# Patient Record
Sex: Female | Born: 1935
Health system: Southern US, Community
[De-identification: ages and names within clinical notes are randomized; demographics above are authoritative.]

## PROBLEM LIST (undated history)

## (undated) DIAGNOSIS — S22000A Wedge compression fracture of unspecified thoracic vertebra, initial encounter for closed fracture: Secondary | ICD-10-CM

## (undated) DIAGNOSIS — Z86010 Personal history of colonic polyps: Secondary | ICD-10-CM

## (undated) DIAGNOSIS — S2231XA Fracture of one rib, right side, initial encounter for closed fracture: Secondary | ICD-10-CM

## (undated) DIAGNOSIS — H34232 Retinal artery branch occlusion, left eye: Secondary | ICD-10-CM

## (undated) DIAGNOSIS — I5042 Chronic combined systolic (congestive) and diastolic (congestive) heart failure: Secondary | ICD-10-CM

## (undated) DIAGNOSIS — Z950 Presence of cardiac pacemaker: Secondary | ICD-10-CM

## (undated) DIAGNOSIS — S72001A Fracture of unspecified part of neck of right femur, initial encounter for closed fracture: Secondary | ICD-10-CM

## (undated) DIAGNOSIS — S92309A Fracture of unspecified metatarsal bone(s), unspecified foot, initial encounter for closed fracture: Secondary | ICD-10-CM

## (undated) DIAGNOSIS — I4891 Unspecified atrial fibrillation: Secondary | ICD-10-CM

## (undated) DIAGNOSIS — E46 Unspecified protein-calorie malnutrition: Secondary | ICD-10-CM

## (undated) DIAGNOSIS — E785 Hyperlipidemia, unspecified: Secondary | ICD-10-CM

## (undated) DIAGNOSIS — I639 Cerebral infarction, unspecified: Secondary | ICD-10-CM

## (undated) DIAGNOSIS — I495 Sick sinus syndrome: Secondary | ICD-10-CM

## (undated) DIAGNOSIS — D638 Anemia in other chronic diseases classified elsewhere: Secondary | ICD-10-CM

## (undated) DIAGNOSIS — Z8679 Personal history of other diseases of the circulatory system: Secondary | ICD-10-CM

## (undated) DIAGNOSIS — H9190 Unspecified hearing loss, unspecified ear: Secondary | ICD-10-CM

## (undated) DIAGNOSIS — I480 Paroxysmal atrial fibrillation: Secondary | ICD-10-CM

## (undated) DIAGNOSIS — M5416 Radiculopathy, lumbar region: Secondary | ICD-10-CM

## (undated) DIAGNOSIS — S32511A Fracture of superior rim of right pubis, initial encounter for closed fracture: Secondary | ICD-10-CM

## (undated) DIAGNOSIS — Z860101 Personal history of adenomatous and serrated colon polyps: Secondary | ICD-10-CM

## (undated) DIAGNOSIS — N261 Atrophy of kidney (terminal): Secondary | ICD-10-CM

## (undated) DIAGNOSIS — M8580 Other specified disorders of bone density and structure, unspecified site: Secondary | ICD-10-CM

## (undated) DIAGNOSIS — M48061 Spinal stenosis, lumbar region without neurogenic claudication: Secondary | ICD-10-CM

## (undated) DIAGNOSIS — N186 End stage renal disease: Secondary | ICD-10-CM

## (undated) DIAGNOSIS — I10 Essential (primary) hypertension: Secondary | ICD-10-CM

## (undated) DIAGNOSIS — M546 Pain in thoracic spine: Secondary | ICD-10-CM

## (undated) DIAGNOSIS — Z87442 Personal history of urinary calculi: Secondary | ICD-10-CM

## (undated) DIAGNOSIS — J9611 Chronic respiratory failure with hypoxia: Secondary | ICD-10-CM

## (undated) DIAGNOSIS — R5381 Other malaise: Secondary | ICD-10-CM

## (undated) DIAGNOSIS — I428 Other cardiomyopathies: Secondary | ICD-10-CM

## (undated) DIAGNOSIS — Z992 Dependence on renal dialysis: Secondary | ICD-10-CM

## (undated) DIAGNOSIS — M47816 Spondylosis without myelopathy or radiculopathy, lumbar region: Secondary | ICD-10-CM

## (undated) HISTORY — DX: Other specified disorders of bone density and structure, unspecified site: M85.80

## (undated) HISTORY — DX: Spinal stenosis, lumbar region without neurogenic claudication: M48.061

## (undated) HISTORY — DX: Other cardiomyopathies: I42.8

## (undated) HISTORY — DX: Chronic combined systolic (congestive) and diastolic (congestive) heart failure: I50.42

## (undated) HISTORY — DX: Paroxysmal atrial fibrillation: I48.0

## (undated) HISTORY — DX: Wedge compression fracture of unspecified thoracic vertebra, initial encounter for closed fracture: S22.000A

## (undated) HISTORY — PX: TRANSTHORACIC ECHOCARDIOGRAM: SHX275

## (undated) HISTORY — DX: End stage renal disease: N18.6

## (undated) HISTORY — PX: APPENDECTOMY: SHX54

## (undated) HISTORY — PX: EYE SURGERY: SHX253

## (undated) HISTORY — DX: Radiculopathy, lumbar region: M54.16

## (undated) HISTORY — DX: Fracture of one rib, right side, initial encounter for closed fracture: S22.31XA

## (undated) HISTORY — DX: Retinal artery branch occlusion, left eye: H34.232

## (undated) HISTORY — DX: Anemia in other chronic diseases classified elsewhere: D63.8

## (undated) HISTORY — DX: Fracture of unspecified metatarsal bone(s), unspecified foot, initial encounter for closed fracture: S92.309A

## (undated) HISTORY — DX: Pain in thoracic spine: M54.6

## (undated) HISTORY — DX: Dependence on renal dialysis: Z99.2

## (undated) HISTORY — DX: Chronic respiratory failure with hypoxia: J96.11

## (undated) HISTORY — PX: TONSILLECTOMY: SUR1361

## (undated) HISTORY — DX: Personal history of colonic polyps: Z86.010

## (undated) HISTORY — DX: Unspecified protein-calorie malnutrition: E46

## (undated) HISTORY — DX: Personal history of adenomatous and serrated colon polyps: Z86.0101

## (undated) HISTORY — DX: Other malaise: R53.81

## (undated) HISTORY — DX: Atrophy of kidney (terminal): N26.1

## (undated) HISTORY — DX: Sick sinus syndrome: I49.5

## (undated) HISTORY — DX: Personal history of other diseases of the circulatory system: Z86.79

---
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
## 2000-03-02 ENCOUNTER — Encounter: Payer: Self-pay | Admitting: *Deleted

## 2000-03-02 ENCOUNTER — Encounter: Admission: RE | Admit: 2000-03-02 | Discharge: 2000-03-02 | Payer: Self-pay | Admitting: *Deleted

## 2001-02-14 ENCOUNTER — Encounter: Payer: Self-pay | Admitting: *Deleted

## 2001-02-14 ENCOUNTER — Encounter: Admission: RE | Admit: 2001-02-14 | Discharge: 2001-02-14 | Payer: Self-pay | Admitting: *Deleted

## 2003-01-12 ENCOUNTER — Encounter: Payer: Self-pay | Admitting: *Deleted

## 2003-01-12 ENCOUNTER — Encounter: Admission: RE | Admit: 2003-01-12 | Discharge: 2003-01-12 | Payer: Self-pay | Admitting: *Deleted

## 2003-11-26 ENCOUNTER — Ambulatory Visit (HOSPITAL_COMMUNITY): Admission: RE | Admit: 2003-11-26 | Discharge: 2003-11-26 | Payer: Self-pay | Admitting: Specialist

## 2004-04-09 ENCOUNTER — Encounter: Admission: RE | Admit: 2004-04-09 | Discharge: 2004-04-09 | Payer: Self-pay | Admitting: *Deleted

## 2004-04-16 ENCOUNTER — Other Ambulatory Visit: Admission: RE | Admit: 2004-04-16 | Discharge: 2004-04-16 | Payer: Self-pay | Admitting: *Deleted

## 2004-04-25 ENCOUNTER — Encounter: Admission: RE | Admit: 2004-04-25 | Discharge: 2004-04-25 | Payer: Self-pay | Admitting: *Deleted

## 2005-01-26 ENCOUNTER — Ambulatory Visit (HOSPITAL_COMMUNITY): Admission: RE | Admit: 2005-01-26 | Discharge: 2005-01-26 | Payer: Self-pay | Admitting: *Deleted

## 2005-01-26 ENCOUNTER — Encounter (INDEPENDENT_AMBULATORY_CARE_PROVIDER_SITE_OTHER): Payer: Self-pay | Admitting: Specialist

## 2005-06-19 ENCOUNTER — Encounter: Admission: RE | Admit: 2005-06-19 | Discharge: 2005-06-19 | Payer: Self-pay | Admitting: *Deleted

## 2006-07-27 ENCOUNTER — Encounter: Admission: RE | Admit: 2006-07-27 | Discharge: 2006-07-27 | Payer: Self-pay | Admitting: *Deleted

## 2007-08-05 ENCOUNTER — Encounter: Admission: RE | Admit: 2007-08-05 | Discharge: 2007-08-05 | Payer: Self-pay | Admitting: Family Medicine

## 2007-08-12 ENCOUNTER — Other Ambulatory Visit: Admission: RE | Admit: 2007-08-12 | Discharge: 2007-08-12 | Payer: Self-pay | Admitting: Family Medicine

## 2007-08-24 ENCOUNTER — Encounter: Admission: RE | Admit: 2007-08-24 | Discharge: 2007-08-24 | Payer: Self-pay | Admitting: Family Medicine

## 2008-09-14 ENCOUNTER — Encounter: Admission: RE | Admit: 2008-09-14 | Discharge: 2008-09-14 | Payer: Self-pay | Admitting: Family Medicine

## 2009-10-11 ENCOUNTER — Encounter: Admission: RE | Admit: 2009-10-11 | Discharge: 2009-10-11 | Payer: Self-pay | Admitting: Family Medicine

## 2009-11-22 ENCOUNTER — Encounter: Admission: RE | Admit: 2009-11-22 | Discharge: 2009-11-22 | Payer: Self-pay | Admitting: Family Medicine

## 2010-05-18 DIAGNOSIS — M5416 Radiculopathy, lumbar region: Secondary | ICD-10-CM

## 2010-05-18 HISTORY — DX: Radiculopathy, lumbar region: M54.16

## 2010-06-07 ENCOUNTER — Encounter: Payer: Self-pay | Admitting: *Deleted

## 2010-09-01 ENCOUNTER — Other Ambulatory Visit: Payer: Self-pay | Admitting: Family Medicine

## 2010-09-01 DIAGNOSIS — Z1231 Encounter for screening mammogram for malignant neoplasm of breast: Secondary | ICD-10-CM

## 2010-10-14 ENCOUNTER — Ambulatory Visit
Admission: RE | Admit: 2010-10-14 | Discharge: 2010-10-14 | Disposition: A | Discharge status: Home / Self Care | Payer: BC Managed Care – PPO | Source: Ambulatory Visit | Attending: Family Medicine | Admitting: Family Medicine

## 2010-10-14 DIAGNOSIS — Z1231 Encounter for screening mammogram for malignant neoplasm of breast: Secondary | ICD-10-CM

## 2011-02-04 DIAGNOSIS — M5416 Radiculopathy, lumbar region: Secondary | ICD-10-CM | POA: Insufficient documentation

## 2011-11-17 ENCOUNTER — Other Ambulatory Visit: Payer: Self-pay | Admitting: Internal Medicine

## 2011-11-17 DIAGNOSIS — Z1231 Encounter for screening mammogram for malignant neoplasm of breast: Secondary | ICD-10-CM

## 2011-12-02 ENCOUNTER — Ambulatory Visit
Admission: RE | Admit: 2011-12-02 | Discharge: 2011-12-02 | Disposition: A | Discharge status: Home / Self Care | Payer: BC Managed Care – PPO | Source: Ambulatory Visit | Attending: Internal Medicine | Admitting: Internal Medicine

## 2011-12-02 DIAGNOSIS — Z1231 Encounter for screening mammogram for malignant neoplasm of breast: Secondary | ICD-10-CM

## 2012-05-18 DIAGNOSIS — M8580 Other specified disorders of bone density and structure, unspecified site: Secondary | ICD-10-CM

## 2012-05-18 HISTORY — DX: Other specified disorders of bone density and structure, unspecified site: M85.80

## 2012-11-21 ENCOUNTER — Other Ambulatory Visit: Payer: Self-pay

## 2012-11-21 DIAGNOSIS — Z1231 Encounter for screening mammogram for malignant neoplasm of breast: Secondary | ICD-10-CM

## 2012-12-19 ENCOUNTER — Ambulatory Visit
Admission: RE | Admit: 2012-12-19 | Discharge: 2012-12-19 | Disposition: A | Discharge status: Home / Self Care | Payer: BC Managed Care – PPO | Source: Ambulatory Visit

## 2012-12-19 DIAGNOSIS — Z1231 Encounter for screening mammogram for malignant neoplasm of breast: Secondary | ICD-10-CM

## 2013-05-18 HISTORY — PX: COLONOSCOPY: SHX174

## 2013-07-10 DIAGNOSIS — K635 Polyp of colon: Secondary | ICD-10-CM | POA: Insufficient documentation

## 2013-12-25 ENCOUNTER — Other Ambulatory Visit: Payer: Self-pay

## 2013-12-25 DIAGNOSIS — Z1231 Encounter for screening mammogram for malignant neoplasm of breast: Secondary | ICD-10-CM

## 2013-12-27 ENCOUNTER — Ambulatory Visit
Admission: RE | Admit: 2013-12-27 | Discharge: 2013-12-27 | Disposition: A | Discharge status: Home / Self Care | Payer: No Typology Code available for payment source | Source: Ambulatory Visit

## 2013-12-27 ENCOUNTER — Encounter (INDEPENDENT_AMBULATORY_CARE_PROVIDER_SITE_OTHER): Payer: Self-pay

## 2013-12-27 DIAGNOSIS — Z1231 Encounter for screening mammogram for malignant neoplasm of breast: Secondary | ICD-10-CM

## 2014-01-08 DIAGNOSIS — I059 Rheumatic mitral valve disease, unspecified: Secondary | ICD-10-CM | POA: Insufficient documentation

## 2014-10-17 DIAGNOSIS — Z8679 Personal history of other diseases of the circulatory system: Secondary | ICD-10-CM

## 2014-10-17 HISTORY — DX: Personal history of other diseases of the circulatory system: Z86.79

## 2014-12-05 ENCOUNTER — Other Ambulatory Visit: Payer: Self-pay

## 2014-12-05 DIAGNOSIS — Z1231 Encounter for screening mammogram for malignant neoplasm of breast: Secondary | ICD-10-CM

## 2015-01-02 ENCOUNTER — Inpatient Hospital Stay: Admission: RE | Admit: 2015-01-02 | Payer: No Typology Code available for payment source | Source: Ambulatory Visit

## 2015-01-02 ENCOUNTER — Ambulatory Visit
Admission: RE | Admit: 2015-01-02 | Discharge: 2015-01-02 | Disposition: A | Discharge status: Home / Self Care | Payer: No Typology Code available for payment source | Source: Ambulatory Visit

## 2015-01-02 DIAGNOSIS — Z1231 Encounter for screening mammogram for malignant neoplasm of breast: Secondary | ICD-10-CM

## 2015-05-19 DIAGNOSIS — H34232 Retinal artery branch occlusion, left eye: Secondary | ICD-10-CM

## 2015-05-19 HISTORY — DX: Retinal artery branch occlusion, left eye: H34.232

## 2016-01-13 ENCOUNTER — Other Ambulatory Visit: Payer: Self-pay | Admitting: Family Medicine

## 2016-01-13 DIAGNOSIS — Z1231 Encounter for screening mammogram for malignant neoplasm of breast: Secondary | ICD-10-CM

## 2016-01-17 DIAGNOSIS — I639 Cerebral infarction, unspecified: Secondary | ICD-10-CM

## 2016-01-17 HISTORY — DX: Cerebral infarction, unspecified: I63.9

## 2016-01-24 ENCOUNTER — Ambulatory Visit
Admission: RE | Admit: 2016-01-24 | Discharge: 2016-01-24 | Disposition: A | Discharge status: Home / Self Care | Payer: Medicare Other | Source: Ambulatory Visit | Attending: Family Medicine | Admitting: Family Medicine

## 2016-01-24 DIAGNOSIS — Z1231 Encounter for screening mammogram for malignant neoplasm of breast: Secondary | ICD-10-CM

## 2016-01-27 DIAGNOSIS — R269 Unspecified abnormalities of gait and mobility: Secondary | ICD-10-CM | POA: Insufficient documentation

## 2016-01-28 ENCOUNTER — Emergency Department (HOSPITAL_COMMUNITY): Payer: Medicare Other

## 2016-01-28 ENCOUNTER — Encounter (HOSPITAL_COMMUNITY): Payer: Self-pay | Admitting: Emergency Medicine

## 2016-01-28 ENCOUNTER — Inpatient Hospital Stay (HOSPITAL_COMMUNITY)
Admission: EM | Admit: 2016-01-28 | Discharge: 2016-02-01 | DRG: 065 | Disposition: A | Discharge status: Home / Self Care | Payer: Medicare Other | Attending: Internal Medicine | Admitting: Internal Medicine

## 2016-01-28 DIAGNOSIS — R079 Chest pain, unspecified: Secondary | ICD-10-CM | POA: Diagnosis not present

## 2016-01-28 DIAGNOSIS — R0789 Other chest pain: Secondary | ICD-10-CM | POA: Diagnosis not present

## 2016-01-28 DIAGNOSIS — R001 Bradycardia, unspecified: Secondary | ICD-10-CM | POA: Diagnosis present

## 2016-01-28 DIAGNOSIS — I639 Cerebral infarction, unspecified: Secondary | ICD-10-CM | POA: Diagnosis not present

## 2016-01-28 DIAGNOSIS — I700 Atherosclerosis of aorta: Secondary | ICD-10-CM | POA: Diagnosis present

## 2016-01-28 DIAGNOSIS — H34232 Retinal artery branch occlusion, left eye: Secondary | ICD-10-CM | POA: Diagnosis not present

## 2016-01-28 DIAGNOSIS — Z7982 Long term (current) use of aspirin: Secondary | ICD-10-CM

## 2016-01-28 DIAGNOSIS — E669 Obesity, unspecified: Secondary | ICD-10-CM | POA: Diagnosis present

## 2016-01-28 DIAGNOSIS — R0602 Shortness of breath: Secondary | ICD-10-CM

## 2016-01-28 DIAGNOSIS — I480 Paroxysmal atrial fibrillation: Secondary | ICD-10-CM | POA: Diagnosis present

## 2016-01-28 DIAGNOSIS — Z8679 Personal history of other diseases of the circulatory system: Secondary | ICD-10-CM

## 2016-01-28 DIAGNOSIS — N184 Chronic kidney disease, stage 4 (severe): Secondary | ICD-10-CM | POA: Diagnosis present

## 2016-01-28 DIAGNOSIS — E785 Hyperlipidemia, unspecified: Secondary | ICD-10-CM | POA: Diagnosis present

## 2016-01-28 DIAGNOSIS — R0600 Dyspnea, unspecified: Secondary | ICD-10-CM

## 2016-01-28 DIAGNOSIS — N182 Chronic kidney disease, stage 2 (mild): Secondary | ICD-10-CM | POA: Diagnosis not present

## 2016-01-28 DIAGNOSIS — Z8673 Personal history of transient ischemic attack (TIA), and cerebral infarction without residual deficits: Secondary | ICD-10-CM | POA: Diagnosis present

## 2016-01-28 DIAGNOSIS — H34239 Retinal artery branch occlusion, unspecified eye: Secondary | ICD-10-CM | POA: Diagnosis present

## 2016-01-28 DIAGNOSIS — R519 Headache, unspecified: Secondary | ICD-10-CM | POA: Diagnosis present

## 2016-01-28 DIAGNOSIS — I10 Essential (primary) hypertension: Secondary | ICD-10-CM | POA: Diagnosis present

## 2016-01-28 DIAGNOSIS — N179 Acute kidney failure, unspecified: Secondary | ICD-10-CM | POA: Diagnosis present

## 2016-01-28 DIAGNOSIS — Z881 Allergy status to other antibiotic agents status: Secondary | ICD-10-CM

## 2016-01-28 DIAGNOSIS — Z79899 Other long term (current) drug therapy: Secondary | ICD-10-CM

## 2016-01-28 DIAGNOSIS — Z8249 Family history of ischemic heart disease and other diseases of the circulatory system: Secondary | ICD-10-CM

## 2016-01-28 DIAGNOSIS — Z91048 Other nonmedicinal substance allergy status: Secondary | ICD-10-CM

## 2016-01-28 DIAGNOSIS — I129 Hypertensive chronic kidney disease with stage 1 through stage 4 chronic kidney disease, or unspecified chronic kidney disease: Secondary | ICD-10-CM | POA: Diagnosis present

## 2016-01-28 DIAGNOSIS — N270 Small kidney, unilateral: Secondary | ICD-10-CM | POA: Diagnosis present

## 2016-01-28 DIAGNOSIS — T790XXA Air embolism (traumatic), initial encounter: Secondary | ICD-10-CM

## 2016-01-28 DIAGNOSIS — R51 Headache: Secondary | ICD-10-CM

## 2016-01-28 DIAGNOSIS — Z6830 Body mass index (BMI) 30.0-30.9, adult: Secondary | ICD-10-CM

## 2016-01-28 HISTORY — DX: Cerebral infarction, unspecified: I63.9

## 2016-01-28 HISTORY — DX: Essential (primary) hypertension: I10

## 2016-01-28 HISTORY — DX: Unspecified atrial fibrillation: I48.91

## 2016-01-28 HISTORY — DX: Hyperlipidemia, unspecified: E78.5

## 2016-01-28 LAB — PROTIME-INR
INR: 0.91
PROTHROMBIN TIME: 12.2 s (ref 11.4–15.2)

## 2016-01-28 LAB — COMPREHENSIVE METABOLIC PANEL
ALBUMIN: 4.1 g/dL (ref 3.5–5.0)
ALK PHOS: 43 U/L (ref 38–126)
ALT: 15 U/L (ref 14–54)
ANION GAP: 10 (ref 5–15)
AST: 23 U/L (ref 15–41)
BUN: 33 mg/dL — ABNORMAL HIGH (ref 6–20)
CHLORIDE: 103 mmol/L (ref 101–111)
CO2: 29 mmol/L (ref 22–32)
Calcium: 10.1 mg/dL (ref 8.9–10.3)
Creatinine, Ser: 1.32 mg/dL — ABNORMAL HIGH (ref 0.44–1.00)
GFR calc non Af Amer: 37 mL/min — ABNORMAL LOW (ref >60)
GFR, EST AFRICAN AMERICAN: 43 mL/min — AB (ref >60)
GLUCOSE: 99 mg/dL (ref 65–99)
POTASSIUM: 3.7 mmol/L (ref 3.5–5.1)
SODIUM: 142 mmol/L (ref 135–145)
Total Bilirubin: 0.7 mg/dL (ref 0.3–1.2)
Total Protein: 7.3 g/dL (ref 6.5–8.1)

## 2016-01-28 LAB — I-STAT CHEM 8, ED
BUN: 35 mg/dL — AB (ref 6–20)
CHLORIDE: 102 mmol/L (ref 101–111)
Calcium, Ion: 1.2 mmol/L (ref 1.15–1.40)
Creatinine, Ser: 1.4 mg/dL — ABNORMAL HIGH (ref 0.44–1.00)
Glucose, Bld: 98 mg/dL (ref 65–99)
HEMATOCRIT: 42 % (ref 36.0–46.0)
Hemoglobin: 14.3 g/dL (ref 12.0–15.0)
POTASSIUM: 3.7 mmol/L (ref 3.5–5.1)
SODIUM: 141 mmol/L (ref 135–145)
TCO2: 29 mmol/L (ref 0–100)

## 2016-01-28 LAB — CBC
HCT: 43 % (ref 36.0–46.0)
Hemoglobin: 14 g/dL (ref 12.0–15.0)
MCH: 30.8 pg (ref 26.0–34.0)
MCHC: 32.6 g/dL (ref 30.0–36.0)
MCV: 94.7 fL (ref 78.0–100.0)
PLATELETS: 189 10*3/uL (ref 150–400)
RBC: 4.54 MIL/uL (ref 3.87–5.11)
RDW: 13.4 % (ref 11.5–15.5)
WBC: 7.4 10*3/uL (ref 4.0–10.5)

## 2016-01-28 LAB — DIFFERENTIAL
BASOS PCT: 0 %
Basophils Absolute: 0 10*3/uL (ref 0.0–0.1)
EOS ABS: 0.2 10*3/uL (ref 0.0–0.7)
EOS PCT: 3 %
LYMPHS PCT: 24 %
Lymphs Abs: 1.7 10*3/uL (ref 0.7–4.0)
MONO ABS: 0.9 10*3/uL (ref 0.1–1.0)
Monocytes Relative: 13 %
NEUTROS PCT: 60 %
Neutro Abs: 4.5 10*3/uL (ref 1.7–7.7)

## 2016-01-28 LAB — APTT: aPTT: 36 seconds (ref 24–36)

## 2016-01-28 LAB — I-STAT TROPONIN, ED: Troponin i, poc: 0.03 ng/mL (ref 0.00–0.08)

## 2016-01-28 LAB — SEDIMENTATION RATE: Sed Rate: 22 mm/hr (ref 0–22)

## 2016-01-28 MED ORDER — HEPARIN SODIUM (PORCINE) 5000 UNIT/ML IJ SOLN
5000.0000 [IU] | Freq: Three times a day (TID) | INTRAMUSCULAR | Status: DC
  Administered 2016-01-28 – 2016-01-30 (×5): 5000 [IU] via SUBCUTANEOUS
  Filled 2016-01-28 (×5): qty 1

## 2016-01-28 MED ORDER — HYDRALAZINE HCL 20 MG/ML IJ SOLN: 5.0000 mg | INTRAMUSCULAR | Status: DC | PRN

## 2016-01-28 MED ORDER — DICYCLOMINE HCL 10 MG PO CAPS
10.0000 mg | ORAL_CAPSULE | Freq: Four times a day (QID) | ORAL | Status: DC | PRN
  Filled 2016-01-28: qty 1

## 2016-01-28 MED ORDER — ONDANSETRON HCL 4 MG/2ML IJ SOLN
4.0000 mg | Freq: Once | INTRAMUSCULAR | Status: AC
  Administered 2016-01-28: 4 mg via INTRAVENOUS
  Filled 2016-01-28: qty 2

## 2016-01-28 MED ORDER — STROKE: EARLY STAGES OF RECOVERY BOOK
Freq: Once | Status: AC
  Administered 2016-01-29: 12:00:00
  Filled 2016-01-28 (×2): qty 1

## 2016-01-28 MED ORDER — MORPHINE SULFATE (PF) 2 MG/ML IV SOLN
2.0000 mg | Freq: Once | INTRAVENOUS | Status: AC
  Administered 2016-01-28: 1 mg via INTRAVENOUS
  Filled 2016-01-28: qty 1

## 2016-01-28 MED ORDER — MAGNESIUM GLUCONATE 500 MG PO TABS
500.0000 mg | ORAL_TABLET | Freq: Every day | ORAL | Status: DC
  Administered 2016-01-29 – 2016-02-01 (×4): 500 mg via ORAL
  Filled 2016-01-28 (×4): qty 1

## 2016-01-28 MED ORDER — HYDRALAZINE HCL 20 MG/ML IJ SOLN
10.0000 mg | Freq: Once | INTRAMUSCULAR | Status: AC
  Administered 2016-01-28: 10 mg via INTRAVENOUS
  Filled 2016-01-28: qty 1

## 2016-01-28 MED ORDER — SENNOSIDES-DOCUSATE SODIUM 8.6-50 MG PO TABS: 1.0000 | ORAL_TABLET | Freq: Every evening | ORAL | Status: DC | PRN

## 2016-01-28 MED ORDER — SODIUM CHLORIDE 0.9 % IV SOLN
INTRAVENOUS | Status: AC
  Administered 2016-01-28: 20:00:00 via INTRAVENOUS

## 2016-01-28 MED ORDER — FLUTICASONE PROPIONATE 50 MCG/ACT NA SUSP
2.0000 | Freq: Every day | NASAL | Status: DC
  Filled 2016-01-28 (×3): qty 16

## 2016-01-28 MED ORDER — HYDROMORPHONE HCL 1 MG/ML IJ SOLN
0.5000 mg | INTRAMUSCULAR | Status: DC | PRN
  Administered 2016-01-28 – 2016-01-31 (×7): 0.5 mg via INTRAVENOUS
  Filled 2016-01-28 (×7): qty 1

## 2016-01-28 MED ORDER — ASPIRIN 300 MG RE SUPP: 300.0000 mg | Freq: Every day | RECTAL | Status: DC

## 2016-01-28 MED ORDER — CALCIUM CARBONATE 600 MG PO TABS: 600.0000 mg | ORAL_TABLET | Freq: Two times a day (BID) | ORAL | Status: DC

## 2016-01-28 MED ORDER — ASPIRIN 325 MG PO TABS
325.0000 mg | ORAL_TABLET | Freq: Every day | ORAL | Status: DC
  Administered 2016-01-28 – 2016-01-30 (×3): 325 mg via ORAL
  Filled 2016-01-28 (×3): qty 1

## 2016-01-28 MED ORDER — CALCIUM CARBONATE 1250 (500 CA) MG PO TABS
1.0000 | ORAL_TABLET | Freq: Two times a day (BID) | ORAL | Status: DC
  Administered 2016-01-29 – 2016-02-01 (×6): 500 mg via ORAL
  Filled 2016-01-28 (×6): qty 1

## 2016-01-28 MED ORDER — VITAMIN C 500 MG PO TABS
500.0000 mg | ORAL_TABLET | Freq: Every day | ORAL | Status: DC
  Administered 2016-01-29 – 2016-02-01 (×3): 500 mg via ORAL
  Filled 2016-01-28 (×4): qty 1

## 2016-01-28 MED ORDER — PRAVASTATIN SODIUM 40 MG PO TABS
40.0000 mg | ORAL_TABLET | Freq: Every evening | ORAL | Status: DC
  Administered 2016-01-28 – 2016-01-31 (×4): 40 mg via ORAL
  Filled 2016-01-28 (×4): qty 1

## 2016-01-28 MED ORDER — ADULT MULTIVITAMIN W/MINERALS CH
1.0000 | ORAL_TABLET | Freq: Every day | ORAL | Status: DC
  Administered 2016-01-29 – 2016-02-01 (×4): 1 via ORAL
  Filled 2016-01-28 (×4): qty 1

## 2016-01-28 NOTE — ED Notes (Signed)
Dr. James at bedside  

## 2016-01-28 NOTE — ED Notes (Signed)
Pt to CT

## 2016-01-28 NOTE — ED Notes (Signed)
Pt stable for transport to inpatient unit. Pt still reporting HA. A&OX4. Pt passed swallow screen. Last neuro check was performed by this RN at [facility]. Last NIH was "1" due to "right side feeling more sharp" to right arm and leg. Last BP was 172/62 at 1915, HR-55.

## 2016-01-28 NOTE — ED Notes (Addendum)
This RN called xray to see when portable xray could be performed and was told tech is on the way now to do it.

## 2016-01-28 NOTE — ED Provider Notes (Signed)
Since seen and evaluated. Discussed with Dr.Mikell.  Patient presents hypertensive. Recent diagnosis of branch retinal artery occlusion per ophthalmology today. Has had headache primarily left-sided since 1 week ago. Had normal CT and normal MRI last week. Normal CT tonight. Pressures have been normal at home in the 130s. However presents markedly hypertensive 847 systolic today. Has continued left visual field deficits through left eye. Reports normal vision to right eye. Patient has a history of paroxysmal A. fib. Is in current sinus rhythm.  No peripheral neurological deficits. No pronator drift. Normal mental status. Conversant. No aphasia.  Discussion: Branch retinal artery likely embolic phenomenon. Given her hydralazine for blood pressure control here. Will need further evaluation and investigation for possible source for emboli. Patient is supposed to anticoagulation currently.  She was given Eloquis after bout of A. fib several years ago. Had syncope that night it hit her head had small hemorrhagic contusion and is not been on any coagulation since. Agree with hydralazine for blood pressure control. Agree with admission for further evaluation.       Tanna Furry, MD 01/28/16 [phone]

## 2016-01-28 NOTE — ED Notes (Signed)
Hold on visual acuity test at this time as patient has both eyes dilated at her optometrist's office just prior to arrival. Pupils still dilated at this time.

## 2016-01-28 NOTE — ED Notes (Signed)
This RN informed Dr. Canary Brim of pt's BP and symptoms. No code stroke called at this time. Pt to be monitored closely in waiting room. No further orders at this time

## 2016-01-28 NOTE — ED Provider Notes (Signed)
[facility] DEPT Provider Note   CSN: [id number] Arrival date & time: 01/28/16  1545     History   Chief Complaint Chief Complaint  Patient presents with  . Hypertension    HPI Valerie Terrell is a 82 y.o. female with pmhx significant PA and HTN presenting for headache, nausea and dizziness. Patient states that last Tuesday she had a headache and vision changes. The next morning she was evaluated by her optometrist in the clinic. She then saw them again on Friday where they did an MRI which was negative for any significant abnormalities. Patient continued to have headaches, nausea, and dizziness. His headaches were migratory and intermittent. Patient used Tylenol which seemed to slightly improve headaches. Currently with a left-sided frontal headache. No history of headaches. Indicates that the headache she had on Tuesday would be considered significantly more severe than the current headache that she has. Also indicates that she has had left-sided peripheral vision changes, with diminished vision. Vision changes started last  Wednesday per patient. Today she was seen in the optometrist office and they diagnosed her with a retinal artery branch occlusion of the left eye. She was also noted to have a blood pressure of 210/100 in the optometrist office. She states that she did take all her blood pressure medications this morning (meds norvasc, hctz-atenlol, losartan), and is well controlled.Patient does have a history of paroxsymal atrial fibrillation however is not anticoagulated. This is due to the fact that when she was placed on Xarelto, she developed a small subarachnoid hemorrhage following a fall.   HPI  Past Medical History:  Diagnosis Date  . Atrial fibrillation ([place])   . Hyperlipidemia   . Hypertension   . Small kidney    left    Patient Active Problem List   Diagnosis Date Noted  . AKI (acute kidney injury) ([place]) 01/28/2016  . Atrial fibrillation ([place]) 01/28/2016  .  Hypertension 01/28/2016  . Retinal artery branch occlusion 01/28/2016  . Headache 01/28/2016  . Stroke ([place]) 01/28/2016  . 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 Personal history of subarachnoid hemorrhage 01/28/2016  . CKD (chronic kidney disease), stage II 01/28/2016    Past Surgical History:  Procedure Laterality Date  . CESAREAN SECTION    . TONSILLECTOMY      OB History    No data available       Home Medications    Prior to Admission medications   Medication Sig Start Date End Date Taking? Authorizing Provider  amLODipine (NORVASC) 5 MG tablet Take 7.5 mg by mouth every morning.  01/27/16  Yes Historical Provider, MD  aspirin 81 MG chewable tablet Chew 81 mg by mouth daily.   Yes Historical Provider, MD  atenolol-chlorthalidone (TENORETIC) 100-25 MG tablet Take 1 tablet by mouth every morning. 01/27/16  Yes Historical Provider, MD  calcium carbonate (OS-CAL) 600 MG TABS tablet Take 600 mg by mouth 2 (two) times daily.   Yes Historical Provider, MD  dicyclomine (BENTYL) 10 MG capsule Take 10 mg by mouth 4 (four) times daily as needed. 01/27/16 02/10/16 Yes Historical Provider, MD  doxycycline (VIBRAMYCIN) 100 MG capsule Take 100 mg by mouth 2 (two) times daily. 01/22/16  Yes Historical Provider, MD  losartan (COZAAR) 100 MG tablet Take 100 mg by mouth daily. 01/27/16  Yes Historical Provider, MD  Magnesium Gluconate (MAGNESIUM 27) 500 (27 Mg) MG TABS Take 500 mg by mouth daily.   Yes Historical Provider, MD  Multiple Vitamin (MULTIVITAMIN) tablet Take  1 tablet by mouth daily.   Yes Historical Provider, MD  pravastatin (PRAVACHOL) 40 MG tablet Take 40 mg by mouth every evening. 07/16/15  Yes Historical Provider, MD  vitamin C (ASCORBIC ACID) 500 MG tablet Take 500 mg by mouth daily.   Yes Historical Provider, MD    Family History Family History  Problem Relation Age of Onset  . Sudden Cardiac Death Neg Hx     Social History Social History  Substance Use  Topics  . Smoking status: Not on file  . Smokeless tobacco: Never Used  . Alcohol use Yes     Comment: wine     Allergies   Sulfa antibiotics and Nickel   Review of Systems Review of Systems  Constitutional: Negative for fever.  HENT: Negative for congestion.   Eyes: Positive for visual disturbance. Negative for pain and redness.  Respiratory: Negative for cough and shortness of breath.   Cardiovascular: Negative for chest pain.  Gastrointestinal: Negative for abdominal pain, nausea and vomiting.  Genitourinary: Negative for dysuria and urgency.  Musculoskeletal: Negative for neck pain and neck stiffness.  Neurological: Positive for dizziness, speech difficulty and headaches. Negative for weakness and numbness.     Physical Exam Updated Vital Signs BP 178/68   Pulse (!) 57   Temp 98.1 F (36.7 C)   Resp 20   Wt 68 kg   SpO2 98%   Physical Exam  Constitutional: She is oriented to person, place, and time. She appears well-developed and well-nourished.  HENT:  Head: Normocephalic and atraumatic.  Right Ear: External ear normal.  Left Ear: External ear normal.  Nose: Nose normal.  Mouth/Throat: Oropharynx is clear and moist.  Eyes: Conjunctivae and EOM are normal. Pupils are equal, round, and reactive to light.  Neck: Normal range of motion. Neck supple.  Cardiovascular: Normal rate, regular rhythm, normal heart sounds and intact distal pulses.   Pulmonary/Chest: Effort normal and breath sounds normal.  Abdominal: Soft. Bowel sounds are normal.  Musculoskeletal: Normal range of motion.  Neurological: She is alert and oriented to person, place, and time. She has normal reflexes.  Skin: Skin is warm.     ED Treatments / Results  Labs (all labs ordered are listed, but only abnormal results are displayed) Labs Reviewed  COMPREHENSIVE METABOLIC PANEL - Abnormal; Notable for the following:       Result Value   BUN 33 (*)    Creatinine, Ser 1.32 (*)    GFR calc non  Af Amer 37 (*)    GFR calc Af Amer 43 (*)    All other components within normal limits  I-STAT CHEM 8, ED - Abnormal; Notable for the following:    BUN 35 (*)    Creatinine, Ser 1.40 (*)    All other components within normal limits  PROTIME-INR  APTT  CBC  DIFFERENTIAL  HEMOGLOBIN A1C  LIPID PANEL  SEDIMENTATION RATE  I-STAT TROPOININ, ED  CBG MONITORING, ED    EKG  EKG Interpretation None       Radiology Ct Head Wo Contrast  Result Date: 01/28/2016 CLINICAL DATA:  Hypertension and ocular occlusion. Headache and dizziness and nausea. EXAM: CT HEAD WITHOUT CONTRAST TECHNIQUE: Contiguous axial images were obtained from the base of the skull through the vertex without intravenous contrast. COMPARISON:  None. FINDINGS: Brain: No evidence for acute brain infarct, intracranial hemorrhage or mass. Prominence of the sulci and ventricles are identified compatible with brain atrophy. Patchy areas of low attenuation throughout the subcortical  and periventricular white matter are identified compatible with chronic microvascular disease. Vascular: No hyperdense vessel or unexpected calcification. Skull: The osseous skull is intact. Sinuses/Orbits: There is mild mucosal thickening involving the left maxillary sinus. Other: None. IMPRESSION: 1. No acute intracranial abnormalities. 2. Chronic microvascular disease and brain atrophy. Electronically Signed   By: Kerby Moors M.D.   On: 01/28/2016 16:54   Dg Chest Port 1 View  Result Date: 01/28/2016 CLINICAL DATA:  Shortness of breath today. EXAM: PORTABLE CHEST 1 VIEW COMPARISON:  None. FINDINGS: Heart at the upper limits normal in size. There is atherosclerosis of the thoracic aorta. Mild interstitial prominence with a chronic appearance, however no prior exams for comparison. No pulmonary edema. Mild bibasilar atelectasis, right greater than left. No evidence pleural effusion. No pneumothorax. No osseous abnormality is seen. IMPRESSION: Borderline  cardiomegaly with atherosclerosis of the thoracic aorta. Mild bibasilar atelectasis. Electronically Signed   By: Jeb Levering M.D.   On: 01/28/2016 19:16    Procedures Procedures (including critical care time)  Medications Ordered in ED Medications  fluticasone (FLONASE) 50 MCG/ACT nasal spray 2 spray (not administered)  calcium carbonate (OS-CAL) tablet 600 mg (not administered)  dicyclomine (BENTYL) capsule 10 mg (not administered)  Magnesium 27 TABS 500 mg (not administered)  multivitamin tablet 1 tablet (not administered)  pravastatin (PRAVACHOL) tablet 40 mg (not administered)  vitamin C (ASCORBIC ACID) tablet 500 mg (not administered)   stroke: mapping our early stages of recovery book (not administered)  0.9 %  sodium chloride infusion (not administered)  senna-docusate (Senokot-S) tablet 1 tablet (not administered)  heparin injection 5,000 Units (not administered)  aspirin suppository 300 mg (not administered)    Or  aspirin tablet 325 mg (not administered)  HYDROmorphone (DILAUDID) injection 0.5 mg (0.5 mg Intravenous Given 01/28/16 1936)  hydrALAZINE (APRESOLINE) injection 5 mg (not administered)  hydrALAZINE (APRESOLINE) injection 10 mg (10 mg Intravenous Given 01/28/16 1743)  morphine 2 MG/ML injection 2 mg (1 mg Intravenous Given 01/28/16 1817)  ondansetron (ZOFRAN) injection 4 mg (4 mg Intravenous Given 01/28/16 1817)     Initial Impression / Assessment and Plan / ED Course  I have reviewed the triage vital signs and the nursing notes.  Pertinent labs & imaging results that were available during my care of the patient were reviewed by me and considered in my medical decision making (see chart for details).  Clinical Course   Patient presenting from optometry with a left retinal artery occulusion. CT in the ED was negative for any acute changes. Elevated blood pressures requiring hydralazine 10 mg, however patient to have permissive hypertension for the next 24-48  hours. Admit to hospitalist service. Discussed with neurology which recommends a CT angiogram and ECHO, however not a stroke. No need for neurology follow up.      Final Clinical Impressions(s) / ED Diagnoses   Final diagnoses:  Stroke (cerebrum) [facility])    New Prescriptions New Prescriptions   No medications on file     Ottie Neglia Cletis Media, MD 01/28/16 2021    Tanna Furry, MD 02/02/16 2359

## 2016-01-28 NOTE — ED Notes (Signed)
Pt requesting the rest of her morphine, 1mg . 1mg  of morphine given to patient IV at this time.

## 2016-01-28 NOTE — H&P (Signed)
History and Physical    Valerie Terrell FKC:[id number] DOB: July 24, 1932 DOA: 01/28/2016  PCP: No primary care provider on file.   Patient coming from: Home, by way of ophthalmologist's office   Chief Complaint: Headache, left eye retinal artery branch occlusion   HPI: Valerie Terrell is a 82 y.o. female with medical history significant for paroxysmal atrial fibrillation briefly on Xarelto prior to subarachnoid hemorrhage in 2016, hypertension, and hyperlipidemia who presents to the emergency department at the direction of her ophthalmologist for further evaluation and management of retinal artery branch occlusion involving the left eye. Patient reports that she had been in her usual state of health until the night of 01/21/2016 when she developed a severe left-sided retro-orbital headache. She took Tylenol with no appreciable relief, then took 2 Vicodin she had left over from a remote surgery and was able to get to sleep. The following morning, the headache had returned and she was evaluated by her PCP. She began to develop blurred vision in the left eye and a MRI brain was performed on 01/24/2016 with no acute findings. With continued worsening in her symptoms, she was reevaluated by ophthalmology today, diagnosed with left sided retinal artery branch occlusion, and treated in the clinic with laser therapy per the patient's report. She was then directed to the emergency department for further evaluation and management of this. Her blood pressure has been markedly elevated, into the low 200s over 100s, with her PCP and again at the ophthalmology clinic today.  ED Course: Upon arrival to the ED, patient is found to be afebrile, saturating well on room air, bradycardic in the mid 50s, and hypertensive to 230/90. Noncontrast head CT was negative for acute intracranial abnormality. Chemistry panel is notable for serum creatinine 1.32, up from an apparent baseline of roughly 1. CBC is unremarkable, INR is within  the normal limits, and troponin is also WNL. EKG features a junctional rhythm with T-wave flattening across diffusely. Patient was treated with 10 mg of IV hydralazine in the emergency department and symptomatic care was provided with morphine and Zofran. Neurology was consulted by the ED physician. Heart rate persists in the mid to high 50s and blood pressure has come down to the upper end of the normal range following the hydralazine and IVP. Patient will be admitted to the telemetry unit for ongoing evaluation and management of left sided retinal artery branch occlusion.  Review of Systems:  All other systems reviewed and apart from HPI, are negative.  Past Medical History:  Diagnosis Date  . Atrial fibrillation ([place])   . Hyperlipidemia   . Hypertension   . Small kidney    left    Past Surgical History:  Procedure Laterality Date  . CESAREAN SECTION    . TONSILLECTOMY       does not have a smoking history on file. She has never used smokeless tobacco. She reports that she drinks alcohol. She reports that she does not use drugs.  Allergies  Allergen Reactions  . Sulfa Antibiotics Other (See Comments)    Reaction unknown  . Nickel Rash    History reviewed. No pertinent family history.   Prior to Admission medications   Medication Sig Start Date End Date Taking? Authorizing Provider  amLODipine (NORVASC) 5 MG tablet Take 7.5 mg by mouth every morning.  01/27/16  Yes Historical Provider, MD  aspirin 81 MG chewable tablet Chew 81 mg by mouth daily.   Yes Historical Provider, MD  atenolol-chlorthalidone (TENORETIC) 100-25 MG tablet Take  1 tablet by mouth every morning. 01/27/16  Yes Historical Provider, MD  calcium carbonate (OS-CAL) 600 MG TABS tablet Take 600 mg by mouth 2 (two) times daily.   Yes Historical Provider, MD  dicyclomine (BENTYL) 10 MG capsule Take 10 mg by mouth 4 (four) times daily as needed. 01/27/16 02/10/16 Yes Historical Provider, MD  doxycycline (VIBRAMYCIN) 100  MG capsule Take 100 mg by mouth 2 (two) times daily. 01/22/16  Yes Historical Provider, MD  losartan (COZAAR) 100 MG tablet Take 100 mg by mouth daily. 01/27/16  Yes Historical Provider, MD  Magnesium Gluconate (MAGNESIUM 27) 500 (27 Mg) MG TABS Take 500 mg by mouth daily.   Yes Historical Provider, MD  Multiple Vitamin (MULTIVITAMIN) tablet Take 1 tablet by mouth daily.   Yes Historical Provider, MD  pravastatin (PRAVACHOL) 40 MG tablet Take 40 mg by mouth every evening. 07/16/15  Yes Historical Provider, MD  vitamin C (ASCORBIC ACID) 500 MG tablet Take 500 mg by mouth daily.   Yes Historical Provider, MD    Physical Exam: Vitals:   01/28/16 1800 01/28/16 1811 01/28/16 1815 01/28/16 1830  BP: 192/64  (!) 201/64 181/67  Pulse: (!) 57  61 (!) 59  Resp: 23  19 16   Temp:  98.1 F (36.7 C)    TempSrc:      SpO2: 97%  96% 99%  Weight:          Constitutional: NAD, calm, in apparent discomfort  Eyes: PERTLA, lids and conjunctivae normal ENMT: Mucous membranes are moist. Posterior pharynx clear of any exudate or lesions.   Neck: normal, supple, no masses, no thyromegaly Respiratory: clear to auscultation bilaterally, no wheezing, no crackles. Normal respiratory effort.   Cardiovascular: S1 & S2 heard, regular rate and rhythm, no significant murmur. 2+ pedal pulses. No carotid bruits. No significant JVD. Abdomen: No distension, no tenderness, no masses palpated. Bowel sounds normal.  Musculoskeletal: no clubbing / cyanosis. No joint deformity upper and lower extremities. Normal muscle tone.  Skin: no significant rashes, lesions, ulcers. Warm, dry, well-perfused. Neurologic: CN 2-12 grossly intact. Sensation intact, DTR normal. Strength 5/5 in all 4 limbs. Pupils widely dilated bilaterally s/p treatment with ophthalmology Psychiatric: Normal judgment and insight. Alert and oriented x 3. Normal mood and affect.     Labs on Admission: I have personally reviewed following labs and imaging  studies  CBC:  Recent Labs Lab 01/28/16 1629 01/28/16 1653  WBC 7.4  --   NEUTROABS 4.5  --   HGB 14.0 14.3  HCT 43.0 42.0  MCV 94.7  --   PLT 189  --    Basic Metabolic Panel:  Recent Labs Lab 01/28/16 1629 01/28/16 1653  NA 142 141  K 3.7 3.7  CL 103 102  CO2 29  --   GLUCOSE 99 98  BUN 33* 35*  CREATININE 1.32* 1.40*  CALCIUM 10.1  --    GFR: CrCl cannot be calculated (Unknown ideal weight.). Liver Function Tests:  Recent Labs Lab 01/28/16 1629  AST 23  ALT 15  ALKPHOS 43  BILITOT 0.7  PROT 7.3  ALBUMIN 4.1   No results for input(s): LIPASE, AMYLASE in the last 168 hours. No results for input(s): AMMONIA in the last 168 hours. Coagulation Profile:  Recent Labs Lab 01/28/16 1629  INR 0.91   Cardiac Enzymes: No results for input(s): CKTOTAL, CKMB, CKMBINDEX, TROPONINI in the last 168 hours. BNP (last 3 results) No results for input(s): PROBNP in the last 8760 hours. HbA1C:  No results for input(s): HGBA1C in the last 72 hours. CBG: No results for input(s): GLUCAP in the last 168 hours. Lipid Profile: No results for input(s): CHOL, HDL, LDLCALC, TRIG, CHOLHDL, LDLDIRECT in the last 72 hours. Thyroid Function Tests: No results for input(s): TSH, T4TOTAL, FREET4, T3FREE, THYROIDAB in the last 72 hours. Anemia Panel: No results for input(s): VITAMINB12, FOLATE, FERRITIN, TIBC, IRON, RETICCTPCT in the last 72 hours. Urine analysis: No results found for: COLORURINE, APPEARANCEUR, LABSPEC, PHURINE, GLUCOSEU, HGBUR, BILIRUBINUR, KETONESUR, PROTEINUR, UROBILINOGEN, NITRITE, LEUKOCYTESUR Sepsis Labs: @LABRCNTIP (procalcitonin:4,lacticidven:4) )No results found for this or any previous visit (from the past 240 hour(s)).   Radiological Exams on Admission: Ct Head Wo Contrast  Result Date: 01/28/2016 CLINICAL DATA:  Hypertension and ocular occlusion. Headache and dizziness and nausea. EXAM: CT HEAD WITHOUT CONTRAST TECHNIQUE: Contiguous axial images were  obtained from the base of the skull through the vertex without intravenous contrast. COMPARISON:  None. FINDINGS: Brain: No evidence for acute brain infarct, intracranial hemorrhage or mass. Prominence of the sulci and ventricles are identified compatible with brain atrophy. Patchy areas of low attenuation throughout the subcortical and periventricular white matter are identified compatible with chronic microvascular disease. Vascular: No hyperdense vessel or unexpected calcification. Skull: The osseous skull is intact. Sinuses/Orbits: There is mild mucosal thickening involving the left maxillary sinus. Other: None. IMPRESSION: 1. No acute intracranial abnormalities. 2. Chronic microvascular disease and brain atrophy. Electronically Signed   By: Kerby Moors M.D.   On: 01/28/2016 16:54    EKG: Independently reviewed. Junctional rhythm with diffuse T-wave flattening  Assessment/Plan  1. Left branch retinal artery occlusion  - Diagnosed just PTA and per pt report, laser treatment was performed in the ophthalmology clinic just prior to arrival here  - Secondary to distal emboli (pt has PAF), local atheroma, small vessel disease; GCA less likely, will check sed rate - Head CT negative in ED; aside from vision, no focal neurologic deficits identified  - Neurology is consulting and much appreciated, will follow-up recs  - tPA not given d/t hx of SAH, unclear time since onset but likely days  - Monitor on telemetry, obtain MRI/MRA, carotid dopplers, TTE  - Keep NPO until bedside swallow eval passed  - Frequent neuro checks  - Prophylactic ASA, continue statin  - Check fasting lipid panel and A1c  - PT, OT, SLP evals requested   2. Hypertension  - Markedly elevated on admission, came down to normal range after a 10 mg IVP hydralazine in ED  - Managed with Norvasc, atenolol, and chlorthalidone at home - Will follow-up with neuro regarding BP recs in this setting; plan to hold the home agents for now  and treat with prn agents for pressures >200/100   3. Paroxysmal atrial fibrillation  - In a junctional rhythm at time of admission  - CHADS-VASc at least 5 (age x2, gender, HTN, aortic plaque), 7 if current BRAO considered as CVA    - Followed by [facility] cardiology - Was on Xarelto very briefly prior to suffering a SAH in June 2016  - Currently managed with ASA 81 and atenolol; ASA increased at time of admission for CVA ppx    4. Headache  - Likely secondary to HTN  - With the BRAO, giant cell arteritis is considered and sed rate pending    5. AKI superimposed on CKD stage II  - SCr 1.32 on admission, up from apparent baseline of ~1  - Likely a prerenal azotemia in setting of recent poor  oral intake  - Anticipate resolution with a gentle IVF hydration - Repeat chem panel in am   6. Hx of SAH on Xarelto  - Occurred in June of 2016 - Followed by [facility] cardiology for PAF and kept on ASA 81 only for CVA ppx     DVT prophylaxis: sq heparin Code Status: Full  Family Communication: Daughter updated at bedside Disposition Plan: Observe on telemetry Consults called: Neurology  Admission status: Observation     Vianne Bulls, MD [facility] Pager [phone]  If 7PM-7AM, please contact night-coverage [email or web address] Password [facility]  01/28/2016, 7:03 PM

## 2016-01-28 NOTE — ED Triage Notes (Signed)
Pt states she has had HA, nausea, dizziness for the past week. Pt states she has had blurred vision in left eye for the past week. Pt went to eye MD today diagnosed with occlusion and sent to ED today for further evaluation. BP also 419 systolic at MD office.

## 2016-01-28 NOTE — ED Notes (Signed)
Pt reports 'I cant breathe through my nose, my left nostril feels closed." RR unlabored.O2 sats 98%. Pt requested oxygen. 2L Bro[place]ax placed for comfort. Dr. Emmaline Life informed and at bedside.

## 2016-01-28 NOTE — ED Notes (Signed)
Pt wheeled back to room by this RN via wheelchair accompanied by pts daughter. Pt connected to 5-lead cardiac monitor, pulse ox and BP cuff. Pt given a warm blanket.

## 2016-01-28 NOTE — Consult Note (Signed)
Admission H&P    Chief Complaint: Decreased peripheral vision of left eye and headache.  HPI: Valerie Terrell is an 82 y.o. female with a history of hypertension and hyperlipidemia as well as a history of paroxysmal atrial fibrillation presenting with headache and blurred vision involving left eye for one week. She was evaluated at [facility] last week and had an MRI of her brain with and without contrast which showed no acute abnormality. She was also seen by optometrist who referred her to the ED at [facility] for further evaluation of headache and visual changes. It was noted on examination and she had a retinal artery branch occlusion. CT scan in the ED here was unremarkable. Sedimentation rate was 22. She's been taking aspirin daily. She was given a trial of Xaralto at one point and Jennye Moccasin was discontinued following a closed head injury with subarachnoid hemorrhage.  LSN: 01/21/2016 tPA Given: No: Beyond time window for treatment consideration mRankin:  Past Medical History:  Diagnosis Date  . Atrial fibrillation ([place])   . Hyperlipidemia   . Hypertension   . Small kidney    left    Past Surgical History:  Procedure Laterality Date  . CESAREAN SECTION    . TONSILLECTOMY      Family History  Problem Relation Age of Onset  . Sudden Cardiac Death Neg Hx    Social History:  does not have a smoking history on file. She has never used smokeless tobacco. She reports that she drinks alcohol. She reports that she does not use drugs.  Allergies:  Allergies  Allergen Reactions  . Sulfa Antibiotics Other (See Comments)    Reaction unknown  . Nickel Rash    Medications Prior to Admission  Medication Sig Dispense Refill  . amLODipine (NORVASC) 5 MG tablet Take 7.5 mg by mouth every morning.     [name] aspirin 81 MG chewable tablet Chew 81 mg by mouth daily.    [name] atenolol-chlorthalidone (TENORETIC) 100-25 MG tablet Take 1 tablet by mouth every morning.    . calcium carbonate (OS-CAL)  600 MG TABS tablet Take 600 mg by mouth 2 (two) times daily.    [name] dicyclomine (BENTYL) 10 MG capsule Take 10 mg by mouth 4 (four) times daily as needed.    . doxycycline (VIBRAMYCIN) 100 MG capsule Take 100 mg by mouth 2 (two) times daily.    [name] losartan (COZAAR) 100 MG tablet Take 100 mg by mouth daily.    . Magnesium Gluconate (MAGNESIUM 27) 500 (27 Mg) MG TABS Take 500 mg by mouth daily.    . Multiple Vitamin (MULTIVITAMIN) tablet Take 1 tablet by mouth daily.    . pravastatin (PRAVACHOL) 40 MG tablet Take 40 mg by mouth every evening.    . vitamin C (ASCORBIC ACID) 500 MG tablet Take 500 mg by mouth daily.      ROS: History obtained from the patient  General ROS: negative for - chills, fatigue, fever, night sweats, weight gain or weight loss Psychological ROS: negative for - behavioral disorder, hallucinations, memory difficulties, mood swings or suicidal ideation Ophthalmic ROS: As noted in present illness ENT ROS: negative for - epistaxis, nasal discharge, oral lesions, sore throat, tinnitus or vertigo Allergy and Immunology ROS: negative for - hives or itchy/watery eyes Hematological and Lymphatic ROS: negative for - bleeding problems, bruising or swollen lymph nodes Endocrine ROS: negative for - galactorrhea, hair pattern changes, polydipsia/polyuria or temperature intolerance Respiratory ROS: negative for - cough, hemoptysis, shortness of breath or wheezing  Cardiovascular ROS: negative for - chest pain, dyspnea on exertion, edema or irregular heartbeat Gastrointestinal ROS: negative for - abdominal pain, diarrhea, hematemesis, nausea/vomiting or stool incontinence Genito-Urinary ROS: negative for - dysuria, hematuria, incontinence or urinary frequency/urgency Musculoskeletal ROS: negative for - joint swelling or muscular weakness Neurological ROS: as noted in HPI Dermatological ROS: negative for rash and skin lesion changes  Physical Examination: Blood pressure (!) 173/57, pulse  (!) 57, temperature 98.4 F (36.9 C), temperature source Oral, resp. rate 18, height 5' (1.524 m), weight 71.1 kg (156 lb 11.2 oz), SpO2 96 %.  HEENT-  Normocephalic, no lesions, without obvious abnormality.  Normal external eye and conjunctiva.  Normal TM's bilaterally.  Normal auditory canals and external ears. Normal external nose, mucus membranes and septum.  Normal pharynx. Neck supple with no masses, nodes, nodules or enlargement. Cardiovascular - regular rate and rhythm, S1, S2 normal, no murmur, click, rub or gallop Lungs - chest clear, no wheezing, rales, normal symmetric air entry Abdomen - soft, non-tender; bowel sounds normal; no masses,  no organomegaly Extremities - no joint deformities, effusion, or inflammation and no edema  Neurologic Examination: Mental Status: Alert, oriented, no acute distress.  Speech fluent without evidence of aphasia. Able to follow commands without difficulty. Cranial Nerves: II-Visual fields were normal. III/IV/VI-Pupils were equal and reacted normally to light. Extraocular movements were full and conjugate.    V/VII-no facial numbness and no facial weakness. VIII-normal. X-normal speech. XI: trapezius strength/neck flexion strength normal bilaterally XII-midline tongue extension with normal strength. Motor: 5/5 bilaterally with normal tone and bulk Sensory: Normal throughout. Deep Tendon Reflexes: 1+ and symmetric. Plantars: Mute bilaterally Cerebellar: Normal finger-to-nose testing. Carotid auscultation: Normal  Results for orders placed or performed during the hospital encounter of 01/28/16 (from the past 48 hour(s))  Protime-INR     Status: None   Collection Time: 01/28/16  4:29 PM  Result Value Ref Range   Prothrombin Time 12.2 11.4 - 15.2 seconds   INR 0.91   APTT     Status: None   Collection Time: 01/28/16  4:29 PM  Result Value Ref Range   aPTT 36 24 - 36 seconds  CBC     Status: None   Collection Time: 01/28/16  4:29 PM   Result Value Ref Range   WBC 7.4 4.0 - 10.5 K/uL   RBC 4.54 3.87 - 5.11 MIL/uL   Hemoglobin 14.0 12.0 - 15.0 g/dL   HCT 43.0 36.0 - 46.0 %   MCV 94.7 78.0 - 100.0 fL   MCH 30.8 26.0 - 34.0 pg   MCHC 32.6 30.0 - 36.0 g/dL   RDW 13.4 11.5 - 15.5 %   Platelets 189 150 - 400 K/uL  Differential     Status: None   Collection Time: 01/28/16  4:29 PM  Result Value Ref Range   Neutrophils Relative % 60 %   Neutro Abs 4.5 1.7 - 7.7 K/uL   Lymphocytes Relative 24 %   Lymphs Abs 1.7 0.7 - 4.0 K/uL   Monocytes Relative 13 %   Monocytes Absolute 0.9 0.1 - 1.0 K/uL   Eosinophils Relative 3 %   Eosinophils Absolute 0.2 0.0 - 0.7 K/uL   Basophils Relative 0 %   Basophils Absolute 0.0 0.0 - 0.1 K/uL  Comprehensive metabolic panel     Status: Abnormal   Collection Time: 01/28/16  4:29 PM  Result Value Ref Range   Sodium 142 135 - 145 mmol/L   Potassium 3.7 3.5 - 5.1  mmol/L   Chloride 103 101 - 111 mmol/L   CO2 29 22 - 32 mmol/L   Glucose, Bld 99 65 - 99 mg/dL   BUN 33 (H) 6 - 20 mg/dL   Creatinine, Ser 1.32 (H) 0.44 - 1.00 mg/dL   Calcium 10.1 8.9 - 10.3 mg/dL   Total Protein 7.3 6.5 - 8.1 g/dL   Albumin 4.1 3.5 - 5.0 g/dL   AST 23 15 - 41 U/L   ALT 15 14 - 54 U/L   Alkaline Phosphatase 43 38 - 126 U/L   Total Bilirubin 0.7 0.3 - 1.2 mg/dL   GFR calc non Af Amer 37 (L) >60 mL/min   GFR calc Af Amer 43 (L) >60 mL/min    Comment: (NOTE) The eGFR has been calculated using the CKD EPI equation. This calculation has not been validated in all clinical situations. eGFR's persistently <60 mL/min signify possible Chronic Kidney Disease.    Anion gap 10 5 - 15  I-stat troponin, ED     Status: None   Collection Time: 01/28/16  4:51 PM  Result Value Ref Range   Troponin i, poc 0.03 0.00 - 0.08 ng/mL   Comment 3            Comment: Due to the release kinetics of cTnI, a negative result within the first hours of the onset of symptoms does not rule out myocardial infarction with  certainty. If myocardial infarction is still suspected, repeat the test at appropriate intervals.   I-Stat Chem 8, ED     Status: Abnormal   Collection Time: 01/28/16  4:53 PM  Result Value Ref Range   Sodium 141 135 - 145 mmol/L   Potassium 3.7 3.5 - 5.1 mmol/L   Chloride 102 101 - 111 mmol/L   BUN 35 (H) 6 - 20 mg/dL   Creatinine, Ser 1.40 (H) 0.44 - 1.00 mg/dL   Glucose, Bld 98 65 - 99 mg/dL   Calcium, Ion 1.20 1.15 - 1.40 mmol/L   TCO2 29 0 - 100 mmol/L   Hemoglobin 14.3 12.0 - 15.0 g/dL   HCT 42.0 36.0 - 46.0 %  Sedimentation rate     Status: None   Collection Time: 01/28/16  7:59 PM  Result Value Ref Range   Sed Rate 22 0 - 22 mm/hr   Ct Head Wo Contrast  Result Date: 01/28/2016 CLINICAL DATA:  Hypertension and ocular occlusion. Headache and dizziness and nausea. EXAM: CT HEAD WITHOUT CONTRAST TECHNIQUE: Contiguous axial images were obtained from the base of the skull through the vertex without intravenous contrast. COMPARISON:  None. FINDINGS: Brain: No evidence for acute brain infarct, intracranial hemorrhage or mass. Prominence of the sulci and ventricles are identified compatible with brain atrophy. Patchy areas of low attenuation throughout the subcortical and periventricular white matter are identified compatible with chronic microvascular disease. Vascular: No hyperdense vessel or unexpected calcification. Skull: The osseous skull is intact. Sinuses/Orbits: There is mild mucosal thickening involving the left maxillary sinus. Other: None. IMPRESSION: 1. No acute intracranial abnormalities. 2. Chronic microvascular disease and brain atrophy. Electronically Signed   By: Kerby Moors M.D.   On: 01/28/2016 16:54   Dg Chest Port 1 View  Result Date: 01/28/2016 CLINICAL DATA:  Shortness of breath today. EXAM: PORTABLE CHEST 1 VIEW COMPARISON:  None. FINDINGS: Heart at the upper limits normal in size. There is atherosclerosis of the thoracic aorta. Mild interstitial prominence with  a chronic appearance, however no prior exams for comparison. No pulmonary  edema. Mild bibasilar atelectasis, right greater than left. No evidence pleural effusion. No pneumothorax. No osseous abnormality is seen. IMPRESSION: Borderline cardiomegaly with atherosclerosis of the thoracic aorta. Mild bibasilar atelectasis. Electronically Signed   By: Jeb Levering M.D.   On: 01/28/2016 19:16    Assessment: 82 y.o. female history of hypertension and hyperlipidemia as well as atrial fibrillation, presenting with left retinal artery branch occlusion with blurred vision peripherally. Etiology is unclear. Embolus is suspected, possibly carotid or cardiac origin. Temporal arteritis is unlikely with sedimentation rate of 22.  Stroke Risk Factors - atrial fibrillation, hyperlipidemia and hypertension  Plan: 1. HgbA1c, fasting lipid panel 2. MRI of the brain performed on 01/24/2016 with and without contrast at [facility] 3. PT consult, OT consult, Speech consult 4. Echocardiogram 5. CT angiogram of head and neck with contrast 6. Prophylactic therapy-Antiplatelet med: Aspirin  7. Risk factor modification 8. Telemetry monitoring  C.R. Nicole Kindred, MD [facility] [phone]  01/28/2016, 9:57 PM

## 2016-01-29 ENCOUNTER — Inpatient Hospital Stay (HOSPITAL_COMMUNITY): Payer: Medicare Other

## 2016-01-29 ENCOUNTER — Observation Stay (HOSPITAL_COMMUNITY): Payer: Medicare Other

## 2016-01-29 ENCOUNTER — Encounter (HOSPITAL_COMMUNITY): Payer: Self-pay | Admitting: Radiology

## 2016-01-29 ENCOUNTER — Ambulatory Visit (HOSPITAL_BASED_OUTPATIENT_CLINIC_OR_DEPARTMENT_OTHER): Payer: Medicare Other

## 2016-01-29 ENCOUNTER — Encounter (HOSPITAL_COMMUNITY): Payer: Medicare Other

## 2016-01-29 DIAGNOSIS — Z7982 Long term (current) use of aspirin: Secondary | ICD-10-CM | POA: Diagnosis not present

## 2016-01-29 DIAGNOSIS — I129 Hypertensive chronic kidney disease with stage 1 through stage 4 chronic kidney disease, or unspecified chronic kidney disease: Secondary | ICD-10-CM | POA: Diagnosis present

## 2016-01-29 DIAGNOSIS — I10 Essential (primary) hypertension: Secondary | ICD-10-CM | POA: Diagnosis not present

## 2016-01-29 DIAGNOSIS — I6789 Other cerebrovascular disease: Secondary | ICD-10-CM | POA: Diagnosis not present

## 2016-01-29 DIAGNOSIS — Z91048 Other nonmedicinal substance allergy status: Secondary | ICD-10-CM | POA: Diagnosis not present

## 2016-01-29 DIAGNOSIS — R079 Chest pain, unspecified: Secondary | ICD-10-CM | POA: Diagnosis not present

## 2016-01-29 DIAGNOSIS — I700 Atherosclerosis of aorta: Secondary | ICD-10-CM | POA: Diagnosis present

## 2016-01-29 DIAGNOSIS — N270 Small kidney, unilateral: Secondary | ICD-10-CM | POA: Diagnosis present

## 2016-01-29 DIAGNOSIS — E669 Obesity, unspecified: Secondary | ICD-10-CM | POA: Diagnosis present

## 2016-01-29 DIAGNOSIS — N182 Chronic kidney disease, stage 2 (mild): Secondary | ICD-10-CM | POA: Diagnosis present

## 2016-01-29 DIAGNOSIS — N179 Acute kidney failure, unspecified: Secondary | ICD-10-CM | POA: Diagnosis present

## 2016-01-29 DIAGNOSIS — I480 Paroxysmal atrial fibrillation: Secondary | ICD-10-CM | POA: Diagnosis present

## 2016-01-29 DIAGNOSIS — Z881 Allergy status to other antibiotic agents status: Secondary | ICD-10-CM | POA: Diagnosis not present

## 2016-01-29 DIAGNOSIS — R0789 Other chest pain: Secondary | ICD-10-CM | POA: Diagnosis not present

## 2016-01-29 DIAGNOSIS — Z8249 Family history of ischemic heart disease and other diseases of the circulatory system: Secondary | ICD-10-CM | POA: Diagnosis not present

## 2016-01-29 DIAGNOSIS — E785 Hyperlipidemia, unspecified: Secondary | ICD-10-CM | POA: Diagnosis present

## 2016-01-29 DIAGNOSIS — Z6830 Body mass index (BMI) 30.0-30.9, adult: Secondary | ICD-10-CM | POA: Diagnosis not present

## 2016-01-29 DIAGNOSIS — Z79899 Other long term (current) drug therapy: Secondary | ICD-10-CM | POA: Diagnosis not present

## 2016-01-29 DIAGNOSIS — H34232 Retinal artery branch occlusion, left eye: Secondary | ICD-10-CM | POA: Diagnosis present

## 2016-01-29 DIAGNOSIS — R001 Bradycardia, unspecified: Secondary | ICD-10-CM | POA: Diagnosis present

## 2016-01-29 DIAGNOSIS — I639 Cerebral infarction, unspecified: Secondary | ICD-10-CM | POA: Diagnosis present

## 2016-01-29 LAB — ECHOCARDIOGRAM COMPLETE
E decel time: 173 msec
EERAT: 13.22
FS: 30 % (ref 28–44)
HEIGHTINCHES: 60 in
IVS/LV PW RATIO, ED: 0.84
LA ID, A-P, ES: 45 mm
LA diam index: 2.68 cm/m2
LA vol A4C: 61.1 ml
LA vol index: 37.9 mL/m2
LA vol: 63.6 mL
LDCA: 2.54 cm2
LEFT ATRIUM END SYS DIAM: 45 mm
LV E/e' medial: 13.22
LV E/e'average: 13.22
LVELAT: 6.64 cm/s
LVOT diameter: 18 mm
MV Dec: 173
MV pk A vel: 91.7 m/s
MVPG: 3 mmHg
MVPKEVEL: 87.8 m/s
PW: 11.7 mm — AB (ref 0.6–1.1)
RV LATERAL S' VELOCITY: 15.2 cm/s
RV TAPSE: 25.6 mm
TDI e' lateral: 6.64
TDI e' medial: 6.85
WEIGHTICAEL: 2507.2 [oz_av]

## 2016-01-29 LAB — LIPID PANEL
CHOL/HDL RATIO: 4.2 ratio
Cholesterol: 174 mg/dL (ref 0–200)
HDL: 41 mg/dL (ref >40)
LDL CALC: 97 mg/dL (ref 0–99)
Triglycerides: 180 mg/dL — ABNORMAL HIGH (ref <150)
VLDL: 36 mg/dL (ref 0–40)

## 2016-01-29 LAB — MRSA PCR SCREENING: MRSA by PCR: NEGATIVE

## 2016-01-29 MED ORDER — SODIUM CHLORIDE 0.9 % IV SOLN: INTRAVENOUS | Status: DC

## 2016-01-29 MED ORDER — IOPAMIDOL (ISOVUE-370) INJECTION 76%
INTRAVENOUS | Status: AC
  Filled 2016-01-29: qty 50

## 2016-01-29 MED ORDER — ATENOLOL 100 MG PO TABS
100.0000 mg | ORAL_TABLET | Freq: Every day | ORAL | Status: DC
  Administered 2016-01-30 – 2016-02-01 (×3): 100 mg via ORAL
  Filled 2016-01-29 (×3): qty 1

## 2016-01-29 MED ORDER — CHLORTHALIDONE 25 MG PO TABS
25.0000 mg | ORAL_TABLET | Freq: Every day | ORAL | Status: DC
  Administered 2016-01-30 – 2016-02-01 (×3): 25 mg via ORAL
  Filled 2016-01-29 (×3): qty 1

## 2016-01-29 MED ORDER — AMLODIPINE BESYLATE 5 MG PO TABS
7.5000 mg | ORAL_TABLET | Freq: Every morning | ORAL | Status: DC
  Administered 2016-01-30 – 2016-01-31 (×2): 7.5 mg via ORAL
  Filled 2016-01-29 (×2): qty 1

## 2016-01-29 MED ORDER — ONDANSETRON HCL 4 MG/2ML IJ SOLN
4.0000 mg | Freq: Four times a day (QID) | INTRAMUSCULAR | Status: DC | PRN
  Administered 2016-01-29 (×3): 4 mg via INTRAVENOUS
  Filled 2016-01-29 (×4): qty 2

## 2016-01-29 MED ORDER — NITROGLYCERIN 0.4 MG SL SUBL
SUBLINGUAL_TABLET | SUBLINGUAL | Status: AC
  Administered 2016-01-29: 0.4 mg
  Filled 2016-01-29: qty 1

## 2016-01-29 MED ORDER — ORAL CARE MOUTH RINSE
15.0000 mL | Freq: Two times a day (BID) | OROMUCOSAL | Status: DC
  Administered 2016-01-30 – 2016-01-31 (×3): 15 mL via OROMUCOSAL

## 2016-01-29 MED ORDER — LOSARTAN POTASSIUM 50 MG PO TABS
100.0000 mg | ORAL_TABLET | Freq: Every day | ORAL | Status: DC
  Administered 2016-01-29 – 2016-02-01 (×4): 100 mg via ORAL
  Filled 2016-01-29 (×4): qty 2

## 2016-01-29 MED ORDER — TECHNETIUM TC 99M DIETHYLENETRIAME-PENTAACETIC ACID: 32.4000 | Freq: Once | INTRAVENOUS | Status: DC | PRN

## 2016-01-29 MED ORDER — MORPHINE SULFATE (PF) 2 MG/ML IV SOLN
INTRAVENOUS | Status: AC
  Filled 2016-01-29: qty 1

## 2016-01-29 MED ORDER — PROMETHAZINE HCL 25 MG/ML IJ SOLN
6.2500 mg | Freq: Four times a day (QID) | INTRAMUSCULAR | Status: DC | PRN
  Administered 2016-01-29: 12.5 mg via INTRAVENOUS
  Filled 2016-01-29 (×2): qty 1

## 2016-01-29 MED ORDER — IOPAMIDOL (ISOVUE-370) INJECTION 76%
INTRAVENOUS | Status: AC
  Administered 2016-01-29: 50 mL
  Filled 2016-01-29: qty 50

## 2016-01-29 MED ORDER — NITROGLYCERIN 0.4 MG SL SUBL
0.4000 mg | SUBLINGUAL_TABLET | SUBLINGUAL | Status: DC | PRN
  Administered 2016-01-29: 0.4 mg via SUBLINGUAL

## 2016-01-29 MED ORDER — TECHNETIUM TO 99M ALBUMIN AGGREGATED
4.4000 | Freq: Once | INTRAVENOUS | Status: AC | PRN
  Administered 2016-01-29: 4 via INTRAVENOUS

## 2016-01-29 MED ORDER — HYDRALAZINE HCL 20 MG/ML IJ SOLN
5.0000 mg | INTRAMUSCULAR | Status: DC | PRN
  Administered 2016-01-29 – 2016-02-01 (×4): 5 mg via INTRAVENOUS
  Filled 2016-01-29 (×4): qty 1

## 2016-01-29 MED ORDER — MORPHINE SULFATE (PF) 2 MG/ML IV SOLN
2.0000 mg | Freq: Once | INTRAVENOUS | Status: AC
  Administered 2016-01-29: 2 mg via INTRAVENOUS

## 2016-01-29 MED ORDER — ACETAMINOPHEN 325 MG PO TABS
650.0000 mg | ORAL_TABLET | Freq: Four times a day (QID) | ORAL | Status: DC | PRN
  Administered 2016-01-29: 650 mg via ORAL
  Filled 2016-01-29 (×2): qty 2

## 2016-01-29 MED ORDER — ATENOLOL-CHLORTHALIDONE 100-25 MG PO TABS: 1.0000 | ORAL_TABLET | Freq: Every morning | ORAL | Status: DC

## 2016-01-29 NOTE — Progress Notes (Signed)
PROGRESS NOTE    Valerie Terrell  KNL:[id number] DOB: [birth year] DOA: 01/28/2016 PCP: Amador Cunas, FNP      Brief Narrative:  Valerie Terrell is a 82 y.o. female with medical history significant for paroxysmal atrial fibrillation briefly on Xarelto prior to subarachnoid hemorrhage in 2016, hypertension, and hyperlipidemia who presents to the emergency department at the direction of her ophthalmologist for further evaluation and management of retinal artery branch occlusion involving the left eye. Patient reports that she had been in her usual state of health until the night of 01/21/2016 when she developed a severe left-sided retro-orbital headache. She began to develop blurred vision in the left eye and a MRI brain was performed on 01/24/2016 with no acute findings. With continued worsening in her symptoms, she was reevaluated by ophthalmology 9/12, diagnosed with left sided retinal artery branch occlusion, and treated in the clinic with laser therapy per the patient's report. She was then directed to the emergency department for further evaluation and management. Neurology was consulted.    Assessment & Plan:   Principal Problem:   Retinal artery branch occlusion Active Problems:   AKI (acute kidney injury) ([place])   Atrial fibrillation ([facility])   Hypertension   Headache   Stroke [facility])   Retinal artery branch occlusion of left eye   Personal history of subarachnoid hemorrhage   CKD (chronic kidney disease), stage II  Left branch retinal artery occlusion  - Diagnosed just PTA and per pt report, laser treatment was performed in the ophthalmology clinic just prior to arrival here  - Secondary to distal emboli (pt has PAF), local atheroma, small vessel disease; GCA less likely (sed rate 22) - Head CT: negative - MRI of the brain performed on 01/24/2016 with and without contrast at [facility] - Neurology following, appreciate recommendations   - Neuro checks  - Asa daily  - Continue  statin  - Check fasting lipid panel and A1c  - PT, OT, SLP evals  - Echo: no cardiac source of emboli  - CTA head and neck with contrast pending  - MR brain pending   Hypertension  - Managed with Norvasc, atenolol, cozaar and chlorthalidone at home  Paroxysmal atrial fibrillation  - In a junctional rhythm at time of admission  - CHADS-VASc at least 5 (age x2, gender, HTN, aortic plaque), 7 if current BRAO considered as CVA    - Followed by [facility] cardiology - Was on Xarelto very briefly prior to suffering a SAH in June 2016  - Continue Asa   Headache  - Likely secondary to HTN  - Less likely giant cell arteritis with sed rate 22   AKI superimposed on CKD stage II  - Baseline Cr of ~1  - Likely a prerenal azotemia in setting of recent poor oral intake  - IVF  - Check Ua, renal US   Hx of SAH on Xarelto  - Occurred in June of 2016  - Followed by [facility] cardiology for PAF and kept on ASA 81 only for CVA ppx     DVT prophylaxis: sq heparin Code Status: Full  Family Communication: spoke with daughter in room this morning Disposition Plan: Further workup to follow. Hopeful discharge 9/14    Consultants:  Neurology   Procedures:   Echo Study Conclusions - Left ventricle: The cavity size was normal. Wall thickness was   normal. Systolic function was normal. The estimated ejection   fraction was in the range of 55% to 60%. Wall motion was normal;  there were no regional wall motion abnormalities. Doppler   parameters are consistent with abnormal left ventricular   relaxation (grade 1 diastolic dysfunction). - Mitral valve: Calcified annulus.  Impressions: - No cardiac source of emboli was indentified.  Antimicrobials:   None     Subjective: Patient complains of "darkness" of her left visual field consistent with her previous diagnosis of left retinal artery branch occlusion. She does not have any new neurological symptoms today to report. She denies any  worsening headache, chest pain, shortness of breath, vomiting, diarrhea, abdominal pain, dysuria. She did have significant nausea this morning, which was improved with Phenergan. She complains of back pain due to laying in bed all day.  Objective: Vitals:   01/29/16 0600 01/29/16 0929 01/29/16 1330 01/29/16 1545  BP: (!) 173/63 (!) 167/63 (!) 181/62   Pulse: 70 64 70   Resp: 18 18 18    Temp: 98.2 F (36.8 C) 98.4 F (36.9 C) 99.1 F (37.3 C)   TempSrc: Oral Oral Oral   SpO2: 96% 96% 98% 96%  Weight:      Height:        Intake/Output Summary (Last 24 hours) at 01/29/16 1715 Last data filed at 01/29/16 1300  Gross per 24 hour  Intake              385 ml  Output                0 ml  Net              385 ml   Filed Weights   01/28/16 1549 01/28/16 2028  Weight: 68 kg (150 lb) 71.1 kg (156 lb 11.2 oz)    Examination:  General exam: Appears calm and comfortable  Respiratory system: Clear to auscultation. Respiratory effort normal. Cardiovascular system: S1 & S2 heard, RRR. No JVD, murmurs, rubs, gallops or clicks. No pedal edema. Gastrointestinal system: Abdomen is nondistended, soft and nontender. No organomegaly or masses felt. Normal bowel sounds heard. Central nervous system: Alert and oriented. Left visual field deficit. No other gross CN deficit. Left facial droop at rest, but facial movement is symmetric bilaterally  Extremities: Symmetric 5 x 5 power. Skin: No rashes, lesions or ulcers Psychiatry: Judgement and insight appear normal. Mood & affect appropriate.   Data Reviewed: I have personally reviewed following labs and imaging studies  CBC:  Recent Labs Lab 01/28/16 1629 01/28/16 1653  WBC 7.4  --   NEUTROABS 4.5  --   HGB 14.0 14.3  HCT 43.0 42.0  MCV 94.7  --   PLT 189  --    Basic Metabolic Panel:  Recent Labs Lab 01/28/16 1629 01/28/16 1653  NA 142 141  K 3.7 3.7  CL 103 102  CO2 29  --   GLUCOSE 99 98  BUN 33* 35*  CREATININE 1.32* 1.40*    CALCIUM 10.1  --    GFR: Estimated Creatinine Clearance: 28.2 mL/min (by C-G formula based on SCr of 1.4 mg/dL (H)). Liver Function Tests:  Recent Labs Lab 01/28/16 1629  AST 23  ALT 15  ALKPHOS 43  BILITOT 0.7  PROT 7.3  ALBUMIN 4.1   No results for input(s): LIPASE, AMYLASE in the last 168 hours. No results for input(s): AMMONIA in the last 168 hours. Coagulation Profile:  Recent Labs Lab 01/28/16 1629  INR 0.91   Cardiac Enzymes: No results for input(s): CKTOTAL, CKMB, CKMBINDEX, TROPONINI in the last 168 hours. BNP (last 3 results) No  results for input(s): PROBNP in the last 8760 hours. HbA1C: No results for input(s): HGBA1C in the last 72 hours. CBG: No results for input(s): GLUCAP in the last 168 hours. Lipid Profile:  Recent Labs  01/29/16 0443  CHOL 174  HDL 41  LDLCALC 97  TRIG 180*  CHOLHDL 4.2   Thyroid Function Tests: No results for input(s): TSH, T4TOTAL, FREET4, T3FREE, THYROIDAB in the last 72 hours. Anemia Panel: No results for input(s): VITAMINB12, FOLATE, FERRITIN, TIBC, IRON, RETICCTPCT in the last 72 hours. Sepsis Labs: No results for input(s): PROCALCITON, LATICACIDVEN in the last 168 hours.  No results found for this or any previous visit (from the past 240 hour(s)).       Radiology Studies: Ct Head Wo Contrast  Result Date: 01/28/2016 CLINICAL DATA:  Hypertension and ocular occlusion. Headache and dizziness and nausea. EXAM: CT HEAD WITHOUT CONTRAST TECHNIQUE: Contiguous axial images were obtained from the base of the skull through the vertex without intravenous contrast. COMPARISON:  None. FINDINGS: Brain: No evidence for acute brain infarct, intracranial hemorrhage or mass. Prominence of the sulci and ventricles are identified compatible with brain atrophy. Patchy areas of low attenuation throughout the subcortical and periventricular white matter are identified compatible with chronic microvascular disease. Vascular: No  hyperdense vessel or unexpected calcification. Skull: The osseous skull is intact. Sinuses/Orbits: There is mild mucosal thickening involving the left maxillary sinus. Other: None. IMPRESSION: 1. No acute intracranial abnormalities. 2. Chronic microvascular disease and brain atrophy. Electronically Signed   By: Kerby Moors M.D.   On: 01/28/2016 16:54   Dg Chest Port 1 View  Result Date: 01/28/2016 CLINICAL DATA:  Shortness of breath today. EXAM: PORTABLE CHEST 1 VIEW COMPARISON:  None. FINDINGS: Heart at the upper limits normal in size. There is atherosclerosis of the thoracic aorta. Mild interstitial prominence with a chronic appearance, however no prior exams for comparison. No pulmonary edema. Mild bibasilar atelectasis, right greater than left. No evidence pleural effusion. No pneumothorax. No osseous abnormality is seen. IMPRESSION: Borderline cardiomegaly with atherosclerosis of the thoracic aorta. Mild bibasilar atelectasis. Electronically Signed   By: Jeb Levering M.D.   On: 01/28/2016 19:16        Scheduled Meds: . [START ON 01/30/2016] amLODipine  7.5 mg Oral q morning - 10a  . aspirin  300 mg Rectal Daily   Or  . aspirin  325 mg Oral Daily  . [START ON 01/30/2016] atenolol  100 mg Oral Daily  . calcium carbonate  1 tablet Oral BID WC  . [START ON 01/30/2016] chlorthalidone  25 mg Oral Daily  . fluticasone  2 spray Each Nare Daily  . heparin  5,000 Units Subcutaneous Q8H  . iopamidol      . iopamidol      . losartan  100 mg Oral Daily  . Magnesium Gluconate  500 mg Oral Daily  . multivitamin with minerals  1 tablet Oral Daily  . pravastatin  40 mg Oral QPM  . vitamin C  500 mg Oral Daily   Continuous Infusions: . sodium chloride 100 mL/hr at 01/29/16 0945     LOS: 1 day    Time spent: 40 minutes     Dessa Phi, DO [facility] Pager [phone]  If 7PM-7AM, please contact night-coverage [email or web address] Password [email or web address] 01/29/2016, 5:15 PM

## 2016-01-29 NOTE — Progress Notes (Signed)
STROKE TEAM PROGRESS NOTE   SUBJECTIVE (INTERVAL HISTORY) Her daughter is at the bedside.  Overall she feels her condition is stable. She was referred her ophthalmologist for workup of left eye branch retinal artery occlusion. However, patient had difficulty describing her visual deficit. She was restless as she was complaining of back itching. MRI and CTA head and neck pending.   OBJECTIVE Temp:  [97.7 F (36.5 C)-99.1 F (37.3 C)] 99.1 F (37.3 C) (09/13 1330) Pulse Rate:  [51-70] 70 (09/13 1330) Cardiac Rhythm: Normal sinus rhythm (09/13 0830) Resp:  [15-24] 18 (09/13 1330) BP: (147-241)/(57-97) 181/62 (09/13 1330) SpO2:  [94 %-100 %] 96 % (09/13 1545) Weight:  [71.1 kg (156 lb 11.2 oz)] 71.1 kg (156 lb 11.2 oz) (09/12 2028)  No results for input(s): GLUCAP in the last 168 hours.  Recent Labs Lab 01/28/16 1629 01/28/16 1653  NA 142 141  K 3.7 3.7  CL 103 102  CO2 29  --   GLUCOSE 99 98  BUN 33* 35*  CREATININE 1.32* 1.40*  CALCIUM 10.1  --     Recent Labs Lab 01/28/16 1629  AST 23  ALT 15  ALKPHOS 43  BILITOT 0.7  PROT 7.3  ALBUMIN 4.1    Recent Labs Lab 01/28/16 1629 01/28/16 1653  WBC 7.4  --   NEUTROABS 4.5  --   HGB 14.0 14.3  HCT 43.0 42.0  MCV 94.7  --   PLT 189  --    No results for input(s): CKTOTAL, CKMB, CKMBINDEX, TROPONINI in the last 168 hours.  Recent Labs  01/28/16 1629  LABPROT 12.2  INR 0.91   No results for input(s): COLORURINE, LABSPEC, PHURINE, GLUCOSEU, HGBUR, BILIRUBINUR, KETONESUR, PROTEINUR, UROBILINOGEN, NITRITE, LEUKOCYTESUR in the last 72 hours.  Invalid input(s): APPERANCEUR     Component Value Date/Time   CHOL 174 01/29/2016 0443   TRIG 180 (H) 01/29/2016 0443   HDL 41 01/29/2016 0443   CHOLHDL 4.2 01/29/2016 0443   VLDL 36 01/29/2016 0443   LDLCALC 97 01/29/2016 0443   No results found for: HGBA1C No results found for: LABOPIA, COCAINSCRNUR, LABBENZ, AMPHETMU, THCU, LABBARB  No results for input(s): ETH in  the last 168 hours.  I have personally reviewed the radiological images below and agree with the radiology interpretations.  Ct Head Wo Contrast 01/28/2016 IMPRESSION: 1. No acute intracranial abnormalities. 2. Chronic microvascular disease and brain atrophy.   Dg Chest Port 1 View 01/28/2016 IMPRESSION: Borderline cardiomegaly with atherosclerosis of the thoracic aorta. Mild bibasilar atelectasis.  CTA head and neck pending  MRI brain pending  2D Echocardiogram   - Left ventricle: The cavity size was normal. Wall thickness was   normal. Systolic function was normal. The estimated ejection   fraction was in the range of 55% to 60%. Wall motion was normal;   there were no regional wall motion abnormalities. Doppler   parameters are consistent with abnormal left ventricular   relaxation (grade 1 diastolic dysfunction). - Mitral valve: Calcified annulus. Impressions: - No cardiac source of emboli was indentified.  EKG  junctional rhythm   PHYSICAL EXAM  Temp:  [97.7 F (36.5 C)-99.1 F (37.3 C)] 99.1 F (37.3 C) (09/13 1330) Pulse Rate:  [51-70] 70 (09/13 1330) Resp:  [15-24] 18 (09/13 1330) BP: (147-241)/(57-97) 181/62 (09/13 1330) SpO2:  [94 %-100 %] 96 % (09/13 1545) Weight:  [71.1 kg (156 lb 11.2 oz)] 71.1 kg (156 lb 11.2 oz) (09/12 2028)  General - Well nourished, well developed, in  mild distress due to back itching.  Ophthalmologic - fundi not visualized due to mild distress.  Cardiovascular - Regular rate and rhythm with no murmur, not in A. fib.  Mental Status -  Level of arousal and orientation to place, and person were intact, but not orientated to time. Language including expression, naming, repetition, comprehension was assessed and found intact. Fund of Knowledge was assessed and was impaired  Cranial Nerves II - XII - II - Visual field intact OD, upper half visual field deficit on the left. III, IV, VI - Extraocular movements intact, PERRL. V - Facial  sensation intact bilaterally. VII - Facial movement intact bilaterally, however, left fissural palpebrae bigger than the right, but equal strength on eye closure bilaterally. VIII - Hearing & vestibular intact bilaterally. X - Palate elevates symmetrically. XI - Chin turning & shoulder shrug intact bilaterally. XII - Tongue protrusion intact.  Motor Strength - The patient's strength was normal in all extremities and pronator drift was absent.  Bulk was normal and fasciculations were absent.   Motor Tone - Muscle tone was assessed at the neck and appendages and was normal.  Reflexes - The patient's reflexes were symmetrical in all extremities and she had no pathological reflexes.  Sensory - Light touch, temperature/pinprick were assessed and were symmetrical.    Coordination - The patient had normal movements in the hands with no ataxia or dysmetria.  Tremor was absent.  Gait and Station - not tested due to safety concerns.   ASSESSMENT/PLAN Ms. Clorine Swing is a 82 y.o. female with history of A. fib not on anticoagulation, HTN, HLD admitted for left visual deficit diagnosed with branch retinal artery occlusion. Symptoms unchanged.    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 fib not on anticoagulation  MRI  pending  CTA head and neck pending  2D Echo  EF 55-60%  LDL 97  HgbA1c pending  Heparin subcutaneous for VTE prophylaxis  Diet Heart Room service appropriate? Yes; Fluid consistency: Thin   aspirin 81 mg daily prior to admission, now on aspirin 325 mg daily. Would consider Eliquis 5mg  bid for anticoagulation if no large stroke on MRI.  Patient counseled to be compliant with her antithrombotic medications  Ongoing aggressive stroke risk factor management  Therapy recommendations:  Pending  Disposition:  Pending  Paroxysmal A. 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   Other Active Problems  Elevated creatinine  Other Pertinent History    Hospital day # 1   Rosalin Hawking, MD PhD Stroke Neurology 01/29/2016 4:32 PM    To contact Stroke Continuity provider, please refer to [email or web address] After hours, contact General Neurology

## 2016-01-29 NOTE — Progress Notes (Signed)
Patient called RN to room, stated she had chest pain. Patient rates chest pain a "4/10" in middle of chest, states it feels like pressure, increases in pain when patient breathes, she states she felt short of breath. Vitals as charted. Patient's spO2 desat to 78% on room air. Nasal cannula placed on 2L, increased to 5L, patient saturation at 96% SaO2. MD Maylene Roes was notified. EKG order placed, nurse tech is at bedside performing ECG. MD to come to bedside. Patient states her chest pain is remaining the same.

## 2016-01-29 NOTE — Progress Notes (Signed)
  Echocardiogram 2D Echocardiogram has been performed.  Valerie Terrell 01/29/2016, 12:10 PM

## 2016-01-29 NOTE — Progress Notes (Addendum)
PT Cancellation Note  Patient Details Name: Valerie Terrell MRN: [id number] DOB: August 13, 1932   Cancelled Treatment:    Reason Eval/Treat Not Completed: Patient declined, no reason specified Pt asleep snoring upon PT arrival. Daughter declined having PT wake up patient to perform PT evaluation as she just got comfortable. Will follow up as time allows.  Attempted in PM x2, however pt declined reporting she was tired. Will follow up tomorrow as time allows.   Marguarite Arbour A [employer] 01/29/2016, 10:54 AM  Wray Kearns, PT, DPT [phone]

## 2016-01-29 NOTE — Progress Notes (Addendum)
Addendum  Called by RN due to patient complaining of chest pain. EKG was ordered and I evaluated patient. She states that she started having "tightness" central chest pain 1-2 out of 10 (told RN 4). It has improved slightly prior to my arrival. Patient's pulse ox also fell down to 78% on room air and she was placed on [place] O2 at 5L for improvement. On my exam, she is slightly diaphoretic. Chest tightness is not reproducible to palpation. EKG showed new lateral lead depression that was not present on admission, no ST elevations. Troponin was ordered stat x 3 and CTA chest to r/o PE. Patient was given SL nitro x 1, morphine with improvement in chest pain to 1-2 out of 10. She was given another SL nitro. She also had significant nausea and was given zofran, then phenergan.   Will sign out to night team for close follow up and monitoring.   Dessa Phi, DO [facility] Pager [phone]  If 7PM-7AM, please contact night-coverage [email or web address] Password [facility] 01/29/2016, 7:06 PM     CTA unable to complete due to patient having CTA studies this afternoon, poor renal function and risk of contrast exposure. VQ ordered.   Patient's BP dropped into the 70s-80s after nitro. Improved to 99/34 then 121/43. Will transfer to stepdown for close monitoring. Daughter at bedside updated.   Dessa Phi, DO 01/29/2016, 7:28 PM

## 2016-01-29 NOTE — Evaluation (Signed)
SLP Cancellation Note  Patient Details Name: Valerie Terrell MRN: [id number] DOB: [birth year]   Cancelled treatment:       Reason Eval/Treat Not Completed: Other (comment) (pt reports nausea/headache, will reattempt at later time)   Luanna Salk, [facility] SLP [phone]

## 2016-01-29 NOTE — Progress Notes (Signed)
OT Cancellation Note  Patient Details Name: Valerie Terrell MRN: [id number] DOB: 04/19/1933   Cancelled Treatment:    Reason Eval/Treat Not Completed: Other (comment) (family request therapist not awake patient at this time) Ot to check back as time allows and most appropriate time for patient.   Vonita Moss   OTR/L Pager: [phone] Office: [phone] .  01/29/2016, 11:04 AM

## 2016-01-30 ENCOUNTER — Encounter (HOSPITAL_COMMUNITY): Payer: Self-pay | Admitting: *Deleted

## 2016-01-30 ENCOUNTER — Inpatient Hospital Stay (HOSPITAL_COMMUNITY): Payer: Medicare Other

## 2016-01-30 DIAGNOSIS — H34232 Retinal artery branch occlusion, left eye: Secondary | ICD-10-CM

## 2016-01-30 DIAGNOSIS — R079 Chest pain, unspecified: Secondary | ICD-10-CM | POA: Diagnosis not present

## 2016-01-30 DIAGNOSIS — I209 Angina pectoris, unspecified: Secondary | ICD-10-CM

## 2016-01-30 DIAGNOSIS — I480 Paroxysmal atrial fibrillation: Secondary | ICD-10-CM

## 2016-01-30 DIAGNOSIS — I639 Cerebral infarction, unspecified: Principal | ICD-10-CM

## 2016-01-30 DIAGNOSIS — R072 Precordial pain: Secondary | ICD-10-CM

## 2016-01-30 DIAGNOSIS — E785 Hyperlipidemia, unspecified: Secondary | ICD-10-CM | POA: Diagnosis present

## 2016-01-30 DIAGNOSIS — I10 Essential (primary) hypertension: Secondary | ICD-10-CM

## 2016-01-30 LAB — RAPID URINE DRUG SCREEN, HOSP PERFORMED
Amphetamines: NOT DETECTED
BENZODIAZEPINES: NOT DETECTED
Barbiturates: NOT DETECTED
COCAINE: NOT DETECTED
OPIATES: POSITIVE — AB
Tetrahydrocannabinol: NOT DETECTED

## 2016-01-30 LAB — CBC WITH DIFFERENTIAL/PLATELET
BASOS ABS: 0 10*3/uL (ref 0.0–0.1)
BASOS PCT: 0 %
Eosinophils Absolute: 0 10*3/uL (ref 0.0–0.7)
Eosinophils Relative: 0 %
HEMATOCRIT: 38.5 % (ref 36.0–46.0)
HEMOGLOBIN: 12.4 g/dL (ref 12.0–15.0)
LYMPHS PCT: 14 %
Lymphs Abs: 1.3 10*3/uL (ref 0.7–4.0)
MCH: 30.4 pg (ref 26.0–34.0)
MCHC: 32.2 g/dL (ref 30.0–36.0)
MCV: 94.4 fL (ref 78.0–100.0)
Monocytes Absolute: 0.8 10*3/uL (ref 0.1–1.0)
Monocytes Relative: 8 %
NEUTROS ABS: 7.2 10*3/uL (ref 1.7–7.7)
NEUTROS PCT: 78 %
Platelets: 147 10*3/uL — ABNORMAL LOW (ref 150–400)
RBC: 4.08 MIL/uL (ref 3.87–5.11)
RDW: 13.7 % (ref 11.5–15.5)
WBC: 9.2 10*3/uL (ref 4.0–10.5)

## 2016-01-30 LAB — URINALYSIS, ROUTINE W REFLEX MICROSCOPIC
Bilirubin Urine: NEGATIVE
GLUCOSE, UA: NEGATIVE mg/dL
HGB URINE DIPSTICK: NEGATIVE
Ketones, ur: NEGATIVE mg/dL
Nitrite: NEGATIVE
PH: 6 (ref 5.0–8.0)
Protein, ur: NEGATIVE mg/dL

## 2016-01-30 LAB — BASIC METABOLIC PANEL
ANION GAP: 8 (ref 5–15)
BUN: 29 mg/dL — ABNORMAL HIGH (ref 6–20)
CALCIUM: 9.3 mg/dL (ref 8.9–10.3)
CHLORIDE: 103 mmol/L (ref 101–111)
CO2: 25 mmol/L (ref 22–32)
Creatinine, Ser: 1.23 mg/dL — ABNORMAL HIGH (ref 0.44–1.00)
GFR calc non Af Amer: 40 mL/min — ABNORMAL LOW (ref >60)
GFR, EST AFRICAN AMERICAN: 47 mL/min — AB (ref >60)
GLUCOSE: 102 mg/dL — AB (ref 65–99)
POTASSIUM: 3.9 mmol/L (ref 3.5–5.1)
Sodium: 136 mmol/L (ref 135–145)

## 2016-01-30 LAB — TROPONIN I
TROPONIN I: 0.07 ng/mL — AB (ref <0.03)
TROPONIN I: 0.07 ng/mL — AB (ref <0.03)
Troponin I: 0.08 ng/mL (ref <0.03)

## 2016-01-30 LAB — URINE MICROSCOPIC-ADD ON

## 2016-01-30 LAB — HEMOGLOBIN A1C
HEMOGLOBIN A1C: 5.6 % (ref 4.8–5.6)
MEAN PLASMA GLUCOSE: 114 mg/dL

## 2016-01-30 MED ORDER — APIXABAN 5 MG PO TABS: 5.0000 mg | ORAL_TABLET | Freq: Two times a day (BID) | ORAL | Status: DC

## 2016-01-30 MED ORDER — APIXABAN 5 MG PO TABS
5.0000 mg | ORAL_TABLET | Freq: Two times a day (BID) | ORAL | Status: DC
  Administered 2016-01-30 – 2016-02-01 (×5): 5 mg via ORAL
  Filled 2016-01-30 (×5): qty 1

## 2016-01-30 NOTE — Progress Notes (Signed)
PROGRESS NOTE    Valerie Terrell  URK:[id number] DOB: [birth year] DOA: 01/28/2016 PCP: Amador Cunas, FNP      Brief Narrative:  Valerie Terrell is a 82 y.o. female with medical history significant for paroxysmal atrial fibrillation briefly on Xarelto prior to subarachnoid hemorrhage in 2016, hypertension, and hyperlipidemia who presents to the emergency department at the direction of her ophthalmologist for further evaluation and management of retinal artery branch occlusion involving the left eye. Patient reports that she had been in her usual state of health until the night of 01/21/2016 when she developed a severe left-sided retro-orbital headache. She began to develop blurred vision in the left eye and a MRI brain was performed on 01/24/2016 with no acute findings. With continued worsening in her symptoms, she was reevaluated by ophthalmology 9/12, diagnosed with left sided retinal artery branch occlusion, and treated in the clinic with laser therapy per the patient's report. She was then directed to the emergency department for further evaluation and management. Neurology was consulted.    Assessment & Plan:   Principal Problem:   Retinal artery branch occlusion of left eye Active Problems:   AKI (acute kidney injury) ([place])   Atrial fibrillation (HCC)   Hypertension   Headache   Stroke [facility])   Personal history of subarachnoid hemorrhage   CKD (chronic kidney disease), stage II   Acute CVA (cerebrovascular accident) ([place])  Left branch retinal artery occlusion and micro embolic infarctions in right occipital lobe and left parietal vertex  - Diagnosed just PTA and per pt report, laser treatment was performed in the ophthalmology clinic just prior to arrival here  - Secondary to distal emboli (pt has PAF), local atheroma, small vessel disease; GCA less likely (sed rate 22) - Head CT: negative - MRI of the brain performed on 01/24/2016 with and without contrast at [facility] -  Neurology following, appreciate recommendations   - Neuro checks  - Asa daily - reduce to 81mg  daily  - Continue statin  - PT, OT, SLP evals  - Echo: no cardiac source of emboli  - CTA head and neck with contrast: atherosclerosis of aorta  - MR brain: Scattered punctate acute infarctions consistent with micro embolic infarctions from the heart or ascending aorta. These are evident in the right occipital lobe an the left parietal vertex  - Spoke with neurology; will start Eliquis today as patient's symptoms are likely cardioembolic source. In the past, patient had taken just one tablet of Xarelto. She had a syncopal episode, hit her head, and suffered a subarachnoid hemorrhage. Patient, neurology, and I discussed with her the risks and benefits of starting on another anticoagulant to prevent further embolic events versus risk of bleeding. Patient has elected to start anticoagulation at this time.  Chest pain  - VQ negative for PE  - Cardiology consulted; will undergo lexiscan stress test tomorrow   Hypertension  - Managed with Norvasc, atenolol, cozaar and chlorthalidone at home  Paroxysmal atrial fibrillation  - In a junctional rhythm at time of admission  - CHADS-VASc at least 5 (age x2, gender, HTN, aortic plaque), 7 if current BRAO considered as CVA    - Followed by [facility] cardiology - Was on Xarelto (took 1 tablet) prior to suffering a traumatic SAH in June 2016  - Start Eliquis   Headache  - Likely secondary to HTN  - Less likely giant cell arteritis with sed rate 22   AKI superimposed on CKD stage Ii, improving  - Baseline Cr of ~1  -  Likely a prerenal azotemia in setting of recent poor oral intake  - Renal US negative - UA negative   Hx of SAH on Xarelto  - Occurred in June of 2016, after a fall, took 1 tablet of Xarelto only    DVT prophylaxis: eliquis  Code Status: Full  Family Communication: Family not present during exam. Disposition Plan: Patient to  undergo cardiac stress testing in the morning.   Consultants:  Neurology  Cardiology   Procedures:   Echo Study Conclusions - Left ventricle: The cavity size was normal. Wall thickness was   normal. Systolic function was normal. The estimated ejection   fraction was in the range of 55% to 60%. Wall motion was normal;   there were no regional wall motion abnormalities. Doppler   parameters are consistent with abnormal left ventricular   relaxation (grade 1 diastolic dysfunction). - Mitral valve: Calcified annulus.  Impressions: - No cardiac source of emboli was indentified.  Antimicrobials:   None     Subjective: Patient doing much better this morning. She denies any further chest pain since last night. She also denies any shortness of breath today. She also states that her vision is back to normal and denies any of the dark vision that she experienced previously. She denies any nausea, vomiting, abdominal pain today.  Objective: Vitals:   01/30/16 1000 01/30/16 1145 01/30/16 1156 01/30/16 1200  BP: (!) 151/59  (!) 137/56 (!) 142/56  Pulse: 71 75 68 69  Resp: 16 20 19 16   Temp:   99 F (37.2 C)   TempSrc:   Oral   SpO2: 100% 100% 100% 100%  Weight:      Height:        Intake/Output Summary (Last 24 hours) at 01/30/16 1404 Last data filed at 01/30/16 1145  Gross per 24 hour  Intake             1780 ml  Output              850 ml  Net              930 ml   Filed Weights   01/28/16 1549 01/28/16 2028 01/29/16 2009  Weight: 68 kg (150 lb) 71.1 kg (156 lb 11.2 oz) 70.6 kg (155 lb 10.3 oz)    Examination:  General exam: Appears calm and comfortable  Respiratory system: Clear to auscultation. Respiratory effort normal. Cardiovascular system: S1 & S2 heard, RRR. No JVD, murmurs, rubs, gallops or clicks. No pedal edema. Gastrointestinal system: Abdomen is nondistended, soft and nontender. No organomegaly or masses felt. Normal bowel sounds heard. Central nervous  system: Alert and oriented. No gross CN deficit. Extremities: Symmetric 5 x 5 power. Skin: No rashes, lesions or ulcers Psychiatry: Judgement and insight appear normal. Mood & affect appropriate.   Data Reviewed: I have personally reviewed following labs and imaging studies  CBC:  Recent Labs Lab 01/28/16 1629 01/28/16 1653 01/30/16 0356  WBC 7.4  --  9.2  NEUTROABS 4.5  --  7.2  HGB 14.0 14.3 12.4  HCT 43.0 42.0 38.5  MCV 94.7  --  94.4  PLT 189  --  875*   Basic Metabolic Panel:  Recent Labs Lab 01/28/16 1629 01/28/16 1653 01/30/16 0356  NA 142 141 136  K 3.7 3.7 3.9  CL 103 102 103  CO2 29  --  25  GLUCOSE 99 98 102*  BUN 33* 35* 29*  CREATININE 1.32* 1.40* 1.23*  CALCIUM 10.1  --  9.3   GFR: Estimated Creatinine Clearance: 32 mL/min (by C-G formula based on SCr of 1.23 mg/dL (H)). Liver Function Tests:  Recent Labs Lab 01/28/16 1629  AST 23  ALT 15  ALKPHOS 43  BILITOT 0.7  PROT 7.3  ALBUMIN 4.1   No results for input(s): LIPASE, AMYLASE in the last 168 hours. No results for input(s): AMMONIA in the last 168 hours. Coagulation Profile:  Recent Labs Lab 01/28/16 1629  INR 0.91   Cardiac Enzymes:  Recent Labs Lab 01/30/16 0027 01/30/16 0356 01/30/16 0711  TROPONINI 0.08* 0.07* 0.07*   BNP (last 3 results) No results for input(s): PROBNP in the last 8760 hours. HbA1C:  Recent Labs  01/29/16 0443  HGBA1C 5.6   CBG: No results for input(s): GLUCAP in the last 168 hours. Lipid Profile:  Recent Labs  01/29/16 0443  CHOL 174  HDL 41  LDLCALC 97  TRIG 180*  CHOLHDL 4.2   Thyroid Function Tests: No results for input(s): TSH, T4TOTAL, FREET4, T3FREE, THYROIDAB in the last 72 hours. Anemia Panel: No results for input(s): VITAMINB12, FOLATE, FERRITIN, TIBC, IRON, RETICCTPCT in the last 72 hours. Sepsis Labs: No results for input(s): PROCALCITON, LATICACIDVEN in the last 168 hours.  Recent Results (from the past 240 hour(s))    MRSA PCR Screening     Status: None   Collection Time: 01/29/16  8:13 PM  Result Value Ref Range Status   MRSA by PCR NEGATIVE NEGATIVE Final    Comment:        The GeneXpert MRSA Assay (FDA approved for NASAL specimens only), is one component of a comprehensive MRSA colonization surveillance program. It is not intended to diagnose MRSA infection nor to guide or monitor treatment for MRSA infections.          Radiology Studies: Ct Angio Head W Or Wo Contrast  Result Date: 01/29/2016 CLINICAL DATA:  Retinal branch artery occlusion on the left. Micro embolic infarctions on MRI. EXAM: CT ANGIOGRAPHY HEAD AND NECK TECHNIQUE: Multidetector CT imaging of the head and neck was performed using the standard protocol during bolus administration of intravenous contrast. Multiplanar CT image reconstructions and MIPs were obtained to evaluate the vascular anatomy. Carotid stenosis measurements (when applicable) are obtained utilizing NASCET criteria, using the distal internal carotid diameter as the denominator. CONTRAST:  50 cc Isovue 370 COMPARISON:  MRI same day FINDINGS: CT HEAD Generalized atrophy. Ordinary small vessel change of the cerebral hemispheric white matter. No sign of acute infarction, mass lesion, hemorrhage, hydrocephalus or extra-axial collection by CT. There is atherosclerotic calcification of the major vessels at the base of the brain. CTA NECK Aortic arch: Aortic atherosclerosis with extensive irregular plaque. Branching pattern of the brachiocephalic vessels from the arch is normal without flow limiting stenosis. Right carotid system: Common carotid artery widely patent to the bifurcation. Atherosclerotic disease of the carotid bifurcation with minimal diameter at the distal ICA bulb measuring 4 mm. Compared to a more distal cervical ICA diameter of 5 mm, this indicates a 20% stenosis. Left carotid system: Common carotid artery patent to the bifurcation. Atherosclerotic disease at  the carotid bifurcation with minimal diameter of the proximal ICA of 5 mm. Compared to a more distal cervical ICA diameter of 5 mm, there is no stenosis. Vertebral arteries:Both vertebral artery origins are widely patent. The vertebral arteries are approximately equal in size an widely patent through the neck. Skeleton: Ordinary spondylosis. Other neck: No mass or lymphadenopathy. Upper chest: Lung apices are clear. CTA HEAD  Anterior circulation: Both internal carotid arteries are patent through the skullbase. There is atherosclerotic calcification in the carotid siphon regions but no stenosis greater than 30%. Both ophthalmic arteries show flow. The anterior and middle cerebral vessels are patent without proximal stenosis, aneurysm or vascular malformation. Posterior circulation: Both vertebral arteries are patent through the foramen magnum. There is some atherosclerotic calcification but no stenosis. Both vertebral arteries are patent to the basilar. No basilar stenosis. Posterior circulation branch vessels appear normal. Venous sinuses: Patent and normal. Anatomic variants: None significant Delayed phase: No abnormal enhancement IMPRESSION: Given the micro embolic infarctions in both cerebral hemispheres, the most significant finding in this case is that of atherosclerosis of the aorta. There is irregular plaque, particularly notable in the descending aorta. The MRI pattern suggest micro embolic disease from the heart or ascending aorta. Atherosclerotic disease at both carotid bifurcations. 20% narrowing on the right and no narrowing on the left. Atherosclerotic disease in both carotid siphon regions but without stenosis greater than 30%. Electronically Signed   By: Nelson Chimes M.D.   On: 01/29/2016 18:07   Dg Chest 2 View  Result Date: 01/30/2016 CLINICAL DATA:  Pulmonary air embolism. EXAM: CHEST  2 VIEW COMPARISON:  Chest radiographs yesterday. FINDINGS: Lower lung volumes from prior exam accentuating the  cardiac silhouette. There is also crowding of bronchovascular markings. Bronchial thickening is likely accentuated by lower lung volumes. Development of bibasilar opacities from prior. No pleural fluid. No pneumothorax. IMPRESSION: Lower lung volumes from prior exam accentuating the cardiac size fat and bronchovascular markings. Bibasilar opacities, likely atelectasis, however aspiration or developing pneumonia could have a similar radiographic appearance. Electronically Signed   By: Jeb Levering M.D.   On: 01/30/2016 02:30   Ct Head Wo Contrast  Result Date: 01/28/2016 CLINICAL DATA:  Hypertension and ocular occlusion. Headache and dizziness and nausea. EXAM: CT HEAD WITHOUT CONTRAST TECHNIQUE: Contiguous axial images were obtained from the base of the skull through the vertex without intravenous contrast. COMPARISON:  None. FINDINGS: Brain: No evidence for acute brain infarct, intracranial hemorrhage or mass. Prominence of the sulci and ventricles are identified compatible with brain atrophy. Patchy areas of low attenuation throughout the subcortical and periventricular white matter are identified compatible with chronic microvascular disease. Vascular: No hyperdense vessel or unexpected calcification. Skull: The osseous skull is intact. Sinuses/Orbits: There is mild mucosal thickening involving the left maxillary sinus. Other: None. IMPRESSION: 1. No acute intracranial abnormalities. 2. Chronic microvascular disease and brain atrophy. Electronically Signed   By: Kerby Moors M.D.   On: 01/28/2016 16:54   Ct Angio Neck W Or Wo Contrast  Result Date: 01/29/2016 CLINICAL DATA:  Retinal branch artery occlusion on the left. Micro embolic infarctions on MRI. EXAM: CT ANGIOGRAPHY HEAD AND NECK TECHNIQUE: Multidetector CT imaging of the head and neck was performed using the standard protocol during bolus administration of intravenous contrast. Multiplanar CT image reconstructions and MIPs were obtained to  evaluate the vascular anatomy. Carotid stenosis measurements (when applicable) are obtained utilizing NASCET criteria, using the distal internal carotid diameter as the denominator. CONTRAST:  50 cc Isovue 370 COMPARISON:  MRI same day FINDINGS: CT HEAD Generalized atrophy. Ordinary small vessel change of the cerebral hemispheric white matter. No sign of acute infarction, mass lesion, hemorrhage, hydrocephalus or extra-axial collection by CT. There is atherosclerotic calcification of the major vessels at the base of the brain. CTA NECK Aortic arch: Aortic atherosclerosis with extensive irregular plaque. Branching pattern of the brachiocephalic vessels from the  arch is normal without flow limiting stenosis. Right carotid system: Common carotid artery widely patent to the bifurcation. Atherosclerotic disease of the carotid bifurcation with minimal diameter at the distal ICA bulb measuring 4 mm. Compared to a more distal cervical ICA diameter of 5 mm, this indicates a 20% stenosis. Left carotid system: Common carotid artery patent to the bifurcation. Atherosclerotic disease at the carotid bifurcation with minimal diameter of the proximal ICA of 5 mm. Compared to a more distal cervical ICA diameter of 5 mm, there is no stenosis. Vertebral arteries:Both vertebral artery origins are widely patent. The vertebral arteries are approximately equal in size an widely patent through the neck. Skeleton: Ordinary spondylosis. Other neck: No mass or lymphadenopathy. Upper chest: Lung apices are clear. CTA HEAD Anterior circulation: Both internal carotid arteries are patent through the skullbase. There is atherosclerotic calcification in the carotid siphon regions but no stenosis greater than 30%. Both ophthalmic arteries show flow. The anterior and middle cerebral vessels are patent without proximal stenosis, aneurysm or vascular malformation. Posterior circulation: Both vertebral arteries are patent through the foramen magnum.  There is some atherosclerotic calcification but no stenosis. Both vertebral arteries are patent to the basilar. No basilar stenosis. Posterior circulation branch vessels appear normal. Venous sinuses: Patent and normal. Anatomic variants: None significant Delayed phase: No abnormal enhancement IMPRESSION: Given the micro embolic infarctions in both cerebral hemispheres, the most significant finding in this case is that of atherosclerosis of the aorta. There is irregular plaque, particularly notable in the descending aorta. The MRI pattern suggest micro embolic disease from the heart or ascending aorta. Atherosclerotic disease at both carotid bifurcations. 20% narrowing on the right and no narrowing on the left. Atherosclerotic disease in both carotid siphon regions but without stenosis greater than 30%. Electronically Signed   By: Nelson Chimes M.D.   On: 01/29/2016 18:07   Mr Brain Wo Contrast  Result Date: 01/29/2016 CLINICAL DATA:  Headache. Left retinal artery branch occlusion. Blurred vision on the left. EXAM: MRI HEAD WITHOUT CONTRAST TECHNIQUE: Multiplanar, multiecho pulse sequences of the brain and surrounding structures were obtained without intravenous contrast. COMPARISON:  Head CT yesterday. FINDINGS: Brain: Diffusion imaging shows 2 punctate foci of acute infarction in the right occipital cortex an a punctate focus at the left parietal vertex, consistent with micro embolic infarctions from the heart or ascending aorta. No large vessel territory infarction. The brainstem and cerebellum are normal. Cerebral hemispheres otherwise show old small vessel infarctions affecting the deep and subcortical white matter in the right basal ganglia. No mass lesion, hemorrhage, hydrocephalus or extra-axial collection. Vascular: Major vessels at the base of the brain show flow. Skull and upper cervical spine: Negative Sinuses/Orbits: Sinuses clear.  No orbital abnormality seen by MR. Other: None IMPRESSION: Scattered  punctate acute infarctions consistent with micro embolic infarctions from the heart or ascending aorta. These are evident in the right occipital lobe an the left parietal vertex. Electronically Signed   By: Nelson Chimes M.D.   On: 01/29/2016 17:13   US Renal  Result Date: 01/29/2016 CLINICAL DATA:  Acute kidney injury. EXAM: RENAL / URINARY TRACT ULTRASOUND COMPLETE COMPARISON:  None. FINDINGS: Right Kidney: Length: 11.7 cm. Echogenicity within normal limits. 1.3 cm interpolar cyst. No solid mass or hydronephrosis visualized. Left Kidney: Length: 8.3 cm. Small and suboptimally visualized. No gross mass or hydronephrosis. Bladder: Appears normal for degree of bladder distention. IMPRESSION: 1. Atrophic/hypoplastic left kidney. 2. Small right renal cyst. 3. No hydronephrosis. Electronically Signed  By: Logan Bores M.D.   On: 01/29/2016 23:26   Nm Pulmonary Perf And Vent  Result Date: 01/30/2016 CLINICAL DATA:  Dyspnea, chest pain, onset tonight. EXAM: NUCLEAR MEDICINE VENTILATION - PERFUSION LUNG SCAN TECHNIQUE: Ventilation images were obtained in multiple projections using inhaled aerosol Tc-86m DTPA. Perfusion images were obtained in multiple projections after intravenous injection of Tc-46m MAA. RADIOPHARMACEUTICALS:  32.4 mCi Technetium-23m DTPA aerosol inhalation and 4.4 mCi Technetium-80m MAA IV COMPARISON:  Radiographs 01/29/2016 FINDINGS: Ventilation: No focal ventilation defect. Perfusion: No wedge shaped peripheral perfusion defects to suggest acute pulmonary embolism. No mismatched perfusion defects. IMPRESSION: No evidence of pulmonary embolism. Electronically Signed   By: Andreas Newport M.D.   On: 01/30/2016 00:38   Dg Chest Port 1 View  Result Date: 01/28/2016 CLINICAL DATA:  Shortness of breath today. EXAM: PORTABLE CHEST 1 VIEW COMPARISON:  None. FINDINGS: Heart at the upper limits normal in size. There is atherosclerosis of the thoracic aorta. Mild interstitial prominence with a  chronic appearance, however no prior exams for comparison. No pulmonary edema. Mild bibasilar atelectasis, right greater than left. No evidence pleural effusion. No pneumothorax. No osseous abnormality is seen. IMPRESSION: Borderline cardiomegaly with atherosclerosis of the thoracic aorta. Mild bibasilar atelectasis. Electronically Signed   By: Jeb Levering M.D.   On: 01/28/2016 19:16        Scheduled Meds: . amLODipine  7.5 mg Oral q morning - 10a  . apixaban  5 mg Oral BID  . atenolol  100 mg Oral Daily  . calcium carbonate  1 tablet Oral BID WC  . chlorthalidone  25 mg Oral Daily  . fluticasone  2 spray Each Nare Daily  . losartan  100 mg Oral Daily  . magnesium gluconate  500 mg Oral Daily  . mouth rinse  15 mL Mouth Rinse BID  . multivitamin with minerals  1 tablet Oral Daily  . pravastatin  40 mg Oral QPM  . vitamin C  500 mg Oral Daily   Continuous Infusions:     LOS: 2 days    Time spent: 30 minutes     Dessa Phi, DO [facility] Pager [phone]  If 7PM-7AM, please contact night-coverage [email or web address] Password [email or web address] 01/30/2016, 2:04 PM

## 2016-01-30 NOTE — Progress Notes (Signed)
CRITICAL VALUE ALERT  Critical value received:  Troponin 0.08  Date of notification:  01/30/2016  Time of notification:  7023  Critical value read back:Yes.    Nurse who received alert:  Epimenio Sarin, RN  MD notified (1st page):  Raliegh Ip. Schorr  Time of first page:  [phone]

## 2016-01-30 NOTE — Evaluation (Signed)
Physical Therapy Evaluation Patient Details Name: Valerie Terrell MRN: [id number] DOB: 1933/04/21 Today's Date: 01/30/2016   History of Present Illness  82 y.o. female with history of A. fib not on anticoagulation, HTN, HLD admitted for left visual deficit diagnosed with branch retinal artery occlusion. MRI revealed two punctate foci of acute infarction in the right occipital cortex and left parietal vertex, c/w micro emboli infarctions from heart or ascending aorta. Tranferred to stepdown 9/13 due to chest pain and VS changes.  Clinical Impression  Patient presents with decreased independence with mobility due to deficits listed in PT problem list.  She will benefit from skilled PT in the acute setting to allow return home with daughter assist and initially follow up HHPT.  Feel she will be able to transition to outpatient PT once tolerating increased activity.  Desaturated with ambulation on RA to 85%, able to recover with standing rest and pursed lip breathing to 90%, but once back in room at 87% needed to reapply O2 to improve >90%.  RN aware.    Follow Up Recommendations Supervision/Assistance - 24 hour;Home health PT (then progress to outpatient PT)    Equipment Recommendations   (youth walker)    Recommendations for Other Services       Precautions / Restrictions Precautions Precautions: Fall      Mobility  Bed Mobility Overal bed mobility: Needs Assistance Bed Mobility: Supine to Sit     Supine to sit: Supervision     General bed mobility comments: assist for lines  Transfers Overall transfer level: Needs assistance Equipment used: Rolling walker (2 wheeled);None Transfers: Sit to/from Stand Sit to Stand: Min guard;Supervision         General transfer comment: initially standing without device noted to be leaning posterior and bracing backs of legs against bedrail, with walker able to stand with UE support and leaning more  anterior  Ambulation/Gait Ambulation/Gait assistance: Min guard;Supervision Ambulation Distance (Feet): 220 Feet Assistive device: Rolling walker (2 wheeled) Gait Pattern/deviations: Step-through pattern;Decreased stride length     General Gait Details: cues for manuevering walker around obstacles; assist for balance at times  Stairs            Wheelchair Mobility    Modified Rankin (Stroke Patients Only) Modified Rankin (Stroke Patients Only) Pre-Morbid Rankin Score: No symptoms Modified Rankin: Moderately severe disability     Balance Overall balance assessment: Needs assistance   Sitting balance-Leahy Scale: Fair     Standing balance support: Bilateral upper extremity supported Standing balance-Leahy Scale: Poor Standing balance comment: UE support needed for balance                             Pertinent Vitals/Pain Pain Assessment: No/denies pain    Home Living Family/patient expects to be discharged to:: Private residence Living Arrangements: Children Available Help at Discharge: Family;Available 24 hours/day Type of Home: House Home Access: Stairs to enter Entrance Stairs-Rails: None Entrance Stairs-Number of Steps: 3 Home Layout: One level Home Equipment: None      Prior Function Level of Independence: Independent         Comments: 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)     Hand Dominance   Dominant Hand: Right    Extremity/Trunk Assessment   Upper Extremity Assessment: Overall WFL for tasks assessed           Lower Extremity Assessment: Overall WFL for tasks assessed  Communication   Communication: No difficulties  Cognition Arousal/Alertness: Awake/alert Behavior During Therapy: WFL for tasks assessed/performed Overall Cognitive Status: Within Functional Limits for tasks assessed                      General Comments General comments (skin integrity, edema, etc.): Noted pt able to correctly say  how many fingers with visual field testing, seems to be compensating; Ambulated on RA with SpO2 downt to 85%, back to 90% standing rest and PLB in hallway, then down to 87% in room with O2 reapplied at 2LPM    Exercises        Assessment/Plan    PT Assessment Patient needs continued PT services  PT Diagnosis Abnormality of gait   PT Problem List Decreased strength;Decreased activity tolerance;Decreased balance;Decreased mobility;Decreased safety awareness;Decreased knowledge of use of DME  PT Treatment Interventions DME instruction;Gait training;Stair training;Balance training;Functional mobility training;Neuromuscular re-education;Therapeutic exercise;Patient/family education;Therapeutic activities   PT Goals (Current goals can be found in the Care Plan section) Acute Rehab PT Goals Patient Stated Goal: To return to independent PT Goal Formulation: With patient Time For Goal Achievement: 02/06/16 Potential to Achieve Goals: Good    Frequency Min 4X/week   Barriers to discharge        Co-evaluation               End of Session Equipment Utilized During Treatment: Gait belt Activity Tolerance: Patient tolerated treatment well Patient left: in chair;with call bell/phone within reach           Time: 1439-1505 PT Time Calculation (min) (ACUTE ONLY): 26 min   Charges:   PT Evaluation $PT Eval Moderate Complexity: 1 Procedure PT Treatments $Gait Training: 8-22 mins   PT G CodesReginia Naas 02/07/2016, 4:06 PM  Magda Kiel, [place] 02-07-16

## 2016-01-30 NOTE — Progress Notes (Signed)
PT Cancellation Note  Patient Details Name: Parul Porcelli MRN: [id number] DOB: 1933/04/18   Cancelled Treatment:    Reason Eval/Treat Not Completed: Fatigue/lethargy limiting ability to participate; Patient just back to bed and requesting to rest.  Will attempt later this afternoon.    Reginia Naas 01/30/2016, 12:44 PM  Magda Kiel, [place] 01/30/2016

## 2016-01-30 NOTE — Evaluation (Signed)
  Speech Language Pathology Evaluation Patient Details Name: Valerie Terrell MRN: [id number] DOB: 1932/11/12 Today's Date: 01/30/2016 Time: 9935-7017 SLP Time Calculation (min) (ACUTE ONLY): 23 min  Problem List:  Patient Active Problem List   Diagnosis Date Noted  . Acute CVA (cerebrovascular accident) ([place]) 01/29/2016  . AKI (acute kidney injury) ([place]) 01/28/2016  . Atrial fibrillation ([place]) 01/28/2016  . Hypertension 01/28/2016  . Headache 01/28/2016  . Stroke ([place]) 01/28/2016  . 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 Personal history of subarachnoid hemorrhage 01/28/2016  . CKD (chronic kidney disease), stage II 01/28/2016   Past Medical History:  Past Medical History:  Diagnosis Date  . Atrial fibrillation ([place])   . Hyperlipidemia   . Hypertension   . Small kidney    left   Past Surgical History:  Past Surgical History:  Procedure Laterality Date  . CESAREAN SECTION    . TONSILLECTOMY     HPI:  82 y.o.femalewith history of A. fib not on anticoagulation, HTN, HLDadmitted for left visual deficit diagnosed with branch retinal artery occlusion. MRI revealed two punctate foci of acute infarction in the right occipital cortex and left parietal vertex, c/w micro emboli infarctions from heart or ascending aorta. Tranferred to stepdown 9/13 due to chest pain and VS changes.     Assessment / Plan / Recommendation Clinical Impression  Pt presents with higher-level deficits in visuospatial construction, selective attention, and storage/retrieval of new information s/p CVA.  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 OP may be best venue for f/u - will await OT and PT recommendations.  Discussed results/recs with pt and dtr, who verbalize agreement.     SLP Assessment  All further Speech  [place] Pathology  needs can be addressed in the next venue of care    Follow Up Recommendations  Outpatient SLP    Frequency and Duration           SLP Evaluation Prior Functioning  Cognitive/Linguistic Baseline: Within functional limits Type of Home: House  Lives With: Daughter Vocation: Part time employment   Cognition  Overall Cognitive Status: Impaired/Different from baseline Arousal/Alertness: Awake/alert Orientation Level: Oriented X4 Memory: Impaired Memory Impairment: Storage deficit;Retrieval deficit Awareness: Impaired Awareness Impairment: Emergent impairment Problem Solving: Impaired Problem Solving Impairment: Verbal complex    Comprehension  Auditory Comprehension Overall Auditory Comprehension: Appears within functional limits for tasks assessed Visual Recognition/Discrimination Discrimination: Within Function Limits Reading Comprehension Reading Status: Not tested    Expression Expression Primary Mode of Expression: Verbal Verbal Expression Overall Verbal Expression: Appears within functional limits for tasks assessed Written Expression Dominant Hand: Right Written Expression: Within Functional Limits   Oral / Motor  Oral Motor/Sensory Function Overall Oral Motor/Sensory Function: Mild impairment (mild left lower VII asymmetry) Motor Speech Overall Motor Speech: Appears within functional limits for tasks assessed   GO                    Valerie Terrell 01/30/2016, 11:08 AM

## 2016-01-30 NOTE — Consult Note (Signed)
Cardiology Consult    [name] MRN: [id number], DOB/AGE: [birth year]   Admit date: 01/28/2016 Date of Consult: 01/30/2016  Primary Physician: Amador Cunas, [place] Primary Cardiologist: New Requesting Provider: Dr. Maylene Roes Reason for Consultation: Chest pain  Patient Profile    82 yo female with PMH of PAF s/p SAH after fall on Xarelto, Hypertension, hyperlipidemia who presented to the [facility] emergency department with severe headache, and blurred vision in her left eye.  Past Medical History   Past Medical History:  Diagnosis Date  . Atrial fibrillation ([place])   . Hyperlipidemia   . Hypertension   . Small kidney    left    Past Surgical History:  Procedure Laterality Date  . CESAREAN SECTION    . TONSILLECTOMY       Allergies  Allergies  Allergen Reactions  . Sulfa Antibiotics Other (See Comments)    Reaction unknown  . Nickel Rash    History of Present Illness    Valerie Terrell is an 82 year old female with past medical history of paroxysmal atrial fibrillation who was initially diagnosed in June 2016 and placed on Xarelto Reports shortly after beginning this medication she had a fall and developed a subarachnoid hemorrhage, and decision was made to discontinue anticoagulation. States that she was under the care of cardiology during this event at [facility]. At that time she underwent a 2-D echo showing a normal EF of 60-65%, with mild AS and a moderately dilated left atrium along with grade 1 diastolic dysfunction. Only cardiac family history she reports is her father passing from an MI at age 54. She also reports that she was a smoker but quit over 40 years ago.  She reports being in her usual state of health which includes living independently with her daughter's assistance. States she is very active and works around her home and in her yard on a regular basis. She does not normally experience any anginal symptoms, or dyspnea on exertion.   She  reports being in her usual state of health up until 9/5 when she developed a severe left-sided retro-orbital headache, and subsequently developed blurred vision in her left eye. She underwent an MRI of her brain on 9/8 which showed no acute finding. Her symptoms did continue to worsen, and she was reevaluated by her ophthalmologist on 9/12 and diagnosed with a left-sided retinal artery branch occlusion that was treated with laser therapy per her report, but then she was then directed to the ED for further evaluation. Neurology is currently following her this admission.   She reports returning from multiple test yesterday evening, and developed a sudden onset of substernal chest pressure that she rated a 4 out of 10. She was given to sublingual nitroglycerin, and morphine with improvement of her pain. During this event her oxygen saturation level fell to 78% on room air and was placed on nasal cannula at 5 L with improvement. She underwent a VQ scan which was negative for PE. Her troponins were cycled and mildly elevated. Of note her blood pressure has been variable this admission with systolic blood pressure up to 188. After receiving sublingual nitroglycerin and morphine the patient's blood pressure dropped into the mid 70s, but then improved back into the 120s. Decision was made to transfer her to stepdown for close monitoring. She has not had any further episodes of chest pain. EKG showed normal sinus rhythm with slight change of minor ST depression in lateral leads.  Inpatient Medications    .  amLODipine  7.5 mg Oral q morning - 10a  . aspirin  300 mg Rectal Daily   Or  . aspirin  325 mg Oral Daily  . atenolol  100 mg Oral Daily  . calcium carbonate  1 tablet Oral BID WC  . chlorthalidone  25 mg Oral Daily  . fluticasone  2 spray Each Nare Daily  . heparin  5,000 Units Subcutaneous Q8H  . losartan  100 mg Oral Daily  . magnesium gluconate  500 mg Oral Daily  . mouth rinse  15 mL Mouth Rinse BID    . multivitamin with minerals  1 tablet Oral Daily  . pravastatin  40 mg Oral QPM  . vitamin C  500 mg Oral Daily    Family History    Family History  Problem Relation Age of Onset  . Sudden Cardiac Death Neg Hx     Social History    Social History   Social History  . Marital status: Widowed    Spouse name: N/A  . Number of children: N/A  . Years of education: N/A   Occupational History  . Not on file.   Social History Main Topics  . Smoking status: Not on file  . Smokeless tobacco: Never Used  . Alcohol use Yes     Comment: wine  . Drug use: No  . Sexual activity: Not on file   Other Topics Concern  . Not on file   Social History Narrative  . No narrative on file     Review of Systems    General:  No chills, fever, night sweats or weight changes.  Cardiovascular:  See HPI Dermatological: No rash, lesions/masses Respiratory: No cough, dyspnea Urologic: No hematuria, dysuria Abdominal:   No nausea, vomiting, diarrhea, bright red blood per rectum, melena, or hematemesis Neurologic: See HPI All other systems reviewed and are otherwise negative except as noted above.  Physical Exam    Blood pressure (!) 151/59, pulse 71, temperature 97.5 F (36.4 C), temperature source Oral, resp. rate 16, height 5' (1.524 m), weight 155 lb 10.3 oz (70.6 kg), SpO2 100 %.  General: Pleasant Older Caucasian female, NAD Psych: Normal affect. Neuro: Alert and oriented X 3. Moves all extremities spontaneously. HEENT: Normal  Neck: Supple without bruits or JVD. Lungs:  Resp regular and unlabored, CTA. Heart: RRR no s3, s4, or murmurs. Abdomen: Soft, non-tender, non-distended, BS + x 4.  Extremities: No clubbing, cyanosis, 1+ bilateral lower extremity edema. DP/PT/Radials 2+ and equal bilaterally.  Labs    Troponin [facility] Test)  Recent Labs  01/28/16 1651  TROPIPOC 0.03    Recent Labs  01/30/16 0027 01/30/16 0356 01/30/16 0711  TROPONINI 0.08* 0.07* 0.07*    Lab Results  Component Value Date   WBC 9.2 01/30/2016   HGB 12.4 01/30/2016   HCT 38.5 01/30/2016   MCV 94.4 01/30/2016   PLT 147 (L) 01/30/2016    Recent Labs Lab 01/28/16 1629  01/30/16 0356  NA 142  < > 136  K 3.7  < > 3.9  CL 103  < > 103  CO2 29  --  25  BUN 33*  < > 29*  CREATININE 1.32*  < > 1.23*  CALCIUM 10.1  --  9.3  PROT 7.3  --   --   BILITOT 0.7  --   --   ALKPHOS 43  --   --   ALT 15  --   --   AST 23  --   --  GLUCOSE 99  < > 102*  < > = values in this interval not displayed. Lab Results  Component Value Date   CHOL 174 01/29/2016   HDL 41 01/29/2016   LDLCALC 97 01/29/2016   TRIG 180 (H) 01/29/2016   No results found for: [facility]   Radiology Studies    Ct Angio Head W Or Wo Contrast  Result Date: 01/29/2016 CLINICAL DATA:  Retinal branch artery occlusion on the left. Micro embolic infarctions on MRI. EXAM: CT ANGIOGRAPHY HEAD AND NECK TECHNIQUE: Multidetector CT imaging of the head and neck was performed using the standard protocol during bolus administration of intravenous contrast. Multiplanar CT image reconstructions and MIPs were obtained to evaluate the vascular anatomy. Carotid stenosis measurements (when applicable) are obtained utilizing NASCET criteria, using the distal internal carotid diameter as the denominator. CONTRAST:  50 cc Isovue 370 COMPARISON:  MRI same day FINDINGS: CT HEAD Generalized atrophy. Ordinary small vessel change of the cerebral hemispheric white matter. No sign of acute infarction, mass lesion, hemorrhage, hydrocephalus or extra-axial collection by CT. There is atherosclerotic calcification of the major vessels at the base of the brain. CTA NECK Aortic arch: Aortic atherosclerosis with extensive irregular plaque. Branching pattern of the brachiocephalic vessels from the arch is normal without flow limiting stenosis. Right carotid system: Common carotid artery widely patent to the bifurcation. Atherosclerotic disease of the  carotid bifurcation with minimal diameter at the distal ICA bulb measuring 4 mm. Compared to a more distal cervical ICA diameter of 5 mm, this indicates a 20% stenosis. Left carotid system: Common carotid artery patent to the bifurcation. Atherosclerotic disease at the carotid bifurcation with minimal diameter of the proximal ICA of 5 mm. Compared to a more distal cervical ICA diameter of 5 mm, there is no stenosis. Vertebral arteries:Both vertebral artery origins are widely patent. The vertebral arteries are approximately equal in size an widely patent through the neck. Skeleton: Ordinary spondylosis. Other neck: No mass or lymphadenopathy. Upper chest: Lung apices are clear. CTA HEAD Anterior circulation: Both internal carotid arteries are patent through the skullbase. There is atherosclerotic calcification in the carotid siphon regions but no stenosis greater than 30%. Both ophthalmic arteries show flow. The anterior and middle cerebral vessels are patent without proximal stenosis, aneurysm or vascular malformation. Posterior circulation: Both vertebral arteries are patent through the foramen magnum. There is some atherosclerotic calcification but no stenosis. Both vertebral arteries are patent to the basilar. No basilar stenosis. Posterior circulation branch vessels appear normal. Venous sinuses: Patent and normal. Anatomic variants: None significant Delayed phase: No abnormal enhancement IMPRESSION: Given the micro embolic infarctions in both cerebral hemispheres, the most significant finding in this case is that of atherosclerosis of the aorta. There is irregular plaque, particularly notable in the descending aorta. The MRI pattern suggest micro embolic disease from the heart or ascending aorta. Atherosclerotic disease at both carotid bifurcations. 20% narrowing on the right and no narrowing on the left. Atherosclerotic disease in both carotid siphon regions but without stenosis greater than 30%.  Electronically Signed   By: Nelson Chimes M.D.   On: 01/29/2016 18:07   Dg Chest 2 View  Result Date: 01/30/2016 CLINICAL DATA:  Pulmonary air embolism. EXAM: CHEST  2 VIEW COMPARISON:  Chest radiographs yesterday. FINDINGS: Lower lung volumes from prior exam accentuating the cardiac silhouette. There is also crowding of bronchovascular markings. Bronchial thickening is likely accentuated by lower lung volumes. Development of bibasilar opacities from prior. No pleural fluid. No pneumothorax. IMPRESSION: Lower  lung volumes from prior exam accentuating the cardiac size fat and bronchovascular markings. Bibasilar opacities, likely atelectasis, however aspiration or developing pneumonia could have a similar radiographic appearance. Electronically Signed   By: Jeb Levering M.D.   On: 01/30/2016 02:30   Ct Head Wo Contrast  Result Date: 01/28/2016 CLINICAL DATA:  Hypertension and ocular occlusion. Headache and dizziness and nausea. EXAM: CT HEAD WITHOUT CONTRAST TECHNIQUE: Contiguous axial images were obtained from the base of the skull through the vertex without intravenous contrast. COMPARISON:  None. FINDINGS: Brain: No evidence for acute brain infarct, intracranial hemorrhage or mass. Prominence of the sulci and ventricles are identified compatible with brain atrophy. Patchy areas of low attenuation throughout the subcortical and periventricular white matter are identified compatible with chronic microvascular disease. Vascular: No hyperdense vessel or unexpected calcification. Skull: The osseous skull is intact. Sinuses/Orbits: There is mild mucosal thickening involving the left maxillary sinus. Other: None. IMPRESSION: 1. No acute intracranial abnormalities. 2. Chronic microvascular disease and brain atrophy. Electronically Signed   By: Kerby Moors M.D.   On: 01/28/2016 16:54   Ct Angio Neck W Or Wo Contrast  Result Date: 01/29/2016 CLINICAL DATA:  Retinal branch artery occlusion on the left. Micro  embolic infarctions on MRI. EXAM: CT ANGIOGRAPHY HEAD AND NECK TECHNIQUE: Multidetector CT imaging of the head and neck was performed using the standard protocol during bolus administration of intravenous contrast. Multiplanar CT image reconstructions and MIPs were obtained to evaluate the vascular anatomy. Carotid stenosis measurements (when applicable) are obtained utilizing NASCET criteria, using the distal internal carotid diameter as the denominator. CONTRAST:  50 cc Isovue 370 COMPARISON:  MRI same day FINDINGS: CT HEAD Generalized atrophy. Ordinary small vessel change of the cerebral hemispheric white matter. No sign of acute infarction, mass lesion, hemorrhage, hydrocephalus or extra-axial collection by CT. There is atherosclerotic calcification of the major vessels at the base of the brain. CTA NECK Aortic arch: Aortic atherosclerosis with extensive irregular plaque. Branching pattern of the brachiocephalic vessels from the arch is normal without flow limiting stenosis. Right carotid system: Common carotid artery widely patent to the bifurcation. Atherosclerotic disease of the carotid bifurcation with minimal diameter at the distal ICA bulb measuring 4 mm. Compared to a more distal cervical ICA diameter of 5 mm, this indicates a 20% stenosis. Left carotid system: Common carotid artery patent to the bifurcation. Atherosclerotic disease at the carotid bifurcation with minimal diameter of the proximal ICA of 5 mm. Compared to a more distal cervical ICA diameter of 5 mm, there is no stenosis. Vertebral arteries:Both vertebral artery origins are widely patent. The vertebral arteries are approximately equal in size an widely patent through the neck. Skeleton: Ordinary spondylosis. Other neck: No mass or lymphadenopathy. Upper chest: Lung apices are clear. CTA HEAD Anterior circulation: Both internal carotid arteries are patent through the skullbase. There is atherosclerotic calcification in the carotid siphon  regions but no stenosis greater than 30%. Both ophthalmic arteries show flow. The anterior and middle cerebral vessels are patent without proximal stenosis, aneurysm or vascular malformation. Posterior circulation: Both vertebral arteries are patent through the foramen magnum. There is some atherosclerotic calcification but no stenosis. Both vertebral arteries are patent to the basilar. No basilar stenosis. Posterior circulation branch vessels appear normal. Venous sinuses: Patent and normal. Anatomic variants: None significant Delayed phase: No abnormal enhancement IMPRESSION: Given the micro embolic infarctions in both cerebral hemispheres, the most significant finding in this case is that of atherosclerosis of the aorta. There is  irregular plaque, particularly notable in the descending aorta. The MRI pattern suggest micro embolic disease from the heart or ascending aorta. Atherosclerotic disease at both carotid bifurcations. 20% narrowing on the right and no narrowing on the left. Atherosclerotic disease in both carotid siphon regions but without stenosis greater than 30%. Electronically Signed   By: Nelson Chimes M.D.   On: 01/29/2016 18:07   Mr Brain Wo Contrast  Result Date: 01/29/2016 CLINICAL DATA:  Headache. Left retinal artery branch occlusion. Blurred vision on the left. EXAM: MRI HEAD WITHOUT CONTRAST TECHNIQUE: Multiplanar, multiecho pulse sequences of the brain and surrounding structures were obtained without intravenous contrast. COMPARISON:  Head CT yesterday. FINDINGS: Brain: Diffusion imaging shows 2 punctate foci of acute infarction in the right occipital cortex an a punctate focus at the left parietal vertex, consistent with micro embolic infarctions from the heart or ascending aorta. No large vessel territory infarction. The brainstem and cerebellum are normal. Cerebral hemispheres otherwise show old small vessel infarctions affecting the deep and subcortical white matter in the right basal  ganglia. No mass lesion, hemorrhage, hydrocephalus or extra-axial collection. Vascular: Major vessels at the base of the brain show flow. Skull and upper cervical spine: Negative Sinuses/Orbits: Sinuses clear.  No orbital abnormality seen by MR. Other: None IMPRESSION: Scattered punctate acute infarctions consistent with micro embolic infarctions from the heart or ascending aorta. These are evident in the right occipital lobe an the left parietal vertex. Electronically Signed   By: Nelson Chimes M.D.   On: 01/29/2016 17:13   US Renal  Result Date: 01/29/2016 CLINICAL DATA:  Acute kidney injury. EXAM: RENAL / URINARY TRACT ULTRASOUND COMPLETE COMPARISON:  None. FINDINGS: Right Kidney: Length: 11.7 cm. Echogenicity within normal limits. 1.3 cm interpolar cyst. No solid mass or hydronephrosis visualized. Left Kidney: Length: 8.3 cm. Small and suboptimally visualized. No gross mass or hydronephrosis. Bladder: Appears normal for degree of bladder distention. IMPRESSION: 1. Atrophic/hypoplastic left kidney. 2. Small right renal cyst. 3. No hydronephrosis. Electronically Signed   By: Logan Bores M.D.   On: 01/29/2016 23:26   Nm Pulmonary Perf And Vent  Result Date: 01/30/2016 CLINICAL DATA:  Dyspnea, chest pain, onset tonight. EXAM: NUCLEAR MEDICINE VENTILATION - PERFUSION LUNG SCAN TECHNIQUE: Ventilation images were obtained in multiple projections using inhaled aerosol Tc-55m DTPA. Perfusion images were obtained in multiple projections after intravenous injection of Tc-70m MAA. RADIOPHARMACEUTICALS:  32.4 mCi Technetium-43m DTPA aerosol inhalation and 4.4 mCi Technetium-7m MAA IV COMPARISON:  Radiographs 01/29/2016 FINDINGS: Ventilation: No focal ventilation defect. Perfusion: No wedge shaped peripheral perfusion defects to suggest acute pulmonary embolism. No mismatched perfusion defects. IMPRESSION: No evidence of pulmonary embolism. Electronically Signed   By: Andreas Newport M.D.   On: 01/30/2016 00:38    Dg Chest Port 1 View  Result Date: 01/28/2016 CLINICAL DATA:  Shortness of breath today. EXAM: PORTABLE CHEST 1 VIEW COMPARISON:  None. FINDINGS: Heart at the upper limits normal in size. There is atherosclerosis of the thoracic aorta. Mild interstitial prominence with a chronic appearance, however no prior exams for comparison. No pulmonary edema. Mild bibasilar atelectasis, right greater than left. No evidence pleural effusion. No pneumothorax. No osseous abnormality is seen. IMPRESSION: Borderline cardiomegaly with atherosclerosis of the thoracic aorta. Mild bibasilar atelectasis. Electronically Signed   By: Jeb Levering M.D.   On: 01/28/2016 19:16    ECG & Cardiac Imaging    EKG: normal sinus rhythm with change of minor ST depression in lateral leads.  Echo: 01/29/16  Study  Conclusions  - Left ventricle: The cavity size was normal. Wall thickness was   normal. Systolic function was normal. The estimated ejection   fraction was in the range of 55% to 60%. Wall motion was normal;   there were no regional wall motion abnormalities. Doppler   parameters are consistent with abnormal left ventricular   relaxation (grade 1 diastolic dysfunction). - Mitral valve: Calcified annulus.  Impressions:  - No cardiac source of emboli was indentified.   Assessment & Plan    82 yo female with PMH of PAF s/p SAH after fall on Xarelto, Hypertension, hyperlipidemia who presented to the [facility] emergency department with severe headache, and blurred vision in her left eye.  1. Chest pain with moderate risk for cardiac etiology: Reports being normally very physically active at home without any anginal symptoms, or dyspnea on exertion. Did have one episode of substernal chest pressure yesterday afternoon after returning from multiple scans, that she would rated a 4/10. This was relieved with 2 sublingual nitroglycerin and morphine, but blood pressure subsequently dropped into the 70s after  receiving these medications, but then improved. Denies having any further episodes of chest pain since. Troponins were cycled and noted at 0.08, EKG with change of minor ST depression in lateral leads.  -- 2-D echo this admission shows normal EF, with no wall motion abnormality and grade 1 diastolic dysfunction. Given her reported symptoms, mild troponin bump and nonspecific EKG changes she would benefit from further cardiac testing. We'll discuss with M.D. regarding further testing but likely proceed with Lexiscan Myoview to further evaluate for ischemia.  2. PAF: Currently in sinus rhythm. Was briefly placed on Xarelto last year when first diagnosis of PAF, but experienced a fall ( patient questions adverse reaction ) and subsequent subarachnoid hemorrhage, and decision was made to forego further anticoagulation. -- This patients CHA2DS2-VASc Score and unadjusted Ischemic Stroke Rate (% per year) is equal to 7.2 % stroke rate/year from a score of 5 Above score calculated as 1 point each if present [CHF, HTN, DM, Vascular=MI/PAD/Aortic Plaque, Age if 65-74, or Female] Above score calculated as 2 points each if present [Age > 75, or Stroke/TIA/TE] -- Currently on 325 aspirin daily, neurology following and recommend considering Eliquis 5 mg twice a day for further anticoagulation. If decision made to resume Eliquis would decrease aspirin to 81 mg.   3. Left branch retinal artery occlusion: Thought to be secondary to emboli from PAF. Underwent MRI yesterday showing scattered punctate acute infarctions consistent with micro embolic infarctions from the heart or ascending aorta. These are evident in the right occipital lobe an the left parietal vertex. -- Neurology following and recommend Eliquis 5mg  twice a day  4. HLD: Continue statin  5. HTN: BP has been variable this admission, would continue with home medications, and monitor  Signed, Reino Bellis, NP-C Pager [phone] 01/30/2016, 10:28 AM    Agree with note by Reino Bellis NP-C  Pt admitted for retinal artery occlusion thought to be embolic. H/O PAF in past on NOAC briefly with mechanical fall and subsequent SAH. Pt has + CRF for HTN , HLD and Fx. She had an episode of anginal CP which subsided with SL NTG ---> hypotension. Trop mildly elevated and flat. 2D nl. EKG w/o acute changes. Exam benign. Will get Lexiscan tomorrow to eval for CAD. I'm hesitant to put on a NOAC despite H/O PAF and recent embolic phenomenon given prior SAH.   Lorretta Harp, M.D., [place], [facility], [place], [place]  [facility] [address] [place], [place]  [id number]  [phone] 01/30/2016 1:43 PM

## 2016-01-30 NOTE — Evaluation (Signed)
Occupational Therapy Evaluation Patient Details Name: Valerie Terrell MRN: [id number] DOB: Jan 17, 1933 Today's Date: 01/30/2016    History of Present Illness 82 y.o. female with history of A. fib not on anticoagulation, HTN, HLD admitted for left visual deficit diagnosed with branch retinal artery occlusion. MRI revealed two punctate foci of acute infarction in the right occipital cortex and left parietal vertex, c/w micro emboli infarctions from heart or ascending aorta. Tranferred to stepdown 9/13 due to chest pain and VS changes.   Clinical Impression   Pt admitted with above. She demonstrates the below listed deficits and will benefit from continued OT to maximize safety and independence with BADLs.  Pt presents to OT with generalized weakness, cognitive impairment, mild balance impairment, and visual deficits.  She is able to perform ADLs at min guard assist level.  Recommend OPOT at discharge and follow up with ophthalmology to have visual fields formally re-assessed.       Follow Up Recommendations  Outpatient OT;Supervision/Assistance - 24 hour    Equipment Recommendations  Tub/shower seat    Recommendations for Other Services       Precautions / Restrictions Precautions Precautions: Fall      Mobility Bed Mobility Overal bed mobility: Needs Assistance Bed Mobility: Supine to Sit     Supine to sit: Supervision     General bed mobility comments: assist for lines  Transfers Overall transfer level: Needs assistance Equipment used: Rolling walker (2 wheeled);None Transfers: Sit to/from [facility] Sit to Stand: Min guard;Supervision         General transfer comment: Pt requires occasional guarding for balance     Balance Overall balance assessment: Needs assistance Sitting-balance support: Feet supported Sitting balance-Leahy Scale: Good     Standing balance support: During functional activity Standing balance-Leahy Scale: Fair Standing  balance comment: UE support needed for balance                            ADL Overall ADL's : Needs assistance/impaired Eating/Feeding: Independent   Grooming: Wash/dry hands;Wash/dry face;Oral care;Brushing hair;Min guard;Standing   Upper Body Bathing: Set up;Sitting   Lower Body Bathing: Min guard;Sit to/from stand   Upper Body Dressing : Set up;Sitting   Lower Body Dressing: Min guard;Sit to/from stand   Toilet Transfer: Min guard;Ambulation;Comfort height toilet;Grab bars;RW   Toileting- Water quality scientist and Hygiene: Min guard;Sit to/from stand       Functional mobility during ADLs: Min guard;Rolling walker General ADL Comments: requires cues for walker safety      Vision Vision Assessment?: Yes Eye Alignment: Within Functional Limits Alignment/Gaze Preference: Within Defined Limits Tracking/Visual Pursuits: Other (comment) Visual Fields: No apparent deficits Additional Comments: Pt frequently loses object in Lt superior quadrant, however, confrontation testing performed several times with no appreciable field deficit    Perception Perception Perception Tested?: Yes   Praxis Praxis Praxis tested?: Within functional limits    Pertinent Vitals/Pain Pain Assessment: No/denies pain     Hand Dominance Right   Extremity/Trunk Assessment Upper Extremity Assessment Upper Extremity Assessment: Overall WFL for tasks assessed   Lower Extremity Assessment Lower Extremity Assessment: Defer to PT evaluation       Communication Communication Communication: No difficulties   Cognition Arousal/Alertness: Awake/alert Behavior During Therapy: WFL for tasks assessed/performed Overall Cognitive Status: Impaired/Different from baseline Area of Impairment: Attention;Problem solving   Current Attention Level: Selective         Problem Solving: Slow processing;Difficulty sequencing;Requires verbal cues General  Comments: Pt requires cues for alternating  and divided attention.  She requires cues for safety awareness.  She made multiple errors when attempting serial subtraction by 2's, and unable to recognize errors    General Comments       Exercises       Shoulder Instructions      Home Living Family/patient expects to be discharged to:: Private residence Living Arrangements: Children Available Help at Discharge: Family;Available 24 hours/day Type of Home: House Home Access: Stairs to enter [employer] of Steps: 3 Entrance Stairs-Rails: None Home Layout: One level     Bathroom Shower/Tub: Teacher, early years/pre: Standard     Home Equipment: None      Lives With: Daughter    Prior Functioning/Environment Level of Independence: Independent        Comments: 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)    OT Diagnosis: Generalized weakness;Cognitive deficits;Disturbance of vision   OT Problem List: Decreased strength;Decreased activity tolerance;Impaired balance (sitting and/or standing);Decreased cognition;Decreased safety awareness;Decreased knowledge of use of DME or AE;Cardiopulmonary status limiting activity;Impaired vision/perception   OT Treatment/Interventions: Self-care/ADL training;DME and/or AE instruction;Therapeutic activities;Cognitive remediation/compensation;Visual/perceptual remediation/compensation;Patient/family education;Balance training    OT Goals(Current goals can be found in the care plan section) Acute Rehab OT Goals Patient Stated Goal: to return to normal  OT Goal Formulation: With patient Time For Goal Achievement: 02/13/16 Potential to Achieve Goals: Good ADL Goals Pt Will Perform Grooming: with modified independence;standing Pt Will Perform Upper Body Bathing: with modified independence;sitting Pt Will Perform Lower Body Bathing: with modified independence;sit to/from stand Pt Will Perform Upper Body Dressing: with modified independence;sitting Pt Will Perform Lower Body  Dressing: with modified independence;sit to/from stand Pt Will Transfer to Toilet: with modified independence;ambulating;regular height toilet;bedside commode;grab bars Pt Will Perform Toileting - Clothing Manipulation and hygiene: with modified independence;sit to/from stand Pt Will Perform Tub/Shower Transfer: Tub transfer;with supervision;ambulating;shower seat;rolling walker Additional ADL Goal #1: Pt will be able to alternate and divide attention between familiar tasks with min cues  Additional ADL Goal #2: Pt will perform money management with min cues   OT Frequency: Min 2X/week   Barriers to D/C:            Co-evaluation              End of Session Equipment Utilized During Treatment: Rolling walker;Oxygen Nurse Communication: Mobility status  Activity Tolerance: Patient tolerated treatment well Patient left: in chair;with call bell/phone within reach;with chair alarm set;with family/visitor present   Time: 9518-8416 OT Time Calculation (min): 26 min Charges:  OT General Charges $OT Visit: 1 Procedure OT Evaluation $OT Eval Moderate Complexity: 1 Procedure OT Treatments $Self Care/Home Management : 8-22 mins G-Codes:    Phillips Goulette M February 01, 2016, 5:49 PM

## 2016-01-30 NOTE — Assessment & Plan Note (Addendum)
After 1 tablet of xarelto per patient, syncopal episode with hitting her head

## 2016-01-30 NOTE — Progress Notes (Signed)
STROKE TEAM PROGRESS NOTE   SUBJECTIVE (INTERVAL HISTORY) Her daughter is at the bedside.  Patient was transferred to cardiac unit due to complaint of chest pain last night. Cardiac enzymes so far mild elevation, without trending up. EKG did not show ST elevation. MRI showed subacute bilateral punctate high convexity infarcts, likely the same embolic event with her branch retinal artery occlusion. Will recommend Eliquis for stroke prevention due to paroxysmal A. fib.   OBJECTIVE Temp:  [97.5 F (36.4 C)-99.1 F (37.3 C)] 97.5 F (36.4 C) (09/14 0806) Pulse Rate:  [60-95] 74 (09/14 0806) Cardiac Rhythm: Normal sinus rhythm (09/14 0700) Resp:  [14-26] 22 (09/14 0806) BP: (76-191)/(34-76) 152/56 (09/14 0806) SpO2:  [78 %-100 %] 100 % (09/14 0806) Weight:  [70.6 kg (155 lb 10.3 oz)] 70.6 kg (155 lb 10.3 oz) (09/13 2009)  No results for input(s): GLUCAP in the last 168 hours.  Recent Labs Lab 01/28/16 1629 01/28/16 1653 01/30/16 0356  NA 142 141 136  K 3.7 3.7 3.9  CL 103 102 103  CO2 29  --  25  GLUCOSE 99 98 102*  BUN 33* 35* 29*  CREATININE 1.32* 1.40* 1.23*  CALCIUM 10.1  --  9.3    Recent Labs Lab 01/28/16 1629  AST 23  ALT 15  ALKPHOS 43  BILITOT 0.7  PROT 7.3  ALBUMIN 4.1    Recent Labs Lab 01/28/16 1629 01/28/16 1653 01/30/16 0356  WBC 7.4  --  9.2  NEUTROABS 4.5  --  7.2  HGB 14.0 14.3 12.4  HCT 43.0 42.0 38.5  MCV 94.7  --  94.4  PLT 189  --  147*    Recent Labs Lab 01/30/16 0027 01/30/16 0356  TROPONINI 0.08* 0.07*    Recent Labs  01/28/16 1629  LABPROT 12.2  INR 0.91    Recent Labs  01/29/16 0123  COLORURINE YELLOW  LABSPEC >1.046*  PHURINE 6.0  GLUCOSEU NEGATIVE  HGBUR NEGATIVE  BILIRUBINUR NEGATIVE  KETONESUR NEGATIVE  PROTEINUR NEGATIVE  NITRITE NEGATIVE  LEUKOCYTESUR TRACE*       Component Value Date/Time   CHOL 174 01/29/2016 0443   TRIG 180 (H) 01/29/2016 0443   HDL 41 01/29/2016 0443   CHOLHDL 4.2 01/29/2016  0443   VLDL 36 01/29/2016 0443   LDLCALC 97 01/29/2016 0443   Lab Results  Component Value Date   HGBA1C 5.6 01/29/2016      Component Value Date/Time   LABOPIA POSITIVE (A) 01/30/2016 0123   COCAINSCRNUR NONE DETECTED 01/30/2016 0123   LABBENZ NONE DETECTED 01/30/2016 0123   AMPHETMU NONE DETECTED 01/30/2016 0123   THCU NONE DETECTED 01/30/2016 0123   LABBARB NONE DETECTED 01/30/2016 0123    No results for input(s): ETH in the last 168 hours.  I have personally reviewed the radiological images below and agree with the radiology interpretations.  Ct Head Wo Contrast 01/28/2016 IMPRESSION: 1. No acute intracranial abnormalities. 2. Chronic microvascular disease and brain atrophy.   Dg Chest Port 1 View 01/28/2016 IMPRESSION: Borderline cardiomegaly with atherosclerosis of the thoracic aorta. Mild bibasilar atelectasis.  CTA Head and Neck  01/29/2016 Given the micro embolic infarctions in both cerebral hemispheres, the most significant finding in this case is that of atherosclerosis of the aorta. There is irregular plaque, particularly notable in the descending aorta. The MRI pattern suggest micro embolic disease from the heart or ascending aorta. Atherosclerotic disease at both carotid bifurcations. 20% narrowing on the right and no narrowing on the left. Atherosclerotic disease in  both carotid siphon regions but without stenosis greater than 30%.  MRI brain 01/29/2016 Scattered punctate acute infarctions consistent with micro embolic infarctions from the heart or ascending aorta. These are evident in the right occipital lobe an the left parietal vertex. Likely subacute events.  2D Echocardiogram   - Left ventricle: The cavity size was normal. Wall thickness was   normal. Systolic function was normal. The estimated ejection   fraction was in the range of 55% to 60%. Wall motion was normal;   there were no regional wall motion abnormalities. Doppler   parameters are consistent  with abnormal left ventricular   relaxation (grade 1 diastolic dysfunction). - Mitral valve: Calcified annulus. Impressions: - No cardiac source of emboli was indentified.  EKG  junctional rhythm   PHYSICAL EXAM  Temp:  [97.5 F (36.4 C)-99.1 F (37.3 C)] 97.5 F (36.4 C) (09/14 0806) Pulse Rate:  [60-95] 74 (09/14 0806) Resp:  [14-26] 22 (09/14 0806) BP: (76-191)/(34-76) 152/56 (09/14 0806) SpO2:  [78 %-100 %] 100 % (09/14 0806) Weight:  [70.6 kg (155 lb 10.3 oz)] 70.6 kg (155 lb 10.3 oz) (09/13 2009)  General - Well nourished, well developed, not in acute distress.  Ophthalmologic - fundi not visualized due to mild distress.  Cardiovascular - Regular rate and rhythm with no murmur, not in A. fib.  Mental Status -  Level of arousal and orientation to place, time and person were intact. Language including expression, naming, repetition, comprehension was assessed and found intact. Fund of Knowledge was assessed and was impaired  Cranial Nerves II - XII - II - Visual field intact OD, upper half visual field deficit on the left. III, IV, VI - Extraocular movements intact, PERRL. V - Facial sensation intact bilaterally. VII - Facial movement intact bilaterally, however, left fissural palpebrae slightly bigger than the right, improved from yesterday, equal strength on eye closure bilaterally.  VIII - Hearing & vestibular intact bilaterally. X - Palate elevates symmetrically. XI - Chin turning & shoulder shrug intact bilaterally. XII - Tongue protrusion intact.  Motor Strength - The patient's strength was normal in all extremities and pronator drift was absent.  Bulk was normal and fasciculations were absent.   Motor Tone - Muscle tone was assessed at the neck and appendages and was normal.  Reflexes - The patient's reflexes were symmetrical in all extremities and she had no pathological reflexes.  Sensory - Light touch, temperature/pinprick were assessed and were  symmetrical.    Coordination - The patient had normal movements in the hands with no ataxia or dysmetria.  Tremor was absent.  Gait and Station - not tested due to safety concerns.   ASSESSMENT/PLAN Ms. Abigayle Wilinski is a 82 y.o. female with history of A. fib not on anticoagulation, HTN, HLD admitted for left visual deficit diagnosed with branch retinal artery occlusion. Symptoms unchanged.    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 fib not on anticoagulation  MRI - Scattered subacute punctate infarctions consistent with micro embolic infarctions  CTA Head and Neck - unremarkable except atherosclerosis of the aorta.   2D Echo  EF 55-60%  LDL 97  HgbA1c - 5.6  Heparin subcutaneous for VTE prophylaxis  Diet Heart Room service appropriate? Yes; Fluid consistency: Thin   aspirin 81 mg daily prior to admission, now on aspirin 325 mg daily. Recommend Eliquis 5mg  bid for anticoagulation for stroke prevention.   Patient counseled to be compliant with her antithrombotic medications  Ongoing  aggressive stroke risk factor management  Therapy recommendations:  Pending  Disposition:  Pending  Paroxysmal A. Fib  Once on Xarelto  Had a syncope event after initiation of Xarelto, suffered from traumatic SAH, Xarelto discontinued without resumption  Not sure if patient had adverse reaction with Xarelto  Recommend Eliquis 5mg  bid as traumatic brain hemorrhage is not a contraindication for anticoagulation  Chest pain  Cardiology on board, plan to do stress test  Mildly elevated troponin, stable trending  EKG no ST elevation  Agree with cardiology for aspirin 81 on top of Eliquis if indicated  Hypertension  Home meds:   Amlodipine, Tenoretic, losartan BP normotensive Currently on home medication  stable  Patient counseled to be compliant with her blood pressure medications  Hyperlipidemia  Home meds:  Pravastatin 40   Currently  on pravastatin 40  LDL 97, goal < 70  Continue statin at discharge  Other Stroke Risk Factors  Advanced age  Overweight, Body mass index is 30.4 kg/m.   Other Active Problems  Elevated creatinine -improved   Other Pertinent History    Hospital day # 2  Neurology will sign off. Please call with questions. Pt will follow up with Dr. Erlinda Hong at [facility] in about 6 weeks. Thanks for the consult.  Rosalin Hawking, MD PhD Stroke Neurology 01/30/2016 2:08 PM     To contact Stroke Continuity provider, please refer to [email or web address] After hours, contact General Neurology

## 2016-01-31 ENCOUNTER — Inpatient Hospital Stay (HOSPITAL_COMMUNITY): Payer: Medicare Other

## 2016-01-31 ENCOUNTER — Encounter (HOSPITAL_COMMUNITY): Payer: Self-pay | Admitting: *Deleted

## 2016-01-31 DIAGNOSIS — R079 Chest pain, unspecified: Secondary | ICD-10-CM

## 2016-01-31 DIAGNOSIS — E785 Hyperlipidemia, unspecified: Secondary | ICD-10-CM

## 2016-01-31 DIAGNOSIS — R0789 Other chest pain: Secondary | ICD-10-CM

## 2016-01-31 HISTORY — PX: CARDIOVASCULAR STRESS TEST: SHX262

## 2016-01-31 LAB — BASIC METABOLIC PANEL
Anion gap: 6 (ref 5–15)
BUN: 26 mg/dL — AB (ref 6–20)
CHLORIDE: 105 mmol/L (ref 101–111)
CO2: 26 mmol/L (ref 22–32)
CREATININE: 1.09 mg/dL — AB (ref 0.44–1.00)
Calcium: 9.3 mg/dL (ref 8.9–10.3)
GFR calc Af Amer: 54 mL/min — ABNORMAL LOW (ref >60)
GFR calc non Af Amer: 47 mL/min — ABNORMAL LOW (ref >60)
GLUCOSE: 99 mg/dL (ref 65–99)
Potassium: 3.6 mmol/L (ref 3.5–5.1)
Sodium: 137 mmol/L (ref 135–145)

## 2016-01-31 LAB — CBC WITH DIFFERENTIAL/PLATELET
Basophils Absolute: 0 10*3/uL (ref 0.0–0.1)
Basophils Relative: 0 %
EOS ABS: 0.1 10*3/uL (ref 0.0–0.7)
Eosinophils Relative: 1 %
HEMATOCRIT: 35.9 % — AB (ref 36.0–46.0)
HEMOGLOBIN: 11.2 g/dL — AB (ref 12.0–15.0)
LYMPHS ABS: 1.2 10*3/uL (ref 0.7–4.0)
Lymphocytes Relative: 14 %
MCH: 29.9 pg (ref 26.0–34.0)
MCHC: 31.2 g/dL (ref 30.0–36.0)
MCV: 95.7 fL (ref 78.0–100.0)
MONOS PCT: 13 %
Monocytes Absolute: 1.1 10*3/uL — ABNORMAL HIGH (ref 0.1–1.0)
NEUTROS ABS: 6.3 10*3/uL (ref 1.7–7.7)
NEUTROS PCT: 72 %
Platelets: 129 10*3/uL — ABNORMAL LOW (ref 150–400)
RBC: 3.75 MIL/uL — ABNORMAL LOW (ref 3.87–5.11)
RDW: 13.6 % (ref 11.5–15.5)
WBC: 8.6 10*3/uL (ref 4.0–10.5)

## 2016-01-31 LAB — NM MYOCAR MULTI W/SPECT W/WALL MOTION / EF
CHL CUP RESTING HR STRESS: 76 {beats}/min
CHL RATE OF PERCEIVED EXERTION: 0
CSEPEDS: 0 s
CSEPEW: 1 METS
Exercise duration (min): 0 min
MPHR: 140 {beats}/min
Peak HR: 103 {beats}/min
Percent HR: 73 %

## 2016-01-31 MED ORDER — TRAMADOL HCL 50 MG PO TABS
50.0000 mg | ORAL_TABLET | Freq: Four times a day (QID) | ORAL | Status: DC | PRN
  Administered 2016-01-31 (×2): 50 mg via ORAL
  Filled 2016-01-31 (×2): qty 1

## 2016-01-31 MED ORDER — REGADENOSON 0.4 MG/5ML IV SOLN
INTRAVENOUS | Status: AC
  Filled 2016-01-31: qty 5

## 2016-01-31 MED ORDER — MORPHINE SULFATE (PF) 2 MG/ML IV SOLN: 0.5000 mg | INTRAVENOUS | Status: DC | PRN

## 2016-01-31 MED ORDER — TECHNETIUM TC 99M TETROFOSMIN IV KIT
30.0000 | PACK | Freq: Once | INTRAVENOUS | Status: AC | PRN
  Administered 2016-01-31: 30 via INTRAVENOUS

## 2016-01-31 MED ORDER — SODIUM CHLORIDE 0.9 % IJ SOLN
80.0000 mg | INTRAVENOUS | Status: AC
  Administered 2016-01-31: 80 mg via INTRAVENOUS

## 2016-01-31 MED ORDER — TECHNETIUM TC 99M TETROFOSMIN IV KIT
10.0000 | PACK | Freq: Once | INTRAVENOUS | Status: AC | PRN
  Administered 2016-01-31: 10 via INTRAVENOUS

## 2016-01-31 MED ORDER — REGADENOSON 0.4 MG/5ML IV SOLN
0.4000 mg | Freq: Once | INTRAVENOUS | Status: AC
  Administered 2016-01-31: 0.4 mg via INTRAVENOUS
  Filled 2016-01-31: qty 5

## 2016-01-31 NOTE — Progress Notes (Signed)
Physical Therapy Treatment Patient Details Name: Valerie Terrell MRN: [id number] DOB: 02-21-1933 Today's Date: 01/31/2016    History of Present Illness 82 y.o. female with history of A. fib not on anticoagulation, HTN, HLD admitted for left visual deficit diagnosed with branch retinal artery occlusion. MRI revealed two punctate foci of acute infarction in the right occipital cortex and left parietal vertex, c/w micro emboli infarctions from heart or ascending aorta. Tranferred to stepdown 9/13 due to chest pain and VS changes.    PT Comments    Patient's balance and cognition improving. Continues to require 2L oxygen with activity (see separate O2 qualification note).   Follow Up Recommendations  Home health PT;Supervision/Assistance - 24 hour     Equipment Recommendations  None recommended by PT    Recommendations for Other Services       Precautions / Restrictions Precautions Precautions: Fall    Mobility  Bed Mobility Overal bed mobility: Modified Independent Bed Mobility: Supine to Sit;Sit to Supine     Supine to sit: Modified independent (Device/Increase time) Sit to supine: Modified independent (Device/Increase time)      Transfers Overall transfer level: Needs assistance Equipment used: None Transfers: Sit to/from Stand Sit to Stand: Supervision         General transfer comment: no imbalance or dizziness  Ambulation/Gait Ambulation/Gait assistance: Min guard Ambulation Distance (Feet): 375 Feet Assistive device: None Gait Pattern/deviations: Step-through pattern;Decreased stride length;Drifts right/left   Gait velocity interpretation: Below normal speed for age/gender General Gait Details: standing rest breaks x 3; no overt loss of balance; distracted by monitoring lines and partially drifts when trying to look at them or rearrange them   Stairs            Wheelchair Mobility    Modified Rankin (Stroke Patients Only)       Balance      Sitting balance-Leahy Scale: Good     Standing balance support: No upper extremity supported Standing balance-Leahy Scale: Good Standing balance comment: reaching outside her BOS                     Cognition Arousal/Alertness: Awake/alert Behavior During Therapy: WFL for tasks assessed/performed Overall Cognitive Status: Impaired/Different from baseline Area of Impairment: Problem solving             Problem Solving: Slow processing General Comments: Slow processing and seeming confusion re: multiple lines (perhaps just frustration)    Exercises      General Comments General comments (skin integrity, edema, etc.): Patient initially refusing due to fatigue and "hard day due to stress test." [name], RN was able to persuade patient to participate.      Pertinent Vitals/Pain Pain Assessment: No/denies pain    Home Living                      Prior Function            PT Goals (current goals can now be found in the care plan section) Acute Rehab PT Goals Patient Stated Goal: to return to normal  Time For Goal Achievement: 02/06/16 Progress towards PT goals: Progressing toward goals    Frequency    Min 4X/week      PT Plan Current plan remains appropriate    Co-evaluation             End of Session Equipment Utilized During Treatment: Gait belt Activity Tolerance: Treatment limited secondary to medical complications (Comment) (desaturation) Patient left: in  bed;with call bell/phone within reach;with family/visitor present     Time: 7253-6644 PT Time Calculation (min) (ACUTE ONLY): 29 min  Charges:  $Gait Training: 23-37 mins                    G Codes:      Valerie Terrell 2016/02/24, 2:44 PM Pager [phone]

## 2016-01-31 NOTE — Progress Notes (Signed)
OT Cancellation Note  Patient Details Name: Valerie Terrell MRN: [id number] DOB: 05-01-1933   Cancelled Treatment:    Reason Eval/Treat Not Completed: Patient at procedure or test/ unavailable  Vonita Moss   OTR/L Pager: [phone] Office: [phone] .  01/31/2016, 11:18 AM

## 2016-01-31 NOTE — Progress Notes (Signed)
lexiscan myoview completed with chest pain, rec'd aminophylline- stress portion only.

## 2016-01-31 NOTE — Care Management Note (Signed)
Case Management Note  Patient Details  Name: Valerie Terrell MRN: [id number] Date of Birth: 10/08/1932  Subjective/Objective:       Adm w retinal occlusion left eye             Action/Plan:lives w fam, gave pt 30day free eliquis card.   Expected Discharge Date:                  Expected Discharge Plan:  Home/Self Care  In-House Referral:     Discharge planning Services  CM Consult, Medication Assistance  Post Acute Care Choice:    Choice offered to:     DME Arranged:    DME Agency:     HH Arranged:    HH Agency:     Status of Service:  In process, will continue to follow  If discussed at Long Length of Stay Meetings, dates discussed:    Additional Comments:/W BRITTANY @ [employer] # [phone]   ELIQUIS 5 MG BID ( 30 ) 60 TAB   COVER- YES  CO-PAY- $45.00  TIER- 3 DRUG  PRIOR APPROVAL - YES # [phone]  PHARMACY : [facility], Franchot Gallo, RN 01/31/2016, 2:26 PM

## 2016-01-31 NOTE — Progress Notes (Signed)
82 yo female with PMH of PAF s/p SAH after fall on Xarelto, Hypertension, hyperlipidemia who presented to the [facility] emergency department with severe headache, and blurred vision in her left eye.  Subjective: Some SOB last PM  Objective: Vital signs in last 24 hours: Temp:  [97.8 F (36.6 C)-99 F (37.2 C)] 98.2 F (36.8 C) (09/15 0340) Pulse Rate:  [66-80] 73 (09/15 0908) Resp:  [15-24] 19 (09/15 0815) BP: (137-191)/(56-80) 182/76 (09/15 0908) SpO2:  [94 %-100 %] 100 % (09/15 0815) Weight change:  Last BM Date: 01/28/16 Intake/Output from previous day: 09/14 0701 - 09/15 0700 In: 480 [P.O.:480] Out: 600 [Urine:600] Intake/Output this shift: No intake/output data recorded.  PE: General:Pleasant affect, NAD Skin:Warm and dry, brisk capillary refill HEENT:normocephalic, sclera clear, mucus membranes moist Neck:supple, no JVD  Heart:S1S2 RRR without murmur, gallup, rub or click Lungs:diminished without rales, rhonchi, occ wheezes ZJQ:BHAL, non tender, + BS, do not palpate liver spleen or masses Ext:no lower ext edema to trace, 2+ pedal pulses, 2+ radial pulses Neuro:alert and oriented X 3, MAE, follows commands, + facial symmetry   Lab Results:  Recent Labs  01/30/16 0356 01/31/16 0333  WBC 9.2 8.6  HGB 12.4 11.2*  HCT 38.5 35.9*  PLT 147* 129*   BMET  Recent Labs  01/30/16 0356 01/31/16 0333  NA 136 137  K 3.9 3.6  CL 103 105  CO2 25 26  GLUCOSE 102* 99  BUN 29* 26*  CREATININE 1.23* 1.09*  CALCIUM 9.3 9.3    Recent Labs  01/30/16 0356 01/30/16 0711  TROPONINI 0.07* 0.07*    Lab Results  Component Value Date   CHOL 174 01/29/2016   HDL 41 01/29/2016   LDLCALC 97 01/29/2016   TRIG 180 (H) 01/29/2016   CHOLHDL 4.2 01/29/2016   Lab Results  Component Value Date   HGBA1C 5.6 01/29/2016     No results found for: TSH  Hepatic Function Panel  Recent Labs  01/28/16 1629  PROT 7.3  ALBUMIN 4.1  AST 23  ALT 15  ALKPHOS 43   BILITOT 0.7    Recent Labs  01/29/16 0443  CHOL 174   No results for input(s): PROTIME in the last 72 hours.     Studies/Results: Ct Angio Head W Or Wo Contrast  Result Date: 01/29/2016 CLINICAL DATA:  Retinal branch artery occlusion on the left. Micro embolic infarctions on MRI. EXAM: CT ANGIOGRAPHY HEAD AND NECK TECHNIQUE: Multidetector CT imaging of the head and neck was performed using the standard protocol during bolus administration of intravenous contrast. Multiplanar CT image reconstructions and MIPs were obtained to evaluate the vascular anatomy. Carotid stenosis measurements (when applicable) are obtained utilizing NASCET criteria, using the distal internal carotid diameter as the denominator. CONTRAST:  50 cc Isovue 370 COMPARISON:  MRI same day FINDINGS: CT HEAD Generalized atrophy. Ordinary small vessel change of the cerebral hemispheric white matter. No sign of acute infarction, mass lesion, hemorrhage, hydrocephalus or extra-axial collection by CT. There is atherosclerotic calcification of the major vessels at the base of the brain. CTA NECK Aortic arch: Aortic atherosclerosis with extensive irregular plaque. Branching pattern of the brachiocephalic vessels from the arch is normal without flow limiting stenosis. Right carotid system: Common carotid artery widely patent to the bifurcation. Atherosclerotic disease of the carotid bifurcation with minimal diameter at the distal ICA bulb measuring 4 mm. Compared to a more distal cervical ICA diameter of 5 mm, this indicates a 20%  stenosis. Left carotid system: Common carotid artery patent to the bifurcation. Atherosclerotic disease at the carotid bifurcation with minimal diameter of the proximal ICA of 5 mm. Compared to a more distal cervical ICA diameter of 5 mm, there is no stenosis. Vertebral arteries:Both vertebral artery origins are widely patent. The vertebral arteries are approximately equal in size an widely patent through the  neck. Skeleton: Ordinary spondylosis. Other neck: No mass or lymphadenopathy. Upper chest: Lung apices are clear. CTA HEAD Anterior circulation: Both internal carotid arteries are patent through the skullbase. There is atherosclerotic calcification in the carotid siphon regions but no stenosis greater than 30%. Both ophthalmic arteries show flow. The anterior and middle cerebral vessels are patent without proximal stenosis, aneurysm or vascular malformation. Posterior circulation: Both vertebral arteries are patent through the foramen magnum. There is some atherosclerotic calcification but no stenosis. Both vertebral arteries are patent to the basilar. No basilar stenosis. Posterior circulation branch vessels appear normal. Venous sinuses: Patent and normal. Anatomic variants: None significant Delayed phase: No abnormal enhancement IMPRESSION: Given the micro embolic infarctions in both cerebral hemispheres, the most significant finding in this case is that of atherosclerosis of the aorta. There is irregular plaque, particularly notable in the descending aorta. The MRI pattern suggest micro embolic disease from the heart or ascending aorta. Atherosclerotic disease at both carotid bifurcations. 20% narrowing on the right and no narrowing on the left. Atherosclerotic disease in both carotid siphon regions but without stenosis greater than 30%. Electronically Signed   By: Nelson Chimes M.D.   On: 01/29/2016 18:07   Dg Chest 2 View  Result Date: 01/30/2016 CLINICAL DATA:  Pulmonary air embolism. EXAM: CHEST  2 VIEW COMPARISON:  Chest radiographs yesterday. FINDINGS: Lower lung volumes from prior exam accentuating the cardiac silhouette. There is also crowding of bronchovascular markings. Bronchial thickening is likely accentuated by lower lung volumes. Development of bibasilar opacities from prior. No pleural fluid. No pneumothorax. IMPRESSION: Lower lung volumes from prior exam accentuating the cardiac size fat and  bronchovascular markings. Bibasilar opacities, likely atelectasis, however aspiration or developing pneumonia could have a similar radiographic appearance. Electronically Signed   By: Jeb Levering M.D.   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 Micro embolic infarctions on MRI. EXAM: CT ANGIOGRAPHY HEAD AND NECK TECHNIQUE: Multidetector CT imaging of the head and neck was performed using the standard protocol during bolus administration of intravenous contrast. Multiplanar CT image reconstructions and MIPs were obtained to evaluate the vascular anatomy. Carotid stenosis measurements (when applicable) are obtained utilizing NASCET criteria, using the distal internal carotid diameter as the denominator. CONTRAST:  50 cc Isovue 370 COMPARISON:  MRI same day FINDINGS: CT HEAD Generalized atrophy. Ordinary small vessel change of the cerebral hemispheric white matter. No sign of acute infarction, mass lesion, hemorrhage, hydrocephalus or extra-axial collection by CT. There is atherosclerotic calcification of the major vessels at the base of the brain. CTA NECK Aortic arch: Aortic atherosclerosis with extensive irregular plaque. Branching pattern of the brachiocephalic vessels from the arch is normal without flow limiting stenosis. Right carotid system: Common carotid artery widely patent to the bifurcation. Atherosclerotic disease of the carotid bifurcation with minimal diameter at the distal ICA bulb measuring 4 mm. Compared to a more distal cervical ICA diameter of 5 mm, this indicates a 20% stenosis. Left carotid system: Common carotid artery patent to the bifurcation. Atherosclerotic disease  at the carotid bifurcation with minimal diameter of the proximal ICA of 5 mm. Compared to a more distal cervical ICA diameter of 5 mm, there is no stenosis. Vertebral arteries:Both vertebral artery origins are widely patent.  The vertebral arteries are approximately equal in size an widely patent through the neck. Skeleton: Ordinary spondylosis. Other neck: No mass or lymphadenopathy. Upper chest: Lung apices are clear. CTA HEAD Anterior circulation: Both internal carotid arteries are patent through the skullbase. There is atherosclerotic calcification in the carotid siphon regions but no stenosis greater than 30%. Both ophthalmic arteries show flow. The anterior and middle cerebral vessels are patent without proximal stenosis, aneurysm or vascular malformation. Posterior circulation: Both vertebral arteries are patent through the foramen magnum. There is some atherosclerotic calcification but no stenosis. Both vertebral arteries are patent to the basilar. No basilar stenosis. Posterior circulation branch vessels appear normal. Venous sinuses: Patent and normal. Anatomic variants: None significant Delayed phase: No abnormal enhancement IMPRESSION: Given the micro embolic infarctions in both cerebral hemispheres, the most significant finding in this case is that of atherosclerosis of the aorta. There is irregular plaque, particularly notable in the descending aorta. The MRI pattern suggest micro embolic disease from the heart or ascending aorta. Atherosclerotic disease at both carotid bifurcations. 20% narrowing on the right and no narrowing on the left. Atherosclerotic disease in both carotid siphon regions but without stenosis greater than 30%. Electronically Signed   By: Nelson Chimes M.D.   On: 01/29/2016 18:07   Mr Brain Wo Contrast  Result Date: 01/29/2016 CLINICAL DATA:  Headache. Left retinal artery branch occlusion. Blurred vision on the left. EXAM: MRI HEAD WITHOUT CONTRAST TECHNIQUE: Multiplanar, multiecho pulse sequences of the brain and surrounding structures were obtained without intravenous contrast. COMPARISON:  Head CT yesterday. FINDINGS: Brain: Diffusion imaging shows 2 punctate foci of acute infarction in the right  occipital cortex an a punctate focus at the left parietal vertex, consistent with micro embolic infarctions from the heart or ascending aorta. No large vessel territory infarction. The brainstem and cerebellum are normal. Cerebral hemispheres otherwise show old small vessel infarctions affecting the deep and subcortical white matter in the right basal ganglia. No mass lesion, hemorrhage, hydrocephalus or extra-axial collection. Vascular: Major vessels at the base of the brain show flow. Skull and upper cervical spine: Negative Sinuses/Orbits: Sinuses clear.  No orbital abnormality seen by MR. Other: None IMPRESSION: Scattered punctate acute infarctions consistent with micro embolic infarctions from the heart or ascending aorta. These are evident in the right occipital lobe an the left parietal vertex. Electronically Signed   By: Nelson Chimes M.D.   On: 01/29/2016 17:13   US Renal  Result Date: 01/29/2016 CLINICAL DATA:  Acute kidney injury. EXAM: RENAL / URINARY TRACT ULTRASOUND COMPLETE COMPARISON:  None. FINDINGS: Right Kidney: Length: 11.7 cm. Echogenicity within normal limits. 1.3 cm interpolar cyst. No solid mass or hydronephrosis visualized. Left Kidney: Length: 8.3 cm. Small and suboptimally visualized. No gross mass or hydronephrosis. Bladder: Appears normal for degree of bladder distention. IMPRESSION: 1. Atrophic/hypoplastic left kidney. 2. Small right renal cyst. 3. No hydronephrosis. Electronically Signed   By: Logan Bores M.D.   On: 01/29/2016 23:26   Nm Pulmonary Perf And Vent  Result Date: 01/30/2016 CLINICAL DATA:  Dyspnea, chest pain, onset tonight. EXAM: NUCLEAR MEDICINE VENTILATION - PERFUSION LUNG SCAN TECHNIQUE: Ventilation images were obtained in multiple projections using inhaled aerosol Tc-11m DTPA. Perfusion images were obtained in multiple projections after intravenous injection of Tc-65m MAA.  RADIOPHARMACEUTICALS:  32.4 mCi Technetium-52m DTPA aerosol inhalation and 4.4 mCi  Technetium-44m MAA IV COMPARISON:  Radiographs 01/29/2016 FINDINGS: Ventilation: No focal ventilation defect. Perfusion: No wedge shaped peripheral perfusion defects to suggest acute pulmonary embolism. No mismatched perfusion defects. IMPRESSION: No evidence of pulmonary embolism. Electronically Signed   By: Andreas Newport M.D.   On: 01/30/2016 00:38    Medications: I have reviewed the patient's current medications. Scheduled Meds: . amLODipine  7.5 mg Oral q morning - 10a  . apixaban  5 mg Oral BID  . atenolol  100 mg Oral Daily  . calcium carbonate  1 tablet Oral BID WC  . chlorthalidone  25 mg Oral Daily  . fluticasone  2 spray Each Nare Daily  . losartan  100 mg Oral Daily  . magnesium gluconate  500 mg Oral Daily  . mouth rinse  15 mL Mouth Rinse BID  . multivitamin with minerals  1 tablet Oral Daily  . pravastatin  40 mg Oral QPM  . regadenoson      . regadenoson  0.4 mg Intravenous Once  . vitamin C  500 mg Oral Daily   Continuous Infusions:  PRN Meds:.acetaminophen, dicyclomine, hydrALAZINE, HYDROmorphone (DILAUDID) injection, nitroGLYCERIN, ondansetron (ZOFRAN) IV, promethazine, senna-docusate, technetium TC 48M diethylenetriame-pentaacetic acid  Assessment/Plan: Principal Problem:   Retinal artery branch occlusion of left eye Active Problems:   AKI (acute kidney injury) ([place])   Atrial fibrillation ([place])   Hypertension   Headache   Stroke [facility])   Personal history of subarachnoid hemorrhage   CKD (chronic kidney disease), stage II   Acute CVA (cerebrovascular accident) ([place])   Chest pain   HLD (hyperlipidemia)  1. Chest pain with moderate risk for cardiac etiology: Reports being normally very physically active at home without any anginal symptoms, or dyspnea on exertion. Did have one episode of substernal chest pressure yesterday afternoon after returning from multiple scans, that she would rated a 4/10. This was relieved with 2 sublingual nitroglycerin and morphine,  but blood pressure subsequently dropped into the 70s after receiving these medications, but then improved. Denies having any further episodes of chest pain since. Troponins were cycled and noted at 0.08, EKG with change of minor ST depression in lateral leads.  -- 2-D echo this admission shows normal EF, with no wall motion abnormality and grade 1 diastolic dysfunction. Given her reported symptoms, mild troponin bump and nonspecific EKG changes she would benefit from further cardiac testing. We'll discuss with M.D. regarding further testing will proceed with Lexiscan Myoview to further evaluate for ischemia.  2. PAF: Currently in sinus rhythm. Was briefly placed on Xarelto last year when first diagnosis of PAF, but experienced a fall ( patient questions adverse reaction ) and subsequent subarachnoid hemorrhage, and decision was made to forego further anticoagulation. -- This patients CHA2DS2-VASc Score and unadjusted Ischemic Stroke Rate (% per year) is equal to 7.2 % stroke rate/year from a score of 5 Above score calculated as 1 point each if present [CHF, HTN, DM, Vascular=MI/PAD/Aortic Plaque, Age if 65-74, or Female] Above score calculated as 2 points each if present [Age > 75, or Stroke/TIA/TE] -- Currently on 325 aspirin daily, neurology following and recommend considering Eliquis 5 mg twice a day for further anticoagulation. If decision made to resume Eliquis would decrease aspirin to 81 mg.   3. Left branch retinal artery occlusion: Thought to be secondary to emboli from PAF. Underwent MRI yesterday showing scattered punctate acute infarctions consistent with micro embolic infarctions from the  heart or ascending aorta. These are evident in the right occipital lobe an the left parietal vertex. -- Neurology following and recommend Eliquis 5mg  twice a day  4. HLD: Continue statin  5. HTN: BP has been variable this admission, would continue with home medications, and monitor    LOS: 3 days    Time spent with pt. :15 minutes. Cecilie Kicks  Nurse Practitioner Certified Pager [phone] or after 5pm and on weekends call [phone] 01/31/2016, 9:22 AM   Agree with note written by Cecilie Kicks RNP  Lexiscan nl. 2D with nl LV fxn. No AFIB on tele. Exam benign. No recurrent CP. OK to Tx to tele. Start Eliquis. I can see in F/U as OP.  Quay Burow 01/31/2016 12:22 PM

## 2016-01-31 NOTE — Progress Notes (Signed)
PROGRESS NOTE    Valerie Terrell  FBP:[id number] DOB: Dec 01, 1932 DOA: 01/28/2016 PCP: Amador Cunas, FNP      Brief Narrative:  Valerie Terrell is a 82 y.o. female with medical history significant for paroxysmal atrial fibrillation briefly on Xarelto prior to subarachnoid hemorrhage in 2016, hypertension, and hyperlipidemia who presents to the emergency department at the direction of her ophthalmologist for further evaluation and management of retinal artery branch occlusion involving the left eye. Patient reports that she had been in her usual state of health until the night of 01/21/2016 when she developed a severe left-sided retro-orbital headache. She began to develop blurred vision in the left eye and a MRI brain was performed on 01/24/2016 with no acute findings. With continued worsening in her symptoms, she was reevaluated by ophthalmology 9/12, diagnosed with left sided retinal artery branch occlusion, and treated in the clinic with laser therapy per the patient's report. She was then directed to the emergency department for further evaluation and management. Neurology was consulted.    Assessment & Plan:   Principal Problem:   Retinal artery branch occlusion of left eye Active Problems:   AKI (acute kidney injury) ([place])   Atrial fibrillation (HCC)   Hypertension   Headache   Stroke [facility])   Personal history of subarachnoid hemorrhage   CKD (chronic kidney disease), stage II   Acute CVA (cerebrovascular accident) ([place])   Chest pain   HLD (hyperlipidemia)  Left branch retinal artery occlusion and micro embolic infarctions in right occipital lobe and left parietal vertex  - Diagnosed just PTA and per pt report, laser treatment was performed in the ophthalmology clinic just prior to arrival here  - Secondary to distal emboli (pt has PAF), local atheroma, small vessel disease; GCA less likely (sed rate 22) - Head CT: negative - MRI of the brain performed on 01/24/2016 with and without  contrast at [facility] - Neurology following, appreciate recommendations   - Neuro checks  - Asa daily - reduce to 81mg  daily  - Continue statin  - PT, OT, SLP evals  - Echo: no cardiac source of emboli  - CTA head and neck with contrast: atherosclerosis of aorta  - MR brain: Scattered punctate acute infarctions consistent with micro embolic infarctions from the heart or ascending aorta. These are evident in the right occipital lobe an the left parietal vertex  - Spoke with neurology; will start Eliquis today as patient's symptoms are likely cardioembolic source. In the past, patient had taken just one tablet of Xarelto. She had a syncopal episode, hit her head, and suffered a subarachnoid hemorrhage. Patient, neurology, and I discussed with her the risks and benefits of starting on another anticoagulant to prevent further embolic events versus risk of bleeding. Patient has elected to start anticoagulation at this time.  Chest pain, now resolved  - VQ negative for PE  - Cardiology consulted - Lexiscan today; negative   Hypertension  - Managed with Norvasc, atenolol, cozaar and chlorthalidone at home  Paroxysmal atrial fibrillation  - In a junctional rhythm at time of admission  - CHADS-VASc at least 5 (age x2, gender, HTN, aortic plaque), 7 if current BRAO considered as CVA    - Followed by [facility] cardiology - Was on Xarelto (took 1 tablet) prior to suffering a traumatic SAH in June 2016  - Eliquis started   Headache  - Likely secondary to HTN  - Less likely giant cell arteritis with sed rate 22   AKI superimposed on CKD stage  Ii, improving  - Baseline Cr of ~1  - Likely a prerenal azotemia in setting of recent poor oral intake  - Renal US negative - UA negative   Hx of SAH on Xarelto  - Occurred in June of 2016, after a fall, took 1 tablet of Xarelto only    DVT prophylaxis: eliquis  Code Status: Full  Family Communication: Family not present during  exam. Disposition Plan: will monitor overnight. Discharge tomorrow.    Consultants:  Neurology  Cardiology   Procedures:   Echo Study Conclusions - Left ventricle: The cavity size was normal. Wall thickness was   normal. Systolic function was normal. The estimated ejection   fraction was in the range of 55% to 60%. Wall motion was normal;   there were no regional wall motion abnormalities. Doppler   parameters are consistent with abnormal left ventricular   relaxation (grade 1 diastolic dysfunction). - Mitral valve: Calcified annulus.  Impressions: - No cardiac source of emboli was indentified.  Antimicrobials:   None     Subjective: Patient doing well after stress test. She denies any further chest pain and breathing is much better. She also states that her vision is back to normal and denies any of the dark vision that she experienced previously. She denies any nausea, vomiting, abdominal pain today.  Objective: Vitals:   01/31/16 1147 01/31/16 1200 01/31/16 1312 01/31/16 1604  BP: (!) 185/84  (!) 151/58 (!) 159/64  Pulse: 70 69 67 65  Resp: 19  20 17   Temp: 98.3 F (36.8 C)   98.3 F (36.8 C)  TempSrc: Oral   Oral  SpO2: 100%  100% 97%  Weight:      Height:        Intake/Output Summary (Last 24 hours) at 01/31/16 1654 Last data filed at 01/31/16 1259  Gross per 24 hour  Intake              240 ml  Output              500 ml  Net             -260 ml   Filed Weights   01/28/16 1549 01/28/16 2028 01/29/16 2009  Weight: 68 kg (150 lb) 71.1 kg (156 lb 11.2 oz) 70.6 kg (155 lb 10.3 oz)    Examination:  General exam: Appears calm and comfortable  Respiratory system: Clear to auscultation. Respiratory effort normal. Cardiovascular system: S1 & S2 heard, RRR. No JVD, murmurs, rubs, gallops or clicks. No pedal edema. Gastrointestinal system: Abdomen is nondistended, soft and nontender. No organomegaly or masses felt. Normal bowel sounds heard. Central nervous  system: Alert and oriented. No gross CN deficit. Extremities: Symmetric 5 x 5 power. Skin: No rashes, lesions or ulcers Psychiatry: Judgement and insight appear normal. Mood & affect appropriate.   Data Reviewed: I have personally reviewed following labs and imaging studies  CBC:  Recent Labs Lab 01/28/16 1629 01/28/16 1653 01/30/16 0356 01/31/16 0333  WBC 7.4  --  9.2 8.6  NEUTROABS 4.5  --  7.2 6.3  HGB 14.0 14.3 12.4 11.2*  HCT 43.0 42.0 38.5 35.9*  MCV 94.7  --  94.4 95.7  PLT 189  --  147* 762*   Basic Metabolic Panel:  Recent Labs Lab 01/28/16 1629 01/28/16 1653 01/30/16 0356 01/31/16 0333  NA 142 141 136 137  K 3.7 3.7 3.9 3.6  CL 103 102 103 105  CO2 29  --  25 26  GLUCOSE 99 98 102* 99  BUN 33* 35* 29* 26*  CREATININE 1.32* 1.40* 1.23* 1.09*  CALCIUM 10.1  --  9.3 9.3   GFR: Estimated Creatinine Clearance: 36.1 mL/min (by C-G formula based on SCr of 1.09 mg/dL (H)). Liver Function Tests:  Recent Labs Lab 01/28/16 1629  AST 23  ALT 15  ALKPHOS 43  BILITOT 0.7  PROT 7.3  ALBUMIN 4.1   No results for input(s): LIPASE, AMYLASE in the last 168 hours. No results for input(s): AMMONIA in the last 168 hours. Coagulation Profile:  Recent Labs Lab 01/28/16 1629  INR 0.91   Cardiac Enzymes:  Recent Labs Lab 01/30/16 0027 01/30/16 0356 01/30/16 0711  TROPONINI 0.08* 0.07* 0.07*   BNP (last 3 results) No results for input(s): PROBNP in the last 8760 hours. HbA1C:  Recent Labs  01/29/16 0443  HGBA1C 5.6   CBG: No results for input(s): GLUCAP in the last 168 hours. Lipid Profile:  Recent Labs  01/29/16 0443  CHOL 174  HDL 41  LDLCALC 97  TRIG 180*  CHOLHDL 4.2   Thyroid Function Tests: No results for input(s): TSH, T4TOTAL, FREET4, T3FREE, THYROIDAB in the last 72 hours. Anemia Panel: No results for input(s): VITAMINB12, FOLATE, FERRITIN, TIBC, IRON, RETICCTPCT in the last 72 hours. Sepsis Labs: No results for input(s):  PROCALCITON, LATICACIDVEN in the last 168 hours.  Recent Results (from the past 240 hour(s))  MRSA PCR Screening     Status: None   Collection Time: 01/29/16  8:13 PM  Result Value Ref Range Status   MRSA by PCR NEGATIVE NEGATIVE Final    Comment:        The GeneXpert MRSA Assay (FDA approved for NASAL specimens only), is one component of a comprehensive MRSA colonization surveillance program. It is not intended to diagnose MRSA infection nor to guide or monitor treatment for MRSA infections.          Radiology Studies: Ct Angio Head W Or Wo Contrast  Result Date: 01/29/2016 CLINICAL DATA:  Retinal branch artery occlusion on the left. Micro embolic infarctions on MRI. EXAM: CT ANGIOGRAPHY HEAD AND NECK TECHNIQUE: Multidetector CT imaging of the head and neck was performed using the standard protocol during bolus administration of intravenous contrast. Multiplanar CT image reconstructions and MIPs were obtained to evaluate the vascular anatomy. Carotid stenosis measurements (when applicable) are obtained utilizing NASCET criteria, using the distal internal carotid diameter as the denominator. CONTRAST:  50 cc Isovue 370 COMPARISON:  MRI same day FINDINGS: CT HEAD Generalized atrophy. Ordinary small vessel change of the cerebral hemispheric white matter. No sign of acute infarction, mass lesion, hemorrhage, hydrocephalus or extra-axial collection by CT. There is atherosclerotic calcification of the major vessels at the base of the brain. CTA NECK Aortic arch: Aortic atherosclerosis with extensive irregular plaque. Branching pattern of the brachiocephalic vessels from the arch is normal without flow limiting stenosis. Right carotid system: Common carotid artery widely patent to the bifurcation. Atherosclerotic disease of the carotid bifurcation with minimal diameter at the distal ICA bulb measuring 4 mm. Compared to a more distal cervical ICA diameter of 5 mm, this indicates a 20% stenosis.  Left carotid system: Common carotid artery patent to the bifurcation. Atherosclerotic disease at the carotid bifurcation with minimal diameter of the proximal ICA of 5 mm. Compared to a more distal cervical ICA diameter of 5 mm, there is no stenosis. Vertebral arteries:Both vertebral artery origins are widely patent. The vertebral arteries are approximately equal in size  an widely patent through the neck. Skeleton: Ordinary spondylosis. Other neck: No mass or lymphadenopathy. Upper chest: Lung apices are clear. CTA HEAD Anterior circulation: Both internal carotid arteries are patent through the skullbase. There is atherosclerotic calcification in the carotid siphon regions but no stenosis greater than 30%. Both ophthalmic arteries show flow. The anterior and middle cerebral vessels are patent without proximal stenosis, aneurysm or vascular malformation. Posterior circulation: Both vertebral arteries are patent through the foramen magnum. There is some atherosclerotic calcification but no stenosis. Both vertebral arteries are patent to the basilar. No basilar stenosis. Posterior circulation branch vessels appear normal. Venous sinuses: Patent and normal. Anatomic variants: None significant Delayed phase: No abnormal enhancement IMPRESSION: Given the micro embolic infarctions in both cerebral hemispheres, the most significant finding in this case is that of atherosclerosis of the aorta. There is irregular plaque, particularly notable in the descending aorta. The MRI pattern suggest micro embolic disease from the heart or ascending aorta. Atherosclerotic disease at both carotid bifurcations. 20% narrowing on the right and no narrowing on the left. Atherosclerotic disease in both carotid siphon regions but without stenosis greater than 30%. Electronically Signed   By: Nelson Chimes M.D.   On: 01/29/2016 18:07   Dg Chest 2 View  Result Date: 01/30/2016 CLINICAL DATA:  Pulmonary air embolism. EXAM: CHEST  2 VIEW  COMPARISON:  Chest radiographs yesterday. FINDINGS: Lower lung volumes from prior exam accentuating the cardiac silhouette. There is also crowding of bronchovascular markings. Bronchial thickening is likely accentuated by lower lung volumes. Development of bibasilar opacities from prior. No pleural fluid. No pneumothorax. IMPRESSION: Lower lung volumes from prior exam accentuating the cardiac size fat and bronchovascular markings. Bibasilar opacities, likely atelectasis, however aspiration or developing pneumonia could have a similar radiographic appearance. Electronically Signed   By: Jeb Levering M.D.   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 Micro embolic infarctions on MRI. EXAM: CT ANGIOGRAPHY HEAD AND NECK TECHNIQUE: Multidetector CT imaging of the head and neck was performed using the standard protocol during bolus administration of intravenous contrast. Multiplanar CT image reconstructions and MIPs were obtained to evaluate the vascular anatomy. Carotid stenosis measurements (when applicable) are obtained utilizing NASCET criteria, using the distal internal carotid diameter as the denominator. CONTRAST:  50 cc Isovue 370 COMPARISON:  MRI same day FINDINGS: CT HEAD Generalized atrophy. Ordinary small vessel change of the cerebral hemispheric white matter. No sign of acute infarction, mass lesion, hemorrhage, hydrocephalus or extra-axial collection by CT. There is atherosclerotic calcification of the major vessels at the base of the brain. CTA NECK Aortic arch: Aortic atherosclerosis with extensive irregular plaque. Branching pattern of the brachiocephalic vessels from the arch is normal without flow limiting stenosis. Right carotid system: Common carotid artery widely patent to the bifurcation. Atherosclerotic disease of the carotid bifurcation with minimal diameter at the distal ICA bulb measuring 4  mm. Compared to a more distal cervical ICA diameter of 5 mm, this indicates a 20% stenosis. Left carotid system: Common carotid artery patent to the bifurcation. Atherosclerotic disease at the carotid bifurcation with minimal diameter of the proximal ICA of 5 mm. Compared to a more distal cervical ICA diameter of 5 mm, there is no stenosis. Vertebral arteries:Both vertebral artery origins are widely patent. The vertebral arteries are approximately equal in size an widely patent through the neck. Skeleton: Ordinary spondylosis. Other neck: No mass  or lymphadenopathy. Upper chest: Lung apices are clear. CTA HEAD Anterior circulation: Both internal carotid arteries are patent through the skullbase. There is atherosclerotic calcification in the carotid siphon regions but no stenosis greater than 30%. Both ophthalmic arteries show flow. The anterior and middle cerebral vessels are patent without proximal stenosis, aneurysm or vascular malformation. Posterior circulation: Both vertebral arteries are patent through the foramen magnum. There is some atherosclerotic calcification but no stenosis. Both vertebral arteries are patent to the basilar. No basilar stenosis. Posterior circulation branch vessels appear normal. Venous sinuses: Patent and normal. Anatomic variants: None significant Delayed phase: No abnormal enhancement IMPRESSION: Given the micro embolic infarctions in both cerebral hemispheres, the most significant finding in this case is that of atherosclerosis of the aorta. There is irregular plaque, particularly notable in the descending aorta. The MRI pattern suggest micro embolic disease from the heart or ascending aorta. Atherosclerotic disease at both carotid bifurcations. 20% narrowing on the right and no narrowing on the left. Atherosclerotic disease in both carotid siphon regions but without stenosis greater than 30%. Electronically Signed   By: Nelson Chimes M.D.   On: 01/29/2016 18:07   Mr Brain Wo  Contrast  Result Date: 01/29/2016 CLINICAL DATA:  Headache. Left retinal artery branch occlusion. Blurred vision on the left. EXAM: MRI HEAD WITHOUT CONTRAST TECHNIQUE: Multiplanar, multiecho pulse sequences of the brain and surrounding structures were obtained without intravenous contrast. COMPARISON:  Head CT yesterday. FINDINGS: Brain: Diffusion imaging shows 2 punctate foci of acute infarction in the right occipital cortex an a punctate focus at the left parietal vertex, consistent with micro embolic infarctions from the heart or ascending aorta. No large vessel territory infarction. The brainstem and cerebellum are normal. Cerebral hemispheres otherwise show old small vessel infarctions affecting the deep and subcortical white matter in the right basal ganglia. No mass lesion, hemorrhage, hydrocephalus or extra-axial collection. Vascular: Major vessels at the base of the brain show flow. Skull and upper cervical spine: Negative Sinuses/Orbits: Sinuses clear.  No orbital abnormality seen by MR. Other: None IMPRESSION: Scattered punctate acute infarctions consistent with micro embolic infarctions from the heart or ascending aorta. These are evident in the right occipital lobe an the left parietal vertex. Electronically Signed   By: Nelson Chimes M.D.   On: 01/29/2016 17:13   US Renal  Result Date: 01/29/2016 CLINICAL DATA:  Acute kidney injury. EXAM: RENAL / URINARY TRACT ULTRASOUND COMPLETE COMPARISON:  None. FINDINGS: Right Kidney: Length: 11.7 cm. Echogenicity within normal limits. 1.3 cm interpolar cyst. No solid mass or hydronephrosis visualized. Left Kidney: Length: 8.3 cm. Small and suboptimally visualized. No gross mass or hydronephrosis. Bladder: Appears normal for degree of bladder distention. IMPRESSION: 1. Atrophic/hypoplastic left kidney. 2. Small right renal cyst. 3. No hydronephrosis. Electronically Signed   By: Logan Bores M.D.   On: 01/29/2016 23:26   Nm Myocar Multi W/spect W/wall Motion  / Ef  Result Date: 01/31/2016 CLINICAL DATA:  Chest pain, atrial fibrillation, hypertension and elevated troponin. EXAM: MYOCARDIAL IMAGING WITH SPECT (REST AND PHARMACOLOGIC-STRESS) GATED LEFT VENTRICULAR WALL MOTION STUDY LEFT VENTRICULAR EJECTION FRACTION TECHNIQUE: Standard myocardial SPECT imaging was performed after resting intravenous injection of 10 mCi Tc-63m tetrofosmin. Subsequently, intravenous infusion of Lexiscan was performed under the supervision of the Cardiology staff. At peak effect of the drug, 30 mCi Tc-57m tetrofosmin was injected intravenously and standard myocardial SPECT imaging was performed. Quantitative gated imaging was also performed to evaluate left ventricular wall motion, and estimate left ventricular ejection fraction.  COMPARISON:  None. FINDINGS: Perfusion: No decreased activity in the left ventricle on stress imaging to suggest reversible ischemia or infarction. Wall Motion: Mild septal hypokinesis. No other wall motion abnormalities. Left Ventricular Ejection Fraction: 56 % End diastolic volume 72 ml End systolic volume 31 ml IMPRESSION: 1. No reversible ischemia or infarction. 2. Normal left ventricular wall motion. 3. Left ventricular ejection fraction 56% 4. Non invasive risk stratification*: Low *2012 Appropriate Use Criteria for Coronary Revascularization Focused Update: J Am Coll Cardiol. 5003;70(4):888-916. http://content.[email or web address].aspx?[email or web address] Electronically Signed   By: Aletta Edouard M.D.   On: 01/31/2016 15:39   Nm Pulmonary Perf And Vent  Result Date: 01/30/2016 CLINICAL DATA:  Dyspnea, chest pain, onset tonight. EXAM: NUCLEAR MEDICINE VENTILATION - PERFUSION LUNG SCAN TECHNIQUE: Ventilation images were obtained in multiple projections using inhaled aerosol Tc-4m DTPA. Perfusion images were obtained in multiple projections after intravenous injection of Tc-19m MAA. RADIOPHARMACEUTICALS:  32.4 mCi Technetium-21m DTPA aerosol inhalation  and 4.4 mCi Technetium-6m MAA IV COMPARISON:  Radiographs 01/29/2016 FINDINGS: Ventilation: No focal ventilation defect. Perfusion: No wedge shaped peripheral perfusion defects to suggest acute pulmonary embolism. No mismatched perfusion defects. IMPRESSION: No evidence of pulmonary embolism. Electronically Signed   By: Andreas Newport M.D.   On: 01/30/2016 00:38        Scheduled Meds: . amLODipine  7.5 mg Oral q morning - 10a  . apixaban  5 mg Oral BID  . atenolol  100 mg Oral Daily  . calcium carbonate  1 tablet Oral BID WC  . chlorthalidone  25 mg Oral Daily  . fluticasone  2 spray Each Nare Daily  . losartan  100 mg Oral Daily  . magnesium gluconate  500 mg Oral Daily  . mouth rinse  15 mL Mouth Rinse BID  . multivitamin with minerals  1 tablet Oral Daily  . pravastatin  40 mg Oral QPM  . regadenoson      . vitamin C  500 mg Oral Daily   Continuous Infusions:     LOS: 3 days    Time spent: 30 minutes     Dessa Phi, DO [facility] Pager [phone]  If 7PM-7AM, please contact night-coverage [email or web address] Password [facility] 01/31/2016, 4:54 PM

## 2016-01-31 NOTE — Discharge Instructions (Signed)

## 2016-01-31 NOTE — Progress Notes (Signed)
SATURATION QUALIFICATIONS: (This note is used to comply with regulatory documentation for home oxygen)  Patient Saturations on Room Air at Rest = 92%  Patient Saturations on Room Air while Ambulating = 80%  Patient Saturations on 1 Liters of oxygen while Ambulating = 89%  Patient Saturations on 2 Liters of oxygen while Ambulating = 91%  Please briefly explain why patient needs home oxygen: to maintain oxygen level >87%   01/31/2016 Barry Brunner, PT Pager: [phone]

## 2016-02-01 LAB — CBC WITH DIFFERENTIAL/PLATELET
BASOS ABS: 0 10*3/uL (ref 0.0–0.1)
Basophils Relative: 0 %
EOS PCT: 1 %
Eosinophils Absolute: 0.1 10*3/uL (ref 0.0–0.7)
HEMATOCRIT: 36.6 % (ref 36.0–46.0)
Hemoglobin: 11.5 g/dL — ABNORMAL LOW (ref 12.0–15.0)
LYMPHS ABS: 1.1 10*3/uL (ref 0.7–4.0)
LYMPHS PCT: 12 %
MCH: 29.6 pg (ref 26.0–34.0)
MCHC: 31.4 g/dL (ref 30.0–36.0)
MCV: 94.3 fL (ref 78.0–100.0)
MONO ABS: 1.2 10*3/uL — AB (ref 0.1–1.0)
Monocytes Relative: 14 %
NEUTROS ABS: 6.7 10*3/uL (ref 1.7–7.7)
Neutrophils Relative %: 73 %
PLATELETS: 144 10*3/uL — AB (ref 150–400)
RBC: 3.88 MIL/uL (ref 3.87–5.11)
RDW: 13.5 % (ref 11.5–15.5)
WBC: 9.1 10*3/uL (ref 4.0–10.5)

## 2016-02-01 LAB — BASIC METABOLIC PANEL
ANION GAP: 8 (ref 5–15)
BUN: 21 mg/dL — AB (ref 6–20)
CO2: 27 mmol/L (ref 22–32)
Calcium: 9.6 mg/dL (ref 8.9–10.3)
Chloride: 100 mmol/L — ABNORMAL LOW (ref 101–111)
Creatinine, Ser: 1.01 mg/dL — ABNORMAL HIGH (ref 0.44–1.00)
GFR calc Af Amer: 59 mL/min — ABNORMAL LOW (ref >60)
GFR calc non Af Amer: 51 mL/min — ABNORMAL LOW (ref >60)
GLUCOSE: 122 mg/dL — AB (ref 65–99)
POTASSIUM: 3.2 mmol/L — AB (ref 3.5–5.1)
Sodium: 135 mmol/L (ref 135–145)

## 2016-02-01 MED ORDER — APIXABAN 5 MG PO TABS
5.0000 mg | ORAL_TABLET | Freq: Two times a day (BID) | ORAL | 0 refills | Status: DC
Start: 2016-02-01 — End: 2016-02-12

## 2016-02-01 MED ORDER — AMLODIPINE BESYLATE 5 MG PO TABS: 10.0000 mg | ORAL_TABLET | Freq: Every morning | ORAL | 0 refills | Status: DC

## 2016-02-01 MED ORDER — AMLODIPINE BESYLATE 10 MG PO TABS
10.0000 mg | ORAL_TABLET | Freq: Every morning | ORAL | Status: DC
  Administered 2016-02-01: 10 mg via ORAL
  Filled 2016-02-01: qty 1

## 2016-02-01 NOTE — Discharge Summary (Signed)
Physician Discharge Summary  Valerie Terrell ZOX:[id number] DOB: 11-24-1932 DOA: 01/28/2016  PCP: Amador Cunas, FNP  Admit date: 01/28/2016 Discharge date: 02/01/2016  Admitted From: Home Disposition: Home  Recommendations for Outpatient Follow-up:  1. Follow up with PCP in 1-2 weeks 2. Follow up with Neurology in 6 weeks 3. Follow up with Cardiology  4. Follow up with ophthalmologist   Home Health: outpatient PT, outpatient OT  Equipment/Devices: No   Discharge Condition: Stable CODE STATUS: Full  Diet recommendation: Heart Healthy   Brief/Interim Summary: Valerie Terrell a 82 y.o.femalewith medical history significant forparoxysmal atrial fibrillation briefly on Xarelto (per patient, she took only 1 tablet) prior to traumatic subarachnoid hemorrhage in 2016, hypertension, and hyperlipidemia who presents to the emergency department at the direction of her ophthalmologist for further evaluation and management of retinal artery branch occlusion involving the left eye. Patient reports that she had been in her usual state of health until the night of 09/05/2017when she developed a severe left-sided retro-orbital headache. She began to develop blurred vision in the left eye and a MRI brain was performed on 01/24/2016 with no acute findings. With continued worsening in her symptoms, she was reevaluated by ophthalmology 9/12, diagnosed with left sided retinal artery branch occlusion, and treated in the clinic with laser therapy per the patient's report. She was then directed to the emergency department for further evaluation and management. Neurology was consulted. MRI revealed microembolic infarctions in right occipital lobe and left parietal vertex. Neurology discussed with her the benefit vs risk of re-starting anticoagulation. It is felt that a traumatic SAH is not a contraindication to anticoagulation; also she had only taken 1 tablet of Xarelto before she hit her head leaving to [facility]. Benefit of  stroke prevention vs risk of bleed was extensively discussed with patient multiple times and she elected to start Eliquis at discharge. During hospitalization, she also had significant chest pressure as well as shortness of breath. VQ scan was negative for PE. Cardiology was consulted and patient was taken to [place] stress test which was normal. Chest pain has not recurred. She will follow-up with cardiology outpatient. She is also to follow-up with neurology as outpatient. PT/OT evaluated patient and recommended home health PT and outpatient OT. She will be discharged with baby aspirin, Eliquis twice daily, Pravachol. Due to continued elevated BP, norvasc was increased to 10mg  daily.   Discharge Diagnoses:  Principal Problem:   Retinal artery branch occlusion of left eye Active Problems:   Atrial fibrillation (HCC)   Hypertension   Stroke [facility])   Personal history of subarachnoid hemorrhage   CKD (chronic kidney disease), stage II   Acute CVA (cerebrovascular accident) ([place])   Chest pain   HLD (hyperlipidemia)  Left branch retinal artery occlusion and micro embolic infarctions in right occipital lobe and left parietal vertex  - Diagnosed just PTA and per pt report, laser treatment was performed in the ophthalmology clinic just prior to arrival here  - Secondary to distal emboli (pt has PAF), local atheroma, small vessel disease; GCA less likely (sed rate 22) - Head CT: negative - MRI of the brain performed on 01/24/2016 with and without contrast at [facility] - Neurology following, appreciate recommendations   - Neuro checks  - Asa daily - reduce to 81mg  daily  - Continue statin  - PT, OT, SLP evals  - Echo: no cardiac source of emboli  - CTA head and neck with contrast: atherosclerosis of aorta  - MR brain: Scattered punctate acute infarctions consistent  with micro embolic infarctions from the heart or ascending aorta. These are evident in the right occipital lobe an the  left parietal vertex  - Spoke with neurology; will start Eliquis today as patient's symptoms are likely cardioembolic source. In the past, patient had taken just one tablet of Xarelto. She had a syncopal episode, hit her head, and suffered a subarachnoid hemorrhage. Patient, neurology, and I discussed with her the risks and benefits of starting on another anticoagulant to prevent further embolic events versus risk of bleeding. Patient has elected to start anticoagulation at this time.  Chest pain, now resolved  - VQ negative for PE  - Cardiology consulted - Lexiscan negative   Hypertension  - Managed with Norvasc, atenolol, cozaar and chlorthalidone at home - Initially managed to allow permissive hypertension - Will increase Norvasc to 10mg  at time of discharge; I did discuss with the patient importance of blood pressure control. She must follow-up with her primary care physician in the next week for recheck of her blood pressure and possibly change in antihypertensive regimen.  Paroxysmal atrial fibrillation  - In a junctional rhythm at time of admission  - CHADS-VASc at least 5 (age x2, gender, HTN, aortic plaque), 7 if current BRAO considered as CVA  - Followed by [facility] cardiology - Was on Xarelto (took 1 tablet) prior to suffering a traumatic SAH in June 2016  - Eliquis started   Headache  - Likely secondary to HTN  - Less likely giant cell arteritis with sed rate 22   AKI superimposed on CKD stage Ii, improving  - Baseline Cr of ~1  - Likely a prerenal azotemia in setting of recent poor oral intake  - Renal US negative - UA negative   Hx of SAH on Xarelto  - Occurred in June of 2016, after a fall, took 1 tablet of Xarelto only    Discharge Instructions  Discharge Instructions    Ambulatory referral to Neurology    Complete by:  As directed    Pt will follow up with Dr. Erlinda Hong at [facility] in about 6 weeks. Thanks.   Diet - low sodium heart healthy    Complete by:  As  directed    Increase activity slowly    Complete by:  As directed        Medication List    STOP taking these medications   doxycycline 100 MG capsule Commonly known as:  VIBRAMYCIN     TAKE these medications   amLODipine 5 MG tablet Commonly known as:  NORVASC Take 2 tablets (10 mg total) by mouth every morning. What changed:  how much to take   apixaban 5 MG Tabs tablet Commonly known as:  ELIQUIS Take 1 tablet (5 mg total) by mouth 2 (two) times daily.   aspirin 81 MG chewable tablet Chew 81 mg by mouth daily.   atenolol-chlorthalidone 100-25 MG tablet Commonly known as:  TENORETIC Take 1 tablet by mouth every morning.   calcium carbonate 600 MG Tabs tablet Commonly known as:  OS-CAL Take 600 mg by mouth 2 (two) times daily.   dicyclomine 10 MG capsule Commonly known as:  BENTYL Take 10 mg by mouth 4 (four) times daily as needed.   losartan 100 MG tablet Commonly known as:  COZAAR Take 100 mg by mouth daily.   Magnesium 27 500 (27 Mg) MG Tabs Take 500 mg by mouth daily.   multivitamin tablet Take 1 tablet by mouth daily.   pravastatin 40 MG tablet  Commonly known as:  PRAVACHOL Take 40 mg by mouth every evening.   vitamin C 500 MG tablet Commonly known as:  ASCORBIC ACID Take 500 mg by mouth daily.      Follow-up Information    Xu,Jindong, MD. Schedule an appointment as soon as possible for a visit in 6 week(s).   Specialty:  Neurology Contact information: [address] [place] [address] [phone]        Quay Burow, MD .   Specialties:  Cardiology, Radiology Why:  the office will call with date and time Contact information: [address] [place] [address] [phone]        Amador Cunas, [place]. Schedule an appointment as soon as possible for a visit in 1 week(s).   Specialty:  Family Medicine Contact information: [address] [place] [address] [phone]           Allergies  Allergen Reactions  . Sulfa Antibiotics Other (See Comments)    Reaction unknown  . Nickel Rash    Consultations:  Neurology, Dr. Nicole Kindred  Cardiology, Dr. Gwenlyn Found    Procedures/Studies: Ct Angio Head W Or Wo Contrast  Result Date: 01/29/2016 CLINICAL DATA:  Retinal branch artery occlusion on the left. Micro embolic infarctions on MRI. EXAM: CT ANGIOGRAPHY HEAD AND NECK TECHNIQUE: Multidetector CT imaging of the head and neck was performed using the standard protocol during bolus administration of intravenous contrast. Multiplanar CT image reconstructions and MIPs were obtained to evaluate the vascular anatomy. Carotid stenosis measurements (when applicable) are obtained utilizing NASCET criteria, using the distal internal carotid diameter as the denominator. CONTRAST:  50 cc Isovue 370 COMPARISON:  MRI same day FINDINGS: CT HEAD Generalized atrophy. Ordinary small vessel change of the cerebral hemispheric white matter. No sign of acute infarction, mass lesion, hemorrhage, hydrocephalus or extra-axial collection by CT. There is atherosclerotic calcification of the major vessels at the base of the brain. CTA NECK Aortic arch: Aortic atherosclerosis with extensive irregular plaque. Branching pattern of the brachiocephalic vessels from the arch is normal without flow limiting stenosis. Right carotid system: Common carotid artery widely patent to the bifurcation. Atherosclerotic disease of the carotid bifurcation with minimal diameter at the distal ICA bulb measuring 4 mm. Compared to a more distal cervical ICA diameter of 5 mm, this indicates a 20% stenosis. Left carotid system: Common carotid artery patent to the bifurcation. Atherosclerotic disease at the carotid bifurcation with minimal diameter of the proximal ICA of 5 mm. Compared to a more distal cervical ICA diameter of 5 mm, there is no stenosis. Vertebral arteries:Both vertebral artery origins are widely patent. The vertebral  arteries are approximately equal in size an widely patent through the neck. Skeleton: Ordinary spondylosis. Other neck: No mass or lymphadenopathy. Upper chest: Lung apices are clear. CTA HEAD Anterior circulation: Both internal carotid arteries are patent through the skullbase. There is atherosclerotic calcification in the carotid siphon regions but no stenosis greater than 30%. Both ophthalmic arteries show flow. The anterior and middle cerebral vessels are patent without proximal stenosis, aneurysm or vascular malformation. Posterior circulation: Both vertebral arteries are patent through the foramen magnum. There is some atherosclerotic calcification but no stenosis. Both vertebral arteries are patent to the basilar. No basilar stenosis. Posterior circulation branch vessels appear normal. Venous sinuses: Patent and normal. Anatomic variants: None significant Delayed phase: No abnormal enhancement IMPRESSION: Given the micro embolic infarctions in both cerebral hemispheres, the most significant finding in this case is that  of atherosclerosis of the aorta. There is irregular plaque, particularly notable in the descending aorta. The MRI pattern suggest micro embolic disease from the heart or ascending aorta. Atherosclerotic disease at both carotid bifurcations. 20% narrowing on the right and no narrowing on the left. Atherosclerotic disease in both carotid siphon regions but without stenosis greater than 30%. Electronically Signed   By: Nelson Chimes M.D.   On: 01/29/2016 18:07   Dg Chest 2 View  Result Date: 01/30/2016 CLINICAL DATA:  Pulmonary air embolism. EXAM: CHEST  2 VIEW COMPARISON:  Chest radiographs yesterday. FINDINGS: Lower lung volumes from prior exam accentuating the cardiac silhouette. There is also crowding of bronchovascular markings. Bronchial thickening is likely accentuated by lower lung volumes. Development of bibasilar opacities from prior. No pleural fluid. No pneumothorax. IMPRESSION:  Lower lung volumes from prior exam accentuating the cardiac size fat and bronchovascular markings. Bibasilar opacities, likely atelectasis, however aspiration or developing pneumonia could have a similar radiographic appearance. Electronically Signed   By: Jeb Levering M.D.   On: 01/30/2016 02:30   Ct Head Wo Contrast  Result Date: 01/28/2016 CLINICAL DATA:  Hypertension and ocular occlusion. Headache and dizziness and nausea. EXAM: CT HEAD WITHOUT CONTRAST TECHNIQUE: Contiguous axial images were obtained from the base of the skull through the vertex without intravenous contrast. COMPARISON:  None. FINDINGS: Brain: No evidence for acute brain infarct, intracranial hemorrhage or mass. Prominence of the sulci and ventricles are identified compatible with brain atrophy. Patchy areas of low attenuation throughout the subcortical and periventricular white matter are identified compatible with chronic microvascular disease. Vascular: No hyperdense vessel or unexpected calcification. Skull: The osseous skull is intact. Sinuses/Orbits: There is mild mucosal thickening involving the left maxillary sinus. Other: None. IMPRESSION: 1. No acute intracranial abnormalities. 2. Chronic microvascular disease and brain atrophy. Electronically Signed   By: Kerby Moors M.D.   On: 01/28/2016 16:54   Ct Angio Neck W Or Wo Contrast  Result Date: 01/29/2016 CLINICAL DATA:  Retinal branch artery occlusion on the left. Micro embolic infarctions on MRI. EXAM: CT ANGIOGRAPHY HEAD AND NECK TECHNIQUE: Multidetector CT imaging of the head and neck was performed using the standard protocol during bolus administration of intravenous contrast. Multiplanar CT image reconstructions and MIPs were obtained to evaluate the vascular anatomy. Carotid stenosis measurements (when applicable) are obtained utilizing NASCET criteria, using the distal internal carotid diameter as the denominator. CONTRAST:  50 cc Isovue 370 COMPARISON:  MRI same  day FINDINGS: CT HEAD Generalized atrophy. Ordinary small vessel change of the cerebral hemispheric white matter. No sign of acute infarction, mass lesion, hemorrhage, hydrocephalus or extra-axial collection by CT. There is atherosclerotic calcification of the major vessels at the base of the brain. CTA NECK Aortic arch: Aortic atherosclerosis with extensive irregular plaque. Branching pattern of the brachiocephalic vessels from the arch is normal without flow limiting stenosis. Right carotid system: Common carotid artery widely patent to the bifurcation. Atherosclerotic disease of the carotid bifurcation with minimal diameter at the distal ICA bulb measuring 4 mm. Compared to a more distal cervical ICA diameter of 5 mm, this indicates a 20% stenosis. Left carotid system: Common carotid artery patent to the bifurcation. Atherosclerotic disease at the carotid bifurcation with minimal diameter of the proximal ICA of 5 mm. Compared to a more distal cervical ICA diameter of 5 mm, there is no stenosis. Vertebral arteries:Both vertebral artery origins are widely patent. The vertebral arteries are approximately equal in size an widely patent through the neck. Skeleton:  Ordinary spondylosis. Other neck: No mass or lymphadenopathy. Upper chest: Lung apices are clear. CTA HEAD Anterior circulation: Both internal carotid arteries are patent through the skullbase. There is atherosclerotic calcification in the carotid siphon regions but no stenosis greater than 30%. Both ophthalmic arteries show flow. The anterior and middle cerebral vessels are patent without proximal stenosis, aneurysm or vascular malformation. Posterior circulation: Both vertebral arteries are patent through the foramen magnum. There is some atherosclerotic calcification but no stenosis. Both vertebral arteries are patent to the basilar. No basilar stenosis. Posterior circulation branch vessels appear normal. Venous sinuses: Patent and normal. Anatomic  variants: None significant Delayed phase: No abnormal enhancement IMPRESSION: Given the micro embolic infarctions in both cerebral hemispheres, the most significant finding in this case is that of atherosclerosis of the aorta. There is irregular plaque, particularly notable in the descending aorta. The MRI pattern suggest micro embolic disease from the heart or ascending aorta. Atherosclerotic disease at both carotid bifurcations. 20% narrowing on the right and no narrowing on the left. Atherosclerotic disease in both carotid siphon regions but without stenosis greater than 30%. Electronically Signed   By: Nelson Chimes M.D.   On: 01/29/2016 18:07   Mr Brain Wo Contrast  Result Date: 01/29/2016 CLINICAL DATA:  Headache. Left retinal artery branch occlusion. Blurred vision on the left. EXAM: MRI HEAD WITHOUT CONTRAST TECHNIQUE: Multiplanar, multiecho pulse sequences of the brain and surrounding structures were obtained without intravenous contrast. COMPARISON:  Head CT yesterday. FINDINGS: Brain: Diffusion imaging shows 2 punctate foci of acute infarction in the right occipital cortex an a punctate focus at the left parietal vertex, consistent with micro embolic infarctions from the heart or ascending aorta. No large vessel territory infarction. The brainstem and cerebellum are normal. Cerebral hemispheres otherwise show old small vessel infarctions affecting the deep and subcortical white matter in the right basal ganglia. No mass lesion, hemorrhage, hydrocephalus or extra-axial collection. Vascular: Major vessels at the base of the brain show flow. Skull and upper cervical spine: Negative Sinuses/Orbits: Sinuses clear.  No orbital abnormality seen by MR. Other: None IMPRESSION: Scattered punctate acute infarctions consistent with micro embolic infarctions from the heart or ascending aorta. These are evident in the right occipital lobe an the left parietal vertex. Electronically Signed   By: Nelson Chimes M.D.    On: 01/29/2016 17:13   US Renal  Result Date: 01/29/2016 CLINICAL DATA:  Acute kidney injury. EXAM: RENAL / URINARY TRACT ULTRASOUND COMPLETE COMPARISON:  None. FINDINGS: Right Kidney: Length: 11.7 cm. Echogenicity within normal limits. 1.3 cm interpolar cyst. No solid mass or hydronephrosis visualized. Left Kidney: Length: 8.3 cm. Small and suboptimally visualized. No gross mass or hydronephrosis. Bladder: Appears normal for degree of bladder distention. IMPRESSION: 1. Atrophic/hypoplastic left kidney. 2. Small right renal cyst. 3. No hydronephrosis. Electronically Signed   By: Logan Bores M.D.   On: 01/29/2016 23:26   Nm Myocar Multi W/spect W/wall Motion / Ef  Result Date: 01/31/2016 CLINICAL DATA:  Chest pain, atrial fibrillation, hypertension and elevated troponin. EXAM: MYOCARDIAL IMAGING WITH SPECT (REST AND PHARMACOLOGIC-STRESS) GATED LEFT VENTRICULAR WALL MOTION STUDY LEFT VENTRICULAR EJECTION FRACTION TECHNIQUE: Standard myocardial SPECT imaging was performed after resting intravenous injection of 10 mCi Tc-40m tetrofosmin. Subsequently, intravenous infusion of Lexiscan was performed under the supervision of the Cardiology staff. At peak effect of the drug, 30 mCi Tc-88m tetrofosmin was injected intravenously and standard myocardial SPECT imaging was performed. Quantitative gated imaging was also performed to evaluate left ventricular wall motion,  and estimate left ventricular ejection fraction. COMPARISON:  None. FINDINGS: Perfusion: No decreased activity in the left ventricle on stress imaging to suggest reversible ischemia or infarction. Wall Motion: Mild septal hypokinesis. No other wall motion abnormalities. Left Ventricular Ejection Fraction: 56 % End diastolic volume 72 ml End systolic volume 31 ml IMPRESSION: 1. No reversible ischemia or infarction. 2. Normal left ventricular wall motion. 3. Left ventricular ejection fraction 56% 4. Non invasive risk stratification*: Low *2012 Appropriate  Use Criteria for Coronary Revascularization Focused Update: J Am Coll Cardiol. 1448;18(5):631-497. http://content.airportbarriers.com.aspx?articleid=[email or web address] Electronically Signed   By: Aletta Edouard M.D.   On: 01/31/2016 15:39   Nm Pulmonary Perf And Vent  Result Date: 01/30/2016 CLINICAL DATA:  Dyspnea, chest pain, onset tonight. EXAM: NUCLEAR MEDICINE VENTILATION - PERFUSION LUNG SCAN TECHNIQUE: Ventilation images were obtained in multiple projections using inhaled aerosol Tc-35m DTPA. Perfusion images were obtained in multiple projections after intravenous injection of Tc-5m MAA. RADIOPHARMACEUTICALS:  32.4 mCi Technetium-20m DTPA aerosol inhalation and 4.4 mCi Technetium-53m MAA IV COMPARISON:  Radiographs 01/29/2016 FINDINGS: Ventilation: No focal ventilation defect. Perfusion: No wedge shaped peripheral perfusion defects to suggest acute pulmonary embolism. No mismatched perfusion defects. IMPRESSION: No evidence of pulmonary embolism. Electronically Signed   By: Andreas Newport M.D.   On: 01/30/2016 00:38   Dg Chest Port 1 View  Result Date: 01/28/2016 CLINICAL DATA:  Shortness of breath today. EXAM: PORTABLE CHEST 1 VIEW COMPARISON:  None. FINDINGS: Heart at the upper limits normal in size. There is atherosclerosis of the thoracic aorta. Mild interstitial prominence with a chronic appearance, however no prior exams for comparison. No pulmonary edema. Mild bibasilar atelectasis, right greater than left. No evidence pleural effusion. No pneumothorax. No osseous abnormality is seen. IMPRESSION: Borderline cardiomegaly with atherosclerosis of the thoracic aorta. Mild bibasilar atelectasis. Electronically Signed   By: Jeb Levering M.D.   On: 01/28/2016 19:16  Echo Study Conclusions  - Left ventricle: The cavity size was normal. Wall thickness was   normal. Systolic function was normal. The estimated ejection   fraction was in the range of 55% to 60%. Wall motion was normal;   there  were no regional wall motion abnormalities. Doppler   parameters are consistent with abnormal left ventricular   relaxation (grade 1 diastolic dysfunction). - Mitral valve: Calcified annulus.  Impressions: - No cardiac source of emboli was indentified.  Subjective: Patient doing well this morning. She has no recurrence of vision changes or chest pain.  Discharge Exam: Vitals:   02/01/16 0307 02/01/16 0521  BP: (!) 185/66 (!) 177/70  Pulse:  73  Resp:  20  Temp:  98.8 F (37.1 C)   General exam: Appears calm and comfortable  Respiratory system: Clear to auscultation. Respiratory effort normal. Cardiovascular system: S1 & S2 heard, RRR. No JVD, murmurs, rubs, gallops or clicks. No pedal edema. Gastrointestinal system: Abdomen is nondistended, soft and nontender. No organomegaly or masses felt. Normal bowel sounds heard. Central nervous system: Alert and oriented. No gross CN deficit. Extremities: Symmetric 5 x 5 power. Skin: No rashes, lesions or ulcers Psychiatry: Judgement and insight appear normal. Mood & affect appropriate.    The results of significant diagnostics from this hospitalization (including imaging, microbiology, ancillary and laboratory) are listed below for reference.     Microbiology: Recent Results (from the past 240 hour(s))  MRSA PCR Screening     Status: None   Collection Time: 01/29/16  8:13 PM  Result Value Ref Range Status   MRSA by PCR  NEGATIVE NEGATIVE Final    Comment:        The GeneXpert MRSA Assay (FDA approved for NASAL specimens only), is one component of a comprehensive MRSA colonization surveillance program. It is not intended to diagnose MRSA infection nor to guide or monitor treatment for MRSA infections.      Labs: BNP (last 3 results) No results for input(s): BNP in the last 8760 hours. Basic Metabolic Panel:  Recent Labs Lab 01/28/16 1629 01/28/16 1653 01/30/16 0356 01/31/16 0333 02/01/16 0219  NA 142 141 136 137  135  K 3.7 3.7 3.9 3.6 3.2*  CL 103 102 103 105 100*  CO2 29  --  25 26 27   GLUCOSE 99 98 102* 99 122*  BUN 33* 35* 29* 26* 21*  CREATININE 1.32* 1.40* 1.23* 1.09* 1.01*  CALCIUM 10.1  --  9.3 9.3 9.6   Liver Function Tests:  Recent Labs Lab 01/28/16 1629  AST 23  ALT 15  ALKPHOS 43  BILITOT 0.7  PROT 7.3  ALBUMIN 4.1   No results for input(s): LIPASE, AMYLASE in the last 168 hours. No results for input(s): AMMONIA in the last 168 hours. CBC:  Recent Labs Lab 01/28/16 1629 01/28/16 1653 01/30/16 0356 01/31/16 0333 02/01/16 0219  WBC 7.4  --  9.2 8.6 9.1  NEUTROABS 4.5  --  7.2 6.3 6.7  HGB 14.0 14.3 12.4 11.2* 11.5*  HCT 43.0 42.0 38.5 35.9* 36.6  MCV 94.7  --  94.4 95.7 94.3  PLT 189  --  147* 129* 144*   Cardiac Enzymes:  Recent Labs Lab 01/30/16 0027 01/30/16 0356 01/30/16 0711  TROPONINI 0.08* 0.07* 0.07*   BNP: Invalid input(s): POCBNP CBG: No results for input(s): GLUCAP in the last 168 hours. D-Dimer No results for input(s): DDIMER in the last 72 hours. Hgb A1c No results for input(s): HGBA1C in the last 72 hours. Lipid Profile No results for input(s): CHOL, HDL, LDLCALC, TRIG, CHOLHDL, LDLDIRECT in the last 72 hours. Thyroid function studies No results for input(s): TSH, T4TOTAL, T3FREE, THYROIDAB in the last 72 hours.  Invalid input(s): FREET3 Anemia work up No results for input(s): VITAMINB12, FOLATE, FERRITIN, TIBC, IRON, RETICCTPCT in the last 72 hours. Urinalysis    Component Value Date/Time   COLORURINE YELLOW 01/29/2016 0123   APPEARANCEUR CLEAR 01/29/2016 0123   LABSPEC >1.046 (H) 01/29/2016 0123   PHURINE 6.0 01/29/2016 0123   GLUCOSEU NEGATIVE 01/29/2016 0123   HGBUR NEGATIVE 01/29/2016 0123   BILIRUBINUR NEGATIVE 01/29/2016 0123   KETONESUR NEGATIVE 01/29/2016 0123   PROTEINUR NEGATIVE 01/29/2016 0123   NITRITE NEGATIVE 01/29/2016 0123   LEUKOCYTESUR TRACE (A) 01/29/2016 0123   Sepsis Labs Invalid input(s):  PROCALCITONIN,  WBC,  LACTICIDVEN Microbiology Recent Results (from the past 240 hour(s))  MRSA PCR Screening     Status: None   Collection Time: 01/29/16  8:13 PM  Result Value Ref Range Status   MRSA by PCR NEGATIVE NEGATIVE Final    Comment:        The GeneXpert MRSA Assay (FDA approved for NASAL specimens only), is one component of a comprehensive MRSA colonization surveillance program. It is not intended to diagnose MRSA infection nor to guide or monitor treatment for MRSA infections.      Time coordinating discharge: Over 30 minutes  SIGNED:  Dessa Phi, DO [facility] Pager [phone]  If 7PM-7AM, please contact night-coverage [email or web address] Password [email or web address] 02/01/2016, 9:09 AM

## 2016-02-02 ENCOUNTER — Emergency Department (HOSPITAL_COMMUNITY): Payer: Medicare Other

## 2016-02-02 ENCOUNTER — Encounter (HOSPITAL_COMMUNITY): Payer: Self-pay

## 2016-02-02 ENCOUNTER — Emergency Department (HOSPITAL_COMMUNITY)
Admission: EM | Admit: 2016-02-02 | Discharge: 2016-02-02 | Disposition: A | Discharge status: Home / Self Care | Payer: Medicare Other | Attending: Emergency Medicine | Admitting: Emergency Medicine

## 2016-02-02 DIAGNOSIS — Z8673 Personal history of transient ischemic attack (TIA), and cerebral infarction without residual deficits: Secondary | ICD-10-CM | POA: Insufficient documentation

## 2016-02-02 DIAGNOSIS — Z7982 Long term (current) use of aspirin: Secondary | ICD-10-CM | POA: Insufficient documentation

## 2016-02-02 DIAGNOSIS — I129 Hypertensive chronic kidney disease with stage 1 through stage 4 chronic kidney disease, or unspecified chronic kidney disease: Secondary | ICD-10-CM | POA: Diagnosis not present

## 2016-02-02 DIAGNOSIS — I6310 Cerebral infarction due to embolism of unspecified precerebral artery: Secondary | ICD-10-CM

## 2016-02-02 DIAGNOSIS — I6340 Cerebral infarction due to embolism of unspecified cerebral artery: Secondary | ICD-10-CM | POA: Diagnosis not present

## 2016-02-02 DIAGNOSIS — H578 Other specified disorders of eye and adnexa: Secondary | ICD-10-CM | POA: Diagnosis present

## 2016-02-02 DIAGNOSIS — N182 Chronic kidney disease, stage 2 (mild): Secondary | ICD-10-CM | POA: Diagnosis not present

## 2016-02-02 DIAGNOSIS — Z7901 Long term (current) use of anticoagulants: Secondary | ICD-10-CM | POA: Insufficient documentation

## 2016-02-02 LAB — I-STAT TROPONIN, ED: Troponin i, poc: 0.06 ng/mL (ref 0.00–0.08)

## 2016-02-02 LAB — I-STAT CHEM 8, ED
BUN: 23 mg/dL — AB (ref 6–20)
CALCIUM ION: 1.23 mmol/L (ref 1.15–1.40)
CHLORIDE: 94 mmol/L — AB (ref 101–111)
Creatinine, Ser: 1.1 mg/dL — ABNORMAL HIGH (ref 0.44–1.00)
GLUCOSE: 113 mg/dL — AB (ref 65–99)
HCT: 38 % (ref 36.0–46.0)
Hemoglobin: 12.9 g/dL (ref 12.0–15.0)
POTASSIUM: 3.3 mmol/L — AB (ref 3.5–5.1)
Sodium: 135 mmol/L (ref 135–145)
TCO2: 29 mmol/L (ref 0–100)

## 2016-02-02 LAB — COMPREHENSIVE METABOLIC PANEL
ALK PHOS: 39 U/L (ref 38–126)
ALT: 28 U/L (ref 14–54)
AST: 42 U/L — ABNORMAL HIGH (ref 15–41)
Albumin: 3.6 g/dL (ref 3.5–5.0)
Anion gap: 10 (ref 5–15)
BILIRUBIN TOTAL: 0.8 mg/dL (ref 0.3–1.2)
BUN: 21 mg/dL — ABNORMAL HIGH (ref 6–20)
CALCIUM: 9.6 mg/dL (ref 8.9–10.3)
CHLORIDE: 95 mmol/L — AB (ref 101–111)
CO2: 29 mmol/L (ref 22–32)
CREATININE: 1.15 mg/dL — AB (ref 0.44–1.00)
GFR, EST AFRICAN AMERICAN: 51 mL/min — AB (ref >60)
GFR, EST NON AFRICAN AMERICAN: 44 mL/min — AB (ref >60)
Glucose, Bld: 118 mg/dL — ABNORMAL HIGH (ref 65–99)
Potassium: 3.3 mmol/L — ABNORMAL LOW (ref 3.5–5.1)
Sodium: 134 mmol/L — ABNORMAL LOW (ref 135–145)
Total Protein: 6.8 g/dL (ref 6.5–8.1)

## 2016-02-02 LAB — PROTIME-INR
INR: 1.27
Prothrombin Time: 15.9 seconds — ABNORMAL HIGH (ref 11.4–15.2)

## 2016-02-02 LAB — DIFFERENTIAL
BASOS PCT: 0 %
Basophils Absolute: 0 10*3/uL (ref 0.0–0.1)
Eosinophils Absolute: 0.1 10*3/uL (ref 0.0–0.7)
Eosinophils Relative: 2 %
LYMPHS ABS: 1 10*3/uL (ref 0.7–4.0)
Lymphocytes Relative: 18 %
MONO ABS: 0.9 10*3/uL (ref 0.1–1.0)
MONOS PCT: 17 %
NEUTROS ABS: 3.4 10*3/uL (ref 1.7–7.7)
Neutrophils Relative %: 63 %

## 2016-02-02 LAB — CBC
HEMATOCRIT: 37 % (ref 36.0–46.0)
Hemoglobin: 12 g/dL (ref 12.0–15.0)
MCH: 30.2 pg (ref 26.0–34.0)
MCHC: 32.4 g/dL (ref 30.0–36.0)
MCV: 93.2 fL (ref 78.0–100.0)
Platelets: 173 10*3/uL (ref 150–400)
RBC: 3.97 MIL/uL (ref 3.87–5.11)
RDW: 13.1 % (ref 11.5–15.5)
WBC: 5.4 10*3/uL (ref 4.0–10.5)

## 2016-02-02 LAB — APTT: aPTT: 45 seconds — ABNORMAL HIGH (ref 24–36)

## 2016-02-02 MED ORDER — PROPARACAINE HCL 0.5 % OP SOLN
1.0000 [drp] | Freq: Once | OPHTHALMIC | Status: AC
  Administered 2016-02-02: 1 [drp] via OPHTHALMIC
  Filled 2016-02-02: qty 15

## 2016-02-02 NOTE — ED Notes (Signed)
Pt now reporting left-sided CP while resting in reclined position in stretcher. Will notify EDP. Pt remains on cardiac monitor, BP cuff, and pulse ox at this time.

## 2016-02-02 NOTE — ED Provider Notes (Signed)
[facility] DEPT Provider Note   CSN: [id number] Arrival date & time: 02/02/16  3220     History   Chief Complaint Chief Complaint  Patient presents with  . Blurred Vision    HPI Valerie Terrell is a 82 y.o. female.  HPI 82 year old female with past medical history of hypertension, hyperlipidemia, atrial fibrillation, and recent multiple embolic CVAs who presents with left visual field defect. The patient states that she noticed blurriness and the top visual field of her left eye. She thinks that it may have been there yesterday, but she noticed it worse when she woke up this morning. This persisted since then. Otherwise, she denies any associated nausea or vomiting. No recent new numbness or weakness. Of note, the patient was recently hospitalized for multiple embolic strokes. She was started on anticoagulation at this time and underwent extensive medical workup including negative stress test. Of note, while sitting in the hospital bed prior to my assessment, the patient did report transient chest pain that was positional and resolved when she sat up. No shortness of breath. She did have a negative stress test during her recent admission.  Past Medical History:  Diagnosis Date  . Atrial fibrillation ([place])   . Hyperlipidemia   . Hypertension   . Small kidney    left  . Stroke [facility])     Patient Active Problem List   Diagnosis Date Noted  . Chest pain   . HLD (hyperlipidemia)   . Acute CVA (cerebrovascular accident) ([place]) 01/29/2016  . Atrial fibrillation ([place]) 01/28/2016  . Hypertension 01/28/2016  . Stroke ([place]) 01/28/2016  . 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 Personal history of subarachnoid hemorrhage 01/28/2016  . CKD (chronic kidney disease), stage II 01/28/2016    Past Surgical History:  Procedure Laterality Date  . CESAREAN SECTION    . TONSILLECTOMY      OB History    No data available       Home Medications    Prior to Admission  medications   Medication Sig Start Date End Date Taking? Authorizing Provider  amLODipine (NORVASC) 5 MG tablet Take 2 tablets (10 mg total) by mouth every morning. 02/01/16  Yes Jennifer Chahn-Yang Choi, DO  apixaban (ELIQUIS) 5 MG TABS tablet Take 1 tablet (5 mg total) by mouth 2 (two) times daily. 02/01/16  Yes Jennifer Chahn-Yang Choi, DO  aspirin 81 MG chewable tablet Chew 81 mg by mouth daily.   Yes Historical Provider, MD  atenolol-chlorthalidone (TENORETIC) 100-25 MG tablet Take 1 tablet by mouth every morning. 01/27/16  Yes Historical Provider, MD  calcium carbonate (OS-CAL) 600 MG TABS tablet Take 600 mg by mouth 2 (two) times daily.   Yes Historical Provider, MD  dicyclomine (BENTYL) 10 MG capsule Take 10 mg by mouth 4 (four) times daily as needed. 01/27/16 02/10/16 Yes Historical Provider, MD  losartan (COZAAR) 100 MG tablet Take 100 mg by mouth daily. 01/27/16  Yes Historical Provider, MD  Magnesium Gluconate (MAGNESIUM 27) 500 (27 Mg) MG TABS Take 500 mg by mouth daily.   Yes Historical Provider, MD  Multiple Vitamin (MULTIVITAMIN) tablet Take 1 tablet by mouth daily.   Yes Historical Provider, MD  pravastatin (PRAVACHOL) 40 MG tablet Take 40 mg by mouth every evening. 07/16/15  Yes Historical Provider, MD  vitamin C (ASCORBIC ACID) 500 MG tablet Take 500 mg by mouth daily.   Yes Historical Provider, MD    Family History Family History  Problem Relation Age of  Onset  . Sudden Cardiac Death Neg Hx     Social History Social History  Substance Use Topics  . Smoking status: Never Smoker  . Smokeless tobacco: Never Used  . Alcohol use Yes     Comment: wine     Allergies   Sulfa antibiotics and Nickel   Review of Systems Review of Systems  Constitutional: Positive for fatigue. Negative for chills and fever.  HENT: Negative for congestion, rhinorrhea and sore throat.   Eyes: Positive for visual disturbance. Negative for pain and redness.  Respiratory: Negative for cough,  shortness of breath and wheezing.   Cardiovascular: Negative for chest pain and leg swelling.  Gastrointestinal: Negative for abdominal pain, diarrhea, nausea and vomiting.  Genitourinary: Negative for dysuria, flank pain, vaginal bleeding and vaginal discharge.  Musculoskeletal: Negative for neck pain.  Skin: Negative for rash.  Allergic/Immunologic: Negative for immunocompromised state.  Neurological: Positive for headaches. Negative for syncope.  Hematological: Does not bruise/bleed easily.  All other systems reviewed and are negative.    Physical Exam Updated Vital Signs BP 137/66   Pulse 63   Temp 98.5 F (36.9 C)   Resp 16   Ht 5' (1.524 m)   Wt 155 lb (70.3 kg)   SpO2 91%   BMI 30.27 kg/m   Physical Exam  Constitutional: She is oriented to person, place, and time. She appears well-developed and well-nourished. No distress.  HENT:  Head: Normocephalic and atraumatic.  Eyes: Conjunctivae are normal. Pupils are equal, round, and reactive to light.  IOP 17 OD, 16 OS. PERRL. No conjunctival injection or chemosis.  Neck: Neck supple.  Cardiovascular: Normal rate, regular rhythm and normal heart sounds.  Exam reveals no friction rub.   No murmur heard. Pulmonary/Chest: Effort normal and breath sounds normal. No respiratory distress. She has no wheezes. She has no rales.  Abdominal: She exhibits no distension.  Musculoskeletal: She exhibits no edema.  Neurological: She is alert and oriented to person, place, and time. She exhibits normal muscle tone.  Skin: Skin is warm. Capillary refill takes less than 2 seconds. No rash noted.  Psychiatric: She has a normal mood and affect.  Nursing note and vitals reviewed.   Neurological Exam:  Mental Status: Alert and oriented to person, place, and time. Attention and concentration normal. Speech clear. Recent memory is intact. Cranial Nerves: Visual fields intact but subjectively "blurry" in left upper and outer quadrants.[name] EOMI and  PERRLA. No nystagmus noted. Facial sensation intact at forehead, maxillary cheek, and chin/mandible bilaterally. No weakness of masticatory muscles. No facial asymmetry or weakness. Hearing grossly normal to finer rub. Uvula is midline, and palate elevates symmetrically. Normal SCM and trapezius strength. Tongue midline without fasciculations Motor: Muscle strength 5/5 in proximal and distal UE and LE bilaterally. No pronator drift. Muscle tone normal. Reflexes: 2+ and symmetrical in all four extremities.  Sensation: Intact to light touch in upper and lower extremities distally bilaterally.  Gait: Normal without ataxia. Coordination: Normal FTN bilaterally.     ED Treatments / Results  Labs (all labs ordered are listed, but only abnormal results are displayed) Labs Reviewed  PROTIME-INR - Abnormal; Notable for the following:       Result Value   Prothrombin Time 15.9 (*)    All other components within normal limits  APTT - Abnormal; Notable for the following:    aPTT 45 (*)    All other components within normal limits  COMPREHENSIVE METABOLIC PANEL - Abnormal; Notable for the following:  Sodium 134 (*)    Potassium 3.3 (*)    Chloride 95 (*)    Glucose, Bld 118 (*)    BUN 21 (*)    Creatinine, Ser 1.15 (*)    AST 42 (*)    GFR calc non Af Amer 44 (*)    GFR calc Af Amer 51 (*)    All other components within normal limits  I-STAT CHEM 8, ED - Abnormal; Notable for the following:    Potassium 3.3 (*)    Chloride 94 (*)    BUN 23 (*)    Creatinine, Ser 1.10 (*)    Glucose, Bld 113 (*)    All other components within normal limits  CBC  DIFFERENTIAL  I-STAT TROPOININ, ED  CBG MONITORING, ED    EKG  EKG Interpretation  Date/Time:  Sunday February 02 2016 09:29:09 EDT Ventricular Rate:  65 PR Interval:  164 QRS Duration: 84 QT Interval:  396 QTC Calculation: 411 R Axis:   58 Text Interpretation:  Normal sinus rhythm Normal ECG No acute changes No significant change  since last tracing Confirmed by Izel Hochberg MD, Lysbeth Galas [phone]) on 02/02/2016 11:32:29 AM       Radiology Mr Brain Wo Contrast  Result Date: 02/02/2016 CLINICAL DATA:  New visual changes. Recent embolic infarcts. Recent discharge from hospital. Dizziness. Initial encounter. EXAM: MRI HEAD WITHOUT CONTRAST TECHNIQUE: Multiplanar, multiecho pulse sequences of the brain and surrounding structures were obtained without intravenous contrast. COMPARISON:  MRI brain 01/29/2016. FINDINGS: Brain: Normalization or near normalization of previous areas of restricted diffusion noted on the scan from 09/13. A suspected new area of 2 mm restricted diffusion, RIGHT parietal parasagittal cortex, image 42 series 3 is noted. An area of RIGHT parietal subcortical white matter restricted diffusion is possible, but seen only on axial image 32. No hemorrhage, mass lesion, hydrocephalus, or extra-axial fluid. Advanced atrophy. Chronic microvascular ischemic change. Vascular: Normal flow voids. Skull and upper cervical spine: Normal marrow signal. Sinuses/Orbits: Negative.  BILATERAL cataract extraction. Other: None. IMPRESSION: Normalization or near normalization of previous punctate areas of acute infarction, shown on MR from 01/29/2016, with suspected new areas of RIGHT parietal cortical infarction, and RIGHT parietal subcortical white matter infarction, suggesting continued shower of emboli. See comments above. No hemorrhagic transformation, or other interval change. Stable atrophy and small vessel disease. Electronically Signed   By: Staci Righter M.D.   On: 02/02/2016 12:21    Procedures Procedures (including critical care time)  Medications Ordered in ED Medications  proparacaine (ALCAINE) 0.5 % ophthalmic solution 1 drop (1 drop Both Eyes Given 02/02/16 1420)     Initial Impression / Assessment and Plan / ED Course  I have reviewed the triage vital signs and the nursing notes.  Pertinent labs & imaging results that  were available during my care of the patient were reviewed by me and considered in my medical decision making (see chart for details).  Clinical Course   82 year old female with past medical history of recent multiple embolic stroke secondary to A. fib with clot burden who presents with left visual field deficit. See history of present illness above. On arrival, vital signs are otherwise stable and within normal limits. On my examination, intraocular pressures are normal and visual acuity is at baseline, although she does have left upper visual field deficits. Primary concern is recurrent embolic CVA. I discussed the case with neurologist, Dr. Tobias Alexander . He recommends MRI. However, likely no intervention, as patient is oriented medically optimized and  given absence of other visual or neurological deficits, TPA is not indicated, as well as no known last normal. Otherwise, I also discussed my concerns with ophthalmologist on call. Per his report, there is no intervention that is emergent for possible ocular stroke and patient's follow-up with Dr. Zigmund Daniel in 2 weeks is reasonable.  Screening lab work is unremarkable. Regarding her chest pain, her EKG is negative and troponin is negative. She had completely normal stress test just 2 days ago and I do not suspect ACS. Her story was highly consistent with positional chest pain while she was sitting on the stretcher. Otherwise, MRI does show multiple embolic strokes. Neurology will see in the emergency department.  Neurology has evaluated in the emergency department. At this time, given that these are tiny, likely minor embolic strokes and given just recent initiation of blood thinners, they do not feel this represented a failure of her Eliquis. They recommend discharge home. Given patient's otherwise stable vitals, reassuring lab work, and already medical optimization, believe this is reasonable. I discussed strict return precautions with the family at bedside  and will discharge him  Final Clinical Impressions(s) / ED Diagnoses   Final diagnoses:  Cerebrovascular accident (CVA) due to embolism of precerebral artery [facility])    New Prescriptions Discharge Medication List as of 02/02/2016  3:05 PM       Duffy Bruce, MD 02/02/16 2105

## 2016-02-02 NOTE — ED Triage Notes (Signed)
Per Pt, Pt is coming from home with complaints of vision changes to the left eye that was noticed upon waking. Pt was recently discharged after TIAs, and was told to return if symptoms returned. Pt denies numbness or tingling, HA, or confusion. Reports one episode of dizziness that subsided.

## 2016-02-02 NOTE — Consult Note (Signed)
Neurology Consultation Reason for Consult: Strokes Referring Physician: Ellender Hose, C  CC: Vision change  History is obtained from: Patient  HPI: Valerie Terrell is a 82 y.o. female who was recently admitted with a retinal branch artery occlusion and multifocal microemboli on MRI. She was started on Eliquis 2 days ago and went home yesterday. At home, she noticed that her vision seemed slightly blurrier than it had. She came in today for this concern. In the emergency department a repeat MRI was obtained which shows two new tiny infarcts.   LKW: Unclear tpa given?: no, anticoagulation    ROS: A 14 point ROS was performed and is negative except as noted in the HPI.  Past Medical History:  Diagnosis Date  . Atrial fibrillation ([place])   . Hyperlipidemia   . Hypertension   . Small kidney    left  . Stroke [facility])      Family History  Problem Relation Age of Onset  . Sudden Cardiac Death Neg Hx      Social History:  reports that she has never smoked. She has never used smokeless tobacco. She reports that she drinks alcohol. She reports that she does not use drugs.   Exam: Current vital signs: BP 173/71   Pulse 63   Temp 98.5 F (36.9 C)   Resp 16   Ht 5' (1.524 m)   Wt 70.3 kg (155 lb)   SpO2 91%   BMI 30.27 kg/m  Vital signs in last 24 hours: Temp:  [98.5 F (36.9 C)-98.6 F (37 C)] 98.5 F (36.9 C) (09/17 1231) Pulse Rate:  [61-65] 63 (09/17 1245) Resp:  [14-18] 16 (09/17 1245) BP: (162-187)/(62-71) 173/71 (09/17 1245) SpO2:  [91 %-100 %] 91 % (09/17 1245) Weight:  [70.3 kg (155 lb)] 70.3 kg (155 lb) (09/17 0933)   Physical Exam  Constitutional: Appears well-developed and well-nourished.  Psych: Affect appropriate to situation Eyes: No scleral injection HENT: No OP obstrucion Head: Normocephalic.  Cardiovascular: Normal rate and regular rhythm.  Respiratory: Effort normal and breath sounds normal to anterior ascultation GI: Soft.  No distension. There is no  tenderness.  Skin: WDI  Fundus exam is limited, but I don't see clear hemorrhage in the left eye, but as I said this is limited.  Neuro: Mental Status: Patient is awake, alert, oriented to person, place, month, year, and situation. Patient is able to give a clear and coherent history. No signs of aphasia or neglect Cranial Nerves: II: Visual Fields are full. Pupils are equal, round, and reactive to light.   III,IV, VI: EOMI without ptosis or diploplia.  V: Facial sensation is symmetric to temperature VII: Facial movement is symmetric.  VIII: hearing is intact to voice X: Uvula elevates symmetrically XI: Shoulder shrug is symmetric. XII: tongue is midline without atrophy or fasciculations.  Motor: Tone is normal. Bulk is normal. 5/5 strength was present in all four extremities.  Sensory: Sensation is symmetric to light touch and temperature in the arms and legs. Cerebellar: FNF intact bilaterally     I have reviewed labs in epic and the results pertinent to this consultation are: Borderline sodium, potassium, glucose, creatinine, none of which are markedly abnormal  I have reviewed the images obtained: MRI brain-2 small punctate infarcts  Impression: 82 year old female with increased blurriness in her left eye. Possibilities include waxing and waning of underlying symptoms, new emboli,  Intraocular hemorrhage. I do think that ophthalmology examination would be useful, but don't think that this necessarily needs to  be emergent. If she were to develop further worsening of her vision or eye pain then this may need to be further evaluated more urgently.  Though there is some small risk of hemorrhagic conversion with recent infarcts, both these are so small that I think the risk of continued emboli is higher than the risk of hemorrhagic conversion and therefore would continue Eliquis.  Recommendations: 1) continue Eliquis 2) agree with outpatient ophthalmology examination 3) no  further recommendations at this time, please call with further questions or concerns.   Roland Rack, MD [facility] [phone]  If 7pm- 7am, please page neurology on call as listed in [place].

## 2016-02-11 ENCOUNTER — Encounter (INDEPENDENT_AMBULATORY_CARE_PROVIDER_SITE_OTHER): Payer: Medicare Other | Admitting: Ophthalmology

## 2016-02-11 DIAGNOSIS — I10 Essential (primary) hypertension: Secondary | ICD-10-CM

## 2016-02-11 DIAGNOSIS — H43813 Vitreous degeneration, bilateral: Secondary | ICD-10-CM | POA: Diagnosis not present

## 2016-02-11 DIAGNOSIS — H35033 Hypertensive retinopathy, bilateral: Secondary | ICD-10-CM

## 2016-02-11 DIAGNOSIS — H34232 Retinal artery branch occlusion, left eye: Secondary | ICD-10-CM

## 2016-02-12 ENCOUNTER — Encounter: Payer: Self-pay | Admitting: Cardiology

## 2016-02-12 ENCOUNTER — Telehealth: Payer: Self-pay | Admitting: Cardiovascular Disease

## 2016-02-12 ENCOUNTER — Ambulatory Visit (INDEPENDENT_AMBULATORY_CARE_PROVIDER_SITE_OTHER): Payer: Medicare Other | Admitting: Cardiology

## 2016-02-12 DIAGNOSIS — I639 Cerebral infarction, unspecified: Secondary | ICD-10-CM | POA: Diagnosis not present

## 2016-02-12 DIAGNOSIS — E785 Hyperlipidemia, unspecified: Secondary | ICD-10-CM

## 2016-02-12 DIAGNOSIS — I10 Essential (primary) hypertension: Secondary | ICD-10-CM

## 2016-02-12 DIAGNOSIS — I480 Paroxysmal atrial fibrillation: Secondary | ICD-10-CM | POA: Diagnosis not present

## 2016-02-12 MED ORDER — HYDRALAZINE HCL 25 MG PO TABS: ORAL_TABLET | ORAL | 6 refills | Status: DC

## 2016-02-12 MED ORDER — APIXABAN 5 MG PO TABS: 5.0000 mg | ORAL_TABLET | Freq: Two times a day (BID) | ORAL | 3 refills | Status: DC

## 2016-02-12 NOTE — Assessment & Plan Note (Signed)
CHADs VASc=5 

## 2016-02-12 NOTE — Patient Instructions (Addendum)
Start Hydralazine 25 mg twice a day   Stop Baby Aspirin   Start Eliquis 5 mg twice a day   Check blood pressure daily at different times.Keep diary of readings bring readings to appointment    Schedule appointment with Hypertension Clinic in 2 week    Schedule renal artery ultrasound    Your physician recommends that you schedule a follow-up appointment in: 3 months with Dr.Berry

## 2016-02-12 NOTE — Assessment & Plan Note (Signed)
On statin Rx 

## 2016-02-12 NOTE — Telephone Encounter (Signed)
Called and scheduled renal doppler for the patient.  She had mentioned she had forgotten to let Lurena Joiner know that she was born with a congenital smaller kidney.  I mailed a calendar with her doppler appt in October and Dr. Gwenlyn Found appointment in December along with instructions for the test and the explanation of billing of her test.

## 2016-02-12 NOTE — Progress Notes (Signed)
02/12/2016 Valerie Terrell   12/16/1932  [id number]  Primary Physician Amador Cunas, [place] Primary Cardiologist: Dr Gwenlyn Found  HPI:  Pleasant 82 y/o female with a history of PAF. She had been placed on Xarelto in the past but apparently had what sounds like orthostatic syncope and suffered a "tiny" SAH and Xarelto was discontinued and she as placed on ASA.[name] She recently presented 01/30/16 with a head ache and blurred vision in her OS. She had a retinal artery occlusion and evidence of bilateral micro emboli on MRI c/w embolic stroke. After consultation with the Neurologist she was placed on Eliquis 5 mg BID. During this hospitalization she had also complained of chest pain and had a slightly elevated Troponin. Echo and Myoview were essentially normal.  She is in the office today for follow up, he daughter accompanied her. She has been doing well, tolerating Eliquis, no falls or syncope. She was in NSR in the hospital and is in NSR today on exam.    Current Outpatient Prescriptions  Medication Sig Dispense Refill  . amLODipine (NORVASC) 5 MG tablet Take 2 tablets (10 mg total) by mouth every morning. 30 tablet 0  . apixaban (ELIQUIS) 5 MG TABS tablet Take 1 tablet (5 mg total) by mouth 2 (two) times daily. 60 tablet 0  . aspirin 81 MG chewable tablet Chew 81 mg by mouth daily.    [name] atenolol-chlorthalidone (TENORETIC) 100-25 MG tablet Take 1 tablet by mouth every morning.    . calcium carbonate (OS-CAL) 600 MG TABS tablet Take 600 mg by mouth 2 (two) times daily.    [name] losartan (COZAAR) 100 MG tablet Take 100 mg by mouth daily.    . Magnesium Gluconate (MAGNESIUM 27) 500 (27 Mg) MG TABS Take 500 mg by mouth daily.    . Multiple Vitamin (MULTIVITAMIN) tablet Take 1 tablet by mouth daily.    . pravastatin (PRAVACHOL) 40 MG tablet Take 40 mg by mouth every evening.    . vitamin C (ASCORBIC ACID) 500 MG tablet Take 500 mg by mouth daily.    [name] dicyclomine (BENTYL) 10 MG capsule Take 10 mg by mouth 4 (four)  times daily as needed.     No current facility-administered medications for this visit.     Allergies  Allergen Reactions  . Sulfa Antibiotics Other (See Comments)    Reaction unknown  . Nickel Rash    Social History   Social History  . Marital status: Widowed    Spouse name: N/A  . Number of children: N/A  . Years of education: N/A   Occupational History  . Not on file.   Social History Main Topics  . Smoking status: Never Smoker  . Smokeless tobacco: Never Used  . Alcohol use Yes     Comment: wine  . Drug use: No  . Sexual activity: No   Other Topics Concern  . Not on file   Social History Narrative  . No narrative on file     Review of Systems: General: negative for chills, fever, night sweats or weight changes.  Cardiovascular: negative for chest pain, dyspnea on exertion, edema, orthopnea, palpitations, paroxysmal nocturnal dyspnea or shortness of breath Dermatological: negative for rash Respiratory: negative for cough or wheezing Urologic: negative for hematuria Abdominal: negative for nausea, vomiting, diarrhea, bright red blood per rectum, melena, or hematemesis Neurologic: negative for visual changes, syncope, or dizziness All other systems reviewed and are otherwise negative except as noted above.    Blood pressure (!) 195/78,  pulse 60, height 5' 0.5" (1.537 m), weight 148 lb 12.8 oz (67.5 kg), SpO2 95 %.  General appearance: alert, cooperative and no distress Neck: no carotid bruit and no JVD Lungs: clear to auscultation bilaterally Heart: regular rate and rhythm Extremities: extremities normal, atraumatic, no cyanosis or edema Skin: Skin color, texture, turgor normal. No rashes or lesions Neurologic: Grossly normal   ASSESSMENT AND PLAN:   Cardioembolic stroke (HCC) Embolic stroke (retinal artery occlusion and embolic bilateral micro emboli on MRI) 01/30/16  PAF (paroxysmal atrial fibrillation) (HCC) CHADs VASc= 5  Essential  hypertension Uncontrolled  HLD (hyperlipidemia) On statin Rx   PLAN  I discussed her case with Dr Gwenlyn Found. We have recommended stopping her ASA. Her repeat B/P by me was 172/80. She says her B/P at home runs "140's". Hydralazine 25 mg BID was added.  Dr Gwenlyn Found would like her to monitor her B/P daily and f/u with our pharmacist. 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 The pt will be contacted to have renal artery dopplers as well to r/o renovascular hypertension.   Kerin Ransom PA-C 02/12/2016 2:19 PM

## 2016-02-12 NOTE — Assessment & Plan Note (Signed)
Embolic stroke (retinal artery occlusion and embolic bilateral micro emboli on MRI) 01/30/16

## 2016-02-12 NOTE — Assessment & Plan Note (Signed)
Uncontrolled 

## 2016-02-13 ENCOUNTER — Telehealth: Payer: Self-pay | Admitting: Cardiovascular Disease

## 2016-02-13 NOTE — Telephone Encounter (Signed)
New message    Pt states she came to see [facility] on yesterday and was given a new med and she wants to confirm.     Pt c/o medication issue:  1. Name of Medication: Hydralazine 25 mg  2. How are you currently taking this medication (dosage and times per day)?  2 each day  3. Are you having a reaction (difficulty breathing--STAT)? No   4. What is your medication issue?  Needs to know if the dosage is to strong. She is apprehensive about taking the meds.

## 2016-02-13 NOTE — Telephone Encounter (Signed)
Spoke with pt, questions regarding hydralazine answered. Pt will track her bp and call with her concerns.

## 2016-02-21 ENCOUNTER — Other Ambulatory Visit: Payer: Self-pay | Admitting: Cardiology

## 2016-02-21 DIAGNOSIS — I480 Paroxysmal atrial fibrillation: Secondary | ICD-10-CM

## 2016-02-21 DIAGNOSIS — I10 Essential (primary) hypertension: Secondary | ICD-10-CM

## 2016-02-21 DIAGNOSIS — I639 Cerebral infarction, unspecified: Secondary | ICD-10-CM

## 2016-02-26 ENCOUNTER — Ambulatory Visit (HOSPITAL_COMMUNITY)
Admission: RE | Admit: 2016-02-26 | Discharge: 2016-02-26 | Disposition: A | Discharge status: Home / Self Care | Payer: Medicare Other | Source: Ambulatory Visit | Attending: Cardiovascular Disease | Admitting: Cardiovascular Disease

## 2016-02-26 DIAGNOSIS — N261 Atrophy of kidney (terminal): Secondary | ICD-10-CM | POA: Insufficient documentation

## 2016-02-26 DIAGNOSIS — I700 Atherosclerosis of aorta: Secondary | ICD-10-CM | POA: Diagnosis not present

## 2016-02-26 DIAGNOSIS — I639 Cerebral infarction, unspecified: Secondary | ICD-10-CM

## 2016-02-26 DIAGNOSIS — I480 Paroxysmal atrial fibrillation: Secondary | ICD-10-CM

## 2016-02-26 DIAGNOSIS — I708 Atherosclerosis of other arteries: Secondary | ICD-10-CM | POA: Insufficient documentation

## 2016-02-26 DIAGNOSIS — I701 Atherosclerosis of renal artery: Secondary | ICD-10-CM | POA: Insufficient documentation

## 2016-02-26 DIAGNOSIS — I10 Essential (primary) hypertension: Secondary | ICD-10-CM

## 2016-02-26 HISTORY — PX: OTHER SURGICAL HISTORY: SHX169

## 2016-02-27 ENCOUNTER — Encounter (HOSPITAL_COMMUNITY): Payer: Self-pay | Admitting: Emergency Medicine

## 2016-02-27 ENCOUNTER — Observation Stay (HOSPITAL_COMMUNITY)
Admission: EM | Admit: 2016-02-27 | Discharge: 2016-02-28 | Disposition: A | Discharge status: Home / Self Care | Payer: Medicare Other | Attending: Internal Medicine | Admitting: Internal Medicine

## 2016-02-27 ENCOUNTER — Telehealth: Payer: Self-pay | Admitting: Cardiovascular Disease

## 2016-02-27 ENCOUNTER — Emergency Department (HOSPITAL_COMMUNITY): Payer: Medicare Other

## 2016-02-27 DIAGNOSIS — N270 Small kidney, unilateral: Secondary | ICD-10-CM | POA: Diagnosis not present

## 2016-02-27 DIAGNOSIS — E785 Hyperlipidemia, unspecified: Secondary | ICD-10-CM | POA: Diagnosis not present

## 2016-02-27 DIAGNOSIS — R079 Chest pain, unspecified: Secondary | ICD-10-CM | POA: Diagnosis present

## 2016-02-27 DIAGNOSIS — Z8679 Personal history of other diseases of the circulatory system: Secondary | ICD-10-CM | POA: Diagnosis not present

## 2016-02-27 DIAGNOSIS — N182 Chronic kidney disease, stage 2 (mild): Secondary | ICD-10-CM | POA: Diagnosis not present

## 2016-02-27 DIAGNOSIS — I10 Essential (primary) hypertension: Secondary | ICD-10-CM | POA: Diagnosis present

## 2016-02-27 DIAGNOSIS — R072 Precordial pain: Secondary | ICD-10-CM | POA: Diagnosis not present

## 2016-02-27 DIAGNOSIS — Z8673 Personal history of transient ischemic attack (TIA), and cerebral infarction without residual deficits: Secondary | ICD-10-CM | POA: Insufficient documentation

## 2016-02-27 DIAGNOSIS — Z79899 Other long term (current) drug therapy: Secondary | ICD-10-CM | POA: Diagnosis not present

## 2016-02-27 DIAGNOSIS — I129 Hypertensive chronic kidney disease with stage 1 through stage 4 chronic kidney disease, or unspecified chronic kidney disease: Secondary | ICD-10-CM | POA: Insufficient documentation

## 2016-02-27 DIAGNOSIS — I480 Paroxysmal atrial fibrillation: Secondary | ICD-10-CM | POA: Insufficient documentation

## 2016-02-27 DIAGNOSIS — Z7901 Long term (current) use of anticoagulants: Secondary | ICD-10-CM | POA: Insufficient documentation

## 2016-02-27 DIAGNOSIS — N179 Acute kidney failure, unspecified: Secondary | ICD-10-CM | POA: Diagnosis not present

## 2016-02-27 LAB — BASIC METABOLIC PANEL
Anion gap: 8 (ref 5–15)
Anion gap: 7 (ref 5–15)
BUN: 34 mg/dL — AB (ref 6–20)
BUN: 30 mg/dL — ABNORMAL HIGH (ref 6–20)
CALCIUM: 9.1 mg/dL (ref 8.9–10.3)
CHLORIDE: 103 mmol/L (ref 101–111)
CO2: 26 mmol/L (ref 22–32)
CO2: 25 mmol/L (ref 22–32)
CREATININE: 1.63 mg/dL — AB (ref 0.44–1.00)
CREATININE: 1.51 mg/dL — AB (ref 0.44–1.00)
Calcium: 9.5 mg/dL (ref 8.9–10.3)
Chloride: 105 mmol/L (ref 101–111)
GFR calc Af Amer: 33 mL/min — ABNORMAL LOW (ref >60)
GFR, EST AFRICAN AMERICAN: 36 mL/min — AB (ref >60)
GFR, EST NON AFRICAN AMERICAN: 29 mL/min — AB (ref >60)
GFR, EST NON AFRICAN AMERICAN: 31 mL/min — AB (ref >60)
GLUCOSE: 104 mg/dL — AB (ref 65–99)
Glucose, Bld: 132 mg/dL — ABNORMAL HIGH (ref 65–99)
POTASSIUM: 4.5 mmol/L (ref 3.5–5.1)
Potassium: 3.6 mmol/L (ref 3.5–5.1)
SODIUM: 137 mmol/L (ref 135–145)
Sodium: 137 mmol/L (ref 135–145)

## 2016-02-27 LAB — URINALYSIS, ROUTINE W REFLEX MICROSCOPIC
Bilirubin Urine: NEGATIVE
GLUCOSE, UA: NEGATIVE mg/dL
HGB URINE DIPSTICK: NEGATIVE
Ketones, ur: NEGATIVE mg/dL
Nitrite: NEGATIVE
Protein, ur: NEGATIVE mg/dL
SPECIFIC GRAVITY, URINE: 1.012 (ref 1.005–1.030)
pH: 7.5 (ref 5.0–8.0)

## 2016-02-27 LAB — URINE MICROSCOPIC-ADD ON: RBC / HPF: NONE SEEN RBC/hpf (ref 0–5)

## 2016-02-27 LAB — I-STAT TROPONIN, ED
TROPONIN I, POC: 0.02 ng/mL (ref 0.00–0.08)
Troponin i, poc: 0.02 ng/mL (ref 0.00–0.08)

## 2016-02-27 LAB — CBC
HEMATOCRIT: 39 % (ref 36.0–46.0)
Hemoglobin: 12.6 g/dL (ref 12.0–15.0)
MCH: 30 pg (ref 26.0–34.0)
MCHC: 32.3 g/dL (ref 30.0–36.0)
MCV: 92.9 fL (ref 78.0–100.0)
PLATELETS: 184 10*3/uL (ref 150–400)
RBC: 4.2 MIL/uL (ref 3.87–5.11)
RDW: 13.6 % (ref 11.5–15.5)
WBC: 5.5 10*3/uL (ref 4.0–10.5)

## 2016-02-27 MED ORDER — SODIUM CHLORIDE 0.9 % IV BOLUS (SEPSIS)
500.0000 mL | Freq: Once | INTRAVENOUS | Status: AC
  Administered 2016-02-27: 500 mL via INTRAVENOUS

## 2016-02-27 MED ORDER — NITROGLYCERIN 0.4 MG SL SUBL
0.4000 mg | SUBLINGUAL_TABLET | SUBLINGUAL | Status: DC | PRN
  Filled 2016-02-27: qty 1

## 2016-02-27 NOTE — ED Provider Notes (Signed)
[facility] DEPT Provider Note   CSN: [id number] Arrival date & time: 02/27/16  1428     History   Chief Complaint Chief Complaint  Patient presents with  . Chest Pain    HPI Valerie Terrell is a 82 y.o. female.   Chest Pain   This is a new problem. The current episode started 12 to 24 hours ago. The problem has not changed since onset.The pain is associated with movement. The pain is present in the substernal region. The pain is moderate. The quality of the pain is described as sharp and brief. The pain radiates to the upper back. The symptoms are aggravated by certain positions. Pertinent negatives include no abdominal pain, no back pain, no claudication, no cough, no diaphoresis, no fever, no nausea, no palpitations, no shortness of breath and no vomiting. She has tried nothing for the symptoms.  Her past medical history is significant for arrhythmia and strokes.  Pertinent negatives for past medical history include no seizures.  Her family medical history is significant for sudden death.    Past Medical History:  Diagnosis Date  . Atrial fibrillation ([place])   . Hyperlipidemia   . Hypertension   . Small kidney    left  . Stroke [facility])     Patient Active Problem List   Diagnosis Date Noted  . Chest pain   . HLD (hyperlipidemia)   . Acute CVA (cerebrovascular accident) ([place]) 01/29/2016  . PAF (paroxysmal atrial fibrillation) ([place]) 01/28/2016  . Essential hypertension 01/28/2016  . Cardioembolic stroke ([place]) 99/83/3825  . 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 Personal history of subarachnoid hemorrhage 01/28/2016  . CKD (chronic kidney disease), stage II 01/28/2016    Past Surgical History:  Procedure Laterality Date  . CESAREAN SECTION    . TONSILLECTOMY      OB History    No data available       Home Medications    Prior to Admission medications   Medication Sig Start Date End Date Taking? Authorizing Provider  amLODipine  (NORVASC) 5 MG tablet Take 2 tablets (10 mg total) by mouth every morning. 02/01/16   Jennifer Chahn-Yang Choi, DO  apixaban (ELIQUIS) 5 MG TABS tablet Take 1 tablet (5 mg total) by mouth 2 (two) times daily. 02/12/16   Erlene Quan, PA-C  atenolol-chlorthalidone (TENORETIC) 100-25 MG tablet Take 1 tablet by mouth every morning. 01/27/16   Historical Provider, MD  calcium carbonate (OS-CAL) 600 MG TABS tablet Take 600 mg by mouth 2 (two) times daily.    Historical Provider, MD  dicyclomine (BENTYL) 10 MG capsule Take 10 mg by mouth 4 (four) times daily as needed. 01/27/16 02/10/16  Historical Provider, MD  hydrALAZINE (APRESOLINE) 25 MG tablet Take 25 mg twice a day 02/12/16   Erlene Quan, PA-C  losartan (COZAAR) 100 MG tablet Take 100 mg by mouth daily. 01/27/16   Historical Provider, MD  Magnesium Gluconate (MAGNESIUM 27) 500 (27 Mg) MG TABS Take 500 mg by mouth daily.    Historical Provider, MD  Multiple Vitamin (MULTIVITAMIN) tablet Take 1 tablet by mouth daily.    Historical Provider, MD  pravastatin (PRAVACHOL) 40 MG tablet Take 40 mg by mouth every evening. 07/16/15   Historical Provider, MD  vitamin C (ASCORBIC ACID) 500 MG tablet Take 500 mg by mouth daily.    Historical Provider, MD    Family History Family History  Problem Relation Age of Onset  . Sudden Cardiac Death Neg Hx  Social History Social History  Substance Use Topics  . Smoking status: Never Smoker  . Smokeless tobacco: Never Used  . Alcohol use Yes     Comment: wine     Allergies   Sulfa antibiotics and Nickel   Review of Systems Review of Systems  Constitutional: Negative for chills, diaphoresis and fever.  HENT: Negative for ear pain and sore throat.   Eyes: Negative for pain and visual disturbance.  Respiratory: Negative for cough and shortness of breath.   Cardiovascular: Positive for chest pain. Negative for palpitations and claudication.  Gastrointestinal: Negative for abdominal pain, nausea and  vomiting.  Genitourinary: Negative for dysuria and hematuria.  Musculoskeletal: Negative for arthralgias and back pain.  Skin: Negative for color change and rash.  Neurological: Negative for seizures and syncope.  All other systems reviewed and are negative.    Physical Exam Updated Vital Signs BP 183/63 (BP Location: Right Arm)   Pulse 60   Temp 98.5 F (36.9 C) (Oral)   Resp 17   Ht 5' (1.524 m)   Wt 67.1 kg   SpO2 96%   BMI 28.90 kg/m   Physical Exam  Constitutional: She is oriented to person, place, and time. She appears well-developed and well-nourished.  HENT:  Head: Normocephalic and atraumatic.  Eyes: Conjunctivae are normal. Pupils are equal, round, and reactive to light.  Neck: Normal range of motion. Neck supple.  Cardiovascular: Normal rate and regular rhythm.   Pulmonary/Chest: Effort normal and breath sounds normal.  Abdominal: Soft. There is no tenderness.  Musculoskeletal: She exhibits tenderness (peristernal, inferior portion, left.). She exhibits no edema.  Neurological: She is alert and oriented to person, place, and time.  Skin: Skin is warm and dry.  Psychiatric: She has a normal mood and affect.  Nursing note and vitals reviewed.    ED Treatments / Results  Labs (all labs ordered are listed, but only abnormal results are displayed) Labs Reviewed  BASIC METABOLIC PANEL - Abnormal; Notable for the following:       Result Value   Glucose, Bld 104 (*)    BUN 34 (*)    Creatinine, Ser 1.63 (*)    GFR calc non Af Amer 29 (*)    GFR calc Af Amer 33 (*)    All other components within normal limits  URINALYSIS, ROUTINE W REFLEX MICROSCOPIC (NOT AT [facility]) - Abnormal; Notable for the following:    APPearance CLOUDY (*)    Leukocytes, UA LARGE (*)    All other components within normal limits  URINE MICROSCOPIC-ADD ON - Abnormal; Notable for the following:    Squamous Epithelial / LPF 0-5 (*)    Bacteria, UA MANY (*)    All other components within  normal limits  BASIC METABOLIC PANEL - Abnormal; Notable for the following:    Glucose, Bld 132 (*)    BUN 30 (*)    Creatinine, Ser 1.51 (*)    GFR calc non Af Amer 31 (*)    GFR calc Af Amer 36 (*)    All other components within normal limits  CBC  I-STAT TROPOININ, ED  I-STAT TROPOININ, ED    EKG  EKG Interpretation  Date/Time:  Thursday February 27 2016 14:32:54 EDT Ventricular Rate:  57 PR Interval:  188 QRS Duration: 82 QT Interval:  400 QTC Calculation: 389 R Axis:   71 Text Interpretation:  Sinus bradycardia Artifact Abnormal ekg Confirmed by Carmin Muskrat  MD ([id number]) on 02/27/2016 3:49:17 PM  Radiology Dg Chest 2 View  Result Date: 02/27/2016 CLINICAL DATA:  Patient with left chest pain radiating into the left shoulder. Shortness of breath. EXAM: CHEST  2 VIEW COMPARISON:  Chest radiograph 01/29/2016. FINDINGS: Stable enlarged cardiac and mediastinal contours. Aortic vascular calcifications. No consolidative pulmonary opacities. No pleural effusion or pneumothorax. Mid thoracic spine degenerative changes. Old right lateral rib fractures. IMPRESSION: Cardiomegaly.  No acute cardiopulmonary process. Electronically Signed   By: Lovey Newcomer M.D.   On: 02/27/2016 15:30    Procedures Procedures (including critical care time)  Medications Ordered in ED Medications - No data to display   Initial Impression / Assessment and Plan / ED Course  I have reviewed the triage vital signs and the nursing notes.  Pertinent labs & imaging results that were available during my care of the patient were reviewed by me and considered in my medical decision making (see chart for details).  Clinical Course   Ms. Riel is an 82 year old female with PMH significant for a-fib, HTN, HLD, CVA, and left kidney atrophy who presents for chest pain.  The pain is reproducible on palpation.  She has recently had a cardiac work up and stress test.  EKG obtained demonstrates sinus  bradycardia with artifact.  CXR obtained, personally reviewed by me, demonstrates cariomegaly without acute cardiac or pulmonary processes.  Labs obtained including CBC, BMP, troponin and urine studies. Significant for elevated creatinine consistent with AKI.  Negative delta troponins.  Doubt ACS.  Given fluid bolus for AKI.  Repeat BMP shows improvement to Cr, but still AKI range.  Given solitary kidney and AKI, patient admitted.  Final Clinical Impressions(s) / ED Diagnoses   Final diagnoses:  Precordial pain  Acute kidney injury [facility])    New Prescriptions New Prescriptions   No medications on file     Elveria Rising, MD 02/28/16 0031    Carmin Muskrat, MD 03/04/16 2204

## 2016-02-27 NOTE — Telephone Encounter (Signed)
New message    Pt c/o of Chest Pain: STAT if CP now or developed within 24 hours  1. Are you having CP right now? Yes  Pain level : 2 -3   2. Are you experiencing any other symptoms (ex. SOB, nausea, vomiting, sweating)? No   3. How long have you been experiencing CP? Last night   4. Is your CP continuous or coming and going? Coming  /going   5. Have you taken Nitroglycerin? Patient states does not have medication.  ?

## 2016-02-27 NOTE — Telephone Encounter (Signed)
Received incoming call from patient. She is experiencing pain in the left sternum of chest sometimes towards the left clavicle area. It started last evening, she slept well but it has been ongoing through today up until now. It last 2-3 seconds but occurs every 2-3 minutes. She has been resting all morning with no relief. She denies dizziness, nausea, vomiting or palpitations. BP at 0930  148/63 HR 49   And BP at 10:05am 121/60 HR 100.  Taken to Dr Oval Linsey, DOD, for advice. Dr Oval Linsey advised for patient to go to the ED for evaluation.  Patient notified and patient verbalized understanding to go to the ED, she will have someone drive her to [place] notified of patient coming.

## 2016-02-27 NOTE — ED Triage Notes (Signed)
Pt states since last night she has been having intermittent left sided chest pain while just sitting during no exertion. Pt is warm and dry.

## 2016-02-27 NOTE — ED Notes (Signed)
Dr. Marlou Sa MD at bedside.

## 2016-02-28 ENCOUNTER — Encounter (HOSPITAL_COMMUNITY): Payer: Self-pay

## 2016-02-28 DIAGNOSIS — R079 Chest pain, unspecified: Secondary | ICD-10-CM | POA: Diagnosis not present

## 2016-02-28 DIAGNOSIS — N179 Acute kidney failure, unspecified: Secondary | ICD-10-CM | POA: Diagnosis not present

## 2016-02-28 DIAGNOSIS — R0789 Other chest pain: Secondary | ICD-10-CM

## 2016-02-28 DIAGNOSIS — I10 Essential (primary) hypertension: Secondary | ICD-10-CM

## 2016-02-28 LAB — TROPONIN I
Troponin I: <0.03 ng/mL (ref <0.03)
Troponin I: <0.03 ng/mL (ref <0.03)

## 2016-02-28 MED ORDER — AMLODIPINE BESYLATE 10 MG PO TABS
10.0000 mg | ORAL_TABLET | Freq: Every morning | ORAL | Status: DC
  Administered 2016-02-28: 10 mg via ORAL
  Filled 2016-02-28: qty 1

## 2016-02-28 MED ORDER — PRAVASTATIN SODIUM 40 MG PO TABS: 40.0000 mg | ORAL_TABLET | Freq: Every evening | ORAL | Status: DC

## 2016-02-28 MED ORDER — MAGNESIUM OXIDE 400 (241.3 MG) MG PO TABS
400.0000 mg | ORAL_TABLET | Freq: Every day | ORAL | Status: DC
  Filled 2016-02-28: qty 1

## 2016-02-28 MED ORDER — ATENOLOL 100 MG PO TABS
100.0000 mg | ORAL_TABLET | Freq: Every day | ORAL | Status: DC
  Administered 2016-02-28: 100 mg via ORAL
  Filled 2016-02-28: qty 1

## 2016-02-28 MED ORDER — HYDRALAZINE HCL 20 MG/ML IJ SOLN: 10.0000 mg | INTRAMUSCULAR | Status: DC | PRN

## 2016-02-28 MED ORDER — HYDRALAZINE HCL 25 MG PO TABS
25.0000 mg | ORAL_TABLET | Freq: Two times a day (BID) | ORAL | Status: DC
  Administered 2016-02-28: 25 mg via ORAL
  Filled 2016-02-28 (×2): qty 1

## 2016-02-28 MED ORDER — SODIUM CHLORIDE 0.9 % IV SOLN
INTRAVENOUS | Status: DC
  Administered 2016-02-28 (×2): via INTRAVENOUS

## 2016-02-28 MED ORDER — APIXABAN 5 MG PO TABS
5.0000 mg | ORAL_TABLET | Freq: Two times a day (BID) | ORAL | Status: DC
  Administered 2016-02-28: 5 mg via ORAL
  Filled 2016-02-28 (×2): qty 1

## 2016-02-28 MED ORDER — ONDANSETRON HCL 4 MG/2ML IJ SOLN: 4.0000 mg | Freq: Four times a day (QID) | INTRAMUSCULAR | Status: DC | PRN

## 2016-02-28 MED ORDER — CALCIUM CARBONATE 1250 (500 CA) MG PO TABS
1250.0000 mg | ORAL_TABLET | Freq: Two times a day (BID) | ORAL | Status: DC
  Administered 2016-02-28: 1250 mg via ORAL
  Filled 2016-02-28 (×2): qty 1

## 2016-02-28 MED ORDER — ACETAMINOPHEN 325 MG PO TABS: 650.0000 mg | ORAL_TABLET | ORAL | Status: DC | PRN

## 2016-02-28 NOTE — H&P (Signed)
History and Physical    Valerie Terrell HGD:[id number] DOB: [birth year] DOA: 02/27/2016  PCP: Amador Cunas, FNP  Patient coming from: Home   Chief Complaint: Chest pain   HPI: Valerie Terrell is a 82 y.o. female with a history of atrial fibrillation, recent admission for a stroke last month presents with chest pain the night before this which lasted for a few seconds. Left anterior chest wall did not recur. Since the patient was concerned she came to ER. In the ED, her chest xray was unremarkable amd troponins and EKG were negative. Creatinine was elevated. One exam, patients chest pain is reproducible on deep palpation. She denies any shortness of breath. Patient is admitted for observation of chest pain and acute renal failure.   ED Course: Patient was given 1L of fluid bolus.   Review of Systems: As per HPI, rest all negative.   Past Medical History:  Diagnosis Date  . Atrial fibrillation ([place])   . Hyperlipidemia   . Hypertension   . Small kidney    left  . Stroke [facility])     Past Surgical History:  Procedure Laterality Date  . CESAREAN SECTION    . TONSILLECTOMY       reports that she has never smoked. She has never used smokeless tobacco. She reports that she drinks alcohol. She reports that she does not use drugs.  Allergies  Allergen Reactions  . Nickel Rash  . Sulfa Antibiotics Other (See Comments)    Reaction unknown    Family History  Problem Relation Age of Onset  . Sudden Cardiac Death Neg Hx     Prior to Admission medications   Medication Sig Start Date End Date Taking? Authorizing Provider  amLODipine (NORVASC) 5 MG tablet Take 2 tablets (10 mg total) by mouth every morning. 02/01/16  Yes Jennifer Chahn-Yang Choi, DO  apixaban (ELIQUIS) 5 MG TABS tablet Take 1 tablet (5 mg total) by mouth 2 (two) times daily. 02/12/16  Yes Luke K Kilroy, PA-C  atenolol-chlorthalidone (TENORETIC) 100-25 MG tablet Take 1 tablet by mouth every morning. 01/27/16  Yes Historical  Provider, MD  calcium carbonate (OS-CAL) 600 MG TABS tablet Take 600 mg by mouth 2 (two) times daily.   Yes Historical Provider, MD  dicyclomine (BENTYL) 10 MG capsule Take 10 mg by mouth 4 (four) times daily as needed for spasms.  01/27/16 02/27/16 Yes Historical Provider, MD  hydrALAZINE (APRESOLINE) 25 MG tablet Take 25 mg twice a day Patient taking differently: Take 25 mg by mouth 2 (two) times daily. Take 25 mg twice a day 02/12/16  Yes Luke K Kilroy, PA-C  losartan (COZAAR) 100 MG tablet Take 100 mg by mouth daily. 01/27/16  Yes Historical Provider, MD  Magnesium Gluconate (MAGNESIUM 27) 500 (27 Mg) MG TABS Take 500 mg by mouth daily.   Yes Historical Provider, MD  Multiple Vitamin (MULTIVITAMIN) tablet Take 1 tablet by mouth daily.   Yes Historical Provider, MD  pravastatin (PRAVACHOL) 40 MG tablet Take 40 mg by mouth every evening. 07/16/15  Yes Historical Provider, MD  vitamin C (ASCORBIC ACID) 500 MG tablet Take 500 mg by mouth daily.   Yes Historical Provider, MD    Physical Exam: Vitals:   02/27/16 2000 02/27/16 2225 02/27/16 2230 02/28/16 0003  BP: 158/82 166/57 149/61 (!) 186/93  Pulse: (!) 54  62 63  Resp: 23 20 18 18   Temp:    97.7 F (36.5 C)  TempSrc:    Oral  SpO2: 95%  96% 95%  Weight:    68.5 kg (151 lb)  Height:    5' (1.524 m)     Vitals:   02/27/16 2000 02/27/16 2225 02/27/16 2230 02/28/16 0003  BP: 158/82 166/57 149/61 (!) 186/93  Pulse: (!) 54  62 63  Resp: 23 20 18 18   Temp:    97.7 F (36.5 C)  TempSrc:    Oral  SpO2: 95%  96% 95%  Weight:    68.5 kg (151 lb)  Height:    5' (1.524 m)   Constitutional: Appears calm and comfortable, well built  Eyes: Anicteric, no pallor  ENMT: No discharge from the ears, eyes, nose, and mouth  Neck: No JVD, no neck rigidity Respiratory: CTA, no wheezes or rhonchi Cardiovascular: No murmurs, rubs, or gallops  Abdomen: Soft, non tender, and non distended  Musculoskeletal: Chest pain is reproducable on paption  Skin:  No rashes, lesions, or ulcers  Neurological: CN 2-12 grossly intact Psychiatric: Judgement and insight intact, oriented x3.    Labs on Admission: I have personally reviewed following labs and imaging studies  CBC:  Recent Labs Lab 02/27/16 1436  WBC 5.5  HGB 12.6  HCT 39.0  MCV 92.9  PLT 440   Basic Metabolic Panel:  Recent Labs Lab 02/27/16 1436 02/27/16 2150  NA 137 137  K 4.5 3.6  CL 103 105  CO2 26 25  GLUCOSE 104* 132*  BUN 34* 30*  CREATININE 1.63* 1.51*  CALCIUM 9.5 9.1   GFR: Estimated Creatinine Clearance: 25.7 mL/min (by C-G formula based on SCr of 1.51 mg/dL (H)). Liver Function Tests: No results for input(s): AST, ALT, ALKPHOS, BILITOT, PROT, ALBUMIN in the last 168 hours. No results for input(s): LIPASE, AMYLASE in the last 168 hours. No results for input(s): AMMONIA in the last 168 hours. Coagulation Profile: No results for input(s): INR, PROTIME in the last 168 hours. Cardiac Enzymes: No results for input(s): CKTOTAL, CKMB, CKMBINDEX, TROPONINI in the last 168 hours. BNP (last 3 results) No results for input(s): PROBNP in the last 8760 hours. HbA1C: No results for input(s): HGBA1C in the last 72 hours. CBG: No results for input(s): GLUCAP in the last 168 hours. Lipid Profile: No results for input(s): CHOL, HDL, LDLCALC, TRIG, CHOLHDL, LDLDIRECT in the last 72 hours. Thyroid Function Tests: No results for input(s): TSH, T4TOTAL, FREET4, T3FREE, THYROIDAB in the last 72 hours. Anemia Panel: No results for input(s): VITAMINB12, FOLATE, FERRITIN, TIBC, IRON, RETICCTPCT in the last 72 hours. Urine analysis:    Component Value Date/Time   COLORURINE YELLOW 02/27/2016 1727   APPEARANCEUR CLOUDY (A) 02/27/2016 1727   LABSPEC 1.012 02/27/2016 1727   PHURINE 7.5 02/27/2016 1727   GLUCOSEU NEGATIVE 02/27/2016 1727   HGBUR NEGATIVE 02/27/2016 1727   BILIRUBINUR NEGATIVE 02/27/2016 1727   KETONESUR NEGATIVE 02/27/2016 1727   PROTEINUR NEGATIVE  02/27/2016 1727   NITRITE NEGATIVE 02/27/2016 1727   LEUKOCYTESUR LARGE (A) 02/27/2016 1727   Sepsis Labs: @LABRCNTIP (procalcitonin:4,lacticidven:4) )No results found for this or any previous visit (from the past 240 hour(s)).   Radiological Exams on Admission: Dg Chest 2 View  Result Date: 02/27/2016 CLINICAL DATA:  Patient with left chest pain radiating into the left shoulder. Shortness of breath. EXAM: CHEST  2 VIEW COMPARISON:  Chest radiograph 01/29/2016. FINDINGS: Stable enlarged cardiac and mediastinal contours. Aortic vascular calcifications. No consolidative pulmonary opacities. No pleural effusion or pneumothorax. Mid thoracic spine degenerative changes. Old right lateral rib fractures. IMPRESSION: Cardiomegaly.  No acute cardiopulmonary process. Electronically Signed  By: Lovey Newcomer M.D.   On: 02/27/2016 15:30    EKG: Independently reviewed. EKG shows sinus bradycardia with no SV changes   Assessment/Plan Active Problems:   PAF (paroxysmal atrial fibrillation) (HCC)   Essential hypertension   Cardioembolic stroke [facility])   Personal history of subarachnoid hemorrhage   Chest pain   Acute kidney injury ([place])    1. Chest pain. Appears atypical. Patient had a negative stress last month. At this time we will cycle cardiac markers. If negative there no further workup.   2. Acute renal failure- Will hold cozaar and  Hydrochlorothiazide. Continue with hydration and follow metabolic panel. Patient has a single function of kidney.  3. HTN - Holding off on cozaar and Hydrochlorothiazide due to acute renal failure. Continue atenolol, Norvasc, and hydralazine. Continue  PRN hydralazine if pressure is over 449 systolic.  4. Paroxysmal Afib- Chad2vasc score is 5. She was recently started on elquis. Rate controlled  5. Recent stroke. On eliquis   Patients UA is concerning for UTI but since she has no other symptoms I will not treat.     DVT prophylaxis: Eliquis  Code Status: Full    Family Communication: Discussed with patient  Disposition Plan: Home  Consults called: None  Admission status: Observation    Kenyon Ana MD [facility] Pager [phone].  If 7PM-7AM, please contact night-coverage [email or web address] Password [email or web address]  02/28/2016, 3:52 AM

## 2016-02-28 NOTE — Discharge Summary (Signed)
Physician Discharge Summary  Valerie Terrell W[facility]:[id number] DOB: 1933-04-20 DOA: 02/27/2016  PCP: Amador Cunas, FNP  Admit date: 02/27/2016 Discharge date: 02/28/2016  Admitted From: Home  Disposition:  Home   Recommendations for Outpatient Follow-up:  1. Follow up with PCP in 1- weeks  Home Health: No  Equipment/Devices: No   Discharge Condition: Stable  CODE STATUS: Full  Diet recommendation: Heart Healthy   Brief/Interim Summary: This is a 82 year old female who presents to the hospital with acute chest pain. Her pain was precordial, intermittent, not exertional related, for a few seconds in duration. Initial physical examination blood pressure was 158/82, heart rate 54, respiratory 23, oxygen saturation 95%. Her pain was reproducible, chest wall palpation. Sodium was 137, potassium 3.6 her creatinine 1.51, BUN 30, cardiac enzymes negative. EKG was negative for ischemic changes. Her chest film was negative for infiltrates, increased lung markings noted, positive old right lateral rib fractures.   Patient was admitted to hospital with the working diagnosis of atypical chest pain rule out acute coronary syndrome.  1. Chest pain. She had serial cardiac enzymes  all rule out for acute coronary syndrome. Patient will continue  statin therapy with pravastatin.   2. Paroxysmal atrial fibrillation, patient remained on sinus rhythm, continue atenolol and apixaban.  3. Hypertension. Continue pressure regimen with amlodipine, atenolol, chlorthalidone, hydralazine, losartan.  4. Dyslipidemia continue pravastatin   Discharge Diagnoses:  Active Problems:   PAF (paroxysmal atrial fibrillation) (HCC)   Essential hypertension   Cardioembolic stroke [facility])   Personal history of subarachnoid hemorrhage   Chest pain   Acute kidney injury [facility])    Discharge Instructions  Discharge Instructions    Diet - low sodium heart healthy    Complete by:  As directed    Discharge instructions     Complete by:  As directed    Please follow up with primary care in 7 days.   Increase activity slowly    Complete by:  As directed        Medication List    STOP taking these medications   dicyclomine 10 MG capsule Commonly known as:  BENTYL     TAKE these medications   amLODipine 5 MG tablet Commonly known as:  NORVASC Take 2 tablets (10 mg total) by mouth every morning.   apixaban 5 MG Tabs tablet Commonly known as:  ELIQUIS Take 1 tablet (5 mg total) by mouth 2 (two) times daily.   atenolol-chlorthalidone 100-25 MG tablet Commonly known as:  TENORETIC Take 1 tablet by mouth every morning.   calcium carbonate 600 MG Tabs tablet Commonly known as:  OS-CAL Take 600 mg by mouth 2 (two) times daily.   hydrALAZINE 25 MG tablet Commonly known as:  APRESOLINE Take 25 mg twice a day What changed:  how much to take  how to take this  when to take this  additional instructions   losartan 100 MG tablet Commonly known as:  COZAAR Take 100 mg by mouth daily.   Magnesium 27 500 (27 Mg) MG Tabs Take 500 mg by mouth daily.   multivitamin tablet Take 1 tablet by mouth daily.   pravastatin 40 MG tablet Commonly known as:  PRAVACHOL Take 40 mg by mouth every evening.   vitamin C 500 MG tablet Commonly known as:  ASCORBIC ACID Take 500 mg by mouth daily.      Follow-up Information    JUDGE,ERIN, FNP Follow up in 1 week(s).   Specialty:  Family Medicine Contact information: [address] [place] [address] [phone]          Allergies  Allergen Reactions  . Nickel Rash  . 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 Micro embolic infarctions on MRI. EXAM: CT ANGIOGRAPHY HEAD AND NECK TECHNIQUE: Multidetector CT imaging of the head and neck was performed using  the standard protocol during bolus administration of intravenous contrast. Multiplanar CT image reconstructions and MIPs were obtained to evaluate the vascular anatomy. Carotid stenosis measurements (when applicable) are obtained utilizing NASCET criteria, using the distal internal carotid diameter as the denominator. CONTRAST:  50 cc Isovue 370 COMPARISON:  MRI same day FINDINGS: CT HEAD Generalized atrophy. Ordinary small vessel change of the cerebral hemispheric white matter. No sign of acute infarction, mass lesion, hemorrhage, hydrocephalus or extra-axial collection by CT. There is atherosclerotic calcification of the major vessels at the base of the brain. CTA NECK Aortic arch: Aortic atherosclerosis with extensive irregular plaque. Branching pattern of the brachiocephalic vessels from the arch is normal without flow limiting stenosis. Right carotid system: Common carotid artery widely patent to the bifurcation. Atherosclerotic disease of the carotid bifurcation with minimal diameter at the distal ICA bulb measuring 4 mm. Compared to a more distal cervical ICA diameter of 5 mm, this indicates a 20% stenosis. Left carotid system: Common carotid artery patent to the bifurcation. Atherosclerotic disease at the carotid bifurcation with minimal diameter of the proximal ICA of 5 mm. Compared to a more distal cervical ICA diameter of 5 mm, there is no stenosis. Vertebral arteries:Both vertebral artery origins are widely patent. The vertebral arteries are approximately equal in size an widely patent through the neck. Skeleton: Ordinary spondylosis. Other neck: No mass or lymphadenopathy. Upper chest: Lung apices are clear. CTA HEAD Anterior circulation: Both internal carotid arteries are patent through the skullbase. There is atherosclerotic calcification in the carotid siphon regions but no stenosis greater than 30%. Both ophthalmic arteries show flow. The anterior and middle cerebral vessels are patent without  proximal stenosis, aneurysm or vascular malformation. Posterior circulation: Both vertebral arteries are patent through the foramen magnum. There is some atherosclerotic calcification but no stenosis. Both vertebral arteries are patent to the basilar. No basilar stenosis. Posterior circulation branch vessels appear normal. Venous sinuses: Patent and normal. Anatomic variants: None significant Delayed phase: No abnormal enhancement IMPRESSION: Given the micro embolic infarctions in both cerebral hemispheres, the most significant finding in this case is that of atherosclerosis of the aorta. There is irregular plaque, particularly notable in the descending aorta. The MRI pattern suggest micro embolic disease from the heart or ascending aorta. Atherosclerotic disease at both carotid bifurcations. 20% narrowing on the right and no narrowing on the left. Atherosclerotic disease in both carotid siphon regions but without stenosis greater than 30%. Electronically Signed   By: Nelson Chimes M.D.   On: 01/29/2016 18:07   Dg Chest 2 View  Result Date: 02/27/2016 CLINICAL DATA:  Patient with left chest pain radiating into the left shoulder. Shortness of breath. EXAM: CHEST  2 VIEW COMPARISON:  Chest radiograph 01/29/2016. FINDINGS: Stable enlarged cardiac and mediastinal contours. Aortic vascular calcifications. No consolidative pulmonary opacities. No pleural effusion or pneumothorax. Mid thoracic spine degenerative changes. Old right lateral rib fractures. IMPRESSION: Cardiomegaly.  No acute cardiopulmonary process. Electronically Signed   By: Dian Situ  Rosana Hoes M.D.   On: 02/27/2016 15:30   Dg Chest 2 View  Result Date: 01/30/2016 CLINICAL DATA:  Pulmonary air embolism. EXAM: CHEST  2 VIEW COMPARISON:  Chest radiographs yesterday. FINDINGS: Lower lung volumes from prior exam accentuating the cardiac silhouette. There is also crowding of bronchovascular markings. Bronchial thickening is likely accentuated by lower lung  volumes. Development of bibasilar opacities from prior. No pleural fluid. No pneumothorax. IMPRESSION: Lower lung volumes from prior exam accentuating the cardiac size fat and bronchovascular markings. Bibasilar opacities, likely atelectasis, however aspiration or developing pneumonia could have a similar radiographic appearance. Electronically Signed   By: Jeb Levering M.D.   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 Micro embolic infarctions on MRI. EXAM: CT ANGIOGRAPHY HEAD AND NECK TECHNIQUE: Multidetector CT imaging of the head and neck was performed using the standard protocol during bolus administration of intravenous contrast. Multiplanar CT image reconstructions and MIPs were obtained to evaluate the vascular anatomy. Carotid stenosis measurements (when applicable) are obtained utilizing NASCET criteria, using the distal internal carotid diameter as the denominator. CONTRAST:  50 cc Isovue 370 COMPARISON:  MRI same day FINDINGS: CT HEAD Generalized atrophy. Ordinary small vessel change of the cerebral hemispheric white matter. No sign of acute infarction, mass lesion, hemorrhage, hydrocephalus or extra-axial collection by CT. There is atherosclerotic calcification of the major vessels at the base of the brain. CTA NECK Aortic arch: Aortic atherosclerosis with extensive irregular plaque. Branching pattern of the brachiocephalic vessels from the arch is normal without flow limiting stenosis. Right carotid system: Common carotid artery widely patent to the bifurcation. Atherosclerotic disease of the carotid bifurcation with minimal diameter at the distal ICA bulb measuring 4 mm. Compared to a more distal cervical ICA diameter of 5 mm, this indicates a 20% stenosis. Left carotid system: Common carotid artery patent to the bifurcation. Atherosclerotic disease at the carotid bifurcation with minimal diameter  of the proximal ICA of 5 mm. Compared to a more distal cervical ICA diameter of 5 mm, there is no stenosis. Vertebral arteries:Both vertebral artery origins are widely patent. The vertebral arteries are approximately equal in size an widely patent through the neck. Skeleton: Ordinary spondylosis. Other neck: No mass or lymphadenopathy. Upper chest: Lung apices are clear. CTA HEAD Anterior circulation: Both internal carotid arteries are patent through the skullbase. There is atherosclerotic calcification in the carotid siphon regions but no stenosis greater than 30%. Both ophthalmic arteries show flow. The anterior and middle cerebral vessels are patent without proximal stenosis, aneurysm or vascular malformation. Posterior circulation: Both vertebral arteries are patent through the foramen magnum. There is some atherosclerotic calcification but no stenosis. Both vertebral arteries are patent to the basilar. No basilar stenosis. Posterior circulation branch vessels appear normal. Venous sinuses: Patent and normal. Anatomic variants: None significant Delayed phase: No abnormal enhancement IMPRESSION: Given the micro embolic infarctions in both cerebral hemispheres, the most significant finding in this case is that of atherosclerosis of the aorta. There is irregular plaque, particularly notable in the descending aorta. The MRI pattern suggest micro embolic disease from the heart or ascending aorta. Atherosclerotic disease at both carotid bifurcations. 20% narrowing on the right and no narrowing on the left. Atherosclerotic disease in both carotid siphon regions but without stenosis greater than 30%. Electronically Signed   By: Nelson Chimes M.D.   On: 01/29/2016 18:07   Mr Brain Wo Contrast  Result Date: 02/02/2016 CLINICAL DATA:  New visual changes. Recent embolic infarcts. Recent discharge from hospital. Dizziness. Initial encounter. EXAM: MRI HEAD WITHOUT CONTRAST TECHNIQUE: Multiplanar, multiecho pulse sequences  of the brain and surrounding structures were obtained without intravenous contrast. COMPARISON:  MRI brain 01/29/2016. FINDINGS: Brain: Normalization or near normalization of previous areas of restricted diffusion noted on the scan from 09/13. A suspected new area of 2 mm restricted diffusion, RIGHT parietal parasagittal cortex, image 42 series 3 is noted. An area of RIGHT parietal subcortical white matter restricted diffusion is possible, but seen only on axial image 32. No hemorrhage, mass lesion, hydrocephalus, or extra-axial fluid. Advanced atrophy. Chronic microvascular ischemic change. Vascular: Normal flow voids. Skull and upper cervical spine: Normal marrow signal. Sinuses/Orbits: Negative.  BILATERAL cataract extraction. Other: None. IMPRESSION: Normalization or near normalization of previous punctate areas of acute infarction, shown on MR from 01/29/2016, with suspected new areas of RIGHT parietal cortical infarction, and RIGHT parietal subcortical white matter infarction, suggesting continued shower of emboli. See comments above. No hemorrhagic transformation, or other interval change. Stable atrophy and small vessel disease. Electronically Signed   By: Staci Righter M.D.   On: 02/02/2016 12:21   Mr Brain Wo Contrast  Result Date: 01/29/2016 CLINICAL DATA:  Headache. Left retinal artery branch occlusion. Blurred vision on the left. EXAM: MRI HEAD WITHOUT CONTRAST TECHNIQUE: Multiplanar, multiecho pulse sequences of the brain and surrounding structures were obtained without intravenous contrast. COMPARISON:  Head CT yesterday. FINDINGS: Brain: Diffusion imaging shows 2 punctate foci of acute infarction in the right occipital cortex an a punctate focus at the left parietal vertex, consistent with micro embolic infarctions from the heart or ascending aorta. No large vessel territory infarction. The brainstem and cerebellum are normal. Cerebral hemispheres otherwise show old small vessel infarctions  affecting the deep and subcortical white matter in the right basal ganglia. No mass lesion, hemorrhage, hydrocephalus or extra-axial collection. Vascular: Major vessels at the base of the brain show flow. Skull and upper cervical spine: Negative Sinuses/Orbits: Sinuses clear.  No orbital abnormality seen by MR. Other: None IMPRESSION: Scattered punctate acute infarctions consistent with micro embolic infarctions from the heart or ascending aorta. These are evident in the right occipital lobe an the left parietal vertex. Electronically Signed   By: Nelson Chimes M.D.   On: 01/29/2016 17:13   US Renal  Result Date: 01/29/2016 CLINICAL DATA:  Acute kidney injury. EXAM: RENAL / URINARY TRACT ULTRASOUND COMPLETE COMPARISON:  None. FINDINGS: Right Kidney: Length: 11.7 cm. Echogenicity within normal limits. 1.3 cm interpolar cyst. No solid mass or hydronephrosis visualized. Left Kidney: Length: 8.3 cm. Small and suboptimally visualized. No gross mass or hydronephrosis. Bladder: Appears normal for degree of bladder distention. IMPRESSION: 1. Atrophic/hypoplastic left kidney. 2. Small right renal cyst. 3. No hydronephrosis. Electronically Signed   By: Logan Bores M.D.   On: 01/29/2016 23:26   Nm Myocar Multi W/spect W/wall Motion / Ef  Result Date: 01/31/2016 CLINICAL DATA:  Chest pain, atrial fibrillation, hypertension and elevated troponin. EXAM: MYOCARDIAL IMAGING WITH SPECT (REST AND PHARMACOLOGIC-STRESS) GATED LEFT VENTRICULAR WALL MOTION STUDY LEFT VENTRICULAR EJECTION FRACTION TECHNIQUE: Standard myocardial SPECT imaging was performed after resting intravenous injection of 10 mCi Tc-16m tetrofosmin. Subsequently, intravenous infusion of Lexiscan was performed under the supervision of the Cardiology staff. At peak effect of the drug, 30 mCi Tc-110m tetrofosmin was injected intravenously and standard myocardial SPECT imaging was performed. Quantitative gated imaging was also performed to evaluate left ventricular  wall  motion, and estimate left ventricular ejection fraction. COMPARISON:  None. FINDINGS: Perfusion: No decreased activity in the left ventricle on stress imaging to suggest reversible ischemia or infarction. Wall Motion: Mild septal hypokinesis. No other wall motion abnormalities. Left Ventricular Ejection Fraction: 56 % End diastolic volume 72 ml End systolic volume 31 ml IMPRESSION: 1. No reversible ischemia or infarction. 2. Normal left ventricular wall motion. 3. Left ventricular ejection fraction 56% 4. Non invasive risk stratification*: Low *2012 Appropriate Use Criteria for Coronary Revascularization Focused Update: J Am Coll Cardiol. 5188;41(6):606-301. http://content.airportbarriers.com.aspx?articleid=[email or web address] Electronically Signed   By: Aletta Edouard M.D.   On: 01/31/2016 15:39   Nm Pulmonary Perf And Vent  Result Date: 01/30/2016 CLINICAL DATA:  Dyspnea, chest pain, onset tonight. EXAM: NUCLEAR MEDICINE VENTILATION - PERFUSION LUNG SCAN TECHNIQUE: Ventilation images were obtained in multiple projections using inhaled aerosol Tc-21m DTPA. Perfusion images were obtained in multiple projections after intravenous injection of Tc-76m MAA. RADIOPHARMACEUTICALS:  32.4 mCi Technetium-54m DTPA aerosol inhalation and 4.4 mCi Technetium-30m MAA IV COMPARISON:  Radiographs 01/29/2016 FINDINGS: Ventilation: No focal ventilation defect. Perfusion: No wedge shaped peripheral perfusion defects to suggest acute pulmonary embolism. No mismatched perfusion defects. IMPRESSION: No evidence of pulmonary embolism. Electronically Signed   By: Andreas Newport M.D.   On: 01/30/2016 00:38       Subjective: Patient chest pain free, no nausea or vomiting, no dyspnea.   Discharge Exam: Vitals:   02/28/16 0720 02/28/16 1158  BP: (!) 159/53 (!) 173/59  Pulse: 62 (!) 58  Resp: 18 16  Temp: 98.2 F (36.8 C) 98.5 F (36.9 C)   Vitals:   02/28/16 0003 02/28/16 0413 02/28/16 0720 02/28/16 1158  BP: (!)  186/93 (!) 189/69 (!) 159/53 (!) 173/59  Pulse: 63 70 62 (!) 58  Resp: 18 18 18 16   Temp: 97.7 F (36.5 C) 97.9 F (36.6 C) 98.2 F (36.8 C) 98.5 F (36.9 C)  TempSrc: Oral Oral Oral Oral  SpO2: 95% 96% 98% 97%  Weight: 68.5 kg (151 lb)     Height: 5' (1.524 m)       General: Pt is alert, awake, not in acute distress Cardiovascular: RRR, S1/S2 +, no rubs, no gallops Respiratory: CTA bilaterally, no wheezing, no rhonchi Abdominal: Soft, NT, ND, bowel sounds + Extremities: no edema, no cyanosis    The results of significant diagnostics from this hospitalization (including imaging, microbiology, ancillary and laboratory) are listed below for reference.     Microbiology: No results found for this or any previous visit (from the past 240 hour(s)).   Labs: BNP (last 3 results) No results for input(s): BNP in the last 8760 hours. Basic Metabolic Panel:  Recent Labs Lab 02/27/16 1436 02/27/16 2150  NA 137 137  K 4.5 3.6  CL 103 105  CO2 26 25  GLUCOSE 104* 132*  BUN 34* 30*  CREATININE 1.63* 1.51*  CALCIUM 9.5 9.1   Liver Function Tests: No results for input(s): AST, ALT, ALKPHOS, BILITOT, PROT, ALBUMIN in the last 168 hours. No results for input(s): LIPASE, AMYLASE in the last 168 hours. No results for input(s): AMMONIA in the last 168 hours. CBC:  Recent Labs Lab 02/27/16 1436  WBC 5.5  HGB 12.6  HCT 39.0  MCV 92.9  PLT 184   Cardiac Enzymes:  Recent Labs Lab 02/28/16 0416 02/28/16 1054  TROPONINI <0.03 <0.03   BNP: Invalid input(s): POCBNP CBG: No results for input(s): GLUCAP in the last 168 hours. D-Dimer No results for input(s): DDIMER  in the last 72 hours. Hgb A1c No results for input(s): HGBA1C in the last 72 hours. Lipid Profile No results for input(s): CHOL, HDL, LDLCALC, TRIG, CHOLHDL, LDLDIRECT in the last 72 hours. Thyroid function studies No results for input(s): TSH, T4TOTAL, T3FREE, THYROIDAB in the last 72 hours.  Invalid  input(s): FREET3 Anemia work up No results for input(s): VITAMINB12, FOLATE, FERRITIN, TIBC, IRON, RETICCTPCT in the last 72 hours. Urinalysis    Component Value Date/Time   COLORURINE YELLOW 02/27/2016 1727   APPEARANCEUR CLOUDY (A) 02/27/2016 1727   LABSPEC 1.012 02/27/2016 1727   PHURINE 7.5 02/27/2016 1727   GLUCOSEU NEGATIVE 02/27/2016 1727   HGBUR NEGATIVE 02/27/2016 1727   BILIRUBINUR NEGATIVE 02/27/2016 1727   KETONESUR NEGATIVE 02/27/2016 1727   PROTEINUR NEGATIVE 02/27/2016 1727   NITRITE NEGATIVE 02/27/2016 1727   LEUKOCYTESUR LARGE (A) 02/27/2016 1727   Sepsis Labs Invalid input(s): PROCALCITONIN,  WBC,  LACTICIDVEN Microbiology No results found for this or any previous visit (from the past 240 hour(s)).   Time coordinating discharge: 45 minutes  SIGNED:   Tawni Millers, MD  [facility] 02/28/2016, 2:55 PM Pager   If 7PM-7AM, please contact night-coverage [email or web address] Password [email or web address]

## 2016-02-28 NOTE — Discharge Instructions (Signed)

## 2016-02-28 NOTE — Progress Notes (Signed)
Orders received for pt discharge.  Discharge summary printed and reviewed with pt.  Explained medication regimen, and pt had no further questions at this time.  IV removed and site remains clean, dry, intact.  Telemetry removed.  Pt in stable condition and awaiting transport. 

## 2016-02-28 NOTE — Progress Notes (Signed)
Possibly contributing to difficult to control HTN. The left kidney is small and non functioning but still could be renin producing ([facility]blatt [facility]). Right doesn't suggest signif RAS. Would just Rx medically.  JJB

## 2016-03-04 ENCOUNTER — Ambulatory Visit (INDEPENDENT_AMBULATORY_CARE_PROVIDER_SITE_OTHER): Payer: Medicare Other | Admitting: Neurology

## 2016-03-04 ENCOUNTER — Encounter: Payer: Self-pay | Admitting: Neurology

## 2016-03-04 VITALS — Ht 60.5 in | Wt 150.6 lb

## 2016-03-04 DIAGNOSIS — I480 Paroxysmal atrial fibrillation: Secondary | ICD-10-CM | POA: Diagnosis not present

## 2016-03-04 DIAGNOSIS — H34232 Retinal artery branch occlusion, left eye: Secondary | ICD-10-CM

## 2016-03-04 DIAGNOSIS — I10 Essential (primary) hypertension: Secondary | ICD-10-CM

## 2016-03-04 DIAGNOSIS — I639 Cerebral infarction, unspecified: Secondary | ICD-10-CM

## 2016-03-04 NOTE — Progress Notes (Signed)
STROKE NEUROLOGY FOLLOW UP NOTE  NAME: Raja Caputi DOB: July 07, 1932  REASON FOR VISIT: stroke follow up HISTORY FROM: pt and chart  Today we had the pleasure of seeing Enyah Moman in follow-up at our Neurology Clinic. Pt was accompanied by no one.   History Summary       Ms. Nyanna Heideman is a 82 y.o. female with history of A. fib not on anticoagulation, HTN, HLD admitted on 01/28/16 for left visual deficit diagnosed with left branch retinal artery occlusion with left eye upper half visual field deficit. MRI showed scattered subacute punctate infarcts. CTA head and neck unremarkable except aorta athero. Renal US showed left kidney atrophy which was known in the past. EF 55-60%. LDL 97 and A1C 5.6. She had a syncope event after initiation of Xarelto, suffered traumatic SAH, so Xarelto discontinued. She was started on eliquis this time, and continued on pravastatin. She also has chest pain followed by cardiology, so ASA 81mg  was also continued.  She was discharged with ASA, eliquis and pravastatin.   Interval History During the interval time, the patient has been doing well.  She presented to ER on 02/02/16 for worsening left eye visual field deficit. However, ER exam did not show significant change from recent discharge. Had MRI again still showing scattered punctate subacute infarcts. Discharged from ER without treatment change. Followed with cardiology on 02/12/16 and did not feel ASA necessary. So ASA was discontinued. She was admitted on 02/27/16 for chest pain and ruled out for ACS and continued on eliquis and pravastatin on discharge. Had renal artery US showed right RA 1-59% stenosis and left RA absent signal which is consistent with previous findings. Her BP still fluctuate and currently on 5 BP meds. She has appointment with Dr. Gwenlyn Found next Friday. She continues to have left eye upper visual field deficit and she did have visual hallucinations with seeing cats/dogs running on the floor but  she knew that was not real. She has appointment with Dr. Rodena Piety next Wednesday. BP today on clinic 183/71 but at home 130-160.   REVIEW OF SYSTEMS: Full 14 system review of systems performed and notable only for those listed below and in HPI above, all others are negative:  Constitutional:  Activity change Cardiovascular:  Ear/Nose/Throat:   Skin:  Eyes:   Respiratory:   Gastroitestinal:   Genitourinary:  Hematology/Lymphatic:   Endocrine:  Musculoskeletal:   Allergy/Immunology:   Neurological:  HA Psychiatric:  Sleep:   The following represents the patient's updated allergies and side effects list: Allergies  Allergen Reactions  . Nickel Rash  . Sulfa Antibiotics Other (See Comments)    Reaction unknown    The neurologically relevant items on the patient's problem list were reviewed on today's visit.  Neurologic Examination  A problem focused neurological exam (12 or more points of the single system neurologic examination, vital signs counts as 1 point, cranial nerves count for 8 points) was performed.  Height 5' 0.5" (1.537 m), weight 150 lb 9.6 oz (68.3 kg).  General - Well nourished, well developed, in no apparent distress.  Ophthalmologic - Fundi not visualized due to small pupils.  Cardiovascular - Regular rate and rhythm with no murmur.  Mental Status -  Level of arousal and orientation to time, place, and person were intact. Language including expression, naming, repetition, comprehension was assessed and found intact. Fund of Knowledge was assessed and was intact.  Cranial Nerves II - XII - II - Visual field intact OD, left eye upper  field visual deficit, not able FC but able to tell HW. III, IV, VI - Extraocular movements intact. V - Facial sensation intact bilaterally. VII - Facial movement intact bilaterally. VIII - Hearing & vestibular intact bilaterally. X - Palate elevates symmetrically. XI - Chin turning & shoulder shrug intact bilaterally. XII -  Tongue protrusion intact.  Motor Strength - The patient's strength was normal in all extremities and pronator drift was absent.  Bulk was normal and fasciculations were absent.   Motor Tone - Muscle tone was assessed at the neck and appendages and was normal.  Reflexes - The patient's reflexes were 1+ in all extremities and she had no pathological reflexes.  Sensory - Light touch, temperature/pinprick were assessed and were normal.    Coordination - The patient had normal movements in the hands and feet with no ataxia or dysmetria.  Tremor was absent.  Gait and Station - The patient's transfers, posture, gait, station, and turns were observed as normal.   Functional score  mRS = 2   0 - No symptoms.   1 - No significant disability. Able to carry out all usual activities, despite some symptoms.   2 - Slight disability. Able to look after own affairs without assistance, but unable to carry out all previous activities.   3 - Moderate disability. Requires some help, but able to walk unassisted.   4 - Moderately severe disability. Unable to attend to own bodily needs without assistance, and unable to walk unassisted.   5 - Severe disability. Requires constant nursing care and attention, bedridden, incontinent.   6 - Dead.   NIH Stroke Scale   Level Of Consciousness 0=Alert; keenly responsive 1=Not alert, but arousable by minor stimulation 2=Not alert, requires repeated stimulation 3=Responds only with reflex movements 0  LOC Questions to Month and Age 73=Answers both questions correctly 1=Answers one question correctly 2=Answers neither question correctly 0  LOC Commands      -Open/Close eyes     -Open/close grip 0=Performs both tasks correctly 1=Performs one task correctly 2=Performs neighter task correctly 0  Best Gaze 0=Normal 1=Partial gaze palsy 2=Forced deviation, or total gaze paresis 0  Visual 0=No visual loss 1=Partial hemianopia 2=Complete  hemianopia 3=Bilateral hemianopia (blind including cortical blindness) 1  Facial Palsy 0=Normal symmetrical movement 1=Minor paralysis (asymmetry) 2=Partial paralysis (lower face) 3=Complete paralysis (upper and lower face) 0  Motor  0=No drift, limb holds posture for full 10 seconds 1=Drift, limb holds posture, no drift to bed 2=Some antigravity effort, cannot maintain posture, drifts to bed 3=No effort against gravity, limb falls 4=No movement Right Arm 0     Leg 0    Left Arm 0     Leg 0  Limb Ataxia 0=Absent 1=Present in one limb 2=Present in two limbs 0  Sensory 0=Normal 1=Mild to moderate sensory loss 2=Severe to total sensory loss 0  Best Language 0=No aphasia, normal 1=Mild to moderate aphasia 2=Mute, global aphasia 3=Mute, global aphasia 0  Dysarthria 0=Normal 1=Mild to moderate 2=Severe, unintelligible or mute/anarthric 0  Extinction/Neglect 0=No abnormality 1=Extinction to bilateral simultaneous stimulation 2=Profound neglect 0  Total   1     Data reviewed: I personally reviewed the images and agree with the radiology interpretations.  Ct Head Wo Contrast 01/28/2016 IMPRESSION: 1. No acute intracranial abnormalities. 2. Chronic microvascular disease and brain atrophy.   Dg Chest Port 1 View 01/28/2016 IMPRESSION: Borderline cardiomegaly with atherosclerosis of the thoracic aorta. Mild bibasilar atelectasis.  CTA Head and Neck  01/29/2016 Given  the micro embolic infarctions in both cerebral hemispheres, the most significant finding in this case is that of atherosclerosis of the aorta. There is irregular plaque, particularly notable in the descending aorta. The MRI pattern suggest micro embolic disease from the heart or ascending aorta. Atherosclerotic disease at both carotid bifurcations. 20% narrowing on the right and no narrowing on the left. Atherosclerotic disease in both carotid siphon regions but without stenosis greater than 30%.  MRI  brain 01/29/2016 Scattered punctate acute infarctions consistent with micro embolic infarctions from the heart or ascending aorta. These are evident in the right occipital lobe an the left parietal vertex. Likely subacute events.  Renal US 01/29/16 1. Atrophic/hypoplastic left kidney. 2. Small right renal cyst. 3. No hydronephrosis.  2D Echocardiogram   - Left ventricle: The cavity size was normal. Wall thickness was normal. Systolic function was normal. The estimated ejection fraction was in the range of 55% to 60%. Wall motion was normal; there were no regional wall motion abnormalities. Doppler parameters are consistent with abnormal left ventricular relaxation (grade 1 diastolic dysfunction). - Mitral valve: Calcified annulus. Impressions: - No cardiac source of emboli was indentified.  EKG  junctional rhythm  Renal artery [place] 02/26/16 - right RA 1-59% stenosis - left RA absence of signal.    Component     Latest Ref Rng & Units 01/29/2016  Cholesterol     0 - 200 mg/dL 174  Triglycerides     <150 mg/dL 180 (H)  HDL Cholesterol     >40 mg/dL 41  Total CHOL/HDL Ratio     RATIO 4.2  VLDL     0 - 40 mg/dL 36  LDL (calc)     0 - 99 mg/dL 97  Hemoglobin A1C     4.8 - 5.6 % 5.6  Mean Plasma Glucose     mg/dL 114    Assessment: As you may recall, she is a 82 y.o. Caucasian female with PMH of A. fib not on anticoagulation, HTN, HLD admitted on 01/28/16 for left branch retinal artery occlusion with left eye upper half visual field deficit. MRI showed scattered subacute punctate infarcts. CTA head and neck unremarkable except aorta athero. Renal US showed left kidney atrophy which was known in the past. EF 55-60%. LDL 97 and A1C 5.6. She had a syncope event after initiation of Xarelto, suffered traumatic SAH, so Xarelto discontinued. She also has chest pain followed by cardiology, so ASA 81mg  was continued.  She was discharged with ASA, eliquis and pravastatin. During  the interval time, she followed with cardiology on 02/12/16 and did not feel ASA necessary. So ASA was discontinued. She was admitted on 02/27/16 for chest pain and ruled out for ACS and continued on eliquis and pravastatin on discharge. Had renal artery US showed right RA 1-59% stenosis and left RA absent signal which is consistent with previous findings. She continues to have left eye upper visual field deficit and she did have visual hallucinations with seeing cats/dogs running on the floor but she knew that was not real. 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 She has appointment with Dr. Rodena Piety next Wednesday.    Plan:  - continue eliquis and pravastatin for stroke prevention. - check BP at home and record - follow up with cardiology Dr. Gwenlyn Found for BP and afib - follow up with eye doctor Dr. Zigmund Daniel for vision deficit and visual hallucinations.  - Follow up with your primary care physician for stroke risk factor modification. Recommend maintain blood pressure goal <140/80,  diabetes with hemoglobin A1c goal below 7.0% and lipids with LDL cholesterol goal below 70 mg/dL.  - relaxation, healthy diet and regular exercise.  - follow up in 3 months.   I spent more than 25 minutes of face to face time with the patient. Greater than 50% of time was spent in counseling and coordination of care. We discussed visual hallucinations, BP control and reviewed renal artery ultrasound results.    No orders of the defined types were placed in this encounter.   Meds ordered this encounter  Medications  . DISCONTD: Multiple Vitamin (MULTIVITAMIN) tablet    Sig: Take by mouth.    Patient Instructions  - continue eliquis and pravastatin for stroke prevention - check BP at home and record - follow up with cardiology Dr. Gwenlyn Found for BP and afib - follow up with eye doctor Dr. Zigmund Daniel for vision deficit and visual hallucinations.  - Follow up with your primary care physician for stroke risk factor modification.  Recommend maintain blood pressure goal <140/80, diabetes with hemoglobin A1c goal below 7.0% and lipids with LDL cholesterol goal below 70 mg/dL.  - relaxation and continue doing things you are interested.  - follow up in 3 months.      Rosalin Hawking, MD PhD [facility] [address] [place], [place] [address] [phone]

## 2016-03-04 NOTE — Patient Instructions (Signed)
-   continue eliquis and pravastatin for stroke prevention - check BP at home and record - follow up with cardiology Dr. Gwenlyn Found for BP and afib - follow up with eye doctor Dr. Zigmund Daniel for vision deficit and visual hallucinations.  - Follow up with your primary care physician for stroke risk factor modification. Recommend maintain blood pressure goal <140/80, diabetes with hemoglobin A1c goal below 7.0% and lipids with LDL cholesterol goal below 70 mg/dL.  - relaxation and continue doing things you are interested.  - follow up in 3 months.

## 2016-03-09 ENCOUNTER — Ambulatory Visit (INDEPENDENT_AMBULATORY_CARE_PROVIDER_SITE_OTHER): Payer: Medicare Other | Admitting: Pharmacist

## 2016-03-09 VITALS — BP 172/86 | HR 61

## 2016-03-09 DIAGNOSIS — I10 Essential (primary) hypertension: Secondary | ICD-10-CM

## 2016-03-09 MED ORDER — HYDRALAZINE HCL 25 MG PO TABS: ORAL_TABLET | ORAL | 6 refills | Status: DC

## 2016-03-09 NOTE — Progress Notes (Signed)
[name]                 DOB: 05-21-1932                      MRN: [id number]     HPI: Raiyah Speakman is a 82 y.o. female patient of Dr. Gwenlyn Found with [facility] who presents today for hypertension evaluation. She was recently started on hydralazine 25mg  BID.   She reports that taking amlodipine BID has helped to control her pressures throughout the day.   She reports no dizziness or new symptoms associated with recent medication changes.   She is scheduled to see Kerin Ransom, PA on Friday.   Cardiac Hx: PAF, cardioembolic stroke, HTN, HLD, CKD  Current HTN meds:  Amlodipine 5mg  BID Atenolol-chlorthalidone 100-25mg  QAM Hydralazine 25mg  BID Losartan 100mg  daily  BP goal: <150/90  Social History: She quit smoking 45 years ago. She denies smokeless tobacco. She endorses 1/2 glass of wine daily.   Diet: She eats about 1/2 her meals out. She prepares her food when eating from home. She does not add salt to foods. She endorses 1-2 cups of coffee per day and no other caffeinated beverages.   Exercise: She walks 5-10 minutes about 1 time per week. She is trying to build on this.   Home BP readings:  She brings a log with her today which show 2 measurements >390 systolic (300 and 923). Otherwise all of her measurements are <140. Diastolic avg 30-07M.   Wt Readings from Last 3 Encounters:  03/04/16 150 lb 9.6 oz (68.3 kg)  02/28/16 151 lb (68.5 kg)  02/12/16 148 lb 12.8 oz (67.5 kg)   BP Readings from Last 3 Encounters:  03/09/16 (!) 172/86  02/28/16 (!) 173/59  02/12/16 (!) 195/78   Pulse Readings from Last 3 Encounters:  03/09/16 61  02/28/16 (!) 58  02/12/16 60    Renal function: Estimated Creatinine Clearance: 25.9 mL/min (by C-G formula based on SCr of 1.51 mg/dL (H)).  Past Medical History:  Diagnosis Date  . Atrial fibrillation ([place])   . Hyperlipidemia   . Hypertension   . Small kidney    left  . Stroke [facility])     Current Outpatient Prescriptions  on File Prior to Visit  Medication Sig Dispense Refill  . amLODipine (NORVASC) 5 MG tablet Take 2 tablets (10 mg total) by mouth every morning. (Patient taking differently: Take 5 mg by mouth 2 (two) times daily. ) 30 tablet 0  . apixaban (ELIQUIS) 5 MG TABS tablet Take 1 tablet (5 mg total) by mouth 2 (two) times daily. 180 tablet 3  . atenolol-chlorthalidone (TENORETIC) 100-25 MG tablet Take 1 tablet by mouth every morning.    . calcium carbonate (OS-CAL) 600 MG TABS tablet Take 600 mg by mouth 2 (two) times daily.    [name] losartan (COZAAR) 100 MG tablet Take 100 mg by mouth daily.    . Magnesium Gluconate (MAGNESIUM 27) 500 (27 Mg) MG TABS Take 500 mg by mouth daily.    . Multiple Vitamin (MULTIVITAMIN) tablet Take 1 tablet by mouth daily.    . pravastatin (PRAVACHOL) 40 MG tablet Take 40 mg by mouth every evening.    . vitamin C (ASCORBIC ACID) 500 MG tablet Take 500 mg by mouth daily.     No current facility-administered medications on file prior to visit.     Allergies  Allergen Reactions  . Nickel Rash  . Sulfa Antibiotics  Other (See Comments)    Reaction unknown    Blood pressure (!) 172/86, pulse 61, SpO2 96 %.   Assessment/Plan: Hypertension: BP is not at goal. I question the accuracy of her home cuff as her pressures have been consistently higher in office/hospital and she reports the cuff being older. Have asked her to bring cuff to be verified at next pharmacy visit. She states she will have her local pharmacist verify the cuff. If her cuff measures appropriately she may have white coat hypertension. Have increased her hydralazine to 25mg  TID. She will follow up with Kerin Ransom, [place] on Friday as scheduled and discuss renal dopplers as previously noted. Follow up in hypertension clinic after that visit if further titration of medications is needed.    Thank you, Lelan Pons. [place], [place]

## 2016-03-09 NOTE — Patient Instructions (Addendum)
Return for a follow up appointment as scheduled with Kerin Ransom, PA.   Check your blood pressure at home daily (if able) and keep record of the readings.  Take your BP meds as follows: INCREASE your hydralazine to 25mg  three times a day   PLEASE bring YOUR CUFF to be evaluated.   Bring all of your meds, your BP cuff and your record of home blood pressures to your next appointment.  Exercise as you're able, try to walk approximately 30 minutes per day.  Keep salt intake to a minimum, especially watch canned and prepared boxed foods.  Eat more fresh fruits and vegetables and fewer canned items.  Avoid eating in fast food restaurants.    HOW TO TAKE YOUR BLOOD PRESSURE: . Rest 5 minutes before taking your blood pressure. .  Don't smoke or drink caffeinated beverages for at least 30 minutes before. . Take your blood pressure before (not after) you eat. . Sit comfortably with your back supported and both feet on the floor (don't cross your legs). . Elevate your arm to heart level on a table or a desk. . Use the proper sized cuff. It should fit smoothly and snugly around your bare upper arm. There should be enough room to slip a fingertip under the cuff. The bottom edge of the cuff should be 1 inch above the crease of the elbow. . Ideally, take 3 measurements at one sitting and record the average.

## 2016-03-10 ENCOUNTER — Encounter: Payer: Self-pay | Admitting: Pharmacist

## 2016-03-11 ENCOUNTER — Encounter (INDEPENDENT_AMBULATORY_CARE_PROVIDER_SITE_OTHER): Payer: Medicare Other | Admitting: Ophthalmology

## 2016-03-11 DIAGNOSIS — H35033 Hypertensive retinopathy, bilateral: Secondary | ICD-10-CM

## 2016-03-11 DIAGNOSIS — I10 Essential (primary) hypertension: Secondary | ICD-10-CM | POA: Diagnosis not present

## 2016-03-11 DIAGNOSIS — H34232 Retinal artery branch occlusion, left eye: Secondary | ICD-10-CM

## 2016-03-11 DIAGNOSIS — H43813 Vitreous degeneration, bilateral: Secondary | ICD-10-CM | POA: Diagnosis not present

## 2016-03-13 ENCOUNTER — Ambulatory Visit (INDEPENDENT_AMBULATORY_CARE_PROVIDER_SITE_OTHER): Payer: Medicare Other | Admitting: Cardiology

## 2016-03-13 ENCOUNTER — Encounter: Payer: Self-pay | Admitting: Cardiology

## 2016-03-13 DIAGNOSIS — I10 Essential (primary) hypertension: Secondary | ICD-10-CM | POA: Diagnosis not present

## 2016-03-13 DIAGNOSIS — N182 Chronic kidney disease, stage 2 (mild): Secondary | ICD-10-CM

## 2016-03-13 DIAGNOSIS — Z8679 Personal history of other diseases of the circulatory system: Secondary | ICD-10-CM

## 2016-03-13 DIAGNOSIS — I480 Paroxysmal atrial fibrillation: Secondary | ICD-10-CM | POA: Diagnosis not present

## 2016-03-13 DIAGNOSIS — I639 Cerebral infarction, unspecified: Secondary | ICD-10-CM

## 2016-03-13 DIAGNOSIS — Z905 Acquired absence of kidney: Secondary | ICD-10-CM | POA: Insufficient documentation

## 2016-03-13 DIAGNOSIS — N289 Disorder of kidney and ureter, unspecified: Secondary | ICD-10-CM

## 2016-03-13 DIAGNOSIS — R072 Precordial pain: Secondary | ICD-10-CM

## 2016-03-13 DIAGNOSIS — N189 Chronic kidney disease, unspecified: Secondary | ICD-10-CM

## 2016-03-13 MED ORDER — HYDRALAZINE HCL 25 MG PO TABS: 25.0000 mg | ORAL_TABLET | Freq: Three times a day (TID) | ORAL | 2 refills | Status: DC

## 2016-03-13 MED ORDER — HYDRALAZINE HCL 50 MG PO TABS: 50.0000 mg | ORAL_TABLET | Freq: Three times a day (TID) | ORAL | 2 refills | Status: DC

## 2016-03-13 NOTE — Assessment & Plan Note (Signed)
On statin Rx 

## 2016-03-13 NOTE — Assessment & Plan Note (Signed)
Bump in SCr to 1.6 in Oct 2017

## 2016-03-13 NOTE — Progress Notes (Signed)
03/13/2016 Valerie Terrell   Mar 26, 1933  [id number]  Primary Physician Amador Cunas, [place] Primary Cardiologist: Dr Gwenlyn Found  HPI:  82 y/o female with a history of PAF. She had been placed on Xarelto in the past but apparently had what sounds like orthostatic syncope and suffered a "tiny" SAH and Xarelto was discontinued and she as placed on ASA.. She presented 01/30/16 with a head ache and blurred vision in her OS. She had a retinal artery occlusion and evidence of bilateral micro emboli on MRI c/w embolic stroke. After consultation with the Neurologist she was placed on Eliquis 5 mg BID and later Dr Gwenlyn Found stopped her ASA. During that hospitalization she had also complained of chest pain and had a slightly elevated Troponin. Echo and Myoview were normal. When she was seen in follow up 02/12/16 she was noted to have an elevated B/P. Hydralazine was added. Hospital records from Sept showed a "hypoplastic Lt kidney" and we had her get renal artery dopplers as an OP. This revealed 1-59% Rt RAS and no flow in the Lt renal artery. This was reviewed by Dr Gwenlyn Found who felt that the Rt RA was not significantly stenosed and that the Lt renal artery was small and and non functioning but still could be rennin producing ([place] kidney).   The pt then presented to [facility] ED 02/28/16 with localized chest pain. Her B/P was noted to be elelvated and her SCr was 1.6. Review of her renal function show a SCr of 1.0-1.4 in Sept 2017. Her chest pain was felt to be atypical for angina. She is in the office today for follow up. Her B/P at home is pretty well controlled by her readings but it was 170/ 72 here in the office (re checked by me).    Current Outpatient Prescriptions  Medication Sig Dispense Refill  . amLODipine (NORVASC) 5 MG tablet Take 1 tablet by mouth 2 (two) times daily with breakfast and lunch.    [name] apixaban (ELIQUIS) 5 MG TABS tablet Take 1 tablet (5 mg total) by mouth 2 (two) times daily. 180 tablet 3  .  atenolol-chlorthalidone (TENORETIC) 100-25 MG tablet Take 1 tablet by mouth every morning.    . calcium carbonate (OS-CAL) 600 MG TABS tablet Take 600 mg by mouth 2 (two) times daily.    . hydrALAZINE (APRESOLINE) 25 MG tablet Take 25 mg three times daily 90 tablet 6  . losartan (COZAAR) 100 MG tablet Take 100 mg by mouth daily.    . Magnesium Gluconate (MAGNESIUM 27) 500 (27 Mg) MG TABS Take 500 mg by mouth daily.    . Multiple Vitamin (MULTIVITAMIN) tablet Take 1 tablet by mouth daily.    . pravastatin (PRAVACHOL) 40 MG tablet Take 40 mg by mouth every evening.    . vitamin C (ASCORBIC ACID) 500 MG tablet Take 500 mg by mouth daily.     No current facility-administered medications for this visit.     Allergies  Allergen Reactions  . Nickel Rash  . Sulfa Antibiotics Other (See Comments)    Reaction unknown    Social History   Social History  . Marital status: Widowed    Spouse name: N/A  . Number of children: N/A  . Years of education: N/A   Occupational History  . Not on file.   Social History Main Topics  . Smoking status: Never Smoker  . Smokeless tobacco: Never Used  . Alcohol use 0.6 oz/week    1 Glasses of wine per week  Comment: wine  . Drug use: No  . Sexual activity: No   Other Topics Concern  . Not on file   Social History Narrative  . No narrative on file     Review of Systems: General: negative for chills, fever, night sweats or weight changes.  Cardiovascular: negative for chest pain, dyspnea on exertion, edema, orthopnea, palpitations, paroxysmal nocturnal dyspnea or shortness of breath Dermatological: negative for rash Respiratory: negative for cough or wheezing Urologic: negative for hematuria Abdominal: negative for nausea, vomiting, diarrhea, bright red blood per rectum, melena, or hematemesis Neurologic: negative for visual changes, syncope, or dizziness All other systems reviewed and are otherwise negative except as noted  above.    Blood pressure (!) 170/72, pulse (!) 59, height 5' 0.5" (1.537 m), weight 149 lb 3.2 oz (67.7 kg).  General appearance: alert, cooperative and no distress Neck: no JVD Lungs: clear to auscultation bilaterally Heart: regular rate and rhythm Extremities: extremities normal, atraumatic, no cyanosis or edema Skin: Skin color, texture, turgor normal. No rashes or lesions Neurologic: Grossly normal   ASSESSMENT AND PLAN:   Essential hypertension Poor control  Cardioembolic stroke (HCC) Embolic stroke (retinal artery occlusion and embolic bilateral micro emboli on MRI) 01/30/16  PAF (paroxysmal atrial fibrillation) (HCC) CHADs VASc= 5. Holding NSR  Personal history of subarachnoid hemorrhage Xarelto stopped in the past but Eliquis started after retinal artery emboli Sep 2017  Chest pain Atypical for angina. Low risk Myoview Sept 2017, ED visit Oct 2017-MI r/o  Acute on chronic renal insufficiency Bump in SCr to 1.6 in Oct 2017  Dyslipidemia On statin Rx  CKD (chronic kidney disease), stage II .  Single kidney Non functioning Lt kidney, no RAS on the Rt by doppler   PLAN  I reviewed her case with Dr Gwenlyn Found in the office. For now will increase her Hydralazine to 75 mg TID (she is currently on 50 mg TID). I'll see her back in two weeks for B/P check and repeat SCr. If her SCr continues to rise or her B/P is uncontrolled will discuss possible renal artery angiogram with Dr Gwenlyn Found.  Kerin Ransom PA-C 03/13/2016 11:33 AM

## 2016-03-13 NOTE — Assessment & Plan Note (Signed)
Atypical for angina. Low risk Myoview Sept 2017, ED visit Oct 2017-MI r/o

## 2016-03-13 NOTE — Assessment & Plan Note (Signed)
Embolic stroke (retinal artery occlusion and embolic bilateral micro emboli on MRI) 01/30/16

## 2016-03-13 NOTE — Assessment & Plan Note (Signed)
Xarelto stopped in the past but Eliquis started after retinal artery emboli Sep 2017

## 2016-03-13 NOTE — Assessment & Plan Note (Signed)
CHADs VASc= 5. Holding NSR

## 2016-03-13 NOTE — Assessment & Plan Note (Signed)
Non functioning Lt kidney, no RAS on the Rt by doppler

## 2016-03-13 NOTE — Assessment & Plan Note (Signed)
Poor control

## 2016-03-13 NOTE — Patient Instructions (Addendum)
Medication Instructions:  INCREASE Hydralazine to 75mg  Take 1 (50mg  tablet along AND 1(one) 25mg  tablet TOGETHER)  three times a day  Labwork: Your physician recommends that you return for lab work in: 3 days before appt with [place]  Testing/Procedures: None   Follow-Up: Your physician recommends that you schedule a follow-up appointment in: 2 WEEKS with Kerin Ransom, PA  Any Other Special Instructions Will Be Listed Below (If Applicable).     If you need a refill on your cardiac medications before your next appointment, please call your pharmacy.

## 2016-03-23 ENCOUNTER — Encounter: Payer: Self-pay | Admitting: Family Medicine

## 2016-03-23 ENCOUNTER — Ambulatory Visit (INDEPENDENT_AMBULATORY_CARE_PROVIDER_SITE_OTHER): Payer: Medicare Other | Admitting: Family Medicine

## 2016-03-23 VITALS — BP 186/69 | HR 68 | Temp 99.4°F | Resp 18 | Ht 59.5 in | Wt 149.1 lb

## 2016-03-23 DIAGNOSIS — H34232 Retinal artery branch occlusion, left eye: Secondary | ICD-10-CM

## 2016-03-23 DIAGNOSIS — Z8673 Personal history of transient ischemic attack (TIA), and cerebral infarction without residual deficits: Secondary | ICD-10-CM | POA: Diagnosis not present

## 2016-03-23 DIAGNOSIS — I10 Essential (primary) hypertension: Secondary | ICD-10-CM

## 2016-03-23 DIAGNOSIS — E782 Mixed hyperlipidemia: Secondary | ICD-10-CM

## 2016-03-23 DIAGNOSIS — I480 Paroxysmal atrial fibrillation: Secondary | ICD-10-CM

## 2016-03-23 LAB — TSH: TSH: 2.57 u[IU]/mL (ref 0.35–4.50)

## 2016-03-23 NOTE — Progress Notes (Signed)
Office Note 03/23/2016  CC:  Chief Complaint  Patient presents with  . Establish Care   HPI:  Valerie Terrell is a 82 y.o. female who is here to establish care. Patient's most recent primary MD: none Old records in [facility] EMR were reviewed prior to or during today's visit.  She is not fasting today. She has been getting bp meds titrated, hydralazine is latest and as she has gone up on this she has felt more lethargy/amotivational.  She says "I'm fighting through it, though".  Home bp monitoring avg 130s/60s.  HR 60s. Cardiology has close f/u with her and has ordered a BMP through [facility] that we'll draw here today.  Reviewed recent neuro and cardiology f/u notes in EMR.   Past Medical History:  Diagnosis Date  . Atrophy of left kidney   . 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 Hyperlipidemia   . Hypertension   . PAF (paroxysmal atrial fibrillation) ([place])   . Stroke [facility])    cardioembolic (had CVA while on no anticoag)--"scattered subacute punctate infarcts: 1 in R parietal lobe and 2 in occipital cortex" on MRI br.  CT angio head/neck: aortic arch athero    Past Surgical History:  Procedure Laterality Date  . APPENDECTOMY  child  . CARDIOVASCULAR STRESS TEST  01/31/2016   Stress myoview: NORMAL/Low risk  . CESAREAN SECTION    . [place]  . COLONOSCOPY  2015  . TONSILLECTOMY    . TRANSTHORACIC ECHOCARDIOGRAM  01/29/2016   EF 55-60%, normal LV wall motion, grade I DD.  No cardiac source of emboli was seen.    Family History  Problem Relation Age of Onset  . Heart disease Father   . Early death Father   . Sudden Cardiac Death Neg Hx     Social History   Social History  . Marital status: Widowed    Spouse name: N/A  . Number of children: N/A  . Years of education: N/A   Occupational History  . Not on file.   Social History Main Topics  . Smoking status: Never Smoker  . Smokeless tobacco: Never Used  . Alcohol use 0.6 oz/week   1 Glasses of wine per week     Comment: wine  . Drug use: No  . Sexual activity: No   Other Topics Concern  . Not on file   Social History Narrative   Widow.  One daughter, lives with her.   Educ: college   Occup: retired Marine scientist.   No T/A/Ds.   She is almost a vegetarian.    Outpatient Encounter Prescriptions as of 03/23/2016  Medication Sig  . amLODipine (NORVASC) 5 MG tablet Take 1 tablet by mouth 2 (two) times daily with breakfast and lunch.  [name] apixaban (ELIQUIS) 5 MG TABS tablet Take 1 tablet (5 mg total) by mouth 2 (two) times daily.  [name] atenolol-chlorthalidone (TENORETIC) 100-25 MG tablet Take 1 tablet by mouth every morning.  . calcium carbonate (OS-CAL) 600 MG TABS tablet Take 600 mg by mouth 2 (two) times daily.  . hydrALAZINE (APRESOLINE) 25 MG tablet Take 1 tablet (25 mg total) by mouth 3 (three) times daily. Take together with 50 mg tab  . hydrALAZINE (APRESOLINE) 50 MG tablet Take 1 tablet (50 mg total) by mouth 3 (three) times daily. Take together with 25mg  tab  . losartan (COZAAR) 100 MG tablet Take 100 mg by mouth daily.  . Magnesium Gluconate (MAGNESIUM 27) 500 (27 Mg) MG TABS  Take 500 mg by mouth daily.  . Multiple Vitamin (MULTIVITAMIN) tablet Take 1 tablet by mouth daily.  . pravastatin (PRAVACHOL) 40 MG tablet Take 40 mg by mouth every evening.  . vitamin C (ASCORBIC ACID) 500 MG tablet Take 500 mg by mouth daily.   No facility-administered encounter medications on file as of 03/23/2016.     Allergies  Allergen Reactions  . Nickel Rash  . Sulfa Antibiotics Other (See Comments)    Reaction unknown    ROS Review of Systems  Constitutional: Positive for fatigue. Negative for fever.  HENT: Negative for congestion and sore throat.   Respiratory: Negative for cough.   Cardiovascular: Negative for chest pain.  Gastrointestinal: Negative for abdominal pain and nausea.  Genitourinary: Negative for dysuria.  Musculoskeletal: Negative for back pain and joint  swelling.  Skin: Negative for rash.  Neurological: Negative for weakness and headaches.  Hematological: Negative for adenopathy.    PE; Blood pressure (!) 186/69, pulse 68, temperature 99.4 F (37.4 C), temperature source Temporal, resp. rate 18, height 4' 11.5" (1.511 m), weight 149 lb 1.9 oz (67.6 kg), SpO2 95 %. Gen: Alert, well appearing.  Patient is oriented to person, place, time, and situation. AFFECT: pleasant, lucid thought and speech. CV: RRR, no m/r/g.   LUNGS: CTA bilat, nonlabored resps, good aeration in all lung fields. EXT: no clubbing or cyanosis.  1+ pitting edema bilat LLs. Pertinent labs:   Lab Results  Component Value Date   WBC 5.5 02/27/2016   HGB 12.6 02/27/2016   HCT 39.0 02/27/2016   MCV 92.9 02/27/2016   PLT 184 02/27/2016   Lab Results  Component Value Date   CREATININE 1.51 (H) 02/27/2016   BUN 30 (H) 02/27/2016   NA 137 02/27/2016   K 3.6 02/27/2016   CL 105 02/27/2016   CO2 25 02/27/2016   Lab Results  Component Value Date   ALT 28 02/02/2016   AST 42 (H) 02/02/2016   ALKPHOS 39 02/02/2016   BILITOT 0.8 02/02/2016   Lab Results  Component Value Date   CHOL 174 01/29/2016   Lab Results  Component Value Date   HDL 41 01/29/2016   Lab Results  Component Value Date   LDLCALC 97 01/29/2016   Lab Results  Component Value Date   TRIG 180 (H) 01/29/2016   Lab Results  Component Value Date   CHOLHDL 4.2 01/29/2016   Lab Results  Component Value Date   HGBA1C 5.6 01/29/2016    ASSESSMENT AND PLAN:   New pt; obtain PCP and GI MD records.  1) HTN: The current medical regimen is effective;  continue present plan and medications. BMET drawn as per pt's cardiologist's orders today. If pt's Cr continues to creep up they have plan to get renal artery angiogram.  2) PAF: regular rhythm on exam today. Continue eliquis. Check TSH.  3) Hx of CVA: punctate on MRI.  W/u revealed aortic atherosclerosis as most likely source of  microemboli. Risk factor mod: LDL goal <70, bp goal <130/80. Cont eliquis.  Keep neuro f/u.  4) Branch retinal artery occlusion, left eye: cont f/u with Dr. Zigmund Daniel.  Cont eliquis.  5) Hyperlipidemia: tolerating statin.  At next f/u in 3 mo will recheck FLP. Most recent LDL was 97 two months ago, goal <70.  6) Hx of adenomatous colon polyps: need to get GI records to see when next colonoscopy is due.  An After Visit Summary was printed and given to the patient.  Return  in about 3 months (around 06/23/2016) for annual CPE (fasting).  Signed:  Crissie Sickles, MD           03/23/2016

## 2016-03-24 ENCOUNTER — Encounter: Payer: Self-pay | Admitting: Cardiology

## 2016-03-24 LAB — BASIC METABOLIC PANEL
BUN: 32 mg/dL — ABNORMAL HIGH (ref 7–25)
CO2: 26 mmol/L (ref 20–31)
Calcium: 9.7 mg/dL (ref 8.6–10.4)
Chloride: 102 mmol/L (ref 98–110)
Creat: 1.63 mg/dL — ABNORMAL HIGH (ref 0.60–0.88)
Glucose, Bld: 99 mg/dL (ref 65–99)
Potassium: 4.5 mmol/L (ref 3.5–5.3)
Sodium: 138 mmol/L (ref 135–146)

## 2016-03-31 ENCOUNTER — Ambulatory Visit (INDEPENDENT_AMBULATORY_CARE_PROVIDER_SITE_OTHER): Payer: Medicare Other | Admitting: Cardiology

## 2016-03-31 ENCOUNTER — Encounter: Payer: Self-pay | Admitting: Cardiology

## 2016-03-31 VITALS — BP 146/82 | HR 64 | Ht 60.0 in | Wt 150.2 lb

## 2016-03-31 DIAGNOSIS — I10 Essential (primary) hypertension: Secondary | ICD-10-CM

## 2016-03-31 DIAGNOSIS — H34232 Retinal artery branch occlusion, left eye: Secondary | ICD-10-CM

## 2016-03-31 DIAGNOSIS — Z905 Acquired absence of kidney: Secondary | ICD-10-CM

## 2016-03-31 DIAGNOSIS — I639 Cerebral infarction, unspecified: Secondary | ICD-10-CM

## 2016-03-31 DIAGNOSIS — I480 Paroxysmal atrial fibrillation: Secondary | ICD-10-CM | POA: Diagnosis not present

## 2016-03-31 DIAGNOSIS — N182 Chronic kidney disease, stage 2 (mild): Secondary | ICD-10-CM | POA: Diagnosis not present

## 2016-03-31 DIAGNOSIS — N289 Disorder of kidney and ureter, unspecified: Secondary | ICD-10-CM

## 2016-03-31 DIAGNOSIS — N189 Chronic kidney disease, unspecified: Secondary | ICD-10-CM

## 2016-03-31 NOTE — Patient Instructions (Signed)
Medication Instructions:  Your physician recommends that you continue on your current medications as directed. Please refer to the Current Medication list given to you today.  Labwork: NONE   Testing/Procedures: NONE  Follow-Up: Your physician recommends that you schedule a follow-up appointment in: [place].  Any Other Special Instructions Will Be Listed Below (If Applicable).     If you need a refill on your cardiac medications before your next appointment, please call your pharmacy.

## 2016-03-31 NOTE — Progress Notes (Signed)
03/31/2016 Valerie Terrell   [birth year]  [id number]  Primary Physician Tammi Sou, MD Primary Cardiologist: Dr Gwenlyn Found  HPI:  82 y/o female with a history of PAF. She had been placed on Xarelto in the past but apparently had what sounds like orthostatic syncope and suffered a "tiny" SAH and Xarelto was discontinued and she as placed on ASA.. She presented 01/30/16 with a head ache and blurred vision in her OS. She had a retinal artery occlusion and evidence of bilateral micro emboli on MRI c/w embolic stroke. After consultation with the Neurologist she was placed on Eliquis 5 mg BID and later Dr Gwenlyn Found stopped her ASA. During that hospitalization she had also complained of chest pain and had a slightly elevated Troponin. Echo and Myoview were normal. When she was seen in follow up 02/12/16 she was noted to have an elevated B/P. Hydralazine was added. Hospital records from Sept showed a "hypoplastic Lt kidney" and we had her get renal artery dopplers as an OP. This revealed 1-59% Rt RAS and no flow in the Lt renal artery. This was reviewed by Dr Gwenlyn Found who felt that the Rt RA was not significantly stenosed and that the Lt renal artery was small and and non functioning but still could be rennin producing (Goldblatt kidney). Her renal to aorta ratio was 3.22 with 3.5 being the cutoff for renal artery stenosis.   The pt then presented to [facility] ED 02/28/16 with localized chest pain. Her B/P was noted to be elelvated and her SCr was 1.6. Review of her renal function show a SCr of 1.0-1.4 in Sept 2017. Her chest pain was felt to be atypical for angina. She was seen in the office 03/13/16 for follow up. Her b/p was noted to be 833 systolic. Her renal artery dopplers were again reviewed with Dr Gwenlyn Found and he does not feel she has significant RAS.   Her Hydralazine was adjusted and she is here now with her daughter for follow up. Since we adjusted her Hydralazine her B/P has come under better control with  readings at home running [id number] systolic. A BMP done at Dr Delanna Ahmadi office 11/7 showed a SCr of 1.63. The pt has multiple complaints she attributes to the Hydralazine including fatigue which is the most bothersome. She also says she has developed chills and is cold all the time.    Current Outpatient Prescriptions  Medication Sig Dispense Refill  . amLODipine (NORVASC) 5 MG tablet Take 1 tablet by mouth 2 (two) times daily with breakfast and lunch.    [name] apixaban (ELIQUIS) 5 MG TABS tablet Take 1 tablet (5 mg total) by mouth 2 (two) times daily. 180 tablet 3  . atenolol-chlorthalidone (TENORETIC) 100-25 MG tablet Take 1 tablet by mouth every morning.    . calcium carbonate (OS-CAL) 600 MG TABS tablet Take 600 mg by mouth 2 (two) times daily.    . hydrALAZINE (APRESOLINE) 25 MG tablet Take 1 tablet (25 mg total) by mouth 3 (three) times daily. Take together with 50 mg tab 90 tablet 2  . hydrALAZINE (APRESOLINE) 50 MG tablet Take 1 tablet (50 mg total) by mouth 3 (three) times daily. Take together with 25mg  tab 90 tablet 2  . losartan (COZAAR) 100 MG tablet Take 100 mg by mouth daily.    . Magnesium Gluconate (MAGNESIUM 27) 500 (27 Mg) MG TABS Take 500 mg by mouth daily.    . Multiple Vitamin (MULTIVITAMIN) tablet Take 1 tablet by mouth daily.    [name]  pravastatin (PRAVACHOL) 40 MG tablet Take 40 mg by mouth every evening.    . vitamin C (ASCORBIC ACID) 500 MG tablet Take 500 mg by mouth daily.     No current facility-administered medications for this visit.     Allergies  Allergen Reactions  . Nickel Rash  . Sulfa Antibiotics Other (See Comments)    Reaction unknown    Social History   Social History  . Marital status: Widowed    Spouse name: N/A  . Number of children: N/A  . Years of education: N/A   Occupational History  . Not on file.   Social History Main Topics  . Smoking status: Never Smoker  . Smokeless tobacco: Never Used  . Alcohol use 0.6 oz/week    1 Glasses of wine  per week     Comment: wine  . Drug use: No  . Sexual activity: No   Other Topics Concern  . Not on file   Social History Narrative   Widow.  One daughter, lives with her.   Educ: college   Occup: retired Marine scientist.   No T/A/Ds.   She is almost a vegetarian.     Review of Systems: General: negative for chills, fever, night sweats or weight changes.  Cardiovascular: negative for chest pain, dyspnea on exertion, edema, orthopnea, palpitations, paroxysmal nocturnal dyspnea or shortness of breath Dermatological: negative for rash Respiratory: negative for cough or wheezing Urologic: negative for hematuria Abdominal: negative for nausea, vomiting, diarrhea, bright red blood per rectum, melena, or hematemesis Neurologic: negative for visual changes, syncope, or dizziness All other systems reviewed and are otherwise negative except as noted above.    Blood pressure (!) 146/82, pulse 64, height 5' (1.524 m), weight 150 lb 3.2 oz (68.1 kg).  General appearance: alert, cooperative and no distress Neck: no JVD Lungs: clear to auscultation bilaterally Heart: regular rate and rhythm Extremities: no edema Skin: Skin color, texture, turgor normal. No rashes or lesions Neurologic: Grossly normal   ASSESSMENT AND PLAN:   Essential hypertension Better control on increased Hydralazine but increased side effects  Cardioembolic stroke (HCC) Embolic stroke (retinal artery occlusion and embolic bilateral micro emboli on MRI) 01/30/16  PAF (paroxysmal atrial fibrillation) (HCC) CHADs VASc= 5. Holding NSR  Personal history of subarachnoid hemorrhage Xarelto stopped in the past but Eliquis started after retinal artery emboli Sep 2017  Chest pain Atypical for angina. Low risk Myoview Sept 2017, ED visit Oct 2017-MI r/o  Acute on chronic renal insufficiency Bump in SCr to 1.6 in the last 6 weeks  Dyslipidemia On statin Rx  CKD (chronic kidney disease), stage II Now appears to be  stage 3  Single kidney Non functioning Lt kidney, no significant RAS on the Rt by doppler   PLAN  I offered to adjust her medications secondary to side effects. My choice would be to stop her Tenoretic and put her on Coreg and HCTZ or chlorthalidone, and possible try a clonidine patch. She tells me she needs a tooth pulled and wants to wait till after that and decide then if her side effects warrant changing her medications. She'll follow up with Dr Gwenlyn Found in Dec. I suggested she not stop the Eliquis for her tooth extraction.   Kerin Ransom PA-C 03/31/2016 4:05 PM

## 2016-04-02 ENCOUNTER — Other Ambulatory Visit: Payer: Self-pay

## 2016-04-02 MED ORDER — HYDRALAZINE HCL 50 MG PO TABS: 50.0000 mg | ORAL_TABLET | Freq: Three times a day (TID) | ORAL | 1 refills | Status: DC

## 2016-04-28 ENCOUNTER — Other Ambulatory Visit: Payer: Self-pay | Admitting: *Deleted

## 2016-04-28 MED ORDER — HYDRALAZINE HCL 50 MG PO TABS: 50.0000 mg | ORAL_TABLET | Freq: Three times a day (TID) | ORAL | 2 refills | Status: DC

## 2016-04-28 MED ORDER — HYDRALAZINE HCL 25 MG PO TABS: 25.0000 mg | ORAL_TABLET | Freq: Three times a day (TID) | ORAL | 2 refills | Status: DC

## 2016-05-01 ENCOUNTER — Encounter: Payer: Self-pay | Admitting: Cardiovascular Disease

## 2016-05-01 ENCOUNTER — Ambulatory Visit (INDEPENDENT_AMBULATORY_CARE_PROVIDER_SITE_OTHER): Payer: Medicare Other | Admitting: Cardiovascular Disease

## 2016-05-01 VITALS — BP 146/78 | HR 68 | Ht 60.0 in | Wt 145.0 lb

## 2016-05-01 DIAGNOSIS — I480 Paroxysmal atrial fibrillation: Secondary | ICD-10-CM | POA: Diagnosis not present

## 2016-05-01 DIAGNOSIS — I10 Essential (primary) hypertension: Secondary | ICD-10-CM | POA: Diagnosis not present

## 2016-05-01 MED ORDER — HYDRALAZINE HCL 50 MG PO TABS: 50.0000 mg | ORAL_TABLET | Freq: Three times a day (TID) | ORAL | 2 refills | Status: DC

## 2016-05-01 NOTE — Assessment & Plan Note (Signed)
History of paroxysmal atrial fibrillation currently not on oral anticoagulation as the prior subarachnoid hemorrhage despite a CHA2DSVASc2 score of 5.

## 2016-05-01 NOTE — Assessment & Plan Note (Signed)
History of hypertension on multiple antihypertensive medications including amlodipine, atenolol, chlorthalidone, hydralazine and losartan. Her serum creatinine has increased over the last 3 months from 1.15 up to 1.63. She does have a solitary kidney on renal Doppler study performed 02/26/16 with a left renal dimension of 7 cm and absent flow in the right 11 cm with moderate right renal artery stenosis. I'm referring her to a nephrologist to sort out the best Antihypertensive medications. An ARB may not be the best medication given her solitary kidney.

## 2016-05-01 NOTE — Patient Instructions (Addendum)
Medication Instructions: Decrease Hydralazine to 50 mg three times daily.   Follow-Up: Your physician recommends that you schedule a follow-up appointment in: 3 months with Dr. Gwenlyn Found.  You have been referred to [facility]--Dr. Justin Mend or Dr. Florene Glen for hypertension and solitary kidney.  If you need a refill on your cardiac medications before your next appointment, please call your pharmacy.

## 2016-05-01 NOTE — Progress Notes (Signed)
05/01/2016 Valerie Terrell   10-Feb-1933  [id number]  Primary Physician Tammi Sou, MD Primary Cardiologist: Lorretta Harp MD Renae Gloss  HPI:  Ms. Cimini is a pleasant 82 year old widowed Caucasian female mother of one daughter Valerie Terrell with grandchildren who I'm seeing for the first time in the office. 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 She smoked remotely and drinks socially. She does have a history of difficult to control hypertension. Hyperlipidemia. She had a 2-D echo which was normal, a Myoview stress test which was normal and a CT angiogram performed 01/29/16 that showed no evidence of osseous stenosis. She does have difficult to control hypertension with a recent renal Doppler study performed 02/26/16 that showed an atrophic nonfunctioning left kidney and a right kidney of measuring 11 cm with at least a moderate right renal artery stenosis. Her serum creatinine in September was 1.15 and currently isn't elicited is in the 1.6 range potentially related to hay her ARB.   Current Outpatient Prescriptions  Medication Sig Dispense Refill  . amLODipine (NORVASC) 5 MG tablet Take 1 tablet by mouth 2 (two) times daily with breakfast and lunch.    [name] apixaban (ELIQUIS) 5 MG TABS tablet Take 1 tablet (5 mg total) by mouth 2 (two) times daily. 180 tablet 3  . atenolol-chlorthalidone (TENORETIC) 100-25 MG tablet Take 1 tablet by mouth every morning.    . calcium carbonate (OS-CAL) 600 MG TABS tablet Take 600 mg by mouth 2 (two) times daily.    . hydrALAZINE (APRESOLINE) 50 MG tablet Take 1 tablet (50 mg total) by mouth 3 (three) times daily. 270 tablet 2  . losartan (COZAAR) 100 MG tablet Take 100 mg by mouth daily.    . Magnesium Gluconate (MAGNESIUM 27) 500 (27 Mg) MG TABS Take 500 mg by mouth daily.    . Multiple Vitamin  (MULTIVITAMIN) tablet Take 1 tablet by mouth daily.    . pravastatin (PRAVACHOL) 40 MG tablet Take 40 mg by mouth every evening.    . vitamin C (ASCORBIC ACID) 500 MG tablet Take 500 mg by mouth daily.     No current facility-administered medications for this visit.     Allergies  Allergen Reactions  . Nickel Rash  . Sulfa Antibiotics Other (See Comments)    Reaction unknown    Social History   Social History  . Marital status: Widowed    Spouse name: N/A  . Number of children: N/A  . Years of education: N/A   Occupational History  . Not on file.   Social History Main Topics  . Smoking status: Never Smoker  . Smokeless tobacco: Never Used  . Alcohol use 0.6 oz/week    1 Glasses of wine per week     Comment: wine  . Drug use: No  . Sexual activity: No   Other Topics Concern  . Not on file   Social History Narrative   Widow.  One daughter, lives with her.   Educ: college   Occup: retired Marine scientist.   No T/A/Ds.   She is almost a vegetarian.     Review of Systems: General: negative for chills, fever, night sweats or weight changes.  Cardiovascular: negative for chest pain, dyspnea on exertion, edema, orthopnea, palpitations, paroxysmal nocturnal dyspnea or shortness of breath Dermatological: negative for rash Respiratory: negative for  cough or wheezing Urologic: negative for hematuria Abdominal: negative for nausea, vomiting, diarrhea, bright red blood per rectum, melena, or hematemesis Neurologic: negative for visual changes, syncope, or dizziness All other systems reviewed and are otherwise negative except as noted above.    Blood pressure (!) 146/78, pulse 68, height 5' (1.524 m), weight 145 lb (65.8 kg).  General appearance: alert and no distress Neck: no adenopathy, no JVD, supple, symmetrical, trachea midline, thyroid not enlarged, symmetric, no tenderness/mass/nodules and Soft bilateral carotid bruits Lungs: clear to auscultation bilaterally Heart: regular  rate and rhythm, S1, S2 normal, no murmur, click, rub or gallop Extremities: extremities normal, atraumatic, no cyanosis or edema  EKG sinus rhythm at 68 with septal Q waves. I personally reviewed his EKG  ASSESSMENT AND PLAN:   PAF (paroxysmal atrial fibrillation) (HCC) History of paroxysmal atrial fibrillation currently not on oral anticoagulation as the prior subarachnoid hemorrhage despite a CHA2DSVASc2 score of 5.  Essential hypertension History of hypertension on multiple antihypertensive medications including amlodipine, atenolol, chlorthalidone, hydralazine and losartan. Her serum creatinine has increased over the last 3 months from 1.15 up to 1.63. She does have a solitary kidney on renal Doppler study performed 02/26/16 with a left renal dimension of 7 cm and absent flow in the right 11 cm with moderate right renal artery stenosis. I'm referring her to a nephrologist to sort out the best Antihypertensive medications. An ARB may not be the best medication given her solitary kidney.  Dyslipidemia History of hyperlipidemia on pravastatin with recent lipid profile performed 01/29/16 revealed a total cholesterol 174, LDL 97 and HDL of 41.      Lorretta Harp MD FACP,FACC,FAHA, [facility] 05/01/2016 2:46 PM

## 2016-05-01 NOTE — Assessment & Plan Note (Signed)
History of hyperlipidemia on pravastatin with recent lipid profile performed 01/29/16 revealed a total cholesterol 174, LDL 97 and HDL of 41.

## 2016-05-03 ENCOUNTER — Encounter: Payer: Self-pay | Admitting: Family Medicine

## 2016-05-04 ENCOUNTER — Encounter: Payer: Self-pay | Admitting: Family Medicine

## 2016-05-18 DIAGNOSIS — D638 Anemia in other chronic diseases classified elsewhere: Secondary | ICD-10-CM

## 2016-05-18 HISTORY — DX: Anemia in other chronic diseases classified elsewhere: D63.8

## 2016-05-25 ENCOUNTER — Other Ambulatory Visit: Payer: Self-pay | Admitting: *Deleted

## 2016-05-25 MED ORDER — AMLODIPINE BESYLATE 5 MG PO TABS: 5.0000 mg | ORAL_TABLET | Freq: Two times a day (BID) | ORAL | 3 refills | Status: DC

## 2016-06-02 DIAGNOSIS — I609 Nontraumatic subarachnoid hemorrhage, unspecified: Secondary | ICD-10-CM | POA: Diagnosis not present

## 2016-06-02 DIAGNOSIS — N279 Small kidney, unspecified: Secondary | ICD-10-CM | POA: Diagnosis not present

## 2016-06-02 DIAGNOSIS — N390 Urinary tract infection, site not specified: Secondary | ICD-10-CM | POA: Diagnosis not present

## 2016-06-02 DIAGNOSIS — I4891 Unspecified atrial fibrillation: Secondary | ICD-10-CM | POA: Diagnosis not present

## 2016-06-02 DIAGNOSIS — N182 Chronic kidney disease, stage 2 (mild): Secondary | ICD-10-CM | POA: Diagnosis not present

## 2016-06-02 DIAGNOSIS — I10 Essential (primary) hypertension: Secondary | ICD-10-CM | POA: Diagnosis not present

## 2016-06-02 DIAGNOSIS — N179 Acute kidney failure, unspecified: Secondary | ICD-10-CM | POA: Diagnosis not present

## 2016-06-04 ENCOUNTER — Encounter: Payer: Medicare Other | Admitting: Family Medicine

## 2016-06-10 ENCOUNTER — Ambulatory Visit: Payer: Medicare Other | Admitting: Neurology

## 2016-06-10 ENCOUNTER — Ambulatory Visit (INDEPENDENT_AMBULATORY_CARE_PROVIDER_SITE_OTHER): Payer: Medicare Other

## 2016-06-10 ENCOUNTER — Ambulatory Visit (INDEPENDENT_AMBULATORY_CARE_PROVIDER_SITE_OTHER): Payer: Medicare Other | Admitting: Emergency Medicine

## 2016-06-10 VITALS — BP 176/74 | HR 64 | Temp 98.0°F | Resp 16 | Ht 60.0 in | Wt 141.0 lb

## 2016-06-10 DIAGNOSIS — S92352A Displaced fracture of fifth metatarsal bone, left foot, initial encounter for closed fracture: Secondary | ICD-10-CM | POA: Diagnosis not present

## 2016-06-10 DIAGNOSIS — M546 Pain in thoracic spine: Secondary | ICD-10-CM

## 2016-06-10 DIAGNOSIS — S92355A Nondisplaced fracture of fifth metatarsal bone, left foot, initial encounter for closed fracture: Secondary | ICD-10-CM

## 2016-06-10 DIAGNOSIS — S20212A Contusion of left front wall of thorax, initial encounter: Secondary | ICD-10-CM | POA: Insufficient documentation

## 2016-06-10 DIAGNOSIS — S92309A Fracture of unspecified metatarsal bone(s), unspecified foot, initial encounter for closed fracture: Secondary | ICD-10-CM

## 2016-06-10 DIAGNOSIS — M79672 Pain in left foot: Secondary | ICD-10-CM

## 2016-06-10 DIAGNOSIS — M549 Dorsalgia, unspecified: Secondary | ICD-10-CM | POA: Diagnosis not present

## 2016-06-10 HISTORY — DX: Fracture of unspecified metatarsal bone(s), unspecified foot, initial encounter for closed fracture: S92.309A

## 2016-06-10 MED ORDER — HYDROCODONE-ACETAMINOPHEN 5-325 MG PO TABS: 1.0000 | ORAL_TABLET | Freq: Four times a day (QID) | ORAL | 0 refills | Status: DC | PRN

## 2016-06-10 NOTE — Patient Instructions (Addendum)
IF you received an x-ray today, you will receive an invoice from [facility]. Please contact [facility] at [phone] with questions or concerns regarding your invoice.   IF you received labwork today, you will receive an invoice from [facility]. Please contact [facility] at [phone] with questions or concerns regarding your invoice.   Our billing staff will not be able to assist you with questions regarding bills from these companies.  You will be contacted with the lab results as soon as they are available. The fastest way to get your results is to activate your My Chart account. Instructions are located on the last page of this paperwork. If you have not heard from [place] regarding the results in 2 weeks, please contact this office.      Metatarsal Fracture A metatarsal fracture is a broken bone in one of the five bones that connect your toes to the rest of your foot (forefoot fracture). Metatarsals are long bones that can be stressed or cracked easily. A metatarsal fracture can be:  A stress fracture. Stress fractures are cracks in the surface of the metatarsal bone. Athletes often get stress fractures.  A complete fracture. A complete fracture goes all the way through the bone. The bone that connects to the pinky toe (fifth metatarsal) is the most commonly fractured metatarsal. Ballet dancers often fracture this bone. What are the causes? This type of fracture may be caused by:  A sudden twisting of your foot.  A fall onto your foot.  Overuse or repetitive exercise. What increases the risk? This condition is more likely to develop in people who:  Play contact sports.  Have a bone disease.  Have a low calcium level. What are the signs or symptoms? Symptoms of this condition include:  Pain that is worse when walking or standing.  Pain when pressing on the foot or moving the toes.  Swelling.  Bruising on the top or bottom of the foot.  A foot  that appears shorter than the other one. How is this diagnosed? This condition is diagnosed with a physical exam. You may also have imaging tests, such as:  X-rays.  A CT scan.  MRI. How is this treated? Treatment for this condition depends on its severity and whether a bone has moved out of place. Treatment may involve:  Rest.  Wearing foot support such as a cast, splint, or boot for several weeks.  Using crutches.  Surgery to move bones back into the right position. Surgery is usually needed if there are many pieces of broken bone or bones that are very out of place (displaced fracture).  Physical therapy. This may be needed to help you regain full movement and strength in your foot. You will need to return to your health care provider to have X-rays taken until your bones heal. Your health care provider will look at the X-rays to make sure that your foot is healing well. Follow these instructions at home: If you have a cast:  Do not stick anything inside the cast to scratch your skin. Doing that increases your risk of infection.  Check the skin around the cast every day. Report any concerns to your health care provider. You may put lotion on dry skin around the edges of the cast. Do not apply lotion to the skin underneath the cast.  Keep the cast clean and dry. If you have a splint or a supportive boot:  Wear it as directed by your health care provider.  Remove it only as directed by your health care provider.  Loosen it if your toes become numb and tingle, or if they turn cold and blue.  Keep it clean and dry. Bathing  Do not take baths, swim, or use a hot tub until your health care provider approves. Ask your health care provider if you can take showers. You may only be allowed to take sponge baths for bathing.  If your health care provider approves bathing and showering, cover the cast or splint with a watertight plastic bag to protect it from water. Do not let the cast  or splint get wet. Managing pain, stiffness, and swelling  If directed, apply ice to the injured area (if you have a splint, not a cast).  Put ice in a plastic bag.  Place a towel between your skin and the bag.  Leave the ice on for 20 minutes, 2-3 times per day.  Move your toes often to avoid stiffness and to lessen swelling.  Raise (elevate) the injured area above the level of your heart while you are sitting or lying down. Driving  Do not drive or operate heavy machinery while taking pain medicine.  Do not drive while wearing foot support on a foot that you use for driving. Activity  Return to your normal activities as directed by your health care provider. Ask your health care provider what activities are safe for you.  Perform exercises as directed by your health care provider or physical therapist. Safety  Do not use the injured foot to support your body weight until your health care provider says that you can. Use crutches as directed by your health care provider. General instructions  Do not put pressure on any part of the cast or splint until it is fully hardened. This may take several hours.  Do not use any tobacco products, including cigarettes, chewing tobacco, or e-cigarettes. Tobacco can delay bone healing. If you need help quitting, ask your health care provider.  Take medicines only as directed by your health care provider.  Keep all follow-up visits as directed by your health care provider. This is important. Contact a health care provider if:  You have a fever.  Your cast, splint, or boot is too loose or too tight.  Your cast, splint, or boot is damaged.  Your pain medicine is not helping.  You have pain, tingling, or numbness in your foot that is not going away. Get help right away if:  You have severe pain.  You have tingling or numbness in your foot that is getting worse.  Your foot feels cold or becomes numb.  Your foot changes color. This  information is not intended to replace advice given to you by your health care provider. Make sure you discuss any questions you have with your health care provider. Document Released: 01/24/2002 Document Revised: 01/07/2016 Document Reviewed: 02/28/2014 Elsevier Interactive Patient Education  2017 [place].  Rib Contusion Introduction A rib contusion is a deep bruise on your rib area. Contusions are the result of a blunt trauma that causes bleeding and injury to the tissues under the skin. A rib contusion may involve bruising of the ribs and of the skin and muscles in the area. The skin overlying the contusion may turn blue, purple, or yellow. Minor injuries will give you a painless contusion, but more severe contusions may stay painful and swollen for a few weeks. What are the causes? A contusion is usually caused by a blow, trauma, or  direct force to an area of the body. This often occurs while playing contact sports. What are the signs or symptoms?  Swelling and redness of the injured area.  Discoloration of the injured area.  Tenderness and soreness of the injured area.  Pain with or without movement. How is this diagnosed? The diagnosis can be made by taking a medical history and performing a physical exam. An X-ray, CT scan, or MRI may be needed to determine if there were any associated injuries, such as broken bones (fractures) or internal injuries. How is this treated? Often, the best treatment for a rib contusion is rest. Icing or applying cold compresses to the injured area may help reduce swelling and inflammation. Deep breathing exercises may be recommended to reduce the risk of partial lung collapse and pneumonia. Over-the-counter or prescription medicines may also be recommended for pain control. Follow these instructions at home:  Apply ice to the injured area:  Put ice in a plastic bag.  Place a towel between your skin and the bag.  Leave the ice on for 20 minutes,  2-3 times per day.  Take medicines only as directed by your health care provider.  Rest the injured area. Avoid strenuous activity and any activities or movements that cause pain. Be careful during activities and avoid bumping the injured area.  Perform deep-breathing exercises as directed by your health care provider.  Do not lift anything that is heavier than 5 lb (2.3 kg) until your health care provider approves.  Do not use any tobacco products, including cigarettes, chewing tobacco, or electronic cigarettes. If you need help quitting, ask your health care provider. Contact a health care provider if:  You have increased bruising or swelling.  You have pain that is not controlled with treatment.  You have a fever. Get help right away if:  You have difficulty breathing or shortness of breath.  You develop a continual cough, or you cough up thick or bloody sputum.  You feel sick to your stomach (nauseous), you throw up (vomit), or you have abdominal pain. This information is not intended to replace advice given to you by your health care provider. Make sure you discuss any questions you have with your health care provider. Document Released: 01/27/2001 Document Revised: 10/10/2015 Document Reviewed: 02/13/2014  2017 [employer]

## 2016-06-10 NOTE — Progress Notes (Signed)
Valerie Terrell 83 y.o.   Chief Complaint  Patient presents with  . Fall    x 2 wks  . Back Pain    lower back/ pain is about a 10  . Foot Pain    left    HISTORY OF PRESENT ILLNESS: This is a 83 y.o. female fell backwards after slipping 2 weeks ago and injured left midback region and also twisted left foot; ambulatory without problem but still has pain to both areas; took residual Hydrocodone today with relief so at present time pain is minimal. No other injuries or significant symptoms.  HPI   Prior to Admission medications   Medication Sig Start Date End Date Taking? Authorizing Provider  amLODipine (NORVASC) 5 MG tablet Take 1 tablet (5 mg total) by mouth 2 (two) times daily with breakfast and lunch. 05/25/16  Yes Lorretta Harp, MD  apixaban (ELIQUIS) 5 MG TABS tablet Take 1 tablet (5 mg total) by mouth 2 (two) times daily. 02/12/16  Yes Luke K Kilroy, PA-C  atenolol-chlorthalidone (TENORETIC) 100-25 MG tablet Take 1 tablet by mouth every morning. 01/27/16  Yes Historical Provider, MD  calcium carbonate (OS-CAL) 600 MG TABS tablet Take 600 mg by mouth 2 (two) times daily.   Yes Historical Provider, MD  hydrALAZINE (APRESOLINE) 50 MG tablet Take 1 tablet (50 mg total) by mouth 3 (three) times daily. 05/01/16 07/30/16 Yes Lorretta Harp, MD  losartan (COZAAR) 100 MG tablet Take 100 mg by mouth daily. 01/27/16  Yes Historical Provider, MD  Magnesium Gluconate (MAGNESIUM 27) 500 (27 Mg) MG TABS Take 500 mg by mouth daily.   Yes Historical Provider, MD  Multiple Vitamin (MULTIVITAMIN) tablet Take 1 tablet by mouth daily.   Yes Historical Provider, MD  pravastatin (PRAVACHOL) 40 MG tablet Take 40 mg by mouth every evening. 07/16/15  Yes Historical Provider, MD  vitamin C (ASCORBIC ACID) 500 MG tablet Take 500 mg by mouth daily.   Yes Historical Provider, MD  HYDROcodone-acetaminophen (NORCO) 5-325 MG tablet Take 1 tablet by mouth every 6 (six) hours as needed for moderate pain. 06/10/16    Horald Pollen, MD    Allergies  Allergen Reactions  . Codeine Nausea Only  . Nickel Rash  . Sulfa Antibiotics Other (See Comments)    Reaction unknown    Patient Active Problem List   Diagnosis Date Noted  . Acute on chronic renal insufficiency 03/13/2016  . Single kidney 03/13/2016  . Chest pain   . Dyslipidemia   . PAF (paroxysmal atrial fibrillation) ([place]) 01/28/2016  . Essential hypertension 01/28/2016  . Cardioembolic stroke ([place]) 16/02/9603  . 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 Personal history of subarachnoid hemorrhage 01/28/2016  . CKD (chronic kidney disease), stage II 01/28/2016    Past Medical History:  Diagnosis Date  . Atrophy of left kidney   . 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 Chronic renal insufficiency, stage 2 (mild)    GFR @ 60.  01/2016 renal u/s showed atrophic/hypoplastic left kidney.  No hydronephrosis.  Small right renal cyst.  . History of adenomatous polyp of colon   . History of subarachnoid hemorrhage 10/2014   after syncope and while on xarelto  . Hyperlipidemia   . Hypertension    Difficult to control, in the setting of one functioning kidney: pt was referred to nephrology by Dr. Gwenlyn Found 06/2015.  . Lumbar radiculopathy 2012  . Osteopenia 2014   T-score -2.1  . PAF (  paroxysmal atrial fibrillation) (HCC)    Eliquis started after BRAO and CVA  . Stroke [facility])    cardioembolic (had CVA while on no anticoag)--"scattered subacute punctate infarcts: 1 in R parietal lobe and 2 in occipital cortex" on MRI br.  CT angio head/neck: aortic arch athero    Past Surgical History:  Procedure Laterality Date  . APPENDECTOMY  child  . CARDIOVASCULAR STRESS TEST  01/31/2016   Stress myoview: NORMAL/Low risk.  EF 56%.  . [place]  . COLONOSCOPY  2015   + hx of adenomatous polyps.  Need digest health spec in [place] records to see when pt due for next colonoscopy  . TONSILLECTOMY    .  TRANSTHORACIC ECHOCARDIOGRAM  01/29/2016   EF 55-60%, normal LV wall motion, grade I DD.  No cardiac source of emboli was seen.    Social History   Social History  . Marital status: Widowed    Spouse name: N/A  . Number of children: N/A  . Years of education: N/A   Occupational History  . Not on file.   Social History Main Topics  . Smoking status: Never Smoker  . Smokeless tobacco: Never Used  . Alcohol use 0.6 oz/week    1 Glasses of wine per week     Comment: wine  . Drug use: No  . Sexual activity: No   Other Topics Concern  . Not on file   Social History Narrative   Widow.  One daughter, lives with her.   Educ: college   Occup: retired Marine scientist.   No T/A/Ds.   She is almost a vegetarian.    Family History  Problem Relation Age of Onset  . Heart disease Father   . Early death Father   . Sudden Cardiac Death Neg Hx      Review of Systems  Constitutional: Negative.  Negative for chills and fever.  HENT: Negative.  Negative for nosebleeds.   Eyes: Negative.  Negative for blurred vision, discharge and redness.  Respiratory: Negative.  Negative for cough, hemoptysis and shortness of breath.   Cardiovascular: Negative.  Negative for chest pain, palpitations and leg swelling.  Gastrointestinal: Negative.  Negative for abdominal pain, diarrhea, nausea and vomiting.  Genitourinary: Negative.  Negative for dysuria and hematuria.  Musculoskeletal: Positive for back pain. Negative for neck pain.       Pain to left foot and left posterior lower rib cage  Skin: Negative.  Negative for rash.  Neurological: Negative for dizziness, tingling, sensory change, speech change, focal weakness, weakness and headaches.  Endo/Heme/Allergies: Negative.   Psychiatric/Behavioral: Negative.   All other systems reviewed and are negative.  Vitals:   06/10/16 0943  BP: (!) 176/74  Pulse: 64  Resp: 16  Temp: 98 F (36.7 C)     Physical Exam  Constitutional: She is oriented to  person, place, and time. She appears well-developed and well-nourished.  HENT:  Head: Normocephalic and atraumatic.  Nose: Nose normal.  Mouth/Throat: Oropharynx is clear and moist.  Eyes: Conjunctivae and EOM are normal. Pupils are equal, round, and reactive to light.  Neck: Normal range of motion. Neck supple.  Cardiovascular: Normal rate, regular rhythm and normal heart sounds.   Pulmonary/Chest: Effort normal and breath sounds normal.  Abdominal: Soft. She exhibits no distension. There is no tenderness.  Musculoskeletal:  +tenderness to left lower posterior rib cage Left Foot: +bruising visible to proximal 4th and 5th toes with minimal swelling; NVI with FROM, + tenderness to  deep palpation distal 5th metatarsal  Neurological: She is alert and oriented to person, place, and time. No sensory deficit. She exhibits normal muscle tone.  Skin: Skin is warm and dry. Capillary refill takes less than 2 seconds.  Psychiatric: She has a normal mood and affect. Her behavior is normal.  Vitals reviewed.  xrays reviewed. +5th left distal metatarsal non-displaced  ASSESSMENT & PLAN: Pain management as described. Pt ambulatory without difficulty. Injuries 37 weeks old. Splint probably not helpful at this time. Will refer to Ortho for follow up. Syona was seen today for fall, back pain and foot pain.  Diagnoses and all orders for this visit:  Rib contusion, left, initial encounter  Acute left-sided thoracic back pain -     DG Ribs Unilateral W/Chest Left; Future  Left foot pain -     DG Foot Complete Left; Future  Nondisplaced fracture of fifth metatarsal bone, left foot, initial encounter for closed fracture -     Ambulatory referral to Orthopedic Surgery  Other orders -     HYDROcodone-acetaminophen (NORCO) 5-325 MG tablet; Take 1 tablet by mouth every 6 (six) hours as needed for moderate pain.     Patient Instructions       IF you received an x-ray today, you will receive an  invoice from [facility]. Please contact [facility] at [phone] with questions or concerns regarding your invoice.   IF you received labwork today, you will receive an invoice from [facility]. Please contact [facility] at [phone] with questions or concerns regarding your invoice.   Our billing staff will not be able to assist you with questions regarding bills from these companies.  You will be contacted with the lab results as soon as they are available. The fastest way to get your results is to activate your My Chart account. Instructions are located on the last page of this paperwork. If you have not heard from [place] regarding the results in 2 weeks, please contact this office.      Metatarsal Fracture A metatarsal fracture is a broken bone in one of the five bones that connect your toes to the rest of your foot (forefoot fracture). Metatarsals are long bones that can be stressed or cracked easily. A metatarsal fracture can be:  A stress fracture. Stress fractures are cracks in the surface of the metatarsal bone. Athletes often get stress fractures.  A complete fracture. A complete fracture goes all the way through the bone. The bone that connects to the pinky toe (fifth metatarsal) is the most commonly fractured metatarsal. Ballet dancers often fracture this bone. What are the causes? This type of fracture may be caused by:  A sudden twisting of your foot.  A fall onto your foot.  Overuse or repetitive exercise. What increases the risk? This condition is more likely to develop in people who:  Play contact sports.  Have a bone disease.  Have a low calcium level. What are the signs or symptoms? Symptoms of this condition include:  Pain that is worse when walking or standing.  Pain when pressing on the foot or moving the toes.  Swelling.  Bruising on the top or bottom of the foot.  A foot that appears shorter than the other one. How is this  diagnosed? This condition is diagnosed with a physical exam. You may also have imaging tests, such as:  X-rays.  A CT scan.  MRI. How is this treated? Treatment for this condition depends on its severity and whether  a bone has moved out of place. Treatment may involve:  Rest.  Wearing foot support such as a cast, splint, or boot for several weeks.  Using crutches.  Surgery to move bones back into the right position. Surgery is usually needed if there are many pieces of broken bone or bones that are very out of place (displaced fracture).  Physical therapy. This may be needed to help you regain full movement and strength in your foot. You will need to return to your health care provider to have X-rays taken until your bones heal. Your health care provider will look at the X-rays to make sure that your foot is healing well. Follow these instructions at home: If you have a cast:  Do not stick anything inside the cast to scratch your skin. Doing that increases your risk of infection.  Check the skin around the cast every day. Report any concerns to your health care provider. You may put lotion on dry skin around the edges of the cast. Do not apply lotion to the skin underneath the cast.  Keep the cast clean and dry. If you have a splint or a supportive boot:  Wear it as directed by your health care provider. Remove it only as directed by your health care provider.  Loosen it if your toes become numb and tingle, or if they turn cold and blue.  Keep it clean and dry. Bathing  Do not take baths, swim, or use a hot tub until your health care provider approves. Ask your health care provider if you can take showers. You may only be allowed to take sponge baths for bathing.  If your health care provider approves bathing and showering, cover the cast or splint with a watertight plastic bag to protect it from water. Do not let the cast or splint get wet. Managing pain, stiffness, and  swelling  If directed, apply ice to the injured area (if you have a splint, not a cast).  Put ice in a plastic bag.  Place a towel between your skin and the bag.  Leave the ice on for 20 minutes, 2-3 times per day.  Move your toes often to avoid stiffness and to lessen swelling.  Raise (elevate) the injured area above the level of your heart while you are sitting or lying down. Driving  Do not drive or operate heavy machinery while taking pain medicine.  Do not drive while wearing foot support on a foot that you use for driving. Activity  Return to your normal activities as directed by your health care provider. Ask your health care provider what activities are safe for you.  Perform exercises as directed by your health care provider or physical therapist. Safety  Do not use the injured foot to support your body weight until your health care provider says that you can. Use crutches as directed by your health care provider. General instructions  Do not put pressure on any part of the cast or splint until it is fully hardened. This may take several hours.  Do not use any tobacco products, including cigarettes, chewing tobacco, or e-cigarettes. Tobacco can delay bone healing. If you need help quitting, ask your health care provider.  Take medicines only as directed by your health care provider.  Keep all follow-up visits as directed by your health care provider. This is important. Contact a health care provider if:  You have a fever.  Your cast, splint, or boot is too loose or too tight.  Your cast, splint, or boot is damaged.  Your pain medicine is not helping.  You have pain, tingling, or numbness in your foot that is not going away. Get help right away if:  You have severe pain.  You have tingling or numbness in your foot that is getting worse.  Your foot feels cold or becomes numb.  Your foot changes color. This information is not intended to replace advice  given to you by your health care provider. Make sure you discuss any questions you have with your health care provider. Document Released: 01/24/2002 Document Revised: 01/07/2016 Document Reviewed: 02/28/2014 Elsevier Interactive Patient Education  2017 [place].  Rib Contusion Introduction A rib contusion is a deep bruise on your rib area. Contusions are the result of a blunt trauma that causes bleeding and injury to the tissues under the skin. A rib contusion may involve bruising of the ribs and of the skin and muscles in the area. The skin overlying the contusion may turn blue, purple, or yellow. Minor injuries will give you a painless contusion, but more severe contusions may stay painful and swollen for a few weeks. What are the causes? A contusion is usually caused by a blow, trauma, or direct force to an area of the body. This often occurs while playing contact sports. What are the signs or symptoms?  Swelling and redness of the injured area.  Discoloration of the injured area.  Tenderness and soreness of the injured area.  Pain with or without movement. How is this diagnosed? The diagnosis can be made by taking a medical history and performing a physical exam. An X-ray, CT scan, or MRI may be needed to determine if there were any associated injuries, such as broken bones (fractures) or internal injuries. How is this treated? Often, the best treatment for a rib contusion is rest. Icing or applying cold compresses to the injured area may help reduce swelling and inflammation. Deep breathing exercises may be recommended to reduce the risk of partial lung collapse and pneumonia. Over-the-counter or prescription medicines may also be recommended for pain control. Follow these instructions at home:  Apply ice to the injured area:  Put ice in a plastic bag.  Place a towel between your skin and the bag.  Leave the ice on for 20 minutes, 2-3 times per day.  Take medicines only as  directed by your health care provider.  Rest the injured area. Avoid strenuous activity and any activities or movements that cause pain. Be careful during activities and avoid bumping the injured area.  Perform deep-breathing exercises as directed by your health care provider.  Do not lift anything that is heavier than 5 lb (2.3 kg) until your health care provider approves.  Do not use any tobacco products, including cigarettes, chewing tobacco, or electronic cigarettes. If you need help quitting, ask your health care provider. Contact a health care provider if:  You have increased bruising or swelling.  You have pain that is not controlled with treatment.  You have a fever. Get help right away if:  You have difficulty breathing or shortness of breath.  You develop a continual cough, or you cough up thick or bloody sputum.  You feel sick to your stomach (nauseous), you throw up (vomit), or you have abdominal pain. This information is not intended to replace advice given to you by your health care provider. Make sure you discuss any questions you have with your health care provider. Document Released: 01/27/2001 Document Revised: 10/10/2015  Document Reviewed: 02/13/2014  2017 Elsevier     Agustina Caroli, MD [facility]

## 2016-06-15 ENCOUNTER — Encounter: Payer: Self-pay | Admitting: Family Medicine

## 2016-06-16 ENCOUNTER — Encounter: Payer: Self-pay | Admitting: Family Medicine

## 2016-06-16 ENCOUNTER — Other Ambulatory Visit: Payer: Self-pay | Admitting: Family Medicine

## 2016-06-16 ENCOUNTER — Ambulatory Visit (INDEPENDENT_AMBULATORY_CARE_PROVIDER_SITE_OTHER): Payer: Medicare Other | Admitting: Family Medicine

## 2016-06-16 VITALS — BP 153/67 | HR 58 | Temp 97.9°F | Resp 16 | Ht 58.75 in | Wt 139.5 lb

## 2016-06-16 DIAGNOSIS — N182 Chronic kidney disease, stage 2 (mild): Secondary | ICD-10-CM | POA: Diagnosis not present

## 2016-06-16 DIAGNOSIS — I480 Paroxysmal atrial fibrillation: Secondary | ICD-10-CM

## 2016-06-16 DIAGNOSIS — Z0000 Encounter for general adult medical examination without abnormal findings: Secondary | ICD-10-CM | POA: Diagnosis not present

## 2016-06-16 DIAGNOSIS — Z8673 Personal history of transient ischemic attack (TIA), and cerebral infarction without residual deficits: Secondary | ICD-10-CM | POA: Diagnosis not present

## 2016-06-16 DIAGNOSIS — Z1231 Encounter for screening mammogram for malignant neoplasm of breast: Secondary | ICD-10-CM

## 2016-06-16 NOTE — Progress Notes (Signed)
Office Note 06/16/2016  CC:  Chief Complaint  Patient presents with  . Annual Exam    Pt is fasting    HPI:  Valerie Terrell is a 83 y.o. female who is here for annual health maintenance exam. BPs have still been erratic, hydralazine has been titrated up to 75mg  tid.  Clonidine recently started. Cardiology and nephrology still following closely.  Most recent eye exam 03/2016.  Has f/u set for 08/2016 with Dr. Zigmund Daniel.   Past Medical History:  Diagnosis Date  . Atrophy of left kidney   . 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 Chronic renal insufficiency, stage 2 (mild)    GFR @ 60.  01/2016 renal u/s showed atrophic/hypoplastic left kidney.  No hydronephrosis.  Small right renal cyst.  . History of adenomatous polyp of colon   . History of subarachnoid hemorrhage 10/2014   after syncope and while on xarelto  . Hyperlipidemia   . Hypertension    Difficult to control, in the setting of one functioning kidney: pt was referred to nephrology by Dr. Gwenlyn Found 06/2015.  . Lumbar radiculopathy 2012  . Metatarsal fracture 06/10/2016   Nondisplaced, left 5th metatarsal--pt was referred to ortho  . Osteopenia 2014   T-score -2.1  . PAF (paroxysmal atrial fibrillation) (HCC)    Eliquis started after BRAO and CVA  . Stroke [facility])    cardioembolic (had CVA while on no anticoag)--"scattered subacute punctate infarcts: 1 in R parietal lobe and 2 in occipital cortex" on MRI br.  CT angio head/neck: aortic arch athero    Past Surgical History:  Procedure Laterality Date  . APPENDECTOMY  child  . CARDIOVASCULAR STRESS TEST  01/31/2016   Stress myoview: NORMAL/Low risk.  EF 56%.  . [place]  . COLONOSCOPY  2015   + hx of adenomatous polyps.  Need digest health spec in [place] records to see when pt due for next colonoscopy  . TONSILLECTOMY    . TRANSTHORACIC ECHOCARDIOGRAM  01/29/2016   EF 55-60%, normal LV wall motion, grade I DD.  No cardiac source of emboli was  seen.    Family History  Problem Relation Age of Onset  . Heart disease Father   . Early death Father   . Sudden Cardiac Death Neg Hx     Social History   Social History  . Marital status: Widowed    Spouse name: N/A  . Number of children: N/A  . Years of education: N/A   Occupational History  . Not on file.   Social History Main Topics  . Smoking status: Never Smoker  . Smokeless tobacco: Never Used  . Alcohol use 0.6 oz/week    1 Glasses of wine per week     Comment: wine  . Drug use: No  . Sexual activity: No   Other Topics Concern  . Not on file   Social History Narrative   Widow.  One daughter, lives with her.   Educ: college   Occup: retired Marine scientist.   No T/A/Ds.   She is almost a vegetarian.    Outpatient Medications Prior to Visit  Medication Sig Dispense Refill  . amLODipine (NORVASC) 5 MG tablet Take 1 tablet (5 mg total) by mouth 2 (two) times daily with breakfast and lunch. 180 tablet 3  . apixaban (ELIQUIS) 5 MG TABS tablet Take 1 tablet (5 mg total) by mouth 2 (two) times daily. 180 tablet 3  . atenolol-chlorthalidone (TENORETIC) 100-25 MG tablet Take  1 tablet by mouth every morning.    . calcium carbonate (OS-CAL) 600 MG TABS tablet Take 600 mg by mouth 2 (two) times daily.    . hydrALAZINE (APRESOLINE) 50 MG tablet Take 1 tablet (50 mg total) by mouth 3 (three) times daily. (Patient taking differently: Take 75 mg by mouth 3 (three) times daily. ) 270 tablet 2  . HYDROcodone-acetaminophen (NORCO) 5-325 MG tablet Take 1 tablet by mouth every 6 (six) hours as needed for moderate pain. 20 tablet 0  . Magnesium Gluconate (MAGNESIUM 27) 500 (27 Mg) MG TABS Take 500 mg by mouth daily.    . Multiple Vitamin (MULTIVITAMIN) tablet Take 1 tablet by mouth daily.    . pravastatin (PRAVACHOL) 40 MG tablet Take 40 mg by mouth every evening.    . vitamin C (ASCORBIC ACID) 500 MG tablet Take 500 mg by mouth daily.    [name] losartan (COZAAR) 100 MG tablet Take 100 mg by  mouth daily.     No facility-administered medications prior to visit.     Allergies  Allergen Reactions  . Codeine Nausea Only  . Nickel Rash  . Sulfa Antibiotics Other (See Comments)    Reaction unknown    ROS Review of Systems  Constitutional: Negative for appetite change, chills, fatigue and fever.  HENT: Negative for congestion, dental problem, ear pain and sore throat.   Eyes: Negative for discharge, redness and visual disturbance.  Respiratory: Negative for cough, chest tightness, shortness of breath and wheezing.   Cardiovascular: Negative for chest pain, palpitations and leg swelling.  Gastrointestinal: Negative for abdominal pain, blood in stool, diarrhea, nausea and vomiting.  Genitourinary: Negative for difficulty urinating, dysuria, flank pain, frequency, hematuria and urgency.  Musculoskeletal: Negative for arthralgias, back pain, joint swelling, myalgias and neck stiffness.       Mild L 5th toe pain from recent nondisplaced fracture--improving  Skin: Negative for pallor and rash.  Neurological: Negative for dizziness, speech difficulty, weakness and headaches.  Hematological: Negative for adenopathy. Does not bruise/bleed easily.  Psychiatric/Behavioral: Negative for confusion and sleep disturbance. The patient is not nervous/anxious.     PE; Blood pressure (!) 153/67, pulse (!) 58, temperature 97.9 F (36.6 C), temperature source Oral, resp. rate 16, height 4' 10.75" (1.492 m), weight 139 lb 8 oz (63.3 kg), SpO2 96 %.  Pt examined with Starla Link, CMA, as chaperone. Gen: Alert, well appearing.  Patient is oriented to person, place, time, and situation. AFFECT: pleasant, lucid thought and speech. ENT: Ears: EACs clear, normal epithelium.  TMs with good light reflex and landmarks bilaterally.  Eyes: no injection, icteris, swelling, or exudate.  EOMI, PERRLA. Nose: no drainage or turbinate edema/swelling.  No injection or focal lesion.  Mouth: lips without  lesion/swelling.  Oral mucosa pink and moist.  Dentition intact and without obvious caries or gingival swelling.  Oropharynx without erythema, exudate, or swelling.  Neck: supple/nontender.  No LAD, mass, or TM.  Carotid pulses 2+ bilaterally, without bruits. CV: RRR, no m/r/g.   LUNGS: CTA bilat, nonlabored resps, good aeration in all lung fields. ABD: soft, NT, ND, BS normal.  No hepatospenomegaly or mass.  No bruits. EXT: no clubbing, cyanosis, or edema.  Musculoskeletal: no joint swelling, erythema, warmth, or tenderness.  ROM of all joints intact. Skin - no sores or suspicious lesions or rashes or color changes   Pertinent labs:  Lab Results  Component Value Date   TSH 2.57 03/23/2016   Lab Results  Component Value Date  WBC 5.5 02/27/2016   HGB 12.6 02/27/2016   HCT 39.0 02/27/2016   MCV 92.9 02/27/2016   PLT 184 02/27/2016   Lab Results  Component Value Date   CREATININE 1.63 (H) 03/24/2016   BUN 32 (H) 03/24/2016   NA 138 03/24/2016   K 4.5 03/24/2016   CL 102 03/24/2016   CO2 26 03/24/2016   Lab Results  Component Value Date   ALT 28 02/02/2016   AST 42 (H) 02/02/2016   ALKPHOS 39 02/02/2016   BILITOT 0.8 02/02/2016   Lab Results  Component Value Date   CHOL 174 01/29/2016   Lab Results  Component Value Date   HDL 41 01/29/2016   Lab Results  Component Value Date   LDLCALC 97 01/29/2016   Lab Results  Component Value Date   TRIG 180 (H) 01/29/2016   Lab Results  Component Value Date   CHOLHDL 4.2 01/29/2016   Lab Results  Component Value Date   HGBA1C 5.6 01/29/2016    ASSESSMENT AND PLAN:   Health maintenance exam: Reviewed age and gender appropriate health maintenance issues (prudent diet, regular exercise, health risks of tobacco and excessive alcohol, use of seatbelts, fire alarms in home, use of sunscreen).  Also reviewed age and gender appropriate health screening as well as vaccine recommendations. Cerv ca screening: she won't  pursue any further screening for this.  She declines DEXA screening today. Breast ca screening: she will be setting up mammogram ASAP. Colon ca screening: hx of adenomatous polyps; pt states she was told to return for repeat TCS 2018 or 2019.   Labs today: CBC, CMET, FLP. Vaccines UTD.  An After Visit Summary was printed and given to the patient.  FOLLOW UP:  Return in about 6 months (around 12/14/2016) for routine chronic illness f/u.  Signed:  Crissie Sickles, MD           06/16/2016

## 2016-06-16 NOTE — Addendum Note (Signed)
Addended by: Ralph Dowdy on: 06/16/2016 09:51 AM   Modules accepted: Orders

## 2016-06-16 NOTE — Progress Notes (Signed)
Pre visit review using our clinic review tool, if applicable. No additional management support is needed unless otherwise documented below in the visit note. 

## 2016-06-17 DIAGNOSIS — Z0000 Encounter for general adult medical examination without abnormal findings: Secondary | ICD-10-CM | POA: Diagnosis not present

## 2016-06-17 DIAGNOSIS — N182 Chronic kidney disease, stage 2 (mild): Secondary | ICD-10-CM | POA: Diagnosis not present

## 2016-06-17 DIAGNOSIS — Z8673 Personal history of transient ischemic attack (TIA), and cerebral infarction without residual deficits: Secondary | ICD-10-CM | POA: Diagnosis not present

## 2016-06-17 DIAGNOSIS — I480 Paroxysmal atrial fibrillation: Secondary | ICD-10-CM | POA: Diagnosis not present

## 2016-06-18 LAB — CBC/DIFF AMBIGUOUS DEFAULT
BASOS: 0 %
Basophils Absolute: 0 10*3/uL (ref 0.0–0.2)
EOS (ABSOLUTE): 0.3 10*3/uL (ref 0.0–0.4)
Eos: 4 %
Hematocrit: 38.4 % (ref 34.0–46.6)
Hemoglobin: 12.2 g/dL (ref 11.1–15.9)
IMMATURE GRANS (ABS): 0 10*3/uL (ref 0.0–0.1)
Immature Granulocytes: 0 %
Lymphocytes Absolute: 1.2 10*3/uL (ref 0.7–3.1)
Lymphs: 15 %
MCH: 29.9 pg (ref 26.6–33.0)
MCHC: 31.8 g/dL (ref 31.5–35.7)
MCV: 94 fL (ref 79–97)
MONOS ABS: 1.1 10*3/uL — AB (ref 0.1–0.9)
Monocytes: 13 %
NEUTROS ABS: 5.5 10*3/uL (ref 1.4–7.0)
Neutrophils: 68 %
Platelets: 266 10*3/uL (ref 150–379)
RBC: 4.08 x10E6/uL (ref 3.77–5.28)
RDW: 14 % (ref 12.3–15.4)
WBC: 8.1 10*3/uL (ref 3.4–10.8)

## 2016-06-18 LAB — COMPREHENSIVE METABOLIC PANEL
ALK PHOS: 61 IU/L (ref 39–117)
ALT: 17 IU/L (ref 0–32)
AST: 25 IU/L (ref 0–40)
Albumin/Globulin Ratio: 1.5 (ref 1.2–2.2)
Albumin: 3.7 g/dL (ref 3.5–4.7)
BUN/Creatinine Ratio: 16 (ref 12–28)
BUN: 22 mg/dL (ref 8–27)
Bilirubin Total: 0.2 mg/dL (ref 0.0–1.2)
CO2: 26 mmol/L (ref 18–29)
CREATININE: 1.41 mg/dL — AB (ref 0.57–1.00)
Calcium: 8.7 mg/dL (ref 8.7–10.3)
Chloride: 103 mmol/L (ref 96–106)
GFR calc Af Amer: 40 mL/min/{1.73_m2} — ABNORMAL LOW (ref >59)
GFR calc non Af Amer: 35 mL/min/{1.73_m2} — ABNORMAL LOW (ref >59)
GLUCOSE: 99 mg/dL (ref 65–99)
Globulin, Total: 2.4 g/dL (ref 1.5–4.5)
Potassium: 3.8 mmol/L (ref 3.5–5.2)
Sodium: 141 mmol/L (ref 134–144)
Total Protein: 6.1 g/dL (ref 6.0–8.5)

## 2016-06-18 LAB — LIPID PANEL
Chol/HDL Ratio: 3.6 ratio units (ref 0.0–4.4)
Cholesterol, Total: 140 mg/dL (ref 100–199)
HDL: 39 mg/dL — ABNORMAL LOW (ref >39)
LDL CALC: 72 mg/dL (ref 0–99)
TRIGLYCERIDES: 146 mg/dL (ref 0–149)
VLDL CHOLESTEROL CAL: 29 mg/dL (ref 5–40)

## 2016-06-22 ENCOUNTER — Ambulatory Visit: Payer: Medicare Other

## 2016-06-23 DIAGNOSIS — I10 Essential (primary) hypertension: Secondary | ICD-10-CM | POA: Diagnosis not present

## 2016-06-24 ENCOUNTER — Encounter: Payer: Self-pay | Admitting: Cardiovascular Disease

## 2016-06-24 ENCOUNTER — Ambulatory Visit: Payer: Medicare Other | Admitting: Cardiology

## 2016-06-24 ENCOUNTER — Ambulatory Visit (INDEPENDENT_AMBULATORY_CARE_PROVIDER_SITE_OTHER): Payer: Medicare Other | Admitting: Cardiovascular Disease

## 2016-06-24 DIAGNOSIS — I701 Atherosclerosis of renal artery: Secondary | ICD-10-CM

## 2016-06-24 DIAGNOSIS — I480 Paroxysmal atrial fibrillation: Secondary | ICD-10-CM

## 2016-06-24 DIAGNOSIS — I10 Essential (primary) hypertension: Secondary | ICD-10-CM | POA: Diagnosis not present

## 2016-06-24 DIAGNOSIS — E785 Hyperlipidemia, unspecified: Secondary | ICD-10-CM | POA: Diagnosis not present

## 2016-06-24 DIAGNOSIS — I209 Angina pectoris, unspecified: Secondary | ICD-10-CM

## 2016-06-24 DIAGNOSIS — I259 Chronic ischemic heart disease, unspecified: Secondary | ICD-10-CM

## 2016-06-24 MED ORDER — LOSARTAN POTASSIUM 50 MG PO TABS: 50.0000 mg | ORAL_TABLET | Freq: Every day | ORAL | 3 refills | Status: DC

## 2016-06-24 NOTE — Assessment & Plan Note (Signed)
History of PAF on Elliquis  oral anticoagulation.

## 2016-06-24 NOTE — Patient Instructions (Signed)
Medication Instructions: Taper off Clonidine--Take .1 mg every other day for one week. Then, take .1 mg every three days for one week. Then stop.  When completely off Clonidine--start Losartan 50 mg daily.    Follow-Up: Your physician recommends that you schedule a follow-up appointment in: 3 months with Dr. Gwenlyn Found.  If you need a refill on your cardiac medications before your next appointment, please call your pharmacy.

## 2016-06-24 NOTE — Assessment & Plan Note (Signed)
History of difficult to control hypertension on multiple antihypertensive medications including a calcium channel blocker (amlodipine 10 mg), high-dose beta blocker, diuretic, high-dose hydralazine and clonidine. Her blood pressure today is 183/68. One measured home is in the 160-170 range. She was symptomatic on higher dose hydralazine and his also symptomatic on clonidine. We have stopped her losartan because of moderate renal deficiency with a creatinine of 1.63 measured 03/24/16 falling to 1.41 off the ARB. I'm going to taper her off the clonidine because of side effects. Going to put her on a lower dose ARB (losartan 50 mg a day) and have her follow-up with Dr. Florene Glen.

## 2016-06-24 NOTE — Assessment & Plan Note (Signed)
History of this limited lipidemia on statin therapy with lipid profile performed 06/17/16 revealing LDL 72 and HDL 39.

## 2016-06-24 NOTE — Progress Notes (Signed)
06/24/2016 Valerie Terrell   May 19, 1932  [id number]  Primary Physician Tammi Sou, MD Primary Cardiologist: Lorretta Harp MD Renae Gloss  HPI:  Ms. Doffing is a pleasant 83 year old widowed Caucasian female mother of one daughter Valerie Terrell  who I last saw in the office 05/01/16.  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 She smoked remotely and drinks socially. She does have a history of difficult to control hypertension. Hyperlipidemia. She had a 2-D echo which was normal, a Myoview stress test which was normal and a CT angiogram performed 01/29/16 that showed no evidence of osseous stenosis. She does have difficult to control hypertension with a recent renal Doppler study performed 02/26/16 that showed an atrophic nonfunctioning left kidney and a right kidney of measuring 11 cm with at least a moderate right renal artery stenosis. Her serum creatinine in September was 1.15 and currently isn't elicited is in the 1.6 range potentially related to hay her ARB. Her ARB was stopped her serum creatinine fell to 1.4. She saw Dr. Erling Cruz, nephrology, who increased her hydralazine and clonidine. She was intolerant to her higher hydralazine dose and seems to be intolerant to the clonidine as well. She has moderate bilateral lower extremity edema possibly related to her amlodipine.   Current Outpatient Prescriptions  Medication Sig Dispense Refill  . amLODipine (NORVASC) 5 MG tablet Take 1 tablet (5 mg total) by mouth 2 (two) times daily with breakfast and lunch. 180 tablet 3  . apixaban (ELIQUIS) 5 MG TABS tablet Take 1 tablet (5 mg total) by mouth 2 (two) times daily. 180 tablet 3  . atenolol-chlorthalidone (TENORETIC) 100-25 MG tablet Take 1 tablet by mouth every morning.    . calcium carbonate (OS-CAL) 600 MG TABS tablet Take 600 mg by  mouth 2 (two) times daily.    . cloNIDine (CATAPRES) 0.1 MG tablet Take 1 tablet by mouth 3 (three) times daily.    . hydrALAZINE (APRESOLINE) 50 MG tablet Take 1 tablet (50 mg total) by mouth 3 (three) times daily. (Patient taking differently: Take 75 mg by mouth 3 (three) times daily. ) 270 tablet 2  . HYDROcodone-acetaminophen (NORCO) 5-325 MG tablet Take 1 tablet by mouth every 6 (six) hours as needed for moderate pain. 20 tablet 0  . Magnesium Gluconate (MAGNESIUM 27) 500 (27 Mg) MG TABS Take 500 mg by mouth daily.    . Multiple Vitamin (MULTIVITAMIN) tablet Take 1 tablet by mouth daily.    . pravastatin (PRAVACHOL) 40 MG tablet Take 40 mg by mouth every evening.    . vitamin C (ASCORBIC ACID) 500 MG tablet Take 500 mg by mouth daily.    [name] losartan (COZAAR) 50 MG tablet Take 1 tablet (50 mg total) by mouth daily. 90 tablet 3   No current facility-administered medications for this visit.     Allergies  Allergen Reactions  . Codeine Nausea Only  . Nickel Rash  . Sulfa Antibiotics Other (See Comments)    Reaction unknown    Social History   Social History  . Marital status: Widowed    Spouse name: N/A  . Number of children: N/A  . Years of education: N/A   Occupational History  . Not on file.   Social History Main Topics  . Smoking status: Never Smoker  . Smokeless tobacco: Never Used  .  Alcohol use 0.6 oz/week    1 Glasses of wine per week     Comment: wine  . Drug use: No  . Sexual activity: No   Other Topics Concern  . Not on file   Social History Narrative   Widow.  One daughter, lives with her.   Educ: college   Occup: retired Marine scientist.   No T/A/Ds.   She is almost a vegetarian.     Review of Systems: General: negative for chills, fever, night sweats or weight changes.  Cardiovascular: negative for chest pain, dyspnea on exertion, edema, orthopnea, palpitations, paroxysmal nocturnal dyspnea or shortness of breath Dermatological: negative for  rash Respiratory: negative for cough or wheezing Urologic: negative for hematuria Abdominal: negative for nausea, vomiting, diarrhea, bright red blood per rectum, melena, or hematemesis Neurologic: negative for visual changes, syncope, or dizziness All other systems reviewed and are otherwise negative except as noted above.    Blood pressure (!) 183/68, pulse 66, height 5' (1.524 m), weight 140 lb 6.4 oz (63.7 kg), SpO2 94 %.  General appearance: alert and no distress Neck: no adenopathy, no carotid bruit, no JVD, supple, symmetrical, trachea midline and thyroid not enlarged, symmetric, no tenderness/mass/nodules Lungs: clear to auscultation bilaterally Heart: regular rate and rhythm, S1, S2 normal, no murmur, click, rub or gallop Extremities: 1-2+ pitting edema bilaterally  EKG not performed today  ASSESSMENT AND PLAN:   PAF (paroxysmal atrial fibrillation) (HCC) History of PAF on Elliquis  oral anticoagulation.  Essential hypertension History of difficult to control hypertension on multiple antihypertensive medications including a calcium channel blocker (amlodipine 10 mg), high-dose beta blocker, diuretic, high-dose hydralazine and clonidine. Her blood pressure today is 183/68. One measured home is in the 160-170 range. She was symptomatic on higher dose hydralazine and his also symptomatic on clonidine. We have stopped her losartan because of moderate renal deficiency with a creatinine of 1.63 measured 03/24/16 falling to 1.41 off the ARB. I'm going to taper her off the clonidine because of side effects. Going to put her on a lower dose ARB (losartan 50 mg a day) and have her follow-up with Dr. Florene Glen.  Chest pain History of chest pain in the past with Myoview stress test performed 01/31/16 which was low risk.  Dyslipidemia History of this limited lipidemia on statin therapy with lipid profile performed 06/17/16 revealing LDL 72 and HDL 39.  Renal artery stenosis (HCC) History of a  small atrophic left kidney with absent left renal blood flow by duplex ultrasound performed in our office 02/26/16. Her right renal dimension was 11 cm pole to pole with mild to moderate right renal artery stenosis. A right renal aortic ratio was 3.22 suggesting less than a 50% stenosis.      Lorretta Harp MD FACP,FACC,FAHA, [facility] 06/24/2016 12:09 PM

## 2016-06-24 NOTE — Assessment & Plan Note (Signed)
History of a small atrophic left kidney with absent left renal blood flow by duplex ultrasound performed in our office 02/26/16. Her right renal dimension was 11 cm pole to pole with mild to moderate right renal artery stenosis. A right renal aortic ratio was 3.22 suggesting less than a 50% stenosis.

## 2016-06-24 NOTE — Assessment & Plan Note (Signed)
History of chest pain in the past with Myoview stress test performed 01/31/16 which was low risk.

## 2016-06-26 ENCOUNTER — Ambulatory Visit: Payer: Medicare Other | Admitting: Cardiovascular Disease

## 2016-06-26 ENCOUNTER — Ambulatory Visit
Admission: RE | Admit: 2016-06-26 | Discharge: 2016-06-26 | Disposition: A | Discharge status: Home / Self Care | Payer: Medicare Other | Source: Ambulatory Visit | Attending: Family Medicine | Admitting: Family Medicine

## 2016-06-26 DIAGNOSIS — Z1231 Encounter for screening mammogram for malignant neoplasm of breast: Secondary | ICD-10-CM

## 2016-06-29 ENCOUNTER — Other Ambulatory Visit: Payer: Self-pay | Admitting: Family Medicine

## 2016-06-29 DIAGNOSIS — N63 Unspecified lump in unspecified breast: Secondary | ICD-10-CM

## 2016-07-08 ENCOUNTER — Other Ambulatory Visit: Payer: Medicare Other

## 2016-07-08 ENCOUNTER — Telehealth: Payer: Self-pay | Admitting: Family Medicine

## 2016-07-08 ENCOUNTER — Telehealth: Payer: Self-pay | Admitting: *Deleted

## 2016-07-08 MED ORDER — ONDANSETRON HCL 4 MG PO TABS: 4.0000 mg | ORAL_TABLET | Freq: Three times a day (TID) | ORAL | 0 refills | Status: DC | PRN

## 2016-07-08 NOTE — Telephone Encounter (Signed)
zofran prescribed. Noted.

## 2016-07-08 NOTE — Telephone Encounter (Signed)
Please call pt: - we received notification of patient calling in secondary to sever diarrhea. She declined to go to ED or be seen in the office today and is asking for a medication.   - I do have great concern given her report of 10-15 stools a day and her chronic conditions. Dehydration would be a major concern in her with her kidney condition and she is on BP medications that can potentate acute kidney injury (arb and diuretic) with dehydration.   - By the phone call in to triage,  She has signs  of severe dehydration already and she is putting herself at risk and her remaining functioning  kidney by not going to the ED. I willing to call in zofran for nausea, to allow her to drink more water but I strongly urge her to go to ED for IVF and labs to monitor her kidney.   - Imodium is ok to use for severe diarrhea, but can prolong the course if this viral. Would not prescribe anything different for the diarrhea at this time.

## 2016-07-08 NOTE — Telephone Encounter (Signed)
Patient calling and asking to speak to Dr. Idelle Leech CMA - patient states that she has been having diarrhea for the last two days.  Says she can't eat or drink anything without it going straight through her.  She is wanting to know if there is anything he can call in for her or what she needs to do.   Patient aware that I am sending a message over and she will receive a call back.

## 2016-07-08 NOTE — Telephone Encounter (Signed)
SW pt and advised her that Dr. Anitra Lauth is out of the office today on vacation and will not be back til Friday (07/10/16). I advised her that she could come in to see our other provider Dr.Kuneff, pt stated that she couldn't leave her house because she has to go to the bathroom so often. I advised pt that I could transfer her call to our triage center where she can speak to a RN, pt agreed and call transferred.

## 2016-07-08 NOTE — Telephone Encounter (Signed)
Patient Name: Valerie Terrell  DOB: March 06, 1933    Initial Comment Caller states she has had severe diarrhea for 2 days.   Nurse Assessment  Nurse: Raphael Gibney, RN, Vanita Ingles Date/Time (Eastern Time): 07/08/2016 10:45:24 AM  Confirm and document reason for call. If symptomatic, describe symptoms. ---Caller states she has had severe diarrhea for 2 days. Has had 15-20 diarrhea stools. Had dry heaves this am but no vomiting. no fever Has used immodium which is not helping. She was awake most of the night due to diarrhea. Had cramps yesterday. She is urinating. Not drinking very much.  Does the patient have any new or worsening symptoms? ---Yes  Will a triage be completed? ---Yes  Related visit to physician within the last 2 weeks? ---No  Does the PT have any chronic conditions? (i.e. diabetes, asthma, etc.) ---Yes  List chronic conditions. ---atrial fib  Is this a behavioral health or substance abuse call? ---No     Guidelines    Guideline Title Affirmed Question Affirmed Notes  Diarrhea [1] SEVERE diarrhea (e.g., 7 or more times / day more than normal) AND [2] age > 49 years    Final Disposition User   See Physician within 4 Hours (or PCP triage) Raphael Gibney, RN, Vanita Ingles    Comments  Pt does not want to make appt today as she says the diarrhea is so severe she does not want to leave. she is wanting to know if she can get a prescription for the diarrhea. Please call pt back regarding prescription.   Referrals  GO TO FACILITY REFUSED   Disagree/Comply: Disagree  Disagree/Comply Reason: Disagree with instructions

## 2016-07-08 NOTE — Telephone Encounter (Signed)
Patient advised of information.  Strongly encouraged ED but patient continued to refuse.  She states she will just continue to try to drink more fluids.

## 2016-07-11 ENCOUNTER — Emergency Department (HOSPITAL_COMMUNITY): Payer: Medicare Other

## 2016-07-11 ENCOUNTER — Encounter (HOSPITAL_COMMUNITY): Payer: Self-pay | Admitting: Emergency Medicine

## 2016-07-11 ENCOUNTER — Inpatient Hospital Stay (HOSPITAL_COMMUNITY): Payer: Medicare Other

## 2016-07-11 ENCOUNTER — Inpatient Hospital Stay (HOSPITAL_COMMUNITY)
Admission: EM | Admit: 2016-07-11 | Discharge: 2016-07-20 | DRG: 683 | Disposition: A | Discharge status: Home / Self Care | Payer: Medicare Other | Attending: Internal Medicine | Admitting: Internal Medicine

## 2016-07-11 DIAGNOSIS — Z7901 Long term (current) use of anticoagulants: Secondary | ICD-10-CM

## 2016-07-11 DIAGNOSIS — Z8673 Personal history of transient ischemic attack (TIA), and cerebral infarction without residual deficits: Secondary | ICD-10-CM | POA: Diagnosis not present

## 2016-07-11 DIAGNOSIS — E8809 Other disorders of plasma-protein metabolism, not elsewhere classified: Secondary | ICD-10-CM | POA: Diagnosis not present

## 2016-07-11 DIAGNOSIS — Z885 Allergy status to narcotic agent status: Secondary | ICD-10-CM

## 2016-07-11 DIAGNOSIS — E785 Hyperlipidemia, unspecified: Secondary | ICD-10-CM | POA: Diagnosis present

## 2016-07-11 DIAGNOSIS — Z905 Acquired absence of kidney: Secondary | ICD-10-CM | POA: Diagnosis not present

## 2016-07-11 DIAGNOSIS — N183 Chronic kidney disease, stage 3 (moderate): Secondary | ICD-10-CM | POA: Diagnosis not present

## 2016-07-11 DIAGNOSIS — N170 Acute kidney failure with tubular necrosis: Secondary | ICD-10-CM | POA: Diagnosis not present

## 2016-07-11 DIAGNOSIS — J90 Pleural effusion, not elsewhere classified: Secondary | ICD-10-CM | POA: Diagnosis present

## 2016-07-11 DIAGNOSIS — E860 Dehydration: Secondary | ICD-10-CM | POA: Diagnosis not present

## 2016-07-11 DIAGNOSIS — Z86010 Personal history of colonic polyps: Secondary | ICD-10-CM

## 2016-07-11 DIAGNOSIS — Z8249 Family history of ischemic heart disease and other diseases of the circulatory system: Secondary | ICD-10-CM

## 2016-07-11 DIAGNOSIS — I480 Paroxysmal atrial fibrillation: Secondary | ICD-10-CM | POA: Diagnosis present

## 2016-07-11 DIAGNOSIS — R109 Unspecified abdominal pain: Secondary | ICD-10-CM | POA: Diagnosis not present

## 2016-07-11 DIAGNOSIS — N281 Cyst of kidney, acquired: Secondary | ICD-10-CM | POA: Diagnosis not present

## 2016-07-11 DIAGNOSIS — R600 Localized edema: Secondary | ICD-10-CM

## 2016-07-11 DIAGNOSIS — M5136 Other intervertebral disc degeneration, lumbar region: Secondary | ICD-10-CM | POA: Diagnosis present

## 2016-07-11 DIAGNOSIS — D631 Anemia in chronic kidney disease: Secondary | ICD-10-CM | POA: Diagnosis not present

## 2016-07-11 DIAGNOSIS — N179 Acute kidney failure, unspecified: Secondary | ICD-10-CM

## 2016-07-11 DIAGNOSIS — M5416 Radiculopathy, lumbar region: Secondary | ICD-10-CM | POA: Diagnosis present

## 2016-07-11 DIAGNOSIS — Z882 Allergy status to sulfonamides status: Secondary | ICD-10-CM

## 2016-07-11 DIAGNOSIS — I129 Hypertensive chronic kidney disease with stage 1 through stage 4 chronic kidney disease, or unspecified chronic kidney disease: Secondary | ICD-10-CM | POA: Diagnosis present

## 2016-07-11 DIAGNOSIS — E875 Hyperkalemia: Secondary | ICD-10-CM | POA: Diagnosis not present

## 2016-07-11 DIAGNOSIS — M8580 Other specified disorders of bone density and structure, unspecified site: Secondary | ICD-10-CM | POA: Diagnosis present

## 2016-07-11 DIAGNOSIS — M549 Dorsalgia, unspecified: Secondary | ICD-10-CM

## 2016-07-11 DIAGNOSIS — E8770 Fluid overload, unspecified: Secondary | ICD-10-CM | POA: Diagnosis present

## 2016-07-11 DIAGNOSIS — I10 Essential (primary) hypertension: Secondary | ICD-10-CM | POA: Diagnosis present

## 2016-07-11 DIAGNOSIS — Z91048 Other nonmedicinal substance allergy status: Secondary | ICD-10-CM

## 2016-07-11 DIAGNOSIS — R0602 Shortness of breath: Secondary | ICD-10-CM | POA: Diagnosis not present

## 2016-07-11 DIAGNOSIS — I517 Cardiomegaly: Secondary | ICD-10-CM | POA: Diagnosis not present

## 2016-07-11 DIAGNOSIS — M545 Low back pain: Secondary | ICD-10-CM | POA: Diagnosis not present

## 2016-07-11 LAB — BASIC METABOLIC PANEL
ANION GAP: 10 (ref 5–15)
BUN: 71 mg/dL — ABNORMAL HIGH (ref 6–20)
CHLORIDE: 104 mmol/L (ref 101–111)
CO2: 21 mmol/L — AB (ref 22–32)
Calcium: 7.9 mg/dL — ABNORMAL LOW (ref 8.9–10.3)
Creatinine, Ser: 4.24 mg/dL — ABNORMAL HIGH (ref 0.44–1.00)
GFR calc Af Amer: 10 mL/min — ABNORMAL LOW (ref >60)
GFR calc non Af Amer: 9 mL/min — ABNORMAL LOW (ref >60)
GLUCOSE: 114 mg/dL — AB (ref 65–99)
POTASSIUM: 4.3 mmol/L (ref 3.5–5.1)
Sodium: 135 mmol/L (ref 135–145)

## 2016-07-11 LAB — CBC WITH DIFFERENTIAL/PLATELET
BASOS ABS: 0 10*3/uL (ref 0.0–0.1)
Basophils Relative: 0 %
EOS PCT: 1 %
Eosinophils Absolute: 0.1 10*3/uL (ref 0.0–0.7)
HEMATOCRIT: 38.5 % (ref 36.0–46.0)
HEMOGLOBIN: 12.6 g/dL (ref 12.0–15.0)
LYMPHS ABS: 0.6 10*3/uL — AB (ref 0.7–4.0)
LYMPHS PCT: 7 %
MCH: 30.2 pg (ref 26.0–34.0)
MCHC: 32.7 g/dL (ref 30.0–36.0)
MCV: 92.3 fL (ref 78.0–100.0)
Monocytes Absolute: 1.3 10*3/uL — ABNORMAL HIGH (ref 0.1–1.0)
Monocytes Relative: 15 %
NEUTROS ABS: 6.6 10*3/uL (ref 1.7–7.7)
NEUTROS PCT: 77 %
PLATELETS: 279 10*3/uL (ref 150–400)
RBC: 4.17 MIL/uL (ref 3.87–5.11)
RDW: 14.8 % (ref 11.5–15.5)
WBC: 8.7 10*3/uL (ref 4.0–10.5)

## 2016-07-11 LAB — URINALYSIS, ROUTINE W REFLEX MICROSCOPIC
Bilirubin Urine: NEGATIVE
GLUCOSE, UA: NEGATIVE mg/dL
HGB URINE DIPSTICK: NEGATIVE
KETONES UR: NEGATIVE mg/dL
NITRITE: NEGATIVE
PROTEIN: NEGATIVE mg/dL
Specific Gravity, Urine: 1.015 (ref 1.005–1.030)
pH: 5 (ref 5.0–8.0)

## 2016-07-11 LAB — COMPREHENSIVE METABOLIC PANEL
ALK PHOS: 57 U/L (ref 38–126)
ALT: 17 U/L (ref 14–54)
AST: 25 U/L (ref 15–41)
Albumin: 2.7 g/dL — ABNORMAL LOW (ref 3.5–5.0)
Anion gap: 13 (ref 5–15)
BUN: 74 mg/dL — AB (ref 6–20)
CALCIUM: 8.2 mg/dL — AB (ref 8.9–10.3)
CHLORIDE: 100 mmol/L — AB (ref 101–111)
CO2: 22 mmol/L (ref 22–32)
CREATININE: 4.48 mg/dL — AB (ref 0.44–1.00)
GFR calc Af Amer: 10 mL/min — ABNORMAL LOW (ref >60)
GFR calc non Af Amer: 8 mL/min — ABNORMAL LOW (ref >60)
Glucose, Bld: 106 mg/dL — ABNORMAL HIGH (ref 65–99)
Potassium: 4.4 mmol/L (ref 3.5–5.1)
Sodium: 135 mmol/L (ref 135–145)
Total Bilirubin: 0.5 mg/dL (ref 0.3–1.2)
Total Protein: 5.9 g/dL — ABNORMAL LOW (ref 6.5–8.1)

## 2016-07-11 LAB — CREATININE, URINE, RANDOM: Creatinine, Urine: 118.41 mg/dL

## 2016-07-11 LAB — I-STAT CG4 LACTIC ACID, ED: LACTIC ACID, VENOUS: 0.67 mmol/L (ref 0.5–1.9)

## 2016-07-11 LAB — PROTIME-INR
INR: 1.35
Prothrombin Time: 16.8 seconds — ABNORMAL HIGH (ref 11.4–15.2)

## 2016-07-11 LAB — LIPASE, BLOOD: Lipase: 39 U/L (ref 11–51)

## 2016-07-11 MED ORDER — ONDANSETRON HCL 4 MG PO TABS
4.0000 mg | ORAL_TABLET | Freq: Four times a day (QID) | ORAL | Status: DC | PRN
  Administered 2016-07-11 – 2016-07-20 (×4): 4 mg via ORAL
  Filled 2016-07-11 (×4): qty 1

## 2016-07-11 MED ORDER — ONDANSETRON HCL 4 MG/2ML IJ SOLN
4.0000 mg | Freq: Once | INTRAMUSCULAR | Status: AC
  Administered 2016-07-11: 4 mg via INTRAVENOUS
  Filled 2016-07-11: qty 2

## 2016-07-11 MED ORDER — FENTANYL CITRATE (PF) 100 MCG/2ML IJ SOLN
50.0000 ug | Freq: Once | INTRAMUSCULAR | Status: AC
  Administered 2016-07-11: 50 ug via INTRAVENOUS
  Filled 2016-07-11: qty 2

## 2016-07-11 MED ORDER — APIXABAN 5 MG PO TABS: 5.0000 mg | ORAL_TABLET | Freq: Two times a day (BID) | ORAL | Status: DC

## 2016-07-11 MED ORDER — APIXABAN 2.5 MG PO TABS: 2.5000 mg | ORAL_TABLET | Freq: Two times a day (BID) | ORAL | Status: DC

## 2016-07-11 MED ORDER — PRAVASTATIN SODIUM 40 MG PO TABS
40.0000 mg | ORAL_TABLET | Freq: Every evening | ORAL | Status: DC
  Administered 2016-07-11 – 2016-07-19 (×9): 40 mg via ORAL
  Filled 2016-07-11 (×9): qty 1

## 2016-07-11 MED ORDER — ENSURE ENLIVE PO LIQD: 237.0000 mL | Freq: Two times a day (BID) | ORAL | Status: DC

## 2016-07-11 MED ORDER — APIXABAN 2.5 MG PO TABS
2.5000 mg | ORAL_TABLET | Freq: Two times a day (BID) | ORAL | Status: DC
  Administered 2016-07-11 – 2016-07-12 (×2): 2.5 mg via ORAL
  Filled 2016-07-11 (×2): qty 1

## 2016-07-11 MED ORDER — MAGNESIUM GLUCONATE 500 MG PO TABS
500.0000 mg | ORAL_TABLET | ORAL | Status: DC
  Administered 2016-07-12 – 2016-07-19 (×5): 500 mg via ORAL
  Filled 2016-07-11 (×5): qty 1

## 2016-07-11 MED ORDER — ONDANSETRON HCL 4 MG/2ML IJ SOLN
4.0000 mg | Freq: Four times a day (QID) | INTRAMUSCULAR | Status: DC | PRN
  Administered 2016-07-12 – 2016-07-19 (×5): 4 mg via INTRAVENOUS
  Filled 2016-07-11 (×5): qty 2

## 2016-07-11 MED ORDER — HYDROCODONE-ACETAMINOPHEN 5-325 MG PO TABS
1.0000 | ORAL_TABLET | Freq: Four times a day (QID) | ORAL | Status: DC | PRN
  Administered 2016-07-11 – 2016-07-15 (×9): 1 via ORAL
  Filled 2016-07-11 (×9): qty 1

## 2016-07-11 MED ORDER — AMLODIPINE BESYLATE 5 MG PO TABS
5.0000 mg | ORAL_TABLET | Freq: Two times a day (BID) | ORAL | Status: DC
  Administered 2016-07-12 – 2016-07-20 (×18): 5 mg via ORAL
  Filled 2016-07-11 (×18): qty 1

## 2016-07-11 MED ORDER — ONE-DAILY MULTI VITAMINS PO TABS: 1.0000 | ORAL_TABLET | ORAL | Status: DC

## 2016-07-11 MED ORDER — LOSARTAN POTASSIUM 50 MG PO TABS
50.0000 mg | ORAL_TABLET | Freq: Every day | ORAL | Status: DC
  Administered 2016-07-12: 50 mg via ORAL
  Filled 2016-07-11: qty 1

## 2016-07-11 MED ORDER — FENTANYL CITRATE (PF) 100 MCG/2ML IJ SOLN
25.0000 ug | INTRAMUSCULAR | Status: DC | PRN
  Administered 2016-07-12: 25 ug via INTRAVENOUS
  Administered 2016-07-12 (×2): 50 ug via INTRAVENOUS
  Administered 2016-07-12: 25 ug via INTRAVENOUS
  Administered 2016-07-13 – 2016-07-15 (×5): 50 ug via INTRAVENOUS
  Filled 2016-07-11 (×9): qty 2

## 2016-07-11 MED ORDER — HYDRALAZINE HCL 25 MG PO TABS
75.0000 mg | ORAL_TABLET | Freq: Three times a day (TID) | ORAL | Status: DC
  Administered 2016-07-11 – 2016-07-20 (×27): 75 mg via ORAL
  Filled 2016-07-11 (×27): qty 3

## 2016-07-11 MED ORDER — SODIUM CHLORIDE 0.9 % IV BOLUS (SEPSIS)
1000.0000 mL | Freq: Once | INTRAVENOUS | Status: AC
  Administered 2016-07-11: 1000 mL via INTRAVENOUS

## 2016-07-11 MED ORDER — SODIUM CHLORIDE 0.9 % IV SOLN
INTRAVENOUS | Status: DC
  Administered 2016-07-11 – 2016-07-12 (×2): via INTRAVENOUS
  Administered 2016-07-12: 75 mL/h via INTRAVENOUS

## 2016-07-11 MED ORDER — ALBUTEROL SULFATE (2.5 MG/3ML) 0.083% IN NEBU: 2.5000 mg | INHALATION_SOLUTION | RESPIRATORY_TRACT | Status: DC | PRN

## 2016-07-11 NOTE — ED Notes (Signed)
Pt returned from CT, requesting pain medicine.

## 2016-07-11 NOTE — H&P (Addendum)
History and Physical    Valerie Terrell KPT:[id number] DOB: 01/23/1933 DOA: 07/11/2016  Referring MD/NP/PA: Dr. Sherry Ruffing PCP: Tammi Sou, MD  Patient coming from:  Home  Chief Complaint:  Back pain  HPI: Valerie Terrell is a 83 y.o. female with medical history significant of PAF on Eliquis, solitary kidney, HTN, HLD, CVA; who presents with back pain. Symptoms started acutely 2 days ago. Patient reports pain is sharp and stabbing in nature worsened with any movement. Patient tried taking home hydrocodone which she has for a recently fractured foot without relief of symptoms.  Earlier in the week patient reports having severe diarrhea with anywhere from 5-10 bowel movements per day which resolved approximately 4 days ago. Associated symptoms include generalized fatigue, generalized weakness, decreased appetite, nausea, dry heaves, and worsening lower extremity swelling  . Otherwise about 1 month ago Dr. Florene Glen patient's nephrologist had reportedly placed on clonidine but the patient did not tolerate this well and losartan 50 mg daily was added back on. At home patient reports systolic blood pressures have been ranging from 160s to 180s generally. She denies any chest pain, shortness of breath,  loss of consciousness, abdominal pain, or focal weakness.  ED Course: Upon admission into the emergency department patient was seen have full signs relatively within normal limits. Lab work revealed BUN 74 and creatinine 4.48(baseline  Cr 1.01-1.52). CT scan of the abdomen showed no signs of obstruction or signs or infection. Patient was given 50 mg of fentanyl and 1 L of NS IVF in the ED.   Review of Systems: As per HPI otherwise 10 point review of systems negative.   Past Medical History:  Diagnosis Date  . Atrophy of left kidney    with absent blood flow by renal artery dopplers (Dr. Gwenlyn Found)  . 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 Chronic renal insufficiency, stage 2 (mild)    GFR @ 60.   01/2016 renal u/s showed atrophic/hypoplastic left kidney.  No hydronephrosis.  Small right renal cyst.  . History of adenomatous polyp of colon   . History of subarachnoid hemorrhage 10/2014   after syncope and while on xarelto  . Hyperlipidemia   . Hypertension    Difficult to control, in the setting of one functioning kidney: pt was referred to nephrology by Dr. Gwenlyn Found 06/2015.  . Lumbar radiculopathy 2012  . Metatarsal fracture 06/10/2016   Nondisplaced, left 5th metatarsal--pt was referred to ortho  . Osteopenia 2014   T-score -2.1  . PAF (paroxysmal atrial fibrillation) (HCC)    Eliquis started after BRAO and CVA  . Stroke [facility])    cardioembolic (had CVA while on no anticoag)--"scattered subacute punctate infarcts: 1 in R parietal lobe and 2 in occipital cortex" on MRI br.  CT angio head/neck: aortic arch athero    Past Surgical History:  Procedure Laterality Date  . APPENDECTOMY  child  . CARDIOVASCULAR STRESS TEST  01/31/2016   Stress myoview: NORMAL/Low risk.  EF 56%.  . [place]  . COLONOSCOPY  2015   + hx of adenomatous polyps.  Need digest health spec in [facility] records to see when pt due for next colonoscopy  . Renal artery dopplers  02/26/2016   Her right renal dimension was 11 cm pole to pole with mild to moderate right renal artery stenosis. A right renal aortic ratio was 3.22 suggesting less than a 50% stenosis.  . TONSILLECTOMY    . TRANSTHORACIC ECHOCARDIOGRAM  01/29/2016   EF 55-60%,  normal LV wall motion, grade I DD.  No cardiac source of emboli was seen.     reports that she has never smoked. She has never used smokeless tobacco. She reports that she drinks about 0.6 oz of alcohol per week . She reports that she does not use drugs.  Allergies  Allergen Reactions  . Clonidine Derivatives Palpitations and Other (See Comments)    Very sedated  . Codeine Nausea Only  . Nickel Rash  . Sulfa Antibiotics Other (See Comments)    Reaction unknown     Family History  Problem Relation Age of Onset  . Heart disease Father   . Early death Father   . Sudden Cardiac Death Neg Hx     Prior to Admission medications   Medication Sig Start Date End Date Taking? Authorizing Provider  acetaminophen (TYLENOL) 500 MG tablet Take 500 mg by mouth every 6 (six) hours as needed for moderate pain.   Yes Historical Provider, MD  amLODipine (NORVASC) 5 MG tablet Take 1 tablet (5 mg total) by mouth 2 (two) times daily with breakfast and lunch. 05/25/16  Yes Lorretta Harp, MD  apixaban (ELIQUIS) 5 MG TABS tablet Take 1 tablet (5 mg total) by mouth 2 (two) times daily. 02/12/16  Yes Luke K Kilroy, PA-C  atenolol-chlorthalidone (TENORETIC) 100-25 MG tablet Take 1 tablet by mouth every morning. 01/27/16  Yes Historical Provider, MD  Calcium Carb-Cholecalciferol (CALCIUM/VITAMIN D PO) Take 1 tablet by mouth 4 (four) times a week.   Yes Historical Provider, MD  hydrALAZINE (APRESOLINE) 50 MG tablet Take 1 tablet (50 mg total) by mouth 3 (three) times daily. Patient taking differently: Take 75 mg by mouth 3 (three) times daily.  05/01/16 07/30/16 Yes Lorretta Harp, MD  HYDROcodone-acetaminophen (NORCO) 5-325 MG tablet Take 1 tablet by mouth every 6 (six) hours as needed for moderate pain. 06/10/16  Yes Miguel Joellen Jersey, MD  losartan (COZAAR) 50 MG tablet Take 1 tablet (50 mg total) by mouth daily. 06/24/16 09/22/16 Yes Lorretta Harp, MD  Magnesium Gluconate (MAGNESIUM 27) 500 (27 Mg) MG TABS Take 500 mg by mouth 4 (four) times a week.    Yes Historical Provider, MD  Multiple Vitamin (MULTIVITAMIN) tablet Take 1 tablet by mouth 3 (three) times a week.    Yes Historical Provider, MD  ondansetron (ZOFRAN) 4 MG tablet Take 1 tablet (4 mg total) by mouth every 8 (eight) hours as needed for nausea or vomiting. Patient taking differently: Take 2-4 mg by mouth every 8 (eight) hours as needed for nausea or vomiting.  07/08/16  Yes Renee A Kuneff, DO  pravastatin  (PRAVACHOL) 40 MG tablet Take 40 mg by mouth every evening. 07/16/15  Yes Historical Provider, MD  vitamin C (ASCORBIC ACID) 500 MG tablet Take 500 mg by mouth daily.   Yes Historical Provider, MD    Physical Exam:  Constitutional: Elderly female who appears to be in moderate discomfort with movement. Vitals:   07/11/16 1045 07/11/16 1115 07/11/16 1147 07/11/16 1245  BP: (!) 133/53 140/60 144/81 (!) 125/53  Pulse: 64 62 65 62  Resp: 19 18 16 14   Temp:      TempSrc:      SpO2: 92% 96% 95% 96%   Eyes: PERRL, lids and conjunctivae normal ENMT: Mucous membranes are dry. Posterior pharynx clear of any exudate or lesions. Neck: normal, supple, no masses, no thyromegaly Respiratory: clear to auscultation bilaterally, no wheezing, no crackles. Normal respiratory effort. No accessory muscle  use.  Cardiovascular: Regular rate and rhythm, no murmurs / rubs / gallops. +1-2 pitting extremity edema. 2+ pedal pulses. No carotid bruits.  Abdomen: no tenderness, no masses palpated. No hepatosplenomegaly. Bowel sounds positive.  Musculoskeletal: no clubbing / cyanosis. No joint deformity upper and lower extremities. Good ROM, no contractures. Normal muscle tone.  Skin: no rashes, lesions, ulcers. No induration Neurologic: CN 2-12 grossly intact. Sensation intact, DTR normal. Strength 5/5 in all 4.  Psychiatric: Normal judgment and insight. Alert and oriented x 3. Normal mood.     Labs on Admission: I have personally reviewed following labs and imaging studies  CBC:  Recent Labs Lab 07/11/16 0853  WBC 8.7  NEUTROABS 6.6  HGB 12.6  HCT 38.5  MCV 92.3  PLT 884   Basic Metabolic Panel:  Recent Labs Lab 07/11/16 0853  NA 135  K 4.4  CL 100*  CO2 22  GLUCOSE 106*  BUN 74*  CREATININE 4.48*  CALCIUM 8.2*   GFR: CrCl cannot be calculated (Unknown ideal weight.). Liver Function Tests:  Recent Labs Lab 07/11/16 0853  AST 25  ALT 17  ALKPHOS 57  BILITOT 0.5  PROT 5.9*  ALBUMIN  2.7*    Recent Labs Lab 07/11/16 0853  LIPASE 39   No results for input(s): AMMONIA in the last 168 hours. Coagulation Profile:  Recent Labs Lab 07/11/16 0853  INR 1.35   Cardiac Enzymes: No results for input(s): CKTOTAL, CKMB, CKMBINDEX, TROPONINI in the last 168 hours. BNP (last 3 results) No results for input(s): PROBNP in the last 8760 hours. HbA1C: No results for input(s): HGBA1C in the last 72 hours. CBG: No results for input(s): GLUCAP in the last 168 hours. Lipid Profile: No results for input(s): CHOL, HDL, LDLCALC, TRIG, CHOLHDL, LDLDIRECT in the last 72 hours. Thyroid Function Tests: No results for input(s): TSH, T4TOTAL, FREET4, T3FREE, THYROIDAB in the last 72 hours. Anemia Panel: No results for input(s): VITAMINB12, FOLATE, FERRITIN, TIBC, IRON, RETICCTPCT in the last 72 hours. Urine analysis:    Component Value Date/Time   COLORURINE YELLOW 07/11/2016 [place] 07/11/2016 1105   LABSPEC 1.015 07/11/2016 1105   PHURINE 5.0 07/11/2016 1105   GLUCOSEU NEGATIVE 07/11/2016 1105   HGBUR NEGATIVE 07/11/2016 1105   [place] 07/11/2016 1105   [place] 07/11/2016 1105   PROTEINUR NEGATIVE 07/11/2016 1105   NITRITE NEGATIVE 07/11/2016 1105   LEUKOCYTESUR SMALL (A) 07/11/2016 1105   Sepsis Labs: No results found for this or any previous visit (from the past 240 hour(s)).   Radiological Exams on Admission: Ct Abdomen Pelvis Wo Contrast  Result Date: 07/11/2016 CLINICAL DATA:  Back pain, right CVA pain, history of stones. EXAM: CT ABDOMEN AND PELVIS WITHOUT CONTRAST TECHNIQUE: Multidetector CT imaging of the abdomen and pelvis was performed following the standard protocol without IV contrast. COMPARISON:  None. FINDINGS: Lower chest: Small left pleural effusion and compressive atelectasis in the left lower lobe. Heart is enlarged. Coronary artery calcification. Small amount of pericardial fluid may be physiologic. Distal  esophagus is grossly unremarkable. Hepatobiliary: Faint 11 mm low-attenuation lesion in the dome of the liver is too small to characterize. Liver is otherwise unremarkable. There may be sludge in the gallbladder. No biliary ductal dilatation. Pancreas: Negative. Spleen: Negative. Adrenals/Urinary Tract: Adrenal glands are unremarkable. 1.5 cm low-attenuation lesion in the right kidney is difficult to further characterize without IV contrast. There may be a punctate stone in a markedly atrophic left kidney. Ureters are decompressed. Bladder is  low in volume. Stomach/Bowel: Stomach, small bowel and colon are unremarkable. Appendix is not readily visualized. Vascular/Lymphatic: Atherosclerotic calcification of the arterial vasculature without abdominal aortic aneurysm. Porta hepatis, retroperitoneal and mesenteric lymph nodes measure up to 10 mm. Reproductive: No adnexal mass. Other: Small periumbilical and bilateral inguinal hernias contain fat. Musculoskeletal: No worrisome lytic or sclerotic lesions. IMPRESSION: 1. No findings to explain the patient's symptoms. 2. Tiny left pleural effusion with compressive atelectasis left lower lobe. 3. Borderline porta hepatis, retroperitoneal and mesenteric lymph nodes, nonspecific. 4.  Aortic atherosclerosis (ICD10-170.0). 5. Possible gallbladder sludge. 6. Possible punctate stone in an atrophic left kidney. Electronically Signed   By: Lorin Picket M.D.   On: 07/11/2016 12:02   Dg Chest 2 View  Result Date: 07/11/2016 CLINICAL DATA:  83 year old female with a 2 day history of pleuritic back pain, weakness and shortness of breath EXAM: CHEST  2 VIEW COMPARISON:  Prior chest x-ray 02/27/2016 FINDINGS: Stable cardiomegaly. Atherosclerotic calcifications are present in the transverse aorta. Mild pulmonary vascular congestion without overt edema. Interval development of a small layering left pleural effusion and associated left basilar opacity which is favored to reflect  atelectasis. Atherosclerotic calcifications are present in the transverse aorta. No acute osseous abnormality. IMPRESSION: 1. New development of a small left layering pleural effusion with associated left basilar opacity likely reflecting atelectasis. 2. Stable cardiomegaly. 3. Mild vascular congestion without overt edema. 4.  Aortic Atherosclerosis (ICD10-170.0). Electronically Signed   By: Jacqulynn Cadet M.D.   On: 07/11/2016 09:33      Assessment/Plan Acute renal failure on chronic kidney disease stage III/ solitary kidney:Patient reports recent diarrhea with continued use of diuretics for blood pressure. Suspect symptoms likely prerenal in nature. Presents with creatinine 4.48 and BUN 74. Patient baseline creatinine previously thought to be 1.01-1.23. Urinalysis appears to likely be contaminated. CT scan of the abdomen shows possibility of a 1.5 poorly attenuated lesion in the right kidney, but no acute obstruction. Patient given 1 liter of NS IVF in the ED. patient sees Dr. Florene Glen of nephrology. - Admit to a MedSurg bed - Strict ins and outs  - check FeUr - Follow-up urine culture - IVF NS @ 75 ml/hr - Check renal ultrasound - Hold nephrotoxic agents - Consider need of consultation to Dr. Florene Glen nephrology  Flank pain: Acute. Question cause at this time unsure if related to acute renal failure or kidney injury. Patient given fentanyl in the ED - Fentanyl prn pain  Paroxysmal atrial fibrillation:  chadsvasc score = 5 currently rate controlled. - Continue Eliquis adjusted dose secondary to kidney function - Will need to adjust glucose dosage if kidney function improves  Essential hypertension - Continue amlodipine, losartan, and hydralazine - Held atenolol-chlorthalidone 2/2 acute renal injury  History of CVA -  continue to monitor  Hypoalbuminemia: Patient albumin 2.7 on admission. Suspect that this could be contributing to patient's edema symptoms. - Check prealbumin in  a.m.  DVT prophylaxis: Continue Eliquis Code Status: Full Family Communication:  Discussed plan of care with the patient and family present at bedside  Disposition Plan:  Possible discharge home once medically stable and kidney function improving. Consults called: none Admission status: Inpatient   Norval Morton MD [facility] Pager [phone]  If 7PM-7AM, please contact night-coverage [email or web address] Password [email or web address]  07/11/2016, 1:27 PM

## 2016-07-11 NOTE — ED Provider Notes (Signed)
[facility] DEPT Provider Note   CSN: [id number] Arrival date & time: 07/11/16  0805    History   Chief Complaint Chief Complaint  Patient presents with  . Back Pain    HPI Valerie Terrell is a 83 y.o. female     The history is provided by the patient and a relative.  Back Pain   This is a new problem. The current episode started yesterday. The problem occurs constantly. The problem has been gradually worsening. The pain is associated with no known injury. The pain is present in the lumbar spine. The quality of the pain is described as aching. The pain does not radiate. The pain is at a severity of 10/10. The pain is severe. The symptoms are aggravated by certain positions and bending. Pertinent negatives include no chest pain, no fever, no headaches, no abdominal pain, no dysuria, no pelvic pain, no leg pain and no weakness. She has tried nothing for the symptoms.    Past Medical History:  Diagnosis Date  . Atrophy of left kidney    with absent blood flow by renal artery dopplers (Dr. Gwenlyn Found)  . 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 Chronic renal insufficiency, stage 2 (mild)    GFR @ 60.  01/2016 renal u/s showed atrophic/hypoplastic left kidney.  No hydronephrosis.  Small right renal cyst.  . History of adenomatous polyp of colon   . History of subarachnoid hemorrhage 10/2014   after syncope and while on xarelto  . Hyperlipidemia   . Hypertension    Difficult to control, in the setting of one functioning kidney: pt was referred to nephrology by Dr. Gwenlyn Found 06/2015.  . Lumbar radiculopathy 2012  . Metatarsal fracture 06/10/2016   Nondisplaced, left 5th metatarsal--pt was referred to ortho  . Osteopenia 2014   T-score -2.1  . PAF (paroxysmal atrial fibrillation) (HCC)    Eliquis started after BRAO and CVA  . Stroke [facility])    cardioembolic (had CVA while on no anticoag)--"scattered subacute punctate infarcts: 1 in R parietal lobe and 2 in occipital cortex" on MRI  br.  CT angio head/neck: aortic arch athero    Patient Active Problem List   Diagnosis Date Noted  . Renal artery stenosis ([place]) 06/24/2016  . Nondisplaced fracture of fifth metatarsal bone, left foot, initial encounter for closed fracture 06/10/2016  . Rib contusion, left, initial encounter 06/10/2016  . Acute on chronic renal insufficiency 03/13/2016  . Single kidney 03/13/2016  . Chest pain   . Dyslipidemia   . PAF (paroxysmal atrial fibrillation) ([place]) 01/28/2016  . Essential hypertension 01/28/2016  . Cardioembolic stroke ([place]) 50/01/3817  . 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 Personal history of subarachnoid hemorrhage 01/28/2016  . CKD (chronic kidney disease), stage II 01/28/2016    Past Surgical History:  Procedure Laterality Date  . APPENDECTOMY  child  . CARDIOVASCULAR STRESS TEST  01/31/2016   Stress myoview: NORMAL/Low risk.  EF 56%.  . [place]  . COLONOSCOPY  2015   + hx of adenomatous polyps.  Need digest health spec in [facility] records to see when pt due for next colonoscopy  . Renal artery dopplers  02/26/2016   Her right renal dimension was 11 cm pole to pole with mild to moderate right renal artery stenosis. A right renal aortic ratio was 3.22 suggesting less than a 50% stenosis.  . TONSILLECTOMY    . TRANSTHORACIC ECHOCARDIOGRAM  01/29/2016   EF 55-60%, normal  LV wall motion, grade I DD.  No cardiac source of emboli was seen.    OB History    No data available       Home Medications    Prior to Admission medications   Medication Sig Start Date End Date Taking? Authorizing Provider  amLODipine (NORVASC) 5 MG tablet Take 1 tablet (5 mg total) by mouth 2 (two) times daily with breakfast and lunch. 05/25/16   Lorretta Harp, MD  apixaban (ELIQUIS) 5 MG TABS tablet Take 1 tablet (5 mg total) by mouth 2 (two) times daily. 02/12/16   Erlene Quan, PA-C  atenolol-chlorthalidone (TENORETIC) 100-25 MG tablet Take 1 tablet by  mouth every morning. 01/27/16   Historical Provider, MD  calcium carbonate (OS-CAL) 600 MG TABS tablet Take 600 mg by mouth 2 (two) times daily.    Historical Provider, MD  hydrALAZINE (APRESOLINE) 50 MG tablet Take 1 tablet (50 mg total) by mouth 3 (three) times daily. Patient taking differently: Take 75 mg by mouth 3 (three) times daily.  05/01/16 07/30/16  Lorretta Harp, MD  HYDROcodone-acetaminophen (NORCO) 5-325 MG tablet Take 1 tablet by mouth every 6 (six) hours as needed for moderate pain. 06/10/16   Horald Pollen, MD  losartan (COZAAR) 50 MG tablet Take 1 tablet (50 mg total) by mouth daily. 06/24/16 09/22/16  Lorretta Harp, MD  Magnesium Gluconate (MAGNESIUM 27) 500 (27 Mg) MG TABS Take 500 mg by mouth daily.    Historical Provider, MD  Multiple Vitamin (MULTIVITAMIN) tablet Take 1 tablet by mouth daily.    Historical Provider, MD  ondansetron (ZOFRAN) 4 MG tablet Take 1 tablet (4 mg total) by mouth every 8 (eight) hours as needed for nausea or vomiting. 07/08/16   Renee A Kuneff, DO  pravastatin (PRAVACHOL) 40 MG tablet Take 40 mg by mouth every evening. 07/16/15   Historical Provider, MD  vitamin C (ASCORBIC ACID) 500 MG tablet Take 500 mg by mouth daily.    Historical Provider, MD    Family History Family History  Problem Relation Age of Onset  . Heart disease Father   . Early death Father   . Sudden Cardiac Death Neg Hx     Social History Social History  Substance Use Topics  . Smoking status: Never Smoker  . Smokeless tobacco: Never Used  . Alcohol use 0.6 oz/week    1 Glasses of wine per week     Comment: wine     Allergies   Codeine; Nickel; and Sulfa antibiotics   Review of Systems Review of Systems  Constitutional: Positive for fatigue. Negative for appetite change, chills, diaphoresis and fever.  HENT: Negative for congestion.   Eyes: Negative for visual disturbance.  Respiratory: Negative for cough, chest tightness, shortness of breath, wheezing and  stridor.   Cardiovascular: Negative for chest pain.  Gastrointestinal: Positive for diarrhea (last week), nausea and vomiting. Negative for abdominal distention, abdominal pain and constipation.  Genitourinary: Positive for decreased urine volume and flank pain. Negative for dysuria and pelvic pain.  Musculoskeletal: Positive for back pain. Negative for neck pain and neck stiffness.  Skin: Negative for rash and wound.  Neurological: Negative for weakness, light-headedness and headaches.  Psychiatric/Behavioral: Negative for agitation.  All other systems reviewed and are negative.    Physical Exam Updated Vital Signs BP 165/64   Pulse 69   Temp 97.3 F (36.3 C) (Oral)   Resp 16   SpO2 96%   Physical Exam  Constitutional: She  is oriented to person, place, and time. She appears well-developed and well-nourished. No distress.  HENT:  Head: Normocephalic and atraumatic.  Right Ear: External ear normal.  Left Ear: External ear normal.  Nose: Nose normal.  Mouth/Throat: Oropharynx is clear and moist. No oropharyngeal exudate.  Eyes: Conjunctivae and EOM are normal. Pupils are equal, round, and reactive to light.  Neck: Normal range of motion. Neck supple.  Cardiovascular: Normal rate, normal heart sounds and intact distal pulses.   No murmur heard. Pulmonary/Chest: Effort normal. No stridor. No respiratory distress. She has rales (mild and low).  Abdominal: She exhibits no distension. There is no tenderness. There is no rebound.  Musculoskeletal: She exhibits edema. She exhibits no tenderness.  Neurological: She is alert and oriented to person, place, and time. She has normal reflexes. No sensory deficit. She exhibits normal muscle tone.  Skin: Skin is warm. No rash noted. She is not diaphoretic. No erythema.  Psychiatric: She has a normal mood and affect.  Nursing note and vitals reviewed.    ED Treatments / Results  Labs (all labs ordered are listed, but only abnormal results  are displayed) Labs Reviewed  CBC WITH DIFFERENTIAL/PLATELET - Abnormal; Notable for the following:       Result Value   Lymphs Abs 0.6 (*)    Monocytes Absolute 1.3 (*)    All other components within normal limits  COMPREHENSIVE METABOLIC PANEL - Abnormal; Notable for the following:    Chloride 100 (*)    Glucose, Bld 106 (*)    BUN 74 (*)    Creatinine, Ser 4.48 (*)    Calcium 8.2 (*)    Total Protein 5.9 (*)    Albumin 2.7 (*)    GFR calc non Af Amer 8 (*)    GFR calc Af Amer 10 (*)    All other components within normal limits  PROTIME-INR - Abnormal; Notable for the following:    Prothrombin Time 16.8 (*)    All other components within normal limits  URINALYSIS, ROUTINE W REFLEX MICROSCOPIC - Abnormal; Notable for the following:    Leukocytes, UA SMALL (*)    Bacteria, UA RARE (*)    Squamous Epithelial / LPF 6-30 (*)    All other components within normal limits  BASIC METABOLIC PANEL - Abnormal; Notable for the following:    CO2 21 (*)    Glucose, Bld 114 (*)    BUN 71 (*)    Creatinine, Ser 4.24 (*)    Calcium 7.9 (*)    GFR calc non Af Amer 9 (*)    GFR calc Af Amer 10 (*)    All other components within normal limits  URINE CULTURE  LIPASE, BLOOD  CREATININE, URINE, RANDOM  UREA NITROGEN, URINE  CBC  BASIC METABOLIC PANEL  PREALBUMIN  I-STAT CG4 LACTIC ACID, ED  I-STAT CG4 LACTIC ACID, ED    EKG  EKG Interpretation None       Radiology Ct Abdomen Pelvis Wo Contrast  Result Date: 07/11/2016 CLINICAL DATA:  Back pain, right CVA pain, history of stones. EXAM: CT ABDOMEN AND PELVIS WITHOUT CONTRAST TECHNIQUE: Multidetector CT imaging of the abdomen and pelvis was performed following the standard protocol without IV contrast. COMPARISON:  None. FINDINGS: Lower chest: Small left pleural effusion and compressive atelectasis in the left lower lobe. Heart is enlarged. Coronary artery calcification. Small amount of pericardial fluid may be physiologic. Distal  esophagus is grossly unremarkable. Hepatobiliary: Faint 11 mm low-attenuation lesion in the  dome of the liver is too small to characterize. Liver is otherwise unremarkable. There may be sludge in the gallbladder. No biliary ductal dilatation. Pancreas: Negative. Spleen: Negative. Adrenals/Urinary Tract: Adrenal glands are unremarkable. 1.5 cm low-attenuation lesion in the right kidney is difficult to further characterize without IV contrast. There may be a punctate stone in a markedly atrophic left kidney. Ureters are decompressed. Bladder is low in volume. Stomach/Bowel: Stomach, small bowel and colon are unremarkable. Appendix is not readily visualized. Vascular/Lymphatic: Atherosclerotic calcification of the arterial vasculature without abdominal aortic aneurysm. Porta hepatis, retroperitoneal and mesenteric lymph nodes measure up to 10 mm. Reproductive: No adnexal mass. Other: Small periumbilical and bilateral inguinal hernias contain fat. Musculoskeletal: No worrisome lytic or sclerotic lesions. IMPRESSION: 1. No findings to explain the patient's symptoms. 2. Tiny left pleural effusion with compressive atelectasis left lower lobe. 3. Borderline porta hepatis, retroperitoneal and mesenteric lymph nodes, nonspecific. 4.  Aortic atherosclerosis (ICD10-170.0). 5. Possible gallbladder sludge. 6. Possible punctate stone in an atrophic left kidney. Electronically Signed   By: Lorin Picket M.D.   On: 07/11/2016 12:02   Dg Chest 2 View  Result Date: 07/11/2016 CLINICAL DATA:  83 year old female with a 2 day history of pleuritic back pain, weakness and shortness of breath EXAM: CHEST  2 VIEW COMPARISON:  Prior chest x-ray 02/27/2016 FINDINGS: Stable cardiomegaly. Atherosclerotic calcifications are present in the transverse aorta. Mild pulmonary vascular congestion without overt edema. Interval development of a small layering left pleural effusion and associated left basilar opacity which is favored to reflect  atelectasis. Atherosclerotic calcifications are present in the transverse aorta. No acute osseous abnormality. IMPRESSION: 1. New development of a small left layering pleural effusion with associated left basilar opacity likely reflecting atelectasis. 2. Stable cardiomegaly. 3. Mild vascular congestion without overt edema. 4.  Aortic Atherosclerosis (ICD10-170.0). Electronically Signed   By: Jacqulynn Cadet M.D.   On: 07/11/2016 09:33   US Renal  Result Date: 07/11/2016 CLINICAL DATA:  Right flank pain history of chronic renal insufficiency EXAM: RENAL / URINARY TRACT ULTRASOUND COMPLETE COMPARISON:  CT 07/11/2016, renal ultrasound 01/29/2016 FINDINGS: Right Kidney: Length: 11.2 cm. Echogenicity within normal limits. No hydronephrosis. 1.9 x 1.5 x 1.7 cm cyst mid right kidney. Left Kidney: Length: Not clearly visualized. Bladder: Poorly visualized, and is likely empty. Incidental note made of fatty liver. IMPRESSION: 1. 1.9 cm cyst in the right kidney.  No right hydronephrosis 2. Left kidney is not clearly visualized, this may be secondary to marked atrophy. 3. Bladder not well visualized and is likely empty. Electronically Signed   By: Donavan Foil M.D.   On: 07/11/2016 18:21    Procedures Procedures (including critical care time)  Medications Ordered in ED Medications  HYDROcodone-acetaminophen (NORCO/VICODIN) 5-325 MG per tablet 1 tablet (1 tablet Oral Given 07/11/16 1456)  amLODipine (NORVASC) tablet 5 mg (not administered)  hydrALAZINE (APRESOLINE) tablet 75 mg (75 mg Oral Given 07/11/16 1726)  Magnesium Gluconate TABS 500 mg (not administered)  pravastatin (PRAVACHOL) tablet 40 mg (40 mg Oral Given 07/11/16 1727)  ondansetron (ZOFRAN) tablet 4 mg (not administered)    Or  ondansetron (ZOFRAN) injection 4 mg (not administered)  albuterol (PROVENTIL) (2.5 MG/3ML) 0.083% nebulizer solution 2.5 mg (not administered)  losartan (COZAAR) tablet 50 mg (not administered)  0.9 %  sodium chloride  infusion ( Intravenous New Bag/Given 07/11/16 1653)  fentaNYL (SUBLIMAZE) injection 25-50 mcg (not administered)  feeding supplement (ENSURE ENLIVE) (ENSURE ENLIVE) liquid 237 mL (237 mLs Oral Not Given 07/11/16 1630)  apixaban (ELIQUIS) tablet 2.5 mg (not administered)  fentaNYL (SUBLIMAZE) injection 50 mcg (50 mcg Intravenous Given 07/11/16 0857)  ondansetron (ZOFRAN) injection 4 mg (4 mg Intravenous Given 07/11/16 0857)  sodium chloride 0.9 % bolus 1,000 mL (0 mLs Intravenous Stopped 07/11/16 1049)  ondansetron (ZOFRAN) injection 4 mg (4 mg Intravenous Given 07/11/16 1004)  fentaNYL (SUBLIMAZE) injection 50 mcg (50 mcg Intravenous Given 07/11/16 1147)     Initial Impression / Assessment and Plan / ED Course  I have reviewed the triage vital signs and the nursing notes.  Pertinent labs & imaging results that were available during my care of the patient were reviewed by me and considered in my medical decision making (see chart for details).     Valerie Terrell is a 83 y.o. female With a past medical history significant for hypertension, hyperlipidemia, Kidney stones, CVA, atrial fibrillation on Eliquis, and chronic kidney disease with one atrophic kidney who presents with right sided low back pain, Decreased urination, nausea, vomiting, lower extremity edema, and fatigue. Patient is accompanied by family who report that patient had gradual onset of severe back pain beginning yesterday with no traumatic injuries. Patient reports urine is decreased. Patient denies dysuria, hematuria, Constipation, or abdominal pain. Patient does report diarrhea last week that is resolved. Patient reports generalized fatigue, and nausea with vomiting.  History and exam are seen above. Patient had mild crackles in lung bases. Bilateral lower extremity edema. Surprisingly, no back tenderness on exam. Exam otherwise unremarkable.  Patient found to have no evidence of right-sided nephrolithiasis to cause the pain however,  she was found to have acute kidney injury with a jump in creatinine to 4.48. Patient also found to have Plural effusions.  Patient given fluids, pain medicine, and nausea medicine.  Patient admitted for further management of acute kidney injury and severe pain. Patient admitted in stable condition.    Final Clinical Impressions(s) / ED Diagnoses   Final diagnoses:  Right flank pain  AKI (acute kidney injury) ([place])     Clinical Impression: 1. AKI (acute kidney injury) ([place])   2. Right flank pain     Disposition: Admit to [facility], MD 07/11/16 2000

## 2016-07-11 NOTE — ED Notes (Signed)
Pt to CT

## 2016-07-11 NOTE — ED Triage Notes (Signed)
Pt states c/o severe back pain since yesterday, pt c/o midline thoracic pain. Denies falls or injury to back. Denies issues with urination.

## 2016-07-11 NOTE — ED Notes (Signed)
Pt taken to restroom via wheelchair.

## 2016-07-11 NOTE — ED Notes (Signed)
Pt aware that urine sample is needed, however pt is unable to provide one at this time

## 2016-07-11 NOTE — Discharge Instructions (Signed)

## 2016-07-11 NOTE — Progress Notes (Signed)
Patient arrived on unit via stretcher from ED.  Daughter at bedside.

## 2016-07-12 ENCOUNTER — Inpatient Hospital Stay (HOSPITAL_COMMUNITY): Payer: Medicare Other

## 2016-07-12 LAB — URINE CULTURE: CULTURE: NO GROWTH

## 2016-07-12 LAB — BASIC METABOLIC PANEL
ANION GAP: 10 (ref 5–15)
BUN: 69 mg/dL — ABNORMAL HIGH (ref 6–20)
CALCIUM: 7.8 mg/dL — AB (ref 8.9–10.3)
CO2: 19 mmol/L — AB (ref 22–32)
Chloride: 106 mmol/L (ref 101–111)
Creatinine, Ser: 4.08 mg/dL — ABNORMAL HIGH (ref 0.44–1.00)
GFR, EST AFRICAN AMERICAN: 11 mL/min — AB (ref >60)
GFR, EST NON AFRICAN AMERICAN: 9 mL/min — AB (ref >60)
GLUCOSE: 96 mg/dL (ref 65–99)
POTASSIUM: 4.5 mmol/L (ref 3.5–5.1)
Sodium: 135 mmol/L (ref 135–145)

## 2016-07-12 LAB — CBC
HEMATOCRIT: 36.3 % (ref 36.0–46.0)
Hemoglobin: 11.6 g/dL — ABNORMAL LOW (ref 12.0–15.0)
MCH: 29.8 pg (ref 26.0–34.0)
MCHC: 32 g/dL (ref 30.0–36.0)
MCV: 93.3 fL (ref 78.0–100.0)
Platelets: 272 10*3/uL (ref 150–400)
RBC: 3.89 MIL/uL (ref 3.87–5.11)
RDW: 14.8 % (ref 11.5–15.5)
WBC: 8 10*3/uL (ref 4.0–10.5)

## 2016-07-12 LAB — PREALBUMIN: Prealbumin: 15.7 mg/dL — ABNORMAL LOW (ref 18–38)

## 2016-07-12 LAB — UREA NITROGEN, URINE: UREA NITROGEN UR: 485 mg/dL

## 2016-07-12 LAB — BRAIN NATRIURETIC PEPTIDE: B NATRIURETIC PEPTIDE 5: 857.9 pg/mL — AB (ref 0.0–100.0)

## 2016-07-12 LAB — TROPONIN I: Troponin I: <0.03 ng/mL (ref <0.03)

## 2016-07-12 MED ORDER — FUROSEMIDE 10 MG/ML IJ SOLN
40.0000 mg | Freq: Every day | INTRAMUSCULAR | Status: DC
  Administered 2016-07-13: 40 mg via INTRAVENOUS
  Filled 2016-07-12: qty 4

## 2016-07-12 MED ORDER — FUROSEMIDE 10 MG/ML IJ SOLN: 20.0000 mg | Freq: Every day | INTRAMUSCULAR | Status: DC

## 2016-07-12 MED ORDER — POLYETHYLENE GLYCOL 3350 17 G PO PACK
17.0000 g | PACK | Freq: Every day | ORAL | Status: DC | PRN
  Administered 2016-07-12: 17 g via ORAL
  Filled 2016-07-12: qty 1

## 2016-07-12 MED ORDER — SENNOSIDES-DOCUSATE SODIUM 8.6-50 MG PO TABS
1.0000 | ORAL_TABLET | Freq: Every evening | ORAL | Status: DC | PRN
  Administered 2016-07-12 – 2016-07-19 (×4): 1 via ORAL
  Filled 2016-07-12 (×5): qty 1

## 2016-07-12 MED ORDER — HEPARIN (PORCINE) IN NACL 100-0.45 UNIT/ML-% IJ SOLN
800.0000 [IU]/h | INTRAMUSCULAR | Status: DC
  Administered 2016-07-12: 800 [IU]/h via INTRAVENOUS
  Filled 2016-07-12: qty 250

## 2016-07-12 MED ORDER — FUROSEMIDE 10 MG/ML IJ SOLN
40.0000 mg | Freq: Once | INTRAMUSCULAR | Status: AC
  Administered 2016-07-12: 40 mg via INTRAVENOUS
  Filled 2016-07-12: qty 4

## 2016-07-12 NOTE — Progress Notes (Signed)
PROGRESS NOTE  Valerie Terrell  QMV:[id number] DOB: Jul 01, 1932  DOA: 07/11/2016 PCP: Tammi Sou, MD   Brief Narrative:  Valerie Terrell is a 83 y.o. female with medical history significant of PAF on Eliquis, solitary kidney, HTN, HLD, CVA; who presented with 2 day history of back pain. She has had episodes of diarrhea and weakness with generalized weakness, nausea and decreased appetite. ED evaluation revealed elevated BUN and creatinine with negative CT findings. Submitted for further management of acute on chronic renal failure Assessment & Plan:   Principal Problem:   Acute renal failure superimposed on chronic kidney disease ([place]) Active Problems:   PAF (paroxysmal atrial fibrillation) (HCC)   Essential hypertension   Single kidney   Flank pain   Hypoalbuminemia  #1 Acute on CKD3: Due to dehydration with continued ACE inhibitor intake IV hydration Hold nephrotoxins Renal ultrasound  #2 Dehydration: IV hydration Follow renal function with fluid balance  #3 PAF: chadsvasc score 5 Rate controlled Continue Eliquis  #4 Back pain/Flank Pain: Musculoskeletal Pain control      DVT prophylaxis: Eliquis Code Status: Full code Family Communication: Patient and daughter at bedside Disposition Plan:    Consultants:   Procedures:    Antimicrobials:   Subjective: Patient was minerals. Review medication reviewed. Discussed with patient and daughter as well as RN at bedside regarding pain management. No fever or chills, no vomiting  Objective:  Vitals:   07/11/16 1839 07/11/16 2127 07/12/16 0553 07/12/16 0922  BP:  (!) 152/55 (!) 148/80 (!) 157/51  Pulse:  65 66 66  Resp:  18 18 18   Temp:   98.7 F (37.1 C) 97.6 F (36.4 C)  TempSrc:  Oral Oral Oral  SpO2:  93% 94% 92%  Weight: 67.3 kg (148 lb 6.4 oz) 67.3 kg (148 lb 5.9 oz)    Height:        Intake/Output Summary (Last 24 hours) at 07/12/16 1442 Last data filed at 07/12/16 1000  Gross per 24 hour    Intake          1733.75 ml  Output              600 ml  Net          1133.75 ml   Filed Weights   07/11/16 1839 07/11/16 2127  Weight: 67.3 kg (148 lb 6.4 oz) 67.3 kg (148 lb 5.9 oz)    Examination:  General exam: No acute distress HEENT: Dry mucous membranes Respiratory system: Clear to auscultation. Respiratory effort normal. Cardiovascular system: S1 & S2 heard, RRR. No JVD, murmurs, rubs, gallops or clicks. No pedal edema. Gastrointestinal system: Abdomen is nondistended, soft and nontender. No organomegaly or masses felt. Normal bowel sounds heard. Central nervous system: Alert and oriented. No focal neurological deficits. Extremities: Symmetric 5 x 5 power. Skin: No rashes, lesions or ulcers Psychiatry: Judgement and insight appear normal. Mood & affect appropriate.     Data Reviewed: I have personally reviewed following labs and imaging studies  CBC:  Recent Labs Lab 07/11/16 0853 07/12/16 0552  WBC 8.7 8.0  NEUTROABS 6.6  --   HGB 12.6 11.6*  HCT 38.5 36.3  MCV 92.3 93.3  PLT 279 284   Basic Metabolic Panel:  Recent Labs Lab 07/11/16 0853 07/11/16 1640 07/12/16 0552  NA 135 135 135  K 4.4 4.3 4.5  CL 100* 104 106  CO2 22 21* 19*  GLUCOSE 106* 114* 96  BUN 74* 71* 69*  CREATININE 4.48* 4.24* 4.08*  CALCIUM  8.2* 7.9* 7.8*   GFR: Estimated Creatinine Clearance: 9.3 mL/min (by C-G formula based on SCr of 4.08 mg/dL (H)). Liver Function Tests:  Recent Labs Lab 07/11/16 0853  AST 25  ALT 17  ALKPHOS 57  BILITOT 0.5  PROT 5.9*  ALBUMIN 2.7*    Recent Labs Lab 07/11/16 0853  LIPASE 39   No results for input(s): AMMONIA in the last 168 hours. Coagulation Profile:  Recent Labs Lab 07/11/16 0853  INR 1.35   Cardiac Enzymes: No results for input(s): CKTOTAL, CKMB, CKMBINDEX, TROPONINI in the last 168 hours. BNP (last 3 results) No results for input(s): PROBNP in the last 8760 hours. HbA1C: No results for input(s): HGBA1C in the last  72 hours. CBG: No results for input(s): GLUCAP in the last 168 hours. Lipid Profile: No results for input(s): CHOL, HDL, LDLCALC, TRIG, CHOLHDL, LDLDIRECT in the last 72 hours. Thyroid Function Tests: No results for input(s): TSH, T4TOTAL, FREET4, T3FREE, THYROIDAB in the last 72 hours. Anemia Panel: No results for input(s): VITAMINB12, FOLATE, FERRITIN, TIBC, IRON, RETICCTPCT in the last 72 hours.  Sepsis Labs:  Recent Labs Lab 07/11/16 0853 07/11/16 0905 07/12/16 0552  WBC 8.7  --  8.0  LATICACIDVEN  --  0.67  --     Recent Results (from the past 240 hour(s))  Urine culture     Status: None   Collection Time: 07/11/16 11:05 AM  Result Value Ref Range Status   Specimen Description URINE, RANDOM  Final   Special Requests NONE  Final   Culture NO GROWTH  Final   Report Status 07/12/2016 FINAL  Final         Radiology Studies: Ct Abdomen Pelvis Wo Contrast  Result Date: 07/11/2016 CLINICAL DATA:  Back pain, right CVA pain, history of stones. EXAM: CT ABDOMEN AND PELVIS WITHOUT CONTRAST TECHNIQUE: Multidetector CT imaging of the abdomen and pelvis was performed following the standard protocol without IV contrast. COMPARISON:  None. FINDINGS: Lower chest: Small left pleural effusion and compressive atelectasis in the left lower lobe. Heart is enlarged. Coronary artery calcification. Small amount of pericardial fluid may be physiologic. Distal esophagus is grossly unremarkable. Hepatobiliary: Faint 11 mm low-attenuation lesion in the dome of the liver is too small to characterize. Liver is otherwise unremarkable. There may be sludge in the gallbladder. No biliary ductal dilatation. Pancreas: Negative. Spleen: Negative. Adrenals/Urinary Tract: Adrenal glands are unremarkable. 1.5 cm low-attenuation lesion in the right kidney is difficult to further characterize without IV contrast. There may be a punctate stone in a markedly atrophic left kidney. Ureters are decompressed. Bladder is  low in volume. Stomach/Bowel: Stomach, small bowel and colon are unremarkable. Appendix is not readily visualized. Vascular/Lymphatic: Atherosclerotic calcification of the arterial vasculature without abdominal aortic aneurysm. Porta hepatis, retroperitoneal and mesenteric lymph nodes measure up to 10 mm. Reproductive: No adnexal mass. Other: Small periumbilical and bilateral inguinal hernias contain fat. Musculoskeletal: No worrisome lytic or sclerotic lesions. IMPRESSION: 1. No findings to explain the patient's symptoms. 2. Tiny left pleural effusion with compressive atelectasis left lower lobe. 3. Borderline porta hepatis, retroperitoneal and mesenteric lymph nodes, nonspecific. 4.  Aortic atherosclerosis (ICD10-170.0). 5. Possible gallbladder sludge. 6. Possible punctate stone in an atrophic left kidney. Electronically Signed   By: Lorin Picket M.D.   On: 07/11/2016 12:02   Dg Chest 2 View  Result Date: 07/11/2016 CLINICAL DATA:  83 year old female with a 2 day history of pleuritic back pain, weakness and shortness of breath EXAM: CHEST  2  VIEW COMPARISON:  Prior chest x-ray 02/27/2016 FINDINGS: Stable cardiomegaly. Atherosclerotic calcifications are present in the transverse aorta. Mild pulmonary vascular congestion without overt edema. Interval development of a small layering left pleural effusion and associated left basilar opacity which is favored to reflect atelectasis. Atherosclerotic calcifications are present in the transverse aorta. No acute osseous abnormality. IMPRESSION: 1. New development of a small left layering pleural effusion with associated left basilar opacity likely reflecting atelectasis. 2. Stable cardiomegaly. 3. Mild vascular congestion without overt edema. 4.  Aortic Atherosclerosis (ICD10-170.0). Electronically Signed   By: Jacqulynn Cadet M.D.   On: 07/11/2016 09:33   US Renal  Result Date: 07/11/2016 CLINICAL DATA:  Right flank pain history of chronic renal insufficiency  EXAM: RENAL / URINARY TRACT ULTRASOUND COMPLETE COMPARISON:  CT 07/11/2016, renal ultrasound 01/29/2016 FINDINGS: Right Kidney: Length: 11.2 cm. Echogenicity within normal limits. No hydronephrosis. 1.9 x 1.5 x 1.7 cm cyst mid right kidney. Left Kidney: Length: Not clearly visualized. Bladder: Poorly visualized, and is likely empty. Incidental note made of fatty liver. IMPRESSION: 1. 1.9 cm cyst in the right kidney.  No right hydronephrosis 2. Left kidney is not clearly visualized, this may be secondary to marked atrophy. 3. Bladder not well visualized and is likely empty. Electronically Signed   By: Donavan Foil M.D.   On: 07/11/2016 18:21        Scheduled Meds: . amLODipine  5 mg Oral BID WC  . apixaban  2.5 mg Oral BID  . feeding supplement (ENSURE ENLIVE)  237 mL Oral BID BM  . hydrALAZINE  75 mg Oral TID  . losartan  50 mg Oral Daily  . magnesium gluconate  500 mg Oral Once per day on Sun Tue Thu Sat  . pravastatin  40 mg Oral QPM   Continuous Infusions: . sodium chloride 75 mL/hr (07/12/16 0610)     LOS: 1 day    Time spent: 30 min    OSEI-BONSU,Tashiya Souders, MD [facility] Pager [phone]  If 7PM-7AM, please contact night-coverage [email or web address] Password [email or web address] 07/12/2016, 2:42 PM

## 2016-07-12 NOTE — Progress Notes (Signed)
Pt was seen again on rounds this evening. She is on 75cc/hr IVF and has had some peripheral edema with dimished BS at bases, no acute distress. No c/o Chest pains or sob but wants som,ething for constipation.  Imp is early fluid overload - cut back on IVF to 50 cc/h; mayhave to stop. F/U cxr and BNP.Check Troponin and consider 2D echo as neededbased on results

## 2016-07-12 NOTE — Progress Notes (Signed)
ANTICOAGULATION CONSULT NOTE - Initial Consult  Pharmacy Consult for heparin Indication: atrial fibrillation  Patient Measurements: Height: 5' (152.4 cm) Weight: 148 lb 5.9 oz (67.3 kg) IBW/kg (Calculated) : 45.5 Heparin Dosing Weight: 60kg  Labs:  Recent Labs  07/11/16 0853 07/11/16 1640 07/12/16 0552  HGB 12.6  --  11.6*  HCT 38.5  --  36.3  PLT 279  --  272  LABPROT 16.8*  --   --   INR 1.35  --   --   CREATININE 4.48* 4.24* 4.08*    Estimated Creatinine Clearance: 9.3 mL/min (by C-G formula based on SCr of 4.08 mg/dL (H)).  Assessment: 83yo F on PTA Eliquis for afib admitted with AKI. Pharmacy recommended to dose heparin while patient has renal failure (Eliquis for afib not studied in [facility] < 25 ml/min). Last dose of Eliquis today at 0900. Will correlate aPTT and heparin levels. Hgb down 11.6, Plt wnl, no bleeding documented.  Goal of Therapy:  aPTT 66-102s Heparin level 0.3-0.7 units/ml Monitor platelets by anticoagulation protocol: Yes   Plan:  No bolus Start heparin 800 units/hr at 2100 8 hour aPTT Daily heparin level, aPTT, CBC Monitor s/sx of bleeding   Gwenlyn Perking, PharmD PGY1 Pharmacy Resident Pager: [phone] 07/12/2016 3:41 PM

## 2016-07-13 LAB — TROPONIN I
Troponin I: <0.03 ng/mL (ref <0.03)
Troponin I: <0.03 ng/mL (ref <0.03)

## 2016-07-13 LAB — BASIC METABOLIC PANEL
ANION GAP: 10 (ref 5–15)
BUN: 65 mg/dL — ABNORMAL HIGH (ref 6–20)
CO2: 19 mmol/L — ABNORMAL LOW (ref 22–32)
Calcium: 7.9 mg/dL — ABNORMAL LOW (ref 8.9–10.3)
Chloride: 104 mmol/L (ref 101–111)
Creatinine, Ser: 3.98 mg/dL — ABNORMAL HIGH (ref 0.44–1.00)
GFR calc Af Amer: 11 mL/min — ABNORMAL LOW (ref >60)
GFR, EST NON AFRICAN AMERICAN: 10 mL/min — AB (ref >60)
GLUCOSE: 88 mg/dL (ref 65–99)
POTASSIUM: 4.2 mmol/L (ref 3.5–5.1)
Sodium: 133 mmol/L — ABNORMAL LOW (ref 135–145)

## 2016-07-13 LAB — MAGNESIUM: MAGNESIUM: 1.7 mg/dL (ref 1.7–2.4)

## 2016-07-13 LAB — CBC WITH DIFFERENTIAL/PLATELET
BASOS ABS: 0 10*3/uL (ref 0.0–0.1)
Basophils Relative: 0 %
Eosinophils Absolute: 0.1 10*3/uL (ref 0.0–0.7)
Eosinophils Relative: 1 %
HEMATOCRIT: 36.9 % (ref 36.0–46.0)
Hemoglobin: 11.8 g/dL — ABNORMAL LOW (ref 12.0–15.0)
LYMPHS ABS: 0.9 10*3/uL (ref 0.7–4.0)
LYMPHS PCT: 12 %
MCH: 29.7 pg (ref 26.0–34.0)
MCHC: 32 g/dL (ref 30.0–36.0)
MCV: 92.9 fL (ref 78.0–100.0)
MONO ABS: 0.9 10*3/uL (ref 0.1–1.0)
MONOS PCT: 13 %
NEUTROS ABS: 5.4 10*3/uL (ref 1.7–7.7)
Neutrophils Relative %: 74 %
Platelets: 290 10*3/uL (ref 150–400)
RBC: 3.97 MIL/uL (ref 3.87–5.11)
RDW: 15 % (ref 11.5–15.5)
WBC: 7.3 10*3/uL (ref 4.0–10.5)

## 2016-07-13 LAB — PREALBUMIN: Prealbumin: 15.6 mg/dL — ABNORMAL LOW (ref 18–38)

## 2016-07-13 LAB — APTT
APTT: 120 s — AB (ref 24–36)
aPTT: 84 seconds — ABNORMAL HIGH (ref 24–36)
aPTT: 70 seconds — ABNORMAL HIGH (ref 24–36)

## 2016-07-13 LAB — HEPARIN LEVEL (UNFRACTIONATED): Heparin Unfractionated: >2.2 IU/mL — ABNORMAL HIGH (ref 0.30–0.70)

## 2016-07-13 LAB — BRAIN NATRIURETIC PEPTIDE: B Natriuretic Peptide: 926.6 pg/mL — ABNORMAL HIGH (ref 0.0–100.0)

## 2016-07-13 MED ORDER — HEPARIN (PORCINE) IN NACL 100-0.45 UNIT/ML-% IJ SOLN
800.0000 [IU]/h | INTRAMUSCULAR | Status: DC
  Administered 2016-07-14: 700 [IU]/h via INTRAVENOUS
  Administered 2016-07-15: 850 [IU]/h via INTRAVENOUS
  Administered 2016-07-17: 800 [IU]/h via INTRAVENOUS
  Filled 2016-07-13 (×4): qty 250

## 2016-07-13 MED ORDER — FUROSEMIDE 10 MG/ML IJ SOLN
80.0000 mg | Freq: Two times a day (BID) | INTRAMUSCULAR | Status: DC
  Administered 2016-07-13 – 2016-07-14 (×2): 80 mg via INTRAVENOUS
  Filled 2016-07-13 (×2): qty 8

## 2016-07-13 MED ORDER — METHOCARBAMOL 750 MG PO TABS
750.0000 mg | ORAL_TABLET | Freq: Three times a day (TID) | ORAL | Status: AC
  Administered 2016-07-13 – 2016-07-14 (×3): 750 mg via ORAL
  Filled 2016-07-13 (×2): qty 1
  Filled 2016-07-13: qty 1.5
  Filled 2016-07-13: qty 1
  Filled 2016-07-13: qty 2

## 2016-07-13 MED ORDER — NEPRO/CARBSTEADY PO LIQD
237.0000 mL | Freq: Two times a day (BID) | ORAL | Status: DC
  Administered 2016-07-13 – 2016-07-18 (×5): 237 mL via ORAL

## 2016-07-13 NOTE — Progress Notes (Addendum)
PROGRESS NOTE [facility]   Valerie Terrell   LKG:[id number] DOB: 10-12-1932  DOA: 07/11/2016 PCP: Tammi Sou, MD   Brief Narrative:  83-year-old female with medical history significant of PAF on pelvic wheeze, solitary kidney, hypertension, hyperlipidemia, CVA presented back pain that started today prior to admission. She also report that had some episodes of diarrhea and generalized weakness a week prior to admission. In ED evaluation revealed elevated BUN and creatinine, she is admitted for acute on chronic renal failure. 6 x-ray show pleural effusions with increasing vascular markings and cardiomegaly.  Subjective: Patient seen and examined this Am, report minor improvement after lasix, pain is less but still present. C/w SOB and leg swelling. Denies chest pain or palpitations   Assessment & Plan: Acute on CKD3 - Baseline Cr 1.4 - on chart review patient seem to be fluid overload rather than dehydrate on initial CXR she had new pleural effusion with increase in vascular markings and cardiomegaly. Patient with 2+ LE pitting edema. ? Cardiorenal syndrome  Will d/c IVF  Patient was started on lasix on 2/25 and Cr continues to improve will increase lasix to 80mg  BID  Avoid nephrotoxins Renal US no hydronephrosis  Will get 2D ECHO  Nephrology consulted   PAF: chadsvascscore 5 Rate controlled Continue Eliquis  Back pain - likely MSK although improving since diuretics, patient have a left pleural effusion where the pain is located Add Robaxin  Pain control PRN  HTN BP stable  Holding losartan and chlorthalidone  Continue Norvasc, hydralazine, patient also on Lasix  Monitor BP closely   DVT prophylaxis: Eliquis  Code Status: FULL  Family Communication: None at bedside  Disposition Plan: Anticipate discharge back to home when Cr improve.    Consultants:   Nephrology - Dr. Justin Mend   Procedures:   Renal US   Antimicrobials:  None     Objective: Vitals:   07/13/16 0905 07/13/16 1018 07/13/16 1227 07/13/16 1604  BP: (!) 133/59 (!) 156/57 (!) 142/43 (!) 157/65  Pulse: 68 66  69  Resp: 18     Temp: 98 F (36.7 C)     TempSrc: Oral     SpO2: 98%     Weight:      Height:        Intake/Output Summary (Last 24 hours) at 07/13/16 1831 Last data filed at 07/13/16 1611  Gross per 24 hour  Intake             1220 ml  Output             1025 ml  Net              195 ml   Filed Weights   07/11/16 1839 07/11/16 2127 07/12/16 2148  Weight: 67.3 kg (148 lb 6.4 oz) 67.3 kg (148 lb 5.9 oz) 65.2 kg (143 lb 11.8 oz)    Examination:  General exam: NAD on O2 [place]  HEENT: AC/AT, PERRLA, OP moist and clear Respiratory system: Decrease breath sounds, b/l crackles at the bases mor prominent in the left side Cardiovascular system: S1S2 RRR +SM  Gastrointestinal system: Abdomen is nondistended, soft and nontender.  Central nervous system: Alert and oriented. Extremities: 2+ pitting pedal edema.  Skin: No rashes Psychiatry: Judgement and insight appear normal. Mood & affect appropriate.    Data Reviewed: I have personally reviewed following labs and imaging studies  CBC:  Recent Labs Lab 07/11/16 0853 07/12/16 0552 07/13/16 0747  WBC 8.7 8.0 7.3  NEUTROABS 6.6  --  5.4  HGB 12.6 11.6* 11.8*  HCT 38.5 36.3 36.9  MCV 92.3 93.3 92.9  PLT 279 272 595   Basic Metabolic Panel:  Recent Labs Lab 07/11/16 0853 07/11/16 1640 07/12/16 0552 07/13/16 0747 07/13/16 1334  NA 135 135 135 133*  --   K 4.4 4.3 4.5 4.2  --   CL 100* 104 106 104  --   CO2 22 21* 19* 19*  --   GLUCOSE 106* 114* 96 88  --   BUN 74* 71* 69* 65*  --   CREATININE 4.48* 4.24* 4.08* 3.98*  --   CALCIUM 8.2* 7.9* 7.8* 7.9*  --   MG  --   --   --   --  1.7   GFR: Estimated Creatinine Clearance: 9.3 mL/min (by C-G formula based on SCr of 3.98 mg/dL (H)). Liver Function Tests:  Recent Labs Lab 07/11/16 0853  AST 25  ALT 17  ALKPHOS 57  BILITOT 0.5  PROT 5.9*    ALBUMIN 2.7*    Recent Labs Lab 07/11/16 0853  LIPASE 39   No results for input(s): AMMONIA in the last 168 hours. Coagulation Profile:  Recent Labs Lab 07/11/16 0853  INR 1.35   Cardiac Enzymes:  Recent Labs Lab 07/12/16 1900 07/13/16 0144 07/13/16 0747  TROPONINI <0.03 <0.03 <0.03   BNP (last 3 results) No results for input(s): PROBNP in the last 8760 hours. HbA1C: No results for input(s): HGBA1C in the last 72 hours. CBG: No results for input(s): GLUCAP in the last 168 hours. Lipid Profile: No results for input(s): CHOL, HDL, LDLCALC, TRIG, CHOLHDL, LDLDIRECT in the last 72 hours. Thyroid Function Tests: No results for input(s): TSH, T4TOTAL, FREET4, T3FREE, THYROIDAB in the last 72 hours. Anemia Panel: No results for input(s): VITAMINB12, FOLATE, FERRITIN, TIBC, IRON, RETICCTPCT in the last 72 hours. Sepsis Labs:  Recent Labs Lab 07/11/16 6387  LATICACIDVEN 0.67    Recent Results (from the past 240 hour(s))  Urine culture     Status: None   Collection Time: 07/11/16 11:05 AM  Result Value Ref Range Status   Specimen Description URINE, RANDOM  Final   Special Requests NONE  Final   Culture NO GROWTH  Final   Report Status 07/12/2016 FINAL  Final      Radiology Studies: Dg Chest Port 1 View  Result Date: 07/12/2016 CLINICAL DATA:  Fluid retention and legs. EXAM: PORTABLE CHEST 1 VIEW COMPARISON:  07/11/2016 FINDINGS: There is moderate cardiac enlargement. Aortic atherosclerosis noted. Small to moderate left pleural effusion is again identified. No airspace opacities. IMPRESSION: Similar appearance of cardiac enlargement, aortic atherosclerosis and left pleural effusion. Electronically Signed   By: Kerby Moors M.D.   On: 07/12/2016 18:43    Scheduled Meds: . amLODipine  5 mg Oral BID WC  . feeding supplement (NEPRO CARB STEADY)  237 mL Oral BID BM  . furosemide  80 mg Intravenous BID  . hydrALAZINE  75 mg Oral TID  . magnesium gluconate  500 mg  Oral Once per day on Sun Tue Thu Sat  . pravastatin  40 mg Oral QPM   Continuous Infusions: . heparin 700 Units/hr (07/13/16 1748)     LOS: 2 days    Chipper Oman, MD Pager: Text Page via [email or web address]  [phone]  If 7PM-7AM, please contact night-coverage [email or web address] Password [facility] 07/13/2016, 6:31 PM

## 2016-07-13 NOTE — Progress Notes (Signed)
ANTICOAGULATION CONSULT NOTE - Follow Up Consult  Pharmacy Consult for heparin Indication: atrial fibrillation  Allergies  Allergen Reactions  . Clonidine Derivatives Palpitations and Other (See Comments)    Very sedated  . Codeine Nausea Only  . Nickel Rash  . Sulfa Antibiotics Other (See Comments)    Reaction unknown    Patient Measurements: Height: 5' (152.4 cm) Weight: 139 lb 5.3 oz (63.2 kg) IBW/kg (Calculated) : 45.5  Vital Signs: Temp: 97.7 F (36.5 C) (02/26 2100) Temp Source: Oral (02/26 2100) BP: 141/56 (02/26 2100) Pulse Rate: 71 (02/26 2100)  Labs:  Recent Labs  07/11/16 0853 07/11/16 1640 07/12/16 0552 07/12/16 1900 07/13/16 0144 07/13/16 0747 07/13/16 1334 07/13/16 2252  HGB 12.6  --  11.6*  --   --  11.8*  --   --   HCT 38.5  --  36.3  --   --  36.9  --   --   PLT 279  --  272  --   --  290  --   --   APTT  --   --   --   --   --  84* 120* 70*  LABPROT 16.8*  --   --   --   --   --   --   --   INR 1.35  --   --   --   --   --   --   --   HEPARINUNFRC  --   --   --   --  >2.20*  --   --   --   CREATININE 4.48* 4.24* 4.08*  --   --  3.98*  --   --   TROPONINI  --   --   --  <0.03 <0.03 <0.03  --   --     Estimated Creatinine Clearance: 9.2 mL/min (by C-G formula based on SCr of 3.98 mg/dL (H)).  Assessment: 83yo female with AFib and on apixaban PTA, admitted with AKI.  Currently on heparin bridge while holding Apixaban for renal function.  aPTT within goal range.  No bleeding noted.  Goal of Therapy:  aPTT 66-102 seconds Monitor platelets by anticoagulation protocol: Yes   Plan:  Continue heparin 700 units/hr Daily heparn level, aPTT, CBC  Sherlon Handing, PharmD, BCPS Clinical pharmacist, pager [phone] 07/13/2016 11:43 PM

## 2016-07-13 NOTE — Progress Notes (Signed)
Initial Nutrition Assessment  DOCUMENTATION CODES:   Not applicable  INTERVENTION:  Provide Nepro Shake po BID, each supplement provides 425 kcal and 19 grams protein.  Provide nourishment snacks (ordered)  Encourage adequate PO intake.   NUTRITION DIAGNOSIS:   Increased nutrient needs related to acute illness as evidenced by estimated needs.  GOAL:   Patient will meet greater than or equal to 90% of their needs  MONITOR:   PO intake, Supplement acceptance, Labs, Weight trends, Skin, I & O's  REASON FOR ASSESSMENT:   Malnutrition Screening Tool    ASSESSMENT:   83 y.o. female with medical history significant of PAF on Eliquis, solitary kidney, HTN, HLD, CVA; who presented with 2 day history of back pain. Submitted for further management of acute on chronic renal failure  Pt reports nausea has been on and off. Meal completion has been 25%. Pt reports consuming small frequent meals throughout the day PTA. Pt with no significant weight loss. Pt currently has Ensure ordered and has been refusing them. Family at bedside worried about the protein supplements exacerbating pt's creatinine level. Pt agreeable to try Nepro shake instead which is more renal friendly. Pt additionally request nourishment snacks. RD to order. Pt encouraged to eat her foods at meals.   Nutrition-Focused physical exam completed. Findings are no fat depletion, moderate muscle depletion, and moderate edema.   Labs and medications reviewed.   Diet Order:  Diet Heart Room service appropriate? Yes; Fluid consistency: Thin  Skin:  Reviewed, no issues  Last BM:  2/22  Height:   Ht Readings from Last 1 Encounters:  07/11/16 5' (1.524 m)    Weight:   Wt Readings from Last 1 Encounters:  07/12/16 143 lb 11.8 oz (65.2 kg)    Ideal Body Weight:  45.45 kg  BMI:  Body mass index is 28.07 kg/m.  Estimated Nutritional Needs:   Kcal:  1650-1850  Protein:  75-85 grams  Fluid:  1.6 - 1.8  L/day  EDUCATION NEEDS:   No education needs identified at this time  Corrin Parker, MS, RD, LDN Pager # [phone] After hours/ weekend pager # [phone]

## 2016-07-13 NOTE — Progress Notes (Addendum)
ANTICOAGULATION CONSULT NOTE - Follow Up Consult  Pharmacy Consult for heparin Indication: atrial fibrillation  Allergies  Allergen Reactions  . Clonidine Derivatives Palpitations and Other (See Comments)    Very sedated  . Codeine Nausea Only  . Nickel Rash  . Sulfa Antibiotics Other (See Comments)    Reaction unknown    Patient Measurements: Height: 5' (152.4 cm) Weight: 143 lb 11.8 oz (65.2 kg) IBW/kg (Calculated) : 45.5  Vital Signs: Temp: 98.3 F (36.8 C) (02/26 0828) Temp Source: Oral (02/26 0828) BP: 140/49 (02/26 0828) Pulse Rate: 63 (02/26 0828)  Labs:  Recent Labs  07/11/16 0853 07/11/16 1640 07/12/16 0552 07/12/16 1900 07/13/16 0144 07/13/16 0747  HGB 12.6  --  11.6*  --   --  11.8*  HCT 38.5  --  36.3  --   --  36.9  PLT 279  --  272  --   --  290  APTT  --   --   --   --   --  84*  LABPROT 16.8*  --   --   --   --   --   INR 1.35  --   --   --   --   --   HEPARINUNFRC  --   --   --   --  >2.20*  --   CREATININE 4.48* 4.24* 4.08*  --   --  3.98*  TROPONINI  --   --   --  <0.03 <0.03 <0.03    Estimated Creatinine Clearance: 9.3 mL/min (by C-G formula based on SCr of 3.98 mg/dL (H)).   Medications:  Scheduled:  . amLODipine  5 mg Oral BID WC  . feeding supplement (ENSURE ENLIVE)  237 mL Oral BID BM  . furosemide  40 mg Intravenous Daily  . hydrALAZINE  75 mg Oral TID  . magnesium gluconate  500 mg Oral Once per day on Sun Tue Thu Sat  . pravastatin  40 mg Oral QPM    Assessment: 83yo female with AFib and on Apixaban, admitted with AKI.  Currently on heparin bridge while holding Apixaban for renal function.  Hg and pltc are stable, aPTT within goal range.  No bleeding noted.  Goal of Therapy:  aPTT 66-102 seconds Monitor platelets by anticoagulation protocol: Yes   Plan:  Continue heparin 800 units/hr Repeat aPTT in 6hr Daily heparn level, aPTT, CBC Watch for s/s of bleeding   Gracy Bruins, PharmD [facility]  07/13/16  Pharmacy- heparin 1545  APTT 120 sec  A/P:  APTT with large jump on current rate of heparin 800 units/hr.  Per d/w RN, there has been no bleeding and pump is running at correct rate.  1-  Hold heparin x 1hr, then resume at 700 units/hr 2-  Repeat aPTT 6hr after resumed  Gracy Bruins, PharmD Clinical Pharmacist [facility]

## 2016-07-14 ENCOUNTER — Inpatient Hospital Stay (HOSPITAL_COMMUNITY): Payer: Medicare Other

## 2016-07-14 DIAGNOSIS — M549 Dorsalgia, unspecified: Secondary | ICD-10-CM

## 2016-07-14 DIAGNOSIS — I517 Cardiomegaly: Secondary | ICD-10-CM

## 2016-07-14 LAB — BASIC METABOLIC PANEL
ANION GAP: 11 (ref 5–15)
BUN: 65 mg/dL — ABNORMAL HIGH (ref 6–20)
CALCIUM: 8.3 mg/dL — AB (ref 8.9–10.3)
CHLORIDE: 103 mmol/L (ref 101–111)
CO2: 20 mmol/L — AB (ref 22–32)
Creatinine, Ser: 3.81 mg/dL — ABNORMAL HIGH (ref 0.44–1.00)
GFR calc non Af Amer: 10 mL/min — ABNORMAL LOW (ref >60)
GFR, EST AFRICAN AMERICAN: 12 mL/min — AB (ref >60)
Glucose, Bld: 93 mg/dL (ref 65–99)
Potassium: 3.9 mmol/L (ref 3.5–5.1)
SODIUM: 134 mmol/L — AB (ref 135–145)

## 2016-07-14 LAB — ECHOCARDIOGRAM COMPLETE
HEIGHTINCHES: 60 in
Weight: 2229.29 oz

## 2016-07-14 LAB — APTT
APTT: 48 s — AB (ref 24–36)
APTT: 56 s — AB (ref 24–36)
APTT: 66 s — AB (ref 24–36)

## 2016-07-14 LAB — HEPARIN LEVEL (UNFRACTIONATED): Heparin Unfractionated: >2.2 IU/mL — ABNORMAL HIGH (ref 0.30–0.70)

## 2016-07-14 MED ORDER — FUROSEMIDE 10 MG/ML IJ SOLN
80.0000 mg | Freq: Every day | INTRAMUSCULAR | Status: DC
  Administered 2016-07-15: 80 mg via INTRAVENOUS
  Filled 2016-07-14 (×2): qty 8

## 2016-07-14 NOTE — Consult Note (Signed)
Referring Provider: No ref. provider found Primary Care Physician:  Tammi Sou, MD Primary Nephrologist:      Reason for Consultation:   HTN , chronic renal disease stage 3  Acute renal failure  HPI: 83 year old  Female with a history of PAF and a  solitary kidney, hypertension, hyperlipidemia, CVA presented back pain that started today prior to admission.episodes of diarrhea and generalized weakness a week prior to admission. In ED evaluation revealed elevated BUN and creatinine. Baseline 1.4 . Has bilateral lower extremity swelling.  Creatinine was increased on admission. Received IV fluids 5 L and has good urine output although swelling is worse   Patient taking ARB -- Losartan   No NSAIDS  Chronic use of vicodin  Renal ultrasound showed atrophic left kidney and 11 cm right kidney  Bland urine sediment  Some wbc  But no rbc and no proteinuria   No hemodynamic instability       Past Medical History:  Diagnosis Date  . Atrophy of left kidney    with absent blood flow by renal artery dopplers (Dr. Gwenlyn Found)  . 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 Chronic renal insufficiency, stage 2 (mild)    GFR @ 60.  01/2016 renal u/s showed atrophic/hypoplastic left kidney.  No hydronephrosis.  Small right renal cyst.  . History of adenomatous polyp of colon   . History of subarachnoid hemorrhage 10/2014   after syncope and while on xarelto  . Hyperlipidemia   . Hypertension    Difficult to control, in the setting of one functioning kidney: pt was referred to nephrology by Dr. Gwenlyn Found 06/2015.  . Lumbar radiculopathy 2012  . Metatarsal fracture 06/10/2016   Nondisplaced, left 5th metatarsal--pt was referred to ortho  . Osteopenia 2014   T-score -2.1  . PAF (paroxysmal atrial fibrillation) (HCC)    Eliquis started after BRAO and CVA  . Stroke [facility])    cardioembolic (had CVA while on no anticoag)--"scattered subacute punctate infarcts: 1 in R parietal lobe and 2 in  occipital cortex" on MRI br.  CT angio head/neck: aortic arch athero    Past Surgical History:  Procedure Laterality Date  . APPENDECTOMY  child  . CARDIOVASCULAR STRESS TEST  01/31/2016   Stress myoview: NORMAL/Low risk.  EF 56%.  . [place]  . COLONOSCOPY  2015   + hx of adenomatous polyps.  Need digest health spec in [place] records to see when pt due for next colonoscopy  . Renal artery dopplers  02/26/2016   Her right renal dimension was 11 cm pole to pole with mild to moderate right renal artery stenosis. A right renal aortic ratio was 3.22 suggesting less than a 50% stenosis.  . TONSILLECTOMY    . TRANSTHORACIC ECHOCARDIOGRAM  01/29/2016   EF 55-60%, normal LV wall motion, grade I DD.  No cardiac source of emboli was seen.    Prior to Admission medications   Medication Sig Start Date End Date Taking? Authorizing Provider  acetaminophen (TYLENOL) 500 MG tablet Take 500 mg by mouth every 6 (six) hours as needed for moderate pain.   Yes Historical Provider, MD  amLODipine (NORVASC) 5 MG tablet Take 1 tablet (5 mg total) by mouth 2 (two) times daily with breakfast and lunch. 05/25/16  Yes Lorretta Harp, MD  apixaban (ELIQUIS) 5 MG TABS tablet Take 1 tablet (5 mg total) by mouth 2 (two) times daily. 02/12/16  Yes Erlene Quan, PA-C  atenolol-chlorthalidone (  TENORETIC) 100-25 MG tablet Take 1 tablet by mouth every morning. 01/27/16  Yes Historical Provider, MD  Calcium Carb-Cholecalciferol (CALCIUM/VITAMIN D PO) Take 1 tablet by mouth 4 (four) times a week.   Yes Historical Provider, MD  hydrALAZINE (APRESOLINE) 50 MG tablet Take 1 tablet (50 mg total) by mouth 3 (three) times daily. Patient taking differently: Take 75 mg by mouth 3 (three) times daily.  05/01/16 07/30/16 Yes Lorretta Harp, MD  HYDROcodone-acetaminophen (NORCO) 5-325 MG tablet Take 1 tablet by mouth every 6 (six) hours as needed for moderate pain. 06/10/16  Yes Miguel Joellen Jersey, MD  losartan (COZAAR)  50 MG tablet Take 1 tablet (50 mg total) by mouth daily. 06/24/16 09/22/16 Yes Lorretta Harp, MD  Magnesium Gluconate (MAGNESIUM 27) 500 (27 Mg) MG TABS Take 500 mg by mouth 4 (four) times a week.    Yes Historical Provider, MD  Multiple Vitamin (MULTIVITAMIN) tablet Take 1 tablet by mouth 3 (three) times a week.    Yes Historical Provider, MD  ondansetron (ZOFRAN) 4 MG tablet Take 1 tablet (4 mg total) by mouth every 8 (eight) hours as needed for nausea or vomiting. Patient taking differently: Take 2-4 mg by mouth every 8 (eight) hours as needed for nausea or vomiting.  07/08/16  Yes Renee A Kuneff, DO  pravastatin (PRAVACHOL) 40 MG tablet Take 40 mg by mouth every evening. 07/16/15  Yes Historical Provider, MD  vitamin C (ASCORBIC ACID) 500 MG tablet Take 500 mg by mouth daily.   Yes Historical Provider, MD    Current Facility-Administered Medications  Medication Dose Route Frequency Provider Last Rate Last Dose  . albuterol (PROVENTIL) (2.5 MG/3ML) 0.083% nebulizer solution 2.5 mg  2.5 mg Nebulization Q2H PRN Rondell A Tamala Julian, MD      . amLODipine (NORVASC) tablet 5 mg  5 mg Oral BID WC Rondell Charmayne Sheer, MD   5 mg at 07/13/16 1228  . feeding supplement (NEPRO CARB STEADY) liquid 237 mL  237 mL Oral BID BM Doreatha Lew, MD   237 mL at 07/13/16 1829  . fentaNYL (SUBLIMAZE) injection 25-50 mcg  25-50 mcg Intravenous Q2H PRN Norval Morton, MD   50 mcg at 07/13/16 1744  . furosemide (LASIX) injection 80 mg  80 mg Intravenous BID Doreatha Lew, MD   80 mg at 07/13/16 1750  . heparin ADULT infusion 100 units/mL (25000 units/254mL sodium chloride 0.45%)  700 Units/hr Intravenous Continuous Jaquita Folds, RPH 7 mL/hr at 07/14/16 0630 700 Units/hr at 07/14/16 0630  . hydrALAZINE (APRESOLINE) tablet 75 mg  75 mg Oral TID Norval Morton, MD   75 mg at 07/13/16 2300  . HYDROcodone-acetaminophen (NORCO/VICODIN) 5-325 MG per tablet 1 tablet  1 tablet Oral Q6H PRN Norval Morton, MD   1 tablet at  07/14/16 [phone]  . magnesium gluconate (MAGONATE) tablet 500 mg  500 mg Oral Once per day on Sun Tue Thu Sat Norval Morton, MD   500 mg at 07/12/16 0174  . methocarbamol (ROBAXIN) tablet 750 mg  750 mg Oral TID Doreatha Lew, MD   750 mg at 07/13/16 2300  . ondansetron (ZOFRAN) tablet 4 mg  4 mg Oral Q6H PRN Norval Morton, MD   4 mg at 07/12/16 2134   Or  . ondansetron (ZOFRAN) injection 4 mg  4 mg Intravenous Q6H PRN Norval Morton, MD   4 mg at 07/13/16 1228  . polyethylene glycol (MIRALAX / GLYCOLAX) packet 17  g  17 g Oral Daily PRN Benito Mccreedy, MD   17 g at 07/12/16 2021  . pravastatin (PRAVACHOL) tablet 40 mg  40 mg Oral QPM Rondell Charmayne Sheer, MD   40 mg at 07/13/16 1750  . senna-docusate (Senokot-S) tablet 1 tablet  1 tablet Oral QHS PRN Benito Mccreedy, MD   1 tablet at 07/12/16 2134    Allergies as of 07/11/2016 - Review Complete 07/11/2016  Allergen Reaction Noted  . Clonidine derivatives Palpitations and Other (See Comments) 07/11/2016  . Codeine Nausea Only 06/10/2016  . Nickel Rash 02/03/2011  . Sulfa antibiotics Other (See Comments) 01/28/2016    Family History  Problem Relation Age of Onset  . Heart disease Father   . Early death Father   . Sudden Cardiac Death Neg Hx     Social History   Social History  . Marital status: Widowed    Spouse name: N/A  . Number of children: N/A  . Years of education: N/A   Occupational History  . Not on file.   Social History Main Topics  . Smoking status: Never Smoker  . Smokeless tobacco: Never Used  . Alcohol use 0.6 oz/week    1 Glasses of wine per week     Comment: wine  . Drug use: No  . Sexual activity: No   Other Topics Concern  . Not on file   Social History Narrative   Widow.  One daughter, lives with her.   Educ: college   Occup: retired Marine scientist.   No T/A/Ds.   She is almost a vegetarian.    Review of Systems: Gen: Denies any fever, chills, sweats, anorexia, fatigue, weakness, malaise,  weight loss, and sleep disorder HEENT: No visual complaints, No history of Retinopathy. Normal external appearance No Epistaxis or Sore throat. No sinusitis.   CV: Denies chest pain, angina, palpitations, syncope, orthopnea, PND, peripheral edema, and claudication. Resp: Denies dyspnea at rest, dyspnea with exercise, cough, sputum, wheezing, coughing up blood, and pleurisy. GI: Denies vomiting blood, jaundice, and fecal incontinence.   Denies dysphagia or odynophagia. GU : Denies urinary burning, blood in urine, urinary frequency, urinary hesitancy, nocturnal urination, and urinary incontinence.  No renal calculi. MS: Denies joint pain, limitation of movement, and swelling, stiffness, low back pain, extremity pain. Denies muscle weakness, cramps, atrophy.  No use of non steroidal antiinflammatory drugs. Derm: Denies rash, itching, dry skin, hives, moles, warts, or unhealing ulcers.  Psych: Denies depression, anxiety, memory loss, suicidal ideation, hallucinations, paranoia, and confusion. Heme: Denies bruising, bleeding, and enlarged lymph nodes. Neuro: No headache.  No diplopia. No dysarthria.  No dysphasia.  No history of CVA.  No Seizures. No paresthesias.  No weakness. Endocrine No DM.  No Thyroid disease.  No Adrenal disease.  Physical Exam: Vital signs in last 24 hours: Temp:  [97.7 F (36.5 C)-98.3 F (36.8 C)] 98.3 F (36.8 C) (02/27 0415) Pulse Rate:  [63-71] 68 (02/27 0415) Resp:  [18] 18 (02/27 0415) BP: (133-157)/(43-65) 148/52 (02/27 0415) SpO2:  [94 %-98 %] 94 % (02/27 0415) Weight:  [63.2 kg (139 lb 5.3 oz)] 63.2 kg (139 lb 5.3 oz) (02/26 2100) Last BM Date: 07/13/16 General:   Alert,  Well-developed, well-nourished, pleasant and cooperative in NAD Head:  Normocephalic and atraumatic. Eyes:  Sclera clear, no icterus.   Conjunctiva pink. Ears:  Normal auditory acuity. Nose:  No deformity, discharge,  or lesions. Mouth:  No deformity or lesions, dentition normal. Neck:   Supple; no masses or  thyromegaly. JVP not elevated Lungs:  Clear throughout to auscultation.   No wheezes, crackles, or rhonchi. No acute distress. Heart:  Regular rate and rhythm; no murmurs, clicks, rubs,  or gallops. Abdomen:  Soft, nontender and nondistended. No masses, hepatosplenomegaly or hernias noted. Normal bowel sounds, without guarding, and without rebound.   Msk:  Symmetrical without gross deformities. Normal posture. Pulses:  No carotid, renal, femoral bruits. DP and PT symmetrical and equal Extremities:  Without clubbing or edema. Neurologic:  Alert and  oriented x4;  grossly normal neurologically. Skin:  Intact without significant lesions or rashes. Cervical Nodes:  No significant cervical adenopathy. Psych:  Alert and cooperative. Normal mood and affect.  Intake/Output from previous day: 02/26 0701 - 02/27 0700 In: 674 [P.O.:610; I.V.:64] Out: 1025 [Urine:1025] Intake/Output this shift: No intake/output data recorded.  Lab Results:  Recent Labs  07/11/16 0853 07/12/16 0552 07/13/16 0747  WBC 8.7 8.0 7.3  HGB 12.6 11.6* 11.8*  HCT 38.5 36.3 36.9  PLT 279 272 290   BMET  Recent Labs  07/11/16 1640 07/12/16 0552 07/13/16 0747  NA 135 135 133*  K 4.3 4.5 4.2  CL 104 106 104  CO2 21* 19* 19*  GLUCOSE 114* 96 88  BUN 71* 69* 65*  CREATININE 4.24* 4.08* 3.98*  CALCIUM 7.9* 7.8* 7.9*   LFT  Recent Labs  07/11/16 0853  PROT 5.9*  ALBUMIN 2.7*  AST 25  ALT 17  ALKPHOS 57  BILITOT 0.5   PT/INR  Recent Labs  07/11/16 0853  LABPROT 16.8*  INR 1.35   Hepatitis Panel No results for input(s): HEPBSAG, HCVAB, HEPAIGM, HEPBIGM in the last 72 hours.  Studies/Results: Dg Chest Port 1 View  Result Date: 07/12/2016 CLINICAL DATA:  Fluid retention and legs. EXAM: PORTABLE CHEST 1 VIEW COMPARISON:  07/11/2016 FINDINGS: There is moderate cardiac enlargement. Aortic atherosclerosis noted. Small to moderate left pleural effusion is again identified. No  airspace opacities. IMPRESSION: Similar appearance of cardiac enlargement, aortic atherosclerosis and left pleural effusion. Electronically Signed   By: Kerby Moors M.D.   On: 07/12/2016 18:43    Assessment/Plan:  Acute on chronic renal failure with a baseline creatinine of 1.4  Solitary kidney with right atrophy . I suspect ATN in setting of dehydration andARB  - losartan . Non oliguric but concerned about increase in edema with IV fluids  HTN  Some relatively high BP agree with holding ARB  Anemia stable    LOS: 3 Elzia Hott W @TODAY @7 :59 AM

## 2016-07-14 NOTE — Progress Notes (Addendum)
PROGRESS NOTE [facility]   Elsey Holts   TKW:[id number] DOB: Apr 17, 1933  DOA: 07/11/2016 PCP: Tammi Sou, MD   Brief Narrative:  83-year-old female with medical history significant of PAF on pelvic wheeze, solitary kidney, hypertension, hyperlipidemia, CVA presented back pain that started today prior to admission. She also report that had some episodes of diarrhea and generalized weakness a week prior to admission. In ED evaluation revealed elevated BUN and creatinine, she is admitted for acute on chronic renal failure. 6 x-ray show pleural effusions with increasing vascular markings and cardiomegaly.  Subjective: Patient seen and examined this Am, report minor improvement after lasix, pain is less but still present. C/w SOB and leg swelling. Denies chest pain or palpitations   Assessment & Plan: Acute on CKD3 - Baseline Cr 1.4 - on chart review patient seem to be fluid overload rather than dehydration on initial CXR she had new pleural effusion with increase in vascular markings and cardiomegaly. Patient with 2+ LE pitting edema. ? Cardiorenal syndrome - Cr continues to improve with diuresis  Nephrology recommendations appreciated  Patient was started on lasix on 2/25 Cr continues to improve decrease lasix to 80mg  once a day  Avoid nephrotoxins Renal US no hydronephrosis  ECHO done show diastolic dysfunction and small pericardial effusion without tamponade  PAF: chadsvascscore 5 Rate controlled Continue Eliquis  Back pain - likely MSK although improving since diuretics, patient have a left pleural effusion where the pain is located. Better with robaxin  Continue Robaxin  Pain control PRN  HTN BP stable  Holding losartan and chlorthalidone  Continue Norvasc, hydralazine, patient also on Lasix  Monitor BP closely   DVT prophylaxis: Eliquis  Code Status: FULL  Family Communication: None at bedside  Disposition Plan: Anticipate discharge back to home when Cr  improve.    Consultants:   Nephrology - Dr. Justin Mend   Procedures:   Renal US   ECHO 2/27 Impressions:  - Compared to the prior study in 2017, the LVEF is higher at   60-65%. There is moderate LVH. The left atrium is severely   dilated. A small posterior pericardial effusion is noted without   tamponade features. There is diastolic dysfunction with elevated   left and right heart pressures.  Antimicrobials:  None     Objective: Vitals:   07/13/16 1604 07/13/16 2100 07/14/16 0415 07/14/16 1000  BP: (!) 157/65 (!) 141/56 (!) 148/52 (!) 142/61  Pulse: 69 71 68 70  Resp:  18 18 18   Temp:  97.7 F (36.5 C) 98.3 F (36.8 C) 98.3 F (36.8 C)  TempSrc:  Oral Oral Oral  SpO2:  94% 94% 96%  Weight:  63.2 kg (139 lb 5.3 oz)    Height:        Intake/Output Summary (Last 24 hours) at 07/14/16 1425 Last data filed at 07/14/16 0900  Gross per 24 hour  Intake              674 ml  Output              925 ml  Net             -251 ml   Filed Weights   07/11/16 2127 07/12/16 2148 07/13/16 2100  Weight: 67.3 kg (148 lb 5.9 oz) 65.2 kg (143 lb 11.8 oz) 63.2 kg (139 lb 5.3 oz)    Examination:  General exam: NAD on O2 [place]  Respiratory system: Breath sound diminished mild crackles at the left base - improved  Cardiovascular system: S1S2 RRR +SM  Gastrointestinal system: Abdomen soft NDNT Central nervous system: Alert and oriented. Extremities: 1+ pitting pedal edema.  Skin: No rashes Psychiatry: Judgement and insight appear normal. Mood & affect appropriate.    Data Reviewed: I have personally reviewed following labs and imaging studies  CBC:  Recent Labs Lab 07/11/16 0853 07/12/16 0552 07/13/16 0747  WBC 8.7 8.0 7.3  NEUTROABS 6.6  --  5.4  HGB 12.6 11.6* 11.8*  HCT 38.5 36.3 36.9  MCV 92.3 93.3 92.9  PLT 279 272 409   Basic Metabolic Panel:  Recent Labs Lab 07/11/16 0853 07/11/16 1640 07/12/16 0552 07/13/16 0747 07/13/16 1334 07/14/16 0724  NA 135 135  135 133*  --  134*  K 4.4 4.3 4.5 4.2  --  3.9  CL 100* 104 106 104  --  103  CO2 22 21* 19* 19*  --  20*  GLUCOSE 106* 114* 96 88  --  93  BUN 74* 71* 69* 65*  --  65*  CREATININE 4.48* 4.24* 4.08* 3.98*  --  3.81*  CALCIUM 8.2* 7.9* 7.8* 7.9*  --  8.3*  MG  --   --   --   --  1.7  --    GFR: Estimated Creatinine Clearance: 9.6 mL/min (by C-G formula based on SCr of 3.81 mg/dL (H)). Liver Function Tests:  Recent Labs Lab 07/11/16 0853  AST 25  ALT 17  ALKPHOS 57  BILITOT 0.5  PROT 5.9*  ALBUMIN 2.7*    Recent Labs Lab 07/11/16 0853  LIPASE 39   No results for input(s): AMMONIA in the last 168 hours. Coagulation Profile:  Recent Labs Lab 07/11/16 0853  INR 1.35   Cardiac Enzymes:  Recent Labs Lab 07/12/16 1900 07/13/16 0144 07/13/16 0747  TROPONINI <0.03 <0.03 <0.03   BNP (last 3 results) No results for input(s): PROBNP in the last 8760 hours. HbA1C: No results for input(s): HGBA1C in the last 72 hours. CBG: No results for input(s): GLUCAP in the last 168 hours. Lipid Profile: No results for input(s): CHOL, HDL, LDLCALC, TRIG, CHOLHDL, LDLDIRECT in the last 72 hours. Thyroid Function Tests: No results for input(s): TSH, T4TOTAL, FREET4, T3FREE, THYROIDAB in the last 72 hours. Anemia Panel: No results for input(s): VITAMINB12, FOLATE, FERRITIN, TIBC, IRON, RETICCTPCT in the last 72 hours. Sepsis Labs:  Recent Labs Lab 07/11/16 8119  LATICACIDVEN 0.67    Recent Results (from the past 240 hour(s))  Urine culture     Status: None   Collection Time: 07/11/16 11:05 AM  Result Value Ref Range Status   Specimen Description URINE, RANDOM  Final   Special Requests NONE  Final   Culture NO GROWTH  Final   Report Status 07/12/2016 FINAL  Final      Radiology Studies: Dg Chest Port 1 View  Result Date: 07/12/2016 CLINICAL DATA:  Fluid retention and legs. EXAM: PORTABLE CHEST 1 VIEW COMPARISON:  07/11/2016 FINDINGS: There is moderate cardiac  enlargement. Aortic atherosclerosis noted. Small to moderate left pleural effusion is again identified. No airspace opacities. IMPRESSION: Similar appearance of cardiac enlargement, aortic atherosclerosis and left pleural effusion. Electronically Signed   By: Kerby Moors M.D.   On: 07/12/2016 18:43    Scheduled Meds: . amLODipine  5 mg Oral BID WC  . feeding supplement (NEPRO CARB STEADY)  237 mL Oral BID BM  . furosemide  80 mg Intravenous BID  . hydrALAZINE  75 mg Oral TID  . magnesium gluconate  500 mg Oral Once per day on Sun Tue Thu Sat  . methocarbamol  750 mg Oral TID  . pravastatin  40 mg Oral QPM   Continuous Infusions: . heparin 750 Units/hr (07/14/16 1153)     LOS: 3 days    Chipper Oman, MD Pager: Text Page via [email or web address]  [phone]  If 7PM-7AM, please contact night-coverage [email or web address] Password [email or web address] 07/14/2016, 2:25 PM

## 2016-07-14 NOTE — Progress Notes (Signed)
  Echocardiogram 2D Echocardiogram has been performed.  Jennette Dubin 07/14/2016, 10:41 AM

## 2016-07-14 NOTE — Progress Notes (Signed)
ANTICOAGULATION CONSULT NOTE - Follow Up Consult  Pharmacy Consult for heparin Indication: atrial fibrillation  Allergies  Allergen Reactions  . Clonidine Derivatives Palpitations and Other (See Comments)    Very sedated  . Codeine Nausea Only  . Nickel Rash  . Sulfa Antibiotics Other (See Comments)    Reaction unknown    Patient Measurements: Height: 5' (152.4 cm) Weight: 139 lb 5.3 oz (63.2 kg) IBW/kg (Calculated) : 45.5  Vital Signs: Temp: 98.2 F (36.8 C) (02/27 1834) Temp Source: Oral (02/27 1834) BP: 138/55 (02/27 1834) Pulse Rate: 68 (02/27 1834)  Labs:  Recent Labs  07/12/16 0552 07/12/16 1900 07/13/16 0144 07/13/16 0747  07/14/16 0724 07/14/16 0728 07/14/16 1230 07/14/16 2023  HGB 11.6*  --   --  11.8*  --   --   --   --   --   HCT 36.3  --   --  36.9  --   --   --   --   --   PLT 272  --   --  290  --   --   --   --   --   APTT  --   --   --  84*  < >  --  48* 56* 66*  HEPARINUNFRC  --   --  >2.20*  --   --  >2.20*  --   --   --   CREATININE 4.08*  --   --  3.98*  --  3.81*  --   --   --   TROPONINI  --  <0.03 <0.03 <0.03  --   --   --   --   --   < > = values in this interval not displayed.  Estimated Creatinine Clearance: 9.6 mL/min (by C-G formula based on SCr of 3.81 mg/dL (H)).   Medications:  Scheduled:  . amLODipine  5 mg Oral BID WC  . feeding supplement (NEPRO CARB STEADY)  237 mL Oral BID BM  . [START ON 07/15/2016] furosemide  80 mg Intravenous Daily  . hydrALAZINE  75 mg Oral TID  . magnesium gluconate  500 mg Oral Once per day on Sun Tue Thu Sat  . pravastatin  40 mg Oral QPM    Assessment: 83yo female with AFib and on Apixaban, admitted with AKI.  Currently on heparin bridge while holding Apixaban for renal function.  HL reflecting continued effect of Apixaban, slow to clear with AKI;  aPTT this evening resulted as therapeutic (aPTT 66, goal of 66-102). No bleeding noted - will continue at the current rate and f/u AM aPTT.    Goal of Therapy:  Heparin level 0.3-0.7 units/ml aPTT 66-102 seconds Monitor platelets by anticoagulation protocol: Yes   Plan:  1. Continue Heparin at 750 units/hr (7.5 ml/hr) 2. Will continue to monitor for any signs/symptoms of bleeding and will follow up with heparin level in the a.m.   Thank you for allowing pharmacy to be a part of this patient's care.  Alycia Rossetti, PharmD, BCPS Clinical Pharmacist Pager: [phone] 07/14/2016 9:47 PM

## 2016-07-14 NOTE — Progress Notes (Signed)
ANTICOAGULATION CONSULT NOTE - Follow Up Consult  Pharmacy Consult for heparin Indication: atrial fibrillation  Allergies  Allergen Reactions  . Clonidine Derivatives Palpitations and Other (See Comments)    Very sedated  . Codeine Nausea Only  . Nickel Rash  . Sulfa Antibiotics Other (See Comments)    Reaction unknown    Patient Measurements: Height: 5' (152.4 cm) Weight: 139 lb 5.3 oz (63.2 kg) IBW/kg (Calculated) : 45.5  Vital Signs: Temp: 98.3 F (36.8 C) (02/27 1000) Temp Source: Oral (02/27 1000) BP: 142/61 (02/27 1000) Pulse Rate: 70 (02/27 1000)  Labs:  Recent Labs  07/12/16 0552 07/12/16 1900 07/13/16 0144  07/13/16 0747 07/13/16 1334 07/13/16 2252 07/14/16 0724 07/14/16 0728  HGB 11.6*  --   --   --  11.8*  --   --   --   --   HCT 36.3  --   --   --  36.9  --   --   --   --   PLT 272  --   --   --  290  --   --   --   --   APTT  --   --   --   < > 84* 120* 70*  --  48*  HEPARINUNFRC  --   --  >2.20*  --   --   --   --  >2.20*  --   CREATININE 4.08*  --   --   --  3.98*  --   --  3.81*  --   TROPONINI  --  <0.03 <0.03  --  <0.03  --   --   --   --   < > = values in this interval not displayed.  Estimated Creatinine Clearance: 9.6 mL/min (by C-G formula based on SCr of 3.81 mg/dL (H)).   Medications:  Scheduled:  . amLODipine  5 mg Oral BID WC  . feeding supplement (NEPRO CARB STEADY)  237 mL Oral BID BM  . furosemide  80 mg Intravenous BID  . hydrALAZINE  75 mg Oral TID  . magnesium gluconate  500 mg Oral Once per day on Sun Tue Thu Sat  . methocarbamol  750 mg Oral TID  . pravastatin  40 mg Oral QPM    Assessment: 83yo female with AFib and on Apixaban, admitted with AKI.  Currently on heparin bridge while holding Apixaban for renal function.  HL reflecting continued effect of Apixaban, slow to clear with AKI; aPTT dropped to 48.  No CBC done.  Heparin was not off during ECHO.  PTT was high on 800 units/hr, and is low on current rate of 700  units/hr  Goal of Therapy:  Heparin level 0.3-0.7 units/ml aPTT 66-102 seconds Monitor platelets by anticoagulation protocol: Yes   Plan:  Increase heparin to 750 units/hr Repeat aPTT 6hr Daily Heparin level, CBC, aPTT Watch for s/s of bleeding   Gracy Bruins, PharmD [facility]

## 2016-07-15 ENCOUNTER — Inpatient Hospital Stay (HOSPITAL_COMMUNITY): Payer: Medicare Other

## 2016-07-15 DIAGNOSIS — N183 Chronic kidney disease, stage 3 (moderate): Secondary | ICD-10-CM

## 2016-07-15 DIAGNOSIS — R109 Unspecified abdominal pain: Secondary | ICD-10-CM

## 2016-07-15 DIAGNOSIS — N179 Acute kidney failure, unspecified: Secondary | ICD-10-CM

## 2016-07-15 DIAGNOSIS — I480 Paroxysmal atrial fibrillation: Secondary | ICD-10-CM

## 2016-07-15 DIAGNOSIS — I10 Essential (primary) hypertension: Secondary | ICD-10-CM

## 2016-07-15 LAB — BASIC METABOLIC PANEL
Anion gap: 11 (ref 5–15)
BUN: 63 mg/dL — ABNORMAL HIGH (ref 6–20)
CO2: 21 mmol/L — AB (ref 22–32)
Calcium: 8.6 mg/dL — ABNORMAL LOW (ref 8.9–10.3)
Chloride: 102 mmol/L (ref 101–111)
Creatinine, Ser: 3.26 mg/dL — ABNORMAL HIGH (ref 0.44–1.00)
GFR calc Af Amer: 14 mL/min — ABNORMAL LOW (ref >60)
GFR calc non Af Amer: 12 mL/min — ABNORMAL LOW (ref >60)
Glucose, Bld: 104 mg/dL — ABNORMAL HIGH (ref 65–99)
POTASSIUM: 3.7 mmol/L (ref 3.5–5.1)
Sodium: 134 mmol/L — ABNORMAL LOW (ref 135–145)

## 2016-07-15 LAB — CBC
HEMATOCRIT: 36.7 % (ref 36.0–46.0)
HEMOGLOBIN: 11.9 g/dL — AB (ref 12.0–15.0)
MCH: 29.7 pg (ref 26.0–34.0)
MCHC: 32.4 g/dL (ref 30.0–36.0)
MCV: 91.5 fL (ref 78.0–100.0)
Platelets: 298 10*3/uL (ref 150–400)
RBC: 4.01 MIL/uL (ref 3.87–5.11)
RDW: 14.8 % (ref 11.5–15.5)
WBC: 7.4 10*3/uL (ref 4.0–10.5)

## 2016-07-15 LAB — PHOSPHORUS: Phosphorus: 5 mg/dL — ABNORMAL HIGH (ref 2.5–4.6)

## 2016-07-15 LAB — APTT
APTT: 59 s — AB (ref 24–36)
APTT: 80 s — AB (ref 24–36)
APTT: 77 s — AB (ref 24–36)

## 2016-07-15 LAB — MAGNESIUM: Magnesium: 1.5 mg/dL — ABNORMAL LOW (ref 1.7–2.4)

## 2016-07-15 LAB — HEPARIN LEVEL (UNFRACTIONATED): Heparin Unfractionated: 1.8 IU/mL — ABNORMAL HIGH (ref 0.30–0.70)

## 2016-07-15 MED ORDER — METHOCARBAMOL 1000 MG/10ML IJ SOLN
500.0000 mg | Freq: Three times a day (TID) | INTRAVENOUS | Status: DC | PRN
  Filled 2016-07-15: qty 5

## 2016-07-15 MED ORDER — HYDROCODONE-ACETAMINOPHEN 5-325 MG PO TABS
1.0000 | ORAL_TABLET | ORAL | Status: DC | PRN
  Administered 2016-07-15 (×2): 2 via ORAL
  Administered 2016-07-15: 1 via ORAL
  Administered 2016-07-16: 2 via ORAL
  Administered 2016-07-16: 1 via ORAL
  Administered 2016-07-17 – 2016-07-18 (×3): 2 via ORAL
  Administered 2016-07-18: 1 via ORAL
  Administered 2016-07-18 (×2): 2 via ORAL
  Administered 2016-07-19: 1 via ORAL
  Administered 2016-07-19 – 2016-07-20 (×3): 2 via ORAL
  Administered 2016-07-20: 1 via ORAL
  Filled 2016-07-15 (×9): qty 2
  Filled 2016-07-15 (×2): qty 1
  Filled 2016-07-15: qty 2
  Filled 2016-07-15: qty 1
  Filled 2016-07-15 (×4): qty 2
  Filled 2016-07-15: qty 1
  Filled 2016-07-15: qty 2

## 2016-07-15 MED ORDER — METHOCARBAMOL 500 MG PO TABS
750.0000 mg | ORAL_TABLET | Freq: Three times a day (TID) | ORAL | Status: DC | PRN
  Administered 2016-07-15 – 2016-07-20 (×8): 750 mg via ORAL
  Filled 2016-07-15 (×8): qty 2

## 2016-07-15 NOTE — Progress Notes (Signed)
[facility] ROUNDING NOTE   Subjective:   Interval History: 83 year old  Female with a history of PAF and a  solitary kidney, hypertension, hyperlipidemia, CVA presented back pain that started today prior to admission.episodes of diarrhea and generalized weakness a week prior to admission. In ED evaluation revealed elevated BUN and creatinine. Baseline 1.4 . Has bilateral lower extremity swelling.  Creatinine was increased on admission. Received IV fluids 5 L and has good urine output although swelling is worse - I suspect ATN  Insetting of dehydration and ARB    Objective:  Vital signs in last 24 hours:  Temp:  [98 F (36.7 C)-98.6 F (37 C)] 98.6 F (37 C) (02/28 0907) Pulse Rate:  [67-75] 75 (02/28 0907) Resp:  [18] 18 (02/28 0907) BP: (138-167)/(50-68) 167/68 (02/28 0907) SpO2:  [91 %-96 %] 96 % (02/28 0907) Weight:  [68.8 kg (151 lb 11.2 oz)] 68.8 kg (151 lb 11.2 oz) (02/27 2209)  Weight change: 5.611 kg (12 lb 5.9 oz) Filed Weights   07/12/16 2148 07/13/16 2100 07/14/16 2209  Weight: 65.2 kg (143 lb 11.8 oz) 63.2 kg (139 lb 5.3 oz) 68.8 kg (151 lb 11.2 oz)    Intake/Output: I/O last 3 completed shifts: In: 1352.6 [P.O.:1090; I.V.:262.6] Out: 1250 [Urine:1250]   Intake/Output this shift:  Total I/O In: 240 [P.O.:240] Out: 125 [Urine:125]  CVS- RRR RS- CTA ABD- BS present soft non-distended EXT- no edema   Basic Metabolic Panel:  Recent Labs Lab 07/11/16 1640 07/12/16 0552 07/13/16 0747 07/13/16 1334 07/14/16 0724 07/15/16 0453  NA 135 135 133*  --  134* 134*  K 4.3 4.5 4.2  --  3.9 3.7  CL 104 106 104  --  103 102  CO2 21* 19* 19*  --  20* 21*  GLUCOSE 114* 96 88  --  93 104*  BUN 71* 69* 65*  --  65* 63*  CREATININE 4.24* 4.08* 3.98*  --  3.81* 3.26*  CALCIUM 7.9* 7.8* 7.9*  --  8.3* 8.6*  MG  --   --   --  1.7  --  1.5*  PHOS  --   --   --   --   --  5.0*    Liver Function Tests:  Recent Labs Lab 07/11/16 0853  AST 25  ALT 17   ALKPHOS 57  BILITOT 0.5  PROT 5.9*  ALBUMIN 2.7*    Recent Labs Lab 07/11/16 0853  LIPASE 39   No results for input(s): AMMONIA in the last 168 hours.  CBC:  Recent Labs Lab 07/11/16 0853 07/12/16 0552 07/13/16 0747 07/15/16 0453  WBC 8.7 8.0 7.3 7.4  NEUTROABS 6.6  --  5.4  --   HGB 12.6 11.6* 11.8* 11.9*  HCT 38.5 36.3 36.9 36.7  MCV 92.3 93.3 92.9 91.5  PLT 279 272 290 298    Cardiac Enzymes:  Recent Labs Lab 07/12/16 1900 07/13/16 0144 07/13/16 0747  TROPONINI <0.03 <0.03 <0.03    BNP: Invalid input(s): POCBNP  CBG: No results for input(s): GLUCAP in the last 168 hours.  Microbiology: Results for orders placed or performed during the hospital encounter of 07/11/16  Urine culture     Status: None   Collection Time: 07/11/16 11:05 AM  Result Value Ref Range Status   Specimen Description URINE, RANDOM  Final   Special Requests NONE  Final   Culture NO GROWTH  Final   Report Status 07/12/2016 FINAL  Final    Coagulation Studies:  No results for input(s): LABPROT, INR in the last 72 hours.  Urinalysis: No results for input(s): COLORURINE, LABSPEC, PHURINE, GLUCOSEU, HGBUR, BILIRUBINUR, KETONESUR, PROTEINUR, UROBILINOGEN, NITRITE, LEUKOCYTESUR in the last 72 hours.  Invalid input(s): APPERANCEUR    Imaging: No results found.   Medications:   . heparin 850 Units/hr (07/15/16 0640)   . amLODipine  5 mg Oral BID WC  . feeding supplement (NEPRO CARB STEADY)  237 mL Oral BID BM  . furosemide  80 mg Intravenous Daily  . hydrALAZINE  75 mg Oral TID  . magnesium gluconate  500 mg Oral Once per day on Sun Tue Thu Sat  . pravastatin  40 mg Oral QPM   albuterol, HYDROcodone-acetaminophen, methocarbamol (ROBAXIN)  IV, ondansetron **OR** ondansetron (ZOFRAN) IV, polyethylene glycol, senna-docusate  Assessment/ Plan:   Acute renal failure in setting of dehydration and ARB  -- This appears to be due to ATN.  Her back pain continues to be a  significant problem. It may be worth additional imaging. SPEP / UPEP could also be considered  Will hold diuretics for now  - she has good urine output and not really appearing volume overloaded     LOS: 4 Chantel Teti W @TODAY @11 :03 AM

## 2016-07-15 NOTE — Progress Notes (Signed)
ANTICOAGULATION CONSULT NOTE - Follow Up Consult  Pharmacy Consult for heparin Indication: atrial fibrillation  Labs:  Recent Labs  07/12/16 1900 07/13/16 0144 07/13/16 0747  07/14/16 0724  07/14/16 1230 07/14/16 2023 07/15/16 0453  HGB  --   --  11.8*  --   --   --   --   --  11.9*  HCT  --   --  36.9  --   --   --   --   --  36.7  PLT  --   --  290  --   --   --   --   --  298  APTT  --   --  84*  < >  --   < > 56* 66* 59*  HEPARINUNFRC  --  >2.20*  --   --  >2.20*  --   --   --  1.80*  CREATININE  --   --  3.98*  --  3.81*  --   --   --  3.26*  TROPONINI <0.03 <0.03 <0.03  --   --   --   --   --   --   < > = values in this interval not displayed.   Assessment: 83yo female now below goal on heparin (using PTT while Eliquis clears) after one PTT at goal; no gtt issues per RN.  Goal of Therapy:  aPTT 66-102 seconds   Plan:  Will increase heparin gtt by 1-2 units/kg/hr to 850 units/hr and check PTT in 8hr.  Wynona Neat, PharmD, BCPS  07/15/2016,6:40 AM

## 2016-07-15 NOTE — Progress Notes (Signed)
PROGRESS NOTE [facility]   Tenee Wish   QQI:[id number] DOB: 07/24/1932  DOA: 07/11/2016 PCP: Tammi Sou, MD   Brief Narrative:  83-year-old female with medical history significant of PAF, solitary kidney, hypertension, hyperlipidemia, CVA presented back pain that started today prior to admission. She also report that had some episodes of diarrhea and generalized weakness a week prior to admission. In ED evaluation revealed elevated BUN and creatinine, she is admitted for acute on chronic renal failure. cxr-ray show pleural effusions with increasing vascular markings and cardiomegaly.  Subjective: Pt reports back pain and some discomfort on changing position. No chest pain or sob.   Assessment & Plan: Acute on CKD3 - Baseline Cr 1.4 - on chart review patient seem to be fluid overload rather than dehydration on initial CXR she had new pleural effusion with increase in vascular markings and cardiomegaly. Patient with 2+ LE pitting edema. ? Cardiorenal syndrome - Cr continues to improve with diuresis . Creatinine is 3.26 today.  Nephrology recommendations appreciated. She was started on IV lasix, stopped by nephrology.  Avoid nephrotoxins Renal US no hydronephrosis  ECHO done show diastolic dysfunction and small pericardial effusion without tamponade  PAF: chadsvascscore 5 Rate controlled On IV heparin while in the hospital.   Back pain - likely musculoskeletal. Improving with robaxin. LS x ray show Lumbar disc degeneration greatest at L5-S1. No evidence acute osseous abnormality. Increase pain meds.    HTN BP stable.    DVT prophylaxis: heparin.   Code Status: FULL  Family Communication: None at bedside  Disposition Plan: Anticipate discharge back to home when Cr improve.    Consultants:   Nephrology - Dr. Justin Mend   Procedures:   Renal US   ECHO 2/27 Impressions:  - Compared to the prior study in 2017, the LVEF is higher at   60-65%. There is moderate  LVH. The left atrium is severely   dilated. A small posterior pericardial effusion is noted without   tamponade features. There is diastolic dysfunction with elevated   left and right heart pressures.  Antimicrobials:  None     Objective: Vitals:   07/14/16 2209 07/15/16 0524 07/15/16 0907 07/15/16 1724  BP: (!) 158/50 (!) 163/63 (!) 167/68 (!) 156/63  Pulse: 67 70 75 78  Resp: 18 18 18 18   Temp: 98.4 F (36.9 C) 98 F (36.7 C) 98.6 F (37 C) 98 F (36.7 C)  TempSrc: Oral Oral Oral Oral  SpO2: 94% 91% 96% 98%  Weight: 68.8 kg (151 lb 11.2 oz)     Height:        Intake/Output Summary (Last 24 hours) at 07/15/16 1745 Last data filed at 07/15/16 1700  Gross per 24 hour  Intake           862.58 ml  Output             1285 ml  Net          -422.42 ml   Filed Weights   07/12/16 2148 07/13/16 2100 07/14/16 2209  Weight: 65.2 kg (143 lb 11.8 oz) 63.2 kg (139 lb 5.3 oz) 68.8 kg (151 lb 11.2 oz)    Examination:  General exam: NAD on O2 [place]  Respiratory system: Breath sound diminished mild crackles at the left base - improved  Cardiovascular system: S1S2 RRR +SM  Gastrointestinal system: Abdomen soft NDNT Central nervous system: Alert and oriented. Extremities: 1+ pitting pedal edema.  Skin: No rashes    Data Reviewed: I have personally reviewed  following labs and imaging studies  CBC:  Recent Labs Lab 07/11/16 0853 07/12/16 0552 07/13/16 0747 07/15/16 0453  WBC 8.7 8.0 7.3 7.4  NEUTROABS 6.6  --  5.4  --   HGB 12.6 11.6* 11.8* 11.9*  HCT 38.5 36.3 36.9 36.7  MCV 92.3 93.3 92.9 91.5  PLT 279 272 290 329   Basic Metabolic Panel:  Recent Labs Lab 07/11/16 1640 07/12/16 0552 07/13/16 0747 07/13/16 1334 07/14/16 0724 07/15/16 0453  NA 135 135 133*  --  134* 134*  K 4.3 4.5 4.2  --  3.9 3.7  CL 104 106 104  --  103 102  CO2 21* 19* 19*  --  20* 21*  GLUCOSE 114* 96 88  --  93 104*  BUN 71* 69* 65*  --  65* 63*  CREATININE 4.24* 4.08* 3.98*  --  3.81*  3.26*  CALCIUM 7.9* 7.8* 7.9*  --  8.3* 8.6*  MG  --   --   --  1.7  --  1.5*  PHOS  --   --   --   --   --  5.0*   GFR: Estimated Creatinine Clearance: 11.7 mL/min (by C-G formula based on SCr of 3.26 mg/dL (H)). Liver Function Tests:  Recent Labs Lab 07/11/16 0853  AST 25  ALT 17  ALKPHOS 57  BILITOT 0.5  PROT 5.9*  ALBUMIN 2.7*    Recent Labs Lab 07/11/16 0853  LIPASE 39   No results for input(s): AMMONIA in the last 168 hours. Coagulation Profile:  Recent Labs Lab 07/11/16 0853  INR 1.35   Cardiac Enzymes:  Recent Labs Lab 07/12/16 1900 07/13/16 0144 07/13/16 0747  TROPONINI <0.03 <0.03 <0.03   BNP (last 3 results) No results for input(s): PROBNP in the last 8760 hours. HbA1C: No results for input(s): HGBA1C in the last 72 hours. CBG: No results for input(s): GLUCAP in the last 168 hours. Lipid Profile: No results for input(s): CHOL, HDL, LDLCALC, TRIG, CHOLHDL, LDLDIRECT in the last 72 hours. Thyroid Function Tests: No results for input(s): TSH, T4TOTAL, FREET4, T3FREE, THYROIDAB in the last 72 hours. Anemia Panel: No results for input(s): VITAMINB12, FOLATE, FERRITIN, TIBC, IRON, RETICCTPCT in the last 72 hours. Sepsis Labs:  Recent Labs Lab 07/11/16 9242  LATICACIDVEN 0.67    Recent Results (from the past 240 hour(s))  Urine culture     Status: None   Collection Time: 07/11/16 11:05 AM  Result Value Ref Range Status   Specimen Description URINE, RANDOM  Final   Special Requests NONE  Final   Culture NO GROWTH  Final   Report Status 07/12/2016 FINAL  Final      Radiology Studies: Dg Lumbar Spine Complete  Result Date: 07/15/2016 CLINICAL DATA:  Right lower back pain for 6 days. EXAM: LUMBAR SPINE - COMPLETE 4+ VIEW COMPARISON:  CT abdomen and pelvis 07/11/2016 FINDINGS: There are 5 non rib bearing lumbar type vertebrae. A rudimentary disc is present at S1-2. Vertebral alignment is normal. Vertebral body heights are preserved without  evidence of fracture. Moderate disc space narrowing is present at L5-S1 with degenerative endplate spurring and evidence of vacuum disc phenomenon. Mild endplate spurring is present more proximally in the lumbar spine without significant disc space height loss. No destructive osseous lesion is identified. Extensive abdominal aortic calcified atherosclerosis is noted. IMPRESSION: 1. Lumbar disc degeneration greatest at L5-S1. No evidence acute osseous abnormality. 2. Aortic atherosclerosis. Electronically Signed   By: Seymour Bars.D.  On: 07/15/2016 16:00    Scheduled Meds: . amLODipine  5 mg Oral BID WC  . feeding supplement (NEPRO CARB STEADY)  237 mL Oral BID BM  . hydrALAZINE  75 mg Oral TID  . magnesium gluconate  500 mg Oral Once per day on Sun Tue Thu Sat  . pravastatin  40 mg Oral QPM   Continuous Infusions: . heparin 850 Units/hr (07/15/16 0640)     LOS: 4 days    Shaylinn Hladik, MD Pager: Text Page via [email or web address]  [phone]  If 7PM-7AM, please contact night-coverage [email or web address] Password [email or web address] 07/15/2016, 5:45 PM

## 2016-07-15 NOTE — Progress Notes (Signed)
ANTICOAGULATION CONSULT NOTE - Follow Up Consult  Pharmacy Consult for heparin Indication: atrial fibrillation  Allergies  Allergen Reactions  . Clonidine Derivatives Palpitations and Other (See Comments)    Very sedated  . Codeine Nausea Only  . Nickel Rash  . Sulfa Antibiotics Other (See Comments)    Reaction unknown    Patient Measurements: Height: 5' (152.4 cm) Weight: 151 lb 11.2 oz (68.8 kg) IBW/kg (Calculated) : 45.5  Vital Signs: Temp: 98 F (36.7 C) (02/28 1724) Temp Source: Oral (02/28 1724) BP: 156/63 (02/28 1724) Pulse Rate: 78 (02/28 1724)  Labs:  Recent Labs  07/13/16 0144 07/13/16 0747  07/14/16 0724  07/15/16 0453 07/15/16 1434 07/15/16 1930  HGB  --  11.8*  --   --   --  11.9*  --   --   HCT  --  36.9  --   --   --  36.7  --   --   PLT  --  290  --   --   --  298  --   --   APTT  --  84*  < >  --   < > 59* 80* 77*  HEPARINUNFRC >2.20*  --   --  >2.20*  --  1.80*  --   --   CREATININE  --  3.98*  --  3.81*  --  3.26*  --   --   TROPONINI <0.03 <0.03  --   --   --   --   --   --   < > = values in this interval not displayed.  Estimated Creatinine Clearance: 11.7 mL/min (by C-G formula based on SCr of 3.26 mg/dL (H)).   Medications:  Scheduled:  . amLODipine  5 mg Oral BID WC  . feeding supplement (NEPRO CARB STEADY)  237 mL Oral BID BM  . hydrALAZINE  75 mg Oral TID  . magnesium gluconate  500 mg Oral Once per day on Sun Tue Thu Sat  . pravastatin  40 mg Oral QPM    Assessment: 83yo female with AFib and on Apixaban, admitted with AKI. Currently on heparin bridge while holding Apixaban for renal function.  HL reflecting continued effect of Apixaban though lessening, slow to clear with AKI.  Repeat PTT following adjustment this AM is therapeutic.  Hg stable and pltc wnl.  No bleeding noted.  Recheck aPTT = 77, therapeutic on 850 units/hr  Goal of Therapy:  aPTT 66-102 seconds Monitor platelets by anticoagulation protocol: Yes   Plan:   Continue heparin 850 units/hr F/u AM labs   Maryanna Shape, PharmD, BCPS  Clinical Pharmacist  Pager: [phone]

## 2016-07-15 NOTE — Care Management Important Message (Signed)
Important Message  Patient Details  Name: Valerie Terrell MRN: [id number] Date of Birth: Jan 15, 1933   Medicare Important Message Given:  Yes    Valerie Terrell Valerie Terrell 07/15/2016, 10:39 AM

## 2016-07-15 NOTE — Progress Notes (Signed)
ANTICOAGULATION CONSULT NOTE - Follow Up Consult  Pharmacy Consult for heparin Indication: atrial fibrillation  Allergies  Allergen Reactions  . Clonidine Derivatives Palpitations and Other (See Comments)    Very sedated  . Codeine Nausea Only  . Nickel Rash  . Sulfa Antibiotics Other (See Comments)    Reaction unknown    Patient Measurements: Height: 5' (152.4 cm) Weight: 151 lb 11.2 oz (68.8 kg) IBW/kg (Calculated) : 45.5  Vital Signs: Temp: 98.6 F (37 C) (02/28 0907) Temp Source: Oral (02/28 0907) BP: 167/68 (02/28 0907) Pulse Rate: 75 (02/28 0907)  Labs:  Recent Labs  07/12/16 1900 07/13/16 0144 07/13/16 0747  07/14/16 0724  07/14/16 2023 07/15/16 0453 07/15/16 1434  HGB  --   --  11.8*  --   --   --   --  11.9*  --   HCT  --   --  36.9  --   --   --   --  36.7  --   PLT  --   --  290  --   --   --   --  298  --   APTT  --   --  84*  < >  --   < > 66* 59* 80*  HEPARINUNFRC  --  >2.20*  --   --  >2.20*  --   --  1.80*  --   CREATININE  --   --  3.98*  --  3.81*  --   --  3.26*  --   TROPONINI <0.03 <0.03 <0.03  --   --   --   --   --   --   < > = values in this interval not displayed.  Estimated Creatinine Clearance: 11.7 mL/min (by C-G formula based on SCr of 3.26 mg/dL (H)).   Medications:  Scheduled:  . amLODipine  5 mg Oral BID WC  . feeding supplement (NEPRO CARB STEADY)  237 mL Oral BID BM  . hydrALAZINE  75 mg Oral TID  . magnesium gluconate  500 mg Oral Once per day on Sun Tue Thu Sat  . pravastatin  40 mg Oral QPM    Assessment: 83yo female with AFib and on Apixaban, admitted with AKI. Currently on heparin bridge while holding Apixaban for renal function.  HL reflecting continued effect of Apixaban though lessening, slow to clear with AKI.  Repeat PTT following adjustment this AM is therapeutic.  Hg stable and pltc wnl.  No bleeding noted.  Goal of Therapy:  aPTT 66-102 seconds Monitor platelets by anticoagulation protocol: Yes   Plan:   Continue heparin 850 units/hr Repeat PTT in 6hr Change to daily monitoring once PTT therapeutic x 2   Gracy Bruins, [facility]

## 2016-07-16 ENCOUNTER — Inpatient Hospital Stay (HOSPITAL_COMMUNITY): Payer: Medicare Other

## 2016-07-16 DIAGNOSIS — S22000A Wedge compression fracture of unspecified thoracic vertebra, initial encounter for closed fracture: Secondary | ICD-10-CM

## 2016-07-16 HISTORY — DX: Wedge compression fracture of unspecified thoracic vertebra, initial encounter for closed fracture: S22.000A

## 2016-07-16 LAB — BASIC METABOLIC PANEL
Anion gap: 12 (ref 5–15)
BUN: 58 mg/dL — ABNORMAL HIGH (ref 6–20)
CHLORIDE: 101 mmol/L (ref 101–111)
CO2: 21 mmol/L — ABNORMAL LOW (ref 22–32)
Calcium: 8.7 mg/dL — ABNORMAL LOW (ref 8.9–10.3)
Creatinine, Ser: 2.84 mg/dL — ABNORMAL HIGH (ref 0.44–1.00)
GFR calc non Af Amer: 15 mL/min — ABNORMAL LOW (ref >60)
GFR, EST AFRICAN AMERICAN: 17 mL/min — AB (ref >60)
Glucose, Bld: 103 mg/dL — ABNORMAL HIGH (ref 65–99)
POTASSIUM: 3.9 mmol/L (ref 3.5–5.1)
SODIUM: 134 mmol/L — AB (ref 135–145)

## 2016-07-16 LAB — CBC
HCT: 37.8 % (ref 36.0–46.0)
HEMOGLOBIN: 12.3 g/dL (ref 12.0–15.0)
MCH: 29.7 pg (ref 26.0–34.0)
MCHC: 32.5 g/dL (ref 30.0–36.0)
MCV: 91.3 fL (ref 78.0–100.0)
Platelets: 310 10*3/uL (ref 150–400)
RBC: 4.14 MIL/uL (ref 3.87–5.11)
RDW: 14.8 % (ref 11.5–15.5)
WBC: 9.4 10*3/uL (ref 4.0–10.5)

## 2016-07-16 LAB — HEPARIN LEVEL (UNFRACTIONATED): HEPARIN UNFRACTIONATED: 1.76 [IU]/mL — AB (ref 0.30–0.70)

## 2016-07-16 LAB — APTT: APTT: 103 s — AB (ref 24–36)

## 2016-07-16 MED ORDER — MORPHINE SULFATE (PF) 2 MG/ML IV SOLN
1.0000 mg | INTRAVENOUS | Status: DC | PRN
  Administered 2016-07-16 – 2016-07-19 (×5): 2 mg via INTRAVENOUS
  Filled 2016-07-16 (×6): qty 1

## 2016-07-16 NOTE — Progress Notes (Signed)
ANTICOAGULATION CONSULT NOTE - Follow Up Consult  Pharmacy Consult for heparin Indication: atrial fibrillation  Allergies  Allergen Reactions  . Clonidine Derivatives Palpitations and Other (See Comments)    Very sedated  . Codeine Nausea Only  . Nickel Rash  . Sulfa Antibiotics Other (See Comments)    Reaction unknown    Patient Measurements: Height: 5' (152.4 cm) Weight: 151 lb 0.2 oz (68.5 kg) IBW/kg (Calculated) : 45.5  Vital Signs: Temp: 98.2 F (36.8 C) (03/01 0455) Temp Source: Oral (03/01 0455) BP: 158/97 (03/01 0455) Pulse Rate: 80 (03/01 0455)  Labs:  Recent Labs  07/14/16 0724  07/15/16 0453 07/15/16 1434 07/15/16 1930 07/16/16 0454  HGB  --   --  11.9*  --   --  12.3  HCT  --   --  36.7  --   --  37.8  PLT  --   --  298  --   --  310  APTT  --   < > 59* 80* 77* 103*  HEPARINUNFRC >2.20*  --  1.80*  --   --  1.76*  CREATININE 3.81*  --  3.26*  --   --  2.84*  < > = values in this interval not displayed.  Estimated Creatinine Clearance: 13.4 mL/min (by C-G formula based on SCr of 2.84 mg/dL (H)).   Medications:  Scheduled:  . amLODipine  5 mg Oral BID WC  . feeding supplement (NEPRO CARB STEADY)  237 mL Oral BID BM  . hydrALAZINE  75 mg Oral TID  . magnesium gluconate  500 mg Oral Once per day on Sun Tue Thu Sat  . pravastatin  40 mg Oral QPM    Assessment: 83yo female with AFib and on Apixaban, admitted with AKI. Currently on heparin bridge while holding Apixaban for renal function.  HL reflecting continued effect of Apixaban though lessening, slow to clear with AKI.   aPTT this morning is slightly elevated (aPTT 103 << 77, goal of 66-102 seconds). CBC stable - no bleeding noted. Confirmed labs drawn form opposite arm that heparin is running in.    Goal of Therapy:  aPTT 66-102 seconds Monitor platelets by anticoagulation protocol: Yes   Plan:  1. Reduce Heparin to 800 units/hr (8 ml/hr) 2. Will continue to monitor for any signs/symptoms  of bleeding and will follow up with aPTT level in the a.m.   Thank you for allowing pharmacy to be a part of this patient's care.  Alycia Rossetti, PharmD, BCPS Clinical Pharmacist Pager: [phone] Clinical phone for 07/16/2016 from 7a-3:30p: [phone] If after 3:30p, please call main pharmacy at: [phone] 07/16/2016 8:43 AM

## 2016-07-16 NOTE — Progress Notes (Signed)
PROGRESS NOTE [facility]   Valerie Terrell   VOZ:[id number] DOB: 12-20-1932  DOA: 07/11/2016 PCP: Tammi Sou, MD   Brief Narrative:  83-year-old female with medical history significant of PAF, solitary kidney, hypertension, hyperlipidemia, CVA presented back pain that started today prior to admission. She also report that had some episodes of diarrhea and generalized weakness a week prior to admission. In ED evaluation revealed elevated BUN and creatinine, she is admitted for acute on chronic renal failure. cxr-ray show pleural effusions with increasing vascular markings and cardiomegaly.  Subjective: Pt reportsno relief in back pain and some discomfort on changing position, x ray of the LS spine shows some disc degenerative disease. No chest pain or sob.   Assessment & Plan: Acute on CKD3 - Baseline Cr 1.4 - on chart review patient seem to be fluid overload rather than dehydration on initial CXR she had new pleural effusion with increase in vascular markings and cardiomegaly. Patient with 2+ LE pitting edema. ? Cardiorenal syndrome - Cr continues to improve with diuresis . Creatinine is 2.84 today, creatinine continues to improve but her leg edema is not improving  Much. Will probably need low dose of lasix to improve diuresis.  Nephrology recommendations appreciated.  Avoid nephrotoxins Renal US no hydronephrosis  ECHO done show diastolic dysfunction and small pericardial effusion without tamponade.  PAF: chadsvascscore 5 Rate controlled On IV heparin while in the hospital.   Back pain - likely musculoskeletal. Improving with robaxin. LS x ray show Lumbar disc degeneration greatest at L5-S1. No evidence acute osseous abnormality. Increase pain meds.  MRI spine without contrast shows L3-4: There is some ligamentum flavum thickening and mild facet degenerative change. Very small left foraminal protrusion is seen.The central canal and foramina are open. L4-5: Mild facet  degenerative change and ligamentum flavum thickening. The central canal and foramina are open.  L5-S1: Shallow central protrusion without central canal or foraminal Stenosis.  Added IV morphine, in addition to robaxin and vicodin .  Discussed the results with the patient, . Will request orthopedics to see if she needs steroid injection.    HTN BP stable.    DVT prophylaxis: heparin.   Code Status: FULL  Family Communication: None at bedside  Disposition Plan: Anticipate discharge back to home when Cr improve.    Consultants:   Nephrology - Dr. Justin Mend   Procedures:   Renal US   ECHO 2/27 Impressions:  - Compared to the prior study in 2017, the LVEF is higher at   60-65%. There is moderate LVH. The left atrium is severely   dilated. A small posterior pericardial effusion is noted without   tamponade features. There is diastolic dysfunction with elevated   left and right heart pressures.  Antimicrobials:  None     Objective: Vitals:   07/15/16 1724 07/15/16 2136 07/16/16 0455 07/16/16 1000  BP: (!) 156/63 (!) 149/77 (!) 158/97 (!) 148/88  Pulse: 78 76 80 84  Resp: 18 19 20 18   Temp: 98 F (36.7 C) 98.7 F (37.1 C) 98.2 F (36.8 C) 98 F (36.7 C)  TempSrc: Oral Oral Oral Oral  SpO2: 98% 90% 91% 98%  Weight:  68.5 kg (151 lb 0.2 oz)    Height:        Intake/Output Summary (Last 24 hours) at 07/16/16 1249 Last data filed at 07/16/16 0900  Gross per 24 hour  Intake           803.35 ml  Output  1135 ml  Net          -331.65 ml   Filed Weights   07/13/16 2100 07/14/16 2209 07/15/16 2136  Weight: 63.2 kg (139 lb 5.3 oz) 68.8 kg (151 lb 11.2 oz) 68.5 kg (151 lb 0.2 oz)    Examination:  General exam: NAD on O2 [place]  Respiratory system: Breath sound diminished mild crackles at the left base - improved  Cardiovascular system: S1S2 RRR +SM  Gastrointestinal system: Abdomen soft NDNT Central nervous system: Alert and oriented. Extremities: 1+  pitting pedal edema.  Skin: No rashes    Data Reviewed: I have personally reviewed following labs and imaging studies  CBC:  Recent Labs Lab 07/11/16 0853 07/12/16 0552 07/13/16 0747 07/15/16 0453 07/16/16 0454  WBC 8.7 8.0 7.3 7.4 9.4  NEUTROABS 6.6  --  5.4  --   --   HGB 12.6 11.6* 11.8* 11.9* 12.3  HCT 38.5 36.3 36.9 36.7 37.8  MCV 92.3 93.3 92.9 91.5 91.3  PLT 279 272 290 298 086   Basic Metabolic Panel:  Recent Labs Lab 07/12/16 0552 07/13/16 0747 07/13/16 1334 07/14/16 0724 07/15/16 0453 07/16/16 0454  NA 135 133*  --  134* 134* 134*  K 4.5 4.2  --  3.9 3.7 3.9  CL 106 104  --  103 102 101  CO2 19* 19*  --  20* 21* 21*  GLUCOSE 96 88  --  93 104* 103*  BUN 69* 65*  --  65* 63* 58*  CREATININE 4.08* 3.98*  --  3.81* 3.26* 2.84*  CALCIUM 7.8* 7.9*  --  8.3* 8.6* 8.7*  MG  --   --  1.7  --  1.5*  --   PHOS  --   --   --   --  5.0*  --    GFR: Estimated Creatinine Clearance: 13.4 mL/min (by C-G formula based on SCr of 2.84 mg/dL (H)). Liver Function Tests:  Recent Labs Lab 07/11/16 0853  AST 25  ALT 17  ALKPHOS 57  BILITOT 0.5  PROT 5.9*  ALBUMIN 2.7*    Recent Labs Lab 07/11/16 0853  LIPASE 39   No results for input(s): AMMONIA in the last 168 hours. Coagulation Profile:  Recent Labs Lab 07/11/16 0853  INR 1.35   Cardiac Enzymes:  Recent Labs Lab 07/12/16 1900 07/13/16 0144 07/13/16 0747  TROPONINI <0.03 <0.03 <0.03   BNP (last 3 results) No results for input(s): PROBNP in the last 8760 hours. HbA1C: No results for input(s): HGBA1C in the last 72 hours. CBG: No results for input(s): GLUCAP in the last 168 hours. Lipid Profile: No results for input(s): CHOL, HDL, LDLCALC, TRIG, CHOLHDL, LDLDIRECT in the last 72 hours. Thyroid Function Tests: No results for input(s): TSH, T4TOTAL, FREET4, T3FREE, THYROIDAB in the last 72 hours. Anemia Panel: No results for input(s): VITAMINB12, FOLATE, FERRITIN, TIBC, IRON, RETICCTPCT in  the last 72 hours. Sepsis Labs:  Recent Labs Lab 07/11/16 5784  LATICACIDVEN 0.67    Recent Results (from the past 240 hour(s))  Urine culture     Status: None   Collection Time: 07/11/16 11:05 AM  Result Value Ref Range Status   Specimen Description URINE, RANDOM  Final   Special Requests NONE  Final   Culture NO GROWTH  Final   Report Status 07/12/2016 FINAL  Final      Radiology Studies: Dg Lumbar Spine Complete  Result Date: 07/15/2016 CLINICAL DATA:  Right lower back pain for 6 days. EXAM:  LUMBAR SPINE - COMPLETE 4+ VIEW COMPARISON:  CT abdomen and pelvis 07/11/2016 FINDINGS: There are 5 non rib bearing lumbar type vertebrae. A rudimentary disc is present at S1-2. Vertebral alignment is normal. Vertebral body heights are preserved without evidence of fracture. Moderate disc space narrowing is present at L5-S1 with degenerative endplate spurring and evidence of vacuum disc phenomenon. Mild endplate spurring is present more proximally in the lumbar spine without significant disc space height loss. No destructive osseous lesion is identified. Extensive abdominal aortic calcified atherosclerosis is noted. IMPRESSION: 1. Lumbar disc degeneration greatest at L5-S1. No evidence acute osseous abnormality. 2. Aortic atherosclerosis. Electronically Signed   By: Logan Bores M.D.   On: 07/15/2016 16:00   Mr Lumbar Spine Wo Contrast  Result Date: 07/16/2016 CLINICAL DATA:  Low back pain over the past couple of weeks which has worsened in the past 2 days. No known injury. EXAM: MRI LUMBAR SPINE WITHOUT CONTRAST TECHNIQUE: Multiplanar, multisequence MR imaging of the lumbar spine was performed. No intravenous contrast was administered. COMPARISON:  Plain films lumbar spine 07/15/2016. CT abdomen and pelvis 07/11/2016. FINDINGS: Segmentation: Standard. Rudimentary disc material at S1-2 incidentally noted. Alignment:  Maintained. Vertebrae:  No fracture.  Marrow signal is somewhat heterogeneous. Conus  medullaris: Extends to the L3-4 Level and appears normal. Paraspinal and other soft tissues: T2 hyperintense lesion right kidney noted. The left kidney is markedly atrophic. Disc levels: T11-12 and T12-L1 are imaged in the sagittal plane only and negative. L1-2:  Negative. L2-3:  Negative. L3-4: There is some ligamentum flavum thickening and mild facet degenerative change. Very small left foraminal protrusion is seen. The central canal and foramina are open. L4-5: Mild facet degenerative change and ligamentum flavum thickening. The central canal and foramina are open. L5-S1: Shallow central protrusion without central canal or foraminal stenosis. IMPRESSION: Negative for fracture or other acute abnormality. Mild degenerative disease as described above. Electronically Signed   By: Inge Rise M.D.   On: 07/16/2016 12:44    Scheduled Meds: . amLODipine  5 mg Oral BID WC  . feeding supplement (NEPRO CARB STEADY)  237 mL Oral BID BM  . hydrALAZINE  75 mg Oral TID  . magnesium gluconate  500 mg Oral Once per day on Sun Tue Thu Sat  . pravastatin  40 mg Oral QPM   Continuous Infusions: . heparin 800 Units/hr (07/16/16 0849)     LOS: 5 days    Jocilynn Grade, MD Pager: Text Page via [email or web address]  [phone]  If 7PM-7AM, please contact night-coverage [email or web address] Password [facility] 07/16/2016, 12:49 PM

## 2016-07-17 LAB — CBC
HEMATOCRIT: 31.8 % — AB (ref 36.0–46.0)
HEMOGLOBIN: 10.4 g/dL — AB (ref 12.0–15.0)
MCH: 30 pg (ref 26.0–34.0)
MCHC: 32.7 g/dL (ref 30.0–36.0)
MCV: 91.6 fL (ref 78.0–100.0)
Platelets: 234 10*3/uL (ref 150–400)
RBC: 3.47 MIL/uL — ABNORMAL LOW (ref 3.87–5.11)
RDW: 15.2 % (ref 11.5–15.5)
WBC: 5.9 10*3/uL (ref 4.0–10.5)

## 2016-07-17 LAB — BASIC METABOLIC PANEL
ANION GAP: 12 (ref 5–15)
BUN: 53 mg/dL — AB (ref 6–20)
CHLORIDE: 101 mmol/L (ref 101–111)
CO2: 22 mmol/L (ref 22–32)
Calcium: 8.5 mg/dL — ABNORMAL LOW (ref 8.9–10.3)
Creatinine, Ser: 2.47 mg/dL — ABNORMAL HIGH (ref 0.44–1.00)
GFR calc Af Amer: 20 mL/min — ABNORMAL LOW (ref >60)
GFR, EST NON AFRICAN AMERICAN: 17 mL/min — AB (ref >60)
GLUCOSE: 85 mg/dL (ref 65–99)
POTASSIUM: 3.7 mmol/L (ref 3.5–5.1)
Sodium: 135 mmol/L (ref 135–145)

## 2016-07-17 LAB — APTT: aPTT: 66 seconds — ABNORMAL HIGH (ref 24–36)

## 2016-07-17 LAB — HEPARIN LEVEL (UNFRACTIONATED): HEPARIN UNFRACTIONATED: 1.24 [IU]/mL — AB (ref 0.30–0.70)

## 2016-07-17 MED ORDER — METHYLPREDNISOLONE 4 MG PO TABS
8.0000 mg | ORAL_TABLET | Freq: Once | ORAL | Status: AC
  Administered 2016-07-17: 8 mg via ORAL
  Filled 2016-07-17 (×2): qty 2

## 2016-07-17 MED ORDER — METHYLPREDNISOLONE 4 MG PO TBPK
8.0000 mg | ORAL_TABLET | Freq: Every evening | ORAL | Status: AC
  Administered 2016-07-18: 8 mg via ORAL

## 2016-07-17 MED ORDER — METHYLPREDNISOLONE 4 MG PO TBPK
4.0000 mg | ORAL_TABLET | ORAL | Status: AC
  Administered 2016-07-17: 4 mg via ORAL

## 2016-07-17 MED ORDER — METHYLPREDNISOLONE 4 MG PO TBPK: 8.0000 mg | ORAL_TABLET | Freq: Every morning | ORAL | Status: DC

## 2016-07-17 MED ORDER — METHYLPREDNISOLONE 4 MG PO TBPK
4.0000 mg | ORAL_TABLET | Freq: Four times a day (QID) | ORAL | Status: DC
  Administered 2016-07-19 – 2016-07-20 (×6): 4 mg via ORAL
  Filled 2016-07-17: qty 21

## 2016-07-17 MED ORDER — METHYLPREDNISOLONE 4 MG PO TBPK
4.0000 mg | ORAL_TABLET | Freq: Three times a day (TID) | ORAL | Status: AC
  Administered 2016-07-18 (×3): 4 mg via ORAL

## 2016-07-17 MED ORDER — METHYLPREDNISOLONE 4 MG PO TBPK
8.0000 mg | ORAL_TABLET | Freq: Every evening | ORAL | Status: AC
  Administered 2016-07-17: 8 mg via ORAL

## 2016-07-17 MED ORDER — METHYLPREDNISOLONE 4 MG PO TBPK
4.0000 mg | ORAL_TABLET | ORAL | Status: AC
  Administered 2016-07-17: 4 mg via ORAL
  Filled 2016-07-17: qty 21

## 2016-07-17 MED ORDER — ZOLPIDEM TARTRATE 5 MG PO TABS
2.5000 mg | ORAL_TABLET | Freq: Every evening | ORAL | Status: DC | PRN
  Administered 2016-07-19: 2.5 mg via ORAL
  Filled 2016-07-17: qty 1

## 2016-07-17 NOTE — Progress Notes (Signed)
PROGRESS NOTE [facility]   Valerie Terrell   MPN:[id number] DOB: 07-31-1932  DOA: 07/11/2016 PCP: Tammi Sou, MD   Brief Narrative:  83-year-old female with medical history significant of PAF, solitary kidney, hypertension, hyperlipidemia, CVA presented back pain that started today prior to admission. She also report that had some episodes of diarrhea and generalized weakness a week prior to admission. In ED evaluation revealed elevated BUN and creatinine, she is admitted for acute on chronic renal failure. cxr-ray show pleural effusions with increasing vascular markings and cardiomegaly.  Subjective: Mild relief of back pain, MRI shows degenrative disease, discussed the results with the patient and daughter.  No chest pain or sob.   Assessment & Plan: Acute on CKD3 - Baseline Cr 1.4 - on chart review patient seem to be fluid overload rather than dehydration on initial CXR she had new pleural effusion with increase in vascular markings and cardiomegaly. Patient with 2+ LE pitting edema. Improving.creatinine also continues to improve with diuresis . Creatinine is 2.4 today,.   Nephrology recommendations appreciated.  Avoid nephrotoxins Renal US no hydronephrosis  ECHO done show diastolic dysfunction and small pericardial effusion without tamponade.  PAF: chadsvascscore 5 Rate controlled On IV heparin while in the hospital. Can switch her back to eliquis on discharge.   Back pain - likely musculoskeletal. Improving with robaxin. LS x ray show Lumbar disc degeneration greatest at L5-S1. No evidence acute osseous abnormality. Increase pain meds.  MRI spine without contrast shows L3-4: There is some ligamentum flavum thickening and mild facet degenerative change. Very small left foraminal protrusion is seen.The central canal and foramina are open. L4-5: Mild facet degenerative change and ligamentum flavum thickening. The central canal and foramina are open.  L5-S1: Shallow  central protrusion without central canal or foraminal Stenosis.  Added IV morphine, in addition to robaxin and vicodin .  Discussed the results with the patient, .  Orthopedics consulted and medrol dose pack ordered.    HTN BP stable.    DVT prophylaxis: heparin.   Code Status: FULL  Family Communication: daughter at bedside.  Disposition Plan: Anticipate discharge back to home when Cr improve.  And back pain improves.   Consultants:   Nephrology - Dr. Justin Mend   Procedures:   Renal US   ECHO 2/27 Impressions:  - Compared to the prior study in 2017, the LVEF is higher at   60-65%. There is moderate LVH. The left atrium is severely   dilated. A small posterior pericardial effusion is noted without   tamponade features. There is diastolic dysfunction with elevated   left and right heart pressures.  Antimicrobials:  None     Objective: Vitals:   07/16/16 2031 07/16/16 2147 07/17/16 0300 07/17/16 0841  BP: (!) 144/52  (!) 169/60 (!) 162/58  Pulse: 70  83 74  Resp: 16  16 14   Temp: 98.9 F (37.2 C)  98.3 F (36.8 C) 98.4 F (36.9 C)  TempSrc:    Oral  SpO2: 93%  93% 93%  Weight:  68 kg (150 lb)    Height:        Intake/Output Summary (Last 24 hours) at 07/17/16 1534 Last data filed at 07/17/16 1408  Gross per 24 hour  Intake           857.27 ml  Output              375 ml  Net           482.27 ml   Filed  Weights   07/14/16 2209 07/15/16 2136 07/16/16 2147  Weight: 68.8 kg (151 lb 11.2 oz) 68.5 kg (151 lb 0.2 oz) 68 kg (150 lb)    Examination:  General exam: NAD on O2 [place]  Respiratory system: good air entry . No wheezing heard.   Cardiovascular system: S1S2 RRR +SM  Gastrointestinal system: Abdomen soft NDNT Central nervous system: Alert and oriented. Extremities: 1+ pitting pedal edema.  Skin: No rashes    Data Reviewed: I have personally reviewed following labs and imaging studies  CBC:  Recent Labs Lab 07/11/16 0853 07/12/16 0552  07/13/16 0747 07/15/16 0453 07/16/16 0454 07/17/16 0513  WBC 8.7 8.0 7.3 7.4 9.4 5.9  NEUTROABS 6.6  --  5.4  --   --   --   HGB 12.6 11.6* 11.8* 11.9* 12.3 10.4*  HCT 38.5 36.3 36.9 36.7 37.8 31.8*  MCV 92.3 93.3 92.9 91.5 91.3 91.6  PLT 279 272 290 298 310 539   Basic Metabolic Panel:  Recent Labs Lab 07/13/16 0747 07/13/16 1334 07/14/16 0724 07/15/16 0453 07/16/16 0454 07/17/16 0513  NA 133*  --  134* 134* 134* 135  K 4.2  --  3.9 3.7 3.9 3.7  CL 104  --  103 102 101 101  CO2 19*  --  20* 21* 21* 22  GLUCOSE 88  --  93 104* 103* 85  BUN 65*  --  65* 63* 58* 53*  CREATININE 3.98*  --  3.81* 3.26* 2.84* 2.47*  CALCIUM 7.9*  --  8.3* 8.6* 8.7* 8.5*  MG  --  1.7  --  1.5*  --   --   PHOS  --   --   --  5.0*  --   --    GFR: Estimated Creatinine Clearance: 15.4 mL/min (by C-G formula based on SCr of 2.47 mg/dL (H)). Liver Function Tests:  Recent Labs Lab 07/11/16 0853  AST 25  ALT 17  ALKPHOS 57  BILITOT 0.5  PROT 5.9*  ALBUMIN 2.7*    Recent Labs Lab 07/11/16 0853  LIPASE 39   No results for input(s): AMMONIA in the last 168 hours. Coagulation Profile:  Recent Labs Lab 07/11/16 0853  INR 1.35   Cardiac Enzymes:  Recent Labs Lab 07/12/16 1900 07/13/16 0144 07/13/16 0747  TROPONINI <0.03 <0.03 <0.03   BNP (last 3 results) No results for input(s): PROBNP in the last 8760 hours. HbA1C: No results for input(s): HGBA1C in the last 72 hours. CBG: No results for input(s): GLUCAP in the last 168 hours. Lipid Profile: No results for input(s): CHOL, HDL, LDLCALC, TRIG, CHOLHDL, LDLDIRECT in the last 72 hours. Thyroid Function Tests: No results for input(s): TSH, T4TOTAL, FREET4, T3FREE, THYROIDAB in the last 72 hours. Anemia Panel: No results for input(s): VITAMINB12, FOLATE, FERRITIN, TIBC, IRON, RETICCTPCT in the last 72 hours. Sepsis Labs:  Recent Labs Lab 07/11/16 7673  LATICACIDVEN 0.67    Recent Results (from the past 240 hour(s))   Urine culture     Status: None   Collection Time: 07/11/16 11:05 AM  Result Value Ref Range Status   Specimen Description URINE, RANDOM  Final   Special Requests NONE  Final   Culture NO GROWTH  Final   Report Status 07/12/2016 FINAL  Final      Radiology Studies: Mr Lumbar Spine Wo Contrast  Result Date: 07/16/2016 CLINICAL DATA:  Low back pain over the past couple of weeks which has worsened in the past 2 days. No known injury.  EXAM: MRI LUMBAR SPINE WITHOUT CONTRAST TECHNIQUE: Multiplanar, multisequence MR imaging of the lumbar spine was performed. No intravenous contrast was administered. COMPARISON:  Plain films lumbar spine 07/15/2016. CT abdomen and pelvis 07/11/2016. FINDINGS: Segmentation: Standard. Rudimentary disc material at S1-2 incidentally noted. Alignment:  Maintained. Vertebrae:  No fracture.  Marrow signal is somewhat heterogeneous. Conus medullaris: Extends to the L3-4 Level and appears normal. Paraspinal and other soft tissues: T2 hyperintense lesion right kidney noted. The left kidney is markedly atrophic. Disc levels: T11-12 and T12-L1 are imaged in the sagittal plane only and negative. L1-2:  Negative. L2-3:  Negative. L3-4: There is some ligamentum flavum thickening and mild facet degenerative change. Very small left foraminal protrusion is seen. The central canal and foramina are open. L4-5: Mild facet degenerative change and ligamentum flavum thickening. The central canal and foramina are open. L5-S1: Shallow central protrusion without central canal or foraminal stenosis. IMPRESSION: Negative for fracture or other acute abnormality. Mild degenerative disease as described above. Electronically Signed   By: Inge Rise M.D.   On: 07/16/2016 12:44    Scheduled Meds: . amLODipine  5 mg Oral BID WC  . feeding supplement (NEPRO CARB STEADY)  237 mL Oral BID BM  . hydrALAZINE  75 mg Oral TID  . magnesium gluconate  500 mg Oral Once per day on Sun Tue Thu Sat  .  methylPREDNISolone  4 mg Oral PC supper  . [START ON 07/18/2016] methylPREDNISolone  4 mg Oral 3 x daily with food  . [START ON 07/19/2016] methylPREDNISolone  4 mg Oral 4X daily taper  . methylPREDNISolone  8 mg Oral Nightly  . [START ON 07/18/2016] methylPREDNISolone  8 mg Oral Nightly  . pravastatin  40 mg Oral QPM   Continuous Infusions: . heparin 800 Units/hr (07/17/16 0212)     LOS: 6 days    Carlina Derks, MD Pager: Text Page via [email or web address]  [phone]  If 7PM-7AM, please contact night-coverage [email or web address] Password [facility] 07/17/2016, 3:34 PM

## 2016-07-17 NOTE — Progress Notes (Signed)
Pt on Methyprednisolone taper packet. Pt had one pill today and was supposed to have a total of 6. This nurse called pharmacy and spoke with Aniceto Boss and was told to give pt all 6 pills. Pt did not want to do this. This nurse called and spoke with on call MD Olevia Bowens and he said to give pt 2 if she will take them. This nurse will call pharmacy to inform of changing dose pack.   Eleanora Neighbor, RN

## 2016-07-17 NOTE — Care Management Important Message (Signed)
Important Message  Patient Details  Name: Valerie Terrell MRN: [id number] Date of Birth: [birth year]   Medicare Important Message Given:  Yes    Lauris Keepers [address] 07/17/2016, 3:54 PM

## 2016-07-17 NOTE — Progress Notes (Signed)
Nutrition Follow-up  DOCUMENTATION CODES:   Not applicable  INTERVENTION:  Continue Nepro Shake po BID, each supplement provides 425 kcal and 19 grams protein  Provide nourishment snacks (ordered)  Encourage adequate PO intake.   NUTRITION DIAGNOSIS:   Increased nutrient needs related to acute illness as evidenced by estimated needs; ongoing  GOAL:   Patient will meet greater than or equal to 90% of their needs; progressing  MONITOR:   PO intake, Supplement acceptance, Labs, Weight trends, Skin, I & O's  REASON FOR ASSESSMENT:   Malnutrition Screening Tool    ASSESSMENT:   83 y.o. female with medical history significant of PAF on Eliquis, solitary kidney, HTN, HLD, CVA; who presented with 2 day history of back pain. Submitted for further management of acute on chronic renal failure  Meal completion has been 30-100% with 50% at lunch today. Pt reports appetite has improved. Noted pt with multiple snacks at bedside. Pt currently has Nepro shake ordered with varied consumption. RD to continue with current orders.   Labs and medications reviewed.   Diet Order:  Diet Heart Room service appropriate? Yes; Fluid consistency: Thin  Skin:  Reviewed, no issues  Last BM:  2/27  Height:   Ht Readings from Last 1 Encounters:  07/11/16 5' (1.524 m)    Weight:   Wt Readings from Last 1 Encounters:  07/16/16 150 lb (68 kg)    Ideal Body Weight:  45.45 kg  BMI:  Body mass index is 29.29 kg/m.  Estimated Nutritional Needs:   Kcal:  1650-1850  Protein:  75-85 grams  Fluid:  1.6 - 1.8 L/day  EDUCATION NEEDS:   No education needs identified at this time  Corrin Parker, MS, RD, LDN Pager # [phone] After hours/ weekend pager # [phone]

## 2016-07-17 NOTE — Progress Notes (Signed)
ANTICOAGULATION CONSULT NOTE - Follow Up Consult  Pharmacy Consult for heparin Indication: atrial fibrillation  Allergies  Allergen Reactions  . Clonidine Derivatives Palpitations and Other (See Comments)    Very sedated  . Codeine Nausea Only  . Nickel Rash  . Sulfa Antibiotics Other (See Comments)    Reaction unknown    Patient Measurements: Height: 5' (152.4 cm) Weight: 150 lb (68 kg) IBW/kg (Calculated) : 45.5  Vital Signs: Temp: 98.3 F (36.8 C) (03/02 0300) BP: 169/60 (03/02 0300) Pulse Rate: 83 (03/02 0300)  Labs:  Recent Labs  07/15/16 0453  07/15/16 1930 07/16/16 0454 07/17/16 0513  HGB 11.9*  --   --  12.3 10.4*  HCT 36.7  --   --  37.8 31.8*  PLT 298  --   --  310 234  APTT 59*  < > 77* 103* 66*  HEPARINUNFRC 1.80*  --   --  1.76* 1.24*  CREATININE 3.26*  --   --  2.84* 2.47*  < > = values in this interval not displayed.  Estimated Creatinine Clearance: 15.4 mL/min (by C-G formula based on SCr of 2.47 mg/dL (H)).   Medications:  Scheduled:  . amLODipine  5 mg Oral BID WC  . feeding supplement (NEPRO CARB STEADY)  237 mL Oral BID BM  . hydrALAZINE  75 mg Oral TID  . magnesium gluconate  500 mg Oral Once per day on Sun Tue Thu Sat  . pravastatin  40 mg Oral QPM    Assessment: 83yo female with AFib and on Apixaban, admitted with AKI. Currently on heparin bridge while holding Apixaban for renal function.  HL reflecting continued effect of Apixaban though lessening, slow to clear with AKI.   aPTT this morning is therapeutic (aPTT 66 << 103, goal of 66-102 seconds). Hgb/Hct slight drop, 10.4 << 12.3, plts wnl.  Per RN report - no bleeding issues at this time. Will monitor.  Goal of Therapy:  aPTT 66-102 seconds Monitor platelets by anticoagulation protocol: Yes   Plan:  1. Continue Heparin at 800 units/hr (8 ml/hr) 2. Will continue to monitor for any signs/symptoms of bleeding and will follow up with aPTT level in the a.m.   Thank you for  allowing pharmacy to be a part of this patient's care.  Alycia Rossetti, PharmD, BCPS Clinical Pharmacist Pager: [phone] Clinical phone for 07/17/2016 from 7a-3:30p: [phone] If after 3:30p, please call main pharmacy at: [phone] 07/17/2016 8:40 AM

## 2016-07-18 LAB — BASIC METABOLIC PANEL
Anion gap: 11 (ref 5–15)
BUN: 50 mg/dL — AB (ref 6–20)
CO2: 22 mmol/L (ref 22–32)
Calcium: 9 mg/dL (ref 8.9–10.3)
Chloride: 102 mmol/L (ref 101–111)
Creatinine, Ser: 2.11 mg/dL — ABNORMAL HIGH (ref 0.44–1.00)
GFR, EST AFRICAN AMERICAN: 24 mL/min — AB (ref >60)
GFR, EST NON AFRICAN AMERICAN: 21 mL/min — AB (ref >60)
Glucose, Bld: 125 mg/dL — ABNORMAL HIGH (ref 65–99)
POTASSIUM: 4.4 mmol/L (ref 3.5–5.1)
SODIUM: 135 mmol/L (ref 135–145)

## 2016-07-18 LAB — CBC
HCT: 33.9 % — ABNORMAL LOW (ref 36.0–46.0)
Hemoglobin: 10.8 g/dL — ABNORMAL LOW (ref 12.0–15.0)
MCH: 29.3 pg (ref 26.0–34.0)
MCHC: 31.9 g/dL (ref 30.0–36.0)
MCV: 91.9 fL (ref 78.0–100.0)
PLATELETS: 252 10*3/uL (ref 150–400)
RBC: 3.69 MIL/uL — AB (ref 3.87–5.11)
RDW: 14.9 % (ref 11.5–15.5)
WBC: 3.6 10*3/uL — AB (ref 4.0–10.5)

## 2016-07-18 LAB — HEPARIN LEVEL (UNFRACTIONATED): HEPARIN UNFRACTIONATED: 1.14 [IU]/mL — AB (ref 0.30–0.70)

## 2016-07-18 LAB — APTT: APTT: 73 s — AB (ref 24–36)

## 2016-07-18 MED ORDER — APIXABAN 2.5 MG PO TABS
2.5000 mg | ORAL_TABLET | Freq: Two times a day (BID) | ORAL | Status: DC
  Administered 2016-07-18 – 2016-07-20 (×5): 2.5 mg via ORAL
  Filled 2016-07-18 (×5): qty 1

## 2016-07-18 MED ORDER — PANTOPRAZOLE SODIUM 40 MG PO TBEC
40.0000 mg | DELAYED_RELEASE_TABLET | Freq: Every day | ORAL | Status: DC
  Administered 2016-07-18 – 2016-07-20 (×3): 40 mg via ORAL
  Filled 2016-07-18 (×3): qty 1

## 2016-07-18 NOTE — Progress Notes (Addendum)
ANTICOAGULATION CONSULT NOTE - Follow Up Consult  Pharmacy Consult for heparin Indication: atrial fibrillation  Allergies  Allergen Reactions  . Clonidine Derivatives Palpitations and Other (See Comments)    Very sedated  . Codeine Nausea Only  . Nickel Rash  . Sulfa Antibiotics Other (See Comments)    Reaction unknown    Patient Measurements: Height: 5' (152.4 cm) Weight: 151 lb (68.5 kg) IBW/kg (Calculated) : 45.5  Vital Signs: Temp: 98.5 F (36.9 C) (03/03 0436) BP: 160/64 (03/03 0436) Pulse Rate: 81 (03/03 0436)  Labs:  Recent Labs  07/16/16 0454 07/17/16 0513 07/18/16 0549  HGB 12.3 10.4* 10.8*  HCT 37.8 31.8* 33.9*  PLT 310 234 252  APTT 103* 66* 73*  HEPARINUNFRC 1.76* 1.24* 1.14*  CREATININE 2.84* 2.47* 2.11*    Estimated Creatinine Clearance: 18.1 mL/min (by C-G formula based on SCr of 2.11 mg/dL (H)).   Medications:  Scheduled:  . amLODipine  5 mg Oral BID WC  . feeding supplement (NEPRO CARB STEADY)  237 mL Oral BID BM  . hydrALAZINE  75 mg Oral TID  . magnesium gluconate  500 mg Oral Once per day on Sun Tue Thu Sat  . methylPREDNISolone  4 mg Oral 3 x daily with food  . [START ON 07/19/2016] methylPREDNISolone  4 mg Oral 4X daily taper  . methylPREDNISolone  8 mg Oral Nightly  . pravastatin  40 mg Oral QPM    Assessment: 83yo female with AFib and on Apixaban, admitted with AKI. Currently on heparin bridge while holding Apixaban for renal function.  HL reflecting continued effect of Apixaban though lessening, slow to clear with AKI.    aPTT this morning is therapeutic (up slightly). Hgb stable from yesterday, plts wnl. No bleeding noted.   Goal of Therapy:  aPTT 66-102 seconds Monitor platelets by anticoagulation protocol: Yes   Plan:  1. Continue Heparin at 800 units/hr  2. Daily aPTT/HL 3. Monitor for bleeding  Thank you for allowing pharmacy to be a part of this patient's care.  Dierdre Harness, Cain Sieve, PharmD Clinical Pharmacy  Resident [phone] (Pager) 07/18/2016 8:43 AM  Addendum Pharmacy consulted to restart PTA eliquis. Due to poor, but improving renal function, will restart at reduced dose eliquis. No bleeding noted, CBC as above.   Plan: Stop Heparin Start Eliquis 2.5mg  by mouth twice daily immediately after heparin is stopped Monitor CBC q72h, renal function, and s/sx of bleeding  Dierdre Harness, BS, PharmD Clinical Pharmacy Resident [phone] (Pager) 07/18/2016 12:26 PM

## 2016-07-18 NOTE — Progress Notes (Signed)
Physical Therapy Evaluation Patient Details Name: Valerie Terrell MRN: [id number] DOB: 19-Apr-1933 Today's Date: 07/18/2016   History of Present Illness  83 y.o. female with medical history significant of PAF on Eliquis, solitary kidney, HTN, HLD, CVA; who presents with back pain.  Clinical Impression  Patient reports that her pain and leg swelling is improving significantly since predinsone started recently, feels she is doing much better, pain decreasing, still weak with low energy.  Therapy assessment with MIN Guard to Supervision overall room level mobility with deficits noted for activity tolerance (50 feet to fatigue), and decreased balance (mild).  Patient recommended for temporary use of assistive device, cane or walker initially to help with energy and balance, and for possibility of Home Health therapy, but patient states she thinks she will be able to recover with normal activity and time when she gets home.  Patient is appropriate to continue with skilled PT services acutely, and will remain on PT roster to address stairs, energy and balance, but is ok to transition when medically stable.     Follow Up Recommendations Supervision - Intermittent;No PT follow up (per patient request)    Equipment Recommendations  Cane (for temporary PRN use.)    Recommendations for Other Services       Precautions / Restrictions Precautions Precautions: Fall Restrictions Weight Bearing Restrictions: No      Mobility  Bed Mobility Overal bed mobility: Modified Independent             General bed mobility comments: Patient reports 'was not able to do that yesterday'  Transfers Overall transfer level: Needs assistance Equipment used: Rolling walker (2 wheeled);None Transfers: Sit to/from Stand Sit to Stand: Supervision         General transfer comment: near loss of balance with turning, posterior direction  Ambulation/Gait Ambulation/Gait assistance: Supervision;Min  guard Ambulation Distance (Feet): 50 Feet (2x ) Assistive device: Rolling walker (2 wheeled);None (Once with and without device)   Gait velocity: slow   General Gait Details: Patient reports balance is 'shaky' for past year or so.  Stairs            Wheelchair Mobility    Modified Rankin (Stroke Patients Only)       Balance Overall balance assessment: Needs assistance Sitting-balance support: No upper extremity supported Sitting balance-Leahy Scale: Good       Standing balance-Leahy Scale: Fair       Tandem Stance - Right Leg: 15 (seconds (semi-tandem)) Tandem Stance - Left Leg: 7 (seconds (semi-tandem))   Rhomberg - Eyes Closed: 15 (steps with one near loss of balance.)   High Level Balance Comments: Balance worse with narrow base of support or dynamic motion with head movements.  Patient reports she can't lie down fully in dentist chair (at end of session).             Pertinent Vitals/Pain Pain Assessment: 0-10 Pain Score: 3  Pain Location: Back Pain Descriptors / Indicators: Sore Pain Intervention(s): Monitored during session (Patient reports pain better since prednisone yesterday.)    Home Living Family/patient expects to be discharged to:: Private residence Living Arrangements: Children Available Help at Discharge: Family;Available 24 hours/day Type of Home: House Home Access: Stairs to enter Entrance Stairs-Rails: None Entrance Stairs-Number of Steps: 3-4 Home Layout: One level Home Equipment: None      Prior Function Level of Independence: Independent         Comments: 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, newspaper  Dominant Hand: Right    Extremity/Trunk Assessment   Upper Extremity Assessment Upper Extremity Assessment: Overall WFL for tasks assessed    Lower Extremity Assessment Lower Extremity Assessment: Generalized weakness;Overall [facility] for tasks assessed    Cervical / Trunk Assessment Cervical / Trunk  Assessment: Normal  Communication   Communication: No difficulties  Cognition Arousal/Alertness: Awake/alert Behavior During Therapy: WFL for tasks assessed/performed Overall Cognitive Status: Within Functional Limits for tasks assessed                      General Comments      Exercises     Assessment/Plan    PT Assessment All further PT needs can be met in the next venue of care (Patient declined therapy once home (for now).)  PT Problem List Decreased strength;Decreased activity tolerance;Decreased balance;Decreased mobility;Decreased knowledge of use of DME;Cardiopulmonary status limiting activity;Pain       PT Treatment Interventions      PT Goals (Current goals can be found in the Care Plan section)  Acute Rehab PT Goals Patient Stated Goal: Get better again PT Goal Formulation: With patient Time For Goal Achievement: 08/01/16 Potential to Achieve Goals: Good    Frequency     Barriers to discharge        Co-evaluation               End of Session Equipment Utilized During Treatment: Gait belt Activity Tolerance: Patient tolerated treatment well (Back pain started increasing at end of session.) Patient left: in bed;with call bell/phone within reach Nurse Communication: Mobility status PT Visit Diagnosis: Muscle weakness (generalized) (M62.81);Other abnormalities of gait and mobility (R26.89)         Time: 1425-1505 PT Time Calculation (min) (ACUTE ONLY): 40 min   Charges:   PT Evaluation $PT Eval Moderate Complexity: 1 Procedure PT Treatments $Neuromuscular Re-education: 8-22 mins   PT G Codes:         Leonarda Leis L 08-15-16, 3:22 PM

## 2016-07-18 NOTE — Progress Notes (Signed)
PROGRESS NOTE [facility]   Valerie Terrell   KCL:[id number] DOB: July 24, 1932  DOA: 07/11/2016 PCP: Tammi Sou, MD   Brief Narrative:  83-year-old female with medical history significant of PAF, solitary kidney, hypertension, hyperlipidemia, CVA presented back pain that started today prior to admission. She also report that had some episodes of diarrhea and generalized weakness a week prior to admission. In ED evaluation revealed elevated BUN and creatinine, she is admitted for acute on chronic renal failure. cxr-ray show pleural effusions with increasing vascular markings and cardiomegaly.  Subjective: Mild relief of back pain, with medrol dose pack.  No chest pain or sob.   Assessment & Plan: Acute on CKD3 - Baseline Cr 1.4 - on chart review patient seem to be fluid overload rather than dehydration on initial CXR she had new pleural effusion with increase in vascular markings and cardiomegaly. Patient with 2+ LE pitting edema. Improving.creatinine also continues to improve with diuresis . Creatinine is 2.11 today,.   Nephrology recommendations appreciated.  Avoid nephrotoxins Renal US no hydronephrosis  ECHO done show diastolic dysfunction and small pericardial effusion without tamponade.  PAF: chadsvascscore 5 Rate controlled On IV heparin while in the hospital. Can switch her back to eliquis.  Back pain - likely musculoskeletal. Improving with robaxin. LS x ray show Lumbar disc degeneration greatest at L5-S1. No evidence acute osseous abnormality. Increase pain meds.  MRI spine without contrast shows L3-4: There is some ligamentum flavum thickening and mild facet degenerative change. Very small left foraminal protrusion is seen.The central canal and foramina are open. L4-5: Mild facet degenerative change and ligamentum flavum thickening. The central canal and foramina are open.  L5-S1: Shallow central protrusion without central canal or foraminal Stenosis.  Added  IV morphine, in addition to robaxin and vicodin .  Discussed the results with the patient, .  Orthopedics consulted and medrol dose pack ordered. Outpatient follow up with Dr Percell Miller as recommended.    HTN BP stable.    DVT prophylaxis: heparin.   Code Status: FULL  Family Communication: daughter at bedside.  Disposition Plan: Anticipate discharge back to home when Cr improve.  And back pain improves.   Consultants:   Nephrology - Dr. Justin Mend   Procedures:   Renal US   ECHO 2/27 Impressions:  - Compared to the prior study in 2017, the LVEF is higher at   60-65%. There is moderate LVH. The left atrium is severely   dilated. A small posterior pericardial effusion is noted without   tamponade features. There is diastolic dysfunction with elevated   left and right heart pressures.  Antimicrobials:  None     Objective: Vitals:   07/17/16 2043 07/18/16 0436 07/18/16 0900 07/18/16 1707  BP: (!) 171/55 (!) 160/64 (!) 162/60 (!) 149/53  Pulse: 76 81 79 74  Resp: (!) 21 17 18 18   Temp: 98 F (36.7 C) 98.5 F (36.9 C) 98.1 F (36.7 C) 98 F (36.7 C)  TempSrc:   Oral Oral  SpO2: 95% 95% 98% 95%  Weight: 68.5 kg (151 lb)     Height:        Intake/Output Summary (Last 24 hours) at 07/18/16 1854 Last data filed at 07/18/16 1300  Gross per 24 hour  Intake              792 ml  Output              450 ml  Net  342 ml   Filed Weights   07/15/16 2136 07/16/16 2147 07/17/16 2043  Weight: 68.5 kg (151 lb 0.2 oz) 68 kg (150 lb) 68.5 kg (151 lb)    Examination:  General exam: NAD on O2 [place]  Respiratory system: good air entry . No wheezing heard.   Cardiovascular system: S1S2 RRR +SM  Gastrointestinal system: Abdomen soft NDNT Central nervous system: Alert and oriented. Extremities: 1+ pitting pedal edema.  Skin: No rashes    Data Reviewed: I have personally reviewed following labs and imaging studies  CBC:  Recent Labs Lab 07/13/16 0747  07/15/16 0453 07/16/16 0454 07/17/16 0513 07/18/16 0549  WBC 7.3 7.4 9.4 5.9 3.6*  NEUTROABS 5.4  --   --   --   --   HGB 11.8* 11.9* 12.3 10.4* 10.8*  HCT 36.9 36.7 37.8 31.8* 33.9*  MCV 92.9 91.5 91.3 91.6 91.9  PLT 290 298 310 234 619   Basic Metabolic Panel:  Recent Labs Lab 07/13/16 1334 07/14/16 0724 07/15/16 0453 07/16/16 0454 07/17/16 0513 07/18/16 0549  NA  --  134* 134* 134* 135 135  K  --  3.9 3.7 3.9 3.7 4.4  CL  --  103 102 101 101 102  CO2  --  20* 21* 21* 22 22  GLUCOSE  --  93 104* 103* 85 125*  BUN  --  65* 63* 58* 53* 50*  CREATININE  --  3.81* 3.26* 2.84* 2.47* 2.11*  CALCIUM  --  8.3* 8.6* 8.7* 8.5* 9.0  MG 1.7  --  1.5*  --   --   --   PHOS  --   --  5.0*  --   --   --    GFR: Estimated Creatinine Clearance: 18.1 mL/min (by C-G formula based on SCr of 2.11 mg/dL (H)). Liver Function Tests: No results for input(s): AST, ALT, ALKPHOS, BILITOT, PROT, ALBUMIN in the last 168 hours. No results for input(s): LIPASE, AMYLASE in the last 168 hours. No results for input(s): AMMONIA in the last 168 hours. Coagulation Profile: No results for input(s): INR, PROTIME in the last 168 hours. Cardiac Enzymes:  Recent Labs Lab 07/12/16 1900 07/13/16 0144 07/13/16 0747  TROPONINI <0.03 <0.03 <0.03   BNP (last 3 results) No results for input(s): PROBNP in the last 8760 hours. HbA1C: No results for input(s): HGBA1C in the last 72 hours. CBG: No results for input(s): GLUCAP in the last 168 hours. Lipid Profile: No results for input(s): CHOL, HDL, LDLCALC, TRIG, CHOLHDL, LDLDIRECT in the last 72 hours. Thyroid Function Tests: No results for input(s): TSH, T4TOTAL, FREET4, T3FREE, THYROIDAB in the last 72 hours. Anemia Panel: No results for input(s): VITAMINB12, FOLATE, FERRITIN, TIBC, IRON, RETICCTPCT in the last 72 hours. Sepsis Labs: No results for input(s): PROCALCITON, LATICACIDVEN in the last 168 hours.  Recent Results (from the past 240 hour(s))   Urine culture     Status: None   Collection Time: 07/11/16 11:05 AM  Result Value Ref Range Status   Specimen Description URINE, RANDOM  Final   Special Requests NONE  Final   Culture NO GROWTH  Final   Report Status 07/12/2016 FINAL  Final      Radiology Studies: No results found.  Scheduled Meds: . amLODipine  5 mg Oral BID WC  . apixaban  2.5 mg Oral BID  . feeding supplement (NEPRO CARB STEADY)  237 mL Oral BID BM  . hydrALAZINE  75 mg Oral TID  . magnesium gluconate  500 mg Oral Once per day on Sun Tue Thu Sat  . [START ON 07/19/2016] methylPREDNISolone  4 mg Oral 4X daily taper  . methylPREDNISolone  8 mg Oral Nightly  . pantoprazole  40 mg Oral Q0600  . pravastatin  40 mg Oral QPM   Continuous Infusions:    LOS: 7 days    Dagmawi Venable, MD Pager: Text Page via [email or web address]  [phone]  If 7PM-7AM, please contact night-coverage [email or web address] Password [facility] 07/18/2016, 6:54 PM

## 2016-07-18 NOTE — Consult Note (Signed)
ORTHOPAEDIC CONSULTATION  REQUESTING PHYSICIAN: Hosie Poisson, MD  Chief Complaint: Back pain  Assessment: Principal Problem:   Acute renal failure superimposed on chronic kidney disease ([place]) Active Problems:   PAF (paroxysmal atrial fibrillation) (HCC)   Essential hypertension   Single kidney   Flank pain   Hypoalbuminemia  Acute on chronic thoracic back pain.  It appears that she has had some acute paraspinal spasm / pain.  She does have some chronic osteoarthritic changes, but no acute osseous abnormality. This will likely continue to resolve with conservative measures. She has had some improvement so far although mobilization has been difficult for her.  No clinical or radiographic evidence to suggest emergent/operative orthopedic problem.  Plan: Continue Robaxin and Medrol taper. Mobilize  Continue medical management  Follow up with Dr. Alain Marion as needed on the office.  We discussed the possibility of continued therapy outpatient, additional images, and/or consultation with a spine specialist should her problem persist or worsen. Please call with questions.   HPI: Valerie Terrell is a 83 y.o. female who complains of primarily thoracic paraspinal back pain with spasm in the setting of acute on chronic kidney disease.  Back pain started a few days ago, has been sharp, and not relieved by hydrocodone.  Much worse with movement.  X-rays did show some chronic changes, but did not show acute osseous abnormality.  MRI showed L3-4: some ligamentum flavum thickening and mild facet degenerative change. Very small left foraminal protrusion is seen.  The central canal and foramina are open. L4-5: Mild facet degenerative change and ligamentum flavumthickening. The central canal and foramina are open. L5-S1: Shallow central protrusion without central canal or foraminal stenosis.   Since admission this is improved somewhat with Robaxin and Medrol taper. She denies disruption of  bowel or bladder function or sagittal anesthesia. No lower extremity weakness or numbness.  She reports having some back injections for sciatica previously which helped.  Past Medical History:  Diagnosis Date  . Atrophy of left kidney    with absent blood flow by renal artery dopplers (Dr. Gwenlyn Found)  . 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 Chronic renal insufficiency, stage 2 (mild)    GFR @ 60.  01/2016 renal u/s showed atrophic/hypoplastic left kidney.  No hydronephrosis.  Small right renal cyst.  . History of adenomatous polyp of colon   . History of subarachnoid hemorrhage 10/2014   after syncope and while on xarelto  . Hyperlipidemia   . Hypertension    Difficult to control, in the setting of one functioning kidney: pt was referred to nephrology by Dr. Gwenlyn Found 06/2015.  . Lumbar radiculopathy 2012  . Metatarsal fracture 06/10/2016   Nondisplaced, left 5th metatarsal--pt was referred to ortho  . Osteopenia 2014   T-score -2.1  . PAF (paroxysmal atrial fibrillation) (HCC)    Eliquis started after BRAO and CVA  . Stroke [facility])    cardioembolic (had CVA while on no anticoag)--"scattered subacute punctate infarcts: 1 in R parietal lobe and 2 in occipital cortex" on MRI br.  CT angio head/neck: aortic arch athero   Past Surgical History:  Procedure Laterality Date  . APPENDECTOMY  child  . CARDIOVASCULAR STRESS TEST  01/31/2016   Stress myoview: NORMAL/Low risk.  EF 56%.  . Zeb  . COLONOSCOPY  2015   + hx of adenomatous polyps.  Need digest health spec in [place] records to see when pt due for next colonoscopy  . Renal  artery dopplers  02/26/2016   Her right renal dimension was 11 cm pole to pole with mild to moderate right renal artery stenosis. A right renal aortic ratio was 3.22 suggesting less than a 50% stenosis.  . TONSILLECTOMY    . TRANSTHORACIC ECHOCARDIOGRAM  01/29/2016   EF 55-60%, normal LV wall motion, grade I DD.  No cardiac source of emboli  was seen.   Social History   Social History  . Marital status: Widowed    Spouse name: N/A  . Number of children: N/A  . Years of education: N/A   Social History Main Topics  . Smoking status: Never Smoker  . Smokeless tobacco: Never Used  . Alcohol use 0.6 oz/week    1 Glasses of wine per week     Comment: wine  . Drug use: No  . Sexual activity: No   Other Topics Concern  . None   Social History Narrative   Widow.  One daughter, lives with her.   Educ: college   Occup: retired Marine scientist.   No T/A/Ds.   She is almost a vegetarian.   Family History  Problem Relation Age of Onset  . Heart disease Father   . Early death Father   . Sudden Cardiac Death Neg Hx    Allergies  Allergen Reactions  . Clonidine Derivatives Palpitations and Other (See Comments)    Very sedated  . Codeine Nausea Only  . Nickel Rash  . Sulfa Antibiotics Other (See Comments)    Reaction unknown   Prior to Admission medications   Medication Sig Start Date End Date Taking? Authorizing Provider  acetaminophen (TYLENOL) 500 MG tablet Take 500 mg by mouth every 6 (six) hours as needed for moderate pain.   Yes Historical Provider, MD  amLODipine (NORVASC) 5 MG tablet Take 1 tablet (5 mg total) by mouth 2 (two) times daily with breakfast and lunch. 05/25/16  Yes Lorretta Harp, MD  apixaban (ELIQUIS) 5 MG TABS tablet Take 1 tablet (5 mg total) by mouth 2 (two) times daily. 02/12/16  Yes Luke K Kilroy, PA-C  atenolol-chlorthalidone (TENORETIC) 100-25 MG tablet Take 1 tablet by mouth every morning. 01/27/16  Yes Historical Provider, MD  Calcium Carb-Cholecalciferol (CALCIUM/VITAMIN D PO) Take 1 tablet by mouth 4 (four) times a week.   Yes Historical Provider, MD  hydrALAZINE (APRESOLINE) 50 MG tablet Take 1 tablet (50 mg total) by mouth 3 (three) times daily. Patient taking differently: Take 75 mg by mouth 3 (three) times daily.  05/01/16 07/30/16 Yes Lorretta Harp, MD  HYDROcodone-acetaminophen (NORCO)  5-325 MG tablet Take 1 tablet by mouth every 6 (six) hours as needed for moderate pain. 06/10/16  Yes Miguel Joellen Jersey, MD  losartan (COZAAR) 50 MG tablet Take 1 tablet (50 mg total) by mouth daily. 06/24/16 09/22/16 Yes Lorretta Harp, MD  Magnesium Gluconate (MAGNESIUM 27) 500 (27 Mg) MG TABS Take 500 mg by mouth 4 (four) times a week.    Yes Historical Provider, MD  Multiple Vitamin (MULTIVITAMIN) tablet Take 1 tablet by mouth 3 (three) times a week.    Yes Historical Provider, MD  ondansetron (ZOFRAN) 4 MG tablet Take 1 tablet (4 mg total) by mouth every 8 (eight) hours as needed for nausea or vomiting. Patient taking differently: Take 2-4 mg by mouth every 8 (eight) hours as needed for nausea or vomiting.  07/08/16  Yes Renee A Kuneff, DO  pravastatin (PRAVACHOL) 40 MG tablet Take 40 mg by mouth every evening.  07/16/15  Yes Historical Provider, MD  vitamin C (ASCORBIC ACID) 500 MG tablet Take 500 mg by mouth daily.   Yes Historical Provider, MD   No results found.  Positive ROS: All other systems have been reviewed and were otherwise negative with the exception of those mentioned in the HPI and as above.  Objective: Labs cbc  Recent Labs  07/17/16 0513 07/18/16 0549  WBC 5.9 3.6*  HGB 10.4* 10.8*  HCT 31.8* 33.9*  PLT 234 252     Recent Labs  07/17/16 0513 07/18/16 0549  NA 135 135  K 3.7 4.4  CL 101 102  CO2 22 22  GLUCOSE 85 125*  BUN 53* 50*  CREATININE 2.47* 2.11*  CALCIUM 8.5* 9.0    Physical Exam: Vitals:   07/18/16 0436 07/18/16 0900  BP: (!) 160/64 (!) 162/60  Pulse: 81 79  Resp: 17 18  Temp: 98.5 F (36.9 C) 98.1 F (36.7 C)   General: Alert, no acute distress. Daughter at bedside. Mental status: Alert and Oriented x3 Neurologic: Speech Clear and organized, no gross focal findings or movement disorder appreciated. Respiratory: No cyanosis, no use of accessory musculature Cardiovascular: Pedal edema symmetric  GI: Abdomen is soft and non-tender,  non-distended. Skin: Warm and dry.  No lesions in the area of chief complaint  Extremities: Warm and dry Psychiatric: Patient is competent for consent with normal mood and affect  MUSCULOSKELETAL:  Sits up from supine to sitting upright at edge of bed without assistance-slowly, but without apparent significant discomfort. No lesions on her back. No ecchymosis. Nontender in areas of either paraspinal musculature or spinous processes, lumbar or thoracic. No palpable spasm. Negative straight leg raise bilaterally. Other extremities are atraumatic with painless ROM and NVI.   Prudencio Burly III PA-C 07/18/2016 12:11 PM

## 2016-07-19 LAB — BASIC METABOLIC PANEL
ANION GAP: 7 (ref 5–15)
BUN: 50 mg/dL — ABNORMAL HIGH (ref 6–20)
CALCIUM: 9.3 mg/dL (ref 8.9–10.3)
CO2: 24 mmol/L (ref 22–32)
Chloride: 103 mmol/L (ref 101–111)
Creatinine, Ser: 2 mg/dL — ABNORMAL HIGH (ref 0.44–1.00)
GFR, EST AFRICAN AMERICAN: 26 mL/min — AB (ref >60)
GFR, EST NON AFRICAN AMERICAN: 22 mL/min — AB (ref >60)
Glucose, Bld: 117 mg/dL — ABNORMAL HIGH (ref 65–99)
Potassium: 5.3 mmol/L — ABNORMAL HIGH (ref 3.5–5.1)
SODIUM: 134 mmol/L — AB (ref 135–145)

## 2016-07-19 MED ORDER — FUROSEMIDE 20 MG PO TABS
20.0000 mg | ORAL_TABLET | Freq: Every day | ORAL | Status: DC
  Administered 2016-07-19 – 2016-07-20 (×2): 20 mg via ORAL
  Filled 2016-07-19 (×2): qty 1

## 2016-07-19 MED ORDER — SIMETHICONE 40 MG/0.6ML PO SUSP
80.0000 mg | Freq: Four times a day (QID) | ORAL | Status: DC | PRN
  Administered 2016-07-19 – 2016-07-20 (×2): 80 mg via ORAL
  Filled 2016-07-19 (×5): qty 1.2

## 2016-07-19 MED ORDER — TRAZODONE HCL 50 MG PO TABS
50.0000 mg | ORAL_TABLET | Freq: Every day | ORAL | Status: DC
  Administered 2016-07-19: 50 mg via ORAL
  Filled 2016-07-19: qty 1

## 2016-07-19 MED ORDER — SODIUM POLYSTYRENE SULFONATE 15 GM/60ML PO SUSP
30.0000 g | Freq: Once | ORAL | Status: AC
  Administered 2016-07-19: 30 g via ORAL
  Filled 2016-07-19: qty 120

## 2016-07-19 MED ORDER — BISACODYL 10 MG RE SUPP
10.0000 mg | Freq: Every day | RECTAL | Status: DC | PRN
  Administered 2016-07-19: 10 mg via RECTAL
  Filled 2016-07-19: qty 1

## 2016-07-19 NOTE — Progress Notes (Signed)
PROGRESS NOTE [facility]   Valerie Terrell   RSW:[id number] DOB: [birth year]  DOA: 07/11/2016 PCP: Valerie Sou, MD   Brief Narrative:  83-year-old female with medical history significant of PAF, solitary kidney, hypertension, hyperlipidemia, CVA presented back pain that started today prior to admission. She also report that had some episodes of diarrhea and generalized weakness a week prior to admission. In ED evaluation revealed elevated BUN and creatinine, she is admitted for acute on chronic renal failure. cxr-ray show pleural effusions with increasing vascular markings and cardiomegaly.  Subjective: Slightly worsening of back pain, and requesting med to sleep.   Assessment & Plan: Acute on CKD3 - Baseline Cr 1.4 - on chart review patient seem to be fluid overload rather than dehydration on initial CXR she had new pleural effusion with increase in vascular markings and cardiomegaly. Patient with 2+ LE pitting edema. Improving.creatinine also continues to improve with diuresis . Creatinine is 2.0 today,.   Nephrology recommendations appreciated.  Avoid nephrotoxins Renal US no hydronephrosis  ECHO done show diastolic dysfunction and small pericardial effusion without tamponade.  PAF: chadsvascscore 5 Rate controlled Back on eliquis.   Back pain - likely musculoskeletal. Improving with robaxin. LS x ray show Lumbar disc degeneration greatest at L5-S1. No evidence acute osseous abnormality. Increase pain meds.  MRI spine without contrast shows L3-4: There is some ligamentum flavum thickening and mild facet degenerative change. Very small left foraminal protrusion is seen.The central canal and foramina are open. L4-5: Mild facet degenerative change and ligamentum flavum thickening. The central canal and foramina are open.  L5-S1: Shallow central protrusion without central canal or foraminal Stenosis.  Added IV morphine, in addition to robaxin and vicodin .  Discussed  the results with the patient, .  Orthopedics consulted and medrol dose pack ordered. Outpatient follow up with Dr Percell Miller as recommended.  Pt reports afte PT yesterday, her back pain is slightly worse today.  Requesting some thing for sleep.    HTN BP stable.   Hyperkalemia: po lasix 20 mg ordered as she reports 2+ edemaof the legs and kayexalate.  Repeat K in am.       DVT prophylaxis: heparin.   Code Status: FULL  Family Communication: daughter at bedside.  Disposition Plan: Anticipate discharge back to home when Cr improve.  And back pain improves.   Consultants:   Nephrology - Dr. Justin Mend   Procedures:   Renal US   ECHO 2/27 Impressions:  - Compared to the prior study in 2017, the LVEF is higher at   60-65%. There is moderate LVH. The left atrium is severely   dilated. A small posterior pericardial effusion is noted without   tamponade features. There is diastolic dysfunction with elevated   left and right heart pressures.  Antimicrobials:  None     Objective: Vitals:   07/18/16 0900 07/18/16 1707 07/19/16 0507 07/19/16 0909  BP: (!) 162/60 (!) 149/53 (!) 171/68 (!) 159/61  Pulse: 79 74 84 87  Resp: 18 18 19 20   Temp: 98.1 F (36.7 C) 98 F (36.7 C) 98.6 F (37 C) 98.8 F (37.1 C)  TempSrc: Oral Oral Oral Oral  SpO2: 98% 95% 94% 95%  Weight:      Height:        Intake/Output Summary (Last 24 hours) at 07/19/16 1555 Last data filed at 07/19/16 1548  Gross per 24 hour  Intake              960 ml  Output  900 ml  Net               60 ml   Filed Weights   07/15/16 2136 07/16/16 2147 07/17/16 2043  Weight: 68.5 kg (151 lb 0.2 oz) 68 kg (150 lb) 68.5 kg (151 lb)    Examination:  General exam: NAD on O2 [place]  Respiratory system: good air entry . No wheezing heard.   Cardiovascular system: S1S2 RRR +SM  Gastrointestinal system: Abdomen soft NDNT Central nervous system: Alert and oriented. Extremities: 1+ pitting pedal edema.  Skin: No  rashes    Data Reviewed: I have personally reviewed following labs and imaging studies  CBC:  Recent Labs Lab 07/13/16 0747 07/15/16 0453 07/16/16 0454 07/17/16 0513 07/18/16 0549  WBC 7.3 7.4 9.4 5.9 3.6*  NEUTROABS 5.4  --   --   --   --   HGB 11.8* 11.9* 12.3 10.4* 10.8*  HCT 36.9 36.7 37.8 31.8* 33.9*  MCV 92.9 91.5 91.3 91.6 91.9  PLT 290 298 310 234 671   Basic Metabolic Panel:  Recent Labs Lab 07/13/16 1334  07/15/16 0453 07/16/16 0454 07/17/16 0513 07/18/16 0549 07/19/16 0510  NA  --   < > 134* 134* 135 135 134*  K  --   < > 3.7 3.9 3.7 4.4 5.3*  CL  --   < > 102 101 101 102 103  CO2  --   < > 21* 21* 22 22 24   GLUCOSE  --   < > 104* 103* 85 125* 117*  BUN  --   < > 63* 58* 53* 50* 50*  CREATININE  --   < > 3.26* 2.84* 2.47* 2.11* 2.00*  CALCIUM  --   < > 8.6* 8.7* 8.5* 9.0 9.3  MG 1.7  --  1.5*  --   --   --   --   PHOS  --   --  5.0*  --   --   --   --   < > = values in this interval not displayed. GFR: Estimated Creatinine Clearance: 19.1 mL/min (by C-G formula based on SCr of 2 mg/dL (H)). Liver Function Tests: No results for input(s): AST, ALT, ALKPHOS, BILITOT, PROT, ALBUMIN in the last 168 hours. No results for input(s): LIPASE, AMYLASE in the last 168 hours. No results for input(s): AMMONIA in the last 168 hours. Coagulation Profile: No results for input(s): INR, PROTIME in the last 168 hours. Cardiac Enzymes:  Recent Labs Lab 07/12/16 1900 07/13/16 0144 07/13/16 0747  TROPONINI <0.03 <0.03 <0.03   BNP (last 3 results) No results for input(s): PROBNP in the last 8760 hours. HbA1C: No results for input(s): HGBA1C in the last 72 hours. CBG: No results for input(s): GLUCAP in the last 168 hours. Lipid Profile: No results for input(s): CHOL, HDL, LDLCALC, TRIG, CHOLHDL, LDLDIRECT in the last 72 hours. Thyroid Function Tests: No results for input(s): TSH, T4TOTAL, FREET4, T3FREE, THYROIDAB in the last 72 hours. Anemia Panel: No results  for input(s): VITAMINB12, FOLATE, FERRITIN, TIBC, IRON, RETICCTPCT in the last 72 hours. Sepsis Labs: No results for input(s): PROCALCITON, LATICACIDVEN in the last 168 hours.  Recent Results (from the past 240 hour(s))  Urine culture     Status: None   Collection Time: 07/11/16 11:05 AM  Result Value Ref Range Status   Specimen Description URINE, RANDOM  Final   Special Requests NONE  Final   Culture NO GROWTH  Final   Report Status 07/12/2016 FINAL  Final      Radiology Studies: No results found.  Scheduled Meds: . amLODipine  5 mg Oral BID WC  . apixaban  2.5 mg Oral BID  . feeding supplement (NEPRO CARB STEADY)  237 mL Oral BID BM  . furosemide  20 mg Oral Daily  . hydrALAZINE  75 mg Oral TID  . magnesium gluconate  500 mg Oral Once per day on Sun Tue Thu Sat  . methylPREDNISolone  4 mg Oral 4X daily taper  . pantoprazole  40 mg Oral Q0600  . pravastatin  40 mg Oral QPM  . traZODone  50 mg Oral QHS   Continuous Infusions:    LOS: 8 days    Khai Torbert, MD Pager: Text Page via [email or web address]  [phone]  If 7PM-7AM, please contact night-coverage [email or web address] Password [email or web address] 07/19/2016, 3:55 PM

## 2016-07-20 LAB — BASIC METABOLIC PANEL
ANION GAP: 6 (ref 5–15)
BUN: 50 mg/dL — ABNORMAL HIGH (ref 6–20)
CHLORIDE: 105 mmol/L (ref 101–111)
CO2: 23 mmol/L (ref 22–32)
CREATININE: 1.92 mg/dL — AB (ref 0.44–1.00)
Calcium: 8.7 mg/dL — ABNORMAL LOW (ref 8.9–10.3)
GFR calc non Af Amer: 23 mL/min — ABNORMAL LOW (ref >60)
GFR, EST AFRICAN AMERICAN: 27 mL/min — AB (ref >60)
Glucose, Bld: 98 mg/dL (ref 65–99)
Potassium: 4.5 mmol/L (ref 3.5–5.1)
SODIUM: 134 mmol/L — AB (ref 135–145)

## 2016-07-20 MED ORDER — CHLORTHALIDONE 25 MG PO TABS
25.0000 mg | ORAL_TABLET | Freq: Every day | ORAL | Status: DC
  Administered 2016-07-20: 25 mg via ORAL
  Filled 2016-07-20: qty 1

## 2016-07-20 MED ORDER — PANTOPRAZOLE SODIUM 40 MG PO TBEC: 40.0000 mg | DELAYED_RELEASE_TABLET | Freq: Every day | ORAL | 0 refills | Status: DC

## 2016-07-20 MED ORDER — HYDRALAZINE HCL 25 MG PO TABS: 75.0000 mg | ORAL_TABLET | Freq: Three times a day (TID) | ORAL | Status: DC

## 2016-07-20 MED ORDER — NEPRO/CARBSTEADY PO LIQD: 237.0000 mL | Freq: Two times a day (BID) | ORAL | 0 refills | Status: DC

## 2016-07-20 MED ORDER — ATENOLOL-CHLORTHALIDONE 100-25 MG PO TABS: 1.0000 | ORAL_TABLET | Freq: Every morning | ORAL | Status: DC

## 2016-07-20 MED ORDER — ATENOLOL 50 MG PO TABS
100.0000 mg | ORAL_TABLET | Freq: Every day | ORAL | Status: DC
  Administered 2016-07-20: 100 mg via ORAL
  Filled 2016-07-20: qty 2

## 2016-07-20 MED ORDER — HYDROCODONE-ACETAMINOPHEN 5-325 MG PO TABS: 1.0000 | ORAL_TABLET | Freq: Three times a day (TID) | ORAL | 0 refills | Status: DC | PRN

## 2016-07-20 MED ORDER — METHOCARBAMOL 750 MG PO TABS: 750.0000 mg | ORAL_TABLET | Freq: Three times a day (TID) | ORAL | 0 refills | Status: DC | PRN

## 2016-07-20 MED ORDER — TRAZODONE HCL 50 MG PO TABS: 50.0000 mg | ORAL_TABLET | Freq: Every day | ORAL | 0 refills | Status: DC

## 2016-07-20 MED ORDER — APIXABAN 2.5 MG PO TABS: 2.5000 mg | ORAL_TABLET | Freq: Two times a day (BID) | ORAL | 0 refills | Status: DC

## 2016-07-20 MED ORDER — METHYLPREDNISOLONE 4 MG PO TBPK: ORAL_TABLET | ORAL | 0 refills | Status: DC

## 2016-07-21 ENCOUNTER — Other Ambulatory Visit: Payer: Self-pay | Admitting: Family Medicine

## 2016-07-21 ENCOUNTER — Telehealth: Payer: Self-pay

## 2016-07-21 NOTE — Telephone Encounter (Signed)
Noted  

## 2016-07-21 NOTE — Discharge Summary (Signed)
Physician Discharge Summary  Valerie Terrell BDZ:[id number] DOB: 06/18/1932 DOA: 07/11/2016  PCP: Tammi Sou, MD  Admit date: 07/11/2016 Discharge date: 07/20/2016  Admitted From:Home.  Disposition:  Home.   Recommendations for Outpatient Follow-up:  1. Follow up with PCP in 1-2 weeks 2. Please obtain BMP/CBC in one week 3. Please follow up with orthopedics for your back pain.  4. Please follow up with nephrology as recommended.     Discharge Condition:stable.  CODE STATUS:full code.  Diet recommendation: Heart Healthy   Brief/Interim Summary: 83 year-old female with medical history significant of PAF, solitary kidney, hypertension, hyperlipidemia, CVA presented back pain that started today prior to admission. She also report that had some episodes of diarrhea and generalized weakness a week prior to admission. In ED evaluation revealed elevated BUN and creatinine, she is admitted for acute on chronic renal failure. cxr-ray show pleural effusions with increasing vascular markings and cardiomegaly.  Discharge Diagnoses:  Principal Problem:   Acute renal failure superimposed on chronic kidney disease ([place]) Active Problems:   PAF (paroxysmal atrial fibrillation) (H[facility])   Essential hypertension   Single kidney   Flank pain   Hypoalbuminemia  Acute on CKD3 - Baseline Cr 1.4 - on chart review patient seem to be fluid overload rather than dehydration on initial CXR she had new pleural effusion with increase in vascular markings and cardiomegaly. Patient with 2+ LE pitting edema. Improving.creatinine also continues to improve with diuresis . Creatinine is 1.92  today,.   Nephrology recommendations appreciated.  Avoid nephrotoxins Renal US no hydronephrosis  ECHO done show diastolic dysfunction and small pericardial effusion without tamponade.  PAF: chadsvascscore 5 Rate controlled Back on eliquis.   Back pain - likely musculoskeletal. Improving with robaxin. LS x ray show  Lumbar disc degeneration greatest at L5-S1. No evidence acute osseous abnormality. Increase pain meds.  MRI spine without contrast shows L3-4: There is some ligamentum flavum thickening and mild facet degenerative change. Very small left foraminal protrusion is seen.The central canal and foramina are open. L4-5: Mild facet degenerative change and ligamentum flavum thickening. The central canal and foramina are open.  L5-S1: Shallow central protrusion without central canal or foraminal Stenosis.  Added IV morphine, in addition to robaxin and vicodin .  Discussed the results with the patient, .  Orthopedics consulted and medrol dose pack ordered. Outpatient follow up with Dr Percell Miller as recommended.     HTN BP stable.   Hyperkalemia: po lasix 20 mg ordered as she reports 2+ edemaof the legs and kayexalate.  Repeat K in am shows normal.    Discharge Instructions  Discharge Instructions    Diet - low sodium heart healthy    Complete by:  As directed    Discharge instructions    Complete by:  As directed    Please follow up with PCP in one week.     Allergies as of 07/20/2016      Reactions   Clonidine Derivatives Palpitations, Other (See Comments)   Very sedated   Codeine Nausea Only   Nickel Rash   Sulfa Antibiotics Other (See Comments)   Reaction unknown      Medication List    STOP taking these medications   losartan 50 MG tablet Commonly known as:  COZAAR     TAKE these medications   acetaminophen 500 MG tablet Commonly known as:  TYLENOL Take 500 mg by mouth every 6 (six) hours as needed for moderate pain.   amLODipine 5 MG tablet Commonly known as:  NORVASC Take 1 tablet (5 mg total) by mouth 2 (two) times daily with breakfast and lunch.   apixaban 2.5 MG Tabs tablet Commonly known as:  ELIQUIS Take 1 tablet (2.5 mg total) by mouth 2 (two) times daily. What changed:  medication strength  how much to take   atenolol-chlorthalidone 100-25 MG  tablet Commonly known as:  TENORETIC Take 1 tablet by mouth every morning.   CALCIUM/VITAMIN D PO Take 1 tablet by mouth 4 (four) times a week.   feeding supplement (NEPRO CARB STEADY) Liqd Take 237 mLs by mouth 2 (two) times daily between meals.   hydrALAZINE 25 MG tablet Commonly known as:  APRESOLINE Take 3 tablets (75 mg total) by mouth 3 (three) times daily. What changed:  medication strength  how much to take   HYDROcodone-acetaminophen 5-325 MG tablet Commonly known as:  NORCO Take 1 tablet by mouth every 8 (eight) hours as needed for moderate pain. What changed:  when to take this   Magnesium 27 500 (27 Mg) MG Tabs Take 500 mg by mouth 4 (four) times a week.   methocarbamol 750 MG tablet Commonly known as:  ROBAXIN Take 1 tablet (750 mg total) by mouth every 8 (eight) hours as needed for muscle spasms.   methylPREDNISolone 4 MG Tbpk tablet Commonly known as:  MEDROL DOSEPAK Methylprednisolone 4 mg 1 tab tonight, followed by  Methylprednisolone 4 mg  2 tab tomorrow followed by Methylprednisolone 4 mg 1 TAB on Wednesday and stop.   multivitamin tablet Take 1 tablet by mouth 3 (three) times a week.   ondansetron 4 MG tablet Commonly known as:  ZOFRAN Take 1 tablet (4 mg total) by mouth every 8 (eight) hours as needed for nausea or vomiting. What changed:  how much to take   pantoprazole 40 MG tablet Commonly known as:  PROTONIX Take 1 tablet (40 mg total) by mouth daily at 6 (six) AM.   pravastatin 40 MG tablet Commonly known as:  PRAVACHOL Take 40 mg by mouth every evening.   traZODone 50 MG tablet Commonly known as:  DESYREL Take 1 tablet (50 mg total) by mouth at bedtime.   vitamin C 500 MG tablet Commonly known as:  ASCORBIC ACID Take 500 mg by mouth daily.      Follow-up Information    MURPHY, TIMOTHY D, MD Follow up.   Specialty:  Orthopedic Surgery Contact information: [address] [place] [address] [phone]         Tammi Sou, MD. Schedule an appointment as soon as possible for a visit in 1 week.   Specialty:  Family Medicine Why:  APPOINTMENT: Monday, 08-03-16 at 8:15am, this was the next available appointment Contact information: [address] [place] [address] [phone]          Allergies  Allergen Reactions  . Clonidine Derivatives Palpitations and Other (See Comments)    Very sedated  . Codeine Nausea Only  . Nickel Rash  . Sulfa Antibiotics Other (See Comments)    Reaction unknown    Consultations:  Nephrology  Orthopedics.    Procedures/Studies: Ct Abdomen Pelvis Wo Contrast  Result Date: 07/11/2016 CLINICAL DATA:  Back pain, right CVA pain, history of stones. EXAM: CT ABDOMEN AND PELVIS WITHOUT CONTRAST TECHNIQUE: Multidetector CT imaging of the abdomen and pelvis was performed following the standard protocol without IV contrast. COMPARISON:  None. FINDINGS: Lower chest: Small left pleural effusion and compressive atelectasis in the left lower lobe.  Heart is enlarged. Coronary artery calcification. Small amount of pericardial fluid may be physiologic. Distal esophagus is grossly unremarkable. Hepatobiliary: Faint 11 mm low-attenuation lesion in the dome of the liver is too small to characterize. Liver is otherwise unremarkable. There may be sludge in the gallbladder. No biliary ductal dilatation. Pancreas: Negative. Spleen: Negative. Adrenals/Urinary Tract: Adrenal glands are unremarkable. 1.5 cm low-attenuation lesion in the right kidney is difficult to further characterize without IV contrast. There may be a punctate stone in a markedly atrophic left kidney. Ureters are decompressed. Bladder is low in volume. Stomach/Bowel: Stomach, small bowel and colon are unremarkable. Appendix is not readily visualized. Vascular/Lymphatic: Atherosclerotic calcification of the arterial vasculature without abdominal aortic aneurysm. Porta hepatis, retroperitoneal and  mesenteric lymph nodes measure up to 10 mm. Reproductive: No adnexal mass. Other: Small periumbilical and bilateral inguinal hernias contain fat. Musculoskeletal: No worrisome lytic or sclerotic lesions. IMPRESSION: 1. No findings to explain the patient's symptoms. 2. Tiny left pleural effusion with compressive atelectasis left lower lobe. 3. Borderline porta hepatis, retroperitoneal and mesenteric lymph nodes, nonspecific. 4.  Aortic atherosclerosis (ICD10-170.0). 5. Possible gallbladder sludge. 6. Possible punctate stone in an atrophic left kidney. Electronically Signed   By: Lorin Picket M.D.   On: 07/11/2016 12:02   Dg Chest 2 View  Result Date: 07/11/2016 CLINICAL DATA:  83 year old female with a 2 day history of pleuritic back pain, weakness and shortness of breath EXAM: CHEST  2 VIEW COMPARISON:  Prior chest x-ray 02/27/2016 FINDINGS: Stable cardiomegaly. Atherosclerotic calcifications are present in the transverse aorta. Mild pulmonary vascular congestion without overt edema. Interval development of a small layering left pleural effusion and associated left basilar opacity which is favored to reflect atelectasis. Atherosclerotic calcifications are present in the transverse aorta. No acute osseous abnormality. IMPRESSION: 1. New development of a small left layering pleural effusion with associated left basilar opacity likely reflecting atelectasis. 2. Stable cardiomegaly. 3. Mild vascular congestion without overt edema. 4.  Aortic Atherosclerosis (ICD10-170.0). Electronically Signed   By: Jacqulynn Cadet M.D.   On: 07/11/2016 09:33   Dg Lumbar Spine Complete  Result Date: 07/15/2016 CLINICAL DATA:  Right lower back pain for 6 days. EXAM: LUMBAR SPINE - COMPLETE 4+ VIEW COMPARISON:  CT abdomen and pelvis 07/11/2016 FINDINGS: There are 5 non rib bearing lumbar type vertebrae. A rudimentary disc is present at S1-2. Vertebral alignment is normal. Vertebral body heights are preserved without evidence  of fracture. Moderate disc space narrowing is present at L5-S1 with degenerative endplate spurring and evidence of vacuum disc phenomenon. Mild endplate spurring is present more proximally in the lumbar spine without significant disc space height loss. No destructive osseous lesion is identified. Extensive abdominal aortic calcified atherosclerosis is noted. IMPRESSION: 1. Lumbar disc degeneration greatest at L5-S1. No evidence acute osseous abnormality. 2. Aortic atherosclerosis. Electronically Signed   By: Logan Bores M.D.   On: 07/15/2016 16:00   Mr Lumbar Spine Wo Contrast  Result Date: 07/16/2016 CLINICAL DATA:  Low back pain over the past couple of weeks which has worsened in the past 2 days. No known injury. EXAM: MRI LUMBAR SPINE WITHOUT CONTRAST TECHNIQUE: Multiplanar, multisequence MR imaging of the lumbar spine was performed. No intravenous contrast was administered. COMPARISON:  Plain films lumbar spine 07/15/2016. CT abdomen and pelvis 07/11/2016. FINDINGS: Segmentation: Standard. Rudimentary disc material at S1-2 incidentally noted. Alignment:  Maintained. Vertebrae:  No fracture.  Marrow signal is somewhat heterogeneous. Conus medullaris: Extends to the L3-4 Level and appears normal. Paraspinal and  other soft tissues: T2 hyperintense lesion right kidney noted. The left kidney is markedly atrophic. Disc levels: T11-12 and T12-L1 are imaged in the sagittal plane only and negative. L1-2:  Negative. L2-3:  Negative. L3-4: There is some ligamentum flavum thickening and mild facet degenerative change. Very small left foraminal protrusion is seen. The central canal and foramina are open. L4-5: Mild facet degenerative change and ligamentum flavum thickening. The central canal and foramina are open. L5-S1: Shallow central protrusion without central canal or foraminal stenosis. IMPRESSION: Negative for fracture or other acute abnormality. Mild degenerative disease as described above. Electronically Signed    By: Inge Rise M.D.   On: 07/16/2016 12:44   US Renal  Result Date: 07/11/2016 CLINICAL DATA:  Right flank pain history of chronic renal insufficiency EXAM: RENAL / URINARY TRACT ULTRASOUND COMPLETE COMPARISON:  CT 07/11/2016, renal ultrasound 01/29/2016 FINDINGS: Right Kidney: Length: 11.2 cm. Echogenicity within normal limits. No hydronephrosis. 1.9 x 1.5 x 1.7 cm cyst mid right kidney. Left Kidney: Length: Not clearly visualized. Bladder: Poorly visualized, and is likely empty. Incidental note made of fatty liver. IMPRESSION: 1. 1.9 cm cyst in the right kidney.  No right hydronephrosis 2. Left kidney is not clearly visualized, this may be secondary to marked atrophy. 3. Bladder not well visualized and is likely empty. Electronically Signed   By: Donavan Foil M.D.   On: 07/11/2016 18:21   Dg Chest Port 1 View  Result Date: 07/12/2016 CLINICAL DATA:  Fluid retention and legs. EXAM: PORTABLE CHEST 1 VIEW COMPARISON:  07/11/2016 FINDINGS: There is moderate cardiac enlargement. Aortic atherosclerosis noted. Small to moderate left pleural effusion is again identified. No airspace opacities. IMPRESSION: Similar appearance of cardiac enlargement, aortic atherosclerosis and left pleural effusion. Electronically Signed   By: Kerby Moors M.D.   On: 07/12/2016 18:43       Subjective: No new complaints, back pain improved.   Discharge Exam: Vitals:   07/20/16 0354 07/20/16 1252  BP: (!) 188/75 (!) 162/62  Pulse: 95   Resp: 20   Temp: 98.1 F (36.7 C) 98.6 F (37 C)   Vitals:   07/19/16 1700 07/19/16 2232 07/20/16 0354 07/20/16 1252  BP: (!) 161/66 (!) 154/63 (!) 188/75 (!) 162/62  Pulse: 88 74 95   Resp: 18 19 20    Temp: 98.4 F (36.9 C) 98.6 F (37 C) 98.1 F (36.7 C) 98.6 F (37 C)  TempSrc: Oral Oral Oral Oral  SpO2: 96% 98% 97%   Weight:  68.2 kg (150 lb 5.7 oz)    Height:        General: Pt is alert, awake, not in acute distress Cardiovascular: RRR, S1/S2 +, no rubs,  no gallops Respiratory: CTA bilaterally, no wheezing, no rhonchi Abdominal: Soft, NT, ND, bowel sounds + Extremities:2 + edema.     The results of significant diagnostics from this hospitalization (including imaging, microbiology, ancillary and laboratory) are listed below for reference.     Microbiology: Recent Results (from the past 240 hour(s))  Urine culture     Status: None   Collection Time: 07/11/16 11:05 AM  Result Value Ref Range Status   Specimen Description URINE, RANDOM  Final   Special Requests NONE  Final   Culture NO GROWTH  Final   Report Status 07/12/2016 FINAL  Final     Labs: BNP (last 3 results)  Recent Labs  07/12/16 1707 07/13/16 0144  BNP 857.9* 951.8*   Basic Metabolic Panel:  Recent Labs Lab 07/15/16 0453  07/16/16 0454 07/17/16 0513 07/18/16 0549 07/19/16 0510 07/20/16 0530  NA 134* 134* 135 135 134* 134*  K 3.7 3.9 3.7 4.4 5.3* 4.5  CL 102 101 101 102 103 105  CO2 21* 21* 22 22 24 23   GLUCOSE 104* 103* 85 125* 117* 98  BUN 63* 58* 53* 50* 50* 50*  CREATININE 3.26* 2.84* 2.47* 2.11* 2.00* 1.92*  CALCIUM 8.6* 8.7* 8.5* 9.0 9.3 8.7*  MG 1.5*  --   --   --   --   --   PHOS 5.0*  --   --   --   --   --    Liver Function Tests: No results for input(s): AST, ALT, ALKPHOS, BILITOT, PROT, ALBUMIN in the last 168 hours. No results for input(s): LIPASE, AMYLASE in the last 168 hours. No results for input(s): AMMONIA in the last 168 hours. CBC:  Recent Labs Lab 07/15/16 0453 07/16/16 0454 07/17/16 0513 07/18/16 0549  WBC 7.4 9.4 5.9 3.6*  HGB 11.9* 12.3 10.4* 10.8*  HCT 36.7 37.8 31.8* 33.9*  MCV 91.5 91.3 91.6 91.9  PLT 298 310 234 252   Cardiac Enzymes: No results for input(s): CKTOTAL, CKMB, CKMBINDEX, TROPONINI in the last 168 hours. BNP: Invalid input(s): POCBNP CBG: No results for input(s): GLUCAP in the last 168 hours. D-Dimer No results for input(s): DDIMER in the last 72 hours. Hgb A1c No results for input(s): HGBA1C  in the last 72 hours. Lipid Profile No results for input(s): CHOL, HDL, LDLCALC, TRIG, CHOLHDL, LDLDIRECT in the last 72 hours. Thyroid function studies No results for input(s): TSH, T4TOTAL, T3FREE, THYROIDAB in the last 72 hours.  Invalid input(s): FREET3 Anemia work up No results for input(s): VITAMINB12, FOLATE, FERRITIN, TIBC, IRON, RETICCTPCT in the last 72 hours. Urinalysis    Component Value Date/Time   COLORURINE YELLOW 07/11/2016 1105   APPEARANCEUR CLEAR 07/11/2016 1105   LABSPEC 1.015 07/11/2016 1105   PHURINE 5.0 07/11/2016 1105   GLUCOSEU NEGATIVE 07/11/2016 1105   HGBUR NEGATIVE 07/11/2016 1105   [place] 07/11/2016 1105   KETONESUR NEGATIVE 07/11/2016 1105   PROTEINUR NEGATIVE 07/11/2016 1105   NITRITE NEGATIVE 07/11/2016 1105   LEUKOCYTESUR SMALL (A) 07/11/2016 1105   Sepsis Labs Invalid input(s): PROCALCITONIN,  WBC,  LACTICIDVEN Microbiology Recent Results (from the past 240 hour(s))  Urine culture     Status: None   Collection Time: 07/11/16 11:05 AM  Result Value Ref Range Status   Specimen Description URINE, RANDOM  Final   Special Requests NONE  Final   Culture NO GROWTH  Final   Report Status 07/12/2016 FINAL  Final     Time coordinating discharge: Over 30 minutes  SIGNED:   Hosie Poisson, MD  [facility] 07/21/2016, 8:40 AM Pager   If 7PM-7AM, please contact night-coverage [email or web address] Password [email or web address]

## 2016-07-21 NOTE — Telephone Encounter (Signed)
Transition Care Management Follow-up Telephone Call   Date discharged? 07/20/2016   How have you been since you were released from the hospital? "better I think, but still in pain and my legs are still swollen"   Do you understand why you were in the hospital? yes, "back pain, high potassium"   Do you understand the discharge instructions? yes   Where were you discharged to? Home. Daughter lives with patient.    Items Reviewed:  Medications reviewed: no. Patient did not want to review at time of call.   Allergies reviewed: yes  Dietary changes reviewed: yes, low sodium diet discussed.   Referrals reviewed: no   Functional Questionnaire:   Activities of Daily Living (ADLs):   She states they are independent in the following: ambulation, bathing and hygiene, feeding, continence, grooming, toileting and dressing States they require assistance with the following: Patient states daughter is a big help, but able to perform ADLs.    Any transportation issues/concerns?: no   Any patient concerns? No.    Confirmed importance and date/time of follow-up visits scheduled yes  Provider Appointment booked with PCP on Monday, 07/27/2016 @ 3:30.   Confirmed with patient if condition begins to worsen call PCP or go to the ER.  Patient was given the office number and encouraged to call back with question or concerns.  : yes

## 2016-07-22 ENCOUNTER — Encounter (HOSPITAL_COMMUNITY): Payer: Self-pay | Admitting: *Deleted

## 2016-07-22 ENCOUNTER — Telehealth: Payer: Self-pay | Admitting: Family Medicine

## 2016-07-22 ENCOUNTER — Emergency Department (HOSPITAL_COMMUNITY): Payer: Medicare Other

## 2016-07-22 ENCOUNTER — Encounter: Payer: Self-pay | Admitting: Physician Assistant

## 2016-07-22 ENCOUNTER — Inpatient Hospital Stay (HOSPITAL_COMMUNITY)
Admission: EM | Admit: 2016-07-22 | Discharge: 2016-07-25 | DRG: 516 | Disposition: A | Discharge status: Home Health | Payer: Medicare Other | Attending: Family Medicine | Admitting: Family Medicine

## 2016-07-22 ENCOUNTER — Ambulatory Visit (INDEPENDENT_AMBULATORY_CARE_PROVIDER_SITE_OTHER): Payer: Medicare Other | Admitting: Physician Assistant

## 2016-07-22 VITALS — BP 148/72 | HR 64 | Temp 97.7°F | Resp 16 | Ht 60.0 in

## 2016-07-22 DIAGNOSIS — R079 Chest pain, unspecified: Secondary | ICD-10-CM | POA: Diagnosis not present

## 2016-07-22 DIAGNOSIS — Z8673 Personal history of transient ischemic attack (TIA), and cerebral infarction without residual deficits: Secondary | ICD-10-CM | POA: Diagnosis not present

## 2016-07-22 DIAGNOSIS — S22000A Wedge compression fracture of unspecified thoracic vertebra, initial encounter for closed fracture: Secondary | ICD-10-CM | POA: Diagnosis not present

## 2016-07-22 DIAGNOSIS — Z86010 Personal history of colonic polyps: Secondary | ICD-10-CM | POA: Diagnosis not present

## 2016-07-22 DIAGNOSIS — Z882 Allergy status to sulfonamides status: Secondary | ICD-10-CM

## 2016-07-22 DIAGNOSIS — M546 Pain in thoracic spine: Secondary | ICD-10-CM

## 2016-07-22 DIAGNOSIS — Z888 Allergy status to other drugs, medicaments and biological substances status: Secondary | ICD-10-CM

## 2016-07-22 DIAGNOSIS — Z7901 Long term (current) use of anticoagulants: Secondary | ICD-10-CM

## 2016-07-22 DIAGNOSIS — Z885 Allergy status to narcotic agent status: Secondary | ICD-10-CM

## 2016-07-22 DIAGNOSIS — S22000B Wedge compression fracture of unspecified thoracic vertebra, initial encounter for open fracture: Secondary | ICD-10-CM

## 2016-07-22 DIAGNOSIS — N182 Chronic kidney disease, stage 2 (mild): Secondary | ICD-10-CM

## 2016-07-22 DIAGNOSIS — I129 Hypertensive chronic kidney disease with stage 1 through stage 4 chronic kidney disease, or unspecified chronic kidney disease: Secondary | ICD-10-CM | POA: Diagnosis not present

## 2016-07-22 DIAGNOSIS — Z79899 Other long term (current) drug therapy: Secondary | ICD-10-CM | POA: Diagnosis not present

## 2016-07-22 DIAGNOSIS — I10 Essential (primary) hypertension: Secondary | ICD-10-CM

## 2016-07-22 DIAGNOSIS — R600 Localized edema: Secondary | ICD-10-CM | POA: Diagnosis not present

## 2016-07-22 DIAGNOSIS — M549 Dorsalgia, unspecified: Secondary | ICD-10-CM | POA: Diagnosis not present

## 2016-07-22 DIAGNOSIS — M810 Age-related osteoporosis without current pathological fracture: Secondary | ICD-10-CM | POA: Diagnosis not present

## 2016-07-22 DIAGNOSIS — N184 Chronic kidney disease, stage 4 (severe): Secondary | ICD-10-CM | POA: Diagnosis present

## 2016-07-22 DIAGNOSIS — N179 Acute kidney failure, unspecified: Secondary | ICD-10-CM | POA: Diagnosis not present

## 2016-07-22 DIAGNOSIS — M4854XA Collapsed vertebra, not elsewhere classified, thoracic region, initial encounter for fracture: Secondary | ICD-10-CM | POA: Diagnosis not present

## 2016-07-22 DIAGNOSIS — Q600 Renal agenesis, unilateral: Secondary | ICD-10-CM

## 2016-07-22 DIAGNOSIS — R609 Edema, unspecified: Secondary | ICD-10-CM

## 2016-07-22 DIAGNOSIS — N183 Chronic kidney disease, stage 3 (moderate): Secondary | ICD-10-CM | POA: Diagnosis not present

## 2016-07-22 DIAGNOSIS — E785 Hyperlipidemia, unspecified: Secondary | ICD-10-CM | POA: Diagnosis not present

## 2016-07-22 DIAGNOSIS — Z8249 Family history of ischemic heart disease and other diseases of the circulatory system: Secondary | ICD-10-CM | POA: Diagnosis not present

## 2016-07-22 DIAGNOSIS — I517 Cardiomegaly: Secondary | ICD-10-CM | POA: Diagnosis not present

## 2016-07-22 DIAGNOSIS — I480 Paroxysmal atrial fibrillation: Secondary | ICD-10-CM | POA: Diagnosis not present

## 2016-07-22 DIAGNOSIS — Z91048 Other nonmedicinal substance allergy status: Secondary | ICD-10-CM

## 2016-07-22 DIAGNOSIS — S22060A Wedge compression fracture of T7-T8 vertebra, initial encounter for closed fracture: Secondary | ICD-10-CM | POA: Diagnosis not present

## 2016-07-22 HISTORY — DX: Spondylosis without myelopathy or radiculopathy, lumbar region: M47.816

## 2016-07-22 LAB — CBC WITH DIFFERENTIAL/PLATELET
Basophils Absolute: 0 10*3/uL (ref 0.0–0.1)
Basophils Relative: 0 %
EOS ABS: 0 10*3/uL (ref 0.0–0.7)
Eosinophils Relative: 0 %
HEMATOCRIT: 35.4 % — AB (ref 36.0–46.0)
HEMOGLOBIN: 11.4 g/dL — AB (ref 12.0–15.0)
LYMPHS ABS: 0.7 10*3/uL (ref 0.7–4.0)
Lymphocytes Relative: 7 %
MCH: 29.8 pg (ref 26.0–34.0)
MCHC: 32.2 g/dL (ref 30.0–36.0)
MCV: 92.7 fL (ref 78.0–100.0)
Monocytes Absolute: 1.1 10*3/uL — ABNORMAL HIGH (ref 0.1–1.0)
Monocytes Relative: 12 %
NEUTROS ABS: 7.8 10*3/uL — AB (ref 1.7–7.7)
NEUTROS PCT: 81 %
Platelets: 299 10*3/uL (ref 150–400)
RBC: 3.82 MIL/uL — AB (ref 3.87–5.11)
RDW: 14.8 % (ref 11.5–15.5)
WBC: 9.6 10*3/uL (ref 4.0–10.5)

## 2016-07-22 LAB — BRAIN NATRIURETIC PEPTIDE: B NATRIURETIC PEPTIDE 5: 887.7 pg/mL — AB (ref 0.0–100.0)

## 2016-07-22 LAB — COMPREHENSIVE METABOLIC PANEL
ALT: 27 U/L (ref 14–54)
ANION GAP: 9 (ref 5–15)
AST: 35 U/L (ref 15–41)
Albumin: 2.8 g/dL — ABNORMAL LOW (ref 3.5–5.0)
Alkaline Phosphatase: 48 U/L (ref 38–126)
BUN: 43 mg/dL — ABNORMAL HIGH (ref 6–20)
CHLORIDE: 99 mmol/L — AB (ref 101–111)
CO2: 26 mmol/L (ref 22–32)
CREATININE: 1.59 mg/dL — AB (ref 0.44–1.00)
Calcium: 8.7 mg/dL — ABNORMAL LOW (ref 8.9–10.3)
GFR calc Af Amer: 34 mL/min — ABNORMAL LOW (ref >60)
GFR, EST NON AFRICAN AMERICAN: 29 mL/min — AB (ref >60)
Glucose, Bld: 117 mg/dL — ABNORMAL HIGH (ref 65–99)
POTASSIUM: 4.1 mmol/L (ref 3.5–5.1)
SODIUM: 134 mmol/L — AB (ref 135–145)
Total Bilirubin: 0.8 mg/dL (ref 0.3–1.2)
Total Protein: 5.3 g/dL — ABNORMAL LOW (ref 6.5–8.1)

## 2016-07-22 LAB — URINALYSIS, ROUTINE W REFLEX MICROSCOPIC
BILIRUBIN URINE: NEGATIVE
Bacteria, UA: NONE SEEN
Glucose, UA: NEGATIVE mg/dL
Hgb urine dipstick: NEGATIVE
KETONES UR: NEGATIVE mg/dL
Nitrite: NEGATIVE
PH: 5 (ref 5.0–8.0)
PROTEIN: NEGATIVE mg/dL
Specific Gravity, Urine: 1.016 (ref 1.005–1.030)

## 2016-07-22 LAB — TROPONIN I

## 2016-07-22 MED ORDER — AMLODIPINE BESYLATE 5 MG PO TABS
5.0000 mg | ORAL_TABLET | Freq: Two times a day (BID) | ORAL | Status: DC
  Administered 2016-07-23 – 2016-07-25 (×5): 5 mg via ORAL
  Filled 2016-07-22 (×6): qty 1

## 2016-07-22 MED ORDER — ACETAMINOPHEN 500 MG PO TABS
500.0000 mg | ORAL_TABLET | Freq: Four times a day (QID) | ORAL | Status: DC | PRN
  Administered 2016-07-23 – 2016-07-25 (×5): 500 mg via ORAL
  Filled 2016-07-22 (×5): qty 1

## 2016-07-22 MED ORDER — ONDANSETRON HCL 4 MG/2ML IJ SOLN
4.0000 mg | Freq: Once | INTRAMUSCULAR | Status: AC
  Administered 2016-07-22: 4 mg via INTRAVENOUS
  Filled 2016-07-22: qty 2

## 2016-07-22 MED ORDER — MAGNESIUM OXIDE 400 (241.3 MG) MG PO TABS
400.0000 mg | ORAL_TABLET | ORAL | Status: DC
  Administered 2016-07-23: 400 mg via ORAL
  Filled 2016-07-22 (×2): qty 1

## 2016-07-22 MED ORDER — ADULT MULTIVITAMIN W/MINERALS CH: 1.0000 | ORAL_TABLET | ORAL | Status: DC

## 2016-07-22 MED ORDER — MORPHINE SULFATE (PF) 4 MG/ML IV SOLN
4.0000 mg | Freq: Once | INTRAVENOUS | Status: AC
  Administered 2016-07-22: 4 mg via INTRAVENOUS
  Filled 2016-07-22: qty 1

## 2016-07-22 MED ORDER — METHOCARBAMOL 750 MG PO TABS
750.0000 mg | ORAL_TABLET | Freq: Three times a day (TID) | ORAL | Status: DC | PRN
  Administered 2016-07-23 – 2016-07-24 (×4): 750 mg via ORAL
  Filled 2016-07-22 (×5): qty 1

## 2016-07-22 MED ORDER — PRAVASTATIN SODIUM 40 MG PO TABS
40.0000 mg | ORAL_TABLET | Freq: Every evening | ORAL | Status: DC
  Administered 2016-07-23 – 2016-07-25 (×4): 40 mg via ORAL
  Filled 2016-07-22 (×4): qty 1

## 2016-07-22 MED ORDER — HYDRALAZINE HCL 50 MG PO TABS
75.0000 mg | ORAL_TABLET | Freq: Three times a day (TID) | ORAL | Status: DC
  Administered 2016-07-23 – 2016-07-25 (×9): 75 mg via ORAL
  Filled 2016-07-22 (×9): qty 1

## 2016-07-22 MED ORDER — HYDROMORPHONE HCL 2 MG/ML IJ SOLN
1.0000 mg | Freq: Once | INTRAMUSCULAR | Status: AC
  Administered 2016-07-22: 1 mg via INTRAVENOUS
  Filled 2016-07-22: qty 1

## 2016-07-22 MED ORDER — MORPHINE SULFATE (PF) 4 MG/ML IV SOLN
2.0000 mg | INTRAVENOUS | Status: DC | PRN
  Administered 2016-07-23 – 2016-07-24 (×6): 4 mg via INTRAVENOUS
  Administered 2016-07-24: 2 mg via INTRAVENOUS
  Administered 2016-07-24: 4 mg via INTRAVENOUS
  Filled 2016-07-22 (×8): qty 1

## 2016-07-22 MED ORDER — ATENOLOL-CHLORTHALIDONE 100-25 MG PO TABS: 1.0000 | ORAL_TABLET | Freq: Every morning | ORAL | Status: DC

## 2016-07-22 MED ORDER — ONDANSETRON HCL 4 MG PO TABS
2.0000 mg | ORAL_TABLET | Freq: Three times a day (TID) | ORAL | Status: DC | PRN
  Administered 2016-07-24: 4 mg via ORAL
  Filled 2016-07-22: qty 1

## 2016-07-22 NOTE — ED Triage Notes (Signed)
Pt reports mid back pain, started on right side and now on left side. Recent admission for same pain and abnormal renal functions. Reports no relief with vicodin that she was dc with.

## 2016-07-22 NOTE — Telephone Encounter (Signed)
Noted  

## 2016-07-22 NOTE — ED Notes (Signed)
Pt given [place] sandwich bag, water, graham crackers, and peanut butter with provider permission.

## 2016-07-22 NOTE — ED Notes (Addendum)
This RN attempted to get Iv access and was unsuccessful. Iv team consulted

## 2016-07-22 NOTE — Patient Instructions (Signed)
     IF you received an x-ray today, you will receive an invoice from [facility]. Please contact [facility] at [phone] with questions or concerns regarding your invoice.   IF you received labwork today, you will receive an invoice from [facility]. Please contact [facility] at [phone] with questions or concerns regarding your invoice.   Our billing staff will not be able to assist you with questions regarding bills from these companies.  You will be contacted with the lab results as soon as they are available. The fastest way to get your results is to activate your My Chart account. Instructions are located on the last page of this paperwork. If you have not heard from us regarding the results in 2 weeks, please contact this office.     

## 2016-07-22 NOTE — H&P (Addendum)
History and Physical    Valerie Terrell EGB:[id number] DOB: May 29, 1932 DOA: 07/22/2016   PCP: Tammi Sou, MD Chief Complaint:  Chief Complaint  Patient presents with  . Back Pain    HPI: Valerie Terrell is a 83 y.o. female with medical history significant of solitary kidney, CKD.  Patient was just discharged from our service 2 days ago for AKI on CKD and severe back pain.  AKI has resolved and creatinine continues to improve even today (now down to 1.5 compared to 1.9 on discharge and 4.x on admission).  Her severe back pain however has been uncontrollable at home even with robaxin and Vicodin.  Last admit work up for severe back pain included MRI L spine which wasn't really impressive.  On discharge they thought that pain was likely musculoskeletal.  ED Course: Today work up includes plain film X ray of T spine, which reveals T7 and T8 progressive compression fractures that appear new since CXR done in October!  Review of Systems: As per HPI otherwise 10 point review of systems negative.    Past Medical History:  Diagnosis Date  . Atrophy of left kidney    with absent blood flow by renal artery dopplers (Dr. Gwenlyn Terrell)  . 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 Chronic renal insufficiency, stage 2 (mild)    GFR @ 60.  01/2016 renal u/s showed atrophic/hypoplastic left kidney.  No hydronephrosis.  Small right renal cyst.  . History of adenomatous polyp of colon   . History of subarachnoid hemorrhage 10/2014   after syncope and while on xarelto  . Hyperlipidemia   . Hypertension    Difficult to control, in the setting of one functioning kidney: pt was referred to nephrology by Dr. Gwenlyn Terrell 06/2015.  . Lumbar radiculopathy 2012  . Lumbar spondylosis    MR 07/2016---no sign of spinal nerve compression or cord compression.  Pt set up with outpt ortho while admitted to hosp 07/2016.  . Metatarsal fracture 06/10/2016   Nondisplaced, left 5th metatarsal--pt was referred to ortho  .  Osteopenia 2014   T-score -2.1  . PAF (paroxysmal atrial fibrillation) (HCC)    Eliquis started after BRAO and CVA  . Stroke [facility])    cardioembolic (had CVA while on no anticoag)--"scattered subacute punctate infarcts: 1 in R parietal lobe and 2 in occipital cortex" on MRI br.  CT angio head/neck: aortic arch athero    Past Surgical History:  Procedure Laterality Date  . APPENDECTOMY  child  . CARDIOVASCULAR STRESS TEST  01/31/2016   Stress myoview: NORMAL/Low risk.  EF 56%.  . [place]  . COLONOSCOPY  2015   + hx of adenomatous polyps.  Need digest health spec in [place] records to see when pt due for next colonoscopy  . Renal artery dopplers  02/26/2016   Her right renal dimension was 11 cm pole to pole with mild to moderate right renal artery stenosis. A right renal aortic ratio was 3.22 suggesting less than a 50% stenosis.  . TONSILLECTOMY    . TRANSTHORACIC ECHOCARDIOGRAM  01/29/2016   EF 55-60%, normal LV wall motion, grade I DD.  No cardiac source of emboli was seen.     reports that she has never smoked. She has never used smokeless tobacco. She reports that she drinks about 0.6 oz of alcohol per week . She reports that she does not use drugs.  Allergies  Allergen Reactions  . Clonidine Derivatives Palpitations and Other (See Comments)  Very sedated  . Codeine Nausea Only  . Nickel Rash  . Sulfa Antibiotics Other (See Comments)    Reaction unknown    Family History  Problem Relation Age of Onset  . Heart disease Father   . Early death Father   . Sudden Cardiac Death Neg Hx       Prior to Admission medications   Medication Sig Start Date End Date Taking? Authorizing Provider  acetaminophen (TYLENOL) 500 MG tablet Take 500 mg by mouth every 6 (six) hours as needed for moderate pain.   Yes Historical Provider, MD  amLODipine (NORVASC) 5 MG tablet Take 1 tablet (5 mg total) by mouth 2 (two) times daily with breakfast and lunch. 05/25/16  Yes Lorretta Harp, MD  apixaban (ELIQUIS) 2.5 MG TABS tablet Take 1 tablet (2.5 mg total) by mouth 2 (two) times daily. 07/20/16  Yes Hosie Poisson, MD  atenolol-chlorthalidone (TENORETIC) 100-25 MG tablet Take 1 tablet by mouth every morning. 01/27/16  Yes Historical Provider, MD  Calcium Carb-Cholecalciferol (CALCIUM/VITAMIN D PO) Take 1 tablet by mouth 4 (four) times a week.   Yes Historical Provider, MD  hydrALAZINE (APRESOLINE) 25 MG tablet Take 3 tablets (75 mg total) by mouth 3 (three) times daily. 07/20/16  Yes Hosie Poisson, MD  HYDROcodone-acetaminophen (NORCO) 5-325 MG tablet Take 1 tablet by mouth every 8 (eight) hours as needed for moderate pain. 07/20/16  Yes Hosie Poisson, MD  Magnesium Gluconate (MAGNESIUM 27) 500 (27 Mg) MG TABS Take 500 mg by mouth 4 (four) times a week.    Yes Historical Provider, MD  methocarbamol (ROBAXIN) 750 MG tablet Take 1 tablet (750 mg total) by mouth every 8 (eight) hours as needed for muscle spasms. 07/20/16  Yes Hosie Poisson, MD  Multiple Vitamin (MULTIVITAMIN) tablet Take 1 tablet by mouth 3 (three) times a week.    Yes Historical Provider, MD  ondansetron (ZOFRAN) 4 MG tablet Take 1 tablet (4 mg total) by mouth every 8 (eight) hours as needed for nausea or vomiting. Patient taking differently: Take 2-4 mg by mouth every 8 (eight) hours as needed for nausea or vomiting.  07/08/16  Yes Renee A Kuneff, DO  pravastatin (PRAVACHOL) 40 MG tablet Take 40 mg by mouth every evening. 07/16/15  Yes Historical Provider, MD  vitamin C (ASCORBIC ACID) 500 MG tablet Take 500 mg by mouth daily.   Yes Historical Provider, MD    Physical Exam: Vitals:   07/22/16 1700 07/22/16 1730 07/22/16 1800 07/22/16 1900  BP: 167/67 164/65 153/65 163/58  Pulse: 71 67 65 66  Resp: 17 (!) 29 22 16   Temp:      TempSrc:      SpO2: 95% 94% 90% 92%      Constitutional: NAD, calm, comfortable Eyes: PERRL, lids and conjunctivae normal ENMT: Mucous membranes are moist. Posterior pharynx clear of any  exudate or lesions.Normal dentition.  Neck: normal, supple, no masses, no thyromegaly Respiratory: clear to auscultation bilaterally, no wheezing, no crackles. Normal respiratory effort. No accessory muscle use.  Cardiovascular: Regular rate and rhythm, no murmurs / rubs / gallops. No extremity edema. 2+ pedal pulses. No carotid bruits.  Abdomen: no tenderness, no masses palpated. No hepatosplenomegaly. Bowel sounds positive.  Musculoskeletal: Midline lower back tenderness Skin: no rashes, lesions, ulcers. No induration Neurologic: CN 2-12 grossly intact. Sensation intact, DTR normal. Strength 5/5 in all 4.  Psychiatric: Normal judgment and insight. Alert and oriented x 3. Normal mood.    Labs on Admission: I  have personally reviewed following labs and imaging studies  CBC:  Recent Labs Lab 07/16/16 0454 07/17/16 0513 07/18/16 0549 07/22/16 1749  WBC 9.4 5.9 3.6* 9.6  NEUTROABS  --   --   --  7.8*  HGB 12.3 10.4* 10.8* 11.4*  HCT 37.8 31.8* 33.9* 35.4*  MCV 91.3 91.6 91.9 92.7  PLT 310 234 252 335   Basic Metabolic Panel:  Recent Labs Lab 07/17/16 0513 07/18/16 0549 07/19/16 0510 07/20/16 0530 07/22/16 1749  NA 135 135 134* 134* 134*  K 3.7 4.4 5.3* 4.5 4.1  CL 101 102 103 105 99*  CO2 22 22 24 23 26   GLUCOSE 85 125* 117* 98 117*  BUN 53* 50* 50* 50* 43*  CREATININE 2.47* 2.11* 2.00* 1.92* 1.59*  CALCIUM 8.5* 9.0 9.3 8.7* 8.7*   GFR: Estimated Creatinine Clearance: 23.9 mL/min (by C-G formula based on SCr of 1.59 mg/dL (H)). Liver Function Tests:  Recent Labs Lab 07/22/16 1749  AST 35  ALT 27  ALKPHOS 48  BILITOT 0.8  PROT 5.3*  ALBUMIN 2.8*   No results for input(s): LIPASE, AMYLASE in the last 168 hours. No results for input(s): AMMONIA in the last 168 hours. Coagulation Profile: No results for input(s): INR, PROTIME in the last 168 hours. Cardiac Enzymes:  Recent Labs Lab 07/22/16 1749  TROPONINI <0.03   BNP (last 3 results) No results for  input(s): PROBNP in the last 8760 hours. HbA1C: No results for input(s): HGBA1C in the last 72 hours. CBG: No results for input(s): GLUCAP in the last 168 hours. Lipid Profile: No results for input(s): CHOL, HDL, LDLCALC, TRIG, CHOLHDL, LDLDIRECT in the last 72 hours. Thyroid Function Tests: No results for input(s): TSH, T4TOTAL, FREET4, T3FREE, THYROIDAB in the last 72 hours. Anemia Panel: No results for input(s): VITAMINB12, FOLATE, FERRITIN, TIBC, IRON, RETICCTPCT in the last 72 hours. Urine analysis:    Component Value Date/Time   COLORURINE YELLOW 07/22/2016 1500   APPEARANCEUR CLEAR 07/22/2016 1500   LABSPEC 1.016 07/22/2016 1500   PHURINE 5.0 07/22/2016 1500   GLUCOSEU NEGATIVE 07/22/2016 1500   HGBUR NEGATIVE 07/22/2016 1500   BILIRUBINUR NEGATIVE 07/22/2016 1500   KETONESUR NEGATIVE 07/22/2016 1500   PROTEINUR NEGATIVE 07/22/2016 1500   NITRITE NEGATIVE 07/22/2016 1500   LEUKOCYTESUR SMALL (A) 07/22/2016 1500   Sepsis Labs: @LABRCNTIP (procalcitonin:4,lacticidven:4) )No results Terrell for this or any previous visit (from the past 240 hour(s)).   Radiological Exams on Admission: Dg Thoracic Spine W/swimmers  Result Date: 07/22/2016 CLINICAL DATA:  Mid thoracic back pain for to 3 weeks. Two-view chest x-ray 07/11/2016. EXAM: THORACIC SPINE - 3 VIEWS COMPARISON:  Two-view chest x-ray 07/11/2016. Two-view chest x-ray a 02/27/2016 FINDINGS: The compression fracture at T8 is new since October and appears to have progressed. The T7 compression fracture is suspected as well. Heart is enlarged. A left pleural effusion is again seen. IMPRESSION: 1. Progressive T7 and T8 compression fractures. 2. Cardiomegaly and left pleural effusion. Electronically Signed   By: San Morelle M.D.   On: 07/22/2016 21:02   Dg Abd Acute W/chest  Result Date: 07/22/2016 CLINICAL DATA:  Severe back pain, swelling of the legs EXAM: DG ABDOMEN ACUTE W/ 1V CHEST COMPARISON:  Chest x-ray of 07/12/2016  and CT abdomen pelvis of 07/11/2016 FINDINGS: Moderate cardiomegaly is stable. No active infiltrate or effusion is seen. Mild fluid overload cannot be excluded. Supine and erect views the abdomen show a moderate amount of feces throughout the colon. No bowel obstruction is seen.  No free air is noted. The bones are osteopenic. IMPRESSION: 1. Cardiomegaly. No active lung disease. Question mild fluid overload. 2. Moderate amount of feces in the colon. No bowel obstruction or free air. Electronically Signed   By: Ivar Drape M.D.   On: 07/22/2016 16:32    EKG: Independently reviewed.  Assessment/Plan Principal Problem:   Thoracic compression fracture (HCC) Active Problems:   PAF (paroxysmal atrial fibrillation) (HCC)   Essential hypertension   CKD (chronic kidney disease), stage II    1. T7 and T8 compression fractures - 1. No mention on plain film of "pathologic fracture" features 1. Per radiologist I spoke to, plain film is usually "pretty good" for finding "usual spinal mets", however can miss some diseases like multiple myeloma. 2. However he notes that Multiple Myeloma should have shown up and been called on the L spine MRI done 6 days ago. 2. Progressive worsening and failed PO pain med therapy 3. Will therefore get IR evaluation and vertebroplasty / kyphoplasty if appropriate 1. NPO after midnight 2. Holding eliquis 4. Morphine for pain control for tonight. 2. HTN - continue home meds 3. PAF - hold eliquis 4. CKD - creat appears to be continuing to improve since discharge, now down to 1.5 (1.9 x2 days ago on discharge and 4.x on admission last time).   DVT prophylaxis: SCDs Code Status: Full Family Communication: No family in room Consults called: IR consult placed in computer system Admission status: Admit to obs   Etta Quill DO [facility] Pager [phone] from 7PM-7AM  If 7AM-7PM, please contact the day physician for the patient [email or web address] Password  [email or web address]  07/22/2016, 10:00 PM
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
## 2016-07-22 NOTE — ED Provider Notes (Signed)
[facility] DEPT Provider Note   CSN: [id number] Arrival date & time: 07/22/16  1315     History   Chief Complaint Chief Complaint  Patient presents with  . Back Pain    HPI Brynnleigh Mcelwee is a 83 y.o. female who was just discharged from the hospitalist service 2 days ago for acute on chronic renal failure, also evaluated during this admission for severe flank and back pain presenting with severe persistent pain in the back along with worsening bilateral leg edema.  She has been unable to ambulate since arriving home secondary to pain.  Daughter at the bedside endorses the slid out of bed last night in attempt to go to the bathroom, landed on the footstool next to her bed and was unable to get up. Pt states pain in her back but also the swelling and heaviness in her legs is preventing her from walking.  Daughter had to call ems to get her back into bed. She is taking vicodin prescribed when discharged 2 days ago which is not helping her pain.  Review of the chart indicates an MRI of her lumbar spine completed during her admission with some degenerative changes but no findings to suggest the source of degree of pain.  The history is provided by the patient and a relative.    Past Medical History:  Diagnosis Date  . Atrophy of left kidney    with absent blood flow by renal artery dopplers (Dr. Gwenlyn Found)  . 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 Chronic renal insufficiency, stage 2 (mild)    GFR @ 60.  01/2016 renal u/s showed atrophic/hypoplastic left kidney.  No hydronephrosis.  Small right renal cyst.  . History of adenomatous polyp of colon   . History of subarachnoid hemorrhage 10/2014   after syncope and while on xarelto  . Hyperlipidemia   . Hypertension    Difficult to control, in the setting of one functioning kidney: pt was referred to nephrology by Dr. Gwenlyn Found 06/2015.  . Lumbar radiculopathy 2012  . Lumbar spondylosis    MR 07/2016---no sign of spinal nerve  compression or cord compression.  Pt set up with outpt ortho while admitted to hosp 07/2016.  . Metatarsal fracture 06/10/2016   Nondisplaced, left 5th metatarsal--pt was referred to ortho  . Osteopenia 2014   T-score -2.1  . PAF (paroxysmal atrial fibrillation) (HCC)    Eliquis started after BRAO and CVA  . Stroke [facility])    cardioembolic (had CVA while on no anticoag)--"scattered subacute punctate infarcts: 1 in R parietal lobe and 2 in occipital cortex" on MRI br.  CT angio head/neck: aortic arch athero    Patient Active Problem List   Diagnosis Date Noted  . Acute renal failure superimposed on chronic kidney disease ([place]) 07/11/2016  . Flank pain 07/11/2016  . Hypoalbuminemia 07/11/2016  . Renal artery stenosis ([place]) 06/24/2016  . Nondisplaced fracture of fifth metatarsal bone, left foot, initial encounter for closed fracture 06/10/2016  . Rib contusion, left, initial encounter 06/10/2016  . Acute on chronic renal insufficiency 03/13/2016  . Single kidney 03/13/2016  . Chest pain   . Dyslipidemia   . PAF (paroxysmal atrial fibrillation) ([place]) 01/28/2016  . Essential hypertension 01/28/2016  . Cardioembolic stroke ([place]) 42/59/5638  . 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 Personal history of subarachnoid hemorrhage 01/28/2016  . CKD (chronic kidney disease), stage II 01/28/2016    Past Surgical History:  Procedure Laterality Date  .  APPENDECTOMY  child  . CARDIOVASCULAR STRESS TEST  01/31/2016   Stress myoview: NORMAL/Low risk.  EF 56%.  . [place]  . COLONOSCOPY  2015   + hx of adenomatous polyps.  Need digest health spec in [place] records to see when pt due for next colonoscopy  . Renal artery dopplers  02/26/2016   Her right renal dimension was 11 cm pole to pole with mild to moderate right renal artery stenosis. A right renal aortic ratio was 3.22 suggesting less than a 50% stenosis.  . TONSILLECTOMY    . TRANSTHORACIC ECHOCARDIOGRAM   01/29/2016   EF 55-60%, normal LV wall motion, grade I DD.  No cardiac source of emboli was seen.    OB History    No data available       Home Medications    Prior to Admission medications   Medication Sig Start Date End Date Taking? Authorizing Provider  acetaminophen (TYLENOL) 500 MG tablet Take 500 mg by mouth every 6 (six) hours as needed for moderate pain.   Yes Historical Provider, MD  amLODipine (NORVASC) 5 MG tablet Take 1 tablet (5 mg total) by mouth 2 (two) times daily with breakfast and lunch. 05/25/16  Yes Lorretta Harp, MD  apixaban (ELIQUIS) 2.5 MG TABS tablet Take 1 tablet (2.5 mg total) by mouth 2 (two) times daily. 07/20/16  Yes Hosie Poisson, MD  atenolol-chlorthalidone (TENORETIC) 100-25 MG tablet Take 1 tablet by mouth every morning. 01/27/16  Yes Historical Provider, MD  Calcium Carb-Cholecalciferol (CALCIUM/VITAMIN D PO) Take 1 tablet by mouth 4 (four) times a week.   Yes Historical Provider, MD  hydrALAZINE (APRESOLINE) 25 MG tablet Take 3 tablets (75 mg total) by mouth 3 (three) times daily. 07/20/16  Yes Hosie Poisson, MD  HYDROcodone-acetaminophen (NORCO) 5-325 MG tablet Take 1 tablet by mouth every 8 (eight) hours as needed for moderate pain. 07/20/16  Yes Hosie Poisson, MD  Magnesium Gluconate (MAGNESIUM 27) 500 (27 Mg) MG TABS Take 500 mg by mouth 4 (four) times a week.    Yes Historical Provider, MD  methocarbamol (ROBAXIN) 750 MG tablet Take 1 tablet (750 mg total) by mouth every 8 (eight) hours as needed for muscle spasms. 07/20/16  Yes Hosie Poisson, MD  Multiple Vitamin (MULTIVITAMIN) tablet Take 1 tablet by mouth 3 (three) times a week.    Yes Historical Provider, MD  ondansetron (ZOFRAN) 4 MG tablet Take 1 tablet (4 mg total) by mouth every 8 (eight) hours as needed for nausea or vomiting. Patient taking differently: Take 2-4 mg by mouth every 8 (eight) hours as needed for nausea or vomiting.  07/08/16  Yes Renee A Kuneff, DO  pravastatin (PRAVACHOL) 40 MG tablet  Take 40 mg by mouth every evening. 07/16/15  Yes Historical Provider, MD  traZODone (DESYREL) 50 MG tablet Take 1 tablet (50 mg total) by mouth at bedtime. 07/20/16  Yes Hosie Poisson, MD  vitamin C (ASCORBIC ACID) 500 MG tablet Take 500 mg by mouth daily.   Yes Historical Provider, MD  methylPREDNISolone (MEDROL DOSEPAK) 4 MG TBPK tablet Methylprednisolone 4 mg 1 tab tonight, followed by  Methylprednisolone 4 mg  2 tab tomorrow followed by Methylprednisolone 4 mg 1 TAB on Wednesday and stop. Patient not taking: Reported on 07/22/2016 07/20/16   Hosie Poisson, MD  Nutritional Supplements (FEEDING SUPPLEMENT, NEPRO CARB STEADY,) LIQD Take 237 mLs by mouth 2 (two) times daily between meals. Patient not taking: Reported on 07/22/2016 07/20/16   Hosie Poisson,  MD  pantoprazole (PROTONIX) 40 MG tablet Take 1 tablet (40 mg total) by mouth daily at 6 (six) AM. Patient not taking: Reported on 07/22/2016 07/21/16   Hosie Poisson, MD    Family History Family History  Problem Relation Age of Onset  . Heart disease Father   . Early death Father   . Sudden Cardiac Death Neg Hx     Social History Social History  Substance Use Topics  . Smoking status: Never Smoker  . Smokeless tobacco: Never Used  . Alcohol use 0.6 oz/week    1 Glasses of wine per week     Comment: wine     Allergies   Clonidine derivatives; Codeine; Nickel; and Sulfa antibiotics   Review of Systems Review of Systems  Constitutional: Negative for fever.  HENT: Negative for congestion and sore throat.   Eyes: Negative.   Respiratory: Negative for chest tightness and shortness of breath.   Cardiovascular: Positive for leg swelling. Negative for chest pain and palpitations.  Gastrointestinal: Negative for abdominal pain and nausea.  Genitourinary: Negative.   Musculoskeletal: Positive for back pain. Negative for arthralgias, joint swelling and neck pain.  Skin: Negative.  Negative for rash and wound.  Neurological: Positive for weakness.  Negative for dizziness, light-headedness, numbness and headaches.  Psychiatric/Behavioral: Negative.      Physical Exam Updated Vital Signs BP 163/58   Pulse 66   Temp 98.1 F (36.7 C) (Oral)   Resp 16   SpO2 92%   Physical Exam  Constitutional: She appears well-developed and well-nourished.  HENT:  Head: Normocephalic and atraumatic.  Eyes: Conjunctivae are normal.  Neck: Normal range of motion.  Cardiovascular: Normal rate, regular rhythm, normal heart sounds and intact distal pulses.   Pitting edema to upper thighs bilaterally, right greater than left.  Pulmonary/Chest: Effort normal and breath sounds normal. No respiratory distress. She has no wheezes.  Abdominal: Soft. Bowel sounds are normal. There is no tenderness.  Musculoskeletal: Normal range of motion. She exhibits tenderness.       Thoracic back: She exhibits bony tenderness.       Back:  Neurological: She is alert.  Skin: Skin is warm and dry.  Psychiatric: She has a normal mood and affect.  Nursing note and vitals reviewed.    ED Treatments / Results  Labs (all labs ordered are listed, but only abnormal results are displayed) Labs Reviewed  URINALYSIS, ROUTINE W REFLEX MICROSCOPIC - Abnormal; Notable for the following:       Result Value   Leukocytes, UA SMALL (*)    Squamous Epithelial / LPF 0-5 (*)    All other components within normal limits  COMPREHENSIVE METABOLIC PANEL - Abnormal; Notable for the following:    Sodium 134 (*)    Chloride 99 (*)    Glucose, Bld 117 (*)    BUN 43 (*)    Creatinine, Ser 1.59 (*)    Calcium 8.7 (*)    Total Protein 5.3 (*)    Albumin 2.8 (*)    GFR calc non Af Amer 29 (*)    GFR calc Af Amer 34 (*)    All other components within normal limits  CBC WITH DIFFERENTIAL/PLATELET - Abnormal; Notable for the following:    RBC 3.82 (*)    Hemoglobin 11.4 (*)    HCT 35.4 (*)    Neutro Abs 7.8 (*)    Monocytes Absolute 1.1 (*)    All other components within normal  limits  BRAIN NATRIURETIC  PEPTIDE - Abnormal; Notable for the following:    B Natriuretic Peptide 887.7 (*)    All other components within normal limits  TROPONIN I    EKG  EKG Interpretation  Date/Time:  Wednesday July 22 2016 18:19:54 EST Ventricular Rate:  68 PR Interval:    QRS Duration: 100 QT Interval:  403 QTC Calculation: 429 R Axis:   90 Text Interpretation:  Sinus rhythm Borderline right axis deviation Borderline T abnormalities, anterior leads No significant change since last tracing Confirmed by ISAACS MD, CAMERON [phone]) on 07/22/2016 8:36:38 PM       Radiology Dg Abd Acute W/chest  Result Date: 07/22/2016 CLINICAL DATA:  Severe back pain, swelling of the legs EXAM: DG ABDOMEN ACUTE W/ 1V CHEST COMPARISON:  Chest x-ray of 07/12/2016 and CT abdomen pelvis of 07/11/2016 FINDINGS: Moderate cardiomegaly is stable. No active infiltrate or effusion is seen. Mild fluid overload cannot be excluded. Supine and erect views the abdomen show a moderate amount of feces throughout the colon. No bowel obstruction is seen. No free air is noted. The bones are osteopenic. IMPRESSION: 1. Cardiomegaly. No active lung disease. Question mild fluid overload. 2. Moderate amount of feces in the colon. No bowel obstruction or free air. Electronically Signed   By: Ivar Drape M.D.   On: 07/22/2016 16:32    Procedures Procedures (including critical care time)  Medications Ordered in ED Medications  morphine 4 MG/ML injection 4 mg (4 mg Intravenous Given 07/22/16 1726)  ondansetron (ZOFRAN) injection 4 mg (4 mg Intravenous Given 07/22/16 1723)  HYDROmorphone (DILAUDID) injection 1 mg (1 mg Intravenous Given 07/22/16 2002)     Initial Impression / Assessment and Plan / ED Course  I have reviewed the triage vital signs and the nursing notes.  Pertinent labs & imaging results that were available during my care of the patient were reviewed by me and considered in my medical decision making (see chart  for details).     Pt with severe mid to lower thoracic pain and worsening peripheral edema.  She has been unable to ambulate since d/c to home 2 days ago.  Will discussed with case management about possibility of snf placement since she is unable to ambulate or care for self at home currently.    9:02 PM Attempt to contact case management - service not available this evening.  Will contact hospitalists for overnight obs and consideration of PT eval and/or snf placement if unable to control pain and resume ambulation.   Discussed with Dr. Ellender Hose who also saw patient.  Final Clinical Impressions(s) / ED Diagnoses   Final diagnoses:  Peripheral edema  Acute bilateral thoracic back pain    New Prescriptions New Prescriptions   No medications on file     Evalee Jefferson, Hershal Coria 07/22/16 2102    Duffy Bruce, MD 07/23/16 1540

## 2016-07-22 NOTE — Telephone Encounter (Signed)
Spoke with patient regarding back pain, reports pain of 8 or 9 out of 10 on pain scale. Patient states she has been taking Vicodin and Robaxin and using a heating pad with no relief. Patient advised to go to Emergency Department or Urgent Care for assessment and pain relief. Patient has appointment with PCP on 07/27/2016 for f/u from recent hospitalization. Patient plans to go to [facility] ED today.

## 2016-07-22 NOTE — ED Notes (Signed)
Patient transported to X-ray 

## 2016-07-22 NOTE — ED Notes (Signed)
Attempted to call report.  No answer on unit. 

## 2016-07-22 NOTE — Telephone Encounter (Signed)
SW pts daughter and she decided to give the call to pt. SW pt and she stated that her biggest problem is her back pain. She stated that the hydrocodone/apap she is taking is not strong enough for her pain. She stated that she has an apt with ortho but thinks its a few weeks away. She stated that the swelling comes and goes and due to the fact that she only has one kidney she can't take anything for her swelling and its not that big of a problem as her back pain. I offered to schedule pt with Dr. Anitra Lauth next week since he is out of the office this week. She stated that her pain is too severe and she can not wait til next week. I recommended that pt go to ER if her back pain is that severe and she stated that she just got out of the ER and they did not help her. I spoke with Roderic Ovens, RN and she is going to call pt to triage.

## 2016-07-22 NOTE — ED Notes (Addendum)
Attempted to call report.  No answer on unit. 

## 2016-07-22 NOTE — Progress Notes (Signed)
Valerie Terrell  MRN: [id number] DOB: 1932/07/06  Subjective:  Valerie Terrell is a 83 y.o. female with the primary complaints of severe intractable back pain and worsening bilateral leg edema. She was just discharged from the hospital 2 days ago for acute on chronic renal failure. Notes the back pain was present during her hospital stay but after multiple imaging studies without a definitive source for her pain. Since she was discharged she has been unable to ambulate due to the pain. Her daughter states they had to contact the fire department last night in order to move her from the wheelchair to the bed. When the patient woke up this morning she was in so much pain that the daughter did not know what to do. The patient has tried vicodin and robaxin without any relief of pain. States the only thing that seemed to touch it was morphine in the hospital.  Denies acute injury or change in pain, radiculopathy, saddle anesthesia, and bladder/bowel incontinence. Pt's daughter states she called her pt's PCP and he told her to take the patient immediately to our clinic or the ED. The daughter chose here thinking it would be a shorter wait.   Review of Systems  Patient Active Problem List   Diagnosis Date Noted  . Acute renal failure superimposed on chronic kidney disease ([place]) 07/11/2016  . Flank pain 07/11/2016  . Hypoalbuminemia 07/11/2016  . Renal artery stenosis ([place]) 06/24/2016  . Nondisplaced fracture of fifth metatarsal bone, left foot, initial encounter for closed fracture 06/10/2016  . Rib contusion, left, initial encounter 06/10/2016  . Acute on chronic renal insufficiency 03/13/2016  . Single kidney 03/13/2016  . Chest pain   . Dyslipidemia   . PAF (paroxysmal atrial fibrillation) ([place]) 01/28/2016  . Essential hypertension 01/28/2016  . Cardioembolic stroke ([place]) 32/20/2542  . 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 Personal history of subarachnoid hemorrhage 01/28/2016    . CKD (chronic kidney disease), stage II 01/28/2016    Current Outpatient Prescriptions on File Prior to Visit  Medication Sig Dispense Refill  . acetaminophen (TYLENOL) 500 MG tablet Take 500 mg by mouth every 6 (six) hours as needed for moderate pain.    [name] amLODipine (NORVASC) 5 MG tablet Take 1 tablet (5 mg total) by mouth 2 (two) times daily with breakfast and lunch. 180 tablet 3  . apixaban (ELIQUIS) 2.5 MG TABS tablet Take 1 tablet (2.5 mg total) by mouth 2 (two) times daily. 60 tablet 0  . atenolol-chlorthalidone (TENORETIC) 100-25 MG tablet Take 1 tablet by mouth every morning.    . Calcium Carb-Cholecalciferol (CALCIUM/VITAMIN D PO) Take 1 tablet by mouth 4 (four) times a week.    . hydrALAZINE (APRESOLINE) 25 MG tablet Take 3 tablets (75 mg total) by mouth 3 (three) times daily.    [name] HYDROcodone-acetaminophen (NORCO) 5-325 MG tablet Take 1 tablet by mouth every 8 (eight) hours as needed for moderate pain. 10 tablet 0  . Magnesium Gluconate (MAGNESIUM 27) 500 (27 Mg) MG TABS Take 500 mg by mouth 4 (four) times a week.     . methocarbamol (ROBAXIN) 750 MG tablet Take 1 tablet (750 mg total) by mouth every 8 (eight) hours as needed for muscle spasms. 15 tablet 0  . Multiple Vitamin (MULTIVITAMIN) tablet Take 1 tablet by mouth 3 (three) times a week.     . pravastatin (PRAVACHOL) 40 MG tablet Take 40 mg by mouth every evening.    . vitamin C (ASCORBIC ACID)  500 MG tablet Take 500 mg by mouth daily.    . methylPREDNISolone (MEDROL DOSEPAK) 4 MG TBPK tablet Methylprednisolone 4 mg 1 tab tonight, followed by  Methylprednisolone 4 mg  2 tab tomorrow followed by Methylprednisolone 4 mg 1 TAB on Wednesday and stop. 4 tablet 0  . Nutritional Supplements (FEEDING SUPPLEMENT, NEPRO CARB STEADY,) LIQD Take 237 mLs by mouth 2 (two) times daily between meals.  0  . ondansetron (ZOFRAN) 4 MG tablet Take 1 tablet (4 mg total) by mouth every 8 (eight) hours as needed for nausea or vomiting. (Patient  taking differently: Take 2-4 mg by mouth every 8 (eight) hours as needed for nausea or vomiting. ) 20 tablet 0  . pantoprazole (PROTONIX) 40 MG tablet Take 1 tablet (40 mg total) by mouth daily at 6 (six) AM. (Patient not taking: Reported on 07/22/2016) 5 tablet 0  . traZODone (DESYREL) 50 MG tablet Take 1 tablet (50 mg total) by mouth at bedtime. (Patient not taking: Reported on 07/22/2016) 10 tablet 0   No current facility-administered medications on file prior to visit.     Allergies  Allergen Reactions  . Clonidine Derivatives Palpitations and Other (See Comments)    Very sedated  . Codeine Nausea Only  . Nickel Rash  . Sulfa Antibiotics Other (See Comments)    Reaction unknown     Objective:  BP (!) 148/72   Pulse 64   Temp 97.7 F (36.5 C) (Oral)   Resp 16   Ht 5' (1.524 m)   SpO2 94%   Physical Exam  Constitutional: She is oriented to person, place, and time. She appears distressed (leaning forward in wheelchair holding right side back ).  HENT:  Head: Normocephalic and atraumatic.  Neck: Normal range of motion.  Pulmonary/Chest: Effort normal.  Musculoskeletal:       Thoracic back: She exhibits tenderness (exquisitley tender with palpation of right sided musculature).  Neurological: She is alert and oriented to person, place, and time.  Skin: Skin is warm and dry.  Psychiatric: Affect normal.  Vitals reviewed.  Assessment and Plan :  1. Right-sided thoracic back pain, unspecified chronicity Due to the acuity of her case and complexity of her disease state, informed the patient and her daughter that she will need an emergent work up and therefore should go immediately to the ED. Both the patient and the daughter agree. I recommended pt be transported by ambulance but daughter insisted they would go by self transport.    Tenna Delaine PA-C  [facility] 07/22/2016 12:11 PM

## 2016-07-23 ENCOUNTER — Observation Stay (HOSPITAL_COMMUNITY): Payer: Medicare Other

## 2016-07-23 DIAGNOSIS — N179 Acute kidney failure, unspecified: Secondary | ICD-10-CM | POA: Diagnosis not present

## 2016-07-23 DIAGNOSIS — M5124 Other intervertebral disc displacement, thoracic region: Secondary | ICD-10-CM | POA: Diagnosis not present

## 2016-07-23 DIAGNOSIS — M4854XA Collapsed vertebra, not elsewhere classified, thoracic region, initial encounter for fracture: Secondary | ICD-10-CM | POA: Diagnosis not present

## 2016-07-23 DIAGNOSIS — Z8249 Family history of ischemic heart disease and other diseases of the circulatory system: Secondary | ICD-10-CM | POA: Diagnosis not present

## 2016-07-23 DIAGNOSIS — S22000A Wedge compression fracture of unspecified thoracic vertebra, initial encounter for closed fracture: Secondary | ICD-10-CM | POA: Diagnosis present

## 2016-07-23 DIAGNOSIS — N183 Chronic kidney disease, stage 3 (moderate): Secondary | ICD-10-CM | POA: Diagnosis not present

## 2016-07-23 DIAGNOSIS — Q600 Renal agenesis, unilateral: Secondary | ICD-10-CM | POA: Diagnosis not present

## 2016-07-23 DIAGNOSIS — M546 Pain in thoracic spine: Secondary | ICD-10-CM | POA: Diagnosis not present

## 2016-07-23 DIAGNOSIS — I10 Essential (primary) hypertension: Secondary | ICD-10-CM | POA: Diagnosis not present

## 2016-07-23 DIAGNOSIS — E785 Hyperlipidemia, unspecified: Secondary | ICD-10-CM | POA: Diagnosis present

## 2016-07-23 DIAGNOSIS — Z79899 Other long term (current) drug therapy: Secondary | ICD-10-CM | POA: Diagnosis not present

## 2016-07-23 DIAGNOSIS — M810 Age-related osteoporosis without current pathological fracture: Secondary | ICD-10-CM | POA: Diagnosis present

## 2016-07-23 DIAGNOSIS — Z91048 Other nonmedicinal substance allergy status: Secondary | ICD-10-CM | POA: Diagnosis not present

## 2016-07-23 DIAGNOSIS — Z885 Allergy status to narcotic agent status: Secondary | ICD-10-CM | POA: Diagnosis not present

## 2016-07-23 DIAGNOSIS — N182 Chronic kidney disease, stage 2 (mild): Secondary | ICD-10-CM | POA: Diagnosis not present

## 2016-07-23 DIAGNOSIS — Z888 Allergy status to other drugs, medicaments and biological substances status: Secondary | ICD-10-CM | POA: Diagnosis not present

## 2016-07-23 DIAGNOSIS — I480 Paroxysmal atrial fibrillation: Secondary | ICD-10-CM | POA: Diagnosis not present

## 2016-07-23 DIAGNOSIS — Z882 Allergy status to sulfonamides status: Secondary | ICD-10-CM | POA: Diagnosis not present

## 2016-07-23 DIAGNOSIS — I129 Hypertensive chronic kidney disease with stage 1 through stage 4 chronic kidney disease, or unspecified chronic kidney disease: Secondary | ICD-10-CM | POA: Diagnosis present

## 2016-07-23 DIAGNOSIS — R079 Chest pain, unspecified: Secondary | ICD-10-CM | POA: Diagnosis not present

## 2016-07-23 DIAGNOSIS — M549 Dorsalgia, unspecified: Secondary | ICD-10-CM | POA: Diagnosis present

## 2016-07-23 DIAGNOSIS — Z8673 Personal history of transient ischemic attack (TIA), and cerebral infarction without residual deficits: Secondary | ICD-10-CM | POA: Diagnosis not present

## 2016-07-23 DIAGNOSIS — Z86010 Personal history of colonic polyps: Secondary | ICD-10-CM | POA: Diagnosis not present

## 2016-07-23 DIAGNOSIS — Z7901 Long term (current) use of anticoagulants: Secondary | ICD-10-CM | POA: Diagnosis not present

## 2016-07-23 LAB — HEPARIN LEVEL (UNFRACTIONATED): HEPARIN UNFRACTIONATED: 0.62 [IU]/mL (ref 0.30–0.70)

## 2016-07-23 LAB — APTT: aPTT: 34 seconds (ref 24–36)

## 2016-07-23 LAB — PROTIME-INR
INR: 0.87
PROTHROMBIN TIME: 11.8 s (ref 11.4–15.2)

## 2016-07-23 MED ORDER — ATENOLOL 100 MG PO TABS
100.0000 mg | ORAL_TABLET | Freq: Every day | ORAL | Status: DC
  Administered 2016-07-23 – 2016-07-25 (×3): 100 mg via ORAL
  Filled 2016-07-23 (×3): qty 1

## 2016-07-23 MED ORDER — CEFAZOLIN IN D5W 1 GM/50ML IV SOLN
1.0000 g | INTRAVENOUS | Status: AC
  Filled 2016-07-23: qty 50

## 2016-07-23 MED ORDER — HEPARIN (PORCINE) IN NACL 100-0.45 UNIT/ML-% IJ SOLN
900.0000 [IU]/h | INTRAMUSCULAR | Status: DC
  Administered 2016-07-23: 1000 [IU]/h via INTRAVENOUS
  Filled 2016-07-23: qty 250

## 2016-07-23 MED ORDER — CHLORTHALIDONE 25 MG PO TABS
25.0000 mg | ORAL_TABLET | Freq: Every day | ORAL | Status: DC
  Administered 2016-07-23 – 2016-07-25 (×3): 25 mg via ORAL
  Filled 2016-07-23 (×3): qty 1

## 2016-07-23 NOTE — Progress Notes (Signed)
ANTICOAGULATION CONSULT NOTE - Follow Up Consult  Pharmacy Consult for Heparin Indication: atrial fibrillation and stroke  Patient Measurements: Height: 5\' 5"  (165.1 cm) Weight: 153 lb 9.6 oz (69.7 kg) IBW/kg (Calculated) : 57 Heparin Dosing Weight: 69.7 kg  Labs:  Recent Labs  07/22/16 1749 07/23/16 0800 07/23/16 1842  HGB 11.4*  --   --   HCT 35.4*  --   --   PLT 299  --   --   APTT  --   --  34  LABPROT  --  11.8  --   INR  --  0.87  --   HEPARINUNFRC  --   --  0.62  CREATININE 1.59*  --   --   TROPONINI <0.03  --   --     Estimated Creatinine Clearance: 27.2 mL/min (by C-G formula based on SCr of 1.59 mg/dL (H)).  Assessment:    Apixaban on hold (last dose 3/7 at 8am) and IV heparin begun ~12n today at 1000 units/hr.    APTT tonight is 34 seconds, at baseline. Heparin level tonight is 0.62, likely falsely elevated due to recent Apixaban doses.       RN reports that heparin drip was held for MRI, which was completed ~6:30pm.  Labs were drawn upon return to the floor, but before heparin drip had been resumed, so will use as baseline labs.    Goal of Therapy:  Heparin level 0.3-0.7 units/ml aPTT 66-102 seconds  Monitor platelets by anticoagulation protocol: Yes   Plan:    Continue heparin drip at 1000 units/hr.   Heparin level, aPTT and CBC in ~6-8 hours and daily.  Arty Baumgartner, [place] Pager: [phone] 07/23/2016,8:33 PM

## 2016-07-23 NOTE — Progress Notes (Addendum)
PROGRESS NOTE    Valerie Terrell  YPP:[id number] DOB: 01-11-1933 DOA: 07/22/2016 PCP: Tammi Sou, MD   Brief Narrative:  Valerie Terrell is a 83 y.o. female with a history of taking kidney, CKD. Patient developed back pain found to have T7 and T8 compression fractures.   Assessment & Plan:   Principal Problem:   Thoracic compression fracture (HCC) Active Problems:   PAF (paroxysmal atrial fibrillation) (HCC)   Essential hypertension   CKD (chronic kidney disease), stage II   Thoracic compression fracture T7 and T8 compression fractures. Interventional radiology consulted and will consider kyphoplasty pending MRI results. -Interventional radiology recommendations  Paroxysmal atrial fibrillation -hold Eliquis secondary to procedure -heparin  Essential hypertension -continue atenolol/chlorthalidone -continue amlodipine  CKD stage II Solitary kidney Stable   DVT prophylaxis: Heparin drip  Code Status: Full code Family Communication: None at bedside Disposition Plan: Discharge pending back procedure   Consultants:   Interventional radiology  Procedures:   None  Antimicrobials:  None    Subjective: Patient reports some back pain that is intermittent. Controlled with analgesics  Objective: Vitals:   07/23/16 0003 07/23/16 0100 07/23/16 0106 07/23/16 0615  BP: 143/66 (!) 181/80 (!) 180/81 (!) 162/65  Pulse: 69 76  77  Resp: 24 20 20 20   Temp:  98.1 F (36.7 C) 98.1 F (36.7 C) 98 F (36.7 C)  TempSrc:  Oral Oral Oral  SpO2: 95% 95% 95% 98%  Weight:  69.7 kg (153 lb 9.6 oz)    Height:  5\' 5"  (1.651 m)     No intake or output data in the 24 hours ending 07/23/16 0930 Filed Weights   07/23/16 0100  Weight: 69.7 kg (153 lb 9.6 oz)    Examination:  General exam: Appears calm and comfortable Respiratory system: Clear to auscultation. Respiratory effort normal. Cardiovascular system: S1 & S2 heard, RRR. No murmurs Gastrointestinal system:  Abdomen is nondistended, soft and nontender. Normal bowel sounds heard. Central nervous system: Alert and oriented. No focal neurological deficits. Musculoskeletal: 2+ pitting edema. No calf tenderness. Mild thoracic spine tenderness Skin: No cyanosis. No rashes Psychiatry: Judgement and insight appear normal. Mood & affect appropriate.     Data Reviewed: I have personally reviewed following labs and imaging studies  CBC:  Recent Labs Lab 07/17/16 0513 07/18/16 0549 07/22/16 1749  WBC 5.9 3.6* 9.6  NEUTROABS  --   --  7.8*  HGB 10.4* 10.8* 11.4*  HCT 31.8* 33.9* 35.4*  MCV 91.6 91.9 92.7  PLT 234 252 458   Basic Metabolic Panel:  Recent Labs Lab 07/17/16 0513 07/18/16 0549 07/19/16 0510 07/20/16 0530 07/22/16 1749  NA 135 135 134* 134* 134*  K 3.7 4.4 5.3* 4.5 4.1  CL 101 102 103 105 99*  CO2 22 22 24 23 26   GLUCOSE 85 125* 117* 98 117*  BUN 53* 50* 50* 50* 43*  CREATININE 2.47* 2.11* 2.00* 1.92* 1.59*  CALCIUM 8.5* 9.0 9.3 8.7* 8.7*   GFR: Estimated Creatinine Clearance: 27.2 mL/min (by C-G formula based on SCr of 1.59 mg/dL (H)). Liver Function Tests:  Recent Labs Lab 07/22/16 1749  AST 35  ALT 27  ALKPHOS 48  BILITOT 0.8  PROT 5.3*  ALBUMIN 2.8*   No results for input(s): LIPASE, AMYLASE in the last 168 hours. No results for input(s): AMMONIA in the last 168 hours. Coagulation Profile:  Recent Labs Lab 07/23/16 0800  INR 0.87   Cardiac Enzymes:  Recent Labs Lab 07/22/16 1749  TROPONINI <0.03  BNP (last 3 results) No results for input(s): PROBNP in the last 8760 hours. HbA1C: No results for input(s): HGBA1C in the last 72 hours. CBG: No results for input(s): GLUCAP in the last 168 hours. Lipid Profile: No results for input(s): CHOL, HDL, LDLCALC, TRIG, CHOLHDL, LDLDIRECT in the last 72 hours. Thyroid Function Tests: No results for input(s): TSH, T4TOTAL, FREET4, T3FREE, THYROIDAB in the last 72 hours. Anemia Panel: No results for  input(s): VITAMINB12, FOLATE, FERRITIN, TIBC, IRON, RETICCTPCT in the last 72 hours. Sepsis Labs: No results for input(s): PROCALCITON, LATICACIDVEN in the last 168 hours.  No results found for this or any previous visit (from the past 240 hour(s)).       Radiology Studies: Dg Thoracic Spine W/swimmers  Result Date: 07/22/2016 CLINICAL DATA:  Mid thoracic back pain for to 3 weeks. Two-view chest x-ray 07/11/2016. EXAM: THORACIC SPINE - 3 VIEWS COMPARISON:  Two-view chest x-ray 07/11/2016. Two-view chest x-ray a 02/27/2016 FINDINGS: The compression fracture at T8 is new since October and appears to have progressed. The T7 compression fracture is suspected as well. Heart is enlarged. A left pleural effusion is again seen. IMPRESSION: 1. Progressive T7 and T8 compression fractures. 2. Cardiomegaly and left pleural effusion. Electronically Signed   By: San Morelle M.D.   On: 07/22/2016 21:02   Dg Abd Acute W/chest  Result Date: 07/22/2016 CLINICAL DATA:  Severe back pain, swelling of the legs EXAM: DG ABDOMEN ACUTE W/ 1V CHEST COMPARISON:  Chest x-ray of 07/12/2016 and CT abdomen pelvis of 07/11/2016 FINDINGS: Moderate cardiomegaly is stable. No active infiltrate or effusion is seen. Mild fluid overload cannot be excluded. Supine and erect views the abdomen show a moderate amount of feces throughout the colon. No bowel obstruction is seen. No free air is noted. The bones are osteopenic. IMPRESSION: 1. Cardiomegaly. No active lung disease. Question mild fluid overload. 2. Moderate amount of feces in the colon. No bowel obstruction or free air. Electronically Signed   By: Ivar Drape M.D.   On: 07/22/2016 16:32        Scheduled Meds: . amLODipine  5 mg Oral BID WC  . atenolol  100 mg Oral Daily   And  . chlorthalidone  25 mg Oral Daily  . hydrALAZINE  75 mg Oral TID  . magnesium oxide  400 mg Oral Once per day on Sun Tue Thu Sat  . [START ON 07/24/2016] multivitamin with minerals  1  tablet Oral Once per day on Mon Wed Fri  . pravastatin  40 mg Oral QPM   Continuous Infusions:   LOS: 0 days     Cordelia Poche [facility] 07/23/2016, 9:30 AM Pager: [phone]  If 7PM-7AM, please contact night-coverage [email or web address] Password [email or web address] 07/23/2016, 9:30 AM

## 2016-07-23 NOTE — Progress Notes (Signed)
[name], female   DOB: 1932/09/30, 83 y.o.   MRN: [id number] Aware of request for KP/VP of compression fractures on T7,8.  We will need to obtain and MRI of her thoracic spine prior to being able to determine if she is a candidate for this procedure.  This has been ordered.  She may eat today.  NPO p MN so if insurance authorization is approved, her MRI is sufficient to proceed, then we can move forward tomorrow.  She last took her eliquis on 3/7, so we can't proceed until 3/9.  We will await MRI results and then discuss further with the patient.  Selwyn Reason E 9:03 AM 07/23/2016

## 2016-07-23 NOTE — Progress Notes (Signed)
Per IR and pt, she can resume her diet until further evaluation. Diet ordered.   Ave Filter, RN

## 2016-07-23 NOTE — Progress Notes (Signed)
SCds unailbale at this time. Will continue to reorder item.   Ave Filter, RN

## 2016-07-23 NOTE — Care Management Note (Signed)
Case Management Note  Patient Details  Name: Valerie Terrell MRN: [id number] Date of Birth: 03/19/1933  Subjective/Objective:     CM following for progression and d/c planning.                Action/Plan: 07/23/2016 Noted CM consult and will follow for d/c needs as identified. Pt familiar to this CM from recent visit when pt declined HHPT services. Await recommendations.   Expected Discharge Date:                  Expected Discharge Plan:  [name]  In-House Referral:  Clinical Social Work  Discharge planning Services  CM Consult  Post Acute Care Choice:  Durable Medical Equipment, Home Health Choice offered to:     DME Arranged:    DME Agency:     HH Arranged:    [place] Agency:     Status of Service:  In process, will continue to follow  If discussed at Long Length of Stay Meetings, dates discussed:    Additional Comments:  Adron Bene, RN 07/23/2016, 1:36 PM

## 2016-07-23 NOTE — Progress Notes (Signed)
Patient arrived to unit via ED staff with belongings at bedside. Vitals stable. Admission completed. Continue to monitor.

## 2016-07-23 NOTE — Progress Notes (Signed)
ANTICOAGULATION CONSULT NOTE - Initial Consult  Pharmacy Consult for heparin Indication: atrial fibrillation and stroke  Allergies  Allergen Reactions  . Clonidine Derivatives Palpitations and Other (See Comments)    Very sedated  . Codeine Nausea Only  . Nickel Rash  . Sulfa Antibiotics Other (See Comments)    Reaction unknown    Patient Measurements: Height: 5\' 5"  (165.1 cm) Weight: 153 lb 9.6 oz (69.7 kg) IBW/kg (Calculated) : 57 Heparin Dosing Weight: 69.7 kg  Vital Signs: Temp: 97.8 F (36.6 C) (03/08 0945) Temp Source: Oral (03/08 0945) BP: 175/60 (03/08 0945) Pulse Rate: 66 (03/08 0945)  Labs:  Recent Labs  07/22/16 1749 07/23/16 0800  HGB 11.4*  --   HCT 35.4*  --   PLT 299  --   LABPROT  --  11.8  INR  --  0.87  CREATININE 1.59*  --   TROPONINI <0.03  --     Estimated Creatinine Clearance: 27.2 mL/min (by C-G formula based on SCr of 1.59 mg/dL (H)).  Assessment: CC/HPI: back pain - pt with T7/8 fx possible myeloma  PMH: CKD stage 2 with solitary kidney, HTN, HLD, hx of stroke, AF on warfarin  Anticoag: apixaban pta for AFib (last dose 3/7 0800) - holding in setting of planned IR procedure 3/9   Renal: SCr 1.59   Heme/Onc: H&H 11.4/35.4, Plt 299  Goal of Therapy:  Heparin level 0.3-0.7 units/ml Monitor platelets by anticoagulation protocol: Yes   Plan:  Heparin gtt with no bolus d/t apixaban pta Heparin gtt 1000 units/hr Initial lvl 1800 (HL and aptt) Daily HL, CBC, aptt until apixaban affects are gone F/U IR to determine when to hold prior to procedure  Levester Fresh, PharmD, BCPS, BCCCP Clinical Pharmacist Clinical phone for 07/23/2016 from 7a-3:30p: [phone] If after 3:30p, please call main pharmacy at: [phone] 07/23/2016 10:50 AM

## 2016-07-24 ENCOUNTER — Encounter (HOSPITAL_COMMUNITY): Payer: Self-pay | Admitting: General Surgery

## 2016-07-24 ENCOUNTER — Inpatient Hospital Stay (HOSPITAL_COMMUNITY): Payer: Medicare Other

## 2016-07-24 DIAGNOSIS — M4854XA Collapsed vertebra, not elsewhere classified, thoracic region, initial encounter for fracture: Principal | ICD-10-CM

## 2016-07-24 HISTORY — PX: IR GENERIC HISTORICAL: IMG1180011

## 2016-07-24 LAB — HEPARIN LEVEL (UNFRACTIONATED): Heparin Unfractionated: 1.07 IU/mL — ABNORMAL HIGH (ref 0.30–0.70)

## 2016-07-24 LAB — TROPONIN I: Troponin I: 0.03 ng/mL (ref <0.03)

## 2016-07-24 LAB — APTT: APTT: 111 s — AB (ref 24–36)

## 2016-07-24 MED ORDER — TOBRAMYCIN SULFATE 1.2 G IJ SOLR
INTRAMUSCULAR | Status: DC | PRN
  Administered 2016-07-24: 1.2 g via TOPICAL

## 2016-07-24 MED ORDER — TOBRAMYCIN SULFATE 1.2 G IJ SOLR
INTRAMUSCULAR | Status: AC
  Filled 2016-07-24: qty 1.2

## 2016-07-24 MED ORDER — MIDAZOLAM HCL 2 MG/2ML IJ SOLN
INTRAMUSCULAR | Status: DC | PRN
  Administered 2016-07-24 (×2): 1 mg via INTRAVENOUS

## 2016-07-24 MED ORDER — BUPIVACAINE HCL (PF) 0.25 % IJ SOLN
INTRAMUSCULAR | Status: DC | PRN
  Administered 2016-07-24: 20 mL

## 2016-07-24 MED ORDER — MIDAZOLAM HCL 2 MG/2ML IJ SOLN
INTRAMUSCULAR | Status: AC
  Filled 2016-07-24: qty 2

## 2016-07-24 MED ORDER — HYDRALAZINE HCL 20 MG/ML IJ SOLN
5.0000 mg | Freq: Once | INTRAMUSCULAR | Status: AC
  Administered 2016-07-24: 5 mg via INTRAVENOUS
  Filled 2016-07-24: qty 1

## 2016-07-24 MED ORDER — FENTANYL CITRATE (PF) 100 MCG/2ML IJ SOLN
INTRAMUSCULAR | Status: AC
  Filled 2016-07-24: qty 2

## 2016-07-24 MED ORDER — ENSURE ENLIVE PO LIQD
237.0000 mL | ORAL | Status: DC
  Administered 2016-07-24: 237 mL via ORAL

## 2016-07-24 MED ORDER — IOPAMIDOL (ISOVUE-300) INJECTION 61%
INTRAVENOUS | Status: AC
  Administered 2016-07-24: 10 mL
  Filled 2016-07-24: qty 50

## 2016-07-24 MED ORDER — BOOST / RESOURCE BREEZE PO LIQD
1.0000 | ORAL | Status: DC
  Administered 2016-07-25: 1 via ORAL
  Filled 2016-07-24 (×2): qty 1

## 2016-07-24 MED ORDER — FENTANYL CITRATE (PF) 100 MCG/2ML IJ SOLN
25.0000 ug | Freq: Once | INTRAMUSCULAR | Status: AC
  Administered 2016-07-24: 25 ug via INTRAVENOUS
  Filled 2016-07-24: qty 2

## 2016-07-24 MED ORDER — CEFAZOLIN SODIUM-DEXTROSE 2-4 GM/100ML-% IV SOLN
INTRAVENOUS | Status: AC
  Administered 2016-07-24: 1 g
  Filled 2016-07-24: qty 100

## 2016-07-24 MED ORDER — FENTANYL CITRATE (PF) 100 MCG/2ML IJ SOLN
INTRAMUSCULAR | Status: DC | PRN
  Administered 2016-07-24 (×4): 25 ug via INTRAVENOUS

## 2016-07-24 MED ORDER — BUPIVACAINE HCL (PF) 0.25 % IJ SOLN
INTRAMUSCULAR | Status: AC
  Filled 2016-07-24: qty 30

## 2016-07-24 NOTE — Sedation Documentation (Signed)
Patient is resting comfortably. 

## 2016-07-24 NOTE — Procedures (Signed)
S/P T 8 balloon KP

## 2016-07-24 NOTE — Sedation Documentation (Signed)
Grimacing, medicating

## 2016-07-24 NOTE — Discharge Instructions (Addendum)
Valerie Terrell  You were admitted for your back pain and you underwent a Balloon Kyphoplasty with good results. Please follow-up with your primary care physician as an outpatient. You will go home with home physical therapy.   Balloon Kyphoplasty, Care After Refer to this sheet in the next few weeks. These instructions provide you with information about caring for yourself after your procedure. Your health care provider may also give you more specific instructions. Your treatment has been planned according to current medical practices, but problems sometimes occur. Call your health care provider if you have any problems or questions after your procedure. What can I expect after the procedure? After your procedure, it is common to have back pain. Follow these instructions at home: Incision care   Follow instructions from your health care provider about how to take care of your incisions. Make sure you:  Wash your hands with soap and water before you change your bandage (dressing). If soap and water are not available, use hand sanitizer.  Change your dressing as told by your health care provider.  Leave stitches (sutures), skin glue, or adhesive strips in place. These skin closures may need to be in place for 2 weeks or longer. If adhesive strip edges start to loosen and curl up, you may trim the loose edges. Do not remove adhesive strips completely unless your health care provider tells you to do that.  Check your incision area every day for signs of infection. Watch for:  Redness, swelling, or pain.  Fluid, blood, or pus.  Keep your dressing dry until your health care provider says that it can be removed. Activity    Rest your back and avoid intense physical activity for as long as told by your health care provider.  Return to your normal activities as told by your health care provider. Ask your health care provider what activities are safe for you.  Do not lift anything that is  heavier than 10 lb (4.5 kg). This is about the weight of a gallon of milk.You may need to avoid heavy lifting for several weeks. General instructions   Take over-the-counter and prescription medicines only as told by your health care provider.  If directed, apply ice to the painful area:  Put ice in a plastic bag.  Place a towel between your skin and the bag.  Leave the ice on for 20 minutes, 2-3 times per day.  Do not use tobacco products, including cigarettes, chewing tobacco, or e-cigarettes. If you need help quitting, ask your health care provider.  Keep all follow-up visits as told by your health care provider. This is important. Contact a health care provider if:  You have a fever.  You have redness, swelling, or pain at the site of your incisions.  You have fluid, blood, or pus coming from your incisions.  You have pain that gets worse or does not get better with medicine.  You develop numbness or weakness in any part of your body. Get help right away if:  You have chest pain.  You have difficulty breathing.  You cannot move your legs.  You cannot control your bladder or bowel movements.  You suddenly become weak or numb on one side of your body.  You become very confused.  You have trouble speaking or understanding, or both. This information is not intended to replace advice given to you by your health care provider. Make sure you discuss any questions you have with your health care provider. Document Released:  01/23/2015 Document Revised: 10/10/2015 Document Reviewed: 08/27/2014 Elsevier Interactive Patient Education  2017 [employer].

## 2016-07-24 NOTE — Progress Notes (Addendum)
ANTICOAGULATION CONSULT NOTE - Follow Up Consult  Pharmacy Consult for heparin Indication: atrial fibrillation  Labs:  Recent Labs  07/22/16 1749 07/23/16 0800 07/23/16 1842 07/24/16 0258  HGB 11.4*  --   --   --   HCT 35.4*  --   --   --   PLT 299  --   --   --   APTT  --   --  34 111*  LABPROT  --  11.8  --   --   INR  --  0.87  --   --   HEPARINUNFRC  --   --  0.62 1.07*  CREATININE 1.59*  --   --   --   TROPONINI <0.03  --   --   --     Assessment: 83yo female above goal on heparin with initial dosing while Eliquis on hold.  Goal of Therapy:  aPTT 66-102 seconds   Plan:  Will decrease heparin gtt by 1-2 units/kg/hr to 900 units/hr and check aPTT in 8hr.  Wynona Neat, PharmD, BCPS  07/24/2016,3:56 AM

## 2016-07-24 NOTE — Consult Note (Signed)
   [facility] CM Inpatient Consult   07/24/2016  Jordanne Elsbury [birth year] [id number]   Patient on registry for 4 admissions in 6 months. Patient in procedures today. Will follow as appropriate for assessment for community care management needs. For questions,  Natividad Brood, RN BSN [facility] Liaison  [phone] business mobile phone Toll free office [phone]

## 2016-07-24 NOTE — Progress Notes (Signed)
Initial Nutrition Assessment   INTERVENTION:  Provide Ensure Enlive po once daily, each supplement provides 350 kcal and 20 grams of protein  Provide Boost Breeze po once daily, each supplement provides 250 kcal and 9 grams of protein   NUTRITION DIAGNOSIS:   Inadequate protein intake related to poor appetite (vegetarian diet) as evidenced by per patient/family report.   GOAL:   Patient will meet greater than or equal to 90% of their needs   MONITOR:   PO intake, Supplement acceptance, Skin, Weight trends, Labs, I & O's  REASON FOR ASSESSMENT:   Consult Assessment of nutrition requirement/status  ASSESSMENT:   83 y.o. female with medical history significant of solitary kidney, CKD.  Patient was just discharged from our service 2 days ago for AKI on CKD and severe back pain.  AKI has resolved and creatinine continues to improve even today (now down to 1.5 compared to 1.9 on discharge and 4.x on admission).  Her severe back pain however has been uncontrollable at home even with robaxin and Vicodin. work up includes plain film X ray of T spine, which reveals T7 and T8 progressive compression fractures   Pt states that her labs showed low protein. She reports following a vegetarian diet. RD assessed pt's food sources of protein which is limited to mostly yogurt; pt states that she does not eats beans, nuts, tofu, or drink milk. Pt shows no signs of muscle or fat wasting per nutrition-focused physical exam, but pt's muscles tone noted to be very low. Edema noted in legs and arms. Pt denies any weight loss. Pt states that she would like to receive a protein shake and was agreeable to Ensure Enlive.  Pt is NPO for surgery today. Per nursing notes, pt has been eating 50 to 90% of meals.   Labs: low hemoglobin, low sodium, low chloride, elevated BUN/Creatinine, low calcium  Diet Order:  Diet NPO time specified Except for: Sips with Meds  Skin:  Reviewed, no issues  Last BM:   3/8  Height:   Ht Readings from Last 1 Encounters:  07/23/16 5\' 5"  (1.651 m)    Weight:   Wt Readings from Last 1 Encounters:  07/23/16 153 lb 9.6 oz (69.7 kg)    Ideal Body Weight:  56.8 kg  BMI:  Body mass index is 25.56 kg/m.  Estimated Nutritional Needs:   Kcal:  1550-1750  Protein:  70-85 grams  Fluid:  1.6-1.8 L/day  EDUCATION NEEDS:   No education needs identified at this time  Scarlette Ar RD, LDN, CSP Inpatient Clinical Dietitian Pager: [phone] After Hours Pager: [phone]

## 2016-07-24 NOTE — Progress Notes (Signed)
PROGRESS NOTE    Valerie Terrell  SEG:[id number] DOB: 18-Jan-1933 DOA: 07/22/2016 PCP: Tammi Sou, MD   Brief Narrative:  Alexia Dinger is a 83 y.o. female with a history of taking kidney, CKD. Patient developed back pain found to have T7 and T8 compression fractures.   Assessment & Plan:   Principal Problem:   Thoracic compression fracture (HCC) Active Problems:   PAF (paroxysmal atrial fibrillation) (HCC)   Essential hypertension   CKD (chronic kidney disease), stage II   Compression fracture of body of thoracic vertebra (HCC)   Thoracic compression fracture T7 and T8 compression fractures. Interventional radiology consulted and will consider kyphoplasty pending MRI results. -Interventional radiology recommendations: for procedure today  Paroxysmal atrial fibrillation -hold Eliquis secondary to procedure -heparin  Essential hypertension Uncontrolled likely secondary to pain -continue atenolol/chlorthalidone -continue amlodipine  CKD stage III Solitary kidney Stable  Chest pain Atypical sharp pain. Seems this was difficult for patient to quantify and that she is "in pain everywhere." No chest pain currently. EKG unremarkable for significant change from baseline. Troponin 0.03 in setting of acute kidney injury in setting of chronic kidney disease. Patient very anxious secondary to back pain.   DVT prophylaxis: Heparin drip  Code Status: Full code Family Communication: None at bedside Disposition Plan: Discharge pending back procedure   Consultants:   Interventional radiology  Procedures:   None  Antimicrobials:  None    Subjective: Back pain is significant today. She reports having chest pain earlier today that resolved spontaneously. She describes it as sharp but that she had pain everywhere as well. No dyspnea, palpitations, nausea or vomiting.  Objective: Vitals:   07/23/16 1732 07/23/16 2148 07/24/16 0406 07/24/16 0637  BP: (!) 172/67 (!)  183/92 (!) 191/63 (!) 177/56  Pulse: 61 68 68 63  Resp: 20 20  16   Temp: 98.2 F (36.8 C) 98 F (36.7 C)  98.4 F (36.9 C)  TempSrc: Oral Oral  Oral  SpO2: 92% 94%  98%  Weight:      Height:        Intake/Output Summary (Last 24 hours) at 07/24/16 0912 Last data filed at 07/23/16 1900  Gross per 24 hour  Intake           665.33 ml  Output                0 ml  Net           665.33 ml   Filed Weights   07/23/16 0100  Weight: 69.7 kg (153 lb 9.6 oz)    Examination:  General exam: Appears calm and comfortable Respiratory system: Clear to auscultation. Respiratory effort normal. Cardiovascular system: S1 & S2 heard, RRR. No murmurs Gastrointestinal system: Abdomen is nondistended, soft and nontender. Normal bowel sounds heard. Central nervous system: Alert and oriented. No focal neurological deficits. Musculoskeletal: 2+ pitting edema. No calf tenderness. Mild thoracic spine tenderness Skin: No cyanosis. No rashes Psychiatry: Judgement and insight appear normal. Mood & affect appropriate.     Data Reviewed: I have personally reviewed following labs and imaging studies  CBC:  Recent Labs Lab 07/18/16 0549 07/22/16 1749  WBC 3.6* 9.6  NEUTROABS  --  7.8*  HGB 10.8* 11.4*  HCT 33.9* 35.4*  MCV 91.9 92.7  PLT 252 737   Basic Metabolic Panel:  Recent Labs Lab 07/18/16 0549 07/19/16 0510 07/20/16 0530 07/22/16 1749  NA 135 134* 134* 134*  K 4.4 5.3* 4.5 4.1  CL 102 103 105 99*  CO2 22 24 23 26   GLUCOSE 125* 117* 98 117*  BUN 50* 50* 50* 43*  CREATININE 2.11* 2.00* 1.92* 1.59*  CALCIUM 9.0 9.3 8.7* 8.7*   GFR: Estimated Creatinine Clearance: 27.2 mL/min (by C-G formula based on SCr of 1.59 mg/dL (H)). Liver Function Tests:  Recent Labs Lab 07/22/16 1749  AST 35  ALT 27  ALKPHOS 48  BILITOT 0.8  PROT 5.3*  ALBUMIN 2.8*   No results for input(s): LIPASE, AMYLASE in the last 168 hours. No results for input(s): AMMONIA in the last 168  hours. Coagulation Profile:  Recent Labs Lab 07/23/16 0800  INR 0.87   Cardiac Enzymes:  Recent Labs Lab 07/22/16 1749 07/24/16 0649  TROPONINI <0.03 0.03*   BNP (last 3 results) No results for input(s): PROBNP in the last 8760 hours. HbA1C: No results for input(s): HGBA1C in the last 72 hours. CBG: No results for input(s): GLUCAP in the last 168 hours. Lipid Profile: No results for input(s): CHOL, HDL, LDLCALC, TRIG, CHOLHDL, LDLDIRECT in the last 72 hours. Thyroid Function Tests: No results for input(s): TSH, T4TOTAL, FREET4, T3FREE, THYROIDAB in the last 72 hours. Anemia Panel: No results for input(s): VITAMINB12, FOLATE, FERRITIN, TIBC, IRON, RETICCTPCT in the last 72 hours. Sepsis Labs: No results for input(s): PROCALCITON, LATICACIDVEN in the last 168 hours.  No results found for this or any previous visit (from the past 240 hour(s)).       Radiology Studies: Dg Thoracic Spine W/swimmers  Result Date: 07/22/2016 CLINICAL DATA:  Mid thoracic back pain for to 3 weeks. Two-view chest x-ray 07/11/2016. EXAM: THORACIC SPINE - 3 VIEWS COMPARISON:  Two-view chest x-ray 07/11/2016. Two-view chest x-ray a 02/27/2016 FINDINGS: The compression fracture at T8 is new since October and appears to have progressed. The T7 compression fracture is suspected as well. Heart is enlarged. A left pleural effusion is again seen. IMPRESSION: 1. Progressive T7 and T8 compression fractures. 2. Cardiomegaly and left pleural effusion. Electronically Signed   By: San Morelle M.D.   On: 07/22/2016 21:02   Mr Thoracic Spine Wo Contrast  Result Date: 07/23/2016 CLINICAL DATA:  83 y/o  F; several weeks of back pain. EXAM: MRI THORACIC SPINE WITHOUT CONTRAST TECHNIQUE: Multiplanar, multisequence MR imaging of the thoracic spine was performed. No intravenous contrast was administered. COMPARISON:  07/22/2016 thoracic spine radiograph FINDINGS: Alignment:  Physiologic. Vertebrae: 50% moderate  compression deformity of the T8 vertebral body with edema indicating recent injury. No significant retropulsion. No evidence of discitis, additional acute fracture, or suspicious osseous lesion. Cord:  No abnormal cord signal. Paraspinal and other soft tissues: Left kidney atrophy. Disc levels: Central protrusion at the T7-8 level with anterior cord impingement and mild flattening. No significant canal stenosis. At the T7-8 9 level on the right there is a 7 mm T2 hyperintense structure which is centered just anterior to the right-sided T8-9 facet in the extradural space, probably a synovial cyst. The cyst likely impinges on the exiting T8 nerve root. Mild bilateral degenerative facet edema at T8-9 and on the right at T6-7. IMPRESSION: 1. 50% moderate compression deformity of the T8 vertebral body with edema indicating recent injury. No significant retropulsion. 2. No evidence of discitis, additional acute fracture, or suspicious osseous lesion. 3. Central protrusion at the T7-8 level with anterior cord impingement and mild flattening. 4. At the T7-8 9 level on the right there is a 7 mm T2 hyperintense structure which is centered just anterior to the right-sided T8-9 facet in the  extradural space, probably a synovial cyst. The cyst likely impinges on the exiting T8 nerve root. 5. Mild bilateral degenerative facet edema at T8-9 and on the right at T6-7. Electronically Signed   By: Kristine Garbe M.D.   On: 07/23/2016 18:51   Dg Abd Acute W/chest  Result Date: 07/22/2016 CLINICAL DATA:  Severe back pain, swelling of the legs EXAM: DG ABDOMEN ACUTE W/ 1V CHEST COMPARISON:  Chest x-ray of 07/12/2016 and CT abdomen pelvis of 07/11/2016 FINDINGS: Moderate cardiomegaly is stable. No active infiltrate or effusion is seen. Mild fluid overload cannot be excluded. Supine and erect views the abdomen show a moderate amount of feces throughout the colon. No bowel obstruction is seen. No free air is noted. The bones  are osteopenic. IMPRESSION: 1. Cardiomegaly. No active lung disease. Question mild fluid overload. 2. Moderate amount of feces in the colon. No bowel obstruction or free air. Electronically Signed   By: Ivar Drape M.D.   On: 07/22/2016 16:32        Scheduled Meds: . amLODipine  5 mg Oral BID WC  . atenolol  100 mg Oral Daily   And  . chlorthalidone  25 mg Oral Daily  .  ceFAZolin (ANCEF) IV  1 g Intravenous to XRAY  . fentaNYL (SUBLIMAZE) injection  25 mcg Intravenous Once  . hydrALAZINE  75 mg Oral TID  . magnesium oxide  400 mg Oral Once per day on Sun Tue Thu Sat  . multivitamin with minerals  1 tablet Oral Once per day on Mon Wed Fri  . pravastatin  40 mg Oral QPM   Continuous Infusions: . heparin 900 Units/hr (07/24/16 0405)     LOS: 1 day     Cordelia Poche [facility] 07/24/2016, 9:12 AM Pager: [phone]  If 7PM-7AM, please contact night-coverage [email or web address] Password [email or web address] 07/24/2016, 9:12 AM

## 2016-07-24 NOTE — Progress Notes (Signed)
Pt is non compliant with bed rest lying supine for 3 hours, Nurse provided education, Pt insist on sitting on bed side.

## 2016-07-24 NOTE — Sedation Documentation (Signed)
Grimacing medicatated

## 2016-07-24 NOTE — Consult Note (Signed)
Chief Complaint: T7,T8 compression fractures  Referring Physician:Dr. Cordelia Poche  Supervising Physician: Luanne Bras  Patient Status: [facility] - In-pt  HPI: Valerie Terrell is an 83 y.o. female who has been readmitted to the hospital just 2 days after her last discharge secondary to uncontrolled back pain.  She has had this pain for the last 2-3 weeks.  She complained apparently her last visit of low back pain and an MRI lumbar spine was completed which did not show cause.  She was placed on a steroid dose pack and informed to follow up with ortho.  However, her vicodin was not controlling her pain at home and she presented back to the [facility].  She had a plain thoracic film which revealed T7,8 compression fractures.  She also has CHF with a BNP of 887 as well.  She underwent an MRI of the thoracic spine which has been reviewed with Dr. Estanislado Pandy to consideration of KP/VP.  Past Medical History:  Past Medical History:  Diagnosis Date  . Atrophy of left kidney    with absent blood flow by renal artery dopplers (Dr. Gwenlyn Found)  . 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 Chronic renal insufficiency, stage 2 (mild)    GFR @ 60.  01/2016 renal u/s showed atrophic/hypoplastic left kidney.  No hydronephrosis.  Small right renal cyst.  . History of adenomatous polyp of colon   . History of subarachnoid hemorrhage 10/2014   after syncope and while on xarelto  . Hyperlipidemia   . Hypertension    Difficult to control, in the setting of one functioning kidney: pt was referred to nephrology by Dr. Gwenlyn Found 06/2015.  . Lumbar radiculopathy 2012  . Lumbar spondylosis    MR 07/2016---no sign of spinal nerve compression or cord compression.  Pt set up with outpt ortho while admitted to hosp 07/2016.  . Metatarsal fracture 06/10/2016   Nondisplaced, left 5th metatarsal--pt was referred to ortho  . Osteopenia 2014   T-score -2.1  . PAF (paroxysmal atrial fibrillation) (HCC)    Eliquis started  after BRAO and CVA  . Stroke [facility])    cardioembolic (had CVA while on no anticoag)--"scattered subacute punctate infarcts: 1 in R parietal lobe and 2 in occipital cortex" on MRI br.  CT angio head/neck: aortic arch athero    Past Surgical History:  Past Surgical History:  Procedure Laterality Date  . APPENDECTOMY  child  . CARDIOVASCULAR STRESS TEST  01/31/2016   Stress myoview: NORMAL/Low risk.  EF 56%.  . [place]  . COLONOSCOPY  2015   + hx of adenomatous polyps.  Need digest health spec in [place] records to see when pt due for next colonoscopy  . Renal artery dopplers  02/26/2016   Her right renal dimension was 11 cm pole to pole with mild to moderate right renal artery stenosis. A right renal aortic ratio was 3.22 suggesting less than a 50% stenosis.  . TONSILLECTOMY    . TRANSTHORACIC ECHOCARDIOGRAM  01/29/2016   EF 55-60%, normal LV wall motion, grade I DD.  No cardiac source of emboli was seen.    Family History:  Family History  Problem Relation Age of Onset  . Heart disease Father   . Early death Father   . Sudden Cardiac Death Neg Hx     Social History:  reports that she has never smoked. She has never used smokeless tobacco. She reports that she drinks about 0.6 oz of alcohol per week .  She reports that she does not use drugs.  Allergies:  Allergies  Allergen Reactions  . Clonidine Derivatives Palpitations and Other (See Comments)    Very sedated  . Codeine Nausea Only  . Nickel Rash  . Sulfa Antibiotics Other (See Comments)    Reaction unknown    Medications: Medications reviewed in epic.  Heparin dripped stopped at 0800am this morning.  Please HPI for pertinent positives, otherwise complete 10 system ROS negative.  Mallampati Score: MD Evaluation Airway: WNL Heart: WNL Abdomen: WNL Chest/ Lungs: WNL ASA  Classification: 3 Mallampati/Airway Score: One  Physical Exam: BP (!) 177/58 (BP Location: Right Arm)   Pulse 64   Temp 98.6 F  (37 C) (Oral)   Resp 18   Ht _0  (1.651 m)   Wt 153 lb 9.6 oz (69.7 kg)   SpO2 94%   BMI 25.56 kg/m  Body mass index is 25.56 kg/m. General: pleasant, obese white female who is laying in bed in NAD HEENT: head is normocephalic, atraumatic.  Sclera are noninjected.  PERRL.  Ears and nose without any masses or lesions.  Mouth is pink and moist Heart: regular, rate, and rhythm.  Normal s1,s2. No obvious murmurs, gallops, or rubs noted.  Palpable radial and pedal pulses bilaterally Lungs: CTAB, no wheezes, rhonchi, or rales noted.  Respiratory effort nonlabored Abd: soft, NT, ND/obese, +BS, no masses, hernias, or organomegaly Psych: A&Ox3 with an appropriate affect.   Labs: Results for orders placed or performed during the hospital encounter of 07/22/16 (from the past 48 hour(s))  Urinalysis, Routine w reflex microscopic- may I&O cath if menses     Status: Abnormal   Collection Time: 07/22/16  3:00 PM  Result Value Ref Range   Color, Urine YELLOW YELLOW   APPearance CLEAR CLEAR   Specific Gravity, Urine 1.016 1.005 - 1.030   pH 5.0 5.0 - 8.0   Glucose, UA NEGATIVE NEGATIVE mg/dL   Hgb urine dipstick NEGATIVE NEGATIVE   Bilirubin Urine NEGATIVE NEGATIVE   Ketones, ur NEGATIVE NEGATIVE mg/dL   Protein, ur NEGATIVE NEGATIVE mg/dL   Nitrite NEGATIVE NEGATIVE   Leukocytes, UA SMALL (A) NEGATIVE   RBC / HPF 0-5 0 - 5 RBC/hpf   WBC, UA 0-5 0 - 5 WBC/hpf   Bacteria, UA NONE SEEN NONE SEEN   Squamous Epithelial / LPF 0-5 (A) NONE SEEN   Hyaline Casts, UA PRESENT   Comprehensive metabolic panel     Status: Abnormal   Collection Time: 07/22/16  5:49 PM  Result Value Ref Range   Sodium 134 (L) 135 - 145 mmol/L   Potassium 4.1 3.5 - 5.1 mmol/L   Chloride 99 (L) 101 - 111 mmol/L   CO2 26 22 - 32 mmol/L   Glucose, Bld 117 (H) 65 - 99 mg/dL   BUN 43 (H) 6 - 20 mg/dL   Creatinine, Ser 1.59 (H) 0.44 - 1.00 mg/dL   Calcium 8.7 (L) 8.9 - 10.3 mg/dL   Total Protein 5.3 (L) 6.5 - 8.1 g/dL    Albumin 2.8 (L) 3.5 - 5.0 g/dL   AST 35 15 - 41 U/L   ALT 27 14 - 54 U/L   Alkaline Phosphatase 48 38 - 126 U/L   Total Bilirubin 0.8 0.3 - 1.2 mg/dL   GFR calc non Af Amer 29 (L) >60 mL/min   GFR calc Af Amer 34 (L) >60 mL/min    Comment: (NOTE) The eGFR has been calculated using the CKD EPI equation. This calculation  has not been validated in all clinical situations. eGFR's persistently <60 mL/min signify possible Chronic Kidney Disease.    Anion gap 9 5 - 15  CBC with Differential     Status: Abnormal   Collection Time: 07/22/16  5:49 PM  Result Value Ref Range   WBC 9.6 4.0 - 10.5 K/uL   RBC 3.82 (L) 3.87 - 5.11 MIL/uL   Hemoglobin 11.4 (L) 12.0 - 15.0 g/dL   HCT 35.4 (L) 36.0 - 46.0 %   MCV 92.7 78.0 - 100.0 fL   MCH 29.8 26.0 - 34.0 pg   MCHC 32.2 30.0 - 36.0 g/dL   RDW 14.8 11.5 - 15.5 %   Platelets 299 150 - 400 K/uL   Neutrophils Relative % 81 %   Neutro Abs 7.8 (H) 1.7 - 7.7 K/uL   Lymphocytes Relative 7 %   Lymphs Abs 0.7 0.7 - 4.0 K/uL   Monocytes Relative 12 %   Monocytes Absolute 1.1 (H) 0.1 - 1.0 K/uL   Eosinophils Relative 0 %   Eosinophils Absolute 0.0 0.0 - 0.7 K/uL   Basophils Relative 0 %   Basophils Absolute 0.0 0.0 - 0.1 K/uL  Brain natriuretic peptide     Status: Abnormal   Collection Time: 07/22/16  5:49 PM  Result Value Ref Range   B Natriuretic Peptide 887.7 (H) 0.0 - 100.0 pg/mL  Troponin I     Status: None   Collection Time: 07/22/16  5:49 PM  Result Value Ref Range   Troponin I <0.03 <0.03 ng/mL  Protime-INR     Status: None   Collection Time: 07/23/16  8:00 AM  Result Value Ref Range   Prothrombin Time 11.8 11.4 - 15.2 seconds   INR 0.87   Heparin level (unfractionated)     Status: None   Collection Time: 07/23/16  6:42 PM  Result Value Ref Range   Heparin Unfractionated 0.62 0.30 - 0.70 IU/mL    Comment:        IF HEPARIN RESULTS ARE BELOW EXPECTED VALUES, AND PATIENT DOSAGE HAS BEEN CONFIRMED, SUGGEST FOLLOW UP TESTING OF  ANTITHROMBIN III LEVELS.   APTT     Status: None   Collection Time: 07/23/16  6:42 PM  Result Value Ref Range   aPTT 34 24 - 36 seconds  APTT     Status: Abnormal   Collection Time: 07/24/16  2:58 AM  Result Value Ref Range   aPTT 111 (H) 24 - 36 seconds    Comment:        IF BASELINE aPTT IS ELEVATED, SUGGEST PATIENT RISK ASSESSMENT BE USED TO DETERMINE APPROPRIATE ANTICOAGULANT THERAPY.   Heparin level (unfractionated)     Status: Abnormal   Collection Time: 07/24/16  2:58 AM  Result Value Ref Range   Heparin Unfractionated 1.07 (H) 0.30 - 0.70 IU/mL    Comment:        IF HEPARIN RESULTS ARE BELOW EXPECTED VALUES, AND PATIENT DOSAGE HAS BEEN CONFIRMED, SUGGEST FOLLOW UP TESTING OF ANTITHROMBIN III LEVELS.   Troponin I     Status: Abnormal   Collection Time: 07/24/16  6:49 AM  Result Value Ref Range   Troponin I 0.03 (HH) <0.03 ng/mL    Comment: CRITICAL RESULT CALLED TO, READ BACK BY AND VERIFIED WITH: Filbert Schilder [id number] 07/24/16 CLARK,S     Imaging: Dg Thoracic Spine W/swimmers  Result Date: 07/22/2016 CLINICAL DATA:  Mid thoracic back pain for to 3 weeks. Two-view chest x-ray 07/11/2016. EXAM: THORACIC SPINE - 3  VIEWS COMPARISON:  Two-view chest x-ray 07/11/2016. Two-view chest x-ray a 02/27/2016 FINDINGS: The compression fracture at T8 is new since October and appears to have progressed. The T7 compression fracture is suspected as well. Heart is enlarged. A left pleural effusion is again seen. IMPRESSION: 1. Progressive T7 and T8 compression fractures. 2. Cardiomegaly and left pleural effusion. Electronically Signed   By: San Morelle M.D.   On: 07/22/2016 21:02   Mr Thoracic Spine Wo Contrast  Result Date: 07/23/2016 CLINICAL DATA:  83 y/o  F; several weeks of back pain. EXAM: MRI THORACIC SPINE WITHOUT CONTRAST TECHNIQUE: Multiplanar, multisequence MR imaging of the thoracic spine was performed. No intravenous contrast was administered. COMPARISON:  07/22/2016  thoracic spine radiograph FINDINGS: Alignment:  Physiologic. Vertebrae: 50% moderate compression deformity of the T8 vertebral body with edema indicating recent injury. No significant retropulsion. No evidence of discitis, additional acute fracture, or suspicious osseous lesion. Cord:  No abnormal cord signal. Paraspinal and other soft tissues: Left kidney atrophy. Disc levels: Central protrusion at the T7-8 level with anterior cord impingement and mild flattening. No significant canal stenosis. At the T7-8 9 level on the right there is a 7 mm T2 hyperintense structure which is centered just anterior to the right-sided T8-9 facet in the extradural space, probably a synovial cyst. The cyst likely impinges on the exiting T8 nerve root. Mild bilateral degenerative facet edema at T8-9 and on the right at T6-7. IMPRESSION: 1. 50% moderate compression deformity of the T8 vertebral body with edema indicating recent injury. No significant retropulsion. 2. No evidence of discitis, additional acute fracture, or suspicious osseous lesion. 3. Central protrusion at the T7-8 level with anterior cord impingement and mild flattening. 4. At the T7-8 9 level on the right there is a 7 mm T2 hyperintense structure which is centered just anterior to the right-sided T8-9 facet in the extradural space, probably a synovial cyst. The cyst likely impinges on the exiting T8 nerve root. 5. Mild bilateral degenerative facet edema at T8-9 and on the right at T6-7. Electronically Signed   By: Kristine Garbe M.D.   On: 07/23/2016 18:51   Dg Chest Port 1 View  Result Date: 07/24/2016 CLINICAL DATA:  Left-sided chest pain. EXAM: PORTABLE CHEST 1 VIEW COMPARISON:  07/12/2016 FINDINGS: Stable cardiac enlargement and mild chronic lung disease. Stable chronic scarring/atelectasis at the left lung base. There is no evidence of pulmonary edema, consolidation, pneumothorax, nodule or pleural fluid. IMPRESSION: Stable cardiac enlargement and  chronic lung disease. No active disease. Electronically Signed   By: Aletta Edouard M.D.   On: 07/24/2016 09:26   Dg Abd Acute W/chest  Result Date: 07/22/2016 CLINICAL DATA:  Severe back pain, swelling of the legs EXAM: DG ABDOMEN ACUTE W/ 1V CHEST COMPARISON:  Chest x-ray of 07/12/2016 and CT abdomen pelvis of 07/11/2016 FINDINGS: Moderate cardiomegaly is stable. No active infiltrate or effusion is seen. Mild fluid overload cannot be excluded. Supine and erect views the abdomen show a moderate amount of feces throughout the colon. No bowel obstruction is seen. No free air is noted. The bones are osteopenic. IMPRESSION: 1. Cardiomegaly. No active lung disease. Question mild fluid overload. 2. Moderate amount of feces in the colon. No bowel obstruction or free air. Electronically Signed   By: Ivar Drape M.D.   On: 07/22/2016 16:32    Assessment/Plan 1. T7,8 compression fracture Dr. Estanislado Pandy has reviewed the MRI and feels that a T8 KP/VP would be best in this patient's situation.  The patient is NPO.  Her heparin has been stopped at 0800am this morning and her eliquis has been held now for 48hrs.  We will plan to proceed with her procedure today.   Risks and Benefits discussed with the patient including, but not limited to education regarding the natural healing process of compression fractures without intervention, bleeding, infection, cement migration which may cause spinal cord damage, paralysis, pulmonary embolism or even death. All of the patient's questions were answered, patient is agreeable to proceed. Consent signed and in chart.   Thank you for this interesting consult.  I greatly enjoyed meeting Valerie Terrell and look forward to participating in their care.  A copy of this report was sent to the requesting provider on this date.  Electronically Signed: Henreitta Cea 07/24/2016, 10:39 AM   I spent a total of 40 Minutes    in face to face in clinical consultation, greater than 50% of  which was counseling/coordinating care for T8 compression fracture

## 2016-07-24 NOTE — Progress Notes (Signed)
Patient asked for pain medication, on entrance to room pt states it is more chest pain than back pain. Pt states left sided sharp chest pain 7/10. 02 [place] 2L placed, vitals taken: BP 191/63, HR 65, O2 99%, resp 16. On Call paged. EKG obtained. 2mg morphine given. Order for hydralazine x1 if parameters met. Pain decreased, pt resting. Continue to monitor.  

## 2016-07-25 ENCOUNTER — Encounter: Payer: Self-pay | Admitting: Family Medicine

## 2016-07-25 LAB — CBC
HCT: 34.1 % — ABNORMAL LOW (ref 36.0–46.0)
Hemoglobin: 10.7 g/dL — ABNORMAL LOW (ref 12.0–15.0)
MCH: 30 pg (ref 26.0–34.0)
MCHC: 31.4 g/dL (ref 30.0–36.0)
MCV: 95.5 fL (ref 78.0–100.0)
PLATELETS: 224 10*3/uL (ref 150–400)
RBC: 3.57 MIL/uL — ABNORMAL LOW (ref 3.87–5.11)
RDW: 15.2 % (ref 11.5–15.5)
WBC: 11 10*3/uL — ABNORMAL HIGH (ref 4.0–10.5)

## 2016-07-25 LAB — APTT: aPTT: 32 seconds (ref 24–36)

## 2016-07-25 LAB — HEPARIN LEVEL (UNFRACTIONATED): Heparin Unfractionated: 0.38 IU/mL (ref 0.30–0.70)

## 2016-07-25 MED ORDER — LISINOPRIL 10 MG PO TABS
10.0000 mg | ORAL_TABLET | Freq: Every day | ORAL | Status: DC
  Administered 2016-07-25: 10 mg via ORAL
  Filled 2016-07-25: qty 1

## 2016-07-25 MED ORDER — HYDROCODONE-ACETAMINOPHEN 5-325 MG PO TABS
1.0000 | ORAL_TABLET | Freq: Three times a day (TID) | ORAL | Status: DC | PRN
  Administered 2016-07-25: 1 via ORAL
  Filled 2016-07-25: qty 1

## 2016-07-25 MED ORDER — APIXABAN 2.5 MG PO TABS
2.5000 mg | ORAL_TABLET | Freq: Two times a day (BID) | ORAL | Status: DC
  Administered 2016-07-25: 2.5 mg via ORAL
  Filled 2016-07-25: qty 1

## 2016-07-25 MED ORDER — LISINOPRIL 10 MG PO TABS: 10.0000 mg | ORAL_TABLET | Freq: Every day | ORAL | 0 refills | Status: DC

## 2016-07-25 MED ORDER — HYDROCODONE-ACETAMINOPHEN 5-325 MG PO TABS: 1.0000 | ORAL_TABLET | Freq: Three times a day (TID) | ORAL | 0 refills | Status: DC | PRN

## 2016-07-25 NOTE — Care Management Note (Signed)
Case Management Note  Patient Details  Name: Valerie Terrell MRN: [id number] Date of Birth: July 08, 1932  Subjective/Objective:                 Spoke with patent she would like to use [facility] for [facility], referral made to [place], [facility] rep. 3in1 to be delivered to room by [facility] prior to discharge   Action/Plan:  DC to home per MD order with HH/ DME Expected Discharge Date:                  Expected Discharge Plan:  [place]  In-House Referral:  Clinical Social Work  Discharge planning Services  CM Consult  Post Acute Care Choice:  Durable Medical Equipment, Home Health Choice offered to:  Patient  DME Arranged:  3-N-1 DME Agency:  [place]:  PT, OT [facility] Agency:  [place]  Status of Service:  Completed, signed off  If discussed at [place] of Stay Meetings, dates discussed:    Additional Comments:  Carles Collet, RN 07/25/2016, 4:41 PM

## 2016-07-25 NOTE — Progress Notes (Signed)
Referring Physician(s): Nettey,R  Supervising Physician: Luanne Bras  Patient Status:  [facility] - In-pt  Chief Complaint:  Back pain,T8 fracture  Subjective:  Pt feeling better since kyphoplasty yesterday; cont to have some mid to lower back discomfort though but not as severe; no new neuro changes  Allergies: Clonidine derivatives; Codeine; Nickel; and Sulfa antibiotics  Medications: Prior to Admission medications   Medication Sig Start Date End Date Taking? Authorizing Provider  acetaminophen (TYLENOL) 500 MG tablet Take 500 mg by mouth every 6 (six) hours as needed for moderate pain.   Yes Historical Provider, MD  amLODipine (NORVASC) 5 MG tablet Take 1 tablet (5 mg total) by mouth 2 (two) times daily with breakfast and lunch. 05/25/16  Yes Lorretta Harp, MD  apixaban (ELIQUIS) 2.5 MG TABS tablet Take 1 tablet (2.5 mg total) by mouth 2 (two) times daily. 07/20/16  Yes Hosie Poisson, MD  atenolol-chlorthalidone (TENORETIC) 100-25 MG tablet Take 1 tablet by mouth every morning. 01/27/16  Yes Historical Provider, MD  Calcium Carb-Cholecalciferol (CALCIUM/VITAMIN D PO) Take 1 tablet by mouth 4 (four) times a week.   Yes Historical Provider, MD  hydrALAZINE (APRESOLINE) 25 MG tablet Take 3 tablets (75 mg total) by mouth 3 (three) times daily. 07/20/16  Yes Hosie Poisson, MD  HYDROcodone-acetaminophen (NORCO) 5-325 MG tablet Take 1 tablet by mouth every 8 (eight) hours as needed for moderate pain. 07/20/16  Yes Hosie Poisson, MD  Magnesium Gluconate (MAGNESIUM 27) 500 (27 Mg) MG TABS Take 500 mg by mouth 4 (four) times a week.    Yes Historical Provider, MD  methocarbamol (ROBAXIN) 750 MG tablet Take 1 tablet (750 mg total) by mouth every 8 (eight) hours as needed for muscle spasms. 07/20/16  Yes Hosie Poisson, MD  Multiple Vitamin (MULTIVITAMIN) tablet Take 1 tablet by mouth 3 (three) times a week.    Yes Historical Provider, MD  ondansetron (ZOFRAN) 4 MG tablet Take 1 tablet (4 mg total) by  mouth every 8 (eight) hours as needed for nausea or vomiting. Patient taking differently: Take 2-4 mg by mouth every 8 (eight) hours as needed for nausea or vomiting.  07/08/16  Yes Renee A Kuneff, DO  pravastatin (PRAVACHOL) 40 MG tablet Take 40 mg by mouth every evening. 07/16/15  Yes Historical Provider, MD  vitamin C (ASCORBIC ACID) 500 MG tablet Take 500 mg by mouth daily.   Yes Historical Provider, MD     Vital Signs: BP (!) 170/55 (BP Location: Right Arm)   Pulse 73   Temp 98.2 F (36.8 C) (Oral)   Resp 18   Ht 5\' 5"  (1.651 m)   Wt 153 lb 9.6 oz (69.7 kg)   SpO2 90%   BMI 25.56 kg/m   Physical Exam puncture site rt upper back with old blood on gauze, site mildly tender; does have some mid back paravertebral tenderness; moving all fours ok  Imaging: Dg Thoracic Spine W/swimmers  Result Date: 07/22/2016 CLINICAL DATA:  Mid thoracic back pain for to 3 weeks. Two-view chest x-ray 07/11/2016. EXAM: THORACIC SPINE - 3 VIEWS COMPARISON:  Two-view chest x-ray 07/11/2016. Two-view chest x-ray a 02/27/2016 FINDINGS: The compression fracture at T8 is new since October and appears to have progressed. The T7 compression fracture is suspected as well. Heart is enlarged. A left pleural effusion is again seen. IMPRESSION: 1. Progressive T7 and T8 compression fractures. 2. Cardiomegaly and left pleural effusion. Electronically Signed   By: San Morelle M.D.   On: 07/22/2016  21:02   Mr Thoracic Spine Wo Contrast  Result Date: 07/23/2016 CLINICAL DATA:  83 y/o  F; several weeks of back pain. EXAM: MRI THORACIC SPINE WITHOUT CONTRAST TECHNIQUE: Multiplanar, multisequence MR imaging of the thoracic spine was performed. No intravenous contrast was administered. COMPARISON:  07/22/2016 thoracic spine radiograph FINDINGS: Alignment:  Physiologic. Vertebrae: 50% moderate compression deformity of the T8 vertebral body with edema indicating recent injury. No significant retropulsion. No evidence of  discitis, additional acute fracture, or suspicious osseous lesion. Cord:  No abnormal cord signal. Paraspinal and other soft tissues: Left kidney atrophy. Disc levels: Central protrusion at the T7-8 level with anterior cord impingement and mild flattening. No significant canal stenosis. At the T7-8 9 level on the right there is a 7 mm T2 hyperintense structure which is centered just anterior to the right-sided T8-9 facet in the extradural space, probably a synovial cyst. The cyst likely impinges on the exiting T8 nerve root. Mild bilateral degenerative facet edema at T8-9 and on the right at T6-7. IMPRESSION: 1. 50% moderate compression deformity of the T8 vertebral body with edema indicating recent injury. No significant retropulsion. 2. No evidence of discitis, additional acute fracture, or suspicious osseous lesion. 3. Central protrusion at the T7-8 level with anterior cord impingement and mild flattening. 4. At the T7-8 9 level on the right there is a 7 mm T2 hyperintense structure which is centered just anterior to the right-sided T8-9 facet in the extradural space, probably a synovial cyst. The cyst likely impinges on the exiting T8 nerve root. 5. Mild bilateral degenerative facet edema at T8-9 and on the right at T6-7. Electronically Signed   By: Kristine Garbe M.D.   On: 07/23/2016 18:51   Dg Chest Port 1 View  Result Date: 07/24/2016 CLINICAL DATA:  Left-sided chest pain. EXAM: PORTABLE CHEST 1 VIEW COMPARISON:  07/12/2016 FINDINGS: Stable cardiac enlargement and mild chronic lung disease. Stable chronic scarring/atelectasis at the left lung base. There is no evidence of pulmonary edema, consolidation, pneumothorax, nodule or pleural fluid. IMPRESSION: Stable cardiac enlargement and chronic lung disease. No active disease. Electronically Signed   By: Aletta Edouard M.D.   On: 07/24/2016 09:26   Dg Abd Acute W/chest  Result Date: 07/22/2016 CLINICAL DATA:  Severe back pain, swelling of the  legs EXAM: DG ABDOMEN ACUTE W/ 1V CHEST COMPARISON:  Chest x-ray of 07/12/2016 and CT abdomen pelvis of 07/11/2016 FINDINGS: Moderate cardiomegaly is stable. No active infiltrate or effusion is seen. Mild fluid overload cannot be excluded. Supine and erect views the abdomen show a moderate amount of feces throughout the colon. No bowel obstruction is seen. No free air is noted. The bones are osteopenic. IMPRESSION: 1. Cardiomegaly. No active lung disease. Question mild fluid overload. 2. Moderate amount of feces in the colon. No bowel obstruction or free air. Electronically Signed   By: Ivar Drape M.D.   On: 07/22/2016 16:32    Labs:  CBC:  Recent Labs  07/17/16 0513 07/18/16 0549 07/22/16 1749 07/25/16 0443  WBC 5.9 3.6* 9.6 11.0*  HGB 10.4* 10.8* 11.4* 10.7*  HCT 31.8* 33.9* 35.4* 34.1*  PLT 234 252 299 224    COAGS:  Recent Labs  01/28/16 1629 02/02/16 0939 07/11/16 0853  07/18/16 0549 07/23/16 0800 07/23/16 1842 07/24/16 0258 07/25/16 0443  INR 0.91 1.27 1.35  --   --  0.87  --   --   --   APTT 36 45*  --   < > 73*  --  34 111* 32  < > = values in this interval not displayed.  BMP:  Recent Labs  07/18/16 0549 07/19/16 0510 07/20/16 0530 07/22/16 1749  NA 135 134* 134* 134*  K 4.4 5.3* 4.5 4.1  CL 102 103 105 99*  CO2 22 24 23 26   GLUCOSE 125* 117* 98 117*  BUN 50* 50* 50* 43*  CALCIUM 9.0 9.3 8.7* 8.7*  CREATININE 2.11* 2.00* 1.92* 1.59*  GFRNONAA 21* 22* 23* 29*  GFRAA 24* 26* 27* 34*    LIVER FUNCTION TESTS:  Recent Labs  02/02/16 0939 06/17/16 0819 07/11/16 0853 07/22/16 1749  BILITOT 0.8 0.2 0.5 0.8  AST 42* 25 25 35  ALT 28 17 17 27   ALKPHOS 39 61 57 48  PROT 6.8 6.1 5.9* 5.3*  ALBUMIN 3.6 3.7 2.7* 2.8*    Assessment and Plan: Back pain,T8 fracture; s/p T8 kyphoplasty 3/9; currently stable;  AF; WBC 11.0, hgb 10.7; consider physical therapy; monitor for worsening pain- if persistent consider f/u imaging ,preferably MRI as pt is at risk  for future new fractures   Electronically Signed: D. Rowe Robert 07/25/2016, 12:47 PM   I spent a total of 15 minutes at the the patient's bedside AND on the patient's hospital floor or unit, greater than 50% of which was counseling/coordinating care for T8 kyphoplasty    [name], female   DOB: [birth year], 83 y.o.   MRN: [id number]

## 2016-07-25 NOTE — Evaluation (Signed)
Physical Therapy Evaluation Patient Details Name: Valerie Terrell MRN: [id number] DOB: 1932/06/25 Today's Date: 07/25/2016   History of Present Illness  83 y.o. female admitted with Back pain,T8 fracture; s/p T8 kyphoplasty 3/9.  history significant of solitary kidney, CKD, and CVA.  Clinical Impression  Pt admitted with above complications and is seen following her T8 kyphoplasty. Pt currently with functional limitations due to the deficits listed below (see PT Problem List). Demonstrates safe ability to walk with a rolling walker. Min assist for bed mobility and stair navigation. She has a daughter that will apparently stay with her 24/7 and can assist pt with ADLs. May benefit from short term HH initially. Pt will benefit from skilled PT to increase their independence and safety with mobility to allow discharge to the venue listed below.       Follow Up Recommendations Home health PT;Supervision for mobility/OOB    Equipment Recommendations  3in1 (PT)    Recommendations for Other Services OT consult     Precautions / Restrictions Precautions Precautions: Back;Fall Precaution Booklet Issued: Yes (comment) Precaution Comments: Educated on precautions and safety with mobility Restrictions Weight Bearing Restrictions: No      Mobility  Bed Mobility Overal bed mobility: Needs Assistance Bed Mobility: Rolling;Sidelying to Sit Rolling: Min assist Sidelying to sit: Min assist       General bed mobility comments: Min assist with cues for log roll and for pt to pull through PTs hand to roll onto Lt side. Min assist for trunk support to rise from bed. Cues for back position to avoid flexion.  Transfers Overall transfer level: Needs assistance Equipment used: Rolling walker (2 wheeled) Transfers: Sit to/from Stand Sit to Stand: Min guard         General transfer comment: Min guard for safety. VC for technique and to maintain neutral spine  posture.  Ambulation/Gait Ambulation/Gait assistance: Min guard Ambulation Distance (Feet): 75 Feet Assistive device: Rolling walker (2 wheeled) Gait Pattern/deviations: Step-through pattern;Decreased stride length;Shuffle Gait velocity: slow Gait velocity interpretation: Below normal speed for age/gender General Gait Details: Educated on safe DME use with a walker. VC for upright posture and larger step with foot clearance. No buckling or overt loss of balance noted with walker for support.  Stairs Stairs: Yes Stairs assistance: Min assist Stair Management: No rails;Backwards;Forwards;With walker Number of Stairs: 4 General stair comments: Min assist with hand held support to navigate steps forward, demonstrating decreased LE strength for clearance at times. With posterior approach using RW pt performed much better. Min assist to stabilize RW only. Cues for sequencing and handout provided for technique.  Wheelchair Mobility    Modified Rankin (Stroke Patients Only)       Balance Overall balance assessment: Needs assistance Sitting-balance support: No upper extremity supported;Feet supported Sitting balance-Leahy Scale: Good     Standing balance support: No upper extremity supported Standing balance-Leahy Scale: Fair                               Pertinent Vitals/Pain Pain Assessment: Faces Faces Pain Scale: Hurts little more Pain Location: Back Pain Descriptors / Indicators: Sore Pain Intervention(s): Monitored during session;Repositioned    Home Living Family/patient expects to be discharged to:: Private residence Living Arrangements: Children Available Help at Discharge: Family;Available 24 hours/day Type of Home: House Home Access: Stairs to enter Entrance Stairs-Rails: None Entrance Stairs-Number of Steps: 3-4 Home Layout: One level Home Equipment: Walker - 2 wheels;Cane - single  point      Prior Function Level of Independence: Independent                Hand Dominance   Dominant Hand: Right    Extremity/Trunk Assessment   Upper Extremity Assessment Upper Extremity Assessment: Defer to OT evaluation    Lower Extremity Assessment Lower Extremity Assessment: Generalized weakness    Cervical / Trunk Assessment Cervical / Trunk Assessment: Normal  Communication   Communication: No difficulties  Cognition Arousal/Alertness: Awake/alert Behavior During Therapy: WFL for tasks assessed/performed Overall Cognitive Status: Within Functional Limits for tasks assessed                      General Comments General comments (skin integrity, edema, etc.): Education for posture, safety with mobility at homd.    Exercises     Assessment/Plan    PT Assessment Patient needs continued PT services  PT Problem List Decreased strength;Decreased range of motion;Decreased activity tolerance;Decreased balance;Decreased mobility;Decreased knowledge of use of DME;Decreased knowledge of precautions;Pain       PT Treatment Interventions DME instruction;Gait training;Stair training;Functional mobility training;Therapeutic activities;Therapeutic exercise;Neuromuscular re-education;Balance training;Modalities;Patient/family education    PT Goals (Current goals can be found in the Care Plan section)  Acute Rehab PT Goals Patient Stated Goal: Feel better PT Goal Formulation: With patient Time For Goal Achievement: 08/08/16 Potential to Achieve Goals: Good    Frequency Min 5X/week   Barriers to discharge Inaccessible home environment steps without rail    Co-evaluation               End of Session Equipment Utilized During Treatment: Gait belt Activity Tolerance: Patient tolerated treatment well Patient left: in chair;with call bell/phone within reach;with chair alarm set;with nursing/sitter in room Nurse Communication: Mobility status PT Visit Diagnosis: Muscle weakness (generalized) (M62.81);Other abnormalities  of gait and mobility (R26.89);Pain Pain - part of body:  (Back)         Time: 8250-5397 PT Time Calculation (min) (ACUTE ONLY): 21 min   Charges:   PT Evaluation $PT Eval Moderate Complexity: 1 Procedure     PT G CodesEllouise Newer 07/25/2016, 2:14 PM Camille Bal [place], [place]

## 2016-07-25 NOTE — Progress Notes (Signed)
Patient states her pain is well controlled, states her pain is "3-4 out of 10" states this is manageable for her. Case management notified of hh orders and hh equipment orders.

## 2016-07-25 NOTE — Discharge Summary (Addendum)
Physician Discharge Summary  Valerie Terrell MVH:[id number] DOB: 1932/08/05 DOA: 07/22/2016  PCP: Valerie Sou, MD  Admit date: 07/22/2016 Discharge date: 07/25/2016  Admitted From: Home Disposition: Home  Recommendations for Outpatient Follow-up:  1. Follow up with PCP in 1 week 2. Follow up with interventional radiology  Home Health: Physical therapy Equipment/Devices: 3-in-1  Discharge Condition: Stable CODE STATUS: Full code    Brief/Interim Summary:  Admission HPI written by Valerie Kettle, DO   HPI: Valerie Terrell is a 83 y.o. female with medical history significant of solitary kidney, CKD.  Patient was just discharged from our service 2 days ago for AKI on CKD and severe back pain.  AKI has resolved and creatinine continues to improve even today (now down to 1.5 compared to 1.9 on discharge and 4.x on admission).  Her severe back pain however has been uncontrollable at home even with robaxin and Vicodin.  Last admit work up for severe back pain included MRI L spine which wasn't really impressive.  On discharge they thought that pain was likely musculoskeletal.  ED Course: Today work up includes plain film X ray of T spine, which reveals T7 and T8 progressive compression fractures that appear new since CXR done in October!    Hospital course:  Thoracic compression fracture T7 and T8 compression fractures. Interventional radiology consulted and performed kyphoplasty on 07/25/2016.  Patient achieved good pain control after the procedure.   Paroxysmal atrial fibrillation Asymptomatic. On Eliquis which was held for the procedure above. Restarted at discharge.  Essential hypertension Uncontrolled likely secondary to pain. Continued atenolol, chlorthalidone and amlodipine  CKD stage III Solitary kidney Stable. Outpatient follow-up  Osteoporosis History of osteopenia now with a compression fracture. Consider bisphosphonate therapy as an outpatient. Continue vitamin  D and calcium. Could consider calcitonin if she still has bone pain rather than using narcotics.   Discharge Diagnoses:  Principal Problem:   Thoracic compression fracture (HCC) Active Problems:   PAF (paroxysmal atrial fibrillation) (HCC)   Essential hypertension   CKD (chronic kidney disease), stage II   Compression fracture of body of thoracic vertebra [facility])    Discharge Instructions  Discharge Instructions    Call MD for:  difficulty breathing, headache or visual disturbances    Complete by:  As directed    Call MD for:  persistant nausea and vomiting    Complete by:  As directed    Call MD for:  redness, tenderness, or signs of infection (pain, swelling, redness, odor or green/yellow discharge around incision site)    Complete by:  As directed    Call MD for:  severe uncontrolled pain    Complete by:  As directed    Call MD for:  temperature >100.4    Complete by:  As directed      Allergies as of 07/25/2016      Reactions   Clonidine Derivatives Palpitations, Other (See Comments)   Very sedated   Codeine Nausea Only   Nickel Rash   Sulfa Antibiotics Other (See Comments)   Reaction unknown      Medication List    TAKE these medications   acetaminophen 500 MG tablet Commonly known as:  TYLENOL Take 500 mg by mouth every 6 (six) hours as needed for moderate pain.   amLODipine 5 MG tablet Commonly known as:  NORVASC Take 1 tablet (5 mg total) by mouth 2 (two) times daily with breakfast and lunch.   apixaban 2.5 MG Tabs tablet Commonly known as:  ELIQUIS  Take 1 tablet (2.5 mg total) by mouth 2 (two) times daily.   atenolol-chlorthalidone 100-25 MG tablet Commonly known as:  TENORETIC Take 1 tablet by mouth every morning.   CALCIUM/VITAMIN D PO Take 1 tablet by mouth 4 (four) times a week.   hydrALAZINE 25 MG tablet Commonly known as:  APRESOLINE Take 3 tablets (75 mg total) by mouth 3 (three) times daily.   HYDROcodone-acetaminophen 5-325 MG  tablet Commonly known as:  NORCO Take 1 tablet by mouth every 8 (eight) hours as needed for moderate pain.   lisinopril 10 MG tablet Commonly known as:  PRINIVIL,ZESTRIL Take 1 tablet (10 mg total) by mouth daily. Start taking on:  07/26/2016   Magnesium 27 500 (27 Mg) MG Tabs Take 500 mg by mouth 4 (four) times a week.   methocarbamol 750 MG tablet Commonly known as:  ROBAXIN Take 1 tablet (750 mg total) by mouth every 8 (eight) hours as needed for muscle spasms.   multivitamin tablet Take 1 tablet by mouth 3 (three) times a week.   ondansetron 4 MG tablet Commonly known as:  ZOFRAN Take 1 tablet (4 mg total) by mouth every 8 (eight) hours as needed for nausea or vomiting. What changed:  how much to take   pravastatin 40 MG tablet Commonly known as:  PRAVACHOL Take 40 mg by mouth every evening.   vitamin C 500 MG tablet Commonly known as:  ASCORBIC ACID Take 500 mg by mouth daily.            Durable Medical Equipment        Start     Ordered   07/25/16 1640  For home use only DME 3 n 1  Once     07/25/16 1639   07/25/16 1616  For home use only DME 3 n 1  Once     07/25/16 1615     Follow-up Information    Valerie Terrell Follow up.   Why:  home health agency and provider of [facility]  Contact information: [address] [place] [address] [phone]        Valerie Sou, MD. Schedule an appointment as soon as possible for a visit in 1 week(s).   Specialty:  Family Medicine Contact information: [address] [place] [address] [phone]          Allergies  Allergen Reactions  . Clonidine Derivatives Palpitations and Other (See Comments)    Very sedated  . Codeine Nausea Only  . Nickel Rash  . Sulfa Antibiotics Other (See Comments)    Reaction unknown    Consultations:  Interventional radiology   Procedures/Studies: Ct Abdomen Pelvis Wo Contrast  Result Date: 07/11/2016 CLINICAL DATA:  Back pain,  right CVA pain, history of stones. EXAM: CT ABDOMEN AND PELVIS WITHOUT CONTRAST TECHNIQUE: Multidetector CT imaging of the abdomen and pelvis was performed following the standard protocol without IV contrast. COMPARISON:  None. FINDINGS: Lower chest: Small left pleural effusion and compressive atelectasis in the left lower lobe. Heart is enlarged. Coronary artery calcification. Small amount of pericardial fluid may be physiologic. Distal esophagus is grossly unremarkable. Hepatobiliary: Faint 11 mm low-attenuation lesion in the dome of the liver is too small to characterize. Liver is otherwise unremarkable. There may be sludge in the gallbladder. No biliary ductal dilatation. Pancreas: Negative. Spleen: Negative. Adrenals/Urinary Tract: Adrenal glands are unremarkable. 1.5 cm low-attenuation lesion in the right kidney is difficult to further characterize without IV contrast. There  may be a punctate stone in a markedly atrophic left kidney. Ureters are decompressed. Bladder is low in volume. Stomach/Bowel: Stomach, small bowel and colon are unremarkable. Appendix is not readily visualized. Vascular/Lymphatic: Atherosclerotic calcification of the arterial vasculature without abdominal aortic aneurysm. Porta hepatis, retroperitoneal and mesenteric lymph nodes measure up to 10 mm. Reproductive: No adnexal mass. Other: Small periumbilical and bilateral inguinal hernias contain fat. Musculoskeletal: No worrisome lytic or sclerotic lesions. IMPRESSION: 1. No findings to explain the patient's symptoms. 2. Tiny left pleural effusion with compressive atelectasis left lower lobe. 3. Borderline porta hepatis, retroperitoneal and mesenteric lymph nodes, nonspecific. 4.  Aortic atherosclerosis (ICD10-170.0). 5. Possible gallbladder sludge. 6. Possible punctate stone in an atrophic left kidney. Electronically Signed   By: Lorin Picket M.D.   On: 07/11/2016 12:02   Dg Chest 2 View  Result Date: 07/11/2016 CLINICAL DATA:   83 year old female with a 2 day history of pleuritic back pain, weakness and shortness of breath EXAM: CHEST  2 VIEW COMPARISON:  Prior chest x-ray 02/27/2016 FINDINGS: Stable cardiomegaly. Atherosclerotic calcifications are present in the transverse aorta. Mild pulmonary vascular congestion without overt edema. Interval development of a small layering left pleural effusion and associated left basilar opacity which is favored to reflect atelectasis. Atherosclerotic calcifications are present in the transverse aorta. No acute osseous abnormality. IMPRESSION: 1. New development of a small left layering pleural effusion with associated left basilar opacity likely reflecting atelectasis. 2. Stable cardiomegaly. 3. Mild vascular congestion without overt edema. 4.  Aortic Atherosclerosis (ICD10-170.0). Electronically Signed   By: Jacqulynn Cadet M.D.   On: 07/11/2016 09:33   Dg Thoracic Spine W/swimmers  Result Date: 07/22/2016 CLINICAL DATA:  Mid thoracic back pain for to 3 weeks. Two-view chest x-ray 07/11/2016. EXAM: THORACIC SPINE - 3 VIEWS COMPARISON:  Two-view chest x-ray 07/11/2016. Two-view chest x-ray a 02/27/2016 FINDINGS: The compression fracture at T8 is new since October and appears to have progressed. The T7 compression fracture is suspected as well. Heart is enlarged. A left pleural effusion is again seen. IMPRESSION: 1. Progressive T7 and T8 compression fractures. 2. Cardiomegaly and left pleural effusion. Electronically Signed   By: San Morelle M.D.   On: 07/22/2016 21:02   Dg Lumbar Spine Complete  Result Date: 07/15/2016 CLINICAL DATA:  Right lower back pain for 6 days. EXAM: LUMBAR SPINE - COMPLETE 4+ VIEW COMPARISON:  CT abdomen and pelvis 07/11/2016 FINDINGS: There are 5 non rib bearing lumbar type vertebrae. A rudimentary disc is present at S1-2. Vertebral alignment is normal. Vertebral body heights are preserved without evidence of fracture. Moderate disc space narrowing is  present at L5-S1 with degenerative endplate spurring and evidence of vacuum disc phenomenon. Mild endplate spurring is present more proximally in the lumbar spine without significant disc space height loss. No destructive osseous lesion is identified. Extensive abdominal aortic calcified atherosclerosis is noted. IMPRESSION: 1. Lumbar disc degeneration greatest at L5-S1. No evidence acute osseous abnormality. 2. Aortic atherosclerosis. Electronically Signed   By: Logan Bores M.D.   On: 07/15/2016 16:00   Mr Thoracic Spine Wo Contrast  Result Date: 07/23/2016 CLINICAL DATA:  83 y/o  F; several weeks of back pain. EXAM: MRI THORACIC SPINE WITHOUT CONTRAST TECHNIQUE: Multiplanar, multisequence MR imaging of the thoracic spine was performed. No intravenous contrast was administered. COMPARISON:  07/22/2016 thoracic spine radiograph FINDINGS: Alignment:  Physiologic. Vertebrae: 50% moderate compression deformity of the T8 vertebral body with edema indicating recent injury. No significant retropulsion. No evidence of discitis, additional  acute fracture, or suspicious osseous lesion. Cord:  No abnormal cord signal. Paraspinal and other soft tissues: Left kidney atrophy. Disc levels: Central protrusion at the T7-8 level with anterior cord impingement and mild flattening. No significant canal stenosis. At the T7-8 9 level on the right there is a 7 mm T2 hyperintense structure which is centered just anterior to the right-sided T8-9 facet in the extradural space, probably a synovial cyst. The cyst likely impinges on the exiting T8 nerve root. Mild bilateral degenerative facet edema at T8-9 and on the right at T6-7. IMPRESSION: 1. 50% moderate compression deformity of the T8 vertebral body with edema indicating recent injury. No significant retropulsion. 2. No evidence of discitis, additional acute fracture, or suspicious osseous lesion. 3. Central protrusion at the T7-8 level with anterior cord impingement and mild  flattening. 4. At the T7-8 9 level on the right there is a 7 mm T2 hyperintense structure which is centered just anterior to the right-sided T8-9 facet in the extradural space, probably a synovial cyst. The cyst likely impinges on the exiting T8 nerve root. 5. Mild bilateral degenerative facet edema at T8-9 and on the right at T6-7. Electronically Signed   By: Kristine Garbe M.D.   On: 07/23/2016 18:51   Mr Lumbar Spine Wo Contrast  Result Date: 07/16/2016 CLINICAL DATA:  Low back pain over the past couple of weeks which has worsened in the past 2 days. No known injury. EXAM: MRI LUMBAR SPINE WITHOUT CONTRAST TECHNIQUE: Multiplanar, multisequence MR imaging of the lumbar spine was performed. No intravenous contrast was administered. COMPARISON:  Plain films lumbar spine 07/15/2016. CT abdomen and pelvis 07/11/2016. FINDINGS: Segmentation: Standard. Rudimentary disc material at S1-2 incidentally noted. Alignment:  Maintained. Vertebrae:  No fracture.  Marrow signal is somewhat heterogeneous. Conus medullaris: Extends to the L3-4 Level and appears normal. Paraspinal and other soft tissues: T2 hyperintense lesion right kidney noted. The left kidney is markedly atrophic. Disc levels: T11-12 and T12-L1 are imaged in the sagittal plane only and negative. L1-2:  Negative. L2-3:  Negative. L3-4: There is some ligamentum flavum thickening and mild facet degenerative change. Very small left foraminal protrusion is seen. The central canal and foramina are open. L4-5: Mild facet degenerative change and ligamentum flavum thickening. The central canal and foramina are open. L5-S1: Shallow central protrusion without central canal or foraminal stenosis. IMPRESSION: Negative for fracture or other acute abnormality. Mild degenerative disease as described above. Electronically Signed   By: Inge Rise M.D.   On: 07/16/2016 12:44   US Renal  Result Date: 07/11/2016 CLINICAL DATA:  Right flank pain history of  chronic renal insufficiency EXAM: RENAL / URINARY TRACT ULTRASOUND COMPLETE COMPARISON:  CT 07/11/2016, renal ultrasound 01/29/2016 FINDINGS: Right Kidney: Length: 11.2 cm. Echogenicity within normal limits. No hydronephrosis. 1.9 x 1.5 x 1.7 cm cyst mid right kidney. Left Kidney: Length: Not clearly visualized. Bladder: Poorly visualized, and is likely empty. Incidental note made of fatty liver. IMPRESSION: 1. 1.9 cm cyst in the right kidney.  No right hydronephrosis 2. Left kidney is not clearly visualized, this may be secondary to marked atrophy. 3. Bladder not well visualized and is likely empty. Electronically Signed   By: Donavan Foil M.D.   On: 07/11/2016 18:21   Dg Chest Port 1 View  Result Date: 07/24/2016 CLINICAL DATA:  Left-sided chest pain. EXAM: PORTABLE CHEST 1 VIEW COMPARISON:  07/12/2016 FINDINGS: Stable cardiac enlargement and mild chronic lung disease. Stable chronic scarring/atelectasis at the left lung base.  There is no evidence of pulmonary edema, consolidation, pneumothorax, nodule or pleural fluid. IMPRESSION: Stable cardiac enlargement and chronic lung disease. No active disease. Electronically Signed   By: Aletta Edouard M.D.   On: 07/24/2016 09:26   Dg Chest Port 1 View  Result Date: 07/12/2016 CLINICAL DATA:  Fluid retention and legs. EXAM: PORTABLE CHEST 1 VIEW COMPARISON:  07/11/2016 FINDINGS: There is moderate cardiac enlargement. Aortic atherosclerosis noted. Small to moderate left pleural effusion is again identified. No airspace opacities. IMPRESSION: Similar appearance of cardiac enlargement, aortic atherosclerosis and left pleural effusion. Electronically Signed   By: Kerby Moors M.D.   On: 07/12/2016 18:43   Dg Abd Acute W/chest  Result Date: 07/22/2016 CLINICAL DATA:  Severe back pain, swelling of the legs EXAM: DG ABDOMEN ACUTE W/ 1V CHEST COMPARISON:  Chest x-ray of 07/12/2016 and CT abdomen pelvis of 07/11/2016 FINDINGS: Moderate cardiomegaly is stable. No  active infiltrate or effusion is seen. Mild fluid overload cannot be excluded. Supine and erect views the abdomen show a moderate amount of feces throughout the colon. No bowel obstruction is seen. No free air is noted. The bones are osteopenic. IMPRESSION: 1. Cardiomegaly. No active lung disease. Question mild fluid overload. 2. Moderate amount of feces in the colon. No bowel obstruction or free air. Electronically Signed   By: Ivar Drape M.D.   On: 07/22/2016 16:32       Subjective: Patient reports mild pain today. No nausea or vomiting.  Discharge Exam: Vitals:   07/25/16 1357 07/25/16 1554  BP: (!) 171/65 (!) 152/56  Pulse:    Resp: 16   Temp: 98.3 F (36.8 C)    Vitals:   07/25/16 0910 07/25/16 1018 07/25/16 1357 07/25/16 1554  BP:  (!) 170/55 (!) 171/65 (!) 152/56  Pulse:  73    Resp:  18 16   Temp:  98.2 F (36.8 C) 98.3 F (36.8 C)   TempSrc:  Oral Oral   SpO2: 97% 90% 93%   Weight:      Height:        General exam: Appears calm and comfortable Respiratory system: Clear to auscultation. Respiratory effort normal. Cardiovascular system: S1 & S2 heard, RRR. No murmurs Gastrointestinal system: Abdomen is nondistended, soft and nontender. Normal bowel sounds heard. Central nervous system: Alert and oriented. No focal neurological deficits. Musculoskeletal: 2+ pitting edema. No calf tenderness. Mild thoracic spine tenderness Skin: No cyanosis. No rashes Psychiatry: Judgement and insight appear normal. Mood & affect appropriate.   The results of significant diagnostics from this hospitalization (including imaging, microbiology, ancillary and laboratory) are listed below for reference.     Microbiology: No results found for this or any previous visit (from the past 240 hour(s)).   Labs: BNP (last 3 results)  Recent Labs  07/12/16 1707 07/13/16 0144 07/22/16 1749  BNP 857.9* 926.6* 063.0*   Basic Metabolic Panel:  Recent Labs Lab 07/19/16 0510  07/20/16 0530 07/22/16 1749  NA 134* 134* 134*  K 5.3* 4.5 4.1  CL 103 105 99*  CO2 24 23 26   GLUCOSE 117* 98 117*  BUN 50* 50* 43*  CREATININE 2.00* 1.92* 1.59*  CALCIUM 9.3 8.7* 8.7*   Liver Function Tests:  Recent Labs Lab 07/22/16 1749  AST 35  ALT 27  ALKPHOS 48  BILITOT 0.8  PROT 5.3*  ALBUMIN 2.8*   No results for input(s): LIPASE, AMYLASE in the last 168 hours. No results for input(s): AMMONIA in the last 168 hours. CBC:  Recent Labs Lab 07/22/16 1749 07/25/16 0443  WBC 9.6 11.0*  NEUTROABS 7.8*  --   HGB 11.4* 10.7*  HCT 35.4* 34.1*  MCV 92.7 95.5  PLT 299 224   Cardiac Enzymes:  Recent Labs Lab 07/22/16 1749 07/24/16 0649  TROPONINI <0.03 0.03*   BNP: Invalid input(s): POCBNP CBG: No results for input(s): GLUCAP in the last 168 hours. D-Dimer No results for input(s): DDIMER in the last 72 hours. Hgb A1c No results for input(s): HGBA1C in the last 72 hours. Lipid Profile No results for input(s): CHOL, HDL, LDLCALC, TRIG, CHOLHDL, LDLDIRECT in the last 72 hours. Thyroid function studies No results for input(s): TSH, T4TOTAL, T3FREE, THYROIDAB in the last 72 hours.  Invalid input(s): FREET3 Anemia work up No results for input(s): VITAMINB12, FOLATE, FERRITIN, TIBC, IRON, RETICCTPCT in the last 72 hours. Urinalysis    Component Value Date/Time   COLORURINE YELLOW 07/22/2016 1500   APPEARANCEUR CLEAR 07/22/2016 1500   LABSPEC 1.016 07/22/2016 1500   PHURINE 5.0 07/22/2016 1500   GLUCOSEU NEGATIVE 07/22/2016 1500   HGBUR NEGATIVE 07/22/2016 1500   BILIRUBINUR NEGATIVE 07/22/2016 1500   KETONESUR NEGATIVE 07/22/2016 1500   PROTEINUR NEGATIVE 07/22/2016 1500   NITRITE NEGATIVE 07/22/2016 1500   LEUKOCYTESUR SMALL (A) 07/22/2016 1500   Sepsis Labs Invalid input(s): PROCALCITONIN,  WBC,  LACTICIDVEN Microbiology No results found for this or any previous visit (from the past 240 hour(s)).   Time coordinating discharge: Over 30  minutes  SIGNED:   Cordelia Poche, MD [facility] 07/25/2016, 5:51 PM Pager [phone]  If 7PM-7AM, please contact night-coverage [email or web address] Password [email or web address]

## 2016-07-25 NOTE — Progress Notes (Signed)
RN discussed discharge instructions with patient and her daughter. They vocalized understanding of discharge instructions including vicodin rx, wound care, back precautions. Understands to follow up with dr. Kathee Delton, states they have his number and were told to follow up in 2 weeks, will f/u with pcp in 1 week. Prescriptions and instructions given to patient. Patient taken to car by wheelchair, bsc put in car. Neuro assessment unchanged.

## 2016-07-27 ENCOUNTER — Telehealth: Payer: Self-pay | Admitting: Family Medicine

## 2016-07-27 ENCOUNTER — Ambulatory Visit: Payer: Medicare Other | Admitting: Family Medicine

## 2016-07-27 ENCOUNTER — Encounter (HOSPITAL_COMMUNITY): Payer: Self-pay | Admitting: Interventional Radiology

## 2016-07-27 HISTORY — PX: KYPHOPLASTY: SHX5884

## 2016-07-27 NOTE — Telephone Encounter (Signed)
Please call patient's daughter at [phone] to reschedule mother's hospital f/u (cancelled today due to weather). Patient's daughter [name] is requesting mother be seen as soon as possible (requres 30 min office visit).

## 2016-07-27 NOTE — Telephone Encounter (Signed)
Please call pt to reschedule. Thanks.  

## 2016-07-28 ENCOUNTER — Other Ambulatory Visit (HOSPITAL_COMMUNITY): Payer: Self-pay | Admitting: Interventional Radiology

## 2016-07-28 DIAGNOSIS — E785 Hyperlipidemia, unspecified: Secondary | ICD-10-CM | POA: Diagnosis not present

## 2016-07-28 DIAGNOSIS — M4850XA Collapsed vertebra, not elsewhere classified, site unspecified, initial encounter for fracture: Principal | ICD-10-CM

## 2016-07-28 DIAGNOSIS — N182 Chronic kidney disease, stage 2 (mild): Secondary | ICD-10-CM | POA: Diagnosis not present

## 2016-07-28 DIAGNOSIS — S22060D Wedge compression fracture of T7-T8 vertebra, subsequent encounter for fracture with routine healing: Secondary | ICD-10-CM | POA: Diagnosis not present

## 2016-07-28 DIAGNOSIS — I480 Paroxysmal atrial fibrillation: Secondary | ICD-10-CM | POA: Diagnosis not present

## 2016-07-28 DIAGNOSIS — I129 Hypertensive chronic kidney disease with stage 1 through stage 4 chronic kidney disease, or unspecified chronic kidney disease: Secondary | ICD-10-CM | POA: Diagnosis not present

## 2016-07-28 DIAGNOSIS — M810 Age-related osteoporosis without current pathological fracture: Secondary | ICD-10-CM

## 2016-07-28 DIAGNOSIS — IMO0001 Reserved for inherently not codable concepts without codable children: Secondary | ICD-10-CM

## 2016-07-28 DIAGNOSIS — Z7901 Long term (current) use of anticoagulants: Secondary | ICD-10-CM | POA: Diagnosis not present

## 2016-07-28 NOTE — Telephone Encounter (Signed)
First available 30 minute appointment is March 28th. Please advise.

## 2016-07-28 NOTE — Telephone Encounter (Signed)
SW pts daughter, apt made for 07/30/16 at 3:30pm.

## 2016-07-29 DIAGNOSIS — I10 Essential (primary) hypertension: Secondary | ICD-10-CM | POA: Diagnosis not present

## 2016-07-29 DIAGNOSIS — E8770 Fluid overload, unspecified: Secondary | ICD-10-CM | POA: Diagnosis not present

## 2016-07-29 DIAGNOSIS — I609 Nontraumatic subarachnoid hemorrhage, unspecified: Secondary | ICD-10-CM | POA: Diagnosis not present

## 2016-07-29 DIAGNOSIS — N182 Chronic kidney disease, stage 2 (mild): Secondary | ICD-10-CM | POA: Diagnosis not present

## 2016-07-29 DIAGNOSIS — N179 Acute kidney failure, unspecified: Secondary | ICD-10-CM | POA: Diagnosis not present

## 2016-07-29 DIAGNOSIS — M810 Age-related osteoporosis without current pathological fracture: Secondary | ICD-10-CM | POA: Diagnosis not present

## 2016-07-29 LAB — VITAMIN D 25 HYDROXY (VIT D DEFICIENCY, FRACTURES): Vit D, 25-Hydroxy: 26.6

## 2016-07-29 NOTE — Progress Notes (Signed)
07/30/2016  CC:  Chief Complaint  Patient presents with  . Hospitalization Follow-up    TCM    Patient is a 83 y.o.  female who presents for  hospital follow up, specifically [name] face-to-face visit. Dates hospitalized: 3/7-3/10, 2018. Days since d/c from hospital: 4 Patient was discharged from hospital to home. Reason for admission to hospital: back pain, found to have new compression fractures at T7 and T8, got kyphoplasty done on T8 by IR. She has osteoporosis, and now with her compression fractures it is recommended she start bisphosphonate, possibly calcitonin if bone pain ongoing.  She saw Dr. Florene Glen, nephrologist, yesterday and he stopped her and added 80mg  lasix bid for her extreme LE edema. He did labs and they showed Cr 3.17 (GFR 13 ml/min).  Na 128, pot 4.2, calcium 8.6, slightly low Tprotein and albumin. Hepatic testing all normal.  CBC normal except Hb 11.4.  MCV 89.    I have reviewed patient's discharge summary plus pertinent specific notes, labs, and imaging from the hospitalization.    Pt still in severe pain in mid-to-lower back centrally, occ with extension into R LB region. Hurts at rest, made much worse by taking deep breath and moving. Takes 2 vicodin and it helps some, but she has constipation from this med that she is combating with daily miralax and senakot-S.    Medication reconciliation was done today and patient is taking meds as recommended by discharging hospitalist/specialist.    PMH:  Past Medical History:  Diagnosis Date  . Atrophy of left kidney    with absent blood flow by renal artery dopplers (Dr. Gwenlyn Found)  . 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 Chronic renal insufficiency, stage 2 (mild)    GFR @ 60.  01/2016 renal u/s showed atrophic/hypoplastic left kidney.  No hydronephrosis.  Small right renal cyst.  . History of adenomatous polyp of colon   . History of subarachnoid hemorrhage 10/2014   after syncope  and while on xarelto  . Hyperlipidemia   . Hypertension    Difficult to control, in the setting of one functioning kidney: pt was referred to nephrology by Dr. Gwenlyn Found 06/2015.  . Lumbar radiculopathy 2012  . Lumbar spondylosis    MR 07/2016---no sign of spinal nerve compression or cord compression.  Pt set up with outpt ortho while admitted to hosp 07/2016.  . Metatarsal fracture 06/10/2016   Nondisplaced, left 5th metatarsal--pt was referred to ortho  . Osteopenia 2014   T-score -2.1  . PAF (paroxysmal atrial fibrillation) (HCC)    Eliquis started after BRAO and CVA  . Stroke [facility])    cardioembolic (had CVA while on no anticoag)--"scattered subacute punctate infarcts: 1 in R parietal lobe and 2 in occipital cortex" on MRI br.  CT angio head/neck: aortic arch athero  . Thoracic compression fracture ([place]) 07/2016   T7 and T8-- T8 kyphoplasty during hosp admission 07/2016.    PSH:  Past Surgical History:  Procedure Laterality Date  . APPENDECTOMY  child  . CARDIOVASCULAR STRESS TEST  01/31/2016   Stress myoview: NORMAL/Low risk.  EF 56%.  . [place]  . COLONOSCOPY  2015   + hx of adenomatous polyps.  Need digest health spec in [place] records to see when pt due for next colonoscopy  . IR GENERIC HISTORICAL  07/24/2016   IR KYPHO THORACIC WITH BONE BIOPSY 07/24/2016 Luanne Bras, MD [facility]  . KYPHOPLASTY  07/27/2016   T8  .  Renal artery dopplers  02/26/2016   Her right renal dimension was 11 cm pole to pole with mild to moderate right renal artery stenosis. A right renal aortic ratio was 3.22 suggesting less than a 50% stenosis.  . TONSILLECTOMY    . TRANSTHORACIC ECHOCARDIOGRAM  01/29/2016   EF 55-60%, normal LV wall motion, grade I DD.  No cardiac source of emboli was seen.    MEDS:  Outpatient Medications Prior to Visit  Medication Sig Dispense Refill  . acetaminophen (TYLENOL) 500 MG tablet Take 500 mg by mouth every 6 (six) hours as needed for moderate  pain.    [name] amLODipine (NORVASC) 5 MG tablet Take 1 tablet (5 mg total) by mouth 2 (two) times daily with breakfast and lunch. 180 tablet 3  . apixaban (ELIQUIS) 2.5 MG TABS tablet Take 1 tablet (2.5 mg total) by mouth 2 (two) times daily. 60 tablet 0  . atenolol-chlorthalidone (TENORETIC) 100-25 MG tablet Take 1 tablet by mouth every morning.    . Calcium Carb-Cholecalciferol (CALCIUM/VITAMIN D PO) Take 1 tablet by mouth 4 (four) times a week.    . hydrALAZINE (APRESOLINE) 25 MG tablet Take 3 tablets (75 mg total) by mouth 3 (three) times daily.    [name] HYDROcodone-acetaminophen (NORCO) 5-325 MG tablet Take 1 tablet by mouth every 8 (eight) hours as needed for moderate pain. 10 tablet 0  . Magnesium Gluconate (MAGNESIUM 27) 500 (27 Mg) MG TABS Take 500 mg by mouth 4 (four) times a week.     . methocarbamol (ROBAXIN) 750 MG tablet Take 1 tablet (750 mg total) by mouth every 8 (eight) hours as needed for muscle spasms. 15 tablet 0  . Multiple Vitamin (MULTIVITAMIN) tablet Take 1 tablet by mouth 3 (three) times a week.     . ondansetron (ZOFRAN) 4 MG tablet Take 1 tablet (4 mg total) by mouth every 8 (eight) hours as needed for nausea or vomiting. (Patient taking differently: Take 2-4 mg by mouth every 8 (eight) hours as needed for nausea or vomiting. ) 20 tablet 0  . pravastatin (PRAVACHOL) 40 MG tablet Take 40 mg by mouth every evening.    . vitamin C (ASCORBIC ACID) 500 MG tablet Take 500 mg by mouth daily.    [name] lisinopril (PRINIVIL,ZESTRIL) 10 MG tablet Take 1 tablet (10 mg total) by mouth daily. (Patient not taking: Reported on 07/30/2016) 30 tablet 0   No facility-administered medications prior to visit.     Pertinent labs/imaging Lab Results  Component Value Date   TSH 2.57 03/23/2016   Lab Results  Component Value Date   WBC 11.0 (H) 07/25/2016   HGB 10.7 (L) 07/25/2016   HCT 34.1 (L) 07/25/2016   MCV 95.5 07/25/2016   PLT 224 07/25/2016   Lab Results  Component Value Date    CREATININE 1.59 (H) 07/22/2016   BUN 43 (H) 07/22/2016   NA 134 (L) 07/22/2016   K 4.1 07/22/2016   CL 99 (L) 07/22/2016   CO2 26 07/22/2016  Calcium 8.7 on 07/22/16  Lab Results  Component Value Date   ALT 27 07/22/2016   AST 35 07/22/2016   ALKPHOS 48 07/22/2016   BILITOT 0.8 07/22/2016   Lab Results  Component Value Date   CHOL 140 06/17/2016   Lab Results  Component Value Date   HDL 39 (L) 06/17/2016   Lab Results  Component Value Date   LDLCALC 72 06/17/2016   Lab Results  Component Value Date   TRIG 146 06/17/2016  Lab Results  Component Value Date   CHOLHDL 3.6 06/17/2016   Lab Results  Component Value Date   HGBA1C 5.6 01/29/2016   EXAM: BP 134/68 (BP Location: Right Arm, Patient Position: Sitting, Cuff Size: Normal)   Pulse 63   Temp 97.7 F (36.5 C) (Oral)   Resp 16   Ht 5' (1.524 m)   Wt 152 lb 12 oz (69.3 kg)   SpO2 93%   BMI 29.83 kg/m  Gen: Alert, well appearing.  Patient is oriented to person, place, time, and situation. JYN:WGNF: no injection, icteris, swelling, or exudate.  EOMI, PERRLA. Mouth: lips without lesion/swelling.  Oral mucosa pink and moist. Oropharynx without erythema, exudate, or swelling.  CV: RRR, no m/r/g.   LUNGS: CTA bilat, nonlabored resps, good aeration in all lung fields. EXT: no clubbing or cyanosis.  She has 3-4+ pitting edema from knees down to feet bilat.  ASSESSMENT/PLAN:  1) Osteoporotic vertebral compression fractures--T7 and T8.  She is s/p kyphoplasty stabilization of T8 by IR in hosp. Pain is still severe and unrelenting, only mild/mod relief from vicodin 5/325. Plan is to change pain med to percocet 5/325, 1-2 q6h prn, #60.   She does have [place] PT set up but is pretty much unable to participate due to her LE edema. If pain not well controlled with percocet, will need to decide whether to try morphine or continue milder narcotic and add calcitonin for 4-6 weeks.  2) CRI: has only 1 functioning kidney  (right).  Acute renal injury lately could be from uncontrolled HTN or some hypotensive episodes assoc with dehydration vs contrast used for her CT angio head and neck 01/29/16. Dr. Florene Glen following, recently stopped her lisinopril and added lasix 80mg  bid.  He has plans to recheck her labs Tuesday of next week(5 days).  BP control not ideal per pt home monitoring but she is in a lot of pain so I won't add any additional bp med at this time.  3) LE edema: likely secondary to CRI and mild low albumin.  She is following low Na diet. She'll try to elevate legs if back pain allows.  Dr. Florene Glen started lasix 80mg  bid yesterday--no change in edema as of yet.  Medical decision making of high complexity was utilized today.  An After Visit Summary was printed and given to the patient.  FOLLOW UP:  7-8d--f/u pain.  Signed:  Crissie Sickles, MD           07/30/2016

## 2016-07-30 ENCOUNTER — Encounter: Payer: Self-pay | Admitting: Family Medicine

## 2016-07-30 ENCOUNTER — Ambulatory Visit (INDEPENDENT_AMBULATORY_CARE_PROVIDER_SITE_OTHER): Payer: Medicare Other | Admitting: Family Medicine

## 2016-07-30 VITALS — BP 134/68 | HR 63 | Temp 97.7°F | Resp 16 | Ht 60.0 in | Wt 152.8 lb

## 2016-07-30 DIAGNOSIS — R609 Edema, unspecified: Secondary | ICD-10-CM

## 2016-07-30 DIAGNOSIS — N182 Chronic kidney disease, stage 2 (mild): Secondary | ICD-10-CM

## 2016-07-30 DIAGNOSIS — M4850XS Collapsed vertebra, not elsewhere classified, site unspecified, sequela of fracture: Secondary | ICD-10-CM | POA: Diagnosis not present

## 2016-07-30 DIAGNOSIS — IMO0001 Reserved for inherently not codable concepts without codable children: Secondary | ICD-10-CM

## 2016-07-30 DIAGNOSIS — Z905 Acquired absence of kidney: Secondary | ICD-10-CM

## 2016-07-30 DIAGNOSIS — N184 Chronic kidney disease, stage 4 (severe): Secondary | ICD-10-CM

## 2016-07-30 MED ORDER — OXYCODONE-ACETAMINOPHEN 5-325 MG PO TABS: 1.0000 | ORAL_TABLET | Freq: Four times a day (QID) | ORAL | 0 refills | Status: DC | PRN

## 2016-07-30 MED ORDER — ONDANSETRON HCL 4 MG PO TABS: 2.0000 mg | ORAL_TABLET | Freq: Three times a day (TID) | ORAL | 3 refills | Status: DC | PRN

## 2016-07-30 NOTE — Progress Notes (Signed)
Pre visit review using our clinic review tool, if applicable. No additional management support is needed unless otherwise documented below in the visit note. 

## 2016-07-31 ENCOUNTER — Telehealth: Payer: Self-pay | Admitting: Family Medicine

## 2016-07-31 NOTE — Telephone Encounter (Signed)
Crystal called back and stated that she will do an add on the the labs dawn on 07/29/16. This is okay per Dr. Anitra Lauth.

## 2016-07-31 NOTE — Telephone Encounter (Signed)
Left message for lab tech Crystal to call back.

## 2016-07-31 NOTE — Telephone Encounter (Signed)
Please call [facility], Dr. Abel Presto nurse.  Mrs.  Terrell is getting some labs ordered by Dr. Florene Glen on Tuesday of next week. To save her an extra blood draw, could you see if they would add some labs to his order for me? It would be a 25 hydroxy vitamin D level, dx is osteoporosis.--thx

## 2016-08-03 ENCOUNTER — Ambulatory Visit: Payer: Medicare Other | Admitting: Family Medicine

## 2016-08-03 ENCOUNTER — Telehealth: Payer: Self-pay | Admitting: Cardiovascular Disease

## 2016-08-03 ENCOUNTER — Telehealth: Payer: Self-pay | Admitting: Family Medicine

## 2016-08-03 NOTE — Telephone Encounter (Signed)
Rcvd records from [facility] for ov with Dr. Gwenlyn Found. Gave to [place]. [place]

## 2016-08-03 NOTE — Telephone Encounter (Signed)
Barnabas Lister, physical therapy with [facility] is calling to report that the patient was seen only on 03/13 but did not want to see home health after that.  Thank you,  -LL

## 2016-08-03 NOTE — Telephone Encounter (Signed)
Noted  

## 2016-08-03 NOTE — Telephone Encounter (Signed)
FYI

## 2016-08-04 DIAGNOSIS — N182 Chronic kidney disease, stage 2 (mild): Secondary | ICD-10-CM | POA: Diagnosis not present

## 2016-08-05 ENCOUNTER — Inpatient Hospital Stay (HOSPITAL_COMMUNITY): Payer: Medicare Other

## 2016-08-05 ENCOUNTER — Inpatient Hospital Stay (HOSPITAL_COMMUNITY)
Admission: EM | Admit: 2016-08-05 | Discharge: 2016-08-08 | DRG: 683 | Disposition: A | Discharge status: Home / Self Care | Payer: Medicare Other | Attending: Internal Medicine | Admitting: Internal Medicine

## 2016-08-05 ENCOUNTER — Emergency Department (HOSPITAL_COMMUNITY): Payer: Medicare Other

## 2016-08-05 ENCOUNTER — Encounter (HOSPITAL_COMMUNITY): Payer: Self-pay | Admitting: Emergency Medicine

## 2016-08-05 ENCOUNTER — Ambulatory Visit: Payer: Medicare Other | Admitting: Family Medicine

## 2016-08-05 ENCOUNTER — Telehealth: Payer: Self-pay | Admitting: Family Medicine

## 2016-08-05 ENCOUNTER — Ambulatory Visit: Payer: Medicare Other | Admitting: Cardiovascular Disease

## 2016-08-05 DIAGNOSIS — I480 Paroxysmal atrial fibrillation: Secondary | ICD-10-CM | POA: Diagnosis present

## 2016-08-05 DIAGNOSIS — Z882 Allergy status to sulfonamides status: Secondary | ICD-10-CM

## 2016-08-05 DIAGNOSIS — M546 Pain in thoracic spine: Secondary | ICD-10-CM | POA: Diagnosis not present

## 2016-08-05 DIAGNOSIS — N184 Chronic kidney disease, stage 4 (severe): Secondary | ICD-10-CM | POA: Diagnosis not present

## 2016-08-05 DIAGNOSIS — E876 Hypokalemia: Secondary | ICD-10-CM | POA: Diagnosis not present

## 2016-08-05 DIAGNOSIS — Z7901 Long term (current) use of anticoagulants: Secondary | ICD-10-CM

## 2016-08-05 DIAGNOSIS — Z8673 Personal history of transient ischemic attack (TIA), and cerebral infarction without residual deficits: Secondary | ICD-10-CM

## 2016-08-05 DIAGNOSIS — Z8249 Family history of ischemic heart disease and other diseases of the circulatory system: Secondary | ICD-10-CM | POA: Diagnosis not present

## 2016-08-05 DIAGNOSIS — D631 Anemia in chronic kidney disease: Secondary | ICD-10-CM | POA: Diagnosis not present

## 2016-08-05 DIAGNOSIS — N182 Chronic kidney disease, stage 2 (mild): Secondary | ICD-10-CM | POA: Diagnosis not present

## 2016-08-05 DIAGNOSIS — N183 Chronic kidney disease, stage 3 (moderate): Secondary | ICD-10-CM | POA: Diagnosis not present

## 2016-08-05 DIAGNOSIS — R112 Nausea with vomiting, unspecified: Secondary | ICD-10-CM | POA: Diagnosis not present

## 2016-08-05 DIAGNOSIS — N179 Acute kidney failure, unspecified: Principal | ICD-10-CM | POA: Diagnosis present

## 2016-08-05 DIAGNOSIS — Z91048 Other nonmedicinal substance allergy status: Secondary | ICD-10-CM | POA: Diagnosis not present

## 2016-08-05 DIAGNOSIS — E785 Hyperlipidemia, unspecified: Secondary | ICD-10-CM | POA: Diagnosis not present

## 2016-08-05 DIAGNOSIS — I10 Essential (primary) hypertension: Secondary | ICD-10-CM | POA: Diagnosis present

## 2016-08-05 DIAGNOSIS — Q603 Renal hypoplasia, unilateral: Secondary | ICD-10-CM | POA: Diagnosis not present

## 2016-08-05 DIAGNOSIS — Z79899 Other long term (current) drug therapy: Secondary | ICD-10-CM

## 2016-08-05 DIAGNOSIS — R609 Edema, unspecified: Secondary | ICD-10-CM

## 2016-08-05 DIAGNOSIS — R42 Dizziness and giddiness: Secondary | ICD-10-CM

## 2016-08-05 DIAGNOSIS — I701 Atherosclerosis of renal artery: Secondary | ICD-10-CM | POA: Diagnosis not present

## 2016-08-05 DIAGNOSIS — S22000A Wedge compression fracture of unspecified thoracic vertebra, initial encounter for closed fracture: Secondary | ICD-10-CM | POA: Diagnosis not present

## 2016-08-05 DIAGNOSIS — R110 Nausea: Secondary | ICD-10-CM | POA: Diagnosis not present

## 2016-08-05 DIAGNOSIS — N170 Acute kidney failure with tubular necrosis: Secondary | ICD-10-CM | POA: Diagnosis not present

## 2016-08-05 DIAGNOSIS — E860 Dehydration: Secondary | ICD-10-CM | POA: Diagnosis not present

## 2016-08-05 DIAGNOSIS — Z96698 Presence of other orthopedic joint implants: Secondary | ICD-10-CM | POA: Diagnosis not present

## 2016-08-05 DIAGNOSIS — N189 Chronic kidney disease, unspecified: Secondary | ICD-10-CM | POA: Diagnosis not present

## 2016-08-05 DIAGNOSIS — I129 Hypertensive chronic kidney disease with stage 1 through stage 4 chronic kidney disease, or unspecified chronic kidney disease: Secondary | ICD-10-CM | POA: Diagnosis not present

## 2016-08-05 DIAGNOSIS — M7989 Other specified soft tissue disorders: Secondary | ICD-10-CM | POA: Diagnosis not present

## 2016-08-05 DIAGNOSIS — M79609 Pain in unspecified limb: Secondary | ICD-10-CM | POA: Diagnosis not present

## 2016-08-05 DIAGNOSIS — N289 Disorder of kidney and ureter, unspecified: Secondary | ICD-10-CM

## 2016-08-05 DIAGNOSIS — S22000G Wedge compression fracture of unspecified thoracic vertebra, subsequent encounter for fracture with delayed healing: Secondary | ICD-10-CM | POA: Diagnosis not present

## 2016-08-05 DIAGNOSIS — D638 Anemia in other chronic diseases classified elsewhere: Secondary | ICD-10-CM | POA: Diagnosis not present

## 2016-08-05 DIAGNOSIS — E8770 Fluid overload, unspecified: Secondary | ICD-10-CM | POA: Diagnosis not present

## 2016-08-05 DIAGNOSIS — M4726 Other spondylosis with radiculopathy, lumbar region: Secondary | ICD-10-CM | POA: Diagnosis not present

## 2016-08-05 DIAGNOSIS — Z888 Allergy status to other drugs, medicaments and biological substances status: Secondary | ICD-10-CM | POA: Diagnosis not present

## 2016-08-05 DIAGNOSIS — I700 Atherosclerosis of aorta: Secondary | ICD-10-CM | POA: Diagnosis not present

## 2016-08-05 DIAGNOSIS — M810 Age-related osteoporosis without current pathological fracture: Secondary | ICD-10-CM | POA: Diagnosis present

## 2016-08-05 LAB — CBC WITH DIFFERENTIAL/PLATELET
Basophils Absolute: 0 10*3/uL (ref 0.0–0.1)
Basophils Relative: 0 %
EOS ABS: 0.1 10*3/uL (ref 0.0–0.7)
EOS PCT: 2 %
HCT: 35.5 % — ABNORMAL LOW (ref 36.0–46.0)
HEMOGLOBIN: 11.1 g/dL — AB (ref 12.0–15.0)
LYMPHS PCT: 12 %
Lymphs Abs: 0.8 10*3/uL (ref 0.7–4.0)
MCH: 29.6 pg (ref 26.0–34.0)
MCHC: 31.3 g/dL (ref 30.0–36.0)
MCV: 94.7 fL (ref 78.0–100.0)
MONOS PCT: 9 %
Monocytes Absolute: 0.6 10*3/uL (ref 0.1–1.0)
NEUTROS PCT: 77 %
Neutro Abs: 5.4 10*3/uL (ref 1.7–7.7)
Platelets: 354 10*3/uL (ref 150–400)
RBC: 3.75 MIL/uL — ABNORMAL LOW (ref 3.87–5.11)
RDW: 15.3 % (ref 11.5–15.5)
WBC: 6.9 10*3/uL (ref 4.0–10.5)

## 2016-08-05 LAB — COMPREHENSIVE METABOLIC PANEL
ALT: 14 U/L (ref 14–54)
AST: 28 U/L (ref 15–41)
Albumin: 3.1 g/dL — ABNORMAL LOW (ref 3.5–5.0)
Alkaline Phosphatase: 64 U/L (ref 38–126)
Anion gap: 12 (ref 5–15)
BUN: 70 mg/dL — AB (ref 6–20)
CHLORIDE: 96 mmol/L — AB (ref 101–111)
CO2: 27 mmol/L (ref 22–32)
CREATININE: 2.98 mg/dL — AB (ref 0.44–1.00)
Calcium: 8.8 mg/dL — ABNORMAL LOW (ref 8.9–10.3)
GFR calc Af Amer: 16 mL/min — ABNORMAL LOW (ref >60)
GFR calc non Af Amer: 14 mL/min — ABNORMAL LOW (ref >60)
Glucose, Bld: 115 mg/dL — ABNORMAL HIGH (ref 65–99)
POTASSIUM: 4.3 mmol/L (ref 3.5–5.1)
SODIUM: 135 mmol/L (ref 135–145)
Total Bilirubin: 0.3 mg/dL (ref 0.3–1.2)
Total Protein: 6 g/dL — ABNORMAL LOW (ref 6.5–8.1)

## 2016-08-05 LAB — LIPASE, BLOOD: LIPASE: 60 U/L — AB (ref 11–51)

## 2016-08-05 LAB — TROPONIN I

## 2016-08-05 MED ORDER — HYDRALAZINE HCL 50 MG PO TABS
75.0000 mg | ORAL_TABLET | Freq: Three times a day (TID) | ORAL | Status: DC
  Administered 2016-08-06: 75 mg via ORAL
  Filled 2016-08-05: qty 1

## 2016-08-05 MED ORDER — SODIUM CHLORIDE 0.9% FLUSH: 3.0000 mL | Freq: Two times a day (BID) | INTRAVENOUS | Status: DC

## 2016-08-05 MED ORDER — METHOCARBAMOL 500 MG PO TABS
750.0000 mg | ORAL_TABLET | Freq: Three times a day (TID) | ORAL | Status: DC | PRN
  Filled 2016-08-05: qty 2

## 2016-08-05 MED ORDER — PRAVASTATIN SODIUM 40 MG PO TABS
40.0000 mg | ORAL_TABLET | Freq: Every evening | ORAL | Status: DC
  Administered 2016-08-06 – 2016-08-07 (×3): 40 mg via ORAL
  Filled 2016-08-05 (×3): qty 1

## 2016-08-05 MED ORDER — SODIUM CHLORIDE 0.9 % IV BOLUS (SEPSIS)
500.0000 mL | Freq: Once | INTRAVENOUS | Status: AC
  Administered 2016-08-05: 500 mL via INTRAVENOUS

## 2016-08-05 MED ORDER — CHLORTHALIDONE 25 MG PO TABS
25.0000 mg | ORAL_TABLET | Freq: Every day | ORAL | Status: DC
  Administered 2016-08-06: 25 mg via ORAL
  Filled 2016-08-05: qty 1

## 2016-08-05 MED ORDER — SODIUM CHLORIDE 0.9 % IV SOLN
INTRAVENOUS | Status: DC
  Administered 2016-08-05: 19:00:00 via INTRAVENOUS

## 2016-08-05 MED ORDER — SODIUM CHLORIDE 0.9% FLUSH: 3.0000 mL | INTRAVENOUS | Status: DC | PRN

## 2016-08-05 MED ORDER — ACETAMINOPHEN 650 MG RE SUPP: 650.0000 mg | Freq: Four times a day (QID) | RECTAL | Status: DC | PRN

## 2016-08-05 MED ORDER — ATENOLOL 50 MG PO TABS
100.0000 mg | ORAL_TABLET | Freq: Every day | ORAL | Status: DC
  Administered 2016-08-06 – 2016-08-08 (×3): 100 mg via ORAL
  Filled 2016-08-05 (×3): qty 2

## 2016-08-05 MED ORDER — SODIUM CHLORIDE 0.9% FLUSH
3.0000 mL | Freq: Two times a day (BID) | INTRAVENOUS | Status: DC
  Administered 2016-08-06 – 2016-08-08 (×5): 3 mL via INTRAVENOUS

## 2016-08-05 MED ORDER — APIXABAN 2.5 MG PO TABS
2.5000 mg | ORAL_TABLET | Freq: Two times a day (BID) | ORAL | Status: DC
  Administered 2016-08-06 – 2016-08-08 (×6): 2.5 mg via ORAL
  Filled 2016-08-05 (×6): qty 1

## 2016-08-05 MED ORDER — PROCHLORPERAZINE EDISYLATE 5 MG/ML IJ SOLN
5.0000 mg | Freq: Once | INTRAMUSCULAR | Status: AC
  Administered 2016-08-05: 5 mg via INTRAVENOUS
  Filled 2016-08-05: qty 2

## 2016-08-05 MED ORDER — ATENOLOL-CHLORTHALIDONE 100-25 MG PO TABS: 1.0000 | ORAL_TABLET | Freq: Every morning | ORAL | Status: DC

## 2016-08-05 MED ORDER — SODIUM CHLORIDE 0.9 % IV SOLN: 250.0000 mL | INTRAVENOUS | Status: DC | PRN

## 2016-08-05 MED ORDER — ACETAMINOPHEN 325 MG PO TABS: 650.0000 mg | ORAL_TABLET | Freq: Four times a day (QID) | ORAL | Status: DC | PRN

## 2016-08-05 MED ORDER — ONDANSETRON HCL 4 MG PO TABS
2.0000 mg | ORAL_TABLET | Freq: Three times a day (TID) | ORAL | Status: DC | PRN
  Administered 2016-08-06 – 2016-08-08 (×3): 4 mg via ORAL
  Filled 2016-08-05 (×3): qty 1

## 2016-08-05 MED ORDER — OXYCODONE-ACETAMINOPHEN 5-325 MG PO TABS
1.0000 | ORAL_TABLET | Freq: Four times a day (QID) | ORAL | Status: DC | PRN
  Administered 2016-08-06 – 2016-08-08 (×8): 1 via ORAL
  Filled 2016-08-05 (×7): qty 1
  Filled 2016-08-05: qty 2

## 2016-08-05 MED ORDER — ONDANSETRON HCL 4 MG/2ML IJ SOLN
4.0000 mg | Freq: Once | INTRAMUSCULAR | Status: AC
  Administered 2016-08-05: 4 mg via INTRAVENOUS
  Filled 2016-08-05: qty 2

## 2016-08-05 MED ORDER — ONDANSETRON 4 MG PO TBDP
4.0000 mg | ORAL_TABLET | Freq: Once | ORAL | Status: AC
  Administered 2016-08-05: 4 mg via ORAL

## 2016-08-05 MED ORDER — HYDROMORPHONE HCL 1 MG/ML IJ SOLN
0.5000 mg | INTRAMUSCULAR | Status: DC | PRN
  Administered 2016-08-05: 0.5 mg via INTRAVENOUS
  Filled 2016-08-05: qty 1

## 2016-08-05 MED ORDER — ALBUTEROL SULFATE (2.5 MG/3ML) 0.083% IN NEBU: 2.5000 mg | INHALATION_SOLUTION | RESPIRATORY_TRACT | Status: DC | PRN

## 2016-08-05 MED ORDER — ONDANSETRON 4 MG PO TBDP
ORAL_TABLET | ORAL | Status: AC
  Filled 2016-08-05: qty 1

## 2016-08-05 NOTE — ED Triage Notes (Signed)
Pt reports recent hospital stays, one for kidney failure and one for back surgery.Pt reports continued back pain after surgery. Pt here for new onset vomiting with occasional abdominal cramping. Pt also has chills.

## 2016-08-05 NOTE — ED Notes (Signed)
Pts daughter concerned pt seems to be gasping for breath in sleep, this RN reviewed the pts vitals, there are intermittent episodes of O2 sats at 88%, pt placed on 2 L [place], while pt is alert pts Ow sats 93-94%, pts daughter updated on plan of care

## 2016-08-05 NOTE — ED Notes (Signed)
Pt requesting more nausea medication. Dr. Tomi Bamberger informed.

## 2016-08-05 NOTE — ED Provider Notes (Signed)
[facility] DEPT Provider Note   CSN: [id number] Arrival date & time: 08/05/16  1528     History   Chief Complaint Chief Complaint  Patient presents with  . Emesis  . Constipation    HPI Valerie Terrell is a 83 y.o. female.  HPI Pt has been having trouble with persistent back pain associated with compression fractures.  She has been having chronic leg swelling associated as well and has seen her nephrologist but it has not gotten any better.  Her symptoms have been getting progressively worse.  She is not feeling any better.  She is not eating or drinking well and her appetite is poor.  She started with nausea and vomiting and weakness today.   She vomited several times today.   She denies any fever.   She denies any diarrhea.  She has some constipation but that has not been new and ongoing with her pain meds.  She has seen many specialists including Dr Estanislado Pandy.  She had kyphoplasty but has not felt any better since the procedure two weeks ago.  She is due to see him on Friday.  She has had persistent leg swelling.  She saw her nephrologist and was started on additional lasix but that has not helped.  She is very frustrated that she is not any better and feels miserable. Past Medical History:  Diagnosis Date  . Atrophy of left kidney    with absent blood flow by renal artery dopplers (Dr. Gwenlyn Found)  . 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 Chronic renal insufficiency, stage 2 (mild)    GFR @ 60.  01/2016 renal u/s showed atrophic/hypoplastic left kidney.  No hydronephrosis.  Small right renal cyst.  . History of adenomatous polyp of colon   . History of subarachnoid hemorrhage 10/2014   after syncope and while on xarelto  . Hyperlipidemia   . Hypertension    Difficult to control, in the setting of one functioning kidney: pt was referred to nephrology by Dr. Gwenlyn Found 06/2015.  . Lumbar radiculopathy 2012  . Lumbar spondylosis    MR 07/2016---no sign of spinal nerve  compression or cord compression.  Pt set up with outpt ortho while admitted to hosp 07/2016.  . Metatarsal fracture 06/10/2016   Nondisplaced, left 5th metatarsal--pt was referred to ortho  . Osteopenia 2014   T-score -2.1  . PAF (paroxysmal atrial fibrillation) (HCC)    Eliquis started after BRAO and CVA  . Stroke [facility])    cardioembolic (had CVA while on no anticoag)--"scattered subacute punctate infarcts: 1 in R parietal lobe and 2 in occipital cortex" on MRI br.  CT angio head/neck: aortic arch athero  . Thoracic compression fracture ([place]) 07/2016   T7 and T8-- T8 kyphoplasty during hosp admission 07/2016.    Patient Active Problem List   Diagnosis Date Noted  . Osteoporosis 07/28/2016  . Compression fracture of body of thoracic vertebra ([place]) 07/23/2016  . Thoracic compression fracture ([place]) 07/22/2016  . Acute renal failure superimposed on chronic kidney disease ([name]) 07/11/2016  . Flank pain 07/11/2016  . Hypoalbuminemia 07/11/2016  . Renal artery stenosis ([place]) 06/24/2016  . Nondisplaced fracture of fifth metatarsal bone, left foot, initial encounter for closed fracture 06/10/2016  . Rib contusion, left, initial encounter 06/10/2016  . Acute on chronic renal insufficiency 03/13/2016  . Single kidney 03/13/2016  . Chest pain   . Dyslipidemia   . PAF (paroxysmal atrial fibrillation) ([place]) 01/28/2016  . Essential hypertension 01/28/2016  .  Cardioembolic stroke ([place]) 70/35/0093  . 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 Personal history of subarachnoid hemorrhage 01/28/2016  . CKD (chronic kidney disease), stage II 01/28/2016    Past Surgical History:  Procedure Laterality Date  . APPENDECTOMY  child  . CARDIOVASCULAR STRESS TEST  01/31/2016   Stress myoview: NORMAL/Low risk.  EF 56%.  . [place]  . COLONOSCOPY  2015   + hx of adenomatous polyps.  Need digest health spec in [place] records to see when pt due for next colonoscopy  . IR  GENERIC HISTORICAL  07/24/2016   IR KYPHO THORACIC WITH BONE BIOPSY 07/24/2016 Luanne Bras, MD [facility]  . KYPHOPLASTY  07/27/2016   T8  . Renal artery dopplers  02/26/2016   Her right renal dimension was 11 cm pole to pole with mild to moderate right renal artery stenosis. A right renal aortic ratio was 3.22 suggesting less than a 50% stenosis.  . TONSILLECTOMY    . TRANSTHORACIC ECHOCARDIOGRAM  01/29/2016   EF 55-60%, normal LV wall motion, grade I DD.  No cardiac source of emboli was seen.    OB History    No data available       Home Medications    Prior to Admission medications   Medication Sig Start Date End Date Taking? Authorizing Provider  acetaminophen (TYLENOL) 500 MG tablet Take 500 mg by mouth every 6 (six) hours as needed for moderate pain.   Yes Historical Provider, MD  amLODipine (NORVASC) 5 MG tablet Take 1 tablet (5 mg total) by mouth 2 (two) times daily with breakfast and lunch. Patient taking differently: Take 5 mg by mouth 2 (two) times daily.  05/25/16  Yes Lorretta Harp, MD  apixaban (ELIQUIS) 2.5 MG TABS tablet Take 1 tablet (2.5 mg total) by mouth 2 (two) times daily. 07/20/16  Yes Hosie Poisson, MD  atenolol-chlorthalidone (TENORETIC) 100-25 MG tablet Take 1 tablet by mouth every morning. 01/27/16  Yes Historical Provider, MD  Calcium Carb-Cholecalciferol (CALCIUM/VITAMIN D PO) Take 1 tablet by mouth 2 (two) times daily.   Yes Historical Provider, MD  furosemide (LASIX) 80 MG tablet Take 80 mg by mouth 2 (two) times daily.   Yes Historical Provider, MD  hydrALAZINE (APRESOLINE) 25 MG tablet Take 3 tablets (75 mg total) by mouth 3 (three) times daily. 07/20/16  Yes Hosie Poisson, MD  Magnesium Gluconate (MAGNESIUM 27) 500 (27 Mg) MG TABS Take 500 mg by mouth 4 (four) times a week.    Yes Historical Provider, MD  Multiple Vitamin (MULTIVITAMIN) tablet Take 1 tablet by mouth 3 (three) times a week.    Yes Historical Provider, MD  ondansetron (ZOFRAN) 4 MG tablet  Take 0.5-1 tablets (2-4 mg total) by mouth every 8 (eight) hours as needed for nausea or vomiting. 07/30/16  Yes Tammi Sou, MD  oxyCODONE-acetaminophen (PERCOCET/ROXICET) 5-325 MG tablet Take 1-2 tablets by mouth every 6 (six) hours as needed for severe pain. 07/30/16  Yes Tammi Sou, MD  pravastatin (PRAVACHOL) 40 MG tablet Take 40 mg by mouth every evening. 07/16/15  Yes Historical Provider, MD  vitamin C (ASCORBIC ACID) 500 MG tablet Take 500 mg by mouth daily.   Yes Historical Provider, MD  methocarbamol (ROBAXIN) 750 MG tablet Take 1 tablet (750 mg total) by mouth every 8 (eight) hours as needed for muscle spasms. Patient not taking: Reported on 08/05/2016 07/20/16   Hosie Poisson, MD    Family History Family History  Problem Relation Age of Onset  . Heart disease Father   . Early death Father   . Sudden Cardiac Death Neg Hx     Social History Social History  Substance Use Topics  . Smoking status: Never Smoker  . Smokeless tobacco: Never Used  . Alcohol use 0.6 oz/week    1 Glasses of wine per week     Comment: wine     Allergies   Clonidine derivatives; Codeine; Nickel; and Sulfa antibiotics   Review of Systems Review of Systems  All other systems reviewed and are negative.    Physical Exam Updated Vital Signs BP (!) 155/64   Pulse 67   Temp 97.6 F (36.4 C) (Oral)   Resp 18   SpO2 97%   Physical Exam  Constitutional: She appears distressed.  Sitting up in the bed, appears to be in pain   HENT:  Head: Normocephalic and atraumatic.  Right Ear: External ear normal.  Left Ear: External ear normal.  Eyes: Conjunctivae are normal. Right eye exhibits no discharge. Left eye exhibits no discharge. No scleral icterus.  Neck: Neck supple. No tracheal deviation present.  Cardiovascular: Normal rate, regular rhythm and intact distal pulses.   Pulmonary/Chest: Effort normal and breath sounds normal. No stridor. No respiratory distress. She has no wheezes. She  has no rales.  Abdominal: Soft. Bowel sounds are normal. She exhibits no distension and no mass. There is no tenderness. There is no rebound and no guarding.  Musculoskeletal: She exhibits no edema or tenderness.  TTP mid thoracic spine, no edema, kyphosis of the spine  Neurological: She is alert. She has normal strength. No cranial nerve deficit (no facial droop, extraocular movements intact, no slurred speech) or sensory deficit. She exhibits normal muscle tone. She displays no seizure activity. Coordination normal.  Skin: Skin is warm and dry. No rash noted. She is not diaphoretic.  Psychiatric: She exhibits a depressed mood.  Nursing note and vitals reviewed.    ED Treatments / Results  Labs (all labs ordered are listed, but only abnormal results are displayed) Labs Reviewed  LIPASE, BLOOD - Abnormal; Notable for the following:       Result Value   Lipase 60 (*)    All other components within normal limits  COMPREHENSIVE METABOLIC PANEL - Abnormal; Notable for the following:    Chloride 96 (*)    Glucose, Bld 115 (*)    BUN 70 (*)    Creatinine, Ser 2.98 (*)    Calcium 8.8 (*)    Total Protein 6.0 (*)    Albumin 3.1 (*)    GFR calc non Af Amer 14 (*)    GFR calc Af Amer 16 (*)    All other components within normal limits  CBC WITH DIFFERENTIAL/PLATELET - Abnormal; Notable for the following:    RBC 3.75 (*)    Hemoglobin 11.1 (*)    HCT 35.5 (*)    All other components within normal limits  URINALYSIS, ROUTINE W REFLEX MICROSCOPIC    EKG  EKG Interpretation None       Radiology Dg Thoracic Spine 2 View  Result Date: 08/05/2016 CLINICAL DATA:  Mid thoracic pain for 2 weeks, status post kyphoplasty EXAM: THORACIC SPINE 2 VIEWS COMPARISON:  07/22/2016 and 07/24/2016 FINDINGS: Three views of thoracic spine submitted. No acute fracture or subluxation. Stable mild compression deformity T7 vertebral body. Again noted prior vertebroplasty and compression deformity T8  vertebral body. IMPRESSION: No acute fracture or subluxation. Stable mild  compression deformity T7 vertebral body. Again noted prior vertebroplasty and compression deformity T8 vertebral body. Electronically Signed   By: Lahoma Crocker M.D.   On: 08/05/2016 17:48   Dg Abdomen Acute W/chest  Result Date: 08/05/2016 CLINICAL DATA:  Mid thoracic pain following T8 kyphoplasty 2 weeks ago. Nausea and vomiting for 1 day. EXAM: DG ABDOMEN ACUTE W/ 1V CHEST COMPARISON:  One-view chest x-ray 07/24/2016 FINDINGS: The heart is enlarged. Aortic atherosclerosis is again noted. A left pleural effusion is present. Retrocardiac basilar airspace opacification is noted as well. Mild pulmonary vascular congestion is present without frank edema. The right lung is otherwise clear. Remote right-sided rib fractures are again seen. T8 vertebral augmentation is noted. Bowel gas pattern is unremarkable. There is no obstruction or free air. Gas and stool are present throughout a normal-sized colon. The stents atherosclerotic calcifications are present at distal aorta and branch vessels. The axial skeleton is unremarkable. IMPRESSION: 1. Cardiomegaly and moderate pulmonary vascular congestion without frank edema. 2. New left pleural effusion and associated airspace disease. While this likely reflects atelectasis, infection is not excluded. 3. Aortic atherosclerosis.  Branch vessel disease is noted as well. 4. Normal bowel gas pattern without evidence for obstruction or free air. Electronically Signed   By: San Morelle M.D.   On: 08/05/2016 17:49    Procedures Procedures (including critical care time)  Medications Ordered in ED Medications  0.9 %  sodium chloride infusion (not administered)  HYDROmorphone (DILAUDID) injection 0.5 mg (0.5 mg Intravenous Given 08/05/16 1642)  prochlorperazine (COMPAZINE) injection 5 mg (not administered)  ondansetron (ZOFRAN-ODT) disintegrating tablet 4 mg (4 mg Oral Given 08/05/16 1552)    ondansetron (ZOFRAN) injection 4 mg (4 mg Intravenous Given 08/05/16 1641)  sodium chloride 0.9 % bolus 500 mL (0 mLs Intravenous Stopped 08/05/16 1802)  sodium chloride 0.9 % bolus 500 mL (500 mLs Intravenous New Bag/Given 08/05/16 1737)     Initial Impression / Assessment and Plan / ED Course  I have reviewed the triage vital signs and the nursing notes.  Pertinent labs & imaging results that were available during my care of the patient were reviewed by me and considered in my medical decision making (see chart for details).  Clinical Course as of Aug 05 1808  Wed Aug 05, 2016  1749 Pt is more comfortable after pain meds  [JK]  1750 Discussed findings with patient and family regarding increasing renal function.  [JK]    Clinical Course User Index [JK] Dorie Rank, MD    Patient presents to emergency room with complaints of persistent back pain associated with her recent thoracic compression fracture and kyphoplasty. Patient also has difficulties with increasing peripheral edema. She's had severe nausea that has not improved with 2 doses of Zofran. I will try a low dose of Compazine.  Patient was given IV fluids for her renal insufficiency which may be related to her increasing Lasix dosing however she still has evidence of significant volume overload and peripheral edema. I will consult the medical service for admission and further treatment. Final Clinical Impressions(s) / ED Diagnoses   Final diagnoses:  Acute renal failure superimposed on chronic kidney disease, unspecified CKD stage, unspecified acute renal failure type ([place])  Peripheral edema  Nausea      Dorie Rank, MD 08/05/16 1810

## 2016-08-05 NOTE — H&P (Signed)
Valerie Terrell JHE:[id number] DOB: 11/26/1932 DOA: 08/05/2016     PCP: Tammi Sou, MD   Outpatient Specialists: cardiology Gwenlyn Found, Neurology Xu, Dr. Florene Glen with nephrology Patient coming from:  With family daughter    Chief Complaint: back pain nausea and vomiting  HPI: Valerie Terrell is a 83 y.o. female with medical history significant of solitary kidney, CKD thoracic compression fracture, PAF hypertension hyperlipidemia, CVA osteoporosis    Presented with nausea vomiting since today abdominal cramping some occasional chills. She had an episode of vertigo lasting for 5 min. Denies confusion no localized weakness, She have had episodes of lightheadedness worse with standing up. NO syncope. Some dyspnea on exertion Decreased appetite no fever no diarrhea.  Reports mild chest pain lasting just a few minutes.  Patient have had recent recurrent admissions. In February 24 patient was admitted for acute on chronic renal failure at her baseline creatinine is 1.4 Patient clinically at that time appeared to have evidence of fluid overload she was diuresed and her creatinine seemed to be improving nephrology have seen her in consult echogram at that time showed diastolic dysfunction small pericardial effusion without tamponade. She was complaining of back pain at the time MRI of the spine showed some ligamentum flavum thickening and mild facet degenerative change. He was given Medrol dosepak as per recommendation of orthopedics and her pain was treated with IV morphine and Robaxin She went home but came back on March 7 with continued back pain Orene Desanctis family at that time showed T7 and T8 progression of compression fracture she was admitted again IR has been consulted and performed kyphoplasty on March 10 with good pain control.   at that time her creatinine was down to 1.5.  Since her discharge she has seen nephrology who have aided 80 mg of Lasix twice a day secondary to persistent lower extremity  edema. March 15 her creatinine show to be 3.17 sodium 128 was noted to have low albumin at that time      IN ER:  Temp (24hrs), Avg:97.6 F (36.4 C), Min:97.6 F (36.4 C), Max:97.6 F (36.4 C)    RR 14 95% HR 69 BP 153/60 Lipase 60  Na 135 Cr 2.98 Alb 3.1  WBC 6.9 HG 11.1  Plan imaging stable Chest x-ray showing cardiomegaly no frank edema new left pleural effusion no evidence of obstruction or free air Following Medications were ordered in ER: Medications  0.9 %  sodium chloride infusion ( Intravenous New Bag/Given 08/05/16 1830)  HYDROmorphone (DILAUDID) injection 0.5 mg (0.5 mg Intravenous Given 08/05/16 1642)  ondansetron (ZOFRAN-ODT) disintegrating tablet 4 mg (4 mg Oral Given 08/05/16 1552)  ondansetron (ZOFRAN) injection 4 mg (4 mg Intravenous Given 08/05/16 1641)  sodium chloride 0.9 % bolus 500 mL (0 mLs Intravenous Stopped 08/05/16 1802)  sodium chloride 0.9 % bolus 500 mL (0 mLs Intravenous Stopped 08/05/16 1827)  prochlorperazine (COMPAZINE) injection 5 mg (5 mg Intravenous Given 08/05/16 1825)      Hospitalist was called for admission for Persistent back pain and AKI  Review of Systems:    Pertinent positives include:  chills,  fatigue,nausea, vomiting,  Constitutional:  No weight loss, night sweats, Fevers weight loss  HEENT:  No headaches, Difficulty swallowing,Tooth/dental problems,Sore throat,  No sneezing, itching, ear ache, nasal congestion, post nasal drip,  Cardio-vascular:  No chest pain, Orthopnea, PND, anasarca, dizziness, palpitations.no Bilateral lower extremity swelling  GI:  No heartburn, indigestion, abdominal pain,  diarrhea, change in bowel habits, loss of appetite, melena, blood  in stool, hematemesis Resp:  no shortness of breath at rest. No dyspnea on exertion, No excess mucus, no productive cough, No non-productive cough, No coughing up of blood.No change in color of mucus.No wheezing. Skin:  no rash or lesions. No jaundice GU:  no  dysuria, change in color of urine, no urgency or frequency. No straining to urinate.  No flank pain.  Musculoskeletal:  No joint pain or no joint swelling. No decreased range of motion. No back pain.  Psych:  No change in mood or affect. No depression or anxiety. No memory loss.  Neuro: no localizing neurological complaints, no tingling, no weakness, no double vision, no gait abnormality, no slurred speech, no confusion  As per HPI otherwise 10 point review of systems negative.   Past Medical History: Past Medical History:  Diagnosis Date  . Atrophy of left kidney    with absent blood flow by renal artery dopplers (Dr. Gwenlyn Found)  . 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 Chronic renal insufficiency, stage 2 (mild)    GFR @ 60.  01/2016 renal u/s showed atrophic/hypoplastic left kidney.  No hydronephrosis.  Small right renal cyst.  . History of adenomatous polyp of colon   . History of subarachnoid hemorrhage 10/2014   after syncope and while on xarelto  . Hyperlipidemia   . Hypertension    Difficult to control, in the setting of one functioning kidney: pt was referred to nephrology by Dr. Gwenlyn Found 06/2015.  . Lumbar radiculopathy 2012  . Lumbar spondylosis    MR 07/2016---no sign of spinal nerve compression or cord compression.  Pt set up with outpt ortho while admitted to hosp 07/2016.  . Metatarsal fracture 06/10/2016   Nondisplaced, left 5th metatarsal--pt was referred to ortho  . Osteopenia 2014   T-score -2.1  . PAF (paroxysmal atrial fibrillation) (HCC)    Eliquis started after BRAO and CVA  . Stroke [facility])    cardioembolic (had CVA while on no anticoag)--"scattered subacute punctate infarcts: 1 in R parietal lobe and 2 in occipital cortex" on MRI br.  CT angio head/neck: aortic arch athero  . Thoracic compression fracture ([place]) 07/2016   T7 and T8-- T8 kyphoplasty during hosp admission 07/2016.   Past Surgical History:  Procedure Laterality Date  . APPENDECTOMY  child    . CARDIOVASCULAR STRESS TEST  01/31/2016   Stress myoview: NORMAL/Low risk.  EF 56%.  . [place]  . COLONOSCOPY  2015   + hx of adenomatous polyps.  Need digest health spec in [place] records to see when pt due for next colonoscopy  . IR GENERIC HISTORICAL  07/24/2016   IR KYPHO THORACIC WITH BONE BIOPSY 07/24/2016 Luanne Bras, MD [facility]  . KYPHOPLASTY  07/27/2016   T8  . Renal artery dopplers  02/26/2016   Her right renal dimension was 11 cm pole to pole with mild to moderate right renal artery stenosis. A right renal aortic ratio was 3.22 suggesting less than a 50% stenosis.  . TONSILLECTOMY    . TRANSTHORACIC ECHOCARDIOGRAM  01/29/2016   EF 55-60%, normal LV wall motion, grade I DD.  No cardiac source of emboli was seen.     Social History:  Ambulatory   Some times able to walk but for long distance only  Wheelchair     reports that she has never smoked. She has never used smokeless tobacco. She reports that she drinks about 0.6 oz of alcohol per week . She reports  that she does not use drugs.  Allergies:   Allergies  Allergen Reactions  . Clonidine Derivatives Palpitations and Other (See Comments)    Very sedated  . Codeine Nausea Only  . Nickel Rash  . Sulfa Antibiotics Other (See Comments)    Reaction unknown       Family History:   Family History  Problem Relation Age of Onset  . Heart disease Father   . Early death Father   . Sudden Cardiac Death Neg Hx     Medications: Prior to Admission medications   Medication Sig Start Date End Date Taking? Authorizing Provider  acetaminophen (TYLENOL) 500 MG tablet Take 500 mg by mouth every 6 (six) hours as needed for moderate pain.   Yes Historical Provider, MD  amLODipine (NORVASC) 5 MG tablet Take 1 tablet (5 mg total) by mouth 2 (two) times daily with breakfast and lunch. Patient taking differently: Take 5 mg by mouth 2 (two) times daily.  05/25/16  Yes Lorretta Harp, MD  apixaban  (ELIQUIS) 2.5 MG TABS tablet Take 1 tablet (2.5 mg total) by mouth 2 (two) times daily. 07/20/16  Yes Hosie Poisson, MD  atenolol-chlorthalidone (TENORETIC) 100-25 MG tablet Take 1 tablet by mouth every morning. 01/27/16  Yes Historical Provider, MD  Calcium Carb-Cholecalciferol (CALCIUM/VITAMIN D PO) Take 1 tablet by mouth 2 (two) times daily.   Yes Historical Provider, MD  furosemide (LASIX) 80 MG tablet Take 80 mg by mouth 2 (two) times daily.   Yes Historical Provider, MD  hydrALAZINE (APRESOLINE) 25 MG tablet Take 3 tablets (75 mg total) by mouth 3 (three) times daily. 07/20/16  Yes Hosie Poisson, MD  Magnesium Gluconate (MAGNESIUM 27) 500 (27 Mg) MG TABS Take 500 mg by mouth 4 (four) times a week.    Yes Historical Provider, MD  Multiple Vitamin (MULTIVITAMIN) tablet Take 1 tablet by mouth 3 (three) times a week.    Yes Historical Provider, MD  ondansetron (ZOFRAN) 4 MG tablet Take 0.5-1 tablets (2-4 mg total) by mouth every 8 (eight) hours as needed for nausea or vomiting. 07/30/16  Yes Tammi Sou, MD  oxyCODONE-acetaminophen (PERCOCET/ROXICET) 5-325 MG tablet Take 1-2 tablets by mouth every 6 (six) hours as needed for severe pain. 07/30/16  Yes Tammi Sou, MD  pravastatin (PRAVACHOL) 40 MG tablet Take 40 mg by mouth every evening. 07/16/15  Yes Historical Provider, MD  vitamin C (ASCORBIC ACID) 500 MG tablet Take 500 mg by mouth daily.   Yes Historical Provider, MD  methocarbamol (ROBAXIN) 750 MG tablet Take 1 tablet (750 mg total) by mouth every 8 (eight) hours as needed for muscle spasms. Patient not taking: Reported on 08/05/2016 07/20/16   Hosie Poisson, MD    Physical Exam: Patient Vitals for the past 24 hrs:  BP Temp Temp src Pulse Resp SpO2  08/05/16 1915 (!) 153/60 - - 69 - 95 %  08/05/16 1900 (!) 142/62 - - 71 - (!) 88 %  08/05/16 1846 (!) 161/62 - - 73 14 91 %  08/05/16 1845 (!) 161/62 - - 73 - (!) 89 %  08/05/16 1830 (!) 149/60 - - 73 - 90 %  08/05/16 1815 (!) 154/63 - - 70 -  90 %  08/05/16 1800 (!) 157/65 - - 70 - 92 %  08/05/16 1745 (!) 152/62 - - 70 - 93 %  08/05/16 1546 (!) 155/64 97.6 F (36.4 C) Oral 67 18 97 %    1. General:  in No Acute  distress 2. Psychological: Alert and   Oriented 3. Head/ENT:    Dry Mucous Membranes                          Head Non traumatic, neck supple                           Poor Dentition 4. SKIN:   decreased Skin turgor,  Skin clean Dry and intact no rash 5. Heart: Regular rate and rhythm no Murmur, Rub or gallop 6. Lungs:   no wheezes or crackles  some 7. Abdomen: Soft,  non-tender, Non distended 8. Lower extremities: no clubbing, cyanosis, 3+ edema 9. Neurologically   strength 5 out of 5 in all 4 extremities cranial nerves II through XII intact 10. MSK: Normal range of motion   body mass index is unknown because there is no height or weight on file.  Labs on Admission:   Labs on Admission: I have personally reviewed following labs and imaging studies  CBC:  Recent Labs Lab 08/05/16 1600  WBC 6.9  NEUTROABS 5.4  HGB 11.1*  HCT 35.5*  MCV 94.7  PLT 326   Basic Metabolic Panel:  Recent Labs Lab 08/05/16 1600  NA 135  K 4.3  CL 96*  CO2 27  GLUCOSE 115*  BUN 70*  CREATININE 2.98*  CALCIUM 8.8*   GFR: Estimated Creatinine Clearance: 12.9 mL/min (A) (by C-G formula based on SCr of 2.98 mg/dL (H)). Liver Function Tests:  Recent Labs Lab 08/05/16 1600  AST 28  ALT 14  ALKPHOS 64  BILITOT 0.3  PROT 6.0*  ALBUMIN 3.1*    Recent Labs Lab 08/05/16 1600  LIPASE 60*   No results for input(s): AMMONIA in the last 168 hours. Coagulation Profile: No results for input(s): INR, PROTIME in the last 168 hours. Cardiac Enzymes: No results for input(s): CKTOTAL, CKMB, CKMBINDEX, TROPONINI in the last 168 hours. BNP (last 3 results) No results for input(s): PROBNP in the last 8760 hours. HbA1C: No results for input(s): HGBA1C in the last 72 hours. CBG: No results for input(s): GLUCAP in the  last 168 hours. Lipid Profile: No results for input(s): CHOL, HDL, LDLCALC, TRIG, CHOLHDL, LDLDIRECT in the last 72 hours. Thyroid Function Tests: No results for input(s): TSH, T4TOTAL, FREET4, T3FREE, THYROIDAB in the last 72 hours. Anemia Panel: No results for input(s): VITAMINB12, FOLATE, FERRITIN, TIBC, IRON, RETICCTPCT in the last 72 hours.  Sepsis Labs: @LABRCNTIP (procalcitonin:4,lacticidven:4) )No results found for this or any previous visit (from the past 240 hour(s)).     UA ordered  Lab Results  Component Value Date   HGBA1C 5.6 01/29/2016    Estimated Creatinine Clearance: 12.9 mL/min (A) (by C-G formula based on SCr of 2.98 mg/dL (H)).  BNP (last 3 results) No results for input(s): PROBNP in the last 8760 hours.   ECG REPORT Not obtained  There were no vitals filed for this visit.   Cultures:    Component Value Date/Time   SDES URINE, RANDOM 07/11/2016 1105   SPECREQUEST NONE 07/11/2016 1105   CULT NO GROWTH 07/11/2016 1105   REPTSTATUS 07/12/2016 FINAL 07/11/2016 1105     Radiological Exams on Admission: Dg Thoracic Spine 2 View  Result Date: 08/05/2016 CLINICAL DATA:  Mid thoracic pain for 2 weeks, status post kyphoplasty EXAM: THORACIC SPINE 2 VIEWS COMPARISON:  07/22/2016 and 07/24/2016 FINDINGS: Three views of thoracic spine submitted. No acute fracture  or subluxation. Stable mild compression deformity T7 vertebral body. Again noted prior vertebroplasty and compression deformity T8 vertebral body. IMPRESSION: No acute fracture or subluxation. Stable mild compression deformity T7 vertebral body. Again noted prior vertebroplasty and compression deformity T8 vertebral body. Electronically Signed   By: Lahoma Crocker M.D.   On: 08/05/2016 17:48   Dg Abdomen Acute W/chest  Result Date: 08/05/2016 CLINICAL DATA:  Mid thoracic pain following T8 kyphoplasty 2 weeks ago. Nausea and vomiting for 1 day. EXAM: DG ABDOMEN ACUTE W/ 1V CHEST COMPARISON:  One-view chest  x-ray 07/24/2016 FINDINGS: The heart is enlarged. Aortic atherosclerosis is again noted. A left pleural effusion is present. Retrocardiac basilar airspace opacification is noted as well. Mild pulmonary vascular congestion is present without frank edema. The right lung is otherwise clear. Remote right-sided rib fractures are again seen. T8 vertebral augmentation is noted. Bowel gas pattern is unremarkable. There is no obstruction or free air. Gas and stool are present throughout a normal-sized colon. The stents atherosclerotic calcifications are present at distal aorta and branch vessels. The axial skeleton is unremarkable. IMPRESSION: 1. Cardiomegaly and moderate pulmonary vascular congestion without frank edema. 2. New left pleural effusion and associated airspace disease. While this likely reflects atelectasis, infection is not excluded. 3. Aortic atherosclerosis.  Branch vessel disease is noted as well. 4. Normal bowel gas pattern without evidence for obstruction or free air. Electronically Signed   By: San Morelle M.D.   On: 08/05/2016 17:49    Chart has been reviewed    Assessment/Plan  83 y.o. female with medical history significant of solitary kidney, CKD thoracic compression fracture, PAF hypertension hyperlipidemia, CVA osteoporosis admitted for a AKI dehydration vertigo chest pain, persistent back pain  Present on Admission: . CKD (chronic kidney disease), stage II with AKI discussed with nephrology was seen in consult in a.m. recommend this point observation hold off on aggressive fluid resuscitation . Essential hypertension . PAF (paroxysmal atrial fibrillation) (HCC) -             - CHA2DS2 vas score 5 : continue current anticoagulation with   Eliquis           -  Rate control:  Currently controlled with  atenolol   will continue        - Rhythm control: none   . Thoracic compression fracture (HCC) pain management PT OT . Nausea & vomiting vertigo - obtain CT head,  neurologically intact if persists Consider further imaging.  . Dehydration and decreased by mouth intake will have nutritional consult check a albumin hold Lasix but I will avoid aggressive fluid resuscitation Chest pain fairly atypical but given risk factors will cycle cardiac enzymes Her system leg edema would attempt to use TED hose, check Dopplers Other plan as per orders.  DVT prophylaxis:  Eliquis  Code Status:  FULL CODE  as per patient   Family Communication:   Family  at  Bedside  plan of care was discussed with  Daughter,  Disposition Plan:   likely will need placement for rehabilitation                                         Would benefit from PT/OT eval prior to DC   ordered                         Nutrition  consulted                          Consults called: Nephrology  Admission status:   inpatient   Level of care     tele           I have spent a total of 66 min on this admission   extra time was spent to discuss case with nephrology Dr. Tora Duck 08/05/2016, 9:07 PM    [facility]  Pager [phone]   after 2 AM please page floor coverage PA If 7AM-7PM, please contact the day team taking care of the patient  [email or web address]  Password [email or web address]

## 2016-08-05 NOTE — Telephone Encounter (Signed)
I was given info about this situation and I suggested that pt be taken to ED for evaluation of acute vomiting illness with severe generalized weakness.

## 2016-08-05 NOTE — ED Notes (Signed)
Pt brought back from xray and connected to continuous pulse ox and BP cuff.

## 2016-08-05 NOTE — Progress Notes (Deleted)
OFFICE VISIT  08/05/2016   CC: No chief complaint on file.    HPI:    Patient is a 83 y.o. Caucasian female who presents for 1 week f/u pain due to osteoporotic compression fractures at T7 and T8. Last visit we changed her from vicodin to percocet 5/325.  Past Medical History:  Diagnosis Date  . Atrophy of left kidney    with absent blood flow by renal artery dopplers (Dr. Gwenlyn Found)  . 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 Chronic renal insufficiency, stage 2 (mild)    GFR @ 60.  01/2016 renal u/s showed atrophic/hypoplastic left kidney.  No hydronephrosis.  Small right renal cyst.  . History of adenomatous polyp of colon   . History of subarachnoid hemorrhage 10/2014   after syncope and while on xarelto  . Hyperlipidemia   . Hypertension    Difficult to control, in the setting of one functioning kidney: pt was referred to nephrology by Dr. Gwenlyn Found 06/2015.  . Lumbar radiculopathy 2012  . Lumbar spondylosis    MR 07/2016---no sign of spinal nerve compression or cord compression.  Pt set up with outpt ortho while admitted to hosp 07/2016.  . Metatarsal fracture 06/10/2016   Nondisplaced, left 5th metatarsal--pt was referred to ortho  . Osteopenia 2014   T-score -2.1  . PAF (paroxysmal atrial fibrillation) (HCC)    Eliquis started after BRAO and CVA  . Stroke [facility])    cardioembolic (had CVA while on no anticoag)--"scattered subacute punctate infarcts: 1 in R parietal lobe and 2 in occipital cortex" on MRI br.  CT angio head/neck: aortic arch athero  . Thoracic compression fracture ([place]) 07/2016   T7 and T8-- T8 kyphoplasty during hosp admission 07/2016.    Past Surgical History:  Procedure Laterality Date  . APPENDECTOMY  child  . CARDIOVASCULAR STRESS TEST  01/31/2016   Stress myoview: NORMAL/Low risk.  EF 56%.  . [place]  . COLONOSCOPY  2015   + hx of adenomatous polyps.  Need digest health spec in [place] records to see when pt due for next  colonoscopy  . IR GENERIC HISTORICAL  07/24/2016   IR KYPHO THORACIC WITH BONE BIOPSY 07/24/2016 Luanne Bras, MD [facility]  . KYPHOPLASTY  07/27/2016   T8  . Renal artery dopplers  02/26/2016   Her right renal dimension was 11 cm pole to pole with mild to moderate right renal artery stenosis. A right renal aortic ratio was 3.22 suggesting less than a 50% stenosis.  . TONSILLECTOMY    . TRANSTHORACIC ECHOCARDIOGRAM  01/29/2016   EF 55-60%, normal LV wall motion, grade I DD.  No cardiac source of emboli was seen.    Outpatient Medications Prior to Visit  Medication Sig Dispense Refill  . acetaminophen (TYLENOL) 500 MG tablet Take 500 mg by mouth every 6 (six) hours as needed for moderate pain.    [name] amLODipine (NORVASC) 5 MG tablet Take 1 tablet (5 mg total) by mouth 2 (two) times daily with breakfast and lunch. 180 tablet 3  . apixaban (ELIQUIS) 2.5 MG TABS tablet Take 1 tablet (2.5 mg total) by mouth 2 (two) times daily. 60 tablet 0  . atenolol-chlorthalidone (TENORETIC) 100-25 MG tablet Take 1 tablet by mouth every morning.    . Calcium Carb-Cholecalciferol (CALCIUM/VITAMIN D PO) Take 1 tablet by mouth 2 (two) times daily.    . furosemide (LASIX) 80 MG tablet Take 80 mg by mouth 2 (two) times daily.    [name]  hydrALAZINE (APRESOLINE) 25 MG tablet Take 3 tablets (75 mg total) by mouth 3 (three) times daily.    . Magnesium Gluconate (MAGNESIUM 27) 500 (27 Mg) MG TABS Take 500 mg by mouth 4 (four) times a week.     . methocarbamol (ROBAXIN) 750 MG tablet Take 1 tablet (750 mg total) by mouth every 8 (eight) hours as needed for muscle spasms. 15 tablet 0  . Multiple Vitamin (MULTIVITAMIN) tablet Take 1 tablet by mouth 3 (three) times a week.     . ondansetron (ZOFRAN) 4 MG tablet Take 0.5-1 tablets (2-4 mg total) by mouth every 8 (eight) hours as needed for nausea or vomiting. 30 tablet 3  . oxyCODONE-acetaminophen (PERCOCET/ROXICET) 5-325 MG tablet Take 1-2 tablets by mouth every 6 (six) hours  as needed for severe pain. 60 tablet 0  . pravastatin (PRAVACHOL) 40 MG tablet Take 40 mg by mouth every evening.    . vitamin C (ASCORBIC ACID) 500 MG tablet Take 500 mg by mouth daily.     No facility-administered medications prior to visit.     Allergies  Allergen Reactions  . Clonidine Derivatives Palpitations and Other (See Comments)    Very sedated  . Codeine Nausea Only  . Nickel Rash  . Sulfa Antibiotics Other (See Comments)    Reaction unknown    ROS As per HPI  PE: There were no vitals taken for this visit. ***  LABS:  ***  IMPRESSION AND PLAN:  No problem-specific Assessment & Plan notes found for this encounter.   FOLLOW UP: No Follow-up on file.

## 2016-08-05 NOTE — Telephone Encounter (Signed)
Patient came in for OV however was so weak from vomiting could not assist patient's daughter to get in wheelchair from car. Patient's daughter was advised to take patient to ER to receive IV fluids.

## 2016-08-06 ENCOUNTER — Inpatient Hospital Stay (HOSPITAL_COMMUNITY): Payer: Medicare Other

## 2016-08-06 ENCOUNTER — Other Ambulatory Visit: Payer: Self-pay

## 2016-08-06 DIAGNOSIS — N179 Acute kidney failure, unspecified: Principal | ICD-10-CM

## 2016-08-06 DIAGNOSIS — M7989 Other specified soft tissue disorders: Secondary | ICD-10-CM

## 2016-08-06 DIAGNOSIS — M79609 Pain in unspecified limb: Secondary | ICD-10-CM

## 2016-08-06 LAB — CBC
HCT: 31.3 % — ABNORMAL LOW (ref 36.0–46.0)
HEMOGLOBIN: 10 g/dL — AB (ref 12.0–15.0)
MCH: 30.4 pg (ref 26.0–34.0)
MCHC: 31.9 g/dL (ref 30.0–36.0)
MCV: 95.1 fL (ref 78.0–100.0)
Platelets: 309 10*3/uL (ref 150–400)
RBC: 3.29 MIL/uL — AB (ref 3.87–5.11)
RDW: 15.7 % — ABNORMAL HIGH (ref 11.5–15.5)
WBC: 4.9 10*3/uL (ref 4.0–10.5)

## 2016-08-06 LAB — COMPREHENSIVE METABOLIC PANEL
ALT: 14 U/L (ref 14–54)
ANION GAP: 10 (ref 5–15)
AST: 24 U/L (ref 15–41)
Albumin: 2.5 g/dL — ABNORMAL LOW (ref 3.5–5.0)
Alkaline Phosphatase: 60 U/L (ref 38–126)
BUN: 63 mg/dL — ABNORMAL HIGH (ref 6–20)
CHLORIDE: 98 mmol/L — AB (ref 101–111)
CO2: 29 mmol/L (ref 22–32)
Calcium: 8.4 mg/dL — ABNORMAL LOW (ref 8.9–10.3)
Creatinine, Ser: 2.58 mg/dL — ABNORMAL HIGH (ref 0.44–1.00)
GFR, EST AFRICAN AMERICAN: 19 mL/min — AB (ref >60)
GFR, EST NON AFRICAN AMERICAN: 16 mL/min — AB (ref >60)
Glucose, Bld: 99 mg/dL (ref 65–99)
POTASSIUM: 3.8 mmol/L (ref 3.5–5.1)
SODIUM: 137 mmol/L (ref 135–145)
Total Bilirubin: 0.6 mg/dL (ref 0.3–1.2)
Total Protein: 5.1 g/dL — ABNORMAL LOW (ref 6.5–8.1)

## 2016-08-06 LAB — PREALBUMIN: Prealbumin: 22.7 mg/dL (ref 18–38)

## 2016-08-06 LAB — IRON AND TIBC
IRON: 47 ug/dL (ref 28–170)
Saturation Ratios: 21 % (ref 10.4–31.8)
TIBC: 228 ug/dL — ABNORMAL LOW (ref 250–450)
UIBC: 181 ug/dL

## 2016-08-06 LAB — LIPASE, BLOOD: Lipase: 42 U/L (ref 11–51)

## 2016-08-06 LAB — TSH: TSH: 2.078 u[IU]/mL (ref 0.350–4.500)

## 2016-08-06 LAB — PHOSPHORUS: PHOSPHORUS: 4.7 mg/dL — AB (ref 2.5–4.6)

## 2016-08-06 LAB — URINALYSIS, ROUTINE W REFLEX MICROSCOPIC
BACTERIA UA: NONE SEEN
Bilirubin Urine: NEGATIVE
GLUCOSE, UA: NEGATIVE mg/dL
HGB URINE DIPSTICK: NEGATIVE
KETONES UR: NEGATIVE mg/dL
Nitrite: NEGATIVE
PH: 6 (ref 5.0–8.0)
PROTEIN: NEGATIVE mg/dL
Specific Gravity, Urine: 1.01 (ref 1.005–1.030)
Squamous Epithelial / LPF: NONE SEEN

## 2016-08-06 LAB — FERRITIN: Ferritin: 78 ng/mL (ref 11–307)

## 2016-08-06 LAB — MAGNESIUM: MAGNESIUM: 1.7 mg/dL (ref 1.7–2.4)

## 2016-08-06 LAB — TROPONIN I

## 2016-08-06 LAB — SODIUM, URINE, RANDOM: Sodium, Ur: 86 mmol/L

## 2016-08-06 LAB — CREATININE, URINE, RANDOM: Creatinine, Urine: 36.57 mg/dL

## 2016-08-06 MED ORDER — SENNOSIDES-DOCUSATE SODIUM 8.6-50 MG PO TABS
1.0000 | ORAL_TABLET | Freq: Two times a day (BID) | ORAL | Status: DC
  Administered 2016-08-06 – 2016-08-08 (×4): 1 via ORAL
  Filled 2016-08-06 (×4): qty 1

## 2016-08-06 MED ORDER — POLYETHYLENE GLYCOL 3350 17 G PO PACK
17.0000 g | PACK | Freq: Every day | ORAL | Status: DC
  Administered 2016-08-06 – 2016-08-08 (×3): 17 g via ORAL
  Filled 2016-08-06 (×3): qty 1

## 2016-08-06 MED ORDER — HYDRALAZINE HCL 50 MG PO TABS
75.0000 mg | ORAL_TABLET | Freq: Three times a day (TID) | ORAL | Status: DC
  Administered 2016-08-06 – 2016-08-08 (×7): 75 mg via ORAL
  Filled 2016-08-06 (×7): qty 1

## 2016-08-06 MED ORDER — NEPRO/CARBSTEADY PO LIQD
237.0000 mL | Freq: Two times a day (BID) | ORAL | Status: DC
  Administered 2016-08-07 (×2): 237 mL via ORAL

## 2016-08-06 MED ORDER — FUROSEMIDE 80 MG PO TABS
80.0000 mg | ORAL_TABLET | Freq: Every day | ORAL | Status: DC
  Administered 2016-08-06 – 2016-08-08 (×3): 80 mg via ORAL
  Filled 2016-08-06 (×3): qty 1

## 2016-08-06 NOTE — Evaluation (Signed)
Physical Therapy Evaluation Patient Details Name: Valerie Terrell MRN: [id number] DOB: 09-19-1932 Today's Date: 08/06/2016   History of Present Illness  83 y.o. female with medical history significant of solitary kidney, CKD, thoracic compression fracture (T8 kyphoplasty on 3/9), PAF, hypertension, hyperlipidemia, CVA, osteoporosis. Admitted to ED for nausea and vomiting per recommendation of pt's MD.   Clinical Impression  Pt admitted with/for n/v and with fluid overload.  Pt generally at a min guard level of functioning for basic mobility and gait.  Pt currently limited functionally due to the problems listed below.  (see problems list.)  Pt will benefit from PT to maximize function and safety to be able to get home safely with available assist.     Follow Up Recommendations Home health PT;Supervision for mobility/OOB    Equipment Recommendations       Recommendations for Other Services       Precautions / Restrictions Precautions Precautions: Fall Restrictions Weight Bearing Restrictions: No      Mobility  Bed Mobility Overal bed mobility: Needs Assistance Bed Mobility: Supine to Sit     Supine to sit: Min guard;HOB elevated        Transfers Overall transfer level: Needs assistance Equipment used: None Transfers: Sit to/from Stand Sit to Stand: Min guard            Ambulation/Gait Ambulation/Gait assistance: Min guard Ambulation Distance (Feet): 90 Feet Assistive device: None Gait Pattern/deviations: Step-through pattern Gait velocity: slower Gait velocity interpretation: Below normal speed for age/gender General Gait Details: generally steady with no AD.  slower and slightly wider BOS due to edematous legs.  Stairs            Wheelchair Mobility    Modified Rankin (Stroke Patients Only)       Balance Overall balance assessment: Needs assistance Sitting-balance support: Feet supported;No upper extremity supported Sitting balance-Leahy  Scale: Good     Standing balance support: No upper extremity supported;During functional activity Standing balance-Leahy Scale: Fair                               Pertinent Vitals/Pain Pain Assessment: Faces Faces Pain Scale: Hurts a little bit Pain Location: Back Pain Descriptors / Indicators: Sore    Home Living Family/patient expects to be discharged to:: Private residence Living Arrangements: Children Available Help at Discharge: Family;Available 24 hours/day Type of Home: House Home Access: Stairs to enter Entrance Stairs-Rails: None Entrance Stairs-Number of Steps: 3-4 Home Layout: One level Home Equipment: Walker - 2 wheels;Cane - quad;Bedside commode Additional Comments: Pt sponges baths with BSC at sink    Prior Function Level of Independence: Needs assistance   Gait / Transfers Assistance Needed: Sometimes uses cane or walker when feeling unsteady  ADL's / Homemaking Assistance Needed: Daughter helps with socks and IADLs  Comments: Pt reports that she grocery shops, drives, and mows lawn     Hand Dominance   Dominant Hand: Right    Extremity/Trunk Assessment        Lower Extremity Assessment Lower Extremity Assessment: Overall WFL for tasks assessed (proximal weakness) RLE Deficits / Details: Edema, tenderness, and generalized weakness RLE Sensation:  (intact) LLE Deficits / Details: Edema, tenderness, and generalized weakness LLE Sensation:  (intact)    Cervical / Trunk Assessment Cervical / Trunk Assessment: Normal  Communication   Communication: No difficulties  Cognition Arousal/Alertness: Awake/alert Behavior During Therapy: WFL for tasks assessed/performed Overall Cognitive Status: Within Functional Limits for  tasks assessed                      General Comments      Exercises     Assessment/Plan    PT Assessment Patient needs continued PT services  PT Problem List Decreased strength;Decreased activity  tolerance;Decreased balance;Decreased mobility;Pain       PT Treatment Interventions Gait training;DME instruction;Stair training;Functional mobility training;Therapeutic activities;Patient/family education    PT Goals (Current goals can be found in the Care Plan section)  Acute Rehab PT Goals Patient Stated Goal: Feel better PT Goal Formulation: With patient Time For Goal Achievement: 08/20/16 Potential to Achieve Goals: Good    Frequency Min 3X/week   Barriers to discharge        Co-evaluation               End of Session   Activity Tolerance: Patient tolerated treatment well Patient left: in bed;with call bell/phone within reach Nurse Communication: Mobility status PT Visit Diagnosis: Unsteadiness on feet (R26.81)         Time: 8916-9450 PT Time Calculation (min) (ACUTE ONLY): 22 min   Charges:   PT Evaluation $PT Eval Moderate Complexity: 1 Procedure     PT G CodesTessie Fass Gizel Riedlinger 08/06/2016, 5:01 PM 08/06/2016  Donnella Sham, PT [phone]  (pager)

## 2016-08-06 NOTE — Discharge Instructions (Addendum)
Acute Kidney Injury, Adult Acute kidney injury is a sudden worsening of kidney function. The kidneys are organs that have several jobs. They filter the blood to remove waste products and extra fluid. They also maintain a healthy balance of minerals and hormones in the body, which helps control blood pressure and keep bones strong. With this condition, your kidneys do not do their jobs as well as they should. This condition ranges from mild to severe. Over time it may develop into long-lasting (chronic) kidney disease. Early detection and treatment may prevent acute kidney injury from developing into a chronic condition. What are the causes? Common causes of this condition include:  A problem with blood flow to the kidneys. This may be caused by:  Low blood pressure (hypotension) or shock.  Blood loss.  Heart and blood vessel (cardiovascular) disease.  Severe burns.  Liver disease.  Direct damage to the kidneys. This may be caused by:  Certain medicines.  A kidney infection.  Poisoning.  Being around or in contact with toxic substances.  A surgical wound.  A hard, direct hit to the kidney area.  A sudden blockage of urine flow. This may be caused by:  Cancer.  Kidney stones.  An enlarged prostate in males. What are the signs or symptoms? Symptoms of this condition may not be obvious until the condition becomes severe. Symptoms of this condition can include:  Tiredness (lethargy), or difficulty staying awake.  Nausea or vomiting.  Swelling (edema) of the face, legs, ankles, or feet.  Problems with urination, such as:  Abdominal pain, or pain along the side of your stomach (flank).  Decreased urine production.  Decrease in the force of urine flow.  Muscle twitches and cramps, especially in the legs.  Confusion or trouble concentrating.  Loss of appetite.  Fever. How is this diagnosed? This condition may be diagnosed with tests, including:  Blood  tests.  Urine tests.  Imaging tests.  A test in which a sample of tissue is removed from the kidneys to be examined under a microscope (kidney biopsy). How is this treated? Treatment for this condition depends on the cause and how severe the condition is. In mild cases, treatment may not be needed. The kidneys may heal on their own. In more severe cases, treatment will involve:  Treating the cause of the kidney injury. This may involve changing any medicines you are taking or adjusting your dosage.  Fluids. You may need specialized IV fluids to balance your body's needs.  Having a catheter placed to drain urine and prevent blockages.  Preventing problems from occurring. This may mean avoiding certain medicines or procedures that can cause further injury to the kidneys. In some cases treatment may also require:  A procedure to remove toxic wastes from the body (dialysis or continuous renal replacement therapy - CRRT).  Surgery. This may be done to repair a torn kidney, or to remove the blockage from the urinary system. Follow these instructions at home: Medicines   Take over-the-counter and prescription medicines only as told by your health care provider.  Do not take any new medicines without your health care provider's approval. Many medicines can worsen your kidney damage.  Do not take any vitamin and mineral supplements without your health care provider's approval. Many nutritional supplements can worsen your kidney damage. Lifestyle   If your health care provider prescribed changes to your diet, follow them. You may need to decrease the amount of protein you eat.  Achieve and maintain a  healthy weight. If you need help with this, ask your health care provider.  Start or continue an exercise plan. Try to exercise at least 30 minutes a day, 5 days a week.  Do not use any tobacco products, such as cigarettes, chewing tobacco, and e-cigarettes. If you need help quitting, ask  your health care provider. General instructions   Keep track of your blood pressure. Report changes in your blood pressure as told by your health care provider.  Stay up to date with immunizations. Ask your health care provider which immunizations you need.  Keep all follow-up visits as told by your health care provider. This is important. Where to find more information:  American Association of Kidney Patients: BombTimer.gl  National Kidney Foundation: www.kidney.[place]: https://mathis.com/  [facility]:  www.lifeoptions.org  www.kidneyschool.org Contact a health care provider if:  Your symptoms get worse.  You develop new symptoms. Get help right away if:  You develop symptoms of worsening kidney disease, which include:  Headaches.  Abnormally dark or light skin.  Easy bruising.  Frequent hiccups.  Chest pain.  Shortness of breath.  End of menstruation in women.  Seizures.  Confusion or altered mental status.  Abdominal or back pain.  Itchiness.  You have a fever.  Your body is producing less urine.  You have pain or bleeding when you urinate. Summary  Acute kidney injury is a sudden worsening of kidney function.  Acute kidney injury can be caused by problems with blood flow to the kidneys, direct damage to the kidneys, and sudden blockage of urine flow.  Symptoms of this condition may not be obvious until it becomes severe. Symptoms may include edema, lethargy, confusion, nausea or vomiting, and problems passing urine.  This condition can usually be diagnosed with blood tests, urine tests, and imaging tests. Sometimes a kidney biopsy is done to diagnose this condition.  Treatment for this condition often involves treating the underlying cause. It is treated with fluids, medicines, dialysis, diet changes, or surgery. This information is not intended to replace advice given to you by your health care provider.  Make sure you discuss any questions you have with your health care provider. Document Released: 11/17/2010 Document Revised: 04/24/2016 Document Reviewed: 04/24/2016 Elsevier Interactive Patient Education  2017 [place] on my medicine - ELIQUIS (apixaban)  This medication education was reviewed with me or my healthcare representative as part of my discharge preparation.    Why was Eliquis prescribed for you? Eliquis was prescribed for you to reduce the risk of a blood clot forming that can cause a stroke if you have a medical condition called atrial fibrillation (a type of irregular heartbeat).  What do You need to know about Eliquis ? Take your Eliquis TWICE DAILY - one tablet in the morning and one tablet in the evening with or without food. If you have difficulty swallowing the tablet whole please discuss with your pharmacist how to take the medication safely.  Take Eliquis exactly as prescribed by your doctor and DO NOT stop taking Eliquis without talking to the doctor who prescribed the medication.  Stopping may increase your risk of developing a stroke.  Refill your prescription before you run out.  After discharge, you should have regular check-up appointments with your healthcare provider that is prescribing your Eliquis.  In the future your dose may need to be changed if your kidney function or weight changes by a significant amount or as you get older.  What do you do if you miss a dose? If you miss a dose, take it as soon as you remember on the same day and resume taking twice daily.  Do not take more than one dose of ELIQUIS at the same time to make up a missed dose.  Important Safety Information A possible side effect of Eliquis is bleeding. You should call your healthcare provider right away if you experience any of the following: ? Bleeding from an injury or your nose that does not stop. ? Unusual colored urine (red or dark brown) or unusual colored  stools (red or black). ? Unusual bruising for unknown reasons. ? A serious fall or if you hit your head (even if there is no bleeding).  Some medicines may interact with Eliquis and might increase your risk of bleeding or clotting while on Eliquis. To help avoid this, consult your healthcare provider or pharmacist prior to using any new prescription or non-prescription medications, including herbals, vitamins, non-steroidal anti-inflammatory drugs (NSAIDs) and supplements.  This website has more information on Eliquis (apixaban): http://www.eliquis.com/eliquis/home

## 2016-08-06 NOTE — Progress Notes (Signed)
*  Preliminary Results* Bilateral lower extremity venous duplex completed. There is no obvious evidence of deep vein thrombosis involving visualized bilateral lower extremity veins.  08/06/2016 3:03 PM Maudry Mayhew, BS, RVT, RDCS, RDMS

## 2016-08-06 NOTE — Consult Note (Addendum)
Valerie Terrell is an 83 y.o. female referred by Dr Doyle Askew   Chief Complaint: Acute on CKD 3 HPI: 83yo WF with CKD 3 seconary to HTN and solitary fx Rt kidney (Lt atrophic) admitted yest for back pain, N/V. Saw Dr Florene Glen 3/14 and had labs done that showed Scr 3.1.  Yest Scr was 2.98 and today 2.58.  Was on lisinopril but this was stopped last week when Scr increased to 3.1.  She has developed significant edema over the last 2 months, ? If related to IV fluids from several hospitalizations.  She is on amlodipine but tells me she has been at the current dose for a couple of years.  Her hydralazine dose was increased last fall and the only diuretic she was on was chlorthalidone in the tenoretic.  Denies any NSAID use.  She has not been on a fluid restricted diet at home.  Past Medical History:  Diagnosis Date  . Atrophy of left kidney    with absent blood flow by renal artery dopplers (Dr. Gwenlyn Found)  . 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 Chronic renal insufficiency, stage 2 (mild)    GFR @ 60.  01/2016 renal u/s showed atrophic/hypoplastic left kidney.  No hydronephrosis.  Small right renal cyst.  . History of adenomatous polyp of colon   . History of subarachnoid hemorrhage 10/2014   after syncope and while on xarelto  . Hyperlipidemia   . Hypertension    Difficult to control, in the setting of one functioning kidney: pt was referred to nephrology by Dr. Gwenlyn Found 06/2015.  . Lumbar radiculopathy 2012  . Lumbar spondylosis    MR 07/2016---no sign of spinal nerve compression or cord compression.  Pt set up with outpt ortho while admitted to hosp 07/2016.  . Metatarsal fracture 06/10/2016   Nondisplaced, left 5th metatarsal--pt was referred to ortho  . Osteopenia 2014   T-score -2.1  . PAF (paroxysmal atrial fibrillation) (HCC)    Eliquis started after BRAO and CVA  . Stroke [facility])    cardioembolic (had CVA while on no anticoag)--"scattered subacute punctate infarcts: 1 in R parietal lobe and  2 in occipital cortex" on MRI br.  CT angio head/neck: aortic arch athero  . Thoracic compression fracture ([place]) 07/2016   T7 and T8-- T8 kyphoplasty during hosp admission 07/2016.    Past Surgical History:  Procedure Laterality Date  . APPENDECTOMY  child  . CARDIOVASCULAR STRESS TEST  01/31/2016   Stress myoview: NORMAL/Low risk.  EF 56%.  . [place]  . COLONOSCOPY  2015   + hx of adenomatous polyps.  Need digest health spec in [facility] records to see when pt due for next colonoscopy  . IR GENERIC HISTORICAL  07/24/2016   IR KYPHO THORACIC WITH BONE BIOPSY 07/24/2016 Luanne Bras, MD [facility]  . KYPHOPLASTY  07/27/2016   T8  . Renal artery dopplers  02/26/2016   Her right renal dimension was 11 cm pole to pole with mild to moderate right renal artery stenosis. A right renal aortic ratio was 3.22 suggesting less than a 50% stenosis.  . TONSILLECTOMY    . TRANSTHORACIC ECHOCARDIOGRAM  01/29/2016   EF 55-60%, normal LV wall motion, grade I DD.  No cardiac source of emboli was seen.    Family History  Problem Relation Age of Onset  . Heart disease Father   . Early death Father   . Sudden Cardiac Death Neg Hx   Daughter with atrophic  kidney  Social History:  reports that she has never smoked. She has never used smokeless tobacco. She reports that she drinks about 0.6 oz of alcohol per week . She reports that she does not use drugs. Daughter lives with her  Allergies:  Allergies  Allergen Reactions  . Clonidine Derivatives Palpitations and Other (See Comments)    Very sedated  . Codeine Nausea Only  . Nickel Rash  . Sulfa Antibiotics Other (See Comments)    Reaction unknown    Medications Prior to Admission  Medication Sig Dispense Refill  . acetaminophen (TYLENOL) 500 MG tablet Take 500 mg by mouth every 6 (six) hours as needed for moderate pain.    [name] amLODipine (NORVASC) 5 MG tablet Take 1 tablet (5 mg total) by mouth 2 (two) times daily with breakfast  and lunch. (Patient taking differently: Take 5 mg by mouth 2 (two) times daily. ) 180 tablet 3  . apixaban (ELIQUIS) 2.5 MG TABS tablet Take 1 tablet (2.5 mg total) by mouth 2 (two) times daily. 60 tablet 0  . atenolol-chlorthalidone (TENORETIC) 100-25 MG tablet Take 1 tablet by mouth every morning.    . Calcium Carb-Cholecalciferol (CALCIUM/VITAMIN D PO) Take 1 tablet by mouth 2 (two) times daily.    . furosemide (LASIX) 80 MG tablet Take 80 mg by mouth 2 (two) times daily.    . hydrALAZINE (APRESOLINE) 25 MG tablet Take 3 tablets (75 mg total) by mouth 3 (three) times daily.    . Magnesium Gluconate (MAGNESIUM 27) 500 (27 Mg) MG TABS Take 500 mg by mouth 4 (four) times a week.     . Multiple Vitamin (MULTIVITAMIN) tablet Take 1 tablet by mouth 3 (three) times a week.     . ondansetron (ZOFRAN) 4 MG tablet Take 0.5-1 tablets (2-4 mg total) by mouth every 8 (eight) hours as needed for nausea or vomiting. 30 tablet 3  . oxyCODONE-acetaminophen (PERCOCET/ROXICET) 5-325 MG tablet Take 1-2 tablets by mouth every 6 (six) hours as needed for severe pain. 60 tablet 0  . pravastatin (PRAVACHOL) 40 MG tablet Take 40 mg by mouth every evening.    . vitamin C (ASCORBIC ACID) 500 MG tablet Take 500 mg by mouth daily.    . methocarbamol (ROBAXIN) 750 MG tablet Take 1 tablet (750 mg total) by mouth every 8 (eight) hours as needed for muscle spasms. (Patient not taking: Reported on 08/05/2016) 15 tablet 0     Lab Results: UA: No protein, 0-5 rbc and wbc  Recent Labs  08/05/16 1600 08/06/16 0813  WBC 6.9 4.9  HGB 11.1* 10.0*  HCT 35.5* 31.3*  PLT 354 309   BMET  Recent Labs  08/05/16 1600 08/06/16 0813  NA 135 137  K 4.3 3.8  CL 96* 98*  CO2 27 29  GLUCOSE 115* 99  BUN 70* 63*  CREATININE 2.98* 2.58*  CALCIUM 8.8* 8.4*  PHOS  --  4.7*   LFT  Recent Labs  08/06/16 0813  PROT 5.1*  ALBUMIN 2.5*  AST 24  ALT 14  ALKPHOS 60  BILITOT 0.6   Dg Thoracic Spine 2 View  Result Date:  08/05/2016 CLINICAL DATA:  Mid thoracic pain for 2 weeks, status post kyphoplasty EXAM: THORACIC SPINE 2 VIEWS COMPARISON:  07/22/2016 and 07/24/2016 FINDINGS: Three views of thoracic spine submitted. No acute fracture or subluxation. Stable mild compression deformity T7 vertebral body. Again noted prior vertebroplasty and compression deformity T8 vertebral body. IMPRESSION: No acute fracture or subluxation. Stable mild  compression deformity T7 vertebral body. Again noted prior vertebroplasty and compression deformity T8 vertebral body. Electronically Signed   By: Lahoma Crocker M.D.   On: 08/05/2016 17:48   Ct Head Wo Contrast  Result Date: 08/05/2016 CLINICAL DATA:  Vertigo EXAM: CT HEAD WITHOUT CONTRAST TECHNIQUE: Contiguous axial images were obtained from the base of the skull through the vertex without intravenous contrast. COMPARISON:  MRI 02/02/2016, CT brain 01/29/2016 FINDINGS: Brain: No acute territorial infarction, intracranial hemorrhage or focal mass lesion is visualized. There is mild to moderate atrophy. Mild periventricular, subcortical and deep white matter small vessel ischemic changes. Stable ventricle size. Vascular: No hyperdense vessels. Carotid artery calcifications. Vertebral artery calcifications. Skull: No suspicious bone lesion.  No fracture. Sinuses/Orbits: Minimal mucosal thickening in the ethmoid sinuses. No acute orbital abnormality. Other: None IMPRESSION: No definite CT evidence for acute intracranial abnormality. Small vessel ischemic changes of the white matter and atrophy. Electronically Signed   By: Donavan Foil M.D.   On: 08/05/2016 22:12   Dg Abdomen Acute W/chest  Result Date: 08/05/2016 CLINICAL DATA:  Mid thoracic pain following T8 kyphoplasty 2 weeks ago. Nausea and vomiting for 1 day. EXAM: DG ABDOMEN ACUTE W/ 1V CHEST COMPARISON:  One-view chest x-ray 07/24/2016 FINDINGS: The heart is enlarged. Aortic atherosclerosis is again noted. A left pleural effusion is  present. Retrocardiac basilar airspace opacification is noted as well. Mild pulmonary vascular congestion is present without frank edema. The right lung is otherwise clear. Remote right-sided rib fractures are again seen. T8 vertebral augmentation is noted. Bowel gas pattern is unremarkable. There is no obstruction or free air. Gas and stool are present throughout a normal-sized colon. The stents atherosclerotic calcifications are present at distal aorta and branch vessels. The axial skeleton is unremarkable. IMPRESSION: 1. Cardiomegaly and moderate pulmonary vascular congestion without frank edema. 2. New left pleural effusion and associated airspace disease. While this likely reflects atelectasis, infection is not excluded. 3. Aortic atherosclerosis.  Branch vessel disease is noted as well. 4. Normal bowel gas pattern without evidence for obstruction or free air. Electronically Signed   By: San Morelle M.D.   On: 08/05/2016 17:49    ROS: No change in vision Some DOE No CP No abd pain + constipation + edema No new arthritic CO No new neuropathic sx + back pain No dysuria  PHYSICAL EXAM: Blood pressure (!) 153/50, pulse 74, temperature 98.3 F (36.8 C), temperature source Oral, resp. rate 18, SpO2 92 %. HEENT: PERRLA EOMI NECK:Mild JVD LUNGS:Sl decreased BS bases L>R, few basilar crackles CARDIAC:RRR wo MRG ABD:+ BS NTND No HSM  Bladder not palpable EXT:3+ edema up to thighs NEURO:CNI Ox3 no asterixis  Assessment: 1. Acute on CKD 3 2. Recent thoracic comp fx 3. HTN, see how responds to volume control 4. Hx A fib, now NSR 5. anemia PLAN: 1. Resume lasix 80mg  BID and adjust from there based on response 2. Stay off ACE/ARB  3. Renal diet 4. DC chlorthalidone 5. Daily labs 6. I/O's 7. Check iron studies  Aicha Clingenpeel T 08/06/2016, 1:17 PM

## 2016-08-06 NOTE — Progress Notes (Signed)
Initial Nutrition Assessment  DOCUMENTATION CODES:   Not applicable  INTERVENTION:  - Nepro shake po BID, each supplement provides 425 calories and 19 grams of protein - RD to monitor and follow-up and assess for further nutritional interventions  NUTRITION DIAGNOSIS:   Increased nutrient needs related to acute illness as evidenced by estimated needs.  GOAL:   Patient will meet greater than or equal to 90% of their needs  MONITOR:   PO intake, Supplement acceptance, Labs, Weight trends, I & O's  REASON FOR ASSESSMENT:   Consult Assessment of nutrition requirement/status  ASSESSMENT:   83 y.o. female with PMH of solitary kidney, CKD thoracic compression fracture, PAF hypertension hyperlipidemia, CVA osteoporosis presented with N/V, abdominal cramping, and occasional chills. Patient was admitted 07/11/16 for acute on chronic renal failure and was readmitted 07/22/16 for continued back pain   Pt was being taken to vascular laboratory at attempted time of visit. Spoke with RN, pt has only been consuming a few bites of food throughout the day. RN unaware of how pt was eating PTA.   Per chart review pt has had recent upward trend of weight gain   Wt Readings from Last 10 Encounters:  07/30/16 152 lb 12 oz (69.3 kg)  07/23/16 153 lb 9.6 oz (69.7 kg)  07/19/16 150 lb 5.7 oz (68.2 kg)  06/24/16 140 lb 6.4 oz (63.7 kg)  06/16/16 139 lb 8 oz (63.3 kg)  06/10/16 141 lb (64 kg)  05/01/16 145 lb (65.8 kg)  03/31/16 150 lb 3.2 oz (68.1 kg)  03/23/16 149 lb 1.9 oz (67.6 kg)  03/13/16 149 lb 3.2 oz (67.7 kg)   No meal completion percentages available for this admission thus far. Per recent prior admission pt was consuming a fair percentage of meals.  Reviewed RD notes from prior admissions pt received nutritional supplements. Will continue appropriate supplementation.    Labs reviewed; Phosphorus (4.7) Medications reviewed; Lasix  Nutrition-focused physical exam could not be  completed at this time. Will complete at follow-up.  Diet Order:  Diet renal with fluid restriction Fluid restriction: 1200 mL Fluid; Room service appropriate? Yes; Fluid consistency: Thin  Skin:  Reviewed, no issues  Last BM:  3/21  Height:   Ht Readings from Last 1 Encounters:  07/30/16 5' (1.524 m)    Weight:   Wt Readings from Last 1 Encounters:  07/30/16 152 lb 12 oz (69.3 kg)    Ideal Body Weight:  45.4 kg  BMI:  There is no height or weight on file to calculate BMI.  Estimated Nutritional Needs:   Kcal:  1650-1850  Protein:  75-85 grams  Fluid:  < 1.2 L/d  EDUCATION NEEDS:   No education needs identified at this time  [employer] Dietetic Intern

## 2016-08-06 NOTE — Progress Notes (Signed)
Patient states that she is having very little bowel movements in the past few days. Patient is requesting miralax and senokot. Doyle Askew, MD notified. Orders placed and followed. Will continue to monitor.

## 2016-08-06 NOTE — Consult Note (Signed)
   [facility] Inpatient Consult   08/06/2016  Valerie Terrell 1933/04/14 [id number]  Patient was assess for re-admission of the [facility]. Chart review reveals from H&P that the patient Valerie Terrell is a 83 y.o. female with medical history significant of solitary kidney, CKD thoracic compression fracture, PAF hypertension hyperlipidemia, CVA, osteoporosis.  Patient presented with nausea and vomiting with abdominla cramping with chills with an episode of vertigo. Patient was discussed with inpatient Malachy Mood that she is eligible for [facility] Care Management services.  Will follow for disposition and community needs for care management.  For questions, please contact:  Natividad Brood, RN BSN [facility] Liaison  [phone] business mobile phone Toll free office [phone]

## 2016-08-06 NOTE — Evaluation (Signed)
Occupational Therapy Evaluation Patient Details Name: Valerie Terrell MRN: [id number] DOB: [birth year] Today's Date: 08/06/2016    History of Present Illness 83 y.o. female with medical history significant of solitary kidney, CKD, thoracic compression fracture (T8 kyphoplasty on 3/9), PAF, hypertension, hyperlipidemia, CVA, osteoporosis. Admitted to ED for nausea and vomiting per recommendation of pt's MD.      Clinical Impression   PTA, pt lived with her daughter and was independent in 60 of ADLs with A for socks and IADLs. Currently, Pt performs ADLs and functional mobility with Min guard and Mod A for LB ADLs due to soreness, limited ROM, and edema in BLE. Provided pt with fall prevention handout and education; pt verbalized understanding. Pt would benefit from acute OT to increase independence and safety in ADLs and functional mobility. Recommend dc home once medically stable per physician.     Follow Up Recommendations  No OT follow up;Supervision/Assistance - 24 hour    Equipment Recommendations  None recommended by OT    Recommendations for Other Services       Precautions / Restrictions Precautions Precautions: Fall Restrictions Weight Bearing Restrictions: No      Mobility Bed Mobility Overal bed mobility: Needs Assistance Bed Mobility: Supine to Sit     Supine to sit: Min guard;HOB elevated        Transfers Overall transfer level: Needs assistance Equipment used: None Transfers: Sit to/from Stand Sit to Stand: Min guard              Balance Overall balance assessment: Needs assistance Sitting-balance support: Feet supported;No upper extremity supported Sitting balance-Leahy Scale: Good     Standing balance support: No upper extremity supported;During functional activity Standing balance-Leahy Scale: Fair                              ADL Overall ADL's : Needs assistance/impaired Eating/Feeding: Set up;Sitting   Grooming:  Wash/dry hands;Min guard;Standing   Upper Body Bathing: Set up;Sitting   Lower Body Bathing: Minimal assistance;Sit to/from stand   Upper Body Dressing : Set up;Sitting   Lower Body Dressing: Sit to/from stand;Moderate assistance Lower Body Dressing Details (indicate cue type and reason): Pt requires A for donning socks. pt reports she is able to don pants Toilet Transfer: Min guard;Ambulation Toilet Transfer Details (indicate cue type and reason): Simulated to recliner         Functional mobility during ADLs: Min guard       Vision Baseline Vision/History: Wears glasses Wears Glasses: At all times       Perception     Praxis      Pertinent Vitals/Pain Pain Assessment: 0-10 Pain Score: 6  Pain Location: Back Pain Descriptors / Indicators: Sore Pain Intervention(s): Monitored during session     Hand Dominance Right   Extremity/Trunk Assessment Upper Extremity Assessment Upper Extremity Assessment: Overall WFL for tasks assessed   Lower Extremity Assessment Lower Extremity Assessment: RLE deficits/detail;LLE deficits/detail RLE Deficits / Details: Edema, tenderness, and generalized weakness RLE Sensation:  (intact) LLE Deficits / Details: Edema, tenderness, and generalized weakness LLE Sensation:  (intact)   Cervical / Trunk Assessment Cervical / Trunk Assessment: Normal   Communication Communication Communication: No difficulties   Cognition Arousal/Alertness: Awake/alert Behavior During Therapy: WFL for tasks assessed/performed Overall Cognitive Status: Within Functional Limits for tasks assessed                     General Comments  SpO2 stayed ~92 on room air throughout session.     Exercises       Shoulder Instructions      Home Living Family/patient expects to be discharged to:: Private residence Living Arrangements: Children Available Help at Discharge: Family;Available 24 hours/day Type of Home: House Home Access: Stairs to  enter [employer] of Steps: 3-4 Entrance Stairs-Rails: None Home Layout: One level     Bathroom Shower/Tub: Teacher, early years/pre: Handicapped height     Home Equipment: Environmental consultant - 2 wheels;Cane - quad;Bedside commode   Additional Comments: Pt sponges baths with BSC at sink at home. Pt states "I don't want to tackle the tub right now."      Prior Functioning/Environment Level of Independence: Needs assistance  Gait / Transfers Assistance Needed: Sometimes uses cane or walker when feeling unsteady ADL's / Homemaking Assistance Needed: Daughter helps with socks and IADLs   Comments: Pt reports that she grocery shops, drives, and mows lawn        OT Problem List: Decreased activity tolerance;Impaired balance (sitting and/or standing);Decreased safety awareness;Decreased knowledge of use of DME or AE;Pain      OT Treatment/Interventions: Self-care/ADL training;Energy conservation;Therapeutic activities;Patient/family education;DME and/or AE instruction;Therapeutic exercise    OT Goals(Current goals can be found in the care plan section) Acute Rehab OT Goals Patient Stated Goal: Feel better OT Goal Formulation: With patient Time For Goal Achievement: 08/20/16 Potential to Achieve Goals: Good ADL Goals Pt Will Perform Lower Body Bathing: with set-up;with supervision;sit to/from stand;with adaptive equipment Pt Will Perform Lower Body Dressing: with set-up;with supervision;with adaptive equipment;sit to/from stand Pt Will Transfer to Toilet: with supervision;bedside commode;ambulating Additional ADL Goal #1: Pt will independently verbalize three fall prevention strategies  OT Frequency: Min 2X/week   Barriers to D/C:            Co-evaluation              End of Session Equipment Utilized During Treatment: Gait belt Nurse Communication: Mobility status  Activity Tolerance: Patient tolerated treatment well Patient left: in chair;with call  bell/phone within reach  OT Visit Diagnosis: Unsteadiness on feet (R26.81);History of falling (Z91.81);Pain Pain - Right/Left:  (Lower back) Pain - part of body:  (Back)                ADL either performed or assessed with clinical judgement  Time: 0820-0849 OT Time Calculation (min): 29 min Charges:  OT General Charges $OT Visit: 1 Procedure OT Evaluation $OT Eval Low Complexity: 1 Procedure OT Treatments $Self Care/Home Management : 8-22 mins G-Codes:     [employer], OTR/L [place] 08/06/2016, 9:28 AM

## 2016-08-06 NOTE — Progress Notes (Addendum)
[name], female   DOB: 01-13-1933, 83 y.o.   MRN: [id number]    PROGRESS NOTE   Valerie Terrell  WCH:[id number] DOB: [birth year] DOA: 08/05/2016  PCP: Tammi Sou, MD   Brief Narrative:  83 y.o. female with CKD 3 seconary to HTN and solitary fx Rt kidney (Lt atrophic), prior thoracic fractures requiring kyphoplasty, admitted for back pain, N/V, one month of progressively worsening edema over the last 2 months.  Assessment & Plan:  Acute on CKD 3 - management per nephrology team - resumed lasix 80 mg QD and monitor response  - stopped Chlorthalidone  - daily I/O - BMP in AM  Recent thoracic comp fx - will ask IR to review images to see if amenable to vertebroplasty - PT saw pt and recommended HH PT  Hx A fib, now NSR -  continue Apixaban - rate controlled on Atenolol  HTN, Essential - continue lasix, hydralazine, atenolol   Anemia of chronic disease - no signs of active bleeding  DVT prophylaxis: Apixaban Code Status: Full Family Communication: Patient at bedside  Disposition Plan: home on 1-2 days   Consultants:   Nephrology  Procedures:   None  Antimicrobials:   None   Subjective: No events overnight   Objective: Vitals:   08/05/16 2200 08/06/16 0837 08/06/16 0935 08/06/16 1422  BP: (!) 159/62 (!) 157/45 (!) 153/50 (!) 155/57  Pulse: 73 67 74   Resp: 18  18   Temp: 97.5 F (36.4 C)  98.3 F (36.8 C)   TempSrc: Oral  Oral   SpO2: 92%  92%     Intake/Output Summary (Last 24 hours) at 08/06/16 1615 Last data filed at 08/06/16 1223  Gross per 24 hour  Intake             1246 ml  Output              750 ml  Net              496 ml   There were no vitals filed for this visit.  Examination:  General exam: Appears calm and comfortable  Respiratory system: Clear to auscultation. Respiratory effort normal. Cardiovascular system: S1 & S2 heard, RRR. No JVD, rubs, gallops or clicks. Gastrointestinal system: Abdomen is  nondistended, soft and nontender. No organomegaly or masses felt.   Data Reviewed: I have personally reviewed following labs and imaging studies  CBC:  Recent Labs Lab 08/05/16 1600 08/06/16 0813  WBC 6.9 4.9  NEUTROABS 5.4  --   HGB 11.1* 10.0*  HCT 35.5* 31.3*  MCV 94.7 95.1  PLT 354 353   Basic Metabolic Panel:  Recent Labs Lab 08/05/16 1600 08/06/16 0813  NA 135 137  K 4.3 3.8  CL 96* 98*  CO2 27 29  GLUCOSE 115* 99  BUN 70* 63*  CREATININE 2.98* 2.58*  CALCIUM 8.8* 8.4*  MG  --  1.7  PHOS  --  4.7*   Liver Function Tests:  Recent Labs Lab 08/05/16 1600 08/06/16 0813  AST 28 24  ALT 14 14  ALKPHOS 64 60  BILITOT 0.3 0.6  PROT 6.0* 5.1*  ALBUMIN 3.1* 2.5*    Recent Labs Lab 08/05/16 1600 08/06/16 0813  LIPASE 60* 42   Cardiac Enzymes:  Recent Labs Lab 08/05/16 2125 08/06/16 0235 08/06/16 0813 08/06/16 1025  TROPONINI <0.03 <0.03 <0.03 <0.03   Thyroid Function Tests:  Recent Labs  08/06/16 0235  TSH 2.078   Anemia Panel:  Recent  Labs  08/06/16 1422  FERRITIN 78  TIBC 228*  IRON 47   Urine analysis:    Component Value Date/Time   COLORURINE YELLOW 08/06/2016 0158   APPEARANCEUR CLEAR 08/06/2016 0158   LABSPEC 1.010 08/06/2016 0158   PHURINE 6.0 08/06/2016 0158   GLUCOSEU NEGATIVE 08/06/2016 0158   HGBUR NEGATIVE 08/06/2016 0158   BILIRUBINUR NEGATIVE 08/06/2016 0158   KETONESUR NEGATIVE 08/06/2016 0158   PROTEINUR NEGATIVE 08/06/2016 0158   NITRITE NEGATIVE 08/06/2016 0158   LEUKOCYTESUR TRACE (A) 08/06/2016 0158   Radiology Studies: Dg Thoracic Spine 2 View  Result Date: 08/05/2016 CLINICAL DATA:  Mid thoracic pain for 2 weeks, status post kyphoplasty EXAM: THORACIC SPINE 2 VIEWS COMPARISON:  07/22/2016 and 07/24/2016 FINDINGS: Three views of thoracic spine submitted. No acute fracture or subluxation. Stable mild compression deformity T7 vertebral body. Again noted prior vertebroplasty and compression deformity T8  vertebral body. IMPRESSION: No acute fracture or subluxation. Stable mild compression deformity T7 vertebral body. Again noted prior vertebroplasty and compression deformity T8 vertebral body. Electronically Signed   By: Lahoma Crocker M.D.   On: 08/05/2016 17:48   Ct Head Wo Contrast  Result Date: 08/05/2016 CLINICAL DATA:  Vertigo EXAM: CT HEAD WITHOUT CONTRAST TECHNIQUE: Contiguous axial images were obtained from the base of the skull through the vertex without intravenous contrast. COMPARISON:  MRI 02/02/2016, CT brain 01/29/2016 FINDINGS: Brain: No acute territorial infarction, intracranial hemorrhage or focal mass lesion is visualized. There is mild to moderate atrophy. Mild periventricular, subcortical and deep white matter small vessel ischemic changes. Stable ventricle size. Vascular: No hyperdense vessels. Carotid artery calcifications. Vertebral artery calcifications. Skull: No suspicious bone lesion.  No fracture. Sinuses/Orbits: Minimal mucosal thickening in the ethmoid sinuses. No acute orbital abnormality. Other: None IMPRESSION: No definite CT evidence for acute intracranial abnormality. Small vessel ischemic changes of the white matter and atrophy. Electronically Signed   By: Donavan Foil M.D.   On: 08/05/2016 22:12   Dg Abdomen Acute W/chest  Result Date: 08/05/2016 CLINICAL DATA:  Mid thoracic pain following T8 kyphoplasty 2 weeks ago. Nausea and vomiting for 1 day. EXAM: DG ABDOMEN ACUTE W/ 1V CHEST COMPARISON:  One-view chest x-ray 07/24/2016 FINDINGS: The heart is enlarged. Aortic atherosclerosis is again noted. A left pleural effusion is present. Retrocardiac basilar airspace opacification is noted as well. Mild pulmonary vascular congestion is present without frank edema. The right lung is otherwise clear. Remote right-sided rib fractures are again seen. T8 vertebral augmentation is noted. Bowel gas pattern is unremarkable. There is no obstruction or free air. Gas and stool are present  throughout a normal-sized colon. The stents atherosclerotic calcifications are present at distal aorta and branch vessels. The axial skeleton is unremarkable. IMPRESSION: 1. Cardiomegaly and moderate pulmonary vascular congestion without frank edema. 2. New left pleural effusion and associated airspace disease. While this likely reflects atelectasis, infection is not excluded. 3. Aortic atherosclerosis.  Branch vessel disease is noted as well. 4. Normal bowel gas pattern without evidence for obstruction or free air. Electronically Signed   By: San Morelle M.D.   On: 08/05/2016 17:49   Scheduled Meds: . apixaban  2.5 mg Oral BID  . atenolol  100 mg Oral Daily  . [START ON 08/07/2016] feeding supplement (NEPRO CARB STEADY)  237 mL Oral BID BM  . furosemide  80 mg Oral Daily  . hydrALAZINE  75 mg Oral Q8H  . polyethylene glycol  17 g Oral Daily  . pravastatin  40 mg Oral QPM  .  senna-docusate  1 tablet Oral BID  . sodium chloride flush  3 mL Intravenous Q12H  . sodium chloride flush  3 mL Intravenous Q12H   Continuous Infusions:   LOS: 1 day   Time spent: 20 minutes   Faye Ramsay, MD [facility] Pager [phone]  If 7PM-7AM, please contact night-coverage [email or web address] Password [facility] 08/06/2016, 4:15 PM

## 2016-08-07 ENCOUNTER — Encounter: Payer: Self-pay | Admitting: Family Medicine

## 2016-08-07 ENCOUNTER — Ambulatory Visit (HOSPITAL_COMMUNITY): Payer: Medicare Other

## 2016-08-07 DIAGNOSIS — N184 Chronic kidney disease, stage 4 (severe): Secondary | ICD-10-CM | POA: Diagnosis not present

## 2016-08-07 DIAGNOSIS — N182 Chronic kidney disease, stage 2 (mild): Secondary | ICD-10-CM | POA: Diagnosis not present

## 2016-08-07 DIAGNOSIS — R609 Edema, unspecified: Secondary | ICD-10-CM | POA: Diagnosis not present

## 2016-08-07 LAB — RENAL FUNCTION PANEL
ALBUMIN: 2.5 g/dL — AB (ref 3.5–5.0)
ANION GAP: 10 (ref 5–15)
BUN: 59 mg/dL — AB (ref 6–20)
CALCIUM: 8.4 mg/dL — AB (ref 8.9–10.3)
CO2: 30 mmol/L (ref 22–32)
Chloride: 96 mmol/L — ABNORMAL LOW (ref 101–111)
Creatinine, Ser: 2.51 mg/dL — ABNORMAL HIGH (ref 0.44–1.00)
GFR calc Af Amer: 20 mL/min — ABNORMAL LOW (ref >60)
GFR calc non Af Amer: 17 mL/min — ABNORMAL LOW (ref >60)
GLUCOSE: 93 mg/dL (ref 65–99)
Phosphorus: 4.4 mg/dL (ref 2.5–4.6)
Potassium: 3.1 mmol/L — ABNORMAL LOW (ref 3.5–5.1)
Sodium: 136 mmol/L (ref 135–145)

## 2016-08-07 LAB — CBC
HCT: 31.3 % — ABNORMAL LOW (ref 36.0–46.0)
HEMOGLOBIN: 9.7 g/dL — AB (ref 12.0–15.0)
MCH: 29.6 pg (ref 26.0–34.0)
MCHC: 31 g/dL (ref 30.0–36.0)
MCV: 95.4 fL (ref 78.0–100.0)
PLATELETS: 304 10*3/uL (ref 150–400)
RBC: 3.28 MIL/uL — ABNORMAL LOW (ref 3.87–5.11)
RDW: 15.5 % (ref 11.5–15.5)
WBC: 4.6 10*3/uL (ref 4.0–10.5)

## 2016-08-07 MED ORDER — AMLODIPINE BESYLATE 5 MG PO TABS
5.0000 mg | ORAL_TABLET | Freq: Two times a day (BID) | ORAL | Status: DC
  Administered 2016-08-08: 5 mg via ORAL
  Filled 2016-08-07: qty 1

## 2016-08-07 MED ORDER — ATENOLOL 100 MG PO TABS
100.0000 mg | ORAL_TABLET | Freq: Every day | ORAL | 0 refills | Status: DC
Start: 2016-08-08 — End: 2016-08-08

## 2016-08-07 MED ORDER — FUROSEMIDE 80 MG PO TABS: 80.0000 mg | ORAL_TABLET | Freq: Every day | ORAL | Status: DC

## 2016-08-07 MED ORDER — POTASSIUM CHLORIDE CRYS ER 20 MEQ PO TBCR: 40.0000 meq | EXTENDED_RELEASE_TABLET | Freq: Once | ORAL | Status: DC

## 2016-08-07 MED ORDER — SODIUM CHLORIDE 0.9 % IV SOLN
510.0000 mg | Freq: Once | INTRAVENOUS | Status: AC
  Administered 2016-08-07: 510 mg via INTRAVENOUS
  Filled 2016-08-07 (×2): qty 17

## 2016-08-07 MED ORDER — HYDRALAZINE HCL 20 MG/ML IJ SOLN: 5.0000 mg | INTRAMUSCULAR | Status: DC | PRN

## 2016-08-07 MED ORDER — POTASSIUM CHLORIDE CRYS ER 20 MEQ PO TBCR
40.0000 meq | EXTENDED_RELEASE_TABLET | Freq: Once | ORAL | Status: AC
  Administered 2016-08-07: 40 meq via ORAL
  Filled 2016-08-07: qty 2

## 2016-08-07 NOTE — Progress Notes (Signed)
Nutrition Follow-up  DOCUMENTATION CODES:   Not applicable  INTERVENTION:  - Continue Nepro BID, each supplement provides 425 calories and 19.1 grams protein  - Provided pt, per request, Renal Diet Education  NUTRITION DIAGNOSIS:   Increased nutrient needs related to acute illness as evidenced by estimated needs.  Ongoing  GOAL:   Patient will meet greater than or equal to 90% of their needs  Progressing  MONITOR:   PO intake, Supplement acceptance, Labs, Weight trends, I & O's  REASON FOR ASSESSMENT:   Consult Assessment of nutrition requirement/status  ASSESSMENT:   83 y.o. female with PMH of solitary kidney, CKD thoracic compression fracture, PAF hypertension hyperlipidemia, CVA osteoporosis presented with N/V, abdominal cramping, and occasional chills. Patient was admitted 07/11/16 for acute on chronic renal failure and was readmitted 07/22/16 for continued back pain  Pt reports recent weight loss of 20 lbs in 6 weeks. Stating a UBW of 138 lbs and states she weighed this 1 month ago. Per chart weight loss is not this significant. Furthermore this weight fluctuation could be related to fluid fluctuations.  Per pt she eats cereal, an egg, and a glass of OJ at breakfast, very little for lunch or dinner but did not expand further. Pt reports this has been going on for 1 month period. Meal tray at bedside had only a few bites consumed   Pt requested Renal diet education. Provided pt the renal pyramid handout. Discussed avoiding/alternatives to sodium, phosphorus, and potassium foods in the diet.   Labs reviewed; K (3.1) Medications reviewed; Lasix, Miralax, Senokot-S  Nutrition-focused physical exam completed. Findings include mild-moderate fat depletion in the orbital region only, mild-moderate muscle depletion in the temple region only, and moderate edema. Muscle and fat depletions may be masked due to pt's edema.   Unable to diagnose malnutrition at this time depletions  may be associated with age related sarcopenia.   Diet Order:  Diet 2 gram sodium Room service appropriate? Yes; Fluid consistency: Thin  Skin:  Reviewed, no issues  Last BM:  3/21  Height:   Ht Readings from Last 1 Encounters:  07/30/16 5' (1.524 m)    Weight:   Wt Readings from Last 1 Encounters:  08/07/16 142 lb (64.4 kg)    Ideal Body Weight:  45.4 kg  BMI:  Body mass index is 27.73 kg/m.  Estimated Nutritional Needs:   Kcal:  1650-1850  Protein:  75-85 grams  Fluid:  < 1.2 L/d  EDUCATION NEEDS:   No education needs identified at this time  [employer] Dietetic Intern

## 2016-08-07 NOTE — Care Management Note (Signed)
Case Management Note  Patient Details  Name: Enjoli Tidd MRN: [id number] Date of Birth: 05-01-1933  Subjective/Objective:                 Spoke with patient and the daughter at bedside. She would like PT, denies needs for additional [facility] services. States she has RW cane, BSC, raised toilet seat at home, denies needs for additional DME. Patient continues today with nausea, pain and poor PO. Referral made to [facility] and accepted.   Action/Plan:  Anticipate DC to home with [facility] when medically clear.   Expected Discharge Date:                  Expected Discharge Plan:  [place]  In-House Referral:     Discharge planning Services  CM Consult  Post Acute Care Choice:    Choice offered to:  Patient  DME Arranged:    DME Agency:     HH Arranged:  PT [place]:  [facility]  Status of Service:  In process, will continue to follow  If discussed at Long Length of Stay Meetings, dates discussed:    Additional Comments:  Carles Collet, RN 08/07/2016, 2:37 PM

## 2016-08-07 NOTE — Discharge Summary (Signed)
Physician Discharge Summary  Valerie Terrell OEU:[id number] DOB: [birth year] DOA: 08/05/2016  PCP: Valerie Sou, MD  Admit date: 08/05/2016 Discharge date: 08/08/2016  Recommendations for Outpatient Follow-up:  1. Pt will need to follow up with PCP in 1-2 weeks post discharge 2. Please obtain BMP to evaluate electrolytes and kidney function 3. Please also check CBC to evaluate Hg and Hct levels 4. Please note that Atenolol - chlorthalidone was stopped and pt provided script for Atenolol only per nephrology team recommendations  5. Please also note that lasix changed form 80 mg BID to 80 mg QD per nephrology team recommendations   Discharge Diagnoses:  Active Problems:   PAF (paroxysmal atrial fibrillation) (HCC)   Essential hypertension   CKD (chronic kidney disease), stage II   Acute on chronic renal insufficiency   Thoracic compression fracture (HCC)   Nausea & vomiting   Dehydration   AKI (acute kidney injury) ([facility])  Discharge Condition: Stable  Diet recommendation: Heart healthy diet discussed in details   Brief Narrative:  83 y.o.femalewith CKD 3 seconary to HTN and solitary fx Rt kidney (Lt atrophic), prior thoracic fractures requiring kyphoplasty, admitted for back pain, N/V, one month of progressively worsening edema over the last 2 months.  Assessment & Plan:  Acute on CKD 3 - management per nephrology team - resumed lasix 80 mg QD and Cr is trending down  - stopped Chlorthalidone  - daily I/O - BMP in AM  Recent thoracic comp fx - d/w IR, fractures not amenable to vertebroplasty - PT saw pt and recommended HH PT  - pt agreeable to plan   Hypokalemia - supplement and repeat BMP in AM  Hx A fib, now NSR -  continue Apixaban - rate controlled on Atenolol  HTN, Essential - continue lasix, hydralazine, atenolol   Anemia of chronic disease - no signs of active bleeding  DVT prophylaxis: Apixaban Code Status: Full Family Communication: Patient at  bedside  Disposition Plan: home in AM if K stable and Cr improving   Consultants:   Nephrology  Procedures:   None  Antimicrobials:   None   Procedures/Studies: Ct Abdomen Pelvis Wo Contrast  Result Date: 07/11/2016 CLINICAL DATA:  Back pain, right CVA pain, history of stones. EXAM: CT ABDOMEN AND PELVIS WITHOUT CONTRAST TECHNIQUE: Multidetector CT imaging of the abdomen and pelvis was performed following the standard protocol without IV contrast. COMPARISON:  None. FINDINGS: Lower chest: Small left pleural effusion and compressive atelectasis in the left lower lobe. Heart is enlarged. Coronary artery calcification. Small amount of pericardial fluid may be physiologic. Distal esophagus is grossly unremarkable. Hepatobiliary: Faint 11 mm low-attenuation lesion in the dome of the liver is too small to characterize. Liver is otherwise unremarkable. There may be sludge in the gallbladder. No biliary ductal dilatation. Pancreas: Negative. Spleen: Negative. Adrenals/Urinary Tract: Adrenal glands are unremarkable. 1.5 cm low-attenuation lesion in the right kidney is difficult to further characterize without IV contrast. There may be a punctate stone in a markedly atrophic left kidney. Ureters are decompressed. Bladder is low in volume. Stomach/Bowel: Stomach, small bowel and colon are unremarkable. Appendix is not readily visualized. Vascular/Lymphatic: Atherosclerotic calcification of the arterial vasculature without abdominal aortic aneurysm. Porta hepatis, retroperitoneal and mesenteric lymph nodes measure up to 10 mm. Reproductive: No adnexal mass. Other: Small periumbilical and bilateral inguinal hernias contain fat. Musculoskeletal: No worrisome lytic or sclerotic lesions. IMPRESSION: 1. No findings to explain the patient's symptoms. 2. Tiny left pleural effusion with compressive atelectasis left lower lobe.  3. Borderline porta hepatis, retroperitoneal and mesenteric lymph nodes, nonspecific. 4.   Aortic atherosclerosis (ICD10-170.0). 5. Possible gallbladder sludge. 6. Possible punctate stone in an atrophic left kidney. Electronically Signed   By: Lorin Picket M.D.   On: 07/11/2016 12:02   Dg Chest 2 View  Result Date: 07/11/2016 CLINICAL DATA:  83 year old female with a 2 day history of pleuritic back pain, weakness and shortness of breath EXAM: CHEST  2 VIEW COMPARISON:  Prior chest x-ray 02/27/2016 FINDINGS: Stable cardiomegaly. Atherosclerotic calcifications are present in the transverse aorta. Mild pulmonary vascular congestion without overt edema. Interval development of a small layering left pleural effusion and associated left basilar opacity which is favored to reflect atelectasis. Atherosclerotic calcifications are present in the transverse aorta. No acute osseous abnormality. IMPRESSION: 1. New development of a small left layering pleural effusion with associated left basilar opacity likely reflecting atelectasis. 2. Stable cardiomegaly. 3. Mild vascular congestion without overt edema. 4.  Aortic Atherosclerosis (ICD10-170.0). Electronically Signed   By: Jacqulynn Cadet M.D.   On: 07/11/2016 09:33   Dg Thoracic Spine 2 View  Result Date: 08/05/2016 CLINICAL DATA:  Mid thoracic pain for 2 weeks, status post kyphoplasty EXAM: THORACIC SPINE 2 VIEWS COMPARISON:  07/22/2016 and 07/24/2016 FINDINGS: Three views of thoracic spine submitted. No acute fracture or subluxation. Stable mild compression deformity T7 vertebral body. Again noted prior vertebroplasty and compression deformity T8 vertebral body. IMPRESSION: No acute fracture or subluxation. Stable mild compression deformity T7 vertebral body. Again noted prior vertebroplasty and compression deformity T8 vertebral body. Electronically Signed   By: Lahoma Crocker M.D.   On: 08/05/2016 17:48   Dg Thoracic Spine W/swimmers  Result Date: 07/22/2016 CLINICAL DATA:  Mid thoracic back pain for to 3 weeks. Two-view chest x-ray 07/11/2016. EXAM:  THORACIC SPINE - 3 VIEWS COMPARISON:  Two-view chest x-ray 07/11/2016. Two-view chest x-ray a 02/27/2016 FINDINGS: The compression fracture at T8 is new since October and appears to have progressed. The T7 compression fracture is suspected as well. Heart is enlarged. A left pleural effusion is again seen. IMPRESSION: 1. Progressive T7 and T8 compression fractures. 2. Cardiomegaly and left pleural effusion. Electronically Signed   By: San Morelle M.D.   On: 07/22/2016 21:02   Dg Lumbar Spine Complete  Result Date: 07/15/2016 CLINICAL DATA:  Right lower back pain for 6 days. EXAM: LUMBAR SPINE - COMPLETE 4+ VIEW COMPARISON:  CT abdomen and pelvis 07/11/2016 FINDINGS: There are 5 non rib bearing lumbar type vertebrae. A rudimentary disc is present at S1-2. Vertebral alignment is normal. Vertebral body heights are preserved without evidence of fracture. Moderate disc space narrowing is present at L5-S1 with degenerative endplate spurring and evidence of vacuum disc phenomenon. Mild endplate spurring is present more proximally in the lumbar spine without significant disc space height loss. No destructive osseous lesion is identified. Extensive abdominal aortic calcified atherosclerosis is noted. IMPRESSION: 1. Lumbar disc degeneration greatest at L5-S1. No evidence acute osseous abnormality. 2. Aortic atherosclerosis. Electronically Signed   By: Logan Bores M.D.   On: 07/15/2016 16:00   Ct Head Wo Contrast  Result Date: 08/05/2016 CLINICAL DATA:  Vertigo EXAM: CT HEAD WITHOUT CONTRAST TECHNIQUE: Contiguous axial images were obtained from the base of the skull through the vertex without intravenous contrast. COMPARISON:  MRI 02/02/2016, CT brain 01/29/2016 FINDINGS: Brain: No acute territorial infarction, intracranial hemorrhage or focal mass lesion is visualized. There is mild to moderate atrophy. Mild periventricular, subcortical and deep white matter small vessel ischemic  changes. Stable ventricle  size. Vascular: No hyperdense vessels. Carotid artery calcifications. Vertebral artery calcifications. Skull: No suspicious bone lesion.  No fracture. Sinuses/Orbits: Minimal mucosal thickening in the ethmoid sinuses. No acute orbital abnormality. Other: None IMPRESSION: No definite CT evidence for acute intracranial abnormality. Small vessel ischemic changes of the white matter and atrophy. Electronically Signed   By: Donavan Foil M.D.   On: 08/05/2016 22:12   Mr Thoracic Spine Wo Contrast  Result Date: 07/23/2016 CLINICAL DATA:  83 y/o  F; several weeks of back pain. EXAM: MRI THORACIC SPINE WITHOUT CONTRAST TECHNIQUE: Multiplanar, multisequence MR imaging of the thoracic spine was performed. No intravenous contrast was administered. COMPARISON:  07/22/2016 thoracic spine radiograph FINDINGS: Alignment:  Physiologic. Vertebrae: 50% moderate compression deformity of the T8 vertebral body with edema indicating recent injury. No significant retropulsion. No evidence of discitis, additional acute fracture, or suspicious osseous lesion. Cord:  No abnormal cord signal. Paraspinal and other soft tissues: Left kidney atrophy. Disc levels: Central protrusion at the T7-8 level with anterior cord impingement and mild flattening. No significant canal stenosis. At the T7-8 9 level on the right there is a 7 mm T2 hyperintense structure which is centered just anterior to the right-sided T8-9 facet in the extradural space, probably a synovial cyst. The cyst likely impinges on the exiting T8 nerve root. Mild bilateral degenerative facet edema at T8-9 and on the right at T6-7. IMPRESSION: 1. 50% moderate compression deformity of the T8 vertebral body with edema indicating recent injury. No significant retropulsion. 2. No evidence of discitis, additional acute fracture, or suspicious osseous lesion. 3. Central protrusion at the T7-8 level with anterior cord impingement and mild flattening. 4. At the T7-8 9 level on the right  there is a 7 mm T2 hyperintense structure which is centered just anterior to the right-sided T8-9 facet in the extradural space, probably a synovial cyst. The cyst likely impinges on the exiting T8 nerve root. 5. Mild bilateral degenerative facet edema at T8-9 and on the right at T6-7. Electronically Signed   By: Kristine Garbe M.D.   On: 07/23/2016 18:51   Mr Lumbar Spine Wo Contrast  Result Date: 07/16/2016 CLINICAL DATA:  Low back pain over the past couple of weeks which has worsened in the past 2 days. No known injury. EXAM: MRI LUMBAR SPINE WITHOUT CONTRAST TECHNIQUE: Multiplanar, multisequence MR imaging of the lumbar spine was performed. No intravenous contrast was administered. COMPARISON:  Plain films lumbar spine 07/15/2016. CT abdomen and pelvis 07/11/2016. FINDINGS: Segmentation: Standard. Rudimentary disc material at S1-2 incidentally noted. Alignment:  Maintained. Vertebrae:  No fracture.  Marrow signal is somewhat heterogeneous. Conus medullaris: Extends to the L3-4 Level and appears normal. Paraspinal and other soft tissues: T2 hyperintense lesion right kidney noted. The left kidney is markedly atrophic. Disc levels: T11-12 and T12-L1 are imaged in the sagittal plane only and negative. L1-2:  Negative. L2-3:  Negative. L3-4: There is some ligamentum flavum thickening and mild facet degenerative change. Very small left foraminal protrusion is seen. The central canal and foramina are open. L4-5: Mild facet degenerative change and ligamentum flavum thickening. The central canal and foramina are open. L5-S1: Shallow central protrusion without central canal or foraminal stenosis. IMPRESSION: Negative for fracture or other acute abnormality. Mild degenerative disease as described above. Electronically Signed   By: Inge Rise M.D.   On: 07/16/2016 12:44   US Renal  Result Date: 07/11/2016 CLINICAL DATA:  Right flank pain history of chronic renal insufficiency  EXAM: RENAL / URINARY  TRACT ULTRASOUND COMPLETE COMPARISON:  CT 07/11/2016, renal ultrasound 01/29/2016 FINDINGS: Right Kidney: Length: 11.2 cm. Echogenicity within normal limits. No hydronephrosis. 1.9 x 1.5 x 1.7 cm cyst mid right kidney. Left Kidney: Length: Not clearly visualized. Bladder: Poorly visualized, and is likely empty. Incidental note made of fatty liver. IMPRESSION: 1. 1.9 cm cyst in the right kidney.  No right hydronephrosis 2. Left kidney is not clearly visualized, this may be secondary to marked atrophy. 3. Bladder not well visualized and is likely empty. Electronically Signed   By: Donavan Foil M.D.   On: 07/11/2016 18:21   Dg Chest Port 1 View  Result Date: 07/24/2016 CLINICAL DATA:  Left-sided chest pain. EXAM: PORTABLE CHEST 1 VIEW COMPARISON:  07/12/2016 FINDINGS: Stable cardiac enlargement and mild chronic lung disease. Stable chronic scarring/atelectasis at the left lung base. There is no evidence of pulmonary edema, consolidation, pneumothorax, nodule or pleural fluid. IMPRESSION: Stable cardiac enlargement and chronic lung disease. No active disease. Electronically Signed   By: Aletta Edouard M.D.   On: 07/24/2016 09:26   Dg Chest Port 1 View  Result Date: 07/12/2016 CLINICAL DATA:  Fluid retention and legs. EXAM: PORTABLE CHEST 1 VIEW COMPARISON:  07/11/2016 FINDINGS: There is moderate cardiac enlargement. Aortic atherosclerosis noted. Small to moderate left pleural effusion is again identified. No airspace opacities. IMPRESSION: Similar appearance of cardiac enlargement, aortic atherosclerosis and left pleural effusion. Electronically Signed   By: Kerby Moors M.D.   On: 07/12/2016 18:43   Dg Abdomen Acute W/chest  Result Date: 08/05/2016 CLINICAL DATA:  Mid thoracic pain following T8 kyphoplasty 2 weeks ago. Nausea and vomiting for 1 day. EXAM: DG ABDOMEN ACUTE W/ 1V CHEST COMPARISON:  One-view chest x-ray 07/24/2016 FINDINGS: The heart is enlarged. Aortic atherosclerosis is again noted. A  left pleural effusion is present. Retrocardiac basilar airspace opacification is noted as well. Mild pulmonary vascular congestion is present without frank edema. The right lung is otherwise clear. Remote right-sided rib fractures are again seen. T8 vertebral augmentation is noted. Bowel gas pattern is unremarkable. There is no obstruction or free air. Gas and stool are present throughout a normal-sized colon. The stents atherosclerotic calcifications are present at distal aorta and branch vessels. The axial skeleton is unremarkable. IMPRESSION: 1. Cardiomegaly and moderate pulmonary vascular congestion without frank edema. 2. New left pleural effusion and associated airspace disease. While this likely reflects atelectasis, infection is not excluded. 3. Aortic atherosclerosis.  Branch vessel disease is noted as well. 4. Normal bowel gas pattern without evidence for obstruction or free air. Electronically Signed   By: San Morelle M.D.   On: 08/05/2016 17:49   Dg Abd Acute W/chest  Result Date: 07/22/2016 CLINICAL DATA:  Severe back pain, swelling of the legs EXAM: DG ABDOMEN ACUTE W/ 1V CHEST COMPARISON:  Chest x-ray of 07/12/2016 and CT abdomen pelvis of 07/11/2016 FINDINGS: Moderate cardiomegaly is stable. No active infiltrate or effusion is seen. Mild fluid overload cannot be excluded. Supine and erect views the abdomen show a moderate amount of feces throughout the colon. No bowel obstruction is seen. No free air is noted. The bones are osteopenic. IMPRESSION: 1. Cardiomegaly. No active lung disease. Question mild fluid overload. 2. Moderate amount of feces in the colon. No bowel obstruction or free air. Electronically Signed   By: Ivar Drape M.D.   On: 07/22/2016 16:32   Ir Kypho Thoracic With Bone Biopsy  Result Date: 07/27/2016 INDICATION: Severe lower thoracic pain secondary to  compression fracture at T8. EXAM: BALLOON KYPHOPLASTY AT T8 COMPARISON:  MRI of the thoracic spine of 07/23/2016.  MEDICATIONS: As antibiotic prophylaxis, Ancef 1 g IV was ordered pre-procedure and administered intravenously within 1 hour of incision. ANESTHESIA/SEDATION: Moderate (conscious) sedation was employed during this procedure. A total of Versed 2 mg and Fentanyl 100 mcg was administered intravenously. Moderate Sedation Time: 26 minutes. The patient's level of consciousness and vital signs were monitored continuously by radiology nursing throughout the procedure under my direct supervision. FLUOROSCOPY TIME:  Fluoroscopy Time: 9 minutes 12 seconds (400 mGy) COMPLICATIONS: None immediate. PROCEDURE: Following a full explanation of the procedure along with the potential associated complications, an informed witnessed consent was obtained. The patient was placed prone on the fluoroscopic table. The skin overlying the thoracic region was then prepped and draped in the usual sterile fashion. The right pedicle at T8 was then infiltrated with 0.25% bupivacaine followed by the advancement of an 11-gauge Jamshidi needle through the right pedicle into the posterior one-third at T8. This was then exchanged for a Kyphon advanced osteo introducer system comprised of a working cannula and a Kyphon osteo drill. This combination was then advanced over a Kyphon osteo bone pin until the tip of the Kyphon osteo drill was in the posterior third at T8. At this time, the bone pin was removed. In a medial trajectory, the combination was advanced until the tip of the working cannula was inside the posterior one-third at T8. The osteo drill was removed. Through the working cannula, a Kyphon inflatable bone tamp 20 x 3 was advanced and positioned with the distal marker 5 mm from the anterior aspect of T8. Crossing of the midline was seen on the AP projection. At this time, the balloon was expanded using contrast via a Kyphon inflation syringe device via microtubing. Inflations were continued until there was apposition with the superior and the  inferior endplates. At this time, methylmethacrylate mixture was reconstituted with Tobramycin in the Kyphon bone mixing device system. This was then loaded onto the Kyphon bone fillers. The balloon was deflated and removed followed by the instillation of 1 and a half bone filler equivalents of methylmethacrylate mixture at T8 with excellent filling in the AP and lateral projections. No extravasation was noted in the disk spaces or posteriorly into the spinal canal. No epidural venous contamination was seen. The working cannula and the bone filler were then retrieved and removed. Hemostasis was achieved at the skin entry site. The patient then returned to her room in stable condition. IMPRESSION: 1. Status post vertebral body augmentation using balloon kyphoplasty at T8 as described without event. Electronically Signed   By: Luanne Bras M.D.   On: 07/24/2016 15:27    Discharge Exam: Vitals:   08/07/16 0806 08/07/16 1331  BP: (!) 173/61 (!) 160/58  Pulse: 68 65  Resp: 18 18  Temp: 97.7 F (36.5 C)    Vitals:   08/06/16 2026 08/07/16 0525 08/07/16 0806 08/07/16 1331  BP: (!) 157/58 (!) 142/50 (!) 173/61 (!) 160/58  Pulse: 69 66 68 65  Resp: 18 16 18 18   Temp: 98.6 F (37 C) 98.2 F (36.8 C) 97.7 F (36.5 C)   TempSrc:   Oral   SpO2: 93% 94% 94% 95%  Weight:  64.4 kg (142 lb)      General: Pt is alert, follows commands appropriately, not in acute distress Cardiovascular: Regular rate and rhythm,  no rubs, no gallops Respiratory: Clear to auscultation bilaterally, no wheezing, no  crackles, no rhonchi Abdominal: Soft, non tender, non distended, bowel sounds +, no guarding Extremities: +1 bilateral LE pitting edema, no cyanosis, pulses palpable bilaterally DP and PT  Discharge Instructions   Allergies as of 08/07/2016      Reactions   Clonidine Derivatives Palpitations, Other (See Comments)   Very sedated   Codeine Nausea Only   Nickel Rash   Sulfa Antibiotics Other (See  Comments)   Reaction unknown      Medication List    STOP taking these medications   atenolol-chlorthalidone 100-25 MG tablet Commonly known as:  TENORETIC     TAKE these medications   acetaminophen 500 MG tablet Commonly known as:  TYLENOL Take 500 mg by mouth every 6 (six) hours as needed for moderate pain.   amLODipine 5 MG tablet Commonly known as:  NORVASC Take 1 tablet (5 mg total) by mouth 2 (two) times daily with breakfast and lunch. What changed:  when to take this   apixaban 2.5 MG Tabs tablet Commonly known as:  ELIQUIS Take 1 tablet (2.5 mg total) by mouth 2 (two) times daily.   atenolol 100 MG tablet Commonly known as:  TENORMIN Take 1 tablet (100 mg total) by mouth daily. Start taking on:  08/08/2016   CALCIUM/VITAMIN D PO Take 1 tablet by mouth 2 (two) times daily.   furosemide 80 MG tablet Commonly known as:  LASIX Take 1 tablet (80 mg total) by mouth daily. What changed:  when to take this   hydrALAZINE 25 MG tablet Commonly known as:  APRESOLINE Take 3 tablets (75 mg total) by mouth 3 (three) times daily.   Magnesium 27 500 (27 Mg) MG Tabs Take 500 mg by mouth 4 (four) times a week.   methocarbamol 750 MG tablet Commonly known as:  ROBAXIN Take 1 tablet (750 mg total) by mouth every 8 (eight) hours as needed for muscle spasms.   multivitamin tablet Take 1 tablet by mouth 3 (three) times a week.   ondansetron 4 MG tablet Commonly known as:  ZOFRAN Take 0.5-1 tablets (2-4 mg total) by mouth every 8 (eight) hours as needed for nausea or vomiting.   oxyCODONE-acetaminophen 5-325 MG tablet Commonly known as:  PERCOCET/ROXICET Take 1-2 tablets by mouth every 6 (six) hours as needed for severe pain.   pravastatin 40 MG tablet Commonly known as:  PRAVACHOL Take 40 mg by mouth every evening.   vitamin C 500 MG tablet Commonly known as:  ASCORBIC ACID Take 500 mg by mouth daily.      Follow-up Information    MCGOWEN,PHILIP H, MD Follow up.    Specialty:  Family Medicine Contact information: [address] [place] [address] [phone]            The results of significant diagnostics from this hospitalization (including imaging, microbiology, ancillary and laboratory) are listed below for reference.     Microbiology: No results found for this or any previous visit (from the past 240 hour(s)).   Labs: Basic Metabolic Panel:  Recent Labs Lab 08/05/16 1600 08/06/16 0813 08/07/16 0329  NA 135 137 136  K 4.3 3.8 3.1*  CL 96* 98* 96*  CO2 27 29 30   GLUCOSE 115* 99 93  BUN 70* 63* 59*  CREATININE 2.98* 2.58* 2.51*  CALCIUM 8.8* 8.4* 8.4*  MG  --  1.7  --   PHOS  --  4.7* 4.4   Liver Function Tests:  Recent Labs Lab 08/05/16 1600 08/06/16 0813  08/07/16 0329  AST 28 24  --   ALT 14 14  --   ALKPHOS 64 60  --   BILITOT 0.3 0.6  --   PROT 6.0* 5.1*  --   ALBUMIN 3.1* 2.5* 2.5*    Recent Labs Lab 08/05/16 1600 08/06/16 0813  LIPASE 60* 42   CBC:  Recent Labs Lab 08/05/16 1600 08/06/16 0813 08/07/16 0329  WBC 6.9 4.9 4.6  NEUTROABS 5.4  --   --   HGB 11.1* 10.0* 9.7*  HCT 35.5* 31.3* 31.3*  MCV 94.7 95.1 95.4  PLT 354 309 304   Cardiac Enzymes:  Recent Labs Lab 08/05/16 2125 08/06/16 0235 08/06/16 0813 08/06/16 1025  TROPONINI <0.03 <0.03 <0.03 <0.03   BNP: BNP (last 3 results)  Recent Labs  07/12/16 1707 07/13/16 0144 07/22/16 1749  BNP 857.9* 926.6* 887.7*   SIGNED: Time coordinating discharge: 30 minutes  Faye Ramsay, MD  [facility] 08/07/2016, 2:09 PM Pager [phone]  If 7PM-7AM, please contact night-coverage [email or web address] Password [email or web address]

## 2016-08-07 NOTE — Progress Notes (Signed)
IV occluded and removed. MD notified. MD stated that it was ok to leave out. Orders followed. Will continue to monitor.

## 2016-08-07 NOTE — Progress Notes (Signed)
Subjective:  600 of UOP recorded- kidney numbers pretty stable - pt says she peed a lot after given lasix and has noticed an improvement in her edema  Objective Vital signs in last 24 hours: Vitals:   08/06/16 1700 08/06/16 2026 08/07/16 0525 08/07/16 0806  BP: (!) 148/55 (!) 157/58 (!) 142/50 (!) 173/61  Pulse: 68 69 66 68  Resp: 18 18 16 18   Temp: 98 F (36.7 C) 98.6 F (37 C) 98.2 F (36.8 C) 97.7 F (36.5 C)  TempSrc: Oral   Oral  SpO2: 100% 93% 94% 94%  Weight:   64.4 kg (142 lb)    Weight change:   Intake/Output Summary (Last 24 hours) at 08/07/16 1240 Last data filed at 08/07/16 1030  Gross per 24 hour  Intake              963 ml  Output              400 ml  Net              563 ml    Assessment/ Plan: Pt is a 83 y.o. yo female who was admitted on 08/05/2016 with back pain/N/V but also found to have creatinine above her baseline and LE edema  Assessment/Plan: 1. A on CKD- crt was 3.1 as OP - in response to that lisinopril was held.  Creatinine has trended down some. Nothing additional to do at this point 2. Edema- onset over the last 2 months. Etiology possibly multiple hospitalizations with IV fluids but also worsening renal insufficiency. Has responded well to Lasix. To be continued.  Hold the blood pressure will improve with diuresis as well 3. Hypokalemia replete and takeoff of renal diet 4. Anemia - iron stores low-  will replete   Valerie Terrell A    Labs: Basic Metabolic Panel:  Recent Labs Lab 08/05/16 1600 08/06/16 0813 08/07/16 0329  NA 135 137 136  K 4.3 3.8 3.1*  CL 96* 98* 96*  CO2 27 29 30   GLUCOSE 115* 99 93  BUN 70* 63* 59*  CREATININE 2.98* 2.58* 2.51*  CALCIUM 8.8* 8.4* 8.4*  PHOS  --  4.7* 4.4   Liver Function Tests:  Recent Labs Lab 08/05/16 1600 08/06/16 0813 08/07/16 0329  AST 28 24  --   ALT 14 14  --   ALKPHOS 64 60  --   BILITOT 0.3 0.6  --   PROT 6.0* 5.1*  --   ALBUMIN 3.1* 2.5* 2.5*    Recent Labs Lab  08/05/16 1600 08/06/16 0813  LIPASE 60* 42   No results for input(s): AMMONIA in the last 168 hours. CBC:  Recent Labs Lab 08/05/16 1600 08/06/16 0813 08/07/16 0329  WBC 6.9 4.9 4.6  NEUTROABS 5.4  --   --   HGB 11.1* 10.0* 9.7*  HCT 35.5* 31.3* 31.3*  MCV 94.7 95.1 95.4  PLT 354 309 304   Cardiac Enzymes:  Recent Labs Lab 08/05/16 2125 08/06/16 0235 08/06/16 0813 08/06/16 1025  TROPONINI <0.03 <0.03 <0.03 <0.03   CBG: No results for input(s): GLUCAP in the last 168 hours.  Iron Studies:  Recent Labs  08/06/16 1422  IRON 47  TIBC 228*  FERRITIN 78   Studies/Results: Dg Thoracic Spine 2 View  Result Date: 08/05/2016 CLINICAL DATA:  Mid thoracic pain for 2 weeks, status post kyphoplasty EXAM: THORACIC SPINE 2 VIEWS COMPARISON:  07/22/2016 and 07/24/2016 FINDINGS: Three views of thoracic spine submitted. No acute fracture or subluxation. Stable mild  compression deformity T7 vertebral body. Again noted prior vertebroplasty and compression deformity T8 vertebral body. IMPRESSION: No acute fracture or subluxation. Stable mild compression deformity T7 vertebral body. Again noted prior vertebroplasty and compression deformity T8 vertebral body. Electronically Signed   By: Lahoma Crocker M.D.   On: 08/05/2016 17:48   Ct Head Wo Contrast  Result Date: 08/05/2016 CLINICAL DATA:  Vertigo EXAM: CT HEAD WITHOUT CONTRAST TECHNIQUE: Contiguous axial images were obtained from the base of the skull through the vertex without intravenous contrast. COMPARISON:  MRI 02/02/2016, CT brain 01/29/2016 FINDINGS: Brain: No acute territorial infarction, intracranial hemorrhage or focal mass lesion is visualized. There is mild to moderate atrophy. Mild periventricular, subcortical and deep white matter small vessel ischemic changes. Stable ventricle size. Vascular: No hyperdense vessels. Carotid artery calcifications. Vertebral artery calcifications. Skull: No suspicious bone lesion.  No fracture.  Sinuses/Orbits: Minimal mucosal thickening in the ethmoid sinuses. No acute orbital abnormality. Other: None IMPRESSION: No definite CT evidence for acute intracranial abnormality. Small vessel ischemic changes of the white matter and atrophy. Electronically Signed   By: Donavan Foil M.D.   On: 08/05/2016 22:12   Dg Abdomen Acute W/chest  Result Date: 08/05/2016 CLINICAL DATA:  Mid thoracic pain following T8 kyphoplasty 2 weeks ago. Nausea and vomiting for 1 day. EXAM: DG ABDOMEN ACUTE W/ 1V CHEST COMPARISON:  One-view chest x-ray 07/24/2016 FINDINGS: The heart is enlarged. Aortic atherosclerosis is again noted. A left pleural effusion is present. Retrocardiac basilar airspace opacification is noted as well. Mild pulmonary vascular congestion is present without frank edema. The right lung is otherwise clear. Remote right-sided rib fractures are again seen. T8 vertebral augmentation is noted. Bowel gas pattern is unremarkable. There is no obstruction or free air. Gas and stool are present throughout a normal-sized colon. The stents atherosclerotic calcifications are present at distal aorta and branch vessels. The axial skeleton is unremarkable. IMPRESSION: 1. Cardiomegaly and moderate pulmonary vascular congestion without frank edema. 2. New left pleural effusion and associated airspace disease. While this likely reflects atelectasis, infection is not excluded. 3. Aortic atherosclerosis.  Branch vessel disease is noted as well. 4. Normal bowel gas pattern without evidence for obstruction or free air. Electronically Signed   By: San Morelle M.D.   On: 08/05/2016 17:49   Medications: Infusions:   Scheduled Medications: . apixaban  2.5 mg Oral BID  . atenolol  100 mg Oral Daily  . feeding supplement (NEPRO CARB STEADY)  237 mL Oral BID BM  . furosemide  80 mg Oral Daily  . hydrALAZINE  75 mg Oral Q8H  . polyethylene glycol  17 g Oral Daily  . pravastatin  40 mg Oral QPM  . senna-docusate  1  tablet Oral BID  . sodium chloride flush  3 mL Intravenous Q12H  . sodium chloride flush  3 mL Intravenous Q12H    have reviewed scheduled and prn medications.  Physical Exam: General: Elderly, walking around in room in no acute distress Heart: Regular rate and rhythm Lungs: Mostly clear Abdomen: Soft nontender Extremities: 2-3+ pitting edema   08/07/2016,12:40 PM  LOS: 2 days

## 2016-08-07 NOTE — Plan of Care (Signed)
Problem: Education: Goal: Knowledge of [facility] Education information/materials will improve Outcome: Progressing POC reviewed with pt.   

## 2016-08-08 DIAGNOSIS — N183 Chronic kidney disease, stage 3 (moderate): Secondary | ICD-10-CM

## 2016-08-08 DIAGNOSIS — N170 Acute kidney failure with tubular necrosis: Secondary | ICD-10-CM

## 2016-08-08 DIAGNOSIS — S22000G Wedge compression fracture of unspecified thoracic vertebra, subsequent encounter for fracture with delayed healing: Secondary | ICD-10-CM

## 2016-08-08 LAB — RENAL FUNCTION PANEL
Albumin: 3 g/dL — ABNORMAL LOW (ref 3.5–5.0)
Anion gap: 10 (ref 5–15)
BUN: 56 mg/dL — ABNORMAL HIGH (ref 6–20)
CO2: 31 mmol/L (ref 22–32)
Calcium: 9 mg/dL (ref 8.9–10.3)
Chloride: 98 mmol/L — ABNORMAL LOW (ref 101–111)
Creatinine, Ser: 2.37 mg/dL — ABNORMAL HIGH (ref 0.44–1.00)
GFR calc Af Amer: 21 mL/min — ABNORMAL LOW
GFR calc non Af Amer: 18 mL/min — ABNORMAL LOW
Glucose, Bld: 105 mg/dL — ABNORMAL HIGH (ref 65–99)
Phosphorus: 3.4 mg/dL (ref 2.5–4.6)
Potassium: 3.5 mmol/L (ref 3.5–5.1)
Sodium: 139 mmol/L (ref 135–145)

## 2016-08-08 LAB — CBC
HCT: 34.5 % — ABNORMAL LOW (ref 36.0–46.0)
Hemoglobin: 10.7 g/dL — ABNORMAL LOW (ref 12.0–15.0)
MCH: 29.6 pg (ref 26.0–34.0)
MCHC: 31 g/dL (ref 30.0–36.0)
MCV: 95.6 fL (ref 78.0–100.0)
PLATELETS: 325 10*3/uL (ref 150–400)
RBC: 3.61 MIL/uL — ABNORMAL LOW (ref 3.87–5.11)
RDW: 15 % (ref 11.5–15.5)
WBC: 6.3 10*3/uL (ref 4.0–10.5)

## 2016-08-08 LAB — PARATHYROID HORMONE, INTACT (NO CA): PTH: 67 pg/mL — ABNORMAL HIGH (ref 15–65)

## 2016-08-08 MED ORDER — POTASSIUM CHLORIDE CRYS ER 20 MEQ PO TBCR
40.0000 meq | EXTENDED_RELEASE_TABLET | Freq: Once | ORAL | Status: AC
  Administered 2016-08-08: 40 meq via ORAL
  Filled 2016-08-08: qty 2

## 2016-08-08 MED ORDER — ATENOLOL 100 MG PO TABS: 100.0000 mg | ORAL_TABLET | Freq: Every day | ORAL | 0 refills | Status: DC

## 2016-08-08 NOTE — Progress Notes (Signed)
Subjective:  1600 of UOP recorded- kidney numbers pretty stable to improved - pt says  lasix has been effective- actually going home today- says legs are better but still seem edematous Objective Vital signs in last 24 hours: Vitals:   08/07/16 1620 08/07/16 2336 08/08/16 0100 08/08/16 0516  BP: (!) 161/61 (!) 145/112  (!) 178/60  Pulse: 70 77  73  Resp: 16 16  17   Temp: 99.3 F (37.4 C) 98.7 F (37.1 C)  97.7 F (36.5 C)  TempSrc: Oral Oral  Oral  SpO2: 95% 91%  90%  Weight:      Height:   5' (1.524 m)    Weight change:   Intake/Output Summary (Last 24 hours) at 08/08/16 1040 Last data filed at 08/08/16 0617  Gross per 24 hour  Intake             1080 ml  Output             1600 ml  Net             -520 ml    Assessment/ Plan: Pt is a 83 y.o. yo female who was admitted on 08/05/2016 with back pain/N/V but also found to have creatinine above her baseline and LE edema  Assessment/Plan: 1. A on CKD- crt was 3.1 as OP - in response to that lisinopril was held.  Creatinine has trended down some. Nothing additional to do at this point 2. Edema- onset over the last 2 months. Etiology possibly multiple hospitalizations with IV fluids but also worsening renal insufficiency. Has responded well to Lasix. To be continued.  Hope the blood pressure will improve with diuresis as well.  For BP now on lasix 80 daily, hydralazine 75 TID and norvasc 5 BID and atenolol 3. Hypokalemia replete and take off of renal diet 4. Anemia - iron stores low-  Gave iv iron   Alistair Senft A    Labs: Basic Metabolic Panel:  Recent Labs Lab 08/06/16 0813 08/07/16 0329 08/08/16 0631  NA 137 136 139  K 3.8 3.1* 3.5  CL 98* 96* 98*  CO2 29 30 31   GLUCOSE 99 93 105*  BUN 63* 59* 56*  CREATININE 2.58* 2.51* 2.37*  CALCIUM 8.4* 8.4* 9.0  PHOS 4.7* 4.4 3.4   Liver Function Tests:  Recent Labs Lab 08/05/16 1600 08/06/16 0813 08/07/16 0329 08/08/16 0631  AST 28 24  --   --   ALT 14 14  --    --   ALKPHOS 64 60  --   --   BILITOT 0.3 0.6  --   --   PROT 6.0* 5.1*  --   --   ALBUMIN 3.1* 2.5* 2.5* 3.0*    Recent Labs Lab 08/05/16 1600 08/06/16 0813  LIPASE 60* 42   No results for input(s): AMMONIA in the last 168 hours. CBC:  Recent Labs Lab 08/05/16 1600 08/06/16 0813 08/07/16 0329 08/08/16 0631  WBC 6.9 4.9 4.6 6.3  NEUTROABS 5.4  --   --   --   HGB 11.1* 10.0* 9.7* 10.7*  HCT 35.5* 31.3* 31.3* 34.5*  MCV 94.7 95.1 95.4 95.6  PLT 354 309 304 325   Cardiac Enzymes:  Recent Labs Lab 08/05/16 2125 08/06/16 0235 08/06/16 0813 08/06/16 1025  TROPONINI <0.03 <0.03 <0.03 <0.03   CBG: No results for input(s): GLUCAP in the last 168 hours.  Iron Studies:   Recent Labs  08/06/16 1422  IRON 47  TIBC 228*  FERRITIN 78  Studies/Results: No results found. Medications: Infusions:   Scheduled Medications: . amLODipine  5 mg Oral BID WC  . apixaban  2.5 mg Oral BID  . atenolol  100 mg Oral Daily  . feeding supplement (NEPRO CARB STEADY)  237 mL Oral BID BM  . furosemide  80 mg Oral Daily  . hydrALAZINE  75 mg Oral Q8H  . polyethylene glycol  17 g Oral Daily  . pravastatin  40 mg Oral QPM  . senna-docusate  1 tablet Oral BID  . sodium chloride flush  3 mL Intravenous Q12H  . sodium chloride flush  3 mL Intravenous Q12H    have reviewed scheduled and prn medications.  Physical Exam: General: Elderly, walking around in room in no acute distress- dressed and ready to go Heart: Regular rate and rhythm Lungs: Mostly clear Abdomen: Soft nontender Extremities: 2-3+ pitting edema   08/08/2016,10:40 AM  LOS: 3 days

## 2016-08-08 NOTE — Progress Notes (Signed)
PROGRESS NOTE                                                                                                                                                                                                             Patient Demographics:    Valerie Terrell, is a 83 y.o. female, DOB - 31-Oct-1932, ZTI:[id number]  Admit date - 08/05/2016   Admitting Physician Toy Baker, MD  Outpatient Primary MD for the patient is Tammi Sou, MD  LOS - 3  Outpatient Specialists: Dr Florene Glen ( renal)  Chief Complaint  Patient presents with  . Emesis  . Constipation       Brief Narrative  83 y.o.femalewith CKD 3 seconary to HTN and solitary fx Rt kidney (Lt atrophic), prior thoracic fractures requiring kyphoplasty, admitted for back pain, N/V, one month of progressively worsening edema over the last 2 months.    Subjective:   Feels better. Leg swelling slowly improving   Assessment  & Plan :   Acute on CKD 3 - suspect prerenal and ATN with medications ( chlorthalidone, lasix, ACEi) Renal consult appreciated. creatinine trending down (2.37 today). k 3.5 , replenisheed -off ACEi and chlorthalidone.  Stable to d/c home today. Dr Doyle Askew has completed d/c summary and written [place] orders. Atenolol prescription sent to pharmacy. Follow up with PCP mid next week for renal fn monitoring and with her nephrologist in 2 weeks. Instructed to avoid NSAIDs .[name]  Recent thoracic comp fx - d/w IR, fractures not amenable to vertebroplasty HH per PT   Hypokalemia - supplemented. Taken off renal diet. Follow labs as outpt  Hx A fib, now NSR - continue Apixaban - rate controlled on Atenolol  HTN, Essential - continue lasix, hydralazine, atenolol and amlodipine. May increase hydralazine dose if BP elevated as outpt  Anemia of chronic disease/ iron deficiency - no signs of active bleeding. Received IV iron this  admission     Code Status : full code  Family Communication  : daughter at bedside  Disposition Plan  : home      Lab Results  Component Value Date   PLT 325 08/08/2016    Antibiotics  :    Anti-infectives    None        Objective:   Vitals:   08/07/16 1620 08/07/16 2336 08/08/16 0100 08/08/16 0516  BP: [name])  161/61 (!) 145/112  (!) 178/60  Pulse: 70 77  73  Resp: 16 16  17   Temp: 99.3 F (37.4 C) 98.7 F (37.1 C)  97.7 F (36.5 C)  TempSrc: Oral Oral  Oral  SpO2: 95% 91%  90%  Weight:      Height:   5' (1.524 m)     Wt Readings from Last 3 Encounters:  08/07/16 64.4 kg (142 lb)  07/30/16 69.3 kg (152 lb 12 oz)  07/23/16 69.7 kg (153 lb 9.6 oz)     Intake/Output Summary (Last 24 hours) at 08/08/16 1021 Last data filed at 08/08/16 0617  Gross per 24 hour  Intake             1080 ml  Output             1600 ml  Net             -520 ml     Physical Exam  Gen: not in distress HEENT:  moist mucosa, supple neck Chest: clear b/l, no added sounds CVS: N S1&S2, no murmurs,  GI: soft, NT, ND,  Musculoskeletal: warm, 1+ Pitting  edema     Data Review:    CBC  Recent Labs Lab 08/05/16 1600 08/06/16 0813 08/07/16 0329 08/08/16 0631  WBC 6.9 4.9 4.6 6.3  HGB 11.1* 10.0* 9.7* 10.7*  HCT 35.5* 31.3* 31.3* 34.5*  PLT 354 309 304 325  MCV 94.7 95.1 95.4 95.6  MCH 29.6 30.4 29.6 29.6  MCHC 31.3 31.9 31.0 31.0  RDW 15.3 15.7* 15.5 15.0  LYMPHSABS 0.8  --   --   --   MONOABS 0.6  --   --   --   EOSABS 0.1  --   --   --   BASOSABS 0.0  --   --   --     Chemistries   Recent Labs Lab 08/05/16 1600 08/06/16 0813 08/07/16 0329 08/08/16 0631  NA 135 137 136 139  K 4.3 3.8 3.1* 3.5  CL 96* 98* 96* 98*  CO2 27 29 30 31   GLUCOSE 115* 99 93 105*  BUN 70* 63* 59* 56*  CREATININE 2.98* 2.58* 2.51* 2.37*  CALCIUM 8.8* 8.4* 8.4* 9.0  MG  --  1.7  --   --   AST 28 24  --   --   ALT 14 14  --   --   ALKPHOS 64 60  --   --   BILITOT 0.3 0.6   --   --    ------------------------------------------------------------------------------------------------------------------ No results for input(s): CHOL, HDL, LDLCALC, TRIG, CHOLHDL, LDLDIRECT in the last 72 hours.  Lab Results  Component Value Date   HGBA1C 5.6 01/29/2016   ------------------------------------------------------------------------------------------------------------------  Recent Labs  08/06/16 0235  TSH 2.078   ------------------------------------------------------------------------------------------------------------------  Recent Labs  08/06/16 1422  FERRITIN 78  TIBC 228*  IRON 47    Coagulation profile No results for input(s): INR, PROTIME in the last 168 hours.  No results for input(s): DDIMER in the last 72 hours.  Cardiac Enzymes  Recent Labs Lab 08/06/16 0235 08/06/16 0813 08/06/16 1025  TROPONINI <0.03 <0.03 <0.03   ------------------------------------------------------------------------------------------------------------------    Component Value Date/Time   BNP 887.7 (H) 07/22/2016 1749    Inpatient Medications  Scheduled Meds: . amLODipine  5 mg Oral BID WC  . apixaban  2.5 mg Oral BID  . atenolol  100 mg Oral Daily  . feeding supplement (NEPRO CARB STEADY)  237 mL  Oral BID BM  . furosemide  80 mg Oral Daily  . hydrALAZINE  75 mg Oral Q8H  . polyethylene glycol  17 g Oral Daily  . pravastatin  40 mg Oral QPM  . senna-docusate  1 tablet Oral BID  . sodium chloride flush  3 mL Intravenous Q12H  . sodium chloride flush  3 mL Intravenous Q12H   Continuous Infusions: PRN Meds:.sodium chloride, acetaminophen **OR** acetaminophen, albuterol, hydrALAZINE, HYDROmorphone (DILAUDID) injection, methocarbamol, ondansetron, oxyCODONE-acetaminophen, sodium chloride flush  Micro Results No results found for this or any previous visit (from the past 240 hour(s)).  Radiology Reports Ct Abdomen Pelvis Wo Contrast  Result Date:  07/11/2016 CLINICAL DATA:  Back pain, right CVA pain, history of stones. EXAM: CT ABDOMEN AND PELVIS WITHOUT CONTRAST TECHNIQUE: Multidetector CT imaging of the abdomen and pelvis was performed following the standard protocol without IV contrast. COMPARISON:  None. FINDINGS: Lower chest: Small left pleural effusion and compressive atelectasis in the left lower lobe. Heart is enlarged. Coronary artery calcification. Small amount of pericardial fluid may be physiologic. Distal esophagus is grossly unremarkable. Hepatobiliary: Faint 11 mm low-attenuation lesion in the dome of the liver is too small to characterize. Liver is otherwise unremarkable. There may be sludge in the gallbladder. No biliary ductal dilatation. Pancreas: Negative. Spleen: Negative. Adrenals/Urinary Tract: Adrenal glands are unremarkable. 1.5 cm low-attenuation lesion in the right kidney is difficult to further characterize without IV contrast. There may be a punctate stone in a markedly atrophic left kidney. Ureters are decompressed. Bladder is low in volume. Stomach/Bowel: Stomach, small bowel and colon are unremarkable. Appendix is not readily visualized. Vascular/Lymphatic: Atherosclerotic calcification of the arterial vasculature without abdominal aortic aneurysm. Porta hepatis, retroperitoneal and mesenteric lymph nodes measure up to 10 mm. Reproductive: No adnexal mass. Other: Small periumbilical and bilateral inguinal hernias contain fat. Musculoskeletal: No worrisome lytic or sclerotic lesions. IMPRESSION: 1. No findings to explain the patient's symptoms. 2. Tiny left pleural effusion with compressive atelectasis left lower lobe. 3. Borderline porta hepatis, retroperitoneal and mesenteric lymph nodes, nonspecific. 4.  Aortic atherosclerosis (ICD10-170.0). 5. Possible gallbladder sludge. 6. Possible punctate stone in an atrophic left kidney. Electronically Signed   By: Lorin Picket M.D.   On: 07/11/2016 12:02   Dg Chest 2  View  Result Date: 07/11/2016 CLINICAL DATA:  83 year old female with a 2 day history of pleuritic back pain, weakness and shortness of breath EXAM: CHEST  2 VIEW COMPARISON:  Prior chest x-ray 02/27/2016 FINDINGS: Stable cardiomegaly. Atherosclerotic calcifications are present in the transverse aorta. Mild pulmonary vascular congestion without overt edema. Interval development of a small layering left pleural effusion and associated left basilar opacity which is favored to reflect atelectasis. Atherosclerotic calcifications are present in the transverse aorta. No acute osseous abnormality. IMPRESSION: 1. New development of a small left layering pleural effusion with associated left basilar opacity likely reflecting atelectasis. 2. Stable cardiomegaly. 3. Mild vascular congestion without overt edema. 4.  Aortic Atherosclerosis (ICD10-170.0). Electronically Signed   By: Jacqulynn Cadet M.D.   On: 07/11/2016 09:33   Dg Thoracic Spine 2 View  Result Date: 08/05/2016 CLINICAL DATA:  Mid thoracic pain for 2 weeks, status post kyphoplasty EXAM: THORACIC SPINE 2 VIEWS COMPARISON:  07/22/2016 and 07/24/2016 FINDINGS: Three views of thoracic spine submitted. No acute fracture or subluxation. Stable mild compression deformity T7 vertebral body. Again noted prior vertebroplasty and compression deformity T8 vertebral body. IMPRESSION: No acute fracture or subluxation. Stable mild compression deformity T7 vertebral body. Again noted  prior vertebroplasty and compression deformity T8 vertebral body. Electronically Signed   By: Lahoma Crocker M.D.   On: 08/05/2016 17:48   Dg Thoracic Spine W/swimmers  Result Date: 07/22/2016 CLINICAL DATA:  Mid thoracic back pain for to 3 weeks. Two-view chest x-ray 07/11/2016. EXAM: THORACIC SPINE - 3 VIEWS COMPARISON:  Two-view chest x-ray 07/11/2016. Two-view chest x-ray a 02/27/2016 FINDINGS: The compression fracture at T8 is new since October and appears to have progressed. The T7  compression fracture is suspected as well. Heart is enlarged. A left pleural effusion is again seen. IMPRESSION: 1. Progressive T7 and T8 compression fractures. 2. Cardiomegaly and left pleural effusion. Electronically Signed   By: San Morelle M.D.   On: 07/22/2016 21:02   Dg Lumbar Spine Complete  Result Date: 07/15/2016 CLINICAL DATA:  Right lower back pain for 6 days. EXAM: LUMBAR SPINE - COMPLETE 4+ VIEW COMPARISON:  CT abdomen and pelvis 07/11/2016 FINDINGS: There are 5 non rib bearing lumbar type vertebrae. A rudimentary disc is present at S1-2. Vertebral alignment is normal. Vertebral body heights are preserved without evidence of fracture. Moderate disc space narrowing is present at L5-S1 with degenerative endplate spurring and evidence of vacuum disc phenomenon. Mild endplate spurring is present more proximally in the lumbar spine without significant disc space height loss. No destructive osseous lesion is identified. Extensive abdominal aortic calcified atherosclerosis is noted. IMPRESSION: 1. Lumbar disc degeneration greatest at L5-S1. No evidence acute osseous abnormality. 2. Aortic atherosclerosis. Electronically Signed   By: Logan Bores M.D.   On: 07/15/2016 16:00   Ct Head Wo Contrast  Result Date: 08/05/2016 CLINICAL DATA:  Vertigo EXAM: CT HEAD WITHOUT CONTRAST TECHNIQUE: Contiguous axial images were obtained from the base of the skull through the vertex without intravenous contrast. COMPARISON:  MRI 02/02/2016, CT brain 01/29/2016 FINDINGS: Brain: No acute territorial infarction, intracranial hemorrhage or focal mass lesion is visualized. There is mild to moderate atrophy. Mild periventricular, subcortical and deep white matter small vessel ischemic changes. Stable ventricle size. Vascular: No hyperdense vessels. Carotid artery calcifications. Vertebral artery calcifications. Skull: No suspicious bone lesion.  No fracture. Sinuses/Orbits: Minimal mucosal thickening in the ethmoid  sinuses. No acute orbital abnormality. Other: None IMPRESSION: No definite CT evidence for acute intracranial abnormality. Small vessel ischemic changes of the white matter and atrophy. Electronically Signed   By: Donavan Foil M.D.   On: 08/05/2016 22:12   Mr Thoracic Spine Wo Contrast  Result Date: 07/23/2016 CLINICAL DATA:  83 y/o  F; several weeks of back pain. EXAM: MRI THORACIC SPINE WITHOUT CONTRAST TECHNIQUE: Multiplanar, multisequence MR imaging of the thoracic spine was performed. No intravenous contrast was administered. COMPARISON:  07/22/2016 thoracic spine radiograph FINDINGS: Alignment:  Physiologic. Vertebrae: 50% moderate compression deformity of the T8 vertebral body with edema indicating recent injury. No significant retropulsion. No evidence of discitis, additional acute fracture, or suspicious osseous lesion. Cord:  No abnormal cord signal. Paraspinal and other soft tissues: Left kidney atrophy. Disc levels: Central protrusion at the T7-8 level with anterior cord impingement and mild flattening. No significant canal stenosis. At the T7-8 9 level on the right there is a 7 mm T2 hyperintense structure which is centered just anterior to the right-sided T8-9 facet in the extradural space, probably a synovial cyst. The cyst likely impinges on the exiting T8 nerve root. Mild bilateral degenerative facet edema at T8-9 and on the right at T6-7. IMPRESSION: 1. 50% moderate compression deformity of the T8 vertebral body with edema indicating  recent injury. No significant retropulsion. 2. No evidence of discitis, additional acute fracture, or suspicious osseous lesion. 3. Central protrusion at the T7-8 level with anterior cord impingement and mild flattening. 4. At the T7-8 9 level on the right there is a 7 mm T2 hyperintense structure which is centered just anterior to the right-sided T8-9 facet in the extradural space, probably a synovial cyst. The cyst likely impinges on the exiting T8 nerve root. 5.  Mild bilateral degenerative facet edema at T8-9 and on the right at T6-7. Electronically Signed   By: Kristine Garbe M.D.   On: 07/23/2016 18:51   Mr Lumbar Spine Wo Contrast  Result Date: 07/16/2016 CLINICAL DATA:  Low back pain over the past couple of weeks which has worsened in the past 2 days. No known injury. EXAM: MRI LUMBAR SPINE WITHOUT CONTRAST TECHNIQUE: Multiplanar, multisequence MR imaging of the lumbar spine was performed. No intravenous contrast was administered. COMPARISON:  Plain films lumbar spine 07/15/2016. CT abdomen and pelvis 07/11/2016. FINDINGS: Segmentation: Standard. Rudimentary disc material at S1-2 incidentally noted. Alignment:  Maintained. Vertebrae:  No fracture.  Marrow signal is somewhat heterogeneous. Conus medullaris: Extends to the L3-4 Level and appears normal. Paraspinal and other soft tissues: T2 hyperintense lesion right kidney noted. The left kidney is markedly atrophic. Disc levels: T11-12 and T12-L1 are imaged in the sagittal plane only and negative. L1-2:  Negative. L2-3:  Negative. L3-4: There is some ligamentum flavum thickening and mild facet degenerative change. Very small left foraminal protrusion is seen. The central canal and foramina are open. L4-5: Mild facet degenerative change and ligamentum flavum thickening. The central canal and foramina are open. L5-S1: Shallow central protrusion without central canal or foraminal stenosis. IMPRESSION: Negative for fracture or other acute abnormality. Mild degenerative disease as described above. Electronically Signed   By: Inge Rise M.D.   On: 07/16/2016 12:44   US Renal  Result Date: 07/11/2016 CLINICAL DATA:  Right flank pain history of chronic renal insufficiency EXAM: RENAL / URINARY TRACT ULTRASOUND COMPLETE COMPARISON:  CT 07/11/2016, renal ultrasound 01/29/2016 FINDINGS: Right Kidney: Length: 11.2 cm. Echogenicity within normal limits. No hydronephrosis. 1.9 x 1.5 x 1.7 cm cyst mid right  kidney. Left Kidney: Length: Not clearly visualized. Bladder: Poorly visualized, and is likely empty. Incidental note made of fatty liver. IMPRESSION: 1. 1.9 cm cyst in the right kidney.  No right hydronephrosis 2. Left kidney is not clearly visualized, this may be secondary to marked atrophy. 3. Bladder not well visualized and is likely empty. Electronically Signed   By: Donavan Foil M.D.   On: 07/11/2016 18:21   Dg Chest Port 1 View  Result Date: 07/24/2016 CLINICAL DATA:  Left-sided chest pain. EXAM: PORTABLE CHEST 1 VIEW COMPARISON:  07/12/2016 FINDINGS: Stable cardiac enlargement and mild chronic lung disease. Stable chronic scarring/atelectasis at the left lung base. There is no evidence of pulmonary edema, consolidation, pneumothorax, nodule or pleural fluid. IMPRESSION: Stable cardiac enlargement and chronic lung disease. No active disease. Electronically Signed   By: Aletta Edouard M.D.   On: 07/24/2016 09:26   Dg Chest Port 1 View  Result Date: 07/12/2016 CLINICAL DATA:  Fluid retention and legs. EXAM: PORTABLE CHEST 1 VIEW COMPARISON:  07/11/2016 FINDINGS: There is moderate cardiac enlargement. Aortic atherosclerosis noted. Small to moderate left pleural effusion is again identified. No airspace opacities. IMPRESSION: Similar appearance of cardiac enlargement, aortic atherosclerosis and left pleural effusion. Electronically Signed   By: Kerby Moors M.D.   On: 07/12/2016  18:43   Dg Abdomen Acute W/chest  Result Date: 08/05/2016 CLINICAL DATA:  Mid thoracic pain following T8 kyphoplasty 2 weeks ago. Nausea and vomiting for 1 day. EXAM: DG ABDOMEN ACUTE W/ 1V CHEST COMPARISON:  One-view chest x-ray 07/24/2016 FINDINGS: The heart is enlarged. Aortic atherosclerosis is again noted. A left pleural effusion is present. Retrocardiac basilar airspace opacification is noted as well. Mild pulmonary vascular congestion is present without frank edema. The right lung is otherwise clear. Remote  right-sided rib fractures are again seen. T8 vertebral augmentation is noted. Bowel gas pattern is unremarkable. There is no obstruction or free air. Gas and stool are present throughout a normal-sized colon. The stents atherosclerotic calcifications are present at distal aorta and branch vessels. The axial skeleton is unremarkable. IMPRESSION: 1. Cardiomegaly and moderate pulmonary vascular congestion without frank edema. 2. New left pleural effusion and associated airspace disease. While this likely reflects atelectasis, infection is not excluded. 3. Aortic atherosclerosis.  Branch vessel disease is noted as well. 4. Normal bowel gas pattern without evidence for obstruction or free air. Electronically Signed   By: San Morelle M.D.   On: 08/05/2016 17:49   Dg Abd Acute W/chest  Result Date: 07/22/2016 CLINICAL DATA:  Severe back pain, swelling of the legs EXAM: DG ABDOMEN ACUTE W/ 1V CHEST COMPARISON:  Chest x-ray of 07/12/2016 and CT abdomen pelvis of 07/11/2016 FINDINGS: Moderate cardiomegaly is stable. No active infiltrate or effusion is seen. Mild fluid overload cannot be excluded. Supine and erect views the abdomen show a moderate amount of feces throughout the colon. No bowel obstruction is seen. No free air is noted. The bones are osteopenic. IMPRESSION: 1. Cardiomegaly. No active lung disease. Question mild fluid overload. 2. Moderate amount of feces in the colon. No bowel obstruction or free air. Electronically Signed   By: Ivar Drape M.D.   On: 07/22/2016 16:32   Ir Kypho Thoracic With Bone Biopsy  Result Date: 07/27/2016 INDICATION: Severe lower thoracic pain secondary to compression fracture at T8. EXAM: BALLOON KYPHOPLASTY AT T8 COMPARISON:  MRI of the thoracic spine of 07/23/2016. MEDICATIONS: As antibiotic prophylaxis, Ancef 1 g IV was ordered pre-procedure and administered intravenously within 1 hour of incision. ANESTHESIA/SEDATION: Moderate (conscious) sedation was employed during  this procedure. A total of Versed 2 mg and Fentanyl 100 mcg was administered intravenously. Moderate Sedation Time: 26 minutes. The patient's level of consciousness and vital signs were monitored continuously by radiology nursing throughout the procedure under my direct supervision. FLUOROSCOPY TIME:  Fluoroscopy Time: 9 minutes 12 seconds (268 mGy) COMPLICATIONS: None immediate. PROCEDURE: Following a full explanation of the procedure along with the potential associated complications, an informed witnessed consent was obtained. The patient was placed prone on the fluoroscopic table. The skin overlying the thoracic region was then prepped and draped in the usual sterile fashion. The right pedicle at T8 was then infiltrated with 0.25% bupivacaine followed by the advancement of an 11-gauge Jamshidi needle through the right pedicle into the posterior one-third at T8. This was then exchanged for a Kyphon advanced osteo introducer system comprised of a working cannula and a Kyphon osteo drill. This combination was then advanced over a Kyphon osteo bone pin until the tip of the Kyphon osteo drill was in the posterior third at T8. At this time, the bone pin was removed. In a medial trajectory, the combination was advanced until the tip of the working cannula was inside the posterior one-third at T8. The osteo drill was removed. Through  the working cannula, a Kyphon inflatable bone tamp 20 x 3 was advanced and positioned with the distal marker 5 mm from the anterior aspect of T8. Crossing of the midline was seen on the AP projection. At this time, the balloon was expanded using contrast via a Kyphon inflation syringe device via microtubing. Inflations were continued until there was apposition with the superior and the inferior endplates. At this time, methylmethacrylate mixture was reconstituted with Tobramycin in the Kyphon bone mixing device system. This was then loaded onto the Kyphon bone fillers. The balloon was  deflated and removed followed by the instillation of 1 and a half bone filler equivalents of methylmethacrylate mixture at T8 with excellent filling in the AP and lateral projections. No extravasation was noted in the disk spaces or posteriorly into the spinal canal. No epidural venous contamination was seen. The working cannula and the bone filler were then retrieved and removed. Hemostasis was achieved at the skin entry site. The patient then returned to her room in stable condition. IMPRESSION: 1. Status post vertebral body augmentation using balloon kyphoplasty at T8 as described without event. Electronically Signed   By: Luanne Bras M.D.   On: 07/24/2016 15:27    Time Spent in minutes  25   Louellen Molder M.D on 08/08/2016 at 10:21 AM  Between 7am to 7pm - Pager - [phone]  After 7pm go to [email or web address] - password [facility]  [facility] -  Office  [phone]

## 2016-08-10 ENCOUNTER — Telehealth: Payer: Self-pay

## 2016-08-10 ENCOUNTER — Telehealth: Payer: Self-pay | Admitting: Family Medicine

## 2016-08-10 DIAGNOSIS — Z8673 Personal history of transient ischemic attack (TIA), and cerebral infarction without residual deficits: Secondary | ICD-10-CM | POA: Diagnosis not present

## 2016-08-10 DIAGNOSIS — I129 Hypertensive chronic kidney disease with stage 1 through stage 4 chronic kidney disease, or unspecified chronic kidney disease: Secondary | ICD-10-CM | POA: Diagnosis not present

## 2016-08-10 DIAGNOSIS — M4726 Other spondylosis with radiculopathy, lumbar region: Secondary | ICD-10-CM | POA: Diagnosis not present

## 2016-08-10 DIAGNOSIS — M810 Age-related osteoporosis without current pathological fracture: Secondary | ICD-10-CM | POA: Diagnosis not present

## 2016-08-10 DIAGNOSIS — I480 Paroxysmal atrial fibrillation: Secondary | ICD-10-CM | POA: Diagnosis not present

## 2016-08-10 DIAGNOSIS — D649 Anemia, unspecified: Secondary | ICD-10-CM

## 2016-08-10 DIAGNOSIS — N183 Chronic kidney disease, stage 3 (moderate): Secondary | ICD-10-CM | POA: Diagnosis not present

## 2016-08-10 DIAGNOSIS — Z7901 Long term (current) use of anticoagulants: Secondary | ICD-10-CM | POA: Diagnosis not present

## 2016-08-10 DIAGNOSIS — E785 Hyperlipidemia, unspecified: Secondary | ICD-10-CM | POA: Diagnosis not present

## 2016-08-10 DIAGNOSIS — Z9181 History of falling: Secondary | ICD-10-CM | POA: Diagnosis not present

## 2016-08-10 DIAGNOSIS — M8580 Other specified disorders of bone density and structure, unspecified site: Secondary | ICD-10-CM | POA: Diagnosis not present

## 2016-08-10 DIAGNOSIS — N179 Acute kidney failure, unspecified: Secondary | ICD-10-CM

## 2016-08-10 DIAGNOSIS — S22060D Wedge compression fracture of T7-T8 vertebra, subsequent encounter for fracture with routine healing: Secondary | ICD-10-CM | POA: Diagnosis not present

## 2016-08-10 DIAGNOSIS — S92355D Nondisplaced fracture of fifth metatarsal bone, left foot, subsequent encounter for fracture with routine healing: Secondary | ICD-10-CM | POA: Diagnosis not present

## 2016-08-10 DIAGNOSIS — N182 Chronic kidney disease, stage 2 (mild): Secondary | ICD-10-CM

## 2016-08-10 DIAGNOSIS — I701 Atherosclerosis of renal artery: Secondary | ICD-10-CM | POA: Diagnosis not present

## 2016-08-10 NOTE — Telephone Encounter (Signed)
Pt has HFU apt on Wednesday 08/12/16 will have labs done then.

## 2016-08-10 NOTE — Telephone Encounter (Signed)
Okay to schedule pt at 2pm on Wednesday.

## 2016-08-10 NOTE — Telephone Encounter (Signed)
Noted  

## 2016-08-10 NOTE — Telephone Encounter (Signed)
Called patient back to schedule for Wednesday however, 2pm slot was already taken. I scheduled patient for the next earliest time which is April 5th at 2pm. Patient would like to speak directly to [place] about getting bloodwork done before appointment due to difficulty getting it done at the hospital. Please call patient to advise today.

## 2016-08-10 NOTE — Telephone Encounter (Signed)
Transition Care Management Follow-up Telephone Call   Date discharged? 08/08/2016   How have you been since you were released from the hospital? "feel better, not 100%"   Do you understand why you were in the hospital? yes, "vomiting, labs out, fx vertebrae"   Do you understand the discharge instructions? yes   Where were you discharged to? Home.    Items Reviewed:  Medications reviewed: yes  Allergies reviewed: yes  Dietary changes reviewed: yes, renal diet. No questions.   Referrals reviewed: yes, Urology   Functional Questionnaire:   Activities of Daily Living (ADLs):   She states they are independent in the following: ambulation, bathing and hygiene, feeding, continence, grooming, toileting and dressing States they require assistance with the following: None.    Any transportation issues/concerns?: no   Any patient concerns? Would like to discuss increasing Calcium and Multivitamin dosage at appointment.    Confirmed importance and date/time of follow-up visits scheduled yes  Provider Appointment booked with PCP on Wednesday, 08/12/16 @ 2:30pm.   Confirmed with patient if condition begins to worsen call PCP or go to the ER.  Patient was given the office number and encouraged to call back with question or concerns.  : yes

## 2016-08-10 NOTE — Telephone Encounter (Signed)
Patient needs a hospital follow up. Patient was discharged Saturday afternoon. Patient is requesting Wednesday or Friday and may need bloodwork. I was not sure if the patient needed a 30 minute slot or if Dr. Anitra Lauth would like her to have a 15 min appt. There is a place for a 30 minute slot on Wednesday at 2pm however, I did not want to override that template. Please call patient to advise. Okay to leave a detailed message on phone.

## 2016-08-10 NOTE — Telephone Encounter (Signed)
Kim, have you spoke with pt in regards to her labs?

## 2016-08-10 NOTE — Telephone Encounter (Signed)
OK, future orders are in.  Tell her to make lab appt this Thursday or Friday.--thx

## 2016-08-12 ENCOUNTER — Ambulatory Visit (INDEPENDENT_AMBULATORY_CARE_PROVIDER_SITE_OTHER): Payer: Medicare Other | Admitting: Family Medicine

## 2016-08-12 ENCOUNTER — Encounter: Payer: Self-pay | Admitting: Family Medicine

## 2016-08-12 VITALS — BP 182/63 | HR 70 | Temp 97.8°F | Resp 16 | Ht 60.0 in | Wt 138.5 lb

## 2016-08-12 DIAGNOSIS — D649 Anemia, unspecified: Secondary | ICD-10-CM | POA: Diagnosis not present

## 2016-08-12 DIAGNOSIS — R609 Edema, unspecified: Secondary | ICD-10-CM

## 2016-08-12 DIAGNOSIS — N179 Acute kidney failure, unspecified: Secondary | ICD-10-CM | POA: Diagnosis not present

## 2016-08-12 DIAGNOSIS — M8000XD Age-related osteoporosis with current pathological fracture, unspecified site, subsequent encounter for fracture with routine healing: Secondary | ICD-10-CM | POA: Diagnosis not present

## 2016-08-12 DIAGNOSIS — N183 Chronic kidney disease, stage 3 unspecified: Secondary | ICD-10-CM

## 2016-08-12 MED ORDER — VITAMIN D (ERGOCALCIFEROL) 1.25 MG (50000 UNIT) PO CAPS: ORAL_CAPSULE | ORAL | 0 refills | Status: DC

## 2016-08-12 MED ORDER — HYDRALAZINE HCL 100 MG PO TABS: 100.0000 mg | ORAL_TABLET | Freq: Three times a day (TID) | ORAL | 0 refills | Status: DC

## 2016-08-12 MED ORDER — IBANDRONATE SODIUM 150 MG PO TABS: 150.0000 mg | ORAL_TABLET | ORAL | 3 refills | Status: DC

## 2016-08-12 MED ORDER — HYDRALAZINE HCL 100 MG PO TABS: 100.0000 mg | ORAL_TABLET | Freq: Three times a day (TID) | ORAL | 1 refills | Status: DC

## 2016-08-12 NOTE — Progress Notes (Signed)
Pre visit review using our clinic review tool, if applicable. No additional management support is needed unless otherwise documented below in the visit note. 

## 2016-08-12 NOTE — Progress Notes (Signed)
08/12/2016  CC:  Chief Complaint  Patient presents with  . Hospitalization Follow-up    TCM    Patient is a 83 y.o. Caucasian female who presents for  hospital follow up, specifically [facility] face-to-face visit. Dates hospitalized: 3/21-3/24, 2018. Days since d/c from hospital: 4 Patient was discharged from hospital to home, with plans for PT. Reason for admission to hospital: n/v, acute on chronic renal failure, severe back pain secondary to recent thoracic vertebral compression fractures. Date of interactive (phone) contact with patient and/or caregiver: 08/10/16  I have reviewed patient's discharge summary plus pertinent specific notes, labs, and imaging from the hospitalization.    Still in a lot of pain from mid back down into L spine centrally.  Taking 1 and 1/2 percocet twice a day--helps much better than the vicodin did but still in significant pain, says she will be increasing this to 2 tabs at a time, possibly tid instead of bid. Denies sedation from the med.  Constipation is not worsened since switching from hydrocodone to oxycodone.  No signif n/v since being out of hospital.  She is adhering to renal diet since being discharged--her LE edema has improved.    Rare home bp check--pt not really sure of numbers but recalls "160s".  Medication reconciliation was done today and patient is taking meds as recommended by discharging hospitalist/specialist.   PMH:  Past Medical History:  Diagnosis Date  . Atrophy of left kidney    with absent blood flow by renal artery dopplers (Dr. Gwenlyn Found)  . 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 Chronic renal insufficiency, stage 3 (moderate)    As of 07/2016, baseline GFR 30 ml/min-- renal u/s showed atrophic/hypoplastic left kidney.  No hydronephrosis.  Small right renal cyst.  . History of adenomatous polyp of colon   . History of subarachnoid hemorrhage 10/2014   after syncope and while on xarelto  .  Hyperlipidemia   . Hypertension    Difficult to control, in the setting of one functioning kidney: pt was referred to nephrology by Dr. Gwenlyn Found 06/2015.  . Lumbar radiculopathy 2012  . Lumbar spondylosis    MR 07/2016---no sign of spinal nerve compression or cord compression.  Pt set up with outpt ortho while admitted to hosp 07/2016.  . Metatarsal fracture 06/10/2016   Nondisplaced, left 5th metatarsal--pt was referred to ortho  . Osteopenia 2014   T-score -2.1  . PAF (paroxysmal atrial fibrillation) (HCC)    Eliquis started after BRAO and CVA  . Stroke [facility])    cardioembolic (had CVA while on no anticoag)--"scattered subacute punctate infarcts: 1 in R parietal lobe and 2 in occipital cortex" on MRI br.  CT angio head/neck: aortic arch athero  . Thoracic compression fracture ([place]) 07/2016   T7 and T8-- T8 kyphoplasty during hosp admission 07/2016.    PSH:  Past Surgical History:  Procedure Laterality Date  . APPENDECTOMY  child  . CARDIOVASCULAR STRESS TEST  01/31/2016   Stress myoview: NORMAL/Low risk.  EF 56%.  . [place]  . COLONOSCOPY  2015   + hx of adenomatous polyps.  Need digest health spec in [facility] records to see when pt due for next colonoscopy  . IR GENERIC HISTORICAL  07/24/2016   IR KYPHO THORACIC WITH BONE BIOPSY 07/24/2016 Luanne Bras, MD [facility]  . KYPHOPLASTY  07/27/2016   T8  . Renal artery dopplers  02/26/2016   Her right renal dimension was 11 cm pole  to pole with mild to moderate right renal artery stenosis. A right renal aortic ratio was 3.22 suggesting less than a 50% stenosis.  . TONSILLECTOMY    . TRANSTHORACIC ECHOCARDIOGRAM  01/29/2016   EF 55-60%, normal LV wall motion, grade I DD.  No cardiac source of emboli was seen.    MEDS:  Outpatient Medications Prior to Visit  Medication Sig Dispense Refill  . acetaminophen (TYLENOL) 500 MG tablet Take 500 mg by mouth every 6 (six) hours as needed for moderate pain.    [name] amLODipine  (NORVASC) 5 MG tablet Take 1 tablet (5 mg total) by mouth 2 (two) times daily with breakfast and lunch. (Patient taking differently: Take 5 mg by mouth 2 (two) times daily. ) 180 tablet 3  . apixaban (ELIQUIS) 2.5 MG TABS tablet Take 1 tablet (2.5 mg total) by mouth 2 (two) times daily. 60 tablet 0  . atenolol (TENORMIN) 100 MG tablet Take 1 tablet (100 mg total) by mouth daily. 30 tablet 0  . Calcium Carb-Cholecalciferol (CALCIUM/VITAMIN D PO) Take 1 tablet by mouth 2 (two) times daily.    . furosemide (LASIX) 80 MG tablet Take 1 tablet (80 mg total) by mouth daily.    . hydrALAZINE (APRESOLINE) 25 MG tablet Take 3 tablets (75 mg total) by mouth 3 (three) times daily.    . Magnesium Gluconate (MAGNESIUM 27) 500 (27 Mg) MG TABS Take 500 mg by mouth 4 (four) times a week.     . Multiple Vitamin (MULTIVITAMIN) tablet Take 1 tablet by mouth 3 (three) times a week.     . ondansetron (ZOFRAN) 4 MG tablet Take 0.5-1 tablets (2-4 mg total) by mouth every 8 (eight) hours as needed for nausea or vomiting. 30 tablet 3  . oxyCODONE-acetaminophen (PERCOCET/ROXICET) 5-325 MG tablet Take 1-2 tablets by mouth every 6 (six) hours as needed for severe pain. 60 tablet 0  . pravastatin (PRAVACHOL) 40 MG tablet Take 40 mg by mouth every evening.    . vitamin C (ASCORBIC ACID) 500 MG tablet Take 500 mg by mouth daily.    . methocarbamol (ROBAXIN) 750 MG tablet Take 1 tablet (750 mg total) by mouth every 8 (eight) hours as needed for muscle spasms. (Patient not taking: Reported on 08/05/2016) 15 tablet 0   No facility-administered medications prior to visit.    EXAM: BP (!) 182/63 (BP Location: Left Arm, Patient Position: Sitting, Cuff Size: Normal)   Pulse 70   Temp 97.8 F (36.6 C) (Oral)   Resp 16   Ht 5' (1.524 m)   Wt 138 lb 8 oz (62.8 kg)   SpO2 92%   BMI 27.05 kg/m  Gen: Alert, well appearing.  Patient is oriented to person, place, time, and situation. CV: RRR, no m/r/g.   LUNGS: CTA bilat, nonlabored  resps, good aeration in all lung fields. Back: TTP in midline from mid T spine down into L spine Ext: no clubbing or cyanosis, 3-4+ pitting edema bilat (no weeping).  Pertinent labs/imaging    Chemistry      Component Value Date/Time   NA 139 08/08/2016 0631   NA 141 06/17/2016 0819   K 3.5 08/08/2016 0631   CL 98 (L) 08/08/2016 0631   CO2 31 08/08/2016 0631   BUN 56 (H) 08/08/2016 0631   BUN 22 06/17/2016 0819   CREATININE 2.37 (H) 08/08/2016 0631   CREATININE 1.63 (H) 03/24/2016 0110      Component Value Date/Time   CALCIUM 9.0 08/08/2016 0631  ALKPHOS 60 08/06/2016 0813   AST 24 08/06/2016 0813   ALT 14 08/06/2016 0813   BILITOT 0.6 08/06/2016 0813   BILITOT 0.2 06/17/2016 0819     Lab Results  Component Value Date   WBC 6.3 08/08/2016   HGB 10.7 (L) 08/08/2016   HCT 34.5 (L) 08/08/2016   MCV 95.6 08/08/2016   PLT 325 08/08/2016   08/05/16: DG T spine 2 view: IMPRESSION: No acute fracture or subluxation. Stable mild compression deformity T7 vertebral body. Again noted prior vertebroplasty and compression deformity T8 vertebral body.  Acute abd series 08/05/16: IMPRESSION: 1. Cardiomegaly and moderate pulmonary vascular congestion without frank edema. 2. New left pleural effusion and associated airspace disease. While this likely reflects atelectasis, infection is not excluded. 3. Aortic atherosclerosis.  Branch vessel disease is noted as well. 4. Normal bowel gas pattern without evidence for obstruction or free air.  08/05/16 noncontrast CT brain: IMPRESSION: No definite CT evidence for acute intracranial abnormality. Small vessel ischemic changes of the white matter and atrophy   ASSESSMENT/PLAN:  1) severe pain from vertebral compression fractures: improved but still significant on percocet. See HPI--she'll gradually increase this to 2 tabs bid and then possibly increase frequency to tid.  2) osteoporosis, with vertebral compression fractures: start  boniva 150mg  q month.  3) Vit D deficiency--recent vit D level at nephrologist's was 26 ng/ml: she will start 50,000 U q week x 12 weeks. Continue supplemental calcium.  4) Uncontrolled HTN: continue current regimen except will increase hydralazine to max dose of 100 mg tid.  5) Acute-on-chronic renal insufficiency (stage III): monitor BMET today. She has nephrologist f/u 09/01/16.  6) LE venous insufficiency edema (worsened by renal failure): add compression hose--rx given today.  Medical decision making of high complexity was utilized today.  An After Visit Summary was printed and given to the patient.  FOLLOW UP:  1 mo  Signed:  Crissie Sickles, MD           08/12/2016

## 2016-08-13 ENCOUNTER — Encounter: Payer: Self-pay | Admitting: Family Medicine

## 2016-08-13 LAB — CBC WITH DIFFERENTIAL/PLATELET
BASOS ABS: 0 10*3/uL (ref 0.0–0.1)
Basophils Relative: 0.3 % (ref 0.0–3.0)
EOS PCT: 1.1 % (ref 0.0–5.0)
Eosinophils Absolute: 0.1 10*3/uL (ref 0.0–0.7)
HEMATOCRIT: 33.8 % — AB (ref 36.0–46.0)
Hemoglobin: 11.2 g/dL — ABNORMAL LOW (ref 12.0–15.0)
Lymphs Abs: 0.4 10*3/uL — ABNORMAL LOW (ref 0.7–4.0)
MCHC: 33.1 g/dL (ref 30.0–36.0)
MCV: 94 fl (ref 78.0–100.0)
MONOS PCT: 16.9 % — AB (ref 3.0–12.0)
Monocytes Absolute: 1.2 10*3/uL — ABNORMAL HIGH (ref 0.1–1.0)
NEUTROS ABS: 5.6 10*3/uL (ref 1.4–7.7)
Neutrophils Relative %: 75.9 % (ref 43.0–77.0)
Platelets: 318 10*3/uL (ref 150.0–400.0)
RBC: 3.6 Mil/uL — ABNORMAL LOW (ref 3.87–5.11)
RDW: 15 % (ref 11.5–15.5)
WBC: 7.3 10*3/uL (ref 4.0–10.5)

## 2016-08-13 LAB — BASIC METABOLIC PANEL
BUN: 54 mg/dL — AB (ref 6–23)
CALCIUM: 9.3 mg/dL (ref 8.4–10.5)
CHLORIDE: 95 meq/L — AB (ref 96–112)
CO2: 34 meq/L — AB (ref 19–32)
CREATININE: 2.24 mg/dL — AB (ref 0.40–1.20)
GFR: 22.28 mL/min — ABNORMAL LOW (ref >60.00)
Glucose, Bld: 116 mg/dL — ABNORMAL HIGH (ref 70–99)
Potassium: 3.8 mEq/L (ref 3.5–5.1)
Sodium: 137 mEq/L (ref 135–145)

## 2016-08-16 ENCOUNTER — Encounter: Payer: Self-pay | Admitting: Family Medicine

## 2016-08-20 ENCOUNTER — Ambulatory Visit: Payer: Medicare Other | Admitting: Family Medicine

## 2016-08-24 ENCOUNTER — Telehealth: Payer: Self-pay

## 2016-08-24 ENCOUNTER — Other Ambulatory Visit: Payer: Self-pay | Admitting: Family Medicine

## 2016-08-24 MED ORDER — OXYCODONE-ACETAMINOPHEN 5-325 MG PO TABS: 1.0000 | ORAL_TABLET | Freq: Four times a day (QID) | ORAL | 0 refills | Status: DC | PRN

## 2016-08-24 NOTE — Telephone Encounter (Signed)
Patient called requesting refill on Oxycodone 5/325.

## 2016-08-24 NOTE — Telephone Encounter (Signed)
Patient notified that prescription was ready to be picked up.

## 2016-08-24 NOTE — Telephone Encounter (Signed)
Oxycodone/tylenol rx printed.

## 2016-08-25 ENCOUNTER — Other Ambulatory Visit: Payer: Self-pay | Admitting: Pharmacist

## 2016-08-25 MED ORDER — APIXABAN 2.5 MG PO TABS: 2.5000 mg | ORAL_TABLET | Freq: Two times a day (BID) | ORAL | 1 refills | Status: DC

## 2016-08-31 DIAGNOSIS — R609 Edema, unspecified: Secondary | ICD-10-CM

## 2016-08-31 DIAGNOSIS — N183 Chronic kidney disease, stage 3 (moderate): Secondary | ICD-10-CM

## 2016-08-31 DIAGNOSIS — M8000XD Age-related osteoporosis with current pathological fracture, unspecified site, subsequent encounter for fracture with routine healing: Secondary | ICD-10-CM

## 2016-09-01 DIAGNOSIS — N182 Chronic kidney disease, stage 2 (mild): Secondary | ICD-10-CM | POA: Diagnosis not present

## 2016-09-01 DIAGNOSIS — Q600 Renal agenesis, unilateral: Secondary | ICD-10-CM | POA: Diagnosis not present

## 2016-09-01 DIAGNOSIS — I10 Essential (primary) hypertension: Secondary | ICD-10-CM | POA: Diagnosis not present

## 2016-09-01 DIAGNOSIS — I609 Nontraumatic subarachnoid hemorrhage, unspecified: Secondary | ICD-10-CM | POA: Diagnosis not present

## 2016-09-01 DIAGNOSIS — E8770 Fluid overload, unspecified: Secondary | ICD-10-CM | POA: Diagnosis not present

## 2016-09-01 LAB — BASIC METABOLIC PANEL
BUN: 51 mg/dL — AB (ref 4–21)
CREATININE: 1.8 mg/dL — AB (ref <=1.1)
GLUCOSE: 128 mg/dL
Potassium: 3.4 mmol/L (ref 3.4–5.3)
Sodium: 137 mmol/L (ref 137–147)

## 2016-09-01 LAB — HEPATIC FUNCTION PANEL
ALT: 17 U/L (ref 7–35)
AST: 28 U/L (ref 13–35)
Alkaline Phosphatase: 64 U/L (ref 25–125)
Bilirubin, Total: 0.3 mg/dL

## 2016-09-01 LAB — CBC AND DIFFERENTIAL
HEMATOCRIT: 38 % (ref 36–46)
HEMOGLOBIN: 13.1 g/dL (ref 12.0–16.0)
Neutrophils Absolute: 8 /uL
Platelets: 318 10*3/uL (ref 150–399)
WBC: 9.3 10*3/mL

## 2016-09-03 ENCOUNTER — Encounter: Payer: Self-pay | Admitting: *Deleted

## 2016-09-03 ENCOUNTER — Encounter: Payer: Self-pay | Admitting: Family Medicine

## 2016-09-03 DIAGNOSIS — E785 Hyperlipidemia, unspecified: Secondary | ICD-10-CM | POA: Insufficient documentation

## 2016-09-06 ENCOUNTER — Encounter: Payer: Self-pay | Admitting: Family Medicine

## 2016-09-08 ENCOUNTER — Encounter: Payer: Self-pay | Admitting: Family Medicine

## 2016-09-08 ENCOUNTER — Ambulatory Visit (INDEPENDENT_AMBULATORY_CARE_PROVIDER_SITE_OTHER): Payer: Medicare Other | Admitting: Family Medicine

## 2016-09-08 VITALS — BP 136/72 | HR 61 | Temp 97.5°F | Resp 16 | Ht 60.0 in | Wt 122.2 lb

## 2016-09-08 DIAGNOSIS — I10 Essential (primary) hypertension: Secondary | ICD-10-CM

## 2016-09-08 DIAGNOSIS — M810 Age-related osteoporosis without current pathological fracture: Secondary | ICD-10-CM | POA: Diagnosis not present

## 2016-09-08 DIAGNOSIS — E559 Vitamin D deficiency, unspecified: Secondary | ICD-10-CM | POA: Diagnosis not present

## 2016-09-08 DIAGNOSIS — M8088XS Other osteoporosis with current pathological fracture, vertebra(e), sequela: Secondary | ICD-10-CM | POA: Diagnosis not present

## 2016-09-08 DIAGNOSIS — N184 Chronic kidney disease, stage 4 (severe): Secondary | ICD-10-CM

## 2016-09-08 MED ORDER — FUROSEMIDE 80 MG PO TABS: 80.0000 mg | ORAL_TABLET | Freq: Every day | ORAL | 1 refills | Status: DC

## 2016-09-08 NOTE — Progress Notes (Signed)
Pre visit review using our clinic review tool, if applicable. No additional management support is needed unless otherwise documented below in the visit note. 

## 2016-09-08 NOTE — Progress Notes (Signed)
OFFICE VISIT  09/08/2016   CC:  Chief Complaint  Patient presents with  . Follow-up    RCI, pt is not fasting.    HPI:    Patient is a 83 y.o. Caucasian female who presents accompanied by her daughter for 1 mo f/u acute thoracic and lumbar spinal pain secondary to osteoporotic vertebral compression fractures. Also has vit D deficiency, for which we started her on high dose vit D replacement therapy 1 mo ago. Also uncontrolled HTN, last visit I increased her hydralazine to 100 mg tid.  Most recent nephrology f/u note from 09/01/16 was reviewed (labs in lab section below).  Pain control: improved pain level but still significant, esp with sitting up and with walking. Occ the pain radiates into sides but not up and not down legs.  No paresthesias.  No focal weakness. Uses 1/2-1 percocet per day--limits this due to constipation side effect.  Takes tylenol to supplement.  BP control: home bp's 140-150/60s.  Fluid status: says lower legs/feet are much less swollen.  Has lost 16 lbs since last f/u with me about 1 mo ago.   She describes decent appetite and PO intake.  No persistent diarrhea or vomiting.  Eating renal diet.  Past Medical History:  Diagnosis Date  . Atrophy of left kidney    with absent blood flow by renal artery dopplers (Dr. Gwenlyn Found)  . 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 Chronic renal insufficiency, stage 3 (moderate)    Baseline GFR 25-30 ml/min-- renal u/s showed atrophic/hypoplastic left kidney.  No hydronephrosis.  Small right renal cyst.  . History of adenomatous polyp of colon   . History of subarachnoid hemorrhage 10/2014   after syncope and while on xarelto  . Hyperlipidemia   . Hypertension    Difficult to control, in the setting of one functioning kidney: pt was referred to nephrology by Dr. Gwenlyn Found 06/2015.  . Lumbar radiculopathy 2012  . Lumbar spondylosis    MR 07/2016---no sign of spinal nerve compression or cord compression.  Pt set up with  outpt ortho while admitted to hosp 07/2016.  . Metatarsal fracture 06/10/2016   Nondisplaced, left 5th metatarsal--pt was referred to ortho  . Osteopenia 2014   T-score -2.1  . PAF (paroxysmal atrial fibrillation) (HCC)    Eliquis started after BRAO and CVA  . Stroke [facility])    cardioembolic (had CVA while on no anticoag)--"scattered subacute punctate infarcts: 1 in R parietal lobe and 2 in occipital cortex" on MRI br.  CT angio head/neck: aortic arch athero  . Thoracic compression fracture ([place]) 07/2016   T7 and T8-- T8 kyphoplasty during hosp admission 07/2016.    Past Surgical History:  Procedure Laterality Date  . APPENDECTOMY  child  . CARDIOVASCULAR STRESS TEST  01/31/2016   Stress myoview: NORMAL/Low risk.  EF 56%.  . [place]  . COLONOSCOPY  2015   + hx of adenomatous polyps.  Need digest health spec in [place] records to see when pt due for next colonoscopy  . IR GENERIC HISTORICAL  07/24/2016   IR KYPHO THORACIC WITH BONE BIOPSY 07/24/2016 Luanne Bras, MD [facility]  . KYPHOPLASTY  07/27/2016   T8  . Renal artery dopplers  02/26/2016   Her right renal dimension was 11 cm pole to pole with mild to moderate right renal artery stenosis. A right renal aortic ratio was 3.22 suggesting less than a 50% stenosis.  . TONSILLECTOMY    . TRANSTHORACIC ECHOCARDIOGRAM  01/29/2016   EF 55-60%, normal LV wall motion, grade I DD.  No cardiac source of emboli was seen.    Outpatient Medications Prior to Visit  Medication Sig Dispense Refill  . acetaminophen (TYLENOL) 500 MG tablet Take 500 mg by mouth every 6 (six) hours as needed for moderate pain.    [name] amLODipine (NORVASC) 5 MG tablet Take 1 tablet (5 mg total) by mouth 2 (two) times daily with breakfast and lunch. (Patient taking differently: Take 5 mg by mouth 2 (two) times daily. ) 180 tablet 3  . apixaban (ELIQUIS) 2.5 MG TABS tablet Take 1 tablet (2.5 mg total) by mouth 2 (two) times daily. 60 tablet 1  . atenolol  (TENORMIN) 100 MG tablet Take 1 tablet (100 mg total) by mouth daily. 30 tablet 0  . Calcium Carb-Cholecalciferol (CALCIUM/VITAMIN D PO) Take 1 tablet by mouth 2 (two) times daily.    . hydrALAZINE (APRESOLINE) 100 MG tablet Take 1 tablet (100 mg total) by mouth 3 (three) times daily. 30 tablet 0  . ibandronate (BONIVA) 150 MG tablet Take 1 tablet (150 mg total) by mouth every 30 (thirty) days. Take in the morning with a full glass of water, on an empty stomach, and do not take anything else by mouth or lie down for the next 30 min. 3 tablet 3  . Magnesium Gluconate (MAGNESIUM 27) 500 (27 Mg) MG TABS Take 500 mg by mouth 4 (four) times a week.     . Multiple Vitamin (MULTIVITAMIN) tablet Take 1 tablet by mouth 3 (three) times a week.     . ondansetron (ZOFRAN) 4 MG tablet Take 0.5-1 tablets (2-4 mg total) by mouth every 8 (eight) hours as needed for nausea or vomiting. 30 tablet 3  . oxyCODONE-acetaminophen (PERCOCET/ROXICET) 5-325 MG tablet Take 1-2 tablets by mouth every 6 (six) hours as needed for severe pain. 60 tablet 0  . pravastatin (PRAVACHOL) 40 MG tablet Take 40 mg by mouth every evening.    . vitamin C (ASCORBIC ACID) 500 MG tablet Take 500 mg by mouth daily.    . Vitamin D, Ergocalciferol, (DRISDOL) 50000 units CAPS capsule 1 cap po q week x 12 weeks 12 capsule 0  . furosemide (LASIX) 80 MG tablet Take 1 tablet (80 mg total) by mouth daily.     No facility-administered medications prior to visit.     Allergies  Allergen Reactions  . Clonidine Derivatives Palpitations and Other (See Comments)    Very sedated  . Codeine Nausea Only  . Nickel Rash  . Sulfa Antibiotics Other (See Comments)    Reaction unknown    ROS As per HPI  PE: Blood pressure 136/72, pulse 61, temperature 97.5 F (36.4 C), temperature source Oral, resp. rate 16, height 5' (1.524 m), weight 122 lb 4 oz (55.5 kg), SpO2 95 %.BP repeat manually: 136/72. Gen: Alert, well appearing.  Patient is oriented to  person, place, time, and situation. AFFECT: pleasant, lucid thought and speech. CV: RRR, no m/r/g.   LUNGS: CTA bilat, nonlabored resps, good aeration in all lung fields. EXT: no clubbing or cyanosis.  1+ pitting edema in both LLs from mid tibia level down into feet. BACK: kyphotic deformity noted.  TTP focally at T7-8 region in midline.   LABS:  Lab Results  Component Value Date   TSH 2.078 08/06/2016   Lab Results  Component Value Date   WBC 9.3 09/01/2016   HGB 13.1 09/01/2016   HCT 38 09/01/2016   MCV  94.0 08/12/2016   PLT 318 09/01/2016   Lab Results  Component Value Date   CREATININE 1.8 (A) 09/01/2016   BUN 51 (A) 09/01/2016   NA 137 09/01/2016   K 3.4 09/01/2016   CL 95 (L) 08/12/2016   CO2 34 (H) 08/12/2016   GFR 09/01/16= 25 ml/min (baseline sCr since approx 06/2016 has been about 2.5). sCr prior to 06/2016 was 1.01-1.63 (CRI stage II).  Lab Results  Component Value Date   ALT 17 09/01/2016   AST 28 09/01/2016   ALKPHOS 64 09/01/2016   BILITOT 0.6 08/06/2016   Lab Results  Component Value Date   CHOL 140 06/17/2016   Lab Results  Component Value Date   HDL 39 (L) 06/17/2016   Lab Results  Component Value Date   LDLCALC 72 06/17/2016   Lab Results  Component Value Date   TRIG 146 06/17/2016   Lab Results  Component Value Date   CHOLHDL 3.6 06/17/2016   Lab Results  Component Value Date   HGBA1C 5.6 01/29/2016   IMPRESSION AND PLAN:  1) Thoracic compression fractures T7 and T8, acute (sustained approx 10 weeks ago)--ongoing back pain at site of fractures, gradually improving.  She is s/p balloon kyphoplasty T 8 on 07/24/16.  This really did not do anything to curb her acute pain. Referral to endocrinology made today in order to see if she is a candidate for calcitonin--she has ongoing pain and is limited in her ability to tolerate opioids.  Most recent T spine x-ray showed stable compression fx, and since her pain is gradually improving I will  hold off on further x-ray at this time.    2) Vit D deficiency: Continue vit D high dose replacement.  Due for recheck vit D in 2 mo.  3) Osteoporosis: continue boniva that she recently started.  Endo referral made to see if any additonal short term or long term treatment needed.  Continue calcium and vit D.  4) CRI; most recent f/u with Dr. Florene Glen was 1 week ago, at which time her renal function was stable in stage 4 CRI range. Stable since 80mg  lasix qd has been started. Pt's LE edema much improved.  Will continue lasix 80mg  qd.  Potassium ok on recent labs.  5) HTN; stable.  No change in meds today.  An After Visit Summary was printed and given to the patient.  FOLLOW UP: Return in about 2 months (around 11/08/2016) for f/u HTN and vertebral comp fx.  Signed:  Crissie Sickles, MD           09/08/2016

## 2016-09-10 ENCOUNTER — Telehealth: Payer: Self-pay | Admitting: Cardiovascular Disease

## 2016-09-10 NOTE — Telephone Encounter (Signed)
Received records from [facility] for appointment on 09/23/16 with Dr Gwenlyn Found.  Records put with Dr Kennon Holter schedule for 09/23/16. lp

## 2016-09-15 ENCOUNTER — Encounter (INDEPENDENT_AMBULATORY_CARE_PROVIDER_SITE_OTHER): Payer: Self-pay

## 2016-09-15 ENCOUNTER — Encounter: Payer: Self-pay | Admitting: Neurology

## 2016-09-15 ENCOUNTER — Ambulatory Visit (INDEPENDENT_AMBULATORY_CARE_PROVIDER_SITE_OTHER): Payer: Medicare Other | Admitting: Neurology

## 2016-09-15 VITALS — BP 138/68 | HR 58 | Ht 60.0 in | Wt 121.4 lb

## 2016-09-15 DIAGNOSIS — I10 Essential (primary) hypertension: Secondary | ICD-10-CM | POA: Diagnosis not present

## 2016-09-15 DIAGNOSIS — S22000G Wedge compression fracture of unspecified thoracic vertebra, subsequent encounter for fracture with delayed healing: Secondary | ICD-10-CM

## 2016-09-15 DIAGNOSIS — H34232 Retinal artery branch occlusion, left eye: Secondary | ICD-10-CM

## 2016-09-15 DIAGNOSIS — G8929 Other chronic pain: Secondary | ICD-10-CM | POA: Diagnosis not present

## 2016-09-15 DIAGNOSIS — M546 Pain in thoracic spine: Secondary | ICD-10-CM

## 2016-09-15 DIAGNOSIS — I480 Paroxysmal atrial fibrillation: Secondary | ICD-10-CM

## 2016-09-15 DIAGNOSIS — Z8673 Personal history of transient ischemic attack (TIA), and cerebral infarction without residual deficits: Secondary | ICD-10-CM | POA: Insufficient documentation

## 2016-09-15 DIAGNOSIS — I639 Cerebral infarction, unspecified: Secondary | ICD-10-CM

## 2016-09-15 NOTE — Progress Notes (Addendum)
STROKE NEUROLOGY FOLLOW UP NOTE  NAME: Valerie Terrell DOB: [birth year]  REASON FOR VISIT: stroke follow up HISTORY FROM: pt and chart  Today we had the pleasure of seeing Valerie Terrell in follow-up at our Neurology Clinic. Pt was accompanied by no one.   History Summary       [name] is a 83 y.o. female with history of A. fib not on anticoagulation, HTN, HLD admitted on 01/28/16 for left visual deficit diagnosed with left branch retinal artery occlusion with left eye upper half visual field deficit. MRI showed scattered subacute punctate infarcts. CTA head and neck unremarkable except aorta athero. Renal US showed left kidney atrophy which was known in the past. EF 55-60%. LDL 97 and A1C 5.6. She had a syncope event after initiation of Xarelto, suffered traumatic SAH, so Xarelto discontinued. She was started on eliquis this time, and continued on pravastatin. She also has chest pain followed by cardiology, so ASA 81mg  was also continued.  She was discharged with ASA, eliquis and pravastatin.   Follow up 03/04/16 - the patient has been doing well.  She presented to ER on 02/02/16 for worsening left eye visual field deficit. However, ER exam did not show significant change from recent discharge. Had MRI again still showing scattered punctate subacute infarcts. Discharged from ER without treatment change. Followed with cardiology on 02/12/16 and did not feel ASA necessary. So ASA was discontinued. She was admitted on 02/27/16 for chest pain and ruled out for ACS and continued on eliquis and pravastatin on discharge. Had renal artery US showed right RA 1-59% stenosis and left RA absent signal which is consistent with previous findings. Her BP still fluctuate and currently on 5 BP meds. She has appointment with Dr. Gwenlyn Found next Friday. She continues to have left eye upper visual field deficit and she did have visual hallucinations with seeing cats/dogs running on the floor but she knew that was not  real. She has appointment with Dr. Rodena Piety next Wednesday. BP today on clinic 183/71 but at home 130-160.   Interval History During the interval time, pt has been doing well from stroke standpoint. No recurrent stroke like symptoms. She is on eliquis 2.5mg  and pravastatin without side effect. Left eye upper visual field much improved and now can see shadows. No more visual hallucination.   However, she developed acute thoracic and lumbar spinal pain secondary to osteoporotic vertebral compression fractures. She had IR with Dr. Estanislado Pandy at T8 with kyphoplasty. Felt much better with thoracic pain but continued to have lumbar pain. Her PCP started her on percocet and it helped but pt developed side effects of sleepiness and drowsiness. So she limited her self to half tablet bid, but her pain not adequately controlled. She came in today for LBP.    REVIEW OF SYSTEMS: Full 14 system review of systems performed and notable only for those listed below and in HPI above, all others are negative:  Constitutional:  Activity change Cardiovascular:  Ear/Nose/Throat:   Skin:  Eyes:   Respiratory:   Gastroitestinal:   Genitourinary:  Hematology/Lymphatic:   Endocrine:  Musculoskeletal:   Allergy/Immunology:   Neurological:  HA Psychiatric:  Sleep:   The following represents the patient's updated allergies and side effects list: Allergies  Allergen Reactions  . Clonidine Derivatives Palpitations and Other (See Comments)    Very sedated  . Codeine Nausea Only  . Nickel Rash  . Sulfa Antibiotics Other (See Comments)    Reaction unknown  The neurologically relevant items on the patient's problem list were reviewed on today's visit.  Neurologic Examination  A problem focused neurological exam (12 or more points of the single system neurologic examination, vital signs counts as 1 point, cranial nerves count for 8 points) was performed.  Blood pressure 138/68, pulse (!) 58, height 5' (1.524 m),  weight 121 lb 6.4 oz (55.1 kg).  General - Well nourished, well developed, in no apparent distress.  Ophthalmologic - Fundi not visualized due to small pupils.  Cardiovascular - Regular rate and rhythm with no murmur.  skeletomuscular - right paraspinal tenderness on palpation at upper lubmar spine  Mental Status -  Level of arousal and orientation to time, place, and person were intact. Language including expression, naming, repetition, comprehension was assessed and found intact. Fund of Knowledge was assessed and was intact.  Cranial Nerves II - XII - II - Visual field intact OD, left eye upper field can see FC. III, IV, VI - Extraocular movements intact. V - Facial sensation intact bilaterally. VII - Facial movement intact bilaterally. VIII - Hearing & vestibular intact bilaterally. X - Palate elevates symmetrically. XI - Chin turning & shoulder shrug intact bilaterally. XII - Tongue protrusion intact.  Motor Strength - The patient's strength was normal in all extremities and pronator drift was absent.  Bulk was normal and fasciculations were absent.   Motor Tone - Muscle tone was assessed at the neck and appendages and was normal.  Reflexes - The patient's reflexes were 1+ in all extremities and she had no pathological reflexes.  Sensory - Light touch, temperature/pinprick were assessed and were normal.    Coordination - The patient had normal movements in the hands and feet with no ataxia or dysmetria.  Tremor was absent.  Gait and Station - The patient's transfers, posture, gait, station, and turns were observed as normal.   Data reviewed: I personally reviewed the images and agree with the radiology interpretations.  Ct Head Wo Contrast 01/28/2016 IMPRESSION: 1. No acute intracranial abnormalities. 2. Chronic microvascular disease and brain atrophy.   Dg Chest Port 1 View 01/28/2016 IMPRESSION: Borderline cardiomegaly with atherosclerosis of the thoracic aorta.  Mild bibasilar atelectasis.  CTA Head and Neck  01/29/2016 Given the micro embolic infarctions in both cerebral hemispheres, the most significant finding in this case is that of atherosclerosis of the aorta. There is irregular plaque, particularly notable in the descending aorta. The MRI pattern suggest micro embolic disease from the heart or ascending aorta. Atherosclerotic disease at both carotid bifurcations. 20% narrowing on the right and no narrowing on the left. Atherosclerotic disease in both carotid siphon regions but without stenosis greater than 30%.  MRI brain 01/29/2016 Scattered punctate acute infarctions consistent with micro embolic infarctions from the heart or ascending aorta. These are evident in the right occipital lobe and the left parietal vertex. Likely subacute events.  Renal US 01/29/16 1. Atrophic/hypoplastic left kidney. 2. Small right renal cyst. 3. No hydronephrosis.  2D Echocardiogram   - Left ventricle: The cavity size was normal. Wall thickness was normal. Systolic function was normal. The estimated ejection fraction was in the range of 55% to 60%. Wall motion was normal; there were no regional wall motion abnormalities. Doppler parameters are consistent with abnormal left ventricular relaxation (grade 1 diastolic dysfunction). - Mitral valve: Calcified annulus. Impressions: - No cardiac source of emboli was indentified.  EKG  junctional rhythm  Renal artery [place] 02/26/16 - right RA 1-59% stenosis - left RA absence  of signal.   IR thoracic spine Status post vertebral body augmentation using balloon kyphoplasty at T8 as described without event.  MRI lumbar spine 07/16/16 Negative for fracture or other acute abnormality. Mild degenerative disease as described above.  MRI thoracic spine 07/23/16 1. 50% moderate compression deformity of the T8 vertebral body with edema indicating recent injury. No significant retropulsion. 2. No evidence of  discitis, additional acute fracture, or suspicious osseous lesion. 3. Central protrusion at the T7-8 level with anterior cord impingement and mild flattening. 4. At the T7-8 9 level on the right there is a 7 mm T2 hyperintense structure which is centered just anterior to the right-sided T8-9 facet in the extradural space, probably a synovial cyst. The cyst likely impinges on the exiting T8 nerve root. 5. Mild bilateral degenerative facet edema at T8-9 and on the right at T6-7.   Component     Latest Ref Rng & Units 01/29/2016  Cholesterol     0 - 200 mg/dL 174  Triglycerides     <150 mg/dL 180 (H)  HDL Cholesterol     >40 mg/dL 41  Total CHOL/HDL Ratio     RATIO 4.2  VLDL     0 - 40 mg/dL 36  LDL (calc)     0 - 99 mg/dL 97  Hemoglobin A1C     4.8 - 5.6 % 5.6  Mean Plasma Glucose     mg/dL 114    Assessment: As you may recall, she is a 83 y.o. Caucasian female with PMH of A. fib not on anticoagulation, HTN, HLD admitted on 01/28/16 for left branch retinal artery occlusion with left eye upper half visual field deficit. MRI showed scattered subacute punctate infarcts. CTA head and neck unremarkable except aorta athero. Renal US showed left kidney atrophy which was known in the past. EF 55-60%. LDL 97 and A1C 5.6. She had a syncope event after initiation of Xarelto, suffered traumatic SAH, so Xarelto discontinued. She also has chest pain followed by cardiology, so ASA 81mg  was continued.  She was discharged with ASA, eliquis and pravastatin. During the interval time, she followed with cardiology on 02/12/16 and did not feel ASA necessary. So ASA was discontinued. She was admitted on 02/27/16 for chest pain and ruled out for ACS and continued on eliquis and pravastatin on discharge. Had renal artery US showed right RA 1-59% stenosis and left RA absent signal which is consistent with previous findings. She continues to have left eye upper visual field deficit and she did have visual  hallucinations with seeing cats/dogs running on the floor but she knew that was not real. 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 She followed with Dr. Rodena Piety and visual field deficit improved and visual hallucination resolved.  However, pt developed acute thoracic and lumbar spinal pain secondary to osteoporotic vertebral compression fractures. She had IR with Dr. Estanislado Pandy at T8 with kyphoplasty. Percocet helps but has side effects of drowsiness.   Plan:  - continue eliquis and pravastatin for stroke prevention. - check BP at home and record - follow up with cardiology Dr. Gwenlyn Found for BP and afib - consider to use percocet 4 times a day to help the pain - will refer to pain management to consider injection - will arrange for follow up with Dr. Estanislado Pandy  - pt wish to hold off PT/OT at this time. - Follow up with your primary care physician for stroke risk factor modification. Recommend maintain blood pressure goal <140/80, diabetes with hemoglobin A1c goal below  7.0% and lipids with LDL cholesterol goal below 70 mg/dL.   - follow up in 3 months.   I spent more than 25 minutes of face to face time with the patient. Greater than 50% of time was spent in counseling and coordination of care. We discussed pain medication, refer to pain management and follow up with Dr. Estanislado Pandy.    Orders Placed This Encounter  Procedures  . Ambulatory referral to Pain Clinic    Referral Priority:   Urgent    Referral Type:   Consultation    Referral Reason:   Specialty Services Required    Requested Specialty:   Pain Medicine    Number of Visits Requested:   1    No orders of the defined types were placed in this encounter.   Patient Instructions  - continue eliquis and pravastatin for stroke prevention. - check BP at home and record - follow up with cardiology Dr. Gwenlyn Found for BP and afib - consider to use percocet 4 times a day to see if your pain getting better and side effects getting away - will  refer to pain management  - will arrange for follow up with Dr. Estanislado Pandy to see if any intervention needed.   - Follow up with your primary care physician for stroke risk factor modification. Recommend maintain blood pressure goal <140/80, diabetes with hemoglobin A1c goal below 7.0% and lipids with LDL cholesterol goal below 70 mg/dL.   - follow up in 3 months.    Rosalin Hawking, MD PhD [facility] [address] [place], [place] [address] [phone]

## 2016-09-15 NOTE — Patient Instructions (Addendum)
-   continue eliquis and pravastatin for stroke prevention. - check BP at home and record - follow up with cardiology Dr. Gwenlyn Found for BP and afib - consider to use percocet 4 times a day to see if your pain getting better and side effects getting away - will refer to pain management  - will arrange for follow up with Dr. Estanislado Pandy to see if any intervention needed.   - Follow up with your primary care physician for stroke risk factor modification. Recommend maintain blood pressure goal <140/80, diabetes with hemoglobin A1c goal below 7.0% and lipids with LDL cholesterol goal below 70 mg/dL.   - follow up in 3 months.

## 2016-09-16 ENCOUNTER — Telehealth (HOSPITAL_COMMUNITY): Payer: Self-pay

## 2016-09-16 ENCOUNTER — Encounter: Payer: Self-pay | Admitting: Family Medicine

## 2016-09-16 NOTE — Telephone Encounter (Signed)
Called to schedule f/u, left message for pt to return call. AW 

## 2016-09-16 NOTE — Addendum Note (Signed)
Addended by: Rosalin Hawking on: 09/16/2016 08:12 AM   Modules accepted: Orders

## 2016-09-22 ENCOUNTER — Other Ambulatory Visit: Payer: Self-pay | Admitting: Family Medicine

## 2016-09-22 MED ORDER — AMLODIPINE BESYLATE 5 MG PO TABS: 5.0000 mg | ORAL_TABLET | Freq: Two times a day (BID) | ORAL | 0 refills | Status: DC

## 2016-09-22 NOTE — Telephone Encounter (Signed)
7 day supply send to pharmacy per patient request.

## 2016-09-22 NOTE — Telephone Encounter (Signed)
Patient states she will not get her shipment of medication from mail order pharmacy until Saturday.  She is requesting a 1 week refill of amLODipine (NORVASC) 5 MG tablet to hold her over until her she receives her medication in the mail.  Pharmacy: [id number] - [place], [place] [phone] (Phone) [phone] (Fax)

## 2016-09-23 ENCOUNTER — Encounter: Payer: Self-pay | Admitting: Cardiovascular Disease

## 2016-09-23 ENCOUNTER — Ambulatory Visit (INDEPENDENT_AMBULATORY_CARE_PROVIDER_SITE_OTHER): Payer: Medicare Other | Admitting: Cardiovascular Disease

## 2016-09-23 ENCOUNTER — Telehealth (HOSPITAL_COMMUNITY): Payer: Self-pay

## 2016-09-23 VITALS — BP 161/72 | HR 60 | Ht 59.0 in | Wt 111.4 lb

## 2016-09-23 DIAGNOSIS — I480 Paroxysmal atrial fibrillation: Secondary | ICD-10-CM

## 2016-09-23 DIAGNOSIS — Z905 Acquired absence of kidney: Secondary | ICD-10-CM | POA: Diagnosis not present

## 2016-09-23 DIAGNOSIS — I10 Essential (primary) hypertension: Secondary | ICD-10-CM

## 2016-09-23 DIAGNOSIS — E785 Hyperlipidemia, unspecified: Secondary | ICD-10-CM | POA: Diagnosis not present

## 2016-09-23 NOTE — Assessment & Plan Note (Signed)
History of a nonfunctioning left kidney with mild right renal artery stenosis demonstrated by duplex ultrasound October 2017.

## 2016-09-23 NOTE — Progress Notes (Signed)
09/23/2016 Valerie Terrell   1933/02/07  [id number]  Primary Physician McGowen, Adrian Blackwater, MD Primary Cardiologist: Lorretta Harp MD Renae Gloss  HPI:  [name] is a pleasant 83 year old widowed Caucasian female mother of one daughter Valerie Terrell  who I last saw in the office 06/24/16.  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 She smoked remotely and drinks socially. She does have a history of difficult to control hypertension. Hyperlipidemia. She had a 2-D echo which was normal, a Myoview stress test which was normal and a CT angiogram performed 01/29/16 that showed no evidence of osseous stenosis. She does have difficult to control hypertension with a recent renal Doppler study performed 02/26/16 that showed an atrophic nonfunctioning left kidney and a right kidney of measuring 11 cm with at least a moderate right renal artery stenosis. Her serum creatinine in September was 1.15 and currently isn't elicited is in the 1.6 range potentially related to hay her ARB. Her ARB was stopped her serum creatinine fell to 1.4. She saw Dr. Erling Cruz, nephrology, who increased her hydralazine and clonidine. She was intolerant to her higher hydralazine dose and seems to be intolerant to the clonidine as well. She has moderate bilateral lower extremity edema possibly related to her amlodipine. Since I saw her she has been hospitalized with acute on chronic renal insufficiency. Her ace and/or arm were discontinued. Her serum creatinine rose to 4 range and is settled back down to 1.8. Her Lasix was increased because of lower extremity edema. Her Eliquis  was decreased from 5-2.5 mg by mouth twice a day. Her major complaint is back and side pain.   Current Outpatient Prescriptions  Medication Sig Dispense Refill  . acetaminophen (TYLENOL) 500 MG tablet Take  500 mg by mouth every 6 (six) hours as needed for moderate pain.    [name] amLODipine (NORVASC) 5 MG tablet Take 1 tablet (5 mg total) by mouth 2 (two) times daily with breakfast and lunch. 14 tablet 0  . apixaban (ELIQUIS) 2.5 MG TABS tablet Take 1 tablet (2.5 mg total) by mouth 2 (two) times daily. 60 tablet 1  . atenolol (TENORMIN) 100 MG tablet Take 1 tablet (100 mg total) by mouth daily. 30 tablet 0  . Calcium Carb-Cholecalciferol (CALCIUM/VITAMIN D PO) Take 1 tablet by mouth 2 (two) times daily.    . furosemide (LASIX) 80 MG tablet Take 1 tablet (80 mg total) by mouth daily. 90 tablet 1  . hydrALAZINE (APRESOLINE) 100 MG tablet Take 1 tablet (100 mg total) by mouth 3 (three) times daily. 30 tablet 0  . ibandronate (BONIVA) 150 MG tablet Take 1 tablet (150 mg total) by mouth every 30 (thirty) days. Take in the morning with a full glass of water, on an empty stomach, and do not take anything else by mouth or lie down for the next 30 min. 3 tablet 3  . Magnesium Gluconate (MAGNESIUM 27) 500 (27 Mg) MG TABS Take 500 mg by mouth 4 (four) times a week.     . Multiple Vitamin (MULTIVITAMIN) tablet Take 1 tablet by mouth 3 (three) times a week.     . ondansetron (ZOFRAN) 4 MG tablet Take 0.5-1 tablets (2-4 mg total) by mouth every 8 (eight) hours as needed for nausea or vomiting. 30 tablet 3  . oxyCODONE-acetaminophen (PERCOCET/ROXICET) 5-325 MG  tablet Take 1-2 tablets by mouth every 6 (six) hours as needed for severe pain. 60 tablet 0  . pravastatin (PRAVACHOL) 40 MG tablet Take 40 mg by mouth every evening.    . vitamin C (ASCORBIC ACID) 500 MG tablet Take 500 mg by mouth daily.    . Vitamin D, Ergocalciferol, (DRISDOL) 50000 units CAPS capsule 1 cap po q week x 12 weeks 12 capsule 0   No current facility-administered medications for this visit.     Allergies  Allergen Reactions  . Clonidine Derivatives Palpitations and Other (See Comments)    Very sedated  . Codeine Nausea Only  . Nickel Rash  .  Sulfa Antibiotics Other (See Comments)    Reaction unknown    Social History   Social History  . Marital status: Widowed    Spouse name: N/A  . Number of children: N/A  . Years of education: N/A   Occupational History  . Not on file.   Social History Main Topics  . Smoking status: Never Smoker  . Smokeless tobacco: Never Used  . Alcohol use 0.6 oz/week    1 Glasses of wine per week     Comment: wine  . Drug use: No  . Sexual activity: No   Other Topics Concern  . Not on file   Social History Narrative   Widow.  One daughter, lives with her.   Educ: college   Occup: retired Marine scientist.   No T/A/Ds.   She is almost a vegetarian.     Review of Systems: General: negative for chills, fever, night sweats or weight changes.  Cardiovascular: negative for chest pain, dyspnea on exertion, edema, orthopnea, palpitations, paroxysmal nocturnal dyspnea or shortness of breath Dermatological: negative for rash Respiratory: negative for cough or wheezing Urologic: negative for hematuria Abdominal: negative for nausea, vomiting, diarrhea, bright red blood per rectum, melena, or hematemesis Neurologic: negative for visual changes, syncope, or dizziness All other systems reviewed and are otherwise negative except as noted above.    Blood pressure (!) 161/72, pulse 60, height 4\' 11"  (1.499 m), weight 111 lb 6.4 oz (50.5 kg), SpO2 94 %.  General appearance: alert and no distress Neck: no adenopathy, no carotid bruit, no JVD, supple, symmetrical, trachea midline and thyroid not enlarged, symmetric, no tenderness/mass/nodules Lungs: clear to auscultation bilaterally Heart: irregularly irregular rhythm Extremities: 2-3+ pitting edema bilaterally  EKG not performed today  ASSESSMENT AND PLAN:   PAF (paroxysmal atrial fibrillation) (HCC) History of paroxysmal fibrillation on low-dose Eliquis oral anticoagulation and atenolol for rate control.  Essential hypertension History of  hypertension blood pressure measured at 161/72. Blood pressure is usually better controlled than this. Her systolic was in the 321 range systolic morning when she measured at if she is on amlodipine, atenolol and hydralazine. Continue current meds at current dosing  Dyslipidemia History of dyslipidemia not on statin therapy with recent lipid profile performed 06/17/16 revealing LDL 72 and HDL of 39.  Single kidney History of a nonfunctioning left kidney with mild right renal artery stenosis demonstrated by duplex ultrasound October 2017.      Lorretta Harp MD FACP,FACC,FAHA, [facility] 09/23/2016 11:39 AM

## 2016-09-23 NOTE — Assessment & Plan Note (Signed)
History of paroxysmal fibrillation on low-dose Eliquis oral anticoagulation and atenolol for rate control.

## 2016-09-23 NOTE — Assessment & Plan Note (Signed)
History of hypertension blood pressure measured at 161/72. Blood pressure is usually better controlled than this. Her systolic was in the 948 range systolic morning when she measured at if she is on amlodipine, atenolol and hydralazine. Continue current meds at current dosing

## 2016-09-23 NOTE — Assessment & Plan Note (Signed)
History of dyslipidemia not on statin therapy with recent lipid profile performed 06/17/16 revealing LDL 72 and HDL of 39.

## 2016-09-23 NOTE — Patient Instructions (Signed)
Medication Instructions: Your physician recommends that you continue on your current medications as directed. Please refer to the Current Medication list given to you today.  Follow-Up: Your physician wants you to follow-up in: 6 months with Dr. Berry. You will receive a reminder letter in the mail two months in advance. If you don't receive a letter, please call our office to schedule the follow-up appointment.  If you need a refill on your cardiac medications before your next appointment, please call your pharmacy.  

## 2016-09-23 NOTE — Telephone Encounter (Signed)
Called to schedule f/u. Pt stated that she was still having back pain but in a different location. She is also having abdominal pain. She wants to speak with her PCP and see what he wants her to deal with first and she will give me a call back if she decides to schedule. AW

## 2016-09-24 ENCOUNTER — Ambulatory Visit (INDEPENDENT_AMBULATORY_CARE_PROVIDER_SITE_OTHER): Payer: Medicare Other | Admitting: Family Medicine

## 2016-09-24 ENCOUNTER — Encounter: Payer: Self-pay | Admitting: Family Medicine

## 2016-09-24 VITALS — BP 123/68 | HR 65 | Temp 98.6°F | Resp 16 | Ht 59.0 in | Wt 120.5 lb

## 2016-09-24 DIAGNOSIS — R1033 Periumbilical pain: Secondary | ICD-10-CM | POA: Diagnosis not present

## 2016-09-24 DIAGNOSIS — N183 Chronic kidney disease, stage 3 unspecified: Secondary | ICD-10-CM

## 2016-09-24 DIAGNOSIS — Z7901 Long term (current) use of anticoagulants: Secondary | ICD-10-CM | POA: Diagnosis not present

## 2016-09-24 DIAGNOSIS — R109 Unspecified abdominal pain: Secondary | ICD-10-CM | POA: Diagnosis not present

## 2016-09-24 DIAGNOSIS — R3915 Urgency of urination: Secondary | ICD-10-CM

## 2016-09-24 DIAGNOSIS — I480 Paroxysmal atrial fibrillation: Secondary | ICD-10-CM | POA: Diagnosis not present

## 2016-09-24 LAB — POCT URINALYSIS DIPSTICK
Bilirubin, UA: NEGATIVE
Blood, UA: NEGATIVE
GLUCOSE UA: NEGATIVE
Ketones, UA: NEGATIVE
NITRITE UA: NEGATIVE
PROTEIN UA: 30
SPEC GRAV UA: 1.01 (ref 1.010–1.025)
UROBILINOGEN UA: 0.2 U/dL
pH, UA: 5.5 (ref 5.0–8.0)

## 2016-09-24 LAB — COMPREHENSIVE METABOLIC PANEL
ALBUMIN: 3.8 g/dL (ref 3.5–5.2)
ALK PHOS: 55 U/L (ref 39–117)
ALT: 16 U/L (ref 0–35)
AST: 26 U/L (ref 0–37)
BILIRUBIN TOTAL: 0.3 mg/dL (ref 0.2–1.2)
BUN: 54 mg/dL — AB (ref 6–23)
CO2: 31 mEq/L (ref 19–32)
CREATININE: 2.04 mg/dL — AB (ref 0.40–1.20)
Calcium: 9 mg/dL (ref 8.4–10.5)
Chloride: 100 mEq/L (ref 96–112)
GFR: 24.81 mL/min — ABNORMAL LOW (ref >60.00)
GLUCOSE: 115 mg/dL — AB (ref 70–99)
Potassium: 3.7 mEq/L (ref 3.5–5.1)
Sodium: 139 mEq/L (ref 135–145)
TOTAL PROTEIN: 6.3 g/dL (ref 6.0–8.3)

## 2016-09-24 LAB — CBC WITH DIFFERENTIAL/PLATELET
BASOS ABS: 0 10*3/uL (ref 0.0–0.1)
Basophils Relative: 0.4 % (ref 0.0–3.0)
EOS ABS: 0.1 10*3/uL (ref 0.0–0.7)
Eosinophils Relative: 1.2 % (ref 0.0–5.0)
HCT: 41.1 % (ref 36.0–46.0)
HEMOGLOBIN: 13.6 g/dL (ref 12.0–15.0)
LYMPHS PCT: 10 % — AB (ref 12.0–46.0)
Lymphs Abs: 0.9 10*3/uL (ref 0.7–4.0)
MCHC: 33.2 g/dL (ref 30.0–36.0)
MCV: 93.4 fl (ref 78.0–100.0)
MONO ABS: 1.3 10*3/uL — AB (ref 0.1–1.0)
Monocytes Relative: 14.4 % — ABNORMAL HIGH (ref 3.0–12.0)
Neutro Abs: 6.5 10*3/uL (ref 1.4–7.7)
Neutrophils Relative %: 74 % (ref 43.0–77.0)
Platelets: 325 10*3/uL (ref 150.0–400.0)
RBC: 4.4 Mil/uL (ref 3.87–5.11)
RDW: 14.6 % (ref 11.5–15.5)
WBC: 8.7 10*3/uL (ref 4.0–10.5)

## 2016-09-24 NOTE — Progress Notes (Signed)
OFFICE VISIT  09/24/2016   CC:  Chief Complaint  Patient presents with  . Abdominal Pain    RLQ x 10 day   HPI:    Patient is a 83 y.o. Caucasian female who presents accompanied by her daughter for abdominal pain. Onset about 10 d/a, describes pain/fullness in lower R abdominal area initiated by bending forward, starts to have pain in RLQ, extends around R side.  No n/v.  No diarrhea or recent constipation. BMs: daily to twice daily--soft and easy to pass. No fevers.  No recent strain recalled, no fall.  Notes no blood in stool or urine. Has some urinary urgency lately which is worse than her baseline.  No frequency or dysuria.   Eating makes this no different.  Percocet and tylenol help.  Using 1/2 tab percocet bid, says pain in her back associated with relatively recent T8 compression fracture is improved but still significant.   Past Medical History:  Diagnosis Date  . Atrophy of left kidney    with absent blood flow by renal artery dopplers (Dr. Gwenlyn Found)  . 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 Chronic renal insufficiency, stage 3 (moderate)    Baseline GFR 25-30 ml/min-- renal u/s showed atrophic/hypoplastic left kidney.  No hydronephrosis.  Small right renal cyst.  . History of adenomatous polyp of colon   . History of subarachnoid hemorrhage 10/2014   after syncope and while on xarelto  . Hyperlipidemia   . Hypertension    Difficult to control, in the setting of one functioning kidney: pt was referred to nephrology by Dr. Gwenlyn Found 06/2015.  . Lumbar radiculopathy 2012  . Lumbar spondylosis    MR 07/2016---no sign of spinal nerve compression or cord compression.  Pt set up with outpt ortho while admitted to hosp 07/2016.  . Metatarsal fracture 06/10/2016   Nondisplaced, left 5th metatarsal--pt was referred to ortho  . Osteopenia 2014   T-score -2.1  . PAF (paroxysmal atrial fibrillation) (HCC)    Eliquis started after BRAO and CVA  . Stroke [facility])     cardioembolic (had CVA while on no anticoag)--"scattered subacute punctate infarcts: 1 in R parietal lobe and 2 in occipital cortex" on MRI br.  CT angio head/neck: aortic arch athero.   . Thoracic compression fracture ([place]) 07/2016   T7 and T8-- T8 kyphoplasty during hosp admission 07/2016.  Neuro referred pt to pain mgmt for consideration of injection 09/2016.  I referred her to endo 08/2016 for consideration of calcitonin treatment.    Past Surgical History:  Procedure Laterality Date  . APPENDECTOMY  child  . CARDIOVASCULAR STRESS TEST  01/31/2016   Stress myoview: NORMAL/Low risk.  EF 56%.  . [place]  . COLONOSCOPY  2015   + hx of adenomatous polyps.  Need digest health spec in [place] records to see when pt due for next colonoscopy  . IR GENERIC HISTORICAL  07/24/2016   IR KYPHO THORACIC WITH BONE BIOPSY 07/24/2016 Luanne Bras, MD [facility]  . KYPHOPLASTY  07/27/2016   T8  . Renal artery dopplers  02/26/2016   Her right renal dimension was 11 cm pole to pole with mild to moderate right renal artery stenosis. A right renal aortic ratio was 3.22 suggesting less than a 50% stenosis.  . TONSILLECTOMY    . TRANSTHORACIC ECHOCARDIOGRAM  01/29/2016   EF 55-60%, normal LV wall motion, grade I DD.  No cardiac source of emboli was seen.    Outpatient Medications  Prior to Visit  Medication Sig Dispense Refill  . acetaminophen (TYLENOL) 500 MG tablet Take 500 mg by mouth every 6 (six) hours as needed for moderate pain.    [name] amLODipine (NORVASC) 5 MG tablet Take 1 tablet (5 mg total) by mouth 2 (two) times daily with breakfast and lunch. 14 tablet 0  . apixaban (ELIQUIS) 2.5 MG TABS tablet Take 1 tablet (2.5 mg total) by mouth 2 (two) times daily. 60 tablet 1  . atenolol (TENORMIN) 100 MG tablet Take 1 tablet (100 mg total) by mouth daily. 30 tablet 0  . Calcium Carb-Cholecalciferol (CALCIUM/VITAMIN D PO) Take 1 tablet by mouth 2 (two) times daily.    . furosemide (LASIX) 80  MG tablet Take 1 tablet (80 mg total) by mouth daily. 90 tablet 1  . hydrALAZINE (APRESOLINE) 100 MG tablet Take 1 tablet (100 mg total) by mouth 3 (three) times daily. 30 tablet 0  . ibandronate (BONIVA) 150 MG tablet Take 1 tablet (150 mg total) by mouth every 30 (thirty) days. Take in the morning with a full glass of water, on an empty stomach, and do not take anything else by mouth or lie down for the next 30 min. 3 tablet 3  . Magnesium Gluconate (MAGNESIUM 27) 500 (27 Mg) MG TABS Take 500 mg by mouth 4 (four) times a week.     . Multiple Vitamin (MULTIVITAMIN) tablet Take 1 tablet by mouth 3 (three) times a week.     . ondansetron (ZOFRAN) 4 MG tablet Take 0.5-1 tablets (2-4 mg total) by mouth every 8 (eight) hours as needed for nausea or vomiting. 30 tablet 3  . oxyCODONE-acetaminophen (PERCOCET/ROXICET) 5-325 MG tablet Take 1-2 tablets by mouth every 6 (six) hours as needed for severe pain. 60 tablet 0  . pravastatin (PRAVACHOL) 40 MG tablet Take 40 mg by mouth every evening.    . vitamin C (ASCORBIC ACID) 500 MG tablet Take 500 mg by mouth daily.    . Vitamin D, Ergocalciferol, (DRISDOL) 50000 units CAPS capsule 1 cap po q week x 12 weeks 12 capsule 0   No facility-administered medications prior to visit.     Allergies  Allergen Reactions  . Clonidine Derivatives Palpitations and Other (See Comments)    Very sedated  . Codeine Nausea Only  . Nickel Rash  . Sulfa Antibiotics Other (See Comments)    Reaction unknown    ROS As per HPI  PE: Blood pressure 123/68, pulse 65, temperature 98.6 F (37 C), temperature source Oral, resp. rate 16, height 4\' 11"  (1.499 m), weight 120 lb 8 oz (54.7 kg), SpO2 91 %. Gen: Alert, well appearing.  Patient is oriented to person, place, time, and situation. FOY:DXAJ: no injection, icteris, swelling, or exudate.  EOMI, PERRLA. Mouth: lips without lesion/swelling.  Oral mucosa pink and moist. Oropharynx without erythema, exudate, or swelling.   Neck - No masses or thyromegaly or limitation in range of motion CV: RRR, no m/r/g.   LUNGS: CTA bilat, nonlabored resps, good aeration in all lung fields. ABD: soft, non-distended except for mild rotund appearance to lower abdomen diffusely.  No bruit, no mass, no HSM. She has no abdominal tenderness today, but has some tenderness to palpation just inferior to R lower rib on R side.   No TTP over iliac crest area or into groin.  No bruising or rash noted.  LABS:    Chemistry      Component Value Date/Time   NA 137 09/01/2016  K 3.4 09/01/2016   CL 95 (L) 08/12/2016 1525   CO2 34 (H) 08/12/2016 1525   BUN 51 (A) 09/01/2016   CREATININE 1.8 (A) 09/01/2016   CREATININE 2.24 (H) 08/12/2016 1525   CREATININE 1.63 (H) 03/24/2016 0110   GLU 128 09/01/2016      Component Value Date/Time   CALCIUM 9.3 08/12/2016 1525   ALKPHOS 64 09/01/2016   AST 28 09/01/2016   ALT 17 09/01/2016   BILITOT 0.6 08/06/2016 0813   BILITOT 0.2 06/17/2016 0819     CC UA today: 30 protein, small leu, o/w normal.  IMPRESSION AND PLAN:  1) Periumbilical and R sided abdominal pain: no tenderness in her area of pain today. Some tenderness under R inferolateral ribs, fairly focal.   Will further evaluate with CMET, CBC, send urine for culture--no meds at this time based on minimally abnl urine dip+ not convincing UTI symptoms. Also check KUB today.  2) CRI stage 3.  She is on daily 80mg  lasix and has a hx of acute-on-chronic renal failure that required hospitalization in the past.  Will recheck BMET today and if Cr has bumped up then will change lasix to 80mg  qod dosing.  An After Visit Summary was printed and given to the patient.  FOLLOW UP: Return if symptoms worsen or fail to improve.  Signed:  Crissie Sickles, MD           09/24/2016

## 2016-09-25 LAB — URINE CULTURE: Organism ID, Bacteria: NO GROWTH

## 2016-09-28 ENCOUNTER — Telehealth: Payer: Self-pay | Admitting: Family Medicine

## 2016-09-28 ENCOUNTER — Other Ambulatory Visit: Payer: Self-pay | Admitting: *Deleted

## 2016-09-28 ENCOUNTER — Telehealth (HOSPITAL_COMMUNITY): Payer: Self-pay

## 2016-09-28 MED ORDER — ATENOLOL 100 MG PO TABS: 100.0000 mg | ORAL_TABLET | Freq: Every day | ORAL | 1 refills | Status: DC

## 2016-09-28 NOTE — Telephone Encounter (Signed)
Pt called to inform us that she if feeling better and does not need to come in for a f/u. She will call us back if things change. AW

## 2016-09-28 NOTE — Telephone Encounter (Signed)
[facility].  RF request for atenolol LOV: 09/24/16 Next ov: 11/04/16 Last written: 08/08/16 #30 w/ 1OA

## 2016-09-28 NOTE — Telephone Encounter (Signed)
I can't order an x-ray there. Needs to go to one of our imaging options.-thx

## 2016-09-28 NOTE — Telephone Encounter (Signed)
Will you please help pt decide where she would like to go for her xray? Thanks

## 2016-09-28 NOTE — Telephone Encounter (Signed)
Please advise. Thanks.  

## 2016-09-28 NOTE — Telephone Encounter (Signed)
SW patient. She is agreeable to go to one of the [facility].

## 2016-09-28 NOTE — Telephone Encounter (Signed)
Patient returning call to inquire where she need to go for xray.  Per Nira Conn, daughter requested order for [facility].  Patient is now requesting order to have x-ray done at [facility] in [place].

## 2016-09-29 DIAGNOSIS — R51 Headache: Secondary | ICD-10-CM | POA: Diagnosis not present

## 2016-09-29 DIAGNOSIS — H53452 Other localized visual field defect, left eye: Secondary | ICD-10-CM | POA: Diagnosis not present

## 2016-09-29 DIAGNOSIS — H34232 Retinal artery branch occlusion, left eye: Secondary | ICD-10-CM | POA: Diagnosis not present

## 2016-09-29 DIAGNOSIS — H5213 Myopia, bilateral: Secondary | ICD-10-CM | POA: Diagnosis not present

## 2016-09-29 DIAGNOSIS — H35033 Hypertensive retinopathy, bilateral: Secondary | ICD-10-CM | POA: Diagnosis not present

## 2016-09-30 ENCOUNTER — Ambulatory Visit (INDEPENDENT_AMBULATORY_CARE_PROVIDER_SITE_OTHER): Payer: Medicare Other

## 2016-09-30 DIAGNOSIS — R109 Unspecified abdominal pain: Secondary | ICD-10-CM | POA: Diagnosis not present

## 2016-09-30 DIAGNOSIS — R1033 Periumbilical pain: Secondary | ICD-10-CM | POA: Diagnosis not present

## 2016-10-02 ENCOUNTER — Other Ambulatory Visit: Payer: Self-pay | Admitting: *Deleted

## 2016-10-02 ENCOUNTER — Telehealth: Payer: Self-pay | Admitting: *Deleted

## 2016-10-02 DIAGNOSIS — N183 Chronic kidney disease, stage 3 unspecified: Secondary | ICD-10-CM

## 2016-10-02 DIAGNOSIS — I10 Essential (primary) hypertension: Secondary | ICD-10-CM

## 2016-10-02 MED ORDER — SPIRONOLACTONE 25 MG PO TABS: ORAL_TABLET | ORAL | 1 refills | Status: DC

## 2016-10-02 NOTE — Telephone Encounter (Signed)
OK. I'm going to add a new bp med called spironolactone that she will need to take every other day. Continue to monitoring bp and HR. Have her come in for lab visit in 2 weeks for BMET, dx essential hypertension and chronic renal insufficiency stage 3.--thx

## 2016-10-02 NOTE — Telephone Encounter (Signed)
Pt called and stated that at noon when she checks her BP her top number has been in the 170's. She states that it does come down in the evening but its still in the 150's. Please advise. Thanks.

## 2016-10-02 NOTE — Telephone Encounter (Signed)
Pt advised and voiced understanding. She will take this opposite the days the days lasix's (okay per Dr. Anitra Lauth). Future lab ordered.

## 2016-10-19 ENCOUNTER — Other Ambulatory Visit: Payer: Self-pay | Admitting: *Deleted

## 2016-10-19 ENCOUNTER — Other Ambulatory Visit (INDEPENDENT_AMBULATORY_CARE_PROVIDER_SITE_OTHER): Payer: Medicare Other

## 2016-10-19 DIAGNOSIS — I10 Essential (primary) hypertension: Secondary | ICD-10-CM | POA: Diagnosis not present

## 2016-10-19 DIAGNOSIS — N183 Chronic kidney disease, stage 3 unspecified: Secondary | ICD-10-CM

## 2016-10-19 LAB — BASIC METABOLIC PANEL
BUN: 51 mg/dL — AB (ref 6–23)
CHLORIDE: 103 meq/L (ref 96–112)
CO2: 26 meq/L (ref 19–32)
Calcium: 8.6 mg/dL (ref 8.4–10.5)
Creatinine, Ser: 2.3 mg/dL — ABNORMAL HIGH (ref 0.40–1.20)
GFR: 21.6 mL/min — ABNORMAL LOW (ref >60.00)
Glucose, Bld: 127 mg/dL — ABNORMAL HIGH (ref 70–99)
POTASSIUM: 3.7 meq/L (ref 3.5–5.1)
Sodium: 139 mEq/L (ref 135–145)

## 2016-10-19 MED ORDER — SPIRONOLACTONE 25 MG PO TABS: ORAL_TABLET | ORAL | 1 refills | Status: DC

## 2016-10-26 ENCOUNTER — Other Ambulatory Visit: Payer: Self-pay

## 2016-10-26 NOTE — Patient Outreach (Signed)
[facility]) Care Management  10/26/2016  Niamya Vittitow 1933/01/24 [id number]   Medication Adherence call to Mrs. [name] We call Mrs. [place] because under [facility]  is showing she is past due on her lisinopril 10 mg patient said she is no longer on medication doctor took her off an switch to a different medication per Mrs. Ewen.   [place] Management Direct Dial [phone]  Fax [phone] [email or web address]

## 2016-10-30 DIAGNOSIS — H6121 Impacted cerumen, right ear: Secondary | ICD-10-CM | POA: Diagnosis not present

## 2016-11-04 ENCOUNTER — Encounter: Payer: Self-pay | Admitting: Family Medicine

## 2016-11-04 ENCOUNTER — Ambulatory Visit (INDEPENDENT_AMBULATORY_CARE_PROVIDER_SITE_OTHER): Payer: Medicare Other | Admitting: Family Medicine

## 2016-11-04 VITALS — BP 177/74 | HR 66 | Temp 97.8°F | Resp 16 | Ht 59.0 in | Wt 117.8 lb

## 2016-11-04 DIAGNOSIS — I10 Essential (primary) hypertension: Secondary | ICD-10-CM

## 2016-11-04 DIAGNOSIS — M4850XD Collapsed vertebra, not elsewhere classified, site unspecified, subsequent encounter for fracture with routine healing: Secondary | ICD-10-CM

## 2016-11-04 DIAGNOSIS — E559 Vitamin D deficiency, unspecified: Secondary | ICD-10-CM | POA: Diagnosis not present

## 2016-11-04 DIAGNOSIS — R0609 Other forms of dyspnea: Secondary | ICD-10-CM | POA: Diagnosis not present

## 2016-11-04 DIAGNOSIS — N184 Chronic kidney disease, stage 4 (severe): Secondary | ICD-10-CM

## 2016-11-04 DIAGNOSIS — R609 Edema, unspecified: Secondary | ICD-10-CM

## 2016-11-04 DIAGNOSIS — IMO0001 Reserved for inherently not codable concepts without codable children: Secondary | ICD-10-CM

## 2016-11-04 NOTE — Patient Instructions (Signed)
Take your furosemide (lasix) 80mg  once every day. Take your spironolactone (aldactone) 25mg  once every day.  Make lab appointment for 11/09/16 (fasting is not necessary).

## 2016-11-04 NOTE — Progress Notes (Signed)
OFFICE VISIT  11/05/2016   CC:  Chief Complaint  Patient presents with  . Follow-up    HTN and Vertebral comp fx   HPI:    Patient is a 83 y.o. Caucasian female who presents for 2 mo f/u HTN, chronic bilat LE edema, and vertebral compression fracture. CRI stage IV--sees nephrology, most recent office note I have from them is 09/01/16. As of 10/02/16, her Cr bumped up some after being on lasix 80 mg daily to help with bilat LE edema.  I recommended she decrease dosing to 80mg  every other day at that time and let us know if LE edema starts to get too severe again. She is also taking her aldactone 25mg  qod. She feels like her LE swelling is slightly increased compared to 4-6 wks ago. Appetite is good, says she sleeps well.  She is eating low Na and low K diet. She does wear compression hose daily and elevates legs frequently.  BP 160-170 syst, 70s diast, HR 60s.   No HAs, no CP, no dizziness.  Her neurologist referred her to pain mgmt 09/16/16. She says today she has no significantly bothersome back pain.  She now takes oxycodone about 1/2-1 tab every 2 weeks. She never saw the pain mgmt office and her endocrine referral is next month but we discussed cancelling this due to her back being much improved.  Breathing is a bit more troublesome over the last 4-6 weeks: no SOB at rest, but occ SOB when awake lying on 2 pillows and also with activity/ambulation.  She says breathing returns to normal after 5-10 min at most.  Describes occ PND. When she has SOB she checks her pulse ox and it is 89% or more.  No cough or fevers. Her next f/u with Dr. Gwenlyn Found is 03/2017.   Past Medical History:  Diagnosis Date  . Atrophy of left kidney    with absent blood flow by renal artery dopplers (Dr. Gwenlyn Found)  . 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 Chronic renal insufficiency, stage 4 (severe) (HCC)    Baseline GFR 25 ml/min-- renal u/s showed atrophic/hypoplastic left kidney.  No  hydronephrosis.  Small right renal cyst.  . History of adenomatous polyp of colon   . History of subarachnoid hemorrhage 10/2014   after syncope and while on xarelto  . Hyperlipidemia   . Hypertension    Difficult to control, in the setting of one functioning kidney: pt was referred to nephrology by Dr. Gwenlyn Found 06/2015.  . Lumbar radiculopathy 2012  . Lumbar spondylosis    MR 07/2016---no sign of spinal nerve compression or cord compression.  Pt set up with outpt ortho while admitted to hosp 07/2016.  . Metatarsal fracture 06/10/2016   Nondisplaced, left 5th metatarsal--pt was referred to ortho  . Osteopenia 2014   T-score -2.1  . PAF (paroxysmal atrial fibrillation) (HCC)    Eliquis started after BRAO and CVA  . Stroke [facility])    cardioembolic (had CVA while on no anticoag)--"scattered subacute punctate infarcts: 1 in R parietal lobe and 2 in occipital cortex" on MRI br.  CT angio head/neck: aortic arch athero.   . Thoracic compression fracture ([place]) 07/2016   T7 and T8-- T8 kyphoplasty during hosp admission 07/2016.  Neuro referred pt to pain mgmt for consideration of injection 09/2016.  I referred her to endo 08/2016 for consideration of calcitonin treatment.    Past Surgical History:  Procedure Laterality Date  . APPENDECTOMY  child  .  CARDIOVASCULAR STRESS TEST  01/31/2016   Stress myoview: NORMAL/Low risk.  EF 56%.  . [place]  . COLONOSCOPY  2015   + hx of adenomatous polyps.  Need digest health spec in [place] records to see when pt due for next colonoscopy  . IR GENERIC HISTORICAL  07/24/2016   IR KYPHO THORACIC WITH BONE BIOPSY 07/24/2016 Luanne Bras, MD [facility]  . KYPHOPLASTY  07/27/2016   T8  . Renal artery dopplers  02/26/2016   Her right renal dimension was 11 cm pole to pole with mild to moderate right renal artery stenosis. A right renal aortic ratio was 3.22 suggesting less than a 50% stenosis.  . TONSILLECTOMY    . TRANSTHORACIC ECHOCARDIOGRAM   01/29/2016   EF 55-60%, normal LV wall motion, grade I DD.  No cardiac source of emboli was seen.    Outpatient Medications Prior to Visit  Medication Sig Dispense Refill  . acetaminophen (TYLENOL) 500 MG tablet Take 500 mg by mouth every 6 (six) hours as needed for moderate pain.    [name] amLODipine (NORVASC) 5 MG tablet Take 1 tablet (5 mg total) by mouth 2 (two) times daily with breakfast and lunch. 14 tablet 0  . apixaban (ELIQUIS) 2.5 MG TABS tablet Take 1 tablet (2.5 mg total) by mouth 2 (two) times daily. 60 tablet 1  . atenolol (TENORMIN) 100 MG tablet Take 1 tablet (100 mg total) by mouth daily. 90 tablet 1  . Calcium Carb-Cholecalciferol (CALCIUM/VITAMIN D PO) Take 1 tablet by mouth 2 (two) times daily.    . furosemide (LASIX) 80 MG tablet Take 1 tablet (80 mg total) by mouth daily. 90 tablet 1  . hydrALAZINE (APRESOLINE) 100 MG tablet Take 1 tablet (100 mg total) by mouth 3 (three) times daily. 30 tablet 0  . ibandronate (BONIVA) 150 MG tablet Take 1 tablet (150 mg total) by mouth every 30 (thirty) days. Take in the morning with a full glass of water, on an empty stomach, and do not take anything else by mouth or lie down for the next 30 min. 3 tablet 3  . Magnesium Gluconate (MAGNESIUM 27) 500 (27 Mg) MG TABS Take 500 mg by mouth 4 (four) times a week.     . Multiple Vitamin (MULTIVITAMIN) tablet Take 1 tablet by mouth 3 (three) times a week.     . ondansetron (ZOFRAN) 4 MG tablet Take 0.5-1 tablets (2-4 mg total) by mouth every 8 (eight) hours as needed for nausea or vomiting. 30 tablet 3  . oxyCODONE-acetaminophen (PERCOCET/ROXICET) 5-325 MG tablet Take 1-2 tablets by mouth every 6 (six) hours as needed for severe pain. 60 tablet 0  . pravastatin (PRAVACHOL) 40 MG tablet Take 40 mg by mouth every evening.    [name] spironolactone (ALDACTONE) 25 MG tablet 1 tab po every other day 45 tablet 1  . vitamin C (ASCORBIC ACID) 500 MG tablet Take 500 mg by mouth daily.    . Vitamin D,  Ergocalciferol, (DRISDOL) 50000 units CAPS capsule 1 cap po q week x 12 weeks 12 capsule 0   No facility-administered medications prior to visit.     Allergies  Allergen Reactions  . Clonidine Derivatives Palpitations and Other (See Comments)    Very sedated  . Codeine Nausea Only  . Nickel Rash  . Sulfa Antibiotics Other (See Comments)    Reaction unknown    ROS As per HPI  PE: Blood pressure (!) 177/74, pulse 66, temperature 97.8 F (36.6 C),  temperature source Oral, resp. rate 16, height 4\' 11"  (1.499 m), weight 117 lb 12 oz (53.4 kg), SpO2 90 %. Gen: Alert, well appearing.  Patient is oriented to person, place, time, and situation. AFFECT: pleasant, lucid thought and speech. Significant kyphotic posture/deformity. CV: RRR, very soft systolic murmur, with no ectopy, no rub/gallop. Chest is clear, no wheezing or rales. Normal symmetric air entry throughout both lung fields. No chest wall deformities or tenderness. EXT: 4+ pitting edema in LL's, most prominent in ankles.  LABS:    Chemistry      Component Value Date/Time   NA 139 10/19/2016 0927   NA 137 09/01/2016   K 3.7 10/19/2016 0927   CL 103 10/19/2016 0927   CO2 26 10/19/2016 0927   BUN 51 (H) 10/19/2016 0927   BUN 51 (A) 09/01/2016   CREATININE 2.30 (H) 10/19/2016 0927   CREATININE 1.63 (H) 03/24/2016 0110   GLU 128 09/01/2016      Component Value Date/Time   CALCIUM 8.6 10/19/2016 0927   ALKPHOS 55 09/24/2016 1434   AST 26 09/24/2016 1434   ALT 16 09/24/2016 1434   BILITOT 0.3 09/24/2016 1434   BILITOT 0.2 06/17/2016 0819     Lab Results  Component Value Date   WBC 8.7 09/24/2016   HGB 13.6 09/24/2016   HCT 41.1 09/24/2016   MCV 93.4 09/24/2016   PLT 325.0 09/24/2016   Lab Results  Component Value Date   HGBA1C 5.6 01/29/2016    IMPRESSION AND PLAN:  1) HTN, not very well controlled.  Unfortunately, due to HR, med intolerances, and severe renal insufficiency, her antihypertensive med  options are minimal to none at this time.  I'll see if increase in spironolactone to 25 mg DAILY instead of qod helps w/out bumping cr or potassium too high. Also, will resume lasix 80mg  qd for at least the short term in order to help her LE edema, and this may help bp some. Check lytes/cr today. Continue to monitor bp/hr at home.  2) Chronic LE edema; severe--increasing slowly since backing off on lasix dosing some. Will resume 80 mg qd dosing for at least the next 4d, then recheck BMET. Total protein and albumin were normal 09/2016.  3) DOE/PND: she may have a slight component of CHF due to her volume overload---although curiously her wt is 3 lbs down from last visit here 6 wks ago.  Echo 01/2016 showed only mild DD, o/w normal. Plan: see diuretic changes noted in #1 and #2 above.  4) Vertebral compression fx: MUCH improved-- pain from this is hardly affecting her at all now. May cancel endocrinologist appt. Continue boniva, vit D, calcium, and prn percocet.  An After Visit Summary was printed and given to the patient.  FOLLOW UP: Return in about 2 weeks (around 11/18/2016) for routine chronic illness f/u--also needs lab visit 11/09/16 for BMET.  Signed:  Crissie Sickles, MD           11/05/2016

## 2016-11-05 LAB — VITAMIN D 25 HYDROXY (VIT D DEFICIENCY, FRACTURES): VITD: 33.16 ng/mL (ref 30.00–100.00)

## 2016-11-05 LAB — BASIC METABOLIC PANEL
BUN: 57 mg/dL — ABNORMAL HIGH (ref 6–23)
CALCIUM: 9.1 mg/dL (ref 8.4–10.5)
CO2: 25 meq/L (ref 19–32)
Chloride: 100 mEq/L (ref 96–112)
Creatinine, Ser: 2.87 mg/dL — ABNORMAL HIGH (ref 0.40–1.20)
GFR: 16.73 mL/min — ABNORMAL LOW (ref >60.00)
Glucose, Bld: 125 mg/dL — ABNORMAL HIGH (ref 70–99)
Potassium: 3.6 mEq/L (ref 3.5–5.1)
SODIUM: 136 meq/L (ref 135–145)

## 2016-11-09 ENCOUNTER — Other Ambulatory Visit: Payer: Self-pay | Admitting: Family Medicine

## 2016-11-09 ENCOUNTER — Other Ambulatory Visit (INDEPENDENT_AMBULATORY_CARE_PROVIDER_SITE_OTHER): Payer: Medicare Other

## 2016-11-09 DIAGNOSIS — I10 Essential (primary) hypertension: Secondary | ICD-10-CM

## 2016-11-09 LAB — BASIC METABOLIC PANEL
BUN: 66 mg/dL — AB (ref 6–23)
CHLORIDE: 97 meq/L (ref 96–112)
CO2: 28 meq/L (ref 19–32)
CREATININE: 3.09 mg/dL — AB (ref 0.40–1.20)
Calcium: 9 mg/dL (ref 8.4–10.5)
GFR: 15.36 mL/min — ABNORMAL LOW (ref >60.00)
GLUCOSE: 130 mg/dL — AB (ref 70–99)
POTASSIUM: 3.8 meq/L (ref 3.5–5.1)
Sodium: 137 mEq/L (ref 135–145)

## 2016-11-09 MED ORDER — VITAMIN D (ERGOCALCIFEROL) 1.25 MG (50000 UNIT) PO CAPS: ORAL_CAPSULE | ORAL | 1 refills | Status: DC

## 2016-11-15 DIAGNOSIS — I495 Sick sinus syndrome: Secondary | ICD-10-CM

## 2016-11-15 HISTORY — DX: Sick sinus syndrome: I49.5

## 2016-11-23 ENCOUNTER — Ambulatory Visit (INDEPENDENT_AMBULATORY_CARE_PROVIDER_SITE_OTHER): Payer: Medicare Other | Admitting: Family Medicine

## 2016-11-23 ENCOUNTER — Encounter: Payer: Self-pay | Admitting: Family Medicine

## 2016-11-23 VITALS — BP 186/69 | HR 60 | Temp 97.5°F | Resp 16 | Ht 59.0 in | Wt 116.5 lb

## 2016-11-23 DIAGNOSIS — R21 Rash and other nonspecific skin eruption: Secondary | ICD-10-CM | POA: Diagnosis not present

## 2016-11-23 DIAGNOSIS — N184 Chronic kidney disease, stage 4 (severe): Secondary | ICD-10-CM | POA: Diagnosis not present

## 2016-11-23 DIAGNOSIS — I10 Essential (primary) hypertension: Secondary | ICD-10-CM | POA: Diagnosis not present

## 2016-11-23 DIAGNOSIS — R609 Edema, unspecified: Secondary | ICD-10-CM | POA: Diagnosis not present

## 2016-11-23 LAB — BASIC METABOLIC PANEL
BUN: 60 mg/dL — ABNORMAL HIGH (ref 6–23)
CO2: 26 mEq/L (ref 19–32)
Calcium: 9.4 mg/dL (ref 8.4–10.5)
Chloride: 96 mEq/L (ref 96–112)
Creatinine, Ser: 2.4 mg/dL — ABNORMAL HIGH (ref 0.40–1.20)
GFR: 20.56 mL/min — ABNORMAL LOW (ref >60.00)
Glucose, Bld: 116 mg/dL — ABNORMAL HIGH (ref 70–99)
POTASSIUM: 3.5 meq/L (ref 3.5–5.1)
SODIUM: 133 meq/L — AB (ref 135–145)

## 2016-11-23 MED ORDER — FLUTICASONE PROPIONATE 0.05 % EX CREA: TOPICAL_CREAM | Freq: Two times a day (BID) | CUTANEOUS | 1 refills | Status: DC

## 2016-11-23 NOTE — Progress Notes (Signed)
OFFICE VISIT  11/23/2016   CC:  Chief Complaint  Patient presents with  . Follow-up    RCI, pt is not fasting.    HPI:    Patient is a 83 y.o. Caucasian female who presents accompanied by her daughter for 3 week f/u uncontrolled HTN in the setting of severe CRI and multiple BP med allergies/intolerances. She also has chronic bilat LE edema. Last visit we did a 4 day increase in lasix to 80 mg qd instead of qod, and we changed her aldactone dosing frequency to qd instead of qod to try to help bp control as well as LE edema. However, after Cr bumped/GFR down slightly, I chose to just continue her on lasix qod alternating with aldactone qod. She feels like her edema is stable or maybe a little worse.  Wt is stable compared to last visit. Home bp's: 160-180s/60s, HR 68 avg Since last visit she has developed a rash mainly on L upper arm, sometimes itches, pink circular shaped lesion with some peripheral superficial peeling about the size of golf ball.  Other more papular lesions disperse on LL's and nasal crease on R.    She and her daughter have multiple questions today about her bp control issues and the question of whether or not her CRI is likely to progress to needing dialysis.  ROS: some HAs and malaise lately on and off.  Occ brief palpitations. No fevers, no myalgias.  No joint pains or swelling.  No melena/hematochezia.  Past Medical History:  Diagnosis Date  . Atrophy of left kidney    with absent blood flow by renal artery dopplers (Dr. Gwenlyn Found)  . 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 Chronic renal insufficiency, stage 4 (severe) (HCC)    Baseline GFR 25 ml/min-- renal u/s showed atrophic/hypoplastic left kidney.  No hydronephrosis.  Small right renal cyst.  . History of adenomatous polyp of colon   . History of subarachnoid hemorrhage 10/2014   after syncope and while on xarelto  . Hyperlipidemia   . Hypertension    Difficult to control, in the setting of one  functioning kidney: pt was referred to nephrology by Dr. Gwenlyn Found 06/2015.  . Lumbar radiculopathy 2012  . Lumbar spondylosis    MR 07/2016---no sign of spinal nerve compression or cord compression.  Pt set up with outpt ortho while admitted to hosp 07/2016.  . Metatarsal fracture 06/10/2016   Nondisplaced, left 5th metatarsal--pt was referred to ortho  . Osteopenia 2014   T-score -2.1  . PAF (paroxysmal atrial fibrillation) (HCC)    Eliquis started after BRAO and CVA  . Stroke [facility])    cardioembolic (had CVA while on no anticoag)--"scattered subacute punctate infarcts: 1 in R parietal lobe and 2 in occipital cortex" on MRI br.  CT angio head/neck: aortic arch athero.   . Thoracic compression fracture ([place]) 07/2016   T7 and T8-- T8 kyphoplasty during hosp admission 07/2016.  Neuro referred pt to pain mgmt for consideration of injection 09/2016.  I referred her to endo 08/2016 for consideration of calcitonin treatment.    Past Surgical History:  Procedure Laterality Date  . APPENDECTOMY  child  . CARDIOVASCULAR STRESS TEST  01/31/2016   Stress myoview: NORMAL/Low risk.  EF 56%.  . [place]  . COLONOSCOPY  2015   + hx of adenomatous polyps.  Need digest health spec in [place] records to see when pt due for next colonoscopy  . IR GENERIC HISTORICAL  07/24/2016   IR KYPHO THORACIC WITH BONE BIOPSY 07/24/2016 Luanne Bras, MD [facility]  . KYPHOPLASTY  07/27/2016   T8  . Renal artery dopplers  02/26/2016   Her right renal dimension was 11 cm pole to pole with mild to moderate right renal artery stenosis. A right renal aortic ratio was 3.22 suggesting less than a 50% stenosis.  . TONSILLECTOMY    . TRANSTHORACIC ECHOCARDIOGRAM  01/29/2016   EF 55-60%, normal LV wall motion, grade I DD.  No cardiac source of emboli was seen.    Outpatient Medications Prior to Visit  Medication Sig Dispense Refill  . acetaminophen (TYLENOL) 500 MG tablet Take 500 mg by mouth every 6 (six) hours  as needed for moderate pain.    [name] amLODipine (NORVASC) 5 MG tablet Take 1 tablet (5 mg total) by mouth 2 (two) times daily with breakfast and lunch. 14 tablet 0  . apixaban (ELIQUIS) 2.5 MG TABS tablet Take 1 tablet (2.5 mg total) by mouth 2 (two) times daily. 60 tablet 1  . atenolol (TENORMIN) 100 MG tablet Take 1 tablet (100 mg total) by mouth daily. 90 tablet 1  . Calcium Carb-Cholecalciferol (CALCIUM/VITAMIN D PO) Take 1 tablet by mouth 2 (two) times daily.    . furosemide (LASIX) 80 MG tablet Take 1 tablet (80 mg total) by mouth daily. 90 tablet 1  . hydrALAZINE (APRESOLINE) 100 MG tablet Take 1 tablet (100 mg total) by mouth 3 (three) times daily. 30 tablet 0  . ibandronate (BONIVA) 150 MG tablet Take 1 tablet (150 mg total) by mouth every 30 (thirty) days. Take in the morning with a full glass of water, on an empty stomach, and do not take anything else by mouth or lie down for the next 30 min. 3 tablet 3  . Multiple Vitamin (MULTIVITAMIN) tablet Take 1 tablet by mouth 3 (three) times a week.     . ondansetron (ZOFRAN) 4 MG tablet Take 0.5-1 tablets (2-4 mg total) by mouth every 8 (eight) hours as needed for nausea or vomiting. 30 tablet 3  . oxyCODONE-acetaminophen (PERCOCET/ROXICET) 5-325 MG tablet Take 1-2 tablets by mouth every 6 (six) hours as needed for severe pain. 60 tablet 0  . pravastatin (PRAVACHOL) 40 MG tablet Take 40 mg by mouth every evening.    [name] spironolactone (ALDACTONE) 25 MG tablet 1 tab po every other day 45 tablet 1  . vitamin C (ASCORBIC ACID) 500 MG tablet Take 500 mg by mouth daily.    . Vitamin D, Ergocalciferol, (DRISDOL) 50000 units CAPS capsule 1 cap po q week x 12 weeks 12 capsule 1  . Magnesium Gluconate (MAGNESIUM 27) 500 (27 Mg) MG TABS Take 500 mg by mouth 4 (four) times a week.      No facility-administered medications prior to visit.     Allergies  Allergen Reactions  . Clonidine Derivatives Palpitations and Other (See Comments)    Very sedated  .  Codeine Nausea Only  . Nickel Rash  . Sulfa Antibiotics Other (See Comments)    Reaction unknown   ROS As per HPI  PE: Blood pressure (!) 186/69, pulse 60, temperature (!) 97.5 F (36.4 C), temperature source Oral, resp. rate 16, height 4\' 11"  (1.499 m), weight 116 lb 8 oz (52.8 kg), SpO2 94 %. Gen: Alert, well appearing.  Patient is oriented to person, place, time, and situation. AFFECT: pleasant, lucid thought and speech. CV: RRR, no m/r/g.   LUNGS: CTA bilat, nonlabored resps, good aeration  in all lung fields. EXT: some bilat nonpitting edema noted--symmetric.  Also 1+ pitting edema bilat. Skin: a few pink papules located in R nasal crease.  L upper arm with pink plaque with some superficial peeling near the borders.  Borders distinct.  A couple of pink papules on LL's similar to the ones in R nasal crease. No hives, no target lesions, no pustules or vesicles.     LABS:    Chemistry      Component Value Date/Time   NA 137 11/09/2016 1113   NA 137 09/01/2016   K 3.8 11/09/2016 1113   CL 97 11/09/2016 1113   CO2 28 11/09/2016 1113   BUN 66 (H) 11/09/2016 1113   BUN 51 (A) 09/01/2016   CREATININE 3.09 (H) 11/09/2016 1113   CREATININE 1.63 (H) 03/24/2016 0110   GLU 128 09/01/2016      Component Value Date/Time   CALCIUM 9.0 11/09/2016 1113   ALKPHOS 55 09/24/2016 1434   AST 26 09/24/2016 1434   ALT 16 09/24/2016 1434   BILITOT 0.3 09/24/2016 1434   BILITOT 0.2 06/17/2016 0819      IMPRESSION AND PLAN:  1) Uncontrolled HTN: due to pt being maxed out on multiple meds + severe CRI +hx of med intolerances, we are extremely limited in further med mgmt.  Continue current meds at this time--but increase aldactone to QD dosing if K and Cr stable today. Cards referral for 2nd opinion re uncontrolled HTN--pt requested.  She has f/u with her original cardiologist in October (Dr. Gwenlyn Found).  2) CRI stage 4: avoid NSAIDs, maintain adequate hydration, trying to control BP. She has  f/u with nephrology in about 1 mo. Continue current bp meds (as per #1 above) and diuretic dosing.  3) LE edema, chronic--stable.  I told pt that due to hx of hyperkalemia and CRI, we likely will not get her LE edema any better. Our goal is to keep it from getting any worse.  Continue aldactone and lasix at current alternating qod dosing and adjust aldactone to qd if today's K and Cr stable.  4) Rash--suspect nonspecific eczematous eruption.  Doubt psoriasis or drug reaction. Try cutivate 0.05 % bid and we'll recheck this in 2-3 weeks.  An After Visit Summary was printed and given to the patient.  FOLLOW UP: Return 2-3 weeks f/u rash and HTN/CRI.  Signed:  Crissie Sickles, MD           11/23/2016

## 2016-11-24 ENCOUNTER — Other Ambulatory Visit: Payer: Self-pay | Admitting: *Deleted

## 2016-11-24 MED ORDER — SPIRONOLACTONE 25 MG PO TABS: 25.0000 mg | ORAL_TABLET | Freq: Every day | ORAL | 0 refills | Status: DC

## 2016-11-30 ENCOUNTER — Encounter (HOSPITAL_COMMUNITY): Payer: Self-pay | Admitting: Emergency Medicine

## 2016-11-30 ENCOUNTER — Telehealth: Payer: Self-pay | Admitting: *Deleted

## 2016-11-30 ENCOUNTER — Inpatient Hospital Stay (HOSPITAL_COMMUNITY)
Admission: EM | Admit: 2016-11-30 | Discharge: 2016-12-07 | DRG: 242 | Disposition: A | Discharge status: Home Health | Payer: Medicare Other | Attending: Family Medicine | Admitting: Family Medicine

## 2016-11-30 ENCOUNTER — Emergency Department (HOSPITAL_COMMUNITY): Payer: Medicare Other

## 2016-11-30 ENCOUNTER — Ambulatory Visit: Payer: Medicare Other | Admitting: Internal Medicine

## 2016-11-30 DIAGNOSIS — N183 Chronic kidney disease, stage 3 (moderate): Secondary | ICD-10-CM | POA: Diagnosis not present

## 2016-11-30 DIAGNOSIS — R7989 Other specified abnormal findings of blood chemistry: Secondary | ICD-10-CM

## 2016-11-30 DIAGNOSIS — N184 Chronic kidney disease, stage 4 (severe): Secondary | ICD-10-CM | POA: Diagnosis not present

## 2016-11-30 DIAGNOSIS — I5043 Acute on chronic combined systolic (congestive) and diastolic (congestive) heart failure: Secondary | ICD-10-CM | POA: Diagnosis not present

## 2016-11-30 DIAGNOSIS — R778 Other specified abnormalities of plasma proteins: Secondary | ICD-10-CM | POA: Diagnosis present

## 2016-11-30 DIAGNOSIS — Z8249 Family history of ischemic heart disease and other diseases of the circulatory system: Secondary | ICD-10-CM

## 2016-11-30 DIAGNOSIS — Q603 Renal hypoplasia, unilateral: Secondary | ICD-10-CM

## 2016-11-30 DIAGNOSIS — J9601 Acute respiratory failure with hypoxia: Secondary | ICD-10-CM | POA: Diagnosis not present

## 2016-11-30 DIAGNOSIS — I340 Nonrheumatic mitral (valve) insufficiency: Secondary | ICD-10-CM | POA: Diagnosis not present

## 2016-11-30 DIAGNOSIS — N179 Acute kidney failure, unspecified: Secondary | ICD-10-CM | POA: Diagnosis not present

## 2016-11-30 DIAGNOSIS — N189 Chronic kidney disease, unspecified: Secondary | ICD-10-CM | POA: Diagnosis not present

## 2016-11-30 DIAGNOSIS — Z881 Allergy status to other antibiotic agents status: Secondary | ICD-10-CM

## 2016-11-30 DIAGNOSIS — I248 Other forms of acute ischemic heart disease: Secondary | ICD-10-CM | POA: Diagnosis not present

## 2016-11-30 DIAGNOSIS — Z7983 Long term (current) use of bisphosphonates: Secondary | ICD-10-CM

## 2016-11-30 DIAGNOSIS — E785 Hyperlipidemia, unspecified: Secondary | ICD-10-CM

## 2016-11-30 DIAGNOSIS — I480 Paroxysmal atrial fibrillation: Secondary | ICD-10-CM

## 2016-11-30 DIAGNOSIS — I10 Essential (primary) hypertension: Secondary | ICD-10-CM

## 2016-11-30 DIAGNOSIS — E43 Unspecified severe protein-calorie malnutrition: Secondary | ICD-10-CM | POA: Diagnosis present

## 2016-11-30 DIAGNOSIS — J9621 Acute and chronic respiratory failure with hypoxia: Secondary | ICD-10-CM | POA: Diagnosis present

## 2016-11-30 DIAGNOSIS — Z6822 Body mass index (BMI) 22.0-22.9, adult: Secondary | ICD-10-CM

## 2016-11-30 DIAGNOSIS — Z86010 Personal history of colonic polyps: Secondary | ICD-10-CM

## 2016-11-30 DIAGNOSIS — R748 Abnormal levels of other serum enzymes: Secondary | ICD-10-CM | POA: Diagnosis not present

## 2016-11-30 DIAGNOSIS — Z79899 Other long term (current) drug therapy: Secondary | ICD-10-CM | POA: Diagnosis not present

## 2016-11-30 DIAGNOSIS — Z950 Presence of cardiac pacemaker: Secondary | ICD-10-CM | POA: Diagnosis not present

## 2016-11-30 DIAGNOSIS — J9811 Atelectasis: Secondary | ICD-10-CM | POA: Diagnosis not present

## 2016-11-30 DIAGNOSIS — I872 Venous insufficiency (chronic) (peripheral): Secondary | ICD-10-CM | POA: Diagnosis not present

## 2016-11-30 DIAGNOSIS — Z885 Allergy status to narcotic agent status: Secondary | ICD-10-CM

## 2016-11-30 DIAGNOSIS — I5041 Acute combined systolic (congestive) and diastolic (congestive) heart failure: Secondary | ICD-10-CM

## 2016-11-30 DIAGNOSIS — I501 Left ventricular failure: Secondary | ICD-10-CM | POA: Diagnosis present

## 2016-11-30 DIAGNOSIS — I5033 Acute on chronic diastolic (congestive) heart failure: Secondary | ICD-10-CM | POA: Diagnosis not present

## 2016-11-30 DIAGNOSIS — J810 Acute pulmonary edema: Secondary | ICD-10-CM | POA: Diagnosis not present

## 2016-11-30 DIAGNOSIS — R54 Age-related physical debility: Secondary | ICD-10-CM | POA: Diagnosis present

## 2016-11-30 DIAGNOSIS — R040 Epistaxis: Secondary | ICD-10-CM | POA: Diagnosis not present

## 2016-11-30 DIAGNOSIS — I495 Sick sinus syndrome: Secondary | ICD-10-CM | POA: Diagnosis not present

## 2016-11-30 DIAGNOSIS — I272 Pulmonary hypertension, unspecified: Secondary | ICD-10-CM | POA: Diagnosis present

## 2016-11-30 DIAGNOSIS — I129 Hypertensive chronic kidney disease with stage 1 through stage 4 chronic kidney disease, or unspecified chronic kidney disease: Secondary | ICD-10-CM | POA: Diagnosis not present

## 2016-11-30 DIAGNOSIS — D649 Anemia, unspecified: Secondary | ICD-10-CM | POA: Diagnosis present

## 2016-11-30 DIAGNOSIS — N170 Acute kidney failure with tubular necrosis: Secondary | ICD-10-CM | POA: Diagnosis not present

## 2016-11-30 DIAGNOSIS — I700 Atherosclerosis of aorta: Secondary | ICD-10-CM | POA: Diagnosis present

## 2016-11-30 DIAGNOSIS — E1122 Type 2 diabetes mellitus with diabetic chronic kidney disease: Secondary | ICD-10-CM | POA: Diagnosis present

## 2016-11-30 DIAGNOSIS — D631 Anemia in chronic kidney disease: Secondary | ICD-10-CM | POA: Diagnosis not present

## 2016-11-30 DIAGNOSIS — I428 Other cardiomyopathies: Secondary | ICD-10-CM

## 2016-11-30 DIAGNOSIS — I5023 Acute on chronic systolic (congestive) heart failure: Secondary | ICD-10-CM | POA: Diagnosis not present

## 2016-11-30 DIAGNOSIS — I701 Atherosclerosis of renal artery: Secondary | ICD-10-CM | POA: Diagnosis not present

## 2016-11-30 DIAGNOSIS — Z7902 Long term (current) use of antithrombotics/antiplatelets: Secondary | ICD-10-CM

## 2016-11-30 DIAGNOSIS — Z888 Allergy status to other drugs, medicaments and biological substances status: Secondary | ICD-10-CM

## 2016-11-30 DIAGNOSIS — R0602 Shortness of breath: Secondary | ICD-10-CM | POA: Diagnosis not present

## 2016-11-30 DIAGNOSIS — I130 Hypertensive heart and chronic kidney disease with heart failure and stage 1 through stage 4 chronic kidney disease, or unspecified chronic kidney disease: Secondary | ICD-10-CM | POA: Diagnosis not present

## 2016-11-30 DIAGNOSIS — J811 Chronic pulmonary edema: Secondary | ICD-10-CM

## 2016-11-30 DIAGNOSIS — Z8673 Personal history of transient ischemic attack (TIA), and cerebral infarction without residual deficits: Secondary | ICD-10-CM

## 2016-11-30 DIAGNOSIS — J90 Pleural effusion, not elsewhere classified: Secondary | ICD-10-CM | POA: Diagnosis not present

## 2016-11-30 LAB — URINALYSIS, ROUTINE W REFLEX MICROSCOPIC
Bilirubin Urine: NEGATIVE
Glucose, UA: NEGATIVE mg/dL
Hgb urine dipstick: NEGATIVE
Ketones, ur: NEGATIVE mg/dL
Nitrite: NEGATIVE
Protein, ur: 30 mg/dL — AB
Specific Gravity, Urine: 1.013 (ref 1.005–1.030)
pH: 5 (ref 5.0–8.0)

## 2016-11-30 LAB — BASIC METABOLIC PANEL
Anion gap: 14 (ref 5–15)
BUN: 93 mg/dL — ABNORMAL HIGH (ref 6–20)
CO2: 24 mmol/L (ref 22–32)
Calcium: 9.1 mg/dL (ref 8.9–10.3)
Chloride: 92 mmol/L — ABNORMAL LOW (ref 101–111)
Creatinine, Ser: 4.21 mg/dL — ABNORMAL HIGH (ref 0.44–1.00)
GFR calc Af Amer: 10 mL/min — ABNORMAL LOW (ref >60)
GFR calc non Af Amer: 9 mL/min — ABNORMAL LOW (ref >60)
Glucose, Bld: 123 mg/dL — ABNORMAL HIGH (ref 65–99)
Potassium: 3.9 mmol/L (ref 3.5–5.1)
Sodium: 130 mmol/L — ABNORMAL LOW (ref 135–145)

## 2016-11-30 LAB — CBC
HCT: 34.5 % — ABNORMAL LOW (ref 36.0–46.0)
Hemoglobin: 11.3 g/dL — ABNORMAL LOW (ref 12.0–15.0)
MCH: 29.7 pg (ref 26.0–34.0)
MCHC: 32.8 g/dL (ref 30.0–36.0)
MCV: 90.6 fL (ref 78.0–100.0)
Platelets: 246 10*3/uL (ref 150–400)
RBC: 3.81 MIL/uL — ABNORMAL LOW (ref 3.87–5.11)
RDW: 13.6 % (ref 11.5–15.5)
WBC: 7 10*3/uL (ref 4.0–10.5)

## 2016-11-30 LAB — TROPONIN I
Troponin I: 0.11 ng/mL (ref <0.03)
Troponin I: 0.09 ng/mL (ref <0.03)

## 2016-11-30 LAB — CBG MONITORING, ED: Glucose-Capillary: 125 mg/dL — ABNORMAL HIGH (ref 65–99)

## 2016-11-30 LAB — BRAIN NATRIURETIC PEPTIDE: B Natriuretic Peptide: >4500 pg/mL — ABNORMAL HIGH (ref 0.0–100.0)

## 2016-11-30 MED ORDER — FUROSEMIDE 10 MG/ML IJ SOLN
80.0000 mg | Freq: Once | INTRAMUSCULAR | Status: AC
  Administered 2016-11-30: 80 mg via INTRAVENOUS
  Filled 2016-11-30: qty 8

## 2016-11-30 MED ORDER — ASPIRIN 81 MG PO CHEW
324.0000 mg | CHEWABLE_TABLET | Freq: Once | ORAL | Status: AC
  Administered 2016-11-30: 324 mg via ORAL
  Filled 2016-11-30: qty 4

## 2016-11-30 NOTE — Telephone Encounter (Signed)
Spoke with patient. Advised patient to go to the Emergency Room for assessment/treatment. Explained the dangers of low oxygen saturation.  Patient states she has been suffering with multiple medical conditions and realizes what this means. States she does not want to spend the rest of her time in the hospital or accrue more medical bills. Strongly encouraged to go to the Emergency Room, patient states she will think about it.  Spoke with daughter [name] who is torn with what to do. She wants to take patient to the hospital, but the patient is refusing. She will continue to attempt to take her to the hospital.

## 2016-11-30 NOTE — ED Triage Notes (Signed)
Daughter stated/pt, shes been feeling bad with SOB and weakness but it seems to be worse since the weekend.

## 2016-11-30 NOTE — ED Provider Notes (Signed)
[facility] DEPT Provider Note   CSN: [id number] Arrival date & time: 11/30/16  1300     History   Chief Complaint Chief Complaint  Patient presents with  . Shortness of Breath  . Weakness    HPI Valerie Terrell is a 83 y.o. female.   Shortness of Breath  This is a new problem. The average episode lasts 2 weeks. The problem occurs continuously.The problem has been gradually worsening. Associated symptoms include orthopnea. Pertinent negatives include no fever. She has tried nothing for the symptoms. She has had prior hospitalizations. Associated medical issues include heart failure.    Past Medical History:  Diagnosis Date  . Atrophy of left kidney    with absent blood flow by renal artery dopplers (Dr. Gwenlyn Found)  . 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 Chronic renal insufficiency, stage 4 (severe) (HCC)    Baseline GFR 25 ml/min-- renal u/s showed atrophic/hypoplastic left kidney.  No hydronephrosis.  Small right renal cyst.  . History of adenomatous polyp of colon   . History of subarachnoid hemorrhage 10/2014   after syncope and while on xarelto  . Hyperlipidemia   . Hypertension    Difficult to control, in the setting of one functioning kidney: pt was referred to nephrology by Dr. Gwenlyn Found 06/2015.  . Lumbar radiculopathy 2012  . Lumbar spondylosis    MR 07/2016---no sign of spinal nerve compression or cord compression.  Pt set up with outpt ortho while admitted to hosp 07/2016.  . Metatarsal fracture 06/10/2016   Nondisplaced, left 5th metatarsal--pt was referred to ortho  . Osteopenia 2014   T-score -2.1  . PAF (paroxysmal atrial fibrillation) (HCC)    Eliquis started after BRAO and CVA  . Stroke [facility])    cardioembolic (had CVA while on no anticoag)--"scattered subacute punctate infarcts: 1 in R parietal lobe and 2 in occipital cortex" on MRI br.  CT angio head/neck: aortic arch athero.   . Thoracic compression fracture ([place]) 07/2016   T7 and T8-- T8  kyphoplasty during hosp admission 07/2016.  Neuro referred pt to pain mgmt for consideration of injection 09/2016.  I referred her to endo 08/2016 for consideration of calcitonin treatment.    Patient Active Problem List   Diagnosis Date Noted  . Chronic midline thoracic back pain 09/15/2016  . Acute CVA (cerebrovascular accident) ([place]) 09/15/2016  . Hyperlipidemia   . Nausea & vomiting 08/05/2016  . Dehydration 08/05/2016  . AKI (acute kidney injury) ([place]) 08/05/2016  . Osteoporosis 07/28/2016  . Closed compression fracture of thoracic vertebra ([place]) 07/23/2016  . Thoracic compression fracture ([place]) 07/22/2016  . Acute renal failure superimposed on chronic kidney disease ([place]) 07/11/2016  . Flank pain 07/11/2016  . Hypoalbuminemia 07/11/2016  . Renal artery stenosis ([place]) 06/24/2016  . Nondisplaced fracture of fifth metatarsal bone, left foot, initial encounter for closed fracture 06/10/2016  . Rib contusion, left, initial encounter 06/10/2016  . Acute on chronic renal insufficiency 03/13/2016  . Single kidney 03/13/2016  . Chest pain   . Dyslipidemia   . PAF (paroxysmal atrial fibrillation) ([place]) 01/28/2016  . Essential hypertension 01/28/2016  . Cardioembolic stroke ([place]) 56/38/7564  . 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 Personal history of subarachnoid hemorrhage 01/28/2016  . CKD (chronic kidney disease), stage II 01/28/2016    Past Surgical History:  Procedure Laterality Date  . APPENDECTOMY  child  . CARDIOVASCULAR STRESS TEST  01/31/2016   Stress myoview: NORMAL/Low risk.  EF 56%.  [name]  [place]  . COLONOSCOPY  2015   + hx of adenomatous polyps.  Need digest health spec in [place] records to see when pt due for next colonoscopy  . IR GENERIC HISTORICAL  07/24/2016   IR KYPHO THORACIC WITH BONE BIOPSY 07/24/2016 Luanne Bras, MD [facility]  . KYPHOPLASTY  07/27/2016   T8  . Renal artery dopplers  02/26/2016   Her right renal  dimension was 11 cm pole to pole with mild to moderate right renal artery stenosis. A right renal aortic ratio was 3.22 suggesting less than a 50% stenosis.  . TONSILLECTOMY    . TRANSTHORACIC ECHOCARDIOGRAM  01/29/2016   EF 55-60%, normal LV wall motion, grade I DD.  No cardiac source of emboli was seen.    OB History    No data available       Home Medications    Prior to Admission medications   Medication Sig Start Date End Date Taking? Authorizing Provider  acetaminophen (TYLENOL) 500 MG tablet Take 500 mg by mouth every 6 (six) hours as needed for moderate pain.   Yes [provider]  amLODipine (NORVASC) 5 MG tablet Take 1 tablet (5 mg total) by mouth 2 (two) times daily with breakfast and lunch. 09/22/16  Yes McGowen, Adrian Blackwater, MD  apixaban (ELIQUIS) 2.5 MG TABS tablet Take 1 tablet (2.5 mg total) by mouth 2 (two) times daily. 08/25/16  Yes Lorretta Harp, MD  atenolol (TENORMIN) 100 MG tablet Take 1 tablet (100 mg total) by mouth daily. 09/28/16  Yes McGowen, Adrian Blackwater, MD  Calcium Carb-Cholecalciferol (CALCIUM/VITAMIN D PO) Take 1 tablet by mouth 2 (two) times daily.   Yes [provider]  fluticasone (CUTIVATE) 0.05 % cream Apply topically 2 (two) times daily. 11/23/16  Yes McGowen, Adrian Blackwater, MD  furosemide (LASIX) 80 MG tablet Take 1 tablet (80 mg total) by mouth daily. Patient taking differently: Take 80 mg by mouth every other day.  09/08/16  Yes McGowen, Adrian Blackwater, MD  hydrALAZINE (APRESOLINE) 100 MG tablet Take 1 tablet (100 mg total) by mouth 3 (three) times daily. 08/12/16  Yes McGowen, Adrian Blackwater, MD  ibandronate (BONIVA) 150 MG tablet Take 1 tablet (150 mg total) by mouth every 30 (thirty) days. Take in the morning with a full glass of water, on an empty stomach, and do not take anything else by mouth or lie down for the next 30 min. 08/12/16  Yes McGowen, Adrian Blackwater, MD  Multiple Vitamin (MULTIVITAMIN) tablet Take 1 tablet by mouth 3 (three) times a week.    Yes  [provider]  ondansetron (ZOFRAN) 4 MG tablet Take 0.5-1 tablets (2-4 mg total) by mouth every 8 (eight) hours as needed for nausea or vomiting. 07/30/16  Yes McGowen, Adrian Blackwater, MD  oxyCODONE-acetaminophen (PERCOCET/ROXICET) 5-325 MG tablet Take 1-2 tablets by mouth every 6 (six) hours as needed for severe pain. 08/24/16  Yes McGowen, Adrian Blackwater, MD  pravastatin (PRAVACHOL) 40 MG tablet Take 40 mg by mouth every evening. 07/16/15  Yes [provider]  spironolactone (ALDACTONE) 25 MG tablet Take 1 tablet (25 mg total) by mouth daily. 1 tab po every other day Patient taking differently: Take 25 mg by mouth daily.  11/24/16  Yes McGowen, Adrian Blackwater, MD  vitamin C (ASCORBIC ACID) 500 MG tablet Take 500 mg by mouth daily.   Yes [provider]  Vitamin D, Ergocalciferol, (DRISDOL) 50000 units CAPS capsule 1 cap po q week x 12  weeks Patient taking differently: Take 50,000 Units by mouth every 7 (seven) days. For 12 weeks. (Thursdays) 11/09/16  Yes McGowen, Adrian Blackwater, MD    Family History Family History  Problem Relation Age of Onset  . Heart disease Father   . Early death Father   . Sudden Cardiac Death Neg Hx     Social History Social History  Substance Use Topics  . Smoking status: Never Smoker  . Smokeless tobacco: Never Used  . Alcohol use 0.6 oz/week    1 Glasses of wine per week     Comment: wine     Allergies   Clonidine derivatives; Codeine; Nickel; and Sulfa antibiotics   Review of Systems Review of Systems  Constitutional: Negative for fever.  Respiratory: Positive for shortness of breath.   Cardiovascular: Positive for orthopnea.  All other systems reviewed and are negative.    Physical Exam Updated Vital Signs BP (!) 155/67 (BP Location: Left Arm)   Pulse 63   Temp 97.6 F (36.4 C) (Oral)   Resp (!) 22   SpO2 93%   Physical Exam  Constitutional: She appears well-developed and well-nourished.  HENT:  Head: Normocephalic and atraumatic.    Eyes: Conjunctivae and EOM are normal.  Neck: Normal range of motion.  Cardiovascular: Normal rate and regular rhythm.   Pulmonary/Chest: Effort normal. No stridor. No respiratory distress. She has no wheezes. She has rales.  Abdominal: Soft. She exhibits no distension.  Musculoskeletal: She exhibits edema (2+ to knees).  Neurological: She is alert.  Skin: Skin is warm and dry.  Nursing note and vitals reviewed.    ED Treatments / Results  Labs (all labs ordered are listed, but only abnormal results are displayed) Labs Reviewed  BASIC METABOLIC PANEL - Abnormal; Notable for the following:       Result Value   Sodium 130 (*)    Chloride 92 (*)    Glucose, Bld 123 (*)    BUN 93 (*)    Creatinine, Ser 4.21 (*)    GFR calc non Af Amer 9 (*)    GFR calc Af Amer 10 (*)    All other components within normal limits  CBC - Abnormal; Notable for the following:    RBC 3.81 (*)    Hemoglobin 11.3 (*)    HCT 34.5 (*)    All other components within normal limits  URINALYSIS, ROUTINE W REFLEX MICROSCOPIC - Abnormal; Notable for the following:    Protein, ur 30 (*)    Leukocytes, UA SMALL (*)    Bacteria, UA RARE (*)    Squamous Epithelial / LPF 0-5 (*)    All other components within normal limits  TROPONIN I - Abnormal; Notable for the following:    Troponin I 0.11 (*)    All other components within normal limits  CBG MONITORING, ED - Abnormal; Notable for the following:    Glucose-Capillary 125 (*)    All other components within normal limits  BRAIN NATRIURETIC PEPTIDE    EKG  EKG Interpretation  Date/Time:  Monday November 30 2016 13:37:36 EDT Ventricular Rate:  65 PR Interval:  168 QRS Duration: 90 QT Interval:  414 QTC Calculation: 430 R Axis:   107 Text Interpretation:  Normal sinus rhythm Rightward axis RSR' or QR pattern in V1 suggests right ventricular conduction delay Borderline ECG Confirmed by Merrily Pew [phone]) on 11/30/2016 4:15:17 PM       Radiology Dg  Chest 2 View  Result Date: 11/30/2016 CLINICAL  DATA:  83 y/o  F; shortness of breath. EXAM: CHEST  2 VIEW COMPARISON:  08/05/2016 chest radiograph FINDINGS: Stable moderate cardiomegaly. Aortic atherosclerosis with calcification. Stable small left pleural effusion. Stable left lung base opacity. Interstitial pulmonary edema. Increased perihilar haziness probably represents developing alveolar edema. Midthoracic compression deformity post kyphoplasty. IMPRESSION: Interstitial and mild alveolar pulmonary edema. Small left pleural effusion with left basilar opacity probably representing associated atelectasis. Stable cardiomegaly. Electronically Signed   By: Kristine Garbe M.D.   On: 11/30/2016 17:57    Procedures Procedures (including critical care time)  CRITICAL CARE Performed by: Merrily Pew Total critical care time: 35 minutes Critical care time was exclusive of separately billable procedures and treating other patients. Critical care was necessary to treat or prevent imminent or life-threatening deterioration. Critical care was time spent personally by me on the following activities: development of treatment plan with patient and/or surrogate as well as nursing, discussions with consultants, evaluation of patient's response to treatment, examination of patient, obtaining history from patient or surrogate, ordering and performing treatments and interventions, ordering and review of laboratory studies, ordering and review of radiographic studies, pulse oximetry and re-evaluation of patient's condition.   Medications Ordered in ED Medications - No data to display   Initial Impression / Assessment and Plan / ED Course  I have reviewed the triage vital signs and the nursing notes.  Pertinent labs & imaging results that were available during my care of the patient were reviewed by me and considered in my medical decision making (see chart for details).     Likely CHF exacerbation.  New o2 requirement. Plan for lasix/admission for same. Multiple reassessments without worsening symptoms. No significant deistress requiring bipap or ntg infusion currently.   Final Clinical Impressions(s) / ED Diagnoses   Final diagnoses:  Acute respiratory failure with hypoxia ([place])  Chronic pulmonary edema  Acute renal failure superimposed on chronic kidney disease, unspecified CKD stage, unspecified acute renal failure type [facility])     Shahiem Bedwell, Corene Cornea, MD 12/01/16 1629

## 2016-11-30 NOTE — ED Triage Notes (Signed)
Pt. On arrival O2 SATS were 88. Administered Oxygen to 3 L increased to 95%.

## 2016-11-30 NOTE — Telephone Encounter (Signed)
Pts daughter LMOM on 11/30/16 at 8:37am stating that pt has had shortness of breath especially when she moves around and has had O2's in the 70's over the weekend. She wanted to know if it was time to conceder O2 for pt.   I got pts daughter on the phone and transferred call to [facility] for triage.

## 2016-11-30 NOTE — ED Notes (Signed)
Attempted report x1. 

## 2016-11-30 NOTE — Telephone Encounter (Signed)
Spoke with patients daughter regarding symptoms. Daughter reports patient has been short of breath x 3 days, pulse oximetry currently 72% on room air. She is requesting provider to prescribe home oxygen. Advised daughter to have patient evaluated at Emergency Room/Urgent Care, daughter hesitant because patient does not want to go. Encouraged Emergency Room evaluation for her current oxygen need,patient's daughter verbalized understanding.

## 2016-11-30 NOTE — ED Notes (Signed)
Patient transported to X-ray 

## 2016-11-30 NOTE — Telephone Encounter (Signed)
Agree.  This pt needs evaluation in emergency room for acute hypoxic respiratory failure.

## 2016-11-30 NOTE — H&P (Signed)
History and Physical    Valerie Terrell HMC:[id number] DOB: 05-06-1933 DOA: 11/30/2016  PCP: Tammi Sou, MD   Patient coming from: home.  I have personally briefly reviewed patient's old medical records in [place]  Chief Complaint: Shortness of breath and weakness  HPI: Valerie Terrell is a 83 y.o. female with medical history significant of left kidney atrophy, retinal artery occlusion in the left eye, stage IV chronic renal insufficiency, history of subarachnoid hemorrhage, hyperlipidemia, hypertension, lumbar spondylosis, paroxysmal atrial fibrillation on apixaban who is coming to the emergency department with complaints of progressively worse dyspnea, lower extremity edema, orthopnea and weakness for the past 2 weeks, but particularly worse in over the weekend. She has also had mild nausea, occasional palpitations and dizziness. She denies dietary indiscretions. She denies fever, chills, chest pain,  diaphoresis, abdominal pain, emesis, diarrhea, constipation, melena, hematochezia, dysuria, frequency or hematuria.  ED Course: Initial vital signs in the emergency department temperature 97.27F, pulse 63, blood pressure 155/67 mmHg, respirations 22 and O2 sat 93% on room air. Her WBC is 7.0, hemoglobin 11.3 g/dL and platelets 246. Her troponin level was 0.11, BNP over 4500, sodium 130, potassium 3.9, chloride 92, bicarbonate 24 mmol/L. Her BUN was 93, creatinine 4.21 and glucose 123 mg/dL. Her creatinine level was 2.4 mg/dL a week ago. Her chest radiograph shows interstitial and mild alveolar edema with left pleural effusion.  She received furosemide 80 mg IVP and has voided twice since then.  Review of Systems: As per HPI otherwise 10 point review of systems negative.    Past Medical History:  Diagnosis Date  . Atrophy of left kidney    with absent blood flow by renal artery dopplers (Dr. Gwenlyn Found)  . 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 Chronic renal  insufficiency, stage 4 (severe) (HCC)    Baseline GFR 25 ml/min-- renal u/s showed atrophic/hypoplastic left kidney.  No hydronephrosis.  Small right renal cyst.  . History of adenomatous polyp of colon   . History of subarachnoid hemorrhage 10/2014   after syncope and while on xarelto  . Hyperlipidemia   . Hypertension    Difficult to control, in the setting of one functioning kidney: pt was referred to nephrology by Dr. Gwenlyn Found 06/2015.  . Lumbar radiculopathy 2012  . Lumbar spondylosis    MR 07/2016---no sign of spinal nerve compression or cord compression.  Pt set up with outpt ortho while admitted to hosp 07/2016.  . Metatarsal fracture 06/10/2016   Nondisplaced, left 5th metatarsal--pt was referred to ortho  . Osteopenia 2014   T-score -2.1  . PAF (paroxysmal atrial fibrillation) (HCC)    Eliquis started after BRAO and CVA  . Stroke [facility])    cardioembolic (had CVA while on no anticoag)--"scattered subacute punctate infarcts: 1 in R parietal lobe and 2 in occipital cortex" on MRI br.  CT angio head/neck: aortic arch athero.   . Thoracic compression fracture ([place]) 07/2016   T7 and T8-- T8 kyphoplasty during hosp admission 07/2016.  Neuro referred pt to pain mgmt for consideration of injection 09/2016.  I referred her to endo 08/2016 for consideration of calcitonin treatment.    Past Surgical History:  Procedure Laterality Date  . APPENDECTOMY  child  . CARDIOVASCULAR STRESS TEST  01/31/2016   Stress myoview: NORMAL/Low risk.  EF 56%.  . [place]  . COLONOSCOPY  2015   + hx of adenomatous polyps.  Need digest health spec in [place] records to see  when pt due for next colonoscopy  . IR GENERIC HISTORICAL  07/24/2016   IR KYPHO THORACIC WITH BONE BIOPSY 07/24/2016 Luanne Bras, MD [facility]-INTER[facility] RAD  . KYPHOPLASTY  07/27/2016   T8  . Renal artery dopplers  02/26/2016   Her right renal dimension was 11 cm pole to pole with mild to moderate right renal artery stenosis. A right  renal aortic ratio was 3.22 suggesting less than a 50% stenosis.  . TONSILLECTOMY    . TRANSTHORACIC ECHOCARDIOGRAM  01/29/2016   EF 55-60%, normal LV wall motion, grade I DD.  No cardiac source of emboli was seen.     reports that she has never smoked. She has never used smokeless tobacco. She reports that she drinks about 0.6 oz of alcohol per week . She reports that she does not use drugs.  Allergies  Allergen Reactions  . Clonidine Derivatives Palpitations and Other (See Comments)    Very sedated  . Codeine Nausea Only  . Nickel Rash  . Sulfa Antibiotics Other (See Comments)    Reaction unknown    Family History  Problem Relation Age of Onset  . Cancer Mother   . Heart disease Father   . Early death Father   . Sudden Cardiac Death Neg Hx     Prior to Admission medications   Medication Sig Start Date End Date Taking? Authorizing Provider  acetaminophen (TYLENOL) 500 MG tablet Take 500 mg by mouth every 6 (six) hours as needed for moderate pain.   Yes [provider]  amLODipine (NORVASC) 5 MG tablet Take 1 tablet (5 mg total) by mouth 2 (two) times daily with breakfast and lunch. 09/22/16  Yes McGowen, Adrian Blackwater, MD  apixaban (ELIQUIS) 2.5 MG TABS tablet Take 1 tablet (2.5 mg total) by mouth 2 (two) times daily. 08/25/16  Yes Lorretta Harp, MD  atenolol (TENORMIN) 100 MG tablet Take 1 tablet (100 mg total) by mouth daily. 09/28/16  Yes McGowen, Adrian Blackwater, MD  Calcium Carb-Cholecalciferol (CALCIUM/VITAMIN D PO) Take 1 tablet by mouth 2 (two) times daily.   Yes [provider]  fluticasone (CUTIVATE) 0.05 % cream Apply topically 2 (two) times daily. 11/23/16  Yes McGowen, Adrian Blackwater, MD  furosemide (LASIX) 80 MG tablet Take 1 tablet (80 mg total) by mouth daily. Patient taking differently: Take 80 mg by mouth every other day.  09/08/16  Yes McGowen, Adrian Blackwater, MD  hydrALAZINE (APRESOLINE) 100 MG tablet Take 1 tablet (100 mg total) by mouth 3 (three) times daily. 08/12/16   Yes McGowen, Adrian Blackwater, MD  ibandronate (BONIVA) 150 MG tablet Take 1 tablet (150 mg total) by mouth every 30 (thirty) days. Take in the morning with a full glass of water, on an empty stomach, and do not take anything else by mouth or lie down for the next 30 min. 08/12/16  Yes McGowen, Adrian Blackwater, MD  Multiple Vitamin (MULTIVITAMIN) tablet Take 1 tablet by mouth 3 (three) times a week.    Yes [provider]  ondansetron (ZOFRAN) 4 MG tablet Take 0.5-1 tablets (2-4 mg total) by mouth every 8 (eight) hours as needed for nausea or vomiting. 07/30/16  Yes McGowen, Adrian Blackwater, MD  oxyCODONE-acetaminophen (PERCOCET/ROXICET) 5-325 MG tablet Take 1-2 tablets by mouth every 6 (six) hours as needed for severe pain. 08/24/16  Yes McGowen, Adrian Blackwater, MD  pravastatin (PRAVACHOL) 40 MG tablet Take 40 mg by mouth every evening. 07/16/15  Yes [provider]  spironolactone (ALDACTONE) 25 MG tablet  Take 1 tablet (25 mg total) by mouth daily. 1 tab po every other day Patient taking differently: Take 25 mg by mouth daily.  11/24/16  Yes McGowen, Adrian Blackwater, MD  vitamin C (ASCORBIC ACID) 500 MG tablet Take 500 mg by mouth daily.   Yes [provider]  Vitamin D, Ergocalciferol, (DRISDOL) 50000 units CAPS capsule 1 cap po q week x 12 weeks Patient taking differently: Take 50,000 Units by mouth every 7 (seven) days. For 12 weeks. (Thursdays) 11/09/16  Yes Tammi Sou, MD    Physical Exam: Vitals:   11/30/16 2015 11/30/16 2030 11/30/16 2145 11/30/16 2230  BP: (!) 153/66 (!) 151/69 (!) 156/87 (!) 156/70  Pulse: (!) 59 60 (!) 56   Resp: 20 20 16 16   Temp:    97.7 F (36.5 C)  TempSrc:    Oral  SpO2: 94% 96% 97% 90%  Weight:    52.1 kg (114 lb 14.4 oz)  Height:    4\' 11"  (1.499 m)    Constitutional: NAD, calm, comfortable Eyes: PERRL, lids and conjunctivae normal ENMT: Mucous membranes are moist. Posterior pharynx clear of any exudate or lesions. Neck: normal, supple, no masses, no  thyromegaly Respiratory: Bilateral rales on lower and mid lung fields, no wheezing, no crackles. Normal respiratory effort. No accessory muscle use.  Cardiovascular: Regular rate and rhythm, no murmurs / rubs / gallops. 2+ lower extremity edema. 2+ pedal pulses. No carotid bruits.  Abdomen: Soft, no tenderness, no masses palpated. No hepatosplenomegaly. Bowel sounds positive.  Musculoskeletal: no clubbing / cyanosis. Good ROM, no contractures. Normal muscle tone.  Skin: no rashes, lesions, ulcers on limited skin exam. Neurologic: CN 2-12 grossly intact. Sensation intact, DTR normal. Strength 5/5 in all 4.  Psychiatric: Normal judgment and insight. Alert and oriented x 4. Normal mood.    Labs on Admission: I have personally reviewed following labs and imaging studies  CBC:  Recent Labs Lab 11/30/16 1331  WBC 7.0  HGB 11.3*  HCT 34.5*  MCV 90.6  PLT 616   Basic Metabolic Panel:  Recent Labs Lab 11/30/16 1331  NA 130*  K 3.9  CL 92*  CO2 24  GLUCOSE 123*  BUN 93*  CREATININE 4.21*  CALCIUM 9.1   GFR: Estimated Creatinine Clearance: 7.7 mL/min (A) (by C-G formula based on SCr of 4.21 mg/dL (H)). Liver Function Tests: No results for input(s): AST, ALT, ALKPHOS, BILITOT, PROT, ALBUMIN in the last 168 hours. No results for input(s): LIPASE, AMYLASE in the last 168 hours. No results for input(s): AMMONIA in the last 168 hours. Coagulation Profile: No results for input(s): INR, PROTIME in the last 168 hours. Cardiac Enzymes:  Recent Labs Lab 11/30/16 1331 11/30/16 2033  TROPONINI 0.11* 0.09*   BNP (last 3 results) No results for input(s): PROBNP in the last 8760 hours. HbA1C: No results for input(s): HGBA1C in the last 72 hours. CBG:  Recent Labs Lab 11/30/16 1617  GLUCAP 125*   Lipid Profile: No results for input(s): CHOL, HDL, LDLCALC, TRIG, CHOLHDL, LDLDIRECT in the last 72 hours. Thyroid Function Tests: No results for input(s): TSH, T4TOTAL, FREET4,  T3FREE, THYROIDAB in the last 72 hours. Anemia Panel: No results for input(s): VITAMINB12, FOLATE, FERRITIN, TIBC, IRON, RETICCTPCT in the last 72 hours. Urine analysis:    Component Value Date/Time   COLORURINE YELLOW 11/30/2016 1610   APPEARANCEUR CLEAR 11/30/2016 1610   LABSPEC 1.013 11/30/2016 1610   PHURINE 5.0 11/30/2016 1610   GLUCOSEU NEGATIVE 11/30/2016 1610  HGBUR NEGATIVE 11/30/2016 1610   BILIRUBINUR NEGATIVE 11/30/2016 1610   BILIRUBINUR negative 09/24/2016 [place] 11/30/2016 1610   PROTEINUR 30 (A) 11/30/2016 1610   UROBILINOGEN 0.2 09/24/2016 1453   NITRITE NEGATIVE 11/30/2016 1610   LEUKOCYTESUR SMALL (A) 11/30/2016 1610    Radiological Exams on Admission: Dg Chest 2 View  Result Date: 11/30/2016 CLINICAL DATA:  83 y/o  F; shortness of breath. EXAM: CHEST  2 VIEW COMPARISON:  08/05/2016 chest radiograph FINDINGS: Stable moderate cardiomegaly. Aortic atherosclerosis with calcification. Stable small left pleural effusion. Stable left lung base opacity. Interstitial pulmonary edema. Increased perihilar haziness probably represents developing alveolar edema. Midthoracic compression deformity post kyphoplasty. IMPRESSION: Interstitial and mild alveolar pulmonary edema. Small left pleural effusion with left basilar opacity probably representing associated atelectasis. Stable cardiomegaly. Electronically Signed   By: Kristine Garbe M.D.   On: 11/30/2016 17:57  O2 27 2018 echocardiogram complete ------------------------------------------------------------------- LV EF: 60% -   65%  ------------------------------------------------------------------- History:   PMH:  cardiomegaly. 429.3.  Atrial fibrillation. Congestive heart failure.  PMH:   Stroke.  Risk factors: Hypertension. Diabetes mellitus. Dyslipidemia.  ------------------------------------------------------------------- Study Conclusions  - Procedure narrative: Transthoracic  echocardiography. Image   quality was adequate. The study was technically difficult, as a   result of restricted patient mobility. - Left ventricle: The cavity size was normal. Wall thickness was   increased in a pattern of moderate LVH. Systolic function was   normal. The estimated ejection fraction was in the range of 60%   to 65%. Wall motion was normal; there were no regional wall   motion abnormalities. Doppler parameters are consistent with   abnormal left ventricular relaxation (grade 1 diastolic   dysfunction). The E/e&' ratio is >15, suggesting elevated LV   filling pressure. - Aortic valve: Trileaflet. Sclerosis without stenosis. There was   no regurgitation. - Mitral valve: Calcified annulus. Mildly thickened leaflets . No   significant inflow respiratory variation. There was mild   regurgitation. - Left atrium: Severely dilated. - Atrial septum: No defect or patent foramen ovale was identified. - Systemic veins: The IVC measures >2.1 cm, but collapses >50%,   suggesting an elevated RA pressure of 8 mmHg. - Pericardium, extracardiac: Small posterior pericardial effusion.   No tamponade features.  Impressions:  - Compared to the prior study in 2017, the LVEF is higher at   60-65%. There is moderate LVH. The left atrium is severely   dilated. A small posterior pericardial effusion is noted without   tamponade features. There is diastolic dysfunction with elevated   left and right heart pressures.   EKG: Independently reviewed Vent. rate 65 BPM PR interval 168 ms QRS duration 90 ms QT/QTc 414/430 ms P-R-T axes 37 107 83 Normal sinus rhythm Rightward axis RSR' or QR pattern in V1 suggests right ventricular conduction delay Borderline ECG  Assessment/Plan Principal Problem:   Pulmonary edema Admit to inpatient/SDU. Continue supplemental oxygen. Continue hydralazine 100 mg by mouth 3 times daily. Hold every other day spironolactone. Furosemide 80 mg IVP twice  a day. Monitor intake and output. Fluid restriction 1.2 L per day. Consider repeat echo if no improvement.  Active Problems:   Acute renal failure superimposed on chronic kidney disease (HCC) Monitor BUN, creatinine and electrolytes closely. Consider nephrology evaluation in no improvement.    Essential hypertension Continue amlodipine 5 mg by mouth daily. Continue hydralazine 100 mg by mouth daily. Continue atenolol 100 mg by mouth daily in the morning. Monitor blood pressure.  PAF (paroxysmal atrial fibrillation) (HCC) CHAD2DS2 score of at least 5. Continue apixaban and atenolol.    Hyperlipidemia    Elevated troponin level Trend troponin level.    Anemia Monitor hematocrit and hemoglobin.    DVT prophylaxis: On Apixaban. Code Status: Full code. Family Communication:  Disposition Plan: Admit for IV furosemide induced diuresis, fluid restriction and supplemental oxygen. Consults called:  Admission status: Inpatient/telemetry.   Reubin Milan MD [facility] Pager [phone]  If 7PM-7AM, please contact night-coverage [email or web address] Password [facility]  11/30/2016, 11:41 PM

## 2016-12-01 ENCOUNTER — Ambulatory Visit: Payer: Medicare Other | Admitting: Family Medicine

## 2016-12-01 ENCOUNTER — Encounter (HOSPITAL_COMMUNITY): Payer: Self-pay | Admitting: General Practice

## 2016-12-01 DIAGNOSIS — N183 Chronic kidney disease, stage 3 (moderate): Secondary | ICD-10-CM

## 2016-12-01 DIAGNOSIS — J9601 Acute respiratory failure with hypoxia: Secondary | ICD-10-CM

## 2016-12-01 DIAGNOSIS — J810 Acute pulmonary edema: Secondary | ICD-10-CM

## 2016-12-01 DIAGNOSIS — I5033 Acute on chronic diastolic (congestive) heart failure: Secondary | ICD-10-CM

## 2016-12-01 DIAGNOSIS — N184 Chronic kidney disease, stage 4 (severe): Secondary | ICD-10-CM

## 2016-12-01 DIAGNOSIS — N170 Acute kidney failure with tubular necrosis: Secondary | ICD-10-CM

## 2016-12-01 DIAGNOSIS — I10 Essential (primary) hypertension: Secondary | ICD-10-CM

## 2016-12-01 LAB — BASIC METABOLIC PANEL
Anion gap: 12 (ref 5–15)
BUN: 95 mg/dL — AB (ref 6–20)
CALCIUM: 8.7 mg/dL — AB (ref 8.9–10.3)
CO2: 25 mmol/L (ref 22–32)
CREATININE: 3.95 mg/dL — AB (ref 0.44–1.00)
Chloride: 95 mmol/L — ABNORMAL LOW (ref 101–111)
GFR calc non Af Amer: 10 mL/min — ABNORMAL LOW (ref >60)
GFR, EST AFRICAN AMERICAN: 11 mL/min — AB (ref >60)
GLUCOSE: 97 mg/dL (ref 65–99)
Potassium: 3.5 mmol/L (ref 3.5–5.1)
Sodium: 132 mmol/L — ABNORMAL LOW (ref 135–145)

## 2016-12-01 LAB — CBC
HCT: 32.3 % — ABNORMAL LOW (ref 36.0–46.0)
Hemoglobin: 10.6 g/dL — ABNORMAL LOW (ref 12.0–15.0)
MCH: 29.8 pg (ref 26.0–34.0)
MCHC: 32.8 g/dL (ref 30.0–36.0)
MCV: 90.7 fL (ref 78.0–100.0)
PLATELETS: 234 10*3/uL (ref 150–400)
RBC: 3.56 MIL/uL — AB (ref 3.87–5.11)
RDW: 13.5 % (ref 11.5–15.5)
WBC: 7 10*3/uL (ref 4.0–10.5)

## 2016-12-01 LAB — MRSA PCR SCREENING: MRSA by PCR: NEGATIVE

## 2016-12-01 LAB — HEPARIN LEVEL (UNFRACTIONATED)

## 2016-12-01 LAB — TROPONIN I: TROPONIN I: 0.09 ng/mL — AB (ref <0.03)

## 2016-12-01 LAB — APTT: APTT: 49 s — AB (ref 24–36)

## 2016-12-01 MED ORDER — HYDRALAZINE HCL 50 MG PO TABS
100.0000 mg | ORAL_TABLET | Freq: Three times a day (TID) | ORAL | Status: DC
  Administered 2016-12-01 – 2016-12-07 (×20): 100 mg via ORAL
  Filled 2016-12-01 (×20): qty 2

## 2016-12-01 MED ORDER — ONE-DAILY MULTI VITAMINS PO TABS: 1.0000 | ORAL_TABLET | ORAL | Status: DC

## 2016-12-01 MED ORDER — ENSURE ENLIVE PO LIQD
237.0000 mL | Freq: Two times a day (BID) | ORAL | Status: DC
  Administered 2016-12-03 – 2016-12-06 (×5): 237 mL via ORAL

## 2016-12-01 MED ORDER — ATENOLOL 25 MG PO TABS
100.0000 mg | ORAL_TABLET | Freq: Every day | ORAL | Status: DC
  Administered 2016-12-01 – 2016-12-02 (×2): 100 mg via ORAL
  Filled 2016-12-01 (×2): qty 4

## 2016-12-01 MED ORDER — VITAMIN C 500 MG PO TABS
500.0000 mg | ORAL_TABLET | Freq: Every day | ORAL | Status: DC
  Administered 2016-12-01 – 2016-12-07 (×7): 500 mg via ORAL
  Filled 2016-12-01 (×7): qty 1

## 2016-12-01 MED ORDER — FUROSEMIDE 10 MG/ML IJ SOLN
60.0000 mg | Freq: Once | INTRAMUSCULAR | Status: AC
  Administered 2016-12-01: 60 mg via INTRAVENOUS
  Filled 2016-12-01: qty 6

## 2016-12-01 MED ORDER — IPRATROPIUM BROMIDE 0.02 % IN SOLN
0.5000 mg | Freq: Once | RESPIRATORY_TRACT | Status: AC
  Administered 2016-12-01: 0.5 mg via RESPIRATORY_TRACT
  Filled 2016-12-01: qty 2.5

## 2016-12-01 MED ORDER — LEVALBUTEROL HCL 1.25 MG/0.5ML IN NEBU
1.2500 mg | INHALATION_SOLUTION | Freq: Once | RESPIRATORY_TRACT | Status: AC
  Administered 2016-12-01: 1.25 mg via RESPIRATORY_TRACT
  Filled 2016-12-01: qty 0.5

## 2016-12-01 MED ORDER — OXYCODONE-ACETAMINOPHEN 5-325 MG PO TABS
1.0000 | ORAL_TABLET | Freq: Four times a day (QID) | ORAL | Status: DC | PRN
  Administered 2016-12-01 – 2016-12-02 (×3): 1 via ORAL
  Administered 2016-12-02: 2 via ORAL
  Administered 2016-12-03 – 2016-12-04 (×3): 1 via ORAL
  Administered 2016-12-04: 2 via ORAL
  Administered 2016-12-04: 1 via ORAL
  Administered 2016-12-05 – 2016-12-06 (×2): 2 via ORAL
  Filled 2016-12-01 (×2): qty 2
  Filled 2016-12-01 (×2): qty 1
  Filled 2016-12-01 (×2): qty 2
  Filled 2016-12-01 (×5): qty 1

## 2016-12-01 MED ORDER — AMLODIPINE BESYLATE 5 MG PO TABS
5.0000 mg | ORAL_TABLET | Freq: Two times a day (BID) | ORAL | Status: DC
  Administered 2016-12-01 – 2016-12-04 (×7): 5 mg via ORAL
  Filled 2016-12-01 (×7): qty 1

## 2016-12-01 MED ORDER — FUROSEMIDE 10 MG/ML IJ SOLN
80.0000 mg | Freq: Two times a day (BID) | INTRAMUSCULAR | Status: DC
  Administered 2016-12-01 – 2016-12-04 (×5): 80 mg via INTRAVENOUS
  Filled 2016-12-01 (×6): qty 8

## 2016-12-01 MED ORDER — APIXABAN 2.5 MG PO TABS
2.5000 mg | ORAL_TABLET | Freq: Two times a day (BID) | ORAL | Status: DC
  Administered 2016-12-01 (×2): 2.5 mg via ORAL
  Filled 2016-12-01 (×2): qty 1

## 2016-12-01 MED ORDER — HEPARIN (PORCINE) IN NACL 100-0.45 UNIT/ML-% IJ SOLN
750.0000 [IU]/h | INTRAMUSCULAR | Status: DC
  Filled 2016-12-01: qty 250

## 2016-12-01 MED ORDER — ACETAMINOPHEN 500 MG PO TABS
500.0000 mg | ORAL_TABLET | Freq: Four times a day (QID) | ORAL | Status: DC | PRN
  Administered 2016-12-01 – 2016-12-02 (×2): 500 mg via ORAL
  Filled 2016-12-01 (×2): qty 1

## 2016-12-01 MED ORDER — PRAVASTATIN SODIUM 40 MG PO TABS
40.0000 mg | ORAL_TABLET | Freq: Every evening | ORAL | Status: DC
  Administered 2016-12-01 – 2016-12-06 (×5): 40 mg via ORAL
  Filled 2016-12-01 (×5): qty 1

## 2016-12-01 MED ORDER — ORAL CARE MOUTH RINSE
15.0000 mL | Freq: Two times a day (BID) | OROMUCOSAL | Status: DC
  Administered 2016-12-01 – 2016-12-07 (×10): 15 mL via OROMUCOSAL

## 2016-12-01 MED ORDER — NEPRO/CARBSTEADY PO LIQD
237.0000 mL | Freq: Two times a day (BID) | ORAL | Status: DC
  Administered 2016-12-01: 237 mL via ORAL
  Filled 2016-12-01 (×4): qty 237

## 2016-12-01 MED ORDER — ONDANSETRON HCL 4 MG PO TABS
2.0000 mg | ORAL_TABLET | Freq: Three times a day (TID) | ORAL | Status: DC | PRN
  Administered 2016-12-02: 4 mg via ORAL
  Filled 2016-12-01: qty 1

## 2016-12-01 NOTE — Progress Notes (Signed)
ANTICOAGULATION CONSULT NOTE - INITIAL CONSULT  Pharmacy Consult:  Eliquis >> Heparin Indication: atrial fibrillation  Allergies  Allergen Reactions  . Clonidine Derivatives Palpitations and Other (See Comments)    Very sedated  . Codeine Nausea Only  . Nickel Rash  . Sulfa Antibiotics Other (See Comments)    Reaction unknown    Patient Measurements: Height: 4\' 11"  (149.9 cm) Weight: 114 lb 8 oz (51.9 kg) IBW/kg (Calculated) : 43.2 Heparin Dosing Weight: 52 kg  Vital Signs: Temp: 97.5 F (36.4 C) (07/17 0756) Temp Source: Oral (07/17 0756) BP: 168/75 (07/17 0756) Pulse Rate: 71 (07/17 0756)  Labs:  Recent Labs  11/30/16 1331 11/30/16 2033 12/01/16 0229  HGB 11.3*  --  10.6*  HCT 34.5*  --  32.3*  PLT 246  --  234  CREATININE 4.21*  --  3.95*  TROPONINI 0.11* 0.09* 0.09*    Estimated Creatinine Clearance: 8.2 mL/min (A) (by C-G formula based on SCr of 3.95 mg/dL (H)).    Assessment: 15 YOF presented with weakness and SOB.  Patient has a history of Afib on Eliquis PTA.  Pharmacy consulted to transition patient to IV heparin due to AKI.  Patient las took Eliquis this AM around 0830.  No bleeding reported.   Goal of Therapy:  Heparin level 0.3-0.7 units/ml aPTT 66 - 102 seconds Monitor platelets by anticoagulation protocol: Yes    Plan:  Check baseline aPTT and heparin level prior to starting heparin At 2030, start heparin gtt at 750 units/hr Check 8 hr aPTT Daily heparin level, aPTT and CBC Consider resuming home med atenolol   Aundra Espin D. Mina Marble, PharmD, BCPS Pager:  [phone] 12/01/2016, 10:51 AM

## 2016-12-01 NOTE — Consult Note (Signed)
Cardiology Consultation:   [name]; [id number]; [birth year]   Admit date: 11/30/2016 Date of Consult: 12/01/2016  Primary Care Provider: Tammi Sou, MD Primary Cardiologist: Dr. Quay Burow Primary Electrophysiologist:  None   Patient Profile:   Valerie Terrell is a 83 y.o. female with a hx of atrophy of the left kidney (absent blood flow by renal artery doppler, Dr. Gwenlyn Terrell), hx of subarachnoid hemorrhage,  Hypertension,  CKD III, hyperlipidemia, PAF on low dose Eliquis,  Pulmonary edema.  The patient is being seen today for the evaluation of chest pain at the request of Dr. Bonner Terrell.  History of Present Illness:   Valerie Terrell See's dr. Adora Terrell. Last seen in the office on 09/23/2016 for follow-up of her atrial fibrillation, hypertension, dyslipidemia. She was noted to be hypertensive at this office visit but that was not the norm for here therefore amlodipine, atenolol,  hydralazine and close monitoring recommended.  She has a history of a nonfunctioning left kidney with mild right renal artery stenosis demonstrated by duplex ultrasound October 2017.  She presented to the ER yesterday for progressively worsening weakness for the past two weeks; markedly worse over the weekend. She had associated nausea, palpitations and dizziness. In the ER she was noted to be in no distress, afebrile, pulse 63, BP 155/67, respirations 22, O2 sat 93% on room air.   WBC 7.0, hgb 11.3, platelets 246, Troponin 0.11, BNP > 4,500, NA 130, K 3.9, creatinine 4.21 (was 2.4 a week ago). Chest xray positive for interstitial and mild aveolar edema with left pleural effusion. Aortic atherosclerosis with calcification. He has been receiving furosemide 70m IV bd. Fluid restriction.  Today around 1:00pm, the patient developed chest pain after getting out of bed to void on the bed side commode and getting back into bed. She described it as a discomfort when she would take a deep breath. 4/10 EKG  done and showed NSR, HR 60s. Has not had further episodes.  Weight at her most recent office visit was 111 and today is 114. She feels like the fluid in her legs is mildly improved from yesterday.  Past Medical History:  Diagnosis Date  . Atrophy of left kidney    with absent blood flow by renal artery dopplers (Dr. BGwenlyn Terrell  . 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 Chronic renal insufficiency, stage 4 (severe) (HCC)    Baseline GFR 25 ml/min-- renal u/s showed atrophic/hypoplastic left kidney.  No hydronephrosis.  Small right renal cyst.  . History of adenomatous polyp of colon   . History of subarachnoid hemorrhage 10/2014   after syncope and while on xarelto  . Hyperlipidemia   . Hypertension    Difficult to control, in the setting of one functioning kidney: pt was referred to nephrology by Dr. BGwenlyn Found2/2017.  . Lumbar radiculopathy 2012  . Lumbar spondylosis    MR 07/2016---no sign of spinal nerve compression or cord compression.  Pt set up with outpt ortho while admitted to hosp 07/2016.  . Metatarsal fracture 06/10/2016   Nondisplaced, left 5th metatarsal--pt was referred to ortho  . Osteopenia 2014   T-score -2.1  . PAF (paroxysmal atrial fibrillation) (HCC)    Eliquis started after BRAO and CVA  . Pulmonary edema 11/2016  . Stroke ([facility]    cardioembolic (had CVA while on no anticoag)--"scattered subacute punctate infarcts: 1 in R parietal lobe and 2 in occipital cortex" on MRI br.  CT angio head/neck: aortic arch athero.   .[name]  Thoracic compression fracture ([place]) 07/2016   T7 and T8-- T8 kyphoplasty during hosp admission 07/2016.  Neuro referred pt to pain mgmt for consideration of injection 09/2016.  I referred her to endo 08/2016 for consideration of calcitonin treatment.    Past Surgical History:  Procedure Laterality Date  . APPENDECTOMY  child  . CARDIOVASCULAR STRESS TEST  01/31/2016   Stress myoview: NORMAL/Low risk.  EF 56%.  . [place]  .  COLONOSCOPY  2015   + hx of adenomatous polyps.  Need digest health spec in [place] records to see when pt due for next colonoscopy  . IR GENERIC HISTORICAL  07/24/2016   IR KYPHO THORACIC WITH BONE BIOPSY 07/24/2016 Luanne Bras, MD [facility]-INTERV RAD  . KYPHOPLASTY  07/27/2016   T8  . Renal artery dopplers  02/26/2016   Her right renal dimension was 11 cm pole to pole with mild to moderate right renal artery stenosis. A right renal aortic ratio was 3.22 suggesting less than a 50% stenosis.  . TONSILLECTOMY    . TRANSTHORACIC ECHOCARDIOGRAM  01/29/2016   EF 55-60%, normal LV wall motion, grade I DD.  No cardiac source of emboli was seen.     Inpatient Medications: Scheduled Meds: . amLODipine  5 mg Oral BID WC  . atenolol  100 mg Oral Daily  . feeding supplement (NEPRO CARB STEADY)  237 mL Oral BID BM  . furosemide  80 mg Intravenous BID  . hydrALAZINE  100 mg Oral TID  . mouth rinse  15 mL Mouth Rinse BID  . pravastatin  40 mg Oral QPM  . vitamin C  500 mg Oral Daily   Continuous Infusions: . heparin     PRN Meds: acetaminophen, ondansetron, oxyCODONE-acetaminophen  Allergies:    Allergies  Allergen Reactions  . Clonidine Derivatives Palpitations and Other (See Comments)    Very sedated  . Codeine Nausea Only  . Nickel Rash  . Sulfa Antibiotics Other (See Comments)    Reaction unknown    Social History:   Social History   Social History  . Marital status: Widowed    Spouse name: N/A  . Number of children: N/A  . Years of education: N/A   Occupational History  . Not on file.   Social History Main Topics  . Smoking status: Never Smoker  . Smokeless tobacco: Never Used  . Alcohol use 0.6 oz/week    1 Glasses of wine per week     Comment: wine  . Drug use: No  . Sexual activity: No   Other Topics Concern  . Not on file   Social History Narrative   Widow.  One daughter, lives with her.   Educ: college   Occup: retired Marine scientist.   No T/A/Ds.   She is almost  a vegetarian.      Family History:   The patient's family history includes Cancer in her mother; Early death in her father; Heart disease in her father. There is no history of Sudden Cardiac Death.  ROS:  Please see the history of present illness.  All other ROS reviewed and negative.     Physical Exam/Data:   Vitals:   12/01/16 0756 12/01/16 1138 12/01/16 1257 12/01/16 1301  BP: (!) 168/75 (!) 150/64  (!) 171/71  Pulse: 71  77 73  Resp: 19   (!) 23  Temp: (!) 97.5 F (36.4 C)   98 F (36.7 C)  TempSrc: Oral   Oral  SpO2: 92%  92%  Weight:      Height:        Intake/Output Summary (Last 24 hours) at 12/01/16 1542 Last data filed at 12/01/16 1530  Gross per 24 hour  Intake              240 ml  Output              800 ml  Net             -560 ml   Filed Weights   11/30/16 2230 12/01/16 0500  Weight: 114 lb 14.4 oz (52.1 kg) 114 lb 8 oz (51.9 kg)   Body mass index is 23.13 kg/m.  General: Well developed, frail, in no acute distress. Head: Normocephalic, atraumatic, Neck: Negative for carotid bruits. JVP is elevated Lungs: No wheezes, rales, or rhonchi. She is on nasal cannula, crackles to the right lung basis. Heart: RRR with S1 S2. No murmurs, rubs, or gallops appreciated. Abdomen: Soft, non-tender, non-distended with normoactive bowel sounds. Msk:  Strength and tone appear normal for age. Extremities: No clubbing or cyanosis. 2+ pitting edema. Neuro: Alert and oriented X 3. No facial asymmetry. Psych:  Responds to questions appropriately with a normal affect.  EKG:  The EKG was personally reviewed and demonstrates NSR, HR 60s  Relevant CV Studies:  Echocardiogram pending for this admission  Echocardiogram 07/14/2016  Study Conclusions  - Procedure narrative: Transthoracic echocardiography. Image   quality was adequate. The study was technically difficult, as a   result of restricted patient mobility. - Left ventricle: The cavity size was normal. Wall  thickness was   increased in a pattern of moderate LVH. Systolic function was   normal. The estimated ejection fraction was in the range of 60%   to 65%. Wall motion was normal; there were no regional wall   motion abnormalities. Doppler parameters are consistent with   abnormal left ventricular relaxation (grade 1 diastolic   dysfunction). The E/e&' ratio is >15, suggesting elevated LV   filling pressure. - Aortic valve: Trileaflet. Sclerosis without stenosis. There was   no regurgitation. - Mitral valve: Calcified annulus. Mildly thickened leaflets . No   significant inflow respiratory variation. There was mild   regurgitation. - Left atrium: Severely dilated. - Atrial septum: No defect or patent foramen ovale was identified. - Systemic veins: The IVC measures >2.1 cm, but collapses >50%,   suggesting an elevated RA pressure of 8 mmHg. - Pericardium, extracardiac: Small posterior pericardial effusion.   No tamponade features.  Impressions:  - Compared to the prior study in 2017, the LVEF is higher at   60-65%. There is moderate LVH. The left atrium is severely   dilated. A small posterior pericardial effusion is noted without   tamponade features. There is diastolic dysfunction with elevated   left and right heart pressures.  Nuclear Stress Test 01/31/2016  IMPRESSION: 1. No reversible ischemia or infarction.  2. Normal left ventricular wall motion.  3. Left ventricular ejection fraction 56%  4. Non invasive risk stratification*: Low     Laboratory Data:  Chemistry Recent Labs Lab 11/30/16 1331 12/01/16 0229  NA 130* 132*  K 3.9 3.5  CL 92* 95*  CO2 24 25  GLUCOSE 123* 97  BUN 93* 95*  CREATININE 4.21* 3.95*  CALCIUM 9.1 8.7*  GFRNONAA 9* 10*  GFRAA 10* 11*  ANIONGAP 14 12    Hematology Recent Labs Lab 11/30/16 1331 12/01/16 0229  WBC 7.0 7.0  RBC 3.81* 3.56*  HGB 11.3* 10.6*  HCT 34.5* 32.3*  MCV 90.6 90.7  MCH 29.7 29.8  MCHC 32.8 32.8    RDW 13.6 13.5  PLT 246 234   Cardiac Enzymes Recent Labs Lab 11/30/16 1331 11/30/16 2033 12/01/16 0229  TROPONINI 0.11* 0.09* 0.09*   No results for input(s): TROPIPOC in the last 168 hours.  BNP Recent Labs Lab 11/30/16 1331  BNP >4,500.0*     Radiology/Studies:  Dg Chest 2 View  Result Date: 11/30/2016 CLINICAL DATA:  83 y/o  F; shortness of breath. EXAM: CHEST  2 VIEW COMPARISON:  08/05/2016 chest radiograph FINDINGS: Stable moderate cardiomegaly. Aortic atherosclerosis with calcification. Stable small left pleural effusion. Stable left lung base opacity. Interstitial pulmonary edema. Increased perihilar haziness probably represents developing alveolar edema. Midthoracic compression deformity post kyphoplasty. IMPRESSION: Interstitial and mild alveolar pulmonary edema. Small left pleural effusion with left basilar opacity probably representing associated atelectasis. Stable cardiomegaly. Electronically Signed   By: Kristine Garbe M.D.   On: 11/30/2016 17:57    Assessment and Plan:   1. Acute on chronic diastolic heart failure: BNP this admission is > 4,500 which is significantly elevated from her baseline 800-900, she has one functioning kidney and its function is worsening. She takes 80 mg PO lasix daily as well as  Spironolactone every other day at home. Weight is still up 3 lbs and total output this admission is ~550. --She is currently receiving 80 mg BID IV lasix in hospital, would continue at this dose for now and watch creatinine very closely.  ---She may benefit from having nephrology on board to help manage kidney disease since they are already familiar with her and will likely require high doses of lasix to diurese  2. Chest pain: Ms Henderson describes her pain as across her chest and as an intermittent stabbing sensation with shortness of breath that is exacerbated with exertion.She had a low risk nuclear stress test in September of 2017. Her troponins are  mildly elevated this admission but are trending flat. This is consistent with demand likely due to her acute renal injury and heart failure exacerbation.  We will follow-up on echo results.  3. Paroxysmal atrial fibrillation: pt currently in sinus rhythm, cont low dose Eliquis  Signed, Jena Gauss  12/01/2016 3:42 PM   Agree with note by Delos Haring PA-C  Patient well the office. Recently saw HER-2 months ago. She does have a history of normal LV function, PAF, diastolic heart failure and moderate Renal insufficiency. She had a Negative Myoview stresstest in September of last year. When I saw her In May she was clinically stable. Over the last several weeks she's had progressive lower extremity edema with decreasing effectiveness of Her diuretics which consisted of spironolactone alternating with furosemide. She was admitted with heart failure with a BNP of 4500, above her baseline of 8-900. She did have interstitial edema on chest x-ray and lower extremity edema as well. She is minimally responding to IV diuretics. Her serum creatinine was in the 4 range and is Slowly decreasing. Agree with obtaining assistance from the nephrology service who knew her well. She does ha one functioning kidney.We will check a 2-D echocardiogram. Happy to follow along with you.   Lorretta Harp, M.D., [place], [facility], [place]verta [place] [address] [place], [place]  [address]  [phone] 12/02/2016 7:13 AM

## 2016-12-01 NOTE — Discharge Instructions (Addendum)
Please see your primary care physician in 3 days to have a hospital follow up visit and to check PT/INR and adjust warfarin doses.   Home Health nurse to check PT/INR in 2 days and report results to PCP or cardiologist.      Supplemental Discharge Instructions for  Pacemaker/Defibrillator Patients  Activity No heavy lifting or vigorous activity with your left/right arm for 6 to 8 weeks.  Do not raise your left/right arm above your head for one week.  Gradually raise your affected arm as drawn below.              12/07/16                   12/08/16                     12/09/16                  12/10/16 __  NO DRIVING for  1 week   ; you may begin driving on  5/00/93   .  WOUND CARE - Keep the wound area clean and dry.  Do not get this area wet for one week. No showers for one week; you may shower on 12/10/16  . - The tape/steri-strips on your wound will fall off; do not pull them off.  No bandage is needed on the site.  DO  NOT apply any creams, oils, or ointments to the wound area. - If you notice any drainage or discharge from the wound, any swelling or bruising at the site, or you develop a fever > 101? F after you are discharged home, call the office at once.  Special Instructions - You are still able to use cellular telephones; use the ear opposite the side where you have your pacemaker/defibrillator.  Avoid carrying your cellular phone near your device. - When traveling through airports, show security personnel your identification card to avoid being screened in the metal detectors.  Ask the security personnel to use the hand wand. - Avoid arc welding equipment, MRI testing (magnetic resonance imaging), TENS units (transcutaneous nerve stimulators).  Call the office for questions about other devices. - Avoid electrical appliances that are in poor condition or are not properly grounded. - Microwave ovens are safe to be near or to operate.  Additional information for defibrillator  patients should your device go off: - If your device goes off ONCE and you feel fine afterward, notify the device clinic nurses. - If your device goes off ONCE and you do not feel well afterward, call 911. - If your device goes off TWICE, call 911. - If your device goes off THREE times in one day, call 911.  DO NOT DRIVE YOURSELF OR A FAMILY MEMBER WITH A DEFIBRILLATOR TO THE HOSPITAL--CALL 911.   Information on my medicine - ELIQUIS (apixaban)  This medication education was reviewed with me or my healthcare representative as part of my discharge preparation.  The pharmacist that spoke with me during my hospital stay was:  Saundra Shelling, [facility]  Why was Eliquis prescribed for you? Eliquis was prescribed for you to reduce the risk of a blood clot forming that can cause a stroke if you have a medical condition called atrial fibrillation (a type of irregular heartbeat).  What do You need to know about Eliquis ? Take your Eliquis TWICE DAILY - one tablet in the morning and one tablet in the evening with or without food.  If you have difficulty swallowing the tablet whole please discuss with your pharmacist how to take the medication safely.  Take Eliquis exactly as prescribed by your doctor and DO NOT stop taking Eliquis without talking to the doctor who prescribed the medication.  Stopping may increase your risk of developing a stroke.  Refill your prescription before you run out.  After discharge, you should have regular check-up appointments with your healthcare provider that is prescribing your Eliquis.  In the future your dose may need to be changed if your kidney function or weight changes by a significant amount or as you get older.  What do you do if you miss a dose? If you miss a dose, take it as soon as you remember on the same day and resume taking twice daily.  Do not take more than one dose of ELIQUIS at the same time to make up a missed dose.  Important Safety  Information A possible side effect of Eliquis is bleeding. You should call your healthcare provider right away if you experience any of the following: ? Bleeding from an injury or your nose that does not stop. ? Unusual colored urine (red or dark brown) or unusual colored stools (red or black). ? Unusual bruising for unknown reasons. ? A serious fall or if you hit your head (even if there is no bleeding).  Some medicines may interact with Eliquis and might increase your risk of bleeding or clotting while on Eliquis. To help avoid this, consult your healthcare provider or pharmacist prior to using any new prescription or non-prescription medications, including herbals, vitamins, non-steroidal anti-inflammatory drugs (NSAIDs) and supplements.  This website has more information on Eliquis (apixaban): http://www.eliquis.com/eliquis/home   Please see your primary care physician in 3 days to have a hospital follow up visit and to check PT/INR and adjust warfarin doses.      Follow with Primary MD  McGowen, Adrian Blackwater, MD  and other consultant's as instructed your Hospitalist MD  Please get a complete blood count and chemistry panel checked by your Primary MD at your next visit, and again as instructed by your Primary MD.  Get Medicines reviewed and adjusted: Please take all your medications with you for your next visit with your Primary MD  Laboratory/radiological data: Please request your Primary MD to go over all hospital tests and procedure/radiological results at the follow up, please ask your Primary MD to get all Hospital records sent to his/her office.  In some cases, they will be blood work, cultures and biopsy results pending at the time of your discharge. Please request that your primary care M.D. follows up on these results.  Also Note the following: If you experience worsening of your admission symptoms, develop shortness of breath, life threatening emergency, suicidal or  homicidal thoughts you must seek medical attention immediately by calling 911 or calling your MD immediately  if symptoms less severe.  You must read complete instructions/literature along with all the possible adverse reactions/side effects for all the Medicines you take and that have been prescribed to you. Take any new Medicines after you have completely understood and accpet all the possible adverse reactions/side effects.   Do not drive when taking Pain medications or sleeping medications (Benzodaizepines)  Do not take more than prescribed Pain, Sleep and Anxiety Medications. It is not advisable to combine anxiety,sleep and pain medications without talking with your primary care practitioner  Special Instructions: If you have smoked or chewed Tobacco  in the last 2  yrs please stop smoking, stop any regular Alcohol  and or any Recreational drug use.  Wear Seat belts while driving.  Please note: You were cared for by a hospitalist during your hospital stay. Once you are discharged, your primary care physician will handle any further medical issues. Please note that NO REFILLS for any discharge medications will be authorized once you are discharged, as it is imperative that you return to your primary care physician (or establish a relationship with a primary care physician if you do not have one) for your post hospital discharge needs so that they can reassess your need for medications and monitor your lab values.

## 2016-12-01 NOTE — Plan of Care (Signed)
Problem: Safety: Goal: Ability to remain free from injury will improve Outcome: Progressing Fall risk bundle in place. Bed alarm on. Pt is a high fall risk with history of a recent fall within the last week and has an approximate 2 inch x 2 inch bruise on the upper thoracic area. No falls, injuries or skin break down this shift. Will continue to monitor and assess.   Problem: Tissue Perfusion: Goal: Risk factors for ineffective tissue perfusion will decrease Outcome: Progressing Pt complaining of increased sob. Audible wheezing and crackles present. Dr Olevia Bowens notified and order for 2 breathing treatments and lasix obtained (see MAR) . Pt states that the breathing treatment helped and O2 currently at 96% on 3L [place].  Will continue to monitor and assess pt.

## 2016-12-01 NOTE — Progress Notes (Signed)
PROGRESS NOTE  Valerie Terrell  QVZ:[id number] DOB: Mar 30, 1933 DOA: 11/30/2016 PCP: Tammi Sou, MD   Brief Narrative: Valerie Terrell is a 83 y.o. female with medical history significant of left kidney atrophy, retinal artery occlusion in the left eye, stage III chronic renal insufficiency, history of subarachnoid hemorrhage, hyperlipidemia, hypertension, lumbar spondylosis, paroxysmal atrial fibrillation on apixaban who presented to the ED 7/16 with progressively worse dyspnea, lower extremity edema, orthopnea and weakness for the past 2 weeks, but particularly worse in over the weekend. She has also had mild nausea, occasional palpitations and dizziness. Initial vital signs in the emergency department temperature 97.66F, pulse 63, blood pressure 155/67 mmHg, respirations 22 and O2 sat 93% on room air. Her WBC is 7.0, hemoglobin 11.3 g/dL and platelets 246. Her troponin level was 0.11, BNP over 4500, sodium 130, potassium 3.9, chloride 92, bicarbonate 24 mmol/L. Her BUN was 93, creatinine 4.21 up from 2.4 a week ago. Her chest radiograph shows interstitial and mild alveolar edema with left pleural effusion.  IV lasix was administered and the patient was admitted for ongoing diuresis.   Assessment & Plan: Principal Problem:   Pulmonary edema Active Problems:   PAF (paroxysmal atrial fibrillation) (HCC)   Essential hypertension   Acute renal failure superimposed on chronic kidney disease (HCC)   Hyperlipidemia   Elevated troponin level   Anemia  Acute hypoxic respiratory failure due to pulmonary edema: Due to volume overload from acute renal failure. BNP >4500.  - Continue supplemental oxygen prn - Will repeat echo with elevated troponin and uncertain precipitant of overload. Last echo showed diastolic dysfunction, EF 32-95%, with elevated left and right heart pressures.  - Diurese as below  Acute renal failure superimposed on stage III CKD: Follows with Dr. Florene Glen as outpatient. SCr  2.51 at recent discharge for similar presentation, up to 4.21 on admission. Uncertain cause, though she may not be taking diuretics as directed.  - Lasix 80mg  IV BID, holding spironolactone for now. - Monitor BMP and UOP. - I've asked for nephrology input, to see 7/18.   Elevated troponin: Flat trend, likely demand ischemia with decreased clearance, though pt is having chest pain. ECG appears nonischemic.  - Will consult cardiology for consideration of ischemic work up.  Essential hypertension - Continue amlodipine (5mg  BID), hydralazine 100mg  TID, atenolol 100mg . Chlorthalidone stopped at last admission in March 2018. - Above goal, titrate as indicated with diuresis.   PAF:  - CHA2DS2-VASc Scoreat least 5.  - Continue atenolol, currently NSR - On IV heparin during acute renal failure, restart apixiban when indicated.   Hyperlipidemia: Chronic, stable.  - Continue statin  Anemia of chronic disease:  - No evidence of active bleeding, monitor CBC  DVT prophylaxis: Heparin IV Code Status: Full Family Communication: None at bedside  Disposition Plan: Anticipate DC to home once pulmonary edema resolved  Consultants:   Cardiology  Nephrology to start 7/18  Procedures:   None  Antimicrobials:  None   Subjective: Pt unchanged from admission. Confirms history as above. Had chest pain with deep breaths this afternoon while exerting herself to/from [facility].  Objective: Vitals:   12/01/16 0756 12/01/16 1138 12/01/16 1257 12/01/16 1301  BP: (!) 168/75 (!) 150/64  (!) 171/71  Pulse: 71  77 73  Resp: 19   (!) 23  Temp: (!) 97.5 F (36.4 C)   98 F (36.7 C)  TempSrc: Oral   Oral  SpO2: 92%   92%  Weight:      Height:  Intake/Output Summary (Last 24 hours) at 12/01/16 1423 Last data filed at 12/01/16 1302  Gross per 24 hour  Intake              240 ml  Output              600 ml  Net             -360 ml   Filed Weights   11/30/16 2230 12/01/16 0500  Weight:  52.1 kg (114 lb 14.4 oz) 51.9 kg (114 lb 8 oz)   General exam: 83 y.o. female in no distress Respiratory system: Tachypneic on 3L by [place] without accessory muscle use. Crackles noted bilaterally Cardiovascular system: Regular rate and rhythm. No murmur, rub, or gallop. No JVD, and 2+ pitting LE edema. Gastrointestinal system: Abdomen soft, non-tender, non-distended, with normoactive bowel sounds. No organomegaly or masses felt. Central nervous system: Alert and oriented. No focal neurological deficits. Extremities: Warm, no deformities Skin: No rashes, lesions no ulcers Psychiatry: Judgement and insight appear normal. Mood & affect appropriate.   Data Reviewed: I have personally reviewed following labs and imaging studies  CBC:  Recent Labs Lab 11/30/16 1331 12/01/16 0229  WBC 7.0 7.0  HGB 11.3* 10.6*  HCT 34.5* 32.3*  MCV 90.6 90.7  PLT 246 830   Basic Metabolic Panel:  Recent Labs Lab 11/30/16 1331 12/01/16 0229  NA 130* 132*  K 3.9 3.5  CL 92* 95*  CO2 24 25  GLUCOSE 123* 97  BUN 93* 95*  CREATININE 4.21* 3.95*  CALCIUM 9.1 8.7*   GFR: Estimated Creatinine Clearance: 8.2 mL/min (A) (by C-G formula based on SCr of 3.95 mg/dL (H)).  Cardiac Enzymes:  Recent Labs Lab 11/30/16 1331 11/30/16 2033 12/01/16 0229  TROPONINI 0.11* 0.09* 0.09*   CBG:  Recent Labs Lab 11/30/16 1617  GLUCAP 125*   Urine analysis:    Component Value Date/Time   COLORURINE YELLOW 11/30/2016 1610   APPEARANCEUR CLEAR 11/30/2016 1610   LABSPEC 1.013 11/30/2016 1610   PHURINE 5.0 11/30/2016 1610   GLUCOSEU NEGATIVE 11/30/2016 1610   HGBUR NEGATIVE 11/30/2016 1610   BILIRUBINUR NEGATIVE 11/30/2016 1610   BILIRUBINUR negative 09/24/2016 [place] 11/30/2016 1610   PROTEINUR 30 (A) 11/30/2016 1610   UROBILINOGEN 0.2 09/24/2016 1453   NITRITE NEGATIVE 11/30/2016 1610   LEUKOCYTESUR SMALL (A) 11/30/2016 1610   Radiology Studies: Dg Chest 2 View  Result Date:  11/30/2016 CLINICAL DATA:  83 y/o  F; shortness of breath. EXAM: CHEST  2 VIEW COMPARISON:  08/05/2016 chest radiograph FINDINGS: Stable moderate cardiomegaly. Aortic atherosclerosis with calcification. Stable small left pleural effusion. Stable left lung base opacity. Interstitial pulmonary edema. Increased perihilar haziness probably represents developing alveolar edema. Midthoracic compression deformity post kyphoplasty. IMPRESSION: Interstitial and mild alveolar pulmonary edema. Small left pleural effusion with left basilar opacity probably representing associated atelectasis. Stable cardiomegaly. Electronically Signed   By: Kristine Garbe M.D.   On: 11/30/2016 17:57    LOS: 1 day   Time spent: 25 minutes.  Vance Gather, MD [facility] Pager [phone]  If 7PM-7AM, please contact night-coverage [email or web address] Password [facility] 12/01/2016, 2:23 PM

## 2016-12-01 NOTE — Progress Notes (Signed)
Initial Nutrition Assessment  DOCUMENTATION CODES:   Severe malnutrition in context of chronic illness  INTERVENTION:    Ensure Enlive po BID, each supplement provides 350 kcal and 20 grams of protein  NUTRITION DIAGNOSIS:   Malnutrition (severe) related to chronic illness (CRI, compression fx, lumbar spondylosis, s/p kyphoplasty 4 months ago) as evidenced by severe depletion of muscle mass, severe depletion of body fat, severe fluid accumulation.  GOAL:   Patient will meet greater than or equal to 90% of their needs  MONITOR:   PO intake, Supplement acceptance, Labs, Weight trends, I & O's  REASON FOR ASSESSMENT:   Malnutrition Screening Tool    ASSESSMENT:   83 yo female with PMH of HTN, HLD, L kidney atrophy, PAF, SAH, osteopenia, CRI stage 4, thoracic compression fracture and lumbar spondylosis s/p kyphoplasty 07/27/16. She was admitted on 7/16 with LE edema, orthopnea, and weakness r/t pulmonary edema.  Patient reports that she has been eating poorly recently. She c/o poor appetite and not feeling well. She endorses 45 lb weight loss recently. She tried to drink a Nepro supplement, but thinks it is too rich. She prefers chocolate Ensure.   Nutrition-Focused physical exam completed. Findings are severe fat depletion, severe muscle depletion, and severe edema. Patient with 25% weight loss within the past 4 months. Meets criteria for severe PCM in the context of chronic illness.  Labs reviewed: sodium 132 (L) Medications reviewed and include Lasix and vitamin C.  Diet Order:  Diet 2 gram sodium Room service appropriate? Yes; Fluid consistency: Thin; Fluid restriction: 1200 mL Fluid  Skin:  Reviewed, no issues  Last BM:  7/15  Height:   Ht Readings from Last 1 Encounters:  11/30/16 4\' 11"  (1.499 m)    Weight:   Wt Readings from Last 1 Encounters:  12/01/16 114 lb 8 oz (51.9 kg)    Ideal Body Weight:  44.7 kg  BMI:  Body mass index is 23.13  kg/m.  Estimated Nutritional Needs:   Kcal:  1400-1600  Protein:  70-80 gm  Fluid:  1.4 L  EDUCATION NEEDS:   No education needs identified at this time  Molli Barrows, [place], [place], [place] Pager [phone] After Hours Pager [phone]

## 2016-12-01 NOTE — Progress Notes (Signed)
Pharmacy note: heparin  RN called to report that patient stated she has experienced a nose bleed today and a small amount of blood from the rectum.  Patient is noted with AKI and a solitary kidney with the last apixiban dose this morning. There may be risk of accumulation.  -Spoke to K. Schorr NP and will hold heparin for now.  Plan -Hold heparin and reassess in am -Follow for any further episodes of bleeding -CBC in am  Hildred Laser, Pharm D 12/01/2016 10:27 PM

## 2016-12-01 NOTE — Progress Notes (Signed)
The patient had chest pain mid sternum after getting out of bed, voiding on BSC, and getting back to bed. Pain is described as "discomfort when taking a deep breath." 0 out of 10 pain scale is a 4. EKG was taken, tylenol given, oxygen on 3l nasal cannula with stats ~94. Notified Grunz. No new orders at this time. I will continue to monitor the patient closely.   Saddie Benders RN

## 2016-12-02 ENCOUNTER — Inpatient Hospital Stay (HOSPITAL_COMMUNITY): Payer: Medicare Other

## 2016-12-02 DIAGNOSIS — E785 Hyperlipidemia, unspecified: Secondary | ICD-10-CM

## 2016-12-02 DIAGNOSIS — I480 Paroxysmal atrial fibrillation: Secondary | ICD-10-CM

## 2016-12-02 DIAGNOSIS — I495 Sick sinus syndrome: Secondary | ICD-10-CM

## 2016-12-02 DIAGNOSIS — J9621 Acute and chronic respiratory failure with hypoxia: Secondary | ICD-10-CM | POA: Diagnosis present

## 2016-12-02 DIAGNOSIS — I340 Nonrheumatic mitral (valve) insufficiency: Secondary | ICD-10-CM

## 2016-12-02 LAB — CBC
HCT: 32.5 % — ABNORMAL LOW (ref 36.0–46.0)
HEMOGLOBIN: 10.7 g/dL — AB (ref 12.0–15.0)
MCH: 30.2 pg (ref 26.0–34.0)
MCHC: 32.9 g/dL (ref 30.0–36.0)
MCV: 91.8 fL (ref 78.0–100.0)
PLATELETS: 247 10*3/uL (ref 150–400)
RBC: 3.54 MIL/uL — AB (ref 3.87–5.11)
RDW: 13.8 % (ref 11.5–15.5)
WBC: 7.1 10*3/uL (ref 4.0–10.5)

## 2016-12-02 LAB — BASIC METABOLIC PANEL
ANION GAP: 12 (ref 5–15)
BUN: 91 mg/dL — ABNORMAL HIGH (ref 6–20)
CALCIUM: 8.3 mg/dL — AB (ref 8.9–10.3)
CO2: 27 mmol/L (ref 22–32)
CREATININE: 3.23 mg/dL — AB (ref 0.44–1.00)
Chloride: 95 mmol/L — ABNORMAL LOW (ref 101–111)
GFR calc non Af Amer: 12 mL/min — ABNORMAL LOW (ref >60)
GFR, EST AFRICAN AMERICAN: 14 mL/min — AB (ref >60)
Glucose, Bld: 114 mg/dL — ABNORMAL HIGH (ref 65–99)
Potassium: 3.2 mmol/L — ABNORMAL LOW (ref 3.5–5.1)
SODIUM: 134 mmol/L — AB (ref 135–145)

## 2016-12-02 LAB — HEPARIN LEVEL (UNFRACTIONATED): HEPARIN UNFRACTIONATED: 1.78 [IU]/mL — AB (ref 0.30–0.70)

## 2016-12-02 LAB — APTT: aPTT: 36 seconds (ref 24–36)

## 2016-12-02 MED ORDER — CEFAZOLIN SODIUM-DEXTROSE 2-4 GM/100ML-% IV SOLN
2.0000 g | INTRAVENOUS | Status: AC
  Administered 2016-12-03: 2 g via INTRAVENOUS
  Filled 2016-12-02: qty 100

## 2016-12-02 MED ORDER — SODIUM CHLORIDE 0.9 % IR SOLN
80.0000 mg | Status: AC
  Administered 2016-12-03: 80 mg
  Filled 2016-12-02: qty 2

## 2016-12-02 MED ORDER — CHLORHEXIDINE GLUCONATE 4 % EX LIQD
60.0000 mL | Freq: Once | CUTANEOUS | Status: AC
  Administered 2016-12-02: 4 via TOPICAL
  Filled 2016-12-02: qty 60

## 2016-12-02 MED ORDER — MORPHINE SULFATE (PF) 2 MG/ML IV SOLN
1.0000 mg | INTRAVENOUS | Status: DC | PRN
  Administered 2016-12-02: 1 mg via INTRAVENOUS
  Filled 2016-12-02: qty 1

## 2016-12-02 MED ORDER — CHLORHEXIDINE GLUCONATE 4 % EX LIQD
60.0000 mL | Freq: Once | CUTANEOUS | Status: AC
  Administered 2016-12-03: 4 via TOPICAL

## 2016-12-02 MED ORDER — PHENOL 1.4 % MT LIQD: 1.0000 | OROMUCOSAL | Status: DC | PRN

## 2016-12-02 MED ORDER — POTASSIUM CHLORIDE CRYS ER 20 MEQ PO TBCR: 40.0000 meq | EXTENDED_RELEASE_TABLET | Freq: Once | ORAL | Status: DC

## 2016-12-02 MED ORDER — SODIUM CHLORIDE 0.9 % IV SOLN
INTRAVENOUS | Status: DC
  Administered 2016-12-02: 16:00:00 via INTRAVENOUS

## 2016-12-02 MED ORDER — POTASSIUM CHLORIDE CRYS ER 20 MEQ PO TBCR: 40.0000 meq | EXTENDED_RELEASE_TABLET | Freq: Every day | ORAL | Status: DC

## 2016-12-02 MED ORDER — POTASSIUM CHLORIDE CRYS ER 20 MEQ PO TBCR
40.0000 meq | EXTENDED_RELEASE_TABLET | Freq: Every day | ORAL | Status: DC
  Administered 2016-12-02: 40 meq via ORAL
  Filled 2016-12-02: qty 2

## 2016-12-02 MED ORDER — HEPARIN (PORCINE) IN NACL 100-0.45 UNIT/ML-% IJ SOLN
750.0000 [IU]/h | INTRAMUSCULAR | Status: DC
  Administered 2016-12-02: 750 [IU]/h via INTRAVENOUS
  Filled 2016-12-02: qty 250

## 2016-12-02 MED ORDER — POTASSIUM CHLORIDE 20 MEQ/15ML (10%) PO SOLN
40.0000 meq | Freq: Every day | ORAL | Status: DC
  Administered 2016-12-02 – 2016-12-03 (×2): 40 meq via ORAL
  Filled 2016-12-02 (×3): qty 30

## 2016-12-02 NOTE — Progress Notes (Signed)
PROGRESS NOTE  Valerie Terrell  [id number]:[id number] DOB: 01-25-1933 DOA: 11/30/2016 PCP: Tammi Sou, MD   Brief Narrative: Valerie Terrell is a 83 y.o. female with medical history significant of left kidney atrophy, retinal artery occlusion in the left eye, stage III chronic renal insufficiency, history of subarachnoid hemorrhage, hyperlipidemia, hypertension, lumbar spondylosis, paroxysmal atrial fibrillation on apixaban who presented to the ED 7/16 with progressively worse dyspnea, lower extremity edema, orthopnea and weakness for the past 2 weeks, but particularly worse in over the weekend. She has also had mild nausea, occasional palpitations and dizziness. Initial vital signs in the emergency department temperature 97.49F, pulse 63, blood pressure 155/67 mmHg, respirations 22 and O2 sat 93% on room air. Her WBC is 7.0, hemoglobin 11.3 g/dL and platelets 246. Her troponin level was 0.11, BNP over 4500, sodium 130, potassium 3.9, chloride 92, bicarbonate 24 mmol/L. Her BUN was 93, creatinine 4.21 up from 2.4 a week ago. Her chest radiograph shows interstitial and mild alveolar edema with left pleural effusion.  IV lasix was administered and the patient was admitted for ongoing diuresis.   Assessment & Plan: Principal Problem:   Pulmonary edema Active Problems:   PAF (paroxysmal atrial fibrillation) (HCC)   Essential hypertension   Acute renal failure superimposed on chronic kidney disease (HCC)   Hyperlipidemia   Elevated troponin level   Anemia  Acute hypoxic respiratory failure due to pulmonary edema: Due to volume overload from acute renal failure. BNP >4500.  - Continue supplemental oxygen prn - Will repeat echo with elevated troponin and uncertain precipitant of overload. Last echo showed diastolic dysfunction, EF 37-62%, with elevated left and right heart pressures.  - Diurese as below  Acute renal failure superimposed on stage III CKD: Follows with Dr. Florene Glen as outpatient. SCr  2.51 at recent discharge for similar presentation, up to 4.21 on admission. Uncertain cause, though she may not be taking diuretics as directed.  - Lasix 80mg  IV BID, holding spironolactone for now. - Monitor BMP and UOP.  Elevated troponin: Flat trend, likely demand ischemia with decreased clearance, though pt is having chest pain. ECG appears nonischemic.  - Will consult cardiology for consideration of ischemic work up.  Essential hypertension - Continue amlodipine (5mg  BID), hydralazine 100mg  TID, atenolol 100mg . Chlorthalidone stopped at last admission in March 2018. - Above goal, titrate as indicated with diuresis.   PAF: with documented pauses  - CHA2DS2-VASc Scoreat least 5.  - Continue atenolol, currently NSR - On IV heparin during acute renal failure (held currently due to nose bleeding)   Hyperlipidemia: Chronic, stable.  - Continue statin  Anemia of chronic disease:  - monitor CBC   DVT prophylaxis: Heparin IV Code Status: Full Family Communication: Daughter at bedside  Disposition Plan: TBD  Consultants:   Cardiology  Procedures:   None  Antimicrobials:  None   Subjective: Pt complaints of weakness, dizziness, ongoing chest pain, SOB and malaise.   Objective: Vitals:   12/01/16 2138 12/02/16 0500 12/02/16 0843 12/02/16 1259  BP: (!) 155/63 (!) 157/71 (!) 145/77 137/74  Pulse: 64 60    Resp: (!) 23 18    Temp:  (!) 97.3 F (36.3 C)    TempSrc:  Oral    SpO2: 97% 96%    Weight:  51 kg (112 lb 6.4 oz)    Height:        Intake/Output Summary (Last 24 hours) at 12/02/16 1324 Last data filed at 12/02/16 0906  Gross per 24 hour  Intake  360 ml  Output              700 ml  Net             -340 ml   Filed Weights   11/30/16 2230 12/01/16 0500 12/02/16 0500  Weight: 52.1 kg (114 lb 14.4 oz) 51.9 kg (114 lb 8 oz) 51 kg (112 lb 6.4 oz)   General exam: 83 y.o. female sitting up in bed, mild discomfort Respiratory system: Tachypneic on  3L by [place] without accessory muscle use. Crackles noted bilaterally Cardiovascular system: irregularly irregular No murmur, rub, or gallop. No JVD, and 2+ pitting LE edema. Gastrointestinal system: Abdomen soft, non-tender, non-distended, with normoactive bowel sounds. No organomegaly or masses felt. Central nervous system: Alert and oriented. No focal neurological deficits. Extremities: Warm, no deformities Skin: No rashes, lesions no ulcers Psychiatry: Judgement and insight appear normal. Mood & affect appropriate.   Data Reviewed: I have personally reviewed following labs and imaging studies  CBC:  Recent Labs Lab 11/30/16 1331 12/01/16 0229 12/02/16 0305  WBC 7.0 7.0 7.1  HGB 11.3* 10.6* 10.7*  HCT 34.5* 32.3* 32.5*  MCV 90.6 90.7 91.8  PLT 246 234 630   Basic Metabolic Panel:  Recent Labs Lab 11/30/16 1331 12/01/16 0229 12/02/16 0305  NA 130* 132* 134*  K 3.9 3.5 3.2*  CL 92* 95* 95*  CO2 24 25 27   GLUCOSE 123* 97 114*  BUN 93* 95* 91*  CREATININE 4.21* 3.95* 3.23*  CALCIUM 9.1 8.7* 8.3*   GFR: Estimated Creatinine Clearance: 9.3 mL/min (A) (by C-G formula based on SCr of 3.23 mg/dL (H)).  Cardiac Enzymes:  Recent Labs Lab 11/30/16 1331 11/30/16 2033 12/01/16 0229  TROPONINI 0.11* 0.09* 0.09*   CBG:  Recent Labs Lab 11/30/16 1617  GLUCAP 125*   Urine analysis:    Component Value Date/Time   COLORURINE YELLOW 11/30/2016 1610   APPEARANCEUR CLEAR 11/30/2016 1610   LABSPEC 1.013 11/30/2016 1610   PHURINE 5.0 11/30/2016 1610   GLUCOSEU NEGATIVE 11/30/2016 1610   HGBUR NEGATIVE 11/30/2016 1610   BILIRUBINUR NEGATIVE 11/30/2016 1610   BILIRUBINUR negative 09/24/2016 [place] 11/30/2016 1610   PROTEINUR 30 (A) 11/30/2016 1610   UROBILINOGEN 0.2 09/24/2016 1453   NITRITE NEGATIVE 11/30/2016 1610   LEUKOCYTESUR SMALL (A) 11/30/2016 1610   Radiology Studies: Dg Chest 2 View  Result Date: 11/30/2016 CLINICAL DATA:  83 y/o  F;  shortness of breath. EXAM: CHEST  2 VIEW COMPARISON:  08/05/2016 chest radiograph FINDINGS: Stable moderate cardiomegaly. Aortic atherosclerosis with calcification. Stable small left pleural effusion. Stable left lung base opacity. Interstitial pulmonary edema. Increased perihilar haziness probably represents developing alveolar edema. Midthoracic compression deformity post kyphoplasty. IMPRESSION: Interstitial and mild alveolar pulmonary edema. Small left pleural effusion with left basilar opacity probably representing associated atelectasis. Stable cardiomegaly. Electronically Signed   By: Kristine Garbe M.D.   On: 11/30/2016 17:57    LOS: 2 days   Time spent: 30 minutes  Irwin Brakeman, MD [facility] Pager [phone]  If 7PM-7AM, please contact night-coverage [email or web address] Password [email or web address] 12/02/2016, 1:24 PM

## 2016-12-02 NOTE — Consult Note (Signed)
   [facility] CM Inpatient Consult   12/02/2016  Valerie Terrell 09/22/1932 [id number]   Patient assessed for restart of services with [facility] services in the [place].  Patient has had 4 hospitalizations in the past 6 months. Per H&P this patient is Valerie Terrell is a 83 y.o. female with medical history significant of left kidney atrophy, retinal artery occlusion in the left eye, stage IV chronic renal insufficiency.   Met with the patient briefly as she states when speaking with her.  "I am feeling really bad right now."  Told patient follow up can be done at a later time.  Staff in with patient will follow up for any community care management needs for restart of services. For questions, please contact:  Natividad Brood, RN BSN [facility] Liaison  [phone] business mobile phone Toll free office [phone]

## 2016-12-02 NOTE — Progress Notes (Signed)
  Echocardiogram 2D Echocardiogram has been performed.  Valerie Terrell 12/02/2016, 12:31 PM

## 2016-12-02 NOTE — Progress Notes (Signed)
Pharmacist Heart Failure Core Measure Documentation  Assessment: Valerie Terrell has an EF documented as 35%  by ECHO.  Rationale: Heart failure patients with left ventricular systolic dysfunction (LVSD) and an EF < 40% should be prescribed an angiotensin converting enzyme inhibitor (ACEI) or angiotensin receptor blocker (ARB) at discharge unless a contraindication is documented in the medical record.  This patient is not currently on an ACEI or ARB for HF.  This note is being placed in the record in order to provide documentation that a contraindication to the use of these agents is present for this encounter.  ACE Inhibitor or Angiotensin Receptor Blocker is contraindicated (specify all that apply)  []   ACEI allergy AND ARB allergy []   Angioedema []   Moderate or severe aortic stenosis []   Hyperkalemia []   Hypotension []   Renal artery stenosis [x]   Worsening renal function, preexisting renal disease or dysfunction   Bonnita Nasuti Pharm.D. CPP, BCPS Clinical Pharmacist [phone] 12/02/2016 10:36 PM

## 2016-12-02 NOTE — Progress Notes (Signed)
Pt had a 3.38 sec pause. Reino Bellis PA-C updated via text page.

## 2016-12-02 NOTE — Progress Notes (Signed)
Progress Note  Patient Name: Valerie Terrell Date of Encounter: 12/02/2016  Primary Cardiologist: Gwenlyn Found   Subjective   Feeling bad this morning. Back in Afib, tachy/brady at times.   Inpatient Medications    Scheduled Meds: . amLODipine  5 mg Oral BID WC  . atenolol  100 mg Oral Daily  . feeding supplement (ENSURE ENLIVE)  237 mL Oral BID BM  . furosemide  80 mg Intravenous BID  . hydrALAZINE  100 mg Oral TID  . mouth rinse  15 mL Mouth Rinse BID  . potassium chloride  40 mEq Oral Daily  . pravastatin  40 mg Oral QPM  . vitamin C  500 mg Oral Daily   Continuous Infusions:  PRN Meds: acetaminophen, ondansetron, oxyCODONE-acetaminophen, phenol   Vital Signs    Vitals:   12/01/16 2037 12/01/16 2138 12/02/16 0500 12/02/16 0843  BP: (!) 141/64 (!) 155/63 (!) 157/71 (!) 145/77  Pulse: 64 64 60   Resp: 18 (!) 23 18   Temp: 98.5 F (36.9 C)  (!) 97.3 F (36.3 C)   TempSrc: Oral  Oral   SpO2: 98% 97% 96%   Weight:   112 lb 6.4 oz (51 kg)   Height:        Intake/Output Summary (Last 24 hours) at 12/02/16 1013 Last data filed at 12/02/16 4132  Gross per 24 hour  Intake              480 ml  Output              900 ml  Net             -420 ml   Filed Weights   11/30/16 2230 12/01/16 0500 12/02/16 0500  Weight: 114 lb 14.4 oz (52.1 kg) 114 lb 8 oz (51.9 kg) 112 lb 6.4 oz (51 kg)    Telemetry    Afib, rates 50-120s with intermittent pauses 3 to 4 secs noted - Personally Reviewed  ECG    N/A - Personally Reviewed  Physical Exam   General: Thin older W female appearing in no acute distress. Head: Normocephalic, atraumatic.  Neck: Supple without bruits, JVD. Lungs:  Resp regular and unlabored, Diminished bilateral . Heart: Irreg, S1, S2, no S3, S4, or murmur; no rub. Abdomen: Soft, non-tender, non-distended with normoactive bowel sounds. No hepatomegaly. No rebound/guarding. No obvious abdominal masses. Extremities: No clubbing, cyanosis, 2+ Bilateral LE edema.  Distal pedal pulses are 2+ bilaterally. Neuro: Alert and oriented X 3. Moves all extremities spontaneously. Psych: Normal affect.  Labs    Chemistry Recent Labs Lab 11/30/16 1331 12/01/16 0229 12/02/16 0305  NA 130* 132* 134*  K 3.9 3.5 3.2*  CL 92* 95* 95*  CO2 24 25 27   GLUCOSE 123* 97 114*  BUN 93* 95* 91*  CREATININE 4.21* 3.95* 3.23*  CALCIUM 9.1 8.7* 8.3*  GFRNONAA 9* 10* 12*  GFRAA 10* 11* 14*  ANIONGAP 14 12 12      Hematology Recent Labs Lab 11/30/16 1331 12/01/16 0229 12/02/16 0305  WBC 7.0 7.0 7.1  RBC 3.81* 3.56* 3.54*  HGB 11.3* 10.6* 10.7*  HCT 34.5* 32.3* 32.5*  MCV 90.6 90.7 91.8  MCH 29.7 29.8 30.2  MCHC 32.8 32.8 32.9  RDW 13.6 13.5 13.8  PLT 246 234 247    Cardiac Enzymes Recent Labs Lab 11/30/16 1331 11/30/16 2033 12/01/16 0229  TROPONINI 0.11* 0.09* 0.09*   No results for input(s): TROPIPOC in the last 168 hours.   BNP Recent  Labs Lab 11/30/16 1331  BNP >4,500.0*     DDimer No results for input(s): DDIMER in the last 168 hours.    Radiology    Dg Chest 2 View  Result Date: 11/30/2016 CLINICAL DATA:  83 y/o  F; shortness of breath. EXAM: CHEST  2 VIEW COMPARISON:  08/05/2016 chest radiograph FINDINGS: Stable moderate cardiomegaly. Aortic atherosclerosis with calcification. Stable small left pleural effusion. Stable left lung base opacity. Interstitial pulmonary edema. Increased perihilar haziness probably represents developing alveolar edema. Midthoracic compression deformity post kyphoplasty. IMPRESSION: Interstitial and mild alveolar pulmonary edema. Small left pleural effusion with left basilar opacity probably representing associated atelectasis. Stable cardiomegaly. Electronically Signed   By: Kristine Garbe M.D.   On: 11/30/2016 17:57    Cardiac Studies   TTE: pending  Patient Profile     83 y.o. female with PMH of HTN, CKD III, HL, PAF, Chronic diastolic HF and atrophy of the left kidney who presented with  worsening dyspnea, nausea and palpitations.   Assessment & Plan    1. Acute on Chronic diastolic HF: BNP >0960 this admission. CXR with edema. Currently being diuresed with IV lasix, UOP 1L  Yesterday. Weight trending down. She states her breathing is better, and legs are less swollen today.  -- would continue with IV lasix, suggest nephrology involvement given degree of renal disease  2. Elevated Troponin: Mild flat Trop trend, suspect 2/2 demand ischemia in the setting of HF and CKD.  -- echo pending for EF and WMA  3. PAF: In Afib this morning, rates between 50-120s. Suspect this has been exacerbated by her HF. On Eliquis at home, but placed on IV heparin pending work up. Reported nose bleed/small rectal bleeding this morning, therefore heparin was held.  -- Hgb stable 10.7 this morning.  -- K+ low, check TSH  4. Pauses: Telemetry noted multiple pauses between 3-5 seconds this morning. Episodes seem to have resolved. Has already received BB this morning prior to this episodes. She is symptomatic with this episodes.  -- would hold atenolol, follow closely on telemetry -- will ask EP to evaluate given duration and frequency of pauses  5. CKD III: Cr mildly improved this morning 3.23. Recs as above -- daily BMET  6. HTN: Remains slightly hypertensive.  -- continue hydralazine, and norvasc for now   Signed, Reino Bellis, NP  12/02/2016, 10:13 AM     Agree with note by Reino Bellis NP-C  Ms. Mchaney has had minimal diuresis since yesterday. Her serum creatinine has improved from 4.2 down to 3.2. She is on high-dose IV furosemide twice a day. She has gone into A. fib since yesterday has had some long pauses up to 5 seconds. She is symptomatic in this. We've asked for electrophysiology service to see her. She has been placed on IV heparin as well. A 2-D echo has been performed which preliminarily shows an EF in the 45% range. We are awaiting nephrology input as well.  Lorretta Harp, M.D., [place], [facility], [place] [address] [place],   [address]  [phone] 12/02/2016 11:43 AM

## 2016-12-02 NOTE — Consult Note (Signed)
Cardiology Consultation:   [name]; [id number]; [birth year]   Admit date: 11/30/2016 Date of Consult: 12/02/2016  Primary Care Provider: Tammi Sou, MD Primary Cardiologist: Dr.Berry Primary Electrophysiologist:  None previously   Patient Profile:   Valerie Terrell is a 83 y.o. female with a hx of atrophy of the left kidney (absent blood flow by renal artery doppler, Dr. Gwenlyn Found), HTN, CRI (III), HLD, PAFib on a/c, record indciates hx of SAH who is being seen today for the evaluation of AFib, pauses at the request of Dr. Gwenlyn Found.  History of Present Illness:   [name] was admitted to [facility] 7/161/8 with increasing DOE over the periods of a couple weeks, felt to be fluid OL with diastolic CHF exacerbation.  She was admitted and diuretics started, her home Eliquis stopped and started on heparin gtt given renal function. (though held last evening 2/2 a nose bleed) though to be resumed this AM. She is cumulatively negative so far only 845ml and 2 lbs not felt yet to her baseline  This AM she mentioned not feeling as well as she had been last evening and noted to have converted to AFib rates 90's, she was given her usual home dose of atenolol (100mg ) this morning and noted to have post termination pauses, longest 4.75seconds.   LABS K+ 3.9 > 3.2 BUN/Creat 93/4.21 > 91/3.23 BNP >4500 WBC 11.3, 7.1 H/H 11/34 > 10.7/32.5 plts 246 Tro[I: 0.11, 0.09, 0.09  Past Medical History:  Diagnosis Date  . Atrophy of left kidney    with absent blood flow by renal artery dopplers (Dr. Gwenlyn Found)  . 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 Chronic renal insufficiency, stage 4 (severe) (HCC)    Baseline GFR 25 ml/min-- renal u/s showed atrophic/hypoplastic left kidney.  No hydronephrosis.  Small right renal cyst.  . History of adenomatous polyp of colon   . History of subarachnoid hemorrhage 10/2014   after syncope and while on xarelto  . Hyperlipidemia   . Hypertension     Difficult to control, in the setting of one functioning kidney: pt was referred to nephrology by Dr. Gwenlyn Found 06/2015.  . Lumbar radiculopathy 2012  . Lumbar spondylosis    MR 07/2016---no sign of spinal nerve compression or cord compression.  Pt set up with outpt ortho while admitted to hosp 07/2016.  . Metatarsal fracture 06/10/2016   Nondisplaced, left 5th metatarsal--pt was referred to ortho  . Osteopenia 2014   T-score -2.1  . PAF (paroxysmal atrial fibrillation) (HCC)    Eliquis started after BRAO and CVA  . Pulmonary edema 11/2016  . Stroke [facility])    cardioembolic (had CVA while on no anticoag)--"scattered subacute punctate infarcts: 1 in R parietal lobe and 2 in occipital cortex" on MRI br.  CT angio head/neck: aortic arch athero.   . Thoracic compression fracture ([place]) 07/2016   T7 and T8-- T8 kyphoplasty during hosp admission 07/2016.  Neuro referred pt to pain mgmt for consideration of injection 09/2016.  I referred her to endo 08/2016 for consideration of calcitonin treatment.    Past Surgical History:  Procedure Laterality Date  . APPENDECTOMY  child  . CARDIOVASCULAR STRESS TEST  01/31/2016   Stress myoview: NORMAL/Low risk.  EF 56%.  . [place]  . COLONOSCOPY  2015   + hx of adenomatous polyps.  Need digest health spec in [place] records to see when pt due for next colonoscopy  . IR GENERIC HISTORICAL  07/24/2016  IR KYPHO THORACIC WITH BONE BIOPSY 07/24/2016 Luanne Bras, MD [facility]-INTERV RAD  . KYPHOPLASTY  07/27/2016   T8  . Renal artery dopplers  02/26/2016   Her right renal dimension was 11 cm pole to pole with mild to moderate right renal artery stenosis. A right renal aortic ratio was 3.22 suggesting less than a 50% stenosis.  . TONSILLECTOMY    . TRANSTHORACIC ECHOCARDIOGRAM  01/29/2016   EF 55-60%, normal LV wall motion, grade I DD.  No cardiac source of emboli was seen.     Inpatient Medications: Scheduled Meds: . amLODipine  5 mg Oral BID WC  .  feeding supplement (ENSURE ENLIVE)  237 mL Oral BID BM  . furosemide  80 mg Intravenous BID  . hydrALAZINE  100 mg Oral TID  . mouth rinse  15 mL Mouth Rinse BID  . potassium chloride  40 mEq Oral Daily  . pravastatin  40 mg Oral QPM  . vitamin C  500 mg Oral Daily   Continuous Infusions:  PRN Meds: acetaminophen, ondansetron, oxyCODONE-acetaminophen, phenol  Allergies:    Allergies  Allergen Reactions  . Clonidine Derivatives Palpitations and Other (See Comments)    Very sedated  . Codeine Nausea Only  . Nickel Rash  . Sulfa Antibiotics Other (See Comments)    Reaction unknown    Social History:   Social History   Social History  . Marital status: Widowed    Spouse name: N/A  . Number of children: N/A  . Years of education: N/A   Occupational History  . Not on file.   Social History Main Topics  . Smoking status: Never Smoker  . Smokeless tobacco: Never Used  . Alcohol use 0.6 oz/week    1 Glasses of wine per week     Comment: wine  . Drug use: No  . Sexual activity: No   Other Topics Concern  . Not on file   Social History Narrative   Widow.  One daughter, lives with her.   Educ: college   Occup: retired Marine scientist.   No T/A/Ds.   She is almost a vegetarian.    Family History:   The patient's family history includes Cancer in her mother; Early death in her father; Heart disease in her father. There is no history of Sudden Cardiac Death.  ROS:  Please see the history of present illness.  ROS  All other ROS reviewed and negative.     Physical Exam/Data:   Vitals:   12/01/16 2037 12/01/16 2138 12/02/16 0500 12/02/16 0843  BP: (!) 141/64 (!) 155/63 (!) 157/71 (!) 145/77  Pulse: 64 64 60   Resp: 18 (!) 23 18   Temp: 98.5 F (36.9 C)  (!) 97.3 F (36.3 C)   TempSrc: Oral  Oral   SpO2: 98% 97% 96%   Weight:   112 lb 6.4 oz (51 kg)   Height:        Intake/Output Summary (Last 24 hours) at 12/02/16 1125 Last data filed at 12/02/16 0906  Gross per  24 hour  Intake              480 ml  Output              900 ml  Net             -420 ml   Filed Weights   11/30/16 2230 12/01/16 0500 12/02/16 0500  Weight: 114 lb 14.4 oz (52.1 kg) 114 lb 8 oz (51.9 kg)  112 lb 6.4 oz (51 kg)   Body mass index is 22.7 kg/m.  General:  Well nourished, well developed, body habitus is thin, in no acute distress, though verbally tells me she is feeling poorly HEENT: normal Lymph: no adenopathy Neck: ++ JVD Endocrine:  No thryomegaly Vascular: No carotid bruits; FA pulses 2+ bilaterally without bruits  Cardiac:  IRRR; 1+ SM Lungs:  Decreased at the bases b/l, no wheezing, rhonchi or rales  Abd: soft, nontender, no hepatomegaly  Ext:  2+ edema b/l Musculoskeletal:  Age appropriate atrophy Skin: warm and dry  Neuro:  CNs 2-12 intact, no focal abnormalities noted Psych:  Normal affect   EKG:  The EKG was personally reviewed and demonstrates:   #1 SR 65bpm, PR 149ms, QRS 40ms, QTc 450ms #2 SR 69bpm, PR 138ms, QRS 6ms, QTc 486ms Telemetry:  Telemetry was personally reviewed and demonstrates:   SR 60's, this morning converted to AFib 90's-100 range with a few post termination pauses longest 4.75 seconds.  Currently remains in AF 90's   Relevant CV Studies:  07/14/16: TTE Study Conclusions - Procedure narrative: Transthoracic echocardiography. Image   quality was adequate. The study was technically difficult, as a   result of restricted patient mobility. - Left ventricle: The cavity size was normal. Wall thickness was   increased in a pattern of moderate LVH. Systolic function was   normal. The estimated ejection fraction was in the range of 60%   to 65%. Wall motion was normal; there were no regional wall   motion abnormalities. Doppler parameters are consistent with   abnormal left ventricular relaxation (grade 1 diastolic   dysfunction). The E/e&' ratio is >15, suggesting elevated LV   filling pressure. - Aortic valve: Trileaflet. Sclerosis  without stenosis. There was   no regurgitation. - Mitral valve: Calcified annulus. Mildly thickened leaflets . No   significant inflow respiratory variation. There was mild   regurgitation. - Left atrium: Severely dilated. (42mm) - Atrial septum: No defect or patent foramen ovale was identified. - Systemic veins: The IVC measures >2.1 cm, but collapses >50%,   suggesting an elevated RA pressure of 8 mmHg. - Pericardium, extracardiac: Small posterior pericardial effusion.   No tamponade features. Impressions: - Compared to the prior study in 2017, the LVEF is higher at   60-65%. There is moderate LVH. The left atrium is severely   dilated. A small posterior pericardial effusion is noted without   tamponade features. There is diastolic dysfunction with elevated   left and right heart pressures.   Nuclear Stress Test 01/31/2016 IMPRESSION: 1. No reversible ischemia or infarction. 2. Normal left ventricular wall motion. 3. Left ventricular ejection fraction 56% 4. Non invasive risk stratification*: Low  Laboratory Data:  Chemistry Recent Labs Lab 11/30/16 1331 12/01/16 0229 12/02/16 0305  NA 130* 132* 134*  K 3.9 3.5 3.2*  CL 92* 95* 95*  CO2 24 25 27   GLUCOSE 123* 97 114*  BUN 93* 95* 91*  CREATININE 4.21* 3.95* 3.23*  CALCIUM 9.1 8.7* 8.3*  GFRNONAA 9* 10* 12*  GFRAA 10* 11* 14*  ANIONGAP 14 12 12     Hematology Recent Labs Lab 11/30/16 1331 12/01/16 0229 12/02/16 0305  WBC 7.0 7.0 7.1  RBC 3.81* 3.56* 3.54*  HGB 11.3* 10.6* 10.7*  HCT 34.5* 32.3* 32.5*  MCV 90.6 90.7 91.8  MCH 29.7 29.8 30.2  MCHC 32.8 32.8 32.9  RDW 13.6 13.5 13.8  PLT 246 234 247   Cardiac Enzymes Recent Labs Lab 11/30/16  1331 11/30/16 2033 12/01/16 0229  TROPONINI 0.11* 0.09* 0.09*   No results for input(s): TROPIPOC in the last 168 hours.  BNP Recent Labs Lab 11/30/16 1331  BNP >4,500.0*     Radiology/Studies:  Dg Chest 2 View Result Date: 11/30/2016 CLINICAL DATA:  83  y/o  F; shortness of breath. EXAM: CHEST  2 VIEW COMPARISON:  08/05/2016 chest radiograph FINDINGS: Stable moderate cardiomegaly. Aortic atherosclerosis with calcification. Stable small left pleural effusion. Stable left lung base opacity. Interstitial pulmonary edema. Increased perihilar haziness probably represents developing alveolar edema. Midthoracic compression deformity post kyphoplasty. IMPRESSION: Interstitial and mild alveolar pulmonary edema. Small left pleural effusion with left basilar opacity probably representing associated atelectasis. Stable cardiomegaly. Electronically Signed   By: Kristine Garbe M.D.   On: 11/30/2016 17:57    Assessment and Plan:   1. Paroxysmal AFib     CAH2DS2Vasc is 6, on Eliquis out patient, heparin gtt here     Post termination pauses this AM, her sinus rates 60's, no baseline conduction system disease, no brady events that I saw on telemetry      May consider AAD to maintain SR, though options are limited, and likely only amiodarone vs pacing     Patient definitely feels worse in AF and reports brief episodes of feeling lightheaded this morning (though while I am talking to her she has one without pausing  Maintaining SR in acute CHF exacerbation  and last echo with severely dilated LA may be difficult, though AF will likely make CHF management more difficult.  I am inclined to initiate amiodarone, will discuss with Dr. Lovena Le.  Agree with heparin, she has hx of embolic event/CVA when off a/c historically, record indicate she was on xarelto after a orthostatic event/syncope suffered a SAH and maintained on ASA, subsequently suffered a CVA >> Eliquis  2.  Acute chronic CHF (diastolic)      C/w primary cardiology team  3. CRI     Follows with nephrology out patient     Stage III  4. HTN     Looks OK  5. CP     Sharp, worse with inspiration/certain movements (with/without the AF)     Cardiology felt Trop flat/and likely 2/2 CHF and AKI/CRI      Pending echo for further evaluation   Signed, Baldwin [place], PA-C  12/02/2016 11:25 AM  EP Attending  Patient seen and examined. Agree with above except that she is now having pauses of up to 6 seconds due to sinus node dysfunction due to post-termination pauses. The patient will likely need systemic anti-coagulation with warfarin and treatment of her atrial fib with amiodarone as her kidney function will not allow for an [place]. She will be off of IV heparin for 48 hours after PPM. I would anticipate amiodarone for control of her atrial fib and would anticipate adding a beta blocker after her PPM in in place.  Mikle Bosworth.D.

## 2016-12-02 NOTE — Progress Notes (Signed)
PT Cancellation Note  Patient Details Name: Valerie Terrell MRN: [id number] DOB: 1932/12/31   Cancelled Treatment:    Reason Eval/Treat Not Completed: Medical issues which prohibited therapy;Patient not medically ready. Pt going for pacer placement soon and not feeling well. Please reorder when appropriate after procedure.    [place] 12/02/2016, 3:38 PM

## 2016-12-02 NOTE — Progress Notes (Signed)
  Progress Note   Date: 12/02/2016  Patient Name: Valerie Terrell        MRN#: [id number]  Review of the patient's clinical findings supports the diagnosis of  Severe protein calorie malnutrition   Irwin Brakeman, MD

## 2016-12-02 NOTE — Progress Notes (Signed)
Offered Pt a bath. Pt stated no she did not want a bath, but requested to wash her face and hands. Tech also performed peri care after Pt used the bathroom.

## 2016-12-03 ENCOUNTER — Encounter (HOSPITAL_COMMUNITY)
Admission: EM | Disposition: A | Discharge status: Home Health | Payer: Self-pay | Source: Home / Self Care | Attending: Family Medicine

## 2016-12-03 ENCOUNTER — Encounter (HOSPITAL_COMMUNITY): Payer: Self-pay | Admitting: Internal Medicine

## 2016-12-03 DIAGNOSIS — I495 Sick sinus syndrome: Secondary | ICD-10-CM

## 2016-12-03 HISTORY — PX: PACEMAKER IMPLANT: EP1218

## 2016-12-03 LAB — SURGICAL PCR SCREEN
MRSA, PCR: NEGATIVE
Staphylococcus aureus: NEGATIVE

## 2016-12-03 LAB — CBC
HEMATOCRIT: 33.4 % — AB (ref 36.0–46.0)
HEMOGLOBIN: 10.5 g/dL — AB (ref 12.0–15.0)
MCH: 28.9 pg (ref 26.0–34.0)
MCHC: 31.4 g/dL (ref 30.0–36.0)
MCV: 92 fL (ref 78.0–100.0)
Platelets: 285 10*3/uL (ref 150–400)
RBC: 3.63 MIL/uL — AB (ref 3.87–5.11)
RDW: 13.6 % (ref 11.5–15.5)
WBC: 9.3 10*3/uL (ref 4.0–10.5)

## 2016-12-03 LAB — TSH: TSH: 1.528 u[IU]/mL (ref 0.350–4.500)

## 2016-12-03 LAB — BASIC METABOLIC PANEL
ANION GAP: 13 (ref 5–15)
BUN: 88 mg/dL — ABNORMAL HIGH (ref 6–20)
CHLORIDE: 96 mmol/L — AB (ref 101–111)
CO2: 24 mmol/L (ref 22–32)
Calcium: 8.4 mg/dL — ABNORMAL LOW (ref 8.9–10.3)
Creatinine, Ser: 3.17 mg/dL — ABNORMAL HIGH (ref 0.44–1.00)
GFR calc non Af Amer: 13 mL/min — ABNORMAL LOW (ref >60)
GFR, EST AFRICAN AMERICAN: 15 mL/min — AB (ref >60)
Glucose, Bld: 102 mg/dL — ABNORMAL HIGH (ref 65–99)
POTASSIUM: 4.5 mmol/L (ref 3.5–5.1)
Sodium: 133 mmol/L — ABNORMAL LOW (ref 135–145)

## 2016-12-03 LAB — GLUCOSE, CAPILLARY: Glucose-Capillary: 104 mg/dL — ABNORMAL HIGH (ref 65–99)

## 2016-12-03 SURGERY — PACEMAKER IMPLANT

## 2016-12-03 MED ORDER — CEFAZOLIN SODIUM-DEXTROSE 2-4 GM/100ML-% IV SOLN
INTRAVENOUS | Status: AC
  Filled 2016-12-03: qty 100

## 2016-12-03 MED ORDER — MIDAZOLAM HCL 5 MG/5ML IJ SOLN
INTRAMUSCULAR | Status: AC
  Filled 2016-12-03: qty 5

## 2016-12-03 MED ORDER — FENTANYL CITRATE (PF) 100 MCG/2ML IJ SOLN
INTRAMUSCULAR | Status: DC | PRN
  Administered 2016-12-03: 25 ug via INTRAVENOUS

## 2016-12-03 MED ORDER — WARFARIN - PHYSICIAN DOSING INPATIENT: Freq: Every day | Status: DC

## 2016-12-03 MED ORDER — HEPARIN (PORCINE) IN NACL 2-0.9 UNIT/ML-% IJ SOLN
INTRAMUSCULAR | Status: AC
  Filled 2016-12-03: qty 500

## 2016-12-03 MED ORDER — MORPHINE SULFATE (PF) 2 MG/ML IV SOLN: 1.0000 mg | INTRAVENOUS | Status: DC | PRN

## 2016-12-03 MED ORDER — LIDOCAINE HCL (PF) 1 % IJ SOLN
INTRAMUSCULAR | Status: AC
  Filled 2016-12-03: qty 30

## 2016-12-03 MED ORDER — FENTANYL CITRATE (PF) 100 MCG/2ML IJ SOLN
INTRAMUSCULAR | Status: AC
  Filled 2016-12-03: qty 2

## 2016-12-03 MED ORDER — MIDAZOLAM HCL 5 MG/5ML IJ SOLN
INTRAMUSCULAR | Status: DC | PRN
  Administered 2016-12-03: 1 mg via INTRAVENOUS

## 2016-12-03 MED ORDER — ACETAMINOPHEN 325 MG PO TABS
325.0000 mg | ORAL_TABLET | ORAL | Status: DC | PRN
  Administered 2016-12-03 – 2016-12-05 (×4): 650 mg via ORAL
  Administered 2016-12-06: 325 mg via ORAL
  Filled 2016-12-03: qty 1
  Filled 2016-12-03 (×4): qty 2

## 2016-12-03 MED ORDER — ONDANSETRON HCL 4 MG/2ML IJ SOLN
4.0000 mg | Freq: Four times a day (QID) | INTRAMUSCULAR | Status: DC | PRN
  Administered 2016-12-04 (×2): 4 mg via INTRAVENOUS
  Filled 2016-12-03 (×3): qty 2

## 2016-12-03 MED ORDER — HEPARIN (PORCINE) IN NACL 2-0.9 UNIT/ML-% IJ SOLN
INTRAMUSCULAR | Status: AC | PRN
  Administered 2016-12-03: 500 mL

## 2016-12-03 MED ORDER — LIDOCAINE HCL (PF) 1 % IJ SOLN
INTRAMUSCULAR | Status: DC | PRN
  Administered 2016-12-03: 45 mL

## 2016-12-03 MED ORDER — CEFAZOLIN SODIUM-DEXTROSE 1-4 GM/50ML-% IV SOLN
1.0000 g | Freq: Four times a day (QID) | INTRAVENOUS | Status: AC
  Administered 2016-12-03: 1 g via INTRAVENOUS
  Filled 2016-12-03: qty 50

## 2016-12-03 MED ORDER — SODIUM CHLORIDE 0.9 % IV SOLN
INTRAVENOUS | Status: AC | PRN
  Administered 2016-12-03: 250 mL via INTRAVENOUS

## 2016-12-03 MED ORDER — WARFARIN SODIUM 2.5 MG PO TABS
2.5000 mg | ORAL_TABLET | Freq: Once | ORAL | Status: AC
  Administered 2016-12-03: 2.5 mg via ORAL
  Filled 2016-12-03: qty 1

## 2016-12-03 MED ORDER — GENTAMICIN SULFATE 40 MG/ML IJ SOLN
INTRAMUSCULAR | Status: AC
  Filled 2016-12-03: qty 2

## 2016-12-03 SURGICAL SUPPLY — 8 items
CABLE SURGICAL S-101-97-12 (CABLE) ×2 IMPLANT
LEAD TENDRIL MRI 46CM LPA1200M (Lead) ×2 IMPLANT
LEAD TENDRIL MRI 52CM LPA1200M (Lead) ×2 IMPLANT
PACEMAKER ASSURITY DR-RF (Pacemaker) ×2 IMPLANT
PAD DEFIB LIFELINK (PAD) ×2 IMPLANT
SHEATH CLASSIC 8F (SHEATH) ×4 IMPLANT
TRAY PACEMAKER INSERTION (PACKS) ×2 IMPLANT
WIRE HI TORQ VERSACORE-J 145CM (WIRE) ×2 IMPLANT

## 2016-12-03 NOTE — H&P (View-Only) (Signed)
Cardiology Consultation:   [name]; [id number]; 04-16-1933   Admit date: 11/30/2016 Date of Consult: 12/02/2016  Primary Care Provider: Tammi Sou, MD Primary Cardiologist: Dr.Berry Primary Electrophysiologist:  None previously   Patient Profile:   Valerie Terrell is a 83 y.o. female with a hx of atrophy of the left kidney (absent blood flow by renal artery doppler, Dr. Gwenlyn Found), HTN, CRI (III), HLD, PAFib on a/c, record indciates hx of SAH who is being seen today for the evaluation of AFib, pauses at the request of Dr. Gwenlyn Found.  History of Present Illness:   Valerie Terrell was admitted to [facility] 7/161/8 with increasing DOE over the periods of a couple weeks, felt to be fluid OL with diastolic CHF exacerbation.  She was admitted and diuretics started, her home Eliquis stopped and started on heparin gtt given renal function. (though held last evening 2/2 a nose bleed) though to be resumed this AM. She is cumulatively negative so far only 852ml and 2 lbs not felt yet to her baseline  This AM she mentioned not feeling as well as she had been last evening and noted to have converted to AFib rates 90's, she was given her usual home dose of atenolol (100mg ) this morning and noted to have post termination pauses, longest 4.75seconds.   LABS K+ 3.9 > 3.2 BUN/Creat 93/4.21 > 91/3.23 BNP >4500 WBC 11.3, 7.1 H/H 11/34 > 10.7/32.5 plts 246 Tro[I: 0.11, 0.09, 0.09  Past Medical History:  Diagnosis Date  . Atrophy of left kidney    with absent blood flow by renal artery dopplers (Dr. Gwenlyn Found)  . 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 Chronic renal insufficiency, stage 4 (severe) (HCC)    Baseline GFR 25 ml/min-- renal u/s showed atrophic/hypoplastic left kidney.  No hydronephrosis.  Small right renal cyst.  . History of adenomatous polyp of colon   . History of subarachnoid hemorrhage 10/2014   after syncope and while on xarelto  . Hyperlipidemia   . Hypertension     Difficult to control, in the setting of one functioning kidney: pt was referred to nephrology by Dr. Gwenlyn Found 06/2015.  . Lumbar radiculopathy 2012  . Lumbar spondylosis    MR 07/2016---no sign of spinal nerve compression or cord compression.  Pt set up with outpt ortho while admitted to hosp 07/2016.  . Metatarsal fracture 06/10/2016   Nondisplaced, left 5th metatarsal--pt was referred to ortho  . Osteopenia 2014   T-score -2.1  . PAF (paroxysmal atrial fibrillation) (HCC)    Eliquis started after BRAO and CVA  . Pulmonary edema 11/2016  . Stroke [facility])    cardioembolic (had CVA while on no anticoag)--"scattered subacute punctate infarcts: 1 in R parietal lobe and 2 in occipital cortex" on MRI br.  CT angio head/neck: aortic arch athero.   . Thoracic compression fracture ([place]) 07/2016   T7 and T8-- T8 kyphoplasty during hosp admission 07/2016.  Neuro referred pt to pain mgmt for consideration of injection 09/2016.  I referred her to endo 08/2016 for consideration of calcitonin treatment.    Past Surgical History:  Procedure Laterality Date  . APPENDECTOMY  child  . CARDIOVASCULAR STRESS TEST  01/31/2016   Stress myoview: NORMAL/Low risk.  EF 56%.  . [place]  . COLONOSCOPY  2015   + hx of adenomatous polyps.  Need digest health spec in [place] records to see when pt due for next colonoscopy  . IR GENERIC HISTORICAL  07/24/2016  IR KYPHO THORACIC WITH BONE BIOPSY 07/24/2016 Luanne Bras, MD [facility]-INTERV RAD  . KYPHOPLASTY  07/27/2016   T8  . Renal artery dopplers  02/26/2016   Her right renal dimension was 11 cm pole to pole with mild to moderate right renal artery stenosis. A right renal aortic ratio was 3.22 suggesting less than a 50% stenosis.  . TONSILLECTOMY    . TRANSTHORACIC ECHOCARDIOGRAM  01/29/2016   EF 55-60%, normal LV wall motion, grade I DD.  No cardiac source of emboli was seen.     Inpatient Medications: Scheduled Meds: . amLODipine  5 mg Oral BID WC  .  feeding supplement (ENSURE ENLIVE)  237 mL Oral BID BM  . furosemide  80 mg Intravenous BID  . hydrALAZINE  100 mg Oral TID  . mouth rinse  15 mL Mouth Rinse BID  . potassium chloride  40 mEq Oral Daily  . pravastatin  40 mg Oral QPM  . vitamin C  500 mg Oral Daily   Continuous Infusions:  PRN Meds: acetaminophen, ondansetron, oxyCODONE-acetaminophen, phenol  Allergies:    Allergies  Allergen Reactions  . Clonidine Derivatives Palpitations and Other (See Comments)    Very sedated  . Codeine Nausea Only  . Nickel Rash  . Sulfa Antibiotics Other (See Comments)    Reaction unknown    Social History:   Social History   Social History  . Marital status: Widowed    Spouse name: N/A  . Number of children: N/A  . Years of education: N/A   Occupational History  . Not on file.   Social History Main Topics  . Smoking status: Never Smoker  . Smokeless tobacco: Never Used  . Alcohol use 0.6 oz/week    1 Glasses of wine per week     Comment: wine  . Drug use: No  . Sexual activity: No   Other Topics Concern  . Not on file   Social History Narrative   Widow.  One daughter, lives with her.   Educ: college   Occup: retired Marine scientist.   No T/A/Ds.   She is almost a vegetarian.    Family History:   The patient's family history includes Cancer in her mother; Early death in her father; Heart disease in her father. There is no history of Sudden Cardiac Death.  ROS:  Please see the history of present illness.  ROS  All other ROS reviewed and negative.     Physical Exam/Data:   Vitals:   12/01/16 2037 12/01/16 2138 12/02/16 0500 12/02/16 0843  BP: (!) 141/64 (!) 155/63 (!) 157/71 (!) 145/77  Pulse: 64 64 60   Resp: 18 (!) 23 18   Temp: 98.5 F (36.9 C)  (!) 97.3 F (36.3 C)   TempSrc: Oral  Oral   SpO2: 98% 97% 96%   Weight:   112 lb 6.4 oz (51 kg)   Height:        Intake/Output Summary (Last 24 hours) at 12/02/16 1125 Last data filed at 12/02/16 0906  Gross per  24 hour  Intake              480 ml  Output              900 ml  Net             -420 ml   Filed Weights   11/30/16 2230 12/01/16 0500 12/02/16 0500  Weight: 114 lb 14.4 oz (52.1 kg) 114 lb 8 oz (51.9 kg)  112 lb 6.4 oz (51 kg)   Body mass index is 22.7 kg/m.  General:  Well nourished, well developed, body habitus is thin, in no acute distress, though verbally tells me she is feeling poorly HEENT: normal Lymph: no adenopathy Neck: ++ JVD Endocrine:  No thryomegaly Vascular: No carotid bruits; FA pulses 2+ bilaterally without bruits  Cardiac:  IRRR; 1+ SM Lungs:  Decreased at the bases b/l, no wheezing, rhonchi or rales  Abd: soft, nontender, no hepatomegaly  Ext:  2+ edema b/l Musculoskeletal:  Age appropriate atrophy Skin: warm and dry  Neuro:  CNs 2-12 intact, no focal abnormalities noted Psych:  Normal affect   EKG:  The EKG was personally reviewed and demonstrates:   #1 SR 65bpm, PR 131ms, QRS 8ms, QTc 452ms #2 SR 69bpm, PR 160ms, QRS 74ms, QTc 454ms Telemetry:  Telemetry was personally reviewed and demonstrates:   SR 60's, this morning converted to AFib 90's-100 range with a few post termination pauses longest 4.75 seconds.  Currently remains in AF 90's   Relevant CV Studies:  07/14/16: TTE Study Conclusions - Procedure narrative: Transthoracic echocardiography. Image   quality was adequate. The study was technically difficult, as a   result of restricted patient mobility. - Left ventricle: The cavity size was normal. Wall thickness was   increased in a pattern of moderate LVH. Systolic function was   normal. The estimated ejection fraction was in the range of 60%   to 65%. Wall motion was normal; there were no regional wall   motion abnormalities. Doppler parameters are consistent with   abnormal left ventricular relaxation (grade 1 diastolic   dysfunction). The E/e&' ratio is >15, suggesting elevated LV   filling pressure. - Aortic valve: Trileaflet. Sclerosis  without stenosis. There was   no regurgitation. - Mitral valve: Calcified annulus. Mildly thickened leaflets . No   significant inflow respiratory variation. There was mild   regurgitation. - Left atrium: Severely dilated. (62mm) - Atrial septum: No defect or patent foramen ovale was identified. - Systemic veins: The IVC measures >2.1 cm, but collapses >50%,   suggesting an elevated RA pressure of 8 mmHg. - Pericardium, extracardiac: Small posterior pericardial effusion.   No tamponade features. Impressions: - Compared to the prior study in 2017, the LVEF is higher at   60-65%. There is moderate LVH. The left atrium is severely   dilated. A small posterior pericardial effusion is noted without   tamponade features. There is diastolic dysfunction with elevated   left and right heart pressures.   Nuclear Stress Test 01/31/2016 IMPRESSION: 1. No reversible ischemia or infarction. 2. Normal left ventricular wall motion. 3. Left ventricular ejection fraction 56% 4. Non invasive risk stratification*: Low  Laboratory Data:  Chemistry Recent Labs Lab 11/30/16 1331 12/01/16 0229 12/02/16 0305  NA 130* 132* 134*  K 3.9 3.5 3.2*  CL 92* 95* 95*  CO2 24 25 27   GLUCOSE 123* 97 114*  BUN 93* 95* 91*  CREATININE 4.21* 3.95* 3.23*  CALCIUM 9.1 8.7* 8.3*  GFRNONAA 9* 10* 12*  GFRAA 10* 11* 14*  ANIONGAP 14 12 12     Hematology Recent Labs Lab 11/30/16 1331 12/01/16 0229 12/02/16 0305  WBC 7.0 7.0 7.1  RBC 3.81* 3.56* 3.54*  HGB 11.3* 10.6* 10.7*  HCT 34.5* 32.3* 32.5*  MCV 90.6 90.7 91.8  MCH 29.7 29.8 30.2  MCHC 32.8 32.8 32.9  RDW 13.6 13.5 13.8  PLT 246 234 247   Cardiac Enzymes Recent Labs Lab 11/30/16  1331 11/30/16 2033 12/01/16 0229  TROPONINI 0.11* 0.09* 0.09*   No results for input(s): TROPIPOC in the last 168 hours.  BNP Recent Labs Lab 11/30/16 1331  BNP >4,500.0*     Radiology/Studies:  Dg Chest 2 View Result Date: 11/30/2016 CLINICAL DATA:  83  y/o  F; shortness of breath. EXAM: CHEST  2 VIEW COMPARISON:  08/05/2016 chest radiograph FINDINGS: Stable moderate cardiomegaly. Aortic atherosclerosis with calcification. Stable small left pleural effusion. Stable left lung base opacity. Interstitial pulmonary edema. Increased perihilar haziness probably represents developing alveolar edema. Midthoracic compression deformity post kyphoplasty. IMPRESSION: Interstitial and mild alveolar pulmonary edema. Small left pleural effusion with left basilar opacity probably representing associated atelectasis. Stable cardiomegaly. Electronically Signed   By: Kristine Garbe M.D.   On: 11/30/2016 17:57    Assessment and Plan:   1. Paroxysmal AFib     CAH2DS2Vasc is 6, on Eliquis out patient, heparin gtt here     Post termination pauses this AM, her sinus rates 60's, no baseline conduction system disease, no brady events that I saw on telemetry      May consider AAD to maintain SR, though options are limited, and likely only amiodarone vs pacing     Patient definitely feels worse in AF and reports brief episodes of feeling lightheaded this morning (though while I am talking to her she has one without pausing  Maintaining SR in acute CHF exacerbation  and last echo with severely dilated LA may be difficult, though AF will likely make CHF management more difficult.  I am inclined to initiate amiodarone, will discuss with Dr. Lovena Le.  Agree with heparin, she has hx of embolic event/CVA when off a/c historically, record indicate she was on xarelto after a orthostatic event/syncope suffered a SAH and maintained on ASA, subsequently suffered a CVA >> Eliquis  2.  Acute chronic CHF (diastolic)      C/w primary cardiology team  3. CRI     Follows with nephrology out patient     Stage III  4. HTN     Looks OK  5. CP     Sharp, worse with inspiration/certain movements (with/without the AF)     Cardiology felt Trop flat/and likely 2/2 CHF and AKI/CRI      Pending echo for further evaluation   Signed, Baldwin Jamaica, PA-C  12/02/2016 11:25 AM  EP Attending  Patient seen and examined. Agree with above except that she is now having pauses of up to 6 seconds due to sinus node dysfunction due to post-termination pauses. The patient will likely need systemic anti-coagulation with warfarin and treatment of her atrial fib with amiodarone as her kidney function will not allow for an [place]. She will be off of IV heparin for 48 hours after PPM. I would anticipate amiodarone for control of her atrial fib and would anticipate adding a beta blocker after her PPM in in place.  Mikle Bosworth.D.

## 2016-12-03 NOTE — Progress Notes (Signed)
PT Cancellation Note  Patient Details Name: Valerie Terrell MRN: [id number] DOB: 1932/06/10   Cancelled Treatment:    Reason Eval/Treat Not Completed: Patient at procedure or test/unavailable.   Valerie Terrell 12/03/2016, 12:45 PM [employer] PT [phone]

## 2016-12-03 NOTE — Care Management Note (Addendum)
Case Management Note  Patient Details  Name: Valerie Terrell MRN: [id number] Date of Birth: [birth year]  Subjective/Objective:  Pt presented for shortness of breath and weakness. Acute on Chronic CHF- Initiated on IV Lasix. Post PPM- PTA independent and still driving. Pt states she lives with daughter and uses RW occasionally.                  Action/Plan: CM will continue to monitor for additional needs.   Expected Discharge Date:                  Expected Discharge Plan:  Home/Self Care  In-House Referral:  NA  Discharge planning Services  CM Consult  Post Acute Care Choice:  Home Health Choice offered to:  Patient  DME Arranged:  N/A DME Agency:  NA  HH Arranged:  PT, OT HH Agency:  [facility]  Status of Service:  Completed, signed off  If discussed at [place] of Stay Meetings, dates discussed:    Additional Comments: 1007 12-05-16 Jacqlyn Krauss, RN,BSN [phone] CM did speak with pt in regards to disposition needs. PT/OT recommendations for [facility]- Pt is agreeable to services- CM did offer agency list for choice and pt chose [facility]. CM did provide referral to [facility] with [place]. SOC to begin within 24-48 hours post d/c. HH PT/OT orders will need to be written prior to d/c. CM will continue to monitor for 02 needs.  Bethena Roys, RN 12/03/2016, 3:58 PM

## 2016-12-03 NOTE — Plan of Care (Signed)
Problem: Safety: Goal: Ability to remain free from injury will improve Outcome: Progressing Fall risk bundle remains in place. No injuries, falls or skin break down this shift.

## 2016-12-03 NOTE — Progress Notes (Signed)
PHARMACY NOTE:  ANTIMICROBIAL RENAL DOSAGE ADJUSTMENT  Current antimicrobial regimen includes a mismatch between antimicrobial dosage and estimated renal function.  As per policy approved by the Pharmacy & Therapeutics and Medical Executive Committees, the antimicrobial dosage will be adjusted accordingly.  Current antimicrobial dosage:  Cefazolin 1gm IV q6h x 3 doses post-device insertion  Indication: surgical prophylaxis  Renal Function:  Estimated Creatinine Clearance: 9.5 mL/min (A) (by C-G formula based on SCr of 3.17 mg/dL (H)). []      On intermittent HD, scheduled: []      On CRRT    Antimicrobial dosage has been changed to:  Cefazolin 1 gm IV x 1 dose 12 hrs after procedure   Thank you for allowing pharmacy to be a part of this patient's care.  Arty Baumgartner, [facility]  Pager: [phone] 12/03/2016 10:19 AM

## 2016-12-03 NOTE — Progress Notes (Signed)
PROGRESS NOTE  Valerie Terrell  NOM:[id number] DOB: 1932-08-04 DOA: 11/30/2016 PCP: Tammi Sou, MD   Brief Narrative: Valerie Terrell is a 83 y.o. female with medical history significant of left kidney atrophy, retinal artery occlusion in the left eye, stage III chronic renal insufficiency, history of subarachnoid hemorrhage, hyperlipidemia, hypertension, lumbar spondylosis, paroxysmal atrial fibrillation on apixaban who presented to the ED 7/16 with progressively worse dyspnea, lower extremity edema, orthopnea and weakness for the past 2 weeks, but particularly worse in over the weekend. She has also had mild nausea, occasional palpitations and dizziness. Initial vital signs in the emergency department temperature 97.69F, pulse 63, blood pressure 155/67 mmHg, respirations 22 and O2 sat 93% on room air. Her WBC is 7.0, hemoglobin 11.3 g/dL and platelets 246. Her troponin level was 0.11, BNP over 4500, sodium 130, potassium 3.9, chloride 92, bicarbonate 24 mmol/L. Her BUN was 93, creatinine 4.21 up from 2.4 a week ago. Her chest radiograph shows interstitial and mild alveolar edema with left pleural effusion.  IV lasix was administered and the patient was admitted for ongoing diuresis.   Assessment & Plan: Principal Problem:   Pulmonary edema Active Problems:   PAF (paroxysmal atrial fibrillation) (HCC)   Essential hypertension   Acute renal failure superimposed on chronic kidney disease (HCC)   Hyperlipidemia   Elevated troponin level   Anemia   Acute respiratory failure with hypoxia (HCC)  Acute hypoxic respiratory failure due to pulmonary edema: Due to volume overload from acute renal failure. BNP >4500.  - Continue supplemental oxygen prn - Will repeat echo with elevated troponin and uncertain precipitant of overload. Last echo showed diastolic dysfunction, EF 96-28%, with elevated left and right heart pressures.  - Diurese as below  Tachy-Brady Syndrome - Pt had a pacemaker placed  today 12/03/16.  She is now recovering from procedure, post op mgmt per Psychologist, sport and exercise.   Acute renal failure superimposed on stage III CKD: Follows with Dr. Florene Glen as outpatient. SCr 2.51 at recent discharge for similar presentation, up to 4.21 on admission. Uncertain cause, though she may not be taking diuretics as directed.  - Lasix 80mg  IV BID, holding spironolactone for now. - Monitor BMP and UOP. Creatinine improved from admission.    Elevated troponin: Flat trend, likely demand ischemia with decreased clearance, though pt is having chest pain. ECG appears nonischemic.  - cardiology following.   Essential hypertension - Continue amlodipine (5mg  BID), hydralazine 100mg  TID, atenolol 100mg . Chlorthalidone stopped at last admission in March 2018. - Above goal, titrate as indicated with diuresis.   PAF: with documented pauses  - CHA2DS2-VASc Scoreat least 5.  - Continue atenolol, currently NSR - On IV heparin during acute renal failure (held currently due to nose bleeding)   Hyperlipidemia: Chronic, stable.  - Continue statin  Anemia of chronic disease:  - monitor CBC   DVT prophylaxis: Heparin IV Code Status: Full Family Communication: Daughter at bedside  Disposition Plan: TBD  PT/OT eval pending.   Consultants:   Cardiology  Procedures:   None  Antimicrobials:  None   Subjective: Pt resting, recovering from procedure this morning. No complaints at the moment.    Objective: Vitals:   12/03/16 0829 12/03/16 0906 12/03/16 0921 12/03/16 0936  BP: 133/68 (!) 152/71 (!) 156/71 (!) 150/72  Pulse: (!) 59 60 64 61  Resp: 11 15 12 11   Temp:      TempSrc:      SpO2: 100% 96% 98% 96%  Weight:  Height:        Intake/Output Summary (Last 24 hours) at 12/03/16 1250 Last data filed at 12/02/16 2108  Gross per 24 hour  Intake              360 ml  Output              200 ml  Net              160 ml   Filed Weights   12/01/16 0500 12/02/16 0500 12/03/16 0400    Weight: 51.9 kg (114 lb 8 oz) 51 kg (112 lb 6.4 oz) 51.7 kg (113 lb 14.4 oz)   General exam: 83 y.o. female sitting up in bed, mild discomfort Respiratory system: Tachypneic on 3L by [place] without accessory muscle use. Crackles noted bilaterally Cardiovascular system: normal s1, s2 sounds. No murmur, rub, or gallop. No JVD, and 2+ pitting LE edema. Gastrointestinal system: Abdomen soft, non-tender, non-distended, with normoactive bowel sounds. No organomegaly or masses felt. Central nervous system: Alert and oriented. No focal neurological deficits. Extremities: Warm, no deformities.  Skin: No rashes, lesions no ulcers.   Psychiatry: Judgement and insight appear normal. Mood & affect appropriate.   Data Reviewed: I have personally reviewed following labs and imaging studies  CBC:   Recent Labs Lab 11/30/16 1331 12/01/16 0229 12/02/16 0305 12/03/16 0155  WBC 7.0 7.0 7.1 9.3  HGB 11.3* 10.6* 10.7* 10.5*  HCT 34.5* 32.3* 32.5* 33.4*  MCV 90.6 90.7 91.8 92.0  PLT 246 234 247 111   Basic Metabolic Panel:  Recent Labs Lab 11/30/16 1331 12/01/16 0229 12/02/16 0305 12/03/16 0155  NA 130* 132* 134* 133*  K 3.9 3.5 3.2* 4.5  CL 92* 95* 95* 96*  CO2 24 25 27 24   GLUCOSE 123* 97 114* 102*  BUN 93* 95* 91* 88*  CREATININE 4.21* 3.95* 3.23* 3.17*  CALCIUM 9.1 8.7* 8.3* 8.4*   GFR: Estimated Creatinine Clearance: 9.5 mL/min (A) (by C-G formula based on SCr of 3.17 mg/dL (H)).  Cardiac Enzymes:  Recent Labs Lab 11/30/16 1331 11/30/16 2033 12/01/16 0229  TROPONINI 0.11* 0.09* 0.09*   CBG:  Recent Labs Lab 11/30/16 1617 12/03/16 0646  GLUCAP 125* 104*   Urine analysis:    Component Value Date/Time   COLORURINE YELLOW 11/30/2016 1610   APPEARANCEUR CLEAR 11/30/2016 1610   LABSPEC 1.013 11/30/2016 1610   PHURINE 5.0 11/30/2016 1610   GLUCOSEU NEGATIVE 11/30/2016 1610   HGBUR NEGATIVE 11/30/2016 1610   BILIRUBINUR NEGATIVE 11/30/2016 1610   BILIRUBINUR negative  09/24/2016 [place] 11/30/2016 1610   PROTEINUR 30 (A) 11/30/2016 1610   UROBILINOGEN 0.2 09/24/2016 1453   NITRITE NEGATIVE 11/30/2016 1610   LEUKOCYTESUR SMALL (A) 11/30/2016 1610   Radiology Studies: No results found.  LOS: 3 days    Time spent: 25 minutes  Irwin Brakeman, MD [facility] Pager [phone]  If 7PM-7AM, please contact night-coverage [email or web address] Password [email or web address] 12/03/2016, 12:50 PM

## 2016-12-03 NOTE — Plan of Care (Signed)
Problem: Safety: Goal: Ability to remain free from injury will improve Outcome: Progressing Bed alarm in use, personal items within reach, patient agrees to call staff for assistance out of bed.

## 2016-12-03 NOTE — Interval H&P Note (Signed)
History and Physical Interval Note:  12/03/2016 6:37 AM  Valerie Terrell  has presented today for surgery, with the diagnosis of tachy/brady syndrome  The various methods of treatment have been discussed with the patient and family. After consideration of risks, benefits and other options for treatment, the patient has consented to  Procedure(s): Pacemaker Implant (N/A) as a surgical intervention .  The patient's history has been reviewed, patient examined, no change in status, stable for surgery.  I have reviewed the patient's chart and labs.  Questions were answered to the patient's satisfaction.     Cristopher Peru

## 2016-12-04 ENCOUNTER — Inpatient Hospital Stay (HOSPITAL_COMMUNITY): Payer: Medicare Other

## 2016-12-04 DIAGNOSIS — Z950 Presence of cardiac pacemaker: Secondary | ICD-10-CM | POA: Diagnosis not present

## 2016-12-04 DIAGNOSIS — J811 Chronic pulmonary edema: Secondary | ICD-10-CM

## 2016-12-04 LAB — ECHOCARDIOGRAM COMPLETE
AOASC: 24 cm
CHL CUP MV DEC (S): 148
CHL CUP TV REG PEAK VELOCITY: 285 cm/s
EWDT: 148 ms
FS: 23 % — AB (ref 28–44)
Height: 59 in
IVS/LV PW RATIO, ED: 0.86
LA diam index: 2.8 cm/m2
LA vol: 58.4 mL
LASIZE: 41 mm
LAVOLA4C: 50.3 mL
LAVOLIN: 39.9 mL/m2
LDCA: 2.54 cm2
LEFT ATRIUM END SYS DIAM: 41 mm
LV PW d: 14 mm — AB (ref 0.6–1.1)
LVOT diameter: 18 mm
MV pk A vel: 58 m/s
MV pk E vel: 104 m/s
MVAP: 5.12 cm2
MVPG: 4 mmHg
MVSPHT: 43 ms
RV sys press: 47 mmHg
TAPSE: 13.4 mm
TRMAXVEL: 285 cm/s
Weight: 1798.4 oz

## 2016-12-04 LAB — PROTIME-INR
INR: 1.14
Prothrombin Time: 14.6 seconds (ref 11.4–15.2)

## 2016-12-04 LAB — BASIC METABOLIC PANEL
ANION GAP: 12 (ref 5–15)
BUN: 89 mg/dL — AB (ref 6–20)
CALCIUM: 8.5 mg/dL — AB (ref 8.9–10.3)
CO2: 27 mmol/L (ref 22–32)
CREATININE: 3.21 mg/dL — AB (ref 0.44–1.00)
Chloride: 97 mmol/L — ABNORMAL LOW (ref 101–111)
GFR calc Af Amer: 15 mL/min — ABNORMAL LOW (ref >60)
GFR calc non Af Amer: 13 mL/min — ABNORMAL LOW (ref >60)
GLUCOSE: 113 mg/dL — AB (ref 65–99)
Potassium: 4.8 mmol/L (ref 3.5–5.1)
Sodium: 136 mmol/L (ref 135–145)

## 2016-12-04 LAB — ALBUMIN: Albumin: 2.8 g/dL — ABNORMAL LOW (ref 3.5–5.0)

## 2016-12-04 MED ORDER — WARFARIN SODIUM 2.5 MG PO TABS
2.5000 mg | ORAL_TABLET | Freq: Once | ORAL | Status: AC
  Administered 2016-12-04: 2.5 mg via ORAL
  Filled 2016-12-04: qty 1

## 2016-12-04 MED ORDER — POTASSIUM CHLORIDE CRYS ER 20 MEQ PO TBCR
20.0000 meq | EXTENDED_RELEASE_TABLET | Freq: Every day | ORAL | Status: DC
  Administered 2016-12-04 – 2016-12-06 (×3): 20 meq via ORAL
  Filled 2016-12-04 (×3): qty 1

## 2016-12-04 MED ORDER — SENNA 8.6 MG PO TABS: 1.0000 | ORAL_TABLET | Freq: Every day | ORAL | Status: DC | PRN

## 2016-12-04 MED ORDER — FUROSEMIDE 10 MG/ML IJ SOLN
120.0000 mg | Freq: Three times a day (TID) | INTRAVENOUS | Status: DC
  Administered 2016-12-04 – 2016-12-06 (×6): 120 mg via INTRAVENOUS
  Filled 2016-12-04 (×3): qty 10
  Filled 2016-12-04 (×2): qty 12
  Filled 2016-12-04 (×2): qty 10

## 2016-12-04 MED ORDER — CARVEDILOL 3.125 MG PO TABS
3.1250 mg | ORAL_TABLET | Freq: Two times a day (BID) | ORAL | Status: DC
  Administered 2016-12-04 – 2016-12-07 (×6): 3.125 mg via ORAL
  Filled 2016-12-04 (×7): qty 1

## 2016-12-04 MED ORDER — PATIENT'S GUIDE TO USING COUMADIN BOOK
Freq: Once | Status: DC
  Filled 2016-12-04: qty 1

## 2016-12-04 MED ORDER — MORPHINE SULFATE (PF) 2 MG/ML IV SOLN: 1.0000 mg | INTRAVENOUS | Status: DC | PRN

## 2016-12-04 MED ORDER — WARFARIN - PHARMACIST DOSING INPATIENT: Freq: Every day | Status: DC

## 2016-12-04 NOTE — Progress Notes (Signed)
PT Cancellation Note  Patient Details Name: [place] Huberty MRN: [id number] DOB: [birth year]   Cancelled Treatment:    Reason Eval/Treat Not Completed: Other (comment). Pt again reports too tired due to lack of sleep. Will try again later in afternoon as time allows.    [place] 12/04/2016, 11:27 AM  Suanne Marker PT [phone]

## 2016-12-04 NOTE — Evaluation (Addendum)
Occupational Therapy Evaluation Patient Details Name: Valerie Terrell MRN: [id number] DOB: Mar 12, 1933 Today's Date: 12/04/2016    History of Present Illness  Valerie Terrell is a 83 y.o. female with medical history significant of left kidney atrophy, retinal artery occlusion in the left eye, stage IV chronic renal insufficiency, history of subarachnoid hemorrhage, hyperlipidemia, hypertension, lumbar spondylosis, paroxysmal atrial fibrillation on apixaban who is coming to the emergency department with complaints of progressively worse dyspnea, lower extremity edema, orthopnea and weakness for the past 2 weeks   Clinical Impression   Pt was assisted for LB dressing, walked with a cane and was dependent in all IADL prior to admission. She and her daughter live together. She required encouragement to get OOB with therapies today and required min guard assist with RW for mobility. Pt is agreeable to further therapy at home. Will follow acutely.    Follow Up Recommendations  Home health OT    Equipment Recommendations  None recommended by OT    Recommendations for Other Services       Precautions / Restrictions Precautions Precautions: Fall Restrictions Weight Bearing Restrictions: No  Pacemaker precautions     Mobility Bed Mobility Overal bed mobility: Needs Assistance Bed Mobility: Supine to Sit;Sit to Supine     Supine to sit: Supervision;HOB elevated Sit to supine: Min guard   General bed mobility comments: increased time and use of rail, min guard for LEs back into bed  Transfers Overall transfer level: Needs assistance Equipment used: Rolling walker (2 wheeled) Transfers: Sit to/from Stand Sit to Stand: Min guard              Balance Overall balance assessment: Needs assistance   Sitting balance-Leahy Scale: Fair       Standing balance-Leahy Scale: Poor Standing balance comment: able to release walker with one hand in static standing                            ADL either performed or assessed with clinical judgement   ADL Overall ADL's : Needs assistance/impaired Eating/Feeding: Independent;Bed level   Grooming: Set up;Sitting Grooming Details (indicate cue type and reason): pt unable/unwilling to perform grooming in standing Upper Body Bathing: Minimal assistance;Sitting   Lower Body Bathing: Minimal assistance;Sit to/from stand   Upper Body Dressing : Set up;Sitting   Lower Body Dressing: Moderate assistance;Sit to/from stand   Toilet Transfer: Min guard;Ambulation;RW   Toileting- Water quality scientist and Hygiene: Min guard;Sit to/from stand       Functional mobility during ADLs: Min guard;Rolling walker General ADL Comments: pt wears compression hose     Vision Baseline Vision/History: Wears glasses Wears Glasses: At all times Patient Visual Report: No change from baseline       Perception     Praxis      Pertinent Vitals/Pain Pain Assessment: Faces Faces Pain Scale: No hurt     Hand Dominance Right   Extremity/Trunk Assessment Upper Extremity Assessment Upper Extremity Assessment: Generalized weakness   Lower Extremity Assessment Lower Extremity Assessment: Defer to PT evaluation       Communication Communication Communication: No difficulties   Cognition Arousal/Alertness: Awake/alert Behavior During Therapy: Flat affect Overall Cognitive Status: Within Functional Limits for tasks assessed                                 General Comments: pt with low motivation, requires encouragement for OOB  General Comments       Exercises     Shoulder Instructions      Home Living Family/patient expects to be discharged to:: Private residence Living Arrangements: Children (daughter) Available Help at Discharge: Family;Available 24 hours/day (daughter works from home) Type of Home: House Home Access: Stairs to enter Technical brewer of Steps: 3-4 Entrance Stairs-Rails:  None Home Layout: One level     Bathroom Shower/Tub: Teacher, early years/pre: Handicapped height     Home Equipment: Environmental consultant - 2 wheels;Cane - quad;Bedside commode          Prior Functioning/Environment Level of Independence: Needs assistance  Gait / Transfers Assistance Needed: ambulating with cane ADL's / Homemaking Assistance Needed: assisted for LB dressing and all IADL   Comments: pt has not driven in several months        OT Problem List: Decreased strength;Decreased activity tolerance;Impaired balance (sitting and/or standing);Decreased safety awareness;Decreased knowledge of use of DME or AE      OT Treatment/Interventions: Self-care/ADL training;DME and/or AE instruction;Therapeutic activities;Patient/family education;Balance training;Energy conservation    OT Goals(Current goals can be found in the care plan section) Acute Rehab OT Goals Patient Stated Goal: to return home with her daughter's help and with 02 OT Goal Formulation: With patient Time For Goal Achievement: 12/18/16 Potential to Achieve Goals: Good ADL Goals Pt Will Perform Grooming: with supervision;standing (2 activities) Pt Will Perform Lower Body Dressing: with min assist;sit to/from stand;with adaptive equipment Pt Will Transfer to Toilet: with supervision;ambulating Additional ADL Goal #1: Pt will utilize energy conservation strategies during ADL independently. Additional ADL Goal #2: Pt will gather items for ADL around room with RW and supervision.  OT Frequency: Min 2X/week   Barriers to D/C:            Co-evaluation PT/OT/SLP Co-Evaluation/Treatment: Yes Reason for Co-Treatment: Other (comment) (pt with poor endurance for 2 sessions)   OT goals addressed during session: ADL's and self-care      AM-PAC PT "6 Clicks" Daily Activity     Outcome Measure Help from another person eating meals?: None Help from another person taking care of personal grooming?: A Little Help  from another person toileting, which includes using toliet, bedpan, or urinal?: A Little Help from another person bathing (including washing, rinsing, drying)?: A Little Help from another person to put on and taking off regular upper body clothing?: None Help from another person to put on and taking off regular lower body clothing?: A Lot 6 Click Score: 19   End of Session Equipment Utilized During Treatment: Gait belt;Rolling walker;Oxygen (3L)  Activity Tolerance: Patient limited by fatigue Patient left: in bed;with call bell/phone within reach  OT Visit Diagnosis: Unsteadiness on feet (R26.81);Muscle weakness (generalized) (M62.81)                Time: 1062-6948 OT Time Calculation (min): 23 min Charges:  OT General Charges $OT Visit: 1 Procedure OT Evaluation $OT Eval Moderate Complexity: 1 Procedure G-Codes:     Malka So 12/04/2016, 3:59 PM  [phone]

## 2016-12-04 NOTE — Care Management Important Message (Signed)
Important Message  Patient Details  Name: Valerie Terrell MRN: [id number] Date of Birth: 12/15/1932   Medicare Important Message Given:  Yes    [name] 12/04/2016, 10:27 AM

## 2016-12-04 NOTE — Progress Notes (Signed)
Progress Note  Patient Name: Valerie Terrell Date of Encounter: 12/04/2016  Primary Cardiologist: Dr. Gwenlyn Found  Subjective   Nauseous this morning, c/w back pain that is positional/musculoskeletal sounding, no CP or SOB  Inpatient Medications    Scheduled Meds: . amLODipine  5 mg Oral BID WC  . feeding supplement (ENSURE ENLIVE)  237 mL Oral BID BM  . furosemide  80 mg Intravenous BID  . hydrALAZINE  100 mg Oral TID  . mouth rinse  15 mL Mouth Rinse BID  . potassium chloride  40 mEq Oral Daily  . pravastatin  40 mg Oral QPM  . vitamin C  500 mg Oral Daily  . Warfarin - Physician Dosing Inpatient   Does not apply q1800   Continuous Infusions:  PRN Meds: acetaminophen, morphine injection, ondansetron (ZOFRAN) IV, ondansetron, oxyCODONE-acetaminophen, phenol   Vital Signs    Vitals:   12/03/16 1800 12/03/16 2047 12/04/16 0400 12/04/16 0741  BP: (!) 141/54 (!) 141/60 (!) 153/68 (!) 160/72  Pulse: 70 65 65 65  Resp: 18 16 18 18   Temp: 98 F (36.7 C) 98.4 F (36.9 C) 97.7 F (36.5 C) 97.9 F (36.6 C)  TempSrc: Oral Oral Oral Oral  SpO2: 97% 99% 98% 98%  Weight:   113 lb 14.4 oz (51.7 kg)   Height:        Intake/Output Summary (Last 24 hours) at 12/04/16 0829 Last data filed at 12/04/16 0600  Gross per 24 hour  Intake              290 ml  Output              650 ml  Net             -360 ml   Filed Weights   12/02/16 0500 12/03/16 0400 12/04/16 0400  Weight: 112 lb 6.4 oz (51 kg) 113 lb 14.4 oz (51.7 kg) 113 lb 14.4 oz (51.7 kg)    Telemetry    SR, rarely A paced - Personally Reviewed  ECG    SR, 65bpm - Personally Reviewed  Physical Exam   GEN: No acute distress.   Neck: No JVD Cardiac: RRR, no murmurs, rubs, or gallops.  Respiratory: diminished at the bases. GI: Soft, nontender, non-distended  MS: edema persists; No deformity. Neuro:  Nonfocal  Psych: Normal affect   PPM implant site is stable, no hematoma, minimal ecchymosis, slight skin  irritation from dressing  Labs    Chemistry Recent Labs Lab 12/02/16 0305 12/03/16 0155 12/04/16 0325  NA 134* 133* 136  K 3.2* 4.5 4.8  CL 95* 96* 97*  CO2 27 24 27   GLUCOSE 114* 102* 113*  BUN 91* 88* 89*  CREATININE 3.23* 3.17* 3.21*  CALCIUM 8.3* 8.4* 8.5*  GFRNONAA 12* 13* 13*  GFRAA 14* 15* 15*  ANIONGAP 12 13 12      Hematology Recent Labs Lab 12/01/16 0229 12/02/16 0305 12/03/16 0155  WBC 7.0 7.1 9.3  RBC 3.56* 3.54* 3.63*  HGB 10.6* 10.7* 10.5*  HCT 32.3* 32.5* 33.4*  MCV 90.7 91.8 92.0  MCH 29.8 30.2 28.9  MCHC 32.8 32.9 31.4  RDW 13.5 13.8 13.6  PLT 234 247 285    Cardiac Enzymes Recent Labs Lab 11/30/16 1331 11/30/16 2033 12/01/16 0229  TROPONINI 0.11* 0.09* 0.09*   No results for input(s): TROPIPOC in the last 168 hours.   BNP Recent Labs Lab 11/30/16 1331  BNP >4,500.0*     DDimer No  results for input(s): DDIMER in the last 168 hours.   Radiology    Dg Chest 2 View Result Date: 12/04/2016 CLINICAL DATA:  Pacemaker placement EXAM: CHEST  2 VIEW COMPARISON:  11/30/2016 FINDINGS: The heart is mildly enlarged. Double lead left subclavian pacemaker device has been placed. Tips of the leads are in the right atrium and right ventricle. There is no ensuing pneumothorax. Patchy right lower lung zone opacities are stable. Left pleural effusion is small and stable. Linear opacities at the left base are stable. Osteopenia. Mid-level thoracic vertebroplasty changes. There are compression fractures in both vertebral bodies about the augmented mid-level thoracic vertebral body. These bony findings are stable. IMPRESSION: Left subclavian pacemaker device placement without complication or pneumothorax. Stable right basilar pulmonary opacities Stable left pleural effusion and basilar atelectasis for scarring. Electronically Signed   By: Marybelle Killings M.D.   On: 12/04/2016 07:50    Cardiac Studies   12/02/16: TTE Study Conclusions - Left ventricle: The  cavity size was normal. There was moderate   concentric hypertrophy. Systolic function was normal. Wall motion   was normal; there were no regional wall motion abnormalities. The   study was not technically sufficient to allow evaluation of LV   diastolic dysfunction due to atrial fibrillation. - Aortic valve: Valve mobility was restricted. Transvalvular   velocity was within the normal range. There was no stenosis.   There was no regurgitation. - Mitral valve: There was mild regurgitation. - Left atrium: The atrium was mildly dilated. - Right ventricle: The cavity size was moderately dilated. Wall   thickness was normal. Systolic function was moderately reduced. - Tricuspid valve: There was mild regurgitation. - Pulmonary arteries: Systolic pressure was moderately increased.   PA peak pressure: 47 mm Hg (S). - Inferior vena cava: The vessel was normal in size. - Pericardium, extracardiac: A trivial pericardial effusion was   identified posterior to the heart. Features were not consistent   with tamponade physiology. Impressions: - When compared to the prior study from 07/14/2016 LVEF has   decreased from 60-65% to 35-40%. RV is moderately dilated with   moderately decreased RVEF. There is RV strain. Consider workup   for pulmonary embolism.   The study acquired while the patient in atrial fibrillation with   RVR.  Patient Profile     83 y.o. female with a hx of atrophy of the left kidney (absent blood flow by renal artery doppler, Dr. Gwenlyn Found), HTN, CRI (III), HLD, PAFib on a/c, record indciates hx of SAH who was admitted for acute/chronic CHF exacerbation while here had recurrent rapid AFib with prolonged and symptomatic post termination pauses, who is now s/p PPM implant yesterday.  Assessment & Plan    1. Paroxysmal AFib     CAH2DS2Vasc is 6, on Eliquis out patient, given renal function started on warfarin yesterday post PPM     She has hx of CVA when off a/c historically, had  been on heparin gtt and maintaining SR currently     PAFib w/RVR and post termination pauses now s/p PPM     Implant site is stable     Device check this morning with stable measurements and normal function     CXR this morning without ptx      PPM/EP follow up has been arranged     wound care and activity restrictions have been discussed with the patient/daughter at bedside       2.  Acute chronic CHF (diastolic)  I/O yesterday 290/650, cumulatively she is fluid negative -1018ml      cxr as noted above      Will defer on going management to primary cardiology team  3. CRI     Follows with nephrology out patient     Stage III  4. HTN     Looks OK  5. CP     Sharp, worse with inspiration/certain movements (with/without the AF)     Cardiology felt Trop flat/and likely 2/2 CHF and AKI/CRI      f/u echo was done with fast AF, no WMA, EF down, would consider repeating when in SR       Signed, Renee Dyane Dustman, PA-C  12/04/2016, 8:29 AM    EP Attending   Patient seen and examined. Agree with above. The patient is stable after PPM insertion. Her device is functioning normally. CXR with out PTX. Leads in place. Rhythm is NSR. I suspect the abnormal echo is related more to her atrial fib with RVR. From EP perspective she is stable for DC home with usual followup. Would consider adding amiodarone if atrial fib with RVR continues to be a problem. For now beta blocker and/or calcium channel blocker therapy for rate  control would be reasonable.  Mikle Bosworth.D.

## 2016-12-04 NOTE — Evaluation (Addendum)
Physical Therapy Evaluation Patient Details Name: Valerie Terrell MRN: [id number] DOB: Oct 24, 1932 Today's Date: 12/04/2016   History of Present Illness   Valerie Terrell is a 83 y.o. female with medical history significant of left kidney atrophy, retinal artery occlusion in the left eye, stage IV chronic renal insufficiency, history of subarachnoid hemorrhage, hyperlipidemia, hypertension, lumbar spondylosis, paroxysmal atrial fibrillation on apixaban who is coming to the emergency department with complaints of progressively worse dyspnea, lower extremity edema, orthopnea and weakness for the past 2 weeks. Pt with tachy brady and underwent pacer placement on 12/03/16.   Clinical Impression  Pt admitted with above diagnosis and presents to PT with functional limitations due to deficits listed below (See PT problem list). Pt needs skilled PT to maximize independence and safety to allow discharge to home with daughter. Pt requires much encouragement to mobilize. Pt not interested in going anywhere for further rehab but is agreeable to [facility] therapy. Expect pt should make good progress as long as she is willing to mobilize more.     Follow Up Recommendations Home health PT;Supervision - Intermittent    Equipment Recommendations  None recommended by PT    Recommendations for Other Services       Precautions / Restrictions Precautions Precautions: Fall Restrictions Weight Bearing Restrictions: No Other Position/Activity Restrictions: pacemaker precautions      Mobility  Bed Mobility Overal bed mobility: Needs Assistance Bed Mobility: Supine to Sit;Sit to Supine     Supine to sit: Supervision;HOB elevated Sit to supine: Min guard   General bed mobility comments: increased time and use of rail, min guard for LEs back into bed  Transfers Overall transfer level: Needs assistance Equipment used: Rolling walker (2 wheeled) Transfers: Sit to/from Stand Sit to Stand: Min guard          General transfer comment: Assist for safety and encouragement  Ambulation/Gait Ambulation/Gait assistance: Min guard Ambulation Distance (Feet): 25 Feet Assistive device: Rolling walker (2 wheeled) Gait Pattern/deviations: Step-through pattern;Decreased step length - left;Decreased step length - right Gait velocity: decr Gait velocity interpretation: Below normal speed for age/gender General Gait Details: Slow, guarded gait but no loss of balance. Pt amb on 3L of O2 with SpO2 96% after amb. Pt needed much encouragement to participate.  Stairs            Wheelchair Mobility    Modified Rankin (Stroke Patients Only)       Balance Overall balance assessment: Needs assistance Sitting-balance support: No upper extremity supported;Feet supported Sitting balance-Leahy Scale: Fair     Standing balance support: Single extremity supported Standing balance-Leahy Scale: Poor Standing balance comment: able to release walker with one hand in static standing                             Pertinent Vitals/Pain Pain Assessment: Faces Faces Pain Scale: No hurt    Home Living Family/patient expects to be discharged to:: Private residence Living Arrangements: Children (daughter) Available Help at Discharge: Family;Available 24 hours/day (daughter works from home) Type of Home: House Home Access: Stairs to enter Entrance Stairs-Rails: None Technical brewer of Steps: 3-4 Home Layout: One level Home Equipment: Environmental consultant - 2 wheels;Cane - quad;Bedside commode      Prior Function Level of Independence: Needs assistance   Gait / Transfers Assistance Needed: ambulating with cane  ADL's / Homemaking Assistance Needed: assisted for LB dressing and all IADL  Comments: pt has not driven in several months  Hand Dominance   Dominant Hand: Right    Extremity/Trunk Assessment   Upper Extremity Assessment Upper Extremity Assessment: Defer to OT evaluation    Lower  Extremity Assessment Lower Extremity Assessment: Generalized weakness       Communication   Communication: No difficulties  Cognition Arousal/Alertness: Awake/alert Behavior During Therapy: Flat affect Overall Cognitive Status: Within Functional Limits for tasks assessed                                 General Comments: pt with low motivation, requires encouragement for OOB      General Comments      Exercises     Assessment/Plan    PT Assessment Patient needs continued PT services  PT Problem List Decreased strength;Decreased activity tolerance;Decreased balance;Decreased mobility       PT Treatment Interventions DME instruction;Gait training;Functional mobility training;Therapeutic activities;Therapeutic exercise;Balance training;Patient/family education    PT Goals (Current goals can be found in the Care Plan section)  Acute Rehab PT Goals Patient Stated Goal: to return home with her daughter's help and with 02 PT Goal Formulation: With patient Time For Goal Achievement: 12/11/16 Potential to Achieve Goals: Good    Frequency Min 3X/week   Barriers to discharge        Co-evaluation PT/OT/SLP Co-Evaluation/Treatment: Yes Reason for Co-Treatment: Other (comment) (Pt with poor endurance and reluctance for 2 sessions)) PT goals addressed during session: Mobility/safety with mobility OT goals addressed during session: ADL's and self-care       AM-PAC PT "6 Clicks" Daily Activity  Outcome Measure Difficulty turning over in bed (including adjusting bedclothes, sheets and blankets)?: A Little Difficulty moving from lying on back to sitting on the side of the bed? : A Little Difficulty sitting down on and standing up from a chair with arms (e.g., wheelchair, bedside commode, etc,.)?: A Little Help needed moving to and from a bed to chair (including a wheelchair)?: A Little Help needed walking in hospital room?: A Little Help needed climbing 3-5 steps  with a railing? : A Little 6 Click Score: 18    End of Session Equipment Utilized During Treatment: Gait belt;Oxygen Activity Tolerance: Patient limited by fatigue;Other (comment) (self limiting) Patient left: in bed;with call bell/phone within reach   PT Visit Diagnosis: Unsteadiness on feet (R26.81);Muscle weakness (generalized) (M62.81);Other abnormalities of gait and mobility (R26.89)    Time: 7169-6789 PT Time Calculation (min) (ACUTE ONLY): 23 min   Charges:   PT Evaluation $PT Eval Moderate Complexity: 1 Procedure     PT G CodesMarland Kitchen        [facility] PT [place] 12/04/2016, 4:44 PM

## 2016-12-04 NOTE — Plan of Care (Signed)
Problem: Safety: Goal: Ability to remain free from injury will improve Outcome: Progressing Pt compliant with use of call light to ask for assistance

## 2016-12-04 NOTE — Consult Note (Signed)
Valerie Terrell Admit Date: 11/30/2016 12/04/2016 Valerie Terrell Requesting Physician:  Dr. Wynetta Terrell   Reason for Consult: AKI on CKD IV  HPI:  Patient is an 83 yo F with a pmhx of HTN, HLD, paroxsymal atrial fibrillation previously on Eliquis, atrophic left kidney, and CKD IV admitted 7/16 for acute on chronic CHF exacerbation. Patient presented with complaints of worsening dyspnea, lower extremity edema, orthopnea, and generalized weakness x 2 weeks. In the ED, BNP was elevated > 4500 and CXR consistent with pulmonary edema. Creatinine was elevated 4.2 from 2.4 one week prior. She was admitted for IV diuretics and initially responded well with improvement of BUN/Cr. She underwent repeat ECHO that showed new systolic dysfunction EF 62-83% (from 60-65% 06/2016), normal wall motion, and ? RV strain. Hospital course has been complicated by tachybrady syndrome now s/p pacemaker implantation 7/19. Over the past few days, Cr/BUN have remained unchanged and patient is no longer responding optimally to IV diuretics. UOP only 650 cc over the past 24 hours. Nephrology consulted for diuretic optimization.   PMH Incudes:  HTN  HLD  pAfib   SAH  Atrophic L Kidney   CKD IV    Creat (mg/dL)  Date Value  03/24/2016 1.63 (H)   Creatinine, Ser (mg/dL)  Date Value  12/04/2016 3.21 (H)  12/03/2016 3.17 (H)  12/02/2016 3.23 (H)  12/01/2016 3.95 (H)  11/30/2016 4.21 (H)  11/23/2016 2.40 (H)  11/09/2016 3.09 (H)  11/04/2016 2.87 (H)  10/19/2016 2.30 (H)  09/24/2016 2.04 (H)  ] I/Os:   Intake/Output Summary (Last 24 hours) at 12/04/16 1449 Last data filed at 12/04/16 1010  Gross per 24 hour  Intake              530 ml  Output              650 ml  Net             -120 ml    ROS NSAIDS: None IV Contrast None TMP/SMX None Hypotension None Balance of 12 systems is negative w/ exceptions as above  PMH  Past Medical History:  Diagnosis Date  . Atrophy of left kidney    with absent  blood flow by renal artery dopplers (Dr. Gwenlyn Terrell)  . 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 Chronic renal insufficiency, stage 4 (severe) (HCC)    Baseline GFR 25 ml/min-- renal u/s showed atrophic/hypoplastic left kidney.  No hydronephrosis.  Small right renal cyst.  . History of adenomatous polyp of colon   . History of subarachnoid hemorrhage 10/2014   after syncope and while on xarelto  . Hyperlipidemia   . Hypertension    Difficult to control, in the setting of one functioning kidney: pt was referred to nephrology by Dr. Gwenlyn Terrell 06/2015.  . Lumbar radiculopathy 2012  . Lumbar spondylosis    MR 07/2016---no sign of spinal nerve compression or cord compression.  Pt set up with outpt ortho while admitted to hosp 07/2016.  . Metatarsal fracture 06/10/2016   Nondisplaced, left 5th metatarsal--pt was referred to ortho  . Osteopenia 2014   T-score -2.1  . PAF (paroxysmal atrial fibrillation) (HCC)    Eliquis started after BRAO and CVA  . Pulmonary edema 11/2016  . Stroke [facility])    cardioembolic (had CVA while on no anticoag)--"scattered subacute punctate infarcts: 1 in R parietal lobe and 2 in occipital cortex" on MRI br.  CT angio head/neck: aortic arch athero.   . Thoracic compression fracture ([facility])  07/2016   T7 and T8-- T8 kyphoplasty during hosp admission 07/2016.  Neuro referred pt to pain mgmt for consideration of injection 09/2016.  I referred her to endo 08/2016 for consideration of calcitonin treatment.   PSH  Past Surgical History:  Procedure Laterality Date  . APPENDECTOMY  child  . CARDIOVASCULAR STRESS TEST  01/31/2016   Stress myoview: NORMAL/Low risk.  EF 56%.  . [place]  . COLONOSCOPY  2015   + hx of adenomatous polyps.  Need digest health spec in [facility] records to see when pt due for next colonoscopy  . IR GENERIC HISTORICAL  07/24/2016   IR KYPHO THORACIC WITH BONE BIOPSY 07/24/2016 Luanne Bras, MD [facility]  . KYPHOPLASTY  07/27/2016   T8   . PACEMAKER IMPLANT N/A 12/03/2016   Procedure: Pacemaker Implant;  Surgeon: Evans Lance, MD;  Location: [facility];  Service: Cardiovascular;  Laterality: N/A;  . Renal artery dopplers  02/26/2016   Her right renal dimension was 11 cm pole to pole with mild to moderate right renal artery stenosis. A right renal aortic ratio was 3.22 suggesting less than a 50% stenosis.  . TONSILLECTOMY    . TRANSTHORACIC ECHOCARDIOGRAM  01/29/2016   EF 55-60%, normal LV wall motion, grade I DD.  No cardiac source of emboli was seen.   FH  Family History  Problem Relation Age of Onset  . Cancer Mother   . Heart disease Father   . Early death Father   . Sudden Cardiac Death Neg Hx    SH  reports that she has never smoked. She has never used smokeless tobacco. She reports that she drinks about 0.6 oz of alcohol per week . She reports that she does not use drugs. Allergies  Allergies  Allergen Reactions  . Clonidine Derivatives Palpitations and Other (See Comments)    Very sedated  . Codeine Nausea Only  . Nickel Rash  . Sulfa Antibiotics Other (See Comments)    Reaction unknown   Home medications Prior to Admission medications   Medication Sig Start Date End Date Taking? Authorizing Provider  acetaminophen (TYLENOL) 500 MG tablet Take 500 mg by mouth every 6 (six) hours as needed for moderate pain.   Yes [provider]  amLODipine (NORVASC) 5 MG tablet Take 1 tablet (5 mg total) by mouth 2 (two) times daily with breakfast and lunch. 09/22/16  Yes McGowen, Adrian Blackwater, MD  apixaban (ELIQUIS) 2.5 MG TABS tablet Take 1 tablet (2.5 mg total) by mouth 2 (two) times daily. 08/25/16  Yes Lorretta Harp, MD  atenolol (TENORMIN) 100 MG tablet Take 1 tablet (100 mg total) by mouth daily. 09/28/16  Yes McGowen, Adrian Blackwater, MD  Calcium Carb-Cholecalciferol (CALCIUM/VITAMIN D PO) Take 1 tablet by mouth 2 (two) times daily.   Yes [provider]  fluticasone (CUTIVATE) 0.05 % cream Apply  topically 2 (two) times daily. 11/23/16  Yes McGowen, Adrian Blackwater, MD  furosemide (LASIX) 80 MG tablet Take 1 tablet (80 mg total) by mouth daily. Patient taking differently: Take 80 mg by mouth every other day.  09/08/16  Yes McGowen, Adrian Blackwater, MD  hydrALAZINE (APRESOLINE) 100 MG tablet Take 1 tablet (100 mg total) by mouth 3 (three) times daily. 08/12/16  Yes McGowen, Adrian Blackwater, MD  ibandronate (BONIVA) 150 MG tablet Take 1 tablet (150 mg total) by mouth every 30 (thirty) days. Take in the morning with a full glass of water, on an empty stomach,  and do not take anything else by mouth or lie down for the next 30 min. 08/12/16  Yes McGowen, Adrian Blackwater, MD  Multiple Vitamin (MULTIVITAMIN) tablet Take 1 tablet by mouth 3 (three) times a week.    Yes [provider]  ondansetron (ZOFRAN) 4 MG tablet Take 0.5-1 tablets (2-4 mg total) by mouth every 8 (eight) hours as needed for nausea or vomiting. 07/30/16  Yes McGowen, Adrian Blackwater, MD  oxyCODONE-acetaminophen (PERCOCET/ROXICET) 5-325 MG tablet Take 1-2 tablets by mouth every 6 (six) hours as needed for severe pain. 08/24/16  Yes McGowen, Adrian Blackwater, MD  pravastatin (PRAVACHOL) 40 MG tablet Take 40 mg by mouth every evening. 07/16/15  Yes [provider]  spironolactone (ALDACTONE) 25 MG tablet Take 1 tablet (25 mg total) by mouth daily. 1 tab po every other day Patient taking differently: Take 25 mg by mouth daily.  11/24/16  Yes McGowen, Adrian Blackwater, MD  vitamin C (ASCORBIC ACID) 500 MG tablet Take 500 mg by mouth daily.   Yes [provider]  Vitamin D, Ergocalciferol, (DRISDOL) 50000 units CAPS capsule 1 cap po q week x 12 weeks Patient taking differently: Take 50,000 Units by mouth every 7 (seven) days. For 12 weeks. (Thursdays) 11/09/16  Yes McGowen, Adrian Blackwater, MD    Current Medications Scheduled Meds: . amLODipine  5 mg Oral BID WC  . carvedilol  3.125 mg Oral BID WC  . feeding supplement (ENSURE ENLIVE)  237 mL Oral BID BM  . furosemide  80  mg Intravenous BID  . hydrALAZINE  100 mg Oral TID  . mouth rinse  15 mL Mouth Rinse BID  . potassium chloride  20 mEq Oral Daily  . pravastatin  40 mg Oral QPM  . vitamin C  500 mg Oral Daily  . Warfarin - Physician Dosing Inpatient   Does not apply q1800   Continuous Infusions: PRN Meds:.acetaminophen, morphine injection, ondansetron (ZOFRAN) IV, ondansetron, oxyCODONE-acetaminophen, phenol  CBC  Recent Labs Lab 12/01/16 0229 12/02/16 0305 12/03/16 0155  WBC 7.0 7.1 9.3  HGB 10.6* 10.7* 10.5*  HCT 32.3* 32.5* 33.4*  MCV 90.7 91.8 92.0  PLT 234 247 967   Basic Metabolic Panel  Recent Labs Lab 11/30/16 1331 12/01/16 0229 12/02/16 0305 12/03/16 0155 12/04/16 0325  NA 130* 132* 134* 133* 136  K 3.9 3.5 3.2* 4.5 4.8  CL 92* 95* 95* 96* 97*  CO2 24 25 27 24 27   GLUCOSE 123* 97 114* 102* 113*  BUN 93* 95* 91* 88* 89*  CREATININE 4.21* 3.95* 3.23* 3.17* 3.21*  CALCIUM 9.1 8.7* 8.3* 8.4* 8.5*    Physical Exam  Blood pressure (!) 160/72, pulse 65, temperature 97.9 F (36.6 C), temperature source Oral, resp. rate 18, height 4\' 11"  (1.499 m), weight 113 lb 14.4 oz (51.7 kg), SpO2 98 %. GEN: Thin, elderly appearing female in NAD CV: Tachycardic, irregular  PULM: Decreased breath sounds bilateral bases, otherwise clear  ABD: Soft, non tender, non distended, normal BS SKIN: No rash  EXT:2-3+ pitting edema to the knees bilaterally    Assessment  1. Acute on Chronic Renal Dysfunction: Secondary to hypervolemia and CHF exacerbation. Overall improved from admission. Baseline CKD IV w/ Scr ~2.5. Initially diuresed well with improvement in BUN/Cr, however no longer responding as well to IV lasix. UOP only 650 over the last 24 hours. Respiratory status stable on [place] but with persistently lower extremity edema.  2. Acute on Chronic CHF: Repeat ECHO now with new systolic dysfunction  EF 35-40% and ? RV strain concerning for PE, however already on anticoagulation. Not a candidate for  RHC due to CKD. Diuresing with IV lasix.  3. HTN: On amlodipine, hydralazine, and IV lasix. Atenolol DC'd and low dose coreg started today per cards.  4. PAF with Tachybrady syndrome: Holding Eliquis given renal dysfunction; on IV heparin, plan for warfarin on discharge. S/p pacemaker implantation yesterday.   Plan 1. Increase lasix to 120 mg TID, reassess tomorrow. If no improvement in edema or labs, may recommend lasix gtt.  2. Will discontinue amlodipine in the setting of persistent LE edema. Given new systolic dysfunction on Echo, this may allow for additional guideline directed HFrEF therapy.  3. Avoid NOACs given CKD. Warfarin on discharge.  4. Other issues per primary and cards.   Valerie Terrell, M.D. - PGY2 Pager: [phone] 12/04/2016, 3:28 PM

## 2016-12-04 NOTE — Progress Notes (Signed)
PT Cancellation Note  Patient Details Name: Valerie Terrell MRN: [id number] DOB: [birth year]   Cancelled Treatment:    Reason Eval/Treat Not Completed: Other (comment). Pt reports she has had a "very rough" morning and ask that I come back "much later". I will reattempt later today.   [place] 12/04/2016, 9:46 AM  [place]

## 2016-12-04 NOTE — Progress Notes (Signed)
Progress Note  Patient Name: Valerie Terrell Date of Encounter: 12/04/2016  Primary Cardiologist: Dr. Gwenlyn Found  Subjective   Breathing improved. No chest pain.   Inpatient Medications    Scheduled Meds: . amLODipine  5 mg Oral BID WC  . feeding supplement (ENSURE ENLIVE)  237 mL Oral BID BM  . furosemide  80 mg Intravenous BID  . hydrALAZINE  100 mg Oral TID  . mouth rinse  15 mL Mouth Rinse BID  . potassium chloride  40 mEq Oral Daily  . pravastatin  40 mg Oral QPM  . vitamin C  500 mg Oral Daily  . Warfarin - Physician Dosing Inpatient   Does not apply q1800   Continuous Infusions:  PRN Meds: acetaminophen, morphine injection, ondansetron (ZOFRAN) IV, ondansetron, oxyCODONE-acetaminophen, phenol   Vital Signs    Vitals:   12/03/16 1800 12/03/16 2047 12/04/16 0400 12/04/16 0741  BP: (!) 141/54 (!) 141/60 (!) 153/68 (!) 160/72  Pulse: 70 65 65 65  Resp: 18 16 18 18   Temp: 98 F (36.7 C) 98.4 F (36.9 C) 97.7 F (36.5 C) 97.9 F (36.6 C)  TempSrc: Oral Oral Oral Oral  SpO2: 97% 99% 98% 98%  Weight:   113 lb 14.4 oz (51.7 kg)   Height:        Intake/Output Summary (Last 24 hours) at 12/04/16 2035 Last data filed at 12/04/16 0600  Gross per 24 hour  Intake              290 ml  Output              650 ml  Net             -360 ml   Filed Weights   12/02/16 0500 12/03/16 0400 12/04/16 0400  Weight: 112 lb 6.4 oz (51 kg) 113 lb 14.4 oz (51.7 kg) 113 lb 14.4 oz (51.7 kg)    Telemetry    SR with intermittent pacing - Personally Reviewed  ECG    None today   Physical Exam   GEN: thin frail older female in no acute distress.   Neck: No JVD Cardiac: RRR, no murmurs, rubs, or gallops.  Respiratory: diminished breath sound throughout  GI: Soft, nontender, non-distended  MS: No edema; No deformity. Neuro:  Nonfocal  Psych: Normal affect  Extremity:  1-2+ BL LE. Distal pedal pulse 2+ Bilaterally.   Labs    Chemistry Recent Labs Lab 12/02/16 0305  12/03/16 0155 12/04/16 0325  NA 134* 133* 136  K 3.2* 4.5 4.8  CL 95* 96* 97*  CO2 27 24 27   GLUCOSE 114* 102* 113*  BUN 91* 88* 89*  CREATININE 3.23* 3.17* 3.21*  CALCIUM 8.3* 8.4* 8.5*  GFRNONAA 12* 13* 13*  GFRAA 14* 15* 15*  ANIONGAP 12 13 12      Hematology Recent Labs Lab 12/01/16 0229 12/02/16 0305 12/03/16 0155  WBC 7.0 7.1 9.3  RBC 3.56* 3.54* 3.63*  HGB 10.6* 10.7* 10.5*  HCT 32.3* 32.5* 33.4*  MCV 90.7 91.8 92.0  MCH 29.8 30.2 28.9  MCHC 32.8 32.9 31.4  RDW 13.5 13.8 13.6  PLT 234 247 285    Cardiac Enzymes Recent Labs Lab 11/30/16 1331 11/30/16 2033 12/01/16 0229  TROPONINI 0.11* 0.09* 0.09*   No results for input(s): TROPIPOC in the last 168 hours.   BNP Recent Labs Lab 11/30/16 1331  BNP >4,500.0*     DDimer No results for input(s): DDIMER in the last 168 hours.  Radiology    Dg Chest 2 View  Result Date: 12/04/2016 CLINICAL DATA:  Pacemaker placement EXAM: CHEST  2 VIEW COMPARISON:  11/30/2016 FINDINGS: The heart is mildly enlarged. Double lead left subclavian pacemaker device has been placed. Tips of the leads are in the right atrium and right ventricle. There is no ensuing pneumothorax. Patchy right lower lung zone opacities are stable. Left pleural effusion is small and stable. Linear opacities at the left base are stable. Osteopenia. Mid-level thoracic vertebroplasty changes. There are compression fractures in both vertebral bodies about the augmented mid-level thoracic vertebral body. These bony findings are stable. IMPRESSION: Left subclavian pacemaker device placement without complication or pneumothorax. Stable right basilar pulmonary opacities Stable left pleural effusion and basilar atelectasis for scarring. Electronically Signed   By: Marybelle Killings M.D.   On: 12/04/2016 07:50    Cardiac Studies   Echo 12/02/16 Study Conclusions  - Left ventricle: The cavity size was normal. There was moderate   concentric hypertrophy. Systolic  function was normal. Wall motion   was normal; there were no regional wall motion abnormalities. The   study was not technically sufficient to allow evaluation of LV   diastolic dysfunction due to atrial fibrillation. - Aortic valve: Valve mobility was restricted. Transvalvular   velocity was within the normal range. There was no stenosis.   There was no regurgitation. - Mitral valve: There was mild regurgitation. - Left atrium: The atrium was mildly dilated. - Right ventricle: The cavity size was moderately dilated. Wall   thickness was normal. Systolic function was moderately reduced. - Tricuspid valve: There was mild regurgitation. - Pulmonary arteries: Systolic pressure was moderately increased.   PA peak pressure: 47 mm Hg (S). - Inferior vena cava: The vessel was normal in size. - Pericardium, extracardiac: A trivial pericardial effusion was   identified posterior to the heart. Features were not consistent   with tamponade physiology.  Impressions:  - When compared to the prior study from 07/14/2016 LVEF has   decreased from 60-65% to 35-40%. RV is moderately dilated with   moderately decreased RVEF. There is RV strain. Consider workup   for pulmonary embolism.   The study acquired while the patient in atrial fibrillation with   RVR.  Patient Profile     83 y.o. female with PMH of HTN, CKD III, HL, PAF, Chronic diastolic HF and atrophy of the left kidney who presented with worsening dyspnea, nausea and palpitations.   Assessment & Plan    1. Acute on Chronic diastolic HF:  - BNP >3646 this admission. CXR with edema. On lasix 80mg  BID however net I & O only 1L. Weight down 1lb.  - Scr improving with diuresis to 3.21 from 4.21 on admission (7/16). It was 2.4 on 11/23/16.  - Echo this admission showed ? LV function (normal LV function at one place while other place indicated  decreased from 60-65% to 35-40% when compared to 07/14/16). No WM abnormality. RV is moderately dilated  with moderately decreased RVEF. There is RV strain. Consider workup for pulmonary embolism. PA pressure of 47 mm Hg. The study acquired while the patient in atrial fibrillation with RVR. Will review echo with MD.  - Continue IV diuresis.   2. Elevated Troponin:  - Mild flat Trop trend, suspect 2/2 demand ischemia in the setting of HF and CKD.   3. PAF with tachybrady syndrome - noted 3.5 sec pause. Seen by Dr. Lovena Le S/p PPM this admission. CAH2DS2Vasc is 6. Eliquis held on  admission--> treated with heparin gtt --> now started on Coumadin due to worsen kidney function.  - Sinus rhythm currently  4. Acute on CKD III:  - AS above. Consider renal consult for diuretics management given only 1L output.  5. HTN:  - Remains hypertensive. Continue hydralazine 100mg  TID and amlodipine 5mg  BID. Consider adding BB as she has PPM.   6. RV strain  - Echo showed RV is moderately dilated with moderately decreased RVEF. There is RV strain. Likely due to PA pressure of 80mm Hg. Her vitals does not suggest that she might has PE also she was chronically anticoagulated.  - Not a candidate to [place] due to CKD.   Signed, Leanor Kail, PA  12/04/2016, 9:07 AM    Agree with note by Robbie Lis PA-C  Postop day 1. Transvenous pacemaker insertion by Dr. Lovena Le for PAF with long pauses. She currently is in sinus rhythm and feels clinically improved. Her I/os are only -1 L. Her creatinine has stabilized in the 3.2 range. She is on high-dose IV diuretics. I reviewed her 2-D echocardiogram and to my eye and Dr. Lysbeth Penner as well her EF is more in the 50-55% range. We have asked renal to get involved to help guide her diuretic dosing. She may be a candidate for amiodarone/antiarrhythmic therapy which can be guided by her post hospital EP follow-up and pacemaker interrogation at that time.  Lorretta Harp, M.D., [place], [facility], [place] [address] [place],  [place]  [id number]  [phone] 12/04/2016 10:41 AM

## 2016-12-04 NOTE — Progress Notes (Signed)
PROGRESS NOTE  Valerie Terrell  QQI:[id number] DOB: January 02, 1933 DOA: 11/30/2016 PCP: Tammi Sou, MD   Brief Narrative: Valerie Terrell is a 83 y.o. female with medical history significant of left kidney atrophy, retinal artery occlusion in the left eye, stage III chronic renal insufficiency, history of subarachnoid hemorrhage, hyperlipidemia, hypertension, lumbar spondylosis, paroxysmal atrial fibrillation on apixaban who presented to the ED 7/16 with progressively worse dyspnea, lower extremity edema, orthopnea and weakness for the past 2 weeks, but particularly worse in over the weekend. She has also had mild nausea, occasional palpitations and dizziness. Initial vital signs in the emergency department temperature 97.30F, pulse 63, blood pressure 155/67 mmHg, respirations 22 and O2 sat 93% on room air. Her WBC is 7.0, hemoglobin 11.3 g/dL and platelets 246. Her troponin level was 0.11, BNP over 4500, sodium 130, potassium 3.9, chloride 92, bicarbonate 24 mmol/L. Her BUN was 93, creatinine 4.21 up from 2.4 a week ago. Her chest radiograph shows interstitial and mild alveolar edema with left pleural effusion.  IV lasix was administered and the patient was admitted for ongoing diuresis.   Assessment & Plan: Principal Problem:   Pulmonary edema Active Problems:   PAF (paroxysmal atrial fibrillation) (HCC)   Essential hypertension   Acute renal failure superimposed on chronic kidney disease (HCC)   Hyperlipidemia   Elevated troponin level   Anemia   Acute respiratory failure with hypoxia (HCC)  Acute hypoxic respiratory failure due to pulmonary edema: Due to volume overload from acute renal failure. BNP >4500.  - Continue supplemental oxygen prn - Repeat echo EF 50-55% per Dr. Gwenlyn Found (see his note).  - Pt is not diuresing well and cardiology asked for renal assistance with fluid balance management, I was able to speak with Dr. Joelyn Oms and he will be consulting on patient today.   Tachy-Brady  Syndrome - Pt had a pacemaker placed today 12/03/16.  She is now recovering from procedure, post op mgmt per Psychologist, sport and exercise. She says she is feeling better.    Acute renal failure superimposed on stage III CKD: Follows with Dr. Florene Glen as outpatient. SCr 2.51 at recent discharge for similar presentation, up to 4.21 on admission. Uncertain cause, though she may not be taking diuretics as directed.  - Lasix 80mg  IV BID, holding spironolactone for now. - Monitor BMP and UOP. Creatinine improved from admission.    Elevated troponin: Flat trend, likely demand ischemia with decreased clearance, though pt is having chest pain. ECG appears nonischemic.  - cardiology following.   Essential hypertension - Continue amlodipine (5mg  BID), hydralazine 100mg  TID, atenolol 100mg . Chlorthalidone stopped at last admission in March 2018. - Above goal, titrate as indicated with diuresis.   PAF: with documented pauses  - CHA2DS2-VASc Scoreat least 5.  - Continue atenolol, currently NSR - warfarin for anticoagulation   Hyperlipidemia: Chronic, stable.  - Continue statin  Anemia of chronic disease:  - monitor CBC   DVT prophylaxis: Heparin IV Code Status: Full Family Communication: Daughter at bedside  Disposition Plan: TBD  PT/OT eval pending.   Consultants:   Cardiology  Procedures:   None  Antimicrobials:  None   Subjective: Pt says she feels a little better today.      Objective: Vitals:   12/03/16 2047 12/04/16 0400 12/04/16 0741 12/04/16 1153  BP: (!) 141/60 (!) 153/68 (!) 160/72 (!) 148/108  Pulse: 65 65 65 69  Resp: 16 18 18 20   Temp: 98.4 F (36.9 C) 97.7 F (36.5 C) 97.9 F (36.6 C) 97.8 F (  36.6 C)  TempSrc: Oral Oral Oral Oral  SpO2: 99% 98% 98% 97%  Weight:  51.7 kg (113 lb 14.4 oz)    Height:        Intake/Output Summary (Last 24 hours) at 12/04/16 1154 Last data filed at 12/04/16 1010  Gross per 24 hour  Intake              530 ml  Output              850 ml  Net              -320 ml   Filed Weights   12/02/16 0500 12/03/16 0400 12/04/16 0400  Weight: 51 kg (112 lb 6.4 oz) 51.7 kg (113 lb 14.4 oz) 51.7 kg (113 lb 14.4 oz)   General exam: 83 y.o. female sitting up in bed, mild discomfort Respiratory system:  3L by [place] without accessory muscle use. Bibasilar crackles.  Cardiovascular system: normal s1, s2 sounds. No murmur, rub, or gallop. No JVD, and 2+ pitting LE edema. Gastrointestinal system: Abdomen soft, non-tender, non-distended, with normoactive bowel sounds. No organomegaly or masses felt. Central nervous system: Alert and oriented. No focal neurological deficits. Extremities: 2+ edema bilateral LEs.  Warm, no deformities.  Skin: No rashes, lesions no ulcers.   Psychiatry: Judgement and insight appear normal. Mood & affect appropriate.   Data Reviewed: I have personally reviewed following labs and imaging studies  CBC:   Recent Labs Lab 11/30/16 1331 12/01/16 0229 12/02/16 0305 12/03/16 0155  WBC 7.0 7.0 7.1 9.3  HGB 11.3* 10.6* 10.7* 10.5*  HCT 34.5* 32.3* 32.5* 33.4*  MCV 90.6 90.7 91.8 92.0  PLT 246 234 247 740   Basic Metabolic Panel:  Recent Labs Lab 11/30/16 1331 12/01/16 0229 12/02/16 0305 12/03/16 0155 12/04/16 0325  NA 130* 132* 134* 133* 136  K 3.9 3.5 3.2* 4.5 4.8  CL 92* 95* 95* 96* 97*  CO2 24 25 27 24 27   GLUCOSE 123* 97 114* 102* 113*  BUN 93* 95* 91* 88* 89*  CREATININE 4.21* 3.95* 3.23* 3.17* 3.21*  CALCIUM 9.1 8.7* 8.3* 8.4* 8.5*   GFR: Estimated Creatinine Clearance: 9.4 mL/min (A) (by C-G formula based on SCr of 3.21 mg/dL (H)).  Cardiac Enzymes:  Recent Labs Lab 11/30/16 1331 11/30/16 2033 12/01/16 0229  TROPONINI 0.11* 0.09* 0.09*   CBG:  Recent Labs Lab 11/30/16 1617 12/03/16 0646  GLUCAP 125* 104*   Urine analysis:    Component Value Date/Time   COLORURINE YELLOW 11/30/2016 1610   APPEARANCEUR CLEAR 11/30/2016 1610   LABSPEC 1.013 11/30/2016 1610   PHURINE 5.0 11/30/2016  1610   GLUCOSEU NEGATIVE 11/30/2016 1610   HGBUR NEGATIVE 11/30/2016 1610   BILIRUBINUR NEGATIVE 11/30/2016 1610   BILIRUBINUR negative 09/24/2016 [place] 11/30/2016 1610   PROTEINUR 30 (A) 11/30/2016 1610   UROBILINOGEN 0.2 09/24/2016 1453   NITRITE NEGATIVE 11/30/2016 1610   LEUKOCYTESUR SMALL (A) 11/30/2016 1610   Radiology Studies: Dg Chest 2 View  Result Date: 12/04/2016 CLINICAL DATA:  Pacemaker placement EXAM: CHEST  2 VIEW COMPARISON:  11/30/2016 FINDINGS: The heart is mildly enlarged. Double lead left subclavian pacemaker device has been placed. Tips of the leads are in the right atrium and right ventricle. There is no ensuing pneumothorax. Patchy right lower lung zone opacities are stable. Left pleural effusion is small and stable. Linear opacities at the left base are stable. Osteopenia. Mid-level thoracic vertebroplasty changes. There are compression fractures in both  vertebral bodies about the augmented mid-level thoracic vertebral body. These bony findings are stable. IMPRESSION: Left subclavian pacemaker device placement without complication or pneumothorax. Stable right basilar pulmonary opacities Stable left pleural effusion and basilar atelectasis for scarring. Electronically Signed   By: Marybelle Killings M.D.   On: 12/04/2016 07:50    LOS: 4 days    Time spent: 23 minutes  Irwin Brakeman, MD [facility] Pager [phone]  If 7PM-7AM, please contact night-coverage [email or web address] Password [email or web address] 12/04/2016, 11:54 AM

## 2016-12-04 NOTE — Progress Notes (Signed)
ANTICOAGULATION CONSULT NOTE - Initial Consult  Pharmacy Consult for warfarin Indication: atrial fibrillation  Allergies  Allergen Reactions  . Clonidine Derivatives Palpitations and Other (See Comments)    Very sedated  . Codeine Nausea Only  . Nickel Rash  . Sulfa Antibiotics Other (See Comments)    Reaction unknown    Patient Measurements: Height: 4\' 11"  (149.9 cm) Weight: 113 lb 14.4 oz (51.7 kg) IBW/kg (Calculated) : 43.2  Vital Signs: Temp: 97.8 F (36.6 C) (07/20 1153) Temp Source: Oral (07/20 1153) BP: 148/108 (07/20 1153) Pulse Rate: 69 (07/20 1153)  Labs:  Recent Labs  12/01/16 1921 12/02/16 0305 12/02/16 1937 12/03/16 0155 12/04/16 0325  HGB  --  10.7*  --  10.5*  --   HCT  --  32.5*  --  33.4*  --   PLT  --  247  --  285  --   APTT 49*  --  36  --   --   LABPROT  --   --   --   --  14.6  INR  --   --   --   --  1.14  HEPARINUNFRC >2.20*  --  1.78*  --   --   CREATININE  --  3.23*  --  3.17* 3.21*    Estimated Creatinine Clearance: 9.4 mL/min (A) (by C-G formula based on SCr of 3.21 mg/dL (H)).   Medical History: Past Medical History:  Diagnosis Date  . Atrophy of left kidney    with absent blood flow by renal artery dopplers (Dr. Gwenlyn Found)  . 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 Chronic renal insufficiency, stage 4 (severe) (HCC)    Baseline GFR 25 ml/min-- renal u/s showed atrophic/hypoplastic left kidney.  No hydronephrosis.  Small right renal cyst.  . History of adenomatous polyp of colon   . History of subarachnoid hemorrhage 10/2014   after syncope and while on xarelto  . Hyperlipidemia   . Hypertension    Difficult to control, in the setting of one functioning kidney: pt was referred to nephrology by Dr. Gwenlyn Found 06/2015.  . Lumbar radiculopathy 2012  . Lumbar spondylosis    MR 07/2016---no sign of spinal nerve compression or cord compression.  Pt set up with outpt ortho while admitted to hosp 07/2016.  . Metatarsal fracture  06/10/2016   Nondisplaced, left 5th metatarsal--pt was referred to ortho  . Osteopenia 2014   T-score -2.1  . PAF (paroxysmal atrial fibrillation) (HCC)    Eliquis started after BRAO and CVA  . Pulmonary edema 11/2016  . Stroke [facility])    cardioembolic (had CVA while on no anticoag)--"scattered subacute punctate infarcts: 1 in R parietal lobe and 2 in occipital cortex" on MRI br.  CT angio head/neck: aortic arch athero.   . Thoracic compression fracture ([place]) 07/2016   T7 and T8-- T8 kyphoplasty during hosp admission 07/2016.  Neuro referred pt to pain mgmt for consideration of injection 09/2016.  I referred her to endo 08/2016 for consideration of calcitonin treatment.   Assessment: 83 y.o.femalewith medical history significant of left kidney atrophy, retinal artery occlusion in the left eye, stage III chronic renal insufficiency, history of subarachnoid hemorrhage, hyperlipidemia, hypertension, lumbar spondylosis, paroxysmal atrial fibrillation on apixabanwho presented to the ED 7/16 with progressively worse dyspnea, lower extremity edema, orthopnea and weakness for the past 2 weeks, but particularly worse in over the weekend.  Patient was on apixaban prior to admit but with worsening renal function she is  being transitioned to warfarin.  INR 1.14, 2.5mg  given last night will repeat tonight. No issues noted.   Goal of Therapy:  INR 2-3 Monitor platelets by anticoagulation protocol: Yes   Plan:  Warfarin 2.5mg  tonight Daily INR  Erin Hearing PharmD., BCPS Clinical Pharmacist Pager [phone] 12/04/2016 2:24 PM

## 2016-12-05 LAB — BASIC METABOLIC PANEL
ANION GAP: 11 (ref 5–15)
BUN: 90 mg/dL — ABNORMAL HIGH (ref 6–20)
CALCIUM: 8.6 mg/dL — AB (ref 8.9–10.3)
CO2: 26 mmol/L (ref 22–32)
Chloride: 97 mmol/L — ABNORMAL LOW (ref 101–111)
Creatinine, Ser: 3.09 mg/dL — ABNORMAL HIGH (ref 0.44–1.00)
GFR, EST AFRICAN AMERICAN: 15 mL/min — AB (ref >60)
GFR, EST NON AFRICAN AMERICAN: 13 mL/min — AB (ref >60)
Glucose, Bld: 122 mg/dL — ABNORMAL HIGH (ref 65–99)
Potassium: 4.9 mmol/L (ref 3.5–5.1)
Sodium: 134 mmol/L — ABNORMAL LOW (ref 135–145)

## 2016-12-05 LAB — PROTIME-INR
INR: 1.13
Prothrombin Time: 14.5 seconds (ref 11.4–15.2)

## 2016-12-05 MED ORDER — AMIODARONE HCL 200 MG PO TABS
200.0000 mg | ORAL_TABLET | Freq: Two times a day (BID) | ORAL | Status: DC
  Administered 2016-12-05 – 2016-12-07 (×5): 200 mg via ORAL
  Filled 2016-12-05 (×5): qty 1

## 2016-12-05 MED ORDER — WARFARIN SODIUM 5 MG PO TABS
5.0000 mg | ORAL_TABLET | Freq: Once | ORAL | Status: AC
  Administered 2016-12-05: 5 mg via ORAL
  Filled 2016-12-05: qty 1

## 2016-12-05 NOTE — Progress Notes (Signed)
Subjective:  Doesn't feel well today and complains of malaise weakness.  No chest pain.  Breathing has improved somewhat.  Renal function continues to improve but blood pressure still elevated.  Objective:  Vital Signs in the last 24 hours: BP (!) 161/60 (BP Location: Left Arm)   Pulse 60   Temp 98.7 F (37.1 C) (Oral)   Resp 18   Ht 4\' 11"  (1.499 m)   Wt 50.9 kg (112 lb 3.2 oz)   SpO2 100%   BMI 22.66 kg/m   Physical Exam: Thin elderly female in no acute distress Lungs:  Clear  Cardiac:  Regular rhythm, normal S1 and S2, no S3 Extremities:  Changes of chronic venous insufficiency noted, 2+ edema.  Intake/Output from previous day: 07/20 0701 - 07/21 0700 In: 604 [P.O.:480; IV Piggyback:124] Out: 1175 [Urine:1175] Weight Filed Weights   12/03/16 0400 12/04/16 0400 12/05/16 0606  Weight: 51.7 kg (113 lb 14.4 oz) 51.7 kg (113 lb 14.4 oz) 50.9 kg (112 lb 3.2 oz)    Lab Results: Basic Metabolic Panel:  Recent Labs  12/04/16 0325 12/05/16 0227  NA 136 134*  K 4.8 4.9  CL 97* 97*  CO2 27 26  GLUCOSE 113* 122*  BUN 89* 90*  CREATININE 3.21* 3.09*    CBC:  Recent Labs  12/03/16 0155  WBC 9.3  HGB 10.5*  HCT 33.4*  MCV 92.0  PLT 285    BNP    Component Value Date/Time   BNP >4,500.0 (H) 11/30/2016 1331   PROTIME: Lab Results  Component Value Date   INR 1.13 12/05/2016   INR 1.14 12/04/2016   INR 0.87 07/23/2016    Telemetry: Sinus rhythm with occasional paced beats.  Episodes of paroxysmal atrial fibrillation with rapid ventricular response noted last evening.  Personally reviewed.  Assessment/Plan:  1.  Recurrent paroxysmal atrial fibrillation 2.  Acute on chronic diastolic heart failure 3.  Pulmonary hypertension 4.  Tachycardia-bradycardia syndrome with recent pacemaker insertion 5.  Acute on chronic renal failure clinically improved  Recommendations:  She has paroxysmal atrial fibrillation that may be exacerbating her condition.  We will  go ahead and initiate amiodarone at 200 mg twice daily.  Continue observation.  Bradycardia should not be much of an issue now that she has a pacemaker.  Diuretic management per nephrology.       Kerry Hough  MD [facility]  12/05/2016, 10:12 AM

## 2016-12-05 NOTE — Progress Notes (Signed)
ANTICOAGULATION CONSULT NOTE - Follow Up Consult  Pharmacy Consult for Warfarin Indication: atrial fibrillation  Allergies  Allergen Reactions  . Clonidine Derivatives Palpitations and Other (See Comments)    Very sedated  . Codeine Nausea Only  . Nickel Rash  . Sulfa Antibiotics Other (See Comments)    Reaction unknown    Patient Measurements: Height: 4\' 11"  (149.9 cm) Weight: 112 lb 3.2 oz (50.9 kg) IBW/kg (Calculated) : 43.2  Vital Signs: Temp: 98 F (36.7 C) (07/21 1117) Temp Source: Oral (07/21 1117) BP: 180/65 (07/21 1117) Pulse Rate: 68 (07/21 1117)  Labs:  Recent Labs  12/02/16 1937 12/03/16 0155 12/04/16 0325 12/05/16 0227  HGB  --  10.5*  --   --   HCT  --  33.4*  --   --   PLT  --  285  --   --   APTT 36  --   --   --   LABPROT  --   --  14.6 14.5  INR  --   --  1.14 1.13  HEPARINUNFRC 1.78*  --   --   --   CREATININE  --  3.17* 3.21* 3.09*    Estimated Creatinine Clearance: 9.7 mL/min (A) (by C-G formula based on SCr of 3.09 mg/dL (H)).  Assessment: 3 yoF with left kidney atrophy, retinal artery occlusion in left eye, stage 3 chronic renal insufficiency, history of subarachnoid hemorrhage, paroxysmal atrial fibrillation on apixaban. She presented to the ED 7/15 with dyspnea, and lower extremity edema.  Patient was on apixaban prior to admit but with worsening renal function she as transitioned to warfarin 7/19.   INR subtherapeutic at 1.13. No bleeding noted. CBC stable.   Goal of Therapy:  INR 2-3 Monitor platelets by anticoagulation protocol: Yes   Plan:  Warfarin 5 mg PO x1 tonight Daily INR Monitor CBC  Bridgett Larsson, PharmD, [place] PGY1 Pharmacy Resident 12/05/2016,3:13 PM

## 2016-12-05 NOTE — Progress Notes (Signed)
PROGRESS NOTE  Valerie Terrell  TMA:[id number] DOB: 1933-05-06 DOA: 11/30/2016 PCP: Tammi Sou, MD   Brief Narrative: Valerie Terrell is a 83 y.o. female with medical history significant of left kidney atrophy, retinal artery occlusion in the left eye, stage III chronic renal insufficiency, history of subarachnoid hemorrhage, hyperlipidemia, hypertension, lumbar spondylosis, paroxysmal atrial fibrillation on apixaban who presented to the ED 7/16 with progressively worse dyspnea, lower extremity edema, orthopnea and weakness for the past 2 weeks, but particularly worse in over the weekend. She has also had mild nausea, occasional palpitations and dizziness. Initial vital signs in the emergency department temperature 97.52F, pulse 63, blood pressure 155/67 mmHg, respirations 22 and O2 sat 93% on room air. Her WBC is 7.0, hemoglobin 11.3 g/dL and platelets 246. Her troponin level was 0.11, BNP over 4500, sodium 130, potassium 3.9, chloride 92, bicarbonate 24 mmol/L. Her BUN was 93, creatinine 4.21 up from 2.4 a week ago. Her chest radiograph shows interstitial and mild alveolar edema with left pleural effusion.  IV lasix was administered and the patient was admitted for ongoing diuresis.   Assessment & Plan: Principal Problem:   Pulmonary edema Active Problems:   PAF (paroxysmal atrial fibrillation) (HCC)   Essential hypertension   Acute renal failure superimposed on chronic kidney disease (HCC)   Hyperlipidemia   Elevated troponin level   Anemia   Acute respiratory failure with hypoxia (HCC)   Pacemaker  Acute hypoxic respiratory failure due to pulmonary edema: Due to volume overload from acute renal failure. BNP >4500.  - Continue supplemental oxygen prn - Repeat echo EF 50-55% per Dr. Gwenlyn Found (see his note).  - Pt is diuresing better and feeling some better today.   Tachy-Brady Syndrome - Pt had a pacemaker placed today 12/03/16.  She is now recovering from procedure, post op mgmt per  Psychologist, sport and exercise. She says she is feeling better.    Acute renal failure superimposed on stage III CKD: Follows with Dr. Florene Glen as outpatient. SCr 2.51 at recent discharge for similar presentation, up to 4.21 on admission. Uncertain cause, though she may not be taking diuretics as directed.  - Lasix dose was adjusted by nephrology team with good results and now diuresing better.  Creatinine stabilized.   - Monitor BMP and UOP. Creatinine improved from admission.    Elevated troponin: Flat trend, likely demand ischemia with decreased clearance, though pt is having chest pain. ECG appears nonischemic.  - cardiology following.   Essential hypertension - Continue amlodipine (5mg  BID), hydralazine 100mg  TID, atenolol 100mg . Chlorthalidone stopped at last admission in March 2018. - Above goal, titrate as indicated with diuresis.   PAF: with documented pauses  - CHA2DS2-VASc Scoreat least 5.  - Continue atenolol, currently NSR - warfarin for anticoagulation   Hyperlipidemia: Chronic, stable.  - Continue statin  Anemia of chronic disease:  - monitor CBC   DVT prophylaxis: Heparin IV Code Status: Full Family Communication: Daughter at bedside  Disposition Plan:  PT/OT recommending HHPT   Consultants:   Cardiology  Procedures:   None  Antimicrobials:  None   Subjective: Pt says she feels better today.      Objective: Vitals:   12/04/16 1153 12/04/16 1607 12/04/16 2101 12/05/16 0606  BP: (!) 148/108 (!) 151/61 (!) 153/55 (!) 161/60  Pulse: 69 66 63 60  Resp: 20 18 20 18   Temp: 97.8 F (36.6 C) 97.7 F (36.5 C) 97.6 F (36.4 C) 98.7 F (37.1 C)  TempSrc: Oral Axillary Oral Oral  SpO2: 97%  97% 97% 100%  Weight:    50.9 kg (112 lb 3.2 oz)  Height:        Intake/Output Summary (Last 24 hours) at 12/05/16 1032 Last data filed at 12/05/16 0944  Gross per 24 hour  Intake              604 ml  Output              975 ml  Net             -371 ml   Filed Weights   12/03/16  0400 12/04/16 0400 12/05/16 0606  Weight: 51.7 kg (113 lb 14.4 oz) 51.7 kg (113 lb 14.4 oz) 50.9 kg (112 lb 3.2 oz)   General exam: 83 y.o. female sitting up in bed, mild discomfort Respiratory system:  3L by [place] without accessory muscle use. Bibasilar crackles.  Cardiovascular system: normal s1, s2 sounds. No murmur, rub, or gallop. No JVD, and 2+ pitting LE edema. Gastrointestinal system: Abdomen soft, non-tender, non-distended, with normoactive bowel sounds. No organomegaly or masses felt. Central nervous system: Alert and oriented. No focal neurological deficits. Extremities: 1+ edema bilateral LEs.  Warm, no deformities.  Skin: No rashes, lesions no ulcers.   Psychiatry: Judgement and insight appear normal. Mood & affect appropriate.   Data Reviewed: I have personally reviewed following labs and imaging studies  CBC:   Recent Labs Lab 11/30/16 1331 12/01/16 0229 12/02/16 0305 12/03/16 0155  WBC 7.0 7.0 7.1 9.3  HGB 11.3* 10.6* 10.7* 10.5*  HCT 34.5* 32.3* 32.5* 33.4*  MCV 90.6 90.7 91.8 92.0  PLT 246 234 247 076   Basic Metabolic Panel:  Recent Labs Lab 12/01/16 0229 12/02/16 0305 12/03/16 0155 12/04/16 0325 12/05/16 0227  NA 132* 134* 133* 136 134*  K 3.5 3.2* 4.5 4.8 4.9  CL 95* 95* 96* 97* 97*  CO2 25 27 24 27 26   GLUCOSE 97 114* 102* 113* 122*  BUN 95* 91* 88* 89* 90*  CREATININE 3.95* 3.23* 3.17* 3.21* 3.09*  CALCIUM 8.7* 8.3* 8.4* 8.5* 8.6*   GFR: Estimated Creatinine Clearance: 9.7 mL/min (A) (by C-G formula based on SCr of 3.09 mg/dL (H)).  Cardiac Enzymes:  Recent Labs Lab 11/30/16 1331 11/30/16 2033 12/01/16 0229  TROPONINI 0.11* 0.09* 0.09*   CBG:  Recent Labs Lab 11/30/16 1617 12/03/16 0646  GLUCAP 125* 104*   Urine analysis:    Component Value Date/Time   COLORURINE YELLOW 11/30/2016 1610   APPEARANCEUR CLEAR 11/30/2016 1610   LABSPEC 1.013 11/30/2016 1610   PHURINE 5.0 11/30/2016 1610   GLUCOSEU NEGATIVE 11/30/2016 1610    HGBUR NEGATIVE 11/30/2016 1610   BILIRUBINUR NEGATIVE 11/30/2016 1610   BILIRUBINUR negative 09/24/2016 [place] 11/30/2016 1610   PROTEINUR 30 (A) 11/30/2016 1610   UROBILINOGEN 0.2 09/24/2016 1453   NITRITE NEGATIVE 11/30/2016 1610   LEUKOCYTESUR SMALL (A) 11/30/2016 1610   Radiology Studies: Dg Chest 2 View  Result Date: 12/04/2016 CLINICAL DATA:  Pacemaker placement EXAM: CHEST  2 VIEW COMPARISON:  11/30/2016 FINDINGS: The heart is mildly enlarged. Double lead left subclavian pacemaker device has been placed. Tips of the leads are in the right atrium and right ventricle. There is no ensuing pneumothorax. Patchy right lower lung zone opacities are stable. Left pleural effusion is small and stable. Linear opacities at the left base are stable. Osteopenia. Mid-level thoracic vertebroplasty changes. There are compression fractures in both vertebral bodies about the augmented mid-level thoracic vertebral body. These bony  findings are stable. IMPRESSION: Left subclavian pacemaker device placement without complication or pneumothorax. Stable right basilar pulmonary opacities Stable left pleural effusion and basilar atelectasis for scarring. Electronically Signed   By: Marybelle Killings M.D.   On: 12/04/2016 07:50    LOS: 5 days    Time spent: 22 minutes  Irwin Brakeman, MD [facility] Pager [phone]  If 7PM-7AM, please contact night-coverage [email or web address] Password [facility] 12/05/2016, 10:32 AM

## 2016-12-05 NOTE — Progress Notes (Signed)
Admit: 11/30/2016 LOS: 5  35F CKD4 with hypervolemia, acute CHF exacerbation  Subjective:  LEE much improved this AM More UOP on increase dose/freq of diuretics She feels 'better'   07/20 0701 - 07/21 0700 In: 604 [P.O.:480; IV Piggyback:124] Out: 1175 [Urine:1175]  Filed Weights   12/03/16 0400 12/04/16 0400 12/05/16 0606  Weight: 51.7 kg (113 lb 14.4 oz) 51.7 kg (113 lb 14.4 oz) 50.9 kg (112 lb 3.2 oz)    Scheduled Meds: . amiodarone  200 mg Oral BID  . carvedilol  3.125 mg Oral BID WC  . feeding supplement (ENSURE ENLIVE)  237 mL Oral BID BM  . hydrALAZINE  100 mg Oral TID  . mouth rinse  15 mL Mouth Rinse BID  . patient's guide to using coumadin book   Does not apply Once  . potassium chloride  20 mEq Oral Daily  . pravastatin  40 mg Oral QPM  . vitamin C  500 mg Oral Daily  . Warfarin - Pharmacist Dosing Inpatient   Does not apply q1800   Continuous Infusions: . furosemide 120 mg (12/05/16 0903)   PRN Meds:.acetaminophen, morphine injection, ondansetron (ZOFRAN) IV, ondansetron, oxyCODONE-acetaminophen, phenol, senna  Current Labs: reviewed    Physical Exam:  Blood pressure (!) 161/60, pulse 60, temperature 98.7 F (37.1 C), temperature source Oral, resp. rate 18, height 4\' 11"  (1.499 m), weight 50.9 kg (112 lb 3.2 oz), SpO2 100 %. NAD RRR CTAB 1+ edema, less pitting, less tight S/NT/ND  A 1. CKD4, stable GFR, lytes ok 2. Hypervolemia, acute CHF exacerbaiton; volume status improved today 3. AFib and bradycardia s/p PPM  4. HTN, amlodipine stopped  P 1. Cont TID lasix for another 24h 2. Likely transition to PO diuretics tomorrow 3. Daily weights, Daily Renal Panel, Strict I/Os, Avoid nephrotoxins (NSAIDs, judicious IV Contrast)    Pearson Grippe MD 12/05/2016, 10:39 AM   Recent Labs Lab 12/03/16 0155 12/04/16 0325 12/05/16 0227  NA 133* 136 134*  K 4.5 4.8 4.9  CL 96* 97* 97*  CO2 24 27 26   GLUCOSE 102* 113* 122*  BUN 88* 89* 90*  CREATININE  3.17* 3.21* 3.09*  CALCIUM 8.4* 8.5* 8.6*    Recent Labs Lab 12/01/16 0229 12/02/16 0305 12/03/16 0155  WBC 7.0 7.1 9.3  HGB 10.6* 10.7* 10.5*  HCT 32.3* 32.5* 33.4*  MCV 90.7 91.8 92.0  PLT 234 247 285

## 2016-12-05 NOTE — Progress Notes (Signed)
Pt appeared to be in Afib on the monitor. EKG obtained. Pt mildly symptomatic (said she could tell she was in "fib"). HR never over 110. Pt converted to NSR within an hour.

## 2016-12-06 LAB — BASIC METABOLIC PANEL
ANION GAP: 11 (ref 5–15)
BUN: 86 mg/dL — AB (ref 6–20)
CO2: 28 mmol/L (ref 22–32)
Calcium: 8.8 mg/dL — ABNORMAL LOW (ref 8.9–10.3)
Chloride: 96 mmol/L — ABNORMAL LOW (ref 101–111)
Creatinine, Ser: 2.84 mg/dL — ABNORMAL HIGH (ref 0.44–1.00)
GFR calc Af Amer: 17 mL/min — ABNORMAL LOW (ref >60)
GFR, EST NON AFRICAN AMERICAN: 15 mL/min — AB (ref >60)
GLUCOSE: 112 mg/dL — AB (ref 65–99)
POTASSIUM: 4.6 mmol/L (ref 3.5–5.1)
Sodium: 135 mmol/L (ref 135–145)

## 2016-12-06 LAB — CBC
HEMATOCRIT: 30.6 % — AB (ref 36.0–46.0)
HEMOGLOBIN: 9.7 g/dL — AB (ref 12.0–15.0)
MCH: 29.1 pg (ref 26.0–34.0)
MCHC: 31.7 g/dL (ref 30.0–36.0)
MCV: 91.9 fL (ref 78.0–100.0)
Platelets: 270 10*3/uL (ref 150–400)
RBC: 3.33 MIL/uL — ABNORMAL LOW (ref 3.87–5.11)
RDW: 14 % (ref 11.5–15.5)
WBC: 8.4 10*3/uL (ref 4.0–10.5)

## 2016-12-06 LAB — PROTIME-INR
INR: 1.62
Prothrombin Time: 19.4 seconds — ABNORMAL HIGH (ref 11.4–15.2)

## 2016-12-06 MED ORDER — WARFARIN SODIUM 2.5 MG PO TABS
2.5000 mg | ORAL_TABLET | Freq: Once | ORAL | Status: AC
  Administered 2016-12-06: 2.5 mg via ORAL
  Filled 2016-12-06: qty 1

## 2016-12-06 MED ORDER — FUROSEMIDE 80 MG PO TABS
160.0000 mg | ORAL_TABLET | Freq: Two times a day (BID) | ORAL | Status: DC
Start: 2016-12-06 — End: 2016-12-07
  Administered 2016-12-06 – 2016-12-07 (×2): 160 mg via ORAL
  Filled 2016-12-06 (×2): qty 2

## 2016-12-06 NOTE — Progress Notes (Signed)
Reviewed patient safety plan and use of bed alarm at bedtime.  Pt declined the use of bed alarm and verbalized understanding to call for assistance prior to getting out of bed.  Pt call bell and phone within reach, bed in lowest position with wheels locked.  Will continue to round and monitor pt closely.

## 2016-12-06 NOTE — Progress Notes (Signed)
ANTICOAGULATION CONSULT NOTE - Follow Up Consult  Pharmacy Consult for Warfarin Indication: atrial fibrillation  Allergies  Allergen Reactions  . Clonidine Derivatives Palpitations and Other (See Comments)    Very sedated  . Codeine Nausea Only  . Nickel Rash  . Sulfa Antibiotics Other (See Comments)    Reaction unknown    Patient Measurements: Height: 4\' 11"  (149.9 cm) Weight: 111 lb (50.3 kg) IBW/kg (Calculated) : 43.2  Vital Signs: Temp: 98.4 F (36.9 C) (07/22 0750) Temp Source: Oral (07/22 0750) BP: 164/62 (07/22 0750) Pulse Rate: 66 (07/22 0750)  Labs:  Recent Labs  12/04/16 0325 12/05/16 0227 12/06/16 0516  LABPROT 14.6 14.5 19.4*  INR 1.14 1.13 1.62  CREATININE 3.21* 3.09*  --     Estimated Creatinine Clearance: 9.7 mL/min (A) (by C-G formula based on SCr of 3.09 mg/dL (H)).  Assessment: 18 yoF with left kidney atrophy, retinal artery occlusion in left eye, stage 3 chronic renal insufficiency, history of subarachnoid hemorrhage, paroxysmal atrial fibrillation on apixaban. She presented to the ED 7/15 with dyspnea, and lower extremity edema.  Patient was on apixaban prior to admit but with worsening renal function she as transitioned to warfarin 7/19. Will be cautious on dosing given new amiodarone added and age.  -INR subtherapeutic at 1.62. No bleeding noted. CBC stable.   Goal of Therapy:  INR 2-3 Monitor platelets by anticoagulation protocol: Yes   Plan:  Warfarin 2.5 mg PO x1 tonight Daily INR Monitor CBC  Bridgett Larsson, PharmD, MS PGY1 Pharmacy Resident 12/06/2016,11:42 AM

## 2016-12-06 NOTE — Progress Notes (Signed)
Admit: 11/30/2016 LOS: 6  12F CKD4 with hypervolemia, acute CHF exacerbation  Subjective:  Good UOP in past 24h, Edema further improved; feels better; less dyspnea AM labs pending  07/21 0701 - 07/22 0700 In: 7628 [P.O.:1200; IV Piggyback:62] Out: 3151 [Urine:1650]  Filed Weights   12/04/16 0400 12/05/16 0606 12/06/16 0413  Weight: 51.7 kg (113 lb 14.4 oz) 50.9 kg (112 lb 3.2 oz) 50.3 kg (111 lb)    Scheduled Meds: . amiodarone  200 mg Oral BID  . carvedilol  3.125 mg Oral BID WC  . feeding supplement (ENSURE ENLIVE)  237 mL Oral BID BM  . hydrALAZINE  100 mg Oral TID  . mouth rinse  15 mL Mouth Rinse BID  . patient's guide to using coumadin book   Does not apply Once  . potassium chloride  20 mEq Oral Daily  . pravastatin  40 mg Oral QPM  . vitamin C  500 mg Oral Daily  . Warfarin - Pharmacist Dosing Inpatient   Does not apply q1800   Continuous Infusions: . furosemide 120 mg (12/06/16 0934)   PRN Meds:.acetaminophen, morphine injection, ondansetron (ZOFRAN) IV, ondansetron, oxyCODONE-acetaminophen, phenol, senna  Current Labs: reviewed    Physical Exam:  Blood pressure (!) 164/62, pulse 66, temperature 98.4 F (36.9 C), temperature source Oral, resp. rate 16, height 4\' 11"  (1.499 m), weight 50.3 kg (111 lb), SpO2 98 %. NAD RRR CTAB 1+ edema, less pitting, less tight S/NT/ND  A 1. CKD4, stable GFR, lytes ok 2. Hypervolemia, acute CHF exacerbaiton; volume status improving 3. AFib and bradycardia s/p PPM  4. HTN, amlodipine stopped  P 1. Change to PO lasix, 160mg  PO BID 2. If stable in AM ok for renal discharge with close f/u with Dr. Florene Glen 3. Daily weights, Daily Renal Panel, Strict I/Os, Avoid nephrotoxins (NSAIDs, judicious IV Contrast)    Pearson Grippe MD 12/06/2016, 10:24 AM   Recent Labs Lab 12/03/16 0155 12/04/16 0325 12/05/16 0227  NA 133* 136 134*  K 4.5 4.8 4.9  CL 96* 97* 97*  CO2 24 27 26   GLUCOSE 102* 113* 122*  BUN 88* 89* 90*   CREATININE 3.17* 3.21* 3.09*  CALCIUM 8.4* 8.5* 8.6*    Recent Labs Lab 12/01/16 0229 12/02/16 0305 12/03/16 0155  WBC 7.0 7.1 9.3  HGB 10.6* 10.7* 10.5*  HCT 32.3* 32.5* 33.4*  MCV 90.7 91.8 92.0  PLT 234 247 285

## 2016-12-06 NOTE — Progress Notes (Signed)
Subjective:  She feels better today and is less short of breath.  Renal function remained significantly impaired but is diuresed with intravenous Lasix.  The addition of amiodarone seemed to have settled down the atrial fibrillation yesterday.  Objective:  Vital Signs in the last 24 hours: BP (!) 164/62 (BP Location: Right Arm)   Pulse 66   Temp 98.4 F (36.9 C) (Oral)   Resp 16   Ht 4\' 11"  (1.499 m)   Wt 50.3 kg (111 lb)   SpO2 98%   BMI 22.42 kg/m   Physical Exam: Thin elderly female in no acute distress Lungs:  Clear  Cardiac:  Regular rhythm, normal S1 and S2, no S3 Extremities:  Changes of chronic venous insufficiency noted, 2+ edema.  Intake/Output from previous day: 07/21 0701 - 07/22 0700 In: 1262 [P.O.:1200; IV Piggyback:62] Out: 2902 [Urine:1650] Weight Filed Weights   12/04/16 0400 12/05/16 0606 12/06/16 0413  Weight: 51.7 kg (113 lb 14.4 oz) 50.9 kg (112 lb 3.2 oz) 50.3 kg (111 lb)    Lab Results: Basic Metabolic Panel:  Recent Labs  12/04/16 0325 12/05/16 0227  NA 136 134*  K 4.8 4.9  CL 97* 97*  CO2 27 26  GLUCOSE 113* 122*  BUN 89* 90*  CREATININE 3.21* 3.09*    CBC: No results for input(s): WBC, NEUTROABS, HGB, HCT, MCV, PLT in the last 72 hours.  BNP    Component Value Date/Time   BNP >4,500.0 (H) 11/30/2016 1331   PROTIME: Lab Results  Component Value Date   INR 1.62 12/06/2016   INR 1.13 12/05/2016   INR 1.14 12/04/2016    Telemetry: Sinus rhythm.  Atrial fibrillation frequency markedly reduced..  Assessment/Plan:  1.  Recurrent paroxysmal atrial fibrillation-improved with the addition of amiodarone 2.  Acute on chronic diastolic heart failure 3.  Pulmonary hypertension 4.  Tachycardia-bradycardia syndrome with recent pacemaker insertion 5.  Acute on chronic renal failure clinically improved  Recommendations:  Continue management of diuretics per nephrology.  Continue amiodarone at current dose.       Kerry Hough  MD [facility]  12/06/2016, 10:26 AM

## 2016-12-06 NOTE — Progress Notes (Signed)
PROGRESS NOTE  Valerie Terrell  UJW:[id number] DOB: 02-17-1933 DOA: 11/30/2016 PCP: Tammi Sou, MD   Brief Narrative: Valerie Terrell is a 83 y.o. female with medical history significant of left kidney atrophy, retinal artery occlusion in the left eye, stage III chronic renal insufficiency, history of subarachnoid hemorrhage, hyperlipidemia, hypertension, lumbar spondylosis, paroxysmal atrial fibrillation on apixaban who presented to the ED 7/16 with progressively worse dyspnea, lower extremity edema, orthopnea and weakness for the past 2 weeks, but particularly worse in over the weekend. She has also had mild nausea, occasional palpitations and dizziness. Initial vital signs in the emergency department temperature 97.46F, pulse 63, blood pressure 155/67 mmHg, respirations 22 and O2 sat 93% on room air. Her WBC is 7.0, hemoglobin 11.3 g/dL and platelets 246. Her troponin level was 0.11, BNP over 4500, sodium 130, potassium 3.9, chloride 92, bicarbonate 24 mmol/L. Her BUN was 93, creatinine 4.21 up from 2.4 a week ago. Her chest radiograph shows interstitial and mild alveolar edema with left pleural effusion.  IV lasix was administered and the patient was admitted for ongoing diuresis.   Assessment & Plan: Principal Problem:   Pulmonary edema Active Problems:   PAF (paroxysmal atrial fibrillation) (HCC)   Essential hypertension   Acute renal failure superimposed on chronic kidney disease (HCC)   Hyperlipidemia   Elevated troponin level   Anemia   Acute respiratory failure with hypoxia (HCC)   Pacemaker  Acute hypoxic respiratory failure due to pulmonary edema: Due to volume overload from acute renal failure. BNP >4500.  - Continue supplemental oxygen prn - Repeat echo EF 50-55% per Dr. Gwenlyn Found (see his note).  - Pt is diuresing and feeling better.   Tachy-Brady Syndrome - Pt had a pacemaker placed today 12/03/16.  She is now recovering from procedure, post op mgmt per Psychologist, sport and exercise. She says she  is feeling better.  Pt started on amiodarone and tolerating so far.   Acute renal failure superimposed on stage III CKD: Follows with Dr. Florene Glen as outpatient. SCr 2.51 at recent discharge for similar presentation, up to 4.21 on admission. Uncertain cause, though she may not be taking diuretics as directed.  - Lasix dose was adjusted by nephrology team with good results and now diuresing better.  Creatinine improving.   - Monitor BMP and UOP. Creatinine improved from admission.    Pt changed to oral lasix dosing per nephrology.    Elevated troponin: Flat trend, likely demand ischemia with decreased clearance, though pt is having chest pain. ECG appears nonischemic.  - stable.   Essential hypertension - Continue amlodipine (5mg  BID), hydralazine 100mg  TID, atenolol 100mg . Chlorthalidone stopped at last admission in March 2018. - Above goal, titrate as indicated with diuresis.   PAF: with documented pauses  - CHA2DS2-VASc Scoreat least 5.  - Continue atenolol, currently NSR - warfarin for anticoagulation, pharmacy helping to manage dosing.    Hyperlipidemia: Chronic, stable.  - Continue statin  Anemia of chronic disease:  - monitor CBC   DVT prophylaxis: Heparin IV Code Status: Full Family Communication: Daughter at bedside  Disposition Plan:  PT/OT recommending HHPT   Consultants:   Cardiology  Procedures:   None  Antimicrobials:  None   Subjective: Pt says she feels better today.   Less edema in legs.    Objective: Vitals:   12/05/16 2001 12/06/16 0413 12/06/16 0750 12/06/16 1506  BP: (!) 155/61 131/87 (!) 164/62 (!) 163/63  Pulse:  89 66 69  Resp:  15 16 18   Temp: 98  F (36.7 C) 97.7 F (36.5 C) 98.4 F (36.9 C) 98.9 F (37.2 C)  TempSrc: Oral Oral Oral Oral  SpO2:  97% 98% 94%  Weight:  50.3 kg (111 lb)    Height:        Intake/Output Summary (Last 24 hours) at 12/06/16 1623 Last data filed at 12/06/16 0423  Gross per 24 hour  Intake               662 ml  Output             1050 ml  Net             -388 ml   Filed Weights   12/04/16 0400 12/05/16 0606 12/06/16 0413  Weight: 51.7 kg (113 lb 14.4 oz) 50.9 kg (112 lb 3.2 oz) 50.3 kg (111 lb)   General exam: 83 y.o. female sitting up in bed, mild discomfort Respiratory system:  3L by [place] without accessory muscle use. Bibasilar crackles.  Cardiovascular system: normal s1, s2 sounds. No murmur, rub, or gallop. No JVD, and 2+ pitting LE edema. Gastrointestinal system: Abdomen soft, non-tender, non-distended, with normoactive bowel sounds. No organomegaly or masses felt. Central nervous system: Alert and oriented. No focal neurological deficits. Extremities: 1+ edema bilateral LEs.  Warm, no deformities.  Skin: No rashes, lesions no ulcers.   Psychiatry: Judgement and insight appear normal. Mood & affect appropriate.   Data Reviewed: I have personally reviewed following labs and imaging studies  CBC:   Recent Labs Lab 11/30/16 1331 12/01/16 0229 12/02/16 0305 12/03/16 0155 12/06/16 1112  WBC 7.0 7.0 7.1 9.3 8.4  HGB 11.3* 10.6* 10.7* 10.5* 9.7*  HCT 34.5* 32.3* 32.5* 33.4* 30.6*  MCV 90.6 90.7 91.8 92.0 91.9  PLT 246 234 247 285 696   Basic Metabolic Panel:  Recent Labs Lab 12/02/16 0305 12/03/16 0155 12/04/16 0325 12/05/16 0227 12/06/16 1112  NA 134* 133* 136 134* 135  K 3.2* 4.5 4.8 4.9 4.6  CL 95* 96* 97* 97* 96*  CO2 27 24 27 26 28   GLUCOSE 114* 102* 113* 122* 112*  BUN 91* 88* 89* 90* 86*  CREATININE 3.23* 3.17* 3.21* 3.09* 2.84*  CALCIUM 8.3* 8.4* 8.5* 8.6* 8.8*   GFR: Estimated Creatinine Clearance: 10.6 mL/min (A) (by C-G formula based on SCr of 2.84 mg/dL (H)).  Cardiac Enzymes:  Recent Labs Lab 11/30/16 1331 11/30/16 2033 12/01/16 0229  TROPONINI 0.11* 0.09* 0.09*   CBG:  Recent Labs Lab 11/30/16 1617 12/03/16 0646  GLUCAP 125* 104*   Urine analysis:    Component Value Date/Time   COLORURINE YELLOW 11/30/2016 1610   APPEARANCEUR  CLEAR 11/30/2016 1610   LABSPEC 1.013 11/30/2016 1610   PHURINE 5.0 11/30/2016 1610   GLUCOSEU NEGATIVE 11/30/2016 1610   HGBUR NEGATIVE 11/30/2016 1610   BILIRUBINUR NEGATIVE 11/30/2016 1610   BILIRUBINUR negative 09/24/2016 [place] 11/30/2016 1610   PROTEINUR 30 (A) 11/30/2016 1610   UROBILINOGEN 0.2 09/24/2016 1453   NITRITE NEGATIVE 11/30/2016 1610   LEUKOCYTESUR SMALL (A) 11/30/2016 1610   Radiology Studies: No results found.  LOS: 6 days    Time spent: 19 minutes  Irwin Brakeman, MD [facility] Pager [phone]  If 7PM-7AM, please contact night-coverage [email or web address] Password [facility] 12/06/2016, 4:23 PM

## 2016-12-06 NOTE — Progress Notes (Signed)
Pt oxygen saturations noted to drop in the upper 70s when pt has oxygen off and on room air, oxygen level reapplied with saturations at 100% will continue to monitor.

## 2016-12-07 ENCOUNTER — Telehealth: Payer: Self-pay | Admitting: Pharmacist

## 2016-12-07 DIAGNOSIS — R748 Abnormal levels of other serum enzymes: Secondary | ICD-10-CM

## 2016-12-07 DIAGNOSIS — I428 Other cardiomyopathies: Secondary | ICD-10-CM

## 2016-12-07 DIAGNOSIS — N189 Chronic kidney disease, unspecified: Secondary | ICD-10-CM

## 2016-12-07 DIAGNOSIS — Z950 Presence of cardiac pacemaker: Secondary | ICD-10-CM

## 2016-12-07 DIAGNOSIS — I5041 Acute combined systolic (congestive) and diastolic (congestive) heart failure: Secondary | ICD-10-CM

## 2016-12-07 DIAGNOSIS — I501 Left ventricular failure: Secondary | ICD-10-CM

## 2016-12-07 DIAGNOSIS — N179 Acute kidney failure, unspecified: Secondary | ICD-10-CM

## 2016-12-07 LAB — BASIC METABOLIC PANEL
Anion gap: 14 (ref 5–15)
BUN: 86 mg/dL — AB (ref 6–20)
CO2: 23 mmol/L (ref 22–32)
CREATININE: 2.84 mg/dL — AB (ref 0.44–1.00)
Calcium: 8.9 mg/dL (ref 8.9–10.3)
Chloride: 96 mmol/L — ABNORMAL LOW (ref 101–111)
GFR calc Af Amer: 17 mL/min — ABNORMAL LOW (ref >60)
GFR, EST NON AFRICAN AMERICAN: 15 mL/min — AB (ref >60)
GLUCOSE: 91 mg/dL (ref 65–99)
POTASSIUM: 5.4 mmol/L — AB (ref 3.5–5.1)
Sodium: 133 mmol/L — ABNORMAL LOW (ref 135–145)

## 2016-12-07 LAB — PROTIME-INR
INR: 2.44
Prothrombin Time: 26.9 seconds — ABNORMAL HIGH (ref 11.4–15.2)

## 2016-12-07 MED ORDER — CARVEDILOL 6.25 MG PO TABS: 6.2500 mg | ORAL_TABLET | Freq: Two times a day (BID) | ORAL | Status: DC

## 2016-12-07 MED ORDER — ENSURE ENLIVE PO LIQD: 237.0000 mL | Freq: Two times a day (BID) | ORAL | 12 refills | Status: DC

## 2016-12-07 MED ORDER — FUROSEMIDE 80 MG PO TABS: 160.0000 mg | ORAL_TABLET | Freq: Two times a day (BID) | ORAL | 0 refills | Status: DC

## 2016-12-07 MED ORDER — CARVEDILOL 6.25 MG PO TABS: 6.2500 mg | ORAL_TABLET | Freq: Two times a day (BID) | ORAL | 0 refills | Status: DC

## 2016-12-07 MED ORDER — WARFARIN SODIUM 3 MG PO TABS: 3.0000 mg | ORAL_TABLET | Freq: Every day | ORAL | 0 refills | Status: DC

## 2016-12-07 MED ORDER — AMIODARONE HCL 200 MG PO TABS: 200.0000 mg | ORAL_TABLET | Freq: Two times a day (BID) | ORAL | 0 refills | Status: DC

## 2016-12-07 NOTE — Discharge Summary (Signed)
Physician Discharge Summary  Carlo Lorson JME:[id number] DOB: February 07, 1933 DOA: 11/30/2016  PCP: Tammi Sou, MD Nephrologist; Dr. Florene Glen Cardiologist: Dr. Gwenlyn Found  Admit date: 11/30/2016 Discharge date: 12/07/2016  Admitted From: HOME  Disposition: HOME   Recommendations for Outpatient Follow-up:  1. Follow up with PCP in 3 days to check PT/INR  2. Home Health RN to check PT/INR in 2 days and send results to PCP and/or cardiology 3. Follow up with cardiologist in 1-2 weeks.  4. Evaluate ongoing need for home oxygen on outpatient follow up.  5. Please obtain BMP/CBC in 3-7 days.   Discharge Condition: Stable   CODE STATUS: FULL    Brief Hospitalization Summary: Please see all hospital notes, images, labs for full details of the hospitalization.  HPI: Valerie Terrell is a 83 y.o. female with medical history significant of left kidney atrophy, retinal artery occlusion in the left eye, stage IV chronic renal insufficiency, history of subarachnoid hemorrhage, hyperlipidemia, hypertension, lumbar spondylosis, paroxysmal atrial fibrillation on apixaban who is coming to the emergency department with complaints of progressively worse dyspnea, lower extremity edema, orthopnea and weakness for the past 2 weeks, but particularly worse in over the weekend. She has also had mild nausea, occasional palpitations and dizziness. She denies dietary indiscretions. She denies fever, chills, chest pain,  diaphoresis, abdominal pain, emesis, diarrhea, constipation, melena, hematochezia, dysuria, frequency or hematuria.  ED Course: Initial vital signs in the emergency department temperature 97.25F, pulse 63, blood pressure 155/67 mmHg, respirations 22 and O2 sat 93% on room air. Her WBC is 7.0, hemoglobin 11.3 g/dL and platelets 246. Her troponin level was 0.11, BNP over 4500, sodium 130, potassium 3.9, chloride 92, bicarbonate 24 mmol/L. Her BUN was 93, creatinine 4.21 and glucose 123 mg/dL. Her creatinine level  was 2.4 mg/dL a week ago. Her chest radiograph shows interstitial and mild alveolar edema with left pleural effusion.  Brief Narrative: Valerie Terrell a 83 y.o.femalewith medical history significant of left kidney atrophy, retinal artery occlusion in the left eye, stage III chronic renal insufficiency, history of subarachnoid hemorrhage, hyperlipidemia, hypertension, lumbar spondylosis, paroxysmal atrial fibrillation on apixabanwho presented to the ED 7/16 with progressively worse dyspnea, lower extremity edema, orthopnea and weakness for the past 2 weeks, but particularly worse in over the weekend. She has also had mild nausea, occasional palpitations and dizziness. Initial vital signs in the emergency department temperature 97.25F, pulse 63, blood pressure 155/67 mmHg, respirations 22 and O2 sat 93% on room air. Her WBC is 7.0, hemoglobin 11.3 g/dL and platelets 246. Her troponin level was 0.11, BNP over 4500, sodium 130, potassium 3.9, chloride 92, bicarbonate 24 mmol/L. Her BUN was 93, creatinine 4.21 up from 2.4 a week ago. Her chest radiograph shows interstitial and mild alveolar edema with left pleural effusion.  IV lasix was administered and the patient was admitted for ongoing diuresis.   Assessment & Plan: Principal Problem:   Pulmonary edema Active Problems:   PAF (paroxysmal atrial fibrillation) (HCC)   Essential hypertension   Acute renal failure superimposed on chronic kidney disease    Hyperlipidemia   Elevated troponin level   Anemia   Acute respiratory failure with hypoxia    Pacemaker  Acute hypoxic respiratory failure due to pulmonary edema: Due to volume overload from acute renal failure. BNP >4500.  - Continue supplemental oxygen and discharge on home oxygen.  Pt qualified for home oxygen prior to discharge.  Recommended close outpatient follow up.  - Repeat echo EF 50-55% per Dr. Gwenlyn Found (see  his note).  - Pt is diuresing well and feeling better.   Tachy-Brady  Syndrome - Pt had a pacemaker placed 12/03/16.  She is now recovering from procedure. She has appt to follow up with EP clinic after discharge. She says she is feeling better.  Pt started on amiodarone and tolerating so far.   Acute renal failure superimposed on stage III CKD: Follows with Dr. Florene Glen as outpatient. SCr 2.51 at recent discharge for similar presentation, up to 4.21 on admission. Uncertain cause, though she may not be taking diuretics as directed.  - Lasix dose was adjusted by nephrology team with good results and now diuresing better.  Creatinine improving.   - Monitor BMP and UOP. Creatinine improved from admission.    Pt changed to oral lasix dosing per nephrology.  Creatinine improved to baseline prior to discharge and remained stable at 2.84.     Elevated troponin: Flat trend, likely demand ischemia with decreased clearance, though pt is having chest pain. ECG appears nonischemic.  - stable.   Essential hypertension - Continue amlodipine (5mg  BID), hydralazine 100mg  TID, atenolol 100mg . Chlorthalidone stopped at last admission in March 2018. - Above goal, titrate as indicated with diuresis.   PAF: with documented pauses  - CHA2DS2-VASc Scoreat least 5.  - Continue atenolol, currently NSR - warfarin for anticoagulation, pharmacy helping to manage dosing.  She was therapeutic on day of discharge and will be prescribed 3 mg daily.    Hyperlipidemia: Chronic, stable.  - Continue statin  Anemia of chronic kidney disease:  - CBC stable at 8.  Recheck outpatient.  Follow up with nephrology.    DVT prophylaxis: Heparin IV Code Status: Full Family Communication: Daughter at bedside  Disposition Plan:  PT/OT recommending HHPT   Consultants:   Cardiology  Discharge Diagnoses:  Principal Problem:   Acute pulmonary edema with congestive heart failure (HCC) Active Problems:   PAF (paroxysmal atrial fibrillation) (HCC)   Essential hypertension   Acute renal failure  superimposed on chronic kidney disease (HCC)   Hyperlipidemia   Elevated troponin level   Anemia   Acute respiratory failure with hypoxia (HCC)   Cardiac pacemaker   Tachycardia-bradycardia syndrome ([place]) - s/p PPM   Nonischemic cardiomyopathy ([place]): EF 35-40%   Acute combined systolic and diastolic heart failure ([place]) - possibly related to A. fib  Discharge Instructions: Discharge Instructions    Increase activity slowly    Complete by:  As directed      Allergies as of 12/07/2016      Reactions   Clonidine Derivatives Palpitations, Other (See Comments)   Very sedated   Codeine Nausea Only   Nickel Rash   Sulfa Antibiotics Other (See Comments)   Reaction unknown      Medication List    STOP taking these medications   amLODipine 5 MG tablet Commonly known as:  NORVASC   apixaban 2.5 MG Tabs tablet Commonly known as:  ELIQUIS   atenolol 100 MG tablet Commonly known as:  TENORMIN   spironolactone 25 MG tablet Commonly known as:  ALDACTONE     TAKE these medications   acetaminophen 500 MG tablet Commonly known as:  TYLENOL Take 500 mg by mouth every 6 (six) hours as needed for moderate pain.   amiodarone 200 MG tablet Commonly known as:  PACERONE Take 1 tablet (200 mg total) by mouth 2 (two) times daily.   CALCIUM/VITAMIN D PO Take 1 tablet by mouth 2 (two) times daily.   carvedilol 6.25 MG  tablet Commonly known as:  COREG Take 1 tablet (6.25 mg total) by mouth 2 (two) times daily with a meal.   feeding supplement (ENSURE ENLIVE) Liqd Take 237 mLs by mouth 2 (two) times daily between meals.   fluticasone 0.05 % cream Commonly known as:  CUTIVATE Apply topically 2 (two) times daily.   furosemide 80 MG tablet Commonly known as:  LASIX Take 2 tablets (160 mg total) by mouth 2 (two) times daily. What changed:  how much to take  when to take this   hydrALAZINE 100 MG tablet Commonly known as:  APRESOLINE Take 1 tablet (100 mg total) by mouth 3  (three) times daily.   ibandronate 150 MG tablet Commonly known as:  BONIVA Take 1 tablet (150 mg total) by mouth every 30 (thirty) days. Take in the morning with a full glass of water, on an empty stomach, and do not take anything else by mouth or lie down for the next 30 min.   multivitamin tablet Take 1 tablet by mouth 3 (three) times a week.   ondansetron 4 MG tablet Commonly known as:  ZOFRAN Take 0.5-1 tablets (2-4 mg total) by mouth every 8 (eight) hours as needed for nausea or vomiting.   oxyCODONE-acetaminophen 5-325 MG tablet Commonly known as:  PERCOCET/ROXICET Take 1-2 tablets by mouth every 6 (six) hours as needed for severe pain.   pravastatin 40 MG tablet Commonly known as:  PRAVACHOL Take 40 mg by mouth every evening.   vitamin C 500 MG tablet Commonly known as:  ASCORBIC ACID Take 500 mg by mouth daily.   Vitamin D (Ergocalciferol) 50000 units Caps capsule Commonly known as:  DRISDOL 1 cap po q week x 12 weeks What changed:  how much to take  how to take this  when to take this  additional instructions   warfarin 3 MG tablet Commonly known as:  COUMADIN Take 1 tablet (3 mg total) by mouth daily.            Durable Medical Equipment        Start     Ordered   12/07/16 1348  For home use only DME oxygen  Once    Question Answer Comment  Mode or (Route) Nasal cannula   Liters per Minute 3   Frequency Continuous (stationary and portable oxygen unit needed)   Oxygen conserving device Yes   Oxygen delivery system Gas      12/07/16 1349     Follow-up Information    [facility] Follow up on 12/16/2016.   Specialty:  Cardiology Why:  9:00AM, wound check Contact information: [address]       Evans Lance, MD Follow up on 03/10/2017.   Specialty:  Cardiology Why:  10:00AM Contact information: [address] [place]  [address] [facility].   Why:  Physical/ Occupational Therapy Contact information: [address] [place]  [address] [phone]        Estanislado Emms, MD. Schedule an appointment as soon as possible for a visit in 2 week(s).   Specialty:  Nephrology Why:  Hospital Follow Up  Contact information: [place] [address] [phone]  Lorretta Harp, MD. Schedule an appointment as soon as possible for a visit in 2 week(s).   Specialties:  Cardiology, Radiology Why:  Hospital Follow Up  Contact information: [address] [place] [address] [phone]        Tammi Sou, MD. Schedule an appointment as soon as possible for a visit in 3 day(s).   Specialty:  Family Medicine Why:  Hospital Follow Up, check PT/INR on warfarin.  Contact information: [address] [place] [address] [phone]          Allergies  Allergen Reactions  . Clonidine Derivatives Palpitations and Other (See Comments)    Very sedated  . Codeine Nausea Only  . Nickel Rash  . Sulfa Antibiotics Other (See Comments)    Reaction unknown   Current Discharge Medication List    START taking these medications   Details  amiodarone (PACERONE) 200 MG tablet Take 1 tablet (200 mg total) by mouth 2 (two) times daily. Qty: 60 tablet, Refills: 0    carvedilol (COREG) 6.25 MG tablet Take 1 tablet (6.25 mg total) by mouth 2 (two) times daily with a meal. Qty: 60 tablet, Refills: 0    feeding supplement, ENSURE ENLIVE, (ENSURE ENLIVE) LIQD Take 237 mLs by mouth 2 (two) times daily between meals. Qty: 237 mL, Refills: 12    warfarin (COUMADIN) 3 MG tablet Take 1 tablet (3 mg total) by mouth daily. Qty: 10 tablet, Refills: 0      CONTINUE these medications which have CHANGED   Details  furosemide (LASIX) 80 MG tablet Take 2 tablets (160 mg total) by mouth  2 (two) times daily. Qty: 120 tablet, Refills: 0      CONTINUE these medications which have NOT CHANGED   Details  acetaminophen (TYLENOL) 500 MG tablet Take 500 mg by mouth every 6 (six) hours as needed for moderate pain.    Calcium Carb-Cholecalciferol (CALCIUM/VITAMIN D PO) Take 1 tablet by mouth 2 (two) times daily.    fluticasone (CUTIVATE) 0.05 % cream Apply topically 2 (two) times daily. Qty: 30 g, Refills: 1    hydrALAZINE (APRESOLINE) 100 MG tablet Take 1 tablet (100 mg total) by mouth 3 (three) times daily. Qty: 30 tablet, Refills: 0    ibandronate (BONIVA) 150 MG tablet Take 1 tablet (150 mg total) by mouth every 30 (thirty) days. Take in the morning with a full glass of water, on an empty stomach, and do not take anything else by mouth or lie down for the next 30 min. Qty: 3 tablet, Refills: 3    Multiple Vitamin (MULTIVITAMIN) tablet Take 1 tablet by mouth 3 (three) times a week.     ondansetron (ZOFRAN) 4 MG tablet Take 0.5-1 tablets (2-4 mg total) by mouth every 8 (eight) hours as needed for nausea or vomiting. Qty: 30 tablet, Refills: 3    oxyCODONE-acetaminophen (PERCOCET/ROXICET) 5-325 MG tablet Take 1-2 tablets by mouth every 6 (six) hours as needed for severe pain. Qty: 60 tablet, Refills: 0    pravastatin (PRAVACHOL) 40 MG tablet Take 40 mg by mouth every evening.    vitamin C (ASCORBIC ACID) 500 MG tablet Take 500 mg by mouth daily.    Vitamin D, Ergocalciferol, (DRISDOL) 50000 units CAPS capsule 1 cap po q week x 12 weeks Qty: 12 capsule, Refills: 1      STOP taking these medications     amLODipine (NORVASC) 5 MG tablet      apixaban (ELIQUIS)  2.5 MG TABS tablet      atenolol (TENORMIN) 100 MG tablet      spironolactone (ALDACTONE) 25 MG tablet         Procedures/Studies: Dg Chest 2 View  Result Date: 12/04/2016 CLINICAL DATA:  Pacemaker placement EXAM: CHEST  2 VIEW COMPARISON:  11/30/2016 FINDINGS: The heart is mildly enlarged. Double lead  left subclavian pacemaker device has been placed. Tips of the leads are in the right atrium and right ventricle. There is no ensuing pneumothorax. Patchy right lower lung zone opacities are stable. Left pleural effusion is small and stable. Linear opacities at the left base are stable. Osteopenia. Mid-level thoracic vertebroplasty changes. There are compression fractures in both vertebral bodies about the augmented mid-level thoracic vertebral body. These bony findings are stable. IMPRESSION: Left subclavian pacemaker device placement without complication or pneumothorax. Stable right basilar pulmonary opacities Stable left pleural effusion and basilar atelectasis for scarring. Electronically Signed   By: Marybelle Killings M.D.   On: 12/04/2016 07:50   Dg Chest 2 View  Result Date: 11/30/2016 CLINICAL DATA:  83 y/o  F; shortness of breath. EXAM: CHEST  2 VIEW COMPARISON:  08/05/2016 chest radiograph FINDINGS: Stable moderate cardiomegaly. Aortic atherosclerosis with calcification. Stable small left pleural effusion. Stable left lung base opacity. Interstitial pulmonary edema. Increased perihilar haziness probably represents developing alveolar edema. Midthoracic compression deformity post kyphoplasty. IMPRESSION: Interstitial and mild alveolar pulmonary edema. Small left pleural effusion with left basilar opacity probably representing associated atelectasis. Stable cardiomegaly. Electronically Signed   By: Kristine Garbe M.D.   On: 11/30/2016 17:57      Subjective: Pt says she feels a lot better and the swelling in her legs is much improved.   Discharge Exam: Vitals:   12/07/16 0953 12/07/16 1238  BP: (!) 162/68 (!) 158/65  Pulse: 74   Resp:    Temp:  98.8 F (37.1 C)   Vitals:   12/06/16 1957 12/07/16 0500 12/07/16 0953 12/07/16 1238  BP: (!) 156/83 (!) 161/71 (!) 162/68 (!) 158/65  Pulse: 67 68 74   Resp:      Temp: 98.4 F (36.9 C) 98.7 F (37.1 C)  98.8 F (37.1 C)  TempSrc:  Oral Oral    SpO2: 100% 92%    Weight:  50.1 kg (110 lb 8 oz)    Height:       General: Pt is alert, awake, not in acute distress, ambulating with assistance.  Cardiovascular: normal S1/S2 +, no rubs, no gallops Respiratory: CTA bilaterally, no wheezing, no rhonchi Abdominal: Soft, NT, ND, bowel sounds + Extremities: no cyanosis, decreased edema in legs.    The results of significant diagnostics from this hospitalization (including imaging, microbiology, ancillary and laboratory) are listed below for reference.     Microbiology: Recent Results (from the past 240 hour(s))  MRSA PCR Screening     Status: None   Collection Time: 11/30/16 10:27 PM  Result Value Ref Range Status   MRSA by PCR NEGATIVE NEGATIVE Final    Comment:        The GeneXpert MRSA Assay (FDA approved for NASAL specimens only), is one component of a comprehensive MRSA colonization surveillance program. It is not intended to diagnose MRSA infection nor to guide or monitor treatment for MRSA infections.   Surgical pcr screen     Status: None   Collection Time: 12/03/16  3:58 AM  Result Value Ref Range Status   MRSA, PCR NEGATIVE NEGATIVE Final   Staphylococcus aureus NEGATIVE  NEGATIVE Final    Comment:        The Xpert SA Assay (FDA approved for NASAL specimens in patients over 56 years of age), is one component of a comprehensive surveillance program.  Test performance has been validated by [facility] for patients greater than or equal to 52 year old. It is not intended to diagnose infection nor to guide or monitor treatment.      Labs: BNP (last 3 results)  Recent Labs  07/13/16 0144 07/22/16 1749 11/30/16 1331  BNP 926.6* 887.7* >6,761.9*   Basic Metabolic Panel:  Recent Labs Lab 12/03/16 0155 12/04/16 0325 12/05/16 0227 12/06/16 1112 12/07/16 0332  NA 133* 136 134* 135 133*  K 4.5 4.8 4.9 4.6 5.4*  CL 96* 97* 97* 96* 96*  CO2 24 27 26 28 23   GLUCOSE 102* 113* 122* 112* 91   BUN 88* 89* 90* 86* 86*  CREATININE 3.17* 3.21* 3.09* 2.84* 2.84*  CALCIUM 8.4* 8.5* 8.6* 8.8* 8.9   Liver Function Tests:  Recent Labs Lab 12/04/16 1417  ALBUMIN 2.8*   No results for input(s): LIPASE, AMYLASE in the last 168 hours. No results for input(s): AMMONIA in the last 168 hours. CBC:  Recent Labs Lab 12/01/16 0229 12/02/16 0305 12/03/16 0155 12/06/16 1112  WBC 7.0 7.1 9.3 8.4  HGB 10.6* 10.7* 10.5* 9.7*  HCT 32.3* 32.5* 33.4* 30.6*  MCV 90.7 91.8 92.0 91.9  PLT 234 247 285 270   Cardiac Enzymes:  Recent Labs Lab 11/30/16 2033 12/01/16 0229  TROPONINI 0.09* 0.09*   BNP: Invalid input(s): POCBNP CBG:  Recent Labs Lab 11/30/16 1617 12/03/16 0646  GLUCAP 125* 104*   D-Dimer No results for input(s): DDIMER in the last 72 hours. Hgb A1c No results for input(s): HGBA1C in the last 72 hours. Lipid Profile No results for input(s): CHOL, HDL, LDLCALC, TRIG, CHOLHDL, LDLDIRECT in the last 72 hours. Thyroid function studies No results for input(s): TSH, T4TOTAL, T3FREE, THYROIDAB in the last 72 hours.  Invalid input(s): FREET3 Anemia work up No results for input(s): VITAMINB12, FOLATE, FERRITIN, TIBC, IRON, RETICCTPCT in the last 72 hours. Urinalysis    Component Value Date/Time   COLORURINE YELLOW 11/30/2016 1610   APPEARANCEUR CLEAR 11/30/2016 1610   LABSPEC 1.013 11/30/2016 1610   PHURINE 5.0 11/30/2016 1610   GLUCOSEU NEGATIVE 11/30/2016 1610   HGBUR NEGATIVE 11/30/2016 1610   BILIRUBINUR NEGATIVE 11/30/2016 1610   BILIRUBINUR negative 09/24/2016 1453   KETONESUR NEGATIVE 11/30/2016 1610   PROTEINUR 30 (A) 11/30/2016 1610   UROBILINOGEN 0.2 09/24/2016 1453   NITRITE NEGATIVE 11/30/2016 1610   LEUKOCYTESUR SMALL (A) 11/30/2016 1610   Sepsis Labs Invalid input(s): PROCALCITONIN,  WBC,  LACTICIDVEN Microbiology Recent Results (from the past 240 hour(s))  MRSA PCR Screening     Status: None   Collection Time: 11/30/16 10:27 PM  Result  Value Ref Range Status   MRSA by PCR NEGATIVE NEGATIVE Final    Comment:        The GeneXpert MRSA Assay (FDA approved for NASAL specimens only), is one component of a comprehensive MRSA colonization surveillance program. It is not intended to diagnose MRSA infection nor to guide or monitor treatment for MRSA infections.   Surgical pcr screen     Status: None   Collection Time: 12/03/16  3:58 AM  Result Value Ref Range Status   MRSA, PCR NEGATIVE NEGATIVE Final   Staphylococcus aureus NEGATIVE NEGATIVE Final    Comment:  The Xpert SA Assay (FDA approved for NASAL specimens in patients over 89 years of age), is one component of a comprehensive surveillance program.  Test performance has been validated by [facility] for patients greater than or equal to 39 year old. It is not intended to diagnose infection nor to guide or monitor treatment.     Time coordinating discharge: 40 minutes  SIGNED:  Irwin Brakeman, MD  [facility] 12/07/2016, 2:15 PM Pager [phone]  If 7PM-7AM, please contact night-coverage [email or web address] Password [email or web address]

## 2016-12-07 NOTE — Progress Notes (Signed)
ANTICOAGULATION CONSULT NOTE - Follow Up Consult  Pharmacy Consult for Warfarin Indication: atrial fibrillation  Allergies  Allergen Reactions  . Clonidine Derivatives Palpitations and Other (See Comments)    Very sedated  . Codeine Nausea Only  . Nickel Rash  . Sulfa Antibiotics Other (See Comments)    Reaction unknown    Patient Measurements: Height: 4\' 11"  (149.9 cm) Weight: 110 lb 8 oz (50.1 kg) IBW/kg (Calculated) : 43.2  Vital Signs: Temp: 98.8 F (37.1 C) (07/23 1238) Temp Source: Oral (07/23 0500) BP: 158/65 (07/23 1238) Pulse Rate: 74 (07/23 0953)  Labs:  Recent Labs  12/05/16 0227 12/06/16 0516 12/06/16 1112 12/07/16 0332  HGB  --   --  9.7*  --   HCT  --   --  30.6*  --   PLT  --   --  270  --   LABPROT 14.5 19.4*  --  26.9*  INR 1.13 1.62  --  2.44  CREATININE 3.09*  --  2.84* 2.84*    Estimated Creatinine Clearance: 10.6 mL/min (A) (by C-G formula based on SCr of 2.84 mg/dL (H)).  Assessment: 67 yoF with left kidney atrophy, retinal artery occlusion in left eye, stage 3 chronic renal insufficiency, history of subarachnoid hemorrhage, paroxysmal atrial fibrillation on apixaban. She presented to the ED 7/15 with dyspnea, and lower extremity edema.  Patient was on apixaban prior to admit but with worsening renal function she as transitioned to warfarin 7/19. Will be cautious on dosing given new amiodarone added and age.  -INR has trended up quickly and is now at goal 1.6>>2.4. No bleeding noted. CBC stable. Given current trend in INR unfortunately will need to hold warfarin tonight and consider low dose at discharge closer to 2mg  daily with the possibility of 1mg  a few days a week.   Goal of Therapy:  INR 2-3 Monitor platelets by anticoagulation protocol: Yes   Plan:  Hold warfarin tonight If sent home tonight would suggest - 2mg  daily starting 7/24 with INR check later this week  Erin Hearing PharmD., BCPS Clinical Pharmacist Pager  [phone] 12/07/2016 2:17 PM

## 2016-12-07 NOTE — Progress Notes (Signed)
Physical Therapy Treatment Patient Details Name: Valerie Terrell MRN: [id number] DOB: 11-03-1932 Today's Date: 12/07/2016    History of Present Illness  Valerie Terrell is a 83 y.o. female with medical history significant of left kidney atrophy, retinal artery occlusion in the left eye, stage IV chronic renal insufficiency, history of subarachnoid hemorrhage, hyperlipidemia, hypertension, lumbar spondylosis, paroxysmal atrial fibrillation on apixaban who is coming to the emergency department with complaints of progressively worse dyspnea, lower extremity edema, orthopnea and weakness for the past 2 weeks. Pt with tachy brady and underwent pacer placement on 12/03/16.     PT Comments    Pt de-sat to 88% on RA with gait and 92% on 2 L/min. Con't to recommend HHPT.   Follow Up Recommendations  Home health PT;Supervision - Intermittent     Equipment Recommendations  None recommended by PT    Recommendations for Other Services       Precautions / Restrictions Precautions Precautions: Fall Precaution Comments: pacemaker precautions Restrictions Weight Bearing Restrictions: No Other Position/Activity Restrictions: pacemaker precautions    Mobility  Bed Mobility               General bed mobility comments: sitting EOB upon arrival  Transfers Overall transfer level: Needs assistance Equipment used: Rolling walker (2 wheeled) Transfers: Sit to/from Stand Sit to Stand: Min guard         General transfer comment: min/guard to standing at RW.    Ambulation/Gait Ambulation/Gait assistance: Min guard Ambulation Distance (Feet): 40 Feet Assistive device: Rolling walker (2 wheeled) Gait Pattern/deviations: Step-through pattern;Decreased step length - right;Decreased step length - left;Trunk flexed Gait velocity: decr   General Gait Details: Pt had wanted to attempt ambulation with cane but she realized she was too weak to do this.  AMb with flexed posture and took 1 standing  rest break. o2 93% on RA at rest, 88% with gait on RA and 92% on 2 L/min.   Stairs            Wheelchair Mobility    Modified Rankin (Stroke Patients Only)       Balance Overall balance assessment: Needs assistance Sitting-balance support: No upper extremity supported;Feet supported Sitting balance-Leahy Scale: Fair     Standing balance support: Bilateral upper extremity supported Standing balance-Leahy Scale: Poor Standing balance comment: required UE support                            Cognition Arousal/Alertness: Awake/alert Behavior During Therapy: Flat affect Overall Cognitive Status: Within Functional Limits for tasks assessed                                        Exercises General Exercises - Lower Extremity Ankle Circles/Pumps: AROM;Both;10 reps    General Comments General comments (skin integrity, edema, etc.): Pt educated on ankle pumps for swelling and pursed lip breathing techniques.      Pertinent Vitals/Pain Pain Assessment: No/denies pain    Home Living                      Prior Function            PT Goals (current goals can now be found in the care plan section) Acute Rehab PT Goals Patient Stated Goal: to return home with her daughter's help and with 02 PT Goal Formulation: With patient  Time For Goal Achievement: 12/11/16 Potential to Achieve Goals: Good Progress towards PT goals: Progressing toward goals    Frequency    Min 3X/week      PT Plan Current plan remains appropriate    Co-evaluation              AM-PAC PT "6 Clicks" Daily Activity  Outcome Measure  Difficulty turning over in bed (including adjusting bedclothes, sheets and blankets)?: A Little Difficulty moving from lying on back to sitting on the side of the bed? : A Little Difficulty sitting down on and standing up from a chair with arms (e.g., wheelchair, bedside commode, etc,.)?: A Little Help needed moving to and  from a bed to chair (including a wheelchair)?: A Little Help needed walking in hospital room?: A Little Help needed climbing 3-5 steps with a railing? : A Little 6 Click Score: 18    End of Session Equipment Utilized During Treatment: Gait belt;Oxygen Activity Tolerance: Patient tolerated treatment well Patient left: in bed;with call bell/phone within reach (sitting EOB as she was upon arrival) Nurse Communication: Other (comment) (Spoke with MD re: o2 sats) PT Visit Diagnosis: Unsteadiness on feet (R26.81);Muscle weakness (generalized) (M62.81);Other abnormalities of gait and mobility (R26.89)     Time: 2395-3202 PT Time Calculation (min) (ACUTE ONLY): 24 min  Charges:  $Gait Training: 8-22 mins $Therapeutic Activity: 8-22 mins                    G Codes:       Valerie Terrell, [place] Pager [phone] 12/07/2016    Valerie Terrell 12/07/2016, 2:11 PM

## 2016-12-07 NOTE — Telephone Encounter (Signed)
Patient discharged from hospital today 12/07/2016 Scheduled for new coumadin visit on 12/11/16 at 3pm.  LMOM to confirm appointment

## 2016-12-07 NOTE — Progress Notes (Addendum)
SATURATION QUALIFICATIONS: (This note is used to comply with regulatory documentation for home oxygen)  Patient Saturations on Room Air at Rest  93 %  Patient Saturations on Room Air while Ambulating =79 %  Patient Saturations on 2 Liters of oxygen while Ambulating = 97 %  Please briefly explain why patient needs home oxygen:Lasix tried and failed. Still requiring oxygen.

## 2016-12-07 NOTE — Progress Notes (Signed)
Occupational Therapy Treatment Patient Details Name: Valerie Terrell MRN: [id number] DOB: January 22, 1933 Today's Date: 12/07/2016    History of present illness  Valerie Terrell is a 83 y.o. female with medical history significant of left kidney atrophy, retinal artery occlusion in the left eye, stage IV chronic renal insufficiency, history of subarachnoid hemorrhage, hyperlipidemia, hypertension, lumbar spondylosis, paroxysmal atrial fibrillation on apixaban who is coming to the emergency department with complaints of progressively worse dyspnea, lower extremity edema, orthopnea and weakness for the past 2 weeks. Pt with tachy brady and underwent pacer placement on 12/03/16.    OT comments  Pt seated at EOB upon arrival, reporting having had a bad night and just wants to sleep. Performed toileting and returned to supine. Pt with poor activity tolerance. Daughter reports pt 02 sats dropping even on 3L while sitting EOB earlier today. Pt requiring more assistance for mobility compared to evaluation. Will continue to follow and evaluate d/c disposition.  Follow Up Recommendations  Home health OT    Equipment Recommendations  None recommended by OT    Recommendations for Other Services      Precautions / Restrictions Precautions Precautions: Fall Precaution Comments: pacemaker precautions       Mobility Bed Mobility Overal bed mobility: Needs Assistance Bed Mobility: Sit to Supine           General bed mobility comments: pt seated at EOB upon arrival, assisted LEs back into bed, max assist to reposition in bed  Transfers Overall transfer level: Needs assistance Equipment used: 1 person hand held assist Transfers: Sit to/from Stand;Stand Pivot Transfers Sit to Stand: Min assist Stand pivot transfers: Min assist       General transfer comment: steadying assist    Balance Overall balance assessment: Needs assistance   Sitting balance-Leahy Scale: Fair Sitting balance - Comments:  statically   Standing balance support: Bilateral upper extremity supported Standing balance-Leahy Scale: Poor Standing balance comment: B hand held assist                           ADL either performed or assessed with clinical judgement   ADL Overall ADL's : Needs assistance/impaired     Grooming: Wash/dry hands;Sitting;Set up                   Toilet Transfer: Minimal assistance;Stand-pivot;BSC   Toileting- Clothing Manipulation and Hygiene: Minimal assistance;Sit to/from stand               Vision       Perception     Praxis      Cognition Arousal/Alertness: Lethargic Behavior During Therapy: Flat affect Overall Cognitive Status: Within Functional Limits for tasks assessed                                 General Comments: pt reports not having much sleep last night, daughter at bedside        Exercises     Shoulder Instructions       General Comments      Pertinent Vitals/ Pain       Pain Assessment: No/denies pain  Home Living                                          Prior Functioning/Environment  Frequency  Min 2X/week        Progress Toward Goals  OT Goals(current goals can now be found in the care plan section)  Progress towards OT goals: Not progressing toward goals - comment (pt more fatigued today)  Acute Rehab OT Goals Patient Stated Goal: to return home with her daughter's help and with 02 OT Goal Formulation: With patient Time For Goal Achievement: 12/18/16 Potential to Achieve Goals: Good  Plan Discharge plan remains appropriate (may need to update next visit to SNF)    Co-evaluation                 AM-PAC PT "6 Clicks" Daily Activity     Outcome Measure   Help from another person eating meals?: None Help from another person taking care of personal grooming?: A Little Help from another person toileting, which includes using toliet, bedpan, or  urinal?: A Little Help from another person bathing (including washing, rinsing, drying)?: A Lot Help from another person to put on and taking off regular upper body clothing?: A Little Help from another person to put on and taking off regular lower body clothing?: A Lot 6 Click Score: 17    End of Session Equipment Utilized During Treatment: Gait belt;Oxygen  OT Visit Diagnosis: Unsteadiness on feet (R26.81);Muscle weakness (generalized) (M62.81)   Activity Tolerance Patient limited by fatigue   Patient Left in bed;with call bell/phone within reach;with family/visitor present   Nurse Communication          Time: 8099-8338 OT Time Calculation (min): 13 min  Charges: OT General Charges $OT Visit: 1 Procedure OT Treatments $Self Care/Home Management : 8-22 mins    Malka So 12/07/2016, 8:58 AM  [phone]

## 2016-12-07 NOTE — Telephone Encounter (Signed)
-----   Message from Erskine Emery, [facility] sent at 12/07/2016  4:44 PM EDT ----- Regarding: FW: coumadin clinic- results coming from [place] on [address],  This patient was forwarded to [place]?? But I believe she ultimately will be following with you all. Would you mind setting her up in your clinic. Please and thank you!  Georgina Peer    ----- Message ----- From: Candis Schatz Sent: 12/07/2016   2:57 PM To: Leeroy Bock, RPH Subject: FW: coumadin clinic- results coming from [facility]#    ----- Message ----- From: Murlean Iba, MD Sent: 12/07/2016   2:31 PM To: Candis Schatz Subject: coumadin clinic                                Please set this patient of Dr. Kennon Holter up in the coumadin clinic.  Pt being discharged from hospital 12/07/16.  Thank you.

## 2016-12-07 NOTE — Care Management Important Message (Signed)
Important Message  Patient Details  Name: Valerie Terrell MRN: [id number] Date of Birth: 29-Nov-1932   Medicare Important Message Given:  Yes    Ahad Colarusso Abena 12/07/2016, 9:09 AM

## 2016-12-07 NOTE — Progress Notes (Signed)
Progress Note  Patient Name: Valerie Terrell Date of Encounter: 12/07/2016  Primary Cardiologist: Dr Gwenlyn Found Primary Electrophysiologist: Dr Lovena Le  Subjective   Looks frail- on O2, says SOB is better. She was told she was going home and is happy about this- even if she has to go on O2.   Inpatient Medications    Scheduled Meds: . amiodarone  200 mg Oral BID  . carvedilol  3.125 mg Oral BID WC  . feeding supplement (ENSURE ENLIVE)  237 mL Oral BID BM  . furosemide  160 mg Oral BID  . hydrALAZINE  100 mg Oral TID  . mouth rinse  15 mL Mouth Rinse BID  . patient's guide to using coumadin book   Does not apply Once  . potassium chloride  20 mEq Oral Daily  . pravastatin  40 mg Oral QPM  . vitamin C  500 mg Oral Daily  . Warfarin - Pharmacist Dosing Inpatient   Does not apply q1800   Continuous Infusions:  PRN Meds: acetaminophen, morphine injection, ondansetron (ZOFRAN) IV, ondansetron, oxyCODONE-acetaminophen, phenol, senna   Vital Signs    Vitals:   12/06/16 1506 12/06/16 1957 12/07/16 0500 12/07/16 0953  BP: (!) 163/63 (!) 156/83 (!) 161/71 (!) 162/68  Pulse: 69 67 68 74  Resp: 18     Temp: 98.9 F (37.2 C) 98.4 F (36.9 C) 98.7 F (37.1 C)   TempSrc: Oral Oral Oral   SpO2: 94% 100% 92%   Weight:   110 lb 8 oz (50.1 kg)   Height:        Intake/Output Summary (Last 24 hours) at 12/07/16 1019 Last data filed at 12/07/16 3557  Gross per 24 hour  Intake              240 ml  Output              200 ml  Net               40 ml   Filed Weights   12/05/16 0606 12/06/16 0413 12/07/16 0500  Weight: 112 lb 3.2 oz (50.9 kg) 111 lb (50.3 kg) 110 lb 8 oz (50.1 kg)    Telemetry    NSR, POD - Personally Reviewed  ECG    AF on admission - Personally Reviewed  Physical Exam   GEN: Frail, pale, sitting on side of the bed Cardiac: RRR, no murmurs, rubs, or gallops.  Pacer site:  Dressing dry, no hematoma Respiratory: Lt base end insp rub Extrem: 1+ edema.  Compression stockings in place Neuro:  Nonfocal  Psych: Normal affect   Labs    Chemistry Recent Labs Lab 12/04/16 1417 12/05/16 0227 12/06/16 1112 12/07/16 0332  NA  --  134* 135 133*  K  --  4.9 4.6 5.4*  CL  --  97* 96* 96*  CO2  --  26 28 23   GLUCOSE  --  122* 112* 91  BUN  --  90* 86* 86*  CREATININE  --  3.09* 2.84* 2.84*  CALCIUM  --  8.6* 8.8* 8.9  ALBUMIN 2.8*  --   --   --   GFRNONAA  --  13* 15* 15*  GFRAA  --  15* 17* 17*  ANIONGAP  --  11 11 14      Hematology Recent Labs Lab 12/02/16 0305 12/03/16 0155 12/06/16 1112  WBC 7.1 9.3 8.4  RBC 3.54* 3.63* 3.33*  HGB 10.7* 10.5* 9.7*  HCT 32.5* 33.4* 30.6*  MCV 91.8 92.0 91.9  MCH 30.2 28.9 29.1  MCHC 32.9 31.4 31.7  RDW 13.8 13.6 14.0  PLT 247 285 270    Cardiac Enzymes Recent Labs Lab 11/30/16 1331 11/30/16 2033 12/01/16 0229  TROPONINI 0.11* 0.09* 0.09*   No results for input(s): TROPIPOC in the last 168 hours.   BNP Recent Labs Lab 11/30/16 1331  BNP >4,500.0*     DDimer No results for input(s): DDIMER in the last 168 hours.   Radiology    CXR 12/04/16-  IMPRESSION: Left subclavian pacemaker device placement without complication or pneumothorax.  Stable right basilar pulmonary opacities  Stable left pleural effusion and basilar atelectasis for scarring.   Cardiac Studies   Echo 12/02/16- Impressions:  - When compared to the prior study from 07/14/2016 LVEF has   decreased from 60-65% to 35-40%. RV is moderately dilated with   moderately decreased RVEF. There is RV strain. Consider workup   for pulmonary embolism.   The study acquired while the patient in atrial fibrillation with   RVR.  Patient Profile     83 y.o. female followed by Dr Gwenlyn Found with a hx of atrophic left kidney, hx of subarachnoid hemorrhage, HTN,  CKD III, hyperlipidemia, PAF on low dose Eliquis, and past CHF admitted 11/30/16 with CHF and AF with post termination pauses.  Assessment & Plan    1.  Acute on Chronic diastolic HF: - BNP >8177 this admission. I/O negative 1.9L and wgt down 4lbs since adm. She still appears to be a little volume overloaded on exam to me.   2. Elevated Troponin: - Mild flat Trop trend, suspect 2/2 demand ischemia in the setting of HF and CKD.  Myoview low risk Sept 2017  3. PAF with tachybrady syndrome - PTVDP-St Jude- implanted 12/03/16, Amiodarone started.   4. Acute on CKD III: - Seen by Dr Joelyn Oms with nephrology this admission. She is on high dose PO Lasix.    5. HTN: - Remains hypertensive. Continue hydralazine 100mg  TID. Amlodipine stopped this admission secondary to lower extremity edema (which she still has). Coreg was added, we can probably increase this for HTN now that she has a pacemaker.   6. Anticoagulation: - Eliquis stopped this admission secondary to renal function, Coumadin initiated.   Plan: Will discuss with MD. The pt wants to go home. She'll need an early OV- high risk for re admission.     Angelena Form, PA-C  12/07/2016, 10:19 AM     Patient seen and examined. Agree with assessment and plan. Frail.  Converted to sinus rhythm with amiodarone; rate in the 70s. Will f/u BNP.  Dc KCL with K 5.4; Cr 2.84 today.  BP remains elevated; will increase carvedilol to 6.25 mg bid today.  If BP still elevated consider re-instituting amlodipine.   Troy Sine, MD, [facility] 12/07/2016 11:10 AM

## 2016-12-07 NOTE — Progress Notes (Signed)
Admit: 11/30/2016 LOS: 7  Patient is an 83 yo F with a pmhx of HTN, HLD, paroxsymal atrial fibrillation previously on Eliquis, atrophic left kidney, and CKD IV admitted 7/16 for acute on chronic CHF exacerbation and renal dysfunction.   Subjective:  Doing well this morning, sitting up eating breakfast. Persistent oxygen requirement, stable on [place]. Reports frequent urination with lasix.   07/22 0701 - 07/23 0700 In: -  Out: 200 [Urine:200]  Filed Weights   12/05/16 0606 12/06/16 0413 12/07/16 0500  Weight: 112 lb 3.2 oz (50.9 kg) 111 lb (50.3 kg) 110 lb 8 oz (50.1 kg)    Scheduled Meds: . amiodarone  200 mg Oral BID  . carvedilol  3.125 mg Oral BID WC  . feeding supplement (ENSURE ENLIVE)  237 mL Oral BID BM  . furosemide  160 mg Oral BID  . hydrALAZINE  100 mg Oral TID  . mouth rinse  15 mL Mouth Rinse BID  . patient's guide to using coumadin book   Does not apply Once  . potassium chloride  20 mEq Oral Daily  . pravastatin  40 mg Oral QPM  . vitamin C  500 mg Oral Daily  . Warfarin - Pharmacist Dosing Inpatient   Does not apply q1800   Continuous Infusions: PRN Meds:.acetaminophen, morphine injection, ondansetron (ZOFRAN) IV, ondansetron, oxyCODONE-acetaminophen, phenol, senna  Current Labs: reviewed    Physical Exam:  Blood pressure (!) 161/71, pulse 68, temperature 98.7 F (37.1 C), temperature source Oral, resp. rate 18, height 4\' 11"  (1.499 m), weight 110 lb 8 oz (50.1 kg), SpO2 92 %. GEN: Thin, elderly appearing female in NAD CV: RRR PULM: Poor effort but CTAB ABD: Soft, non tender, non distended, normal BS SKIN: No rash  EXT: Improving 1-2+ pitting edema of the LE bilaterally   A 1. CKD IV: Stable GFR, SCr near baseline. Potassium up today but sample was hemolyzed.   2. Hypervolemia secondary to CHF exacerbation: Diuresing well with lasix. Swelling improved, respiratory status stable on [place].  3. Afib / Tachybrady syndrome: S/p pacemaker implantation this  hospitalization. Home Eliquis DC'd, now on warfarin. Amiodarone started per cards.  4. HTN: Currently on Coreg, hydralazine, and lasix. Atenolol DC'd and Coreg started per cards. Home amlodipine DC'd due to persistent lower extremity swelling. Holding home spironolactone.   P 1. Nephrology will sign off, [place] for discharge from our standpoint. Plan for close follow up with Dr. Florene Glen, already scheduled for August. please re consult with any issues.  2. Continue PO lasix 160 mg BID. Daily labs, strict I/Os, daily weights. Avoid nephrotoxic meds.  3. Other issues per primary and cards.   [name], M.D. - PGY2 Pager: [phone] 12/07/2016, 11:07 AM    Recent Labs Lab 12/05/16 0227 12/06/16 1112 12/07/16 0332  NA 134* 135 133*  K 4.9 4.6 5.4*  CL 97* 96* 96*  CO2 26 28 23   GLUCOSE 122* 112* 91  BUN 90* 86* 86*  CREATININE 3.09* 2.84* 2.84*  CALCIUM 8.6* 8.8* 8.9    Recent Labs Lab 12/02/16 0305 12/03/16 0155 12/06/16 1112  WBC 7.1 9.3 8.4  HGB 10.7* 10.5* 9.7*  HCT 32.5* 33.4* 30.6*  MCV 91.8 92.0 91.9  PLT 247 285 270

## 2016-12-07 NOTE — Progress Notes (Signed)
SATURATION QUALIFICATIONS: (This note is used to comply with regulatory documentation for home oxygen)  Patient Saturations on Room Air at Rest = 93%  Patient Saturations on Room Air while Ambulating = 88%  Patient Saturations on 2 Liters of oxygen while Ambulating = 92%  Please briefly explain why patient needs home oxygen: Pt de-sat to 88% on room air with short distance gait.

## 2016-12-07 NOTE — Progress Notes (Signed)
Pt awoke this am disoriented per NT, upon nursing assessment, pt alert and oriented x 4, pt states "I'm not crazy" no neuro deficits per exam.  Pt did have percocet at bedtime for c/o headache and backache.  Pt called dtr to come to bedside from home.  On the daughter's arrival, daughter voiced concern of the pt having TIA's in the past when she has been confused in the hospital setting.  [facility] NP notified with no further orders.  Will continue to monitor pt closely.

## 2016-12-07 NOTE — Consult Note (Signed)
   [facility] CM Inpatient Consult   12/07/2016  Valerie Terrell 08/10/1932 [id number]    [facility] Care Management follow up.   Went to bedside to speak with Mrs. Valerie Terrell about [facility] Care Management program. Explained services and she is agreeable. Written consent obtained. [facility] Care Management packet with contact information provided. Explained that [facility] will not interfere or replace services provided by home health. States she will have [facility] as well.  She lives with daughter- Valerie Terrell. Her daughter provides transportation to MD appointments. Mrs. Valerie Terrell endorses she receives her medication from mail delivery without problems. Confirmed Primary Care MD is Dr. Anitra Lauth.  Discussed [facility] Care Management follow up wiill be for CHF disease and symptom management. She is agreeable to this.   Valerie Terrell is slated to discharge today. Will make referral to [place].   Marthenia Rolling, MSN-Ed, RN,BSN [facility] Liaison [phone]

## 2016-12-08 ENCOUNTER — Other Ambulatory Visit: Payer: Self-pay

## 2016-12-08 ENCOUNTER — Telehealth: Payer: Self-pay

## 2016-12-08 DIAGNOSIS — E785 Hyperlipidemia, unspecified: Secondary | ICD-10-CM | POA: Diagnosis not present

## 2016-12-08 DIAGNOSIS — N183 Chronic kidney disease, stage 3 (moderate): Secondary | ICD-10-CM | POA: Diagnosis not present

## 2016-12-08 DIAGNOSIS — Z950 Presence of cardiac pacemaker: Secondary | ICD-10-CM | POA: Diagnosis not present

## 2016-12-08 DIAGNOSIS — I5042 Chronic combined systolic (congestive) and diastolic (congestive) heart failure: Secondary | ICD-10-CM | POA: Diagnosis not present

## 2016-12-08 DIAGNOSIS — I255 Ischemic cardiomyopathy: Secondary | ICD-10-CM | POA: Diagnosis not present

## 2016-12-08 DIAGNOSIS — I739 Peripheral vascular disease, unspecified: Secondary | ICD-10-CM | POA: Diagnosis not present

## 2016-12-08 DIAGNOSIS — D638 Anemia in other chronic diseases classified elsewhere: Secondary | ICD-10-CM | POA: Diagnosis not present

## 2016-12-08 DIAGNOSIS — I130 Hypertensive heart and chronic kidney disease with heart failure and stage 1 through stage 4 chronic kidney disease, or unspecified chronic kidney disease: Secondary | ICD-10-CM | POA: Diagnosis not present

## 2016-12-08 DIAGNOSIS — Z79891 Long term (current) use of opiate analgesic: Secondary | ICD-10-CM | POA: Diagnosis not present

## 2016-12-08 DIAGNOSIS — Z5181 Encounter for therapeutic drug level monitoring: Secondary | ICD-10-CM | POA: Diagnosis not present

## 2016-12-08 DIAGNOSIS — Z9981 Dependence on supplemental oxygen: Secondary | ICD-10-CM | POA: Diagnosis not present

## 2016-12-08 NOTE — Patient Outreach (Signed)
[facility]) Care Management  12/08/2016  Valerie Terrell 10/17/1932 [id number]  Subjective: client reports she is taking medications and weighing self daily  Objective: none  Assessment: 83 year old with recent admission 7/16-7/23 with respiratory failure due to pulmonary edema due to volume overload from acute kidney failure; Pacer placed 7/19 due to tachy brady syndrome.   Client confirms advanced home care has started care. No issues or concerns expressed at this time.  Plan: home visit next week.    Thea Silversmith, RN, MSN, [place] Coordinator Cell: [phone]

## 2016-12-08 NOTE — Telephone Encounter (Signed)
Transition Care Management Follow-up Telephone Call   Date discharged? 12/07/16   How have you been since you were released from the hospital? "Much better than I felt while in the hospital"   Do you understand why you were in the hospital? yes   Do you understand the discharge instructions? yes   Where were you discharged to? Home. Daughter with patient.    Items Reviewed:  Medications reviewed: no, "we can do that tomorrow"  Allergies reviewed: no  Dietary changes reviewed: no changes  Referrals reviewed: f/u with cardiology   Functional Questionnaire:   Activities of Daily Living (ADLs):   She states they are independent in the following: None States they require assistance with the following: Assistance from daughter with ADL's.    Any transportation issues/concerns?: no   Any patient concerns? no   Confirmed importance and date/time of follow-up visits scheduled yes  Provider Appointment booked with PCP 12/09/16 @ 11:15.   Confirmed with patient if condition begins to worsen call PCP or go to the ER.  Patient was given the office number and encouraged to call back with question or concerns.  : yes

## 2016-12-08 NOTE — Telephone Encounter (Signed)
Noted: TCM phone contact made with pt today.

## 2016-12-09 ENCOUNTER — Ambulatory Visit (INDEPENDENT_AMBULATORY_CARE_PROVIDER_SITE_OTHER): Payer: Medicare Other | Admitting: Family Medicine

## 2016-12-09 ENCOUNTER — Encounter: Payer: Self-pay | Admitting: Family Medicine

## 2016-12-09 VITALS — BP 160/62 | HR 63 | Temp 97.9°F | Resp 16 | Ht 59.0 in | Wt 111.5 lb

## 2016-12-09 DIAGNOSIS — I482 Chronic atrial fibrillation, unspecified: Secondary | ICD-10-CM

## 2016-12-09 DIAGNOSIS — N184 Chronic kidney disease, stage 4 (severe): Secondary | ICD-10-CM | POA: Diagnosis not present

## 2016-12-09 DIAGNOSIS — D631 Anemia in chronic kidney disease: Secondary | ICD-10-CM

## 2016-12-09 DIAGNOSIS — I495 Sick sinus syndrome: Secondary | ICD-10-CM | POA: Diagnosis not present

## 2016-12-09 DIAGNOSIS — Z7901 Long term (current) use of anticoagulants: Secondary | ICD-10-CM | POA: Diagnosis not present

## 2016-12-09 DIAGNOSIS — I428 Other cardiomyopathies: Secondary | ICD-10-CM | POA: Diagnosis not present

## 2016-12-09 DIAGNOSIS — J9621 Acute and chronic respiratory failure with hypoxia: Secondary | ICD-10-CM

## 2016-12-09 LAB — PROTEIN ELECTROPHORESIS, SERUM
A/G RATIO SPE: 0.9
ALBUMIN ELP: 2.8 g/dL
ALPHA-2-GLOBULIN: 1 g/dL
Alpha-1-Globulin: 0.4 g/dL
BETA GLOBULIN: 0.8 g/dL
Gamma Globulin: 0.8 g/dL
Globulin, Total: 3 g/dL
Total Protein ELP: 5.8 g/dL — ABNORMAL LOW (ref 6.0–8.5)

## 2016-12-09 LAB — CBC WITH DIFFERENTIAL/PLATELET
BASOS ABS: 0 10*3/uL (ref 0.0–0.1)
Basophils Relative: 0.3 % (ref 0.0–3.0)
EOS PCT: 0.4 % (ref 0.0–5.0)
Eosinophils Absolute: 0 10*3/uL (ref 0.0–0.7)
HCT: 32.6 % — ABNORMAL LOW (ref 36.0–46.0)
Hemoglobin: 10.6 g/dL — ABNORMAL LOW (ref 12.0–15.0)
LYMPHS ABS: 0.4 10*3/uL — AB (ref 0.7–4.0)
Lymphocytes Relative: 3.3 % — ABNORMAL LOW (ref 12.0–46.0)
MCHC: 32.6 g/dL (ref 30.0–36.0)
MCV: 92.9 fl (ref 78.0–100.0)
MONO ABS: 1.3 10*3/uL — AB (ref 0.1–1.0)
Monocytes Relative: 12.4 % — ABNORMAL HIGH (ref 3.0–12.0)
NEUTROS ABS: 9 10*3/uL — AB (ref 1.4–7.7)
NEUTROS PCT: 83.6 % — AB (ref 43.0–77.0)
PLATELETS: 257 10*3/uL (ref 150.0–400.0)
RBC: 3.51 Mil/uL — AB (ref 3.87–5.11)
RDW: 13.9 % (ref 11.5–15.5)
WBC: 10.7 10*3/uL — ABNORMAL HIGH (ref 4.0–10.5)

## 2016-12-09 LAB — BASIC METABOLIC PANEL
BUN: 78 mg/dL — ABNORMAL HIGH (ref 6–23)
CALCIUM: 9.2 mg/dL (ref 8.4–10.5)
CO2: 34 meq/L — AB (ref 19–32)
CREATININE: 2.54 mg/dL — AB (ref 0.40–1.20)
Chloride: 90 mEq/L — ABNORMAL LOW (ref 96–112)
GFR: 19.25 mL/min — ABNORMAL LOW (ref >60.00)
Glucose, Bld: 105 mg/dL — ABNORMAL HIGH (ref 70–99)
Potassium: 3.6 mEq/L (ref 3.5–5.1)
SODIUM: 133 meq/L — AB (ref 135–145)

## 2016-12-09 LAB — KAPPA/LAMBDA LIGHT CHAINS
KAPPA, LAMDA LIGHT CHAIN RATIO: 1.31 (ref 0.26–1.65)
Kappa free light chain: 70.4 mg/L — ABNORMAL HIGH (ref 3.3–19.4)
Lambda free light chains: 53.6 mg/L — ABNORMAL HIGH (ref 5.7–26.3)

## 2016-12-09 NOTE — Progress Notes (Signed)
12/09/2016  CC:  Chief Complaint  Patient presents with  . Hospitalization Follow-up    TCM    Patient is a 83 y.o. Caucasian female who presents accompanied by her daughter for hospital follow up, specifically [facility] face-to-face visit. Dates hospitalized: 11/30/16-12/07/16. Days since d/c from hospital: 2 days Patient was discharged from hospital to home. Reason for admission to hospital: SOB--pulm edema. Date of interactive (phone) contact with patient and/or caregiver: 12/08/16  I have reviewed patient's discharge summary plus pertinent specific notes, labs, and imaging from the hospitalization.   She had repeat echo which showed EF 35-40%.  Diuretic dose increased. Tachy-brady syndrome in hosp: pacemaker was placed 12/03/16. Pt was started on amiodarone in hosp.  Her anticoag was changed from eliquis to coumadin.  Has appt w/electro phys and to get PT/INR in 2 days.  Next week has cardiologist o/v at end of week. Wearing oxygen 3 L 24/7.  Feeling better: only SOB with walking and her sats drop into 80s.  No palpitations. Home bp's normal.  HR's <100. She says her home scale has shown she gained 1 lb compared to her last wt taken on hospital scale. No fevers, no rashes.  The left upper arm rash she had when I last saw her has cleared up considerably with use of cutivate.  Medication reconciliation was done today and patient is taking meds as recommended by discharging hospitalist/specialist.    PMH:  Past Medical History:  Diagnosis Date  . Atrophy of left kidney    with absent blood flow by renal artery dopplers (Dr. Gwenlyn Found)  . 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 Chronic renal insufficiency, stage 4 (severe) (HCC)    Baseline GFR 25 ml/min-- renal u/s showed atrophic/hypoplastic left kidney.  No hydronephrosis.  Small right renal cyst.  . History of adenomatous polyp of colon   . History of subarachnoid hemorrhage 10/2014   after syncope and  while on xarelto  . Hyperlipidemia   . Hypertension    Difficult to control, in the setting of one functioning kidney: pt was referred to nephrology by Dr. Gwenlyn Found 06/2015.  . Lumbar radiculopathy 2012  . Lumbar spondylosis    MR 07/2016---no sign of spinal nerve compression or cord compression.  Pt set up with outpt ortho while admitted to hosp 07/2016.  . Metatarsal fracture 06/10/2016   Nondisplaced, left 5th metatarsal--pt was referred to ortho  . Nonischemic cardiomyopathy ([place])   . Osteopenia 2014   T-score -2.1  . PAF (paroxysmal atrial fibrillation) (HCC)    Eliquis started after BRAO and CVA  . Pulmonary edema 11/2016  . Stroke [facility])    cardioembolic (had CVA while on no anticoag)--"scattered subacute punctate infarcts: 1 in R parietal lobe and 2 in occipital cortex" on MRI br.  CT angio head/neck: aortic arch athero.   . Thoracic compression fracture ([place]) 07/2016   T7 and T8-- T8 kyphoplasty during hosp admission 07/2016.  Neuro referred pt to pain mgmt for consideration of injection 09/2016.  I referred her to endo 08/2016 for consideration of calcitonin treatment.    PSH:  Past Surgical History:  Procedure Laterality Date  . APPENDECTOMY  child  . CARDIOVASCULAR STRESS TEST  01/31/2016   Stress myoview: NORMAL/Low risk.  EF 56%.  . [place]  . COLONOSCOPY  2015   + hx of adenomatous polyps.  Need digest health spec in [place] records to see when pt due for next colonoscopy  .  IR GENERIC HISTORICAL  07/24/2016   IR KYPHO THORACIC WITH BONE BIOPSY 07/24/2016 Luanne Bras, MD [facility]  . KYPHOPLASTY  07/27/2016   T8  . PACEMAKER IMPLANT N/A 12/03/2016   Procedure: Pacemaker Implant;  Surgeon: Evans Lance, MD;  Location: [facility];  Service: Cardiovascular;  Laterality: N/A;  . Renal artery dopplers  02/26/2016   Her right renal dimension was 11 cm pole to pole with mild to moderate right renal artery stenosis. A right renal aortic ratio was 3.22  suggesting less than a 50% stenosis.  . TONSILLECTOMY    . TRANSTHORACIC ECHOCARDIOGRAM  01/29/2016; 11/2016   2017: EF 55-60%, normal LV wall motion, grade I DD.  No cardiac source of emboli was seen.  2018: EF 35-40%, could not assess DD due to a-fib, pulm HTN noted.    MEDS:  Outpatient Medications Prior to Visit  Medication Sig Dispense Refill  . acetaminophen (TYLENOL) 500 MG tablet Take 500 mg by mouth every 6 (six) hours as needed for moderate pain.    [name] amiodarone (PACERONE) 200 MG tablet Take 1 tablet (200 mg total) by mouth 2 (two) times daily. 60 tablet 0  . Calcium Carb-Cholecalciferol (CALCIUM/VITAMIN D PO) Take 1 tablet by mouth 2 (two) times daily.    . carvedilol (COREG) 6.25 MG tablet Take 1 tablet (6.25 mg total) by mouth 2 (two) times daily with a meal. 60 tablet 0  . feeding supplement, ENSURE ENLIVE, (ENSURE ENLIVE) LIQD Take 237 mLs by mouth 2 (two) times daily between meals. 237 mL 12  . fluticasone (CUTIVATE) 0.05 % cream Apply topically 2 (two) times daily. 30 g 1  . furosemide (LASIX) 80 MG tablet Take 2 tablets (160 mg total) by mouth 2 (two) times daily. 120 tablet 0  . hydrALAZINE (APRESOLINE) 100 MG tablet Take 1 tablet (100 mg total) by mouth 3 (three) times daily. 30 tablet 0  . ibandronate (BONIVA) 150 MG tablet Take 1 tablet (150 mg total) by mouth every 30 (thirty) days. Take in the morning with a full glass of water, on an empty stomach, and do not take anything else by mouth or lie down for the next 30 min. 3 tablet 3  . Multiple Vitamin (MULTIVITAMIN) tablet Take 1 tablet by mouth 3 (three) times a week.     . ondansetron (ZOFRAN) 4 MG tablet Take 0.5-1 tablets (2-4 mg total) by mouth every 8 (eight) hours as needed for nausea or vomiting. 30 tablet 3  . oxyCODONE-acetaminophen (PERCOCET/ROXICET) 5-325 MG tablet Take 1-2 tablets by mouth every 6 (six) hours as needed for severe pain. 60 tablet 0  . pravastatin (PRAVACHOL) 40 MG tablet Take 40 mg by mouth  every evening.    . vitamin C (ASCORBIC ACID) 500 MG tablet Take 500 mg by mouth daily.    . Vitamin D, Ergocalciferol, (DRISDOL) 50000 units CAPS capsule 1 cap po q week x 12 weeks (Patient taking differently: Take 50,000 Units by mouth every 7 (seven) days. For 12 weeks. (Thursdays)) 12 capsule 1  . warfarin (COUMADIN) 3 MG tablet Take 1 tablet (3 mg total) by mouth daily. 10 tablet 0   No facility-administered medications prior to visit.    EXAM: BP (!) 160/62 (BP Location: Right Arm, Patient Position: Sitting, Cuff Size: Small)   Pulse 63   Temp 97.9 F (36.6 C) (Oral)   Resp 16   Ht 4\' 11"  (1.499 m)   Wt 111 lb 8 oz (50.6 kg)  SpO2 92% Comment: 3L O2  BMI 22.52 kg/m   Gen: alert, sitting in WC in NAD.   AFFECT: pleasant, lucid thought and speech. PRF:FMBW: no injection, icteris, swelling, or exudate.  EOMI, PERRLA. Mouth: lips without lesion/swelling.  Oral mucosa pink and moist. Oropharynx without erythema, exudate, or swelling.  Neck: no JVD. CV: RRR (rate 46-65), soft systolic murmur, no diastolic murmur, no rub/gallop Chest is clear, no wheezing or rales. Normal symmetric air entry throughout both lung fields. No chest wall deformities or tenderness. ABD: soft, NT/ND EXT: 2-3 + pitting edema in both LL's.  She is wearing compression hose today.  Pertinent labs/imaging   Chemistry      Component Value Date/Time   NA 133 (L) 12/07/2016 0332   NA 137 09/01/2016   K 5.4 (H) 12/07/2016 0332   CL 96 (L) 12/07/2016 0332   CO2 23 12/07/2016 0332   BUN 86 (H) 12/07/2016 0332   BUN 51 (A) 09/01/2016   CREATININE 2.84 (H) 12/07/2016 0332   CREATININE 1.63 (H) 03/24/2016 0110   GLU 128 09/01/2016      Component Value Date/Time   CALCIUM 8.9 12/07/2016 0332   ALKPHOS 55 09/24/2016 1434   AST 26 09/24/2016 1434   ALT 16 09/24/2016 1434   BILITOT 0.3 09/24/2016 1434   BILITOT 0.2 06/17/2016 0819     Lab Results  Component Value Date   WBC 8.4 12/06/2016   HGB 9.7 (L)  12/06/2016   HCT 30.6 (L) 12/06/2016   MCV 91.9 12/06/2016   PLT 270 12/06/2016   Lab Results  Component Value Date   TSH 1.528 12/03/2016   Lab Results  Component Value Date   INR 2.44 12/07/2016   INR 1.62 12/06/2016   INR 1.13 12/05/2016    ASSESSMENT/PLAN:  1) Acute on chronic hypoxic resp failure: secondary to pulm edema. Much improved.  Lasix dose is now 160 mg bid daily.  No longer on aldactone. On home oxygen 24/7: 3 L nasal cannulae. Check lytes/cr today.  2) NICM: EF 35-40%.  She was changed to coreg 6.25mg  bid.  Lasix as above.  3) Tachy-brady syndrome: pacemaker placed in hospital, has f/u in electrophys clinic in 2d. She is now on amiodarone and coumadin.  4) Chronic a-fib: previously was on rate control with atenolol and anticoagulated with eliquis. Now on amiodarone and coumadin.  INR therapeutic on day of hosp d/c.  Plan in place to recheck INR in 2d with cardiology.  Continue coumadin 3mg  qd.  5) CRI, stage 4, with worsening of GFR short term in hosp recently.  Recheck BMET today.  6) Anemia of CRI: recheck CBC today.  An After Visit Summary was printed and given to the patient.  FOLLOW UP:  1 mo  Signed:  Crissie Sickles, MD           12/09/2016

## 2016-12-10 ENCOUNTER — Other Ambulatory Visit: Payer: Self-pay | Admitting: *Deleted

## 2016-12-10 DIAGNOSIS — I255 Ischemic cardiomyopathy: Secondary | ICD-10-CM | POA: Diagnosis not present

## 2016-12-10 DIAGNOSIS — I739 Peripheral vascular disease, unspecified: Secondary | ICD-10-CM | POA: Diagnosis not present

## 2016-12-10 DIAGNOSIS — E785 Hyperlipidemia, unspecified: Secondary | ICD-10-CM | POA: Diagnosis not present

## 2016-12-10 DIAGNOSIS — Z9981 Dependence on supplemental oxygen: Secondary | ICD-10-CM | POA: Diagnosis not present

## 2016-12-10 DIAGNOSIS — Z5181 Encounter for therapeutic drug level monitoring: Secondary | ICD-10-CM | POA: Diagnosis not present

## 2016-12-10 DIAGNOSIS — I5042 Chronic combined systolic (congestive) and diastolic (congestive) heart failure: Secondary | ICD-10-CM | POA: Diagnosis not present

## 2016-12-10 DIAGNOSIS — N183 Chronic kidney disease, stage 3 (moderate): Secondary | ICD-10-CM | POA: Diagnosis not present

## 2016-12-10 DIAGNOSIS — Z79891 Long term (current) use of opiate analgesic: Secondary | ICD-10-CM | POA: Diagnosis not present

## 2016-12-10 DIAGNOSIS — D638 Anemia in other chronic diseases classified elsewhere: Secondary | ICD-10-CM | POA: Diagnosis not present

## 2016-12-10 DIAGNOSIS — Z950 Presence of cardiac pacemaker: Secondary | ICD-10-CM | POA: Diagnosis not present

## 2016-12-10 DIAGNOSIS — I130 Hypertensive heart and chronic kidney disease with heart failure and stage 1 through stage 4 chronic kidney disease, or unspecified chronic kidney disease: Secondary | ICD-10-CM | POA: Diagnosis not present

## 2016-12-10 MED ORDER — ONDANSETRON HCL 4 MG PO TABS: 2.0000 mg | ORAL_TABLET | Freq: Three times a day (TID) | ORAL | 3 refills | Status: DC | PRN

## 2016-12-10 NOTE — Telephone Encounter (Signed)
Pt requesting refill for Zofran be sent to [facility]. Please advise.Thanks.

## 2016-12-10 NOTE — Telephone Encounter (Signed)
Left message stating that Rx has been sent to [facility].

## 2016-12-11 ENCOUNTER — Ambulatory Visit (INDEPENDENT_AMBULATORY_CARE_PROVIDER_SITE_OTHER): Payer: Medicare Other | Admitting: Pharmacist

## 2016-12-11 ENCOUNTER — Telehealth: Payer: Self-pay | Admitting: Cardiovascular Disease

## 2016-12-11 DIAGNOSIS — I4891 Unspecified atrial fibrillation: Secondary | ICD-10-CM

## 2016-12-11 DIAGNOSIS — Z7901 Long term (current) use of anticoagulants: Secondary | ICD-10-CM | POA: Insufficient documentation

## 2016-12-11 LAB — POCT INR: INR: >8

## 2016-12-11 LAB — PROTIME-INR
INR: 6.9 — ABNORMAL HIGH
Prothrombin Time: 72.2 s — ABNORMAL HIGH (ref 9.0–11.5)

## 2016-12-11 NOTE — Telephone Encounter (Signed)
Received STAT page regarding patient's INR value from today, which resulted at 6.9. Spoke with patient by phone. She has been taking 3mg  coumadin nightly as directed since her prior discharge. No other new meds. No changes to diet. No signs/symptoms of bleeding/bruising. Had not yet taken coumadin tonight. Instructed patient not to take coumadin tonight or tomorrow night. Has someone coming to her house to check INR again on Sunday. Will have this rechecked at this time and will take coumadin 3mg  Sunday night if INR value results < 3.0. Will then wait to hear back from someone from cardiology office or from pharmacy (both cc'd) regarding new coumadin dose. Patient also understands that she should go to ED if she has any falls or signs of bleeding.  Lanna Poche, MD Cardiology fellow

## 2016-12-11 NOTE — Patient Instructions (Signed)
Vitamin K Foods and Warfarin Warfarin is a blood thinner (anticoagulant). Anticoagulant medicines help prevent the formation of blood clots. These medicines work by decreasing the activity of vitamin K, which promotes normal blood clotting. When you take warfarin, problems can occur from suddenly increasing or decreasing the amount of vitamin K that you eat from one day to the next. Problems may include:  Blood clots.  Bleeding. What general guidelines do I need to follow? To avoid problems when taking warfarin:  Eat a balanced diet that includes:  Fresh fruits and vegetables.  Whole grains.  Low-fat dairy products.  Lean proteins, such as fish, eggs, and lean cuts of meat.  Keep your intake of vitamin K consistent from day to day. To do this:  Avoid eating large amounts of vitamin K one day and low amounts of vitamin K the next day.  If you take a multivitamin that contains vitamin K, be sure to take it every day.  Know which foods contain vitamin K. Use the lists below to understand serving sizes and the amount of vitamin K in one serving.  Avoid major changes in your diet. If you are going to change your diet, talk with your health care provider before making changes.  Work with a nutrition specialist (dietitian) to develop a meal plan that works best for you. High vitamin K foods Foods that are high in vitamin K contain more than 100 mcg (micrograms) per serving. These include:  Broccoli (cooked) -  cup has 110 mcg.  Brussels sprouts (cooked) -  cup has 109 mcg.  Greens, beet (cooked) -  cup has 350 mcg.  Greens, collard (cooked) -  cup has 418 mcg.  Greens, turnip (cooked) -  cup has 265 mcg.  Green onions or scallions -  cup has 105 mcg.  Kale (fresh or frozen) -  cup has 531 mcg.  Parsley (raw) - 10 sprigs has 164 mcg.  Spinach (cooked) -  cup has 444 mcg.  Swiss chard (cooked) -  cup has 287 mcg. Moderate vitamin K foods Foods that have a  moderate amount of vitamin K contain 25-100 mcg per serving. These include:  Asparagus (cooked) - 5 spears have 38 mcg.  Black-eyed peas (dried) -  cup has 32 mcg.  Cabbage (cooked) -  cup has 37 mcg.  Kiwi fruit - 1 medium has 31 mcg.  Lettuce - 1 cup has 57-63 mcg.  Okra (frozen) -  cup has 44 mcg.  Prunes (dried) - 5 prunes have 25 mcg.  Watercress (raw) - 1 cup has 85 mcg. Low vitamin K foods Foods low in vitamin K contain less than 25 mcg per serving. These include:  Artichoke - 1 medium has 18 mcg.  Avocado - 1 oz. has 6 mcg.  Blueberries -  cup has 14 mcg.  Cabbage (raw) -  cup has 21 mcg.  Carrots (cooked) -  cup has 11 mcg.  Cauliflower (raw) -  cup has 11 mcg.  Cucumber with peel (raw) -  cup has 9 mcg.  Grapes -  cup has 12 mcg.  Mango - 1 medium has 9 mcg.  Nuts - 1 oz. has 15 mcg.  Pear - 1 medium has 8 mcg.  Peas (cooked) -  cup has 19 mcg.  Pickles - 1 spear has 14 mcg.  Pumpkin seeds - 1 oz. has 13 mcg.  Sauerkraut (canned) -  cup has 16 mcg.  Soybeans (cooked) -  cup has 16 mcg.    Tomato (raw) - 1 medium has 10 mcg.  Tomato sauce -  cup has 17 mcg. Vitamin K-free foods If a food contain less than 5 mcg per serving, it is considered to have no vitamin K. These foods include:  Bread and cereal products.  Cheese.  Eggs.  Fish and shellfish.  Meat and poultry.  Milk and dairy products.  Sunflower seeds. Actual amounts of vitamin K in foods may be different depending on processing. Talk with your dietitian about what foods you can eat and what foods you should avoid. This information is not intended to replace advice given to you by your health care provider. Make sure you discuss any questions you have with your health care provider. Document Released: 03/01/2009 Document Revised: 11/24/2015 Document Reviewed: 08/07/2015 Elsevier Interactive Patient Education  2017 [employer]  

## 2016-12-14 ENCOUNTER — Emergency Department (HOSPITAL_COMMUNITY): Payer: Medicare Other

## 2016-12-14 ENCOUNTER — Inpatient Hospital Stay (HOSPITAL_COMMUNITY)
Admission: EM | Admit: 2016-12-14 | Discharge: 2016-12-17 | DRG: 308 | Disposition: A | Discharge status: Home Health | Payer: Medicare Other | Attending: Family Medicine | Admitting: Family Medicine

## 2016-12-14 ENCOUNTER — Encounter (HOSPITAL_COMMUNITY): Payer: Self-pay | Admitting: Internal Medicine

## 2016-12-14 ENCOUNTER — Ambulatory Visit (INDEPENDENT_AMBULATORY_CARE_PROVIDER_SITE_OTHER): Payer: Self-pay | Admitting: Pharmacist

## 2016-12-14 DIAGNOSIS — Z885 Allergy status to narcotic agent status: Secondary | ICD-10-CM

## 2016-12-14 DIAGNOSIS — R531 Weakness: Secondary | ICD-10-CM | POA: Diagnosis not present

## 2016-12-14 DIAGNOSIS — I482 Chronic atrial fibrillation: Secondary | ICD-10-CM | POA: Diagnosis present

## 2016-12-14 DIAGNOSIS — D649 Anemia, unspecified: Secondary | ICD-10-CM | POA: Diagnosis present

## 2016-12-14 DIAGNOSIS — N182 Chronic kidney disease, stage 2 (mild): Secondary | ICD-10-CM | POA: Diagnosis not present

## 2016-12-14 DIAGNOSIS — I5043 Acute on chronic combined systolic (congestive) and diastolic (congestive) heart failure: Secondary | ICD-10-CM | POA: Diagnosis not present

## 2016-12-14 DIAGNOSIS — J90 Pleural effusion, not elsewhere classified: Secondary | ICD-10-CM | POA: Diagnosis not present

## 2016-12-14 DIAGNOSIS — Z45018 Encounter for adjustment and management of other part of cardiac pacemaker: Secondary | ICD-10-CM

## 2016-12-14 DIAGNOSIS — R002 Palpitations: Secondary | ICD-10-CM | POA: Diagnosis not present

## 2016-12-14 DIAGNOSIS — D631 Anemia in chronic kidney disease: Secondary | ICD-10-CM | POA: Diagnosis not present

## 2016-12-14 DIAGNOSIS — I739 Peripheral vascular disease, unspecified: Secondary | ICD-10-CM | POA: Diagnosis not present

## 2016-12-14 DIAGNOSIS — I428 Other cardiomyopathies: Secondary | ICD-10-CM | POA: Diagnosis not present

## 2016-12-14 DIAGNOSIS — Z950 Presence of cardiac pacemaker: Secondary | ICD-10-CM | POA: Diagnosis not present

## 2016-12-14 DIAGNOSIS — I130 Hypertensive heart and chronic kidney disease with heart failure and stage 1 through stage 4 chronic kidney disease, or unspecified chronic kidney disease: Secondary | ICD-10-CM | POA: Diagnosis not present

## 2016-12-14 DIAGNOSIS — I255 Ischemic cardiomyopathy: Secondary | ICD-10-CM | POA: Diagnosis not present

## 2016-12-14 DIAGNOSIS — Z882 Allergy status to sulfonamides status: Secondary | ICD-10-CM | POA: Diagnosis not present

## 2016-12-14 DIAGNOSIS — I4891 Unspecified atrial fibrillation: Secondary | ICD-10-CM

## 2016-12-14 DIAGNOSIS — Z7901 Long term (current) use of anticoagulants: Secondary | ICD-10-CM

## 2016-12-14 DIAGNOSIS — N184 Chronic kidney disease, stage 4 (severe): Secondary | ICD-10-CM | POA: Diagnosis not present

## 2016-12-14 DIAGNOSIS — E785 Hyperlipidemia, unspecified: Secondary | ICD-10-CM | POA: Diagnosis present

## 2016-12-14 DIAGNOSIS — Z9181 History of falling: Secondary | ICD-10-CM

## 2016-12-14 DIAGNOSIS — I10 Essential (primary) hypertension: Secondary | ICD-10-CM | POA: Diagnosis not present

## 2016-12-14 DIAGNOSIS — Z8249 Family history of ischemic heart disease and other diseases of the circulatory system: Secondary | ICD-10-CM

## 2016-12-14 DIAGNOSIS — I5042 Chronic combined systolic (congestive) and diastolic (congestive) heart failure: Secondary | ICD-10-CM | POA: Diagnosis not present

## 2016-12-14 DIAGNOSIS — I5041 Acute combined systolic (congestive) and diastolic (congestive) heart failure: Secondary | ICD-10-CM | POA: Diagnosis present

## 2016-12-14 DIAGNOSIS — D638 Anemia in other chronic diseases classified elsewhere: Secondary | ICD-10-CM | POA: Diagnosis not present

## 2016-12-14 DIAGNOSIS — I4892 Unspecified atrial flutter: Secondary | ICD-10-CM | POA: Diagnosis not present

## 2016-12-14 DIAGNOSIS — Z9981 Dependence on supplemental oxygen: Secondary | ICD-10-CM | POA: Diagnosis not present

## 2016-12-14 DIAGNOSIS — I160 Hypertensive urgency: Secondary | ICD-10-CM | POA: Diagnosis not present

## 2016-12-14 DIAGNOSIS — J9601 Acute respiratory failure with hypoxia: Secondary | ICD-10-CM | POA: Diagnosis present

## 2016-12-14 DIAGNOSIS — Z8673 Personal history of transient ischemic attack (TIA), and cerebral infarction without residual deficits: Secondary | ICD-10-CM | POA: Diagnosis not present

## 2016-12-14 DIAGNOSIS — Z79891 Long term (current) use of opiate analgesic: Secondary | ICD-10-CM | POA: Diagnosis not present

## 2016-12-14 DIAGNOSIS — I480 Paroxysmal atrial fibrillation: Secondary | ICD-10-CM | POA: Diagnosis not present

## 2016-12-14 DIAGNOSIS — N183 Chronic kidney disease, stage 3 (moderate): Secondary | ICD-10-CM | POA: Diagnosis not present

## 2016-12-14 DIAGNOSIS — Z888 Allergy status to other drugs, medicaments and biological substances status: Secondary | ICD-10-CM

## 2016-12-14 DIAGNOSIS — E876 Hypokalemia: Secondary | ICD-10-CM | POA: Diagnosis present

## 2016-12-14 DIAGNOSIS — Q603 Renal hypoplasia, unilateral: Secondary | ICD-10-CM | POA: Diagnosis not present

## 2016-12-14 DIAGNOSIS — Z5181 Encounter for therapeutic drug level monitoring: Secondary | ICD-10-CM | POA: Diagnosis not present

## 2016-12-14 DIAGNOSIS — R404 Transient alteration of awareness: Secondary | ICD-10-CM | POA: Diagnosis not present

## 2016-12-14 DIAGNOSIS — D72829 Elevated white blood cell count, unspecified: Secondary | ICD-10-CM | POA: Diagnosis present

## 2016-12-14 LAB — BASIC METABOLIC PANEL
Anion gap: 14 (ref 5–15)
BUN: 66 mg/dL — ABNORMAL HIGH (ref 6–20)
CALCIUM: 8.5 mg/dL — AB (ref 8.9–10.3)
CO2: 32 mmol/L (ref 22–32)
CREATININE: 2.07 mg/dL — AB (ref 0.44–1.00)
Chloride: 89 mmol/L — ABNORMAL LOW (ref 101–111)
GFR calc non Af Amer: 21 mL/min — ABNORMAL LOW (ref >60)
GFR, EST AFRICAN AMERICAN: 25 mL/min — AB (ref >60)
Glucose, Bld: 96 mg/dL (ref 65–99)
Potassium: 2.9 mmol/L — ABNORMAL LOW (ref 3.5–5.1)
SODIUM: 135 mmol/L (ref 135–145)

## 2016-12-14 LAB — CBC
HCT: 31.8 % — ABNORMAL LOW (ref 36.0–46.0)
Hemoglobin: 9.9 g/dL — ABNORMAL LOW (ref 12.0–15.0)
MCH: 28.9 pg (ref 26.0–34.0)
MCHC: 31.1 g/dL (ref 30.0–36.0)
MCV: 92.7 fL (ref 78.0–100.0)
PLATELETS: 291 10*3/uL (ref 150–400)
RBC: 3.43 MIL/uL — AB (ref 3.87–5.11)
RDW: 13.4 % (ref 11.5–15.5)
WBC: 13.1 10*3/uL — AB (ref 4.0–10.5)

## 2016-12-14 LAB — I-STAT TROPONIN, ED: TROPONIN I, POC: 0.05 ng/mL (ref 0.00–0.08)

## 2016-12-14 LAB — PROTIME-INR
INR: 2.12
INR: 2.5 — AB (ref <=1.1)
PROTHROMBIN TIME: 24 s — AB (ref 11.4–15.2)

## 2016-12-14 LAB — POC OCCULT BLOOD, ED: Fecal Occult Bld: NEGATIVE

## 2016-12-14 LAB — BRAIN NATRIURETIC PEPTIDE: B Natriuretic Peptide: 919.4 pg/mL — ABNORMAL HIGH (ref 0.0–100.0)

## 2016-12-14 MED ORDER — POTASSIUM CHLORIDE CRYS ER 20 MEQ PO TBCR
40.0000 meq | EXTENDED_RELEASE_TABLET | Freq: Once | ORAL | Status: AC
  Administered 2016-12-14: 40 meq via ORAL
  Filled 2016-12-14: qty 2

## 2016-12-14 NOTE — Telephone Encounter (Signed)
Thank you for the update.   We gave instructions to patient and daughter to hold warfarin until Monday. Will repeat INR on Monday with [facility] (see anti-coagulation note from 12/11/16).  Will talk to patient today while nurse at home for further assessment and warfarin instructions.

## 2016-12-14 NOTE — ED Provider Notes (Signed)
[facility] DEPT Provider Note   CSN: [id number] Arrival date & time: 12/14/16  1920     History   Chief Complaint Chief Complaint  Patient presents with  . Palpitations    HPI Valerie Terrell is a 83 y.o. female who presents with palpitations and dizziness. PMH significant for CHF EF 35-40%, left kidney atrophy, stage IV CKD, hx of CVA, HLD, HTN, PAF on Coumadin. She was admitted from 7/16-7/23 for acute hypoxic respiratory failure due to pulmonary edema and ARF. She also had tachy-brady syndrome and had pacemaker placed on 7/19 and was started on amiodirone. She was diuresed and felt a lot better and was discharged home. She is on 3L O2 chronically. She is on 160mg  Lasix daily and has been taking this as prescribed. She states she has felt overall weak and had multiple falls this week. Also had 1 black stool today which she has never had before. INR was 6.9 on 7/27 and 2.5 today. She reports having a colonoscopy ~3 years ago which was remarkable for polyps. She had an episode of chest pressure earlier today which resolved. At about 5PM she had an acute onset of palpitations. EMS was called and HR was 80-130s. It soon resolved after EMS arrived.   HPI  Past Medical History:  Diagnosis Date  . Atrophy of left kidney    with absent blood flow by renal artery dopplers (Dr. Gwenlyn Found)  . 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 Chronic renal insufficiency, stage 4 (severe) (HCC)    Baseline GFR 25 ml/min-- renal u/s showed atrophic/hypoplastic left kidney.  No hydronephrosis.  Small right renal cyst.  . History of adenomatous polyp of colon   . History of subarachnoid hemorrhage 10/2014   after syncope and while on xarelto  . Hyperlipidemia   . Hypertension    Difficult to control, in the setting of one functioning kidney: pt was referred to nephrology by Dr. Gwenlyn Found 06/2015.  . Lumbar radiculopathy 2012  . Lumbar spondylosis    MR 07/2016---no sign of spinal nerve compression or  cord compression.  Pt set up with outpt ortho while admitted to hosp 07/2016.  . Metatarsal fracture 06/10/2016   Nondisplaced, left 5th metatarsal--pt was referred to ortho  . Nonischemic cardiomyopathy ([place])   . Osteopenia 2014   T-score -2.1  . PAF (paroxysmal atrial fibrillation) (HCC)    Eliquis started after BRAO and CVA  . Pulmonary edema 11/2016  . Stroke [facility])    cardioembolic (had CVA while on no anticoag)--"scattered subacute punctate infarcts: 1 in R parietal lobe and 2 in occipital cortex" on MRI br.  CT angio head/neck: aortic arch athero.   . Thoracic compression fracture ([place]) 07/2016   T7 and T8-- T8 kyphoplasty during hosp admission 07/2016.  Neuro referred pt to pain mgmt for consideration of injection 09/2016.  I referred her to endo 08/2016 for consideration of calcitonin treatment.    Patient Active Problem List   Diagnosis Date Noted  . Atrial fibrillation ([place]) [I48.91] 12/11/2016  . Long term (current) use of anticoagulants [Z79.01] 12/11/2016  . Tachycardia-bradycardia syndrome ([place]) - s/p PPM 12/07/2016  . Nonischemic cardiomyopathy ([place]): EF 35-40% 12/07/2016  . Acute combined systolic and diastolic heart failure ([place]) - possibly related to A. fib 12/07/2016  . Cardiac pacemaker   . Acute respiratory failure with hypoxia ([place])   . Acute pulmonary edema with congestive heart failure ([place]) 11/30/2016  . Elevated troponin level 11/30/2016  . Anemia 11/30/2016  .  Chronic midline thoracic back pain 09/15/2016  . Acute CVA (cerebrovascular accident) ([place]) 09/15/2016  . Hyperlipidemia   . Nausea & vomiting 08/05/2016  . Dehydration 08/05/2016  . AKI (acute kidney injury) ([place]) 08/05/2016  . Osteoporosis 07/28/2016  . Closed compression fracture of thoracic vertebra ([place]) 07/23/2016  . Thoracic compression fracture ([place]) 07/22/2016  . Acute renal failure superimposed on chronic kidney disease ([place]) 07/11/2016  . Flank pain 07/11/2016  . Hypoalbuminemia 07/11/2016   . Renal artery stenosis ([place]) 06/24/2016  . Nondisplaced fracture of fifth metatarsal bone, left foot, initial encounter for closed fracture 06/10/2016  . Rib contusion, left, initial encounter 06/10/2016  . Single kidney 03/13/2016  . Chest pain   . Dyslipidemia   . PAF (paroxysmal atrial fibrillation) ([place]) 01/28/2016  . Essential hypertension 01/28/2016  . Cardioembolic stroke ([place]) 32/99/2426  . 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 Personal history of subarachnoid hemorrhage 01/28/2016  . CKD (chronic kidney disease), stage II 01/28/2016    Past Surgical History:  Procedure Laterality Date  . APPENDECTOMY  child  . CARDIOVASCULAR STRESS TEST  01/31/2016   Stress myoview: NORMAL/Low risk.  EF 56%.  . [place]  . COLONOSCOPY  2015   + hx of adenomatous polyps.  Need digest health spec in [facility] records to see when pt due for next colonoscopy  . IR GENERIC HISTORICAL  07/24/2016   IR KYPHO THORACIC WITH BONE BIOPSY 07/24/2016 Luanne Bras, MD [facility]  . KYPHOPLASTY  07/27/2016   T8  . PACEMAKER IMPLANT N/A 12/03/2016   Procedure: Pacemaker Implant;  Surgeon: Evans Lance, MD;  Location: [facility];  Service: Cardiovascular;  Laterality: N/A;  . Renal artery dopplers  02/26/2016   Her right renal dimension was 11 cm pole to pole with mild to moderate right renal artery stenosis. A right renal aortic ratio was 3.22 suggesting less than a 50% stenosis.  . TONSILLECTOMY    . TRANSTHORACIC ECHOCARDIOGRAM  01/29/2016; 11/2016   2017: EF 55-60%, normal LV wall motion, grade I DD.  No cardiac source of emboli was seen.  2018: EF 35-40%, could not assess DD due to a-fib, pulm HTN noted.    OB History    No data available       Home Medications    Prior to Admission medications   Medication Sig Start Date End Date Taking? Authorizing Provider  acetaminophen (TYLENOL) 500 MG tablet Take 500 mg by mouth every 6 (six) hours as  needed for moderate pain.    [provider]  amiodarone (PACERONE) 200 MG tablet Take 1 tablet (200 mg total) by mouth 2 (two) times daily. 12/07/16   Johnson, Clanford L, MD  Calcium Carb-Cholecalciferol (CALCIUM/VITAMIN D PO) Take 1 tablet by mouth 2 (two) times daily.    [provider]  carvedilol (COREG) 6.25 MG tablet Take 1 tablet (6.25 mg total) by mouth 2 (two) times daily with a meal. 12/07/16   Johnson, Clanford L, MD  feeding supplement, ENSURE ENLIVE, (ENSURE ENLIVE) LIQD Take 237 mLs by mouth 2 (two) times daily between meals. 12/07/16   Johnson, Clanford L, MD  fluticasone (CUTIVATE) 0.05 % cream Apply topically 2 (two) times daily. 11/23/16   McGowen, Adrian Blackwater, MD  furosemide (LASIX) 80 MG tablet Take 2 tablets (160 mg total) by mouth 2 (two) times daily. 12/07/16 01/06/17  Irwin Brakeman L, MD  hydrALAZINE (APRESOLINE) 100 MG tablet Take 1 tablet (100 mg total) by  mouth 3 (three) times daily. 08/12/16   McGowen, Adrian Blackwater, MD  ibandronate (BONIVA) 150 MG tablet Take 1 tablet (150 mg total) by mouth every 30 (thirty) days. Take in the morning with a full glass of water, on an empty stomach, and do not take anything else by mouth or lie down for the next 30 min. 08/12/16   McGowen, Adrian Blackwater, MD  Multiple Vitamin (MULTIVITAMIN) tablet Take 1 tablet by mouth 3 (three) times a week.     [provider]  ondansetron (ZOFRAN) 4 MG tablet Take 0.5-1 tablets (2-4 mg total) by mouth every 8 (eight) hours as needed for nausea or vomiting. 12/10/16   McGowen, Adrian Blackwater, MD  oxyCODONE-acetaminophen (PERCOCET/ROXICET) 5-325 MG tablet Take 1-2 tablets by mouth every 6 (six) hours as needed for severe pain. 08/24/16   McGowen, Adrian Blackwater, MD  pravastatin (PRAVACHOL) 40 MG tablet Take 40 mg by mouth every evening. 07/16/15   [provider]  vitamin C (ASCORBIC ACID) 500 MG tablet Take 500 mg by mouth daily.    [provider]  Vitamin D, Ergocalciferol, (DRISDOL) 50000  units CAPS capsule 1 cap po q week x 12 weeks Patient taking differently: Take 50,000 Units by mouth every 7 (seven) days. For 12 weeks. (Thursdays) 11/09/16   McGowen, Adrian Blackwater, MD  warfarin (COUMADIN) 3 MG tablet Take 1 tablet (3 mg total) by mouth daily. 12/07/16 12/17/16  Murlean Iba, MD    Family History Family History  Problem Relation Age of Onset  . Cancer Mother   . Heart disease Father   . Early death Father   . Sudden Cardiac Death Neg Hx     Social History Social History  Substance Use Topics  . Smoking status: Never Smoker  . Smokeless tobacco: Never Used  . Alcohol use 0.6 oz/week    1 Glasses of wine per week     Comment: wine     Allergies   Clonidine derivatives; Codeine; Nickel; and Sulfa antibiotics   Review of Systems Review of Systems  Constitutional: Negative for fever.  Respiratory: Negative for cough and shortness of breath.   Cardiovascular: Positive for chest pain (resolved), palpitations and leg swelling.  Gastrointestinal: Negative for abdominal pain.  Genitourinary: Negative for dysuria.  Musculoskeletal: Positive for gait problem.       +falls  Neurological: Positive for weakness and light-headedness. Negative for syncope.     Physical Exam Updated Vital Signs BP (!) 188/70 (BP Location: Left Arm)   Pulse 72   Temp 97.9 F (36.6 C) (Oral)   Resp 20   Ht 4\' 11"  (1.499 m)   Wt 48.1 kg (106 lb)   SpO2 99%   BMI 21.41 kg/m   Physical Exam  Constitutional: She is oriented to person, place, and time. She appears well-developed and well-nourished. No distress.  Frail elderly female in NAD. On 3L O2 via [place]  HENT:  Head: Normocephalic and atraumatic.  Eyes: Pupils are equal, round, and reactive to light. Conjunctivae are normal. Right eye exhibits no discharge. Left eye exhibits no discharge. No scleral icterus.  Neck: Normal range of motion.  Cardiovascular: Normal rate and regular rhythm.  Exam reveals no gallop and no friction  rub.   No murmur heard. Pulmonary/Chest: Effort normal. No respiratory distress. She has no wheezes. She has rales. She exhibits no tenderness.  Decreased on left  Abdominal: She exhibits no distension.  Genitourinary:  Genitourinary Comments: Rectal: No gross blood, hemorrhoids, fissures, redness,  area of fluctuance, lesions, or tenderness. Chaperone present during exam.   Musculoskeletal:  Significant pitting edema from foot to mid calf bilaterally  Neurological: She is alert and oriented to person, place, and time.  Skin: Skin is warm and dry.  Psychiatric: She has a normal mood and affect. Her behavior is normal.  Nursing note and vitals reviewed.    ED Treatments / Results  Labs (all labs ordered are listed, but only abnormal results are displayed) Labs Reviewed  BASIC METABOLIC PANEL  CBC  PROTIME-INR  BRAIN NATRIURETIC PEPTIDE  I-STAT TROPONIN, ED  POC OCCULT BLOOD, ED    EKG  EKG Interpretation  Date/Time:  Monday December 14 2016 19:25:54 EDT Ventricular Rate:  71 PR Interval:    QRS Duration: 91 QT Interval:  358 QTC Calculation: 389 R Axis:   27 Text Interpretation:  Ectopic atrial rhythm Nonspecific T abnormalities, anterior leads When compared to prior,  sinus rhythm.  No STEMI Confirmed by Antony Blackbird [phone]) on 12/14/2016 7:50:31 PM       Radiology Dg Chest Port 1 View  Result Date: 12/14/2016 CLINICAL DATA:  Palpitations and dizziness. Recent placement of dual-chamber pacemaker for bradycardia on 12/03/2016. EXAM: PORTABLE CHEST 1 VIEW COMPARISON:  12/04/2016 FINDINGS: Radiographic appearance of the dual-chamber pacemaker is stable. The heart is moderately enlarged. Based on portable technique today, overall heart size is felt to likely be stable despite appearing slightly more prominent. There likely is a component of left pleural fluid which may be slightly increased compared to the prior study. No pneumothorax or pulmonary edema evident. IMPRESSION:  Stable appearance of pacemaker. Moderate cardiac enlargement which is felt to be most likely stable. There may be some enlargement of a left pleural effusion. Electronically Signed   By: Aletta Edouard M.D.   On: 12/14/2016 20:17    Procedures Procedures (including critical care time)  Medications Ordered in ED Medications - No data to display   Initial Impression / Assessment and Plan / ED Course  I have reviewed the triage vital signs and the nursing notes.  Pertinent labs & imaging results that were available during my care of the patient were reviewed by me and considered in my medical decision making (see chart for details).  83 year old female presents with palpitations and lightheadedness with overall generalized weakness and now black stools on Coumadin with recent INR of 6.9. She is hypertensive in the ED, otherwise vitals are normal. Pacemaker was interrogated and showed 45 min of flutter between 5-6:30pm. Weight is 106 today was 111 on 7/25. CXR shows some enlargement of pleural effusion. Clinically she is volume overloaded however BNP is at baseline and weight is down. Also concern for GIB on [place] in the setting of an INR of 6.9 several days ago. She is also very deconditioned and weak. Will bring in for obs. Discussed with Dr. Roel Cluck with [facility] who will admit.  Final Clinical Impressions(s) / ED Diagnoses   Final diagnoses:  Palpitations    New Prescriptions New Prescriptions   No medications on file     Recardo Evangelist, Hershal Coria 12/15/16 1629    Tegeler, Gwenyth Allegra, MD 12/20/16 [phone]

## 2016-12-14 NOTE — ED Triage Notes (Signed)
Per EMS, pt from home, reports having a pacemaker placed x 2 weeks ago, started to have episodes of palpitations and dizziness.  Reports dizziness at random not specifically with the palpitations.  Pt is A&Ox 4.

## 2016-12-14 NOTE — H&P (Signed)
Valerie Terrell JYN:[id number] DOB: 03-16-1933 DOA: 12/14/2016     PCP: Tammi Sou, MD   Outpatient Specialists: Cardiology Gwenlyn Found, nephrology Dr. Florene Glen Patient coming from:   home Lives   With family    Chief Complaint: Feeling palpitations and lightheadedness  HPI: Valerie Terrell is a 83 y.o. female with medical history significant of HTN, A.fib, CKD stage 4, HLD, HTN, hx of CVA, hx of subarachnoid hemorrhage chronic respiratory failure on oxygen 3 L at baseline    Presented with intermittent episodes of lightheadedness and occasional palpitations not always associated with lightheadedness. She reports episodes started only today at noon.  He's been feeling overall fairly weak and hasn't had recurrent falls. Reports one large  black stool today which is different from prior last colonoscopy was 3 years ago and showed polyps had an episode of chest pressure today that she has resolved. During palpitations she called EMS heart rate was found to be between 80 to 130s her pacemaker has been interrogated showing atrial flutter.      Last  Admission was from 16 to 23rd of July 2018 secondary to repeat his to be CHF exacerbation with elevated troponin up to 0.11 times BNP was 4500 Her creatinine at that time was 4.2 up from baseline of 2 developed acute hypoxic respiratory failure secondary to pulmonary edema echogram showed EF 50-55 percent patient was diuresed and improved She was found to have tachybradycardia syndrome and pacemaker was placed on 19 of  July Amiodarone was started Troponin remained stable felt to be  due to demand ischemia. Patient has known history of paroxysmal atrial fibrillation - CHA2DS2-VASc Scoreat least 5.  She is on  warfarin for anticoagulation, and 27th of July patient's INR was 6.9 her Coumadin has been held for the past 3 days   IN ER:  Temp (24hrs), Avg:97.9 F (36.6 C), Min:97.9 F (36.6 C), Max:97.9 F (36.6 C)      on arrival  ED Triage Vitals   Enc Vitals Group     BP 12/14/16 1902 (!) 166/68     Pulse Rate 12/14/16 1902 75     Resp 12/14/16 1927 20     Temp 12/14/16 1927 97.9 F (36.6 C)     Temp Source 12/14/16 1927 Oral     SpO2 12/14/16 1902 99 %     Weight 12/14/16 1927 106 lb (48.1 kg)     Height 12/14/16 1927 4\' 11"  (1.499 m)     Head Circumference --      Peak Flow --      Pain Score --      Pain Loc --      Pain Edu? --      Excl. in [place]? --   RR 19 100% HR 74 BP 183/72 Trop 0.05 Na 135 K 2.9 Cr 2.07 WBC 13.1 Hg 9.9 PLT 291 INR 2.12 BNP 919 Hemoccult negative  CXR: Enlargement of Left pleural effusion  Following Medications were ordered in ER: Medications  potassium chloride SA (K-DUR,KLOR-CON) CR tablet 40 mEq (not administered)      Hospitalist was called for admission for Diastolic CHF exacerbation  Review of Systems:    Pertinent positives include:  dyspnea on exertion, fatigue, dizziness, palpitations.  Constitutional:  No weight loss, night sweats, Fevers, chills,  weight loss  HEENT:  No headaches, Difficulty swallowing,Tooth/dental problems,Sore throat,  No sneezing, itching, ear ache, nasal congestion, post nasal drip,  Cardio-vascular:  No chest pain, Orthopnea, PND, anasarca, no Bilateral  lower extremity swelling  GI:  No heartburn, indigestion, abdominal pain, nausea, vomiting, diarrhea, change in bowel habits, loss of appetite, melena, blood in stool, hematemesis Resp:  no shortness of breath at rest. No, No excess mucus, no productive cough, No non-productive cough, No coughing up of blood.No change in color of mucus.No wheezing. Skin:  no rash or lesions. No jaundice GU:  no dysuria, change in color of urine, no urgency or frequency. No straining to urinate.  No flank pain.  Musculoskeletal:  No joint pain or no joint swelling. No decreased range of motion. No back pain.  Psych:  No change in mood or affect. No depression or anxiety. No memory loss.  Neuro: no localizing  neurological complaints, no tingling, no weakness, no double vision, no gait abnormality, no slurred speech, no confusion  As per HPI otherwise 10 point review of systems negative.   Past Medical History: Past Medical History:  Diagnosis Date  . Atrophy of left kidney    with absent blood flow by renal artery dopplers (Dr. Gwenlyn Found)  . 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 Chronic renal insufficiency, stage 4 (severe) (HCC)    Baseline GFR 25 ml/min-- renal u/s showed atrophic/hypoplastic left kidney.  No hydronephrosis.  Small right renal cyst.  . History of adenomatous polyp of colon   . History of subarachnoid hemorrhage 10/2014   after syncope and while on xarelto  . Hyperlipidemia   . Hypertension    Difficult to control, in the setting of one functioning kidney: pt was referred to nephrology by Dr. Gwenlyn Found 06/2015.  . Lumbar radiculopathy 2012  . Lumbar spondylosis    MR 07/2016---no sign of spinal nerve compression or cord compression.  Pt set up with outpt ortho while admitted to hosp 07/2016.  . Metatarsal fracture 06/10/2016   Nondisplaced, left 5th metatarsal--pt was referred to ortho  . Nonischemic cardiomyopathy ([place])   . Osteopenia 2014   T-score -2.1  . PAF (paroxysmal atrial fibrillation) (HCC)    Eliquis started after BRAO and CVA  . Pulmonary edema 11/2016  . Stroke [facility])    cardioembolic (had CVA while on no anticoag)--"scattered subacute punctate infarcts: 1 in R parietal lobe and 2 in occipital cortex" on MRI br.  CT angio head/neck: aortic arch athero.   . Thoracic compression fracture ([place]) 07/2016   T7 and T8-- T8 kyphoplasty during hosp admission 07/2016.  Neuro referred pt to pain mgmt for consideration of injection 09/2016.  I referred her to endo 08/2016 for consideration of calcitonin treatment.   Past Surgical History:  Procedure Laterality Date  . APPENDECTOMY  child  . CARDIOVASCULAR STRESS TEST  01/31/2016   Stress myoview: NORMAL/Low risk.   EF 56%.  . [place]  . COLONOSCOPY  2015   + hx of adenomatous polyps.  Need digest health spec in [facility] records to see when pt due for next colonoscopy  . IR GENERIC HISTORICAL  07/24/2016   IR KYPHO THORACIC WITH BONE BIOPSY 07/24/2016 Luanne Bras, MD [facility]-INTER[facility]  . KYPHOPLASTY  07/27/2016   T8  . PACEMAKER IMPLANT N/A 12/03/2016   Procedure: Pacemaker Implant;  Surgeon: Evans Lance, MD;  Location: [place] CV LAB;  Service: Cardiovascular;  Laterality: N/A;  . Renal artery dopplers  02/26/2016   Her right renal dimension was 11 cm pole to pole with mild to moderate right renal artery stenosis. A right renal aortic ratio was 3.22 suggesting less than a 50%  stenosis.  . TONSILLECTOMY    . TRANSTHORACIC ECHOCARDIOGRAM  01/29/2016; 11/2016   2017: EF 55-60%, normal LV wall motion, grade I DD.  No cardiac source of emboli was seen.  2018: EF 35-40%, could not assess DD due to a-fib, pulm HTN noted.     Social History:  Ambulatory cane or walker       reports that she has never smoked. She has never used smokeless tobacco. She reports that she drinks about 0.6 oz of alcohol per week . She reports that she does not use drugs.  Allergies:   Allergies  Allergen Reactions  . Clonidine Derivatives Palpitations and Other (See Comments)    Very sedated  . Codeine Nausea Only  . Nickel Rash  . Sulfa Antibiotics Other (See Comments)    Reaction unknown       Family History:   Family History  Problem Relation Age of Onset  . Cancer Mother   . Heart disease Father   . Early death Father   . Sudden Cardiac Death Neg Hx     Medications: Prior to Admission medications   Medication Sig Start Date End Date Taking? Authorizing Provider  acetaminophen (TYLENOL) 500 MG tablet Take 500 mg by mouth every 6 (six) hours as needed for moderate pain.   Yes [provider]  amiodarone (PACERONE) 200 MG tablet Take 1 tablet (200 mg total) by mouth 2 (two)  times daily. 12/07/16  Yes Johnson, Clanford L, MD  Calcium Carb-Cholecalciferol (CALCIUM/VITAMIN D PO) Take 1 tablet by mouth 2 (two) times daily.   Yes [provider]  carvedilol (COREG) 6.25 MG tablet Take 1 tablet (6.25 mg total) by mouth 2 (two) times daily with a meal. 12/07/16  Yes Johnson, Clanford L, MD  feeding supplement, ENSURE ENLIVE, (ENSURE ENLIVE) LIQD Take 237 mLs by mouth 2 (two) times daily between meals. 12/07/16  Yes Johnson, Clanford L, MD  fluticasone (CUTIVATE) 0.05 % cream Apply topically 2 (two) times daily. 11/23/16  Yes McGowen, Adrian Blackwater, MD  furosemide (LASIX) 80 MG tablet Take 2 tablets (160 mg total) by mouth 2 (two) times daily. 12/07/16 01/06/17 Yes Johnson, Clanford L, MD  hydrALAZINE (APRESOLINE) 100 MG tablet Take 1 tablet (100 mg total) by mouth 3 (three) times daily. 08/12/16  Yes McGowen, Adrian Blackwater, MD  ibandronate (BONIVA) 150 MG tablet Take 1 tablet (150 mg total) by mouth every 30 (thirty) days. Take in the morning with a full glass of water, on an empty stomach, and do not take anything else by mouth or lie down for the next 30 min. 08/12/16  Yes McGowen, Adrian Blackwater, MD  Multiple Vitamin (MULTIVITAMIN) tablet Take 1 tablet by mouth 3 (three) times a week.    Yes [provider]  ondansetron (ZOFRAN) 4 MG tablet Take 0.5-1 tablets (2-4 mg total) by mouth every 8 (eight) hours as needed for nausea or vomiting. 12/10/16  Yes McGowen, Adrian Blackwater, MD  oxyCODONE-acetaminophen (PERCOCET/ROXICET) 5-325 MG tablet Take 1-2 tablets by mouth every 6 (six) hours as needed for severe pain. 08/24/16  Yes McGowen, Adrian Blackwater, MD  pravastatin (PRAVACHOL) 40 MG tablet Take 40 mg by mouth every evening. 07/16/15  Yes [provider]  vitamin C (ASCORBIC ACID) 500 MG tablet Take 500 mg by mouth daily.   Yes [provider]  Vitamin D, Ergocalciferol, (DRISDOL) 50000 units CAPS capsule 1 cap po q week x 12 weeks Patient taking differently: Take 50,000 Units by  mouth every 7 (  seven) days. For 12 weeks. (Thursdays) 11/09/16  Yes McGowen, Adrian Blackwater, MD  warfarin (COUMADIN) 3 MG tablet Take 1 tablet (3 mg total) by mouth daily. 12/07/16 12/17/16 Yes Murlean Iba, MD    Physical Exam: Patient Vitals for the past 24 hrs:  BP Temp Temp src Pulse Resp SpO2 Height Weight  12/14/16 1927 (!) 188/70 97.9 F (36.6 C) Oral 72 20 99 % 4\' 11"  (1.499 m) 48.1 kg (106 lb)  12/14/16 1902 (!) 166/68 - - 75 - 99 % - -    1. General:  in No Acute distress 2. Psychological: Alert and  Oriented 3. Head/ENT:   Moist   Mucous Membranes                          Head Non traumatic, neck supple                            Poor Dentition 4. SKIN:  decreased Skin turgor,  Skin clean Dry and intact no rash 5. Heart: Regular rate and rhythm systolic  Murmur, Rub or gallop 6. Lungs: no wheezes some crackles   7. Abdomen: Soft,  non-tender, Non distended 8. Lower extremities: no clubbing, cyanosis, 2+ edema 9. Neurologically Grossly intact, moving all 4 extremities equally  10. MSK: Normal range of motion   body mass index is 21.41 kg/m.  Labs on Admission:   Labs on Admission: I have personally reviewed following labs and imaging studies  CBC:  Recent Labs Lab 12/09/16 1150 12/14/16 2026  WBC 10.7* 13.1*  NEUTROABS 9.0*  --   HGB 10.6* 9.9*  HCT 32.6* 31.8*  MCV 92.9 92.7  PLT 257.0 962   Basic Metabolic Panel:  Recent Labs Lab 12/09/16 1150 12/14/16 2026  NA 133* 135  K 3.6 2.9*  CL 90* 89*  CO2 34* 32  GLUCOSE 105* 96  BUN 78* 66*  CREATININE 2.54* 2.07*  CALCIUM 9.2 8.5*   GFR: Estimated Creatinine Clearance: 14.5 mL/min (A) (by C-G formula based on SCr of 2.07 mg/dL (H)). Liver Function Tests: No results for input(s): AST, ALT, ALKPHOS, BILITOT, PROT, ALBUMIN in the last 168 hours. No results for input(s): LIPASE, AMYLASE in the last 168 hours. No results for input(s): AMMONIA in the last 168 hours. Coagulation Profile:  Recent  Labs Lab 12/11/16 1552 12/11/16 1625 12/14/16 12/14/16 2026  INR >8.0 6.9* 2.5* 2.12   Cardiac Enzymes: No results for input(s): CKTOTAL, CKMB, CKMBINDEX, TROPONINI in the last 168 hours. BNP (last 3 results) No results for input(s): PROBNP in the last 8760 hours. HbA1C: No results for input(s): HGBA1C in the last 72 hours. CBG: No results for input(s): GLUCAP in the last 168 hours. Lipid Profile: No results for input(s): CHOL, HDL, LDLCALC, TRIG, CHOLHDL, LDLDIRECT in the last 72 hours. Thyroid Function Tests: No results for input(s): TSH, T4TOTAL, FREET4, T3FREE, THYROIDAB in the last 72 hours. Anemia Panel: No results for input(s): VITAMINB12, FOLATE, FERRITIN, TIBC, IRON, RETICCTPCT in the last 72 hours. Urine analysis:  Sepsis Labs: @LABRCNTIP (procalcitonin:4,lacticidven:4) )No results found for this or any previous visit (from the past 240 hour(s)).    UA  not ordered  Lab Results  Component Value Date   HGBA1C 5.6 01/29/2016    Estimated Creatinine Clearance: 14.5 mL/min (A) (by C-G formula based on SCr of 2.07 mg/dL (H)).  BNP (last 3 results) No results for input(s): PROBNP in the  last 8760 hours.   ECG REPORT  Independently reviewed Rate: 71   Rhythm: A.fib ST&T Change: No acute ischemic changes  QTC 389  Filed Weights   12/14/16 1927  Weight: 48.1 kg (106 lb)     Cultures:    Component Value Date/Time   SDES URINE, RANDOM 07/11/2016 1105   SPECREQUEST NONE 07/11/2016 1105   CULT NO GROWTH 07/11/2016 1105   REPTSTATUS 07/12/2016 FINAL 07/11/2016 1105     Radiological Exams on Admission: Dg Chest Port 1 View  Result Date: 12/14/2016 CLINICAL DATA:  Palpitations and dizziness. Recent placement of dual-chamber pacemaker for bradycardia on 12/03/2016. EXAM: PORTABLE CHEST 1 VIEW COMPARISON:  12/04/2016 FINDINGS: Radiographic appearance of the dual-chamber pacemaker is stable. The heart is moderately enlarged. Based on portable technique today,  overall heart size is felt to likely be stable despite appearing slightly more prominent. There likely is a component of left pleural fluid which may be slightly increased compared to the prior study. No pneumothorax or pulmonary edema evident. IMPRESSION: Stable appearance of pacemaker. Moderate cardiac enlargement which is felt to be most likely stable. There may be some enlargement of a left pleural effusion. Electronically Signed   By: Aletta Edouard M.D.   On: 12/14/2016 20:17    Chart has been reviewed    Assessment/Plan  83 y.o. female with medical history significant of HTN, A.fib, CKD stage 4, HLD, HTN, hx of CVA, hx of subarachnoid hemorrhage chronic respiratory failure on oxygen 3 L at baseline Admitted for limitations chest discomfort possible mild diastolic CHF exacerbation  Present on Admission: . Palpitations -   most likely due to run of a flutter currently rate controlled continue home medications and observe on telemetry overnight . Acute combined systolic and diastolic heart failure (HCC) - possibly related to A. fib mild will diurese with IV dose of Lasix patient at home 160 twice a day. We'll likely need  follow-up with cardiology    .Chronic Atrial fibrillation (HCC)        - CHA2DS2 vas score 5 : continue current anticoagulation with  Coumadin per pharmacy,          -  Rate control:  Currently controlled with coreg will continue        - Rhythm control:  Continue amiodarone  . Cardiac pacemaker - in place recently interrogated, stable . CKD (chronic kidney disease), stage II creatinine back to baseline avoid nephrotoxic medications continue to monitor . Essential hypertension - stable resume home medications . Hypokalemia - - will replace and repeat in AM,  check magnesium level and replace as needed   . Anemia - stable continue to monitor, reports one large dark BM but Hemoccult negative continue to monitor repeat serial Hemoccult    Other plan as per  orders.  DVT prophylaxis:  coumadin   Code Status:  FULL CODE  as per patient    Family Communication:   Family not at  Bedside    Disposition Plan:    To home once workup is complete and patient is stable                         Would benefit from PT/OT eval prior to DC   Ordered due to unsteady gait and risk of falls  Consults called: -email cardiology    Admission status:    obs   Level of care     tele         I have spent a total of 56 min on this admission    Valerie Terrell 12/15/2016, 12:09 AM    [facility]  Pager [phone]   after 2 AM please page floor coverage PA If 7AM-7PM, please contact the day team taking care of the patient  [email or web address]  Password [email or web address]

## 2016-12-15 ENCOUNTER — Encounter (HOSPITAL_COMMUNITY): Payer: Self-pay | Admitting: Internal Medicine

## 2016-12-15 ENCOUNTER — Other Ambulatory Visit: Payer: Self-pay

## 2016-12-15 ENCOUNTER — Ambulatory Visit: Payer: Self-pay

## 2016-12-15 DIAGNOSIS — D649 Anemia, unspecified: Secondary | ICD-10-CM | POA: Diagnosis present

## 2016-12-15 DIAGNOSIS — I480 Paroxysmal atrial fibrillation: Secondary | ICD-10-CM | POA: Diagnosis not present

## 2016-12-15 DIAGNOSIS — Z950 Presence of cardiac pacemaker: Secondary | ICD-10-CM | POA: Diagnosis not present

## 2016-12-15 DIAGNOSIS — I428 Other cardiomyopathies: Secondary | ICD-10-CM

## 2016-12-15 DIAGNOSIS — Z885 Allergy status to narcotic agent status: Secondary | ICD-10-CM | POA: Diagnosis not present

## 2016-12-15 DIAGNOSIS — I160 Hypertensive urgency: Secondary | ICD-10-CM | POA: Diagnosis not present

## 2016-12-15 DIAGNOSIS — E785 Hyperlipidemia, unspecified: Secondary | ICD-10-CM | POA: Diagnosis present

## 2016-12-15 DIAGNOSIS — Z7901 Long term (current) use of anticoagulants: Secondary | ICD-10-CM | POA: Diagnosis not present

## 2016-12-15 DIAGNOSIS — Q603 Renal hypoplasia, unilateral: Secondary | ICD-10-CM | POA: Diagnosis not present

## 2016-12-15 DIAGNOSIS — Z888 Allergy status to other drugs, medicaments and biological substances status: Secondary | ICD-10-CM | POA: Diagnosis not present

## 2016-12-15 DIAGNOSIS — E876 Hypokalemia: Secondary | ICD-10-CM | POA: Diagnosis not present

## 2016-12-15 DIAGNOSIS — Z45018 Encounter for adjustment and management of other part of cardiac pacemaker: Secondary | ICD-10-CM | POA: Diagnosis not present

## 2016-12-15 DIAGNOSIS — N184 Chronic kidney disease, stage 4 (severe): Secondary | ICD-10-CM

## 2016-12-15 DIAGNOSIS — I10 Essential (primary) hypertension: Secondary | ICD-10-CM | POA: Diagnosis not present

## 2016-12-15 DIAGNOSIS — I5043 Acute on chronic combined systolic (congestive) and diastolic (congestive) heart failure: Secondary | ICD-10-CM

## 2016-12-15 DIAGNOSIS — R002 Palpitations: Secondary | ICD-10-CM | POA: Diagnosis present

## 2016-12-15 DIAGNOSIS — Z882 Allergy status to sulfonamides status: Secondary | ICD-10-CM | POA: Diagnosis not present

## 2016-12-15 DIAGNOSIS — Z9181 History of falling: Secondary | ICD-10-CM | POA: Diagnosis not present

## 2016-12-15 DIAGNOSIS — I130 Hypertensive heart and chronic kidney disease with heart failure and stage 1 through stage 4 chronic kidney disease, or unspecified chronic kidney disease: Secondary | ICD-10-CM | POA: Diagnosis present

## 2016-12-15 DIAGNOSIS — I4892 Unspecified atrial flutter: Secondary | ICD-10-CM | POA: Diagnosis not present

## 2016-12-15 DIAGNOSIS — Z9981 Dependence on supplemental oxygen: Secondary | ICD-10-CM | POA: Diagnosis not present

## 2016-12-15 DIAGNOSIS — I482 Chronic atrial fibrillation: Secondary | ICD-10-CM | POA: Diagnosis present

## 2016-12-15 DIAGNOSIS — I5041 Acute combined systolic (congestive) and diastolic (congestive) heart failure: Secondary | ICD-10-CM | POA: Diagnosis not present

## 2016-12-15 DIAGNOSIS — Z8673 Personal history of transient ischemic attack (TIA), and cerebral infarction without residual deficits: Secondary | ICD-10-CM | POA: Diagnosis not present

## 2016-12-15 DIAGNOSIS — I4891 Unspecified atrial fibrillation: Secondary | ICD-10-CM | POA: Diagnosis not present

## 2016-12-15 DIAGNOSIS — N182 Chronic kidney disease, stage 2 (mild): Secondary | ICD-10-CM | POA: Diagnosis not present

## 2016-12-15 DIAGNOSIS — Z8249 Family history of ischemic heart disease and other diseases of the circulatory system: Secondary | ICD-10-CM | POA: Diagnosis not present

## 2016-12-15 DIAGNOSIS — J9601 Acute respiratory failure with hypoxia: Secondary | ICD-10-CM | POA: Diagnosis present

## 2016-12-15 LAB — COMPREHENSIVE METABOLIC PANEL
ALBUMIN: 2.5 g/dL — AB (ref 3.5–5.0)
ALT: 12 U/L — AB (ref 14–54)
AST: 34 U/L (ref 15–41)
Alkaline Phosphatase: 50 U/L (ref 38–126)
Anion gap: 9 (ref 5–15)
BUN: 60 mg/dL — ABNORMAL HIGH (ref 6–20)
CHLORIDE: 90 mmol/L — AB (ref 101–111)
CO2: 36 mmol/L — AB (ref 22–32)
CREATININE: 2.01 mg/dL — AB (ref 0.44–1.00)
Calcium: 8.2 mg/dL — ABNORMAL LOW (ref 8.9–10.3)
GFR calc non Af Amer: 22 mL/min — ABNORMAL LOW (ref >60)
GFR, EST AFRICAN AMERICAN: 26 mL/min — AB (ref >60)
GLUCOSE: 112 mg/dL — AB (ref 65–99)
Potassium: 3.4 mmol/L — ABNORMAL LOW (ref 3.5–5.1)
SODIUM: 135 mmol/L (ref 135–145)
Total Bilirubin: 0.3 mg/dL (ref 0.3–1.2)
Total Protein: 5.6 g/dL — ABNORMAL LOW (ref 6.5–8.1)

## 2016-12-15 LAB — CBC
HCT: 31.3 % — ABNORMAL LOW (ref 36.0–46.0)
Hemoglobin: 9.9 g/dL — ABNORMAL LOW (ref 12.0–15.0)
MCH: 29.4 pg (ref 26.0–34.0)
MCHC: 31.6 g/dL (ref 30.0–36.0)
MCV: 92.9 fL (ref 78.0–100.0)
PLATELETS: 276 10*3/uL (ref 150–400)
RBC: 3.37 MIL/uL — AB (ref 3.87–5.11)
RDW: 13.5 % (ref 11.5–15.5)
WBC: 10.3 10*3/uL (ref 4.0–10.5)

## 2016-12-15 LAB — TROPONIN I
Troponin I: 0.05 ng/mL (ref <0.03)
Troponin I: 0.04 ng/mL (ref <0.03)
Troponin I: 0.03 ng/mL (ref <0.03)

## 2016-12-15 LAB — PROTIME-INR
INR: 1.93
PROTHROMBIN TIME: 22.3 s — AB (ref 11.4–15.2)

## 2016-12-15 LAB — MAGNESIUM: Magnesium: 1.9 mg/dL (ref 1.7–2.4)

## 2016-12-15 LAB — PHOSPHORUS: Phosphorus: 2.2 mg/dL — ABNORMAL LOW (ref 2.5–4.6)

## 2016-12-15 LAB — TSH: TSH: 3.563 u[IU]/mL (ref 0.350–4.500)

## 2016-12-15 MED ORDER — SODIUM CHLORIDE 0.9% FLUSH: 3.0000 mL | INTRAVENOUS | Status: DC | PRN

## 2016-12-15 MED ORDER — POTASSIUM CHLORIDE CRYS ER 20 MEQ PO TBCR
40.0000 meq | EXTENDED_RELEASE_TABLET | Freq: Once | ORAL | Status: AC
  Administered 2016-12-15: 40 meq via ORAL
  Filled 2016-12-15: qty 2

## 2016-12-15 MED ORDER — ONDANSETRON HCL 4 MG PO TABS
4.0000 mg | ORAL_TABLET | Freq: Four times a day (QID) | ORAL | Status: DC | PRN
  Administered 2016-12-15: 4 mg via ORAL
  Filled 2016-12-15: qty 1

## 2016-12-15 MED ORDER — CARVEDILOL 6.25 MG PO TABS
6.2500 mg | ORAL_TABLET | Freq: Two times a day (BID) | ORAL | Status: DC
  Administered 2016-12-15: 6.25 mg via ORAL
  Filled 2016-12-15: qty 1
  Filled 2016-12-15: qty 0.5
  Filled 2016-12-15: qty 1

## 2016-12-15 MED ORDER — FUROSEMIDE 10 MG/ML IJ SOLN
80.0000 mg | Freq: Two times a day (BID) | INTRAMUSCULAR | Status: DC
  Administered 2016-12-15 – 2016-12-17 (×4): 80 mg via INTRAVENOUS
  Filled 2016-12-15 (×4): qty 8

## 2016-12-15 MED ORDER — WARFARIN - PHARMACIST DOSING INPATIENT
Freq: Every day | Status: DC
  Administered 2016-12-15: 18:00:00
  Administered 2016-12-16: 1.5

## 2016-12-15 MED ORDER — SODIUM CHLORIDE 0.9% FLUSH
3.0000 mL | Freq: Two times a day (BID) | INTRAVENOUS | Status: DC
  Administered 2016-12-15 – 2016-12-17 (×6): 3 mL via INTRAVENOUS

## 2016-12-15 MED ORDER — ACETAMINOPHEN 650 MG RE SUPP: 650.0000 mg | Freq: Four times a day (QID) | RECTAL | Status: DC | PRN

## 2016-12-15 MED ORDER — CARVEDILOL 25 MG PO TABS
25.0000 mg | ORAL_TABLET | Freq: Two times a day (BID) | ORAL | Status: DC
  Administered 2016-12-16 – 2016-12-17 (×3): 25 mg via ORAL
  Filled 2016-12-15 (×6): qty 1

## 2016-12-15 MED ORDER — HYDRALAZINE HCL 50 MG PO TABS
100.0000 mg | ORAL_TABLET | Freq: Three times a day (TID) | ORAL | Status: DC
  Administered 2016-12-15 – 2016-12-17 (×7): 100 mg via ORAL
  Filled 2016-12-15 (×8): qty 2

## 2016-12-15 MED ORDER — POTASSIUM CHLORIDE ER 10 MEQ PO TBCR
20.0000 meq | EXTENDED_RELEASE_TABLET | Freq: Two times a day (BID) | ORAL | Status: DC
  Administered 2016-12-16 – 2016-12-17 (×3): 20 meq via ORAL
  Filled 2016-12-15 (×7): qty 2

## 2016-12-15 MED ORDER — FUROSEMIDE 10 MG/ML IJ SOLN
160.0000 mg | Freq: Two times a day (BID) | INTRAVENOUS | Status: DC
  Administered 2016-12-15: 160 mg via INTRAVENOUS
  Filled 2016-12-15 (×3): qty 16

## 2016-12-15 MED ORDER — HYDROCODONE-ACETAMINOPHEN 5-325 MG PO TABS: 1.0000 | ORAL_TABLET | ORAL | Status: DC | PRN

## 2016-12-15 MED ORDER — SODIUM CHLORIDE 0.9 % IV SOLN: 250.0000 mL | INTRAVENOUS | Status: DC | PRN

## 2016-12-15 MED ORDER — OXYCODONE-ACETAMINOPHEN 5-325 MG PO TABS: 1.0000 | ORAL_TABLET | Freq: Four times a day (QID) | ORAL | Status: DC | PRN

## 2016-12-15 MED ORDER — WARFARIN SODIUM 3 MG PO TABS
1.5000 mg | ORAL_TABLET | Freq: Every day | ORAL | Status: DC
  Administered 2016-12-15 – 2016-12-16 (×2): 1.5 mg via ORAL
  Filled 2016-12-15 (×2): qty 0.5
  Filled 2016-12-15: qty 1

## 2016-12-15 MED ORDER — ENSURE ENLIVE PO LIQD
237.0000 mL | Freq: Two times a day (BID) | ORAL | Status: DC
  Administered 2016-12-15: 237 mL via ORAL
  Filled 2016-12-15 (×2): qty 237

## 2016-12-15 MED ORDER — ONDANSETRON HCL 4 MG/2ML IJ SOLN
4.0000 mg | Freq: Four times a day (QID) | INTRAMUSCULAR | Status: DC | PRN
  Administered 2016-12-16: 4 mg via INTRAVENOUS
  Filled 2016-12-15: qty 2

## 2016-12-15 MED ORDER — SODIUM CHLORIDE 0.9% FLUSH
3.0000 mL | Freq: Two times a day (BID) | INTRAVENOUS | Status: DC
  Administered 2016-12-15 – 2016-12-16 (×3): 3 mL via INTRAVENOUS

## 2016-12-15 MED ORDER — ACETAMINOPHEN 325 MG PO TABS
650.0000 mg | ORAL_TABLET | Freq: Four times a day (QID) | ORAL | Status: DC | PRN
  Administered 2016-12-15 – 2016-12-16 (×3): 650 mg via ORAL
  Filled 2016-12-15 (×3): qty 2

## 2016-12-15 MED ORDER — AMIODARONE HCL 200 MG PO TABS
200.0000 mg | ORAL_TABLET | Freq: Two times a day (BID) | ORAL | Status: DC
  Administered 2016-12-15 – 2016-12-17 (×6): 200 mg via ORAL
  Filled 2016-12-15 (×6): qty 1

## 2016-12-15 MED ORDER — PRAVASTATIN SODIUM 40 MG PO TABS
40.0000 mg | ORAL_TABLET | Freq: Every evening | ORAL | Status: DC
  Administered 2016-12-15 – 2016-12-16 (×2): 40 mg via ORAL
  Filled 2016-12-15 (×2): qty 1

## 2016-12-15 NOTE — Plan of Care (Signed)
Problem: Education: Goal: Knowledge of [place] [facility] Education information/materials will improve Outcome: Completed/Met Date Met: 12/15/16 Pt educated on general [place] information   Problem: Safety: Goal: Ability to remain free from injury will improve Outcome: Progressing Pt's bed alarm is on and pt stated she will call when she needs to get up

## 2016-12-15 NOTE — ED Notes (Signed)
Troponin 0.05 per Main Lab

## 2016-12-15 NOTE — Care Management Note (Signed)
Case Management Note  Patient Details  Name: Valerie Terrell MRN: [id number] Date of Birth: 1933/03/22  Subjective/Objective: 83 year old female, her daughter lives with her, has a cane and walker at home, PMH of stage IV chronic kidney disease/left kidney atrophy, HLD, HTN, NICM, PAF, embolic stroke, subarachnoid hemorrhage, recent hospitalization 11/30/16-12/07/16 4 acute hypoxic respiratory failure due to decompensated CHF, acute renal failure, tachybradycardia syndrome for which pacemaker was placed 12/03/16, patient was discharged home on oxygen 3 L/m,                  Action/Plan: CM consulted for Obs status and to follow up with pt on HHS/DME needs.  Pt is currently with [facility] for HHS/DME of O2.  Pt states she has a cain and a walker, using walker in the shower currently.  CM asked if pt would like a shower chair and pt refused stating she could put her bedside commode in the shower instead.  CM will continue to follow on inpt unit.  Expected Discharge Date:                  Expected Discharge Plan:  [place]  Discharge planning Services  CM Consult  Post Acute Care Choice:  Durable Medical Equipment, Home Health Choice offered to:  Patient  DME Arranged:    DME Agency:  [place] Arranged:    [facility] Agency:  [place]  Status of Service:  In process, will continue to follow  Corky Crafts, RN 12/15/2016, 11:40 AM

## 2016-12-15 NOTE — Care Management Obs Status (Signed)
[place] NOTIFICATION   Patient Details  Name: Takila Kronberg MRN: [id number] Date of Birth: 1933-02-17   Medicare Observation Status Notification Given:  Yes    Corky Crafts, RN 12/15/2016, 11:38 AM

## 2016-12-15 NOTE — Progress Notes (Signed)
[facility]  Patient Status: Active (receiving services up to time of hospitalization)  [facility] is providing the following services: RN, PT and OT  If patient discharges after hours, please call [phone].   Valerie Terrell 12/15/2016, 4:42 PM

## 2016-12-15 NOTE — Progress Notes (Addendum)
PT Cancellation Note  Patient Details Name: Arsenia Goracke MRN: [id number] DOB: 10/17/1932   Cancelled Treatment:    Reason Eval/Treat Not Completed: Medical issues which prohibited therapy Pt with mildly elevated troponins and new onset a flutter. Pending cardiology consult. Will follow up once medically appropriate.   Leighton Ruff, PT, DPT  Acute Rehabilitation Services  Pager: [phone]    Rudean Hitt 12/15/2016, 11:42 AM

## 2016-12-15 NOTE — ED Notes (Signed)
Pt given meal and ginger ale.

## 2016-12-15 NOTE — ED Notes (Signed)
Bilateral hearing aids in pt ears. Purse and clothing @ bed side in pt belonging bag.

## 2016-12-15 NOTE — Patient Outreach (Signed)
[facility]) Care Management  12/15/2016  Valerie Terrell Oct 05, 1932 [id number]   Care Coordination-RNCM had scheduled home visit for today. RNCM received call from client's daughter this morning to cancel visit, informing RNCM that client was in the hospital.  Plan: follow up with client post discharge for transition of care.  Thea Silversmith, RN, MSN, [place] Coordinator Cell: [phone]

## 2016-12-15 NOTE — ED Notes (Signed)
Send message to pharmacy to send medications

## 2016-12-15 NOTE — Consult Note (Signed)
Cardiology Consultation:   [name]; [id number]; 1932-08-18   Admit date: 12/14/2016 Date of Consult: 12/15/2016  Primary Care Provider: Tammi Sou, MD Primary Cardiologist: Gwenlyn Found Primary Electrophysiologist:  None  Patient Profile:   Valerie Terrell is a 83 y.o. female with a hx of atrophy of the left kidney (absent blood flow by renal artery doppler, Dr. Gwenlyn Found), HTN, CRI (III), HLD, PAFib on a/c, record indciates hx of SAH.  The patient is being seen today for the evaluation of palpitations and heart failure at the request of Dr. Algis Liming.  History of Present Illness:   [name] was admitted to [facility] 11/30/16-12/07/16 with increasing DOE for a couple of weeks and felt to be fluid overloaded with diastolic CHF exacerbation. She presented with episodes of lightheadedness and occasional palpitations not always associated with lightheadedness. It started yesterday around noon. He has been feeling overall fairly weak and hasn't had recurrent falls. Has also had some GI bleeding. She was also having some palpitations rates between 80-130s her pacemaker was interrogated and showed showing atrial flutter.   Her admission diagnosis consist of palpitations, but currently rate controlled and on her home medications. Acute combined systolic and diastolic heart failure she is being gently diuresed with IV lasix of 160 mg (her home dose) BID. Most recent LVEF 35-40. Paroxysmal atrial fibrillation CHADVASC score of 5, she is being anticoagulated with coumadin by pharmacy; on Coreg and Amiodarone. She is now back into sinus rhythm. She was previously on Eliquis but this was changed due to worsening renal functions.   She has since been feeling well and reports urinating a lot. No longer feeling palpitations.   Blood pressure (!) 185/62, pulse 70, temperature 97.9 F (36.6 C), temperature source Oral, resp. rate (!) 27, height 4\' 11"  (1.499 m), weight 106 lb (48.1 kg), SpO2 99  %.   Past Medical History:  Diagnosis Date  . Atrophy of left kidney    with absent blood flow by renal artery dopplers (Dr. Gwenlyn Found)  . 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 Chronic renal insufficiency, stage 4 (severe) (HCC)    Baseline GFR 25 ml/min-- renal u/s showed atrophic/hypoplastic left kidney.  No hydronephrosis.  Small right renal cyst.  . History of adenomatous polyp of colon   . History of subarachnoid hemorrhage 10/2014   after syncope and while on xarelto  . Hyperlipidemia   . Hypertension    Difficult to control, in the setting of one functioning kidney: pt was referred to nephrology by Dr. Gwenlyn Found 06/2015.  . Lumbar radiculopathy 2012  . Lumbar spondylosis    MR 07/2016---no sign of spinal nerve compression or cord compression.  Pt set up with outpt ortho while admitted to hosp 07/2016.  . Metatarsal fracture 06/10/2016   Nondisplaced, left 5th metatarsal--pt was referred to ortho  . Nonischemic cardiomyopathy ([place])   . Osteopenia 2014   T-score -2.1  . PAF (paroxysmal atrial fibrillation) (HCC)    Eliquis started after BRAO and CVA  . Pulmonary edema 11/2016  . Stroke [facility])    cardioembolic (had CVA while on no anticoag)--"scattered subacute punctate infarcts: 1 in R parietal lobe and 2 in occipital cortex" on MRI br.  CT angio head/neck: aortic arch athero.   . Thoracic compression fracture ([place]) 07/2016   T7 and T8-- T8 kyphoplasty during hosp admission 07/2016.  Neuro referred pt to pain mgmt for consideration of injection 09/2016.  I referred her to endo 08/2016 for consideration  of calcitonin treatment.    Past Surgical History:  Procedure Laterality Date  . APPENDECTOMY  child  . CARDIOVASCULAR STRESS TEST  01/31/2016   Stress myoview: NORMAL/Low risk.  EF 56%.  . [place]  . COLONOSCOPY  2015   + hx of adenomatous polyps.  Need digest health spec in [place] records to see when pt due for next colonoscopy  . IR GENERIC HISTORICAL   07/24/2016   IR KYPHO THORACIC WITH BONE BIOPSY 07/24/2016 Luanne Bras, MD [facility]-INTERV RAD  . KYPHOPLASTY  07/27/2016   T8  . PACEMAKER IMPLANT N/A 12/03/2016   Procedure: Pacemaker Implant;  Surgeon: Evans Lance, MD;  Location: [place] CV LAB;  Service: Cardiovascular;  Laterality: N/A;  . Renal artery dopplers  02/26/2016   Her right renal dimension was 11 cm pole to pole with mild to moderate right renal artery stenosis. A right renal aortic ratio was 3.22 suggesting less than a 50% stenosis.  . TONSILLECTOMY    . TRANSTHORACIC ECHOCARDIOGRAM  01/29/2016; 11/2016   2017: EF 55-60%, normal LV wall motion, grade I DD.  No cardiac source of emboli was seen.  2018: EF 35-40%, could not assess DD due to a-fib, pulm HTN noted.     Inpatient Medications: Scheduled Meds: . amiodarone  200 mg Oral BID  . carvedilol  6.25 mg Oral BID WC  . feeding supplement (ENSURE ENLIVE)  237 mL Oral BID BM  . hydrALAZINE  100 mg Oral TID  . potassium chloride  40 mEq Oral Once  . pravastatin  40 mg Oral QPM  . sodium chloride flush  3 mL Intravenous Q12H  . sodium chloride flush  3 mL Intravenous Q12H  . warfarin  1.5 mg Oral q1800  . Warfarin - Pharmacist Dosing Inpatient   Does not apply q1800   Continuous Infusions: . sodium chloride    . furosemide Stopped (12/15/16 0930)   PRN Meds: sodium chloride, acetaminophen **OR** acetaminophen, ondansetron **OR** ondansetron (ZOFRAN) IV, oxyCODONE-acetaminophen, sodium chloride flush  Allergies:    Allergies  Allergen Reactions  . Clonidine Derivatives Palpitations and Other (See Comments)    Very sedated  . Codeine Nausea Only  . Nickel Rash  . Sulfa Antibiotics Other (See Comments)    Reaction unknown    Social History:   Social History   Social History  . Marital status: Widowed    Spouse name: N/A  . Number of children: N/A  . Years of education: N/A   Occupational History  . Not on file.   Social History Main Topics  .  Smoking status: Never Smoker  . Smokeless tobacco: Never Used  . Alcohol use 0.6 oz/week    1 Glasses of wine per week     Comment: wine  . Drug use: No  . Sexual activity: No   Other Topics Concern  . Not on file   Social History Narrative   Widow.  One daughter, lives with her.   Educ: college   Occup: retired Marine scientist.   No T/A/Ds.   She is almost a vegetarian.      Family History:   The patient's family history includes Cancer in her mother; Early death in her father; Heart disease in her father. There is no history of Sudden Cardiac Death.  ROS:  Please see the history of present illness.  All other ROS reviewed and negative.     Physical Exam/Data:   Vitals:   12/15/16 0515 12/15/16 0600 12/15/16  0700 12/15/16 0745  BP: (!) 159/58 (!) 169/58 (!) 170/60 (!) 185/62  Pulse: 67 62 69 70  Resp: (!) 23 18 19  (!) 27  Temp:      TempSrc:      SpO2: 100% 100% 99% 99%  Weight:      Height:        Intake/Output Summary (Last 24 hours) at 12/15/16 1159 Last data filed at 12/15/16 1052  Gross per 24 hour  Intake               50 ml  Output              225 ml  Net             -175 ml   Filed Weights   12/14/16 1927  Weight: 106 lb (48.1 kg)   Body mass index is 21.41 kg/m.  General: pleasant elderly female, frail, propped up in bed. Head: Normocephalic, atraumatic, Neck: Negative for carotid bruits. JVD not elevated. Lungs: Clear bilaterally to auscultation without wheezes, rales, or rhonchi. Breathing is unlabored. Heart: RRR with S1 S2. No murmurs, rubs, or gallops appreciated. Abdomen: Soft, non-tender, non-distended with normoactive bowel sounds. Msk:  Strength and tone appear normal for age. Extremities: No clubbing or cyanosis. _ le edema Neuro: Alert and oriented X 3. No facial asymmetry. Psych:  Responds to questions appropriately with a normal affect.  EKG:  The EKG was personally reviewed and demonstrates 71 HR, ectopic atrial rhythm.  Relevant CV  Studies:  12/02/2016 Echocardiogram  Study Conclusions  - Left ventricle: The cavity size was normal. There was moderate   concentric hypertrophy. Systolic function was moderately reduced.   The estimated ejection fraction was in the range of 35% to 40%.   Wall motion was normal; there were no regional wall motion   abnormalities. The study was not technically sufficient to allow   evaluation of LV diastolic dysfunction due to atrial   fibrillation. - Aortic valve: Valve mobility was restricted. Transvalvular   velocity was within the normal range. There was no stenosis.   There was no regurgitation. - Mitral valve: There was mild regurgitation. - Left atrium: The atrium was mildly dilated. - Right ventricle: The cavity size was moderately dilated. Wall   thickness was normal. Systolic function was moderately reduced. - Tricuspid valve: There was mild regurgitation. - Pulmonary arteries: Systolic pressure was moderately increased.   PA peak pressure: 47 mm Hg (S). - Inferior vena cava: The vessel was normal in size. - Pericardium, extracardiac: A trivial pericardial effusion was   identified posterior to the heart. Features were not consistent   with tamponade physiology.  Impressions:  - When compared to the prior study from 07/14/2016 LVEF has   decreased from 60-65% to 35-40%. RV is moderately dilated with   moderately decreased RVEF. There is RV strain. Consider workup   for pulmonary embolism.   The study acquired while the patient in atrial fibrillation with   RVR.   Laboratory Data:  Chemistry Recent Labs Lab 12/09/16 1150 12/14/16 2026 12/15/16 0455  NA 133* 135 135  K 3.6 2.9* 3.4*  CL 90* 89* 90*  CO2 34* 32 36*  GLUCOSE 105* 96 112*  BUN 78* 66* 60*  CREATININE 2.54* 2.07* 2.01*  CALCIUM 9.2 8.5* 8.2*  GFRNONAA  --  21* 22*  GFRAA  --  25* 26*  ANIONGAP  --  14 9     Recent Labs Lab 12/15/16 0455  PROT 5.6*  ALBUMIN 2.5*  AST 34  ALT 12*    ALKPHOS 50  BILITOT 0.3   Hematology Recent Labs Lab 12/09/16 1150 12/14/16 2026 12/15/16 0455  WBC 10.7* 13.1* 10.3  RBC 3.51* 3.43* 3.37*  HGB 10.6* 9.9* 9.9*  HCT 32.6* 31.8* 31.3*  MCV 92.9 92.7 92.9  MCH  --  28.9 29.4  MCHC 32.6 31.1 31.6  RDW 13.9 13.4 13.5  PLT 257.0 291 276   Cardiac Enzymes Recent Labs Lab 12/15/16 0131 12/15/16 0700  TROPONINI 0.05* 0.04*    Recent Labs Lab 12/14/16 2052  TROPIPOC 0.05    BNP Recent Labs Lab 12/14/16 2026  BNP 919.4*    Radiology/Studies:  Dg Chest Port 1 View  Result Date: 12/14/2016 CLINICAL DATA:  Palpitations and dizziness. Recent placement of dual-chamber pacemaker for bradycardia on 12/03/2016. EXAM: PORTABLE CHEST 1 VIEW COMPARISON:  12/04/2016 FINDINGS: Radiographic appearance of the dual-chamber pacemaker is stable. The heart is moderately enlarged. Based on portable technique today, overall heart size is felt to likely be stable despite appearing slightly more prominent. There likely is a component of left pleural fluid which may be slightly increased compared to the prior study. No pneumothorax or pulmonary edema evident. IMPRESSION: Stable appearance of pacemaker. Moderate cardiac enlargement which is felt to be most likely stable. There may be some enlargement of a left pleural effusion. Electronically Signed   By: Aletta Edouard M.D.   On: 12/14/2016 20:17     Assessment and Plan:   1. Paroxysmal atrial fibrillation flutter: She had had symptomatic palpitations this admission and her pacemaker showed atrial flutter 80-130 rate. She converted back into sinus rhythm, she is on amiodarone and carvedilol. CHA2DS2-VASc Scoreat least 5.  INR was supratherapeutic recently, 6.9 on 7/27. Down to 2.12 on 7/30. --  Continue Amiodarone (200mg  BID) and coumadin  2.  Acute on chronic combined systolic and diastolic CHF/NICM:  2-D echo 12/02/16: LVEF 35-40 percent. -- Continue lasix 160 mg IV lasix BID, not diuresing  well, -175 ml output (unsure if this is accurate ? Because pt is in the ER and she says she has been urinating a lot) , no weight check for today, will order strict in's and outs as well as daily weights. -- consider adding a dose of Torsemide or metolazone if truly not diuresing well. -- Creatinine is 2.01, was 2.54 at recent discharge.   3. Tachybrady syndrome: s/p PPM, interrogation showed episode of atrial flutter 80-130s  Signed, Linus Mako, PA-C  12/15/2016 11:59 AM   The patient was seen, examined and discussed with Delos Haring, PA-C and I agree with the above.   83 year old female with h/o chronic combined systolic and diastolic CHF, LVEF 75-64%, chronic stage IV CKD with atrophy of the left kidney (absent blood flow by renal artery doppler, Dr. Gwenlyn Found), HTN, CRI (III), HLD, PAF with tachybrady syndrome, s/p PTVDP-[employer]- implanted 12/03/16, Amiodarone started. On chronic warfarin therapy. She was admitted to [facility] 11/30/16-12/07/16 with increasing DOE for a couple of weeks and felt to be fluid overloaded with diastolic CHF exacerbation.  She presented today with episodes of lightheadedness and occasional palpitations not always associated with lightheadedness. It started yesterday around noon. He has been feeling overall fairly weak and hasn't had recurrent falls. Her pacemaker was interrogated and showed showing atrial flutter.   On physical exam she appears mildly fluid overloaded, her lung sounds clear but has 2+ pitting edema in both of her legs.   Baseline creatinine 2.5-2.8,  today 2.0, I will start Lasix 80 mg IV twice a day, the patient is hypertensive, because she has a pacemaker I will uptitrate her carvedilol to 25 mg by mouth twice a day.  Ena Dawley, MD 12/15/2016

## 2016-12-15 NOTE — Progress Notes (Signed)
ANTICOAGULATION CONSULT NOTE - Initial Consult  Pharmacy Consult for Coumadin Indication: atrial fibrillation  Allergies  Allergen Reactions  . Clonidine Derivatives Palpitations and Other (See Comments)    Very sedated  . Codeine Nausea Only  . Nickel Rash  . Sulfa Antibiotics Other (See Comments)    Reaction unknown    Patient Measurements: Height: 4\' 11"  (149.9 cm) Weight: 106 lb (48.1 kg) IBW/kg (Calculated) : 43.2  Vital Signs: Temp: 97.9 F (36.6 C) (07/30 1927) Temp Source: Oral (07/30 1927) BP: 166/64 (07/31 0430) Pulse Rate: 68 (07/31 0430)  Labs:  Recent Labs  12/14/16 12/14/16 2026 12/15/16 0131  HGB  --  9.9*  --   HCT  --  31.8*  --   PLT  --  291  --   LABPROT  --  24.0*  --   INR 2.5* 2.12  --   CREATININE  --  2.07*  --   TROPONINI  --   --  0.05*    Estimated Creatinine Clearance: 14.5 mL/min (A) (by C-G formula based on SCr of 2.07 mg/dL (H)).   Medical History: Past Medical History:  Diagnosis Date  . Atrophy of left kidney    with absent blood flow by renal artery dopplers (Dr. Gwenlyn Found)  . 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 Chronic renal insufficiency, stage 4 (severe) (HCC)    Baseline GFR 25 ml/min-- renal u/s showed atrophic/hypoplastic left kidney.  No hydronephrosis.  Small right renal cyst.  . History of adenomatous polyp of colon   . History of subarachnoid hemorrhage 10/2014   after syncope and while on xarelto  . Hyperlipidemia   . Hypertension    Difficult to control, in the setting of one functioning kidney: pt was referred to nephrology by Dr. Gwenlyn Found 06/2015.  . Lumbar radiculopathy 2012  . Lumbar spondylosis    MR 07/2016---no sign of spinal nerve compression or cord compression.  Pt set up with outpt ortho while admitted to hosp 07/2016.  . Metatarsal fracture 06/10/2016   Nondisplaced, left 5th metatarsal--pt was referred to ortho  . Nonischemic cardiomyopathy ([place])   . Osteopenia 2014   T-score -2.1  .  PAF (paroxysmal atrial fibrillation) (HCC)    Eliquis started after BRAO and CVA  . Pulmonary edema 11/2016  . Stroke [facility])    cardioembolic (had CVA while on no anticoag)--"scattered subacute punctate infarcts: 1 in R parietal lobe and 2 in occipital cortex" on MRI br.  CT angio head/neck: aortic arch athero.   . Thoracic compression fracture ([place]) 07/2016   T7 and T8-- T8 kyphoplasty during hosp admission 07/2016.  Neuro referred pt to pain mgmt for consideration of injection 09/2016.  I referred her to endo 08/2016 for consideration of calcitonin treatment.    Assessment: 83yo female had pacer placed 2wk ago, c/o episodes of palpitations and dizziness, admitted for telemetry, to continue Coumadin for Afib; current INR at goal with last dose of Coumadin taken 7/26 due to supratherapeutic INR at outpt anticoag clinic, was to resume Coumadin at lowered dose of 1.5mg  daily as of 7/30 but hadn't taken prior to arrival to ED.  Goal of Therapy:  INR 2-3   Plan:  Will start the Coumadin dose planned by outpatient clinic (1.5mg  daily) and monitor INR.  Wynona Neat, PharmD, BCPS  12/15/2016,4:40 AM

## 2016-12-15 NOTE — Progress Notes (Addendum)
PROGRESS NOTE   Valerie Terrell  TML:[id number]    DOB: [birth year]    DOA: 12/14/2016  PCP: Tammi Sou, MD   I have briefly reviewed patients previous medical records in [facility].  Brief Narrative:  83 year old female, her daughter lives with her, has a cane and walker at home, PMH of stage IV chronic kidney disease/left kidney atrophy, HLD, HTN, NICM, PAF, embolic stroke, subarachnoid hemorrhage, recent hospitalization 11/30/16-12/07/16 4 acute hypoxic respiratory failure due to decompensated CHF, acute renal failure, tachybradycardia syndrome for which pacemaker was placed 12/03/16, patient was discharged home on oxygen 3 L/m, Eliquis was changed to Coumadin and amiodarone was started, recent issues with supratherapeutic INR, presented to ED 12/14/16 with complaints of palpitations, episodes of lightheadedness not always associated with her palpitations that started at midday on day of admission, feeling weak but denied chest pain. Also reported an episode of "black stools" on day of admission but FOBT negative and rectal exam showed fleck brown stools without melena or blood on gloved finger. During palpitations, EMS found her heart rate between 80-130 and pacemaker interrogation showed atrial flutter. Cardiology consulted.   Assessment & Plan:   Active Problems:   Essential hypertension   CKD (chronic kidney disease), stage II   Anemia   Cardiac pacemaker   Nonischemic cardiomyopathy [facility]): EF 35-40%   Acute combined systolic and diastolic heart failure ([place]) - possibly related to A. fib   Atrial fibrillation (HCC) [I48.91]   Hypokalemia   Leukocytosis   Palpitations   1. Paroxysmal atrial fibrillation/atrial flutter: Symptomatic of palpitations on admission and pacemaker check apparently showed atrial flutter between 80-130. Currently reverted back to sinus rhythm. Continue amiodarone and carvedilol. CHA2DS2-VASc Scoreat least 5. Eliquis was changed to Coumadin during recent  admission due to worsening renal functions. Coumadin per pharmacy. INR was supratherapeutic recently, 6.9 on 7/27. Down to 2.12 on 7/30. Cardiology consulted. 2. Acute on chronic combined systolic and diastolic CHF/NICM: 2-D echo 12/02/16: LVEF 35-40 percent. Currently on IV Lasix 160 MG every 12 hours. Clinically improved. Await cardiology input regarding further management and adjustment of Lasix dose. 3. Recent elevated troponin: Suspected due to demand ischemia. Mild and flat trend. Myoview low risk September 2017. 4. Tachybradycardia syndrome: Status post PPM 12/03/16. Amiodarone started. Await cardiology follow-up. 5. Stage III chronic kidney disease: Recent acute kidney injury. Discharged with creatinine of 2.54 which has improved to 2.01. Outpatient follow-up with nephrology. 6. Hypokalemia: Replace and follow. Magnesium 1.9. 7. Essential hypertension: Mildly uncontrolled. Continue carvedilol and hydralazine. 8. Anemia: Hemoglobin stable. Reported "black stools 1" midday on 7/30. I personally perform rectal exam in ED with patient's female RN as chaperone which showed flecks of soft brown stool on gloved finger without melena or blood. FOBT negative. Follow CBC in a.m. Monitor. 9. Hyperlipidemia: Statins. 10. Acute hypoxic respiratory failure: Discharge on 3 L/m oxygen during recent hospitalization. Likely due to CHF. Oxygen supplements and wean as tolerated.  DVT prophylaxis: Coumadin per pharmacy. Currently therapeutically anticoagulated. Code Status: Full Family Communication: None at bedside Disposition: DC home when medically improved   Consultants:  Cardiology-pending   Procedures:  None  Antimicrobials:  None    Subjective: Seen this morning in the ED. Feels better. No further palpitations. Reported that she never had chest pain. No dizziness or lightheadedness. No BM since yesterday.   ROS: No cough or fever.  Objective:  Vitals:   12/15/16 0515 12/15/16 0600  12/15/16 0700 12/15/16 0745  BP: (!) 159/58 (!) 169/58 [name])  170/60 (!) 185/62  Pulse: 67 62 69 70  Resp: (!) 23 18 19  (!) 27  Temp:      TempSrc:      SpO2: 100% 100% 99% 99%  Weight:      Height:        Examination:  General exam: Pleasant elderly female, moderately built and frail, lying comfortably propped up in bed. Respiratory system: Occasional basal crackles but otherwise clear to auscultation. No increased work of breathing. Cardiovascular system: S1 & S2 heard, RRR. No JVD, murmurs, rubs, gallops or clicks. No pedal edema. Telemetry: Sinus rhythm. Gastrointestinal system: Abdomen is nondistended, soft and nontender. No organomegaly or masses felt. Normal bowel sounds heard. Rectal exam done in ED: No melena or blood. No acute findings. Central nervous system: Alert and oriented. No focal neurological deficits. Extremities: Symmetric 5 x 5 power. Skin: No rashes, lesions or ulcers Psychiatry: Judgement and insight appear normal. Mood & affect appropriate.     Data Reviewed: I have personally reviewed following labs and imaging studies  CBC:  Recent Labs Lab 12/09/16 1150 12/14/16 2026 12/15/16 0455  WBC 10.7* 13.1* 10.3  NEUTROABS 9.0*  --   --   HGB 10.6* 9.9* 9.9*  HCT 32.6* 31.8* 31.3*  MCV 92.9 92.7 92.9  PLT 257.0 291 938   Basic Metabolic Panel:  Recent Labs Lab 12/09/16 1150 12/14/16 2026 12/15/16 0455  NA 133* 135 135  K 3.6 2.9* 3.4*  CL 90* 89* 90*  CO2 34* 32 36*  GLUCOSE 105* 96 112*  BUN 78* 66* 60*  CREATININE 2.54* 2.07* 2.01*  CALCIUM 9.2 8.5* 8.2*  MG  --   --  1.9  PHOS  --   --  2.2*   Liver Function Tests:  Recent Labs Lab 12/15/16 0455  AST 34  ALT 12*  ALKPHOS 50  BILITOT 0.3  PROT 5.6*  ALBUMIN 2.5*   Coagulation Profile:  Recent Labs Lab 12/11/16 1552 12/11/16 1625 12/14/16 12/14/16 2026  INR >8.0 6.9* 2.5* 2.12   Cardiac Enzymes:  Recent Labs Lab 12/15/16 0131 12/15/16 0700  TROPONINI 0.05* 0.04*    HbA1C: No results for input(s): HGBA1C in the last 72 hours. CBG: No results for input(s): GLUCAP in the last 168 hours.  No results found for this or any previous visit (from the past 240 hour(s)).       Radiology Studies: Dg Chest Port 1 View  Result Date: 12/14/2016 CLINICAL DATA:  Palpitations and dizziness. Recent placement of dual-chamber pacemaker for bradycardia on 12/03/2016. EXAM: PORTABLE CHEST 1 VIEW COMPARISON:  12/04/2016 FINDINGS: Radiographic appearance of the dual-chamber pacemaker is stable. The heart is moderately enlarged. Based on portable technique today, overall heart size is felt to likely be stable despite appearing slightly more prominent. There likely is a component of left pleural fluid which may be slightly increased compared to the prior study. No pneumothorax or pulmonary edema evident. IMPRESSION: Stable appearance of pacemaker. Moderate cardiac enlargement which is felt to be most likely stable. There may be some enlargement of a left pleural effusion. Electronically Signed   By: Aletta Edouard M.D.   On: 12/14/2016 20:17        Scheduled Meds: . amiodarone  200 mg Oral BID  . carvedilol  6.25 mg Oral BID WC  . feeding supplement (ENSURE ENLIVE)  237 mL Oral BID BM  . hydrALAZINE  100 mg Oral TID  . pravastatin  40 mg Oral QPM  . sodium chloride flush  3 mL Intravenous Q12H  . sodium chloride flush  3 mL Intravenous Q12H  . warfarin  1.5 mg Oral q1800  . Warfarin - Pharmacist Dosing Inpatient   Does not apply q1800   Continuous Infusions: . sodium chloride    . furosemide Stopped (12/15/16 0930)     LOS: 0 days     Jaelene Garciagarcia, MD, FACP, FHM. [facility] Pager [phone]  If 7PM-7AM, please contact night-coverage [email or web address] Password [email or web address] 12/15/2016, 11:16 AM

## 2016-12-16 ENCOUNTER — Ambulatory Visit: Payer: Medicare Other

## 2016-12-16 DIAGNOSIS — N182 Chronic kidney disease, stage 2 (mild): Secondary | ICD-10-CM

## 2016-12-16 DIAGNOSIS — I5041 Acute combined systolic (congestive) and diastolic (congestive) heart failure: Secondary | ICD-10-CM

## 2016-12-16 DIAGNOSIS — S2231XA Fracture of one rib, right side, initial encounter for closed fracture: Secondary | ICD-10-CM

## 2016-12-16 DIAGNOSIS — I10 Essential (primary) hypertension: Secondary | ICD-10-CM

## 2016-12-16 HISTORY — DX: Fracture of one rib, right side, initial encounter for closed fracture: S22.31XA

## 2016-12-16 LAB — CBC
HCT: 29.9 % — ABNORMAL LOW (ref 36.0–46.0)
Hemoglobin: 9.3 g/dL — ABNORMAL LOW (ref 12.0–15.0)
MCH: 29.2 pg (ref 26.0–34.0)
MCHC: 31.1 g/dL (ref 30.0–36.0)
MCV: 93.7 fL (ref 78.0–100.0)
Platelets: 273 10*3/uL (ref 150–400)
RBC: 3.19 MIL/uL — ABNORMAL LOW (ref 3.87–5.11)
RDW: 13.6 % (ref 11.5–15.5)
WBC: 9.4 10*3/uL (ref 4.0–10.5)

## 2016-12-16 LAB — BASIC METABOLIC PANEL
Anion gap: 7 (ref 5–15)
BUN: 50 mg/dL — ABNORMAL HIGH (ref 6–20)
CALCIUM: 8.4 mg/dL — AB (ref 8.9–10.3)
CO2: 35 mmol/L — ABNORMAL HIGH (ref 22–32)
CREATININE: 1.79 mg/dL — AB (ref 0.44–1.00)
Chloride: 94 mmol/L — ABNORMAL LOW (ref 101–111)
GFR calc non Af Amer: 25 mL/min — ABNORMAL LOW (ref >60)
GFR, EST AFRICAN AMERICAN: 29 mL/min — AB (ref >60)
Glucose, Bld: 106 mg/dL — ABNORMAL HIGH (ref 65–99)
Potassium: 3.8 mmol/L (ref 3.5–5.1)
SODIUM: 136 mmol/L (ref 135–145)

## 2016-12-16 LAB — PROTIME-INR
INR: 1.74
PROTHROMBIN TIME: 20.6 s — AB (ref 11.4–15.2)

## 2016-12-16 LAB — HEMOGLOBIN A1C
Hgb A1c MFr Bld: 5.2 % (ref 4.8–5.6)
Mean Plasma Glucose: 103 mg/dL

## 2016-12-16 MED ORDER — ISOSORBIDE MONONITRATE ER 30 MG PO TB24
30.0000 mg | ORAL_TABLET | Freq: Every day | ORAL | Status: DC
  Administered 2016-12-16 – 2016-12-17 (×2): 30 mg via ORAL
  Filled 2016-12-16 (×2): qty 1

## 2016-12-16 NOTE — Evaluation (Signed)
Physical Therapy Evaluation Patient Details Name: Valerie Terrell MRN: [id number] DOB: May 24, 1932 Today's Date: 12/16/2016   History of Present Illness   Valerie Terrell is a 83 y.o. female admitted with lightheadedness and palpitations.  Pt recently d/c from hospital after undergoing pacemaker placement on 12/03/16. She has medical history significant of left kidney atrophy, retinal artery occlusion in the left eye, stage IV chronic renal insufficiency, history of subarachnoid hemorrhage, hyperlipidemia, hypertension, lumbar spondylosis, paroxysmal atrial fibrillation.  Clinical Impression  Pt admitted with above diagnosis. Pt currently with functional limitations due to the deficits listed below (see PT Problem List). Pt will benefit from skilled PT to increase their independence and safety with mobility to allow discharge to the venue listed below.  Pt is doing well with mobility with RW and o2 on 2 L/min with sat 92%.  Pt reports she was getting HHPT after d/c from hospital last week.  Recommend continuing HHPT.     Follow Up Recommendations Home health PT;Supervision - Intermittent    Equipment Recommendations  None recommended by PT    Recommendations for Other Services       Precautions / Restrictions Precautions Precautions: None Precaution Comments: pacemaker precautions Restrictions Weight Bearing Restrictions: No      Mobility  Bed Mobility Overal bed mobility: Modified Independent       Supine to sit: Modified independent (Device/Increase time);HOB elevated     General bed mobility comments: HOB elevated with MOD I  Transfers Overall transfer level: Needs assistance Equipment used: None Transfers: Sit to/from Stand Sit to Stand: Supervision         General transfer comment: S for safety   Ambulation/Gait Ambulation/Gait assistance: Min guard Ambulation Distance (Feet): 165 Feet Assistive device: Rolling walker (2 wheeled) Gait Pattern/deviations:  Step-through pattern;Trunk flexed     General Gait Details: Amb with RW with o2 at 2 L/min with o2 sat 92%.  Pt with flexed posture, but steady cadence and no rest breaks.  Stairs            Wheelchair Mobility    Modified Rankin (Stroke Patients Only)       Balance     Sitting balance-Leahy Scale: Good Sitting balance - Comments: Pt able to don pants in sitting     Standing balance-Leahy Scale: Fair Standing balance comment: Pt able to stand and pull up pants. Needs UE support for ambulation and more dynamic activities                             Pertinent Vitals/Pain Pain Assessment: No/denies pain    Home Living Family/patient expects to be discharged to:: Private residence Living Arrangements: Children (daughter) Available Help at Discharge: Family;Available 24 hours/day (daughter works from home) Type of Home: House Home Access: Stairs to enter Entrance Stairs-Rails: None Technical brewer of Steps: 3-4 Home Layout: One level Home Equipment: Environmental consultant - 2 wheels;Cane - quad;Bedside commode Additional Comments: Pt had 1 session of HHPT    Prior Function Level of Independence: Needs assistance   Gait / Transfers Assistance Needed: ambulating with cane and RW since d/c last week  ADL's / Homemaking Assistance Needed: assisted for LB dressing and all IADL  Comments: pt has not driven in several months     Hand Dominance   Dominant Hand: Right    Extremity/Trunk Assessment   Upper Extremity Assessment Upper Extremity Assessment: Overall WFL for tasks assessed    Lower Extremity Assessment Lower Extremity Assessment:  Overall [facility] for tasks assessed    Cervical / Trunk Assessment Cervical / Trunk Assessment: Kyphotic  Communication   Communication: No difficulties  Cognition Arousal/Alertness: Awake/alert Behavior During Therapy: WFL for tasks assessed/performed Overall Cognitive Status: Within Functional Limits for tasks assessed                                         General Comments      Exercises     Assessment/Plan    PT Assessment Patient needs continued PT services  PT Problem List Decreased activity tolerance;Decreased balance;Decreased mobility       PT Treatment Interventions DME instruction;Gait training;Stair training;Functional mobility training;Therapeutic activities;Therapeutic exercise;Balance training    PT Goals (Current goals can be found in the Care Plan section)  Acute Rehab PT Goals Patient Stated Goal: to go home today PT Goal Formulation: With patient Time For Goal Achievement: 12/23/16 Potential to Achieve Goals: Good    Frequency Min 3X/week   Barriers to discharge        Co-evaluation               AM-PAC PT "6 Clicks" Daily Activity  Outcome Measure Difficulty turning over in bed (including adjusting bedclothes, sheets and blankets)?: None Difficulty moving from lying on back to sitting on the side of the bed? : None Difficulty sitting down on and standing up from a chair with arms (e.g., wheelchair, bedside commode, etc,.)?: None Help needed moving to and from a bed to chair (including a wheelchair)?: A Little Help needed walking in hospital room?: A Little Help needed climbing 3-5 steps with a railing? : A Little 6 Click Score: 21    End of Session Equipment Utilized During Treatment: Oxygen Activity Tolerance: Patient tolerated treatment well Patient left: in chair;with call bell/phone within reach Nurse Communication: Mobility status PT Visit Diagnosis: Difficulty in walking, not elsewhere classified (R26.2)    Time: 4827-0786 PT Time Calculation (min) (ACUTE ONLY): 19 min   Charges:   PT Evaluation $PT Eval Low Complexity: 1 Low     PT G Codes:        Valerie Terrell L. Tamala Julian, [place] Pager [phone] 12/16/2016   Galen Manila 12/16/2016, 10:03 AM

## 2016-12-16 NOTE — Progress Notes (Signed)
Initial Nutrition Assessment  DOCUMENTATION CODES:   Severe malnutrition in context of chronic illness  INTERVENTION:   -[employer] Instant Breakfast BID between meals  -Smaller, more frequent meals (snacks order between meals)   NUTRITION DIAGNOSIS:   Malnutrition (Severe) related to  (CKD, CHF) as evidenced by severe depletion of body fat, severe depletion of muscle mass, severe fluid accumulation, percent weight loss.  GOAL:   Patient will meet greater than or equal to 90% of their needs  MONITOR:   PO intake, Supplement acceptance, Labs, Weight trends  REASON FOR ASSESSMENT:   Malnutrition Screening Tool    ASSESSMENT:   83 yo female admitted with palpitations, chronc afib. Pt with hx of HTN, CHF, CKD IV, CVA, subarachnoid hemorrhage, chronic respiratory failure   Recorded po intake 25% of meals. Pt reports she has been eating 3 meals per day at home. Reports she eats a lot of fruit; indicates meals might be soup and a 1/2 [place] wrap, baked good and fruit. Pt also reports she snacks throughout the day. Pt does not like Ensure but agreeable to trying [employer]  Weight continues to trend down since last admission; 4.5% wt loss in <1 month which is significant for time frame. Pf note, pt +edema so unsure of try dry weight  Nutrition-Focused physical exam completed. Findings are mild/moderate to severe fat depletion, mild/moderate to severe muscle depletion, and severe edema.   Labs: Creatinine 1.79, BUN 50 Meds: KCl, lasix  Diet Order:  Diet Heart Room service appropriate? Yes; Fluid consistency: Thin  Skin:  Reviewed, no issues  Last BM:  7/31  Height:   Ht Readings from Last 1 Encounters:  12/15/16 4\' 11"  (1.499 m)    Weight:   Wt Readings from Last 1 Encounters:  12/16/16 106 lb (48.1 kg)    Ideal Body Weight:     BMI:  Body mass index is 21.41 kg/m.  Estimated Nutritional Needs:   Kcal:  1400-1600 kcals  Protein:  72-82 g    Fluid:  >/= 1.4 L  EDUCATION NEEDS:   No education needs identified at this time  [place], [place], LDN [phone] Pager  [phone] Weekend/On-Call Pager

## 2016-12-16 NOTE — Progress Notes (Signed)
Progress Note  Patient Name: Valerie Terrell Date of Encounter: 12/16/2016  Primary Cardiologist: Dr Gwenlyn Found  Subjective   She feels better today, improved SOB,no palpitations.   Inpatient Medications    Scheduled Meds: . amiodarone  200 mg Oral BID  . carvedilol  25 mg Oral BID WC  . feeding supplement (ENSURE ENLIVE)  237 mL Oral BID BM  . furosemide  80 mg Intravenous BID  . hydrALAZINE  100 mg Oral TID  . potassium chloride  20 mEq Oral BID  . pravastatin  40 mg Oral QPM  . sodium chloride flush  3 mL Intravenous Q12H  . sodium chloride flush  3 mL Intravenous Q12H  . warfarin  1.5 mg Oral q1800  . Warfarin - Pharmacist Dosing Inpatient   Does not apply q1800   Continuous Infusions: . sodium chloride     PRN Meds: sodium chloride, acetaminophen **OR** acetaminophen, ondansetron **OR** ondansetron (ZOFRAN) IV, oxyCODONE-acetaminophen, sodium chloride flush   Vital Signs    Vitals:   12/15/16 2357 12/16/16 0500 12/16/16 0750 12/16/16 1221  BP: (!) 151/49 (!) 160/57 (!) 173/69 (!) 161/48  Pulse: 64 65 66 65  Resp: 20 20 18 18   Temp: 97.8 F (36.6 C) 98.8 F (37.1 C)  98.6 F (37 C)  TempSrc: Oral Oral  Oral  SpO2: 96% 98% 99% 96%  Weight:  106 lb (48.1 kg)    Height:        Intake/Output Summary (Last 24 hours) at 12/16/16 1300 Last data filed at 12/16/16 0644  Gross per 24 hour  Intake                0 ml  Output              800 ml  Net             -800 ml   Filed Weights   12/14/16 1927 12/15/16 1439 12/16/16 0500  Weight: 106 lb (48.1 kg) 105 lb 6.1 oz (47.8 kg) 106 lb (48.1 kg)    Telemetry    SR - Personally Reviewed  Physical Exam   GEN: No acute distress.   Neck: No JVD Cardiac: RRR, no murmurs, rubs, or gallops.  Respiratory: Clear to auscultation bilaterally. GI: Soft, nontender, non-distended  MS: mild nonpitting B/L edema; No deformity. Neuro:  Nonfocal  Psych: Normal affect   Labs    Chemistry Recent Labs Lab 12/14/16 2026  12/15/16 0455 12/16/16 0246  NA 135 135 136  K 2.9* 3.4* 3.8  CL 89* 90* 94*  CO2 32 36* 35*  GLUCOSE 96 112* 106*  BUN 66* 60* 50*  CREATININE 2.07* 2.01* 1.79*  CALCIUM 8.5* 8.2* 8.4*  PROT  --  5.6*  --   ALBUMIN  --  2.5*  --   AST  --  34  --   ALT  --  12*  --   ALKPHOS  --  50  --   BILITOT  --  0.3  --   GFRNONAA 21* 22* 25*  GFRAA 25* 26* 29*  ANIONGAP 14 9 7      Hematology Recent Labs Lab 12/14/16 2026 12/15/16 0455 12/16/16 0246  WBC 13.1* 10.3 9.4  RBC 3.43* 3.37* 3.19*  HGB 9.9* 9.9* 9.3*  HCT 31.8* 31.3* 29.9*  MCV 92.7 92.9 93.7  MCH 28.9 29.4 29.2  MCHC 31.1 31.6 31.1  RDW 13.4 13.5 13.6  PLT 291 276 273    Cardiac Enzymes Recent Labs Lab  12/15/16 0131 12/15/16 0700 12/15/16 1410  TROPONINI 0.05* 0.04* 0.03*    Recent Labs Lab 12/14/16 2052  TROPIPOC 0.05     BNP Recent Labs Lab 12/14/16 2026  BNP 919.4*     DDimer No results for input(s): DDIMER in the last 168 hours.   Radiology    Dg Chest Port 1 View  Result Date: 12/14/2016 CLINICAL DATA:  Palpitations and dizziness. Recent placement of dual-chamber pacemaker for bradycardia on 12/03/2016. EXAM: PORTABLE CHEST 1 VIEW COMPARISON:  12/04/2016 FINDINGS: Radiographic appearance of the dual-chamber pacemaker is stable. The heart is moderately enlarged. Based on portable technique today, overall heart size is felt to likely be stable despite appearing slightly more prominent. There likely is a component of left pleural fluid which may be slightly increased compared to the prior study. No pneumothorax or pulmonary edema evident. IMPRESSION: Stable appearance of pacemaker. Moderate cardiac enlargement which is felt to be most likely stable. There may be some enlargement of a left pleural effusion. Electronically Signed   By: Aletta Edouard M.D.   On: 12/14/2016 20:17     Patient Profile     83 year old female with h/o chronic combined systolic and diastolic CHF, LVEF 10-30%,  chronic stage IV CKD with atrophy of the left kidney (absent blood flow by renal artery doppler, Dr. Gwenlyn Found), HTN, CRI (III), HLD, PAF with tachybrady syndrome, s/p PTVDP-St Jude- implanted 12/03/16, Amiodarone started. On chronic warfarin therapy. She was admitted to [facility] 11/30/16-12/07/16 with increasing DOE for a couple of weeks and felt to be fluid overloaded with diastolic CHF exacerbation.  Assessment & Plan    1. Paroxysmal atrial fibrillation flutter: She had had symptomatic palpitations this admission and her pacemaker showed atrial flutter 80-130 rate. She converted back into sinus rhythm, remains in SR, she is on amiodarone and carvedilol. CHA2DS2-VASc Scoreat least 5. INR was supratherapeutic recently, 6.9 on 7/27. Down to 2.12 on 7/30. --  Continue Amiodarone (200mg  BID) and coumadin -- I have increased carvedilol to 25 mg po BID as she has PM  2.  Acute on chronic combined systolic and diastolic CHF/NICM:  2-D echo 12/02/16: LVEF 35-40 percent. -- Continue lasix 80 mg IV lasix BID, diuresing well, -900 ml output  -- Creatinine is 2.01->1.7 today, was 2.54 at recent discharge.  -- not on ACEI/ARB sec to CKD, I will add imdur to hydralazine  3. Tachybrady syndrome: s/p PPM, interrogation showed episode of atrial flutter 80-130s  4. Hypertension - I will add imdur 30 mg po daily to her regimen.  Signed, Ena Dawley, MD  12/16/2016, 1:00 PM

## 2016-12-16 NOTE — Progress Notes (Addendum)
Valerie CONSULT NOTE - follow up Pharmacy Consult for Coumadin Indication: atrial fibrillation  Allergies  Allergen Reactions  . Clonidine Derivatives Palpitations and Other (See Comments)    Very sedated  . Codeine Nausea Only  . Nickel Rash  . Sulfa Antibiotics Other (See Comments)    Reaction unknown    Patient Measurements: Height: 4\' 11"  (149.9 cm) Weight: 106 lb (48.1 kg) IBW/kg (Calculated) : 43.2  Vital Signs: Temp: 98.8 F (37.1 C) (08/01 0500) Temp Source: Oral (08/01 0500) BP: 173/69 (08/01 0750) Pulse Rate: 66 (08/01 0750)  Labs:  Recent Labs  12/14/16 2026 12/15/16 0131 12/15/16 0455 12/15/16 0700 12/15/16 1410 12/15/16 1458 12/16/16 0246  HGB 9.9*  --  9.9*  --   --   --  9.3*  HCT 31.8*  --  31.3*  --   --   --  29.9*  PLT 291  --  276  --   --   --  273  LABPROT 24.0*  --   --   --   --  22.3* 20.6*  INR 2.12  --   --   --   --  1.93 1.74  CREATININE 2.07*  --  2.01*  --   --   --  1.79*  TROPONINI  --  0.05*  --  0.04* 0.03*  --   --     Estimated Creatinine Clearance: 16.8 mL/min (A) (by C-G formula based on SCr of 1.79 mg/dL (H)).   Medical History: Past Medical History:  Diagnosis Date  . Atrophy of left kidney    with absent blood flow by renal artery dopplers (Dr. Gwenlyn Found)  . 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 Chronic renal insufficiency, stage 4 (severe) (HCC)    Baseline GFR 25 ml/min-- renal u/s showed atrophic/hypoplastic left kidney.  No hydronephrosis.  Small right renal cyst.  . History of adenomatous polyp of colon   . History of subarachnoid hemorrhage 10/2014   after syncope and while on xarelto  . Hyperlipidemia   . Hypertension    Difficult to control, in the setting of one functioning kidney: pt was referred to nephrology by Dr. Gwenlyn Found 06/2015.  . Lumbar radiculopathy 2012  . Lumbar spondylosis    MR 07/2016---no sign of spinal nerve compression or cord compression.  Pt set up with outpt ortho  while admitted to hosp 07/2016.  . Metatarsal fracture 06/10/2016   Nondisplaced, left 5th metatarsal--pt was referred to ortho  . Nonischemic cardiomyopathy ([place])   . Osteopenia 2014   T-score -2.1  . PAF (paroxysmal atrial fibrillation) (HCC)    Eliquis started after BRAO and CVA  . Pulmonary edema 11/2016  . Stroke [facility])    cardioembolic (had CVA while on no anticoag)--"scattered subacute punctate infarcts: 1 in R parietal lobe and 2 in occipital cortex" on MRI br.  CT angio head/neck: aortic arch athero.   . Thoracic compression fracture ([place]) 07/2016   T7 and T8-- T8 kyphoplasty during hosp admission 07/2016.  Neuro referred pt to pain mgmt for consideration of injection 09/2016.  I referred her to endo 08/2016 for consideration of calcitonin treatment.    Assessment: 83yo female had pacer placed ~2wk ago on 7/19, c/o episodes of palpitations and dizziness, admitted for telemetry, to continue Coumadin for Afib;  On 7/30 admit INR at goal with last dose of Coumadin taken 7/26 due to supratherapeutic INR (6.9) at outpt anticoag clinic 7/27.----on amiodarone which was started on 7/21.  Anti-coag  visit on 7/30  INR =2.5  (dose held 7/27, 28th, 29th)   Patient was to resume Coumadin at lowe dose of 1.5mg  daily as of 7/30 but hadn't taken prior to arrival to ED on 7/30.   Coumadin resumed on 7/31 per pharmacy consult- we resumed coumadin at the previously planned  PTA dose, gave 1.5 mg last night. Today the INR is 1.74. I expect INR will increase toward goal INR at lower dose due to drug interaction with amiodarone.    Review of Valerie history:  Note h/o SAH after syncope and while pt on Xarelto.   Pt had been on Elquis until recently, stopped 7/17d/t worsening renal function, switched on 7/19 to Coumadin during recent hospitalization (11/30/16 -12/07/16) for PPM implanted on 7/19. INR was therapeutic on discharge 7/23 on 3 mg daily + Amiodarone 200mg  po BID (new start, started 7/21.   Coumadin doses: (7/19-7/22/18) 2.5mg -2.5mg  -(amiod started 7/21) --5mg -2.5mg  INR 7/20  thru 7/23= 1.14> 1.13>1.62>2.44   -discharged 7/23 on 3mg  daily + amio 200mg  bid, then 4 days later on 7/27, INR supratherapeutic = 6.9    Currently Amiodarone 200mg  BID continues,  INR 2.12>1.93>1.74 today,  Coumadin held x3 days, resumed yesterday 7/31 at lower dose 1.5 mg daily. Today the INR is 1.74. I expect INR will increase toward goal INR at lower dose due to drug interaction with amiodarone. H/H low stable and pltc wnl. No bleeding reported.   Goal of Therapy:  INR 2-3   Plan:  Coumadin 1.5 mg po daily Daily INR.  Thank you for allowing pharmacy to be part of this patients care team. Nicole Cella, [place] Clinical Pharmacist Pager: [phone] 8A-4P [phone] 4P-10P [phone] [place] [phone] 12/16/2016,10:00 AM

## 2016-12-16 NOTE — Evaluation (Signed)
Occupational Therapy Evaluation Patient Details Name: Valerie Terrell MRN: [id number] DOB: [birth year] Today's Date: 12/16/2016    History of Present Illness  Valerie Terrell is a 83 y.o. female admitted with lightheadedness and palpitations.  Pt recently d/c from hospital after undergoing pacemaker placement on 12/03/16. She has medical history significant of left kidney atrophy, retinal artery occlusion in the left eye, stage IV chronic renal insufficiency, history of subarachnoid hemorrhage, hyperlipidemia, hypertension, lumbar spondylosis, paroxysmal atrial fibrillation.   Clinical Impression   Pt admitted with above. She demonstrates the below listed deficits and will benefit from continued OT to maximize safety and independence with BADLs.  Pt presents to OT with generalized weakness, decreased activity tolerance, impaired balance (with h/o falls).  Pt fatigues quite rapidly with activity, and demonstrates self limiting behaviors.  She requires min - mod A for ADLs, and min guard assist for functional mobility.  Recommend HHOT.       Follow Up Recommendations  Home health OT    Equipment Recommendations  None recommended by OT    Recommendations for Other Services       Precautions / Restrictions Precautions Precautions: None      Mobility Bed Mobility Overal bed mobility: Needs Assistance         Sit to supine: Min assist   General bed mobility comments: assist to lift LEs onto bed   Transfers Overall transfer level: Needs assistance Equipment used: Rolling walker (2 wheeled) Transfers: Sit to/from [employer] Transfers Sit to Stand: Min guard Stand pivot transfers: Min guard       General transfer comment: min guard for balance     Balance Overall balance assessment: Needs assistance Sitting-balance support: No upper extremity supported;Feet supported Sitting balance-Leahy Scale: Good     Standing balance support: Bilateral upper extremity  supported Standing balance-Leahy Scale: Poor                             ADL either performed or assessed with clinical judgement   ADL Overall ADL's : Needs assistance/impaired Eating/Feeding: Independent;Bed level   Grooming: Wash/dry hands;Wash/dry face;Oral care;Brushing hair;Set up;Sitting Grooming Details (indicate cue type and reason): too fatigued to perform in standing  Upper Body Bathing: Minimal assistance;Sitting   Lower Body Bathing: Minimal assistance;Sit to/from stand   Upper Body Dressing : Set up   Lower Body Dressing: Moderate assistance;Sit to/from stand   Toilet Transfer: Min guard;Stand-pivot;BSC;RW   Toileting- Water quality scientist and Hygiene: Minimal assistance;Sit to/from stand       Functional mobility during ADLs: Min guard;Rolling walker General ADL Comments: Pt fatigues quickly with activity.  Self limits.       Vision         Perception     Praxis      Pertinent Vitals/Pain Pain Assessment: Faces Faces Pain Scale: Hurts little more Pain Location: back/buttocks  Pain Descriptors / Indicators: Grimacing Pain Intervention(s): Monitored during session;Patient requesting pain meds-RN notified     Hand Dominance Right   Extremity/Trunk Assessment Upper Extremity Assessment Upper Extremity Assessment: Generalized weakness   Lower Extremity Assessment Lower Extremity Assessment: Defer to PT evaluation   Cervical / Trunk Assessment Cervical / Trunk Assessment: Kyphotic   Communication Communication Communication: No difficulties   Cognition Arousal/Alertness: Awake/alert Behavior During Therapy: WFL for tasks assessed/performed Overall Cognitive Status: Within Functional Limits for tasks assessed  General Comments  02 sats decreased to 87% on RA (pt was requesting to attepmt activity off of 02).  02 reapplied with sats improving to mid 90s     Exercises      Shoulder Instructions      Home Living Family/patient expects to be discharged to:: Private residence Living Arrangements: Children Available Help at Discharge: Family;Available 24 hours/day Type of Home: House Home Access: Stairs to enter [employer] of Steps: 3-4 Entrance Stairs-Rails: None Home Layout: One level     Bathroom Shower/Tub: Teacher, early years/pre: Handicapped height     Home Equipment: Environmental consultant - 2 wheels;Cane - quad;Bedside commode   Additional Comments: Pt had 1 session of HHPT      Prior Functioning/Environment Level of Independence: Needs assistance  Gait / Transfers Assistance Needed: ambulating with cane and RW since d/c last week ADL's / Homemaking Assistance Needed: Pt reports she bathes intermittently, and requires intermittent assist for LB ADLs    Comments: Pt reports she fatigues quickly with activity, and frequently takes naps         OT Problem List: Decreased strength;Decreased activity tolerance;Impaired balance (sitting and/or standing);Decreased safety awareness;Decreased knowledge of use of DME or AE;Cardiopulmonary status limiting activity      OT Treatment/Interventions: Self-care/ADL training;DME and/or AE instruction;Therapeutic activities;Patient/family education;Balance training;Energy conservation    OT Goals(Current goals can be found in the care plan section) Acute Rehab OT Goals Patient Stated Goal: to go home  OT Goal Formulation: With patient Time For Goal Achievement: 12/30/16 Potential to Achieve Goals: Good ADL Goals Pt Will Perform Grooming: with min guard assist;standing Pt Will Perform Lower Body Bathing: with min guard assist;sit to/from stand Pt Will Perform Upper Body Dressing: with supervision;with set-up;sitting Pt Will Perform Lower Body Dressing: with min guard assist;sit to/from stand Pt Will Transfer to Toilet: with min guard assist;ambulating;regular height toilet;bedside commode;grab  bars Pt Will Perform Toileting - Clothing Manipulation and hygiene: with min guard assist;sit to/from stand Additional ADL Goal #1: Pt will incorporate energy conservation techniques with min cues   OT Frequency: Min 2X/week   Barriers to D/C:            Co-evaluation              AM-PAC PT "6 Clicks" Daily Activity     Outcome Measure Help from another person eating meals?: None Help from another person taking care of personal grooming?: A Little Help from another person toileting, which includes using toliet, bedpan, or urinal?: A Little Help from another person bathing (including washing, rinsing, drying)?: A Lot Help from another person to put on and taking off regular upper body clothing?: A Little Help from another person to put on and taking off regular lower body clothing?: A Lot 6 Click Score: 17   End of Session Equipment Utilized During Treatment: Rolling walker;Oxygen Nurse Communication: Mobility status  Activity Tolerance: Patient limited by fatigue Patient left: in bed;with call bell/phone within reach;with bed alarm set  OT Visit Diagnosis: Unsteadiness on feet (R26.81);Muscle weakness (generalized) (M62.81)                Time: 8588-5027 OT Time Calculation (min): 37 min Charges:  OT General Charges $OT Visit: 1 Procedure OT Evaluation $OT Eval Moderate Complexity: 1 Procedure OT Treatments $Therapeutic Activity: 8-22 mins G-Codes:     [facility], OTR/L [phone]   Lucille Passy M 12/16/2016, 5:43 PM

## 2016-12-16 NOTE — Progress Notes (Signed)
PROGRESS NOTE    Valerie Terrell  [id number]:[id number] DOB: [birth year] DOA: 12/14/2016 PCP: Tammi Sou, MD   Brief Narrative:  83 year old female, her daughter lives with her, has a cane and walker at home, PMH of stage IV chronic kidney disease/left kidney atrophy, HLD, HTN, NICM, PAF, embolic stroke, subarachnoid hemorrhage, recent hospitalization 11/30/16-12/07/16 4 acute hypoxic respiratory.  Assessment & Plan:   Active Problems:   Essential hypertension - coreg, hydralazine, imdur, hydralazine. - will continue to monitor    CKD (chronic kidney disease), stage II - S creatinine trending down on last check.    Anemia - will continue to monitor    Acute combined systolic and diastolic heart failure (HCC)  Nonischemic cardiomyopathy ([place]): EF 35-40% - possibly related to A. Fib - Cardiology on board and assisting.  - Not on ACEI/ARB sec to CKD, plan is for cardiology added imdur and hydralazine - Pt on lasix 80 mg IV lasix bid    Atrial fibrillation ([place]) [I48.91] - CHADsVasc score of atleast 5 per cardiology notes. Coumadin - on Amiodarone.  - Pt had carvedilol increased to 25 mg po bid - Pt on lasix 80 mg IV lasix bid    Hypokalemia - resolved on last check   DVT prophylaxis: coumadin Code Status: Full Family Communication: d/c patient directly and family member at bedside Disposition Plan: pending improvement in condition   Consultants:   Cardiology   Procedures: none   Antimicrobials: None   Subjective: Pt has no new complaints, feeling better  Objective: Vitals:   12/15/16 2357 12/16/16 0500 12/16/16 0750 12/16/16 1221  BP: (!) 151/49 (!) 160/57 (!) 173/69 (!) 161/48  Pulse: 64 65 66 65  Resp: 20 20 18 18   Temp: 97.8 F (36.6 C) 98.8 F (37.1 C)  98.6 F (37 C)  TempSrc: Oral Oral  Oral  SpO2: 96% 98% 99% 96%  Weight:  48.1 kg (106 lb)    Height:        Intake/Output Summary (Last 24 hours) at 12/16/16 1309 Last data filed at 12/16/16  0644  Gross per 24 hour  Intake                0 ml  Output              800 ml  Net             -800 ml   Filed Weights   12/14/16 1927 12/15/16 1439 12/16/16 0500  Weight: 48.1 kg (106 lb) 47.8 kg (105 lb 6.1 oz) 48.1 kg (106 lb)    Examination:  General exam: Appears calm and comfortable, in nad Respiratory system: equal chest rise, no wheezes, decreased breath sounds at bases Cardiovascular system: S1 & S2 heard,  No JVD, murmurs, rubs, gallops or clicks.  pedal edema. Gastrointestinal system: Abdomen is nondistended, soft and nontender. No organomegaly or masses felt. Normal bowel sounds heard. Central nervous system: Alert and oriented. No focal neurological deficits. Extremities: Symmetric 5 x 5 power. Skin: No rashes, lesions or ulcers, on limited exam. Psychiatry:  Mood & affect appropriate.    Data Reviewed: I have personally reviewed following labs and imaging studies  CBC:  Recent Labs Lab 12/14/16 2026 12/15/16 0455 12/16/16 0246  WBC 13.1* 10.3 9.4  HGB 9.9* 9.9* 9.3*  HCT 31.8* 31.3* 29.9*  MCV 92.7 92.9 93.7  PLT 291 276 448   Basic Metabolic Panel:  Recent Labs Lab 12/14/16 2026 12/15/16 0455 12/16/16 0246  NA 135  135 136  K 2.9* 3.4* 3.8  CL 89* 90* 94*  CO2 32 36* 35*  GLUCOSE 96 112* 106*  BUN 66* 60* 50*  CREATININE 2.07* 2.01* 1.79*  CALCIUM 8.5* 8.2* 8.4*  MG  --  1.9  --   PHOS  --  2.2*  --    GFR: Estimated Creatinine Clearance: 16.8 mL/min (A) (by C-G formula based on SCr of 1.79 mg/dL (H)). Liver Function Tests:  Recent Labs Lab 12/15/16 0455  AST 34  ALT 12*  ALKPHOS 50  BILITOT 0.3  PROT 5.6*  ALBUMIN 2.5*   No results for input(s): LIPASE, AMYLASE in the last 168 hours. No results for input(s): AMMONIA in the last 168 hours. Coagulation Profile:  Recent Labs Lab 12/11/16 1625 12/14/16 12/14/16 2026 12/15/16 1458 12/16/16 0246  INR 6.9* 2.5* 2.12 1.93 1.74   Cardiac Enzymes:  Recent Labs Lab  12/15/16 0131 12/15/16 0700 12/15/16 1410  TROPONINI 0.05* 0.04* 0.03*   BNP (last 3 results) No results for input(s): PROBNP in the last 8760 hours. HbA1C:  Recent Labs  12/15/16 0455  HGBA1C 5.2   CBG: No results for input(s): GLUCAP in the last 168 hours. Lipid Profile: No results for input(s): CHOL, HDL, LDLCALC, TRIG, CHOLHDL, LDLDIRECT in the last 72 hours. Thyroid Function Tests:  Recent Labs  12/15/16 0455  TSH 3.563   Anemia Panel: No results for input(s): VITAMINB12, FOLATE, FERRITIN, TIBC, IRON, RETICCTPCT in the last 72 hours. Sepsis Labs: No results for input(s): PROCALCITON, LATICACIDVEN in the last 168 hours.  No results found for this or any previous visit (from the past 240 hour(s)).       Radiology Studies: Dg Chest Port 1 View  Result Date: 12/14/2016 CLINICAL DATA:  Palpitations and dizziness. Recent placement of dual-chamber pacemaker for bradycardia on 12/03/2016. EXAM: PORTABLE CHEST 1 VIEW COMPARISON:  12/04/2016 FINDINGS: Radiographic appearance of the dual-chamber pacemaker is stable. The heart is moderately enlarged. Based on portable technique today, overall heart size is felt to likely be stable despite appearing slightly more prominent. There likely is a component of left pleural fluid which may be slightly increased compared to the prior study. No pneumothorax or pulmonary edema evident. IMPRESSION: Stable appearance of pacemaker. Moderate cardiac enlargement which is felt to be most likely stable. There may be some enlargement of a left pleural effusion. Electronically Signed   By: Aletta Edouard M.D.   On: 12/14/2016 20:17        Scheduled Meds: . amiodarone  200 mg Oral BID  . carvedilol  25 mg Oral BID WC  . feeding supplement (ENSURE ENLIVE)  237 mL Oral BID BM  . furosemide  80 mg Intravenous BID  . hydrALAZINE  100 mg Oral TID  . isosorbide mononitrate  30 mg Oral Daily  . potassium chloride  20 mEq Oral BID  . pravastatin   40 mg Oral QPM  . sodium chloride flush  3 mL Intravenous Q12H  . sodium chloride flush  3 mL Intravenous Q12H  . warfarin  1.5 mg Oral q1800  . Warfarin - Pharmacist Dosing Inpatient   Does not apply q1800   Continuous Infusions: . sodium chloride       LOS: 1 day    Time spent: > 35 minutes  Velvet Bathe, MD [facility] Pager [phone]  If 7PM-7AM, please contact night-coverage [email or web address] Password [email or web address] 12/16/2016, 1:09 PM

## 2016-12-16 NOTE — Consult Note (Signed)
   [facility] CM Inpatient Consult   12/16/2016  Valerie Terrell [birth year] [id number]   Made aware of patient's admission.  Patient is currently active with [place] [facility] for chronic disease management services in the [place].  Patient has been engaged by a [employer].  Our community based plan of care has focused on disease management and community resource support.  Patient will receive a post discharge transition of care call and will be evaluated for monthly home visits for assessments and disease process education.  Made Inpatient Case Manager aware that [facility] following. Of note, [facility] Care Management services does not replace or interfere with any services that are needed or arranged by inpatient case management or social work.  For additional questions or referrals please contact:  Natividad Brood, RN BSN [facility] Liaison  [phone] business mobile phone Toll free office [phone]

## 2016-12-17 LAB — PROTIME-INR
INR: 1.45
Prothrombin Time: 17.8 seconds — ABNORMAL HIGH (ref 11.4–15.2)

## 2016-12-17 MED ORDER — CARVEDILOL 25 MG PO TABS: 25.0000 mg | ORAL_TABLET | Freq: Two times a day (BID) | ORAL | 0 refills | Status: DC

## 2016-12-17 MED ORDER — COUMADIN BOOK
Freq: Once | Status: DC
  Administered 2016-12-17: 14:00:00
  Filled 2016-12-17: qty 1

## 2016-12-17 MED ORDER — POTASSIUM CHLORIDE ER 20 MEQ PO TBCR: 20.0000 meq | EXTENDED_RELEASE_TABLET | Freq: Two times a day (BID) | ORAL | 0 refills | Status: DC

## 2016-12-17 MED ORDER — ISOSORBIDE MONONITRATE ER 60 MG PO TB24
60.0000 mg | ORAL_TABLET | Freq: Every day | ORAL | Status: DC
  Filled 2016-12-17: qty 1

## 2016-12-17 MED ORDER — FUROSEMIDE 80 MG PO TABS: 160.0000 mg | ORAL_TABLET | Freq: Every day | ORAL | Status: DC

## 2016-12-17 MED ORDER — COUMADIN BOOK
Freq: Once | Status: AC
  Administered 2016-12-17: 14:00:00
  Filled 2016-12-17: qty 1

## 2016-12-17 MED ORDER — ISOSORBIDE MONONITRATE ER 60 MG PO TB24: 60.0000 mg | ORAL_TABLET | Freq: Every day | ORAL | 0 refills | Status: DC

## 2016-12-17 NOTE — Plan of Care (Signed)
Problem: Cardiac: Goal: Ability to achieve and maintain adequate cardiopulmonary perfusion will improve Outcome: Progressing Patient verbalized understanding  Problem: Education: Goal: Ability to demonstrate managment of disease process will improve Outcome: Progressing Patient verbalized understanding Goal: Ability to verbalize understanding of medication therapies will improve Outcome: Progressing Patient verbalized understanding  Problem: Health Behavior/Discharge Planning: Goal: Ability to manage health-related needs will improve for discharge Outcome: Progressing Patient verbalized understanding

## 2016-12-17 NOTE — Progress Notes (Addendum)
Progress Note  Patient Name: Valerie Terrell Date of Encounter: 12/17/2016  Primary Cardiologist: Dr Gwenlyn Found  Subjective   She feels better today, improved SOB. Still some with walking but at her baseline, she wishes to go home.  Inpatient Medications    Scheduled Meds: . amiodarone  200 mg Oral BID  . carvedilol  25 mg Oral BID WC  . feeding supplement (ENSURE ENLIVE)  237 mL Oral BID BM  . furosemide  80 mg Intravenous BID  . hydrALAZINE  100 mg Oral TID  . isosorbide mononitrate  30 mg Oral Daily  . potassium chloride  20 mEq Oral BID  . pravastatin  40 mg Oral QPM  . sodium chloride flush  3 mL Intravenous Q12H  . sodium chloride flush  3 mL Intravenous Q12H  . warfarin  1.5 mg Oral q1800  . Warfarin - Pharmacist Dosing Inpatient   Does not apply q1800   Continuous Infusions: . sodium chloride     PRN Meds: sodium chloride, acetaminophen **OR** acetaminophen, ondansetron **OR** ondansetron (ZOFRAN) IV, oxyCODONE-acetaminophen, sodium chloride flush   Vital Signs    Vitals:   12/16/16 1937 12/16/16 2300 12/16/16 2350 12/17/16 0553  BP: (!) 162/53 (!) 162/53 (!) 129/47 (!) 184/72  Pulse: 67  63 69  Resp: 18  20 20   Temp: 98.4 F (36.9 C)  98.4 F (36.9 C) 98.5 F (36.9 C)  TempSrc: Oral  Oral Oral  SpO2: 98%  98% 95%  Weight:    109 lb 5.6 oz (49.6 kg)  Height:        Intake/Output Summary (Last 24 hours) at 12/17/16 1045 Last data filed at 12/17/16 0557  Gross per 24 hour  Intake              603 ml  Output             1100 ml  Net             -497 ml   Filed Weights   12/15/16 1439 12/16/16 0500 12/17/16 0553  Weight: 105 lb 6.1 oz (47.8 kg) 106 lb (48.1 kg) 109 lb 5.6 oz (49.6 kg)    Telemetry    SR - Personally Reviewed  Physical Exam   GEN: No acute distress.   Neck: No JVD Cardiac: RRR, no murmurs, rubs, or gallops.  Respiratory: Clear to auscultation bilaterally. GI: Soft, nontender, non-distended  MS: mild nonpitting B/L edema; No  deformity. Neuro:  Nonfocal  Psych: Normal affect   Labs    Chemistry  Recent Labs Lab 12/14/16 2026 12/15/16 0455 12/16/16 0246  NA 135 135 136  K 2.9* 3.4* 3.8  CL 89* 90* 94*  CO2 32 36* 35*  GLUCOSE 96 112* 106*  BUN 66* 60* 50*  CREATININE 2.07* 2.01* 1.79*  CALCIUM 8.5* 8.2* 8.4*  PROT  --  5.6*  --   ALBUMIN  --  2.5*  --   AST  --  34  --   ALT  --  12*  --   ALKPHOS  --  50  --   BILITOT  --  0.3  --   GFRNONAA 21* 22* 25*  GFRAA 25* 26* 29*  ANIONGAP 14 9 7      Hematology  Recent Labs Lab 12/14/16 2026 12/15/16 0455 12/16/16 0246  WBC 13.1* 10.3 9.4  RBC 3.43* 3.37* 3.19*  HGB 9.9* 9.9* 9.3*  HCT 31.8* 31.3* 29.9*  MCV 92.7 92.9 93.7  MCH 28.9 29.4 29.2  MCHC 31.1 31.6 31.1  RDW 13.4 13.5 13.6  PLT 291 276 273    Cardiac Enzymes  Recent Labs Lab 12/15/16 0131 12/15/16 0700 12/15/16 1410  TROPONINI 0.05* 0.04* 0.03*     Recent Labs Lab 12/14/16 2052  TROPIPOC 0.05     BNP  Recent Labs Lab 12/14/16 2026  BNP 919.4*     DDimer No results for input(s): DDIMER in the last 168 hours.   Radiology    No results found.   Patient Profile     83 year old female with h/o chronic combined systolic and diastolic CHF, LVEF 09-81%, chronic stage IV CKD with atrophy of the left kidney (absent blood flow by renal artery doppler, Dr. Gwenlyn Found), HTN, CRI (III), HLD, PAF with tachybrady syndrome, s/p PTVDP-[employer]- implanted 12/03/16, Amiodarone started. On chronic warfarin therapy. She was admitted to [facility] 11/30/16-12/07/16 with increasing DOE for a couple of weeks and felt to be fluid overloaded with diastolic CHF exacerbation.  Assessment & Plan    1. Paroxysmal atrial fibrillation flutter: She had had symptomatic palpitations this admission and her pacemaker showed atrial flutter 80-130 rate. She converted back into sinus rhythm, remains in SR, she is on amiodarone and carvedilol. CHA2DS2-VASc Scoreat least 5. INR was supratherapeutic  recently, 6.9 on 7/27. Down to 2.12 on 7/30. --  Continue Amiodarone (200mg  BID) and coumadin -- I have increased carvedilol to 25 mg po BID as she has PM  2.  Acute on chronic combined systolic and diastolic CHF/NICM:  2-D echo 12/02/16: LVEF 35-40 percent. -- she diuresed 1.5 L in 48 hours,  - switch to home dose lasix 160 mg po BID - crea has improved to long time minimum 1.79  - not on ACEI/ARB sec to CKD, I will add imdur to hydralazine  3. Tachybrady syndrome: s/p PPM, interrogation showed episode of atrial flutter 80-130s  4. Hypertension - I will increase Imdur to 60 mg po daily to her regimen.  She is stable for a discharge from cardiac standpoint, we will arrange for an outpatient follow up  Signed, Ena Dawley, MD  12/17/2016, 10:45 AM

## 2016-12-17 NOTE — Discharge Summary (Signed)
Physician Discharge Summary  Kourtni Stineman AUQ:[id number] DOB: 12-22-1932 DOA: 12/14/2016  PCP: Tammi Sou, MD  Admit date: 12/14/2016 Discharge date: 12/17/2016  Time spent: > 35 minutes  Recommendations for Outpatient Follow-up:  1. Monitor K levels 2. Monitor serum creatinine 3. Monitor hgb levels   Discharge Diagnoses:  Active Problems:   Essential hypertension   CKD (chronic kidney disease), stage II   Anemia   Cardiac pacemaker   Nonischemic cardiomyopathy [facility]): EF 35-40%   Acute combined systolic and diastolic heart failure (HCC) - possibly related to A. fib   Atrial fibrillation (HCC) [I48.91]   Hypokalemia   Leukocytosis   Palpitations   Discharge Condition: stable  Diet recommendation: heart healthy  Filed Weights   12/15/16 1439 12/16/16 0500 12/17/16 0553  Weight: 47.8 kg (105 lb 6.1 oz) 48.1 kg (106 lb) 49.6 kg (109 lb 5.6 oz)    History of present illness:  83 year old female, her daughter lives with her, has a cane and walker at home, PMH of stage IV chronic kidney disease/left kidney atrophy, HLD, HTN, NICM, PAF, embolic stroke, subarachnoid hemorrhage, recent hospitalization 11/30/16-12/07/16 for acute hypoxic respiratory failure.  Hospital Course:  Active Problems:   Essential hypertension - coreg, hydralazine, imdur, hydralazine. - stable    CKD (chronic kidney disease), stage II - S creatinine trending down on last check.    Anemia - will continue to monitor    Acute combined systolic and diastolic heart failure (HCC)  Nonischemic cardiomyopathy ([place]): EF 35-40% - possibly related to A. Fib - Cardiology on board and recommended the following: - she diuresed 1.5 L in 48 hrs - switched to home dose lasix 160 mg po BID - crea on last check 1.79  - not on ACEI/ARB sec to CKD, I will add imdur to hydralazine    Atrial fibrillation ([place]) [I48.91] - CHADsVasc score of atleast 5 per cardiology notes. Coumadin to be continued. Pt to check  2-3 days after discharge. - on Amiodarone pt to continue 200 mg po bid.  - Pt had carvedilol increased to 25 mg po bid    Hypokalemia - resolved on last check - write script for K replacement while on lasix.  Procedures:  None  Consultations:  Cardiology  Discharge Exam: Vitals:   12/17/16 0553 12/17/16 1112  BP: (!) 184/72 (!) 153/54  Pulse: 69 64  Resp: 20 18  Temp: 98.5 F (36.9 C) 98.1 F (36.7 C)    General: Pt in nad, alert and awake Cardiovascular: s1 and s2 present, no gallops Respiratory: no increased wob, no wheezes, [place] in place  Discharge Instructions   Discharge Instructions    Call MD for:  difficulty breathing, headache or visual disturbances    Complete by:  As directed    Call MD for:  extreme fatigue    Complete by:  As directed    Call MD for:  severe uncontrolled pain    Complete by:  As directed    Call MD for:  temperature >100.4    Complete by:  As directed    Diet - low sodium heart healthy    Complete by:  As directed    Discharge instructions    Complete by:  As directed    Please reassess your INR levels within the next 2-3 days after hospital discharge. Ensure follow up with your cardiologist.   Increase activity slowly    Complete by:  As directed      Current Discharge Medication List  START taking these medications   Details  isosorbide mononitrate (IMDUR) 60 MG 24 hr tablet Take 1 tablet (60 mg total) by mouth daily. Qty: 30 tablet, Refills: 0    potassium chloride 20 MEQ TBCR Take 20 mEq by mouth 2 (two) times daily. Qty: 30 tablet, Refills: 0      CONTINUE these medications which have CHANGED   Details  carvedilol (COREG) 25 MG tablet Take 1 tablet (25 mg total) by mouth 2 (two) times daily with a meal. Qty: 60 tablet, Refills: 0      CONTINUE these medications which have NOT CHANGED   Details  acetaminophen (TYLENOL) 500 MG tablet Take 500 mg by mouth every 6 (six) hours as needed for moderate pain.     amiodarone (PACERONE) 200 MG tablet Take 1 tablet (200 mg total) by mouth 2 (two) times daily. Qty: 60 tablet, Refills: 0    Calcium Carb-Cholecalciferol (CALCIUM/VITAMIN D PO) Take 1 tablet by mouth 2 (two) times daily.    feeding supplement, ENSURE ENLIVE, (ENSURE ENLIVE) LIQD Take 237 mLs by mouth 2 (two) times daily between meals. Qty: 237 mL, Refills: 12    fluticasone (CUTIVATE) 0.05 % cream Apply topically 2 (two) times daily. Qty: 30 g, Refills: 1    furosemide (LASIX) 80 MG tablet Take 2 tablets (160 mg total) by mouth 2 (two) times daily. Qty: 120 tablet, Refills: 0    hydrALAZINE (APRESOLINE) 100 MG tablet Take 1 tablet (100 mg total) by mouth 3 (three) times daily. Qty: 30 tablet, Refills: 0    ibandronate (BONIVA) 150 MG tablet Take 1 tablet (150 mg total) by mouth every 30 (thirty) days. Take in the morning with a full glass of water, on an empty stomach, and do not take anything else by mouth or lie down for the next 30 min. Qty: 3 tablet, Refills: 3    Multiple Vitamin (MULTIVITAMIN) tablet Take 1 tablet by mouth 3 (three) times a week.     ondansetron (ZOFRAN) 4 MG tablet Take 0.5-1 tablets (2-4 mg total) by mouth every 8 (eight) hours as needed for nausea or vomiting. Qty: 90 tablet, Refills: 3    oxyCODONE-acetaminophen (PERCOCET/ROXICET) 5-325 MG tablet Take 1-2 tablets by mouth every 6 (six) hours as needed for severe pain. Qty: 60 tablet, Refills: 0    pravastatin (PRAVACHOL) 40 MG tablet Take 40 mg by mouth every evening.    vitamin C (ASCORBIC ACID) 500 MG tablet Take 500 mg by mouth daily.    Vitamin D, Ergocalciferol, (DRISDOL) 50000 units CAPS capsule 1 cap po q week x 12 weeks Qty: 12 capsule, Refills: 1    warfarin (COUMADIN) 3 MG tablet Take 1 tablet (3 mg total) by mouth daily. Qty: 10 tablet, Refills: 0       Allergies  Allergen Reactions  . Clonidine Derivatives Palpitations and Other (See Comments)    Very sedated  . Codeine Nausea Only   . Nickel Rash  . Sulfa Antibiotics Other (See Comments)    Reaction unknown   Follow-up Information    Coumadin Clinic. Go on 12/25/2016.   Why:  Please go in next Friday at 3pm to have your coumadin level checked. Contact information: You had an appointment for the Coumadin Clinic scheduled for tomorrow. This has been changed. Please see new date and time.        Ahmed Prima, Fransisco Hertz, PA-C. Go on 01/05/2017.   Specialties:  Physician Assistant, Cardiology Why:  Your appointment is at 10:00am, please  arrive 15 minutes early.  you wer going to see Kerin Ransom, PA-C tomorrow but I have cancelled this appointment and changed it. Contact information: [address] [place] [address] [phone]            The results of significant diagnostics from this hospitalization (including imaging, microbiology, ancillary and laboratory) are listed below for reference.    Significant Diagnostic Studies: Dg Chest 2 View  Result Date: 12/04/2016 CLINICAL DATA:  Pacemaker placement EXAM: CHEST  2 VIEW COMPARISON:  11/30/2016 FINDINGS: The heart is mildly enlarged. Double lead left subclavian pacemaker device has been placed. Tips of the leads are in the right atrium and right ventricle. There is no ensuing pneumothorax. Patchy right lower lung zone opacities are stable. Left pleural effusion is small and stable. Linear opacities at the left base are stable. Osteopenia. Mid-level thoracic vertebroplasty changes. There are compression fractures in both vertebral bodies about the augmented mid-level thoracic vertebral body. These bony findings are stable. IMPRESSION: Left subclavian pacemaker device placement without complication or pneumothorax. Stable right basilar pulmonary opacities Stable left pleural effusion and basilar atelectasis for scarring. Electronically Signed   By: Marybelle Killings M.D.   On: 12/04/2016 07:50   Dg Chest 2 View  Result Date: 11/30/2016 CLINICAL DATA:  83  y/o  F; shortness of breath. EXAM: CHEST  2 VIEW COMPARISON:  08/05/2016 chest radiograph FINDINGS: Stable moderate cardiomegaly. Aortic atherosclerosis with calcification. Stable small left pleural effusion. Stable left lung base opacity. Interstitial pulmonary edema. Increased perihilar haziness probably represents developing alveolar edema. Midthoracic compression deformity post kyphoplasty. IMPRESSION: Interstitial and mild alveolar pulmonary edema. Small left pleural effusion with left basilar opacity probably representing associated atelectasis. Stable cardiomegaly. Electronically Signed   By: Kristine Garbe M.D.   On: 11/30/2016 17:57   Dg Chest Port 1 View  Result Date: 12/14/2016 CLINICAL DATA:  Palpitations and dizziness. Recent placement of dual-chamber pacemaker for bradycardia on 12/03/2016. EXAM: PORTABLE CHEST 1 VIEW COMPARISON:  12/04/2016 FINDINGS: Radiographic appearance of the dual-chamber pacemaker is stable. The heart is moderately enlarged. Based on portable technique today, overall heart size is felt to likely be stable despite appearing slightly more prominent. There likely is a component of left pleural fluid which may be slightly increased compared to the prior study. No pneumothorax or pulmonary edema evident. IMPRESSION: Stable appearance of pacemaker. Moderate cardiac enlargement which is felt to be most likely stable. There may be some enlargement of a left pleural effusion. Electronically Signed   By: Aletta Edouard M.D.   On: 12/14/2016 20:17    Microbiology: No results found for this or any previous visit (from the past 240 hour(s)).   Labs: Basic Metabolic Panel:  Recent Labs Lab 12/14/16 2026 12/15/16 0455 12/16/16 0246  NA 135 135 136  K 2.9* 3.4* 3.8  CL 89* 90* 94*  CO2 32 36* 35*  GLUCOSE 96 112* 106*  BUN 66* 60* 50*  CREATININE 2.07* 2.01* 1.79*  CALCIUM 8.5* 8.2* 8.4*  MG  --  1.9  --   PHOS  --  2.2*  --    Liver Function  Tests:  Recent Labs Lab 12/15/16 0455  AST 34  ALT 12*  ALKPHOS 50  BILITOT 0.3  PROT 5.6*  ALBUMIN 2.5*   No results for input(s): LIPASE, AMYLASE in the last 168 hours. No results for input(s): AMMONIA in the last 168 hours. CBC:  Recent Labs Lab 12/14/16 2026 12/15/16 0455 12/16/16 0246  WBC 13.1*  10.3 9.4  HGB 9.9* 9.9* 9.3*  HCT 31.8* 31.3* 29.9*  MCV 92.7 92.9 93.7  PLT 291 276 273   Cardiac Enzymes:  Recent Labs Lab 12/15/16 0131 12/15/16 0700 12/15/16 1410  TROPONINI 0.05* 0.04* 0.03*   BNP: BNP (last 3 results)  Recent Labs  07/22/16 1749 11/30/16 1331 12/14/16 2026  BNP 887.7* >4,500.0* 919.4*    ProBNP (last 3 results) No results for input(s): PROBNP in the last 8760 hours.  CBG: No results for input(s): GLUCAP in the last 168 hours.   Signed:  Velvet Bathe MD.  [facility] 12/17/2016, 12:37 PM

## 2016-12-17 NOTE — Consult Note (Signed)
   [facility] CM Inpatient Consult   12/17/2016  Valerie Terrell 12-12-1932 [id number]  Follow up:  Met with the patient and her daughter. Patient to discharge home with home health care.  Written consent in chart on file for [facility] services.  They endorse ongoing follow up.  For questions, please contact:  Natividad Brood, RN BSN [facility] Liaison  [phone] business mobile phone Toll free office [phone]

## 2016-12-17 NOTE — Progress Notes (Signed)
Pt was offered a bath and she refused the bath due to being d/c

## 2016-12-17 NOTE — Discharge Instructions (Signed)

## 2016-12-17 NOTE — Progress Notes (Signed)
ANTICOAGULATION CONSULT NOTE - follow up Pharmacy Consult for Coumadin Indication: atrial fibrillation  Allergies  Allergen Reactions  . Clonidine Derivatives Palpitations and Other (See Comments)    Very sedated  . Codeine Nausea Only  . Nickel Rash  . Sulfa Antibiotics Other (See Comments)    Reaction unknown    Patient Measurements: Height: 4\' 11"  (149.9 cm) Weight: 109 lb 5.6 oz (49.6 kg) (scale A) IBW/kg (Calculated) : 43.2  Vital Signs: Temp: 98.1 F (36.7 C) (08/02 1112) Temp Source: Oral (08/02 1112) BP: 153/54 (08/02 1112) Pulse Rate: 64 (08/02 1112)  Labs:  Recent Labs  12/14/16 2026 12/15/16 0131 12/15/16 0455 12/15/16 0700 12/15/16 1410 12/15/16 1458 12/16/16 0246 12/17/16 0329  HGB 9.9*  --  9.9*  --   --   --  9.3*  --   HCT 31.8*  --  31.3*  --   --   --  29.9*  --   PLT 291  --  276  --   --   --  273  --   LABPROT 24.0*  --   --   --   --  22.3* 20.6* 17.8*  INR 2.12  --   --   --   --  1.93 1.74 1.45  CREATININE 2.07*  --  2.01*  --   --   --  1.79*  --   TROPONINI  --  0.05*  --  0.04* 0.03*  --   --   --     Estimated Creatinine Clearance: 16.8 mL/min (A) (by C-G formula based on SCr of 1.79 mg/dL (H)).   Medical History: Past Medical History:  Diagnosis Date  . Atrophy of left kidney    with absent blood flow by renal artery dopplers (Dr. Gwenlyn Found)  . 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 Chronic renal insufficiency, stage 4 (severe) (HCC)    Baseline GFR 25 ml/min-- renal u/s showed atrophic/hypoplastic left kidney.  No hydronephrosis.  Small right renal cyst.  . History of adenomatous polyp of colon   . History of subarachnoid hemorrhage 10/2014   after syncope and while on xarelto  . Hyperlipidemia   . Hypertension    Difficult to control, in the setting of one functioning kidney: pt was referred to nephrology by Dr. Gwenlyn Found 06/2015.  . Lumbar radiculopathy 2012  . Lumbar spondylosis    MR 07/2016---no sign of spinal  nerve compression or cord compression.  Pt set up with outpt ortho while admitted to hosp 07/2016.  . Metatarsal fracture 06/10/2016   Nondisplaced, left 5th metatarsal--pt was referred to ortho  . Nonischemic cardiomyopathy ([place])   . Osteopenia 2014   T-score -2.1  . PAF (paroxysmal atrial fibrillation) (HCC)    Eliquis started after BRAO and CVA  . Pulmonary edema 11/2016  . Stroke [facility])    cardioembolic (had CVA while on no anticoag)--"scattered subacute punctate infarcts: 1 in R parietal lobe and 2 in occipital cortex" on MRI br.  CT angio head/neck: aortic arch athero.   . Thoracic compression fracture ([place]) 07/2016   T7 and T8-- T8 kyphoplasty during hosp admission 07/2016.  Neuro referred pt to pain mgmt for consideration of injection 09/2016.  I referred her to endo 08/2016 for consideration of calcitonin treatment.    Assessment: 83yo female had pacer placed ~2wk ago on 7/19, admitted on 12/14/16 with c/o episodes of palpitations and dizziness, admitted for telemetry.Continuing chronic Coumadin for Afib;  On 7/30 admit INR at  goal with last dose of Coumadin taken 7/26 due to supratherapeutic INR (6.9) at outpt anticoag clinic 7/27.----Also recently started on amiodarone which was started on 7/21.  7/27 Anti-coag visit: INR = 6.9 on 3mg  daily (likely due to amiodarone which was recently started on 7/21 at previous hospital admission (11/30/16 -12/07/16)-pt instructed to hold coumadin& check INR 7/30 7/30 Anti-coag visit: INR =2.5 , instructed to resume at lower dose 1.5mg  daily.  Later patient presented to [facility], admitted,  hadn't taken coumadin prior to arrival to ED.   Coumadin resumed on 7/31 at 1.5 mg daily INR = 1.45 , trend  2.12>1.93>1.74>1.45  INR has decreased since admission after coumadin held x 4 days(7/27-7/30), she has received 2 days of 1.5mg  daily so far. Drug interaction with amiodarone/coumadin will increase coumadin effect.   H/H low stable and pltc wnl on 8/1. No bleeding  reported.   Review of anticoagulation history:  Note h/o SAH after syncope and while pt on Xarelto.   Pt had been on Elquis until recently, stopped 7/17d/t worsening renal function, switched on 7/19 to Coumadin during recent hospitalization (11/30/16 -12/07/16) for PPM implanted on 7/19.   Goal of Therapy:  INR 2-3   Plan:  Continue Coumadin 1.5mg  daily , anticipating INR to increase soon and DDI with amiodarone's effect. Daily INR. If discharged today, I recommend coumadin 1.5 mg daily and check INR tomorrow Fri 8/3. Prefer INR check 8/3 due to recent supratherapeutic INR likely secondary to amiodarone drug interaction.   Thank you for allowing pharmacy to be part of this patients care team. Nicole Cella, [place] Clinical Pharmacist Pager: [phone]-4P [phone]-10P [phone] Main Pharmacy [phone] 12/17/2016,11:43 AM

## 2016-12-17 NOTE — Progress Notes (Signed)
Patient is alert and oriented, daughter at bedside, discharge instructions reviewed with patient and daughter, patient to follow up next week to have coumadin level checked, prescription given to patient, questions and concerns answered Neta Mends RN 2:19 PM 12-17-2016

## 2016-12-18 ENCOUNTER — Ambulatory Visit (INDEPENDENT_AMBULATORY_CARE_PROVIDER_SITE_OTHER): Payer: Self-pay | Admitting: Pharmacist Clinician (PhC)/ Clinical Pharmacy Specialist

## 2016-12-18 ENCOUNTER — Other Ambulatory Visit: Payer: Self-pay

## 2016-12-18 ENCOUNTER — Ambulatory Visit: Payer: Medicare Other | Admitting: Cardiology

## 2016-12-18 DIAGNOSIS — N183 Chronic kidney disease, stage 3 (moderate): Secondary | ICD-10-CM | POA: Diagnosis not present

## 2016-12-18 DIAGNOSIS — Z79891 Long term (current) use of opiate analgesic: Secondary | ICD-10-CM | POA: Diagnosis not present

## 2016-12-18 DIAGNOSIS — E785 Hyperlipidemia, unspecified: Secondary | ICD-10-CM | POA: Diagnosis not present

## 2016-12-18 DIAGNOSIS — D638 Anemia in other chronic diseases classified elsewhere: Secondary | ICD-10-CM | POA: Diagnosis not present

## 2016-12-18 DIAGNOSIS — I480 Paroxysmal atrial fibrillation: Secondary | ICD-10-CM

## 2016-12-18 DIAGNOSIS — Z950 Presence of cardiac pacemaker: Secondary | ICD-10-CM | POA: Diagnosis not present

## 2016-12-18 DIAGNOSIS — I5042 Chronic combined systolic (congestive) and diastolic (congestive) heart failure: Secondary | ICD-10-CM | POA: Diagnosis not present

## 2016-12-18 DIAGNOSIS — I255 Ischemic cardiomyopathy: Secondary | ICD-10-CM | POA: Diagnosis not present

## 2016-12-18 DIAGNOSIS — Z7901 Long term (current) use of anticoagulants: Secondary | ICD-10-CM

## 2016-12-18 DIAGNOSIS — Z9981 Dependence on supplemental oxygen: Secondary | ICD-10-CM | POA: Diagnosis not present

## 2016-12-18 DIAGNOSIS — I739 Peripheral vascular disease, unspecified: Secondary | ICD-10-CM | POA: Diagnosis not present

## 2016-12-18 DIAGNOSIS — I130 Hypertensive heart and chronic kidney disease with heart failure and stage 1 through stage 4 chronic kidney disease, or unspecified chronic kidney disease: Secondary | ICD-10-CM | POA: Diagnosis not present

## 2016-12-18 DIAGNOSIS — Z5181 Encounter for therapeutic drug level monitoring: Secondary | ICD-10-CM | POA: Diagnosis not present

## 2016-12-18 LAB — POCT INR: INR: 1.5

## 2016-12-18 NOTE — Patient Outreach (Signed)
[facility]) Care Management  12/18/2016  Valerie Terrell 06-11-1932 [id number]  Subjective: client reports she is feeling better, denies any concerns or issues at this time.  Objective: none-telephonic assessment.  Assessment: 83 year old with recent admission 7/16-7/23 with respiratory failure due to pulmonary edema due to volume overload from acute kidney failure; Pacer placed 7/19 due to tachy brady syndrome. Client readmitted 7/31-8/2 with heart failure, atrial fibrillation, hypertension.  RNCM called to complete transition of care call. RNCM spoke with client. Daughter, Bryson Ha noted to be in background while client was completing assessment. Client denies any questions or concerns. She reports home health was out to see her today and INR bloodwork drawn.   Medications reviewed. No issues or concerns noted. Upcoming appointments discussed.   Client acknowledges that she is weighing herself daily. RNCM encouraged client to record weights. Reinforced when to call the doctor.  Plan: telephonic follow up next week-daughter works and home visit after that.   Thea Silversmith, RN, MSN, [place] Coordinator Cell: [phone]

## 2016-12-21 ENCOUNTER — Telehealth: Payer: Self-pay | Admitting: Cardiology

## 2016-12-21 ENCOUNTER — Ambulatory Visit: Payer: Medicare Other

## 2016-12-21 ENCOUNTER — Ambulatory Visit (INDEPENDENT_AMBULATORY_CARE_PROVIDER_SITE_OTHER): Payer: Medicare Other | Admitting: Pharmacist Clinician (PhC)/ Clinical Pharmacy Specialist

## 2016-12-21 ENCOUNTER — Other Ambulatory Visit: Payer: Self-pay

## 2016-12-21 DIAGNOSIS — I255 Ischemic cardiomyopathy: Secondary | ICD-10-CM | POA: Diagnosis not present

## 2016-12-21 DIAGNOSIS — N183 Chronic kidney disease, stage 3 (moderate): Secondary | ICD-10-CM | POA: Diagnosis not present

## 2016-12-21 DIAGNOSIS — E785 Hyperlipidemia, unspecified: Secondary | ICD-10-CM | POA: Diagnosis not present

## 2016-12-21 DIAGNOSIS — I739 Peripheral vascular disease, unspecified: Secondary | ICD-10-CM | POA: Diagnosis not present

## 2016-12-21 DIAGNOSIS — I130 Hypertensive heart and chronic kidney disease with heart failure and stage 1 through stage 4 chronic kidney disease, or unspecified chronic kidney disease: Secondary | ICD-10-CM | POA: Diagnosis not present

## 2016-12-21 DIAGNOSIS — Z9981 Dependence on supplemental oxygen: Secondary | ICD-10-CM | POA: Diagnosis not present

## 2016-12-21 DIAGNOSIS — I5042 Chronic combined systolic (congestive) and diastolic (congestive) heart failure: Secondary | ICD-10-CM | POA: Diagnosis not present

## 2016-12-21 DIAGNOSIS — Z950 Presence of cardiac pacemaker: Secondary | ICD-10-CM | POA: Diagnosis not present

## 2016-12-21 DIAGNOSIS — D638 Anemia in other chronic diseases classified elsewhere: Secondary | ICD-10-CM | POA: Diagnosis not present

## 2016-12-21 DIAGNOSIS — I480 Paroxysmal atrial fibrillation: Secondary | ICD-10-CM

## 2016-12-21 DIAGNOSIS — Z7901 Long term (current) use of anticoagulants: Secondary | ICD-10-CM

## 2016-12-21 DIAGNOSIS — Z79891 Long term (current) use of opiate analgesic: Secondary | ICD-10-CM | POA: Diagnosis not present

## 2016-12-21 DIAGNOSIS — Z5181 Encounter for therapeutic drug level monitoring: Secondary | ICD-10-CM | POA: Diagnosis not present

## 2016-12-21 LAB — POCT INR: INR: 1.6

## 2016-12-21 MED ORDER — WARFARIN SODIUM 3 MG PO TABS: ORAL_TABLET | ORAL | 1 refills | Status: DC

## 2016-12-21 NOTE — Patient Outreach (Signed)
[facility]) Care Management  12/21/2016  Valerie Terrell October 29, 1932 [id number]   Subjective: Client reports her only issue is missing one of her hearing aides.  Objective: none-telephonic assessment.  Assessment: 83 year old with recent admission 7/16-7/23 with respiratory failure due to pulmonary edema due to volume overload from acute kidney failure; Pacer placed 7/19 due to tachy brady syndrome. Client readmitted 7/31-8/2 with heart failure, atrial fibrillation, hypertension.  RNCM called to complete transition of care call. Valerie Terrell reports she is doing well.  She states her only issue is missing her hearing aide. Valerie Terrell states she had them when she was in the hospital, but only had one when she got home. RNCM encouraged her/her daughter to call the unit to follow up.  She denies any shortness of breath, denies palpitations. Confirms home health involved in her care.  Plan: home visit next week.  Thea Silversmith, RN, MSN, [place] Coordinator Cell: [phone]

## 2016-12-21 NOTE — Telephone Encounter (Signed)
See anticoag note

## 2016-12-21 NOTE — Telephone Encounter (Signed)
Valerie Terrell with [facility] calling, would like to speak with coumadin clinic. Call was disconnected, due to system shutdown during the call

## 2016-12-21 NOTE — Telephone Encounter (Signed)
Valerie Terrell (nurse from [place]) calling, would like to

## 2016-12-22 DIAGNOSIS — I10 Essential (primary) hypertension: Secondary | ICD-10-CM | POA: Diagnosis not present

## 2016-12-22 DIAGNOSIS — E8770 Fluid overload, unspecified: Secondary | ICD-10-CM | POA: Diagnosis not present

## 2016-12-22 DIAGNOSIS — D649 Anemia, unspecified: Secondary | ICD-10-CM | POA: Diagnosis not present

## 2016-12-22 DIAGNOSIS — N184 Chronic kidney disease, stage 4 (severe): Secondary | ICD-10-CM | POA: Diagnosis not present

## 2016-12-22 DIAGNOSIS — N261 Atrophy of kidney (terminal): Secondary | ICD-10-CM | POA: Diagnosis not present

## 2016-12-23 DIAGNOSIS — Z79891 Long term (current) use of opiate analgesic: Secondary | ICD-10-CM | POA: Diagnosis not present

## 2016-12-23 DIAGNOSIS — Z9981 Dependence on supplemental oxygen: Secondary | ICD-10-CM | POA: Diagnosis not present

## 2016-12-23 DIAGNOSIS — N183 Chronic kidney disease, stage 3 (moderate): Secondary | ICD-10-CM | POA: Diagnosis not present

## 2016-12-23 DIAGNOSIS — I255 Ischemic cardiomyopathy: Secondary | ICD-10-CM | POA: Diagnosis not present

## 2016-12-23 DIAGNOSIS — Z950 Presence of cardiac pacemaker: Secondary | ICD-10-CM | POA: Diagnosis not present

## 2016-12-23 DIAGNOSIS — I130 Hypertensive heart and chronic kidney disease with heart failure and stage 1 through stage 4 chronic kidney disease, or unspecified chronic kidney disease: Secondary | ICD-10-CM | POA: Diagnosis not present

## 2016-12-23 DIAGNOSIS — E785 Hyperlipidemia, unspecified: Secondary | ICD-10-CM | POA: Diagnosis not present

## 2016-12-23 DIAGNOSIS — I739 Peripheral vascular disease, unspecified: Secondary | ICD-10-CM | POA: Diagnosis not present

## 2016-12-23 DIAGNOSIS — Z5181 Encounter for therapeutic drug level monitoring: Secondary | ICD-10-CM | POA: Diagnosis not present

## 2016-12-23 DIAGNOSIS — D638 Anemia in other chronic diseases classified elsewhere: Secondary | ICD-10-CM | POA: Diagnosis not present

## 2016-12-23 DIAGNOSIS — I5042 Chronic combined systolic (congestive) and diastolic (congestive) heart failure: Secondary | ICD-10-CM | POA: Diagnosis not present

## 2016-12-24 DIAGNOSIS — K1230 Oral mucositis (ulcerative), unspecified: Secondary | ICD-10-CM | POA: Diagnosis not present

## 2016-12-24 DIAGNOSIS — I5042 Chronic combined systolic (congestive) and diastolic (congestive) heart failure: Secondary | ICD-10-CM | POA: Diagnosis not present

## 2016-12-24 DIAGNOSIS — I255 Ischemic cardiomyopathy: Secondary | ICD-10-CM | POA: Diagnosis not present

## 2016-12-24 DIAGNOSIS — I739 Peripheral vascular disease, unspecified: Secondary | ICD-10-CM | POA: Diagnosis not present

## 2016-12-25 ENCOUNTER — Telehealth: Payer: Self-pay | Admitting: Cardiovascular Disease

## 2016-12-25 ENCOUNTER — Ambulatory Visit (INDEPENDENT_AMBULATORY_CARE_PROVIDER_SITE_OTHER): Payer: Medicare Other | Admitting: *Deleted

## 2016-12-25 ENCOUNTER — Ambulatory Visit (INDEPENDENT_AMBULATORY_CARE_PROVIDER_SITE_OTHER): Payer: Medicare Other | Admitting: Pharmacist

## 2016-12-25 ENCOUNTER — Telehealth: Payer: Self-pay | Admitting: Student

## 2016-12-25 DIAGNOSIS — D638 Anemia in other chronic diseases classified elsewhere: Secondary | ICD-10-CM | POA: Diagnosis not present

## 2016-12-25 DIAGNOSIS — Z79891 Long term (current) use of opiate analgesic: Secondary | ICD-10-CM | POA: Diagnosis not present

## 2016-12-25 DIAGNOSIS — I495 Sick sinus syndrome: Secondary | ICD-10-CM | POA: Diagnosis not present

## 2016-12-25 DIAGNOSIS — I480 Paroxysmal atrial fibrillation: Secondary | ICD-10-CM

## 2016-12-25 DIAGNOSIS — I130 Hypertensive heart and chronic kidney disease with heart failure and stage 1 through stage 4 chronic kidney disease, or unspecified chronic kidney disease: Secondary | ICD-10-CM | POA: Diagnosis not present

## 2016-12-25 DIAGNOSIS — N183 Chronic kidney disease, stage 3 (moderate): Secondary | ICD-10-CM | POA: Diagnosis not present

## 2016-12-25 DIAGNOSIS — Z9981 Dependence on supplemental oxygen: Secondary | ICD-10-CM | POA: Diagnosis not present

## 2016-12-25 DIAGNOSIS — I5042 Chronic combined systolic (congestive) and diastolic (congestive) heart failure: Secondary | ICD-10-CM | POA: Diagnosis not present

## 2016-12-25 DIAGNOSIS — Z5181 Encounter for therapeutic drug level monitoring: Secondary | ICD-10-CM | POA: Diagnosis not present

## 2016-12-25 DIAGNOSIS — Z7901 Long term (current) use of anticoagulants: Secondary | ICD-10-CM

## 2016-12-25 DIAGNOSIS — Z950 Presence of cardiac pacemaker: Secondary | ICD-10-CM | POA: Diagnosis not present

## 2016-12-25 DIAGNOSIS — I255 Ischemic cardiomyopathy: Secondary | ICD-10-CM | POA: Diagnosis not present

## 2016-12-25 DIAGNOSIS — I739 Peripheral vascular disease, unspecified: Secondary | ICD-10-CM | POA: Diagnosis not present

## 2016-12-25 DIAGNOSIS — E785 Hyperlipidemia, unspecified: Secondary | ICD-10-CM | POA: Diagnosis not present

## 2016-12-25 LAB — CUP PACEART INCLINIC DEVICE CHECK
Battery Voltage: 3.07 V
Brady Statistic RV Percent Paced: 0.08 %
Implantable Lead Implant Date: 20180719
Implantable Lead Location: 753859
Implantable Pulse Generator Implant Date: 20180719
Lead Channel Impedance Value: 400 Ohm
Lead Channel Impedance Value: 475 Ohm
Lead Channel Pacing Threshold Amplitude: 0.75 V
Lead Channel Pacing Threshold Amplitude: 0.75 V
Lead Channel Pacing Threshold Pulse Width: 0.5 ms
Lead Channel Pacing Threshold Pulse Width: 0.5 ms
Lead Channel Sensing Intrinsic Amplitude: 12 mV
Lead Channel Sensing Intrinsic Amplitude: 3 mV
Lead Channel Setting Pacing Amplitude: 3.5 V
Lead Channel Setting Pacing Amplitude: 3.5 V
Lead Channel Setting Sensing Sensitivity: 2 mV
MDC IDC LEAD IMPLANT DT: 20180719
MDC IDC LEAD LOCATION: 753860
MDC IDC MSMT BATTERY REMAINING LONGEVITY: 140 mo
MDC IDC MSMT LEADCHNL RA PACING THRESHOLD AMPLITUDE: 0.75 V
MDC IDC MSMT LEADCHNL RA PACING THRESHOLD PULSEWIDTH: 0.5 ms
MDC IDC MSMT LEADCHNL RA PACING THRESHOLD PULSEWIDTH: 0.5 ms
MDC IDC MSMT LEADCHNL RV PACING THRESHOLD AMPLITUDE: 0.75 V
MDC IDC PG SERIAL: 8918511
MDC IDC SESS DTM: 20180810153900
MDC IDC SET LEADCHNL RV PACING PULSEWIDTH: 0.5 ms
MDC IDC STAT BRADY RA PERCENT PACED: 6.2 %

## 2016-12-25 LAB — PROTIME-INR: INR: 2.1 — AB (ref <=1.1)

## 2016-12-25 NOTE — Telephone Encounter (Signed)
returned call to RN  See anti-coagulation note for details

## 2016-12-25 NOTE — Telephone Encounter (Signed)
Will route to CVRR

## 2016-12-25 NOTE — Progress Notes (Signed)
Wound check appointment. Steri-strips removed. Wound without redness or edema. Incision edges approximated, wound well healed. Normal device function. Thresholds, sensing, and impedances consistent with implant measurements. Device programmed at 3.5V for extra safety margin until 3 month visit. Histogram distribution appropriate for patient and level of activity. 2.9% AT/AF burden, max dur. 3hrs 77mins + warfarin. No high ventricular rates noted. Patient educated about wound care, arm mobility, lifting restrictions. ROV in 3 months with GT.

## 2016-12-25 NOTE — Telephone Encounter (Signed)
New message   Carry from [place] called requesting to speak with RN about PT INR. Please call back to discuss

## 2016-12-25 NOTE — Telephone Encounter (Signed)
Received incoming records from [place] for upcoming appointment on 01/05/17 @ 10:00am with [place]. Records given to [facility] in Medical Records. 12/25/16 ab

## 2016-12-28 DIAGNOSIS — I5042 Chronic combined systolic (congestive) and diastolic (congestive) heart failure: Secondary | ICD-10-CM | POA: Diagnosis not present

## 2016-12-28 DIAGNOSIS — Z5181 Encounter for therapeutic drug level monitoring: Secondary | ICD-10-CM | POA: Diagnosis not present

## 2016-12-28 DIAGNOSIS — I130 Hypertensive heart and chronic kidney disease with heart failure and stage 1 through stage 4 chronic kidney disease, or unspecified chronic kidney disease: Secondary | ICD-10-CM | POA: Diagnosis not present

## 2016-12-28 DIAGNOSIS — Z9981 Dependence on supplemental oxygen: Secondary | ICD-10-CM | POA: Diagnosis not present

## 2016-12-28 DIAGNOSIS — N183 Chronic kidney disease, stage 3 (moderate): Secondary | ICD-10-CM | POA: Diagnosis not present

## 2016-12-28 DIAGNOSIS — D638 Anemia in other chronic diseases classified elsewhere: Secondary | ICD-10-CM | POA: Diagnosis not present

## 2016-12-28 DIAGNOSIS — I739 Peripheral vascular disease, unspecified: Secondary | ICD-10-CM | POA: Diagnosis not present

## 2016-12-28 DIAGNOSIS — Z79891 Long term (current) use of opiate analgesic: Secondary | ICD-10-CM | POA: Diagnosis not present

## 2016-12-28 DIAGNOSIS — E785 Hyperlipidemia, unspecified: Secondary | ICD-10-CM | POA: Diagnosis not present

## 2016-12-28 DIAGNOSIS — I255 Ischemic cardiomyopathy: Secondary | ICD-10-CM | POA: Diagnosis not present

## 2016-12-28 DIAGNOSIS — Z950 Presence of cardiac pacemaker: Secondary | ICD-10-CM | POA: Diagnosis not present

## 2016-12-29 DIAGNOSIS — Z79891 Long term (current) use of opiate analgesic: Secondary | ICD-10-CM | POA: Diagnosis not present

## 2016-12-29 DIAGNOSIS — I5042 Chronic combined systolic (congestive) and diastolic (congestive) heart failure: Secondary | ICD-10-CM | POA: Diagnosis not present

## 2016-12-29 DIAGNOSIS — E785 Hyperlipidemia, unspecified: Secondary | ICD-10-CM | POA: Diagnosis not present

## 2016-12-29 DIAGNOSIS — N183 Chronic kidney disease, stage 3 (moderate): Secondary | ICD-10-CM | POA: Diagnosis not present

## 2016-12-29 DIAGNOSIS — Z950 Presence of cardiac pacemaker: Secondary | ICD-10-CM | POA: Diagnosis not present

## 2016-12-29 DIAGNOSIS — I130 Hypertensive heart and chronic kidney disease with heart failure and stage 1 through stage 4 chronic kidney disease, or unspecified chronic kidney disease: Secondary | ICD-10-CM | POA: Diagnosis not present

## 2016-12-29 DIAGNOSIS — I255 Ischemic cardiomyopathy: Secondary | ICD-10-CM | POA: Diagnosis not present

## 2016-12-29 DIAGNOSIS — Z9981 Dependence on supplemental oxygen: Secondary | ICD-10-CM | POA: Diagnosis not present

## 2016-12-29 DIAGNOSIS — I739 Peripheral vascular disease, unspecified: Secondary | ICD-10-CM | POA: Diagnosis not present

## 2016-12-29 DIAGNOSIS — Z5181 Encounter for therapeutic drug level monitoring: Secondary | ICD-10-CM | POA: Diagnosis not present

## 2016-12-29 DIAGNOSIS — D638 Anemia in other chronic diseases classified elsewhere: Secondary | ICD-10-CM | POA: Diagnosis not present

## 2016-12-30 ENCOUNTER — Other Ambulatory Visit: Payer: Self-pay

## 2016-12-30 NOTE — Patient Outreach (Signed)
[place] [facility]) Care Management   12/30/2016  Valerie Terrell [birth year] [id number]  Valerie Terrell is an 83 y.o. female  Subjective: Client reports she is still feeling weak, but states she feels better than a few weeks ago.  Objective:  BP (!) 170/78   Pulse 62   Resp 20   Ht 1.499 m (4' 11" )   Wt 110 lb (49.9 kg)   SpO2 98% Comment: 2.5L  BMI 22.22 kg/m   Review of Systems  Respiratory:       Faint inspiratory crackles noted.  Cardiovascular:       Heart rate regular, even; bilateral lower extremity edema noted.  Skin: Negative.     Physical Exam  Encounter Medications:   Outpatient Encounter Prescriptions as of 12/30/2016  Medication Sig Note  . acetaminophen (TYLENOL) 500 MG tablet Take 500 mg by mouth every 6 (six) hours as needed for moderate pain.   [name] amiodarone (PACERONE) 200 MG tablet Take 1 tablet (200 mg total) by mouth 2 (two) times daily.   . Calcium Carb-Cholecalciferol (CALCIUM/VITAMIN D PO) Take 1 tablet by mouth 2 (two) times daily.   . carvedilol (COREG) 25 MG tablet Take 1 tablet (25 mg total) by mouth 2 (two) times daily with a meal.   . furosemide (LASIX) 80 MG tablet Take 2 tablets (160 mg total) by mouth 2 (two) times daily.   . hydrALAZINE (APRESOLINE) 100 MG tablet Take 1 tablet (100 mg total) by mouth 3 (three) times daily.   [name] ibandronate (BONIVA) 150 MG tablet Take 1 tablet (150 mg total) by mouth every 30 (thirty) days. Take in the morning with a full glass of water, on an empty stomach, and do not take anything else by mouth or lie down for the next 30 min.   . isosorbide mononitrate (IMDUR) 60 MG 24 hr tablet Take 1 tablet (60 mg total) by mouth daily.   . Multiple Vitamin (MULTIVITAMIN) tablet Take 1 tablet by mouth 3 (three) times a week.    . ondansetron (ZOFRAN) 4 MG tablet Take 0.5-1 tablets (2-4 mg total) by mouth every 8 (eight) hours as needed for nausea or vomiting. 12/14/2016: Took 2 mg  . oxyCODONE-acetaminophen  (PERCOCET/ROXICET) 5-325 MG tablet Take 1-2 tablets by mouth every 6 (six) hours as needed for severe pain.   . potassium chloride 20 MEQ TBCR Take 20 mEq by mouth 2 (two) times daily.   . pravastatin (PRAVACHOL) 40 MG tablet Take 40 mg by mouth every evening.   . vitamin C (ASCORBIC ACID) 500 MG tablet Take 500 mg by mouth daily.   . Vitamin D, Ergocalciferol, (DRISDOL) 50000 units CAPS capsule 1 cap po q week x 12 weeks (Patient taking differently: Take 50,000 Units by mouth every 7 (seven) days. For 12 weeks. (Thursdays))   . warfarin (COUMADIN) 3 MG tablet Take 1/2 to 1 tablet by mouth daily as directed by coumadin clinic   . feeding supplement, ENSURE ENLIVE, (ENSURE ENLIVE) LIQD Take 237 mLs by mouth 2 (two) times daily between meals. (Patient not taking: Reported on 12/30/2016)   . fluticasone (CUTIVATE) 0.05 % cream Apply topically 2 (two) times daily. (Patient not taking: Reported on 12/30/2016)    No facility-administered encounter medications on file as of 12/30/2016.     Functional Status:   In your present state of health, do you have any difficulty performing the following activities: 12/30/2016 12/15/2016  Hearing? N Y  Vision? N N  Difficulty concentrating or making decisions?  Y N  Comment "remebering sometimes I do" -  Walking or climbing stairs? N Y  Dressing or bathing? Y Y  Comment "due to energy take her time to complete" -  Doing errands, shopping? Tempie Donning  Preparing Food and eating ? N -  Using the Toilet? N -  In the past six months, have you accidently leaked urine? N -  Do you have problems with loss of bowel control? N -  Managing your Medications? N -  Comment daughter assist -  Managing your Finances? N -  Housekeeping or managing your Housekeeping? N -  Some recent data might be hidden    Fall/Depression Screening:    Fall Risk  12/30/2016 09/15/2016 07/22/2016  Falls in the past year? Yes Yes No  Number falls in past yr: 2 or more 1 -  Injury with Fall? - No -   Risk Factor Category  High Fall Risk - -  Risk for fall due to : Impaired balance/gait;History of fall(s) - -  Follow up Falls prevention discussed;Education provided - -   PHQ 2/9 Scores 12/30/2016 07/22/2016 06/10/2016 03/23/2016  PHQ - 2 Score 0 0 0 2  PHQ- 9 Score - - - 4    Assessment:  83 year old with recent admission 7/16-7/23 with respiratory failure due to pulmonary edema due to volume overload from acute kidney failure; Pacer placed 7/19 due to tachy brady syndrome. Client readmitted 7/31-8/2 with heart failure, atrial fibrillation, hypertension.  Home visit completed. Present during visit was clients daughter, Makenley Shimp. Client reports she is feeling better,  However still weak and tires easily. She expressed concern with the amount of energy required to go to cardiologist office for INR blood draw alone. Home health is currently still involved, but daughter reports they do not have many visits left. Client with history of atrial fibrillation-discussed the atrial fibrillation clinic and also home INR monitoring. Ms. Spainhower to discuss with cardiology next week.    Medications reviewed: currently client's daughter is managing medication. No questions or concerns regarding medication management. Client with questions stating potassium will need to be refilled soon, but does not know which physician would refill prescription. Client/daughter informed that furosemide and potassium often being prescribed together and the reason. Informed that continued use can be affected by potassium level-routinely checked by provider. Client reports she has an appointment scheduled with cardiology next week and will follow up with cardiologist and/or follow up with primary care provider if needed.    Upcoming appointments discussed: 8/21 Bernerd Pho, Hershal Coria, cardiology 8/22 Dr. Anitra Lauth, primary care   Plan: transition of care call next week. [facility] CM Care Plan Problem One     Most Recent Value   Care Plan Problem One  at risk for readmission, recent hospitalization.  Role Documenting the Problem One  Care Management [place] for Problem One  Active  [facility] Long Term Goal   client will not be readmitted within the next 31 days.  [facility] Long Term Goal Start Date  12/18/16  [facility] Long Term Goal Met Date  -- [continue]  Interventions for Problem One Long Term Goal  home visit scheduled, provided [facility] calender/organizer and instructed on use pointing out the atrial fibrillation zone tool and heart failure zone tool, reviewed upcoming appointments, discussed importance of attending follow up appointments  [facility] CM Short Term Goal #1   client will verbalize attending scheduled provider visits within the next 30 days.  [facility] CM Short Term Goal #  1 Start Date  12/18/16  [facility] CM Short Term Goal #1 Met Date  12/30/16  [facility] CM Short Term Goal #2   client will verbalize community resources to call with questions within the next 30 days.  [facility] CM Short Term Goal #2 Start Date  12/18/16  Interventions for Short Term Goal #2  reinforced 24 hour nurse advice line      Thea Silversmith, RN, MSN, [place] Coordinator Cell: [phone]

## 2016-12-31 ENCOUNTER — Emergency Department (HOSPITAL_COMMUNITY): Payer: Medicare Other

## 2016-12-31 ENCOUNTER — Encounter: Payer: Self-pay | Admitting: Family Medicine

## 2016-12-31 ENCOUNTER — Emergency Department (HOSPITAL_COMMUNITY)
Admission: EM | Admit: 2016-12-31 | Discharge: 2016-12-31 | Disposition: A | Discharge status: Home / Self Care | Payer: Medicare Other | Attending: Emergency Medicine | Admitting: Emergency Medicine

## 2016-12-31 ENCOUNTER — Other Ambulatory Visit: Payer: Self-pay | Admitting: Family Medicine

## 2016-12-31 ENCOUNTER — Other Ambulatory Visit: Payer: Self-pay

## 2016-12-31 ENCOUNTER — Telehealth: Payer: Self-pay | Admitting: Family Medicine

## 2016-12-31 DIAGNOSIS — Y9389 Activity, other specified: Secondary | ICD-10-CM | POA: Diagnosis not present

## 2016-12-31 DIAGNOSIS — S7001XA Contusion of right hip, initial encounter: Secondary | ICD-10-CM | POA: Insufficient documentation

## 2016-12-31 DIAGNOSIS — R768 Other specified abnormal immunological findings in serum: Secondary | ICD-10-CM

## 2016-12-31 DIAGNOSIS — N289 Disorder of kidney and ureter, unspecified: Secondary | ICD-10-CM

## 2016-12-31 DIAGNOSIS — I130 Hypertensive heart and chronic kidney disease with heart failure and stage 1 through stage 4 chronic kidney disease, or unspecified chronic kidney disease: Secondary | ICD-10-CM | POA: Diagnosis not present

## 2016-12-31 DIAGNOSIS — Y998 Other external cause status: Secondary | ICD-10-CM | POA: Insufficient documentation

## 2016-12-31 DIAGNOSIS — N184 Chronic kidney disease, stage 4 (severe): Secondary | ICD-10-CM

## 2016-12-31 DIAGNOSIS — Z8673 Personal history of transient ischemic attack (TIA), and cerebral infarction without residual deficits: Secondary | ICD-10-CM | POA: Insufficient documentation

## 2016-12-31 DIAGNOSIS — S0990XA Unspecified injury of head, initial encounter: Secondary | ICD-10-CM | POA: Diagnosis not present

## 2016-12-31 DIAGNOSIS — Z7901 Long term (current) use of anticoagulants: Secondary | ICD-10-CM | POA: Diagnosis not present

## 2016-12-31 DIAGNOSIS — W01198A Fall on same level from slipping, tripping and stumbling with subsequent striking against other object, initial encounter: Secondary | ICD-10-CM | POA: Insufficient documentation

## 2016-12-31 DIAGNOSIS — Y92002 Bathroom of unspecified non-institutional (private) residence single-family (private) house as the place of occurrence of the external cause: Secondary | ICD-10-CM | POA: Diagnosis not present

## 2016-12-31 DIAGNOSIS — S79911A Unspecified injury of right hip, initial encounter: Secondary | ICD-10-CM | POA: Diagnosis not present

## 2016-12-31 DIAGNOSIS — R220 Localized swelling, mass and lump, head: Secondary | ICD-10-CM | POA: Diagnosis not present

## 2016-12-31 DIAGNOSIS — S0181XA Laceration without foreign body of other part of head, initial encounter: Secondary | ICD-10-CM | POA: Diagnosis not present

## 2016-12-31 DIAGNOSIS — W19XXXA Unspecified fall, initial encounter: Secondary | ICD-10-CM

## 2016-12-31 DIAGNOSIS — N182 Chronic kidney disease, stage 2 (mild): Secondary | ICD-10-CM | POA: Diagnosis not present

## 2016-12-31 DIAGNOSIS — S199XXA Unspecified injury of neck, initial encounter: Secondary | ICD-10-CM | POA: Diagnosis not present

## 2016-12-31 DIAGNOSIS — D649 Anemia, unspecified: Secondary | ICD-10-CM | POA: Insufficient documentation

## 2016-12-31 DIAGNOSIS — Y92009 Unspecified place in unspecified non-institutional (private) residence as the place of occurrence of the external cause: Secondary | ICD-10-CM

## 2016-12-31 DIAGNOSIS — I5041 Acute combined systolic (congestive) and diastolic (congestive) heart failure: Secondary | ICD-10-CM | POA: Diagnosis not present

## 2016-12-31 DIAGNOSIS — Z950 Presence of cardiac pacemaker: Secondary | ICD-10-CM | POA: Diagnosis not present

## 2016-12-31 DIAGNOSIS — Z79899 Other long term (current) drug therapy: Secondary | ICD-10-CM | POA: Diagnosis not present

## 2016-12-31 LAB — PROTIME-INR
INR: 2.43
PROTHROMBIN TIME: 26.9 s — AB (ref 11.4–15.2)

## 2016-12-31 LAB — CBC WITH DIFFERENTIAL/PLATELET
Basophils Absolute: 0 10*3/uL (ref 0.0–0.1)
Basophils Relative: 0 %
EOS ABS: 0.2 10*3/uL (ref 0.0–0.7)
Eosinophils Relative: 2 %
HCT: 31.3 % — ABNORMAL LOW (ref 36.0–46.0)
HEMOGLOBIN: 9.6 g/dL — AB (ref 12.0–15.0)
LYMPHS ABS: 0.8 10*3/uL (ref 0.7–4.0)
LYMPHS PCT: 9 %
MCH: 28.9 pg (ref 26.0–34.0)
MCHC: 30.7 g/dL (ref 30.0–36.0)
MCV: 94.3 fL (ref 78.0–100.0)
Monocytes Absolute: 0.9 10*3/uL (ref 0.1–1.0)
Monocytes Relative: 10 %
NEUTROS PCT: 79 %
Neutro Abs: 6.8 10*3/uL (ref 1.7–7.7)
Platelets: 268 10*3/uL (ref 150–400)
RBC: 3.32 MIL/uL — AB (ref 3.87–5.11)
RDW: 14.8 % (ref 11.5–15.5)
WBC: 8.6 10*3/uL (ref 4.0–10.5)

## 2016-12-31 LAB — BASIC METABOLIC PANEL
ANION GAP: 13 (ref 5–15)
BUN: 42 mg/dL — ABNORMAL HIGH (ref 6–20)
CHLORIDE: 92 mmol/L — AB (ref 101–111)
CO2: 31 mmol/L (ref 22–32)
Calcium: 8.5 mg/dL — ABNORMAL LOW (ref 8.9–10.3)
Creatinine, Ser: 1.92 mg/dL — ABNORMAL HIGH (ref 0.44–1.00)
GFR calc non Af Amer: 23 mL/min — ABNORMAL LOW (ref >60)
GFR, EST AFRICAN AMERICAN: 27 mL/min — AB (ref >60)
Glucose, Bld: 108 mg/dL — ABNORMAL HIGH (ref 65–99)
POTASSIUM: 3.5 mmol/L (ref 3.5–5.1)
SODIUM: 136 mmol/L (ref 135–145)

## 2016-12-31 LAB — APTT: aPTT: 44 seconds — ABNORMAL HIGH (ref 24–36)

## 2016-12-31 MED ORDER — OXYCODONE-ACETAMINOPHEN 5-325 MG PO TABS: 1.0000 | ORAL_TABLET | Freq: Four times a day (QID) | ORAL | 0 refills | Status: DC | PRN

## 2016-12-31 MED ORDER — LIDOCAINE HCL (PF) 1 % IJ SOLN
5.0000 mL | Freq: Once | INTRAMUSCULAR | Status: AC
  Administered 2016-12-31: 5 mL
  Filled 2016-12-31: qty 5

## 2016-12-31 MED ORDER — POTASSIUM CHLORIDE ER 20 MEQ PO TBCR: 20.0000 meq | EXTENDED_RELEASE_TABLET | Freq: Two times a day (BID) | ORAL | 6 refills | Status: DC

## 2016-12-31 NOTE — Telephone Encounter (Signed)
Please contact patient about a refill. Patient hung up before I could get information

## 2016-12-31 NOTE — Telephone Encounter (Signed)
OK to RF as rx'd (Klor con 20 mEQ, 1 bid, #60, RF x 6)-thx

## 2016-12-31 NOTE — ED Provider Notes (Signed)
[place] DEPT Provider Note   CSN: [id number] Arrival date & time: 12/31/16  0139     History   Chief Complaint Chief Complaint  Patient presents with  . Head Injury    HPI Valerie Terrell is a 83 y.o. female.  The history is provided by the patient.  she was attempting to sit down on a commode when she accidentally lunged forward and struck her forehead against a tub.She suffered a laceration to her forehead. She denies loss of consciousness. She denies weakness, numbness, tingling. She is also complaining of pain in her right hip. She was able to stand and walk following the fall. She does not know when her last tetanus immunization was. She denies other injury.  Past Medical History:  Diagnosis Date  . Atrophy of left kidney    with absent blood flow by renal artery dopplers (Dr. Gwenlyn Found)  . 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 Chronic renal insufficiency, stage 4 (severe) (HCC)    Baseline GFR 25 ml/min-- renal u/s showed atrophic/hypoplastic left kidney.  No hydronephrosis.  Small right renal cyst.  . History of adenomatous polyp of colon   . History of subarachnoid hemorrhage 10/2014   after syncope and while on xarelto  . Hyperlipidemia   . Hypertension    Difficult to control, in the setting of one functioning kidney: pt was referred to nephrology by Dr. Gwenlyn Found 06/2015.  . Lumbar radiculopathy 2012  . Lumbar spondylosis    MR 07/2016---no sign of spinal nerve compression or cord compression.  Pt set up with outpt ortho while admitted to hosp 07/2016.  . Metatarsal fracture 06/10/2016   Nondisplaced, left 5th metatarsal--pt was referred to ortho  . Nonischemic cardiomyopathy ([place])   . Osteopenia 2014   T-score -2.1  . PAF (paroxysmal atrial fibrillation) (HCC)    Eliquis started after BRAO and CVA  . Pulmonary edema 11/2016  . Stroke [facility])    cardioembolic (had CVA while on no anticoag)--"scattered subacute punctate infarcts: 1 in R parietal lobe and  2 in occipital cortex" on MRI br.  CT angio head/neck: aortic arch athero.   . Thoracic compression fracture ([place]) 07/2016   T7 and T8-- T8 kyphoplasty during hosp admission 07/2016.  Neuro referred pt to pain mgmt for consideration of injection 09/2016.  I referred her to endo 08/2016 for consideration of calcitonin treatment.    Patient Active Problem List   Diagnosis Date Noted  . Palpitations 12/15/2016  . Hypokalemia 12/14/2016  . Leukocytosis 12/14/2016  . Atrial fibrillation ([place]) [I48.91] 12/11/2016  . Long term (current) use of anticoagulants [Z79.01] 12/11/2016  . Tachycardia-bradycardia syndrome ([place]) - s/p PPM 12/07/2016  . Nonischemic cardiomyopathy ([place]): EF 35-40% 12/07/2016  . Acute combined systolic and diastolic heart failure ([place]) - possibly related to A. fib 12/07/2016  . Cardiac pacemaker   . Acute respiratory failure with hypoxia ([place])   . Acute pulmonary edema with congestive heart failure ([place]) 11/30/2016  . Elevated troponin level 11/30/2016  . Anemia 11/30/2016  . Chronic midline thoracic back pain 09/15/2016  . Acute CVA (cerebrovascular accident) ([place]) 09/15/2016  . Hyperlipidemia   . Nausea & vomiting 08/05/2016  . Dehydration 08/05/2016  . AKI (acute kidney injury) ([place]) 08/05/2016  . Osteoporosis 07/28/2016  . Closed compression fracture of thoracic vertebra ([place]) 07/23/2016  . Thoracic compression fracture ([place]) 07/22/2016  . Acute renal failure superimposed on chronic kidney disease ([place]) 07/11/2016  . Flank pain 07/11/2016  . Hypoalbuminemia 07/11/2016  .  Renal artery stenosis ([place]) 06/24/2016  . Nondisplaced fracture of fifth metatarsal bone, left foot, initial encounter for closed fracture 06/10/2016  . Rib contusion, left, initial encounter 06/10/2016  . Single kidney 03/13/2016  . Chest pain   . Dyslipidemia   . PAF (paroxysmal atrial fibrillation) ([place]) 01/28/2016  . Essential hypertension 01/28/2016  . Cardioembolic stroke ([place]) 37/90/2409    . 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 Personal history of subarachnoid hemorrhage 01/28/2016  . CKD (chronic kidney disease), stage II 01/28/2016    Past Surgical History:  Procedure Laterality Date  . APPENDECTOMY  child  . CARDIOVASCULAR STRESS TEST  01/31/2016   Stress myoview: NORMAL/Low risk.  EF 56%.  . [place]  . COLONOSCOPY  2015   + hx of adenomatous polyps.  Need digest health spec in [place] records to see when pt due for next colonoscopy  . IR GENERIC HISTORICAL  07/24/2016   IR KYPHO THORACIC WITH BONE BIOPSY 07/24/2016 Luanne Bras, MD [facility]-INTERV RAD  . KYPHOPLASTY  07/27/2016   T8  . PACEMAKER IMPLANT N/A 12/03/2016   Procedure: Pacemaker Implant;  Surgeon: Evans Lance, MD;  Location: [place] CV LAB;  Service: Cardiovascular;  Laterality: N/A;  . Renal artery dopplers  02/26/2016   Her right renal dimension was 11 cm pole to pole with mild to moderate right renal artery stenosis. A right renal aortic ratio was 3.22 suggesting less than a 50% stenosis.  . TONSILLECTOMY    . TRANSTHORACIC ECHOCARDIOGRAM  01/29/2016; 11/2016   2017: EF 55-60%, normal LV wall motion, grade I DD.  No cardiac source of emboli was seen.  2018: EF 35-40%, could not assess DD due to a-fib, pulm HTN noted.    OB History    No data available       Home Medications    Prior to Admission medications   Medication Sig Start Date End Date Taking? Authorizing Provider  acetaminophen (TYLENOL) 500 MG tablet Take 500 mg by mouth every 6 (six) hours as needed for moderate pain.    [provider]  amiodarone (PACERONE) 200 MG tablet Take 1 tablet (200 mg total) by mouth 2 (two) times daily. 12/07/16   Johnson, Clanford L, MD  Calcium Carb-Cholecalciferol (CALCIUM/VITAMIN D PO) Take 1 tablet by mouth 2 (two) times daily.    [provider]  carvedilol (COREG) 25 MG tablet Take 1 tablet (25 mg total) by mouth 2 (two) times daily with a  meal. 12/17/16   Velvet Bathe, MD  feeding supplement, ENSURE ENLIVE, (ENSURE ENLIVE) LIQD Take 237 mLs by mouth 2 (two) times daily between meals. Patient not taking: Reported on 12/30/2016 12/07/16   Irwin Brakeman L, MD  fluticasone (CUTIVATE) 0.05 % cream Apply topically 2 (two) times daily. Patient not taking: Reported on 12/30/2016 11/23/16   Tammi Sou, MD  furosemide (LASIX) 80 MG tablet Take 2 tablets (160 mg total) by mouth 2 (two) times daily. 12/07/16 01/06/17  Irwin Brakeman L, MD  hydrALAZINE (APRESOLINE) 100 MG tablet Take 1 tablet (100 mg total) by mouth 3 (three) times daily. 08/12/16   McGowen, Adrian Blackwater, MD  ibandronate (BONIVA) 150 MG tablet Take 1 tablet (150 mg total) by mouth every 30 (thirty) days. Take in the morning with a full glass of water, on an empty stomach, and do not take anything else by mouth or lie down for the next 30 min. 08/12/16   McGowen, Adrian Blackwater, MD  isosorbide mononitrate (IMDUR) 60 MG 24 hr tablet Take 1 tablet (60 mg total) by mouth daily. 12/18/16   Velvet Bathe, MD  Multiple Vitamin (MULTIVITAMIN) tablet Take 1 tablet by mouth 3 (three) times a week.     [provider]  ondansetron (ZOFRAN) 4 MG tablet Take 0.5-1 tablets (2-4 mg total) by mouth every 8 (eight) hours as needed for nausea or vomiting. 12/10/16   McGowen, Adrian Blackwater, MD  oxyCODONE-acetaminophen (PERCOCET/ROXICET) 5-325 MG tablet Take 1-2 tablets by mouth every 6 (six) hours as needed for severe pain. 08/24/16   McGowen, Adrian Blackwater, MD  potassium chloride 20 MEQ TBCR Take 20 mEq by mouth 2 (two) times daily. 12/17/16   Velvet Bathe, MD  pravastatin (PRAVACHOL) 40 MG tablet Take 40 mg by mouth every evening. 07/16/15   [provider]  vitamin C (ASCORBIC ACID) 500 MG tablet Take 500 mg by mouth daily.    [provider]  Vitamin D, Ergocalciferol, (DRISDOL) 50000 units CAPS capsule 1 cap po q week x 12 weeks Patient taking differently: Take 50,000 Units by mouth every 7  (seven) days. For 12 weeks. (Thursdays) 11/09/16   McGowen, Adrian Blackwater, MD  warfarin (COUMADIN) 3 MG tablet Take 1/2 to 1 tablet by mouth daily as directed by coumadin clinic 12/21/16   Lorretta Harp, MD    Family History Family History  Problem Relation Age of Onset  . Cancer Mother   . Heart disease Father   . Early death Father   . Sudden Cardiac Death Neg Hx     Social History Social History  Substance Use Topics  . Smoking status: Never Smoker  . Smokeless tobacco: Never Used  . Alcohol use 0.6 oz/week    1 Glasses of wine per week     Comment: wine     Allergies   Clonidine derivatives; Codeine; Nickel; and Sulfa antibiotics   Review of Systems Review of Systems  All other systems reviewed and are negative.    Physical Exam Updated Vital Signs BP (!) 188/76   Pulse 64   Resp 20   SpO2 100%   Physical Exam  Nursing note and vitals reviewed.  83 year old female, resting comfortably and in no acute distress. Vital signs are significant for hypertension. Oxygen saturation is 100%, which is normal. Head is normocephalic. There is a small laceration on the right side of the forehead oriented transversely. PERRLA, EOMI. Oropharynx is clear. Neck is nontender without adenopathy or JVD. Back is nontender and there is no CVA tenderness. Lungs are clear without rales, wheezes, or rhonchi. Chest is nontender. Heart has regular rate and rhythm without murmur. Abdomen is soft, flat, nontender without masses or hepatosplenomegaly and peristalsis is normoactive. Extremities have no cyanosis or edema, full range of motion is present. Hematomas palpable in the soft tissues lateral to the right hip. There is tenderness to palpation in the hip region which seems to be centered in the hematoma. Skin is warm and dry without rash. Neurologic: Mental status is normal, cranial nerves are intact, there are no motor or sensory deficits.  ED Treatments / Results   Radiology Ct  Head Wo Contrast  Result Date: 12/31/2016 CLINICAL DATA:  Status post fall in bathroom; hit head on bathtub. Initial encounter. EXAM: CT HEAD WITHOUT CONTRAST TECHNIQUE: Contiguous axial images were obtained from the base of the skull through the vertex without intravenous contrast. COMPARISON:  CT of the head performed 08/05/2016, and MRI of the brain  performed 02/02/2016 FINDINGS: Brain: No evidence of acute infarction, hemorrhage, hydrocephalus, extra-axial collection or mass lesion/mass effect. Prominence of the ventricles and sulci reflects mild to moderate cortical volume loss. Mild cerebellar atrophy is noted. Scattered periventricular and subcortical white matter change likely reflects small vessel ischemic microangiopathy. Chronic ischemic change is noted at the right external capsule. The brainstem and fourth ventricle are within normal limits. The cerebral hemispheres demonstrate grossly normal gray-white differentiation. No mass effect or midline shift is seen. Vascular: No hyperdense vessel or unexpected calcification. Skull: There is no evidence of fracture; visualized osseous structures are unremarkable in appearance. Sinuses/Orbits: The orbits are within normal limits. The paranasal sinuses and mastoid air cells are well-aerated. Other: Soft tissue swelling is noted overlying the right frontal calvarium. IMPRESSION: 1. No evidence traumatic intracranial injury or fracture. 2. Soft tissue swelling overlying the right frontal calvarium. 3. Mild to moderate cortical volume loss and scattered small vessel ischemic microangiopathy. 4. Chronic ischemic change at the right external capsule. Electronically Signed   By: Garald Balding M.D.   On: 12/31/2016 04:50    Procedures Procedures (including critical care time) LACERATION REPAIR Performed by: OMBTD,HRCBU Authorized by: LAGTX,MIWOE Consent: Verbal consent obtained. Risks and benefits: risks, benefits and alternatives were discussed Consent  given by: patient Patient identity confirmed: provided demographic data Prepped and Draped in normal sterile fashion Wound explored  Laceration Location: forhead  Laceration Length: 2.5 cm  No Foreign Bodies seen or palpated  Anesthesia: local infiltration  Local anesthetic: lidocaine 1% without epinephrine  Anesthetic total: 3 ml  Amount of cleaning: standard  Skin closure: close  Number of sutures: 5 5-0 Vicryl Rapide  Technique: simple interrupted  Patient tolerance: Patient tolerated the procedure well with no immediate complications.   Medications Ordered in ED Medications  lidocaine (PF) (XYLOCAINE) 1 % injection 5 mL (not administered)     Initial Impression / Assessment and Plan / ED Course  I have reviewed the triage vital signs and the nursing notes.  Pertinent labs & imaging results that were available during my care of the patient were reviewed by me and considered in my medical decision making (see chart for details).  Fall with injury to head and right hip. Right hip tenderness seems clearly to be in a hematoma which is separate from the bone, but will send for x-ray. CT of head and come back unremarkable, but she will be sent for CT of cervical spine based on mechanism of injury. Old records are reviewed, and last tetanus immunization was in 2016.  X-ray shows no hip fracture. CT of head and cervical spine show no acute injury. Laceration is closed with suturing. Patient is discharged home. Of note, INR is therapeutic. Patient is advised of risk of delayed bleeding.  Final Clinical Impressions(s) / ED Diagnoses   Final diagnoses:  Fall at home, initial encounter  Forehead laceration, initial encounter  Contusion of right hip, initial encounter  Anticoagulated on warfarin  Normochromic normocytic anemia  Renal insufficiency    New Prescriptions New Prescriptions   No medications on file     Delora Fuel, MD 32/12/24 [phone]

## 2016-12-31 NOTE — Discharge Instructions (Signed)
Stitches will dissolve over the next week.

## 2016-12-31 NOTE — ED Notes (Signed)
Patient c/o right hip pain and pain in forehead.

## 2016-12-31 NOTE — ED Notes (Signed)
See paper chart for triage and orders (CBC, BMET, PT/INR, CT head wo contrast)

## 2016-12-31 NOTE — Telephone Encounter (Signed)
[facility].  RF request for oxycodone/apap LOV: 12/09/16 Next ov: 01/06/17 Last written: 08/24/16 #60 w/ 0RF  Please advise. Thanks.

## 2016-12-31 NOTE — Telephone Encounter (Signed)
Patient was given Klor Kon 20 BID in hospital.  She is now requesting rf from [place] to [place].  Please advise.

## 2016-12-31 NOTE — Telephone Encounter (Signed)
RX sent

## 2017-01-01 ENCOUNTER — Telehealth: Payer: Self-pay | Admitting: Student

## 2017-01-01 ENCOUNTER — Ambulatory Visit (INDEPENDENT_AMBULATORY_CARE_PROVIDER_SITE_OTHER): Payer: Self-pay | Admitting: Pharmacist

## 2017-01-01 ENCOUNTER — Telehealth: Payer: Self-pay | Admitting: *Deleted

## 2017-01-01 ENCOUNTER — Other Ambulatory Visit: Payer: Self-pay

## 2017-01-01 DIAGNOSIS — Z79891 Long term (current) use of opiate analgesic: Secondary | ICD-10-CM | POA: Diagnosis not present

## 2017-01-01 DIAGNOSIS — I5042 Chronic combined systolic (congestive) and diastolic (congestive) heart failure: Secondary | ICD-10-CM | POA: Diagnosis not present

## 2017-01-01 DIAGNOSIS — I255 Ischemic cardiomyopathy: Secondary | ICD-10-CM | POA: Diagnosis not present

## 2017-01-01 DIAGNOSIS — Z7901 Long term (current) use of anticoagulants: Secondary | ICD-10-CM

## 2017-01-01 DIAGNOSIS — Z5181 Encounter for therapeutic drug level monitoring: Secondary | ICD-10-CM | POA: Diagnosis not present

## 2017-01-01 DIAGNOSIS — Z9981 Dependence on supplemental oxygen: Secondary | ICD-10-CM | POA: Diagnosis not present

## 2017-01-01 DIAGNOSIS — D638 Anemia in other chronic diseases classified elsewhere: Secondary | ICD-10-CM | POA: Diagnosis not present

## 2017-01-01 DIAGNOSIS — Z950 Presence of cardiac pacemaker: Secondary | ICD-10-CM | POA: Diagnosis not present

## 2017-01-01 DIAGNOSIS — I480 Paroxysmal atrial fibrillation: Secondary | ICD-10-CM

## 2017-01-01 DIAGNOSIS — N183 Chronic kidney disease, stage 3 (moderate): Secondary | ICD-10-CM | POA: Diagnosis not present

## 2017-01-01 DIAGNOSIS — I739 Peripheral vascular disease, unspecified: Secondary | ICD-10-CM | POA: Diagnosis not present

## 2017-01-01 DIAGNOSIS — I130 Hypertensive heart and chronic kidney disease with heart failure and stage 1 through stage 4 chronic kidney disease, or unspecified chronic kidney disease: Secondary | ICD-10-CM | POA: Diagnosis not present

## 2017-01-01 DIAGNOSIS — E785 Hyperlipidemia, unspecified: Secondary | ICD-10-CM | POA: Diagnosis not present

## 2017-01-01 LAB — PROTIME-INR: INR: 2.9 — AB (ref <=1.1)

## 2017-01-01 NOTE — Telephone Encounter (Signed)
Rx put up front for p/u. Pt advised and voiced understanding.   

## 2017-01-01 NOTE — Patient Outreach (Signed)
[place] [facility]) Care Management  01/01/2017  Valerie Terrell 01-Jun-1932 [id number]  Subjective: "I have a black eye, but it will go away"  Objective: none  Assessment: 83 year old with recent admission 7/16-7/23 with respiratory failure due to pulmonary edema due to volume overload from acute kidney failure; Pacer placed 7/19 due to tachy brady syndrome. Client readmitted 7/31-8/2 with heart failure, atrial fibrillation, hypertension.  RNCM called to follow up post Emergency room visit due to fall. Client denies any questions or concern. She reports her goal is to get stronger and home health physical therapist will be out to see her again next week. Upcoming provider appointments reviewed.   Plan: follow up transition of care next week.  Thea Silversmith, RN, MSN, [place] Coordinator Cell: [phone]

## 2017-01-01 NOTE — Telephone Encounter (Signed)
Valerie Terrell with [facility] called wanting to know if she could extend pts in home PT/INR check for 2 more weeks.   Per Dr. Anitra Lauth okay to continue PT/INR checks x 2 weeks.   Valerie Terrell advised and voiced understanding.

## 2017-01-01 NOTE — Telephone Encounter (Signed)
See anti-coagulation note from 01/01/17

## 2017-01-01 NOTE — Telephone Encounter (Signed)
New message    Morey Hummingbird from [facility] is calling about pt. She is calling to report her PTINR.

## 2017-01-03 NOTE — Progress Notes (Signed)
Cardiology Office Note    Date:  01/05/2017   ID:  Valerie Terrell, DOB 1933-03-17, MRN [id number]  PCP:  Valerie Sou, MD  Cardiologist: Dr. Gwenlyn Terrell Electrophysiologist: Dr. Lovena Terrell    Chief Complaint  Patient presents with  . Hospitalization Follow-up    History of Present Illness:    Valerie Terrell is a 83 y.o. female with past medical history of chronic combined systolic and diastolic CHF (EF 71-24% by echo in 11/2016), subarachnoid hemorrhage, renal artery stenosis, HTN, HLD, Stage 3 CKD, tachy-brady syndrome (s/p PPM placement on 12/03/2016) and PAF (on Coumadin) who presents to the office today for hospital follow-up.   She was recently admitted from 12/14/2016 - 12/17/2016 for evaluation of lightheadedness and palpitations. BNP was Terrell to be elevated to 919, therefore she was admitted for evaluation and further treatment. Cardiology was consulted and and her pacemaker was interrogated which showed episodes of atrial flutter with heart rate in the 80's to 130's. She spontaneously converted back to normal sinus rhythm and was continued on Amiodarone and Carvedilol. Coreg was further increased to 25mg  BID with her breakthrough episodes. Imdur was also added to assist with BP control. Weight at the time of discharge was 109 lbs and she was discharged on Lasix 160mg  BID.   In talking with the patient today, she reports working on her strength and endurance since her recent hospitalization. She works with Valerie Terrell multiple times per week and still requires the use of O2 with activity. She denies any recent chest pain or palpitations. Has baseline 2-pillow orthopnea and chronic edema with no recent worsening of her symptoms. Weight has been stable at 110 lbs per her home scales. Reports consuming little to no sodium. Creatinine was most recently checked by her nephrologist, Dr. Florene Terrell, on 12/22/2016 and stable at 1.81 with K+ of 4.3.  She has been checking her blood pressure multiple  times per week and systolic readings are usually in the 150's to 170's. Blood pressure is elevated today at 172/84 during today's visit. Reports being on multiple blood pressure medications in the past but a few of these were discontinued at the time of her pacemaker placement in 11/2016. She remains on Coumadin for anticoagulation and denies any recent melena or hematochezia. Does experience easy bruising.    Past Medical History:  Diagnosis Date  . Atrophy of left kidney    with absent blood flow by renal artery dopplers (Dr. Gwenlyn Terrell)  . 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 Chronic combined systolic and diastolic CHF (congestive heart failure) ([place])    a. 11/2016: echo showing EF of 35-40%, RV strain noted, mild MR and mild TR.   [name] Chronic renal insufficiency, stage 4 (severe) (HCC)    Baseline GFR 25 ml/min-- renal u/s showed atrophic/hypoplastic left kidney.  No hydronephrosis.  Small right renal cyst.  . History of adenomatous polyp of colon   . History of subarachnoid hemorrhage 10/2014   after syncope and while on xarelto  . Hyperlipidemia   . Hypertension    Difficult to control, in the setting of one functioning kidney: Terrell was referred to nephrology by Dr. Gwenlyn Terrell 06/2015.  . Lumbar radiculopathy 2012  . Lumbar spondylosis    MR 07/2016---no sign of spinal nerve compression or cord compression.  Terrell set up with outpt ortho while admitted to hosp 07/2016.  . Metatarsal fracture 06/10/2016   Nondisplaced, left 5th metatarsal--Terrell was referred to ortho  . Nonischemic cardiomyopathy ([place])   .  Osteopenia 2014   T-score -2.1  . PAF (paroxysmal atrial fibrillation) (HCC)    Eliquis started after BRAO and CVA.  She was changed to warfarin 2018.  . Pulmonary edema 11/2016  . Stroke [facility])    cardioembolic (had CVA while on no anticoag)--"scattered subacute punctate infarcts: 1 in R parietal lobe and 2 in occipital cortex" on MRI br.  CT angio head/neck: aortic arch athero.   .  Thoracic compression fracture ([place]) 07/2016   T7 and T8-- T8 kyphoplasty during hosp admission 07/2016.  Neuro referred Terrell to pain mgmt for consideration of injection 09/2016.  I referred her to endo 08/2016 for consideration of calcitonin treatment.    Past Surgical History:  Procedure Laterality Date  . APPENDECTOMY  child  . CARDIOVASCULAR STRESS TEST  01/31/2016   Stress myoview: NORMAL/Low risk.  EF 56%.  . [place]  . COLONOSCOPY  2015   + hx of adenomatous polyps.  Need digest health spec in [place] records to see when Terrell due for next colonoscopy  . IR GENERIC HISTORICAL  07/24/2016   IR KYPHO THORACIC WITH BONE BIOPSY 07/24/2016 Valerie Bras, MD [facility]-INTER[facility] RAD  . KYPHOPLASTY  07/27/2016   T8  . PACEMAKER IMPLANT N/A 12/03/2016   Procedure: Pacemaker Implant;  Surgeon: Terrell Lance, MD;  Location: [place] CV LAB;  Service: Cardiovascular;  Laterality: N/A;  . Renal artery dopplers  02/26/2016   Her right renal dimension was 11 cm pole to pole with mild to moderate right renal artery stenosis. A right renal aortic ratio was 3.22 suggesting less than a 50% stenosis.  . TONSILLECTOMY    . TRANSTHORACIC ECHOCARDIOGRAM  01/29/2016; 11/2016   2017: EF 55-60%, normal LV wall motion, grade I DD.  No cardiac source of emboli was seen.  2018: EF 35-40%, could not assess DD due to a-fib, pulm HTN noted.    Current Medications: Outpatient Medications Prior to Visit  Medication Sig Dispense Refill  . acetaminophen (TYLENOL) 500 MG tablet Take 500 mg by mouth every 6 (six) hours as needed for moderate pain.    . Calcium Carb-Cholecalciferol (CALCIUM/VITAMIN D PO) Take 1 tablet by mouth 2 (two) times daily.    . carvedilol (COREG) 25 MG tablet Take 1 tablet (25 mg total) by mouth 2 (two) times daily with a meal. 60 tablet 0  . furosemide (LASIX) 80 MG tablet Take 2 tablets (160 mg total) by mouth 2 (two) times daily. 120 tablet 0  . hydrALAZINE (APRESOLINE) 100 MG tablet Take  1 tablet (100 mg total) by mouth 3 (three) times daily. 30 tablet 0  . ibandronate (BONIVA) 150 MG tablet Take 1 tablet (150 mg total) by mouth every 30 (thirty) days. Take in the morning with a full glass of water, on an empty stomach, and do not take anything else by mouth or lie down for the next 30 min. 3 tablet 3  . isosorbide mononitrate (IMDUR) 60 MG 24 hr tablet Take 1 tablet (60 mg total) by mouth daily. 30 tablet 0  . Multiple Vitamin (MULTIVITAMIN) tablet Take 1 tablet by mouth 3 (three) times a week.     . ondansetron (ZOFRAN) 4 MG tablet Take 0.5-1 tablets (2-4 mg total) by mouth every 8 (eight) hours as needed for nausea or vomiting. 90 tablet 3  . oxyCODONE-acetaminophen (PERCOCET/ROXICET) 5-325 MG tablet Take 1-2 tablets by mouth every 6 (six) hours as needed for severe pain. 60 tablet 0  . Potassium Chloride ER  20 MEQ TBCR Take 20 mEq by mouth 2 (two) times daily. 60 tablet 6  . pravastatin (PRAVACHOL) 40 MG tablet Take 40 mg by mouth every evening.    . vitamin C (ASCORBIC ACID) 500 MG tablet Take 500 mg by mouth daily.    . Vitamin D, Ergocalciferol, (DRISDOL) 50000 units CAPS capsule 1 cap po q week x 12 weeks (Patient taking differently: Take 50,000 Units by mouth every 7 (seven) days. For 12 weeks. (Thursdays)) 12 capsule 1  . warfarin (COUMADIN) 3 MG tablet Take 1/2 to 1 tablet by mouth daily as directed by coumadin clinic 30 tablet 1  . amiodarone (PACERONE) 200 MG tablet Take 1 tablet (200 mg total) by mouth 2 (two) times daily. 60 tablet 0   No facility-administered medications prior to visit.      Allergies:   Clonidine derivatives; Codeine; Nickel; and Sulfa antibiotics   Social History   Social History  . Marital status: Widowed    Spouse name: N/A  . Number of children: N/A  . Years of education: N/A   Social History Main Topics  . Smoking status: Never Smoker  . Smokeless tobacco: Never Used  . Alcohol use 0.6 oz/week    1 Glasses of wine per week      Comment: wine  . Drug use: No  . Sexual activity: No   Other Topics Concern  . None   Social History Narrative   Widow.  One daughter, lives with her.   Educ: college   Occup: retired Marine scientist.   No T/A/Ds.   She is almost a vegetarian.     Family History:  The patient's family history includes Cancer in her mother; Early death in her father; Heart disease in her father.   Review of Systems:   Please see the history of present illness.     General:  No chills, fever, night sweats or weight changes. Positive for fatigue.  Cardiovascular:  No chest pain, edema, orthopnea, palpitations, paroxysmal nocturnal dyspnea. Positive for dyspnea on exertion.  Dermatological: No rash, lesions/masses Respiratory: No cough, dyspnea Urologic: No hematuria, dysuria Abdominal:   No nausea, vomiting, diarrhea, bright red blood per rectum, melena, or hematemesis Neurologic:  No visual changes, changes in mental status. Positive for weakness.   All other systems reviewed and are otherwise negative except as noted above.   Physical Exam:    VS:  BP (!) 172/84   Pulse 60   Ht 4\' 11"  (1.499 m)   Wt 115 lb 6.4 oz (52.3 kg)   BMI 23.31 kg/m    General: Well developed, elderly Caucasian female appearing in no acute distress. Head: Normocephalic, atraumatic, sclera non-icteric, no xanthomas, nares are without discharge.  Neck: No carotid bruits. JVD not elevated.  Lungs: Respirations regular and unlabored, without wheezes or rales.  Heart: Regular rate and rhythm. No S3 or S4.  No murmur, no rubs, or gallops appreciated. PPM site appears well-healing.  Abdomen: Soft, non-tender, non-distended with normoactive bowel sounds. No hepatomegaly. No rebound/guarding. No obvious abdominal masses. Msk:  Strength and tone appear normal for age. No joint deformities or effusions. Extremities: No clubbing or cyanosis. Chronic nonpitting lower extremity edema.  Distal pedal pulses are 2+ bilaterally. Neuro: Alert  and oriented X 3. Moves all extremities spontaneously. No focal deficits noted. Psych:  Responds to questions appropriately with a normal affect. Skin: No rashes or lesions noted  Wt Readings from Last 3 Encounters:  01/05/17 115 lb 6.4 oz (52.3 kg)  12/30/16 110 lb (49.9 kg)  12/17/16 109 lb 5.6 oz (49.6 kg)     Studies/Labs Reviewed:   EKG:  EKG is not ordered today.   Recent Labs: 12/14/2016: B Natriuretic Peptide 919.4 12/15/2016: ALT 12; Magnesium 1.9; TSH 3.563 12/31/2016: BUN 42; Creatinine, Ser 1.92; Hemoglobin 9.6; Platelets 268; Potassium 3.5; Sodium 136   Lipid Panel    Component Value Date/Time   CHOL 140 06/17/2016 0819   TRIG 146 06/17/2016 0819   HDL 39 (L) 06/17/2016 0819   CHOLHDL 3.6 06/17/2016 0819   CHOLHDL 4.2 01/29/2016 0443   VLDL 36 01/29/2016 0443   LDLCALC 72 06/17/2016 0819    Additional studies/ records that were reviewed today include:   Echocardiogram: 12/02/2016 Study Conclusions  - Left ventricle: The cavity size was normal. There was moderate   concentric hypertrophy. Systolic function was moderately reduced.   The estimated ejection fraction was in the range of 35% to 40%.   Wall motion was normal; there were no regional wall motion   abnormalities. The study was not technically sufficient to allow   evaluation of LV diastolic dysfunction due to atrial   fibrillation. - Aortic valve: Valve mobility was restricted. Transvalvular   velocity was within the normal range. There was no stenosis.   There was no regurgitation. - Mitral valve: There was mild regurgitation. - Left atrium: The atrium was mildly dilated. - Right ventricle: The cavity size was moderately dilated. Wall   thickness was normal. Systolic function was moderately reduced. - Tricuspid valve: There was mild regurgitation. - Pulmonary arteries: Systolic pressure was moderately increased.   PA peak pressure: 47 mm Hg (S). - Inferior vena cava: The vessel was normal in  size. - Pericardium, extracardiac: A trivial pericardial effusion was   identified posterior to the heart. Features were not consistent   with tamponade physiology.  Impressions:  - When compared to the prior study from 07/14/2016 LVEF has   decreased from 60-65% to 35-40%. RV is moderately dilated with   moderately decreased RVEF. There is RV strain. Consider workup   for pulmonary embolism.   The study acquired while the patient in atrial fibrillation with   RVR.  Assessment:    1. Paroxysmal atrial fibrillation (HCC)   2. Long term (current) use of anticoagulants   3. Chronic combined systolic and diastolic heart failure ([place])   4. Tachycardia-bradycardia syndrome ([place])   5. Essential hypertension   6. CKD (chronic kidney disease) stage 4, GFR 15-29 ml/min (HCC)   7. Chronic respiratory failure with hypoxia (HCC)   8. Physical deconditioning      Plan:   In order of problems listed above:  1. Paroxysmal Atrial Fibrillation/ Use of Long-Term Anticoagulation - she has known PAF recently admitted from 12/14/2016 - 12/17/2016 for evaluation of lightheadedness and palpitations, Terrell to be in atrial fibrillation with RVR and to have a mild CHF exacerbation. She experienced spontaneous conversion back to NSR.  - she denies any recurrent palpitations since her hospitalization. Remains on Coreg 25mg  BID and Amiodarone 200mg  BID. TSH and LFT's within normal limits when checked within the past month.  - This patients CHA2DS2-VASc Score and unadjusted Ischemic Stroke Rate (% per year) is equal to 11.2 % stroke rate/year from a score of 7  (CHF, HTN, Female, Age (2), CVA (2)). She denies any evidence of active bleeding. Continue Coumadin for anticoagulation.   2. Chronic Combined Systolic and Diastolic CHF - recent echo showed a reduced EF of 35-40%,  occurring in the setting of atrial fibrillation.  - she has baseline orthopnea and chronic edema but denies any acute worsening of her  symptoms. Weight has been stable at 110 lbs on her home scales. - remains on high-dose Lasix 160mg  BID which is followed by Nephrology. Continue BB therapy along with Imdur (having occasional headaches with this) and Hydralazine. No ACE-I/ARB/ARNI secondary to Stage 4 CKD.   3. Tachycardiac-bradycardia Syndrome - s/p PPM placement in 11/2016. Last interrogated on 12/25/2016 and showed normal device function with 2.9% AT/AF burden.  - followed by Dr. Lovena Terrell.   4. HTN - BP is elevated at 172/84 during today's visit. - continue Lasix 160mg  BID, Hydralazine 100mg  TID, and Imdur 60mg  daily (reports mild headaches already while taking this so will not further titrate dosing at this time).  - will restart Amlodipine at 5mg  daily. Continue to check BP at home and can further titrate if needed.   5. Stage 4 CKD - creatinine at 1.81 on 12/22/2016. - followed by Nephrology (Dr. Florene Terrell).   6. Chronic Respiratory Failure/ Physical Deconditioning - she remains on 2L [place] as O2 saturations decline into the 80's with activity. Continues to work with [place] Terrell. Recommend continuation of Home Health RN as she is unable to ambulate for long distances and experiences significant fatigue when out for appointments (having weekly INR checks at this time).     Medication Adjustments/Labs and Tests Ordered: Current medicines are reviewed at length with the patient today.  Concerns regarding medicines are outlined above.  Medication changes, Labs and Tests ordered today are listed in the Patient Instructions below. Patient Instructions   Medication Instructions:  START AMLODIPINE 5MG  DAILY Coumadin dosing:Decrease dose to 1 tablet daily except 1/2 tablet each Monday, Wednesday and Friday. Repeat in 1 week(~01-08-17).  If you need a refill on your cardiac medications before your next appointment, please call your pharmacy.  Follow-Up: Your physician wants you to follow-up in: 3 MONTHS WITH DR Valerie Terrell.   Special  Instructions: REFERRAL TO [place]   Thank you for choosing [facility]!!    Signed, Erma Heritage, [place]  01/05/2017 1:53 PM    [facility] [place], [place], [place]  [address] Phone: [phone]; Fax: [phone]  [address], [place], [place] [address] Phone: [phone]

## 2017-01-04 ENCOUNTER — Other Ambulatory Visit: Payer: Self-pay | Admitting: Family Medicine

## 2017-01-04 NOTE — Telephone Encounter (Signed)
New medication. Please advise. Thanks.

## 2017-01-04 NOTE — Telephone Encounter (Signed)
Sorry, but this med is managed by her cardiologist, so it needs to be refilled by her cardiologist.--thx

## 2017-01-05 ENCOUNTER — Ambulatory Visit (INDEPENDENT_AMBULATORY_CARE_PROVIDER_SITE_OTHER): Payer: Medicare Other | Admitting: Student

## 2017-01-05 ENCOUNTER — Encounter: Payer: Self-pay | Admitting: Student

## 2017-01-05 VITALS — BP 172/84 | HR 60 | Ht 59.0 in | Wt 115.4 lb

## 2017-01-05 DIAGNOSIS — Z7901 Long term (current) use of anticoagulants: Secondary | ICD-10-CM | POA: Diagnosis not present

## 2017-01-05 DIAGNOSIS — I5042 Chronic combined systolic (congestive) and diastolic (congestive) heart failure: Secondary | ICD-10-CM

## 2017-01-05 DIAGNOSIS — J9611 Chronic respiratory failure with hypoxia: Secondary | ICD-10-CM | POA: Diagnosis not present

## 2017-01-05 DIAGNOSIS — R5381 Other malaise: Secondary | ICD-10-CM | POA: Diagnosis not present

## 2017-01-05 DIAGNOSIS — N184 Chronic kidney disease, stage 4 (severe): Secondary | ICD-10-CM

## 2017-01-05 DIAGNOSIS — I480 Paroxysmal atrial fibrillation: Secondary | ICD-10-CM

## 2017-01-05 DIAGNOSIS — I495 Sick sinus syndrome: Secondary | ICD-10-CM | POA: Diagnosis not present

## 2017-01-05 DIAGNOSIS — I10 Essential (primary) hypertension: Secondary | ICD-10-CM

## 2017-01-05 MED ORDER — AMIODARONE HCL 200 MG PO TABS: 200.0000 mg | ORAL_TABLET | Freq: Two times a day (BID) | ORAL | 6 refills | Status: DC

## 2017-01-05 MED ORDER — AMLODIPINE BESYLATE 5 MG PO TABS: 5.0000 mg | ORAL_TABLET | Freq: Every day | ORAL | 3 refills | Status: DC

## 2017-01-05 NOTE — Patient Instructions (Addendum)
Medication Instructions:  START AMLODIPINE 5MG  DAILY Coumadin dosing:Decrease dose to 1 tablet daily except 1/2 tablet each Monday, Wednesday and Friday. Repeat in 1 week(~01-08-17). If you need a refill on your cardiac medications before your next appointment, please call your pharmacy.  Follow-Up: Your physician wants you to follow-up in: 3 MONTHS WITH DR Gwenlyn Found.   Special Instructions: REFERRAL TO [place]   Thank you for choosing [facility]!!

## 2017-01-05 NOTE — Telephone Encounter (Signed)
Pt advised and voiced understanding. She stated that she seen her cardiologist today and they refilled this medication for her.

## 2017-01-06 ENCOUNTER — Ambulatory Visit (INDEPENDENT_AMBULATORY_CARE_PROVIDER_SITE_OTHER): Payer: Medicare Other | Admitting: Family Medicine

## 2017-01-06 ENCOUNTER — Encounter: Payer: Self-pay | Admitting: Family Medicine

## 2017-01-06 ENCOUNTER — Ambulatory Visit: Payer: Medicare Other | Admitting: Neurology

## 2017-01-06 VITALS — BP 167/69 | HR 62 | Temp 97.7°F | Resp 16 | Ht 59.0 in | Wt 114.5 lb

## 2017-01-06 DIAGNOSIS — I495 Sick sinus syndrome: Secondary | ICD-10-CM

## 2017-01-06 DIAGNOSIS — I10 Essential (primary) hypertension: Secondary | ICD-10-CM | POA: Diagnosis not present

## 2017-01-06 DIAGNOSIS — I5042 Chronic combined systolic (congestive) and diastolic (congestive) heart failure: Secondary | ICD-10-CM | POA: Diagnosis not present

## 2017-01-06 DIAGNOSIS — I428 Other cardiomyopathies: Secondary | ICD-10-CM

## 2017-01-06 DIAGNOSIS — N184 Chronic kidney disease, stage 4 (severe): Secondary | ICD-10-CM

## 2017-01-06 DIAGNOSIS — J9611 Chronic respiratory failure with hypoxia: Secondary | ICD-10-CM | POA: Diagnosis not present

## 2017-01-06 NOTE — Progress Notes (Addendum)
OFFICE VISIT  01/06/2017   CC:  Chief Complaint  Patient presents with  . Follow-up    RCI     HPI:    Patient is a 83 y.o. Caucasian female who presents for 1 mo f/u chronic hypoxic resp failure secondary to chronic combined systolic/diastolic HF, tachy-brady syndrome with pacemaker, CRI stage IV, and HTN. Feeling "pretty good".  Appetite is good.  Most recent nephrol visit 12/22/16 her Cr was 1.8, GFR 26 ==stable. Hb and iron studies stable. Her lasix dosing was kept at 160 mg bid. She feels like her LE edema is better lately.    [place] last week: contusion to face; no fractures.  Some bruising still on face. Not taking any pain medicaton at this time. No hematuria, or blood in stool or melena.  Getting up more and walking; not much SOB with ambulation, though. Had one morning of PND vs panic.  HTN: usual home bp check (not checking often) 517-001 systolic, normal diastolics. Saw cardiology yesterday and amlodipine 5mg  qd was restarted and she took her first dose of this today.  [facility] nursing has been extended by cardiology.  No palpitations or heart racing.  ROS: no chest pain, no fevers, no cough, no focal weakness, no HAs. No n/v/d and no abd pain.  Past Medical History:  Diagnosis Date  . Atrophy of left kidney    with absent blood flow by renal artery dopplers (Dr. Gwenlyn Found)  . 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 Chronic combined systolic and diastolic CHF (congestive heart failure) ([place])    a. 11/2016: echo showing EF of 35-40%, RV strain noted, mild MR and mild TR.   [name] Chronic renal insufficiency, stage 4 (severe) (HCC)    Baseline GFR 25 ml/min-- renal u/s showed atrophic/hypoplastic left kidney.  No hydronephrosis.  Small right renal cyst.  . History of adenomatous polyp of colon   . History of subarachnoid hemorrhage 10/2014   after syncope and while on xarelto  . Hyperlipidemia   . Hypertension    Difficult to control, in the setting of one  functioning kidney: pt was referred to nephrology by Dr. Gwenlyn Found 06/2015.  . Lumbar radiculopathy 2012  . Lumbar spondylosis    MR 07/2016---no sign of spinal nerve compression or cord compression.  Pt set up with outpt ortho while admitted to hosp 07/2016.  . Metatarsal fracture 06/10/2016   Nondisplaced, left 5th metatarsal--pt was referred to ortho  . Nonischemic cardiomyopathy ([place])   . Osteopenia 2014   T-score -2.1  . PAF (paroxysmal atrial fibrillation) (HCC)    Eliquis started after BRAO and CVA.  She was changed to warfarin 2018.  . Pulmonary edema 11/2016  . Stroke [facility])    cardioembolic (had CVA while on no anticoag)--"scattered subacute punctate infarcts: 1 in R parietal lobe and 2 in occipital cortex" on MRI br.  CT angio head/neck: aortic arch athero.   . Thoracic compression fracture ([place]) 07/2016   T7 and T8-- T8 kyphoplasty during hosp admission 07/2016.  Neuro referred pt to pain mgmt for consideration of injection 09/2016.  I referred her to endo 08/2016 for consideration of calcitonin treatment.    Past Surgical History:  Procedure Laterality Date  . APPENDECTOMY  child  . CARDIOVASCULAR STRESS TEST  01/31/2016   Stress myoview: NORMAL/Low risk.  EF 56%.  . [place]  . COLONOSCOPY  2015   + hx of adenomatous polyps.  Need digest health spec in [place] records  to see when pt due for next colonoscopy  . IR GENERIC HISTORICAL  07/24/2016   IR KYPHO THORACIC WITH BONE BIOPSY 07/24/2016 Luanne Bras, MD [facility]-INTER[facility] RAD  . KYPHOPLASTY  07/27/2016   T8  . PACEMAKER IMPLANT N/A 12/03/2016   Procedure: Pacemaker Implant;  Surgeon: Evans Lance, MD;  Location: [place] CV LAB;  Service: Cardiovascular;  Laterality: N/A;  . Renal artery dopplers  02/26/2016   Her right renal dimension was 11 cm pole to pole with mild to moderate right renal artery stenosis. A right renal aortic ratio was 3.22 suggesting less than a 50% stenosis.  . TONSILLECTOMY    .  TRANSTHORACIC ECHOCARDIOGRAM  01/29/2016; 11/2016   2017: EF 55-60%, normal LV wall motion, grade I DD.  No cardiac source of emboli was seen.  2018: EF 35-40%, could not assess DD due to a-fib, pulm HTN noted.    Outpatient Medications Prior to Visit  Medication Sig Dispense Refill  . acetaminophen (TYLENOL) 500 MG tablet Take 500 mg by mouth every 6 (six) hours as needed for moderate pain.    [name] amiodarone (PACERONE) 200 MG tablet Take 1 tablet (200 mg total) by mouth 2 (two) times daily. 60 tablet 6  . amLODipine (NORVASC) 5 MG tablet Take 1 tablet (5 mg total) by mouth daily. 30 tablet 3  . Calcium Carb-Cholecalciferol (CALCIUM/VITAMIN D PO) Take 1 tablet by mouth 2 (two) times daily.    . carvedilol (COREG) 25 MG tablet Take 1 tablet (25 mg total) by mouth 2 (two) times daily with a meal. 60 tablet 0  . furosemide (LASIX) 80 MG tablet Take 2 tablets (160 mg total) by mouth 2 (two) times daily. 120 tablet 0  . hydrALAZINE (APRESOLINE) 100 MG tablet Take 1 tablet (100 mg total) by mouth 3 (three) times daily. 30 tablet 0  . ibandronate (BONIVA) 150 MG tablet Take 1 tablet (150 mg total) by mouth every 30 (thirty) days. Take in the morning with a full glass of water, on an empty stomach, and do not take anything else by mouth or lie down for the next 30 min. 3 tablet 3  . isosorbide mononitrate (IMDUR) 60 MG 24 hr tablet Take 1 tablet (60 mg total) by mouth daily. 30 tablet 0  . Multiple Vitamin (MULTIVITAMIN) tablet Take 1 tablet by mouth 3 (three) times a week.     . Nutritional Supplements (CARNATION INSTANT BREAKFAST PO) Take by mouth.    . ondansetron (ZOFRAN) 4 MG tablet Take 0.5-1 tablets (2-4 mg total) by mouth every 8 (eight) hours as needed for nausea or vomiting. 90 tablet 3  . oxyCODONE-acetaminophen (PERCOCET/ROXICET) 5-325 MG tablet Take 1-2 tablets by mouth every 6 (six) hours as needed for severe pain. 60 tablet 0  . Potassium Chloride ER 20 MEQ TBCR Take 20 mEq by mouth 2 (two)  times daily. 60 tablet 6  . pravastatin (PRAVACHOL) 40 MG tablet Take 40 mg by mouth every evening.    . vitamin C (ASCORBIC ACID) 500 MG tablet Take 500 mg by mouth daily.    . Vitamin D, Ergocalciferol, (DRISDOL) 50000 units CAPS capsule 1 cap po q week x 12 weeks (Patient taking differently: Take 50,000 Units by mouth every 7 (seven) days. For 12 weeks. (Thursdays)) 12 capsule 1  . warfarin (COUMADIN) 3 MG tablet Take 1/2 to 1 tablet by mouth daily as directed by coumadin clinic 30 tablet 1   No facility-administered medications prior to visit.  Allergies  Allergen Reactions  . Clonidine Derivatives Palpitations and Other (See Comments)    Very sedated  . Codeine Nausea Only  . Nickel Rash  . Sulfa Antibiotics Other (See Comments)    Reaction unknown    ROS As per HPI  PE: Blood pressure (!) 167/69, pulse 62, temperature 97.7 F (36.5 C), temperature source Oral, resp. rate 16, height 4\' 11"  (1.499 m), weight 114 lb 8 oz (51.9 kg), SpO2 93 %. 3 L oxygen [place]. Gen: Alert, chronically ill- appearing.  Sitting in [place], in NAD.  Patient is oriented to person, place, time, and situation. AFFECT: pleasant, lucid thought and speech. CV: RRR, soft systolic murmur, no rub/gallop. Chest is clear, no wheezing or rales. Normal symmetric air entry throughout both lung fields. No chest wall deformities or tenderness. EXT: no cyanosis or clubbing.  She has 1-2 + pitting edema mostly in ankles, 1+ in pretibial level bilat. Skin w/out erythema.  LABS:    Chemistry      Component Value Date/Time   NA 136 12/31/2016 0203   NA 137 09/01/2016   K 3.5 12/31/2016 0203   CL 92 (L) 12/31/2016 0203   CO2 31 12/31/2016 0203   BUN 42 (H) 12/31/2016 0203   BUN 51 (A) 09/01/2016   CREATININE 1.92 (H) 12/31/2016 0203   CREATININE 1.63 (H) 03/24/2016 0110   GLU 128 09/01/2016      Component Value Date/Time   CALCIUM 8.5 (L) 12/31/2016 0203   ALKPHOS 50 12/15/2016 0455   AST 34 12/15/2016 0455    ALT 12 (L) 12/15/2016 0455   BILITOT 0.3 12/15/2016 0455   BILITOT 0.2 06/17/2016 0819     Lab Results  Component Value Date   WBC 8.6 12/31/2016   HGB 9.6 (L) 12/31/2016   HCT 31.3 (L) 12/31/2016   MCV 94.3 12/31/2016   PLT 268 12/31/2016   Lab Results  Component Value Date   INR 2.9 (A) 01/01/2017   INR 2.43 12/31/2016   INR 2.1 (A) 12/25/2016    IMPRESSION AND PLAN:  1) Chronic hypoxic resp failure (NICM): The current medical regimen is effective;  continue present plan and medications (lasix 160mg  bid).  Continue 3L oxygen 24/7.  Wt up 4 lbs over the last 1 mo, and this is acceptable.  2) Tachy/brady syndrome: she is s/p pacer placement.  No sx's of tachyarrhythmia. On amiodarone, TSH normal 12/15/16.  Will recheck TSH when I see her back in 2 wks. Coumadin managed by cardiology via [facility] nursing at this time, but pt wants to change to getting this managed through our office in near future. Most recent Hb stable. No s/s of bleeding.  3) CRI stage IV: stable at most recent nephrologist f/u 12/22/16.  Stable anemia of CRI. Unclear when next nephrologist f/u is. Will recheck BMET in 2 wks.  4) HTN: not well controlled.  Cardiology just added back amlodipine 5mg  and she took first dose today. We'll see how this affects her bp and her LE swelling when I see her back in office in 2 wks. Hopefully, swelling will be stable and we can increase amlodipine to 10mg  qd.  An After Visit Summary was printed and given to the patient.  FOLLOW UP: Return in about 2 weeks (around 01/20/2017) for f/u HTN/CRI-check BMET (fasting NOT necessary). And tsh.  Signed:  Crissie Sickles, MD           01/06/2017

## 2017-01-06 NOTE — Patient Instructions (Signed)
Check blood pressure and heart rate once daily and write these numbers down for review with me at follow up visit in 2 weeks.

## 2017-01-07 ENCOUNTER — Other Ambulatory Visit: Payer: Self-pay

## 2017-01-07 ENCOUNTER — Ambulatory Visit (INDEPENDENT_AMBULATORY_CARE_PROVIDER_SITE_OTHER): Payer: Medicare Other | Admitting: Pharmacist Clinician (PhC)/ Clinical Pharmacy Specialist

## 2017-01-07 DIAGNOSIS — I5041 Acute combined systolic (congestive) and diastolic (congestive) heart failure: Secondary | ICD-10-CM | POA: Diagnosis not present

## 2017-01-07 DIAGNOSIS — Z79891 Long term (current) use of opiate analgesic: Secondary | ICD-10-CM | POA: Diagnosis not present

## 2017-01-07 DIAGNOSIS — Z7901 Long term (current) use of anticoagulants: Secondary | ICD-10-CM

## 2017-01-07 DIAGNOSIS — N183 Chronic kidney disease, stage 3 (moderate): Secondary | ICD-10-CM | POA: Diagnosis not present

## 2017-01-07 DIAGNOSIS — Z950 Presence of cardiac pacemaker: Secondary | ICD-10-CM | POA: Diagnosis not present

## 2017-01-07 DIAGNOSIS — I739 Peripheral vascular disease, unspecified: Secondary | ICD-10-CM | POA: Diagnosis not present

## 2017-01-07 DIAGNOSIS — E785 Hyperlipidemia, unspecified: Secondary | ICD-10-CM | POA: Diagnosis not present

## 2017-01-07 DIAGNOSIS — Z5181 Encounter for therapeutic drug level monitoring: Secondary | ICD-10-CM | POA: Diagnosis not present

## 2017-01-07 DIAGNOSIS — I480 Paroxysmal atrial fibrillation: Secondary | ICD-10-CM

## 2017-01-07 DIAGNOSIS — D638 Anemia in other chronic diseases classified elsewhere: Secondary | ICD-10-CM | POA: Diagnosis not present

## 2017-01-07 DIAGNOSIS — I255 Ischemic cardiomyopathy: Secondary | ICD-10-CM | POA: Diagnosis not present

## 2017-01-07 DIAGNOSIS — Z9981 Dependence on supplemental oxygen: Secondary | ICD-10-CM | POA: Diagnosis not present

## 2017-01-07 DIAGNOSIS — I130 Hypertensive heart and chronic kidney disease with heart failure and stage 1 through stage 4 chronic kidney disease, or unspecified chronic kidney disease: Secondary | ICD-10-CM | POA: Diagnosis not present

## 2017-01-07 DIAGNOSIS — I5042 Chronic combined systolic (congestive) and diastolic (congestive) heart failure: Secondary | ICD-10-CM | POA: Diagnosis not present

## 2017-01-07 LAB — POCT INR: INR: 2.7

## 2017-01-07 NOTE — Patient Outreach (Signed)
[facility]) Care Management  01/07/2017  Clio Gerhart [birth year] [id number]   Subjective: per daughter [name]: "She seems to be doing just fine".  Objective: none  Assessment: 83 year old with recent admission 7/16-7/23 with respiratory failure due to pulmonary edema due to volume overload from acute kidney failure; Pacer placed 7/19 due to tachy brady syndrome. Client readmitted 7/31-8/2 with heart failure, atrial fibrillation, hypertension.  RNCM called for transition of care. Dara Lords (daughter), answered the phone stating that client is unavailable to come to the phone at this time, but states client was doing "fine". Client's daughter to give client message to Ms. Richardson Landry of RNCM's call.   Plan: await return call. Continue to follow-transition of care call next week.  Thea Silversmith, RN, MSN, [place] Coordinator Cell: [phone]

## 2017-01-08 ENCOUNTER — Telehealth: Payer: Self-pay | Admitting: Family Medicine

## 2017-01-08 DIAGNOSIS — N183 Chronic kidney disease, stage 3 (moderate): Secondary | ICD-10-CM | POA: Diagnosis not present

## 2017-01-08 DIAGNOSIS — Z9981 Dependence on supplemental oxygen: Secondary | ICD-10-CM | POA: Diagnosis not present

## 2017-01-08 DIAGNOSIS — Z5181 Encounter for therapeutic drug level monitoring: Secondary | ICD-10-CM | POA: Diagnosis not present

## 2017-01-08 DIAGNOSIS — E785 Hyperlipidemia, unspecified: Secondary | ICD-10-CM | POA: Diagnosis not present

## 2017-01-08 DIAGNOSIS — Z79891 Long term (current) use of opiate analgesic: Secondary | ICD-10-CM | POA: Diagnosis not present

## 2017-01-08 DIAGNOSIS — Z950 Presence of cardiac pacemaker: Secondary | ICD-10-CM | POA: Diagnosis not present

## 2017-01-08 DIAGNOSIS — D638 Anemia in other chronic diseases classified elsewhere: Secondary | ICD-10-CM | POA: Diagnosis not present

## 2017-01-08 DIAGNOSIS — I739 Peripheral vascular disease, unspecified: Secondary | ICD-10-CM | POA: Diagnosis not present

## 2017-01-08 DIAGNOSIS — I255 Ischemic cardiomyopathy: Secondary | ICD-10-CM | POA: Diagnosis not present

## 2017-01-08 DIAGNOSIS — I5042 Chronic combined systolic (congestive) and diastolic (congestive) heart failure: Secondary | ICD-10-CM | POA: Diagnosis not present

## 2017-01-08 DIAGNOSIS — I130 Hypertensive heart and chronic kidney disease with heart failure and stage 1 through stage 4 chronic kidney disease, or unspecified chronic kidney disease: Secondary | ICD-10-CM | POA: Diagnosis not present

## 2017-01-08 NOTE — Telephone Encounter (Signed)
[place] with [employer] to request pre-cert for Proccrit injections patient is scheduled to have on 01/12/17.  I advised her I did not see order/referral for this procedure and inquired who ordered it.  She states is was ordered by Dr. Anitra Lauth.  Please advise if this was indeed ordered by pcp.  If, so I will need a dx code in order to submit precert.

## 2017-01-10 ENCOUNTER — Other Ambulatory Visit: Payer: Self-pay

## 2017-01-10 ENCOUNTER — Inpatient Hospital Stay (HOSPITAL_COMMUNITY)
Admission: EM | Admit: 2017-01-10 | Discharge: 2017-02-03 | DRG: 291 | Disposition: A | Discharge status: Home Health | Payer: Medicare Other | Attending: Internal Medicine | Admitting: Internal Medicine

## 2017-01-10 ENCOUNTER — Emergency Department (HOSPITAL_COMMUNITY): Payer: Medicare Other

## 2017-01-10 ENCOUNTER — Encounter (HOSPITAL_COMMUNITY): Payer: Self-pay

## 2017-01-10 DIAGNOSIS — I428 Other cardiomyopathies: Secondary | ICD-10-CM | POA: Diagnosis present

## 2017-01-10 DIAGNOSIS — J9811 Atelectasis: Secondary | ICD-10-CM | POA: Diagnosis not present

## 2017-01-10 DIAGNOSIS — Z8673 Personal history of transient ischemic attack (TIA), and cerebral infarction without residual deficits: Secondary | ICD-10-CM

## 2017-01-10 DIAGNOSIS — E785 Hyperlipidemia, unspecified: Secondary | ICD-10-CM | POA: Diagnosis not present

## 2017-01-10 DIAGNOSIS — J440 Chronic obstructive pulmonary disease with acute lower respiratory infection: Secondary | ICD-10-CM | POA: Diagnosis present

## 2017-01-10 DIAGNOSIS — Z6820 Body mass index (BMI) 20.0-20.9, adult: Secondary | ICD-10-CM

## 2017-01-10 DIAGNOSIS — R296 Repeated falls: Secondary | ICD-10-CM | POA: Diagnosis present

## 2017-01-10 DIAGNOSIS — I130 Hypertensive heart and chronic kidney disease with heart failure and stage 1 through stage 4 chronic kidney disease, or unspecified chronic kidney disease: Secondary | ICD-10-CM | POA: Diagnosis present

## 2017-01-10 DIAGNOSIS — S0181XA Laceration without foreign body of other part of head, initial encounter: Secondary | ICD-10-CM | POA: Diagnosis present

## 2017-01-10 DIAGNOSIS — N186 End stage renal disease: Secondary | ICD-10-CM

## 2017-01-10 DIAGNOSIS — I5043 Acute on chronic combined systolic (congestive) and diastolic (congestive) heart failure: Secondary | ICD-10-CM | POA: Diagnosis present

## 2017-01-10 DIAGNOSIS — N184 Chronic kidney disease, stage 4 (severe): Secondary | ICD-10-CM | POA: Diagnosis present

## 2017-01-10 DIAGNOSIS — R0902 Hypoxemia: Secondary | ICD-10-CM

## 2017-01-10 DIAGNOSIS — W06XXXA Fall from bed, initial encounter: Secondary | ICD-10-CM | POA: Diagnosis present

## 2017-01-10 DIAGNOSIS — E440 Moderate protein-calorie malnutrition: Secondary | ICD-10-CM | POA: Diagnosis present

## 2017-01-10 DIAGNOSIS — J9621 Acute and chronic respiratory failure with hypoxia: Secondary | ICD-10-CM

## 2017-01-10 DIAGNOSIS — G8929 Other chronic pain: Secondary | ICD-10-CM | POA: Diagnosis present

## 2017-01-10 DIAGNOSIS — I132 Hypertensive heart and chronic kidney disease with heart failure and with stage 5 chronic kidney disease, or end stage renal disease: Principal | ICD-10-CM | POA: Diagnosis present

## 2017-01-10 DIAGNOSIS — Z9981 Dependence on supplemental oxygen: Secondary | ICD-10-CM

## 2017-01-10 DIAGNOSIS — F411 Generalized anxiety disorder: Secondary | ICD-10-CM | POA: Diagnosis not present

## 2017-01-10 DIAGNOSIS — Z882 Allergy status to sulfonamides status: Secondary | ICD-10-CM

## 2017-01-10 DIAGNOSIS — Q603 Renal hypoplasia, unilateral: Secondary | ICD-10-CM | POA: Diagnosis not present

## 2017-01-10 DIAGNOSIS — I5041 Acute combined systolic (congestive) and diastolic (congestive) heart failure: Secondary | ICD-10-CM | POA: Diagnosis not present

## 2017-01-10 DIAGNOSIS — F329 Major depressive disorder, single episode, unspecified: Secondary | ICD-10-CM | POA: Diagnosis present

## 2017-01-10 DIAGNOSIS — I110 Hypertensive heart disease with heart failure: Secondary | ICD-10-CM | POA: Diagnosis not present

## 2017-01-10 DIAGNOSIS — Z95828 Presence of other vascular implants and grafts: Secondary | ICD-10-CM

## 2017-01-10 DIAGNOSIS — S0990XA Unspecified injury of head, initial encounter: Secondary | ICD-10-CM | POA: Diagnosis not present

## 2017-01-10 DIAGNOSIS — Z8249 Family history of ischemic heart disease and other diseases of the circulatory system: Secondary | ICD-10-CM

## 2017-01-10 DIAGNOSIS — S2231XA Fracture of one rib, right side, initial encounter for closed fracture: Secondary | ICD-10-CM

## 2017-01-10 DIAGNOSIS — I161 Hypertensive emergency: Secondary | ICD-10-CM | POA: Diagnosis not present

## 2017-01-10 DIAGNOSIS — G253 Myoclonus: Secondary | ICD-10-CM | POA: Diagnosis not present

## 2017-01-10 DIAGNOSIS — K1230 Oral mucositis (ulcerative), unspecified: Secondary | ICD-10-CM | POA: Diagnosis not present

## 2017-01-10 DIAGNOSIS — I160 Hypertensive urgency: Secondary | ICD-10-CM | POA: Diagnosis not present

## 2017-01-10 DIAGNOSIS — T501X5A Adverse effect of loop [high-ceiling] diuretics, initial encounter: Secondary | ICD-10-CM | POA: Diagnosis not present

## 2017-01-10 DIAGNOSIS — E871 Hypo-osmolality and hyponatremia: Secondary | ICD-10-CM | POA: Diagnosis not present

## 2017-01-10 DIAGNOSIS — I5023 Acute on chronic systolic (congestive) heart failure: Secondary | ICD-10-CM | POA: Diagnosis not present

## 2017-01-10 DIAGNOSIS — I509 Heart failure, unspecified: Secondary | ICD-10-CM

## 2017-01-10 DIAGNOSIS — E876 Hypokalemia: Secondary | ICD-10-CM | POA: Diagnosis not present

## 2017-01-10 DIAGNOSIS — N261 Atrophy of kidney (terminal): Secondary | ICD-10-CM | POA: Diagnosis not present

## 2017-01-10 DIAGNOSIS — I480 Paroxysmal atrial fibrillation: Secondary | ICD-10-CM | POA: Diagnosis not present

## 2017-01-10 DIAGNOSIS — Z7901 Long term (current) use of anticoagulants: Secondary | ICD-10-CM

## 2017-01-10 DIAGNOSIS — F068 Other specified mental disorders due to known physiological condition: Secondary | ICD-10-CM | POA: Diagnosis not present

## 2017-01-10 DIAGNOSIS — R627 Adult failure to thrive: Secondary | ICD-10-CM | POA: Diagnosis not present

## 2017-01-10 DIAGNOSIS — I2510 Atherosclerotic heart disease of native coronary artery without angina pectoris: Secondary | ICD-10-CM | POA: Diagnosis present

## 2017-01-10 DIAGNOSIS — Z992 Dependence on renal dialysis: Secondary | ICD-10-CM | POA: Diagnosis not present

## 2017-01-10 DIAGNOSIS — D638 Anemia in other chronic diseases classified elsewhere: Secondary | ICD-10-CM | POA: Diagnosis present

## 2017-01-10 DIAGNOSIS — I272 Pulmonary hypertension, unspecified: Secondary | ICD-10-CM | POA: Diagnosis present

## 2017-01-10 DIAGNOSIS — N179 Acute kidney failure, unspecified: Secondary | ICD-10-CM | POA: Diagnosis not present

## 2017-01-10 DIAGNOSIS — Z885 Allergy status to narcotic agent status: Secondary | ICD-10-CM

## 2017-01-10 DIAGNOSIS — Z419 Encounter for procedure for purposes other than remedying health state, unspecified: Secondary | ICD-10-CM

## 2017-01-10 DIAGNOSIS — J189 Pneumonia, unspecified organism: Secondary | ICD-10-CM

## 2017-01-10 DIAGNOSIS — I10 Essential (primary) hypertension: Secondary | ICD-10-CM | POA: Diagnosis not present

## 2017-01-10 DIAGNOSIS — Z79899 Other long term (current) drug therapy: Secondary | ICD-10-CM

## 2017-01-10 DIAGNOSIS — G4489 Other headache syndrome: Secondary | ICD-10-CM | POA: Diagnosis not present

## 2017-01-10 DIAGNOSIS — G9341 Metabolic encephalopathy: Secondary | ICD-10-CM | POA: Diagnosis not present

## 2017-01-10 DIAGNOSIS — N183 Chronic kidney disease, stage 3 (moderate): Secondary | ICD-10-CM | POA: Diagnosis not present

## 2017-01-10 DIAGNOSIS — G4700 Insomnia, unspecified: Secondary | ICD-10-CM | POA: Diagnosis not present

## 2017-01-10 DIAGNOSIS — R0602 Shortness of breath: Secondary | ICD-10-CM | POA: Diagnosis not present

## 2017-01-10 DIAGNOSIS — N189 Chronic kidney disease, unspecified: Secondary | ICD-10-CM | POA: Diagnosis not present

## 2017-01-10 DIAGNOSIS — Z888 Allergy status to other drugs, medicaments and biological substances status: Secondary | ICD-10-CM

## 2017-01-10 DIAGNOSIS — Z950 Presence of cardiac pacemaker: Secondary | ICD-10-CM

## 2017-01-10 DIAGNOSIS — I429 Cardiomyopathy, unspecified: Secondary | ICD-10-CM | POA: Diagnosis present

## 2017-01-10 LAB — CBC WITH DIFFERENTIAL/PLATELET
Basophils Absolute: 0 10*3/uL (ref 0.0–0.1)
Basophils Relative: 0 %
EOS PCT: 2 %
Eosinophils Absolute: 0.2 10*3/uL (ref 0.0–0.7)
HCT: 31.3 % — ABNORMAL LOW (ref 36.0–46.0)
Hemoglobin: 10 g/dL — ABNORMAL LOW (ref 12.0–15.0)
LYMPHS ABS: 0.5 10*3/uL — AB (ref 0.7–4.0)
LYMPHS PCT: 5 %
MCH: 30.5 pg (ref 26.0–34.0)
MCHC: 31.9 g/dL (ref 30.0–36.0)
MCV: 95.4 fL (ref 78.0–100.0)
MONO ABS: 1.7 10*3/uL — AB (ref 0.1–1.0)
Monocytes Relative: 16 %
Neutro Abs: 8.6 10*3/uL — ABNORMAL HIGH (ref 1.7–7.7)
Neutrophils Relative %: 77 %
PLATELETS: 217 10*3/uL (ref 150–400)
RBC: 3.28 MIL/uL — ABNORMAL LOW (ref 3.87–5.11)
RDW: 15.7 % — AB (ref 11.5–15.5)
WBC: 11.1 10*3/uL — ABNORMAL HIGH (ref 4.0–10.5)

## 2017-01-10 LAB — COMPREHENSIVE METABOLIC PANEL
ALT: 17 U/L (ref 14–54)
AST: 25 U/L (ref 15–41)
Albumin: 3.6 g/dL (ref 3.5–5.0)
Alkaline Phosphatase: 59 U/L (ref 38–126)
Anion gap: 12 (ref 5–15)
BUN: 51 mg/dL — ABNORMAL HIGH (ref 6–20)
CHLORIDE: 92 mmol/L — AB (ref 101–111)
CO2: 30 mmol/L (ref 22–32)
CREATININE: 1.93 mg/dL — AB (ref 0.44–1.00)
Calcium: 8.8 mg/dL — ABNORMAL LOW (ref 8.9–10.3)
GFR, EST AFRICAN AMERICAN: 27 mL/min — AB (ref >60)
GFR, EST NON AFRICAN AMERICAN: 23 mL/min — AB (ref >60)
Glucose, Bld: 125 mg/dL — ABNORMAL HIGH (ref 65–99)
POTASSIUM: 4 mmol/L (ref 3.5–5.1)
Sodium: 134 mmol/L — ABNORMAL LOW (ref 135–145)
Total Bilirubin: 0.5 mg/dL (ref 0.3–1.2)
Total Protein: 7 g/dL (ref 6.5–8.1)

## 2017-01-10 LAB — PROTIME-INR
INR: 1.99
Prothrombin Time: 22.9 seconds — ABNORMAL HIGH (ref 11.4–15.2)

## 2017-01-10 LAB — I-STAT TROPONIN, ED: TROPONIN I, POC: 0.05 ng/mL (ref 0.00–0.08)

## 2017-01-10 LAB — BRAIN NATRIURETIC PEPTIDE: B NATRIURETIC PEPTIDE 5: 1996.8 pg/mL — AB (ref 0.0–100.0)

## 2017-01-10 MED ORDER — HYDRALAZINE HCL 20 MG/ML IJ SOLN
10.0000 mg | Freq: Once | INTRAMUSCULAR | Status: AC
Start: 2017-01-10 — End: 2017-01-10
  Administered 2017-01-10: 10 mg via INTRAVENOUS
  Filled 2017-01-10: qty 1

## 2017-01-10 MED ORDER — OXYCODONE-ACETAMINOPHEN 5-325 MG PO TABS
1.0000 | ORAL_TABLET | Freq: Once | ORAL | Status: AC
  Administered 2017-01-10: 1 via ORAL
  Filled 2017-01-10: qty 1

## 2017-01-10 MED ORDER — FENTANYL CITRATE (PF) 100 MCG/2ML IJ SOLN
50.0000 ug | Freq: Once | INTRAMUSCULAR | Status: AC
  Administered 2017-01-10: 50 ug via INTRAVENOUS
  Filled 2017-01-10: qty 2

## 2017-01-10 MED ORDER — OXYCODONE-ACETAMINOPHEN 5-325 MG PO TABS
1.0000 | ORAL_TABLET | Freq: Once | ORAL | Status: DC
  Filled 2017-01-10: qty 1

## 2017-01-10 MED ORDER — FUROSEMIDE 10 MG/ML IJ SOLN
80.0000 mg | Freq: Once | INTRAMUSCULAR | Status: AC
  Administered 2017-01-11: 80 mg via INTRAVENOUS
  Filled 2017-01-10: qty 8

## 2017-01-10 NOTE — ED Notes (Signed)
Patient transported to X-ray 

## 2017-01-10 NOTE — ED Notes (Signed)
Witness waste with caitlynn rn on percocet, after tablet opened pt refused

## 2017-01-10 NOTE — ED Notes (Addendum)
Pt reports falling 1 week ago and getting stitches over her right eye. Pt reports falling again yesterday and hurting her mid back. Pt also reports that her wound started to bleed again today and they could not get it stopped so they called 9-1-1 who brought pt to the ED.

## 2017-01-10 NOTE — ED Provider Notes (Signed)
[facility] DEPT Provider Note   CSN: [id number] Arrival date & time: 01/10/17  1821     History   Chief Complaint Chief Complaint  Patient presents with  . Wound Check  . Back Pain    HPI Valerie Terrell is a 83 y.o. female.  HPI Patient was seen on 8/16 for forehead laceration after fall. Repaired in the emergency department. Patient states that she slipped out of bed and hit her right lower ribs on the night side table yesterday. No head injury or loss of consciousness. She was concerned that this afternoon after waking up from a nap she had bleeding from her forehead laceration. She is on Coumadin for atrial fibrillation. EMS was called and placed pressure bandage.   Past Medical History:  Diagnosis Date  . Atrophy of left kidney    with absent blood flow by renal artery dopplers (Dr. Gwenlyn Found)  . 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 Chronic combined systolic and diastolic CHF (congestive heart failure) ([place])    a. 11/2016: echo showing EF of 35-40%, RV strain noted, mild MR and mild TR.   [name] Chronic renal insufficiency, stage 4 (severe) (HCC)    Baseline GFR 25 ml/min-- renal u/s showed atrophic/hypoplastic left kidney.  No hydronephrosis.  Small right renal cyst.  . History of adenomatous polyp of colon   . History of subarachnoid hemorrhage 10/2014   after syncope and while on xarelto  . Hyperlipidemia   . Hypertension    Difficult to control, in the setting of one functioning kidney: pt was referred to nephrology by Dr. Gwenlyn Found 06/2015.  . Lumbar radiculopathy 2012  . Lumbar spondylosis    MR 07/2016---no sign of spinal nerve compression or cord compression.  Pt set up with outpt ortho while admitted to hosp 07/2016.  . Metatarsal fracture 06/10/2016   Nondisplaced, left 5th metatarsal--pt was referred to ortho  . Nonischemic cardiomyopathy ([place])   . Osteopenia 2014   T-score -2.1  . PAF (paroxysmal atrial fibrillation) (HCC)    Eliquis started after BRAO  and CVA.  She was changed to warfarin 2018.  . Pulmonary edema 11/2016  . Stroke [facility])    cardioembolic (had CVA while on no anticoag)--"scattered subacute punctate infarcts: 1 in R parietal lobe and 2 in occipital cortex" on MRI br.  CT angio head/neck: aortic arch athero.   . Thoracic compression fracture ([place]) 07/2016   T7 and T8-- T8 kyphoplasty during hosp admission 07/2016.  Neuro referred pt to pain mgmt for consideration of injection 09/2016.  I referred her to endo 08/2016 for consideration of calcitonin treatment.    Patient Active Problem List   Diagnosis Date Noted  . Fracture of one rib, right side, initial encounter for closed fracture 01/11/2017  . Chronic combined systolic and diastolic heart failure ([place]) 01/05/2017  . Palpitations 12/15/2016  . Hypokalemia 12/14/2016  . Leukocytosis 12/14/2016  . Atrial fibrillation ([place]) [I48.91] 12/11/2016  . Long term (current) use of anticoagulants [Z79.01] 12/11/2016  . Tachycardia-bradycardia syndrome ([place]) - s/p PPM 12/07/2016  . Nonischemic cardiomyopathy ([place]): EF 35-40% 12/07/2016  . Acute combined systolic and diastolic heart failure ([place]) - possibly related to A. fib 12/07/2016  . Cardiac pacemaker   . Acute on chronic respiratory failure with hypoxia ([place])   . Acute pulmonary edema with congestive heart failure ([place]) 11/30/2016  . Elevated troponin level 11/30/2016  . Anemia 11/30/2016  . Chronic midline thoracic back pain 09/15/2016  . Acute CVA (cerebrovascular accident) (  [place]) 09/15/2016  . Hyperlipidemia   . Nausea & vomiting 08/05/2016  . Dehydration 08/05/2016  . AKI (acute kidney injury) ([place]) 08/05/2016  . Osteoporosis 07/28/2016  . Closed compression fracture of thoracic vertebra ([place]) 07/23/2016  . Thoracic compression fracture ([place]) 07/22/2016  . Acute renal failure superimposed on chronic kidney disease ([place]) 07/11/2016  . Flank pain 07/11/2016  . Hypoalbuminemia 07/11/2016  . Renal artery stenosis ([place])  06/24/2016  . Nondisplaced fracture of fifth metatarsal bone, left foot, initial encounter for closed fracture 06/10/2016  . Rib contusion, left, initial encounter 06/10/2016  . Single kidney 03/13/2016  . Chest pain   . Dyslipidemia   . PAF (paroxysmal atrial fibrillation) ([place]) 01/28/2016  . Essential hypertension 01/28/2016  . Cardioembolic stroke ([place]) 54/27/0623  . 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 Personal history of subarachnoid hemorrhage 01/28/2016  . CKD (chronic kidney disease), stage IV ([place]) 01/28/2016    Past Surgical History:  Procedure Laterality Date  . APPENDECTOMY  child  . CARDIOVASCULAR STRESS TEST  01/31/2016   Stress myoview: NORMAL/Low risk.  EF 56%.  . [place]  . COLONOSCOPY  2015   + hx of adenomatous polyps.  Need digest health spec in [facility] records to see when pt due for next colonoscopy  . IR GENERIC HISTORICAL  07/24/2016   IR KYPHO THORACIC WITH BONE BIOPSY 07/24/2016 Luanne Bras, MD [facility]-INTERV RAD  . KYPHOPLASTY  07/27/2016   T8  . PACEMAKER IMPLANT N/A 12/03/2016   Procedure: Pacemaker Implant;  Surgeon: Evans Lance, MD;  Location: [facility] CV LAB;  Service: Cardiovascular;  Laterality: N/A;  . Renal artery dopplers  02/26/2016   Her right renal dimension was 11 cm pole to pole with mild to moderate right renal artery stenosis. A right renal aortic ratio was 3.22 suggesting less than a 50% stenosis.  . TONSILLECTOMY    . TRANSTHORACIC ECHOCARDIOGRAM  01/29/2016; 11/2016   2017: EF 55-60%, normal LV wall motion, grade I DD.  No cardiac source of emboli was seen.  2018: EF 35-40%, could not assess DD due to a-fib, pulm HTN noted.    OB History    No data available       Home Medications    Prior to Admission medications   Medication Sig Start Date End Date Taking? Authorizing Provider  acetaminophen (TYLENOL) 500 MG tablet Take 500 mg by mouth every 6 (six) hours as needed for moderate pain.     [provider]  amiodarone (PACERONE) 200 MG tablet Take 1 tablet (200 mg total) by mouth 2 (two) times daily. 01/05/17   Strader, Fransisco Hertz, PA-C  amLODipine (NORVASC) 5 MG tablet Take 1 tablet (5 mg total) by mouth daily. 01/05/17 02/04/17  Erma Heritage, PA-C  Calcium Carb-Cholecalciferol (CALCIUM/VITAMIN D PO) Take 1 tablet by mouth 2 (two) times daily.    [provider]  carvedilol (COREG) 25 MG tablet Take 1 tablet (25 mg total) by mouth 2 (two) times daily with a meal. 12/17/16   Velvet Bathe, MD  furosemide (LASIX) 80 MG tablet Take 2 tablets (160 mg total) by mouth 2 (two) times daily. 12/07/16 01/06/17  Irwin Brakeman L, MD  hydrALAZINE (APRESOLINE) 100 MG tablet Take 1 tablet (100 mg total) by mouth 3 (three) times daily. 08/12/16   McGowen, Adrian Blackwater, MD  ibandronate (BONIVA) 150 MG tablet Take 1 tablet (150 mg total) by mouth every 30 (thirty) days. Take in the morning with  a full glass of water, on an empty stomach, and do not take anything else by mouth or lie down for the next 30 min. 08/12/16   McGowen, Adrian Blackwater, MD  isosorbide mononitrate (IMDUR) 60 MG 24 hr tablet Take 1 tablet (60 mg total) by mouth daily. 12/18/16   Velvet Bathe, MD  Multiple Vitamin (MULTIVITAMIN) tablet Take 1 tablet by mouth 3 (three) times a week.     [provider]  Nutritional Supplements (CARNATION INSTANT BREAKFAST PO) Take by mouth.    [provider]  ondansetron (ZOFRAN) 4 MG tablet Take 0.5-1 tablets (2-4 mg total) by mouth every 8 (eight) hours as needed for nausea or vomiting. 12/10/16   McGowen, Adrian Blackwater, MD  oxyCODONE-acetaminophen (PERCOCET/ROXICET) 5-325 MG tablet Take 1-2 tablets by mouth every 6 (six) hours as needed for severe pain. 12/31/16   McGowen, Adrian Blackwater, MD  Potassium Chloride ER 20 MEQ TBCR Take 20 mEq by mouth 2 (two) times daily. 12/31/16   McGowen, Adrian Blackwater, MD  pravastatin (PRAVACHOL) 40 MG tablet Take 40 mg by mouth every evening. 07/16/15    [provider]  vitamin C (ASCORBIC ACID) 500 MG tablet Take 500 mg by mouth daily.    [provider]  Vitamin D, Ergocalciferol, (DRISDOL) 50000 units CAPS capsule 1 cap po q week x 12 weeks Patient taking differently: Take 50,000 Units by mouth every 7 (seven) days. For 12 weeks. (Thursdays) 11/09/16   McGowen, Adrian Blackwater, MD  warfarin (COUMADIN) 3 MG tablet Take 1/2 to 1 tablet by mouth daily as directed by coumadin clinic 12/21/16   Lorretta Harp, MD    Family History Family History  Problem Relation Age of Onset  . Cancer Mother   . Heart disease Father   . Early death Father   . Sudden Cardiac Death Neg Hx     Social History Social History  Substance Use Topics  . Smoking status: Never Smoker  . Smokeless tobacco: Never Used  . Alcohol use 0.6 oz/week    1 Glasses of wine per week     Comment: wine     Allergies   Clonidine derivatives; Codeine; Nickel; and Sulfa antibiotics   Review of Systems Review of Systems  Constitutional: Negative for chills and fever.  Eyes: Negative for visual disturbance.  Respiratory: Negative for shortness of breath.   Cardiovascular: Negative for chest pain.  Gastrointestinal: Negative for abdominal pain, diarrhea, nausea and vomiting.  Musculoskeletal: Positive for back pain. Negative for neck pain and neck stiffness.  Skin: Positive for wound. Negative for rash.  Neurological: Negative for dizziness, weakness, light-headedness and numbness.  All other systems reviewed and are negative.    Physical Exam Updated Vital Signs BP (!) 163/79   Pulse 81   Temp 98.6 F (37 C) (Oral)   Resp (!) 23   SpO2 96%   Physical Exam  Constitutional: She is oriented to person, place, and time. She appears well-developed and well-nourished. No distress.  HENT:  Head: Normocephalic.  Mouth/Throat: Oropharynx is clear and moist.  Patient with 2 cm laceration to the right forehead. There is no active bleeding. Sutures are  intact.  Eyes: Pupils are equal, round, and reactive to light. EOM are normal.  Neck: Normal range of motion. Neck supple.  No posterior midline cervical tenderness to palpation.  Cardiovascular: Normal rate and regular rhythm.  Exam reveals no gallop and no friction rub.   No murmur heard. Pulmonary/Chest: Effort normal. No respiratory distress.  She has no wheezes. She has rales. She exhibits no tenderness.  Scattered rales. No respiratory distress.  Abdominal: Soft. Bowel sounds are normal. There is no tenderness. There is no rebound and no guarding.  Musculoskeletal: Normal range of motion. She exhibits edema. She exhibits no tenderness.  2+ bilateral lower extremity edema. Patient has tears to palpation posteriorly and laterally over the right inferior rib. No midline thoracic or lumbar tenderness. No hip pain with range of motion. Distal pulses are 3+.  Neurological: She is alert and oriented to person, place, and time.  Moving all extremities without focal deficit. Sensation fully intact.  Skin: Skin is warm and dry. No rash noted. She is not diaphoretic. No erythema.  Psychiatric: She has a normal mood and affect. Her behavior is normal.  Nursing note and vitals reviewed.    ED Treatments / Results  Labs (all labs ordered are listed, but only abnormal results are displayed) Labs Reviewed  CBC WITH DIFFERENTIAL/PLATELET - Abnormal; Notable for the following:       Result Value   WBC 11.1 (*)    RBC 3.28 (*)    Hemoglobin 10.0 (*)    HCT 31.3 (*)    RDW 15.7 (*)    Neutro Abs 8.6 (*)    Lymphs Abs 0.5 (*)    Monocytes Absolute 1.7 (*)    All other components within normal limits  COMPREHENSIVE METABOLIC PANEL - Abnormal; Notable for the following:    Sodium 134 (*)    Chloride 92 (*)    Glucose, Bld 125 (*)    BUN 51 (*)    Creatinine, Ser 1.93 (*)    Calcium 8.8 (*)    GFR calc non Af Amer 23 (*)    GFR calc Af Amer 27 (*)    All other components within normal limits    BRAIN NATRIURETIC PEPTIDE - Abnormal; Notable for the following:    B Natriuretic Peptide 1,996.8 (*)    All other components within normal limits  PROTIME-INR - Abnormal; Notable for the following:    Prothrombin Time 22.9 (*)    All other components within normal limits  I-STAT TROPONIN, ED    EKG  EKG Interpretation None       Radiology Dg Ribs Unilateral W/chest Right  Result Date: 01/10/2017 CLINICAL DATA:  Recent fall with right chest pain, initial encounter EXAM: RIGHT RIBS AND CHEST - 3+ VIEW COMPARISON:  12/14/2016 FINDINGS: Cardiac shadow is mildly enlarged. Aortic calcifications are again seen. Chronic changes in the left base are seen but improved. Changes of prior vertebral augmentation are noted. Old rib fractures are noted on the right. Undisplaced right ninth rib fracture is noted. No other focal bony abnormality is seen. IMPRESSION: Undisplaced right ninth rib fracture without complicating factors. Multiple old right rib fractures with healing. Electronically Signed   By: Inez Catalina M.D.   On: 01/10/2017 21:19    Procedures Procedures (including critical care time)  Medications Ordered in ED Medications  oxyCODONE-acetaminophen (PERCOCET/ROXICET) 5-325 MG per tablet 1 tablet (1 tablet Oral Refused 01/10/17 2144)  furosemide (LASIX) injection 80 mg (not administered)  oxyCODONE-acetaminophen (PERCOCET/ROXICET) 5-325 MG per tablet 1 tablet (1 tablet Oral Given 01/10/17 2030)  hydrALAZINE (APRESOLINE) injection 10 mg (10 mg Intravenous Given 01/10/17 2203)  fentaNYL (SUBLIMAZE) injection 50 mcg (50 mcg Intravenous Given 01/10/17 2202)     Initial Impression / Assessment and Plan / ED Course  I have reviewed the triage vital signs and the nursing  notes.  Pertinent labs & imaging results that were available during my care of the patient were reviewed by me and considered in my medical decision making (see chart for details).    Patient with increasing shortness of  breath after x-ray. Normally on 3 L of home O2. Desaturations into the 80s.   Patient appears much more comfortable. Blood pressure has improved. Will switch back to her normal oxygen via nasal cannula.  Discussed with Dr.Opyd who will admit patient for observation and diuresis. Final Clinical Impressions(s) / ED Diagnoses   Final diagnoses:  Closed fracture of one rib of right side, initial encounter  Acute on chronic systolic congestive heart failure ([place])  Hypertensive urgency    New Prescriptions New Prescriptions   No medications on file     Julianne Rice, MD 01/11/17 0001

## 2017-01-10 NOTE — ED Triage Notes (Addendum)
To triage via EMS.  Pt fell 1 week ago while in [place], hit forehead on bathtub, had to suture lac.  Onset today pt was picking at scab and bleeding started, family could not control bleeding.  Pt fell yesterday and is c/o pain to right lower back

## 2017-01-10 NOTE — ED Notes (Signed)
Pt placed on nonrebreather and physician notified of desaturation

## 2017-01-11 ENCOUNTER — Encounter (HOSPITAL_COMMUNITY): Payer: Self-pay | Admitting: Family Medicine

## 2017-01-11 ENCOUNTER — Other Ambulatory Visit (HOSPITAL_COMMUNITY): Payer: Self-pay | Admitting: *Deleted

## 2017-01-11 DIAGNOSIS — I10 Essential (primary) hypertension: Secondary | ICD-10-CM | POA: Diagnosis not present

## 2017-01-11 DIAGNOSIS — G9341 Metabolic encephalopathy: Secondary | ICD-10-CM | POA: Diagnosis not present

## 2017-01-11 DIAGNOSIS — I5043 Acute on chronic combined systolic (congestive) and diastolic (congestive) heart failure: Secondary | ICD-10-CM | POA: Diagnosis not present

## 2017-01-11 DIAGNOSIS — Z992 Dependence on renal dialysis: Secondary | ICD-10-CM | POA: Diagnosis not present

## 2017-01-11 DIAGNOSIS — E440 Moderate protein-calorie malnutrition: Secondary | ICD-10-CM | POA: Diagnosis present

## 2017-01-11 DIAGNOSIS — J440 Chronic obstructive pulmonary disease with acute lower respiratory infection: Secondary | ICD-10-CM | POA: Diagnosis present

## 2017-01-11 DIAGNOSIS — I480 Paroxysmal atrial fibrillation: Secondary | ICD-10-CM | POA: Diagnosis not present

## 2017-01-11 DIAGNOSIS — N183 Chronic kidney disease, stage 3 (moderate): Secondary | ICD-10-CM | POA: Diagnosis not present

## 2017-01-11 DIAGNOSIS — R0602 Shortness of breath: Secondary | ICD-10-CM | POA: Diagnosis not present

## 2017-01-11 DIAGNOSIS — S2231XA Fracture of one rib, right side, initial encounter for closed fracture: Secondary | ICD-10-CM

## 2017-01-11 DIAGNOSIS — I130 Hypertensive heart and chronic kidney disease with heart failure and stage 1 through stage 4 chronic kidney disease, or unspecified chronic kidney disease: Secondary | ICD-10-CM | POA: Diagnosis present

## 2017-01-11 DIAGNOSIS — R627 Adult failure to thrive: Secondary | ICD-10-CM | POA: Diagnosis present

## 2017-01-11 DIAGNOSIS — E8770 Fluid overload, unspecified: Secondary | ICD-10-CM | POA: Diagnosis not present

## 2017-01-11 DIAGNOSIS — J9621 Acute and chronic respiratory failure with hypoxia: Secondary | ICD-10-CM

## 2017-01-11 DIAGNOSIS — W06XXXA Fall from bed, initial encounter: Secondary | ICD-10-CM | POA: Diagnosis present

## 2017-01-11 DIAGNOSIS — G4700 Insomnia, unspecified: Secondary | ICD-10-CM | POA: Diagnosis not present

## 2017-01-11 DIAGNOSIS — I160 Hypertensive urgency: Secondary | ICD-10-CM | POA: Diagnosis present

## 2017-01-11 DIAGNOSIS — D638 Anemia in other chronic diseases classified elsewhere: Secondary | ICD-10-CM | POA: Diagnosis present

## 2017-01-11 DIAGNOSIS — S0181XA Laceration without foreign body of other part of head, initial encounter: Secondary | ICD-10-CM | POA: Diagnosis present

## 2017-01-11 DIAGNOSIS — I5041 Acute combined systolic (congestive) and diastolic (congestive) heart failure: Secondary | ICD-10-CM | POA: Diagnosis not present

## 2017-01-11 DIAGNOSIS — I5023 Acute on chronic systolic (congestive) heart failure: Secondary | ICD-10-CM | POA: Diagnosis not present

## 2017-01-11 DIAGNOSIS — I132 Hypertensive heart and chronic kidney disease with heart failure and with stage 5 chronic kidney disease, or end stage renal disease: Secondary | ICD-10-CM | POA: Diagnosis not present

## 2017-01-11 DIAGNOSIS — E876 Hypokalemia: Secondary | ICD-10-CM | POA: Diagnosis not present

## 2017-01-11 DIAGNOSIS — F068 Other specified mental disorders due to known physiological condition: Secondary | ICD-10-CM | POA: Diagnosis not present

## 2017-01-11 DIAGNOSIS — Q603 Renal hypoplasia, unilateral: Secondary | ICD-10-CM | POA: Diagnosis not present

## 2017-01-11 DIAGNOSIS — I428 Other cardiomyopathies: Secondary | ICD-10-CM | POA: Diagnosis not present

## 2017-01-11 DIAGNOSIS — N179 Acute kidney failure, unspecified: Secondary | ICD-10-CM | POA: Diagnosis not present

## 2017-01-11 DIAGNOSIS — E871 Hypo-osmolality and hyponatremia: Secondary | ICD-10-CM | POA: Diagnosis not present

## 2017-01-11 DIAGNOSIS — N189 Chronic kidney disease, unspecified: Secondary | ICD-10-CM | POA: Diagnosis not present

## 2017-01-11 DIAGNOSIS — J189 Pneumonia, unspecified organism: Secondary | ICD-10-CM | POA: Diagnosis not present

## 2017-01-11 DIAGNOSIS — N186 End stage renal disease: Secondary | ICD-10-CM | POA: Diagnosis not present

## 2017-01-11 DIAGNOSIS — N184 Chronic kidney disease, stage 4 (severe): Secondary | ICD-10-CM | POA: Diagnosis not present

## 2017-01-11 DIAGNOSIS — E785 Hyperlipidemia, unspecified: Secondary | ICD-10-CM | POA: Diagnosis not present

## 2017-01-11 DIAGNOSIS — F411 Generalized anxiety disorder: Secondary | ICD-10-CM | POA: Diagnosis present

## 2017-01-11 DIAGNOSIS — D631 Anemia in chronic kidney disease: Secondary | ICD-10-CM | POA: Diagnosis not present

## 2017-01-11 DIAGNOSIS — I509 Heart failure, unspecified: Secondary | ICD-10-CM | POA: Diagnosis not present

## 2017-01-11 DIAGNOSIS — I700 Atherosclerosis of aorta: Secondary | ICD-10-CM | POA: Diagnosis not present

## 2017-01-11 DIAGNOSIS — J9811 Atelectasis: Secondary | ICD-10-CM | POA: Diagnosis present

## 2017-01-11 DIAGNOSIS — N261 Atrophy of kidney (terminal): Secondary | ICD-10-CM | POA: Diagnosis present

## 2017-01-11 LAB — PROTIME-INR
INR: 1.95
PROTHROMBIN TIME: 22.5 s — AB (ref 11.4–15.2)

## 2017-01-11 MED ORDER — CARVEDILOL 25 MG PO TABS
25.0000 mg | ORAL_TABLET | Freq: Two times a day (BID) | ORAL | Status: DC
  Administered 2017-01-11 – 2017-02-03 (×44): 25 mg via ORAL
  Filled 2017-01-11 (×6): qty 1
  Filled 2017-01-11: qty 2
  Filled 2017-01-11 (×12): qty 1
  Filled 2017-01-11: qty 2
  Filled 2017-01-11 (×17): qty 1
  Filled 2017-01-11: qty 2
  Filled 2017-01-11 (×11): qty 1

## 2017-01-11 MED ORDER — SODIUM CHLORIDE 0.9% FLUSH: 3.0000 mL | INTRAVENOUS | Status: DC | PRN

## 2017-01-11 MED ORDER — OXYCODONE-ACETAMINOPHEN 5-325 MG PO TABS
1.0000 | ORAL_TABLET | Freq: Four times a day (QID) | ORAL | Status: DC | PRN
  Administered 2017-01-11: 1 via ORAL
  Administered 2017-01-12 – 2017-01-13 (×3): 2 via ORAL
  Administered 2017-01-13 (×3): 1 via ORAL
  Administered 2017-01-15: 2 via ORAL
  Filled 2017-01-11 (×2): qty 2
  Filled 2017-01-11: qty 1
  Filled 2017-01-11 (×2): qty 2
  Filled 2017-01-11 (×2): qty 1
  Filled 2017-01-11: qty 2

## 2017-01-11 MED ORDER — PRAVASTATIN SODIUM 40 MG PO TABS
40.0000 mg | ORAL_TABLET | Freq: Every day | ORAL | Status: DC
  Administered 2017-01-11 – 2017-01-12 (×2): 40 mg via ORAL
  Filled 2017-01-11 (×2): qty 1

## 2017-01-11 MED ORDER — CALCIUM CARBONATE-VITAMIN D 500-200 MG-UNIT PO TABS
1.0000 | ORAL_TABLET | Freq: Two times a day (BID) | ORAL | Status: DC
  Administered 2017-01-11 – 2017-02-03 (×37): 1 via ORAL
  Filled 2017-01-11 (×43): qty 1

## 2017-01-11 MED ORDER — ISOSORBIDE MONONITRATE ER 60 MG PO TB24
60.0000 mg | ORAL_TABLET | Freq: Every day | ORAL | Status: DC
  Administered 2017-01-11 – 2017-02-03 (×24): 60 mg via ORAL
  Filled 2017-01-11 (×4): qty 1
  Filled 2017-01-11: qty 2
  Filled 2017-01-11 (×22): qty 1

## 2017-01-11 MED ORDER — MORPHINE SULFATE (PF) 4 MG/ML IV SOLN
1.0000 mg | INTRAVENOUS | Status: DC | PRN
  Administered 2017-01-11: 3 mg via INTRAVENOUS
  Administered 2017-01-11 – 2017-01-13 (×3): 2 mg via INTRAVENOUS
  Filled 2017-01-11 (×4): qty 1

## 2017-01-11 MED ORDER — SODIUM CHLORIDE 0.9% FLUSH
3.0000 mL | Freq: Two times a day (BID) | INTRAVENOUS | Status: DC
  Administered 2017-01-11 – 2017-02-02 (×42): 3 mL via INTRAVENOUS

## 2017-01-11 MED ORDER — ALPRAZOLAM 0.25 MG PO TABS
0.2500 mg | ORAL_TABLET | Freq: Two times a day (BID) | ORAL | Status: DC | PRN
  Administered 2017-01-11 – 2017-01-13 (×3): 0.25 mg via ORAL
  Filled 2017-01-11 (×3): qty 1

## 2017-01-11 MED ORDER — WARFARIN SODIUM 3 MG PO TABS
3.0000 mg | ORAL_TABLET | Freq: Once | ORAL | Status: AC
  Administered 2017-01-11: 3 mg via ORAL
  Filled 2017-01-11: qty 1

## 2017-01-11 MED ORDER — ADULT MULTIVITAMIN W/MINERALS CH
1.0000 | ORAL_TABLET | ORAL | Status: DC
  Administered 2017-01-11 – 2017-02-01 (×6): 1 via ORAL
  Filled 2017-01-11 (×5): qty 1

## 2017-01-11 MED ORDER — ONDANSETRON HCL 4 MG/2ML IJ SOLN
4.0000 mg | Freq: Four times a day (QID) | INTRAMUSCULAR | Status: DC | PRN
  Administered 2017-01-11 – 2017-01-24 (×6): 4 mg via INTRAVENOUS
  Filled 2017-01-11 (×6): qty 2

## 2017-01-11 MED ORDER — AMLODIPINE BESYLATE 5 MG PO TABS
5.0000 mg | ORAL_TABLET | Freq: Every day | ORAL | Status: DC
  Administered 2017-01-11 – 2017-02-03 (×23): 5 mg via ORAL
  Filled 2017-01-11 (×25): qty 1

## 2017-01-11 MED ORDER — VITAMIN C 500 MG PO TABS
500.0000 mg | ORAL_TABLET | Freq: Every day | ORAL | Status: DC
  Administered 2017-01-11 – 2017-02-03 (×20): 500 mg via ORAL
  Filled 2017-01-11 (×24): qty 1

## 2017-01-11 MED ORDER — FUROSEMIDE 10 MG/ML IJ SOLN
60.0000 mg | Freq: Two times a day (BID) | INTRAMUSCULAR | Status: DC
  Administered 2017-01-11 – 2017-01-12 (×3): 60 mg via INTRAVENOUS
  Filled 2017-01-11 (×3): qty 6

## 2017-01-11 MED ORDER — SODIUM CHLORIDE 0.9 % IV SOLN
250.0000 mL | INTRAVENOUS | Status: DC | PRN
  Administered 2017-01-29: 09:00:00 via INTRAVENOUS

## 2017-01-11 MED ORDER — HYDRALAZINE HCL 50 MG PO TABS
100.0000 mg | ORAL_TABLET | Freq: Three times a day (TID) | ORAL | Status: DC
  Administered 2017-01-11 – 2017-02-03 (×60): 100 mg via ORAL
  Filled 2017-01-11 (×68): qty 2

## 2017-01-11 MED ORDER — SODIUM CHLORIDE 0.9 % IV SOLN
510.0000 mg | INTRAVENOUS | Status: AC
  Administered 2017-01-12 – 2017-01-19 (×2): 510 mg via INTRAVENOUS
  Filled 2017-01-11 (×4): qty 17

## 2017-01-11 MED ORDER — POTASSIUM CHLORIDE CRYS ER 20 MEQ PO TBCR
20.0000 meq | EXTENDED_RELEASE_TABLET | Freq: Two times a day (BID) | ORAL | Status: DC
  Administered 2017-01-11 – 2017-01-14 (×7): 20 meq via ORAL
  Filled 2017-01-11 (×7): qty 1

## 2017-01-11 MED ORDER — ACETAMINOPHEN 325 MG PO TABS
650.0000 mg | ORAL_TABLET | ORAL | Status: DC | PRN
  Administered 2017-01-14 – 2017-01-27 (×8): 650 mg via ORAL
  Filled 2017-01-11 (×9): qty 2

## 2017-01-11 MED ORDER — WARFARIN - PHARMACIST DOSING INPATIENT
Freq: Every day | Status: DC
  Administered 2017-01-16 – 2017-01-17 (×2)
  Administered 2017-01-19 – 2017-01-20 (×2): 1
  Administered 2017-01-22 – 2017-01-26 (×3)

## 2017-01-11 MED ORDER — CALCIUM/VITAMIN D 600-400 MG-UNIT PO TABS: 1.0000 | ORAL_TABLET | Freq: Two times a day (BID) | ORAL | Status: DC

## 2017-01-11 MED ORDER — AMIODARONE HCL 200 MG PO TABS
200.0000 mg | ORAL_TABLET | Freq: Two times a day (BID) | ORAL | Status: DC
  Administered 2017-01-11 – 2017-01-18 (×15): 200 mg via ORAL
  Filled 2017-01-11 (×15): qty 1

## 2017-01-11 MED ORDER — LIDOCAINE 5 % EX PTCH
1.0000 | MEDICATED_PATCH | CUTANEOUS | Status: DC
  Administered 2017-01-11: 1 via TRANSDERMAL
  Filled 2017-01-11: qty 1

## 2017-01-11 MED ORDER — WARFARIN SODIUM 3 MG PO TABS
3.0000 mg | ORAL_TABLET | Freq: Once | ORAL | Status: AC
  Administered 2017-01-11: 3 mg via ORAL
  Filled 2017-01-11 (×2): qty 1

## 2017-01-11 NOTE — Progress Notes (Signed)
[facility]  Patient Status: Active (receiving services up to time of hospitalization)  [facility] is providing the following services: RN and PT  If patient discharges after hours, please call [phone].   Valerie Terrell 01/11/2017, 4:46 PM

## 2017-01-11 NOTE — Telephone Encounter (Signed)
SW pts daughter and she stated that pt is back in the hospital she had a fall Saturday and broke a few of her ribs. She stated that pt is in a lot of pain and she feels like the staff there doesn't understand how much pain pt is in. I asked her about the proccrit injections and she stated that pts nephrologist ordered it so they will contact them once pt is doing better.

## 2017-01-11 NOTE — ED Notes (Signed)
Pt given medication with applesauce.

## 2017-01-11 NOTE — ED Notes (Signed)
Update given to pt and daughter.

## 2017-01-11 NOTE — Progress Notes (Signed)
ANTICOAGULATION CONSULT NOTE - Initial Consult  Pharmacy Consult for warfarin  Indication: atrial fibrillation  Allergies  Allergen Reactions  . Clonidine Derivatives Palpitations and Other (See Comments)    Very sedated  . Codeine Nausea Only  . Nickel Rash  . Sulfa Antibiotics Other (See Comments)    Reaction unknown    Vital Signs: Temp: 98.6 F (37 C) (08/26 1942) Temp Source: Oral (08/26 1942) BP: 163/79 (08/26 2345) Pulse Rate: 81 (08/26 2345)  Labs:  Recent Labs  01/10/17 2203  HGB 10.0*  HCT 31.3*  PLT 217  LABPROT 22.9*  INR 1.99  CREATININE 1.93*    Estimated Creatinine Clearance: 16.9 mL/min (A) (by C-G formula based on SCr of 1.93 mg/dL (H)).   Medical History: Past Medical History:  Diagnosis Date  . Atrophy of left kidney    with absent blood flow by renal artery dopplers (Dr. Gwenlyn Found)  . 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 Chronic combined systolic and diastolic CHF (congestive heart failure) ([place])    a. 11/2016: echo showing EF of 35-40%, RV strain noted, mild MR and mild TR.   [name] Chronic renal insufficiency, stage 4 (severe) (HCC)    Baseline GFR 25 ml/min-- renal u/s showed atrophic/hypoplastic left kidney.  No hydronephrosis.  Small right renal cyst.  . History of adenomatous polyp of colon   . History of subarachnoid hemorrhage 10/2014   after syncope and while on xarelto  . Hyperlipidemia   . Hypertension    Difficult to control, in the setting of one functioning kidney: pt was referred to nephrology by Dr. Gwenlyn Found 06/2015.  . Lumbar radiculopathy 2012  . Lumbar spondylosis    MR 07/2016---no sign of spinal nerve compression or cord compression.  Pt set up with outpt ortho while admitted to hosp 07/2016.  . Metatarsal fracture 06/10/2016   Nondisplaced, left 5th metatarsal--pt was referred to ortho  . Nonischemic cardiomyopathy ([place])   . Osteopenia 2014   T-score -2.1  . PAF (paroxysmal atrial fibrillation) (HCC)    Eliquis  started after BRAO and CVA.  She was changed to warfarin 2018.  . Pulmonary edema 11/2016  . Stroke [facility])    cardioembolic (had CVA while on no anticoag)--"scattered subacute punctate infarcts: 1 in R parietal lobe and 2 in occipital cortex" on MRI br.  CT angio head/neck: aortic arch athero.   . Thoracic compression fracture ([place]) 07/2016   T7 and T8-- T8 kyphoplasty during hosp admission 07/2016.  Neuro referred pt to pain mgmt for consideration of injection 09/2016.  I referred her to endo 08/2016 for consideration of calcitonin treatment.   Assessment: 83 yo female admitted with wound check for head laceration. Patient is on warfarin PTA for Afib; pharmacy consulted to continue.   INR is just slightly subtherapeutic at 1.99 this evening and her last dose of warfarin was 8/25 at [facility]. CBC is stable and RN reports no active bleeding.    PTA dose: 1.5mg  MWF and 3mg  all other days   Goal of Therapy:  INR 2-3 Monitor platelets by anticoagulation protocol: Yes   Plan:  Warfarin 3mg  PO x1 now INR daily and CBC as needed Monitor for additional s/s bleeding  Argie Ramming, PharmD Clinical Pharmacist 01/11/17 12:31 AM

## 2017-01-11 NOTE — Progress Notes (Addendum)
PROGRESS NOTE   Valerie Terrell  PXT:[id number]    DOB: 11/30/1932    DOA: 01/10/2017  PCP: Tammi Sou, MD   I have briefly reviewed patients previous medical records in [facility].  Brief Narrative:  83 year old female, ambulates with the help of a cane/walker, daughter lives with her, chronic combined diastolic and systolic CHF, NICM, stage IV chronic kidney disease, CAD, PAF on warfarin, home oxygen 3-4 L/m, HLD, HTN, CVA, presented to the ED on 01/11/17 for evaluation of bleeding head wound and right back pain after a fall. She had been seen in the ED a week ago after falling and striking her head at which time the laceration was closed with suture but patient apparently started picking at it and then had difficulty controlling bleeding. She also sustained a mechanical ground-level fall at home and reported right sided back pain worse with chest wall movements or deep inspiration. In the ED, hypoxic at 88% on her supplemental oxygen, tachypneic, mildly hypertensive, chest x-ray showed nondisplaced right ninth rib fracture without complicating features as well as multiple old right rib fractures with healing. She was briefly placed on nonrebreather mask. Admitted for acute on chronic hypoxic respiratory failure secondary to acute on chronic systolic and diastolic CHF and chest wall splinting related to pain from rib fracture.   Assessment & Plan:   Principal Problem:   Acute on chronic respiratory failure with hypoxia (HCC) Active Problems:   PAF (paroxysmal atrial fibrillation) (HCC)   Essential hypertension   CKD (chronic kidney disease), stage IV (HCC)   Acute combined systolic and diastolic heart failure (HCC) - possibly related to A. fib   Fracture of one rib, right side, initial encounter for closed fracture   1. Acute on chronic hypoxic respiratory failure: Patient on home oxygen 3-4 L/m apparently for the last 1 month. Presented with oxygen saturations in the 80s. Suspect  due to acute on chronic CHF and chest wall splinting from rib fracture pain. Treat underlying cause, incentive spirometry and titrate oxygen for saturations >92%. Patient was back on 4 L/m nasal cannula oxygen in the ED. 2. Acute on chronic systolic and diastolic CHF: 2-D echo 11/11/01: LVEF 35-40 percent and moderate pulmonary hypertension. IV Lasix 60 mg every 12 hours initiated in ED, continue for additional 24 hours and reassess in a.m. Patient reports weight gain from 106 pounds 06/18/2010 pounds. Continue carvedilol and hydralazine. Negative for 97 mL since admission. Sees Dr. Gwenlyn Found, Cardiology 3. Fall with rib fracture/chest pain: Chest x-ray shows a nondisplaced right ninth rib fracture without complicating features and multiple old right rib fractures with healing. Her chest pain is likely related to this. Pain control, we will add lidocaine patch. Incentive spirometry. Monitor. PT and OT evaluation. 4. Paroxysmal atrial fibrillation: CHADS-VASc is 47 (age x2, gender, CHF, HTN)  sinus rhythm on admission. Continue Coumadin per pharmacy. INR slightly subtherapeutic, 1.99. Continue amiodarone, carvedilol. 5. Stage IV chronic kidney disease: Presented with creatinine of 1.93, likely her baseline. Follow BMP while on IV diuretics. 6. Essential hypertension: Mildly uncontrolled. Continue carvedilol, amlodipine, Imdur and hydralazine. When necessary IV hydralazine. 7. Anemia: Chronic and stable. 8. CAD: Chest pain is musculoskeletal and not ischemic in nature. Continue cardiac meds as above.   DVT prophylaxis: Coumadin per pharmacy Code Status: Full Family Communication: None at bedside Disposition: DC home when medically improved   Consultants:  None   Procedures:  None  Antimicrobials:  None    Subjective: Seen this morning in the ED.  Mild dyspnea and breathing not yet at baseline. Painful inspiration and chest wall movements, indicates right lower rib cage, laterally and posteriorly.  No further bleeding from forehead wound.  ROS: No dizziness or lightheadedness reported.  Objective:  Vitals:   01/11/17 1134 01/11/17 1200 01/11/17 1300 01/11/17 1436  BP: (!) 147/69 (!) 167/63 (!) 162/67 126/67  Pulse:  63 68 66  Resp:  13 18 18   Temp:    98.1 F (36.7 C)  TempSrc:    Oral  SpO2:  100% 98% 98%  Weight:    50.9 kg (112 lb 3.1 oz)  Height:    4\' 11"  (1.499 m)    Examination:  General exam: Pleasant elderly female, small built, frail, chronically looking, lying comfortably propped up in bed without distress. Respiratory system: Diminished breath sounds in the bases with few bibasal crackles. Rest of lung fields clear to auscultation. No wheezing or rhonchi. Poor inspiratory effort/splinting due to pain. Respiratory effort normal. Tender to palpation over right lower posterior rib cage. Cardiovascular system: S1 & S2 heard, RRR. No JVD, murmurs, rubs, gallops or clicks. 1+ pedal edema. Gastrointestinal system: Abdomen is nondistended, soft and nontender. No organomegaly or masses felt. Normal bowel sounds heard. Central nervous system: Alert and oriented. No focal neurological deficits. Extremities: Symmetric 5 x 5 power. Skin: No rashes, lesions or ulcers. Forehead wound not actively bleeding. Psychiatry: Judgement and insight appear normal. Mood & affect appropriate.     Data Reviewed: I have personally reviewed following labs and imaging studies  CBC:  Recent Labs Lab 01/10/17 2203  WBC 11.1*  NEUTROABS 8.6*  HGB 10.0*  HCT 31.3*  MCV 95.4  PLT 737   Basic Metabolic Panel:  Recent Labs Lab 01/10/17 2203  NA 134*  K 4.0  CL 92*  CO2 30  GLUCOSE 125*  BUN 51*  CREATININE 1.93*  CALCIUM 8.8*   Liver Function Tests:  Recent Labs Lab 01/10/17 2203  AST 25  ALT 17  ALKPHOS 59  BILITOT 0.5  PROT 7.0  ALBUMIN 3.6   Coagulation Profile:  Recent Labs Lab 01/07/17 01/10/17 2203 01/11/17 0117  INR 2.7 1.99 1.95   Cardiac  Enzymes: No results for input(s): CKTOTAL, CKMB, CKMBINDEX, TROPONINI in the last 168 hours. HbA1C: No results for input(s): HGBA1C in the last 72 hours. CBG: No results for input(s): GLUCAP in the last 168 hours.  No results found for this or any previous visit (from the past 240 hour(s)).       Radiology Studies: Dg Ribs Unilateral W/chest Right  Result Date: 01/10/2017 CLINICAL DATA:  Recent fall with right chest pain, initial encounter EXAM: RIGHT RIBS AND CHEST - 3+ VIEW COMPARISON:  12/14/2016 FINDINGS: Cardiac shadow is mildly enlarged. Aortic calcifications are again seen. Chronic changes in the left base are seen but improved. Changes of prior vertebral augmentation are noted. Old rib fractures are noted on the right. Undisplaced right ninth rib fracture is noted. No other focal bony abnormality is seen. IMPRESSION: Undisplaced right ninth rib fracture without complicating factors. Multiple old right rib fractures with healing. Electronically Signed   By: Inez Catalina M.D.   On: 01/10/2017 21:19        Scheduled Meds: . amiodarone  200 mg Oral BID  . amLODipine  5 mg Oral Daily  . calcium-vitamin D  1 tablet Oral BID  . carvedilol  25 mg Oral BID WC  . furosemide  60 mg Intravenous Q12H  . hydrALAZINE  100 mg  Oral TID  . isosorbide mononitrate  60 mg Oral Daily  . multivitamin with minerals  1 tablet Oral Once per day on Mon Wed Fri  . oxyCODONE-acetaminophen  1 tablet Oral Once  . potassium chloride SA  20 mEq Oral BID  . pravastatin  40 mg Oral q1800  . sodium chloride flush  3 mL Intravenous Q12H  . vitamin C  500 mg Oral Daily  . Warfarin - Pharmacist Dosing Inpatient   Does not apply q1800   Continuous Infusions: . sodium chloride       LOS: 0 days     Dominique Ressel, MD, FACP, FHM. [facility] Pager [phone]  If 7PM-7AM, please contact night-coverage [email or web address] Password [facility] 01/11/2017, 6:51 PM

## 2017-01-11 NOTE — ED Notes (Signed)
Dr. Algis Liming contacted to confirm medications

## 2017-01-11 NOTE — Telephone Encounter (Signed)
I did not order this. Maybe someone on her inpatient team ordered it recently---I see in chart that she is in the hospital again.-thx

## 2017-01-11 NOTE — Progress Notes (Signed)
[place] for warfarin  Indication: atrial fibrillation  Allergies  Allergen Reactions  . Clonidine Derivatives Palpitations and Other (See Comments)    Very sedated  . Codeine Nausea Only  . Nickel Rash  . Sulfa Antibiotics Other (See Comments)    Reaction unknown    Vital Signs: BP: 167/63 (08/27 1200) Pulse Rate: 63 (08/27 1200)  Labs:  Recent Labs  01/10/17 2203 01/11/17 0117  HGB 10.0*  --   HCT 31.3*  --   PLT 217  --   LABPROT 22.9* 22.5*  INR 1.99 1.95  CREATININE 1.93*  --     Estimated Creatinine Clearance: 16.9 mL/min (A) (by C-G formula based on SCr of 1.93 mg/dL (H)).  Assessment: 83 yo female admitted with wound check for head laceration. Patient is on warfarin PTA for Afib; pharmacy consulted to continue. INR remains slightly subtherapeutic at 1.95. No bleeding noted.     PTA dose: 1.5mg  MWF and 3mg  all other days   Goal of Therapy:  INR 2-3 Monitor platelets by anticoagulation protocol: Yes   Plan:  Repeat Warfarin 3mg  PO x 1 tonight Daily INR  Salome Arnt, PharmD, BCPS 01/11/2017 12:56 PM

## 2017-01-11 NOTE — ED Notes (Signed)
Pt repositioned in bed and lunch tray set up at bedside

## 2017-01-11 NOTE — Telephone Encounter (Signed)
Please advise. Thanks.  

## 2017-01-11 NOTE — H&P (Signed)
History and Physical    Mathew Storck ZWC:[id number] DOB: 1932-12-24 DOA: 01/10/2017  PCP: Tammi Sou, MD   Patient coming from: Home  Chief Complaint: Right-sided back pain after fall yesterday  HPI: Valerie Terrell is a 83 y.o. female with medical history significant for chronic systolic CHF, chronic kidney disease stage IV, coronary artery disease, and paroxysmal atrial fibrillation on warfarin, now presenting to the emergency department for evaluation of a bleeding head wound and right back pain after a fall. Patient was seen in the emergency department a week ago after falling and striking her forehead. She had a laceration closed with suture at that time, but began picking at it today and then had difficulty controlling the bleeding. She also complains of pain in the right back after a mechanical ground-level fall at home. Pain is severe, sharp, worse with cough or inspiration, localized, and without alleviating factors identified. EMS was called out to her house, pressure bandage was placed on her for head laceration, and she was transported to the ED for further evaluation.  ED Course: Upon arrival to the ED, patient is found to be afebrile, saturating 88 on her usual supplemental oxygen, tachypneic, and mildly hypertensive. EKG features a sinus rhythm and chest x-ray is notable for nondisplaced right ninth rib fracture without complicating features, as well as multiple old right rib fractures with healing. Chemistry panel reveals a sodium of 134, BUN 51, and creatinine of 1.93 which appears consistent with her baseline. CBC is notable for a stable normocytic anemia with hemoglobin of 10.0. There is a mild leukocytosis to 11,100. INR is slightly subtherapeutic at 1.99, troponin is within normal limits, and BNP is elevated to 1997. Patient was treated with fentanyl, Percocet, and 80 mg IV Lasix in the ED. She was placed on nonrebreather. She has remained hemodynamically stable, appears to be  comfortable while at rest on nonrebreather, and will be admitted to the telemetry unit for ongoing evaluation and management of acute on chronic hypoxic respiratory failure suspected secondary to acute on chronic systolic CHF and rib fracture with pain on inspiration.  Review of Systems:  All other systems reviewed and apart from HPI, are negative.  Past Medical History:  Diagnosis Date  . Atrophy of left kidney    with absent blood flow by renal artery dopplers (Dr. Gwenlyn Found)  . 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 Chronic combined systolic and diastolic CHF (congestive heart failure) ([place])    a. 11/2016: echo showing EF of 35-40%, RV strain noted, mild MR and mild TR.   [name] Chronic renal insufficiency, stage 4 (severe) (HCC)    Baseline GFR 25 ml/min-- renal u/s showed atrophic/hypoplastic left kidney.  No hydronephrosis.  Small right renal cyst.  . History of adenomatous polyp of colon   . History of subarachnoid hemorrhage 10/2014   after syncope and while on xarelto  . Hyperlipidemia   . Hypertension    Difficult to control, in the setting of one functioning kidney: pt was referred to nephrology by Dr. Gwenlyn Found 06/2015.  . Lumbar radiculopathy 2012  . Lumbar spondylosis    MR 07/2016---no sign of spinal nerve compression or cord compression.  Pt set up with outpt ortho while admitted to hosp 07/2016.  . Metatarsal fracture 06/10/2016   Nondisplaced, left 5th metatarsal--pt was referred to ortho  . Nonischemic cardiomyopathy ([place])   . Osteopenia 2014   T-score -2.1  . PAF (paroxysmal atrial fibrillation) (HCC)    Eliquis started after  BRAO and CVA.  She was changed to warfarin 2018.  . Pulmonary edema 11/2016  . Stroke [facility])    cardioembolic (had CVA while on no anticoag)--"scattered subacute punctate infarcts: 1 in R parietal lobe and 2 in occipital cortex" on MRI br.  CT angio head/neck: aortic arch athero.   . Thoracic compression fracture ([place]) 07/2016   T7 and T8-- T8  kyphoplasty during hosp admission 07/2016.  Neuro referred pt to pain mgmt for consideration of injection 09/2016.  I referred her to endo 08/2016 for consideration of calcitonin treatment.    Past Surgical History:  Procedure Laterality Date  . APPENDECTOMY  child  . CARDIOVASCULAR STRESS TEST  01/31/2016   Stress myoview: NORMAL/Low risk.  EF 56%.  . [place]  . COLONOSCOPY  2015   + hx of adenomatous polyps.  Need digest health spec in [place] records to see when pt due for next colonoscopy  . IR GENERIC HISTORICAL  07/24/2016   IR KYPHO THORACIC WITH BONE BIOPSY 07/24/2016 Luanne Bras, MD [facility]-INTER[facility] RAD  . KYPHOPLASTY  07/27/2016   T8  . PACEMAKER IMPLANT N/A 12/03/2016   Procedure: Pacemaker Implant;  Surgeon: Evans Lance, MD;  Location: [place] CV LAB;  Service: Cardiovascular;  Laterality: N/A;  . Renal artery dopplers  02/26/2016   Her right renal dimension was 11 cm pole to pole with mild to moderate right renal artery stenosis. A right renal aortic ratio was 3.22 suggesting less than a 50% stenosis.  . TONSILLECTOMY    . TRANSTHORACIC ECHOCARDIOGRAM  01/29/2016; 11/2016   2017: EF 55-60%, normal LV wall motion, grade I DD.  No cardiac source of emboli was seen.  2018: EF 35-40%, could not assess DD due to a-fib, pulm HTN noted.     reports that she has never smoked. She has never used smokeless tobacco. She reports that she drinks about 0.6 oz of alcohol per week . She reports that she does not use drugs.  Allergies  Allergen Reactions  . Clonidine Derivatives Palpitations and Other (See Comments)    Very sedated  . Codeine Nausea Only  . Nickel Rash  . Sulfa Antibiotics Other (See Comments)    Reaction unknown    Family History  Problem Relation Age of Onset  . Cancer Mother   . Heart disease Father   . Early death Father   . Sudden Cardiac Death Neg Hx      Prior to Admission medications   Medication Sig Start Date End Date Taking?  Authorizing Provider  acetaminophen (TYLENOL) 500 MG tablet Take 500 mg by mouth every 6 (six) hours as needed for moderate pain.    [provider]  amiodarone (PACERONE) 200 MG tablet Take 1 tablet (200 mg total) by mouth 2 (two) times daily. 01/05/17   Strader, Fransisco Hertz, PA-C  amLODipine (NORVASC) 5 MG tablet Take 1 tablet (5 mg total) by mouth daily. 01/05/17 02/04/17  Erma Heritage, PA-C  Calcium Carb-Cholecalciferol (CALCIUM/VITAMIN D PO) Take 1 tablet by mouth 2 (two) times daily.    [provider]  carvedilol (COREG) 25 MG tablet Take 1 tablet (25 mg total) by mouth 2 (two) times daily with a meal. 12/17/16   Velvet Bathe, MD  furosemide (LASIX) 80 MG tablet Take 2 tablets (160 mg total) by mouth 2 (two) times daily. 12/07/16 01/06/17  Irwin Brakeman L, MD  hydrALAZINE (APRESOLINE) 100 MG tablet Take 1 tablet (100 mg total) by mouth 3 (three)  times daily. 08/12/16   McGowen, Adrian Blackwater, MD  ibandronate (BONIVA) 150 MG tablet Take 1 tablet (150 mg total) by mouth every 30 (thirty) days. Take in the morning with a full glass of water, on an empty stomach, and do not take anything else by mouth or lie down for the next 30 min. 08/12/16   McGowen, Adrian Blackwater, MD  isosorbide mononitrate (IMDUR) 60 MG 24 hr tablet Take 1 tablet (60 mg total) by mouth daily. 12/18/16   Velvet Bathe, MD  Multiple Vitamin (MULTIVITAMIN) tablet Take 1 tablet by mouth 3 (three) times a week.     [provider]  ondansetron (ZOFRAN) 4 MG tablet Take 0.5-1 tablets (2-4 mg total) by mouth every 8 (eight) hours as needed for nausea or vomiting. 12/10/16   McGowen, Adrian Blackwater, MD  oxyCODONE-acetaminophen (PERCOCET/ROXICET) 5-325 MG tablet Take 1-2 tablets by mouth every 6 (six) hours as needed for severe pain. 12/31/16   McGowen, Adrian Blackwater, MD  Potassium Chloride ER 20 MEQ TBCR Take 20 mEq by mouth 2 (two) times daily. 12/31/16   McGowen, Adrian Blackwater, MD  pravastatin (PRAVACHOL) 40 MG tablet Take 40 mg by  mouth every evening. 07/16/15   [provider]  vitamin C (ASCORBIC ACID) 500 MG tablet Take 500 mg by mouth daily.    [provider]  Vitamin D, Ergocalciferol, (DRISDOL) 50000 units CAPS capsule 1 cap po q week x 12 weeks Patient taking differently: Take 50,000 Units by mouth every 7 (seven) days. For 12 weeks. (Thursdays) 11/09/16   McGowen, Adrian Blackwater, MD  warfarin (COUMADIN) 3 MG tablet Take 1/2 to 1 tablet by mouth daily as directed by coumadin clinic 12/21/16   Lorretta Harp, MD    Physical Exam: Vitals:   01/10/17 2145 01/10/17 2230 01/10/17 2315 01/10/17 2345  BP: (!) 201/80 (!) 169/66 (!) 164/64 (!) 163/79  Pulse: 87 80 74 81  Resp: (!) 24 (!) 22 14 (!) 23  Temp:      TempSrc:      SpO2: (!) 88% 100% 100% 96%      Constitutional: NAD, calm, appears uncomfortable Eyes: PERTLA, lids and conjunctivae normal ENMT: Mucous membranes are moist. Posterior pharynx clear of any exudate or lesions.   Neck: normal, supple, no masses, no thyromegaly Respiratory: Fine rales bilateral lower lung zones, no wheezing, no rhonchi. Normal respiratory effort.   Cardiovascular: S1 & S2 heard, regular rate and rhythm. 1+ pretibial edema bilaterally. JVP 9 cmH2O. Abdomen: No distension, no tenderness, no masses palpated. Bowel sounds normal.  Musculoskeletal: no clubbing / cyanosis. No joint deformity upper and lower extremities. Tender at right mid-back.    Skin: ecchymoses about the nasal bridge and periorbitally. Skin is otherwise warm, dry, well-perfused. Neurologic: CN 2-12 grossly intact. Sensation intact, DTR normal. Strength 5/5 in all 4 limbs.  Psychiatric: Alert and oriented x 3. Very pleasant and cooperative.      Labs on Admission: I have personally reviewed following labs and imaging studies  CBC:  Recent Labs Lab 01/10/17 2203  WBC 11.1*  NEUTROABS 8.6*  HGB 10.0*  HCT 31.3*  MCV 95.4  PLT 026   Basic Metabolic Panel:  Recent Labs Lab  01/10/17 2203  NA 134*  K 4.0  CL 92*  CO2 30  GLUCOSE 125*  BUN 51*  CREATININE 1.93*  CALCIUM 8.8*   GFR: Estimated Creatinine Clearance: 16.9 mL/min (A) (by C-G formula based on SCr of 1.93 mg/dL (H)). Liver Function Tests:  Recent Labs Lab 01/10/17 2203  AST 25  ALT 17  ALKPHOS 59  BILITOT 0.5  PROT 7.0  ALBUMIN 3.6   No results for input(s): LIPASE, AMYLASE in the last 168 hours. No results for input(s): AMMONIA in the last 168 hours. Coagulation Profile:  Recent Labs Lab 01/07/17 01/10/17 2203  INR 2.7 1.99   Cardiac Enzymes: No results for input(s): CKTOTAL, CKMB, CKMBINDEX, TROPONINI in the last 168 hours. BNP (last 3 results) No results for input(s): PROBNP in the last 8760 hours. HbA1C: No results for input(s): HGBA1C in the last 72 hours. CBG: No results for input(s): GLUCAP in the last 168 hours. Lipid Profile: No results for input(s): CHOL, HDL, LDLCALC, TRIG, CHOLHDL, LDLDIRECT in the last 72 hours. Thyroid Function Tests: No results for input(s): TSH, T4TOTAL, FREET4, T3FREE, THYROIDAB in the last 72 hours. Anemia Panel: No results for input(s): VITAMINB12, FOLATE, FERRITIN, TIBC, IRON, RETICCTPCT in the last 72 hours. Urine analysis:    Component Value Date/Time   COLORURINE YELLOW 11/30/2016 1610   APPEARANCEUR CLEAR 11/30/2016 1610   LABSPEC 1.013 11/30/2016 1610   PHURINE 5.0 11/30/2016 1610   GLUCOSEU NEGATIVE 11/30/2016 1610   HGBUR NEGATIVE 11/30/2016 1610   BILIRUBINUR NEGATIVE 11/30/2016 1610   BILIRUBINUR negative 09/24/2016 [place] 11/30/2016 1610   PROTEINUR 30 (A) 11/30/2016 1610   UROBILINOGEN 0.2 09/24/2016 1453   NITRITE NEGATIVE 11/30/2016 1610   LEUKOCYTESUR SMALL (A) 11/30/2016 1610   Sepsis Labs: @LABRCNTIP (procalcitonin:4,lacticidven:4) )No results found for this or any previous visit (from the past 240 hour(s)).   Radiological Exams on Admission: Dg Ribs Unilateral W/chest Right  Result  Date: 01/10/2017 CLINICAL DATA:  Recent fall with right chest pain, initial encounter EXAM: RIGHT RIBS AND CHEST - 3+ VIEW COMPARISON:  12/14/2016 FINDINGS: Cardiac shadow is mildly enlarged. Aortic calcifications are again seen. Chronic changes in the left base are seen but improved. Changes of prior vertebral augmentation are noted. Old rib fractures are noted on the right. Undisplaced right ninth rib fracture is noted. No other focal bony abnormality is seen. IMPRESSION: Undisplaced right ninth rib fracture without complicating factors. Multiple old right rib fractures with healing. Electronically Signed   By: Inez Catalina M.D.   On: 01/10/2017 21:19    EKG: Independently reviewed. Sinus rhythm.  Assessment/Plan  1. Acute on chronic hypoxic respiratory failure  - Pt dependent on 3-4 Lpm supplemental O2 around-the-clock at baseline - Saturating in 80's on her usual FiO2 while at rest in bed  - No focal infiltrate or conspicuous edema on CXR  - Suspect this is multifactorial with contributions from acute on chronic CHF and rib fracture with pain on inspiration  - Plan to diurese and start incentive spirometry and pain-control as discussed below   2. Fall with rib fracture - Pt reports a fall the day prior to admission with pain in the right back, worse with inspiration  - Radiographs reveal new fracture of the right 9th rib without apparent complication  - Plan to continue analgesia, incentive spirometry, supplemental O2   3. Acute on chronic systolic CHF  - Pt appears to be hypervolemic on admission - TTE (12/02/16) with EF 35-40%, moderate concentric hypertrophy, no WMA's, mild MR, mild TR, mild LAE, and moderate pulmonary HTN  - Managed at home with Lasix 80 mg IV BID, Coreg, and hydralazine  - Treated in ED with Lasix 80 mg IV  - Plan to SLIV, fluid-restrict diet, follow daily wts and I/O's, continue  diuresis with Lasix 60 mg IV q12h, continue Coreg as tolerated, and follow daily chem  panel    4. Paroxysmal atrial fibrillation  - In a sinus rhythm on admission - CHADS-VASc is 23 (age x2, gender, CHF, HTN)  - Continue warfarin with pharmacy assistance, continue amiodarone   5. CKD stage IV - SCr is 1.93 on admission, consistent with her apparent baseline  - Plan to follow daily chem panel during diuresis, renally-dose medications as needed    6. Hypertension  - BP elevated in ED in setting of pain and hypervolemia  - Diurese and manage pain as above - Continue Coreg, Norvasc, hydralazine  - Use hydralazine IVP's prn    7. Anemia  - Hgb appears stable at 10.0 with no bleeding evident    DVT prophylaxis: warfarin Code Status: Full  Family Communication: Discussed with patient Disposition Plan: Admit to telemetry Consults called: None Admission status: Inpatient    Vianne Bulls, MD [facility] Pager [phone]  If 7PM-7AM, please contact night-coverage [email or web address] Password [email or web address]  01/11/2017, 12:08 AM

## 2017-01-11 NOTE — ED Notes (Signed)
Patient moved by staff onto hospital bed for comfort. Pt also requesting something for pain at this time.

## 2017-01-12 ENCOUNTER — Encounter (HOSPITAL_COMMUNITY): Payer: Medicare Other

## 2017-01-12 LAB — BASIC METABOLIC PANEL
ANION GAP: 12 (ref 5–15)
BUN: 55 mg/dL — ABNORMAL HIGH (ref 6–20)
CO2: 33 mmol/L — ABNORMAL HIGH (ref 22–32)
Calcium: 9.3 mg/dL (ref 8.9–10.3)
Chloride: 92 mmol/L — ABNORMAL LOW (ref 101–111)
Creatinine, Ser: 2.29 mg/dL — ABNORMAL HIGH (ref 0.44–1.00)
GFR calc Af Amer: 22 mL/min — ABNORMAL LOW (ref >60)
GFR, EST NON AFRICAN AMERICAN: 19 mL/min — AB (ref >60)
Glucose, Bld: 105 mg/dL — ABNORMAL HIGH (ref 65–99)
POTASSIUM: 4 mmol/L (ref 3.5–5.1)
SODIUM: 137 mmol/L (ref 135–145)

## 2017-01-12 LAB — PROTIME-INR
INR: 2.44
PROTHROMBIN TIME: 27 s — AB (ref 11.4–15.2)

## 2017-01-12 MED ORDER — PRAVASTATIN SODIUM 40 MG PO TABS
80.0000 mg | ORAL_TABLET | Freq: Every day | ORAL | Status: DC
  Administered 2017-01-13 – 2017-02-02 (×19): 80 mg via ORAL
  Filled 2017-01-12 (×22): qty 2

## 2017-01-12 MED ORDER — DICLOFENAC EPOLAMINE 1.3 % TD PTCH
1.0000 | MEDICATED_PATCH | Freq: Two times a day (BID) | TRANSDERMAL | Status: DC
  Administered 2017-01-12 – 2017-01-20 (×18): 1 via TRANSDERMAL
  Filled 2017-01-12 (×19): qty 1

## 2017-01-12 MED ORDER — WARFARIN SODIUM 3 MG PO TABS
1.5000 mg | ORAL_TABLET | Freq: Once | ORAL | Status: AC
  Administered 2017-01-12: 1.5 mg via ORAL
  Filled 2017-01-12 (×2): qty 0.5

## 2017-01-12 MED ORDER — FUROSEMIDE 10 MG/ML IJ SOLN
40.0000 mg | Freq: Two times a day (BID) | INTRAMUSCULAR | Status: DC
  Administered 2017-01-12 – 2017-01-13 (×2): 40 mg via INTRAVENOUS
  Filled 2017-01-12 (×2): qty 4

## 2017-01-12 NOTE — Progress Notes (Signed)
[place] for warfarin  Indication: atrial fibrillation  Allergies  Allergen Reactions  . Clonidine Derivatives Palpitations and Other (See Comments)    Very sedated  . Codeine Nausea Only  . Nickel Rash  . Sulfa Antibiotics Other (See Comments)    Reaction unknown   Assessment: 83 yo female admitted with wound check for head laceration. On Coumadin 3mg  daily exc for 1.5mg  on MWF PTA for hx afib. INR therapeutic at 2.44 today. Hgb 10, plts wnl. No bleeding noted.  Goal of Therapy:  INR 2-3 Monitor platelets by anticoagulation protocol: Yes   Plan:  Give Coumadin 1.5mg  PO x 1 Monitor daily INR, CBC, s/s of bleed  Elenor Quinones, PharmD, [facility] Clinical Pharmacist Pager [phone] 01/12/2017 8:13 AM

## 2017-01-12 NOTE — Progress Notes (Signed)
PROGRESS NOTE   Valerie Terrell  ZOX:[id number]    DOB: 31-Jul-1932    DOA: 01/10/2017  PCP: Tammi Sou, MD   I have briefly reviewed patients previous medical records in [facility].  Brief Narrative:  38 cf chronic combined diastolic and systolic CHF--Last EF 12/1189 ~ 50-55%, stage IV chronic kidney disease with atretic L kidney [no blood flow by RAS-Dr. Gwenlyn Found 10.2017]--baseline creat 2-3  CAD  PAF/sick sinus s/p PPM on warfarin CHad2Vasc2 score=6,  home oxygen 3-4 L/m,  HLD,  HTN,  Occular CVA 9.14.17,   presented to the ED on 01/11/17 for evaluation of bleeding head wound and right back pain after a fall.  Has been admitted ~ 3-4 times in the past 2 months with AECHF and other issues She had been seen in the ED a week ago after falling and striking her head at which time the laceration was closed with suture but patient apparently started picking at it and then had difficulty controlling bleeding.   She also sustained a mechanical ground-level fall at home and reported right sided back pain worse with chest wall movements or deep inspiration.   In the ED, hypoxic at 88% on her supplemental oxygen, tachypneic, mildly hypertensive, chest x-ray showed nondisplaced right ninth rib fracture without complicating features as well as multiple old right rib fractures with healing. S  he was briefly placed on nonrebreather mask. Admitted for acute on chronic hypoxic respiratory failure secondary to acute on chronic systolic and diastolic CHF and chest wall splinting related to pain from rib fracture.   Assessment & Plan:   Principal Problem:   Acute on chronic respiratory failure with hypoxia (HCC) Active Problems:   PAF (paroxysmal atrial fibrillation) (HCC)   Essential hypertension   CKD (chronic kidney disease), stage IV (HCC)   Acute combined systolic and diastolic heart failure (HCC) - possibly related to A. fib   Fracture of one rib, right side, initial encounter for  closed fracture   Acute hypoxic resp failure Acute decompnesated hf-both sys/diastolic--Last EF 47-82%--NFAO like 50-55% per DR. Gwenlyn Found note in the past RIb fractures causing splinting  Last d/c 8.2 on 160 bid lasix--currently on lasix 60 iv bid--given rise in creat will monitor this dose closely and diurese less aggressively with 40 IV bid.  Has been seen in the past by [facility]--might need to involve them in discussions re: waxing waning AKI as OP-follows with Dr. Orbie Hurst appreciate high JVD today but has some +1 edema so continue IV lasix for now  Cont Pain control morphine IV 1-3 q 3 prn, can use Percocet as 1st choice for pain   Lidocaine patch not working-change to Flector-reassess frequently    stressed importance of IS--up with PT today Neoma Laming has HH at home]  desat screen to home oxygen levels as tol  P afib, CHAD=5 Prior CAd h/o  Cont Amio 200 bid, coreg 25 bid, Imdur 60 qd, Amlodipine 5 qd  Is in NSR at bedside  INR therapeutic    Htn See above discussion May need to lower some meds [amlodipine/imdur] to facilitate diuresis Uncontrolled 2/2 to pain  Stg IV CKD Anemia likely renal disease  Ferreheme x 1 8/28  Monitor kidney fucntion  Prior Occular CVa and SAH 2/2 to fall  Has HH  Needs re-eval by PT  High dose PRavachol 80 [change from 40 PTA}  On coumadin--Unclear if good idea to start antiplt with falls + bleed etc.    DVT prophylaxis: Coumadin per pharmacy  Code Status: Full Family Communication: talked with daughter who she resides wit at bedisde Disposition: DC home when medically improved--[place] ~ 48 hours   Consultants:  None   Procedures:  None  Antimicrobials:  None    Subjective:  Some pain--mor ewith moving 8/10 No n/v Eating poorly Base weight ~ 106  Objective:  Vitals:   01/11/17 2020 01/12/17 0036 01/12/17 0455 01/12/17 1011  BP: (!) 166/69 (!) 145/64 (!) 159/59 (!) 151/68  Pulse: 76 67 71 70  Resp: 18 18 18    Temp: 99 F  (37.2 C) 98.4 F (36.9 C) 97.9 F (36.6 C)   TempSrc: Oral Oral Oral   SpO2: 92% 93% 92%   Weight:   51.5 kg (113 lb 9.6 oz)   Height:        Examination:  eomi frail-hematoma R forehead s1 s2 rrr, nsr, 1/6 hsm Mild jvd, mild le edema abd soft but slight distension No rebound no gharud rom intact,, neuro CN x 12 grsolly intact No rash No pallor, no ict, no lan   Data Reviewed: I have personally reviewed following labs and imaging studies  CBC:  Recent Labs Lab 01/10/17 2203  WBC 11.1*  NEUTROABS 8.6*  HGB 10.0*  HCT 31.3*  MCV 95.4  PLT 166   Basic Metabolic Panel:  Recent Labs Lab 01/10/17 2203 01/12/17 0613  NA 134* 137  K 4.0 4.0  CL 92* 92*  CO2 30 33*  GLUCOSE 125* 105*  BUN 51* 55*  CREATININE 1.93* 2.29*  CALCIUM 8.8* 9.3   Liver Function Tests:  Recent Labs Lab 01/10/17 2203  AST 25  ALT 17  ALKPHOS 59  BILITOT 0.5  PROT 7.0  ALBUMIN 3.6   Coagulation Profile:  Recent Labs Lab 01/07/17 01/10/17 2203 01/11/17 0117 01/12/17 0613  INR 2.7 1.99 1.95 2.44   Cardiac Enzymes: No results for input(s): CKTOTAL, CKMB, CKMBINDEX, TROPONINI in the last 168 hours. HbA1C: No results for input(s): HGBA1C in the last 72 hours. CBG: No results for input(s): GLUCAP in the last 168 hours.  No results found for this or any previous visit (from the past 240 hour(s)).       Radiology Studies: Dg Ribs Unilateral W/chest Right  Result Date: 01/10/2017 CLINICAL DATA:  Recent fall with right chest pain, initial encounter EXAM: RIGHT RIBS AND CHEST - 3+ VIEW COMPARISON:  12/14/2016 FINDINGS: Cardiac shadow is mildly enlarged. Aortic calcifications are again seen. Chronic changes in the left base are seen but improved. Changes of prior vertebral augmentation are noted. Old rib fractures are noted on the right. Undisplaced right ninth rib fracture is noted. No other focal bony abnormality is seen. IMPRESSION: Undisplaced right ninth rib fracture  without complicating factors. Multiple old right rib fractures with healing. Electronically Signed   By: Inez Catalina M.D.   On: 01/10/2017 21:19     Scheduled Meds: . amiodarone  200 mg Oral BID  . amLODipine  5 mg Oral Daily  . calcium-vitamin D  1 tablet Oral BID  . carvedilol  25 mg Oral BID WC  . diclofenac  1 patch Transdermal BID  . furosemide  40 mg Intravenous Q12H  . hydrALAZINE  100 mg Oral TID  . isosorbide mononitrate  60 mg Oral Daily  . lidocaine  1 patch Transdermal Q24H  . multivitamin with minerals  1 tablet Oral Once per day on Mon Wed Fri  . potassium chloride SA  20 mEq Oral BID  . pravastatin  40  mg Oral q1800  . sodium chloride flush  3 mL Intravenous Q12H  . vitamin C  500 mg Oral Daily  . warfarin  1.5 mg Oral ONCE-1800  . Warfarin - Pharmacist Dosing Inpatient   Does not apply q1800   Continuous Infusions: . sodium chloride    . ferumoxytol       LOS: 1 day  Verneita Griffes, MD [facility] [phone]

## 2017-01-12 NOTE — Consult Note (Signed)
   [facility] CM Inpatient Consult   01/12/2017  Tayvia Faughnan [birth year] [id number]  Assessed for re-admissions in the past 6 months.  Patient is currently active with [facility] for chronic disease management services in the [employer] [place].  Patient has been engaged by a [employer]. Returned to meet with the patient and she has a bruised over her right eye from a recent fall and with HF exacerbation with hypoxia.  Met with the patient at the bedside, grimacing.  She states, "I am pretty sore from the fall."  Active consent on file.  Patient consents to ongoing follow up.  Will follow for disposition and needs.   Primary Care Provider is Dr. Shawnie Dapper with [facility].  Our community based plan of care has focused on disease management and community resource support.  Patient will receive a post discharge transition of care call and will be evaluated for monthly home visits for assessments and disease process education.  Patient is also active with [facility], as well.    Made Inpatient Case Manager aware that [facility] following. Of note, [facility] Care Management services does not replace or interfere with any services that are needed or arranged by inpatient case management or social work.  For additional questions or referrals please contact:  Natividad Brood, RN BSN [facility] Liaison  [phone] business mobile phone Toll free office [phone]

## 2017-01-12 NOTE — Progress Notes (Signed)
PT Cancellation Note  Patient Details Name: Sussan Meter MRN: [id number] DOB: 04-30-1933   Cancelled Treatment:    Reason Eval/Treat Not Completed: (P) Fatigue/lethargy limiting ability to participate Pt reports already working with OT and is too tired and is in too much pain to participate in PT today. PT will attempt evaluation tomorrow.  Devere Brem B. Migdalia Dk PT, DPT Acute Rehabilitation  [phone] Pager [phone]   [place] 01/12/2017, 4:50 PM

## 2017-01-12 NOTE — Evaluation (Addendum)
Occupational Therapy Evaluation Patient Details Name: Valerie Terrell MRN: [id number] DOB: September 15, 1932 Today's Date: 01/12/2017    History of Present Illness Pt is an 83 y.o. female who presented to the ED for evaluation of bleeding head wound and R back pain after a fall. In the ED, hypoxic at 88% on her supplemental oxygen, tachypneic, mildly hypertensive, chest x-ray showed nondisplaced right ninth rib fracture without complicating features as well as multiple old right rib fractures with healing. She was briefly placed on nonrebreather mask. Admitted for acute on chronic hypoxic respiratory failure secondary to acute on chronic systolic and diastolic CHF and chest wall splinting related to pain from rib fracture. PMH significant for: chronic combined diastolic and systolic CHF, NICM, stage IV chronic kidney disease, CAD, PAF on warfarin, home oxygen 3-4 L/m, HLD, HTN, and CVA.   Clinical Impression   PTA, pt reports utilizing RW and cane for functional mobility and requiring intermittent assistance for ADL participation. She currently requires mod assist for LB ADL and min assist for toilet transfers. Pt presents with significant lower back and rib pain, confusion at times, generalized weakness, and significantly decreased activity tolerance for ADL participation. At current functional level, pt would benefit from short-term SNF level rehabilitation. Discussed with pt and she is not agreeable to this and thus will need maximum home health services. OT will continue to follow while admitted.   Orthostatic blood pressure measurements as follows: Supine: 155/67 Sitting: 154/66 Standing: 143/65 (pt unable to tolerate standing for 3 minutes for final measurement)    Follow Up Recommendations  Home health OT (Pt adamantly against SNF; would benefit from [facility] program)    Equipment Recommendations  3 in 1 bedside commode;Tub/shower seat    Recommendations for Other Services        Precautions / Restrictions Precautions Precautions: Fall Restrictions Weight Bearing Restrictions: No      Mobility Bed Mobility Overal bed mobility: Needs Assistance Bed Mobility: Supine to Sit;Sit to Supine     Supine to sit: Mod assist;HOB elevated Sit to supine: Max assist   General bed mobility comments: Max assist to return to bed for safety as pt attempting to unsafely return to bed.   Transfers Overall transfer level: Needs assistance Equipment used: Rolling walker (2 wheeled) Transfers: Sit to/from Stand Sit to Stand: Min assist         General transfer comment: Min assist to power-up to full standing position.     Balance Overall balance assessment: Needs assistance Sitting-balance support: Bilateral upper extremity supported;No upper extremity supported Sitting balance-Leahy Scale: Fair     Standing balance support: Bilateral upper extremity supported Standing balance-Leahy Scale: Poor Standing balance comment: Reliant on external assistance or UE support.                            ADL either performed or assessed with clinical judgement   ADL Overall ADL's : Needs assistance/impaired Eating/Feeding: Set up;Bed level   Grooming: Min guard;Sitting   Upper Body Bathing: Minimal assistance;Sitting   Lower Body Bathing: Sit to/from stand;Moderate assistance   Upper Body Dressing : Moderate assistance;Sitting   Lower Body Dressing: Sit to/from stand;Moderate assistance   Toilet Transfer: Minimal assistance;Stand-pivot;BSC;RW Toilet Transfer Details (indicate cue type and reason): Decreased activity tolerance and pt fatigues quickly.  Toileting- Clothing Manipulation and Hygiene: Moderate assistance;Sit to/from stand       Functional mobility during ADLs: Minimal assistance;Rolling walker (stand-pivot only) General ADL Comments:  Pt limited by pain and decreased activity tolerance.      Vision Baseline Vision/History: Wears  glasses Wears Glasses: Reading only Patient Visual Report: No change from baseline Vision Assessment?: No apparent visual deficits     Perception     Praxis      Pertinent Vitals/Pain Pain Assessment: 0-10 Pain Score: 8  Pain Location: R ribs Pain Descriptors / Indicators: Aching;Grimacing;Sore Pain Intervention(s): Monitored during session;Repositioned     Hand Dominance Right   Extremity/Trunk Assessment Upper Extremity Assessment Upper Extremity Assessment: Generalized weakness (Rib pain with active movement. )   Lower Extremity Assessment Lower Extremity Assessment: Generalized weakness;RLE deficits/detail RLE Deficits / Details: large bruise behind R knee       Communication     Cognition Arousal/Alertness: Awake/alert Behavior During Therapy: WFL for tasks assessed/performed Overall Cognitive Status: No family/caregiver present to determine baseline cognitive functioning Area of Impairment: Orientation;Attention;Safety/judgement;Awareness                 Orientation Level: Disoriented to;Place Current Attention Level: Sustained     Safety/Judgement: Decreased awareness of deficits Awareness: Emergent   General Comments: Pt reporting that she is at home initially but when questioned, able to report she is at the hospital. Decreased awareness and understanding of impact of deficits on functional skills and safety.    General Comments       Exercises     Shoulder Instructions      Home Living Family/patient expects to be discharged to:: Private residence Living Arrangements: Children Available Help at Discharge: Family ("most of the time") Type of Home: House Home Access: Stairs to enter [employer] of Steps: 3 Entrance Stairs-Rails: None Home Layout: One level     Bathroom Shower/Tub: Teacher, early years/pre: Handicapped height     Home Equipment: Environmental consultant - 2 wheels;Cane - single point;Bedside commode   Additional  Comments: Pt active with HHPT      Prior Functioning/Environment Level of Independence: Needs assistance  Gait / Transfers Assistance Needed: Uses both cane and RW at times.  ADL's / Homemaking Assistance Needed: Intermittent assistance.            OT Problem List: Decreased strength;Decreased range of motion;Impaired balance (sitting and/or standing);Decreased activity tolerance;Pain;Decreased cognition;Decreased safety awareness      OT Treatment/Interventions: Self-care/ADL training;Therapeutic exercise;Patient/family education;Therapeutic activities;Visual/perceptual remediation/compensation;Energy conservation    OT Goals(Current goals can be found in the care plan section) Acute Rehab OT Goals Patient Stated Goal: to go home OT Goal Formulation: With patient Time For Goal Achievement: 01/26/17 Potential to Achieve Goals: Good  OT Frequency: Min 2X/week   Barriers to D/C:            Co-evaluation              AM-PAC PT "6 Clicks" Daily Activity     Outcome Measure Help from another person eating meals?: A Little Help from another person taking care of personal grooming?: A Little Help from another person toileting, which includes using toliet, bedpan, or urinal?: A Lot Help from another person bathing (including washing, rinsing, drying)?: A Lot Help from another person to put on and taking off regular upper body clothing?: A Little Help from another person to put on and taking off regular lower body clothing?: A Lot 6 Click Score: 15   End of Session Equipment Utilized During Treatment: Gait belt;Rolling walker Nurse Communication: Mobility status  Activity Tolerance: Patient tolerated treatment well Patient left: in bed;with call bell/phone within  reach  OT Visit Diagnosis: Unsteadiness on feet (R26.81);Muscle weakness (generalized) (M62.81);Pain Pain - Right/Left: Right Pain - part of body:  (back, ribs)                Time: 9628-3662 OT Time  Calculation (min): 33 min Charges:  OT General Charges $OT Visit: 1 Visit OT Evaluation $OT Eval Moderate Complexity: 1 Mod OT Treatments $Self Care/Home Management : 8-22 mins G-Codes:     Norman Herrlich, MS OTR/L  Pager: [place] A Bettejane Leavens 01/12/2017, 4:40 PM

## 2017-01-12 NOTE — Care Management Note (Signed)
Case Management Note  Patient Details  Name: Valerie Terrell MRN: [id number] Date of Birth: [birth year]  Subjective/Objective:   CHF                Action/Plan: Patient lives with her daughter; PCP: Tammi Sou, MD; has private insurance with [facility] with prescription drug coverage; is active with [facility] and PT as prior to admission; CM following for DCP.  Expected Discharge Date:  Possibly 01/17/2017               Expected Discharge Plan:  [place]  In-House Referral:   [facility]  Discharge planning Services  CM Consult    Status of Service:  In process, will continue to follow  Sherrilyn Rist [phone] 01/12/2017, 3:29 PM

## 2017-01-13 ENCOUNTER — Inpatient Hospital Stay (HOSPITAL_COMMUNITY): Payer: Medicare Other

## 2017-01-13 LAB — COMPREHENSIVE METABOLIC PANEL
ALK PHOS: 51 U/L (ref 38–126)
ALT: 14 U/L (ref 14–54)
AST: 20 U/L (ref 15–41)
Albumin: 3.2 g/dL — ABNORMAL LOW (ref 3.5–5.0)
Anion gap: 10 (ref 5–15)
BUN: 53 mg/dL — AB (ref 6–20)
CALCIUM: 9.2 mg/dL (ref 8.9–10.3)
CHLORIDE: 93 mmol/L — AB (ref 101–111)
CO2: 32 mmol/L (ref 22–32)
CREATININE: 2.27 mg/dL — AB (ref 0.44–1.00)
GFR calc non Af Amer: 19 mL/min — ABNORMAL LOW (ref >60)
GFR, EST AFRICAN AMERICAN: 22 mL/min — AB (ref >60)
Glucose, Bld: 107 mg/dL — ABNORMAL HIGH (ref 65–99)
Potassium: 4.2 mmol/L (ref 3.5–5.1)
SODIUM: 135 mmol/L (ref 135–145)
Total Bilirubin: 0.7 mg/dL (ref 0.3–1.2)
Total Protein: 6.4 g/dL — ABNORMAL LOW (ref 6.5–8.1)

## 2017-01-13 LAB — CBC WITH DIFFERENTIAL/PLATELET
BASOS PCT: 0 %
Basophils Absolute: 0 10*3/uL (ref 0.0–0.1)
EOS ABS: 0.1 10*3/uL (ref 0.0–0.7)
EOS PCT: 1 %
HCT: 27.4 % — ABNORMAL LOW (ref 36.0–46.0)
HEMOGLOBIN: 8.3 g/dL — AB (ref 12.0–15.0)
LYMPHS ABS: 0.9 10*3/uL (ref 0.7–4.0)
Lymphocytes Relative: 8 %
MCH: 29 pg (ref 26.0–34.0)
MCHC: 30.3 g/dL (ref 30.0–36.0)
MCV: 95.8 fL (ref 78.0–100.0)
MONO ABS: 1.2 10*3/uL — AB (ref 0.1–1.0)
MONOS PCT: 11 %
Neutro Abs: 8.8 10*3/uL — ABNORMAL HIGH (ref 1.7–7.7)
Neutrophils Relative %: 80 %
PLATELETS: 209 10*3/uL (ref 150–400)
RBC: 2.86 MIL/uL — ABNORMAL LOW (ref 3.87–5.11)
RDW: 15.4 % (ref 11.5–15.5)
WBC: 11.2 10*3/uL — ABNORMAL HIGH (ref 4.0–10.5)

## 2017-01-13 LAB — PROTIME-INR
INR: 3.45
Prothrombin Time: 34.5 seconds — ABNORMAL HIGH (ref 11.4–15.2)

## 2017-01-13 MED ORDER — HYDRALAZINE HCL 20 MG/ML IJ SOLN
10.0000 mg | INTRAMUSCULAR | Status: DC | PRN
  Administered 2017-01-15: 10 mg via INTRAVENOUS
  Filled 2017-01-13 (×2): qty 1

## 2017-01-13 MED ORDER — DEXTROSE 5 % IV SOLN
1.0000 g | Freq: Three times a day (TID) | INTRAVENOUS | Status: DC
  Filled 2017-01-13: qty 1

## 2017-01-13 MED ORDER — CEFEPIME HCL 1 G IJ SOLR
1.0000 g | INTRAMUSCULAR | Status: DC
  Administered 2017-01-13 – 2017-01-16 (×4): 1 g via INTRAVENOUS
  Filled 2017-01-13 (×4): qty 1

## 2017-01-13 MED ORDER — VANCOMYCIN HCL IN DEXTROSE 1-5 GM/200ML-% IV SOLN
1000.0000 mg | Freq: Once | INTRAVENOUS | Status: AC
  Administered 2017-01-13: 1000 mg via INTRAVENOUS
  Filled 2017-01-13 (×2): qty 200

## 2017-01-13 MED ORDER — FUROSEMIDE 40 MG PO TABS
40.0000 mg | ORAL_TABLET | Freq: Two times a day (BID) | ORAL | Status: DC
  Administered 2017-01-13 – 2017-01-14 (×3): 40 mg via ORAL
  Filled 2017-01-13 (×3): qty 1

## 2017-01-13 MED ORDER — ALBUTEROL SULFATE (2.5 MG/3ML) 0.083% IN NEBU
2.5000 mg | INHALATION_SOLUTION | Freq: Four times a day (QID) | RESPIRATORY_TRACT | Status: DC | PRN
  Administered 2017-01-13 – 2017-01-30 (×11): 2.5 mg via RESPIRATORY_TRACT
  Filled 2017-01-13 (×11): qty 3

## 2017-01-13 MED ORDER — VANCOMYCIN HCL 500 MG IV SOLR
500.0000 mg | INTRAVENOUS | Status: DC
  Administered 2017-01-15: 500 mg via INTRAVENOUS
  Filled 2017-01-13: qty 500

## 2017-01-13 MED ORDER — WARFARIN 0.5 MG HALF TABLET
0.5000 mg | ORAL_TABLET | Freq: Once | ORAL | Status: DC
Start: 2017-01-13 — End: 2017-01-13

## 2017-01-13 NOTE — Progress Notes (Signed)
PROGRESS NOTE   Valerie Terrell  OBS:[id number]    DOB: March 11, 1933    DOA: 01/10/2017  PCP: Tammi Sou, MD   I have briefly reviewed patients previous medical records in [facility].  Brief Narrative:   60 cf chronic combined diastolic and systolic CHF--Last EF 08/7652 ~ 50-55%, stage IV chronic kidney disease with atretic L kidney [no blood flow by RAS-Dr. Gwenlyn Found 10.2017]--baseline creat 2-3 CAD PAF/sick sinus s/p PPM on warfarin CHad2Vasc2 score=6,  home oxygen 3-4 L/m, HLD,  HTN,  Occular CVA 9.14.17,   presented to the ED on 01/11/17 for evaluation of bleeding head wound and right back pain after a fall.  Has been admitted ~ 3-4 times in the past 2 months with AECHF and other issues She had been seen in the ED a week ago after falling and striking her head at which time the laceration was closed with suture but patient apparently started picking at it and then had difficulty controlling bleeding.   She also sustained a mechanical ground-level fall at home and reported right sided back pain worse with chest wall movements or deep inspiration.   In the ED, hypoxic at 88% on her supplemental oxygen, tachypneic, mildly hypertensive, chest x-ray showed nondisplaced right ninth rib fracture without complicating features as well as multiple old right rib fractures with healing.   he was briefly placed on nonrebreather mask. Admitted for acute on chronic hypoxic respiratory failure secondary to acute on chronic systolic and diastolic CHF and chest wall splinting related to pain from rib fracture.   Assessment & Plan:   Principal Problem:   Acute on chronic respiratory failure with hypoxia (HCC) Active Problems:   PAF (paroxysmal atrial fibrillation) (HCC)   Essential hypertension   CKD (chronic kidney disease), stage IV (HCC)   Acute combined systolic and diastolic heart failure (HCC) - possibly related to A. fib   Fracture of one rib, right side, initial encounter for  closed fracture   Acute hypoxic resp failure Acute decompnesated hf-both sys/diastolic--Last EF 65-03%--TWSF like 50-55% per DR. Gwenlyn Found note in the past RIb fractures causing splinting--Possible Atelectasis of PNa on CXR 8.29  Last d/c 8.2 on 160 bid lasix--on admit lasix 60 iv bid--8/28--40 IV bid.  Has been seen in the past by Renal-renal function  has stabilized to 2.27 creat   use Percocet as 1st choice for pain, d/c IV morphine 8/20 2/2 sleepiness   Flector -helping with rib pain   stressed importance of IS--up with PT when able  desat screen to home oxygen levels as tol  I/o - 1.5 liters, Now weight 102 [home weight 106]--scale back diuretics in am 8/30   ? Aspiration PNa vs HCAP 8.29 cxr shows ? Increased R Retrocardiac opacity--will start Cefepime /vanc for Rx--will ask speech to screen her as well given location of PNA being R sided and frailty   P afib, CHAD=5 Prior CAd h/o  Cont Amio 200 bid, coreg 25 bid, Imdur 60 qd, Amlodipine 5 qd  Is in NSR at bedside  INR therapeutic-3.4--PharmD to dose  Htn See above discussion Uncontrolled 2/2 to pain  Stg IV CKD Anemia likely renal disease  Ferreheme x 1 8/28  Monitor kidney fucntion--stable as above with lasix IV  Prior Occular CVa and SAH 2/2 to fall  Has HH  Needs re-eval by PT  High dose PRavachol 80 [change from 40 PTA}  On coumadin--Unclear if good idea to start antiplt with falls + bleed etc.  DVT prophylaxis: Coumadin per pharmacy Code Status: Full Family Communication: talked with daughter who she resides wit at bedisde Disposition: DC home when medically improved--[place] ~ 48 hours   Consultants:  None   Procedures:  None  Antimicrobials:  None    Subjective:  Sleepy this am--just given perccoet Aropusabl;e Doesn't feel very well sOB overnight and needed nebs Nursing concerns about giving IV morphine  Objective:  Vitals:   01/13/17 0630 01/13/17 0641 01/13/17 0653 01/13/17 1101  BP:   (!) 171/49 (!) 143/66 (!) 182/62  Pulse:  71 70 70  Resp: 20 20 20    Temp:    98.5 F (36.9 C)  TempSrc:    Oral  SpO2: 96% 96% 96%   Weight:      Height:        Examination:   eomi frail-hematoma R forehead s1 s2 rrr, nsr, 1/6 hsm Mild jvd, mild le edema Increased resonanace and decreased lung sounds on R post lung fields rom intact,, neuro CN x 12 grsolly intact No rash No pallor, no ict, no lan   Data Reviewed: I have personally reviewed following labs and imaging studies  CBC:  Recent Labs Lab 01/10/17 2203 01/13/17 0524  WBC 11.1* 11.2*  NEUTROABS 8.6* 8.8*  HGB 10.0* 8.3*  HCT 31.3* 27.4*  MCV 95.4 95.8  PLT 217 350   Basic Metabolic Panel:  Recent Labs Lab 01/10/17 2203 01/12/17 0613 01/13/17 0524  NA 134* 137 135  K 4.0 4.0 4.2  CL 92* 92* 93*  CO2 30 33* 32  GLUCOSE 125* 105* 107*  BUN 51* 55* 53*  CREATININE 1.93* 2.29* 2.27*  CALCIUM 8.8* 9.3 9.2   Liver Function Tests:  Recent Labs Lab 01/10/17 2203 01/13/17 0524  AST 25 20  ALT 17 14  ALKPHOS 59 51  BILITOT 0.5 0.7  PROT 7.0 6.4*  ALBUMIN 3.6 3.2*   Coagulation Profile:  Recent Labs Lab 01/07/17 01/10/17 2203 01/11/17 0117 01/12/17 0613 01/13/17 0524  INR 2.7 1.99 1.95 2.44 3.45   Cardiac Enzymes: No results for input(s): CKTOTAL, CKMB, CKMBINDEX, TROPONINI in the last 168 hours. HbA1C: No results for input(s): HGBA1C in the last 72 hours. CBG: No results for input(s): GLUCAP in the last 168 hours.  No results found for this or any previous visit (from the past 240 hour(s)).       Radiology Studies: Dg Chest 2 View  Result Date: 01/13/2017 CLINICAL DATA:  Recent fall with right lower rib fractures, respiratory distress with shortness of breath. Fatigue. Low oxygen saturation. EXAM: CHEST  2 VIEW COMPARISON:  Chest x-ray of January 10, 2017 FINDINGS: The lungs are adequately inflated. The interstitial markings remain increased. There is persistent left  retrocardiac density. The pulmonary vascularity is engorged. The cardiac silhouette is enlarged. There is calcification in the wall of the thoracic aorta. The ICD is in stable position. The patient has undergone previous kyphoplasty of T9. IMPRESSION: CHF with interstitial edema. Persistent density in the retrocardiac region may reflect atelectasis or pneumonia. Electronically Signed   By: David  Martinique M.D.   On: 01/13/2017 10:03     Scheduled Meds: . amiodarone  200 mg Oral BID  . amLODipine  5 mg Oral Daily  . calcium-vitamin D  1 tablet Oral BID  . carvedilol  25 mg Oral BID WC  . diclofenac  1 patch Transdermal BID  . furosemide  40 mg Intravenous Q12H  . hydrALAZINE  100 mg Oral TID  . isosorbide mononitrate  60 mg Oral Daily  . multivitamin with minerals  1 tablet Oral Once per day on Mon Wed Fri  . potassium chloride SA  20 mEq Oral BID  . pravastatin  80 mg Oral q1800  . sodium chloride flush  3 mL Intravenous Q12H  . vitamin C  500 mg Oral Daily  . Warfarin - Pharmacist Dosing Inpatient   Does not apply q1800   Continuous Infusions: . sodium chloride    . ferumoxytol Stopped (01/12/17 1015)     LOS: 2 days  Verneita Griffes, MD [facility] [phone]

## 2017-01-13 NOTE — Progress Notes (Signed)
Patient breathing improved with breathing treatment. No audible wheezing on assessment. Lung sounds  Clear/ diminished. Sat 95% on 2l nasal canula. MD paged and notified of patient's BP. No new orders at this time. Will continue to monitor patient.

## 2017-01-13 NOTE — Progress Notes (Signed)
Patient complaining of shortness of breath and audible wheezing. SATS 90% on  2L nasal canula. MD paged for PRN breathing treatment.

## 2017-01-13 NOTE — Progress Notes (Signed)
Received consult for the Home First Program;  Patient is active with [facility] services as prior to admission; patient does not qualify for the program per [place] with [facility] with the Home First Program. CM will continue to follow the patient for DCP; B Pennie Rushing [phone]

## 2017-01-13 NOTE — Progress Notes (Signed)
Pharmacy Antibiotic Note  Valerie Terrell is a 83 y.o. female admitted on 01/10/2017 with possible PNA.  Pharmacy has been consulted for vancomycin dosing.  Plan: Change cefepime to 1g IV Q24h Give vancomycin 1g IV x 1, then start vancomycin 500mg  IV Q48h Monitor clinical picture, renal function, VT prn F/U C&S, abx deescalation / LOT  Height: 4\' 11"  (149.9 cm) Weight: 102 lb (46.3 kg) IBW/kg (Calculated) : 43.2  Temp (24hrs), Avg:98.7 F (37.1 C), Min:98.5 F (36.9 C), Max:99.1 F (37.3 C)   Recent Labs Lab 01/10/17 2203 01/12/17 0613 01/13/17 0524  WBC 11.1*  --  11.2*  CREATININE 1.93* 2.29* 2.27*    Estimated Creatinine Clearance: 13.3 mL/min (A) (by C-G formula based on SCr of 2.27 mg/dL (H)).    Allergies  Allergen Reactions  . Clonidine Derivatives Palpitations and Other (See Comments)    Very sedated  . Codeine Nausea Only  . Nickel Rash  . Sulfa Antibiotics Other (See Comments)    Reaction unknown    Antimicrobials this admission: Cefepime 829 >>  Vancomycin 8/29 >>   Dose adjustments this admission: n/a  Microbiology results: 8/29 BCx: sent  Thank you for allowing pharmacy to be a part of this patient's care.  Elenor Quinones, PharmD, BCPS Clinical Pharmacist Pager [phone] 01/13/2017 12:59 PM

## 2017-01-13 NOTE — Progress Notes (Signed)
Patient still reporting shortness of breath. Sat 96% on 3L nansal canula. Respiratory therapist called to asses patient. Will continue to monitor patient.

## 2017-01-13 NOTE — Progress Notes (Addendum)
[place] for warfarin  Indication: atrial fibrillation  Allergies  Allergen Reactions  . Clonidine Derivatives Palpitations and Other (See Comments)    Very sedated  . Codeine Nausea Only  . Nickel Rash  . Sulfa Antibiotics Other (See Comments)    Reaction unknown   Assessment: 83 yo female admitted with wound check for head laceration. On Coumadin 3mg  daily exc for 1.5mg  on MWF PTA for hx afib. INR now elevated at 3.44 today. Hgb down to 8.3, plts wnl. No bleeding noted.  Goal of Therapy:  INR 2-3 Monitor platelets by anticoagulation protocol: Yes   Plan:  Hold Coumadin tonight Monitor daily INR, CBC, s/s of bleed  Elenor Quinones, PharmD, [facility] Clinical Pharmacist Pager [phone] 01/13/2017 7:55 AM

## 2017-01-13 NOTE — Evaluation (Signed)
Physical Therapy Evaluation Patient Details Name: Valerie Terrell MRN: [id number] DOB: Oct 08, 1932 Today's Date: 01/13/2017   History of Present Illness  Pt is an 83 y.o. female who presented to the ED for evaluation of bleeding head wound and R back pain after a fall. In the ED, hypoxic at 88% on her supplemental oxygen, tachypneic, mildly hypertensive, chest x-ray showed nondisplaced right ninth rib fracture without complicating features as well as multiple old right rib fractures with healing. She was briefly placed on nonrebreather mask. Admitted for acute on chronic hypoxic respiratory failure secondary to acute on chronic systolic and diastolic CHF and chest wall splinting related to pain from rib fracture. PMH significant for: chronic combined diastolic and systolic CHF, NICM, stage IV chronic kidney disease, CAD, PAF on warfarin, home oxygen 3-4 L/m, HLD, HTN, and CVA.  Clinical Impression  Pt admitted with above diagnosis. Pt currently with functional limitations due to the deficits listed below (see PT Problem List). Pt currently, modA for bed mobility, and minA for transfers and ambulation of 3 feet with RW. Pt will benefit from skilled PT to increase their independence and safety with mobility to allow discharge to the venue listed below.       Follow Up Recommendations Home health PT;Supervision/Assistance - 24 hour    Equipment Recommendations  None recommended by PT       Precautions / Restrictions Precautions Precautions: Fall Restrictions Weight Bearing Restrictions: No      Mobility  Bed Mobility Overal bed mobility: Needs Assistance Bed Mobility: Supine to Sit     Supine to sit: Mod assist;HOB elevated     General bed mobility comments: modA for trunk to upright and scooting hips to EoB,   Transfers Overall transfer level: Needs assistance Equipment used: Rolling walker (2 wheeled) Transfers: Sit to/from Stand Sit to Stand: Min assist         General  transfer comment: minA for initiation from low surface, good power up and steadying with RW vc for reaching back to recliner for eccentric descent   Ambulation/Gait Ambulation/Gait assistance: Min assist Ambulation Distance (Feet): 3 Feet Assistive device: Rolling walker (2 wheeled) Gait Pattern/deviations: Step-to pattern;Shuffle;Decreased step length - right;Decreased step length - left;Trunk flexed Gait velocity: slowed Gait velocity interpretation: Below normal speed for age/gender General Gait Details: minA for steadying, vc for sequencing of RW, squaring up to recliner       Balance Overall balance assessment: Needs assistance Sitting-balance support: Bilateral upper extremity supported;No upper extremity supported Sitting balance-Leahy Scale: Fair     Standing balance support: Bilateral upper extremity supported Standing balance-Leahy Scale: Poor Standing balance comment: Reliant on external assistance or UE support.                              Pertinent Vitals/Pain Pain Assessment: No/denies pain    Home Living Family/patient expects to be discharged to:: Private residence Living Arrangements: Children Available Help at Discharge: Family ("most of the time") Type of Home: House Home Access: Stairs to enter Entrance Stairs-Rails: None Entrance Stairs-Number of Steps: 3 Home Layout: One level Home Equipment: Environmental consultant - 2 wheels;Cane - single point;Bedside commode Additional Comments: Pt active with HHPT    Prior Function Level of Independence: Needs assistance   Gait / Transfers Assistance Needed: Uses both cane and RW at times.   ADL's / Homemaking Assistance Needed: Intermittent assistance.        Hand Dominance   Dominant Hand:  Right    Extremity/Trunk Assessment   Upper Extremity Assessment Upper Extremity Assessment: Defer to OT evaluation    Lower Extremity Assessment Lower Extremity Assessment: RLE deficits/detail;Generalized  weakness RLE Deficits / Details: large bruise behind R knee    Cervical / Trunk Assessment Cervical / Trunk Assessment:  (brusing on R posterior trunk)  Communication   Communication: HOH (wears hearing aids but not using at time of eval)  Cognition Arousal/Alertness: Lethargic Behavior During Therapy: Flat affect Overall Cognitive Status: Within Functional Limits for tasks assessed Area of Impairment: Orientation                 Orientation Level: Disoriented to;Time Current Attention Level: Selective     Safety/Judgement: Decreased awareness of deficits Awareness: Emergent   General Comments: decreased awareness of functional deficits and safety      General Comments General comments (skin integrity, edema, etc.): at rest BP 136/54, SaO2 97%O2 on 3L O2 via nasal cannula, SaO2 >90%O2 throughout session        Assessment/Plan    PT Assessment Patient needs continued PT services  PT Problem List Decreased strength;Decreased activity tolerance;Decreased balance;Decreased mobility;Decreased safety awareness       PT Treatment Interventions DME instruction;Gait training;Stair training;Functional mobility training;Therapeutic activities;Therapeutic exercise;Balance training;Patient/family education    PT Goals (Current goals can be found in the Care Plan section)  Acute Rehab PT Goals Patient Stated Goal: to go home PT Goal Formulation: With patient Time For Goal Achievement: 01/20/17 Potential to Achieve Goals: Fair    Frequency Min 3X/week    AM-PAC PT "6 Clicks" Daily Activity  Outcome Measure Difficulty turning over in bed (including adjusting bedclothes, sheets and blankets)?: Unable Difficulty moving from lying on back to sitting on the side of the bed? : Unable Difficulty sitting down on and standing up from a chair with arms (e.g., wheelchair, bedside commode, etc,.)?: A Lot Help needed moving to and from a bed to chair (including a wheelchair)?: A  Little Help needed walking in hospital room?: A Lot Help needed climbing 3-5 steps with a railing? : Total 6 Click Score: 10    End of Session Equipment Utilized During Treatment: Gait belt;Oxygen Activity Tolerance: Patient limited by fatigue Patient left: in chair;with call bell/phone within reach;with chair alarm set;with family/visitor present Nurse Communication: Mobility status PT Visit Diagnosis: Unsteadiness on feet (R26.81);Other abnormalities of gait and mobility (R26.89);Repeated falls (R29.6);Muscle weakness (generalized) (M62.81);History of falling (Z91.81);Difficulty in walking, not elsewhere classified (R26.2)    Time: 7989-2119 PT Time Calculation (min) (ACUTE ONLY): 29 min   Charges:   PT Evaluation $PT Eval Moderate Complexity: 1 Mod PT Treatments $Therapeutic Activity: 8-22 mins   PT G Codes:        Ayrabella Labombard B. Migdalia Dk PT, DPT Acute Rehabilitation  [phone] Pager [phone]    [place] 01/13/2017, 12:35 PM

## 2017-01-13 NOTE — Progress Notes (Signed)
Patient alert and oriented. Po medication given for pain. Patient's sat 96% on 3l. Encouraged patient to use incentives spirometer. Report given to oncoming nurse. Daughter updated on patient's status.

## 2017-01-13 NOTE — Progress Notes (Signed)
Went in to speak to patient explain importance of deep breathing exercises and use of pillow for splinting; Encourage to use incentive spirometer; IV on rt AC dc'd and moved to rt hand with 20G. No other problems noted. Patient receptive to teachings.

## 2017-01-14 ENCOUNTER — Ambulatory Visit: Payer: Self-pay

## 2017-01-14 ENCOUNTER — Telehealth: Payer: Self-pay | Admitting: Pharmacist Clinician (PhC)/ Clinical Pharmacy Specialist

## 2017-01-14 LAB — PROTIME-INR
INR: 3.86
PROTHROMBIN TIME: 37.6 s — AB (ref 11.4–15.2)

## 2017-01-14 LAB — RETICULOCYTES
RBC.: 2.76 MIL/uL — ABNORMAL LOW (ref 3.87–5.11)
RETIC CT PCT: 2.3 % (ref 0.4–3.1)
Retic Count, Absolute: 63.5 10*3/uL (ref 19.0–186.0)

## 2017-01-14 LAB — IRON AND TIBC
Iron: 95 ug/dL (ref 28–170)
SATURATION RATIOS: 44 % — AB (ref 10.4–31.8)
TIBC: 214 ug/dL — AB (ref 250–450)
UIBC: 119 ug/dL

## 2017-01-14 LAB — RENAL FUNCTION PANEL
ALBUMIN: 3.2 g/dL — AB (ref 3.5–5.0)
Anion gap: 8 (ref 5–15)
BUN: 56 mg/dL — AB (ref 6–20)
CALCIUM: 9.7 mg/dL (ref 8.9–10.3)
CO2: 33 mmol/L — AB (ref 22–32)
CREATININE: 2.28 mg/dL — AB (ref 0.44–1.00)
Chloride: 96 mmol/L — ABNORMAL LOW (ref 101–111)
GFR calc Af Amer: 22 mL/min — ABNORMAL LOW (ref >60)
GFR calc non Af Amer: 19 mL/min — ABNORMAL LOW (ref >60)
GLUCOSE: 128 mg/dL — AB (ref 65–99)
PHOSPHORUS: 4 mg/dL (ref 2.5–4.6)
Potassium: 4.5 mmol/L (ref 3.5–5.1)
SODIUM: 137 mmol/L (ref 135–145)

## 2017-01-14 LAB — CBC
HCT: 27.2 % — ABNORMAL LOW (ref 36.0–46.0)
HEMOGLOBIN: 8.4 g/dL — AB (ref 12.0–15.0)
MCH: 29.8 pg (ref 26.0–34.0)
MCHC: 30.9 g/dL (ref 30.0–36.0)
MCV: 96.5 fL (ref 78.0–100.0)
Platelets: 228 10*3/uL (ref 150–400)
RBC: 2.82 MIL/uL — ABNORMAL LOW (ref 3.87–5.11)
RDW: 15.9 % — ABNORMAL HIGH (ref 11.5–15.5)
WBC: 12.5 10*3/uL — ABNORMAL HIGH (ref 4.0–10.5)

## 2017-01-14 LAB — VITAMIN B12: VITAMIN B 12: 334 pg/mL (ref 180–914)

## 2017-01-14 LAB — FOLATE: Folate: 4.7 ng/mL — ABNORMAL LOW (ref >5.9)

## 2017-01-14 LAB — FERRITIN: Ferritin: 397 ng/mL — ABNORMAL HIGH (ref 11–307)

## 2017-01-14 MED ORDER — ACETAMINOPHEN 500 MG PO TABS
1000.0000 mg | ORAL_TABLET | Freq: Every day | ORAL | Status: DC
  Administered 2017-01-14 – 2017-02-01 (×13): 1000 mg via ORAL
  Filled 2017-01-14 (×18): qty 2

## 2017-01-14 MED ORDER — POLYETHYLENE GLYCOL 3350 17 G PO PACK
17.0000 g | PACK | Freq: Every day | ORAL | Status: DC
  Administered 2017-01-14 – 2017-01-20 (×6): 17 g via ORAL
  Filled 2017-01-14 (×10): qty 1

## 2017-01-14 NOTE — Progress Notes (Signed)
PROGRESS NOTE   Valerie Terrell  YIF:[id number]    DOB: 06/10/1932    DOA: 01/10/2017  PCP: Tammi Sou, MD   I have briefly reviewed patients previous medical records in [facility].  Brief Narrative:   36 cf chronic combined diastolic and systolic CHF--Last EF 12/6765 ~ 50-55%, stage IV chronic kidney disease with atretic L kidney [no blood flow by RAS-Dr. Gwenlyn Found 10.2017]--baseline creat 2-3 CAD PAF/sick sinus s/p PPM on warfarin CHad2Vasc2 score=6,  home oxygen 3-4 L/m, HLD,  HTN,  Occular CVA 9.14.17,   presented to the ED on 01/11/17 for evaluation of bleeding head wound and right back pain after a fall.  Has been admitted ~ 3-4 times in the past 2 months with AECHF and other issues She had been seen in the ED a week ago after falling and striking her head at which time the laceration was closed with suture but patient apparently started picking at it and then had difficulty controlling bleeding.   She also sustained a mechanical ground-level fall at home and reported right sided back pain worse with chest wall movements or deep inspiration.   In the ED, hypoxic at 88% on her supplemental oxygen, tachypneic, mildly hypertensive, chest x-ray showed nondisplaced right ninth rib fracture without complicating features as well as multiple old right rib fractures with healing.   he was briefly placed on nonrebreather mask. Admitted for acute on chronic hypoxic respiratory failure secondary to acute on chronic systolic and diastolic CHF and chest wall splinting related to pain from rib fracture.   Assessment & Plan:   Principal Problem:   Acute on chronic respiratory failure with hypoxia (HCC) Active Problems:   PAF (paroxysmal atrial fibrillation) (HCC)   Essential hypertension   CKD (chronic kidney disease), stage IV (HCC)   Acute combined systolic and diastolic heart failure (HCC) - possibly related to A. fib   Fracture of one rib, right side, initial encounter for  closed fracture   Acute hypoxic resp failure Acute decompnesated hf-both sys/diastolic--Last EF 20-94%--BSJG like 50-55% per Dr. Gwenlyn Found note in the past RIb fractures causing splinting--Possible Atelectasis of PNa on CXR 8.29  Last d/c 8.2 on 160 bid lasix--on admit lasix 60 iv bid--8/28--40 IV bid.  Now 40 po bid-need OP f/u Renal-renal function has stabilized to 2.27 creat   use Percocet as 1st choice for pain, d/c IV morphine 8/29 2/2 sleepiness   Flector -helping with rib pain   stressed importance of IS--up with PT when able  desat screen to home oxygen levels as tol  I/o - 1.5 liters, weights inaccurate   ? Aspiration PNa vs HCAP 8.29 cxr shows ? Increased R Retrocardiac opacity--will start Cefepime /vanc for Rx  speech feel ok for reg diet with compensation strategies  P afib, CHAD=5 Prior CAD h/o  Cont Amio 200 bid, coreg 25 bid, Imdur 60 qd, Amlodipine 5 qd  Is in NSR at bedside  INR therapeutic-3.8 today--PharmD to dose  Htn See above discussion Uncontrolled 2/2 to pain  Stg IV CKD Anemia likely renal disease  Ferreheme x 1 8/28  Monitor kidney fucntion--stable as above with lasix IV  sATURATION RATIOS ADEQUATE--DOENST NEED epo RIGHT NOW  Prior Occular CVa and SAH 2/2 to fall  Has HH  Needs re-eval by PT  High dose Pravachol 80 [change from 40 PTA}  On coumadin--Unclear if good idea to start antiplt with falls + bleed etc.    DVT prophylaxis: Coumadin per pharmacy Code Status: Full Family  Communication: talked with daughter who she resides wit at bedisde Disposition: DC home when medically improved--[place] ~ 48 hours   Consultants:  None   Procedures:  None  Antimicrobials:  None    Subjective:  Awake alert  Has pain in back some-frustrated Pain 5/10 No n/v Eating some  Objective:  Vitals:   01/14/17 0040 01/14/17 0419 01/14/17 0800 01/14/17 1413  BP:  (!) 204/77 (!) 163/48 (!) 137/47  Pulse:  72 73 65  Resp:  18  18  Temp:  97.6 F  (36.4 C) 98.4 F (36.9 C) 98.4 F (36.9 C)  TempSrc:  Oral Oral Oral  SpO2: 97% 96% 96% 98%  Weight:  53.5 kg (118 lb)    Height:        Examination:   eomi frail-hematoma R forehead about the same s1 s2 rrr, nsr, 1/6 hsm Mild jvd, mild le edema Poor xam today--lung sounds equivocal rom intact, neuro CN x 12 grsolly intact No rash No pallor, no ict, no lan   Data Reviewed: I have personally reviewed following labs and imaging studies  CBC:  Recent Labs Lab 01/10/17 2203 01/13/17 0524 01/14/17 0439  WBC 11.1* 11.2* 12.5*  NEUTROABS 8.6* 8.8*  --   HGB 10.0* 8.3* 8.4*  HCT 31.3* 27.4* 27.2*  MCV 95.4 95.8 96.5  PLT 217 209 712   Basic Metabolic Panel:  Recent Labs Lab 01/10/17 2203 01/12/17 0613 01/13/17 0524 01/14/17 0439  NA 134* 137 135 137  K 4.0 4.0 4.2 4.5  CL 92* 92* 93* 96*  CO2 30 33* 32 33*  GLUCOSE 125* 105* 107* 128*  BUN 51* 55* 53* 56*  CREATININE 1.93* 2.29* 2.27* 2.28*  CALCIUM 8.8* 9.3 9.2 9.7  PHOS  --   --   --  4.0   Liver Function Tests:  Recent Labs Lab 01/10/17 2203 01/13/17 0524 01/14/17 0439  AST 25 20  --   ALT 17 14  --   ALKPHOS 59 51  --   BILITOT 0.5 0.7  --   PROT 7.0 6.4*  --   ALBUMIN 3.6 3.2* 3.2*   Coagulation Profile:  Recent Labs Lab 01/10/17 2203 01/11/17 0117 01/12/17 0613 01/13/17 0524 01/14/17 0439  INR 1.99 1.95 2.44 3.45 3.86   Cardiac Enzymes: No results for input(s): CKTOTAL, CKMB, CKMBINDEX, TROPONINI in the last 168 hours. HbA1C: No results for input(s): HGBA1C in the last 72 hours. CBG: No results for input(s): GLUCAP in the last 168 hours.  Recent Results (from the past 240 hour(s))  Culture, blood (routine x 2) Call MD if unable to obtain prior to antibiotics being given     Status: None (Preliminary result)   Collection Time: 01/13/17 12:55 PM  Result Value Ref Range Status   Specimen Description BLOOD LEFT HAND  Final   Special Requests IN PEDIATRIC BOTTLE Blood Culture  adequate volume  Final   Culture NO GROWTH < 24 HOURS  Final   Report Status PENDING  Incomplete  Culture, blood (routine x 2) Call MD if unable to obtain prior to antibiotics being given     Status: None (Preliminary result)   Collection Time: 01/13/17 12:56 PM  Result Value Ref Range Status   Specimen Description BLOOD LEFT FOREARM  Final   Special Requests IN PEDIATRIC BOTTLE Blood Culture adequate volume  Final   Culture NO GROWTH < 24 HOURS  Final   Report Status PENDING  Incomplete         Radiology  Studies: Dg Chest 2 View  Result Date: 01/13/2017 CLINICAL DATA:  Recent fall with right lower rib fractures, respiratory distress with shortness of breath. Fatigue. Low oxygen saturation. EXAM: CHEST  2 VIEW COMPARISON:  Chest x-ray of January 10, 2017 FINDINGS: The lungs are adequately inflated. The interstitial markings remain increased. There is persistent left retrocardiac density. The pulmonary vascularity is engorged. The cardiac silhouette is enlarged. There is calcification in the wall of the thoracic aorta. The ICD is in stable position. The patient has undergone previous kyphoplasty of T9. IMPRESSION: CHF with interstitial edema. Persistent density in the retrocardiac region may reflect atelectasis or pneumonia. Electronically Signed   By: David  Martinique M.D.   On: 01/13/2017 10:03    Scheduled Meds: . acetaminophen  1,000 mg Oral QHS  . amiodarone  200 mg Oral BID  . amLODipine  5 mg Oral Daily  . calcium-vitamin D  1 tablet Oral BID  . carvedilol  25 mg Oral BID WC  . diclofenac  1 patch Transdermal BID  . furosemide  40 mg Oral BID  . hydrALAZINE  100 mg Oral TID  . isosorbide mononitrate  60 mg Oral Daily  . multivitamin with minerals  1 tablet Oral Once per day on Mon Wed Fri  . polyethylene glycol  17 g Oral Daily  . pravastatin  80 mg Oral q1800  . sodium chloride flush  3 mL Intravenous Q12H  . vitamin C  500 mg Oral Daily  . Warfarin - Pharmacist Dosing  Inpatient   Does not apply q1800   Continuous Infusions: . sodium chloride    . ceFEPime (MAXIPIME) IV Stopped (01/13/17 1330)  . ferumoxytol Stopped (01/12/17 1015)  . [START ON 01/15/2017] vancomycin       LOS: 3 days  Verneita Griffes, MD [facility] [phone]

## 2017-01-14 NOTE — Progress Notes (Signed)
CM talked to patient with her daughter at the bedside about DCP; she does not want SNF and is requesting to return home with [facility] services provided by [facility]; daughter is asking for more services, offered; CM offered private sitter list but they do not want it at this time (private pay). Patient stated " I want to stay in the hospital until I am stronger." CM talked to patient / daughter about SNF for rehab prior to going home. Patient stated "my body needs it but my mind doesn't want it." Emotional support given. CM will continue to follow for DCP; B Pennie Rushing [phone]

## 2017-01-14 NOTE — Progress Notes (Signed)
Occupational Therapy Treatment Patient Details Name: Valerie Terrell MRN: [id number] DOB: Oct 16, 1932 Today's Date: 01/14/2017    History of present illness Pt is an 83 y.o. female who presented to the ED for evaluation of bleeding head wound and R back pain after a fall. In the ED, hypoxic at 88% on her supplemental oxygen, tachypneic, mildly hypertensive, chest x-ray showed nondisplaced right ninth rib fracture without complicating features as well as multiple old right rib fractures with healing. She was briefly placed on nonrebreather mask. Admitted for acute on chronic hypoxic respiratory failure secondary to acute on chronic systolic and diastolic CHF and chest wall splinting related to pain from rib fracture. PMH significant for: chronic combined diastolic and systolic CHF, NICM, stage IV chronic kidney disease, CAD, PAF on warfarin, home oxygen 3-4 L/m, HLD, HTN, and CVA.   OT comments  Pt slowly progressing toward acute OT goals. Focus of session was bed mobility, toilet transfer (SPT), pericare, and fall prevention strategies. Daughter present during session. Pt continues to refuse SNF for rehab, would benefit from [place] services as she is from home alone.    Follow Up Recommendations  Home health OT;Other (comment) (Pt adamantly against SNF.)    Equipment Recommendations  3 in 1 bedside commode;Tub/shower seat    Recommendations for Other Services      Precautions / Restrictions Precautions Precautions: Fall Restrictions Weight Bearing Restrictions: No       Mobility Bed Mobility Overal bed mobility: Needs Assistance Bed Mobility: Supine to Sit     Supine to sit: Mod assist;HOB elevated Sit to supine: Min assist   General bed mobility comments: pt relying on bed rail, HOB elevated. Discussed modifications to bed at home.   Transfers Overall transfer level: Needs assistance Equipment used: Rolling walker (2 wheeled) Transfers: Sit to/from Stand Sit to Stand:  Min assist         General transfer comment: minA for initiation from low surface, good power up and steadying with RW vc for reaching back to recliner for eccentric descent     Balance Overall balance assessment: Needs assistance Sitting-balance support: Bilateral upper extremity supported;No upper extremity supported Sitting balance-Leahy Scale: Fair     Standing balance support: Bilateral upper extremity supported Standing balance-Leahy Scale: Poor Standing balance comment: Reliant on external assistance or UE support.                            ADL either performed or assessed with clinical judgement   ADL Overall ADL's : Needs assistance/impaired                         Toilet Transfer: Minimal assistance;Stand-pivot;BSC;RW Toilet Transfer Details (indicate cue type and reason): Decreased activity tolerance and pt fatigues quickly. Pt very fearful of falling.  Toileting- Clothing Manipulation and Hygiene: Moderate assistance;Sit to/from stand         General ADL Comments: Pt completed SPT EOB<>BSC. Fatigued. Daughter present. Discussed pt having had 2 falls recently while trying to get to toilet including a fall during SPT to [facility]. Discussed trying to void on a routine schedule vs. always waiting until she feels the need to void. Pt reports sense of urgency to void, "It comes on all the sudden."     Vision       Perception     Praxis      Cognition Arousal/Alertness: Lethargic Behavior During Therapy: Flat affect Overall Cognitive  Status: Impaired/Different from baseline Area of Impairment: Safety/judgement;Problem solving;Awareness                   Current Attention Level: Selective     Safety/Judgement: Decreased awareness of deficits Awareness: Emergent Problem Solving: Slow processing          Exercises     Shoulder Instructions       General Comments      Pertinent Vitals/ Pain       Pain Assessment: Faces Faces  Pain Scale: Hurts little more Pain Location: R ribs Pain Descriptors / Indicators: Aching;Grimacing;Sore Pain Intervention(s): Monitored during session;Repositioned  Home Living                                          Prior Functioning/Environment              Frequency  Min 2X/week        Progress Toward Goals  OT Goals(current goals can now be found in the care plan section)  Progress towards OT goals: Progressing toward goals  Acute Rehab OT Goals Patient Stated Goal: to go home OT Goal Formulation: With patient Time For Goal Achievement: 01/26/17 Potential to Achieve Goals: Good ADL Goals Pt Will Perform Grooming: standing;with supervision Pt Will Perform Upper Body Dressing: with modified independence;sitting Pt Will Perform Lower Body Dressing: with supervision;with adaptive equipment;sit to/from stand Pt Will Transfer to Toilet: with supervision;ambulating;bedside commode Pt Will Perform Toileting - Clothing Manipulation and hygiene: with supervision;sit to/from stand Additional ADL Goal #1: Pt will independently verbalize 3 strategies to conserve energy during morning ADL routine.  Plan Discharge plan remains appropriate    Co-evaluation                 AM-PAC PT "6 Clicks" Daily Activity     Outcome Measure   Help from another person eating meals?: A Little Help from another person taking care of personal grooming?: A Little Help from another person toileting, which includes using toliet, bedpan, or urinal?: A Lot Help from another person bathing (including washing, rinsing, drying)?: A Lot Help from another person to put on and taking off regular upper body clothing?: A Little Help from another person to put on and taking off regular lower body clothing?: A Lot 6 Click Score: 15    End of Session Equipment Utilized During Treatment: Gait belt;Rolling walker  OT Visit Diagnosis: Unsteadiness on feet (R26.81);Muscle weakness  (generalized) (M62.81);Pain Pain - Right/Left: Right   Activity Tolerance Patient limited by fatigue   Patient Left in bed;with call bell/phone within reach;with family/visitor present   Nurse Communication          Time: 2694-8546 OT Time Calculation (min): 30 min  Charges: OT General Charges $OT Visit: 1 Visit OT Treatments $Self Care/Home Management : 23-37 mins     Hortencia Pilar 01/14/2017, 12:54 PM

## 2017-01-14 NOTE — Progress Notes (Addendum)
Pt had already ambulated 2x today, and then refused another walk at time of O2 desaturation assessment.  Staff was able to convince pt to walk short distance with walker, from bedside to hallway and back to chair.

## 2017-01-14 NOTE — Progress Notes (Signed)
SATURATION QUALIFICATIONS: (This note is used to comply with regulatory documentation for home oxygen)  Patient Saturations on Room Air at Rest = 80%  Patient Saturations on Room Air while Ambulating = 71%  Patient Saturations on 4 Liters of oxygen while Ambulating = 89-93%  Please briefly explain why patient needs home oxygen:  Pt already on Home O2

## 2017-01-14 NOTE — Progress Notes (Signed)
ANTICOAGULATION CONSULT NOTE - Follow Up Consult  Pharmacy Consult for Coumadin Indication: atrial fibrillation  Allergies  Allergen Reactions  . Clonidine Derivatives Palpitations and Other (See Comments)    Very sedated  . Codeine Nausea Only  . Nickel Rash  . Sulfa Antibiotics Other (See Comments)    Reaction unknown    Patient Measurements: Height: 4\' 11"  (149.9 cm) Weight: 118 lb (53.5 kg) IBW/kg (Calculated) : 43.2  Vital Signs: Temp: 98.4 F (36.9 C) (08/30 0800) Temp Source: Oral (08/30 0800) BP: 163/48 (08/30 0800) Pulse Rate: 73 (08/30 0800)  Labs:  Recent Labs  01/12/17 0613 01/13/17 0524 01/14/17 0439  HGB  --  8.3* 8.4*  HCT  --  27.4* 27.2*  PLT  --  209 228  LABPROT 27.0* 34.5* 37.6*  INR 2.44 3.45 3.86  CREATININE 2.29* 2.27* 2.28*    Estimated Creatinine Clearance: 14.4 mL/min (A) (by C-G formula based on SCr of 2.28 mg/dL (H)).  Assessment:    83 yr old female on Coumadin for afib.  INR supratherapeutic at 3.86. Up from 3.45 yesterday.  Last Coumadin dose given on 8/28.  Hemoglobin low, consistent with 8/29 value. Head laceration/hematoma above right eye due to fall 12/31/16, which required suture.  Patient feels as if area has enlarged but reports no bleeding.    Home Coumadin regimen: 3 mg TTSS, 1.5 mg MWF     INR 1.99 on admit 8/26  Goal of Therapy:  INR 2-3 Monitor platelets by anticoagulation protocol: Yes   Plan:   No Coumadin again today.  Daily PT/INR.  Monitor for any bleeding.  Arty Baumgartner, [place] Pager: [phone], or [phone] 01/14/2017,1:03 PM

## 2017-01-14 NOTE — Evaluation (Addendum)
Clinical/Bedside Swallow Evaluation Patient Details  Name: Valerie Terrell MRN: [id number] Date of Birth: May 28, 1932  Today's Date: 01/14/2017 Time: SLP Start Time (ACUTE ONLY): 75 SLP Stop Time (ACUTE ONLY): 1049 SLP Time Calculation (min) (ACUTE ONLY): 9 min  Past Medical History:  Past Medical History:  Diagnosis Date  . Atrophy of left kidney    with absent blood flow by renal artery dopplers (Dr. Gwenlyn Found)  . 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 Chronic combined systolic and diastolic CHF (congestive heart failure) ([place])    a. 11/2016: echo showing EF of 35-40%, RV strain noted, mild MR and mild TR.   [name] Chronic renal insufficiency, stage 4 (severe) (HCC)    Baseline GFR 25 ml/min-- renal u/s showed atrophic/hypoplastic left kidney.  No hydronephrosis.  Small right renal cyst.  . History of adenomatous polyp of colon   . History of subarachnoid hemorrhage 10/2014   after syncope and while on xarelto  . Hyperlipidemia   . Hypertension    Difficult to control, in the setting of one functioning kidney: pt was referred to nephrology by Dr. Gwenlyn Found 06/2015.  . Lumbar radiculopathy 2012  . Lumbar spondylosis    MR 07/2016---no sign of spinal nerve compression or cord compression.  Pt set up with outpt ortho while admitted to hosp 07/2016.  . Metatarsal fracture 06/10/2016   Nondisplaced, left 5th metatarsal--pt was referred to ortho  . Nonischemic cardiomyopathy ([place])   . Osteopenia 2014   T-score -2.1  . PAF (paroxysmal atrial fibrillation) (HCC)    Eliquis started after BRAO and CVA.  She was changed to warfarin 2018.  . Pulmonary edema 11/2016  . Stroke [facility])    cardioembolic (had CVA while on no anticoag)--"scattered subacute punctate infarcts: 1 in R parietal lobe and 2 in occipital cortex" on MRI br.  CT angio head/neck: aortic arch athero.   . Thoracic compression fracture ([place]) 07/2016   T7 and T8-- T8 kyphoplasty during hosp admission 07/2016.  Neuro referred pt to  pain mgmt for consideration of injection 09/2016.  I referred her to endo 08/2016 for consideration of calcitonin treatment.   Past Surgical History:  Past Surgical History:  Procedure Laterality Date  . APPENDECTOMY  child  . CARDIOVASCULAR STRESS TEST  01/31/2016   Stress myoview: NORMAL/Low risk.  EF 56%.  . [place]  . COLONOSCOPY  2015   + hx of adenomatous polyps.  Need digest health spec in [place] records to see when pt due for next colonoscopy  . IR GENERIC HISTORICAL  07/24/2016   IR KYPHO THORACIC WITH BONE BIOPSY 07/24/2016 Luanne Bras, MD [facility]-INTERV RAD  . KYPHOPLASTY  07/27/2016   T8  . PACEMAKER IMPLANT N/A 12/03/2016   Procedure: Pacemaker Implant;  Surgeon: Evans Lance, MD;  Location: [facility] CV LAB;  Service: Cardiovascular;  Laterality: N/A;  . Renal artery dopplers  02/26/2016   Her right renal dimension was 11 cm pole to pole with mild to moderate right renal artery stenosis. A right renal aortic ratio was 3.22 suggesting less than a 50% stenosis.  . TONSILLECTOMY    . TRANSTHORACIC ECHOCARDIOGRAM  01/29/2016; 11/2016   2017: EF 55-60%, normal LV wall motion, grade I DD.  No cardiac source of emboli was seen.  2018: EF 35-40%, could not assess DD due to a-fib, pulm HTN noted.   HPI:  Valerie Terrell a 83 y.o.femalewith medical history significant for chronic systolic CHF, CVA, chronic kidney disease stage IV,  coronary artery disease, and paroxysmal atrial fibrillation on warfarin, now presenting to the emergency department for evaluation of a bleeding head wound and right back pain after a fall. now with acute on chronic respiratory failure with hypoxia. MD questioning aspiration pna versus HCAP. CXR CHF with interstitial edema. Persistent density in the retrocardiac region may reflect atelectasis or pneumonia.   Assessment / Plan / Recommendation Clinical Impression  Pt denied difficulty swallow thin or solids prior to admission. Solid and thin  water by spoon were consumed without indications of airway compromise or pharyngeal dysphagia. No chest or pharyngeal congestion present. Appears to have low aspiration risk at this time. No prior ST notes for swallow in chart. Recommend pt continue regular texture/thin liquids, straws allowed and pt prefers to take pills whole in applesauce. Will sign off.   SLP Visit Diagnosis: Dysphagia, unspecified (R13.10)    Aspiration Risk  No limitations    Diet Recommendation Regular;Thin liquid   Liquid Administration via: Cup;Straw Medication Administration:  (pt preference is applesauce) Supervision: Patient able to self feed Compensations: Small sips/bites Postural Changes: Seated upright at 90 degrees    Other  Recommendations Oral Care Recommendations: Oral care BID   Follow up Recommendations None      Frequency and Duration            Prognosis        Swallow Study   General HPI: Valerie Terrell a 83 y.o.femalewith medical history significant for chronic systolic CHF, CVA, chronic kidney disease stage IV, coronary artery disease, and paroxysmal atrial fibrillation on warfarin, now presenting to the emergency department for evaluation of a bleeding head wound and right back pain after a fall. now with acute on chronic respiratory failure with hypoxia. MD questioning aspiration pna versus HCAP. CXR CHF with interstitial edema. Persistent density in the retrocardiac region may reflect atelectasis or pneumonia. Type of Study: Bedside Swallow Evaluation Previous Swallow Assessment:  (none) Diet Prior to this Study: Regular;Thin liquids Temperature Spikes Noted: No Respiratory Status: Nasal cannula History of Recent Intubation: No Behavior/Cognition: Alert;Cooperative Oral Cavity Assessment: Within Functional Limits Oral Care Completed by SLP: No Oral Cavity - Dentition: Adequate natural dentition Vision: Functional for self-feeding Self-Feeding Abilities: Able to feed  self Patient Positioning: Upright in bed Baseline Vocal Quality: Normal Volitional Cough: Strong Volitional Swallow: Able to elicit    Oral/Motor/Sensory Function Overall Oral Motor/Sensory Function: Within functional limits   Ice Chips Ice chips: Not tested   Thin Liquid Thin Liquid: Within functional limits Presentation: Straw    Nectar Thick Nectar Thick Liquid: Not tested   Honey Thick Honey Thick Liquid: Not tested   Puree Puree: Not tested   Solid   GO   Solid: Within functional limits        Houston Siren 01/14/2017,10:56 AM   Orbie Pyo Colvin Caroli.Ed [employer] [phone]

## 2017-01-14 NOTE — Telephone Encounter (Signed)
Coumadin letter 

## 2017-01-15 ENCOUNTER — Inpatient Hospital Stay (HOSPITAL_COMMUNITY): Payer: Medicare Other

## 2017-01-15 LAB — RENAL FUNCTION PANEL
Albumin: 3.1 g/dL — ABNORMAL LOW (ref 3.5–5.0)
Anion gap: 9 (ref 5–15)
BUN: 56 mg/dL — AB (ref 6–20)
CALCIUM: 9.5 mg/dL (ref 8.9–10.3)
CO2: 32 mmol/L (ref 22–32)
CREATININE: 2.23 mg/dL — AB (ref 0.44–1.00)
Chloride: 96 mmol/L — ABNORMAL LOW (ref 101–111)
GFR calc non Af Amer: 19 mL/min — ABNORMAL LOW (ref >60)
GFR, EST AFRICAN AMERICAN: 23 mL/min — AB (ref >60)
GLUCOSE: 114 mg/dL — AB (ref 65–99)
Phosphorus: 3.1 mg/dL (ref 2.5–4.6)
Potassium: 4 mmol/L (ref 3.5–5.1)
SODIUM: 137 mmol/L (ref 135–145)

## 2017-01-15 LAB — CBC WITH DIFFERENTIAL/PLATELET
BASOS PCT: 0 %
Basophils Absolute: 0 10*3/uL (ref 0.0–0.1)
EOS ABS: 0.2 10*3/uL (ref 0.0–0.7)
Eosinophils Relative: 3 %
HCT: 27.8 % — ABNORMAL LOW (ref 36.0–46.0)
Hemoglobin: 8.4 g/dL — ABNORMAL LOW (ref 12.0–15.0)
Lymphocytes Relative: 5 %
Lymphs Abs: 0.5 10*3/uL — ABNORMAL LOW (ref 0.7–4.0)
MCH: 28.9 pg (ref 26.0–34.0)
MCHC: 30.2 g/dL (ref 30.0–36.0)
MCV: 95.5 fL (ref 78.0–100.0)
MONOS PCT: 13 %
Monocytes Absolute: 1.1 10*3/uL — ABNORMAL HIGH (ref 0.1–1.0)
Neutro Abs: 6.7 10*3/uL (ref 1.7–7.7)
Neutrophils Relative %: 79 %
Platelets: 235 10*3/uL (ref 150–400)
RBC: 2.91 MIL/uL — ABNORMAL LOW (ref 3.87–5.11)
RDW: 15.5 % (ref 11.5–15.5)
WBC: 8.5 10*3/uL (ref 4.0–10.5)

## 2017-01-15 LAB — PROTIME-INR
INR: 3.35
PROTHROMBIN TIME: 33.7 s — AB (ref 11.4–15.2)

## 2017-01-15 MED ORDER — FUROSEMIDE 10 MG/ML IJ SOLN
40.0000 mg | Freq: Two times a day (BID) | INTRAMUSCULAR | Status: DC
  Administered 2017-01-15 – 2017-01-16 (×3): 40 mg via INTRAVENOUS
  Filled 2017-01-15 (×3): qty 4

## 2017-01-15 MED ORDER — FLEET ENEMA 7-19 GM/118ML RE ENEM
1.0000 | ENEMA | Freq: Every day | RECTAL | Status: DC | PRN
  Administered 2017-01-17 – 2017-01-19 (×2): 1 via RECTAL
  Filled 2017-01-15 (×3): qty 1

## 2017-01-15 MED ORDER — MORPHINE SULFATE (CONCENTRATE) 10 MG/0.5ML PO SOLN
5.0000 mg | ORAL | Status: AC | PRN
Start: 2017-01-15 — End: 2017-01-16
  Administered 2017-01-15 – 2017-01-16 (×3): 5 mg via ORAL
  Filled 2017-01-15 (×3): qty 0.5

## 2017-01-15 MED ORDER — BISACODYL 10 MG RE SUPP
10.0000 mg | Freq: Once | RECTAL | Status: AC
  Administered 2017-01-16: 10 mg via RECTAL
  Filled 2017-01-15: qty 1

## 2017-01-15 NOTE — Progress Notes (Signed)
OT Cancellation Note  Patient Details Name: Valerie Terrell MRN: [id number] DOB: 17-Jul-1932   Cancelled Treatment:     ATTEMPTED TO SEE PATIENT TIMES 2 ATTEMPTS. PATIENT DAUGHTER WAS UPSET SECONDARY PATIENT WAS HAVING DIFFICULTY BREATHING AND WAS IN PAIN. PATIENT DAUGHTER STATES THAT RAPID RESPONSE WAS CALLED IN EARLY AM SECONDARY TO BREATHING ISSUES BUT NURSING WANTED PATIENT MOBILIZED TODAY.   Kanani Mowbray 01/15/2017, 12:33 PM

## 2017-01-15 NOTE — Progress Notes (Addendum)
PT Cancellation Note  Patient Details Name: Valerie Terrell MRN: [id number] DOB: 11-28-1932   Cancelled Treatment:    Reason Eval/Treat Not Completed: Patient declined, no reason specified Daughter asked that PT check back later. Daughter reported pt is in respiratory distress. RN aware. PT will check on pt later as time allows.   [place] April, PTA Pager: [phone]   01/15/2017, 9:46 AM

## 2017-01-15 NOTE — Progress Notes (Signed)
ANTICOAGULATION CONSULT NOTE - Follow Up Consult  Pharmacy Consult for Coumadin Indication: atrial fibrillation  Allergies  Allergen Reactions  . Clonidine Derivatives Palpitations and Other (See Comments)    Very sedated  . Codeine Nausea Only  . Nickel Rash  . Sulfa Antibiotics Other (See Comments)    Reaction unknown    Patient Measurements: Height: 4\' 11"  (149.9 cm) Weight: 118 lb 2.7 oz (53.6 kg) IBW/kg (Calculated) : 43.2  Vital Signs: Temp: 97.8 F (36.6 C) (08/31 0407) Temp Source: Oral (08/31 0407) BP: 164/52 (08/31 0808) Pulse Rate: 73 (08/31 0808)  Labs:  Recent Labs  01/13/17 0524 01/14/17 0439 01/15/17 0337  HGB 8.3* 8.4* 8.4*  HCT 27.4* 27.2* 27.8*  PLT 209 228 235  LABPROT 34.5* 37.6* 33.7*  INR 3.45 3.86 3.35  CREATININE 2.27* 2.28* 2.23*    Estimated Creatinine Clearance: 14.8 mL/min (A) (by C-G formula based on SCr of 2.23 mg/dL (H)).  Assessment:    83 yr old female on Coumadin for afib.  INR remains supratherapeutic at 3.35, but now trending down.  Last Coumadin dose given on 8/28.  Hemoglobin low, consistent with 8/29 and 8/30 values. Head laceration/hematoma above right eye due to fall 12/31/16, which required suture.  Yesterday, patient reported that she felt as if area had enlarged but reported no bleeding. Respiratory issues this am, now asleep and on Venturi mask, so did not wake her.    Home Coumadin regimen: 3 mg TTSS, 1.5 mg MWF     INR 1.99 on admit 8/26  Goal of Therapy:  INR 2-3 Monitor platelets by anticoagulation protocol: Yes   Plan:   No Coumadin again today.  Daily PT/INR.  Monitor for any bleeding.  Arty Baumgartner, [place] Pager: [phone], or [phone] 01/15/2017,12:18 PM

## 2017-01-15 NOTE — Progress Notes (Signed)
5366 - Pt c/o shortness of breath, daughter at bedside.  Cristin, RN, CN, and Engineer, manufacturing assessed pt.  Pt was sitting up and hunched over in bed, O2 at 5L with [place], satting at 95%. Pt was initially placed on non-rebreather at 14L. 0726 - albuterol neb trt given.  0741 - BP 173/97, HR 71, O2 sats decreased to 80%, [place] on 5L.  RT and Hella (RRT), and Dr. Verlon Au were called. 4403 -  Hydralazine 10mg  IV given. 4742 - Pt O2 sats improved to 100%, on non-rebreather,at 14L. 0808 - BP 164/52, HR 73; pt placed on Venturi mask.  Pt continues to sat 100% on 14L. 0815 - chest xray performed, resulting with mild to moderate CHF. 0854 - morphine 5mg  oral solution given to pt. 0920 - re-assessed pain; pt still c/o pain.  Notifed Dr. Verlon Au, who requests that I give pt another morphine dose. 0950 - 2nd morphine 5mg  oral solution dose given to pt. 1015 - daughter leaves bedside, as pt is now resting comfortably; O2 sats remain 100% on venturi mask 14L.

## 2017-01-15 NOTE — Progress Notes (Signed)
Dr. Verlon Au requests that pt be placed on simple mask, and to attempt to wean pt back to her home O2 use of 4L. RT contacted; however, Kim, RT recommends that a simple mask only allows O2 to be set minimally at 6L.  Therefore, RT recommends that pt remain on Venturi mask so that we can wean pt to 4L eventually. Pt remains on Venturi mask, satting at 96% on 6L O2.  Will continue to monitor and attempt to wean back down to 4L.

## 2017-01-15 NOTE — Progress Notes (Signed)
PROGRESS NOTE   Valerie Terrell  CHE:[id number]    DOB: [birth year]    DOA: 01/10/2017  PCP: Tammi Sou, MD   I have briefly reviewed patients previous medical records in [facility].  Brief Narrative:   42 cf chronic combined diastolic and systolic CHF--Last EF 09/3612 ~ 50-55%, stage IV chronic kidney disease with atretic L kidney [no blood flow by RAS-Dr. Gwenlyn Found 10.2017]--baseline creat 2-3 CAD PAF/sick sinus s/p PPM on warfarin CHad2Vasc2 score=6,  home oxygen 3-4 L/m, HLD,  HTN,  Occular CVA 9.14.17,   presented to the ED on 01/11/17 for evaluation of bleeding head wound and right back pain after a fall.  Has been admitted ~ 3-4 times in the past 2 months with AECHF and other issues She had been seen in the ED a week ago after falling and striking her head at which time the laceration was closed with suture but patient apparently started picking at it and then had difficulty controlling bleeding.   She also sustained a mechanical ground-level fall at home and reported right sided back pain worse with chest wall movements or deep inspiration.   In the ED, hypoxic at 88% on her supplemental oxygen, tachypneic, mildly hypertensive, chest x-ray showed nondisplaced right ninth rib fracture without complicating features as well as multiple old right rib fractures with healing.   he was briefly placed on nonrebreather mask. Admitted for acute on chronic hypoxic respiratory failure secondary to acute on chronic systolic and diastolic CHF and chest wall splinting related to pain from rib fracture.   Assessment & Plan:   Principal Problem:   Acute on chronic respiratory failure with hypoxia (HCC) Active Problems:   PAF (paroxysmal atrial fibrillation) (HCC)   Essential hypertension   CKD (chronic kidney disease), stage IV (HCC)   Acute combined systolic and diastolic heart failure (HCC) - possibly related to A. fib   Fracture of one rib, right side, initial encounter for  closed fracture   Acute hypoxic resp failure--worsened on 8/31 Acute decompnesated hf-both sys/diastolic--Last EF 43-15%--QMGQ like 50-55% per Dr. Gwenlyn Found note in the past RIb fractures causing splinting--Possible Atelectasis of PNa on CXR 8.29  Last d/c 8.2 on 160 bid lasix--on admit lasix 60 iv bid--8/28-->>>40 IV bid. Transiently on oral lasix  Decompensated 8/31 am--placed back on lasix IV as CXr=fluid    d/c IV morphine 8/29 2/2 sleepiness, d/c percocet 8/31   Flector -helping with rib pain   stressed importance of IS--up with PT when able   Tylenol high dose 1000 qhs--adding roxanol 8/31  q 3 prn for pain and WOB/Anxiety  desat screen to home oxygen levels as tol  I/o - 1.0 liters, weights inaccurate   ? Aspiration PNa vs HCAP 8.29 cxr shows ? Increased R Retrocardiac opacity--will start Cefepime /vanc for Rx  speech feel ok for reg diet with compensation strategies  Get 2 vw CXR 9/1  P afib, CHAD=5 Prior CAD h/o Knot on head from fall is stable --no bleeding  Cont Amio 200 bid, coreg 25 bid, Imdur 60 qd, Amlodipine 5 qd  Is in NSR at bedside  INR therapeutic-3.3 today--PharmD to dose  Htn See above discussion Uncontrolled 2/2 to pain  Stg IV CKD Anemia likely renal disease  Ferreheme x 1 8/28  Monitor kidney fucntion--stable as above with lasix IV  sATURATION RATIOS ADEQUATE--DOENST NEED epo RIGHT NOW  Prior Occular CVa and SAH 2/2 to fall  Has HH  Needs re-eval by PT  High dose Pravachol  80 [change from 40 PTA}  On coumadin--Unclear if good idea to start antiplt with falls + bleed etc.    DVT prophylaxis: Coumadin per pharmacy Code Status: Full Family Communication: talked with daughter who she resides wit at bedisde Disposition: unclear--comoplicated dis[po--refusing skilled but not participating with therapy--will folow   Consultants:  None   Procedures:  None  Antimicrobials:  None    Subjective:  Am events noted and was active  participant Needed Venturi this am--seemed more anxious than anything--Sats were 100% Re-evaluated this pm and patient seems scared of using meds for pain or anxiety Expressing concern about whether she will recover  Objective:  Vitals:   01/15/17 0755 01/15/17 0808 01/15/17 1500 01/15/17 1525  BP: (!) 162/59 (!) 164/52    Pulse: 70 73    Resp:      Temp:      TempSrc:      SpO2:  100% 96% 96%  Weight:      Height:        Examination:   eomi frail-hematoma R forehead  s1 s2 rrr, nsr, 1/6 hsm Mild jvd, mild le edema Lung sounds some craxckle3s post R bases    Data Reviewed: I have personally reviewed following labs and imaging studies  CBC:  Recent Labs Lab 01/10/17 2203 01/13/17 0524 01/14/17 0439 01/15/17 0337  WBC 11.1* 11.2* 12.5* 8.5  NEUTROABS 8.6* 8.8*  --  6.7  HGB 10.0* 8.3* 8.4* 8.4*  HCT 31.3* 27.4* 27.2* 27.8*  MCV 95.4 95.8 96.5 95.5  PLT 217 209 228 993   Basic Metabolic Panel:  Recent Labs Lab 01/10/17 2203 01/12/17 0613 01/13/17 0524 01/14/17 0439 01/15/17 0337  NA 134* 137 135 137 137  K 4.0 4.0 4.2 4.5 4.0  CL 92* 92* 93* 96* 96*  CO2 30 33* 32 33* 32  GLUCOSE 125* 105* 107* 128* 114*  BUN 51* 55* 53* 56* 56*  CREATININE 1.93* 2.29* 2.27* 2.28* 2.23*  CALCIUM 8.8* 9.3 9.2 9.7 9.5  PHOS  --   --   --  4.0 3.1   Liver Function Tests:  Recent Labs Lab 01/10/17 2203 01/13/17 0524 01/14/17 0439 01/15/17 0337  AST 25 20  --   --   ALT 17 14  --   --   ALKPHOS 59 51  --   --   BILITOT 0.5 0.7  --   --   PROT 7.0 6.4*  --   --   ALBUMIN 3.6 3.2* 3.2* 3.1*   Coagulation Profile:  Recent Labs Lab 01/11/17 0117 01/12/17 0613 01/13/17 0524 01/14/17 0439 01/15/17 0337  INR 1.95 2.44 3.45 3.86 3.35   Cardiac Enzymes: No results for input(s): CKTOTAL, CKMB, CKMBINDEX, TROPONINI in the last 168 hours. HbA1C: No results for input(s): HGBA1C in the last 72 hours. CBG: No results for input(s): GLUCAP in the last 168  hours.  Recent Results (from the past 240 hour(s))  Culture, blood (routine x 2) Call MD if unable to obtain prior to antibiotics being given     Status: None (Preliminary result)   Collection Time: 01/13/17 12:55 PM  Result Value Ref Range Status   Specimen Description BLOOD LEFT HAND  Final   Special Requests IN PEDIATRIC BOTTLE Blood Culture adequate volume  Final   Culture NO GROWTH 2 DAYS  Final   Report Status PENDING  Incomplete  Culture, blood (routine x 2) Call MD if unable to obtain prior to antibiotics being given     Status:  None (Preliminary result)   Collection Time: 01/13/17 12:56 PM  Result Value Ref Range Status   Specimen Description BLOOD LEFT FOREARM  Final   Special Requests IN PEDIATRIC BOTTLE Blood Culture adequate volume  Final   Culture NO GROWTH 2 DAYS  Final   Report Status PENDING  Incomplete         Radiology Studies: Dg Chest Port 1 View  Result Date: 01/15/2017 CLINICAL DATA:  Hypoxia. EXAM: PORTABLE CHEST 1 VIEW COMPARISON:  01/13/2017 FINDINGS: Left chest wall pacer device is noted with lead in the right atrial appendage and right ventricle. There is cardiac enlargement and aortic atherosclerosis. Bilateral pleural effusions are noted left greater than right. Mild to moderate pulmonary edema. IMPRESSION: 1. Mild to moderate congestive heart failure. 2.  Aortic Atherosclerosis (ICD10-I70.0). Electronically Signed   By: Kerby Moors M.D.   On: 01/15/2017 08:19    Scheduled Meds: . acetaminophen  1,000 mg Oral QHS  . amiodarone  200 mg Oral BID  . amLODipine  5 mg Oral Daily  . bisacodyl  10 mg Rectal Once  . calcium-vitamin D  1 tablet Oral BID  . carvedilol  25 mg Oral BID WC  . diclofenac  1 patch Transdermal BID  . furosemide  40 mg Intravenous Q12H  . hydrALAZINE  100 mg Oral TID  . isosorbide mononitrate  60 mg Oral Daily  . multivitamin with minerals  1 tablet Oral Once per day on Mon Wed Fri  . polyethylene glycol  17 g Oral Daily  .  pravastatin  80 mg Oral q1800  . sodium chloride flush  3 mL Intravenous Q12H  . vitamin C  500 mg Oral Daily  . Warfarin - Pharmacist Dosing Inpatient   Does not apply q1800   Continuous Infusions: . sodium chloride    . ceFEPime (MAXIPIME) IV Stopped (01/15/17 1407)  . ferumoxytol Stopped (01/12/17 1015)  . vancomycin Stopped (01/15/17 1432)     LOS: 4 days  Verneita Griffes, MD [facility] [phone]

## 2017-01-15 NOTE — Significant Event (Signed)
Rapid Response Event Note  Overview: Time Called: 3664 Arrival Time: 0745 Event Type: Respiratory  Initial Focused Assessment: Patient with SOB this am.  She has rib fractures and endorses mild to moderate pain.  She is anxious and feels SOB.  De sat on 5L Echo 80%  Staff placed pt on  NRB Lung sounds clear, decreased bases.  Interventions: Weaned O2 to 50% [employer] Educated patient how to splint chest when coughing and deep breathing.  Plan of Care (if not transferred): Encouraged Patient to use IS 10 times every hour Encouraged patient to mobilize Continue to wean O2 as tolerated   Event Summary: Name of Physician Notified: Samtani at [place]    at    Outcome: Stayed in room and stabalized  Event End Time: 0830  Raliegh Ip

## 2017-01-15 NOTE — Care Management Important Message (Signed)
Important Message  Patient Details  Name: Valerie Terrell MRN: [id number] Date of Birth: [birth year]   Medicare Important Message Given:  Yes    Eletha Culbertson Abena 01/15/2017, 9:10 AM

## 2017-01-15 NOTE — Progress Notes (Signed)
Physical Therapy Treatment Patient Details Name: Valerie Terrell MRN: [id number] DOB: 1932-10-03 Today's Date: 01/15/2017    History of Present Illness Pt is an 83 y.o. female who presented to the ED for evaluation of bleeding head wound and R back pain after a fall. In the ED, hypoxic at 88% on her supplemental oxygen, tachypneic, mildly hypertensive, chest x-ray showed nondisplaced right ninth rib fracture without complicating features as well as multiple old right rib fractures with healing. She was briefly placed on nonrebreather mask. Admitted for acute on chronic hypoxic respiratory failure secondary to acute on chronic systolic and diastolic CHF and chest wall splinting related to pain from rib fracture. PMH significant for: chronic combined diastolic and systolic CHF, NICM, stage IV chronic kidney disease, CAD, PAF on warfarin, home oxygen 3-4 L/m, HLD, HTN, and CVA.    PT Comments    Patient agreeable to bilat LE therex in bed however declined OOB mobility. Therapist provided education on benefits of OOB mobility to pt's overall recovery and pt verbalized understanding. Pt on 6L O2 via Venturi mask with SpO2 93-95% during session and no SOB. Daughter present end of session. Pt encouraged to get OOB with nursing staff assist this evening for dinner and over the weekend. Continue to progress as tolerated.    Follow Up Recommendations  Home health PT;Supervision/Assistance - 24 hour     Equipment Recommendations  None recommended by PT    Recommendations for Other Services       Precautions / Restrictions Precautions Precautions: Fall Restrictions Weight Bearing Restrictions: No    Mobility  Bed Mobility                  Transfers                 General transfer comment: pt declined   Ambulation/Gait                 Stairs            Wheelchair Mobility    Modified Rankin (Stroke Patients Only)       Balance                                             Cognition Arousal/Alertness: Awake/alert Behavior During Therapy: Flat affect Overall Cognitive Status: Impaired/Different from baseline Area of Impairment: Following commands;Attention                   Current Attention Level: Selective   Following Commands: Follows one step commands inconsistently       General Comments: pt required cues for redirection to and follow through of tasks      Exercises General Exercises - Lower Extremity Ankle Circles/Pumps: AROM;Both;20 reps;Supine;Other (comment) (2 sets) Quad Sets: AROM;Both;15 reps;Supine Gluteal Sets: AROM;10 reps;Supine Heel Slides: AROM;Both;15 reps;Supine Hip ABduction/ADduction: AROM;Both;15 reps;Supine    General Comments General comments (skin integrity, edema, etc.): pt on 6L O2 via Venturi mask and SpO2 93-95%       Pertinent Vitals/Pain Pain Assessment: Faces Faces Pain Scale: No hurt    Home Living                      Prior Function            PT Goals (current goals can now be found in the care plan section) Acute Rehab PT  Goals PT Goal Formulation: With patient Time For Goal Achievement: 01/20/17 Potential to Achieve Goals: Fair Progress towards PT goals: Not progressing toward goals - comment    Frequency    Min 3X/week      PT Plan Current plan remains appropriate    Co-evaluation              AM-PAC PT "6 Clicks" Daily Activity  Outcome Measure  Difficulty turning over in bed (including adjusting bedclothes, sheets and blankets)?: Unable Difficulty moving from lying on back to sitting on the side of the bed? : Unable Difficulty sitting down on and standing up from a chair with arms (e.g., wheelchair, bedside commode, etc,.)?: A Lot Help needed moving to and from a bed to chair (including a wheelchair)?: A Lot Help needed walking in hospital room?: A Lot Help needed climbing 3-5 steps with a railing? : Total 6 Click Score:  9    End of Session Equipment Utilized During Treatment: Gait belt;Oxygen Activity Tolerance: Patient limited by fatigue;Other (comment) (limited by anxiety ) Patient left: with call bell/phone within reach;with family/visitor present;in bed Nurse Communication: Mobility status;Other (comment) (pt's call bell not working correctly) PT Visit Diagnosis: Unsteadiness on feet (R26.81);Other abnormalities of gait and mobility (R26.89);Repeated falls (R29.6);Muscle weakness (generalized) (M62.81);History of falling (Z91.81);Difficulty in walking, not elsewhere classified (R26.2)     Time: 1520-1550 PT Time Calculation (min) (ACUTE ONLY): 30 min  Charges:  $Therapeutic Exercise: 23-37 mins                    G Codes:       Earney Navy, PTA Pager: [phone]     Darliss Cheney 01/15/2017, 4:20 PM

## 2017-01-15 NOTE — Progress Notes (Signed)
Removed Venturi mask, and replaced with [place] O2 at 5L, pt satting at 100%.  Will continue to monitor and wean pt to home O2 usage of 4L.

## 2017-01-15 NOTE — Progress Notes (Signed)
Pt was calling for help, and RN came to her room and saw her O2 was off, pt said she was dreaming before and must of accidentally knocked off her O2, when RN took her O2 sats after putting the O2 back on, her sats read 100%, RN also gave pt a breathing tx to help her with this, shortly after this pt did feel better, pt seemed to be anxious, but did not want any med for this, VS were ok as well, will continue to monitor, THanks Arvella Nigh RN.

## 2017-01-16 ENCOUNTER — Inpatient Hospital Stay (HOSPITAL_COMMUNITY): Payer: Medicare Other

## 2017-01-16 DIAGNOSIS — N186 End stage renal disease: Secondary | ICD-10-CM

## 2017-01-16 DIAGNOSIS — Z992 Dependence on renal dialysis: Secondary | ICD-10-CM

## 2017-01-16 DIAGNOSIS — M546 Pain in thoracic spine: Secondary | ICD-10-CM

## 2017-01-16 HISTORY — DX: Pain in thoracic spine: M54.6

## 2017-01-16 HISTORY — DX: Dependence on renal dialysis: Z99.2

## 2017-01-16 HISTORY — DX: Dependence on renal dialysis: N18.6

## 2017-01-16 LAB — BASIC METABOLIC PANEL
Anion gap: 13 (ref 5–15)
BUN: 56 mg/dL — AB (ref 6–20)
CHLORIDE: 94 mmol/L — AB (ref 101–111)
CO2: 30 mmol/L (ref 22–32)
CREATININE: 2.33 mg/dL — AB (ref 0.44–1.00)
Calcium: 9.6 mg/dL (ref 8.9–10.3)
GFR calc Af Amer: 21 mL/min — ABNORMAL LOW (ref >60)
GFR calc non Af Amer: 18 mL/min — ABNORMAL LOW (ref >60)
GLUCOSE: 106 mg/dL — AB (ref 65–99)
POTASSIUM: 4.2 mmol/L (ref 3.5–5.1)
SODIUM: 137 mmol/L (ref 135–145)

## 2017-01-16 LAB — PROTIME-INR
INR: 3.19
Prothrombin Time: 32.4 seconds — ABNORMAL HIGH (ref 11.4–15.2)

## 2017-01-16 MED ORDER — FUROSEMIDE 10 MG/ML IJ SOLN
60.0000 mg | Freq: Two times a day (BID) | INTRAMUSCULAR | Status: DC
  Administered 2017-01-16 – 2017-01-19 (×6): 60 mg via INTRAVENOUS
  Filled 2017-01-16 (×6): qty 6

## 2017-01-16 MED ORDER — VANCOMYCIN HCL 500 MG IV SOLR: 500.0000 mg | INTRAVENOUS | Status: DC

## 2017-01-16 MED ORDER — WARFARIN SODIUM 3 MG PO TABS
1.5000 mg | ORAL_TABLET | Freq: Once | ORAL | Status: AC
  Administered 2017-01-16: 1.5 mg via ORAL
  Filled 2017-01-16: qty 0.5

## 2017-01-16 MED ORDER — LEVOFLOXACIN 500 MG PO TABS
500.0000 mg | ORAL_TABLET | ORAL | Status: DC
  Administered 2017-01-16 – 2017-01-20 (×3): 500 mg via ORAL
  Filled 2017-01-16 (×4): qty 1

## 2017-01-16 NOTE — Progress Notes (Signed)
Pt is taking tylenol throughout the clock for a complain of back pain, Zofran prn given once for nausea, eating and drinking moderate, pure wick catheter working fine, appetite moderate, no any other complain of SOB and CP, will continue to monitor

## 2017-01-16 NOTE — Progress Notes (Signed)
Patient called RN repeatedly in room per being not able to breathe. Oxygen sat is 94% @3l /min, Pt keep complaining of not being able to walk, breathing good and not feeling easy whole day, daughter is in bed side and is updating.

## 2017-01-16 NOTE — Progress Notes (Signed)
PROGRESS NOTE   Kingston Guiles  JQZ:[id number]    DOB: [birth year]    DOA: 01/10/2017  PCP: Tammi Sou, MD   I have briefly reviewed patients previous medical records in [facility].  Brief Narrative:   26 cf chronic combined diastolic and systolic CHF--Last EF 10/2261 ~ 50-55%, stage IV chronic kidney disease with atretic L kidney [no blood flow by RAS-Dr. Gwenlyn Found 10.2017]--baseline creat 2-3 CAD PAF/sick sinus s/p PPM on warfarin CHad2Vasc2 score=6,  home oxygen 3-4 L/m, HLD,  HTN,  Occular CVA 9.14.17,   presented to the ED on 01/11/17 for evaluation of bleeding head wound and right back pain after a fall.  Has been admitted ~ 3-4 times in the past 2 months with AECHF and other issues She had been seen in the ED a week ago after falling and striking her head at which time the laceration was closed with suture but patient apparently started picking at it and then had difficulty controlling bleeding.   She also sustained a mechanical ground-level fall at home and reported right sided back pain worse with chest wall movements or deep inspiration.   In the ED, hypoxic at 88% on her supplemental oxygen, tachypneic, mildly hypertensive, chest x-ray showed nondisplaced right ninth rib fracture without complicating features as well as multiple old right rib fractures with healing.   he was briefly placed on nonrebreather mask. Admitted for acute on chronic hypoxic respiratory failure secondary to acute on chronic systolic and diastolic CHF and chest wall splinting related to pain from rib fracture.   Assessment & Plan:   Principal Problem:   Acute on chronic respiratory failure with hypoxia (HCC) Active Problems:   PAF (paroxysmal atrial fibrillation) (HCC)   Essential hypertension   CKD (chronic kidney disease), stage IV (HCC)   Acute combined systolic and diastolic heart failure (HCC) - possibly related to A. fib   Fracture of one rib, right side, initial encounter for  closed fracture   Acute hypoxic resp failure--worsened on 8/31 Acute decompnesated hf-both sys/diastolic--Last EF 33-54%--TGYB like 50-55% per Dr. Gwenlyn Found note in the past RIb fractures causing splinting--Possible Atelectasis of PNa on CXR 8.29  Last d/c 8.2 on 160 bid lasix--on admit lasix 60 iv bid--8/28-->>>40 IV bid. Transiently on oral lasix  Decompensated 8/31 am--placed back on lasix IV 60 bid    d/c IV morphine 8/29 2/2 sleepiness, d/c percocet 8/31   Flector -helping with rib pain   stressed importance of IS--up with PT when able   Tylenol high dose 1000 qhs--adding roxanol 8/31  q 3 prn for pain and WOB/Anxiety  desat screen to home oxygen levels as tol  I/o - 1.0 liters, standing 116   ? Aspiration PNa vs HCAP 8.29 cxr shows ? Increased R Retrocardiac opacity--will start Cefepime /vanc for Rx--> PO levaquin q 48 9/1  speech feel ok for reg diet with compensation strategies  2 vw CXR 9/1 shows clearing of fluid from lungs  WBC 12--->8  P afib, CHAD=5 Prior CAD h/o Knot on head from fall is stable --no bleeding  Cont Amio 200 bid, coreg 25 bid, Imdur 60 qd, Amlodipine 5 qd  Is in NSR at bedside  INR therapeutic-3.19 today--PharmD to dose  Htn See above discussion Uncontrolled 2/2 to pain  Stg IV CKD Anemia likely renal disease  Ferreheme x 1 8/28  Monitor kidney fucntion--stable as above with lasix IV  sATURATION RATIOS ADEQUATE--DOENST NEED epo RIGHT NOW  Prior Occular CVa and SAH 2/2 to  fall  Has HH  Needs re-eval by PT  High dose Pravachol 80 [change from 40 PTA}  On coumadin--Unclear if good idea to start antiplt with falls + bleed etc.    DVT prophylaxis: Coumadin per pharmacy Code Status: Full Family Communication: no family today Disposition: if stays stable d/c home in 48-72 h   Consultants:  None   Procedures:  None  Antimicrobials:  None    Subjective:  Calm oriented and in nad No fever no chills no n /v Eating  some  Objective:  Vitals:   01/15/17 1818 01/15/17 2100 01/16/17 0441 01/16/17 0800  BP:  (!) 143/53 (!) 162/55 140/80  Pulse:  63 63 66  Resp:  18 18   Temp:  98.5 F (36.9 C) 97.7 F (36.5 C)   TempSrc:  Oral Oral   SpO2:  95% 95%   Weight: 52 kg (114 lb 9.6 oz)  52.3 kg (115 lb 6.4 oz)   Height:        Examination:   eomi frail-hematoma R forehead  s1 s2 rrr, nsr, 1/6 hsm Mild jvd, mild le edema Lung sounds clearer abd distended tympanic-no shift dull No le edema Neuro intact    Data Reviewed: I have personally reviewed following labs and imaging studies  CBC:  Recent Labs Lab 01/10/17 2203 01/13/17 0524 01/14/17 0439 01/15/17 0337  WBC 11.1* 11.2* 12.5* 8.5  NEUTROABS 8.6* 8.8*  --  6.7  HGB 10.0* 8.3* 8.4* 8.4*  HCT 31.3* 27.4* 27.2* 27.8*  MCV 95.4 95.8 96.5 95.5  PLT 217 209 228 782   Basic Metabolic Panel:  Recent Labs Lab 01/12/17 0613 01/13/17 0524 01/14/17 0439 01/15/17 0337 01/16/17 0632  NA 137 135 137 137 137  K 4.0 4.2 4.5 4.0 4.2  CL 92* 93* 96* 96* 94*  CO2 33* 32 33* 32 30  GLUCOSE 105* 107* 128* 114* 106*  BUN 55* 53* 56* 56* 56*  CREATININE 2.29* 2.27* 2.28* 2.23* 2.33*  CALCIUM 9.3 9.2 9.7 9.5 9.6  PHOS  --   --  4.0 3.1  --    Liver Function Tests:  Recent Labs Lab 01/10/17 2203 01/13/17 0524 01/14/17 0439 01/15/17 0337  AST 25 20  --   --   ALT 17 14  --   --   ALKPHOS 59 51  --   --   BILITOT 0.5 0.7  --   --   PROT 7.0 6.4*  --   --   ALBUMIN 3.6 3.2* 3.2* 3.1*   Coagulation Profile:  Recent Labs Lab 01/12/17 9562 01/13/17 0524 01/14/17 0439 01/15/17 0337 01/16/17 0632  INR 2.44 3.45 3.86 3.35 3.19   Cardiac Enzymes: No results for input(s): CKTOTAL, CKMB, CKMBINDEX, TROPONINI in the last 168 hours. HbA1C: No results for input(s): HGBA1C in the last 72 hours. CBG: No results for input(s): GLUCAP in the last 168 hours.  Recent Results (from the past 240 hour(s))  Culture, blood (routine x 2)  Call MD if unable to obtain prior to antibiotics being given     Status: None (Preliminary result)   Collection Time: 01/13/17 12:55 PM  Result Value Ref Range Status   Specimen Description BLOOD LEFT HAND  Final   Special Requests IN PEDIATRIC BOTTLE Blood Culture adequate volume  Final   Culture NO GROWTH 3 DAYS  Final   Report Status PENDING  Incomplete  Culture, blood (routine x 2) Call MD if unable to obtain prior to antibiotics being given  Status: None (Preliminary result)   Collection Time: 01/13/17 12:56 PM  Result Value Ref Range Status   Specimen Description BLOOD LEFT FOREARM  Final   Special Requests IN PEDIATRIC BOTTLE Blood Culture adequate volume  Final   Culture NO GROWTH 3 DAYS  Final   Report Status PENDING  Incomplete         Radiology Studies: Dg Chest 2 View  Result Date: 01/16/2017 CLINICAL DATA:  Chronic 7+ month history of shortness of breath. Follow-up CHF and pleural effusions. Current history of COPD, hypertension and CHF. EXAM: CHEST  2 VIEW COMPARISON:  01/15/2017, 01/13/2017 and dating back to 01/28/2016. FINDINGS: AP semi-erect and lateral images were obtained. Cardiac silhouette markedly enlarged, unchanged. Left subclavian dual lead transvenous pacemaker unchanged and intact. Thoracic aorta atherosclerotic, unchanged. Hilar and mediastinal contours otherwise unremarkable. Interval resolution of interstitial pulmonary edema since yesterday. Stable bilateral pleural effusions, left greater than right and associated consolidation in the left lower lobe. No new pulmonary parenchymal abnormalities. Prior augmentation of a mid thoracic vertebral body. IMPRESSION: 1. Resolution of interstitial pulmonary edema since yesterday. 2. Stable bilateral pleural effusions, left greater than right, with associated passive atelectasis in the left lower lobe. 3. No new abnormalities. Electronically Signed   By: Evangeline Dakin M.D.   On: 01/16/2017 13:54   Dg Chest Port 1  View  Result Date: 01/15/2017 CLINICAL DATA:  Hypoxia. EXAM: PORTABLE CHEST 1 VIEW COMPARISON:  01/13/2017 FINDINGS: Left chest wall pacer device is noted with lead in the right atrial appendage and right ventricle. There is cardiac enlargement and aortic atherosclerosis. Bilateral pleural effusions are noted left greater than right. Mild to moderate pulmonary edema. IMPRESSION: 1. Mild to moderate congestive heart failure. 2.  Aortic Atherosclerosis (ICD10-I70.0). Electronically Signed   By: Kerby Moors M.D.   On: 01/15/2017 08:19    Scheduled Meds: . acetaminophen  1,000 mg Oral QHS  . amiodarone  200 mg Oral BID  . amLODipine  5 mg Oral Daily  . calcium-vitamin D  1 tablet Oral BID  . carvedilol  25 mg Oral BID WC  . diclofenac  1 patch Transdermal BID  . furosemide  60 mg Intravenous Q12H  . hydrALAZINE  100 mg Oral TID  . isosorbide mononitrate  60 mg Oral Daily  . levofloxacin  500 mg Oral Q48H  . multivitamin with minerals  1 tablet Oral Once per day on Mon Wed Fri  . polyethylene glycol  17 g Oral Daily  . pravastatin  80 mg Oral q1800  . sodium chloride flush  3 mL Intravenous Q12H  . vitamin C  500 mg Oral Daily  . warfarin  1.5 mg Oral ONCE-1800  . Warfarin - Pharmacist Dosing Inpatient   Does not apply q1800   Continuous Infusions: . sodium chloride    . ferumoxytol Stopped (01/12/17 1015)     LOS: 5 days  Verneita Griffes, MD [facility] [phone]

## 2017-01-16 NOTE — Progress Notes (Signed)
ANTICOAGULATION CONSULT NOTE - Follow Up Consult  Pharmacy Consult for Coumadin Indication: atrial fibrillation  Allergies  Allergen Reactions  . Clonidine Derivatives Palpitations and Other (See Comments)    Very sedated  . Codeine Nausea Only  . Nickel Rash  . Sulfa Antibiotics Other (See Comments)    Reaction unknown    Patient Measurements: Height: 4\' 11"  (149.9 cm) Weight: 115 lb 6.4 oz (52.3 kg) IBW/kg (Calculated) : 43.2  Vital Signs: Temp: 97.7 F (36.5 C) (09/01 0441) Temp Source: Oral (09/01 0441) BP: 162/55 (09/01 0441) Pulse Rate: 63 (09/01 0441)  Labs:  Recent Labs  01/14/17 0439 01/15/17 0337 01/16/17 0632  HGB 8.4* 8.4*  --   HCT 27.2* 27.8*  --   PLT 228 235  --   LABPROT 37.6* 33.7* 32.4*  INR 3.86 3.35 3.19  CREATININE 2.28* 2.23* 2.33*    Estimated Creatinine Clearance: 14 mL/min (A) (by C-G formula based on SCr of 2.33 mg/dL (H)).  Assessment:  83 yr old female on Coumadin for afib.  INR remains supratherapeutic at 3.19. Last dose of coumadin was 8/28 and has been held since for INR > 3. Anticipate continued INR trend down.     Home Coumadin regimen: 3 mg TTSS, 1.5 mg MWF   Goal of Therapy:  INR 2-3 Monitor platelets by anticoagulation protocol: Yes   Plan:  Coumadin 1.5mg  po today Daily PT/INR.   Hildred Laser, Pharm D 01/16/2017 11:24 AM

## 2017-01-17 DIAGNOSIS — F411 Generalized anxiety disorder: Secondary | ICD-10-CM

## 2017-01-17 DIAGNOSIS — F068 Other specified mental disorders due to known physiological condition: Secondary | ICD-10-CM

## 2017-01-17 DIAGNOSIS — G4700 Insomnia, unspecified: Secondary | ICD-10-CM

## 2017-01-17 DIAGNOSIS — R0602 Shortness of breath: Secondary | ICD-10-CM

## 2017-01-17 LAB — COMPREHENSIVE METABOLIC PANEL
ALBUMIN: 3.1 g/dL — AB (ref 3.5–5.0)
ALK PHOS: 52 U/L (ref 38–126)
ALT: 18 U/L (ref 14–54)
ANION GAP: 12 (ref 5–15)
AST: 23 U/L (ref 15–41)
BILIRUBIN TOTAL: 0.4 mg/dL (ref 0.3–1.2)
BUN: 58 mg/dL — AB (ref 6–20)
CO2: 29 mmol/L (ref 22–32)
Calcium: 9.4 mg/dL (ref 8.9–10.3)
Chloride: 91 mmol/L — ABNORMAL LOW (ref 101–111)
Creatinine, Ser: 2.47 mg/dL — ABNORMAL HIGH (ref 0.44–1.00)
GFR calc Af Amer: 20 mL/min — ABNORMAL LOW (ref >60)
GFR calc non Af Amer: 17 mL/min — ABNORMAL LOW (ref >60)
GLUCOSE: 106 mg/dL — AB (ref 65–99)
POTASSIUM: 4.2 mmol/L (ref 3.5–5.1)
SODIUM: 132 mmol/L — AB (ref 135–145)
Total Protein: 6.4 g/dL — ABNORMAL LOW (ref 6.5–8.1)

## 2017-01-17 LAB — CBC WITH DIFFERENTIAL/PLATELET
Basophils Absolute: 0 10*3/uL (ref 0.0–0.1)
Basophils Relative: 0 %
EOS PCT: 1 %
Eosinophils Absolute: 0.1 10*3/uL (ref 0.0–0.7)
HCT: 27.1 % — ABNORMAL LOW (ref 36.0–46.0)
Hemoglobin: 8.3 g/dL — ABNORMAL LOW (ref 12.0–15.0)
LYMPHS PCT: 10 %
Lymphs Abs: 1.2 10*3/uL (ref 0.7–4.0)
MCH: 29.1 pg (ref 26.0–34.0)
MCHC: 30.6 g/dL (ref 30.0–36.0)
MCV: 95.1 fL (ref 78.0–100.0)
MONO ABS: 0.7 10*3/uL (ref 0.1–1.0)
MONOS PCT: 6 %
NEUTROS ABS: 9.8 10*3/uL — AB (ref 1.7–7.7)
Neutrophils Relative %: 83 %
PLATELETS: 251 10*3/uL (ref 150–400)
RBC: 2.85 MIL/uL — ABNORMAL LOW (ref 3.87–5.11)
RDW: 15.6 % — AB (ref 11.5–15.5)
WBC: 11.8 10*3/uL — ABNORMAL HIGH (ref 4.0–10.5)

## 2017-01-17 LAB — PROTIME-INR
INR: 2.64
PROTHROMBIN TIME: 28 s — AB (ref 11.4–15.2)

## 2017-01-17 MED ORDER — ESCITALOPRAM OXALATE 10 MG PO TABS
5.0000 mg | ORAL_TABLET | Freq: Every day | ORAL | Status: DC
  Administered 2017-01-18 – 2017-01-21 (×5): 5 mg via ORAL
  Filled 2017-01-17 (×8): qty 1

## 2017-01-17 MED ORDER — MORPHINE SULFATE (CONCENTRATE) 10 MG/0.5ML PO SOLN
5.0000 mg | ORAL | Status: AC | PRN
  Administered 2017-01-17 (×2): 5 mg via ORAL
  Filled 2017-01-17 (×3): qty 0.5

## 2017-01-17 MED ORDER — OXYCODONE-ACETAMINOPHEN 5-325 MG PO TABS
1.0000 | ORAL_TABLET | Freq: Four times a day (QID) | ORAL | Status: DC | PRN
  Administered 2017-01-17: 2 via ORAL
  Filled 2017-01-17: qty 2

## 2017-01-17 MED ORDER — MIRTAZAPINE 15 MG PO TABS
7.5000 mg | ORAL_TABLET | Freq: Every day | ORAL | Status: DC
  Administered 2017-01-18 – 2017-01-21 (×5): 7.5 mg via ORAL
  Filled 2017-01-17 (×8): qty 1

## 2017-01-17 MED ORDER — WARFARIN SODIUM 3 MG PO TABS
3.0000 mg | ORAL_TABLET | Freq: Once | ORAL | Status: AC
  Administered 2017-01-17: 3 mg via ORAL
  Filled 2017-01-17: qty 1

## 2017-01-17 NOTE — Progress Notes (Signed)
Patient attempting to have BM, fleets enema administered with no results.  RN felt no stool in rectum.  Warm prune juice given as well to encourage bowels.

## 2017-01-17 NOTE — Progress Notes (Signed)
PROGRESS NOTE   Suzann Lazaro  RXV:[id number]    DOB: 11-Dec-1932    DOA: 01/10/2017  PCP: Tammi Sou, MD   I have briefly reviewed patients previous medical records in [facility].  Brief Narrative:   45 cf chronic combined diastolic and systolic CHF--Last EF 09/930 ~ 50-55%, stage IV chronic kidney disease with atretic L kidney [no blood flow by RAS-Dr. Gwenlyn Found 10.2017]--baseline creat 2-3 CAD PAF/sick sinus s/p PPM on warfarin CHad2Vasc2 score=6,  home oxygen 3-4 L/m, HLD,  HTN,  Occular CVA 9.14.17  presented to the ED on 01/11/17 for evaluation of bleeding head wound and right back pain after a fall.  Has been admitted ~ 3-4 times in the past 2 months with AECHF and other issues She had been seen in the ED a week ago after falling and striking her head at which time the laceration was closed with suture but patient apparently started picking at it and then had difficulty controlling bleeding.   She also sustained a mechanical ground-level fall at home and reported right sided back pain worse with chest wall movements or deep inspiration.   In the ED, hypoxic at 88% on her supplemental oxygen, tachypneic, mildly hypertensive, chest x-ray showed nondisplaced right ninth rib fracture without complicating features as well as multiple old right rib fractures with healing.   he was briefly placed on nonrebreather mask. Admitted for acute on chronic hypoxic respiratory failure secondary to acute on chronic systolic and diastolic CHF and chest wall splinting related to pain from rib fracture.   Assessment & Plan:   Principal Problem:   Acute on chronic respiratory failure with hypoxia (HCC) Active Problems:   PAF (paroxysmal atrial fibrillation) (HCC)   Essential hypertension   CKD (chronic kidney disease), stage IV (HCC)   Acute combined systolic and diastolic heart failure (HCC) - possibly related to A. fib   Fracture of one rib, right side, initial encounter for closed  fracture   Acute hypoxic resp failure--worsened on 8/31 Acute decompnesated hf-both sys/diastolic--Last EF 67-12%--WPYK like 50-55% per Dr. Gwenlyn Found note in the past RIb fractures causing splinting--Possible Atelectasis of PNa on CXR 8.29  Last d/c 8.2 on 160 bid lasix--on admit lasix 60 iv bid--8/28-->>>40 IV bid. Transiently on oral lasix  Decompensated 8/31 am--placed back on lasix IV 60 bid and will need transition to PO lasix 60 po bid am 9/3    d/c IV morphine 8/29 2/2 sleepiness, d/c percocet 8/31   Flector -helping with rib pain   stressed importance of IS--RE-EMPHASIZED NEEDS TO BE OOB   Tylenol high dose 1000 qhs--adding roxanol 8/31  q 3 prn for pain and WOB/Anxiety  desat screen to home oxygen levels as tol  I/o are not accurate today 9/2, standing wght 116-->107   Psychiatry issues affecting care  Long talk at bedside 9/2 with patient  She has this unfounded fear of dying and being in Hospice [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 Aspiration PNa vs HCAP 8.29 cxr shows ? Increased R Retrocardiac opacity--will start Cefepime /vanc for Rx--> PO levaquin q 48 9/1  speech feel ok for reg diet with compensation  strategies  2 vw CXR 9/1 shows clearing of fluid from lungs  WBC 12--->8  P afib, CHAD=5 Prior CAD h/o Knot on head from fall is stable --no bleeding  Cont Amio 200 bid, coreg 25 bid, Imdur 60 qd, Amlodipine 5 qd  Is in NSR at bedside  INR therapeutic-  2.6 today--PharmD to dose  Htn See above discussion Uncontrolled 2/2 to pain  Stg IV CKD Anemia likely renal disease  Ferreheme x 1 8/28  Monitor kidney fucntion--stable as above  with lasix IV  No need EPo or transfusion  Prior Occular CVa and SAH 2/2 to fall  Has HH  Needs re-eval by PT  High dose Pravachol 80 recommended--patient only wants 40 mg   On coumadin--Unclear if good idea to start antiplt with falls + bleed etc.    DVT prophylaxis: Coumadin per pharmacy Code Status: Full Family Communication: 20 min discussion with family Disposition: strongly am recommending SNF Will ask chaplain to see for support and life transitions  Psych to see Not ready for home SNf when available   Consultants:  None   Procedures:  None  Antimicrobials:  None    Subjective:  Bad night--looks like Roxanol fell off MAR--I apolofgized to patient and assured we would make sure she has some roxanl on board No weakness eating and drinking some now Not SOB, pain is 2-3/10   Objective:  Vitals:   01/16/17 0800 01/16/17 1627 01/16/17 2106 01/17/17 0524  BP: 140/80 (!) 160/59 (!) 135/55 (!) 151/76  Pulse: 66 63 (!) 59 72  Resp:  18 18 15   Temp:  98.3 F (36.8 C) 98.1 F (36.7 C) 97.6 F (36.4 C)  TempSrc:  Oral Oral Oral  SpO2:  95% 98% 95%  Weight:    48.5 kg (107 lb)  Height:        Examination:   eomi frail-hematoma R forehead  s1 s2 rrr, nsr, 1/6 hsm Mild jvd, mild le edema Lung sounds clearer abd distended tympanic-no shift dull No le edema Neuro intact    Data Reviewed: I have personally reviewed following labs and imaging studies  CBC:  Recent Labs Lab 01/10/17 2203 01/13/17 0524 01/14/17 0439 01/15/17 0337 01/17/17 0801  WBC 11.1* 11.2* 12.5* 8.5 11.8*  NEUTROABS 8.6* 8.8*  --  6.7 9.8*  HGB 10.0* 8.3* 8.4* 8.4* 8.3*  HCT 31.3* 27.4* 27.2* 27.8* 27.1*  MCV 95.4 95.8 96.5 95.5 95.1  PLT 217 209 228 235 062   Basic Metabolic Panel:  Recent Labs Lab 01/13/17 0524 01/14/17 0439 01/15/17 0337 01/16/17 0632 01/17/17 0759  NA 135 137 137 137 132*  K 4.2 4.5 4.0 4.2 4.2  CL 93* 96* 96* 94* 91*  CO2 32 33* 32 30 29    GLUCOSE 107* 128* 114* 106* 106*  BUN 53* 56* 56* 56* 58*  CREATININE 2.27* 2.28* 2.23* 2.33* 2.47*  CALCIUM 9.2 9.7 9.5 9.6 9.4  PHOS  --  4.0 3.1  --   --    Liver Function Tests:  Recent Labs Lab 01/10/17 2203 01/13/17 0524 01/14/17 0439 01/15/17 0337 01/17/17 0759  AST 25 20  --   --  23  ALT 17 14  --   --  18  ALKPHOS 59 51  --   --  52  BILITOT 0.5 0.7  --   --  0.4  PROT 7.0 6.4*  --   --  6.4*  ALBUMIN 3.6 3.2* 3.2* 3.1* 3.1*   Coagulation Profile:  Recent Labs Lab 01/14/17 0439 01/15/17 0337 01/16/17 0632 01/17/17 0558 01/17/17 0759  INR 3.86 3.35 3.19 SPECIMEN CLOTTED 2.64   Cardiac Enzymes: No results for input(s): CKTOTAL, CKMB, CKMBINDEX, TROPONINI in the last 168 hours. HbA1C: No results for input(s): HGBA1C in the last 72 hours. CBG: No results for input(s): GLUCAP in the last 168 hours.  Recent Results (from the past 240 hour(s))  Culture, blood (routine x 2) Call MD if unable to obtain prior to antibiotics being given     Status: None (Preliminary result)   Collection Time: 01/13/17 12:55 PM  Result Value Ref Range Status   Specimen Description BLOOD LEFT HAND  Final   Special Requests IN PEDIATRIC BOTTLE Blood Culture adequate volume  Final   Culture NO GROWTH 3 DAYS  Final   Report Status PENDING  Incomplete  Culture, blood (routine x 2) Call MD if unable to obtain prior to antibiotics being given     Status: None (Preliminary result)   Collection Time: 01/13/17 12:56 PM  Result Value Ref Range Status   Specimen Description BLOOD LEFT FOREARM  Final   Special Requests IN PEDIATRIC BOTTLE Blood Culture adequate volume  Final   Culture NO GROWTH 3 DAYS  Final   Report Status PENDING  Incomplete      Radiology Studies: Dg Chest 2 View  Result Date: 01/16/2017 CLINICAL DATA:  Chronic 7+ month history of shortness of breath. Follow-up CHF and pleural effusions. Current history of COPD, hypertension and CHF. EXAM: CHEST  2 VIEW COMPARISON:   01/15/2017, 01/13/2017 and dating back to 01/28/2016. FINDINGS: AP semi-erect and lateral images were obtained. Cardiac silhouette markedly enlarged, unchanged. Left subclavian dual lead transvenous pacemaker unchanged and intact. Thoracic aorta atherosclerotic, unchanged. Hilar and mediastinal contours otherwise unremarkable. Interval resolution of interstitial pulmonary edema since yesterday. Stable bilateral pleural effusions, left greater than right and associated consolidation in the left lower lobe. No new pulmonary parenchymal abnormalities. Prior augmentation of a mid thoracic vertebral body. IMPRESSION: 1. Resolution of interstitial pulmonary edema since yesterday. 2. Stable bilateral pleural effusions, left greater than right, with associated passive atelectasis in the left lower lobe. 3. No new abnormalities. Electronically Signed   By: Evangeline Dakin M.D.   On: 01/16/2017 13:54    Scheduled Meds: . acetaminophen  1,000 mg Oral QHS  . amiodarone  200 mg Oral BID  . amLODipine  5 mg Oral Daily  . calcium-vitamin D  1 tablet Oral BID  . carvedilol  25 mg Oral BID WC  . diclofenac  1 patch Transdermal BID  . furosemide  60 mg Intravenous Q12H  . hydrALAZINE  100 mg Oral TID  . isosorbide mononitrate  60 mg Oral Daily  . levofloxacin  500 mg Oral Q48H  . multivitamin with minerals  1 tablet Oral Once per day on Mon Wed Fri  . polyethylene glycol  17 g Oral Daily  . pravastatin  80 mg Oral q1800  . sodium chloride flush  3 mL Intravenous Q12H  . vitamin C  500 mg Oral Daily  . Warfarin - Pharmacist Dosing Inpatient   Does not apply q1800   Continuous Infusions: . sodium chloride    . ferumoxytol Stopped (01/12/17 1015)     LOS: 6 days  Verneita Griffes, MD [facility] [phone]

## 2017-01-17 NOTE — Progress Notes (Signed)
Patient has been very calm this shift, has been medicated with Morphine x 2.  Got up to sink and washed herself with minimal assistance, one assist and walker to stand and walk in room.  Daughter in and out of room this shift.

## 2017-01-17 NOTE — Progress Notes (Signed)
Pt is up in the chair with complains of feeling bowel obstructed, last BM reported aug 26. Hyperactive bowel sound in all 4 quadrants. Lowered legs to help bowels move plan to walk when ready.

## 2017-01-17 NOTE — Progress Notes (Signed)
Per request from Physician I met with the patient.  We had a 30 minute conversation.  She seemed relatively calm but shared about her night and her anxiety and fear.  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  In talking about being here and what is next she is worried about what will happen to her, but seemed to be a place of sone peace when I left, we had a long prayer together.   Told her if she needs anything from a chaplain to please have the nurse page [place]. Chaplain Katherene Ponto

## 2017-01-17 NOTE — Consult Note (Signed)
[facility] Face-to-Face Psychiatry Consult   Reason for Consult:  Anxiety Referring Physician:  Dr. Verlon Au Patient Identification: Normagene Harvie MRN:  [id number] Principal Diagnosis: Acute on chronic respiratory failure with hypoxia [facility]) Diagnosis:   Patient Active Problem List   Diagnosis Date Noted  . Fracture of one rib, right side, initial encounter for closed fracture [S22.31XA] 01/11/2017  . Acute on chronic systolic congestive heart failure ([place]) [I50.23]   . Closed fracture of one rib of right side [S22.31XA]   . Chronic combined systolic and diastolic heart failure ([place]) [I50.42] 01/05/2017  . Palpitations [R00.2] 12/15/2016  . Hypokalemia [E87.6] 12/14/2016  . Leukocytosis [D72.829] 12/14/2016  . Atrial fibrillation ([place]) [I48.91] [I48.91] 12/11/2016  . Long term (current) use of anticoagulants [Z79.01] [Z79.01] 12/11/2016  . Tachycardia-bradycardia syndrome ([place]) - s/p PPM [I49.5] 12/07/2016  . Nonischemic cardiomyopathy ([place]): EF 35-40% [I42.8] 12/07/2016  . Acute combined systolic and diastolic heart failure ([place]) - possibly related to A. fib [I50.41] 12/07/2016  . Cardiac pacemaker [Z95.0]   . Acute on chronic respiratory failure with hypoxia (HCC) [J96.21]   . Acute pulmonary edema with congestive heart failure ([place]) [I50.1] 11/30/2016  . Elevated troponin level [R74.8] 11/30/2016  . Anemia [D64.9] 11/30/2016  . Chronic midline thoracic back pain [M54.6, G89.29] 09/15/2016  . Acute CVA (cerebrovascular accident) ([place]) [I63.9] 09/15/2016  . Hyperlipidemia [E78.5]   . Nausea & vomiting [R11.2] 08/05/2016  . Dehydration [E86.0] 08/05/2016  . AKI (acute kidney injury) ([place]) [N17.9] 08/05/2016  . Osteoporosis [M81.0] 07/28/2016  . Closed compression fracture of thoracic vertebra ([place]) [S22.000A] 07/23/2016  . Thoracic compression fracture ([place]) [S22.000A] 07/22/2016  . Acute renal failure superimposed on chronic kidney disease ([place]) [N17.9, N18.9] 07/11/2016  . Flank pain  [R10.9] 07/11/2016  . Hypoalbuminemia [E88.09] 07/11/2016  . Renal artery stenosis ([place]) [I70.1] 06/24/2016  . Nondisplaced fracture of fifth metatarsal bone, left foot, initial encounter for closed fracture [S92.355A] 06/10/2016  . Rib contusion, left, initial encounter [S20.212A] 06/10/2016  . Single kidney [Z90.5] 03/13/2016  . Chest pain [R07.9]   . Dyslipidemia [E78.5]   . PAF (paroxysmal atrial fibrillation) ([place]) [I48.0] 01/28/2016  . Essential hypertension [I10] 01/28/2016  . Cardioembolic stroke ([place]) [E56.3] 01/28/2016  . Retinal artery branch occlusion of left eye [H34.232] 01/28/2016  . Personal history of subarachnoid hemorrhage [Z86.79] 01/28/2016  . CKD (chronic kidney disease), stage IV ([place]) [N18.4] 01/28/2016    Total Time spent with patient: 1 hour  Subjective:   Valerie Terrell is a 83 y.o. female patient admitted with recent fall.  HPI:  Valerie Terrell is a 83 y.o. female, seen, chart reviewed and case discussed with the patient, patient's sister who came to the bedside and hospitalists for this face-to-face psychiatric consultation evaluation of ongoing anxiety, occasional shortness of breath, disturbed sleep and appetite. Patient reportedly become panic and started banging with the TV remote and the table with the frustration and anger when she could not get the attention of the people after midnight. Patient has multiple medical problems including systolic congestive heart failure, chronic kidney disease and paroxysmal atrial fibrillation. Patient reportedly has no previous psychiatric medication management. Patient has no suicidal or homicidal ideation, intention or plans. Patient has no evidence of auditory/visual hallucinations, delusional paranoia.   Past Psychiatric History: Denied  Risk to Self: Is patient at risk for suicide?: No Risk to Others:   Prior Inpatient Therapy:   Prior Outpatient Therapy:    Past Medical History:  Past Medical History:  Diagnosis  Date  .  Atrophy of left kidney    with absent blood flow by renal artery dopplers (Dr. Gwenlyn Found)  . 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 Chronic combined systolic and diastolic CHF (congestive heart failure) ([place])    a. 11/2016: echo showing EF of 35-40%, RV strain noted, mild MR and mild TR.   [name] Chronic renal insufficiency, stage 4 (severe) (HCC)    Baseline GFR 25 ml/min-- renal u/s showed atrophic/hypoplastic left kidney.  No hydronephrosis.  Small right renal cyst.  . History of adenomatous polyp of colon   . History of subarachnoid hemorrhage 10/2014   after syncope and while on xarelto  . Hyperlipidemia   . Hypertension    Difficult to control, in the setting of one functioning kidney: pt was referred to nephrology by Dr. Gwenlyn Found 06/2015.  . Lumbar radiculopathy 2012  . Lumbar spondylosis    MR 07/2016---no sign of spinal nerve compression or cord compression.  Pt set up with outpt ortho while admitted to hosp 07/2016.  . Metatarsal fracture 06/10/2016   Nondisplaced, left 5th metatarsal--pt was referred to ortho  . Nonischemic cardiomyopathy ([place])   . Osteopenia 2014   T-score -2.1  . PAF (paroxysmal atrial fibrillation) (HCC)    Eliquis started after BRAO and CVA.  She was changed to warfarin 2018.  . Pulmonary edema 11/2016  . Stroke [facility])    cardioembolic (had CVA while on no anticoag)--"scattered subacute punctate infarcts: 1 in R parietal lobe and 2 in occipital cortex" on MRI br.  CT angio head/neck: aortic arch athero.   . Thoracic compression fracture ([place]) 07/2016   T7 and T8-- T8 kyphoplasty during hosp admission 07/2016.  Neuro referred pt to pain mgmt for consideration of injection 09/2016.  I referred her to endo 08/2016 for consideration of calcitonin treatment.    Past Surgical History:  Procedure Laterality Date  . APPENDECTOMY  child  . CARDIOVASCULAR STRESS TEST  01/31/2016   Stress myoview: NORMAL/Low risk.  EF 56%.  . [place]  .  COLONOSCOPY  2015   + hx of adenomatous polyps.  Need digest health spec in [place] records to see when pt due for next colonoscopy  . IR GENERIC HISTORICAL  07/24/2016   IR KYPHO THORACIC WITH BONE BIOPSY 07/24/2016 Luanne Bras, MD [facility]  . KYPHOPLASTY  07/27/2016   T8  . PACEMAKER IMPLANT N/A 12/03/2016   Procedure: Pacemaker Implant;  Surgeon: Evans Lance, MD;  Location: [facility];  Service: Cardiovascular;  Laterality: N/A;  . Renal artery dopplers  02/26/2016   Her right renal dimension was 11 cm pole to pole with mild to moderate right renal artery stenosis. A right renal aortic ratio was 3.22 suggesting less than a 50% stenosis.  . TONSILLECTOMY    . TRANSTHORACIC ECHOCARDIOGRAM  01/29/2016; 11/2016   2017: EF 55-60%, normal LV wall motion, grade I DD.  No cardiac source of emboli was seen.  2018: EF 35-40%, could not assess DD due to a-fib, pulm HTN noted.   Family History:  Family History  Problem Relation Age of Onset  . Cancer Mother   . Heart disease Father   . Early death Father   . Sudden Cardiac Death Neg Hx    Family Psychiatric  History: Denied Social History:  History  Alcohol Use  . 0.6 oz/week  . 1 Glasses of wine per week    Comment: wine     History  Drug Use No  Social History   Social History  . Marital status: Widowed    Spouse name: N/A  . Number of children: N/A  . Years of education: N/A   Social History Main Topics  . Smoking status: Never Smoker  . Smokeless tobacco: Never Used  . Alcohol use 0.6 oz/week    1 Glasses of wine per week     Comment: wine  . Drug use: No  . Sexual activity: No   Other Topics Concern  . None   Social History Narrative   Widow.  One daughter, lives with her.   Educ: college   Occup: retired Marine scientist.   No T/A/Ds.   She is almost a vegetarian.   Additional Social History:    Allergies:   Allergies  Allergen Reactions  . Clonidine Derivatives Palpitations and Other (See  Comments)    Very sedated  . Codeine Nausea Only  . Nickel Rash  . Sulfa Antibiotics Other (See Comments)    Reaction unknown    Labs:  Results for orders placed or performed during the hospital encounter of 01/10/17 (from the past 48 hour(s))  Basic metabolic panel     Status: Abnormal   Collection Time: 01/16/17  6:32 AM  Result Value Ref Range   Sodium 137 135 - 145 mmol/L   Potassium 4.2 3.5 - 5.1 mmol/L   Chloride 94 (L) 101 - 111 mmol/L   CO2 30 22 - 32 mmol/L   Glucose, Bld 106 (H) 65 - 99 mg/dL   BUN 56 (H) 6 - 20 mg/dL   Creatinine, Ser 2.33 (H) 0.44 - 1.00 mg/dL   Calcium 9.6 8.9 - 10.3 mg/dL   GFR calc non Af Amer 18 (L) >60 mL/min   GFR calc Af Amer 21 (L) >60 mL/min    Comment: (NOTE) The eGFR has been calculated using the CKD EPI equation. This calculation has not been validated in all clinical situations. eGFR's persistently <60 mL/min signify possible Chronic Kidney Disease.    Anion gap 13 5 - 15  Protime-INR     Status: Abnormal   Collection Time: 01/16/17  6:32 AM  Result Value Ref Range   Prothrombin Time 32.4 (H) 11.4 - 15.2 seconds   INR 3.19   Protime-INR     Status: None   Collection Time: 01/17/17  5:58 AM  Result Value Ref Range   Prothrombin Time SPECIMEN CLOTTED 11.4 - 15.2 seconds    Comment: LEE YARBER RN AT 1610  CORRECTED ON 09/02 AT 0652: PREVIOUSLY REPORTED AS 26.4    INR SPECIMEN CLOTTED     Comment: CORRECTED ON 09/02 AT 9604: PREVIOUSLY REPORTED AS 2.45  Protime-INR     Status: Abnormal   Collection Time: 01/17/17  7:59 AM  Result Value Ref Range   Prothrombin Time 28.0 (H) 11.4 - 15.2 seconds   INR 2.64   Comprehensive metabolic panel     Status: Abnormal   Collection Time: 01/17/17  7:59 AM  Result Value Ref Range   Sodium 132 (L) 135 - 145 mmol/L   Potassium 4.2 3.5 - 5.1 mmol/L   Chloride 91 (L) 101 - 111 mmol/L   CO2 29 22 - 32 mmol/L   Glucose, Bld 106 (H) 65 - 99 mg/dL   BUN 58 (H) 6 - 20 mg/dL   Creatinine, Ser  2.47 (H) 0.44 - 1.00 mg/dL   Calcium 9.4 8.9 - 10.3 mg/dL   Total Protein 6.4 (L) 6.5 - 8.1 g/dL   Albumin  3.1 (L) 3.5 - 5.0 g/dL   AST 23 15 - 41 U/L   ALT 18 14 - 54 U/L   Alkaline Phosphatase 52 38 - 126 U/L   Total Bilirubin 0.4 0.3 - 1.2 mg/dL   GFR calc non Af Amer 17 (L) >60 mL/min   GFR calc Af Amer 20 (L) >60 mL/min    Comment: (NOTE) The eGFR has been calculated using the CKD EPI equation. This calculation has not been validated in all clinical situations. eGFR's persistently <60 mL/min signify possible Chronic Kidney Disease.    Anion gap 12 5 - 15  CBC with Differential/Platelet     Status: Abnormal   Collection Time: 01/17/17  8:01 AM  Result Value Ref Range   WBC 11.8 (H) 4.0 - 10.5 K/uL   RBC 2.85 (L) 3.87 - 5.11 MIL/uL   Hemoglobin 8.3 (L) 12.0 - 15.0 g/dL   HCT 27.1 (L) 36.0 - 46.0 %   MCV 95.1 78.0 - 100.0 fL   MCH 29.1 26.0 - 34.0 pg   MCHC 30.6 30.0 - 36.0 g/dL   RDW 15.6 (H) 11.5 - 15.5 %   Platelets 251 150 - 400 K/uL   Neutrophils Relative % 83 %   Neutro Abs 9.8 (H) 1.7 - 7.7 K/uL   Lymphocytes Relative 10 %   Lymphs Abs 1.2 0.7 - 4.0 K/uL   Monocytes Relative 6 %   Monocytes Absolute 0.7 0.1 - 1.0 K/uL   Eosinophils Relative 1 %   Eosinophils Absolute 0.1 0.0 - 0.7 K/uL   Basophils Relative 0 %   Basophils Absolute 0.0 0.0 - 0.1 K/uL    Current Facility-Administered Medications  Medication Dose Route Frequency Provider Last Rate Last Dose  . 0.9 %  sodium chloride infusion  250 mL Intravenous PRN Opyd, Ilene Qua, MD      . acetaminophen (TYLENOL) tablet 1,000 mg  1,000 mg Oral QHS Nita Sells, MD   1,000 mg at 01/16/17 2335  . acetaminophen (TYLENOL) tablet 650 mg  650 mg Oral Q4H PRN Opyd, Ilene Qua, MD   650 mg at 01/16/17 1318  . albuterol (PROVENTIL) (2.5 MG/3ML) 0.083% nebulizer solution 2.5 mg  2.5 mg Nebulization Q6H PRN Opyd, Ilene Qua, MD   2.5 mg at 01/17/17 0528  . amiodarone (PACERONE) tablet 200 mg  200 mg Oral BID Opyd,  Ilene Qua, MD   200 mg at 01/17/17 1205  . amLODipine (NORVASC) tablet 5 mg  5 mg Oral Daily Opyd, Ilene Qua, MD   5 mg at 01/17/17 1206  . calcium-vitamin D (OSCAL WITH D) 500-200 MG-UNIT per tablet 1 tablet  1 tablet Oral BID Modena Jansky, MD   1 tablet at 01/17/17 1206  . carvedilol (COREG) tablet 25 mg  25 mg Oral BID WC Opyd, Ilene Qua, MD   25 mg at 01/17/17 0735  . diclofenac (FLECTOR) 1.3 % 1 patch  1 patch Transdermal BID Nita Sells, MD   1 patch at 01/17/17 1040  . ferumoxytol (FERAHEME) 510 mg in sodium chloride 0.9 % 100 mL IVPB  510 mg Intravenous Q Dyane Dustman, MD   Stopped at 01/12/17 1015  . furosemide (LASIX) injection 60 mg  60 mg Intravenous Q12H Nita Sells, MD   60 mg at 01/17/17 0734  . hydrALAZINE (APRESOLINE) injection 10 mg  10 mg Intravenous Q4H PRN Vianne Bulls, MD   10 mg at 01/15/17 0743  . hydrALAZINE (APRESOLINE) tablet 100 mg  100  mg Oral TID Vianne Bulls, MD   100 mg at 01/17/17 1205  . isosorbide mononitrate (IMDUR) 24 hr tablet 60 mg  60 mg Oral Daily Opyd, Ilene Qua, MD   60 mg at 01/17/17 1206  . levofloxacin (LEVAQUIN) tablet 500 mg  500 mg Oral Q48H Nita Sells, MD   500 mg at 01/16/17 1610  . morphine CONCENTRATE 10 MG/0.5ML oral solution 5 mg  5 mg Oral Q3H PRN Nita Sells, MD   5 mg at 01/17/17 0940  . multivitamin with minerals tablet 1 tablet  1 tablet Oral Once per day on Mon Wed Fri Opyd, Timothy S, MD   1 tablet at 01/13/17 0900  . ondansetron (ZOFRAN) injection 4 mg  4 mg Intravenous Q6H PRN Opyd, Ilene Qua, MD   4 mg at 01/17/17 0735  . polyethylene glycol (MIRALAX / GLYCOLAX) packet 17 g  17 g Oral Daily Nita Sells, MD   17 g at 01/17/17 1207  . pravastatin (PRAVACHOL) tablet 80 mg  80 mg Oral q1800 Nita Sells, MD   80 mg at 01/16/17 1757  . sodium chloride flush (NS) 0.9 % injection 3 mL  3 mL Intravenous Q12H Opyd, Ilene Qua, MD   3 mL at 01/16/17 2337  . sodium chloride  flush (NS) 0.9 % injection 3 mL  3 mL Intravenous PRN Opyd, Ilene Qua, MD      . sodium phosphate (FLEET) 7-19 GM/118ML enema 1 enema  1 enema Rectal Daily PRN Nita Sells, MD      . vitamin C (ASCORBIC ACID) tablet 500 mg  500 mg Oral Daily Opyd, Ilene Qua, MD   500 mg at 01/17/17 1206  . warfarin (COUMADIN) tablet 3 mg  3 mg Oral ONCE-1800 Kris Mouton, [facility]      . Warfarin - Pharmacist Dosing Inpatient   Does not apply q1800 Honor Loh, [facility]        Musculoskeletal: Strength & Muscle Tone: decreased Gait & Station: unable to stand Patient leans: N/A  Psychiatric Specialty Exam: Physical Exam as per history and physical   ROS generalized weakness, anxiety, decreased mobility, unstable on her feet, recent fall which leads to fractures of the ribs and laceration on her forehead. Complains of disturbed sleep, sleeping more during the daytime and staying up at night and poor appetite.  No Fever-chills, No Headache, No changes with Vision or hearing, reports vertigo No problems swallowing food or Liquids, No Chest pain, Cough or Shortness of Breath, No Abdominal pain, No Nausea or Vommitting, Bowel movements are regular, No Blood in stool or Urine, No dysuria, No new skin rashes or bruises, No new joints pains-aches,  No new weakness, tingling, numbness in any extremity, No recent weight gain or loss, No polyuria, polydypsia or polyphagia,  A full 10 point Review of Systems was done, except as stated above, all other Review of Systems were negative.  Blood pressure (!) 148/71, pulse 60, temperature 97.6 F (36.4 C), temperature source Oral, resp. rate 15, height 4' 11"  (1.499 m), weight 48.5 kg (107 lb), SpO2 100 %.Body mass index is 21.61 kg/m.  General Appearance: Guarded  Eye Contact:  Good  Speech:  Slow  Volume:  Decreased  Mood:  Anxious and Depressed  Affect:  Constricted and Depressed  Thought Process:  Coherent and Goal Directed  Orientation:  Full (Time,  Place, and Person)  Thought Content:  Rumination  Suicidal Thoughts:  No  Homicidal Thoughts:  No  Memory:  Immediate;  Good Recent;   Fair Remote;   Fair  Judgement:  Fair  Insight:  Fair  Psychomotor Activity:  Decreased  Concentration:  Concentration: Fair and Attention Span: Fair  Recall:  [employer] of Knowledge:  Good  Language:  Good  Akathisia:  Negative  Handed:  Right  AIMS (if indicated):     Assets:  Communication Skills Desire for Improvement Financial Resources/Insurance Housing Leisure Time Resilience Social Support Transportation  ADL's:  Impaired  Cognition:  WNL  Sleep:        Treatment Plan Summary: 83 years old female with multiple medical problems including paroxysmal atrial fibrillation, systolic congestive heart failure and recent fall presented with the excessive anxiety which leads to shortness of breath and disturbed sleep and appetite.  Anxiety secondary to general medical condition Generalized anxiety  Recommendation: Start escitalopram 5 mg daily at bedtime for anxiety  Start mirtazapine 7.5 mg at bedtime for insomnia and poor appetite   Daily contact with patient to assess and evaluate symptoms and progress in treatment and Medication management   Appreciate psychiatric consultation and follow up as clinically required Please contact [phone] or [phone] if needs further assistance  Disposition: No evidence of imminent risk to self or others at present.   Supportive therapy provided about ongoing stressors.  Ambrose Finland, MD 01/17/2017 2:01 PM

## 2017-01-17 NOTE — Progress Notes (Signed)
Valerie Terrell has been very anxious and scared all night.  She feels that she needs more oxygen.  She is on 4L [place] and her pulse oxymetry is consistently 94-97%.  She does not want staff to leave the room and calls out frequently.  At times, her verbal responses reveal a mild level of cognitive impairment.  She repeatedly askes to go to the [facility] ER because they have her records and gives me her home address.  When I reorient her to being at [facility], she acts like she knew this the whole time.  The cycle then repeats itself as she gets more agitated.   She also complains of back pain and flank pain from a broken rib she received prior to admission. I requested that she have her Percocet order continued.  The order was received.

## 2017-01-18 DIAGNOSIS — I428 Other cardiomyopathies: Secondary | ICD-10-CM

## 2017-01-18 DIAGNOSIS — I5043 Acute on chronic combined systolic (congestive) and diastolic (congestive) heart failure: Secondary | ICD-10-CM

## 2017-01-18 DIAGNOSIS — N183 Chronic kidney disease, stage 3 (moderate): Secondary | ICD-10-CM

## 2017-01-18 DIAGNOSIS — I480 Paroxysmal atrial fibrillation: Secondary | ICD-10-CM

## 2017-01-18 DIAGNOSIS — N179 Acute kidney failure, unspecified: Secondary | ICD-10-CM

## 2017-01-18 LAB — PROTIME-INR
INR: 2.98
PROTHROMBIN TIME: 30.8 s — AB (ref 11.4–15.2)

## 2017-01-18 LAB — CULTURE, BLOOD (ROUTINE X 2)
Culture: NO GROWTH
Culture: NO GROWTH
SPECIAL REQUESTS: ADEQUATE
SPECIAL REQUESTS: ADEQUATE

## 2017-01-18 MED ORDER — MAGNESIUM HYDROXIDE 400 MG/5ML PO SUSP: 30.0000 mL | Freq: Every day | ORAL | Status: DC | PRN

## 2017-01-18 MED ORDER — AMIODARONE HCL 200 MG PO TABS
200.0000 mg | ORAL_TABLET | Freq: Every day | ORAL | Status: DC
  Administered 2017-01-19 – 2017-02-03 (×16): 200 mg via ORAL
  Filled 2017-01-18 (×18): qty 1

## 2017-01-18 MED ORDER — BISACODYL 10 MG RE SUPP
10.0000 mg | Freq: Every day | RECTAL | Status: DC | PRN
  Administered 2017-01-18 – 2017-01-20 (×2): 10 mg via RECTAL
  Filled 2017-01-18 (×2): qty 1

## 2017-01-18 NOTE — Progress Notes (Signed)
ANTICOAGULATION CONSULT NOTE - Follow Up Consult  Pharmacy Consult for Coumadin Indication: atrial fibrillation  Allergies  Allergen Reactions  . Clonidine Derivatives Palpitations and Other (See Comments)    Very sedated  . Codeine Nausea Only  . Nickel Rash  . Sulfa Antibiotics Other (See Comments)    Reaction unknown   Patient Measurements: Height: 4\' 11"  (149.9 cm) Weight: 110 lb (49.9 kg) IBW/kg (Calculated) : 43.2  Vital Signs: Temp: 98 F (36.7 C) (09/03 0353) Temp Source: Oral (09/03 0353) BP: 139/52 (09/03 0353) Pulse Rate: 60 (09/03 0353)  Labs:  Recent Labs  01/16/17 0601 01/17/17 0558 01/17/17 0759 01/17/17 0801 01/18/17 0430  HGB  --   --   --  8.3*  --   HCT  --   --   --  27.1*  --   PLT  --   --   --  251  --   LABPROT 32.4* SPECIMEN CLOTTED 28.0*  --  30.8*  INR 3.19 SPECIMEN CLOTTED 2.64  --  2.98  CREATININE 2.33*  --  2.47*  --   --    Estimated Creatinine Clearance: 12.2 mL/min (A) (by C-G formula based on SCr of 2.47 mg/dL (H)).  Assessment:  83 yr old female on Coumadin for afib.  INR therapeutic at 2.98 but bumped up after her 3mg  dose yesterday.  Will be a bit conservative and not overshoot goal.  Her CBC is stable, as is her platelets and no noted bleeding.   Home Coumadin regimen: 3 mg TTSS, 1.5 mg MWF   Goal of Therapy:  INR 2-3 Monitor platelets by anticoagulation protocol: Yes   Plan:  Hold Warfarin today Daily PT/INR.  Rober Minion, PharmD., MS Clinical Pharmacist Pager:  [phone] Thank you for allowing pharmacy to be part of this patients care team.  01/18/2017 9:28 AM

## 2017-01-18 NOTE — Progress Notes (Signed)
PROGRESS NOTE    Valerie Terrell  ZMO:[id number] DOB: 12/15/1932 DOA: 01/10/2017 PCP: Tammi Sou, MD   Brief Narrative: 83 year old female with history of chronic combined diastolic and systolic heart failure, chronic kidney disease, coronary artery disease, paroxysmal 18/ sick sinus syndrome is status post permanent pacemaker on Coumadin, on chronic oxygen at home 3-4 L, dyslipidemia, hypertension presented to ER for the evaluation of bleeding head wound and back pain that occurred after a fall. In the ER patient was hypoxic to 88% on supplemental oxygen but tachypneic, mildly hypertensive. Chest x-ray showed nondisplaced right ninth rib fracture without completing features and multiple old rib fractures with healing. She was treated with IV Lasix for CHF.  Assessment & Plan:   # Acute on chronic hypoxic respiratory failure: In the setting of CHF and may have left lower lobe atelectasis/pneumonia -Chest x-ray showed interstitial pulmonary edema, stable bilateral pleural effusion and possible left lower lobe atelectasis -Currently on baseline might using about 2 L of oxygen. -Continue Lasix, Levaquin -Cardiology consult requested  #Nonischemic cardiomyopathy EF of 35-40% by echo in July.  #Paroxysmal atrial fibrillation: Continue amiodarone and Coumadin. Monitor heart rate and INR. INR is therapeutic.  #Tachycardia bradycardic syndrome status post pacemaker placement in July.  #Chronic kidney disease stage IV: Serum creatinine level around baseline. Monitor BMP. Avoid toxins. May need to reduce the dose of Lasix. Serum creatinine level increases.  #Essential hypertension: Monitor blood pressure. Continue amlodipine, Lasix, Coreg, hydralazine, Imdur  #Undisplaced right ninth rib fracture without complicating feature, multiple old right rib fractures with healing: Pain management, incentive spirometer and supportive care. PT OT evaluation.  #Generalized anxiety/depression: Started  Lexapro 5 mg daily at bedtime and mirtazapine 7.5 mg at bedtime. Encourage oral intake. Evaluated by psychiatrist.  #Atelectasis versus left lower lobe pneumonia: Continue oral Levaquin. Monitor leukocytosis. Repeat lab in the morning.  DVT prophylaxis: Coumadin Code Status: Full code Family Communication: No family at bedside Disposition Plan: Currently admitted    Consultants:   Cardiology  Procedures: None Antimicrobials: Levaquin  Subjective: Seen and examined at bedside. Shortness of breath is mildly improved. Feels weak tired and no energy. Denied headache, chest pain. Working with physical therapist.  Objective: Vitals:   01/17/17 1646 01/17/17 2000 01/17/17 2007 01/18/17 0353  BP: 95/74 137/88 (!) 146/52 (!) 139/52  Pulse: 64 65 65 60  Resp:  18  18  Temp:  97.6 F (36.4 C)  98 F (36.7 C)  TempSrc:  Oral  Oral  SpO2: 98% 97% 99% 95%  Weight:    49.9 kg (110 lb)  Height:        Intake/Output Summary (Last 24 hours) at 01/18/17 1336 Last data filed at 01/18/17 0403  Gross per 24 hour  Intake                0 ml  Output              300 ml  Net             -300 ml   Filed Weights   01/16/17 0441 01/17/17 0524 01/18/17 0353  Weight: 52.3 kg (115 lb 6.4 oz) 48.5 kg (107 lb) 49.9 kg (110 lb)    Examination:  General exam: Ill-looking female sitting on bed Respiratory system: Bilateral coarse crackle mostly on bases, no wheezing Cardiovascular system: Regular rate rhythm S1-S2 normal. No lower extremities edema. Gastrointestinal system: Abdomen is nondistended, soft and nontender. Normal bowel sounds heard. Central nervous system: Alert awake and  following commands Extremities: Symmetric 5 x 5 power. Skin: No rashes, lesions or ulcers   Data Reviewed: I have personally reviewed following labs and imaging studies  CBC:  Recent Labs Lab 01/13/17 0524 01/14/17 0439 01/15/17 0337 01/17/17 0801  WBC 11.2* 12.5* 8.5 11.8*  NEUTROABS 8.8*  --  6.7 9.8*   HGB 8.3* 8.4* 8.4* 8.3*  HCT 27.4* 27.2* 27.8* 27.1*  MCV 95.8 96.5 95.5 95.1  PLT 209 228 235 326   Basic Metabolic Panel:  Recent Labs Lab 01/13/17 0524 01/14/17 0439 01/15/17 0337 01/16/17 0632 01/17/17 0759  NA 135 137 137 137 132*  K 4.2 4.5 4.0 4.2 4.2  CL 93* 96* 96* 94* 91*  CO2 32 33* 32 30 29  GLUCOSE 107* 128* 114* 106* 106*  BUN 53* 56* 56* 56* 58*  CREATININE 2.27* 2.28* 2.23* 2.33* 2.47*  CALCIUM 9.2 9.7 9.5 9.6 9.4  PHOS  --  4.0 3.1  --   --    GFR: Estimated Creatinine Clearance: 12.2 mL/min (A) (by C-G formula based on SCr of 2.47 mg/dL (H)). Liver Function Tests:  Recent Labs Lab 01/13/17 0524 01/14/17 0439 01/15/17 0337 01/17/17 0759  AST 20  --   --  23  ALT 14  --   --  18  ALKPHOS 51  --   --  52  BILITOT 0.7  --   --  0.4  PROT 6.4*  --   --  6.4*  ALBUMIN 3.2* 3.2* 3.1* 3.1*   No results for input(s): LIPASE, AMYLASE in the last 168 hours. No results for input(s): AMMONIA in the last 168 hours. Coagulation Profile:  Recent Labs Lab 01/15/17 0337 01/16/17 7124 01/17/17 0558 01/17/17 0759 01/18/17 0430  INR 3.35 3.19 SPECIMEN CLOTTED 2.64 2.98   Cardiac Enzymes: No results for input(s): CKTOTAL, CKMB, CKMBINDEX, TROPONINI in the last 168 hours. BNP (last 3 results) No results for input(s): PROBNP in the last 8760 hours. HbA1C: No results for input(s): HGBA1C in the last 72 hours. CBG: No results for input(s): GLUCAP in the last 168 hours. Lipid Profile: No results for input(s): CHOL, HDL, LDLCALC, TRIG, CHOLHDL, LDLDIRECT in the last 72 hours. Thyroid Function Tests: No results for input(s): TSH, T4TOTAL, FREET4, T3FREE, THYROIDAB in the last 72 hours. Anemia Panel: No results for input(s): VITAMINB12, FOLATE, FERRITIN, TIBC, IRON, RETICCTPCT in the last 72 hours. Sepsis Labs: No results for input(s): PROCALCITON, LATICACIDVEN in the last 168 hours.  Recent Results (from the past 240 hour(s))  Culture, blood (routine x  2) Call MD if unable to obtain prior to antibiotics being given     Status: None   Collection Time: 01/13/17 12:55 PM  Result Value Ref Range Status   Specimen Description BLOOD LEFT HAND  Final   Special Requests IN PEDIATRIC BOTTLE Blood Culture adequate volume  Final   Culture NO GROWTH 5 DAYS  Final   Report Status 01/18/2017 FINAL  Final  Culture, blood (routine x 2) Call MD if unable to obtain prior to antibiotics being given     Status: None   Collection Time: 01/13/17 12:56 PM  Result Value Ref Range Status   Specimen Description BLOOD LEFT FOREARM  Final   Special Requests IN PEDIATRIC BOTTLE Blood Culture adequate volume  Final   Culture NO GROWTH 5 DAYS  Final   Report Status 01/18/2017 FINAL  Final         Radiology Studies: No results found.      Scheduled  Meds: . acetaminophen  1,000 mg Oral QHS  . [START ON 01/19/2017] amiodarone  200 mg Oral Daily  . amLODipine  5 mg Oral Daily  . calcium-vitamin D  1 tablet Oral BID  . carvedilol  25 mg Oral BID WC  . diclofenac  1 patch Transdermal BID  . escitalopram  5 mg Oral QHS  . furosemide  60 mg Intravenous Q12H  . hydrALAZINE  100 mg Oral TID  . isosorbide mononitrate  60 mg Oral Daily  . levofloxacin  500 mg Oral Q48H  . mirtazapine  7.5 mg Oral QHS  . multivitamin with minerals  1 tablet Oral Once per day on Mon Wed Fri  . polyethylene glycol  17 g Oral Daily  . pravastatin  80 mg Oral q1800  . sodium chloride flush  3 mL Intravenous Q12H  . vitamin C  500 mg Oral Daily  . Warfarin - Pharmacist Dosing Inpatient   Does not apply q1800   Continuous Infusions: . sodium chloride    . ferumoxytol Stopped (01/12/17 1015)     LOS: 7 days    Ferguson Gertner Tanna Furry, MD [facility] Pager [phone]  If 7PM-7AM, please contact night-coverage [email or web address] Password [email or web address] 01/18/2017, 1:36 PM

## 2017-01-18 NOTE — Consult Note (Signed)
Cardiology Consultation:   [name]; [id number]; 08/12/1932   Admit date: 01/10/2017 Date of Consult: 01/18/2017  Primary Care Provider: Tammi Sou, MD Primary Cardiologist: Dr. Quay Burow Primary Electrophysiologist:  Dr. Cristopher Peru   Patient Profile:   Valerie Terrell is an 83 y.o. female with a history of chronic combined heart failure with recent LVEF 35-40% range, previous subarachnoid hemorrhage, renal artery stenosis, hypertension, hyperlipidemia, CKD stage III, tachycardia-bradycardia syndrome status post recent pacemaker placement in July, and paroxysmal atrial fibrillation on Coumadin who is being seen today for the evaluation of acute on chronic combined heart failure at the request of Dr. Verlon Au.  History of Present Illness:   Valerie Terrell is currently admitted to the hospital after a mechanical fall, found to be hypoxic on ER assessment and also with multiple old right rib fractures and a nondisplaced right ninth rib fracture. She has had evidence of acute on chronic combined heart failure with volume overload, chest x-ray showing pulmonary edema, and her Lasix dose has been adjusted with reasonable diuresis beginning within the last 24 hours. She does have renal insufficiency although creatinine has been relatively stable in the range of 2.2-2.4.  Other active comorbidities include possible aspiration pneumonia being treated with antibiotics per the primary team. She has also had infusion and significant anxiety, has been evaluated by the psychiatry team.  Heart rhythm has been relatively stable however, currently with atrial pacing by telemetry on amiodarone and Coreg.  Past Medical History:  Diagnosis Date  . Atrophy of left kidney    with absent blood flow by renal artery dopplers (Dr. Gwenlyn Found)  . 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 Chronic combined systolic and diastolic CHF (congestive heart failure) ([place])    a. 11/2016: echo  showing EF of 35-40%, RV strain noted, mild MR and mild TR.   [name] Chronic renal insufficiency, stage 4 (severe) (HCC)    Baseline GFR 25 ml/min-- renal u/s showed atrophic/hypoplastic left kidney.  No hydronephrosis.  Small right renal cyst.  . History of adenomatous polyp of colon   . History of subarachnoid hemorrhage 10/2014   after syncope and while on xarelto  . Hyperlipidemia   . Hypertension    Difficult to control, in the setting of one functioning kidney: pt was referred to nephrology by Dr. Gwenlyn Found 06/2015.  . Lumbar radiculopathy 2012  . Lumbar spondylosis    MR 07/2016---no sign of spinal nerve compression or cord compression.  Pt set up with outpt ortho while admitted to hosp 07/2016.  . Metatarsal fracture 06/10/2016   Nondisplaced, left 5th metatarsal--pt was referred to ortho  . Nonischemic cardiomyopathy ([place])   . Osteopenia 2014   T-score -2.1  . PAF (paroxysmal atrial fibrillation) (HCC)    Eliquis started after BRAO and CVA.  She was changed to warfarin 2018.  . Pulmonary edema 11/2016  . Stroke [facility])    cardioembolic (had CVA while on no anticoag)--"scattered subacute punctate infarcts: 1 in R parietal lobe and 2 in occipital cortex" on MRI br.  CT angio head/neck: aortic arch athero.   . Thoracic compression fracture ([place]) 07/2016   T7 and T8-- T8 kyphoplasty during hosp admission 07/2016.  Neuro referred pt to pain mgmt for consideration of injection 09/2016.  I referred her to endo 08/2016 for consideration of calcitonin treatment.    Past Surgical History:  Procedure Laterality Date  . APPENDECTOMY  child  . CARDIOVASCULAR STRESS TEST  01/31/2016   Stress  myoview: NORMAL/Low risk.  EF 56%.  . [place]  . COLONOSCOPY  2015   + hx of adenomatous polyps.  Need digest health spec in [place] records to see when pt due for next colonoscopy  . IR GENERIC HISTORICAL  07/24/2016   IR KYPHO THORACIC WITH BONE BIOPSY 07/24/2016 Luanne Bras, MD [facility]  .  KYPHOPLASTY  07/27/2016   T8  . PACEMAKER IMPLANT N/A 12/03/2016   Procedure: Pacemaker Implant;  Surgeon: Evans Lance, MD;  Location: [facility];  Service: Cardiovascular;  Laterality: N/A;  . Renal artery dopplers  02/26/2016   Her right renal dimension was 11 cm pole to pole with mild to moderate right renal artery stenosis. A right renal aortic ratio was 3.22 suggesting less than a 50% stenosis.  . TONSILLECTOMY    . TRANSTHORACIC ECHOCARDIOGRAM  01/29/2016; 11/2016   2017: EF 55-60%, normal LV wall motion, grade I DD.  No cardiac source of emboli was seen.  2018: EF 35-40%, could not assess DD due to a-fib, pulm HTN noted.     Inpatient Medications: Scheduled Meds: . acetaminophen  1,000 mg Oral QHS  . amiodarone  200 mg Oral BID  . amLODipine  5 mg Oral Daily  . calcium-vitamin D  1 tablet Oral BID  . carvedilol  25 mg Oral BID WC  . diclofenac  1 patch Transdermal BID  . escitalopram  5 mg Oral QHS  . furosemide  60 mg Intravenous Q12H  . hydrALAZINE  100 mg Oral TID  . isosorbide mononitrate  60 mg Oral Daily  . levofloxacin  500 mg Oral Q48H  . mirtazapine  7.5 mg Oral QHS  . multivitamin with minerals  1 tablet Oral Once per day on Mon Wed Fri  . polyethylene glycol  17 g Oral Daily  . pravastatin  80 mg Oral q1800  . sodium chloride flush  3 mL Intravenous Q12H  . vitamin C  500 mg Oral Daily  . Warfarin - Pharmacist Dosing Inpatient   Does not apply q1800   Continuous Infusions: . sodium chloride    . ferumoxytol Stopped (01/12/17 1015)   PRN Meds: sodium chloride, acetaminophen, albuterol, hydrALAZINE, morphine CONCENTRATE, ondansetron (ZOFRAN) IV, sodium chloride flush, sodium phosphate  Allergies:    Allergies  Allergen Reactions  . Clonidine Derivatives Palpitations and Other (See Comments)    Very sedated  . Codeine Nausea Only  . Nickel Rash  . Sulfa Antibiotics Other (See Comments)    Reaction unknown    Social History:   Social History     Social History  . Marital status: Widowed    Spouse name: N/A  . Number of children: N/A  . Years of education: N/A   Occupational History  . Not on file.   Social History Main Topics  . Smoking status: Never Smoker  . Smokeless tobacco: Never Used  . Alcohol use 0.6 oz/week    1 Glasses of wine per week     Comment: wine  . Drug use: No  . Sexual activity: No   Other Topics Concern  . Not on file   Social History Narrative   Widow.  One daughter, lives with her.   Educ: college   Occup: retired Marine scientist.   No T/A/Ds.   She is almost a vegetarian.    Family History:   The patient's family history includes Cancer in her mother; Early death in her father; Heart disease in her father.  There is no history of Sudden Cardiac Death.  ROS:  Please see the history of present illness. Confusion about situation, decreased appetite with some abdominal fullness. No chest pain. Intermittent shortness of breath and orthopnea. All other ROS reviewed and negative.     Physical Exam/Data:   Vitals:   01/17/17 1646 01/17/17 2000 01/17/17 2007 01/18/17 0353  BP: 95/74 137/88 (!) 146/52 (!) 139/52  Pulse: 64 65 65 60  Resp:  18  18  Temp:  97.6 F (36.4 C)  98 F (36.7 C)  TempSrc:  Oral  Oral  SpO2: 98% 97% 99% 95%  Weight:    110 lb (49.9 kg)  Height:        Intake/Output Summary (Last 24 hours) at 01/18/17 1200 Last data filed at 01/18/17 0403  Gross per 24 hour  Intake                0 ml  Output              300 ml  Net             -300 ml   Filed Weights   01/16/17 0441 01/17/17 0524 01/18/17 0353  Weight: 115 lb 6.4 oz (52.3 kg) 107 lb (48.5 kg) 110 lb (49.9 kg)   Body mass index is 22.22 kg/m.   Gen: Frail-appearing elderly woman in no distress. HEENT: Ecchymosis was knot right forehead. Conjunctiva and lids normal, oropharynx clear with moist mucosa. Neck: Supple, mildly elevated JVP, no thyromegaly. Lungs: Decreased breath sounds at the bases with slight  egophony, nonlabored breathing at rest. Cardiac: Regular rate and rhythm, no S3, 2/6 systolic murmur, no pericardial rub. Abdomen: Soft, nontender, bowel sounds present, no guarding or rebound. Extremities: Mild ankle edema, distal pulses 1-2+. Skin: Warm and dry. Musculoskeletal: No kyphosis. Neuropsychiatric: Alert and oriented x3, affect grossly appropriate.  EKG:  I personally reviewed the tracing from 01/10/2017 which showed sinus rhythm with R' in lead V1 and V2.   Telemetry:  I personally reviewed telemetry which shows an atrial paced rhythm.   Relevant CV Studies:  Echocardiogram 12/02/2016: Study Conclusions  - Left ventricle: The cavity size was normal. There was moderate   concentric hypertrophy. Systolic function was moderately reduced.   The estimated ejection fraction was in the range of 35% to 40%.   Wall motion was normal; there were no regional wall motion   abnormalities. The study was not technically sufficient to allow   evaluation of LV diastolic dysfunction due to atrial   fibrillation. - Aortic valve: Valve mobility was restricted. Transvalvular   velocity was within the normal range. There was no stenosis.   There was no regurgitation. - Mitral valve: There was mild regurgitation. - Left atrium: The atrium was mildly dilated. - Right ventricle: The cavity size was moderately dilated. Wall   thickness was normal. Systolic function was moderately reduced. - Tricuspid valve: There was mild regurgitation. - Pulmonary arteries: Systolic pressure was moderately increased.   PA peak pressure: 47 mm Hg (S). - Inferior vena cava: The vessel was normal in size. - Pericardium, extracardiac: A trivial pericardial effusion was   identified posterior to the heart. Features were not consistent   with tamponade physiology.  Impressions:  - When compared to the prior study from 07/14/2016 LVEF has   decreased from 60-65% to 35-40%. RV is moderately dilated with    moderately decreased RVEF. There is RV strain. Consider workup   for pulmonary embolism.  The study acquired while the patient in atrial fibrillation with   RVR.  Laboratory Data:  Chemistry Recent Labs Lab 01/15/17 0337 01/16/17 0632 01/17/17 0759  NA 137 137 132*  K 4.0 4.2 4.2  CL 96* 94* 91*  CO2 32 30 29  GLUCOSE 114* 106* 106*  BUN 56* 56* 58*  CREATININE 2.23* 2.33* 2.47*  CALCIUM 9.5 9.6 9.4  GFRNONAA 19* 18* 17*  GFRAA 23* 21* 20*  ANIONGAP 9 13 12      Recent Labs Lab 01/13/17 0524 01/14/17 0439 01/15/17 0337 01/17/17 0759  PROT 6.4*  --   --  6.4*  ALBUMIN 3.2* 3.2* 3.1* 3.1*  AST 20  --   --  23  ALT 14  --   --  18  ALKPHOS 51  --   --  52  BILITOT 0.7  --   --  0.4   Hematology Recent Labs Lab 01/14/17 0439 01/14/17 1150 01/15/17 0337 01/17/17 0801  WBC 12.5*  --  8.5 11.8*  RBC 2.82* 2.76* 2.91* 2.85*  HGB 8.4*  --  8.4* 8.3*  HCT 27.2*  --  27.8* 27.1*  MCV 96.5  --  95.5 95.1  MCH 29.8  --  28.9 29.1  MCHC 30.9  --  30.2 30.6  RDW 15.9*  --  15.5 15.6*  PLT 228  --  235 251    Radiology/Studies:  Dg Chest 2 View  Result Date: 01/16/2017 CLINICAL DATA:  Chronic 7+ month history of shortness of breath. Follow-up CHF and pleural effusions. Current history of COPD, hypertension and CHF. EXAM: CHEST  2 VIEW COMPARISON:  01/15/2017, 01/13/2017 and dating back to 01/28/2016. FINDINGS: AP semi-erect and lateral images were obtained. Cardiac silhouette markedly enlarged, unchanged. Left subclavian dual lead transvenous pacemaker unchanged and intact. Thoracic aorta atherosclerotic, unchanged. Hilar and mediastinal contours otherwise unremarkable. Interval resolution of interstitial pulmonary edema since yesterday. Stable bilateral pleural effusions, left greater than right and associated consolidation in the left lower lobe. No new pulmonary parenchymal abnormalities. Prior augmentation of a mid thoracic vertebral body. IMPRESSION: 1. Resolution of  interstitial pulmonary edema since yesterday. 2. Stable bilateral pleural effusions, left greater than right, with associated passive atelectasis in the left lower lobe. 3. No new abnormalities. Electronically Signed   By: Evangeline Dakin M.D.   On: 01/16/2017 13:54   Dg Chest Port 1 View  Result Date: 01/15/2017 CLINICAL DATA:  Hypoxia. EXAM: PORTABLE CHEST 1 VIEW COMPARISON:  01/13/2017 FINDINGS: Left chest wall pacer device is noted with lead in the right atrial appendage and right ventricle. There is cardiac enlargement and aortic atherosclerosis. Bilateral pleural effusions are noted left greater than right. Mild to moderate pulmonary edema. IMPRESSION: 1. Mild to moderate congestive heart failure. 2.  Aortic Atherosclerosis (ICD10-I70.0). Electronically Signed   By: Kerby Moors M.D.   On: 01/15/2017 08:19    Assessment and Plan:   1. Acute on chronic combined heart failure. Recent follow-up chest x-ray on 9/1 shows improving interstitial edema with stable bilateral pleural effusions, left greater than right. Lasix dose has been adjusted during stay, currently at 60 mg IV every 12 hours.  2. Nonischemic cardiomyopathy, LVEF 35-40% range by echocardiogram in July in the setting of atrial fibrillation. Also had moderate right ventricular dysfunction at that time as well.  3. Paroxysmal atrial fibrillation, maintaining sinus rhythm at this point on amiodarone and Coumadin. INR is therapeutic at 2.9.  4. Tachycardia-bradycardia syndrome status post [employer] pacemaker placement by Dr.  Lovena Le in July. Telemetry shows atrial paced rhythm at this time.  5. Acute on chronic renal insufficiency, creatinine 2.4. Looks to have CKD stage 3 at baseline.  Discussed with patient and daughter in room. For now would continue with current dose of IV Lasix, follow-up BMET tomorrow. She is just beginning to show reasonable diuresis on this dose, 800 cc out more than last 24 hours. May need to reduce dose if  creatinine continues to climb. Would also reduce amiodarone to 200 mg once daily at this point. Our service will follow with you.    Signed, Rozann Lesches, MD  01/18/2017 12:00 PM

## 2017-01-18 NOTE — Progress Notes (Signed)
Pt is confused and calling daughter with loose thoughts and delirium has not been sleeping and anxiety at night. Daughter has been at bedside 3 times or  More for over a week and stated that she was exhausted  Psych saw her today and added Remeron and Lexapro to help stabilize and regulate sleep pattern. Daughter was guarded I educated her on the benefits of medication and will monitor patient for an adverse effects.

## 2017-01-18 NOTE — Progress Notes (Signed)
Occupational Therapy Treatment Patient Details Name: Valerie Terrell MRN: [id number] DOB: May 27, 1932 Today's Date: 01/18/2017    History of present illness Pt is an 83 y.o. female who presented to the ED for evaluation of bleeding head wound and R back pain after a fall. In the ED, hypoxic at 88% on her supplemental oxygen, tachypneic, mildly hypertensive, chest x-ray showed nondisplaced right ninth rib fracture without complicating features as well as multiple old right rib fractures with healing. She was briefly placed on nonrebreather mask. Admitted for acute on chronic hypoxic respiratory failure secondary to acute on chronic systolic and diastolic CHF and chest wall splinting related to pain from rib fracture. PMH significant for: chronic combined diastolic and systolic CHF, NICM, stage IV chronic kidney disease, CAD, PAF on warfarin, home oxygen 3-4 L/m, HLD, HTN, and CVA.   OT comments  Pt's demonstrating anxiety, poor activity tolerance and decreased standing balance. She requires mod to max assist for dressing and performs grooming in sitting with set up. Pt with desaturation to 84% on 3L, required increase of 02 to 4L and seated rest break to recover to 90-91%. Encouraged pursed lip breathing and use of incentive spirometer. Will continue to follow.  Follow Up Recommendations  Home health OT;Supervision/Assistance - 24 hour    Equipment Recommendations  3 in 1 bedside commode;Tub/shower seat    Recommendations for Other Services      Precautions / Restrictions Precautions Precautions: Fall Restrictions Weight Bearing Restrictions: No       Mobility Bed Mobility Overal bed mobility: Needs Assistance Bed Mobility: Supine to Sit     Supine to sit: HOB elevated;Min assist     General bed mobility comments: cues for sequencing and assist to come up into sitting  Transfers Overall transfer level: Needs assistance Equipment used: Rolling walker (2 wheeled) Transfers: Sit  to/from [facility] Sit to Stand: Min assist Stand pivot transfers: Min assist       General transfer comment: cues for safety and hand placement/technique; assist to stand and for balance upon stand; mod A for stand pivot without AD recliner <> BSC    Balance Overall balance assessment: Needs assistance Sitting-balance support: Bilateral upper extremity supported;No upper extremity supported Sitting balance-Leahy Scale: Fair     Standing balance support: Bilateral upper extremity supported Standing balance-Leahy Scale: Poor Standing balance comment: Reliant on external assistance or UE support.                            ADL either performed or assessed with clinical judgement   ADL Overall ADL's : Needs assistance/impaired Eating/Feeding: Set up;Sitting   Grooming: Wash/dry hands;Wash/dry face;Sitting;Set up           Upper Body Dressing : Moderate assistance;Sitting   Lower Body Dressing: Maximal assistance;Bed level   Toilet Transfer: Minimal assistance;Stand-pivot;BSC;RW   Toileting- Clothing Manipulation and Hygiene: Minimal assistance;Sit to/from stand       Functional mobility during ADLs: Rolling walker;Moderate assistance General ADL Comments: pt very focused on her 02 saturations, desats on 3L to 84% with ambulation, increased 02 to 4L, requiring seated rest break to rebound to 91% on 4L     Vision       Perception     Praxis      Cognition Arousal/Alertness: Awake/alert Behavior During Therapy: Flat affect;Anxious Overall Cognitive Status: Impaired/Different from baseline Area of Impairment: Following commands;Attention;Safety/judgement;Problem solving  Orientation Level: Time (year) Current Attention Level: Selective   Following Commands: Follows one step commands with increased time Safety/Judgement: Decreased awareness of deficits;Decreased awareness of safety Awareness: Emergent Problem  Solving: Slow processing;Decreased initiation;Requires verbal cues;Requires tactile cues General Comments: anxiety limits pt's mobility        Exercises     Shoulder Instructions       General Comments SpO2 85-95% on 3-5L O2 via [place] throughout session; pt resting end of session with 4L O2 via [place] with SpO2 >90%    Pertinent Vitals/ Pain       Pain Assessment: No/denies pain  Home Living                                          Prior Functioning/Environment              Frequency  Min 2X/week        Progress Toward Goals  OT Goals(current goals can now be found in the care plan section)  Progress towards OT goals: Progressing toward goals  Acute Rehab OT Goals Patient Stated Goal: to go home OT Goal Formulation: With patient Time For Goal Achievement: 01/26/17 Potential to Achieve Goals: Good  Plan Discharge plan remains appropriate    Co-evaluation    PT/OT/SLP Co-Evaluation/Treatment: Yes Reason for Co-Treatment: For patient/therapist safety PT goals addressed during session: Mobility/safety with mobility OT goals addressed during session: ADL's and self-care      AM-PAC PT "6 Clicks" Daily Activity     Outcome Measure   Help from another person eating meals?: A Little Help from another person taking care of personal grooming?: A Little Help from another person toileting, which includes using toliet, bedpan, or urinal?: A Lot Help from another person bathing (including washing, rinsing, drying)?: A Lot Help from another person to put on and taking off regular upper body clothing?: A Lot Help from another person to put on and taking off regular lower body clothing?: A Lot 6 Click Score: 14    End of Session Equipment Utilized During Treatment: Gait belt;Rolling walker;Oxygen  OT Visit Diagnosis: Unsteadiness on feet (R26.81);Muscle weakness (generalized) (M62.81);Pain   Activity Tolerance Patient limited by fatigue (and anxiety)    Patient Left with call bell/phone within reach;in chair;with chair alarm set   Nurse Communication          Time: [phone] OT Time Calculation (min): 42 min  Charges: OT General Charges $OT Visit: 1 Visit OT Treatments $Self Care/Home Management : 8-22 mins   Malka So 01/18/2017, 10:42 AM 01/18/2017 [name], OTR/L Pager: [phone]

## 2017-01-18 NOTE — Progress Notes (Signed)
Physical Therapy Treatment Patient Details Name: Valerie Terrell MRN: [id number] DOB: December 18, 1932 Today's Date: 01/18/2017    History of Present Illness Pt is an 83 y.o. female who presented to the ED for evaluation of bleeding head wound and R back pain after a fall. In the ED, hypoxic at 88% on her supplemental oxygen, tachypneic, mildly hypertensive, chest x-ray showed nondisplaced right ninth rib fracture without complicating features as well as multiple old right rib fractures with healing. She was briefly placed on nonrebreather mask. Admitted for acute on chronic hypoxic respiratory failure secondary to acute on chronic systolic and diastolic CHF and chest wall splinting related to pain from rib fracture. PMH significant for: chronic combined diastolic and systolic CHF, NICM, stage IV chronic kidney disease, CAD, PAF on warfarin, home oxygen 3-4 L/m, HLD, HTN, and CVA.    PT Comments    Patient agreeable to OOB mobility this session. Pt continues to be limited by anxiety however able to tolerate ambulation of 74ft with min/mod A and +2 for safety with equipment. Pt with SpO2 85-95% on 3-5L O2 via [place] throughout session. Pt resting with 4L O2 end of session with SpO2 90%. Pt demonstrated increased weakness and LOB requiring assistance to recover. Pt will need 24 hour assist/supervision upon d/c.     Follow Up Recommendations  Home health PT;Supervision/Assistance - 24 hour     Equipment Recommendations  None recommended by PT    Recommendations for Other Services       Precautions / Restrictions Precautions Precautions: Fall Restrictions Weight Bearing Restrictions: No    Mobility  Bed Mobility Overal bed mobility: Needs Assistance Bed Mobility: Supine to Sit     Supine to sit: HOB elevated;Min assist     General bed mobility comments: cues for sequencing and assist to come up into sitting  Transfers Overall transfer level: Needs assistance Equipment used: Rolling walker  (2 wheeled);1 person hand held assist Transfers: Sit to/from [facility] Sit to Stand: Min assist Stand pivot transfers: Mod assist       General transfer comment: cues for safety and hand placement/technique; assist to stand and for balance upon stand; mod A for stand pivot without AD recliner <> BSC  Ambulation/Gait Ambulation/Gait assistance: Min assist;Mod assist;+2 safety/equipment Ambulation Distance (Feet): 10 Feet Assistive device: Rolling walker (2 wheeled) Gait Pattern/deviations: Shuffle;Decreased step length - right;Decreased step length - left;Trunk flexed;Narrow base of support Gait velocity: slowed   General Gait Details: assist for balance with 1 LOB requiring assist to recover and regain balance; cues for posture, breathing technique, and increased bilat step length   Stairs            Wheelchair Mobility    Modified Rankin (Stroke Patients Only)       Balance Overall balance assessment: Needs assistance Sitting-balance support: Bilateral upper extremity supported;No upper extremity supported Sitting balance-Leahy Scale: Fair     Standing balance support: Bilateral upper extremity supported Standing balance-Leahy Scale: Poor Standing balance comment: Reliant on external assistance or UE support.                             Cognition Arousal/Alertness: Awake/alert Behavior During Therapy: Flat affect Overall Cognitive Status: Impaired/Different from baseline Area of Impairment: Following commands;Attention;Safety/judgement;Problem solving                   Current Attention Level: Selective   Following Commands: Follows one step commands with increased time  Safety/Judgement: Decreased awareness of deficits;Decreased awareness of safety Awareness: Emergent Problem Solving: Slow processing;Decreased initiation;Requires verbal cues;Requires tactile cues General Comments: anxiety limits pt's mobility       Exercises      General Comments General comments (skin integrity, edema, etc.): SpO2 85-95% on 3-5L O2 via [place] throughout session; pt resting end of session with 4L O2 via [place] with SpO2 >90%      Pertinent Vitals/Pain Pain Assessment: No/denies pain    Home Living                      Prior Function            PT Goals (current goals can now be found in the care plan section) Acute Rehab PT Goals PT Goal Formulation: With patient Time For Goal Achievement: 01/20/17 Potential to Achieve Goals: Fair Progress towards PT goals: Progressing toward goals    Frequency    Min 3X/week      PT Plan Current plan remains appropriate    Co-evaluation PT/OT/SLP Co-Evaluation/Treatment: Yes Reason for Co-Treatment: To address functional/ADL transfers;For patient/therapist safety PT goals addressed during session: Mobility/safety with mobility        AM-PAC PT "6 Clicks" Daily Activity  Outcome Measure  Difficulty turning over in bed (including adjusting bedclothes, sheets and blankets)?: A Lot Difficulty moving from lying on back to sitting on the side of the bed? : Unable Difficulty sitting down on and standing up from a chair with arms (e.g., wheelchair, bedside commode, etc,.)?: Unable Help needed moving to and from a bed to chair (including a wheelchair)?: A Lot Help needed walking in hospital room?: A Lot Help needed climbing 3-5 steps with a railing? : A Lot 6 Click Score: 10    End of Session Equipment Utilized During Treatment: Gait belt;Oxygen Activity Tolerance: Patient tolerated treatment well Patient left: with call bell/phone within reach;in chair;with chair alarm set Nurse Communication: Mobility status PT Visit Diagnosis: Unsteadiness on feet (R26.81);Other abnormalities of gait and mobility (R26.89);Repeated falls (R29.6);Muscle weakness (generalized) (M62.81);History of falling (Z91.81);Difficulty in walking, not elsewhere classified (R26.2)      Time: 6213-0865 PT Time Calculation (min) (ACUTE ONLY): 42 min  Charges:  $Gait Training: 8-22 mins $Therapeutic Activity: 8-22 mins                    G Codes:       Earney Navy, PTA Pager: [phone]     Darliss Cheney 01/18/2017, 10:07 AM

## 2017-01-19 DIAGNOSIS — N189 Chronic kidney disease, unspecified: Secondary | ICD-10-CM

## 2017-01-19 DIAGNOSIS — I5041 Acute combined systolic (congestive) and diastolic (congestive) heart failure: Secondary | ICD-10-CM

## 2017-01-19 LAB — CBC
HEMATOCRIT: 26.3 % — AB (ref 36.0–46.0)
HEMOGLOBIN: 8 g/dL — AB (ref 12.0–15.0)
MCH: 28.8 pg (ref 26.0–34.0)
MCHC: 30.4 g/dL (ref 30.0–36.0)
MCV: 94.6 fL (ref 78.0–100.0)
Platelets: 269 10*3/uL (ref 150–400)
RBC: 2.78 MIL/uL — ABNORMAL LOW (ref 3.87–5.11)
RDW: 15.8 % — ABNORMAL HIGH (ref 11.5–15.5)
WBC: 10 10*3/uL (ref 4.0–10.5)

## 2017-01-19 LAB — BASIC METABOLIC PANEL
ANION GAP: 11 (ref 5–15)
BUN: 66 mg/dL — ABNORMAL HIGH (ref 6–20)
CHLORIDE: 89 mmol/L — AB (ref 101–111)
CO2: 31 mmol/L (ref 22–32)
Calcium: 8.7 mg/dL — ABNORMAL LOW (ref 8.9–10.3)
Creatinine, Ser: 2.94 mg/dL — ABNORMAL HIGH (ref 0.44–1.00)
GFR calc non Af Amer: 14 mL/min — ABNORMAL LOW (ref >60)
GFR, EST AFRICAN AMERICAN: 16 mL/min — AB (ref >60)
Glucose, Bld: 98 mg/dL (ref 65–99)
POTASSIUM: 3.9 mmol/L (ref 3.5–5.1)
Sodium: 131 mmol/L — ABNORMAL LOW (ref 135–145)

## 2017-01-19 LAB — MAGNESIUM: Magnesium: 2.2 mg/dL (ref 1.7–2.4)

## 2017-01-19 LAB — PROTIME-INR
INR: 3.51
Prothrombin Time: 35 seconds — ABNORMAL HIGH (ref 11.4–15.2)

## 2017-01-19 MED ORDER — FUROSEMIDE 80 MG PO TABS
160.0000 mg | ORAL_TABLET | Freq: Two times a day (BID) | ORAL | Status: DC
  Administered 2017-01-19 – 2017-01-20 (×2): 160 mg via ORAL
  Filled 2017-01-19 (×2): qty 2

## 2017-01-19 NOTE — Progress Notes (Signed)
Progress Note  Patient Name: Valerie Terrell Date of Encounter: 01/19/2017  Primary Cardiologist: Gwenlyn Found  Subjective   SOB, no CP  Inpatient Medications    Scheduled Meds: . acetaminophen  1,000 mg Oral QHS  . amiodarone  200 mg Oral Daily  . amLODipine  5 mg Oral Daily  . calcium-vitamin D  1 tablet Oral BID  . carvedilol  25 mg Oral BID WC  . diclofenac  1 patch Transdermal BID  . escitalopram  5 mg Oral QHS  . furosemide  60 mg Intravenous Q12H  . hydrALAZINE  100 mg Oral TID  . isosorbide mononitrate  60 mg Oral Daily  . levofloxacin  500 mg Oral Q48H  . mirtazapine  7.5 mg Oral QHS  . multivitamin with minerals  1 tablet Oral Once per day on Mon Wed Fri  . polyethylene glycol  17 g Oral Daily  . pravastatin  80 mg Oral q1800  . sodium chloride flush  3 mL Intravenous Q12H  . vitamin C  500 mg Oral Daily  . Warfarin - Pharmacist Dosing Inpatient   Does not apply q1800   Continuous Infusions: . sodium chloride    . ferumoxytol Stopped (01/12/17 1015)   PRN Meds: sodium chloride, acetaminophen, albuterol, bisacodyl, hydrALAZINE, magnesium hydroxide, morphine CONCENTRATE, ondansetron (ZOFRAN) IV, sodium chloride flush, sodium phosphate   Vital Signs    Vitals:   01/18/17 1230 01/18/17 1945 01/19/17 0500 01/19/17 0846  BP: (!) 144/58 123/63 (!) 144/70 (!) 164/56  Pulse: (!) 59 (!) 59 60 61  Resp: 18 18    Temp: 98 F (36.7 C) 98 F (36.7 C) 98 F (36.7 C)   TempSrc: Oral Oral Oral   SpO2: 98% 99%    Weight:   109 lb 5.6 oz (49.6 kg)   Height:        Intake/Output Summary (Last 24 hours) at 01/19/17 1030 Last data filed at 01/19/17 0900  Gross per 24 hour  Intake              120 ml  Output             1050 ml  Net             -930 ml   Filed Weights   01/17/17 0524 01/18/17 0353 01/19/17 0500  Weight: 107 lb (48.5 kg) 110 lb (49.9 kg) 109 lb 5.6 oz (49.6 kg)    Telemetry    paced - Personally Reviewed  ECG    paced - Personally  Reviewed  Physical Exam   GEN: No acute distress.  elderly Neck: No JVD Cardiac: RRR, no murmurs, rubs, or gallops.  Respiratory: mild wheeze noted bilat with decreased bases.  GI: Soft, nontender, non-distended  MS: No edema; No deformity. Neuro:  Nonfocal  Psych: Normal affect   Labs    Chemistry Recent Labs Lab 01/13/17 0524 01/14/17 0439 01/15/17 0337 01/16/17 0632 01/17/17 0759 01/19/17 0640  NA 135 137 137 137 132* 131*  K 4.2 4.5 4.0 4.2 4.2 3.9  CL 93* 96* 96* 94* 91* 89*  CO2 32 33* 32 30 29 31   GLUCOSE 107* 128* 114* 106* 106* 98  BUN 53* 56* 56* 56* 58* 66*  CREATININE 2.27* 2.28* 2.23* 2.33* 2.47* 2.94*  CALCIUM 9.2 9.7 9.5 9.6 9.4 8.7*  PROT 6.4*  --   --   --  6.4*  --   ALBUMIN 3.2* 3.2* 3.1*  --  3.1*  --   AST  20  --   --   --  23  --   ALT 14  --   --   --  18  --   ALKPHOS 51  --   --   --  52  --   BILITOT 0.7  --   --   --  0.4  --   GFRNONAA 19* 19* 19* 18* 17* 14*  GFRAA 22* 22* 23* 21* 20* 16*  ANIONGAP 10 8 9 13 12 11      Hematology Recent Labs Lab 01/15/17 0337 01/17/17 0801 01/19/17 0640  WBC 8.5 11.8* 10.0  RBC 2.91* 2.85* 2.78*  HGB 8.4* 8.3* 8.0*  HCT 27.8* 27.1* 26.3*  MCV 95.5 95.1 94.6  MCH 28.9 29.1 28.8  MCHC 30.2 30.6 30.4  RDW 15.5 15.6* 15.8*  PLT 235 251 269    Cardiac EnzymesNo results for input(s): TROPONINI in the last 168 hours. No results for input(s): TROPIPOC in the last 168 hours.   BNPNo results for input(s): BNP, PROBNP in the last 168 hours.   DDimer No results for input(s): DDIMER in the last 168 hours.   Radiology    No results found.  Cardiac Studies   EF 35-40%  Patient Profile     83 y.o. female with acute on chronic systolic HF, NICM, PAF, Pacer, CKD  Assessment & Plan    Acute on chronic systolic HF  - Creat has jumped to 2.9 from 2.4.   - net out 2.8  - change to PO lasix 160 BID as on at home  - coreg  PAF  - amio now 200 QD  CKD 4  - creat worse, likely related to  continued diuresis.   Pacer  - stable, atrial.   Signed, Candee Furbish, MD  01/19/2017, 10:30 AM

## 2017-01-19 NOTE — Care Management Important Message (Signed)
Important Message  Patient Details  Name: Valerie Terrell MRN: [id number] Date of Birth: 01/20/1933   Medicare Important Message Given:  Yes    Valerie Terrell Montine Circle 01/19/2017, 3:14 PM

## 2017-01-19 NOTE — Progress Notes (Addendum)
ANTICOAGULATION CONSULT NOTE - Follow Up Consult  Pharmacy Consult for Coumadin Indication: atrial fibrillation  Allergies  Allergen Reactions  . Clonidine Derivatives Palpitations and Other (See Comments)    Very sedated  . Codeine Nausea Only  . Nickel Rash  . Sulfa Antibiotics Other (See Comments)    Reaction unknown   Patient Measurements: Height: 4\' 11"  (149.9 cm) Weight: 109 lb 5.6 oz (49.6 kg) IBW/kg (Calculated) : 43.2  Vital Signs: Temp: 98 F (36.7 C) (09/04 0500) Temp Source: Oral (09/04 0500) BP: 144/70 (09/04 0500) Pulse Rate: 60 (09/04 0500)  Labs:  Recent Labs  01/17/17 0759 01/17/17 0801 01/18/17 0430 01/19/17 0640  HGB  --  8.3*  --  8.0*  HCT  --  27.1*  --  26.3*  PLT  --  251  --  269  LABPROT 28.0*  --  30.8* 35.0*  INR 2.64  --  2.98 3.51  CREATININE 2.47*  --   --  2.94*   Estimated Creatinine Clearance: 10.2 mL/min (A) (by C-G formula based on SCr of 2.94 mg/dL (H)).  Assessment:  83 yr old female on Coumadin for afib.  INR supratherapeutic 3.51 after being therapeutic at 2.98 but  yesterday. Warfarin was held yesterday. Her HgB 8.0 down slightly, her platelets are stable and no noted bleeding. The INR increase is likely due to an interaction between warfarin and levafloxacin. The patient has been on levafloxacin since 9/1.   Home Coumadin regimen: 3 mg TTSS, 1.5 mg MWF  Goal of Therapy:  INR 2-3 Monitor platelets by anticoagulation protocol: Yes   Plan:  HOLD warfarin today Daily PT/INR Monitor for s/sx of bleeding  Georga Bora, PharmD Clinical Pharmacist 01/19/2017 8:41 AM

## 2017-01-19 NOTE — Progress Notes (Signed)
Pt slept on and off overnight, vitals stable , no any complain of SOB and chest pain, is in purewick catheter but she missed twice, bed changed, no any other specific complain, will continue to monitor the patient

## 2017-01-19 NOTE — Progress Notes (Signed)
Physical Therapy Treatment Patient Details Name: Valerie Terrell MRN: [id number] DOB: Dec 09, 1932 Today's Date: 01/19/2017    History of Present Illness Pt is an 83 y.o. female who presented to the ED for evaluation of bleeding head wound and R back pain after a fall. In the ED, hypoxic at 88% on her supplemental oxygen, tachypneic, mildly hypertensive, chest x-ray showed nondisplaced right ninth rib fracture without complicating features as well as multiple old right rib fractures with healing. She was briefly placed on nonrebreather mask. Admitted for acute on chronic hypoxic respiratory failure secondary to acute on chronic systolic and diastolic CHF and chest wall splinting related to pain from rib fracture. PMH significant for: chronic combined diastolic and systolic CHF, NICM, stage IV chronic kidney disease, CAD, PAF on warfarin, home oxygen 3-4 L/m, HLD, HTN, and CVA.    PT Comments    Pt agreeable to mobility this session, requesting to attempt BM prior to ambulation.  Majority of session focus on dynamic sitting/standing balance during attempts to void and for pt to self-disimpact.  Pt performs sit<>stand with min assist and cues for hand placement throughout session.  Stand/pivot with min assist initially, fade to mod assist with pt fatigue and increased anxiety.  RN in room at end of session.       Follow Up Recommendations  Home health PT;Supervision/Assistance - 24 hour     Equipment Recommendations       Recommendations for Other Services       Precautions / Restrictions Precautions Precautions: Fall Restrictions Weight Bearing Restrictions: No    Mobility  Bed Mobility Overal bed mobility: Needs Assistance Bed Mobility: Supine to Sit;Sit to Supine     Supine to sit: Supervision;HOB elevated Sit to supine: Max assist (due to pain in rectum)      Transfers Overall transfer level: Needs assistance Equipment used: Rolling walker (2 wheeled) Transfers: Sit to/from  [facility] Sit to Stand: Min assist Stand pivot transfers: Mod assist       General transfer comment: cues for safety and obtaining balance prior to beginning to move  Ambulation/Gait                 Stairs            Wheelchair Mobility    Modified Rankin (Stroke Patients Only)       Balance Overall balance assessment: Needs assistance Sitting-balance support: Bilateral upper extremity supported Sitting balance-Leahy Scale: Fair                                      Cognition Arousal/Alertness: Awake/alert Behavior During Therapy: WFL for tasks assessed/performed Overall Cognitive Status: History of cognitive impairments - at baseline Area of Impairment: Following commands;Safety/judgement;Awareness                                      Exercises      General Comments        Pertinent Vitals/Pain Pain Assessment: Faces Faces Pain Scale: Hurts even more Pain Descriptors / Indicators: Grimacing Pain Intervention(s): Limited activity within patient's tolerance;Monitored during session    Home Living                      Prior Function            PT  Goals (current goals can now be found in the care plan section) Acute Rehab PT Goals PT Goal Formulation: With patient Time For Goal Achievement: 01/20/17 Potential to Achieve Goals: Fair Progress towards PT goals: Not progressing toward goals - comment (limited this session by impaction and rectal pain)    Frequency    Min 3X/week      PT Plan Current plan remains appropriate    Co-evaluation              AM-PAC PT "6 Clicks" Daily Activity  Outcome Measure  Difficulty turning over in bed (including adjusting bedclothes, sheets and blankets)?: A Little Difficulty moving from lying on back to sitting on the side of the bed? : A Little Difficulty sitting down on and standing up from a chair with arms (e.g., wheelchair, bedside  commode, etc,.)?: A Little Help needed moving to and from a bed to chair (including a wheelchair)?: A Little Help needed walking in hospital room?: A Lot Help needed climbing 3-5 steps with a railing? : A Lot 6 Click Score: 16    End of Session Equipment Utilized During Treatment: Oxygen Activity Tolerance: Patient limited by pain Patient left: in bed;with nursing/sitter in room;with family/visitor present Nurse Communication: Other (comment) PT Visit Diagnosis: Unsteadiness on feet (R26.81);Other abnormalities of gait and mobility (R26.89);Repeated falls (R29.6);Muscle weakness (generalized) (M62.81);History of falling (Z91.81);Difficulty in walking, not elsewhere classified (R26.2)     Time: 5456-2563 PT Time Calculation (min) (ACUTE ONLY): 60 min  Charges:  $Therapeutic Activity: 53-67 mins                    G Codes:          Michel Santee, PT, DPT 01/19/2017, 11:49 AM

## 2017-01-19 NOTE — Progress Notes (Signed)
Pt accidentally pulled out IV this am, new orders puts in

## 2017-01-19 NOTE — Progress Notes (Signed)
PROGRESS NOTE    Valerie Terrell  YIA:[id number] DOB: Sep 18, 1932 DOA: 01/10/2017 PCP: Tammi Sou, MD   Brief Narrative: 83 year old female with history of chronic combined diastolic and systolic heart failure, chronic kidney disease, coronary artery disease, paroxysmal 18/ sick sinus syndrome is status post permanent pacemaker on Coumadin, on chronic oxygen at home 3-4 L, dyslipidemia, hypertension presented to ER for the evaluation of bleeding head wound and back pain that occurred after a fall. In the ER patient was hypoxic to 88% on supplemental oxygen but tachypneic, mildly hypertensive. Chest x-ray showed nondisplaced right ninth rib fracture without completing features and multiple old rib fractures with healing. She was treated with IV Lasix for CHF.  Assessment & Plan:   # Acute on chronic hypoxic respiratory failure: In the setting of CHF and may have left lower lobe atelectasis/pneumonia -Chest x-ray showed interstitial pulmonary edema, stable bilateral pleural effusion and possible left lower lobe atelectasis -Currently on baseline o2,  2 L . -Elevated serum creatinine level therefore switched to oral Lasix twice a day at home dose. Continue Levaquin. Cartilage a consult appreciated. Monitor urine output, daily weight.  #Nonischemic cardiomyopathy EF of 35-40% by echo in July.  #Paroxysmal atrial fibrillation: Continue amiodarone and Coumadin. Monitor heart rate and INR. INR is therapeutic.  #Tachycardia bradycardic syndrome status post pacemaker placement in July.  #Acute kidney injury on Chronic kidney disease stage IV: Elevated serum creatinine level likely hemodynamically mediated in the setting of IV Lasix. Changed to oral today. Monitor BMP. Avoid nephrotoxins. Monitor BMP.   #Essential hypertension: Monitor blood pressure. Continue amlodipine, Lasix, Coreg, hydralazine, Imdur  #Undisplaced right ninth rib fracture without complicating feature, multiple old right rib  fractures with healing: Pain management, incentive spirometer and supportive care. PT OT evaluation.  #Generalized anxiety/depression: Started Lexapro 5 mg daily at bedtime and mirtazapine 7.5 mg at bedtime by psychiatrist. Encourage oral intake. Mortise is stable today. Evaluated by psychiatrist.  #Atelectasis versus left lower lobe pneumonia: Continue oral Levaquin. Leukocytosis improving.  DVT prophylaxis: Coumadin Code Status: Full code Family Communication: Patient's daughter at bedside Disposition Plan: Currently admitted, likely discharge to skilled facility versus home with home care in 1-2 days. PT OT evaluation.    Consultants:   Cardiology  Procedures: None Antimicrobials: Levaquin  Subjective: Seen and examined at bedside. Denied shortness of breath, chest pain. Feels weak. No nausea vomiting.  Objective: Vitals:   01/18/17 1945 01/19/17 0500 01/19/17 0846 01/19/17 1315  BP: 123/63 (!) 144/70 (!) 164/56 116/90  Pulse: (!) 59 60 61 61  Resp: 18   20  Temp: 98 F (36.7 C) 98 F (36.7 C)  98.3 F (36.8 C)  TempSrc: Oral Oral  Oral  SpO2: 99%   99%  Weight:  49.6 kg (109 lb 5.6 oz)    Height:        Intake/Output Summary (Last 24 hours) at 01/19/17 1332 Last data filed at 01/19/17 0900  Gross per 24 hour  Intake              120 ml  Output             1050 ml  Net             -930 ml   Filed Weights   01/17/17 0524 01/18/17 0353 01/19/17 0500  Weight: 48.5 kg (107 lb) 49.9 kg (110 lb) 49.6 kg (109 lb 5.6 oz)    Examination:  General exam: Looks better today, not in distress Respiratory system:  Bilateral diffuse coarse crackle, no wheezing Cardiovascular system: Regular rate rhythm, S1-S2 normal. No pedal edema. Gastrointestinal system: Abdomen is nondistended, soft and nontender. Normal bowel sounds heard. Central nervous system: Alert awake and following commands Extremities: Symmetric 5 x 5 power. Skin: No rashes, lesions or ulcers   Data  Reviewed: I have personally reviewed following labs and imaging studies  CBC:  Recent Labs Lab 01/13/17 0524 01/14/17 0439 01/15/17 0337 01/17/17 0801 01/19/17 0640  WBC 11.2* 12.5* 8.5 11.8* 10.0  NEUTROABS 8.8*  --  6.7 9.8*  --   HGB 8.3* 8.4* 8.4* 8.3* 8.0*  HCT 27.4* 27.2* 27.8* 27.1* 26.3*  MCV 95.8 96.5 95.5 95.1 94.6  PLT 209 228 235 251 010   Basic Metabolic Panel:  Recent Labs Lab 01/14/17 0439 01/15/17 0337 01/16/17 0632 01/17/17 0759 01/19/17 0640  NA 137 137 137 132* 131*  K 4.5 4.0 4.2 4.2 3.9  CL 96* 96* 94* 91* 89*  CO2 33* 32 30 29 31   GLUCOSE 128* 114* 106* 106* 98  BUN 56* 56* 56* 58* 66*  CREATININE 2.28* 2.23* 2.33* 2.47* 2.94*  CALCIUM 9.7 9.5 9.6 9.4 8.7*  MG  --   --   --   --  2.2  PHOS 4.0 3.1  --   --   --    GFR: Estimated Creatinine Clearance: 10.2 mL/min (A) (by C-G formula based on SCr of 2.94 mg/dL (H)). Liver Function Tests:  Recent Labs Lab 01/13/17 0524 01/14/17 0439 01/15/17 0337 01/17/17 0759  AST 20  --   --  23  ALT 14  --   --  18  ALKPHOS 51  --   --  52  BILITOT 0.7  --   --  0.4  PROT 6.4*  --   --  6.4*  ALBUMIN 3.2* 3.2* 3.1* 3.1*   No results for input(s): LIPASE, AMYLASE in the last 168 hours. No results for input(s): AMMONIA in the last 168 hours. Coagulation Profile:  Recent Labs Lab 01/16/17 0632 01/17/17 0558 01/17/17 0759 01/18/17 0430 01/19/17 0640  INR 3.19 SPECIMEN CLOTTED 2.64 2.98 3.51   Cardiac Enzymes: No results for input(s): CKTOTAL, CKMB, CKMBINDEX, TROPONINI in the last 168 hours. BNP (last 3 results) No results for input(s): PROBNP in the last 8760 hours. HbA1C: No results for input(s): HGBA1C in the last 72 hours. CBG: No results for input(s): GLUCAP in the last 168 hours. Lipid Profile: No results for input(s): CHOL, HDL, LDLCALC, TRIG, CHOLHDL, LDLDIRECT in the last 72 hours. Thyroid Function Tests: No results for input(s): TSH, T4TOTAL, FREET4, T3FREE, THYROIDAB in the  last 72 hours. Anemia Panel: No results for input(s): VITAMINB12, FOLATE, FERRITIN, TIBC, IRON, RETICCTPCT in the last 72 hours. Sepsis Labs: No results for input(s): PROCALCITON, LATICACIDVEN in the last 168 hours.  Recent Results (from the past 240 hour(s))  Culture, blood (routine x 2) Call MD if unable to obtain prior to antibiotics being given     Status: None   Collection Time: 01/13/17 12:55 PM  Result Value Ref Range Status   Specimen Description BLOOD LEFT HAND  Final   Special Requests IN PEDIATRIC BOTTLE Blood Culture adequate volume  Final   Culture NO GROWTH 5 DAYS  Final   Report Status 01/18/2017 FINAL  Final  Culture, blood (routine x 2) Call MD if unable to obtain prior to antibiotics being given     Status: None   Collection Time: 01/13/17 12:56 PM  Result Value Ref Range  Status   Specimen Description BLOOD LEFT FOREARM  Final   Special Requests IN PEDIATRIC BOTTLE Blood Culture adequate volume  Final   Culture NO GROWTH 5 DAYS  Final   Report Status 01/18/2017 FINAL  Final         Radiology Studies: No results found.      Scheduled Meds: . acetaminophen  1,000 mg Oral QHS  . amiodarone  200 mg Oral Daily  . amLODipine  5 mg Oral Daily  . calcium-vitamin D  1 tablet Oral BID  . carvedilol  25 mg Oral BID WC  . diclofenac  1 patch Transdermal BID  . escitalopram  5 mg Oral QHS  . furosemide  160 mg Oral BID  . hydrALAZINE  100 mg Oral TID  . isosorbide mononitrate  60 mg Oral Daily  . levofloxacin  500 mg Oral Q48H  . mirtazapine  7.5 mg Oral QHS  . multivitamin with minerals  1 tablet Oral Once per day on Mon Wed Fri  . polyethylene glycol  17 g Oral Daily  . pravastatin  80 mg Oral q1800  . sodium chloride flush  3 mL Intravenous Q12H  . vitamin C  500 mg Oral Daily  . Warfarin - Pharmacist Dosing Inpatient   Does not apply q1800   Continuous Infusions: . sodium chloride    . ferumoxytol Stopped (01/12/17 1015)     LOS: 8 days    Romina Divirgilio  Tanna Furry, MD [facility] Pager [phone]  If 7PM-7AM, please contact night-coverage [email or web address] Password [email or web address] 01/19/2017, 1:32 PM

## 2017-01-20 ENCOUNTER — Ambulatory Visit: Payer: Medicare Other | Admitting: Family Medicine

## 2017-01-20 ENCOUNTER — Other Ambulatory Visit: Payer: Self-pay

## 2017-01-20 ENCOUNTER — Inpatient Hospital Stay (HOSPITAL_COMMUNITY): Payer: Medicare Other

## 2017-01-20 DIAGNOSIS — I10 Essential (primary) hypertension: Secondary | ICD-10-CM

## 2017-01-20 LAB — URINALYSIS, ROUTINE W REFLEX MICROSCOPIC
Bilirubin Urine: NEGATIVE
GLUCOSE, UA: NEGATIVE mg/dL
Hgb urine dipstick: NEGATIVE
Ketones, ur: NEGATIVE mg/dL
Leukocytes, UA: NEGATIVE
Nitrite: NEGATIVE
PROTEIN: 30 mg/dL — AB
Specific Gravity, Urine: 1.012 (ref 1.005–1.030)
Squamous Epithelial / LPF: NONE SEEN
pH: 5 (ref 5.0–8.0)

## 2017-01-20 LAB — BLOOD GAS, ARTERIAL
ACID-BASE EXCESS: 6.8 mmol/L — AB (ref 0.0–2.0)
BICARBONATE: 31.4 mmol/L — AB (ref 20.0–28.0)
Drawn by: 313941
O2 Content: 4 L/min
O2 Saturation: 97.4 %
PH ART: 7.411 (ref 7.350–7.450)
Patient temperature: 98.6
pCO2 arterial: 50.4 mmHg — ABNORMAL HIGH (ref 32.0–48.0)
pO2, Arterial: 84.6 mmHg (ref 83.0–108.0)

## 2017-01-20 LAB — PROTIME-INR
INR: 3.06
PROTHROMBIN TIME: 31.4 s — AB (ref 11.4–15.2)

## 2017-01-20 LAB — BASIC METABOLIC PANEL
ANION GAP: 11 (ref 5–15)
BUN: 66 mg/dL — ABNORMAL HIGH (ref 6–20)
CALCIUM: 8.3 mg/dL — AB (ref 8.9–10.3)
CO2: 28 mmol/L (ref 22–32)
Chloride: 89 mmol/L — ABNORMAL LOW (ref 101–111)
Creatinine, Ser: 2.98 mg/dL — ABNORMAL HIGH (ref 0.44–1.00)
GFR, EST AFRICAN AMERICAN: 16 mL/min — AB (ref >60)
GFR, EST NON AFRICAN AMERICAN: 14 mL/min — AB (ref >60)
Glucose, Bld: 105 mg/dL — ABNORMAL HIGH (ref 65–99)
Potassium: 4 mmol/L (ref 3.5–5.1)
SODIUM: 128 mmol/L — AB (ref 135–145)

## 2017-01-20 MED ORDER — FUROSEMIDE 10 MG/ML IJ SOLN
120.0000 mg | Freq: Three times a day (TID) | INTRAVENOUS | Status: DC
  Administered 2017-01-20: 120 mg via INTRAVENOUS
  Filled 2017-01-20 (×3): qty 12

## 2017-01-20 MED ORDER — FUROSEMIDE 10 MG/ML IJ SOLN
80.0000 mg | Freq: Three times a day (TID) | INTRAMUSCULAR | Status: DC
  Administered 2017-01-20: 80 mg via INTRAVENOUS
  Filled 2017-01-20: qty 8

## 2017-01-20 NOTE — NC FL2 (Signed)
[place] LEVEL OF CARE SCREENING TOOL     IDENTIFICATION  Patient Name: Valerie Terrell Birthdate: 16-Mar-1933 Sex: female Admission Date (Current Location): 01/10/2017  [facility] and [place] Number:  Herbalist and Address:  [facility] [place] [address] [place], [place] [address]      Provider Number: [id number]  Attending Physician Name and Address:  Florencia Reasons, MD  Relative Name and Phone Number:       Current Level of Care: Hospital Recommended Level of Care: [place] Prior Approval Number:    Date Approved/Denied:   PASRR Number: [id number]  Discharge Plan: SNF    Current Diagnoses: Patient Active Problem List   Diagnosis Date Noted  . Fracture of one rib, right side, initial encounter for closed fracture 01/11/2017  . Acute on chronic systolic congestive heart failure ([place])   . Closed fracture of one rib of right side   . Chronic combined systolic and diastolic heart failure ([place]) 01/05/2017  . Palpitations 12/15/2016  . Hypokalemia 12/14/2016  . Leukocytosis 12/14/2016  . Atrial fibrillation ([place]) [I48.91] 12/11/2016  . Long term (current) use of anticoagulants [Z79.01] 12/11/2016  . Tachycardia-bradycardia syndrome ([place]) - s/p PPM 12/07/2016  . Nonischemic cardiomyopathy ([place]): EF 35-40% 12/07/2016  . Acute combined systolic and diastolic heart failure ([place]) - possibly related to A. fib 12/07/2016  . Cardiac pacemaker   . Acute on chronic respiratory failure with hypoxia ([place])   . Acute pulmonary edema with congestive heart failure ([place]) 11/30/2016  . Elevated troponin level 11/30/2016  . Anemia 11/30/2016  . Chronic midline thoracic back pain 09/15/2016  . Acute CVA (cerebrovascular accident) ([place]) 09/15/2016  . Hyperlipidemia   . Nausea & vomiting 08/05/2016  . Dehydration 08/05/2016  . Acute kidney injury superimposed on CKD ([place]) 08/05/2016  . Osteoporosis 07/28/2016  . Closed compression  fracture of thoracic vertebra ([place]) 07/23/2016  . Thoracic compression fracture ([place]) 07/22/2016  . Acute renal failure superimposed on chronic kidney disease ([place]) 07/11/2016  . Flank pain 07/11/2016  . Hypoalbuminemia 07/11/2016  . Renal artery stenosis ([place]) 06/24/2016  . Nondisplaced fracture of fifth metatarsal bone, left foot, initial encounter for closed fracture 06/10/2016  . Rib contusion, left, initial encounter 06/10/2016  . Single kidney 03/13/2016  . Chest pain   . Dyslipidemia   . PAF (paroxysmal atrial fibrillation) ([place]) 01/28/2016  . Essential hypertension 01/28/2016  . Cardioembolic stroke ([employer]) 26/94/8546  . 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 Personal history of subarachnoid hemorrhage 01/28/2016  . CKD (chronic kidney disease), stage IV ([place]) 01/28/2016    Orientation RESPIRATION BLADDER Height & Weight     Self, Time, Situation, Place  O2 (Nasal Canula 4 L) Continent Weight: 118 lb (53.5 kg) (scale b) Height:  4\' 11"  (149.9 cm)  BEHAVIORAL SYMPTOMS/MOOD NEUROLOGICAL BOWEL NUTRITION STATUS   (Flat affect)  (None) Continent Diet (Heart healthy. Fluid restriction 1500 mL.)  AMBULATORY STATUS COMMUNICATION OF NEEDS Skin   Extensive Assist Verbally Skin abrasions, Bruising                       Personal Care Assistance Level of Assistance  Bathing, Feeding, Dressing Bathing Assistance: Maximum assistance Feeding assistance: Limited assistance Dressing Assistance: Maximum assistance     Functional Limitations Info  Sight, Hearing, Speech Sight Info: Adequate Hearing Info: Adequate Speech Info: Adequate (Speaks very quietly)    [place]  PT (By licensed  PT), Blood pressure, OT (By licensed OT)     PT Frequency: 5 x week OT Frequency: 5 x week            Contractures Contractures Info: Not present    Additional Factors Info  Code Status, Allergies Code Status Info: Full Allergies Info: Clonidine  Derivatives, Codeine, Nickel, Sulfa Antibiotics           Current Medications (01/20/2017):  This is the current hospital active medication list Current Facility-Administered Medications  Medication Dose Route Frequency Provider Last Rate Last Dose  . 0.9 %  sodium chloride infusion  250 mL Intravenous PRN Opyd, Ilene Qua, MD      . acetaminophen (TYLENOL) tablet 1,000 mg  1,000 mg Oral QHS Nita Sells, MD   1,000 mg at 01/19/17 2103  . acetaminophen (TYLENOL) tablet 650 mg  650 mg Oral Q4H PRN Opyd, Ilene Qua, MD   650 mg at 01/20/17 1003  . albuterol (PROVENTIL) (2.5 MG/3ML) 0.083% nebulizer solution 2.5 mg  2.5 mg Nebulization Q6H PRN Opyd, Ilene Qua, MD   2.5 mg at 01/17/17 0528  . amiodarone (PACERONE) tablet 200 mg  200 mg Oral Daily Satira Sark, MD   200 mg at 01/20/17 1012  . amLODipine (NORVASC) tablet 5 mg  5 mg Oral Daily Opyd, Ilene Qua, MD   5 mg at 01/20/17 1012  . bisacodyl (DULCOLAX) suppository 10 mg  10 mg Rectal Daily PRN Rosita Fire, MD   10 mg at 01/18/17 1819  . calcium-vitamin D (OSCAL WITH D) 500-200 MG-UNIT per tablet 1 tablet  1 tablet Oral BID Modena Jansky, MD   1 tablet at 01/20/17 1012  . carvedilol (COREG) tablet 25 mg  25 mg Oral BID WC Opyd, Ilene Qua, MD   25 mg at 01/20/17 1012  . diclofenac (FLECTOR) 1.3 % 1 patch  1 patch Transdermal BID Nita Sells, MD   1 patch at 01/20/17 1006  . escitalopram (LEXAPRO) tablet 5 mg  5 mg Oral QHS Ambrose Finland, MD   5 mg at 01/19/17 2104  . furosemide (LASIX) tablet 160 mg  160 mg Oral BID Jerline Pain, MD   160 mg at 01/20/17 1011  . hydrALAZINE (APRESOLINE) injection 10 mg  10 mg Intravenous Q4H PRN Vianne Bulls, MD   10 mg at 01/15/17 0743  . hydrALAZINE (APRESOLINE) tablet 100 mg  100 mg Oral TID Vianne Bulls, MD   100 mg at 01/20/17 1013  . isosorbide mononitrate (IMDUR) 24 hr tablet 60 mg  60 mg Oral Daily Opyd, Ilene Qua, MD   60 mg at 01/20/17 1012  .  levofloxacin (LEVAQUIN) tablet 500 mg  500 mg Oral Q48H Nita Sells, MD   500 mg at 01/18/17 1544  . magnesium hydroxide (MILK OF MAGNESIA) suspension 30 mL  30 mL Oral Daily PRN Opyd, Ilene Qua, MD      . mirtazapine (REMERON) tablet 7.5 mg  7.5 mg Oral QHS Ambrose Finland, MD   7.5 mg at 01/19/17 2103  . multivitamin with minerals tablet 1 tablet  1 tablet Oral Once per day on Mon Wed Fri Opyd, Timothy S, MD   1 tablet at 01/20/17 1019  . ondansetron (ZOFRAN) injection 4 mg  4 mg Intravenous Q6H PRN Opyd, Ilene Qua, MD   4 mg at 01/17/17 0735  . polyethylene glycol (MIRALAX / GLYCOLAX) packet 17 g  17 g Oral Daily Nita Sells, MD   17 g at  01/20/17 1013  . pravastatin (PRAVACHOL) tablet 80 mg  80 mg Oral q1800 Nita Sells, MD   80 mg at 01/19/17 1750  . sodium chloride flush (NS) 0.9 % injection 3 mL  3 mL Intravenous Q12H Opyd, Ilene Qua, MD   3 mL at 01/20/17 1013  . sodium chloride flush (NS) 0.9 % injection 3 mL  3 mL Intravenous PRN Opyd, Ilene Qua, MD      . sodium phosphate (FLEET) 7-19 GM/118ML enema 1 enema  1 enema Rectal Daily PRN Nita Sells, MD   1 enema at 01/19/17 1141  . vitamin C (ASCORBIC ACID) tablet 500 mg  500 mg Oral Daily Opyd, Ilene Qua, MD   500 mg at 01/20/17 1012  . Warfarin - Pharmacist Dosing Inpatient   Does not apply [place], [facility]   1 each at 01/19/17 1800     Discharge Medications: Please see discharge summary for a list of discharge medications.  Relevant Imaging Results:  Relevant Lab Results:   Additional Information SS#: [id number]  Candie Chroman, LCSW

## 2017-01-20 NOTE — Progress Notes (Signed)
Patient was scratching her forehed where her abrasion is located which caused abrasion to bleed. Bleeding was stopped and dressing was changed. No other issues or complaints. Patient slept during the night. Will continue to monitor.  Mandel Seiden, RN

## 2017-01-20 NOTE — Clinical Social Work Placement (Signed)
   CLINICAL SOCIAL WORK PLACEMENT  NOTE  Date:  01/20/2017  Patient Details  Name: Valerie Terrell MRN: [id number] Date of Birth: May 09, 1933  Clinical Social Work is seeking post-discharge placement for this patient at the [place] level of care (*CSW will initial, date and re-position this form in  chart as items are completed):  Yes   Patient/family provided with [place] Work Department's list of facilities offering this level of care within the geographic area requested by the patient (or if unable, by the patient's family).  Yes   Patient/family informed of their freedom to choose among providers that offer the needed level of care, that participate in Medicare, Medicaid or managed care program needed by the patient, have an available bed and are willing to accept the patient.  Yes   Patient/family informed of [place]'s ownership interest in [facility] and [facility], as well as of the fact that they are under no obligation to receive care at these facilities.  PASRR submitted to EDS on 01/20/17     PASRR number received on 01/20/17     Existing PASRR number confirmed on       FL2 transmitted to all facilities in geographic area requested by pt/family on 01/20/17     FL2 transmitted to all facilities within larger geographic area on       Patient informed that his/her managed care company has contracts with or will negotiate with certain facilities, including the following:            Patient/family informed of bed offers received.  Patient chooses bed at       Physician recommends and patient chooses bed at      Patient to be transferred to   on  .  Patient to be transferred to facility by       Patient family notified on   of transfer.  Name of family member notified:        PHYSICIAN Please sign FL2     Additional Comment:    _______________________________________________ Candie Chroman, LCSW 01/20/2017, 12:03  PM

## 2017-01-20 NOTE — Progress Notes (Signed)
PROGRESS NOTE  See Beharry DTO:[id number] DOB: 1932/08/31 DOA: 01/10/2017 PCP: Tammi Sou, MD  HPI/Recap of past 24 hours:  Per RN Patient refused purewick , urine output was not accurately documented She is really Drowsy this am, she drift back to sleep during conversation, though oriented x3 Daughter at bedside  Assessment/Plan: Principal Problem:   Acute on chronic respiratory failure with hypoxia ([place]) Active Problems:   PAF (paroxysmal atrial fibrillation) ([place])   Essential hypertension   CKD (chronic kidney disease), stage IV ([place])   Acute combined systolic and diastolic heart failure ([place]) - possibly related to A. fib   Fracture of one rib, right side, initial encounter for closed fracture  Drowsiness/encephalopathy? Patient is very drowsy this am, she falls back to sleep during conversation, but oriented x3,  No narcotic or sedative last night or this am Drowsiness likely multifactorial including worsening chf with poor brain perfusion, hypoxia, hyponatremia, worsening of bun/cr, delirium from FTT, prolonged hospitalization could also contribute cxr with worsening of chf, stat ABG co2 upper normal limit with normal PH. Case discussed with daughter , cardiology and nephrology, consider CT head if not improving, patient is full code, may need stepdown if not improving   Acute on chronic hypoxic respiratory failure (home o2 3-4liters at baseline): - Chest x-ray on admission showed interstitial pulmonary edema, stable bilateral pleural effusion and possible left lower lobe atelectasis  -treating chf (cxr with Atelectasis versus left lower lobe pneumonia on admission: she received oral Levaquin x 8days. No fever. Continue to monitor)  Acute on chronic systolic chf: -Nonischemic cardiomyopathy (no regional wall motion on echo, negative stress test) EF of 35-40% by echo in July. -chf Presumed from afib -she appear to have increased oxygen requirement and become  more drowsy this am on 9/5, suspect worsening of chf, will get cxr, abg -case discussed with cardiology today, she is put back to iv lasix,  -cardiology input appreciated  Hyponatremia: from fluids overload?  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  Coumadin per pharmacy  Cardiology following  Acute kidney injury on Chronic kidney disease stage IV:  -History of a nonfunctioning left kidney with mild right renal artery stenosis demonstrated by duplex ultrasound October 2017. - bun/cr continue to trend up, patient become more drowsy -Case discussed with nephrology Dr Florene Glen, will follow nephrology recommendation - Avoid nephrotoxins. Monitor BMP.   Essential hypertension: currently on  amlodipine, Lasix, Coreg, hydralazine, Imdur  Undisplaced right ninth rib fracture without complicating feature, multiple old right rib fractures with healing: Pain management, incentive spirometer and supportive care. PT OT evaluation.  Generalized anxiety/depression:  Started Lexapro 5 mg daily at bedtime and mirtazapine 7.5 mg at bedtime by psychiatrist. Encourage oral intake. Mortise is stable today. Evaluated by psychiatrist.  FTT: this is her 5 hospitalizations this year, daughter report 40pounds of weight loss this year. Will get nutrition/PT/OT,  Condition not improving, may need SNF ( significant care giver exhaustion as well)  Body mass index is 23.83 kg/m.   DVT prophylaxis: Coumadin Code Status: Full code Family Communication: Patient's daughter at bedside Disposition Plan: not ready for discharge, may need SNF at discharge .    Consultants:   Cardiology   Nephrology  psychiatry  Procedures: None Antimicrobials: Levaquin from  admission to 9/5.    Objective:  BP (!) 130/53   Pulse 60   Temp 98.2 F (36.8 C) (Oral)   Resp 20   Ht 4\' 11"  (1.499 m)   Wt 53.5 kg (118 lb) Comment: scale b  SpO2 100%   BMI 23.83 kg/m   Intake/Output Summary (Last 24 hours) at 01/20/17 1207 Last data filed at 01/20/17 0600  Gross per 24 hour  Intake              290 ml  Output              300 ml  Net              -10 ml   Filed Weights   01/18/17 0353 01/19/17 0500 01/20/17 0542  Weight: 49.9 kg (110 lb) 49.6 kg (109 lb 5.6 oz) 53.5 kg (118 lb)    Exam: Patient is examined daily including today on 01/20/2017, exams remain the same as of yesterday except that has changed    General:  Frail, very drowsy, oriented x3  Cardiovascular: paced rhythm  Respiratory: poor respiratory effort, crackles at basis, no wheezing, no rhonchi  Abdomen: Soft/ND/NT, positive BS  Musculoskeletal: No Edema  Neuro: drowsy but oriented   Data Reviewed: Basic Metabolic Panel:  Recent Labs Lab 01/14/17 0439 01/15/17 0337 01/16/17 0632 01/17/17 0759 01/19/17 0640 01/20/17 0249  NA 137 137 137 132* 131* 128*  K 4.5 4.0 4.2 4.2 3.9 4.0  CL 96* 96* 94* 91* 89* 89*  CO2 33* 32 30 29 31 28   GLUCOSE 128* 114* 106* 106* 98 105*  BUN 56* 56* 56* 58* 66* 66*  CREATININE 2.28* 2.23* 2.33* 2.47* 2.94* 2.98*  CALCIUM 9.7 9.5 9.6 9.4 8.7* 8.3*  MG  --   --   --   --  2.2  --   PHOS 4.0 3.1  --   --   --   --    Liver Function Tests:  Recent Labs Lab 01/14/17 0439 01/15/17 0337 01/17/17 0759  AST  --   --  23  ALT  --   --  18  ALKPHOS  --   --  52  BILITOT  --   --  0.4  PROT  --   --  6.4*  ALBUMIN 3.2* 3.1* 3.1*   No results for input(s): LIPASE, AMYLASE in the last 168 hours. No results for input(s): AMMONIA in the last 168 hours. CBC:  Recent Labs Lab 01/14/17 0439 01/15/17 0337 01/17/17 0801 01/19/17 0640  WBC 12.5* 8.5 11.8* 10.0  NEUTROABS  --  6.7 9.8*  --   HGB 8.4* 8.4* 8.3* 8.0*  HCT 27.2* 27.8* 27.1*  26.3*  MCV 96.5 95.5 95.1 94.6  PLT 228 235 251 269   Cardiac Enzymes:   No results for input(s): CKTOTAL, CKMB, CKMBINDEX, TROPONINI in the last 168 hours. BNP (last 3 results)  Recent Labs  11/30/16 1331 12/14/16 2026 01/10/17 2203  BNP >4,500.0* 919.4* 1,996.8*    ProBNP (last 3 results) No results for input(s): PROBNP in the last 8760 hours.  CBG: No results for input(s): GLUCAP in the last 168 hours.  Recent Results (from the past 240 hour(s))  Culture, blood (routine x 2) Call MD if unable to obtain prior to antibiotics being given     Status: None   Collection Time: 01/13/17 12:55 PM  Result Value Ref Range Status   Specimen Description BLOOD LEFT HAND  Final   Special Requests IN PEDIATRIC BOTTLE Blood Culture adequate  volume  Final   Culture NO GROWTH 5 DAYS  Final   Report Status 01/18/2017 FINAL  Final  Culture, blood (routine x 2) Call MD if unable to obtain prior to antibiotics being given     Status: None   Collection Time: 01/13/17 12:56 PM  Result Value Ref Range Status   Specimen Description BLOOD LEFT FOREARM  Final   Special Requests IN PEDIATRIC BOTTLE Blood Culture adequate volume  Final   Culture NO GROWTH 5 DAYS  Final   Report Status 01/18/2017 FINAL  Final     Studies: No results found.  Scheduled Meds: . acetaminophen  1,000 mg Oral QHS  . amiodarone  200 mg Oral Daily  . amLODipine  5 mg Oral Daily  . calcium-vitamin D  1 tablet Oral BID  . carvedilol  25 mg Oral BID WC  . diclofenac  1 patch Transdermal BID  . escitalopram  5 mg Oral QHS  . furosemide  160 mg Oral BID  . hydrALAZINE  100 mg Oral TID  . isosorbide mononitrate  60 mg Oral Daily  . levofloxacin  500 mg Oral Q48H  . mirtazapine  7.5 mg Oral QHS  . multivitamin with minerals  1 tablet Oral Once per day on Mon Wed Fri  . polyethylene glycol  17 g Oral Daily  . pravastatin  80 mg Oral q1800  . sodium chloride flush  3 mL Intravenous Q12H  . vitamin C  500 mg Oral Daily    . Warfarin - Pharmacist Dosing Inpatient   Does not apply q1800    Continuous Infusions: . sodium chloride       Time spent: 35 mins I have personally reviewed and interpreted on  01/20/2017 daily labs, tele strips, imagings as discussed above under data review session and assessment and plans.  I reviewed all nursing notes, pharmacy notes, consultant notes,  vitals, pertinent old records  I have discussed plan of care as described above with RN , patient and family, cardiology and nephrology on 01/20/2017   Bodhi Stenglein MD, PhD  [facility] Pager [phone]. If 7PM-7AM, please contact night-coverage at [email or web address], password [facility] 01/20/2017, 12:07 PM  LOS: 9 days

## 2017-01-20 NOTE — Progress Notes (Signed)
Progress Note  Patient Name: Valerie Terrell Date of Encounter: 01/20/2017  Primary Cardiologist: Gwenlyn Found  Subjective   Pt states she can't remember if she slept well last night, but she can't rest flat. She is requiring more supplemental O2 and rib pain is interfering with good pulmonary toilet.   Inpatient Medications    Scheduled Meds: . acetaminophen  1,000 mg Oral QHS  . amiodarone  200 mg Oral Daily  . amLODipine  5 mg Oral Daily  . calcium-vitamin D  1 tablet Oral BID  . carvedilol  25 mg Oral BID WC  . diclofenac  1 patch Transdermal BID  . escitalopram  5 mg Oral QHS  . furosemide  160 mg Oral BID  . hydrALAZINE  100 mg Oral TID  . isosorbide mononitrate  60 mg Oral Daily  . levofloxacin  500 mg Oral Q48H  . mirtazapine  7.5 mg Oral QHS  . multivitamin with minerals  1 tablet Oral Once per day on Mon Wed Fri  . polyethylene glycol  17 g Oral Daily  . pravastatin  80 mg Oral q1800  . sodium chloride flush  3 mL Intravenous Q12H  . vitamin C  500 mg Oral Daily  . Warfarin - Pharmacist Dosing Inpatient   Does not apply q1800   Continuous Infusions: . sodium chloride     PRN Meds: sodium chloride, acetaminophen, albuterol, bisacodyl, hydrALAZINE, magnesium hydroxide, ondansetron (ZOFRAN) IV, sodium chloride flush, sodium phosphate   Vital Signs    Vitals:   01/19/17 2057 01/20/17 0300 01/20/17 0542 01/20/17 1007  BP: (!) 128/56 (!) 158/56 (!) 153/52 (!) 130/53  Pulse:  60 60 60  Resp:  20 20   Temp:  97.7 F (36.5 C) 98.2 F (36.8 C)   TempSrc:  Oral Oral   SpO2:  98% 96% 100%  Weight:   118 lb (53.5 kg)   Height:        Intake/Output Summary (Last 24 hours) at 01/20/17 1012 Last data filed at 01/20/17 0600  Gross per 24 hour  Intake              290 ml  Output              300 ml  Net              -10 ml   Filed Weights   01/18/17 0353 01/19/17 0500 01/20/17 0542  Weight: 110 lb (49.9 kg) 109 lb 5.6 oz (49.6 kg) 118 lb (53.5 kg)     Physical  Exam   General: Well developed, well nourished, female appearing in no acute distress. Head: Normocephalic, atraumatic.  Neck: Supple without bruits, no JVD, exam difficult. Lungs:  Resp regular and unlabored, CTA - exam difficult secondary to rib fractures Heart: RRR, S1, S2, no S3, S4, or murmur; no rub. Abdomen: Soft, non-tender, non-distended with normoactive bowel sounds. No hepatomegaly. No rebound/guarding. No obvious abdominal masses. Extremities: No clubbing, cyanosis, B LE nonpitting edema. Distal pedal pulses are 1+ bilaterally. Neuro: Alert and oriented X 3. Moves all extremities spontaneously. Psych: Normal affect.  Labs    Chemistry Recent Labs Lab 01/14/17 [phone] 01/15/17 0337  01/17/17 0759 01/19/17 0640 01/20/17 0249  NA 137 137  < > 132* 131* 128*  K 4.5 4.0  < > 4.2 3.9 4.0  CL 96* 96*  < > 91* 89* 89*  CO2 33* 32  < > 29 31 28   GLUCOSE 128* 114*  < > 106*  98 105*  BUN 56* 56*  < > 58* 66* 66*  CREATININE 2.28* 2.23*  < > 2.47* 2.94* 2.98*  CALCIUM 9.7 9.5  < > 9.4 8.7* 8.3*  PROT  --   --   --  6.4*  --   --   ALBUMIN 3.2* 3.1*  --  3.1*  --   --   AST  --   --   --  23  --   --   ALT  --   --   --  18  --   --   ALKPHOS  --   --   --  52  --   --   BILITOT  --   --   --  0.4  --   --   GFRNONAA 19* 19*  < > 17* 14* 14*  GFRAA 22* 23*  < > 20* 16* 16*  ANIONGAP 8 9  < > 12 11 11   < > = values in this interval not displayed.   Hematology Recent Labs Lab 01/15/17 0337 01/17/17 0801 01/19/17 0640  WBC 8.5 11.8* 10.0  RBC 2.91* 2.85* 2.78*  HGB 8.4* 8.3* 8.0*  HCT 27.8* 27.1* 26.3*  MCV 95.5 95.1 94.6  MCH 28.9 29.1 28.8  MCHC 30.2 30.6 30.4  RDW 15.5 15.6* 15.8*  PLT 235 251 269    Cardiac EnzymesNo results for input(s): TROPONINI in the last 168 hours. No results for input(s): TROPIPOC in the last 168 hours.   BNPNo results for input(s): BNP, PROBNP in the last 168 hours.   DDimer No results for input(s): DDIMER in the last 168 hours.    Radiology    No results found.   Telemetry    Atrial pacing - Personally Reviewed  ECG    No new tracings - Personally Reviewed   Cardiac Studies   Echocardiogram 12/02/16: Study Conclusions - Left ventricle: The cavity size was normal. There was moderate   concentric hypertrophy. Systolic function was moderately reduced.   The estimated ejection fraction was in the range of 35% to 40%.   Wall motion was normal; there were no regional wall motion   abnormalities. The study was not technically sufficient to allow   evaluation of LV diastolic dysfunction due to atrial   fibrillation. - Aortic valve: Valve mobility was restricted. Transvalvular   velocity was within the normal range. There was no stenosis.   There was no regurgitation. - Mitral valve: There was mild regurgitation. - Left atrium: The atrium was mildly dilated. - Right ventricle: The cavity size was moderately dilated. Wall   thickness was normal. Systolic function was moderately reduced. - Tricuspid valve: There was mild regurgitation. - Pulmonary arteries: Systolic pressure was moderately increased.   PA peak pressure: 47 mm Hg (S). - Inferior vena cava: The vessel was normal in size. - Pericardium, extracardiac: A trivial pericardial effusion was   identified posterior to the heart. Features were not consistent   with tamponade physiology.  Impressions: - When compared to the prior study from 07/14/2016 LVEF has   decreased from 60-65% to 35-40%. RV is moderately dilated with   moderately decreased RVEF. There is RV strain. Consider workup   for pulmonary embolism.   The study acquired while the patient in atrial fibrillation with   RVR.  Patient Profile     83 y.o. female with acute on chronic systolic HF, NICM, PAF, Pacer, CKD  Assessment & Plan    1. Acute on  chronic systolic heart failure - overall net negative 2.8 L, with 500 cc and 1 urine occurrence documented yesterday - this has been  her baseline urine output - weight 118 lbs (112 lbs on admission with a peak of 118 lbs) - weight was 109 lbs during her previous admission on 12/17/16 - she was 115 lbs at clinic follow up on 01/05/17 - diuresing on 160 mg lasix BID - home dose - consider boosting with a trial of zaroxolyn today   2. Paroxysmal Afib - amiodarone 200 mg daily - continue coumadin, INR 3.06 (3.51) - per pharmacy - This patients CHA2DS2-VASc Score and unadjusted Ischemic Stroke Rate (% per year) is equal to 7.2 % stroke rate/year from a score of 5 (CHF, HTN, age, female). - atrial pacing on telemetry   3. Acute on chronic kidney disease stage IV - sCr may be at a plateau: 2.98 (2.94)   4. Hx of tachybrady syndrome - s/p PPM, last interrogated 12/25/16 with a low Afib/flutter burden     Signed, Ledora Bottcher , PA-C 10:12 AM 01/20/2017 Pager: [phone]  Personally seen and examined. Agree with above.  83 year old female with failure to thrive, paroxysmal atrial fibrillation, pacemaker, chronic systolic heart failure, acute on chronic stage IV kidney disease, hyponatremia, increased lethargy.  Delirium/confusion  - Possible etiologies include worsening hyponatremia, sundowning from prolonged hospitalizations, failure to thrive, potential respiratory acidosis.  Acute on chronic systolic heart failure  - Earlier assessment by Fabian Sharp, PA she reported that she was having trouble resting flat in requiring more supplemental oxygen and was having more rib pain  - I transitioned her over to by mouth Lasix as she was taking at home 160 mg twice a day yesterday because of her rise in creatinine. Her creatinine continues to elevate today.  - I will place her back on IV Lasix because of her respiratory deterioration and her chest x-ray demonstrates increased interstitial markings.   - IV Lasix may help as well with her hyponatremia   Candee Furbish, MD

## 2017-01-20 NOTE — Consult Note (Signed)
Valerie Terrell is an 83 y.o. female referred by Dr Erlinda Hong Chief Complaint: Acute on CKD 3 HPI: 83yo WF with CKD 3 seconary to HTN and solitary fx Rt kidney (Lt atrophic) admitted 8/27 after a fall, was found to have CHF, was diuresed and has a rising creatinine.  She has known CKD significantly fluctuating in the past with varying volume status.  On admit 8/26 BUN was 51 and creat 1.93 and with diuresis it has risen today to 66 and 2.63m/dl. She has SOB, a poor appetite, new "tremors' and CXR c/w CHF. Weight has increased.  Renal was asked to evaluate.   Past Medical History:  Diagnosis Date  . Atrophy of left kidney    with absent blood flow by renal artery dopplers (Dr. BGwenlyn Found  . 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 Chronic combined systolic and diastolic CHF (congestive heart failure) ([place]    a. 11/2016: echo showing EF of 35-40%, RV strain noted, mild MR and mild TR.   .Marland KitchenChronic renal insufficiency, stage 4 (severe) (HCC)    Baseline GFR 25 ml/min-- renal u/s showed atrophic/hypoplastic left kidney.  No hydronephrosis.  Small right renal cyst.  . History of adenomatous polyp of colon   . History of subarachnoid hemorrhage 10/2014   after syncope and while on xarelto  . Hyperlipidemia   . Hypertension    Difficult to control, in the setting of one functioning kidney: pt was referred to nephrology by Dr. BGwenlyn Found2/2017.  . Lumbar radiculopathy 2012  . Lumbar spondylosis    MR 07/2016---no sign of spinal nerve compression or cord compression.  Pt set up with outpt ortho while admitted to hosp 07/2016.  . Metatarsal fracture 06/10/2016   Nondisplaced, left 5th metatarsal--pt was referred to ortho  . Nonischemic cardiomyopathy ([place]   . Osteopenia 2014   T-score -2.1  . PAF (paroxysmal atrial fibrillation) (HCC)    Eliquis started after BRAO and CVA.  She was changed to warfarin 2018.  . Pulmonary edema 11/2016  . Stroke ([facility]    cardioembolic (had CVA while on no  anticoag)--"scattered subacute punctate infarcts: 1 in R parietal lobe and 2 in occipital cortex" on MRI br.  CT angio head/neck: aortic arch athero.   . Thoracic compression fracture ([place] 07/2016   T7 and T8-- T8 kyphoplasty during hosp admission 07/2016.  Neuro referred pt to pain mgmt for consideration of injection 09/2016.  I referred her to endo 08/2016 for consideration of calcitonin treatment.   Past Surgical History:  Procedure Laterality Date  . APPENDECTOMY  child  . CARDIOVASCULAR STRESS TEST  01/31/2016   Stress myoview: NORMAL/Low risk.  EF 56%.  . C[place]oma . COLONOSCOPY  2015   + hx of adenomatous polyps.  [place] for [place]  . IR GENERIC HISTORICAL  07/24/2016   IR KYPHO THORACIC WITH BONE BIOPSY 07/24/2016 SLuanne Bras MD [facility]  . KYPHOPLASTY  07/27/2016   T8  . PACEMAKER IMPLANT N/A 12/03/2016   Procedure: Pacemaker Implant;  Surgeon: TEvans Lance MD;  [facility]: [facility];  Service: Cardiovascular;  Laterality: N/A;  . Renal artery dopplers  02/26/2016   Her right renal dimension was 11 cm pole to pole with mild to moderate right renal artery stenosis. A right renal aortic ratio was 3.22 suggesting less than a 50% stenosis.  . TONSILLECTOMY    . TRANSTHORACIC ECHOCARDIOGRAM  01/29/2016; 11/2016   2017: EF 55-60%, normal LV wall motion, grade I DD.  No cardiac source of emboli was seen.  2018: EF 35-40%, could not assess DD due to a-fib, pulm HTN noted.   Social History:  reports that she has never smoked. She has never used smokeless tobacco. She reports that she drinks about 0.6 oz of alcohol per week . She reports that she does not use drugs. Allergies:  Allergies  Allergen Reactions  . Clonidine Derivatives Palpitations and Other (See Comments)    Very sedated  . Codeine Nausea Only  . Nickel Rash  . Sulfa Antibiotics Other (See Comments)    Reaction unknown    Family History  Problem Relation Age of Onset  . Cancer Mother   . Heart disease Father   . Early death Father   . Sudden Cardiac Death Neg Hx     Medications:  Prior to Admission:  Prescriptions Prior to Admission  Medication Sig Dispense Refill Last Dose  . acetaminophen (TYLENOL) 500 MG tablet Take 500 mg by mouth every 6 (six) hours as needed for moderate pain.   Past Week at Unknown time  . amiodarone (PACERONE) 200 MG tablet Take 1 tablet (200 mg total) by mouth 2 (two) times daily. 60 tablet 6 01/10/2017 at Unknown time  . amLODipine (NORVASC) 5 MG tablet Take 1 tablet (5 mg total) by mouth daily. 30 tablet 3 01/10/2017 at Unknown time  . Calcium Carb-Cholecalciferol (CALCIUM/VITAMIN D PO) Take 1 tablet by mouth 2 (two) times daily.   01/10/2017 at Unknown time  . carvedilol (COREG) 25 MG tablet Take 1 tablet (25 mg total) by mouth 2 (two) times daily with a meal. 60 tablet 0 01/10/2017 at 0800  . furosemide (LASIX) 80 MG tablet Take 2 tablets (160 mg total) by mouth 2 (two) times daily. 120 tablet 0 01/10/2017 at Unknown time  . hydrALAZINE (APRESOLINE) 100 MG tablet Take 1 tablet (100 mg total) by mouth 3 (three) times daily. 30 tablet 0 01/10/2017 at Unknown time  . ibandronate (BONIVA) 150 MG tablet Take 1 tablet (150 mg total) by mouth every 30 (thirty) days. Take in the morning with a full glass of water, on an empty stomach, and do not take anything else by mouth or lie down for the next 30 min. 3 tablet 3 12/16/2016 at Unknown time  . isosorbide mononitrate (IMDUR) 60 MG 24 hr tablet Take 1 tablet (60 mg total) by mouth daily. 30 tablet 0 01/10/2017 at Unknown time  . Multiple Vitamin (MULTIVITAMIN) tablet Take 1 tablet by mouth 3 (three) times a week.    01/08/2017 at Unknown time  . ondansetron (ZOFRAN) 4 MG tablet Take 0.5-1 tablets (2-4 mg total) by mouth every 8 (eight) hours as needed for nausea or vomiting. 90 tablet 3 Past Week at Unknown time  . oxyCODONE-acetaminophen  (PERCOCET/ROXICET) 5-325 MG tablet Take 1-2 tablets by mouth every 6 (six) hours as needed for severe pain. 60 tablet 0 01/10/2017 at Unknown time  . Potassium Chloride ER 20 MEQ TBCR Take 20 mEq by mouth 2 (two) times daily. 60 tablet 6 01/10/2017 at Unknown time  . pravastatin (PRAVACHOL) 40 MG tablet Take 40 mg by mouth every evening.   01/09/2017 at Unknown time  . vitamin C (ASCORBIC ACID) 500 MG tablet Take 500 mg by mouth daily.   01/10/2017 at Unknown time  . Vitamin D, Ergocalciferol, (DRISDOL) 50000 units CAPS capsule 1 cap po q week x 12 weeks (  Patient taking differently: Take 50,000 Units by mouth every 7 (seven) days. For 12 weeks. (Thursdays)) 12 capsule 1 01/07/2017 at Unknown time  . warfarin (COUMADIN) 3 MG tablet Take 1/2 to 1 tablet by mouth daily as directed by coumadin clinic (Patient taking differently: Take 1.5-3 mg by mouth See admin instructions. Take 1/2 tablet on Monday, Wednesday and Friday then take 1 tablet all the other days) 30 tablet 1 01/09/2017 at 1800   Scheduled: . acetaminophen  1,000 mg Oral QHS  . amiodarone  200 mg Oral Daily  . amLODipine  5 mg Oral Daily  . calcium-vitamin D  1 tablet Oral BID  . carvedilol  25 mg Oral BID WC  . diclofenac  1 patch Transdermal BID  . escitalopram  5 mg Oral QHS  . furosemide  80 mg Intravenous TID  . hydrALAZINE  100 mg Oral TID  . isosorbide mononitrate  60 mg Oral Daily  . mirtazapine  7.5 mg Oral QHS  . multivitamin with minerals  1 tablet Oral Once per day on Mon Wed Fri  . polyethylene glycol  17 g Oral Daily  . pravastatin  80 mg Oral q1800  . sodium chloride flush  3 mL Intravenous Q12H  . vitamin C  500 mg Oral Daily  . Warfarin - Pharmacist Dosing Inpatient   Does not apply q1800   Continuous: . sodium chloride      ROS: as per HPI Blood pressure (!) 128/53, pulse (!) 59, temperature 98.4 F (36.9 C), temperature source Oral, resp. rate 20, height 4' 11"  (1.499 m), weight 53.5 kg (118 lb), SpO2 99  %.  General appearance: miserable appearing female Head: scalp lesions, bandage in front Eyes: negative Neck +HJR, distended ej Resp: diminished breath sounds poor effort Chest wall: rib fx  Cardio: regular rate and rhythm, S1, S2 normal, no murmur, click, rub or gallop GI: soft, non-tender; bowel sounds normal; no masses,  no organomegaly tender RUQ Extremities: edema tr Skin: Skin color, texture, turgor normal. No rashes or lesions Neurologic: Grossly normal myoclonic jerks occ Results for orders placed or performed during the hospital encounter of 01/10/17 (from the past 48 hour(s))  Protime-INR     Status: Abnormal   Collection Time: 01/19/17  6:40 AM  Result Value Ref Range   Prothrombin Time 35.0 (H) 11.4 - 15.2 seconds   INR 3.51   CBC     Status: Abnormal   Collection Time: 01/19/17  6:40 AM  Result Value Ref Range   WBC 10.0 4.0 - 10.5 K/uL   RBC 2.78 (L) 3.87 - 5.11 MIL/uL   Hemoglobin 8.0 (L) 12.0 - 15.0 g/dL   HCT 26.3 (L) 36.0 - 46.0 %   MCV 94.6 78.0 - 100.0 fL   MCH 28.8 26.0 - 34.0 pg   MCHC 30.4 30.0 - 36.0 g/dL   RDW 15.8 (H) 11.5 - 15.5 %   Platelets 269 150 - 400 K/uL  Basic metabolic panel     Status: Abnormal   Collection Time: 01/19/17  6:40 AM  Result Value Ref Range   Sodium 131 (L) 135 - 145 mmol/L   Potassium 3.9 3.5 - 5.1 mmol/L   Chloride 89 (L) 101 - 111 mmol/L   CO2 31 22 - 32 mmol/L   Glucose, Bld 98 65 - 99 mg/dL   BUN 66 (H) 6 - 20 mg/dL   Creatinine, Ser 2.94 (H) 0.44 - 1.00 mg/dL   Calcium 8.7 (L) 8.9 - 10.3 mg/dL  GFR calc non Af Amer 14 (L) >60 mL/min   GFR calc Af Amer 16 (L) >60 mL/min    Comment: (NOTE) The eGFR has been calculated using the CKD EPI equation. This calculation has not been validated in all clinical situations. eGFR's persistently <60 mL/min signify possible Chronic Kidney Disease.    Anion gap 11 5 - 15  Magnesium     Status: None   Collection Time: 01/19/17  6:40 AM  Result Value Ref Range   Magnesium 2.2  1.7 - 2.4 mg/dL  Protime-INR     Status: Abnormal   Collection Time: 01/20/17  2:49 AM  Result Value Ref Range   Prothrombin Time 31.4 (H) 11.4 - 15.2 seconds   INR 4.31   Basic metabolic panel     Status: Abnormal   Collection Time: 01/20/17  2:49 AM  Result Value Ref Range   Sodium 128 (L) 135 - 145 mmol/L   Potassium 4.0 3.5 - 5.1 mmol/L   Chloride 89 (L) 101 - 111 mmol/L   CO2 28 22 - 32 mmol/L   Glucose, Bld 105 (H) 65 - 99 mg/dL   BUN 66 (H) 6 - 20 mg/dL   Creatinine, Ser 2.98 (H) 0.44 - 1.00 mg/dL   Calcium 8.3 (L) 8.9 - 10.3 mg/dL   GFR calc non Af Amer 14 (L) >60 mL/min   GFR calc Af Amer 16 (L) >60 mL/min    Comment: (NOTE) The eGFR has been calculated using the CKD EPI equation. This calculation has not been validated in all clinical situations. eGFR's persistently <60 mL/min signify possible Chronic Kidney Disease.    Anion gap 11 5 - 15  Blood gas, arterial     Status: Abnormal   Collection Time: 01/20/17  1:30 PM  Result Value Ref Range   O2 Content 4.0 L/min   Delivery systems NASAL CANNULA    pH, Arterial 7.411 7.350 - 7.450   pCO2 arterial 50.4 (H) 32.0 - 48.0 mmHg   pO2, Arterial 84.6 83.0 - 108.0 mmHg   Bicarbonate 31.4 (H) 20.0 - 28.0 mmol/L   Acid-Base Excess 6.8 (H) 0.0 - 2.0 mmol/L   O2 Saturation 97.4 %   Patient temperature 98.6    Collection site LEFT RADIAL    Drawn by [id number]    Sample type ARTERIAL DRAW    Allens test (pass/fail) PASS PASS  Urinalysis, Routine w reflex microscopic     Status: Abnormal   Collection Time: 01/20/17  5:37 PM  Result Value Ref Range   Color, Urine YELLOW YELLOW   APPearance CLEAR CLEAR   Specific Gravity, Urine 1.012 1.005 - 1.030   pH 5.0 5.0 - 8.0   Glucose, UA NEGATIVE NEGATIVE mg/dL   Hgb urine dipstick NEGATIVE NEGATIVE   Bilirubin Urine NEGATIVE NEGATIVE   Ketones, ur NEGATIVE NEGATIVE mg/dL   Protein, ur 30 (A) NEGATIVE mg/dL   Nitrite NEGATIVE NEGATIVE   Leukocytes, UA NEGATIVE NEGATIVE   RBC /  HPF 0-5 0 - 5 RBC/hpf   WBC, UA 0-5 0 - 5 WBC/hpf   Bacteria, UA RARE (A) NONE SEEN   Squamous Epithelial / LPF NONE SEEN NONE SEEN   Hyaline Casts, UA PRESENT    Dg Chest Port 1 View  Result Date: 01/20/2017 CLINICAL DATA:  CHF, history of previous CVA, stage IV chronic renal insufficiency, CHF, nonsmoker. EXAM: PORTABLE CHEST 1 VIEW COMPARISON:  PA and lateral chest x-ray of January 16, 2017 FINDINGS: The lungs are reasonably well inflated.  The interstitial markings are increased and the pulmonary vascularity is engorged. The cardiac silhouette remains enlarged. The retrocardiac region is slightly more dense today. There is calcification in the wall of the aortic arch. The ICD is in stable position. The patient has undergone kyphoplasty of approximately T8 in the past. There are old right posterior rib fractures. IMPRESSION: CHF with mild interstitial edema. Left lower lobe atelectasis and probable left pleural effusion. The ICD is in stable position. Thoracic aortic atherosclerosis. Electronically Signed   By: David  Martinique M.D.   On: 01/20/2017 13:30    Assessment:  1 CKD3 with acute exacerbation, hemodynamically mediated 2 Solitary functioning right kidney 3 CHF/ volume overload 4 Afib/warfarin  Plan: 1 Increase diuretic dosage 2 I am concerned about anorexia and myoclonus suggesting potential toxin/metabolic/medications v uremic 3 Would consider BiPAP for hypercarbia 4 Dialysis if fails other options  Melissia Lahman C 01/20/2017, 6:50 PM

## 2017-01-20 NOTE — Patient Outreach (Signed)
[facility]) Care Management  01/20/2017  Valerie Terrell Apr 23, 1933 [id number]   Assessment: 83 year old with recent admission 7/16-7/23 with respiratory failure due to pulmonary edema due to volume overload from acute kidney failure; Pacer placed 7/19 due to tachy brady syndrome. Client readmitted 7/31-8/2 with heart failure, atrial fibrillation, hypertension. Currently admitted 8/26-present for acute on chronic respiratory failure with hypoxia.  Client has been in hospital 10 days or greater and is therefore not eligible for services at this time. Client will be re-assessed for eligibility upon discharged.   Plan: per policy, RNCM will close case. Update [facility] hospital liaison.  Thea Silversmith, RN, MSN, [place] Coordinator Cell: [phone]

## 2017-01-20 NOTE — Progress Notes (Signed)
ANTICOAGULATION CONSULT NOTE - Follow Up Consult  Pharmacy Consult for Coumadin Indication: atrial fibrillation  Allergies  Allergen Reactions  . Clonidine Derivatives Palpitations and Other (See Comments)    Very sedated  . Codeine Nausea Only  . Nickel Rash  . Sulfa Antibiotics Other (See Comments)    Reaction unknown   Patient Measurements: Height: 4\' 11"  (149.9 cm) Weight: 118 lb (53.5 kg) (scale b) IBW/kg (Calculated) : 43.2  Vital Signs: Temp: 98.2 F (36.8 C) (09/05 0542) Temp Source: Oral (09/05 0542) BP: 153/52 (09/05 0542) Pulse Rate: 60 (09/05 0542)  Labs:  Recent Labs  01/18/17 0430 01/19/17 0640 01/20/17 0249  HGB  --  8.0*  --   HCT  --  26.3*  --   PLT  --  269  --   LABPROT 30.8* 35.0* 31.4*  INR 2.98 3.51 3.06  CREATININE  --  2.94* 2.98*   Estimated Creatinine Clearance: 11.1 mL/min (A) (by C-G formula based on SCr of 2.98 mg/dL (H)).  Assessment:  83 yr old female on Coumadin for afib.  INR slightly above goal 3.06 after being 3.51 yesterday.  Two doses of warfarin have been held. No updated CBC today; no noted bleeding. The INR increase is likely due to an interaction between warfarin and levafloxacin. The patient has been on levafloxacin since 9/1.   Home Coumadin regimen: 3 mg TTSS, 1.5 mg MWF  Goal of Therapy:  INR 2-3 Monitor platelets by anticoagulation protocol: Yes   Plan:  Warfarin 1mg  x1 tonight Daily PT/INR Monitor for s/sx of bleeding  Georga Bora, PharmD Clinical Pharmacist 01/20/2017 8:29 AM

## 2017-01-20 NOTE — Progress Notes (Signed)
Physical Therapy Treatment Patient Details Name: Valerie Terrell MRN: [id number] DOB: 1933/05/06 Today's Date: 01/20/2017    History of Present Illness Pt is an 83 y.o. female who presented to the ED for evaluation of bleeding head wound and R back pain after a fall. In the ED, hypoxic at 88% on her supplemental oxygen, tachypneic, mildly hypertensive, chest x-ray showed nondisplaced right ninth rib fracture without complicating features as well as multiple old right rib fractures with healing. She was briefly placed on nonrebreather mask. Admitted for acute on chronic hypoxic respiratory failure secondary to acute on chronic systolic and diastolic CHF and chest wall splinting related to pain from rib fracture. PMH significant for: chronic combined diastolic and systolic CHF, NICM, stage IV chronic kidney disease, CAD, PAF on warfarin, home oxygen 3-4 L/m, HLD, HTN, and CVA.    PT Comments    Patient required min/mod A for all mobility. Pt limited by fatigue and stool incontinence but tolerated gait of 24ft X2. Given pt's current mobility level recommending SNF for further skilled PT services to maximize independence and safety with mobility.   Follow Up Recommendations  Supervision/Assistance - 24 hour;SNF     Equipment Recommendations  Other (comment) (TBD next venue)    Recommendations for Other Services       Precautions / Restrictions Precautions Precautions: Fall Restrictions Weight Bearing Restrictions: No    Mobility  Bed Mobility Overal bed mobility: Needs Assistance Bed Mobility: Supine to Sit;Sit to Supine     Supine to sit: HOB elevated;Min assist Sit to supine: Mod assist   General bed mobility comments: cues for sequenicng; increased time; assist to elevate trunk into sitting and bring bilat LE into bed  Transfers Overall transfer level: Needs assistance Equipment used: Rolling walker (2 wheeled) Transfers: Sit to/from Stand Sit to Stand: Min assist          General transfer comment: assist to steady from EOB and BSC; cues for safe hand placement  Ambulation/Gait Ambulation/Gait assistance: Mod assist Ambulation Distance (Feet):  (34ft X2) Assistive device: Rolling walker (2 wheeled) Gait Pattern/deviations: Shuffle;Decreased step length - right;Decreased step length - left;Trunk flexed;Narrow base of support Gait velocity: slow   General Gait Details: assist for balance and weight shifting as well as managing RW; multimodal cues for posture   Stairs            Wheelchair Mobility    Modified Rankin (Stroke Patients Only)       Balance Overall balance assessment: Needs assistance Sitting-balance support: Bilateral upper extremity supported Sitting balance-Leahy Scale: Fair     Standing balance support: Bilateral upper extremity supported Standing balance-Leahy Scale: Poor                              Cognition Arousal/Alertness: Awake/alert Behavior During Therapy: WFL for tasks assessed/performed Overall Cognitive Status: History of cognitive impairments - at baseline Area of Impairment: Following commands;Safety/judgement;Awareness                   Current Attention Level: Selective   Following Commands: Follows one step commands with increased time Safety/Judgement: Decreased awareness of deficits;Decreased awareness of safety Awareness: Emergent Problem Solving: Slow processing;Decreased initiation;Requires verbal cues;Requires tactile cues        Exercises      General Comments General comments (skin integrity, edema, etc.): SpO2 90% or > on 4L O2 via [place]; therapist strongly encouraged pt to sit OOB in recliner throuhgout  the day-pt declined      Pertinent Vitals/Pain Pain Assessment: No/denies pain    Home Living                      Prior Function            PT Goals (current goals can now be found in the care plan section) Acute Rehab PT Goals PT Goal Formulation:  With patient Time For Goal Achievement: 01/20/17 Potential to Achieve Goals: Fair Progress towards PT goals: Not progressing toward goals - comment    Frequency    Min 3X/week      PT Plan Discharge plan needs to be updated    Co-evaluation              AM-PAC PT "6 Clicks" Daily Activity  Outcome Measure  Difficulty turning over in bed (including adjusting bedclothes, sheets and blankets)?: Unable Difficulty moving from lying on back to sitting on the side of the bed? : Unable Difficulty sitting down on and standing up from a chair with arms (e.g., wheelchair, bedside commode, etc,.)?: Unable Help needed moving to and from a bed to chair (including a wheelchair)?: A Little Help needed walking in hospital room?: A Lot Help needed climbing 3-5 steps with a railing? : A Lot 6 Click Score: 10    End of Session Equipment Utilized During Treatment: Oxygen;Gait belt Activity Tolerance: Patient limited by fatigue Patient left: in bed;with call bell/phone within reach Nurse Communication: Mobility status PT Visit Diagnosis: Unsteadiness on feet (R26.81);Other abnormalities of gait and mobility (R26.89);Repeated falls (R29.6);Muscle weakness (generalized) (M62.81);History of falling (Z91.81);Difficulty in walking, not elsewhere classified (R26.2)     Time: 2111-7356 PT Time Calculation (min) (ACUTE ONLY): 26 min  Charges:  $Gait Training: 8-22 mins $Therapeutic Activity: 8-22 mins                    G Codes:       Earney Navy, PTA Pager: [phone]     Darliss Cheney 01/20/2017, 4:45 PM

## 2017-01-20 NOTE — Clinical Social Work Note (Signed)
Clinical Social Work Assessment  Patient Details  Name: Valerie Terrell MRN: [id number] Date of Birth: 02/26/1933  Date of referral:  01/20/17               Reason for consult:  Facility Placement, Discharge Planning                Permission sought to share information with:  Facility Contact Representative, Family Supports Permission granted to share information::  Yes, Verbal Permission Granted  Name::     Allison Reitz  Agency::  SNF's  Relationship::  Daughter  Contact Information:  [phone]  Housing/Transportation Living arrangements for the past 2 months:  Single Family Home Source of Information:  Patient, Medical Team, Adult Children Patient Interpreter Needed:  None Criminal Activity/Legal Involvement Pertinent to Current Situation/Hospitalization:  No - Comment as needed Significant Relationships:  Adult Children Lives with:  Adult Children Do you feel safe going back to the place where you live?  Yes Need for family participation in patient care:  Yes (Comment)  Care giving concerns:  Patient and daughter now willing to consider SNF placement once medically stable for discharge.   Social Worker assessment / plan:  CSW met with patient. Daughter at bedside. CSW introduced role and explained that discharge planning would be discussed. Patient and her daughter confirmed they are willing to consider SNF. Patient speaks very quietly and difficult to understand at times. Patient's daughter voiced concerns with falls at home. Discussed SNF search process and how insurance covers. When discussing how insurance covers SNF, patient's daughter became tearful but said she was alright. SNF list provided for review. Patient's daughter is agreeable to CSW sending out referral to see what options are available. No further concerns. CSW encouraged patient and her daughter to contact CSW as needed. CSW will continue to follow patient and her daughter for support and facilitate discharge to  SNF once medically stable.  Employment status:  Retired Insurance information:  Managed Medicare PT Recommendations:  Home with Home Health Information / Referral to community resources:  Skilled Nursing Facility  Patient/Family's Response to care:  Patient and her daughter are agreeable to SNF placement. Patient's daughter supportive and involved in patient's care. Patient and her daughter appreciated social work intervention.  Patient/Family's Understanding of and Emotional Response to Diagnosis, Current Treatment, and Prognosis:  Patient and her daughter have a good understanding of the reason for admission and her need for rehab prior to returning home. Patient and her daughter appear happy with hospital care.  Emotional Assessment Appearance:  Appears stated age Attitude/Demeanor/Rapport:  Lethargic Affect (typically observed):  Accepting, Quiet Orientation:  Oriented to Self, Oriented to Place, Oriented to  Time, Oriented to Situation Alcohol / Substance use:  Never Used Psych involvement (Current and /or in the community):  Yes (Comment)  Discharge Needs  Concerns to be addressed:  Care Coordination Readmission within the last 30 days:  Yes Current discharge risk:  Dependent with Mobility Barriers to Discharge:  Continued Medical Work up    C , LCSW 01/20/2017, 11:58 AM  

## 2017-01-20 NOTE — Progress Notes (Signed)
CM talked to patient with her daughter at the bedside; they are open to SNF placement at this time. Judson Roch SW called and will see the pt/ daughter soon. Mindi Slicker [facility] [phone]

## 2017-01-21 DIAGNOSIS — N184 Chronic kidney disease, stage 4 (severe): Secondary | ICD-10-CM

## 2017-01-21 LAB — COMPREHENSIVE METABOLIC PANEL
ALT: 16 U/L (ref 14–54)
AST: 22 U/L (ref 15–41)
Albumin: 2.9 g/dL — ABNORMAL LOW (ref 3.5–5.0)
Alkaline Phosphatase: 64 U/L (ref 38–126)
Anion gap: 10 (ref 5–15)
BUN: 64 mg/dL — ABNORMAL HIGH (ref 6–20)
CHLORIDE: 87 mmol/L — AB (ref 101–111)
CO2: 30 mmol/L (ref 22–32)
Calcium: 8 mg/dL — ABNORMAL LOW (ref 8.9–10.3)
Creatinine, Ser: 2.76 mg/dL — ABNORMAL HIGH (ref 0.44–1.00)
GFR, EST AFRICAN AMERICAN: 17 mL/min — AB (ref >60)
GFR, EST NON AFRICAN AMERICAN: 15 mL/min — AB (ref >60)
Glucose, Bld: 101 mg/dL — ABNORMAL HIGH (ref 65–99)
POTASSIUM: 3.9 mmol/L (ref 3.5–5.1)
Sodium: 127 mmol/L — ABNORMAL LOW (ref 135–145)
Total Bilirubin: 0.7 mg/dL (ref 0.3–1.2)
Total Protein: 6 g/dL — ABNORMAL LOW (ref 6.5–8.1)

## 2017-01-21 LAB — CBC
HCT: 23.7 % — ABNORMAL LOW (ref 36.0–46.0)
Hemoglobin: 7.5 g/dL — ABNORMAL LOW (ref 12.0–15.0)
MCH: 29.4 pg (ref 26.0–34.0)
MCHC: 31.6 g/dL (ref 30.0–36.0)
MCV: 92.9 fL (ref 78.0–100.0)
PLATELETS: 244 10*3/uL (ref 150–400)
RBC: 2.55 MIL/uL — ABNORMAL LOW (ref 3.87–5.11)
RDW: 16 % — ABNORMAL HIGH (ref 11.5–15.5)
WBC: 8.7 10*3/uL (ref 4.0–10.5)

## 2017-01-21 LAB — AMMONIA: AMMONIA: 11 umol/L (ref 9–35)

## 2017-01-21 LAB — PROTIME-INR
INR: 2.66
PROTHROMBIN TIME: 28.1 s — AB (ref 11.4–15.2)

## 2017-01-21 MED ORDER — FOLIC ACID 1 MG PO TABS
1.0000 mg | ORAL_TABLET | Freq: Every day | ORAL | Status: DC
  Administered 2017-01-22 – 2017-02-03 (×8): 1 mg via ORAL
  Filled 2017-01-21 (×11): qty 1

## 2017-01-21 MED ORDER — WARFARIN SODIUM 2.5 MG PO TABS
2.5000 mg | ORAL_TABLET | Freq: Once | ORAL | Status: AC
  Administered 2017-01-21: 2.5 mg via ORAL
  Filled 2017-01-21: qty 1

## 2017-01-21 MED ORDER — FUROSEMIDE 10 MG/ML IJ SOLN
100.0000 mg | Freq: Three times a day (TID) | INTRAVENOUS | Status: DC
  Administered 2017-01-21 – 2017-01-22 (×3): 100 mg via INTRAVENOUS
  Filled 2017-01-21 (×4): qty 10

## 2017-01-21 NOTE — Clinical Social Work Note (Signed)
CSW met with patient and her daughter. Patient much more alert and interactive today. CSW provided bed offers. Patient and her daughter will review and let CSW know when they have chosen one.   , CSW [phone]  

## 2017-01-21 NOTE — Progress Notes (Addendum)
PROGRESS NOTE  Valerie Terrell RDE:[id number] DOB: May 29, 1932 DOA: 01/10/2017 PCP: Tammi Sou, MD  HPI/Recap of past 24 hours:  Feeling better today, more awake and interactive,   urine output was not accurately documented  Assessment/Plan: Principal Problem:   Acute on chronic respiratory failure with hypoxia ([place]) Active Problems:   PAF (paroxysmal atrial fibrillation) (HCC)   Essential hypertension   CKD (chronic kidney disease), stage IV (HCC)   Acute combined systolic and diastolic heart failure (HCC) - possibly related to A. fib   Fracture of one rib, right side, initial encounter for closed fracture  Drowsiness/encephalopathy? -Patient was found to be very drowsy on 9/5 am, she falls back to sleep during conversation, but oriented x3,  - No narcotic or sedative the night before or 9/5 am -Drowsiness likely multifactorial including worsening chf with poor brain perfusion, hypoxia, hyponatremia, worsening of bun/cr, delirium from FTT, prolonged hospitalization could also contribute -cxr with worsening of chf, stat ABG co2 upper normal limit with normal PH.  -seems improving, will hold off ct head   Acute on chronic hypoxic respiratory failure (home o2 3-4liters at baseline): - Chest x-ray on admission showed interstitial pulmonary edema, stable bilateral pleural effusion and possible left lower lobe atelectasis  -treating chf (cxr with Atelectasis versus left lower lobe pneumonia on admission: she received oral Levaquin x 8days. No fever. Continue to monitor off abx)  Acute on chronic systolic chf: -Nonischemic cardiomyopathy (no regional wall motion on echo, negative stress test),EF of 35-40% by echo in July. -chf Presumed from afib -she appear to have increased oxygen requirement and become more drowsy this am on 9/5, suspect worsening of chf, repeat  cxr on 9/5 confirmed chf, -patient is started back on iv lasix, cardiology and nephrology consulted.    Hyponatremia: from fluids overload?  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  Coumadin per pharmacy  Cardiology following  Acute kidney injury on Chronic kidney disease stage IV:  -History of a nonfunctioning left kidney with mild right renal artery stenosis demonstrated by duplex ultrasound October 2017. - bun/cr elevated, patient become more drowsy on 9/5 - nephrology Dr Florene Glen consulted, input appreciated - Avoid nephrotoxins. Monitor BMP.   Anemia , normocytic, no sign of bleed, last fobt in 11/2016 was negative, will repeat FOBT Low folate, will replace folate, iron/b12 adequate. tibili wnl. Calcium low Likely component of anemia of chronic disease Consider transfusion if hgb less than 7. Consider EPO will discuss with nephrology.   Essential hypertension: currently on  amlodipine, Lasix, Coreg, hydralazine, Imdur  Undisplaced right ninth rib fracture without complicating feature, multiple old right rib fractures with healing: Pain management, incentive spirometer and supportive care. PT OT evaluation.  Generalized anxiety/depression:  Started Lexapro 5 mg daily at bedtime and mirtazapine 7.5 mg at bedtime by psychiatrist. Encourage oral intake. Mortise is stable today. Evaluated by psychiatrist.  FTT: this is her 5 hospitalizations this year, daughter report 40pounds of weight loss this year. Will get nutrition/PT/OT,  Condition not improving, may need SNF ( significant care giver exhaustion as well)  Body mass index is 23.53 kg/m.   DVT prophylaxis: Coumadin Code Status: Full code Family Communication: Patient's daughter at bedside Disposition Plan: not ready for discharge, may need SNF at discharge .    Consultants:  Cardiology   Nephrology  psychiatry  Procedures: None Antimicrobials: Levaquin from admission to 9/5.    Objective: BP (!) 154/54 (BP Location: Left Arm)   Pulse (!) 59   Temp 98.4 F (36.9 C) (Oral)   Resp 18   Ht 4\' 11"  (1.499 m)   Wt 52.8 kg (116 lb 8 oz)   SpO2 96%   BMI 23.53 kg/m   Intake/Output Summary (Last 24 hours) at 01/21/17 0801 Last data filed at 01/21/17 0159  Gross per 24 hour  Intake              302 ml  Output              400 ml  Net              -98 ml   Filed Weights   01/19/17 0500 01/20/17 0542 01/21/17 0605  Weight: 49.6 kg (109 lb 5.6 oz) 53.5 kg (118 lb) 52.8 kg (116 lb 8 oz)    Exam: Patient is examined daily including today on 01/21/2017, exams remain the same as of yesterday except that has changed    General:  Frail, less drowsy today, oriented x3  Cardiovascular: paced rhythm  Respiratory: poor respiratory effort, crackles at basis, no wheezing, no rhonchi  Abdomen: Soft/ND/NT, positive BS  Musculoskeletal: No Edema  Neuro: less drowsy , oriented  x3  Data Reviewed: Basic Metabolic Panel:  Recent Labs Lab 01/15/17 0337 01/16/17 0632 01/17/17 0759 01/19/17 0640 01/20/17 0249 01/21/17 0424  NA 137 137 132* 131* 128* 127*  K 4.0 4.2 4.2 3.9 4.0 3.9  CL 96* 94* 91* 89* 89* 87*  CO2 32 30 29 31 28 30   GLUCOSE 114* 106* 106* 98 105* 101*  BUN 56* 56* 58* 66* 66* 64*  CREATININE 2.23* 2.33* 2.47* 2.94* 2.98* 2.76*  CALCIUM 9.5 9.6 9.4 8.7* 8.3* 8.0*  MG  --   --   --  2.2  --   --   PHOS 3.1  --   --   --   --   --    Liver Function Tests:  Recent Labs Lab 01/15/17 0337 01/17/17 0759 01/21/17 0424  AST  --  23 22  ALT  --  18 16  ALKPHOS  --  52 64  BILITOT  --  0.4 0.7  PROT  --  6.4* 6.0*  ALBUMIN 3.1* 3.1* 2.9*   No results for input(s): LIPASE, AMYLASE in the last 168 hours.  Recent Labs Lab 01/21/17 0424  AMMONIA 11   CBC:  Recent Labs Lab 01/15/17 0337 01/17/17 0801 01/19/17 0640  01/21/17 0424  WBC 8.5 11.8* 10.0 8.7  NEUTROABS 6.7 9.8*  --   --   HGB 8.4* 8.3* 8.0* 7.5*  HCT 27.8* 27.1* 26.3* 23.7*  MCV 95.5 95.1 94.6 92.9  PLT 235 251 269 244   Cardiac Enzymes:   No results for input(s): CKTOTAL, CKMB, CKMBINDEX, TROPONINI in the last 168 hours. BNP (last 3 results)  Recent Labs  11/30/16 1331 12/14/16 2026 01/10/17 2203  BNP >4,500.0* 919.4* 1,996.8*    ProBNP (last 3 results) No results for input(s): PROBNP in the last 8760 hours.  CBG: No results for input(s): GLUCAP in the last 168 hours.  Recent Results (from the past 240 hour(s))  Culture, blood (routine x 2) Call MD if unable to obtain prior to antibiotics being given     Status: None   Collection Time: 01/13/17 12:55 PM  Result Value  Ref Range Status   Specimen Description BLOOD LEFT HAND  Final   Special Requests IN PEDIATRIC BOTTLE Blood Culture adequate volume  Final   Culture NO GROWTH 5 DAYS  Final   Report Status 01/18/2017 FINAL  Final  Culture, blood (routine x 2) Call MD if unable to obtain prior to antibiotics being given     Status: None   Collection Time: 01/13/17 12:56 PM  Result Value Ref Range Status   Specimen Description BLOOD LEFT FOREARM  Final   Special Requests IN PEDIATRIC BOTTLE Blood Culture adequate volume  Final   Culture NO GROWTH 5 DAYS  Final   Report Status 01/18/2017 FINAL  Final     Studies: Dg Chest Port 1 View  Result Date: 01/20/2017 CLINICAL DATA:  CHF, history of previous CVA, stage IV chronic renal insufficiency, CHF, nonsmoker. EXAM: PORTABLE CHEST 1 VIEW COMPARISON:  PA and lateral chest x-ray of January 16, 2017 FINDINGS: The lungs are reasonably well inflated. The interstitial markings are increased and the pulmonary vascularity is engorged. The cardiac silhouette remains enlarged. The retrocardiac region is slightly more dense today. There is calcification in the wall of the aortic arch. The ICD is in stable position. The patient has undergone  kyphoplasty of approximately T8 in the past. There are old right posterior rib fractures. IMPRESSION: CHF with mild interstitial edema. Left lower lobe atelectasis and probable left pleural effusion. The ICD is in stable position. Thoracic aortic atherosclerosis. Electronically Signed   By: David  Martinique M.D.   On: 01/20/2017 13:30    Scheduled Meds: . acetaminophen  1,000 mg Oral QHS  . amiodarone  200 mg Oral Daily  . amLODipine  5 mg Oral Daily  . calcium-vitamin D  1 tablet Oral BID  . carvedilol  25 mg Oral BID WC  . diclofenac  1 patch Transdermal BID  . escitalopram  5 mg Oral QHS  . hydrALAZINE  100 mg Oral TID  . isosorbide mononitrate  60 mg Oral Daily  . mirtazapine  7.5 mg Oral QHS  . multivitamin with minerals  1 tablet Oral Once per day on Mon Wed Fri  . polyethylene glycol  17 g Oral Daily  . pravastatin  80 mg Oral q1800  . sodium chloride flush  3 mL Intravenous Q12H  . vitamin C  500 mg Oral Daily  . Warfarin - Pharmacist Dosing Inpatient   Does not apply q1800    Continuous Infusions: . sodium chloride    . furosemide Stopped (01/20/17 2252)     Time spent: 35 mins I have personally reviewed and interpreted on  01/21/2017 daily labs, tele strips, imagings as discussed above under data review session and assessment and plans.  I reviewed all nursing notes, pharmacy notes, consultant notes,  vitals, pertinent old records  I have discussed plan of care as described above with cardiology, RN , patient  on 01/21/2017   Imelda Dandridge MD, PhD  [facility] Pager [phone]. If 7PM-7AM, please contact night-coverage at [email or web address], password [facility] 01/21/2017, 8:01 AM  LOS: 10 days

## 2017-01-21 NOTE — Progress Notes (Signed)
Assessment:  1 CKD3 with acute exacerbation, hemodynamically mediated 2 Solitary functioning right kidney 3 CHF/ volume overload 4 Afib/warfarin  Plan: 1 Slight diuretic dosage reduction done  Subjective: Interval History: feels better  Objective: Vital signs in last 24 hours: Temp:  [98.4 F (36.9 C)] 98.4 F (36.9 C) (09/06 0605) Pulse Rate:  [59-60] 60 (09/06 1052) Resp:  [18-20] 18 (09/06 0605) BP: (124-154)/(48-54) 143/53 (09/06 1052) SpO2:  [96 %-99 %] 96 % (09/06 0605) Weight:  [52.8 kg (116 lb 8 oz)] 52.8 kg (116 lb 8 oz) (09/06 0605) Weight change: -0.68 kg (-1 lb 8 oz)  Intake/Output from previous day: 09/05 0701 - 09/06 0700 In: 302 [P.O.:240; IV Piggyback:62] Out: 400 [Urine:400] Intake/Output this shift: No intake/output data recorded.  General appearance: alert and cooperative Chest wall: no tenderness Cardio: regular rate and rhythm, S1, S2 normal, no murmur, click, rub or gallop Extremities: edema 1+  Less myoclonus, but mild asterixis present  Lab Results:  Recent Labs  01/19/17 0640 01/21/17 0424  WBC 10.0 8.7  HGB 8.0* 7.5*  HCT 26.3* 23.7*  PLT 269 244   BMET:  Recent Labs  01/20/17 0249 01/21/17 0424  NA 128* 127*  K 4.0 3.9  CL 89* 87*  CO2 28 30  GLUCOSE 105* 101*  BUN 66* 64*  CREATININE 2.98* 2.76*  CALCIUM 8.3* 8.0*   No results for input(s): PTH in the last 72 hours. Iron Studies: No results for input(s): IRON, TIBC, TRANSFERRIN, FERRITIN in the last 72 hours. Studies/Results: Dg Chest Port 1 View  Result Date: 01/20/2017 CLINICAL DATA:  CHF, history of previous CVA, stage IV chronic renal insufficiency, CHF, nonsmoker. EXAM: PORTABLE CHEST 1 VIEW COMPARISON:  PA and lateral chest x-ray of January 16, 2017 FINDINGS: The lungs are reasonably well inflated. The interstitial markings are increased and the pulmonary vascularity is engorged. The cardiac silhouette remains enlarged. The retrocardiac region is slightly more  dense today. There is calcification in the wall of the aortic arch. The ICD is in stable position. The patient has undergone kyphoplasty of approximately T8 in the past. There are old right posterior rib fractures. IMPRESSION: CHF with mild interstitial edema. Left lower lobe atelectasis and probable left pleural effusion. The ICD is in stable position. Thoracic aortic atherosclerosis. Electronically Signed   By: Valerie  Terrell M.D.   On: 01/20/2017 13:30    Scheduled: . acetaminophen  1,000 mg Oral QHS  . amiodarone  200 mg Oral Daily  . amLODipine  5 mg Oral Daily  . calcium-vitamin D  1 tablet Oral BID  . carvedilol  25 mg Oral BID WC  . escitalopram  5 mg Oral QHS  . hydrALAZINE  100 mg Oral TID  . isosorbide mononitrate  60 mg Oral Daily  . mirtazapine  7.5 mg Oral QHS  . multivitamin with minerals  1 tablet Oral Once per day on Mon Wed Fri  . polyethylene glycol  17 g Oral Daily  . pravastatin  80 mg Oral q1800  . sodium chloride flush  3 mL Intravenous Q12H  . vitamin C  500 mg Oral Daily  . Warfarin - Pharmacist Dosing Inpatient   Does not apply q1800    LOS: 10 days   Valerie Terrell C 01/21/2017,12:53 PM

## 2017-01-21 NOTE — Progress Notes (Signed)
Occupational Therapy Treatment Patient Details Name: Valerie Terrell MRN: [id number] DOB: 1933-02-13 Today's Date: 01/21/2017    History of present illness Pt is an 83 y.o. female who presented to the ED for evaluation of bleeding head wound and R back pain after a fall. In the ED, hypoxic at 88% on her supplemental oxygen, tachypneic, mildly hypertensive, chest x-ray showed nondisplaced right ninth rib fracture without complicating features as well as multiple old right rib fractures with healing. She was briefly placed on nonrebreather mask. Admitted for acute on chronic hypoxic respiratory failure secondary to acute on chronic systolic and diastolic CHF and chest wall splinting related to pain from rib fracture. PMH significant for: chronic combined diastolic and systolic CHF, NICM, stage IV chronic kidney disease, CAD, PAF on warfarin, home oxygen 3-4 L/m, HLD, HTN, and CVA.   OT comments  Pt with bowel incontinence, cleaned up and changed pad x 2. Bottom is sore. NT is aware. Pt able to get to EOB without assist and ambulate to chair at sink with min assist and RW. Performed seated grooming with supervision. Pt much less anxious and demonstrating improved cognition this visit. Updated plan to d/c to SNF for further rehab prior t return home with her daughter.  Follow Up Recommendations  SNF;Supervision/Assistance - 24 hour    Equipment Recommendations  3 in 1 bedside commode;Tub/shower seat    Recommendations for Other Services      Precautions / Restrictions Precautions Precautions: Fall       Mobility Bed Mobility Overal bed mobility: Needs Assistance Bed Mobility: Supine to Sit     Supine to sit: Supervision     General bed mobility comments: used rail, no physical assist  Transfers Overall transfer level: Needs assistance Equipment used: Rolling walker (2 wheeled) Transfers: Sit to/from Stand Sit to Stand: Min assist Stand pivot transfers: Min assist       General  transfer comment: steadying assist, stood x 3    Balance Overall balance assessment: Needs assistance   Sitting balance-Leahy Scale: Fair     Standing balance support: Single extremity supported Standing balance-Leahy Scale: Poor                             ADL either performed or assessed with clinical judgement   ADL Overall ADL's : Needs assistance/impaired     Grooming: Wash/dry hands;Wash/dry face;Oral care;Brushing hair;Sitting;Supervision/safety                   Toilet Transfer: Minimal assistance;Stand-pivot;BSC;RW   Toileting- Clothing Manipulation and Hygiene: Maximal assistance;Sit to/from stand         General ADL Comments: pt with poor      Vision   Additional Comments: wears bifocals   Perception     Praxis      Cognition Arousal/Alertness: Awake/alert Behavior During Therapy: WFL for tasks assessed/performed Overall Cognitive Status: History of cognitive impairments - at baseline                               Problem Solving: Slow processing General Comments: pt much more alert and less anxious        Exercises     Shoulder Instructions       General Comments      Pertinent Vitals/ Pain       Pain Assessment: Faces Faces Pain Scale: Hurts even more Pain Location: bottom  Pain Descriptors / Indicators: Grimacing;Guarding Pain Intervention(s): Monitored during session;Repositioned  Home Living                                          Prior Functioning/Environment              Frequency  Min 2X/week        Progress Toward Goals  OT Goals(current goals can now be found in the care plan section)  Progress towards OT goals: Progressing toward goals  Acute Rehab OT Goals Patient Stated Goal: to go home OT Goal Formulation: With patient Time For Goal Achievement: 01/26/17 Potential to Achieve Goals: Good  Plan Discharge plan needs to be updated    Co-evaluation                  AM-PAC PT "6 Clicks" Daily Activity     Outcome Measure   Help from another person eating meals?: None Help from another person taking care of personal grooming?: A Little Help from another person toileting, which includes using toliet, bedpan, or urinal?: A Lot Help from another person bathing (including washing, rinsing, drying)?: A Lot Help from another person to put on and taking off regular upper body clothing?: A Little Help from another person to put on and taking off regular lower body clothing?: A Lot 6 Click Score: 16    End of Session Equipment Utilized During Treatment: Gait belt;Rolling walker;Oxygen  OT Visit Diagnosis: Unsteadiness on feet (R26.81);Muscle weakness (generalized) (M62.81);Pain   Activity Tolerance Patient tolerated treatment well   Patient Left in chair;with call bell/phone within reach   Nurse Communication Other (comment) (NT aware pure wick is out, pt is incontinent of bowel)        Time: 1130-1206 OT Time Calculation (min): 36 min  Charges: OT General Charges $OT Visit: 1 Visit OT Treatments $Self Care/Home Management : 23-37 mins     Malka So 01/21/2017, 1:29 PM  01/21/2017 [name], OTR/L Pager: [phone]

## 2017-01-21 NOTE — Progress Notes (Signed)
Progress Note  Patient Name: Valerie Terrell Date of Encounter: 01/21/2017  Primary Cardiologist: Dr. Gwenlyn Found  Subjective   Pt is feeling better today, much less drowsy, denies chest pain, states her breathing is improved.  Inpatient Medications    Scheduled Meds: . acetaminophen  1,000 mg Oral QHS  . amiodarone  200 mg Oral Daily  . amLODipine  5 mg Oral Daily  . calcium-vitamin D  1 tablet Oral BID  . carvedilol  25 mg Oral BID WC  . escitalopram  5 mg Oral QHS  . hydrALAZINE  100 mg Oral TID  . isosorbide mononitrate  60 mg Oral Daily  . mirtazapine  7.5 mg Oral QHS  . multivitamin with minerals  1 tablet Oral Once per day on Mon Wed Fri  . polyethylene glycol  17 g Oral Daily  . pravastatin  80 mg Oral q1800  . sodium chloride flush  3 mL Intravenous Q12H  . vitamin C  500 mg Oral Daily  . Warfarin - Pharmacist Dosing Inpatient   Does not apply q1800   Continuous Infusions: . sodium chloride    . furosemide     PRN Meds: sodium chloride, acetaminophen, albuterol, bisacodyl, hydrALAZINE, magnesium hydroxide, ondansetron (ZOFRAN) IV, sodium chloride flush, sodium phosphate   Vital Signs    Vitals:   01/20/17 1632 01/20/17 1928 01/21/17 0605 01/21/17 1052  BP: (!) 128/53 (!) 124/48 (!) 154/54 (!) 143/53  Pulse:  (!) 59 (!) 59 60  Resp:  20 18   Temp:  98.4 F (36.9 C) 98.4 F (36.9 C)   TempSrc:  Oral Oral   SpO2:   96%   Weight:   116 lb 8 oz (52.8 kg)   Height:        Intake/Output Summary (Last 24 hours) at 01/21/17 1127 Last data filed at 01/21/17 0159  Gross per 24 hour  Intake              302 ml  Output              400 ml  Net              -98 ml   Filed Weights   01/19/17 0500 01/20/17 0542 01/21/17 0605  Weight: 109 lb 5.6 oz (49.6 kg) 118 lb (53.5 kg) 116 lb 8 oz (52.8 kg)     Physical Exam   General: Well developed, well nourished, female appearing in no acute distress. Head: Normocephalic, atraumatic.  Neck: Supple without bruits, no  JVD. Lungs:  Resp regular and unlabored, CTA in upper lobes, scattered crackles at bases Heart: RRR, S1, S2, no S3, S4, or murmur; no rub. Abdomen: Soft, non-tender, non-distended with normoactive bowel sounds. No hepatomegaly. No rebound/guarding. No obvious abdominal masses. Extremities: No clubbing, cyanosis, 1+ edema. Distal pedal pulses are 2+ bilaterally. Neuro: Alert and oriented X 3. Moves all extremities spontaneously. Psych: Normal affect.  Labs    Chemistry Recent Labs Lab 01/15/17 [phone]  01/17/17 0759 01/19/17 0640 01/20/17 0249 01/21/17 0424  NA 137  < > 132* 131* 128* 127*  K 4.0  < > 4.2 3.9 4.0 3.9  CL 96*  < > 91* 89* 89* 87*  CO2 32  < > 29 31 28 30   GLUCOSE 114*  < > 106* 98 105* 101*  BUN 56*  < > 58* 66* 66* 64*  CREATININE 2.23*  < > 2.47* 2.94* 2.98* 2.76*  CALCIUM 9.5  < > 9.4 8.7* 8.3* 8.0*  PROT  --   --  6.4*  --   --  6.0*  ALBUMIN 3.1*  --  3.1*  --   --  2.9*  AST  --   --  23  --   --  22  ALT  --   --  18  --   --  16  ALKPHOS  --   --  52  --   --  64  BILITOT  --   --  0.4  --   --  0.7  GFRNONAA 19*  < > 17* 14* 14* 15*  GFRAA 23*  < > 20* 16* 16* 17*  ANIONGAP 9  < > 12 11 11 10   < > = values in this interval not displayed.   Hematology Recent Labs Lab 01/17/17 0801 01/19/17 0640 01/21/17 0424  WBC 11.8* 10.0 8.7  RBC 2.85* 2.78* 2.55*  HGB 8.3* 8.0* 7.5*  HCT 27.1* 26.3* 23.7*  MCV 95.1 94.6 92.9  MCH 29.1 28.8 29.4  MCHC 30.6 30.4 31.6  RDW 15.6* 15.8* 16.0*  PLT 251 269 244    Cardiac EnzymesNo results for input(s): TROPONINI in the last 168 hours. No results for input(s): TROPIPOC in the last 168 hours.   BNPNo results for input(s): BNP, PROBNP in the last 168 hours.   DDimer No results for input(s): DDIMER in the last 168 hours.   Radiology    Dg Chest Port 1 View  Result Date: 01/20/2017 CLINICAL DATA:  CHF, history of previous CVA, stage IV chronic renal insufficiency, CHF, nonsmoker. EXAM: PORTABLE CHEST 1 VIEW  COMPARISON:  PA and lateral chest x-ray of January 16, 2017 FINDINGS: The lungs are reasonably well inflated. The interstitial markings are increased and the pulmonary vascularity is engorged. The cardiac silhouette remains enlarged. The retrocardiac region is slightly more dense today. There is calcification in the wall of the aortic arch. The ICD is in stable position. The patient has undergone kyphoplasty of approximately T8 in the past. There are old right posterior rib fractures. IMPRESSION: CHF with mild interstitial edema. Left lower lobe atelectasis and probable left pleural effusion. The ICD is in stable position. Thoracic aortic atherosclerosis. Electronically Signed   By: David  Martinique M.D.   On: 01/20/2017 13:30     Telemetry    A-paced - Personally Reviewed  ECG    No new tracings - Personally Reviewed   Cardiac Studies   Echocardiogram 12/02/16: Study Conclusions - Left ventricle: The cavity size was normal. There was moderate concentric hypertrophy. Systolic function was moderately reduced. The estimated ejection fraction was in the range of 35% to 40%. Wall motion was normal; there were no regional wall motion abnormalities. The study was not technically sufficient to allow evaluation of LV diastolic dysfunction due to atrial fibrillation. - Aortic valve: Valve mobility was restricted. Transvalvular velocity was within the normal range. There was no stenosis. There was no regurgitation. - Mitral valve: There was mild regurgitation. - Left atrium: The atrium was mildly dilated. - Right ventricle: The cavity size was moderately dilated. Wall thickness was normal. Systolic function was moderately reduced. - Tricuspid valve: There was mild regurgitation. - Pulmonary arteries: Systolic pressure was moderately increased. PA peak pressure: 47 mm Hg (S). - Inferior vena cava: The vessel was normal in size. - Pericardium, extracardiac: A trivial  pericardial effusion was identified posterior to the heart. Features were not consistent with tamponade physiology.  Impressions: - When compared to the prior study from 07/14/2016  LVEF has decreased from 60-65% to 35-40%. RV is moderately dilated with moderately decreased RVEF. There is RV strain. Consider workup for pulmonary embolism. The study acquired while the patient in atrial fibrillation with RVR.   Patient Profile     83 y.o. female with acute on chronic systolic HF, NICM, PAF, Pacer, CKD  Assessment & Plan    1. Acute on chronic systolic heart failure - nephrology consulted and increased lasix dose, creatinine now trending down - defer fluid balance to nephrology - she is overall net negative 3L, but refused the purewick so unclear if urine capture is accurate  2. Paroxysmal Afib - amiodarone 200 mg daily - continue coumadin - INR 2.26, per pharmacy - atrial pacing on telemetry - This patients CHA2DS2-VASc Score and unadjusted Ischemic Stroke Rate (% per year) is equal to 7.2 % stroke rate/year from a score of 5 (CHF, HTN, age, female).  3. Acute on chronic kidney disease stage IV - creatinine now improving - per nephrology - no HD needed yet  4. Hx of tachybrady s/p PPM - pacing on telemetry   Signed, Ledora Bottcher , [place] 11:27 AM 01/21/2017 Pager: [phone]  Personally seen and examined. Agree with above.  Improved with IV lasix, continue. 3L out. Dose adjustment by Dr. Florene Glen noted.  Sitting on comode, Lungs with mild decrease BS at bases, RRR, alert  Acute on chronic systolic HF Breathing improved If transfusion is needed, make sure to combine with lasix.  Amio for AFIB - NSR. Stable

## 2017-01-21 NOTE — Progress Notes (Signed)
Initial Nutrition Assessment  DOCUMENTATION CODES:   Non-severe (moderate) malnutrition in context of chronic illness  INTERVENTION:   -Carn[employer] Instant Breakfast TID between meals  NUTRITION DIAGNOSIS:   Malnutrition (Moderate) related to chronic illness as evidenced by percent weight loss, mild depletion of body fat, moderate depletions of muscle mass, mild fluid accumulation.   GOAL:   Patient will meet greater than or equal to 90% of their needs   MONITOR:   PO intake, Supplement acceptance, Labs, Weight trends  REASON FOR ASSESSMENT:   Consult Assessment of nutrition requirement/status (wt loss)  ASSESSMENT:   83 yo female admitted with acute on chronic respiratory failure, fall with rib fracture, acute on chronic CHF. Pt with hx of CHF, CKD IV, CAD, afib  Pt reports appetite is poor, eating better at home. Pt reports she typically eats 3 small meals per day and drinks 1-2 Carnation Instant Breakfast shakes per day. Breakfast is typically cereal or danish, lunch and dinner is shrimp/scallops/veggies.  Recorded po intake 65% of meals  Pt reports 40 pound weight loss in past year. Pt reports recent dry weight of 105 pounds. 27.5% wt loss in 1 year which is significant for time frame. Current wt 116 pounds.   Nutrition-Focused physical exam completed. Findings are mild/moderate fat depletion, mild/moderate muscle depletion, and no edema.   Labs: sodium 127, potassium wdl, Creatinine 2.76, BUN 64 Meds: remeron, MVI, vitamin C, folic acid  Diet Order:  Diet Heart Room service appropriate? Yes; Fluid consistency: Thin; Fluid restriction: 1500 mL Fluid  Skin:  Reviewed, no issues  Last BM:  9/5  Height:   Ht Readings from Last 1 Encounters:  01/11/17 4\' 11"  (1.499 m)    Weight:   Wt Readings from Last 1 Encounters:  01/21/17 116 lb 8 oz (52.8 kg)    Ideal Body Weight:     BMI:  Body mass index is 23.53 kg/m.  Estimated Nutritional Needs:   Kcal:   1660-6301 kcals  Protein:  66-74 g  Fluid:  >/= 1.3 L  EDUCATION NEEDS:   No education needs identified at this time  [place], [place], LDN [phone] Pager  [phone] Weekend/On-Call Pager

## 2017-01-21 NOTE — Progress Notes (Signed)
ANTICOAGULATION CONSULT NOTE - Follow Up Consult  Pharmacy Consult for Coumadin Indication: atrial fibrillation  Allergies  Allergen Reactions  . Clonidine Derivatives Palpitations and Other (See Comments)    Very sedated  . Codeine Nausea Only  . Nickel Rash  . Sulfa Antibiotics Other (See Comments)    Reaction unknown   Patient Measurements: Height: 4\' 11"  (149.9 cm) Weight: 116 lb 8 oz (52.8 kg) IBW/kg (Calculated) : 43.2  Vital Signs: Temp: 98.4 F (36.9 C) (09/06 0605) Temp Source: Oral (09/06 0605) BP: 154/54 (09/06 0605) Pulse Rate: 59 (09/06 0605)  Labs:  Recent Labs  01/19/17 0640 01/20/17 0249 01/21/17 0424  HGB 8.0*  --  7.5*  HCT 26.3*  --  23.7*  PLT 269  --  244  LABPROT 35.0* 31.4* 28.1*  INR 3.51 3.06 2.66  CREATININE 2.94* 2.98* 2.76*   Estimated Creatinine Clearance: 11.9 mL/min (A) (by C-G formula based on SCr of 2.76 mg/dL (H)).  Assessment:  83 yr old female on Coumadin for afib.  INR down to  2.66 after being 3.51 two days ago.  Warfarin held since 9/2. HgB 7.5 low at baseline down slightly from last CBC. No bleeding reported or documented. The INR increase is likely due to an interaction between warfarin and levafloxacin. The patient was on levafloxacin from 9/1 - 9/5. Patient has poor renal function and producing minimal urine.  Home Coumadin regimen: 3 mg TTSS, 1.5 mg MWF  Goal of Therapy:  INR 2-3 Monitor platelets by anticoagulation protocol: Yes   Plan:  Warfarin 2.5mg  now  Daily PT/INR Monitor for s/sx of bleeding  Georga Bora, PharmD Clinical Pharmacist 01/21/2017 10:37 AM

## 2017-01-22 LAB — CBC
HCT: 24.3 % — ABNORMAL LOW (ref 36.0–46.0)
HEMOGLOBIN: 7.6 g/dL — AB (ref 12.0–15.0)
MCH: 29.2 pg (ref 26.0–34.0)
MCHC: 31.3 g/dL (ref 30.0–36.0)
MCV: 93.5 fL (ref 78.0–100.0)
PLATELETS: 266 10*3/uL (ref 150–400)
RBC: 2.6 MIL/uL — AB (ref 3.87–5.11)
RDW: 16.1 % — AB (ref 11.5–15.5)
WBC: 10 10*3/uL (ref 4.0–10.5)

## 2017-01-22 LAB — BASIC METABOLIC PANEL
Anion gap: 9 (ref 5–15)
BUN: 67 mg/dL — ABNORMAL HIGH (ref 6–20)
CALCIUM: 7.7 mg/dL — AB (ref 8.9–10.3)
CO2: 30 mmol/L (ref 22–32)
CREATININE: 2.64 mg/dL — AB (ref 0.44–1.00)
Chloride: 89 mmol/L — ABNORMAL LOW (ref 101–111)
GFR, EST AFRICAN AMERICAN: 18 mL/min — AB (ref >60)
GFR, EST NON AFRICAN AMERICAN: 16 mL/min — AB (ref >60)
Glucose, Bld: 108 mg/dL — ABNORMAL HIGH (ref 65–99)
Potassium: 3.3 mmol/L — ABNORMAL LOW (ref 3.5–5.1)
Sodium: 128 mmol/L — ABNORMAL LOW (ref 135–145)

## 2017-01-22 LAB — PROTIME-INR
INR: 2.39
Prothrombin Time: 25.9 seconds — ABNORMAL HIGH (ref 11.4–15.2)

## 2017-01-22 LAB — OCCULT BLOOD X 1 CARD TO LAB, STOOL: FECAL OCCULT BLD: NEGATIVE

## 2017-01-22 MED ORDER — POTASSIUM CHLORIDE CRYS ER 20 MEQ PO TBCR
40.0000 meq | EXTENDED_RELEASE_TABLET | Freq: Once | ORAL | Status: AC
  Administered 2017-01-22: 40 meq via ORAL
  Filled 2017-01-22: qty 2

## 2017-01-22 MED ORDER — POTASSIUM CHLORIDE 20 MEQ PO PACK
40.0000 meq | PACK | Freq: Once | ORAL | Status: AC
  Administered 2017-01-22: 40 meq via ORAL
  Filled 2017-01-22: qty 2

## 2017-01-22 MED ORDER — MAGIC MOUTHWASH
5.0000 mL | Freq: Three times a day (TID) | ORAL | Status: AC
  Administered 2017-01-22 – 2017-01-28 (×19): 5 mL via ORAL
  Filled 2017-01-22 (×21): qty 5

## 2017-01-22 MED ORDER — WARFARIN SODIUM 2 MG PO TABS: 1.0000 mg | ORAL_TABLET | Freq: Once | ORAL | Status: DC

## 2017-01-22 MED ORDER — FUROSEMIDE 10 MG/ML IJ SOLN
80.0000 mg | Freq: Three times a day (TID) | INTRAMUSCULAR | Status: DC
  Administered 2017-01-22 – 2017-01-25 (×9): 80 mg via INTRAVENOUS
  Filled 2017-01-22 (×9): qty 8

## 2017-01-22 NOTE — Progress Notes (Signed)
Progress Note  Patient Name: Valerie Terrell Date of Encounter: 01/22/2017  Primary Cardiologist: Dr. Gwenlyn Found  Subjective   Pt is alert this morning. She is still suffering from stool incontinence since receive the laxatives. She denies chest pain and palpitations.  Inpatient Medications    Scheduled Meds: . acetaminophen  1,000 mg Oral QHS  . amiodarone  200 mg Oral Daily  . amLODipine  5 mg Oral Daily  . calcium-vitamin D  1 tablet Oral BID  . carvedilol  25 mg Oral BID WC  . escitalopram  5 mg Oral QHS  . folic acid  1 mg Oral Daily  . hydrALAZINE  100 mg Oral TID  . isosorbide mononitrate  60 mg Oral Daily  . mirtazapine  7.5 mg Oral QHS  . multivitamin with minerals  1 tablet Oral Once per day on Mon Wed Fri  . polyethylene glycol  17 g Oral Daily  . potassium chloride  40 mEq Oral Once  . pravastatin  80 mg Oral q1800  . sodium chloride flush  3 mL Intravenous Q12H  . vitamin C  500 mg Oral Daily  . warfarin  1 mg Oral ONCE-1800  . Warfarin - Pharmacist Dosing Inpatient   Does not apply q1800   Continuous Infusions: . sodium chloride    . furosemide Stopped (01/21/17 2328)   PRN Meds: sodium chloride, acetaminophen, albuterol, bisacodyl, hydrALAZINE, magnesium hydroxide, ondansetron (ZOFRAN) IV, sodium chloride flush, sodium phosphate   Vital Signs    Vitals:   01/21/17 1052 01/21/17 1929 01/21/17 2226 01/22/17 0408  BP: (!) 143/53 134/78 (!) 156/56 (!) 158/47  Pulse: 60 (!) 59  60  Resp:  18  18  Temp:  97.7 F (36.5 C)  98 F (36.7 C)  TempSrc:  Oral  Oral  SpO2:  96%  96%  Weight:    118 lb (53.5 kg)  Height:        Intake/Output Summary (Last 24 hours) at 01/22/17 1032 Last data filed at 01/22/17 0900  Gross per 24 hour  Intake              363 ml  Output              800 ml  Net             -437 ml   Filed Weights   01/20/17 0542 01/21/17 0605 01/22/17 0408  Weight: 118 lb (53.5 kg) 116 lb 8 oz (52.8 kg) 118 lb (53.5 kg)     Physical Exam     General: Well developed, well nourished, female appearing in no acute distress. Head: Normocephalic, atraumatic.  Neck: Supple without bruits, + JVD Lungs:  Resp regular and unlabored, CTA. Heart: RRR S1, S2, no S3, S4, or murmur; no rub. PPM pocket intact Abdomen: Soft, non-tender, non-distended with normoactive bowel sounds. No hepatomegaly. No rebound/guarding. No obvious abdominal masses. Extremities: No clubbing, cyanosis, 1+ edema. Distal pedal pulses are 2+ bilaterally. Neuro: Alert and oriented X 3. Moves all extremities spontaneously. Psych: Normal affect.  Labs    Chemistry Recent Labs Lab 01/17/17 0759  01/20/17 0249 01/21/17 0424 01/22/17 0441  NA 132*  < > 128* 127* 128*  K 4.2  < > 4.0 3.9 3.3*  CL 91*  < > 89* 87* 89*  CO2 29  < > 28 30 30   GLUCOSE 106*  < > 105* 101* 108*  BUN 58*  < > 66* 64* 67*  CREATININE 2.47*  < >  2.98* 2.76* 2.64*  CALCIUM 9.4  < > 8.3* 8.0* 7.7*  PROT 6.4*  --   --  6.0*  --   ALBUMIN 3.1*  --   --  2.9*  --   AST 23  --   --  22  --   ALT 18  --   --  16  --   ALKPHOS 52  --   --  64  --   BILITOT 0.4  --   --  0.7  --   GFRNONAA 17*  < > 14* 15* 16*  GFRAA 20*  < > 16* 17* 18*  ANIONGAP 12  < > 11 10 9   < > = values in this interval not displayed.   Hematology Recent Labs Lab 01/19/17 0640 01/21/17 0424 01/22/17 0441  WBC 10.0 8.7 10.0  RBC 2.78* 2.55* 2.60*  HGB 8.0* 7.5* 7.6*  HCT 26.3* 23.7* 24.3*  MCV 94.6 92.9 93.5  MCH 28.8 29.4 29.2  MCHC 30.4 31.6 31.3  RDW 15.8* 16.0* 16.1*  PLT 269 244 266    Cardiac EnzymesNo results for input(s): TROPONINI in the last 168 hours. No results for input(s): TROPIPOC in the last 168 hours.   BNPNo results for input(s): BNP, PROBNP in the last 168 hours.   DDimer No results for input(s): DDIMER in the last 168 hours.   Radiology    Dg Chest Port 1 View  Result Date: 01/20/2017 CLINICAL DATA:  CHF, history of previous CVA, stage IV chronic renal insufficiency, CHF,  nonsmoker. EXAM: PORTABLE CHEST 1 VIEW COMPARISON:  PA and lateral chest x-ray of January 16, 2017 FINDINGS: The lungs are reasonably well inflated. The interstitial markings are increased and the pulmonary vascularity is engorged. The cardiac silhouette remains enlarged. The retrocardiac region is slightly more dense today. There is calcification in the wall of the aortic arch. The ICD is in stable position. The patient has undergone kyphoplasty of approximately T8 in the past. There are old right posterior rib fractures. IMPRESSION: CHF with mild interstitial edema. Left lower lobe atelectasis and probable left pleural effusion. The ICD is in stable position. Thoracic aortic atherosclerosis. Electronically Signed   By: David  Martinique M.D.   On: 01/20/2017 13:30     Telemetry    A-paced - Personally Reviewed  ECG    No new tracings - Personally Reviewed   Cardiac Studies   Echocardiogram 12/02/16: Study Conclusions - Left ventricle: The cavity size was normal. There was moderate concentric hypertrophy. Systolic function was moderately reduced. The estimated ejection fraction was in the range of 35% to 40%. Wall motion was normal; there were no regional wall motion abnormalities. The study was not technically sufficient to allow evaluation of LV diastolic dysfunction due to atrial fibrillation. - Aortic valve: Valve mobility was restricted. Transvalvular velocity was within the normal range. There was no stenosis. There was no regurgitation. - Mitral valve: There was mild regurgitation. - Left atrium: The atrium was mildly dilated. - Right ventricle: The cavity size was moderately dilated. Wall thickness was normal. Systolic function was moderately reduced. - Tricuspid valve: There was mild regurgitation. - Pulmonary arteries: Systolic pressure was moderately increased. PA peak pressure: 47 mm Hg (S). - Inferior vena cava: The vessel was normal in size. -  Pericardium, extracardiac: A trivial pericardial effusion was identified posterior to the heart. Features were not consistent with tamponade physiology.  Impressions: - When compared to the prior study from 07/14/2016 LVEF has decreased from 60-65% to  35-40%. RV is moderately dilated with moderately decreased RVEF. There is RV strain. Consider workup for pulmonary embolism. The study acquired while the patient in atrial fibrillation with RVR.  Patient Profile     83 y.o. female with acute on chronic systolic HF, NICM, PAF, Pacer, CKD  Assessment & Plan    1. Acute on chronic systolic heart failure - fluid balance per nephrology - she is overall net negative 3.6, with moderate urine output - hypokalemia - replace per nephrology   2. Paroxysmal Afib - continue amiodarone and coumadin - INR 2.39 - A-pacing on telemetry   3. CKD stage IV - sCr continues to improve with lasix regimen - sCr  2.64 (2.76) with moderate urine output - per nephrology   4. Anemia - Hb improving to 7.6 (7.5)   Pt will likely discharge to SNF for extra rehab. Spoke with pt and daughter about compression socks at SNF.   Signed, Ledora Bottcher , PA-C 10:32 AM 01/22/2017 Pager: [phone]  Personally seen and examined. Agree with above.  Overall no CP, palpitation  Systolic HF  - Lasix adjusted per nephrology  - improved  - PO dose per nephrology  PAF  - amio 200 QD  - coreg 25 BID  - controlled  - warfarin  CKD 4  - single kidney  - per nephro and primary team  No new recs at this time. Will sign off.  Please call if ? Candee Furbish, MD

## 2017-01-22 NOTE — Clinical Social Work Note (Signed)
Patient and her daughter have chosen [facility]. CSW spoke with their hospital liaison and she will try to meet with patient and her daughter later this afternoon. She will call patient's daughter when she is on the way.  Dayton Scrape, [place]

## 2017-01-22 NOTE — Progress Notes (Signed)
ANTICOAGULATION CONSULT NOTE - Follow Up Consult  Pharmacy Consult for Coumadin Indication: atrial fibrillation  Allergies  Allergen Reactions  . Clonidine Derivatives Palpitations and Other (See Comments)    Very sedated  . Codeine Nausea Only  . Nickel Rash  . Sulfa Antibiotics Other (See Comments)    Reaction unknown   Patient Measurements: Height: 4\' 11"  (149.9 cm) Weight: 118 lb (53.5 kg) (scale c) IBW/kg (Calculated) : 43.2  Vital Signs: Temp: 98 F (36.7 C) (09/07 0408) Temp Source: Oral (09/07 0408) BP: 158/47 (09/07 0408) Pulse Rate: 60 (09/07 0408)  Labs:  Recent Labs  01/20/17 0249 01/21/17 0424 01/22/17 0441  HGB  --  7.5* 7.6*  HCT  --  23.7* 24.3*  PLT  --  244 266  LABPROT 31.4* 28.1* 25.9*  INR 3.06 2.66 2.39  CREATININE 2.98* 2.76* 2.64*   Estimated Creatinine Clearance: 12.5 mL/min (A) (by C-G formula based on SCr of 2.64 mg/dL (H)).  Assessment:  83 yr old female on Coumadin for afib.  INR down to 2.39 after being 3.51 three days ago.  Warfarin held held for three days. HgB 7.6 low at baseline. No bleeding reported or documented. Amiodarone and levofloxacin both have drug-drug interactions with warfarin. The patient was on levafloxacin from 9/1 - 9/5. Patient has poor renal function and producing minimal urine. Patient remains on amiodarone. Of note the provider has ordered stool samples to check for occult blood.  Home Coumadin regimen: 3 mg TTSS, 1.5 mg MWF  Goal of Therapy:  INR 2-3 Monitor platelets by anticoagulation protocol: Yes   Plan:  Warfarin 1mg  PO tonight Daily PT/INR Monitor for s/sx of bleeding  Georga Bora, PharmD Clinical Pharmacist 01/22/2017 10:28 AM

## 2017-01-22 NOTE — Procedures (Signed)
Assessment:  1 CKD3 with acute exacerbation, hemodynamically mediated 2 Solitary functioningright kidney 3 CHF/ volume overload 4 Afib/warfarin 5 Oral mucositis 6 Low K  Plan: 1 Slight diuretic dosage reduction done again, MMWash, K replace  Subjective: Interval History: Sore mouth  Objective: Vital signs in last 24 hours: Temp:  [97.7 F (36.5 C)-98 F (36.7 C)] 98 F (36.7 C) (09/07 0408) Pulse Rate:  [59-62] 62 (09/07 1131) Resp:  [18] 18 (09/07 0408) BP: (134-158)/(47-78) 150/51 (09/07 1131) SpO2:  [96 %] 96 % (09/07 0408) Weight:  [53.5 kg (118 lb)] 53.5 kg (118 lb) (09/07 0408) Weight change: 0.68 kg (1 lb 8 oz)  Intake/Output from previous day: 09/06 0701 - 09/07 0700 In: 123 [I.V.:3; IV Piggyback:120] Out: 800 [Urine:800] Intake/Output this shift: Total I/O In: 240 [P.O.:240] Out: -   General appearance: alert and cooperative Resp: diminished breath sounds bibasilar Chest wall: no tenderness Cardio: regular rate and rhythm, S1, S2 normal, no murmur, click, rub or gallop Extremities: edema 1+  Slight myoclonus  Lab Results:  Recent Labs  01/21/17 0424 01/22/17 0441  WBC 8.7 10.0  HGB 7.5* 7.6*  HCT 23.7* 24.3*  PLT 244 266   BMET:  Recent Labs  01/21/17 0424 01/22/17 0441  NA 127* 128*  K 3.9 3.3*  CL 87* 89*  CO2 30 30  GLUCOSE 101* 108*  BUN 64* 67*  CREATININE 2.76* 2.64*  CALCIUM 8.0* 7.7*   No results for input(s): PTH in the last 72 hours. Iron Studies: No results for input(s): IRON, TIBC, TRANSFERRIN, FERRITIN in the last 72 hours. Studies/Results: No results found.  Scheduled: . acetaminophen  1,000 mg Oral QHS  . amiodarone  200 mg Oral Daily  . amLODipine  5 mg Oral Daily  . calcium-vitamin D  1 tablet Oral BID  . carvedilol  25 mg Oral BID WC  . escitalopram  5 mg Oral QHS  . folic acid  1 mg Oral Daily  . furosemide  80 mg Intravenous TID  . hydrALAZINE  100 mg Oral TID  . isosorbide mononitrate  60 mg Oral  Daily  . magic mouthwash  5 mL Oral TID  . mirtazapine  7.5 mg Oral QHS  . multivitamin with minerals  1 tablet Oral Once per day on Mon Wed Fri  . polyethylene glycol  17 g Oral Daily  . potassium chloride  40 mEq Oral Once  . pravastatin  80 mg Oral q1800  . sodium chloride flush  3 mL Intravenous Q12H  . vitamin C  500 mg Oral Daily  . warfarin  1 mg Oral ONCE-1800  . Warfarin - Pharmacist Dosing Inpatient   Does not apply q1800    LOS: 11 days   Rilie Glanz C 01/22/2017,2:32 PM

## 2017-01-22 NOTE — Progress Notes (Signed)
Pts head was actively bleeding, took old dressing off and held pressure.  MD notified.  Bleeding stopped and pressure dressing applied.

## 2017-01-22 NOTE — Progress Notes (Signed)
PROGRESS NOTE  Valerie Terrell WUX:[id number] DOB: 1933/04/08 DOA: 01/10/2017 PCP: Tammi Sou, MD  HPI/Recap of past 24 hours:  Feeling better today, more awake and interactive,   urine output was not accurately documented  Assessment/Plan: Principal Problem:   Acute on chronic respiratory failure with hypoxia ([place]) Active Problems:   PAF (paroxysmal atrial fibrillation) (HCC)   Essential hypertension   CKD (chronic kidney disease), stage IV (HCC)   Acute combined systolic and diastolic heart failure (HCC) - possibly related to A. fib   Fracture of one rib, right side, initial encounter for closed fracture  Drowsiness/encephalopathy? -Patient was found to be very drowsy on 9/5 am, she falls back to sleep during conversation on 9/5 - No narcotic or sedative the night before or 9/5 am -Drowsiness likely multifactorial including worsening chf with poor brain perfusion, hypoxia, hyponatremia, worsening of bun/cr, delirium from FTT, prolonged hospitalization could also contribute -cxr with worsening of chf, stat ABG co2 upper normal limit with normal PH. She is started on higher dose of lasix  - improving    Acute on chronic hypoxic respiratory failure (home o2 3-4liters at baseline): - Chest x-ray on admission showed interstitial pulmonary edema, stable bilateral pleural effusion and possible left lower lobe atelectasis  -treating chf (cxr with Atelectasis versus left lower lobe pneumonia on admission: she received oral Levaquin x 8days. No fever. Continue to monitor off abx)  Acute on chronic systolic chf: -Nonischemic cardiomyopathy (no regional wall motion on echo, negative stress test),EF of 35-40% by echo in July. -chf Presumed from afib -she appear to have increased oxygen requirement and become more drowsy this am on 9/5, suspect worsening of chf, repeat  cxr on 9/5 confirmed chf, -patient is started back on iv lasix, cardiology and nephrology consulted.    Hyponatremia: from fluids overload?  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  Coumadin per pharmacy  Cardiology following  Acute kidney injury on Chronic kidney disease stage IV:  -solitary kidney (History of a nonfunctioning left kidney with mild right renal artery stenosis demonstrated by duplex ultrasound October 2017.) - bun/cr peaked on 9.5, start to improve on higher dose of lasix - nephrology Dr Florene Glen consulted, input appreciated - Avoid nephrotoxins. Monitor BMP.   Anemia , normocytic, no sign of bleed, last fobt in 11/2016 was negative, FOBT negative.  Low folate, start folate supplement,  iron/b12 adequate. tibili wnl. Calcium low Likely component of anemia of chronic disease Consider transfusion if hgb less than 7. Consider EPO , nephrology to decide.   Essential hypertension: currently on  amlodipine, Lasix, Coreg, hydralazine, Imdur  Undisplaced right ninth rib fracture without complicating feature, multiple old right rib fractures with healing: Pain management, incentive spirometer and supportive care. PT OT evaluation.  Addendum 7pm on 9/7 : Forehead abrasion: s/p suture. RN report started to bleed at around 7pm, advise manual pressure for a longer duration due to patient on coumadin, then pressure dressing, consider surgery consult if bleeding continues.  Generalized anxiety/depression:  Started Lexapro 5 mg daily at bedtime and mirtazapine 7.5 mg at bedtime by psychiatrist. Encourage oral intake. Mortise is stable today. Evaluated by psychiatrist.  FTT: this is her 5 hospitalizations this year, daughter report 40pounds of weight loss this year. Will get nutrition/PT/OT,  Condition not  improving,   SNF placement ( significant care giver exhaustion as well)  Body mass index is 23.83 kg/m.   DVT prophylaxis: Coumadin Code Status: Full code Family Communication: Patient's daughter at bedside Disposition Plan: not ready for discharge, need cardiology and nephrology clearance,   SNF placement at discharge .    Consultants:   Cardiology   Nephrology  psychiatry  Procedures: None Antimicrobials: Levaquin from admission to 9/5.    Objective: BP (!) 158/47 (BP Location: Left Arm)   Pulse 60   Temp 98 F (36.7 C) (Oral)   Resp 18   Ht 4\' 11"  (1.499 m)   Wt 53.5 kg (118 lb) Comment: scale c  SpO2 96%   BMI 23.83 kg/m   Intake/Output Summary (Last 24 hours) at 01/22/17 0824 Last data filed at 01/22/17 0414  Gross per 24 hour  Intake              123 ml  Output              800 ml  Net             -677 ml   Filed Weights   01/20/17 0542 01/21/17 0605 01/22/17 0408  Weight: 53.5 kg (118 lb) 52.8 kg (116 lb 8 oz) 53.5 kg (118 lb)    Exam: Patient is examined daily including today on 01/22/2017, exams remain the same as of yesterday except that has changed    General:  Frail, chronically ill appearing, more alert, oriented x3  Cardiovascular: paced rhythm  Respiratory: poor respiratory effort, crackles at basis, no wheezing, no rhonchi  Abdomen: Soft/ND/NT, positive BS  Musculoskeletal: No Edema  Neuro: more alert , oriented  x3  Data Reviewed: Basic Metabolic Panel:  Recent Labs Lab 01/17/17 0759 01/19/17 0640 01/20/17 0249 01/21/17 0424 01/22/17 0441  NA 132* 131* 128* 127* 128*  K 4.2 3.9 4.0 3.9 3.3*  CL 91* 89* 89* 87* 89*  CO2 29 31 28 30 30   GLUCOSE 106* 98 105* 101* 108*  BUN 58* 66* 66* 64* 67*  CREATININE 2.47* 2.94* 2.98* 2.76* 2.64*  CALCIUM 9.4 8.7* 8.3* 8.0* 7.7*  MG  --  2.2  --   --   --    Liver Function Tests:  Recent Labs Lab 01/17/17 0759 01/21/17 0424  AST 23 22  ALT 18 16  ALKPHOS 52 64  BILITOT 0.4 0.7   PROT 6.4* 6.0*  ALBUMIN 3.1* 2.9*   No results for input(s): LIPASE, AMYLASE in the last 168 hours.  Recent Labs Lab 01/21/17 0424  AMMONIA 11   CBC:  Recent Labs Lab 01/17/17 0801 01/19/17 0640 01/21/17 0424 01/22/17 0441  WBC 11.8* 10.0 8.7 10.0  NEUTROABS 9.8*  --   --   --   HGB 8.3* 8.0* 7.5* 7.6*  HCT 27.1* 26.3* 23.7* 24.3*  MCV 95.1 94.6 92.9 93.5  PLT 251 269 244 266   Cardiac Enzymes:   No results for input(s): CKTOTAL, CKMB, CKMBINDEX, TROPONINI in the last 168 hours. BNP (last 3 results)  Recent Labs  11/30/16 1331 12/14/16 2026 01/10/17 2203  BNP >4,500.0* 919.4* 1,996.8*    ProBNP (last 3 results) No results for input(s): PROBNP in the last 8760 hours.  CBG: No results for input(s): GLUCAP in the last 168 hours.  Recent Results (from the past 240 hour(s))  Culture, blood (routine x 2) Call MD if unable to obtain prior to antibiotics being given     Status: None  Collection Time: 01/13/17 12:55 PM  Result Value Ref Range Status   Specimen Description BLOOD LEFT HAND  Final   Special Requests IN PEDIATRIC BOTTLE Blood Culture adequate volume  Final   Culture NO GROWTH 5 DAYS  Final   Report Status 01/18/2017 FINAL  Final  Culture, blood (routine x 2) Call MD if unable to obtain prior to antibiotics being given     Status: None   Collection Time: 01/13/17 12:56 PM  Result Value Ref Range Status   Specimen Description BLOOD LEFT FOREARM  Final   Special Requests IN PEDIATRIC BOTTLE Blood Culture adequate volume  Final   Culture NO GROWTH 5 DAYS  Final   Report Status 01/18/2017 FINAL  Final     Studies: No results found.  Scheduled Meds: . acetaminophen  1,000 mg Oral QHS  . amiodarone  200 mg Oral Daily  . amLODipine  5 mg Oral Daily  . calcium-vitamin D  1 tablet Oral BID  . carvedilol  25 mg Oral BID WC  . escitalopram  5 mg Oral QHS  . folic acid  1 mg Oral Daily  . hydrALAZINE  100 mg Oral TID  . isosorbide mononitrate  60 mg  Oral Daily  . mirtazapine  7.5 mg Oral QHS  . multivitamin with minerals  1 tablet Oral Once per day on Mon Wed Fri  . polyethylene glycol  17 g Oral Daily  . potassium chloride  40 mEq Oral Once  . pravastatin  80 mg Oral q1800  . sodium chloride flush  3 mL Intravenous Q12H  . vitamin C  500 mg Oral Daily  . Warfarin - Pharmacist Dosing Inpatient   Does not apply q1800    Continuous Infusions: . sodium chloride    . furosemide Stopped (01/21/17 2328)     Time spent: 35 mins I have personally reviewed and interpreted on  01/22/2017 daily labs, tele strips, imagings as discussed above under data review session and assessment and plans.  I reviewed all nursing notes, pharmacy notes, consultant notes,  vitals, pertinent old records  I have discussed plan of care as described above with cardiology, RN , patient and daughter at bedside on 01/22/2017   Kelwin Gibler MD, PhD  [facility] Pager [phone]. If 7PM-7AM, please contact night-coverage at [email or web address], password [email or web address] 01/22/2017, 8:24 AM  LOS: 11 days

## 2017-01-22 NOTE — Progress Notes (Signed)
Pts daughter requesting to speak with MD.  MD paged.

## 2017-01-23 LAB — CBC
HCT: 26.4 % — ABNORMAL LOW (ref 36.0–46.0)
HEMOGLOBIN: 8.2 g/dL — AB (ref 12.0–15.0)
MCH: 29.2 pg (ref 26.0–34.0)
MCHC: 31.1 g/dL (ref 30.0–36.0)
MCV: 94 fL (ref 78.0–100.0)
PLATELETS: 258 10*3/uL (ref 150–400)
RBC: 2.81 MIL/uL — ABNORMAL LOW (ref 3.87–5.11)
RDW: 16.6 % — AB (ref 11.5–15.5)
WBC: 9.6 10*3/uL (ref 4.0–10.5)

## 2017-01-23 LAB — BASIC METABOLIC PANEL
ANION GAP: 10 (ref 5–15)
BUN: 61 mg/dL — AB (ref 6–20)
CHLORIDE: 91 mmol/L — AB (ref 101–111)
CO2: 30 mmol/L (ref 22–32)
Calcium: 8 mg/dL — ABNORMAL LOW (ref 8.9–10.3)
Creatinine, Ser: 2.43 mg/dL — ABNORMAL HIGH (ref 0.44–1.00)
GFR calc Af Amer: 20 mL/min — ABNORMAL LOW (ref >60)
GFR, EST NON AFRICAN AMERICAN: 18 mL/min — AB (ref >60)
GLUCOSE: 112 mg/dL — AB (ref 65–99)
POTASSIUM: 4.2 mmol/L (ref 3.5–5.1)
SODIUM: 131 mmol/L — AB (ref 135–145)

## 2017-01-23 LAB — PROTIME-INR
INR: 2.04
Prothrombin Time: 22.9 seconds — ABNORMAL HIGH (ref 11.4–15.2)

## 2017-01-23 MED ORDER — WARFARIN SODIUM 3 MG PO TABS
3.0000 mg | ORAL_TABLET | Freq: Once | ORAL | Status: AC
  Administered 2017-01-23: 3 mg via ORAL
  Filled 2017-01-23: qty 1

## 2017-01-23 NOTE — Progress Notes (Signed)
[name], female   DOB: Jul 13, 1932, 83 y.o.   MRN: [id number]   PROGRESS NOTE   Valerie Terrell  TGG:[id number] DOB: April 01, 1933 DOA: 01/10/2017  PCP: Tammi Sou, MD  Brief Narrative:  Pt is 83 yo female with known CKD stage III, solitary functional right kidney, admitted after an episodes of fall and was found to have CHF and rising Cr.   Assessment & Plan:  Drowsiness/encephalopathy? - Patient was found to be very drowsy on 9/5 am - No narcotic or sedative the night before or 9/5 am - improving and more alert this am  Acute on chronic hypoxic respiratory failure (home o2 3-4 liters at baseline), acute on chronic sCHF, LLL PNA - Chest x-ray on admission showed interstitial pulmonary edema, stable bilateral pleural effusion and possible left lower lobe atelectasis  - treating CHF as noted below with cardiology team assistance - pt has completed 8 days of Levaquin   Acute on chronic systolic CHF - Nonischemic cardiomyopathy (no regional wall motion on echo, negative stress test),EF of 35-40% by echo in July. - pt is still on Lasix 80 IV TID  - weight trend: Filed Weights   01/21/17 0605 01/22/17 0408 01/23/17 0405  Weight: 52.8 kg (116 lb 8 oz) 53.5 kg (118 lb) 52.8 kg (116 lb 4.8 oz)  - monitor daily weights, I/O  Hyponatremia - improving from 127 --> 131  - BMP in AM   Hypokalemia - supplemented and WNL  Paroxysmal atrial fibrillation with h/o tachy-brady s/p pacemaker placement in 11/2016, CHADS2-Vasc score 5 - H/o subarachnoid hemorrhage after syncope while on xarelto in 7035, h/o embolic cva while on anticoagulation - currently pace rhythm - continue Coreg and Amiodarone 200 mg PO QD - continue Coumadin  - cardiology team following   Acute kidney injury on Chronic kidney disease stage IV - solitary kidney (History of a nonfunctioning left kidney with mild right renal artery stenosis demonstrated by duplex ultrasound October 2017.) - Cr is  trending down overall, pt is on Lasix 80 IV TID  - nephrology team following - BMP in AM  Anemia, normocytic and suspect of chronic disease, malnutrition - no evidence of active bleeding - Hg is overall stable - CBC in AM - FOBT negative   Essential hypertension - on multiple medications: Imdur 60 mg PO QS, Hydralazine 100 mg TID, Coreg 25 mg PO BID, Norvasc 5 mg PO QD, Lasix 80 mg IV TID - BP is controlled   Undisplaced right ninth rib fracture without complicating feature, multiple old right rib fractures with healing - pt reports pain is controlled at this time  - IS while awake   Forehead abrasion 9/7 - s/p sutures, no further bleeding   Generalized anxiety/depression, FTT, moderate PCM - pt started on Lexapro 5 mg PO QD and Mirtazapine 7.5 mg PO QHS per psychiatrist  - pt so far tolerating well - this is pt's 5th hospitalization this year and > 40 lbs weight loss - nutritionist has been consulted and assistance appreciated  - PT/OT eval done and SNF recommended - SW consulted for assistance with placement   DVT prophylaxis: Warfarin  Code Status: full code  Family Communication: no family at bedside  Disposition Plan: will likely go to SNF once able to transition to PO Lasxi   Consultants:   Cardiology  Nephrology   Psychiatry   Procedures:  None  Antimicrobials:   Levaquin 8/26 --> 9/5  Subjective: Pt reports feeling better but still with exertional  dyspnea.   Objective: Vitals:   01/23/17 0405 01/23/17 0658 01/23/17 0932 01/23/17 1146  BP:  (!) 156/59 131/71 120/82  Pulse:  62  (!) 59  Resp:  18    Temp:  98 F (36.7 C)    TempSrc:  Oral    SpO2:  96%  99%  Weight: 52.8 kg (116 lb 4.8 oz)     Height:        Intake/Output Summary (Last 24 hours) at 01/23/17 1150 Last data filed at 01/23/17 1018  Gross per 24 hour  Intake              240 ml  Output              350 ml  Net             -110 ml   Filed Weights   01/21/17 0605 01/22/17  0408 01/23/17 0405  Weight: 52.8 kg (116 lb 8 oz) 53.5 kg (118 lb) 52.8 kg (116 lb 4.8 oz)    Examination:  General exam: Appears calm and comfortable, chronically ill, flat affect  Respiratory system: clear anteriorly, no wheezing  Cardiovascular system:  RRR. No JVD, murmurs, rubs, gallops or clicks. Gastrointestinal system: Abdomen is nondistended, soft and nontender. No organomegaly or masses felt. Normal bowel sounds heard. Central nervous system: Alert and oriented. No focal neurological deficits. Extremities: Symmetric 5 x 5 power.  Data Reviewed: I have personally reviewed following labs and imaging studies  CBC:  Recent Labs Lab 01/17/17 0801 01/19/17 0640 01/21/17 0424 01/22/17 0441 01/23/17 0728  WBC 11.8* 10.0 8.7 10.0 9.6  NEUTROABS 9.8*  --   --   --   --   HGB 8.3* 8.0* 7.5* 7.6* 8.2*  HCT 27.1* 26.3* 23.7* 24.3* 26.4*  MCV 95.1 94.6 92.9 93.5 94.0  PLT 251 269 244 266 650   Basic Metabolic Panel:  Recent Labs Lab 01/19/17 0640 01/20/17 0249 01/21/17 0424 01/22/17 0441 01/23/17 0245  NA 131* 128* 127* 128* 131*  K 3.9 4.0 3.9 3.3* 4.2  CL 89* 89* 87* 89* 91*  CO2 31 28 30 30 30   GLUCOSE 98 105* 101* 108* 112*  BUN 66* 66* 64* 67* 61*  CREATININE 2.94* 2.98* 2.76* 2.64* 2.43*  CALCIUM 8.7* 8.3* 8.0* 7.7* 8.0*  MG 2.2  --   --   --   --    Liver Function Tests:  Recent Labs Lab 01/17/17 0759 01/21/17 0424  AST 23 22  ALT 18 16  ALKPHOS 52 64  BILITOT 0.4 0.7  PROT 6.4* 6.0*  ALBUMIN 3.1* 2.9*    Recent Labs Lab 01/21/17 0424  AMMONIA 11   Coagulation Profile:  Recent Labs Lab 01/19/17 0640 01/20/17 0249 01/21/17 0424 01/22/17 0441 01/23/17 0245  INR 3.51 3.06 2.66 2.39 2.04   Urine analysis:    Component Value Date/Time   COLORURINE YELLOW 01/20/2017 1737   APPEARANCEUR CLEAR 01/20/2017 1737   LABSPEC 1.012 01/20/2017 1737   PHURINE 5.0 01/20/2017 1737   GLUCOSEU NEGATIVE 01/20/2017 1737   HGBUR NEGATIVE 01/20/2017  1737   BILIRUBINUR NEGATIVE 01/20/2017 1737   BILIRUBINUR negative 09/24/2016 [place] 01/20/2017 1737   PROTEINUR 30 (A) 01/20/2017 1737   UROBILINOGEN 0.2 09/24/2016 1453   NITRITE NEGATIVE 01/20/2017 1737   LEUKOCYTESUR NEGATIVE 01/20/2017 1737   Recent Results (from the past 240 hour(s))  Culture, blood (routine x 2) Call MD if unable to obtain prior to antibiotics being given  Status: None   Collection Time: 01/13/17 12:55 PM  Result Value Ref Range Status   Specimen Description BLOOD LEFT HAND  Final   Special Requests IN PEDIATRIC BOTTLE Blood Culture adequate volume  Final   Culture NO GROWTH 5 DAYS  Final   Report Status 01/18/2017 FINAL  Final  Culture, blood (routine x 2) Call MD if unable to obtain prior to antibiotics being given     Status: None   Collection Time: 01/13/17 12:56 PM  Result Value Ref Range Status   Specimen Description BLOOD LEFT FOREARM  Final   Special Requests IN PEDIATRIC BOTTLE Blood Culture adequate volume  Final   Culture NO GROWTH 5 DAYS  Final   Report Status 01/18/2017 FINAL  Final    Radiology Studies: Dg Chest Port 1 View  Result Date: 01/20/2017 CLINICAL DATA:  CHF, history of previous CVA, stage IV chronic renal insufficiency, CHF, nonsmoker. EXAM: PORTABLE CHEST 1 VIEW COMPARISON:  PA and lateral chest x-ray of January 16, 2017 FINDINGS: The lungs are reasonably well inflated. The interstitial markings are increased and the pulmonary vascularity is engorged. The cardiac silhouette remains enlarged. The retrocardiac region is slightly more dense today. There is calcification in the wall of the aortic arch. The ICD is in stable position. The patient has undergone kyphoplasty of approximately T8 in the past. There are old right posterior rib fractures. IMPRESSION: CHF with mild interstitial edema. Left lower lobe atelectasis and probable left pleural effusion. The ICD is in stable position. Thoracic aortic atherosclerosis.  Electronically Signed   By: David  Martinique M.D.   On: 01/20/2017 13:30   Scheduled Meds: . acetaminophen  1,000 mg Oral QHS  . amiodarone  200 mg Oral Daily  . amLODipine  5 mg Oral Daily  . calcium-vitamin D  1 tablet Oral BID  . carvedilol  25 mg Oral BID WC  . escitalopram  5 mg Oral QHS  . folic acid  1 mg Oral Daily  . furosemide  80 mg Intravenous TID  . hydrALAZINE  100 mg Oral TID  . isosorbide mononitrate  60 mg Oral Daily  . magic mouthwash  5 mL Oral TID  . mirtazapine  7.5 mg Oral QHS  . multivitamin with minerals  1 tablet Oral Once per day on Mon Wed Fri  . polyethylene glycol  17 g Oral Daily  . pravastatin  80 mg Oral q1800  . sodium chloride flush  3 mL Intravenous Q12H  . vitamin C  500 mg Oral Daily  . Warfarin - Pharmacist Dosing Inpatient   Does not apply q1800   Continuous Infusions: . sodium chloride      LOS: 12 days   Time spent: 25 minutes  Greater than 50% of the time spent on counseling and coordinating the care.  Leisa Lenz, MD [facility] Pager [phone]  If 7PM-7AM, please contact night-coverage [email or web address] Password [email or web address] 01/23/2017, 11:50 AM

## 2017-01-23 NOTE — Progress Notes (Addendum)
ANTICOAGULATION CONSULT NOTE - Follow Up Consult  Pharmacy Consult for Coumadin Indication: atrial fibrillation  Allergies  Allergen Reactions  . Clonidine Derivatives Palpitations and Other (See Comments)    Very sedated  . Codeine Nausea Only  . Nickel Rash  . Sulfa Antibiotics Other (See Comments)    Reaction unknown   Patient Measurements: Height: 4\' 11"  (149.9 cm) Weight: 116 lb 4.8 oz (52.8 kg) IBW/kg (Calculated) : 43.2  Vital Signs: Temp: 98 F (36.7 C) (09/08 0658) Temp Source: Oral (09/08 0658) BP: 131/71 (09/08 0932) Pulse Rate: 62 (09/08 0658)  Labs:  Recent Labs  01/21/17 0424 01/22/17 0441 01/23/17 0245 01/23/17 0728  HGB 7.5* 7.6*  --  8.2*  HCT 23.7* 24.3*  --  26.4*  PLT 244 266  --  258  LABPROT 28.1* 25.9* 22.9*  --   INR 2.66 2.39 2.04  --   CREATININE 2.76* 2.64* 2.43*  --    Estimated Creatinine Clearance: 13.5 mL/min (A) (by C-G formula based on SCr of 2.43 mg/dL (H)).  Assessment:  83 yr old female on Coumadin for afib. INR has been labile this admit - may have been due to DDI with levaquin - now d/c'd. Warfarin held held for three days 9/3-9/5 with supratherapeutic INR, then again 9/7 with reported active bleeding from head wound s/p fall. Per RN, no further bleeding or oozing from wound this AM.   INR down to 2.04 this AM after dose held. HgB low stable, plt wnl. Noted on amiodarone. FOBT negative on 9/7.  Home Coumadin regimen: 3 mg TTSS, 1.5 mg MWF  Goal of Therapy:  INR 2-3 Monitor platelets by anticoagulation protocol: Yes   Plan:  Warfarin 3mg  PO x 1 - confirmed with MD ok to give tonight with no active s/sx bleeding Daily PT/INR Monitor CBC, for s/sx of bleeding  Elicia Lamp, PharmD, BCPS Clinical Pharmacist Rx Phone # for today: [phone] After 3:30PM, please call Main Rx: #[id number] 01/23/2017 11:49 AM

## 2017-01-23 NOTE — Progress Notes (Signed)
Pt had an uneventful shift. OOB to the chair, No further bleeding noted from right frontal area. Cont with plan of care

## 2017-01-23 NOTE — Progress Notes (Signed)
Assessment:  1 CKD3 with acute exacerbation, hemodynamically mediated 2 Solitary functioningright kidney 3 CHF/ volume overload 4 Afib/warfarin 5 Oral mucositis, feels better 6 Low K-improved  Plan:    Cont furosemide IV at same dose   Subjective: Interval History: about the same  Objective: Vital signs in last 24 hours: Temp:  [98 F (36.7 C)-98.8 F (37.1 C)] 98 F (36.7 C) (09/08 0658) Pulse Rate:  [59-62] 62 (09/08 0658) Resp:  [18] 18 (09/08 0658) BP: (131-156)/(48-71) 131/71 (09/08 0932) SpO2:  [96 %-98 %] 96 % (09/08 0658) Weight:  [52.8 kg (116 lb 4.8 oz)] 52.8 kg (116 lb 4.8 oz) (09/08 0405) Weight change: -0.771 kg (-1 lb 11.2 oz)  Intake/Output from previous day: 09/07 0701 - 09/08 0700 In: 480 [P.O.:480] Out: 0  Intake/Output this shift: Total I/O In: -  Out: 350 [Urine:350]  General appearance: alert and cooperative Resp: few crackles Chest wall: no tenderness Cardio: irregularly irregular rhythm Extremities: edema 1+gen weak appearing  Lab Results:  Recent Labs  01/22/17 0441 01/23/17 0728  WBC 10.0 9.6  HGB 7.6* 8.2*  HCT 24.3* 26.4*  PLT 266 258   BMET:  Recent Labs  01/22/17 0441 01/23/17 0245  NA 128* 131*  K 3.3* 4.2  CL 89* 91*  CO2 30 30  GLUCOSE 108* 112*  BUN 67* 61*  CREATININE 2.64* 2.43*  CALCIUM 7.7* 8.0*   No results for input(s): PTH in the last 72 hours. Iron Studies: No results for input(s): IRON, TIBC, TRANSFERRIN, FERRITIN in the last 72 hours. Studies/Results: No results found.  Scheduled: . acetaminophen  1,000 mg Oral QHS  . amiodarone  200 mg Oral Daily  . amLODipine  5 mg Oral Daily  . calcium-vitamin D  1 tablet Oral BID  . carvedilol  25 mg Oral BID WC  . escitalopram  5 mg Oral QHS  . folic acid  1 mg Oral Daily  . furosemide  80 mg Intravenous TID  . hydrALAZINE  100 mg Oral TID  . isosorbide mononitrate  60 mg Oral Daily  . magic mouthwash  5 mL Oral TID  . mirtazapine  7.5 mg Oral QHS   . multivitamin with minerals  1 tablet Oral Once per day on Mon Wed Fri  . polyethylene glycol  17 g Oral Daily  . pravastatin  80 mg Oral q1800  . sodium chloride flush  3 mL Intravenous Q12H  . vitamin C  500 mg Oral Daily  . Warfarin - Pharmacist Dosing Inpatient   Does not apply q1800     LOS: 12 days   Valerie Terrell C 01/23/2017,11:24 AM

## 2017-01-24 LAB — BASIC METABOLIC PANEL
ANION GAP: 12 (ref 5–15)
BUN: 58 mg/dL — ABNORMAL HIGH (ref 6–20)
CALCIUM: 8.3 mg/dL — AB (ref 8.9–10.3)
CHLORIDE: 93 mmol/L — AB (ref 101–111)
CO2: 28 mmol/L (ref 22–32)
CREATININE: 2.44 mg/dL — AB (ref 0.44–1.00)
GFR, EST AFRICAN AMERICAN: 20 mL/min — AB (ref >60)
GFR, EST NON AFRICAN AMERICAN: 17 mL/min — AB (ref >60)
Glucose, Bld: 95 mg/dL (ref 65–99)
POTASSIUM: 3.4 mmol/L — AB (ref 3.5–5.1)
SODIUM: 133 mmol/L — AB (ref 135–145)

## 2017-01-24 LAB — CBC
HCT: 27.6 % — ABNORMAL LOW (ref 36.0–46.0)
HEMOGLOBIN: 8.5 g/dL — AB (ref 12.0–15.0)
MCH: 29.4 pg (ref 26.0–34.0)
MCHC: 30.8 g/dL (ref 30.0–36.0)
MCV: 95.5 fL (ref 78.0–100.0)
PLATELETS: 236 10*3/uL (ref 150–400)
RBC: 2.89 MIL/uL — AB (ref 3.87–5.11)
RDW: 17.2 % — ABNORMAL HIGH (ref 11.5–15.5)
WBC: 8.9 10*3/uL (ref 4.0–10.5)

## 2017-01-24 LAB — PROTIME-INR
INR: 1.95
PROTHROMBIN TIME: 22.1 s — AB (ref 11.4–15.2)

## 2017-01-24 MED ORDER — POTASSIUM CHLORIDE 20 MEQ PO PACK
20.0000 meq | PACK | Freq: Two times a day (BID) | ORAL | Status: AC
  Administered 2017-01-24: 20 meq via ORAL
  Filled 2017-01-24 (×2): qty 1

## 2017-01-24 MED ORDER — WARFARIN SODIUM 3 MG PO TABS
3.0000 mg | ORAL_TABLET | Freq: Once | ORAL | Status: AC
  Administered 2017-01-24: 3 mg via ORAL
  Filled 2017-01-24: qty 1

## 2017-01-24 MED ORDER — OXYCODONE-ACETAMINOPHEN 5-325 MG PO TABS
1.0000 | ORAL_TABLET | Freq: Four times a day (QID) | ORAL | Status: DC | PRN
  Administered 2017-01-27 – 2017-01-29 (×6): 1 via ORAL
  Administered 2017-01-30: 2 via ORAL
  Administered 2017-01-30: 1 via ORAL
  Administered 2017-01-31: 2 via ORAL
  Administered 2017-01-31 – 2017-02-01 (×4): 1 via ORAL
  Administered 2017-02-02: 2 via ORAL
  Administered 2017-02-02: 1 via ORAL
  Filled 2017-01-24: qty 1
  Filled 2017-01-24: qty 2
  Filled 2017-01-24 (×7): qty 1
  Filled 2017-01-24: qty 2
  Filled 2017-01-24 (×2): qty 1
  Filled 2017-01-24: qty 2
  Filled 2017-01-24: qty 1

## 2017-01-24 MED ORDER — POTASSIUM CHLORIDE CRYS ER 20 MEQ PO TBCR
40.0000 meq | EXTENDED_RELEASE_TABLET | Freq: Once | ORAL | Status: AC
  Administered 2017-01-24: 40 meq via ORAL
  Filled 2017-01-24: qty 2

## 2017-01-24 NOTE — Progress Notes (Signed)
Patient complains of sudden onset of shortness of breath. Daughter at bedside and Dr. Florene Glen. Patient on 4 litters of oxygen with stable vital signs. Plan per Dr. Florene Glen is to increase patient's lasix and continue to monitor.

## 2017-01-24 NOTE — Progress Notes (Signed)
No new orders per Dr.Devine. Dr. Florene Glen paged to make aware of decrease urinary output with lasix.

## 2017-01-24 NOTE — Progress Notes (Signed)
ANTICOAGULATION CONSULT NOTE - Follow Up Consult  Pharmacy Consult for Coumadin Indication: atrial fibrillation  Allergies  Allergen Reactions  . Clonidine Derivatives Palpitations and Other (See Comments)    Very sedated  . Codeine Nausea Only  . Nickel Rash  . Sulfa Antibiotics Other (See Comments)    Reaction unknown   Patient Measurements: Height: 4\' 11"  (149.9 cm) Weight: 116 lb 6.4 oz (52.8 kg) IBW/kg (Calculated) : 43.2  Vital Signs: Temp: 98.5 F (36.9 C) (09/09 1148) Temp Source: Oral (09/09 1148) BP: 161/49 (09/09 1148) Pulse Rate: 61 (09/09 1148)  Labs:  Recent Labs  01/22/17 0441 01/23/17 0245 01/23/17 0728 01/24/17 0523  HGB 7.6*  --  8.2* 8.5*  HCT 24.3*  --  26.4* 27.6*  PLT 266  --  258 236  LABPROT 25.9* 22.9*  --  22.1*  INR 2.39 2.04  --  1.95  CREATININE 2.64* 2.43*  --  2.44*   Estimated Creatinine Clearance: 13.4 mL/min (A) (by C-G formula based on SCr of 2.44 mg/dL (H)).  Assessment:  83 yr old female on Coumadin for afib. INR has been labile this admit - may have been due to DDI with levaquin - now d/c'd. Warfarin held held for three days 9/3-9/5 with supratherapeutic INR, then again 9/7 with reported active bleeding from head wound s/p fall. Per RN, no further bleeding or oozing from wound. Ok to continue warfarin 9/8 per discussion with MD.  INR down to 1.95 (likely due to held dose 9/7). HgB low stable, plt wnl. Noted on amiodarone. FOBT negative on 9/7.  Home Coumadin regimen: 3 mg TTSS, 1.5 mg MWF  Goal of Therapy:  INR 2-3 Monitor platelets by anticoagulation protocol: Yes   Plan:  Warfarin 3mg  PO x 1 Daily PT/INR Monitor CBC, for s/sx of bleeding  Elicia Lamp, PharmD, BCPS Clinical Pharmacist Rx Phone # for today: [phone] After 3:30PM, please call Main Rx: #[id number] 01/24/2017 12:30 PM

## 2017-01-24 NOTE — Progress Notes (Signed)
[name], female   DOB: [birth year], 83 y.o.   MRN: [id number]   PROGRESS NOTE   Valerie Terrell  QIH:[id number] DOB: 1932-10-08 DOA: 01/10/2017  PCP: Tammi Sou, MD  Brief Narrative:  Pt is 83 yo female with known CKD stage III, solitary functional right kidney, admitted after an episodes of fall and was found to have CHF and rising Cr.   Assessment & Plan:  Acute metabolic encephalopathy  - Likely due to acute infection, hypoxia - Drowsy on 9/5 - Better mental status this am  Acute on chronic hypoxic respiratory failure (home o2 3-4 liters at baseline), acute on chronic sCHF, LLL PNA - Chest x-ray on admission showed interstitial pulmonary edema, stable bilateral pleural effusion and possible left lower lobe atelectasis  - Completed 8 days of Levaquin   Acute on chronic systolic CHF - Nonischemic cardiomyopathy (no regional wall motion on echo, negative stress test),EF of 35-40% by echo in July. - Management per cardio and renal - Continue IV lasix Filed Weights   01/22/17 0408 01/23/17 0405 01/24/17 0359  Weight: 53.5 kg (118 lb) 52.8 kg (116 lb 4.8 oz) 52.8 kg (116 lb 6.4 oz)   Hyponatremia - Likely due to CHF etiology  - Sodium 133  Hypokalemia - Due to Lasix - Continue to supplement   Paroxysmal atrial fibrillation with h/o tachy-brady s/p pacemaker placement in 11/2016, CHADS2-Vasc score 5 - H/o subarachnoid hemorrhage after syncope while on xarelto in 8756, h/o embolic cva while on anticoagulation - Continue coreg and amiodarone - Continue Coumadin for [facility]  Acute kidney injury on Chronic kidney disease stage IV - solitary kidney (History of a nonfunctioning left kidney with mild right renal artery stenosis demonstrated by duplex ultrasound October 2017.) - Monitor daily BMP as pt on IV lasix - Nephrology following   Anemia, normocytic and suspect of chronic disease, malnutrition - FOBT negative - Hgb stable   Essential  hypertension - Continue current meds: Norvasc, Carvedilol, hydralazine, Imdur  Undisplaced right ninth rib fracture without complicating feature, multiple old right rib fractures with healing - Pain controlled   Forehead abrasion 9/7 - S/P sutures, no bleeding   Dyslipidemia - Continue Pravachol  Generalized anxiety/depression, FTT, moderate PCM - pt started on Lexapro 5 mg PO QD and Mirtazapine 7.5 mg PO QHS per psychiatrist  - this is pt's 5th hospitalization this year and > 40 lbs weight loss - So far tolerates meds well - Nutritionist consulted - Recommendation for SNF based on PT eval - SW assistance appreciated for bed placement   DVT prophylaxis: On Coumadin  Code Status: full code  Family Communication: no family at the bedside this am Disposition Plan: home once transitioned to PO lasix   Consultants:   Cardiology  Nephrology   Psychiatry   Procedures:  None   Antimicrobials:   Levaquin 8/26 --> 9/5  Subjective: No overnight events.  Objective: Vitals:   01/23/17 2018 01/24/17 0359 01/24/17 0925 01/24/17 1148  BP: 121/83 (!) 176/60 (!) 155/43 (!) 161/49  Pulse: 62 69 66 61  Resp: 18 20 (!) 22 20  Temp: 98.6 F (37 C) 98.4 F (36.9 C) 98.8 F (37.1 C) 98.5 F (36.9 C)  TempSrc: Oral Oral Oral Oral  SpO2: 97% 98% 98% 100%  Weight:  52.8 kg (116 lb 6.4 oz)    Height:        Intake/Output Summary (Last 24 hours) at 01/24/17 1201 Last data filed at 01/24/17 1149  Gross per 24  hour  Intake             1020 ml  Output              670 ml  Net              350 ml   Filed Weights   01/22/17 0408 01/23/17 0405 01/24/17 0359  Weight: 53.5 kg (118 lb) 52.8 kg (116 lb 4.8 oz) 52.8 kg (116 lb 6.4 oz)   Physical Exam  Constitutional: Appears well-developed and well-nourished. No distress.  CVS: RRR, S1/S2 + Pulmonary: Effort and breath sounds normal Abdominal: Soft. BS +,  no distension Musculoskeletal: Normal range of motion. No tenderness.    Lymphadenopathy: No lymphadenopathy noted, cervical, inguinal. Neuro: Alert. No focal deficits  Skin: Skin is warm and dry.  Psychiatric: Normal mood and affect.     Data Reviewed: I have personally reviewed following labs and imaging studies  CBC:  Recent Labs Lab 01/19/17 0640 01/21/17 0424 01/22/17 0441 01/23/17 0728 01/24/17 0523  WBC 10.0 8.7 10.0 9.6 8.9  HGB 8.0* 7.5* 7.6* 8.2* 8.5*  HCT 26.3* 23.7* 24.3* 26.4* 27.6*  MCV 94.6 92.9 93.5 94.0 95.5  PLT 269 244 266 258 681   Basic Metabolic Panel:  Recent Labs Lab 01/19/17 0640 01/20/17 0249 01/21/17 0424 01/22/17 0441 01/23/17 0245 01/24/17 0523  NA 131* 128* 127* 128* 131* 133*  K 3.9 4.0 3.9 3.3* 4.2 3.4*  CL 89* 89* 87* 89* 91* 93*  CO2 31 28 30 30 30 28   GLUCOSE 98 105* 101* 108* 112* 95  BUN 66* 66* 64* 67* 61* 58*  CREATININE 2.94* 2.98* 2.76* 2.64* 2.43* 2.44*  CALCIUM 8.7* 8.3* 8.0* 7.7* 8.0* 8.3*  MG 2.2  --   --   --   --   --    Liver Function Tests:  Recent Labs Lab 01/21/17 0424  AST 22  ALT 16  ALKPHOS 64  BILITOT 0.7  PROT 6.0*  ALBUMIN 2.9*    Recent Labs Lab 01/21/17 0424  AMMONIA 11   Coagulation Profile:  Recent Labs Lab 01/20/17 0249 01/21/17 0424 01/22/17 0441 01/23/17 0245 01/24/17 0523  INR 3.06 2.66 2.39 2.04 1.95   Urine analysis:    Component Value Date/Time   COLORURINE YELLOW 01/20/2017 [place] 01/20/2017 1737   LABSPEC 1.012 01/20/2017 1737   PHURINE 5.0 01/20/2017 1737   GLUCOSEU NEGATIVE 01/20/2017 1737   HGBUR NEGATIVE 01/20/2017 1737   BILIRUBINUR NEGATIVE 01/20/2017 1737   BILIRUBINUR negative 09/24/2016 1453   KETONESUR NEGATIVE 01/20/2017 1737   PROTEINUR 30 (A) 01/20/2017 1737   UROBILINOGEN 0.2 09/24/2016 1453   NITRITE NEGATIVE 01/20/2017 1737   LEUKOCYTESUR NEGATIVE 01/20/2017 1737   No results found for this or any previous visit (from the past 240 hour(s)).  Radiology Studies: Dg Chest Port 1 View Result  Date: 01/20/2017 CHF with mild interstitial edema. Left lower lobe atelectasis and probable left pleural effusion. The ICD is in stable position. Thoracic aortic atherosclerosis.   Scheduled Meds: . acetaminophen  1,000 mg Oral QHS  . amiodarone  200 mg Oral Daily  . amLODipine  5 mg Oral Daily  . calcium-vitamin D  1 tablet Oral BID  . carvedilol  25 mg Oral BID WC  . escitalopram  5 mg Oral QHS  . folic acid  1 mg Oral Daily  . furosemide  80 mg Intravenous TID  . hydrALAZINE  100 mg Oral TID  . isosorbide  mononitrate  60 mg Oral Daily  . magic mouthwash  5 mL Oral TID  . mirtazapine  7.5 mg Oral QHS  . multivitamin with minerals  1 tablet Oral Once per day on Mon Wed Fri  . polyethylene glycol  17 g Oral Daily  . potassium chloride  40 mEq Oral Once  . pravastatin  80 mg Oral q1800  . sodium chloride flush  3 mL Intravenous Q12H  . vitamin C  500 mg Oral Daily  . Warfarin - Pharmacist Dosing Inpatient   Does not apply q1800   Continuous Infusions: . sodium chloride      LOS: 13 days   Time spent: 25 minutes  Greater than 50% of the time spent on counseling and coordinating the care.  Leisa Lenz, MD [facility] Pager [phone]  If 7PM-7AM, please contact night-coverage [email or web address] Password [email or web address] 01/24/2017, 12:01 PM

## 2017-01-24 NOTE — Progress Notes (Signed)
Assessment:  1 CKD3 with acute exacerbation, hemodynamically mediated 2 Solitary functioningright kidney 3 CHF/ volume overload persistent---will increase diuretics. Had discussion about potential dialysis. 4 Afib/warfarin 5 Oral mucositis, feels better 6 Low K-replace  Subjective: Interval History: More SOB this AM, poor appetite, felt like she had transient afib today  Objective: Vital signs in last 24 hours: Temp:  [98.4 F (36.9 C)-98.8 F (37.1 C)] 98.5 F (36.9 C) (09/09 1148) Pulse Rate:  [61-69] 61 (09/09 1148) Resp:  [18-22] 20 (09/09 1148) BP: (121-176)/(43-83) 161/49 (09/09 1148) SpO2:  [97 %-100 %] 100 % (09/09 1148) Weight:  [52.8 kg (116 lb 6.4 oz)] 52.8 kg (116 lb 6.4 oz) (09/09 0359) Weight change: 0.045 kg (1.6 oz)  Intake/Output from previous day: 09/08 0701 - 09/09 0700 In: 540 [P.O.:540] Out: 900 [Urine:900] Intake/Output this shift: Total I/O In: 480 [P.O.:480] Out: 120 [Urine:120]  General appearance: alert and cooperative Resp: rales left side, decreased on right Chest wall: no tenderness Cardio: regular rate and rhythm, S1, S2 normal, no murmur, click, rub or gallop GI: soft, non-tender; bowel sounds normal; no masses,  no organomegaly Extremities: edema 1=  Myoclonus and mild asterixis present  Lab Results:  Recent Labs  01/23/17 0728 01/24/17 0523  WBC 9.6 8.9  HGB 8.2* 8.5*  HCT 26.4* 27.6*  PLT 258 236   BMET:  Recent Labs  01/23/17 0245 01/24/17 0523  NA 131* 133*  K 4.2 3.4*  CL 91* 93*  CO2 30 28  GLUCOSE 112* 95  BUN 61* 58*  CREATININE 2.43* 2.44*  CALCIUM 8.0* 8.3*   No results for input(s): PTH in the last 72 hours. Iron Studies: No results for input(s): IRON, TIBC, TRANSFERRIN, FERRITIN in the last 72 hours. Studies/Results: No results found.  Scheduled: . acetaminophen  1,000 mg Oral QHS  . amiodarone  200 mg Oral Daily  . amLODipine  5 mg Oral Daily  . calcium-vitamin D  1 tablet Oral BID  .  carvedilol  25 mg Oral BID WC  . escitalopram  5 mg Oral QHS  . folic acid  1 mg Oral Daily  . furosemide  80 mg Intravenous TID  . hydrALAZINE  100 mg Oral TID  . isosorbide mononitrate  60 mg Oral Daily  . magic mouthwash  5 mL Oral TID  . mirtazapine  7.5 mg Oral QHS  . multivitamin with minerals  1 tablet Oral Once per day on Mon Wed Fri  . polyethylene glycol  17 g Oral Daily  . potassium chloride  40 mEq Oral Once  . pravastatin  80 mg Oral q1800  . sodium chloride flush  3 mL Intravenous Q12H  . vitamin C  500 mg Oral Daily  . warfarin  3 mg Oral ONCE-1800  . Warfarin - Pharmacist Dosing Inpatient   Does not apply q1800    LOS: 13 days   Shakeena Kafer C 01/24/2017,1:41 PM

## 2017-01-24 NOTE — Progress Notes (Signed)
Patient has decreased urinary output with lasix IV. Abnormal renal values. Dr. Charlies Silvers notified via text page.

## 2017-01-25 LAB — CBC
HCT: 26.9 % — ABNORMAL LOW (ref 36.0–46.0)
Hemoglobin: 8.3 g/dL — ABNORMAL LOW (ref 12.0–15.0)
MCH: 29.7 pg (ref 26.0–34.0)
MCHC: 30.9 g/dL (ref 30.0–36.0)
MCV: 96.4 fL (ref 78.0–100.0)
PLATELETS: 241 10*3/uL (ref 150–400)
RBC: 2.79 MIL/uL — AB (ref 3.87–5.11)
RDW: 17.4 % — ABNORMAL HIGH (ref 11.5–15.5)
WBC: 8.3 10*3/uL (ref 4.0–10.5)

## 2017-01-25 LAB — BASIC METABOLIC PANEL
Anion gap: 8 (ref 5–15)
BUN: 58 mg/dL — ABNORMAL HIGH (ref 6–20)
CO2: 32 mmol/L (ref 22–32)
Calcium: 8.7 mg/dL — ABNORMAL LOW (ref 8.9–10.3)
Chloride: 96 mmol/L — ABNORMAL LOW (ref 101–111)
Creatinine, Ser: 2.31 mg/dL — ABNORMAL HIGH (ref 0.44–1.00)
GFR calc Af Amer: 22 mL/min — ABNORMAL LOW (ref >60)
GFR calc non Af Amer: 19 mL/min — ABNORMAL LOW (ref >60)
Glucose, Bld: 106 mg/dL — ABNORMAL HIGH (ref 65–99)
POTASSIUM: 4.1 mmol/L (ref 3.5–5.1)
SODIUM: 136 mmol/L (ref 135–145)

## 2017-01-25 LAB — PROTIME-INR
INR: 2.01
PROTHROMBIN TIME: 22.6 s — AB (ref 11.4–15.2)

## 2017-01-25 MED ORDER — WARFARIN SODIUM 3 MG PO TABS
3.0000 mg | ORAL_TABLET | Freq: Once | ORAL | Status: AC
  Administered 2017-01-25: 3 mg via ORAL
  Filled 2017-01-25: qty 1

## 2017-01-25 MED ORDER — FUROSEMIDE 10 MG/ML IJ SOLN
120.0000 mg | Freq: Three times a day (TID) | INTRAVENOUS | Status: DC
  Administered 2017-01-25 – 2017-01-26 (×3): 120 mg via INTRAVENOUS
  Filled 2017-01-25 (×3): qty 10
  Filled 2017-01-25: qty 12

## 2017-01-25 NOTE — Progress Notes (Signed)
Assessment:  1 CKD3 with acute exacerbation, hemodynamically mediated 2 Solitary functioningright kidney 3 CHF/ volume overload persistent---she again expressed her willingness to do dialysis if needed; has been on the same dose of diuretics since Friday and her weights are stable but still on O2.  Will increase Lasix to 120 mg IV TID. 4 Afib/warfarin 5 Oral mucositis, feels better 6 Low K-replace  Subjective: Interval History: sleeping this AM, feels bad because she was woken up at 530 this AM.  Objective: Vital signs in last 24 hours: Temp:  [98.1 F (36.7 C)-98.5 F (36.9 C)] 98.1 F (36.7 C) (09/10 1139) Pulse Rate:  [59-64] 60 (09/10 1139) Resp:  [18-20] 18 (09/10 1139) BP: (132-154)/(40-55) 134/52 (09/10 1139) SpO2:  [98 %-100 %] 99 % (09/10 0445) Weight:  [52.6 kg (116 lb)] 52.6 kg (116 lb) (09/10 0445) Weight change: -0.181 kg (-6.4 oz)  Intake/Output from previous day: 09/09 0701 - 09/10 0700 In: 840 [P.O.:840] Out: 621 [Urine:620; Stool:1] Intake/Output this shift: Total I/O In: 240 [P.O.:240] Out: 200 [Urine:200]  General appearance: alert and cooperative Resp: rales left side, decreased on right Chest wall: no tenderness Cardio: regular rate and rhythm, S1, S2 normal, no murmur, click, rub or gallop GI: soft, non-tender; bowel sounds normal; no masses,  no organomegaly Extremities: edema 1=  Myoclonus and mild asterixis present + JVD  Lab Results:  Recent Labs  01/24/17 0523 01/25/17 0339  WBC 8.9 8.3  HGB 8.5* 8.3*  HCT 27.6* 26.9*  PLT 236 241   BMET:   Recent Labs  01/24/17 0523 01/25/17 0339  NA 133* 136  K 3.4* 4.1  CL 93* 96*  CO2 28 32  GLUCOSE 95 106*  BUN 58* 58*  CREATININE 2.44* 2.31*  CALCIUM 8.3* 8.7*   No results for input(s): PTH in the last 72 hours. Iron Studies: No results for input(s): IRON, TIBC, TRANSFERRIN, FERRITIN in the last 72 hours. Studies/Results: No results found.  Scheduled: . acetaminophen  1,000  mg Oral QHS  . amiodarone  200 mg Oral Daily  . amLODipine  5 mg Oral Daily  . calcium-vitamin D  1 tablet Oral BID  . carvedilol  25 mg Oral BID WC  . escitalopram  5 mg Oral QHS  . folic acid  1 mg Oral Daily  . furosemide  80 mg Intravenous TID  . hydrALAZINE  100 mg Oral TID  . isosorbide mononitrate  60 mg Oral Daily  . magic mouthwash  5 mL Oral TID  . mirtazapine  7.5 mg Oral QHS  . multivitamin with minerals  1 tablet Oral Once per day on Mon Wed Fri  . polyethylene glycol  17 g Oral Daily  . pravastatin  80 mg Oral q1800  . sodium chloride flush  3 mL Intravenous Q12H  . vitamin C  500 mg Oral Daily  . warfarin  3 mg Oral ONCE-1800  . Warfarin - Pharmacist Dosing Inpatient   Does not apply q1800    LOS: 14 days   Valerie Terrell 01/25/2017,1:01 PM

## 2017-01-25 NOTE — Progress Notes (Signed)
Occupational Therapy Treatment Patient Details Name: Alenah Sarria MRN: [id number] DOB: 03-17-1933 Today's Date: 01/25/2017    History of present illness Pt is an 83 y.o. female who presented to the ED for evaluation of bleeding head wound and R back pain after a fall. In the ED, hypoxic at 88% on her supplemental oxygen, tachypneic, mildly hypertensive, chest x-ray showed nondisplaced right ninth rib fracture without complicating features as well as multiple old right rib fractures with healing. She was briefly placed on nonrebreather mask. Admitted for acute on chronic hypoxic respiratory failure secondary to acute on chronic systolic and diastolic CHF and chest wall splinting related to pain from rib fracture. PMH significant for: chronic combined diastolic and systolic CHF, NICM, stage IV chronic kidney disease, CAD, PAF on warfarin, home oxygen 3-4 L/m, HLD, HTN, and CVA.   OT comments  Pt is progressing slowly toward plan of care related to ADL and self care tasks. Overall decreased activity tolerance and c/o back/rib pain. RN was made aware that pt was requesting tylenol prior to start of therapy. Focus on ADL participation/retraining next visit and attempt standing at sink if pt able/agreeable.   Follow Up Recommendations  SNF;Supervision/Assistance - 24 hour    Equipment Recommendations  Other (comment) (Defer to next venue)    Recommendations for Other Services      Precautions / Restrictions Precautions Precautions: Fall Restrictions Weight Bearing Restrictions: No       Mobility Bed Mobility Overal bed mobility:  (Pt up in chair upon OT arrival)                Transfers                      Balance Overall balance assessment: Needs assistance   Sitting balance-Leahy Scale: Fair                                     ADL either performed or assessed with clinical judgement   ADL Overall ADL's : Needs  assistance/impaired Eating/Feeding: Set up;Sitting   Grooming: Wash/dry hands;Wash/dry face;Oral care;Sitting;Supervision/safety   Upper Body Bathing: Minimal assistance;Sitting                             General ADL Comments: Pt was agreeable to ADL retraining session for grooming and limited transfers secondary to low back pain. RN was called and pt requested tylenol prior to start of treatment. Pt declined full ADL session as she stated that nursing student was going to come back later to assist her with this and she preferred not to do it at this time.  Pt with decreased activitiy tolerance overall and should benefit from continued OT to assist in maximizing independence with self care tasks and functional mobility/transfers.      Vision Baseline Vision/History: Wears glasses Patient Visual Report: No change from baseline     Perception     Praxis      Cognition Arousal/Alertness: Awake/alert Behavior During Therapy: WFL for tasks assessed/performed Overall Cognitive Status: History of cognitive impairments - at baseline                         Following Commands: Follows one step commands consistently     Problem Solving: Slow processing General Comments: pt much more alert and less anxious  Exercises     Shoulder Instructions       General Comments      Pertinent Vitals/ Pain       Pain Assessment: Faces Pain Score: 4  Faces Pain Scale: Hurts little more Pain Location: Back Pain Descriptors / Indicators: Grimacing;Guarding Pain Intervention(s): Monitored during session;Limited activity within patient's tolerance;Patient requesting pain meds-RN notified  Home Living                                          Prior Functioning/Environment              Frequency  Min 2X/week        Progress Toward Goals  OT Goals(current goals can now be found in the care plan section)  Progress towards OT goals:  Progressing toward goals     Plan Discharge plan remains appropriate    Co-evaluation                 AM-PAC PT "6 Clicks" Daily Activity     Outcome Measure   Help from another person eating meals?: None Help from another person taking care of personal grooming?: A Little Help from another person toileting, which includes using toliet, bedpan, or urinal?: A Lot Help from another person bathing (including washing, rinsing, drying)?: A Lot Help from another person to put on and taking off regular upper body clothing?: A Little Help from another person to put on and taking off regular lower body clothing?: A Lot 6 Click Score: 16    End of Session Equipment Utilized During Treatment: Oxygen;Gait belt (PT came into room prior to pt ambulation)  OT Visit Diagnosis: Unsteadiness on feet (R26.81);Muscle weakness (generalized) (M62.81);Pain Pain - Right/Left: Right Pain - part of body:  (Back/ribs)   Activity Tolerance Patient tolerated treatment well   Patient Left in chair;with call bell/phone within reach;with family/visitor present   Nurse Communication Patient requests pain meds        Time: 0842-0900 OT Time Calculation (min): 18 min  Charges: OT General Charges $OT Visit: 1 Visit OT Treatments $Self Care/Home Management : 8-22 mins  Destiny Hagin, OTR/L 01/25/17 9:15 AM    Mekisha Bittel Beth Dixon 01/25/2017

## 2017-01-25 NOTE — Clinical Social Work Note (Signed)
CSW continues to follow for discharge needs.  Hartley Wyke, CSW [phone]  

## 2017-01-25 NOTE — Progress Notes (Signed)
[name], female   DOB: 1932/12/19, 83 y.o.   MRN: [id number]   PROGRESS NOTE   Valerie Terrell  IEP:[id number] DOB: 22-May-1932 DOA: 01/10/2017  PCP: Tammi Sou, MD  Brief Narrative:  Pt is 83 yo female with known CKD stage III, solitary functional right kidney, admitted after an episodes of fall and was found to have CHF and rising Cr.   Assessment & Plan:   Drowsiness/encephalopathy? -Patient was found to be very drowsy on 9/5 am, she falls back to sleep during conversation on 9/5 - No narcotic or sedative the night before or 9/5 am -Drowsiness likely multifactorial including worsening chf with poor brain perfusion, hypoxia, hyponatremia, worsening of bun/cr, delirium from FTT, prolonged hospitalization could also contribute -cxr with worsening of chf, stat ABG co2 upper normal limit with normal PH. She is started on higher dose of lasix  - improving    Acute on chronic hypoxic respiratory failure (home o2 3-4liters at baseline): - Chest x-ray on admission showed interstitial pulmonary edema, stable bilateral pleural effusion and possible left lower lobe atelectasis  -treating chf (cxr with Atelectasis versus left lower lobe pneumonia on admission: she received oral Levaquin x 8days, last dose on 9/5. No fever. Continue to monitor off abx)  Acute on chronic systolic chf: -Nonischemic cardiomyopathy (no regional wall motion on echo, negative stress test),EF of 35-40% by echo in July. -chf Presumed from afib -she appear to have increased oxygen requirement and become more drowsy this am on 9/5, suspect worsening of chf, repeat  cxr on 9/5 confirmed chf, -patient is started back on iv lasix, cardiology and nephrology consulted.   Hyponatremia: from fluids overload?  Sodium nadir to 127 on 9/6 Restarted on iv lasix, sodium improved and normalized on 9/10  Hypokalemia; replace prn, keep k>4.  Paroxysmal atrial fibrillation with h/o tachy-brady s/p  pacemaker placement in 11/2016:  H/o subarachnoid hemorrhage after syncope while on xarelto in 6606, h/o embolic cva while on anticoagulation Currently paced rhythm, Continue coreg/amiodarone and Coumadin.  Coumadin per pharmacy  Cardiology following  Acute kidney injury on Chronic kidney disease stage IV:  -solitary kidney (History of a nonfunctioning left kidney with mild right renal artery stenosis demonstrated by duplex ultrasound October 2017.) - bun/cr peaked on 9.5, start to improve on higher dose of lasix - nephrology input appreciated - Avoid nephrotoxins. Monitor BMP.   Anemia , normocytic, no sign of bleed, last fobt in 11/2016 was negative, FOBT negative.  Low folate, start folate supplement,  iron/b12 adequate. tibili wnl. Calcium low Likely component of anemia of chronic disease Consider transfusion if hgb less than 7. Consider EPO , nephrology to decide.   Essential hypertension: currently on  amlodipine, Lasix, Coreg, hydralazine, Imdur  Undisplaced right ninth rib fracture without complicating feature, multiple old right rib fractures with healing: Pain management, incentive spirometer and supportive care. PT OT evaluation.   Forehead abrasion: s/p suture on admission.  RN report started to bleed at around 7pm, advise manual pressure for a longer duration due to patient on coumadin, then pressure dressing. No active bleed today  Generalized anxiety/depression:  Started Lexapro 5 mg daily at bedtime and mirtazapine 7.5 mg at bedtimeby psychiatrist. Encourage oral intake.  Non-severe (moderate) malnutrition in context of chronic illness Body mass index is 23.83 kg/m. nutriton consulted, on nutrition supplement  FTT: this is her 5 hospitalizations this year, daughter report 40pounds of weight loss this year. PT/OT,  SNF placement ( significant care giver exhaustion as well)  DVT prophylaxis: Coumadin Code Status: Full code Family Communication:  Patient's daughter at bedside Disposition Plan: not ready for discharge, need cardiology and nephrology clearance,   SNF placement at discharge .    Consultants:  Cardiology   Nephrology  psychiatry  Procedures: None Antimicrobials: Levaquin from admission to 9/5.   Subjective: Sitting up in chair, report feeling better, report chronic back pain , she think it related to the rib fracture Cr improving, urine output not accurately documented  Objective: Vitals:   01/25/17 0140 01/25/17 0445 01/25/17 0833 01/25/17 1139  BP: (!) 136/46 (!) 143/55 (!) 154/48 (!) 134/52  Pulse: (!) 59 61 64 60  Resp:  20 18 18   Temp:  98.4 F (36.9 C) 98.3 F (36.8 C) 98.1 F (36.7 C)  TempSrc:  Oral Oral Oral  SpO2:  99%    Weight:  52.6 kg (116 lb)    Height:        Intake/Output Summary (Last 24 hours) at 01/25/17 1331 Last data filed at 01/25/17 1103  Gross per 24 hour  Intake              600 ml  Output              701 ml  Net             -101 ml   Filed Weights   01/23/17 0405 01/24/17 0359 01/25/17 0445  Weight: 52.8 kg (116 lb 4.8 oz) 52.8 kg (116 lb 6.4 oz) 52.6 kg (116 lb)   Physical Exam  Constitutional: . Frail, chronically ill appearing, fully alert, oriented x3, seems stronger CVS: paced rhythm Pulmonary: poor respiratory effort, less bibasilar crackles , over all very diminished, no wheezing, no rhonchi Abdominal: Soft. BS +,  no distension Musculoskeletal: Normal range of motion. No tenderness.  Lymphadenopathy: No lymphadenopathy noted, cervical, inguinal. Neuro: Alert. No focal deficits  Skin: Skin is warm and dry.  Psychiatric: Normal mood and affect.     Data Reviewed: I have personally reviewed following labs and imaging studies  CBC:  Recent Labs Lab 01/21/17 0424 01/22/17 0441 01/23/17 0728 01/24/17 0523 01/25/17 0339  WBC 8.7 10.0 9.6 8.9 8.3  HGB 7.5* 7.6* 8.2* 8.5* 8.3*  HCT 23.7* 24.3* 26.4* 27.6* 26.9*  MCV 92.9 93.5 94.0 95.5  96.4  PLT 244 266 258 236 387   Basic Metabolic Panel:  Recent Labs Lab 01/19/17 0640  01/21/17 0424 01/22/17 0441 01/23/17 0245 01/24/17 0523 01/25/17 0339  NA 131*  < > 127* 128* 131* 133* 136  K 3.9  < > 3.9 3.3* 4.2 3.4* 4.1  CL 89*  < > 87* 89* 91* 93* 96*  CO2 31  < > 30 30 30 28  32  GLUCOSE 98  < > 101* 108* 112* 95 106*  BUN 66*  < > 64* 67* 61* 58* 58*  CREATININE 2.94*  < > 2.76* 2.64* 2.43* 2.44* 2.31*  CALCIUM 8.7*  < > 8.0* 7.7* 8.0* 8.3* 8.7*  MG 2.2  --   --   --   --   --   --   < > = values in this interval not displayed. Liver Function Tests:  Recent Labs Lab 01/21/17 0424  AST 22  ALT 16  ALKPHOS 64  BILITOT 0.7  PROT 6.0*  ALBUMIN 2.9*    Recent Labs Lab 01/21/17 0424  AMMONIA 11   Coagulation Profile:  Recent Labs Lab 01/21/17 0424 01/22/17 0441 01/23/17 0245 01/24/17 0523 01/25/17  1962  INR 2.66 2.39 2.04 1.95 2.01   Urine analysis:    Component Value Date/Time   COLORURINE YELLOW 01/20/2017 [place] 01/20/2017 1737   LABSPEC 1.012 01/20/2017 1737   PHURINE 5.0 01/20/2017 1737   GLUCOSEU NEGATIVE 01/20/2017 1737   HGBUR NEGATIVE 01/20/2017 1737   BILIRUBINUR NEGATIVE 01/20/2017 1737   BILIRUBINUR negative 09/24/2016 [place] 01/20/2017 1737   PROTEINUR 30 (A) 01/20/2017 1737   UROBILINOGEN 0.2 09/24/2016 1453   NITRITE NEGATIVE 01/20/2017 1737   LEUKOCYTESUR NEGATIVE 01/20/2017 1737   No results found for this or any previous visit (from the past 240 hour(s)).  Radiology Studies: Dg Chest Port 1 View Result Date: 01/20/2017 CHF with mild interstitial edema. Left lower lobe atelectasis and probable left pleural effusion. The ICD is in stable position. Thoracic aortic atherosclerosis.   Scheduled Meds: . acetaminophen  1,000 mg Oral QHS  . amiodarone  200 mg Oral Daily  . amLODipine  5 mg Oral Daily  . calcium-vitamin D  1 tablet Oral BID  . carvedilol  25 mg Oral BID WC  .  escitalopram  5 mg Oral QHS  . folic acid  1 mg Oral Daily  . hydrALAZINE  100 mg Oral TID  . isosorbide mononitrate  60 mg Oral Daily  . magic mouthwash  5 mL Oral TID  . mirtazapine  7.5 mg Oral QHS  . multivitamin with minerals  1 tablet Oral Once per day on Mon Wed Fri  . polyethylene glycol  17 g Oral Daily  . pravastatin  80 mg Oral q1800  . sodium chloride flush  3 mL Intravenous Q12H  . vitamin C  500 mg Oral Daily  . warfarin  3 mg Oral ONCE-1800  . Warfarin - Pharmacist Dosing Inpatient   Does not apply q1800   Continuous Infusions: . sodium chloride    . furosemide      LOS: 14 days   Time spent: 25 minutes   I have personally reviewed and interpreted on  01/25/2017  daily labs, tele strips, imagings as discussed above under date review session and assessment and plans.  I reviewed all nursing notes, pharmacy notes, consultant notes,  vitals, pertinent old records  I have discussed plan of care as described above with RN , patient 01/25/2017    Florencia Reasons, MD PhD [facility] Pager [phone]  If 7PM-7AM, please contact night-coverage [email or web address] Password [email or web address] 01/25/2017, 1:31 PM

## 2017-01-25 NOTE — Progress Notes (Signed)
ANTICOAGULATION CONSULT NOTE - Follow Up Consult  Pharmacy Consult for Coumadin Indication: atrial fibrillation  Allergies  Allergen Reactions  . Clonidine Derivatives Palpitations and Other (See Comments)    Very sedated  . Codeine Nausea Only  . Nickel Rash  . Sulfa Antibiotics Other (See Comments)    Reaction unknown   Patient Measurements: Height: 4\' 11"  (149.9 cm) Weight: 116 lb (52.6 kg) (scaleb\ b) IBW/kg (Calculated) : 43.2  Assessment:  83 yr old female on Coumadin 3mg  daily exc for 1.5mg  on MWF PTA for Afib/CVA. On Amio 200 BID as pta. Labile INR this admit, held several days, then again 9/7 s/p fall with reported active bleeding from head. Per RN, resolved 9/8 AM. INR now mostly stable at 2.01 today. Hgb low but stable at 8.3, plts wnl.   Goal of Therapy:  INR 2-3 Monitor platelets by anticoagulation protocol: Yes   Plan:  Give Coumadin 3mg  PO daily x 1 Monitor daily INR, CBC, s/s of bleed  Elenor Quinones, PharmD, BCPS Clinical Pharmacist Pager [phone] 01/25/2017 8:08 AM

## 2017-01-25 NOTE — Progress Notes (Signed)
Physical Therapy Treatment Patient Details Name: Valerie Terrell MRN: [id number] DOB: 1932/09/11 Today's Date: 01/25/2017    History of Present Illness Pt is an 83 y.o. female who presented to the ED for evaluation of bleeding head wound and R back pain after a fall. In the ED, hypoxic at 88% on her supplemental oxygen, tachypneic, mildly hypertensive, chest x-ray showed nondisplaced right ninth rib fracture without complicating features as well as multiple old right rib fractures with healing. She was briefly placed on nonrebreather mask. Admitted for acute on chronic hypoxic respiratory failure secondary to acute on chronic systolic and diastolic CHF and chest wall splinting related to pain from rib fracture. PMH significant for: chronic combined diastolic and systolic CHF, NICM, stage IV chronic kidney disease, CAD, PAF on warfarin, home oxygen 3-4 L/m, HLD, HTN, and CVA.    PT Comments    Patient tolerated gait distance of 80ft and declined to go farther. Pt agreeable to bilat LE therex and OOB in chair end of session.  Pt reported feeling better today and did not demonstrate SOB or anxiety when mobilizing this am. Continue to progress as tolerated with anticipated d/c to SNF for further skilled PT services.    Follow Up Recommendations  Supervision/Assistance - 24 hour;SNF     Equipment Recommendations  Other (comment) (TBD next venue)    Recommendations for Other Services       Precautions / Restrictions Precautions Precautions: Fall Restrictions Weight Bearing Restrictions: No    Mobility  Bed Mobility Overal bed mobility:  (Pt up in chair upon OT arrival)             General bed mobility comments: pt OOB in chair upon arrival  Transfers Overall transfer level: Needs assistance Equipment used: Rolling walker (2 wheeled) Transfers: Sit to/from Stand Sit to Stand: Min guard;Min assist         General transfer comment: min guard for safety to stand and min A upon  standing for balance; posterior bias; cues for safe hand placement  Ambulation/Gait Ambulation/Gait assistance: Min assist Ambulation Distance (Feet): 20 Feet Assistive device: Rolling walker (2 wheeled) Gait Pattern/deviations: Trunk flexed;Narrow base of support;Step-through pattern;Decreased stride length Gait velocity: slow   General Gait Details: assist for balance as pt has posterior bias that increases with attempts extend trunk; cues for bilat step length and proximity of RW   Stairs            Wheelchair Mobility    Modified Rankin (Stroke Patients Only)       Balance Overall balance assessment: Needs assistance Sitting-balance support: Bilateral upper extremity supported Sitting balance-Leahy Scale: Fair     Standing balance support: Single extremity supported Standing balance-Leahy Scale: Poor                              Cognition Arousal/Alertness: Awake/alert Behavior During Therapy: WFL for tasks assessed/performed Overall Cognitive Status: History of cognitive impairments - at baseline                         Following Commands: Follows one step commands consistently     Problem Solving: Slow processing General Comments: pt much more alert and less anxious      Exercises General Exercises - Lower Extremity Ankle Circles/Pumps: AROM;Both;20 reps;Seated Long Arc Quad: AROM;Both;15 reps;Seated Hip ABduction/ADduction: AROM;Both;15 reps Hip Flexion/Marching: AROM;Both;15 reps;Seated    General Comments  Pertinent Vitals/Pain Pain Assessment: Faces Pain Score: 4  Faces Pain Scale: Hurts a little bit Pain Location: Back Pain Descriptors / Indicators: Guarding Pain Intervention(s): Limited activity within patient's tolerance;Monitored during session;Repositioned;Patient requesting pain meds-RN notified    Home Living                      Prior Function            PT Goals (current goals can now be  found in the care plan section) Acute Rehab PT Goals PT Goal Formulation: With patient Time For Goal Achievement: 01/20/17 Potential to Achieve Goals: Fair Progress towards PT goals: Progressing toward goals    Frequency    Min 3X/week      PT Plan Current plan remains appropriate    Co-evaluation              AM-PAC PT "6 Clicks" Daily Activity  Outcome Measure  Difficulty turning over in bed (including adjusting bedclothes, sheets and blankets)?: A Lot Difficulty moving from lying on back to sitting on the side of the bed? : A Lot Difficulty sitting down on and standing up from a chair with arms (e.g., wheelchair, bedside commode, etc,.)?: Unable Help needed moving to and from a bed to chair (including a wheelchair)?: A Little Help needed walking in hospital room?: A Little Help needed climbing 3-5 steps with a railing? : A Lot 6 Click Score: 13    End of Session Equipment Utilized During Treatment: Oxygen;Gait belt Activity Tolerance: Patient tolerated treatment well Patient left: with call bell/phone within reach;in chair;with family/visitor present Nurse Communication: Mobility status PT Visit Diagnosis: Unsteadiness on feet (R26.81);Other abnormalities of gait and mobility (R26.89);Repeated falls (R29.6);Muscle weakness (generalized) (M62.81);History of falling (Z91.81);Difficulty in walking, not elsewhere classified (R26.2)     Time: 7209-4709 PT Time Calculation (min) (ACUTE ONLY): 13 min  Charges:  $Gait Training: 8-22 mins                    G Codes:       Earney Navy, PTA Pager: [phone]     Darliss Cheney 01/25/2017, 11:27 AM

## 2017-01-26 LAB — PROTIME-INR
INR: 1.94
Prothrombin Time: 22 seconds — ABNORMAL HIGH (ref 11.4–15.2)

## 2017-01-26 LAB — BASIC METABOLIC PANEL
ANION GAP: 8 (ref 5–15)
BUN: 58 mg/dL — AB (ref 6–20)
CO2: 32 mmol/L (ref 22–32)
Calcium: 9.1 mg/dL (ref 8.9–10.3)
Chloride: 95 mmol/L — ABNORMAL LOW (ref 101–111)
Creatinine, Ser: 2.28 mg/dL — ABNORMAL HIGH (ref 0.44–1.00)
GFR, EST AFRICAN AMERICAN: 22 mL/min — AB (ref >60)
GFR, EST NON AFRICAN AMERICAN: 19 mL/min — AB (ref >60)
Glucose, Bld: 92 mg/dL (ref 65–99)
POTASSIUM: 3.6 mmol/L (ref 3.5–5.1)
SODIUM: 135 mmol/L (ref 135–145)

## 2017-01-26 MED ORDER — POLYETHYLENE GLYCOL 3350 17 G PO PACK: 17.0000 g | PACK | Freq: Every day | ORAL | Status: DC | PRN

## 2017-01-26 MED ORDER — FUROSEMIDE 10 MG/ML IJ SOLN
160.0000 mg | Freq: Three times a day (TID) | INTRAVENOUS | Status: DC
  Administered 2017-01-26 – 2017-01-28 (×8): 160 mg via INTRAVENOUS
  Filled 2017-01-26: qty 16
  Filled 2017-01-26 (×2): qty 10
  Filled 2017-01-26: qty 16
  Filled 2017-01-26: qty 2
  Filled 2017-01-26 (×3): qty 16
  Filled 2017-01-26: qty 2
  Filled 2017-01-26: qty 16
  Filled 2017-01-26: qty 10

## 2017-01-26 MED ORDER — WARFARIN SODIUM 3 MG PO TABS
3.0000 mg | ORAL_TABLET | Freq: Once | ORAL | Status: AC
  Administered 2017-01-26: 3 mg via ORAL
  Filled 2017-01-26: qty 1

## 2017-01-26 NOTE — Progress Notes (Signed)
Patient slept during the night.  No complaints or concerns from 7pm-7am.  Patient remains alert and oriented x 4. Denies pain.  Will continue to monitor.  Valerie Terrell

## 2017-01-26 NOTE — Progress Notes (Signed)
[name], female   DOB: 1932-11-23, 83 y.o.   MRN: [id number]   PROGRESS NOTE   Valerie Terrell  PNT:[id number] DOB: Oct 02, 1932 DOA: 01/10/2017  PCP: Tammi Sou, MD  Brief Narrative:  Pt is 83 yo female with known CKD stage III, solitary functional right kidney, admitted after an episodes of fall and was found to have CHF and rising Cr.   Assessment & Plan:   Drowsiness/encephalopathy? -Patient was found to be very drowsy on 9/5 am, she falls back to sleep during conversation on 9/5 - No narcotic or sedative the night before or 9/5 am -Drowsiness likely multifactorial including worsening chf with poor brain perfusion, hypoxia, hyponatremia, worsening of bun/cr, delirium from FTT, prolonged hospitalization could also contribute -cxr with worsening of chf, stat ABG co2 upper normal limit with normal PH. She is started on higher dose of lasix - encephalopathy seems has resolved.   Acute on chronic systolic chf/ Acute on chronic hypoxic respiratory failure (home o2 3-4liters at baseline): - Chest x-ray on admission showed interstitial pulmonary edema, stable bilateral pleural effusion and possible left lower lobe atelectasis  -treating chf (cxr with Atelectasis versus left lower lobe pneumonia on admission: she received oral Levaquin x 8days, last dose on 9/5. No fever. Continue to monitor off abx) -Nonischemic cardiomyopathy (no regional wall motion on echo, negative stress test),EF of 35-40% by echo in July. -chf Presumed from afib -she appear to have increased oxygen requirement and become more drowsy this am on 9/5, suspect worsening of chf, repeat  cxr on 9/5 confirmed chf, -patient is started back on iv lasix, cardiology and nephrology consulted.  -nephrology plan to start HD, as patient does not seem to improve on high dose of lasix  Hyponatremia: from fluids overload?  Sodium nadir to 127 on 9/6 sodium improved and normalized on 9/10  Hypokalemia;  replace prn, keep k>4.  Paroxysmal atrial fibrillation with h/o tachy-brady s/p pacemaker placement in 11/2016:  H/o subarachnoid hemorrhage after syncope while on xarelto in 0867, h/o embolic cva while on anticoagulation Currently paced rhythm, Continue coreg/amiodarone and Coumadin.  Coumadin per pharmacy , inr 1.94 today Cardiology following  Acute kidney injury on Chronic kidney disease stage IV:  -solitary kidney (History of a nonfunctioning left kidney with mild right renal artery stenosis demonstrated by duplex ultrasound October 2017.) - bun/cr peaked on 9.5, start to improve on higher dose of lasix - nephrology input appreciated, per nephrology patient may need dialysis - Avoid nephrotoxins. Monitor BMP.   Anemia , normocytic, no sign of bleed, last fobt in 11/2016 was negative, FOBT negative.  Low folate, start folate supplement,  iron/b12 adequate. tibili wnl. Calcium low Likely component of anemia of chronic disease Consider transfusion if hgb less than 7. Consider EPO , nephrology to decide.   Essential hypertension: currently on  amlodipine, Lasix, Coreg, hydralazine, Imdur  Undisplaced right ninth rib fracture without complicating feature, multiple old right rib fractures with healing: Pain management, incentive spirometer and supportive care. PT OT evaluation.   Forehead abrasion: s/p suture on admission.  RN report started to bleed at around 7pm, advise manual pressure for a longer duration due to patient on coumadin, then pressure dressing. No active bleed today  Generalized anxiety/depression:  Started Lexapro 5 mg daily at bedtime and mirtazapine 7.5 mg at bedtimeby psychiatrist. Encourage oral intake.  Non-severe (moderate) malnutrition in context of chronic illness Body mass index is 23.83 kg/m. nutriton consulted, on nutrition supplement  FTT: this is her 5  hospitalizations this year, daughter report 40pounds of weight loss this year. PT/OT,  SNF  placement ( significant care giver exhaustion as well)     DVT prophylaxis: Coumadin Code Status: Full code Family Communication: Patient Disposition Plan: not ready for discharge, need cardiology and nephrology clearance,   SNF placement at discharge .    Consultants:  Cardiology, signed off on 9/7   Nephrology  Psychiatry, signed off on 9/2  Procedures: None Antimicrobials: Levaquin from admission to 9/5.   Subjective: Frail elderly female, Sitting up in chair, report chronic back pain , she think it related to the rib fracture She think her legs are more swollen today Cr improving, urine output not accurately documented  Objective: Vitals:   01/26/17 0503 01/26/17 0607 01/26/17 1028 01/26/17 1152  BP: (!) 172/51 (!) 155/44 (!) 174/45 (!) 156/47  Pulse: 62 60  (!) 59  Resp: 18   20  Temp: 97.7 F (36.5 C)     TempSrc: Oral     SpO2: 99% 99% 100% 100%  Weight: 53.1 kg (117 lb)     Height:        Intake/Output Summary (Last 24 hours) at 01/26/17 1311 Last data filed at 01/26/17 1116  Gross per 24 hour  Intake              700 ml  Output             1140 ml  Net             -440 ml   Filed Weights   01/24/17 0359 01/25/17 0445 01/26/17 0503  Weight: 52.8 kg (116 lb 6.4 oz) 52.6 kg (116 lb) 53.1 kg (117 lb)   Physical Exam  Constitutional: . Frail, chronically ill appearing, fully alert, oriented x3,  CVS: paced rhythm Pulmonary: poor respiratory effort, less bibasilar crackles , over all very diminished, no wheezing, no rhonchi Abdominal: Soft. BS +,  no distension Musculoskeletal: Normal range of motion. No tenderness. ? Pitting edema? Lymphadenopathy: No lymphadenopathy noted, cervical, inguinal. Neuro: Alert. No focal deficits  Skin: Skin is warm and dry.  Psychiatric: Normal mood and affect.     Data Reviewed: I have personally reviewed following labs and imaging studies  CBC:  Recent Labs Lab 01/21/17 0424 01/22/17 0441  01/23/17 0728 01/24/17 0523 01/25/17 0339  WBC 8.7 10.0 9.6 8.9 8.3  HGB 7.5* 7.6* 8.2* 8.5* 8.3*  HCT 23.7* 24.3* 26.4* 27.6* 26.9*  MCV 92.9 93.5 94.0 95.5 96.4  PLT 244 266 258 236 751   Basic Metabolic Panel:  Recent Labs Lab 01/22/17 0441 01/23/17 0245 01/24/17 0523 01/25/17 0339 01/26/17 0536  NA 128* 131* 133* 136 135  K 3.3* 4.2 3.4* 4.1 3.6  CL 89* 91* 93* 96* 95*  CO2 30 30 28  32 32  GLUCOSE 108* 112* 95 106* 92  BUN 67* 61* 58* 58* 58*  CREATININE 2.64* 2.43* 2.44* 2.31* 2.28*  CALCIUM 7.7* 8.0* 8.3* 8.7* 9.1   Liver Function Tests:  Recent Labs Lab 01/21/17 0424  AST 22  ALT 16  ALKPHOS 64  BILITOT 0.7  PROT 6.0*  ALBUMIN 2.9*    Recent Labs Lab 01/21/17 0424  AMMONIA 11   Coagulation Profile:  Recent Labs Lab 01/22/17 0441 01/23/17 0245 01/24/17 0523 01/25/17 0339 01/26/17 0536  INR 2.39 2.04 1.95 2.01 1.94   Urine analysis:    Component Value Date/Time   COLORURINE YELLOW 01/20/2017 1737   APPEARANCEUR CLEAR 01/20/2017 1737   LABSPEC  1.012 01/20/2017 1737   PHURINE 5.0 01/20/2017 1737   GLUCOSEU NEGATIVE 01/20/2017 1737   HGBUR NEGATIVE 01/20/2017 1737   BILIRUBINUR NEGATIVE 01/20/2017 1737   BILIRUBINUR negative 09/24/2016 [place] 01/20/2017 1737   PROTEINUR 30 (A) 01/20/2017 1737   UROBILINOGEN 0.2 09/24/2016 1453   NITRITE NEGATIVE 01/20/2017 1737   LEUKOCYTESUR NEGATIVE 01/20/2017 1737   No results found for this or any previous visit (from the past 240 hour(s)).  Radiology Studies: Dg Chest Port 1 View Result Date: 01/20/2017 CHF with mild interstitial edema. Left lower lobe atelectasis and probable left pleural effusion. The ICD is in stable position. Thoracic aortic atherosclerosis.   Scheduled Meds: . acetaminophen  1,000 mg Oral QHS  . amiodarone  200 mg Oral Daily  . amLODipine  5 mg Oral Daily  . calcium-vitamin D  1 tablet Oral BID  . carvedilol  25 mg Oral BID WC  . escitalopram  5 mg Oral  QHS  . folic acid  1 mg Oral Daily  . hydrALAZINE  100 mg Oral TID  . isosorbide mononitrate  60 mg Oral Daily  . magic mouthwash  5 mL Oral TID  . mirtazapine  7.5 mg Oral QHS  . multivitamin with minerals  1 tablet Oral Once per day on Mon Wed Fri  . polyethylene glycol  17 g Oral Daily  . pravastatin  80 mg Oral q1800  . sodium chloride flush  3 mL Intravenous Q12H  . vitamin C  500 mg Oral Daily  . warfarin  3 mg Oral ONCE-1800  . Warfarin - Pharmacist Dosing Inpatient   Does not apply q1800   Continuous Infusions: . sodium chloride    . furosemide      LOS: 15 days   Time spent: 25 minutes   I have personally reviewed and interpreted on  01/26/2017  daily labs, tele strips, imagings as discussed above under date review session and assessment and plans.  I reviewed all nursing notes, pharmacy notes, consultant notes,  vitals, pertinent old records  I have discussed plan of care as described above with RN , patient 01/26/2017    Florencia Reasons, MD PhD [facility] Pager [phone]  If 7PM-7AM, please contact night-coverage [email or web address] Password [email or web address] 01/26/2017, 1:11 PM

## 2017-01-26 NOTE — Progress Notes (Signed)
ANTICOAGULATION CONSULT NOTE - Follow Up Consult  Pharmacy Consult for Coumadin Indication: atrial fibrillation  Allergies  Allergen Reactions  . Clonidine Derivatives Palpitations and Other (See Comments)    Very sedated  . Codeine Nausea Only  . Nickel Rash  . Sulfa Antibiotics Other (See Comments)    Reaction unknown   Patient Measurements: Height: 4\' 11"  (149.9 cm) Weight: 117 lb (53.1 kg) (scale c) IBW/kg (Calculated) : 43.2  Assessment:  83 yr old female on Coumadin 3mg  daily exc for 1.5mg  on MWF PTA for Afib/CVA. On Amio 200 BID as pta. Labile INR this admit, held several days, then again 9/7 s/p fall with reported active bleeding from head. Per RN, resolved 9/8 AM. INR now mostly stable at 1.94 today. Hgb low but stable at 8.3, plts wnl.   Goal of Therapy:  INR 2-3 Monitor platelets by anticoagulation protocol: Yes   Plan:  Give Coumadin 3mg  PO daily x 1 Monitor daily INR, CBC, s/s of bleed  Elenor Quinones, PharmD, [facility] Clinical Pharmacist Pager [phone] 01/26/2017 7:54 AM

## 2017-01-26 NOTE — Progress Notes (Signed)
Nutrition Follow-up  DOCUMENTATION CODES:   Non-severe (moderate) malnutrition in context of chronic illness  INTERVENTION:   -Continue Carnation Instant Breakfast TID  -Encourage smaller, more frequent meals. Added snacks TID  -Pt may benefit from liberalizing diet    NUTRITION DIAGNOSIS:   Malnutrition (Moderate) related to chronic illness as evidenced by percent weight loss, mild depletion of body fat, moderate depletions of muscle mass, mild fluid accumulation.  Continues but being addressed via supplement, adding snacks  GOAL:   Patient will meet greater than or equal to 90% of their needs  Progressing  MONITOR:   PO intake, Supplement acceptance, Labs, Weight trends  REASON FOR ASSESSMENT:   Consult Assessment of nutrition requirement/status (wt loss)  ASSESSMENT:   83 yo female admitted with acute on chronic respiratory failure, fall with rib fracture, acute on chronic CHF. Pt with hx of CHF, CKD IV, CAD, afib   Noted plans to initiate dialysis for volume management Hyponatremia improved, UOP 1220 mL in previous 24 hours  Pt reports appetite remains poor. Ate good breakfast, PB and J sandwich, cereal and coffee. Reports in general she does not like the dinner meals. po intake 32% on average. Pt reports drinking some of Carnation Instant Breakfast drinks  Labs: Creatinine 2.28 (improving slowly), sodium and potassium wdl Meds: MVI, remeron  Diet Order:  Diet Heart Room service appropriate? Yes; Fluid consistency: Thin; Fluid restriction: 1200 mL Fluid  Skin:  Reviewed, no issues  Last BM:  9/5  Height:   Ht Readings from Last 1 Encounters:  01/11/17 4\' 11"  (1.499 m)    Weight:   Wt Readings from Last 1 Encounters:  01/26/17 117 lb (53.1 kg)    Ideal Body Weight:     BMI:  Body mass index is 23.63 kg/m.  Estimated Nutritional Needs:   Kcal:  1550-1750 kcals  Protein:  78-88 g  Fluid:  >/= 1.2 L  EDUCATION NEEDS:   No education  needs identified at this time  [place], [place], LDN [phone] Pager  [phone] Weekend/On-Call Pager

## 2017-01-26 NOTE — Progress Notes (Signed)
Assessment:  1 CKD3 with acute exacerbation, hemodynamically mediated 2 Solitary functioningright kidney 3 CHF/ volume overload persistent---Increasing doses of diuretics not helping.  Will increase again to Lasix 160 IV TID and vein map, c/s VVS.  Will initiate dialysis for refractory fluid overload. 4 Afib/warfarin 5 Oral mucositis, feels better 6 Low K-replace  Subjective:  Daughter at bedside.  Sitting in chair.  Had a long conversation with pt and daughter.  Lots of admissions in 2018 for fluid overload.  Increased dose of Lasix hasn't really helped.  Weight is up.    She reports that she "can't live like this" anymore but she is "not ready to diet yet."  We discussed dialysis in detail.  Will pursue.    Objective: Vital signs in last 24 hours: Temp:  [97.7 F (36.5 C)-98.8 F (37.1 C)] 97.7 F (36.5 C) (09/11 0503) Pulse Rate:  [59-62] 59 (09/11 1152) Resp:  [18-20] 20 (09/11 1152) BP: (155-174)/(41-53) 156/47 (09/11 1152) SpO2:  [98 %-100 %] 100 % (09/11 1152) Weight:  [53.1 kg (117 lb)] 53.1 kg (117 lb) (09/11 0503) Weight change: 0.454 kg (1 lb)  Intake/Output from previous day: 09/10 0701 - 09/11 0700 In: 530 [P.O.:480; IV Piggyback:50] Out: 1220 [Urine:1220] Intake/Output this shift: Total I/O In: 410 [P.O.:360; IV Piggyback:50] Out: 120 [Urine:120]  General appearance: alert and cooperative Resp: rales left side, decreased on right Chest wall: no tenderness Cardio: regular rate and rhythm, S1, S2 normal, no murmur, click, rub or gallop GI: soft, non-tender; bowel sounds normal; no masses,  no organomegaly Extremities: edema 1=  Myoclonus and mild asterixis present + JVD  Lab Results:  Recent Labs  01/24/17 0523 01/25/17 0339  WBC 8.9 8.3  HGB 8.5* 8.3*  HCT 27.6* 26.9*  PLT 236 241   BMET:   Recent Labs  01/25/17 0339 01/26/17 0536  NA 136 135  K 4.1 3.6  CL 96* 95*  CO2 32 32  GLUCOSE 106* 92  BUN 58* 58*  CREATININE 2.31* 2.28*   CALCIUM 8.7* 9.1   No results for input(s): PTH in the last 72 hours. Iron Studies: No results for input(s): IRON, TIBC, TRANSFERRIN, FERRITIN in the last 72 hours. Studies/Results: No results found.  Scheduled: . acetaminophen  1,000 mg Oral QHS  . amiodarone  200 mg Oral Daily  . amLODipine  5 mg Oral Daily  . calcium-vitamin D  1 tablet Oral BID  . carvedilol  25 mg Oral BID WC  . escitalopram  5 mg Oral QHS  . folic acid  1 mg Oral Daily  . hydrALAZINE  100 mg Oral TID  . isosorbide mononitrate  60 mg Oral Daily  . magic mouthwash  5 mL Oral TID  . mirtazapine  7.5 mg Oral QHS  . multivitamin with minerals  1 tablet Oral Once per day on Mon Wed Fri  . polyethylene glycol  17 g Oral Daily  . pravastatin  80 mg Oral q1800  . sodium chloride flush  3 mL Intravenous Q12H  . vitamin C  500 mg Oral Daily  . warfarin  3 mg Oral ONCE-1800  . Warfarin - Pharmacist Dosing Inpatient   Does not apply q1800    LOS: 15 days   Tj Kitchings 01/26/2017,12:07 PM

## 2017-01-27 ENCOUNTER — Inpatient Hospital Stay (HOSPITAL_COMMUNITY): Payer: Medicare Other

## 2017-01-27 DIAGNOSIS — N184 Chronic kidney disease, stage 4 (severe): Secondary | ICD-10-CM

## 2017-01-27 DIAGNOSIS — N186 End stage renal disease: Secondary | ICD-10-CM

## 2017-01-27 DIAGNOSIS — I5043 Acute on chronic combined systolic (congestive) and diastolic (congestive) heart failure: Secondary | ICD-10-CM

## 2017-01-27 LAB — BASIC METABOLIC PANEL
ANION GAP: 9 (ref 5–15)
BUN: 56 mg/dL — ABNORMAL HIGH (ref 6–20)
CO2: 32 mmol/L (ref 22–32)
Calcium: 8.9 mg/dL (ref 8.9–10.3)
Chloride: 95 mmol/L — ABNORMAL LOW (ref 101–111)
Creatinine, Ser: 2.21 mg/dL — ABNORMAL HIGH (ref 0.44–1.00)
GFR, EST AFRICAN AMERICAN: 23 mL/min — AB (ref >60)
GFR, EST NON AFRICAN AMERICAN: 20 mL/min — AB (ref >60)
GLUCOSE: 99 mg/dL (ref 65–99)
POTASSIUM: 3.1 mmol/L — AB (ref 3.5–5.1)
Sodium: 136 mmol/L (ref 135–145)

## 2017-01-27 LAB — ALBUMIN: ALBUMIN: 2.9 g/dL — AB (ref 3.5–5.0)

## 2017-01-27 LAB — PROTIME-INR
INR: 2.2
Prothrombin Time: 24.2 seconds — ABNORMAL HIGH (ref 11.4–15.2)

## 2017-01-27 MED ORDER — POTASSIUM CHLORIDE CRYS ER 20 MEQ PO TBCR
40.0000 meq | EXTENDED_RELEASE_TABLET | Freq: Two times a day (BID) | ORAL | Status: AC
  Administered 2017-01-27 (×2): 40 meq via ORAL
  Filled 2017-01-27 (×2): qty 2

## 2017-01-27 MED ORDER — WARFARIN SODIUM 3 MG PO TABS
1.5000 mg | ORAL_TABLET | Freq: Once | ORAL | Status: DC
  Filled 2017-01-27: qty 0.5

## 2017-01-27 NOTE — Progress Notes (Signed)
Left Upper Extremity Vein Map    Cephalic  Segment Diameter Depth Comment  1. Axilla 2.53mm 5.84mm   2. Mid upper arm 2.60mm 3.28mm   3. Above AC 1.82mm 1.47mm   4. In AC 2.29mm 3.78mm Branch  5. Below AC 3.22mm 2.69mm   6. Mid forearm 3.6mm 3.23mm   7. Wrist 5.6PV 9.4IA    Basilic  Segment Diameter Depth Comment  2. Mid upper arm 5.6mm 11.57mm   3. Above [facility] 3.19mm 5.86mm Branch  4. In AC 2.67mm 2.66mm   5. Below AC 1.80mm 4.10mm   6. Mid forearm 1.71mm 1.36mm   7. Wrist 1.33mm 1.96mm     [facility], RVS 01/27/2017, 6:37 PM

## 2017-01-27 NOTE — Progress Notes (Signed)
Patient complains of shortness of breath. PRN breathing treatment in progress. SPO2 98% on 4 l/min.  Will continue to monitor.  Kona Yusuf, RN

## 2017-01-27 NOTE — Progress Notes (Signed)
Patient slept after breathing treatment. No other complains. Stated that she feels good this morning, and she thinks that her legs are less swollen than yesterday.   Will continue to monitor.  Treasa Bradshaw, RN

## 2017-01-27 NOTE — Progress Notes (Signed)
  [facility] Progress Note   Assessment/ Plan:   1. Acute on chronic systolic CHF exacerbation: refractory vol overload, not much movement on weights and fluid despite increasing doses of Lasix.  Will start dialysis based on lengthy discussion with pt and her daughter 2. Acute on chronic CKD--> ESRD: due to solitary functioning kidney and cardiorenal syndrome.  Vein mapping ordered, VVS consulted, greatly appreciate assistance.   3. Anemia: Hgb 8.3, to start aranesp with dialysis 4. 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 Nutrition: adding on albumin 6. Hypertension: on hydral/ imdur 7.  Afib with tachy/brady s/p PPM: on amiodarone and Coumadin 8.  Dispo: CLIP in process.    Subjective:    Not much movement on weights.  Still SOB.    Objective:   BP (!) 163/45 (BP Location: Right Arm)   Pulse 60   Temp 98 F (36.7 C) (Oral)   Resp 18   Ht 4\' 11"  (1.499 m)   Wt 53.2 kg (117 lb 4.8 oz) Comment: c scale  SpO2 99%   BMI 23.69 kg/m   Physical Exam: GEN sitting in chair, somewhat SOB HEENT on O2, NECK + JVD PULM bilateral crackles CV irregular ABD somewhat distended EXT 2+ LE edema NEURO mild asterixis  Labs: BMET  Recent Labs Lab 01/21/17 0424 01/22/17 0441 01/23/17 0245 01/24/17 0523 01/25/17 0339 01/26/17 0536 01/27/17 0454  NA 127* 128* 131* 133* 136 135 136  K 3.9 3.3* 4.2 3.4* 4.1 3.6 3.1*  CL 87* 89* 91* 93* 96* 95* 95*  CO2 30 30 30 28  32 32 32  GLUCOSE 101* 108* 112* 95 106* 92 99  BUN 64* 67* 61* 58* 58* 58* 56*  CREATININE 2.76* 2.64* 2.43* 2.44* 2.31* 2.28* 2.21*  CALCIUM 8.0* 7.7* 8.0* 8.3* 8.7* 9.1 8.9   CBC  Recent Labs Lab 01/22/17 0441 01/23/17 0728 01/24/17 0523 01/25/17 0339  WBC 10.0 9.6 8.9 8.3  HGB 7.6* 8.2* 8.5* 8.3*  HCT 24.3* 26.4* 27.6* 26.9*  MCV 93.5 94.0 95.5 96.4  PLT 266 258 236 241    @IMGRELPRIORS @ Medications:    . acetaminophen  1,000 mg Oral QHS  . amiodarone  200 mg Oral Daily  . amLODipine  5 mg Oral  Daily  . calcium-vitamin D  1 tablet Oral BID  . carvedilol  25 mg Oral BID WC  . escitalopram  5 mg Oral QHS  . folic acid  1 mg Oral Daily  . hydrALAZINE  100 mg Oral TID  . isosorbide mononitrate  60 mg Oral Daily  . magic mouthwash  5 mL Oral TID  . mirtazapine  7.5 mg Oral QHS  . multivitamin with minerals  1 tablet Oral Once per day on Mon Wed Fri  . potassium chloride  40 mEq Oral BID  . pravastatin  80 mg Oral q1800  . sodium chloride flush  3 mL Intravenous Q12H  . vitamin C  500 mg Oral Daily  . warfarin  1.5 mg Oral ONCE-1800  . Warfarin - Pharmacist Dosing Inpatient   Does not apply [place], MD [facility] pgr [phone] 01/27/2017, 11:37 AM

## 2017-01-27 NOTE — Progress Notes (Signed)
Right  Upper Extremity Vein Map    Cephalic  Segment Diameter Depth Comment  1. Axilla 1.20mm 3.11mm   2. Mid upper arm 1.24mm 2.20mm   3. Above AC 1.69mm 1.64mm   4. In [facility] 2.88mm 2.65mm   5. Below AC 2.18mm 2.70mm   6. Mid forearm 1.68mm 4.74mm   7. Wrist 9.0VQ 2.2IV    Basilic  Segment Diameter Depth Comment  2. Mid upper arm 5.75mm 11.73mm   3. Above AC 3.90mm 4.39mm   4. In [facility] 3.57mm 2.59mm   5. Below AC 2.47mm 4.39mm   6. Mid forearm 3.48mm 5.18mm   7. Wrist mm mm Not visualized   Toma Copier, RVS 01/27/2017 6:34 PM

## 2017-01-27 NOTE — Progress Notes (Signed)
Patient reports that breathing has improved after breathing treatment.  Back to normal.  Patient is resting comfortably at the moment.  Will continue to monitor.  Nyshawn Gowdy, RN

## 2017-01-27 NOTE — Progress Notes (Signed)
[name], female   DOB: Oct 16, 1932, 83 y.o.   MRN: [id number]   PROGRESS NOTE   Valerie Terrell  WUJ:[id number] DOB: 06/29/1932 DOA: 01/10/2017  PCP: Tammi Sou, MD  Brief Narrative:  Pt is 83 yo female with known CKD stage III, solitary functional right kidney, admitted after an episodes of fall and was found to have CHF and rising Cr.    Subjective:   Patient in bed, appears comfortable, denies any headache, no fever, no chest pain or pressure, no shortness of breath , no abdominal pain. No focal weakness.   Assessment & Plan:   Acute on chronic systolic chf/ Acute on chronic hypoxic respiratory failure (home o2 3-4liters at baseline): - Nonischemic cardiomyopathy (no regional wall motion on echo, negative stress test),EF of 35-40% by echo in July,  CHF presumed from afib, both nephrology and cardiology were consulted however with diuretics her urine output and negative balance was not acceptable hence nephrology has decided to start hemodialysis for fluid removal on 01/27/2017.  Drowsiness/encephalopathy? -  Drowsiness likely multifactorial including worsening chf with poor brain perfusion, hypoxia, hyponatremia, worsening of bun/cr, delirium from FTT, with supportive care and diuresis. Patient's mentation has improved and she is currently close to baseline. No headache or focal deficits.  Hyponatremia: from fluids overload, resolved.  Hypokalemia; replaced and monitor.  Paroxysmal atrial fibrillation with h/o tachy-brady s/p pacemaker placement in 11/2016: H/o subarachnoid hemorrhage after syncope while on xarelto in 9562, h/o embolic cva while on anticoagulation, Currently paced rhythm, Continue coreg/amiodarone and Coumadin.  Coumadin per pharmacy, Cardiology following.  Acute kidney injury on Chronic kidney disease stage IV:  -solitary kidney (History of a nonfunctioning left kidney with mild right renal artery stenosis demonstrated by duplex ultrasound  October 2017.) For the following and urine output remains low despite high-dose IV Lasix, now nephrology has decided to start hemodialysis.  Anemia -  Normocytic with anemia panel suggesting combination of iron deficiency anemia and anemia of chronic disease, no sign of bleed, last fobt in 11/2016 was negative, FOBT negative. Placed on folate supplementation, no need for transfusion. Nephrology on board.   Essential hypertension: currently on  amlodipine, Lasix, Coreg, hydralazine, Imdur  Undisplaced right ninth rib fracture without complicating feature, multiple old right rib fractures with healing: Pain management, incentive spirometer and supportive care. PT OT evaluation.  L. Forehead abrasion: s/p suture on admission.  RN report started to bleed at around 7pm, advise manual pressure for a longer duration due to patient on coumadin, then pressure dressing. No active bleed today.   Generalized anxiety/depression:  Started Lexapro 5 mg daily at bedtime and mirtazapine 7.5 mg at bedtimeby psychiatrist. Encourage oral intake.  Non-severe (moderate) malnutrition in context of chronic illness Body mass index is 23.83 kg/m. nutriton consulted, on nutrition supplement  FTT: this is her 5 hospitalizations this year, daughter report 40pounds of weight loss this year. PT/OT, SNF placement ( significant care giver exhaustion as well)   Lab Results  Component Value Date   INR 2.20 01/27/2017   INR 1.94 01/26/2017   INR 2.01 01/25/2017      DVT prophylaxis: Coumadin Code Status: Full code Family Communication: Patient Disposition Plan: not ready for discharge, need cardiology and nephrology clearance,   SNF placement at discharge .    Consultants:  Cardiology, signed off on 9/7   Nephrology  Psychiatry, signed off on 9/2  Procedures: None Antimicrobials: Levaquin from admission to 9/5.   Objective: Vitals:   01/27/17 0029 01/27/17  0524 01/27/17 0638 01/27/17 1215    BP:  (!) 163/45  (!) 138/46  Pulse:  60  60  Resp:  18  18  Temp:  98 F (36.7 C)  98.3 F (36.8 C)  TempSrc:  Oral  Oral  SpO2: 98% 99%  99%  Weight:   53.2 kg (117 lb 4.8 oz)   Height:        Intake/Output Summary (Last 24 hours) at 01/27/17 1233 Last data filed at 01/27/17 0920  Gross per 24 hour  Intake              890 ml  Output             1125 ml  Net             -235 ml   Filed Weights   01/25/17 0445 01/26/17 0503 01/27/17 0947  Weight: 52.6 kg (116 lb) 53.1 kg (117 lb) 53.2 kg (117 lb 4.8 oz)   Physical Exam   Awake Alert, Oriented X 3, No new F.N deficits, Normal affect [place].AT,PERRAL Supple Neck,No JVD, No cervical lymphadenopathy appriciated.  Symmetrical Chest wall movement, Good air movement bilaterally, +ve rales RRR,No Gallops,Rubs or new Murmurs, No Parasternal Heave +ve B.Sounds, Abd Soft, No tenderness, No organomegaly appriciated, No rebound - guarding or rigidity. No Cyanosis, Clubbing , trace edema, No new Rash or bruise     Data Reviewed: I have personally reviewed following labs and imaging studies  CBC:  Recent Labs Lab 01/21/17 0424 01/22/17 0441 01/23/17 0728 01/24/17 0523 01/25/17 0339  WBC 8.7 10.0 9.6 8.9 8.3  HGB 7.5* 7.6* 8.2* 8.5* 8.3*  HCT 23.7* 24.3* 26.4* 27.6* 26.9*  MCV 92.9 93.5 94.0 95.5 96.4  PLT 244 266 258 236 096   Basic Metabolic Panel:  Recent Labs Lab 01/23/17 0245 01/24/17 0523 01/25/17 0339 01/26/17 0536 01/27/17 0454  NA 131* 133* 136 135 136  K 4.2 3.4* 4.1 3.6 3.1*  CL 91* 93* 96* 95* 95*  CO2 30 28 32 32 32  GLUCOSE 112* 95 106* 92 99  BUN 61* 58* 58* 58* 56*  CREATININE 2.43* 2.44* 2.31* 2.28* 2.21*  CALCIUM 8.0* 8.3* 8.7* 9.1 8.9   Liver Function Tests:  Recent Labs Lab 01/21/17 0424  AST 22  ALT 16  ALKPHOS 64  BILITOT 0.7  PROT 6.0*  ALBUMIN 2.9*    Recent Labs Lab 01/21/17 0424  AMMONIA 11   Coagulation Profile:  Recent Labs Lab 01/23/17 0245 01/24/17 0523  01/25/17 0339 01/26/17 0536 01/27/17 0454  INR 2.04 1.95 2.01 1.94 2.20   Urine analysis:    Component Value Date/Time   COLORURINE YELLOW 01/20/2017 1737   APPEARANCEUR CLEAR 01/20/2017 1737   LABSPEC 1.012 01/20/2017 1737   PHURINE 5.0 01/20/2017 1737   GLUCOSEU NEGATIVE 01/20/2017 1737   HGBUR NEGATIVE 01/20/2017 1737   BILIRUBINUR NEGATIVE 01/20/2017 1737   BILIRUBINUR negative 09/24/2016 [place] 01/20/2017 1737   PROTEINUR 30 (A) 01/20/2017 1737   UROBILINOGEN 0.2 09/24/2016 1453   NITRITE NEGATIVE 01/20/2017 1737   LEUKOCYTESUR NEGATIVE 01/20/2017 1737   No results found for this or any previous visit (from the past 240 hour(s)).  Radiology Studies:  Dg Chest Port 1 View Result Date: 01/20/2017 CHF with mild interstitial edema. Left lower lobe atelectasis and probable left pleural effusion. The ICD is in stable position. Thoracic aortic atherosclerosis.   Scheduled Meds: . acetaminophen  1,000 mg Oral QHS  . amiodarone  200  mg Oral Daily  . amLODipine  5 mg Oral Daily  . calcium-vitamin D  1 tablet Oral BID  . carvedilol  25 mg Oral BID WC  . escitalopram  5 mg Oral QHS  . folic acid  1 mg Oral Daily  . hydrALAZINE  100 mg Oral TID  . isosorbide mononitrate  60 mg Oral Daily  . magic mouthwash  5 mL Oral TID  . mirtazapine  7.5 mg Oral QHS  . multivitamin with minerals  1 tablet Oral Once per day on Mon Wed Fri  . potassium chloride  40 mEq Oral BID  . pravastatin  80 mg Oral q1800  . sodium chloride flush  3 mL Intravenous Q12H  . vitamin C  500 mg Oral Daily  . warfarin  1.5 mg Oral ONCE-1800  . Warfarin - Pharmacist Dosing Inpatient   Does not apply q1800   Continuous Infusions: . sodium chloride    . furosemide 160 mg (01/27/17 1142)    LOS: 16 days   Time spent: 25 minutes   I have personally reviewed and interpreted on  01/27/2017  daily labs, tele strips, imagings as discussed above under date review session and assessment and  plans.  I reviewed all nursing notes, pharmacy notes, consultant notes,  vitals, pertinent old records  I have discussed plan of care as described above with RN , patient 01/27/2017  Signature  Lala Lund M.D on 01/27/2017 at 12:33 PM  Between 7am to 7pm - Pager - [phone] ( page via [email or web address], text pages only, please mention full 10 digit call back number).  After 7pm go to [email or web address] - password [facility]

## 2017-01-27 NOTE — Consult Note (Signed)
Hospital Consult    Reason for Consult:  Needs HD access Requesting Physician:  Uptom MRN #:  [id number]  History of Present Illness: This is a 83 y.o. female who was admitted on 01/10/17 after after falling.  She has a hx of PAF and is on coumadin.  She hit her forehead when she fell.  She has known CKD and was found to have increased creatinine.  She has had SOB, poor appetite and new tremors and CXR with CHF and her weight has increased.  She has acute on chronic CHF with refractory volume overload and not much improvement on weights despite increasing doses of lasix.  She will need to start dialysis and [facility] is asked to consult for permanent HD access.    She states she has a hx of mild stroke but no residual.  She has Afib with tachy brady and has a PPM.  She is on amiodarone and coumadin.  She has a hx of subarachnoid hemorrhage after syncope while on Xarelto.  She has hx of thoracic compression fracture and hx of kyphoplasty.   Past Medical History:  Diagnosis Date  . Atrophy of left kidney    with absent blood flow by renal artery dopplers (Dr. Gwenlyn Found)  . 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 Chronic combined systolic and diastolic CHF (congestive heart failure) ([place])    a. 11/2016: echo showing EF of 35-40%, RV strain noted, mild MR and mild TR.   [name] Chronic renal insufficiency, stage 4 (severe) (HCC)    Baseline GFR 25 ml/min-- renal u/s showed atrophic/hypoplastic left kidney.  No hydronephrosis.  Small right renal cyst.  . History of adenomatous polyp of colon   . History of subarachnoid hemorrhage 10/2014   after syncope and while on xarelto  . Hyperlipidemia   . Hypertension    Difficult to control, in the setting of one functioning kidney: pt was referred to nephrology by Dr. Gwenlyn Found 06/2015.  . Lumbar radiculopathy 2012  . Lumbar spondylosis    MR 07/2016---no sign of spinal nerve compression or cord compression.  Pt set up with outpt ortho while admitted to hosp  07/2016.  . Metatarsal fracture 06/10/2016   Nondisplaced, left 5th metatarsal--pt was referred to ortho  . Nonischemic cardiomyopathy ([place])   . Osteopenia 2014   T-score -2.1  . PAF (paroxysmal atrial fibrillation) (HCC)    Eliquis started after BRAO and CVA.  She was changed to warfarin 2018.  . Pulmonary edema 11/2016  . Stroke [facility])    cardioembolic (had CVA while on no anticoag)--"scattered subacute punctate infarcts: 1 in R parietal lobe and 2 in occipital cortex" on MRI br.  CT angio head/neck: aortic arch athero.   . Thoracic compression fracture ([place]) 07/2016   T7 and T8-- T8 kyphoplasty during hosp admission 07/2016.  Neuro referred pt to pain mgmt for consideration of injection 09/2016.  I referred her to endo 08/2016 for consideration of calcitonin treatment.    Past Surgical History:  Procedure Laterality Date  . APPENDECTOMY  child  . CARDIOVASCULAR STRESS TEST  01/31/2016   Stress myoview: NORMAL/Low risk.  EF 56%.  . [place]  . COLONOSCOPY  2015   + hx of adenomatous polyps.  Need digest health spec in [facility] records to see when pt due for next colonoscopy  . IR GENERIC HISTORICAL  07/24/2016   IR KYPHO THORACIC WITH BONE BIOPSY 07/24/2016 Luanne Bras, MD [facility]  . KYPHOPLASTY  07/27/2016  T8  . PACEMAKER IMPLANT N/A 12/03/2016   Procedure: Pacemaker Implant;  Surgeon: Evans Lance, MD;  Location: [facility];  Service: Cardiovascular;  Laterality: N/A;  . Renal artery dopplers  02/26/2016   Her right renal dimension was 11 cm pole to pole with mild to moderate right renal artery stenosis. A right renal aortic ratio was 3.22 suggesting less than a 50% stenosis.  . TONSILLECTOMY    . TRANSTHORACIC ECHOCARDIOGRAM  01/29/2016; 11/2016   2017: EF 55-60%, normal LV wall motion, grade I DD.  No cardiac source of emboli was seen.  2018: EF 35-40%, could not assess DD due to a-fib, pulm HTN noted.    Allergies  Allergen Reactions  .  Clonidine Derivatives Palpitations and Other (See Comments)    Very sedated  . Codeine Nausea Only  . Nickel Rash  . Sulfa Antibiotics Other (See Comments)    Reaction unknown    Prior to Admission medications   Medication Sig Start Date End Date Taking? Authorizing Provider  acetaminophen (TYLENOL) 500 MG tablet Take 500 mg by mouth every 6 (six) hours as needed for moderate pain.   Yes [provider]  amiodarone (PACERONE) 200 MG tablet Take 1 tablet (200 mg total) by mouth 2 (two) times daily. 01/05/17  Yes Strader, [place], PA-C  amLODipine (NORVASC) 5 MG tablet Take 1 tablet (5 mg total) by mouth daily. 01/05/17 02/04/17 Yes Strader, Fransisco Hertz, PA-C  Calcium Carb-Cholecalciferol (CALCIUM/VITAMIN D PO) Take 1 tablet by mouth 2 (two) times daily.   Yes [provider]  carvedilol (COREG) 25 MG tablet Take 1 tablet (25 mg total) by mouth 2 (two) times daily with a meal. 12/17/16  Yes Velvet Bathe, MD  furosemide (LASIX) 80 MG tablet Take 2 tablets (160 mg total) by mouth 2 (two) times daily. 12/07/16 01/11/17 Yes Johnson, Clanford L, MD  hydrALAZINE (APRESOLINE) 100 MG tablet Take 1 tablet (100 mg total) by mouth 3 (three) times daily. 08/12/16  Yes McGowen, Adrian Blackwater, MD  ibandronate (BONIVA) 150 MG tablet Take 1 tablet (150 mg total) by mouth every 30 (thirty) days. Take in the morning with a full glass of water, on an empty stomach, and do not take anything else by mouth or lie down for the next 30 min. 08/12/16  Yes McGowen, Adrian Blackwater, MD  isosorbide mononitrate (IMDUR) 60 MG 24 hr tablet Take 1 tablet (60 mg total) by mouth daily. 12/18/16  Yes Velvet Bathe, MD  Multiple Vitamin (MULTIVITAMIN) tablet Take 1 tablet by mouth 3 (three) times a week.    Yes [provider]  ondansetron (ZOFRAN) 4 MG tablet Take 0.5-1 tablets (2-4 mg total) by mouth every 8 (eight) hours as needed for nausea or vomiting. 12/10/16  Yes McGowen, Adrian Blackwater, MD  oxyCODONE-acetaminophen  (PERCOCET/ROXICET) 5-325 MG tablet Take 1-2 tablets by mouth every 6 (six) hours as needed for severe pain. 12/31/16  Yes McGowen, Adrian Blackwater, MD  Potassium Chloride ER 20 MEQ TBCR Take 20 mEq by mouth 2 (two) times daily. 12/31/16  Yes McGowen, Adrian Blackwater, MD  pravastatin (PRAVACHOL) 40 MG tablet Take 40 mg by mouth every evening. 07/16/15  Yes [provider]  vitamin C (ASCORBIC ACID) 500 MG tablet Take 500 mg by mouth daily.   Yes [provider]  Vitamin D, Ergocalciferol, (DRISDOL) 50000 units CAPS capsule 1 cap po q week x 12 weeks Patient taking differently: Take 50,000 Units by mouth every 7 (seven) days. For  12 weeks. (Thursdays) 11/09/16  Yes McGowen, Adrian Blackwater, MD  warfarin (COUMADIN) 3 MG tablet Take 1/2 to 1 tablet by mouth daily as directed by coumadin clinic Patient taking differently: Take 1.5-3 mg by mouth See admin instructions. Take 1/2 tablet on Monday, Wednesday and Friday then take 1 tablet all the other days 12/21/16  Yes Lorretta Harp, MD    Social History   Social History  . Marital status: Widowed    Spouse name: N/A  . Number of children: N/A  . Years of education: N/A   Occupational History  . Not on file.   Social History Main Topics  . Smoking status: Never Smoker  . Smokeless tobacco: Never Used  . Alcohol use 0.6 oz/week    1 Glasses of wine per week     Comment: wine  . Drug use: No  . Sexual activity: No   Other Topics Concern  . Not on file   Social History Narrative   Widow.  One daughter, lives with her.   Educ: college   Occup: retired Marine scientist.   No T/A/Ds.   She is almost a vegetarian.     Family History  Problem Relation Age of Onset  . Cancer Mother   . Heart disease Father   . Eloise Mula death Father   . Sudden Cardiac Death Neg Hx     ROS: [x]  Positive   [ ]  Negative   [ ]  All sytems reviewed and are negative  Cardiac: []  chest pain/pressure []  palpitations [x]  SOB  [x]  DOE  Vascular: []  pain in legs while  walking []  pain in legs at rest []  pain in legs at night []  non-healing ulcers []  hx of DVT [x]  swelling in legs  Pulmonary: []  productive cough []  asthma/wheezing []  home O2  Neurologic: []  weakness in []  arms []  legs []  numbness in []  arms []  legs []  hx of CVA []  mini stroke [] difficulty speaking or slurred speech []  temporary loss of vision in one eye []  dizziness  Hematologic: []  hx of cancer []  bleeding problems []  problems with blood clotting easily  Endocrine:   []  diabetes []  thyroid disease  GI []  vomiting blood []  blood in stool  GU: [x]  CKD/renal failure []  HD--[]  M/W/F or []  T/T/S []  burning with urination []  blood in urine  Psychiatric: []  anxiety []  depression  Musculoskeletal: []  arthritis []  joint pain  Integumentary: []  rashes []  ulcers  Constitutional: []  fever []  chills   Physical Examination  Vitals:   01/27/17 0524 01/27/17 1215  BP: (!) 163/45 (!) 138/46  Pulse: 60 60  Resp: 18 18  Temp: 98 F (36.7 C) 98.3 F (36.8 C)  SpO2: 99% 99%   Body mass index is 23.69 kg/m.  General:  WDWN in NAD Gait: Not observed HENT: WNL, normocephalic Pulmonary: normal non-labored breathing, without Rales, rhonchi,  wheezing Cardiac: regular, without  Murmurs, rubs or gallops; without carotid bruits Abdomen:  soft, NT/ND, no masses Skin: without rashes Vascular Exam/Pulses:  Right Left  Radial 2+ (normal) 2+ (normal)  Ulnar 2+ (normal) 2+ (normal)  DP 2+ (normal) 2+ (normal)  PT Unable to palpate  Unable to palpate    Extremities: IV in left arm; ecchymosis right arm. Musculoskeletal: no muscle wasting or atrophy  Neurologic: A&O X 3;  No focal weakness or paresthesias are detected; speech is fluent/normal Psychiatric:  The pt has Normal affect.   CBC    Component Value Date/Time   WBC 8.3 01/25/2017 0339  RBC 2.79 (L) 01/25/2017 0339   HGB 8.3 (L) 01/25/2017 0339   HGB 12.2 06/17/2016 0819   HCT 26.9 (L) 01/25/2017  0339   HCT 38.4 06/17/2016 0819   PLT 241 01/25/2017 0339   PLT 266 06/17/2016 0819   MCV 96.4 01/25/2017 0339   MCV 94 06/17/2016 0819   MCH 29.7 01/25/2017 0339   MCHC 30.9 01/25/2017 0339   RDW 17.4 (H) 01/25/2017 0339   RDW 14.0 06/17/2016 0819   LYMPHSABS 1.2 01/17/2017 0801   LYMPHSABS 1.2 06/17/2016 0819   MONOABS 0.7 01/17/2017 0801   EOSABS 0.1 01/17/2017 0801   EOSABS 0.3 06/17/2016 0819   BASOSABS 0.0 01/17/2017 0801   BASOSABS 0.0 06/17/2016 0819    BMET    Component Value Date/Time   NA 136 01/27/2017 0454   NA 137 09/01/2016   K 3.1 (L) 01/27/2017 0454   CL 95 (L) 01/27/2017 0454   CO2 32 01/27/2017 0454   GLUCOSE 99 01/27/2017 0454   BUN 56 (H) 01/27/2017 0454   BUN 51 (A) 09/01/2016   CREATININE 2.21 (H) 01/27/2017 0454   CREATININE 1.63 (H) 03/24/2016 0110   CALCIUM 8.9 01/27/2017 0454   GFRNONAA 20 (L) 01/27/2017 0454   GFRAA 23 (L) 01/27/2017 0454    COAGS: Lab Results  Component Value Date   INR 2.20 01/27/2017   INR 1.94 01/26/2017   INR 2.01 01/25/2017     Non-Invasive Vascular Imaging:   BUE vein mapping has been ordered  Statin:  Yes.   Beta Blocker:  Yes.   Aspirin:  No. ACEI:  No. ARB:  No. CCB use:  Yes Other antiplatelets/anticoagulants:  Yes.   Coumadin   ASSESSMENT/PLAN: This is a 83 y.o. female with acute on chronic CKD progressing to ESRD in need of permanent HD access and she is right hand dominant.   -pt has vein mapping pending.   -pt is on coumadin and this will need to be held.  Given that she is on coumadin, most likely will not be able to do surgery until next week.   -Dr. Donnetta Hutching to see pt later today. -she does have an IV in the left arm-this may need to be moved to the right arm pending results of vein mapping.   Leontine Locket, PA-C [facility] [phone]  I have examined the patient, reviewed and agree with above.Discussed with the patient and family present. Patient does feels very  weak and uncomfortable. No acute respiratory distress. Vein mapping reveals relatively small veins bilaterally but may be candidate for left arm basilic vein fistula. Will need tunneled catheter prior to permanent access. INR elevated. Will hold Coumadin today. Check INR tomorrow. Plan tunneled catheter Friday  Curt Jews, MD 01/27/2017 6:59 PM

## 2017-01-27 NOTE — Progress Notes (Signed)
PT Cancellation Note  Patient Details Name: Valerie Terrell MRN: [id number] DOB: Feb 13, 1933   Cancelled Treatment:    Reason Eval/Treat Not Completed: Patient at procedure or test/unavailable PT will continue to follow acutely.    Salina April, PTA Pager: [phone]   01/27/2017, 4:09 PM

## 2017-01-27 NOTE — Progress Notes (Signed)
ANTICOAGULATION CONSULT NOTE - Follow Up Consult  Pharmacy Consult for Coumadin Indication: atrial fibrillation  Allergies  Allergen Reactions  . Clonidine Derivatives Palpitations and Other (See Comments)    Very sedated  . Codeine Nausea Only  . Nickel Rash  . Sulfa Antibiotics Other (See Comments)    Reaction unknown   Patient Measurements: Height: 4\' 11"  (149.9 cm) Weight: 117 lb 4.8 oz (53.2 kg) (c scale) IBW/kg (Calculated) : 43.2  Assessment:  83 yr old female on Coumadin 3mg  daily exc for 1.5mg  on MWF PTA for Afib/CVA. On Amio 200 BID as pta. Labile INR this admit, held several days, then again 9/7 s/p fall with reported active bleeding from head. Per RN, resolved 9/8 AM. INR now mostly stable at 2.2 today. Hgb low but stable at 8.3, plts wnl.   Goal of Therapy:  INR 2-3 Monitor platelets by anticoagulation protocol: Yes   Plan:  Give Coumadin 1.5mg  PO daily x 1 Monitor daily INR, CBC, s/s of bleed  Elenor Quinones, PharmD, [facility] Clinical Pharmacist Pager [phone] 01/27/2017 7:59 AM

## 2017-01-28 LAB — PROTIME-INR
INR: 2.4
Prothrombin Time: 26 seconds — ABNORMAL HIGH (ref 11.4–15.2)

## 2017-01-28 LAB — BASIC METABOLIC PANEL
Anion gap: 7 (ref 5–15)
BUN: 52 mg/dL — AB (ref 6–20)
CO2: 35 mmol/L — AB (ref 22–32)
CREATININE: 1.9 mg/dL — AB (ref 0.44–1.00)
Calcium: 8.8 mg/dL — ABNORMAL LOW (ref 8.9–10.3)
Chloride: 94 mmol/L — ABNORMAL LOW (ref 101–111)
GFR calc non Af Amer: 24 mL/min — ABNORMAL LOW (ref >60)
GFR, EST AFRICAN AMERICAN: 27 mL/min — AB (ref >60)
Glucose, Bld: 90 mg/dL (ref 65–99)
POTASSIUM: 3.4 mmol/L — AB (ref 3.5–5.1)
Sodium: 136 mmol/L (ref 135–145)

## 2017-01-28 MED ORDER — LIDOCAINE-PRILOCAINE 2.5-2.5 % EX CREA: 1.0000 "application " | TOPICAL_CREAM | CUTANEOUS | Status: DC | PRN

## 2017-01-28 MED ORDER — SODIUM CHLORIDE 0.9 % IV SOLN: 100.0000 mL | INTRAVENOUS | Status: DC | PRN

## 2017-01-28 MED ORDER — LIDOCAINE HCL (PF) 1 % IJ SOLN: 5.0000 mL | INTRAMUSCULAR | Status: DC | PRN

## 2017-01-28 MED ORDER — POTASSIUM CHLORIDE CRYS ER 20 MEQ PO TBCR
40.0000 meq | EXTENDED_RELEASE_TABLET | Freq: Once | ORAL | Status: AC
  Administered 2017-01-28: 40 meq via ORAL
  Filled 2017-01-28: qty 2

## 2017-01-28 MED ORDER — PENTAFLUOROPROP-TETRAFLUOROETH EX AERO: 1.0000 "application " | INHALATION_SPRAY | CUTANEOUS | Status: DC | PRN

## 2017-01-28 MED ORDER — CEFAZOLIN SODIUM-DEXTROSE 1-4 GM/50ML-% IV SOLN
1.0000 g | INTRAVENOUS | Status: AC
  Administered 2017-01-29: 1 g via INTRAVENOUS
  Filled 2017-01-28: qty 50

## 2017-01-28 MED ORDER — ALTEPLASE 2 MG IJ SOLR: 2.0000 mg | Freq: Once | INTRAMUSCULAR | Status: DC | PRN

## 2017-01-28 MED ORDER — HEPARIN SODIUM (PORCINE) 1000 UNIT/ML DIALYSIS
1000.0000 [IU] | INTRAMUSCULAR | Status: DC | PRN
  Filled 2017-01-28: qty 1

## 2017-01-28 NOTE — Progress Notes (Signed)
[name], female   DOB: 1933-05-09, 83 y.o.   MRN: [id number]   PROGRESS NOTE   Valerie Terrell  DUK:[id number] DOB: 1932-07-19 DOA: 01/10/2017  PCP: Tammi Sou, MD  Brief Narrative:  Pt is 83 yo female with known CKD stage III, solitary functional right kidney, admitted after an episodes of fall and was found to have CHF and rising Cr.    Subjective:  Patient in bed, appears comfortable, denies any headache, no fever, no chest pain or pressure,mildly improved shortness of breath , no abdominal pain. No focal weakness.    Assessment & Plan:   Acute on chronic systolic chf/ Acute on chronic hypoxic respiratory failure (home o2 3-4liters at baseline): - Nonischemic cardiomyopathy (no regional wall motion on echo, negative stress test),EF of 35-40% by echo in July,  CHF presumed from afib, both nephrology and cardiology were consulted she is on high doses of IV Lasix being monitored by nephrology, diuresing slightly better than before but has had multiple hospital admissions due to overall poor urine output and fluid removal, now plan is to start her on hemodialysis for fluid removal, vascular surgery has been consulted she underwent vein mapping and due for tunneled dialysis catheter placement by vascular surgery soon.   Drowsiness/encephalopathy? -  Drowsiness likely multifactorial including worsening chf with poor brain perfusion, hypoxia, hyponatremia, worsening of bun/cr, delirium from FTT, with supportive care and diuresis. Patient's mentation has improved and she is currently close to baseline. No headache or focal deficits.  Hyponatremia: from fluid overload, resolved.  Hypokalemia; replaced and monitor.  Paroxysmal atrial fibrillation with h/o tachy-brady s/p pacemaker placement in 11/2016: H/o subarachnoid hemorrhage after syncope while on xarelto in 3762, h/o embolic cva while on anticoagulation, Currently paced rhythm, Continue coreg/amiodarone and Coumadin.   Coumadin per pharmacy, Cardiology following.  Acute kidney injury on Chronic kidney disease stage IV:  -solitary kidney (History of a nonfunctioning left kidney with mild right renal artery stenosis demonstrated by duplex ultrasound October 2017.) For the following and urine output remains low despite high-dose IV Lasix, now nephrology has decided to start hemodialysis.  Anemia -  Normocytic with anemia panel suggesting combination of iron deficiency anemia and anemia of chronic disease, no sign of bleed, last fobt in 11/2016 was negative, FOBT negative. Placed on folate supplementation, no need for transfusion. Nephrology on board.   Essential hypertension: currently on  amlodipine, Lasix, Coreg, hydralazine, Imdur  Undisplaced right ninth rib fracture without complicating feature, multiple old right rib fractures with healing: Pain management, incentive spirometer and supportive care. PT OT evaluation.  L. Forehead abrasion: s/p suture on admission.  RN report started to bleed at around 7pm, advise manual pressure for a longer duration due to patient on coumadin, then pressure dressing. No active bleed today.   Generalized anxiety/depression:  Started Lexapro 5 mg daily at bedtime and mirtazapine 7.5 mg at bedtimeby psychiatrist. Encourage oral intake.  Non-severe (moderate) malnutrition in context of chronic illness Body mass index is 23.83 kg/m. nutriton consulted, on nutrition supplement  FTT: this is her 5 hospitalizations this year, daughter report 40pounds of weight loss this year. PT/OT, SNF placement ( significant care giver exhaustion as well)   Lab Results  Component Value Date   INR 2.40 01/28/2017   INR 2.20 01/27/2017   INR 1.94 01/26/2017    DVT prophylaxis: Coumadin Code Status: Full code Family Communication: Daughter bedside on 01/28/2017 Disposition Plan: not ready for discharge, need cardiology and nephrology clearance,   SNF  placement at discharge  .    Consultants:  Cardiology, signed off on 9/7   Nephrology  Psychiatry, signed off on 9/2  VVS  Procedures: None Antimicrobials: Levaquin from admission to 9/5.   Objective: Vitals:   01/27/17 0638 01/27/17 1215 01/27/17 2122 01/28/17 0629  BP:  (!) 138/46 (!) 163/50 (!) 170/47  Pulse:  60 60 63  Resp:  18 19 18   Temp:  98.3 F (36.8 C) 97.8 F (36.6 C) 97.8 F (36.6 C)  TempSrc:  Oral Oral Oral  SpO2:  99% 99% 98%  Weight: 53.2 kg (117 lb 4.8 oz)   53.2 kg (117 lb 4.8 oz)  Height:        Intake/Output Summary (Last 24 hours) at 01/28/17 1219 Last data filed at 01/28/17 1109  Gross per 24 hour  Intake              650 ml  Output             1521 ml  Net             -871 ml   Filed Weights   01/26/17 0503 01/27/17 0638 01/28/17 0629  Weight: 53.1 kg (117 lb) 53.2 kg (117 lb 4.8 oz) 53.2 kg (117 lb 4.8 oz)   Physical Exam   Awake Alert, Oriented X 3, No new F.N deficits, Normal affect [place].AT,PERRAL Supple Neck,No JVD, No cervical lymphadenopathy appriciated.  Symmetrical Chest wall movement, Good air movement bilaterally, few rales RRR,No Gallops,Rubs or new Murmurs, No Parasternal Heave +ve B.Sounds, Abd Soft, No tenderness, No organomegaly appriciated, No rebound - guarding or rigidity. No Cyanosis, Clubbing, trace leg edema, No new Rash or bruise   Data Reviewed: I have personally reviewed following labs and imaging studies  CBC:  Recent Labs Lab 01/22/17 0441 01/23/17 0728 01/24/17 0523 01/25/17 0339  WBC 10.0 9.6 8.9 8.3  HGB 7.6* 8.2* 8.5* 8.3*  HCT 24.3* 26.4* 27.6* 26.9*  MCV 93.5 94.0 95.5 96.4  PLT 266 258 236 947   Basic Metabolic Panel:  Recent Labs Lab 01/24/17 0523 01/25/17 0339 01/26/17 0536 01/27/17 0454 01/28/17 0524  NA 133* 136 135 136 136  K 3.4* 4.1 3.6 3.1* 3.4*  CL 93* 96* 95* 95* 94*  CO2 28 32 32 32 35*  GLUCOSE 95 106* 92 99 90  BUN 58* 58* 58* 56* 52*  CREATININE 2.44* 2.31* 2.28* 2.21* 1.90*   CALCIUM 8.3* 8.7* 9.1 8.9 8.8*   Liver Function Tests:  Recent Labs Lab 01/27/17 0454  ALBUMIN 2.9*   No results for input(s): AMMONIA in the last 168 hours. Coagulation Profile:  Recent Labs Lab 01/24/17 0523 01/25/17 0339 01/26/17 0536 01/27/17 0454 01/28/17 0524  INR 1.95 2.01 1.94 2.20 2.40   Urine analysis:    Component Value Date/Time   COLORURINE YELLOW 01/20/2017 [place] 01/20/2017 1737   LABSPEC 1.012 01/20/2017 1737   PHURINE 5.0 01/20/2017 1737   GLUCOSEU NEGATIVE 01/20/2017 1737   HGBUR NEGATIVE 01/20/2017 1737   BILIRUBINUR NEGATIVE 01/20/2017 1737   BILIRUBINUR negative 09/24/2016 [place] 01/20/2017 1737   PROTEINUR 30 (A) 01/20/2017 1737   UROBILINOGEN 0.2 09/24/2016 1453   NITRITE NEGATIVE 01/20/2017 1737   LEUKOCYTESUR NEGATIVE 01/20/2017 1737   No results found for this or any previous visit (from the past 240 hour(s)).  Radiology Studies:  Dg Chest Port 1 View Result Date: 01/20/2017 CHF with mild interstitial edema. Left lower lobe atelectasis and probable  left pleural effusion. The ICD is in stable position. Thoracic aortic atherosclerosis.   Scheduled Meds: . acetaminophen  1,000 mg Oral QHS  . amiodarone  200 mg Oral Daily  . amLODipine  5 mg Oral Daily  . calcium-vitamin D  1 tablet Oral BID  . carvedilol  25 mg Oral BID WC  . escitalopram  5 mg Oral QHS  . folic acid  1 mg Oral Daily  . hydrALAZINE  100 mg Oral TID  . isosorbide mononitrate  60 mg Oral Daily  . magic mouthwash  5 mL Oral TID  . mirtazapine  7.5 mg Oral QHS  . multivitamin with minerals  1 tablet Oral Once per day on Mon Wed Fri  . pravastatin  80 mg Oral q1800  . sodium chloride flush  3 mL Intravenous Q12H  . vitamin C  500 mg Oral Daily   Continuous Infusions: . sodium chloride    . sodium chloride    . sodium chloride    . [START ON 01/29/2017]  ceFAZolin (ANCEF) IV    . furosemide Stopped (01/28/17 1133)    LOS: 17  days   Time spent: 25 minutes    Signature  Lala Lund M.D on 01/28/2017 at 12:19 PM  Between 7am to 7pm - Pager - [phone] ( page via [email or web address], text pages only, please mention full 10 digit call back number).  After 7pm go to [email or web address] - password [facility]

## 2017-01-28 NOTE — Clinical Social Work Note (Addendum)
CSW notified admissions coordinator at [facility] of plan for starting HD process. She stated that they are only able to transport to HD centers in [facility].  Dayton Scrape, [place] [phone]  10:01 am CSW notified patient and her daughter of the above. They stated they were now wanting to return home after discharge with PT/OT, RN, and aide. Patient had home health services with [facility] prior to admission. RNCM notified. Patient will have permanent HD access placed next week. CSW will continue to follow in case plans change back to SNF.  Dayton Scrape, [place]

## 2017-01-28 NOTE — Progress Notes (Signed)
Physical Therapy Treatment Patient Details Name: Valerie Terrell MRN: [id number] DOB: 1933/02/19 Today's Date: 01/28/2017    History of Present Illness Pt is an 83 y.o. female who presented to the ED for evaluation of bleeding head wound and R back pain after a fall. In the ED, hypoxic at 88% on her supplemental oxygen, tachypneic, mildly hypertensive, chest x-ray showed nondisplaced right ninth rib fracture without complicating features as well as multiple old right rib fractures with healing. She was briefly placed on nonrebreather mask. Admitted for acute on chronic hypoxic respiratory failure secondary to acute on chronic systolic and diastolic CHF and chest wall splinting related to pain from rib fracture. PMH significant for: chronic combined diastolic and systolic CHF, NICM, stage IV chronic kidney disease, CAD, PAF on warfarin, home oxygen 3-4 L/m, HLD, HTN, and CVA.    PT Comments    Patient tolerated increased activity this session without SOB and with min A for safe OOB mobility. Pt was able to ambulate 16ft with SpO2 >94% on 4L O2 via [address]. Continue to progress as tolerated.    Follow Up Recommendations  Supervision/Assistance - 24 hour;SNF     Equipment Recommendations  Other (comment) (TBD next venue)    Recommendations for Other Services       Precautions / Restrictions Precautions Precautions: Fall    Mobility  Bed Mobility Overal bed mobility: Needs Assistance Bed Mobility: Supine to Sit     Supine to sit: Min guard     General bed mobility comments: min guard for safety; increased time and effort  Transfers Overall transfer level: Needs assistance Equipment used: Rolling walker (2 wheeled) Transfers: Sit to/from [facility] Sit to Stand: Min assist Stand pivot transfers: Min assist       General transfer comment: assist for balance  Ambulation/Gait Ambulation/Gait assistance: Min assist;+2 safety/equipment (chair follow) Ambulation  Distance (Feet): 120 Feet Assistive device: Rolling walker (2 wheeled) Gait Pattern/deviations: Trunk flexed;Step-through pattern;Decreased stride length;Narrow base of support Gait velocity: decreased   General Gait Details: assist to for balance and management of RW; cues for posture, increased bilat step length, and cadence   Stairs            Wheelchair Mobility    Modified Rankin (Stroke Patients Only)       Balance Overall balance assessment: Needs assistance Sitting-balance support: Bilateral upper extremity supported Sitting balance-Leahy Scale: Fair     Standing balance support: Single extremity supported Standing balance-Leahy Scale: Poor                              Cognition Arousal/Alertness: Awake/alert Behavior During Therapy: WFL for tasks assessed/performed Overall Cognitive Status: History of cognitive impairments - at baseline                                        Exercises      General Comments General comments (skin integrity, edema, etc.): SpO2 >93% on 4L O2 via [place] throughout session      Pertinent Vitals/Pain Pain Assessment: No/denies pain    Home Living                      Prior Function            PT Goals (current goals can now be found in the care plan section) Acute  Rehab PT Goals Patient Stated Goal: to go home PT Goal Formulation: With patient Time For Goal Achievement: 01/20/17 Potential to Achieve Goals: Fair Progress towards PT goals: Progressing toward goals    Frequency    Min 3X/week      PT Plan Current plan remains appropriate    Co-evaluation              AM-PAC PT "6 Clicks" Daily Activity  Outcome Measure  Difficulty turning over in bed (including adjusting bedclothes, sheets and blankets)?: A Lot Difficulty moving from lying on back to sitting on the side of the bed? : Unable Difficulty sitting down on and standing up from a chair with arms (e.g.,  wheelchair, bedside commode, etc,.)?: Unable Help needed moving to and from a bed to chair (including a wheelchair)?: A Little Help needed walking in hospital room?: A Little Help needed climbing 3-5 steps with a railing? : A Lot 6 Click Score: 12    End of Session Equipment Utilized During Treatment: Gait belt;Oxygen Activity Tolerance: Patient tolerated treatment well Patient left: with call bell/phone within reach;in chair;with chair alarm set Nurse Communication: Mobility status PT Visit Diagnosis: Unsteadiness on feet (R26.81);Other abnormalities of gait and mobility (R26.89);Repeated falls (R29.6);Muscle weakness (generalized) (M62.81);History of falling (Z91.81);Difficulty in walking, not elsewhere classified (R26.2)     Time: 2820-6015 PT Time Calculation (min) (ACUTE ONLY): 25 min  Charges:  $Gait Training: 8-22 mins $Therapeutic Activity: 8-22 mins                    G Codes:       Earney Navy, PTA Pager: [phone]     Darliss Cheney 01/28/2017, 4:33 PM

## 2017-01-28 NOTE — Progress Notes (Signed)
  [facility] Progress Note   Assessment/ Plan:   1. Acute on chronic systolic CHF exacerbation: refractory vol overload, not much movement on weights and fluid despite increasing doses of Lasix.  Will start dialysis based on lengthy discussion with pt and her daughter.  Note that Cr not a great marker of actual eGFR due to advanced age and lower muscle mass.   2. Acute on chronic CKD--> ESRD: due to solitary functioning kidney and cardiorenal syndrome.  Vein mapping ordered, VVS consulted, greatly appreciate assistance- for tunneled HD cath tomorrow.   3. Anemia: Hgb 8.3, to start aranesp with dialysis 4. 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 Nutrition: albumin 2.9, prostat 6. Hypertension: on hydral/ imdur 7.  Afib with tachy/brady s/p PPM: on amiodarone , Coumadin on hold for procedures 8.  Dispo: CLIP in process.    Subjective:    Little change.     Objective:   BP (!) 170/47 (BP Location: Left Arm)   Pulse 63   Temp 97.8 F (36.6 C) (Oral)   Resp 18   Ht '4\' 11"'$  (1.499 m)   Wt 53.2 kg (117 lb 4.8 oz) Comment: c scale  SpO2 98%   BMI 23.69 kg/m   Physical Exam: GEN sitting in bed, SOB HEENT on O2. NECK + JVD PULM bilateral crackles CV irregular ABD somewhat distended EXT 1-2+ LE edema NEURO mild asterixis  Labs: BMET  Recent Labs Lab 01/22/17 0441 01/23/17 0245 01/24/17 0523 01/25/17 0339 01/26/17 0536 01/27/17 0454 01/28/17 0524  NA 128* 131* 133* 136 135 136 136  K 3.3* 4.2 3.4* 4.1 3.6 3.1* 3.4*  CL 89* 91* 93* 96* 95* 95* 94*  CO2 '30 30 28 '$ 32 32 32 35*  GLUCOSE 108* 112* 95 106* 92 99 90  BUN 67* 61* 58* 58* 58* 56* 52*  CREATININE 2.64* 2.43* 2.44* 2.31* 2.28* 2.21* 1.90*  CALCIUM 7.7* 8.0* 8.3* 8.7* 9.1 8.9 8.8*   CBC  Recent Labs Lab 01/22/17 0441 01/23/17 0728 01/24/17 0523 01/25/17 0339  WBC 10.0 9.6 8.9 8.3  HGB 7.6* 8.2* 8.5* 8.3*  HCT 24.3* 26.4* 27.6* 26.9*  MCV 93.5 94.0 95.5 96.4  PLT 266 258 236 241     '@IMGRELPRIORS'$ @ Medications:    . acetaminophen  1,000 mg Oral QHS  . amiodarone  200 mg Oral Daily  . amLODipine  5 mg Oral Daily  . calcium-vitamin D  1 tablet Oral BID  . carvedilol  25 mg Oral BID WC  . escitalopram  5 mg Oral QHS  . folic acid  1 mg Oral Daily  . hydrALAZINE  100 mg Oral TID  . isosorbide mononitrate  60 mg Oral Daily  . magic mouthwash  5 mL Oral TID  . mirtazapine  7.5 mg Oral QHS  . multivitamin with minerals  1 tablet Oral Once per day on Mon Wed Fri  . pravastatin  80 mg Oral q1800  . sodium chloride flush  3 mL Intravenous Q12H  . vitamin C  500 mg Oral Daily     Madelon Lips, MD [place] pgr [phone] 01/28/2017, 11:05 AM

## 2017-01-28 NOTE — Progress Notes (Signed)
CM following for DCP; patient is arranged with [facility] services as requested by pt/ daughter with [place]; Mindi Slicker [facility] [phone]

## 2017-01-28 NOTE — Progress Notes (Signed)
Occupational Therapy Treatment Patient Details Name: Valerie Terrell MRN: [id number] DOB: Jan 20, 1933 Today's Date: 01/28/2017    History of present illness Pt is an 83 y.o. female who presented to the ED for evaluation of bleeding head wound and R back pain after a fall. In the ED, hypoxic at 88% on her supplemental oxygen, tachypneic, mildly hypertensive, chest x-ray showed nondisplaced right ninth rib fracture without complicating features as well as multiple old right rib fractures with healing. She was briefly placed on nonrebreather mask. Admitted for acute on chronic hypoxic respiratory failure secondary to acute on chronic systolic and diastolic CHF and chest wall splinting related to pain from rib fracture. PMH significant for: chronic combined diastolic and systolic CHF, NICM, stage IV chronic kidney disease, CAD, PAF on warfarin, home oxygen 3-4 L/m, HLD, HTN, and CVA.   OT comments  Goal added for BUE HEP  Follow Up Recommendations  SNF;Supervision/Assistance - 24 hour    Equipment Recommendations  Other (comment) (Defer to next venue)       Precautions / Restrictions Precautions Precautions: Fall Restrictions Weight Bearing Restrictions: No       Mobility Bed Mobility               General bed mobility comments: min A to reposition in bed. Pt leaning left  Transfers                 General transfer comment: did not perform    Balance                                           ADL either performed or assessed with clinical judgement   ADL Overall ADL's : Needs assistance/impaired                                       General ADL Comments: pt in bed and declined OOB despite much encouragement.  Pt did agree to BUE arm exercises. goal added. Expained benefits of BUE AROM and HEP.  Pt states she is sore and OT encouraged her to perform to A with soreness     Vision Patient Visual Report: No change from baseline      Perception     Praxis      Cognition Arousal/Alertness: Awake/alert Behavior During Therapy: WFL for tasks assessed/performed Overall Cognitive Status: History of cognitive impairments - at baseline                                          Exercises Shoulder Exercises Shoulder Flexion: AROM;Both;10 reps Elbow Flexion: AROM;Both;10 reps Elbow Extension: AROM;Both;10 reps Wrist Flexion: AROM;10 reps Wrist Extension: AROM;10 reps Digit Composite Flexion: AROM;Both Composite Extension: AROM;Both   Shoulder Instructions       General Comments      Pertinent Vitals/ Pain       Pain Assessment: Faces Faces Pain Scale: Hurts a little bit Pain Location: arms with AROM Pain Descriptors / Indicators: Sore         Frequency  Min 2X/week        Progress Toward Goals  OT Goals(current goals can now be found in the care plan section)  Progress towards OT goals: Progressing  toward goals     Plan Discharge plan remains appropriate    Co-evaluation                 AM-PAC PT "6 Clicks" Daily Activity     Outcome Measure   Help from another person eating meals?: None Help from another person taking care of personal grooming?: A Little Help from another person toileting, which includes using toliet, bedpan, or urinal?: A Lot Help from another person bathing (including washing, rinsing, drying)?: A Lot Help from another person to put on and taking off regular upper body clothing?: A Little Help from another person to put on and taking off regular lower body clothing?: A Lot 6 Click Score: 16    End of Session Equipment Utilized During Treatment:  (PT came into room prior to pt ambulation)  OT Visit Diagnosis: Unsteadiness on feet (R26.81);Muscle weakness (generalized) (M62.81);Pain Pain - Right/Left: Right Pain - part of body:  (Back/ribs)   Activity Tolerance Patient limited by fatigue   Patient Left with call bell/phone within reach;in  bed;with bed alarm set   Nurse Communication          Time: 2217-9810 OT Time Calculation (min): 15 min  Charges: OT General Charges $OT Visit: 1 Visit OT Treatments $Therapeutic Exercise: 8-22 mins  [place], [place]   Betsy Pries 01/28/2017, 11:58 AM

## 2017-01-28 NOTE — Progress Notes (Signed)
[name], female   DOB: [birth year], 83 y.o.   MRN: [id number] INR 2.4 today.  Planning HD cath tomorrow if INR normalized.  Coumadin on hold

## 2017-01-29 ENCOUNTER — Inpatient Hospital Stay (HOSPITAL_COMMUNITY): Payer: Medicare Other

## 2017-01-29 ENCOUNTER — Encounter (HOSPITAL_COMMUNITY)
Admission: EM | Disposition: A | Discharge status: Home Health | Payer: Self-pay | Source: Home / Self Care | Attending: Internal Medicine

## 2017-01-29 ENCOUNTER — Encounter (HOSPITAL_COMMUNITY): Payer: Self-pay | Admitting: Anesthesiology

## 2017-01-29 ENCOUNTER — Inpatient Hospital Stay (HOSPITAL_COMMUNITY): Payer: Medicare Other | Admitting: Anesthesiology

## 2017-01-29 HISTORY — PX: INSERTION OF DIALYSIS CATHETER: SHX1324

## 2017-01-29 LAB — CBC
HEMATOCRIT: 29.7 % — AB (ref 36.0–46.0)
HEMOGLOBIN: 8.8 g/dL — AB (ref 12.0–15.0)
MCH: 28.9 pg (ref 26.0–34.0)
MCHC: 29.6 g/dL — AB (ref 30.0–36.0)
MCV: 97.4 fL (ref 78.0–100.0)
Platelets: 216 10*3/uL (ref 150–400)
RBC: 3.05 MIL/uL — ABNORMAL LOW (ref 3.87–5.11)
RDW: 16.8 % — ABNORMAL HIGH (ref 11.5–15.5)
WBC: 7.6 10*3/uL (ref 4.0–10.5)

## 2017-01-29 LAB — BASIC METABOLIC PANEL
ANION GAP: 9 (ref 5–15)
BUN: 48 mg/dL — ABNORMAL HIGH (ref 6–20)
CO2: 35 mmol/L — AB (ref 22–32)
Calcium: 8.7 mg/dL — ABNORMAL LOW (ref 8.9–10.3)
Chloride: 93 mmol/L — ABNORMAL LOW (ref 101–111)
Creatinine, Ser: 1.89 mg/dL — ABNORMAL HIGH (ref 0.44–1.00)
GFR calc Af Amer: 28 mL/min — ABNORMAL LOW (ref >60)
GFR, EST NON AFRICAN AMERICAN: 24 mL/min — AB (ref >60)
GLUCOSE: 89 mg/dL (ref 65–99)
POTASSIUM: 3.6 mmol/L (ref 3.5–5.1)
Sodium: 137 mmol/L (ref 135–145)

## 2017-01-29 LAB — SURGICAL PCR SCREEN
MRSA, PCR: NEGATIVE
STAPHYLOCOCCUS AUREUS: NEGATIVE

## 2017-01-29 LAB — PROTIME-INR
INR: 2.04
Prothrombin Time: 22.9 seconds — ABNORMAL HIGH (ref 11.4–15.2)

## 2017-01-29 LAB — HEPATITIS B SURFACE ANTIGEN: HEP B S AG: NEGATIVE

## 2017-01-29 LAB — ALT: ALT: 13 U/L — ABNORMAL LOW (ref 14–54)

## 2017-01-29 SURGERY — INSERTION OF DIALYSIS CATHETER
Anesthesia: General | Site: Neck | Laterality: Right

## 2017-01-29 MED ORDER — SODIUM CHLORIDE 0.9 % IV SOLN
INTRAVENOUS | Status: DC | PRN
  Administered 2017-01-29: 09:00:00

## 2017-01-29 MED ORDER — WARFARIN - PHARMACIST DOSING INPATIENT
Freq: Every day | Status: DC
  Administered 2017-01-29: 18:00:00
  Administered 2017-02-01: 1

## 2017-01-29 MED ORDER — DARBEPOETIN ALFA 100 MCG/0.5ML IJ SOSY
100.0000 ug | PREFILLED_SYRINGE | INTRAMUSCULAR | Status: DC
  Administered 2017-01-30: 100 ug via INTRAVENOUS
  Filled 2017-01-29: qty 0.5

## 2017-01-29 MED ORDER — PROPOFOL 10 MG/ML IV BOLUS
INTRAVENOUS | Status: AC
Start: 2017-01-29 — End: 2017-01-29
  Filled 2017-01-29: qty 20

## 2017-01-29 MED ORDER — LIDOCAINE HCL (CARDIAC) 20 MG/ML IV SOLN
INTRAVENOUS | Status: DC | PRN
  Administered 2017-01-29: 40 mg via INTRATRACHEAL

## 2017-01-29 MED ORDER — ONDANSETRON HCL 4 MG/2ML IJ SOLN
INTRAMUSCULAR | Status: DC | PRN
  Administered 2017-01-29: 4 mg via INTRAVENOUS

## 2017-01-29 MED ORDER — ONDANSETRON HCL 4 MG/2ML IJ SOLN
INTRAMUSCULAR | Status: AC
  Filled 2017-01-29: qty 2

## 2017-01-29 MED ORDER — FENTANYL CITRATE (PF) 250 MCG/5ML IJ SOLN
INTRAMUSCULAR | Status: AC
  Filled 2017-01-29: qty 5

## 2017-01-29 MED ORDER — LIDOCAINE 2% (20 MG/ML) 5 ML SYRINGE
INTRAMUSCULAR | Status: AC
  Filled 2017-01-29: qty 5

## 2017-01-29 MED ORDER — FENTANYL CITRATE (PF) 250 MCG/5ML IJ SOLN
INTRAMUSCULAR | Status: DC | PRN
  Administered 2017-01-29: 50 ug via INTRAVENOUS

## 2017-01-29 MED ORDER — ONDANSETRON HCL 4 MG/2ML IJ SOLN: 4.0000 mg | Freq: Once | INTRAMUSCULAR | Status: DC | PRN

## 2017-01-29 MED ORDER — SODIUM CHLORIDE 0.9 % IV SOLN: INTRAVENOUS | Status: DC

## 2017-01-29 MED ORDER — WARFARIN SODIUM 2 MG PO TABS
4.0000 mg | ORAL_TABLET | Freq: Once | ORAL | Status: AC
  Administered 2017-01-29: 4 mg via ORAL
  Filled 2017-01-29: qty 2

## 2017-01-29 MED ORDER — 0.9 % SODIUM CHLORIDE (POUR BTL) OPTIME
TOPICAL | Status: DC | PRN
  Administered 2017-01-29: 1000 mL

## 2017-01-29 MED ORDER — HEPARIN SODIUM (PORCINE) 1000 UNIT/ML IJ SOLN
INTRAMUSCULAR | Status: AC
  Filled 2017-01-29: qty 1

## 2017-01-29 MED ORDER — LIDOCAINE-EPINEPHRINE 0.5 %-1:200000 IJ SOLN
INTRAMUSCULAR | Status: DC | PRN
  Administered 2017-01-29: 50 mL

## 2017-01-29 MED ORDER — FENTANYL CITRATE (PF) 100 MCG/2ML IJ SOLN: 25.0000 ug | INTRAMUSCULAR | Status: DC | PRN

## 2017-01-29 MED ORDER — HEPARIN SODIUM (PORCINE) 1000 UNIT/ML IJ SOLN
INTRAMUSCULAR | Status: DC | PRN
  Administered 2017-01-29: 1000 [IU]

## 2017-01-29 MED ORDER — LIDOCAINE-EPINEPHRINE 0.5 %-1:200000 IJ SOLN
INTRAMUSCULAR | Status: AC
  Filled 2017-01-29: qty 1

## 2017-01-29 MED ORDER — PHENYLEPHRINE 40 MCG/ML (10ML) SYRINGE FOR IV PUSH (FOR BLOOD PRESSURE SUPPORT)
PREFILLED_SYRINGE | INTRAVENOUS | Status: AC
  Filled 2017-01-29: qty 10

## 2017-01-29 MED ORDER — PROPOFOL 10 MG/ML IV BOLUS
INTRAVENOUS | Status: DC | PRN
  Administered 2017-01-29: 80 mg via INTRAVENOUS

## 2017-01-29 SURGICAL SUPPLY — 44 items
ADH SKN CLS APL DERMABOND .7 (GAUZE/BANDAGES/DRESSINGS) ×1
BAG DECANTER FOR FLEXI CONT (MISCELLANEOUS) ×3 IMPLANT
BIOPATCH RED 1 DISK 7.0 (GAUZE/BANDAGES/DRESSINGS) ×2 IMPLANT
BIOPATCH RED 1IN DISK 7.0MM (GAUZE/BANDAGES/DRESSINGS) ×1
CATH PALINDROME RT-P 15FX19CM (CATHETERS) IMPLANT
CATH PALINDROME RT-P 15FX23CM (CATHETERS) ×2 IMPLANT
CATH PALINDROME RT-P 15FX28CM (CATHETERS) IMPLANT
CATH PALINDROME RT-P 15FX55CM (CATHETERS) IMPLANT
COVER PROBE W GEL 5X96 (DRAPES) ×2 IMPLANT
COVER SURGICAL LIGHT HANDLE (MISCELLANEOUS) ×3 IMPLANT
DECANTER SPIKE VIAL GLASS SM (MISCELLANEOUS) ×3 IMPLANT
DERMABOND ADVANCED (GAUZE/BANDAGES/DRESSINGS) ×2
DERMABOND ADVANCED .7 DNX12 (GAUZE/BANDAGES/DRESSINGS) IMPLANT
DRAPE C-ARM 42X72 X-RAY (DRAPES) ×3 IMPLANT
DRAPE CHEST BREAST 15X10 FENES (DRAPES) ×3 IMPLANT
GLOVE BIOGEL PI IND STRL 6 (GLOVE) IMPLANT
GLOVE BIOGEL PI IND STRL 6.5 (GLOVE) IMPLANT
GLOVE BIOGEL PI INDICATOR 6 (GLOVE) ×2
GLOVE BIOGEL PI INDICATOR 6.5 (GLOVE) ×2
GLOVE SS BIOGEL STRL SZ 7.5 (GLOVE) ×1 IMPLANT
GLOVE SUPERSENSE BIOGEL SZ 7.5 (GLOVE) ×2
GLOVE SURG SS PI 6.5 STRL IVOR (GLOVE) ×2 IMPLANT
GOWN STRL REUS W/ TWL LRG LVL3 (GOWN DISPOSABLE) ×2 IMPLANT
GOWN STRL REUS W/TWL LRG LVL3 (GOWN DISPOSABLE) ×6
KIT BASIN OR (CUSTOM PROCEDURE TRAY) ×3 IMPLANT
KIT ROOM TURNOVER OR (KITS) ×3 IMPLANT
NDL 18GX1X1/2 (RX/OR ONLY) (NEEDLE) ×1 IMPLANT
NDL HYPO 25GX1X1/2 BEV (NEEDLE) ×1 IMPLANT
NEEDLE 18GX1X1/2 (RX/OR ONLY) (NEEDLE) ×3 IMPLANT
NEEDLE 22X1 1/2 (OR ONLY) (NEEDLE) IMPLANT
NEEDLE HYPO 25GX1X1/2 BEV (NEEDLE) ×3 IMPLANT
NS IRRIG 1000ML POUR BTL (IV SOLUTION) ×3 IMPLANT
PACK SURGICAL SETUP 50X90 (CUSTOM PROCEDURE TRAY) ×3 IMPLANT
PAD ARMBOARD 7.5X6 YLW CONV (MISCELLANEOUS) ×6 IMPLANT
SOAP 2 % CHG 4 OZ (WOUND CARE) ×3 IMPLANT
SUT ETHILON 3 0 PS 1 (SUTURE) ×3 IMPLANT
SUT VICRYL 4-0 PS2 18IN ABS (SUTURE) ×3 IMPLANT
SYR 10ML LL (SYRINGE) ×3 IMPLANT
SYR 20CC LL (SYRINGE) ×3 IMPLANT
SYR 5ML LL (SYRINGE) ×6 IMPLANT
SYR CONTROL 10ML LL (SYRINGE) ×3 IMPLANT
TOWEL GREEN STERILE (TOWEL DISPOSABLE) ×3 IMPLANT
TOWEL GREEN STERILE FF (TOWEL DISPOSABLE) ×3 IMPLANT
WATER STERILE IRR 1000ML POUR (IV SOLUTION) ×3 IMPLANT

## 2017-01-29 NOTE — Progress Notes (Signed)
ANTICOAGULATION CONSULT NOTE - Follow Up Consult  Pharmacy Consult for Coumadin Indication: atrial fibrillation  Allergies  Allergen Reactions  . Clonidine Derivatives Palpitations and Other (See Comments)    Very sedated  . Codeine Nausea Only  . Nickel Rash  . Sulfa Antibiotics Other (See Comments)    Reaction unknown   Patient Measurements: Height: 4\' 11"  (149.9 cm) Weight: 117 lb (53.1 kg) IBW/kg (Calculated) : 43.2  Assessment:  83 yr old female on Coumadin 3mg  daily exc for 1.5mg  on MWF PTA for Afib/CVA. On Amio 200 BID as pta. Warfarin was held for HD cath placement but now resuming today. CBC is stable and no bleeding noted. INR remains therapeutic despite held doses.   Goal of Therapy:  INR 2-3 Monitor platelets by anticoagulation protocol: Yes   Plan:  Warfarin 4mg  PO x 1 tonight - giving slightly boosted dose d/t multiple days of held doses Daily INR  Salome Arnt, PharmD, BCPS 01/29/2017 12:43 PM

## 2017-01-29 NOTE — Anesthesia Postprocedure Evaluation (Signed)
Anesthesia Post Note  Patient: Valerie Terrell  Procedure(s) Performed: Procedure(s) (LRB): INSERTION OF DIALYSIS CATHETER- RIGHT INTERNAL JUGULAR (Right)     Patient location during evaluation: PACU Anesthesia Type: General Level of consciousness: awake and alert Pain management: pain level controlled Vital Signs Assessment: post-procedure vital signs reviewed and stable Respiratory status: spontaneous breathing, nonlabored ventilation and respiratory function stable Cardiovascular status: blood pressure returned to baseline and stable Postop Assessment: no apparent nausea or vomiting Anesthetic complications: no    Last Vitals:  Vitals:   01/29/17 1045 01/29/17 1100  BP: (!) 139/51 (!) 134/57  Pulse: (!) 59 60  Resp: 11 10  Temp:    SpO2: 97% 97%    Last Pain:  Vitals:   01/29/17 1100  TempSrc:   PainSc: 0-No pain                 Isaak Delmundo A.

## 2017-01-29 NOTE — Op Note (Signed)
    OPERATIVE REPORT  DATE OF SURGERY: 01/29/2017  PATIENT: Valerie Terrell, 83 y.o. female MRN: [id number]  DOB: 11/03/1932  PRE-OPERATIVE DIAGNOSIS: End-stage renal disease  POST-OPERATIVE DIAGNOSIS:  Same  PROCEDURE: Right IJ hemodialysis catheter with SonoSite visualization  SURGEON:  Curt Jews, M.D.  PHYSICIAN ASSISTANT: Nurse  ANESTHESIA:  Gen.  EBL: Minimal ml  Total I/O In: 100 [I.V.:100] Out: 122 [Urine:120; Blood:2]  BLOOD ADMINISTERED: None  DRAINS: None  SPECIMEN: None  COUNTS CORRECT:  YES  PLAN OF CARE: PACU with chest x-ray pending   PATIENT DISPOSITION:  PACU - hemodynamically stable  PROCEDURE DETAILS: The patient was taken to the operative placed supine position where the area of the right and left neck and chest were prepped and draped in usual sterile fashion. The patient had a recent left pacemaker placement. The right internal jugular vein was widely patent by SonoSite. The patient was placed in Trendelenburg position and 18-gauge needle was used to access the internal jugular vein and the guidewire was passed and the left lobe of the right atrium and this was confirmed with fluoroscopy. A dilator and peel-away sheath was passed and over the guidewire. The dilator and peel-away sheath were removed. A 23 cm hemodialysis catheter was passed through the peel-away sheath which was removed. The tips were placed the level of the distal right atrium. The catheter was brought through subcutaneous tunnel through a separate stab incision. The 2 lm ports were attached in both lumens flushed and aspirated easily and were locked with 1000 unit per cc heparin. The catheter was secured to the skin with a 3-0 nylon stitch and the entry site was closed with a 4 septic or Vicryl stitch. The patient was transferred to the recovery room where chest x-ray is pending   [name], M.D., [facility] 01/29/2017 10:00 AM

## 2017-01-29 NOTE — Progress Notes (Signed)
  [facility] Progress Note   Assessment/ Plan:   1. Acute on chronic systolic CHF exacerbation: refractory vol overload, not much movement on weights and fluid status despite increasing doses of Lasix.  Note that Cr not a great marker of actual eGFR due to advanced age and lower muscle mass.  Starting HD as in #2   2. Acute on chronic CKD--> ESRD: due to solitary functioning kidney and cardiorenal syndrome.  Lengthy discussion with both pt and dtr- will proceed with dialysis.  Vein mapping ordered, VVS consulted, greatly appreciate assistance- s/p TDC.  To discuss permanent access.   HD #1 01/29/17 3. Anemia: Hgb 8.3, to start aranesp with dialysis 4. 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 Nutrition: albumin 2.9, prostat 6. Hypertension: on hydral/ imdur 7.  Afib with tachy/brady s/p PPM: on amiodarone , Coumadin on hold for procedures 8.  Dispo: CLIP in process.    Subjective:    S/p [facility] today with VVS.  HD #1 today.     Objective:   BP (!) 162/54 (BP Location: Right Arm)   Pulse 60   Temp 97.7 F (36.5 C) (Oral)   Resp 16   Ht '4\' 11"'$  (1.499 m)   Wt 53.1 kg (117 lb)   SpO2 95%   BMI 23.63 kg/m   Physical Exam: GEN sitting in bed, SOB HEENT on O2. NECK + JVD PULM bilateral crackles, largely unchanged CV irregular ABD somewhat distended EXT 1-2+ LE edema NEURO AAO x 3  Labs: [employer]  Recent Labs Lab 01/23/17 0245 01/24/17 0523 01/25/17 0339 01/26/17 0536 01/27/17 0454 01/28/17 0524 01/29/17 0541  NA 131* 133* 136 135 136 136 137  K 4.2 3.4* 4.1 3.6 3.1* 3.4* 3.6  CL 91* 93* 96* 95* 95* 94* 93*  CO2 30 28 32 32 32 35* 35*  GLUCOSE 112* 95 106* 92 99 90 89  BUN 61* 58* 58* 58* 56* 52* 48*  CREATININE 2.43* 2.44* 2.31* 2.28* 2.21* 1.90* 1.89*  CALCIUM 8.0* 8.3* 8.7* 9.1 8.9 8.8* 8.7*   CBC  Recent Labs Lab 01/23/17 0728 01/24/17 0523 01/25/17 0339 01/29/17 0541  WBC 9.6 8.9 8.3 7.6  HGB 8.2* 8.5* 8.3* 8.8*  HCT 26.4* 27.6* 26.9* 29.7*  MCV 94.0 95.5  96.4 97.4  PLT 258 236 241 216    '@IMGRELPRIORS'$ @ Medications:    . acetaminophen  1,000 mg Oral QHS  . amiodarone  200 mg Oral Daily  . amLODipine  5 mg Oral Daily  . calcium-vitamin D  1 tablet Oral BID  . carvedilol  25 mg Oral BID WC  . escitalopram  5 mg Oral QHS  . folic acid  1 mg Oral Daily  . hydrALAZINE  100 mg Oral TID  . isosorbide mononitrate  60 mg Oral Daily  . magic mouthwash  5 mL Oral TID  . mirtazapine  7.5 mg Oral QHS  . multivitamin with minerals  1 tablet Oral Once per day on Mon Wed Fri  . pravastatin  80 mg Oral q1800  . sodium chloride flush  3 mL Intravenous Q12H  . vitamin C  500 mg Oral Daily     Madelon Lips, MD [place] pgr [phone] 01/29/2017, 12:38 PM

## 2017-01-29 NOTE — Progress Notes (Signed)
Patient stable 7p-7a. Ready for HD cath tunneling and pre-procedure checklist completed.

## 2017-01-29 NOTE — Anesthesia Preprocedure Evaluation (Addendum)
Anesthesia Evaluation  Patient identified by MRN, date of birth, ID band Patient awake    Reviewed: Allergy & Precautions, NPO status , Patient's Chart, lab work & pertinent test results, reviewed documented beta blocker date and time   Airway Mallampati: II  TM Distance: >3 FB Neck ROM: Full    Dental  (+) Caps   Pulmonary neg pulmonary ROS,    Pulmonary exam normal breath sounds clear to auscultation       Cardiovascular hypertension, Pt. on medications and Pt. 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  Neuromuscular disease CVA, Residual Symptoms negative psych ROS   GI/Hepatic Neg liver ROS,   Endo/Other  Hyperlipidemia Osteoporosis  Renal/GU ESRFRenal diseaseHx/o renal artery stenosis  negative genitourinary   Musculoskeletal  (+) Arthritis , Osteoarthritis,  Lumbar radiculopathy Hx/o Compression Fx T7 and T8 Chronic back pain   Abdominal   Peds  Hematology  (+) anemia , Coumadin - last dose 2 days ago INR 2.04 PT 22   Anesthesia Other Findings   Reproductive/Obstetrics                          Anesthesia Physical Anesthesia Plan  ASA: III  Anesthesia Plan: General   Post-op Pain Management:    Induction: Intravenous  PONV Risk Score and Plan: 4 or greater and Ondansetron, Propofol infusion, Treatment may vary due to age or medical condition, Metaclopromide and Diphenhydramine  Airway Management Planned: LMA  Additional Equipment:   Intra-op Plan:   Post-operative Plan: Extubation in OR  Informed Consent: I have reviewed the patients History and Physical, chart, labs and discussed the procedure including the risks, benefits and alternatives for the proposed  anesthesia with the patient or authorized representative who has indicated his/her understanding and acceptance.   Dental advisory given  Plan Discussed with: CRNA, Anesthesiologist and Surgeon  Anesthesia Plan Comments:         Anesthesia Quick Evaluation

## 2017-01-29 NOTE — H&P (View-Only) (Signed)
[name], female   DOB: Feb 03, 1933, 83 y.o.   MRN: [id number] INR 2.4 today.  Planning HD cath tomorrow if INR normalized.  Coumadin on hold

## 2017-01-29 NOTE — Progress Notes (Signed)
[name], female   DOB: 1932-12-22, 83 y.o.   MRN: [id number]   PROGRESS NOTE   Valerie Terrell  NWG:[id number] DOB: 08-Jul-1932 DOA: 01/10/2017  PCP: Tammi Sou, MD  Brief Narrative:  Pt is 83 yo female with known CKD stage III, solitary functional right kidney, admitted after an episodes of fall and was found to have CHF and rising Cr.    Subjective:  Patient in bed, appears comfortable, denies any headache, no fever, no chest pain or pressure, no shortness of breath , no abdominal pain. No focal weakness.  Assessment & Plan:   Acute on chronic systolic chf/ Acute on chronic hypoxic respiratory failure (home o2 3-4liters at baseline): - With nonischemic cardiomyopathy causing acute on chronic systolic heart failure with EF 35% in July, cardiomyopathy likely caused by rate related cardiomyopathy from A. fib. She has had multiple hospital admissions and has been difficult to diurese despite high doses of diuretics now and in the past, seen by cardiology and nephrology, plan is to start hemodialysis on 01/29/2017. Vascular surgery has placed right IJ HD catheter today. Plan discussed with daughter and nephrology. Continue diuresis and fluid removal through HD, continue Coreg, Imdur and hydralazine combination.   Drowsiness/encephalopathy? -  Drowsiness likely multifactorial including worsening chf with poor brain perfusion, hypoxia, hyponatremia, worsening of bun/cr, delirium from FTT, with supportive care and diuresis encephalopathy has resolved she is back to baseline.   Hyponatremia: from fluid overload, resolved.  Hypokalemia; replaced and monitor.  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  Coumadin per pharmacy, Cardiology following.  Acute  kidney injury on Chronic kidney disease stage IV:  -solitary kidney (History of a nonfunctioning left kidney with mild right renal artery stenosis demonstrated by duplex ultrasound October 2017.) For the following and urine output remains low despite high-dose IV Lasix, now nephrology has decided to start hemodialysis.  Anemia -  Normocytic with anemia panel suggesting combination of iron deficiency anemia and anemia of chronic disease, no sign of bleed, last fobt in 11/2016 was negative, FOBT negative. Placed on folate supplementation, no need for transfusion. Nephrology on board.   Essential hypertension: currently on  amlodipine, Lasix, Coreg, hydralazine, Imdur  Undisplaced right ninth rib fracture without complicating feature, multiple old right rib fractures with healing: Pain management, incentive spirometer and supportive care. PT OT evaluation.  L. Forehead abrasion: s/p suture on admission.  RN report started to bleed at around 7pm, advise manual pressure for a longer duration due to patient on coumadin, then pressure dressing. No active bleed today.   Generalized anxiety/depression:  Started Lexapro 5 mg daily at bedtime and mirtazapine 7.5 mg at bedtimeby psychiatrist. Encourage oral intake.  Non-severe (moderate) malnutrition in context of chronic illness Body mass index is 23.83 kg/m. nutriton consulted, on nutrition supplement  FTT: this is her 5 hospitalizations this year, daughter report 40 pounds of weight loss this year. PT/OT, SNF placement ( significant care giver exhaustion as well)   Lab Results  Component Value Date   INR 2.04 01/29/2017   INR 2.40 01/28/2017   INR 2.20 01/27/2017    DVT prophylaxis: Coumadin Code Status: Full code Family Communication: Daughter bedside on 01/28/2017, 01/29/17 Disposition Plan: not ready for discharge, need cardiology and nephrology clearance, SNF placement at discharge .   Consultants:  Cardiology, signed off on 9/7  Nephrology  Psychiatry, signed off on 9/2  VVS  Procedures:  Right IJ hemodialysis catheter placement by vascular surgery on 01/29/2017.    Anti-infectives    Start     Dose/Rate Route Frequency Ordered Stop   01/29/17 0800  ceFAZolin (ANCEF) IVPB 1 g/50 mL premix    Comments:  Send with pt to OR   1 g 100 mL/hr over 30 Minutes Intravenous On call 01/28/17 0737 01/29/17 0942   01/17/17 1330  vancomycin (VANCOCIN) 500 mg in sodium chloride 0.9 % 100 mL IVPB  Status:  Discontinued     500 mg 100 mL/hr over 60 Minutes Intravenous Every 48 hours 01/16/17 1133 01/16/17 1405   01/16/17 1500  levofloxacin (LEVAQUIN) tablet 500 mg  Status:  Discontinued     500 mg Oral Every 48 hours 01/16/17 1405 01/20/17 1517   01/15/17 1200  vancomycin (VANCOCIN) 500 mg in sodium chloride 0.9 % 100 mL IVPB  Status:  Discontinued     500 mg 100 mL/hr over 60 Minutes Intravenous Every 48 hours 01/13/17 1258 01/16/17 1133   01/13/17 1400  ceFEPIme (MAXIPIME) 1 g in dextrose 5 % 50 mL IVPB  Status:  Discontinued     1 g 100 mL/hr over 30 Minutes Intravenous Every 8 hours 01/13/17 1203 01/13/17 1247   01/13/17 1330  vancomycin (VANCOCIN) IVPB 1000 mg/200 mL premix     1,000 mg 200 mL/hr over 60 Minutes Intravenous  Once 01/13/17 1247 01/13/17 1430   01/13/17 1300  ceFEPIme (MAXIPIME) 1 g in dextrose 5 % 50 mL IVPB  Status:  Discontinued     1 g 100 mL/hr over 30 Minutes Intravenous Every 24 hours 01/13/17 1247 01/16/17 1405        Objective: Vitals:   01/29/17 1030 01/29/17 1045 01/29/17 1100 01/29/17 1132  BP: (!) 150/55 (!) 139/51 (!) 134/57 (!) 162/54  Pulse: 60 (!) 59 60 60  Resp: 12 11 10 16   Temp:    97.7 F (36.5 C)  TempSrc:    Oral  SpO2: 97% 97% 97% 95%  Weight:      Height:        Intake/Output Summary (Last 24 hours) at 01/29/17 1144 Last data filed at 01/29/17 1000  Gross per 24 hour  Intake              778 ml  Output             1342 ml  Net             -564 ml    Filed Weights   01/28/17 0629 01/29/17 0509 01/29/17 0826  Weight: 53.2 kg (117 lb 4.8 oz) 53.3 kg (117 lb 6.4 oz) 53.1 kg (117 lb)   Physical Exam   Awake Alert, Oriented X 3, No new F.N deficits, Normal affect [place].AT,PERRAL Supple Neck,No JVD, No cervical lymphadenopathy appriciated.  Symmetrical Chest wall movement, Good air movement bilaterally, Positive rales, right IJ dialysis catheter in place RRR,No Gallops,Rubs or new Murmurs, No Parasternal Heave +ve B.Sounds, Abd Soft, No tenderness, No organomegaly appriciated, No rebound - guarding or rigidity. No Cyanosis, Clubbing, positive trace lower extremity edema, No new Rash or bruise    Data Reviewed: I have personally reviewed following labs and imaging studies  CBC:  Recent Labs Lab 01/23/17 0728 01/24/17 0523 01/25/17 0339 01/29/17 0541  WBC 9.6 8.9 8.3 7.6  HGB 8.2* 8.5* 8.3* 8.8*  HCT 26.4* 27.6* 26.9* 29.7*  MCV 94.0 95.5 96.4 97.4  PLT 258 236 241 801   Basic Metabolic Panel:  Recent Labs Lab 01/25/17 0339 01/26/17 0536 01/27/17 0454 01/28/17 0524 01/29/17 0541  NA 136 135 136 136 137  K 4.1 3.6 3.1* 3.4* 3.6  CL 96* 95* 95* 94* 93*  CO2 32 32 32 35* 35*  GLUCOSE 106* 92 99 90 89  BUN 58* 58* 56* 52* 48*  CREATININE 2.31* 2.28* 2.21* 1.90* 1.89*  CALCIUM 8.7* 9.1 8.9 8.8* 8.7*   Liver Function Tests:  Recent Labs Lab 01/27/17 0454  ALBUMIN 2.9*   No results for input(s): AMMONIA in the last 168 hours. Coagulation Profile:  Recent Labs Lab 01/25/17 0339 01/26/17 0536 01/27/17 0454 01/28/17 0524 01/29/17 0541  INR 2.01 1.94 2.20 2.40 2.04   Urine analysis:    Component Value Date/Time   COLORURINE YELLOW 01/20/2017 1737   APPEARANCEUR CLEAR 01/20/2017 1737   LABSPEC 1.012 01/20/2017 1737   PHURINE 5.0 01/20/2017 1737   GLUCOSEU NEGATIVE 01/20/2017 1737   HGBUR NEGATIVE 01/20/2017 1737   BILIRUBINUR NEGATIVE 01/20/2017 1737   BILIRUBINUR negative 09/24/2016 1453   KETONESUR  NEGATIVE 01/20/2017 1737   PROTEINUR 30 (A) 01/20/2017 1737   UROBILINOGEN 0.2 09/24/2016 1453   NITRITE NEGATIVE 01/20/2017 1737   LEUKOCYTESUR NEGATIVE 01/20/2017 1737   Recent Results (from the past 240 hour(s))  Surgical PCR screen     Status: None   Collection Time: 01/29/17 12:43 AM  Result Value Ref Range Status   MRSA, PCR NEGATIVE NEGATIVE Final   Staphylococcus aureus NEGATIVE NEGATIVE Final    Comment: (NOTE) The Xpert SA Assay (FDA approved for NASAL specimens in patients 21 years of age and older), is one component of a comprehensive surveillance program. It is not intended to diagnose infection nor to guide or monitor treatment.     Radiology Studies:  Dg Chest Port 1 View Result Date: 01/20/2017  CHF with mild interstitial edema. Left lower lobe atelectasis and probable left pleural effusion. The ICD is in stable position. Thoracic aortic atherosclerosis.   Scheduled Meds: . acetaminophen  1,000 mg Oral QHS  . amiodarone  200 mg Oral Daily  . amLODipine  5 mg Oral Daily  . calcium-vitamin D  1 tablet Oral BID  . carvedilol  25 mg Oral BID WC  . escitalopram  5 mg Oral QHS  . folic acid  1 mg Oral Daily  . hydrALAZINE  100 mg Oral TID  . isosorbide mononitrate  60 mg Oral Daily  . magic mouthwash  5 mL Oral TID  . mirtazapine  7.5 mg Oral QHS  . multivitamin with minerals  1 tablet Oral Once per day on Mon Wed Fri  . pravastatin  80 mg Oral q1800  . sodium chloride flush  3 mL Intravenous Q12H  . vitamin C  500 mg Oral Daily   Continuous Infusions: . sodium chloride    . sodium chloride    . sodium chloride    . furosemide Stopped (01/28/17 2353)    LOS: 18 days   Time spent: 25 minutes    Signature  Lala Lund M.D on 01/29/2017 at 11:44 AM  Between 7am to 7pm - Pager - [phone] ( page via [email or web address], text pages only, please mention full 10 digit call back number).  After 7pm go to [email or web address] - password [facility]

## 2017-01-29 NOTE — Interval H&P Note (Signed)
History and Physical Interval Note:  01/29/2017 8:55 AM  Valerie Terrell  has presented today for surgery, with the diagnosis of End stage renal disease  The various methods of treatment have been discussed with the patient and family. After consideration of risks, benefits and other options for treatment, the patient has consented to  Procedure(s): INSERTION OF DIALYSIS CATHETER (N/A) as a surgical intervention .  The patient's history has been reviewed, patient examined, no change in status, stable for surgery.  I have reviewed the patient's chart and labs.  Questions were answered to the patient's satisfaction.     Curt Jews

## 2017-01-29 NOTE — Procedures (Signed)
Patient seen and examined on Hemodialysis. QB 200 mL/ min, UF goal 500 mL.  HD #1.  Tolerating well.  Treatment adjusted as needed.  Madelon Lips MD [facility] pgr [phone] 3:36 PM

## 2017-01-29 NOTE — Anesthesia Procedure Notes (Signed)
Procedure Name: LMA Insertion Date/Time: 01/29/2017 9:27 AM Performed by: Julieta Bellini Pre-anesthesia Checklist: Patient identified, Emergency Drugs available, Suction available and Patient being monitored Patient Re-evaluated:Patient Re-evaluated prior to induction Oxygen Delivery Method: Circle system utilized Preoxygenation: Pre-oxygenation with 100% oxygen Induction Type: IV induction Ventilation: Mask ventilation without difficulty LMA: LMA inserted LMA Size: 4.0 Number of attempts: 1 Placement Confirmation: positive ETCO2 and breath sounds checked- equal and bilateral Tube secured with: Tape Dental Injury: Teeth and Oropharynx as per pre-operative assessment

## 2017-01-29 NOTE — Transfer of Care (Signed)
Immediate Anesthesia Transfer of Care Note  Patient: Valerie Terrell  Procedure(s) Performed: Procedure(s): INSERTION OF DIALYSIS CATHETER- RIGHT INTERNAL JUGULAR (Right)  Patient Location: PACU  Anesthesia Type:General  Level of Consciousness: drowsy and patient cooperative  Airway & Oxygen Therapy: Patient Spontanous Breathing and Patient connected to face mask oxygen  Post-op Assessment: Report given to RN, Post -op Vital signs reviewed and stable and Patient moving all extremities X 4  Post vital signs: Reviewed and stable  Last Vitals:  Vitals:   01/28/17 2112 01/29/17 0509  BP: (!) 143/46 (!) 162/59  Pulse: 63 66  Resp: 18 18  Temp: 37 C 36.8 C  SpO2: 99% 98%    Last Pain:  Vitals:   01/29/17 0746  TempSrc:   PainSc: 7       Patients Stated Pain Goal: 0 (62/37/62 8315)  Complications: No apparent anesthesia complications

## 2017-01-30 ENCOUNTER — Encounter (HOSPITAL_COMMUNITY): Payer: Self-pay | Admitting: Vascular Surgery

## 2017-01-30 LAB — CBC
HCT: 29.4 % — ABNORMAL LOW (ref 36.0–46.0)
HEMOGLOBIN: 8.6 g/dL — AB (ref 12.0–15.0)
MCH: 29.2 pg (ref 26.0–34.0)
MCHC: 29.3 g/dL — AB (ref 30.0–36.0)
MCV: 99.7 fL (ref 78.0–100.0)
Platelets: 191 10*3/uL (ref 150–400)
RBC: 2.95 MIL/uL — ABNORMAL LOW (ref 3.87–5.11)
RDW: 16.9 % — ABNORMAL HIGH (ref 11.5–15.5)
WBC: 8.4 10*3/uL (ref 4.0–10.5)

## 2017-01-30 LAB — BASIC METABOLIC PANEL
Anion gap: 8 (ref 5–15)
BUN: 31 mg/dL — AB (ref 6–20)
CHLORIDE: 101 mmol/L (ref 101–111)
CO2: 29 mmol/L (ref 22–32)
CREATININE: 1.64 mg/dL — AB (ref 0.44–1.00)
Calcium: 8.7 mg/dL — ABNORMAL LOW (ref 8.9–10.3)
GFR calc Af Amer: 33 mL/min — ABNORMAL LOW (ref >60)
GFR calc non Af Amer: 28 mL/min — ABNORMAL LOW (ref >60)
GLUCOSE: 108 mg/dL — AB (ref 65–99)
POTASSIUM: 4.2 mmol/L (ref 3.5–5.1)
SODIUM: 138 mmol/L (ref 135–145)

## 2017-01-30 LAB — HEPATITIS B CORE ANTIBODY, IGM: HEP B C IGM: NEGATIVE

## 2017-01-30 LAB — PROTIME-INR
INR: 1.79
PROTHROMBIN TIME: 20.6 s — AB (ref 11.4–15.2)

## 2017-01-30 LAB — HEPATITIS B SURFACE ANTIBODY,QUALITATIVE: Hep B S Ab: NONREACTIVE

## 2017-01-30 MED ORDER — OXYCODONE-ACETAMINOPHEN 5-325 MG PO TABS
ORAL_TABLET | ORAL | Status: AC
  Filled 2017-01-30: qty 2

## 2017-01-30 MED ORDER — DARBEPOETIN ALFA 100 MCG/0.5ML IJ SOSY
PREFILLED_SYRINGE | INTRAMUSCULAR | Status: AC
  Filled 2017-01-30: qty 0.5

## 2017-01-30 MED ORDER — ALBUTEROL SULFATE (2.5 MG/3ML) 0.083% IN NEBU
INHALATION_SOLUTION | RESPIRATORY_TRACT | Status: AC
  Administered 2017-01-30: 2.5 mg
  Filled 2017-01-30: qty 3

## 2017-01-30 MED ORDER — WARFARIN SODIUM 2 MG PO TABS
4.0000 mg | ORAL_TABLET | Freq: Once | ORAL | Status: AC
  Administered 2017-01-30: 4 mg via ORAL
  Filled 2017-01-30: qty 2

## 2017-01-30 NOTE — Progress Notes (Signed)
Pt completed  3hrs HD treatment taking off 755ml.after that c/o SOB, Pt BP 160/12mmhg. P66, O2 Sat 100% in 4L nasal cannula. Dr. Jonnie Finner notified and order received as follow: add HD treatment 2hrs more, fluid removal 2l. Report given to Sharen Heck RN.Pt is stable.

## 2017-01-30 NOTE — Progress Notes (Signed)
  [name] Progress Note   Assessment/ Plan:   1. Acute on chronic systolic CHF exacerbation: refractory vol overload, not much movement on weights and fluid status despite increasing doses of Lasix.  Note that Cr not a great marker of actual eGFR due to advanced age and lower muscle mass.  Starting HD. 2. Acute on chronic CKD--> ESRD: due to solitary functioning kidney and cardiorenal syndrome.  Lengthy discussion with both pt and dtr- will proceed with dialysis.  Vein mapping ordered, VVS consulted, greatly appreciate assistance- s/p TDC.  To discuss permanent access OP.   HD #1 01/29/17.  CLIP in process 3. Anemia: Hgb 8.3, to start aranesp with dialysis 100 mcg 9/15.  Iron sats fine (44%) 4. 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 Nutrition: albumin 2.9, prostat 6. Hypertension: on hydral/ imdur 7.  Afib with tachy/brady s/p PPM: on amiodarone , Coumadin resumed 8.  Dispo: CLIP in process.    Subjective:    Tolerated HD well yesterday and feeling better!  HD #2 today.     Objective:   BP (!) 174/54 (BP Location: Right Arm)   Pulse 66   Temp 97.7 F (36.5 C) (Oral)   Resp 16   Ht '4\' 11"'$  (1.499 m)   Wt 53.4 kg (117 lb 11.2 oz)   SpO2 99%   BMI 23.77 kg/m   Physical Exam: GEN sitting in bed, SOB, but improved HEENT on O2. NECK + JVD PULM bilateral crackles, minimally improved CV irregular ABD somewhat distended EXT 1-2+ LE edema NEURO AAO x 3  Labs: [employer]  Recent Labs Lab 01/24/17 0523 01/25/17 0339 01/26/17 0536 01/27/17 0454 01/28/17 0524 01/29/17 0541 01/30/17 0610  NA 133* 136 135 136 136 137 138  K 3.4* 4.1 3.6 3.1* 3.4* 3.6 4.2  CL 93* 96* 95* 95* 94* 93* 101  CO2 28 32 32 32 35* 35* 29  GLUCOSE 95 106* 92 99 90 89 108*  BUN 58* 58* 58* 56* 52* 48* 31*  CREATININE 2.44* 2.31* 2.28* 2.21* 1.90* 1.89* 1.64*  CALCIUM 8.3* 8.7* 9.1 8.9 8.8* 8.7* 8.7*   CBC  Recent Labs Lab 01/24/17 0523 01/25/17 0339 01/29/17 0541 01/30/17 0610  WBC 8.9 8.3  7.6 8.4  HGB 8.5* 8.3* 8.8* 8.6*  HCT 27.6* 26.9* 29.7* 29.4*  MCV 95.5 96.4 97.4 99.7  PLT 236 241 216 191    '@IMGRELPRIORS'$ @ Medications:    . acetaminophen  1,000 mg Oral QHS  . amiodarone  200 mg Oral Daily  . amLODipine  5 mg Oral Daily  . calcium-vitamin D  1 tablet Oral BID  . carvedilol  25 mg Oral BID WC  . darbepoetin (ARANESP) injection - DIALYSIS  100 mcg Intravenous Q Sat-HD  . folic acid  1 mg Oral Daily  . hydrALAZINE  100 mg Oral TID  . isosorbide mononitrate  60 mg Oral Daily  . multivitamin with minerals  1 tablet Oral Once per day on Mon Wed Fri  . pravastatin  80 mg Oral q1800  . sodium chloride flush  3 mL Intravenous Q12H  . vitamin C  500 mg Oral Daily  . Warfarin - Pharmacist Dosing Inpatient   Does not apply [place], MD [facility] pgr [phone] 01/30/2017, 9:34 AM

## 2017-01-30 NOTE — Procedures (Signed)
Patient seen and examined on Hemodialysis. QB 300 mL/ min, UF goal 750 mL.  HD #2.  Tolerating well.  Hopefully this will make her breathing even easier  Treatment adjusted as needed.  Madelon Lips MD [facility] pgr [phone] 1:30 PM

## 2017-01-30 NOTE — Progress Notes (Signed)
[name], female   DOB: [birth year], 83 y.o.   MRN: [id number]   PROGRESS NOTE   Valerie Terrell  KZS:[id number] DOB: [birth year] DOA: 01/10/2017  PCP: Tammi Sou, MD  Brief Narrative:  Pt is 83 yo female with known CKD stage III, solitary functional right kidney, admitted after an episodes of fall and was found to have CHF and rising Cr.   Assessment & Plan:   Acute on chronic systolic chf/ Acute on chronic hypoxic respiratory failure (home o2 3-4liters at baseline): - Chest x-ray on admission showed interstitial pulmonary edema, stable bilateral pleural effusion and possible left lower lobe atelectasis / left lower lobe pneumonia -she received oral Levaquin x 8days, last dose on 9/5. No fever. Continue to monitor off abx -Nonischemic cardiomyopathy (no regional wall motion on echo, negative stress test),EF of 35-40% by echo in July. -chf Presumed from afib -she appear to have increased oxygen requirement and become more drowsy this am on 9/5, suspect worsening of chf, repeat  cxr on 9/5 confirmed chf, - cardiology and nephrology consulted.  -nephrology started HD on 9/14, as patient does not seem to improve on high dose of lasix  Drowsiness/encephalopathy? -Patient was found to be very drowsy on 9/5 am, she falls back to sleep during conversation on 9/5 - No narcotic or sedative the night before or 9/5 am -cxr with worsening of chf, stat ABG co2 upper normal limit with normal PH.  -Drowsiness likely multifactorial including worsening chf with poor brain perfusion, hypoxia, hyponatremia, worsening of bun/cr, delirium from FTT, prolonged hospitalization could also contribute -Nephrology consulted,she is started on dialysis - encephalopathy seems has resolved  Hyponatremia: from fluids overload?  Sodium nadir to 127 on 9/6 sodium improved and normalized since 9/10  Hypokalemia; replace prn, keep k>4.  Paroxysmal atrial fibrillation with h/o tachy-brady  s/p pacemaker placement in 11/2016:  -H/o subarachnoid hemorrhage after syncope while on xarelto in 7322, h/o embolic cva while on anticoagulation -Currently paced rhythm, Continue coreg/amiodarone and Coumadin.  -Coumadin per pharmacy , inr 1.79 today -Cardiology consulted and signed off   Acute kidney injury on Chronic kidney disease stage IV:  -solitary kidney (History of a nonfunctioning left kidney with mild right renal artery stenosis demonstrated by duplex ultrasound October 2017.) - nephrology input appreciated, HD #1 01/29/17.  CLIP in process -- Avoid nephrotoxins.  snf placement once CLIP finalized.   Anemia , normocytic, no sign of bleed, last fobt in 11/2016 was negative, FOBT negative.  Low folate, start folate supplement,  iron/b12 adequate. tibili wnl. Calcium low Likely component of anemia of chronic disease Consider transfusion if hgb less than 7.  Nephrology started epo once a week during HD.   Essential hypertension: currently on  amlodipine, Lasix, Coreg, hydralazine, Imdur. Hopefully dialysis will help blood pressure control.  Undisplaced right ninth rib fracture without complicating feature, multiple old right rib fractures with healing: Pain management, incentive spirometer and supportive care. PT OT evaluation.   Forehead abrasion: s/p suture on admission.  RN report started to bleed at around 7pm, advise manual pressure for a longer duration due to patient on coumadin, then pressure dressing. No active bleed for several days including today  Generalized anxiety/depression:  Started Lexapro 5 mg daily at bedtime and mirtazapine 7.5 mg at bedtimeby psychiatrist. Encourage oral intake. Patient report she can not tolerate this meds due to it make her confused, she request these meds to be discontinued, infact, she has been refusing it for several days meds discontinued  on 9/15    Non-severe (moderate) malnutrition in context of chronic illness Body  mass index is 23.83 kg/m. nutriton consulted, on nutrition supplement  FTT: this is her 5 hospitalizations this year, daughter report 40pounds of weight loss this year. PT/OT,  SNF placement ( significant care giver exhaustion as well)     DVT prophylaxis: Coumadin Code Status: Full code Family Communication: Patient Disposition Plan: clip in process, need nephrology clearance for discharge,   SNF placement at discharge .    Consultants:  Cardiology, signed off on 9/7   Nephrology  Psychiatry, signed off on 9/2  VVS  Procedures:   Right IJ hemodialysis catheter placement by vascular surgery on 01/29/2017 Dialysis started on 9/14  Antimicrobials: Levaquin from admission to 9/5.   Subjective: Seems stronger, she denies pain, less edema, she report has not been taking remeron or lexapro, she does not want it  Objective: Vitals:   01/29/17 1654 01/29/17 1810 01/29/17 2014 01/30/17 0533  BP: (!) 167/48 (!) 163/45 (!) 140/45 (!) 174/54  Pulse: 66  61 66  Resp: 18  16 16   Temp: 98.5 F (36.9 C)  98.6 F (37 C) 97.7 F (36.5 C)  TempSrc: Oral  Oral Oral  SpO2: 100%  100% 99%  Weight:    53.4 kg (117 lb 11.2 oz)  Height:        Intake/Output Summary (Last 24 hours) at 01/30/17 0836 Last data filed at 01/30/17 0824  Gross per 24 hour  Intake              640 ml  Output              502 ml  Net              138 ml   Filed Weights   01/29/17 1410 01/29/17 1617 01/30/17 0533  Weight: 54.1 kg (119 lb 4.3 oz) 53 kg (116 lb 13.5 oz) 53.4 kg (117 lb 11.2 oz)   Physical Exam  Constitutional: . Frail, chronically ill appearing, fully alert, oriented x3,  CVS: paced rhythm Pulmonary: poor respiratory effort, less bibasilar crackles , over all very diminished, no wheezing, no rhonchi Abdominal: Soft. BS +,  no distension Musculoskeletal: Normal range of motion. No tenderness. lower extremity Pitting edema seems improving Lymphadenopathy: No lymphadenopathy  noted, cervical, inguinal. Neuro: Alert. No focal deficits  Skin: Skin is warm and dry.  Psychiatric: Normal mood and affect.     Data Reviewed: I have personally reviewed following labs and imaging studies  CBC:  Recent Labs Lab 01/24/17 0523 01/25/17 0339 01/29/17 0541 01/30/17 0610  WBC 8.9 8.3 7.6 8.4  HGB 8.5* 8.3* 8.8* 8.6*  HCT 27.6* 26.9* 29.7* 29.4*  MCV 95.5 96.4 97.4 99.7  PLT 236 241 216 419   Basic Metabolic Panel:  Recent Labs Lab 01/26/17 0536 01/27/17 0454 01/28/17 0524 01/29/17 0541 01/30/17 0610  NA 135 136 136 137 138  K 3.6 3.1* 3.4* 3.6 4.2  CL 95* 95* 94* 93* 101  CO2 32 32 35* 35* 29  GLUCOSE 92 99 90 89 108*  BUN 58* 56* 52* 48* 31*  CREATININE 2.28* 2.21* 1.90* 1.89* 1.64*  CALCIUM 9.1 8.9 8.8* 8.7* 8.7*   Liver Function Tests:  Recent Labs Lab 01/27/17 0454 01/29/17 1437  ALT  --  13*  ALBUMIN 2.9*  --    No results for input(s): AMMONIA in the last 168 hours. Coagulation Profile:  Recent Labs Lab 01/26/17 0536 01/27/17 0454 01/28/17  9417 01/29/17 0541 01/30/17 0610  INR 1.94 2.20 2.40 2.04 1.79   Urine analysis:    Component Value Date/Time   COLORURINE YELLOW 01/20/2017 1737   APPEARANCEUR CLEAR 01/20/2017 1737   LABSPEC 1.012 01/20/2017 1737   PHURINE 5.0 01/20/2017 1737   GLUCOSEU NEGATIVE 01/20/2017 1737   HGBUR NEGATIVE 01/20/2017 1737   BILIRUBINUR NEGATIVE 01/20/2017 1737   BILIRUBINUR negative 09/24/2016 1453   KETONESUR NEGATIVE 01/20/2017 1737   PROTEINUR 30 (A) 01/20/2017 1737   UROBILINOGEN 0.2 09/24/2016 1453   NITRITE NEGATIVE 01/20/2017 1737   LEUKOCYTESUR NEGATIVE 01/20/2017 1737   Recent Results (from the past 240 hour(s))  Surgical PCR screen     Status: None   Collection Time: 01/29/17 12:43 AM  Result Value Ref Range Status   MRSA, PCR NEGATIVE NEGATIVE Final   Staphylococcus aureus NEGATIVE NEGATIVE Final    Comment: (NOTE) The Xpert SA Assay (FDA approved for NASAL specimens in  patients 81 years of age and older), is one component of a comprehensive surveillance program. It is not intended to diagnose infection nor to guide or monitor treatment.     Radiology Studies: Dg Chest Port 1 View Result Date: 01/20/2017 CHF with mild interstitial edema. Left lower lobe atelectasis and probable left pleural effusion. The ICD is in stable position. Thoracic aortic atherosclerosis.   Scheduled Meds: . acetaminophen  1,000 mg Oral QHS  . amiodarone  200 mg Oral Daily  . amLODipine  5 mg Oral Daily  . calcium-vitamin D  1 tablet Oral BID  . carvedilol  25 mg Oral BID WC  . darbepoetin (ARANESP) injection - DIALYSIS  100 mcg Intravenous Q Sat-HD  . folic acid  1 mg Oral Daily  . hydrALAZINE  100 mg Oral TID  . isosorbide mononitrate  60 mg Oral Daily  . multivitamin with minerals  1 tablet Oral Once per day on Mon Wed Fri  . pravastatin  80 mg Oral q1800  . sodium chloride flush  3 mL Intravenous Q12H  . vitamin C  500 mg Oral Daily  . Warfarin - Pharmacist Dosing Inpatient   Does not apply q1800   Continuous Infusions: . sodium chloride    . sodium chloride    . sodium chloride      LOS: 19 days   Time spent: 25 minutes   I have personally reviewed and interpreted on  01/30/2017  daily labs, tele strips, imagings as discussed above under date review session and assessment and plans.  I reviewed all nursing notes, pharmacy notes, consultant notes,  vitals, pertinent old records Case discussed with nephrology in person.  I have discussed plan of care as described above with  patient 01/30/2017    Florencia Reasons, MD PhD [facility] Pager [phone]  If 7PM-7AM, please contact night-coverage [email or web address] Password [facility] 01/30/2017, 8:36 AM

## 2017-01-30 NOTE — Progress Notes (Signed)
ANTICOAGULATION CONSULT NOTE - Follow Up Consult  Pharmacy Consult for Coumadin Indication: atrial fibrillation  Allergies  Allergen Reactions  . Clonidine Derivatives Palpitations and Other (See Comments)    Very sedated  . Codeine Nausea Only  . Nickel Rash  . Sulfa Antibiotics Other (See Comments)    Reaction unknown   Patient Measurements: Height: 4\' 11"  (149.9 cm) Weight: 117 lb 11.2 oz (53.4 kg) IBW/kg (Calculated) : 43.2  Assessment:  83 yr old female on Coumadin 3mg  daily exc for 1.5mg  on MWF PTA for Afib/CVA. On Amio 200 BID as pta. Warfarin was held for HD cath placement but resumed on 9/14. CBC is stable and no bleeding noted. INR decreased to 1.79, likely due to previously held doses.   Goal of Therapy:  INR 2-3 Monitor platelets by anticoagulation protocol: Yes   Plan:  Warfarin 4mg  PO x 1 tonight - continue giving slightly boosted dose d/t multiple days of held doses Daily INR/CBC Will defer to primary team if want any bridge given subtherapeutic INR  Doylene Canard, PharmD Clinical Pharmacist  Phone: [phone] 01/30/2017 12:04 PM

## 2017-01-31 ENCOUNTER — Encounter (HOSPITAL_COMMUNITY): Payer: Self-pay | Admitting: *Deleted

## 2017-01-31 LAB — PROTIME-INR
INR: 2.04
Prothrombin Time: 22.8 seconds — ABNORMAL HIGH (ref 11.4–15.2)

## 2017-01-31 LAB — PHOSPHORUS: Phosphorus: 1.2 mg/dL — ABNORMAL LOW (ref 2.5–4.6)

## 2017-01-31 MED ORDER — WARFARIN SODIUM 3 MG PO TABS
3.0000 mg | ORAL_TABLET | Freq: Once | ORAL | Status: AC
  Administered 2017-01-31: 3 mg via ORAL
  Filled 2017-01-31: qty 1

## 2017-01-31 NOTE — Procedures (Signed)
Patient seen and examined on Hemodialysis. QB 400 mL/ min, UF goal 2.5L  HD #3.  Aggressive UF.  Probing for EDW.  Treatment adjusted as needed.  Madelon Lips MD [facility] pgr [phone] 2:12 PM

## 2017-01-31 NOTE — Progress Notes (Signed)
Pt is gone to dialysis

## 2017-01-31 NOTE — Progress Notes (Signed)
[name], female   DOB: 11/30/1932, 83 y.o.   MRN: [id number]   PROGRESS NOTE   Valerie Terrell  GEX:[id number] DOB: [birth year] DOA: 01/10/2017  PCP: Tammi Sou, MD  Brief Narrative:  Pt is 83 yo female with known CKD stage III, solitary functional right kidney, admitted after an episodes of fall and was found to have CHF and rising Cr.   Assessment & Plan:   Acute on chronic systolic chf/ Acute on chronic hypoxic respiratory failure (home o2 3-4liters at baseline): - Chest x-ray on admission showed interstitial pulmonary edema, stable bilateral pleural effusion and possible left lower lobe atelectasis / left lower lobe pneumonia -she received oral Levaquin x 8days, last dose on 9/5. No fever. Continue to monitor off abx -Nonischemic cardiomyopathy (no regional wall motion on echo, negative stress test),EF of 35-40% by echo in July. -chf Presumed from afib -she appear to have increased oxygen requirement and become more drowsy this am on 9/5, suspect worsening of chf, repeat  cxr on 9/5 confirmed chf, - cardiology and nephrology consulted.  -nephrology started HD on 9/14, as patient does not seem to improve on high dose of lasix  Drowsiness/encephalopathy? -Patient was found to be very drowsy on 9/5 am, she falls back to sleep during conversation on 9/5 - No narcotic or sedative the night before or 9/5 am -cxr with worsening of chf, stat ABG co2 upper normal limit with normal PH.  -Drowsiness likely multifactorial including worsening chf with poor brain perfusion, hypoxia, hyponatremia, worsening of bun/cr, delirium from FTT, prolonged hospitalization could also contribute -Nephrology consulted,patient is started on dialysis due to refractory to diuretics - encephalopathy seems has resolved  Hyponatremia: from fluids overload?  Sodium nadir to 127 on 9/6 sodium improved and normalized since 9/10  Hypokalemia; replace prn, keep k>4.  Paroxysmal atrial  fibrillation with h/o tachy-brady s/p pacemaker placement in 11/2016:  -H/o subarachnoid hemorrhage after syncope while on xarelto in 0102, h/o embolic cva while on anticoagulation -Currently paced rhythm, Continue coreg/amiodarone and Coumadin.  -Coumadin per pharmacy , inr 1.79 today -Cardiology consulted and signed off   Acute kidney injury on Chronic kidney disease stage IV:  -solitary kidney (History of a nonfunctioning left kidney with mild right renal artery stenosis demonstrated by duplex ultrasound October 2017.) - nephrology input appreciated, HD #1 01/29/17.  CLIP in process -- Avoid nephrotoxins.  snf placement once CLIP finalized.   Anemia , normocytic, no sign of bleed, last fobt in 11/2016 was negative, FOBT negative.  Low folate, start folate supplement,  iron/b12 adequate. tibili wnl. Calcium low Likely component of anemia of chronic disease Consider transfusion if hgb less than 7.  Nephrology started epo once a week during HD.   Essential hypertension: currently on  amlodipine, Lasix, Coreg, hydralazine, Imdur. Hopefully dialysis will help blood pressure control.  Undisplaced right ninth rib fracture without complicating feature, multiple old right rib fractures with healing: Pain management, incentive spirometer and supportive care. PT OT evaluation.   Forehead abrasion: s/p suture on admission.  RN report started to bleed at around 7pm, advise manual pressure for a longer duration due to patient on coumadin, then pressure dressing. No active bleed for several days including today  Generalized anxiety/depression:  Started Lexapro 5 mg daily at bedtime and mirtazapine 7.5 mg at bedtimeby psychiatrist. Encourage oral intake. Patient report she can not tolerate this meds due to it make her confused, she request these meds to be discontinued, infact, she has been refusing it  for several days meds discontinued on 9/15    Non-severe (moderate) malnutrition in  context of chronic illness Body mass index is 23.83 kg/m. nutriton consulted, on nutrition supplement  FTT: this is her 5 hospitalizations this year, daughter report 40pounds of weight loss this year. PT/OT,  SNF placement ( significant care giver exhaustion as well)     DVT prophylaxis: Coumadin Code Status: Full code Family Communication: Patient Disposition Plan: clip in process, need nephrology clearance for discharge,   SNF placement at discharge .    Consultants:  Cardiology, signed off on 9/7   Nephrology  Psychiatry, signed off on 9/2  VVS  Procedures:   Right IJ hemodialysis catheter placement by vascular surgery on 01/29/2017 Dialysis started on 9/14  Antimicrobials: Levaquin from admission to 9/5.   Subjective: Over all improving, still has signs of volume overload, getting additional HD today   Objective: Vitals:   01/31/17 1236 01/31/17 1241 01/31/17 1300 01/31/17 1330  BP: (!) 153/58 (!) 147/55 (!) 150/64 (!) 155/63  Pulse: 64 64 66 60  Resp: 16 16 16 15   Temp: 97.8 F (36.6 C)     TempSrc: Oral     SpO2: 100%     Weight: 53 kg (116 lb 13.5 oz)     Height:        Intake/Output Summary (Last 24 hours) at 01/31/17 1359 Last data filed at 01/31/17 1100  Gross per 24 hour  Intake              410 ml  Output             3000 ml  Net            -2590 ml   Filed Weights   01/30/17 1926 01/31/17 0700 01/31/17 1236  Weight: 50 kg (110 lb 3.7 oz) 53 kg (116 lb 13.5 oz) 53 kg (116 lb 13.5 oz)   Physical Exam  Constitutional: . Frail, chronically ill appearing, fully alert, oriented x3,  CVS: paced rhythm Pulmonary: poor respiratory effort, less bibasilar crackles , over all very diminished, no wheezing, no rhonchi Abdominal: Soft. BS +,  no distension Musculoskeletal: Normal range of motion. No tenderness. lower extremity Pitting edema seems improving Lymphadenopathy: No lymphadenopathy noted, cervical, inguinal. Neuro: Alert. No  focal deficits  Skin: Skin is warm and dry.  Psychiatric: Normal mood and affect.     Data Reviewed: I have personally reviewed following labs and imaging studies  CBC:  Recent Labs Lab 01/25/17 0339 01/29/17 0541 01/30/17 0610  WBC 8.3 7.6 8.4  HGB 8.3* 8.8* 8.6*  HCT 26.9* 29.7* 29.4*  MCV 96.4 97.4 99.7  PLT 241 216 035   Basic Metabolic Panel:  Recent Labs Lab 01/26/17 0536 01/27/17 0454 01/28/17 0524 01/29/17 0541 01/30/17 0610  NA 135 136 136 137 138  K 3.6 3.1* 3.4* 3.6 4.2  CL 95* 95* 94* 93* 101  CO2 32 32 35* 35* 29  GLUCOSE 92 99 90 89 108*  BUN 58* 56* 52* 48* 31*  CREATININE 2.28* 2.21* 1.90* 1.89* 1.64*  CALCIUM 9.1 8.9 8.8* 8.7* 8.7*   Liver Function Tests:  Recent Labs Lab 01/27/17 0454 01/29/17 1437  ALT  --  13*  ALBUMIN 2.9*  --    No results for input(s): AMMONIA in the last 168 hours. Coagulation Profile:  Recent Labs Lab 01/27/17 0454 01/28/17 0524 01/29/17 0541 01/30/17 0610 01/31/17 0432  INR 2.20 2.40 2.04 1.79 2.04   Urine analysis:  Component Value Date/Time   COLORURINE YELLOW 01/20/2017 1737   APPEARANCEUR CLEAR 01/20/2017 1737   LABSPEC 1.012 01/20/2017 1737   PHURINE 5.0 01/20/2017 1737   GLUCOSEU NEGATIVE 01/20/2017 1737   HGBUR NEGATIVE 01/20/2017 1737   BILIRUBINUR NEGATIVE 01/20/2017 1737   BILIRUBINUR negative 09/24/2016 1453   KETONESUR NEGATIVE 01/20/2017 1737   PROTEINUR 30 (A) 01/20/2017 1737   UROBILINOGEN 0.2 09/24/2016 1453   NITRITE NEGATIVE 01/20/2017 1737   LEUKOCYTESUR NEGATIVE 01/20/2017 1737   Recent Results (from the past 240 hour(s))  Surgical PCR screen     Status: None   Collection Time: 01/29/17 12:43 AM  Result Value Ref Range Status   MRSA, PCR NEGATIVE NEGATIVE Final   Staphylococcus aureus NEGATIVE NEGATIVE Final    Comment: (NOTE) The Xpert SA Assay (FDA approved for NASAL specimens in patients 44 years of age and older), is one component of a comprehensive surveillance  program. It is not intended to diagnose infection nor to guide or monitor treatment.     Radiology Studies: Dg Chest Port 1 View Result Date: 01/20/2017 CHF with mild interstitial edema. Left lower lobe atelectasis and probable left pleural effusion. The ICD is in stable position. Thoracic aortic atherosclerosis.   Scheduled Meds: . acetaminophen  1,000 mg Oral QHS  . amiodarone  200 mg Oral Daily  . amLODipine  5 mg Oral Daily  . calcium-vitamin D  1 tablet Oral BID  . carvedilol  25 mg Oral BID WC  . darbepoetin (ARANESP) injection - DIALYSIS  100 mcg Intravenous Q Sat-HD  . folic acid  1 mg Oral Daily  . hydrALAZINE  100 mg Oral TID  . isosorbide mononitrate  60 mg Oral Daily  . multivitamin with minerals  1 tablet Oral Once per day on Mon Wed Fri  . pravastatin  80 mg Oral q1800  . sodium chloride flush  3 mL Intravenous Q12H  . vitamin C  500 mg Oral Daily  . warfarin  3 mg Oral ONCE-1800  . Warfarin - Pharmacist Dosing Inpatient   Does not apply q1800   Continuous Infusions: . sodium chloride    . sodium chloride    . sodium chloride      LOS: 20 days   Time spent: 15 minutes   I have personally reviewed and interpreted on  01/31/2017  daily labs, tele strips, imagings as discussed above under date review session and assessment and plans.  I reviewed all nursing notes, pharmacy notes, consultant notes,  vitals, pertinent old records Case discussed with nephrology in person.  I have discussed plan of care as described above with nephrology and  patient 01/31/2017    Florencia Reasons, MD PhD [facility] Pager [phone]  If 7PM-7AM, please contact night-coverage [email or web address] Password [email or web address] 01/31/2017, 1:59 PM

## 2017-01-31 NOTE — Progress Notes (Signed)
  [facility] Progress Note   Assessment/ Plan:   1. Acute on chronic systolic CHF exacerbation: refractory vol overload, not much movement on weights and fluid status despite increasing doses of Lasix.  Note that Cr not a great marker of actual eGFR due to advanced age and lower muscle mass.  HD started.  Volume status improving. 2. Acute on chronic CKD--> ESRD: due to solitary functioning kidney and cardiorenal syndrome.  Lengthy discussion with both pt and dtr- will proceed with dialysis.  Vein mapping ordered, VVS consulted, greatly appreciate assistance- s/p TDC.  To discuss permanent access OP.   HD #1 01/29/17.  CLIP in process.  HD today with relatively aggressive UF goal as we establish EDW. 3. Anemia: Hgb 8.3, to start aranesp with dialysis 100 mcg 9/15.  Iron sats fine (44%) 4. 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 Nutrition: albumin 2.9, prostat 6. Hypertension: on hydral/ imdur 7.  Afib with tachy/brady s/p PPM: on amiodarone , Coumadin resumed 8.  Dispo: CLIP in process.    Subjective:    HD #2 yesterday, had what sounds like an episode of flash pulmonary edema at end of treatment so treatment was extended by 2 hrs.     Objective:   BP (!) 100/52 (BP Location: Left Arm)   Pulse 61   Temp 97.8 F (36.6 C) (Oral)   Resp 16   Ht '4\' 11"'$  (1.499 m)   Wt 53 kg (116 lb 13.5 oz)   SpO2 99%   BMI 23.60 kg/m   Physical Exam: GEN sitting in bed, SOB, but improved HEENT on O2. NECK + JVD PULM bilateral crackles, improved CV irregular ABD distention improved EXT 1-2+ LE edema NEURO AAO x 3  Labs: [employer]  Recent Labs Lab 01/25/17 0339 01/26/17 0536 01/27/17 0454 01/28/17 0524 01/29/17 0541 01/30/17 0610  NA 136 135 136 136 137 138  K 4.1 3.6 3.1* 3.4* 3.6 4.2  CL 96* 95* 95* 94* 93* 101  CO2 32 32 32 35* 35* 29  GLUCOSE 106* 92 99 90 89 108*  BUN 58* 58* 56* 52* 48* 31*  CREATININE 2.31* 2.28* 2.21* 1.90* 1.89* 1.64*  CALCIUM 8.7* 9.1 8.9 8.8* 8.7* 8.7*    CBC  Recent Labs Lab 01/25/17 0339 01/29/17 0541 01/30/17 0610  WBC 8.3 7.6 8.4  HGB 8.3* 8.8* 8.6*  HCT 26.9* 29.7* 29.4*  MCV 96.4 97.4 99.7  PLT 241 216 191    '@IMGRELPRIORS'$ @ Medications:    . acetaminophen  1,000 mg Oral QHS  . amiodarone  200 mg Oral Daily  . amLODipine  5 mg Oral Daily  . calcium-vitamin D  1 tablet Oral BID  . carvedilol  25 mg Oral BID WC  . darbepoetin (ARANESP) injection - DIALYSIS  100 mcg Intravenous Q Sat-HD  . folic acid  1 mg Oral Daily  . hydrALAZINE  100 mg Oral TID  . isosorbide mononitrate  60 mg Oral Daily  . multivitamin with minerals  1 tablet Oral Once per day on Mon Wed Fri  . pravastatin  80 mg Oral q1800  . sodium chloride flush  3 mL Intravenous Q12H  . vitamin C  500 mg Oral Daily  . warfarin  3 mg Oral ONCE-1800  . Warfarin - Pharmacist Dosing Inpatient   Does not apply [place], MD [facility] pgr [phone] 01/31/2017, 11:39 AM

## 2017-01-31 NOTE — Progress Notes (Signed)
ANTICOAGULATION CONSULT NOTE - Follow Up Consult  Pharmacy Consult for Coumadin Indication: atrial fibrillation  Allergies  Allergen Reactions  . Clonidine Derivatives Palpitations and Other (See Comments)    Very sedated  . Codeine Nausea Only  . Nickel Rash  . Sulfa Antibiotics Other (See Comments)    Reaction unknown   Patient Measurements: Height: 4\' 11"  (149.9 cm) Weight: 116 lb 13.5 oz (53 kg) IBW/kg (Calculated) : 43.2  Assessment:  83 yr old female on Coumadin 3mg  daily exc for 1.5mg  on MWF PTA for Afib/CVA. On Amio 200 BID as pta. Warfarin was held for HD cath placement but resumed on 9/14. CBC is stable and no bleeding noted. INR increased from 1.79 to 2.04 today after receiving two doses of 4 mg to counter effects of held doses for two days.    Goal of Therapy:  INR 2-3 Monitor platelets by anticoagulation protocol: Yes   Plan:  Warfarin 3 mg PO x 1 tonight (following home regimen) Daily INR/CBC  Doylene Canard, PharmD Clinical Pharmacist  Phone: [phone] 01/31/2017 11:17 AM

## 2017-02-01 LAB — RENAL FUNCTION PANEL
ANION GAP: 8 (ref 5–15)
Albumin: 3 g/dL — ABNORMAL LOW (ref 3.5–5.0)
BUN: 8 mg/dL (ref 6–20)
CALCIUM: 8.7 mg/dL — AB (ref 8.9–10.3)
CO2: 28 mmol/L (ref 22–32)
CREATININE: 1.51 mg/dL — AB (ref 0.44–1.00)
Chloride: 98 mmol/L — ABNORMAL LOW (ref 101–111)
GFR calc Af Amer: 36 mL/min — ABNORMAL LOW (ref >60)
GFR calc non Af Amer: 31 mL/min — ABNORMAL LOW (ref >60)
GLUCOSE: 91 mg/dL (ref 65–99)
Phosphorus: 2.4 mg/dL — ABNORMAL LOW (ref 2.5–4.6)
Potassium: 3.2 mmol/L — ABNORMAL LOW (ref 3.5–5.1)
SODIUM: 134 mmol/L — AB (ref 135–145)

## 2017-02-01 LAB — CBC
HCT: 29.3 % — ABNORMAL LOW (ref 36.0–46.0)
Hemoglobin: 8.7 g/dL — ABNORMAL LOW (ref 12.0–15.0)
MCH: 29 pg (ref 26.0–34.0)
MCHC: 29.7 g/dL — AB (ref 30.0–36.0)
MCV: 97.7 fL (ref 78.0–100.0)
PLATELETS: 165 10*3/uL (ref 150–400)
RBC: 3 MIL/uL — ABNORMAL LOW (ref 3.87–5.11)
RDW: 16.5 % — AB (ref 11.5–15.5)
WBC: 9.2 10*3/uL (ref 4.0–10.5)

## 2017-02-01 LAB — PROTIME-INR
INR: 2.25
PROTHROMBIN TIME: 24.7 s — AB (ref 11.4–15.2)

## 2017-02-01 MED ORDER — WARFARIN SODIUM 3 MG PO TABS
1.5000 mg | ORAL_TABLET | Freq: Once | ORAL | Status: AC
  Administered 2017-02-01: 1.5 mg via ORAL
  Filled 2017-02-01 (×2): qty 0.5

## 2017-02-01 NOTE — Progress Notes (Signed)
PT Cancellation Note  Patient Details Name: Valerie Terrell MRN: [id number] DOB: 16-Nov-1932   Cancelled Treatment:    Reason Eval/Treat Not Completed: Patient at procedure or test/unavailable. Pt currently in HD. Will check back as schedule allows to continue with PT POC.   Thelma Comp 02/01/2017, 9:57 AM   Rolinda Roan, PT, DPT Acute Rehabilitation Services Pager: [phone]

## 2017-02-01 NOTE — Progress Notes (Signed)
[name], female   DOB: [birth year], 83 y.o.   MRN: [id number]   PROGRESS NOTE   Valerie Terrell  IHK:[id number] DOB: 1932-09-27 DOA: 01/10/2017  PCP: Tammi Sou, MD  Brief Narrative:  Pt is 83 yo female, ambulated with the help of a cane/walker prior to admission, daughter lives with her, PMH of chronic combined diastolic and systolic CHF, NICM, stage IV chronic kidney disease, solitary functioning right kidney, CAD, PAF on warfarin, home oxygen 3-4 L/m, HLD, HTN, CVA admitted to the hospital on 01/10/17 when she presented with bleeding from a head wound, frequent falls, acute on chronic hypoxic respiratory failure, nondisplaced right ninth rib fracture, acute on chronic systolic and diastolic CHF.   Assessment & Plan:   Acute on chronic systolic chf/ Acute on chronic hypoxic respiratory failure (home o2 3-4liters at baseline): - Patient did not respond to high-dose Lasix. Cardiology and nephrology were consulted. Nephrology initiated hemodialysis on 9/14 and volume management across dialysis. - She completed 8 days of Levaquin on 9/5 for presumed pneumonia. -Nonischemic cardiomyopathy (no regional wall motion on echo, negative stress test),EF of 35-40% by echo in July. - Clinically improved. Currently saturating in the high 90s on 4 L/m. Taper oxygen as tolerated for saturations >90%.  Drowsiness/encephalopathy? - Patient was found to be drowsy on the morning of 9/5. This was felt to be multifactorial due to worsening CHF, hypoxia, hyponatremia, uremia. - She initiated dialysis on 9/14. - Resolved.  Hyponatremia: from fluids overload?  Sodium nadir to 127 on 9/6 sodium improved and normalized since 9/10  Hypokalemia; As per nephrology.  Paroxysmal atrial fibrillation with h/o tachy-brady s/p pacemaker placement in 11/2016:  -H/o subarachnoid hemorrhage after syncope while on xarelto in 7564, h/o embolic cva while on anticoagulation -Currently paced rhythm,  Continue coreg/amiodarone and Coumadin.  -Coumadin per pharmacy , inr 2.25 -Cardiology consulted and signed off   Acute kidney injury on Chronic kidney disease stage IV:  -solitary kidney (History of a nonfunctioning left kidney with mild right renal artery stenosis demonstrated by duplex ultrasound October 2017.) - nephrology input appreciated, HD #1 01/29/17.  CLIP in process -- Avoid nephrotoxins.  - Patient and family declined SNF placement at discharge.  Anemia , normocytic, no sign of bleed, last fobt in 11/2016 was negative, FOBT negative.  Low folate, started folate supplement,  iron/b12 adequate. tibili wnl. Calcium low Likely component of anemia of chronic disease Consider transfusion if hgb less than 7.  Nephrology started epo once a week during HD.  Hemoglobin stable in the mid 8 g per DL range.  Essential hypertension: currently on  amlodipine, Coreg, hydralazine, Imdur. Hopefully dialysis will help blood pressure control. Blood pressures still fluctuating and mildly uncontrolled.  Undisplaced right ninth rib fracture without complicating feature, multiple old right rib fractures with healing: Pain management, incentive spirometer and supportive care. PT OT evaluation. No further pain reported.  Forehead abrasion: s/p suture on admission.  No active bleeding for several days.  Generalized anxiety/depression:  Started Lexapro 5 mg daily at bedtime and mirtazapine 7.5 mg at bedtimeby psychiatrist. Encourage oral intake. Patient report she can not tolerate this meds due to it make her confused, she request these meds to be discontinued, infact, she has been refusing it for several days and hence meds discontinued on 9/15  Non-severe (moderate) malnutrition in context of chronic illness Body mass index is 23.83 kg/m. nutriton consulted, on nutrition supplement  FTT: this is her 5 hospitalizations this year, daughter report 40pounds of  weight loss this year. PT/OT,    SNF placement was recommended but patient and daughter declined and plan to go home with home health services.     DVT prophylaxis: Therapeutic on Coumadin per pharmacy  Code Status: Full code Family Communication: none at bedside  Disposition Plan: clip in process, need nephrology clearance for discharge, home with home health services at discharge .    Consultants:  Cardiology, signed off on 9/7   Nephrology  Psychiatry, signed off on 9/2  VVS  Procedures:   Right IJ hemodialysis catheter placement by vascular surgery on 01/29/2017 Dialysis started on 9/14  Antimicrobials: Levaquin from admission to 9/5.   Subjective: Seen this morning at hemodialysis. Reports chronic low back pain which she has had since prior to admission. No chest pain or dyspnea reported. Denied any other complaints.   Objective: Vitals:   02/01/17 1030 02/01/17 1036 02/01/17 1040 02/01/17 1233  BP: (!) 142/66 (!) 181/75 (!) 175/64 106/84  Pulse: 64 64  (!) 54  Resp: 20 (!) 23  20  Temp:  98 F (36.7 C)  98.3 F (36.8 C)  TempSrc:  Oral  Oral  SpO2: 100% 100%  97%  Weight:  48 kg (105 lb 13.1 oz)    Height:        Intake/Output Summary (Last 24 hours) at 02/01/17 1534 Last data filed at 02/01/17 1427  Gross per 24 hour  Intake              120 ml  Output             4601 ml  Net            -4481 ml   Filed Weights   02/01/17 0554 02/01/17 0703 02/01/17 1036  Weight: 48.5 kg (107 lb) 50 kg (110 lb 3.7 oz) 48 kg (105 lb 13.1 oz)   Physical Exam  Constitutional: . Frail, chronically ill appearing, fully alert, oriented x3, Lying comfortably supine in bed undergoing HD this morning.  CVS:S1 and S2 heard, RRR. No JVD or murmurs. No pedal edema. Telemetry: Sinus rhythm and intermittent atrial paced rhythm.  Pulmonary: Slightly diminished breath sounds at the bases and occasional basal crackles but rest of lung fields clear to auscultation. No increased work of breathing.   Abdominal: nondistended, soft and nontender. Normal bowel sounds heard.  Musculoskeletal: Symmetrically moves all extremities. No pedal edema. Old/healing area of ecchymosis over left mid lateral calf.  Neuro:  alert and oriented 3: No focal neurological deficits.  Skin: Skin is warm and dry.  Psychiatric: Normal mood and affect.     Data Reviewed: I have personally reviewed following labs and imaging studies  CBC:  Recent Labs Lab 01/29/17 0541 01/30/17 0610 02/01/17 0733  WBC 7.6 8.4 9.2  HGB 8.8* 8.6* 8.7*  HCT 29.7* 29.4* 29.3*  MCV 97.4 99.7 97.7  PLT 216 191 884   Basic Metabolic Panel:  Recent Labs Lab 01/27/17 0454 01/28/17 0524 01/29/17 0541 01/30/17 0610 01/31/17 1832 02/01/17 0733  NA 136 136 137 138  --  134*  K 3.1* 3.4* 3.6 4.2  --  3.2*  CL 95* 94* 93* 101  --  98*  CO2 32 35* 35* 29  --  28  GLUCOSE 99 90 89 108*  --  91  BUN 56* 52* 48* 31*  --  8  CREATININE 2.21* 1.90* 1.89* 1.64*  --  1.51*  CALCIUM 8.9 8.8* 8.7* 8.7*  --  8.7*  PHOS  --   --   --   --  1.2* 2.4*   Liver Function Tests:  Recent Labs Lab 01/27/17 0454 01/29/17 1437 02/01/17 0733  ALT  --  13*  --   ALBUMIN 2.9*  --  3.0*   No results for input(s): AMMONIA in the last 168 hours. Coagulation Profile:  Recent Labs Lab 01/28/17 0524 01/29/17 0541 01/30/17 0610 01/31/17 0432 02/01/17 0435  INR 2.40 2.04 1.79 2.04 2.25   Urine analysis:    Component Value Date/Time   COLORURINE YELLOW 01/20/2017 1737   APPEARANCEUR CLEAR 01/20/2017 1737   LABSPEC 1.012 01/20/2017 1737   PHURINE 5.0 01/20/2017 1737   GLUCOSEU NEGATIVE 01/20/2017 1737   HGBUR NEGATIVE 01/20/2017 1737   BILIRUBINUR NEGATIVE 01/20/2017 1737   BILIRUBINUR negative 09/24/2016 1453   KETONESUR NEGATIVE 01/20/2017 1737   PROTEINUR 30 (A) 01/20/2017 1737   UROBILINOGEN 0.2 09/24/2016 1453   NITRITE NEGATIVE 01/20/2017 1737   LEUKOCYTESUR NEGATIVE 01/20/2017 1737   Recent Results (from the past  240 hour(s))  Surgical PCR screen     Status: None   Collection Time: 01/29/17 12:43 AM  Result Value Ref Range Status   MRSA, PCR NEGATIVE NEGATIVE Final   Staphylococcus aureus NEGATIVE NEGATIVE Final    Comment: (NOTE) The Xpert SA Assay (FDA approved for NASAL specimens in patients 84 years of age and older), is one component of a comprehensive surveillance program. It is not intended to diagnose infection nor to guide or monitor treatment.     Radiology Studies: Dg Chest Port 1 View Result Date: 01/20/2017 CHF with mild interstitial edema. Left lower lobe atelectasis and probable left pleural effusion. The ICD is in stable position. Thoracic aortic atherosclerosis.   Scheduled Meds: . acetaminophen  1,000 mg Oral QHS  . amiodarone  200 mg Oral Daily  . amLODipine  5 mg Oral Daily  . calcium-vitamin D  1 tablet Oral BID  . carvedilol  25 mg Oral BID WC  . darbepoetin (ARANESP) injection - DIALYSIS  100 mcg Intravenous Q Sat-HD  . folic acid  1 mg Oral Daily  . hydrALAZINE  100 mg Oral TID  . isosorbide mononitrate  60 mg Oral Daily  . multivitamin with minerals  1 tablet Oral Once per day on Mon Wed Fri  . pravastatin  80 mg Oral q1800  . sodium chloride flush  3 mL Intravenous Q12H  . vitamin C  500 mg Oral Daily  . warfarin  1.5 mg Oral ONCE-1800  . Warfarin - Pharmacist Dosing Inpatient   Does not apply q1800   Continuous Infusions: . sodium chloride    . sodium chloride    . sodium chloride      LOS: 21 days   Time spent: 15 minutes   Kalah Pflum, MD, FACP, FHM. [facility] Pager [phone]  If 7PM-7AM, please contact night-coverage [email or web address] Password [facility] 02/01/2017, 3:49 PM

## 2017-02-01 NOTE — Progress Notes (Signed)
PT Cancellation Note  Patient Details Name: Valerie Terrell MRN: [id number] DOB: December 25, 1932   Cancelled Treatment:    Reason Eval/Treat Not Completed: Fatigue/lethargy limiting ability to participate. Patient at dialysis this morning, checked back in with patient this afternoon and patient declined therapy due to fatigue.  Kenny Stern SPT Tu Shimmel 02/01/2017, 2:18 PM

## 2017-02-01 NOTE — Progress Notes (Signed)
ANTICOAGULATION CONSULT NOTE - Follow Up Consult  Pharmacy Consult for Warfarin Indication: atrial fibrillation  Patient Measurements: Height: 4\' 11"  (149.9 cm) Weight: 105 lb 13.1 oz (48 kg) IBW/kg (Calculated) : 43.2 Heparin Dosing Weight:   Vital Signs: Temp: 98 F (36.7 C) (09/17 1036) Temp Source: Oral (09/17 1036) BP: 175/64 (09/17 1040) Pulse Rate: 64 (09/17 1036)  Labs:  Recent Labs  01/30/17 0610 01/31/17 0432 02/01/17 0435 02/01/17 0733  HGB 8.6*  --   --  8.7*  HCT 29.4*  --   --  29.3*  PLT 191  --   --  165  LABPROT 20.6* 22.8* 24.7*  --   INR 1.79 2.04 2.25  --   CREATININE 1.64*  --   --  1.51*    Estimated Creatinine Clearance: 19.9 mL/min (A) (by C-G formula based on SCr of 1.51 mg/dL (H)).  Assessment:  Assessment:  83 YOF on Coumadin 3mg  daily exc for 1.5mg  on MWF PTA for Afib/CVA. On Amio 200 BID as pta. Warfarin was held for HD cath placement but resumed on 9/14.   INR today remains therapeutic (INR 2.25 << 2.04, goal of 2-3). CBC low but stable. No bleeding noted at this time. Will attempt to resume home dosing.   Goal of Therapy:  INR 2-3 Monitor platelets by anticoagulation protocol: Yes   Plan:  1. Warfarin 1.5 mg x 1 dose at 1800 today 2. Will continue to monitor for any signs/symptoms of bleeding and will follow up with PT/INR in the a.m.   Thank you for allowing pharmacy to be a part of this patient's care.  Alycia Rossetti, PharmD, BCPS Clinical Pharmacist Pager: [phone] Clinical phone for 02/01/2017 from 7a-3:30p: [phone] If after 3:30p, please call main pharmacy at: [phone] 02/01/2017 11:16 AM

## 2017-02-01 NOTE — Procedures (Signed)
I was present at this dialysis session. I have reviewed the session itself and made appropriate changes.   HD#4.  Tolerated well UF 2L.  4K bath. CLIP in process ,likely to [facility].  Next HD tentative for tomorrow, anticipate [facility] HD schedule as outpatient.    Filed Weights   01/31/17 1600 02/01/17 0554 02/01/17 0703  Weight: 50.5 kg (111 lb 5.3 oz) 48.5 kg (107 lb) 50 kg (110 lb 3.7 oz)     Recent Labs Lab 02/01/17 0733  NA 134*  K 3.2*  CL 98*  CO2 28  GLUCOSE 91  BUN 8  CREATININE 1.51*  CALCIUM 8.7*  PHOS 2.4*     Recent Labs Lab 01/29/17 0541 01/30/17 0610 02/01/17 0733  WBC 7.6 8.4 9.2  HGB 8.8* 8.6* 8.7*  HCT 29.7* 29.4* 29.3*  MCV 97.4 99.7 97.7  PLT 216 191 165    Scheduled Meds: . acetaminophen  1,000 mg Oral QHS  . amiodarone  200 mg Oral Daily  . amLODipine  5 mg Oral Daily  . calcium-vitamin D  1 tablet Oral BID  . carvedilol  25 mg Oral BID WC  . darbepoetin (ARANESP) injection - DIALYSIS  100 mcg Intravenous Q Sat-HD  . folic acid  1 mg Oral Daily  . hydrALAZINE  100 mg Oral TID  . isosorbide mononitrate  60 mg Oral Daily  . multivitamin with minerals  1 tablet Oral Once per day on Mon Wed Fri  . pravastatin  80 mg Oral q1800  . sodium chloride flush  3 mL Intravenous Q12H  . vitamin C  500 mg Oral Daily  . Warfarin - Pharmacist Dosing Inpatient   Does not apply q1800   Continuous Infusions: . sodium chloride    . sodium chloride    . sodium chloride     PRN Meds:.sodium chloride, sodium chloride, sodium chloride, acetaminophen, albuterol, alteplase, bisacodyl, heparin, hydrALAZINE, lidocaine (PF), lidocaine-prilocaine, ondansetron (ZOFRAN) IV, oxyCODONE-acetaminophen, pentafluoroprop-tetrafluoroeth, polyethylene glycol, sodium chloride flush   Pearson Grippe  MD 02/01/2017, 10:34 AM

## 2017-02-01 NOTE — Clinical Social Work Note (Addendum)
CSW continues to follow for discharge needs.  Dayton Scrape, CSW [phone]  1:55 pm CSW met with patient and daughter. They wanted to confirm decision for returning home with home health. A ramp should be completed by tomorrow for her home. RNCM aware.  CSW signing off. Consult again if any other social work needs arise.  Dayton Scrape, [place]

## 2017-02-01 NOTE — Progress Notes (Signed)
CM met with patient and her daughter again about DCP; pt plans to return home with [facility] services provided by [facility], she is refusing SNF placement at this time; private sitter list given to the daughter for additional help in the home; Aneta Mins [phone]

## 2017-02-01 NOTE — Consult Note (Signed)
   [facility] CM Inpatient Consult   02/01/2017  Valerie Terrell [birth year] [id number]   Follow up:  Came by to follow up with patient, patient hospitalized for long length of stay and had been active in the community with [facility] services. Following up regarding ongoing services for post hospital follow up.  Patient has actively started Hemodialysis and is currently being clipped and followed by [facility]. Patient was sound asleep  had dialysis today.  Met with the patient at the bedside.  Patient states, "I am planning to go home and I feel like that will be best for me but I am concerned about getting to dialysis every time. That would be just too much for my daughter.  I need all the help I can get"  Patient states that, "for the most part I am tolerating dialysis alright.  They told me that I will have to watch what I drink and what I eat."  Acknowledge to the patient that hemodialysis is life altering.  She states, "Yes, but I will be monitoring what I eat and drink from here on. The dialysis seems to be working."  Patient consents to ongoing follow up with [facility].  Patient states she will have [facility] for her home health agency. Will continue to follow for needs. For questions, please contact:  Natividad Brood, RN BSN [facility] Liaison  [phone] business mobile phone Toll free office [phone]

## 2017-02-02 DIAGNOSIS — Z992 Dependence on renal dialysis: Secondary | ICD-10-CM

## 2017-02-02 DIAGNOSIS — N186 End stage renal disease: Secondary | ICD-10-CM

## 2017-02-02 LAB — PROTIME-INR
INR: 2.45
PROTHROMBIN TIME: 26.4 s — AB (ref 11.4–15.2)

## 2017-02-02 MED ORDER — WARFARIN SODIUM 3 MG PO TABS
3.0000 mg | ORAL_TABLET | Freq: Once | ORAL | Status: AC
  Administered 2017-02-02: 3 mg via ORAL
  Filled 2017-02-02: qty 1

## 2017-02-02 NOTE — Procedures (Addendum)
Admit: 01/10/2017 LOS: 56  57F New ESRD, A/C sCHF exacerbation  Subjective:  HD yesterday, 2L UF For HD today CLIP in process   09/17 0701 - 09/18 0700 In: 360 [P.O.:360] Out: 2151 [Urine:150; Stool:1]  Filed Weights   02/01/17 0703 02/01/17 1036 02/02/17 0529  Weight: 50 kg (110 lb 3.7 oz) 48 kg (105 lb 13.1 oz) 46.5 kg (102 lb 9.6 oz)    Scheduled Meds: . acetaminophen  1,000 mg Oral QHS  . amiodarone  200 mg Oral Daily  . amLODipine  5 mg Oral Daily  . calcium-vitamin D  1 tablet Oral BID  . carvedilol  25 mg Oral BID WC  . darbepoetin (ARANESP) injection - DIALYSIS  100 mcg Intravenous Q Sat-HD  . folic acid  1 mg Oral Daily  . hydrALAZINE  100 mg Oral TID  . isosorbide mononitrate  60 mg Oral Daily  . multivitamin with minerals  1 tablet Oral Once per day on Mon Wed Fri  . pravastatin  80 mg Oral q1800  . sodium chloride flush  3 mL Intravenous Q12H  . vitamin C  500 mg Oral Daily  . Warfarin - Pharmacist Dosing Inpatient   Does not apply q1800   Continuous Infusions: . sodium chloride    . sodium chloride    . sodium chloride     PRN Meds:.sodium chloride, sodium chloride, sodium chloride, acetaminophen, albuterol, alteplase, bisacodyl, heparin, hydrALAZINE, lidocaine (PF), lidocaine-prilocaine, ondansetron (ZOFRAN) IV, oxyCODONE-acetaminophen, pentafluoroprop-tetrafluoroeth, polyethylene glycol, sodium chloride flush  Current Labs: reviewed    Physical Exam:  Blood pressure (!) 176/49, pulse 62, temperature 98.6 F (37 C), temperature source Oral, resp. rate 18, height 4\' 11"  (1.499 m), weight 46.5 kg (102 lb 9.6 oz), SpO2 100 %. NAD IRIR Diminished b/l No LEE AAO x3   A 1. AoCKD, now ESRD, tol iHD.  Using [facility], VVS deferred AV access to outpatient.   2. A/C sCHF Exacerbation: cont to probe for lower EDW 3. HTN 4. CKD-BMD, stable 5. AFib with tachy/brady s/p PPM, on warfarin 6. FTT: SNF deferred by patient  P 1. HD today, cont on THS schedule; 4K,  Goal UF 2.5L, 3h. 2. F/u on CLIP   Pearson Grippe MD 02/02/2017, 11:30 AM   Recent Labs Lab 01/29/17 0541 01/30/17 0610 01/31/17 1832 02/01/17 0733  NA 137 138  --  134*  K 3.6 4.2  --  3.2*  CL 93* 101  --  98*  CO2 35* 29  --  28  GLUCOSE 89 108*  --  91  BUN 48* 31*  --  8  CREATININE 1.89* 1.64*  --  1.51*  CALCIUM 8.7* 8.7*  --  8.7*  PHOS  --   --  1.2* 2.4*    Recent Labs Lab 01/29/17 0541 01/30/17 0610 02/01/17 0733  WBC 7.6 8.4 9.2  HGB 8.8* 8.6* 8.7*  HCT 29.7* 29.4* 29.3*  MCV 97.4 99.7 97.7  PLT 216 191 [facility] 9:37 AM

## 2017-02-02 NOTE — Progress Notes (Signed)
ANTICOAGULATION CONSULT NOTE - Follow Up Consult  Pharmacy Consult for Warfarin Indication: atrial fibrillation  Patient Measurements: Height: 4\' 11"  (149.9 cm) Weight: 102 lb 9.6 oz (46.5 kg) (scale c) IBW/kg (Calculated) : 43.2  Vital Signs: Temp: 98.6 F (37 C) (09/18 0529) Temp Source: Oral (09/18 0529) BP: 176/49 (09/18 0529) Pulse Rate: 62 (09/18 0529)  Recent Labs  01/31/17 0432 02/01/17 0435 02/01/17 0733 02/02/17 0522  HGB  --   --  8.7*  --   HCT  --   --  29.3*  --   PLT  --   --  165  --   LABPROT 22.8* 24.7*  --  26.4*  INR 2.04 2.25  --  2.45  CREATININE  --   --  1.51*  --     Estimated Creatinine Clearance: 19.9 mL/min (A) (by C-G formula based on SCr of 1.51 mg/dL (H)).  Assessment:  Assessment:  83 YOF on warfarin 3mg  daily exc for 1.5mg  on MWF PTA for Afib/CVA. On Amio 200 BID as pta. Warfarin was held for HD cath placement but resumed on 9/14.   INR today remains therapeutic (INR 2.25>2.45, goal of 2-3). CBC low but stable. No bleeding noted at this time. PO intake now 85-100%. Will attempt to resume home dosing.  PTA dose: warfarin 3mg  daily exc for 1.5mg  on MWF  Goal of Therapy:  INR 2-3 Monitor platelets by anticoagulation protocol: Yes   Plan:  Give warfarin 3 mg po x 1 Monitor daily INR, CBC, clinical course, s/sx of bleed, PO intake, DDI    Thank you for allowing [place] to participate in this patients care.  Jens Som, PharmD Clinical phone for 02/02/2017 from 7a-3:30p: [phone] If after 3:30p, please call main pharmacy at: [phone] 02/02/2017 11:09 AM

## 2017-02-02 NOTE — Progress Notes (Signed)
Nutrition Follow-up  DOCUMENTATION CODES:   Non-severe (moderate) malnutrition in context of chronic illness  INTERVENTION:   -Recommend liberalizing diet to REGULAR with fluid restriction  -Pt may benefit from switching to renal MVI  -Continue Carn[employer] Instant Breakfast drink  -Continues snacks between meals  -Initiated diet education (see below)   NUTRITION DIAGNOSIS:   Malnutrition (Moderate) related to chronic illness as evidenced by percent weight loss, mild depletion of body fat, moderate depletions of muscle mass, mild fluid accumulation.  Continues but being addressed via snacks, supplement, MVI, recommending liberalizing diet  GOAL:   Patient will meet greater than or equal to 90% of their needs  Progressing  MONITOR:   PO intake, Supplement acceptance, Labs, Weight trends  REASON FOR ASSESSMENT:   Consult Assessment of nutrition requirement/status (wt loss)  ASSESSMENT:   83 yo female admitted with acute on chronic respiratory failure, fall with rib fracture, acute on chronic CHF. Pt with hx of CHF, CKD IV, CAD, afib  9/14 HD initiated 9/18 4th HD today  Pt appears very weak. Flat.  Pt under the impression that she cannot have ANY fluid, potassium, phosphorus. Explained to pt that this is not the case. Discussed current fluid restriction. Also explained that current pt diet order is not restricted in potassium or phosphorus and that in fact, pt's current lab vales are below normal. Encouraged adequate po intake  Pt continues to eat well at breakfast meals but does not like lunch/dinner meals. Reports most of the meals are too heavy. Tries to order salad plates. Pt ate grapes, cottage cheese and cereal for breakfast this AM. Recorded po intake 54%   Weight down since initiation of dialysis; suspect mostly related to fluid volume  Labs: phosphorus 2.4 (up from 1.2), potassium 3.2, sodium 134, no CBGs Meds: folic acid, MVI 3 days weekly, Vitamin  C  Diet Order:  Diet Carb Modified Fluid consistency: Thin; Room service appropriate? Yes; Fluid restriction: 1200 mL Fluid  Skin:  Reviewed, no issues  Last BM:  9/17  Height:   Ht Readings from Last 1 Encounters:  01/29/17 4\' 11"  (1.499 m)    Weight:   Wt Readings from Last 1 Encounters:  02/02/17 105 lb 13.1 oz (48 kg)    Ideal Body Weight:     BMI:  Body mass index is 21.37 kg/m.  Estimated Nutritional Needs:   Kcal:  1550-1750 kcals  Protein:  78-88 g  Fluid:  >/= 1.2 L  EDUCATION NEEDS:   No education needs identified at this time  [place], [place], LDN [phone] Pager  [phone] Weekend/On-Call Pager

## 2017-02-02 NOTE — Progress Notes (Signed)
Occupational Therapy Treatment Patient Details Name: Valerie Terrell MRN: [id number] DOB: [birth year] Today's Date: 02/02/2017    History of present illness Pt is an 83 y.o. female who presented to the ED for evaluation of bleeding head wound and R back pain after a fall. In the ED, hypoxic at 88% on her supplemental oxygen, tachypneic, mildly hypertensive, chest x-ray showed nondisplaced right ninth rib fracture without complicating features as well as multiple old right rib fractures with healing. She was briefly placed on nonrebreather mask. Admitted for acute on chronic hypoxic respiratory failure secondary to acute on chronic systolic and diastolic CHF and chest wall splinting related to pain from rib fracture. Pt now with ESRD and started on HD. PMH significant for: chronic combined diastolic and systolic CHF, NICM, stage IV chronic kidney disease, CAD, PAF on warfarin, home oxygen 3-4 L/m, HLD, HTN, and CVA.   OT comments  Pt feeling considerably weaker this afternoon vs this morning. Performed toileting and seated grooming only. Plan is now for pt to return home with her daughter. Updated equipment needs after discussing with daughter. Pt may refuse hospital bed.   Follow Up Recommendations  Home health OT;Supervision/Assistance - 24 hour (pt is refusing SNF)    Equipment Recommendations  Wheelchair (measurements OT);Wheelchair cushion (measurements OT);Hospital bed    Recommendations for Other Services      Precautions / Restrictions Precautions Precautions: Fall       Mobility Bed Mobility Overal bed mobility: Needs Assistance Bed Mobility: Supine to Sit     Supine to sit: Modified independent (Device/Increase time)     General bed mobility comments: increased time, HOB up  Transfers Overall transfer level: Needs assistance Equipment used: 1 person hand held assist Transfers: Sit to/from [facility] Sit to Stand: Min guard Stand pivot transfers: Min  assist       General transfer comment: steadying assist needed    Balance Overall balance assessment: Needs assistance   Sitting balance-Leahy Scale: Fair       Standing balance-Leahy Scale: Poor                             ADL either performed or assessed with clinical judgement   ADL Overall ADL's : Needs assistance/impaired     Grooming: Wash/dry hands;Sitting;Set up                   Toilet Transfer: Minimal assistance;Stand-pivot;BSC   Toileting- Clothing Manipulation and Hygiene: Maximal assistance;Sit to/from stand Toileting - Clothing Manipulation Details (indicate cue type and reason): pt not attempting to perform pericare, asking for assistance       General ADL Comments: pt fatigued following HD today, per daughter pt is wanting to go home, talked with daughter about w/c and hospital bed     Vision       Perception     Praxis      Cognition Arousal/Alertness: Awake/alert Behavior During Therapy: Agitated Overall Cognitive Status: History of cognitive impairments - at baseline Area of Impairment: Following commands;Safety/judgement;Awareness                   Current Attention Level: Selective     Safety/Judgement: Decreased awareness of deficits;Decreased awareness of safety   Problem Solving: Difficulty sequencing;Requires verbal cues General Comments: pt more demanding than in previous session, daughter reports pt is getting back to being feisty        Exercises     Shoulder  Instructions       General Comments      Pertinent Vitals/ Pain       Pain Assessment: No/denies pain  Home Living                                          Prior Functioning/Environment              Frequency  Min 2X/week        Progress Toward Goals  OT Goals(current goals can now be found in the care plan section)  Progress towards OT goals: Not progressing toward goals - comment (pt with  fatigue)  Acute Rehab OT Goals Patient Stated Goal: to go home OT Goal Formulation: With patient Time For Goal Achievement: 02/02/17 Potential to Achieve Goals: Good  Plan Discharge plan needs to be updated    Co-evaluation                 AM-PAC PT "6 Clicks" Daily Activity     Outcome Measure   Help from another person eating meals?: None Help from another person taking care of personal grooming?: A Little Help from another person toileting, which includes using toliet, bedpan, or urinal?: A Lot Help from another person bathing (including washing, rinsing, drying)?: A Lot Help from another person to put on and taking off regular upper body clothing?: A Little Help from another person to put on and taking off regular lower body clothing?: A Lot 6 Click Score: 16    End of Session    OT Visit Diagnosis: Unsteadiness on feet (R26.81);Muscle weakness (generalized) (M62.81);Pain   Activity Tolerance Patient limited by fatigue   Patient Left in bed;with call bell/phone within reach;with nursing/sitter in room;with family/visitor present   Nurse Communication          Time: 8768-1157 OT Time Calculation (min): 15 min  Charges: OT General Charges $OT Visit: 1 Visit OT Treatments $Self Care/Home Management : 8-22 mins  02/02/2017 [name], OTR/L Pager: [phone]   [name] [name] 02/02/2017, 4:41 PM

## 2017-02-02 NOTE — Progress Notes (Signed)
[name], female   DOB: [birth year], 83 y.o.   MRN: [id number]   PROGRESS NOTE   Valerie Terrell  PXT:[id number] DOB: 1933-02-10 DOA: 01/10/2017  PCP: Tammi Sou, MD  Brief Narrative:  Pt is 83 yo female, ambulated with the help of a cane/walker prior to admission, daughter lives with her, PMH of chronic combined diastolic and systolic CHF, NICM, stage IV chronic kidney disease, solitary functioning right kidney, CAD, PAF on warfarin, home oxygen 3-4 L/m, HLD, HTN, CVA admitted to the hospital on 01/10/17 when she presented with bleeding from a head wound, frequent falls, acute on chronic hypoxic respiratory failure, nondisplaced right ninth rib fracture, acute on chronic systolic and diastolic CHF.   Assessment & Plan:   Acute on chronic systolic chf/ Acute on chronic hypoxic respiratory failure (home o2 3-4liters at baseline): - Patient did not respond to high-dose Lasix. Cardiology and nephrology were consulted. Nephrology initiated hemodialysis on 9/14 and volume management across dialysis. - She completed 8 days of Levaquin on 9/5 for presumed pneumonia. -Nonischemic cardiomyopathy (no regional wall motion on echo, negative stress test),EF of 35-40% by echo in July. - Clinically improved. Currently saturating in the high 90s on 4 L/m. Taper oxygen as tolerated for saturations >90%. - Volume status much improved.  Drowsiness/encephalopathy? - Patient was found to be drowsy on the morning of 9/5. This was felt to be multifactorial due to worsening CHF, hypoxia, hyponatremia, uremia. - She initiated dialysis on 9/14. - Resolved.  Hyponatremia: from fluids overload?  Sodium nadir to 127 on 9/6 sodium improved and normalized since 9/10  Hypokalemia; As per nephrology.  Paroxysmal atrial fibrillation with h/o tachy-brady s/p pacemaker placement in 11/2016:  -H/o subarachnoid hemorrhage after syncope while on xarelto in 6270, h/o embolic cva while on  anticoagulation -Currently paced rhythm, Continue coreg/amiodarone and Coumadin.  -Coumadin per pharmacy , inr 2.45 -Cardiology consulted and signed off   Acute kidney injury on Chronic kidney disease stage IV, new ESRD:  -solitary kidney (History of a nonfunctioning left kidney with mild right renal artery stenosis demonstrated by duplex ultrasound October 2017.) - nephrology input appreciated, HD #1 01/29/17.  CLIP in process -- Avoid nephrotoxins.  - Patient and family declined SNF placement at discharge. - Discussed with nephrology 9/18: Will dialyze today and aim to get her on a TTS schedule. Awaiting CLIP process.  Anemia , normocytic, no sign of bleed, last fobt in 11/2016 was negative, FOBT negative.  Low folate, started folate supplement,  iron/b12 adequate. tibili wnl. Calcium low Likely component of anemia of chronic disease Consider transfusion if hgb less than 7.  Nephrology started epo once a week during HD.  Hemoglobin stable in the mid 8 g per DL range.  Essential hypertension: currently on  amlodipine, Coreg, hydralazine, Imdur. Hopefully dialysis will help blood pressure control. Blood pressures still fluctuating and mildly uncontrolled.  Undisplaced right ninth rib fracture without complicating feature, multiple old right rib fractures with healing: Pain management, incentive spirometer and supportive care. PT OT evaluation. No further pain reported.  Forehead abrasion: s/p suture on admission.  No active bleeding for several days.  Generalized anxiety/depression:  Started Lexapro 5 mg daily at bedtime and mirtazapine 7.5 mg at bedtimeby psychiatrist. Encourage oral intake. Patient report she can not tolerate this meds due to it make her confused, she request these meds to be discontinued, infact, she has been refusing it for several days and hence meds discontinued on 9/15  Non-severe (moderate) malnutrition in context  of chronic illness Body mass index is  23.83 kg/m. nutriton consulted, on nutrition supplement As per dietitian input 9/18, change diet to regular with fluid restriction due to ongoing poor oral intake.  FTT: this is her 5 hospitalizations this year, daughter report 40pounds of weight loss this year. PT/OT,  SNF placement was recommended but patient and daughter declined and plan to go home with home health services.     DVT prophylaxis: Therapeutic on Coumadin per pharmacy  Code Status: Full code Family Communication: none at bedside  Disposition Plan: clip in process, need nephrology clearance for discharge, home with home health services at discharge .    Consultants:  Cardiology, signed off on 9/7   Nephrology  Psychiatry, signed off on 9/2  VVS  Procedures:   Right IJ hemodialysis catheter placement by vascular surgery on 01/29/2017 Dialysis started on 9/14  Antimicrobials: Levaquin from admission to 9/5.   Subjective: Patient denies complaints. No pain or dyspnea reported. Again reports that she is not really keen on going to a SNF and wishes to go home. Seen this morning prior to HD.   Objective: Vitals:   02/02/17 1230 02/02/17 1300 02/02/17 1330 02/02/17 1400  BP: (!) 126/37 (!) 131/46 (!) 117/47 (!) 125/53  Pulse: 60 65 67 61  Resp:      Temp:      TempSrc:      SpO2:      Weight:      Height:      Respiratory rate 21/m, temperature 98.17F and oxygen saturation 99%.  Intake/Output Summary (Last 24 hours) at 02/02/17 1539 Last data filed at 02/02/17 1006  Gross per 24 hour  Intake              360 ml  Output               50 ml  Net              310 ml   Filed Weights   02/01/17 1036 02/02/17 0529 02/02/17 1200  Weight: 48 kg (105 lb 13.1 oz) 46.5 kg (102 lb 9.6 oz) 48 kg (105 lb 13.1 oz)   Physical Exam  Constitutional: . Frail, chronically ill appearing, fully alert, oriented x3, Sitting up comfortably in chair this morning. CVS:S1 and S2 heard, RRR. No JVD or murmurs.  No pedal edema. Telemetry: Sinus rhythm and intermittent atrial paced rhythm. Stable without change. Pulmonary: Clear to auscultation. No increased work of breathing.  Abdominal: nondistended, soft and nontender. Normal bowel sounds heard. Stable.  Musculoskeletal: Symmetrically moves all extremities. No pedal edema. Old/healing area of ecchymosis over left mid lateral calf. Stable.  Neuro:  alert and oriented 3: No focal neurological deficits.  Skin: Skin is warm and dry.  Psychiatric: Normal mood and affect.     Data Reviewed: I have personally reviewed following labs and imaging studies  CBC:  Recent Labs Lab 01/29/17 0541 01/30/17 0610 02/01/17 0733  WBC 7.6 8.4 9.2  HGB 8.8* 8.6* 8.7*  HCT 29.7* 29.4* 29.3*  MCV 97.4 99.7 97.7  PLT 216 191 532   Basic Metabolic Panel:  Recent Labs Lab 01/27/17 0454 01/28/17 0524 01/29/17 0541 01/30/17 0610 01/31/17 1832 02/01/17 0733  NA 136 136 137 138  --  134*  K 3.1* 3.4* 3.6 4.2  --  3.2*  CL 95* 94* 93* 101  --  98*  CO2 32 35* 35* 29  --  28  GLUCOSE 99 90 89 108*  --  91  BUN 56* 52* 48* 31*  --  8  CREATININE 2.21* 1.90* 1.89* 1.64*  --  1.51*  CALCIUM 8.9 8.8* 8.7* 8.7*  --  8.7*  PHOS  --   --   --   --  1.2* 2.4*   Liver Function Tests:  Recent Labs Lab 01/27/17 0454 01/29/17 1437 02/01/17 0733  ALT  --  13*  --   ALBUMIN 2.9*  --  3.0*   No results for input(s): AMMONIA in the last 168 hours. Coagulation Profile:  Recent Labs Lab 01/29/17 0541 01/30/17 0610 01/31/17 0432 02/01/17 0435 02/02/17 0522  INR 2.04 1.79 2.04 2.25 2.45   Urine analysis:    Component Value Date/Time   COLORURINE YELLOW 01/20/2017 1737   APPEARANCEUR CLEAR 01/20/2017 1737   LABSPEC 1.012 01/20/2017 1737   PHURINE 5.0 01/20/2017 1737   GLUCOSEU NEGATIVE 01/20/2017 1737   HGBUR NEGATIVE 01/20/2017 1737   BILIRUBINUR NEGATIVE 01/20/2017 1737   BILIRUBINUR negative 09/24/2016 1453   KETONESUR NEGATIVE 01/20/2017 1737     PROTEINUR 30 (A) 01/20/2017 1737   UROBILINOGEN 0.2 09/24/2016 1453   NITRITE NEGATIVE 01/20/2017 1737   LEUKOCYTESUR NEGATIVE 01/20/2017 1737   Recent Results (from the past 240 hour(s))  Surgical PCR screen     Status: None   Collection Time: 01/29/17 12:43 AM  Result Value Ref Range Status   MRSA, PCR NEGATIVE NEGATIVE Final   Staphylococcus aureus NEGATIVE NEGATIVE Final    Comment: (NOTE) The Xpert SA Assay (FDA approved for NASAL specimens in patients 54 years of age and older), is one component of a comprehensive surveillance program. It is not intended to diagnose infection nor to guide or monitor treatment.     Radiology Studies: Dg Chest Port 1 View Result Date: 01/20/2017 CHF with mild interstitial edema. Left lower lobe atelectasis and probable left pleural effusion. The ICD is in stable position. Thoracic aortic atherosclerosis.   Scheduled Meds: . acetaminophen  1,000 mg Oral QHS  . amiodarone  200 mg Oral Daily  . amLODipine  5 mg Oral Daily  . calcium-vitamin D  1 tablet Oral BID  . carvedilol  25 mg Oral BID WC  . darbepoetin (ARANESP) injection - DIALYSIS  100 mcg Intravenous Q Sat-HD  . folic acid  1 mg Oral Daily  . hydrALAZINE  100 mg Oral TID  . isosorbide mononitrate  60 mg Oral Daily  . multivitamin with minerals  1 tablet Oral Once per day on Mon Wed Fri  . pravastatin  80 mg Oral q1800  . sodium chloride flush  3 mL Intravenous Q12H  . vitamin C  500 mg Oral Daily  . warfarin  3 mg Oral ONCE-1800  . Warfarin - Pharmacist Dosing Inpatient   Does not apply q1800   Continuous Infusions: . sodium chloride    . sodium chloride    . sodium chloride      LOS: 22 days   Time spent: 15 minutes   Valerie Cederberg, MD, FACP, FHM. [facility] Pager [phone]  If 7PM-7AM, please contact night-coverage [email or web address] Password [facility] 02/02/2017, 3:39 PM

## 2017-02-02 NOTE — Plan of Care (Signed)
Problem: Activity: Goal: Ability to tolerate increased activity will improve Outcome: Not Progressing Pt. is tired after dialysis- hopefully once pt. gets on a consistent schedule, her activities will increase.

## 2017-02-02 NOTE — Progress Notes (Addendum)
Physical Therapy Treatment Patient Details Name: Valerie Terrell MRN: [id number] DOB: 05/25/1932 Today's Date: 02/02/2017    History of Present Illness Pt is an 83 y.o. female who presented to the ED for evaluation of bleeding head wound and R back pain after a fall. In the ED, hypoxic at 88% on her supplemental oxygen, tachypneic, mildly hypertensive, chest x-ray showed nondisplaced right ninth rib fracture without complicating features as well as multiple old right rib fractures with healing. She was briefly placed on nonrebreather mask. Admitted for acute on chronic hypoxic respiratory failure secondary to acute on chronic systolic and diastolic CHF and chest wall splinting related to pain from rib fracture. PMH significant for: chronic combined diastolic and systolic CHF, NICM, stage IV chronic kidney disease, CAD, PAF on warfarin, home oxygen 3-4 L/m, HLD, HTN, and CVA.    PT Comments    Pt admitted with above diagnosis. Pt currently with functional limitations due to balance and endurance deficits. 3/4 goals met.  Pt no longer needs to complete steps as they have had ramp built into home.  Goals revised.  Also updated d/c plan as daughter is taking pt home.  Will need HHPT at home.  HAs equipment.  Will continue PT while pt in hospital.   Pt will benefit from skilled PT to increase their independence and safety with mobility to allow discharge to the venue listed below.     Follow Up Recommendations  Home health PT;Supervision/Assistance - 24 hour     Equipment Recommendations  None recommended by PT    Recommendations for Other Services       Precautions / Restrictions Precautions Precautions: Fall Restrictions Weight Bearing Restrictions: No    Mobility  Bed Mobility Overal bed mobility: Needs Assistance Bed Mobility: Supine to Sit     Supine to sit: Modified independent (Device/Increase time)     General bed mobility comments:  increased time and  effort  Transfers Overall transfer level: Needs assistance Equipment used: Rolling walker (2 wheeled) Transfers: Sit to/from [facility] Sit to Stand: Modified independent (Device/Increase time)         General transfer comment: No assist needed and steady upon standing.   Ambulation/Gait Ambulation/Gait assistance: Min guard Ambulation Distance (Feet): 150 Feet Assistive device: Rolling walker (2 wheeled) Gait Pattern/deviations: Trunk flexed;Step-through pattern;Decreased stride length;Narrow base of support;Shuffle;Drifts right/left Gait velocity: decreased Gait velocity interpretation: Below normal speed for age/gender General Gait Details: Cues for management of RW; cues for posture, increased bilat step length, and cadence as well as to stay close to RW and steer around obstacles.  No physical assist needed just cues.    Stairs            Wheelchair Mobility    Modified Rankin (Stroke Patients Only)       Balance Overall balance assessment: Needs assistance Sitting-balance support: No upper extremity supported;Feet supported Sitting balance-Leahy Scale: Fair     Standing balance support: Bilateral upper extremity supported;During functional activity Standing balance-Leahy Scale: Poor Standing balance comment: Reliant on external assistance or UE support.              High level balance activites: Direction changes;Turns;Sudden stops High Level Balance Comments: min guard assist with above            Cognition Arousal/Alertness: Awake/alert Behavior During Therapy: WFL for tasks assessed/performed Overall Cognitive Status: History of cognitive impairments - at baseline Area of Impairment: Following commands;Safety/judgement;Awareness  Orientation Level: Time (year) Current Attention Level: Selective   Following Commands: Follows one step commands consistently Safety/Judgement: Decreased awareness of  deficits;Decreased awareness of safety Awareness: Emergent Problem Solving: Difficulty sequencing;Requires verbal cues        Exercises General Exercises - Lower Extremity Ankle Circles/Pumps: AROM;Both;20 reps;Seated Long Arc Quad: AROM;Both;15 reps;Seated    General Comments General comments (skin integrity, edema, etc.): SpO2 94% on 4LO2 with activity.       Pertinent Vitals/Pain Pain Assessment: No/denies pain    Home Living                      Prior Function            PT Goals (current goals can now be found in the care plan section) Acute Rehab PT Goals PT Goal Formulation: With patient Time For Goal Achievement: 01/27/17 Potential to Achieve Goals: Good Progress towards PT goals: Progressing toward goals    Frequency    Min 3X/week      PT Plan Discharge plan needs to be updated    Co-evaluation              AM-PAC PT "6 Clicks" Daily Activity  Outcome Measure  Difficulty turning over in bed (including adjusting bedclothes, sheets and blankets)?: None Difficulty moving from lying on back to sitting on the side of the bed? : None Difficulty sitting down on and standing up from a chair with arms (e.g., wheelchair, bedside commode, etc,.)?: None Help needed moving to and from a bed to chair (including a wheelchair)?: A Little Help needed walking in hospital room?: A Little Help needed climbing 3-5 steps with a railing? : A Lot 6 Click Score: 20    End of Session Equipment Utilized During Treatment: Gait belt;Oxygen Activity Tolerance: Patient tolerated treatment well Patient left: with call bell/phone within reach;in chair;with chair alarm set Nurse Communication: Mobility status PT Visit Diagnosis: Unsteadiness on feet (R26.81);Other abnormalities of gait and mobility (R26.89);Repeated falls (R29.6);Muscle weakness (generalized) (M62.81);History of falling (Z91.81);Difficulty in walking, not elsewhere classified (R26.2)     Time:  1364-3837 PT Time Calculation (min) (ACUTE ONLY): 20 min  Charges:  $Gait Training: 8-22 mins                    G Codes:       Laraya Pestka,PT Acute Rehabilitation [phone] (pager)    Denice Paradise 02/02/2017, 9:51 AM

## 2017-02-03 ENCOUNTER — Telehealth: Payer: Self-pay | Admitting: Cardiovascular Disease

## 2017-02-03 ENCOUNTER — Other Ambulatory Visit: Payer: Self-pay

## 2017-02-03 DIAGNOSIS — N189 Chronic kidney disease, unspecified: Secondary | ICD-10-CM

## 2017-02-03 DIAGNOSIS — I5023 Acute on chronic systolic (congestive) heart failure: Secondary | ICD-10-CM

## 2017-02-03 LAB — PROTIME-INR
INR: 2.44
PROTHROMBIN TIME: 26.3 s — AB (ref 11.4–15.2)

## 2017-02-03 MED ORDER — WARFARIN SODIUM 3 MG PO TABS: 1.5000 mg | ORAL_TABLET | Freq: Once | ORAL | Status: DC

## 2017-02-03 MED ORDER — AMIODARONE HCL 200 MG PO TABS: 200.0000 mg | ORAL_TABLET | Freq: Every day | ORAL | 0 refills | Status: DC

## 2017-02-03 MED ORDER — FOLIC ACID 1 MG PO TABS: 1.0000 mg | ORAL_TABLET | Freq: Every day | ORAL | 0 refills | Status: DC

## 2017-02-03 NOTE — Progress Notes (Signed)
Pt has orders to be discharged. Discharge instructions given and pt has no additional questions at this time. Medication regimen reviewed and pt educated. Pt verbalized understanding and has no additional questions. Telemetry box removed. IV removed and site in good condition. Pt stable and waiting for transportation.  Creedence Kunesh RN 

## 2017-02-03 NOTE — Progress Notes (Signed)
[name], female   DOB: 09-09-1932, 83 y.o.   MRN: [id number]   PROGRESS NOTE   Naama Sappington  JJH:[id number] DOB: 14-Jun-1932 DOA: 01/10/2017  PCP: Tammi Sou, MD  Brief Narrative:  Pt is 83 yo female, ambulated with the help of a cane/walker prior to admission, daughter lives with her, PMH of chronic combined diastolic and systolic CHF, NICM, stage IV chronic kidney disease, solitary functioning right kidney, CAD, PAF on warfarin, home oxygen 3-4 L/m, HLD, HTN, CVA admitted to the hospital on 01/10/17 when she presented with bleeding from a head wound, frequent falls, acute on chronic hypoxic respiratory failure, nondisplaced right ninth rib fracture, acute on chronic systolic and diastolic CHF. Could not diurese adequately with IV heparin. Subsequently deemed new ESRD and started on intermittent HD. Currently waiting for CLIP.  Assessment & Plan:   Acute on chronic systolic chf/ Acute on chronic hypoxic respiratory failure (home o2 3-4liters at baseline): - Patient did not respond to high-dose Lasix. Cardiology and nephrology were consulted. Nephrology initiated hemodialysis on 9/14 and volume management across dialysis. - She completed 8 days of Levaquin on 9/5 for presumed pneumonia. -Nonischemic cardiomyopathy (no regional wall motion on echo, negative stress test),EF of 35-40% by echo in July. - Clinically improved. Currently saturating in the high 90s on 4 L/m. Taper oxygen as tolerated for saturations >90%. This was discussed with RN. - Volume status much improved.  Drowsiness/encephalopathy? - Patient was found to be drowsy on the morning of 9/5. This was felt to be multifactorial due to worsening CHF, hypoxia, hyponatremia, uremia. - She initiated dialysis on 9/14. - Resolved.  Hyponatremia: from fluids overload?  Sodium nadir to 127 on 9/6 sodium improved and normalized since 9/10  Hypokalemia; As per nephrology.  Paroxysmal atrial fibrillation  with h/o tachy-brady s/p pacemaker placement in 11/2016:  -H/o subarachnoid hemorrhage after syncope while on xarelto in 8185, h/o embolic cva while on anticoagulation -Currently paced rhythm, Continue coreg/amiodarone and Coumadin.  -Coumadin per pharmacy , inr therapeutic -Cardiology consulted and signed off   Acute kidney injury on Chronic kidney disease stage IV, new ESRD:  -solitary kidney (History of a nonfunctioning left kidney with mild right renal artery stenosis demonstrated by duplex ultrasound October 2017.) - nephrology input appreciated, HD #1 01/29/17.  CLIP in process -- Avoid nephrotoxins.  - Patient and family declined SNF placement at discharge. - As per nephrology follow-up, now ESRD, tolerating intermittent HD via [facility]. VVS deferred AV access to outpatient. CLIP pending.  Anemia , normocytic, no sign of bleed, last fobt in 11/2016 was negative, FOBT negative.  Low folate, started folate supplement,  iron/b12 adequate. tibili wnl. Calcium low Likely component of anemia of chronic disease Consider transfusion if hgb less than 7.  Nephrology started epo once a week during HD.  Hemoglobin stable in the mid 8 g per DL range.  Essential hypertension: currently on  amlodipine, Coreg, hydralazine, Imdur. Blood pressure is better controlled.  Undisplaced right ninth rib fracture without complicating feature, multiple old right rib fractures with healing: Pain management, incentive spirometer and supportive care. PT OT evaluation. Patient has not reported any pain for a couple of days now.  Forehead abrasion: s/p suture on admission.  No active bleeding for several days.  Generalized anxiety/depression:  Started Lexapro 5 mg daily at bedtime and mirtazapine 7.5 mg at bedtimeby psychiatrist. Encourage oral intake. Patient report she can not tolerate this meds due to it make her confused, she request these meds to  be discontinued, infact, she has been refusing it for  several days and hence meds discontinued on 9/15  Non-severe (moderate) malnutrition in context of chronic illness Body mass index is 23.83 kg/m. nutriton consulted, on nutrition supplement As per dietitian input 9/18, changed diet to regular with fluid restriction due to ongoing poor oral intake. Patient states that she does not like hospital food and will be able to eat better at home and her daughter cooks well.  FTT: this is her 5 hospitalizations this year, daughter report 40pounds of weight loss this year. PT/OT,  SNF placement was recommended but patient and daughter declined and plan to go home with home health services.     DVT prophylaxis: Therapeutic on Coumadin per pharmacy  Code Status: Full code Family Communication: none at bedside  Disposition Plan: clip in process, need nephrology clearance for discharge, home with home health services at discharge .    Consultants:  Cardiology, signed off on 9/7   Nephrology  Psychiatry, signed off on 9/2  VVS  Procedures:   Right IJ hemodialysis catheter placement by vascular surgery on 01/29/2017 Dialysis started on 9/14  Antimicrobials: Levaquin from admission to 9/5.   Subjective: Does not like hospital food and hence has not been eating much but denies complaints. No dyspnea, chest pain or palpitations reported. No pain reported for several days now.  Objective: Vitals:   02/02/17 1947 02/02/17 2229 02/03/17 0646 02/03/17 1109  BP: (!) 119/48 (!) 103/42 (!) 129/56 (!) 138/58  Pulse: 62  (!) 59   Resp: 20  20   Temp: 98 F (36.7 C)  98.6 F (37 C)   TempSrc: Oral  Oral   SpO2: 100%  100% 100%  Weight:   45.3 kg (99 lb 12.8 oz)   Height:        Intake/Output Summary (Last 24 hours) at 02/03/17 1212 Last data filed at 02/02/17 2200  Gross per 24 hour  Intake              100 ml  Output             2175 ml  Net            -2075 ml   Filed Weights   02/02/17 1200 02/02/17 1513 02/03/17 0646    Weight: 48 kg (105 lb 13.1 oz) 46 kg (101 lb 6.6 oz) 45.3 kg (99 lb 12.8 oz)   Physical Exam  Constitutional: . Frail, chronically ill appearing, fully alert, oriented x3, Sitting up comfortably in chair this morning.Stable. CVS:S1 and S2 heard, RRR. No JVD or murmurs. No pedal edema. Telemetry: Atrial paced rhythm. Stable without change. Pulmonary: Clear to auscultation. No increased work of breathing. Stable Abdominal: nondistended, soft and nontender. Normal bowel sounds heard. Stable  Musculoskeletal: Symmetrically moves all extremities. No pedal edema. Old/healing area of ecchymosis over left mid lateral calf. Stable.  Neuro:  alert and oriented 3: No focal neurological deficits.  Skin: Skin is warm and dry.  Psychiatric: Normal mood and affect.     Data Reviewed: I have personally reviewed following labs and imaging studies  CBC:  Recent Labs Lab 01/29/17 0541 01/30/17 0610 02/01/17 0733  WBC 7.6 8.4 9.2  HGB 8.8* 8.6* 8.7*  HCT 29.7* 29.4* 29.3*  MCV 97.4 99.7 97.7  PLT 216 191 696   Basic Metabolic Panel:  Recent Labs Lab 01/28/17 0524 01/29/17 0541 01/30/17 0610 01/31/17 1832 02/01/17 0733  NA 136 137 138  --  134*  K 3.4* 3.6 4.2  --  3.2*  CL 94* 93* 101  --  98*  CO2 35* 35* 29  --  28  GLUCOSE 90 89 108*  --  91  BUN 52* 48* 31*  --  8  CREATININE 1.90* 1.89* 1.64*  --  1.51*  CALCIUM 8.8* 8.7* 8.7*  --  8.7*  PHOS  --   --   --  1.2* 2.4*   Liver Function Tests:  Recent Labs Lab 01/29/17 1437 02/01/17 0733  ALT 13*  --   ALBUMIN  --  3.0*   No results for input(s): AMMONIA in the last 168 hours. Coagulation Profile:  Recent Labs Lab 01/30/17 0610 01/31/17 0432 02/01/17 0435 02/02/17 0522 02/03/17 0458  INR 1.79 2.04 2.25 2.45 2.44   Urine analysis:    Component Value Date/Time   COLORURINE YELLOW 01/20/2017 1737   APPEARANCEUR CLEAR 01/20/2017 1737   LABSPEC 1.012 01/20/2017 1737   PHURINE 5.0 01/20/2017 1737   GLUCOSEU  NEGATIVE 01/20/2017 1737   HGBUR NEGATIVE 01/20/2017 1737   BILIRUBINUR NEGATIVE 01/20/2017 1737   BILIRUBINUR negative 09/24/2016 1453   KETONESUR NEGATIVE 01/20/2017 1737   PROTEINUR 30 (A) 01/20/2017 1737   UROBILINOGEN 0.2 09/24/2016 1453   NITRITE NEGATIVE 01/20/2017 1737   LEUKOCYTESUR NEGATIVE 01/20/2017 1737   Recent Results (from the past 240 hour(s))  Surgical PCR screen     Status: None   Collection Time: 01/29/17 12:43 AM  Result Value Ref Range Status   MRSA, PCR NEGATIVE NEGATIVE Final   Staphylococcus aureus NEGATIVE NEGATIVE Final    Comment: (NOTE) The Xpert SA Assay (FDA approved for NASAL specimens in patients 47 years of age and older), is one component of a comprehensive surveillance program. It is not intended to diagnose infection nor to guide or monitor treatment.     Radiology Studies: Dg Chest Port 1 View Result Date: 01/20/2017 CHF with mild interstitial edema. Left lower lobe atelectasis and probable left pleural effusion. The ICD is in stable position. Thoracic aortic atherosclerosis.   Scheduled Meds: . acetaminophen  1,000 mg Oral QHS  . amiodarone  200 mg Oral Daily  . amLODipine  5 mg Oral Daily  . calcium-vitamin D  1 tablet Oral BID  . carvedilol  25 mg Oral BID WC  . darbepoetin (ARANESP) injection - DIALYSIS  100 mcg Intravenous Q Sat-HD  . folic acid  1 mg Oral Daily  . hydrALAZINE  100 mg Oral TID  . isosorbide mononitrate  60 mg Oral Daily  . multivitamin with minerals  1 tablet Oral Once per day on Mon Wed Fri  . pravastatin  80 mg Oral q1800  . sodium chloride flush  3 mL Intravenous Q12H  . vitamin C  500 mg Oral Daily  . warfarin  1.5 mg Oral ONCE-1800  . Warfarin - Pharmacist Dosing Inpatient   Does not apply q1800   Continuous Infusions: . sodium chloride    . sodium chloride    . sodium chloride      LOS: 23 days   Time spent: 15 minutes   Priyansh Pry, MD, FACP, FHM. [facility] Pager [phone]  If  7PM-7AM, please contact night-coverage [email or web address] Password [email or web address] 02/03/2017, 12:12 PM

## 2017-02-03 NOTE — Progress Notes (Signed)
ANTICOAGULATION CONSULT NOTE - Follow Up Consult  Pharmacy Consult for Warfarin Indication: atrial fibrillation  Patient Measurements: Height: 4\' 11"  (149.9 cm) Weight: 99 lb 12.8 oz (45.3 kg) (scale c) IBW/kg (Calculated) : 43.2  Vital Signs: Temp: 98.6 F (37 C) (09/19 0646) Temp Source: Oral (09/19 0646) BP: 129/56 (09/19 0646) Pulse Rate: 59 (09/19 0646)  Recent Labs  02/01/17 0435 02/01/17 0733 02/02/17 0522 02/03/17 0458  HGB  --  8.7*  --   --   HCT  --  29.3*  --   --   PLT  --  165  --   --   LABPROT 24.7*  --  26.4* 26.3*  INR 2.25  --  2.45 2.44  CREATININE  --  1.51*  --   --     Estimated Creatinine Clearance: 19.9 mL/min (A) (by C-G formula based on SCr of 1.51 mg/dL (H)).  Assessment:  83yof continues on warfarin for hx afib/CVA. INR therapeutic at 2.44. Doses held 9/12 and 9/13 for HD cath placement. She was on amiodarone 200mg  bid pta but dose has been reduced to 100mg  daily.  PTA dose: 3mg  daily exc for 1.5mg  on MWF  Goal of Therapy:  INR 2-3 Monitor platelets by anticoagulation protocol: Yes   Plan:  1) Warfarin 1.5mg  tonight 2) Daily INR  Nena Jordan, PharmD, BCPS 02/03/2017 8:45 AM

## 2017-02-03 NOTE — Procedures (Signed)
Admit: 01/10/2017 LOS: 23  49F New ESRD, A/C sCHF exacerbation  Subjective:  HD yesterday: 2.1 L net ultrafiltration, 3 hours, 46 kg  post weight; very washed out CLIP in process   09/18 0701 - 09/19 0700 In: 220 [P.O.:220] Out: 2175 [Urine:50]  Filed Weights   02/02/17 1200 02/02/17 1513 02/03/17 0646  Weight: 48 kg (105 lb 13.1 oz) 46 kg (101 lb 6.6 oz) 45.3 kg (99 lb 12.8 oz)    Scheduled Meds: . acetaminophen  1,000 mg Oral QHS  . amiodarone  200 mg Oral Daily  . amLODipine  5 mg Oral Daily  . calcium-vitamin D  1 tablet Oral BID  . carvedilol  25 mg Oral BID WC  . darbepoetin (ARANESP) injection - DIALYSIS  100 mcg Intravenous Q Sat-HD  . folic acid  1 mg Oral Daily  . hydrALAZINE  100 mg Oral TID  . isosorbide mononitrate  60 mg Oral Daily  . multivitamin with minerals  1 tablet Oral Once per day on Mon Wed Fri  . pravastatin  80 mg Oral q1800  . sodium chloride flush  3 mL Intravenous Q12H  . vitamin C  500 mg Oral Daily  . warfarin  1.5 mg Oral ONCE-1800  . Warfarin - Pharmacist Dosing Inpatient   Does not apply q1800   Continuous Infusions: . sodium chloride    . sodium chloride    . sodium chloride     PRN Meds:.sodium chloride, sodium chloride, sodium chloride, acetaminophen, albuterol, alteplase, bisacodyl, heparin, hydrALAZINE, lidocaine (PF), lidocaine-prilocaine, ondansetron (ZOFRAN) IV, oxyCODONE-acetaminophen, pentafluoroprop-tetrafluoroeth, polyethylene glycol, sodium chloride flush  Current Labs: reviewed    Physical Exam:  Blood pressure (!) 129/56, pulse (!) 59, temperature 98.6 F (37 C), temperature source Oral, resp. rate 20, height 4\' 11"  (1.499 m), weight 45.3 kg (99 lb 12.8 oz), SpO2 100 %. NAD IRIR Diminished b/l No LEE AAO x3   A 1. AoCKD, now ESRD, tol iHD.  Using [facility], VVS deferred AV access to outpatient.  CLIP pending.  2. A/C sCHF Exacerbation: Use 46kg as EDW 3. HTN 4. CKD-BMD, stable 5. AFib with tachy/brady s/p PPM, on  warfarin 6. FTT: SNF deferred by patient 7. Anemia on ESA q Sat  P 1. HD cont on THS schedule; 4K, Goal EDW 46kg,  3.5h, Qb 400, no heparin 2. F/u on CLIP   [facility] MD 02/03/2017, 9:32 AM   Recent Labs Lab 01/29/17 0541 01/30/17 0610 01/31/17 1832 02/01/17 0733  NA 137 138  --  134*  K 3.6 4.2  --  3.2*  CL 93* 101  --  98*  CO2 35* 29  --  28  GLUCOSE 89 108*  --  91  BUN 48* 31*  --  8  CREATININE 1.89* 1.64*  --  1.51*  CALCIUM 8.7* 8.7*  --  8.7*  PHOS  --   --  1.2* 2.4*    Recent Labs Lab 01/29/17 0541 01/30/17 0610 02/01/17 0733  WBC 7.6 8.4 9.2  HGB 8.8* 8.6* 8.7*  HCT 29.7* 29.4* 29.3*  MCV 97.4 99.7 97.7  PLT 216 191 [facility] 9:32 AM

## 2017-02-03 NOTE — Discharge Summary (Signed)
Physician Discharge Summary  Laelle Bridgett FXT:[id number] DOB: Aug 16, 1932  PCP: Tammi Sou, MD  Admit date: 01/10/2017 Discharge date: 02/03/2017  Recommendations for Outpatient Follow-up:  1. Dr. Patrcia Dolly PCP on 02/05/17 at 11 AM 2. Kerin Ransom, PA-C/Cardiology on 02/11/17 at 11 AM. 3. Coumadin Clinic with [facility] at [facility]: I personally discussed with rounding Nephrologist and coordinated that INR checks will be done across HD and results will be forwarded to patient's Coumadin clinic for Coumadin dose adjustment. 4. Hemodialysis Center: Continue regular dialysis appointments on Tuesdays, Thursdays and Saturdays.  Home Health: PT, OT, RN, aide and Education officer, museum. Equipment/Devices: None    Discharge Condition: Improved and stable.  CODE STATUS: Full  Diet recommendation: Heart healthy diet.  Discharge Diagnoses:  Principal Problem:   Acute on chronic respiratory failure with hypoxia (HCC) Active Problems:   PAF (paroxysmal atrial fibrillation) (HCC)   Essential hypertension   CKD (chronic kidney disease), stage IV (HCC)   Acute combined systolic and diastolic heart failure (HCC) - possibly related to A. fib   Fracture of one rib, right side, initial encounter for closed fracture   Brief Summary: Pt is 83 yo female, ambulated with the help of a cane/walker prior to admission, daughter lives with her, PMH of chronic combined diastolic and systolic CHF, NICM, stage IV chronic kidney disease, solitary functioning right kidney, CAD, PAF on warfarin, home oxygen 3-4 L/m, HLD, HTN, CVA admitted to the hospital on 01/10/17 when she presented with bleeding from a head wound, frequent falls, acute on chronic hypoxic respiratory failure, nondisplaced right ninth rib fracture, acute on chronic systolic and diastolic CHF. Could not diurese adequately with IV Lasix. Subsequently deemed new ESRD and started on HD.   Assessment & Plan:   Acute on chronic systolic chf/ Acute on  chronic hypoxic respiratory failure (home o2 3-4liters at baseline): - Patient did not respond to high-dose Lasix. Cardiology and nephrology were consulted. Nephrology initiated hemodialysis on 9/14 and volume management across dialysis. - She completed 8 days of Levaquin on 9/5 for presumed pneumonia. - Nonischemic cardiomyopathy (no regional wall motion on echo, negative stress test),EF of 35-40% by echo in July. - Clinically improved and back on home level of Oxygen. May consider titrating down as outpatient as possible.  Drowsiness/encephalopathy? - Patient was found to be drowsy on the morning of 9/5. This was felt to be multifactorial due to worsening CHF, hypoxia, hyponatremia, uremia. - She initiated dialysis on 9/14. - Resolved.  Hyponatremia:from fluids overload?  Sodium nadir to 127 on 9/6 sodium improved and normalized since 9/10  Hypokalemia; As per nephrology.  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  -Coumadin per pharmacy , inr therapeutic -Cardiology consulted and signed off  - Discussed with pharmacy on day of discharge recommended continuing prior home dose of Coumadin. I also discussed with Dr. Joelyn Oms and arrange for patient to have PT/INR checked across dialysis and results to be forwarded to her Coumadin clinic for dose adjustment. -INR 2.4 at discharge.   Acute kidney injury on Chronic kidney disease stage IV, new ESRD:  -solitary kidney (History of a nonfunctioning left kidney with mild right renal artery stenosis demonstrated by duplex ultrasound October 2017.) - nephrology input appreciated, HD #1 01/29/17. CLIP in process -- Avoid nephrotoxins.  - Patient and family declined SNF placement at discharge. - As  per nephrology follow-up,  now ESRD, tolerating HD via [facility]. VVS deferred AV access to outpatient.  - I discussed with Dr. Joelyn Oms on day of discharge and he advised me to stop Boniva, continue Vit D at DC and that patient now has arrangement for outpatient HD and cleared for discharge.  Anemia , normocytic, no sign of bleed, last fobt in 11/2016 was negative, FOBT negative.  Low folate, startedfolate supplement,  iron/b12 adequate. tibili wnl. Calcium low Likely component of anemia of chronic disease Consider transfusion if hgb less than 7.  Nephrology started epo once a week during HD.  Hemoglobin stable in the mid 8 g per DL range.  Essential hypertension:currently on amlodipine, Coreg, hydralazine, Imdur. Blood pressure is better controlled.  Undisplaced right ninth rib fracture without complicating feature, multiple old right rib fractures with healing: Pain management, incentive spirometer and supportive care. PT OT evaluation. Patient has not reported any pain for a couple of days now. Pain seems to have resolved.  Forehead abrasion: s/p suture on admission.  Bleeding resolved.  Generalized anxiety/depression:  Started Lexapro 5 mg daily at bedtime and mirtazapine 7.5 mg at bedtimeby psychiatrist. Encourage oral intake. Patient report she can not tolerate this meds due to it make her confused, she request these meds to be discontinued, infact, she has been refusing it for several days and hence meds discontinued on 9/15  Non-severe (moderate) malnutrition in context of chronic illness Body mass index is 23.83 kg/m. nutriton consulted, on nutrition supplement As per dietitian input 9/18, changed diet to regular with fluid restriction due to ongoing poor oral intake. Patient states that she does not like hospital food and will be able to eat better at home and her daughter cooks well.  YBO:FBPZ is her 5 hospitalizations this year, daughter report 40pounds of weight loss this year. PT/OT,  SNF  placement was recommended but patient and daughter declined and plan to go home with home health services.     Consultants:  Cardiology  Nephrology  Psychiatry  VVS  Procedures:   Right IJ hemodialysis catheter placement by vascular surgery on 01/29/2017 Dialysis started on 9/14   Discharge Instructions  Discharge Instructions    Call MD for:  difficulty breathing, headache or visual disturbances    Complete by:  As directed    Call MD for:  extreme fatigue    Complete by:  As directed    Call MD for:  persistant dizziness or light-headedness    Complete by:  As directed    Call MD for:  persistant nausea and vomiting    Complete by:  As directed    Call MD for:  severe uncontrolled pain    Complete by:  As directed    Call MD for:  temperature >100.4    Complete by:  As directed    Diet - low sodium heart healthy    Complete by:  As directed    Discharge instructions    Complete by:  As directed    Please note that acetaminophen/Tylenol dose from all sources not to exceed more than 4 g per day.   Increase activity slowly    Complete by:  As directed        Medication List    STOP taking these medications   furosemide 80 MG tablet Commonly known as:  LASIX   ibandronate 150 MG tablet Commonly known as:  BONIVA   Potassium Chloride ER 20 MEQ Tbcr     TAKE these medications  acetaminophen 500 MG tablet Commonly known as:  TYLENOL Take 500 mg by mouth every 6 (six) hours as needed for moderate pain.   amiodarone 200 MG tablet Commonly known as:  PACERONE Take 1 tablet (200 mg total) by mouth daily. What changed:  when to take this   amLODipine 5 MG tablet Commonly known as:  NORVASC Take 1 tablet (5 mg total) by mouth daily.   CALCIUM/VITAMIN D PO Take 1 tablet by mouth 2 (two) times daily.   carvedilol 25 MG tablet Commonly known as:  COREG Take 1 tablet (25 mg total) by mouth 2 (two) times daily with a meal.   folic acid 1 MG  tablet Commonly known as:  FOLVITE Take 1 tablet (1 mg total) by mouth daily.   hydrALAZINE 100 MG tablet Commonly known as:  APRESOLINE Take 1 tablet (100 mg total) by mouth 3 (three) times daily.   isosorbide mononitrate 60 MG 24 hr tablet Commonly known as:  IMDUR Take 1 tablet (60 mg total) by mouth daily.   multivitamin tablet Take 1 tablet by mouth 3 (three) times a week.   ondansetron 4 MG tablet Commonly known as:  ZOFRAN Take 0.5-1 tablets (2-4 mg total) by mouth every 8 (eight) hours as needed for nausea or vomiting.   oxyCODONE-acetaminophen 5-325 MG tablet Commonly known as:  PERCOCET/ROXICET Take 1-2 tablets by mouth every 6 (six) hours as needed for severe pain.   pravastatin 40 MG tablet Commonly known as:  PRAVACHOL Take 40 mg by mouth every evening.   vitamin C 500 MG tablet Commonly known as:  ASCORBIC ACID Take 500 mg by mouth daily.   Vitamin D (Ergocalciferol) 50000 units Caps capsule Commonly known as:  DRISDOL 1 cap po q week x 12 weeks What changed:  how much to take  how to take this  when to take this  additional instructions   warfarin 3 MG tablet Commonly known as:  COUMADIN Take 1/2 to 1 tablet by mouth daily as directed by coumadin clinic What changed:  how much to take  how to take this  when to take this  additional instructions       Contact information for follow-up providers    McGowen, Adrian Blackwater, MD. Schedule an appointment as soon as possible for a visit in 3 day(s).   Specialty:  Family Medicine Contact information: [address] [place] [address] [phone]        Hemodialysis Center Follow up.   Why:  Continue regular dialysis appointments on Tuesdays, Thursdays and Saturdays.       [facility] Follow up.   Why:  They will do your home health care at your home Contact information: [address] [place] [address] [phone]        Lorretta Harp,  MD. Schedule an appointment as soon as possible for a visit.   Specialties:  Cardiology, Radiology Contact information: [address] [place] [address] [phone]            Contact information for after-discharge care    Destination    [facility] .   Specialty:  Skilled Nursing Facility Contact information: [address] [place] [phone]                 Allergies  Allergen Reactions  . Clonidine Derivatives Palpitations and Other (See Comments)    Very sedated  . Codeine Nausea  Only  . Nickel Rash  . Sulfa Antibiotics Other (See Comments)    Reaction unknown      Procedures/Studies: Dg Chest 2 View  Result Date: 01/16/2017 CLINICAL DATA:  Chronic 7+ month history of shortness of breath. Follow-up CHF and pleural effusions. Current history of COPD, hypertension and CHF. EXAM: CHEST  2 VIEW COMPARISON:  01/15/2017, 01/13/2017 and dating back to 01/28/2016. FINDINGS: AP semi-erect and lateral images were obtained. Cardiac silhouette markedly enlarged, unchanged. Left subclavian dual lead transvenous pacemaker unchanged and intact. Thoracic aorta atherosclerotic, unchanged. Hilar and mediastinal contours otherwise unremarkable. Interval resolution of interstitial pulmonary edema since yesterday. Stable bilateral pleural effusions, left greater than right and associated consolidation in the left lower lobe. No new pulmonary parenchymal abnormalities. Prior augmentation of a mid thoracic vertebral body. IMPRESSION: 1. Resolution of interstitial pulmonary edema since yesterday. 2. Stable bilateral pleural effusions, left greater than right, with associated passive atelectasis in the left lower lobe. 3. No new abnormalities. Electronically Signed   By: Evangeline Dakin M.D.   On: 01/16/2017 13:54   Dg Chest 2 View  Result Date: 01/13/2017 CLINICAL DATA:  Recent fall with right lower rib fractures, respiratory  distress with shortness of breath. Fatigue. Low oxygen saturation. EXAM: CHEST  2 VIEW COMPARISON:  Chest x-ray of January 10, 2017 FINDINGS: The lungs are adequately inflated. The interstitial markings remain increased. There is persistent left retrocardiac density. The pulmonary vascularity is engorged. The cardiac silhouette is enlarged. There is calcification in the wall of the thoracic aorta. The ICD is in stable position. The patient has undergone previous kyphoplasty of T9. IMPRESSION: CHF with interstitial edema. Persistent density in the retrocardiac region may reflect atelectasis or pneumonia. Electronically Signed   By: David  Martinique M.D.   On: 01/13/2017 10:03   Dg Ribs Unilateral W/chest Right  Result Date: 01/10/2017 CLINICAL DATA:  Recent fall with right chest pain, initial encounter EXAM: RIGHT RIBS AND CHEST - 3+ VIEW COMPARISON:  12/14/2016 FINDINGS: Cardiac shadow is mildly enlarged. Aortic calcifications are again seen. Chronic changes in the left base are seen but improved. Changes of prior vertebral augmentation are noted. Old rib fractures are noted on the right. Undisplaced right ninth rib fracture is noted. No other focal bony abnormality is seen. IMPRESSION: Undisplaced right ninth rib fracture without complicating factors. Multiple old right rib fractures with healing. Electronically Signed   By: Inez Catalina M.D.   On: 01/10/2017 21:19   Dg Chest Port 1 View  Result Date: 01/29/2017 CLINICAL DATA:  Status post dialysis catheter placement. EXAM: PORTABLE CHEST 1 VIEW COMPARISON:  Radiograph of January 20, 2017. FINDINGS: Stable cardiomegaly. Atherosclerosis of thoracic aorta is noted. Mild central pulmonary vascular congestion is noted. No pneumothorax is noted. Left-sided pacemaker is unchanged in position. Interval placement of right internal jugular dialysis catheter with distal tip in expected position of right atrium. Mild diffuse interstitial densities are noted concerning  for pulmonary edema. Mild left pleural effusion is noted with associated atelectasis. Bony thorax is unremarkable. IMPRESSION: Aortic atherosclerosis. Stable cardiomegaly with mild central pulmonary vascular congestion and bilateral pulmonary edema. Mild left pleural effusion is noted with associated atelectasis. Interval placement of right internal jugular dialysis catheter with distal tip in expected position of right atrium. Electronically Signed   By: Marijo Conception, M.D.   On: 01/29/2017 11:07   Dg Chest Port 1 View  Result Date: 01/20/2017 CLINICAL DATA:  CHF, history of previous CVA, stage IV chronic renal insufficiency, CHF, nonsmoker.  EXAM: PORTABLE CHEST 1 VIEW COMPARISON:  PA and lateral chest x-ray of January 16, 2017 FINDINGS: The lungs are reasonably well inflated. The interstitial markings are increased and the pulmonary vascularity is engorged. The cardiac silhouette remains enlarged. The retrocardiac region is slightly more dense today. There is calcification in the wall of the aortic arch. The ICD is in stable position. The patient has undergone kyphoplasty of approximately T8 in the past. There are old right posterior rib fractures. IMPRESSION: CHF with mild interstitial edema. Left lower lobe atelectasis and probable left pleural effusion. The ICD is in stable position. Thoracic aortic atherosclerosis. Electronically Signed   By: David  Martinique M.D.   On: 01/20/2017 13:30   Dg Chest Port 1 View  Result Date: 01/15/2017 CLINICAL DATA:  Hypoxia. EXAM: PORTABLE CHEST 1 VIEW COMPARISON:  01/13/2017 FINDINGS: Left chest wall pacer device is noted with lead in the right atrial appendage and right ventricle. There is cardiac enlargement and aortic atherosclerosis. Bilateral pleural effusions are noted left greater than right. Mild to moderate pulmonary edema. IMPRESSION: 1. Mild to moderate congestive heart failure. 2.  Aortic Atherosclerosis (ICD10-I70.0). Electronically Signed   By: Kerby Moors M.D.   On: 01/15/2017 08:19   Dg Fluoro Guide Cv Line-no Report  Result Date: 01/29/2017 Fluoroscopy was utilized by the requesting physician.  No radiographic interpretation.      Subjective: Patient denies complaints. No pain reported. Denies dyspnea, dizziness or lightheadedness. States that she does not like hospital food and will be able to eat better when she gets home with home food cooked by her daughter. She is excited to go home. Denies any other complaints. As per RN, no acute issues reported.  Discharge Exam:  Vitals:   02/02/17 1947 02/02/17 2229 02/03/17 0646 02/03/17 1109  BP: (!) 119/48 (!) 103/42 (!) 129/56 (!) 138/58  Pulse: 62  (!) 59   Resp: 20  20   Temp: 98 F (36.7 C)  98.6 F (37 C)   TempSrc: Oral  Oral   SpO2: 100%  100% 100%  Weight:   45.3 kg (99 lb 12.8 oz)   Height:        Constitutional: . Frail, chronically ill appearing, fully alert, oriented x3, Sitting up comfortably in chair this morning. CVS:S1 and S2 heard, RRR. No JVD or murmurs. No pedal edema. . Pulmonary: Clear to auscultation. No increased work of breathing.  Abdominal: nondistended, soft and nontender. Normal bowel sounds heard.  Musculoskeletal: Symmetrically moves all extremities. No pedal edema. Old/healing area of ecchymosis over left mid lateral calf.  Neuro:  alert and oriented 3: No focal neurological deficits.  Skin: Skin is warm and dry.  Psychiatric: Normal mood and affect.     The results of significant diagnostics from this hospitalization (including imaging, microbiology, ancillary and laboratory) are listed below for reference.     Microbiology: Recent Results (from the past 240 hour(s))  Surgical PCR screen     Status: None   Collection Time: 01/29/17 12:43 AM  Result Value Ref Range Status   MRSA, PCR NEGATIVE NEGATIVE Final   Staphylococcus aureus NEGATIVE NEGATIVE Final    Comment: (NOTE) The Xpert SA Assay (FDA approved for NASAL specimens in  patients 87 years of age and older), is one component of a comprehensive surveillance program. It is not intended to diagnose infection nor to guide or monitor treatment.      Labs: CBC:  Recent Labs Lab 01/29/17 0541 01/30/17 0610 02/01/17 0733  WBC  7.6 8.4 9.2  HGB 8.8* 8.6* 8.7*  HCT 29.7* 29.4* 29.3*  MCV 97.4 99.7 97.7  PLT 216 191 633   Basic Metabolic Panel:  Recent Labs Lab 01/28/17 0524 01/29/17 0541 01/30/17 0610 01/31/17 1832 02/01/17 0733  NA 136 137 138  --  134*  K 3.4* 3.6 4.2  --  3.2*  CL 94* 93* 101  --  98*  CO2 35* 35* 29  --  28  GLUCOSE 90 89 108*  --  91  BUN 52* 48* 31*  --  8  CREATININE 1.90* 1.89* 1.64*  --  1.51*  CALCIUM 8.8* 8.7* 8.7*  --  8.7*  PHOS  --   --   --  1.2* 2.4*   Liver Function Tests:  Recent Labs Lab 01/29/17 1437 02/01/17 0733  ALT 13*  --   ALBUMIN  --  3.0*   BNP (last 3 results)  Recent Labs  11/30/16 1331 12/14/16 2026 01/10/17 2203  BNP >4,500.0* 919.4* 1,996.8*   Urinalysis    Component Value Date/Time   COLORURINE YELLOW 01/20/2017 [place] 01/20/2017 1737   LABSPEC 1.012 01/20/2017 1737   PHURINE 5.0 01/20/2017 1737   GLUCOSEU NEGATIVE 01/20/2017 1737   HGBUR NEGATIVE 01/20/2017 1737   BILIRUBINUR NEGATIVE 01/20/2017 1737   BILIRUBINUR negative 09/24/2016 [place] 01/20/2017 1737   PROTEINUR 30 (A) 01/20/2017 1737   UROBILINOGEN 0.2 09/24/2016 1453   NITRITE NEGATIVE 01/20/2017 1737   LEUKOCYTESUR NEGATIVE 01/20/2017 1737      Time coordinating discharge: Over 30 minutes  SIGNED:  Vernell Leep, MD, FACP, FHM. [facility] Pager [phone]  If 7PM-7AM, please contact night-coverage [email or web address] Password [facility] 02/03/2017, 3:34 PM

## 2017-02-03 NOTE — Telephone Encounter (Signed)
New message       Not sure if this is a TOC. However, since nurse scheduled it for 7-10 days, I am sending it as a TCM.  Appt made with Kerin Ransom on 02-11-17 at [place] from a nurse named Elmyra Ricks.

## 2017-02-04 DIAGNOSIS — D689 Coagulation defect, unspecified: Secondary | ICD-10-CM | POA: Diagnosis not present

## 2017-02-04 DIAGNOSIS — D631 Anemia in chronic kidney disease: Secondary | ICD-10-CM | POA: Diagnosis not present

## 2017-02-04 DIAGNOSIS — I4891 Unspecified atrial fibrillation: Secondary | ICD-10-CM | POA: Diagnosis not present

## 2017-02-04 DIAGNOSIS — N186 End stage renal disease: Secondary | ICD-10-CM | POA: Diagnosis not present

## 2017-02-04 DIAGNOSIS — E876 Hypokalemia: Secondary | ICD-10-CM | POA: Diagnosis not present

## 2017-02-04 LAB — PROTIME-INR: INR: 3 — AB (ref <=1.1)

## 2017-02-04 NOTE — Telephone Encounter (Signed)
Patient contacted regarding discharge from [facility] on 02/03/17.  Patient understands to follow up with provider Kerin Ransom PA on 02/11/17 at 11:00am at [facility]. Patient understands discharge instructions? Yes Patient understands medications and regimen? Yes Patient understands to bring all medications to this visit? Yes

## 2017-02-05 ENCOUNTER — Ambulatory Visit (INDEPENDENT_AMBULATORY_CARE_PROVIDER_SITE_OTHER): Payer: Medicare Other | Admitting: Family Medicine

## 2017-02-05 ENCOUNTER — Encounter: Payer: Self-pay | Admitting: Family Medicine

## 2017-02-05 ENCOUNTER — Telehealth: Payer: Self-pay | Admitting: *Deleted

## 2017-02-05 ENCOUNTER — Other Ambulatory Visit: Payer: Self-pay

## 2017-02-05 ENCOUNTER — Ambulatory Visit (INDEPENDENT_AMBULATORY_CARE_PROVIDER_SITE_OTHER): Payer: Medicare Other | Admitting: Pharmacist

## 2017-02-05 VITALS — BP 96/52 | HR 66 | Temp 97.6°F | Resp 16 | Ht 59.0 in | Wt 101.0 lb

## 2017-02-05 DIAGNOSIS — Z9981 Dependence on supplemental oxygen: Secondary | ICD-10-CM | POA: Diagnosis not present

## 2017-02-05 DIAGNOSIS — Z79891 Long term (current) use of opiate analgesic: Secondary | ICD-10-CM | POA: Diagnosis not present

## 2017-02-05 DIAGNOSIS — I10 Essential (primary) hypertension: Secondary | ICD-10-CM

## 2017-02-05 DIAGNOSIS — D638 Anemia in other chronic diseases classified elsewhere: Secondary | ICD-10-CM | POA: Diagnosis not present

## 2017-02-05 DIAGNOSIS — I9589 Other hypotension: Secondary | ICD-10-CM

## 2017-02-05 DIAGNOSIS — I130 Hypertensive heart and chronic kidney disease with heart failure and stage 1 through stage 4 chronic kidney disease, or unspecified chronic kidney disease: Secondary | ICD-10-CM | POA: Diagnosis not present

## 2017-02-05 DIAGNOSIS — J9611 Chronic respiratory failure with hypoxia: Secondary | ICD-10-CM

## 2017-02-05 DIAGNOSIS — Z7901 Long term (current) use of anticoagulants: Secondary | ICD-10-CM

## 2017-02-05 DIAGNOSIS — I480 Paroxysmal atrial fibrillation: Secondary | ICD-10-CM

## 2017-02-05 DIAGNOSIS — I5042 Chronic combined systolic (congestive) and diastolic (congestive) heart failure: Secondary | ICD-10-CM | POA: Diagnosis not present

## 2017-02-05 DIAGNOSIS — I4891 Unspecified atrial fibrillation: Secondary | ICD-10-CM

## 2017-02-05 DIAGNOSIS — E785 Hyperlipidemia, unspecified: Secondary | ICD-10-CM | POA: Diagnosis not present

## 2017-02-05 DIAGNOSIS — Z789 Other specified health status: Secondary | ICD-10-CM

## 2017-02-05 DIAGNOSIS — Z992 Dependence on renal dialysis: Secondary | ICD-10-CM | POA: Diagnosis not present

## 2017-02-05 DIAGNOSIS — N186 End stage renal disease: Secondary | ICD-10-CM | POA: Diagnosis not present

## 2017-02-05 DIAGNOSIS — R5381 Other malaise: Secondary | ICD-10-CM

## 2017-02-05 DIAGNOSIS — N183 Chronic kidney disease, stage 3 (moderate): Secondary | ICD-10-CM | POA: Diagnosis not present

## 2017-02-05 DIAGNOSIS — Z950 Presence of cardiac pacemaker: Secondary | ICD-10-CM | POA: Diagnosis not present

## 2017-02-05 DIAGNOSIS — Z5181 Encounter for therapeutic drug level monitoring: Secondary | ICD-10-CM | POA: Diagnosis not present

## 2017-02-05 DIAGNOSIS — I739 Peripheral vascular disease, unspecified: Secondary | ICD-10-CM | POA: Diagnosis not present

## 2017-02-05 DIAGNOSIS — I255 Ischemic cardiomyopathy: Secondary | ICD-10-CM | POA: Diagnosis not present

## 2017-02-05 NOTE — Patient Instructions (Signed)
Stop hydralazine.  Call your [facility] nurse or [place] if your blood pressure is <90 over 45, or if > 160/100. Also, if bp is <110 over 60, do not give her blood pressure meds (amlodipine and coreg).

## 2017-02-05 NOTE — Progress Notes (Signed)
02/05/2017  CC:  Chief Complaint  Patient presents with  . Hospitalization Follow-up    TCM    Patient is a 83 y.o. Caucasian female who presents accompanied by her daughter for hospital follow up, specifically [facility] face-to-face visit. Dates hospitalized: 8/26-9/19, 2018. Days since d/c from hospital: 2 Patient was discharged from hospital to home.  Pt and daughter decided against rehab. Reason for admission to hospital: acute on chronic hypoxic resp failure. Date of interactive (phone) contact with patient and/or caregiver:02/03/17  I have reviewed patient's discharge summary plus pertinent specific notes, labs, and imaging from the hospitalization.   Pt with chronic systolic and diastolic CHF, known CKD stage III w/ solitary functional right kidney, admitted after an episodes of fall and was found to have CHF and rising Cr.  Home oxygen 3-4L at baseline.  High diuretic dosing still did not lead to adequate diuresis/urination, so pt was started on hemodialysis.  She had a 9th right rib fracture, nondisplaced--from her fall.  No other injury. She was anxious and depressed and started on antidepressant in hosp but had adverse rxn and states she doesn't need this kind of medication..  She has mild malnutrition and FTT related to her chronic comorbid medical conditions.   On top of this, she has a-fib and is on coumadin anticoagulation.  "Just very tired".  No orthostatic dizziness.  No CP.  Baseline resp status, mild SOB at rest occasionally but minimal activity worsens her SOB.  No desaturations since d/c from hospital.   Essentially, pt can only get out of WC and transfer, occ take a few steps but it is VERY difficult for her. When she does take a few steps, she requires a walker AND a person to assist her.  She requests a pediatric WC rx today. Daughter is taking care of her at her home, has advanced HH in home assistance set up. She is now on scheduled hemodialysis:  T/Th/Sat.  Has right IJ cath in place.  There has been some discussion among her specialists about AV fistula for permanent access, but apparently her veins are not ready/too brittle per pt report.  No discussion of graft. HD makes her feel horrible.  Often has low bp, occ has had to stop it early due to low bp, and on one occasion had to get some IVF after HD.  She says she urinates approx 50-100 cc's once per 24h.  Has no rib pain.  Has waxing/waning pain in spinal area at mid/low back level--suspected due to kyphotic posture. X-rays in hosp documented no acute compression fracture from her fall, but there was a R ninth rib fracture.  Medication reconciliation was done today and patient is taking meds as recommended by discharging hospitalist/specialist.   She was taken off her furosemide, ibandronate, and potassium.  Today she held her hydralazine due to low bp.  PMH:  Past Medical History:  Diagnosis Date  . Atrophy of left kidney    with absent blood flow by renal artery dopplers (Dr. Gwenlyn Found)  . 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 Chronic combined systolic and diastolic CHF (congestive heart failure) ([place])    a. 11/2016: echo showing EF of 35-40%, RV strain noted, mild MR and mild TR.   [name] Chronic renal insufficiency, stage 4 (severe) (HCC)    Baseline GFR 25 ml/min-- renal u/s showed atrophic/hypoplastic left kidney.  No hydronephrosis.  Small right renal cyst.  . History of adenomatous polyp of colon   .  History of subarachnoid hemorrhage 10/2014   after syncope and while on xarelto  . Hyperlipidemia   . Hypertension    Difficult to control, in the setting of one functioning kidney: pt was referred to nephrology by Dr. Gwenlyn Found 06/2015.  . Lumbar radiculopathy 2012  . Lumbar spondylosis    MR 07/2016---no sign of spinal nerve compression or cord compression.  Pt set up with outpt ortho while admitted to hosp 07/2016.  . Metatarsal fracture 06/10/2016   Nondisplaced, left  5th metatarsal--pt was referred to ortho  . Nonischemic cardiomyopathy ([place])   . Osteopenia 2014   T-score -2.1  . PAF (paroxysmal atrial fibrillation) (HCC)    Eliquis started after BRAO and CVA.  She was changed to warfarin 2018.  . Pulmonary edema 11/2016  . Stroke [facility])    cardioembolic (had CVA while on no anticoag)--"scattered subacute punctate infarcts: 1 in R parietal lobe and 2 in occipital cortex" on MRI br.  CT angio head/neck: aortic arch athero.   . Thoracic compression fracture ([place]) 07/2016   T7 and T8-- T8 kyphoplasty during hosp admission 07/2016.  Neuro referred pt to pain mgmt for consideration of injection 09/2016.  I referred her to endo 08/2016 for consideration of calcitonin treatment.    PSH:  Past Surgical History:  Procedure Laterality Date  . APPENDECTOMY  child  . CARDIOVASCULAR STRESS TEST  01/31/2016   Stress myoview: NORMAL/Low risk.  EF 56%.  . [place]  . COLONOSCOPY  2015   + hx of adenomatous polyps.  Need digest health spec in [facility] records to see when pt due for next colonoscopy  . INSERTION OF DIALYSIS CATHETER Right 01/29/2017   Procedure: INSERTION OF DIALYSIS CATHETER- RIGHT INTERNAL JUGULAR;  Surgeon: [name], MD;  Location: [facility] OR;  Service: Vascular;  Laterality: Right;  . IR GENERIC HISTORICAL  07/24/2016   IR KYPHO THORACIC WITH BONE BIOPSY 07/24/2016 Luanne Bras, MD [facility]-INTERV RAD  . KYPHOPLASTY  07/27/2016   T8  . PACEMAKER IMPLANT N/A 12/03/2016   Procedure: Pacemaker Implant;  Surgeon: Evans Lance, MD;  Location: [facility] LAB;  Service: Cardiovascular;  Laterality: N/A;  . Renal artery dopplers  02/26/2016   Her right renal dimension was 11 cm pole to pole with mild to moderate right renal artery stenosis. A right renal aortic ratio was 3.22 suggesting less than a 50% stenosis.  . TONSILLECTOMY    . TRANSTHORACIC ECHOCARDIOGRAM  01/29/2016; 11/2016   2017: EF 55-60%, normal LV wall motion, grade I DD.  No  cardiac source of emboli was seen.  2018: EF 35-40%, could not assess DD due to a-fib, pulm HTN noted.    MEDS:  Outpatient Medications Prior to Visit  Medication Sig Dispense Refill  . acetaminophen (TYLENOL) 500 MG tablet Take 500 mg by mouth every 6 (six) hours as needed for moderate pain.    . Calcium Carb-Cholecalciferol (CALCIUM/VITAMIN D PO) Take 1 tablet by mouth 2 (two) times daily.    . carvedilol (COREG) 25 MG tablet Take 1 tablet (25 mg total) by mouth 2 (two) times daily with a meal. 60 tablet 0  . hydrALAZINE (APRESOLINE) 100 MG tablet Take 1 tablet (100 mg total) by mouth 3 (three) times daily. 30 tablet 0  . isosorbide mononitrate (IMDUR) 60 MG 24 hr tablet Take 1 tablet (60 mg total) by mouth daily. 30 tablet 0  . Multiple Vitamin (MULTIVITAMIN) tablet Take 1 tablet by mouth 3 (three) times a  week.     . ondansetron (ZOFRAN) 4 MG tablet Take 0.5-1 tablets (2-4 mg total) by mouth every 8 (eight) hours as needed for nausea or vomiting. 90 tablet 3  . oxyCODONE-acetaminophen (PERCOCET/ROXICET) 5-325 MG tablet Take 1-2 tablets by mouth every 6 (six) hours as needed for severe pain. 60 tablet 0  . pravastatin (PRAVACHOL) 40 MG tablet Take 40 mg by mouth every evening.    . vitamin C (ASCORBIC ACID) 500 MG tablet Take 500 mg by mouth daily.    . Vitamin D, Ergocalciferol, (DRISDOL) 50000 units CAPS capsule 1 cap po q week x 12 weeks (Patient taking differently: Take 50,000 Units by mouth every 7 (seven) days. For 12 weeks. (Thursdays)) 12 capsule 1  . warfarin (COUMADIN) 3 MG tablet Take 1/2 to 1 tablet by mouth daily as directed by coumadin clinic (Patient taking differently: Take 1.5-3 mg by mouth See admin instructions. Take 1/2 tablet on Monday, Wednesday and Friday then take 1 tablet all the other days) 30 tablet 1  . amiodarone (PACERONE) 200 MG tablet Take 1 tablet (200 mg total) by mouth daily. 30 tablet 0  . amLODipine (NORVASC) 5 MG tablet Take 1 tablet (5 mg total) by mouth  daily. 30 tablet 3  . folic acid (FOLVITE) 1 MG tablet Take 1 tablet (1 mg total) by mouth daily. (Patient not taking: Reported on 02/05/2017) 30 tablet 0   No facility-administered medications prior to visit.    EXAM: BP (!) 96/52 (BP Location: Right Arm, Patient Position: Sitting, Cuff Size: Small)   Pulse 66   Temp 97.6 F (36.4 C) (Oral)   Resp 16   Ht 4\' 11"  (1.499 m)   Wt 101 lb (45.8 kg)   SpO2 94% Comment: 4L O2  BMI 20.40 kg/m  4L oxygen [place]: alert, appears tired and frail/chronically ill but is in NAD.  Oriented x 4. Affect: pleasant but this takes effort.  Lucid thought and speech. GMW:NUUV: no injection, icteris, swelling, or exudate.  EOMI, PERRLA. Mouth: lips without lesion/swelling.  Oral mucosa pink and moist. Oropharynx without erythema, exudate, or swelling.  CV: RRR, no m/r/g.   LUNGS: CTA bilat, nonlabored resps, good aeration in all lung fields. ABD: soft, NT/ND EXT: no clubbing, cyanosis, or edema.   Pertinent labs/imaging Lab Results  Component Value Date   TSH 3.563 12/15/2016   Lab Results  Component Value Date   WBC 9.2 02/01/2017   HGB 8.7 (L) 02/01/2017   HCT 29.3 (L) 02/01/2017   MCV 97.7 02/01/2017   PLT 165 02/01/2017   Lab Results  Component Value Date   IRON 95 01/14/2017   TIBC 214 (L) 01/14/2017   FERRITIN 397 (H) 01/14/2017    Lab Results  Component Value Date   CREATININE 1.51 (H) 02/01/2017   BUN 8 02/01/2017   NA 134 (L) 02/01/2017   K 3.2 (L) 02/01/2017   CL 98 (L) 02/01/2017   CO2 28 02/01/2017   Lab Results  Component Value Date   ALT 13 (L) 01/29/2017   AST 22 01/21/2017   ALKPHOS 64 01/21/2017   BILITOT 0.7 01/21/2017   Lab Results  Component Value Date   CHOL 140 06/17/2016   Lab Results  Component Value Date   HDL 39 (L) 06/17/2016   Lab Results  Component Value Date   LDLCALC 72 06/17/2016   Lab Results  Component Value Date   TRIG 146 06/17/2016   Lab Results  Component Value Date  CHOLHDL  3.6 06/17/2016   Lab Results  Component Value Date   INR 2.44 02/03/2017   INR 2.45 02/02/2017   INR 2.25 02/01/2017    ASSESSMENT/PLAN:  1) New ESRD requiring HD--she'll continue T/Th/Sat schedule as per nephrology.  2) Low blood pressure (in pt with HTN on 3 antihypertensive meds): related to fluid removal from HD. She is now going to need less bp med---we'll stop her hydralazine.  Continue coreg and amlodipine at this time. Monitor bp at home: Call your [facility] nurse or [place] if your blood pressure is <90 over 45, or if > 160/100. Also, if bp is <110 over 60, do not give her blood pressure meds (amlodipine and coreg).  3) Combined syst/diast CHF: no sign of volume overload.  O2 sats good on her usual 4L oxygen [employer]. Continue coreg as long as bp allows.  4) A-fib: current rhythm is regular.  Continue amiodarone, coreg, and coumadin. PT/INR monitoring/mgmt to be done at HD center.  5) Anemia of chronic dz: Hb has been stable in 8.5-8.8 range.  6) Chronic debilitation: she refuses rehab/SNF placement. Continue HH assistance.  I wrote rx for pediatric WC today.  Medical decision making of high complexity was utilized today.  An After Visit Summary was printed and given to the patient.  FOLLOW UP:  1 mo. She has cardiology f/u next week.  Has nephrology f/u 02/17/17.  Signed:  Crissie Sickles, MD           02/05/2017

## 2017-02-05 NOTE — Telephone Encounter (Signed)
Order/Rx faxed.  Sharion Balloon advised and voiced understanding.

## 2017-02-05 NOTE — Telephone Encounter (Addendum)
Pt has apt today at 11:00am for TCM-HFU.  Lamar Blinks on 02/05/17 at 10:10am wanting to confirm [facility] orders for pt and also let Dr. Anitra Lauth know that pts BP was 86/42 when she checked it this morning.   I SW [place] and she stated they are planing on continuing the same orders as before: monitoring pt of heart failure, pulse ox below 85%, and BP checks. She wants to know if there are any other orders Dr. Anitra Lauth would like to add?  She also stated that pt has requested order for pediatric wheel chair. She will need this order faxed to [facility] at [phone].  She stated that they were checking pts PT/INR but pt is now on dialysis so her PT/INR is being checked when she goes for dialysis.

## 2017-02-05 NOTE — Patient Outreach (Signed)
[facility] [facility]) Care Management  02/05/2017  Valerie Terrell Apr 26, 1933 [id number]   Subjective: none  Objective: none  Assessment:  83 year old with admitted 8/27-9/19 for acute on chronic respiratory failure with hypoxia. Other admissions:  7/16-7/23 with respiratory failure due to pulmonary edema due to volume overload from acute kidney failure; Pacer placed 7/19 due to tachy brady syndrome. Client readmitted 7/31-8/2 with heart failure, atrial fibrillation, hypertension.   RNCM called for transition of care. No answer. HIPPA compliant message left.  Plan: Follow up transition of care call next business day.  Thea Silversmith, RN, MSN, [place] Coordinator Cell: [phone]

## 2017-02-05 NOTE — Telephone Encounter (Signed)
No additional/new orders. Wheelchair rx written and put on your desk.-thx

## 2017-02-06 DIAGNOSIS — I4891 Unspecified atrial fibrillation: Secondary | ICD-10-CM | POA: Diagnosis not present

## 2017-02-06 DIAGNOSIS — N186 End stage renal disease: Secondary | ICD-10-CM | POA: Diagnosis not present

## 2017-02-06 DIAGNOSIS — D631 Anemia in chronic kidney disease: Secondary | ICD-10-CM | POA: Diagnosis not present

## 2017-02-06 DIAGNOSIS — E876 Hypokalemia: Secondary | ICD-10-CM | POA: Diagnosis not present

## 2017-02-06 DIAGNOSIS — D689 Coagulation defect, unspecified: Secondary | ICD-10-CM | POA: Diagnosis not present

## 2017-02-06 LAB — PROTIME-INR: INR: 3 — AB (ref <=1.1)

## 2017-02-07 ENCOUNTER — Encounter: Payer: Self-pay | Admitting: Family Medicine

## 2017-02-07 DIAGNOSIS — I5041 Acute combined systolic (congestive) and diastolic (congestive) heart failure: Secondary | ICD-10-CM | POA: Diagnosis not present

## 2017-02-07 DIAGNOSIS — Z789 Other specified health status: Secondary | ICD-10-CM | POA: Insufficient documentation

## 2017-02-07 DIAGNOSIS — R5381 Other malaise: Secondary | ICD-10-CM | POA: Insufficient documentation

## 2017-02-07 DIAGNOSIS — R531 Weakness: Secondary | ICD-10-CM | POA: Insufficient documentation

## 2017-02-08 ENCOUNTER — Other Ambulatory Visit: Payer: Self-pay | Admitting: *Deleted

## 2017-02-08 ENCOUNTER — Other Ambulatory Visit: Payer: Self-pay

## 2017-02-08 ENCOUNTER — Other Ambulatory Visit: Payer: Self-pay | Admitting: Family Medicine

## 2017-02-08 ENCOUNTER — Ambulatory Visit (INDEPENDENT_AMBULATORY_CARE_PROVIDER_SITE_OTHER): Payer: Medicare Other | Admitting: Pharmacist Clinician (PhC)/ Clinical Pharmacy Specialist

## 2017-02-08 DIAGNOSIS — I4891 Unspecified atrial fibrillation: Secondary | ICD-10-CM

## 2017-02-08 DIAGNOSIS — Z5181 Encounter for therapeutic drug level monitoring: Secondary | ICD-10-CM | POA: Diagnosis not present

## 2017-02-08 DIAGNOSIS — Z7901 Long term (current) use of anticoagulants: Secondary | ICD-10-CM

## 2017-02-08 MED ORDER — CARVEDILOL 25 MG PO TABS: 25.0000 mg | ORAL_TABLET | Freq: Two times a day (BID) | ORAL | 6 refills | Status: DC

## 2017-02-08 MED ORDER — ISOSORBIDE MONONITRATE ER 60 MG PO TB24: 60.0000 mg | ORAL_TABLET | Freq: Every day | ORAL | 6 refills | Status: DC

## 2017-02-08 NOTE — Telephone Encounter (Signed)
Pts daughter LMOM on 02/08/17 at 8:41am requesting refill for Imdur and Coreg. She stated that these medications were started in the hospital.

## 2017-02-08 NOTE — Patient Outreach (Signed)
[place] [facility]) Care Management  02/08/2017  Valerie Terrell [birth year] [id number]   Subjective: none  Objective: none  Assessment: 83 year old with admitted 8/27-9/19 for acute on chronic respiratory failure with hypoxia. Other admissions:  7/16-7/23 with respiratory failure due to pulmonary edema due to volume overload from acute kidney failure; Pacer placed 7/19 due to tachy brady syndrome. Client readmitted 7/31-8/2 with heart failure, atrial fibrillation, hypertension.   RNCM called for transition of care. No answer. HIPPA compliant message left. 2nd attempt.  Plan: Follow up transition of care call tomorrow.  Thea Silversmith, RN, MSN, [place] Coordinator Cell: [phone]

## 2017-02-08 NOTE — Telephone Encounter (Signed)
Rx's faxed.

## 2017-02-08 NOTE — Telephone Encounter (Signed)
Pts daughter advised and voiced understanding, okay per DPR.

## 2017-02-09 ENCOUNTER — Other Ambulatory Visit: Payer: Self-pay

## 2017-02-09 DIAGNOSIS — D631 Anemia in chronic kidney disease: Secondary | ICD-10-CM | POA: Diagnosis not present

## 2017-02-09 DIAGNOSIS — I4891 Unspecified atrial fibrillation: Secondary | ICD-10-CM | POA: Diagnosis not present

## 2017-02-09 DIAGNOSIS — N186 End stage renal disease: Secondary | ICD-10-CM | POA: Diagnosis not present

## 2017-02-09 DIAGNOSIS — D689 Coagulation defect, unspecified: Secondary | ICD-10-CM | POA: Diagnosis not present

## 2017-02-09 DIAGNOSIS — E876 Hypokalemia: Secondary | ICD-10-CM | POA: Diagnosis not present

## 2017-02-09 NOTE — Patient Outreach (Signed)
[place] [facility]) Care Management  02/09/2017  Valerie Terrell 08/18/1932 [id number]  Subjective: none  Objective: none  Assessment: 83 year old with admitted 8/27-9/19 for acute on chronic respiratory failure with hypoxia. Other admissions:7/16-7/23 with respiratory failure due to pulmonary edema due to volume overload from acute kidney failure; Pacer placed 7/19 due to tachy brady syndrome. Client readmitted 7/31-8/2 with heart failure, atrial fibrillation, hypertension.   RNCM called for transition of care. Also received notification of EMMI red flag-new/worsening problem. No answer. HIPPA compliant message left.  3rd attempt.  Plan: RNCM will send outreach letter.  Thea Silversmith, RN, MSN, [place] Coordinator Cell: [phone]

## 2017-02-10 DIAGNOSIS — Z9981 Dependence on supplemental oxygen: Secondary | ICD-10-CM | POA: Diagnosis not present

## 2017-02-10 DIAGNOSIS — E785 Hyperlipidemia, unspecified: Secondary | ICD-10-CM | POA: Diagnosis not present

## 2017-02-10 DIAGNOSIS — D638 Anemia in other chronic diseases classified elsewhere: Secondary | ICD-10-CM | POA: Diagnosis not present

## 2017-02-10 DIAGNOSIS — I5043 Acute on chronic combined systolic (congestive) and diastolic (congestive) heart failure: Secondary | ICD-10-CM | POA: Diagnosis not present

## 2017-02-10 DIAGNOSIS — Z79891 Long term (current) use of opiate analgesic: Secondary | ICD-10-CM | POA: Diagnosis not present

## 2017-02-10 DIAGNOSIS — I255 Ischemic cardiomyopathy: Secondary | ICD-10-CM | POA: Diagnosis not present

## 2017-02-10 DIAGNOSIS — I739 Peripheral vascular disease, unspecified: Secondary | ICD-10-CM | POA: Diagnosis not present

## 2017-02-10 DIAGNOSIS — I4891 Unspecified atrial fibrillation: Secondary | ICD-10-CM | POA: Diagnosis not present

## 2017-02-10 DIAGNOSIS — Z5181 Encounter for therapeutic drug level monitoring: Secondary | ICD-10-CM | POA: Diagnosis not present

## 2017-02-10 DIAGNOSIS — Z950 Presence of cardiac pacemaker: Secondary | ICD-10-CM | POA: Diagnosis not present

## 2017-02-10 DIAGNOSIS — I130 Hypertensive heart and chronic kidney disease with heart failure and stage 1 through stage 4 chronic kidney disease, or unspecified chronic kidney disease: Secondary | ICD-10-CM | POA: Diagnosis not present

## 2017-02-10 DIAGNOSIS — N183 Chronic kidney disease, stage 3 (moderate): Secondary | ICD-10-CM | POA: Diagnosis not present

## 2017-02-11 ENCOUNTER — Telehealth: Payer: Self-pay | Admitting: *Deleted

## 2017-02-11 ENCOUNTER — Encounter: Payer: Self-pay | Admitting: Cardiology

## 2017-02-11 ENCOUNTER — Inpatient Hospital Stay: Payer: Self-pay | Admitting: Family Medicine

## 2017-02-11 ENCOUNTER — Ambulatory Visit (INDEPENDENT_AMBULATORY_CARE_PROVIDER_SITE_OTHER): Payer: Medicare Other | Admitting: Cardiology

## 2017-02-11 VITALS — BP 158/64 | HR 60 | Ht 59.0 in | Wt 110.8 lb

## 2017-02-11 DIAGNOSIS — Z7901 Long term (current) use of anticoagulants: Secondary | ICD-10-CM | POA: Diagnosis not present

## 2017-02-11 DIAGNOSIS — J9611 Chronic respiratory failure with hypoxia: Secondary | ICD-10-CM

## 2017-02-11 DIAGNOSIS — E876 Hypokalemia: Secondary | ICD-10-CM | POA: Diagnosis not present

## 2017-02-11 DIAGNOSIS — I428 Other cardiomyopathies: Secondary | ICD-10-CM | POA: Diagnosis not present

## 2017-02-11 DIAGNOSIS — Z992 Dependence on renal dialysis: Secondary | ICD-10-CM

## 2017-02-11 DIAGNOSIS — M546 Pain in thoracic spine: Secondary | ICD-10-CM

## 2017-02-11 DIAGNOSIS — Z8673 Personal history of transient ischemic attack (TIA), and cerebral infarction without residual deficits: Secondary | ICD-10-CM | POA: Diagnosis not present

## 2017-02-11 DIAGNOSIS — Z905 Acquired absence of kidney: Secondary | ICD-10-CM | POA: Diagnosis not present

## 2017-02-11 DIAGNOSIS — I5042 Chronic combined systolic (congestive) and diastolic (congestive) heart failure: Secondary | ICD-10-CM

## 2017-02-11 DIAGNOSIS — M545 Low back pain: Principal | ICD-10-CM

## 2017-02-11 DIAGNOSIS — G8929 Other chronic pain: Secondary | ICD-10-CM

## 2017-02-11 DIAGNOSIS — Z8781 Personal history of (healed) traumatic fracture: Secondary | ICD-10-CM

## 2017-02-11 DIAGNOSIS — I4891 Unspecified atrial fibrillation: Secondary | ICD-10-CM | POA: Diagnosis not present

## 2017-02-11 DIAGNOSIS — N186 End stage renal disease: Secondary | ICD-10-CM

## 2017-02-11 DIAGNOSIS — D631 Anemia in chronic kidney disease: Secondary | ICD-10-CM | POA: Diagnosis not present

## 2017-02-11 DIAGNOSIS — I5041 Acute combined systolic (congestive) and diastolic (congestive) heart failure: Secondary | ICD-10-CM

## 2017-02-11 DIAGNOSIS — Z950 Presence of cardiac pacemaker: Secondary | ICD-10-CM

## 2017-02-11 DIAGNOSIS — D689 Coagulation defect, unspecified: Secondary | ICD-10-CM | POA: Diagnosis not present

## 2017-02-11 DIAGNOSIS — Z9889 Other specified postprocedural states: Secondary | ICD-10-CM

## 2017-02-11 DIAGNOSIS — I10 Essential (primary) hypertension: Secondary | ICD-10-CM

## 2017-02-11 HISTORY — DX: Chronic respiratory failure with hypoxia: J96.11

## 2017-02-11 NOTE — Assessment & Plan Note (Signed)
Embolic stroke (retinal artery occlusion and embolic bilateral micro emboli on MRI) 01/30/16

## 2017-02-11 NOTE — Assessment & Plan Note (Signed)
Now on chronic O2

## 2017-02-11 NOTE — Assessment & Plan Note (Signed)
Echo July 2018 shows EF 35-40% with no regional wall motion abnormality. Moderate concentric LVH. PA pressures up to 47 mmHg with RV EF decreased.

## 2017-02-11 NOTE — Telephone Encounter (Signed)
Referral ordered to Dr. Nelva Bush as per pt request.

## 2017-02-11 NOTE — Progress Notes (Signed)
02/11/2017 Valerie Terrell   1933-05-07  [id number]  Primary Physician McGowen, Adrian Blackwater, MD Primary Cardiologist: Dr Gwenlyn Found Dr Lovena Le  HPI:  83 y/o female with a history of PAF. She had been placed on Xarelto in the past but apparently had what sounds like orthostatic syncope and suffered a "tiny" Simpson in 2016 after a fall and Xarelto was discontinued and she as placed on ASA.. She presented 01/30/16 with a head ache and blurred vision in her OS. She had a retinal artery occlusion and evidence of bilateral micro emboli on MRI c/w embolic stroke. After consultation with the Neurologist she was placed on Eliquis 5 mg BID and later Dr Gwenlyn Found stopped her ASA. During thathospitalization she had also complained of chest pain and had a slightly elevated Troponin. Echo and Myoview were normal. She has had labile HTN. Hospital records from Sept 2017 showed a "hypoplastic Lt kidney" and we had her get renal artery dopplers as an OP. This revealed 1-59% Rt RAS and no flow in the Lt renal artery. This was reviewed by Dr Gwenlyn Found who felt that the Rt RA was not significantly stenosed. Her renal to aorta ratio was 3.22 with 3.5 being the cutoff for renal artery stenosis.   IN July 2018 she was admitted and had a Research officer, political party pacemaker placed. She was readmitted 7/30-12/17/16 with recurrent PAF that stabilized before discharged. She was re admitted 01/10/17 with CHF. Despite IV diuretics we were unable to diurese her significantly. She was seen in consult by the renal service (Dr Florene Glen). On 01/29/17 she had an IJ catheter placed and she started on HD. She goes T/Th/Sat.   She is in the office today with her daughter as a post hospital follow up. Her B/P had been running low and her hydralazine was stopped. Her INR is being checked at dialysis. From a cardiac standpoint she is stable. Her main complaint is upper back pain and night sweats. Neither of these complaints is new. She had kyphoplasty in March 2018 that helped but now  has recurrent pain, "can't get comfortable".  She admits she has had night sweats for some time but she thinks its worse since she started HD.    Current Outpatient Prescriptions  Medication Sig Dispense Refill  . acetaminophen (TYLENOL) 500 MG tablet Take 500 mg by mouth every 6 (six) hours as needed for moderate pain.    [name] amiodarone (PACERONE) 200 MG tablet Take 1 tablet (200 mg total) by mouth daily. 30 tablet 0  . amLODipine (NORVASC) 5 MG tablet Take 5 mg by mouth daily.    . Calcium Carb-Cholecalciferol (CALCIUM/VITAMIN D PO) Take 1 tablet by mouth 2 (two) times daily.    . carvedilol (COREG) 25 MG tablet Take 1 tablet (25 mg total) by mouth 2 (two) times daily with a meal. 60 tablet 6  . folic acid (FOLVITE) 1 MG tablet Take 1 tablet (1 mg total) by mouth daily. 30 tablet 0  . isosorbide mononitrate (IMDUR) 60 MG 24 hr tablet Take 1 tablet (60 mg total) by mouth daily. 30 tablet 6  . Multiple Vitamin (MULTIVITAMIN) tablet Take 1 tablet by mouth 3 (three) times a week.     . ondansetron (ZOFRAN) 4 MG tablet Take 0.5-1 tablets (2-4 mg total) by mouth every 8 (eight) hours as needed for nausea or vomiting. 90 tablet 3  . oxyCODONE-acetaminophen (PERCOCET/ROXICET) 5-325 MG tablet Take 1-2 tablets by mouth every 6 (six) hours as needed for severe pain. 60 tablet  0  . pravastatin (PRAVACHOL) 40 MG tablet Take 40 mg by mouth every evening.    . vitamin C (ASCORBIC ACID) 500 MG tablet Take 500 mg by mouth daily.    . Vitamin D, Ergocalciferol, (DRISDOL) 50000 units CAPS capsule 1 cap po q week x 12 weeks (Patient taking differently: Take 50,000 Units by mouth every 7 (seven) days. For 12 weeks. (Thursdays)) 12 capsule 1  . warfarin (COUMADIN) 3 MG tablet Take 1/2 to 1 tablet by mouth daily as directed by coumadin clinic (Patient taking differently: Take 1.5-3 mg by mouth See admin instructions. Take 1/2 tablet on Monday, Wednesday and Friday then take 1 tablet all the other days) 30 tablet 1   No  current facility-administered medications for this visit.     Allergies  Allergen Reactions  . Clonidine Derivatives Palpitations and Other (See Comments)    Very sedated  . Codeine Nausea Only  . Nickel Rash  . Sulfa Antibiotics Other (See Comments)    Reaction unknown    Past Medical History:  Diagnosis Date  . Anemia of chronic disease 2018   + anemia of CRI?  [name] Atrophy of left kidney    with absent blood flow by renal artery dopplers (Dr. Gwenlyn Found)  . 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 Chronic combined systolic and diastolic CHF (congestive heart failure) ([place])    a. 11/2016: echo showing EF of 35-40%, RV strain noted, mild MR and mild TR.   [name] Chronic hypoxemic respiratory failure ([place])   . Debilitated patient    WC dependent as of 2018  . ESRD on hemodialysis ([place]) 01/2017   T/Th/Sat schedule  . History of adenomatous polyp of colon   . History of subarachnoid hemorrhage 10/2014   after syncope and while on xarelto  . Hyperlipidemia   . Hypertension    Difficult to control, in the setting of one functioning kidney: pt was referred to nephrology by Dr. Gwenlyn Found 06/2015.  . Lumbar radiculopathy 2012  . Lumbar spondylosis    MR 07/2016---no sign of spinal nerve compression or cord compression.  Pt set up with outpt ortho while admitted to hosp 07/2016.  . Malnourished ([place])   . Metatarsal fracture 06/10/2016   Nondisplaced, left 5th metatarsal--pt was referred to ortho  . Nonischemic cardiomyopathy ([place])   . Osteopenia 2014   T-score -2.1  . PAF (paroxysmal atrial fibrillation) (HCC)    Eliquis started after BRAO and CVA.  She was changed to warfarin 2018--INR/coumadin mgmt per HD/nephrol.  . Pulmonary edema 11/2016  . Right rib fracture 12/2016   s/p fall  . Stroke [facility])    cardioembolic (had CVA while on no anticoag)--"scattered subacute punctate infarcts: 1 in R parietal lobe and 2 in occipital cortex" on MRI br.  CT angio head/neck: aortic arch athero.     . Thoracic compression fracture ([place]) 07/2016   T7 and T8-- T8 kyphoplasty during hosp admission 07/2016.  Neuro referred pt to pain mgmt for consideration of injection 09/2016.  I referred her to endo 08/2016 for consideration of calcitonin treatment.    Social History   Social History  . Marital status: Widowed    Spouse name: N/A  . Number of children: N/A  . Years of education: N/A   Occupational History  . Not on file.   Social History Main Topics  . Smoking status: Never Smoker  . Smokeless tobacco: Never Used  . Alcohol use 0.6 oz/week    1  Glasses of wine per week     Comment: wine  . Drug use: No  . Sexual activity: No   Other Topics Concern  . Not on file   Social History Narrative   Widow.  One daughter, lives with her.   Educ: college   Occup: retired Marine scientist.   No T/A/Ds.   She is almost a vegetarian.     Family History  Problem Relation Age of Onset  . Cancer Mother   . Heart disease Father   . Early death Father   . Sudden Cardiac Death Neg Hx      Review of Systems: General: negative for chills, fever, night sweats or weight changes.  Cardiovascular: negative for chest pain, dyspnea on exertion, edema, orthopnea, palpitations, paroxysmal nocturnal dyspnea or shortness of breath Dermatological: negative for rash Respiratory: negative for cough or wheezing Urologic: negative for hematuria Abdominal: negative for nausea, vomiting, diarrhea, bright red blood per rectum, melena, or hematemesis Neurologic: negative for visual changes, syncope, or dizziness All other systems reviewed and are otherwise negative except as noted above.    Blood pressure (!) 158/64, pulse 60, height 4\' 11"  (1.499 m), weight 110 lb 12.8 oz (50.3 kg).  General appearance: alert, cooperative, no distress and uncofortable with back pain, in wheel chair Lungs: kyphoscoliosis, decreased breaths ounds Rt base Heart: regular rate and rhythm Extremities: trace LE edema Skin:  pale, cool, dry Neurologic: Grossly normal  EKG A paced  ASSESSMENT AND PLAN:   Non-ischemic cardiomyopathy ([place]) Echo July 2018 shows EF 35-40% with no regional wall motion abnormality. Moderate concentric LVH. PA pressures up to 47 mmHg with RV EF decreased.  Atrial fibrillation (HCC) [I48.91] Currently paced, on Amiodarone 200 mg daily  Cardiac pacemaker St Jude PTVDP placed 12/03/16  History of cardioembolic stroke Embolic stroke (retinal artery occlusion and embolic bilateral micro emboli on MRI) 01/30/16  Long term (current) use of anticoagulants [Z79.01] Coumadin Rx now that she is on HD  End stage renal failure on dialysis [facility]) On HD [facility]) T-Th-Sat. Dr Florene Glen follows  Essential hypertension Labile B/P- suspect this will come under better control now that she is on HD  Chronic respiratory failure with hypoxia ([place]) Now on chronic O2  Chronic midline thoracic back pain I suggested the pt see a specialist. She'll check with PCP   PLAN  I did not change her Cardiac Rx. Her hydralazine has been stopped. If she has problems with hypotension on HD I would stop her Norvasc first, then decrease Coreg and Imdur if needed. She has a f/u with Dr Lovena Le in Oct and Dr Gwenlyn Found in Nov.   Kerin Ransom PA-C 02/11/2017 11:52 AM

## 2017-02-11 NOTE — Assessment & Plan Note (Signed)
St Jude PTVDP placed 12/03/16

## 2017-02-11 NOTE — Assessment & Plan Note (Signed)
On HD [facility]) T-Th-Sat. Dr Florene Glen follows

## 2017-02-11 NOTE — Assessment & Plan Note (Signed)
Labile B/P- suspect this will come under better control now that she is on HD

## 2017-02-11 NOTE — Assessment & Plan Note (Signed)
I suggested the pt see a specialist. She'll check with PCP

## 2017-02-11 NOTE — Assessment & Plan Note (Signed)
Currently paced, on Amiodarone 200 mg daily

## 2017-02-11 NOTE — Assessment & Plan Note (Signed)
Coumadin Rx now that she is on HD

## 2017-02-11 NOTE — Patient Instructions (Addendum)
Medication Instructions:  Your physician recommends that you continue on your current medications as directed. Please refer to the Current Medication list given to you today.   Labwork: None ordered  Testing/Procedures: None ordered  Follow-Up: Your physician recommends that you schedule a follow-up appointment in:  [place].  (DR. Lovena Le AND DR. Gwenlyn Found )   Any Other Special Instructions Will Be Listed Below (If Applicable).     If you need a refill on your cardiac medications before your next appointment, please call your pharmacy.

## 2017-02-11 NOTE — Telephone Encounter (Signed)
Pts daughter called requesting a referral to orthopedic doctor for pts back pain. She stated that pts cardiologist recommended Dr. Nelva Bush. Please advise. Thanks.

## 2017-02-12 NOTE — Telephone Encounter (Signed)
Pts daughter advised and voiced understanding, okay per DPR.

## 2017-02-13 ENCOUNTER — Emergency Department (HOSPITAL_COMMUNITY): Payer: Medicare Other

## 2017-02-13 ENCOUNTER — Inpatient Hospital Stay (HOSPITAL_COMMUNITY)
Admission: EM | Admit: 2017-02-13 | Discharge: 2017-02-18 | DRG: 291 | Disposition: A | Discharge status: Home Health | Payer: Medicare Other | Attending: Internal Medicine | Admitting: Internal Medicine

## 2017-02-13 ENCOUNTER — Encounter (HOSPITAL_COMMUNITY): Payer: Self-pay | Admitting: Emergency Medicine

## 2017-02-13 DIAGNOSIS — R609 Edema, unspecified: Secondary | ICD-10-CM

## 2017-02-13 DIAGNOSIS — Z992 Dependence on renal dialysis: Secondary | ICD-10-CM | POA: Diagnosis not present

## 2017-02-13 DIAGNOSIS — I959 Hypotension, unspecified: Secondary | ICD-10-CM | POA: Diagnosis present

## 2017-02-13 DIAGNOSIS — Z6822 Body mass index (BMI) 22.0-22.9, adult: Secondary | ICD-10-CM

## 2017-02-13 DIAGNOSIS — R7989 Other specified abnormal findings of blood chemistry: Secondary | ICD-10-CM | POA: Diagnosis present

## 2017-02-13 DIAGNOSIS — I10 Essential (primary) hypertension: Secondary | ICD-10-CM | POA: Diagnosis not present

## 2017-02-13 DIAGNOSIS — J810 Acute pulmonary edema: Secondary | ICD-10-CM | POA: Diagnosis not present

## 2017-02-13 DIAGNOSIS — J9601 Acute respiratory failure with hypoxia: Secondary | ICD-10-CM | POA: Diagnosis present

## 2017-02-13 DIAGNOSIS — E876 Hypokalemia: Secondary | ICD-10-CM | POA: Diagnosis not present

## 2017-02-13 DIAGNOSIS — D689 Coagulation defect, unspecified: Secondary | ICD-10-CM | POA: Diagnosis not present

## 2017-02-13 DIAGNOSIS — Z91048 Other nonmedicinal substance allergy status: Secondary | ICD-10-CM

## 2017-02-13 DIAGNOSIS — I5023 Acute on chronic systolic (congestive) heart failure: Secondary | ICD-10-CM | POA: Diagnosis not present

## 2017-02-13 DIAGNOSIS — I5043 Acute on chronic combined systolic (congestive) and diastolic (congestive) heart failure: Secondary | ICD-10-CM | POA: Diagnosis present

## 2017-02-13 DIAGNOSIS — M8580 Other specified disorders of bone density and structure, unspecified site: Secondary | ICD-10-CM | POA: Diagnosis present

## 2017-02-13 DIAGNOSIS — I4891 Unspecified atrial fibrillation: Secondary | ICD-10-CM | POA: Diagnosis not present

## 2017-02-13 DIAGNOSIS — E785 Hyperlipidemia, unspecified: Secondary | ICD-10-CM | POA: Diagnosis present

## 2017-02-13 DIAGNOSIS — Y92230 Patient room in hospital as the place of occurrence of the external cause: Secondary | ICD-10-CM | POA: Diagnosis present

## 2017-02-13 DIAGNOSIS — Z882 Allergy status to sulfonamides status: Secondary | ICD-10-CM | POA: Diagnosis not present

## 2017-02-13 DIAGNOSIS — E43 Unspecified severe protein-calorie malnutrition: Secondary | ICD-10-CM | POA: Diagnosis not present

## 2017-02-13 DIAGNOSIS — I272 Pulmonary hypertension, unspecified: Secondary | ICD-10-CM | POA: Diagnosis present

## 2017-02-13 DIAGNOSIS — I480 Paroxysmal atrial fibrillation: Secondary | ICD-10-CM

## 2017-02-13 DIAGNOSIS — Z7901 Long term (current) use of anticoagulants: Secondary | ICD-10-CM

## 2017-02-13 DIAGNOSIS — Z888 Allergy status to other drugs, medicaments and biological substances status: Secondary | ICD-10-CM

## 2017-02-13 DIAGNOSIS — E875 Hyperkalemia: Secondary | ICD-10-CM | POA: Diagnosis present

## 2017-02-13 DIAGNOSIS — R918 Other nonspecific abnormal finding of lung field: Secondary | ICD-10-CM | POA: Diagnosis not present

## 2017-02-13 DIAGNOSIS — Z809 Family history of malignant neoplasm, unspecified: Secondary | ICD-10-CM | POA: Diagnosis not present

## 2017-02-13 DIAGNOSIS — D638 Anemia in other chronic diseases classified elsewhere: Secondary | ICD-10-CM | POA: Diagnosis present

## 2017-02-13 DIAGNOSIS — I5041 Acute combined systolic (congestive) and diastolic (congestive) heart failure: Secondary | ICD-10-CM | POA: Diagnosis not present

## 2017-02-13 DIAGNOSIS — Z79899 Other long term (current) drug therapy: Secondary | ICD-10-CM

## 2017-02-13 DIAGNOSIS — Z86010 Personal history of colonic polyps: Secondary | ICD-10-CM

## 2017-02-13 DIAGNOSIS — R0602 Shortness of breath: Secondary | ICD-10-CM

## 2017-02-13 DIAGNOSIS — R748 Abnormal levels of other serum enzymes: Secondary | ICD-10-CM | POA: Diagnosis not present

## 2017-02-13 DIAGNOSIS — Z8249 Family history of ischemic heart disease and other diseases of the circulatory system: Secondary | ICD-10-CM

## 2017-02-13 DIAGNOSIS — I132 Hypertensive heart and chronic kidney disease with heart failure and with stage 5 chronic kidney disease, or end stage renal disease: Secondary | ICD-10-CM | POA: Diagnosis not present

## 2017-02-13 DIAGNOSIS — I701 Atherosclerosis of renal artery: Secondary | ICD-10-CM | POA: Diagnosis not present

## 2017-02-13 DIAGNOSIS — B369 Superficial mycosis, unspecified: Secondary | ICD-10-CM | POA: Diagnosis present

## 2017-02-13 DIAGNOSIS — N2581 Secondary hyperparathyroidism of renal origin: Secondary | ICD-10-CM | POA: Diagnosis not present

## 2017-02-13 DIAGNOSIS — Z885 Allergy status to narcotic agent status: Secondary | ICD-10-CM

## 2017-02-13 DIAGNOSIS — J9600 Acute respiratory failure, unspecified whether with hypoxia or hypercapnia: Secondary | ICD-10-CM | POA: Diagnosis present

## 2017-02-13 DIAGNOSIS — N186 End stage renal disease: Secondary | ICD-10-CM

## 2017-02-13 DIAGNOSIS — Z789 Other specified health status: Secondary | ICD-10-CM | POA: Diagnosis not present

## 2017-02-13 DIAGNOSIS — R069 Unspecified abnormalities of breathing: Secondary | ICD-10-CM | POA: Diagnosis not present

## 2017-02-13 DIAGNOSIS — Z09 Encounter for follow-up examination after completed treatment for conditions other than malignant neoplasm: Secondary | ICD-10-CM

## 2017-02-13 DIAGNOSIS — E8889 Other specified metabolic disorders: Secondary | ICD-10-CM | POA: Diagnosis present

## 2017-02-13 DIAGNOSIS — Z950 Presence of cardiac pacemaker: Secondary | ICD-10-CM

## 2017-02-13 DIAGNOSIS — Z8673 Personal history of transient ischemic attack (TIA), and cerebral infarction without residual deficits: Secondary | ICD-10-CM

## 2017-02-13 DIAGNOSIS — R5381 Other malaise: Secondary | ICD-10-CM | POA: Diagnosis not present

## 2017-02-13 DIAGNOSIS — R778 Other specified abnormalities of plasma proteins: Secondary | ICD-10-CM | POA: Diagnosis present

## 2017-02-13 DIAGNOSIS — E8770 Fluid overload, unspecified: Secondary | ICD-10-CM | POA: Diagnosis not present

## 2017-02-13 DIAGNOSIS — J9621 Acute and chronic respiratory failure with hypoxia: Secondary | ICD-10-CM | POA: Diagnosis not present

## 2017-02-13 DIAGNOSIS — W19XXXA Unspecified fall, initial encounter: Secondary | ICD-10-CM | POA: Diagnosis present

## 2017-02-13 DIAGNOSIS — I429 Cardiomyopathy, unspecified: Secondary | ICD-10-CM | POA: Diagnosis not present

## 2017-02-13 DIAGNOSIS — D631 Anemia in chronic kidney disease: Secondary | ICD-10-CM | POA: Diagnosis not present

## 2017-02-13 DIAGNOSIS — I120 Hypertensive chronic kidney disease with stage 5 chronic kidney disease or end stage renal disease: Secondary | ICD-10-CM | POA: Diagnosis not present

## 2017-02-13 DIAGNOSIS — Z79891 Long term (current) use of opiate analgesic: Secondary | ICD-10-CM

## 2017-02-13 LAB — COMPREHENSIVE METABOLIC PANEL
ALT: 23 U/L (ref 14–54)
AST: 34 U/L (ref 15–41)
Albumin: 3.6 g/dL (ref 3.5–5.0)
Alkaline Phosphatase: 89 U/L (ref 38–126)
Anion gap: 10 (ref 5–15)
BUN: 38 mg/dL — ABNORMAL HIGH (ref 6–20)
CO2: 25 mmol/L (ref 22–32)
Calcium: 9.2 mg/dL (ref 8.9–10.3)
Chloride: 99 mmol/L — ABNORMAL LOW (ref 101–111)
Creatinine, Ser: 3.05 mg/dL — ABNORMAL HIGH (ref 0.44–1.00)
GFR calc Af Amer: 15 mL/min — ABNORMAL LOW (ref >60)
GFR calc non Af Amer: 13 mL/min — ABNORMAL LOW (ref >60)
Glucose, Bld: 143 mg/dL — ABNORMAL HIGH (ref 65–99)
Potassium: 7.3 mmol/L (ref 3.5–5.1)
Sodium: 134 mmol/L — ABNORMAL LOW (ref 135–145)
Total Bilirubin: 0.6 mg/dL (ref 0.3–1.2)
Total Protein: 7.3 g/dL (ref 6.5–8.1)

## 2017-02-13 LAB — I-STAT VENOUS BLOOD GAS, ED
Acid-Base Excess: 4 mmol/L — ABNORMAL HIGH (ref 0.0–2.0)
Bicarbonate: 31 mmol/L — ABNORMAL HIGH (ref 20.0–28.0)
O2 Saturation: 60 %
TCO2: 33 mmol/L — ABNORMAL HIGH (ref 22–32)
pCO2, Ven: 56.6 mmHg (ref 44.0–60.0)
pH, Ven: 7.347 (ref 7.250–7.430)
pO2, Ven: 34 mmHg (ref 32.0–45.0)

## 2017-02-13 LAB — RENAL FUNCTION PANEL
ANION GAP: 9 (ref 5–15)
Albumin: 3 g/dL — ABNORMAL LOW (ref 3.5–5.0)
BUN: 40 mg/dL — ABNORMAL HIGH (ref 6–20)
CHLORIDE: 98 mmol/L — AB (ref 101–111)
CO2: 26 mmol/L (ref 22–32)
Calcium: 8.7 mg/dL — ABNORMAL LOW (ref 8.9–10.3)
Creatinine, Ser: 2.95 mg/dL — ABNORMAL HIGH (ref 0.44–1.00)
GFR, EST AFRICAN AMERICAN: 16 mL/min — AB (ref >60)
GFR, EST NON AFRICAN AMERICAN: 14 mL/min — AB (ref >60)
Glucose, Bld: 109 mg/dL — ABNORMAL HIGH (ref 65–99)
Phosphorus: 6.7 mg/dL — ABNORMAL HIGH (ref 2.5–4.6)
Potassium: 6.8 mmol/L (ref 3.5–5.1)
Sodium: 133 mmol/L — ABNORMAL LOW (ref 135–145)

## 2017-02-13 LAB — CBC WITH DIFFERENTIAL/PLATELET
BASOS ABS: 0 10*3/uL (ref 0.0–0.1)
BASOS PCT: 0 %
EOS ABS: 0.2 10*3/uL (ref 0.0–0.7)
EOS PCT: 2 %
HCT: 42.6 % (ref 36.0–46.0)
Hemoglobin: 13.2 g/dL (ref 12.0–15.0)
Lymphocytes Relative: 10 %
Lymphs Abs: 1.1 10*3/uL (ref 0.7–4.0)
MCH: 30.3 pg (ref 26.0–34.0)
MCHC: 31 g/dL (ref 30.0–36.0)
MCV: 97.7 fL (ref 78.0–100.0)
MONO ABS: 0.9 10*3/uL (ref 0.1–1.0)
Monocytes Relative: 8 %
Neutro Abs: 8.6 10*3/uL — ABNORMAL HIGH (ref 1.7–7.7)
Neutrophils Relative %: 80 %
PLATELETS: 259 10*3/uL (ref 150–400)
RBC: 4.36 MIL/uL (ref 3.87–5.11)
RDW: 17.4 % — AB (ref 11.5–15.5)
WBC: 10.9 10*3/uL — ABNORMAL HIGH (ref 4.0–10.5)

## 2017-02-13 LAB — BRAIN NATRIURETIC PEPTIDE: B Natriuretic Peptide: 1035.7 pg/mL — ABNORMAL HIGH (ref 0.0–100.0)

## 2017-02-13 LAB — TROPONIN I: Troponin I: 0.03 ng/mL (ref <0.03)

## 2017-02-13 MED ORDER — ACETAMINOPHEN 325 MG PO TABS
ORAL_TABLET | ORAL | Status: AC
  Administered 2017-02-13: 650 mg via ORAL
  Filled 2017-02-13: qty 2

## 2017-02-13 MED ORDER — SODIUM CHLORIDE 0.9% FLUSH
3.0000 mL | Freq: Two times a day (BID) | INTRAVENOUS | Status: DC
  Administered 2017-02-13 – 2017-02-17 (×7): 3 mL via INTRAVENOUS

## 2017-02-13 MED ORDER — ACETAMINOPHEN 650 MG RE SUPP
650.0000 mg | Freq: Four times a day (QID) | RECTAL | Status: DC | PRN
  Filled 2017-02-13: qty 1

## 2017-02-13 MED ORDER — NITROGLYCERIN IN D5W 200-5 MCG/ML-% IV SOLN
0.0000 ug/min | Freq: Once | INTRAVENOUS | Status: AC
  Administered 2017-02-13: 5 ug/min via INTRAVENOUS
  Filled 2017-02-13: qty 250

## 2017-02-13 MED ORDER — HEPARIN SODIUM (PORCINE) 5000 UNIT/ML IJ SOLN: 5000.0000 [IU] | Freq: Three times a day (TID) | INTRAMUSCULAR | Status: DC

## 2017-02-13 MED ORDER — SODIUM CHLORIDE 0.9 % IV SOLN: 250.0000 mL | INTRAVENOUS | Status: DC | PRN

## 2017-02-13 MED ORDER — SODIUM CHLORIDE 0.9 % IV SOLN: 100.0000 mL | INTRAVENOUS | Status: DC | PRN

## 2017-02-13 MED ORDER — LIDOCAINE HCL (PF) 1 % IJ SOLN: 5.0000 mL | INTRAMUSCULAR | Status: DC | PRN

## 2017-02-13 MED ORDER — AMIODARONE HCL 200 MG PO TABS
200.0000 mg | ORAL_TABLET | Freq: Every day | ORAL | Status: DC
  Administered 2017-02-14 – 2017-02-18 (×5): 200 mg via ORAL
  Filled 2017-02-13 (×5): qty 1

## 2017-02-13 MED ORDER — ACETAMINOPHEN 325 MG PO TABS
650.0000 mg | ORAL_TABLET | Freq: Four times a day (QID) | ORAL | Status: DC | PRN
  Administered 2017-02-13 – 2017-02-15 (×2): 650 mg via ORAL
  Filled 2017-02-13: qty 2

## 2017-02-13 MED ORDER — ALTEPLASE 2 MG IJ SOLR: 2.0000 mg | Freq: Once | INTRAMUSCULAR | Status: DC | PRN

## 2017-02-13 MED ORDER — SODIUM CHLORIDE 0.9% FLUSH: 3.0000 mL | INTRAVENOUS | Status: DC | PRN

## 2017-02-13 MED ORDER — AMLODIPINE BESYLATE 5 MG PO TABS
5.0000 mg | ORAL_TABLET | Freq: Every day | ORAL | Status: DC
  Administered 2017-02-14: 5 mg via ORAL
  Filled 2017-02-13: qty 1

## 2017-02-13 MED ORDER — CARVEDILOL 12.5 MG PO TABS
25.0000 mg | ORAL_TABLET | Freq: Two times a day (BID) | ORAL | Status: DC
  Administered 2017-02-14: 25 mg via ORAL
  Filled 2017-02-13: qty 2

## 2017-02-13 MED ORDER — HEPARIN SODIUM (PORCINE) 1000 UNIT/ML DIALYSIS
1000.0000 [IU] | INTRAMUSCULAR | Status: DC | PRN
  Filled 2017-02-13: qty 1

## 2017-02-13 MED ORDER — HEPARIN SODIUM (PORCINE) 1000 UNIT/ML DIALYSIS
20.0000 [IU]/kg | INTRAMUSCULAR | Status: DC | PRN
  Filled 2017-02-13: qty 1

## 2017-02-13 MED ORDER — PRAVASTATIN SODIUM 40 MG PO TABS
40.0000 mg | ORAL_TABLET | Freq: Every evening | ORAL | Status: DC
  Administered 2017-02-13 – 2017-02-17 (×5): 40 mg via ORAL
  Filled 2017-02-13 (×5): qty 1

## 2017-02-13 MED ORDER — FOLIC ACID 1 MG PO TABS
1.0000 mg | ORAL_TABLET | Freq: Every day | ORAL | Status: DC
  Administered 2017-02-14 – 2017-02-18 (×5): 1 mg via ORAL
  Filled 2017-02-13 (×5): qty 1

## 2017-02-13 MED ORDER — ONDANSETRON HCL 4 MG PO TABS
4.0000 mg | ORAL_TABLET | Freq: Four times a day (QID) | ORAL | Status: DC | PRN
  Filled 2017-02-13: qty 1

## 2017-02-13 MED ORDER — OXYCODONE-ACETAMINOPHEN 5-325 MG PO TABS
1.0000 | ORAL_TABLET | Freq: Four times a day (QID) | ORAL | Status: DC | PRN
  Administered 2017-02-13 – 2017-02-14 (×2): 2 via ORAL
  Administered 2017-02-14: 1 via ORAL
  Administered 2017-02-15: 2 via ORAL
  Administered 2017-02-16: 1 via ORAL
  Administered 2017-02-16 – 2017-02-17 (×2): 2 via ORAL
  Filled 2017-02-13: qty 2
  Filled 2017-02-13 (×3): qty 1
  Filled 2017-02-13: qty 2
  Filled 2017-02-13: qty 1
  Filled 2017-02-13: qty 2

## 2017-02-13 MED ORDER — PENTAFLUOROPROP-TETRAFLUOROETH EX AERO: 1.0000 "application " | INHALATION_SPRAY | CUTANEOUS | Status: DC | PRN

## 2017-02-13 MED ORDER — ONDANSETRON HCL 4 MG/2ML IJ SOLN
4.0000 mg | Freq: Four times a day (QID) | INTRAMUSCULAR | Status: DC | PRN
  Filled 2017-02-13: qty 2

## 2017-02-13 MED ORDER — WARFARIN SODIUM 3 MG PO TABS: 1.5000 mg | ORAL_TABLET | ORAL | Status: DC

## 2017-02-13 MED ORDER — ISOSORBIDE MONONITRATE ER 30 MG PO TB24
60.0000 mg | ORAL_TABLET | Freq: Every day | ORAL | Status: DC
  Administered 2017-02-14: 60 mg via ORAL
  Filled 2017-02-13: qty 2

## 2017-02-13 MED ORDER — OXYCODONE-ACETAMINOPHEN 5-325 MG PO TABS
ORAL_TABLET | ORAL | Status: AC
  Administered 2017-02-13: 2 via ORAL
  Filled 2017-02-13: qty 2

## 2017-02-13 MED ORDER — LIDOCAINE-PRILOCAINE 2.5-2.5 % EX CREA: 1.0000 "application " | TOPICAL_CREAM | CUTANEOUS | Status: DC | PRN

## 2017-02-13 MED ORDER — FUROSEMIDE 10 MG/ML IJ SOLN
80.0000 mg | Freq: Once | INTRAMUSCULAR | Status: AC
  Administered 2017-02-13: 80 mg via INTRAVENOUS
  Filled 2017-02-13: qty 8

## 2017-02-13 NOTE — Consult Note (Signed)
[facility] Renal Consultation Note    Indication for Consultation:  Management of ESRD/hemodialysis; anemia, hypertension/volume and secondary hyperparathyroidism  PCP: Shawnie Dapper, [facility]  HPI: Valerie Terrell is a 83 y.o. female new ESRD dx, started HD 01/29/17 at [facility]. ESRD secondary to cardiorenal syndrome/solitary kidney. PMH includes  acute on chronic systolic HF, Last EF 32-95% 11/2016,  nonischemic cardiomyopathy, s/p PPM placement 11/2016, PAF on coumadin (coumadin clinic) HTN, h/o CVA, generalized anxiety depression, kyphoplasty in 07/2016 subarachnoid hemorrhage 10/2014, Thoracic compression fracture, R Rib fx, osteopenia, PCM.   Dialyzing at [facility] TTS for about 1 week- started 9/20. Presented to dialysis center this afternoon with chest pain and dyspnea, not improved with nasal oxygen. Transported to [facility] ED via EMS. On arrival, in respiratory distress with O2 sats noted 89%, BiPAP started in ED. CXR showing bilateral airspace opacity concerning for pulmonary edema. Labs significant for  K  7.3 (slight hemolysis noted).   Seen in ED with daughter present. Wearing BiPAP and getting nitroglycerin gtt. Patient says symptoms started this morning.  She felt some "fluttering" in her chest this morning and was having more difficulty breathing. Uses 2L [place] at home. Started having chest pains when she arrived to dialysis. Currently says breathing has improved some and no chest pains. Denies fever,chills, cough, nausea, vomiting, diarrhea.   Last HD was Thursday. Left 4.7kg over EDW,with minimal UF during treatment. Provider notes patient taking patient took BP meds prior to treatment.   Past Medical History:  Diagnosis Date  . Anemia of chronic disease 2018   + anemia of CRI?  [name] Atrophy of left kidney    with absent blood flow by renal artery dopplers (Dr. Gwenlyn Found)  . 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 Chronic combined  systolic and diastolic CHF (congestive heart failure) ([place])    a. 11/2016: echo showing EF of 35-40%, RV strain noted, mild MR and mild TR.   [name] Chronic hypoxemic respiratory failure ([place])   . Debilitated patient    WC dependent as of 2018  . ESRD on hemodialysis ([place]) 01/2017   T/Th/Sat schedule  . History of adenomatous polyp of colon   . History of subarachnoid hemorrhage 10/2014   after syncope and while on xarelto  . Hyperlipidemia   . Hypertension    Difficult to control, in the setting of one functioning kidney: pt was referred to nephrology by Dr. Gwenlyn Found 06/2015.  . Lumbar radiculopathy 2012  . Lumbar spondylosis    MR 07/2016---no sign of spinal nerve compression or cord compression.  Pt set up with outpt ortho while admitted to hosp 07/2016.  . Malnourished ([place])   . Metatarsal fracture 06/10/2016   Nondisplaced, left 5th metatarsal--pt was referred to ortho  . Nonischemic cardiomyopathy ([place])   . Osteopenia 2014   T-score -2.1  . PAF (paroxysmal atrial fibrillation) (HCC)    Eliquis started after BRAO and CVA.  She was changed to warfarin 2018--INR/coumadin mgmt per HD/nephrol.  . Pulmonary edema 11/2016  . Right rib fracture 12/2016   s/p fall  . Stroke [facility])    cardioembolic (had CVA while on no anticoag)--"scattered subacute punctate infarcts: 1 in R parietal lobe and 2 in occipital cortex" on MRI br.  CT angio head/neck: aortic arch athero.   . Thoracic compression fracture ([place]) 07/2016   T7 and T8-- T8 kyphoplasty during hosp admission 07/2016.  Neuro referred pt to pain mgmt for consideration of injection 09/2016.  I referred her to endo 08/2016 for consideration of calcitonin treatment.   Past Surgical History:  Procedure Laterality Date  . APPENDECTOMY  child  . CARDIOVASCULAR STRESS TEST  01/31/2016   Stress myoview: NORMAL/Low risk.  EF 56%.  . [place]  . COLONOSCOPY  2015   + hx of adenomatous polyps.  Need digest health spec in [place] records to  see when pt due for next colonoscopy  . INSERTION OF DIALYSIS CATHETER Right 01/29/2017   Procedure: INSERTION OF DIALYSIS CATHETER- RIGHT INTERNAL JUGULAR;  Surgeon: [name], MD;  Location: [facility] OR;  Service: Vascular;  Laterality: Right;  . IR GENERIC HISTORICAL  07/24/2016   IR KYPHO THORACIC WITH BONE BIOPSY 07/24/2016 Luanne Bras, MD [facility]-INTERV RAD  . KYPHOPLASTY  07/27/2016   T8  . PACEMAKER IMPLANT N/A 12/03/2016   Procedure: Pacemaker Implant;  Surgeon: Evans Lance, MD;  Location: [place] CV LAB;  Service: Cardiovascular;  Laterality: N/A;  . Renal artery dopplers  02/26/2016   Her right renal dimension was 11 cm pole to pole with mild to moderate right renal artery stenosis. A right renal aortic ratio was 3.22 suggesting less than a 50% stenosis.  . TONSILLECTOMY    . TRANSTHORACIC ECHOCARDIOGRAM  01/29/2016; 11/2016   2017: EF 55-60%, normal LV wall motion, grade I DD.  No cardiac source of emboli was seen.  2018: EF 35-40%, could not assess DD due to a-fib, pulm HTN noted.   Family History  Problem Relation Age of Onset  . Cancer Mother   . Heart disease Father   . Early death Father   . Sudden Cardiac Death Neg Hx    Social History:  reports that she has never smoked. She has never used smokeless tobacco. She reports that she drinks about 0.6 oz of alcohol per week . She reports that she does not use drugs. Allergies  Allergen Reactions  . Clonidine Derivatives Palpitations and Other (See Comments)    Very sedated  . Codeine Nausea Only  . Nickel Rash  . Sulfa Antibiotics Other (See Comments)    Reaction unknown   Prior to Admission medications   Medication Sig Start Date End Date Taking? Authorizing Provider  amiodarone (PACERONE) 200 MG tablet Take 1 tablet (200 mg total) by mouth daily. 02/03/17  Yes Hongalgi, Lenis Dickinson, MD  amLODipine (NORVASC) 5 MG tablet Take 5 mg by mouth daily.   Yes [provider]  carvedilol (COREG) 25 MG tablet Take 1  tablet (25 mg total) by mouth 2 (two) times daily with a meal. 02/08/17  Yes McGowen, Adrian Blackwater, MD  folic acid (FOLVITE) 1 MG tablet Take 1 tablet (1 mg total) by mouth daily. 02/04/17  Yes Hongalgi, Lenis Dickinson, MD  isosorbide mononitrate (IMDUR) 60 MG 24 hr tablet Take 1 tablet (60 mg total) by mouth daily. 02/08/17  Yes McGowen, Adrian Blackwater, MD  Multiple Vitamin (MULTIVITAMIN) tablet Take 1 tablet by mouth 3 (three) times a week.    Yes [provider]  ondansetron (ZOFRAN) 4 MG tablet Take 0.5-1 tablets (2-4 mg total) by mouth every 8 (eight) hours as needed for nausea or vomiting. 12/10/16  Yes McGowen, Adrian Blackwater, MD  oxyCODONE-acetaminophen (PERCOCET/ROXICET) 5-325 MG tablet Take 1-2 tablets by mouth every 6 (six) hours as needed for severe pain. 12/31/16  Yes McGowen, Adrian Blackwater, MD  pravastatin (PRAVACHOL) 40 MG tablet Take 40 mg by mouth every evening. 07/16/15  Yes [provider]  Vitamin D, Ergocalciferol, (DRISDOL) 50000 units CAPS capsule 1 cap po q week x 12 weeks Patient taking differently: Take 50,000 Units by mouth every 7 (seven) days. For 12 weeks. (Thursdays) 11/09/16  Yes McGowen, Adrian Blackwater, MD  warfarin (COUMADIN) 3 MG tablet Take 1/2 to 1 tablet by mouth daily as directed by coumadin clinic Patient taking differently: Take 1.5-3 mg by mouth See admin instructions. Take 1/2 tablet on Monday, Wednesday and Friday then take 1 tablet all the other days 12/21/16  Yes Lorretta Harp, MD  acetaminophen (TYLENOL) 500 MG tablet Take 500 mg by mouth every 6 (six) hours as needed for moderate pain.    [provider]  Calcium Carb-Cholecalciferol (CALCIUM/VITAMIN D PO) Take 1 tablet by mouth 2 (two) times daily.    [provider]  vitamin C (ASCORBIC ACID) 500 MG tablet Take 500 mg by mouth daily.    [provider]   No current facility-administered medications for this encounter.    Current Outpatient Prescriptions  Medication Sig Dispense Refill  .  amiodarone (PACERONE) 200 MG tablet Take 1 tablet (200 mg total) by mouth daily. 30 tablet 0  . amLODipine (NORVASC) 5 MG tablet Take 5 mg by mouth daily.    . carvedilol (COREG) 25 MG tablet Take 1 tablet (25 mg total) by mouth 2 (two) times daily with a meal. 60 tablet 6  . folic acid (FOLVITE) 1 MG tablet Take 1 tablet (1 mg total) by mouth daily. 30 tablet 0  . isosorbide mononitrate (IMDUR) 60 MG 24 hr tablet Take 1 tablet (60 mg total) by mouth daily. 30 tablet 6  . Multiple Vitamin (MULTIVITAMIN) tablet Take 1 tablet by mouth 3 (three) times a week.     . ondansetron (ZOFRAN) 4 MG tablet Take 0.5-1 tablets (2-4 mg total) by mouth every 8 (eight) hours as needed for nausea or vomiting. 90 tablet 3  . oxyCODONE-acetaminophen (PERCOCET/ROXICET) 5-325 MG tablet Take 1-2 tablets by mouth every 6 (six) hours as needed for severe pain. 60 tablet 0  . pravastatin (PRAVACHOL) 40 MG tablet Take 40 mg by mouth every evening.    . Vitamin D, Ergocalciferol, (DRISDOL) 50000 units CAPS capsule 1 cap po q week x 12 weeks (Patient taking differently: Take 50,000 Units by mouth every 7 (seven) days. For 12 weeks. (Thursdays)) 12 capsule 1  . warfarin (COUMADIN) 3 MG tablet Take 1/2 to 1 tablet by mouth daily as directed by coumadin clinic (Patient taking differently: Take 1.5-3 mg by mouth See admin instructions. Take 1/2 tablet on Monday, Wednesday and Friday then take 1 tablet all the other days) 30 tablet 1  . acetaminophen (TYLENOL) 500 MG tablet Take 500 mg by mouth every 6 (six) hours as needed for moderate pain.    . Calcium Carb-Cholecalciferol (CALCIUM/VITAMIN D PO) Take 1 tablet by mouth 2 (two) times daily.    . vitamin C (ASCORBIC ACID) 500 MG tablet Take 500 mg by mouth daily.       ROS: As per HPI otherwise negative.  Physical Exam: Vitals:   02/13/17 1415 02/13/17 1430 02/13/17 1445 02/13/17 1500  BP: (!) 179/82 (!) 180/61 (!) 166/61 (!) 171/66  Pulse: 81 60 60 60  Resp: (!) 21 20 16  (!)  24  SpO2: 100% 100% 100% 100%  Weight:      Height:         General: Frail elderly WF, ill appearing, wearing BiPAP   Head: NCAT sclera not icteric MMM  Neck: Supple. Unable to assess JVD  Lungs: Lung sounds diminshed bilat  Heart: RRR with S1 S2 Abdomen: soft NT + BS Lower extremities:  2+pitting edema LE R>L  Neuro: A & O  X 3. Moves all extremities spontaneously. Psych:  Responds to questions appropriately with a normal affect. Dialysis Access: R IJ TDC dsg  in place   Labs: Basic Metabolic Panel: No results for input(s): NA, K, CL, CO2, GLUCOSE, BUN, CREATININE, CALCIUM, PHOS in the last 168 hours.  Invalid input(s): ALB Liver Function Tests: No results for input(s): AST, ALT, ALKPHOS, BILITOT, PROT, ALBUMIN in the last 168 hours. No results for input(s): LIPASE, AMYLASE in the last 168 hours. No results for input(s): AMMONIA in the last 168 hours. CBC:  Recent Labs Lab 02/13/17 1357  WBC 10.9*  NEUTROABS 8.6*  HGB 13.2  HCT 42.6  MCV 97.7  PLT 259   Cardiac Enzymes: No results for input(s): CKTOTAL, CKMB, CKMBINDEX, TROPONINI in the last 168 hours. CBG: No results for input(s): GLUCAP in the last 168 hours. Iron Studies: No results for input(s): IRON, TIBC, TRANSFERRIN, FERRITIN in the last 72 hours. Studies/Results: Dg Chest Portable 1 View  Result Date: 02/13/2017 CLINICAL DATA:  83 year old female with a history of shortness of breath EXAM: PORTABLE CHEST 1 VIEW COMPARISON:  01/29/2017 FINDINGS: Cardiomediastinal silhouette is unchanged, with cardiomegaly and partial obscuration of left heart border. Calcifications of the aortic arch. Increasing airspace opacity bilaterally, most pronounced on the right. Interlobular septal thickening and interstitial opacities. Unchanged left chest wall cardiac pacing device with 2 leads in place. Unchanged right IJ approach central hemodialysis catheter, appearing to terminate at the superior right atrium. Prior vertebral  augmentation IMPRESSION: Interval worsening of airspace opacities, concerning for pulmonary edema. Infection cannot be excluded. Cardiomegaly. Unchanged right IJ hemodialysis catheter. Unchanged left sided chest wall cardiac pacing device Electronically Signed   By: Corrie Mckusick D.O.   On: 02/13/2017 14:04    Dialysis Orders:  [facility] TTS 4h 180F BFR 400/600 4K/2.25 Ca EDW 48.5kg  R IJ TDC Heparin 1400 U Bolus Venofer 100mg  IV q HD (until 10/18) Mircera 100 mcg IV q 2 weeks (last dosed 9/22)    Assessment/Plan: 1. Acute resp failure/pulmonary edema/CHF exacerbation - requiring BiPAP on admission, CXR with pulm edema, pitting edema on exam. Plan HD today for volume removal. May need serial treatments. Will reassess in am  2. Hyperkalemia - K 7.3 on admission (slight hemolysis noted) Repeat pre HD and correct with HD. Follow trend.  Notable last outpatient K was 6.3 on 9/27 and currently on 4K bath, will need to adjust when discharged.  3. ESRD - TTS. New HD start, unable to complete OP HD today  4.  Hypertension/volume  - BP elevated today, likely d/t to volume overload. Should improve with UF, hold BP meds prior to HD to allow volume removal. Actually would hold meds (norvasc and coreg while here and can establish her EDW)  5.  Anemia  - Hgb 13.2, on ESA/Fe bolus  6.  Metabolic bone disease -  No VDRA/binders yet. Follow renal panel here  7.  Nutrition - Alb 3.6. Renal diet/vitamins 8. P Afib - on Coumadin 9. CM s/p PPM EF 35-40%    Lynnda Child PA-C [place] Pager [phone] 02/13/2017, 3:06 PM   Patient seen and examined, agree with above note with above modifications. 83 year old female fairly new start to HD with dec EF, PPM, afib on coumadin and  hx of CVA.  We have not been able to get volume off at HD- noted that patient was taking BP meds prior, now presenting with volume overload and hypertension.  Emergent HD tonight - goal of 4-5 liters and may need serial  HD- stop BP meds while here to establish EDW  Corliss Parish, MD 02/13/2017

## 2017-02-13 NOTE — ED Provider Notes (Signed)
[facility] DEPT Provider Note   CSN: [id number] Arrival date & time: 02/13/17  1347     History   Chief Complaint Chief Complaint  Patient presents with  . Shortness of Breath    Brought by EMS on CPAP    HPI Valerie Terrell is a 83 y.o. female.   Shortness of Breath  This is a new problem. The average episode lasts 4 hours. The problem occurs continuously.The problem has been gradually worsening. Associated symptoms include cough. Pertinent negatives include no fever.    Past Medical History:  Diagnosis Date  . Anemia of chronic disease 2018   + anemia of CRI?  [name] Atrophy of left kidney    with absent blood flow by renal artery dopplers (Dr. Gwenlyn Found)  . 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 Chronic combined systolic and diastolic CHF (congestive heart failure) ([place])    a. 11/2016: echo showing EF of 35-40%, RV strain noted, mild MR and mild TR.   [name] Chronic hypoxemic respiratory failure ([place])   . Debilitated patient    WC dependent as of 2018  . ESRD on hemodialysis ([place]) 01/2017   T/Th/Sat schedule  . History of adenomatous polyp of colon   . History of subarachnoid hemorrhage 10/2014   after syncope and while on xarelto  . Hyperlipidemia   . Hypertension    Difficult to control, in the setting of one functioning kidney: pt was referred to nephrology by Dr. Gwenlyn Found 06/2015.  . Lumbar radiculopathy 2012  . Lumbar spondylosis    MR 07/2016---no sign of spinal nerve compression or cord compression.  Pt set up with outpt ortho while admitted to hosp 07/2016.  . Malnourished ([place])   . Metatarsal fracture 06/10/2016   Nondisplaced, left 5th metatarsal--pt was referred to ortho  . Nonischemic cardiomyopathy ([place])   . Osteopenia 2014   T-score -2.1  . PAF (paroxysmal atrial fibrillation) (HCC)    Eliquis started after BRAO and CVA.  She was changed to warfarin 2018--INR/coumadin mgmt per HD/nephrol.  . Pulmonary edema 11/2016  . Right rib fracture 12/2016   s/p fall  . Stroke [facility])    cardioembolic (had CVA while on no anticoag)--"scattered subacute punctate infarcts: 1 in R parietal lobe and 2 in occipital cortex" on MRI br.  CT angio head/neck: aortic arch athero.   . Thoracic compression fracture ([place]) 07/2016   T7 and T8-- T8 kyphoplasty during hosp admission 07/2016.  Neuro referred pt to pain mgmt for consideration of injection 09/2016.  I referred her to endo 08/2016 for consideration of calcitonin treatment.    Patient Active Problem List   Diagnosis Date Noted  . ESRD (end stage renal disease) ([place]) 02/13/2017  . End stage renal failure on dialysis ([place]) 02/11/2017  . Chronic respiratory failure with hypoxia ([place]) 02/11/2017  . Debilitated patient 02/07/2017  . Poor tolerance for ambulation 02/07/2017  . Fracture of one rib, right side, initial encounter for closed fracture 01/11/2017  . Acute on chronic systolic congestive heart failure ([place])   . Closed fracture of one rib of right side   . Chronic combined systolic and diastolic heart failure ([place]) 01/05/2017  . Palpitations 12/15/2016  . Hypokalemia 12/14/2016  . Leukocytosis 12/14/2016  . Atrial fibrillation ([place]) [I48.91] 12/11/2016  . Long term (current) use of anticoagulants [Z79.01] 12/11/2016  . Tachycardia-bradycardia syndrome ([place]) - s/p PPM 12/07/2016  . Non-ischemic cardiomyopathy ([place]) 12/07/2016  . Acute combined systolic and diastolic heart failure ([place])  12/07/2016  .  Cardiac pacemaker   . Acute on chronic respiratory failure with hypoxia ([place])   . Acute pulmonary edema with congestive heart failure ([place]) 11/30/2016  . Elevated troponin level 11/30/2016  . Anemia 11/30/2016  . Chronic midline thoracic back pain 09/15/2016  . Acute CVA (cerebrovascular accident) ([place]) 09/15/2016  . Hyperlipidemia   . Nausea & vomiting 08/05/2016  . Dehydration 08/05/2016  . Acute kidney injury superimposed on CKD ([place]) 08/05/2016  . Osteoporosis 07/28/2016  . Closed  compression fracture of thoracic vertebra ([place]) 07/23/2016  . Thoracic compression fracture ([place]) 07/22/2016  . Acute renal failure superimposed on chronic kidney disease ([place]) 07/11/2016  . Flank pain 07/11/2016  . Hypoalbuminemia 07/11/2016  . Renal artery stenosis ([place]) 06/24/2016  . Nondisplaced fracture of fifth metatarsal bone, left foot, initial encounter for closed fracture 06/10/2016  . Rib contusion, left, initial encounter 06/10/2016  . Single kidney 03/13/2016  . Chest pain   . Dyslipidemia   . PAF (paroxysmal atrial fibrillation) ([place]) 01/28/2016  . Essential hypertension 01/28/2016  . History of cardioembolic stroke 95/62/1308  . 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 Personal history of subarachnoid hemorrhage 01/28/2016  . CKD (chronic kidney disease), stage IV ([place]) 01/28/2016    Past Surgical History:  Procedure Laterality Date  . APPENDECTOMY  child  . CARDIOVASCULAR STRESS TEST  01/31/2016   Stress myoview: NORMAL/Low risk.  EF 56%.  . [place]  . COLONOSCOPY  2015   + hx of adenomatous polyps.  Need digest health spec in [place] records to see when pt due for next colonoscopy  . INSERTION OF DIALYSIS CATHETER Right 01/29/2017   Procedure: INSERTION OF DIALYSIS CATHETER- RIGHT INTERNAL JUGULAR;  Surgeon: [name], MD;  Location: [facility] OR;  Service: Vascular;  Laterality: Right;  . IR GENERIC HISTORICAL  07/24/2016   IR KYPHO THORACIC WITH BONE BIOPSY 07/24/2016 Luanne Bras, MD [facility]-INTERV RAD  . KYPHOPLASTY  07/27/2016   T8  . PACEMAKER IMPLANT N/A 12/03/2016   Procedure: Pacemaker Implant;  Surgeon: Evans Lance, MD;  Location: [facility] CV LAB;  Service: Cardiovascular;  Laterality: N/A;  . Renal artery dopplers  02/26/2016   Her right renal dimension was 11 cm pole to pole with mild to moderate right renal artery stenosis. A right renal aortic ratio was 3.22 suggesting less than a 50% stenosis.  . TONSILLECTOMY    .  TRANSTHORACIC ECHOCARDIOGRAM  01/29/2016; 11/2016   2017: EF 55-60%, normal LV wall motion, grade I DD.  No cardiac source of emboli was seen.  2018: EF 35-40%, could not assess DD due to a-fib, pulm HTN noted.    OB History    No data available       Home Medications    Prior to Admission medications   Medication Sig Start Date End Date Taking? Authorizing Provider  amiodarone (PACERONE) 200 MG tablet Take 1 tablet (200 mg total) by mouth daily. 02/03/17  Yes Hongalgi, Lenis Dickinson, MD  amLODipine (NORVASC) 5 MG tablet Take 5 mg by mouth daily.   Yes [provider]  carvedilol (COREG) 25 MG tablet Take 1 tablet (25 mg total) by mouth 2 (two) times daily with a meal. 02/08/17  Yes McGowen, Adrian Blackwater, MD  folic acid (FOLVITE) 1 MG tablet Take 1 tablet (1 mg total) by mouth daily. 02/04/17  Yes Hongalgi, Lenis Dickinson, MD  isosorbide mononitrate (IMDUR) 60 MG 24 hr tablet Take 1 tablet (60 mg total) by mouth  daily. 02/08/17  Yes McGowen, Adrian Blackwater, MD  Multiple Vitamin (MULTIVITAMIN) tablet Take 1 tablet by mouth 3 (three) times a week.    Yes [provider]  ondansetron (ZOFRAN) 4 MG tablet Take 0.5-1 tablets (2-4 mg total) by mouth every 8 (eight) hours as needed for nausea or vomiting. 12/10/16  Yes McGowen, Adrian Blackwater, MD  oxyCODONE-acetaminophen (PERCOCET/ROXICET) 5-325 MG tablet Take 1-2 tablets by mouth every 6 (six) hours as needed for severe pain. 12/31/16  Yes McGowen, Adrian Blackwater, MD  pravastatin (PRAVACHOL) 40 MG tablet Take 40 mg by mouth every evening. 07/16/15  Yes [provider]  Vitamin D, Ergocalciferol, (DRISDOL) 50000 units CAPS capsule 1 cap po q week x 12 weeks Patient taking differently: Take 50,000 Units by mouth every 7 (seven) days. For 12 weeks. (Thursdays) 11/09/16  Yes McGowen, Adrian Blackwater, MD  warfarin (COUMADIN) 3 MG tablet Take 1/2 to 1 tablet by mouth daily as directed by coumadin clinic Patient taking differently: Take 1.5-3 mg by mouth See admin  instructions. Take 1/2 tablet on Monday, Wednesday and Friday then take 1 tablet all the other days 12/21/16  Yes Lorretta Harp, MD  acetaminophen (TYLENOL) 500 MG tablet Take 500 mg by mouth every 6 (six) hours as needed for moderate pain.    [provider]  Calcium Carb-Cholecalciferol (CALCIUM/VITAMIN D PO) Take 1 tablet by mouth 2 (two) times daily.    [provider]  vitamin C (ASCORBIC ACID) 500 MG tablet Take 500 mg by mouth daily.    [provider]    Family History Family History  Problem Relation Age of Onset  . Cancer Mother   . Heart disease Father   . Early death Father   . Sudden Cardiac Death Neg Hx     Social History Social History  Substance Use Topics  . Smoking status: Never Smoker  . Smokeless tobacco: Never Used  . Alcohol use 0.6 oz/week    1 Glasses of wine per week     Comment: wine     Allergies   Clonidine derivatives; Codeine; Nickel; and Sulfa antibiotics   Review of Systems Review of Systems  Constitutional: Negative for fever.  Respiratory: Positive for cough and shortness of breath.   All other systems reviewed and are negative.    Physical Exam Updated Vital Signs BP (!) 153/63   Pulse 60   Resp 14   Ht 4\' 11"  (1.499 m)   Wt 49.9 kg (110 lb)   SpO2 100%   BMI 22.22 kg/m   Physical Exam  Constitutional: She is oriented to person, place, and time. She appears distressed.  HENT:  Head: Normocephalic and atraumatic.  Eyes: Pupils are equal, round, and reactive to light. Conjunctivae and EOM are normal.  Neck: Normal range of motion.  Pulmonary/Chest: She is in respiratory distress. She has decreased breath sounds. She has rales.  Abdominal: Soft. Bowel sounds are normal.  Musculoskeletal: Normal range of motion. She exhibits no edema or deformity.  Neurological: She is alert and oriented to person, place, and time.  Skin: Skin is warm and dry.     ED Treatments / Results  Labs (all labs  ordered are listed, but only abnormal results are displayed) Labs Reviewed  CBC WITH DIFFERENTIAL/PLATELET - Abnormal; Notable for the following:       Result Value   WBC 10.9 (*)    RDW 17.4 (*)    Neutro Abs 8.6 (*)    All other  components within normal limits  COMPREHENSIVE METABOLIC PANEL - Abnormal; Notable for the following:    Sodium 134 (*)    Potassium 7.3 (*)    Chloride 99 (*)    Glucose, Bld 143 (*)    BUN 38 (*)    Creatinine, Ser 3.05 (*)    GFR calc non Af Amer 13 (*)    GFR calc Af Amer 15 (*)    All other components within normal limits  TROPONIN I - Abnormal; Notable for the following:    Troponin I 0.03 (*)    All other components within normal limits  BRAIN NATRIURETIC PEPTIDE - Abnormal; Notable for the following:    B Natriuretic Peptide 1,035.7 (*)    All other components within normal limits  I-STAT VENOUS BLOOD GAS, ED - Abnormal; Notable for the following:    Bicarbonate 31.0 (*)    TCO2 33 (*)    Acid-Base Excess 4.0 (*)    All other components within normal limits  RENAL FUNCTION PANEL    EKG  EKG Interpretation  Date/Time:  Saturday February 13 2017 13:52:54 EDT Ventricular Rate:  83 PR Interval:    QRS Duration: 211 QT Interval:  482 QTC Calculation: 567 R Axis:   -120 Text Interpretation:  Ventricular-paced rhythm No further analysis attempted due to paced rhythm paced rhythm new from previous, no obvious ST elevations Confirmed by Merrily Pew [phone]) on 02/13/2017 5:15:05 PM       Radiology Dg Chest Portable 1 View  Result Date: 02/13/2017 CLINICAL DATA:  83 year old female with a history of shortness of breath EXAM: PORTABLE CHEST 1 VIEW COMPARISON:  01/29/2017 FINDINGS: Cardiomediastinal silhouette is unchanged, with cardiomegaly and partial obscuration of left heart border. Calcifications of the aortic arch. Increasing airspace opacity bilaterally, most pronounced on the right. Interlobular septal thickening and interstitial  opacities. Unchanged left chest wall cardiac pacing device with 2 leads in place. Unchanged right IJ approach central hemodialysis catheter, appearing to terminate at the superior right atrium. Prior vertebral augmentation IMPRESSION: Interval worsening of airspace opacities, concerning for pulmonary edema. Infection cannot be excluded. Cardiomegaly. Unchanged right IJ hemodialysis catheter. Unchanged left sided chest wall cardiac pacing device Electronically Signed   By: Corrie Mckusick D.O.   On: 02/13/2017 14:04    Procedures .Critical Care Performed by: Merrily Pew Authorized by: Merrily Pew   Critical care provider statement:    Critical care time (minutes):  45   Critical care start time:  02/13/2017 2:00 PM   Critical care end time:  02/13/2017 2:45 PM   Critical care time was exclusive of:  Separately billable procedures and treating other patients and teaching time   Critical care was necessary to treat or prevent imminent or life-threatening deterioration of the following conditions:  Respiratory failure   Critical care was time spent personally by me on the following activities:  Development of treatment plan with patient or surrogate, discussions with consultants, evaluation of patient's response to treatment, examination of patient, obtaining history from patient or surrogate, review of old charts, re-evaluation of patient's condition, pulse oximetry, ordering and review of radiographic studies, ordering and review of laboratory studies and ordering and performing treatments and interventions   I assumed direction of critical care for this patient from another provider in my specialty: no     (including critical care time)  Medications Ordered in ED Medications  nitroGLYCERIN 50 mg in dextrose 5 % 250 mL (0.2 mg/mL) infusion (15 mcg/min Intravenous Rate/Dose Change 02/13/17  1504)  furosemide (LASIX) injection 80 mg (80 mg Intravenous Given 02/13/17 1416)     Initial Impression /  Assessment and Plan / ED Course  I have reviewed the triage vital signs and the nursing notes.  Pertinent labs & imaging results that were available during my care of the patient were reviewed by me and considered in my medical decision making (see chart for details).     Suspect fluid overload as cause for symptoms, on Bipap, started ntg drip, gave lasix, discussed with nephrology who will see her for dialysis. Discussed with medicine who will admit for reevaluation and further management.   Final Clinical Impressions(s) / ED Diagnoses   Final diagnoses:  Shortness of breath  Acute respiratory failure with hypoxia [facility])    New Prescriptions New Prescriptions   No medications on file     Phoenyx Paulsen, Corene Cornea, MD 02/13/17 [phone]

## 2017-02-13 NOTE — ED Notes (Signed)
Pt has no chest pain.  Denies having any chest pain.  Per EDP, patient given nitro for hypertension and fluid overload.

## 2017-02-13 NOTE — Procedures (Signed)
Patient was seen on dialysis and the procedure was supervised.  BFR 400  Via PC BP is  160/70.   Patient appears to be tolerating treatment well- needs much volume removal   Valerie Terrell A 02/13/2017

## 2017-02-13 NOTE — ED Notes (Signed)
Dr. Dayna Barker not in his office venous blood gas result given to RN-Carla

## 2017-02-13 NOTE — ED Triage Notes (Signed)
Per EMS, patient was at dialysis and began having SOB.  Dialysis never started. Normally on 2L [place] all the time.  89% CPAP en route.  Hx of CHF, CKF, Pacemaker.  Albuterol given on seen..did not help.  206/109, 70 Paced, 89% CPAP, RR 23.

## 2017-02-13 NOTE — Progress Notes (Signed)
Patient arrived to unit by ED stretcher.  Reviewed treatment plan and this RN agrees with plan.  Report received from bedside RN, Marzetta Board.  Consent obtained per POA daughter.  Patient A & O X 4.   Lung sounds diminished to ausculation in all fields. Non pitting BLE edema. Cardiac:  A paced.  Removed caps and cleansed RIJ catheter with chlorhedxidine.  Aspirated ports of heparin and flushed them with saline per protocol.  Connected and secured lines, initiated treatment at 1806.  UF Goal of 5250 mL and net fluid removal 5 L.  Will continue to monitor.

## 2017-02-13 NOTE — H&P (Signed)
History and Physical    Valerie Terrell QAS:[id number] DOB: 1933/04/10 DOA: 02/13/2017  PCP: Tammi Sou, MD  Patient coming from: home  I have personally briefly reviewed patient's old medical records in [place]  Chief Complaint: shortness of breath  HPI: Valerie Terrell is a 83 y.o. female with medical history significant of end-stage renal disease on hemodialysis, chronic systolic congestive heart failure with EF of 35-40%, paroxysmal atrial fibrillation who presents to the emergency room with complaints of shortness of breath.patient reports she was in usual state of health this morning, she developed worsening shortness of breath. She does not have any chest pain. She reports being compliant with dialysis schedule. She has not had any cough, fever, vomiting, diarrhea, dysuria. She was sent to the emergency room for further evaluation.  ED Course: on arrival to the emergency room, she was noted to be in volume overload. BiPAP was applied to assist with respiratory distress. She was started on a nitroglycerin infusion. Labs indicated elevated potassium at 7.3.nephrology was consulted for urgent dialysis.She is being admitted for further evaluation  Review of Systems: As per HPI otherwise 10 point review of systems negative.    Past Medical History:  Diagnosis Date  . Anemia of chronic disease 2018   + anemia of CRI?  [name] Atrophy of left kidney    with absent blood flow by renal artery dopplers (Dr. Gwenlyn Found)  . 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 Chronic combined systolic and diastolic CHF (congestive heart failure) ([place])    a. 11/2016: echo showing EF of 35-40%, RV strain noted, mild MR and mild TR.   [name] Chronic hypoxemic respiratory failure ([place])   . Debilitated patient    WC dependent as of 2018  . ESRD on hemodialysis ([place]) 01/2017   T/Th/Sat schedule  . History of adenomatous polyp of colon   . History of subarachnoid hemorrhage 10/2014   after syncope  and while on xarelto  . Hyperlipidemia   . Hypertension    Difficult to control, in the setting of one functioning kidney: pt was referred to nephrology by Dr. Gwenlyn Found 06/2015.  . Lumbar radiculopathy 2012  . Lumbar spondylosis    MR 07/2016---no sign of spinal nerve compression or cord compression.  Pt set up with outpt ortho while admitted to hosp 07/2016.  . Malnourished ([place])   . Metatarsal fracture 06/10/2016   Nondisplaced, left 5th metatarsal--pt was referred to ortho  . Nonischemic cardiomyopathy ([place])   . Osteopenia 2014   T-score -2.1  . PAF (paroxysmal atrial fibrillation) (HCC)    Eliquis started after BRAO and CVA.  She was changed to warfarin 2018--INR/coumadin mgmt per HD/nephrol.  . Pulmonary edema 11/2016  . Right rib fracture 12/2016   s/p fall  . Stroke [facility])    cardioembolic (had CVA while on no anticoag)--"scattered subacute punctate infarcts: 1 in R parietal lobe and 2 in occipital cortex" on MRI br.  CT angio head/neck: aortic arch athero.   . Thoracic compression fracture ([place]) 07/2016   T7 and T8-- T8 kyphoplasty during hosp admission 07/2016.  Neuro referred pt to pain mgmt for consideration of injection 09/2016.  I referred her to endo 08/2016 for consideration of calcitonin treatment.    Past Surgical History:  Procedure Laterality Date  . APPENDECTOMY  child  . CARDIOVASCULAR STRESS TEST  01/31/2016   Stress myoview: NORMAL/Low risk.  EF 56%.  . [place]  . COLONOSCOPY  2015   +  hx of adenomatous polyps.  Need digest health spec in [place] records to see when pt due for next colonoscopy  . INSERTION OF DIALYSIS CATHETER Right 01/29/2017   Procedure: INSERTION OF DIALYSIS CATHETER- RIGHT INTERNAL JUGULAR;  Surgeon: [name], MD;  Location: [facility] OR;  Service: Vascular;  Laterality: Right;  . IR GENERIC HISTORICAL  07/24/2016   IR KYPHO THORACIC WITH BONE BIOPSY 07/24/2016 Luanne Bras, MD [facility]-INTERV RAD  . KYPHOPLASTY  07/27/2016   T8  .  PACEMAKER IMPLANT N/A 12/03/2016   Procedure: Pacemaker Implant;  Surgeon: Evans Lance, MD;  Location: [facility];  Service: Cardiovascular;  Laterality: N/A;  . Renal artery dopplers  02/26/2016   Her right renal dimension was 11 cm pole to pole with mild to moderate right renal artery stenosis. A right renal aortic ratio was 3.22 suggesting less than a 50% stenosis.  . TONSILLECTOMY    . TRANSTHORACIC ECHOCARDIOGRAM  01/29/2016; 11/2016   2017: EF 55-60%, normal LV wall motion, grade I DD.  No cardiac source of emboli was seen.  2018: EF 35-40%, could not assess DD due to a-fib, pulm HTN noted.     reports that she has never smoked. She has never used smokeless tobacco. She reports that she drinks about 0.6 oz of alcohol per week . She reports that she does not use drugs.  Allergies  Allergen Reactions  . Clonidine Derivatives Palpitations and Other (See Comments)    Very sedated  . Codeine Nausea Only  . Nickel Rash  . Sulfa Antibiotics Other (See Comments)    Reaction unknown    Family History  Problem Relation Age of Onset  . Cancer Mother   . Heart disease Father   . Early death Father   . Sudden Cardiac Death Neg Hx      Prior to Admission medications   Medication Sig Start Date End Date Taking? Authorizing Provider  amiodarone (PACERONE) 200 MG tablet Take 1 tablet (200 mg total) by mouth daily. 02/03/17  Yes Hongalgi, Lenis Dickinson, MD  amLODipine (NORVASC) 5 MG tablet Take 5 mg by mouth daily.   Yes [provider]  carvedilol (COREG) 25 MG tablet Take 1 tablet (25 mg total) by mouth 2 (two) times daily with a meal. 02/08/17  Yes McGowen, Adrian Blackwater, MD  folic acid (FOLVITE) 1 MG tablet Take 1 tablet (1 mg total) by mouth daily. 02/04/17  Yes Hongalgi, Lenis Dickinson, MD  isosorbide mononitrate (IMDUR) 60 MG 24 hr tablet Take 1 tablet (60 mg total) by mouth daily. 02/08/17  Yes McGowen, Adrian Blackwater, MD  Multiple Vitamin (MULTIVITAMIN) tablet Take 1 tablet by mouth 3  (three) times a week.    Yes [provider]  ondansetron (ZOFRAN) 4 MG tablet Take 0.5-1 tablets (2-4 mg total) by mouth every 8 (eight) hours as needed for nausea or vomiting. 12/10/16  Yes McGowen, Adrian Blackwater, MD  oxyCODONE-acetaminophen (PERCOCET/ROXICET) 5-325 MG tablet Take 1-2 tablets by mouth every 6 (six) hours as needed for severe pain. 12/31/16  Yes McGowen, Adrian Blackwater, MD  pravastatin (PRAVACHOL) 40 MG tablet Take 40 mg by mouth every evening. 07/16/15  Yes [provider]  Vitamin D, Ergocalciferol, (DRISDOL) 50000 units CAPS capsule 1 cap po q week x 12 weeks Patient taking differently: Take 50,000 Units by mouth every 7 (seven) days. For 12 weeks. (Thursdays) 11/09/16  Yes McGowen, Adrian Blackwater, MD  warfarin (COUMADIN) 3 MG tablet Take 1/2 to 1 tablet by mouth  daily as directed by coumadin clinic Patient taking differently: Take 1.5-3 mg by mouth See admin instructions. Take 1/2 tablet on Monday, Wednesday and Friday then take 1 tablet all the other days 12/21/16  Yes Lorretta Harp, MD  acetaminophen (TYLENOL) 500 MG tablet Take 500 mg by mouth every 6 (six) hours as needed for moderate pain.    [provider]  Calcium Carb-Cholecalciferol (CALCIUM/VITAMIN D PO) Take 1 tablet by mouth 2 (two) times daily.    [provider]  vitamin C (ASCORBIC ACID) 500 MG tablet Take 500 mg by mouth daily.    [provider]    Physical Exam: Vitals:   02/13/17 1715 02/13/17 1806 02/13/17 1830 02/13/17 1900  BP: (!) 154/66 (!) 164/70 (!) 134/59 126/61  Pulse: 60 60 63 73  Resp: 18     Temp:  98.3 F (36.8 C)    SpO2: 100%     Weight:  49.9 kg (110 lb 0.2 oz)    Height:        Constitutional: NAD, calm, comfortable Vitals:   02/13/17 1715 02/13/17 1806 02/13/17 1830 02/13/17 1900  BP: (!) 154/66 (!) 164/70 (!) 134/59 126/61  Pulse: 60 60 63 73  Resp: 18     Temp:  98.3 F (36.8 C)    SpO2: 100%     Weight:  49.9 kg (110 lb 0.2 oz)    Height:         Eyes: PERRL, lids and conjunctivae normal ENMT: Mucous membranes are moist. Posterior pharynx clear of any exudate or lesions.Normal dentition.  Neck: normal, supple, no masses, no thyromegaly Respiratory: diminished breath sounds with crackles at bases. Normal respiratory effort. No accessory muscle use.  On bipap Cardiovascular: Regular rate and rhythm, no murmurs / rubs / gallops. 1-2+ extremity edema. 2+ pedal pulses. No carotid bruits.  Abdomen: no tenderness, no masses palpated. No hepatosplenomegaly. Bowel sounds positive.  Musculoskeletal: no clubbing / cyanosis. No joint deformity upper and lower extremities. Good ROM, no contractures. Normal muscle tone.  Skin: no rashes, lesions, ulcers. No induration Neurologic: CN 2-12 grossly intact. Sensation intact, DTR normal. Strength 5/5 in all 4.  Psychiatric: Normal judgment and insight. Alert and oriented x 3. Normal mood.   Labs on Admission: I have personally reviewed following labs and imaging studies  CBC:  Recent Labs Lab 02/13/17 1357  WBC 10.9*  NEUTROABS 8.6*  HGB 13.2  HCT 42.6  MCV 97.7  PLT 188   Basic Metabolic Panel:  Recent Labs Lab 02/13/17 1357 02/13/17 1838  NA 134* 133*  K 7.3* 6.8*  CL 99* 98*  CO2 25 26  GLUCOSE 143* 109*  BUN 38* 40*  CREATININE 3.05* 2.95*  CALCIUM 9.2 8.7*  PHOS  --  6.7*   GFR: Estimated Creatinine Clearance: 10.2 mL/min (A) (by C-G formula based on SCr of 2.95 mg/dL (H)). Liver Function Tests:  Recent Labs Lab 02/13/17 1357 02/13/17 1838  AST 34  --   ALT 23  --   ALKPHOS 89  --   BILITOT 0.6  --   PROT 7.3  --   ALBUMIN 3.6 3.0*   No results for input(s): LIPASE, AMYLASE in the last 168 hours. No results for input(s): AMMONIA in the last 168 hours. Coagulation Profile: No results for input(s): INR, PROTIME in the last 168 hours. Cardiac Enzymes:  Recent Labs Lab 02/13/17 1357  TROPONINI 0.03*   BNP (last 3 results) No results for input(s): PROBNP  in the  last 8760 hours. HbA1C: No results for input(s): HGBA1C in the last 72 hours. CBG: No results for input(s): GLUCAP in the last 168 hours. Lipid Profile: No results for input(s): CHOL, HDL, LDLCALC, TRIG, CHOLHDL, LDLDIRECT in the last 72 hours. Thyroid Function Tests: No results for input(s): TSH, T4TOTAL, FREET4, T3FREE, THYROIDAB in the last 72 hours. Anemia Panel: No results for input(s): VITAMINB12, FOLATE, FERRITIN, TIBC, IRON, RETICCTPCT in the last 72 hours. Urine analysis:    Component Value Date/Time   COLORURINE YELLOW 01/20/2017 1737   APPEARANCEUR CLEAR 01/20/2017 1737   LABSPEC 1.012 01/20/2017 1737   PHURINE 5.0 01/20/2017 1737   GLUCOSEU NEGATIVE 01/20/2017 1737   HGBUR NEGATIVE 01/20/2017 1737   BILIRUBINUR NEGATIVE 01/20/2017 1737   BILIRUBINUR negative 09/24/2016 [place] 01/20/2017 1737   PROTEINUR 30 (A) 01/20/2017 1737   UROBILINOGEN 0.2 09/24/2016 1453   NITRITE NEGATIVE 01/20/2017 1737   LEUKOCYTESUR NEGATIVE 01/20/2017 1737    Radiological Exams on Admission: Dg Chest Portable 1 View  Result Date: 02/13/2017 CLINICAL DATA:  83 year old female with a history of shortness of breath EXAM: PORTABLE CHEST 1 VIEW COMPARISON:  01/29/2017 FINDINGS: Cardiomediastinal silhouette is unchanged, with cardiomegaly and partial obscuration of left heart border. Calcifications of the aortic arch. Increasing airspace opacity bilaterally, most pronounced on the right. Interlobular septal thickening and interstitial opacities. Unchanged left chest wall cardiac pacing device with 2 leads in place. Unchanged right IJ approach central hemodialysis catheter, appearing to terminate at the superior right atrium. Prior vertebral augmentation IMPRESSION: Interval worsening of airspace opacities, concerning for pulmonary edema. Infection cannot be excluded. Cardiomegaly. Unchanged right IJ hemodialysis catheter. Unchanged left sided chest wall cardiac pacing device  Electronically Signed   By: Corrie Mckusick D.O.   On: 02/13/2017 14:04    EKG: Independently reviewed. Ventricular paced rhythm  Assessment/Plan Active Problems:   Essential hypertension   Hyperlipidemia   Elevated troponin level   Atrial fibrillation (HCC) [I48.91]   Acute on chronic systolic congestive heart failure (HCC)   ESRD (end stage renal disease) (HCC)   Acute respiratory failure (HCC)   Hyperkalemia   1. Acute respiratory failure with hypoxia related to volume overload. Currently on BiPAP therapy. We'll try to wean off oxygen as volume is removed. 2. End-stage renal disease on hemodialysis. Nephrology following She is currently undergoing dialysis. 3. Hyperkalemia . Related to renal disease. Should correct after dialysis. 4. Acute on chronic systolic congestive heart failure. Volume removal as per nephrology. 5. Paroxysmal atrial fibrillation. Status post permanent pacemaker. Heart rate is stable. She is anticoagulated with Coumadin. 6. Mildly elevated troponin levels. Suspect this is demand ischemia in the setting of decompensated CHF. She's not having any chest pain. Continue to cycle troponins. 7. Hypertension. Continue home antihypertensives 8. Hyperlipidemia. Continue statin  DVT prophylaxis: Coumadin Code Status: full code Family Communication: no family present Disposition Plan: discharge home once improved Consults called: nephrology/[place] Admission status: inpatient, stepdown   Eathan Groman MD [facility] Pager [phone]  If 7PM-7AM, please contact night-coverage [email or web address] Password [facility]  02/13/2017, 7:35 PM

## 2017-02-13 NOTE — Progress Notes (Signed)
Pt weaned off of bi-pap, now on 4L [place] which is what the pt wears at home.  O2 sats 100%.  Will cont to monitor via cont p.ox.

## 2017-02-13 NOTE — ED Notes (Signed)
Unsuccessful attempt at giving report to 4E.

## 2017-02-14 DIAGNOSIS — J810 Acute pulmonary edema: Secondary | ICD-10-CM

## 2017-02-14 DIAGNOSIS — N186 End stage renal disease: Secondary | ICD-10-CM | POA: Diagnosis not present

## 2017-02-14 DIAGNOSIS — Z992 Dependence on renal dialysis: Secondary | ICD-10-CM | POA: Diagnosis not present

## 2017-02-14 LAB — RENAL FUNCTION PANEL
ALBUMIN: 3.5 g/dL (ref 3.5–5.0)
ANION GAP: 11 (ref 5–15)
BUN: 7 mg/dL (ref 6–20)
CO2: 29 mmol/L (ref 22–32)
CREATININE: 1.44 mg/dL — AB (ref 0.44–1.00)
Calcium: 9.5 mg/dL (ref 8.9–10.3)
Chloride: 95 mmol/L — ABNORMAL LOW (ref 101–111)
GFR, EST AFRICAN AMERICAN: 38 mL/min — AB (ref >60)
GFR, EST NON AFRICAN AMERICAN: 33 mL/min — AB (ref >60)
Glucose, Bld: 129 mg/dL — ABNORMAL HIGH (ref 65–99)
Phosphorus: 2.9 mg/dL (ref 2.5–4.6)
Potassium: 4.2 mmol/L (ref 3.5–5.1)
SODIUM: 135 mmol/L (ref 135–145)

## 2017-02-14 LAB — TROPONIN I
Troponin I: 0.05 ng/mL (ref <0.03)
Troponin I: 0.04 ng/mL (ref <0.03)
Troponin I: 0.04 ng/mL (ref <0.03)

## 2017-02-14 LAB — CREATININE, SERUM
CREATININE: 1.45 mg/dL — AB (ref 0.44–1.00)
GFR, EST AFRICAN AMERICAN: 38 mL/min — AB (ref >60)
GFR, EST NON AFRICAN AMERICAN: 33 mL/min — AB (ref >60)

## 2017-02-14 LAB — PROTIME-INR
INR: 2.15
Prothrombin Time: 23.9 seconds — ABNORMAL HIGH (ref 11.4–15.2)

## 2017-02-14 LAB — CBC
HCT: 39.2 % (ref 36.0–46.0)
Hemoglobin: 11.9 g/dL — ABNORMAL LOW (ref 12.0–15.0)
MCH: 29.5 pg (ref 26.0–34.0)
MCHC: 30.4 g/dL (ref 30.0–36.0)
MCV: 97.3 fL (ref 78.0–100.0)
Platelets: 200 10*3/uL (ref 150–400)
RBC: 4.03 MIL/uL (ref 3.87–5.11)
RDW: 17.3 % — AB (ref 11.5–15.5)
WBC: 9.1 10*3/uL (ref 4.0–10.5)

## 2017-02-14 MED ORDER — WARFARIN SODIUM 3 MG PO TABS
1.5000 mg | ORAL_TABLET | ORAL | Status: DC
  Administered 2017-02-15 – 2017-02-17 (×2): 1.5 mg via ORAL
  Filled 2017-02-14 (×3): qty 2

## 2017-02-14 MED ORDER — WARFARIN SODIUM 3 MG PO TABS
3.0000 mg | ORAL_TABLET | Freq: Once | ORAL | Status: AC
  Administered 2017-02-14: 3 mg via ORAL
  Filled 2017-02-14: qty 1

## 2017-02-14 MED ORDER — WARFARIN - PHARMACIST DOSING INPATIENT
Freq: Every day | Status: DC
  Administered 2017-02-14 – 2017-02-15 (×2)

## 2017-02-14 MED ORDER — POLYETHYLENE GLYCOL 3350 17 G PO PACK
17.0000 g | PACK | Freq: Two times a day (BID) | ORAL | Status: DC
  Administered 2017-02-14 – 2017-02-17 (×2): 17 g via ORAL
  Filled 2017-02-14 (×9): qty 1

## 2017-02-14 MED ORDER — WARFARIN SODIUM 3 MG PO TABS
3.0000 mg | ORAL_TABLET | ORAL | Status: DC
  Administered 2017-02-14 – 2017-02-16 (×2): 3 mg via ORAL
  Filled 2017-02-14 (×2): qty 1

## 2017-02-14 NOTE — Plan of Care (Signed)
Problem: Pain Managment: Goal: General experience of comfort will improve Outcome: Progressing Denies pain at this time

## 2017-02-14 NOTE — Progress Notes (Signed)
Subjective:  HD last night - removed 3500- did have drop in BP but it rebounded- is 130-140s today  Objective Vital signs in last 24 hours: Vitals:   02/13/17 2311 02/13/17 2345 02/14/17 0430 02/14/17 0801  BP: (!) 155/71 133/73 (!) 148/50 132/64  Pulse: 72 81 62 (!) 153  Resp: 19 20 19 16   Temp: 98.1 F (36.7 C) 98.6 F (37 C) 97.9 F (36.6 C)   TempSrc: Oral Oral Oral Oral  SpO2: 100% 93% 99% 96%  Weight: 46.3 kg (102 lb 1.2 oz)     Height: 4\' 11"  (1.499 m)      Weight change:   Intake/Output Summary (Last 24 hours) at 02/14/17 1151 Last data filed at 02/14/17 0800  Gross per 24 hour  Intake              358 ml  Output             3500 ml  Net            -3142 ml   Dialysis Orders:  [facility] TTS 4h 180F BFR 400/600 4K/2.25 Ca EDW 48.5kg  R IJ TDC Heparin 1400 U Bolus Venofer 100mg  IV q HD (until 10/18) Mircera 100 mcg IV q 2 weeks (last dosed 9/22)    Assessment/Plan: 1. Acute resp failure/pulmonary edema/CHF exacerbation - requiring BiPAP on admission, CXR with pulm edema, pitting edema on exam. HD last night for volume removal and K. much better. Frustrated by events. 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  Personally I think maybe we have not given it enough time and need to fix this EDW problem for her.  Plan HD tomorrow with another 3-4 liters goal    2. Hyperkalemia - K 7.3 on admission  corrected with HD. Follow trend.   last outpatient K was 6.3 and remained on 4K bath, will need to adjust when discharged.  3. ESRD - TTS. New HD start- has not gone well and she is aware.  I told her I usually tell people to give it a month  4.  Hypertension/volume  - BP elevated yesterday ,  d/t to volume overload. Improved with UF, hold BP meds while here and can establish her true EDW 5.  Anemia  - Hgb 13.2, on ESA/Fe bolus at OP center 6.  Metabolic bone disease -  No VDRA/binders yet. Follow renal panel here  7.  Nutrition - Alb 3.6. Renal  diet/vitamins 8. P Afib - on Coumadin 9. CM s/p PPM EF 35-40%  10. Dispo- pt aware of this not going well and used to work for hospice.  Sounds as if at best she lived alone but needed help with most things.  She does not want to give up but this has been frustrating for her - I told her to give [place] time to fix this particular EDW issue and see if she can improve from that.  If not then I trust her judgement     Clarence Cogswell A    Labs: Basic Metabolic Panel:  Recent Labs Lab 02/13/17 1357 02/13/17 1838 02/14/17 0023  NA 134* 133* 135  K 7.3* 6.8* 4.2  CL 99* 98* 95*  CO2 25 26 29   GLUCOSE 143* 109* 129*  BUN 38* 40* 7  CREATININE 3.05* 2.95* 1.45*  1.44*  CALCIUM 9.2 8.7* 9.5  PHOS  --  6.7* 2.9   Liver Function Tests:  Recent Labs Lab 02/13/17 1357  02/13/17 1838 02/14/17 0023  AST 34  --   --   ALT 23  --   --   ALKPHOS 89  --   --   BILITOT 0.6  --   --   PROT 7.3  --   --   ALBUMIN 3.6 3.0* 3.5   No results for input(s): LIPASE, AMYLASE in the last 168 hours. No results for input(s): AMMONIA in the last 168 hours. CBC:  Recent Labs Lab 02/13/17 1357 02/14/17 0023  WBC 10.9* 9.1  NEUTROABS 8.6*  --   HGB 13.2 11.9*  HCT 42.6 39.2  MCV 97.7 97.3  PLT 259 200   Cardiac Enzymes:  Recent Labs Lab 02/13/17 1357 02/14/17 0023 02/14/17 0803  TROPONINI 0.03* 0.05* 0.04*   CBG: No results for input(s): GLUCAP in the last 168 hours.  Iron Studies: No results for input(s): IRON, TIBC, TRANSFERRIN, FERRITIN in the last 72 hours. Studies/Results: Dg Chest Portable 1 View  Result Date: 02/13/2017 CLINICAL DATA:  83 year old female with a history of shortness of breath EXAM: PORTABLE CHEST 1 VIEW COMPARISON:  01/29/2017 FINDINGS: Cardiomediastinal silhouette is unchanged, with cardiomegaly and partial obscuration of left heart border. Calcifications of the aortic arch. Increasing airspace opacity bilaterally, most pronounced on the right.  Interlobular septal thickening and interstitial opacities. Unchanged left chest wall cardiac pacing device with 2 leads in place. Unchanged right IJ approach central hemodialysis catheter, appearing to terminate at the superior right atrium. Prior vertebral augmentation IMPRESSION: Interval worsening of airspace opacities, concerning for pulmonary edema. Infection cannot be excluded. Cardiomegaly. Unchanged right IJ hemodialysis catheter. Unchanged left sided chest wall cardiac pacing device Electronically Signed   By: Corrie Mckusick D.O.   On: 02/13/2017 14:04   Medications: Infusions: . sodium chloride    . sodium chloride    . sodium chloride      Scheduled Medications: . amiodarone  200 mg Oral Daily  . amLODipine  5 mg Oral Daily  . carvedilol  25 mg Oral BID WC  . folic acid  1 mg Oral Daily  . isosorbide mononitrate  60 mg Oral Daily  . polyethylene glycol  17 g Oral BID  . pravastatin  40 mg Oral QPM  . sodium chloride flush  3 mL Intravenous Q12H  . [START ON 02/15/2017] warfarin  1.5 mg Oral Q M,W,F-1800  . warfarin  3 mg Oral Q T,Th,S,Su-1800  . Warfarin - Pharmacist Dosing Inpatient   Does not apply q1800    have reviewed scheduled and prn medications.  Physical Exam: General: weak, frail Heart: RRR Lungs: prolonged exp- not much fluid appreciated  Abdomen: soft,non tender Extremities: peripheral edema much better  Dialysis Access: PC- according to notes [facility] deferred AVaccess to OP- maybe they wanted her stronger     02/14/2017,11:51 AM  LOS: 1 day

## 2017-02-14 NOTE — Progress Notes (Signed)
PROGRESS NOTE    Valerie Terrell  LKG:[id number] DOB: Aug 20, 1932 DOA: 02/13/2017 PCP: Tammi Sou, MD    Brief Narrative:  83 year old female who presented with shortness of breath. Patient does have significant past medical history of end-stage renal disease on hemodialysis, chronic systolic heart failure with ejection fraction 35-40%, and paroxysmal atrial fibrillation. Patient complained of acute development of significant dyspnea, no other associated symptoms. She recently started on hemodialysis, September 20, presented to dialysis Center with significant chest pain and dyspnea refractory to supplemental oxygen per nasal cannula,  On initial physical examination, she was in respiratory distress, oxygen saturation on 29%, immediately placed on noninvasive mechanical ventilation, her blood pressure 154/66, heart rate 60, respiratory rate 18, temperature 98.3, , moist mucous membranes, lungs had diminished breath sounds at bases with bibasilar rales, no wheezing, rales or rhonchi, heart S1-S2 present, regular, rhythmic, no crackles, rubs or murmurs, abdomen was soft, nontender, nondistended, positive bowel sounds, lower extremities +2+ edema. Sodium 134, potassium 7.3, chloride 99, bicarbonate 25, glucose 143, BUN 38, creatinine 3.05, BNP 1037, white count 10.9, hemoglobin 13.2, hematocrit 42.6, platelets 259. Chest x-ray with left rotation, bilateral interstitial and alveolar infiltrates, peribronchial cuffing, and fluid on the right fissure. Pacemaker in place, and right internal jugular vein hemodialysis catheter in place. EKG, rate of 70 bpm, 100% V paced, right axis deviation.   Patient was admitted to the hospital working diagnosis of acute hypoxic respiratory failure due to pulmonary edema due to volume overload, related to end-stage renal disease, complicated by hyperkalemia.  Assessment & Plan:   Active Problems:   Essential hypertension   Hyperlipidemia   Elevated troponin level   Atrial fibrillation (HCC) [I48.91]   Acute on chronic systolic congestive heart failure (HCC)   ESRD (end stage renal disease) (HCC)   Acute respiratory failure (HCC)   Hyperkalemia  1. Acute hypoxic respiratory failure due to pulmonary edema due to volume overload, related to end-stage renal disease. Improved volume status after ultrafiltration, no further respiratory distress, patient of bipap, oxymetry 99% on 4 LPM per nasal canula. Will continue to follow nephrology recommendations for further ultrafiltration.    2. End-stage renal disease on hemodialysis, complicated by hyperkalemia. Patient received urgent renal replacement therapy per HD on admission, with improvement of volume status and electrolytes, K down to 4,2 this am. Will continue to follow on nephrology recommendations.   3. Paroxysmal atrial fibrillation. Rate controlled, patient is sp pacemaker, will continue telemetry monitoring. Continue amiodarone and anticoagulation with warfarin.   4. Hypertension. Blood pressure systolic down to 92, will continue to hold antihypertensive agents. Patient on isosorbide.   5. Dyslipidemia. Continue statin therapy, with pravastatin.    DVT prophylaxis: warfarin  Code Status: full Family Communication: I spoke with patient's daughter at the bedside and all questions were addressed.  Disposition Plan:  SNF   Consultants:   Nephrology   Procedures:     Antimicrobials:       Subjective: Patient feeling very weak and deconditioned, dyspnea has improved, no nausea or vomiting ,no chest pain. Concerned of recurrence of volume overload.   Objective: Vitals:   02/13/17 2311 02/13/17 2345 02/14/17 0430 02/14/17 0801  BP: (!) 155/71 133/73 (!) 148/50 132/64  Pulse: 72 81 62 (!) 153  Resp: 19 20 19 16   Temp: 98.1 F (36.7 C) 98.6 F (37 C) 97.9 F (36.6 C)   TempSrc: Oral Oral Oral Oral  SpO2: 100% 93% 99% 96%  Weight: 46.3 kg (102 lb 1.2 oz)  Height: 4\' 11"  (1.499 m)        Intake/Output Summary (Last 24 hours) at 02/14/17 1120 Last data filed at 02/14/17 0800  Gross per 24 hour  Intake              358 ml  Output             3500 ml  Net            -3142 ml   Filed Weights   02/13/17 1349 02/13/17 1806 02/13/17 2311  Weight: 49.9 kg (110 lb) 49.9 kg (110 lb 0.2 oz) 46.3 kg (102 lb 1.2 oz)    Examination:  General: Not in pain or dyspnea, deconditioned Neurology: Awake and alert, non focal  E ENT: mild pallor, no icterus, oral mucosa moist Cardiovascular: No JVD. S1-S2 present, rhythmic, no gallops, rubs, or murmurs. Trace lower extremity edema. Pulmonary: decreased breath sounds bilaterally, adequate air movement, no wheezing, rhonchi. Positive rales at bases. Gastrointestinal. Abdomen flat, no organomegaly, non tender, no rebound or guarding Skin. No rashes Musculoskeletal: no joint deformities     Data Reviewed: I have personally reviewed following labs and imaging studies  CBC:  Recent Labs Lab 02/13/17 1357 02/14/17 0023  WBC 10.9* 9.1  NEUTROABS 8.6*  --   HGB 13.2 11.9*  HCT 42.6 39.2  MCV 97.7 97.3  PLT 259 614   Basic Metabolic Panel:  Recent Labs Lab 02/13/17 1357 02/13/17 1838 02/14/17 0023  NA 134* 133* 135  K 7.3* 6.8* 4.2  CL 99* 98* 95*  CO2 25 26 29   GLUCOSE 143* 109* 129*  BUN 38* 40* 7  CREATININE 3.05* 2.95* 1.45*  1.44*  CALCIUM 9.2 8.7* 9.5  PHOS  --  6.7* 2.9   GFR: Estimated Creatinine Clearance: 20.9 mL/min (A) (by C-G formula based on SCr of 1.44 mg/dL (H)). Liver Function Tests:  Recent Labs Lab 02/13/17 1357 02/13/17 1838 02/14/17 0023  AST 34  --   --   ALT 23  --   --   ALKPHOS 89  --   --   BILITOT 0.6  --   --   PROT 7.3  --   --   ALBUMIN 3.6 3.0* 3.5   No results for input(s): LIPASE, AMYLASE in the last 168 hours. No results for input(s): AMMONIA in the last 168 hours. Coagulation Profile:  Recent Labs Lab 02/14/17 0023  INR 2.15   Cardiac Enzymes:  Recent  Labs Lab 02/13/17 1357 02/14/17 0023 02/14/17 0803  TROPONINI 0.03* 0.05* 0.04*   BNP (last 3 results) No results for input(s): PROBNP in the last 8760 hours. HbA1C: No results for input(s): HGBA1C in the last 72 hours. CBG: No results for input(s): GLUCAP in the last 168 hours. Lipid Profile: No results for input(s): CHOL, HDL, LDLCALC, TRIG, CHOLHDL, LDLDIRECT in the last 72 hours. Thyroid Function Tests: No results for input(s): TSH, T4TOTAL, FREET4, T3FREE, THYROIDAB in the last 72 hours. Anemia Panel: No results for input(s): VITAMINB12, FOLATE, FERRITIN, TIBC, IRON, RETICCTPCT in the last 72 hours.    Radiology Studies: I have reviewed all of the imaging during this hospital visit personally     Scheduled Meds: . amiodarone  200 mg Oral Daily  . amLODipine  5 mg Oral Daily  . carvedilol  25 mg Oral BID WC  . folic acid  1 mg Oral Daily  . isosorbide mononitrate  60 mg Oral Daily  . polyethylene glycol  17 g Oral BID  .  pravastatin  40 mg Oral QPM  . sodium chloride flush  3 mL Intravenous Q12H  . [START ON 02/15/2017] warfarin  1.5 mg Oral Q M,W,F-1800  . warfarin  3 mg Oral Q T,Th,S,Su-1800  . Warfarin - Pharmacist Dosing Inpatient   Does not apply q1800   Continuous Infusions: . sodium chloride    . sodium chloride    . sodium chloride       LOS: 1 day        Tawni Millers, MD [facility] Pager [phone]

## 2017-02-14 NOTE — Progress Notes (Signed)
ANTICOAGULATION CONSULT NOTE - Initial Consult  Pharmacy Consult for Warfarin  Indication: atrial fibrillation  Allergies  Allergen Reactions  . Clonidine Derivatives Palpitations and Other (See Comments)    Very sedated  . Codeine Nausea Only  . Nickel Rash  . Sulfa Antibiotics Other (See Comments)    Reaction unknown   Patient Measurements: Height: 4\' 11"  (149.9 cm) Weight: 102 lb 1.2 oz (46.3 kg) IBW/kg (Calculated) : 43.2  Vital Signs: Temp: 98.6 F (37 C) (09/29 2345) Temp Source: Oral (09/29 2345) BP: 133/73 (09/29 2345) Pulse Rate: 81 (09/29 2345)  Labs:  Recent Labs  02/13/17 1357 02/13/17 1838 02/14/17 0023  HGB 13.2  --  11.9*  HCT 42.6  --  39.2  PLT 259  --  200  LABPROT  --   --  23.9*  INR  --   --  2.15  CREATININE 3.05* 2.95*  --   TROPONINI 0.03*  --   --     Estimated Creatinine Clearance: 10.2 mL/min (A) (by C-G formula based on SCr of 2.95 mg/dL (H)).   Medical History: Past Medical History:  Diagnosis Date  . Anemia of chronic disease 2018   + anemia of CRI?  [name] Atrophy of left kidney    with absent blood flow by renal artery dopplers (Dr. Gwenlyn Found)  . 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 Chronic combined systolic and diastolic CHF (congestive heart failure) ([place])    a. 11/2016: echo showing EF of 35-40%, RV strain noted, mild MR and mild TR.   [name] Chronic hypoxemic respiratory failure ([place])   . Debilitated patient    WC dependent as of 2018  . ESRD on hemodialysis ([place]) 01/2017   T/Th/Sat schedule  . History of adenomatous polyp of colon   . History of subarachnoid hemorrhage 10/2014   after syncope and while on xarelto  . Hyperlipidemia   . Hypertension    Difficult to control, in the setting of one functioning kidney: pt was referred to nephrology by Dr. Gwenlyn Found 06/2015.  . Lumbar radiculopathy 2012  . Lumbar spondylosis    MR 07/2016---no sign of spinal nerve compression or cord compression.  Pt set up with outpt ortho  while admitted to hosp 07/2016.  . Malnourished ([place])   . Metatarsal fracture 06/10/2016   Nondisplaced, left 5th metatarsal--pt was referred to ortho  . Nonischemic cardiomyopathy ([place])   . Osteopenia 2014   T-score -2.1  . PAF (paroxysmal atrial fibrillation) (HCC)    Eliquis started after BRAO and CVA.  She was changed to warfarin 2018--INR/coumadin mgmt per HD/nephrol.  . Pulmonary edema 11/2016  . Right rib fracture 12/2016   s/p fall  . Stroke [facility])    cardioembolic (had CVA while on no anticoag)--"scattered subacute punctate infarcts: 1 in R parietal lobe and 2 in occipital cortex" on MRI br.  CT angio head/neck: aortic arch athero.   . Thoracic compression fracture ([place]) 07/2016   T7 and T8-- T8 kyphoplasty during hosp admission 07/2016.  Neuro referred pt to pain mgmt for consideration of injection 09/2016.  I referred her to endo 08/2016 for consideration of calcitonin treatment.   Assessment: 83 y/o F on warfarin PTA for hx afib/CVA. INR tonight is therapeutic at 2.15. Hgb 11.9. ERSD on HD.   Warfarin PTA dosing: 1.5 mg on MWF, 3 mg all other days  Goal of Therapy:  INR 2-3 Monitor platelets by anticoagulation protocol: Yes   Plan:  Warfarin per home regimen  Daily PT/INR Monitor for bleeding   Narda Bonds 02/14/2017,1:19 AM

## 2017-02-15 ENCOUNTER — Other Ambulatory Visit: Payer: Self-pay

## 2017-02-15 LAB — BASIC METABOLIC PANEL
Anion gap: 9 (ref 5–15)
BUN: 26 mg/dL — AB (ref 6–20)
CHLORIDE: 94 mmol/L — AB (ref 101–111)
CO2: 30 mmol/L (ref 22–32)
Calcium: 9.2 mg/dL (ref 8.9–10.3)
Creatinine, Ser: 2.87 mg/dL — ABNORMAL HIGH (ref 0.44–1.00)
GFR calc Af Amer: 17 mL/min — ABNORMAL LOW (ref >60)
GFR calc non Af Amer: 14 mL/min — ABNORMAL LOW (ref >60)
GLUCOSE: 94 mg/dL (ref 65–99)
POTASSIUM: 5 mmol/L (ref 3.5–5.1)
Sodium: 133 mmol/L — ABNORMAL LOW (ref 135–145)

## 2017-02-15 LAB — POCT I-STAT, CHEM 8
BUN: 14 mg/dL (ref 6–20)
CALCIUM ION: 1.2 mmol/L (ref 1.15–1.40)
CREATININE: 1.1 mg/dL — AB (ref 0.44–1.00)
Chloride: 93 mmol/L — ABNORMAL LOW (ref 101–111)
GLUCOSE: 113 mg/dL — AB (ref 65–99)
HCT: 44 % (ref 36.0–46.0)
HEMOGLOBIN: 15 g/dL (ref 12.0–15.0)
POTASSIUM: 3.4 mmol/L — AB (ref 3.5–5.1)
Sodium: 136 mmol/L (ref 135–145)
TCO2: 30 mmol/L (ref 22–32)

## 2017-02-15 LAB — PROTIME-INR
INR: 2.41
Prothrombin Time: 26.1 seconds — ABNORMAL HIGH (ref 11.4–15.2)

## 2017-02-15 MED ORDER — BOOST / RESOURCE BREEZE PO LIQD
1.0000 | Freq: Two times a day (BID) | ORAL | Status: DC
  Administered 2017-02-15 – 2017-02-18 (×4): 1 via ORAL

## 2017-02-15 MED ORDER — CARVEDILOL 3.125 MG PO TABS
3.1250 mg | ORAL_TABLET | Freq: Two times a day (BID) | ORAL | Status: DC
  Administered 2017-02-15 – 2017-02-18 (×6): 3.125 mg via ORAL
  Filled 2017-02-15 (×6): qty 1

## 2017-02-15 MED ORDER — BOOST / RESOURCE BREEZE PO LIQD
1.0000 | Freq: Once | ORAL | Status: AC
  Administered 2017-02-15: 1 via ORAL

## 2017-02-15 MED ORDER — NEPRO/CARBSTEADY PO LIQD: 237.0000 mL | ORAL | Status: DC

## 2017-02-15 MED ORDER — HEPARIN SODIUM (PORCINE) 1000 UNIT/ML DIALYSIS: 20.0000 [IU]/kg | INTRAMUSCULAR | Status: DC | PRN

## 2017-02-15 MED ORDER — ACETAMINOPHEN 325 MG PO TABS
ORAL_TABLET | ORAL | Status: AC
  Filled 2017-02-15: qty 2

## 2017-02-15 MED ORDER — NYSTATIN 100000 UNIT/GM EX POWD
Freq: Two times a day (BID) | CUTANEOUS | Status: DC
  Administered 2017-02-15 – 2017-02-18 (×5): via TOPICAL
  Filled 2017-02-15: qty 15

## 2017-02-15 NOTE — Progress Notes (Signed)
Valerie Terrell for Warfarin  Indication: atrial fibrillation  Allergies  Allergen Reactions  . Clonidine Derivatives Palpitations and Other (See Comments)    Very sedated  . Codeine Nausea Only  . Nickel Rash  . Sulfa Antibiotics Other (See Comments)    Reaction unknown   Patient Measurements: Height: 4\' 11"  (149.9 cm) Weight: 104 lb 12.8 oz (47.5 kg) IBW/kg (Calculated) : 43.2  Vital Signs: Temp: 97.9 F (36.6 C) (10/01 0410) Temp Source: Oral (10/01 0410) BP: 158/73 (10/01 0012) Pulse Rate: 63 (10/01 0012)  Labs:  Recent Labs  02/13/17 1357 02/13/17 1838 02/14/17 0023 02/14/17 0803 02/14/17 1300 02/15/17 0211  HGB 13.2  --  11.9*  --   --   --   HCT 42.6  --  39.2  --   --   --   PLT 259  --  200  --   --   --   LABPROT  --   --  23.9*  --   --  26.1*  INR  --   --  2.15  --   --  2.41  CREATININE 3.05* 2.95* 1.45*  1.44*  --   --  2.87*  TROPONINI 0.03*  --  0.05* 0.04* 0.04*  --     Estimated Creatinine Clearance: 10.5 mL/min (A) (by C-G formula based on SCr of 2.87 mg/dL (H)).   Medical History: Past Medical History:  Diagnosis Date  . Anemia of chronic disease 2018   + anemia of CRI?  [name] Atrophy of left kidney    with absent blood flow by renal artery dopplers (Dr. Gwenlyn Found)  . 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 Chronic combined systolic and diastolic CHF (congestive heart failure) ([place])    a. 11/2016: echo showing EF of 35-40%, RV strain noted, mild MR and mild TR.   [name] Chronic hypoxemic respiratory failure ([place])   . Debilitated patient    WC dependent as of 2018  . ESRD on hemodialysis ([place]) 01/2017   T/Th/Sat schedule  . History of adenomatous polyp of colon   . History of subarachnoid hemorrhage 10/2014   after syncope and while on xarelto  . Hyperlipidemia   . Hypertension    Difficult to control, in the setting of one functioning kidney: pt was referred to nephrology by Dr. Gwenlyn Found 06/2015.  .  Lumbar radiculopathy 2012  . Lumbar spondylosis    MR 07/2016---no sign of spinal nerve compression or cord compression.  Pt set up with outpt ortho while admitted to hosp 07/2016.  . Malnourished ([name])   . Metatarsal fracture 06/10/2016   Nondisplaced, left 5th metatarsal--pt was referred to ortho  . Nonischemic cardiomyopathy ([name])   . Osteopenia 2014   T-score -2.1  . PAF (paroxysmal atrial fibrillation) (HCC)    Eliquis started after BRAO and CVA.  She was changed to warfarin 2018--INR/coumadin mgmt per HD/nephrol.  . Pulmonary edema 11/2016  . Right rib fracture 12/2016   s/p fall  . Stroke [facility])    cardioembolic (had CVA while on no anticoag)--"scattered subacute punctate infarcts: 1 in R parietal lobe and 2 in occipital cortex" on MRI br.  CT angio head/neck: aortic arch athero.   . Thoracic compression fracture ([name]) 07/2016   T7 and T8-- T8 kyphoplasty during hosp admission 07/2016.  Neuro referred pt to pain mgmt for consideration of injection 09/2016.  I referred her to endo 08/2016 for consideration of calcitonin treatment.   Assessment: 83  y/o F on warfarin PTA for hx afib/CVA. INR is therapeutic at 2.41. Hgb 11.9. ERSD on HD.   Warfarin PTA dosing: 1.5 mg on MWF, 3 mg all other days  Goal of Therapy:  INR 2-3 Monitor platelets by anticoagulation protocol: Yes   Plan:  1. Warfarin per home regimen  2. Daily PT/INR 3. Monitor for bleeding  [name], PharmD, BCPS 02/15/2017, 8:17 AM

## 2017-02-15 NOTE — Patient Outreach (Signed)
[facility]) Care Management  02/15/2017  Jermany Sundell 1933/03/30 [id number]   RNCM received notification of EMMI red flag on 9/28. Client currently hospitalized. Therefore no call for follow up on this flag.  Plan: RNCM will update hospital liaison.  Thea Silversmith, RN, MSN, [place] Coordinator Cell: [phone]

## 2017-02-15 NOTE — Progress Notes (Addendum)
PROGRESS NOTE    Valerie Terrell  RSW:[id number] DOB: 03-25-1933 DOA: 02/13/2017 PCP: Tammi Sou, MD    Brief Narrative:  83 year old female who presented with shortness of breath. Patient does have significant past medical history of end-stage renal disease on hemodialysis, chronic systolic heart failure with ejection fraction 35-40%, and paroxysmal atrial fibrillation. Patient complained of acute development of significant dyspnea, no other associated symptoms. She recently started on hemodialysis, September 20, presented to dialysis Center with significant chest pain and dyspnea refractory to supplemental oxygen per nasal cannula,  On initial physical examination, she was in respiratory distress, oxygen saturation on 29%, immediately placed on noninvasive mechanical ventilation, her blood pressure 154/66, heart rate 60, respiratory rate 18, temperature 98.3, , moist mucous membranes, lungs had diminished breath sounds at bases with bibasilar rales, no wheezing, rales or rhonchi, heart S1-S2 present, regular, rhythmic, no crackles, rubs or murmurs, abdomen was soft, nontender, nondistended, positive bowel sounds, lower extremities +2+ edema. Sodium 134, potassium 7.3, chloride 99, bicarbonate 25, glucose 143, BUN 38, creatinine 3.05, BNP 1037, white count 10.9, hemoglobin 13.2, hematocrit 42.6, platelets 259. Chest x-ray with left rotation, bilateral interstitial and alveolar infiltrates, peribronchial cuffing, and fluid on the right fissure. Pacemaker in place, and right internal jugular vein hemodialysis catheter in place. EKG, rate of 70 bpm, 100% V paced, right axis deviation.   Patient was admitted to the hospital working diagnosis of acute hypoxic respiratory failure due to pulmonary edema due to volume overload, related to end-stage renal disease, complicated by hyperkalemia.   Assessment & Plan:   Active Problems:   Essential hypertension   Hyperlipidemia   Elevated troponin  level   Atrial fibrillation (HCC) [I48.91]   Acute on chronic systolic congestive heart failure (HCC)   ESRD (end stage renal disease) (HCC)   Acute respiratory failure (HCC)   Hyperkalemia  1. Acute hypoxic respiratory failure due to pulmonary edema due to volume overload, related to end-stage renal disease. Patient had ultrafiltration, today for the 3rd consecutive day, positive weight loss, 5 Kg since admission. Feeling very weak and deconditioned. Will continue oxymetry monitoring and supplemental 02 per [place]. Oxymetry 99 % on 4 Lpm per [place].   2. End-stage renal disease on hemodialysis, complicated by hyperkalemia. Improved volume status, no signs of uremia, will continue to follow with nephrology recommendations. Adjusting renal replacement therapy for optimal results.   3. Systolic heart failure, acute on chronic. Old records personally reviewed, echocardiography with left ventricle ejection fraction 35 to 40%, will resume low dose coreg, continue ultrafiltration per nephrology recommendations. Peak pulmonary pressure 47 with moderately dilated RV, cw core pulmonale.   4. Paroxysmal atrial fibrillation. Continue rate controlled, sp pacemaker. On telemetry monitoring. Continue medical therapy with amiodarone and anticoagulation with warfarin, INR 2,4, will follow on pharmacy protocol.   5. Hypertension. Blood pressure systolic 093 to 818, will continue to hold antihypertensive medications, at home on amlodipine and isosorbide.   6. Dyslipidemia. Continue  pravastatin.     DVT prophylaxis: warfarin  Code Status: full Family Communication: I spoke with patient's daughter at the bedside and all questions were addressed.  Disposition Plan:  SNF   Consultants:   Nephrology   Procedures:     Antimicrobials:      Subjective: Patient is sp HD, feeling very deconditioned and weak, dyspnea has improved, no chest pain, no nausea or vomiting.    Objective: Vitals:    02/15/17 1030 02/15/17 1100 02/15/17 1130 02/15/17 1200  BP: 136/82 131/74 127/74 [name])  110/58  Pulse: 84 81 85 84  Resp: 18 19 18 19   Temp:    98.2 F (36.8 C)  TempSrc:    Oral  SpO2:  98%  99%  Weight:    45.2 kg (99 lb 10.4 oz)  Height:        Intake/Output Summary (Last 24 hours) at 02/15/17 1610 Last data filed at 02/15/17 1200  Gross per 24 hour  Intake              240 ml  Output             2500 ml  Net            -2260 ml   Filed Weights   02/15/17 0226 02/15/17 0800 02/15/17 1200  Weight: 47.5 kg (104 lb 12.8 oz) 47.5 kg (104 lb 11.5 oz) 45.2 kg (99 lb 10.4 oz)    Examination:  General: deconditioned and ill looking appearing Neurology: Awake and alert, non focal,  E ENT: positive  pallor, no icterus, oral mucosa moist Cardiovascular: No JVD. S1-S2 present, rhythmic, no gallops, rubs, or murmurs. Trace lower extremity edema. Pulmonary: decreased breath sounds bilaterally at bases, adequate air movement apical zones, no wheezing, rhonchi or rales. Gastrointestinal. Abdomen flat, no organomegaly, non tender, no rebound or guarding Skin. No rashes Musculoskeletal: no joint deformities     Data Reviewed: I have personally reviewed following labs and imaging studies  CBC:  Recent Labs Lab 02/13/17 1357 02/13/17 1944 02/14/17 0023  WBC 10.9*  --  9.1  NEUTROABS 8.6*  --   --   HGB 13.2 15.0 11.9*  HCT 42.6 44.0 39.2  MCV 97.7  --  97.3  PLT 259  --  026   Basic Metabolic Panel:  Recent Labs Lab 02/13/17 1357 02/13/17 1838 02/13/17 1944 02/14/17 0023 02/15/17 0211  NA 134* 133* 136 135 133*  K 7.3* 6.8* 3.4* 4.2 5.0  CL 99* 98* 93* 95* 94*  CO2 25 26  --  29 30  GLUCOSE 143* 109* 113* 129* 94  BUN 38* 40* 14 7 26*  CREATININE 3.05* 2.95* 1.10* 1.45*  1.44* 2.87*  CALCIUM 9.2 8.7*  --  9.5 9.2  PHOS  --  6.7*  --  2.9  --    GFR: Estimated Creatinine Clearance: 10.5 mL/min (A) (by C-G formula based on SCr of 2.87 mg/dL (H)). Liver Function  Tests:  Recent Labs Lab 02/13/17 1357 02/13/17 1838 02/14/17 0023  AST 34  --   --   ALT 23  --   --   ALKPHOS 89  --   --   BILITOT 0.6  --   --   PROT 7.3  --   --   ALBUMIN 3.6 3.0* 3.5   No results for input(s): LIPASE, AMYLASE in the last 168 hours. No results for input(s): AMMONIA in the last 168 hours. Coagulation Profile:  Recent Labs Lab 02/14/17 0023 02/15/17 0211  INR 2.15 2.41   Cardiac Enzymes:  Recent Labs Lab 02/13/17 1357 02/14/17 0023 02/14/17 0803 02/14/17 1300  TROPONINI 0.03* 0.05* 0.04* 0.04*   BNP (last 3 results) No results for input(s): PROBNP in the last 8760 hours. HbA1C: No results for input(s): HGBA1C in the last 72 hours. CBG: No results for input(s): GLUCAP in the last 168 hours. Lipid Profile: No results for input(s): CHOL, HDL, LDLCALC, TRIG, CHOLHDL, LDLDIRECT in the last 72 hours. Thyroid Function Tests: No results for input(s): TSH, T4TOTAL, FREET4, T3FREE,  THYROIDAB in the last 72 hours. Anemia Panel: No results for input(s): VITAMINB12, FOLATE, FERRITIN, TIBC, IRON, RETICCTPCT in the last 72 hours.    Radiology Studies: I have reviewed all of the imaging during this hospital visit personally     Scheduled Meds: . acetaminophen      . amiodarone  200 mg Oral Daily  . feeding supplement  1 Container Oral Once  . feeding supplement  1 Container Oral BID BM  . feeding supplement (NEPRO CARB STEADY)  237 mL Oral Q24H  . folic acid  1 mg Oral Daily  . nystatin   Topical BID  . polyethylene glycol  17 g Oral BID  . pravastatin  40 mg Oral QPM  . sodium chloride flush  3 mL Intravenous Q12H  . warfarin  1.5 mg Oral Q M,W,F-1800  . warfarin  3 mg Oral Q T,Th,S,Su-1800  . Warfarin - Pharmacist Dosing Inpatient   Does not apply q1800   Continuous Infusions: . sodium chloride       LOS: 2 days        Tawni Millers, MD [facility] Pager [phone]

## 2017-02-15 NOTE — Progress Notes (Signed)
Initial Nutrition Assessment  DOCUMENTATION CODES:   Severe malnutrition in context of chronic illness  INTERVENTION:  - Provide Nepro Shake po once per day, each supplement provides 425 kcal and 19 grams protein - Provide Boost Breeze po BID, each supplement provides 250 kcal and 9 grams of protein - Promoted PO intake, if does not increase, recommend liberalizing diet r/t malnutrition status and dislike of food taste/options - Initiated diet education, full education not appropriate, provided hand out  NUTRITION DIAGNOSIS:   Malnutrition (severe) related to chronic illness (ESRD on HD, CHF) as evidenced by severe depletion of muscle mass, severe depletion of body fat.  GOAL:   Patient will meet greater than or equal to 90% of their needs  MONITOR:   PO intake, Supplement acceptance, Labs, Weight trends, I & O's  REASON FOR ASSESSMENT:   Consult Diet education  ASSESSMENT:   Pt with PMH of ESRD on HD, CHF, PAF, HTN, HLD, and osteopenia presents with worsening SOB   Per nephrology note, pt s/p extra HD today, for fluid removal. Pt reports fatigue. Pt net negative 5 L since admission.  Pt reports a decreased appetite over the past 45 days. Pt reports the food does not taste good thus impacts her intake. Pt reports disliking Nepro however can drink 1/2 of one a day. Pt interested in trying Boost Breeze.   Pt reports a decline in body weight. Per chart pt's weight has fluctuated, likely related to fluid status, how has trended downwards.   RD provided hand out on renal diet however full diet education not appropriate at this time given pt's state of fatigue and poor intake. Noted pt received diet education by RD 09/18.   Labs reviewed; Na 133 Medications reviewed; folic acid, coumadin, Miralax  Nutrition focused physical exam completed. Findings include moderate to severe fat depletions, moderate to severe muscle depletions and unable to assess edema.   Diet Order:  Diet  renal with fluid restriction Fluid restriction: 1200 mL Fluid; Room service appropriate? Yes; Fluid consistency: Thin  Skin:  Wound (see comment) (incision at R neck)  Last BM:  02/14/17  Height:   Ht Readings from Last 1 Encounters:  02/13/17 4\' 11"  (1.499 m)    Weight:   Wt Readings from Last 1 Encounters:  02/15/17 99 lb 10.4 oz (45.2 kg)    Ideal Body Weight:  44.7 kg  BMI:  Body mass index is 20.13 kg/m.  Estimated Nutritional Needs:   Kcal:  1761-6073  Protein:  70-85 grams  Fluid:  </= 1.2 L/d  EDUCATION NEEDS:   Education needs no appropriate at this time  Parks Ranger, MS, RDN, LDN 02/15/2017 3:20 PM

## 2017-02-15 NOTE — Progress Notes (Signed)
[facility] Progress Note   Dialysis Orders: [facility]  4h 180F   400/600 4K/2.25 Ca    48.5kg  R IJ TDC Heparin 1400 Venofer 100mg  IV q HD (until 10/18) Mircera 100 mcg IV q 2 weeks (last dosed 9/22)   Assessment/Plan: 1. ARDS/Pulmonary edema/CHF exacerbation  -Required BiPAP on admission, improved   -CXR 9/29: Interval worsening of airspace opacities, concerning for pulmonary edema. Infection cannot be excluded. Afebrile, WBC WNL.   -Extra HD today for fluid removal.  2. Hyperkalemia - improved. K 5.0 pre HD today.  Follow trend.   -Was on 4K bath as outpatient, w/K 6.3, needs adjustment for d/c. 3. ESRD -TTS - New start, 1 week of outpatient HD at [place].  -Extra IHD today for fluid removal. UF goal 3-4L. Orders written for regular HD tomorrow.  4. HTN/volume    -Improvement in volume status.  Extra HD today. Under EDW today, continue to titrate down volume and determine new EDW for D/C. 5. Anemia -   -Hgb stable 13.2, follow. 6. Secondary hyperparathyroidism -   -Ca and P in goal. Follow trends. No VRDA/binders yet. 7.  Nutrition - Alb 3.6  -Nepro, Renavite and Renal diet 8. A. Fib  -on coumadin, pharmacy following.  9 CM s/p PPM EF 35-40% 10. Fungal rash under left breast  -nystatin powder ordered   Jen Mow, PA-C [facility] 02/15/2017,11:41 AM  LOS: 2 days   Pt seen, examined and agree w A/P as above.  PT admitted with acute pulm edema, recent start to dialysis. Had 3L off on Sat and 2.5 L off today. Is now below prior dry wt, losing body wt.  Plan HD in am tomorrow to get back on schedule and should be ok for dc after that.  Will need lower dry wt.  Kelly Splinter MD [facility] pager [phone]   02/15/2017, 12:53 PM    Subjective:   Patient seen while on HD. Tolerating well. States she is feeling better, breathing is improved.  Denies CP, N/V/D, and edema.   Objective Vitals:   02/15/17 0805 02/15/17 0830  02/15/17 0900 02/15/17 0930  BP: (!) 170/65 (!) 172/69 (!) 145/60 134/67  Pulse: 60 60 72 73  Resp: 18 18 18 18   Temp:      TempSrc:      SpO2:    98%  Weight:      Height:       Physical Exam General:NAD, frail, elderly female Heart:RRR Chest: mildly tender, erythematous rash in crease under left breast Lungs:CTA b/l Abdomen:soft, NT Extremities:trace edema Dialysis Access: [facility]   Filed Weights   02/13/17 2311 02/15/17 0226 02/15/17 0800  Weight: 46.3 kg (102 lb 1.2 oz) 47.5 kg (104 lb 12.8 oz) 47.5 kg (104 lb 11.5 oz)    Intake/Output Summary (Last 24 hours) at 02/15/17 1141 Last data filed at 02/14/17 1800  Gross per 24 hour  Intake              365 ml  Output                0 ml  Net              365 ml    Additional Objective Labs: Basic Metabolic Panel:  Recent Labs Lab 02/13/17 1838 02/14/17 0023 02/15/17 0211  NA 133* 135 133*  K 6.8* 4.2 5.0  CL 98* 95* 94*  CO2 26 29 30   GLUCOSE 109* 129* 94  BUN 40* 7 26*  CREATININE 2.95* 1.45*  1.44* 2.87*  CALCIUM 8.7* 9.5 9.2  PHOS 6.7* 2.9  --    Liver Function Tests:  Recent Labs Lab 02/13/17 1357 02/13/17 1838 02/14/17 0023  AST 34  --   --   ALT 23  --   --   ALKPHOS 89  --   --   BILITOT 0.6  --   --   PROT 7.3  --   --   ALBUMIN 3.6 3.0* 3.5   No results for input(s): LIPASE, AMYLASE in the last 168 hours. CBC:  Recent Labs Lab 02/13/17 1357 02/14/17 0023  WBC 10.9* 9.1  NEUTROABS 8.6*  --   HGB 13.2 11.9*  HCT 42.6 39.2  MCV 97.7 97.3  PLT 259 200   Blood Culture    Component Value Date/Time   SDES BLOOD LEFT FOREARM 01/13/2017 1256   SPECREQUEST IN PEDIATRIC BOTTLE Blood Culture adequate volume 01/13/2017 1256   CULT NO GROWTH 5 DAYS 01/13/2017 1256   REPTSTATUS 01/18/2017 FINAL 01/13/2017 1256    Cardiac Enzymes:  Recent Labs Lab 02/13/17 1357 02/14/17 0023 02/14/17 0803 02/14/17 1300  TROPONINI 0.03* 0.05* 0.04* 0.04*   CBG: No results for input(s): GLUCAP in  the last 168 hours. Iron Studies: No results for input(s): IRON, TIBC, TRANSFERRIN, FERRITIN in the last 72 hours. Lab Results  Component Value Date   INR 2.41 02/15/2017   INR 2.15 02/14/2017   INR 3.0 (A) 02/06/2017   Studies/Results: Dg Chest Portable 1 View  Result Date: 02/13/2017 CLINICAL DATA:  83 year old female with a history of shortness of breath EXAM: PORTABLE CHEST 1 VIEW COMPARISON:  01/29/2017 FINDINGS: Cardiomediastinal silhouette is unchanged, with cardiomegaly and partial obscuration of left heart border. Calcifications of the aortic arch. Increasing airspace opacity bilaterally, most pronounced on the right. Interlobular septal thickening and interstitial opacities. Unchanged left chest wall cardiac pacing device with 2 leads in place. Unchanged right IJ approach central hemodialysis catheter, appearing to terminate at the superior right atrium. Prior vertebral augmentation IMPRESSION: Interval worsening of airspace opacities, concerning for pulmonary edema. Infection cannot be excluded. Cardiomegaly. Unchanged right IJ hemodialysis catheter. Unchanged left sided chest wall cardiac pacing device Electronically Signed   By: Corrie Mckusick D.O.   On: 02/13/2017 14:04    Medications: . sodium chloride    . sodium chloride    . sodium chloride     . amiodarone  200 mg Oral Daily  . folic acid  1 mg Oral Daily  . polyethylene glycol  17 g Oral BID  . pravastatin  40 mg Oral QPM  . sodium chloride flush  3 mL Intravenous Q12H  . warfarin  1.5 mg Oral Q M,W,F-1800  . warfarin  3 mg Oral Q T,Th,S,Su-1800  . Warfarin - Pharmacist Dosing Inpatient   Does not apply [phone]

## 2017-02-16 ENCOUNTER — Inpatient Hospital Stay (HOSPITAL_COMMUNITY): Payer: Medicare Other

## 2017-02-16 DIAGNOSIS — E8770 Fluid overload, unspecified: Secondary | ICD-10-CM

## 2017-02-16 LAB — BASIC METABOLIC PANEL
Anion gap: 13 (ref 5–15)
BUN: 20 mg/dL (ref 6–20)
CALCIUM: 9.4 mg/dL (ref 8.9–10.3)
CO2: 27 mmol/L (ref 22–32)
CREATININE: 2.8 mg/dL — AB (ref 0.44–1.00)
Chloride: 95 mmol/L — ABNORMAL LOW (ref 101–111)
GFR calc non Af Amer: 15 mL/min — ABNORMAL LOW (ref >60)
GFR, EST AFRICAN AMERICAN: 17 mL/min — AB (ref >60)
Glucose, Bld: 85 mg/dL (ref 65–99)
Potassium: 3.9 mmol/L (ref 3.5–5.1)
SODIUM: 135 mmol/L (ref 135–145)

## 2017-02-16 LAB — PROTIME-INR
INR: 2.48
PROTHROMBIN TIME: 26.7 s — AB (ref 11.4–15.2)

## 2017-02-16 LAB — PHOSPHORUS: Phosphorus: 6.7 mg/dL — ABNORMAL HIGH (ref 2.5–4.6)

## 2017-02-16 MED ORDER — LIDOCAINE-PRILOCAINE 2.5-2.5 % EX CREA: 1.0000 "application " | TOPICAL_CREAM | CUTANEOUS | Status: DC | PRN

## 2017-02-16 MED ORDER — NITROGLYCERIN 0.4 MG SL SUBL
SUBLINGUAL_TABLET | SUBLINGUAL | Status: AC
  Administered 2017-02-16: 0.4 mg
  Filled 2017-02-16: qty 2.5

## 2017-02-16 MED ORDER — OXYCODONE-ACETAMINOPHEN 5-325 MG PO TABS
1.0000 | ORAL_TABLET | Freq: Once | ORAL | Status: AC
  Administered 2017-02-16: 1 via ORAL
  Filled 2017-02-16: qty 1

## 2017-02-16 MED ORDER — OXYCODONE-ACETAMINOPHEN 5-325 MG PO TABS
ORAL_TABLET | ORAL | Status: AC
  Filled 2017-02-16: qty 1

## 2017-02-16 MED ORDER — SODIUM CHLORIDE 0.9 % IV SOLN: 100.0000 mL | INTRAVENOUS | Status: DC | PRN

## 2017-02-16 MED ORDER — PENTAFLUOROPROP-TETRAFLUOROETH EX AERO: 1.0000 "application " | INHALATION_SPRAY | CUTANEOUS | Status: DC | PRN

## 2017-02-16 MED ORDER — HEPARIN SODIUM (PORCINE) 1000 UNIT/ML DIALYSIS: 1000.0000 [IU] | INTRAMUSCULAR | Status: DC | PRN

## 2017-02-16 MED ORDER — LIDOCAINE HCL (PF) 1 % IJ SOLN: 5.0000 mL | INTRAMUSCULAR | Status: DC | PRN

## 2017-02-16 MED ORDER — HEPARIN SODIUM (PORCINE) 1000 UNIT/ML DIALYSIS
1400.0000 [IU] | Freq: Once | INTRAMUSCULAR | Status: AC
  Administered 2017-02-16: 1400 [IU] via INTRAVENOUS_CENTRAL

## 2017-02-16 MED ORDER — ALTEPLASE 2 MG IJ SOLR: 2.0000 mg | Freq: Once | INTRAMUSCULAR | Status: DC | PRN

## 2017-02-16 NOTE — Progress Notes (Signed)
Rationale: Heart failure patients with left ventricular systolic dysfunction (LVSD) and an EF < 40% should be prescribed an angiotensin converting enzyme inhibitor (ACEI) or angiotensin receptor blocker (ARB) at discharge unless a contraindication is documented in the medical record.  This patient is not currently on an ACEI or ARB for HF.  This note is being placed in the record in order to provide documentation that a contraindication to the use of these agents is present for this encounter.  ACE Inhibitor or Angiotensin Receptor Blocker is contraindicated (specify all that apply)  []   ACEI allergy AND ARB allergy []   Angioedema []   Moderate or severe aortic stenosis []   Hyperkalemia []   Hypotension []   Renal artery stenosis [x]   Worsening renal function, preexisting renal disease or dysfunction   [name], PharmD, BCPS 02/16/2017, 1:23 PM

## 2017-02-16 NOTE — Progress Notes (Signed)
[facility] for Warfarin  Indication: atrial fibrillation  Allergies  Allergen Reactions  . Clonidine Derivatives Palpitations and Other (See Comments)    Very sedated  . Codeine Nausea Only  . Nickel Rash  . Sulfa Antibiotics Other (See Comments)    Reaction unknown   Patient Measurements: Height: 4\' 11"  (149.9 cm) Weight: 101 lb 13.6 oz (46.2 kg) IBW/kg (Calculated) : 43.2  Vital Signs: Temp: 97.4 F (36.3 C) (10/02 0957) Temp Source: Oral (10/02 0957) BP: 114/59 (10/02 1241) Pulse Rate: 73 (10/02 1241)  Labs:  Recent Labs  02/13/17 1357  02/13/17 1944 02/14/17 0023 02/14/17 0803 02/14/17 1300 02/15/17 0211 02/16/17 0232  HGB 13.2  --  15.0 11.9*  --   --   --   --   HCT 42.6  --  44.0 39.2  --   --   --   --   PLT 259  --   --  200  --   --   --   --   LABPROT  --   --   --  23.9*  --   --  26.1* 26.7*  INR  --   --   --  2.15  --   --  2.41 2.48  CREATININE 3.05*  < > 1.10* 1.45*  1.44*  --   --  2.87* 2.80*  TROPONINI 0.03*  --   --  0.05* 0.04* 0.04*  --   --   < > = values in this interval not displayed.  Estimated Creatinine Clearance: 10.7 mL/min (A) (by C-G formula based on SCr of 2.8 mg/dL (H)).   Medical History: Past Medical History:  Diagnosis Date  . Anemia of chronic disease 2018   + anemia of CRI?  [name] Atrophy of left kidney    with absent blood flow by renal artery dopplers (Dr. Gwenlyn Found)  . 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 Chronic combined systolic and diastolic CHF (congestive heart failure) ([place])    a. 11/2016: echo showing EF of 35-40%, RV strain noted, mild MR and mild TR.   [name] Chronic hypoxemic respiratory failure ([place])   . Debilitated patient    WC dependent as of 2018  . ESRD on hemodialysis ([place]) 01/2017   T/Th/Sat schedule  . History of adenomatous polyp of colon   . History of subarachnoid hemorrhage 10/2014   after syncope and while on xarelto  . Hyperlipidemia   .  Hypertension    Difficult to control, in the setting of one functioning kidney: pt was referred to nephrology by Dr. Gwenlyn Found 06/2015.  . Lumbar radiculopathy 2012  . Lumbar spondylosis    MR 07/2016---no sign of spinal nerve compression or cord compression.  Pt set up with outpt ortho while admitted to hosp 07/2016.  . Malnourished ([place])   . Metatarsal fracture 06/10/2016   Nondisplaced, left 5th metatarsal--pt was referred to ortho  . Nonischemic cardiomyopathy ([place])   . Osteopenia 2014   T-score -2.1  . PAF (paroxysmal atrial fibrillation) (H[facility])    Eliquis started after BRAO and CVA.  She was changed to warfarin 2018--INR/coumadin mgmt per HD/nephrol.  . Pulmonary edema 11/2016  . Right rib fracture 12/2016   s/p fall  . Stroke [facility])    cardioembolic (had CVA while on no anticoag)--"scattered subacute punctate infarcts: 1 in R parietal lobe and 2 in occipital cortex" on MRI br.  CT angio head/neck: aortic arch athero.   . Thoracic  compression fracture ([place]) 07/2016   T7 and T8-- T8 kyphoplasty during hosp admission 07/2016.  Neuro referred pt to pain mgmt for consideration of injection 09/2016.  I referred her to endo 08/2016 for consideration of calcitonin treatment.   Assessment: 83 y/o F on warfarin PTA for hx afib/CVA. INR is therapeutic at 2.48. Hgb 11.9. ERSD on HD.   Warfarin PTA dosing: 1.5 mg on MWF, 3 mg all other days  Goal of Therapy:  INR 2-3 Monitor platelets by anticoagulation protocol: Yes   Plan:  1. Continue warfarin per home regimen  2. Daily PT/INR 3. Monitor for bleeding  [name], PharmD, BCPS 02/16/2017, 1:03 PM

## 2017-02-16 NOTE — Progress Notes (Addendum)
PROGRESS NOTE    Valerie Terrell  BJS:[id number] DOB: 07/21/1932 DOA: 02/13/2017 PCP: Tammi Sou, MD    Brief Narrative:  83 year old female who presented with shortness of breath. Patient does have significant past medical history of end-stage renal disease on hemodialysis, chronic systolic heart failure with ejection fraction 35-40%, and paroxysmal atrial fibrillation. Patient complained of acute development of significant dyspnea, no other associated symptoms. She recently started on hemodialysis, September 20, presented to dialysis Center with significant chest pain and dyspnea refractory to supplemental oxygen per nasal cannula,  On initial physical examination, she was in respiratory distress, oxygen saturation on 29%, immediately placed on noninvasive mechanical ventilation, her blood pressure 154/66, heart rate 60, respiratory rate 18, temperature 98.3, , moist mucous membranes, lungs had diminished breath sounds at bases with bibasilar rales, no wheezing, rales or rhonchi, heart S1-S2 present, regular, rhythmic, no crackles, rubs or murmurs, abdomen was soft, nontender, nondistended, positive bowel sounds, lower extremities +2+ edema. Sodium 134, potassium 7.3, chloride 99, bicarbonate 25, glucose 143, BUN 38, creatinine 3.05, BNP 1037, white count 10.9, hemoglobin 13.2, hematocrit 42.6, platelets 259. Chest x-ray with left rotation, bilateral interstitial and alveolar infiltrates, peribronchial cuffing, and fluid on the right fissure. Pacemaker in place, and right internal jugular vein hemodialysis catheter in place. EKG, rate of 70 bpm, 100% V paced, right axis deviation.   Patient was admitted to the hospital working diagnosis of acute hypoxic respiratory failure due to pulmonary edema due to volume overload, related to end-stage renal disease, complicated by hyperkalemia.   Assessment & Plan:   Active Problems:   Essential hypertension   Hyperlipidemia   Elevated troponin  level   Atrial fibrillation (HCC) [I48.91]   Acute on chronic systolic congestive heart failure (HCC)   ESRD (end stage renal disease) (HCC)   Acute respiratory failure (HCC)   Hyperkalemia  1. Acute hypoxic respiratory failure due to pulmonary edema due to volume overload, related to end-stage renal disease. Continue with ultrafiltration, positive weight loss, 8 Kg since admission. Feeling extremely weak and deconditioned. Will continue with nephrology recommendations.   2. End-stage renal disease on hemodialysis, complicatedby hyperkalemia.  No clinical signs of uremia, serum K at 3,9 with serum bicarbonate at 27.   3. Systolic heart failure, acute on chronic, left ventricle ejection fraction 35 to 40%, Continue coreg, as tolerated, negative fluid balance per ultrafiltration. Not on ace inh due to risk of hypotension. Continue telemetry monitoring.   4. Paroxysmal atrial fibrillation. Rate controlled with carvedilol and amiodarone, sp pacemaker. Continue telemetry monitoring and anticoagulation with warfarin, INR at 2.48, with good toleration.   5. Hypertension. Episodic hypotension, patient received IV fluids on HD today, will continue low dose coreg and close monitoring. Patient very weak and deconditioned, will need physical therapy evaluation.  6. Dyslipidemia. On pravastatin with good toleration.   7. Severe calorie protein malnutrition. Continue nutrition supplements.     DVT prophylaxis:warfarin Code Status:full Family Communication:I spoke with patient's daughter at the bedside and all questions were addressed.  Disposition Plan:SNF   Consultants:  Nephrology   Procedures:  Subjective: Patient sp HD very deconditioned and weak, no chest pain or dyspnea, no nausea or vomiting, not able to sleep overnight.   Objective: Vitals:   02/16/17 0930 02/16/17 0957 02/16/17 1045 02/16/17 1241  BP: (!) 111/48 (!) 152/76  (!) 114/59  Pulse: 84 81 83 73    Resp: (!) 25 (!) 24    Temp:  (!) 97.4 F (36.3 C)  TempSrc:  Oral    SpO2: 100% 100%    Weight:  46.2 kg (101 lb 13.6 oz)    Height:        Intake/Output Summary (Last 24 hours) at 02/16/17 1301 Last data filed at 02/16/17 0957  Gross per 24 hour  Intake            15.05 ml  Output             1100 ml  Net         -1084.95 ml   Filed Weights   02/15/17 1200 02/16/17 0656 02/16/17 0957  Weight: 45.2 kg (99 lb 10.4 oz) 44.2 kg (97 lb 7.1 oz) 46.2 kg (101 lb 13.6 oz)    Examination:  General: deconditioned and ill looking appearing Neurology: Awake and alert, non focal  E ENT: positive pallor, no icterus, oral mucosa moist Cardiovascular: No JVD. S1-S2 present, rhythmic, no gallops, rubs, or murmurs. Trace lower extremity edema. Pulmonary: decreased breath sounds bilaterally at bases, decreases air movement, with no wheezing, rhonchi, but bibasilar rales. Gastrointestinal. Abdomen flat, no organomegaly, non tender, no rebound or guarding Skin. No rashes Musculoskeletal: no joint deformities     Data Reviewed: I have personally reviewed following labs and imaging studies  CBC:  Recent Labs Lab 02/13/17 1357 02/13/17 1944 02/14/17 0023  WBC 10.9*  --  9.1  NEUTROABS 8.6*  --   --   HGB 13.2 15.0 11.9*  HCT 42.6 44.0 39.2  MCV 97.7  --  97.3  PLT 259  --  893   Basic Metabolic Panel:  Recent Labs Lab 02/13/17 1357 02/13/17 1838 02/13/17 1944 02/14/17 0023 02/15/17 0211 02/16/17 0232  NA 134* 133* 136 135 133* 135  K 7.3* 6.8* 3.4* 4.2 5.0 3.9  CL 99* 98* 93* 95* 94* 95*  CO2 25 26  --  29 30 27   GLUCOSE 143* 109* 113* 129* 94 85  BUN 38* 40* 14 7 26* 20  CREATININE 3.05* 2.95* 1.10* 1.45*  1.44* 2.87* 2.80*  CALCIUM 9.2 8.7*  --  9.5 9.2 9.4  PHOS  --  6.7*  --  2.9  --  6.7*   GFR: Estimated Creatinine Clearance: 10.7 mL/min (A) (by C-G formula based on SCr of 2.8 mg/dL (H)). Liver Function Tests:  Recent Labs Lab 02/13/17 1357  02/13/17 1838 02/14/17 0023  AST 34  --   --   ALT 23  --   --   ALKPHOS 89  --   --   BILITOT 0.6  --   --   PROT 7.3  --   --   ALBUMIN 3.6 3.0* 3.5   No results for input(s): LIPASE, AMYLASE in the last 168 hours. No results for input(s): AMMONIA in the last 168 hours. Coagulation Profile:  Recent Labs Lab 02/14/17 0023 02/15/17 0211 02/16/17 0232  INR 2.15 2.41 2.48   Cardiac Enzymes:  Recent Labs Lab 02/13/17 1357 02/14/17 0023 02/14/17 0803 02/14/17 1300  TROPONINI 0.03* 0.05* 0.04* 0.04*   BNP (last 3 results) No results for input(s): PROBNP in the last 8760 hours. HbA1C: No results for input(s): HGBA1C in the last 72 hours. CBG: No results for input(s): GLUCAP in the last 168 hours. Lipid Profile: No results for input(s): CHOL, HDL, LDLCALC, TRIG, CHOLHDL, LDLDIRECT in the last 72 hours. Thyroid Function Tests: No results for input(s): TSH, T4TOTAL, FREET4, T3FREE, THYROIDAB in the last 72 hours. Anemia Panel: No results for input(s): VITAMINB12, FOLATE, FERRITIN,  TIBC, IRON, RETICCTPCT in the last 72 hours.    Radiology Studies: I have reviewed all of the imaging during this hospital visit personally     Scheduled Meds: . amiodarone  200 mg Oral Daily  . carvedilol  3.125 mg Oral BID WC  . feeding supplement  1 Container Oral BID BM  . feeding supplement (NEPRO CARB STEADY)  237 mL Oral Q24H  . folic acid  1 mg Oral Daily  . nystatin   Topical BID  . oxyCODONE-acetaminophen  1 tablet Oral Once  . polyethylene glycol  17 g Oral BID  . pravastatin  40 mg Oral QPM  . sodium chloride flush  3 mL Intravenous Q12H  . warfarin  1.5 mg Oral Q M,W,F-1800  . warfarin  3 mg Oral Q T,Th,S,Su-1800  . Warfarin - Pharmacist Dosing Inpatient   Does not apply q1800   Continuous Infusions: . sodium chloride       LOS: 3 days        Ryshawn Sanzone Gerome Apley, MD [facility] Pager [phone]

## 2017-02-16 NOTE — Progress Notes (Signed)
This RN called to room with pt complaining of 8/10 chest pain. Pt reported pain radiating to jaw. Pt appears diaphoretic. Pt's daughter at bedside. 1 nitro given. Pt reported pain decreased to 3/10. EKG obtained. Vitals obtained. BP 114/59, HR 74 NSR, 99% O2, and RR 18. MD notified. Received to give 1 time order of percocet. Will continue to monitor pt.   Grant Fontana BSN, RN

## 2017-02-16 NOTE — Progress Notes (Signed)
[facility] Progress Note   Dialysis Orders: [facility]  4h 180F   400/600 4K/2.25 Ca    48.5kg  R IJ TDC Heparin 1400 Venofer 100mg  IV q HD (until 10/18) Mircera 100 mcg IV q 2 weeks (last dosed 9/22)   Assessment/Plan: 1. ARDS/Pulmonary edema/CHF exacerbation          -Removed total of 7.1L since admission with HD. Repeat CXR today. On 4L [place].             -CXR 9/29: Interval worsening of airspace opacities, concerning for pulmonary edema. Infection cannot be excluded.    -Plans for PT/OT 2. Hyperkalemia - improved. K 3.9 pre HD today.  Follow trend.              -Was on 4K bath as outpatient, w/K 6.3, needs adjustment for d/c. 3. ESRD -TTS - New start, 1 week of outpatient HD at [facility].             -HD this am, serial HD over last 3 days, unable to reach UF goal due to hypotension, resolved with rinse back, net UF 1.1L, post weight 46.2kg.  Continue HD on regular schedule MWF, while admitted, orders written.  4. HTN/volume               -BP and volume improved. Under EDW pre/post HD. Will need new EDW at d/c. 5. Anemia -              -Hgb decreased but within goal.  Hgb 11.9. Follow.  - Last mircera on 9/22.  If still here Sat or continues to trend down will need ESA dose.  6. Secondary hyperparathyroidism -              -Ca in goal. Hyperphosphatemia noted on today's labs, P 6.7, has been going up and down since admission, Follow trends. No VRDA/binders yet. 7.  Nutrition - Alb 3.5             -Nepro, Renavite and Renal diet 8. A. Fib             -on coumadin, pharmacy following. INR 2.48 9 CM s/p PPM EF 35-40% 10. Fungal rash under left breast             -improving w/nystatin powder  Jen Mow, PA-C [place] 02/16/2017,11:28 AM  LOS: 3 days   Pt seen, examined, agree w assess/plan as above with additions as indicated. CXR shows resolution of pulm edema today.  OK for dc frmo renal standpoint.  Post HD wt today in chart is wrong, will  repeat this before setting new dry wt.  Kelly Splinter MD [facility] pager [phone]    cell [phone] 02/16/2017, 4:42 PM       Subjective:   Patient is resting in bed, with daughter (caregiver) at bedside, post HD. Admits to weakness, and feeling "wiped out" after treatment and from not sleeping well due to "beeping" and not eating because she does not like the food. Denies SOB, CP, n/v/d, edema, headache and dizziness.   Objective Vitals:   02/16/17 0915 02/16/17 0930 02/16/17 0957 02/16/17 1045  BP: 133/76 (!) 111/48 (!) 152/76   Pulse: 82 84 81 83  Resp: 19 (!) 25 (!) 24   Temp:   (!) 97.4 F (36.3 C)   TempSrc:   Oral   SpO2: 100% 100% 100%   Weight:   46.2 kg (101 lb 13.6 oz)  Height:       Physical Exam General:NAD, frail elderly WF Heart:RRR Lungs:CTA b/l Abdomen:soft, NT, +BS Extremities:no edema, +vericose veins Dialysis Access: [facility]   Filed Weights   02/15/17 1200 02/16/17 0656 02/16/17 0957  Weight: 45.2 kg (99 lb 10.4 oz) 44.2 kg (97 lb 7.1 oz) 46.2 kg (101 lb 13.6 oz)    Intake/Output Summary (Last 24 hours) at 02/16/17 1128 Last data filed at 02/16/17 0957  Gross per 24 hour  Intake            15.05 ml  Output             3600 ml  Net         -3584.95 ml    Additional Objective Labs: Basic Metabolic Panel:  Recent Labs Lab 02/13/17 1838  02/14/17 0023 02/15/17 0211 02/16/17 0232  NA 133*  < > 135 133* 135  K 6.8*  < > 4.2 5.0 3.9  CL 98*  < > 95* 94* 95*  CO2 26  --  29 30 27   GLUCOSE 109*  < > 129* 94 85  BUN 40*  < > 7 26* 20  CREATININE 2.95*  < > 1.45*  1.44* 2.87* 2.80*  CALCIUM 8.7*  --  9.5 9.2 9.4  PHOS 6.7*  --  2.9  --  6.7*  < > = values in this interval not displayed. Liver Function Tests:  Recent Labs Lab 02/13/17 1357 02/13/17 1838 02/14/17 0023  AST 34  --   --   ALT 23  --   --   ALKPHOS 89  --   --   BILITOT 0.6  --   --   PROT 7.3  --   --   ALBUMIN 3.6 3.0* 3.5   CBC:  Recent Labs Lab  02/13/17 1357 02/13/17 1944 02/14/17 0023  WBC 10.9*  --  9.1  NEUTROABS 8.6*  --   --   HGB 13.2 15.0 11.9*  HCT 42.6 44.0 39.2  MCV 97.7  --  97.3  PLT 259  --  200   Cardiac Enzymes:  Recent Labs Lab 02/13/17 1357 02/14/17 0023 02/14/17 0803 02/14/17 1300  TROPONINI 0.03* 0.05* 0.04* 0.04*    Lab Results  Component Value Date   INR 2.48 02/16/2017   INR 2.41 02/15/2017   INR 2.15 02/14/2017    Medications: . sodium chloride     . amiodarone  200 mg Oral Daily  . carvedilol  3.125 mg Oral BID WC  . feeding supplement  1 Container Oral BID BM  . feeding supplement (NEPRO CARB STEADY)  237 mL Oral Q24H  . folic acid  1 mg Oral Daily  . nystatin   Topical BID  . polyethylene glycol  17 g Oral BID  . pravastatin  40 mg Oral QPM  . sodium chloride flush  3 mL Intravenous Q12H  . warfarin  1.5 mg Oral Q M,W,F-1800  . warfarin  3 mg Oral Q T,Th,S,Su-1800  . Warfarin - Pharmacist Dosing Inpatient   Does not apply [phone]

## 2017-02-16 NOTE — Progress Notes (Signed)
This RN went to round on pt and found pt on floor. Pt stated that she was trying to get her pink gown (home gown) off the floor. Pt helped back to bed. Pt has laceration on her upper lip with minimal bleeding. Ice applied. Vitals obtained. MD notified. Daughter, [name], notified. Pt denies pain at this time. Post-fall assessment completed. No further orders. Pt in bed with bed alarm on and call light within reach. Will continue to monitor pt.  [facility]

## 2017-02-17 LAB — BASIC METABOLIC PANEL
ANION GAP: 21 — AB (ref 5–15)
ANION GAP: 15 (ref 5–15)
BUN: 19 mg/dL (ref 6–20)
BUN: 22 mg/dL — ABNORMAL HIGH (ref 6–20)
CALCIUM: 9.3 mg/dL (ref 8.9–10.3)
CO2: 17 mmol/L — ABNORMAL LOW (ref 22–32)
CO2: 25 mmol/L (ref 22–32)
Calcium: 9.4 mg/dL (ref 8.9–10.3)
Chloride: 95 mmol/L — ABNORMAL LOW (ref 101–111)
Chloride: 94 mmol/L — ABNORMAL LOW (ref 101–111)
Creatinine, Ser: 2.83 mg/dL — ABNORMAL HIGH (ref 0.44–1.00)
Creatinine, Ser: 3.06 mg/dL — ABNORMAL HIGH (ref 0.44–1.00)
GFR, EST AFRICAN AMERICAN: 17 mL/min — AB (ref >60)
GFR, EST AFRICAN AMERICAN: 15 mL/min — AB (ref >60)
GFR, EST NON AFRICAN AMERICAN: 15 mL/min — AB (ref >60)
GFR, EST NON AFRICAN AMERICAN: 13 mL/min — AB (ref >60)
GLUCOSE: 91 mg/dL (ref 65–99)
GLUCOSE: 102 mg/dL — AB (ref 65–99)
POTASSIUM: 5.7 mmol/L — AB (ref 3.5–5.1)
POTASSIUM: 4.3 mmol/L (ref 3.5–5.1)
SODIUM: 134 mmol/L — AB (ref 135–145)
Sodium: 133 mmol/L — ABNORMAL LOW (ref 135–145)

## 2017-02-17 LAB — PTH, INTACT AND CALCIUM
Calcium, Total (PTH): 9.5 mg/dL (ref 8.7–10.3)
PTH: 131 pg/mL — ABNORMAL HIGH (ref 15–65)

## 2017-02-17 LAB — PROTIME-INR
INR: 2.36
PROTHROMBIN TIME: 25.6 s — AB (ref 11.4–15.2)

## 2017-02-17 MED ORDER — POLYETHYLENE GLYCOL 3350 17 G PO PACK: 17.0000 g | PACK | Freq: Every day | ORAL | 0 refills | Status: DC | PRN

## 2017-02-17 NOTE — NC FL2 (Signed)
[place] LEVEL OF CARE SCREENING TOOL     IDENTIFICATION  Patient Name: Valerie Terrell Birthdate: 1933/05/16 Sex: female Admission Date (Current Location): 02/13/2017  [facility] and [place] Number:  Herbalist and Address:  [facility], [place] [address], [place], [place] [address]      Provider Number: [id number]  Attending Physician Name and Address:  Domenic Polite, MD  Relative Name and Phone Number:  Sherilee Smotherman, [phone]    Current Level of Care: Hospital Recommended Level of Care: [place] Prior Approval Number:    Date Approved/Denied:   PASRR Number: [id number]  Discharge Plan: SNF    Current Diagnoses: Patient Active Problem List   Diagnosis Date Noted  . ESRD (end stage renal disease) ([place]) 02/13/2017  . Acute respiratory failure ([place]) 02/13/2017  . Hyperkalemia 02/13/2017  . End stage renal failure on dialysis ([place]) 02/11/2017  . Chronic respiratory failure with hypoxia ([place]) 02/11/2017  . Debilitated patient 02/07/2017  . Poor tolerance for ambulation 02/07/2017  . Fracture of one rib, right side, initial encounter for closed fracture 01/11/2017  . Acute on chronic systolic congestive heart failure ([place])   . Closed fracture of one rib of right side   . Chronic combined systolic and diastolic heart failure ([place]) 01/05/2017  . Palpitations 12/15/2016  . Hypokalemia 12/14/2016  . Leukocytosis 12/14/2016  . Atrial fibrillation ([place]) [I48.91] 12/11/2016  . Long term (current) use of anticoagulants [Z79.01] 12/11/2016  . Tachycardia-bradycardia syndrome ([place]) - s/p PPM 12/07/2016  . Non-ischemic cardiomyopathy ([place]) 12/07/2016  . Acute combined systolic and diastolic heart failure ([place])  12/07/2016  . Cardiac pacemaker   . Acute on chronic respiratory failure with hypoxia ([place])   . Acute pulmonary edema with congestive heart failure ([place]) 11/30/2016  . Elevated troponin level 11/30/2016   . Anemia 11/30/2016  . Chronic midline thoracic back pain 09/15/2016  . Acute CVA (cerebrovascular accident) ([place]) 09/15/2016  . Hyperlipidemia   . Nausea & vomiting 08/05/2016  . Dehydration 08/05/2016  . Acute kidney injury superimposed on CKD ([place]) 08/05/2016  . Osteoporosis 07/28/2016  . Closed compression fracture of thoracic vertebra ([place]) 07/23/2016  . Thoracic compression fracture ([place]) 07/22/2016  . Acute renal failure superimposed on chronic kidney disease ([place]) 07/11/2016  . Flank pain 07/11/2016  . Hypoalbuminemia 07/11/2016  . Renal artery stenosis ([place]) 06/24/2016  . Nondisplaced fracture of fifth metatarsal bone, left foot, initial encounter for closed fracture 06/10/2016  . Rib contusion, left, initial encounter 06/10/2016  . Single kidney 03/13/2016  . Chest pain   . Dyslipidemia   . PAF (paroxysmal atrial fibrillation) ([place]) 01/28/2016  . Essential hypertension 01/28/2016  . History of cardioembolic stroke 94/85/4627  . 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 Personal history of subarachnoid hemorrhage 01/28/2016  . CKD (chronic kidney disease), stage IV ([place]) 01/28/2016    Orientation RESPIRATION BLADDER Height & Weight     Self, Time, Situation, Place  O2 (2L) Continent Weight: 98 lb 12.3 oz (44.8 kg) Height:  4\' 11"  (149.9 cm)  BEHAVIORAL SYMPTOMS/MOOD NEUROLOGICAL BOWEL NUTRITION STATUS      Continent Diet (renal: with fluid restriction: 1258ml)  AMBULATORY STATUS COMMUNICATION OF NEEDS Skin   Limited Assist Verbally Bruising                       Personal Care Assistance Level of Assistance  Bathing, Feeding, Dressing Bathing Assistance: Limited assistance Feeding assistance: Independent  Dressing Assistance: Limited assistance     Functional Limitations Info  Sight, Hearing, Speech Sight Info: Adequate Hearing Info: Adequate Speech Info: Adequate    SPECIAL CARE FACTORS FREQUENCY  PT (By licensed PT), OT (By licensed  OT)     PT Frequency: 5x wk OT Frequency: 5x wk            Contractures Contractures Info: Not present    Additional Factors Info  Code Status, Allergies Code Status Info: Full Code Allergies Info: CLONIDINE DERIVATIVES, CODEINE, NICKEL, SULFA ANTIBIOTICS           Current Medications (02/17/2017):  This is the current hospital active medication list Current Facility-Administered Medications  Medication Dose Route Frequency Provider Last Rate Last Dose  . 0.9 %  sodium chloride infusion  250 mL Intravenous PRN Kathie Dike, MD      . acetaminophen (TYLENOL) tablet 650 mg  650 mg Oral Q6H PRN Kathie Dike, MD   650 mg at 02/15/17 1201   Or  . acetaminophen (TYLENOL) suppository 650 mg  650 mg Rectal Q6H PRN Kathie Dike, MD      . amiodarone (PACERONE) tablet 200 mg  200 mg Oral Daily Kathie Dike, MD   200 mg at 02/17/17 1028  . carvedilol (COREG) tablet 3.125 mg  3.125 mg Oral BID WC Arrien, Jimmy Picket, MD   3.125 mg at 02/17/17 1030  . feeding supplement (BOOST / RESOURCE BREEZE) liquid 1 Container  1 Container Oral BID BM Arrien, Jimmy Picket, MD   1 Container at 02/16/17 1400  . feeding supplement (NEPRO CARB STEADY) liquid 237 mL  237 mL Oral Q24H Arrien, Jimmy Picket, MD      . folic acid (FOLVITE) tablet 1 mg  1 mg Oral Daily Kathie Dike, MD   1 mg at 02/17/17 1028  . nystatin (MYCOSTATIN/NYSTOP) topical powder   Topical BID Penninger, Ria Comment, PA      . ondansetron (ZOFRAN) tablet 4 mg  4 mg Oral Q6H PRN Kathie Dike, MD       Or  . ondansetron (ZOFRAN) injection 4 mg  4 mg Intravenous Q6H PRN Kathie Dike, MD      . oxyCODONE-acetaminophen (PERCOCET/ROXICET) 5-325 MG per tablet 1-2 tablet  1-2 tablet Oral Q6H PRN Kathie Dike, MD   2 tablet at 02/16/17 2153  . polyethylene glycol (MIRALAX / GLYCOLAX) packet 17 g  17 g Oral BID Tawni Millers, MD   17 g at 02/17/17 1633  . pravastatin (PRAVACHOL) tablet 40 mg  40 mg Oral QPM  Kathie Dike, MD   40 mg at 02/16/17 1651  . sodium chloride flush (NS) 0.9 % injection 3 mL  3 mL Intravenous Q12H Kathie Dike, MD   3 mL at 02/17/17 1034  . sodium chloride flush (NS) 0.9 % injection 3 mL  3 mL Intravenous PRN Kathie Dike, MD      . warfarin (COUMADIN) tablet 1.5 mg  1.5 mg Oral Q M,W,F-1800 Erenest Blank, RPH   1.5 mg at 02/15/17 1800  . warfarin (COUMADIN) tablet 3 mg  3 mg Oral Q T,Th,S,Su-1800 Erenest Blank, RPH   3 mg at 02/16/17 1651  . Warfarin - Pharmacist Dosing Inpatient   Does not apply q1800 Erenest Blank [facility]         Discharge Medications: Please see discharge summary for a list of discharge medications.  Relevant Imaging Results:  Relevant Lab Results:   Additional Information SS# [id number]  Wende Neighbors,  LCSW

## 2017-02-17 NOTE — Progress Notes (Signed)
[facility] Brief Add-on to prior note  RN paged this afternoon with patient complaining of abdominal enlargement, wants HD today. Last dialyzed yesterday. BP/HR controlled, O2 sats 100 on 4L/min Valerie Terrell (baseline O2 requirement). No edema on exam. Chest CTAB. Abdomen slightly distended, but soft. Mild generalized tenderness without guarding or rebound tenderness. We discussed her CXR 10/2 showing improved/resolved pulmonary edema. K/CO2/BUN ok today.  In speaking with her and her daughter Valerie Terrell), it seems that Valerie Terrell is worried about being discharge today. No acute indication for dialysis at this moment. Called her hospitalist (Dr. Broadus John) with the above. Likely plan to keep overnight and dialyze tomorrow morning (per usual schedule) prior to discharge.  Loren Racer, PA-C  [employer] Pager [phone]

## 2017-02-17 NOTE — Evaluation (Addendum)
Physical Therapy Evaluation Patient Details Name: Sheba Whaling MRN: [id number] DOB: [birth year] Today's Date: 02/17/2017   History of Present Illness  Pt is an 83 y/o female admitted secondary to increased SOB when receiving dialysis. Chest X-ray revealed bilateral opacities concerning for pulmonary edema. Follow up chest x-ray showed improvement in opacities. Pt also with fall last night, secondary to reports of slipping on her gown. PMH includes HTN, a fib, non ischemic cardiomyopathy, CVA, anxiety, depression, s/p kyphoplasty, CHF, ESRD on dialysis TTS, and s/p pacemaker insertion. Pt on 4L of oxygen at home.  Clinical Impression  Pt admitted secondary to problem above with deficits below. PTA, pt was using RW for short household distances. Required use of WC when she went to her doctors appointments. Required assist with ADLs as well from daughter. Upon eval, pt very limited by fatigue. Only able to tolerate short ambulation distance and required seated rest during the middle of gait training. VSS throughout. Pt's daughter reports she has ordered [facility] for home, however, was wondering if it could be delivered to hospital. Notified case management and case manager to address. Educated about use of WC for mobility when daughter not available to assist. Pt reporting she would like to go home at d/c and will need [facility] services below to increase independence and safety with functional mobility. Will continue to follow acutely and progress mobility according to pt tolerance.   Addendum: 18:15: Spoke with daughter following session and pt thinking about pursuing SNF placement. Feel this is appropriate given current functional limitations to increase independence and safety with functional mobility. Updated recommendations accordingly to SNF. If pt decides to go home, will need HHPT and HHOT.     Follow Up Recommendations SNF;Supervision/Assistance - 24 hour     Equipment Recommendations  Wheelchair  (measurements PT);Wheelchair cushion (measurements PT) (seeing if it can be delivered to hospital)    Recommendations for Other Services OT consult     Precautions / Restrictions Precautions Precautions: Fall Precaution Comments: Pt with fall last night. Per RN, pt reports she reached down to pick up her house coat and slipped on her gown.  Restrictions Weight Bearing Restrictions: No      Mobility  Bed Mobility Overal bed mobility: Needs Assistance Bed Mobility: Supine to Sit     Supine to sit: Modified independent (Device/Increase time)     General bed mobility comments: Increased time with HOB elevated to perform bed mobility.   Transfers Overall transfer level: Needs assistance Equipment used: Rolling walker (2 wheeled) Transfers: Sit to/from Stand Sit to Stand: Min assist         General transfer comment: Min A to power up to standing. Verbal cues for safe hand placement. Pt with posterior lean upon standing and required verbal and manual cues to correct.   Ambulation/Gait Ambulation/Gait assistance: Min guard;Min assist Ambulation Distance (Feet): 10 Feet Assistive device: Rolling walker (2 wheeled) Gait Pattern/deviations: Step-through pattern;Narrow base of support;Trunk flexed;Decreased stride length Gait velocity: decreased Gait velocity interpretation: Below normal speed for age/gender General Gait Details: Slow, unsteady gait with use of RW. Pt with increased posterior lean as pt becoming more fatigued. Min A for steadying. Pt requiring seated rest in the middle of gait training secondary to increased fatigue. VSS.  Stairs            Wheelchair Mobility    Modified Rankin (Stroke Patients Only)       Balance Overall balance assessment: Needs assistance Sitting-balance support: No upper extremity supported;Feet unsupported Sitting  balance-Leahy Scale: Fair   Postural control: Posterior lean Standing balance support: Bilateral upper extremity  supported;During functional activity Standing balance-Leahy Scale: Poor Standing balance comment: Reliant on UE support and external assist for balance.                              Pertinent Vitals/Pain Pain Assessment: No/denies pain    Home Living Family/patient expects to be discharged to:: Private residence Living Arrangements: Children Available Help at Discharge: Family (most of the time ) Type of Home: House Home Access: Ramped entrance     Home Layout: One level Home Equipment: Bedside commode;Wheelchair - [employer] - 2 wheels;Cane - single point      Prior Function Level of Independence: Needs assistance   Gait / Transfers Assistance Needed: RW at home; pt's daughter reports she has ordered a WC for patient.  ADL's / Homemaking Assistance Needed: Assist with bathing and dressing         Hand Dominance   Dominant Hand: Right    Extremity/Trunk Assessment   Upper Extremity Assessment Upper Extremity Assessment: Defer to OT evaluation    Lower Extremity Assessment Lower Extremity Assessment: Generalized weakness (Functional weakness and muscle fatigue during gait. )    Cervical / Trunk Assessment Cervical / Trunk Assessment: Kyphotic  Communication   Communication: HOH  Cognition Arousal/Alertness: Awake/alert Behavior During Therapy: WFL for tasks assessed/performed Overall Cognitive Status: Within Functional Limits for tasks assessed                                        General Comments General comments (skin integrity, edema, etc.): VSS throughout. Pt's daughter asking if WC could be delivered to hospital. Notified case manager, and case manager to address. Educated about using WC in home when daughter not available to assist with mobility, to increase safety at home. Pt reporting she would stay in the bed, however, educated about importance of OOB mobility. Educated about SNF, however, pt wanting to go home and family  agreeable to pt going home. Educated about need for HHPT/OT upon d/c and pt and family agreeable.     Exercises     Assessment/Plan    PT Assessment Patient needs continued PT services  PT Problem List Decreased strength;Decreased activity tolerance;Decreased balance;Decreased mobility;Decreased knowledge of use of DME;Decreased knowledge of precautions       PT Treatment Interventions DME instruction;Gait training;Stair training;Functional mobility training;Therapeutic activities;Therapeutic exercise;Balance training;Patient/family education    PT Goals (Current goals can be found in the Care Plan section)  Acute Rehab PT Goals Patient Stated Goal: to go home PT Goal Formulation: With patient Time For Goal Achievement: 03/03/17 Potential to Achieve Goals: Fair    Frequency Min 3X/week   Barriers to discharge        Co-evaluation               AM-PAC PT "6 Clicks" Daily Activity  Outcome Measure Difficulty turning over in bed (including adjusting bedclothes, sheets and blankets)?: None Difficulty moving from lying on back to sitting on the side of the bed? : None Difficulty sitting down on and standing up from a chair with arms (e.g., wheelchair, bedside commode, etc,.)?: Unable Help needed moving to and from a bed to chair (including a wheelchair)?: A Little Help needed walking in hospital room?: A Little Help needed climbing 3-5 steps  with a railing? : A Lot 6 Click Score: 17    End of Session Equipment Utilized During Treatment: Gait belt;Oxygen (4L) Activity Tolerance: Patient limited by fatigue Patient left: in chair;with call bell/phone within reach;with family/visitor present Nurse Communication: Mobility status PT Visit Diagnosis: Unsteadiness on feet (R26.81);History of falling (Z91.81);Muscle weakness (generalized) (M62.81)    Time: 1478-2956 PT Time Calculation (min) (ACUTE ONLY): 27 min   Charges:   PT Evaluation $PT Eval Moderate Complexity: 1  Mod PT Treatments $Gait Training: 8-22 mins   PT G Codes:        Leighton Ruff, PT, DPT  Acute Rehabilitation Services  Pager: [phone]   Rudean Hitt 02/17/2017, 1:12 PM

## 2017-02-17 NOTE — Care Management Important Message (Signed)
Important Message  Patient Details  Name: Valerie Terrell MRN: [id number] Date of Birth: Dec 11, 1932   Medicare Important Message Given:  Yes    Lennyn Bellanca [place] 02/17/2017, 9:15 AM

## 2017-02-17 NOTE — Care Management Note (Addendum)
Case Management Note Marvetta Gibbons RN, BSN Unit 4E-Case Manager [phone]  Patient Details  Name: Valerie Terrell MRN: [id number] Date of Birth: 21-Jan-1933  Subjective/Objective:   Pt admitted with vol. Overload- Acute resp. Failure with hypoxia.  Hx ESRD-                Action/Plan: PTA pt lived at home with family- was active with [facility] for RN/PT/OT/SW- will need resumption orders for discharge- also per PT family working on getting w/c with [facility]- will check to see if w/c could be delivered to room prior to discharge if order already processed by [facility]- CM will follow   Expected Discharge Date:                  Expected Discharge Plan:  [place]  In-House Referral:     Discharge planning Services  CM Consult  Post Acute Care Choice:  Durable Medical Equipment, Home Health, Resumption of Svcs/PTA Provider Choice offered to:  Patient, Adult Children  DME Arranged:  Programmer, multimedia DME Agency:  [facility]:  RN, OT, PT, Social Work [employer] Agency:  [facility]  Status of Service:  In process, will continue to follow  If discussed at Long Length of Stay Meetings, dates discussed:    Discharge Disposition: Home/home health   Additional Comments:  02/17/17- 1500- Marvetta Gibbons RN, CM- spoke with pt and daughter at bedside regarding resumption of [facility] services- per conversation they feel like pt may need STSNF vs going home- will have CSW do bed search so that pt and daughter can look at options- explained that pt would need insurance approval and pick available bed once bed offers provided- have spoken with [place] regarding SNF bed search - also f/u done on wheelchair that they had ordered from [facility]- per [employer] does have order for w/c- however- w/c has not arrived yet to their store- will f/u in AM to see if it came in their shipment today. - per renal plan to have HD in am prior to discharge.   Dawayne Patricia,  RN 02/17/2017, 11:40 AM

## 2017-02-17 NOTE — Discharge Summary (Signed)
Physician Discharge Summary  Stina Gane HRC:[id number] DOB: [birth year] DOA: 02/13/2017  PCP: Tammi Sou, MD  Admit date: 02/13/2017 Discharge date: 02/17/2017  Time spent: 35 minutes  Recommendations for Outpatient Follow-up:  1. Next HD 10/4 for ongoing fluid removal; titrate down IMDUR as tolerated based on BP 2. Home health PT/OT/RN 3. PCP Dr.McGowen in 1 week, monitor INR at FU   Discharge Diagnoses:    Pulmonary edema   Volume overload   Essential hypertension   Hyperlipidemia   Elevated troponin level   Atrial fibrillation (HCC) [I48.91]   Acute on chronic systolic congestive heart failure (HCC)   ESRD (end stage renal disease) (HCC)   Acute respiratory failure (HCC)   Hyperkalemia   Discharge Condition: stable  Diet recommendation: renal  Filed Weights   02/16/17 0656 02/16/17 0957 02/17/17 0410  Weight: 44.2 kg (97 lb 7.1 oz) 46.2 kg (101 lb 13.6 oz) 44.8 kg (98 lb 12.3 oz)    History of present illness:  83 year old female who presented with shortness of breath. Patient does have significant past medical history of end-stage renal disease on hemodialysis, chronic systolic heart failure with ejection fraction 35-40%, and paroxysmal atrial fibrillation. Patient complained of acute development of significant dyspnea, no other associated symptoms. She recently started on hemodialysis, September 20, presented to dialysis Center with significant chest pain and dyspnea refractory to supplemental oxygen per nasal cannula.  Hospital Course:  1. Acute hypoxic respiratory failure due to pulmonary edema due to volume overload, related to end-stage renal disease. -treated with extra volume removal with HD -lost 8 Kg since admission, still has extra volume but can be removed slowly with outpatient HD at this point per  Renal and felt to be stable for discharge home today, she will resume home health services at discharge and next HD tomorrow 10/4  2. End-stage renal  disease on hemodialysis, complicatedby hyperkalemia - as above, volume status improving  3. Systolic heart failure, acute on chronic, left ventricle ejection fraction 35 to 40%, Continue coreg, volume removal with HD  4. Paroxysmal atrial fibrillation.  -Rate controlled with carvedilol and amiodarone, sp pacemaker. Continue anticoagulation with warfarin, INR at 2.48  5. Hypertension.  -continue Coreg  6. Dyslipidemia. Onpravastatin with good toleration.   7. Severe calorie protein malnutrition. Continue nutrition supplements.    Consultations:  Renal  Discharge Exam: Vitals:   02/17/17 1027 02/17/17 1259  BP: 127/68 123/73  Pulse:  77  Resp:  (!) 22  Temp:    SpO2:  100%    General: AAOx3 Cardiovascular: S1S2/RRR Respiratory: CTAB  Discharge Instructions   Discharge Instructions    Discharge instructions    Complete by:  As directed    Renal Diet   Increase activity slowly    Complete by:  As directed      Current Discharge Medication List    START taking these medications   Details  polyethylene glycol (MIRALAX / GLYCOLAX) packet Take 17 g by mouth daily as needed. Qty: 10 each, Refills: 0      CONTINUE these medications which have NOT CHANGED   Details  amiodarone (PACERONE) 200 MG tablet Take 1 tablet (200 mg total) by mouth daily. Qty: 30 tablet, Refills: 0    carvedilol (COREG) 25 MG tablet Take 1 tablet (25 mg total) by mouth 2 (two) times daily with a meal. Qty: 60 tablet, Refills: 6    folic acid (FOLVITE) 1 MG tablet Take 1 tablet (1 mg total) by mouth daily. Qty:  30 tablet, Refills: 0    isosorbide mononitrate (IMDUR) 60 MG 24 hr tablet Take 1 tablet (60 mg total) by mouth daily. Qty: 30 tablet, Refills: 6    Multiple Vitamin (MULTIVITAMIN) tablet Take 1 tablet by mouth 3 (three) times a week.     ondansetron (ZOFRAN) 4 MG tablet Take 0.5-1 tablets (2-4 mg total) by mouth every 8 (eight) hours as needed for nausea or  vomiting. Qty: 90 tablet, Refills: 3    oxyCODONE-acetaminophen (PERCOCET/ROXICET) 5-325 MG tablet Take 1-2 tablets by mouth every 6 (six) hours as needed for severe pain. Qty: 60 tablet, Refills: 0    pravastatin (PRAVACHOL) 40 MG tablet Take 40 mg by mouth every evening.    Vitamin D, Ergocalciferol, (DRISDOL) 50000 units CAPS capsule 1 cap po q week x 12 weeks Qty: 12 capsule, Refills: 1    warfarin (COUMADIN) 3 MG tablet Take 1/2 to 1 tablet by mouth daily as directed by coumadin clinic Qty: 30 tablet, Refills: 1    acetaminophen (TYLENOL) 500 MG tablet Take 500 mg by mouth every 6 (six) hours as needed for moderate pain.    Calcium Carb-Cholecalciferol (CALCIUM/VITAMIN D PO) Take 1 tablet by mouth 2 (two) times daily.    vitamin C (ASCORBIC ACID) 500 MG tablet Take 500 mg by mouth daily.      STOP taking these medications     amLODipine (NORVASC) 5 MG tablet        Allergies  Allergen Reactions  . Clonidine Derivatives Palpitations and Other (See Comments)    Very sedated  . Codeine Nausea Only  . Nickel Rash  . Sulfa Antibiotics Other (See Comments)    Reaction unknown   Follow-up Information    McGowen, Adrian Blackwater, MD. Schedule an appointment as soon as possible for a visit in 1 week(s).   Specialty:  Family Medicine Contact information: [address] [place] [address] [phone]            The results of significant diagnostics from this hospitalization (including imaging, microbiology, ancillary and laboratory) are listed below for reference.    Significant Diagnostic Studies: Dg Chest 2 View  Result Date: 02/16/2017 CLINICAL DATA:  Edema and shortness of breath, dialysis dependent renal failure. EXAM: CHEST  2 VIEW COMPARISON:  Portable chest x-ray of February 13, 2017 FINDINGS: The lungs are adequately inflated. The interstitial markings are much improved over the previous study. A small left pleural effusion persists. The cardiac  silhouette is enlarged. The pulmonary vascularity is not engorged. The ICD in the dialysis catheter are in stable position. The observed bony thorax exhibits no acute abnormality. IMPRESSION: Cardiomegaly without pulmonary vascular congestion or pulmonary edema. Tiny left pleural effusion. Overall the appearance of the chest has improved since the study of 3 days ago. Electronically Signed   By: David  Martinique M.D.   On: 02/16/2017 12:27   Dg Chest Portable 1 View  Result Date: 02/13/2017 CLINICAL DATA:  83 year old female with a history of shortness of breath EXAM: PORTABLE CHEST 1 VIEW COMPARISON:  01/29/2017 FINDINGS: Cardiomediastinal silhouette is unchanged, with cardiomegaly and partial obscuration of left heart border. Calcifications of the aortic arch. Increasing airspace opacity bilaterally, most pronounced on the right. Interlobular septal thickening and interstitial opacities. Unchanged left chest wall cardiac pacing device with 2 leads in place. Unchanged right IJ approach central hemodialysis catheter, appearing to terminate at the superior right atrium. Prior vertebral augmentation IMPRESSION: Interval worsening of airspace opacities, concerning for pulmonary  edema. Infection cannot be excluded. Cardiomegaly. Unchanged right IJ hemodialysis catheter. Unchanged left sided chest wall cardiac pacing device Electronically Signed   By: Corrie Mckusick D.O.   On: 02/13/2017 14:04   Dg Chest Port 1 View  Result Date: 01/29/2017 CLINICAL DATA:  Status post dialysis catheter placement. EXAM: PORTABLE CHEST 1 VIEW COMPARISON:  Radiograph of January 20, 2017. FINDINGS: Stable cardiomegaly. Atherosclerosis of thoracic aorta is noted. Mild central pulmonary vascular congestion is noted. No pneumothorax is noted. Left-sided pacemaker is unchanged in position. Interval placement of right internal jugular dialysis catheter with distal tip in expected position of right atrium. Mild diffuse interstitial densities  are noted concerning for pulmonary edema. Mild left pleural effusion is noted with associated atelectasis. Bony thorax is unremarkable. IMPRESSION: Aortic atherosclerosis. Stable cardiomegaly with mild central pulmonary vascular congestion and bilateral pulmonary edema. Mild left pleural effusion is noted with associated atelectasis. Interval placement of right internal jugular dialysis catheter with distal tip in expected position of right atrium. Electronically Signed   By: Marijo Conception, M.D.   On: 01/29/2017 11:07   Dg Chest Port 1 View  Result Date: 01/20/2017 CLINICAL DATA:  CHF, history of previous CVA, stage IV chronic renal insufficiency, CHF, nonsmoker. EXAM: PORTABLE CHEST 1 VIEW COMPARISON:  PA and lateral chest x-ray of January 16, 2017 FINDINGS: The lungs are reasonably well inflated. The interstitial markings are increased and the pulmonary vascularity is engorged. The cardiac silhouette remains enlarged. The retrocardiac region is slightly more dense today. There is calcification in the wall of the aortic arch. The ICD is in stable position. The patient has undergone kyphoplasty of approximately T8 in the past. There are old right posterior rib fractures. IMPRESSION: CHF with mild interstitial edema. Left lower lobe atelectasis and probable left pleural effusion. The ICD is in stable position. Thoracic aortic atherosclerosis. Electronically Signed   By: David  Martinique M.D.   On: 01/20/2017 13:30   Dg Fluoro Guide Cv Line-no Report  Result Date: 01/29/2017 Fluoroscopy was utilized by the requesting physician.  No radiographic interpretation.    Microbiology: No results found for this or any previous visit (from the past 240 hour(s)).   Labs: Basic Metabolic Panel:  Recent Labs Lab 02/13/17 1838  02/14/17 0023 02/15/17 0211 02/16/17 0232 02/17/17 0205 02/17/17 0744  NA 133*  < > 135 133* 135 133* 134*  K 6.8*  < > 4.2 5.0 3.9 5.7* 4.3  CL 98*  < > 95* 94* 95* 95* 94*  CO2  26  --  29 30 27  17* 25  GLUCOSE 109*  < > 129* 94 85 91 102*  BUN 40*  < > 7 26* 20 19 22*  CREATININE 2.95*  < > 1.45*  1.44* 2.87* 2.80* 2.83* 3.06*  CALCIUM 8.7*  --  9.5 9.2 9.4 9.4 9.3  PHOS 6.7*  --  2.9  --  6.7*  --   --   < > = values in this interval not displayed. Liver Function Tests:  Recent Labs Lab 02/13/17 1357 02/13/17 1838 02/14/17 0023  AST 34  --   --   ALT 23  --   --   ALKPHOS 89  --   --   BILITOT 0.6  --   --   PROT 7.3  --   --   ALBUMIN 3.6 3.0* 3.5   No results for input(s): LIPASE, AMYLASE in the last 168 hours. No results for input(s): AMMONIA in the last 168 hours.  CBC:  Recent Labs Lab 02/13/17 1357 02/13/17 1944 02/14/17 0023  WBC 10.9*  --  9.1  NEUTROABS 8.6*  --   --   HGB 13.2 15.0 11.9*  HCT 42.6 44.0 39.2  MCV 97.7  --  97.3  PLT 259  --  200   Cardiac Enzymes:  Recent Labs Lab 02/13/17 1357 02/14/17 0023 02/14/17 0803 02/14/17 1300  TROPONINI 0.03* 0.05* 0.04* 0.04*   BNP: BNP (last 3 results)  Recent Labs  12/14/16 2026 01/10/17 2203 02/13/17 1357  BNP 919.4* 1,996.8* 1,035.7*    ProBNP (last 3 results) No results for input(s): PROBNP in the last 8760 hours.  CBG: No results for input(s): GLUCAP in the last 168 hours.     SignedDomenic Polite MD.  [facility] 02/17/2017, 1:14 PM

## 2017-02-17 NOTE — Progress Notes (Signed)
[place] for Warfarin  Indication: atrial fibrillation  Allergies  Allergen Reactions  . Clonidine Derivatives Palpitations and Other (See Comments)    Very sedated  . Codeine Nausea Only  . Nickel Rash  . Sulfa Antibiotics Other (See Comments)    Reaction unknown   Patient Measurements: Height: 4\' 11"  (149.9 cm) Weight: 98 lb 12.3 oz (44.8 kg) IBW/kg (Calculated) : 43.2  Vital Signs: Temp: 97.9 F (36.6 C) (10/03 0346) Temp Source: Oral (10/03 0346)  Labs:  Recent Labs  02/14/17 1300 02/15/17 0211 02/16/17 0232 02/17/17 0205  LABPROT  --  26.1* 26.7* 25.6*  INR  --  2.41 2.48 2.36  CREATININE  --  2.87* 2.80* 2.83*  TROPONINI 0.04*  --   --   --     Estimated Creatinine Clearance: 10.6 mL/min (A) (by C-G formula based on SCr of 2.83 mg/dL (H)).   Medical History: Past Medical History:  Diagnosis Date  . Anemia of chronic disease 2018   + anemia of CRI?  [name] Atrophy of left kidney    with absent blood flow by renal artery dopplers (Dr. Gwenlyn Found)  . 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 Chronic combined systolic and diastolic CHF (congestive heart failure) ([place])    a. 11/2016: echo showing EF of 35-40%, RV strain noted, mild MR and mild TR.   [name] Chronic hypoxemic respiratory failure ([place])   . Debilitated patient    WC dependent as of 2018  . ESRD on hemodialysis ([place]) 01/2017   T/Th/Sat schedule  . History of adenomatous polyp of colon   . History of subarachnoid hemorrhage 10/2014   after syncope and while on xarelto  . Hyperlipidemia   . Hypertension    Difficult to control, in the setting of one functioning kidney: pt was referred to nephrology by Dr. Gwenlyn Found 06/2015.  . Lumbar radiculopathy 2012  . Lumbar spondylosis    MR 07/2016---no sign of spinal nerve compression or cord compression.  Pt set up with outpt ortho while admitted to hosp 07/2016.  . Malnourished ([place])   . Metatarsal fracture 06/10/2016   Nondisplaced, left 5th metatarsal--pt was referred to ortho  . Nonischemic cardiomyopathy ([place])   . Osteopenia 2014   T-score -2.1  . PAF (paroxysmal atrial fibrillation) (HCC)    Eliquis started after BRAO and CVA.  She was changed to warfarin 2018--INR/coumadin mgmt per HD/nephrol.  . Pulmonary edema 11/2016  . Right rib fracture 12/2016   s/p fall  . Stroke [facility])    cardioembolic (had CVA while on no anticoag)--"scattered subacute punctate infarcts: 1 in R parietal lobe and 2 in occipital cortex" on MRI br.  CT angio head/neck: aortic arch athero.   . Thoracic compression fracture ([place]) 07/2016   T7 and T8-- T8 kyphoplasty during hosp admission 07/2016.  Neuro referred pt to pain mgmt for consideration of injection 09/2016.  I referred her to endo 08/2016 for consideration of calcitonin treatment.   Assessment: 83 y/o F on warfarin PTA for hx afib/CVA. INR is therapeutic at 2.36. Hgb 11.9. ERSD on HD.   Warfarin PTA dosing: 1.5 mg on MWF, 3 mg all other days  Goal of Therapy:  INR 2-3 Monitor platelets by anticoagulation protocol: Yes   Plan:  1. Continue warfarin per home regimen  2. Daily PT/INR 3. Monitor for bleeding  [name], PharmD, BCPS 02/17/2017, 8:07 AM

## 2017-02-17 NOTE — Progress Notes (Signed)
[facility] Progress Note   Subjective: Seen in room. Remains on 4L nasal oxygen (says this is her baseline). No chest or abdomimal pain. Per notes, had a small fall last night, no acute pains this morning. CXR 10/2 shows improved pulm edema.  Objective Vitals:   02/16/17 1958 02/16/17 2338 02/17/17 0346 02/17/17 0410  BP: 107/66     Pulse:      Resp:      Temp: (!) 97.5 F (36.4 C) (!) 97.4 F (36.3 C) 97.9 F (36.6 C)   TempSrc: Oral Oral Oral   SpO2:      Weight:    44.8 kg (98 lb 12.3 oz)  Height:       Physical Exam General: Frail appearing female. NAD. On nasal oxygen 4L/min. Heart: RRR; no murmur Lungs: Bibasilar faint rales. Clear in B upper lobes. Extremities: No LE edema Dialysis Access:  TDC in R chest, no erythema/tenderness  Additional Objective Labs: Basic Metabolic Panel:  Recent Labs Lab 02/13/17 1838  02/14/17 0023  02/16/17 0232 02/17/17 0205 02/17/17 0744  NA 133*  < > 135  < > 135 133* 134*  K 6.8*  < > 4.2  < > 3.9 5.7* 4.3  CL 98*  < > 95*  < > 95* 95* 94*  CO2 26  --  29  < > 27 17* 25  GLUCOSE 109*  < > 129*  < > 85 91 102*  BUN 40*  < > 7  < > 20 19 22*  CREATININE 2.95*  < > 1.45*  1.44*  < > 2.80* 2.83* 3.06*  CALCIUM 8.7*  --  9.5  < > 9.4 9.4 9.3  PHOS 6.7*  --  2.9  --  6.7*  --   --   < > = values in this interval not displayed. Liver Function Tests:  Recent Labs Lab 02/13/17 1357 02/13/17 1838 02/14/17 0023  AST 34  --   --   ALT 23  --   --   ALKPHOS 89  --   --   BILITOT 0.6  --   --   PROT 7.3  --   --   ALBUMIN 3.6 3.0* 3.5   CBC:  Recent Labs Lab 02/13/17 1357 02/13/17 1944 02/14/17 0023  WBC 10.9*  --  9.1  NEUTROABS 8.6*  --   --   HGB 13.2 15.0 11.9*  HCT 42.6 44.0 39.2  MCV 97.7  --  97.3  PLT 259  --  200   Cardiac Enzymes:  Recent Labs Lab 02/13/17 1357 02/14/17 0023 02/14/17 0803 02/14/17 1300  TROPONINI 0.03* 0.05* 0.04* 0.04*   Studies/Results: Dg Chest 2  View  Result Date: 02/16/2017 CLINICAL DATA:  Edema and shortness of breath, dialysis dependent renal failure. EXAM: CHEST  2 VIEW COMPARISON:  Portable chest x-ray of February 13, 2017 FINDINGS: The lungs are adequately inflated. The interstitial markings are much improved over the previous study. A small left pleural effusion persists. The cardiac silhouette is enlarged. The pulmonary vascularity is not engorged. The ICD in the dialysis catheter are in stable position. The observed bony thorax exhibits no acute abnormality. IMPRESSION: Cardiomegaly without pulmonary vascular congestion or pulmonary edema. Tiny left pleural effusion. Overall the appearance of the chest has improved since the study of 3 days ago. Electronically Signed   By: David  Martinique M.D.   On: 02/16/2017 12:27   Medications: . sodium chloride     . amiodarone  200  mg Oral Daily  . carvedilol  3.125 mg Oral BID WC  . feeding supplement  1 Container Oral BID BM  . feeding supplement (NEPRO CARB STEADY)  237 mL Oral Q24H  . folic acid  1 mg Oral Daily  . nystatin   Topical BID  . polyethylene glycol  17 g Oral BID  . pravastatin  40 mg Oral QPM  . sodium chloride flush  3 mL Intravenous Q12H  . warfarin  1.5 mg Oral Q M,W,F-1800  . warfarin  3 mg Oral Q T,Th,S,Su-1800  . Warfarin - Pharmacist Dosing Inpatient   Does not apply q1800   Dialysis Orders: [facility]  4h 180F 400/600 4K/2.25 Ca 48.5kg R IJ TDC Heparin 1400 Venofer 100mg  IV q HD (until 10/18) Mircera 100 mcg IV q 2 weeks (last dosed 9/22)   Assessment/Plan: 1. ARDS/Pulmonary edema/CHF exacerbation  - Volume down significantly since admission. Improved CXR 10/2 (edema resolved).              - Plans for PT/OT 2. Hyperkalemia  - Improved overall. Hemolyzed result this AM, improved on repeat. - Was on 4K bath as outpatient, w/ K 6.3, needs adjustment for d/c. 3. ESRD  - TTS schedule, fairly new start, 1 week of outpatient HD at [place] prior  to admit. - s/p serial HD x 3 days, now back on TTS schedule. Next HD 10/4. 4. HTN/volume - BP and volume improved. Still with more to go, but can work on this gradually. 5. Anemia - Hgb decreased to 11.9, remains above goal.              - Last mircera on 9/22.  If still here Sat or continues to trend down will need ESA dose.  6. Secondary hyperparathyroidism - Ca ok, Phos high (variable). Follow trends, not on binder or VDRA yet. 7. Nutrition -Alb remains low at 3.5. Continue Nepro, Renavite and Renal diet 8. A. Fib - On coumadin, pharmacy following. INR 2.48 9. CM s/p PPM EF 35-40% 10. Fungal rash under left breast -improving w/ nystatin powder  [name], PA-C 02/17/2017, 9:18 AM  [facility] Pager: [phone]  Pt seen, examined and agree w A/P as above. Stable for dc from renal standpoint.  Kelly Splinter MD [employer] pager [phone]   02/17/2017, 12:40 PM

## 2017-02-17 NOTE — Progress Notes (Signed)
Pt to discharge tomorrow (10/4) after dialysis, per nephrology and attending MD.    Grant Fontana BSN, RN

## 2017-02-18 LAB — CBC
HEMATOCRIT: 40.6 % (ref 36.0–46.0)
HEMOGLOBIN: 12.6 g/dL (ref 12.0–15.0)
MCH: 29.4 pg (ref 26.0–34.0)
MCHC: 31 g/dL (ref 30.0–36.0)
MCV: 94.9 fL (ref 78.0–100.0)
Platelets: 171 10*3/uL (ref 150–400)
RBC: 4.28 MIL/uL (ref 3.87–5.11)
RDW: 16.5 % — ABNORMAL HIGH (ref 11.5–15.5)
WBC: 9.9 10*3/uL (ref 4.0–10.5)

## 2017-02-18 LAB — RENAL FUNCTION PANEL
ALBUMIN: 3.4 g/dL — AB (ref 3.5–5.0)
ANION GAP: 15 (ref 5–15)
BUN: 38 mg/dL — ABNORMAL HIGH (ref 6–20)
CALCIUM: 8.7 mg/dL — AB (ref 8.9–10.3)
CO2: 22 mmol/L (ref 22–32)
Chloride: 92 mmol/L — ABNORMAL LOW (ref 101–111)
Creatinine, Ser: 4.08 mg/dL — ABNORMAL HIGH (ref 0.44–1.00)
GFR, EST AFRICAN AMERICAN: 11 mL/min — AB (ref >60)
GFR, EST NON AFRICAN AMERICAN: 9 mL/min — AB (ref >60)
Glucose, Bld: 100 mg/dL — ABNORMAL HIGH (ref 65–99)
PHOSPHORUS: 9.2 mg/dL — AB (ref 2.5–4.6)
POTASSIUM: 4.8 mmol/L (ref 3.5–5.1)
SODIUM: 129 mmol/L — AB (ref 135–145)

## 2017-02-18 LAB — PROTIME-INR
INR: 3.34
Prothrombin Time: 33.6 seconds — ABNORMAL HIGH (ref 11.4–15.2)

## 2017-02-18 MED ORDER — WARFARIN SODIUM 3 MG PO TABS: 1.5000 mg | ORAL_TABLET | Freq: Once | ORAL | Status: DC

## 2017-02-18 MED ORDER — SODIUM CHLORIDE 0.9 % IV SOLN: 100.0000 mL | INTRAVENOUS | Status: DC | PRN

## 2017-02-18 MED ORDER — HEPARIN SODIUM (PORCINE) 1000 UNIT/ML DIALYSIS: 20.0000 [IU]/kg | INTRAMUSCULAR | Status: DC | PRN

## 2017-02-18 MED ORDER — HEPARIN SODIUM (PORCINE) 1000 UNIT/ML DIALYSIS: 1000.0000 [IU] | INTRAMUSCULAR | Status: DC | PRN

## 2017-02-18 MED ORDER — LIDOCAINE-PRILOCAINE 2.5-2.5 % EX CREA: 1.0000 "application " | TOPICAL_CREAM | CUTANEOUS | Status: DC | PRN

## 2017-02-18 MED ORDER — LIDOCAINE HCL (PF) 1 % IJ SOLN: 5.0000 mL | INTRAMUSCULAR | Status: DC | PRN

## 2017-02-18 MED ORDER — ALTEPLASE 2 MG IJ SOLR: 2.0000 mg | Freq: Once | INTRAMUSCULAR | Status: DC | PRN

## 2017-02-18 MED ORDER — PENTAFLUOROPROP-TETRAFLUOROETH EX AERO: 1.0000 "application " | INHALATION_SPRAY | CUTANEOUS | Status: DC | PRN

## 2017-02-18 NOTE — Progress Notes (Signed)
[name] to be D/C'd Home per MD order. Discussed with the patient and all questions fully answered.    VVS, Skin clean, dry and intact without evidence of skin break down, no evidence of skin tears noted.  IV catheter discontinued intact. Site without signs and symptoms of complications. Dressing and pressure applied.  An After Visit Summary was printed and given to the patient.  Patient escorted via [place], and D/C home via private auto.  [place]yndra Numbers  02/18/2017 2:24 PM

## 2017-02-18 NOTE — Progress Notes (Signed)
[place] for Warfarin  Indication: atrial fibrillation  Allergies  Allergen Reactions  . Clonidine Derivatives Palpitations and Other (See Comments)    Very sedated  . Codeine Nausea Only  . Nickel Rash  . Sulfa Antibiotics Other (See Comments)    Reaction unknown   Patient Measurements: Height: 4\' 11"  (149.9 cm) Weight: 98 lb 12.3 oz (44.8 kg) IBW/kg (Calculated) : 43.2  Vital Signs: Temp: 98.6 F (37 C) (10/04 0407) Temp Source: Oral (10/04 0407) BP: 120/73 (10/04 0407) Pulse Rate: 71 (10/04 0407)  Labs:  Recent Labs  02/16/17 0232 02/17/17 0205 02/17/17 0744 02/18/17 0247  LABPROT 26.7* 25.6*  --  33.6*  INR 2.48 2.36  --  3.34  CREATININE 2.80* 2.83* 3.06*  --     Estimated Creatinine Clearance: 9.8 mL/min (A) (by C-G formula based on SCr of 3.06 mg/dL (H)).   Medical History: Past Medical History:  Diagnosis Date  . Anemia of chronic disease 2018   + anemia of CRI?  [name] Atrophy of left kidney    with absent blood flow by renal artery dopplers (Dr. Gwenlyn Found)  . 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 Chronic combined systolic and diastolic CHF (congestive heart failure) ([place])    a. 11/2016: echo showing EF of 35-40%, RV strain noted, mild MR and mild TR.   [name] Chronic hypoxemic respiratory failure ([place])   . Debilitated patient    WC dependent as of 2018  . ESRD on hemodialysis ([place]) 01/2017   T/Th/Sat schedule  . History of adenomatous polyp of colon   . History of subarachnoid hemorrhage 10/2014   after syncope and while on xarelto  . Hyperlipidemia   . Hypertension    Difficult to control, in the setting of one functioning kidney: pt was referred to nephrology by Dr. Gwenlyn Found 06/2015.  . Lumbar radiculopathy 2012  . Lumbar spondylosis    MR 07/2016---no sign of spinal nerve compression or cord compression.  Pt set up with outpt ortho while admitted to hosp 07/2016.  . Malnourished ([place])   . Metatarsal  fracture 06/10/2016   Nondisplaced, left 5th metatarsal--pt was referred to ortho  . Nonischemic cardiomyopathy ([place])   . Osteopenia 2014   T-score -2.1  . PAF (paroxysmal atrial fibrillation) (HCC)    Eliquis started after BRAO and CVA.  She was changed to warfarin 2018--INR/coumadin mgmt per HD/nephrol.  . Pulmonary edema 11/2016  . Right rib fracture 12/2016   s/p fall  . Stroke [facility])    cardioembolic (had CVA while on no anticoag)--"scattered subacute punctate infarcts: 1 in R parietal lobe and 2 in occipital cortex" on MRI br.  CT angio head/neck: aortic arch athero.   . Thoracic compression fracture ([place]) 07/2016   T7 and T8-- T8 kyphoplasty during hosp admission 07/2016.  Neuro referred pt to pain mgmt for consideration of injection 09/2016.  I referred her to endo 08/2016 for consideration of calcitonin treatment.   Assessment: 83 y/o F on warfarin PTA for hx afib/CVA. INR is slightly above desired range today at 3.34 from 2.36. CBC stable.   Warfarin PTA dosing: 1.5 mg on MWF, 3 mg all other days  Goal of Therapy:  INR 2-3 Monitor platelets by anticoagulation protocol: Yes   Plan:  1. Warfarin 1.5 mg x 1 this evening (normal home dose would be 3 mg) 2. Daily PT/INR 3. Monitor for bleeding 4. If home today would give 1.5 mg x 1 on 10/4  and then resume normal home dosing regimen with close follow up outpatient   [name], PharmD, BCPS 02/18/2017, 7:29 AM

## 2017-02-18 NOTE — Care Management Note (Signed)
Case Management Note Marvetta Gibbons RN, BSN Unit 4E-Case Manager [phone]  Patient Details  Name: Valerie Terrell MRN: [id number] Date of Birth: [birth year]  Subjective/Objective:   Pt admitted with vol. Overload- Acute resp. Failure with hypoxia.  Hx ESRD-                Action/Plan: PTA pt lived at home with family- was active with [facility] for RN/PT/OT/SW- will need resumption orders for discharge- also per PT family working on getting w/c with [facility]- will check to see if w/c could be delivered to room prior to discharge if order already processed by [facility]- CM will follow   Expected Discharge Date:  02/18/17               Expected Discharge Plan:  [place]  In-House Referral:     Discharge planning Services  CM Consult  Post Acute Care Choice:  Durable Medical Equipment, Home Health, Resumption of Svcs/PTA Provider Choice offered to:  Patient, Adult Children  DME Arranged:  Programmer, multimedia DME Agency:  [place] Arranged:  RN, OT, PT, Social Work [employer] Agency:  [place]  Status of Service:  Completed, signed off  If discussed at [employer], dates discussed:    Discharge Disposition: Home/home health   Additional Comments:  02/18/17- [place], CM- per CSW daughter and pt have decided to return home with [facility]- have notified Jermaine with [facility]- for [facility] services resumption- daughter can f/u with [facility] regarding w/c and pick up at store on [address] when in.   02/17/17- 1500- Cyana Shook RN, CM- spoke with pt and daughter at bedside regarding resumption of [place] services- per conversation they feel like pt may need STSNF vs going home- will have CSW do bed search so that pt and daughter can look at options- explained that pt would need insurance approval and pick available bed once bed offers provided- have spoken with [facility] regarding SNF bed search - also f/u done on wheelchair that they had ordered  from [facility]- per [facility] does have order for w/c- however- w/c has not arrived yet to their store- will f/u in AM to see if it came in their shipment today. - per renal plan to have HD in am prior to discharge.   Dawayne Patricia, RN 02/18/2017, 2:48 PM

## 2017-02-18 NOTE — Progress Notes (Signed)
OT Cancellation Note  Patient Details Name: Valerie Terrell MRN: [id number] DOB: 11-Jun-1932   Cancelled Treatment:    Reason Eval/Treat Not Completed: Patient at procedure or test/ unavailable.  Binnie Kand M.S., OTR/L Pager: [phone]  02/18/2017, 10:53 AM

## 2017-02-18 NOTE — Consult Note (Signed)
   [facility] CM Inpatient Consult   02/18/2017  Valerie Terrell 06/10/1932 [id number]  Active [facility] Care Management patient.  Met with the patient and daughter [name] at the bedside.  Patient states she was tolerating her Hemodialysis pretty well and was not having any trouble getting there.  She states she has decided to return home today with home health care.  Re-iterated that [facility] care management can assist with post hospital follow up needs.  She consents to ongoing follow up but states, softly, "Those automated calls were too much, too many call."  Explained that [facility] management had difficulty maintaining contact.  Daughter states, "If you could have them call my number, please have them call [phone], if they can't reach my mom. I don't feel the extra calls were helpful at all."  Will have the automated called discontinued." [facility] Care Management will follow up.  Natividad Brood, RN BSN [facility] Liaison  [phone] business mobile phone Toll free office [phone]

## 2017-02-18 NOTE — Discharge Instructions (Signed)

## 2017-02-18 NOTE — Progress Notes (Signed)
[facility] Progress Note   Subjective: no c/o's , on HD this am.  Lying flat.  No SOB.    Objective Vitals:   02/18/17 0900 02/18/17 0930 02/18/17 1000 02/18/17 1030  BP: 114/66 110/66 109/64 97/66  Pulse: 77 78 82 76  Resp: 19 20 19 18   Temp:      TempSrc:      SpO2:      Weight:      Height:       Physical Exam General: Frail appearing female. NAD. On nasal oxygen 4L/min. Heart: RRR; no murmur Lungs: clear bilat, diffusely dec'd BS Extremities: No LE edema Dialysis Access:  TDC in R chest, no erythema/tenderness  Additional Objective Labs: Basic Metabolic Panel:  Recent Labs Lab 02/14/17 0023  02/16/17 0232 02/17/17 0205 02/17/17 0744 02/18/17 0800  NA 135  < > 135 133* 134* 129*  K 4.2  < > 3.9 5.7* 4.3 4.8  CL 95*  < > 95* 95* 94* 92*  CO2 29  < > 27 17* 25 22  GLUCOSE 129*  < > 85 91 102* 100*  BUN 7  < > 20 19 22* 38*  CREATININE 1.45*  1.44*  < > 2.80* 2.83* 3.06* 4.08*  CALCIUM 9.5  < > 9.4  9.5 9.4 9.3 8.7*  PHOS 2.9  --  6.7*  --   --  9.2*  < > = values in this interval not displayed. Liver Function Tests:  Recent Labs Lab 02/13/17 1357 02/13/17 1838 02/14/17 0023 02/18/17 0800  AST 34  --   --   --   ALT 23  --   --   --   ALKPHOS 89  --   --   --   BILITOT 0.6  --   --   --   PROT 7.3  --   --   --   ALBUMIN 3.6 3.0* 3.5 3.4*   CBC:  Recent Labs Lab 02/13/17 1357 02/13/17 1944 02/14/17 0023 02/18/17 0800  WBC 10.9*  --  9.1 9.9  NEUTROABS 8.6*  --   --   --   HGB 13.2 15.0 11.9* 12.6  HCT 42.6 44.0 39.2 40.6  MCV 97.7  --  97.3 94.9  PLT 259  --  200 171   Cardiac Enzymes:  Recent Labs Lab 02/13/17 1357 02/14/17 0023 02/14/17 0803 02/14/17 1300  TROPONINI 0.03* 0.05* 0.04* 0.04*   Studies/Results: Dg Chest 2 View  Result Date: 02/16/2017 CLINICAL DATA:  Edema and shortness of breath, dialysis dependent renal failure. EXAM: CHEST  2 VIEW COMPARISON:  Portable chest x-ray of February 13, 2017  FINDINGS: The lungs are adequately inflated. The interstitial markings are much improved over the previous study. A small left pleural effusion persists. The cardiac silhouette is enlarged. The pulmonary vascularity is not engorged. The ICD in the dialysis catheter are in stable position. The observed bony thorax exhibits no acute abnormality. IMPRESSION: Cardiomegaly without pulmonary vascular congestion or pulmonary edema. Tiny left pleural effusion. Overall the appearance of the chest has improved since the study of 3 days ago. Electronically Signed   By: David  Martinique M.D.   On: 02/16/2017 12:27   Medications: . sodium chloride    . sodium chloride    . sodium chloride     . amiodarone  200 mg Oral Daily  . carvedilol  3.125 mg Oral BID WC  . feeding supplement  1 Container Oral BID BM  . feeding supplement (NEPRO  CARB STEADY)  237 mL Oral Q24H  . folic acid  1 mg Oral Daily  . nystatin   Topical BID  . polyethylene glycol  17 g Oral BID  . pravastatin  40 mg Oral QPM  . sodium chloride flush  3 mL Intravenous Q12H  . warfarin  1.5 mg Oral ONCE-1800  . Warfarin - Pharmacist Dosing Inpatient   Does not apply q1800   Dialysis Orders: [facility] TTS  4h 180F 400/600 4K/2.25 Ca 48.5kg R IJ TDC Heparin 1400 Venofer 100mg  IV q HD (until 10/18) Mircera 100 mcg IV q 2 weeks (last dosed 9/22)   Assessment/Plan: 1. ARDS/Pulmonary edema/CHF exacerbation  - Volume down significantly since admission. Improved CXR 10/2 2. Hyperkalemia  - Improved overall. Hemolyzed result this AM, improved on repeat. - Was on 4K bath as outpatient, w/ K 6.3, needs adjustment for d/c. 3. ESRD  - TTS schedule, fairly new start, 1 week of outpatient HD at [place] prior to admit. - s/p serial HD x 3 days. HD today.  OK for dc from our standpoint.   4. HTN/volume - BP and volume improved. Still with more to go, but can work on this gradually. 5. Anemia - Hgb  decreased to 11.9, remains above goal.              - Last mircera on 9/22.  If still here Sat or continues to trend down will need ESA dose.  6. Secondary hyperparathyroidism - Ca ok, Phos high (variable). Follow trends, not on binder or VDRA yet. 7. Nutrition -Alb remains low at 3.5. Continue Nepro, Renavite and Renal diet 8. A. Fib - On coumadin, pharmacy following. INR 2.48 9. CM s/p PPM EF 35-40%     Kelly Splinter MD [employer] pager [phone]   02/18/2017, 11:10 AM

## 2017-02-19 ENCOUNTER — Telehealth: Payer: Self-pay

## 2017-02-19 ENCOUNTER — Other Ambulatory Visit: Payer: Self-pay

## 2017-02-19 DIAGNOSIS — I4891 Unspecified atrial fibrillation: Secondary | ICD-10-CM | POA: Diagnosis not present

## 2017-02-19 DIAGNOSIS — N186 End stage renal disease: Secondary | ICD-10-CM | POA: Diagnosis not present

## 2017-02-19 DIAGNOSIS — I739 Peripheral vascular disease, unspecified: Secondary | ICD-10-CM | POA: Diagnosis not present

## 2017-02-19 DIAGNOSIS — I5043 Acute on chronic combined systolic (congestive) and diastolic (congestive) heart failure: Secondary | ICD-10-CM | POA: Diagnosis not present

## 2017-02-19 DIAGNOSIS — Z950 Presence of cardiac pacemaker: Secondary | ICD-10-CM | POA: Diagnosis not present

## 2017-02-19 DIAGNOSIS — I255 Ischemic cardiomyopathy: Secondary | ICD-10-CM | POA: Diagnosis not present

## 2017-02-19 DIAGNOSIS — N183 Chronic kidney disease, stage 3 (moderate): Secondary | ICD-10-CM | POA: Diagnosis not present

## 2017-02-19 DIAGNOSIS — I130 Hypertensive heart and chronic kidney disease with heart failure and stage 1 through stage 4 chronic kidney disease, or unspecified chronic kidney disease: Secondary | ICD-10-CM | POA: Diagnosis not present

## 2017-02-19 DIAGNOSIS — J9621 Acute and chronic respiratory failure with hypoxia: Secondary | ICD-10-CM | POA: Diagnosis not present

## 2017-02-19 DIAGNOSIS — I480 Paroxysmal atrial fibrillation: Secondary | ICD-10-CM | POA: Diagnosis not present

## 2017-02-19 DIAGNOSIS — E785 Hyperlipidemia, unspecified: Secondary | ICD-10-CM | POA: Diagnosis not present

## 2017-02-19 DIAGNOSIS — Z79891 Long term (current) use of opiate analgesic: Secondary | ICD-10-CM | POA: Diagnosis not present

## 2017-02-19 DIAGNOSIS — D638 Anemia in other chronic diseases classified elsewhere: Secondary | ICD-10-CM | POA: Diagnosis not present

## 2017-02-19 DIAGNOSIS — Z9981 Dependence on supplemental oxygen: Secondary | ICD-10-CM | POA: Diagnosis not present

## 2017-02-19 DIAGNOSIS — Z5181 Encounter for therapeutic drug level monitoring: Secondary | ICD-10-CM | POA: Diagnosis not present

## 2017-02-19 DIAGNOSIS — Z09 Encounter for follow-up examination after completed treatment for conditions other than malignant neoplasm: Secondary | ICD-10-CM

## 2017-02-19 NOTE — Telephone Encounter (Signed)
Spoke with patients daughter, Bryson Ha.   Transition Care Management Follow-up Telephone Call   Date discharged? 02/18/2017   How have you been since you were released from the hospital? "She's okay"   Do you understand why you were in the hospital? yes   Do you understand the discharge instructions? yes   Where were you discharged to? Home, lives with daughter   Items Reviewed:  Medications reviewed: no, not available at time of call  Allergies reviewed: yes  Dietary changes reviewed: yes  Referrals reviewed: yes   Functional Questionnaire:   Activities of Daily Living (ADLs):   She states they are independent in the following: None States they require assistance with the following: Daughter states pt needs assistance with all ADL's.    Any transportation issues/concerns?: no   Any patient concerns? Yes, daughter would like home health aid to assist with ADL's if covered by insurance. Referral placed with [facility].    Confirmed importance and date/time of follow-up visits scheduled yes  Provider Appointment booked with PCP 03/03/17 @ 2:30 pm.   Confirmed with patient if condition begins to worsen call PCP or go to the ER.  Patient was given the office number and encouraged to call back with question or concerns.  : yes

## 2017-02-19 NOTE — Telephone Encounter (Signed)
Noted: nurse contact with pt for [facility] f/u done today, 02/19/17.

## 2017-02-19 NOTE — Patient Outreach (Signed)
[place] [facility]) Care Management  02/19/2017  Valerie Terrell [birth year] [id number]  Subjective: none  Objective: none  Assessment: 83 year old with recent admission 7/16-7/23 with respiratory failure due to pulmonary edema due to volume overload from acute kidney failure; Pacer placed 7/19 due to tachy brady syndrome. Client readmitted 7/31-8/2 with heart failure, atrial fibrillation, hypertension, CKD. Presented to the emergency room on 8/16 post fall, forehead laceration. Admitted 8/26-9/6 for acute on chronic respiratory failure with hypoxia. The latest admission was 9/29-10/3 for pulmonary edema, volume overload.  History of: ESRD (new hemodialysis), HTN, Heart failure, atrial fibrillation, hyperlipidemia, calorie protein malnutrition.  RNCM called for transition of care. No answer. Unable to leave message.   Plan: RNCM will call next business day.  Thea Silversmith, RN, MSN, [place] Coordinator Cell: [phone]

## 2017-02-20 DIAGNOSIS — I4891 Unspecified atrial fibrillation: Secondary | ICD-10-CM | POA: Diagnosis not present

## 2017-02-20 DIAGNOSIS — E876 Hypokalemia: Secondary | ICD-10-CM | POA: Diagnosis not present

## 2017-02-20 DIAGNOSIS — D631 Anemia in chronic kidney disease: Secondary | ICD-10-CM | POA: Diagnosis not present

## 2017-02-20 DIAGNOSIS — Z4901 Encounter for fitting and adjustment of extracorporeal dialysis catheter: Secondary | ICD-10-CM | POA: Diagnosis not present

## 2017-02-20 DIAGNOSIS — D689 Coagulation defect, unspecified: Secondary | ICD-10-CM | POA: Diagnosis not present

## 2017-02-20 DIAGNOSIS — N186 End stage renal disease: Secondary | ICD-10-CM | POA: Diagnosis not present

## 2017-02-22 ENCOUNTER — Other Ambulatory Visit: Payer: Self-pay

## 2017-02-22 DIAGNOSIS — E785 Hyperlipidemia, unspecified: Secondary | ICD-10-CM | POA: Diagnosis not present

## 2017-02-22 DIAGNOSIS — I4891 Unspecified atrial fibrillation: Secondary | ICD-10-CM | POA: Diagnosis not present

## 2017-02-22 DIAGNOSIS — Z950 Presence of cardiac pacemaker: Secondary | ICD-10-CM | POA: Diagnosis not present

## 2017-02-22 DIAGNOSIS — I5043 Acute on chronic combined systolic (congestive) and diastolic (congestive) heart failure: Secondary | ICD-10-CM | POA: Diagnosis not present

## 2017-02-22 DIAGNOSIS — Z5181 Encounter for therapeutic drug level monitoring: Secondary | ICD-10-CM | POA: Diagnosis not present

## 2017-02-22 DIAGNOSIS — D638 Anemia in other chronic diseases classified elsewhere: Secondary | ICD-10-CM | POA: Diagnosis not present

## 2017-02-22 DIAGNOSIS — Z79891 Long term (current) use of opiate analgesic: Secondary | ICD-10-CM | POA: Diagnosis not present

## 2017-02-22 DIAGNOSIS — I255 Ischemic cardiomyopathy: Secondary | ICD-10-CM | POA: Diagnosis not present

## 2017-02-22 DIAGNOSIS — N183 Chronic kidney disease, stage 3 (moderate): Secondary | ICD-10-CM | POA: Diagnosis not present

## 2017-02-22 DIAGNOSIS — I130 Hypertensive heart and chronic kidney disease with heart failure and stage 1 through stage 4 chronic kidney disease, or unspecified chronic kidney disease: Secondary | ICD-10-CM | POA: Diagnosis not present

## 2017-02-22 DIAGNOSIS — Z9981 Dependence on supplemental oxygen: Secondary | ICD-10-CM | POA: Diagnosis not present

## 2017-02-22 DIAGNOSIS — I739 Peripheral vascular disease, unspecified: Secondary | ICD-10-CM | POA: Diagnosis not present

## 2017-02-22 NOTE — Patient Outreach (Signed)
[place] [facility]) Care Management  02/22/2017  Smt. Loder 03-03-1933 [id number]   Medication Adherence call to Mrs. Valerie Terrell the reason for this call is because Mrs. Valerie Terrell she is showing past due under [facility] Ins.on her pravastatin 40 mg spoke to patients daughter she said that Valerie Terrell has been in the hospital and the hospital has been providing her medication that is  why she is showing  behind on her medication, patient still has some medication she does not need any at this time.   [place] Management Direct Dial [phone]  Fax [phone] [email or web address]

## 2017-02-22 NOTE — Patient Outreach (Signed)
[place] [facility]) Care Management  02/22/2017  Valerie Terrell 1932/05/20 [id number]   Subjective: none  Objective: none  Assessment: 83 year old with recent admission 7/16-7/23 with respiratory failure due to pulmonary edema due to volume overload from acute kidney failure; Pacer placed 7/19 due to tachy brady syndrome. Client readmitted 7/31-8/2 with heart failure, atrial fibrillation, hypertension, CKD. Presented to the emergency room on 8/16 post fall, forehead laceration. Admitted 8/26-9/6 for acute on chronic respiratory failure with hypoxia. The latest admission was 9/29-10/3 for pulmonary edema, volume overload.  History of: ESRD (new hemodialysis), HTN, Heart failure, atrial fibrillation, hyperlipidemia, calorie protein malnutrition.  RNCM called for transition of care. No answer. HIPPA compliant message left. RNCM has been having difficulty reaching client. Three outreach attempts and an outreach letter sent prior to most recent hospitalization. This is the second outreach attempt post discharge date of 10/3.  Follow up call within the next 1-2 business days.  Thea Silversmith, RN, MSN, [place] Coordinator Cell: [phone]

## 2017-02-23 ENCOUNTER — Other Ambulatory Visit: Payer: Self-pay

## 2017-02-23 DIAGNOSIS — D631 Anemia in chronic kidney disease: Secondary | ICD-10-CM | POA: Diagnosis not present

## 2017-02-23 DIAGNOSIS — E440 Moderate protein-calorie malnutrition: Secondary | ICD-10-CM | POA: Insufficient documentation

## 2017-02-23 DIAGNOSIS — Z4901 Encounter for fitting and adjustment of extracorporeal dialysis catheter: Secondary | ICD-10-CM | POA: Diagnosis not present

## 2017-02-23 DIAGNOSIS — N186 End stage renal disease: Secondary | ICD-10-CM | POA: Diagnosis not present

## 2017-02-23 DIAGNOSIS — I4891 Unspecified atrial fibrillation: Secondary | ICD-10-CM | POA: Diagnosis not present

## 2017-02-23 DIAGNOSIS — D689 Coagulation defect, unspecified: Secondary | ICD-10-CM | POA: Diagnosis not present

## 2017-02-23 DIAGNOSIS — E876 Hypokalemia: Secondary | ICD-10-CM | POA: Diagnosis not present

## 2017-02-23 NOTE — Patient Outreach (Signed)
[facility]) Care Management  02/23/2017  Valerie Terrell 1932-06-28 [id number]  Subjective: none  Objective: none  Assessment: 83 year old with recent admission 7/16-7/23 with respiratory failure due to pulmonary edema due to volume overload from acute kidney failure; Pacer placed 7/19 due to tachy brady syndrome. Client readmitted 7/31-8/2 with heart failure, atrial fibrillation, hypertension, CKD. Presented to the emergency room on 8/16 post fall, forehead laceration. Admitted 8/26-9/6 for acute on chronic respiratory failure with hypoxia. The latest admission was 9/29-10/3 for pulmonary edema, volume overload.  History of: ESRD (new hemodialysis), HTN, Heart failure, atrial fibrillation, hyperlipidemia, calorie protein malnutrition.  RNCM called for transition of care. No answer. Unable to leave message. This is the third outreach call.   Plan: RNCM will send outreach letter.  Thea Silversmith, RN, MSN, [place] Coordinator Cell: [phone]

## 2017-02-24 DIAGNOSIS — I739 Peripheral vascular disease, unspecified: Secondary | ICD-10-CM | POA: Diagnosis not present

## 2017-02-24 DIAGNOSIS — N183 Chronic kidney disease, stage 3 (moderate): Secondary | ICD-10-CM | POA: Diagnosis not present

## 2017-02-24 DIAGNOSIS — I130 Hypertensive heart and chronic kidney disease with heart failure and stage 1 through stage 4 chronic kidney disease, or unspecified chronic kidney disease: Secondary | ICD-10-CM | POA: Diagnosis not present

## 2017-02-24 DIAGNOSIS — Z9981 Dependence on supplemental oxygen: Secondary | ICD-10-CM | POA: Diagnosis not present

## 2017-02-24 DIAGNOSIS — Z5181 Encounter for therapeutic drug level monitoring: Secondary | ICD-10-CM | POA: Diagnosis not present

## 2017-02-24 DIAGNOSIS — M546 Pain in thoracic spine: Secondary | ICD-10-CM | POA: Diagnosis not present

## 2017-02-24 DIAGNOSIS — I5043 Acute on chronic combined systolic (congestive) and diastolic (congestive) heart failure: Secondary | ICD-10-CM | POA: Diagnosis not present

## 2017-02-24 DIAGNOSIS — E785 Hyperlipidemia, unspecified: Secondary | ICD-10-CM | POA: Diagnosis not present

## 2017-02-24 DIAGNOSIS — I4891 Unspecified atrial fibrillation: Secondary | ICD-10-CM | POA: Diagnosis not present

## 2017-02-24 DIAGNOSIS — I255 Ischemic cardiomyopathy: Secondary | ICD-10-CM | POA: Diagnosis not present

## 2017-02-24 DIAGNOSIS — D638 Anemia in other chronic diseases classified elsewhere: Secondary | ICD-10-CM | POA: Diagnosis not present

## 2017-02-24 DIAGNOSIS — Z79891 Long term (current) use of opiate analgesic: Secondary | ICD-10-CM | POA: Diagnosis not present

## 2017-02-24 DIAGNOSIS — Z950 Presence of cardiac pacemaker: Secondary | ICD-10-CM | POA: Diagnosis not present

## 2017-02-25 DIAGNOSIS — I4891 Unspecified atrial fibrillation: Secondary | ICD-10-CM | POA: Diagnosis not present

## 2017-02-25 DIAGNOSIS — N186 End stage renal disease: Secondary | ICD-10-CM | POA: Diagnosis not present

## 2017-02-25 DIAGNOSIS — E876 Hypokalemia: Secondary | ICD-10-CM | POA: Diagnosis not present

## 2017-02-25 DIAGNOSIS — D689 Coagulation defect, unspecified: Secondary | ICD-10-CM | POA: Diagnosis not present

## 2017-02-25 DIAGNOSIS — Z4901 Encounter for fitting and adjustment of extracorporeal dialysis catheter: Secondary | ICD-10-CM | POA: Diagnosis not present

## 2017-02-25 DIAGNOSIS — D631 Anemia in chronic kidney disease: Secondary | ICD-10-CM | POA: Diagnosis not present

## 2017-02-26 DIAGNOSIS — I130 Hypertensive heart and chronic kidney disease with heart failure and stage 1 through stage 4 chronic kidney disease, or unspecified chronic kidney disease: Secondary | ICD-10-CM | POA: Diagnosis not present

## 2017-02-26 DIAGNOSIS — I480 Paroxysmal atrial fibrillation: Secondary | ICD-10-CM | POA: Diagnosis not present

## 2017-02-26 DIAGNOSIS — Z9981 Dependence on supplemental oxygen: Secondary | ICD-10-CM | POA: Diagnosis not present

## 2017-02-26 DIAGNOSIS — Z5181 Encounter for therapeutic drug level monitoring: Secondary | ICD-10-CM | POA: Diagnosis not present

## 2017-02-26 DIAGNOSIS — J9621 Acute and chronic respiratory failure with hypoxia: Secondary | ICD-10-CM | POA: Diagnosis not present

## 2017-02-26 DIAGNOSIS — D638 Anemia in other chronic diseases classified elsewhere: Secondary | ICD-10-CM | POA: Diagnosis not present

## 2017-02-26 DIAGNOSIS — Z79891 Long term (current) use of opiate analgesic: Secondary | ICD-10-CM | POA: Diagnosis not present

## 2017-02-26 DIAGNOSIS — I5043 Acute on chronic combined systolic (congestive) and diastolic (congestive) heart failure: Secondary | ICD-10-CM | POA: Diagnosis not present

## 2017-02-26 DIAGNOSIS — N186 End stage renal disease: Secondary | ICD-10-CM | POA: Diagnosis not present

## 2017-02-26 DIAGNOSIS — I739 Peripheral vascular disease, unspecified: Secondary | ICD-10-CM | POA: Diagnosis not present

## 2017-02-26 DIAGNOSIS — I255 Ischemic cardiomyopathy: Secondary | ICD-10-CM | POA: Diagnosis not present

## 2017-02-26 DIAGNOSIS — E785 Hyperlipidemia, unspecified: Secondary | ICD-10-CM | POA: Diagnosis not present

## 2017-02-26 DIAGNOSIS — Z950 Presence of cardiac pacemaker: Secondary | ICD-10-CM | POA: Diagnosis not present

## 2017-02-27 DIAGNOSIS — Z4901 Encounter for fitting and adjustment of extracorporeal dialysis catheter: Secondary | ICD-10-CM | POA: Diagnosis not present

## 2017-02-27 DIAGNOSIS — D689 Coagulation defect, unspecified: Secondary | ICD-10-CM | POA: Diagnosis not present

## 2017-02-27 DIAGNOSIS — N186 End stage renal disease: Secondary | ICD-10-CM | POA: Diagnosis not present

## 2017-02-27 DIAGNOSIS — E876 Hypokalemia: Secondary | ICD-10-CM | POA: Diagnosis not present

## 2017-02-27 DIAGNOSIS — I4891 Unspecified atrial fibrillation: Secondary | ICD-10-CM | POA: Diagnosis not present

## 2017-02-27 DIAGNOSIS — D631 Anemia in chronic kidney disease: Secondary | ICD-10-CM | POA: Diagnosis not present

## 2017-02-27 LAB — PROTIME-INR: INR: 5.8 — AB (ref 0.9–1.1)

## 2017-03-01 DIAGNOSIS — I480 Paroxysmal atrial fibrillation: Secondary | ICD-10-CM | POA: Diagnosis not present

## 2017-03-01 DIAGNOSIS — Z5181 Encounter for therapeutic drug level monitoring: Secondary | ICD-10-CM | POA: Diagnosis not present

## 2017-03-01 DIAGNOSIS — Z950 Presence of cardiac pacemaker: Secondary | ICD-10-CM | POA: Diagnosis not present

## 2017-03-01 DIAGNOSIS — I739 Peripheral vascular disease, unspecified: Secondary | ICD-10-CM | POA: Diagnosis not present

## 2017-03-01 DIAGNOSIS — I130 Hypertensive heart and chronic kidney disease with heart failure and stage 1 through stage 4 chronic kidney disease, or unspecified chronic kidney disease: Secondary | ICD-10-CM | POA: Diagnosis not present

## 2017-03-01 DIAGNOSIS — E785 Hyperlipidemia, unspecified: Secondary | ICD-10-CM | POA: Diagnosis not present

## 2017-03-01 DIAGNOSIS — Z9981 Dependence on supplemental oxygen: Secondary | ICD-10-CM | POA: Diagnosis not present

## 2017-03-01 DIAGNOSIS — Z79891 Long term (current) use of opiate analgesic: Secondary | ICD-10-CM | POA: Diagnosis not present

## 2017-03-01 DIAGNOSIS — N186 End stage renal disease: Secondary | ICD-10-CM | POA: Diagnosis not present

## 2017-03-01 DIAGNOSIS — J9621 Acute and chronic respiratory failure with hypoxia: Secondary | ICD-10-CM | POA: Diagnosis not present

## 2017-03-01 DIAGNOSIS — I255 Ischemic cardiomyopathy: Secondary | ICD-10-CM | POA: Diagnosis not present

## 2017-03-01 DIAGNOSIS — I5043 Acute on chronic combined systolic (congestive) and diastolic (congestive) heart failure: Secondary | ICD-10-CM | POA: Diagnosis not present

## 2017-03-01 DIAGNOSIS — D638 Anemia in other chronic diseases classified elsewhere: Secondary | ICD-10-CM | POA: Diagnosis not present

## 2017-03-02 ENCOUNTER — Other Ambulatory Visit: Payer: Self-pay

## 2017-03-02 DIAGNOSIS — D631 Anemia in chronic kidney disease: Secondary | ICD-10-CM | POA: Diagnosis not present

## 2017-03-02 DIAGNOSIS — I4891 Unspecified atrial fibrillation: Secondary | ICD-10-CM | POA: Diagnosis not present

## 2017-03-02 DIAGNOSIS — D689 Coagulation defect, unspecified: Secondary | ICD-10-CM | POA: Diagnosis not present

## 2017-03-02 DIAGNOSIS — Z01810 Encounter for preprocedural cardiovascular examination: Secondary | ICD-10-CM

## 2017-03-02 DIAGNOSIS — N186 End stage renal disease: Secondary | ICD-10-CM

## 2017-03-02 DIAGNOSIS — Z4901 Encounter for fitting and adjustment of extracorporeal dialysis catheter: Secondary | ICD-10-CM | POA: Diagnosis not present

## 2017-03-02 DIAGNOSIS — E876 Hypokalemia: Secondary | ICD-10-CM | POA: Diagnosis not present

## 2017-03-02 NOTE — Patient Outreach (Signed)
[name] [facility]) Care Management  03/02/2017  Valerie Terrell [birth year] [id number]   Assessment: 83 year old with recent admission 7/16-7/23 with respiratory failure due to pulmonary edema due to volume overload from acute kidney failure; Pacer placed 7/19 due to tachy brady syndrome. Client readmitted 7/31-8/2 with heart failure, atrial fibrillation, hypertension, CKD. Presented to the emergency room on 8/16 post fall, forehead laceration. Admitted 8/26-9/6 for acute on chronic respiratory failure with hypoxia. The latest admission was 9/29-10/3 for pulmonary edema, volume overload.  History of: ESRD (new hemodialysis), HTN, Heart failure, atrial fibrillation, hyperlipidemia, calorie protein malnutrition  Care coordination: RNCM has attempted outreach to client numerous times with no response. Outreach letter sent 02/24/17. RNCM called to [facility] to discuss. No answer. HIPPA compliant message left.  Thea Silversmith, RN, MSN, [place] Coordinator Cell: [phone]

## 2017-03-03 ENCOUNTER — Encounter: Payer: Self-pay | Admitting: Family Medicine

## 2017-03-03 ENCOUNTER — Ambulatory Visit (INDEPENDENT_AMBULATORY_CARE_PROVIDER_SITE_OTHER): Payer: Medicare Other | Admitting: Family Medicine

## 2017-03-03 ENCOUNTER — Ambulatory Visit (INDEPENDENT_AMBULATORY_CARE_PROVIDER_SITE_OTHER): Payer: Medicare Other | Admitting: Pharmacist

## 2017-03-03 VITALS — BP 164/77 | HR 75 | Temp 97.6°F | Resp 16 | Ht 59.0 in | Wt 104.5 lb

## 2017-03-03 DIAGNOSIS — Z23 Encounter for immunization: Secondary | ICD-10-CM | POA: Diagnosis not present

## 2017-03-03 DIAGNOSIS — M549 Dorsalgia, unspecified: Secondary | ICD-10-CM | POA: Diagnosis not present

## 2017-03-03 DIAGNOSIS — J9601 Acute respiratory failure with hypoxia: Secondary | ICD-10-CM | POA: Diagnosis not present

## 2017-03-03 DIAGNOSIS — N186 End stage renal disease: Secondary | ICD-10-CM | POA: Diagnosis not present

## 2017-03-03 DIAGNOSIS — E8770 Fluid overload, unspecified: Secondary | ICD-10-CM | POA: Diagnosis not present

## 2017-03-03 DIAGNOSIS — Z7901 Long term (current) use of anticoagulants: Secondary | ICD-10-CM

## 2017-03-03 DIAGNOSIS — I4891 Unspecified atrial fibrillation: Secondary | ICD-10-CM

## 2017-03-03 DIAGNOSIS — I10 Essential (primary) hypertension: Secondary | ICD-10-CM | POA: Diagnosis not present

## 2017-03-03 DIAGNOSIS — Z992 Dependence on renal dialysis: Secondary | ICD-10-CM

## 2017-03-03 DIAGNOSIS — R791 Abnormal coagulation profile: Secondary | ICD-10-CM | POA: Diagnosis not present

## 2017-03-03 DIAGNOSIS — I480 Paroxysmal atrial fibrillation: Secondary | ICD-10-CM

## 2017-03-03 LAB — PROTIME-INR
INR: 3 ratio — ABNORMAL HIGH (ref 0.8–1.0)
Prothrombin Time: 31.6 s — ABNORMAL HIGH (ref 9.6–13.1)

## 2017-03-03 MED ORDER — OXYCODONE-ACETAMINOPHEN 5-325 MG PO TABS: 1.0000 | ORAL_TABLET | Freq: Four times a day (QID) | ORAL | 0 refills | Status: DC | PRN

## 2017-03-03 NOTE — Progress Notes (Signed)
03/03/2017  CC:  Chief Complaint  Patient presents with  . Hospitalization Follow-up    TCM    Patient is a 83 y.o. Caucasian female who presents for  hospital follow up, specifically [facility] face-to-face visit. Dates hospitalized: 9/29-10/3, 2018. Days since d/c from hospital: 14 Patient was discharged from hospital to home. Reason for admission to hospital: SOB; acute resp failure from volume overload/pulm edema in the setting of ESRD on HD. Date of interactive (phone) contact with patient and/or caregiver: 02/19/17.  I have reviewed patient's discharge summary plus pertinent specific notes, labs, and imaging from the hospitalization.   She had large amount of fluid removed via HD in hospital and improved appropriately. Most recent HD was yesterday.  Has had hyperphosphatemia recently, sevelamer rx'd. Also INR supratherapeutic on 10/4, unclear whether coumadin clinic did another after this, but pt was told yesterday when she saw PA at HD that INR was up and she should not take her dose that day.  Pt complains of poor communication between HD clinic and coumadin clinic, leaving the pt in the dark about coumadin dosing, etc.  She asks me to check INR today. She denies melena, hematochezia, epistaxis, gross hematuria, or excessive bruising.  Breathing feels good, not requiring oxygen.  Has increasing mid thoracic back pain and takes 1-2 oxycodone per day, has appt with Dr. Nelva Bush 03/05/17.  BP monitoring at home 885O systolic.  HR high 60s.  Taking coreg 25mg  bid now. O2 sat 93-96% on RA even with walking around.  PT at home helping.  Medication reconciliation was done today and patient is  taking meds as recommended by discharging hospitalist/specialist.    PMH:  Past Medical History:  Diagnosis Date  . Anemia of chronic disease 2018   + anemia of CRI?  [name] Atrophy of left kidney    with absent blood flow by renal artery dopplers (Dr. Gwenlyn Found)  . Branch retinal  artery occlusion of left eye 2017  . Chronic combined systolic and diastolic CHF (congestive heart failure) ([place])    a. 11/2016: echo showing EF of 35-40%, RV strain noted, mild MR and mild TR.   [name] Chronic hypoxemic respiratory failure ([place])   . Debilitated patient    WC dependent as of 2018  . ESRD on hemodialysis ([place]) 01/2017   T/Th/Sat schedule  . History of adenomatous polyp of colon   . History of subarachnoid hemorrhage 10/2014   after syncope and while on xarelto  . Hyperlipidemia   . Hypertension    Difficult to control, in the setting of one functioning kidney: pt was referred to nephrology by Dr. Gwenlyn Found 06/2015.  . Lumbar radiculopathy 2012  . Lumbar spondylosis    MR 07/2016---no sign of spinal nerve compression or cord compression.  Pt set up with outpt ortho while admitted to hosp 07/2016.  . Malnourished ([place])   . Metatarsal fracture 06/10/2016   Nondisplaced, left 5th metatarsal--pt was referred to ortho  . Nonischemic cardiomyopathy ([place])   . Osteopenia 2014   T-score -2.1  . PAF (paroxysmal atrial fibrillation) (HCC)    Eliquis started after BRAO and CVA.  She was changed to warfarin 2018--INR/coumadin mgmt per HD/nephrol.  . Pulmonary edema 11/2016  . Right rib fracture 12/2016   s/p fall  . Stroke [facility])    cardioembolic (had CVA while on no anticoag)--"scattered subacute punctate infarcts: 1 in R parietal lobe and 2 in occipital cortex" on MRI br.  CT angio head/neck: aortic arch athero.   [name]  Thoracic compression fracture ([place]) 07/2016   T7 and T8-- T8 kyphoplasty during hosp admission 07/2016.  Neuro referred pt to pain mgmt for consideration of injection 09/2016.  I referred her to endo 08/2016 for consideration of calcitonin treatment.    PSH:  Past Surgical History:  Procedure Laterality Date  . APPENDECTOMY  child  . CARDIOVASCULAR STRESS TEST  01/31/2016   Stress myoview: NORMAL/Low risk.  EF 56%.  . [place]  . COLONOSCOPY  2015   + hx of  adenomatous polyps.  Need digest health spec in [place] records to see when pt due for next colonoscopy  . INSERTION OF DIALYSIS CATHETER Right 01/29/2017   Procedure: INSERTION OF DIALYSIS CATHETER- RIGHT INTERNAL JUGULAR;  Surgeon: [name], MD;  Location: [facility] OR;  Service: Vascular;  Laterality: Right;  . IR GENERIC HISTORICAL  07/24/2016   IR KYPHO THORACIC WITH BONE BIOPSY 07/24/2016 Luanne Bras, MD [facility]-INTERV RAD  . KYPHOPLASTY  07/27/2016   T8  . PACEMAKER IMPLANT N/A 12/03/2016   Procedure: Pacemaker Implant;  Surgeon: Evans Lance, MD;  Location: [facility];  Service: Cardiovascular;  Laterality: N/A;  . Renal artery dopplers  02/26/2016   Her right renal dimension was 11 cm pole to pole with mild to moderate right renal artery stenosis. A right renal aortic ratio was 3.22 suggesting less than a 50% stenosis.  . TONSILLECTOMY    . TRANSTHORACIC ECHOCARDIOGRAM  01/29/2016; 11/2016   2017: EF 55-60%, normal LV wall motion, grade I DD.  No cardiac source of emboli was seen.  2018: EF 35-40%, could not assess DD due to a-fib, pulm HTN noted.    MEDS:  Outpatient Medications Prior to Visit  Medication Sig Dispense Refill  . acetaminophen (TYLENOL) 500 MG tablet Take 500 mg by mouth every 6 (six) hours as needed for moderate pain.    [name] amiodarone (PACERONE) 200 MG tablet Take 1 tablet (200 mg total) by mouth daily. 30 tablet 0  . Calcium Carb-Cholecalciferol (CALCIUM/VITAMIN D PO) Take 1 tablet by mouth 2 (two) times daily.    . carvedilol (COREG) 25 MG tablet Take 1 tablet (25 mg total) by mouth 2 (two) times daily with a meal. 60 tablet 6  . folic acid (FOLVITE) 1 MG tablet Take 1 tablet (1 mg total) by mouth daily. 30 tablet 0  . isosorbide mononitrate (IMDUR) 60 MG 24 hr tablet Take 1 tablet (60 mg total) by mouth daily. 30 tablet 6  . Multiple Vitamin (MULTIVITAMIN) tablet Take 1 tablet by mouth 3 (three) times a week.     . ondansetron (ZOFRAN) 4 MG tablet Take 0.5-1  tablets (2-4 mg total) by mouth every 8 (eight) hours as needed for nausea or vomiting. 90 tablet 3  . oxyCODONE-acetaminophen (PERCOCET/ROXICET) 5-325 MG tablet Take 1-2 tablets by mouth every 6 (six) hours as needed for severe pain. 60 tablet 0  . polyethylene glycol (MIRALAX / GLYCOLAX) packet Take 17 g by mouth daily as needed. 10 each 0  . pravastatin (PRAVACHOL) 40 MG tablet Take 40 mg by mouth every evening.    . vitamin C (ASCORBIC ACID) 500 MG tablet Take 500 mg by mouth daily.    . Vitamin D, Ergocalciferol, (DRISDOL) 50000 units CAPS capsule 1 cap po q week x 12 weeks (Patient taking differently: Take 50,000 Units by mouth every 7 (seven) days. For 12 weeks. (Thursdays)) 12 capsule 1  . warfarin (COUMADIN) 3 MG tablet Take 1/2 to 1  tablet by mouth daily as directed by coumadin clinic (Patient taking differently: Take 1.5-3 mg by mouth See admin instructions. Take 1/2 tablet on Monday, Wednesday and Friday then take 1 tablet all the other days) 30 tablet 1   No facility-administered medications prior to visit.    EXAM: BP (!) 164/77 (BP Location: Left Arm, Patient Position: Sitting, Cuff Size: Normal)   Pulse 75   Temp 97.6 F (36.4 C) (Oral)   Resp 16   Ht 4\' 11"  (1.499 m)   Wt 104 lb 8 oz (47.4 kg)   SpO2 92%   BMI 21.11 kg/m  Gen: Alert, well appearing.  Patient is oriented to person, place, time, and situation. AFFECT: pleasant, lucid thought and speech. CV: RRR, no m/r/g.   LUNGS: CTA bilat, nonlabored resps, good aeration in all lung fields. EXT: no clubbing or cyanosis.  Trace-1+ pitting edema bilat LL's. Mid T spine with focal tenderness one of R facet joint regions.    Pertinent labs/imaging  Lab Results  Component Value Date   INR 3.34 02/18/2017   INR 2.36 02/17/2017   INR 2.48 02/16/2017    Lab Results  Component Value Date   TSH 3.563 12/15/2016   Lab Results  Component Value Date   WBC 9.9 02/18/2017   HGB 12.6 02/18/2017   HCT 40.6 02/18/2017    MCV 94.9 02/18/2017   PLT 171 02/18/2017   Lab Results  Component Value Date   CREATININE 4.08 (H) 02/18/2017   BUN 38 (H) 02/18/2017   NA 129 (L) 02/18/2017   K 4.8 02/18/2017   CL 92 (L) 02/18/2017   CO2 22 02/18/2017   Lab Results  Component Value Date   ALT 23 02/13/2017   AST 34 02/13/2017   ALKPHOS 89 02/13/2017   BILITOT 0.6 02/13/2017   Lab Results  Component Value Date   CHOL 140 06/17/2016   Lab Results  Component Value Date   HDL 39 (L) 06/17/2016   Lab Results  Component Value Date   LDLCALC 72 06/17/2016   Lab Results  Component Value Date   TRIG 146 06/17/2016   Lab Results  Component Value Date   CHOLHDL 3.6 06/17/2016   Lab Results  Component Value Date   HGBA1C 5.2 12/15/2016   Lab Results  Component Value Date   TROPONINI 0.04 ([place]) 02/14/2017   BNP    Component Value Date/Time   BNP 1,035.7 (H) 02/13/2017 1357    ASSESSMENT/PLAN:  1) Acute hypoxic resp failure/pulm edema/volume overload: resolved with aggressive HD in hospital.  Wt 105 lbs, down 5 lbs compared to 3 weeks ago.  Breathing good, no oxygen requirement at this time. Next HD tomorrow---sounds like nephrology is urging her towards replacing her IJ catheter with AV fistula in near future for permanent HD access.  2) ESRD on HD: continue T/Th/Sat HD, approp renal f/u. Hyperphosphatemia tx recently with sevelamer.  Routine labs at HD.  3) HTN: not ideal control, but with hx of labile control and now with rapid fluid fluctuations, will lean towards leaving her a little on the high side. No med changes today.  4) A-fib, chronic anticoag with coumadin.  Recent INR---unclear, exactly how high and exactly when, but will recheck INR today. She is to continue to get routine INR monitoring through HD and coumadin clinic.  5) Mid back pain in facet joint region, hx of osteoporotic compression fracture with subsequent kyphoplasty procedure. Continue current percocet use--takes avg 2  of these tabs per  day.  New rx for percocet 5/325, 1-bid prn, #60. She'll see Dr. Nelva Bush for initial visit very soon.  An After Visit Summary was printed and given to the patient.  Medical decision making of moderate complexity was utilized today.  FOLLOW UP:  1 mo  Signed:  Crissie Sickles, MD           03/03/2017

## 2017-03-04 ENCOUNTER — Encounter: Payer: Self-pay | Admitting: Family Medicine

## 2017-03-04 DIAGNOSIS — Z4901 Encounter for fitting and adjustment of extracorporeal dialysis catheter: Secondary | ICD-10-CM | POA: Diagnosis not present

## 2017-03-04 DIAGNOSIS — D631 Anemia in chronic kidney disease: Secondary | ICD-10-CM | POA: Diagnosis not present

## 2017-03-04 DIAGNOSIS — D689 Coagulation defect, unspecified: Secondary | ICD-10-CM | POA: Diagnosis not present

## 2017-03-04 DIAGNOSIS — E876 Hypokalemia: Secondary | ICD-10-CM | POA: Diagnosis not present

## 2017-03-04 DIAGNOSIS — I4891 Unspecified atrial fibrillation: Secondary | ICD-10-CM | POA: Diagnosis not present

## 2017-03-04 DIAGNOSIS — N186 End stage renal disease: Secondary | ICD-10-CM | POA: Diagnosis not present

## 2017-03-05 ENCOUNTER — Telehealth: Payer: Self-pay | Admitting: *Deleted

## 2017-03-05 DIAGNOSIS — N186 End stage renal disease: Secondary | ICD-10-CM | POA: Diagnosis not present

## 2017-03-05 DIAGNOSIS — M47814 Spondylosis without myelopathy or radiculopathy, thoracic region: Secondary | ICD-10-CM | POA: Diagnosis not present

## 2017-03-05 DIAGNOSIS — J9621 Acute and chronic respiratory failure with hypoxia: Secondary | ICD-10-CM | POA: Diagnosis not present

## 2017-03-05 DIAGNOSIS — I5041 Acute combined systolic (congestive) and diastolic (congestive) heart failure: Secondary | ICD-10-CM | POA: Diagnosis not present

## 2017-03-05 DIAGNOSIS — I5043 Acute on chronic combined systolic (congestive) and diastolic (congestive) heart failure: Secondary | ICD-10-CM | POA: Diagnosis not present

## 2017-03-05 DIAGNOSIS — M5134 Other intervertebral disc degeneration, thoracic region: Secondary | ICD-10-CM | POA: Diagnosis not present

## 2017-03-05 DIAGNOSIS — Z789 Other specified health status: Secondary | ICD-10-CM | POA: Diagnosis not present

## 2017-03-05 DIAGNOSIS — R5381 Other malaise: Secondary | ICD-10-CM | POA: Diagnosis not present

## 2017-03-05 DIAGNOSIS — J9601 Acute respiratory failure with hypoxia: Secondary | ICD-10-CM | POA: Diagnosis not present

## 2017-03-05 NOTE — Telephone Encounter (Signed)
Volney Presser, PT with [facility] called to let Dr. Anitra Lauth know that pt cancelled her PT for today because she was getting injections in her back. No call back needed.

## 2017-03-05 NOTE — Telephone Encounter (Signed)
Noted  

## 2017-03-06 DIAGNOSIS — D689 Coagulation defect, unspecified: Secondary | ICD-10-CM | POA: Diagnosis not present

## 2017-03-06 DIAGNOSIS — E876 Hypokalemia: Secondary | ICD-10-CM | POA: Diagnosis not present

## 2017-03-06 DIAGNOSIS — Z4901 Encounter for fitting and adjustment of extracorporeal dialysis catheter: Secondary | ICD-10-CM | POA: Diagnosis not present

## 2017-03-06 DIAGNOSIS — D631 Anemia in chronic kidney disease: Secondary | ICD-10-CM | POA: Diagnosis not present

## 2017-03-06 DIAGNOSIS — N186 End stage renal disease: Secondary | ICD-10-CM | POA: Diagnosis not present

## 2017-03-06 DIAGNOSIS — I4891 Unspecified atrial fibrillation: Secondary | ICD-10-CM | POA: Diagnosis not present

## 2017-03-08 ENCOUNTER — Telehealth: Payer: Self-pay | Admitting: Family Medicine

## 2017-03-08 ENCOUNTER — Ambulatory Visit: Payer: Self-pay | Admitting: Family Medicine

## 2017-03-08 DIAGNOSIS — M25551 Pain in right hip: Secondary | ICD-10-CM | POA: Diagnosis not present

## 2017-03-08 NOTE — Telephone Encounter (Signed)
Rande Lawman, PT with [facility] calling to notify pcp of missed appt today due to patient complaining of severe hip pain.  Patient is seeing ortho.  No recent fall.  Anticipating patient will have 1-2 missed visits this week due to hip pain.

## 2017-03-08 NOTE — Telephone Encounter (Signed)
Noted  

## 2017-03-09 ENCOUNTER — Other Ambulatory Visit: Payer: Self-pay

## 2017-03-09 ENCOUNTER — Other Ambulatory Visit: Payer: Self-pay | Admitting: Physical Medicine and Rehabilitation

## 2017-03-09 DIAGNOSIS — I5041 Acute combined systolic (congestive) and diastolic (congestive) heart failure: Secondary | ICD-10-CM | POA: Diagnosis not present

## 2017-03-09 DIAGNOSIS — I4891 Unspecified atrial fibrillation: Secondary | ICD-10-CM | POA: Diagnosis not present

## 2017-03-09 DIAGNOSIS — N186 End stage renal disease: Secondary | ICD-10-CM | POA: Diagnosis not present

## 2017-03-09 DIAGNOSIS — M25559 Pain in unspecified hip: Secondary | ICD-10-CM

## 2017-03-09 DIAGNOSIS — D689 Coagulation defect, unspecified: Secondary | ICD-10-CM | POA: Diagnosis not present

## 2017-03-09 DIAGNOSIS — E876 Hypokalemia: Secondary | ICD-10-CM | POA: Diagnosis not present

## 2017-03-09 DIAGNOSIS — D631 Anemia in chronic kidney disease: Secondary | ICD-10-CM | POA: Diagnosis not present

## 2017-03-09 DIAGNOSIS — Z4901 Encounter for fitting and adjustment of extracorporeal dialysis catheter: Secondary | ICD-10-CM | POA: Diagnosis not present

## 2017-03-09 NOTE — Patient Outreach (Signed)
[facility]) Care Management  03/09/2017  Valerie Terrell [birth year] [id number]   Care Coordination: RNCM has attempted outreach to client numerous times with no response. Outreach letter sent 02/24/17. RNCM called to [facility] re: follow up. No answer. HIPPA compliant message left.  Thea Silversmith, RN, MSN, [place] Coordinator Cell: [phone]

## 2017-03-10 ENCOUNTER — Other Ambulatory Visit: Payer: Self-pay

## 2017-03-10 ENCOUNTER — Encounter: Payer: Self-pay | Admitting: Internal Medicine

## 2017-03-10 ENCOUNTER — Encounter (INDEPENDENT_AMBULATORY_CARE_PROVIDER_SITE_OTHER): Payer: Self-pay

## 2017-03-10 ENCOUNTER — Ambulatory Visit (INDEPENDENT_AMBULATORY_CARE_PROVIDER_SITE_OTHER): Payer: Medicare Other | Admitting: Internal Medicine

## 2017-03-10 VITALS — BP 113/67 | HR 60 | Ht 59.0 in | Wt 103.0 lb

## 2017-03-10 DIAGNOSIS — N186 End stage renal disease: Secondary | ICD-10-CM | POA: Diagnosis not present

## 2017-03-10 DIAGNOSIS — I480 Paroxysmal atrial fibrillation: Secondary | ICD-10-CM | POA: Diagnosis not present

## 2017-03-10 DIAGNOSIS — I495 Sick sinus syndrome: Secondary | ICD-10-CM | POA: Diagnosis not present

## 2017-03-10 DIAGNOSIS — Z950 Presence of cardiac pacemaker: Secondary | ICD-10-CM | POA: Diagnosis not present

## 2017-03-10 DIAGNOSIS — D638 Anemia in other chronic diseases classified elsewhere: Secondary | ICD-10-CM | POA: Diagnosis not present

## 2017-03-10 DIAGNOSIS — Z9981 Dependence on supplemental oxygen: Secondary | ICD-10-CM | POA: Diagnosis not present

## 2017-03-10 DIAGNOSIS — I5043 Acute on chronic combined systolic (congestive) and diastolic (congestive) heart failure: Secondary | ICD-10-CM | POA: Diagnosis not present

## 2017-03-10 DIAGNOSIS — I130 Hypertensive heart and chronic kidney disease with heart failure and stage 1 through stage 4 chronic kidney disease, or unspecified chronic kidney disease: Secondary | ICD-10-CM | POA: Diagnosis not present

## 2017-03-10 DIAGNOSIS — J9621 Acute and chronic respiratory failure with hypoxia: Secondary | ICD-10-CM | POA: Diagnosis not present

## 2017-03-10 DIAGNOSIS — Z79891 Long term (current) use of opiate analgesic: Secondary | ICD-10-CM | POA: Diagnosis not present

## 2017-03-10 DIAGNOSIS — Z5181 Encounter for therapeutic drug level monitoring: Secondary | ICD-10-CM | POA: Diagnosis not present

## 2017-03-10 DIAGNOSIS — I739 Peripheral vascular disease, unspecified: Secondary | ICD-10-CM | POA: Diagnosis not present

## 2017-03-10 DIAGNOSIS — E785 Hyperlipidemia, unspecified: Secondary | ICD-10-CM | POA: Diagnosis not present

## 2017-03-10 DIAGNOSIS — I255 Ischemic cardiomyopathy: Secondary | ICD-10-CM | POA: Diagnosis not present

## 2017-03-10 NOTE — Patient Outreach (Signed)
[facility]) Care Management  03/10/2017  Valerie Terrell 06-May-1933 [id number]  Case Closure: RNCM has attempted outreach to client numerous times with no response. Outreach letter sent 02/24/17 with no response.  Plan: close case.  Thea Silversmith, RN, MSN, [place] Coordinator Cell: [phone]

## 2017-03-10 NOTE — Patient Instructions (Addendum)
Medication Instructions:  Your physician recommends that you continue on your current medications as directed. Please refer to the Current Medication list given to you today.  Labwork: None ordered.  Testing/Procedures: None ordered.  Follow-Up: Your physician wants you to follow-up in: 9 months with Dr. Lovena Le.   You will receive a reminder letter in the mail two months in advance. If you don't receive a letter, please call our office to schedule the follow-up appointment.  Remote monitoring is used to monitor your Pacemaker from home. This monitoring reduces the number of office visits required to check your device to one time per year. It allows [place] to keep an eye on the functioning of your device to ensure it is working properly. You are scheduled for a device check from home on 06/09/2016. You may send your transmission at any time that day. If you have a wireless device, the transmission will be sent automatically. After your physician reviews your transmission, you will receive a postcard with your next transmission date.    Any Other Special Instructions Will Be Listed Below (If Applicable).     If you need a refill on your cardiac medications before your next appointment, please call your pharmacy.

## 2017-03-10 NOTE — Progress Notes (Signed)
HPI Mrs. Segler returns today for ongoing evaluation and management of her atrial fibrillation, symptomatic bradycardia, status post pacemaker insertion. She underwent permanent pacemaker insertion approximately 3 months ago. The patient initially did well but then developed worsening renal insufficiency and ultimately underwent initiation of dialysis several weeks ago. She has an indwelling right internal jugular catheter placed. She has tolerated her dialysis well. She still does not have a fistula. She does not have any chest pain or shortness of breath, and states that her pacemaker is not bothersome to her at all. No syncope. Allergies  Allergen Reactions  . Clonidine Derivatives Palpitations and Other (See Comments)    Very sedated  . Codeine Nausea Only  . Nickel Rash  . Sulfa Antibiotics Other (See Comments)    Reaction unknown     Current Outpatient Prescriptions  Medication Sig Dispense Refill  . acetaminophen (TYLENOL) 500 MG tablet Take 500 mg by mouth every 6 (six) hours as needed for moderate pain.    [name] amiodarone (PACERONE) 200 MG tablet Take 1 tablet (200 mg total) by mouth daily. 30 tablet 0  . Calcium Carb-Cholecalciferol (CALCIUM/VITAMIN D PO) Take 1 tablet by mouth 2 (two) times daily.    . carvedilol (COREG) 25 MG tablet Take 1 tablet (25 mg total) by mouth 2 (two) times daily with a meal. 60 tablet 6  . folic acid (FOLVITE) 1 MG tablet Take 1 tablet (1 mg total) by mouth daily. 30 tablet 0  . isosorbide mononitrate (IMDUR) 60 MG 24 hr tablet Take 1 tablet (60 mg total) by mouth daily. 30 tablet 6  . Multiple Vitamin (MULTIVITAMIN) tablet Take 1 tablet by mouth 3 (three) times a week.     . ondansetron (ZOFRAN) 4 MG tablet Take 0.5-1 tablets (2-4 mg total) by mouth every 8 (eight) hours as needed for nausea or vomiting. 90 tablet 3  . oxyCODONE-acetaminophen (PERCOCET/ROXICET) 5-325 MG tablet Take 1-2 tablets by mouth every 6 (six) hours as needed for severe  pain. 60 tablet 0  . polyethylene glycol (MIRALAX / GLYCOLAX) packet Take 17 g by mouth daily as needed. 10 each 0  . pravastatin (PRAVACHOL) 40 MG tablet Take 40 mg by mouth every evening.    . sevelamer carbonate (RENVELA) 800 MG tablet Take 800 mg by mouth 2 (two) times daily with a meal.    . vitamin C (ASCORBIC ACID) 500 MG tablet Take 500 mg by mouth daily.    . Vitamin D, Ergocalciferol, (DRISDOL) 50000 units CAPS capsule Take 50,000 Units by mouth every 7 (seven) days.    [name] warfarin (COUMADIN) 3 MG tablet Take 1/2 to 1 tablet by mouth daily as directed by coumadin clinic (Patient taking differently: Take 1.5-3 mg by mouth See admin instructions. Take 1/2 tablet on Monday, Wednesday and Friday then take 1 tablet all the other days) 30 tablet 1   No current facility-administered medications for this visit.      Past Medical History:  Diagnosis Date  . Anemia of chronic disease 2018   + anemia of CRI?  [name] Atrophy of left kidney    with absent blood flow by renal artery dopplers (Dr. Gwenlyn Found)  . 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 Chronic combined systolic and diastolic CHF (congestive heart failure) ([place])    a. 11/2016: echo showing EF of 35-40%, RV strain noted, mild MR and mild TR.   [name] Chronic hypoxemic respiratory failure ([place])   . Debilitated patient  WC dependent as of 2018  . ESRD on hemodialysis ([place]) 01/2017   T/Th/Sat schedule  . History of adenomatous polyp of colon   . History of subarachnoid hemorrhage 10/2014   after syncope and while on xarelto  . Hyperlipidemia   . Hypertension    Difficult to control, in the setting of one functioning kidney: pt was referred to nephrology by Dr. Gwenlyn Found 06/2015.  . Lumbar radiculopathy 2012  . Lumbar spondylosis    MR 07/2016---no sign of spinal nerve compression or cord compression.  Pt set up with outpt ortho while admitted to hosp 07/2016.  . Malnourished ([place])   . Metatarsal fracture 06/10/2016   Nondisplaced,  left 5th metatarsal--pt was referred to ortho  . Nonischemic cardiomyopathy ([place])   . Osteopenia 2014   T-score -2.1  . PAF (paroxysmal atrial fibrillation) (HCC)    Eliquis started after BRAO and CVA.  She was changed to warfarin 2018--INR/coumadin mgmt per HD/nephrol.  . Pulmonary edema 11/2016  . Right rib fracture 12/2016   s/p fall  . Stroke [facility])    cardioembolic (had CVA while on no anticoag)--"scattered subacute punctate infarcts: 1 in R parietal lobe and 2 in occipital cortex" on MRI br.  CT angio head/neck: aortic arch athero.   . Thoracic back pain 01/2017   Facet?  Dr. Nelva Bush to do steroid injection as of 02/2017  . Thoracic compression fracture ([place]) 07/2016   T7 and T8-- T8 kyphoplasty during hosp admission 07/2016.  Neuro referred pt to pain mgmt for consideration of injection 09/2016.  I referred her to endo 08/2016 for consideration of calcitonin treatment.    ROS:   All systems reviewed and negative except as noted in the HPI.   Past Surgical History:  Procedure Laterality Date  . APPENDECTOMY  child  . CARDIOVASCULAR STRESS TEST  01/31/2016   Stress myoview: NORMAL/Low risk.  EF 56%.  . [place]  . COLONOSCOPY  2015   + hx of adenomatous polyps.  Need digest health spec in [place] records to see when pt due for next colonoscopy  . INSERTION OF DIALYSIS CATHETER Right 01/29/2017   Procedure: INSERTION OF DIALYSIS CATHETER- RIGHT INTERNAL JUGULAR;  Surgeon: [name], MD;  Location: [facility] OR;  Service: Vascular;  Laterality: Right;  . IR GENERIC HISTORICAL  07/24/2016   IR KYPHO THORACIC WITH BONE BIOPSY 07/24/2016 Luanne Bras, MD [facility]-INTERV RAD  . KYPHOPLASTY  07/27/2016   T8  . PACEMAKER IMPLANT N/A 12/03/2016   Procedure: Pacemaker Implant;  Surgeon: Evans Lance, MD;  Location: [place] CV LAB;  Service: Cardiovascular;  Laterality: N/A;  . Renal artery dopplers  02/26/2016   Her right renal dimension was 11 cm pole to pole with mild to  moderate right renal artery stenosis. A right renal aortic ratio was 3.22 suggesting less than a 50% stenosis.  . TONSILLECTOMY    . TRANSTHORACIC ECHOCARDIOGRAM  01/29/2016; 11/2016   2017: EF 55-60%, normal LV wall motion, grade I DD.  No cardiac source of emboli was seen.  2018: EF 35-40%, could not assess DD due to a-fib, pulm HTN noted.     Family History  Problem Relation Age of Onset  . Cancer Mother   . Heart disease Father   . Early death Father   . Sudden Cardiac Death Neg Hx      Social History   Social History  . Marital status: Widowed    Spouse name: N/A  . Number of  children: N/A  . Years of education: N/A   Occupational History  . Not on file.   Social History Main Topics  . Smoking status: Never Smoker  . Smokeless tobacco: Never Used  . Alcohol use 0.6 oz/week    1 Glasses of wine per week     Comment: wine  . Drug use: No  . Sexual activity: No   Other Topics Concern  . Not on file   Social History Narrative   Widow.  One daughter, lives with her.   Educ: college   Occup: retired Marine scientist.   No T/A/Ds.   She is almost a vegetarian.     BP 113/67   Pulse 60   Ht 4\' 11"  (1.499 m)   Wt 103 lb (46.7 kg)   BMI 20.80 kg/m   Physical Exam:  Well appearing NAD HEENT: Unremarkable Neck:  No JVD, no thyromegally Lymphatics:  No adenopathy Back:  No CVA tenderness Lungs:  Clear HEART:  Regular rate rhythm, no murmurs, no rubs, no clicks Abd:  soft, positive bowel sounds, no organomegally, no rebound, no guarding Ext:  2 plus pulses, no edema, no cyanosis, no clubbing Skin:  No rashes no nodules Neuro:  CN II through XII intact, motor grossly intact  EKG - normal sinus rhythm with atrial pacing  DEVICE  Normal device function.  See PaceArt for details.   Assess/Plan: 1. Paroxysmal atrial fibrillation - she appears to be maintaining sinus rhythm. She will continue warfarin. She will continue amiodarone therapy. 2. Pacemaker - her St. Jude  dual-chamber pacemaker appears to be working normally. We'll recheck in several months. 3. End-stage renal disease - the patient is currently dialyzing through an indwelling right internal jugular catheter. I strongly encouraged the patient to obtain dialysis access through a fistula as soon as possible to reduce the risk of pacemaker infection. 4. Hypertension - her blood pressure today is well controlled and has been so since initiation of dialysis.  Cristopher Peru, M.D.

## 2017-03-11 DIAGNOSIS — D631 Anemia in chronic kidney disease: Secondary | ICD-10-CM | POA: Diagnosis not present

## 2017-03-11 DIAGNOSIS — D689 Coagulation defect, unspecified: Secondary | ICD-10-CM | POA: Diagnosis not present

## 2017-03-11 DIAGNOSIS — Z4901 Encounter for fitting and adjustment of extracorporeal dialysis catheter: Secondary | ICD-10-CM | POA: Diagnosis not present

## 2017-03-11 DIAGNOSIS — I4891 Unspecified atrial fibrillation: Secondary | ICD-10-CM | POA: Diagnosis not present

## 2017-03-11 DIAGNOSIS — E876 Hypokalemia: Secondary | ICD-10-CM | POA: Diagnosis not present

## 2017-03-11 DIAGNOSIS — N186 End stage renal disease: Secondary | ICD-10-CM | POA: Diagnosis not present

## 2017-03-13 DIAGNOSIS — D631 Anemia in chronic kidney disease: Secondary | ICD-10-CM | POA: Diagnosis not present

## 2017-03-13 DIAGNOSIS — I4891 Unspecified atrial fibrillation: Secondary | ICD-10-CM | POA: Diagnosis not present

## 2017-03-13 DIAGNOSIS — E876 Hypokalemia: Secondary | ICD-10-CM | POA: Diagnosis not present

## 2017-03-13 DIAGNOSIS — Z4901 Encounter for fitting and adjustment of extracorporeal dialysis catheter: Secondary | ICD-10-CM | POA: Diagnosis not present

## 2017-03-13 DIAGNOSIS — N186 End stage renal disease: Secondary | ICD-10-CM | POA: Diagnosis not present

## 2017-03-13 DIAGNOSIS — D689 Coagulation defect, unspecified: Secondary | ICD-10-CM | POA: Diagnosis not present

## 2017-03-15 ENCOUNTER — Ambulatory Visit
Admission: RE | Admit: 2017-03-15 | Discharge: 2017-03-15 | Disposition: A | Discharge status: Home / Self Care | Payer: Medicare Other | Source: Ambulatory Visit | Attending: Physical Medicine and Rehabilitation | Admitting: Physical Medicine and Rehabilitation

## 2017-03-15 DIAGNOSIS — M25552 Pain in left hip: Secondary | ICD-10-CM | POA: Diagnosis not present

## 2017-03-15 DIAGNOSIS — M25551 Pain in right hip: Secondary | ICD-10-CM | POA: Diagnosis not present

## 2017-03-15 DIAGNOSIS — M25559 Pain in unspecified hip: Secondary | ICD-10-CM

## 2017-03-15 DIAGNOSIS — M160 Bilateral primary osteoarthritis of hip: Secondary | ICD-10-CM | POA: Diagnosis not present

## 2017-03-16 ENCOUNTER — Encounter: Payer: Self-pay | Admitting: Family Medicine

## 2017-03-16 DIAGNOSIS — Z4901 Encounter for fitting and adjustment of extracorporeal dialysis catheter: Secondary | ICD-10-CM | POA: Diagnosis not present

## 2017-03-16 DIAGNOSIS — I4891 Unspecified atrial fibrillation: Secondary | ICD-10-CM | POA: Diagnosis not present

## 2017-03-16 DIAGNOSIS — D631 Anemia in chronic kidney disease: Secondary | ICD-10-CM | POA: Diagnosis not present

## 2017-03-16 DIAGNOSIS — D689 Coagulation defect, unspecified: Secondary | ICD-10-CM | POA: Diagnosis not present

## 2017-03-16 DIAGNOSIS — E876 Hypokalemia: Secondary | ICD-10-CM | POA: Diagnosis not present

## 2017-03-16 DIAGNOSIS — N186 End stage renal disease: Secondary | ICD-10-CM | POA: Diagnosis not present

## 2017-03-17 ENCOUNTER — Encounter: Payer: Self-pay | Admitting: Vascular Surgery

## 2017-03-17 ENCOUNTER — Ambulatory Visit (INDEPENDENT_AMBULATORY_CARE_PROVIDER_SITE_OTHER): Payer: Medicare Other | Admitting: Vascular Surgery

## 2017-03-17 ENCOUNTER — Ambulatory Visit (HOSPITAL_COMMUNITY)
Admission: RE | Admit: 2017-03-17 | Discharge: 2017-03-17 | Disposition: A | Discharge status: Home / Self Care | Payer: Medicare Other | Source: Ambulatory Visit | Attending: Vascular Surgery | Admitting: Vascular Surgery

## 2017-03-17 ENCOUNTER — Encounter: Payer: Self-pay | Admitting: *Deleted

## 2017-03-17 ENCOUNTER — Ambulatory Visit (INDEPENDENT_AMBULATORY_CARE_PROVIDER_SITE_OTHER)
Admission: RE | Admit: 2017-03-17 | Discharge: 2017-03-17 | Disposition: A | Discharge status: Home / Self Care | Payer: Medicare Other | Source: Ambulatory Visit | Attending: Vascular Surgery | Admitting: Vascular Surgery

## 2017-03-17 VITALS — BP 135/73 | HR 71 | Resp 18 | Ht 59.0 in | Wt 90.0 lb

## 2017-03-17 DIAGNOSIS — Z992 Dependence on renal dialysis: Secondary | ICD-10-CM | POA: Diagnosis not present

## 2017-03-17 DIAGNOSIS — Z01810 Encounter for preprocedural cardiovascular examination: Secondary | ICD-10-CM

## 2017-03-17 DIAGNOSIS — I120 Hypertensive chronic kidney disease with stage 5 chronic kidney disease or end stage renal disease: Secondary | ICD-10-CM | POA: Diagnosis not present

## 2017-03-17 DIAGNOSIS — N186 End stage renal disease: Secondary | ICD-10-CM | POA: Insufficient documentation

## 2017-03-17 NOTE — Progress Notes (Signed)
Referring physician: Tammi Sou, MD [address] [place], [place] [address]  Reason for referral: evaluation for permanent access  History of Present Illness:    Valerie Terrell is a 83 y.o. ([birth year]) female who presents for placement of a permanent hemodialysis access. The patient is right handed.  The patient is currently on hemodialysis via right tunneled catheter.   Other chronic medical problems include A fib on coumadin, Stroke, HTN, hyperlipidemia.  There was some thought that the pt might get renal recovery so she did not have permanent access placed during her prior hospitalization.  Past Medical History:  Diagnosis Date  . Anemia of chronic disease 2018   + anemia of CRI?  [name] Atrophy of left kidney    with absent blood flow by renal artery dopplers (Dr. Gwenlyn Found)  . 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 Chronic combined systolic and diastolic CHF (congestive heart failure) ([place])    a. 11/2016: echo showing EF of 35-40%, RV strain noted, mild MR and mild TR.   [name] Chronic hypoxemic respiratory failure ([place])   . Debilitated patient    WC dependent as of 2018  . ESRD on hemodialysis ([place]) 01/2017   T/Th/Sat schedule  . History of adenomatous polyp of colon   . History of subarachnoid hemorrhage 10/2014   after syncope and while on xarelto  . Hyperlipidemia   . Hypertension    Difficult to control, in the setting of one functioning kidney: pt was referred to nephrology by Dr. Gwenlyn Found 06/2015.  . Lumbar radiculopathy 2012  . Lumbar spondylosis    MR 07/2016---no sign of spinal nerve compression or cord compression.  Pt set up with outpt ortho while admitted to hosp 07/2016.  . Malnourished ([place])   . Metatarsal fracture 06/10/2016   Nondisplaced, left 5th metatarsal--pt was referred to ortho  . Nonischemic cardiomyopathy ([place])   . Osteopenia 2014   T-score -2.1  . PAF (paroxysmal atrial fibrillation) (HCC)    Eliquis started after BRAO and CVA.  She  was changed to warfarin 2018--INR/coumadin mgmt per HD/nephrol.  . Pulmonary edema 11/2016  . Right rib fracture 12/2016   s/p fall  . Stroke [facility])    cardioembolic (had CVA while on no anticoag)--"scattered subacute punctate infarcts: 1 in R parietal lobe and 2 in occipital cortex" on MRI br.  CT angio head/neck: aortic arch athero.   . Symptomatic bradycardia    Dual chamber pacer insertion 2018 (Dr. Lovena Le)  . Thoracic back pain 01/2017   Facet?  Dr. Nelva Bush to do steroid injection as of 02/2017  . Thoracic compression fracture ([place]) 07/2016   T7 and T8-- T8 kyphoplasty during hosp admission 07/2016.  Neuro referred pt to pain mgmt for consideration of injection 09/2016.  I referred her to endo 08/2016 for consideration of calcitonin treatment.    Past Surgical History:  Procedure Laterality Date  . APPENDECTOMY  child  . CARDIOVASCULAR STRESS TEST  01/31/2016   Stress myoview: NORMAL/Low risk.  EF 56%.  . [place]  . COLONOSCOPY  2015   + hx of adenomatous polyps.  Need digest health spec in [place] records to see when pt due for next colonoscopy  . INSERTION OF DIALYSIS CATHETER Right 01/29/2017   Procedure: INSERTION OF DIALYSIS CATHETER- RIGHT INTERNAL JUGULAR;  Surgeon: [name], MD;  Location: [facility];  Service: Vascular;  Laterality: Right;  . IR GENERIC HISTORICAL  07/24/2016  IR KYPHO THORACIC WITH BONE BIOPSY 07/24/2016 Luanne Bras, MD [facility]  . KYPHOPLASTY  07/27/2016   T8  . PACEMAKER IMPLANT N/A 12/03/2016   Procedure: Pacemaker Implant;  Surgeon: Evans Lance, MD;  Location: [facility] CV LAB;  Service: Cardiovascular;  Laterality: N/A;  . Renal artery dopplers  02/26/2016   Her right renal dimension was 11 cm pole to pole with mild to moderate right renal artery stenosis. A right renal aortic ratio was 3.22 suggesting less than a 50% stenosis.  . TONSILLECTOMY    . TRANSTHORACIC ECHOCARDIOGRAM  01/29/2016; 11/2016   2017: EF 55-60%, normal LV  wall motion, grade I DD.  No cardiac source of emboli was seen.  2018: EF 35-40%, could not assess DD due to a-fib, pulm HTN noted.     Social History Social History  Substance Use Topics  . Smoking status: Former Smoker    Quit date: 03/17/1972  . Smokeless tobacco: Never Used  . Alcohol use 0.6 oz/week    1 Glasses of wine per week     Comment: wine    Family History Family History  Problem Relation Age of Onset  . Cancer Mother   . Heart disease Father   . Early death Father   . Sudden Cardiac Death Neg Hx     Allergies  Allergies  Allergen Reactions  . Clonidine Derivatives Palpitations and Other (See Comments)    Very sedated  . Codeine Nausea Only  . Nickel Rash  . Sulfa Antibiotics Other (See Comments)    Reaction unknown     Current Outpatient Prescriptions  Medication Sig Dispense Refill  . acetaminophen (TYLENOL) 500 MG tablet Take 500 mg by mouth every 6 (six) hours as needed for moderate pain.    [name] amiodarone (PACERONE) 200 MG tablet Take 1 tablet (200 mg total) by mouth daily. 30 tablet 0  . Calcium Carb-Cholecalciferol (CALCIUM/VITAMIN D PO) Take 1 tablet by mouth 2 (two) times daily.    . carvedilol (COREG) 25 MG tablet Take 1 tablet (25 mg total) by mouth 2 (two) times daily with a meal. 60 tablet 6  . folic acid (FOLVITE) 1 MG tablet Take 1 tablet (1 mg total) by mouth daily. 30 tablet 0  . isosorbide mononitrate (IMDUR) 60 MG 24 hr tablet Take 1 tablet (60 mg total) by mouth daily. 30 tablet 6  . Multiple Vitamin (MULTIVITAMIN) tablet Take 1 tablet by mouth 3 (three) times a week.     . ondansetron (ZOFRAN) 4 MG tablet Take 0.5-1 tablets (2-4 mg total) by mouth every 8 (eight) hours as needed for nausea or vomiting. 90 tablet 3  . oxyCODONE-acetaminophen (PERCOCET/ROXICET) 5-325 MG tablet Take 1-2 tablets by mouth every 6 (six) hours as needed for severe pain. 60 tablet 0  . polyethylene glycol (MIRALAX / GLYCOLAX) packet Take 17 g by mouth daily as  needed. 10 each 0  . pravastatin (PRAVACHOL) 40 MG tablet Take 40 mg by mouth every evening.    . sevelamer carbonate (RENVELA) 800 MG tablet Take 800 mg by mouth 2 (two) times daily with a meal.    . vitamin C (ASCORBIC ACID) 500 MG tablet Take 500 mg by mouth daily.    . Vitamin D, Ergocalciferol, (DRISDOL) 50000 units CAPS capsule Take 50,000 Units by mouth every 7 (seven) days.    [name] warfarin (COUMADIN) 3 MG tablet Take 1/2 to 1 tablet by mouth daily as directed by coumadin clinic (Patient taking differently: Take 1.5-3  mg by mouth See admin instructions. Take 1/2 tablet on Monday, Wednesday and Friday then take 1 tablet all the other days) 30 tablet 1   No current facility-administered medications for this visit.     ROS:   General:  No weight loss, Fever, chills  HEENT: No recent headaches, no nasal bleeding, no visual changes, no sore throat  Neurologic: No dizziness, blackouts, seizures. No recent symptoms of stroke or mini- stroke. No recent episodes of slurred speech, or temporary blindness.  Cardiac: No recent episodes of chest pain/pressure, no shortness of breath at rest.  positive shortness of breath with exertion.  positive history of atrial fibrillation or irregular heartbeat  Vascular: No history of rest pain in feet.  No history of claudication.  No history of non-healing ulcer, No history of DVT   Pulmonary: No home oxygen, no productive cough, no hemoptysis,  No asthma or wheezing  Musculoskeletal:  [x ] Arthritis, [x ] Low back pain,  [ ]  Joint pain  Hematologic:No history of hypercoagulable state.  No history of easy bleeding.  No history of anemia  Gastrointestinal: No hematochezia or melena,  No gastroesophageal reflux, no trouble swallowing  Urinary: [ ]  chronic Kidney disease, [ ]  on HD - [ ]  MWF or [ ]  TTHS, [ ]  Burning with urination, [ ]  Frequent urination, [ ]  Difficulty urinating;   Skin: No rashes  Psychological: No history of anxiety,  No history of  depression   Physical Examination  Vitals:   03/17/17 1347  BP: 135/73  Pulse: 71  Resp: 18  SpO2: 95%  Weight: 90 lb (40.8 kg)  Height: 4\' 11"  (1.499 m)    Body mass index is 18.18 kg/m.  General:  Alert and oriented, no acute distress HEENT: Normal Neck: No bruit or JVD Pulmonary: Clear to auscultation bilaterally Cardiac: Regular Rate and Rhythm without murmur Gastrointestinal: Soft, non-tender, non-distended, no mass, no scars Skin: No rash Extremity Pulses:  2+ radial, brachial pulses bilaterally Musculoskeletal: No deformity or edema  Neurologic: Upper and lower extremity motor 5/5 and symmetric Psychiatric: Judgment intact, Mood & affect appropriate for pt's clinical situation Lymph : No Cervical, Axillary, or Inguinal lymphadenopathy   DATA:  Left basilic is acceptable for fistula creation vein mapping reviewed 03/17/2017 Left UE arterial flow is triphasic  ASSESSMENT:  ESRD on HD via left [facility]  PLAN: Left basilic single stage fistula creation by Dr. Bridgett Larsson Nov. 14/2018.  She will be instructed to hold her Coumadin for 5 days prior to the procedure.  Risk of surgery was discussed in the office today.  Valerie Terrell, Valerie Terrell MAUREEN PA-C [facility]  The patient was seen in conjunction with Dr. Bridgett Larsson today  Addendum  I have independently interviewed and examined the patient, and I agree with the physician assistant's findings.  Pt's B basilic veins are adequate for single stage BVT.  Would start with L single stage BVT.   Risk, benefits, and alternatives to access surgery were discussed.    The patient is aware the risks include but are not limited to: bleeding, infection, steal syndrome, nerve damage, ischemic monomelic neuropathy, thrombosis, failure to mature, complications related to venous hypertension, need for additional procedures, death and stroke.    The patient agrees to proceed forward with the procedure.  Will schedule her  on 14 NOV 18.   Adele Barthel, MD, FACS [facility] [place] Office: [phone] Pager: [phone]  03/17/2017, 3:57 PM

## 2017-03-17 NOTE — H&P (View-Only) (Signed)
Referring physician: Tammi Sou, MD [address] [place], [place] [address]  Reason for referral: evaluation for permanent access  History of Present Illness:    Valerie Terrell is a 83 y.o. (14-Feb-1933) female who presents for placement of a permanent hemodialysis access. The patient is right handed.  The patient is currently on hemodialysis via right tunneled catheter.   Other chronic medical problems include A fib on coumadin, Stroke, HTN, hyperlipidemia.  There was some thought that the pt might get renal recovery so she did not have permanent access placed during her prior hospitalization.  Past Medical History:  Diagnosis Date  . Anemia of chronic disease 2018   + anemia of CRI?  [name] Atrophy of left kidney    with absent blood flow by renal artery dopplers (Dr. Gwenlyn Found)  . 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 Chronic combined systolic and diastolic CHF (congestive heart failure) ([place])    a. 11/2016: echo showing EF of 35-40%, RV strain noted, mild MR and mild TR.   [name] Chronic hypoxemic respiratory failure ([place])   . Debilitated patient    WC dependent as of 2018  . ESRD on hemodialysis ([place]) 01/2017   T/Th/Sat schedule  . History of adenomatous polyp of colon   . History of subarachnoid hemorrhage 10/2014   after syncope and while on xarelto  . Hyperlipidemia   . Hypertension    Difficult to control, in the setting of one functioning kidney: pt was referred to nephrology by Dr. Gwenlyn Found 06/2015.  . Lumbar radiculopathy 2012  . Lumbar spondylosis    MR 07/2016---no sign of spinal nerve compression or cord compression.  Pt set up with outpt ortho while admitted to hosp 07/2016.  . Malnourished ([place])   . Metatarsal fracture 06/10/2016   Nondisplaced, left 5th metatarsal--pt was referred to ortho  . Nonischemic cardiomyopathy ([place])   . Osteopenia 2014   T-score -2.1  . PAF (paroxysmal atrial fibrillation) (HCC)    Eliquis started after BRAO and CVA.  She  was changed to warfarin 2018--INR/coumadin mgmt per HD/nephrol.  . Pulmonary edema 11/2016  . Right rib fracture 12/2016   s/p fall  . Stroke [facility])    cardioembolic (had CVA while on no anticoag)--"scattered subacute punctate infarcts: 1 in R parietal lobe and 2 in occipital cortex" on MRI br.  CT angio head/neck: aortic arch athero.   . Symptomatic bradycardia    Dual chamber pacer insertion 2018 (Dr. Lovena Le)  . Thoracic back pain 01/2017   Facet?  Dr. Nelva Bush to do steroid injection as of 02/2017  . Thoracic compression fracture ([place]) 07/2016   T7 and T8-- T8 kyphoplasty during hosp admission 07/2016.  Neuro referred pt to pain mgmt for consideration of injection 09/2016.  I referred her to endo 08/2016 for consideration of calcitonin treatment.    Past Surgical History:  Procedure Laterality Date  . APPENDECTOMY  child  . CARDIOVASCULAR STRESS TEST  01/31/2016   Stress myoview: NORMAL/Low risk.  EF 56%.  . [place]  . COLONOSCOPY  2015   + hx of adenomatous polyps.  Need digest health spec in [facility] records to see when pt due for next colonoscopy  . INSERTION OF DIALYSIS CATHETER Right 01/29/2017   Procedure: INSERTION OF DIALYSIS CATHETER- RIGHT INTERNAL JUGULAR;  Surgeon: [name], MD;  Location: [facility];  Service: Vascular;  Laterality: Right;  . IR GENERIC HISTORICAL  07/24/2016  IR KYPHO THORACIC WITH BONE BIOPSY 07/24/2016 Luanne Bras, MD [facility]  . KYPHOPLASTY  07/27/2016   T8  . PACEMAKER IMPLANT N/A 12/03/2016   Procedure: Pacemaker Implant;  Surgeon: Evans Lance, MD;  Location: [facility];  Service: Cardiovascular;  Laterality: N/A;  . Renal artery dopplers  02/26/2016   Her right renal dimension was 11 cm pole to pole with mild to moderate right renal artery stenosis. A right renal aortic ratio was 3.22 suggesting less than a 50% stenosis.  . TONSILLECTOMY    . TRANSTHORACIC ECHOCARDIOGRAM  01/29/2016; 11/2016   2017: EF 55-60%, normal LV  wall motion, grade I DD.  No cardiac source of emboli was seen.  2018: EF 35-40%, could not assess DD due to a-fib, pulm HTN noted.     Social History Social History  Substance Use Topics  . Smoking status: Former Smoker    Quit date: 03/17/1972  . Smokeless tobacco: Never Used  . Alcohol use 0.6 oz/week    1 Glasses of wine per week     Comment: wine    Family History Family History  Problem Relation Age of Onset  . Cancer Mother   . Heart disease Father   . Early death Father   . Sudden Cardiac Death Neg Hx     Allergies  Allergies  Allergen Reactions  . Clonidine Derivatives Palpitations and Other (See Comments)    Very sedated  . Codeine Nausea Only  . Nickel Rash  . Sulfa Antibiotics Other (See Comments)    Reaction unknown     Current Outpatient Prescriptions  Medication Sig Dispense Refill  . acetaminophen (TYLENOL) 500 MG tablet Take 500 mg by mouth every 6 (six) hours as needed for moderate pain.    [name] amiodarone (PACERONE) 200 MG tablet Take 1 tablet (200 mg total) by mouth daily. 30 tablet 0  . Calcium Carb-Cholecalciferol (CALCIUM/VITAMIN D PO) Take 1 tablet by mouth 2 (two) times daily.    . carvedilol (COREG) 25 MG tablet Take 1 tablet (25 mg total) by mouth 2 (two) times daily with a meal. 60 tablet 6  . folic acid (FOLVITE) 1 MG tablet Take 1 tablet (1 mg total) by mouth daily. 30 tablet 0  . isosorbide mononitrate (IMDUR) 60 MG 24 hr tablet Take 1 tablet (60 mg total) by mouth daily. 30 tablet 6  . Multiple Vitamin (MULTIVITAMIN) tablet Take 1 tablet by mouth 3 (three) times a week.     . ondansetron (ZOFRAN) 4 MG tablet Take 0.5-1 tablets (2-4 mg total) by mouth every 8 (eight) hours as needed for nausea or vomiting. 90 tablet 3  . oxyCODONE-acetaminophen (PERCOCET/ROXICET) 5-325 MG tablet Take 1-2 tablets by mouth every 6 (six) hours as needed for severe pain. 60 tablet 0  . polyethylene glycol (MIRALAX / GLYCOLAX) packet Take 17 g by mouth daily as  needed. 10 each 0  . pravastatin (PRAVACHOL) 40 MG tablet Take 40 mg by mouth every evening.    . sevelamer carbonate (RENVELA) 800 MG tablet Take 800 mg by mouth 2 (two) times daily with a meal.    . vitamin C (ASCORBIC ACID) 500 MG tablet Take 500 mg by mouth daily.    . Vitamin D, Ergocalciferol, (DRISDOL) 50000 units CAPS capsule Take 50,000 Units by mouth every 7 (seven) days.    [name] warfarin (COUMADIN) 3 MG tablet Take 1/2 to 1 tablet by mouth daily as directed by coumadin clinic (Patient taking differently: Take 1.5-3  mg by mouth See admin instructions. Take 1/2 tablet on Monday, Wednesday and Friday then take 1 tablet all the other days) 30 tablet 1   No current facility-administered medications for this visit.     ROS:   General:  No weight loss, Fever, chills  HEENT: No recent headaches, no nasal bleeding, no visual changes, no sore throat  Neurologic: No dizziness, blackouts, seizures. No recent symptoms of stroke or mini- stroke. No recent episodes of slurred speech, or temporary blindness.  Cardiac: No recent episodes of chest pain/pressure, no shortness of breath at rest.  positive shortness of breath with exertion.  positive history of atrial fibrillation or irregular heartbeat  Vascular: No history of rest pain in feet.  No history of claudication.  No history of non-healing ulcer, No history of DVT   Pulmonary: No home oxygen, no productive cough, no hemoptysis,  No asthma or wheezing  Musculoskeletal:  [x ] Arthritis, [x ] Low back pain,  [ ]  Joint pain  Hematologic:No history of hypercoagulable state.  No history of easy bleeding.  No history of anemia  Gastrointestinal: No hematochezia or melena,  No gastroesophageal reflux, no trouble swallowing  Urinary: [ ]  chronic Kidney disease, [ ]  on HD - [ ]  MWF or [ ]  TTHS, [ ]  Burning with urination, [ ]  Frequent urination, [ ]  Difficulty urinating;   Skin: No rashes  Psychological: No history of anxiety,  No history of  depression   Physical Examination  Vitals:   03/17/17 1347  BP: 135/73  Pulse: 71  Resp: 18  SpO2: 95%  Weight: 90 lb (40.8 kg)  Height: 4\' 11"  (1.499 m)    Body mass index is 18.18 kg/m.  General:  Alert and oriented, no acute distress HEENT: Normal Neck: No bruit or JVD Pulmonary: Clear to auscultation bilaterally Cardiac: Regular Rate and Rhythm without murmur Gastrointestinal: Soft, non-tender, non-distended, no mass, no scars Skin: No rash Extremity Pulses:  2+ radial, brachial pulses bilaterally Musculoskeletal: No deformity or edema  Neurologic: Upper and lower extremity motor 5/5 and symmetric Psychiatric: Judgment intact, Mood & affect appropriate for pt's clinical situation Lymph : No Cervical, Axillary, or Inguinal lymphadenopathy   DATA:  Left basilic is acceptable for fistula creation vein mapping reviewed 03/17/2017 Left UE arterial flow is triphasic  ASSESSMENT:  ESRD on HD via left [facility]  PLAN: Left basilic single stage fistula creation by Dr. Bridgett Larsson Nov. 14/2018.  She will be instructed to hold her Coumadin for 5 days prior to the procedure.  Risk of surgery was discussed in the office today.  Theda Sers, EMMA MAUREEN PA-C [facility]  The patient was seen in conjunction with Dr. Bridgett Larsson today  Addendum  I have independently interviewed and examined the patient, and I agree with the physician assistant's findings.  Pt's B basilic veins are adequate for single stage BVT.  Would start with L single stage BVT.   Risk, benefits, and alternatives to access surgery were discussed.    The patient is aware the risks include but are not limited to: bleeding, infection, steal syndrome, nerve damage, ischemic monomelic neuropathy, thrombosis, failure to mature, complications related to venous hypertension, need for additional procedures, death and stroke.    The patient agrees to proceed forward with the procedure.  Will schedule her  on 14 NOV 18.   Adele Barthel, MD, FACS [facility] [place] Office: [phone] Pager: [phone]  03/17/2017, 3:57 PM

## 2017-03-18 ENCOUNTER — Telehealth: Payer: Self-pay | Admitting: *Deleted

## 2017-03-18 ENCOUNTER — Other Ambulatory Visit: Payer: Self-pay | Admitting: *Deleted

## 2017-03-18 DIAGNOSIS — D689 Coagulation defect, unspecified: Secondary | ICD-10-CM | POA: Diagnosis not present

## 2017-03-18 DIAGNOSIS — N186 End stage renal disease: Secondary | ICD-10-CM | POA: Diagnosis not present

## 2017-03-18 DIAGNOSIS — D631 Anemia in chronic kidney disease: Secondary | ICD-10-CM | POA: Diagnosis not present

## 2017-03-18 DIAGNOSIS — Z4901 Encounter for fitting and adjustment of extracorporeal dialysis catheter: Secondary | ICD-10-CM | POA: Diagnosis not present

## 2017-03-18 DIAGNOSIS — E876 Hypokalemia: Secondary | ICD-10-CM | POA: Diagnosis not present

## 2017-03-18 DIAGNOSIS — I4891 Unspecified atrial fibrillation: Secondary | ICD-10-CM | POA: Diagnosis not present

## 2017-03-18 LAB — PROTIME-INR: INR: 2.1 — AB (ref 0.9–1.1)

## 2017-03-18 NOTE — Telephone Encounter (Signed)
Call to patient's daughter [name] inform her to hold Coumadin x 3 days pre-op, and patient to expect a call from PAT @[facility].Verbalized understanding.

## 2017-03-19 ENCOUNTER — Other Ambulatory Visit: Payer: Self-pay | Admitting: *Deleted

## 2017-03-19 MED ORDER — FOLIC ACID 1 MG PO TABS: 1.0000 mg | ORAL_TABLET | Freq: Every day | ORAL | 3 refills | Status: DC

## 2017-03-19 NOTE — Telephone Encounter (Signed)
[facility]  RF request for folic acid LOV: 49/82/64 Next ov: None Last written: 02/14/17 #30 w/ 0RF  Please advise. Thanks.

## 2017-03-20 DIAGNOSIS — D631 Anemia in chronic kidney disease: Secondary | ICD-10-CM | POA: Diagnosis not present

## 2017-03-20 DIAGNOSIS — Z4901 Encounter for fitting and adjustment of extracorporeal dialysis catheter: Secondary | ICD-10-CM | POA: Diagnosis not present

## 2017-03-20 DIAGNOSIS — E876 Hypokalemia: Secondary | ICD-10-CM | POA: Diagnosis not present

## 2017-03-20 DIAGNOSIS — N186 End stage renal disease: Secondary | ICD-10-CM | POA: Diagnosis not present

## 2017-03-20 DIAGNOSIS — D689 Coagulation defect, unspecified: Secondary | ICD-10-CM | POA: Diagnosis not present

## 2017-03-20 DIAGNOSIS — I4891 Unspecified atrial fibrillation: Secondary | ICD-10-CM | POA: Diagnosis not present

## 2017-03-21 DIAGNOSIS — R5381 Other malaise: Secondary | ICD-10-CM | POA: Diagnosis not present

## 2017-03-22 DIAGNOSIS — N186 End stage renal disease: Secondary | ICD-10-CM | POA: Diagnosis not present

## 2017-03-22 DIAGNOSIS — I255 Ischemic cardiomyopathy: Secondary | ICD-10-CM | POA: Diagnosis not present

## 2017-03-22 DIAGNOSIS — Z950 Presence of cardiac pacemaker: Secondary | ICD-10-CM | POA: Diagnosis not present

## 2017-03-22 DIAGNOSIS — E785 Hyperlipidemia, unspecified: Secondary | ICD-10-CM | POA: Diagnosis not present

## 2017-03-22 DIAGNOSIS — J9621 Acute and chronic respiratory failure with hypoxia: Secondary | ICD-10-CM | POA: Diagnosis not present

## 2017-03-22 DIAGNOSIS — D638 Anemia in other chronic diseases classified elsewhere: Secondary | ICD-10-CM | POA: Diagnosis not present

## 2017-03-22 DIAGNOSIS — I480 Paroxysmal atrial fibrillation: Secondary | ICD-10-CM | POA: Diagnosis not present

## 2017-03-22 DIAGNOSIS — Z79891 Long term (current) use of opiate analgesic: Secondary | ICD-10-CM | POA: Diagnosis not present

## 2017-03-22 DIAGNOSIS — Z5181 Encounter for therapeutic drug level monitoring: Secondary | ICD-10-CM | POA: Diagnosis not present

## 2017-03-22 DIAGNOSIS — I5043 Acute on chronic combined systolic (congestive) and diastolic (congestive) heart failure: Secondary | ICD-10-CM | POA: Diagnosis not present

## 2017-03-22 DIAGNOSIS — I739 Peripheral vascular disease, unspecified: Secondary | ICD-10-CM | POA: Diagnosis not present

## 2017-03-22 DIAGNOSIS — I130 Hypertensive heart and chronic kidney disease with heart failure and stage 1 through stage 4 chronic kidney disease, or unspecified chronic kidney disease: Secondary | ICD-10-CM | POA: Diagnosis not present

## 2017-03-22 DIAGNOSIS — Z9981 Dependence on supplemental oxygen: Secondary | ICD-10-CM | POA: Diagnosis not present

## 2017-03-23 ENCOUNTER — Ambulatory Visit (INDEPENDENT_AMBULATORY_CARE_PROVIDER_SITE_OTHER): Payer: Medicare Other | Admitting: Pharmacist Clinician (PhC)/ Clinical Pharmacy Specialist

## 2017-03-23 DIAGNOSIS — I480 Paroxysmal atrial fibrillation: Secondary | ICD-10-CM

## 2017-03-23 DIAGNOSIS — E876 Hypokalemia: Secondary | ICD-10-CM | POA: Diagnosis not present

## 2017-03-23 DIAGNOSIS — N186 End stage renal disease: Secondary | ICD-10-CM | POA: Diagnosis not present

## 2017-03-23 DIAGNOSIS — Z4901 Encounter for fitting and adjustment of extracorporeal dialysis catheter: Secondary | ICD-10-CM | POA: Diagnosis not present

## 2017-03-23 DIAGNOSIS — Z7901 Long term (current) use of anticoagulants: Secondary | ICD-10-CM

## 2017-03-23 DIAGNOSIS — D631 Anemia in chronic kidney disease: Secondary | ICD-10-CM | POA: Diagnosis not present

## 2017-03-23 DIAGNOSIS — I4891 Unspecified atrial fibrillation: Secondary | ICD-10-CM | POA: Diagnosis not present

## 2017-03-23 DIAGNOSIS — D689 Coagulation defect, unspecified: Secondary | ICD-10-CM | POA: Diagnosis not present

## 2017-03-24 ENCOUNTER — Other Ambulatory Visit: Payer: Self-pay

## 2017-03-24 ENCOUNTER — Encounter: Payer: Self-pay | Admitting: Family Medicine

## 2017-03-24 ENCOUNTER — Ambulatory Visit (INDEPENDENT_AMBULATORY_CARE_PROVIDER_SITE_OTHER): Payer: Medicare Other | Admitting: Family Medicine

## 2017-03-24 VITALS — BP 152/64 | HR 61 | Temp 97.5°F | Resp 16 | Ht 59.0 in | Wt 108.2 lb

## 2017-03-24 DIAGNOSIS — M7918 Myalgia, other site: Secondary | ICD-10-CM

## 2017-03-24 DIAGNOSIS — M62838 Other muscle spasm: Secondary | ICD-10-CM | POA: Diagnosis not present

## 2017-03-24 DIAGNOSIS — M25552 Pain in left hip: Secondary | ICD-10-CM

## 2017-03-24 MED ORDER — PRAVASTATIN SODIUM 40 MG PO TABS
40.0000 mg | ORAL_TABLET | Freq: Every evening | ORAL | 1 refills | Status: DC
Start: 2017-03-24 — End: 2017-11-15

## 2017-03-24 MED ORDER — OXYCODONE-ACETAMINOPHEN 5-325 MG PO TABS: 1.0000 | ORAL_TABLET | Freq: Four times a day (QID) | ORAL | 0 refills | Status: DC | PRN

## 2017-03-24 MED ORDER — ISOSORBIDE MONONITRATE ER 60 MG PO TB24: 60.0000 mg | ORAL_TABLET | Freq: Every day | ORAL | 1 refills | Status: DC

## 2017-03-24 MED ORDER — CARVEDILOL 25 MG PO TABS: 25.0000 mg | ORAL_TABLET | Freq: Two times a day (BID) | ORAL | 1 refills | Status: DC

## 2017-03-24 NOTE — Progress Notes (Signed)
OFFICE VISIT  03/24/2017   CC:  Chief Complaint  Patient presents with  . Hip Pain    right hip has improved but left hip has started to hurt   HPI:    Patient is a 82 y.o. Caucasian female who presents accompanied by her daughter for hip pain. R hip had been hurting since steroid injection in mid October (this helped her back, by the way), but pt says now this is not hurting.  "It has migrated to left hip".  Now left hip started hurting about 3 d/a at ischial tuberosity region, also points to L SI region.  Mild pain sitting, then up to 9 with getting into standing position and then calms down to more moderate pain with standing or ambulating.  The pain does not radiate.  No preceding trauma.  The oxycodone helps it some--she just takes 1 oxycodone twice a day.  Also taking tylenol.  No paresthesias, no leg weakness.  No loss of bowel/bladder control.  No malaise or fevers.  CT pelvis w/out contrast 03/15/17: Impression: Mild degenerative changes but no fracture or AVN.  Past Medical History:  Diagnosis Date  . Anemia of chronic disease 2018   + anemia of CRI?  [name] Atrophy of left kidney    with absent blood flow by renal artery dopplers (Dr. Gwenlyn Found)  . 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 Chronic combined systolic and diastolic CHF (congestive heart failure) ([place])    a. 11/2016: echo showing EF of 35-40%, RV strain noted, mild MR and mild TR.   [name] Chronic hypoxemic respiratory failure ([place])   . Debilitated patient    WC dependent as of 2018  . ESRD on hemodialysis ([place]) 01/2017   T/Th/Sat schedule  . History of adenomatous polyp of colon   . History of subarachnoid hemorrhage 10/2014   after syncope and while on xarelto  . Hyperlipidemia   . Hypertension    Difficult to control, in the setting of one functioning kidney: pt was referred to nephrology by Dr. Gwenlyn Found 06/2015.  . Lumbar radiculopathy 2012  . Lumbar spondylosis    MR 07/2016---no sign of spinal nerve  compression or cord compression.  Pt set up with outpt ortho while admitted to hosp 07/2016.  . Malnourished ([place])   . Metatarsal fracture 06/10/2016   Nondisplaced, left 5th metatarsal--pt was referred to ortho  . Nonischemic cardiomyopathy ([place])   . Osteopenia 2014   T-score -2.1  . PAF (paroxysmal atrial fibrillation) ([facility])    Eliquis started after BRAO and CVA.  She was changed to warfarin 2018--INR/coumadin mgmt per HD/nephrol.  . Pulmonary edema 11/2016  . Right rib fracture 12/2016   s/p fall  . Stroke [facility])    cardioembolic (had CVA while on no anticoag)--"scattered subacute punctate infarcts: 1 in R parietal lobe and 2 in occipital cortex" on MRI br.  CT angio head/neck: aortic arch athero.   . Symptomatic bradycardia    Dual chamber pacer insertion 2018 (Dr. Lovena Le)  . Thoracic back pain 01/2017   Facet?  Dr. Nelva Bush to do steroid injection as of 02/2017  . Thoracic compression fracture ([place]) 07/2016   T7 and T8-- T8 kyphoplasty during hosp admission 07/2016.  Neuro referred pt to pain mgmt for consideration of injection 09/2016.  I referred her to endo 08/2016 for consideration of calcitonin treatment.    Past Surgical History:  Procedure Laterality Date  . APPENDECTOMY  child  . CARDIOVASCULAR STRESS TEST  01/31/2016  Stress myoview: NORMAL/Low risk.  EF 56%.  . [place]  . COLONOSCOPY  2015   + hx of adenomatous polyps.  Need digest health spec in [facility] records to see when pt due for next colonoscopy  . IR GENERIC HISTORICAL  07/24/2016   IR KYPHO THORACIC WITH BONE BIOPSY 07/24/2016 Luanne Bras, MD [facility]-INTERV RAD  . KYPHOPLASTY  07/27/2016   T8  . Renal artery dopplers  02/26/2016   Her right renal dimension was 11 cm pole to pole with mild to moderate right renal artery stenosis. A right renal aortic ratio was 3.22 suggesting less than a 50% stenosis.  . TONSILLECTOMY    . TRANSTHORACIC ECHOCARDIOGRAM  01/29/2016; 11/2016   2017: EF 55-60%, normal LV  wall motion, grade I DD.  No cardiac source of emboli was seen.  2018: EF 35-40%, could not assess DD due to a-fib, pulm HTN noted.    Outpatient Medications Prior to Visit  Medication Sig Dispense Refill  . acetaminophen (TYLENOL) 500 MG tablet Take 500 mg by mouth every 6 (six) hours as needed for moderate pain.    [name] amiodarone (PACERONE) 200 MG tablet Take 1 tablet (200 mg total) by mouth daily. 30 tablet 0  . Calcium Carb-Cholecalciferol (CALCIUM/VITAMIN D PO) Take 1 tablet by mouth 2 (two) times daily.    . folic acid (FOLVITE) 1 MG tablet Take 1 tablet (1 mg total) by mouth daily. 90 tablet 3  . Multiple Vitamin (MULTIVITAMIN) tablet Take 1 tablet by mouth 3 (three) times a week.     . ondansetron (ZOFRAN) 4 MG tablet Take 0.5-1 tablets (2-4 mg total) by mouth every 8 (eight) hours as needed for nausea or vomiting. 90 tablet 3  . polyethylene glycol (MIRALAX / GLYCOLAX) packet Take 17 g by mouth daily as needed. 10 each 0  . sevelamer carbonate (RENVELA) 800 MG tablet Take 800 mg by mouth 2 (two) times daily with a meal.    . vitamin C (ASCORBIC ACID) 500 MG tablet Take 500 mg by mouth daily.    . Vitamin D, Ergocalciferol, (DRISDOL) 50000 units CAPS capsule Take 50,000 Units by mouth every 7 (seven) days.    [name] warfarin (COUMADIN) 3 MG tablet Take 1/2 to 1 tablet by mouth daily as directed by coumadin clinic (Patient taking differently: Take 1.5-3 mg by mouth See admin instructions. Take 1/2 tablet on Monday, Wednesday and Friday then take 1 tablet all the other days) 30 tablet 1  . carvedilol (COREG) 25 MG tablet Take 1 tablet (25 mg total) by mouth 2 (two) times daily with a meal. 60 tablet 6  . isosorbide mononitrate (IMDUR) 60 MG 24 hr tablet Take 1 tablet (60 mg total) by mouth daily. 30 tablet 6  . oxyCODONE-acetaminophen (PERCOCET/ROXICET) 5-325 MG tablet Take 1-2 tablets by mouth every 6 (six) hours as needed for severe pain. 60 tablet 0  . pravastatin (PRAVACHOL) 40 MG tablet Take  40 mg by mouth every evening.     No facility-administered medications prior to visit.     Allergies  Allergen Reactions  . Clonidine Derivatives Palpitations and Other (See Comments)    Very sedated  . Codeine Nausea Only  . Nickel Rash  . Sulfa Antibiotics Other (See Comments)    Reaction unknown    ROS As per HPI  PE: Blood pressure (!) 152/64, pulse 61, temperature (!) 97.5 F (36.4 C), temperature source Oral, resp. rate 16, height 4\' 11"  (1.499 m), weight 108 lb  4 oz (49.1 kg), SpO2 93 %. Gen: Alert, well appearing.  Patient is oriented to person, place, time, and situation. AFFECT: pleasant, lucid thought and speech. Left hip w/out any tenderness anteriorly or laterally.  Mild TTP around the area of the ischial tuberosity, but also in upper medial region of L glut, essentially at lower 1/2 of the left SI joint. ROM of L hip is intact, and pain reproduction is equivocal.  When pt stood up I could feel spasm of L glut/hamstring.  LE strength 5/5 prox/dist bilat.  LABS:    Chemistry      Component Value Date/Time   NA 129 (L) 02/18/2017 0800   NA 137 09/01/2016   K 4.8 02/18/2017 0800   CL 92 (L) 02/18/2017 0800   CO2 22 02/18/2017 0800   BUN 38 (H) 02/18/2017 0800   BUN 51 (A) 09/01/2016   CREATININE 4.08 (H) 02/18/2017 0800   CREATININE 1.63 (H) 03/24/2016 0110   GLU 128 09/01/2016      Component Value Date/Time   CALCIUM 8.7 (L) 02/18/2017 0800   CALCIUM 9.5 02/16/2017 0232   ALKPHOS 89 02/13/2017 1357   AST 34 02/13/2017 1357   ALT 23 02/13/2017 1357   BILITOT 0.6 02/13/2017 1357   BILITOT 0.2 06/17/2016 0819     Lab Results  Component Value Date   WBC 9.9 02/18/2017   HGB 12.6 02/18/2017   HCT 40.6 02/18/2017   MCV 94.9 02/18/2017   PLT 171 02/18/2017    IMPRESSION AND PLAN:  1) Acute left hip pain, suspect a combo of glut/hamstring muscle spasm + SI joint arthralgia. Will check L hip, pelvis plain films. Discussed muscle relaxer trial but we  agreed that this med would be more risky than beneficial for her. I did do another rx for her percocet 5/325, 1-2 q6h prn, #90. She is in the process of trying to arrange a f/u appt with Dr. Nelva Bush but is not having much response from his office and she is frustrated with this.  2) Mid back pain, hx of thoracic compression fx + facet region pain: recent steroid injection by dr. Nelva Bush quite helpful/patient is very pleased.  An After Visit Summary was printed and given to the patient.  FOLLOW UP: Return if symptoms worsen or fail to improve.  Signed:  Crissie Sickles, MD           03/24/2017

## 2017-03-25 ENCOUNTER — Ambulatory Visit (INDEPENDENT_AMBULATORY_CARE_PROVIDER_SITE_OTHER): Payer: Medicare Other

## 2017-03-25 DIAGNOSIS — M62838 Other muscle spasm: Secondary | ICD-10-CM | POA: Diagnosis not present

## 2017-03-25 DIAGNOSIS — M7918 Myalgia, other site: Secondary | ICD-10-CM

## 2017-03-25 DIAGNOSIS — Z4901 Encounter for fitting and adjustment of extracorporeal dialysis catheter: Secondary | ICD-10-CM | POA: Diagnosis not present

## 2017-03-25 DIAGNOSIS — I4891 Unspecified atrial fibrillation: Secondary | ICD-10-CM | POA: Diagnosis not present

## 2017-03-25 DIAGNOSIS — D631 Anemia in chronic kidney disease: Secondary | ICD-10-CM | POA: Diagnosis not present

## 2017-03-25 DIAGNOSIS — D689 Coagulation defect, unspecified: Secondary | ICD-10-CM | POA: Diagnosis not present

## 2017-03-25 DIAGNOSIS — M25552 Pain in left hip: Secondary | ICD-10-CM

## 2017-03-25 DIAGNOSIS — E876 Hypokalemia: Secondary | ICD-10-CM | POA: Diagnosis not present

## 2017-03-25 DIAGNOSIS — N186 End stage renal disease: Secondary | ICD-10-CM | POA: Diagnosis not present

## 2017-03-26 DIAGNOSIS — E785 Hyperlipidemia, unspecified: Secondary | ICD-10-CM | POA: Diagnosis not present

## 2017-03-26 DIAGNOSIS — Z9981 Dependence on supplemental oxygen: Secondary | ICD-10-CM | POA: Diagnosis not present

## 2017-03-26 DIAGNOSIS — I5043 Acute on chronic combined systolic (congestive) and diastolic (congestive) heart failure: Secondary | ICD-10-CM | POA: Diagnosis not present

## 2017-03-26 DIAGNOSIS — I480 Paroxysmal atrial fibrillation: Secondary | ICD-10-CM | POA: Diagnosis not present

## 2017-03-26 DIAGNOSIS — N186 End stage renal disease: Secondary | ICD-10-CM | POA: Diagnosis not present

## 2017-03-26 DIAGNOSIS — I130 Hypertensive heart and chronic kidney disease with heart failure and stage 1 through stage 4 chronic kidney disease, or unspecified chronic kidney disease: Secondary | ICD-10-CM | POA: Diagnosis not present

## 2017-03-26 DIAGNOSIS — Z950 Presence of cardiac pacemaker: Secondary | ICD-10-CM | POA: Diagnosis not present

## 2017-03-26 DIAGNOSIS — J9621 Acute and chronic respiratory failure with hypoxia: Secondary | ICD-10-CM | POA: Diagnosis not present

## 2017-03-26 DIAGNOSIS — I255 Ischemic cardiomyopathy: Secondary | ICD-10-CM | POA: Diagnosis not present

## 2017-03-26 DIAGNOSIS — D638 Anemia in other chronic diseases classified elsewhere: Secondary | ICD-10-CM | POA: Diagnosis not present

## 2017-03-26 DIAGNOSIS — Z79891 Long term (current) use of opiate analgesic: Secondary | ICD-10-CM | POA: Diagnosis not present

## 2017-03-26 DIAGNOSIS — I739 Peripheral vascular disease, unspecified: Secondary | ICD-10-CM | POA: Diagnosis not present

## 2017-03-26 DIAGNOSIS — Z5181 Encounter for therapeutic drug level monitoring: Secondary | ICD-10-CM | POA: Diagnosis not present

## 2017-03-27 DIAGNOSIS — N186 End stage renal disease: Secondary | ICD-10-CM | POA: Diagnosis not present

## 2017-03-27 DIAGNOSIS — Z4901 Encounter for fitting and adjustment of extracorporeal dialysis catheter: Secondary | ICD-10-CM | POA: Diagnosis not present

## 2017-03-27 DIAGNOSIS — D689 Coagulation defect, unspecified: Secondary | ICD-10-CM | POA: Diagnosis not present

## 2017-03-27 DIAGNOSIS — I4891 Unspecified atrial fibrillation: Secondary | ICD-10-CM | POA: Diagnosis not present

## 2017-03-27 DIAGNOSIS — D631 Anemia in chronic kidney disease: Secondary | ICD-10-CM | POA: Diagnosis not present

## 2017-03-27 DIAGNOSIS — E876 Hypokalemia: Secondary | ICD-10-CM | POA: Diagnosis not present

## 2017-03-29 ENCOUNTER — Encounter (HOSPITAL_COMMUNITY): Payer: Self-pay | Admitting: *Deleted

## 2017-03-29 ENCOUNTER — Other Ambulatory Visit: Payer: Self-pay

## 2017-03-29 DIAGNOSIS — Z5181 Encounter for therapeutic drug level monitoring: Secondary | ICD-10-CM | POA: Diagnosis not present

## 2017-03-29 DIAGNOSIS — J9621 Acute and chronic respiratory failure with hypoxia: Secondary | ICD-10-CM | POA: Diagnosis not present

## 2017-03-29 DIAGNOSIS — I130 Hypertensive heart and chronic kidney disease with heart failure and stage 1 through stage 4 chronic kidney disease, or unspecified chronic kidney disease: Secondary | ICD-10-CM | POA: Diagnosis not present

## 2017-03-29 DIAGNOSIS — D638 Anemia in other chronic diseases classified elsewhere: Secondary | ICD-10-CM | POA: Diagnosis not present

## 2017-03-29 DIAGNOSIS — I5043 Acute on chronic combined systolic (congestive) and diastolic (congestive) heart failure: Secondary | ICD-10-CM | POA: Diagnosis not present

## 2017-03-29 DIAGNOSIS — Z950 Presence of cardiac pacemaker: Secondary | ICD-10-CM | POA: Diagnosis not present

## 2017-03-29 DIAGNOSIS — E785 Hyperlipidemia, unspecified: Secondary | ICD-10-CM | POA: Diagnosis not present

## 2017-03-29 DIAGNOSIS — I480 Paroxysmal atrial fibrillation: Secondary | ICD-10-CM | POA: Diagnosis not present

## 2017-03-29 DIAGNOSIS — Z79891 Long term (current) use of opiate analgesic: Secondary | ICD-10-CM | POA: Diagnosis not present

## 2017-03-29 DIAGNOSIS — I739 Peripheral vascular disease, unspecified: Secondary | ICD-10-CM | POA: Diagnosis not present

## 2017-03-29 DIAGNOSIS — I255 Ischemic cardiomyopathy: Secondary | ICD-10-CM | POA: Diagnosis not present

## 2017-03-29 DIAGNOSIS — N186 End stage renal disease: Secondary | ICD-10-CM | POA: Diagnosis not present

## 2017-03-29 DIAGNOSIS — Z9981 Dependence on supplemental oxygen: Secondary | ICD-10-CM | POA: Diagnosis not present

## 2017-03-30 ENCOUNTER — Telehealth: Payer: Self-pay | Admitting: *Deleted

## 2017-03-30 DIAGNOSIS — N186 End stage renal disease: Secondary | ICD-10-CM | POA: Diagnosis not present

## 2017-03-30 DIAGNOSIS — D689 Coagulation defect, unspecified: Secondary | ICD-10-CM | POA: Diagnosis not present

## 2017-03-30 DIAGNOSIS — D631 Anemia in chronic kidney disease: Secondary | ICD-10-CM | POA: Diagnosis not present

## 2017-03-30 DIAGNOSIS — I4891 Unspecified atrial fibrillation: Secondary | ICD-10-CM | POA: Diagnosis not present

## 2017-03-30 DIAGNOSIS — Z4901 Encounter for fitting and adjustment of extracorporeal dialysis catheter: Secondary | ICD-10-CM | POA: Diagnosis not present

## 2017-03-30 DIAGNOSIS — E876 Hypokalemia: Secondary | ICD-10-CM | POA: Diagnosis not present

## 2017-03-30 NOTE — Telephone Encounter (Signed)
Morey Hummingbird advised and voiced understanding.

## 2017-03-30 NOTE — Anesthesia Preprocedure Evaluation (Addendum)
Anesthesia Evaluation  Patient identified by MRN, date of birth, ID band Patient awake    Reviewed: Allergy & Precautions, NPO status , Patient's Chart, lab work & pertinent test results, reviewed documented beta blocker date and time   History of Anesthesia Complications (+) Family history of anesthesia reaction  Airway Mallampati: III  TM Distance: >3 FB Neck ROM: Full    Dental no notable dental hx. (+) Teeth Intact, Dental Advisory Given   Pulmonary former smoker,    Pulmonary exam normal breath sounds clear to auscultation       Cardiovascular hypertension, Pt. on medications and Pt. on home beta blockers + Peripheral Vascular Disease and +CHF  Normal cardiovascular exam+ dysrhythmias Atrial Fibrillation + pacemaker  Rhythm:Regular Rate:Normal     Neuro/Psych Retinal vein occlusion OS  Neuromuscular disease CVA, Residual Symptoms negative psych ROS   GI/Hepatic negative GI ROS, Neg liver ROS,   Endo/Other  Malnutrition Hyperlipidemia  Renal/GU Dialysis and ESRFRenal diseaseRenal artery stenosis  negative genitourinary   Musculoskeletal  (+) Arthritis , Osteoarthritis,  Compression Fx T7 & T8   Abdominal   Peds  Hematology  (+) anemia ,   Anesthesia Other Findings   Reproductive/Obstetrics                           Anesthesia Physical Anesthesia Plan  ASA: IV  Anesthesia Plan: MAC   Post-op Pain Management:    Induction: Intravenous  PONV Risk Score and Plan: 2 and Propofol infusion, Ondansetron and Treatment may vary due to age or medical condition  Airway Management Planned: Natural Airway and Simple Face Mask  Additional Equipment:   Intra-op Plan:   Post-operative Plan: Extubation in OR  Informed Consent: I have reviewed the patients History and Physical, chart, labs and discussed the procedure including the risks, benefits and alternatives for the proposed anesthesia  with the patient or authorized representative who has indicated his/her understanding and acceptance.   Dental advisory given  Plan Discussed with: CRNA, Anesthesiologist and Surgeon  Anesthesia Plan Comments:        Anesthesia Quick Evaluation

## 2017-03-30 NOTE — Telephone Encounter (Signed)
The RN should definitely contact the dialysis clinic and make sure they are aware.-thx

## 2017-03-30 NOTE — Telephone Encounter (Signed)
SW Leshara and she stated that she was just really concerned about pts weight loss. She stated that she is not sure who she should contact. Please advise. Thanks.

## 2017-03-30 NOTE — Telephone Encounter (Signed)
Carrie RN with [facility] on 03/30/17 at 10:30am stating that she stopped by to see pt and pt told her that she was not feeling well. That she had started to feel bad after dialysis. Morey Hummingbird also stated that pt in down 10lb since last week. She stated that she was not sure if this is something the dialysis clinic should handle or if we need too. She did request a call back to give more information.   Left message for Morey Hummingbird to call back.

## 2017-03-31 ENCOUNTER — Ambulatory Visit: Payer: Self-pay | Admitting: Family Medicine

## 2017-03-31 ENCOUNTER — Ambulatory Visit (HOSPITAL_COMMUNITY)
Admission: RE | Admit: 2017-03-31 | Discharge: 2017-03-31 | Disposition: A | Discharge status: Home / Self Care | Payer: Medicare Other | Source: Ambulatory Visit | Attending: Vascular Surgery | Admitting: Vascular Surgery

## 2017-03-31 ENCOUNTER — Ambulatory Visit (HOSPITAL_COMMUNITY): Payer: Medicare Other | Admitting: Anesthesiology

## 2017-03-31 ENCOUNTER — Encounter (HOSPITAL_COMMUNITY)
Admission: RE | Disposition: A | Discharge status: Home / Self Care | Payer: Self-pay | Source: Ambulatory Visit | Attending: Vascular Surgery

## 2017-03-31 ENCOUNTER — Encounter (HOSPITAL_COMMUNITY): Payer: Self-pay | Admitting: *Deleted

## 2017-03-31 DIAGNOSIS — Z992 Dependence on renal dialysis: Secondary | ICD-10-CM | POA: Diagnosis not present

## 2017-03-31 DIAGNOSIS — E785 Hyperlipidemia, unspecified: Secondary | ICD-10-CM | POA: Insufficient documentation

## 2017-03-31 DIAGNOSIS — Z882 Allergy status to sulfonamides status: Secondary | ICD-10-CM | POA: Insufficient documentation

## 2017-03-31 DIAGNOSIS — I5043 Acute on chronic combined systolic (congestive) and diastolic (congestive) heart failure: Secondary | ICD-10-CM | POA: Diagnosis not present

## 2017-03-31 DIAGNOSIS — N185 Chronic kidney disease, stage 5: Secondary | ICD-10-CM | POA: Diagnosis not present

## 2017-03-31 DIAGNOSIS — I480 Paroxysmal atrial fibrillation: Secondary | ICD-10-CM | POA: Insufficient documentation

## 2017-03-31 DIAGNOSIS — Z87891 Personal history of nicotine dependence: Secondary | ICD-10-CM | POA: Diagnosis not present

## 2017-03-31 DIAGNOSIS — N186 End stage renal disease: Secondary | ICD-10-CM | POA: Diagnosis not present

## 2017-03-31 DIAGNOSIS — Z8673 Personal history of transient ischemic attack (TIA), and cerebral infarction without residual deficits: Secondary | ICD-10-CM | POA: Insufficient documentation

## 2017-03-31 DIAGNOSIS — I132 Hypertensive heart and chronic kidney disease with heart failure and with stage 5 chronic kidney disease, or end stage renal disease: Secondary | ICD-10-CM | POA: Insufficient documentation

## 2017-03-31 DIAGNOSIS — M8580 Other specified disorders of bone density and structure, unspecified site: Secondary | ICD-10-CM | POA: Diagnosis not present

## 2017-03-31 DIAGNOSIS — I428 Other cardiomyopathies: Secondary | ICD-10-CM | POA: Insufficient documentation

## 2017-03-31 DIAGNOSIS — D631 Anemia in chronic kidney disease: Secondary | ICD-10-CM | POA: Diagnosis not present

## 2017-03-31 DIAGNOSIS — M5416 Radiculopathy, lumbar region: Secondary | ICD-10-CM | POA: Diagnosis not present

## 2017-03-31 DIAGNOSIS — Z950 Presence of cardiac pacemaker: Secondary | ICD-10-CM | POA: Diagnosis not present

## 2017-03-31 DIAGNOSIS — J9611 Chronic respiratory failure with hypoxia: Secondary | ICD-10-CM | POA: Insufficient documentation

## 2017-03-31 DIAGNOSIS — N184 Chronic kidney disease, stage 4 (severe): Secondary | ICD-10-CM

## 2017-03-31 DIAGNOSIS — I5042 Chronic combined systolic (congestive) and diastolic (congestive) heart failure: Secondary | ICD-10-CM | POA: Diagnosis not present

## 2017-03-31 DIAGNOSIS — M1990 Unspecified osteoarthritis, unspecified site: Secondary | ICD-10-CM | POA: Insufficient documentation

## 2017-03-31 DIAGNOSIS — Z7901 Long term (current) use of anticoagulants: Secondary | ICD-10-CM | POA: Insufficient documentation

## 2017-03-31 HISTORY — DX: Presence of cardiac pacemaker: Z95.0

## 2017-03-31 HISTORY — PX: AV FISTULA PLACEMENT: SHX1204

## 2017-03-31 HISTORY — DX: Personal history of urinary calculi: Z87.442

## 2017-03-31 HISTORY — DX: Unspecified hearing loss, unspecified ear: H91.90

## 2017-03-31 LAB — PROTIME-INR
INR: 2.42
PROTHROMBIN TIME: 26.1 s — AB (ref 11.4–15.2)

## 2017-03-31 LAB — POCT I-STAT 4, (NA,K, GLUC, HGB,HCT)
GLUCOSE: 114 mg/dL — AB (ref 65–99)
HCT: 42 % (ref 36.0–46.0)
Hemoglobin: 14.3 g/dL (ref 12.0–15.0)
Potassium: 4 mmol/L (ref 3.5–5.1)
Sodium: 135 mmol/L (ref 135–145)

## 2017-03-31 SURGERY — ARTERIOVENOUS (AV) FISTULA CREATION
Anesthesia: Monitor Anesthesia Care | Site: Arm Upper | Laterality: Right

## 2017-03-31 MED ORDER — FENTANYL CITRATE (PF) 100 MCG/2ML IJ SOLN: 25.0000 ug | INTRAMUSCULAR | Status: DC | PRN

## 2017-03-31 MED ORDER — ONDANSETRON HCL 4 MG/2ML IJ SOLN
INTRAMUSCULAR | Status: DC | PRN
  Administered 2017-03-31: 4 mg via INTRAVENOUS

## 2017-03-31 MED ORDER — SODIUM CHLORIDE 0.9 % IV SOLN
INTRAVENOUS | Status: DC | PRN
  Administered 2017-03-31: 500 mL

## 2017-03-31 MED ORDER — PROPOFOL 10 MG/ML IV BOLUS
INTRAVENOUS | Status: AC
  Filled 2017-03-31: qty 20

## 2017-03-31 MED ORDER — METOCLOPRAMIDE HCL 5 MG/ML IJ SOLN: 10.0000 mg | Freq: Once | INTRAMUSCULAR | Status: DC | PRN

## 2017-03-31 MED ORDER — FENTANYL CITRATE (PF) 250 MCG/5ML IJ SOLN
INTRAMUSCULAR | Status: AC
  Filled 2017-03-31: qty 5

## 2017-03-31 MED ORDER — HEMOSTATIC AGENTS (NO CHARGE) OPTIME
TOPICAL | Status: DC | PRN
  Administered 2017-03-31: 1 via TOPICAL

## 2017-03-31 MED ORDER — PROPOFOL 500 MG/50ML IV EMUL
INTRAVENOUS | Status: DC | PRN
  Administered 2017-03-31: 50 ug/kg/min via INTRAVENOUS

## 2017-03-31 MED ORDER — MEPERIDINE HCL 25 MG/ML IJ SOLN: 6.2500 mg | INTRAMUSCULAR | Status: DC | PRN

## 2017-03-31 MED ORDER — OXYCODONE-ACETAMINOPHEN 5-325 MG PO TABS: 1.0000 | ORAL_TABLET | Freq: Four times a day (QID) | ORAL | 0 refills | Status: DC | PRN

## 2017-03-31 MED ORDER — DEXTROSE 5 % IV SOLN
1.5000 g | INTRAVENOUS | Status: AC
  Administered 2017-03-31: 1.5 g via INTRAVENOUS
  Filled 2017-03-31: qty 1.5

## 2017-03-31 MED ORDER — CARVEDILOL 12.5 MG PO TABS
ORAL_TABLET | ORAL | Status: AC
  Administered 2017-03-31: 25 mg via ORAL
  Filled 2017-03-31: qty 2

## 2017-03-31 MED ORDER — CARVEDILOL 25 MG PO TABS
25.0000 mg | ORAL_TABLET | Freq: Two times a day (BID) | ORAL | Status: DC
  Administered 2017-03-31: 25 mg via ORAL
  Filled 2017-03-31: qty 1

## 2017-03-31 MED ORDER — LIDOCAINE HCL 1 % IJ SOLN
INTRAMUSCULAR | Status: AC
  Filled 2017-03-31: qty 20

## 2017-03-31 MED ORDER — 0.9 % SODIUM CHLORIDE (POUR BTL) OPTIME
TOPICAL | Status: DC | PRN
  Administered 2017-03-31: 1000 mL

## 2017-03-31 MED ORDER — WARFARIN SODIUM 3 MG PO TABS: 1.5000 mg | ORAL_TABLET | ORAL | Status: DC

## 2017-03-31 MED ORDER — SODIUM CHLORIDE 0.9 % IV SOLN
INTRAVENOUS | Status: DC
  Administered 2017-03-31: 08:00:00 via INTRAVENOUS

## 2017-03-31 MED ORDER — PHENYLEPHRINE HCL 10 MG/ML IJ SOLN
INTRAVENOUS | Status: DC | PRN
  Administered 2017-03-31: 20 ug/min via INTRAVENOUS

## 2017-03-31 MED ORDER — LIDOCAINE HCL (PF) 1 % IJ SOLN
INTRAMUSCULAR | Status: DC | PRN
  Administered 2017-03-31: 16 mL
  Administered 2017-03-31: 20 mL

## 2017-03-31 SURGICAL SUPPLY — 41 items
ADH SKN CLS APL DERMABOND .7 (GAUZE/BANDAGES/DRESSINGS) ×1
AGENT HMST SPONGE THK3/8 (HEMOSTASIS) ×1
ARMBAND PINK RESTRICT EXTREMIT (MISCELLANEOUS) ×6 IMPLANT
BANDAGE ACE 4X5 VEL STRL LF (GAUZE/BANDAGES/DRESSINGS) ×2 IMPLANT
CANISTER SUCT 3000ML PPV (MISCELLANEOUS) ×3 IMPLANT
CANNULA VESSEL 3MM 2 BLNT TIP (CANNULA) ×2 IMPLANT
CLIP VESOCCLUDE MED 6/CT (CLIP) ×3 IMPLANT
CLIP VESOCCLUDE SM WIDE 6/CT (CLIP) ×5 IMPLANT
COVER PROBE W GEL 5X96 (DRAPES) ×5 IMPLANT
DECANTER SPIKE VIAL GLASS SM (MISCELLANEOUS) ×5 IMPLANT
DERMABOND ADVANCED (GAUZE/BANDAGES/DRESSINGS) ×2
DERMABOND ADVANCED .7 DNX12 (GAUZE/BANDAGES/DRESSINGS) ×1 IMPLANT
ELECT CAUTERY BLADE 6.4 (BLADE) ×2 IMPLANT
ELECT REM PT RETURN 9FT ADLT (ELECTROSURGICAL) ×3
ELECTRODE REM PT RTRN 9FT ADLT (ELECTROSURGICAL) ×1 IMPLANT
GAUZE SPONGE 4X4 12PLY STRL LF (GAUZE/BANDAGES/DRESSINGS) ×2 IMPLANT
GLOVE BIO SURGEON STRL SZ 6.5 (GLOVE) ×2 IMPLANT
GLOVE BIO SURGEON STRL SZ7 (GLOVE) ×3 IMPLANT
GLOVE BIO SURGEONS STRL SZ 6.5 (GLOVE) ×2
GLOVE BIOGEL PI IND STRL 6.5 (GLOVE) IMPLANT
GLOVE BIOGEL PI IND STRL 7.5 (GLOVE) ×1 IMPLANT
GLOVE BIOGEL PI INDICATOR 6.5 (GLOVE) ×4
GLOVE BIOGEL PI INDICATOR 7.5 (GLOVE) ×2
GLOVE ECLIPSE 7.5 STRL STRAW (GLOVE) ×2 IMPLANT
GOWN STRL REUS W/ TWL LRG LVL3 (GOWN DISPOSABLE) ×3 IMPLANT
GOWN STRL REUS W/TWL LRG LVL3 (GOWN DISPOSABLE) ×9
HEMOSTAT SPONGE AVITENE ULTRA (HEMOSTASIS) ×2 IMPLANT
KIT BASIN OR (CUSTOM PROCEDURE TRAY) ×3 IMPLANT
KIT ROOM TURNOVER OR (KITS) ×3 IMPLANT
NS IRRIG 1000ML POUR BTL (IV SOLUTION) ×3 IMPLANT
PACK CV ACCESS (CUSTOM PROCEDURE TRAY) ×3 IMPLANT
PAD ARMBOARD 7.5X6 YLW CONV (MISCELLANEOUS) ×6 IMPLANT
STAPLER VISISTAT 35W (STAPLE) ×2 IMPLANT
SUT MNCRL AB 4-0 PS2 18 (SUTURE) ×3 IMPLANT
SUT PROLENE 6 0 BV (SUTURE) ×4 IMPLANT
SUT PROLENE 7 0 BV 1 (SUTURE) ×3 IMPLANT
SUT SILK 2 0 SH (SUTURE) ×2 IMPLANT
SUT VIC AB 3-0 SH 27 (SUTURE) ×9
SUT VIC AB 3-0 SH 27X BRD (SUTURE) ×1 IMPLANT
UNDERPAD 30X30 (UNDERPADS AND DIAPERS) ×3 IMPLANT
WATER STERILE IRR 1000ML POUR (IV SOLUTION) ×3 IMPLANT

## 2017-03-31 NOTE — Interval H&P Note (Signed)
   History and Physical Update  The patient was interviewed and re-examined.  The patient's previous History and Physical has been reviewed and is unchanged from my consult.  There is no change in the plan of care: Left single stage basilic vein transposition.   Risk, benefits, and alternatives to access surgery were discussed.    The patient is aware the risks include but are not limited to: bleeding, infection, steal syndrome, nerve damage, ischemic monomelic neuropathy, thrombosis, failure to mature, complications related to venous hypertension, need for additional procedures, death and stroke.    The patient agrees to proceed forward with the procedure.   Adele Barthel, MD, FACS [facility] [place] Office: [phone] Pager: [phone]  03/31/2017, 7:35 AM

## 2017-03-31 NOTE — Transfer of Care (Signed)
Immediate Anesthesia Transfer of Care Note  Patient: Valerie Terrell  Procedure(s) Performed: Right ARM Basilic vein transposition (Right Arm Upper)  Patient Location: PACU  Anesthesia Type:MAC  Level of Consciousness: awake, alert , oriented and patient cooperative  Airway & Oxygen Therapy: Patient Spontanous Breathing and Patient connected to nasal cannula oxygen  Post-op Assessment: Report given to RN, Post -op Vital signs reviewed and stable and Patient moving all extremities X 4  Post vital signs: Reviewed and stable  Last Vitals:  Vitals:   03/31/17 0640  BP: 140/67  Pulse: 65  Resp: 16  Temp: 36.6 C  SpO2: 95%    Last Pain:  Vitals:   03/31/17 0640  TempSrc: Oral      Patients Stated Pain Goal: 3 (50/15/86 8257)  Complications: No apparent anesthesia complications

## 2017-03-31 NOTE — Op Note (Signed)
OPERATIVE NOTE   PROCEDURE: 1. right single stage basilic vein transposition (brachiobasilic arteriovenous fistula) placement  PRE-OPERATIVE DIAGNOSIS: end stage renal disease   POST-OPERATIVE DIAGNOSIS: same as above   SURGEON: Adele Barthel, MD  ASSISTANT(S): Leontine Locket, PAC   ANESTHESIA: local and MAC  ESTIMATED BLOOD LOSS: 50 cc  FINDING(S): 1.  Basilic vein: 4-6 mm after distension, tapers distally, acceptable quality 2.  Brachial artery: 3-4 mm, limited disease 3.  Venous outflow: palpable thrill  4.  Radial flow: dopplerable radial signal  SPECIMEN(S):  none  INDICATIONS:   Valerie Terrell is a 83 y.o. female who presents with end stage renal disease.  The patient is scheduled for left single stage basilic vein transposition.  During the process, I noticed left chest vein and a permanent pacemaker, so the procedure was changed to a right single stage basilic vein transposition.  The patient is aware the risks include but are not limited to: bleeding, infection, steal syndrome, nerve damage, ischemic monomelic neuropathy, failure to mature, and need for additional procedures.  The patient is aware of the risks of the procedure and elects to proceed forward.   DESCRIPTION: After full informed written consent was obtained from the patient, the patient was brought back to the operating room and placed supine upon the operating table.  Prior to induction, the patient received IV antibiotics.   After obtaining adequate anesthesia, the patient was then prepped and draped in the standard fashion for a right arm access procedure.  I turned my attention first to identifying the patient's basilic vein and brachial artery.    Using SonoSite guidance, the location of these vessels were marked out on the skin.   At this point, I injected local anesthetic to obtain a field block of the antecubitum.  In total, I injected about 40 mL of 1% lidocaine without epinephrine to obtain  anesthesia of the three vein harvest incisions and subcutaneous tunnel.  I identified the basilic vein and a large cubital branch at the antecubitum.  I marked the path of this vein and injection lidocaine along its entire route.  I made a total of three skip incisions over the basilic vein and cubital vein.  I dissected out the vein from the axillary down to the cubital branch.  The forearm basilic vein segment appeared to be smaller than the cubital vein.  I tied off all side branches with silk ties and titanium clips.  I mobilized the vein from the vein harvest incisions and then inserted a vessel cannula distally and distended the vein to mark the orientation of the vein.    I then dissected out the brachial artery in the distal incision and placed vessel loops proximally and distally.  I distended the vein and determined the route for this fistula.  I injected 1% lidocaine without epinephrine along this route and tunneled subcutaneously from the distal incision to the axillary incision with the metal tunneler.  I tied the vein to the inner cannula.  The vein was delivered through the metal tunnel, taking care to maintain orientation.  I removed the metal tunnel, leaving the vein in place.  I replaced the vessel cannula and reinflated the vein.  There appeared to be no kinks or twists.  At this point, I reset my exposure of the brachial artery and placed the artery under tension proximally and distally.  I made an arteriotomy with a #11 blade, and then I extended the arteriotomy with a Potts scissor.  I  injected heparinized saline proximal and distal to this arteriotomy.  The vein was then sewn to the artery in an end-to-side configuration with a running stitch of 6-0 Prolene.  Prior to completing this anastomosis, I allowed the vein and artery to backbleed.  There was no evidence of clot from any vessels.  I completed the anastomosis in the usual fashion and then released all vessel loops and clamps.     There was a palpable thrill in the venous outflow, and there was a dopplerable radial signal.  At this point, I irrigated out the surgical wound.  There was no further active bleeding.  The subcutaneous tissue in each vein harvest incision was reapproximated with a running stitch of 3-0 Vicryl.  The skin was then reapproximated with staples due to the redundancy of her skin.  The skin was then cleaned, dried, and dressing with sterile bandages.  These were held in place with a gentle ACE wrap.  The patient tolerated this procedure well.    COMPLICATIONS: none  CONDITION: stable   Adele Barthel, MD, [facility] [place] Office: [phone] Pager: [phone]  03/31/2017, 10:53 AM

## 2017-03-31 NOTE — Discharge Instructions (Signed)
° °  [facility]  Discharge Instructions  AV Fistula or Graft Surgery for Dialysis Access  Please refer to the following instructions for your post-procedure care. Your surgeon or physician assistant will discuss any changes with you.  Activity  You may drive the day following your surgery, if you are comfortable and no longer taking prescription pain medication. Resume full activity as the soreness in your incision resolves.  Bathing/Showering  You may shower after you go home. Keep your incision dry for 48 hours. Do not soak in a bathtub, hot tub, or swim until the incision heals completely. You may not shower if you have a hemodialysis catheter.  Incision Care  Clean your incision with mild soap and water after 48 hours. Pat the area dry with a clean towel. You do not need a bandage unless otherwise instructed. Do not apply any ointments or creams to your incision. You may have skin glue on your incision. Do not peel it off. It will come off on its own in about one week. Your arm may swell a bit after surgery. To reduce swelling use pillows to elevate your arm so it is above your heart. Your doctor will tell you if you need to lightly wrap your arm with an ACE bandage.  Diet  Resume your normal diet. There are not special food restrictions following this procedure. In order to heal from your surgery, it is CRITICAL to get adequate nutrition. Your body requires vitamins, minerals, and protein. Vegetables are the best source of vitamins and minerals. Vegetables also provide the perfect balance of protein. Processed food has little nutritional value, so try to avoid this.  Medications  Resume taking all of your medications. If your incision is causing pain, you may take over-the counter pain relievers such as acetaminophen (Tylenol). If you were prescribed a stronger pain medication, please be aware these medications can cause nausea and constipation. Prevent  nausea by taking the medication with a snack or meal. Avoid constipation by drinking plenty of fluids and eating foods with high amount of fiber, such as fruits, vegetables, and grains. Do not take Tylenol if you are taking prescription pain medications.  Follow up Your surgeon may want to see you in the office following your access surgery. If so, this will be arranged at the time of your surgery.  Please call us immediately for any of the following conditions:  Increased pain, redness, drainage (pus) from your incision site Fever of 101 degrees or higher Severe or worsening pain at your incision site Hand pain or numbness.  Reduce your risk of vascular disease:  Stop smoking. If you would like help, call [facility] at 1-800-QUIT-NOW 8030273470) or [place] at [place] your cholesterol Maintain a desired weight Control your diabetes Keep your blood pressure down  Dialysis  It will take several weeks to several months for your new dialysis access to be ready for use. Your surgeon will determine when it is OK to use it. Your nephrologist will continue to direct your dialysis. You can continue to use your Permcath until your new access is ready for use.   03/31/2017 Oniya Mandarino [id number] 08/06/1932  Surgeon(s): Conrad [place], MD  Procedure(s): Right ARM Basilic vein transposition  x Do not stick fistula for 12 weeks    If you have any questions, please call the office at [phone].

## 2017-03-31 NOTE — OR Nursing (Signed)
Patient noted to have pacemaker in operating room on the left. Dr Bridgett Larsson discussed with patients daughter to perform surgery in patients right arm due to pacemaker.

## 2017-03-31 NOTE — Anesthesia Procedure Notes (Signed)
Procedure Name: MAC Date/Time: 03/31/2017 8:36 AM Performed by: Carney Living, CRNA Pre-anesthesia Checklist: Patient identified, Emergency Drugs available, Suction available, Patient being monitored and Timeout performed Patient Re-evaluated:Patient Re-evaluated prior to induction Oxygen Delivery Method: Simple face mask Preoxygenation: Pre-oxygenation with 100% oxygen

## 2017-03-31 NOTE — Anesthesia Postprocedure Evaluation (Signed)
Anesthesia Post Note  Patient: Valerie Terrell  Procedure(s) Performed: Right ARM Basilic vein transposition (Right Arm Upper)     Patient location during evaluation: PACU Anesthesia Type: MAC Level of consciousness: awake and alert and oriented Pain management: pain level controlled Vital Signs Assessment: post-procedure vital signs reviewed and stable Respiratory status: spontaneous breathing, nonlabored ventilation and respiratory function stable Cardiovascular status: stable and blood pressure returned to baseline Postop Assessment: no apparent nausea or vomiting Anesthetic complications: no    Last Vitals:  Vitals:   03/31/17 1135 03/31/17 1145  BP: (!) 134/56 (!) 114/55  Pulse: (!) 59 (!) 59  Resp: 14 17  Temp:    SpO2: 97% 96%    Last Pain:  Vitals:   03/31/17 1145  TempSrc:   PainSc: 0-No pain                 Coree Riester A.

## 2017-04-01 ENCOUNTER — Telehealth: Payer: Self-pay | Admitting: Vascular Surgery

## 2017-04-01 ENCOUNTER — Encounter (HOSPITAL_COMMUNITY): Payer: Self-pay | Admitting: Vascular Surgery

## 2017-04-01 DIAGNOSIS — N186 End stage renal disease: Secondary | ICD-10-CM | POA: Diagnosis not present

## 2017-04-01 DIAGNOSIS — D689 Coagulation defect, unspecified: Secondary | ICD-10-CM | POA: Diagnosis not present

## 2017-04-01 DIAGNOSIS — I4891 Unspecified atrial fibrillation: Secondary | ICD-10-CM | POA: Diagnosis not present

## 2017-04-01 DIAGNOSIS — Z4901 Encounter for fitting and adjustment of extracorporeal dialysis catheter: Secondary | ICD-10-CM | POA: Diagnosis not present

## 2017-04-01 DIAGNOSIS — E876 Hypokalemia: Secondary | ICD-10-CM | POA: Diagnosis not present

## 2017-04-01 DIAGNOSIS — D631 Anemia in chronic kidney disease: Secondary | ICD-10-CM | POA: Diagnosis not present

## 2017-04-01 LAB — CUP PACEART INCLINIC DEVICE CHECK
Brady Statistic RA Percent Paced: 41 %
Implantable Lead Implant Date: 20180719
Implantable Lead Location: 753860
Implantable Pulse Generator Implant Date: 20180719
Lead Channel Impedance Value: 512.5 Ohm
Lead Channel Pacing Threshold Amplitude: 0.75 V
Lead Channel Pacing Threshold Pulse Width: 0.5 ms
Lead Channel Sensing Intrinsic Amplitude: 12 mV
Lead Channel Setting Pacing Amplitude: 2 V
Lead Channel Setting Pacing Pulse Width: 0.5 ms
Lead Channel Setting Sensing Sensitivity: 2 mV
MDC IDC LEAD IMPLANT DT: 20180719
MDC IDC LEAD LOCATION: 753859
MDC IDC MSMT BATTERY REMAINING LONGEVITY: 134 mo
MDC IDC MSMT BATTERY VOLTAGE: 3.01 V
MDC IDC MSMT LEADCHNL RA IMPEDANCE VALUE: 400 Ohm
MDC IDC MSMT LEADCHNL RA PACING THRESHOLD AMPLITUDE: 0.75 V
MDC IDC MSMT LEADCHNL RA PACING THRESHOLD PULSEWIDTH: 0.5 ms
MDC IDC MSMT LEADCHNL RA SENSING INTR AMPL: 3.7 mV
MDC IDC SESS DTM: 20181024143325
MDC IDC SET LEADCHNL RV PACING AMPLITUDE: 2.5 V
MDC IDC STAT BRADY RV PERCENT PACED: 0.1 %
Pulse Gen Model: 2272
Pulse Gen Serial Number: 8918511

## 2017-04-01 LAB — PROTIME-INR: INR: 2.1 — AB (ref 0.9–1.1)

## 2017-04-01 NOTE — Telephone Encounter (Signed)
-----   Message from Mena Goes, RN sent at 03/31/2017 11:25 AM EST ----- Regarding: 4 weeks   ----- Message ----- From: Conrad Gilliam, MD Sent: 03/31/2017  11:02 AM To: [facility] Charge Pool  Valerie Terrell [id number] [birth year]   PROCEDURE: 1. right single stage basilic vein transposition (brachiobasilic arteriovenous fistula) placement  Asst: Leontine Locket, PAC   Follow-up: 4 weeks

## 2017-04-01 NOTE — Telephone Encounter (Signed)
Sched 04/30/17 at 4:00. Lm on hm#.

## 2017-04-02 ENCOUNTER — Telehealth: Payer: Self-pay | Admitting: Family Medicine

## 2017-04-02 NOTE — Telephone Encounter (Signed)
Noted  

## 2017-04-02 NOTE — Telephone Encounter (Signed)
Rande Lawman, physical therapist with [facility] calling to notify pcp that patient had procedure done today and cancelled physical therapy appt scheduled for today.  Therapist and patient have decided to hold physical therapy until patient is up to it.

## 2017-04-02 NOTE — Telephone Encounter (Signed)
FYI

## 2017-04-03 DIAGNOSIS — I4891 Unspecified atrial fibrillation: Secondary | ICD-10-CM | POA: Diagnosis not present

## 2017-04-03 DIAGNOSIS — E876 Hypokalemia: Secondary | ICD-10-CM | POA: Diagnosis not present

## 2017-04-03 DIAGNOSIS — N186 End stage renal disease: Secondary | ICD-10-CM | POA: Diagnosis not present

## 2017-04-03 DIAGNOSIS — D631 Anemia in chronic kidney disease: Secondary | ICD-10-CM | POA: Diagnosis not present

## 2017-04-03 DIAGNOSIS — Z4901 Encounter for fitting and adjustment of extracorporeal dialysis catheter: Secondary | ICD-10-CM | POA: Diagnosis not present

## 2017-04-03 DIAGNOSIS — D689 Coagulation defect, unspecified: Secondary | ICD-10-CM | POA: Diagnosis not present

## 2017-04-05 ENCOUNTER — Ambulatory Visit (INDEPENDENT_AMBULATORY_CARE_PROVIDER_SITE_OTHER): Payer: Medicare Other | Admitting: Pharmacist

## 2017-04-05 ENCOUNTER — Telehealth: Payer: Self-pay | Admitting: Family Medicine

## 2017-04-05 DIAGNOSIS — Z5181 Encounter for therapeutic drug level monitoring: Secondary | ICD-10-CM | POA: Diagnosis not present

## 2017-04-05 DIAGNOSIS — Z7901 Long term (current) use of anticoagulants: Secondary | ICD-10-CM | POA: Diagnosis not present

## 2017-04-05 DIAGNOSIS — Z79891 Long term (current) use of opiate analgesic: Secondary | ICD-10-CM | POA: Diagnosis not present

## 2017-04-05 DIAGNOSIS — Z4901 Encounter for fitting and adjustment of extracorporeal dialysis catheter: Secondary | ICD-10-CM | POA: Diagnosis not present

## 2017-04-05 DIAGNOSIS — J9621 Acute and chronic respiratory failure with hypoxia: Secondary | ICD-10-CM | POA: Diagnosis not present

## 2017-04-05 DIAGNOSIS — I480 Paroxysmal atrial fibrillation: Secondary | ICD-10-CM | POA: Diagnosis not present

## 2017-04-05 DIAGNOSIS — I130 Hypertensive heart and chronic kidney disease with heart failure and stage 1 through stage 4 chronic kidney disease, or unspecified chronic kidney disease: Secondary | ICD-10-CM | POA: Diagnosis not present

## 2017-04-05 DIAGNOSIS — D689 Coagulation defect, unspecified: Secondary | ICD-10-CM | POA: Diagnosis not present

## 2017-04-05 DIAGNOSIS — I255 Ischemic cardiomyopathy: Secondary | ICD-10-CM | POA: Diagnosis not present

## 2017-04-05 DIAGNOSIS — Z950 Presence of cardiac pacemaker: Secondary | ICD-10-CM | POA: Diagnosis not present

## 2017-04-05 DIAGNOSIS — N186 End stage renal disease: Secondary | ICD-10-CM | POA: Diagnosis not present

## 2017-04-05 DIAGNOSIS — I4891 Unspecified atrial fibrillation: Secondary | ICD-10-CM | POA: Diagnosis not present

## 2017-04-05 DIAGNOSIS — Z9981 Dependence on supplemental oxygen: Secondary | ICD-10-CM | POA: Diagnosis not present

## 2017-04-05 DIAGNOSIS — D638 Anemia in other chronic diseases classified elsewhere: Secondary | ICD-10-CM | POA: Diagnosis not present

## 2017-04-05 DIAGNOSIS — E876 Hypokalemia: Secondary | ICD-10-CM | POA: Diagnosis not present

## 2017-04-05 DIAGNOSIS — E785 Hyperlipidemia, unspecified: Secondary | ICD-10-CM | POA: Diagnosis not present

## 2017-04-05 DIAGNOSIS — I739 Peripheral vascular disease, unspecified: Secondary | ICD-10-CM | POA: Diagnosis not present

## 2017-04-05 DIAGNOSIS — I5043 Acute on chronic combined systolic (congestive) and diastolic (congestive) heart failure: Secondary | ICD-10-CM | POA: Diagnosis not present

## 2017-04-05 DIAGNOSIS — D631 Anemia in chronic kidney disease: Secondary | ICD-10-CM | POA: Diagnosis not present

## 2017-04-05 NOTE — Telephone Encounter (Signed)
Morey Hummingbird, nurse with [facility] calling to report patient was to be discharged today.  However, she would like to recert patient and is requesting a call back with verbal orders to continue care.  Tel# [phone]

## 2017-04-05 NOTE — Telephone Encounter (Signed)
SW Dr. Anitra Lauth okay to recert.   Morey Hummingbird advised and voiced understanding.

## 2017-04-06 ENCOUNTER — Encounter: Payer: Self-pay | Admitting: Family Medicine

## 2017-04-07 ENCOUNTER — Other Ambulatory Visit: Payer: Self-pay | Admitting: Pharmacist

## 2017-04-07 ENCOUNTER — Other Ambulatory Visit: Payer: Self-pay

## 2017-04-07 DIAGNOSIS — I4891 Unspecified atrial fibrillation: Secondary | ICD-10-CM | POA: Diagnosis not present

## 2017-04-07 DIAGNOSIS — E876 Hypokalemia: Secondary | ICD-10-CM | POA: Diagnosis not present

## 2017-04-07 DIAGNOSIS — D689 Coagulation defect, unspecified: Secondary | ICD-10-CM | POA: Diagnosis not present

## 2017-04-07 DIAGNOSIS — N186 End stage renal disease: Secondary | ICD-10-CM | POA: Diagnosis not present

## 2017-04-07 DIAGNOSIS — Z4901 Encounter for fitting and adjustment of extracorporeal dialysis catheter: Secondary | ICD-10-CM | POA: Diagnosis not present

## 2017-04-07 DIAGNOSIS — D631 Anemia in chronic kidney disease: Secondary | ICD-10-CM | POA: Diagnosis not present

## 2017-04-07 MED ORDER — WARFARIN SODIUM 3 MG PO TABS: ORAL_TABLET | ORAL | 0 refills | Status: DC

## 2017-04-07 MED ORDER — WARFARIN SODIUM 3 MG PO TABS: ORAL_TABLET | ORAL | 1 refills | Status: DC

## 2017-04-07 NOTE — Telephone Encounter (Signed)
Patient notified and verbalized understanding. 

## 2017-04-07 NOTE — Telephone Encounter (Signed)
Tell her this should be rx'd by the cardiologist.  It needs to consistently be rx'd by the same physician. If she is out of the med (or will run out over the next 4d), and she is unable to get cardiology to RF today, then let me know and I will eRx a small amount locally.  Let me know.-thx

## 2017-04-07 NOTE — Telephone Encounter (Signed)
Patient called requesting reorder for coumadin 3 mg. Please review and send to pharmacy and contact patient. 90 day supply - [facility].

## 2017-04-09 DIAGNOSIS — I5041 Acute combined systolic (congestive) and diastolic (congestive) heart failure: Secondary | ICD-10-CM | POA: Diagnosis not present

## 2017-04-10 DIAGNOSIS — I4891 Unspecified atrial fibrillation: Secondary | ICD-10-CM | POA: Diagnosis not present

## 2017-04-10 DIAGNOSIS — N186 End stage renal disease: Secondary | ICD-10-CM | POA: Diagnosis not present

## 2017-04-10 DIAGNOSIS — D631 Anemia in chronic kidney disease: Secondary | ICD-10-CM | POA: Diagnosis not present

## 2017-04-10 DIAGNOSIS — E876 Hypokalemia: Secondary | ICD-10-CM | POA: Diagnosis not present

## 2017-04-10 DIAGNOSIS — D689 Coagulation defect, unspecified: Secondary | ICD-10-CM | POA: Diagnosis not present

## 2017-04-10 DIAGNOSIS — Z4901 Encounter for fitting and adjustment of extracorporeal dialysis catheter: Secondary | ICD-10-CM | POA: Diagnosis not present

## 2017-04-13 ENCOUNTER — Ambulatory Visit: Payer: Medicare Other | Admitting: Cardiovascular Disease

## 2017-04-13 DIAGNOSIS — N186 End stage renal disease: Secondary | ICD-10-CM | POA: Diagnosis not present

## 2017-04-13 DIAGNOSIS — Z4901 Encounter for fitting and adjustment of extracorporeal dialysis catheter: Secondary | ICD-10-CM | POA: Diagnosis not present

## 2017-04-13 DIAGNOSIS — D631 Anemia in chronic kidney disease: Secondary | ICD-10-CM | POA: Diagnosis not present

## 2017-04-13 DIAGNOSIS — E876 Hypokalemia: Secondary | ICD-10-CM | POA: Diagnosis not present

## 2017-04-13 DIAGNOSIS — D689 Coagulation defect, unspecified: Secondary | ICD-10-CM | POA: Diagnosis not present

## 2017-04-13 DIAGNOSIS — I4891 Unspecified atrial fibrillation: Secondary | ICD-10-CM | POA: Diagnosis not present

## 2017-04-14 ENCOUNTER — Encounter: Payer: Self-pay | Admitting: Cardiovascular Disease

## 2017-04-14 ENCOUNTER — Ambulatory Visit (INDEPENDENT_AMBULATORY_CARE_PROVIDER_SITE_OTHER): Payer: Medicare Other | Admitting: Cardiovascular Disease

## 2017-04-14 VITALS — BP 100/56 | HR 60 | Ht 59.0 in | Wt 106.8 lb

## 2017-04-14 DIAGNOSIS — I5041 Acute combined systolic (congestive) and diastolic (congestive) heart failure: Secondary | ICD-10-CM | POA: Diagnosis not present

## 2017-04-14 DIAGNOSIS — E785 Hyperlipidemia, unspecified: Secondary | ICD-10-CM | POA: Diagnosis not present

## 2017-04-14 DIAGNOSIS — I10 Essential (primary) hypertension: Secondary | ICD-10-CM

## 2017-04-14 DIAGNOSIS — I480 Paroxysmal atrial fibrillation: Secondary | ICD-10-CM

## 2017-04-14 NOTE — Patient Instructions (Signed)
Medication Instructions: Your physician recommends that you continue on your current medications as directed. Please refer to the Current Medication list given to you today.   Follow-Up: We request that you follow-up in: 6 months with Kerin Ransom, PA and in 12 months with Dr Andria Rhein will receive a reminder letter in the mail two months in advance. If you don't receive a letter, please call our office to schedule the follow-up appointment.  If you need a refill on your cardiac medications before your next appointment, please call your pharmacy.

## 2017-04-14 NOTE — Assessment & Plan Note (Signed)
History of essential hypertension blood pressure measures 100/56. She is not on antihypertensive medications other than carvedilol currently. I suspect her dialysis has improved her blood pressure control as well.

## 2017-04-14 NOTE — Assessment & Plan Note (Signed)
History of paroxysmal atrial fibrillation on Coumadin anticoagulation status post permanent transvenous pacemaker insertion by Dr. Lovena Le in July for sick sinus syndrome.

## 2017-04-14 NOTE — Assessment & Plan Note (Signed)
History of combined systolic and diastolic heart failure with her most recent echo performed 12/02/16 revealing EF of 35-40%. Interestingly, her echo performed 07/14/16 revealed EF of 60-65%. She was on high-dose diuretics until she began hemodialysis back in September. Recent AV fistula was placed by Dr. Bridgett Larsson 2 weeks ago.

## 2017-04-14 NOTE — Progress Notes (Signed)
04/14/2017 Valerie Terrell   Nov 13, 1932  [id number]  Primary Physician McGowen, Adrian Blackwater, MD Primary Cardiologist: Lorretta Harp MD [name], [place]  HPI:  Valerie Terrell is a 83 y.o. female widowed Caucasian female mother of one daughter Valerie Terrell who accompanies her today andwho I last saw in the office  09/23/16. 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 She smoked remotely and drinks socially. She does have a history of difficult to control hypertension. Hyperlipidemia. She had a 2-D echo which was normal, a Myoview stress test which was normal and a CT angiogram performed 01/29/16 that showed no evidence of osseous stenosis. She does have difficult to control hypertension with a recent renal Doppler study performed 02/26/16 that showed an atrophic nonfunctioning left kidney and a right kidney of measuring 11 cm with at least a moderate right renal artery stenosis. Her serum creatinine in September was 1.15 and currently isn't elicited is in the 1.6 range potentially related to hay her ARB. Her ARB was stopped her serum creatinine fell to 1.4. She saw Dr. Erling Cruz, nephrology, who increased her hydralazine and clonidine. She was intolerant to her higher hydralazine dose and seems to be intolerant to the clonidine as well. She had difficult to control hypertension, progressive renal insufficiency and bilateral lower extremity edema on high-dose diuretics. She was hospitalized in July for combined systolic and diastolic heart failure and had a permanent transvenous pacemaker placed by Dr. Lovena Le for tachybradycardia syndrome. She was admitted again in September and began dialysis. Her EF in July was 35-40% which is reduced compared to the EF in September 60 to 65%. Her Eliquis  was changed to Coumadin. She is currently being dialyzed 3 times a  week. Her edema has improved as has her blood pressure control and her breathing.    Current Meds  Medication Sig  . acetaminophen (TYLENOL) 500 MG tablet Take 500-1,000 mg 3 (three) times daily as needed by mouth for moderate pain.   [name] amiodarone (PACERONE) 200 MG tablet Take 1 tablet (200 mg total) by mouth daily.  . carvedilol (COREG) 25 MG tablet Take 1 tablet (25 mg total) 2 (two) times daily with a meal by mouth.  . isosorbide mononitrate (IMDUR) 60 MG 24 hr tablet Take 1 tablet (60 mg total) daily by mouth.  . Multiple Vitamin (MULTIVITAMIN) tablet Take 1 tablet by mouth 3 (three) times a week.   . ondansetron (ZOFRAN) 4 MG tablet Take 0.5-1 tablets (2-4 mg total) by mouth every 8 (eight) hours as needed for nausea or vomiting.  [name] oxyCODONE-acetaminophen (PERCOCET/ROXICET) 5-325 MG tablet Take 1-2 tablets every 6 (six) hours as needed by mouth for severe pain.  . polyethylene glycol (MIRALAX / GLYCOLAX) packet Take 17 g by mouth daily as needed. (Patient taking differently: Take 17 g daily as needed by mouth for mild constipation. )  . pravastatin (PRAVACHOL) 40 MG tablet Take 1 tablet (40 mg total) every evening by mouth.  . sevelamer carbonate (RENVELA) 800 MG tablet Take 800 mg by mouth 2 (two) times daily with a meal.  . vitamin C (ASCORBIC ACID) 500 MG tablet Take 500 mg by mouth daily.  . Vitamin D, Ergocalciferol, (DRISDOL) 50000 units CAPS capsule Take 50,000 Units by mouth every 7 (seven) days.  [name] warfarin (COUMADIN) 3 MG tablet Take 1/2 to 1  tablets daily as directed by coumadin clinic     Allergies  Allergen Reactions  . Clonidine Derivatives Palpitations and Other (See Comments)    VERY SEDATED  . Codeine Nausea Only  . Nickel Rash  . Sulfa Antibiotics Other (See Comments)    Reaction unknown    Social History   Socioeconomic History  . Marital status: Widowed    Spouse name: Not on file  . Number of children: Not on file  . Years of education: Not on file  .  Highest education level: Not on file  Social Needs  . Financial resource strain: Not on file  . Food insecurity - worry: Not on file  . Food insecurity - inability: Not on file  . Transportation needs - medical: Not on file  . Transportation needs - non-medical: Not on file  Occupational History  . Not on file  Tobacco Use  . Smoking status: Former Smoker    Years: 22.00    Last attempt to quit: 03/17/1972    Years since quitting: 45.1  . Smokeless tobacco: Never Used  Substance and Sexual Activity  . Alcohol use: Yes    Alcohol/week: 0.6 oz    Types: 1 Glasses of wine per week    Comment: wine  . Drug use: No  . Sexual activity: No  Other Topics Concern  . Not on file  Social History Narrative   Widow.  One daughter, lives with her.   Educ: college   Occup: retired Marine scientist.   No T/A/Ds.   She is almost a vegetarian.     Review of Systems: General: negative for chills, fever, night sweats or weight changes.  Cardiovascular: negative for chest pain, dyspnea on exertion, edema, orthopnea, palpitations, paroxysmal nocturnal dyspnea or shortness of breath Dermatological: negative for rash Respiratory: negative for cough or wheezing Urologic: negative for hematuria Abdominal: negative for nausea, vomiting, diarrhea, bright red blood per rectum, melena, or hematemesis Neurologic: negative for visual changes, syncope, or dizziness All other systems reviewed and are otherwise negative except as noted above.    Blood pressure (!) 100/56, pulse 60, height 4\' 11"  (1.499 m), weight 106 lb 12.8 oz (48.4 kg).  General appearance: alert and no distress Neck: no adenopathy, no carotid bruit, no JVD, supple, symmetrical, trachea midline and thyroid not enlarged, symmetric, no tenderness/mass/nodules Lungs: clear to auscultation bilaterally Heart: regular rate and rhythm, S1, S2 normal, no murmur, click, rub or gallop Extremities: extremities normal, atraumatic, no cyanosis or  edema Pulses: 2+ and symmetric Skin: Skin color, texture, turgor normal. No rashes or lesions Neurologic: Alert and oriented X 3, normal strength and tone. Normal symmetric reflexes. Normal coordination and gait  EKG atrially paced rhythm at 60 with rightward axis nonspecific ST and T-wave changes. I personally reviewed this EKG.  ASSESSMENT AND PLAN:   PAF (paroxysmal atrial fibrillation) (HCC) History of paroxysmal atrial fibrillation on Coumadin anticoagulation status post permanent transvenous pacemaker insertion by Dr. Lovena Le in July for sick sinus syndrome.  Essential hypertension History of essential hypertension blood pressure measures 100/56. She is not on antihypertensive medications other than carvedilol currently. I suspect her dialysis has improved her blood pressure control as well.  Dyslipidemia History of dyslipidemia on statin therapy with recent lipid profile performed 06/17/16 revealing LDL 72 and HDL of 39.  Acute combined systolic and diastolic heart failure (HCC)  History of combined systolic and diastolic heart failure with her most recent echo performed 12/02/16 revealing EF of 35-40%. Interestingly, her echo  performed 07/14/16 revealed EF of 60-65%. She was on high-dose diuretics until she began hemodialysis back in September. Recent AV fistula was placed by Dr. Bridgett Larsson 2 weeks ago.      Lorretta Harp MD FACP,FACC,FAHA, [facility] 04/14/2017 10:56 AM

## 2017-04-14 NOTE — Assessment & Plan Note (Signed)
History of dyslipidemia on statin therapy with recent lipid profile performed 06/17/16 revealing LDL 72 and HDL of 39.

## 2017-04-15 ENCOUNTER — Encounter: Payer: Self-pay | Admitting: Family Medicine

## 2017-04-15 DIAGNOSIS — E876 Hypokalemia: Secondary | ICD-10-CM | POA: Diagnosis not present

## 2017-04-15 DIAGNOSIS — D631 Anemia in chronic kidney disease: Secondary | ICD-10-CM | POA: Diagnosis not present

## 2017-04-15 DIAGNOSIS — Z4901 Encounter for fitting and adjustment of extracorporeal dialysis catheter: Secondary | ICD-10-CM | POA: Diagnosis not present

## 2017-04-15 DIAGNOSIS — N186 End stage renal disease: Secondary | ICD-10-CM | POA: Diagnosis not present

## 2017-04-15 DIAGNOSIS — I4891 Unspecified atrial fibrillation: Secondary | ICD-10-CM | POA: Diagnosis not present

## 2017-04-15 DIAGNOSIS — D689 Coagulation defect, unspecified: Secondary | ICD-10-CM | POA: Diagnosis not present

## 2017-04-16 ENCOUNTER — Telehealth: Payer: Self-pay | Admitting: *Deleted

## 2017-04-16 DIAGNOSIS — I480 Paroxysmal atrial fibrillation: Secondary | ICD-10-CM | POA: Diagnosis not present

## 2017-04-16 DIAGNOSIS — I255 Ischemic cardiomyopathy: Secondary | ICD-10-CM | POA: Diagnosis not present

## 2017-04-16 DIAGNOSIS — E785 Hyperlipidemia, unspecified: Secondary | ICD-10-CM | POA: Diagnosis not present

## 2017-04-16 DIAGNOSIS — Z79891 Long term (current) use of opiate analgesic: Secondary | ICD-10-CM | POA: Diagnosis not present

## 2017-04-16 DIAGNOSIS — I5043 Acute on chronic combined systolic (congestive) and diastolic (congestive) heart failure: Secondary | ICD-10-CM | POA: Diagnosis not present

## 2017-04-16 DIAGNOSIS — I120 Hypertensive chronic kidney disease with stage 5 chronic kidney disease or end stage renal disease: Secondary | ICD-10-CM | POA: Diagnosis not present

## 2017-04-16 DIAGNOSIS — I739 Peripheral vascular disease, unspecified: Secondary | ICD-10-CM | POA: Diagnosis not present

## 2017-04-16 DIAGNOSIS — Z992 Dependence on renal dialysis: Secondary | ICD-10-CM | POA: Diagnosis not present

## 2017-04-16 DIAGNOSIS — Z950 Presence of cardiac pacemaker: Secondary | ICD-10-CM | POA: Diagnosis not present

## 2017-04-16 DIAGNOSIS — N186 End stage renal disease: Secondary | ICD-10-CM | POA: Diagnosis not present

## 2017-04-16 DIAGNOSIS — J9621 Acute and chronic respiratory failure with hypoxia: Secondary | ICD-10-CM | POA: Diagnosis not present

## 2017-04-16 DIAGNOSIS — Z5181 Encounter for therapeutic drug level monitoring: Secondary | ICD-10-CM | POA: Diagnosis not present

## 2017-04-16 DIAGNOSIS — I130 Hypertensive heart and chronic kidney disease with heart failure and stage 1 through stage 4 chronic kidney disease, or unspecified chronic kidney disease: Secondary | ICD-10-CM | POA: Diagnosis not present

## 2017-04-16 DIAGNOSIS — D638 Anemia in other chronic diseases classified elsewhere: Secondary | ICD-10-CM | POA: Diagnosis not present

## 2017-04-16 DIAGNOSIS — Z9981 Dependence on supplemental oxygen: Secondary | ICD-10-CM | POA: Diagnosis not present

## 2017-04-16 NOTE — Telephone Encounter (Signed)
Home Health nurse called stating that pts BP was 64/34 and pt stated that she was very dizzy. She wanted to know if she should go ahead and call EMS. SW Dr. Anitra Lauth, he said yes call EMS. [facility] nurse advised and voiced understanding.

## 2017-04-16 NOTE — Telephone Encounter (Signed)
Spoke with [place] from [facility]. Reports patients BP extremely low, up to 78/36 after pushing oral fluids. Patient and daughter refusing EMS/ER evaluation. Morey Hummingbird offered IV fluids to patient (per Dr. Anitra Lauth), patient refuses.   Spoke with patient, she states she feels much better after eating lunch and drinking. Denies dizziness, lightheadedness, SOB or CP. Daughter is with patient and plans to stay with her the rest of the day. Patient states if she begins to feel bad, she will go to the Emergency Room.

## 2017-04-16 NOTE — Telephone Encounter (Signed)
[place] [facility] Patient refused EMS, we gave some fluids and blood pressure came up to  78/36,  Daughter is with her now.

## 2017-04-16 NOTE — Telephone Encounter (Signed)
FYI

## 2017-04-17 DIAGNOSIS — Z4901 Encounter for fitting and adjustment of extracorporeal dialysis catheter: Secondary | ICD-10-CM | POA: Diagnosis not present

## 2017-04-17 DIAGNOSIS — D631 Anemia in chronic kidney disease: Secondary | ICD-10-CM | POA: Diagnosis not present

## 2017-04-17 DIAGNOSIS — Z23 Encounter for immunization: Secondary | ICD-10-CM | POA: Diagnosis not present

## 2017-04-17 DIAGNOSIS — E876 Hypokalemia: Secondary | ICD-10-CM | POA: Diagnosis not present

## 2017-04-17 DIAGNOSIS — D689 Coagulation defect, unspecified: Secondary | ICD-10-CM | POA: Diagnosis not present

## 2017-04-17 DIAGNOSIS — I4891 Unspecified atrial fibrillation: Secondary | ICD-10-CM | POA: Diagnosis not present

## 2017-04-17 DIAGNOSIS — N186 End stage renal disease: Secondary | ICD-10-CM | POA: Diagnosis not present

## 2017-04-17 NOTE — Telephone Encounter (Signed)
Agree/noted. 

## 2017-04-20 ENCOUNTER — Telehealth: Payer: Self-pay | Admitting: Family Medicine

## 2017-04-20 DIAGNOSIS — E876 Hypokalemia: Secondary | ICD-10-CM | POA: Diagnosis not present

## 2017-04-20 DIAGNOSIS — I4891 Unspecified atrial fibrillation: Secondary | ICD-10-CM | POA: Diagnosis not present

## 2017-04-20 DIAGNOSIS — D631 Anemia in chronic kidney disease: Secondary | ICD-10-CM | POA: Diagnosis not present

## 2017-04-20 DIAGNOSIS — Z23 Encounter for immunization: Secondary | ICD-10-CM | POA: Diagnosis not present

## 2017-04-20 DIAGNOSIS — Z4901 Encounter for fitting and adjustment of extracorporeal dialysis catheter: Secondary | ICD-10-CM | POA: Diagnosis not present

## 2017-04-20 DIAGNOSIS — R5381 Other malaise: Secondary | ICD-10-CM | POA: Diagnosis not present

## 2017-04-20 DIAGNOSIS — N186 End stage renal disease: Secondary | ICD-10-CM | POA: Diagnosis not present

## 2017-04-20 DIAGNOSIS — D689 Coagulation defect, unspecified: Secondary | ICD-10-CM | POA: Diagnosis not present

## 2017-04-20 NOTE — Telephone Encounter (Signed)
I'll write order, but I don't know who we would send it to (Home health?  Don't these have to be professionally built so they meet certain safety specifications?).  Let me know-thx.

## 2017-04-20 NOTE — Telephone Encounter (Signed)
See patient request 

## 2017-04-20 NOTE — Telephone Encounter (Signed)
Please advise. Thanks.  

## 2017-04-20 NOTE — Telephone Encounter (Signed)
Copied from [place]. Topic: Quick Communication - See Telephone Encounter >> Apr 20, 2017  8:30 AM Burnis Medin, NT wrote: CRM for notification. See Telephone encounter for: Pt. Is calling to see if she can talk to someone about getting a order to have a ramp for her home. Pt is having difficulty getting up and down the steps. Pt. Asks that if Nira Conn could call her back.  04/20/17.

## 2017-04-20 NOTE — Telephone Encounter (Signed)
SW pts daughter and she stated that they have spoke to a builder and have received an estimate. And would like to pick up the order to file to pts insurance for reimbursement. Order put up front for pick up. Copy made for chart.

## 2017-04-21 ENCOUNTER — Telehealth: Payer: Self-pay | Admitting: *Deleted

## 2017-04-21 DIAGNOSIS — I480 Paroxysmal atrial fibrillation: Secondary | ICD-10-CM | POA: Diagnosis not present

## 2017-04-21 DIAGNOSIS — Z9981 Dependence on supplemental oxygen: Secondary | ICD-10-CM | POA: Diagnosis not present

## 2017-04-21 DIAGNOSIS — Z950 Presence of cardiac pacemaker: Secondary | ICD-10-CM | POA: Diagnosis not present

## 2017-04-21 DIAGNOSIS — J9621 Acute and chronic respiratory failure with hypoxia: Secondary | ICD-10-CM | POA: Diagnosis not present

## 2017-04-21 DIAGNOSIS — I130 Hypertensive heart and chronic kidney disease with heart failure and stage 1 through stage 4 chronic kidney disease, or unspecified chronic kidney disease: Secondary | ICD-10-CM | POA: Diagnosis not present

## 2017-04-21 DIAGNOSIS — E785 Hyperlipidemia, unspecified: Secondary | ICD-10-CM | POA: Diagnosis not present

## 2017-04-21 DIAGNOSIS — I255 Ischemic cardiomyopathy: Secondary | ICD-10-CM | POA: Diagnosis not present

## 2017-04-21 DIAGNOSIS — Z5181 Encounter for therapeutic drug level monitoring: Secondary | ICD-10-CM | POA: Diagnosis not present

## 2017-04-21 DIAGNOSIS — I739 Peripheral vascular disease, unspecified: Secondary | ICD-10-CM | POA: Diagnosis not present

## 2017-04-21 DIAGNOSIS — I5043 Acute on chronic combined systolic (congestive) and diastolic (congestive) heart failure: Secondary | ICD-10-CM | POA: Diagnosis not present

## 2017-04-21 DIAGNOSIS — D638 Anemia in other chronic diseases classified elsewhere: Secondary | ICD-10-CM | POA: Diagnosis not present

## 2017-04-21 DIAGNOSIS — N186 End stage renal disease: Secondary | ICD-10-CM | POA: Diagnosis not present

## 2017-04-21 DIAGNOSIS — Z79891 Long term (current) use of opiate analgesic: Secondary | ICD-10-CM | POA: Diagnosis not present

## 2017-04-21 NOTE — Telephone Encounter (Signed)
Call from [facility] nurse patient c/o continued pain at elbow. Denies grip pain, any signs of infection and has minimal swelling. Good Bruit and thrill. Good pulses.  Nurse states the patient mentions this pain at every visit. Staples intact. Agreeable to see NP on 04/23/17

## 2017-04-22 DIAGNOSIS — N186 End stage renal disease: Secondary | ICD-10-CM | POA: Diagnosis not present

## 2017-04-22 DIAGNOSIS — E876 Hypokalemia: Secondary | ICD-10-CM | POA: Diagnosis not present

## 2017-04-22 DIAGNOSIS — Z4901 Encounter for fitting and adjustment of extracorporeal dialysis catheter: Secondary | ICD-10-CM | POA: Diagnosis not present

## 2017-04-22 DIAGNOSIS — D689 Coagulation defect, unspecified: Secondary | ICD-10-CM | POA: Diagnosis not present

## 2017-04-22 DIAGNOSIS — I4891 Unspecified atrial fibrillation: Secondary | ICD-10-CM | POA: Diagnosis not present

## 2017-04-22 DIAGNOSIS — Z23 Encounter for immunization: Secondary | ICD-10-CM | POA: Diagnosis not present

## 2017-04-22 DIAGNOSIS — D631 Anemia in chronic kidney disease: Secondary | ICD-10-CM | POA: Diagnosis not present

## 2017-04-22 LAB — PROTIME-INR: INR: 8.3 — AB (ref 0.9–1.1)

## 2017-04-22 NOTE — Telephone Encounter (Signed)
No

## 2017-04-23 ENCOUNTER — Encounter: Payer: Self-pay | Admitting: Family

## 2017-04-23 ENCOUNTER — Ambulatory Visit (INDEPENDENT_AMBULATORY_CARE_PROVIDER_SITE_OTHER): Payer: Self-pay | Admitting: Family

## 2017-04-23 VITALS — BP 193/75 | HR 72 | Temp 97.8°F | Resp 18 | Wt 107.5 lb

## 2017-04-23 DIAGNOSIS — L7682 Other postprocedural complications of skin and subcutaneous tissue: Secondary | ICD-10-CM

## 2017-04-23 DIAGNOSIS — Z992 Dependence on renal dialysis: Secondary | ICD-10-CM

## 2017-04-23 DIAGNOSIS — I770 Arteriovenous fistula, acquired: Secondary | ICD-10-CM

## 2017-04-23 DIAGNOSIS — N186 End stage renal disease: Secondary | ICD-10-CM

## 2017-04-23 NOTE — Progress Notes (Signed)
Postoperative Access Visit   History of Present Illness  Valerie Terrell is a 83 y.o. year old female who is s/p right single stage basilic vein transposition (brachiobasilic arteriovenous fistula) placement on 03-31-17 by Dr. Bridgett Larsson.   Pt returns today after call from [place] on 04-21-17;  patient c/o continued pain at elbow. Denies grip pain, any signs of infection and has minimal swelling. Good bruit and thrill. Good pulses.  Nurse states the patient mentions this pain at every visit. Staples intact, pt c/o irritation from the staples in the antecubital area.   She has a 4 week post operative follow up with Dr. Bridgett Larsson on 04-30-17.   She dialyzes T-TH-S via right upper chest catheter.  Pt denies steal sx's in right UE.  The patient is able to complete their activities of daily living.    For VQI Use Only  PRE-ADM LIVING: Home  AMB STATUS: Ambulatory   Past Medical History:  Diagnosis Date  . Anemia of chronic disease 2018   + anemia of CRI?  [name] Atrophy of left kidney    with absent blood flow by renal artery dopplers (Dr. Gwenlyn Found)  . 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 Chronic combined systolic and diastolic CHF (congestive heart failure) ([place])    a. 11/2016: echo showing EF of 35-40%, RV strain noted, mild MR and mild TR.   [name] Chronic hypoxemic respiratory failure ([place])   . Debilitated patient    WC dependent as of 2018  . Dysrhythmia    afib  . ESRD on hemodialysis ([place]) 01/2017   T/Th/Sat schedule- [place].  Plan for basilic vein fistula surgery 04/2017.  . Family history of adverse reaction to anesthesia    Daughter- nausea  . History of adenomatous polyp of colon   . History of kidney stones    passed  . History of subarachnoid hemorrhage 10/2014   after syncope and while on xarelto  . HOH (hard of hearing)   . Hyperlipidemia   . Hypertension    Difficult to control, in the setting of one functioning kidney: pt was referred to  nephrology by Dr. Gwenlyn Found 06/2015.  . Lumbar radiculopathy 2012  . Lumbar spondylosis    MR 07/2016---no sign of spinal nerve compression or cord compression.  Pt set up with outpt ortho while admitted to hosp 07/2016.  . Malnourished ([place])   . Metatarsal fracture 06/10/2016   Nondisplaced, left 5th metatarsal--pt was referred to ortho  . Nonischemic cardiomyopathy ([place])   . Osteopenia 2014   T-score -2.1  . PAF (paroxysmal atrial fibrillation) (HCC)    Eliquis started after BRAO and CVA.  She was changed to warfarin 2018--INR/coumadin mgmt per HD/nephrol.  . Presence of permanent cardiac pacemaker   . Pulmonary edema 11/2016  . Right rib fracture 12/2016   s/p fall  . Sick sinus syndrome ([place])    Dual chamber pacer insertion 2018 (Dr. Lovena Le)  . Stroke [facility])    cardioembolic (had CVA while on no anticoag)--"scattered subacute punctate infarcts: 1 in R parietal lobe and 2 in occipital cortex" on MRI br.  CT angio head/neck: aortic arch athero.   . Thoracic back pain 01/2017   Facet?  Dr. Nelva Bush to do steroid injection as of 02/2017  . Thoracic compression fracture ([place]) 07/2016   T7 and T8-- T8 kyphoplasty during hosp admission 07/2016.  Neuro referred pt to pain mgmt for consideration of injection 09/2016.  I referred her to endo  08/2016 for consideration of calcitonin treatment.    Past Surgical History:  Procedure Laterality Date  . APPENDECTOMY  child  . AV FISTULA PLACEMENT Right 03/31/2017   Procedure: Right ARM Basilic vein transposition;  Surgeon: Conrad Kotlik, MD;  Location: [facility];  Service: Vascular;  Laterality: Right;  . CARDIOVASCULAR STRESS TEST  01/31/2016   Stress myoview: NORMAL/Low risk.  EF 56%.  . [place]  . COLONOSCOPY  2015   + hx of adenomatous polyps.  Need digest health spec in [place] records to see when pt due for next colonoscopy  . EYE SURGERY Bilateral    Catarct  . INSERTION OF DIALYSIS CATHETER Right 01/29/2017   Procedure: INSERTION OF  DIALYSIS CATHETER- RIGHT INTERNAL JUGULAR;  Surgeon: [name], MD;  Location: [facility] OR;  Service: Vascular;  Laterality: Right;  . IR GENERIC HISTORICAL  07/24/2016   IR KYPHO THORACIC WITH BONE BIOPSY 07/24/2016 Luanne Bras, MD [facility]-INTERV RAD  . KYPHOPLASTY  07/27/2016   T8  . PACEMAKER IMPLANT N/A 12/03/2016   Procedure: Pacemaker Implant;  Surgeon: Evans Lance, MD;  Location: [facility];  Service: Cardiovascular;  Laterality: N/A;  . Renal artery dopplers  02/26/2016   Her right renal dimension was 11 cm pole to pole with mild to moderate right renal artery stenosis. A right renal aortic ratio was 3.22 suggesting less than a 50% stenosis.  . TONSILLECTOMY    . TRANSTHORACIC ECHOCARDIOGRAM  01/29/2016; 11/2016   2017: EF 55-60%, normal LV wall motion, grade I DD.  No cardiac source of emboli was seen.  2018: EF 35-40%, could not assess DD due to a-fib, pulm HTN noted.    Social History   Socioeconomic History  . Marital status: Widowed    Spouse name: Not on file  . Number of children: Not on file  . Years of education: Not on file  . Highest education level: Not on file  Social Needs  . Financial resource strain: Not on file  . Food insecurity - worry: Not on file  . Food insecurity - inability: Not on file  . Transportation needs - medical: Not on file  . Transportation needs - non-medical: Not on file  Occupational History  . Not on file  Tobacco Use  . Smoking status: Former Smoker    Years: 22.00    Last attempt to quit: 03/17/1972    Years since quitting: 45.1  . Smokeless tobacco: Never Used  Substance and Sexual Activity  . Alcohol use: Yes    Alcohol/week: 0.6 oz    Types: 1 Glasses of wine per week    Comment: wine  . Drug use: No  . Sexual activity: No  Other Topics Concern  . Not on file  Social History Narrative   Widow.  One daughter, lives with her.   Educ: college   Occup: retired Marine scientist.   No T/A/Ds.   She is almost a vegetarian.     Allergies  Allergen Reactions  . Clonidine Derivatives Palpitations and Other (See Comments)    VERY SEDATED  . Codeine Nausea Only  . Nickel Rash  . Sulfa Antibiotics Other (See Comments)    Reaction unknown    Current Outpatient Medications on File Prior to Visit  Medication Sig Dispense Refill  . acetaminophen (TYLENOL) 500 MG tablet Take 500-1,000 mg 3 (three) times daily as needed by mouth for moderate pain.     [name] amiodarone (PACERONE) 200 MG tablet Take  1 tablet (200 mg total) by mouth daily. 30 tablet 0  . carvedilol (COREG) 25 MG tablet Take 1 tablet (25 mg total) 2 (two) times daily with a meal by mouth. 180 tablet 1  . isosorbide mononitrate (IMDUR) 60 MG 24 hr tablet Take 1 tablet (60 mg total) daily by mouth. 90 tablet 1  . Multiple Vitamin (MULTIVITAMIN) tablet Take 1 tablet by mouth 3 (three) times a week.     . ondansetron (ZOFRAN) 4 MG tablet Take 0.5-1 tablets (2-4 mg total) by mouth every 8 (eight) hours as needed for nausea or vomiting. 90 tablet 3  . oxyCODONE-acetaminophen (PERCOCET/ROXICET) 5-325 MG tablet Take 1-2 tablets every 6 (six) hours as needed by mouth for severe pain. 8 tablet 0  . polyethylene glycol (MIRALAX / GLYCOLAX) packet Take 17 g by mouth daily as needed. (Patient taking differently: Take 17 g daily as needed by mouth for mild constipation. ) 10 each 0  . pravastatin (PRAVACHOL) 40 MG tablet Take 1 tablet (40 mg total) every evening by mouth. 90 tablet 1  . sevelamer carbonate (RENVELA) 800 MG tablet Take 800 mg by mouth 2 (two) times daily with a meal.    . vitamin C (ASCORBIC ACID) 500 MG tablet Take 500 mg by mouth daily.    . Vitamin D, Ergocalciferol, (DRISDOL) 50000 units CAPS capsule Take 50,000 Units by mouth every 7 (seven) days.    [name] warfarin (COUMADIN) 3 MG tablet Take 1/2 to 1 tablets daily as directed by coumadin clinic 90 tablet 1   No current facility-administered medications on file prior to visit.       Physical  Examination Vitals:   04/23/17 1449  BP: (!) 193/75  Pulse: 72  Resp: 18  Temp: 97.8 F (36.6 C)  TempSrc: Oral  SpO2: 96%  Weight: 107 lb 8 oz (48.8 kg)   Body mass index is 21.71 kg/m.  Right upper arm Incision is healed, skin feels warm and normal, hand grip is 5/5, sensation in digits is intact, palpable thrill, bruit can be auscultated.  Right radial pulse is 2+ palpable.   Medical Decision Making  Hadlie Gipson is a 83 y.o. year old female who presents s/p right single stage basilic vein transposition (brachiobasilic arteriovenous fistula) placement on 03-31-17. Dr. Bridgett Larsson spoke with pt and daughter and examined pt.  All staples removed from right upper arm incision which is well healed.  The patient's access will be ready for use in 1 week.  The patient's tunneled dialysis catheter can be removed after two successful cannulations and completed dialysis treatments.  Follow up with [place] as needed, she does not need to return on 04-30-17 to see Dr. Bridgett Larsson.   Thank you for allowing [place] to participate in this patient's care.  Clemon Chambers, RN, MSN, FNP-C [facility] Office: [phone]  04/23/2017, 3:34 PM  Clinic MD: Bridgett Larsson

## 2017-04-24 DIAGNOSIS — N186 End stage renal disease: Secondary | ICD-10-CM | POA: Diagnosis not present

## 2017-04-24 DIAGNOSIS — D689 Coagulation defect, unspecified: Secondary | ICD-10-CM | POA: Diagnosis not present

## 2017-04-24 DIAGNOSIS — D631 Anemia in chronic kidney disease: Secondary | ICD-10-CM | POA: Diagnosis not present

## 2017-04-24 DIAGNOSIS — Z23 Encounter for immunization: Secondary | ICD-10-CM | POA: Diagnosis not present

## 2017-04-24 DIAGNOSIS — Z4901 Encounter for fitting and adjustment of extracorporeal dialysis catheter: Secondary | ICD-10-CM | POA: Diagnosis not present

## 2017-04-24 DIAGNOSIS — E876 Hypokalemia: Secondary | ICD-10-CM | POA: Diagnosis not present

## 2017-04-24 DIAGNOSIS — I4891 Unspecified atrial fibrillation: Secondary | ICD-10-CM | POA: Diagnosis not present

## 2017-04-27 DIAGNOSIS — Z4901 Encounter for fitting and adjustment of extracorporeal dialysis catheter: Secondary | ICD-10-CM | POA: Diagnosis not present

## 2017-04-27 DIAGNOSIS — Z23 Encounter for immunization: Secondary | ICD-10-CM | POA: Diagnosis not present

## 2017-04-27 DIAGNOSIS — D689 Coagulation defect, unspecified: Secondary | ICD-10-CM | POA: Diagnosis not present

## 2017-04-27 DIAGNOSIS — D631 Anemia in chronic kidney disease: Secondary | ICD-10-CM | POA: Diagnosis not present

## 2017-04-27 DIAGNOSIS — I4891 Unspecified atrial fibrillation: Secondary | ICD-10-CM | POA: Diagnosis not present

## 2017-04-27 DIAGNOSIS — E876 Hypokalemia: Secondary | ICD-10-CM | POA: Diagnosis not present

## 2017-04-27 DIAGNOSIS — N186 End stage renal disease: Secondary | ICD-10-CM | POA: Diagnosis not present

## 2017-04-28 DIAGNOSIS — I739 Peripheral vascular disease, unspecified: Secondary | ICD-10-CM | POA: Diagnosis not present

## 2017-04-28 DIAGNOSIS — Z79891 Long term (current) use of opiate analgesic: Secondary | ICD-10-CM | POA: Diagnosis not present

## 2017-04-28 DIAGNOSIS — E785 Hyperlipidemia, unspecified: Secondary | ICD-10-CM | POA: Diagnosis not present

## 2017-04-28 DIAGNOSIS — I480 Paroxysmal atrial fibrillation: Secondary | ICD-10-CM | POA: Diagnosis not present

## 2017-04-28 DIAGNOSIS — I5043 Acute on chronic combined systolic (congestive) and diastolic (congestive) heart failure: Secondary | ICD-10-CM | POA: Diagnosis not present

## 2017-04-28 DIAGNOSIS — Z9981 Dependence on supplemental oxygen: Secondary | ICD-10-CM | POA: Diagnosis not present

## 2017-04-28 DIAGNOSIS — I130 Hypertensive heart and chronic kidney disease with heart failure and stage 1 through stage 4 chronic kidney disease, or unspecified chronic kidney disease: Secondary | ICD-10-CM | POA: Diagnosis not present

## 2017-04-28 DIAGNOSIS — I255 Ischemic cardiomyopathy: Secondary | ICD-10-CM | POA: Diagnosis not present

## 2017-04-28 DIAGNOSIS — J9621 Acute and chronic respiratory failure with hypoxia: Secondary | ICD-10-CM | POA: Diagnosis not present

## 2017-04-28 DIAGNOSIS — N186 End stage renal disease: Secondary | ICD-10-CM | POA: Diagnosis not present

## 2017-04-28 DIAGNOSIS — D638 Anemia in other chronic diseases classified elsewhere: Secondary | ICD-10-CM | POA: Diagnosis not present

## 2017-04-28 DIAGNOSIS — Z5181 Encounter for therapeutic drug level monitoring: Secondary | ICD-10-CM | POA: Diagnosis not present

## 2017-04-28 DIAGNOSIS — Z950 Presence of cardiac pacemaker: Secondary | ICD-10-CM | POA: Diagnosis not present

## 2017-04-29 ENCOUNTER — Ambulatory Visit (INDEPENDENT_AMBULATORY_CARE_PROVIDER_SITE_OTHER): Payer: Medicare Other | Admitting: Pharmacist Clinician (PhC)/ Clinical Pharmacy Specialist

## 2017-04-29 DIAGNOSIS — E876 Hypokalemia: Secondary | ICD-10-CM | POA: Diagnosis not present

## 2017-04-29 DIAGNOSIS — D689 Coagulation defect, unspecified: Secondary | ICD-10-CM | POA: Diagnosis not present

## 2017-04-29 DIAGNOSIS — D631 Anemia in chronic kidney disease: Secondary | ICD-10-CM | POA: Diagnosis not present

## 2017-04-29 DIAGNOSIS — I4891 Unspecified atrial fibrillation: Secondary | ICD-10-CM | POA: Diagnosis not present

## 2017-04-29 DIAGNOSIS — Z7901 Long term (current) use of anticoagulants: Secondary | ICD-10-CM

## 2017-04-29 DIAGNOSIS — Z23 Encounter for immunization: Secondary | ICD-10-CM | POA: Diagnosis not present

## 2017-04-29 DIAGNOSIS — N186 End stage renal disease: Secondary | ICD-10-CM | POA: Diagnosis not present

## 2017-04-29 DIAGNOSIS — I480 Paroxysmal atrial fibrillation: Secondary | ICD-10-CM

## 2017-04-29 DIAGNOSIS — Z4901 Encounter for fitting and adjustment of extracorporeal dialysis catheter: Secondary | ICD-10-CM | POA: Diagnosis not present

## 2017-04-29 LAB — PROTIME-INR: INR: 3 — AB (ref 0.9–1.1)

## 2017-04-30 ENCOUNTER — Telehealth: Payer: Self-pay | Admitting: Pharmacist Clinician (PhC)/ Clinical Pharmacy Specialist

## 2017-04-30 ENCOUNTER — Encounter: Payer: Self-pay | Admitting: Vascular Surgery

## 2017-04-30 NOTE — Telephone Encounter (Signed)
Last lab received on 12/13 was drawn 12/6 and resulted at 8.3.   We were unable to reach patient on 12/13.  Called dialysis center to determine why we weren't notified sooner of a critical INR.   Dialysis believed it may have been an error, but did not immediately call or re-draw for verification.   Patient was at dialysis yesterday (12/13) and had no bleeding/bruising.  Dialysis was told to call Friday as soon as INR from Thursday draw is resulted.  Called dialysis center Friday afternoon.  Results not in yet.  Advised nurse that they need to call results to [place] ASAP before 5 pm.  If results come in after 5pm and INR is > 4 they will need to call on call provider

## 2017-05-01 DIAGNOSIS — D631 Anemia in chronic kidney disease: Secondary | ICD-10-CM | POA: Diagnosis not present

## 2017-05-01 DIAGNOSIS — Z4901 Encounter for fitting and adjustment of extracorporeal dialysis catheter: Secondary | ICD-10-CM | POA: Diagnosis not present

## 2017-05-01 DIAGNOSIS — N186 End stage renal disease: Secondary | ICD-10-CM | POA: Diagnosis not present

## 2017-05-01 DIAGNOSIS — D689 Coagulation defect, unspecified: Secondary | ICD-10-CM | POA: Diagnosis not present

## 2017-05-01 DIAGNOSIS — E876 Hypokalemia: Secondary | ICD-10-CM | POA: Diagnosis not present

## 2017-05-01 DIAGNOSIS — I4891 Unspecified atrial fibrillation: Secondary | ICD-10-CM | POA: Diagnosis not present

## 2017-05-01 DIAGNOSIS — Z23 Encounter for immunization: Secondary | ICD-10-CM | POA: Diagnosis not present

## 2017-05-03 ENCOUNTER — Ambulatory Visit (INDEPENDENT_AMBULATORY_CARE_PROVIDER_SITE_OTHER): Payer: Medicare Other | Admitting: Pharmacist

## 2017-05-03 ENCOUNTER — Telehealth: Payer: Self-pay | Admitting: Family Medicine

## 2017-05-03 ENCOUNTER — Telehealth: Payer: Self-pay | Admitting: Cardiovascular Disease

## 2017-05-03 ENCOUNTER — Other Ambulatory Visit: Payer: Self-pay | Admitting: *Deleted

## 2017-05-03 DIAGNOSIS — N186 End stage renal disease: Secondary | ICD-10-CM | POA: Diagnosis not present

## 2017-05-03 DIAGNOSIS — I5043 Acute on chronic combined systolic (congestive) and diastolic (congestive) heart failure: Secondary | ICD-10-CM | POA: Diagnosis not present

## 2017-05-03 DIAGNOSIS — Z7901 Long term (current) use of anticoagulants: Secondary | ICD-10-CM

## 2017-05-03 DIAGNOSIS — H6123 Impacted cerumen, bilateral: Secondary | ICD-10-CM | POA: Diagnosis not present

## 2017-05-03 DIAGNOSIS — J9621 Acute and chronic respiratory failure with hypoxia: Secondary | ICD-10-CM | POA: Diagnosis not present

## 2017-05-03 DIAGNOSIS — I480 Paroxysmal atrial fibrillation: Secondary | ICD-10-CM

## 2017-05-03 MED ORDER — AMIODARONE HCL 200 MG PO TABS: 200.0000 mg | ORAL_TABLET | Freq: Every day | ORAL | 1 refills | Status: DC

## 2017-05-03 MED ORDER — VITAMIN D (ERGOCALCIFEROL) 1.25 MG (50000 UNIT) PO CAPS: 50000.0000 [IU] | ORAL_CAPSULE | ORAL | 3 refills | Status: DC

## 2017-05-03 NOTE — Telephone Encounter (Signed)
RF of vit D eRx'd

## 2017-05-03 NOTE — Telephone Encounter (Signed)
Pt advised and voiced understanding.   

## 2017-05-03 NOTE — Telephone Encounter (Signed)
Pt. Called and stated she has one Vita D 50,000 unit capsule left ; asking if she needs to have this refilled. Will send message to provider.  Pt. Agreed.

## 2017-05-03 NOTE — Telephone Encounter (Signed)
New message    *STAT* If patient is at the pharmacy, call can be transferred to refill team.   1. Which medications need to be refilled? (please list name of each medication and dose if known)       amiodarone (PACERONE) 200 MG tablet Take 1 tablet (200 mg total) by mouth daily.     2. Which pharmacy/location (including street and city if local pharmacy) is medication to be sent to? [facility]   3. Do they need a 30 day or 90 day supply?  [place]

## 2017-05-04 ENCOUNTER — Encounter: Payer: Self-pay | Admitting: Family Medicine

## 2017-05-04 DIAGNOSIS — D631 Anemia in chronic kidney disease: Secondary | ICD-10-CM | POA: Diagnosis not present

## 2017-05-04 DIAGNOSIS — Z4901 Encounter for fitting and adjustment of extracorporeal dialysis catheter: Secondary | ICD-10-CM | POA: Diagnosis not present

## 2017-05-04 DIAGNOSIS — E876 Hypokalemia: Secondary | ICD-10-CM | POA: Diagnosis not present

## 2017-05-04 DIAGNOSIS — N186 End stage renal disease: Secondary | ICD-10-CM | POA: Diagnosis not present

## 2017-05-04 DIAGNOSIS — Z23 Encounter for immunization: Secondary | ICD-10-CM | POA: Diagnosis not present

## 2017-05-04 DIAGNOSIS — D689 Coagulation defect, unspecified: Secondary | ICD-10-CM | POA: Diagnosis not present

## 2017-05-04 DIAGNOSIS — I4891 Unspecified atrial fibrillation: Secondary | ICD-10-CM | POA: Diagnosis not present

## 2017-05-05 ENCOUNTER — Ambulatory Visit (INDEPENDENT_AMBULATORY_CARE_PROVIDER_SITE_OTHER): Payer: Medicare Other | Admitting: Pharmacist

## 2017-05-05 DIAGNOSIS — Z7901 Long term (current) use of anticoagulants: Secondary | ICD-10-CM | POA: Diagnosis not present

## 2017-05-05 DIAGNOSIS — I255 Ischemic cardiomyopathy: Secondary | ICD-10-CM | POA: Diagnosis not present

## 2017-05-05 DIAGNOSIS — Z9981 Dependence on supplemental oxygen: Secondary | ICD-10-CM | POA: Diagnosis not present

## 2017-05-05 DIAGNOSIS — I480 Paroxysmal atrial fibrillation: Secondary | ICD-10-CM | POA: Diagnosis not present

## 2017-05-05 DIAGNOSIS — J9621 Acute and chronic respiratory failure with hypoxia: Secondary | ICD-10-CM | POA: Diagnosis not present

## 2017-05-05 DIAGNOSIS — Z950 Presence of cardiac pacemaker: Secondary | ICD-10-CM | POA: Diagnosis not present

## 2017-05-05 DIAGNOSIS — Z5181 Encounter for therapeutic drug level monitoring: Secondary | ICD-10-CM | POA: Diagnosis not present

## 2017-05-05 DIAGNOSIS — D638 Anemia in other chronic diseases classified elsewhere: Secondary | ICD-10-CM | POA: Diagnosis not present

## 2017-05-05 DIAGNOSIS — Z79891 Long term (current) use of opiate analgesic: Secondary | ICD-10-CM | POA: Diagnosis not present

## 2017-05-05 DIAGNOSIS — E785 Hyperlipidemia, unspecified: Secondary | ICD-10-CM | POA: Diagnosis not present

## 2017-05-05 DIAGNOSIS — I130 Hypertensive heart and chronic kidney disease with heart failure and stage 1 through stage 4 chronic kidney disease, or unspecified chronic kidney disease: Secondary | ICD-10-CM | POA: Diagnosis not present

## 2017-05-05 DIAGNOSIS — N186 End stage renal disease: Secondary | ICD-10-CM | POA: Diagnosis not present

## 2017-05-05 DIAGNOSIS — I5043 Acute on chronic combined systolic (congestive) and diastolic (congestive) heart failure: Secondary | ICD-10-CM | POA: Diagnosis not present

## 2017-05-05 DIAGNOSIS — I739 Peripheral vascular disease, unspecified: Secondary | ICD-10-CM | POA: Diagnosis not present

## 2017-05-05 LAB — POCT INR: INR: 1.3

## 2017-05-06 DIAGNOSIS — I4891 Unspecified atrial fibrillation: Secondary | ICD-10-CM | POA: Diagnosis not present

## 2017-05-06 DIAGNOSIS — D689 Coagulation defect, unspecified: Secondary | ICD-10-CM | POA: Diagnosis not present

## 2017-05-06 DIAGNOSIS — N186 End stage renal disease: Secondary | ICD-10-CM | POA: Diagnosis not present

## 2017-05-06 DIAGNOSIS — Z4901 Encounter for fitting and adjustment of extracorporeal dialysis catheter: Secondary | ICD-10-CM | POA: Diagnosis not present

## 2017-05-06 DIAGNOSIS — D631 Anemia in chronic kidney disease: Secondary | ICD-10-CM | POA: Diagnosis not present

## 2017-05-06 DIAGNOSIS — Z23 Encounter for immunization: Secondary | ICD-10-CM | POA: Diagnosis not present

## 2017-05-06 DIAGNOSIS — E876 Hypokalemia: Secondary | ICD-10-CM | POA: Diagnosis not present

## 2017-05-08 DIAGNOSIS — Z4901 Encounter for fitting and adjustment of extracorporeal dialysis catheter: Secondary | ICD-10-CM | POA: Diagnosis not present

## 2017-05-08 DIAGNOSIS — D689 Coagulation defect, unspecified: Secondary | ICD-10-CM | POA: Diagnosis not present

## 2017-05-08 DIAGNOSIS — Z23 Encounter for immunization: Secondary | ICD-10-CM | POA: Diagnosis not present

## 2017-05-08 DIAGNOSIS — N186 End stage renal disease: Secondary | ICD-10-CM | POA: Diagnosis not present

## 2017-05-08 DIAGNOSIS — E876 Hypokalemia: Secondary | ICD-10-CM | POA: Diagnosis not present

## 2017-05-08 DIAGNOSIS — I4891 Unspecified atrial fibrillation: Secondary | ICD-10-CM | POA: Diagnosis not present

## 2017-05-08 DIAGNOSIS — D631 Anemia in chronic kidney disease: Secondary | ICD-10-CM | POA: Diagnosis not present

## 2017-05-09 DIAGNOSIS — I5041 Acute combined systolic (congestive) and diastolic (congestive) heart failure: Secondary | ICD-10-CM | POA: Diagnosis not present

## 2017-05-10 DIAGNOSIS — D689 Coagulation defect, unspecified: Secondary | ICD-10-CM | POA: Diagnosis not present

## 2017-05-10 DIAGNOSIS — N186 End stage renal disease: Secondary | ICD-10-CM | POA: Diagnosis not present

## 2017-05-10 DIAGNOSIS — Z23 Encounter for immunization: Secondary | ICD-10-CM | POA: Diagnosis not present

## 2017-05-10 DIAGNOSIS — E876 Hypokalemia: Secondary | ICD-10-CM | POA: Diagnosis not present

## 2017-05-10 DIAGNOSIS — Z4901 Encounter for fitting and adjustment of extracorporeal dialysis catheter: Secondary | ICD-10-CM | POA: Diagnosis not present

## 2017-05-10 DIAGNOSIS — D631 Anemia in chronic kidney disease: Secondary | ICD-10-CM | POA: Diagnosis not present

## 2017-05-10 DIAGNOSIS — I4891 Unspecified atrial fibrillation: Secondary | ICD-10-CM | POA: Diagnosis not present

## 2017-05-12 ENCOUNTER — Ambulatory Visit (INDEPENDENT_AMBULATORY_CARE_PROVIDER_SITE_OTHER): Payer: Medicare Other | Admitting: Pharmacist Clinician (PhC)/ Clinical Pharmacy Specialist

## 2017-05-12 DIAGNOSIS — N186 End stage renal disease: Secondary | ICD-10-CM | POA: Diagnosis not present

## 2017-05-12 DIAGNOSIS — Z5181 Encounter for therapeutic drug level monitoring: Secondary | ICD-10-CM | POA: Diagnosis not present

## 2017-05-12 DIAGNOSIS — I480 Paroxysmal atrial fibrillation: Secondary | ICD-10-CM | POA: Diagnosis not present

## 2017-05-12 DIAGNOSIS — Z7901 Long term (current) use of anticoagulants: Secondary | ICD-10-CM

## 2017-05-12 DIAGNOSIS — Z9981 Dependence on supplemental oxygen: Secondary | ICD-10-CM | POA: Diagnosis not present

## 2017-05-12 DIAGNOSIS — E785 Hyperlipidemia, unspecified: Secondary | ICD-10-CM | POA: Diagnosis not present

## 2017-05-12 DIAGNOSIS — Z950 Presence of cardiac pacemaker: Secondary | ICD-10-CM | POA: Diagnosis not present

## 2017-05-12 DIAGNOSIS — I639 Cerebral infarction, unspecified: Secondary | ICD-10-CM

## 2017-05-12 DIAGNOSIS — I739 Peripheral vascular disease, unspecified: Secondary | ICD-10-CM | POA: Diagnosis not present

## 2017-05-12 DIAGNOSIS — J9621 Acute and chronic respiratory failure with hypoxia: Secondary | ICD-10-CM | POA: Diagnosis not present

## 2017-05-12 DIAGNOSIS — I255 Ischemic cardiomyopathy: Secondary | ICD-10-CM | POA: Diagnosis not present

## 2017-05-12 DIAGNOSIS — I130 Hypertensive heart and chronic kidney disease with heart failure and stage 1 through stage 4 chronic kidney disease, or unspecified chronic kidney disease: Secondary | ICD-10-CM | POA: Diagnosis not present

## 2017-05-12 DIAGNOSIS — D638 Anemia in other chronic diseases classified elsewhere: Secondary | ICD-10-CM | POA: Diagnosis not present

## 2017-05-12 DIAGNOSIS — I5043 Acute on chronic combined systolic (congestive) and diastolic (congestive) heart failure: Secondary | ICD-10-CM | POA: Diagnosis not present

## 2017-05-12 DIAGNOSIS — Z79891 Long term (current) use of opiate analgesic: Secondary | ICD-10-CM | POA: Diagnosis not present

## 2017-05-12 LAB — POCT INR: INR: 2.5

## 2017-05-13 DIAGNOSIS — Z23 Encounter for immunization: Secondary | ICD-10-CM | POA: Diagnosis not present

## 2017-05-13 DIAGNOSIS — D631 Anemia in chronic kidney disease: Secondary | ICD-10-CM | POA: Diagnosis not present

## 2017-05-13 DIAGNOSIS — Z4901 Encounter for fitting and adjustment of extracorporeal dialysis catheter: Secondary | ICD-10-CM | POA: Diagnosis not present

## 2017-05-13 DIAGNOSIS — N186 End stage renal disease: Secondary | ICD-10-CM | POA: Diagnosis not present

## 2017-05-13 DIAGNOSIS — E876 Hypokalemia: Secondary | ICD-10-CM | POA: Diagnosis not present

## 2017-05-13 DIAGNOSIS — I4891 Unspecified atrial fibrillation: Secondary | ICD-10-CM | POA: Diagnosis not present

## 2017-05-13 DIAGNOSIS — D689 Coagulation defect, unspecified: Secondary | ICD-10-CM | POA: Diagnosis not present

## 2017-05-15 DIAGNOSIS — D689 Coagulation defect, unspecified: Secondary | ICD-10-CM | POA: Diagnosis not present

## 2017-05-15 DIAGNOSIS — Z23 Encounter for immunization: Secondary | ICD-10-CM | POA: Diagnosis not present

## 2017-05-15 DIAGNOSIS — Z4901 Encounter for fitting and adjustment of extracorporeal dialysis catheter: Secondary | ICD-10-CM | POA: Diagnosis not present

## 2017-05-15 DIAGNOSIS — I4891 Unspecified atrial fibrillation: Secondary | ICD-10-CM | POA: Diagnosis not present

## 2017-05-15 DIAGNOSIS — E876 Hypokalemia: Secondary | ICD-10-CM | POA: Diagnosis not present

## 2017-05-15 DIAGNOSIS — N186 End stage renal disease: Secondary | ICD-10-CM | POA: Diagnosis not present

## 2017-05-15 DIAGNOSIS — D631 Anemia in chronic kidney disease: Secondary | ICD-10-CM | POA: Diagnosis not present

## 2017-05-17 DIAGNOSIS — D689 Coagulation defect, unspecified: Secondary | ICD-10-CM | POA: Diagnosis not present

## 2017-05-17 DIAGNOSIS — I4891 Unspecified atrial fibrillation: Secondary | ICD-10-CM | POA: Diagnosis not present

## 2017-05-17 DIAGNOSIS — Z23 Encounter for immunization: Secondary | ICD-10-CM | POA: Diagnosis not present

## 2017-05-17 DIAGNOSIS — N186 End stage renal disease: Secondary | ICD-10-CM | POA: Diagnosis not present

## 2017-05-17 DIAGNOSIS — Z992 Dependence on renal dialysis: Secondary | ICD-10-CM | POA: Diagnosis not present

## 2017-05-17 DIAGNOSIS — Z4901 Encounter for fitting and adjustment of extracorporeal dialysis catheter: Secondary | ICD-10-CM | POA: Diagnosis not present

## 2017-05-17 DIAGNOSIS — I120 Hypertensive chronic kidney disease with stage 5 chronic kidney disease or end stage renal disease: Secondary | ICD-10-CM | POA: Diagnosis not present

## 2017-05-17 DIAGNOSIS — E876 Hypokalemia: Secondary | ICD-10-CM | POA: Diagnosis not present

## 2017-05-17 DIAGNOSIS — D631 Anemia in chronic kidney disease: Secondary | ICD-10-CM | POA: Diagnosis not present

## 2017-05-18 DIAGNOSIS — M48061 Spinal stenosis, lumbar region without neurogenic claudication: Secondary | ICD-10-CM

## 2017-05-18 HISTORY — DX: Spinal stenosis, lumbar region without neurogenic claudication: M48.061

## 2017-05-19 ENCOUNTER — Ambulatory Visit (INDEPENDENT_AMBULATORY_CARE_PROVIDER_SITE_OTHER): Payer: Medicare Other | Admitting: Pharmacist Clinician (PhC)/ Clinical Pharmacy Specialist

## 2017-05-19 DIAGNOSIS — D638 Anemia in other chronic diseases classified elsewhere: Secondary | ICD-10-CM | POA: Diagnosis not present

## 2017-05-19 DIAGNOSIS — I5043 Acute on chronic combined systolic (congestive) and diastolic (congestive) heart failure: Secondary | ICD-10-CM | POA: Diagnosis not present

## 2017-05-19 DIAGNOSIS — E785 Hyperlipidemia, unspecified: Secondary | ICD-10-CM | POA: Diagnosis not present

## 2017-05-19 DIAGNOSIS — I255 Ischemic cardiomyopathy: Secondary | ICD-10-CM | POA: Diagnosis not present

## 2017-05-19 DIAGNOSIS — I480 Paroxysmal atrial fibrillation: Secondary | ICD-10-CM | POA: Diagnosis not present

## 2017-05-19 DIAGNOSIS — Z5181 Encounter for therapeutic drug level monitoring: Secondary | ICD-10-CM | POA: Diagnosis not present

## 2017-05-19 DIAGNOSIS — Z9981 Dependence on supplemental oxygen: Secondary | ICD-10-CM | POA: Diagnosis not present

## 2017-05-19 DIAGNOSIS — Z7901 Long term (current) use of anticoagulants: Secondary | ICD-10-CM

## 2017-05-19 DIAGNOSIS — Z950 Presence of cardiac pacemaker: Secondary | ICD-10-CM | POA: Diagnosis not present

## 2017-05-19 DIAGNOSIS — I739 Peripheral vascular disease, unspecified: Secondary | ICD-10-CM | POA: Diagnosis not present

## 2017-05-19 DIAGNOSIS — J9621 Acute and chronic respiratory failure with hypoxia: Secondary | ICD-10-CM | POA: Diagnosis not present

## 2017-05-19 DIAGNOSIS — Z79891 Long term (current) use of opiate analgesic: Secondary | ICD-10-CM | POA: Diagnosis not present

## 2017-05-19 DIAGNOSIS — N186 End stage renal disease: Secondary | ICD-10-CM | POA: Diagnosis not present

## 2017-05-19 DIAGNOSIS — I130 Hypertensive heart and chronic kidney disease with heart failure and stage 1 through stage 4 chronic kidney disease, or unspecified chronic kidney disease: Secondary | ICD-10-CM | POA: Diagnosis not present

## 2017-05-19 LAB — POCT INR: INR: 2.5

## 2017-05-20 DIAGNOSIS — Z23 Encounter for immunization: Secondary | ICD-10-CM | POA: Diagnosis not present

## 2017-05-20 DIAGNOSIS — N186 End stage renal disease: Secondary | ICD-10-CM | POA: Diagnosis not present

## 2017-05-20 DIAGNOSIS — Z4901 Encounter for fitting and adjustment of extracorporeal dialysis catheter: Secondary | ICD-10-CM | POA: Diagnosis not present

## 2017-05-20 DIAGNOSIS — I4891 Unspecified atrial fibrillation: Secondary | ICD-10-CM | POA: Diagnosis not present

## 2017-05-20 DIAGNOSIS — E876 Hypokalemia: Secondary | ICD-10-CM | POA: Diagnosis not present

## 2017-05-20 DIAGNOSIS — D689 Coagulation defect, unspecified: Secondary | ICD-10-CM | POA: Diagnosis not present

## 2017-05-21 DIAGNOSIS — R5381 Other malaise: Secondary | ICD-10-CM | POA: Diagnosis not present

## 2017-05-22 DIAGNOSIS — D689 Coagulation defect, unspecified: Secondary | ICD-10-CM | POA: Diagnosis not present

## 2017-05-22 DIAGNOSIS — E876 Hypokalemia: Secondary | ICD-10-CM | POA: Diagnosis not present

## 2017-05-22 DIAGNOSIS — N186 End stage renal disease: Secondary | ICD-10-CM | POA: Diagnosis not present

## 2017-05-22 DIAGNOSIS — Z4901 Encounter for fitting and adjustment of extracorporeal dialysis catheter: Secondary | ICD-10-CM | POA: Diagnosis not present

## 2017-05-22 DIAGNOSIS — Z23 Encounter for immunization: Secondary | ICD-10-CM | POA: Diagnosis not present

## 2017-05-22 DIAGNOSIS — I4891 Unspecified atrial fibrillation: Secondary | ICD-10-CM | POA: Diagnosis not present

## 2017-05-25 DIAGNOSIS — D689 Coagulation defect, unspecified: Secondary | ICD-10-CM | POA: Diagnosis not present

## 2017-05-25 DIAGNOSIS — E876 Hypokalemia: Secondary | ICD-10-CM | POA: Diagnosis not present

## 2017-05-25 DIAGNOSIS — N186 End stage renal disease: Secondary | ICD-10-CM | POA: Diagnosis not present

## 2017-05-25 DIAGNOSIS — I4891 Unspecified atrial fibrillation: Secondary | ICD-10-CM | POA: Diagnosis not present

## 2017-05-25 DIAGNOSIS — Z4901 Encounter for fitting and adjustment of extracorporeal dialysis catheter: Secondary | ICD-10-CM | POA: Diagnosis not present

## 2017-05-25 DIAGNOSIS — Z23 Encounter for immunization: Secondary | ICD-10-CM | POA: Diagnosis not present

## 2017-05-27 ENCOUNTER — Ambulatory Visit (INDEPENDENT_AMBULATORY_CARE_PROVIDER_SITE_OTHER): Payer: Medicare Other | Admitting: Pharmacist Clinician (PhC)/ Clinical Pharmacy Specialist

## 2017-05-27 DIAGNOSIS — I255 Ischemic cardiomyopathy: Secondary | ICD-10-CM | POA: Diagnosis not present

## 2017-05-27 DIAGNOSIS — I739 Peripheral vascular disease, unspecified: Secondary | ICD-10-CM | POA: Diagnosis not present

## 2017-05-27 DIAGNOSIS — N186 End stage renal disease: Secondary | ICD-10-CM | POA: Diagnosis not present

## 2017-05-27 DIAGNOSIS — Z4901 Encounter for fitting and adjustment of extracorporeal dialysis catheter: Secondary | ICD-10-CM | POA: Diagnosis not present

## 2017-05-27 DIAGNOSIS — I480 Paroxysmal atrial fibrillation: Secondary | ICD-10-CM

## 2017-05-27 DIAGNOSIS — Z23 Encounter for immunization: Secondary | ICD-10-CM | POA: Diagnosis not present

## 2017-05-27 DIAGNOSIS — I5043 Acute on chronic combined systolic (congestive) and diastolic (congestive) heart failure: Secondary | ICD-10-CM | POA: Diagnosis not present

## 2017-05-27 DIAGNOSIS — E876 Hypokalemia: Secondary | ICD-10-CM | POA: Diagnosis not present

## 2017-05-27 DIAGNOSIS — E785 Hyperlipidemia, unspecified: Secondary | ICD-10-CM | POA: Diagnosis not present

## 2017-05-27 DIAGNOSIS — D638 Anemia in other chronic diseases classified elsewhere: Secondary | ICD-10-CM | POA: Diagnosis not present

## 2017-05-27 DIAGNOSIS — Z950 Presence of cardiac pacemaker: Secondary | ICD-10-CM | POA: Diagnosis not present

## 2017-05-27 DIAGNOSIS — I4891 Unspecified atrial fibrillation: Secondary | ICD-10-CM | POA: Diagnosis not present

## 2017-05-27 DIAGNOSIS — Z5181 Encounter for therapeutic drug level monitoring: Secondary | ICD-10-CM | POA: Diagnosis not present

## 2017-05-27 DIAGNOSIS — Z7901 Long term (current) use of anticoagulants: Secondary | ICD-10-CM | POA: Diagnosis not present

## 2017-05-27 DIAGNOSIS — Z79891 Long term (current) use of opiate analgesic: Secondary | ICD-10-CM | POA: Diagnosis not present

## 2017-05-27 DIAGNOSIS — J9621 Acute and chronic respiratory failure with hypoxia: Secondary | ICD-10-CM | POA: Diagnosis not present

## 2017-05-27 DIAGNOSIS — D689 Coagulation defect, unspecified: Secondary | ICD-10-CM | POA: Diagnosis not present

## 2017-05-27 DIAGNOSIS — Z9981 Dependence on supplemental oxygen: Secondary | ICD-10-CM | POA: Diagnosis not present

## 2017-05-27 DIAGNOSIS — I130 Hypertensive heart and chronic kidney disease with heart failure and stage 1 through stage 4 chronic kidney disease, or unspecified chronic kidney disease: Secondary | ICD-10-CM | POA: Diagnosis not present

## 2017-05-27 LAB — POCT INR: INR: 3.3

## 2017-05-28 ENCOUNTER — Ambulatory Visit (INDEPENDENT_AMBULATORY_CARE_PROVIDER_SITE_OTHER): Payer: Medicare Other | Admitting: Family Medicine

## 2017-05-28 ENCOUNTER — Encounter: Payer: Self-pay | Admitting: Family Medicine

## 2017-05-28 VITALS — BP 158/67 | HR 66 | Temp 97.4°F | Resp 16 | Ht 59.0 in | Wt 106.8 lb

## 2017-05-28 DIAGNOSIS — Z8781 Personal history of (healed) traumatic fracture: Secondary | ICD-10-CM

## 2017-05-28 DIAGNOSIS — M545 Low back pain, unspecified: Secondary | ICD-10-CM

## 2017-05-28 DIAGNOSIS — G8929 Other chronic pain: Secondary | ICD-10-CM

## 2017-05-28 DIAGNOSIS — M546 Pain in thoracic spine: Secondary | ICD-10-CM

## 2017-05-28 DIAGNOSIS — R05 Cough: Secondary | ICD-10-CM | POA: Diagnosis not present

## 2017-05-28 DIAGNOSIS — R059 Cough, unspecified: Secondary | ICD-10-CM

## 2017-05-28 DIAGNOSIS — J0100 Acute maxillary sinusitis, unspecified: Secondary | ICD-10-CM | POA: Diagnosis not present

## 2017-05-28 MED ORDER — IPRATROPIUM BROMIDE 0.03 % NA SOLN: 2.0000 | Freq: Two times a day (BID) | NASAL | 3 refills | Status: DC

## 2017-05-28 MED ORDER — CEPHALEXIN 250 MG PO CAPS: ORAL_CAPSULE | ORAL | 0 refills | Status: DC

## 2017-05-28 NOTE — Progress Notes (Signed)
OFFICE VISIT  05/28/2017   CC:  Chief Complaint  Patient presents with  . Cough  . Back Pain     HPI:    Patient is a 84 y.o. Caucasian female who presents for cough, also back pain. Pt with ESRD on HD T/Th/Sat. Also with PAF on coumadin, chronic combined systolic and diastolic HF--chronic hypoxic resp failure. Hx of vertebral compression fx's--got kyphoplasty. She is UTD on flu and pneumonia vaccines.  Onset 4 weeks ago, runny nose, cough, feeling some wheezing.  NO fever.  No ST or HA.  Cough is productive.  Takes tussin DM--no help.  No face pain.  Pt describes "double sickening". Some feeling of fatigue from time to time.  Some dizziness after HD.  No body aches.  No n/v or malaise.  Back hurting a lot. Diffusely in lower T and upper L spine region, kind of roving back and forth but no radiation into glut regions or down legs.   Most recent attempt to see Dr. Nelva Bush she waited 2 hours and walked out before being seen.   Taking percocet but only a few per week.  When at rest not much problem w/back but lots of pain with activity.  Past Medical History:  Diagnosis Date  . Anemia of chronic disease 2018   + anemia of CRI?  [name] Atrophy of left kidney    with absent blood flow by renal artery dopplers (Dr. Gwenlyn Found)  . 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 Chronic combined systolic and diastolic CHF (congestive heart failure) ([place])    a. 11/2016: echo showing EF of 35-40%, RV strain noted, mild MR and mild TR.   [name] Chronic hypoxemic respiratory failure ([place])   . Debilitated patient    WC dependent as of 2018  . Dysrhythmia    afib  . ESRD on hemodialysis ([place]) 01/2017   T/Th/Sat schedule- [place].  Right basilic AV fistula 05/930.  . Family history of adverse reaction to anesthesia    Daughter- nausea  . History of adenomatous polyp of colon   . History of kidney stones    passed  . History of subarachnoid hemorrhage 10/2014   after syncope and while on  xarelto  . HOH (hard of hearing)   . Hyperlipidemia   . Hypertension    Difficult to control, in the setting of one functioning kidney: pt was referred to nephrology by Dr. Gwenlyn Found 06/2015.  . Lumbar radiculopathy 2012  . Lumbar spondylosis    MR 07/2016---no sign of spinal nerve compression or cord compression.  Pt set up with outpt ortho while admitted to hosp 07/2016.  . Malnourished ([place])   . Metatarsal fracture 06/10/2016   Nondisplaced, left 5th metatarsal--pt was referred to ortho  . Nonischemic cardiomyopathy ([place])   . Osteopenia 2014   T-score -2.1  . PAF (paroxysmal atrial fibrillation) ([facility])    Eliquis started after BRAO and CVA.  She was changed to warfarin 2018--INR/coumadin mgmt per HD/nephrol.  . Presence of permanent cardiac pacemaker   . Pulmonary edema 11/2016  . Right rib fracture 12/2016   s/p fall  . Sick sinus syndrome ([place])    Dual chamber pacer insertion 2018 (Dr. Lovena Le)  . Stroke [facility])    cardioembolic (had CVA while on no anticoag)--"scattered subacute punctate infarcts: 1 in R parietal lobe and 2 in occipital cortex" on MRI br.  CT angio head/neck: aortic arch athero.   . Thoracic back pain 01/2017   [place]?  Dr. Nelva Bush to do steroid injection as of 02/2017  . Thoracic compression fracture ([place]) 07/2016   T7 and T8-- T8 kyphoplasty during hosp admission 07/2016.  Neuro referred pt to pain mgmt for consideration of injection 09/2016.  I referred her to endo 08/2016 for consideration of calcitonin treatment.    Past Surgical History:  Procedure Laterality Date  . APPENDECTOMY  child  . AV FISTULA PLACEMENT Right 03/31/2017   Procedure: Right ARM Basilic vein transposition;  Surgeon: Conrad [place], MD;  Location: [facility];  Service: Vascular;  Laterality: Right;  . CARDIOVASCULAR STRESS TEST  01/31/2016   Stress myoview: NORMAL/Low risk.  EF 56%.  . [place]  . COLONOSCOPY  2015   + hx of adenomatous polyps.  Need digest health spec in [facility] [place] records  to see when pt due for next colonoscopy  . EYE SURGERY Bilateral    Catarct  . INSERTION OF DIALYSIS CATHETER Right 01/29/2017   Procedure: INSERTION OF DIALYSIS CATHETER- RIGHT INTERNAL JUGULAR;  Surgeon: [name], MD;  Location: [facility] OR;  Service: Vascular;  Laterality: Right;  . IR GENERIC HISTORICAL  07/24/2016   IR KYPHO THORACIC WITH BONE BIOPSY 07/24/2016 Luanne Bras, MD [facility]-INTERV RAD  . KYPHOPLASTY  07/27/2016   T8  . PACEMAKER IMPLANT N/A 12/03/2016   Procedure: Pacemaker Implant;  Surgeon: Evans Lance, MD;  Location: [facility];  Service: Cardiovascular;  Laterality: N/A;  . Renal artery dopplers  02/26/2016   Her right renal dimension was 11 cm pole to pole with mild to moderate right renal artery stenosis. A right renal aortic ratio was 3.22 suggesting less than a 50% stenosis.  . TONSILLECTOMY    . TRANSTHORACIC ECHOCARDIOGRAM  01/29/2016; 11/2016   2017: EF 55-60%, normal LV wall motion, grade I DD.  No cardiac source of emboli was seen.  2018: EF 35-40%, could not assess DD due to a-fib, pulm HTN noted.    Outpatient Medications Prior to Visit  Medication Sig Dispense Refill  . acetaminophen (TYLENOL) 500 MG tablet Take 500-1,000 mg 3 (three) times daily as needed by mouth for moderate pain.     [name] amiodarone (PACERONE) 200 MG tablet Take 1 tablet (200 mg total) by mouth daily. 90 tablet 1  . carvedilol (COREG) 25 MG tablet Take 1 tablet (25 mg total) 2 (two) times daily with a meal by mouth. 180 tablet 1  . isosorbide mononitrate (IMDUR) 60 MG 24 hr tablet Take 1 tablet (60 mg total) daily by mouth. 90 tablet 1  . Multiple Vitamin (MULTIVITAMIN) tablet Take 1 tablet by mouth 3 (three) times a week.     . ondansetron (ZOFRAN) 4 MG tablet Take 0.5-1 tablets (2-4 mg total) by mouth every 8 (eight) hours as needed for nausea or vomiting. 90 tablet 3  . oxyCODONE-acetaminophen (PERCOCET/ROXICET) 5-325 MG tablet Take 1-2 tablets every 6 (six) hours as needed by mouth  for severe pain. 8 tablet 0  . polyethylene glycol (MIRALAX / GLYCOLAX) packet Take 17 g by mouth daily as needed. (Patient taking differently: Take 17 g daily as needed by mouth for mild constipation. ) 10 each 0  . pravastatin (PRAVACHOL) 40 MG tablet Take 1 tablet (40 mg total) every evening by mouth. 90 tablet 1  . sevelamer carbonate (RENVELA) 800 MG tablet Take 800 mg by mouth 2 (two) times daily with a meal.    . vitamin C (ASCORBIC ACID) 500 MG tablet Take 500 mg by mouth  daily.    . Vitamin D, Ergocalciferol, (DRISDOL) 50000 units CAPS capsule Take 1 capsule (50,000 Units total) by mouth every 7 (seven) days. 12 capsule 3  . warfarin (COUMADIN) 3 MG tablet Take 1/2 to 1 tablets daily as directed by coumadin clinic 90 tablet 1   No facility-administered medications prior to visit.     Allergies  Allergen Reactions  . Clonidine Derivatives Palpitations and Other (See Comments)    VERY SEDATED  . Codeine Nausea Only  . Nickel Rash  . Sulfa Antibiotics Other (See Comments)    Reaction unknown    ROS As per HPI  PE: Blood pressure (!) 158/67, pulse 66, temperature (!) 97.4 F (36.3 C), temperature source Oral, resp. rate 16, height 4\' 11"  (1.499 m), weight 106 lb 12 oz (48.4 kg), SpO2 95 %. Gen: Alert, well appearing.  Patient is oriented to person, place, time, and situation. AFFECT: pleasant, lucid thought and speech. HEENT: eyes without injection, drainage, or swelling.  Ears: EACs clear, TMs with normal light reflex and landmarks.  Nose: Clear rhinorrhea, with some dried, crusty exudate adherent to mildly injected mucosa.  No purulent d/c.  No paranasal sinus TTP.  No facial swelling.  Throat and mouth without focal lesion.  No pharyngial swelling, erythema, or exudate.   Neck: supple, no LAD.   LUNGS: CTA bilat, nonlabored resps.   CV: RRR   LABS:    Chemistry      Component Value Date/Time   NA 135 03/31/2017 0643   NA 137 09/01/2016   K 4.0 03/31/2017 0643   CL 92  (L) 02/18/2017 0800   CO2 22 02/18/2017 0800   BUN 38 (H) 02/18/2017 0800   BUN 51 (A) 09/01/2016   CREATININE 4.08 (H) 02/18/2017 0800   CREATININE 1.63 (H) 03/24/2016 0110   GLU 128 09/01/2016      Component Value Date/Time   CALCIUM 8.7 (L) 02/18/2017 0800   CALCIUM 9.5 02/16/2017 0232   ALKPHOS 89 02/13/2017 1357   AST 34 02/13/2017 1357   ALT 23 02/13/2017 1357   BILITOT 0.6 02/13/2017 1357   BILITOT 0.2 06/17/2016 0819      IMPRESSION AND PLAN:  1) Prolonged URI, possible acute bacterial sinusitis. Keflex 250mg  bid x 10d. Ipratropium nasal spray rx'd.  2) Cough: acute bronchitis (w/out RAD) vs PND cough. I do not feel a trial of prednisone is indicated at this time. She does NOT appear fluid overloaded.  3) Chronic lower thoracic and upper lumbar back pain: she is in a lot of pain but DOES NOT rely on percocet much at all.  She was seeing Dr. Nelva Bush, but asks for referral to new pain MD b/c of the extremely long wait times/double booking that Dr. Jeralyn Ruths office does---per pt report. I made new referral today to pain mgmt clinic, [facility] pain mgmt first choice.  An After Visit Summary was printed and given to the patient.  FOLLOW UP: Return if symptoms worsen or fail to improve in 7-10d.  Signed:  Crissie Sickles, MD           05/28/2017

## 2017-05-29 DIAGNOSIS — Z23 Encounter for immunization: Secondary | ICD-10-CM | POA: Diagnosis not present

## 2017-05-29 DIAGNOSIS — E876 Hypokalemia: Secondary | ICD-10-CM | POA: Diagnosis not present

## 2017-05-29 DIAGNOSIS — D689 Coagulation defect, unspecified: Secondary | ICD-10-CM | POA: Diagnosis not present

## 2017-05-29 DIAGNOSIS — I4891 Unspecified atrial fibrillation: Secondary | ICD-10-CM | POA: Diagnosis not present

## 2017-05-29 DIAGNOSIS — N186 End stage renal disease: Secondary | ICD-10-CM | POA: Diagnosis not present

## 2017-05-29 DIAGNOSIS — Z4901 Encounter for fitting and adjustment of extracorporeal dialysis catheter: Secondary | ICD-10-CM | POA: Diagnosis not present

## 2017-06-01 DIAGNOSIS — I4891 Unspecified atrial fibrillation: Secondary | ICD-10-CM | POA: Diagnosis not present

## 2017-06-01 DIAGNOSIS — Z23 Encounter for immunization: Secondary | ICD-10-CM | POA: Diagnosis not present

## 2017-06-01 DIAGNOSIS — E876 Hypokalemia: Secondary | ICD-10-CM | POA: Diagnosis not present

## 2017-06-01 DIAGNOSIS — N186 End stage renal disease: Secondary | ICD-10-CM | POA: Diagnosis not present

## 2017-06-01 DIAGNOSIS — Z4901 Encounter for fitting and adjustment of extracorporeal dialysis catheter: Secondary | ICD-10-CM | POA: Diagnosis not present

## 2017-06-01 DIAGNOSIS — D689 Coagulation defect, unspecified: Secondary | ICD-10-CM | POA: Diagnosis not present

## 2017-06-02 ENCOUNTER — Ambulatory Visit (INDEPENDENT_AMBULATORY_CARE_PROVIDER_SITE_OTHER): Payer: Medicare Other | Admitting: Pharmacist Clinician (PhC)/ Clinical Pharmacy Specialist

## 2017-06-02 DIAGNOSIS — Z5181 Encounter for therapeutic drug level monitoring: Secondary | ICD-10-CM | POA: Diagnosis not present

## 2017-06-02 DIAGNOSIS — E785 Hyperlipidemia, unspecified: Secondary | ICD-10-CM | POA: Diagnosis not present

## 2017-06-02 DIAGNOSIS — Z79891 Long term (current) use of opiate analgesic: Secondary | ICD-10-CM | POA: Diagnosis not present

## 2017-06-02 DIAGNOSIS — N186 End stage renal disease: Secondary | ICD-10-CM | POA: Diagnosis not present

## 2017-06-02 DIAGNOSIS — D638 Anemia in other chronic diseases classified elsewhere: Secondary | ICD-10-CM | POA: Diagnosis not present

## 2017-06-02 DIAGNOSIS — I739 Peripheral vascular disease, unspecified: Secondary | ICD-10-CM | POA: Diagnosis not present

## 2017-06-02 DIAGNOSIS — Z9981 Dependence on supplemental oxygen: Secondary | ICD-10-CM | POA: Diagnosis not present

## 2017-06-02 DIAGNOSIS — Z950 Presence of cardiac pacemaker: Secondary | ICD-10-CM | POA: Diagnosis not present

## 2017-06-02 DIAGNOSIS — Z7901 Long term (current) use of anticoagulants: Secondary | ICD-10-CM | POA: Diagnosis not present

## 2017-06-02 DIAGNOSIS — J9621 Acute and chronic respiratory failure with hypoxia: Secondary | ICD-10-CM | POA: Diagnosis not present

## 2017-06-02 DIAGNOSIS — I480 Paroxysmal atrial fibrillation: Secondary | ICD-10-CM | POA: Diagnosis not present

## 2017-06-02 DIAGNOSIS — I255 Ischemic cardiomyopathy: Secondary | ICD-10-CM | POA: Diagnosis not present

## 2017-06-02 DIAGNOSIS — I5043 Acute on chronic combined systolic (congestive) and diastolic (congestive) heart failure: Secondary | ICD-10-CM | POA: Diagnosis not present

## 2017-06-02 DIAGNOSIS — I130 Hypertensive heart and chronic kidney disease with heart failure and stage 1 through stage 4 chronic kidney disease, or unspecified chronic kidney disease: Secondary | ICD-10-CM | POA: Diagnosis not present

## 2017-06-02 LAB — POCT INR: INR: 3.1

## 2017-06-02 LAB — PROTIME-INR

## 2017-06-03 DIAGNOSIS — E876 Hypokalemia: Secondary | ICD-10-CM | POA: Diagnosis not present

## 2017-06-03 DIAGNOSIS — I4891 Unspecified atrial fibrillation: Secondary | ICD-10-CM | POA: Diagnosis not present

## 2017-06-03 DIAGNOSIS — Z23 Encounter for immunization: Secondary | ICD-10-CM | POA: Diagnosis not present

## 2017-06-03 DIAGNOSIS — D689 Coagulation defect, unspecified: Secondary | ICD-10-CM | POA: Diagnosis not present

## 2017-06-03 DIAGNOSIS — N186 End stage renal disease: Secondary | ICD-10-CM | POA: Diagnosis not present

## 2017-06-03 DIAGNOSIS — Z4901 Encounter for fitting and adjustment of extracorporeal dialysis catheter: Secondary | ICD-10-CM | POA: Diagnosis not present

## 2017-06-05 DIAGNOSIS — Z23 Encounter for immunization: Secondary | ICD-10-CM | POA: Diagnosis not present

## 2017-06-05 DIAGNOSIS — D689 Coagulation defect, unspecified: Secondary | ICD-10-CM | POA: Diagnosis not present

## 2017-06-05 DIAGNOSIS — Z4901 Encounter for fitting and adjustment of extracorporeal dialysis catheter: Secondary | ICD-10-CM | POA: Diagnosis not present

## 2017-06-05 DIAGNOSIS — I4891 Unspecified atrial fibrillation: Secondary | ICD-10-CM | POA: Diagnosis not present

## 2017-06-05 DIAGNOSIS — E876 Hypokalemia: Secondary | ICD-10-CM | POA: Diagnosis not present

## 2017-06-05 DIAGNOSIS — N186 End stage renal disease: Secondary | ICD-10-CM | POA: Diagnosis not present

## 2017-06-08 DIAGNOSIS — E876 Hypokalemia: Secondary | ICD-10-CM | POA: Diagnosis not present

## 2017-06-08 DIAGNOSIS — Z23 Encounter for immunization: Secondary | ICD-10-CM | POA: Diagnosis not present

## 2017-06-08 DIAGNOSIS — Z4901 Encounter for fitting and adjustment of extracorporeal dialysis catheter: Secondary | ICD-10-CM | POA: Diagnosis not present

## 2017-06-08 DIAGNOSIS — I4891 Unspecified atrial fibrillation: Secondary | ICD-10-CM | POA: Diagnosis not present

## 2017-06-08 DIAGNOSIS — D689 Coagulation defect, unspecified: Secondary | ICD-10-CM | POA: Diagnosis not present

## 2017-06-08 DIAGNOSIS — N186 End stage renal disease: Secondary | ICD-10-CM | POA: Diagnosis not present

## 2017-06-08 LAB — PROTIME-INR: INR: 6.3 — AB (ref 0.9–1.1)

## 2017-06-09 ENCOUNTER — Ambulatory Visit (INDEPENDENT_AMBULATORY_CARE_PROVIDER_SITE_OTHER): Payer: Medicare Other | Admitting: *Deleted

## 2017-06-09 DIAGNOSIS — I495 Sick sinus syndrome: Secondary | ICD-10-CM | POA: Diagnosis not present

## 2017-06-09 DIAGNOSIS — I5041 Acute combined systolic (congestive) and diastolic (congestive) heart failure: Secondary | ICD-10-CM | POA: Diagnosis not present

## 2017-06-09 NOTE — Progress Notes (Signed)
Remote pacemaker transmission.   

## 2017-06-10 ENCOUNTER — Ambulatory Visit (INDEPENDENT_AMBULATORY_CARE_PROVIDER_SITE_OTHER): Payer: Medicare Other | Admitting: Pharmacist

## 2017-06-10 ENCOUNTER — Encounter: Payer: Self-pay | Admitting: Cardiology

## 2017-06-10 DIAGNOSIS — Z7901 Long term (current) use of anticoagulants: Secondary | ICD-10-CM

## 2017-06-10 DIAGNOSIS — I480 Paroxysmal atrial fibrillation: Secondary | ICD-10-CM | POA: Diagnosis not present

## 2017-06-10 DIAGNOSIS — Z4901 Encounter for fitting and adjustment of extracorporeal dialysis catheter: Secondary | ICD-10-CM | POA: Diagnosis not present

## 2017-06-10 DIAGNOSIS — E876 Hypokalemia: Secondary | ICD-10-CM | POA: Diagnosis not present

## 2017-06-10 DIAGNOSIS — Z23 Encounter for immunization: Secondary | ICD-10-CM | POA: Diagnosis not present

## 2017-06-10 DIAGNOSIS — I4891 Unspecified atrial fibrillation: Secondary | ICD-10-CM | POA: Diagnosis not present

## 2017-06-10 DIAGNOSIS — D689 Coagulation defect, unspecified: Secondary | ICD-10-CM | POA: Diagnosis not present

## 2017-06-10 DIAGNOSIS — N186 End stage renal disease: Secondary | ICD-10-CM | POA: Diagnosis not present

## 2017-06-10 LAB — CBC AND DIFFERENTIAL: Platelets: 239 (ref 150–399)

## 2017-06-10 LAB — IRON,TIBC AND FERRITIN PANEL: Ferritin: 516

## 2017-06-12 DIAGNOSIS — Z4901 Encounter for fitting and adjustment of extracorporeal dialysis catheter: Secondary | ICD-10-CM | POA: Diagnosis not present

## 2017-06-12 DIAGNOSIS — N186 End stage renal disease: Secondary | ICD-10-CM | POA: Diagnosis not present

## 2017-06-12 DIAGNOSIS — E876 Hypokalemia: Secondary | ICD-10-CM | POA: Diagnosis not present

## 2017-06-12 DIAGNOSIS — Z23 Encounter for immunization: Secondary | ICD-10-CM | POA: Diagnosis not present

## 2017-06-12 DIAGNOSIS — I4891 Unspecified atrial fibrillation: Secondary | ICD-10-CM | POA: Diagnosis not present

## 2017-06-12 DIAGNOSIS — D689 Coagulation defect, unspecified: Secondary | ICD-10-CM | POA: Diagnosis not present

## 2017-06-15 DIAGNOSIS — D689 Coagulation defect, unspecified: Secondary | ICD-10-CM | POA: Diagnosis not present

## 2017-06-15 DIAGNOSIS — I4891 Unspecified atrial fibrillation: Secondary | ICD-10-CM | POA: Diagnosis not present

## 2017-06-15 DIAGNOSIS — E876 Hypokalemia: Secondary | ICD-10-CM | POA: Diagnosis not present

## 2017-06-15 DIAGNOSIS — Z4901 Encounter for fitting and adjustment of extracorporeal dialysis catheter: Secondary | ICD-10-CM | POA: Diagnosis not present

## 2017-06-15 DIAGNOSIS — N186 End stage renal disease: Secondary | ICD-10-CM | POA: Diagnosis not present

## 2017-06-15 DIAGNOSIS — Z23 Encounter for immunization: Secondary | ICD-10-CM | POA: Diagnosis not present

## 2017-06-17 DIAGNOSIS — I4891 Unspecified atrial fibrillation: Secondary | ICD-10-CM | POA: Diagnosis not present

## 2017-06-17 DIAGNOSIS — Z4901 Encounter for fitting and adjustment of extracorporeal dialysis catheter: Secondary | ICD-10-CM | POA: Diagnosis not present

## 2017-06-17 DIAGNOSIS — D689 Coagulation defect, unspecified: Secondary | ICD-10-CM | POA: Diagnosis not present

## 2017-06-17 DIAGNOSIS — Z23 Encounter for immunization: Secondary | ICD-10-CM | POA: Diagnosis not present

## 2017-06-17 DIAGNOSIS — E876 Hypokalemia: Secondary | ICD-10-CM | POA: Diagnosis not present

## 2017-06-17 DIAGNOSIS — I120 Hypertensive chronic kidney disease with stage 5 chronic kidney disease or end stage renal disease: Secondary | ICD-10-CM | POA: Diagnosis not present

## 2017-06-17 DIAGNOSIS — Z992 Dependence on renal dialysis: Secondary | ICD-10-CM | POA: Diagnosis not present

## 2017-06-17 DIAGNOSIS — N186 End stage renal disease: Secondary | ICD-10-CM | POA: Diagnosis not present

## 2017-06-18 ENCOUNTER — Ambulatory Visit (INDEPENDENT_AMBULATORY_CARE_PROVIDER_SITE_OTHER): Payer: Medicare Other | Admitting: Pharmacist Clinician (PhC)/ Clinical Pharmacy Specialist

## 2017-06-18 DIAGNOSIS — N186 End stage renal disease: Secondary | ICD-10-CM | POA: Diagnosis not present

## 2017-06-18 DIAGNOSIS — Z992 Dependence on renal dialysis: Secondary | ICD-10-CM | POA: Diagnosis not present

## 2017-06-18 DIAGNOSIS — I120 Hypertensive chronic kidney disease with stage 5 chronic kidney disease or end stage renal disease: Secondary | ICD-10-CM | POA: Diagnosis not present

## 2017-06-18 DIAGNOSIS — Z7901 Long term (current) use of anticoagulants: Secondary | ICD-10-CM | POA: Diagnosis not present

## 2017-06-18 DIAGNOSIS — I4891 Unspecified atrial fibrillation: Secondary | ICD-10-CM

## 2017-06-18 LAB — POCT INR: INR: 2.8

## 2017-06-18 NOTE — Patient Instructions (Signed)
Description   Continue with 1 tablet each Sunday, Tuesday and Thursday, 1/2 tab all other days,  Repeat INR in 4 days

## 2017-06-19 DIAGNOSIS — E876 Hypokalemia: Secondary | ICD-10-CM | POA: Diagnosis not present

## 2017-06-19 DIAGNOSIS — I4891 Unspecified atrial fibrillation: Secondary | ICD-10-CM | POA: Diagnosis not present

## 2017-06-19 DIAGNOSIS — Z23 Encounter for immunization: Secondary | ICD-10-CM | POA: Diagnosis not present

## 2017-06-19 DIAGNOSIS — Z4901 Encounter for fitting and adjustment of extracorporeal dialysis catheter: Secondary | ICD-10-CM | POA: Diagnosis not present

## 2017-06-19 DIAGNOSIS — D689 Coagulation defect, unspecified: Secondary | ICD-10-CM | POA: Diagnosis not present

## 2017-06-19 DIAGNOSIS — N186 End stage renal disease: Secondary | ICD-10-CM | POA: Diagnosis not present

## 2017-06-21 DIAGNOSIS — R5381 Other malaise: Secondary | ICD-10-CM | POA: Diagnosis not present

## 2017-06-22 ENCOUNTER — Ambulatory Visit (INDEPENDENT_AMBULATORY_CARE_PROVIDER_SITE_OTHER): Payer: Medicare Other | Admitting: Pharmacist

## 2017-06-22 DIAGNOSIS — Z23 Encounter for immunization: Secondary | ICD-10-CM | POA: Diagnosis not present

## 2017-06-22 DIAGNOSIS — Z7901 Long term (current) use of anticoagulants: Secondary | ICD-10-CM

## 2017-06-22 DIAGNOSIS — I4891 Unspecified atrial fibrillation: Secondary | ICD-10-CM | POA: Diagnosis not present

## 2017-06-22 DIAGNOSIS — Z4901 Encounter for fitting and adjustment of extracorporeal dialysis catheter: Secondary | ICD-10-CM | POA: Diagnosis not present

## 2017-06-22 DIAGNOSIS — E876 Hypokalemia: Secondary | ICD-10-CM | POA: Diagnosis not present

## 2017-06-22 DIAGNOSIS — N186 End stage renal disease: Secondary | ICD-10-CM | POA: Diagnosis not present

## 2017-06-22 DIAGNOSIS — D689 Coagulation defect, unspecified: Secondary | ICD-10-CM | POA: Diagnosis not present

## 2017-06-22 LAB — POCT INR: INR: 2.4

## 2017-06-24 DIAGNOSIS — Z4901 Encounter for fitting and adjustment of extracorporeal dialysis catheter: Secondary | ICD-10-CM | POA: Diagnosis not present

## 2017-06-24 DIAGNOSIS — N186 End stage renal disease: Secondary | ICD-10-CM | POA: Diagnosis not present

## 2017-06-24 DIAGNOSIS — I4891 Unspecified atrial fibrillation: Secondary | ICD-10-CM | POA: Diagnosis not present

## 2017-06-24 DIAGNOSIS — Z23 Encounter for immunization: Secondary | ICD-10-CM | POA: Diagnosis not present

## 2017-06-24 DIAGNOSIS — D689 Coagulation defect, unspecified: Secondary | ICD-10-CM | POA: Diagnosis not present

## 2017-06-24 DIAGNOSIS — E876 Hypokalemia: Secondary | ICD-10-CM | POA: Diagnosis not present

## 2017-06-25 ENCOUNTER — Encounter: Payer: Self-pay | Admitting: Pharmacist Clinician (PhC)/ Clinical Pharmacy Specialist

## 2017-06-25 DIAGNOSIS — Z7901 Long term (current) use of anticoagulants: Secondary | ICD-10-CM

## 2017-06-25 DIAGNOSIS — I480 Paroxysmal atrial fibrillation: Secondary | ICD-10-CM

## 2017-06-25 NOTE — Progress Notes (Signed)
This encounter was created in error - please disregard.

## 2017-06-26 DIAGNOSIS — N186 End stage renal disease: Secondary | ICD-10-CM | POA: Diagnosis not present

## 2017-06-26 DIAGNOSIS — I4891 Unspecified atrial fibrillation: Secondary | ICD-10-CM | POA: Diagnosis not present

## 2017-06-26 DIAGNOSIS — E876 Hypokalemia: Secondary | ICD-10-CM | POA: Diagnosis not present

## 2017-06-26 DIAGNOSIS — D689 Coagulation defect, unspecified: Secondary | ICD-10-CM | POA: Diagnosis not present

## 2017-06-26 DIAGNOSIS — Z4901 Encounter for fitting and adjustment of extracorporeal dialysis catheter: Secondary | ICD-10-CM | POA: Diagnosis not present

## 2017-06-26 DIAGNOSIS — Z23 Encounter for immunization: Secondary | ICD-10-CM | POA: Diagnosis not present

## 2017-06-29 DIAGNOSIS — Z4901 Encounter for fitting and adjustment of extracorporeal dialysis catheter: Secondary | ICD-10-CM | POA: Diagnosis not present

## 2017-06-29 DIAGNOSIS — N186 End stage renal disease: Secondary | ICD-10-CM | POA: Diagnosis not present

## 2017-06-29 DIAGNOSIS — I4891 Unspecified atrial fibrillation: Secondary | ICD-10-CM | POA: Diagnosis not present

## 2017-06-29 DIAGNOSIS — Z23 Encounter for immunization: Secondary | ICD-10-CM | POA: Diagnosis not present

## 2017-06-29 DIAGNOSIS — E876 Hypokalemia: Secondary | ICD-10-CM | POA: Diagnosis not present

## 2017-06-29 DIAGNOSIS — D689 Coagulation defect, unspecified: Secondary | ICD-10-CM | POA: Diagnosis not present

## 2017-06-30 ENCOUNTER — Ambulatory Visit (INDEPENDENT_AMBULATORY_CARE_PROVIDER_SITE_OTHER): Payer: Medicare Other | Admitting: Pharmacist

## 2017-06-30 DIAGNOSIS — I480 Paroxysmal atrial fibrillation: Secondary | ICD-10-CM

## 2017-06-30 DIAGNOSIS — Z7901 Long term (current) use of anticoagulants: Secondary | ICD-10-CM | POA: Diagnosis not present

## 2017-06-30 DIAGNOSIS — I4891 Unspecified atrial fibrillation: Secondary | ICD-10-CM

## 2017-06-30 LAB — POCT INR: INR: 1.7

## 2017-07-01 DIAGNOSIS — Z23 Encounter for immunization: Secondary | ICD-10-CM | POA: Diagnosis not present

## 2017-07-01 DIAGNOSIS — I4891 Unspecified atrial fibrillation: Secondary | ICD-10-CM | POA: Diagnosis not present

## 2017-07-01 DIAGNOSIS — Z4901 Encounter for fitting and adjustment of extracorporeal dialysis catheter: Secondary | ICD-10-CM | POA: Diagnosis not present

## 2017-07-01 DIAGNOSIS — N186 End stage renal disease: Secondary | ICD-10-CM | POA: Diagnosis not present

## 2017-07-01 DIAGNOSIS — D689 Coagulation defect, unspecified: Secondary | ICD-10-CM | POA: Diagnosis not present

## 2017-07-01 DIAGNOSIS — E876 Hypokalemia: Secondary | ICD-10-CM | POA: Diagnosis not present

## 2017-07-03 DIAGNOSIS — Z23 Encounter for immunization: Secondary | ICD-10-CM | POA: Diagnosis not present

## 2017-07-03 DIAGNOSIS — E876 Hypokalemia: Secondary | ICD-10-CM | POA: Diagnosis not present

## 2017-07-03 DIAGNOSIS — N186 End stage renal disease: Secondary | ICD-10-CM | POA: Diagnosis not present

## 2017-07-03 DIAGNOSIS — I4891 Unspecified atrial fibrillation: Secondary | ICD-10-CM | POA: Diagnosis not present

## 2017-07-03 DIAGNOSIS — Z4901 Encounter for fitting and adjustment of extracorporeal dialysis catheter: Secondary | ICD-10-CM | POA: Diagnosis not present

## 2017-07-03 DIAGNOSIS — D689 Coagulation defect, unspecified: Secondary | ICD-10-CM | POA: Diagnosis not present

## 2017-07-05 DIAGNOSIS — I871 Compression of vein: Secondary | ICD-10-CM | POA: Diagnosis not present

## 2017-07-05 DIAGNOSIS — Z992 Dependence on renal dialysis: Secondary | ICD-10-CM | POA: Diagnosis not present

## 2017-07-05 DIAGNOSIS — N186 End stage renal disease: Secondary | ICD-10-CM | POA: Diagnosis not present

## 2017-07-05 LAB — CUP PACEART REMOTE DEVICE CHECK
Battery Voltage: 3.01 V
Brady Statistic AP VP Percent: 1 %
Brady Statistic AS VP Percent: 1 %
Brady Statistic RA Percent Paced: 28 %
Brady Statistic RV Percent Paced: 1 %
Implantable Lead Implant Date: 20180719
Implantable Pulse Generator Implant Date: 20180719
Lead Channel Impedance Value: 460 Ohm
Lead Channel Pacing Threshold Amplitude: 0.75 V
Lead Channel Pacing Threshold Amplitude: 0.75 V
Lead Channel Pacing Threshold Pulse Width: 0.5 ms
Lead Channel Sensing Intrinsic Amplitude: 12 mV
Lead Channel Setting Pacing Amplitude: 2 V
Lead Channel Setting Pacing Amplitude: 2.5 V
Lead Channel Setting Sensing Sensitivity: 2 mV
MDC IDC LEAD IMPLANT DT: 20180719
MDC IDC LEAD LOCATION: 753859
MDC IDC LEAD LOCATION: 753860
MDC IDC MSMT BATTERY REMAINING LONGEVITY: 118 mo
MDC IDC MSMT BATTERY REMAINING PERCENTAGE: 95.5 %
MDC IDC MSMT LEADCHNL RA IMPEDANCE VALUE: 390 Ohm
MDC IDC MSMT LEADCHNL RA SENSING INTR AMPL: 5 mV
MDC IDC MSMT LEADCHNL RV PACING THRESHOLD PULSEWIDTH: 0.5 ms
MDC IDC PG SERIAL: 8918511
MDC IDC SESS DTM: 20190123151358
MDC IDC SET LEADCHNL RV PACING PULSEWIDTH: 0.5 ms
MDC IDC STAT BRADY AP VS PERCENT: 29 %
MDC IDC STAT BRADY AS VS PERCENT: 71 %

## 2017-07-06 DIAGNOSIS — Z4901 Encounter for fitting and adjustment of extracorporeal dialysis catheter: Secondary | ICD-10-CM | POA: Diagnosis not present

## 2017-07-06 DIAGNOSIS — E876 Hypokalemia: Secondary | ICD-10-CM | POA: Diagnosis not present

## 2017-07-06 DIAGNOSIS — N186 End stage renal disease: Secondary | ICD-10-CM | POA: Diagnosis not present

## 2017-07-06 DIAGNOSIS — Z23 Encounter for immunization: Secondary | ICD-10-CM | POA: Diagnosis not present

## 2017-07-06 DIAGNOSIS — I4891 Unspecified atrial fibrillation: Secondary | ICD-10-CM | POA: Diagnosis not present

## 2017-07-06 DIAGNOSIS — D689 Coagulation defect, unspecified: Secondary | ICD-10-CM | POA: Diagnosis not present

## 2017-07-06 LAB — PROTIME-INR: INR: 3.2 — AB (ref 0.9–1.1)

## 2017-07-07 ENCOUNTER — Other Ambulatory Visit: Payer: Self-pay | Admitting: Pharmacist Clinician (PhC)/ Clinical Pharmacy Specialist

## 2017-07-07 ENCOUNTER — Ambulatory Visit (INDEPENDENT_AMBULATORY_CARE_PROVIDER_SITE_OTHER): Payer: Medicare Other | Admitting: Pharmacist Clinician (PhC)/ Clinical Pharmacy Specialist

## 2017-07-07 DIAGNOSIS — Z7901 Long term (current) use of anticoagulants: Secondary | ICD-10-CM

## 2017-07-07 DIAGNOSIS — I4891 Unspecified atrial fibrillation: Secondary | ICD-10-CM

## 2017-07-07 LAB — POCT INR: INR: 2.7

## 2017-07-07 MED ORDER — CARVEDILOL 25 MG PO TABS: 25.0000 mg | ORAL_TABLET | Freq: Every day | ORAL | 3 refills | Status: DC

## 2017-07-07 MED ORDER — CARVEDILOL 12.5 MG PO TABS: 12.5000 mg | ORAL_TABLET | Freq: Every day | ORAL | 3 refills | Status: DC

## 2017-07-07 NOTE — Patient Instructions (Signed)
Description   Continue with 1 tablet each Sunday, Tuesday and Thursday, 1/2 tab all other days,  Repeat INR in 2 week

## 2017-07-08 DIAGNOSIS — E876 Hypokalemia: Secondary | ICD-10-CM | POA: Diagnosis not present

## 2017-07-08 DIAGNOSIS — Z23 Encounter for immunization: Secondary | ICD-10-CM | POA: Diagnosis not present

## 2017-07-08 DIAGNOSIS — I4891 Unspecified atrial fibrillation: Secondary | ICD-10-CM | POA: Diagnosis not present

## 2017-07-08 DIAGNOSIS — Z4901 Encounter for fitting and adjustment of extracorporeal dialysis catheter: Secondary | ICD-10-CM | POA: Diagnosis not present

## 2017-07-08 DIAGNOSIS — D689 Coagulation defect, unspecified: Secondary | ICD-10-CM | POA: Diagnosis not present

## 2017-07-08 DIAGNOSIS — N186 End stage renal disease: Secondary | ICD-10-CM | POA: Diagnosis not present

## 2017-07-10 DIAGNOSIS — Z4901 Encounter for fitting and adjustment of extracorporeal dialysis catheter: Secondary | ICD-10-CM | POA: Diagnosis not present

## 2017-07-10 DIAGNOSIS — Z23 Encounter for immunization: Secondary | ICD-10-CM | POA: Diagnosis not present

## 2017-07-10 DIAGNOSIS — E876 Hypokalemia: Secondary | ICD-10-CM | POA: Diagnosis not present

## 2017-07-10 DIAGNOSIS — I4891 Unspecified atrial fibrillation: Secondary | ICD-10-CM | POA: Diagnosis not present

## 2017-07-10 DIAGNOSIS — I5041 Acute combined systolic (congestive) and diastolic (congestive) heart failure: Secondary | ICD-10-CM | POA: Diagnosis not present

## 2017-07-10 DIAGNOSIS — D689 Coagulation defect, unspecified: Secondary | ICD-10-CM | POA: Diagnosis not present

## 2017-07-10 DIAGNOSIS — N186 End stage renal disease: Secondary | ICD-10-CM | POA: Diagnosis not present

## 2017-07-13 DIAGNOSIS — Z23 Encounter for immunization: Secondary | ICD-10-CM | POA: Diagnosis not present

## 2017-07-13 DIAGNOSIS — E876 Hypokalemia: Secondary | ICD-10-CM | POA: Diagnosis not present

## 2017-07-13 DIAGNOSIS — I4891 Unspecified atrial fibrillation: Secondary | ICD-10-CM | POA: Diagnosis not present

## 2017-07-13 DIAGNOSIS — Z4901 Encounter for fitting and adjustment of extracorporeal dialysis catheter: Secondary | ICD-10-CM | POA: Diagnosis not present

## 2017-07-13 DIAGNOSIS — N186 End stage renal disease: Secondary | ICD-10-CM | POA: Diagnosis not present

## 2017-07-13 DIAGNOSIS — D689 Coagulation defect, unspecified: Secondary | ICD-10-CM | POA: Diagnosis not present

## 2017-07-15 DIAGNOSIS — E876 Hypokalemia: Secondary | ICD-10-CM | POA: Diagnosis not present

## 2017-07-15 DIAGNOSIS — I4891 Unspecified atrial fibrillation: Secondary | ICD-10-CM | POA: Diagnosis not present

## 2017-07-15 DIAGNOSIS — Z4901 Encounter for fitting and adjustment of extracorporeal dialysis catheter: Secondary | ICD-10-CM | POA: Diagnosis not present

## 2017-07-15 DIAGNOSIS — Z23 Encounter for immunization: Secondary | ICD-10-CM | POA: Diagnosis not present

## 2017-07-15 DIAGNOSIS — D689 Coagulation defect, unspecified: Secondary | ICD-10-CM | POA: Diagnosis not present

## 2017-07-15 DIAGNOSIS — N186 End stage renal disease: Secondary | ICD-10-CM | POA: Diagnosis not present

## 2017-07-16 DIAGNOSIS — H52223 Regular astigmatism, bilateral: Secondary | ICD-10-CM | POA: Diagnosis not present

## 2017-07-16 DIAGNOSIS — I120 Hypertensive chronic kidney disease with stage 5 chronic kidney disease or end stage renal disease: Secondary | ICD-10-CM | POA: Diagnosis not present

## 2017-07-16 DIAGNOSIS — H35033 Hypertensive retinopathy, bilateral: Secondary | ICD-10-CM | POA: Diagnosis not present

## 2017-07-16 DIAGNOSIS — H34232 Retinal artery branch occlusion, left eye: Secondary | ICD-10-CM | POA: Diagnosis not present

## 2017-07-16 DIAGNOSIS — H02054 Trichiasis without entropian left upper eyelid: Secondary | ICD-10-CM | POA: Diagnosis not present

## 2017-07-16 DIAGNOSIS — H53452 Other localized visual field defect, left eye: Secondary | ICD-10-CM | POA: Diagnosis not present

## 2017-07-16 DIAGNOSIS — Z992 Dependence on renal dialysis: Secondary | ICD-10-CM | POA: Diagnosis not present

## 2017-07-16 DIAGNOSIS — N186 End stage renal disease: Secondary | ICD-10-CM | POA: Diagnosis not present

## 2017-07-17 DIAGNOSIS — Z4901 Encounter for fitting and adjustment of extracorporeal dialysis catheter: Secondary | ICD-10-CM | POA: Diagnosis not present

## 2017-07-17 DIAGNOSIS — I4891 Unspecified atrial fibrillation: Secondary | ICD-10-CM | POA: Diagnosis not present

## 2017-07-17 DIAGNOSIS — N186 End stage renal disease: Secondary | ICD-10-CM | POA: Diagnosis not present

## 2017-07-17 DIAGNOSIS — E876 Hypokalemia: Secondary | ICD-10-CM | POA: Diagnosis not present

## 2017-07-17 DIAGNOSIS — D689 Coagulation defect, unspecified: Secondary | ICD-10-CM | POA: Diagnosis not present

## 2017-07-19 DIAGNOSIS — R5381 Other malaise: Secondary | ICD-10-CM | POA: Diagnosis not present

## 2017-07-20 DIAGNOSIS — D689 Coagulation defect, unspecified: Secondary | ICD-10-CM | POA: Diagnosis not present

## 2017-07-20 DIAGNOSIS — N186 End stage renal disease: Secondary | ICD-10-CM | POA: Diagnosis not present

## 2017-07-20 DIAGNOSIS — E876 Hypokalemia: Secondary | ICD-10-CM | POA: Diagnosis not present

## 2017-07-20 DIAGNOSIS — Z4901 Encounter for fitting and adjustment of extracorporeal dialysis catheter: Secondary | ICD-10-CM | POA: Diagnosis not present

## 2017-07-20 DIAGNOSIS — I4891 Unspecified atrial fibrillation: Secondary | ICD-10-CM | POA: Diagnosis not present

## 2017-07-21 ENCOUNTER — Ambulatory Visit (INDEPENDENT_AMBULATORY_CARE_PROVIDER_SITE_OTHER): Payer: Medicare Other | Admitting: Pharmacist

## 2017-07-21 DIAGNOSIS — Z7901 Long term (current) use of anticoagulants: Secondary | ICD-10-CM | POA: Diagnosis not present

## 2017-07-21 DIAGNOSIS — I4891 Unspecified atrial fibrillation: Secondary | ICD-10-CM

## 2017-07-21 LAB — POCT INR: INR: 1.9

## 2017-07-21 MED ORDER — CARVEDILOL 3.125 MG PO TABS: 3.1250 mg | ORAL_TABLET | Freq: Two times a day (BID) | ORAL | 0 refills | Status: DC

## 2017-07-21 MED ORDER — CARVEDILOL 3.125 MG PO TABS: 3.1250 mg | ORAL_TABLET | Freq: Two times a day (BID) | ORAL | 3 refills | Status: DC

## 2017-07-22 DIAGNOSIS — I4891 Unspecified atrial fibrillation: Secondary | ICD-10-CM | POA: Diagnosis not present

## 2017-07-22 DIAGNOSIS — D689 Coagulation defect, unspecified: Secondary | ICD-10-CM | POA: Diagnosis not present

## 2017-07-22 DIAGNOSIS — E876 Hypokalemia: Secondary | ICD-10-CM | POA: Diagnosis not present

## 2017-07-22 DIAGNOSIS — Z4901 Encounter for fitting and adjustment of extracorporeal dialysis catheter: Secondary | ICD-10-CM | POA: Diagnosis not present

## 2017-07-22 DIAGNOSIS — N186 End stage renal disease: Secondary | ICD-10-CM | POA: Diagnosis not present

## 2017-07-23 ENCOUNTER — Telehealth: Payer: Self-pay | Admitting: Family Medicine

## 2017-07-23 NOTE — Telephone Encounter (Signed)
Please advise. Thanks.  

## 2017-07-23 NOTE — Telephone Encounter (Signed)
Copied from [place]. Topic: Quick Communication - See Telephone Encounter >> Jul 23, 2017  3:17 PM Cleaster Corin, NT wrote: CRM for notification. See Telephone encounter for:   07/23/17.pt. Calling needing Dr. Jaymes Graff nurse to send a letter to [facility] letting them know that pt. No longer needs oxygen tank and they can come pick it up.  [facility] [address] [phone]

## 2017-07-24 DIAGNOSIS — I4891 Unspecified atrial fibrillation: Secondary | ICD-10-CM | POA: Diagnosis not present

## 2017-07-24 DIAGNOSIS — E876 Hypokalemia: Secondary | ICD-10-CM | POA: Diagnosis not present

## 2017-07-24 DIAGNOSIS — N186 End stage renal disease: Secondary | ICD-10-CM | POA: Diagnosis not present

## 2017-07-24 DIAGNOSIS — Z4901 Encounter for fitting and adjustment of extracorporeal dialysis catheter: Secondary | ICD-10-CM | POA: Diagnosis not present

## 2017-07-24 DIAGNOSIS — D689 Coagulation defect, unspecified: Secondary | ICD-10-CM | POA: Diagnosis not present

## 2017-07-26 DIAGNOSIS — M48061 Spinal stenosis, lumbar region without neurogenic claudication: Secondary | ICD-10-CM | POA: Diagnosis not present

## 2017-07-26 DIAGNOSIS — M545 Low back pain: Secondary | ICD-10-CM | POA: Diagnosis not present

## 2017-07-26 DIAGNOSIS — M5134 Other intervertebral disc degeneration, thoracic region: Secondary | ICD-10-CM | POA: Insufficient documentation

## 2017-07-27 DIAGNOSIS — I4891 Unspecified atrial fibrillation: Secondary | ICD-10-CM | POA: Diagnosis not present

## 2017-07-27 DIAGNOSIS — N186 End stage renal disease: Secondary | ICD-10-CM | POA: Diagnosis not present

## 2017-07-27 DIAGNOSIS — Z4901 Encounter for fitting and adjustment of extracorporeal dialysis catheter: Secondary | ICD-10-CM | POA: Diagnosis not present

## 2017-07-27 DIAGNOSIS — E876 Hypokalemia: Secondary | ICD-10-CM | POA: Diagnosis not present

## 2017-07-27 DIAGNOSIS — D689 Coagulation defect, unspecified: Secondary | ICD-10-CM | POA: Diagnosis not present

## 2017-07-27 LAB — PROTIME-INR: INR: 2.3 — AB (ref 0.9–1.1)

## 2017-07-28 ENCOUNTER — Encounter (HOSPITAL_BASED_OUTPATIENT_CLINIC_OR_DEPARTMENT_OTHER): Payer: Self-pay

## 2017-07-28 ENCOUNTER — Other Ambulatory Visit: Payer: Self-pay

## 2017-07-28 ENCOUNTER — Emergency Department (HOSPITAL_BASED_OUTPATIENT_CLINIC_OR_DEPARTMENT_OTHER): Payer: Medicare Other

## 2017-07-28 ENCOUNTER — Emergency Department (HOSPITAL_BASED_OUTPATIENT_CLINIC_OR_DEPARTMENT_OTHER)
Admission: EM | Admit: 2017-07-28 | Discharge: 2017-07-28 | Disposition: A | Discharge status: Home / Self Care | Payer: Medicare Other | Attending: Emergency Medicine | Admitting: Emergency Medicine

## 2017-07-28 DIAGNOSIS — W0110XA Fall on same level from slipping, tripping and stumbling with subsequent striking against unspecified object, initial encounter: Secondary | ICD-10-CM | POA: Insufficient documentation

## 2017-07-28 DIAGNOSIS — Y929 Unspecified place or not applicable: Secondary | ICD-10-CM | POA: Diagnosis not present

## 2017-07-28 DIAGNOSIS — Z7901 Long term (current) use of anticoagulants: Secondary | ICD-10-CM | POA: Insufficient documentation

## 2017-07-28 DIAGNOSIS — Z7902 Long term (current) use of antithrombotics/antiplatelets: Secondary | ICD-10-CM | POA: Diagnosis not present

## 2017-07-28 DIAGNOSIS — S0101XA Laceration without foreign body of scalp, initial encounter: Secondary | ICD-10-CM

## 2017-07-28 DIAGNOSIS — S300XXA Contusion of lower back and pelvis, initial encounter: Secondary | ICD-10-CM | POA: Diagnosis not present

## 2017-07-28 DIAGNOSIS — Z23 Encounter for immunization: Secondary | ICD-10-CM | POA: Insufficient documentation

## 2017-07-28 DIAGNOSIS — Y9301 Activity, walking, marching and hiking: Secondary | ICD-10-CM | POA: Diagnosis not present

## 2017-07-28 DIAGNOSIS — Z79899 Other long term (current) drug therapy: Secondary | ICD-10-CM | POA: Diagnosis not present

## 2017-07-28 DIAGNOSIS — Z87891 Personal history of nicotine dependence: Secondary | ICD-10-CM | POA: Insufficient documentation

## 2017-07-28 DIAGNOSIS — Z992 Dependence on renal dialysis: Secondary | ICD-10-CM | POA: Insufficient documentation

## 2017-07-28 DIAGNOSIS — Y999 Unspecified external cause status: Secondary | ICD-10-CM | POA: Insufficient documentation

## 2017-07-28 DIAGNOSIS — I5042 Chronic combined systolic (congestive) and diastolic (congestive) heart failure: Secondary | ICD-10-CM | POA: Insufficient documentation

## 2017-07-28 DIAGNOSIS — S79911A Unspecified injury of right hip, initial encounter: Secondary | ICD-10-CM | POA: Diagnosis not present

## 2017-07-28 DIAGNOSIS — N186 End stage renal disease: Secondary | ICD-10-CM | POA: Insufficient documentation

## 2017-07-28 DIAGNOSIS — I132 Hypertensive heart and chronic kidney disease with heart failure and with stage 5 chronic kidney disease, or end stage renal disease: Secondary | ICD-10-CM | POA: Diagnosis not present

## 2017-07-28 DIAGNOSIS — S0990XA Unspecified injury of head, initial encounter: Secondary | ICD-10-CM | POA: Diagnosis not present

## 2017-07-28 MED ORDER — TETANUS-DIPHTH-ACELL PERTUSSIS 5-2.5-18.5 LF-MCG/0.5 IM SUSP
0.5000 mL | Freq: Once | INTRAMUSCULAR | Status: AC
  Administered 2017-07-28: 0.5 mL via INTRAMUSCULAR
  Filled 2017-07-28: qty 0.5

## 2017-07-28 MED ORDER — HYDROCODONE-ACETAMINOPHEN 5-325 MG PO TABS
1.0000 | ORAL_TABLET | Freq: Once | ORAL | Status: AC
  Administered 2017-07-28: 1 via ORAL
  Filled 2017-07-28: qty 1

## 2017-07-28 MED ORDER — HYDROCODONE-ACETAMINOPHEN 5-325 MG PO TABS: 1.0000 | ORAL_TABLET | Freq: Four times a day (QID) | ORAL | 0 refills | Status: DC | PRN

## 2017-07-28 NOTE — ED Provider Notes (Signed)
[facility] EMERGENCY DEPARTMENT Provider Note   CSN: [id number] Arrival date & time: 07/28/17  1252     History   Chief Complaint Chief Complaint  Patient presents with  . Fall    HPI Valerie Terrell is a 84 y.o. female.  HPI Patient with history of atrial fibrillation on Coumadin presents after missing a step and falling backwards striking her head at 1130 this morning.  Denies any preceding symptoms.  Denies loss of consciousness.  Sustained laceration to the back of head.  Patient states the only reason she came to the emergency department was because she is having difficulty controlling the bleeding.  Bleeding is now stopped.  No visual changes.  Denies neck pain.  No focal weakness or numbness.  Patient does complain of right sided posterior buttock pain.  She has been ambulatory since the fall.  Unknown last tetanus.  Last INR was 1.9 earlier this month. Past Medical History:  Diagnosis Date  . Anemia of chronic disease 2018   + anemia of CRI?  [name] Atrophy of left kidney    with absent blood flow by renal artery dopplers (Dr. Gwenlyn Found)  . 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 Chronic combined systolic and diastolic CHF (congestive heart failure) ([place])    a. 11/2016: echo showing EF of 35-40%, RV strain noted, mild MR and mild TR.   [name] Chronic hypoxemic respiratory failure ([place])   . Debilitated patient    WC dependent as of 2018  . Dysrhythmia    afib  . ESRD on hemodialysis ([place]) 01/2017   T/Th/Sat schedule- [place].  Right basilic AV fistula 05/7508.  . Family history of adverse reaction to anesthesia    Daughter- nausea  . History of adenomatous polyp of colon   . History of kidney stones    passed  . History of subarachnoid hemorrhage 10/2014   after syncope and while on xarelto  . HOH (hard of hearing)   . Hyperlipidemia   . Hypertension    Difficult to control, in the setting of one functioning kidney: pt was referred to nephrology by  Dr. Gwenlyn Found 06/2015.  . Lumbar radiculopathy 2012  . Lumbar spondylosis    MR 07/2016---no sign of spinal nerve compression or cord compression.  Pt set up with outpt ortho while admitted to hosp 07/2016.  . Malnourished ([place])   . Metatarsal fracture 06/10/2016   Nondisplaced, left 5th metatarsal--pt was referred to ortho  . Nonischemic cardiomyopathy ([place])   . Osteopenia 2014   T-score -2.1  . PAF (paroxysmal atrial fibrillation) (HCC)    Eliquis started after BRAO and CVA.  She was changed to warfarin 2018--INR/coumadin mgmt per HD/nephrol.  . Presence of permanent cardiac pacemaker   . Pulmonary edema 11/2016  . Right rib fracture 12/2016   s/p fall  . Sick sinus syndrome ([place])    Dual chamber pacer insertion 2018 (Dr. Lovena Le)  . Stroke [facility])    cardioembolic (had CVA while on no anticoag)--"scattered subacute punctate infarcts: 1 in R parietal lobe and 2 in occipital cortex" on MRI br.  CT angio head/neck: aortic arch athero.   . Thoracic back pain 01/2017   [place]?  Dr. Nelva Bush to do steroid injection as of 02/2017  . Thoracic compression fracture ([place]) 07/2016   T7 and T8-- T8 kyphoplasty during hosp admission 07/2016.  Neuro referred pt to pain mgmt for consideration of injection 09/2016.  I referred her to endo 08/2016 for consideration of  calcitonin treatment.    Patient Active Problem List   Diagnosis Date Noted  . ESRD (end stage renal disease) ([place]) 02/13/2017  . Acute respiratory failure ([place]) 02/13/2017  . Hyperkalemia 02/13/2017  . ESRD on dialysis ([place]) 02/11/2017  . Chronic respiratory failure with hypoxia ([place]) 02/11/2017  . Debilitated patient 02/07/2017  . Poor tolerance for ambulation 02/07/2017  . Fracture of one rib, right side, initial encounter for closed fracture 01/11/2017  . Acute on chronic systolic congestive heart failure ([place])   . Closed fracture of one rib of right side   . Chronic combined systolic and diastolic heart failure ([place]) 01/05/2017  .  Palpitations 12/15/2016  . Hypokalemia 12/14/2016  . Leukocytosis 12/14/2016  . Atrial fibrillation ([place]) [I48.91] 12/11/2016  . Long term (current) use of anticoagulants [Z79.01] 12/11/2016  . Tachycardia-bradycardia syndrome ([place]) - s/p PPM 12/07/2016  . Non-ischemic cardiomyopathy ([place]) 12/07/2016  . Acute combined systolic and diastolic heart failure ([place])  12/07/2016  . Cardiac pacemaker   . Acute on chronic respiratory failure with hypoxia ([place])   . Acute pulmonary edema with congestive heart failure ([place]) 11/30/2016  . Elevated troponin level 11/30/2016  . Anemia 11/30/2016  . Chronic midline thoracic back pain 09/15/2016  . Acute CVA (cerebrovascular accident) ([place]) 09/15/2016  . Hyperlipidemia   . Nausea & vomiting 08/05/2016  . Dehydration 08/05/2016  . Acute kidney injury superimposed on CKD ([place]) 08/05/2016  . Osteoporosis 07/28/2016  . Closed compression fracture of thoracic vertebra ([place]) 07/23/2016  . Thoracic compression fracture ([place]) 07/22/2016  . Acute renal failure superimposed on chronic kidney disease ([place]) 07/11/2016  . Flank pain 07/11/2016  . Hypoalbuminemia 07/11/2016  . Renal artery stenosis ([place]) 06/24/2016  . Nondisplaced fracture of fifth metatarsal bone, left foot, initial encounter for closed fracture 06/10/2016  . Rib contusion, left, initial encounter 06/10/2016  . Single kidney 03/13/2016  . Chest pain   . Dyslipidemia   . PAF (paroxysmal atrial fibrillation) ([place]) 01/28/2016  . Essential hypertension 01/28/2016  . History of cardioembolic stroke 42/68/3419  . 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 Personal history of subarachnoid hemorrhage 01/28/2016  . CKD (chronic kidney disease), stage IV ([facility]) 01/28/2016    Past Surgical History:  Procedure Laterality Date  . APPENDECTOMY  child  . AV FISTULA PLACEMENT Right 03/31/2017   Procedure: Right ARM Basilic vein transposition;  Surgeon: Conrad Mount Hood, MD;  Location: [facility];   Service: Vascular;  Laterality: Right;  . CARDIOVASCULAR STRESS TEST  01/31/2016   Stress myoview: NORMAL/Low risk.  EF 56%.  . [place]  . COLONOSCOPY  2015   + hx of adenomatous polyps.  Need digest health spec in [facility] records to see when pt due for next colonoscopy  . EYE SURGERY Bilateral    Catarct  . INSERTION OF DIALYSIS CATHETER Right 01/29/2017   Procedure: INSERTION OF DIALYSIS CATHETER- RIGHT INTERNAL JUGULAR;  Surgeon: [name], MD;  Location: [facility] OR;  Service: Vascular;  Laterality: Right;  . IR GENERIC HISTORICAL  07/24/2016   IR KYPHO THORACIC WITH BONE BIOPSY 07/24/2016 Luanne Bras, MD [facility]-INTERV RAD  . KYPHOPLASTY  07/27/2016   T8  . PACEMAKER IMPLANT N/A 12/03/2016   Procedure: Pacemaker Implant;  Surgeon: Evans Lance, MD;  Location: [facility] LAB;  Service: Cardiovascular;  Laterality: N/A;  . Renal artery dopplers  02/26/2016   Her right renal dimension was 11 cm pole to pole with mild to moderate right  renal artery stenosis. A right renal aortic ratio was 3.22 suggesting less than a 50% stenosis.  . TONSILLECTOMY    . TRANSTHORACIC ECHOCARDIOGRAM  01/29/2016; 11/2016   2017: EF 55-60%, normal LV wall motion, grade I DD.  No cardiac source of emboli was seen.  2018: EF 35-40%, could not assess DD due to a-fib, pulm HTN noted.    OB History    No data available       Home Medications    Prior to Admission medications   Medication Sig Start Date End Date Taking? Authorizing Provider  acetaminophen (TYLENOL) 500 MG tablet Take 500-1,000 mg 3 (three) times daily as needed by mouth for moderate pain.     [provider]  amiodarone (PACERONE) 200 MG tablet Take 1 tablet (200 mg total) by mouth daily. 05/03/17   Lorretta Harp, MD  carvedilol (COREG) 3.125 MG tablet Take 1 tablet (3.125 mg total) by mouth 2 (two) times daily. 07/21/17   Lorretta Harp, MD  carvedilol (COREG) 3.125 MG tablet Take 1 tablet (3.125 mg total) by  mouth 2 (two) times daily. 07/21/17 10/19/17  Lorretta Harp, MD  HYDROcodone-acetaminophen (NORCO) 5-325 MG tablet Take 1 tablet by mouth every 6 (six) hours as needed for moderate pain or severe pain. 07/28/17   Julianne Rice, MD  ipratropium (ATROVENT) 0.03 % nasal spray Place 2 sprays into both nostrils every 12 (twelve) hours. 05/28/17   McGowen, Adrian Blackwater, MD  isosorbide mononitrate (IMDUR) 60 MG 24 hr tablet Take 1 tablet (60 mg total) daily by mouth. 03/24/17   McGowen, Adrian Blackwater, MD  Multiple Vitamin (MULTIVITAMIN) tablet Take 1 tablet by mouth 3 (three) times a week.     [provider]  ondansetron (ZOFRAN) 4 MG tablet Take 0.5-1 tablets (2-4 mg total) by mouth every 8 (eight) hours as needed for nausea or vomiting. 12/10/16   McGowen, Adrian Blackwater, MD  oxyCODONE-acetaminophen (PERCOCET/ROXICET) 5-325 MG tablet Take 1-2 tablets every 6 (six) hours as needed by mouth for severe pain. 03/31/17   Rhyne, Hulen Shouts, PA-C  polyethylene glycol (MIRALAX / GLYCOLAX) packet Take 17 g by mouth daily as needed. Patient taking differently: Take 17 g daily as needed by mouth for mild constipation.  02/17/17   Domenic Polite, MD  pravastatin (PRAVACHOL) 40 MG tablet Take 1 tablet (40 mg total) every evening by mouth. 03/24/17   McGowen, Adrian Blackwater, MD  sevelamer carbonate (RENVELA) 800 MG tablet Take 800 mg by mouth 2 (two) times daily with a meal.    [provider]  vitamin C (ASCORBIC ACID) 500 MG tablet Take 500 mg by mouth daily.    [provider]  Vitamin D, Ergocalciferol, (DRISDOL) 50000 units CAPS capsule Take 1 capsule (50,000 Units total) by mouth every 7 (seven) days. 05/03/17   McGowenAdrian Blackwater, MD  warfarin (COUMADIN) 3 MG tablet Take 1/2 to 1 tablets daily as directed by coumadin clinic 04/07/17   Lorretta Harp, MD    Family History Family History  Problem Relation Age of Onset  . Cancer Mother   . Heart disease Father   . Early death Father   . Sudden Cardiac  Death Neg Hx     Social History Social History   Tobacco Use  . Smoking status: Former Smoker    Years: 22.00    Last attempt to quit: 03/17/1972    Years since quitting: 45.3  . Smokeless tobacco: Never Used  Substance Use Topics  .  Alcohol use: Yes    Alcohol/week: 0.6 oz    Types: 1 Glasses of wine per week    Comment: wine  . Drug use: No     Allergies   Clonidine derivatives; Codeine; Nickel; and Sulfa antibiotics   Review of Systems Review of Systems  Constitutional: Negative for chills and fever.  HENT: Negative for facial swelling.   Eyes: Negative for visual disturbance.  Respiratory: Negative for shortness of breath.   Cardiovascular: Negative for chest pain, palpitations and leg swelling.  Gastrointestinal: Negative for abdominal pain, nausea and vomiting.  Musculoskeletal: Positive for arthralgias. Negative for back pain, gait problem and neck pain.  Skin: Positive for wound. Negative for rash.  Neurological: Positive for headaches. Negative for dizziness, syncope, weakness, light-headedness and numbness.  All other systems reviewed and are negative.    Physical Exam Updated Vital Signs BP 138/63 (BP Location: Left Arm)   Pulse 76   Temp 98.2 F (36.8 C) (Oral)   Resp 18   Ht 4\' 11"  (1.499 m)   Wt 48.1 kg (106 lb)   SpO2 95%   BMI 21.41 kg/m   Physical Exam  Constitutional: She is oriented to person, place, and time. She appears well-developed and well-nourished. No distress.  HENT:  Head: Normocephalic and atraumatic.  Mouth/Throat: Oropharynx is clear and moist.  Patient with posterior scalp laceration roughly 3 cm in length.  There is some dried blood at the site but no active bleeding.  Eyes: EOM are normal. Pupils are equal, round, and reactive to light.  Neck: Normal range of motion. Neck supple.  No posterior midline cervical tenderness to palpation.  Cardiovascular: Normal rate and regular rhythm.  Pulmonary/Chest: Effort normal and  breath sounds normal.  Abdominal: Soft. Bowel sounds are normal. There is no tenderness. There is no rebound and no guarding.  Musculoskeletal: Normal range of motion. She exhibits tenderness. She exhibits no edema.  Mild tenderness over the right buttock.  Patient has full range of motion of the right hip.  There is no limb shortening or rotation.  No midline thoracic or lumbar tenderness.  Distal pulses are 2+.  Neurological: She is alert and oriented to person, place, and time.  5/5 motor in all extremities.  Sensation intact.  Skin: Skin is warm and dry. Capillary refill takes less than 2 seconds. No rash noted. She is not diaphoretic. No erythema.  Psychiatric: She has a normal mood and affect. Her behavior is normal.  Nursing note and vitals reviewed.    ED Treatments / Results  Labs (all labs ordered are listed, but only abnormal results are displayed) Labs Reviewed - No data to display  EKG  EKG Interpretation None       Radiology Ct Head Wo Contrast  Result Date: 07/28/2017 CLINICAL DATA:  Head trauma after fall. EXAM: CT HEAD WITHOUT CONTRAST TECHNIQUE: Contiguous axial images were obtained from the base of the skull through the vertex without intravenous contrast. COMPARISON:  CT head dated December 31, 2016. FINDINGS: Brain: No evidence of acute infarction, hemorrhage, hydrocephalus, extra-axial collection or mass lesion/mass effect. Stable atrophy and chronic microvascular ischemic changes. Vascular: Calcified atherosclerosis at the skullbase. No hyperdense vessel. Skull: Negative for fracture or focal lesion. Sinuses/Orbits: No acute finding. Other: None. IMPRESSION: 1.  No acute intracranial abnormality. Electronically Signed   By: Titus Dubin M.D.   On: 07/28/2017 13:53   Dg Hip Unilat W Or Wo Pelvis 2-3 Views Right  Result Date: 07/28/2017 CLINICAL DATA:  Fall. EXAM: DG HIP (WITH OR WITHOUT PELVIS) 2-3V RIGHT COMPARISON:  12/31/2016 FINDINGS: Negative for fracture or  arthropathy. No pelvic lesion. Vascular calcification. IMPRESSION: Negative. Electronically Signed   By: Franchot Gallo M.D.   On: 07/28/2017 13:59    Procedures .Marland KitchenLaceration Repair Date/Time: 07/28/2017 4:08 PM Performed by: Julianne Rice, MD Authorized by: Julianne Rice, MD   Consent:    Consent obtained:  Verbal   Consent given by:  Patient Anesthesia (see MAR for exact dosages):    Anesthesia method:  None Laceration details:    Location:  Scalp   Length (cm):  3 Repair type:    Repair type:  Simple Exploration:    Wound extent: no foreign bodies/material noted     Contaminated: no   Treatment:    Amount of cleaning:  Standard   Irrigation solution:  Sterile saline   Visualized foreign bodies/material removed: no   Skin repair:    Repair method:  Staples   Number of staples:  2 Approximation:    Approximation:  Close   Vermilion border: well-aligned   Post-procedure details:    Dressing:  Antibiotic ointment   (including critical care time)  Medications Ordered in ED Medications  HYDROcodone-acetaminophen (NORCO/VICODIN) 5-325 MG per tablet 1 tablet (1 tablet Oral Given 07/28/17 1519)  Tdap (BOOSTRIX) injection 0.5 mL (0.5 mLs Intramuscular Given 07/28/17 1528)     Initial Impression / Assessment and Plan / ED Course  I have reviewed the triage vital signs and the nursing notes.  Pertinent labs & imaging results that were available during my care of the patient were reviewed by me and considered in my medical decision making (see chart for details).     Will update tetanus.  CT without acute intracranial findings.  Right hip x-ray without evidence of fracture.  Patient is neurologically stable.  Will irrigate and close wound.  Patient is been given head injury precautions regarding delayed bleeding and has voiced understanding.  Final Clinical Impressions(s) / ED Diagnoses   Final diagnoses:  Closed head injury, initial encounter  Laceration of scalp,  initial encounter  Contusion of buttock, initial encounter    ED Discharge Orders        Ordered    HYDROcodone-acetaminophen (NORCO) 5-325 MG tablet  Every 6 hours PRN     07/28/17 1607       Julianne Rice, MD 07/28/17 1609

## 2017-07-28 NOTE — ED Notes (Signed)
Discussed pt with EDP [place]-orders received

## 2017-07-28 NOTE — ED Triage Notes (Signed)
Pt states she missed a step approx 1130am-c/o pain to right hip and lac to back of head-denies LOC-denies neck pain-presents to triage in w/c

## 2017-07-29 DIAGNOSIS — E876 Hypokalemia: Secondary | ICD-10-CM | POA: Diagnosis not present

## 2017-07-29 DIAGNOSIS — D689 Coagulation defect, unspecified: Secondary | ICD-10-CM | POA: Diagnosis not present

## 2017-07-29 DIAGNOSIS — N186 End stage renal disease: Secondary | ICD-10-CM | POA: Diagnosis not present

## 2017-07-29 DIAGNOSIS — I4891 Unspecified atrial fibrillation: Secondary | ICD-10-CM | POA: Diagnosis not present

## 2017-07-29 DIAGNOSIS — Z4901 Encounter for fitting and adjustment of extracorporeal dialysis catheter: Secondary | ICD-10-CM | POA: Diagnosis not present

## 2017-07-30 DIAGNOSIS — M47816 Spondylosis without myelopathy or radiculopathy, lumbar region: Secondary | ICD-10-CM | POA: Diagnosis not present

## 2017-07-31 DIAGNOSIS — I4891 Unspecified atrial fibrillation: Secondary | ICD-10-CM | POA: Diagnosis not present

## 2017-07-31 DIAGNOSIS — Z4901 Encounter for fitting and adjustment of extracorporeal dialysis catheter: Secondary | ICD-10-CM | POA: Diagnosis not present

## 2017-07-31 DIAGNOSIS — N186 End stage renal disease: Secondary | ICD-10-CM | POA: Diagnosis not present

## 2017-07-31 DIAGNOSIS — D689 Coagulation defect, unspecified: Secondary | ICD-10-CM | POA: Diagnosis not present

## 2017-07-31 DIAGNOSIS — E876 Hypokalemia: Secondary | ICD-10-CM | POA: Diagnosis not present

## 2017-08-03 DIAGNOSIS — I4891 Unspecified atrial fibrillation: Secondary | ICD-10-CM | POA: Diagnosis not present

## 2017-08-03 DIAGNOSIS — Z4901 Encounter for fitting and adjustment of extracorporeal dialysis catheter: Secondary | ICD-10-CM | POA: Diagnosis not present

## 2017-08-03 DIAGNOSIS — E876 Hypokalemia: Secondary | ICD-10-CM | POA: Diagnosis not present

## 2017-08-03 DIAGNOSIS — N186 End stage renal disease: Secondary | ICD-10-CM | POA: Diagnosis not present

## 2017-08-03 DIAGNOSIS — D689 Coagulation defect, unspecified: Secondary | ICD-10-CM | POA: Diagnosis not present

## 2017-08-03 LAB — PROTIME-INR: INR: 2.3 — AB (ref 0.9–1.1)

## 2017-08-04 ENCOUNTER — Ambulatory Visit (INDEPENDENT_AMBULATORY_CARE_PROVIDER_SITE_OTHER): Payer: Medicare Other | Admitting: Pharmacist

## 2017-08-04 DIAGNOSIS — I480 Paroxysmal atrial fibrillation: Secondary | ICD-10-CM | POA: Diagnosis not present

## 2017-08-04 DIAGNOSIS — Z7901 Long term (current) use of anticoagulants: Secondary | ICD-10-CM | POA: Diagnosis not present

## 2017-08-04 DIAGNOSIS — I4891 Unspecified atrial fibrillation: Secondary | ICD-10-CM | POA: Diagnosis not present

## 2017-08-04 LAB — POCT INR: INR: 1.8

## 2017-08-05 ENCOUNTER — Ambulatory Visit (INDEPENDENT_AMBULATORY_CARE_PROVIDER_SITE_OTHER): Payer: Medicare Other | Admitting: Family Medicine

## 2017-08-05 ENCOUNTER — Ambulatory Visit: Payer: Self-pay | Admitting: Family Medicine

## 2017-08-05 ENCOUNTER — Encounter: Payer: Self-pay | Admitting: Family Medicine

## 2017-08-05 ENCOUNTER — Encounter: Payer: Self-pay | Admitting: *Deleted

## 2017-08-05 VITALS — BP 157/67 | HR 60 | Temp 97.4°F | Ht 59.0 in

## 2017-08-05 DIAGNOSIS — N186 End stage renal disease: Secondary | ICD-10-CM | POA: Diagnosis not present

## 2017-08-05 DIAGNOSIS — S0101XD Laceration without foreign body of scalp, subsequent encounter: Secondary | ICD-10-CM

## 2017-08-05 DIAGNOSIS — J9601 Acute respiratory failure with hypoxia: Secondary | ICD-10-CM

## 2017-08-05 DIAGNOSIS — I4891 Unspecified atrial fibrillation: Secondary | ICD-10-CM | POA: Diagnosis not present

## 2017-08-05 DIAGNOSIS — E876 Hypokalemia: Secondary | ICD-10-CM | POA: Diagnosis not present

## 2017-08-05 DIAGNOSIS — Z4901 Encounter for fitting and adjustment of extracorporeal dialysis catheter: Secondary | ICD-10-CM | POA: Diagnosis not present

## 2017-08-05 DIAGNOSIS — Z4802 Encounter for removal of sutures: Secondary | ICD-10-CM

## 2017-08-05 DIAGNOSIS — D689 Coagulation defect, unspecified: Secondary | ICD-10-CM | POA: Diagnosis not present

## 2017-08-05 NOTE — Progress Notes (Signed)
OFFICE VISIT  08/05/2017   CC:  Chief Complaint  Patient presents with  . Follow-up    ER     HPI:    Patient is a 84 y.o. Caucasian female who presents for staples removal from recent scalp laceration sustained when she had a fall on 07/28/17. She missed a step and fell backwards, landed on R hip/glut and hit back of head--lac on scalp. No LOC. CT head neg acute.  R hip x-ray neg. She has a-fib and is on coumadin--anticoag managed by cardiology coumadin clinic: INR yesterday 1.8.  Currently says she feels good, no HA--"ready to get these staples out b/c I can't wash my hair".  Past Medical History:  Diagnosis Date  . Anemia of chronic disease 2018   + anemia of CRI?  [name] Atrophy of left kidney    with absent blood flow by renal artery dopplers (Dr. Gwenlyn Found)  . 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 Chronic combined systolic and diastolic CHF (congestive heart failure) ([place])    a. 11/2016: echo showing EF of 35-40%, RV strain noted, mild MR and mild TR.   [name] Chronic hypoxemic respiratory failure ([place])   . Debilitated patient    WC dependent as of 2018  . Dysrhythmia    afib  . ESRD on hemodialysis ([place]) 01/2017   T/Th/Sat schedule- [place].  Right basilic AV fistula 82/5053.  . Family history of adverse reaction to anesthesia    Daughter- nausea  . History of adenomatous polyp of colon   . History of kidney stones    passed  . History of subarachnoid hemorrhage 10/2014   after syncope and while on xarelto  . HOH (hard of hearing)   . Hyperlipidemia   . Hypertension    Difficult to control, in the setting of one functioning kidney: pt was referred to nephrology by Dr. Gwenlyn Found 06/2015.  . Lumbar radiculopathy 2012  . Lumbar spondylosis    MR 07/2016---no sign of spinal nerve compression or cord compression.  Pt set up with outpt ortho while admitted to hosp 07/2016.  . Malnourished ([place])   . Metatarsal fracture 06/10/2016   Nondisplaced, left 5th  metatarsal--pt was referred to ortho  . Nonischemic cardiomyopathy ([place])   . Osteopenia 2014   T-score -2.1  . PAF (paroxysmal atrial fibrillation) (HCC)    Eliquis started after BRAO and CVA.  She was changed to warfarin 2018--INR/coumadin mgmt per HD/nephrol.  . Presence of permanent cardiac pacemaker   . Pulmonary edema 11/2016  . Right rib fracture 12/2016   s/p fall  . Sick sinus syndrome ([place])    Dual chamber pacer insertion 2018 (Dr. Lovena Le)  . Stroke [facility])    cardioembolic (had CVA while on no anticoag)--"scattered subacute punctate infarcts: 1 in R parietal lobe and 2 in occipital cortex" on MRI br.  CT angio head/neck: aortic arch athero.   . Thoracic back pain 01/2017   Facet?  Dr. Nelva Bush to do steroid injection as of 02/2017  . Thoracic compression fracture ([place]) 07/2016   T7 and T8-- T8 kyphoplasty during hosp admission 07/2016.  Neuro referred pt to pain mgmt for consideration of injection 09/2016.  I referred her to endo 08/2016 for consideration of calcitonin treatment.    Past Surgical History:  Procedure Laterality Date  . APPENDECTOMY  child  . AV FISTULA PLACEMENT Right 03/31/2017   Procedure: Right ARM Basilic vein transposition;  Surgeon: Conrad [place], MD;  Location: [place];  Service: Vascular;  Laterality: Right;  . CARDIOVASCULAR STRESS TEST  01/31/2016   Stress myoview: NORMAL/Low risk.  EF 56%.  . [place]  . COLONOSCOPY  2015   + hx of adenomatous polyps.  Need digest health spec in [place] records to see when pt due for next colonoscopy  . EYE SURGERY Bilateral    Catarct  . INSERTION OF DIALYSIS CATHETER Right 01/29/2017   Procedure: INSERTION OF DIALYSIS CATHETER- RIGHT INTERNAL JUGULAR;  Surgeon: [name], MD;  Location: [facility] OR;  Service: Vascular;  Laterality: Right;  . IR GENERIC HISTORICAL  07/24/2016   IR KYPHO THORACIC WITH BONE BIOPSY 07/24/2016 Luanne Bras, MD [facility]-INTERV RAD  . KYPHOPLASTY  07/27/2016   T8  . PACEMAKER IMPLANT  N/A 12/03/2016   Procedure: Pacemaker Implant;  Surgeon: Evans Lance, MD;  Location: [facility];  Service: Cardiovascular;  Laterality: N/A;  . Renal artery dopplers  02/26/2016   Her right renal dimension was 11 cm pole to pole with mild to moderate right renal artery stenosis. A right renal aortic ratio was 3.22 suggesting less than a 50% stenosis.  . TONSILLECTOMY    . TRANSTHORACIC ECHOCARDIOGRAM  01/29/2016; 11/2016   2017: EF 55-60%, normal LV wall motion, grade I DD.  No cardiac source of emboli was seen.  2018: EF 35-40%, could not assess DD due to a-fib, pulm HTN noted.    Outpatient Medications Prior to Visit  Medication Sig Dispense Refill  . acetaminophen (TYLENOL) 500 MG tablet Take 500-1,000 mg 3 (three) times daily as needed by mouth for moderate pain.     [name] amiodarone (PACERONE) 200 MG tablet Take 1 tablet (200 mg total) by mouth daily. 90 tablet 1  . carvedilol (COREG) 3.125 MG tablet Take 1 tablet (3.125 mg total) by mouth 2 (two) times daily. 30 tablet 0  . isosorbide mononitrate (IMDUR) 60 MG 24 hr tablet Take 1 tablet (60 mg total) daily by mouth. 90 tablet 1  . Multiple Vitamin (MULTIVITAMIN) tablet Take 1 tablet by mouth 3 (three) times a week.     . ondansetron (ZOFRAN) 4 MG tablet Take 0.5-1 tablets (2-4 mg total) by mouth every 8 (eight) hours as needed for nausea or vomiting. 90 tablet 3  . oxyCODONE-acetaminophen (PERCOCET/ROXICET) 5-325 MG tablet Take 1-2 tablets every 6 (six) hours as needed by mouth for severe pain. 8 tablet 0  . polyethylene glycol (MIRALAX / GLYCOLAX) packet Take 17 g by mouth daily as needed. (Patient taking differently: Take 17 g daily as needed by mouth for mild constipation. ) 10 each 0  . pravastatin (PRAVACHOL) 40 MG tablet Take 1 tablet (40 mg total) every evening by mouth. 90 tablet 1  . sevelamer carbonate (RENVELA) 800 MG tablet Take 800 mg by mouth 2 (two) times daily with a meal.    . vitamin C (ASCORBIC ACID) 500 MG tablet  Take 500 mg by mouth daily.    . Vitamin D, Ergocalciferol, (DRISDOL) 50000 units CAPS capsule Take 1 capsule (50,000 Units total) by mouth every 7 (seven) days. 12 capsule 3  . warfarin (COUMADIN) 3 MG tablet Take 1/2 to 1 tablets daily as directed by coumadin clinic 90 tablet 1  . carvedilol (COREG) 3.125 MG tablet Take 1 tablet (3.125 mg total) by mouth 2 (two) times daily. 180 tablet 3  . ipratropium (ATROVENT) 0.03 % nasal spray Place 2 sprays into both nostrils every 12 (twelve) hours. 30 mL 3  . HYDROcodone-acetaminophen (  NORCO) 5-325 MG tablet Take 1 tablet by mouth every 6 (six) hours as needed for moderate pain or severe pain. (Patient not taking: Reported on 08/05/2017) 6 tablet 0   No facility-administered medications prior to visit.     Allergies  Allergen Reactions  . Clonidine Derivatives Palpitations and Other (See Comments)    VERY SEDATED  . Codeine Nausea Only  . Nickel Rash  . Sulfa Antibiotics Other (See Comments)    Reaction unknown    ROS As per HPI  PE: Blood pressure (!) 157/67, pulse 60, temperature (!) 97.4 F (36.3 C), temperature source Oral, height 4\' 11"  (1.499 m), SpO2 97 %. Gen: Alert, well appearing.  Patient is oriented to person, place, time, and situation. AFFECT: pleasant, lucid thought and speech. Approx 2-3 cm wound on vertex region of scalp with edges well approximated and no wound erythema or exudate. Two staples present---removed today w/out problem.  LABS:  Lab Results  Component Value Date   INR 1.8 08/04/2017   INR 1.9 07/21/2017   INR 2.7 07/07/2017   Lab Results  Component Value Date   WBC 9.9 02/18/2017   HGB 14.3 03/31/2017   HCT 42.0 03/31/2017   MCV 94.9 02/18/2017   PLT 171 02/18/2017     Chemistry      Component Value Date/Time   NA 135 03/31/2017 0643   NA 137 09/01/2016   K 4.0 03/31/2017 0643   CL 92 (L) 02/18/2017 0800   CO2 22 02/18/2017 0800   BUN 38 (H) 02/18/2017 0800   BUN 51 (A) 09/01/2016    CREATININE 4.08 (H) 02/18/2017 0800   CREATININE 1.63 (H) 03/24/2016 0110   GLU 128 09/01/2016      Component Value Date/Time   CALCIUM 8.7 (L) 02/18/2017 0800   CALCIUM 9.5 02/16/2017 0232   ALKPHOS 89 02/13/2017 1357   AST 34 02/13/2017 1357   ALT 23 02/13/2017 1357   BILITOT 0.6 02/13/2017 1357   BILITOT 0.2 06/17/2016 0819        IMPRESSION AND PLAN:  1) Scalp laceration: healing well, no sign of infection.  Her 2 staples were removed w/out problem today.  2) Hx of hypoxic resp failure: pt states she has not required oxygen in at least a couple of months and she requests that her oxygen be taken back by her medical supply company.  I agreed to write letter requesting that [facility] pick up her oxygen tanks/equipment from her home.  An After Visit Summary was printed and given to the patient.  FOLLOW UP: as needed  Signed:  Crissie Sickles, MD           08/05/2017

## 2017-08-05 NOTE — Telephone Encounter (Signed)
Valerie Terrell, CMA did this letter and I signed it.

## 2017-08-07 DIAGNOSIS — I4891 Unspecified atrial fibrillation: Secondary | ICD-10-CM | POA: Diagnosis not present

## 2017-08-07 DIAGNOSIS — N186 End stage renal disease: Secondary | ICD-10-CM | POA: Diagnosis not present

## 2017-08-07 DIAGNOSIS — D689 Coagulation defect, unspecified: Secondary | ICD-10-CM | POA: Diagnosis not present

## 2017-08-07 DIAGNOSIS — Z4901 Encounter for fitting and adjustment of extracorporeal dialysis catheter: Secondary | ICD-10-CM | POA: Diagnosis not present

## 2017-08-07 DIAGNOSIS — E876 Hypokalemia: Secondary | ICD-10-CM | POA: Diagnosis not present

## 2017-08-09 ENCOUNTER — Encounter: Payer: Self-pay | Admitting: Family Medicine

## 2017-08-10 DIAGNOSIS — I4891 Unspecified atrial fibrillation: Secondary | ICD-10-CM | POA: Diagnosis not present

## 2017-08-10 DIAGNOSIS — E876 Hypokalemia: Secondary | ICD-10-CM | POA: Diagnosis not present

## 2017-08-10 DIAGNOSIS — Z4901 Encounter for fitting and adjustment of extracorporeal dialysis catheter: Secondary | ICD-10-CM | POA: Diagnosis not present

## 2017-08-10 DIAGNOSIS — N186 End stage renal disease: Secondary | ICD-10-CM | POA: Diagnosis not present

## 2017-08-10 DIAGNOSIS — D689 Coagulation defect, unspecified: Secondary | ICD-10-CM | POA: Diagnosis not present

## 2017-08-10 LAB — PROTIME-INR: INR: 2 — AB (ref 0.9–1.1)

## 2017-08-12 DIAGNOSIS — I4891 Unspecified atrial fibrillation: Secondary | ICD-10-CM | POA: Diagnosis not present

## 2017-08-12 DIAGNOSIS — E876 Hypokalemia: Secondary | ICD-10-CM | POA: Diagnosis not present

## 2017-08-12 DIAGNOSIS — Z4901 Encounter for fitting and adjustment of extracorporeal dialysis catheter: Secondary | ICD-10-CM | POA: Diagnosis not present

## 2017-08-12 DIAGNOSIS — N186 End stage renal disease: Secondary | ICD-10-CM | POA: Diagnosis not present

## 2017-08-12 DIAGNOSIS — D689 Coagulation defect, unspecified: Secondary | ICD-10-CM | POA: Diagnosis not present

## 2017-08-14 DIAGNOSIS — E876 Hypokalemia: Secondary | ICD-10-CM | POA: Diagnosis not present

## 2017-08-14 DIAGNOSIS — N186 End stage renal disease: Secondary | ICD-10-CM | POA: Diagnosis not present

## 2017-08-14 DIAGNOSIS — I4891 Unspecified atrial fibrillation: Secondary | ICD-10-CM | POA: Diagnosis not present

## 2017-08-14 DIAGNOSIS — Z4901 Encounter for fitting and adjustment of extracorporeal dialysis catheter: Secondary | ICD-10-CM | POA: Diagnosis not present

## 2017-08-14 DIAGNOSIS — D689 Coagulation defect, unspecified: Secondary | ICD-10-CM | POA: Diagnosis not present

## 2017-08-16 DIAGNOSIS — N186 End stage renal disease: Secondary | ICD-10-CM | POA: Diagnosis not present

## 2017-08-16 DIAGNOSIS — Z992 Dependence on renal dialysis: Secondary | ICD-10-CM | POA: Diagnosis not present

## 2017-08-16 DIAGNOSIS — I120 Hypertensive chronic kidney disease with stage 5 chronic kidney disease or end stage renal disease: Secondary | ICD-10-CM | POA: Diagnosis not present

## 2017-08-16 DIAGNOSIS — N2581 Secondary hyperparathyroidism of renal origin: Secondary | ICD-10-CM | POA: Insufficient documentation

## 2017-08-17 DIAGNOSIS — Z23 Encounter for immunization: Secondary | ICD-10-CM | POA: Diagnosis not present

## 2017-08-17 DIAGNOSIS — N186 End stage renal disease: Secondary | ICD-10-CM | POA: Diagnosis not present

## 2017-08-17 DIAGNOSIS — D689 Coagulation defect, unspecified: Secondary | ICD-10-CM | POA: Diagnosis not present

## 2017-08-17 DIAGNOSIS — Z4901 Encounter for fitting and adjustment of extracorporeal dialysis catheter: Secondary | ICD-10-CM | POA: Diagnosis not present

## 2017-08-17 DIAGNOSIS — N2581 Secondary hyperparathyroidism of renal origin: Secondary | ICD-10-CM | POA: Diagnosis not present

## 2017-08-17 DIAGNOSIS — I4891 Unspecified atrial fibrillation: Secondary | ICD-10-CM | POA: Diagnosis not present

## 2017-08-17 DIAGNOSIS — E876 Hypokalemia: Secondary | ICD-10-CM | POA: Diagnosis not present

## 2017-08-17 LAB — PROTIME-INR: INR: 2.8 — AB (ref 0.9–1.1)

## 2017-08-18 ENCOUNTER — Other Ambulatory Visit: Payer: Self-pay | Admitting: Family Medicine

## 2017-08-18 ENCOUNTER — Ambulatory Visit (INDEPENDENT_AMBULATORY_CARE_PROVIDER_SITE_OTHER): Payer: Medicare Other | Admitting: Pharmacist Clinician (PhC)/ Clinical Pharmacy Specialist

## 2017-08-18 DIAGNOSIS — I480 Paroxysmal atrial fibrillation: Secondary | ICD-10-CM | POA: Diagnosis not present

## 2017-08-18 DIAGNOSIS — Z7901 Long term (current) use of anticoagulants: Secondary | ICD-10-CM

## 2017-08-18 DIAGNOSIS — I4891 Unspecified atrial fibrillation: Secondary | ICD-10-CM

## 2017-08-18 LAB — POCT INR: INR: 2.1

## 2017-08-19 DIAGNOSIS — D689 Coagulation defect, unspecified: Secondary | ICD-10-CM | POA: Diagnosis not present

## 2017-08-19 DIAGNOSIS — E876 Hypokalemia: Secondary | ICD-10-CM | POA: Diagnosis not present

## 2017-08-19 DIAGNOSIS — N186 End stage renal disease: Secondary | ICD-10-CM | POA: Diagnosis not present

## 2017-08-19 DIAGNOSIS — I4891 Unspecified atrial fibrillation: Secondary | ICD-10-CM | POA: Diagnosis not present

## 2017-08-19 DIAGNOSIS — Z23 Encounter for immunization: Secondary | ICD-10-CM | POA: Diagnosis not present

## 2017-08-19 DIAGNOSIS — N2581 Secondary hyperparathyroidism of renal origin: Secondary | ICD-10-CM | POA: Diagnosis not present

## 2017-08-19 DIAGNOSIS — Z4901 Encounter for fitting and adjustment of extracorporeal dialysis catheter: Secondary | ICD-10-CM | POA: Diagnosis not present

## 2017-08-21 DIAGNOSIS — D689 Coagulation defect, unspecified: Secondary | ICD-10-CM | POA: Diagnosis not present

## 2017-08-21 DIAGNOSIS — E876 Hypokalemia: Secondary | ICD-10-CM | POA: Diagnosis not present

## 2017-08-21 DIAGNOSIS — Z23 Encounter for immunization: Secondary | ICD-10-CM | POA: Diagnosis not present

## 2017-08-21 DIAGNOSIS — Z4901 Encounter for fitting and adjustment of extracorporeal dialysis catheter: Secondary | ICD-10-CM | POA: Diagnosis not present

## 2017-08-21 DIAGNOSIS — I4891 Unspecified atrial fibrillation: Secondary | ICD-10-CM | POA: Diagnosis not present

## 2017-08-21 DIAGNOSIS — N186 End stage renal disease: Secondary | ICD-10-CM | POA: Diagnosis not present

## 2017-08-21 DIAGNOSIS — N2581 Secondary hyperparathyroidism of renal origin: Secondary | ICD-10-CM | POA: Diagnosis not present

## 2017-08-24 DIAGNOSIS — I4891 Unspecified atrial fibrillation: Secondary | ICD-10-CM | POA: Diagnosis not present

## 2017-08-24 DIAGNOSIS — Z23 Encounter for immunization: Secondary | ICD-10-CM | POA: Diagnosis not present

## 2017-08-24 DIAGNOSIS — E876 Hypokalemia: Secondary | ICD-10-CM | POA: Diagnosis not present

## 2017-08-24 DIAGNOSIS — D689 Coagulation defect, unspecified: Secondary | ICD-10-CM | POA: Diagnosis not present

## 2017-08-24 DIAGNOSIS — N2581 Secondary hyperparathyroidism of renal origin: Secondary | ICD-10-CM | POA: Diagnosis not present

## 2017-08-24 DIAGNOSIS — N186 End stage renal disease: Secondary | ICD-10-CM | POA: Diagnosis not present

## 2017-08-24 DIAGNOSIS — Z4901 Encounter for fitting and adjustment of extracorporeal dialysis catheter: Secondary | ICD-10-CM | POA: Diagnosis not present

## 2017-08-26 DIAGNOSIS — Z23 Encounter for immunization: Secondary | ICD-10-CM | POA: Diagnosis not present

## 2017-08-26 DIAGNOSIS — Z4901 Encounter for fitting and adjustment of extracorporeal dialysis catheter: Secondary | ICD-10-CM | POA: Diagnosis not present

## 2017-08-26 DIAGNOSIS — D689 Coagulation defect, unspecified: Secondary | ICD-10-CM | POA: Diagnosis not present

## 2017-08-26 DIAGNOSIS — N2581 Secondary hyperparathyroidism of renal origin: Secondary | ICD-10-CM | POA: Diagnosis not present

## 2017-08-26 DIAGNOSIS — E876 Hypokalemia: Secondary | ICD-10-CM | POA: Diagnosis not present

## 2017-08-26 DIAGNOSIS — I4891 Unspecified atrial fibrillation: Secondary | ICD-10-CM | POA: Diagnosis not present

## 2017-08-26 DIAGNOSIS — N186 End stage renal disease: Secondary | ICD-10-CM | POA: Diagnosis not present

## 2017-08-28 DIAGNOSIS — N186 End stage renal disease: Secondary | ICD-10-CM | POA: Diagnosis not present

## 2017-08-28 DIAGNOSIS — E876 Hypokalemia: Secondary | ICD-10-CM | POA: Diagnosis not present

## 2017-08-28 DIAGNOSIS — N2581 Secondary hyperparathyroidism of renal origin: Secondary | ICD-10-CM | POA: Diagnosis not present

## 2017-08-28 DIAGNOSIS — I4891 Unspecified atrial fibrillation: Secondary | ICD-10-CM | POA: Diagnosis not present

## 2017-08-28 DIAGNOSIS — Z23 Encounter for immunization: Secondary | ICD-10-CM | POA: Diagnosis not present

## 2017-08-28 DIAGNOSIS — Z4901 Encounter for fitting and adjustment of extracorporeal dialysis catheter: Secondary | ICD-10-CM | POA: Diagnosis not present

## 2017-08-28 DIAGNOSIS — D689 Coagulation defect, unspecified: Secondary | ICD-10-CM | POA: Diagnosis not present

## 2017-08-31 DIAGNOSIS — Z4901 Encounter for fitting and adjustment of extracorporeal dialysis catheter: Secondary | ICD-10-CM | POA: Diagnosis not present

## 2017-08-31 DIAGNOSIS — D689 Coagulation defect, unspecified: Secondary | ICD-10-CM | POA: Diagnosis not present

## 2017-08-31 DIAGNOSIS — N2581 Secondary hyperparathyroidism of renal origin: Secondary | ICD-10-CM | POA: Diagnosis not present

## 2017-08-31 DIAGNOSIS — I4891 Unspecified atrial fibrillation: Secondary | ICD-10-CM | POA: Diagnosis not present

## 2017-08-31 DIAGNOSIS — Z23 Encounter for immunization: Secondary | ICD-10-CM | POA: Diagnosis not present

## 2017-08-31 DIAGNOSIS — N186 End stage renal disease: Secondary | ICD-10-CM | POA: Diagnosis not present

## 2017-08-31 DIAGNOSIS — E876 Hypokalemia: Secondary | ICD-10-CM | POA: Diagnosis not present

## 2017-09-02 ENCOUNTER — Telehealth: Payer: Self-pay | Admitting: Pharmacist Clinician (PhC)/ Clinical Pharmacy Specialist

## 2017-09-02 DIAGNOSIS — N2581 Secondary hyperparathyroidism of renal origin: Secondary | ICD-10-CM | POA: Diagnosis not present

## 2017-09-02 DIAGNOSIS — E876 Hypokalemia: Secondary | ICD-10-CM | POA: Diagnosis not present

## 2017-09-02 DIAGNOSIS — Z4901 Encounter for fitting and adjustment of extracorporeal dialysis catheter: Secondary | ICD-10-CM | POA: Diagnosis not present

## 2017-09-02 DIAGNOSIS — Z23 Encounter for immunization: Secondary | ICD-10-CM | POA: Diagnosis not present

## 2017-09-02 DIAGNOSIS — N186 End stage renal disease: Secondary | ICD-10-CM | POA: Diagnosis not present

## 2017-09-02 DIAGNOSIS — I4891 Unspecified atrial fibrillation: Secondary | ICD-10-CM | POA: Diagnosis not present

## 2017-09-02 DIAGNOSIS — D689 Coagulation defect, unspecified: Secondary | ICD-10-CM | POA: Diagnosis not present

## 2017-09-02 NOTE — Telephone Encounter (Signed)
Patient called from dialysis, states she is having bleeding problems with fistula today.  Wants to know if she should skip warfarin on dialysis days to prevent this.    Explained that this is not recommended because of the long half life of warfarin, and she would be mostly sub-therapeutic with no med three days per week.  Advised that if they have trouble getting bleeding to stop at dialysis, they will need to send her to the ER.  Patient voiced understanding.

## 2017-09-03 ENCOUNTER — Ambulatory Visit (INDEPENDENT_AMBULATORY_CARE_PROVIDER_SITE_OTHER): Payer: Medicare Other | Admitting: Pharmacist

## 2017-09-03 DIAGNOSIS — I4891 Unspecified atrial fibrillation: Secondary | ICD-10-CM | POA: Diagnosis not present

## 2017-09-03 DIAGNOSIS — Z7901 Long term (current) use of anticoagulants: Secondary | ICD-10-CM | POA: Diagnosis not present

## 2017-09-03 LAB — POCT INR: INR: 4.6

## 2017-09-04 DIAGNOSIS — D689 Coagulation defect, unspecified: Secondary | ICD-10-CM | POA: Diagnosis not present

## 2017-09-04 DIAGNOSIS — I4891 Unspecified atrial fibrillation: Secondary | ICD-10-CM | POA: Diagnosis not present

## 2017-09-04 DIAGNOSIS — E876 Hypokalemia: Secondary | ICD-10-CM | POA: Diagnosis not present

## 2017-09-04 DIAGNOSIS — N186 End stage renal disease: Secondary | ICD-10-CM | POA: Diagnosis not present

## 2017-09-04 DIAGNOSIS — N2581 Secondary hyperparathyroidism of renal origin: Secondary | ICD-10-CM | POA: Diagnosis not present

## 2017-09-04 DIAGNOSIS — Z4901 Encounter for fitting and adjustment of extracorporeal dialysis catheter: Secondary | ICD-10-CM | POA: Diagnosis not present

## 2017-09-04 DIAGNOSIS — Z23 Encounter for immunization: Secondary | ICD-10-CM | POA: Diagnosis not present

## 2017-09-07 DIAGNOSIS — Z4901 Encounter for fitting and adjustment of extracorporeal dialysis catheter: Secondary | ICD-10-CM | POA: Diagnosis not present

## 2017-09-07 DIAGNOSIS — E876 Hypokalemia: Secondary | ICD-10-CM | POA: Diagnosis not present

## 2017-09-07 DIAGNOSIS — I4891 Unspecified atrial fibrillation: Secondary | ICD-10-CM | POA: Diagnosis not present

## 2017-09-07 DIAGNOSIS — D689 Coagulation defect, unspecified: Secondary | ICD-10-CM | POA: Diagnosis not present

## 2017-09-07 DIAGNOSIS — Z23 Encounter for immunization: Secondary | ICD-10-CM | POA: Diagnosis not present

## 2017-09-07 DIAGNOSIS — N186 End stage renal disease: Secondary | ICD-10-CM | POA: Diagnosis not present

## 2017-09-07 DIAGNOSIS — N2581 Secondary hyperparathyroidism of renal origin: Secondary | ICD-10-CM | POA: Diagnosis not present

## 2017-09-08 ENCOUNTER — Ambulatory Visit (INDEPENDENT_AMBULATORY_CARE_PROVIDER_SITE_OTHER): Payer: Medicare Other | Admitting: *Deleted

## 2017-09-08 DIAGNOSIS — I495 Sick sinus syndrome: Secondary | ICD-10-CM

## 2017-09-08 NOTE — Progress Notes (Signed)
Remote pacemaker transmission.   

## 2017-09-09 DIAGNOSIS — I4891 Unspecified atrial fibrillation: Secondary | ICD-10-CM | POA: Diagnosis not present

## 2017-09-09 DIAGNOSIS — D689 Coagulation defect, unspecified: Secondary | ICD-10-CM | POA: Diagnosis not present

## 2017-09-09 DIAGNOSIS — E876 Hypokalemia: Secondary | ICD-10-CM | POA: Diagnosis not present

## 2017-09-09 DIAGNOSIS — Z4901 Encounter for fitting and adjustment of extracorporeal dialysis catheter: Secondary | ICD-10-CM | POA: Diagnosis not present

## 2017-09-09 DIAGNOSIS — N186 End stage renal disease: Secondary | ICD-10-CM | POA: Diagnosis not present

## 2017-09-09 DIAGNOSIS — Z23 Encounter for immunization: Secondary | ICD-10-CM | POA: Diagnosis not present

## 2017-09-09 DIAGNOSIS — N2581 Secondary hyperparathyroidism of renal origin: Secondary | ICD-10-CM | POA: Diagnosis not present

## 2017-09-09 LAB — CUP PACEART REMOTE DEVICE CHECK
Implantable Lead Implant Date: 20180719
Implantable Lead Location: 753859
MDC IDC LEAD IMPLANT DT: 20180719
MDC IDC LEAD LOCATION: 753860
MDC IDC PG IMPLANT DT: 20180719
MDC IDC SESS DTM: 20190425141308
Pulse Gen Serial Number: 8918511

## 2017-09-10 ENCOUNTER — Encounter: Payer: Self-pay | Admitting: Cardiology

## 2017-09-11 DIAGNOSIS — N2581 Secondary hyperparathyroidism of renal origin: Secondary | ICD-10-CM | POA: Diagnosis not present

## 2017-09-11 DIAGNOSIS — Z23 Encounter for immunization: Secondary | ICD-10-CM | POA: Diagnosis not present

## 2017-09-11 DIAGNOSIS — Z4901 Encounter for fitting and adjustment of extracorporeal dialysis catheter: Secondary | ICD-10-CM | POA: Diagnosis not present

## 2017-09-11 DIAGNOSIS — D689 Coagulation defect, unspecified: Secondary | ICD-10-CM | POA: Diagnosis not present

## 2017-09-11 DIAGNOSIS — E876 Hypokalemia: Secondary | ICD-10-CM | POA: Diagnosis not present

## 2017-09-11 DIAGNOSIS — I4891 Unspecified atrial fibrillation: Secondary | ICD-10-CM | POA: Diagnosis not present

## 2017-09-11 DIAGNOSIS — N186 End stage renal disease: Secondary | ICD-10-CM | POA: Diagnosis not present

## 2017-09-14 DIAGNOSIS — I4891 Unspecified atrial fibrillation: Secondary | ICD-10-CM | POA: Diagnosis not present

## 2017-09-14 DIAGNOSIS — Z23 Encounter for immunization: Secondary | ICD-10-CM | POA: Diagnosis not present

## 2017-09-14 DIAGNOSIS — Z4901 Encounter for fitting and adjustment of extracorporeal dialysis catheter: Secondary | ICD-10-CM | POA: Diagnosis not present

## 2017-09-14 DIAGNOSIS — E876 Hypokalemia: Secondary | ICD-10-CM | POA: Diagnosis not present

## 2017-09-14 DIAGNOSIS — D689 Coagulation defect, unspecified: Secondary | ICD-10-CM | POA: Diagnosis not present

## 2017-09-14 DIAGNOSIS — N2581 Secondary hyperparathyroidism of renal origin: Secondary | ICD-10-CM | POA: Diagnosis not present

## 2017-09-14 DIAGNOSIS — N186 End stage renal disease: Secondary | ICD-10-CM | POA: Diagnosis not present

## 2017-09-15 ENCOUNTER — Ambulatory Visit (INDEPENDENT_AMBULATORY_CARE_PROVIDER_SITE_OTHER): Payer: Medicare Other | Admitting: Pharmacist

## 2017-09-15 DIAGNOSIS — M48061 Spinal stenosis, lumbar region without neurogenic claudication: Secondary | ICD-10-CM | POA: Diagnosis not present

## 2017-09-15 DIAGNOSIS — Z7901 Long term (current) use of anticoagulants: Secondary | ICD-10-CM

## 2017-09-15 DIAGNOSIS — I4891 Unspecified atrial fibrillation: Secondary | ICD-10-CM

## 2017-09-15 LAB — POCT INR: INR: 3.3

## 2017-09-16 DIAGNOSIS — E876 Hypokalemia: Secondary | ICD-10-CM | POA: Diagnosis not present

## 2017-09-16 DIAGNOSIS — Z4901 Encounter for fitting and adjustment of extracorporeal dialysis catheter: Secondary | ICD-10-CM | POA: Diagnosis not present

## 2017-09-16 DIAGNOSIS — E875 Hyperkalemia: Secondary | ICD-10-CM | POA: Diagnosis not present

## 2017-09-16 DIAGNOSIS — N2581 Secondary hyperparathyroidism of renal origin: Secondary | ICD-10-CM | POA: Diagnosis not present

## 2017-09-16 DIAGNOSIS — N186 End stage renal disease: Secondary | ICD-10-CM | POA: Diagnosis not present

## 2017-09-16 DIAGNOSIS — D631 Anemia in chronic kidney disease: Secondary | ICD-10-CM | POA: Diagnosis not present

## 2017-09-16 DIAGNOSIS — D689 Coagulation defect, unspecified: Secondary | ICD-10-CM | POA: Diagnosis not present

## 2017-09-18 DIAGNOSIS — E876 Hypokalemia: Secondary | ICD-10-CM | POA: Diagnosis not present

## 2017-09-18 DIAGNOSIS — D631 Anemia in chronic kidney disease: Secondary | ICD-10-CM | POA: Diagnosis not present

## 2017-09-18 DIAGNOSIS — Z4901 Encounter for fitting and adjustment of extracorporeal dialysis catheter: Secondary | ICD-10-CM | POA: Diagnosis not present

## 2017-09-18 DIAGNOSIS — N186 End stage renal disease: Secondary | ICD-10-CM | POA: Diagnosis not present

## 2017-09-18 DIAGNOSIS — E875 Hyperkalemia: Secondary | ICD-10-CM | POA: Diagnosis not present

## 2017-09-18 DIAGNOSIS — N2581 Secondary hyperparathyroidism of renal origin: Secondary | ICD-10-CM | POA: Diagnosis not present

## 2017-09-18 DIAGNOSIS — D689 Coagulation defect, unspecified: Secondary | ICD-10-CM | POA: Diagnosis not present

## 2017-09-19 ENCOUNTER — Other Ambulatory Visit: Payer: Self-pay | Admitting: Cardiovascular Disease

## 2017-09-20 NOTE — Telephone Encounter (Signed)
REFILL 

## 2017-09-21 DIAGNOSIS — E876 Hypokalemia: Secondary | ICD-10-CM | POA: Diagnosis not present

## 2017-09-21 DIAGNOSIS — Z4901 Encounter for fitting and adjustment of extracorporeal dialysis catheter: Secondary | ICD-10-CM | POA: Diagnosis not present

## 2017-09-21 DIAGNOSIS — E875 Hyperkalemia: Secondary | ICD-10-CM | POA: Diagnosis not present

## 2017-09-21 DIAGNOSIS — N2581 Secondary hyperparathyroidism of renal origin: Secondary | ICD-10-CM | POA: Diagnosis not present

## 2017-09-21 DIAGNOSIS — D689 Coagulation defect, unspecified: Secondary | ICD-10-CM | POA: Diagnosis not present

## 2017-09-21 DIAGNOSIS — N186 End stage renal disease: Secondary | ICD-10-CM | POA: Diagnosis not present

## 2017-09-21 DIAGNOSIS — D631 Anemia in chronic kidney disease: Secondary | ICD-10-CM | POA: Diagnosis not present

## 2017-09-23 DIAGNOSIS — Z4901 Encounter for fitting and adjustment of extracorporeal dialysis catheter: Secondary | ICD-10-CM | POA: Diagnosis not present

## 2017-09-23 DIAGNOSIS — N186 End stage renal disease: Secondary | ICD-10-CM | POA: Diagnosis not present

## 2017-09-23 DIAGNOSIS — E875 Hyperkalemia: Secondary | ICD-10-CM | POA: Diagnosis not present

## 2017-09-23 DIAGNOSIS — E876 Hypokalemia: Secondary | ICD-10-CM | POA: Diagnosis not present

## 2017-09-23 DIAGNOSIS — D689 Coagulation defect, unspecified: Secondary | ICD-10-CM | POA: Diagnosis not present

## 2017-09-23 DIAGNOSIS — D631 Anemia in chronic kidney disease: Secondary | ICD-10-CM | POA: Diagnosis not present

## 2017-09-23 DIAGNOSIS — N2581 Secondary hyperparathyroidism of renal origin: Secondary | ICD-10-CM | POA: Diagnosis not present

## 2017-09-24 DIAGNOSIS — M5416 Radiculopathy, lumbar region: Secondary | ICD-10-CM | POA: Diagnosis not present

## 2017-09-24 DIAGNOSIS — M5136 Other intervertebral disc degeneration, lumbar region: Secondary | ICD-10-CM | POA: Diagnosis not present

## 2017-09-24 DIAGNOSIS — M5116 Intervertebral disc disorders with radiculopathy, lumbar region: Secondary | ICD-10-CM | POA: Diagnosis not present

## 2017-09-25 DIAGNOSIS — N186 End stage renal disease: Secondary | ICD-10-CM | POA: Diagnosis not present

## 2017-09-25 DIAGNOSIS — Z4901 Encounter for fitting and adjustment of extracorporeal dialysis catheter: Secondary | ICD-10-CM | POA: Diagnosis not present

## 2017-09-25 DIAGNOSIS — E875 Hyperkalemia: Secondary | ICD-10-CM | POA: Diagnosis not present

## 2017-09-25 DIAGNOSIS — E876 Hypokalemia: Secondary | ICD-10-CM | POA: Diagnosis not present

## 2017-09-25 DIAGNOSIS — D689 Coagulation defect, unspecified: Secondary | ICD-10-CM | POA: Diagnosis not present

## 2017-09-25 DIAGNOSIS — N2581 Secondary hyperparathyroidism of renal origin: Secondary | ICD-10-CM | POA: Diagnosis not present

## 2017-09-25 DIAGNOSIS — D631 Anemia in chronic kidney disease: Secondary | ICD-10-CM | POA: Diagnosis not present

## 2017-09-28 DIAGNOSIS — D689 Coagulation defect, unspecified: Secondary | ICD-10-CM | POA: Diagnosis not present

## 2017-09-28 DIAGNOSIS — E875 Hyperkalemia: Secondary | ICD-10-CM | POA: Diagnosis not present

## 2017-09-28 DIAGNOSIS — N2581 Secondary hyperparathyroidism of renal origin: Secondary | ICD-10-CM | POA: Diagnosis not present

## 2017-09-28 DIAGNOSIS — D631 Anemia in chronic kidney disease: Secondary | ICD-10-CM | POA: Diagnosis not present

## 2017-09-28 DIAGNOSIS — E876 Hypokalemia: Secondary | ICD-10-CM | POA: Diagnosis not present

## 2017-09-28 DIAGNOSIS — N186 End stage renal disease: Secondary | ICD-10-CM | POA: Diagnosis not present

## 2017-09-28 DIAGNOSIS — Z4901 Encounter for fitting and adjustment of extracorporeal dialysis catheter: Secondary | ICD-10-CM | POA: Diagnosis not present

## 2017-09-29 ENCOUNTER — Ambulatory Visit (INDEPENDENT_AMBULATORY_CARE_PROVIDER_SITE_OTHER): Payer: Medicare Other | Admitting: Pharmacist Clinician (PhC)/ Clinical Pharmacy Specialist

## 2017-09-29 DIAGNOSIS — I4891 Unspecified atrial fibrillation: Secondary | ICD-10-CM | POA: Diagnosis not present

## 2017-09-29 DIAGNOSIS — Z7901 Long term (current) use of anticoagulants: Secondary | ICD-10-CM | POA: Diagnosis not present

## 2017-09-29 LAB — POCT INR: INR: 3.7

## 2017-09-30 DIAGNOSIS — N186 End stage renal disease: Secondary | ICD-10-CM | POA: Diagnosis not present

## 2017-09-30 DIAGNOSIS — Z4901 Encounter for fitting and adjustment of extracorporeal dialysis catheter: Secondary | ICD-10-CM | POA: Diagnosis not present

## 2017-09-30 DIAGNOSIS — E876 Hypokalemia: Secondary | ICD-10-CM | POA: Diagnosis not present

## 2017-09-30 DIAGNOSIS — D689 Coagulation defect, unspecified: Secondary | ICD-10-CM | POA: Diagnosis not present

## 2017-09-30 DIAGNOSIS — D631 Anemia in chronic kidney disease: Secondary | ICD-10-CM | POA: Diagnosis not present

## 2017-09-30 DIAGNOSIS — N2581 Secondary hyperparathyroidism of renal origin: Secondary | ICD-10-CM | POA: Diagnosis not present

## 2017-09-30 DIAGNOSIS — E875 Hyperkalemia: Secondary | ICD-10-CM | POA: Diagnosis not present

## 2017-10-02 DIAGNOSIS — E875 Hyperkalemia: Secondary | ICD-10-CM | POA: Diagnosis not present

## 2017-10-02 DIAGNOSIS — E876 Hypokalemia: Secondary | ICD-10-CM | POA: Diagnosis not present

## 2017-10-02 DIAGNOSIS — N186 End stage renal disease: Secondary | ICD-10-CM | POA: Diagnosis not present

## 2017-10-02 DIAGNOSIS — D689 Coagulation defect, unspecified: Secondary | ICD-10-CM | POA: Diagnosis not present

## 2017-10-02 DIAGNOSIS — D631 Anemia in chronic kidney disease: Secondary | ICD-10-CM | POA: Diagnosis not present

## 2017-10-02 DIAGNOSIS — N2581 Secondary hyperparathyroidism of renal origin: Secondary | ICD-10-CM | POA: Diagnosis not present

## 2017-10-02 DIAGNOSIS — Z4901 Encounter for fitting and adjustment of extracorporeal dialysis catheter: Secondary | ICD-10-CM | POA: Diagnosis not present

## 2017-10-05 ENCOUNTER — Encounter: Payer: Self-pay | Admitting: Family Medicine

## 2017-10-05 DIAGNOSIS — D689 Coagulation defect, unspecified: Secondary | ICD-10-CM | POA: Diagnosis not present

## 2017-10-05 DIAGNOSIS — D631 Anemia in chronic kidney disease: Secondary | ICD-10-CM | POA: Diagnosis not present

## 2017-10-05 DIAGNOSIS — N186 End stage renal disease: Secondary | ICD-10-CM | POA: Diagnosis not present

## 2017-10-05 DIAGNOSIS — E876 Hypokalemia: Secondary | ICD-10-CM | POA: Diagnosis not present

## 2017-10-05 DIAGNOSIS — Z4901 Encounter for fitting and adjustment of extracorporeal dialysis catheter: Secondary | ICD-10-CM | POA: Diagnosis not present

## 2017-10-05 DIAGNOSIS — N2581 Secondary hyperparathyroidism of renal origin: Secondary | ICD-10-CM | POA: Diagnosis not present

## 2017-10-05 DIAGNOSIS — E875 Hyperkalemia: Secondary | ICD-10-CM | POA: Diagnosis not present

## 2017-10-07 DIAGNOSIS — Z4901 Encounter for fitting and adjustment of extracorporeal dialysis catheter: Secondary | ICD-10-CM | POA: Diagnosis not present

## 2017-10-07 DIAGNOSIS — D631 Anemia in chronic kidney disease: Secondary | ICD-10-CM | POA: Diagnosis not present

## 2017-10-07 DIAGNOSIS — E876 Hypokalemia: Secondary | ICD-10-CM | POA: Diagnosis not present

## 2017-10-07 DIAGNOSIS — N186 End stage renal disease: Secondary | ICD-10-CM | POA: Diagnosis not present

## 2017-10-07 DIAGNOSIS — D689 Coagulation defect, unspecified: Secondary | ICD-10-CM | POA: Diagnosis not present

## 2017-10-07 DIAGNOSIS — E875 Hyperkalemia: Secondary | ICD-10-CM | POA: Diagnosis not present

## 2017-10-07 DIAGNOSIS — N2581 Secondary hyperparathyroidism of renal origin: Secondary | ICD-10-CM | POA: Diagnosis not present

## 2017-10-09 DIAGNOSIS — N186 End stage renal disease: Secondary | ICD-10-CM | POA: Diagnosis not present

## 2017-10-09 DIAGNOSIS — N2581 Secondary hyperparathyroidism of renal origin: Secondary | ICD-10-CM | POA: Diagnosis not present

## 2017-10-09 DIAGNOSIS — E876 Hypokalemia: Secondary | ICD-10-CM | POA: Diagnosis not present

## 2017-10-09 DIAGNOSIS — D689 Coagulation defect, unspecified: Secondary | ICD-10-CM | POA: Diagnosis not present

## 2017-10-09 DIAGNOSIS — D631 Anemia in chronic kidney disease: Secondary | ICD-10-CM | POA: Diagnosis not present

## 2017-10-09 DIAGNOSIS — E875 Hyperkalemia: Secondary | ICD-10-CM | POA: Diagnosis not present

## 2017-10-09 DIAGNOSIS — Z4901 Encounter for fitting and adjustment of extracorporeal dialysis catheter: Secondary | ICD-10-CM | POA: Diagnosis not present

## 2017-10-12 DIAGNOSIS — N2581 Secondary hyperparathyroidism of renal origin: Secondary | ICD-10-CM | POA: Diagnosis not present

## 2017-10-12 DIAGNOSIS — D631 Anemia in chronic kidney disease: Secondary | ICD-10-CM | POA: Diagnosis not present

## 2017-10-12 DIAGNOSIS — E876 Hypokalemia: Secondary | ICD-10-CM | POA: Diagnosis not present

## 2017-10-12 DIAGNOSIS — E875 Hyperkalemia: Secondary | ICD-10-CM | POA: Diagnosis not present

## 2017-10-12 DIAGNOSIS — N186 End stage renal disease: Secondary | ICD-10-CM | POA: Diagnosis not present

## 2017-10-12 DIAGNOSIS — D689 Coagulation defect, unspecified: Secondary | ICD-10-CM | POA: Diagnosis not present

## 2017-10-12 DIAGNOSIS — Z4901 Encounter for fitting and adjustment of extracorporeal dialysis catheter: Secondary | ICD-10-CM | POA: Diagnosis not present

## 2017-10-14 DIAGNOSIS — Z4901 Encounter for fitting and adjustment of extracorporeal dialysis catheter: Secondary | ICD-10-CM | POA: Diagnosis not present

## 2017-10-14 DIAGNOSIS — D689 Coagulation defect, unspecified: Secondary | ICD-10-CM | POA: Diagnosis not present

## 2017-10-14 DIAGNOSIS — E875 Hyperkalemia: Secondary | ICD-10-CM | POA: Diagnosis not present

## 2017-10-14 DIAGNOSIS — N186 End stage renal disease: Secondary | ICD-10-CM | POA: Diagnosis not present

## 2017-10-14 DIAGNOSIS — D631 Anemia in chronic kidney disease: Secondary | ICD-10-CM | POA: Diagnosis not present

## 2017-10-14 DIAGNOSIS — N2581 Secondary hyperparathyroidism of renal origin: Secondary | ICD-10-CM | POA: Diagnosis not present

## 2017-10-14 DIAGNOSIS — E876 Hypokalemia: Secondary | ICD-10-CM | POA: Diagnosis not present

## 2017-10-15 DIAGNOSIS — Z992 Dependence on renal dialysis: Secondary | ICD-10-CM | POA: Diagnosis not present

## 2017-10-15 DIAGNOSIS — I120 Hypertensive chronic kidney disease with stage 5 chronic kidney disease or end stage renal disease: Secondary | ICD-10-CM | POA: Diagnosis not present

## 2017-10-15 DIAGNOSIS — N186 End stage renal disease: Secondary | ICD-10-CM | POA: Diagnosis not present

## 2017-10-16 DIAGNOSIS — N186 End stage renal disease: Secondary | ICD-10-CM | POA: Diagnosis not present

## 2017-10-16 DIAGNOSIS — E876 Hypokalemia: Secondary | ICD-10-CM | POA: Diagnosis not present

## 2017-10-16 DIAGNOSIS — Z4901 Encounter for fitting and adjustment of extracorporeal dialysis catheter: Secondary | ICD-10-CM | POA: Diagnosis not present

## 2017-10-16 DIAGNOSIS — D631 Anemia in chronic kidney disease: Secondary | ICD-10-CM | POA: Diagnosis not present

## 2017-10-16 DIAGNOSIS — N2581 Secondary hyperparathyroidism of renal origin: Secondary | ICD-10-CM | POA: Diagnosis not present

## 2017-10-16 DIAGNOSIS — D689 Coagulation defect, unspecified: Secondary | ICD-10-CM | POA: Diagnosis not present

## 2017-10-19 DIAGNOSIS — N2581 Secondary hyperparathyroidism of renal origin: Secondary | ICD-10-CM | POA: Diagnosis not present

## 2017-10-19 DIAGNOSIS — N186 End stage renal disease: Secondary | ICD-10-CM | POA: Diagnosis not present

## 2017-10-19 DIAGNOSIS — D631 Anemia in chronic kidney disease: Secondary | ICD-10-CM | POA: Diagnosis not present

## 2017-10-19 DIAGNOSIS — E876 Hypokalemia: Secondary | ICD-10-CM | POA: Diagnosis not present

## 2017-10-19 DIAGNOSIS — D689 Coagulation defect, unspecified: Secondary | ICD-10-CM | POA: Diagnosis not present

## 2017-10-19 DIAGNOSIS — Z4901 Encounter for fitting and adjustment of extracorporeal dialysis catheter: Secondary | ICD-10-CM | POA: Diagnosis not present

## 2017-10-21 DIAGNOSIS — D689 Coagulation defect, unspecified: Secondary | ICD-10-CM | POA: Diagnosis not present

## 2017-10-21 DIAGNOSIS — Z4901 Encounter for fitting and adjustment of extracorporeal dialysis catheter: Secondary | ICD-10-CM | POA: Diagnosis not present

## 2017-10-21 DIAGNOSIS — D631 Anemia in chronic kidney disease: Secondary | ICD-10-CM | POA: Diagnosis not present

## 2017-10-21 DIAGNOSIS — E876 Hypokalemia: Secondary | ICD-10-CM | POA: Diagnosis not present

## 2017-10-21 DIAGNOSIS — N186 End stage renal disease: Secondary | ICD-10-CM | POA: Diagnosis not present

## 2017-10-21 DIAGNOSIS — N2581 Secondary hyperparathyroidism of renal origin: Secondary | ICD-10-CM | POA: Diagnosis not present

## 2017-10-22 ENCOUNTER — Ambulatory Visit (INDEPENDENT_AMBULATORY_CARE_PROVIDER_SITE_OTHER): Payer: Medicare Other | Admitting: Pharmacist Clinician (PhC)/ Clinical Pharmacy Specialist

## 2017-10-22 DIAGNOSIS — Z7901 Long term (current) use of anticoagulants: Secondary | ICD-10-CM | POA: Diagnosis not present

## 2017-10-22 DIAGNOSIS — I639 Cerebral infarction, unspecified: Secondary | ICD-10-CM | POA: Diagnosis not present

## 2017-10-22 DIAGNOSIS — I4891 Unspecified atrial fibrillation: Secondary | ICD-10-CM | POA: Diagnosis not present

## 2017-10-22 LAB — POCT INR: INR: 2.3 (ref 2.0–3.0)

## 2017-10-22 NOTE — Patient Instructions (Signed)
Description   Continue with 1/2 tablet daily except 1 tablet every Monday and Friday,  Repeat INR in 4 weeks.

## 2017-10-23 DIAGNOSIS — E876 Hypokalemia: Secondary | ICD-10-CM | POA: Diagnosis not present

## 2017-10-23 DIAGNOSIS — D631 Anemia in chronic kidney disease: Secondary | ICD-10-CM | POA: Diagnosis not present

## 2017-10-23 DIAGNOSIS — Z4901 Encounter for fitting and adjustment of extracorporeal dialysis catheter: Secondary | ICD-10-CM | POA: Diagnosis not present

## 2017-10-23 DIAGNOSIS — N186 End stage renal disease: Secondary | ICD-10-CM | POA: Diagnosis not present

## 2017-10-23 DIAGNOSIS — D689 Coagulation defect, unspecified: Secondary | ICD-10-CM | POA: Diagnosis not present

## 2017-10-23 DIAGNOSIS — N2581 Secondary hyperparathyroidism of renal origin: Secondary | ICD-10-CM | POA: Diagnosis not present

## 2017-10-26 DIAGNOSIS — D631 Anemia in chronic kidney disease: Secondary | ICD-10-CM | POA: Diagnosis not present

## 2017-10-26 DIAGNOSIS — N2581 Secondary hyperparathyroidism of renal origin: Secondary | ICD-10-CM | POA: Diagnosis not present

## 2017-10-26 DIAGNOSIS — E876 Hypokalemia: Secondary | ICD-10-CM | POA: Diagnosis not present

## 2017-10-26 DIAGNOSIS — N186 End stage renal disease: Secondary | ICD-10-CM | POA: Diagnosis not present

## 2017-10-26 DIAGNOSIS — Z4901 Encounter for fitting and adjustment of extracorporeal dialysis catheter: Secondary | ICD-10-CM | POA: Diagnosis not present

## 2017-10-26 DIAGNOSIS — D689 Coagulation defect, unspecified: Secondary | ICD-10-CM | POA: Diagnosis not present

## 2017-10-28 DIAGNOSIS — Z4901 Encounter for fitting and adjustment of extracorporeal dialysis catheter: Secondary | ICD-10-CM | POA: Diagnosis not present

## 2017-10-28 DIAGNOSIS — N186 End stage renal disease: Secondary | ICD-10-CM | POA: Diagnosis not present

## 2017-10-28 DIAGNOSIS — D689 Coagulation defect, unspecified: Secondary | ICD-10-CM | POA: Diagnosis not present

## 2017-10-28 DIAGNOSIS — E876 Hypokalemia: Secondary | ICD-10-CM | POA: Diagnosis not present

## 2017-10-28 DIAGNOSIS — D631 Anemia in chronic kidney disease: Secondary | ICD-10-CM | POA: Diagnosis not present

## 2017-10-28 DIAGNOSIS — N2581 Secondary hyperparathyroidism of renal origin: Secondary | ICD-10-CM | POA: Diagnosis not present

## 2017-10-30 DIAGNOSIS — D631 Anemia in chronic kidney disease: Secondary | ICD-10-CM | POA: Diagnosis not present

## 2017-10-30 DIAGNOSIS — E876 Hypokalemia: Secondary | ICD-10-CM | POA: Diagnosis not present

## 2017-10-30 DIAGNOSIS — N2581 Secondary hyperparathyroidism of renal origin: Secondary | ICD-10-CM | POA: Diagnosis not present

## 2017-10-30 DIAGNOSIS — Z4901 Encounter for fitting and adjustment of extracorporeal dialysis catheter: Secondary | ICD-10-CM | POA: Diagnosis not present

## 2017-10-30 DIAGNOSIS — D689 Coagulation defect, unspecified: Secondary | ICD-10-CM | POA: Diagnosis not present

## 2017-10-30 DIAGNOSIS — N186 End stage renal disease: Secondary | ICD-10-CM | POA: Diagnosis not present

## 2017-11-02 DIAGNOSIS — Z4901 Encounter for fitting and adjustment of extracorporeal dialysis catheter: Secondary | ICD-10-CM | POA: Diagnosis not present

## 2017-11-02 DIAGNOSIS — D689 Coagulation defect, unspecified: Secondary | ICD-10-CM | POA: Diagnosis not present

## 2017-11-02 DIAGNOSIS — N186 End stage renal disease: Secondary | ICD-10-CM | POA: Diagnosis not present

## 2017-11-02 DIAGNOSIS — E876 Hypokalemia: Secondary | ICD-10-CM | POA: Diagnosis not present

## 2017-11-02 DIAGNOSIS — N2581 Secondary hyperparathyroidism of renal origin: Secondary | ICD-10-CM | POA: Diagnosis not present

## 2017-11-02 DIAGNOSIS — D631 Anemia in chronic kidney disease: Secondary | ICD-10-CM | POA: Diagnosis not present

## 2017-11-04 DIAGNOSIS — N186 End stage renal disease: Secondary | ICD-10-CM | POA: Diagnosis not present

## 2017-11-04 DIAGNOSIS — N2581 Secondary hyperparathyroidism of renal origin: Secondary | ICD-10-CM | POA: Diagnosis not present

## 2017-11-04 DIAGNOSIS — D689 Coagulation defect, unspecified: Secondary | ICD-10-CM | POA: Diagnosis not present

## 2017-11-04 DIAGNOSIS — D631 Anemia in chronic kidney disease: Secondary | ICD-10-CM | POA: Diagnosis not present

## 2017-11-04 DIAGNOSIS — E876 Hypokalemia: Secondary | ICD-10-CM | POA: Diagnosis not present

## 2017-11-04 DIAGNOSIS — Z4901 Encounter for fitting and adjustment of extracorporeal dialysis catheter: Secondary | ICD-10-CM | POA: Diagnosis not present

## 2017-11-06 DIAGNOSIS — D689 Coagulation defect, unspecified: Secondary | ICD-10-CM | POA: Diagnosis not present

## 2017-11-06 DIAGNOSIS — D631 Anemia in chronic kidney disease: Secondary | ICD-10-CM | POA: Diagnosis not present

## 2017-11-06 DIAGNOSIS — N186 End stage renal disease: Secondary | ICD-10-CM | POA: Diagnosis not present

## 2017-11-06 DIAGNOSIS — N2581 Secondary hyperparathyroidism of renal origin: Secondary | ICD-10-CM | POA: Diagnosis not present

## 2017-11-06 DIAGNOSIS — Z4901 Encounter for fitting and adjustment of extracorporeal dialysis catheter: Secondary | ICD-10-CM | POA: Diagnosis not present

## 2017-11-06 DIAGNOSIS — E876 Hypokalemia: Secondary | ICD-10-CM | POA: Diagnosis not present

## 2017-11-09 ENCOUNTER — Encounter: Payer: Self-pay | Admitting: Vascular Surgery

## 2017-11-09 ENCOUNTER — Ambulatory Visit (INDEPENDENT_AMBULATORY_CARE_PROVIDER_SITE_OTHER): Payer: Medicare Other | Admitting: Vascular Surgery

## 2017-11-09 ENCOUNTER — Encounter: Payer: Self-pay | Admitting: *Deleted

## 2017-11-09 VITALS — BP 176/70 | HR 76 | Temp 97.0°F | Ht 59.0 in | Wt 117.0 lb

## 2017-11-09 DIAGNOSIS — D689 Coagulation defect, unspecified: Secondary | ICD-10-CM | POA: Diagnosis not present

## 2017-11-09 DIAGNOSIS — D631 Anemia in chronic kidney disease: Secondary | ICD-10-CM | POA: Diagnosis not present

## 2017-11-09 DIAGNOSIS — N2581 Secondary hyperparathyroidism of renal origin: Secondary | ICD-10-CM | POA: Diagnosis not present

## 2017-11-09 DIAGNOSIS — Z4901 Encounter for fitting and adjustment of extracorporeal dialysis catheter: Secondary | ICD-10-CM | POA: Diagnosis not present

## 2017-11-09 DIAGNOSIS — Z992 Dependence on renal dialysis: Secondary | ICD-10-CM

## 2017-11-09 DIAGNOSIS — E876 Hypokalemia: Secondary | ICD-10-CM | POA: Diagnosis not present

## 2017-11-09 DIAGNOSIS — N186 End stage renal disease: Secondary | ICD-10-CM

## 2017-11-09 NOTE — H&P (View-Only) (Signed)
[facility] [place]  Patient name: Valerie Terrell MRN: [id number] DOB: 02-Nov-1932 Sex: female  REASON FOR VISIT: [place] access for hemodialysis  HPI: Valerie Terrell is a 84 y.o. female here today for discussion of access for hemodialysis.  She currently is being dialyzed via a right IJ tunnel catheter which was placed in September 2018.  She had undergone a basilic vein transposition is a single stage by Dr. Bridgett Larsson on 03/31/2017.  She has had unsuccessful attempts at using this with infiltration and also has had angioplasty at [facility].  Past Medical History:  Diagnosis Date  . Anemia of chronic disease 2018   + anemia of CRI?  [name] Atrophy of left kidney    with absent blood flow by renal artery dopplers (Dr. Gwenlyn Found)  . 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 Chronic combined systolic and diastolic CHF (congestive heart failure) ([place])    a. 11/2016: echo showing EF of 35-40%, RV strain noted, mild MR and mild TR.   [name] Chronic hypoxemic respiratory failure ([place])   . Debilitated patient    WC dependent as of 2018  . Dysrhythmia    afib  . ESRD on hemodialysis ([place]) 01/2017   T/Th/Sat schedule- [place].  Right basilic AV fistula 68/1275.  . Family history of adverse reaction to anesthesia    Daughter- nausea  . History of adenomatous polyp of colon   . History of kidney stones    passed  . History of subarachnoid hemorrhage 10/2014   after syncope and while on xarelto  . HOH (hard of hearing)   . Hyperlipidemia   . Hypertension    Difficult to control, in the setting of one functioning kidney: pt was referred to nephrology by Dr. Gwenlyn Found 06/2015.  . Lumbar radiculopathy 2012  . Lumbar spinal stenosis 2019   facet injections only very short term relief.  As of 09/2017 ortho f/u, selective nerve root block planned.  . Lumbar spondylosis    MR 07/2016---no sign of spinal nerve compression or cord compression.  Pt  set up with outpt ortho while admitted to hosp 07/2016.  . Malnourished ([place])   . Metatarsal fracture 06/10/2016   Nondisplaced, left 5th metatarsal--pt was referred to ortho  . Nonischemic cardiomyopathy ([place])   . Osteopenia 2014   T-score -2.1  . PAF (paroxysmal atrial fibrillation) (HCC)    Eliquis started after BRAO and CVA.  She was changed to warfarin 2018--INR/coumadin mgmt per HD/nephrol.  . Presence of permanent cardiac pacemaker   . Pulmonary edema 11/2016  . Right rib fracture 12/2016   s/p fall  . Sick sinus syndrome ([place])    Dual chamber pacer insertion 2018 (Dr. Lovena Le)  . Stroke [facility])    cardioembolic (had CVA while on no anticoag)--"scattered subacute punctate infarcts: 1 in R parietal lobe and 2 in occipital cortex" on MRI br.  CT angio head/neck: aortic arch athero.   . Thoracic back pain 01/2017   Facet?  Dr. Nelva Bush to do steroid injection as of 02/2017  . Thoracic compression fracture ([place]) 07/2016   T7 and T8-- T8 kyphoplasty during hosp admission 07/2016.  Neuro referred pt to pain mgmt for consideration of injection 09/2016.  I referred her to endo 08/2016 for consideration of calcitonin treatment.    Family History  Problem Relation Age of Onset  . Cancer Mother   . Heart disease Father   . Jimy Gates death Father   . Sudden Cardiac Death  Neg Hx     SOCIAL HISTORY: Social History   Tobacco Use  . Smoking status: Former Smoker    Years: 22.00    Last attempt to quit: 03/17/1972    Years since quitting: 45.6  . Smokeless tobacco: Never Used  Substance Use Topics  . Alcohol use: Yes    Alcohol/week: 0.6 oz    Types: 1 Glasses of wine per week    Comment: wine    Allergies  Allergen Reactions  . Clonidine Derivatives Palpitations and Other (See Comments)    VERY SEDATED  . Codeine Nausea Only  . Nickel Rash  . Sulfa Antibiotics Other (See Comments)    Reaction unknown    Current Outpatient Medications  Medication Sig Dispense Refill  .  acetaminophen (TYLENOL) 500 MG tablet Take 500-1,000 mg 3 (three) times daily as needed by mouth for moderate pain.     [name] amiodarone (PACERONE) 200 MG tablet Take 1 tablet (200 mg total) by mouth daily. KEEP OV. 90 tablet 0  . carvedilol (COREG) 3.125 MG tablet Take 1 tablet (3.125 mg total) by mouth 2 (two) times daily. 30 tablet 0  . HYDROcodone-acetaminophen (NORCO) 5-325 MG tablet Take 1 tablet by mouth every 6 (six) hours as needed for moderate pain or severe pain. 6 tablet 0  . isosorbide mononitrate (IMDUR) 60 MG 24 hr tablet TAKE 1 TABLET DAILY BY  MOUTH. 90 tablet 1  . Multiple Vitamin (MULTIVITAMIN) tablet Take 1 tablet by mouth 3 (three) times a week.     . ondansetron (ZOFRAN) 4 MG tablet Take 0.5-1 tablets (2-4 mg total) by mouth every 8 (eight) hours as needed for nausea or vomiting. 90 tablet 3  . polyethylene glycol (MIRALAX / GLYCOLAX) packet Take 17 g by mouth daily as needed. (Patient taking differently: Take 17 g daily as needed by mouth for mild constipation. ) 10 each 0  . pravastatin (PRAVACHOL) 40 MG tablet Take 1 tablet (40 mg total) every evening by mouth. 90 tablet 1  . sevelamer carbonate (RENVELA) 800 MG tablet Take 800 mg by mouth 2 (two) times daily with a meal.    . vitamin C (ASCORBIC ACID) 500 MG tablet Take 500 mg by mouth daily.    . Vitamin D, Ergocalciferol, (DRISDOL) 50000 units CAPS capsule Take 1 capsule (50,000 Units total) by mouth every 7 (seven) days. 12 capsule 3  . warfarin (COUMADIN) 3 MG tablet Take 1/2 to 1 tablets daily as directed by coumadin clinic 90 tablet 1  . oxyCODONE-acetaminophen (PERCOCET/ROXICET) 5-325 MG tablet Take 1-2 tablets every 6 (six) hours as needed by mouth for severe pain. (Patient not taking: Reported on 11/09/2017) 8 tablet 0   No current facility-administered medications for this visit.     REVIEW OF SYSTEMS:  [X]  denotes positive finding, [ ]  denotes negative finding Cardiac  Comments:  Chest pain or chest pressure:      Shortness of breath upon exertion:    Short of breath when lying flat:    Irregular heart rhythm:        Vascular    Pain in calf, thigh, or hip brought on by ambulation:    Pain in feet at night that wakes you up from your sleep:     Blood clot in your veins:    Leg swelling:           PHYSICAL EXAM: Vitals:   11/09/17 1516  BP: (!) 176/70  Pulse: 76  Temp: (!) 97 F (36.1  C)  TempSrc: Oral  SpO2: 96%  Weight: 117 lb (53.1 kg)  Height: 4\' 11"  (1.499 m)    GENERAL: The patient is a well-nourished female, in no acute distress. The vital signs are documented above. CARDIOVASCULAR: She does have 1+ radial pulse on the right.  She has good size maturation and excellent thrill through her basilic vein transposition fistula. PULMONARY: There is good air exchange  MUSCULOSKELETAL: There are no major deformities or cyanosis. NEUROLOGIC: No focal weakness or paresthesias are detected. SKIN: There are no ulcers or rashes noted. PSYCHIATRIC: The patient has a normal affect.  DATA:  None  MEDICAL ISSUES: I discussed access with the patient and her daughter present.  She does have good size of her vein with a superficial course.  Despite this she has had repeated failure of attempting to use this.  I explained that the fistula is essentially worthless if she cannot have reliable access which does not seem to be possible.  I have recommended right upper arm AV graft placement.  She has a left sided subclavian pacemaker.  We will schedule this at her convenience as an outpatient at [facility]    [name], MD [facility] Tel [phone] Pager [phone]

## 2017-11-09 NOTE — Progress Notes (Signed)
[facility] of [place]  Patient name: Valerie Terrell MRN: [id number] DOB: 1932-06-13 Sex: female  REASON FOR VISIT: Gus access for hemodialysis  HPI: Valerie Terrell is a 84 y.o. female here today for discussion of access for hemodialysis.  She currently is being dialyzed via a right IJ tunnel catheter which was placed in September 2018.  She had undergone a basilic vein transposition is a single stage by Dr. Bridgett Larsson on 03/31/2017.  She has had unsuccessful attempts at using this with infiltration and also has had angioplasty at [facility].  Past Medical History:  Diagnosis Date  . Anemia of chronic disease 2018   + anemia of CRI?  [name] Atrophy of left kidney    with absent blood flow by renal artery dopplers (Dr. Gwenlyn Found)  . 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 Chronic combined systolic and diastolic CHF (congestive heart failure) ([place])    a. 11/2016: echo showing EF of 35-40%, RV strain noted, mild MR and mild TR.   [name] Chronic hypoxemic respiratory failure ([place])   . Debilitated patient    [facility] dependent as of 2018  . Dysrhythmia    afib  . ESRD on hemodialysis ([place]) 01/2017   T/Th/Sat schedule- [place].  Right basilic AV fistula [id number]/6384.  . Family history of adverse reaction to anesthesia    Daughter- nausea  . History of adenomatous polyp of colon   . History of kidney stones    passed  . History of subarachnoid hemorrhage 10/2014   after syncope and while on xarelto  . HOH (hard of hearing)   . Hyperlipidemia   . Hypertension    Difficult to control, in the setting of one functioning kidney: pt was referred to nephrology by Dr. Gwenlyn Found 06/2015.  . Lumbar radiculopathy 2012  . Lumbar spinal stenosis 2019   facet injections only very short term relief.  As of 09/2017 ortho f/u, selective nerve root block planned.  . Lumbar spondylosis    MR 07/2016---no sign of spinal nerve compression or cord compression.  Pt  set up with outpt ortho while admitted to hosp 07/2016.  . Malnourished ([place])   . Metatarsal fracture 06/10/2016   Nondisplaced, left 5th metatarsal--pt was referred to ortho  . Nonischemic cardiomyopathy ([place])   . Osteopenia 2014   T-score -2.1  . PAF (paroxysmal atrial fibrillation) (HCC)    Eliquis started after BRAO and CVA.  She was changed to warfarin 2018--INR/coumadin mgmt per HD/nephrol.  . Presence of permanent cardiac pacemaker   . Pulmonary edema 11/2016  . Right rib fracture 12/2016   s/p fall  . Sick sinus syndrome ([place])    Dual chamber pacer insertion 2018 (Dr. Lovena Le)  . Stroke [facility])    cardioembolic (had CVA while on no anticoag)--"scattered subacute punctate infarcts: 1 in R parietal lobe and 2 in occipital cortex" on MRI br.  CT angio head/neck: aortic arch athero.   . Thoracic back pain 01/2017   Facet?  Dr. Nelva Bush to do steroid injection as of 02/2017  . Thoracic compression fracture ([place]) 07/2016   T7 and T8-- T8 kyphoplasty during hosp admission 07/2016.  Neuro referred pt to pain mgmt for consideration of injection 09/2016.  I referred her to endo 08/2016 for consideration of calcitonin treatment.    Family History  Problem Relation Age of Onset  . Cancer Mother   . Heart disease Father   . Kimberle Stanfill death Father   . Sudden Cardiac Death  Neg Hx     SOCIAL HISTORY: Social History   Tobacco Use  . Smoking status: Former Smoker    Years: 22.00    Last attempt to quit: 03/17/1972    Years since quitting: 45.6  . Smokeless tobacco: Never Used  Substance Use Topics  . Alcohol use: Yes    Alcohol/week: 0.6 oz    Types: 1 Glasses of wine per week    Comment: wine    Allergies  Allergen Reactions  . Clonidine Derivatives Palpitations and Other (See Comments)    VERY SEDATED  . Codeine Nausea Only  . Nickel Rash  . Sulfa Antibiotics Other (See Comments)    Reaction unknown    Current Outpatient Medications  Medication Sig Dispense Refill  .  acetaminophen (TYLENOL) 500 MG tablet Take 500-1,000 mg 3 (three) times daily as needed by mouth for moderate pain.     [name] amiodarone (PACERONE) 200 MG tablet Take 1 tablet (200 mg total) by mouth daily. KEEP OV. 90 tablet 0  . carvedilol (COREG) 3.125 MG tablet Take 1 tablet (3.125 mg total) by mouth 2 (two) times daily. 30 tablet 0  . HYDROcodone-acetaminophen (NORCO) 5-325 MG tablet Take 1 tablet by mouth every 6 (six) hours as needed for moderate pain or severe pain. 6 tablet 0  . isosorbide mononitrate (IMDUR) 60 MG 24 hr tablet TAKE 1 TABLET DAILY BY  MOUTH. 90 tablet 1  . Multiple Vitamin (MULTIVITAMIN) tablet Take 1 tablet by mouth 3 (three) times a week.     . ondansetron (ZOFRAN) 4 MG tablet Take 0.5-1 tablets (2-4 mg total) by mouth every 8 (eight) hours as needed for nausea or vomiting. 90 tablet 3  . polyethylene glycol (MIRALAX / GLYCOLAX) packet Take 17 g by mouth daily as needed. (Patient taking differently: Take 17 g daily as needed by mouth for mild constipation. ) 10 each 0  . pravastatin (PRAVACHOL) 40 MG tablet Take 1 tablet (40 mg total) every evening by mouth. 90 tablet 1  . sevelamer carbonate (RENVELA) 800 MG tablet Take 800 mg by mouth 2 (two) times daily with a meal.    . vitamin C (ASCORBIC ACID) 500 MG tablet Take 500 mg by mouth daily.    . Vitamin D, Ergocalciferol, (DRISDOL) 50000 units CAPS capsule Take 1 capsule (50,000 Units total) by mouth every 7 (seven) days. 12 capsule 3  . warfarin (COUMADIN) 3 MG tablet Take 1/2 to 1 tablets daily as directed by coumadin clinic 90 tablet 1  . oxyCODONE-acetaminophen (PERCOCET/ROXICET) 5-325 MG tablet Take 1-2 tablets every 6 (six) hours as needed by mouth for severe pain. (Patient not taking: Reported on 11/09/2017) 8 tablet 0   No current facility-administered medications for this visit.     REVIEW OF SYSTEMS:  [X]  denotes positive finding, [ ]  denotes negative finding Cardiac  Comments:  Chest pain or chest pressure:      Shortness of breath upon exertion:    Short of breath when lying flat:    Irregular heart rhythm:        Vascular    Pain in calf, thigh, or hip brought on by ambulation:    Pain in feet at night that wakes you up from your sleep:     Blood clot in your veins:    Leg swelling:           PHYSICAL EXAM: Vitals:   11/09/17 1516  BP: (!) 176/70  Pulse: 76  Temp: (!) 97 F (36.1  C)  TempSrc: Oral  SpO2: 96%  Weight: 117 lb (53.1 kg)  Height: 4\' 11"  (1.499 m)    GENERAL: The patient is a well-nourished female, in no acute distress. The vital signs are documented above. CARDIOVASCULAR: She does have 1+ radial pulse on the right.  She has good size maturation and excellent thrill through her basilic vein transposition fistula. PULMONARY: There is good air exchange  MUSCULOSKELETAL: There are no major deformities or cyanosis. NEUROLOGIC: No focal weakness or paresthesias are detected. SKIN: There are no ulcers or rashes noted. PSYCHIATRIC: The patient has a normal affect.  DATA:  None  MEDICAL ISSUES: I discussed access with the patient and her daughter present.  She does have good size of her vein with a superficial course.  Despite this she has had repeated failure of attempting to use this.  I explained that the fistula is essentially worthless if she cannot have reliable access which does not seem to be possible.  I have recommended right upper arm AV graft placement.  She has a left sided subclavian pacemaker.  We will schedule this at her convenience as an outpatient at [facility]    [name], MD [facility] Tel [phone] Pager [phone]

## 2017-11-10 ENCOUNTER — Telehealth: Payer: Self-pay

## 2017-11-10 NOTE — Telephone Encounter (Signed)
   Primary Cardiologist: Quay Burow, MD  Chart reviewed as part of pre-operative protocol coverage. Pharm question - will route to pharm pool. Of note, patient is also overdue for her follow-up (was advised 6 month follow-up in 03/2017) but has appt 11/16/17, will keep this one.   Charlie Pitter, PA-C 11/10/2017, 5:01 PM

## 2017-11-10 NOTE — Telephone Encounter (Signed)
   [facility] Pre-operative Risk Assessment    Request for surgical clearance:  1. What type of surgery is being performed? Right Upper Arm A/V Graft   2. When is this surgery scheduled? TBD   3. What type of clearance is required (medical clearance vs. Pharmacy clearance to hold med vs. Both)? Anticoagulation Clearance only  4. Are there any medications that need to be held prior to surgery and how long? Coumadin 5 days prior    5. Practice name and name of physician performing surgery? [facility]- Dr. Sherren Mocha Early   6. What is your office phone number? [phone]   7.   What is your office fax number? [phone] Attn: Becky  8.   Anesthesia type (None, local, MAC, general) ? MAC

## 2017-11-11 ENCOUNTER — Other Ambulatory Visit: Payer: Self-pay | Admitting: *Deleted

## 2017-11-11 ENCOUNTER — Encounter: Payer: Self-pay | Admitting: Family Medicine

## 2017-11-11 DIAGNOSIS — D631 Anemia in chronic kidney disease: Secondary | ICD-10-CM | POA: Diagnosis not present

## 2017-11-11 DIAGNOSIS — N186 End stage renal disease: Secondary | ICD-10-CM | POA: Diagnosis not present

## 2017-11-11 DIAGNOSIS — Z4901 Encounter for fitting and adjustment of extracorporeal dialysis catheter: Secondary | ICD-10-CM | POA: Diagnosis not present

## 2017-11-11 DIAGNOSIS — D689 Coagulation defect, unspecified: Secondary | ICD-10-CM | POA: Diagnosis not present

## 2017-11-11 DIAGNOSIS — E876 Hypokalemia: Secondary | ICD-10-CM | POA: Diagnosis not present

## 2017-11-11 DIAGNOSIS — N2581 Secondary hyperparathyroidism of renal origin: Secondary | ICD-10-CM | POA: Diagnosis not present

## 2017-11-11 NOTE — Progress Notes (Signed)
Phone call to patient instructed to be at [facility] admitting department at 5:30 am on July 22 for surgery. All other instruction same as in letter. Verbalized understanding.

## 2017-11-12 ENCOUNTER — Other Ambulatory Visit: Payer: Self-pay | Admitting: *Deleted

## 2017-11-12 NOTE — Telephone Encounter (Signed)
Pt takes warfarin for afib with CHADS2VASc score of 7 (age x2, sex, HTN, CHF, CVA). Pt will require Lovenox bridging in order to hold Coumadin for 5 days due to hx of afib and stroke. Will route to [facility] office who manages patient's Coumadin to help coordinate.

## 2017-11-12 NOTE — Telephone Encounter (Signed)
Patient notified about Levenox bridge. Advised that this will be discussed during her visit next week. Patient voiced understanding.

## 2017-11-13 DIAGNOSIS — Z4901 Encounter for fitting and adjustment of extracorporeal dialysis catheter: Secondary | ICD-10-CM | POA: Diagnosis not present

## 2017-11-13 DIAGNOSIS — E876 Hypokalemia: Secondary | ICD-10-CM | POA: Diagnosis not present

## 2017-11-13 DIAGNOSIS — N186 End stage renal disease: Secondary | ICD-10-CM | POA: Diagnosis not present

## 2017-11-13 DIAGNOSIS — D689 Coagulation defect, unspecified: Secondary | ICD-10-CM | POA: Diagnosis not present

## 2017-11-13 DIAGNOSIS — D631 Anemia in chronic kidney disease: Secondary | ICD-10-CM | POA: Diagnosis not present

## 2017-11-13 DIAGNOSIS — N2581 Secondary hyperparathyroidism of renal origin: Secondary | ICD-10-CM | POA: Diagnosis not present

## 2017-11-14 DIAGNOSIS — N186 End stage renal disease: Secondary | ICD-10-CM | POA: Diagnosis not present

## 2017-11-14 DIAGNOSIS — I120 Hypertensive chronic kidney disease with stage 5 chronic kidney disease or end stage renal disease: Secondary | ICD-10-CM | POA: Diagnosis not present

## 2017-11-14 DIAGNOSIS — Z992 Dependence on renal dialysis: Secondary | ICD-10-CM | POA: Diagnosis not present

## 2017-11-15 ENCOUNTER — Other Ambulatory Visit: Payer: Self-pay | Admitting: Family Medicine

## 2017-11-16 ENCOUNTER — Ambulatory Visit: Payer: Self-pay | Admitting: Vascular Surgery

## 2017-11-16 ENCOUNTER — Encounter: Payer: Self-pay | Admitting: Cardiovascular Disease

## 2017-11-16 ENCOUNTER — Ambulatory Visit (INDEPENDENT_AMBULATORY_CARE_PROVIDER_SITE_OTHER): Payer: Medicare Other | Admitting: Cardiovascular Disease

## 2017-11-16 ENCOUNTER — Ambulatory Visit (INDEPENDENT_AMBULATORY_CARE_PROVIDER_SITE_OTHER): Payer: Medicare Other | Admitting: Pharmacist Clinician (PhC)/ Clinical Pharmacy Specialist

## 2017-11-16 VITALS — BP 144/72 | HR 69 | Ht 59.0 in | Wt 122.0 lb

## 2017-11-16 DIAGNOSIS — E785 Hyperlipidemia, unspecified: Secondary | ICD-10-CM

## 2017-11-16 DIAGNOSIS — Z4901 Encounter for fitting and adjustment of extracorporeal dialysis catheter: Secondary | ICD-10-CM | POA: Diagnosis not present

## 2017-11-16 DIAGNOSIS — N2581 Secondary hyperparathyroidism of renal origin: Secondary | ICD-10-CM | POA: Diagnosis not present

## 2017-11-16 DIAGNOSIS — I428 Other cardiomyopathies: Secondary | ICD-10-CM | POA: Diagnosis not present

## 2017-11-16 DIAGNOSIS — I480 Paroxysmal atrial fibrillation: Secondary | ICD-10-CM

## 2017-11-16 DIAGNOSIS — I10 Essential (primary) hypertension: Secondary | ICD-10-CM | POA: Diagnosis not present

## 2017-11-16 DIAGNOSIS — I4891 Unspecified atrial fibrillation: Secondary | ICD-10-CM | POA: Diagnosis not present

## 2017-11-16 DIAGNOSIS — I5041 Acute combined systolic (congestive) and diastolic (congestive) heart failure: Secondary | ICD-10-CM

## 2017-11-16 DIAGNOSIS — D631 Anemia in chronic kidney disease: Secondary | ICD-10-CM | POA: Diagnosis not present

## 2017-11-16 DIAGNOSIS — D689 Coagulation defect, unspecified: Secondary | ICD-10-CM | POA: Diagnosis not present

## 2017-11-16 DIAGNOSIS — E876 Hypokalemia: Secondary | ICD-10-CM | POA: Diagnosis not present

## 2017-11-16 DIAGNOSIS — N186 End stage renal disease: Secondary | ICD-10-CM | POA: Diagnosis not present

## 2017-11-16 DIAGNOSIS — Z7901 Long term (current) use of anticoagulants: Secondary | ICD-10-CM | POA: Diagnosis not present

## 2017-11-16 DIAGNOSIS — Z950 Presence of cardiac pacemaker: Secondary | ICD-10-CM

## 2017-11-16 LAB — POCT INR: INR: 2.1 (ref 2.0–3.0)

## 2017-11-16 MED ORDER — ENOXAPARIN SODIUM 60 MG/0.6ML ~~LOC~~ SOLN: 60.0000 mg | SUBCUTANEOUS | 0 refills | Status: DC

## 2017-11-16 NOTE — Assessment & Plan Note (Signed)
History of essential hypertension with blood pressure measured at 144/72.  He is on carvedilol.  Continue current meds at current dosing.

## 2017-11-16 NOTE — Assessment & Plan Note (Signed)
History of cardiac pacemaker implanted 12/03/2016 for tachycardia/bradycardia syndrome followed by Dr. Lovena Le.

## 2017-11-16 NOTE — Assessment & Plan Note (Signed)
History of nonischemic cardia myopathy with an EF in the 35 to 40% range.  Her volume status is currently controlled with dialysis.

## 2017-11-16 NOTE — Progress Notes (Signed)
11/16/2017 Valerie Terrell   24-Dec-1932  [id number]  Primary Physician McGowen, Adrian Blackwater, MD Primary Cardiologist: Lorretta Harp MD [name], [place]  HPI:  Valerie Terrell is a 84 y.o.  widowed Caucasian female mother of one daughter Valerie Terrell who accompanies her today andwho I last saw in the office  04/06/2017. 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 She smoked remotely and drinks socially. She does have a history of difficult to control hypertension. Hyperlipidemia. She had a 2-D echo which was normal, a Myoview stress test which was normal and a CT angiogram performed 01/29/16 that showed no evidence of osseous stenosis. She does have difficult to control hypertension with a recent renal Doppler study performed 02/26/16 that showed an atrophic nonfunctioning left kidney and a right kidney of measuring 11 cm with at least a moderate right renal artery stenosis. Her serum creatinine in September was 1.15 and currently isn't elicited is in the 1.6 range potentially related to hay her ARB. Her ARB was stopped her serum creatinine fell to 1.4. She saw Dr. Erling Cruz, nephrology, who increased her hydralazine and clonidine. She was intolerant to her higher hydralazine dose and seems to be intolerant to the clonidine as well. She had difficult to control hypertension, progressive renal insufficiency and bilateral lower extremity edema on high-dose diuretics. She was hospitalized in July for combined systolic and diastolic heart failure and had a permanent transvenous pacemaker placed by Dr. Lovena Le for tachybradycardia syndrome. She was admitted again in September and began dialysis. Her EF in July was 35-40% which is reduced compared to the EF in September 60 to 65%. Her Eliquis  was changed to Coumadin. She is currently being dialyzed 3 times a week.  Her edema has improved as has her blood pressure control and her breathing. Since I saw her in the office November 2018 she is continued to do well.  She did have a mechanical fall earlier this year with laceration of her scalp and bleeding issues related to Coumadin.  This was not syncope.  She feels occasional palpitations in her chest and is scheduled to see Dr. Lovena Le later this month for pacemaker follow-up.  She is also scheduled to have a fistula placed by Dr. Donnetta Hutching.     Current Meds  Medication Sig  . acetaminophen (TYLENOL) 500 MG tablet Take 500-1,000 mg 3 (three) times daily as needed by mouth for moderate pain.   [name] amiodarone (PACERONE) 200 MG tablet Take 1 tablet (200 mg total) by mouth daily. KEEP OV.  . carvedilol (COREG) 3.125 MG tablet Take 1 tablet (3.125 mg total) by mouth 2 (two) times daily.  [name] HYDROcodone-acetaminophen (NORCO) 5-325 MG tablet Take 1 tablet by mouth every 6 (six) hours as needed for moderate pain or severe pain.  . isosorbide mononitrate (IMDUR) 60 MG 24 hr tablet TAKE 1 TABLET DAILY BY  MOUTH.  . Multiple Vitamin (MULTIVITAMIN) tablet Take 1 tablet by mouth 3 (three) times a week.   . ondansetron (ZOFRAN) 4 MG tablet Take 0.5-1 tablets (2-4 mg total) by mouth every 8 (eight) hours as needed for nausea or vomiting.  [name] oxyCODONE-acetaminophen (PERCOCET/ROXICET) 5-325 MG tablet Take 1-2 tablets every 6 (six) hours as needed by mouth for severe pain.  . polyethylene glycol (MIRALAX / GLYCOLAX) packet Take 17 g by mouth daily as needed. (  Patient taking differently: Take 17 g daily as needed by mouth for mild constipation. )  . pravastatin (PRAVACHOL) 40 MG tablet TAKE 1 TABLET EVERY EVENING BY MOUTH.  . sevelamer carbonate (RENVELA) 800 MG tablet Take 800 mg by mouth 2 (two) times daily with a meal.  . vitamin C (ASCORBIC ACID) 500 MG tablet Take 500 mg by mouth daily.  . Vitamin D, Ergocalciferol, (DRISDOL) 50000 units CAPS capsule Take 1 capsule (50,000 Units  total) by mouth every 7 (seven) days.  [name] warfarin (COUMADIN) 3 MG tablet Take 1/2 to 1 tablets daily as directed by coumadin clinic     Allergies  Allergen Reactions  . Clonidine Derivatives Palpitations and Other (See Comments)    VERY SEDATED  . Codeine Nausea Only  . Nickel Rash  . Sulfa Antibiotics Other (See Comments)    Reaction unknown    Social History   Socioeconomic History  . Marital status: Widowed    Spouse name: Not on file  . Number of children: Not on file  . Years of education: Not on file  . Highest education level: Not on file  Occupational History  . Not on file  Social Needs  . Financial resource strain: Not on file  . Food insecurity:    Worry: Not on file    Inability: Not on file  . Transportation needs:    Medical: Not on file    Non-medical: Not on file  Tobacco Use  . Smoking status: Former Smoker    Years: 22.00    Last attempt to quit: 03/17/1972    Years since quitting: 45.6  . Smokeless tobacco: Never Used  Substance and Sexual Activity  . Alcohol use: Yes    Alcohol/week: 0.6 oz    Types: 1 Glasses of wine per week    Comment: wine  . Drug use: No  . Sexual activity: Never  Lifestyle  . Physical activity:    Days per week: Not on file    Minutes per session: Not on file  . Stress: Not on file  Relationships  . Social connections:    Talks on phone: Not on file    Gets together: Not on file    Attends religious service: Not on file    Active member of club or organization: Not on file    Attends meetings of clubs or organizations: Not on file    Relationship status: Not on file  . Intimate partner violence:    Fear of current or ex partner: Not on file    Emotionally abused: Not on file    Physically abused: Not on file    Forced sexual activity: Not on file  Other Topics Concern  . Not on file  Social History Narrative   Widow.  One daughter, lives with her.   Educ: college   Occup: retired Marine scientist.   No T/A/Ds.   She  is almost a vegetarian.     Review of Systems: General: negative for chills, fever, night sweats or weight changes.  Cardiovascular: negative for chest pain, dyspnea on exertion, edema, orthopnea, palpitations, paroxysmal nocturnal dyspnea or shortness of breath Dermatological: negative for rash Respiratory: negative for cough or wheezing Urologic: negative for hematuria Abdominal: negative for nausea, vomiting, diarrhea, bright red blood per rectum, melena, or hematemesis Neurologic: negative for visual changes, syncope, or dizziness All other systems reviewed and are otherwise negative except as noted above.    Blood pressure (!) 144/72, pulse 69, height 4\' 11"  (  1.499 m), weight 122 lb (55.3 kg).  General appearance: alert and no distress Neck: no adenopathy, no JVD, supple, symmetrical, trachea midline, thyroid not enlarged, symmetric, no tenderness/mass/nodules and Bilateral carotid bruits Lungs: clear to auscultation bilaterally Heart: Soft outflow tract murmur Extremities: extremities normal, atraumatic, no cyanosis or edema Pulses: 2+ and symmetric Skin: Skin color, texture, turgor normal. No rashes or lesions Neurologic: Alert and oriented X 3, normal strength and tone. Normal symmetric reflexes. Normal coordination and gait  EKG sinus rhythm at 69 with nonspecific ST and T wave changes.  I personally reviewed this EKG.  ASSESSMENT AND PLAN:   PAF (paroxysmal atrial fibrillation) (HCC) History of PAF maintaining sinus rhythm on Coumadin anticoagulation.  Essential hypertension History of essential hypertension with blood pressure measured at 144/72.  He is on carvedilol.  Continue current meds at current dosing.  Dyslipidemia History of dyslipidemia on statin therapy  Cardiac pacemaker History of cardiac pacemaker implanted 12/03/2016 for tachycardia/bradycardia syndrome followed by Dr. Lovena Le.  Non-ischemic cardiomyopathy ([place]) History of nonischemic cardia myopathy  with an EF in the 35 to 40% range.  Her volume status is currently controlled with dialysis.      Lorretta Harp MD FACP,FACC,FAHA, [facility] 11/16/2017 10:05 AM

## 2017-11-16 NOTE — Patient Instructions (Signed)

## 2017-11-16 NOTE — Assessment & Plan Note (Signed)
History of dyslipidemia on statin therapy

## 2017-11-16 NOTE — Patient Instructions (Addendum)
July 16: Last dose of Coumadin.  July 17: No Coumadin or Lovenox.  July 18: Inject Lovenox 60 mg in the fatty abdominal tissue at least 2 inches from the belly button once daily, 8am. No Coumadin.  July 19: Inject Lovenox in the fatty tissue every 24 hours, 8am. No Coumadin.  July 20: Inject Lovenox in the fatty tissue every 24 hours, 8am and 8pm. No Coumadin.  July 21: Inject Lovenox in the fatty tissue every 24 hours, at 8 am. No Coumadin.  July 22 - No Lovenox - Take Coumadin 3 mg  in the evening or as directed by doctor  July 23: Resume Lovenox inject in the fatty tissue every 24 hours (8am) and take Coumadin 3 mg.  July 24: Inject Lovenox in the fatty tissue every 24 hours (8am) and take Coumadin 3 mg.  July 25: Inject Lovenox in the fatty tissue every 24 hours (8am) and take Coumadin 1.5 mg.  July 26: Inject Lovenox in the fatty tissue every 24 hours (8am) and take Coumadin 3 mg.  July 27: Inject Lovenox in the fatty tissue every 24 hours (8am) and take Coumadin 1.5 mg.  Resume previous Coumadin schedule, repeat INR Monday or Tuesday

## 2017-11-16 NOTE — Assessment & Plan Note (Signed)
History of PAF maintaining sinus rhythm on Coumadin anticoagulation 

## 2017-11-18 DIAGNOSIS — E876 Hypokalemia: Secondary | ICD-10-CM | POA: Diagnosis not present

## 2017-11-18 DIAGNOSIS — D631 Anemia in chronic kidney disease: Secondary | ICD-10-CM | POA: Diagnosis not present

## 2017-11-18 DIAGNOSIS — N2581 Secondary hyperparathyroidism of renal origin: Secondary | ICD-10-CM | POA: Diagnosis not present

## 2017-11-18 DIAGNOSIS — D689 Coagulation defect, unspecified: Secondary | ICD-10-CM | POA: Diagnosis not present

## 2017-11-18 DIAGNOSIS — Z4901 Encounter for fitting and adjustment of extracorporeal dialysis catheter: Secondary | ICD-10-CM | POA: Diagnosis not present

## 2017-11-18 DIAGNOSIS — N186 End stage renal disease: Secondary | ICD-10-CM | POA: Diagnosis not present

## 2017-11-18 LAB — PROTIME-INR

## 2017-11-20 DIAGNOSIS — D631 Anemia in chronic kidney disease: Secondary | ICD-10-CM | POA: Diagnosis not present

## 2017-11-20 DIAGNOSIS — N186 End stage renal disease: Secondary | ICD-10-CM | POA: Diagnosis not present

## 2017-11-20 DIAGNOSIS — N2581 Secondary hyperparathyroidism of renal origin: Secondary | ICD-10-CM | POA: Diagnosis not present

## 2017-11-20 DIAGNOSIS — Z4901 Encounter for fitting and adjustment of extracorporeal dialysis catheter: Secondary | ICD-10-CM | POA: Diagnosis not present

## 2017-11-20 DIAGNOSIS — D689 Coagulation defect, unspecified: Secondary | ICD-10-CM | POA: Diagnosis not present

## 2017-11-20 DIAGNOSIS — E876 Hypokalemia: Secondary | ICD-10-CM | POA: Diagnosis not present

## 2017-11-23 DIAGNOSIS — E876 Hypokalemia: Secondary | ICD-10-CM | POA: Diagnosis not present

## 2017-11-23 DIAGNOSIS — D689 Coagulation defect, unspecified: Secondary | ICD-10-CM | POA: Diagnosis not present

## 2017-11-23 DIAGNOSIS — N2581 Secondary hyperparathyroidism of renal origin: Secondary | ICD-10-CM | POA: Diagnosis not present

## 2017-11-23 DIAGNOSIS — D631 Anemia in chronic kidney disease: Secondary | ICD-10-CM | POA: Diagnosis not present

## 2017-11-23 DIAGNOSIS — Z4901 Encounter for fitting and adjustment of extracorporeal dialysis catheter: Secondary | ICD-10-CM | POA: Diagnosis not present

## 2017-11-23 DIAGNOSIS — N186 End stage renal disease: Secondary | ICD-10-CM | POA: Diagnosis not present

## 2017-11-24 ENCOUNTER — Other Ambulatory Visit: Payer: Self-pay | Admitting: Cardiovascular Disease

## 2017-11-25 DIAGNOSIS — D631 Anemia in chronic kidney disease: Secondary | ICD-10-CM | POA: Diagnosis not present

## 2017-11-25 DIAGNOSIS — N2581 Secondary hyperparathyroidism of renal origin: Secondary | ICD-10-CM | POA: Diagnosis not present

## 2017-11-25 DIAGNOSIS — D689 Coagulation defect, unspecified: Secondary | ICD-10-CM | POA: Diagnosis not present

## 2017-11-25 DIAGNOSIS — Z4901 Encounter for fitting and adjustment of extracorporeal dialysis catheter: Secondary | ICD-10-CM | POA: Diagnosis not present

## 2017-11-25 DIAGNOSIS — N186 End stage renal disease: Secondary | ICD-10-CM | POA: Diagnosis not present

## 2017-11-25 DIAGNOSIS — E876 Hypokalemia: Secondary | ICD-10-CM | POA: Diagnosis not present

## 2017-11-27 DIAGNOSIS — D631 Anemia in chronic kidney disease: Secondary | ICD-10-CM | POA: Diagnosis not present

## 2017-11-27 DIAGNOSIS — N2581 Secondary hyperparathyroidism of renal origin: Secondary | ICD-10-CM | POA: Diagnosis not present

## 2017-11-27 DIAGNOSIS — D689 Coagulation defect, unspecified: Secondary | ICD-10-CM | POA: Diagnosis not present

## 2017-11-27 DIAGNOSIS — N186 End stage renal disease: Secondary | ICD-10-CM | POA: Diagnosis not present

## 2017-11-27 DIAGNOSIS — E876 Hypokalemia: Secondary | ICD-10-CM | POA: Diagnosis not present

## 2017-11-27 DIAGNOSIS — Z4901 Encounter for fitting and adjustment of extracorporeal dialysis catheter: Secondary | ICD-10-CM | POA: Diagnosis not present

## 2017-11-29 ENCOUNTER — Encounter: Payer: Self-pay | Admitting: Family Medicine

## 2017-11-30 DIAGNOSIS — D631 Anemia in chronic kidney disease: Secondary | ICD-10-CM | POA: Diagnosis not present

## 2017-11-30 DIAGNOSIS — E876 Hypokalemia: Secondary | ICD-10-CM | POA: Diagnosis not present

## 2017-11-30 DIAGNOSIS — N2581 Secondary hyperparathyroidism of renal origin: Secondary | ICD-10-CM | POA: Diagnosis not present

## 2017-11-30 DIAGNOSIS — D689 Coagulation defect, unspecified: Secondary | ICD-10-CM | POA: Diagnosis not present

## 2017-11-30 DIAGNOSIS — Z4901 Encounter for fitting and adjustment of extracorporeal dialysis catheter: Secondary | ICD-10-CM | POA: Diagnosis not present

## 2017-11-30 DIAGNOSIS — N186 End stage renal disease: Secondary | ICD-10-CM | POA: Diagnosis not present

## 2017-12-01 ENCOUNTER — Telehealth: Payer: Self-pay | Admitting: Pharmacist

## 2017-12-01 NOTE — Telephone Encounter (Signed)
Pt called in about lovenox instructions. Instructions state to take once daily on 7/20 but then list in AM and PM. Advised that she should only take AM dose and NOT take in PM as dosing is once daily. She states understanding and updated instructions on sheet.

## 2017-12-02 DIAGNOSIS — D631 Anemia in chronic kidney disease: Secondary | ICD-10-CM | POA: Diagnosis not present

## 2017-12-02 DIAGNOSIS — N2581 Secondary hyperparathyroidism of renal origin: Secondary | ICD-10-CM | POA: Diagnosis not present

## 2017-12-02 DIAGNOSIS — Z4901 Encounter for fitting and adjustment of extracorporeal dialysis catheter: Secondary | ICD-10-CM | POA: Diagnosis not present

## 2017-12-02 DIAGNOSIS — E876 Hypokalemia: Secondary | ICD-10-CM | POA: Diagnosis not present

## 2017-12-02 DIAGNOSIS — D689 Coagulation defect, unspecified: Secondary | ICD-10-CM | POA: Diagnosis not present

## 2017-12-02 DIAGNOSIS — N186 End stage renal disease: Secondary | ICD-10-CM | POA: Diagnosis not present

## 2017-12-03 ENCOUNTER — Other Ambulatory Visit: Payer: Self-pay

## 2017-12-03 ENCOUNTER — Encounter (HOSPITAL_COMMUNITY): Payer: Self-pay | Admitting: *Deleted

## 2017-12-03 LAB — PROTIME-INR

## 2017-12-03 NOTE — Progress Notes (Signed)
Spoke with pt for pre-op call. Pt has hx of A-fib and has a pacemaker. Pt's cardiologist is Dr. Gwenlyn Found and is followed by Dr. Lovena Le for her pacemaker. Last office visit with Dr. Gwenlyn Found was 11/16/17. She denies any recent chest pain and states she is not diabetic. Pt states she has stopped her Coumadin on 12/01/17, started Lovenox injection once a day on 12/02/17 and last dose prior to surgery will be 12/05/17.

## 2017-12-04 DIAGNOSIS — D689 Coagulation defect, unspecified: Secondary | ICD-10-CM | POA: Diagnosis not present

## 2017-12-04 DIAGNOSIS — Z4901 Encounter for fitting and adjustment of extracorporeal dialysis catheter: Secondary | ICD-10-CM | POA: Diagnosis not present

## 2017-12-04 DIAGNOSIS — N2581 Secondary hyperparathyroidism of renal origin: Secondary | ICD-10-CM | POA: Diagnosis not present

## 2017-12-04 DIAGNOSIS — E876 Hypokalemia: Secondary | ICD-10-CM | POA: Diagnosis not present

## 2017-12-04 DIAGNOSIS — D631 Anemia in chronic kidney disease: Secondary | ICD-10-CM | POA: Diagnosis not present

## 2017-12-04 DIAGNOSIS — N186 End stage renal disease: Secondary | ICD-10-CM | POA: Diagnosis not present

## 2017-12-06 ENCOUNTER — Encounter (HOSPITAL_COMMUNITY)
Admission: RE | Disposition: A | Discharge status: Home / Self Care | Payer: Self-pay | Source: Ambulatory Visit | Attending: Vascular Surgery

## 2017-12-06 ENCOUNTER — Ambulatory Visit (HOSPITAL_COMMUNITY): Payer: Medicare Other | Admitting: Certified Registered"

## 2017-12-06 ENCOUNTER — Encounter (HOSPITAL_COMMUNITY): Payer: Self-pay | Admitting: Urology

## 2017-12-06 ENCOUNTER — Ambulatory Visit (HOSPITAL_COMMUNITY)
Admission: RE | Admit: 2017-12-06 | Discharge: 2017-12-06 | Disposition: A | Discharge status: Home / Self Care | Payer: Medicare Other | Source: Ambulatory Visit | Attending: Vascular Surgery | Admitting: Vascular Surgery

## 2017-12-06 DIAGNOSIS — Z7901 Long term (current) use of anticoagulants: Secondary | ICD-10-CM | POA: Diagnosis not present

## 2017-12-06 DIAGNOSIS — Z87891 Personal history of nicotine dependence: Secondary | ICD-10-CM | POA: Diagnosis not present

## 2017-12-06 DIAGNOSIS — Z950 Presence of cardiac pacemaker: Secondary | ICD-10-CM | POA: Insufficient documentation

## 2017-12-06 DIAGNOSIS — I5042 Chronic combined systolic (congestive) and diastolic (congestive) heart failure: Secondary | ICD-10-CM | POA: Diagnosis not present

## 2017-12-06 DIAGNOSIS — I428 Other cardiomyopathies: Secondary | ICD-10-CM | POA: Insufficient documentation

## 2017-12-06 DIAGNOSIS — Z87442 Personal history of urinary calculi: Secondary | ICD-10-CM | POA: Diagnosis not present

## 2017-12-06 DIAGNOSIS — T82898A Other specified complication of vascular prosthetic devices, implants and grafts, initial encounter: Secondary | ICD-10-CM | POA: Diagnosis not present

## 2017-12-06 DIAGNOSIS — Z6824 Body mass index (BMI) 24.0-24.9, adult: Secondary | ICD-10-CM | POA: Insufficient documentation

## 2017-12-06 DIAGNOSIS — M5416 Radiculopathy, lumbar region: Secondary | ICD-10-CM | POA: Diagnosis not present

## 2017-12-06 DIAGNOSIS — I480 Paroxysmal atrial fibrillation: Secondary | ICD-10-CM | POA: Diagnosis not present

## 2017-12-06 DIAGNOSIS — N186 End stage renal disease: Secondary | ICD-10-CM | POA: Diagnosis not present

## 2017-12-06 DIAGNOSIS — H34232 Retinal artery branch occlusion, left eye: Secondary | ICD-10-CM | POA: Diagnosis not present

## 2017-12-06 DIAGNOSIS — M48061 Spinal stenosis, lumbar region without neurogenic claudication: Secondary | ICD-10-CM | POA: Insufficient documentation

## 2017-12-06 DIAGNOSIS — E46 Unspecified protein-calorie malnutrition: Secondary | ICD-10-CM | POA: Diagnosis not present

## 2017-12-06 DIAGNOSIS — Z8673 Personal history of transient ischemic attack (TIA), and cerebral infarction without residual deficits: Secondary | ICD-10-CM | POA: Diagnosis not present

## 2017-12-06 DIAGNOSIS — I509 Heart failure, unspecified: Secondary | ICD-10-CM | POA: Diagnosis not present

## 2017-12-06 DIAGNOSIS — X58XXXA Exposure to other specified factors, initial encounter: Secondary | ICD-10-CM | POA: Diagnosis not present

## 2017-12-06 DIAGNOSIS — Z86010 Personal history of colonic polyps: Secondary | ICD-10-CM | POA: Diagnosis not present

## 2017-12-06 DIAGNOSIS — E785 Hyperlipidemia, unspecified: Secondary | ICD-10-CM | POA: Diagnosis not present

## 2017-12-06 DIAGNOSIS — H9190 Unspecified hearing loss, unspecified ear: Secondary | ICD-10-CM | POA: Insufficient documentation

## 2017-12-06 DIAGNOSIS — Z8249 Family history of ischemic heart disease and other diseases of the circulatory system: Secondary | ICD-10-CM | POA: Insufficient documentation

## 2017-12-06 DIAGNOSIS — Z888 Allergy status to other drugs, medicaments and biological substances status: Secondary | ICD-10-CM | POA: Insufficient documentation

## 2017-12-06 DIAGNOSIS — Z809 Family history of malignant neoplasm, unspecified: Secondary | ICD-10-CM | POA: Diagnosis not present

## 2017-12-06 DIAGNOSIS — Z79899 Other long term (current) drug therapy: Secondary | ICD-10-CM | POA: Insufficient documentation

## 2017-12-06 DIAGNOSIS — Z993 Dependence on wheelchair: Secondary | ICD-10-CM | POA: Diagnosis not present

## 2017-12-06 DIAGNOSIS — I132 Hypertensive heart and chronic kidney disease with heart failure and with stage 5 chronic kidney disease, or end stage renal disease: Secondary | ICD-10-CM | POA: Insufficient documentation

## 2017-12-06 DIAGNOSIS — D631 Anemia in chronic kidney disease: Secondary | ICD-10-CM | POA: Insufficient documentation

## 2017-12-06 DIAGNOSIS — Z882 Allergy status to sulfonamides status: Secondary | ICD-10-CM | POA: Insufficient documentation

## 2017-12-06 DIAGNOSIS — Z885 Allergy status to narcotic agent status: Secondary | ICD-10-CM | POA: Insufficient documentation

## 2017-12-06 DIAGNOSIS — Z992 Dependence on renal dialysis: Secondary | ICD-10-CM | POA: Insufficient documentation

## 2017-12-06 HISTORY — PX: OTHER SURGICAL HISTORY: SHX169

## 2017-12-06 HISTORY — PX: AV FISTULA PLACEMENT: SHX1204

## 2017-12-06 LAB — PROTIME-INR
INR: 1.05
Prothrombin Time: 13.6 seconds (ref 11.4–15.2)

## 2017-12-06 LAB — POCT I-STAT 4, (NA,K, GLUC, HGB,HCT)
GLUCOSE: 83 mg/dL (ref 70–99)
HEMATOCRIT: 33 % — AB (ref 36.0–46.0)
HEMOGLOBIN: 11.2 g/dL — AB (ref 12.0–15.0)
Potassium: 3.4 mmol/L — ABNORMAL LOW (ref 3.5–5.1)
SODIUM: 138 mmol/L (ref 135–145)

## 2017-12-06 SURGERY — INSERTION OF ARTERIOVENOUS (AV) GORE-TEX GRAFT ARM
Anesthesia: Monitor Anesthesia Care | Laterality: Right

## 2017-12-06 MED ORDER — LIDOCAINE 2% (20 MG/ML) 5 ML SYRINGE
INTRAMUSCULAR | Status: DC | PRN
  Administered 2017-12-06: 60 mg via INTRAVENOUS

## 2017-12-06 MED ORDER — LIDOCAINE-EPINEPHRINE 0.5 %-1:200000 IJ SOLN
INTRAMUSCULAR | Status: DC | PRN
  Administered 2017-12-06: 50 mL

## 2017-12-06 MED ORDER — ACETAMINOPHEN 500 MG PO TABS
500.0000 mg | ORAL_TABLET | Freq: Once | ORAL | Status: AC
  Administered 2017-12-06: 500 mg via ORAL

## 2017-12-06 MED ORDER — FENTANYL CITRATE (PF) 250 MCG/5ML IJ SOLN
INTRAMUSCULAR | Status: AC
  Filled 2017-12-06: qty 5

## 2017-12-06 MED ORDER — HYDROMORPHONE HCL 1 MG/ML IJ SOLN: 0.2500 mg | INTRAMUSCULAR | Status: DC | PRN

## 2017-12-06 MED ORDER — HYDROCODONE-ACETAMINOPHEN 5-325 MG PO TABS: 1.0000 | ORAL_TABLET | Freq: Four times a day (QID) | ORAL | 0 refills | Status: DC | PRN

## 2017-12-06 MED ORDER — ONDANSETRON HCL 4 MG/2ML IJ SOLN: 4.0000 mg | Freq: Once | INTRAMUSCULAR | Status: DC | PRN

## 2017-12-06 MED ORDER — HEPARIN SODIUM (PORCINE) 5000 UNIT/ML IJ SOLN
INTRAMUSCULAR | Status: AC
  Filled 2017-12-06: qty 1.2

## 2017-12-06 MED ORDER — PHENYLEPHRINE 40 MCG/ML (10ML) SYRINGE FOR IV PUSH (FOR BLOOD PRESSURE SUPPORT)
PREFILLED_SYRINGE | INTRAVENOUS | Status: AC
  Filled 2017-12-06: qty 10

## 2017-12-06 MED ORDER — MEPERIDINE HCL 50 MG/ML IJ SOLN: 6.2500 mg | INTRAMUSCULAR | Status: DC | PRN

## 2017-12-06 MED ORDER — SODIUM CHLORIDE 0.9 % IV SOLN
INTRAVENOUS | Status: DC | PRN
  Administered 2017-12-06: 500 mL

## 2017-12-06 MED ORDER — PROPOFOL 500 MG/50ML IV EMUL
INTRAVENOUS | Status: DC | PRN
  Administered 2017-12-06: 100 ug/kg/min via INTRAVENOUS

## 2017-12-06 MED ORDER — SODIUM CHLORIDE 0.9 % IJ SOLN
INTRAMUSCULAR | Status: AC
  Filled 2017-12-06: qty 10

## 2017-12-06 MED ORDER — EPHEDRINE SULFATE 50 MG/ML IJ SOLN
INTRAMUSCULAR | Status: AC
  Filled 2017-12-06: qty 1

## 2017-12-06 MED ORDER — ONDANSETRON HCL 4 MG/2ML IJ SOLN
INTRAMUSCULAR | Status: DC | PRN
  Administered 2017-12-06: 4 mg via INTRAVENOUS

## 2017-12-06 MED ORDER — ACETAMINOPHEN 500 MG PO TABS
ORAL_TABLET | ORAL | Status: AC
  Filled 2017-12-06: qty 1

## 2017-12-06 MED ORDER — LIDOCAINE-EPINEPHRINE 0.5 %-1:200000 IJ SOLN
INTRAMUSCULAR | Status: AC
  Filled 2017-12-06: qty 1

## 2017-12-06 MED ORDER — LIDOCAINE 2% (20 MG/ML) 5 ML SYRINGE
INTRAMUSCULAR | Status: AC
  Filled 2017-12-06: qty 5

## 2017-12-06 MED ORDER — CHLORHEXIDINE GLUCONATE 4 % EX LIQD: 60.0000 mL | Freq: Once | CUTANEOUS | Status: DC

## 2017-12-06 MED ORDER — FENTANYL CITRATE (PF) 250 MCG/5ML IJ SOLN
INTRAMUSCULAR | Status: DC | PRN
  Administered 2017-12-06: 50 ug via INTRAVENOUS
  Administered 2017-12-06: 25 ug via INTRAVENOUS

## 2017-12-06 MED ORDER — 0.9 % SODIUM CHLORIDE (POUR BTL) OPTIME
TOPICAL | Status: DC | PRN
  Administered 2017-12-06: 1000 mL

## 2017-12-06 MED ORDER — SODIUM CHLORIDE 0.9 % IV SOLN
INTRAVENOUS | Status: DC
  Administered 2017-12-06: 07:00:00 via INTRAVENOUS

## 2017-12-06 MED ORDER — SUCCINYLCHOLINE CHLORIDE 200 MG/10ML IV SOSY
PREFILLED_SYRINGE | INTRAVENOUS | Status: AC
  Filled 2017-12-06: qty 10

## 2017-12-06 MED ORDER — CEFAZOLIN SODIUM-DEXTROSE 2-4 GM/100ML-% IV SOLN
INTRAVENOUS | Status: AC
  Filled 2017-12-06: qty 100

## 2017-12-06 MED ORDER — CEFAZOLIN SODIUM-DEXTROSE 2-4 GM/100ML-% IV SOLN
2.0000 g | INTRAVENOUS | Status: AC
  Administered 2017-12-06: 2 g via INTRAVENOUS

## 2017-12-06 MED ORDER — PROPOFOL 10 MG/ML IV BOLUS
INTRAVENOUS | Status: AC
  Filled 2017-12-06: qty 20

## 2017-12-06 SURGICAL SUPPLY — 33 items
ADH SKN CLS APL DERMABOND .7 (GAUZE/BANDAGES/DRESSINGS) ×1
ARMBAND PINK RESTRICT EXTREMIT (MISCELLANEOUS) ×4 IMPLANT
CANISTER SUCT 3000ML PPV (MISCELLANEOUS) ×3 IMPLANT
CANNULA VESSEL 3MM 2 BLNT TIP (CANNULA) ×3 IMPLANT
CLIP LIGATING EXTRA MED SLVR (CLIP) ×3 IMPLANT
CLIP LIGATING EXTRA SM BLUE (MISCELLANEOUS) ×3 IMPLANT
DECANTER SPIKE VIAL GLASS SM (MISCELLANEOUS) ×3 IMPLANT
DERMABOND ADVANCED (GAUZE/BANDAGES/DRESSINGS) ×2
DERMABOND ADVANCED .7 DNX12 (GAUZE/BANDAGES/DRESSINGS) ×1 IMPLANT
ELECT REM PT RETURN 9FT ADLT (ELECTROSURGICAL) ×3
ELECTRODE REM PT RTRN 9FT ADLT (ELECTROSURGICAL) ×1 IMPLANT
GAUZE SPONGE 4X4 12PLY STRL (GAUZE/BANDAGES/DRESSINGS) ×3 IMPLANT
GLOVE BIOGEL M 6.5 STRL (GLOVE) ×8 IMPLANT
GLOVE BIOGEL PI IND STRL 7.0 (GLOVE) IMPLANT
GLOVE BIOGEL PI INDICATOR 7.0 (GLOVE) ×4
GLOVE SS BIOGEL STRL SZ 7.5 (GLOVE) ×1 IMPLANT
GLOVE SUPERSENSE BIOGEL SZ 7.5 (GLOVE) ×2
GOWN STRL REUS W/ TWL LRG LVL3 (GOWN DISPOSABLE) ×3 IMPLANT
GOWN STRL REUS W/TWL LRG LVL3 (GOWN DISPOSABLE) ×6
GRAFT GORETEX STRT 4-7X45 (Vascular Products) ×2 IMPLANT
KIT BASIN OR (CUSTOM PROCEDURE TRAY) ×3 IMPLANT
KIT TURNOVER KIT B (KITS) ×3 IMPLANT
NS IRRIG 1000ML POUR BTL (IV SOLUTION) ×3 IMPLANT
PACK CV ACCESS (CUSTOM PROCEDURE TRAY) ×3 IMPLANT
PAD ARMBOARD 7.5X6 YLW CONV (MISCELLANEOUS) ×6 IMPLANT
SUT PROLENE 6 0 CC (SUTURE) ×6 IMPLANT
SUT SILK 2 0 PERMA HAND 18 BK (SUTURE) IMPLANT
SUT VIC AB 3-0 SH 27 (SUTURE) ×6
SUT VIC AB 3-0 SH 27X BRD (SUTURE) ×2 IMPLANT
SYR TOOMEY 50ML (SYRINGE) IMPLANT
TOWEL GREEN STERILE (TOWEL DISPOSABLE) ×3 IMPLANT
UNDERPAD 30X30 (UNDERPADS AND DIAPERS) ×3 IMPLANT
WATER STERILE IRR 1000ML POUR (IV SOLUTION) ×3 IMPLANT

## 2017-12-06 NOTE — Op Note (Signed)
    OPERATIVE REPORT  DATE OF SURGERY: 12/06/2017  PATIENT: Valerie Terrell, 84 y.o. female MRN: [id number]  DOB: 1932/06/24  PRE-OPERATIVE DIAGNOSIS: Poorly functioning right upper arm AV fistula  POST-OPERATIVE DIAGNOSIS:  Same  PROCEDURE: Right upper arm AV Gore-Tex graft  SURGEON:  Curt Jews, M.D.  PHYSICIAN ASSISTANT: Liana Crocker, PA-C  ANESTHESIA: Local with sedation  EBL: Minimal ml  Total I/O In: 350 [I.V.:350] Out: -   BLOOD ADMINISTERED: None  DRAINS: None  SPECIMEN: None  COUNTS CORRECT:  YES  PLAN OF CARE: PACU  PATIENT DISPOSITION:  PACU - hemodynamically stable  PROCEDURE DETAILS: The patient was taken to the operating placed supine position where the area of the left arm and left axilla were prepped and draped in usual sterile fashion.  Using local anesthesia incision was made over the prior brachial artery to basilic vein anastomosis.  The patient had a large caliber brachial artery.  The basilic vein was patent at its fistula.  Separate incision was made at the axilla and the basilic vein again was isolated prior to entry into the axillary vein.  The vein was occluded at the brachial artery anastomosis and at the axillary vein and was transected near the axillary vein.  A 5 dilator passed through the basilic vein and there was some stenosis at its entry into the axillary vein.  For this reason the axillary vein was dissected further proximally above this area of stenosis.  A tunnel was created lateral to the existing fistula and a 4 x 7 taper stretch Gore-Tex graft was brought through the tunnel.  The brachial artery was occluded proximally and distal to the venous anastomosis.  The vein was transected at the brachial artery anastomosis leaving a small cuff.  The graft was spatulated at approximately the 5 to 6 mm diameter portion of the graft.  This was sewn end-to-side to the artery with a running 6-0 Prolene suture.  This anastomosis was tested and found to  be adequate.  The axillary vein was occluded proximally and was spatulated.  The graft was go to the appropriate dimension and spatulated and sewn into into the axillary vein with a running 6-0 Prolene suture.  Clamps were removed and excellent throat thrill was noted.  The patient maintained a radial pulse.  Saline.  Hemostasis talus cautery.  The wounds were closed with 3-0 Vicryl in the subcutaneous and subcuticular tissue.  Sterile dressing was applied and the patient was transferred to the recovery room in stable condition   [name], M.D., [facility] 12/06/2017 9:05 AM

## 2017-12-06 NOTE — Interval H&P Note (Signed)
History and Physical Interval Note:  12/06/2017 7:16 AM  Valerie Terrell  has presented today for surgery, with the diagnosis of END STAGE RENAL DISEASE FOR HEMODIALYSIS ACCESS  The various methods of treatment have been discussed with the patient and family. After consideration of risks, benefits and other options for treatment, the patient has consented to  Procedure(s): INSERTION OF ARTERIOVENOUS (AV) GORE-TEX GRAFT ARM RIGHT UPPER EXTREMITY (Right) as a surgical intervention .  The patient's history has been reviewed, patient examined, no change in status, stable for surgery.  I have reviewed the patient's chart and labs.  Questions were answered to the patient's satisfaction.     Curt Jews

## 2017-12-06 NOTE — Discharge Instructions (Signed)
[facility]  Discharge Instructions  AV Fistula or Graft Surgery for Dialysis Access  Please refer to the following instructions for your post-procedure care. Your surgeon or physician assistant will discuss any changes with you.  Activity  You may drive the day following your surgery, if you are comfortable and no longer taking prescription pain medication. Resume full activity as the soreness in your incision resolves.  Bathing/Showering  You may shower after you go home. Keep your incision dry for 48 hours. Do not soak in a bathtub, hot tub, or swim until the incision heals completely. You may not shower if you have a hemodialysis catheter.  Incision Care  Clean your incision with mild soap and water after 48 hours. Pat the area dry with a clean towel. You do not need a bandage unless otherwise instructed. Do not apply any ointments or creams to your incision. You may have skin glue on your incision. Do not peel it off. It will come off on its own in about one week. Your arm may swell a bit after surgery. To reduce swelling use pillows to elevate your arm so it is above your heart. Your doctor will tell you if you need to lightly wrap your arm with an ACE bandage.  Diet  Resume your normal diet. There are not special food restrictions following this procedure. In order to heal from your surgery, it is CRITICAL to get adequate nutrition. Your body requires vitamins, minerals, and protein. Vegetables are the best source of vitamins and minerals. Vegetables also provide the perfect balance of protein. Processed food has little nutritional value, so try to avoid this.  Medications  Resume taking all of your medications. If your incision is causing pain, you may take over-the counter pain relievers such as acetaminophen (Tylenol). If you were prescribed a stronger pain medication, please be aware these medications can cause nausea and constipation. Prevent  nausea by taking the medication with a snack or meal. Avoid constipation by drinking plenty of fluids and eating foods with high amount of fiber, such as fruits, vegetables, and grains.  Do not take Tylenol if you are taking prescription pain medications.  Follow up Your surgeon may want to see you in the office following your access surgery. If so, this will be arranged at the time of your surgery.  Please call us immediately for any of the following conditions:  Increased pain, redness, drainage (pus) from your incision site Fever of 101 degrees or higher Severe or worsening pain at your incision site Hand pain or numbness.  Reduce your risk of vascular disease:  Stop smoking. If you would like help, call [facility] at 1-800-QUIT-NOW 425-469-3671) or [place] [facility] [place] your cholesterol Maintain a desired weight Control your diabetes Keep your blood pressure down  Dialysis  It will take several weeks to several months for your new dialysis access to be ready for use. Your surgeon will determine when it is okay to use it. Your nephrologist will continue to direct your dialysis. You can continue to use your Permcath until your new access is ready for use.   12/06/2017 Valerie Terrell [id number] 11/25/1932  Surgeon(s): Early, Arvilla Meres, MD  Procedure(s): INSERTION OF ARTERIOVENOUS (AV) GORE-TEX GRAFT ARM RIGHT UPPER EXTREMITY  x Do not stick graft for 4 weeks    If you have any questions, please call the office at [phone].   Post Anesthesia Home Care Instructions  Activity: Get plenty of rest  for the remainder of the day. A responsible individual must stay with you for 24 hours following the procedure.  For the next 24 hours, DO NOT: -Drive a car -Paediatric nurse -Drink alcoholic beverages -Take any medication unless instructed by your physician -Make any legal decisions or sign important papers.  Meals: Start with liquid foods such as gelatin  or soup. Progress to regular foods as tolerated. Avoid greasy, spicy, heavy foods. If nausea and/or vomiting occur, drink only clear liquids until the nausea and/or vomiting subsides. Call your physician if vomiting continues.  Special Instructions/Symptoms: Your throat may feel dry or sore from the anesthesia or the breathing tube placed in your throat during surgery. If this causes discomfort, gargle with warm salt water. The discomfort should disappear within 24 hours.  If you had a scopolamine patch placed behind your ear for the management of post- operative nausea and/or vomiting:  1. The medication in the patch is effective for 72 hours, after which it should be removed.  Wrap patch in a tissue and discard in the trash. Wash hands thoroughly with soap and water. 2. You may remove the patch earlier than 72 hours if you experience unpleasant side effects which may include dry mouth, dizziness or visual disturbances. 3. Avoid touching the patch. Wash your hands with soap and water after contact with the patch.

## 2017-12-06 NOTE — Anesthesia Postprocedure Evaluation (Signed)
Anesthesia Post Note  Patient: Valerie Terrell  Procedure(s) Performed: INSERTION OF ARTERIOVENOUS (AV) GORE-TEX GRAFT ARM RIGHT UPPER EXTREMITY (Right )     Patient location during evaluation: PACU Anesthesia Type: MAC Level of consciousness: awake and alert Pain management: pain level controlled Vital Signs Assessment: post-procedure vital signs reviewed and stable Respiratory status: spontaneous breathing, nonlabored ventilation, respiratory function stable and patient connected to nasal cannula oxygen Cardiovascular status: stable and blood pressure returned to baseline Postop Assessment: no apparent nausea or vomiting Anesthetic complications: no    Last Vitals:  Vitals:   12/06/17 0922 12/06/17 0942  BP: (!) 152/60 (!) 158/60  Pulse: 68 72  Resp: 16 15  Temp:    SpO2: 100% 98%    Last Pain:  Vitals:   12/06/17 0942  PainSc: 0-No pain                 Kassidie Hendriks DAVID

## 2017-12-06 NOTE — Anesthesia Preprocedure Evaluation (Signed)
Anesthesia Evaluation  Patient identified by MRN, date of birth, ID band Patient awake    Reviewed: Allergy & Precautions, NPO status , Patient's Chart, lab work & pertinent test results  Airway Mallampati: I  TM Distance: >3 FB Neck ROM: Full    Dental   Pulmonary former smoker,    Pulmonary exam normal        Cardiovascular hypertension, Pt. on medications +CHF  Normal cardiovascular exam+ dysrhythmias Atrial Fibrillation + pacemaker      Neuro/Psych CVA    GI/Hepatic   Endo/Other    Renal/GU ESRF and DialysisRenal disease     Musculoskeletal   Abdominal   Peds  Hematology   Anesthesia Other Findings   Reproductive/Obstetrics                             Anesthesia Physical Anesthesia Plan  ASA: III  Anesthesia Plan: MAC   Post-op Pain Management:    Induction: Intravenous  PONV Risk Score and Plan: 2  Airway Management Planned: Simple Face Mask  Additional Equipment:   Intra-op Plan:   Post-operative Plan:   Informed Consent: I have reviewed the patients History and Physical, chart, labs and discussed the procedure including the risks, benefits and alternatives for the proposed anesthesia with the patient or authorized representative who has indicated his/her understanding and acceptance.     Plan Discussed with: CRNA and Surgeon  Anesthesia Plan Comments:         Anesthesia Quick Evaluation

## 2017-12-06 NOTE — Transfer of Care (Signed)
Immediate Anesthesia Transfer of Care Note  Patient: Valerie Terrell  Procedure(s) Performed: INSERTION OF ARTERIOVENOUS (AV) GORE-TEX GRAFT ARM RIGHT UPPER EXTREMITY (Right )  Patient Location: PACU  Anesthesia Type:MAC  Level of Consciousness: awake, alert , oriented and patient cooperative  Airway & Oxygen Therapy: Patient Spontanous Breathing and Patient connected to nasal cannula oxygen  Post-op Assessment: Report given to RN, Post -op Vital signs reviewed and stable and Patient moving all extremities  Post vital signs: Reviewed and stable  Last Vitals:  Vitals Value Taken Time  BP 134/66 12/06/2017  9:07 AM  Temp    Pulse 65 12/06/2017  9:13 AM  Resp 21 12/06/2017  9:13 AM  SpO2 95 % 12/06/2017  9:13 AM  Vitals shown include unvalidated device data.  Last Pain:  Vitals:   12/06/17 0907  PainSc: (P) 2       Patients Stated Pain Goal: 0 (82/99/37 1696)  Complications: No apparent anesthesia complications

## 2017-12-07 ENCOUNTER — Encounter (HOSPITAL_COMMUNITY): Payer: Self-pay | Admitting: Vascular Surgery

## 2017-12-07 DIAGNOSIS — D631 Anemia in chronic kidney disease: Secondary | ICD-10-CM | POA: Diagnosis not present

## 2017-12-07 DIAGNOSIS — N186 End stage renal disease: Secondary | ICD-10-CM | POA: Diagnosis not present

## 2017-12-07 DIAGNOSIS — N2581 Secondary hyperparathyroidism of renal origin: Secondary | ICD-10-CM | POA: Diagnosis not present

## 2017-12-07 DIAGNOSIS — E876 Hypokalemia: Secondary | ICD-10-CM | POA: Diagnosis not present

## 2017-12-07 DIAGNOSIS — D689 Coagulation defect, unspecified: Secondary | ICD-10-CM | POA: Diagnosis not present

## 2017-12-07 DIAGNOSIS — Z4901 Encounter for fitting and adjustment of extracorporeal dialysis catheter: Secondary | ICD-10-CM | POA: Diagnosis not present

## 2017-12-08 ENCOUNTER — Telehealth: Payer: Self-pay | Admitting: Cardiology

## 2017-12-08 ENCOUNTER — Ambulatory Visit (INDEPENDENT_AMBULATORY_CARE_PROVIDER_SITE_OTHER): Payer: Medicare Other | Admitting: *Deleted

## 2017-12-08 DIAGNOSIS — I495 Sick sinus syndrome: Secondary | ICD-10-CM | POA: Diagnosis not present

## 2017-12-08 NOTE — Telephone Encounter (Signed)
LMOVM reminding pt to send remote transmission.   

## 2017-12-09 ENCOUNTER — Encounter: Payer: Self-pay | Admitting: Cardiology

## 2017-12-09 DIAGNOSIS — D631 Anemia in chronic kidney disease: Secondary | ICD-10-CM | POA: Diagnosis not present

## 2017-12-09 DIAGNOSIS — E876 Hypokalemia: Secondary | ICD-10-CM | POA: Diagnosis not present

## 2017-12-09 DIAGNOSIS — N186 End stage renal disease: Secondary | ICD-10-CM | POA: Diagnosis not present

## 2017-12-09 DIAGNOSIS — D689 Coagulation defect, unspecified: Secondary | ICD-10-CM | POA: Diagnosis not present

## 2017-12-09 DIAGNOSIS — N2581 Secondary hyperparathyroidism of renal origin: Secondary | ICD-10-CM | POA: Diagnosis not present

## 2017-12-09 DIAGNOSIS — Z4901 Encounter for fitting and adjustment of extracorporeal dialysis catheter: Secondary | ICD-10-CM | POA: Diagnosis not present

## 2017-12-09 NOTE — Progress Notes (Signed)
Remote pacemaker transmission.   

## 2017-12-09 NOTE — Progress Notes (Signed)
Cardiology Office Note Date:  12/10/2017  Patient ID:  Valerie Terrell, DOB 1933/02/27, MRN [id number] PCP:  Tammi Sou, MD  Cardiologist:  Dr. Gwenlyn Found Electrophysiologist: Dr. Lovena Le   Chief Complaint: PPM check   History of Present Illness: Valerie Terrell is a 84 y.o. female with extensive medical history including ESRF on HD, stroke, HTN, HLD, AFib, symptomatic bradycardia w/PPM, h/o SAH (traumatic on Xarelto), on Eliquis suffered a retinal artery occl and changed to warfarin, functional decline, chronic CHF (systolic), chronic hypoxia.  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 Lovena Le.  She last saw him in Oct 2018, at that time was routine 62mo post implant visit, noted to have been started on HD only a few weeks prior.  She was maintaining SR and pacer function was intact, no changes were made.   She has subsequently undergone AV Gore-Tex graft RUE on 12/06/17 with Dr. Donnetta Hutching.  Cardiac-wise, she has had no issues, no CP, palpitations, no SOB, no dizziness, near syncope or syncope.  She is accompanied by her daughter , they both relay significant concern regarding her post-op course after her surgery Monday.  She describes having significant pain, swelling/bruising to the RUE.  She resumed her warfarin Monday and lovenox Tuesday.  A quick INR check today is 1.5  Device information: SJM dual chamber PPM, implanted July 2018   Past Medical History:  Diagnosis Date  . Acute kidney injury superimposed on CKD ([place]) 08/05/2016  . Acute on chronic respiratory failure with hypoxia ([place])   . Acute on chronic systolic congestive heart failure ([place])   . Acute pulmonary edema with congestive heart failure ([place]) 11/30/2016  . Acute renal failure superimposed on chronic kidney disease ([place]) 07/11/2016  . Acute respiratory failure ([place]) 02/13/2017  . Anemia of chronic disease 2018   + anemia of CRI?  [name] Atrial fibrillation ([place]) [I48.91] 12/11/2016  . Atrophy of left  kidney    with absent blood flow by renal artery dopplers (Dr. Gwenlyn Found)  . 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 Chronic combined systolic and diastolic CHF (congestive heart failure) ([place])    a. 11/2016: echo showing EF of 35-40%, RV strain noted, mild MR and mild TR.   [name] Chronic combined systolic and diastolic heart failure ([place]) 01/05/2017  . Chronic hypoxemic respiratory failure ([place])   . Chronic respiratory failure with hypoxia ([place]) 02/11/2017   Now on chronic O2  . CKD (chronic kidney disease), stage IV ([place]) 01/28/2016  . Debilitated patient    WC dependent as of 2018  . Dysrhythmia    afib  . Elevated troponin level 11/30/2016  . ESRD (end stage renal disease) ([place]) 02/13/2017  . ESRD on hemodialysis ([place]) 01/2017   T/Th/Sat schedule- [place].  Right basilic AV fistula 05/6008--[facility] unable to successfully access this.  Dr. Donnetta Hutching to do an upper arm AV graft as of 10/2017..  . Family history of adverse reaction to anesthesia    Daughter- nausea  . History of adenomatous polyp of colon   . History of kidney stones    passed  . History of subarachnoid hemorrhage 10/2014   after syncope and while on xarelto  . HOH (hard of hearing)   . Hyperlipidemia   . Hypertension    Difficult to control, in the setting of one functioning kidney: pt was referred to nephrology by Dr. Gwenlyn Found 06/2015.  . Lumbar radiculopathy 2012  . Lumbar spinal stenosis 2019  facet injections only very short term relief.  As of 09/2017 ortho f/u, selective nerve root block planned.  . Lumbar spondylosis    MR 07/2016---no sign of spinal nerve compression or cord compression.  Pt set up with outpt ortho while admitted to hosp 07/2016.  . Malnourished ([place])   . Metatarsal fracture 06/10/2016   Nondisplaced, left 5th metatarsal--pt was referred to ortho  . Nonischemic cardiomyopathy ([place])   . Osteopenia 2014   T-score -2.1  . PAF (paroxysmal atrial fibrillation) ([facility])    Eliquis started after BRAO  and CVA.  She was changed to warfarin 2018--INR/coumadin mgmt per HD/nephrol.  . Palpitations 12/15/2016  . Personal history of subarachnoid hemorrhage 01/28/2016  . Presence of permanent cardiac pacemaker   . Pulmonary edema 11/2016  . 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 Right rib fracture 12/2016   s/p fall  . Sick sinus syndrome ([place])    Dual chamber pacer insertion 2018 (Dr. Lovena Le)  . Stroke [facility])    cardioembolic (had CVA while on no anticoag)--"scattered subacute punctate infarcts: 1 in R parietal lobe and 2 in occipital cortex" on MRI br.  CT angio head/neck: aortic arch athero.   . Tachycardia-bradycardia syndrome ([place]) - s/p PPM 12/07/2016   Pacer placed  . Thoracic back pain 01/2017   Facet?  Dr. Nelva Bush to do steroid injection as of 02/2017  . Thoracic compression fracture ([place]) 07/2016   T7 and T8-- T8 kyphoplasty during hosp admission 07/2016.  Neuro referred pt to pain mgmt for consideration of injection 09/2016.  I referred her to endo 08/2016 for consideration of calcitonin treatment.    Past Surgical History:  Procedure Laterality Date  . APPENDECTOMY  child  . AV FISTULA PLACEMENT Right 03/31/2017   Procedure: Right ARM Basilic vein transposition;  Surgeon: Conrad Yakima, MD;  Location: [facility];  Service: Vascular;  Laterality: Right;  . AV FISTULA PLACEMENT Right 12/06/2017   Procedure: INSERTION OF ARTERIOVENOUS (AV) GORE-TEX GRAFT ARM RIGHT UPPER EXTREMITY;  Surgeon: [name], MD;  Location: [facility];  Service: Vascular;  Laterality: Right;  . CARDIOVASCULAR STRESS TEST  01/31/2016   Stress myoview: NORMAL/Low risk.  EF 56%.  . [place]  . COLONOSCOPY  2015   + hx of adenomatous polyps.  Need digest health spec in [place] records to see when pt due for next colonoscopy  . EYE SURGERY Bilateral    Catarct  . INSERTION OF DIALYSIS CATHETER Right 01/29/2017   Procedure: INSERTION OF DIALYSIS CATHETER- RIGHT INTERNAL JUGULAR;  Surgeon:  [name], MD;  Location: [facility] OR;  Service: Vascular;  Laterality: Right;  . IR GENERIC HISTORICAL  07/24/2016   IR KYPHO THORACIC WITH BONE BIOPSY 07/24/2016 Luanne Bras, MD [facility]-INTERV RAD  . KYPHOPLASTY  07/27/2016   T8  . PACEMAKER IMPLANT N/A 12/03/2016   Procedure: Pacemaker Implant;  Surgeon: Evans Lance, MD;  Location: [facility] CV LAB;  Service: Cardiovascular;  Laterality: N/A;  . Renal artery dopplers  02/26/2016   Her right renal dimension was 11 cm pole to pole with mild to moderate right renal artery stenosis. A right renal aortic ratio was 3.22 suggesting less than a 50% stenosis.  . TONSILLECTOMY    . TRANSTHORACIC ECHOCARDIOGRAM  01/29/2016; 11/2016   2017: EF 55-60%, normal LV wall motion, grade I DD.  No cardiac source of emboli was seen.  2018: EF 35-40%, could not assess DD due to a-fib, pulm HTN  noted.    Current Outpatient Medications  Medication Sig Dispense Refill  . acetaminophen (TYLENOL) 500 MG tablet Take 500-1,000 mg 3 (three) times daily as needed by mouth for moderate pain.     [name] amiodarone (PACERONE) 200 MG tablet Take 1 tablet (200 mg total) by mouth daily. KEEP OV. 90 tablet 0  . carvedilol (COREG) 3.125 MG tablet Take 1 tablet (3.125 mg total) by mouth 2 (two) times daily. 30 tablet 0  . enoxaparin (LOVENOX) 60 MG/0.6ML injection Inject 0.6 mLs (60 mg total) into the skin daily. 10 Syringe 0  . HYDROcodone-acetaminophen (NORCO) 5-325 MG tablet Take 1 tablet by mouth every 6 (six) hours as needed for moderate pain or severe pain. 10 tablet 0  . isosorbide mononitrate (IMDUR) 60 MG 24 hr tablet TAKE 1 TABLET DAILY BY  MOUTH. 90 tablet 1  . ondansetron (ZOFRAN) 4 MG tablet Take 0.5-1 tablets (2-4 mg total) by mouth every 8 (eight) hours as needed for nausea or vomiting. 90 tablet 3  . polyethylene glycol (MIRALAX / GLYCOLAX) packet Take 17 g by mouth daily as needed for mild constipation.    . pravastatin (PRAVACHOL) 40 MG tablet TAKE 1 TABLET EVERY  EVENING BY MOUTH. 90 tablet 0  . sevelamer carbonate (RENVELA) 800 MG tablet Take 800 mg by mouth 2 (two) times daily with a meal.    . vitamin C (ASCORBIC ACID) 500 MG tablet Take 500 mg by mouth daily.    . Vitamin D, Ergocalciferol, (DRISDOL) 50000 units CAPS capsule Take 1 capsule (50,000 Units total) by mouth every 7 (seven) days. (Patient taking differently: Take 50,000 Units by mouth every Thursday. ) 12 capsule 3  . warfarin (COUMADIN) 3 MG tablet Take 1/2 to 1 tablets daily as directed by coumadin clinic (Patient taking differently: Take 1.5-3 mg by mouth See admin instructions. Take 3 mg by mouth daily on Monday, Wednesday and Friday. Take 1.5 mg by mouth daily on all other days) 90 tablet 1   No current facility-administered medications for this visit.     Allergies:   Clonidine derivatives; Adhesive [tape]; Codeine; Nickel; and Sulfa antibiotics   Social History:  The patient  reports that she quit smoking about 45 years ago. She quit after 22.00 years of use. She has never used smokeless tobacco. She reports that she drinks about 0.6 oz of alcohol per week. She reports that she does not use drugs.   Family History:  The patient's family history includes Cancer in her mother; Early death in her father; Heart disease in her father.  ROS:  Please see the history of present illness.    All other systems are reviewed and otherwise negative.   PHYSICAL EXAM:  VS:  BP (!) 110/52   Pulse 74   Ht 4\' 11"  (1.499 m)   Wt 121 lb (54.9 kg)   SpO2 90%   BMI 24.44 kg/m  BMI: Body mass index is 24.44 kg/m. Well nourished, petite, well developed, in no acute distress  HEENT: normocephalic, atraumatic  Neck: no JVD, carotid bruits or masses Cardiac:  RRR; no significant murmurs, no rubs, or gallops Lungs:  CTA b/l, no wheezing, rhonchi or rales  Abd: soft, nontender MS: no deformity, age appropriate atrophy Ext:  No LE edema, she has significant bruising to RUE with swelling, though not  firm, she has reports tenderness to area surrounding surgical wound with a localized area of firmness lateral to site.  No bleeding, drainage, no increased heat to the  tissues, R radial pulse is not palpable but is present by doppler, + cap refill, no hand swelling Skin: warm and dry, no rash Neuro:  No gross deficits appreciated Psych: euthymic mood, full affect  PPM site is stable, no tethering or discomfort   EKG:  Not done today PPM interrogation done today and reviewed by myself: battery and lead testing are good, she had 4 AMS episodes that were noise associated with her surgical dates July 22, and back in Nov.  She presents in SR (AS/VS), hasnot had any AF, AP 24%, VP <1%   12/02/16: TTE Study Conclusions - Left ventricle: The cavity size was normal. There was moderate   concentric hypertrophy. Systolic function was moderately reduced.   The estimated ejection fraction was in the range of 35% to 40%.   Wall motion was normal; there were no regional wall motion   abnormalities. The study was not technically sufficient to allow   evaluation of LV diastolic dysfunction due to atrial   fibrillation. - Aortic valve: Valve mobility was restricted. Transvalvular   velocity was within the normal range. There was no stenosis.   There was no regurgitation. - Mitral valve: There was mild regurgitation. - Left atrium: The atrium was mildly dilated. - Right ventricle: The cavity size was moderately dilated. Wall   thickness was normal. Systolic function was moderately reduced. - Tricuspid valve: There was mild regurgitation. - Pulmonary arteries: Systolic pressure was moderately increased.   PA peak pressure: 47 mm Hg (S). - Inferior vena cava: The vessel was normal in size. - Pericardium, extracardiac: A trivial pericardial effusion was   identified posterior to the heart. Features were not consistent   with tamponade physiology. Impressions: - When compared to the prior study from  07/14/2016 LVEF has   decreased from 60-65% to 35-40%. RV is moderately dilated with   moderately decreased RVEF. There is RV strain. Consider workup   for pulmonary embolism.   The study acquired while the patient in atrial fibrillation with   RVR.   Recent Labs: 12/15/2016: TSH 3.563 01/19/2017: Magnesium 2.2 02/13/2017: ALT 23; B Natriuretic Peptide 1,035.7 02/18/2017: BUN 38; Creatinine, Ser 4.08; Platelets 171 12/06/2017: Hemoglobin 11.2; Potassium 3.4; Sodium 138  No results found for requested labs within last 8760 hours.   CrCl cannot be calculated (Patient's most recent lab result is older than the maximum 21 days allowed.).   Wt Readings from Last 3 Encounters:  12/10/17 121 lb (54.9 kg)  12/03/17 121 lb 14.6 oz (55.3 kg)  11/16/17 122 lb (55.3 kg)     Other studies reviewed: Additional studies/records reviewed today include: summarized above  ASSESSMENT AND PLAN:  1. PPM     Intact function  2. Paroxysmal AFib     CHA2DS2Vasc is 6,  On warfarin     INR today done given post-op bruising, and lovenox/warfarin, her INR only 1.5. She has had previous embolic events, and agree from this perspective with ongoing Lovenox until therapeutic Her coumadin clinic f/u was moved up to Monday, has lovenox through Sunday at home apparently  I have reached out to Dr. Luther Parody office, given not our specialty, she is on POD #4, uncertain what her expected post-op course/findings would/should be, they will see her today as a work-in appt, the patient and daughter state they can make that appointment.  3. HTN     Looks OK, no changes   Disposition: F/u with Q3 mo remotes and an in-clinic device visit in 1 year,  sooner if needed.  Current medicines are reviewed at length with the patient today.  The patient did not have any concerns regarding medicines.  Venetia Night, PA-C 12/10/2017 12:44 PM     [address] [place] [id number] [phone] (office)  [phone] (fax)

## 2017-12-10 ENCOUNTER — Other Ambulatory Visit: Payer: Self-pay

## 2017-12-10 ENCOUNTER — Ambulatory Visit (INDEPENDENT_AMBULATORY_CARE_PROVIDER_SITE_OTHER): Payer: Self-pay | Admitting: Vascular Surgery

## 2017-12-10 ENCOUNTER — Encounter: Payer: Self-pay | Admitting: Vascular Surgery

## 2017-12-10 ENCOUNTER — Ambulatory Visit (INDEPENDENT_AMBULATORY_CARE_PROVIDER_SITE_OTHER): Payer: Medicare Other | Admitting: Internal Medicine

## 2017-12-10 ENCOUNTER — Ambulatory Visit (INDEPENDENT_AMBULATORY_CARE_PROVIDER_SITE_OTHER): Payer: Medicare Other | Admitting: Physician Assistant

## 2017-12-10 ENCOUNTER — Encounter: Payer: Self-pay | Admitting: Physician Assistant

## 2017-12-10 VITALS — BP 110/52 | HR 74 | Ht 59.0 in | Wt 121.0 lb

## 2017-12-10 VITALS — BP 154/60 | HR 73 | Temp 97.4°F | Resp 14 | Ht 59.0 in | Wt 121.0 lb

## 2017-12-10 DIAGNOSIS — I480 Paroxysmal atrial fibrillation: Secondary | ICD-10-CM

## 2017-12-10 DIAGNOSIS — I4891 Unspecified atrial fibrillation: Secondary | ICD-10-CM | POA: Diagnosis not present

## 2017-12-10 DIAGNOSIS — Z7901 Long term (current) use of anticoagulants: Secondary | ICD-10-CM | POA: Diagnosis not present

## 2017-12-10 DIAGNOSIS — I10 Essential (primary) hypertension: Secondary | ICD-10-CM | POA: Diagnosis not present

## 2017-12-10 DIAGNOSIS — Z950 Presence of cardiac pacemaker: Secondary | ICD-10-CM | POA: Diagnosis not present

## 2017-12-10 DIAGNOSIS — N186 End stage renal disease: Secondary | ICD-10-CM

## 2017-12-10 DIAGNOSIS — Z992 Dependence on renal dialysis: Secondary | ICD-10-CM

## 2017-12-10 LAB — POCT INR: INR: 1.5 — AB (ref 2.0–3.0)

## 2017-12-10 NOTE — Patient Instructions (Addendum)
Medication Instructions:  Your physician recommends that you continue on your current medications as directed. Please refer to the Current Medication list given to you today.   Labwork: None ordered  Testing/Procedures: None ordered  Follow-Up:  BE AT DR. EARLY'S OFFICE AT 2:30 TODAY TO SEE DR. CAIN   Your physician wants you to follow-up in: [place] Lovena Le / REN[place]  You will receive a reminder letter in the mail two months in advance. If you don't receive a letter, please call our office to schedule the follow-up appointment.   Any Other Special Instructions Will Be Listed Below (If Applicable).     If you need a refill on your cardiac medications before your next appointment, please call your pharmacy.

## 2017-12-10 NOTE — Progress Notes (Signed)
  Subjective:     Patient ID: Chiquita Heckert, female   DOB: 09/06/1932, 84 y.o.   MRN: [id number]  HPI 84 year old female follows up 4 days after right upper arm AV grafting.  She states that she has swelling and pain in the right arm at the site of the surgery as well as bruising.  She has been on Lovenox blood thinner.  She does have some coolness and tingling of her distal fingers but overall her hand feels okay.  She has not had any fevers or chills.  She continues to dialyze via catheter.   Review of Systems Right upper arm swelling Coolness of right hand and numbness of fingertips.    Objective:   Physical Exam Awake alert oriented Strong thrill in right upper arm AV graft Right hand is cool but motor intact.    Assessment/plan     84 year old female with recent right arm AV graft placement.  She does have a hematoma of the upper arm which is her chief complaint she also has coolness in her hand without palpable pulses but her hand appears to be motor intact.  I offered her to follow-up in a few weeks for wound check but she wants to call only if she needs to be seen again.  Continue dialysis via the catheter until hematoma has resolved.  Graft should be usable in a few weeks given that has very strong thrill.  Vanellope Passmore C. Donzetta Matters, MD [name] Office: [phone] Pager: [phone]

## 2017-12-10 NOTE — Patient Instructions (Signed)
Description   Continue with 1/2 tablet daily except 1 tablet every Monday and Friday. Continue instructions with Lovenox  instructions for bridging until Monday.  Repeat INR on Monday.

## 2017-12-11 DIAGNOSIS — D689 Coagulation defect, unspecified: Secondary | ICD-10-CM | POA: Diagnosis not present

## 2017-12-11 DIAGNOSIS — Z4901 Encounter for fitting and adjustment of extracorporeal dialysis catheter: Secondary | ICD-10-CM | POA: Diagnosis not present

## 2017-12-11 DIAGNOSIS — N2581 Secondary hyperparathyroidism of renal origin: Secondary | ICD-10-CM | POA: Diagnosis not present

## 2017-12-11 DIAGNOSIS — N186 End stage renal disease: Secondary | ICD-10-CM | POA: Diagnosis not present

## 2017-12-11 DIAGNOSIS — E876 Hypokalemia: Secondary | ICD-10-CM | POA: Diagnosis not present

## 2017-12-11 DIAGNOSIS — D631 Anemia in chronic kidney disease: Secondary | ICD-10-CM | POA: Diagnosis not present

## 2017-12-13 ENCOUNTER — Ambulatory Visit (INDEPENDENT_AMBULATORY_CARE_PROVIDER_SITE_OTHER): Payer: Medicare Other | Admitting: *Deleted

## 2017-12-13 DIAGNOSIS — Z7901 Long term (current) use of anticoagulants: Secondary | ICD-10-CM

## 2017-12-13 DIAGNOSIS — I4891 Unspecified atrial fibrillation: Secondary | ICD-10-CM

## 2017-12-13 LAB — POCT INR: INR: 1.9 — AB (ref 2.0–3.0)

## 2017-12-13 NOTE — Patient Instructions (Signed)
Description   Today take 1.5 tablets, tomorrow take 1 tablet, then Continue with 1/2 tablet daily except 1 tablet every Monday and Friday. Discontinue Lovenox injections.   Repeat INR on Wednesday.

## 2017-12-14 ENCOUNTER — Emergency Department (HOSPITAL_COMMUNITY): Payer: Medicare Other

## 2017-12-14 ENCOUNTER — Inpatient Hospital Stay (HOSPITAL_COMMUNITY)
Admission: EM | Admit: 2017-12-14 | Discharge: 2017-12-16 | DRG: 640 | Disposition: A | Discharge status: Home / Self Care | Payer: Medicare Other | Attending: Internal Medicine | Admitting: Internal Medicine

## 2017-12-14 ENCOUNTER — Other Ambulatory Visit: Payer: Self-pay

## 2017-12-14 ENCOUNTER — Encounter (HOSPITAL_COMMUNITY): Payer: Self-pay | Admitting: Emergency Medicine

## 2017-12-14 DIAGNOSIS — Z86010 Personal history of colonic polyps: Secondary | ICD-10-CM | POA: Diagnosis not present

## 2017-12-14 DIAGNOSIS — H9190 Unspecified hearing loss, unspecified ear: Secondary | ICD-10-CM | POA: Diagnosis not present

## 2017-12-14 DIAGNOSIS — G4733 Obstructive sleep apnea (adult) (pediatric): Secondary | ICD-10-CM | POA: Diagnosis present

## 2017-12-14 DIAGNOSIS — Z87891 Personal history of nicotine dependence: Secondary | ICD-10-CM

## 2017-12-14 DIAGNOSIS — M48061 Spinal stenosis, lumbar region without neurogenic claudication: Secondary | ICD-10-CM | POA: Diagnosis not present

## 2017-12-14 DIAGNOSIS — Z992 Dependence on renal dialysis: Secondary | ICD-10-CM

## 2017-12-14 DIAGNOSIS — I272 Pulmonary hypertension, unspecified: Secondary | ICD-10-CM | POA: Diagnosis present

## 2017-12-14 DIAGNOSIS — I5041 Acute combined systolic (congestive) and diastolic (congestive) heart failure: Secondary | ICD-10-CM | POA: Diagnosis present

## 2017-12-14 DIAGNOSIS — Z9981 Dependence on supplemental oxygen: Secondary | ICD-10-CM | POA: Diagnosis not present

## 2017-12-14 DIAGNOSIS — N186 End stage renal disease: Secondary | ICD-10-CM | POA: Diagnosis present

## 2017-12-14 DIAGNOSIS — Z79899 Other long term (current) drug therapy: Secondary | ICD-10-CM

## 2017-12-14 DIAGNOSIS — N261 Atrophy of kidney (terminal): Secondary | ICD-10-CM | POA: Diagnosis present

## 2017-12-14 DIAGNOSIS — J9601 Acute respiratory failure with hypoxia: Secondary | ICD-10-CM | POA: Diagnosis not present

## 2017-12-14 DIAGNOSIS — E8770 Fluid overload, unspecified: Secondary | ICD-10-CM | POA: Diagnosis not present

## 2017-12-14 DIAGNOSIS — Z8673 Personal history of transient ischemic attack (TIA), and cerebral infarction without residual deficits: Secondary | ICD-10-CM | POA: Diagnosis not present

## 2017-12-14 DIAGNOSIS — I132 Hypertensive heart and chronic kidney disease with heart failure and with stage 5 chronic kidney disease, or end stage renal disease: Secondary | ICD-10-CM | POA: Diagnosis present

## 2017-12-14 DIAGNOSIS — E8889 Other specified metabolic disorders: Secondary | ICD-10-CM | POA: Diagnosis present

## 2017-12-14 DIAGNOSIS — J9811 Atelectasis: Secondary | ICD-10-CM | POA: Diagnosis not present

## 2017-12-14 DIAGNOSIS — I495 Sick sinus syndrome: Secondary | ICD-10-CM | POA: Diagnosis not present

## 2017-12-14 DIAGNOSIS — M4854XA Collapsed vertebra, not elsewhere classified, thoracic region, initial encounter for fracture: Secondary | ICD-10-CM | POA: Diagnosis present

## 2017-12-14 DIAGNOSIS — R0600 Dyspnea, unspecified: Secondary | ICD-10-CM

## 2017-12-14 DIAGNOSIS — Z888 Allergy status to other drugs, medicaments and biological substances status: Secondary | ICD-10-CM

## 2017-12-14 DIAGNOSIS — Z7901 Long term (current) use of anticoagulants: Secondary | ICD-10-CM

## 2017-12-14 DIAGNOSIS — N2581 Secondary hyperparathyroidism of renal origin: Secondary | ICD-10-CM | POA: Diagnosis present

## 2017-12-14 DIAGNOSIS — I340 Nonrheumatic mitral (valve) insufficiency: Secondary | ICD-10-CM | POA: Diagnosis not present

## 2017-12-14 DIAGNOSIS — D631 Anemia in chronic kidney disease: Secondary | ICD-10-CM | POA: Diagnosis not present

## 2017-12-14 DIAGNOSIS — Z950 Presence of cardiac pacemaker: Secondary | ICD-10-CM

## 2017-12-14 DIAGNOSIS — I480 Paroxysmal atrial fibrillation: Secondary | ICD-10-CM | POA: Diagnosis not present

## 2017-12-14 DIAGNOSIS — R0902 Hypoxemia: Secondary | ICD-10-CM

## 2017-12-14 DIAGNOSIS — E785 Hyperlipidemia, unspecified: Secondary | ICD-10-CM | POA: Diagnosis present

## 2017-12-14 DIAGNOSIS — E876 Hypokalemia: Secondary | ICD-10-CM | POA: Diagnosis not present

## 2017-12-14 DIAGNOSIS — J9621 Acute and chronic respiratory failure with hypoxia: Secondary | ICD-10-CM | POA: Diagnosis present

## 2017-12-14 DIAGNOSIS — Z882 Allergy status to sulfonamides status: Secondary | ICD-10-CM

## 2017-12-14 DIAGNOSIS — I429 Cardiomyopathy, unspecified: Secondary | ICD-10-CM | POA: Diagnosis present

## 2017-12-14 DIAGNOSIS — I5042 Chronic combined systolic (congestive) and diastolic (congestive) heart failure: Secondary | ICD-10-CM | POA: Diagnosis present

## 2017-12-14 DIAGNOSIS — I10 Essential (primary) hypertension: Secondary | ICD-10-CM | POA: Diagnosis not present

## 2017-12-14 DIAGNOSIS — Z4901 Encounter for fitting and adjustment of extracorporeal dialysis catheter: Secondary | ICD-10-CM | POA: Diagnosis not present

## 2017-12-14 DIAGNOSIS — Z91048 Other nonmedicinal substance allergy status: Secondary | ICD-10-CM

## 2017-12-14 DIAGNOSIS — R0602 Shortness of breath: Secondary | ICD-10-CM | POA: Diagnosis not present

## 2017-12-14 DIAGNOSIS — D689 Coagulation defect, unspecified: Secondary | ICD-10-CM | POA: Diagnosis not present

## 2017-12-14 DIAGNOSIS — Z8249 Family history of ischemic heart disease and other diseases of the circulatory system: Secondary | ICD-10-CM

## 2017-12-14 DIAGNOSIS — Z885 Allergy status to narcotic agent status: Secondary | ICD-10-CM

## 2017-12-14 LAB — I-STAT ARTERIAL BLOOD GAS, ED
ACID-BASE EXCESS: 7 mmol/L — AB (ref 0.0–2.0)
BICARBONATE: 32.6 mmol/L — AB (ref 20.0–28.0)
O2 Saturation: 99 %
TCO2: 34 mmol/L — AB (ref 22–32)
pCO2 arterial: 47.8 mmHg (ref 32.0–48.0)
pH, Arterial: 7.441 (ref 7.350–7.450)
pO2, Arterial: 132 mmHg — ABNORMAL HIGH (ref 83.0–108.0)

## 2017-12-14 LAB — CBC
HEMATOCRIT: 34.9 % — AB (ref 36.0–46.0)
Hemoglobin: 11 g/dL — ABNORMAL LOW (ref 12.0–15.0)
MCH: 32 pg (ref 26.0–34.0)
MCHC: 31.5 g/dL (ref 30.0–36.0)
MCV: 101.5 fL — ABNORMAL HIGH (ref 78.0–100.0)
Platelets: 228 10*3/uL (ref 150–400)
RBC: 3.44 MIL/uL — ABNORMAL LOW (ref 3.87–5.11)
RDW: 14.1 % (ref 11.5–15.5)
WBC: 7.2 10*3/uL (ref 4.0–10.5)

## 2017-12-14 LAB — HEPATIC FUNCTION PANEL
ALK PHOS: 72 U/L (ref 38–126)
ALT: 6 U/L (ref 0–44)
AST: 17 U/L (ref 15–41)
Albumin: 3 g/dL — ABNORMAL LOW (ref 3.5–5.0)
BILIRUBIN DIRECT: 0.1 mg/dL (ref 0.0–0.2)
BILIRUBIN INDIRECT: 0.6 mg/dL (ref 0.3–0.9)
BILIRUBIN TOTAL: 0.7 mg/dL (ref 0.3–1.2)
Total Protein: 6.9 g/dL (ref 6.5–8.1)

## 2017-12-14 LAB — I-STAT TROPONIN, ED: Troponin i, poc: 0.03 ng/mL (ref 0.00–0.08)

## 2017-12-14 LAB — BASIC METABOLIC PANEL
Anion gap: 12 (ref 5–15)
BUN: 8 mg/dL (ref 8–23)
CO2: 33 mmol/L — AB (ref 22–32)
Calcium: 8 mg/dL — ABNORMAL LOW (ref 8.9–10.3)
Chloride: 97 mmol/L — ABNORMAL LOW (ref 98–111)
Creatinine, Ser: 1.89 mg/dL — ABNORMAL HIGH (ref 0.44–1.00)
GFR calc Af Amer: 27 mL/min — ABNORMAL LOW (ref >60)
GFR, EST NON AFRICAN AMERICAN: 24 mL/min — AB (ref >60)
GLUCOSE: 95 mg/dL (ref 70–99)
POTASSIUM: 3.5 mmol/L (ref 3.5–5.1)
Sodium: 142 mmol/L (ref 135–145)

## 2017-12-14 LAB — PROTIME-INR
INR: 2.25
Prothrombin Time: 24.7 seconds — ABNORMAL HIGH (ref 11.4–15.2)

## 2017-12-14 LAB — APTT: APTT: 69 s — AB (ref 24–36)

## 2017-12-14 LAB — BRAIN NATRIURETIC PEPTIDE: B Natriuretic Peptide: 1971.8 pg/mL — ABNORMAL HIGH (ref 0.0–100.0)

## 2017-12-14 MED ORDER — VITAMIN C 500 MG PO TABS
500.0000 mg | ORAL_TABLET | Freq: Every day | ORAL | Status: DC
  Administered 2017-12-14 – 2017-12-15 (×2): 500 mg via ORAL
  Filled 2017-12-14 (×2): qty 1

## 2017-12-14 MED ORDER — ISOSORBIDE MONONITRATE ER 60 MG PO TB24
60.0000 mg | ORAL_TABLET | Freq: Every day | ORAL | Status: DC
  Administered 2017-12-15: 60 mg via ORAL
  Filled 2017-12-14 (×2): qty 1

## 2017-12-14 MED ORDER — CARVEDILOL 3.125 MG PO TABS
3.1250 mg | ORAL_TABLET | Freq: Two times a day (BID) | ORAL | Status: DC
  Administered 2017-12-14 – 2017-12-16 (×3): 3.125 mg via ORAL
  Filled 2017-12-14 (×3): qty 1

## 2017-12-14 MED ORDER — PRAVASTATIN SODIUM 40 MG PO TABS
40.0000 mg | ORAL_TABLET | Freq: Every day | ORAL | Status: DC
  Administered 2017-12-15: 40 mg via ORAL
  Filled 2017-12-14: qty 1

## 2017-12-14 MED ORDER — SODIUM CHLORIDE 0.9 % IV SOLN: 250.0000 mL | INTRAVENOUS | Status: DC | PRN

## 2017-12-14 MED ORDER — ONDANSETRON HCL 4 MG PO TABS: 2.0000 mg | ORAL_TABLET | Freq: Three times a day (TID) | ORAL | Status: DC | PRN

## 2017-12-14 MED ORDER — VITAMIN D (ERGOCALCIFEROL) 1.25 MG (50000 UNIT) PO CAPS
50000.0000 [IU] | ORAL_CAPSULE | ORAL | Status: DC
  Filled 2017-12-14: qty 1

## 2017-12-14 MED ORDER — SODIUM CHLORIDE 0.9% FLUSH: 3.0000 mL | INTRAVENOUS | Status: DC | PRN

## 2017-12-14 MED ORDER — ACETAMINOPHEN 500 MG PO TABS
1000.0000 mg | ORAL_TABLET | Freq: Once | ORAL | Status: AC
Start: 2017-12-14 — End: 2017-12-14
  Administered 2017-12-14: 1000 mg via ORAL
  Filled 2017-12-14: qty 2

## 2017-12-14 MED ORDER — HYDROCODONE-ACETAMINOPHEN 5-325 MG PO TABS
1.0000 | ORAL_TABLET | Freq: Four times a day (QID) | ORAL | Status: DC | PRN
  Filled 2017-12-14: qty 1

## 2017-12-14 MED ORDER — WARFARIN - PHARMACIST DOSING INPATIENT
Freq: Every day | Status: DC
  Administered 2017-12-15: 17:00:00

## 2017-12-14 MED ORDER — ACETAMINOPHEN 325 MG PO TABS
650.0000 mg | ORAL_TABLET | Freq: Four times a day (QID) | ORAL | Status: DC | PRN
  Administered 2017-12-15 – 2017-12-16 (×4): 650 mg via ORAL
  Filled 2017-12-14 (×4): qty 2

## 2017-12-14 MED ORDER — WARFARIN SODIUM 2.5 MG PO TABS
2.5000 mg | ORAL_TABLET | Freq: Once | ORAL | Status: AC
  Administered 2017-12-14: 2.5 mg via ORAL
  Filled 2017-12-14: qty 1

## 2017-12-14 MED ORDER — PROCHLORPERAZINE EDISYLATE 10 MG/2ML IJ SOLN: 10.0000 mg | Freq: Four times a day (QID) | INTRAMUSCULAR | Status: DC | PRN

## 2017-12-14 MED ORDER — POLYETHYLENE GLYCOL 3350 17 G PO PACK: 17.0000 g | PACK | Freq: Every day | ORAL | Status: DC | PRN

## 2017-12-14 MED ORDER — SEVELAMER CARBONATE 800 MG PO TABS
800.0000 mg | ORAL_TABLET | Freq: Three times a day (TID) | ORAL | Status: DC
  Administered 2017-12-15 – 2017-12-16 (×3): 800 mg via ORAL
  Filled 2017-12-14 (×4): qty 1

## 2017-12-14 MED ORDER — SODIUM CHLORIDE 0.9% FLUSH
3.0000 mL | Freq: Two times a day (BID) | INTRAVENOUS | Status: DC
  Administered 2017-12-14: 3 mL via INTRAVENOUS

## 2017-12-14 MED ORDER — ACETAMINOPHEN 650 MG RE SUPP: 650.0000 mg | Freq: Four times a day (QID) | RECTAL | Status: DC | PRN

## 2017-12-14 MED ORDER — AMIODARONE HCL 200 MG PO TABS
200.0000 mg | ORAL_TABLET | Freq: Every day | ORAL | Status: DC
  Administered 2017-12-15: 200 mg via ORAL
  Filled 2017-12-14 (×2): qty 1

## 2017-12-14 NOTE — ED Notes (Signed)
Dr.Kim at [facility]

## 2017-12-14 NOTE — ED Notes (Signed)
Patient transported to CT 

## 2017-12-14 NOTE — ED Notes (Signed)
Patient states she does not want an IV with contrast and is concerned for her kidney function. MD notified and states will come speak with family.

## 2017-12-14 NOTE — Progress Notes (Signed)
ANTICOAGULATION CONSULT NOTE - Initial Consult  Pharmacy Consult for warfarin Indication: atrial fibrillation  Allergies  Allergen Reactions  . Clonidine Derivatives Palpitations and Other (See Comments)    VERY SEDATED  . Adhesive [Tape] Other (See Comments)    Tears skin up  . Codeine Nausea Only  . Nickel Rash  . Sulfa Antibiotics Other (See Comments)    Reaction unknown    Patient Measurements: Height: 4\' 11"  (149.9 cm) Weight: 119 lb 0.8 oz (54 kg) IBW/kg (Calculated) : 43.2  Vital Signs: Temp: 99.2 F (37.3 C) (07/30 2038) Temp Source: Oral (07/30 2038) BP: 143/45 (07/30 2038) Pulse Rate: 75 (07/30 2038)  Labs: Recent Labs    12/13/17 1322 12/14/17 1735 12/14/17 1900  HGB  --  11.0*  --   HCT  --  34.9*  --   PLT  --  228  --   APTT  --   --  69*  LABPROT  --   --  24.7*  INR 1.9*  --  2.25  CREATININE  --  1.89*  --     Estimated Creatinine Clearance: 17.2 mL/min (A) (by C-G formula based on SCr of 1.89 mg/dL (H)).   Medical History: Past Medical History:  Diagnosis Date  . Acute kidney injury superimposed on CKD ([place]) 08/05/2016  . Acute on chronic respiratory failure with hypoxia ([place])   . Acute on chronic systolic congestive heart failure ([place])   . Acute pulmonary edema with congestive heart failure ([place]) 11/30/2016  . Acute renal failure superimposed on chronic kidney disease ([place]) 07/11/2016  . Acute respiratory failure ([place]) 02/13/2017  . Anemia of chronic disease 2018   + anemia of CRI?  [name] Atrial fibrillation ([place]) [I48.91] 12/11/2016  . Atrophy of left kidney    with absent blood flow by renal artery dopplers (Dr. Gwenlyn Found)  . 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 Chronic combined systolic and diastolic CHF (congestive heart failure) ([place])    a. 11/2016: echo showing EF of 35-40%, RV strain noted, mild MR and mild TR.   [name] Chronic combined systolic and diastolic heart failure ([place]) 01/05/2017  . Chronic hypoxemic respiratory failure  ([place])   . Chronic respiratory failure with hypoxia ([place]) 02/11/2017   Now on chronic O2  . CKD (chronic kidney disease), stage IV ([place]) 01/28/2016  . Debilitated patient    WC dependent as of 2018  . Dysrhythmia    afib  . Elevated troponin level 11/30/2016  . ESRD (end stage renal disease) ([place]) 02/13/2017  . ESRD on hemodialysis ([place]) 01/2017   T/Th/Sat schedule- [facility].  Right basilic AV fistula [id number] unable to successfully access this.  Dr. Donnetta Hutching to do an upper arm AV graft as of 10/2017..  . Family history of adverse reaction to anesthesia    Daughter- nausea  . History of adenomatous polyp of colon   . History of kidney stones    passed  . History of subarachnoid hemorrhage 10/2014   after syncope and while on xarelto  . HOH (hard of hearing)   . Hyperlipidemia   . Hypertension    Difficult to control, in the setting of one functioning kidney: pt was referred to nephrology by Dr. Gwenlyn Found 06/2015.  . Lumbar radiculopathy 2012  . Lumbar spinal stenosis 2019   facet injections only very short term relief.  As of 09/2017 ortho f/u, selective nerve root block planned.  . Lumbar spondylosis    MR 07/2016---no sign of spinal  nerve compression or cord compression.  Pt set up with outpt ortho while admitted to hosp 07/2016.  . Malnourished ([place])   . Metatarsal fracture 06/10/2016   Nondisplaced, left 5th metatarsal--pt was referred to ortho  . Nonischemic cardiomyopathy ([place])   . Osteopenia 2014   T-score -2.1  . PAF (paroxysmal atrial fibrillation) ([facility])    Eliquis started after BRAO and CVA.  She was changed to warfarin 2018--INR/coumadin mgmt per HD/nephrol.  . Palpitations 12/15/2016  . Personal history of subarachnoid hemorrhage 01/28/2016  . Presence of permanent cardiac pacemaker   . Pulmonary edema 11/2016  . 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 Right rib fracture 12/2016   s/p fall  . Sick sinus syndrome ([place])    Dual chamber pacer insertion  2018 (Dr. Lovena Le)  . Stroke [facility])    cardioembolic (had CVA while on no anticoag)--"scattered subacute punctate infarcts: 1 in R parietal lobe and 2 in occipital cortex" on MRI br.  CT angio head/neck: aortic arch athero.   . Tachycardia-bradycardia syndrome ([place]) - s/p PPM 12/07/2016   Pacer placed  . Thoracic back pain 01/2017   Facet?  Dr. Nelva Bush to do steroid injection as of 02/2017  . Thoracic compression fracture ([place]) 07/2016   T7 and T8-- T8 kyphoplasty during hosp admission 07/2016.  Neuro referred pt to pain mgmt for consideration of injection 09/2016.  I referred her to endo 08/2016 for consideration of calcitonin treatment.    Medications:  Medications Prior to Admission  Medication Sig Dispense Refill Last Dose  . acetaminophen (TYLENOL) 500 MG tablet Take 500-1,000 mg 3 (three) times daily as needed by mouth for moderate pain.    12/14/2017 at Unknown time  . amiodarone (PACERONE) 200 MG tablet Take 1 tablet (200 mg total) by mouth daily. KEEP OV. 90 tablet 0 12/14/2017 at Unknown time  . carvedilol (COREG) 3.125 MG tablet Take 1 tablet (3.125 mg total) by mouth 2 (two) times daily. 30 tablet 0 12/14/2017 at 6am  . HYDROcodone-acetaminophen (NORCO) 5-325 MG tablet Take 1 tablet by mouth every 6 (six) hours as needed for moderate pain or severe pain. 10 tablet 0 unk at prn  . isosorbide mononitrate (IMDUR) 60 MG 24 hr tablet TAKE 1 TABLET DAILY BY  MOUTH. 90 tablet 1 12/14/2017 at Unknown time  . ondansetron (ZOFRAN) 4 MG tablet Take 0.5-1 tablets (2-4 mg total) by mouth every 8 (eight) hours as needed for nausea or vomiting. 90 tablet 3 unk at prn  . polyethylene glycol (MIRALAX / GLYCOLAX) packet Take 17 g by mouth daily as needed for mild constipation.   unk at prn  . pravastatin (PRAVACHOL) 40 MG tablet TAKE 1 TABLET EVERY EVENING BY MOUTH. 90 tablet 0 12/13/2017 at Unknown time  . sevelamer carbonate (RENVELA) 800 MG tablet Take 800 mg by mouth See admin instructions. Two to three  times daily with meals   12/14/2017 at am  . vitamin C (ASCORBIC ACID) 500 MG tablet Take 500 mg by mouth daily.   12/14/2017 at Unknown time  . Vitamin D, Ergocalciferol, (DRISDOL) 50000 units CAPS capsule Take 1 capsule (50,000 Units total) by mouth every 7 (seven) days. (Patient taking differently: Take 50,000 Units by mouth every Thursday. ) 12 capsule 3 12/09/2017  . warfarin (COUMADIN) 3 MG tablet Take 1/2 to 1 tablets daily as directed by coumadin clinic (Patient taking differently: Take 1.5-3 mg by mouth See admin instructions. Take 3 mg by mouth daily on Monday and  Friday. Take 1.5 mg by mouth daily on all other days) 90 tablet 1 12/13/2017 at 8pm  . enoxaparin (LOVENOX) 60 MG/0.6ML injection Inject 0.6 mLs (60 mg total) into the skin daily. (Patient not taking: Reported on 12/14/2017) 10 Syringe 0 Completed Course at Unknown time    Assessment: Valerie Terrell  is a 84 y.o. female, with histpry of Afib on long term anticoagulation. Of note, pt is also ESRD on HD (T,Th,Sat). Most recent anti-coag visit was 7/29 with INR 1.9. Took an increased dose 7/29 of 4.5 mg x 1. Plan was for 3 mg today per clinic note. No bleeding noted currently. INR on admission 2.25. CBC low stable.  PTA warfarin dose: 3 mg on Monday and Friday, 1.5 mg all other days  Goal of Therapy:  INR 2-3 Monitor platelets by anticoagulation protocol: Yes   Plan:  Give warfarin 2.5 mg po x 1 Check baseline INR Monitor daily INR, CBC, clinical course, s/sx of bleed, PO intake, DDI   Thank you for allowing [place] to participate in this patients care.   Jens Som, PharmD Please utilize [facility] (under [facility]) for appropriate number for your unit pharmacist. 12/14/2017 9:04 PM

## 2017-12-14 NOTE — ED Provider Notes (Signed)
Patient placed in Quick Look pathway, seen and evaluated   Chief Complaint: SOB  HPI:   Pt states for the past 2 days she has had sob, worse when lying flat and with exertion. At dialysis today she was noted to have low O2, in the 80's. She was on O2 at home last year, has been off it for the last 6-7 months. She denies associated CP. She denies fevers chills, cough, abd pain, n/v, or abnormal BMs. She states she has abd fullness/tightness.  ROS: sob  Physical Exam:   Gen: No distress  Neuro: Awake and Alert  Skin: Warm    Focused Exam: decreased lung sounds in RLL. No wheezes, rales, or ronchi. Bilateral mild lower extremity edema.    Initiation of care has begun. The patient has been counseled on the process, plan, and necessity for staying for the completion/evaluation, and the remainder of the medical screening examination    Franchot Heidelberg, PA-C 12/14/17 1756    Hayden Rasmussen, MD 12/15/17 1204

## 2017-12-14 NOTE — ED Notes (Addendum)
Patient is from home with SOB; Pt states she walks at home on her on but at times need assistance from furniture; Patient is currently on 4L/M O2 but denies being on 02 at home; current O2 at 100; pt is on dialysis and had complete procedure done today with 2.2L drawn off; Pt states normal days of dialysis is T,TH, & Sat; Restricted right arm with recent incision right forarm with sutures; wound looks clean and dry with on signs of infection.; patient has port to right side of chest; Patient has pace maker to left chest; Patient is a&ox 4; Pt is on blood thinners per her report-Monique,RN

## 2017-12-14 NOTE — ED Triage Notes (Signed)
Pt states she has been feeling SOB since yesterday. Pt had dialysis today with no improvement. Reported room air sats 86%. Pt does not wear home O2. 97% with 3L [place]. Pt denies CP.

## 2017-12-14 NOTE — H&P (Addendum)
TRH H&P   Patient Demographics:    Valerie Terrell, is a 84 y.o. female  MRN: [id number]   DOB - 05-02-1933  Admit Date - 12/14/2017  Outpatient Primary MD for the patient is McGowen, Adrian Blackwater, MD  Referring MD/NP/PA:   Jola Schmidt  Outpatient Specialists: Hollie Salk (nephrology) Cristopher Peru (cardiology) Quay Burow (cardiology) Curt Jews (vascular ) Dr. Bridgett Larsson   Patient coming from:   home  Chief Complaint  Patient presents with  . Shortness of Breath      HPI:    [name]  is a 84 y.o. female, w ESRD on HD (T, T, S), Anemia, Hypertension, Pafib,  Sick Sinus/ Tachy-Brady s/p pacer, CHF (EF 35-40%), mild MR, moderate pulmonary hypertension, who presents with 2 days of dyspnea, slight orthopnea per pt.  Denies fever, chills, cough, cp, palp, n/v, diarrhea, brbpr, black stool.  Pt had dialysis today without incident.  Pt completed full dialysis but still felt dyspneic.  Pt notes that she has eaten out the past few days.  Pt admits to being intermittently on o2 at home.    In Ed,  Pox 85% on RA.  Her oropharynx is small ? OSA ABG was done on o2, not RA as requested.  PH 7.441, pCo2 47.8, pO2 132 on o2 [place]  Na 142, K 3.5, Bun 8, Creatinine 1.89 Wbc 7.2, hgb 11.0, Plt 228 Ast 17, Alt 6, Albumin 3.0 INR 2.25 Trop <0.03  CT chest IMPRESSION: Mild left lower lobe atelectasis. Negative for pneumonia or edema or effusion.  Chronic compression fracture T8. Moderate compression from fracture T3 which may be recent. Correlate with pain in this area  Right axillary hematoma related to recent surgery for dialysis AV fistula placement.  1 cm nodular density right lower lobe possibly a small vascular malformation.   Pt will be admitted for hypoxia and dyspnea      Review of systems:    In addition to the HPI above,  No Fever-chills, No Headache, No changes with  Vision or hearing, No problems swallowing food or Liquids,  No Abdominal pain, No Nausea or Vommitting, Bowel movements are regular, No Blood in stool or Urine, No dysuria, No new skin rashes or bruises, No new joints pains-aches,  No new weakness, tingling, numbness in any extremity, No recent weight gain or loss, No polyuria, polydypsia or polyphagia, No significant Mental Stressors.  A full 10 point Review of Systems was done, except as stated above, all other Review of Systems were negative.   With Past History of the following :    Past Medical History:  Diagnosis Date  . Acute kidney injury superimposed on CKD ([place]) 08/05/2016  . Acute on chronic respiratory failure with hypoxia ([place])   . Acute on chronic systolic congestive heart failure ([place])   . Acute pulmonary edema with congestive heart failure ([place]) 11/30/2016  . Acute renal  failure superimposed on chronic kidney disease ([place]) 07/11/2016  . Acute respiratory failure ([place]) 02/13/2017  . Anemia of chronic disease 2018   + anemia of CRI?  [name] Atrial fibrillation ([place]) [I48.91] 12/11/2016  . Atrophy of left kidney    with absent blood flow by renal artery dopplers (Dr. Gwenlyn Found)  . 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 Chronic combined systolic and diastolic CHF (congestive heart failure) ([place])    a. 11/2016: echo showing EF of 35-40%, RV strain noted, mild MR and mild TR.   [name] Chronic combined systolic and diastolic heart failure ([place]) 01/05/2017  . Chronic hypoxemic respiratory failure ([place])   . Chronic respiratory failure with hypoxia ([place]) 02/11/2017   Now on chronic O2  . CKD (chronic kidney disease), stage IV ([place]) 01/28/2016  . Debilitated patient    WC dependent as of 2018  . Dysrhythmia    afib  . Elevated troponin level 11/30/2016  . ESRD (end stage renal disease) ([place]) 02/13/2017  . ESRD on hemodialysis ([place]) 01/2017   T/Th/Sat schedule- [place].  Right basilic AV fistula [id number]/9811--BJY unable to  successfully access this.  Dr. Donnetta Hutching to do an upper arm AV graft as of 10/2017..  . Family history of adverse reaction to anesthesia    Daughter- nausea  . History of adenomatous polyp of colon   . History of kidney stones    passed  . History of subarachnoid hemorrhage 10/2014   after syncope and while on xarelto  . HOH (hard of hearing)   . Hyperlipidemia   . Hypertension    Difficult to control, in the setting of one functioning kidney: pt was referred to nephrology by Dr. Gwenlyn Found 06/2015.  . Lumbar radiculopathy 2012  . Lumbar spinal stenosis 2019   facet injections only very short term relief.  As of 09/2017 ortho f/u, selective nerve root block planned.  . Lumbar spondylosis    MR 07/2016---no sign of spinal nerve compression or cord compression.  Pt set up with outpt ortho while admitted to hosp 07/2016.  . Malnourished ([place])   . Metatarsal fracture 06/10/2016   Nondisplaced, left 5th metatarsal--pt was referred to ortho  . Nonischemic cardiomyopathy ([place])   . Osteopenia 2014   T-score -2.1  . PAF (paroxysmal atrial fibrillation) (HCC)    Eliquis started after BRAO and CVA.  She was changed to warfarin 2018--INR/coumadin mgmt per HD/nephrol.  . Palpitations 12/15/2016  . Personal history of subarachnoid hemorrhage 01/28/2016  . Presence of permanent cardiac pacemaker   . Pulmonary edema 11/2016  . 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 Right rib fracture 12/2016   s/p fall  . Sick sinus syndrome ([place])    Dual chamber pacer insertion 2018 (Dr. Lovena Le)  . Stroke [facility])    cardioembolic (had CVA while on no anticoag)--"scattered subacute punctate infarcts: 1 in R parietal lobe and 2 in occipital cortex" on MRI br.  CT angio head/neck: aortic arch athero.   . Tachycardia-bradycardia syndrome ([place]) - s/p PPM 12/07/2016   Pacer placed  . Thoracic back pain 01/2017   Facet?  Dr. Nelva Bush to do steroid injection as of 02/2017  . Thoracic compression fracture ([place]) 07/2016     T7 and T8-- T8 kyphoplasty during hosp admission 07/2016.  Neuro referred pt to pain mgmt for consideration of injection 09/2016.  I referred her to endo 08/2016 for consideration of calcitonin treatment.      Past Surgical History:  Procedure  Laterality Date  . APPENDECTOMY  child  . AV FISTULA PLACEMENT Right 03/31/2017   Procedure: Right ARM Basilic vein transposition;  Surgeon: Conrad , MD;  Location: [facility] OR;  Service: Vascular;  Laterality: Right;  . AV FISTULA PLACEMENT Right 12/06/2017   Procedure: INSERTION OF ARTERIOVENOUS (AV) GORE-TEX GRAFT ARM RIGHT UPPER EXTREMITY;  Surgeon: [name], MD;  Location: [place];  Service: Vascular;  Laterality: Right;  . CARDIOVASCULAR STRESS TEST  01/31/2016   Stress myoview: NORMAL/Low risk.  EF 56%.  . [place]  . COLONOSCOPY  2015   + hx of adenomatous polyps.  Need digest health spec in [place] records to see when pt due for next colonoscopy  . EYE SURGERY Bilateral    Catarct  . INSERTION OF DIALYSIS CATHETER Right 01/29/2017   Procedure: INSERTION OF DIALYSIS CATHETER- RIGHT INTERNAL JUGULAR;  Surgeon: [name], MD;  Location: [facility] OR;  Service: Vascular;  Laterality: Right;  . IR GENERIC HISTORICAL  07/24/2016   IR KYPHO THORACIC WITH BONE BIOPSY 07/24/2016 Luanne Bras, MD [facility]-INTERV RAD  . KYPHOPLASTY  07/27/2016   T8  . PACEMAKER IMPLANT N/A 12/03/2016   Procedure: Pacemaker Implant;  Surgeon: Evans Lance, MD;  Location: [place] CV LAB;  Service: Cardiovascular;  Laterality: N/A;  . Renal artery dopplers  02/26/2016   Her right renal dimension was 11 cm pole to pole with mild to moderate right renal artery stenosis. A right renal aortic ratio was 3.22 suggesting less than a 50% stenosis.  . TONSILLECTOMY    . TRANSTHORACIC ECHOCARDIOGRAM  01/29/2016; 11/2016   2017: EF 55-60%, normal LV wall motion, grade I DD.  No cardiac source of emboli was seen.  2018: EF 35-40%, could not assess DD due to a-fib, pulm  HTN noted.      Social History:     Social History   Tobacco Use  . Smoking status: Former Smoker    Years: 22.00    Last attempt to quit: 03/17/1972    Years since quitting: 45.7  . Smokeless tobacco: Never Used  Substance Use Topics  . Alcohol use: Yes    Alcohol/week: 0.6 oz    Types: 1 Glasses of wine per week    Comment: wine     Lives - at home  Mobility - walks by self   Family History :     Family History  Problem Relation Age of Onset  . Cancer Mother   . Heart disease Father   . Early death Father   . Sudden Cardiac Death Neg Hx       Home Medications:   Prior to Admission medications   Medication Sig Start Date End Date Taking? Authorizing Provider  acetaminophen (TYLENOL) 500 MG tablet Take 500-1,000 mg 3 (three) times daily as needed by mouth for moderate pain.    Yes [provider]  amiodarone (PACERONE) 200 MG tablet Take 1 tablet (200 mg total) by mouth daily. KEEP OV. 09/20/17  Yes Lorretta Harp, MD  carvedilol (COREG) 3.125 MG tablet Take 1 tablet (3.125 mg total) by mouth 2 (two) times daily. 07/21/17  Yes Lorretta Harp, MD  HYDROcodone-acetaminophen (NORCO) 5-325 MG tablet Take 1 tablet by mouth every 6 (six) hours as needed for moderate pain or severe pain. 12/06/17  Yes Rhyne, Hulen Shouts, PA-C  isosorbide mononitrate (IMDUR) 60 MG 24 hr tablet TAKE 1 TABLET DAILY BY  MOUTH. 08/18/17  Yes McGowen, Adrian Blackwater, MD  ondansetron (ZOFRAN) 4 MG tablet Take 0.5-1 tablets (2-4 mg total) by mouth every 8 (eight) hours as needed for nausea or vomiting. 12/10/16  Yes McGowen, Adrian Blackwater, MD  polyethylene glycol (MIRALAX / GLYCOLAX) packet Take 17 g by mouth daily as needed for mild constipation.   Yes [provider]  pravastatin (PRAVACHOL) 40 MG tablet TAKE 1 TABLET EVERY EVENING BY MOUTH. 11/15/17  Yes McGowen, Adrian Blackwater, MD  sevelamer carbonate (RENVELA) 800 MG tablet Take 800 mg by mouth See admin instructions. Two to three times daily with  meals   Yes [provider]  vitamin C (ASCORBIC ACID) 500 MG tablet Take 500 mg by mouth daily.   Yes [provider]  Vitamin D, Ergocalciferol, (DRISDOL) 50000 units CAPS capsule Take 1 capsule (50,000 Units total) by mouth every 7 (seven) days. Patient taking differently: Take 50,000 Units by mouth every Thursday.  05/03/17  Yes McGowen, Adrian Blackwater, MD  warfarin (COUMADIN) 3 MG tablet Take 1/2 to 1 tablets daily as directed by coumadin clinic Patient taking differently: Take 1.5-3 mg by mouth See admin instructions. Take 3 mg by mouth daily on Monday and Friday. Take 1.5 mg by mouth daily on all other days 04/07/17  Yes Lorretta Harp, MD  enoxaparin (LOVENOX) 60 MG/0.6ML injection Inject 0.6 mLs (60 mg total) into the skin daily. Patient not taking: Reported on 12/14/2017 11/16/17   Lorretta Harp, MD     Allergies:     Allergies  Allergen Reactions  . Clonidine Derivatives Palpitations and Other (See Comments)    VERY SEDATED  . Adhesive [Tape] Other (See Comments)    Tears skin up  . Codeine Nausea Only  . Nickel Rash  . Sulfa Antibiotics Other (See Comments)    Reaction unknown     Physical Exam:   Vitals  Blood pressure (!) 162/49, pulse 81, temperature 98.8 F (37.1 C), temperature source Oral, resp. rate 15, height 4\' 11"  (1.499 m), weight 119 lb 0.8 oz (54 kg), SpO2 92 %.   1. General  lying in bed in NAD,   2. Normal affect and insight, Not Suicidal or Homicidal, Awake Alert, Oriented X 3.  3. No F.N deficits, ALL C.Nerves Intact, Strength 5/5 all 4 extremities, Sensation intact all 4 extremities, Plantars down going.  4. Ears and Eyes appear Normal, Conjunctivae clear, PERRLA. Moist Oral Mucosa.  5. Supple Neck, No JVD, No cervical lymphadenopathy appriciated, No Carotid Bruits.  6. Symmetrical Chest wall movement, Good air movement bilaterally, CTAB.  7. RRR,s1, s2, slightly loud P2  8. Positive Bowel Sounds, Abdomen Soft, No  tenderness, No organomegaly appriciated,No rebound -guarding or rigidity.  9.  No Cyanosis, Normal Skin Turgor, No Skin Rash or Bruise.  10. Good muscle tone,  joints appear normal , no effusions, Normal ROM.  11. No Palpable Lymph Nodes in Neck or Axillae     Data Review:    CBC Recent Labs  Lab 12/14/17 1735  WBC 7.2  HGB 11.0*  HCT 34.9*  PLT 228  MCV 101.5*  MCH 32.0  MCHC 31.5  RDW 14.1   ------------------------------------------------------------------------------------------------------------------  Chemistries  Recent Labs  Lab 12/14/17 1735 12/14/17 1900  NA 142  --   K 3.5  --   CL 97*  --   CO2 33*  --   GLUCOSE 95  --   BUN 8  --   CREATININE 1.89*  --   CALCIUM 8.0*  --   AST  --  17  ALT  --  6  ALKPHOS  --  72  BILITOT  --  0.7   ------------------------------------------------------------------------------------------------------------------ estimated creatinine clearance is 17.2 mL/min (A) (by C-G formula based on SCr of 1.89 mg/dL (H)). ------------------------------------------------------------------------------------------------------------------ No results for input(s): TSH, T4TOTAL, T3FREE, THYROIDAB in the last 72 hours.  Invalid input(s): FREET3  Coagulation profile Recent Labs  Lab 12/10/17 1210 12/13/17 1322 12/14/17 1900  INR 1.5* 1.9* 2.25   ------------------------------------------------------------------------------------------------------------------- No results for input(s): DDIMER in the last 72 hours. -------------------------------------------------------------------------------------------------------------------  Cardiac Enzymes No results for input(s): CKMB, TROPONINI, MYOGLOBIN in the last 168 hours.  Invalid input(s): CK ------------------------------------------------------------------------------------------------------------------    Component Value Date/Time   BNP 1,035.7 (H) 02/13/2017 1357      ---------------------------------------------------------------------------------------------------------------  Urinalysis    Component Value Date/Time   COLORURINE YELLOW 01/20/2017 [place] 01/20/2017 1737   LABSPEC 1.012 01/20/2017 1737   PHURINE 5.0 01/20/2017 1737   GLUCOSEU NEGATIVE 01/20/2017 1737   HGBUR NEGATIVE 01/20/2017 1737   BILIRUBINUR NEGATIVE 01/20/2017 1737   BILIRUBINUR negative 09/24/2016 [place] 01/20/2017 1737   PROTEINUR 30 (A) 01/20/2017 1737   UROBILINOGEN 0.2 09/24/2016 1453   NITRITE NEGATIVE 01/20/2017 1737   LEUKOCYTESUR NEGATIVE 01/20/2017 1737    ----------------------------------------------------------------------------------------------------------------   Imaging Results:    Dg Chest 2 View  Result Date: 12/14/2017 CLINICAL DATA:  Short of breath EXAM: CHEST - 2 VIEW COMPARISON:  02/16/2017 FINDINGS: Cardiac enlargement without heart failure. Dual lead pacemaker unchanged. Right jugular central venous catheter tip in the right atrium unchanged. Lungs are clear without infiltrate or effusion. Vertebroplasty cement in the midthoracic spine vertebral body unchanged IMPRESSION: No active cardiopulmonary disease. Electronically Signed   By: Franchot Gallo M.D.   On: 12/14/2017 18:29   Ct Chest Wo Contrast  Result Date: 12/14/2017 CLINICAL DATA:  Short of breath. Right arm AV fistula placement 12/06/2017 EXAM: CT CHEST WITHOUT CONTRAST TECHNIQUE: Multidetector CT imaging of the chest was performed following the standard protocol without IV contrast. COMPARISON:  Chest two-view 12/14/2017 FINDINGS: Cardiovascular: Cardiac enlargement. Dual lead pacemaker. Extensive coronary calcification. Pulmonary artery enlargement bilaterally suggesting pulmonary artery hypertension. Atherosclerotic aorta without aneurysm. Limited evaluation without intravenous contrast. Mediastinum/Nodes: Negative for mass or adenopathy. Normal  esophagus. Lungs/Pleura: Mild atelectasis in the left lower lobe anteriorly. Negative for pneumonia or effusion. Negative for lung mass. Branching nodular density in the right lower lobe laterally axial image 74 measuring approximately 1 cm. This appears to represent a vascular structure given its branching although evaluation is limited without intravenous contrast. Possible small vascular malformation. No prior CT of this area. Upper Abdomen: Markedly atrophic left kidney. Atherosclerotic aorta. No acute abnormality. Musculoskeletal: Chronic compression fracture with vertebroplasty T8. Moderate compression fracture of T3 possibly recent. This was not present on thoracic MRI 07/23/2016 Hematoma in the right axilla related to prior graft placement. There is gas bubble within the hematoma related to recent surgery. Hematoma measures approximately 4 x 5 cm. IMPRESSION: Mild left lower lobe atelectasis. Negative for pneumonia or edema or effusion. Chronic compression fracture T8. Moderate compression from fracture T3 which may be recent. Correlate with pain in this area Right axillary hematoma related to recent surgery for dialysis AV fistula placement. 1 cm nodular density right lower lobe possibly a small vascular malformation. Aortic Atherosclerosis (ICD10-I70.0). Electronically Signed   By: Franchot Gallo M.D.   On: 12/14/2017 19:37     nsr at 75, nl axis, T inversion v1, v2   Assessment &  Plan:    Principal Problem:   Hypoxia Active Problems:   PAF (paroxysmal atrial fibrillation) (HCC)   Acute combined systolic and diastolic heart failure (HCC)    ESRD on dialysis (HCC)   Dyspnea    Hypoxia, Dyspnea ddx CHF secondary to ESRD, mod pulmonary hypertension, undiagnosed OSA Check BNP Check cardiac echo VQ scan r/o occult PE though doubtful since INR 2.25 but pt was off coumadin for her recent surgery Please consult nephrology in AM regarding need for possible extra dialysis PFT with lung volumes  and DLCO as outpatient  ESRD on HD (T, T, S) Cont vitamin D 50,000IU po qweek Cont Sevelamer 800mg  po tid ac meals   Acute on Chronic systolic, and diastolic CHF (EF 86-75%) Cont carvedilol 3.125mg  po bid Cont Imdur 60mg  po qday Please consult nephrology as above  Pafib Cont Amiodarone 200mg  po qday Coumadin pharmacy to dose  CVA Cont Pravastatin 40mg  po qhs  H/o Compression fracture T8, and more recently T3 F/u with pcp for bone density testing and tx of osteoporosis    DVT Prophylaxis  Coumadin pharmacy to dose  AM Labs Ordered, also please review Full Orders  Family Communication: Admission, patients condition and plan of care including tests being ordered have been discussed with the patient and daughter who indicate understanding and agree with the plan and Code Status.  Code Status  FULL CODE  Likely DC to  home  Condition GUARDED    Consults called:   Please call nephrology in AM  Admission status:  observation  Time spent in minutes : 60   Jani Gravel M.D on 12/14/2017 at 8:28 PM  Between 7am to 7pm - Pager - [phone]   After 7pm go to [email or web address] - password [facility]  [facility] - Office  [phone]

## 2017-12-14 NOTE — ED Notes (Signed)
Patient transported to X-ray 

## 2017-12-14 NOTE — ED Notes (Signed)
ED Provider at bedside. 

## 2017-12-15 ENCOUNTER — Observation Stay (HOSPITAL_BASED_OUTPATIENT_CLINIC_OR_DEPARTMENT_OTHER): Payer: Medicare Other

## 2017-12-15 ENCOUNTER — Observation Stay (HOSPITAL_COMMUNITY): Payer: Medicare Other

## 2017-12-15 DIAGNOSIS — H9190 Unspecified hearing loss, unspecified ear: Secondary | ICD-10-CM | POA: Diagnosis present

## 2017-12-15 DIAGNOSIS — J9621 Acute and chronic respiratory failure with hypoxia: Secondary | ICD-10-CM | POA: Diagnosis present

## 2017-12-15 DIAGNOSIS — D689 Coagulation defect, unspecified: Secondary | ICD-10-CM | POA: Diagnosis not present

## 2017-12-15 DIAGNOSIS — N2581 Secondary hyperparathyroidism of renal origin: Secondary | ICD-10-CM | POA: Diagnosis not present

## 2017-12-15 DIAGNOSIS — Z86010 Personal history of colonic polyps: Secondary | ICD-10-CM | POA: Diagnosis not present

## 2017-12-15 DIAGNOSIS — J9601 Acute respiratory failure with hypoxia: Secondary | ICD-10-CM | POA: Diagnosis not present

## 2017-12-15 DIAGNOSIS — E8889 Other specified metabolic disorders: Secondary | ICD-10-CM | POA: Diagnosis present

## 2017-12-15 DIAGNOSIS — I272 Pulmonary hypertension, unspecified: Secondary | ICD-10-CM | POA: Diagnosis present

## 2017-12-15 DIAGNOSIS — I10 Essential (primary) hypertension: Secondary | ICD-10-CM

## 2017-12-15 DIAGNOSIS — N186 End stage renal disease: Secondary | ICD-10-CM

## 2017-12-15 DIAGNOSIS — Z950 Presence of cardiac pacemaker: Secondary | ICD-10-CM | POA: Diagnosis not present

## 2017-12-15 DIAGNOSIS — I120 Hypertensive chronic kidney disease with stage 5 chronic kidney disease or end stage renal disease: Secondary | ICD-10-CM | POA: Diagnosis not present

## 2017-12-15 DIAGNOSIS — Z992 Dependence on renal dialysis: Secondary | ICD-10-CM

## 2017-12-15 DIAGNOSIS — R0600 Dyspnea, unspecified: Secondary | ICD-10-CM

## 2017-12-15 DIAGNOSIS — I5041 Acute combined systolic (congestive) and diastolic (congestive) heart failure: Secondary | ICD-10-CM | POA: Diagnosis present

## 2017-12-15 DIAGNOSIS — I495 Sick sinus syndrome: Secondary | ICD-10-CM | POA: Diagnosis present

## 2017-12-15 DIAGNOSIS — R0902 Hypoxemia: Secondary | ICD-10-CM | POA: Diagnosis not present

## 2017-12-15 DIAGNOSIS — E8770 Fluid overload, unspecified: Secondary | ICD-10-CM | POA: Diagnosis present

## 2017-12-15 DIAGNOSIS — D631 Anemia in chronic kidney disease: Secondary | ICD-10-CM | POA: Diagnosis not present

## 2017-12-15 DIAGNOSIS — I132 Hypertensive heart and chronic kidney disease with heart failure and with stage 5 chronic kidney disease, or end stage renal disease: Secondary | ICD-10-CM | POA: Diagnosis present

## 2017-12-15 DIAGNOSIS — I429 Cardiomyopathy, unspecified: Secondary | ICD-10-CM | POA: Diagnosis present

## 2017-12-15 DIAGNOSIS — I480 Paroxysmal atrial fibrillation: Secondary | ICD-10-CM

## 2017-12-15 DIAGNOSIS — M4854XA Collapsed vertebra, not elsewhere classified, thoracic region, initial encounter for fracture: Secondary | ICD-10-CM | POA: Diagnosis present

## 2017-12-15 DIAGNOSIS — Z8673 Personal history of transient ischemic attack (TIA), and cerebral infarction without residual deficits: Secondary | ICD-10-CM | POA: Diagnosis not present

## 2017-12-15 DIAGNOSIS — E785 Hyperlipidemia, unspecified: Secondary | ICD-10-CM | POA: Diagnosis present

## 2017-12-15 DIAGNOSIS — G4733 Obstructive sleep apnea (adult) (pediatric): Secondary | ICD-10-CM | POA: Diagnosis present

## 2017-12-15 DIAGNOSIS — Z9981 Dependence on supplemental oxygen: Secondary | ICD-10-CM | POA: Diagnosis not present

## 2017-12-15 DIAGNOSIS — R0602 Shortness of breath: Secondary | ICD-10-CM | POA: Diagnosis not present

## 2017-12-15 DIAGNOSIS — N261 Atrophy of kidney (terminal): Secondary | ICD-10-CM | POA: Diagnosis present

## 2017-12-15 DIAGNOSIS — Z4901 Encounter for fitting and adjustment of extracorporeal dialysis catheter: Secondary | ICD-10-CM | POA: Diagnosis not present

## 2017-12-15 DIAGNOSIS — M48061 Spinal stenosis, lumbar region without neurogenic claudication: Secondary | ICD-10-CM | POA: Diagnosis present

## 2017-12-15 DIAGNOSIS — I340 Nonrheumatic mitral (valve) insufficiency: Secondary | ICD-10-CM | POA: Diagnosis present

## 2017-12-15 DIAGNOSIS — Z23 Encounter for immunization: Secondary | ICD-10-CM | POA: Diagnosis not present

## 2017-12-15 DIAGNOSIS — E876 Hypokalemia: Secondary | ICD-10-CM | POA: Diagnosis not present

## 2017-12-15 LAB — TROPONIN I: Troponin I: 0.04 ng/mL (ref <0.03)

## 2017-12-15 LAB — MRSA PCR SCREENING: MRSA BY PCR: NEGATIVE

## 2017-12-15 LAB — CBC
HCT: 31.9 % — ABNORMAL LOW (ref 36.0–46.0)
Hemoglobin: 9.7 g/dL — ABNORMAL LOW (ref 12.0–15.0)
MCH: 31.3 pg (ref 26.0–34.0)
MCHC: 30.4 g/dL (ref 30.0–36.0)
MCV: 102.9 fL — ABNORMAL HIGH (ref 78.0–100.0)
Platelets: 196 10*3/uL (ref 150–400)
RBC: 3.1 MIL/uL — ABNORMAL LOW (ref 3.87–5.11)
RDW: 14.2 % (ref 11.5–15.5)
WBC: 7.6 10*3/uL (ref 4.0–10.5)

## 2017-12-15 LAB — COMPREHENSIVE METABOLIC PANEL
ALBUMIN: 2.6 g/dL — AB (ref 3.5–5.0)
ALT: 6 U/L (ref 0–44)
AST: 15 U/L (ref 15–41)
Alkaline Phosphatase: 60 U/L (ref 38–126)
Anion gap: 10 (ref 5–15)
BUN: 18 mg/dL (ref 8–23)
CO2: 31 mmol/L (ref 22–32)
CREATININE: 2.96 mg/dL — AB (ref 0.44–1.00)
Calcium: 8 mg/dL — ABNORMAL LOW (ref 8.9–10.3)
Chloride: 100 mmol/L (ref 98–111)
GFR calc Af Amer: 16 mL/min — ABNORMAL LOW (ref >60)
GFR calc non Af Amer: 14 mL/min — ABNORMAL LOW (ref >60)
Glucose, Bld: 88 mg/dL (ref 70–99)
Potassium: 3.6 mmol/L (ref 3.5–5.1)
Sodium: 141 mmol/L (ref 135–145)
Total Bilirubin: 0.5 mg/dL (ref 0.3–1.2)
Total Protein: 5.9 g/dL — ABNORMAL LOW (ref 6.5–8.1)

## 2017-12-15 LAB — ECHOCARDIOGRAM COMPLETE
Height: 59 in
Weight: 1915.36 oz

## 2017-12-15 LAB — TSH: TSH: 1.097 u[IU]/mL (ref 0.350–4.500)

## 2017-12-15 LAB — PROTIME-INR
INR: 2.66
Prothrombin Time: 28.1 seconds — ABNORMAL HIGH (ref 11.4–15.2)

## 2017-12-15 MED ORDER — TECHNETIUM TC 99M DIETHYLENETRIAME-PENTAACETIC ACID
30.0000 | Freq: Once | INTRAVENOUS | Status: AC | PRN
  Administered 2017-12-15: 30 via RESPIRATORY_TRACT

## 2017-12-15 MED ORDER — TECHNETIUM TO 99M ALBUMIN AGGREGATED
4.0000 | Freq: Once | INTRAVENOUS | Status: AC | PRN
  Administered 2017-12-15: 4 via INTRAVENOUS

## 2017-12-15 MED ORDER — ALTEPLASE 2 MG IJ SOLR: 2.0000 mg | Freq: Once | INTRAMUSCULAR | Status: DC | PRN

## 2017-12-15 MED ORDER — DARBEPOETIN ALFA 60 MCG/0.3ML IJ SOSY
60.0000 ug | PREFILLED_SYRINGE | INTRAMUSCULAR | Status: DC
  Administered 2017-12-16: 60 ug via INTRAVENOUS
  Filled 2017-12-15: qty 0.3

## 2017-12-15 MED ORDER — LIDOCAINE-PRILOCAINE 2.5-2.5 % EX CREA: 1.0000 "application " | TOPICAL_CREAM | CUTANEOUS | Status: DC | PRN

## 2017-12-15 MED ORDER — WARFARIN 0.5 MG HALF TABLET
1.5000 mg | ORAL_TABLET | Freq: Once | ORAL | Status: AC
  Administered 2017-12-15: 1.5 mg via ORAL
  Filled 2017-12-15: qty 1

## 2017-12-15 MED ORDER — HEPARIN SODIUM (PORCINE) 1000 UNIT/ML DIALYSIS
20.0000 [IU]/kg | INTRAMUSCULAR | Status: DC | PRN
  Administered 2017-12-15: 1100 [IU] via INTRAVENOUS_CENTRAL

## 2017-12-15 MED ORDER — SODIUM CHLORIDE 0.9 % IV SOLN: 100.0000 mL | INTRAVENOUS | Status: DC | PRN

## 2017-12-15 MED ORDER — PENTAFLUOROPROP-TETRAFLUOROETH EX AERO: 1.0000 "application " | INHALATION_SPRAY | CUTANEOUS | Status: DC | PRN

## 2017-12-15 MED ORDER — LIDOCAINE HCL (PF) 1 % IJ SOLN: 5.0000 mL | INTRAMUSCULAR | Status: DC | PRN

## 2017-12-15 MED ORDER — HEPARIN SODIUM (PORCINE) 1000 UNIT/ML DIALYSIS
1000.0000 [IU] | INTRAMUSCULAR | Status: DC | PRN
  Administered 2017-12-16: 1000 [IU] via INTRAVENOUS_CENTRAL

## 2017-12-15 MED ORDER — CHLORHEXIDINE GLUCONATE CLOTH 2 % EX PADS
6.0000 | MEDICATED_PAD | Freq: Every day | CUTANEOUS | Status: DC
  Administered 2017-12-16: 6 via TOPICAL

## 2017-12-15 MED ORDER — DIPHENHYDRAMINE HCL 50 MG/ML IJ SOLN: 25.0000 mg | Freq: Once | INTRAMUSCULAR | Status: DC

## 2017-12-15 NOTE — Progress Notes (Signed)
ANTICOAGULATION CONSULT NOTE - Follow-Up Consult  Pharmacy Consult for warfarin Indication: atrial fibrillation  Patient Measurements: Height: 4\' 11"  (149.9 cm) Weight: 119 lb 11.4 oz (54.3 kg) IBW/kg (Calculated) : 43.2  Vital Signs: Temp: 98 F (36.7 C) (07/31 0906) Temp Source: Oral (07/31 0906) BP: 149/45 (07/31 0906) Pulse Rate: 76 (07/31 0906)  Labs: Recent Labs    12/13/17 1322 12/14/17 1735 12/14/17 1900 12/15/17 0620  HGB  --  11.0*  --  9.7*  HCT  --  34.9*  --  31.9*  PLT  --  228  --  196  APTT  --   --  69*  --   LABPROT  --   --  24.7* 28.1*  INR 1.9*  --  2.25 2.66  CREATININE  --  1.89*  --  2.96*  TROPONINI  --   --   --  0.04*    Estimated Creatinine Clearance: 11 mL/min (A) (by C-G formula based on SCr of 2.96 mg/dL (H)).   Medications:  Medications Prior to Admission  Medication Sig Dispense Refill Last Dose  . acetaminophen (TYLENOL) 500 MG tablet Take 500-1,000 mg 3 (three) times daily as needed by mouth for moderate pain.    12/14/2017 at Unknown time  . amiodarone (PACERONE) 200 MG tablet Take 1 tablet (200 mg total) by mouth daily. KEEP OV. 90 tablet 0 12/14/2017 at Unknown time  . carvedilol (COREG) 3.125 MG tablet Take 1 tablet (3.125 mg total) by mouth 2 (two) times daily. 30 tablet 0 12/14/2017 at 6am  . HYDROcodone-acetaminophen (NORCO) 5-325 MG tablet Take 1 tablet by mouth every 6 (six) hours as needed for moderate pain or severe pain. 10 tablet 0 unk at prn  . isosorbide mononitrate (IMDUR) 60 MG 24 hr tablet TAKE 1 TABLET DAILY BY  MOUTH. 90 tablet 1 12/14/2017 at Unknown time  . ondansetron (ZOFRAN) 4 MG tablet Take 0.5-1 tablets (2-4 mg total) by mouth every 8 (eight) hours as needed for nausea or vomiting. 90 tablet 3 unk at prn  . polyethylene glycol (MIRALAX / GLYCOLAX) packet Take 17 g by mouth daily as needed for mild constipation.   unk at prn  . pravastatin (PRAVACHOL) 40 MG tablet TAKE 1 TABLET EVERY EVENING BY MOUTH. 90 tablet 0  12/13/2017 at Unknown time  . sevelamer carbonate (RENVELA) 800 MG tablet Take 800 mg by mouth See admin instructions. Two to three times daily with meals   12/14/2017 at am  . vitamin C (ASCORBIC ACID) 500 MG tablet Take 500 mg by mouth daily.   12/14/2017 at Unknown time  . Vitamin D, Ergocalciferol, (DRISDOL) 50000 units CAPS capsule Take 1 capsule (50,000 Units total) by mouth every 7 (seven) days. (Patient taking differently: Take 50,000 Units by mouth every Thursday. ) 12 capsule 3 12/09/2017  . warfarin (COUMADIN) 3 MG tablet Take 1/2 to 1 tablets daily as directed by coumadin clinic (Patient taking differently: Take 1.5-3 mg by mouth See admin instructions. Take 3 mg by mouth daily on Monday and Friday. Take 1.5 mg by mouth daily on all other days) 90 tablet 1 12/13/2017 at 8pm  . enoxaparin (LOVENOX) 60 MG/0.6ML injection Inject 0.6 mLs (60 mg total) into the skin daily. (Patient not taking: Reported on 12/14/2017) 10 Syringe 0 Completed Course at Unknown time    Assessment: 32 YOF on warfarin PTA for hx Afib.  84  Most recent anti-coag visit was 7/29 with INR 1.9. Took an increased dose 7/29 of 4.5  mg x 1. Admit INR 2.2  PTA warfarin dose: 3 mg on Monday and Friday, 1.5 mg all other days  INR today remains therapeutic (INR 2.66 << 2.25, goal of 2-3). Hgb/Hct slight drop, plts wnl  Goal of Therapy:  INR 2-3 Monitor platelets by anticoagulation protocol: Yes   Plan:  - Warfarin 1.5 mg x 1 dose at 1800 today - Will continue to monitor for any signs/symptoms of bleeding and will follow up with PT/INR in the a.m.   Thank you for allowing pharmacy to be a part of this patient's care.  Alycia Rossetti, PharmD, BCPS Clinical Pharmacist Pager: [phone] Clinical phone for 12/15/2017 from 7a-3:30p: [phone] If after 3:30p, please call main pharmacy at: [phone] Please check AMION for all [place] numbers 12/15/2017 2:23 PM

## 2017-12-15 NOTE — Progress Notes (Signed)
  Echocardiogram 2D Echocardiogram has been performed.  Darlina Sicilian M 12/15/2017, 2:21 PM

## 2017-12-15 NOTE — Consult Note (Signed)
Reason for Consult: To manage dialysis and dialysis related needs Referring Physician: Miamor Terrell is an 84 y.o. female with past medical history significant for congestive heart failure ejection fraction 35 to 40%, moderate pulmonary hypertension and mitral regurgitation as well as ESRD-HD q. TTS at [place].  Apparently, she reports subacute onset of worsening short of breath.  Chest x-ray is actually negative fluid.  She is noted to be above her dry weight but I am not sure of the accuracy of her dry weight.  She has strict regulations at the kidney center that she will only take to off at this time so fluid could be part of the issue.  She is hypoxic.  Her hemoglobin is 11.  She denies cramping but does have decreased blood pressure at times at the center   Dialyzes at [facility] TTS -4 hours  -EDW 52-but has been leaving out above 50 for the last few treatments. HD Bath 2/2, Dialyzer 180, Heparin yes 1400 q. treatment. Access TDC-does have right upper AV graft placed 7/22 .  Hectorol 1 mcg q. treatment  Past Medical History:  Diagnosis Date  . Acute kidney injury superimposed on CKD ([place]) 08/05/2016  . Acute on chronic respiratory failure with hypoxia ([place])   . Acute on chronic systolic congestive heart failure ([place])   . Acute pulmonary edema with congestive heart failure ([place]) 11/30/2016  . Acute renal failure superimposed on chronic kidney disease ([place]) 07/11/2016  . Acute respiratory failure ([place]) 02/13/2017  . Anemia of chronic disease 2018   + anemia of CRI?  [name] Atrial fibrillation ([place]) [I48.91] 12/11/2016  . Atrophy of left kidney    with absent blood flow by renal artery dopplers (Dr. Gwenlyn Terrell)  . 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 Chronic combined systolic and diastolic CHF (congestive heart failure) ([place])    a. 11/2016: echo showing EF of 35-40%, RV strain noted, mild MR and mild TR.   [name] Chronic combined systolic and diastolic heart failure ([place]) 01/05/2017   . Chronic hypoxemic respiratory failure ([place])   . Chronic respiratory failure with hypoxia ([place]) 02/11/2017   Now on chronic O2  . CKD (chronic kidney disease), stage IV ([place]) 01/28/2016  . Debilitated patient    WC dependent as of 2018  . Dysrhythmia    afib  . Elevated troponin level 11/30/2016  . ESRD (end stage renal disease) ([place]) 02/13/2017  . ESRD on hemodialysis ([place]) 01/2017   T/Th/Sat schedule- Valerie Terrell.  Right basilic AV fistula 05/7492--[facility] unable to successfully access this.  Dr. Donnetta Terrell to do an upper arm AV graft as of 10/2017..  . Family history of adverse reaction to anesthesia    Daughter- nausea  . History of adenomatous polyp of colon   . History of kidney stones    passed  . History of subarachnoid hemorrhage 10/2014   after syncope and while on xarelto  . HOH (hard of hearing)   . Hyperlipidemia   . Hypertension    Difficult to control, in the setting of one functioning kidney: pt was referred to nephrology by Dr. Gwenlyn Terrell 06/2015.  . Lumbar radiculopathy 2012  . Lumbar spinal stenosis 2019   facet injections only very short term relief.  As of 09/2017 ortho f/u, selective nerve root block planned.  . Lumbar spondylosis    MR 07/2016---no sign of spinal nerve compression or cord compression.  Pt set up with outpt ortho while admitted to hosp 07/2016.  . Malnourished (  [facility])   . Metatarsal fracture 06/10/2016   Nondisplaced, left 5th metatarsal--pt was referred to ortho  . Nonischemic cardiomyopathy ([place])   . Osteopenia 2014   T-score -2.1  . PAF (paroxysmal atrial fibrillation) (H[facility])    Eliquis started after BRAO and CVA.  She was changed to warfarin 2018--INR/coumadin mgmt per HD/nephrol.  . Palpitations 12/15/2016  . Personal history of subarachnoid hemorrhage 01/28/2016  . Presence of permanent cardiac pacemaker   . Pulmonary edema 11/2016  . 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 Right rib fracture 12/2016   s/p fall  . Sick sinus syndrome  ([place])    Dual chamber pacer insertion 2018 (Dr. Lovena Terrell)  . Stroke [facility])    cardioembolic (had CVA while on no anticoag)--"scattered subacute punctate infarcts: 1 in R parietal lobe and 2 in occipital cortex" on MRI br.  CT angio head/neck: aortic arch athero.   . Tachycardia-bradycardia syndrome ([place]) - s/p PPM 12/07/2016   Pacer placed  . Thoracic back pain 01/2017   Facet?  Dr. Nelva Terrell to do steroid injection as of 02/2017  . Thoracic compression fracture ([place]) 07/2016   T7 and T8-- T8 kyphoplasty during hosp admission 07/2016.  Neuro referred pt to pain mgmt for consideration of injection 09/2016.  I referred her to endo 08/2016 for consideration of calcitonin treatment.    Past Surgical History:  Procedure Laterality Date  . APPENDECTOMY  child  . AV FISTULA PLACEMENT Right 03/31/2017   Procedure: Right ARM Basilic vein transposition;  Surgeon: Valerie Terrell , MD;  Location: [facility] OR;  Service: Vascular;  Laterality: Right;  . AV FISTULA PLACEMENT Right 12/06/2017   Procedure: INSERTION OF ARTERIOVENOUS (AV) GORE-TEX GRAFT ARM RIGHT UPPER EXTREMITY;  Surgeon: Valerie Posner, MD;  Location: [place];  Service: Vascular;  Laterality: Right;  . CARDIOVASCULAR STRESS TEST  01/31/2016   Stress myoview: NORMAL/Low risk.  EF 56%.  . [place]  . COLONOSCOPY  2015   + hx of adenomatous polyps.  Need digest health spec in [place] records to see when pt due for next colonoscopy  . EYE SURGERY Bilateral    Catarct  . INSERTION OF DIALYSIS CATHETER Right 01/29/2017   Procedure: INSERTION OF DIALYSIS CATHETER- RIGHT INTERNAL JUGULAR;  Surgeon: Valerie Posner, MD;  Location: [facility] OR;  Service: Vascular;  Laterality: Right;  . IR GENERIC HISTORICAL  07/24/2016   IR KYPHO THORACIC WITH BONE BIOPSY 07/24/2016 Valerie Bras, MD [facility]-INTERV RAD  . KYPHOPLASTY  07/27/2016   T8  . PACEMAKER IMPLANT N/A 12/03/2016   Procedure: Pacemaker Implant;  Surgeon: Valerie Lance, MD;  Location: [facility];   Service: Cardiovascular;  Laterality: N/A;  . Renal artery dopplers  02/26/2016   Her right renal dimension was 11 cm pole to pole with mild to moderate right renal artery stenosis. A right renal aortic ratio was 3.22 suggesting less than a 50% stenosis.  . TONSILLECTOMY    . TRANSTHORACIC ECHOCARDIOGRAM  01/29/2016; 11/2016   2017: EF 55-60%, normal LV wall motion, grade I DD.  No cardiac source of emboli was seen.  2018: EF 35-40%, could not assess DD due to a-fib, pulm HTN noted.    Family History  Problem Relation Age of Onset  . Cancer Mother   . Heart disease Father   . Early death Father   . Sudden Cardiac Death Neg Hx     Social History:  reports that she quit smoking about 45  years ago. She quit after 22.00 years of use. She has never used smokeless tobacco. She reports that she drinks about 0.6 oz of alcohol per week. She reports that she does not use drugs.  Allergies:  Allergies  Allergen Reactions  . Clonidine Derivatives Palpitations and Other (See Comments)    VERY SEDATED  . Adhesive [Tape] Other (See Comments)    Tears skin up  . Codeine Nausea Only  . Nickel Rash  . Sulfa Antibiotics Other (See Comments)    Reaction unknown    Medications: I have reviewed the patient's current medications. Hectorol 1 mcg q. treatment   Results for orders placed or performed during the hospital encounter of 12/14/17 (from the past 48 hour(s))  Basic metabolic panel     Status: Abnormal   Collection Time: 12/14/17  5:35 PM  Result Value Ref Range   Sodium 142 135 - 145 mmol/L   Potassium 3.5 3.5 - 5.1 mmol/L   Chloride 97 (L) 98 - 111 mmol/L   CO2 33 (H) 22 - 32 mmol/L   Glucose, Bld 95 70 - 99 mg/dL   BUN 8 8 - 23 mg/dL   Creatinine, Ser 1.89 (H) 0.44 - 1.00 mg/dL   Calcium 8.0 (L) 8.9 - 10.3 mg/dL   GFR calc non Af Amer 24 (L) >60 mL/min   GFR calc Af Amer 27 (L) >60 mL/min    Comment: (NOTE) The eGFR has been calculated using the CKD EPI equation. This calculation  has not been validated in all clinical situations. eGFR's persistently <60 mL/min signify possible Chronic Kidney Disease.    Anion gap 12 5 - 15    Comment: Performed at [facility] [address] [place], [place] [address]  CBC     Status: Abnormal   Collection Time: 12/14/17  5:35 PM  Result Value Ref Range   WBC 7.2 4.0 - 10.5 K/uL   RBC 3.44 (L) 3.87 - 5.11 MIL/uL   Hemoglobin 11.0 (L) 12.0 - 15.0 g/dL   HCT 34.9 (L) 36.0 - 46.0 %   MCV 101.5 (H) 78.0 - 100.0 fL   MCH 32.0 26.0 - 34.0 pg   MCHC 31.5 30.0 - 36.0 g/dL   RDW 14.1 11.5 - 15.5 %   Platelets 228 150 - 400 K/uL    Comment: Performed at [facility], [place] [address], [place], [place] [address]  I-stat troponin, ED     Status: None   Collection Time: 12/14/17  5:43 PM  Result Value Ref Range   Troponin i, poc 0.03 0.00 - 0.08 ng/mL   Comment 3            Comment: Due to the release kinetics of cTnI, a negative result within the first hours of the onset of symptoms does not rule out myocardial infarction with certainty. If myocardial infarction is still suspected, repeat the test at appropriate intervals.   Hepatic function panel     Status: Abnormal   Collection Time: 12/14/17  7:00 PM  Result Value Ref Range   Total Protein 6.9 6.5 - 8.1 g/dL   Albumin 3.0 (L) 3.5 - 5.0 g/dL   AST 17 15 - 41 U/L   ALT 6 0 - 44 U/L   Alkaline Phosphatase 72 38 - 126 U/L   Total Bilirubin 0.7 0.3 - 1.2 mg/dL   Bilirubin, Direct 0.1 0.0 - 0.2 mg/dL   Indirect Bilirubin 0.6 0.3 - 0.9 mg/dL    Comment:  Performed at [facility], [place] [address], [place], [place] [address]  Protime-INR     Status: Abnormal   Collection Time: 12/14/17  7:00 PM  Result Value Ref Range   Prothrombin Time 24.7 (H) 11.4 - 15.2 seconds   INR 2.25     Comment: Performed at [facility] [address] [place], [place] [address]  APTT     Status: Abnormal   Collection Time: 12/14/17  7:00 PM  Result Value Ref Range   aPTT  69 (H) 24 - 36 seconds    Comment:        IF BASELINE aPTT IS ELEVATED, SUGGEST PATIENT RISK ASSESSMENT BE USED TO DETERMINE APPROPRIATE ANTICOAGULANT THERAPY. Performed at [facility], [place] [address] [place], [place] [address]   I-Stat Arterial Blood Gas, ED - (order at [facility] and [facility] only)     Status: Abnormal   Collection Time: 12/14/17  8:05 PM  Result Value Ref Range   pH, Arterial 7.441 7.350 - 7.450   pCO2 arterial 47.8 32.0 - 48.0 mmHg   pO2, Arterial 132.0 (H) 83.0 - 108.0 mmHg   Bicarbonate 32.6 (H) 20.0 - 28.0 mmol/L   TCO2 34 (H) 22 - 32 mmol/L   O2 Saturation 99.0 %   Acid-Base Excess 7.0 (H) 0.0 - 2.0 mmol/L   Patient temperature 98.8 F    Collection site ARTERIAL LINE    Drawn by RT    Sample type ARTERIAL   Brain natriuretic peptide     Status: Abnormal   Collection Time: 12/14/17  8:23 PM  Result Value Ref Range   B Natriuretic Peptide 1,971.8 (H) 0.0 - 100.0 pg/mL    Comment: Performed at [facility], [address], [place], [place] [address]  MRSA PCR Screening     Status: None   Collection Time: 12/14/17 10:55 PM  Result Value Ref Range   MRSA by PCR NEGATIVE NEGATIVE    Comment:        The GeneXpert MRSA Assay (FDA approved for NASAL specimens only), is one component of a comprehensive MRSA colonization surveillance program. It is not intended to diagnose MRSA infection nor to guide or monitor treatment for MRSA infections. Performed at [facility], [place] [address] [place],  [address]   Comprehensive metabolic panel     Status: Abnormal   Collection Time: 12/15/17  6:20 AM  Result Value Ref Range   Sodium 141 135 - 145 mmol/L   Potassium 3.6 3.5 - 5.1 mmol/L   Chloride 100 98 - 111 mmol/L   CO2 31 22 - 32 mmol/L   Glucose, Bld 88 70 - 99 mg/dL   BUN 18 8 - 23 mg/dL   Creatinine, Ser 2.96 (H) 0.44 - 1.00 mg/dL    Comment: DELTA CHECK NOTED   Calcium 8.0 (L) 8.9 - 10.3 mg/dL   Total Protein 5.9 (L) 6.5 - 8.1 g/dL    Albumin 2.6 (L) 3.5 - 5.0 g/dL   AST 15 15 - 41 U/L   ALT 6 0 - 44 U/L   Alkaline Phosphatase 60 38 - 126 U/L   Total Bilirubin 0.5 0.3 - 1.2 mg/dL   GFR calc non Af Amer 14 (L) >60 mL/min   GFR calc Af Amer 16 (L) >60 mL/min    Comment: (NOTE) The eGFR has been calculated using the CKD EPI equation. This calculation has not been validated in all clinical situations. eGFR's persistently <60  mL/min signify possible Chronic Kidney Disease.    Anion gap 10 5 - 15    Comment: Performed at [facility] [address], [place], [place] [address]  CBC     Status: Abnormal   Collection Time: 12/15/17  6:20 AM  Result Value Ref Range   WBC 7.6 4.0 - 10.5 K/uL   RBC 3.10 (L) 3.87 - 5.11 MIL/uL   Hemoglobin 9.7 (L) 12.0 - 15.0 g/dL   HCT 31.9 (L) 36.0 - 46.0 %   MCV 102.9 (H) 78.0 - 100.0 fL   MCH 31.3 26.0 - 34.0 pg   MCHC 30.4 30.0 - 36.0 g/dL   RDW 14.2 11.5 - 15.5 %   Platelets 196 150 - 400 K/uL    Comment: Performed at [facility], [place] [address] [place], [place] [address]  TSH     Status: None   Collection Time: 12/15/17  6:20 AM  Result Value Ref Range   TSH 1.097 0.350 - 4.500 uIU/mL    Comment: Performed by a 3rd Generation assay with a functional sensitivity of <=0.01 uIU/mL. Performed at [facility], [place] [address], [place], [place] [address]   Troponin I     Status: Abnormal   Collection Time: 12/15/17  6:20 AM  Result Value Ref Range   Troponin I 0.04 (HH) <0.03 ng/mL    Comment: CRITICAL RESULT CALLED TO, READ BACK BY AND VERIFIED WITH: L [place] RN [phone] 12/15/2017 BY A Th[place]ong Performed at [facility], [place] [address], [place], [place] [address]   Protime-INR     Status: Abnormal   Collection Time: 12/15/17  6:20 AM  Result Value Ref Range   Prothrombin Time 28.1 (H) 11.4 - 15.2 seconds   INR 2.66     Comment: Performed at [facility] [address], [place], [place] [address]    Dg Chest 2 View  Result Date:  12/14/2017 CLINICAL DATA:  Short of breath EXAM: CHEST - 2 VIEW COMPARISON:  02/16/2017 FINDINGS: Cardiac enlargement without heart failure. Dual lead pacemaker unchanged. Right jugular central venous catheter tip in the right atrium unchanged. Lungs are clear without infiltrate or effusion. Vertebroplasty cement in the midthoracic spine vertebral body unchanged IMPRESSION: No active cardiopulmonary disease. Electronically Signed   By: Franchot Gallo M.D.   On: 12/14/2017 18:29   Ct Chest Wo Contrast  Result Date: 12/14/2017 CLINICAL DATA:  Short of breath. Right arm AV fistula placement 12/06/2017 EXAM: CT CHEST WITHOUT CONTRAST TECHNIQUE: Multidetector CT imaging of the chest was performed following the standard protocol without IV contrast. COMPARISON:  Chest two-view 12/14/2017 FINDINGS: Cardiovascular: Cardiac enlargement. Dual lead pacemaker. Extensive coronary calcification. Pulmonary artery enlargement bilaterally suggesting pulmonary artery hypertension. Atherosclerotic aorta without aneurysm. Limited evaluation without intravenous contrast. Mediastinum/Nodes: Negative for mass or adenopathy. Normal esophagus. Lungs/Pleura: Mild atelectasis in the left lower lobe anteriorly. Negative for pneumonia or effusion. Negative for lung mass. Branching nodular density in the right lower lobe laterally axial image 74 measuring approximately 1 cm. This appears to represent a vascular structure given its branching although evaluation is limited without intravenous contrast. Possible small vascular malformation. No prior CT of this area. Upper Abdomen: Markedly atrophic left kidney. Atherosclerotic aorta. No acute abnormality. Musculoskeletal: Chronic compression fracture with vertebroplasty T8. Moderate compression fracture of T3 possibly recent. This was not present on thoracic MRI 07/23/2016 Hematoma in the right axilla related to prior graft placement. There is gas bubble within the  hematoma related to recent  surgery. Hematoma measures approximately 4 x 5 cm. IMPRESSION: Mild left lower lobe atelectasis. Negative for pneumonia or edema or effusion. Chronic compression fracture T8. Moderate compression from fracture T3 which may be recent. Correlate with pain in this area Right axillary hematoma related to recent surgery for dialysis AV fistula placement. 1 cm nodular density right lower lobe possibly a small vascular malformation. Aortic Atherosclerosis (ICD10-I70.0). Electronically Signed   By: Franchot Gallo M.D.   On: 12/14/2017 19:37   Nm Pulmonary Perf And Vent  Result Date: 12/15/2017 CLINICAL DATA:  84 year old female with shortness of breath. EXAM: NUCLEAR MEDICINE VENTILATION - PERFUSION LUNG SCAN TECHNIQUE: Ventilation images were obtained in multiple projections using inhaled aerosol Tc-21mDTPA. Perfusion images were obtained in multiple projections after intravenous injection of Tc-927mAA. RADIOPHARMACEUTICALS:  30.7 mCi of Tc-9972mPA aerosol inhalation and 4.1 mCi Tc99m2m IV COMPARISON:  Chest CT without contrast and chest radiographs 12/14/2017 FINDINGS: Chest CT yesterday remarkable for cardiomegaly and atelectasis at the left lung base. Ventilation: Patchy and confluent ventilation defects in the left lung, primarily the upper lung (when allowing for left lung base atelectasis seen yesterday). Mildly heterogeneous ventilation in the right lung. Perfusion: Homogeneous radiotracer activity, no perfusion defect identified. IMPRESSION: Nonspecific areas of ventilation, more so in the left lung, but no perfusion defect to suggest pulmonary embolus. Electronically Signed   By: H  HGenevie Ann.   On: 12/15/2017 08:47    ROS: Positive for dyspnea on exertion Blood pressure (!) 149/45, pulse 76, temperature 98 F (36.7 C), temperature source Oral, resp. rate 18, height 4' 11"  (1.499 m), weight 54.3 kg (119 lb 11.4 oz), SpO2 99 %. General appearance: alert, appears stated age and no distress Resp: clear  to auscultation bilaterally Cardio: regular rate and rhythm, S1, S2 normal, no murmur, click, rub or gallop GI: soft, non-tender; bowel sounds normal; no masses,  no organomegaly Extremities: extremities normal, atraumatic, no cyanosis or edema Right upper arm AV graft filled through clothing is patent.  Also has a right-sided tunneled dialysis catheter  Assessment/Plan: 84 y40r old white female with cardiopulmonary pathology and also ESRD.  She now presents with dyspnea on exertion and hypoxia which seems to have a subacute worsening 1 dyspnea on exertion-chest x-ray does not show overt edema.  BNP is elevated but difficult to interpret in the setting of ESRD.  Patient has been limiting her ultrafiltration with dialysis and has not been getting to dry weight.  The plan was to be serial dialysis but she got sent to the hospital secondary to hypoxia and oxygen requirement.  She agrees to an extra treatment today with regular treatment tomorrow as scheduled.  FYI, she runs a second shift tomorrow 8/1 so if discharge is planned she could possibly dialyze at her outpatient center tomorrow 2 ESRD: As above normally TTS.  Plan will be for extra 3-hour treatment today with volume removal as able 3 Hypertension: Although not significantly volume overloaded it may be enough to be giving her symptoms.  Plan is for extra dialysis as above.  Only on a tiny dose of Coreg 4. Anemia of ESRD: Hemoglobin 9.7-has not received ESA lately as last hemoglobin was greater than 11.  Will re-dose today 5. Metabolic Bone Disease: Will  be due Hectorol tomorrow.  Last PTH 231   Sherrin Stahle A 12/15/2017, 12:03 PM

## 2017-12-15 NOTE — Progress Notes (Signed)
PROGRESS NOTE        PATIENT DETAILS Name: Valerie Terrell Age: 84 y.o. Sex: female Date of Birth: 04-Mar-1933 Admit Date: 12/14/2017 Admitting Physician Jani Gravel, MD JIR:[id number], Adrian Blackwater, MD  Brief Narrative: Patient is a 84 y.o. female with history of chronic systolic heart failure, ESRD PAF on anticoagulation presenting with worsening shortness of breath-found to have acute hypoxic respiratory failure, likely secondary to volume overload.  See below for further details.  Subjective: 4 L of oxygen this morning-she does not have shortness of breath at rest-only on exertion.  Not in any distress.  Assessment/Plan: Acute hypoxic respiratory failure: On 4 L of oxygen this morning-her lungs are clear-chest x-ray does not show signs of pulmonary edema.  VQ scan negative for pulmonary embolism (recently had a graft placement-Coumadin held but patient was on Lovenox).  CT scan chest does not show any major parenchymal abnormalities.  Suspect that she may be volume overloaded-nephrology consulted-planning on HD today to see if volume removal will alleviate/improve hypoxia.  Apparently-approximately a year ago-she was on home O2.  Chronic combined systolic and diastolic heart failure: Although no obvious signs of volume overload on exam-given hypoxia-plans are for extra HD today to see if volume removal will alleviate hypoxia.  Echo on/31-shows EF is improved to 55-60%, with grade 2 diastolic dysfunction.    Mild to moderate mitral regurgitation: Stable for outpatient follow-follow-up.  Paroxysmal atrial fibrillation: Continue Coreg and amiodarone, anticoagulated with warfarin.  He is to be sinus rhythm on telemetry  Moderate pulmonary hypertension: Stable-previous PA pressure was 47-currently 52 on transthoracic echo.  Stable for outpatient follow-up.  Hypertension: Stable with Coreg and Imdur  Anemia: May be playing some role in her symptoms-this is clearly secondary  to ESRD-darbepoetin dose per nephrology.  Dyslipidemia: Continue statin  DVT Prophylaxis: Full dose anticoagulation with Coumadin  Code Status: Full code  Family Communication: Daughter at bedside  Disposition Plan: Remain inpatient-home in a few days  Antimicrobial agents: Anti-infectives (From admission, onward)   None      Procedures: None  CONSULTS:  nephrology  Time spent: 25- minutes-Greater than 50% of this time was spent in counseling, explanation of diagnosis, planning of further management, and coordination of care.  MEDICATIONS: Scheduled Meds: . amiodarone  200 mg Oral Daily  . carvedilol  3.125 mg Oral BID  . [START ON 12/16/2017] Chlorhexidine Gluconate Cloth  6 each Topical Q0600  . darbepoetin (ARANESP) injection - DIALYSIS  60 mcg Intravenous Q Wed-HD  . isosorbide mononitrate  60 mg Oral Daily  . pravastatin  40 mg Oral q1800  . sevelamer carbonate  800 mg Oral TID WC  . sodium chloride flush  3 mL Intravenous Q12H  . vitamin C  500 mg Oral Daily  . [START ON 12/16/2017] Vitamin D (Ergocalciferol)  50,000 Units Oral Q Thu  . warfarin  1.5 mg Oral ONCE-1800  . Warfarin - Pharmacist Dosing Inpatient   Does not apply q1800   Continuous Infusions: . sodium chloride     PRN Meds:.sodium chloride, acetaminophen **OR** acetaminophen, HYDROcodone-acetaminophen, ondansetron, polyethylene glycol, prochlorperazine, sodium chloride flush   PHYSICAL EXAM: Vital signs: Vitals:   12/14/17 2038 12/15/17 0334 12/15/17 0434 12/15/17 0906  BP: (!) 143/45 (!) 157/75  (!) 149/45  Pulse: 75 76  76  Resp: (!) 22 18  18   Temp: 99.2 F (37.3 C)  98.2 F (36.8 C)  98 F (36.7 C)  TempSrc: Oral Oral  Oral  SpO2: 100% 98%  99%  Weight: 54.3 kg (119 lb 11.4 oz)  54.3 kg (119 lb 11.4 oz)   Height:       Filed Weights   12/14/17 1729 12/14/17 2038 12/15/17 0434  Weight: 54 kg (119 lb 0.8 oz) 54.3 kg (119 lb 11.4 oz) 54.3 kg (119 lb 11.4 oz)   Body mass index is  24.18 kg/m.   General appearance :Awake, alert, not in any distress. Speech Clear.  Very frail appearing Eyes:Pink conjunctiva HEENT: Atraumatic and Normocephalic Neck: supple, no JVD. No cervical lymphadenopathy. No thyromegaly Resp:Good air entry bilaterally, no added sounds  CVS: S1 S2 regular, 3/6 systolic murmur.  GI: Bowel sounds present, Non tender and not distended with no gaurding, rigidity or rebound.No organomegaly Extremities: B/L Lower Ext shows no edema, both legs are warm to touch Neurology:  speech clear,Non focal, sensation is grossly intact. Psychiatric: Normal judgment and insight. Alert and oriented x 3. Normal mood. Musculoskeletal:No digital cyanosis Skin:No Rash, warm and dry Wounds:N/A  I have personally reviewed following labs and imaging studies  LABORATORY DATA: CBC: Recent Labs  Lab 12/14/17 1735 12/15/17 0620  WBC 7.2 7.6  HGB 11.0* 9.7*  HCT 34.9* 31.9*  MCV 101.5* 102.9*  PLT 228 409    Basic Metabolic Panel: Recent Labs  Lab 12/14/17 1735 12/15/17 0620  NA 142 141  K 3.5 3.6  CL 97* 100  CO2 33* 31  GLUCOSE 95 88  BUN 8 18  CREATININE 1.89* 2.96*  CALCIUM 8.0* 8.0*    GFR: Estimated Creatinine Clearance: 11 mL/min (A) (by C-G formula based on SCr of 2.96 mg/dL (H)).  Liver Function Tests: Recent Labs  Lab 12/14/17 1900 12/15/17 0620  AST 17 15  ALT 6 6  ALKPHOS 72 60  BILITOT 0.7 0.5  PROT 6.9 5.9*  ALBUMIN 3.0* 2.6*   No results for input(s): LIPASE, AMYLASE in the last 168 hours. No results for input(s): AMMONIA in the last 168 hours.  Coagulation Profile: Recent Labs  Lab 12/10/17 1210 12/13/17 1322 12/14/17 1900 12/15/17 0620  INR 1.5* 1.9* 2.25 2.66    Cardiac Enzymes: Recent Labs  Lab 12/15/17 0620  TROPONINI 0.04*    BNP (last 3 results) No results for input(s): PROBNP in the last 8760 hours.  HbA1C: No results for input(s): HGBA1C in the last 72 hours.  CBG: No results for input(s): GLUCAP  in the last 168 hours.  Lipid Profile: No results for input(s): CHOL, HDL, LDLCALC, TRIG, CHOLHDL, LDLDIRECT in the last 72 hours.  Thyroid Function Tests: Recent Labs    12/15/17 0620  TSH 1.097    Anemia Panel: No results for input(s): VITAMINB12, FOLATE, FERRITIN, TIBC, IRON, RETICCTPCT in the last 72 hours.  Urine analysis:    Component Value Date/Time   COLORURINE YELLOW 01/20/2017 [place] 01/20/2017 1737   LABSPEC 1.012 01/20/2017 1737   PHURINE 5.0 01/20/2017 1737   GLUCOSEU NEGATIVE 01/20/2017 1737   HGBUR NEGATIVE 01/20/2017 1737   BILIRUBINUR NEGATIVE 01/20/2017 1737   BILIRUBINUR negative 09/24/2016 1453   KETONESUR NEGATIVE 01/20/2017 1737   PROTEINUR 30 (A) 01/20/2017 1737   UROBILINOGEN 0.2 09/24/2016 1453   NITRITE NEGATIVE 01/20/2017 1737   LEUKOCYTESUR NEGATIVE 01/20/2017 1737    Sepsis Labs: Lactic Acid, Venous    Component Value Date/Time   LATICACIDVEN 0.67 07/11/2016 0905    MICROBIOLOGY: Recent Results (  from the past 240 hour(s))  MRSA PCR Screening     Status: None   Collection Time: 12/14/17 10:55 PM  Result Value Ref Range Status   MRSA by PCR NEGATIVE NEGATIVE Final    Comment:        The GeneXpert MRSA Assay (FDA approved for NASAL specimens only), is one component of a comprehensive MRSA colonization surveillance program. It is not intended to diagnose MRSA infection nor to guide or monitor treatment for MRSA infections. Performed at [facility], [place] [address], [place], [place] [address]     RADIOLOGY STUDIES/RESULTS: Dg Chest 2 View  Result Date: 12/14/2017 CLINICAL DATA:  Short of breath EXAM: CHEST - 2 VIEW COMPARISON:  02/16/2017 FINDINGS: Cardiac enlargement without heart failure. Dual lead pacemaker unchanged. Right jugular central venous catheter tip in the right atrium unchanged. Lungs are clear without infiltrate or effusion. Vertebroplasty cement in the midthoracic spine vertebral body  unchanged IMPRESSION: No active cardiopulmonary disease. Electronically Signed   By: Franchot Gallo M.D.   On: 12/14/2017 18:29   Ct Chest Wo Contrast  Result Date: 12/14/2017 CLINICAL DATA:  Short of breath. Right arm AV fistula placement 12/06/2017 EXAM: CT CHEST WITHOUT CONTRAST TECHNIQUE: Multidetector CT imaging of the chest was performed following the standard protocol without IV contrast. COMPARISON:  Chest two-view 12/14/2017 FINDINGS: Cardiovascular: Cardiac enlargement. Dual lead pacemaker. Extensive coronary calcification. Pulmonary artery enlargement bilaterally suggesting pulmonary artery hypertension. Atherosclerotic aorta without aneurysm. Limited evaluation without intravenous contrast. Mediastinum/Nodes: Negative for mass or adenopathy. Normal esophagus. Lungs/Pleura: Mild atelectasis in the left lower lobe anteriorly. Negative for pneumonia or effusion. Negative for lung mass. Branching nodular density in the right lower lobe laterally axial image 74 measuring approximately 1 cm. This appears to represent a vascular structure given its branching although evaluation is limited without intravenous contrast. Possible small vascular malformation. No prior CT of this area. Upper Abdomen: Markedly atrophic left kidney. Atherosclerotic aorta. No acute abnormality. Musculoskeletal: Chronic compression fracture with vertebroplasty T8. Moderate compression fracture of T3 possibly recent. This was not present on thoracic MRI 07/23/2016 Hematoma in the right axilla related to prior graft placement. There is gas bubble within the hematoma related to recent surgery. Hematoma measures approximately 4 x 5 cm. IMPRESSION: Mild left lower lobe atelectasis. Negative for pneumonia or edema or effusion. Chronic compression fracture T8. Moderate compression from fracture T3 which may be recent. Correlate with pain in this area Right axillary hematoma related to recent surgery for dialysis AV fistula placement. 1 cm  nodular density right lower lobe possibly a small vascular malformation. Aortic Atherosclerosis (ICD10-I70.0). Electronically Signed   By: Franchot Gallo M.D.   On: 12/14/2017 19:37   Nm Pulmonary Perf And Vent  Result Date: 12/15/2017 CLINICAL DATA:  84 year old female with shortness of breath. EXAM: NUCLEAR MEDICINE VENTILATION - PERFUSION LUNG SCAN TECHNIQUE: Ventilation images were obtained in multiple projections using inhaled aerosol Tc-69m DTPA. Perfusion images were obtained in multiple projections after intravenous injection of Tc-14m-MAA. RADIOPHARMACEUTICALS:  30.7 mCi of Tc-65m DTPA aerosol inhalation and 4.1 mCi Tc67m-MAA IV COMPARISON:  Chest CT without contrast and chest radiographs 12/14/2017 FINDINGS: Chest CT yesterday remarkable for cardiomegaly and atelectasis at the left lung base. Ventilation: Patchy and confluent ventilation defects in the left lung, primarily the upper lung (when allowing for left lung base atelectasis seen yesterday). Mildly heterogeneous ventilation in the right lung. Perfusion: Homogeneous radiotracer activity, no perfusion defect identified. IMPRESSION: Nonspecific areas of ventilation, more so in the left  lung, but no perfusion defect to suggest pulmonary embolus. Electronically Signed   By: Genevie Ann M.D.   On: 12/15/2017 08:47     LOS: 0 days   Oren Binet, MD  [facility]  If 7PM-7AM, please contact night-coverage  Please page via [email or web address] [email or web address]-click on MD name and type text message  12/15/2017, 4:27 PM

## 2017-12-15 NOTE — Progress Notes (Signed)
New Admission Note:  Arrival Method: Via stretcher from ED Mental Orientation: Alert & Oriented x4 Telemetry: CCMD verified Assessment: Completed Skin: Refer to flowsheet IV: Left AC Pain: 0/10 Tubes: Safety Measures: Safety Fall Prevention Plan discussed with patient. Admission: Completed [facility] [place] Orientation: Patient has been orientated to the room, unit and the staff. Family: Daughter at the bedside  Orders have been reviewed and are being implemented. Will continue to monitor the patient. Call light has been placed within reach and bed alarm has been activated.   Vassie Moselle, RN  Phone Number: [phone]

## 2017-12-15 NOTE — Progress Notes (Signed)
Social visit Patient had undergone right upper arm AV Gore-Tex graft placement by myself on 12/06/2017.  Admitted with shortness of breath.  Reports significant pain associated with her new upper arm graft although this is resolving.  Excellent thrill in her graft and mild bruising with nice healing of her antecubital and axillary incision.  Please call us if we can assist during this admission

## 2017-12-15 NOTE — Progress Notes (Signed)
Patient refused IV during the day. MD made aware. Will continue to monitor.  Farley Ly RN

## 2017-12-16 DIAGNOSIS — N2581 Secondary hyperparathyroidism of renal origin: Secondary | ICD-10-CM | POA: Diagnosis not present

## 2017-12-16 DIAGNOSIS — D689 Coagulation defect, unspecified: Secondary | ICD-10-CM | POA: Diagnosis not present

## 2017-12-16 DIAGNOSIS — Z23 Encounter for immunization: Secondary | ICD-10-CM | POA: Diagnosis not present

## 2017-12-16 DIAGNOSIS — N186 End stage renal disease: Secondary | ICD-10-CM | POA: Diagnosis not present

## 2017-12-16 DIAGNOSIS — Z4901 Encounter for fitting and adjustment of extracorporeal dialysis catheter: Secondary | ICD-10-CM | POA: Diagnosis not present

## 2017-12-16 DIAGNOSIS — E876 Hypokalemia: Secondary | ICD-10-CM | POA: Diagnosis not present

## 2017-12-16 DIAGNOSIS — D631 Anemia in chronic kidney disease: Secondary | ICD-10-CM | POA: Diagnosis not present

## 2017-12-16 LAB — PROTIME-INR
INR: 2.84
INR: 6.5 — AB (ref 0.9–1.1)
Prothrombin Time: 29.6 seconds — ABNORMAL HIGH (ref 11.4–15.2)

## 2017-12-16 MED ORDER — DARBEPOETIN ALFA 60 MCG/0.3ML IJ SOSY
PREFILLED_SYRINGE | INTRAMUSCULAR | Status: AC
  Administered 2017-12-16: 60 ug via INTRAVENOUS
  Filled 2017-12-16: qty 0.3

## 2017-12-16 MED ORDER — WARFARIN SODIUM 1 MG PO TABS: 1.5000 mg | ORAL_TABLET | Freq: Once | ORAL | Status: DC

## 2017-12-16 NOTE — Progress Notes (Signed)
HD tx ended 7 min early @ 0413 d/t system starting to clot off, UF goal met,blood rinsed back, VSS, report called to Tawni Carnes, RN

## 2017-12-16 NOTE — Discharge Summary (Signed)
PATIENT DETAILS Name: Valerie Terrell Age: 84 y.o. Sex: female Date of Birth: Sep 21, 1932 MRN: [id number]. Admitting Physician: Jani Gravel, MD ENI:[id number], Adrian Blackwater, MD  Admit Date: 12/14/2017 Discharge date: 12/16/2017  Recommendations for Outpatient Follow-up:  1. Follow up with PCP in 1-2 weeks 2. Please obtain BMP/CBC in one week  Admitted From:  Home  Disposition: [place]: No  Equipment/Devices: None  Discharge Condition: Stable  CODE STATUS: FULL CODE  Diet recommendation:  Heart Healthy  Brief Summary: See H&P, Labs, Consult and Test reports for all details in brief,Patient is a 84 y.o. female with history of chronic systolic heart failure, ESRD PAF on anticoagulation presenting with worsening shortness of breath-found to have acute hypoxic respiratory failure, likely secondary to volume overload.  See below for further details.  Brief Hospital Course: Acute hypoxic respiratory failure: Thought to be secondary to volume overload-ESRD and gets HD on TTS.  She recently underwent a AV graft placement, and apparently has been limiting her ultrafiltration with dialysis and not been getting to her dry weight.  On admission although chest x-ray did not show any pulmonary edema, she was requiring 4 L of oxygen to maintain O2 saturation above 90%.  VQ scan was negative for pulmonary embolism.  She was seen by nephrology-she will underwent extra HD yesterday, this morning she feels much better.  She is on room air, she ambulated in the hallway with physical therapy on room air with O2 saturation around 94%.  Since she has improved-she is stable to be discharged home.  ESRD: On HD TTS.  Resume usual HD schedule as an outpatient today-family claims they will take her to her outpatient HD clinic at 12 noon.  She may need her dry weight lowered a bit more-her to outpatient nephrology.  Chronic combined systolic and diastolic heart failure: Although no obvious signs of  volume overload on exam-given hypoxia-she was treated with extra HD yesterday-with significant improvement in hypoxia.  Echo on/31-shows EF is improved to 55-60%, with grade 2 diastolic dysfunction.    Mild to moderate mitral regurgitation: Stable for outpatient follow-follow-up.  Paroxysmal atrial fibrillation: Continue Coreg and amiodarone, anticoagulated with warfarin.   Moderate pulmonary hypertension: Stable-previous PA pressure was 47-currently 52 on transthoracic echo.  Stable for outpatient follow-up.  Hypertension: Stable with Coreg and Imdur  Anemia: May be playing some role in her symptoms-this is clearly secondary to ESRD-darbepoetin dose per nephrology.  Dyslipidemia: Continue statin  Procedures/Studies: None  Discharge Diagnoses:  Principal Problem:   Hypoxia Active Problems:   PAF (paroxysmal atrial fibrillation) (HCC)   Acute combined systolic and diastolic heart failure (HCC)    ESRD on dialysis [facility])   Dyspnea   Discharge Instructions:  Activity:  As tolerated with Full fall precautions use walker/cane & assistance as needed  Discharge Instructions    Diet - low sodium heart healthy   Complete by:  As directed    Discharge instructions   Complete by:  As directed    Follow with Primary MD  McGowen, Adrian Blackwater, MD in 1 week  Follow with your outpatient hemodialysis clinic at your usual schedule.  Please get a complete blood count and chemistry panel checked by your Primary MD at your next visit, and again as instructed by your Primary MD.  Get Medicines reviewed and adjusted: Please take all your medications with you for your next visit with your Primary MD  Laboratory/radiological data: Please request your Primary MD to go over all hospital tests  and procedure/radiological results at the follow up, please ask your Primary MD to get all Hospital records sent to his/her office.  In some cases, they will be blood work, cultures and biopsy results  pending at the time of your discharge. Please request that your primary care M.D. follows up on these results.  Also Note the following: If you experience worsening of your admission symptoms, develop shortness of breath, life threatening emergency, suicidal or homicidal thoughts you must seek medical attention immediately by calling 911 or calling your MD immediately  if symptoms less severe.  You must read complete instructions/literature along with all the possible adverse reactions/side effects for all the Medicines you take and that have been prescribed to you. Take any new Medicines after you have completely understood and accpet all the possible adverse reactions/side effects.   Do not drive when taking Pain medications or sleeping medications (Benzodaizepines)  Do not take more than prescribed Pain, Sleep and Anxiety Medications. It is not advisable to combine anxiety,sleep and pain medications without talking with your primary care practitioner  Special Instructions: If you have smoked or chewed Tobacco  in the last 2 yrs please stop smoking, stop any regular Alcohol  and or any Recreational drug use.  Wear Seat belts while driving.  Please note: You were cared for by a hospitalist during your hospital stay. Once you are discharged, your primary care physician will handle any further medical issues. Please note that NO REFILLS for any discharge medications will be authorized once you are discharged, as it is imperative that you return to your primary care physician (or establish a relationship with a primary care physician if you do not have one) for your post hospital discharge needs so that they can reassess your need for medications and monitor your lab values.   Increase activity slowly   Complete by:  As directed      Allergies as of 12/16/2017      Reactions   Clonidine Derivatives Palpitations, Other (See Comments)   VERY SEDATED   Adhesive [tape] Other (See Comments)   Tears  skin up   Codeine Nausea Only   Nickel Rash   Sulfa Antibiotics Other (See Comments)   Reaction unknown      Medication List    TAKE these medications   acetaminophen 500 MG tablet Commonly known as:  TYLENOL Take 500-1,000 mg 3 (three) times daily as needed by mouth for moderate pain.   amiodarone 200 MG tablet Commonly known as:  PACERONE Take 1 tablet (200 mg total) by mouth daily. KEEP OV.   carvedilol 3.125 MG tablet Commonly known as:  COREG Take 1 tablet (3.125 mg total) by mouth 2 (two) times daily.   enoxaparin 60 MG/0.6ML injection Commonly known as:  LOVENOX Inject 0.6 mLs (60 mg total) into the skin daily.   HYDROcodone-acetaminophen 5-325 MG tablet Commonly known as:  NORCO Take 1 tablet by mouth every 6 (six) hours as needed for moderate pain or severe pain.   isosorbide mononitrate 60 MG 24 hr tablet Commonly known as:  IMDUR TAKE 1 TABLET DAILY BY  MOUTH.   ondansetron 4 MG tablet Commonly known as:  ZOFRAN Take 0.5-1 tablets (2-4 mg total) by mouth every 8 (eight) hours as needed for nausea or vomiting.   polyethylene glycol packet Commonly known as:  MIRALAX / GLYCOLAX Take 17 g by mouth daily as needed for mild constipation.   pravastatin 40 MG tablet Commonly known as:  PRAVACHOL TAKE 1  TABLET EVERY EVENING BY MOUTH.   sevelamer carbonate 800 MG tablet Commonly known as:  RENVELA Take 800 mg by mouth See admin instructions. Two to three times daily with meals   vitamin C 500 MG tablet Commonly known as:  ASCORBIC ACID Take 500 mg by mouth daily.   Vitamin D (Ergocalciferol) 50000 units Caps capsule Commonly known as:  DRISDOL Take 1 capsule (50,000 Units total) by mouth every 7 (seven) days. What changed:  when to take this   warfarin 3 MG tablet Commonly known as:  COUMADIN Take as directed. If you are unsure how to take this medication, talk to your nurse or doctor. Original instructions:  Take 1/2 to 1 tablets daily as directed by  coumadin clinic What changed:    how much to take  how to take this  when to take this  additional instructions      Follow-up Information    McGowen, Adrian Blackwater, MD.   Specialty:  Family Medicine Contact information: [address] [place] [address] [phone]        Lorretta Harp, MD. Schedule an appointment as soon as possible for a visit in 2 week(s).   Specialties:  Cardiology, Radiology Contact information: [address] [place] [address] [phone]        Outpatient hemodialysis clinic Follow up.   Why:  Continue to follow at your usual schedule         Allergies  Allergen Reactions  . Clonidine Derivatives Palpitations and Other (See Comments)    VERY SEDATED  . Adhesive [Tape] Other (See Comments)    Tears skin up  . Codeine Nausea Only  . Nickel Rash  . Sulfa Antibiotics Other (See Comments)    Reaction unknown   Consultations:   nephrology  Other Procedures/Studies: Dg Chest 2 View  Result Date: 12/14/2017 CLINICAL DATA:  Short of breath EXAM: CHEST - 2 VIEW COMPARISON:  02/16/2017 FINDINGS: Cardiac enlargement without heart failure. Dual lead pacemaker unchanged. Right jugular central venous catheter tip in the right atrium unchanged. Lungs are clear without infiltrate or effusion. Vertebroplasty cement in the midthoracic spine vertebral body unchanged IMPRESSION: No active cardiopulmonary disease. Electronically Signed   By: Franchot Gallo M.D.   On: 12/14/2017 18:29   Ct Chest Wo Contrast  Result Date: 12/14/2017 CLINICAL DATA:  Short of breath. Right arm AV fistula placement 12/06/2017 EXAM: CT CHEST WITHOUT CONTRAST TECHNIQUE: Multidetector CT imaging of the chest was performed following the standard protocol without IV contrast. COMPARISON:  Chest two-view 12/14/2017 FINDINGS: Cardiovascular: Cardiac enlargement. Dual lead pacemaker. Extensive coronary calcification. Pulmonary artery enlargement  bilaterally suggesting pulmonary artery hypertension. Atherosclerotic aorta without aneurysm. Limited evaluation without intravenous contrast. Mediastinum/Nodes: Negative for mass or adenopathy. Normal esophagus. Lungs/Pleura: Mild atelectasis in the left lower lobe anteriorly. Negative for pneumonia or effusion. Negative for lung mass. Branching nodular density in the right lower lobe laterally axial image 74 measuring approximately 1 cm. This appears to represent a vascular structure given its branching although evaluation is limited without intravenous contrast. Possible small vascular malformation. No prior CT of this area. Upper Abdomen: Markedly atrophic left kidney. Atherosclerotic aorta. No acute abnormality. Musculoskeletal: Chronic compression fracture with vertebroplasty T8. Moderate compression fracture of T3 possibly recent. This was not present on thoracic MRI 07/23/2016 Hematoma in the right axilla related to prior graft placement. There is gas bubble within the hematoma related to recent surgery. Hematoma measures approximately 4 x 5 cm. IMPRESSION:  Mild left lower lobe atelectasis. Negative for pneumonia or edema or effusion. Chronic compression fracture T8. Moderate compression from fracture T3 which may be recent. Correlate with pain in this area Right axillary hematoma related to recent surgery for dialysis AV fistula placement. 1 cm nodular density right lower lobe possibly a small vascular malformation. Aortic Atherosclerosis (ICD10-I70.0). Electronically Signed   By: Franchot Gallo M.D.   On: 12/14/2017 19:37   Nm Pulmonary Perf And Vent  Result Date: 12/15/2017 CLINICAL DATA:  84 year old female with shortness of breath. EXAM: NUCLEAR MEDICINE VENTILATION - PERFUSION LUNG SCAN TECHNIQUE: Ventilation images were obtained in multiple projections using inhaled aerosol Tc-39m DTPA. Perfusion images were obtained in multiple projections after intravenous injection of Tc-35m-MAA.  RADIOPHARMACEUTICALS:  30.7 mCi of Tc-51m DTPA aerosol inhalation and 4.1 mCi Tc49m-MAA IV COMPARISON:  Chest CT without contrast and chest radiographs 12/14/2017 FINDINGS: Chest CT yesterday remarkable for cardiomegaly and atelectasis at the left lung base. Ventilation: Patchy and confluent ventilation defects in the left lung, primarily the upper lung (when allowing for left lung base atelectasis seen yesterday). Mildly heterogeneous ventilation in the right lung. Perfusion: Homogeneous radiotracer activity, no perfusion defect identified. IMPRESSION: Nonspecific areas of ventilation, more so in the left lung, but no perfusion defect to suggest pulmonary embolus. Electronically Signed   By: Genevie Ann M.D.   On: 12/15/2017 08:47      TODAY-DAY OF DISCHARGE:  Subjective:   Valerie Terrell today has no headache,no chest abdominal pain,no new weakness tingling or numbness, feels much better wants to go home today.   Objective:   Blood pressure (!) 131/50, pulse 74, temperature 99.2 F (37.3 C), temperature source Oral, resp. rate 18, height 4\' 11"  (1.499 m), weight 56.5 kg (124 lb 9 oz), SpO2 93 %.  Intake/Output Summary (Last 24 hours) at 12/16/2017 0943 Last data filed at 12/16/2017 0501 Gross per 24 hour  Intake 600 ml  Output 2763 ml  Net -2163 ml   Filed Weights   12/15/17 0434 12/15/17 2240 12/16/17 0150  Weight: 54.3 kg (119 lb 11.4 oz) 59 kg (130 lb 1.1 oz) 56.5 kg (124 lb 9 oz)    Exam: Awake Alert, Oriented *3, No new F.N deficits, Normal affect [place].AT,PERRAL Supple Neck,No JVD, No cervical lymphadenopathy appriciated.  Symmetrical Chest wall movement, Good air movement bilaterally, CTAB RRR,No Gallops,Rubs or new Murmurs, No Parasternal Heave +ve B.Sounds, Abd Soft, Non tender, No organomegaly appriciated, No rebound -guarding or rigidity. No Cyanosis, Clubbing or edema, No new Rash or bruise   PERTINENT RADIOLOGIC STUDIES: Dg Chest 2 View  Result Date: 12/14/2017 CLINICAL  DATA:  Short of breath EXAM: CHEST - 2 VIEW COMPARISON:  02/16/2017 FINDINGS: Cardiac enlargement without heart failure. Dual lead pacemaker unchanged. Right jugular central venous catheter tip in the right atrium unchanged. Lungs are clear without infiltrate or effusion. Vertebroplasty cement in the midthoracic spine vertebral body unchanged IMPRESSION: No active cardiopulmonary disease. Electronically Signed   By: Franchot Gallo M.D.   On: 12/14/2017 18:29   Ct Chest Wo Contrast  Result Date: 12/14/2017 CLINICAL DATA:  Short of breath. Right arm AV fistula placement 12/06/2017 EXAM: CT CHEST WITHOUT CONTRAST TECHNIQUE: Multidetector CT imaging of the chest was performed following the standard protocol without IV contrast. COMPARISON:  Chest two-view 12/14/2017 FINDINGS: Cardiovascular: Cardiac enlargement. Dual lead pacemaker. Extensive coronary calcification. Pulmonary artery enlargement bilaterally suggesting pulmonary artery hypertension. Atherosclerotic aorta without aneurysm. Limited evaluation without intravenous contrast. Mediastinum/Nodes: Negative for mass or adenopathy. Normal esophagus.  Lungs/Pleura: Mild atelectasis in the left lower lobe anteriorly. Negative for pneumonia or effusion. Negative for lung mass. Branching nodular density in the right lower lobe laterally axial image 74 measuring approximately 1 cm. This appears to represent a vascular structure given its branching although evaluation is limited without intravenous contrast. Possible small vascular malformation. No prior CT of this area. Upper Abdomen: Markedly atrophic left kidney. Atherosclerotic aorta. No acute abnormality. Musculoskeletal: Chronic compression fracture with vertebroplasty T8. Moderate compression fracture of T3 possibly recent. This was not present on thoracic MRI 07/23/2016 Hematoma in the right axilla related to prior graft placement. There is gas bubble within the hematoma related to recent surgery. Hematoma  measures approximately 4 x 5 cm. IMPRESSION: Mild left lower lobe atelectasis. Negative for pneumonia or edema or effusion. Chronic compression fracture T8. Moderate compression from fracture T3 which may be recent. Correlate with pain in this area Right axillary hematoma related to recent surgery for dialysis AV fistula placement. 1 cm nodular density right lower lobe possibly a small vascular malformation. Aortic Atherosclerosis (ICD10-I70.0). Electronically Signed   By: Franchot Gallo M.D.   On: 12/14/2017 19:37   Nm Pulmonary Perf And Vent  Result Date: 12/15/2017 CLINICAL DATA:  84 year old female with shortness of breath. EXAM: NUCLEAR MEDICINE VENTILATION - PERFUSION LUNG SCAN TECHNIQUE: Ventilation images were obtained in multiple projections using inhaled aerosol Tc-59m DTPA. Perfusion images were obtained in multiple projections after intravenous injection of Tc-22m-MAA. RADIOPHARMACEUTICALS:  30.7 mCi of Tc-88m DTPA aerosol inhalation and 4.1 mCi Tc19m-MAA IV COMPARISON:  Chest CT without contrast and chest radiographs 12/14/2017 FINDINGS: Chest CT yesterday remarkable for cardiomegaly and atelectasis at the left lung base. Ventilation: Patchy and confluent ventilation defects in the left lung, primarily the upper lung (when allowing for left lung base atelectasis seen yesterday). Mildly heterogeneous ventilation in the right lung. Perfusion: Homogeneous radiotracer activity, no perfusion defect identified. IMPRESSION: Nonspecific areas of ventilation, more so in the left lung, but no perfusion defect to suggest pulmonary embolus. Electronically Signed   By: Genevie Ann M.D.   On: 12/15/2017 08:47     PERTINENT LAB RESULTS: CBC: Recent Labs    12/14/17 1735 12/15/17 0620  WBC 7.2 7.6  HGB 11.0* 9.7*  HCT 34.9* 31.9*  PLT 228 196   CMET CMP     Component Value Date/Time   NA 141 12/15/2017 0620   NA 137 09/01/2016   K 3.6 12/15/2017 0620   CL 100 12/15/2017 0620   CO2 31 12/15/2017  0620   GLUCOSE 88 12/15/2017 0620   BUN 18 12/15/2017 0620   BUN 51 (A) 09/01/2016   CREATININE 2.96 (H) 12/15/2017 0620   CREATININE 1.63 (H) 03/24/2016 0110   CALCIUM 8.0 (L) 12/15/2017 0620   CALCIUM 9.5 02/16/2017 0232   PROT 5.9 (L) 12/15/2017 0620   PROT 6.1 06/17/2016 0819   ALBUMIN 2.6 (L) 12/15/2017 0620   ALBUMIN 3.7 06/17/2016 0819   AST 15 12/15/2017 0620   ALT 6 12/15/2017 0620   ALKPHOS 60 12/15/2017 0620   BILITOT 0.5 12/15/2017 0620   BILITOT 0.2 06/17/2016 0819   GFRNONAA 14 (L) 12/15/2017 0620   GFRAA 16 (L) 12/15/2017 0620    GFR Estimated Creatinine Clearance: 11.2 mL/min (A) (by C-G formula based on SCr of 2.96 mg/dL (H)). No results for input(s): LIPASE, AMYLASE in the last 72 hours. Recent Labs    12/15/17 0620  TROPONINI 0.04*   Invalid input(s): POCBNP No results for input(s): DDIMER in the  last 72 hours. No results for input(s): HGBA1C in the last 72 hours. No results for input(s): CHOL, HDL, LDLCALC, TRIG, CHOLHDL, LDLDIRECT in the last 72 hours. Recent Labs    12/15/17 0620  TSH 1.097   No results for input(s): VITAMINB12, FOLATE, FERRITIN, TIBC, IRON, RETICCTPCT in the last 72 hours. Coags: Recent Labs    12/15/17 0620 12/16/17 0304  INR 2.66 2.84   Microbiology: Recent Results (from the past 240 hour(s))  MRSA PCR Screening     Status: None   Collection Time: 12/14/17 10:55 PM  Result Value Ref Range Status   MRSA by PCR NEGATIVE NEGATIVE Final    Comment:        The GeneXpert MRSA Assay (FDA approved for NASAL specimens only), is one component of a comprehensive MRSA colonization surveillance program. It is not intended to diagnose MRSA infection nor to guide or monitor treatment for MRSA infections. Performed at [facility], [place] [address] [place], [place] [address]     FURTHER DISCHARGE INSTRUCTIONS:  Get Medicines reviewed and adjusted: Please take all your medications with you for your next visit with your  Primary MD  Laboratory/radiological data: Please request your Primary MD to go over all hospital tests and procedure/radiological results at the follow up, please ask your Primary MD to get all Hospital records sent to his/her office.  In some cases, they will be blood work, cultures and biopsy results pending at the time of your discharge. Please request that your primary care M.D. goes through all the records of your hospital data and follows up on these results.  Also Note the following: If you experience worsening of your admission symptoms, develop shortness of breath, life threatening emergency, suicidal or homicidal thoughts you must seek medical attention immediately by calling 911 or calling your MD immediately  if symptoms less severe.  You must read complete instructions/literature along with all the possible adverse reactions/side effects for all the Medicines you take and that have been prescribed to you. Take any new Medicines after you have completely understood and accpet all the possible adverse reactions/side effects.   Do not drive when taking Pain medications or sleeping medications (Benzodaizepines)  Do not take more than prescribed Pain, Sleep and Anxiety Medications. It is not advisable to combine anxiety,sleep and pain medications without talking with your primary care practitioner  Special Instructions: If you have smoked or chewed Tobacco  in the last 2 yrs please stop smoking, stop any regular Alcohol  and or any Recreational drug use.  Wear Seat belts while driving.  Please note: You were cared for by a hospitalist during your hospital stay. Once you are discharged, your primary care physician will handle any further medical issues. Please note that NO REFILLS for any discharge medications will be authorized once you are discharged, as it is imperative that you return to your primary care physician (or establish a relationship with a primary care physician if you do  not have one) for your post hospital discharge needs so that they can reassess your need for medications and monitor your lab values.  Total Time spent coordinating discharge including counseling, education and face to face time equals  45 minutes.  SignedOren Binet 12/16/2017 9:43 AM

## 2017-12-16 NOTE — Progress Notes (Signed)
Pt discharge instructions given, pt and family verbalized understanding.  VSS. Denies pain.  Denies shortness of breath.  Pt leaving floor private vehicle home to dialysis appt.

## 2017-12-16 NOTE — Discharge Instructions (Signed)

## 2017-12-16 NOTE — Progress Notes (Signed)
HD tx initiated via HD cath w/o problem, bilat ports: pull/push/flush equally w/o problem, VSS, will cont to monitor while on HD tx

## 2017-12-16 NOTE — Progress Notes (Addendum)
Physical Therapy Treatment Patient Details Name: Valerie Terrell MRN: [id number] DOB: 05-24-1932 Today's Date: 12/16/2017    History of Present Illness Pt is a 84 y.o. F with ESRD on HD, anemia, hypertension, Pafib, Sick Sinus/Tachy-Brady s/p pacer, CHF (EF 35-40%), mild MR, moderate pulmonary hypertension, who presents with 2 days of dyspnea, mild left lower lobe atelectasis. Negative for pneumonia or edema. CT shows chronic compression fracture T8, moderate compression from fracture T3 which may be recent.     PT Comments    Patient appears to be close to baseline with functional mobility. Prior to admission, patient is mainly a household ambulator and is independent with ADL's. On PT evaluation, pt ambulating 30 feet with no assistive device with supervision; no significant imbalance noted. Received on 3L O2, SpO2 94% on RA with mobility, replaced O2 when returned to bed. Patient's main deficits include abnormal posture and back pain. Instructed patient on scapular retractions for postural re-education. Discussed follow up therapy options with patient; patient was previously active with HHPT and does not feel that she would benefit from further services. Pt has all necessary home equipment. Will follow acutely to progress mobility.    Follow Up Recommendations  No PT follow up     Equipment Recommendations  None recommended by PT    Recommendations for Other Services       Precautions / Restrictions Precautions Precautions: Fall;Back Precaution Comments: back for comfort Restrictions Weight Bearing Restrictions: No    Mobility  Bed Mobility Overal bed mobility: Modified Independent             General bed mobility comments: received sitting on EOB. Increased time to progess from sit to supine  Transfers Overall transfer level: Independent Equipment used: None                Ambulation/Gait Ambulation/Gait assistance: Supervision Gait Distance (Feet): 30  Feet Assistive device: None Gait Pattern/deviations: Step-through pattern;Decreased stride length;Trunk flexed Gait velocity: decr   General Gait Details: Patient with slow and steady gait with no AD. Decreased arm swing noted with right arm held in internal rotation.    Stairs             Wheelchair Mobility    Modified Rankin (Stroke Patients Only)       Balance Overall balance assessment: Mild deficits observed, not formally tested                                          Cognition Arousal/Alertness: Awake/alert Behavior During Therapy: WFL for tasks assessed/performed Overall Cognitive Status: Within Functional Limits for tasks assessed                                        Exercises Other Exercises Other Exercises: Seated scapular retractions x 10 (3 second holds)    General Comments General comments (skin integrity, edema, etc.): Increased tightness noted in thoracic erector spinae       Pertinent Vitals/Pain Pain Assessment: Faces Faces Pain Scale: Hurts even more Pain Location: Mid thoracic region Pain Descriptors / Indicators: Discomfort;Constant;Grimacing Pain Intervention(s): Monitored during session    Home Living Family/patient expects to be discharged to:: Private residence Living Arrangements: Children(daughter) Available Help at Discharge: Family Type of Home: House Home Access: Ramped entrance   [place]: One level  Home Equipment: Bedside commode;Walker - 2 wheels;Cane - single point;Walker - 4 wheels      Prior Function Level of Independence: Independent with assistive device(s)      Comments: Ambulates inside with no assistive device and uses a cane for outdoor ambulation   PT Goals (current goals can now be found in the care plan section) Acute Rehab PT Goals Patient Stated Goal: decrease back pain PT Goal Formulation: With patient Time For Goal Achievement: 12/30/17 Potential to Achieve  Goals: Good    Frequency    Min 3X/week      PT Plan      Co-evaluation              AM-PAC PT "6 Clicks" Daily Activity  Outcome Measure  Difficulty turning over in bed (including adjusting bedclothes, sheets and blankets)?: None Difficulty moving from lying on back to sitting on the side of the bed? : None Difficulty sitting down on and standing up from a chair with arms (e.g., wheelchair, bedside commode, etc,.)?: None Help needed moving to and from a bed to chair (including a wheelchair)?: None Help needed walking in hospital room?: A Little Help needed climbing 3-5 steps with a railing? : A Little 6 Click Score: 22    End of Session   Activity Tolerance: Patient tolerated treatment well Patient left: in bed;with call bell/phone within reach;Other (comment)(with hot pack applied to back)   PT Visit Diagnosis: Unsteadiness on feet (R26.81);Difficulty in walking, not elsewhere classified (R26.2);Pain Pain - part of body: (back)     Time: 8416-6063 PT Time Calculation (min) (ACUTE ONLY): 21 min  Charges:                        Ellamae Sia, PT, DPT Acute Rehabilitation Services  Pager: [phone]    Willy Eddy 12/16/2017, 8:53 AM

## 2017-12-16 NOTE — Progress Notes (Signed)
ANTICOAGULATION CONSULT NOTE - Follow-Up Consult  Pharmacy Consult for warfarin Indication: atrial fibrillation  Patient Measurements: Height: 4\' 11"  (149.9 cm) Weight: 124 lb 9 oz (56.5 kg) IBW/kg (Calculated) : 43.2  Vital Signs: Temp: 99.2 F (37.3 C) (08/01 0932) Temp Source: Oral (08/01 0932) BP: 131/50 (08/01 0932) Pulse Rate: 74 (08/01 0932)  Labs: Recent Labs    12/14/17 1735 12/14/17 1900 12/15/17 0620 12/16/17 0304  HGB 11.0*  --  9.7*  --   HCT 34.9*  --  31.9*  --   PLT 228  --  196  --   APTT  --  69*  --   --   LABPROT  --  24.7* 28.1* 29.6*  INR  --  2.25 2.66 2.84  CREATININE 1.89*  --  2.96*  --   TROPONINI  --   --  0.04*  --     Estimated Creatinine Clearance: 11.2 mL/min (A) (by C-G formula based on SCr of 2.96 mg/dL (H)).   Medications:  Medications Prior to Admission  Medication Sig Dispense Refill Last Dose  . acetaminophen (TYLENOL) 500 MG tablet Take 500-1,000 mg 3 (three) times daily as needed by mouth for moderate pain.    12/14/2017 at Unknown time  . amiodarone (PACERONE) 200 MG tablet Take 1 tablet (200 mg total) by mouth daily. KEEP OV. 90 tablet 0 12/14/2017 at Unknown time  . carvedilol (COREG) 3.125 MG tablet Take 1 tablet (3.125 mg total) by mouth 2 (two) times daily. 30 tablet 0 12/14/2017 at 6am  . HYDROcodone-acetaminophen (NORCO) 5-325 MG tablet Take 1 tablet by mouth every 6 (six) hours as needed for moderate pain or severe pain. 10 tablet 0 unk at prn  . isosorbide mononitrate (IMDUR) 60 MG 24 hr tablet TAKE 1 TABLET DAILY BY  MOUTH. 90 tablet 1 12/14/2017 at Unknown time  . ondansetron (ZOFRAN) 4 MG tablet Take 0.5-1 tablets (2-4 mg total) by mouth every 8 (eight) hours as needed for nausea or vomiting. 90 tablet 3 unk at prn  . polyethylene glycol (MIRALAX / GLYCOLAX) packet Take 17 g by mouth daily as needed for mild constipation.   unk at prn  . pravastatin (PRAVACHOL) 40 MG tablet TAKE 1 TABLET EVERY EVENING BY MOUTH. 90 tablet  0 12/13/2017 at Unknown time  . sevelamer carbonate (RENVELA) 800 MG tablet Take 800 mg by mouth See admin instructions. Two to three times daily with meals   12/14/2017 at am  . vitamin C (ASCORBIC ACID) 500 MG tablet Take 500 mg by mouth daily.   12/14/2017 at Unknown time  . Vitamin D, Ergocalciferol, (DRISDOL) 50000 units CAPS capsule Take 1 capsule (50,000 Units total) by mouth every 7 (seven) days. (Patient taking differently: Take 50,000 Units by mouth every Thursday. ) 12 capsule 3 12/09/2017  . warfarin (COUMADIN) 3 MG tablet Take 1/2 to 1 tablets daily as directed by coumadin clinic (Patient taking differently: Take 1.5-3 mg by mouth See admin instructions. Take 3 mg by mouth daily on Monday and Friday. Take 1.5 mg by mouth daily on all other days) 90 tablet 1 12/13/2017 at 8pm  . enoxaparin (LOVENOX) 60 MG/0.6ML injection Inject 0.6 mLs (60 mg total) into the skin daily. (Patient not taking: Reported on 12/14/2017) 10 Syringe 0 Completed Course at Unknown time    Assessment: 14 YOF on warfarin PTA for hx Afib.  84  Most recent anti-coag visit was 7/29 with INR 1.9. Took an increased dose 7/29 of 4.5 mg x  1. Admit INR 2.2  PTA warfarin dose: 3 mg on Monday and Friday, 1.5 mg all other days  INR today remains therapeutic (INR 2.84 << 2.66, goal of 2-3). Hgb/Hct slight drop, plts wnl  Noted plans for discharge today - the patient can resume on their PTA dosing.   Goal of Therapy:  INR 2-3 Monitor platelets by anticoagulation protocol: Yes   Plan:  - Warfarin 1.5 mg x 1 dose at 1800 today (if still here) - Will continue to monitor for any signs/symptoms of bleeding and will follow up with PT/INR in the a.m. (if still here)  Thank you for allowing pharmacy to be a part of this patient's care.  Alycia Rossetti, PharmD, BCPS Clinical Pharmacist Pager: [phone] Clinical phone for 12/16/2017 from 7a-3:30p: [phone] If after 3:30p, please call main pharmacy at: [phone] Please check AMION for  all [facility] numbers 12/16/2017 10:38 AM

## 2017-12-16 NOTE — Progress Notes (Signed)
Doing much better after extra HD yesterday. Reviewed PT note from this morning-patient ambulated on room air-O2 saturations were around 94%.  Currently she is on room air and is feeling much better.  She wants to be discharged home, she will go to her outpatient HD clinic to resume her usual dialysis this afternoon.  Spoke with nephrology-Dr. Moshe Cipro was also agreeable with this plan.  See discharge summary for further details.

## 2017-12-16 NOTE — Progress Notes (Addendum)
[facility] Progress Note   Subjective:   Feeling better, breathing improved. Just tired. Wanting to d/c to do HD at OP unit. Feels much better- no longer hypoxic- still supposedly over EDW   Objective Vitals:   12/16/17 0203 12/16/17 0204 12/16/17 0501 12/16/17 0932  BP: (!) 190/72  (!) 171/68 (!) 131/50  Pulse: 84 84 75 74  Resp: (!) 22  18 18   Temp: 98.3 F (36.8 C)  98.5 F (36.9 C) 99.2 F (37.3 C)  TempSrc: Oral   Oral  SpO2: 95% 97% 94% 93%  Weight:      Height:       Physical Exam General:NAD, frail, elderly female Heart:RRR Lungs:CTAB Extremities:no LE edema Dialysis Access: RU AVF, R IJ [facility]   Filed Weights   12/15/17 0434 12/15/17 2240 12/16/17 0150  Weight: 54.3 kg (119 lb 11.4 oz) 59 kg (130 lb 1.1 oz) 56.5 kg (124 lb 9 oz)    Intake/Output Summary (Last 24 hours) at 12/16/2017 0949 Last data filed at 12/16/2017 0501 Gross per 24 hour  Intake 600 ml  Output 2763 ml  Net -2163 ml    Additional Objective Labs: Basic Metabolic Panel: Recent Labs  Lab 12/14/17 1735 12/15/17 0620  NA 142 141  K 3.5 3.6  CL 97* 100  CO2 33* 31  GLUCOSE 95 88  BUN 8 18  CREATININE 1.89* 2.96*  CALCIUM 8.0* 8.0*   Liver Function Tests: Recent Labs  Lab 12/14/17 1900 12/15/17 0620  AST 17 15  ALT 6 6  ALKPHOS 72 60  BILITOT 0.7 0.5  PROT 6.9 5.9*  ALBUMIN 3.0* 2.6*   CBC: Recent Labs  Lab 12/14/17 1735 12/15/17 0620  WBC 7.2 7.6  HGB 11.0* 9.7*  HCT 34.9* 31.9*  MCV 101.5* 102.9*  PLT 228 196   Blood Culture    Component Value Date/Time   SDES BLOOD LEFT FOREARM 01/13/2017 1256   SPECREQUEST IN PEDIATRIC BOTTLE Blood Culture adequate volume 01/13/2017 1256   CULT NO GROWTH 5 DAYS 01/13/2017 1256   REPTSTATUS 01/18/2017 FINAL 01/13/2017 1256    Cardiac Enzymes: Recent Labs  Lab 12/15/17 0620  TROPONINI 0.04*    Lab Results  Component Value Date   INR 2.84 12/16/2017   INR 2.66 12/15/2017   INR 2.25 12/14/2017    Studies/Results: Dg Chest 2 View  Result Date: 12/14/2017 CLINICAL DATA:  Short of breath EXAM: CHEST - 2 VIEW COMPARISON:  02/16/2017 FINDINGS: Cardiac enlargement without heart failure. Dual lead pacemaker unchanged. Right jugular central venous catheter tip in the right atrium unchanged. Lungs are clear without infiltrate or effusion. Vertebroplasty cement in the midthoracic spine vertebral body unchanged IMPRESSION: No active cardiopulmonary disease. Electronically Signed   By: Franchot Gallo M.D.   On: 12/14/2017 18:29   Ct Chest Wo Contrast  Result Date: 12/14/2017 CLINICAL DATA:  Short of breath. Right arm AV fistula placement 12/06/2017 EXAM: CT CHEST WITHOUT CONTRAST TECHNIQUE: Multidetector CT imaging of the chest was performed following the standard protocol without IV contrast. COMPARISON:  Chest two-view 12/14/2017 FINDINGS: Cardiovascular: Cardiac enlargement. Dual lead pacemaker. Extensive coronary calcification. Pulmonary artery enlargement bilaterally suggesting pulmonary artery hypertension. Atherosclerotic aorta without aneurysm. Limited evaluation without intravenous contrast. Mediastinum/Nodes: Negative for mass or adenopathy. Normal esophagus. Lungs/Pleura: Mild atelectasis in the left lower lobe anteriorly. Negative for pneumonia or effusion. Negative for lung mass. Branching nodular density in the right lower lobe laterally axial image 74 measuring approximately 1 cm. This appears to represent a  vascular structure given its branching although evaluation is limited without intravenous contrast. Possible small vascular malformation. No prior CT of this area. Upper Abdomen: Markedly atrophic left kidney. Atherosclerotic aorta. No acute abnormality. Musculoskeletal: Chronic compression fracture with vertebroplasty T8. Moderate compression fracture of T3 possibly recent. This was not present on thoracic MRI 07/23/2016 Hematoma in the right axilla related to prior graft placement. There  is gas bubble within the hematoma related to recent surgery. Hematoma measures approximately 4 x 5 cm. IMPRESSION: Mild left lower lobe atelectasis. Negative for pneumonia or edema or effusion. Chronic compression fracture T8. Moderate compression from fracture T3 which may be recent. Correlate with pain in this area Right axillary hematoma related to recent surgery for dialysis AV fistula placement. 1 cm nodular density right lower lobe possibly a small vascular malformation. Aortic Atherosclerosis (ICD10-I70.0). Electronically Signed   By: Franchot Gallo M.D.   On: 12/14/2017 19:37   Nm Pulmonary Perf And Vent  Result Date: 12/15/2017 CLINICAL DATA:  84 year old female with shortness of breath. EXAM: NUCLEAR MEDICINE VENTILATION - PERFUSION LUNG SCAN TECHNIQUE: Ventilation images were obtained in multiple projections using inhaled aerosol Tc-37m DTPA. Perfusion images were obtained in multiple projections after intravenous injection of Tc-64m-MAA. RADIOPHARMACEUTICALS:  30.7 mCi of Tc-58m DTPA aerosol inhalation and 4.1 mCi Tc40m-MAA IV COMPARISON:  Chest CT without contrast and chest radiographs 12/14/2017 FINDINGS: Chest CT yesterday remarkable for cardiomegaly and atelectasis at the left lung base. Ventilation: Patchy and confluent ventilation defects in the left lung, primarily the upper lung (when allowing for left lung base atelectasis seen yesterday). Mildly heterogeneous ventilation in the right lung. Perfusion: Homogeneous radiotracer activity, no perfusion defect identified. IMPRESSION: Nonspecific areas of ventilation, more so in the left lung, but no perfusion defect to suggest pulmonary embolus. Electronically Signed   By: Genevie Ann M.D.   On: 12/15/2017 08:47    Medications: . sodium chloride    . sodium chloride    . sodium chloride     . amiodarone  200 mg Oral Daily  . carvedilol  3.125 mg Oral BID  . Chlorhexidine Gluconate Cloth  6 each Topical Q0600  . darbepoetin (ARANESP)  injection - DIALYSIS  60 mcg Intravenous Q Wed-HD  . isosorbide mononitrate  60 mg Oral Daily  . pravastatin  40 mg Oral q1800  . sevelamer carbonate  800 mg Oral TID WC  . sodium chloride flush  3 mL Intravenous Q12H  . vitamin C  500 mg Oral Daily  . Vitamin D (Ergocalciferol)  50,000 Units Oral Q Thu  . Warfarin - Pharmacist Dosing Inpatient   Does not apply q1800    Dialysis Orders: [facility] -4 hours  -EDW 52-but has been leaving out above 50 for the last few treatments. HD Bath 2/2, Dialyzer 180, Heparin yes 1400 q. treatment. Access TDC-does have right upper AV graft placed 7/22 .  Hectorol 1 mcg q. treatment   Assessment/Plan: 1. Hypoxic respiratory failure likely d/t volume overload - improved. Still not to dry if weights correct.  2. ESRD - HD TTS. Extra HD yesterday with net 2.8L removed. To d/c to OP unit for dialysis per regular schedule.  3. Anemia of CKD- Hgb 9.7. Given 59mcg Aranesp yesterday.  4. Secondary hyperparathyroidism - Ca at goal. Last Pth 231. Continue VDRA.  5. HTN- BP improved post HD. 6. Nutrition - Renal diet w/fluid restrictions.   Jen Mow, PA-C [facility] Pager: [phone] 12/16/2017,9:49 AM  LOS: 1 day  Patient seen and examined, agree with above note with above modifications. Feels better and no longer hypoxic- got 2700 off yest for regular treatment.  I do want to lower EDW as OP just a bit so that we are sure to try and keep her as dry as possible to assist in sxms  Corliss Parish, MD 12/16/2017

## 2017-12-17 ENCOUNTER — Telehealth: Payer: Self-pay | Admitting: Emergency Medicine

## 2017-12-17 NOTE — Telephone Encounter (Signed)
Lm for Patient to Return Call

## 2017-12-18 DIAGNOSIS — E876 Hypokalemia: Secondary | ICD-10-CM | POA: Diagnosis not present

## 2017-12-18 DIAGNOSIS — N186 End stage renal disease: Secondary | ICD-10-CM | POA: Diagnosis not present

## 2017-12-18 DIAGNOSIS — Z4901 Encounter for fitting and adjustment of extracorporeal dialysis catheter: Secondary | ICD-10-CM | POA: Diagnosis not present

## 2017-12-18 DIAGNOSIS — N2581 Secondary hyperparathyroidism of renal origin: Secondary | ICD-10-CM | POA: Diagnosis not present

## 2017-12-18 DIAGNOSIS — D689 Coagulation defect, unspecified: Secondary | ICD-10-CM | POA: Diagnosis not present

## 2017-12-18 DIAGNOSIS — Z23 Encounter for immunization: Secondary | ICD-10-CM | POA: Diagnosis not present

## 2017-12-18 DIAGNOSIS — D631 Anemia in chronic kidney disease: Secondary | ICD-10-CM | POA: Diagnosis not present

## 2017-12-20 ENCOUNTER — Encounter: Payer: Self-pay | Admitting: Family Medicine

## 2017-12-20 ENCOUNTER — Ambulatory Visit (INDEPENDENT_AMBULATORY_CARE_PROVIDER_SITE_OTHER): Payer: Medicare Other | Admitting: Family Medicine

## 2017-12-20 VITALS — BP 162/68 | HR 80 | Temp 97.6°F | Resp 16 | Ht 59.0 in | Wt 118.4 lb

## 2017-12-20 DIAGNOSIS — I4891 Unspecified atrial fibrillation: Secondary | ICD-10-CM

## 2017-12-20 DIAGNOSIS — M79601 Pain in right arm: Secondary | ICD-10-CM | POA: Diagnosis not present

## 2017-12-20 DIAGNOSIS — I10 Essential (primary) hypertension: Secondary | ICD-10-CM | POA: Diagnosis not present

## 2017-12-20 DIAGNOSIS — J9601 Acute respiratory failure with hypoxia: Secondary | ICD-10-CM

## 2017-12-20 DIAGNOSIS — I428 Other cardiomyopathies: Secondary | ICD-10-CM

## 2017-12-20 MED ORDER — HYDROCODONE-ACETAMINOPHEN 5-325 MG PO TABS: 1.0000 | ORAL_TABLET | Freq: Four times a day (QID) | ORAL | 0 refills | Status: DC | PRN

## 2017-12-20 NOTE — Progress Notes (Signed)
12/20/2017  CC:  Chief Complaint  Patient presents with  . Hospitalization Follow-up    TCM    Patient is a 84 y.o. Caucasian female who presents accompanied by her daughter for  hospital follow up, specifically [facility] face-to-face visit. Dates hospitalized: 7/30-8/1, 2019. Days since d/c from hospital: 4 Patient was discharged from hospital to home. Reason for admission to hospital: Acute hypoxic respiratory failure secondary to volume overload-ESRD.  She gets HD on TTS, was admitted on a Tuesday after still feeling SOB after having her routine dialysis, and d/c'd home on a Thursday and went straight to get outpt dialysis. She apparently possibly had excess sodium intake (eating out/restaurant) more in the recent few days.   Date of interactive (phone) contact with patient and/or caregiver: none--we have not had sufficiency time to contact pt since pt was d/c'd home  I have reviewed patient's discharge summary plus pertinent specific notes, labs, and imaging from the hospitalization.  Pt has hx of chronic hypoxic resp failure and is a "sometimes" oxygen user. Acutely more hypoxic and dyspneic in ED, admitted for suspected volume overload.  PE ruled out with V/Q scan.  CT chest unremarkable.  CXR normal/no acute process.  Oxygen supplementation given. Pt was seen by nephrologist in hospital.  She had about a negative 2L fluid balance in hosp.  EDW 52 kg. She felt much improved and was d/c'd home off oxygen.  Feeling tired from not much rest in hosp, some R upper arm/axilla pain from recent R arm graft surgery 2 wks ago--otherwise doing fine. She has R chest wall central venous cath in place for HD at this time. No SOB currently.  No CP, no fevers.  No palpitations or racing heart.  No BRBPR, no melena, no nose bleeds.  Medication reconciliation was done today and patient is taking meds as recommended by discharging hospitalist/specialist.    PMH:  Past Medical  History:  Diagnosis Date  . Acute kidney injury superimposed on CKD ([place]) 08/05/2016  . Acute on chronic respiratory failure with hypoxia ([place])   . Acute on chronic systolic congestive heart failure ([place])   . Acute pulmonary edema with congestive heart failure ([place]) 11/30/2016  . Acute renal failure superimposed on chronic kidney disease ([place]) 07/11/2016  . Acute respiratory failure ([place]) 02/13/2017  . Anemia of chronic disease 2018   + anemia of CRI?  [name] Atrial fibrillation ([place]) [I48.91] 12/11/2016  . Atrophy of left kidney    with absent blood flow by renal artery dopplers (Dr. Gwenlyn Found)  . 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 Chronic combined systolic and diastolic CHF (congestive heart failure) ([place])    a. 11/2016: echo showing EF of 35-40%, RV strain noted, mild MR and mild TR.   [name] Chronic combined systolic and diastolic heart failure ([place]) 01/05/2017  . Chronic hypoxemic respiratory failure ([place])   . Chronic respiratory failure with hypoxia ([place]) 02/11/2017   Now on chronic O2  . CKD (chronic kidney disease), stage IV ([place]) 01/28/2016  . Debilitated patient    WC dependent as of 2018  . Dysrhythmia    afib  . Elevated troponin level 11/30/2016  . ESRD (end stage renal disease) ([place]) 02/13/2017  . ESRD on hemodialysis ([place]) 01/2017   T/Th/Sat schedule- [place].  Right basilic AV fistula 83/3825--[facility] unable to successfully access this.  Dr. Donnetta Hutching to do an upper arm AV graft as of 10/2017..  . Family history of adverse reaction to anesthesia  Daughter- nausea  . History of adenomatous polyp of colon   . History of kidney stones    passed  . History of subarachnoid hemorrhage 10/2014   after syncope and while on xarelto  . HOH (hard of hearing)   . Hyperlipidemia   . Hypertension    Difficult to control, in the setting of one functioning kidney: pt was referred to nephrology by Dr. Gwenlyn Found 06/2015.  . Lumbar radiculopathy 2012  . Lumbar spinal stenosis 2019   facet  injections only very short term relief.  As of 09/2017 ortho f/u, selective nerve root block planned.  . Lumbar spondylosis    MR 07/2016---no sign of spinal nerve compression or cord compression.  Pt set up with outpt ortho while admitted to hosp 07/2016.  . Malnourished ([place])   . Metatarsal fracture 06/10/2016   Nondisplaced, left 5th metatarsal--pt was referred to ortho  . Nonischemic cardiomyopathy ([place])   . Osteopenia 2014   T-score -2.1  . PAF (paroxysmal atrial fibrillation) (H[facility])    Eliquis started after BRAO and CVA.  She was changed to warfarin 2018--INR/coumadin mgmt per HD/nephrol.  . Palpitations 12/15/2016  . Personal history of subarachnoid hemorrhage 01/28/2016  . Presence of permanent cardiac pacemaker   . Pulmonary edema 11/2016  . 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 Right rib fracture 12/2016   s/p fall  . Sick sinus syndrome ([place])    Dual chamber pacer insertion 2018 (Dr. Lovena Le)  . Stroke [facility])    cardioembolic (had CVA while on no anticoag)--"scattered subacute punctate infarcts: 1 in R parietal lobe and 2 in occipital cortex" on MRI br.  CT angio head/neck: aortic arch athero.   . Tachycardia-bradycardia syndrome ([place]) - s/p PPM 12/07/2016   Pacer placed  . Thoracic back pain 01/2017   Facet?  Dr. Nelva Bush to do steroid injection as of 02/2017  . Thoracic compression fracture ([place]) 07/2016   T7 and T8-- T8 kyphoplasty during hosp admission 07/2016.  Neuro referred pt to pain mgmt for consideration of injection 09/2016.  I referred her to endo 08/2016 for consideration of calcitonin treatment.    PSH:  Past Surgical History:  Procedure Laterality Date  . APPENDECTOMY  child  . AV FISTULA PLACEMENT Right 03/31/2017   Procedure: Right ARM Basilic vein transposition;  Surgeon: Conrad Marmarth, MD;  [facility] OR;  Service: Vascular;  Laterality: Right;  . AV FISTULA PLACEMENT Right 12/06/2017   Procedure: INSERTION OF ARTERIOVENOUS (AV) GORE-TEX GRAFT  ARM RIGHT UPPER EXTREMITY;  Surgeon: [name], MD;  Location: [place];  Service: Vascular;  Laterality: Right;  . CARDIOVASCULAR STRESS TEST  01/31/2016   Stress myoview: NORMAL/Low risk.  EF 56%.  . [place]  . COLONOSCOPY  2015   + hx of adenomatous polyps.  Need digest health spec in [place] records to see when pt due for next colonoscopy  . EYE SURGERY Bilateral    Catarct  . INSERTION OF DIALYSIS CATHETER Right 01/29/2017   Procedure: INSERTION OF DIALYSIS CATHETER- RIGHT INTERNAL JUGULAR;  Surgeon: [name], MD;  Location: [facility] OR;  Service: Vascular;  Laterality: Right;  . IR GENERIC HISTORICAL  07/24/2016   IR KYPHO THORACIC WITH BONE BIOPSY 07/24/2016 Luanne Bras, MD [facility]-INTERV RAD  . KYPHOPLASTY  07/27/2016   T8  . PACEMAKER IMPLANT N/A 12/03/2016   Procedure: Pacemaker Implant;  Surgeon: Evans Lance, MD;  Location: [place] CV LAB;  Service: Cardiovascular;  Laterality: N/A;  . Renal artery dopplers  02/26/2016   Her right renal dimension was 11 cm pole to pole with mild to moderate right renal artery stenosis. A right renal aortic ratio was 3.22 suggesting less than a 50% stenosis.  . TONSILLECTOMY    . TRANSTHORACIC ECHOCARDIOGRAM  01/29/2016; 11/2016; 12/2017   2017: EF 55-60%, normal LV wall motion, grade I DD.  No cardiac source of emboli was seen.  2018: EF 35-40%, could not assess DD due to a-fib, pulm HTN noted. 12/2017 EF 55-60%, nl wall motion, grd II DD, mod MR and pulm regurg, increased pulm pressure (peak 52).    MEDS:  Outpatient Medications Prior to Visit  Medication Sig Dispense Refill  . acetaminophen (TYLENOL) 500 MG tablet Take 500-1,000 mg 3 (three) times daily as needed by mouth for moderate pain.     [name] amiodarone (PACERONE) 200 MG tablet Take 1 tablet (200 mg total) by mouth daily. KEEP OV. 90 tablet 0  . carvedilol (COREG) 3.125 MG tablet Take 1 tablet (3.125 mg total) by mouth 2 (two) times daily. 30 tablet 0  .  HYDROcodone-acetaminophen (NORCO) 5-325 MG tablet Take 1 tablet by mouth every 6 (six) hours as needed for moderate pain or severe pain. 10 tablet 0  . isosorbide mononitrate (IMDUR) 60 MG 24 hr tablet TAKE 1 TABLET DAILY BY  MOUTH. 90 tablet 1  . ondansetron (ZOFRAN) 4 MG tablet Take 0.5-1 tablets (2-4 mg total) by mouth every 8 (eight) hours as needed for nausea or vomiting. 90 tablet 3  . polyethylene glycol (MIRALAX / GLYCOLAX) packet Take 17 g by mouth daily as needed for mild constipation.    . pravastatin (PRAVACHOL) 40 MG tablet TAKE 1 TABLET EVERY EVENING BY MOUTH. 90 tablet 0  . sevelamer carbonate (RENVELA) 800 MG tablet Take 800 mg by mouth See admin instructions. Two to three times daily with meals    . vitamin C (ASCORBIC ACID) 500 MG tablet Take 500 mg by mouth daily.    . Vitamin D, Ergocalciferol, (DRISDOL) 50000 units CAPS capsule Take 1 capsule (50,000 Units total) by mouth every 7 (seven) days. (Patient taking differently: Take 50,000 Units by mouth every Thursday. ) 12 capsule 3  . warfarin (COUMADIN) 3 MG tablet Take 1/2 to 1 tablets daily as directed by coumadin clinic (Patient taking differently: Take 1.5-3 mg by mouth See admin instructions. Take 3 mg by mouth daily on Monday and Friday. Take 1.5 mg by mouth daily on all other days) 90 tablet 1  . enoxaparin (LOVENOX) 60 MG/0.6ML injection Inject 0.6 mLs (60 mg total) into the skin daily. (Patient not taking: Reported on 12/14/2017) 10 Syringe 0   No facility-administered medications prior to visit.    EXAM: BP (!) 162/68 (BP Location: Left Arm, Patient Position: Sitting, Cuff Size: Normal)   Pulse 80   Temp 97.6 F (36.4 C) (Oral)   Resp 16   Ht 4\' 11"  (1.499 m)   Wt 118 lb 6 oz (53.7 kg)   SpO2 95%   BMI 23.91 kg/m  Gen: Alert, well appearing.  Patient is oriented to person, place, time, and situation. AFFECT: pleasant, lucid thought and speech. MWU:XLKG: no injection, icteris, swelling, or exudate.  EOMI,  PERRLA. Mouth: lips without lesion/swelling.  Oral mucosa pink and moist. Oropharynx without erythema, exudate, or swelling.  CV: RRR, 2/6 systolic murmur, no diastolic murmur, no rub. Chest is clear, no wheezing or rales. Normal symmetric air entry throughout both lung fields.  No chest wall deformities or tenderness. EXT: no clubbing, cyanosis, or edema.    Pertinent labs/imaging  12/14/17: CT chest w/out contrast: IMPRESSION: Mild left lower lobe atelectasis. Negative for pneumonia or edema or effusion. Chronic compression fracture T8. Moderate compression from fracture T3 which may be recent. Correlate with pain in this area. Right axillary hematoma related to recent surgery for dialysis AV fistula placement. 1 cm nodular density right lower lobe possibly a small vascular malformation. Aortic Atherosclerosis (ICD10-I70.0).  V/Q scan 12/15/17: IMPRESSION: Nonspecific areas of ventilation, more so in the left lung, but no perfusion defect to suggest pulmonary embolus     Chemistry      Component Value Date/Time   NA 141 12/15/2017 0620   NA 137 09/01/2016   K 3.6 12/15/2017 0620   CL 100 12/15/2017 0620   CO2 31 12/15/2017 0620   BUN 18 12/15/2017 0620   BUN 51 (A) 09/01/2016   CREATININE 2.96 (H) 12/15/2017 0620   CREATININE 1.63 (H) 03/24/2016 0110   GLU 128 09/01/2016      Component Value Date/Time   CALCIUM 8.0 (L) 12/15/2017 0620   CALCIUM 9.5 02/16/2017 0232   ALKPHOS 60 12/15/2017 0620   AST 15 12/15/2017 0620   ALT 6 12/15/2017 0620   BILITOT 0.5 12/15/2017 0620   BILITOT 0.2 06/17/2016 0819     Lab Results  Component Value Date   TSH 1.097 12/15/2017   Lab Results  Component Value Date   WBC 7.6 12/15/2017   HGB 9.7 (L) 12/15/2017   HCT 31.9 (L) 12/15/2017   MCV 102.9 (H) 12/15/2017   PLT 196 12/15/2017   Lab Results  Component Value Date   INR 2.84 12/16/2017   INR 2.66 12/15/2017   INR 2.25 12/14/2017    ASSESSMENT/PLAN:  1) Acute  hypoxic resp failure: due to volume overload.  Resolved now that extra fluid removed with HD. She is still 1.7 kg above her EDW.  She'll continue with T/Th/Sat HD (next HD is tomorrow), at which time she'll get routine labs.  2) Nonischemic CM: echo in hosp showed normal EF but still with signif DD and mod MR and mod PR. Continue coreg and imdur.  3) A-fib: sounds regular today.  Continue amiodarone, coreg, and coumadin.  PT/INR followed by nephrology.  4) HTN: not ideal control.  HD helps her the most.  Hx of wide fluctuations in bp with attempts at titration/addition of bp meds.  No med changes today.  5) Right arm pain, s/p R arm AV graft done about 2 wks ago. She has a small hematoma in R axillary region. Vicodin 5/325, 1 tab q6h prn, #60 eRx'd today.  Medical decision making of moderate complexity was utilized today.  An After Visit Summary was printed and given to the patient.  FOLLOW UP:   3 mo CPE  Signed:  Crissie Sickles, MD           12/20/2017

## 2017-12-21 DIAGNOSIS — Z4901 Encounter for fitting and adjustment of extracorporeal dialysis catheter: Secondary | ICD-10-CM | POA: Diagnosis not present

## 2017-12-21 DIAGNOSIS — N186 End stage renal disease: Secondary | ICD-10-CM | POA: Diagnosis not present

## 2017-12-21 DIAGNOSIS — D631 Anemia in chronic kidney disease: Secondary | ICD-10-CM | POA: Diagnosis not present

## 2017-12-21 DIAGNOSIS — E876 Hypokalemia: Secondary | ICD-10-CM | POA: Diagnosis not present

## 2017-12-21 DIAGNOSIS — Z23 Encounter for immunization: Secondary | ICD-10-CM | POA: Diagnosis not present

## 2017-12-21 DIAGNOSIS — D689 Coagulation defect, unspecified: Secondary | ICD-10-CM | POA: Diagnosis not present

## 2017-12-21 DIAGNOSIS — N2581 Secondary hyperparathyroidism of renal origin: Secondary | ICD-10-CM | POA: Diagnosis not present

## 2017-12-22 ENCOUNTER — Ambulatory Visit (INDEPENDENT_AMBULATORY_CARE_PROVIDER_SITE_OTHER): Payer: Medicare Other | Admitting: Pharmacist Clinician (PhC)/ Clinical Pharmacy Specialist

## 2017-12-22 ENCOUNTER — Encounter: Payer: Self-pay | Admitting: Cardiovascular Disease

## 2017-12-22 ENCOUNTER — Ambulatory Visit (INDEPENDENT_AMBULATORY_CARE_PROVIDER_SITE_OTHER): Payer: Medicare Other | Admitting: Cardiovascular Disease

## 2017-12-22 DIAGNOSIS — I4891 Unspecified atrial fibrillation: Secondary | ICD-10-CM

## 2017-12-22 DIAGNOSIS — I5042 Chronic combined systolic (congestive) and diastolic (congestive) heart failure: Secondary | ICD-10-CM

## 2017-12-22 DIAGNOSIS — Z7901 Long term (current) use of anticoagulants: Secondary | ICD-10-CM | POA: Diagnosis not present

## 2017-12-22 LAB — POCT INR: INR: 1.9 — AB (ref 2.0–3.0)

## 2017-12-22 NOTE — Progress Notes (Signed)
12/22/2017 Valerie Terrell   [birth year]  [id number]  Primary Physician McGowen, Adrian Blackwater, MD Primary Cardiologist: Lorretta Harp MD [name], [place]  HPI:  Valerie Terrell is a 84 y.o.  widowed Caucasian female mother of one daughter [name] accompanies her today andwho I last saw in the office  11/16/2017. 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 She smoked remotely and drinks socially. She does have a history of difficult to control hypertension. Hyperlipidemia. She had a 2-D echo which was normal, a Myoview stress test which was normal and a CT angiogram performed 01/29/16 that showed no evidence of osseous stenosis. She does have difficult to control hypertension with a recent renal Doppler study performed 02/26/16 that showed an atrophic nonfunctioning left kidney and a right kidney of measuring 11 cm with at least a moderate right renal artery stenosis. Her serum creatinine in September was 1.15 and currently isn't elicited is in the 1.6 range potentially related to hay her ARB. Her ARB was stopped her serum creatinine fell to 1.4. She saw Dr. Erling Cruz, nephrology, who increased her hydralazine and clonidine. She was intolerant to her higher hydralazine dose and seems to be intolerant to the clonidine as well. She had difficult to control hypertension, progressive renal insufficiency and bilateral lower extremity edema on high-dose diuretics. She was hospitalized in July for combined systolic and diastolic heart failure and had a permanent transvenous pacemaker placed by Dr. Lovena Le for tachybradycardia syndrome. She was admitted again in September and began dialysis. Her EF in July was 35-40% which is reduced compared to the EF in September 60 to 65%.Her Eliquiswas changed to Coumadin. She is currently being dialyzed 3 times a week.  Her edema has improved as has her blood pressure control and her breathing. Since I saw her in the office November 2018 she is continued to do well.  She did have a mechanical fall earlier this year with laceration of her scalp and bleeding issues related to Coumadin.  This was not syncope.  She feels occasional palpitations in her chest and is scheduled to see Dr. Lovena Le later this month for pacemaker follow-up.  She had a fistula placed by Dr. Donnetta Hutching in late July with some subsequent ecchymosis and hematoma. She was admitted to [facility] on 730 and discharged 2 days later because of volume overload and respiratory distress.  She says this was related to dietary indiscretion with regards to salt (French fries at [employer]).  She was dialyzed and feels clinically improved.  Her weight is down 4 pounds.  Interestingly, a 2D echo performed 12/15/2017 revealed EF of 50 to 55%, markedly improved from her prior echo of 35%.  She is on Coumadin for PAF with an INR measured at 1.9.  She did see Dr. Lovena Le in the office on 12/10/2017 which time her pacemaker was functioning well.    Current Meds  Medication Sig  . acetaminophen (TYLENOL) 500 MG tablet Take 500-1,000 mg 3 (three) times daily as needed by mouth for moderate pain.   [name] amiodarone (PACERONE) 200 MG tablet Take 1 tablet (200 mg total) by mouth daily. KEEP OV.  . carvedilol (COREG) 3.125 MG tablet Take 1 tablet (3.125 mg total) by mouth 2 (two) times daily.  [name] HYDROcodone-acetaminophen (NORCO) 5-325 MG tablet Take 1 tablet by mouth every  6 (six) hours as needed for moderate pain or severe pain.  . isosorbide mononitrate (IMDUR) 60 MG 24 hr tablet TAKE 1 TABLET DAILY BY  MOUTH.  . ondansetron (ZOFRAN) 4 MG tablet Take 0.5-1 tablets (2-4 mg total) by mouth every 8 (eight) hours as needed for nausea or vomiting.  . polyethylene glycol (MIRALAX / GLYCOLAX) packet Take 17 g by mouth daily as needed for mild constipation.  . pravastatin  (PRAVACHOL) 40 MG tablet TAKE 1 TABLET EVERY EVENING BY MOUTH.  . sevelamer carbonate (RENVELA) 800 MG tablet Take 800 mg by mouth See admin instructions. Two to three times daily with meals  . vitamin C (ASCORBIC ACID) 500 MG tablet Take 500 mg by mouth daily.  . Vitamin D, Ergocalciferol, (DRISDOL) 50000 units CAPS capsule Take 1 capsule (50,000 Units total) by mouth every 7 (seven) days. (Patient taking differently: Take 50,000 Units by mouth every Thursday. )  . warfarin (COUMADIN) 3 MG tablet Take 1/2 to 1 tablets daily as directed by coumadin clinic (Patient taking differently: Take 1.5-3 mg by mouth See admin instructions. Take 3 mg by mouth daily on Monday and Friday. Take 1.5 mg by mouth daily on all other days)     Allergies  Allergen Reactions  . Clonidine Derivatives Palpitations and Other (See Comments)    VERY SEDATED  . Adhesive [Tape] Other (See Comments)    Tears skin up  . Codeine Nausea Only  . Nickel Rash  . Sulfa Antibiotics Other (See Comments)    Reaction unknown    Social History   Socioeconomic History  . Marital status: Widowed    Spouse name: Not on file  . Number of children: Not on file  . Years of education: Not on file  . Highest education level: Not on file  Occupational History  . Not on file  Social Needs  . Financial resource strain: Not on file  . Food insecurity:    Worry: Not on file    Inability: Not on file  . Transportation needs:    Medical: Not on file    Non-medical: Not on file  Tobacco Use  . Smoking status: Former Smoker    Years: 22.00    Last attempt to quit: 03/17/1972    Years since quitting: 45.7  . Smokeless tobacco: Never Used  Substance and Sexual Activity  . Alcohol use: Yes    Alcohol/week: 0.6 oz    Types: 1 Glasses of wine per week    Comment: wine  . Drug use: No  . Sexual activity: Never  Lifestyle  . Physical activity:    Days per week: Not on file    Minutes per session: Not on file  . Stress: Not on  file  Relationships  . Social connections:    Talks on phone: Not on file    Gets together: Not on file    Attends religious service: Not on file    Active member of club or organization: Not on file    Attends meetings of clubs or organizations: Not on file    Relationship status: Not on file  . Intimate partner violence:    Fear of current or ex partner: Not on file    Emotionally abused: Not on file    Physically abused: Not on file    Forced sexual activity: Not on file  Other Topics Concern  . Not on file  Social History Narrative   Widow.  One daughter, lives with her.  Educ: college   Occup: retired Marine scientist.   No T/A/Ds.   She is almost a vegetarian.     Review of Systems: General: negative for chills, fever, night sweats or weight changes.  Cardiovascular: negative for chest pain, dyspnea on exertion, edema, orthopnea, palpitations, paroxysmal nocturnal dyspnea or shortness of breath Dermatological: negative for rash Respiratory: negative for cough or wheezing Urologic: negative for hematuria Abdominal: negative for nausea, vomiting, diarrhea, bright red blood per rectum, melena, or hematemesis Neurologic: negative for visual changes, syncope, or dizziness All other systems reviewed and are otherwise negative except as noted above.    Blood pressure 130/60, pulse 75, height 4\' 11"  (1.499 m), weight 116 lb 3.2 oz (52.7 kg), SpO2 93 %.  General appearance: alert and no distress Neck: no adenopathy, no carotid bruit, no JVD, supple, symmetrical, trachea midline and thyroid not enlarged, symmetric, no tenderness/mass/nodules Lungs: clear to auscultation bilaterally Heart: regular rate and rhythm, S1, S2 normal, no murmur, click, rub or gallop Extremities: extremities normal, atraumatic, no cyanosis or edema Pulses: 2+ and symmetric Skin: Skin color, texture, turgor normal. No rashes or lesions Neurologic: Alert and oriented X 3, normal strength and tone. Normal  symmetric reflexes. Normal coordination and gait  EKG not performed today  ASSESSMENT AND PLAN:   Chronic combined systolic and diastolic heart failure [facility]) Ms. Koy returns today after recently being discharged from [facility] 12/16/2017 after being admitted for 2 days for volume overload secondary to dietary indiscretion.  Her EF in the past is been 35%.  She was dialyzed during her hospitalization and felt clinically improved.  2D echo performed during her hospitalization revealed an EF of 55 to 60% on 12/15/2017 with grade 2 diastolic dysfunction.  This represents improvement in her heart function.  She is aware of salt restriction and gets dialyzed on a regular basis.  She feels clinically improved.      Lorretta Harp MD FACP,FACC,FAHA, [facility] 12/22/2017 11:23 AM

## 2017-12-22 NOTE — Patient Instructions (Signed)
Your physician wants you to follow-up in: [place] will receive a reminder letter in the mail two months in advance. If you don't receive a letter, please call our office to schedule the follow-up appointment.   Your physician wants you to follow-up in: [place] will receive a reminder letter in the mail two months in advance. If you don't receive a letter, please call our office to schedule the follow-up appointment.   If you need a refill on your cardiac medications before your next appointment, please call your pharmacy.

## 2017-12-22 NOTE — Assessment & Plan Note (Signed)
Valerie Terrell returns today after recently being discharged from [facility] 12/16/2017 after being admitted for 2 days for volume overload secondary to dietary indiscretion.  Her EF in the past is been 35%.  She was dialyzed during her hospitalization and felt clinically improved.  2D echo performed during her hospitalization revealed an EF of 55 to 60% on 12/15/2017 with grade 2 diastolic dysfunction.  This represents improvement in her heart function.  She is aware of salt restriction and gets dialyzed on a regular basis.  She feels clinically improved.

## 2017-12-23 DIAGNOSIS — E876 Hypokalemia: Secondary | ICD-10-CM | POA: Diagnosis not present

## 2017-12-23 DIAGNOSIS — N186 End stage renal disease: Secondary | ICD-10-CM | POA: Diagnosis not present

## 2017-12-23 DIAGNOSIS — N2581 Secondary hyperparathyroidism of renal origin: Secondary | ICD-10-CM | POA: Diagnosis not present

## 2017-12-23 DIAGNOSIS — Z23 Encounter for immunization: Secondary | ICD-10-CM | POA: Diagnosis not present

## 2017-12-23 DIAGNOSIS — D689 Coagulation defect, unspecified: Secondary | ICD-10-CM | POA: Diagnosis not present

## 2017-12-23 DIAGNOSIS — D631 Anemia in chronic kidney disease: Secondary | ICD-10-CM | POA: Diagnosis not present

## 2017-12-23 DIAGNOSIS — Z4901 Encounter for fitting and adjustment of extracorporeal dialysis catheter: Secondary | ICD-10-CM | POA: Diagnosis not present

## 2017-12-24 ENCOUNTER — Other Ambulatory Visit: Payer: Self-pay | Admitting: Cardiovascular Disease

## 2017-12-25 DIAGNOSIS — N2581 Secondary hyperparathyroidism of renal origin: Secondary | ICD-10-CM | POA: Diagnosis not present

## 2017-12-25 DIAGNOSIS — Z4901 Encounter for fitting and adjustment of extracorporeal dialysis catheter: Secondary | ICD-10-CM | POA: Diagnosis not present

## 2017-12-25 DIAGNOSIS — D631 Anemia in chronic kidney disease: Secondary | ICD-10-CM | POA: Diagnosis not present

## 2017-12-25 DIAGNOSIS — E876 Hypokalemia: Secondary | ICD-10-CM | POA: Diagnosis not present

## 2017-12-25 DIAGNOSIS — Z23 Encounter for immunization: Secondary | ICD-10-CM | POA: Diagnosis not present

## 2017-12-25 DIAGNOSIS — D689 Coagulation defect, unspecified: Secondary | ICD-10-CM | POA: Diagnosis not present

## 2017-12-25 DIAGNOSIS — N186 End stage renal disease: Secondary | ICD-10-CM | POA: Diagnosis not present

## 2017-12-27 ENCOUNTER — Other Ambulatory Visit: Payer: Self-pay | Admitting: Cardiovascular Disease

## 2017-12-27 NOTE — Telephone Encounter (Signed)
Rx request sent to pharmacy.  

## 2017-12-28 DIAGNOSIS — D631 Anemia in chronic kidney disease: Secondary | ICD-10-CM | POA: Diagnosis not present

## 2017-12-28 DIAGNOSIS — Z4901 Encounter for fitting and adjustment of extracorporeal dialysis catheter: Secondary | ICD-10-CM | POA: Diagnosis not present

## 2017-12-28 DIAGNOSIS — E876 Hypokalemia: Secondary | ICD-10-CM | POA: Diagnosis not present

## 2017-12-28 DIAGNOSIS — D689 Coagulation defect, unspecified: Secondary | ICD-10-CM | POA: Diagnosis not present

## 2017-12-28 DIAGNOSIS — N186 End stage renal disease: Secondary | ICD-10-CM | POA: Diagnosis not present

## 2017-12-28 DIAGNOSIS — N2581 Secondary hyperparathyroidism of renal origin: Secondary | ICD-10-CM | POA: Diagnosis not present

## 2017-12-28 DIAGNOSIS — Z23 Encounter for immunization: Secondary | ICD-10-CM | POA: Diagnosis not present

## 2017-12-30 ENCOUNTER — Inpatient Hospital Stay: Payer: Self-pay | Admitting: Family Medicine

## 2017-12-30 DIAGNOSIS — N2581 Secondary hyperparathyroidism of renal origin: Secondary | ICD-10-CM | POA: Diagnosis not present

## 2017-12-30 DIAGNOSIS — Z23 Encounter for immunization: Secondary | ICD-10-CM | POA: Diagnosis not present

## 2017-12-30 DIAGNOSIS — Z4901 Encounter for fitting and adjustment of extracorporeal dialysis catheter: Secondary | ICD-10-CM | POA: Diagnosis not present

## 2017-12-30 DIAGNOSIS — D689 Coagulation defect, unspecified: Secondary | ICD-10-CM | POA: Diagnosis not present

## 2017-12-30 DIAGNOSIS — D631 Anemia in chronic kidney disease: Secondary | ICD-10-CM | POA: Diagnosis not present

## 2017-12-30 DIAGNOSIS — N186 End stage renal disease: Secondary | ICD-10-CM | POA: Diagnosis not present

## 2017-12-30 DIAGNOSIS — E876 Hypokalemia: Secondary | ICD-10-CM | POA: Diagnosis not present

## 2017-12-31 NOTE — ED Provider Notes (Signed)
[facility] Provider Note   CSN: [id number] Arrival date & time: 12/14/17  1710     History   Chief Complaint Chief Complaint  Patient presents with  . Shortness of Breath    HPI Valerie Terrell is a 84 y.o. female.  HPI Patient is an 84 year old female presents emergency department increasing orthopnea and exertional shortness of breath.  She is dialysis patient Tuesday Thursday Saturday.  She had dialysis today and had 2.2 L drawn off.  She states despite this she continues to feel short of breath.  She had mild hypoxia.  No history of congestive heart failure.  She has had some dietary discretion in the past several days.  She is on Coumadin.  She was off Coumadin for several days for surgery but today is therapeutic with an INR greater than 2.  Some cough.  No fevers or chills.  No productive cough.  No unilateral leg swelling.  No history of DVT or pulmonary embolism.   Past Medical History:  Diagnosis Date  . Anemia of chronic disease 2018   + anemia of CRI?  [name] Atrial fibrillation ([place]) [I48.91] 12/11/2016  . Atrophy of left kidney    with absent blood flow by renal artery dopplers (Dr. Gwenlyn Found)  . 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 Chronic combined systolic and diastolic CHF (congestive heart failure) ([place])    a. 11/2016: echo showing EF of 35-40%, RV strain noted, mild MR and mild TR.   Echo showed much improved EF 12/2017 (55-60%)  . Chronic respiratory failure with hypoxia ([place]) 02/11/2017   Now on chronic O2 (intermittent as of summer 2019)  . Debilitated patient    WC dependent as of 2018  . ESRD on hemodialysis ([place]) 01/2017   T/Th/Sat schedule- [place].  Right basilic AV fistula 37/6283--TDV unable to successfully access this.  Dr. Donnetta Hutching to do an upper arm AV graft as of 10/2017..  . History of adenomatous polyp of colon   . History of kidney stones    passed  . History of subarachnoid hemorrhage 10/2014   after syncope and  while on xarelto  . HOH (hard of hearing)   . Hyperlipidemia   . Hypertension    Difficult to control, in the setting of one functioning kidney: pt was referred to nephrology by Dr. Gwenlyn Found 06/2015.  . Lumbar radiculopathy 2012  . Lumbar spinal stenosis 2019   facet injections only very short term relief.  As of 09/2017 ortho f/u, selective nerve root block planned.  . Lumbar spondylosis    MR 07/2016---no sign of spinal nerve compression or cord compression.  Pt set up with outpt ortho while admitted to hosp 07/2016.  . Malnourished ([place])   . Metatarsal fracture 06/10/2016   Nondisplaced, left 5th metatarsal--pt was referred to ortho  . Nonischemic cardiomyopathy ([place])    EF improved to 55-60% on echo 12/2017  . Osteopenia 2014   T-score -2.1  . PAF (paroxysmal atrial fibrillation) (HCC)    Eliquis started after BRAO and CVA.  She was changed to warfarin 2018--INR/coumadin mgmt per HD/nephrol.  . Presence of permanent cardiac pacemaker   . Right rib fracture 12/2016   s/p fall  . Sick sinus syndrome ([place]) 11/2016   Dual chamber pacer insertion 2018 (Dr. Lovena Le)  . Stroke [facility])    cardioembolic (had CVA while on no anticoag)--"scattered subacute punctate infarcts: 1 in R parietal lobe and 2 in occipital cortex" on MRI br.  CT angio head/neck: aortic arch athero.   . Thoracic back pain 01/2017   Facet?  Dr. Nelva Bush to do steroid injection as of 02/2017  . Thoracic compression fracture ([place]) 07/2016   T7 and T8-- T8 kyphoplasty during hosp admission 07/2016.  Neuro referred pt to pain mgmt for consideration of injection 09/2016.  I referred her to endo 08/2016 for consideration of calcitonin treatment.    Patient Active Problem List   Diagnosis Date Noted  . Hypoxia 12/14/2017  . Dyspnea 12/14/2017  . ESRD (end stage renal disease) ([place]) 02/13/2017  . Acute respiratory failure ([place]) 02/13/2017  . Hyperkalemia 02/13/2017  . ESRD on dialysis ([place]) 02/11/2017  . Chronic respiratory failure  with hypoxia ([place]) 02/11/2017  . Debilitated patient 02/07/2017  . Poor tolerance for ambulation 02/07/2017  . Fracture of one rib, right side, initial encounter for closed fracture 01/11/2017  . Acute on chronic systolic congestive heart failure ([place])   . Closed fracture of one rib of right side   . Chronic combined systolic and diastolic heart failure ([place]) 01/05/2017  . Palpitations 12/15/2016  . Hypokalemia 12/14/2016  . Leukocytosis 12/14/2016  . Atrial fibrillation ([place]) [I48.91] 12/11/2016  . Long term (current) use of anticoagulants [Z79.01] 12/11/2016  . Tachycardia-bradycardia syndrome ([place]) - s/p PPM 12/07/2016  . Non-ischemic cardiomyopathy ([place]) 12/07/2016  . Acute combined systolic and diastolic heart failure ([place])  12/07/2016  . Cardiac pacemaker   . Acute on chronic respiratory failure with hypoxia ([place])   . Acute pulmonary edema with congestive heart failure ([place]) 11/30/2016  . Elevated troponin level 11/30/2016  . Anemia 11/30/2016  . Chronic midline thoracic back pain 09/15/2016  . Acute CVA (cerebrovascular accident) ([place]) 09/15/2016  . Hyperlipidemia   . Nausea & vomiting 08/05/2016  . Dehydration 08/05/2016  . Acute kidney injury superimposed on CKD ([place]) 08/05/2016  . Osteoporosis 07/28/2016  . Closed compression fracture of thoracic vertebra ([place]) 07/23/2016  . Thoracic compression fracture ([place]) 07/22/2016  . Acute renal failure superimposed on chronic kidney disease ([place]) 07/11/2016  . Flank pain 07/11/2016  . Hypoalbuminemia 07/11/2016  . Renal artery stenosis ([place]) 06/24/2016  . Nondisplaced fracture of fifth metatarsal bone, left foot, initial encounter for closed fracture 06/10/2016  . Rib contusion, left, initial encounter 06/10/2016  . Single kidney 03/13/2016  . Chest pain   . Dyslipidemia   . PAF (paroxysmal atrial fibrillation) ([place]) 01/28/2016  . Essential hypertension 01/28/2016  . History of cardioembolic stroke 46/27/0350  . Retinal  artery branch occlusion of left eye 01/28/2016  . Personal history of subarachnoid hemorrhage 01/28/2016  . CKD (chronic kidney disease), stage IV ([place]) 01/28/2016    Past Surgical History:  Procedure Laterality Date  . APPENDECTOMY  child  . AV FISTULA PLACEMENT Right 03/31/2017   Procedure: Right ARM Basilic vein transposition;  Surgeon: Conrad Schoenchen, MD;  Location: [facility] OR;  Service: Vascular;  Laterality: Right;  . AV FISTULA PLACEMENT Right 12/06/2017   Procedure: INSERTION OF ARTERIOVENOUS (AV) GORE-TEX GRAFT ARM RIGHT UPPER EXTREMITY;  Surgeon: [name], MD;  Location: [facility];  Service: Vascular;  Laterality: Right;  . CARDIOVASCULAR STRESS TEST  01/31/2016   Stress myoview: NORMAL/Low risk.  EF 56%.  . [place]  . COLONOSCOPY  2015   + hx of adenomatous polyps.  Need digest health spec in [place] records to see when pt due for next colonoscopy  . EYE SURGERY Bilateral    Catarct  . INSERTION OF DIALYSIS CATHETER  Right 01/29/2017   Procedure: INSERTION OF DIALYSIS CATHETER- RIGHT INTERNAL JUGULAR;  Surgeon: [name], MD;  Location: [facility] OR;  Service: Vascular;  Laterality: Right;  . IR GENERIC HISTORICAL  07/24/2016   IR KYPHO THORACIC WITH BONE BIOPSY 07/24/2016 Luanne Bras, MD [facility]-INTERV RAD  . KYPHOPLASTY  07/27/2016   T8  . PACEMAKER IMPLANT N/A 12/03/2016   Procedure: Pacemaker Implant;  Surgeon: Evans Lance, MD;  Location: [facility] CV LAB;  Service: Cardiovascular;  Laterality: N/A;  . Renal artery dopplers  02/26/2016   Her right renal dimension was 11 cm pole to pole with mild to moderate right renal artery stenosis. A right renal aortic ratio was 3.22 suggesting less than a 50% stenosis.  . Right upper arm AV Gore-Tex graft  12/06/2017   Dr. Donnetta Hutching  . TONSILLECTOMY    . TRANSTHORACIC ECHOCARDIOGRAM  01/29/2016; 11/2016; 12/2017   2017: EF 55-60%, normal LV wall motion, grade I DD.  No cardiac source of emboli was seen.  2018: EF 35-40%, could not  assess DD due to a-fib, pulm HTN noted. 12/2017 EF 55-60%, nl wall motion, grd II DD, mod MR and pulm regurg, increased pulm pressure (peak 52).     OB History   None      Home Medications    Prior to Admission medications   Medication Sig Start Date End Date Taking? Authorizing Provider  acetaminophen (TYLENOL) 500 MG tablet Take 500-1,000 mg 3 (three) times daily as needed by mouth for moderate pain.    Yes [provider]  carvedilol (COREG) 3.125 MG tablet Take 1 tablet (3.125 mg total) by mouth 2 (two) times daily. 07/21/17  Yes Lorretta Harp, MD  isosorbide mononitrate (IMDUR) 60 MG 24 hr tablet TAKE 1 TABLET DAILY BY  MOUTH. 08/18/17  Yes McGowen, Adrian Blackwater, MD  ondansetron (ZOFRAN) 4 MG tablet Take 0.5-1 tablets (2-4 mg total) by mouth every 8 (eight) hours as needed for nausea or vomiting. 12/10/16  Yes McGowen, Adrian Blackwater, MD  polyethylene glycol (MIRALAX / GLYCOLAX) packet Take 17 g by mouth daily as needed for mild constipation.   Yes [provider]  pravastatin (PRAVACHOL) 40 MG tablet TAKE 1 TABLET EVERY EVENING BY MOUTH. 11/15/17  Yes McGowen, Adrian Blackwater, MD  sevelamer carbonate (RENVELA) 800 MG tablet Take 800 mg by mouth See admin instructions. Two to three times daily with meals   Yes [provider]  vitamin C (ASCORBIC ACID) 500 MG tablet Take 500 mg by mouth daily.   Yes [provider]  Vitamin D, Ergocalciferol, (DRISDOL) 50000 units CAPS capsule Take 1 capsule (50,000 Units total) by mouth every 7 (seven) days. Patient taking differently: Take 50,000 Units by mouth every Thursday.  05/03/17  Yes McGowen, Adrian Blackwater, MD  amiodarone (PACERONE) 200 MG tablet TAKE 1 TABLET BY MOUTH  DAILY 12/27/17   Lorretta Harp, MD  HYDROcodone-acetaminophen (NORCO) 5-325 MG tablet Take 1 tablet by mouth every 6 (six) hours as needed for moderate pain or severe pain. 12/20/17   McGowenAdrian Blackwater, MD  warfarin (COUMADIN) 3 MG tablet TAKE 1/2 TO 1 TABLET BY   MOUTH DAILY AS DIRECTED BY  COUMADIN CLINIC 12/24/17   Lorretta Harp, MD    Family History Family History  Problem Relation Age of Onset  . Cancer Mother   . Heart disease Father   . Early death Father   . Sudden Cardiac Death Neg Hx     Social History  Social History   Tobacco Use  . Smoking status: Former Smoker    Years: 22.00    Last attempt to quit: 03/17/1972    Years since quitting: 45.8  . Smokeless tobacco: Never Used  Substance Use Topics  . Alcohol use: Yes    Alcohol/week: 1.0 standard drinks    Types: 1 Glasses of wine per week    Comment: wine  . Drug use: No     Allergies   Clonidine derivatives; Adhesive [tape]; Codeine; Nickel; and Sulfa antibiotics   Review of Systems Review of Systems  All other systems reviewed and are negative.    Physical Exam Updated Vital Signs BP (!) 131/50 (BP Location: Left Arm)   Pulse 74   Temp 99.2 F (37.3 C) (Oral)   Resp 18   Ht 4\' 11"  (1.499 m)   Wt 56.5 kg   SpO2 93%   BMI 25.16 kg/m   Physical Exam  Constitutional: She is oriented to person, place, and time. She appears well-developed and well-nourished. No distress.  HENT:  Head: Normocephalic and atraumatic.  Eyes: EOM are normal.  Neck: Normal range of motion.  Cardiovascular: Normal rate, regular rhythm and normal heart sounds.  Pulmonary/Chest: Effort normal and breath sounds normal.  Abdominal: Soft. She exhibits no distension. There is no tenderness.  Musculoskeletal: Normal range of motion.  Neurological: She is alert and oriented to person, place, and time.  Skin: Skin is warm and dry.  Psychiatric: She has a normal mood and affect. Judgment normal.  Nursing note and vitals reviewed.    ED Treatments / Results  Labs (all labs ordered are listed, but only abnormal results are displayed) Labs Reviewed  BASIC METABOLIC PANEL - Abnormal; Notable for the following components:      Result Value   Chloride 97 (*)    CO2 33 (*)     Creatinine, Ser 1.89 (*)    Calcium 8.0 (*)    GFR calc non Af Amer 24 (*)    GFR calc Af Amer 27 (*)    All other components within normal limits  CBC - Abnormal; Notable for the following components:   RBC 3.44 (*)    Hemoglobin 11.0 (*)    HCT 34.9 (*)    MCV 101.5 (*)    All other components within normal limits  HEPATIC FUNCTION PANEL - Abnormal; Notable for the following components:   Albumin 3.0 (*)    All other components within normal limits  PROTIME-INR - Abnormal; Notable for the following components:   Prothrombin Time 24.7 (*)    All other components within normal limits  APTT - Abnormal; Notable for the following components:   aPTT 69 (*)    All other components within normal limits  BRAIN NATRIURETIC PEPTIDE - Abnormal; Notable for the following components:   B Natriuretic Peptide 1,971.8 (*)    All other components within normal limits  COMPREHENSIVE METABOLIC PANEL - Abnormal; Notable for the following components:   Creatinine, Ser 2.96 (*)    Calcium 8.0 (*)    Total Protein 5.9 (*)    Albumin 2.6 (*)    GFR calc non Af Amer 14 (*)    GFR calc Af Amer 16 (*)    All other components within normal limits  CBC - Abnormal; Notable for the following components:   RBC 3.10 (*)    Hemoglobin 9.7 (*)    HCT 31.9 (*)    MCV 102.9 (*)  All other components within normal limits  TROPONIN I - Abnormal; Notable for the following components:   Troponin I 0.04 (*)    All other components within normal limits  PROTIME-INR - Abnormal; Notable for the following components:   Prothrombin Time 28.1 (*)    All other components within normal limits  PROTIME-INR - Abnormal; Notable for the following components:   Prothrombin Time 29.6 (*)    All other components within normal limits  I-STAT ARTERIAL BLOOD GAS, ED - Abnormal; Notable for the following components:   pO2, Arterial 132.0 (*)    Bicarbonate 32.6 (*)    TCO2 34 (*)    Acid-Base Excess 7.0 (*)    All other  components within normal limits  MRSA PCR SCREENING  TSH  I-STAT TROPONIN, ED    EKG EKG Interpretation  Date/Time:  Tuesday December 14 2017 17:16:41 EDT Ventricular Rate:  75 PR Interval:  160 QRS Duration: 88 QT Interval:  414 QTC Calculation: 462 R Axis:   111 Text Interpretation:  Normal sinus rhythm Right axis deviation Abnormal ECG No significant change was found Confirmed by Jola Schmidt [phone]) on 12/14/2017 6:28:19 PM Also confirmed by Jola Schmidt [phone]), editor Philomena Doheny [phone])  on 12/15/2017 7:15:50 AM   Radiology No results found.  Procedures Procedures (including critical care time)  Medications Ordered in ED Medications  acetaminophen (TYLENOL) tablet 1,000 mg (1,000 mg Oral Given 12/14/17 2004)  warfarin (COUMADIN) tablet 2.5 mg (2.5 mg Oral Given 12/14/17 2251)  technetium TC 12M diethylenetriame-pentaacetic acid (DTPA) injection 30 millicurie (30 millicuries Inhalation Given 12/15/17 0753)  technetium albumin aggregated (MAA) injection solution 4 millicurie (4 millicuries Intravenous Contrast Given 12/15/17 0753)  warfarin (COUMADIN) tablet 1.5 mg (1.5 mg Oral Given 12/15/17 1716)     Initial Impression / Assessment and Plan / ED Course  I have reviewed the triage vital signs and the nursing notes.  Pertinent labs & imaging results that were available during my care of the patient were reviewed by me and considered in my medical decision making (see chart for details).     Patient with acute respiratory failure and hypoxia despite dialysis today.  Chest x-ray without obvious abnormality.  Initially plan for CT angios to evaluate for PE but patient is concerned about her remaining renal function as she still continues to make some urine.  Noncontrasted CT demonstrates no significant abnormality.  Likely represents volume overload.  Patient will be admitted to the hospitalist service given her new hypoxia.  Patient and family updated.  Final Clinical  Impressions(s) / ED Diagnoses   Final diagnoses:  Acute respiratory failure with hypoxia [facility])    ED Discharge Orders         Ordered     12/16/17 0942     12/16/17 0942     12/16/17 8299           Jola Schmidt, MD 12/31/17 2312

## 2018-01-01 DIAGNOSIS — D689 Coagulation defect, unspecified: Secondary | ICD-10-CM | POA: Diagnosis not present

## 2018-01-01 DIAGNOSIS — Z4901 Encounter for fitting and adjustment of extracorporeal dialysis catheter: Secondary | ICD-10-CM | POA: Diagnosis not present

## 2018-01-01 DIAGNOSIS — E876 Hypokalemia: Secondary | ICD-10-CM | POA: Diagnosis not present

## 2018-01-01 DIAGNOSIS — N2581 Secondary hyperparathyroidism of renal origin: Secondary | ICD-10-CM | POA: Diagnosis not present

## 2018-01-01 DIAGNOSIS — N186 End stage renal disease: Secondary | ICD-10-CM | POA: Diagnosis not present

## 2018-01-01 DIAGNOSIS — D631 Anemia in chronic kidney disease: Secondary | ICD-10-CM | POA: Diagnosis not present

## 2018-01-01 DIAGNOSIS — Z23 Encounter for immunization: Secondary | ICD-10-CM | POA: Diagnosis not present

## 2018-01-04 DIAGNOSIS — E876 Hypokalemia: Secondary | ICD-10-CM | POA: Diagnosis not present

## 2018-01-04 DIAGNOSIS — N2581 Secondary hyperparathyroidism of renal origin: Secondary | ICD-10-CM | POA: Diagnosis not present

## 2018-01-04 DIAGNOSIS — N186 End stage renal disease: Secondary | ICD-10-CM | POA: Diagnosis not present

## 2018-01-04 DIAGNOSIS — D689 Coagulation defect, unspecified: Secondary | ICD-10-CM | POA: Diagnosis not present

## 2018-01-04 DIAGNOSIS — Z4901 Encounter for fitting and adjustment of extracorporeal dialysis catheter: Secondary | ICD-10-CM | POA: Diagnosis not present

## 2018-01-04 DIAGNOSIS — Z23 Encounter for immunization: Secondary | ICD-10-CM | POA: Diagnosis not present

## 2018-01-04 DIAGNOSIS — D631 Anemia in chronic kidney disease: Secondary | ICD-10-CM | POA: Diagnosis not present

## 2018-01-05 ENCOUNTER — Ambulatory Visit (INDEPENDENT_AMBULATORY_CARE_PROVIDER_SITE_OTHER): Payer: Medicare Other | Admitting: Pharmacist Clinician (PhC)/ Clinical Pharmacy Specialist

## 2018-01-05 DIAGNOSIS — Z7901 Long term (current) use of anticoagulants: Secondary | ICD-10-CM | POA: Diagnosis not present

## 2018-01-05 DIAGNOSIS — I4891 Unspecified atrial fibrillation: Secondary | ICD-10-CM | POA: Diagnosis not present

## 2018-01-05 LAB — POCT INR: INR: 2.3 (ref 2.0–3.0)

## 2018-01-06 DIAGNOSIS — N2581 Secondary hyperparathyroidism of renal origin: Secondary | ICD-10-CM | POA: Diagnosis not present

## 2018-01-06 DIAGNOSIS — N186 End stage renal disease: Secondary | ICD-10-CM | POA: Diagnosis not present

## 2018-01-06 DIAGNOSIS — D689 Coagulation defect, unspecified: Secondary | ICD-10-CM | POA: Diagnosis not present

## 2018-01-06 DIAGNOSIS — E876 Hypokalemia: Secondary | ICD-10-CM | POA: Diagnosis not present

## 2018-01-06 DIAGNOSIS — Z23 Encounter for immunization: Secondary | ICD-10-CM | POA: Diagnosis not present

## 2018-01-06 DIAGNOSIS — Z4901 Encounter for fitting and adjustment of extracorporeal dialysis catheter: Secondary | ICD-10-CM | POA: Diagnosis not present

## 2018-01-06 DIAGNOSIS — D631 Anemia in chronic kidney disease: Secondary | ICD-10-CM | POA: Diagnosis not present

## 2018-01-08 DIAGNOSIS — E876 Hypokalemia: Secondary | ICD-10-CM | POA: Diagnosis not present

## 2018-01-08 DIAGNOSIS — D689 Coagulation defect, unspecified: Secondary | ICD-10-CM | POA: Diagnosis not present

## 2018-01-08 DIAGNOSIS — Z23 Encounter for immunization: Secondary | ICD-10-CM | POA: Diagnosis not present

## 2018-01-08 DIAGNOSIS — Z4901 Encounter for fitting and adjustment of extracorporeal dialysis catheter: Secondary | ICD-10-CM | POA: Diagnosis not present

## 2018-01-08 DIAGNOSIS — N2581 Secondary hyperparathyroidism of renal origin: Secondary | ICD-10-CM | POA: Diagnosis not present

## 2018-01-08 DIAGNOSIS — D631 Anemia in chronic kidney disease: Secondary | ICD-10-CM | POA: Diagnosis not present

## 2018-01-08 DIAGNOSIS — N186 End stage renal disease: Secondary | ICD-10-CM | POA: Diagnosis not present

## 2018-01-11 DIAGNOSIS — Z23 Encounter for immunization: Secondary | ICD-10-CM | POA: Diagnosis not present

## 2018-01-11 DIAGNOSIS — D631 Anemia in chronic kidney disease: Secondary | ICD-10-CM | POA: Diagnosis not present

## 2018-01-11 DIAGNOSIS — N186 End stage renal disease: Secondary | ICD-10-CM | POA: Diagnosis not present

## 2018-01-11 DIAGNOSIS — Z4901 Encounter for fitting and adjustment of extracorporeal dialysis catheter: Secondary | ICD-10-CM | POA: Diagnosis not present

## 2018-01-11 DIAGNOSIS — E876 Hypokalemia: Secondary | ICD-10-CM | POA: Diagnosis not present

## 2018-01-11 DIAGNOSIS — N2581 Secondary hyperparathyroidism of renal origin: Secondary | ICD-10-CM | POA: Diagnosis not present

## 2018-01-11 DIAGNOSIS — D689 Coagulation defect, unspecified: Secondary | ICD-10-CM | POA: Diagnosis not present

## 2018-01-13 DIAGNOSIS — Z4901 Encounter for fitting and adjustment of extracorporeal dialysis catheter: Secondary | ICD-10-CM | POA: Diagnosis not present

## 2018-01-13 DIAGNOSIS — N186 End stage renal disease: Secondary | ICD-10-CM | POA: Diagnosis not present

## 2018-01-13 DIAGNOSIS — Z23 Encounter for immunization: Secondary | ICD-10-CM | POA: Diagnosis not present

## 2018-01-13 DIAGNOSIS — D631 Anemia in chronic kidney disease: Secondary | ICD-10-CM | POA: Diagnosis not present

## 2018-01-13 DIAGNOSIS — D689 Coagulation defect, unspecified: Secondary | ICD-10-CM | POA: Diagnosis not present

## 2018-01-13 DIAGNOSIS — E876 Hypokalemia: Secondary | ICD-10-CM | POA: Diagnosis not present

## 2018-01-13 DIAGNOSIS — N2581 Secondary hyperparathyroidism of renal origin: Secondary | ICD-10-CM | POA: Diagnosis not present

## 2018-01-15 DIAGNOSIS — I120 Hypertensive chronic kidney disease with stage 5 chronic kidney disease or end stage renal disease: Secondary | ICD-10-CM | POA: Diagnosis not present

## 2018-01-15 DIAGNOSIS — Z4901 Encounter for fitting and adjustment of extracorporeal dialysis catheter: Secondary | ICD-10-CM | POA: Diagnosis not present

## 2018-01-15 DIAGNOSIS — Z23 Encounter for immunization: Secondary | ICD-10-CM | POA: Diagnosis not present

## 2018-01-15 DIAGNOSIS — Z992 Dependence on renal dialysis: Secondary | ICD-10-CM | POA: Diagnosis not present

## 2018-01-15 DIAGNOSIS — D689 Coagulation defect, unspecified: Secondary | ICD-10-CM | POA: Diagnosis not present

## 2018-01-15 DIAGNOSIS — E876 Hypokalemia: Secondary | ICD-10-CM | POA: Diagnosis not present

## 2018-01-15 DIAGNOSIS — N186 End stage renal disease: Secondary | ICD-10-CM | POA: Diagnosis not present

## 2018-01-15 DIAGNOSIS — N2581 Secondary hyperparathyroidism of renal origin: Secondary | ICD-10-CM | POA: Diagnosis not present

## 2018-01-15 DIAGNOSIS — D631 Anemia in chronic kidney disease: Secondary | ICD-10-CM | POA: Diagnosis not present

## 2018-01-17 LAB — CUP PACEART REMOTE DEVICE CHECK
Brady Statistic AP VP Percent: 1 %
Brady Statistic AP VS Percent: 24 %
Brady Statistic AS VP Percent: 1 %
Brady Statistic RA Percent Paced: 24 %
Brady Statistic RV Percent Paced: 1 %
Date Time Interrogation Session: 20190724224328
Implantable Lead Implant Date: 20180719
Implantable Lead Location: 753859
Implantable Lead Location: 753860
Lead Channel Impedance Value: 380 Ohm
Lead Channel Impedance Value: 430 Ohm
Lead Channel Pacing Threshold Amplitude: 0.75 V
Lead Channel Pacing Threshold Amplitude: 0.75 V
Lead Channel Pacing Threshold Pulse Width: 0.5 ms
Lead Channel Sensing Intrinsic Amplitude: 11.3 mV
MDC IDC LEAD IMPLANT DT: 20180719
MDC IDC MSMT BATTERY REMAINING LONGEVITY: 114 mo
MDC IDC MSMT BATTERY REMAINING PERCENTAGE: 95.5 %
MDC IDC MSMT BATTERY VOLTAGE: 3.02 V
MDC IDC MSMT LEADCHNL RA SENSING INTR AMPL: 4.2 mV
MDC IDC MSMT LEADCHNL RV PACING THRESHOLD PULSEWIDTH: 0.5 ms
MDC IDC PG IMPLANT DT: 20180719
MDC IDC SET LEADCHNL RA PACING AMPLITUDE: 2 V
MDC IDC SET LEADCHNL RV PACING AMPLITUDE: 2.5 V
MDC IDC SET LEADCHNL RV PACING PULSEWIDTH: 0.5 ms
MDC IDC SET LEADCHNL RV SENSING SENSITIVITY: 2 mV
MDC IDC STAT BRADY AS VS PERCENT: 76 %
Pulse Gen Model: 2272
Pulse Gen Serial Number: 8918511

## 2018-01-18 DIAGNOSIS — N186 End stage renal disease: Secondary | ICD-10-CM | POA: Diagnosis not present

## 2018-01-18 DIAGNOSIS — E876 Hypokalemia: Secondary | ICD-10-CM | POA: Diagnosis not present

## 2018-01-18 DIAGNOSIS — N2581 Secondary hyperparathyroidism of renal origin: Secondary | ICD-10-CM | POA: Diagnosis not present

## 2018-01-18 DIAGNOSIS — D631 Anemia in chronic kidney disease: Secondary | ICD-10-CM | POA: Diagnosis not present

## 2018-01-18 DIAGNOSIS — Z4901 Encounter for fitting and adjustment of extracorporeal dialysis catheter: Secondary | ICD-10-CM | POA: Diagnosis not present

## 2018-01-18 DIAGNOSIS — Z23 Encounter for immunization: Secondary | ICD-10-CM | POA: Diagnosis not present

## 2018-01-18 DIAGNOSIS — D689 Coagulation defect, unspecified: Secondary | ICD-10-CM | POA: Diagnosis not present

## 2018-01-20 DIAGNOSIS — D631 Anemia in chronic kidney disease: Secondary | ICD-10-CM | POA: Diagnosis not present

## 2018-01-20 DIAGNOSIS — Z4901 Encounter for fitting and adjustment of extracorporeal dialysis catheter: Secondary | ICD-10-CM | POA: Diagnosis not present

## 2018-01-20 DIAGNOSIS — Z23 Encounter for immunization: Secondary | ICD-10-CM | POA: Diagnosis not present

## 2018-01-20 DIAGNOSIS — N2581 Secondary hyperparathyroidism of renal origin: Secondary | ICD-10-CM | POA: Diagnosis not present

## 2018-01-20 DIAGNOSIS — D689 Coagulation defect, unspecified: Secondary | ICD-10-CM | POA: Diagnosis not present

## 2018-01-20 DIAGNOSIS — E876 Hypokalemia: Secondary | ICD-10-CM | POA: Diagnosis not present

## 2018-01-20 DIAGNOSIS — N186 End stage renal disease: Secondary | ICD-10-CM | POA: Diagnosis not present

## 2018-01-21 DIAGNOSIS — Z452 Encounter for adjustment and management of vascular access device: Secondary | ICD-10-CM | POA: Diagnosis not present

## 2018-01-22 DIAGNOSIS — Z23 Encounter for immunization: Secondary | ICD-10-CM | POA: Diagnosis not present

## 2018-01-22 DIAGNOSIS — Z4901 Encounter for fitting and adjustment of extracorporeal dialysis catheter: Secondary | ICD-10-CM | POA: Diagnosis not present

## 2018-01-22 DIAGNOSIS — D689 Coagulation defect, unspecified: Secondary | ICD-10-CM | POA: Diagnosis not present

## 2018-01-22 DIAGNOSIS — E876 Hypokalemia: Secondary | ICD-10-CM | POA: Diagnosis not present

## 2018-01-22 DIAGNOSIS — N186 End stage renal disease: Secondary | ICD-10-CM | POA: Diagnosis not present

## 2018-01-22 DIAGNOSIS — N2581 Secondary hyperparathyroidism of renal origin: Secondary | ICD-10-CM | POA: Diagnosis not present

## 2018-01-22 DIAGNOSIS — D631 Anemia in chronic kidney disease: Secondary | ICD-10-CM | POA: Diagnosis not present

## 2018-01-25 DIAGNOSIS — E876 Hypokalemia: Secondary | ICD-10-CM | POA: Diagnosis not present

## 2018-01-25 DIAGNOSIS — N186 End stage renal disease: Secondary | ICD-10-CM | POA: Diagnosis not present

## 2018-01-25 DIAGNOSIS — Z23 Encounter for immunization: Secondary | ICD-10-CM | POA: Diagnosis not present

## 2018-01-25 DIAGNOSIS — D631 Anemia in chronic kidney disease: Secondary | ICD-10-CM | POA: Diagnosis not present

## 2018-01-25 DIAGNOSIS — D689 Coagulation defect, unspecified: Secondary | ICD-10-CM | POA: Diagnosis not present

## 2018-01-25 DIAGNOSIS — N2581 Secondary hyperparathyroidism of renal origin: Secondary | ICD-10-CM | POA: Diagnosis not present

## 2018-01-25 DIAGNOSIS — Z4901 Encounter for fitting and adjustment of extracorporeal dialysis catheter: Secondary | ICD-10-CM | POA: Diagnosis not present

## 2018-01-27 DIAGNOSIS — E876 Hypokalemia: Secondary | ICD-10-CM | POA: Diagnosis not present

## 2018-01-27 DIAGNOSIS — Z4901 Encounter for fitting and adjustment of extracorporeal dialysis catheter: Secondary | ICD-10-CM | POA: Diagnosis not present

## 2018-01-27 DIAGNOSIS — Z23 Encounter for immunization: Secondary | ICD-10-CM | POA: Diagnosis not present

## 2018-01-27 DIAGNOSIS — N2581 Secondary hyperparathyroidism of renal origin: Secondary | ICD-10-CM | POA: Diagnosis not present

## 2018-01-27 DIAGNOSIS — D631 Anemia in chronic kidney disease: Secondary | ICD-10-CM | POA: Diagnosis not present

## 2018-01-27 DIAGNOSIS — N186 End stage renal disease: Secondary | ICD-10-CM | POA: Diagnosis not present

## 2018-01-27 DIAGNOSIS — D689 Coagulation defect, unspecified: Secondary | ICD-10-CM | POA: Diagnosis not present

## 2018-01-29 DIAGNOSIS — Z4901 Encounter for fitting and adjustment of extracorporeal dialysis catheter: Secondary | ICD-10-CM | POA: Diagnosis not present

## 2018-01-29 DIAGNOSIS — N186 End stage renal disease: Secondary | ICD-10-CM | POA: Diagnosis not present

## 2018-01-29 DIAGNOSIS — Z23 Encounter for immunization: Secondary | ICD-10-CM | POA: Diagnosis not present

## 2018-01-29 DIAGNOSIS — E876 Hypokalemia: Secondary | ICD-10-CM | POA: Diagnosis not present

## 2018-01-29 DIAGNOSIS — D631 Anemia in chronic kidney disease: Secondary | ICD-10-CM | POA: Diagnosis not present

## 2018-01-29 DIAGNOSIS — D689 Coagulation defect, unspecified: Secondary | ICD-10-CM | POA: Diagnosis not present

## 2018-01-29 DIAGNOSIS — N2581 Secondary hyperparathyroidism of renal origin: Secondary | ICD-10-CM | POA: Diagnosis not present

## 2018-02-01 DIAGNOSIS — N186 End stage renal disease: Secondary | ICD-10-CM | POA: Diagnosis not present

## 2018-02-01 DIAGNOSIS — N2581 Secondary hyperparathyroidism of renal origin: Secondary | ICD-10-CM | POA: Diagnosis not present

## 2018-02-01 DIAGNOSIS — D631 Anemia in chronic kidney disease: Secondary | ICD-10-CM | POA: Diagnosis not present

## 2018-02-01 DIAGNOSIS — Z23 Encounter for immunization: Secondary | ICD-10-CM | POA: Diagnosis not present

## 2018-02-01 DIAGNOSIS — E876 Hypokalemia: Secondary | ICD-10-CM | POA: Diagnosis not present

## 2018-02-01 DIAGNOSIS — D689 Coagulation defect, unspecified: Secondary | ICD-10-CM | POA: Diagnosis not present

## 2018-02-01 DIAGNOSIS — Z4901 Encounter for fitting and adjustment of extracorporeal dialysis catheter: Secondary | ICD-10-CM | POA: Diagnosis not present

## 2018-02-03 DIAGNOSIS — Z23 Encounter for immunization: Secondary | ICD-10-CM | POA: Diagnosis not present

## 2018-02-03 DIAGNOSIS — D689 Coagulation defect, unspecified: Secondary | ICD-10-CM | POA: Diagnosis not present

## 2018-02-03 DIAGNOSIS — D631 Anemia in chronic kidney disease: Secondary | ICD-10-CM | POA: Diagnosis not present

## 2018-02-03 DIAGNOSIS — Z4901 Encounter for fitting and adjustment of extracorporeal dialysis catheter: Secondary | ICD-10-CM | POA: Diagnosis not present

## 2018-02-03 DIAGNOSIS — E876 Hypokalemia: Secondary | ICD-10-CM | POA: Diagnosis not present

## 2018-02-03 DIAGNOSIS — N186 End stage renal disease: Secondary | ICD-10-CM | POA: Diagnosis not present

## 2018-02-03 DIAGNOSIS — N2581 Secondary hyperparathyroidism of renal origin: Secondary | ICD-10-CM | POA: Diagnosis not present

## 2018-02-04 ENCOUNTER — Ambulatory Visit (INDEPENDENT_AMBULATORY_CARE_PROVIDER_SITE_OTHER): Payer: Medicare Other | Admitting: Pharmacist Clinician (PhC)/ Clinical Pharmacy Specialist

## 2018-02-04 DIAGNOSIS — I4891 Unspecified atrial fibrillation: Secondary | ICD-10-CM | POA: Diagnosis not present

## 2018-02-04 DIAGNOSIS — Z7901 Long term (current) use of anticoagulants: Secondary | ICD-10-CM | POA: Diagnosis not present

## 2018-02-04 LAB — POCT INR: INR: 2.2 (ref 2.0–3.0)

## 2018-02-05 DIAGNOSIS — D631 Anemia in chronic kidney disease: Secondary | ICD-10-CM | POA: Diagnosis not present

## 2018-02-05 DIAGNOSIS — Z23 Encounter for immunization: Secondary | ICD-10-CM | POA: Diagnosis not present

## 2018-02-05 DIAGNOSIS — D689 Coagulation defect, unspecified: Secondary | ICD-10-CM | POA: Diagnosis not present

## 2018-02-05 DIAGNOSIS — N2581 Secondary hyperparathyroidism of renal origin: Secondary | ICD-10-CM | POA: Diagnosis not present

## 2018-02-05 DIAGNOSIS — N186 End stage renal disease: Secondary | ICD-10-CM | POA: Diagnosis not present

## 2018-02-05 DIAGNOSIS — E876 Hypokalemia: Secondary | ICD-10-CM | POA: Diagnosis not present

## 2018-02-05 DIAGNOSIS — Z4901 Encounter for fitting and adjustment of extracorporeal dialysis catheter: Secondary | ICD-10-CM | POA: Diagnosis not present

## 2018-02-06 ENCOUNTER — Encounter (HOSPITAL_COMMUNITY): Payer: Self-pay | Admitting: Emergency Medicine

## 2018-02-06 ENCOUNTER — Other Ambulatory Visit: Payer: Self-pay

## 2018-02-06 ENCOUNTER — Emergency Department (HOSPITAL_COMMUNITY)
Admission: EM | Admit: 2018-02-06 | Discharge: 2018-02-07 | Disposition: A | Discharge status: Home / Self Care | Payer: Medicare Other | Attending: Emergency Medicine | Admitting: Emergency Medicine

## 2018-02-06 DIAGNOSIS — Z5321 Procedure and treatment not carried out due to patient leaving prior to being seen by health care provider: Secondary | ICD-10-CM | POA: Insufficient documentation

## 2018-02-06 DIAGNOSIS — M546 Pain in thoracic spine: Secondary | ICD-10-CM | POA: Insufficient documentation

## 2018-02-06 LAB — BASIC METABOLIC PANEL
ANION GAP: 14 (ref 5–15)
BUN: 20 mg/dL (ref 8–23)
CALCIUM: 8.7 mg/dL — AB (ref 8.9–10.3)
CO2: 26 mmol/L (ref 22–32)
Chloride: 94 mmol/L — ABNORMAL LOW (ref 98–111)
Creatinine, Ser: 4.43 mg/dL — ABNORMAL HIGH (ref 0.44–1.00)
GFR calc Af Amer: 10 mL/min — ABNORMAL LOW (ref >60)
GFR, EST NON AFRICAN AMERICAN: 8 mL/min — AB (ref >60)
GLUCOSE: 103 mg/dL — AB (ref 70–99)
POTASSIUM: 4.5 mmol/L (ref 3.5–5.1)
Sodium: 134 mmol/L — ABNORMAL LOW (ref 135–145)

## 2018-02-06 LAB — CBC
HEMATOCRIT: 38.3 % (ref 36.0–46.0)
HEMOGLOBIN: 11.9 g/dL — AB (ref 12.0–15.0)
MCH: 31.8 pg (ref 26.0–34.0)
MCHC: 31.1 g/dL (ref 30.0–36.0)
MCV: 102.4 fL — ABNORMAL HIGH (ref 78.0–100.0)
Platelets: 154 10*3/uL (ref 150–400)
RBC: 3.74 MIL/uL — AB (ref 3.87–5.11)
RDW: 14.3 % (ref 11.5–15.5)
WBC: 6.6 10*3/uL (ref 4.0–10.5)

## 2018-02-06 LAB — I-STAT TROPONIN, ED: TROPONIN I, POC: 0.02 ng/mL (ref 0.00–0.08)

## 2018-02-06 NOTE — ED Triage Notes (Signed)
C/o intermittent stabbing pain to thoracic back that started this evening.  Took Vicodin pta and states pain decreased to 6/10.  Denies any other complaints other than pain.  No known injury.

## 2018-02-07 NOTE — ED Notes (Signed)
Patient stated that she did not want to wait any longer and that she would follow up with her primary care doctor. Patient was encouraged to stay and be seen by one of our medical providers however patient insisted on leaving.

## 2018-02-08 DIAGNOSIS — Z4901 Encounter for fitting and adjustment of extracorporeal dialysis catheter: Secondary | ICD-10-CM | POA: Diagnosis not present

## 2018-02-08 DIAGNOSIS — D689 Coagulation defect, unspecified: Secondary | ICD-10-CM | POA: Diagnosis not present

## 2018-02-08 DIAGNOSIS — E876 Hypokalemia: Secondary | ICD-10-CM | POA: Diagnosis not present

## 2018-02-08 DIAGNOSIS — D631 Anemia in chronic kidney disease: Secondary | ICD-10-CM | POA: Diagnosis not present

## 2018-02-08 DIAGNOSIS — Z23 Encounter for immunization: Secondary | ICD-10-CM | POA: Diagnosis not present

## 2018-02-08 DIAGNOSIS — N186 End stage renal disease: Secondary | ICD-10-CM | POA: Diagnosis not present

## 2018-02-08 DIAGNOSIS — N2581 Secondary hyperparathyroidism of renal origin: Secondary | ICD-10-CM | POA: Diagnosis not present

## 2018-02-09 DIAGNOSIS — R52 Pain, unspecified: Secondary | ICD-10-CM | POA: Diagnosis not present

## 2018-02-09 DIAGNOSIS — M4854XA Collapsed vertebra, not elsewhere classified, thoracic region, initial encounter for fracture: Secondary | ICD-10-CM | POA: Diagnosis not present

## 2018-02-09 DIAGNOSIS — M5134 Other intervertebral disc degeneration, thoracic region: Secondary | ICD-10-CM | POA: Diagnosis not present

## 2018-02-10 DIAGNOSIS — E876 Hypokalemia: Secondary | ICD-10-CM | POA: Diagnosis not present

## 2018-02-10 DIAGNOSIS — D689 Coagulation defect, unspecified: Secondary | ICD-10-CM | POA: Diagnosis not present

## 2018-02-10 DIAGNOSIS — D631 Anemia in chronic kidney disease: Secondary | ICD-10-CM | POA: Diagnosis not present

## 2018-02-10 DIAGNOSIS — Z23 Encounter for immunization: Secondary | ICD-10-CM | POA: Diagnosis not present

## 2018-02-10 DIAGNOSIS — Z4901 Encounter for fitting and adjustment of extracorporeal dialysis catheter: Secondary | ICD-10-CM | POA: Diagnosis not present

## 2018-02-10 DIAGNOSIS — N2581 Secondary hyperparathyroidism of renal origin: Secondary | ICD-10-CM | POA: Diagnosis not present

## 2018-02-10 DIAGNOSIS — N186 End stage renal disease: Secondary | ICD-10-CM | POA: Diagnosis not present

## 2018-02-11 ENCOUNTER — Other Ambulatory Visit: Payer: Self-pay | Admitting: Chiropractic Medicine

## 2018-02-11 DIAGNOSIS — M4854XA Collapsed vertebra, not elsewhere classified, thoracic region, initial encounter for fracture: Secondary | ICD-10-CM

## 2018-02-12 DIAGNOSIS — N2581 Secondary hyperparathyroidism of renal origin: Secondary | ICD-10-CM | POA: Diagnosis not present

## 2018-02-12 DIAGNOSIS — Z23 Encounter for immunization: Secondary | ICD-10-CM | POA: Diagnosis not present

## 2018-02-12 DIAGNOSIS — Z4901 Encounter for fitting and adjustment of extracorporeal dialysis catheter: Secondary | ICD-10-CM | POA: Diagnosis not present

## 2018-02-12 DIAGNOSIS — D631 Anemia in chronic kidney disease: Secondary | ICD-10-CM | POA: Diagnosis not present

## 2018-02-12 DIAGNOSIS — N186 End stage renal disease: Secondary | ICD-10-CM | POA: Diagnosis not present

## 2018-02-12 DIAGNOSIS — D689 Coagulation defect, unspecified: Secondary | ICD-10-CM | POA: Diagnosis not present

## 2018-02-12 DIAGNOSIS — E876 Hypokalemia: Secondary | ICD-10-CM | POA: Diagnosis not present

## 2018-02-14 ENCOUNTER — Ambulatory Visit
Admission: RE | Admit: 2018-02-14 | Discharge: 2018-02-14 | Disposition: A | Discharge status: Home / Self Care | Payer: Medicare Other | Source: Ambulatory Visit | Attending: Chiropractic Medicine | Admitting: Chiropractic Medicine

## 2018-02-14 DIAGNOSIS — N186 End stage renal disease: Secondary | ICD-10-CM | POA: Diagnosis not present

## 2018-02-14 DIAGNOSIS — S22030A Wedge compression fracture of third thoracic vertebra, initial encounter for closed fracture: Secondary | ICD-10-CM | POA: Diagnosis not present

## 2018-02-14 DIAGNOSIS — M4854XA Collapsed vertebra, not elsewhere classified, thoracic region, initial encounter for fracture: Secondary | ICD-10-CM

## 2018-02-14 DIAGNOSIS — I120 Hypertensive chronic kidney disease with stage 5 chronic kidney disease or end stage renal disease: Secondary | ICD-10-CM | POA: Diagnosis not present

## 2018-02-14 DIAGNOSIS — Z992 Dependence on renal dialysis: Secondary | ICD-10-CM | POA: Diagnosis not present

## 2018-02-15 ENCOUNTER — Encounter: Payer: Self-pay | Admitting: Family Medicine

## 2018-02-15 DIAGNOSIS — D689 Coagulation defect, unspecified: Secondary | ICD-10-CM | POA: Diagnosis not present

## 2018-02-15 DIAGNOSIS — D631 Anemia in chronic kidney disease: Secondary | ICD-10-CM | POA: Diagnosis not present

## 2018-02-15 DIAGNOSIS — E876 Hypokalemia: Secondary | ICD-10-CM | POA: Diagnosis not present

## 2018-02-15 DIAGNOSIS — N2581 Secondary hyperparathyroidism of renal origin: Secondary | ICD-10-CM | POA: Diagnosis not present

## 2018-02-15 DIAGNOSIS — N186 End stage renal disease: Secondary | ICD-10-CM | POA: Diagnosis not present

## 2018-02-17 DIAGNOSIS — N186 End stage renal disease: Secondary | ICD-10-CM | POA: Diagnosis not present

## 2018-02-17 DIAGNOSIS — E876 Hypokalemia: Secondary | ICD-10-CM | POA: Diagnosis not present

## 2018-02-17 DIAGNOSIS — D689 Coagulation defect, unspecified: Secondary | ICD-10-CM | POA: Diagnosis not present

## 2018-02-17 DIAGNOSIS — N2581 Secondary hyperparathyroidism of renal origin: Secondary | ICD-10-CM | POA: Diagnosis not present

## 2018-02-17 DIAGNOSIS — D631 Anemia in chronic kidney disease: Secondary | ICD-10-CM | POA: Diagnosis not present

## 2018-02-19 DIAGNOSIS — D631 Anemia in chronic kidney disease: Secondary | ICD-10-CM | POA: Diagnosis not present

## 2018-02-19 DIAGNOSIS — D689 Coagulation defect, unspecified: Secondary | ICD-10-CM | POA: Diagnosis not present

## 2018-02-19 DIAGNOSIS — N2581 Secondary hyperparathyroidism of renal origin: Secondary | ICD-10-CM | POA: Diagnosis not present

## 2018-02-19 DIAGNOSIS — E876 Hypokalemia: Secondary | ICD-10-CM | POA: Diagnosis not present

## 2018-02-19 DIAGNOSIS — N186 End stage renal disease: Secondary | ICD-10-CM | POA: Diagnosis not present

## 2018-02-22 DIAGNOSIS — E876 Hypokalemia: Secondary | ICD-10-CM | POA: Diagnosis not present

## 2018-02-22 DIAGNOSIS — N2581 Secondary hyperparathyroidism of renal origin: Secondary | ICD-10-CM | POA: Diagnosis not present

## 2018-02-22 DIAGNOSIS — D631 Anemia in chronic kidney disease: Secondary | ICD-10-CM | POA: Diagnosis not present

## 2018-02-22 DIAGNOSIS — N186 End stage renal disease: Secondary | ICD-10-CM | POA: Diagnosis not present

## 2018-02-22 DIAGNOSIS — D689 Coagulation defect, unspecified: Secondary | ICD-10-CM | POA: Diagnosis not present

## 2018-02-23 DIAGNOSIS — M48061 Spinal stenosis, lumbar region without neurogenic claudication: Secondary | ICD-10-CM | POA: Diagnosis not present

## 2018-02-24 DIAGNOSIS — N186 End stage renal disease: Secondary | ICD-10-CM | POA: Diagnosis not present

## 2018-02-24 DIAGNOSIS — D631 Anemia in chronic kidney disease: Secondary | ICD-10-CM | POA: Diagnosis not present

## 2018-02-24 DIAGNOSIS — D689 Coagulation defect, unspecified: Secondary | ICD-10-CM | POA: Diagnosis not present

## 2018-02-24 DIAGNOSIS — N2581 Secondary hyperparathyroidism of renal origin: Secondary | ICD-10-CM | POA: Diagnosis not present

## 2018-02-24 DIAGNOSIS — E876 Hypokalemia: Secondary | ICD-10-CM | POA: Diagnosis not present

## 2018-02-26 DIAGNOSIS — N2581 Secondary hyperparathyroidism of renal origin: Secondary | ICD-10-CM | POA: Diagnosis not present

## 2018-02-26 DIAGNOSIS — E876 Hypokalemia: Secondary | ICD-10-CM | POA: Diagnosis not present

## 2018-02-26 DIAGNOSIS — D689 Coagulation defect, unspecified: Secondary | ICD-10-CM | POA: Diagnosis not present

## 2018-02-26 DIAGNOSIS — N186 End stage renal disease: Secondary | ICD-10-CM | POA: Diagnosis not present

## 2018-02-26 DIAGNOSIS — D631 Anemia in chronic kidney disease: Secondary | ICD-10-CM | POA: Diagnosis not present

## 2018-03-01 DIAGNOSIS — E876 Hypokalemia: Secondary | ICD-10-CM | POA: Diagnosis not present

## 2018-03-01 DIAGNOSIS — N186 End stage renal disease: Secondary | ICD-10-CM | POA: Diagnosis not present

## 2018-03-01 DIAGNOSIS — N2581 Secondary hyperparathyroidism of renal origin: Secondary | ICD-10-CM | POA: Diagnosis not present

## 2018-03-01 DIAGNOSIS — D689 Coagulation defect, unspecified: Secondary | ICD-10-CM | POA: Diagnosis not present

## 2018-03-01 DIAGNOSIS — D631 Anemia in chronic kidney disease: Secondary | ICD-10-CM | POA: Diagnosis not present

## 2018-03-02 ENCOUNTER — Ambulatory Visit (INDEPENDENT_AMBULATORY_CARE_PROVIDER_SITE_OTHER): Payer: Medicare Other | Admitting: Pharmacist

## 2018-03-02 DIAGNOSIS — Z7901 Long term (current) use of anticoagulants: Secondary | ICD-10-CM | POA: Diagnosis not present

## 2018-03-02 DIAGNOSIS — I4891 Unspecified atrial fibrillation: Secondary | ICD-10-CM | POA: Diagnosis not present

## 2018-03-02 LAB — POCT INR: INR: 1.3 — AB (ref 2.0–3.0)

## 2018-03-03 DIAGNOSIS — N2581 Secondary hyperparathyroidism of renal origin: Secondary | ICD-10-CM | POA: Diagnosis not present

## 2018-03-03 DIAGNOSIS — N186 End stage renal disease: Secondary | ICD-10-CM | POA: Diagnosis not present

## 2018-03-03 DIAGNOSIS — D689 Coagulation defect, unspecified: Secondary | ICD-10-CM | POA: Diagnosis not present

## 2018-03-03 DIAGNOSIS — E876 Hypokalemia: Secondary | ICD-10-CM | POA: Diagnosis not present

## 2018-03-03 DIAGNOSIS — D631 Anemia in chronic kidney disease: Secondary | ICD-10-CM | POA: Diagnosis not present

## 2018-03-05 DIAGNOSIS — N2581 Secondary hyperparathyroidism of renal origin: Secondary | ICD-10-CM | POA: Diagnosis not present

## 2018-03-05 DIAGNOSIS — D689 Coagulation defect, unspecified: Secondary | ICD-10-CM | POA: Diagnosis not present

## 2018-03-05 DIAGNOSIS — N186 End stage renal disease: Secondary | ICD-10-CM | POA: Diagnosis not present

## 2018-03-05 DIAGNOSIS — E876 Hypokalemia: Secondary | ICD-10-CM | POA: Diagnosis not present

## 2018-03-05 DIAGNOSIS — D631 Anemia in chronic kidney disease: Secondary | ICD-10-CM | POA: Diagnosis not present

## 2018-03-07 ENCOUNTER — Encounter: Payer: Self-pay | Admitting: Family Medicine

## 2018-03-08 DIAGNOSIS — D689 Coagulation defect, unspecified: Secondary | ICD-10-CM | POA: Diagnosis not present

## 2018-03-08 DIAGNOSIS — D631 Anemia in chronic kidney disease: Secondary | ICD-10-CM | POA: Diagnosis not present

## 2018-03-08 DIAGNOSIS — N186 End stage renal disease: Secondary | ICD-10-CM | POA: Diagnosis not present

## 2018-03-08 DIAGNOSIS — N2581 Secondary hyperparathyroidism of renal origin: Secondary | ICD-10-CM | POA: Diagnosis not present

## 2018-03-08 DIAGNOSIS — E876 Hypokalemia: Secondary | ICD-10-CM | POA: Diagnosis not present

## 2018-03-09 ENCOUNTER — Telehealth: Payer: Self-pay | Admitting: Internal Medicine

## 2018-03-09 ENCOUNTER — Ambulatory Visit (INDEPENDENT_AMBULATORY_CARE_PROVIDER_SITE_OTHER): Payer: Medicare Other | Admitting: *Deleted

## 2018-03-09 ENCOUNTER — Telehealth: Payer: Self-pay

## 2018-03-09 ENCOUNTER — Other Ambulatory Visit: Payer: Self-pay | Admitting: Family Medicine

## 2018-03-09 DIAGNOSIS — I495 Sick sinus syndrome: Secondary | ICD-10-CM

## 2018-03-09 DIAGNOSIS — I10 Essential (primary) hypertension: Secondary | ICD-10-CM

## 2018-03-09 NOTE — Progress Notes (Signed)
Remote pacemaker transmission.   

## 2018-03-09 NOTE — Telephone Encounter (Signed)
LMOVM reminding pt to send remote transmission.   

## 2018-03-09 NOTE — Telephone Encounter (Signed)
Transmission received.

## 2018-03-09 NOTE — Telephone Encounter (Signed)
New Message          Patient's daughter is returning the call and would like a call back.

## 2018-03-09 NOTE — Telephone Encounter (Signed)
No answer on pts home phone, left LVM with pts daughter regarding remote pacemaker transmission that is due today.

## 2018-03-10 DIAGNOSIS — N2581 Secondary hyperparathyroidism of renal origin: Secondary | ICD-10-CM | POA: Diagnosis not present

## 2018-03-10 DIAGNOSIS — D689 Coagulation defect, unspecified: Secondary | ICD-10-CM | POA: Diagnosis not present

## 2018-03-10 DIAGNOSIS — D631 Anemia in chronic kidney disease: Secondary | ICD-10-CM | POA: Diagnosis not present

## 2018-03-10 DIAGNOSIS — N186 End stage renal disease: Secondary | ICD-10-CM | POA: Diagnosis not present

## 2018-03-10 DIAGNOSIS — E876 Hypokalemia: Secondary | ICD-10-CM | POA: Diagnosis not present

## 2018-03-12 DIAGNOSIS — E876 Hypokalemia: Secondary | ICD-10-CM | POA: Diagnosis not present

## 2018-03-12 DIAGNOSIS — D631 Anemia in chronic kidney disease: Secondary | ICD-10-CM | POA: Diagnosis not present

## 2018-03-12 DIAGNOSIS — N186 End stage renal disease: Secondary | ICD-10-CM | POA: Diagnosis not present

## 2018-03-12 DIAGNOSIS — N2581 Secondary hyperparathyroidism of renal origin: Secondary | ICD-10-CM | POA: Diagnosis not present

## 2018-03-12 DIAGNOSIS — D689 Coagulation defect, unspecified: Secondary | ICD-10-CM | POA: Diagnosis not present

## 2018-03-15 DIAGNOSIS — D631 Anemia in chronic kidney disease: Secondary | ICD-10-CM | POA: Diagnosis not present

## 2018-03-15 DIAGNOSIS — E876 Hypokalemia: Secondary | ICD-10-CM | POA: Diagnosis not present

## 2018-03-15 DIAGNOSIS — N186 End stage renal disease: Secondary | ICD-10-CM | POA: Diagnosis not present

## 2018-03-15 DIAGNOSIS — N2581 Secondary hyperparathyroidism of renal origin: Secondary | ICD-10-CM | POA: Diagnosis not present

## 2018-03-15 DIAGNOSIS — D689 Coagulation defect, unspecified: Secondary | ICD-10-CM | POA: Diagnosis not present

## 2018-03-16 ENCOUNTER — Ambulatory Visit (INDEPENDENT_AMBULATORY_CARE_PROVIDER_SITE_OTHER): Payer: Medicare Other | Admitting: Pharmacist Clinician (PhC)/ Clinical Pharmacy Specialist

## 2018-03-16 DIAGNOSIS — Z7901 Long term (current) use of anticoagulants: Secondary | ICD-10-CM

## 2018-03-16 DIAGNOSIS — I4891 Unspecified atrial fibrillation: Secondary | ICD-10-CM

## 2018-03-16 LAB — POCT INR: INR: 1.4 — AB (ref 2.0–3.0)

## 2018-03-17 DIAGNOSIS — Z992 Dependence on renal dialysis: Secondary | ICD-10-CM | POA: Diagnosis not present

## 2018-03-17 DIAGNOSIS — N186 End stage renal disease: Secondary | ICD-10-CM | POA: Diagnosis not present

## 2018-03-17 DIAGNOSIS — D689 Coagulation defect, unspecified: Secondary | ICD-10-CM | POA: Diagnosis not present

## 2018-03-17 DIAGNOSIS — I120 Hypertensive chronic kidney disease with stage 5 chronic kidney disease or end stage renal disease: Secondary | ICD-10-CM | POA: Diagnosis not present

## 2018-03-17 DIAGNOSIS — D631 Anemia in chronic kidney disease: Secondary | ICD-10-CM | POA: Diagnosis not present

## 2018-03-17 DIAGNOSIS — E876 Hypokalemia: Secondary | ICD-10-CM | POA: Diagnosis not present

## 2018-03-17 DIAGNOSIS — N2581 Secondary hyperparathyroidism of renal origin: Secondary | ICD-10-CM | POA: Diagnosis not present

## 2018-03-19 DIAGNOSIS — N2581 Secondary hyperparathyroidism of renal origin: Secondary | ICD-10-CM | POA: Diagnosis not present

## 2018-03-19 DIAGNOSIS — D631 Anemia in chronic kidney disease: Secondary | ICD-10-CM | POA: Diagnosis not present

## 2018-03-19 DIAGNOSIS — N186 End stage renal disease: Secondary | ICD-10-CM | POA: Diagnosis not present

## 2018-03-19 DIAGNOSIS — E876 Hypokalemia: Secondary | ICD-10-CM | POA: Diagnosis not present

## 2018-03-19 DIAGNOSIS — D689 Coagulation defect, unspecified: Secondary | ICD-10-CM | POA: Diagnosis not present

## 2018-03-22 DIAGNOSIS — D631 Anemia in chronic kidney disease: Secondary | ICD-10-CM | POA: Diagnosis not present

## 2018-03-22 DIAGNOSIS — N186 End stage renal disease: Secondary | ICD-10-CM | POA: Diagnosis not present

## 2018-03-22 DIAGNOSIS — D689 Coagulation defect, unspecified: Secondary | ICD-10-CM | POA: Diagnosis not present

## 2018-03-22 DIAGNOSIS — N2581 Secondary hyperparathyroidism of renal origin: Secondary | ICD-10-CM | POA: Diagnosis not present

## 2018-03-22 DIAGNOSIS — E876 Hypokalemia: Secondary | ICD-10-CM | POA: Diagnosis not present

## 2018-03-24 DIAGNOSIS — D631 Anemia in chronic kidney disease: Secondary | ICD-10-CM | POA: Diagnosis not present

## 2018-03-24 DIAGNOSIS — E876 Hypokalemia: Secondary | ICD-10-CM | POA: Diagnosis not present

## 2018-03-24 DIAGNOSIS — N186 End stage renal disease: Secondary | ICD-10-CM | POA: Diagnosis not present

## 2018-03-24 DIAGNOSIS — D689 Coagulation defect, unspecified: Secondary | ICD-10-CM | POA: Diagnosis not present

## 2018-03-24 DIAGNOSIS — N2581 Secondary hyperparathyroidism of renal origin: Secondary | ICD-10-CM | POA: Diagnosis not present

## 2018-03-26 DIAGNOSIS — N2581 Secondary hyperparathyroidism of renal origin: Secondary | ICD-10-CM | POA: Diagnosis not present

## 2018-03-26 DIAGNOSIS — E876 Hypokalemia: Secondary | ICD-10-CM | POA: Diagnosis not present

## 2018-03-26 DIAGNOSIS — N186 End stage renal disease: Secondary | ICD-10-CM | POA: Diagnosis not present

## 2018-03-26 DIAGNOSIS — D631 Anemia in chronic kidney disease: Secondary | ICD-10-CM | POA: Diagnosis not present

## 2018-03-26 DIAGNOSIS — D689 Coagulation defect, unspecified: Secondary | ICD-10-CM | POA: Diagnosis not present

## 2018-03-28 ENCOUNTER — Ambulatory Visit (INDEPENDENT_AMBULATORY_CARE_PROVIDER_SITE_OTHER): Payer: Medicare Other | Admitting: Family Medicine

## 2018-03-28 ENCOUNTER — Encounter: Payer: Self-pay | Admitting: Family Medicine

## 2018-03-28 ENCOUNTER — Encounter: Payer: Self-pay | Admitting: *Deleted

## 2018-03-28 VITALS — BP 133/71 | HR 72 | Temp 97.5°F | Resp 16 | Ht <= 58 in | Wt 122.0 lb

## 2018-03-28 DIAGNOSIS — Z992 Dependence on renal dialysis: Secondary | ICD-10-CM

## 2018-03-28 DIAGNOSIS — G8929 Other chronic pain: Secondary | ICD-10-CM | POA: Diagnosis not present

## 2018-03-28 DIAGNOSIS — E7800 Pure hypercholesterolemia, unspecified: Secondary | ICD-10-CM | POA: Diagnosis not present

## 2018-03-28 DIAGNOSIS — Z0000 Encounter for general adult medical examination without abnormal findings: Secondary | ICD-10-CM | POA: Diagnosis not present

## 2018-03-28 DIAGNOSIS — M546 Pain in thoracic spine: Secondary | ICD-10-CM | POA: Diagnosis not present

## 2018-03-28 DIAGNOSIS — N186 End stage renal disease: Secondary | ICD-10-CM

## 2018-03-28 LAB — LIPID PANEL
CHOLESTEROL: 194 mg/dL (ref 0–200)
HDL: 62.1 mg/dL (ref >39.00)
NonHDL: 131.64
Total CHOL/HDL Ratio: 3
Triglycerides: 210 mg/dL — ABNORMAL HIGH (ref 0.0–149.0)
VLDL: 42 mg/dL — ABNORMAL HIGH (ref 0.0–40.0)

## 2018-03-28 LAB — LDL CHOLESTEROL, DIRECT: Direct LDL: 98 mg/dL

## 2018-03-28 MED ORDER — ZOSTER VAC RECOMB ADJUVANTED 50 MCG/0.5ML IM SUSR: 0.5000 mL | Freq: Once | INTRAMUSCULAR | 1 refills | Status: AC

## 2018-03-28 NOTE — Progress Notes (Signed)
Office Note 03/28/2018  CC:  Chief Complaint  Patient presents with  . Annual Exam    Pt is fasting.    HPI:  Valerie Terrell is a 84 y.o. White female who is here for annual health maintenance exam.  Feeling fine other than bothered a lot by chronic mid back pain.  Varies in location: sometimes to L of spine and sometimes to R of spine, but no radiation around flank or down or up her back.  This pain primarily occurs after being up and around standing or walking for a little while.  She rarely uses vicodin that I have rx'd her, usually takes tylenol.  Avoids NSAIDs. She does say she will likely want to continue getting rx'd the vicodin in the future but only wants to continue using it very sparingly.  We discussed controlled substance contract today.  No new vicodin rx needed today.  CT thoracic spine w/out contrast 02/14/18: IMPRESSION: 1.  No acute osseous abnormality in the thoracic spine. 2. Moderate to severe T3 compression fracture with mild retropulsion of bone is stable since July. Previously augmented T8 compression fracture and other mild thoracic endplate deformities remain stable. 3. Capacious spinal canal with chronic degenerative spinal stenosis at T7-T8, but mild if any acquired thoracic spinal stenosis elsewhere. 4. Aortic Atherosclerosis (ICD10-I70.0), cardiomegaly, left renal atrophy.  Past Medical History:  Diagnosis Date  . Anemia of chronic disease 2018   + anemia of CRI?  [name] Atrial fibrillation ([place]) [I48.91] 12/11/2016  . Atrophy of left kidney    with absent blood flow by renal artery dopplers (Dr. Gwenlyn Found)  . 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 Chronic combined systolic and diastolic CHF (congestive heart failure) ([place])    a. 11/2016: echo showing EF of 35-40%, RV strain noted, mild MR and mild TR.   Echo showed much improved EF 12/2017 (55-60%)  . Chronic respiratory failure with hypoxia ([place]) 02/11/2017   Now on chronic O2 (intermittent  as of summer 2019).    . Debilitated patient    WC dependent as of 2018  . ESRD on hemodialysis ([place]) 01/2017   T/Th/Sat schedule- [place].  Right basilic AV fistula [id number]--BMW unable to successfully access this.  Dr. Donnetta Hutching to do an upper arm AV graft as of 10/2017..  . History of adenomatous polyp of colon   . History of kidney stones    passed  . History of subarachnoid hemorrhage 10/2014   after syncope and while on xarelto  . HOH (hard of hearing)   . Hyperlipidemia   . Hypertension    Difficult to control, in the setting of one functioning kidney: pt was referred to nephrology by Dr. Gwenlyn Found 06/2015.  . Lumbar radiculopathy 2012  . Lumbar spinal stenosis 2019   facet injections only very short term relief.  As of 09/2017 ortho f/u, selective nerve root block planned.  . Lumbar spondylosis    MR 07/2016---no sign of spinal nerve compression or cord compression.  Pt set up with outpt ortho while admitted to hosp 07/2016.  . Malnourished ([place])   . Metatarsal fracture 06/10/2016   Nondisplaced, left 5th metatarsal--pt was referred to ortho  . Nonischemic cardiomyopathy ([place])    EF improved to 55-60% on echo 12/2017  . Osteopenia 2014   T-score -2.1  . PAF (paroxysmal atrial fibrillation) ([facility])    Eliquis started after BRAO and CVA.  She was changed to warfarin 2018--INR/coumadin mgmt per HD/nephrol.  . Presence of permanent  cardiac pacemaker   . Right rib fracture 12/2016   s/p fall  . Sick sinus syndrome ([place]) 11/2016   Dual chamber pacer insertion 2018 (Dr. Lovena Le)  . Stroke ([place]) [id number]/7169   cardioembolic (had CVA while on no anticoag)--"scattered subacute punctate infarcts: 1 in R parietal lobe and 2 in occipital cortex" on MRI br.  CT angio head/neck: aortic arch athero.  R ICA 20% stenosis, L ICA w/out any stenosis.  . Thoracic back pain 01/2017; 01/2018   2018: Facet?  Dr. Ramos->steroid injection.  01/2018-->CT T spine to check for a new comp fx-->none found.  Selective  nerve root inj in T spine helpful on R side.   . Thoracic compression fracture ([place]) 07/2016   T3.  T7 and T8-- T8 kyphoplasty during hosp admission 07/2016.  Neuro referred pt to pain mgmt for consideration of injection 09/2016.  I referred her to endo 08/2016 for consideration of calcitonin treatment.    Past Surgical History:  Procedure Laterality Date  . APPENDECTOMY  child  . AV FISTULA PLACEMENT Right 03/31/2017   Procedure: Right ARM Basilic vein transposition;  Surgeon: Conrad Convent, MD;  Location: [facility] OR;  Service: Vascular;  Laterality: Right;  . AV FISTULA PLACEMENT Right 12/06/2017   Procedure: INSERTION OF ARTERIOVENOUS (AV) GORE-TEX GRAFT ARM RIGHT UPPER EXTREMITY;  Surgeon: [name], MD;  Location: [facility];  Service: Vascular;  Laterality: Right;  . CARDIOVASCULAR STRESS TEST  01/31/2016   Stress myoview: NORMAL/Low risk.  EF 56%.  . [place]  . COLONOSCOPY  2015   + hx of adenomatous polyps.  Need digest health spec in [place] records to see when pt due for next colonoscopy  . EYE SURGERY Bilateral    Catarct  . INSERTION OF DIALYSIS CATHETER Right 01/29/2017   Procedure: INSERTION OF DIALYSIS CATHETER- RIGHT INTERNAL JUGULAR;  Surgeon: [name], MD;  Location: [facility] OR;  Service: Vascular;  Laterality: Right;  . IR GENERIC HISTORICAL  07/24/2016   IR KYPHO THORACIC WITH BONE BIOPSY 07/24/2016 Luanne Bras, MD [facility]-INTERV RAD  . KYPHOPLASTY  07/27/2016   T8  . PACEMAKER IMPLANT N/A 12/03/2016   Procedure: Pacemaker Implant;  Surgeon: Evans Lance, MD;  Location: [facility];  Service: Cardiovascular;  Laterality: N/A;  . Renal artery dopplers  02/26/2016   Her right renal dimension was 11 cm pole to pole with mild to moderate right renal artery stenosis. A right renal aortic ratio was 3.22 suggesting less than a 50% stenosis.  . Right upper arm AV Gore-Tex graft  12/06/2017   Dr. Donnetta Hutching  . TONSILLECTOMY    . TRANSTHORACIC ECHOCARDIOGRAM   01/29/2016; 11/2016; 12/2017   2017: EF 55-60%, normal LV wall motion, grade I DD.  No cardiac source of emboli was seen.  2018: EF 35-40%, could not assess DD due to a-fib, pulm HTN noted. 12/2017 EF 55-60%, nl wall motion, grd II DD, mod MR and pulm regurg, increased pulm pressure (peak 52).    Family History  Problem Relation Age of Onset  . Cancer Mother   . Heart disease Father   . Early death Father   . Sudden Cardiac Death Neg Hx     Social History   Socioeconomic History  . Marital status: Widowed    Spouse name: Not on file  . Number of children: Not on file  . Years of education: Not on file  . Highest education level: Not on file  Occupational History  .  Not on file  Social Needs  . Financial resource strain: Not on file  . Food insecurity:    Worry: Not on file    Inability: Not on file  . Transportation needs:    Medical: Not on file    Non-medical: Not on file  Tobacco Use  . Smoking status: Former Smoker    Years: 22.00    Last attempt to quit: 03/17/1972    Years since quitting: 46.0  . Smokeless tobacco: Never Used  Substance and Sexual Activity  . Alcohol use: Yes    Alcohol/week: 1.0 standard drinks    Types: 1 Glasses of wine per week    Comment: wine  . Drug use: No  . Sexual activity: Never  Lifestyle  . Physical activity:    Days per week: Not on file    Minutes per session: Not on file  . Stress: Not on file  Relationships  . Social connections:    Talks on phone: Not on file    Gets together: Not on file    Attends religious service: Not on file    Active member of club or organization: Not on file    Attends meetings of clubs or organizations: Not on file    Relationship status: Not on file  . Intimate partner violence:    Fear of current or ex partner: Not on file    Emotionally abused: Not on file    Physically abused: Not on file    Forced sexual activity: Not on file  Other Topics Concern  . Not on file  Social History  Narrative   Widow.  One daughter, lives with her.   Educ: college   Occup: retired Marine scientist.   No T/A/Ds.   She is almost a vegetarian.    Outpatient Medications Prior to Visit  Medication Sig Dispense Refill  . acetaminophen (TYLENOL) 500 MG tablet Take 500-1,000 mg 3 (three) times daily as needed by mouth for moderate pain.     [name] amiodarone (PACERONE) 200 MG tablet TAKE 1 TABLET BY MOUTH  DAILY 90 tablet 0  . carvedilol (COREG) 3.125 MG tablet Take 1 tablet (3.125 mg total) by mouth 2 (two) times daily. 30 tablet 0  . HYDROcodone-acetaminophen (NORCO) 5-325 MG tablet Take 1 tablet by mouth every 6 (six) hours as needed for moderate pain or severe pain. 60 tablet 0  . isosorbide mononitrate (IMDUR) 60 MG 24 hr tablet TAKE 1 TABLET BY MOUTH  DAILY 90 tablet 1  . ondansetron (ZOFRAN) 4 MG tablet Take 0.5-1 tablets (2-4 mg total) by mouth every 8 (eight) hours as needed for nausea or vomiting. 90 tablet 3  . polyethylene glycol (MIRALAX / GLYCOLAX) packet Take 17 g by mouth daily as needed for mild constipation.    . pravastatin (PRAVACHOL) 40 MG tablet TAKE 1 TABLET BY MOUTH  EVERY EVENING 90 tablet 1  . sevelamer carbonate (RENVELA) 800 MG tablet Take 800 mg by mouth See admin instructions. Two to three times daily with meals    . vitamin C (ASCORBIC ACID) 500 MG tablet Take 500 mg by mouth daily.    . Vitamin D, Ergocalciferol, (DRISDOL) 50000 units CAPS capsule Take 1 capsule (50,000 Units total) by mouth every 7 (seven) days. (Patient taking differently: Take 50,000 Units by mouth every Thursday. ) 12 capsule 3  . warfarin (COUMADIN) 3 MG tablet TAKE 1/2 TO 1 TABLET BY  MOUTH DAILY AS DIRECTED BY  COUMADIN CLINIC 90 tablet 1  No facility-administered medications prior to visit.     Allergies  Allergen Reactions  . Clonidine Derivatives Palpitations and Other (See Comments)    VERY SEDATED  . Adhesive [Tape] Other (See Comments)    Tears skin up  . Codeine Nausea Only  . Nickel Rash   . Sulfa Antibiotics Other (See Comments)    Reaction unknown    ROS Review of Systems  Constitutional: Negative for appetite change, chills, fatigue and fever.  HENT: Negative for congestion, dental problem, ear pain and sore throat.   Eyes: Negative for discharge, redness and visual disturbance.  Respiratory: Negative for cough, chest tightness, shortness of breath and wheezing.   Cardiovascular: Negative for chest pain, palpitations and leg swelling.  Gastrointestinal: Negative for abdominal pain, blood in stool, diarrhea, nausea and vomiting.  Genitourinary: Negative for difficulty urinating, dysuria, flank pain, frequency, hematuria and urgency.  Musculoskeletal: Positive for back pain (chronic mid back pain, w/out radiation). Negative for arthralgias, joint swelling, myalgias and neck stiffness.  Skin: Negative for pallor and rash.  Neurological: Negative for dizziness, speech difficulty, weakness and headaches.  Hematological: Negative for adenopathy. Does not bruise/bleed easily.  Psychiatric/Behavioral: Negative for confusion and sleep disturbance. The patient is not nervous/anxious.     PE; Blood pressure 133/71, pulse 72, temperature (!) 97.5 F (36.4 C), temperature source Oral, resp. rate 16, height 4' 8.75" (1.441 m), weight 122 lb (55.3 kg), SpO2 95 %. Body mass index is 26.63 kg/m.  Gen: Alert, well appearing.  Patient is oriented to person, place, time, and situation. AFFECT: pleasant, lucid thought and speech. ENT: Ears: EACs clear, normal epithelium.  TMs with good light reflex and landmarks bilaterally.  Eyes: no injection, icteris, swelling, or exudate.  EOMI, PERRLA. Nose: no drainage or turbinate edema/swelling.  No injection or focal lesion.  Mouth: lips without lesion/swelling.  Oral mucosa pink and moist.  Dentition intact and without obvious caries or gingival swelling.  Oropharynx without erythema, exudate, or swelling.  Neck: supple/nontender.  No LAD, mass,  or TM.  Carotid pulses 2+, with her heart murmur transmitted to both carotids. CV: RRR, 2/6 systolic murmur, no r/g.   LUNGS: CTA bilat, nonlabored resps, good aeration in all lung fields. ABD: soft, NT, ND, BS normal.  No hepatospenomegaly or mass.  No bruits. EXT: no clubbing or cyanosis.  1+ pitting edema. Musculoskeletal: no joint swelling, erythema, warmth, or tenderness.  ROM of all joints intact. Skin - no sores or suspicious lesions or rashes or color changes   Pertinent labs:  Lab Results  Component Value Date   TSH 1.097 12/15/2017   Lab Results  Component Value Date   WBC 6.6 02/06/2018   HGB 11.9 (L) 02/06/2018   HCT 38.3 02/06/2018   MCV 102.4 (H) 02/06/2018   PLT 154 02/06/2018   Lab Results  Component Value Date   CREATININE 4.43 (H) 02/06/2018   BUN 20 02/06/2018   NA 134 (L) 02/06/2018   K 4.5 02/06/2018   CL 94 (L) 02/06/2018   CO2 26 02/06/2018   Lab Results  Component Value Date   ALT 6 12/15/2017   AST 15 12/15/2017   ALKPHOS 60 12/15/2017   BILITOT 0.5 12/15/2017   Lab Results  Component Value Date   CHOL 140 06/17/2016   Lab Results  Component Value Date   HDL 39 (L) 06/17/2016   Lab Results  Component Value Date   LDLCALC 72 06/17/2016   Lab Results  Component Value Date  TRIG 146 06/17/2016   Lab Results  Component Value Date   CHOLHDL 3.6 06/17/2016   Lab Results  Component Value Date   HGBA1C 5.2 12/15/2016   ASSESSMENT AND PLAN:   1) ESRD, on HD T/Th/Sat:  She is doing pretty well with this, just significant fatigue after dialysis. She has no sign of fluid overload today. Will ask HD center to send over her latest blood work.  2) Chronic thoracic back pain: primarily osteoporotic deformity + chronic compression fx pain + very small amount of degenerative spinal stenosis at T7-T8 level. I encouraged her to try to f/u with Dr. Nelva Bush again to see if he had anything else to offer her from an interventional standpoint.  Most  recent CT of T spine was reviewed with her again today and there was no acute abnormality noted, just some chronic changes outlined in HPI above. Controlled substance contract reviewed with patient today.  Patient signed this and it will be placed in the chart.   No rx for pain med was needed today.  3) Health maintenance exam: Reviewed age and gender appropriate health maintenance issues (prudent diet, regular exercise, health risks of tobacco and excessive alcohol, use of seatbelts, fire alarms in home, use of sunscreen).  Also reviewed age and gender appropriate health screening as well as vaccine recommendations. Vaccines: shingrix rx sent to pharmacy.  O/w UTD. Labs: will get FLP.  Will ask dialysis center for most recent CBC, CMET. Cervical ca screening: pt defers/aged out. Breast ca screening:pt defers/aged out. Colon ca screening: pt defers/aged out.  An After Visit Summary was printed and given to the patient.  FOLLOW UP:  Return in about 6 months (around 09/26/2018) for routine chronic illness f/u.  Signed:  Crissie Sickles, MD           03/28/2018

## 2018-03-29 DIAGNOSIS — E876 Hypokalemia: Secondary | ICD-10-CM | POA: Diagnosis not present

## 2018-03-29 DIAGNOSIS — D631 Anemia in chronic kidney disease: Secondary | ICD-10-CM | POA: Diagnosis not present

## 2018-03-29 DIAGNOSIS — N2581 Secondary hyperparathyroidism of renal origin: Secondary | ICD-10-CM | POA: Diagnosis not present

## 2018-03-29 DIAGNOSIS — N186 End stage renal disease: Secondary | ICD-10-CM | POA: Diagnosis not present

## 2018-03-29 DIAGNOSIS — D689 Coagulation defect, unspecified: Secondary | ICD-10-CM | POA: Diagnosis not present

## 2018-03-30 ENCOUNTER — Ambulatory Visit (INDEPENDENT_AMBULATORY_CARE_PROVIDER_SITE_OTHER): Payer: Medicare Other | Admitting: Pharmacist Clinician (PhC)/ Clinical Pharmacy Specialist

## 2018-03-30 DIAGNOSIS — I639 Cerebral infarction, unspecified: Secondary | ICD-10-CM | POA: Diagnosis not present

## 2018-03-30 DIAGNOSIS — I4891 Unspecified atrial fibrillation: Secondary | ICD-10-CM

## 2018-03-30 DIAGNOSIS — Z7901 Long term (current) use of anticoagulants: Secondary | ICD-10-CM

## 2018-03-30 LAB — POCT INR: INR: 1.9 — AB (ref 2.0–3.0)

## 2018-03-31 DIAGNOSIS — D689 Coagulation defect, unspecified: Secondary | ICD-10-CM | POA: Diagnosis not present

## 2018-03-31 DIAGNOSIS — N2581 Secondary hyperparathyroidism of renal origin: Secondary | ICD-10-CM | POA: Diagnosis not present

## 2018-03-31 DIAGNOSIS — D631 Anemia in chronic kidney disease: Secondary | ICD-10-CM | POA: Diagnosis not present

## 2018-03-31 DIAGNOSIS — E876 Hypokalemia: Secondary | ICD-10-CM | POA: Diagnosis not present

## 2018-03-31 DIAGNOSIS — N186 End stage renal disease: Secondary | ICD-10-CM | POA: Diagnosis not present

## 2018-04-02 DIAGNOSIS — D689 Coagulation defect, unspecified: Secondary | ICD-10-CM | POA: Diagnosis not present

## 2018-04-02 DIAGNOSIS — N2581 Secondary hyperparathyroidism of renal origin: Secondary | ICD-10-CM | POA: Diagnosis not present

## 2018-04-02 DIAGNOSIS — D631 Anemia in chronic kidney disease: Secondary | ICD-10-CM | POA: Diagnosis not present

## 2018-04-02 DIAGNOSIS — N186 End stage renal disease: Secondary | ICD-10-CM | POA: Diagnosis not present

## 2018-04-02 DIAGNOSIS — E876 Hypokalemia: Secondary | ICD-10-CM | POA: Diagnosis not present

## 2018-04-05 DIAGNOSIS — E876 Hypokalemia: Secondary | ICD-10-CM | POA: Diagnosis not present

## 2018-04-05 DIAGNOSIS — D689 Coagulation defect, unspecified: Secondary | ICD-10-CM | POA: Diagnosis not present

## 2018-04-05 DIAGNOSIS — D631 Anemia in chronic kidney disease: Secondary | ICD-10-CM | POA: Diagnosis not present

## 2018-04-05 DIAGNOSIS — N186 End stage renal disease: Secondary | ICD-10-CM | POA: Diagnosis not present

## 2018-04-05 DIAGNOSIS — N2581 Secondary hyperparathyroidism of renal origin: Secondary | ICD-10-CM | POA: Diagnosis not present

## 2018-04-07 DIAGNOSIS — N186 End stage renal disease: Secondary | ICD-10-CM | POA: Diagnosis not present

## 2018-04-07 DIAGNOSIS — N2581 Secondary hyperparathyroidism of renal origin: Secondary | ICD-10-CM | POA: Diagnosis not present

## 2018-04-07 DIAGNOSIS — D689 Coagulation defect, unspecified: Secondary | ICD-10-CM | POA: Diagnosis not present

## 2018-04-07 DIAGNOSIS — D631 Anemia in chronic kidney disease: Secondary | ICD-10-CM | POA: Diagnosis not present

## 2018-04-07 DIAGNOSIS — E876 Hypokalemia: Secondary | ICD-10-CM | POA: Diagnosis not present

## 2018-04-09 DIAGNOSIS — N186 End stage renal disease: Secondary | ICD-10-CM | POA: Diagnosis not present

## 2018-04-09 DIAGNOSIS — E876 Hypokalemia: Secondary | ICD-10-CM | POA: Diagnosis not present

## 2018-04-09 DIAGNOSIS — N2581 Secondary hyperparathyroidism of renal origin: Secondary | ICD-10-CM | POA: Diagnosis not present

## 2018-04-09 DIAGNOSIS — D631 Anemia in chronic kidney disease: Secondary | ICD-10-CM | POA: Diagnosis not present

## 2018-04-09 DIAGNOSIS — D689 Coagulation defect, unspecified: Secondary | ICD-10-CM | POA: Diagnosis not present

## 2018-04-11 ENCOUNTER — Other Ambulatory Visit: Payer: Self-pay | Admitting: Cardiovascular Disease

## 2018-04-11 DIAGNOSIS — N186 End stage renal disease: Secondary | ICD-10-CM | POA: Diagnosis not present

## 2018-04-11 DIAGNOSIS — E876 Hypokalemia: Secondary | ICD-10-CM | POA: Diagnosis not present

## 2018-04-11 DIAGNOSIS — N2581 Secondary hyperparathyroidism of renal origin: Secondary | ICD-10-CM | POA: Diagnosis not present

## 2018-04-11 DIAGNOSIS — D631 Anemia in chronic kidney disease: Secondary | ICD-10-CM | POA: Diagnosis not present

## 2018-04-11 DIAGNOSIS — D689 Coagulation defect, unspecified: Secondary | ICD-10-CM | POA: Diagnosis not present

## 2018-04-13 DIAGNOSIS — D631 Anemia in chronic kidney disease: Secondary | ICD-10-CM | POA: Diagnosis not present

## 2018-04-13 DIAGNOSIS — N186 End stage renal disease: Secondary | ICD-10-CM | POA: Diagnosis not present

## 2018-04-13 DIAGNOSIS — N2581 Secondary hyperparathyroidism of renal origin: Secondary | ICD-10-CM | POA: Diagnosis not present

## 2018-04-13 DIAGNOSIS — D689 Coagulation defect, unspecified: Secondary | ICD-10-CM | POA: Diagnosis not present

## 2018-04-13 DIAGNOSIS — E876 Hypokalemia: Secondary | ICD-10-CM | POA: Diagnosis not present

## 2018-04-16 DIAGNOSIS — E876 Hypokalemia: Secondary | ICD-10-CM | POA: Diagnosis not present

## 2018-04-16 DIAGNOSIS — I120 Hypertensive chronic kidney disease with stage 5 chronic kidney disease or end stage renal disease: Secondary | ICD-10-CM | POA: Diagnosis not present

## 2018-04-16 DIAGNOSIS — N2581 Secondary hyperparathyroidism of renal origin: Secondary | ICD-10-CM | POA: Diagnosis not present

## 2018-04-16 DIAGNOSIS — D689 Coagulation defect, unspecified: Secondary | ICD-10-CM | POA: Diagnosis not present

## 2018-04-16 DIAGNOSIS — D631 Anemia in chronic kidney disease: Secondary | ICD-10-CM | POA: Diagnosis not present

## 2018-04-16 DIAGNOSIS — Z992 Dependence on renal dialysis: Secondary | ICD-10-CM | POA: Diagnosis not present

## 2018-04-16 DIAGNOSIS — N186 End stage renal disease: Secondary | ICD-10-CM | POA: Diagnosis not present

## 2018-04-19 DIAGNOSIS — N2581 Secondary hyperparathyroidism of renal origin: Secondary | ICD-10-CM | POA: Diagnosis not present

## 2018-04-19 DIAGNOSIS — E876 Hypokalemia: Secondary | ICD-10-CM | POA: Diagnosis not present

## 2018-04-19 DIAGNOSIS — D689 Coagulation defect, unspecified: Secondary | ICD-10-CM | POA: Diagnosis not present

## 2018-04-19 DIAGNOSIS — D509 Iron deficiency anemia, unspecified: Secondary | ICD-10-CM | POA: Diagnosis not present

## 2018-04-19 DIAGNOSIS — N186 End stage renal disease: Secondary | ICD-10-CM | POA: Diagnosis not present

## 2018-04-20 ENCOUNTER — Ambulatory Visit (INDEPENDENT_AMBULATORY_CARE_PROVIDER_SITE_OTHER): Payer: Medicare Other | Admitting: Pharmacist

## 2018-04-20 DIAGNOSIS — Z7901 Long term (current) use of anticoagulants: Secondary | ICD-10-CM

## 2018-04-20 DIAGNOSIS — I4891 Unspecified atrial fibrillation: Secondary | ICD-10-CM

## 2018-04-20 LAB — POCT INR: INR: 2.9 (ref 2.0–3.0)

## 2018-04-21 DIAGNOSIS — N2581 Secondary hyperparathyroidism of renal origin: Secondary | ICD-10-CM | POA: Diagnosis not present

## 2018-04-21 DIAGNOSIS — E876 Hypokalemia: Secondary | ICD-10-CM | POA: Diagnosis not present

## 2018-04-21 DIAGNOSIS — D509 Iron deficiency anemia, unspecified: Secondary | ICD-10-CM | POA: Diagnosis not present

## 2018-04-21 DIAGNOSIS — N186 End stage renal disease: Secondary | ICD-10-CM | POA: Diagnosis not present

## 2018-04-21 DIAGNOSIS — D689 Coagulation defect, unspecified: Secondary | ICD-10-CM | POA: Diagnosis not present

## 2018-04-23 DIAGNOSIS — D509 Iron deficiency anemia, unspecified: Secondary | ICD-10-CM | POA: Diagnosis not present

## 2018-04-23 DIAGNOSIS — N186 End stage renal disease: Secondary | ICD-10-CM | POA: Diagnosis not present

## 2018-04-23 DIAGNOSIS — N2581 Secondary hyperparathyroidism of renal origin: Secondary | ICD-10-CM | POA: Diagnosis not present

## 2018-04-23 DIAGNOSIS — E876 Hypokalemia: Secondary | ICD-10-CM | POA: Diagnosis not present

## 2018-04-23 DIAGNOSIS — D689 Coagulation defect, unspecified: Secondary | ICD-10-CM | POA: Diagnosis not present

## 2018-04-24 ENCOUNTER — Other Ambulatory Visit: Payer: Self-pay | Admitting: Family Medicine

## 2018-04-25 DIAGNOSIS — D509 Iron deficiency anemia, unspecified: Secondary | ICD-10-CM | POA: Insufficient documentation

## 2018-04-25 NOTE — Telephone Encounter (Signed)
RF request for Vitamin D LOV: 03/28/18  Next ov: None Last written: 05/03/17 #12 w/ 3 RF  Please advise. Thanks.

## 2018-04-26 DIAGNOSIS — D509 Iron deficiency anemia, unspecified: Secondary | ICD-10-CM | POA: Diagnosis not present

## 2018-04-26 DIAGNOSIS — D689 Coagulation defect, unspecified: Secondary | ICD-10-CM | POA: Diagnosis not present

## 2018-04-26 DIAGNOSIS — N186 End stage renal disease: Secondary | ICD-10-CM | POA: Diagnosis not present

## 2018-04-26 DIAGNOSIS — N2581 Secondary hyperparathyroidism of renal origin: Secondary | ICD-10-CM | POA: Diagnosis not present

## 2018-04-26 DIAGNOSIS — E876 Hypokalemia: Secondary | ICD-10-CM | POA: Diagnosis not present

## 2018-04-28 DIAGNOSIS — D689 Coagulation defect, unspecified: Secondary | ICD-10-CM | POA: Diagnosis not present

## 2018-04-28 DIAGNOSIS — N2581 Secondary hyperparathyroidism of renal origin: Secondary | ICD-10-CM | POA: Diagnosis not present

## 2018-04-28 DIAGNOSIS — D509 Iron deficiency anemia, unspecified: Secondary | ICD-10-CM | POA: Diagnosis not present

## 2018-04-28 DIAGNOSIS — E876 Hypokalemia: Secondary | ICD-10-CM | POA: Diagnosis not present

## 2018-04-28 DIAGNOSIS — N186 End stage renal disease: Secondary | ICD-10-CM | POA: Diagnosis not present

## 2018-04-30 DIAGNOSIS — N186 End stage renal disease: Secondary | ICD-10-CM | POA: Diagnosis not present

## 2018-04-30 DIAGNOSIS — N2581 Secondary hyperparathyroidism of renal origin: Secondary | ICD-10-CM | POA: Diagnosis not present

## 2018-04-30 DIAGNOSIS — D509 Iron deficiency anemia, unspecified: Secondary | ICD-10-CM | POA: Diagnosis not present

## 2018-04-30 DIAGNOSIS — E876 Hypokalemia: Secondary | ICD-10-CM | POA: Diagnosis not present

## 2018-04-30 DIAGNOSIS — D689 Coagulation defect, unspecified: Secondary | ICD-10-CM | POA: Diagnosis not present

## 2018-05-03 DIAGNOSIS — E876 Hypokalemia: Secondary | ICD-10-CM | POA: Diagnosis not present

## 2018-05-03 DIAGNOSIS — N186 End stage renal disease: Secondary | ICD-10-CM | POA: Diagnosis not present

## 2018-05-03 DIAGNOSIS — N2581 Secondary hyperparathyroidism of renal origin: Secondary | ICD-10-CM | POA: Diagnosis not present

## 2018-05-03 DIAGNOSIS — D689 Coagulation defect, unspecified: Secondary | ICD-10-CM | POA: Diagnosis not present

## 2018-05-03 DIAGNOSIS — D509 Iron deficiency anemia, unspecified: Secondary | ICD-10-CM | POA: Diagnosis not present

## 2018-05-05 DIAGNOSIS — D689 Coagulation defect, unspecified: Secondary | ICD-10-CM | POA: Diagnosis not present

## 2018-05-05 DIAGNOSIS — N2581 Secondary hyperparathyroidism of renal origin: Secondary | ICD-10-CM | POA: Diagnosis not present

## 2018-05-05 DIAGNOSIS — N186 End stage renal disease: Secondary | ICD-10-CM | POA: Diagnosis not present

## 2018-05-05 DIAGNOSIS — E876 Hypokalemia: Secondary | ICD-10-CM | POA: Diagnosis not present

## 2018-05-05 DIAGNOSIS — D509 Iron deficiency anemia, unspecified: Secondary | ICD-10-CM | POA: Diagnosis not present

## 2018-05-06 DIAGNOSIS — M5116 Intervertebral disc disorders with radiculopathy, lumbar region: Secondary | ICD-10-CM | POA: Diagnosis not present

## 2018-05-06 DIAGNOSIS — M5136 Other intervertebral disc degeneration, lumbar region: Secondary | ICD-10-CM | POA: Diagnosis not present

## 2018-05-06 DIAGNOSIS — M5416 Radiculopathy, lumbar region: Secondary | ICD-10-CM | POA: Diagnosis not present

## 2018-05-07 DIAGNOSIS — D689 Coagulation defect, unspecified: Secondary | ICD-10-CM | POA: Diagnosis not present

## 2018-05-07 DIAGNOSIS — N2581 Secondary hyperparathyroidism of renal origin: Secondary | ICD-10-CM | POA: Diagnosis not present

## 2018-05-07 DIAGNOSIS — N186 End stage renal disease: Secondary | ICD-10-CM | POA: Diagnosis not present

## 2018-05-07 DIAGNOSIS — D509 Iron deficiency anemia, unspecified: Secondary | ICD-10-CM | POA: Diagnosis not present

## 2018-05-07 DIAGNOSIS — E876 Hypokalemia: Secondary | ICD-10-CM | POA: Diagnosis not present

## 2018-05-09 DIAGNOSIS — N186 End stage renal disease: Secondary | ICD-10-CM | POA: Diagnosis not present

## 2018-05-09 DIAGNOSIS — N2581 Secondary hyperparathyroidism of renal origin: Secondary | ICD-10-CM | POA: Diagnosis not present

## 2018-05-09 DIAGNOSIS — E876 Hypokalemia: Secondary | ICD-10-CM | POA: Diagnosis not present

## 2018-05-09 DIAGNOSIS — D509 Iron deficiency anemia, unspecified: Secondary | ICD-10-CM | POA: Diagnosis not present

## 2018-05-09 DIAGNOSIS — D689 Coagulation defect, unspecified: Secondary | ICD-10-CM | POA: Diagnosis not present

## 2018-05-12 ENCOUNTER — Other Ambulatory Visit: Payer: Self-pay

## 2018-05-12 DIAGNOSIS — N2581 Secondary hyperparathyroidism of renal origin: Secondary | ICD-10-CM | POA: Diagnosis not present

## 2018-05-12 DIAGNOSIS — D689 Coagulation defect, unspecified: Secondary | ICD-10-CM | POA: Diagnosis not present

## 2018-05-12 DIAGNOSIS — E876 Hypokalemia: Secondary | ICD-10-CM | POA: Diagnosis not present

## 2018-05-12 DIAGNOSIS — N186 End stage renal disease: Secondary | ICD-10-CM | POA: Diagnosis not present

## 2018-05-12 DIAGNOSIS — D509 Iron deficiency anemia, unspecified: Secondary | ICD-10-CM | POA: Diagnosis not present

## 2018-05-12 NOTE — Patient Outreach (Signed)
[facility]) Care Management  05/12/2018  Sundy Houchins 1932-05-23 [id number]   Medication Adherence call to Mrs. [name] patient is due on Pravastatin 40 mg she picks up the phone but does not answer  try severe times. Mrs. Gomm is showing past due under [place].   [place] Management Direct Dial [phone]  Fax [phone] [email or web address]

## 2018-05-14 DIAGNOSIS — E876 Hypokalemia: Secondary | ICD-10-CM | POA: Diagnosis not present

## 2018-05-14 DIAGNOSIS — G8929 Other chronic pain: Secondary | ICD-10-CM | POA: Insufficient documentation

## 2018-05-14 DIAGNOSIS — D689 Coagulation defect, unspecified: Secondary | ICD-10-CM | POA: Diagnosis not present

## 2018-05-14 DIAGNOSIS — M545 Low back pain: Secondary | ICD-10-CM | POA: Diagnosis not present

## 2018-05-14 DIAGNOSIS — N186 End stage renal disease: Secondary | ICD-10-CM | POA: Diagnosis not present

## 2018-05-14 DIAGNOSIS — N2581 Secondary hyperparathyroidism of renal origin: Secondary | ICD-10-CM | POA: Diagnosis not present

## 2018-05-14 DIAGNOSIS — D509 Iron deficiency anemia, unspecified: Secondary | ICD-10-CM | POA: Diagnosis not present

## 2018-05-14 DIAGNOSIS — M549 Dorsalgia, unspecified: Secondary | ICD-10-CM

## 2018-05-14 DIAGNOSIS — M5136 Other intervertebral disc degeneration, lumbar region: Secondary | ICD-10-CM | POA: Diagnosis not present

## 2018-05-14 DIAGNOSIS — G894 Chronic pain syndrome: Secondary | ICD-10-CM | POA: Insufficient documentation

## 2018-05-14 LAB — CUP PACEART REMOTE DEVICE CHECK
Battery Remaining Percentage: 95.5 %
Battery Voltage: 3.01 V
Brady Statistic AS VP Percent: 1 %
Brady Statistic AS VS Percent: 94 %
Implantable Lead Implant Date: 20180719
Implantable Lead Location: 753860
Implantable Pulse Generator Implant Date: 20180719
Lead Channel Pacing Threshold Amplitude: 0.75 V
Lead Channel Pacing Threshold Pulse Width: 0.5 ms
Lead Channel Sensing Intrinsic Amplitude: 4.7 mV
Lead Channel Setting Pacing Amplitude: 2.5 V
MDC IDC LEAD IMPLANT DT: 20180719
MDC IDC LEAD LOCATION: 753859
MDC IDC MSMT BATTERY REMAINING LONGEVITY: 125 mo
MDC IDC MSMT LEADCHNL RA IMPEDANCE VALUE: 410 Ohm
MDC IDC MSMT LEADCHNL RA PACING THRESHOLD PULSEWIDTH: 0.5 ms
MDC IDC MSMT LEADCHNL RV IMPEDANCE VALUE: 480 Ohm
MDC IDC MSMT LEADCHNL RV PACING THRESHOLD AMPLITUDE: 0.75 V
MDC IDC MSMT LEADCHNL RV SENSING INTR AMPL: 12 mV
MDC IDC PG SERIAL: 8918511
MDC IDC SESS DTM: 20191023192649
MDC IDC SET LEADCHNL RA PACING AMPLITUDE: 2 V
MDC IDC SET LEADCHNL RV PACING PULSEWIDTH: 0.5 ms
MDC IDC SET LEADCHNL RV SENSING SENSITIVITY: 2 mV
MDC IDC STAT BRADY AP VP PERCENT: 1 %
MDC IDC STAT BRADY AP VS PERCENT: 6.4 %
MDC IDC STAT BRADY RA PERCENT PACED: 6.2 %
MDC IDC STAT BRADY RV PERCENT PACED: 1 %
Pulse Gen Model: 2272

## 2018-05-15 ENCOUNTER — Emergency Department (HOSPITAL_COMMUNITY): Payer: Medicare Other

## 2018-05-15 ENCOUNTER — Encounter (HOSPITAL_COMMUNITY): Payer: Self-pay | Admitting: *Deleted

## 2018-05-15 ENCOUNTER — Emergency Department (HOSPITAL_COMMUNITY)
Admission: EM | Admit: 2018-05-15 | Discharge: 2018-05-15 | Disposition: A | Discharge status: Home / Self Care | Payer: Medicare Other | Attending: Emergency Medicine | Admitting: Emergency Medicine

## 2018-05-15 DIAGNOSIS — Z7901 Long term (current) use of anticoagulants: Secondary | ICD-10-CM | POA: Diagnosis not present

## 2018-05-15 DIAGNOSIS — I132 Hypertensive heart and chronic kidney disease with heart failure and with stage 5 chronic kidney disease, or end stage renal disease: Secondary | ICD-10-CM | POA: Insufficient documentation

## 2018-05-15 DIAGNOSIS — Z79899 Other long term (current) drug therapy: Secondary | ICD-10-CM | POA: Diagnosis not present

## 2018-05-15 DIAGNOSIS — W01198A Fall on same level from slipping, tripping and stumbling with subsequent striking against other object, initial encounter: Secondary | ICD-10-CM | POA: Insufficient documentation

## 2018-05-15 DIAGNOSIS — Z87891 Personal history of nicotine dependence: Secondary | ICD-10-CM | POA: Diagnosis not present

## 2018-05-15 DIAGNOSIS — N186 End stage renal disease: Secondary | ICD-10-CM | POA: Diagnosis not present

## 2018-05-15 DIAGNOSIS — I480 Paroxysmal atrial fibrillation: Secondary | ICD-10-CM | POA: Insufficient documentation

## 2018-05-15 DIAGNOSIS — Y998 Other external cause status: Secondary | ICD-10-CM | POA: Insufficient documentation

## 2018-05-15 DIAGNOSIS — S8992XA Unspecified injury of left lower leg, initial encounter: Secondary | ICD-10-CM | POA: Diagnosis not present

## 2018-05-15 DIAGNOSIS — Y939 Activity, unspecified: Secondary | ICD-10-CM | POA: Diagnosis not present

## 2018-05-15 DIAGNOSIS — I10 Essential (primary) hypertension: Secondary | ICD-10-CM | POA: Diagnosis not present

## 2018-05-15 DIAGNOSIS — M25552 Pain in left hip: Secondary | ICD-10-CM | POA: Insufficient documentation

## 2018-05-15 DIAGNOSIS — W19XXXA Unspecified fall, initial encounter: Secondary | ICD-10-CM

## 2018-05-15 DIAGNOSIS — E785 Hyperlipidemia, unspecified: Secondary | ICD-10-CM | POA: Diagnosis not present

## 2018-05-15 DIAGNOSIS — Z8673 Personal history of transient ischemic attack (TIA), and cerebral infarction without residual deficits: Secondary | ICD-10-CM | POA: Diagnosis not present

## 2018-05-15 DIAGNOSIS — M25551 Pain in right hip: Secondary | ICD-10-CM | POA: Diagnosis not present

## 2018-05-15 DIAGNOSIS — I428 Other cardiomyopathies: Secondary | ICD-10-CM | POA: Diagnosis not present

## 2018-05-15 DIAGNOSIS — Z950 Presence of cardiac pacemaker: Secondary | ICD-10-CM | POA: Diagnosis not present

## 2018-05-15 DIAGNOSIS — I5042 Chronic combined systolic (congestive) and diastolic (congestive) heart failure: Secondary | ICD-10-CM | POA: Insufficient documentation

## 2018-05-15 DIAGNOSIS — Y92009 Unspecified place in unspecified non-institutional (private) residence as the place of occurrence of the external cause: Secondary | ICD-10-CM | POA: Diagnosis not present

## 2018-05-15 DIAGNOSIS — S79912A Unspecified injury of left hip, initial encounter: Secondary | ICD-10-CM | POA: Diagnosis not present

## 2018-05-15 DIAGNOSIS — M79605 Pain in left leg: Secondary | ICD-10-CM | POA: Diagnosis not present

## 2018-05-15 DIAGNOSIS — M791 Myalgia, unspecified site: Secondary | ICD-10-CM | POA: Diagnosis not present

## 2018-05-15 NOTE — ED Triage Notes (Signed)
Pt in after fall yesterday, states she tripped over a box, denies hitting her head or LOC, landed on her left hip, has been able to walk since that time but this morning pain is getting worse

## 2018-05-15 NOTE — ED Notes (Signed)
Pt. Able to ambulate well with walker.

## 2018-05-15 NOTE — ED Provider Notes (Signed)
Medical screening examination/treatment/procedure(s) were conducted as a shared visit with non-physician practitioner(s) and myself.  I personally evaluated the patient during the encounter.  None 84 year old female presents after mechanical fall yesterday.  Patient has pain to her left hip and left knee.  Plain x-rays and CT of left hip negative.  Continues to have pain.  Will likely need advanced imaging.   Lacretia Leigh, MD 05/15/18 [phone]

## 2018-05-15 NOTE — ED Notes (Signed)
Patient verbalized understanding of discharge instructions and denies any further needs or questions at this time. VS stable. Patient ambulatory with steady gait using walker - patient to use assistive devices at home.

## 2018-05-15 NOTE — ED Provider Notes (Signed)
[facility] EMERGENCY DEPARTMENT Provider Note   CSN: [id number] Arrival date & time: 05/15/18  7096     History   Chief Complaint Chief Complaint  Patient presents with  . Fall    HPI Valerie Terrell is a 84 y.o. female.  The history is provided by the patient and medical records. No language interpreter was used.  Fall   Valerie Terrell is a 84 y.o. female  with a PMH as listed below who presents to the Emergency Department complaining of left hip pain after mechanical fall yesterday morning.  Patient lives at home with daughter.  Daughter was working in the office when she heard a thud and found her mother on the floor.  She states that she tripped over a box.  She denies hitting her head or having loss of consciousness.  She landed on her left hip.  She has been having pain to the left hip since.  She has been ambulatory, although states that she has just been shuffling around and it is very painful for her.  This morning, she awoke with worsening pain.  Denies any numbness or weakness.    Past Medical History:  Diagnosis Date  . Anemia of chronic disease 2018   + anemia of CRI?  [name] Atrial fibrillation ([place]) [I48.91] 12/11/2016  . Atrophy of left kidney    with absent blood flow by renal artery dopplers (Dr. Gwenlyn Found)  . 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 Chronic combined systolic and diastolic CHF (congestive heart failure) ([place])    a. 11/2016: echo showing EF of 35-40%, RV strain noted, mild MR and mild TR.   Echo showed much improved EF 12/2017 (55-60%)  . Chronic respiratory failure with hypoxia ([place]) 02/11/2017   Now on chronic O2 (intermittent as of summer 2019).    . Debilitated patient    WC dependent as of 2018  . ESRD on hemodialysis ([place]) 01/2017   T/Th/Sat schedule- [place].  Right basilic AV fistula 28/3662--HUT unable to successfully access this.  Dr. Donnetta Hutching to do an upper arm AV graft as of 10/2017..  . History of adenomatous  polyp of colon   . History of kidney stones    passed  . History of subarachnoid hemorrhage 10/2014   after syncope and while on xarelto  . HOH (hard of hearing)   . Hyperlipidemia   . Hypertension    Difficult to control, in the setting of one functioning kidney: pt was referred to nephrology by Dr. Gwenlyn Found 06/2015.  . Lumbar radiculopathy 2012  . Lumbar spinal stenosis 2019   facet injections only very short term relief.  As of 09/2017 ortho f/u, selective nerve root block planned.  . Lumbar spondylosis    MR 07/2016---no sign of spinal nerve compression or cord compression.  Pt set up with outpt ortho while admitted to hosp 07/2016.  . Malnourished ([place])   . Metatarsal fracture 06/10/2016   Nondisplaced, left 5th metatarsal--pt was referred to ortho  . Nonischemic cardiomyopathy ([place])    EF improved to 55-60% on echo 12/2017  . Osteopenia 2014   T-score -2.1  . PAF (paroxysmal atrial fibrillation) (HCC)    Eliquis started after BRAO and CVA.  She was changed to warfarin 2018--INR/coumadin mgmt per HD/nephrol.  . Presence of permanent cardiac pacemaker   . Right rib fracture 12/2016   s/p fall  . Sick sinus syndrome ([place]) 11/2016   Dual chamber pacer insertion 2018 (Dr. Lovena Le)  .  Stroke ([place]) 02/2584   cardioembolic (had CVA while on no anticoag)--"scattered subacute punctate infarcts: 1 in R parietal lobe and 2 in occipital cortex" on MRI br.  CT angio head/neck: aortic arch athero.  R ICA 20% stenosis, L ICA w/out any stenosis.  . Thoracic back pain 01/2017; 01/2018   2018: Facet?  Dr. Ramos->steroid injection.  01/2018-->CT T spine to check for a new comp fx-->none found.  Selective nerve root inj in T spine helpful on R side.   . Thoracic compression fracture ([place]) 07/2016   T3.  T7 and T8-- T8 kyphoplasty during hosp admission 07/2016.  Neuro referred pt to pain mgmt for consideration of injection 09/2016.  I referred her to endo 08/2016 for consideration of calcitonin treatment.     Patient Active Problem List   Diagnosis Date Noted  . Hypoxia 12/14/2017  . Dyspnea 12/14/2017  . ESRD (end stage renal disease) ([place]) 02/13/2017  . Acute respiratory failure ([place]) 02/13/2017  . Hyperkalemia 02/13/2017  . ESRD on dialysis ([place]) 02/11/2017  . Chronic respiratory failure with hypoxia ([place]) 02/11/2017  . Debilitated patient 02/07/2017  . Poor tolerance for ambulation 02/07/2017  . Fracture of one rib, right side, initial encounter for closed fracture 01/11/2017  . Acute on chronic systolic congestive heart failure ([place])   . Closed fracture of one rib of right side   . Chronic combined systolic and diastolic heart failure ([place]) 01/05/2017  . Palpitations 12/15/2016  . Hypokalemia 12/14/2016  . Leukocytosis 12/14/2016  . Atrial fibrillation ([place]) [I48.91] 12/11/2016  . Long term (current) use of anticoagulants [Z79.01] 12/11/2016  . Tachycardia-bradycardia syndrome ([place]) - s/p PPM 12/07/2016  . Non-ischemic cardiomyopathy ([place]) 12/07/2016  . Acute combined systolic and diastolic heart failure ([place])  12/07/2016  . Cardiac pacemaker   . Acute on chronic respiratory failure with hypoxia ([place])   . Acute pulmonary edema with congestive heart failure ([place]) 11/30/2016  . Elevated troponin level 11/30/2016  . Anemia 11/30/2016  . Chronic midline thoracic back pain 09/15/2016  . Acute CVA (cerebrovascular accident) ([place]) 09/15/2016  . Hyperlipidemia   . Nausea & vomiting 08/05/2016  . Dehydration 08/05/2016  . Acute kidney injury superimposed on CKD ([place]) 08/05/2016  . Osteoporosis 07/28/2016  . Closed compression fracture of thoracic vertebra ([place]) 07/23/2016  . Thoracic compression fracture ([place]) 07/22/2016  . Acute renal failure superimposed on chronic kidney disease ([place]) 07/11/2016  . Flank pain 07/11/2016  . Hypoalbuminemia 07/11/2016  . Renal artery stenosis ([place]) 06/24/2016  . Nondisplaced fracture of fifth metatarsal bone, left foot, initial encounter for  closed fracture 06/10/2016  . Rib contusion, left, initial encounter 06/10/2016  . Single kidney 03/13/2016  . Chest pain   . Dyslipidemia   . PAF (paroxysmal atrial fibrillation) ([place]) 01/28/2016  . Essential hypertension 01/28/2016  . History of cardioembolic stroke 27/78/2423  . 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 Personal history of subarachnoid hemorrhage 01/28/2016  . CKD (chronic kidney disease), stage IV ([place]) 01/28/2016    Past Surgical History:  Procedure Laterality Date  . APPENDECTOMY  child  . AV FISTULA PLACEMENT Right 03/31/2017   Procedure: Right ARM Basilic vein transposition;  Surgeon: Conrad Corralitos, MD;  Location: [facility] OR;  Service: Vascular;  Laterality: Right;  . AV FISTULA PLACEMENT Right 12/06/2017   Procedure: INSERTION OF ARTERIOVENOUS (AV) GORE-TEX GRAFT ARM RIGHT UPPER EXTREMITY;  Surgeon: [name], MD;  Location: [facility];  Service: Vascular;  Laterality: Right;  . CARDIOVASCULAR STRESS  TEST  01/31/2016   Stress myoview: NORMAL/Low risk.  EF 56%.  . [place]  . COLONOSCOPY  2015   + hx of adenomatous polyps.  Need digest health spec in [place] records to see when pt due for next colonoscopy  . EYE SURGERY Bilateral    Catarct  . INSERTION OF DIALYSIS CATHETER Right 01/29/2017   Procedure: INSERTION OF DIALYSIS CATHETER- RIGHT INTERNAL JUGULAR;  Surgeon: [name], MD;  Location: [facility] OR;  Service: Vascular;  Laterality: Right;  . IR GENERIC HISTORICAL  07/24/2016   IR KYPHO THORACIC WITH BONE BIOPSY 07/24/2016 Luanne Bras, MD [facility]-INTERV RAD  . KYPHOPLASTY  07/27/2016   T8  . PACEMAKER IMPLANT N/A 12/03/2016   Procedure: Pacemaker Implant;  Surgeon: Evans Lance, MD;  Location: [facility] CV LAB;  Service: Cardiovascular;  Laterality: N/A;  . Renal artery dopplers  02/26/2016   Her right renal dimension was 11 cm pole to pole with mild to moderate right renal artery stenosis. A right renal aortic ratio was 3.22  suggesting less than a 50% stenosis.  . Right upper arm AV Gore-Tex graft  12/06/2017   Dr. Donnetta Hutching  . TONSILLECTOMY    . TRANSTHORACIC ECHOCARDIOGRAM  01/29/2016; 11/2016; 12/2017   2017: EF 55-60%, normal LV wall motion, grade I DD.  No cardiac source of emboli was seen.  2018: EF 35-40%, could not assess DD due to a-fib, pulm HTN noted. 12/2017 EF 55-60%, nl wall motion, grd II DD, mod MR and pulm regurg, increased pulm pressure (peak 52).     OB History   No obstetric history on file.      Home Medications    Prior to Admission medications   Medication Sig Start Date End Date Taking? Authorizing Provider  acetaminophen (TYLENOL) 500 MG tablet Take 500-1,000 mg 3 (three) times daily as needed by mouth for moderate pain.    Yes [provider]  amiodarone (PACERONE) 200 MG tablet TAKE 1 TABLET BY MOUTH  DAILY Patient taking differently: Take 200 mg by mouth daily.  04/11/18  Yes Lorretta Harp, MD  carvedilol (COREG) 3.125 MG tablet Take 1 tablet (3.125 mg total) by mouth 2 (two) times daily. 07/21/17  Yes Lorretta Harp, MD  HYDROcodone-acetaminophen (NORCO) 5-325 MG tablet Take 1 tablet by mouth every 6 (six) hours as needed for moderate pain or severe pain. 12/20/17  Yes McGowen, Adrian Blackwater, MD  isosorbide mononitrate (IMDUR) 60 MG 24 hr tablet TAKE 1 TABLET BY MOUTH  DAILY Patient taking differently: Take 60 mg by mouth daily.  03/09/18  Yes McGowen, Adrian Blackwater, MD  ondansetron (ZOFRAN) 4 MG tablet Take 0.5-1 tablets (2-4 mg total) by mouth every 8 (eight) hours as needed for nausea or vomiting. 12/10/16  Yes McGowen, Adrian Blackwater, MD  polyethylene glycol (MIRALAX / GLYCOLAX) packet Take 17 g by mouth daily as needed for mild constipation.   Yes [provider]  pravastatin (PRAVACHOL) 40 MG tablet TAKE 1 TABLET BY MOUTH  EVERY EVENING Patient taking differently: Take 40 mg by mouth daily.  03/09/18  Yes McGowen, Adrian Blackwater, MD  sevelamer carbonate (RENVELA) 800 MG tablet Take  800 mg by mouth See admin instructions. Two to three times daily with meals   Yes [provider]  vitamin C (ASCORBIC ACID) 500 MG tablet Take 500 mg by mouth daily.   Yes [provider]  Vitamin D, Ergocalciferol, (DRISDOL) 1.25 MG (50000 UT) CAPS capsule Take 1 capsule (  50,000 Units total) by mouth every Thursday. 04/28/18  Yes McGowen, Adrian Blackwater, MD  warfarin (COUMADIN) 3 MG tablet TAKE 1/2 TO 1 TABLET BY  MOUTH DAILY AS DIRECTED BY  COUMADIN CLINIC Patient taking differently: Take 1.5-3 mg by mouth See admin instructions. Take 0.5 tablet (1.5 mg totally) by mouth on Mon, Wed, and Friday; Take 1 tablet (3 mg totally) by mouth on Mon, Wed, and Friday 12/24/17  Yes Lorretta Harp, MD    Family History Family History  Problem Relation Age of Onset  . Cancer Mother   . Heart disease Father   . Early death Father   . Sudden Cardiac Death Neg Hx     Social History Social History   Tobacco Use  . Smoking status: Former Smoker    Years: 22.00    Last attempt to quit: 03/17/1972    Years since quitting: 46.1  . Smokeless tobacco: Never Used  Substance Use Topics  . Alcohol use: Yes    Alcohol/week: 1.0 standard drinks    Types: 1 Glasses of wine per week    Comment: wine  . Drug use: No     Allergies   Clonidine derivatives; Adhesive [tape]; Codeine; Nickel; and Sulfa antibiotics   Review of Systems Review of Systems  Musculoskeletal: Positive for arthralgias and myalgias.  All other systems reviewed and are negative.    Physical Exam Updated Vital Signs BP (!) 170/49 (BP Location: Right Arm)   Pulse 77   Temp 98.4 F (36.9 C) (Oral)   Resp 16   SpO2 (!) 89%   Physical Exam Vitals signs and nursing note reviewed.  Constitutional:      General: She is not in acute distress.    Appearance: She is well-developed.  HENT:     Head: Normocephalic and atraumatic.  Cardiovascular:     Rate and Rhythm: Normal rate and regular rhythm.     Heart  sounds: Normal heart sounds. No murmur.  Pulmonary:     Effort: Pulmonary effort is normal. No respiratory distress.     Breath sounds: Normal breath sounds.  Abdominal:     General: There is no distension.     Palpations: Abdomen is soft.     Tenderness: There is no abdominal tenderness.  Musculoskeletal:     Comments: No C/T/L-spine tenderness.  Tenderness to palpation of left lateral hip. Also has diffuse tenderness to the left knee.  Good strength to bilateral lower extremities.  2+ DP and sensation intact to bilateral lower extremities.  Skin:    General: Skin is warm and dry.  Neurological:     Mental Status: She is alert and oriented to person, place, and time.      ED Treatments / Results  Labs (all labs ordered are listed, but only abnormal results are displayed) Labs Reviewed - No data to display  EKG None  Radiology Ct Hip Left Wo Contrast  Result Date: 05/15/2018 CLINICAL DATA:  Progressive left hip pain since a fall yesterday. EXAM: CT OF THE LEFT HIP WITHOUT CONTRAST TECHNIQUE: Multidetector CT imaging of the left hip was performed according to the standard protocol. Multiplanar CT image reconstructions were also generated. COMPARISON:  Radiographs dated 05/15/2018 FINDINGS: Bones/Joint/Cartilage There is no fracture or dislocation. There is symmetrical joint space narrowing of the left hip. No joint effusion. Muscles and Tendons Negative. Soft tissues Atherosclerosis. Sigmoid diverticulosis. Varicose veins in the anterior left thigh. IMPRESSION: 1. No fracture or dislocation. 2. Minimal osteoarthritis of the left  hip. Electronically Signed   By: Lorriane Shire M.D.   On: 05/15/2018 09:23   Dg Knee Complete 4 Views Left  Result Date: 05/15/2018 CLINICAL DATA:  Patient status post fall. EXAM: LEFT KNEE - COMPLETE 4+ VIEW COMPARISON:  None. FINDINGS: No evidence of fracture, dislocation, or joint effusion. No evidence of arthropathy or other focal bone abnormality. Soft  tissues are unremarkable. IMPRESSION: No acute osseous abnormality. Electronically Signed   By: Lovey Newcomer M.D.   On: 05/15/2018 09:12   Dg Hip Unilat W Or Wo Pelvis 2-3 Views Left  Result Date: 05/15/2018 CLINICAL DATA:  Patient status post fall.  Left leg pain. EXAM: DG HIP (WITH OR WITHOUT PELVIS) 2-3V LEFT COMPARISON:  Left hip radiograph 03/25/2017 FINDINGS: Osseous demineralization. SI joints are unremarkable. Lumbar spine degenerative changes. Vascular calcifications. Mild bilateral hip joint degenerative changes. No acute displaced left-sided hip fracture. IMPRESSION: No acute osseous abnormality. Electronically Signed   By: Lovey Newcomer M.D.   On: 05/15/2018 08:32    Procedures Procedures (including critical care time)  Medications Ordered in ED Medications - No data to display   Initial Impression / Assessment and Plan / ED Course  I have reviewed the triage vital signs and the nursing notes.  Pertinent labs & imaging results that were available during my care of the patient were reviewed by me and considered in my medical decision making (see chart for details).    Valerie Terrell is a 84 y.o. female who presents to ED for left hip and knee pain after mechanical fall yesterday am. Tender to palpation of left hip and knee. Initial x-rays negative. CT hip then obtained which was also without acute findings. Given amount of pain, planned to order MRI, but unfortunately, patient has pacemaker and we were unable to have MRI performed in ED. Discussed case with ortho, Dr. Alvan Dame, who stated that given reassuring CT, if there was an acute injury, it would likely be non-operative.  Recommended admission to medicine for PT/OT with touchdown weightbearing and orthopedic service will follow in consultation.  Discussed this plan with the patient who did not want admission.  She states that she has a walker at home and can get around safely.  We specifically discussed that given her injury, she is at  much higher risk of falling and having further, more debilitating injuries, however she is quite adamant that she would like to go home.  Daughter at bedside lives with her and states that she will keep a very close eye on her.  If these are her mother's wishes, she would like to honor them and stay out of the hospital if at all possible.  Patient did ambulate with a walker here in the emergency department.  Updated Dr. Alvan Dame on patient's plan and he will see her in the office in a couple of weeks.  I discussed reasons to return to the ER with patient and daughter.  All questions were answered.  Patient seen by and discussed with Dr. Zenia Resides who agrees with treatment plan.    Final Clinical Impressions(s) / ED Diagnoses   Final diagnoses:  Fall, initial encounter  Left hip pain    ED Discharge Orders    None       Ward, Ozella Almond, PA-C 05/15/18 1132    Lacretia Leigh, MD 05/16/18 1459

## 2018-05-15 NOTE — Discharge Instructions (Signed)
It was my pleasure taking care of you today!   Please call the orthopedist doctor listed tomorrow morning to schedule a follow up appointment.   Return to ER for new or worsening symptoms, any additional concerns.

## 2018-05-15 NOTE — ED Notes (Signed)
Patient transported to CT 

## 2018-05-16 ENCOUNTER — Encounter: Payer: Self-pay | Admitting: Family Medicine

## 2018-05-16 DIAGNOSIS — D509 Iron deficiency anemia, unspecified: Secondary | ICD-10-CM | POA: Diagnosis not present

## 2018-05-16 DIAGNOSIS — E876 Hypokalemia: Secondary | ICD-10-CM | POA: Diagnosis not present

## 2018-05-16 DIAGNOSIS — N2581 Secondary hyperparathyroidism of renal origin: Secondary | ICD-10-CM | POA: Diagnosis not present

## 2018-05-16 DIAGNOSIS — D689 Coagulation defect, unspecified: Secondary | ICD-10-CM | POA: Diagnosis not present

## 2018-05-16 DIAGNOSIS — N186 End stage renal disease: Secondary | ICD-10-CM | POA: Diagnosis not present

## 2018-05-17 ENCOUNTER — Encounter: Payer: Self-pay | Admitting: Cardiology

## 2018-05-17 ENCOUNTER — Ambulatory Visit (INDEPENDENT_AMBULATORY_CARE_PROVIDER_SITE_OTHER): Payer: Medicare Other | Admitting: Cardiology

## 2018-05-17 ENCOUNTER — Ambulatory Visit (INDEPENDENT_AMBULATORY_CARE_PROVIDER_SITE_OTHER): Payer: Medicare Other | Admitting: Pharmacist Clinician (PhC)/ Clinical Pharmacy Specialist

## 2018-05-17 VITALS — BP 108/54 | HR 72 | Ht 59.0 in

## 2018-05-17 DIAGNOSIS — I4891 Unspecified atrial fibrillation: Secondary | ICD-10-CM

## 2018-05-17 DIAGNOSIS — N186 End stage renal disease: Secondary | ICD-10-CM

## 2018-05-17 DIAGNOSIS — I5042 Chronic combined systolic (congestive) and diastolic (congestive) heart failure: Secondary | ICD-10-CM | POA: Diagnosis not present

## 2018-05-17 DIAGNOSIS — I480 Paroxysmal atrial fibrillation: Secondary | ICD-10-CM

## 2018-05-17 DIAGNOSIS — Z992 Dependence on renal dialysis: Secondary | ICD-10-CM

## 2018-05-17 DIAGNOSIS — Z7901 Long term (current) use of anticoagulants: Secondary | ICD-10-CM | POA: Diagnosis not present

## 2018-05-17 DIAGNOSIS — I120 Hypertensive chronic kidney disease with stage 5 chronic kidney disease or end stage renal disease: Secondary | ICD-10-CM | POA: Diagnosis not present

## 2018-05-17 DIAGNOSIS — Z8673 Personal history of transient ischemic attack (TIA), and cerebral infarction without residual deficits: Secondary | ICD-10-CM

## 2018-05-17 DIAGNOSIS — Z950 Presence of cardiac pacemaker: Secondary | ICD-10-CM

## 2018-05-17 DIAGNOSIS — I10 Essential (primary) hypertension: Secondary | ICD-10-CM

## 2018-05-17 DIAGNOSIS — Z8679 Personal history of other diseases of the circulatory system: Secondary | ICD-10-CM

## 2018-05-17 DIAGNOSIS — I259 Chronic ischemic heart disease, unspecified: Secondary | ICD-10-CM | POA: Diagnosis not present

## 2018-05-17 DIAGNOSIS — I428 Other cardiomyopathies: Secondary | ICD-10-CM

## 2018-05-17 LAB — POCT INR: INR: 2.4 (ref 2.0–3.0)

## 2018-05-17 NOTE — Assessment & Plan Note (Signed)
On HD [facility]) T-Th-Sat. Dr Lorrene Reid follows

## 2018-05-17 NOTE — Assessment & Plan Note (Signed)
Controlled.  

## 2018-05-17 NOTE — Assessment & Plan Note (Signed)
CHADs VASc=5 

## 2018-05-17 NOTE — Assessment & Plan Note (Signed)
Stable since dialysis started

## 2018-05-17 NOTE — Assessment & Plan Note (Signed)
Echo shows EF 35-40% July 2018 Echo shows EF 50-55% July 2019

## 2018-05-17 NOTE — Patient Instructions (Signed)
Medication Instructions:  Your physician recommends that you continue on your current medications as directed. Please refer to the Current Medication list given to you today. If you need a refill on your cardiac medications before your next appointment, please call your pharmacy.   Lab work: None  If you have labs (blood work) drawn today and your tests are completely normal, you will receive your results only by: [name] MyChart Message (if you have MyChart) OR . A paper copy in the mail If you have any lab test that is abnormal or we need to change your treatment, we will call you to review the results.  Testing/Procedures: None   Follow-Up: At [facility], you and your health needs are our priority.  As part of our continuing mission to provide you with exceptional heart care, we have created designated Provider Care Teams.  These Care Teams include your primary Cardiologist (physician) and Advanced Practice Providers (APPs -  Physician Assistants and Nurse Practitioners) who all work together to provide you with the care you need, when you need it. You will need a follow up appointment in 6 months.  Please call our office 2 months in advance to schedule this appointment.  You may see Quay Burow, MD or one of the following Advanced Practice Providers on your designated Care Team:   Kerin Ransom, PA-C Roby Lofts, [place] . Sande Rives, PA-C  Any Other Special Instructions Will Be Listed Below (If Applicable).

## 2018-05-17 NOTE — Progress Notes (Signed)
05/17/2018 Valerie Terrell   04-02-1933  [id number]  Primary Physician [place], Adrian Blackwater, MD Primary Cardiologist: dr Gwenlyn Found  HPI:  Pleasant 84 y/o female with a history of PAF. She had been placed on Xarelto in the past but apparently had orthostatic syncope and suffered a "tiny" [place] in 2016 after a fall and Xarelto was discontinued and she as placed on ASA.. She presented 01/30/16 with a retinal artery occlusion and evidence of bilateral micro emboli on MRI c/w embolic stroke.  After consultation with the Neurologist she was placed on Eliquis 5 mg BID.  During thathospitalization she had also complained of chest pain and had a slightly elevated Troponin.  Echo and Myoview were normal.  In July 2018 she was admitted and had a St Jude pacemaker placed for PAF and tachy-brady syndrome.  She has had labile HTN and renal artery stenosis and in Sept 2018 she started dialysis-T/Th/Sat.  Eliquis was changed to Coumadin.  In July 2018 her EF was 35-40% but in July 2019 this had improved to 50-55%.   She is in the office today for routine follow-up.  Her daughter Valerie Terrell accompanied her.  Valerie Terrell is living with her at home.  The patient is been doing well since she started dialysis.  She did have a fall recently and had to go to the emergency room.  She injured her left hip, CT scan did not show a fracture.  They considered an MRI but with her pacemaker they decided to not do this.  From a cardiac standpoint she is not had increased shortness of breath and her lower extremity edema is stable.  She is not had tachycardia.  Her pacemaker was checked in July and was functioning normally.     Current Outpatient Medications  Medication Sig Dispense Refill  . acetaminophen (TYLENOL) 500 MG tablet Take 500-1,000 mg 3 (three) times daily as needed by mouth for moderate pain.     [name] amiodarone (PACERONE) 200 MG tablet TAKE 1 TABLET BY MOUTH  DAILY (Patient taking differently: Take 200 mg by mouth daily. ) 90  tablet 2  . carvedilol (COREG) 3.125 MG tablet Take 1 tablet (3.125 mg total) by mouth 2 (two) times daily. 30 tablet 0  . HYDROcodone-acetaminophen (NORCO) 5-325 MG tablet Take 1 tablet by mouth every 6 (six) hours as needed for moderate pain or severe pain. 60 tablet 0  . isosorbide mononitrate (IMDUR) 60 MG 24 hr tablet TAKE 1 TABLET BY MOUTH  DAILY (Patient taking differently: Take 60 mg by mouth daily. ) 90 tablet 1  . ondansetron (ZOFRAN) 4 MG tablet Take 0.5-1 tablets (2-4 mg total) by mouth every 8 (eight) hours as needed for nausea or vomiting. 90 tablet 3  . polyethylene glycol (MIRALAX / GLYCOLAX) packet Take 17 g by mouth daily as needed for mild constipation.    . pravastatin (PRAVACHOL) 40 MG tablet TAKE 1 TABLET BY MOUTH  EVERY EVENING (Patient taking differently: Take 40 mg by mouth daily. ) 90 tablet 1  . sevelamer carbonate (RENVELA) 800 MG tablet Take 800 mg by mouth See admin instructions. Two to three times daily with meals    . vitamin C (ASCORBIC ACID) 500 MG tablet Take 500 mg by mouth daily.    . Vitamin D, Ergocalciferol, (DRISDOL) 1.25 MG (50000 UT) CAPS capsule Take 1 capsule (50,000 Units total) by mouth every Thursday. 12 capsule 3  . warfarin (COUMADIN) 3 MG tablet TAKE 1/2 TO 1 TABLET BY  MOUTH DAILY  AS DIRECTED BY  COUMADIN CLINIC (Patient taking differently: Take 1.5-3 mg by mouth See admin instructions. Take 0.5 tablet (1.5 mg totally) by mouth on Mon, Wed, and Friday; Take 1 tablet (3 mg totally) by mouth on Mon, Wed, and Friday) 90 tablet 1   No current facility-administered medications for this visit.     Allergies  Allergen Reactions  . Clonidine Derivatives Palpitations and Other (See Comments)    VERY SEDATED  . Adhesive [Tape] Other (See Comments)    Tears skin up  . Codeine Nausea Only  . Nickel Rash  . Sulfa Antibiotics Other (See Comments)    Reaction unknown    Past Medical History:  Diagnosis Date  . Anemia of chronic disease 2018   +  anemia of CRI?  [name] Atrial fibrillation ([place]) [I48.91] 12/11/2016  . Atrophy of left kidney    with absent blood flow by renal artery dopplers (Dr. Gwenlyn Found)  . 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 Chronic combined systolic and diastolic CHF (congestive heart failure) ([place])    a. 11/2016: echo showing EF of 35-40%, RV strain noted, mild MR and mild TR.   Echo showed much improved EF 12/2017 (55-60%)  . Chronic respiratory failure with hypoxia ([place]) 02/11/2017   Now on chronic O2 (intermittent as of summer 2019).    . Debilitated patient    WC dependent as of 2018  . ESRD on hemodialysis ([place]) 01/2017   T/Th/Sat schedule- [place].  Right basilic AV fistula 16/1096--EAV unable to successfully access this.  Dr. Donnetta Hutching to do an upper arm AV graft as of 10/2017..  . History of adenomatous polyp of colon   . History of kidney stones    passed  . History of subarachnoid hemorrhage 10/2014   after syncope and while on xarelto  . HOH (hard of hearing)   . Hyperlipidemia   . Hypertension    Difficult to control, in the setting of one functioning kidney: pt was referred to nephrology by Dr. Gwenlyn Found 06/2015.  . Lumbar radiculopathy 2012  . Lumbar spinal stenosis 2019   facet injections only very short term relief.  L1 and L2 selective nerve root blocks helpful 04/2018.  . Lumbar spondylosis    MR 07/2016---no sign of spinal nerve compression or cord compression.  Pt set up with outpt ortho while admitted to hosp 07/2016.  . Malnourished ([place])   . Metatarsal fracture 06/10/2016   Nondisplaced, left 5th metatarsal--pt was referred to ortho  . Nonischemic cardiomyopathy ([place])    EF improved to 55-60% on echo 12/2017  . Osteopenia 2014   T-score -2.1  . PAF (paroxysmal atrial fibrillation) (HCC)    Eliquis started after BRAO and CVA.  She was changed to warfarin 2018--INR/coumadin mgmt per HD/nephrol.  . Presence of permanent cardiac pacemaker   . Right rib fracture 12/2016   s/p fall    . Sick sinus syndrome ([place]) 11/2016   Dual chamber pacer insertion 2018 (Dr. Lovena Le)  . Stroke ([place]) 40/9811   cardioembolic (had CVA while on no anticoag)--"scattered subacute punctate infarcts: 1 in R parietal lobe and 2 in occipital cortex" on MRI br.  CT angio head/neck: aortic arch athero.  R ICA 20% stenosis, L ICA w/out any stenosis.  . Thoracic back pain 01/2017; 01/2018   2018: Facet?  Dr. Ramos->steroid injection.  01/2018-->CT T spine to check for a new comp fx-->none found.  Selective nerve root inj in T spine helpful on R side.   [name]  Thoracic compression fracture ([place]) 07/2016   T3.  T7 and T8-- T8 kyphoplasty during hosp admission 07/2016.  Neuro referred pt to pain mgmt for consideration of injection 09/2016.  I referred her to endo 08/2016 for consideration of calcitonin treatment.    Social History   Socioeconomic History  . Marital status: Widowed    Spouse name: Not on file  . Number of children: Not on file  . Years of education: Not on file  . Highest education level: Not on file  Occupational History  . Not on file  Social Needs  . Financial resource strain: Not on file  . Food insecurity:    Worry: Not on file    Inability: Not on file  . Transportation needs:    Medical: Not on file    Non-medical: Not on file  Tobacco Use  . Smoking status: Former Smoker    Years: 22.00    Last attempt to quit: 03/17/1972    Years since quitting: 46.1  . Smokeless tobacco: Never Used  Substance and Sexual Activity  . Alcohol use: Yes    Alcohol/week: 1.0 standard drinks    Types: 1 Glasses of wine per week    Comment: wine  . Drug use: No  . Sexual activity: Never  Lifestyle  . Physical activity:    Days per week: Not on file    Minutes per session: Not on file  . Stress: Not on file  Relationships  . Social connections:    Talks on phone: Not on file    Gets together: Not on file    Attends religious service: Not on file    Active member of club or organization:  Not on file    Attends meetings of clubs or organizations: Not on file    Relationship status: Not on file  . Intimate partner violence:    Fear of current or ex partner: Not on file    Emotionally abused: Not on file    Physically abused: Not on file    Forced sexual activity: Not on file  Other Topics Concern  . Not on file  Social History Narrative   Widow.  One daughter, lives with her.   Educ: college   Occup: retired Marine scientist.   No T/A/Ds.   She is almost a vegetarian.     Family History  Problem Relation Age of Onset  . Cancer Mother   . Heart disease Father   . Early death Father   . Sudden Cardiac Death Neg Hx      Review of Systems: General: negative for chills, fever, night sweats or weight changes.  Cardiovascular: negative for chest pain, dyspnea on exertion, edema, orthopnea, palpitations, paroxysmal nocturnal dyspnea or shortness of breath Dermatological: negative for rash Respiratory: negative for cough or wheezing Urologic: negative for hematuria Abdominal: negative for nausea, vomiting, diarrhea, bright red blood per rectum, melena, or hematemesis Neurologic: negative for visual changes, syncope, or dizziness All other systems reviewed and are otherwise negative except as noted above.    Blood pressure (!) 108/54, pulse 72, height 4\' 11"  (1.499 m).  General appearance: alert, cooperative, appears stated age, no distress and in wheel chair Neck: no JVD and bilateral tarsmitted carotid murmur Lungs: clear to auscultation bilaterally Heart: regular rate and rhythm and 2/6 systolic murmur AOV and LSB Extremities: trace LE edema Skin: pale, cool, dry Neurologic: Grossly normal   ASSESSMENT AND PLAN:   Chronic combined systolic and diastolic heart failure (HCC) Stable  since dialysis started  Essential hypertension Controlled  ESRD on dialysis [facility]) On HD [facility]) T-Th-Sat. Dr Lorrene Reid follows  Non-ischemic cardiomyopathy [facility]) Echo  shows EF 35-40% July 2018 Echo shows EF 50-55% July 2019  PAF (paroxysmal atrial fibrillation) ([place]) CHADs VASc= 5  Long term (current) use of anticoagulants [Z79.01] Coumadin Rx now that she is on HD- followed by [facility]  She has what sounds like mild AS though this could be from her RUE AVF. Echo in 2019 did not show any AS, just thickened AOV.  Carotid dopplers in 2017 at [place] did not show any carotid stenosis.  F/U with Dr Gwenlyn Found in 6 months- no change in Rx.   Kerin Ransom PA-C 05/17/2018 8:32 AM

## 2018-05-17 NOTE — Assessment & Plan Note (Signed)
Coumadin Rx now that she is on HD- followed by [facility]

## 2018-05-19 ENCOUNTER — Telehealth: Payer: Self-pay | Admitting: Family Medicine

## 2018-05-19 DIAGNOSIS — D689 Coagulation defect, unspecified: Secondary | ICD-10-CM | POA: Diagnosis not present

## 2018-05-19 DIAGNOSIS — D509 Iron deficiency anemia, unspecified: Secondary | ICD-10-CM | POA: Diagnosis not present

## 2018-05-19 DIAGNOSIS — N2581 Secondary hyperparathyroidism of renal origin: Secondary | ICD-10-CM | POA: Diagnosis not present

## 2018-05-19 DIAGNOSIS — E876 Hypokalemia: Secondary | ICD-10-CM | POA: Diagnosis not present

## 2018-05-19 DIAGNOSIS — N186 End stage renal disease: Secondary | ICD-10-CM | POA: Diagnosis not present

## 2018-05-19 MED ORDER — OXYCODONE HCL 5 MG PO TABS: ORAL_TABLET | ORAL | 0 refills | Status: DC

## 2018-05-19 NOTE — Telephone Encounter (Signed)
Copied from [place] [id number]. Topic: Quick Communication - Rx Refill/Question >> May 19, 2018 10:29 AM Antonieta Iba C wrote: Medication: oxyCODONE-acetaminophen (PERCOCET/ROXICET) 5-325 MG per tablet 1 tablet   Has the patient contacted their pharmacy? Yes   (Agent: If no, request that the patient contact the pharmacy for the refill.) (Agent: If yes, when and what did the pharmacy advise?)  Preferred Pharmacy (with phone number or street name): [id number] [place], [place] [phone] (Phone) [phone] (Fax)    Agent: Please be advised that RX refills may take up to 3 business days. We ask that you follow-up with your pharmacy.

## 2018-05-19 NOTE — Telephone Encounter (Signed)
Called pt  And left message to verify what medication is needing refill. Pt does not oxycodone-acetaminophen on her profile. She does however have hydrocone-acetaminophen. Asked pt to call back to clarify.

## 2018-05-19 NOTE — Telephone Encounter (Signed)
Please advise. Thanks.  

## 2018-05-19 NOTE — Telephone Encounter (Signed)
Oxycodone eRx'd, #30. Need to see her for pain follow up if pain not significantly improved by the time she runs out of these, earlier if pain gets worse or is not improving with the oxycodone.-thx

## 2018-05-19 NOTE — Telephone Encounter (Signed)
Pt advised and voiced understanding.   

## 2018-05-19 NOTE — Telephone Encounter (Signed)
Pt called back stating she has 2 pills of hydrocodone-acetaminophen and is taking every 4-6 hours for hip pain. She had a fall and went to the ED and was told there is no fracture but she has a lot of pain. Pt states the hydrocodone is not helping and Dr. Anitra Lauth previously ordered oxyCODONE-acetaminophen (PERCOCET/ROXICET) 5-325 MG tablet for her in Nov 2018. She would like to have the oxycodone as it is more effective for pain.  [id number] - [place], [place] (Phone) [phone] (Fax)

## 2018-05-20 ENCOUNTER — Observation Stay (HOSPITAL_COMMUNITY)
Admission: EM | Admit: 2018-05-20 | Discharge: 2018-05-22 | Disposition: A | Discharge status: Home / Self Care | Payer: Medicare Other | Attending: Internal Medicine | Admitting: Internal Medicine

## 2018-05-20 ENCOUNTER — Emergency Department (HOSPITAL_COMMUNITY): Payer: Medicare Other

## 2018-05-20 ENCOUNTER — Encounter (HOSPITAL_COMMUNITY): Payer: Self-pay

## 2018-05-20 DIAGNOSIS — Z882 Allergy status to sulfonamides status: Secondary | ICD-10-CM | POA: Diagnosis not present

## 2018-05-20 DIAGNOSIS — Z9981 Dependence on supplemental oxygen: Secondary | ICD-10-CM | POA: Diagnosis not present

## 2018-05-20 DIAGNOSIS — W19XXXA Unspecified fall, initial encounter: Secondary | ICD-10-CM

## 2018-05-20 DIAGNOSIS — I132 Hypertensive heart and chronic kidney disease with heart failure and with stage 5 chronic kidney disease, or end stage renal disease: Secondary | ICD-10-CM | POA: Insufficient documentation

## 2018-05-20 DIAGNOSIS — Z8679 Personal history of other diseases of the circulatory system: Secondary | ICD-10-CM

## 2018-05-20 DIAGNOSIS — R5381 Other malaise: Secondary | ICD-10-CM | POA: Insufficient documentation

## 2018-05-20 DIAGNOSIS — Z950 Presence of cardiac pacemaker: Secondary | ICD-10-CM | POA: Diagnosis present

## 2018-05-20 DIAGNOSIS — Z8673 Personal history of transient ischemic attack (TIA), and cerebral infarction without residual deficits: Secondary | ICD-10-CM | POA: Diagnosis not present

## 2018-05-20 DIAGNOSIS — Z7901 Long term (current) use of anticoagulants: Secondary | ICD-10-CM | POA: Diagnosis not present

## 2018-05-20 DIAGNOSIS — I5042 Chronic combined systolic (congestive) and diastolic (congestive) heart failure: Secondary | ICD-10-CM | POA: Diagnosis present

## 2018-05-20 DIAGNOSIS — I480 Paroxysmal atrial fibrillation: Secondary | ICD-10-CM | POA: Diagnosis present

## 2018-05-20 DIAGNOSIS — N2581 Secondary hyperparathyroidism of renal origin: Secondary | ICD-10-CM | POA: Diagnosis not present

## 2018-05-20 DIAGNOSIS — G8929 Other chronic pain: Secondary | ICD-10-CM | POA: Diagnosis not present

## 2018-05-20 DIAGNOSIS — E8889 Other specified metabolic disorders: Secondary | ICD-10-CM | POA: Insufficient documentation

## 2018-05-20 DIAGNOSIS — J9621 Acute and chronic respiratory failure with hypoxia: Secondary | ICD-10-CM | POA: Diagnosis not present

## 2018-05-20 DIAGNOSIS — M25552 Pain in left hip: Secondary | ICD-10-CM | POA: Diagnosis not present

## 2018-05-20 DIAGNOSIS — N186 End stage renal disease: Secondary | ICD-10-CM

## 2018-05-20 DIAGNOSIS — Z87891 Personal history of nicotine dependence: Secondary | ICD-10-CM | POA: Insufficient documentation

## 2018-05-20 DIAGNOSIS — Z992 Dependence on renal dialysis: Secondary | ICD-10-CM | POA: Insufficient documentation

## 2018-05-20 DIAGNOSIS — E785 Hyperlipidemia, unspecified: Secondary | ICD-10-CM | POA: Insufficient documentation

## 2018-05-20 DIAGNOSIS — R531 Weakness: Secondary | ICD-10-CM | POA: Diagnosis present

## 2018-05-20 DIAGNOSIS — I10 Essential (primary) hypertension: Secondary | ICD-10-CM | POA: Diagnosis present

## 2018-05-20 DIAGNOSIS — S299XXA Unspecified injury of thorax, initial encounter: Secondary | ICD-10-CM | POA: Diagnosis not present

## 2018-05-20 DIAGNOSIS — R0902 Hypoxemia: Secondary | ICD-10-CM | POA: Diagnosis not present

## 2018-05-20 DIAGNOSIS — F329 Major depressive disorder, single episode, unspecified: Secondary | ICD-10-CM | POA: Insufficient documentation

## 2018-05-20 DIAGNOSIS — D631 Anemia in chronic kidney disease: Secondary | ICD-10-CM | POA: Diagnosis not present

## 2018-05-20 DIAGNOSIS — I120 Hypertensive chronic kidney disease with stage 5 chronic kidney disease or end stage renal disease: Secondary | ICD-10-CM | POA: Diagnosis not present

## 2018-05-20 DIAGNOSIS — S79912A Unspecified injury of left hip, initial encounter: Secondary | ICD-10-CM | POA: Diagnosis not present

## 2018-05-20 LAB — CBC WITH DIFFERENTIAL/PLATELET
Abs Immature Granulocytes: 0.05 10*3/uL (ref 0.00–0.07)
BASOS ABS: 0 10*3/uL (ref 0.0–0.1)
BASOS PCT: 0 %
EOS ABS: 0.1 10*3/uL (ref 0.0–0.5)
EOS PCT: 1 %
HCT: 33.8 % — ABNORMAL LOW (ref 36.0–46.0)
Hemoglobin: 10.8 g/dL — ABNORMAL LOW (ref 12.0–15.0)
Immature Granulocytes: 1 %
Lymphocytes Relative: 11 %
Lymphs Abs: 0.7 10*3/uL (ref 0.7–4.0)
MCH: 33.2 pg (ref 26.0–34.0)
MCHC: 32 g/dL (ref 30.0–36.0)
MCV: 104 fL — ABNORMAL HIGH (ref 80.0–100.0)
Monocytes Absolute: 1.5 10*3/uL — ABNORMAL HIGH (ref 0.1–1.0)
Monocytes Relative: 22 %
NRBC: 0 % (ref 0.0–0.2)
Neutro Abs: 4.3 10*3/uL (ref 1.7–7.7)
Neutrophils Relative %: 65 %
Platelets: 172 10*3/uL (ref 150–400)
RBC: 3.25 MIL/uL — ABNORMAL LOW (ref 3.87–5.11)
RDW: 13.8 % (ref 11.5–15.5)
WBC: 6.6 10*3/uL (ref 4.0–10.5)

## 2018-05-20 LAB — BASIC METABOLIC PANEL
Anion gap: 15 (ref 5–15)
BUN: 14 mg/dL (ref 8–23)
CALCIUM: 8.2 mg/dL — AB (ref 8.9–10.3)
CHLORIDE: 92 mmol/L — AB (ref 98–111)
CO2: 30 mmol/L (ref 22–32)
CREATININE: 4 mg/dL — AB (ref 0.44–1.00)
GFR, EST AFRICAN AMERICAN: 11 mL/min — AB (ref >60)
GFR, EST NON AFRICAN AMERICAN: 10 mL/min — AB (ref >60)
Glucose, Bld: 88 mg/dL (ref 70–99)
Potassium: 3.6 mmol/L (ref 3.5–5.1)
SODIUM: 137 mmol/L (ref 135–145)

## 2018-05-20 LAB — MRSA PCR SCREENING: MRSA by PCR: NEGATIVE

## 2018-05-20 MED ORDER — OXYCODONE HCL 5 MG PO TABS
ORAL_TABLET | ORAL | Status: AC
  Filled 2018-05-20: qty 2

## 2018-05-20 MED ORDER — ONDANSETRON HCL 4 MG PO TABS: 4.0000 mg | ORAL_TABLET | Freq: Four times a day (QID) | ORAL | Status: DC | PRN

## 2018-05-20 MED ORDER — SODIUM CHLORIDE 0.9 % IV SOLN: 100.0000 mL | INTRAVENOUS | Status: DC | PRN

## 2018-05-20 MED ORDER — LIDOCAINE HCL (PF) 1 % IJ SOLN: 5.0000 mL | INTRAMUSCULAR | Status: DC | PRN

## 2018-05-20 MED ORDER — WARFARIN SODIUM 1 MG PO TABS
1.5000 mg | ORAL_TABLET | ORAL | Status: DC
  Administered 2018-05-20: 1.5 mg via ORAL
  Filled 2018-05-20 (×2): qty 1

## 2018-05-20 MED ORDER — LIDOCAINE-PRILOCAINE 2.5-2.5 % EX CREA
1.0000 "application " | TOPICAL_CREAM | CUTANEOUS | Status: DC | PRN
  Filled 2018-05-20: qty 5

## 2018-05-20 MED ORDER — VITAMIN C 500 MG PO TABS
500.0000 mg | ORAL_TABLET | Freq: Every day | ORAL | Status: DC
  Administered 2018-05-21 – 2018-05-22 (×2): 500 mg via ORAL
  Filled 2018-05-20 (×2): qty 1

## 2018-05-20 MED ORDER — ISOSORBIDE MONONITRATE ER 60 MG PO TB24
60.0000 mg | ORAL_TABLET | Freq: Every day | ORAL | Status: DC
  Administered 2018-05-21 – 2018-05-22 (×2): 60 mg via ORAL
  Filled 2018-05-20 (×2): qty 1

## 2018-05-20 MED ORDER — AMIODARONE HCL 200 MG PO TABS
200.0000 mg | ORAL_TABLET | Freq: Every day | ORAL | Status: DC
  Administered 2018-05-21 – 2018-05-22 (×2): 200 mg via ORAL
  Filled 2018-05-20 (×2): qty 1

## 2018-05-20 MED ORDER — SODIUM CHLORIDE 0.9% FLUSH
3.0000 mL | Freq: Two times a day (BID) | INTRAVENOUS | Status: DC
  Administered 2018-05-20 – 2018-05-21 (×3): 3 mL via INTRAVENOUS

## 2018-05-20 MED ORDER — ONDANSETRON HCL 4 MG/2ML IJ SOLN: 4.0000 mg | Freq: Four times a day (QID) | INTRAMUSCULAR | Status: DC | PRN

## 2018-05-20 MED ORDER — OXYCODONE HCL 5 MG PO TABS
5.0000 mg | ORAL_TABLET | Freq: Once | ORAL | Status: AC
  Administered 2018-05-20: 5 mg via ORAL
  Filled 2018-05-20: qty 1

## 2018-05-20 MED ORDER — ACETAMINOPHEN 500 MG PO TABS
500.0000 mg | ORAL_TABLET | Freq: Three times a day (TID) | ORAL | Status: DC | PRN
  Administered 2018-05-21: 1000 mg via ORAL

## 2018-05-20 MED ORDER — VITAMIN D (ERGOCALCIFEROL) 1.25 MG (50000 UNIT) PO CAPS: 50000.0000 [IU] | ORAL_CAPSULE | ORAL | Status: DC

## 2018-05-20 MED ORDER — SODIUM CHLORIDE 0.9 % IV SOLN: 250.0000 mL | INTRAVENOUS | Status: DC | PRN

## 2018-05-20 MED ORDER — CARVEDILOL 3.125 MG PO TABS
3.1250 mg | ORAL_TABLET | Freq: Two times a day (BID) | ORAL | Status: DC
  Administered 2018-05-20 – 2018-05-22 (×3): 3.125 mg via ORAL
  Filled 2018-05-20 (×4): qty 1

## 2018-05-20 MED ORDER — HEPARIN SODIUM (PORCINE) 1000 UNIT/ML IJ SOLN
INTRAMUSCULAR | Status: AC
  Administered 2018-05-20: 1100 [IU] via INTRAVENOUS_CENTRAL
  Filled 2018-05-20: qty 2

## 2018-05-20 MED ORDER — SEVELAMER CARBONATE 800 MG PO TABS
800.0000 mg | ORAL_TABLET | Freq: Three times a day (TID) | ORAL | Status: DC
  Administered 2018-05-21: 800 mg via ORAL
  Filled 2018-05-20 (×2): qty 1

## 2018-05-20 MED ORDER — PENTAFLUOROPROP-TETRAFLUOROETH EX AERO
1.0000 "application " | INHALATION_SPRAY | CUTANEOUS | Status: DC | PRN
  Filled 2018-05-20: qty 30

## 2018-05-20 MED ORDER — WARFARIN SODIUM 3 MG PO TABS
3.0000 mg | ORAL_TABLET | ORAL | Status: DC
  Filled 2018-05-20: qty 1

## 2018-05-20 MED ORDER — PRAVASTATIN SODIUM 40 MG PO TABS
40.0000 mg | ORAL_TABLET | Freq: Every day | ORAL | Status: DC
  Administered 2018-05-20 – 2018-05-22 (×3): 40 mg via ORAL
  Filled 2018-05-20 (×3): qty 1

## 2018-05-20 MED ORDER — POLYETHYLENE GLYCOL 3350 17 G PO PACK
17.0000 g | PACK | Freq: Every day | ORAL | Status: DC | PRN
  Administered 2018-05-20: 17 g via ORAL
  Filled 2018-05-20: qty 1

## 2018-05-20 MED ORDER — CHLORHEXIDINE GLUCONATE CLOTH 2 % EX PADS: 6.0000 | MEDICATED_PAD | Freq: Every day | CUTANEOUS | Status: DC

## 2018-05-20 MED ORDER — SODIUM CHLORIDE 0.9% FLUSH: 3.0000 mL | INTRAVENOUS | Status: DC | PRN

## 2018-05-20 MED ORDER — HEPARIN SODIUM (PORCINE) 1000 UNIT/ML DIALYSIS
1000.0000 [IU] | INTRAMUSCULAR | Status: DC | PRN
  Filled 2018-05-20: qty 1

## 2018-05-20 MED ORDER — ONDANSETRON HCL 4 MG PO TABS: 2.0000 mg | ORAL_TABLET | Freq: Three times a day (TID) | ORAL | Status: DC | PRN

## 2018-05-20 MED ORDER — HEPARIN SODIUM (PORCINE) 1000 UNIT/ML DIALYSIS
20.0000 [IU]/kg | INTRAMUSCULAR | Status: DC | PRN
  Administered 2018-05-20: 1100 [IU] via INTRAVENOUS_CENTRAL
  Filled 2018-05-20 (×2): qty 2

## 2018-05-20 MED ORDER — ALTEPLASE 2 MG IJ SOLR
2.0000 mg | Freq: Once | INTRAMUSCULAR | Status: DC | PRN
  Filled 2018-05-20: qty 2

## 2018-05-20 MED ORDER — WARFARIN SODIUM 3 MG PO TABS: 1.5000 mg | ORAL_TABLET | ORAL | Status: DC

## 2018-05-20 MED ORDER — OXYCODONE HCL 5 MG PO TABS
10.0000 mg | ORAL_TABLET | ORAL | Status: DC | PRN
  Administered 2018-05-20 – 2018-05-22 (×4): 10 mg via ORAL
  Filled 2018-05-20 (×3): qty 2

## 2018-05-20 MED ORDER — WARFARIN - PHYSICIAN DOSING INPATIENT: Freq: Every day | Status: DC

## 2018-05-20 NOTE — H&P (Signed)
History and Physical    Valerie Terrell ZYS:[id number] DOB: Sep 24, 1932 DOA: 05/20/2018  PCP: Tammi Sou, MD  Patient coming from: Home  Chief Complaint: Left hip pain  HPI: Valerie Terrell is a 85 y.o. female with medical history significant of end-stage renal disease on dialysis Monday Wednesday Friday, chronic respiratory failure comes in with continue left hip pain has had a CT of her hip and x-rays which have been negative for fractures.  Patient fell a couple days ago and has been having hip pain since.  She is on Vicodin 5 mg orally at home has been taking this twice a day without any relief of her pain.  She cannot walk.  Patient has been set up to get daily dialysis as an outpatient with nephrology however family was unable to get her to dialysis this morning because she cannot get up and move around and put weight on her hip.  Patient has been referred for admission for inability to walk and the need for dialysis.  She is reportedly to be 84% on room air but is on home oxygen.  She denies any shortness of breath or swelling.  Review of Systems: As per HPI otherwise 10 point review of systems negative.   Past Medical History:  Diagnosis Date  . Anemia of chronic disease 2018   + anemia of CRI?  [name] Atrial fibrillation ([place]) [I48.91] 12/11/2016  . Atrophy of left kidney    with absent blood flow by renal artery dopplers (Dr. Gwenlyn Found)  . 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 Chronic combined systolic and diastolic CHF (congestive heart failure) ([place])    a. 11/2016: echo showing EF of 35-40%, RV strain noted, mild MR and mild TR.   Echo showed much improved EF 12/2017 (55-60%)  . Chronic respiratory failure with hypoxia ([place]) 02/11/2017   Now on chronic O2 (intermittent as of summer 2019).    . Debilitated patient    WC dependent as of 2018  . ESRD on hemodialysis ([place]) 01/2017   T/Th/Sat schedule- [place].  Right basilic AV fistula 93/2355--DDU unable to  successfully access this.  Dr. Donnetta Hutching to do an upper arm AV graft as of 10/2017..  . History of adenomatous polyp of colon   . History of kidney stones    passed  . History of subarachnoid hemorrhage 10/2014   after syncope and while on xarelto  . HOH (hard of hearing)   . Hyperlipidemia   . Hypertension    Difficult to control, in the setting of one functioning kidney: pt was referred to nephrology by Dr. Gwenlyn Found 06/2015.  . Lumbar radiculopathy 2012  . Lumbar spinal stenosis 2019   facet injections only very short term relief.  L1 and L2 selective nerve root blocks helpful 04/2018.  . Lumbar spondylosis    MR 07/2016---no sign of spinal nerve compression or cord compression.  Pt set up with outpt ortho while admitted to hosp 07/2016.  . Malnourished ([place])   . Metatarsal fracture 06/10/2016   Nondisplaced, left 5th metatarsal--pt was referred to ortho  . Nonischemic cardiomyopathy ([place])    EF improved to 55-60% on echo 12/2017  . Osteopenia 2014   T-score -2.1  . PAF (paroxysmal atrial fibrillation) (H[facility])    Eliquis started after BRAO and CVA.  She was changed to warfarin 2018--INR/coumadin mgmt per HD/nephrol.  . Presence of permanent cardiac pacemaker   . Right rib fracture 12/2016   s/p fall  . Sick sinus  syndrome ([place]) 11/2016   Dual chamber pacer insertion 2018 (Dr. Lovena Le)  . Stroke ([place]) 81/4481   cardioembolic (had CVA while on no anticoag)--"scattered subacute punctate infarcts: 1 in R parietal lobe and 2 in occipital cortex" on MRI br.  CT angio head/neck: aortic arch athero.  R ICA 20% stenosis, L ICA w/out any stenosis.  . Thoracic back pain 01/2017; 01/2018   2018: Facet?  Dr. Ramos->steroid injection.  01/2018-->CT T spine to check for a new comp fx-->none found.  Selective nerve root inj in T spine helpful on R side.   . Thoracic compression fracture ([place]) 07/2016   T3.  T7 and T8-- T8 kyphoplasty during hosp admission 07/2016.  Neuro referred pt to pain mgmt for consideration  of injection 09/2016.  I referred her to endo 08/2016 for consideration of calcitonin treatment.    Past Surgical History:  Procedure Laterality Date  . APPENDECTOMY  child  . AV FISTULA PLACEMENT Right 03/31/2017   Procedure: Right ARM Basilic vein transposition;  Surgeon: Conrad [place]ville, MD;  Location: [facility] OR;  Service: Vascular;  Laterality: Right;  . AV FISTULA PLACEMENT Right 12/06/2017   Procedure: INSERTION OF ARTERIOVENOUS (AV) GORE-TEX GRAFT ARM RIGHT UPPER EXTREMITY;  Surgeon: [name], MD;  Location: [facility];  Service: Vascular;  Laterality: Right;  . CARDIOVASCULAR STRESS TEST  01/31/2016   Stress myoview: NORMAL/Low risk.  EF 56%.  . [place]  . COLONOSCOPY  2015   + hx of adenomatous polyps.  Need digest health spec in [place] records to see when pt due for next colonoscopy  . EYE SURGERY Bilateral    Catarct  . INSERTION OF DIALYSIS CATHETER Right 01/29/2017   Procedure: INSERTION OF DIALYSIS CATHETER- RIGHT INTERNAL JUGULAR;  Surgeon: [name], MD;  Location: [facility] OR;  Service: Vascular;  Laterality: Right;  . IR GENERIC HISTORICAL  07/24/2016   IR KYPHO THORACIC WITH BONE BIOPSY 07/24/2016 Luanne Bras, MD [facility]-INTERV RAD  . KYPHOPLASTY  07/27/2016   T8  . PACEMAKER IMPLANT N/A 12/03/2016   Procedure: Pacemaker Implant;  Surgeon: Evans Lance, MD;  Location: [facility] CV LAB;  Service: Cardiovascular;  Laterality: N/A;  . Renal artery dopplers  02/26/2016   Her right renal dimension was 11 cm pole to pole with mild to moderate right renal artery stenosis. A right renal aortic ratio was 3.22 suggesting less than a 50% stenosis.  . Right upper arm AV Gore-Tex graft  12/06/2017   Dr. Donnetta Hutching  . TONSILLECTOMY    . TRANSTHORACIC ECHOCARDIOGRAM  01/29/2016; 11/2016; 12/2017   2017: EF 55-60%, normal LV wall motion, grade I DD.  No cardiac source of emboli was seen.  2018: EF 35-40%, could not assess DD due to a-fib, pulm HTN noted. 12/2017 EF 55-60%, nl wall  motion, grd II DD, mod MR and pulm regurg, increased pulm pressure (peak 52).     reports that she quit smoking about 46 years ago. She quit after 22.00 years of use. She has never used smokeless tobacco. She reports current alcohol use of about 1.0 standard drinks of alcohol per week. She reports that she does not use drugs.  Allergies  Allergen Reactions  . Clonidine Derivatives Palpitations and Other (See Comments)    VERY SEDATED  . Adhesive [Tape] Other (See Comments)    Tears skin up  . Codeine Nausea Only  . Nickel Rash  . Sulfa Antibiotics Other (See Comments)    Reaction unknown  Family History  Problem Relation Age of Onset  . Cancer Mother   . Heart disease Father   . Early death Father   . Sudden Cardiac Death Neg Hx     Prior to Admission medications   Medication Sig Start Date End Date Taking? Authorizing Provider  acetaminophen (TYLENOL) 500 MG tablet Take 500-1,000 mg 3 (three) times daily as needed by mouth for moderate pain.    Yes [provider]  amiodarone (PACERONE) 200 MG tablet TAKE 1 TABLET BY MOUTH  DAILY Patient taking differently: Take 200 mg by mouth daily.  04/11/18  Yes Lorretta Harp, MD  carvedilol (COREG) 3.125 MG tablet Take 1 tablet (3.125 mg total) by mouth 2 (two) times daily. 07/21/17  Yes Lorretta Harp, MD  HYDROcodone-acetaminophen (NORCO) 5-325 MG tablet Take 1 tablet by mouth every 6 (six) hours as needed for moderate pain or severe pain. 12/20/17  Yes McGowen, Adrian Blackwater, MD  isosorbide mononitrate (IMDUR) 60 MG 24 hr tablet TAKE 1 TABLET BY MOUTH  DAILY Patient taking differently: Take 60 mg by mouth daily.  03/09/18  Yes McGowen, Adrian Blackwater, MD  ondansetron (ZOFRAN) 4 MG tablet Take 0.5-1 tablets (2-4 mg total) by mouth every 8 (eight) hours as needed for nausea or vomiting. 12/10/16  Yes McGowen, Adrian Blackwater, MD  oxyCODONE (OXY IR/ROXICODONE) 5 MG immediate release tablet 1-2 tabs po q6h prn severe pain 05/19/18  Yes McGowen,  Adrian Blackwater, MD  polyethylene glycol (MIRALAX / GLYCOLAX) packet Take 17 g by mouth daily as needed for mild constipation.   Yes [provider]  pravastatin (PRAVACHOL) 40 MG tablet TAKE 1 TABLET BY MOUTH  EVERY EVENING Patient taking differently: Take 40 mg by mouth daily.  03/09/18  Yes McGowen, Adrian Blackwater, MD  sevelamer carbonate (RENVELA) 800 MG tablet Take 800 mg by mouth See admin instructions. Two to three times daily with meals   Yes [provider]  vitamin C (ASCORBIC ACID) 500 MG tablet Take 500 mg by mouth daily.   Yes [provider]  Vitamin D, Ergocalciferol, (DRISDOL) 1.25 MG (50000 UT) CAPS capsule Take 1 capsule (50,000 Units total) by mouth every Thursday. 04/28/18  Yes McGowen, Adrian Blackwater, MD  warfarin (COUMADIN) 3 MG tablet TAKE 1/2 TO 1 TABLET BY  MOUTH DAILY AS DIRECTED BY  COUMADIN CLINIC Patient taking differently: Take 1.5-3 mg by mouth See admin instructions. Take 0.5 tablet (1.5 mg totally) by mouth on Sun, Mon, Wed, and Friday; Take 1 tablet (3 mg totally) by mouth on Tues, Thurs, Sat. 12/24/17  Yes Lorretta Harp, MD    Physical Exam: Vitals:   05/20/18 1030 05/20/18 1045 05/20/18 1100 05/20/18 1130  BP: (!) 134/50  (!) 144/56 (!) 122/52  Pulse: 67 65 69 66  Resp: 10 16 (!) 21 17  Temp:      TempSrc:      SpO2: 97% 97% 98% 98%  Weight:      Height:          Constitutional: NAD, calm, comfortable Vitals:   05/20/18 1030 05/20/18 1045 05/20/18 1100 05/20/18 1130  BP: (!) 134/50  (!) 144/56 (!) 122/52  Pulse: 67 65 69 66  Resp: 10 16 (!) 21 17  Temp:      TempSrc:      SpO2: 97% 97% 98% 98%  Weight:      Height:       Eyes: PERRL, lids and conjunctivae normal ENMT: Mucous membranes are moist.  Posterior pharynx clear of any exudate or lesions.Normal dentition.  Neck: normal, supple, no masses, no thyromegaly Respiratory: clear to auscultation bilaterally, no wheezing, no crackles. Normal respiratory effort. No accessory muscle  use.  Cardiovascular: Regular rate and rhythm, no murmurs / rubs / gallops. No extremity edema. 2+ pedal pulses. No carotid bruits.  Abdomen: no tenderness, no masses palpated. No hepatosplenomegaly. Bowel sounds positive.  Musculoskeletal: no clubbing / cyanosis. No joint deformity upper and lower extremities. Good ROM, no contractures. Normal muscle tone.  Skin: no rashes, lesions, ulcers. No induration Neurologic: CN 2-12 grossly intact. Sensation intact, DTR normal. Strength 5/5 in all 4.  Psychiatric: Normal judgment and insight. Alert and oriented x 3. Normal mood.    Labs on Admission: I have personally reviewed following labs and imaging studies  CBC: Recent Labs  Lab 05/20/18 0925  WBC 6.6  NEUTROABS 4.3  HGB 10.8*  HCT 33.8*  MCV 104.0*  PLT 211   Basic Metabolic Panel: Recent Labs  Lab 05/20/18 0925  NA 137  K 3.6  CL 92*  CO2 30  GLUCOSE 88  BUN 14  CREATININE 4.00*  CALCIUM 8.2*   GFR: Estimated Creatinine Clearance: 8.2 mL/min (A) (by C-G formula based on SCr of 4 mg/dL (H)). Liver Function Tests: No results for input(s): AST, ALT, ALKPHOS, BILITOT, PROT, ALBUMIN in the last 168 hours. No results for input(s): LIPASE, AMYLASE in the last 168 hours. No results for input(s): AMMONIA in the last 168 hours. Coagulation Profile: Recent Labs  Lab 05/17/18 0830  INR 2.4   Cardiac Enzymes: No results for input(s): CKTOTAL, CKMB, CKMBINDEX, TROPONINI in the last 168 hours. BNP (last 3 results) No results for input(s): PROBNP in the last 8760 hours. HbA1C: No results for input(s): HGBA1C in the last 72 hours. CBG: No results for input(s): GLUCAP in the last 168 hours. Lipid Profile: No results for input(s): CHOL, HDL, LDLCALC, TRIG, CHOLHDL, LDLDIRECT in the last 72 hours. Thyroid Function Tests: No results for input(s): TSH, T4TOTAL, FREET4, T3FREE, THYROIDAB in the last 72 hours. Anemia Panel: No results for input(s): VITAMINB12, FOLATE, FERRITIN,  TIBC, IRON, RETICCTPCT in the last 72 hours. Urine analysis:    Component Value Date/Time   COLORURINE YELLOW 01/20/2017 [place] 01/20/2017 1737   LABSPEC 1.012 01/20/2017 1737   PHURINE 5.0 01/20/2017 1737   GLUCOSEU NEGATIVE 01/20/2017 1737   HGBUR NEGATIVE 01/20/2017 1737   BILIRUBINUR NEGATIVE 01/20/2017 1737   BILIRUBINUR negative 09/24/2016 1453   KETONESUR NEGATIVE 01/20/2017 1737   PROTEINUR 30 (A) 01/20/2017 1737   UROBILINOGEN 0.2 09/24/2016 1453   NITRITE NEGATIVE 01/20/2017 1737   LEUKOCYTESUR NEGATIVE 01/20/2017 1737   Sepsis Labs: !!!!!!!!!!!!!!!!!!!!!!!!!!!!!!!!!!!!!!!!!!!! @LABRCNTIP (procalcitonin:4,lacticidven:4) )No results found for this or any previous visit (from the past 240 hour(s)).   Radiological Exams on Admission: Dg Chest 1 View  Result Date: 05/20/2018 CLINICAL DATA:  Fall EXAM: CHEST  1 VIEW COMPARISON:  12/14/2017 FINDINGS: Left subclavian pacemaker device and leads are stable. Right jugular dialysis catheter has been removed. The heart remains moderately enlarged. Mild vascular congestion is stable. Chronic blunting of the left costophrenic angle is unchanged. No new consolidation or lung mass. Status post midthoracic vertebral augmentation. Chronic appearing right-sided rib deformities are stable. IMPRESSION: Stable cardiomegaly and mild vascular congestion. Electronically Signed   By: Marybelle Killings M.D.   On: 05/20/2018 08:49   Dg Hip Unilat W Or Wo Pelvis 2-3 Views Left  Result Date: 05/20/2018 CLINICAL DATA:  Fall.  Left hip pain. EXAM: DG HIP (WITH OR WITHOUT PELVIS) 2-3V LEFT COMPARISON:  05/15/2018. FINDINGS: Diffuse osteopenia and degenerative change. No acute abnormality identified. No evidence of fracture dislocation. Peripheral vascular calcification. IMPRESSION: Diffuse osteopenia degenerative change. No acute abnormality identified. Electronically Signed   By: Marcello Moores  Register   On: 05/20/2018 08:48    EKG: Independently  reviewed.  Normal sinus rhythm no acute changes Chest x-ray reviewed mild edema no focal infiltrate Old chart reviewed Case discussed with EDP  Assessment/Plan 85 year old female with left hip pain and inability to walk needs dialysis Principal Problem:   Acute on chronic respiratory failure with hypoxia (HCC)-seems to actually be at her baseline.  Active Problems:   ESRD on dialysis (HCC)-call nephrology patient will get dialysis today    PAF (paroxysmal atrial fibrillation) (HCC) currently rate controlled    Essential hypertension-stable    Personal history of subarachnoid hemorrhage-noted    History of CVA (cerebrovascular accident)-stable    Cardiac pacemaker-noted    Chronic combined systolic and diastolic heart failure (HCC)-compensated at this time    Debilitated patient-physical therapy evaluation  Hip pain-no acute fracture.  Will place on oxycodone 10 mg every 4 hours as needed for pain increased from her normal Vicodin.  She is on MiraLAX as needed    DVT prophylaxis: SCDs Code Status: Full Family Communication: Daughter Disposition Plan: 1 to 2 days Consults called: Nephrology and orthopedic surgery Admission status: Observation   DAVID,RACHAL A MD [facility]  If 7PM-7AM, please contact night-coverage [email or web address] Password [email or web address]  05/20/2018, 11:52 AM

## 2018-05-20 NOTE — ED Provider Notes (Signed)
[facility] EMERGENCY DEPARTMENT Provider Note   CSN: [id number] Arrival date & time: 05/20/18  2831     History   Chief Complaint Chief Complaint  Patient presents with  . Hip Pain    HPI Valerie Terrell is a 85 y.o. female.  The history is provided by the patient, a relative and medical records. No language interpreter was used.  Hip Pain    Valerie Terrell is a 85 y.o. female who presents to the Emergency Department complaining of hip pain. Presents to the emergency department for evaluation of left hip pain that began one week ago following a fall. She was putting soap in the dishwasher when she lost her balance and fell, hitting her left hip. She was evaluated the next day in the emergency department and had negative imaging and was discharged home. Since that time she reports persistent and worsening hip pain. Pain is located in the left posterior hip and is 1/10 with rest and 10 out of 10 with movement. She is no longer able to bear weight on the left leg. Pain has progressively worsened over the last week. This morning she noted that she had some pain in her right groin as well. She has a history of ESR D on hemodialysis, pacemaker, atrial fibrillation on Coumadin. She had her dialysis session yesterday and was told to return for dialysis tomorrow and the next day due to having extra fluid. She was unable to make dialysis today due to pain. She has been taking Vicodin at home with no improvement in her pain. Her orthopedic doctor is Dr. Nelva Bush, with [facility]. Past Medical History:  Diagnosis Date  . Anemia of chronic disease 2018   + anemia of CRI?  [name] Atrial fibrillation ([place]) [I48.91] 12/11/2016  . Atrophy of left kidney    with absent blood flow by renal artery dopplers (Dr. Gwenlyn Found)  . 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 Chronic combined systolic and diastolic CHF (congestive heart failure) ([place])    a. 11/2016: echo showing EF of 35-40%, RV  strain noted, mild MR and mild TR.   Echo showed much improved EF 12/2017 (55-60%)  . Chronic respiratory failure with hypoxia ([place]) 02/11/2017   Now on chronic O2 (intermittent as of summer 2019).    . Debilitated patient    WC dependent as of 2018  . ESRD on hemodialysis ([place]) 01/2017   T/Th/Sat schedule- [place].  Right basilic AV fistula 51/7616--[facility] unable to successfully access this.  Dr. Donnetta Hutching to do an upper arm AV graft as of 10/2017..  . History of adenomatous polyp of colon   . History of kidney stones    passed  . History of subarachnoid hemorrhage 10/2014   after syncope and while on xarelto  . HOH (hard of hearing)   . Hyperlipidemia   . Hypertension    Difficult to control, in the setting of one functioning kidney: pt was referred to nephrology by Dr. Gwenlyn Found 06/2015.  . Lumbar radiculopathy 2012  . Lumbar spinal stenosis 2019   facet injections only very short term relief.  L1 and L2 selective nerve root blocks helpful 04/2018.  . Lumbar spondylosis    MR 07/2016---no sign of spinal nerve compression or cord compression.  Pt set up with outpt ortho while admitted to hosp 07/2016.  . Malnourished ([place])   . Metatarsal fracture 06/10/2016   Nondisplaced, left 5th metatarsal--pt was referred to ortho  . Nonischemic cardiomyopathy ([place])  EF improved to 55-60% on echo 12/2017  . Osteopenia 2014   T-score -2.1  . PAF (paroxysmal atrial fibrillation) (H[facility])    Eliquis started after BRAO and CVA.  She was changed to warfarin 2018--INR/coumadin mgmt per HD/nephrol.  . Presence of permanent cardiac pacemaker   . Right rib fracture 12/2016   s/p fall  . Sick sinus syndrome ([place]) 11/2016   Dual chamber pacer insertion 2018 (Dr. Lovena Le)  . Stroke ([place]) 79/4801   cardioembolic (had CVA while on no anticoag)--"scattered subacute punctate infarcts: 1 in R parietal lobe and 2 in occipital cortex" on MRI br.  CT angio head/neck: aortic arch athero.  R ICA 20% stenosis, L ICA w/out  any stenosis.  . Thoracic back pain 01/2017; 01/2018   2018: Facet?  Dr. Ramos->steroid injection.  01/2018-->CT T spine to check for a new comp fx-->none found.  Selective nerve root inj in T spine helpful on R side.   . Thoracic compression fracture ([place]) 07/2016   T3.  T7 and T8-- T8 kyphoplasty during hosp admission 07/2016.  Neuro referred pt to pain mgmt for consideration of injection 09/2016.  I referred her to endo 08/2016 for consideration of calcitonin treatment.    Patient Active Problem List   Diagnosis Date Noted  . Hypoxia 12/14/2017  . Dyspnea 12/14/2017  . Acute respiratory failure ([place]) 02/13/2017  . Hyperkalemia 02/13/2017  . ESRD on dialysis ([place]) 02/11/2017  . Chronic respiratory failure with hypoxia ([place]) 02/11/2017  . Debilitated patient 02/07/2017  . Poor tolerance for ambulation 02/07/2017  . Fracture of one rib, right side, initial encounter for closed fracture 01/11/2017  . Closed fracture of one rib of right side   . Chronic combined systolic and diastolic heart failure ([place]) 01/05/2017  . Palpitations 12/15/2016  . Hypokalemia 12/14/2016  . Leukocytosis 12/14/2016  . Long term (current) use of anticoagulants [Z79.01] 12/11/2016  . Non-ischemic cardiomyopathy ([place]) 12/07/2016  . Acute combined systolic and diastolic heart failure ([place])  12/07/2016  . Cardiac pacemaker   . Acute on chronic respiratory failure with hypoxia ([place])   . Acute pulmonary edema with congestive heart failure ([place]) 11/30/2016  . Elevated troponin level 11/30/2016  . Anemia 11/30/2016  . Chronic midline thoracic back pain 09/15/2016  . History of CVA (cerebrovascular accident) 09/15/2016  . Hyperlipidemia   . Nausea & vomiting 08/05/2016  . Dehydration 08/05/2016  . Osteoporosis 07/28/2016  . Closed compression fracture of thoracic vertebra ([place]) 07/23/2016  . Thoracic compression fracture ([place]) 07/22/2016  . Flank pain 07/11/2016  . Hypoalbuminemia 07/11/2016  . Renal artery  stenosis ([place]) 06/24/2016  . Nondisplaced fracture of fifth metatarsal bone, left foot, initial encounter for closed fracture 06/10/2016  . Rib contusion, left, initial encounter 06/10/2016  . Single kidney 03/13/2016  . Chest pain   . Dyslipidemia   . PAF (paroxysmal atrial fibrillation) ([place]) 01/28/2016  . Essential hypertension 01/28/2016  . History of cardioembolic stroke 65/53/7482  . Personal history of subarachnoid hemorrhage 01/28/2016  . CKD (chronic kidney disease), stage IV ([place]) 01/28/2016    Past Surgical History:  Procedure Laterality Date  . APPENDECTOMY  child  . AV FISTULA PLACEMENT Right 03/31/2017   Procedure: Right ARM Basilic vein transposition;  Surgeon: Conrad [place], MD;  [facility];  Service: Vascular;  Laterality: Right;  . AV FISTULA PLACEMENT Right 12/06/2017   Procedure: INSERTION OF ARTERIOVENOUS (AV) GORE-TEX GRAFT ARM RIGHT UPPER EXTREMITY;  Surgeon: [name], MD;  Location: [facility];  Service: Vascular;  Laterality: Right;  . CARDIOVASCULAR STRESS TEST  01/31/2016   Stress myoview: NORMAL/Low risk.  EF 56%.  . [place]  . COLONOSCOPY  2015   + hx of adenomatous polyps.  Need digest health spec in [facility] records to see when pt due for next colonoscopy  . EYE SURGERY Bilateral    Catarct  . INSERTION OF DIALYSIS CATHETER Right 01/29/2017   Procedure: INSERTION OF DIALYSIS CATHETER- RIGHT INTERNAL JUGULAR;  Surgeon: [name], MD;  Location: [facility] OR;  Service: Vascular;  Laterality: Right;  . IR GENERIC HISTORICAL  07/24/2016   IR KYPHO THORACIC WITH BONE BIOPSY 07/24/2016 Luanne Bras, MD [facility]-INTERV RAD  . KYPHOPLASTY  07/27/2016   T8  . PACEMAKER IMPLANT N/A 12/03/2016   Procedure: Pacemaker Implant;  Surgeon: Evans Lance, MD;  Location: [facility] CV LAB;  Service: Cardiovascular;  Laterality: N/A;  . Renal artery dopplers  02/26/2016   Her right renal dimension was 11 cm pole to pole with mild to moderate right renal  artery stenosis. A right renal aortic ratio was 3.22 suggesting less than a 50% stenosis.  . Right upper arm AV Gore-Tex graft  12/06/2017   Dr. Donnetta Hutching  . TONSILLECTOMY    . TRANSTHORACIC ECHOCARDIOGRAM  01/29/2016; 11/2016; 12/2017   2017: EF 55-60%, normal LV wall motion, grade I DD.  No cardiac source of emboli was seen.  2018: EF 35-40%, could not assess DD due to a-fib, pulm HTN noted. 12/2017 EF 55-60%, nl wall motion, grd II DD, mod MR and pulm regurg, increased pulm pressure (peak 52).     OB History   No obstetric history on file.      Home Medications    Prior to Admission medications   Medication Sig Start Date End Date Taking? Authorizing Provider  acetaminophen (TYLENOL) 500 MG tablet Take 500-1,000 mg 3 (three) times daily as needed by mouth for moderate pain.     [provider]  amiodarone (PACERONE) 200 MG tablet TAKE 1 TABLET BY MOUTH  DAILY Patient taking differently: Take 200 mg by mouth daily.  04/11/18   Lorretta Harp, MD  carvedilol (COREG) 3.125 MG tablet Take 1 tablet (3.125 mg total) by mouth 2 (two) times daily. 07/21/17   Lorretta Harp, MD  HYDROcodone-acetaminophen (NORCO) 5-325 MG tablet Take 1 tablet by mouth every 6 (six) hours as needed for moderate pain or severe pain. 12/20/17   McGowen, Adrian Blackwater, MD  isosorbide mononitrate (IMDUR) 60 MG 24 hr tablet TAKE 1 TABLET BY MOUTH  DAILY Patient taking differently: Take 60 mg by mouth daily.  03/09/18   McGowen, Adrian Blackwater, MD  ondansetron (ZOFRAN) 4 MG tablet Take 0.5-1 tablets (2-4 mg total) by mouth every 8 (eight) hours as needed for nausea or vomiting. 12/10/16   McGowen, Adrian Blackwater, MD  oxyCODONE (OXY IR/ROXICODONE) 5 MG immediate release tablet 1-2 tabs po q6h prn severe pain 05/19/18   McGowen, Adrian Blackwater, MD  polyethylene glycol (MIRALAX / GLYCOLAX) packet Take 17 g by mouth daily as needed for mild constipation.    [provider]  pravastatin (PRAVACHOL) 40 MG tablet TAKE 1 TABLET BY MOUTH   EVERY EVENING Patient taking differently: Take 40 mg by mouth daily.  03/09/18   McGowen, Adrian Blackwater, MD  sevelamer carbonate (RENVELA) 800 MG tablet Take 800 mg by mouth See admin instructions. Two to three times daily with meals    [provider]  vitamin  C (ASCORBIC ACID) 500 MG tablet Take 500 mg by mouth daily.    [provider]  Vitamin D, Ergocalciferol, (DRISDOL) 1.25 MG (50000 UT) CAPS capsule Take 1 capsule (50,000 Units total) by mouth every Thursday. 04/28/18   McGowen, Adrian Blackwater, MD  warfarin (COUMADIN) 3 MG tablet TAKE 1/2 TO 1 TABLET BY  MOUTH DAILY AS DIRECTED BY  COUMADIN CLINIC Patient taking differently: Take 1.5-3 mg by mouth See admin instructions. Take 0.5 tablet (1.5 mg totally) by mouth on Mon, Wed, and Friday; Take 1 tablet (3 mg totally) by mouth on Mon, Wed, and Friday 12/24/17   Lorretta Harp, MD    Family History Family History  Problem Relation Age of Onset  . Cancer Mother   . Heart disease Father   . Early death Father   . Sudden Cardiac Death Neg Hx     Social History Social History   Tobacco Use  . Smoking status: Former Smoker    Years: 22.00    Last attempt to quit: 03/17/1972    Years since quitting: 46.2  . Smokeless tobacco: Never Used  Substance Use Topics  . Alcohol use: Yes    Alcohol/week: 1.0 standard drinks    Types: 1 Glasses of wine per week    Comment: wine  . Drug use: No     Allergies   Clonidine derivatives; Adhesive [tape]; Codeine; Nickel; and Sulfa antibiotics   Review of Systems Review of Systems  All other systems reviewed and are negative.    Physical Exam Updated Vital Signs Pulse 71   Temp 98.4 F (36.9 C) (Oral)   Ht 4' 11"  (1.499 m)   Wt 57 kg   SpO2 99%   BMI 25.38 kg/m   Physical Exam Vitals signs and nursing note reviewed.  Constitutional:      Appearance: She is well-developed.  HENT:     Head: Normocephalic and atraumatic.  Cardiovascular:     Rate and Rhythm: Normal  rate and regular rhythm.     Heart sounds: No murmur.  Pulmonary:     Effort: Pulmonary effort is normal. No respiratory distress.     Breath sounds: Normal breath sounds.  Abdominal:     Palpations: Abdomen is soft.     Tenderness: There is no abdominal tenderness. There is no guarding or rebound.  Musculoskeletal:     Comments: 2+ DP pulses bilaterally. There is mild tenderness to palpation over the left posterior hip. Patient is able to range the left hip but does have pain on flexion. Non-pitting edema to bilateral lower extremities.  Fistula in RUE.  Skin:    General: Skin is warm and dry.  Neurological:     Mental Status: She is alert and oriented to person, place, and time.  Psychiatric:        Mood and Affect: Mood normal.        Behavior: Behavior normal.      ED Treatments / Results  Labs (all labs ordered are listed, but only abnormal results are displayed) Labs Reviewed - No data to display  EKG None  Radiology No results found.  Procedures Procedures (including critical care time)  Medications Ordered in ED Medications  oxyCODONE (Oxy IR/ROXICODONE) immediate release tablet 5 mg (has no administration in time range)     Initial Impression / Assessment and Plan / ED Course  I have reviewed the triage vital signs and the nursing notes.  Pertinent labs & imaging results that were available  during my care of the patient were reviewed by me and considered in my medical decision making (see chart for details).     Patient with ESR D on hemodialysis here for evaluation of left hip pain following a fall one week ago. Imaging and prior ED visit reviewed. Plain films negative for fracture today. Presentation is not consistent with septic joint. Her pain is partially improved after oxycodone administration in the emergency department but she is still unable to ambulate or weight bear. Orthopedics consulted regarding her hip pain, and ability to ambulate. She does  have evidence of volume overload on examination, new oxygen requirement. Discussed with Dr. Joelyn Oms with nephrology. Hospitalist consulted for admission due to ongoing hip pain and inability to ambulate.  Final Clinical Impressions(s) / ED Diagnoses   Final diagnoses:  None    ED Discharge Orders    None       Quintella Reichert, MD 05/20/18 1023

## 2018-05-20 NOTE — Procedures (Signed)
I was present at this dialysis session. I have reviewed the session itself and made appropriate changes.   AVF. 2L Goal.  BP stable.    Filed Weights   05/20/18 0744  Weight: 57 kg    Recent Labs  Lab 05/20/18 0925  NA 137  K 3.6  CL 92*  CO2 30  GLUCOSE 88  BUN 14  CREATININE 4.00*  CALCIUM 8.2*    Recent Labs  Lab 05/20/18 0925  WBC 6.6  NEUTROABS 4.3  HGB 10.8*  HCT 33.8*  MCV 104.0*  PLT 172    Scheduled Meds: . [START ON 05/21/2018] amiodarone  200 mg Oral Daily  . carvedilol  3.125 mg Oral BID  . [START ON 05/21/2018] Chlorhexidine Gluconate Cloth  6 each Topical Q0600  . [START ON 05/21/2018] isosorbide mononitrate  60 mg Oral Daily  . oxyCODONE      . pravastatin  40 mg Oral Daily  . sevelamer carbonate  800 mg Oral See admin instructions  . sodium chloride flush  3 mL Intravenous Q12H  . [START ON 05/21/2018] vitamin C  500 mg Oral Daily  . [START ON 05/26/2018] Vitamin D (Ergocalciferol)  50,000 Units Oral Q Thu  . warfarin  1.5-3 mg Oral See admin instructions   Continuous Infusions: . sodium chloride    . sodium chloride    . sodium chloride     PRN Meds:.sodium chloride, sodium chloride, sodium chloride, acetaminophen, alteplase, heparin, heparin, lidocaine (PF), lidocaine-prilocaine, ondansetron **OR** ondansetron (ZOFRAN) IV, oxyCODONE, pentafluoroprop-tetrafluoroeth, polyethylene glycol, sodium chloride flush   Pearson Grippe  MD 05/20/2018, 2:58 PM

## 2018-05-20 NOTE — ED Notes (Signed)
Pt given food and beverage per Dr. Ralene Bathe

## 2018-05-20 NOTE — ED Notes (Signed)
Doc at bedside will transport to dialysis when she is done talking to the pt

## 2018-05-20 NOTE — ED Notes (Signed)
Patient transported to X-ray 

## 2018-05-20 NOTE — ED Triage Notes (Signed)
Pt from home via ems; c/o L hip pain; mechanical fall Sunday, seen in mced; no fracture seen on x ray, sent home; non-weight baring since Sunday, pain progressively worsening; no shortening or rotation noted; pt has pacemaker; dialysis pt, restricted R side; due for dialysis today; took all morning meds  126/58 RR 14 P 72 99% RA

## 2018-05-20 NOTE — Progress Notes (Signed)
PT Cancellation Note  Patient Details Name: Valerie Terrell MRN: [id number] DOB: 1932/07/24   Cancelled Treatment:    Reason Eval/Treat Not Completed: Patient at procedure or test/unavailable (HD).  Ellamae Sia, PT, DPT Acute Rehabilitation Services Pager [phone] Office [phone]    Willy Eddy 05/20/2018, 2:26 PM

## 2018-05-20 NOTE — Consult Note (Signed)
Reason for Consult:Left hip pain Referring Physician: E Valerie Terrell is an 85 y.o. female.  HPI: Valerie Terrell fell on 12/28 and injured her left hip. She came to the ED the following day and x-rays and CT were negative for fx. She returned home having to use a walker (she was independent prior to fall). She was told to return if no better and, if anything, she's gotten a little worse. She describes left hip pain with leg movement and weight bearing. She denies radiation or N/T. When asked to identify location she points to posterior hip. She sees Dr. Nelva Bush at [facility] for her back pain.  Past Medical History:  Diagnosis Date  . Anemia of chronic disease 2018   + anemia of CRI?  Valerie Terrell Atrial fibrillation ([place] ) [I48.91] 12/11/2016  . Atrophy of left kidney    with absent blood flow by renal artery dopplers (Dr. Gwenlyn Found)  . 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 Chronic combined systolic and diastolic CHF (congestive heart failure) ([place])    a. 11/2016: echo showing EF of 35-40%, RV strain noted, mild MR and mild TR.   Echo showed much improved EF 12/2017 (55-60%)  . Chronic respiratory failure with hypoxia ([place]) 02/11/2017   Now on chronic O2 (intermittent as of summer 2019).    . Debilitated patient    WC dependent as of 2018  . ESRD on hemodialysis ([place]) 01/2017   T/Th/Sat schedule- [place].  Right basilic AV fistula [id number]--NFA unable to successfully access this.  Dr. Donnetta Hutching to do an upper arm AV graft as of 10/2017..  . History of adenomatous polyp of colon   . History of kidney stones    passed  . History of subarachnoid hemorrhage 10/2014   after syncope and while on xarelto  . HOH (hard of hearing)   . Hyperlipidemia   . Hypertension    Difficult to control, in the setting of one functioning kidney: pt was referred to nephrology by Dr. Gwenlyn Found 06/2015.  . Lumbar radiculopathy 2012  . Lumbar spinal stenosis 2019   facet injections only very short term relief.  L1 and L2  selective nerve root blocks helpful 04/2018.  . Lumbar spondylosis    MR 07/2016---no sign of spinal nerve compression or cord compression.  Pt set up with outpt ortho while admitted to hosp 07/2016.  . Malnourished ([place])   . Metatarsal fracture 06/10/2016   Nondisplaced, left 5th metatarsal--pt was referred to ortho  . Nonischemic cardiomyopathy ([place])    EF improved to 55-60% on echo 12/2017  . Osteopenia 2014   T-score -2.1  . PAF (paroxysmal atrial fibrillation) ([facility])    Eliquis started after BRAO and CVA.  She was changed to warfarin 2018--INR/coumadin mgmt per HD/nephrol.  . Presence of permanent cardiac pacemaker   . Right rib fracture 12/2016   s/p fall  . Sick sinus syndrome ([place]) 11/2016   Dual chamber pacer insertion 2018 (Dr. Lovena Le)  . Stroke ([place]) 21/3086   cardioembolic (had CVA while on no anticoag)--"scattered subacute punctate infarcts: 1 in R parietal lobe and 2 in occipital cortex" on MRI br.  CT angio head/neck: aortic arch athero.  R ICA 20% stenosis, L ICA w/out any stenosis.  . Thoracic back pain 01/2017; 01/2018   2018: Facet?  Dr. Ramos->steroid injection.  01/2018-->CT T spine to check for a new comp fx-->none found.  Selective nerve root inj in T spine helpful on R side.   . Thoracic compression  fracture ([place]) 07/2016   T3.  T7 and T8-- T8 kyphoplasty during hosp admission 07/2016.  Neuro referred pt to pain mgmt for consideration of injection 09/2016.  I referred her to endo 08/2016 for consideration of calcitonin treatment.    Past Surgical History:  Procedure Laterality Date  . APPENDECTOMY  child  . AV FISTULA PLACEMENT Right 03/31/2017   Procedure: Right ARM Basilic vein transposition;  Surgeon: Conrad Ellenville, MD;  Location: [facility];  Service: Vascular;  Laterality: Right;  . AV FISTULA PLACEMENT Right 12/06/2017   Procedure: INSERTION OF ARTERIOVENOUS (AV) GORE-TEX GRAFT ARM RIGHT UPPER EXTREMITY;  Surgeon: [name], MD;  Location: [facility];  Service: Vascular;   Laterality: Right;  . CARDIOVASCULAR STRESS TEST  01/31/2016   Stress myoview: NORMAL/Low risk.  EF 56%.  . [place]  . COLONOSCOPY  2015   + hx of adenomatous polyps.  Need digest health spec in [place] records to see when pt due for next colonoscopy  . EYE SURGERY Bilateral    Catarct  . INSERTION OF DIALYSIS CATHETER Right 01/29/2017   Procedure: INSERTION OF DIALYSIS CATHETER- RIGHT INTERNAL JUGULAR;  Surgeon: [name], MD;  Location: [facility] OR;  Service: Vascular;  Laterality: Right;  . IR GENERIC HISTORICAL  07/24/2016   IR KYPHO THORACIC WITH BONE BIOPSY 07/24/2016 Luanne Bras, MD [facility]-INTERV RAD  . KYPHOPLASTY  07/27/2016   T8  . PACEMAKER IMPLANT N/A 12/03/2016   Procedure: Pacemaker Implant;  Surgeon: Evans Lance, MD;  Location: [facility] CV LAB;  Service: Cardiovascular;  Laterality: N/A;  . Renal artery dopplers  02/26/2016   Her right renal dimension was 11 cm pole to pole with mild to moderate right renal artery stenosis. A right renal aortic ratio was 3.22 suggesting less than a 50% stenosis.  . Right upper arm AV Gore-Tex graft  12/06/2017   Dr. Donnetta Hutching  . TONSILLECTOMY    . TRANSTHORACIC ECHOCARDIOGRAM  01/29/2016; 11/2016; 12/2017   2017: EF 55-60%, normal LV wall motion, grade I DD.  No cardiac source of emboli was seen.  2018: EF 35-40%, could not assess DD due to a-fib, pulm HTN noted. 12/2017 EF 55-60%, nl wall motion, grd II DD, mod MR and pulm regurg, increased pulm pressure (peak 52).    Family History  Problem Relation Age of Onset  . Cancer Mother   . Heart disease Father   . Early death Father   . Sudden Cardiac Death Neg Hx     Social History:  reports that she quit smoking about 46 years ago. She quit after 22.00 years of use. She has never used smokeless tobacco. She reports current alcohol use of about 1.0 standard drinks of alcohol per week. She reports that she does not use drugs.  Allergies:  Allergies  Allergen Reactions  .  Clonidine Derivatives Palpitations and Other (See Comments)    VERY SEDATED  . Adhesive [Tape] Other (See Comments)    Tears skin up  . Codeine Nausea Only  . Nickel Rash  . Sulfa Antibiotics Other (See Comments)    Reaction unknown    Medications: I have reviewed the patient's current medications.  No results found for this or any previous visit (from the past 48 hour(s)).  Dg Chest 1 View  Result Date: 05/20/2018 CLINICAL DATA:  Fall EXAM: CHEST  1 VIEW COMPARISON:  12/14/2017 FINDINGS: Left subclavian pacemaker device and leads are stable. Right jugular dialysis catheter has been removed. The  heart remains moderately enlarged. Mild vascular congestion is stable. Chronic blunting of the left costophrenic angle is unchanged. No new consolidation or lung mass. Status post midthoracic vertebral augmentation. Chronic appearing right-sided rib deformities are stable. IMPRESSION: Stable cardiomegaly and mild vascular congestion. Electronically Signed   By: Marybelle Killings M.D.   On: 05/20/2018 08:49   Dg Hip Unilat W Or Wo Pelvis 2-3 Views Left  Result Date: 05/20/2018 CLINICAL DATA:  Fall.  Left hip pain. EXAM: DG HIP (WITH OR WITHOUT PELVIS) 2-3V LEFT COMPARISON:  05/15/2018. FINDINGS: Diffuse osteopenia and degenerative change. No acute abnormality identified. No evidence of fracture dislocation. Peripheral vascular calcification. IMPRESSION: Diffuse osteopenia degenerative change. No acute abnormality identified. Electronically Signed   By: Marcello Moores  Register   On: 05/20/2018 08:48    Review of Systems  Constitutional: Negative for weight loss.  HENT: Negative for ear discharge, ear pain, hearing loss and tinnitus.   Eyes: Negative for blurred vision, double vision, photophobia and pain.  Respiratory: Negative for cough, sputum production and shortness of breath.   Cardiovascular: Negative for chest pain.  Gastrointestinal: Negative for abdominal pain, nausea and vomiting.  Genitourinary:  Negative for dysuria, flank pain, frequency and urgency.  Musculoskeletal: Positive for joint pain (Left hip). Negative for back pain, falls, myalgias and neck pain.  Neurological: Negative for dizziness, tingling, sensory change, focal weakness, loss of consciousness and headaches.  Endo/Heme/Allergies: Does not bruise/bleed easily.  Psychiatric/Behavioral: Negative for depression, memory loss and substance abuse. The patient is not nervous/anxious.    Blood pressure (!) 131/53, pulse 66, temperature 98.4 F (36.9 C), temperature source Oral, resp. rate 16, height 4\' 11"  (1.499 m), weight 57 kg, SpO2 99 %. Physical Exam  Constitutional: She appears well-developed and well-nourished. No distress.  HENT:  Head: Normocephalic and atraumatic.  Eyes: Conjunctivae are normal. Right eye exhibits no discharge. Left eye exhibits no discharge. No scleral icterus.  Neck: Normal range of motion.  Cardiovascular: Normal rate and regular rhythm.  Respiratory: Effort normal. No respiratory distress.  Musculoskeletal:     Comments: LLE No traumatic wounds, ecchymosis, or rash  Mild TTP groin, glute, severe pain with AROM of even small amount, severe pain with PROM but not until hip flexion of ~80 degrees  No knee or ankle effusion  Knee stable to varus/ valgus and anterior/posterior stress  Sens DPN, SPN, TN intact  Motor EHL, ext, flex, evers 5/5  DP 2+, PT 2+, No significant edema  Neurological: She is alert.  Skin: Skin is warm and dry. She is not diaphoretic.  Psychiatric: She has a normal mood and affect. Her behavior is normal.    Assessment/Plan: Left hip pain -- While not impossible she has an occult or stress fx of the pelvis/femur I find this unlikely. It wound not change management in any case so I don't think further imaging is warranted. Most likely is some sort of muscle strain to the gluteus complex. Would recommend PT/OT and would also suggest muscle relaxers, topical NSAID therapy,  and heat. She should f/u with Dr. Nelva Bush at discharge. Ortho will not plan to follow while in the hospital; please call with questions.    Lisette Abu, PA-C Orthopedic Surgery [phone] 05/20/2018, 9:48 AM

## 2018-05-20 NOTE — Consult Note (Signed)
[facility] Renal Consultation Note    Indication for Consultation:  Management of ESRD/hemodialysis; anemia, hypertension/volume and secondary hyperparathyroidism PCP:  HPI: Valerie Terrell is a 85 y.o. female with ESRD on HD TTS at [facility] ESRD 2/2 Cardiorenal Syndrome/solitary kidney. HD started 01/2017. PMH AoCHF, NICM, Last EF 55-60%,  PAF on coumadin (coumadin clinic) HTN, generalized anxiety depression, S/P PPM placement 07/18, thoracic kyphoplasty in March 2018, lumbar radiculopathy.   Admitted under observation status with L hip pain following fall at home on 12/29. Fell on left hip while loading dishwasher. Was seen in ED and had negative hip xray/CT. Returned this am with worsening pain and inability to bear weight on hip. Repeat hip xray without fracture or acute changes. Hypoxic on arrival, O2 sats 84% per notes. CXR showed mild vascular congestion.   Seen and examined in ED. Breathing unlabored, sats 100% on 2L [place]. Biggest complaint is L hip pain, didn't sleep well last night and was unable to walk this morning. Doesn't think breathing worse than usual, denies CP, N/V/D.   Last dialysis yesterday. Left 5.4kg over dry weight and had been scheduled for extra dialysis treatment at outpatient center today. Chronic volume overload d/t UF limits. Patient usually does not tolerate more than 2L per treatment.   Past Medical History:  Diagnosis Date  . Anemia of chronic disease 2018   + anemia of CRI?  [name] Atrial fibrillation ([place]) [I48.91] 12/11/2016  . Atrophy of left kidney    with absent blood flow by renal artery dopplers (Dr. Gwenlyn Found)  . 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 Chronic combined systolic and diastolic CHF (congestive heart failure) ([place])    a. 11/2016: echo showing EF of 35-40%, RV strain noted, mild MR and mild TR.   Echo showed much improved EF 12/2017 (55-60%)  . Chronic respiratory failure with hypoxia ([place]) 02/11/2017   Now on chronic O2  (intermittent as of summer 2019).    . Debilitated patient    WC dependent as of 2018  . ESRD on hemodialysis ([place]) 01/2017   T/Th/Sat schedule- [place].  Right basilic AV fistula 26/7124--PYK unable to successfully access this.  Dr. Donnetta Hutching to do an upper arm AV graft as of 10/2017..  . History of adenomatous polyp of colon   . History of kidney stones    passed  . History of subarachnoid hemorrhage 10/2014   after syncope and while on xarelto  . HOH (hard of hearing)   . Hyperlipidemia   . Hypertension    Difficult to control, in the setting of one functioning kidney: pt was referred to nephrology by Dr. Gwenlyn Found 06/2015.  . Lumbar radiculopathy 2012  . Lumbar spinal stenosis 2019   facet injections only very short term relief.  L1 and L2 selective nerve root blocks helpful 04/2018.  . Lumbar spondylosis    MR 07/2016---no sign of spinal nerve compression or cord compression.  Pt set up with outpt ortho while admitted to hosp 07/2016.  . Malnourished ([place])   . Metatarsal fracture 06/10/2016   Nondisplaced, left 5th metatarsal--pt was referred to ortho  . Nonischemic cardiomyopathy ([place])    EF improved to 55-60% on echo 12/2017  . Osteopenia 2014   T-score -2.1  . PAF (paroxysmal atrial fibrillation) (HCC)    Eliquis started after BRAO and CVA.  She was changed to warfarin 2018--INR/coumadin mgmt per HD/nephrol.  . Presence of permanent cardiac pacemaker   . Right rib fracture 12/2016  s/p fall  . Sick sinus syndrome ([place]) 11/2016   Dual chamber pacer insertion 2018 (Dr. Lovena Le)  . Stroke ([place]) 06/4095   cardioembolic (had CVA while on no anticoag)--"scattered subacute punctate infarcts: 1 in R parietal lobe and 2 in occipital cortex" on MRI br.  CT angio head/neck: aortic arch athero.  R ICA 20% stenosis, L ICA w/out any stenosis.  . Thoracic back pain 01/2017; 01/2018   2018: Facet?  Dr. Ramos->steroid injection.  01/2018-->CT T spine to check for a new comp fx-->none found.   Selective nerve root inj in T spine helpful on R side.   . Thoracic compression fracture ([place]) 07/2016   T3.  T7 and T8-- T8 kyphoplasty during hosp admission 07/2016.  Neuro referred pt to pain mgmt for consideration of injection 09/2016.  I referred her to endo 08/2016 for consideration of calcitonin treatment.   Past Surgical History:  Procedure Laterality Date  . APPENDECTOMY  child  . AV FISTULA PLACEMENT Right 03/31/2017   Procedure: Right ARM Basilic vein transposition;  Surgeon: Conrad Justin, MD;  Location: [facility] OR;  Service: Vascular;  Laterality: Right;  . AV FISTULA PLACEMENT Right 12/06/2017   Procedure: INSERTION OF ARTERIOVENOUS (AV) GORE-TEX GRAFT ARM RIGHT UPPER EXTREMITY;  Surgeon: [name], MD;  Location: [place];  Service: Vascular;  Laterality: Right;  . CARDIOVASCULAR STRESS TEST  01/31/2016   Stress myoview: NORMAL/Low risk.  EF 56%.  . [place]  . COLONOSCOPY  2015   + hx of adenomatous polyps.  Need digest health spec in [place] records to see when pt due for next colonoscopy  . EYE SURGERY Bilateral    Catarct  . INSERTION OF DIALYSIS CATHETER Right 01/29/2017   Procedure: INSERTION OF DIALYSIS CATHETER- RIGHT INTERNAL JUGULAR;  Surgeon: [name], MD;  Location: [facility] OR;  Service: Vascular;  Laterality: Right;  . IR GENERIC HISTORICAL  07/24/2016   IR KYPHO THORACIC WITH BONE BIOPSY 07/24/2016 Luanne Bras, MD [facility]-INTERV RAD  . KYPHOPLASTY  07/27/2016   T8  . PACEMAKER IMPLANT N/A 12/03/2016   Procedure: Pacemaker Implant;  Surgeon: Evans Lance, MD;  Location: [place] CV LAB;  Service: Cardiovascular;  Laterality: N/A;  . Renal artery dopplers  02/26/2016   Her right renal dimension was 11 cm pole to pole with mild to moderate right renal artery stenosis. A right renal aortic ratio was 3.22 suggesting less than a 50% stenosis.  . Right upper arm AV Gore-Tex graft  12/06/2017   Dr. Donnetta Hutching  . TONSILLECTOMY    . TRANSTHORACIC ECHOCARDIOGRAM   01/29/2016; 11/2016; 12/2017   2017: EF 55-60%, normal LV wall motion, grade I DD.  No cardiac source of emboli was seen.  2018: EF 35-40%, could not assess DD due to a-fib, pulm HTN noted. 12/2017 EF 55-60%, nl wall motion, grd II DD, mod MR and pulm regurg, increased pulm pressure (peak 52).   Family History  Problem Relation Age of Onset  . Cancer Mother   . Heart disease Father   . Early death Father   . Sudden Cardiac Death Neg Hx    Social History:  reports that she quit smoking about 46 years ago. She quit after 22.00 years of use. She has never used smokeless tobacco. She reports current alcohol use of about 1.0 standard drinks of alcohol per week. She reports that she does not use drugs. Allergies  Allergen Reactions  . Clonidine Derivatives Palpitations and Other (See Comments)  VERY SEDATED  . Adhesive [Tape] Other (See Comments)    Tears skin up  . Codeine Nausea Only  . Nickel Rash  . Sulfa Antibiotics Other (See Comments)    Reaction unknown   Prior to Admission medications   Medication Sig Start Date End Date Taking? Authorizing Provider  acetaminophen (TYLENOL) 500 MG tablet Take 500-1,000 mg 3 (three) times daily as needed by mouth for moderate pain.    Yes [provider]  amiodarone (PACERONE) 200 MG tablet TAKE 1 TABLET BY MOUTH  DAILY Patient taking differently: Take 200 mg by mouth daily.  04/11/18  Yes Lorretta Harp, MD  carvedilol (COREG) 3.125 MG tablet Take 1 tablet (3.125 mg total) by mouth 2 (two) times daily. 07/21/17  Yes Lorretta Harp, MD  HYDROcodone-acetaminophen (NORCO) 5-325 MG tablet Take 1 tablet by mouth every 6 (six) hours as needed for moderate pain or severe pain. 12/20/17  Yes McGowen, Adrian Blackwater, MD  isosorbide mononitrate (IMDUR) 60 MG 24 hr tablet TAKE 1 TABLET BY MOUTH  DAILY Patient taking differently: Take 60 mg by mouth daily.  03/09/18  Yes McGowen, Adrian Blackwater, MD  ondansetron (ZOFRAN) 4 MG tablet Take 0.5-1 tablets (2-4 mg  total) by mouth every 8 (eight) hours as needed for nausea or vomiting. 12/10/16  Yes McGowen, Adrian Blackwater, MD  oxyCODONE (OXY IR/ROXICODONE) 5 MG immediate release tablet 1-2 tabs po q6h prn severe pain 05/19/18  Yes McGowen, Adrian Blackwater, MD  polyethylene glycol (MIRALAX / GLYCOLAX) packet Take 17 g by mouth daily as needed for mild constipation.   Yes [provider]  pravastatin (PRAVACHOL) 40 MG tablet TAKE 1 TABLET BY MOUTH  EVERY EVENING Patient taking differently: Take 40 mg by mouth daily.  03/09/18  Yes McGowen, Adrian Blackwater, MD  sevelamer carbonate (RENVELA) 800 MG tablet Take 800 mg by mouth See admin instructions. Two to three times daily with meals   Yes [provider]  vitamin C (ASCORBIC ACID) 500 MG tablet Take 500 mg by mouth daily.   Yes [provider]  Vitamin D, Ergocalciferol, (DRISDOL) 1.25 MG (50000 UT) CAPS capsule Take 1 capsule (50,000 Units total) by mouth every Thursday. 04/28/18  Yes McGowen, Adrian Blackwater, MD  warfarin (COUMADIN) 3 MG tablet TAKE 1/2 TO 1 TABLET BY  MOUTH DAILY AS DIRECTED BY  COUMADIN CLINIC Patient taking differently: Take 1.5-3 mg by mouth See admin instructions. Take 0.5 tablet (1.5 mg totally) by mouth on Sun, Mon, Wed, and Friday; Take 1 tablet (3 mg totally) by mouth on Tues, Thurs, Sat. 12/24/17  Yes Lorretta Harp, MD   Current Facility-Administered Medications  Medication Dose Route Frequency Provider Last Rate Last Dose  . 0.9 %  sodium chloride infusion  250 mL Intravenous PRN Derrill Kay A, MD      . 0.9 %  sodium chloride infusion  100 mL Intravenous PRN Tanaia Hawkey, Thomos Lemons, PA-C      . 0.9 %  sodium chloride infusion  100 mL Intravenous PRN Corderius Saraceni, Thomos Lemons, PA-C      . acetaminophen (TYLENOL) tablet 500-1,000 mg  500-1,000 mg Oral TID PRN Phillips Grout, MD      . alteplase (CATHFLO ACTIVASE) injection 2 mg  2 mg Intracatheter Once PRN Lynnda Child, PA-C      . [START ON 05/21/2018] amiodarone (PACERONE)  tablet 200 mg  200 mg Oral Daily Derrill Kay A, MD      . carvedilol (COREG) tablet 3.125  mg  3.125 mg Oral BID Phillips Grout, MD      . Derrill Memo ON 05/21/2018] Chlorhexidine Gluconate Cloth 2 % PADS 6 each  6 each Topical Q0600 Lynnda Child, PA-C      . heparin 1000 UNIT/ML injection           . heparin injection 1,000 Units  1,000 Units Dialysis PRN Lynnda Child, PA-C      . heparin injection 1,100 Units  20 Units/kg Dialysis PRN Lynnda Child, PA-C      . [START ON 05/21/2018] isosorbide mononitrate (IMDUR) 24 hr tablet 60 mg  60 mg Oral Daily David, Rachal A, MD      . lidocaine (PF) (XYLOCAINE) 1 % injection 5 mL  5 mL Intradermal PRN Lynnda Child, PA-C      . lidocaine-prilocaine (EMLA) cream 1 application  1 application Topical PRN Kwesi Sangha, Thomos Lemons, PA-C      . ondansetron (ZOFRAN) tablet 4 mg  4 mg Oral Q6H PRN Phillips Grout, MD       Or  . ondansetron (ZOFRAN) injection 4 mg  4 mg Intravenous Q6H PRN Phillips Grout, MD      . oxyCODONE (Oxy IR/ROXICODONE) immediate release tablet 10 mg  10 mg Oral Q4H PRN Phillips Grout, MD      . pentafluoroprop-tetrafluoroeth (GEBAUERS) aerosol 1 application  1 application Topical PRN Cordney Barstow, Thomos Lemons, PA-C      . polyethylene glycol (MIRALAX / GLYCOLAX) packet 17 g  17 g Oral Daily PRN Phillips Grout, MD      . pravastatin (PRAVACHOL) tablet 40 mg  40 mg Oral Daily Derrill Kay A, MD      . sevelamer carbonate (RENVELA) tablet 800 mg  800 mg Oral See admin instructions Derrill Kay A, MD      . sodium chloride flush (NS) 0.9 % injection 3 mL  3 mL Intravenous Q12H David, Rachal A, MD      . sodium chloride flush (NS) 0.9 % injection 3 mL  3 mL Intravenous PRN Phillips Grout, MD      . Derrill Memo ON 05/21/2018] vitamin C (ASCORBIC ACID) tablet 500 mg  500 mg Oral Daily Phillips Grout, MD      . Derrill Memo ON 05/26/2018] Vitamin D (Ergocalciferol) (DRISDOL) capsule 50,000 Units  50,000 Units Oral Q Ulice Dash,  Rachal A, MD      . warfarin (COUMADIN) tablet 1.5-3 mg  1.5-3 mg Oral See admin instructions Phillips Grout, MD       Current Outpatient Medications  Medication Sig Dispense Refill  . acetaminophen (TYLENOL) 500 MG tablet Take 500-1,000 mg 3 (three) times daily as needed by mouth for moderate pain.     [name] amiodarone (PACERONE) 200 MG tablet TAKE 1 TABLET BY MOUTH  DAILY (Patient taking differently: Take 200 mg by mouth daily. ) 90 tablet 2  . carvedilol (COREG) 3.125 MG tablet Take 1 tablet (3.125 mg total) by mouth 2 (two) times daily. 30 tablet 0  . HYDROcodone-acetaminophen (NORCO) 5-325 MG tablet Take 1 tablet by mouth every 6 (six) hours as needed for moderate pain or severe pain. 60 tablet 0  . isosorbide mononitrate (IMDUR) 60 MG 24 hr tablet TAKE 1 TABLET BY MOUTH  DAILY (Patient taking differently: Take 60 mg by mouth daily. ) 90 tablet 1  . ondansetron (ZOFRAN) 4 MG tablet Take 0.5-1 tablets (2-4 mg total) by mouth every 8 (eight) hours as  needed for nausea or vomiting. 90 tablet 3  . oxyCODONE (OXY IR/ROXICODONE) 5 MG immediate release tablet 1-2 tabs po q6h prn severe pain 30 tablet 0  . polyethylene glycol (MIRALAX / GLYCOLAX) packet Take 17 g by mouth daily as needed for mild constipation.    . pravastatin (PRAVACHOL) 40 MG tablet TAKE 1 TABLET BY MOUTH  EVERY EVENING (Patient taking differently: Take 40 mg by mouth daily. ) 90 tablet 1  . sevelamer carbonate (RENVELA) 800 MG tablet Take 800 mg by mouth See admin instructions. Two to three times daily with meals    . vitamin C (ASCORBIC ACID) 500 MG tablet Take 500 mg by mouth daily.    . Vitamin D, Ergocalciferol, (DRISDOL) 1.25 MG (50000 UT) CAPS capsule Take 1 capsule (50,000 Units total) by mouth every Thursday. 12 capsule 3  . warfarin (COUMADIN) 3 MG tablet TAKE 1/2 TO 1 TABLET BY  MOUTH DAILY AS DIRECTED BY  COUMADIN CLINIC (Patient taking differently: Take 1.5-3 mg by mouth See admin instructions. Take 0.5 tablet (1.5 mg  totally) by mouth on Sun, Mon, Wed, and Friday; Take 1 tablet (3 mg totally) by mouth on Tues, Thurs, Sat.) 90 tablet 1    ROS: As per HPI otherwise negative.  Physical Exam: Vitals:   05/20/18 1045 05/20/18 1100 05/20/18 1130 05/20/18 1230  BP:  (!) 144/56 (!) 122/52 (!) 124/50  Pulse: 65 69 66 70  Resp: 16 (!) 21 17 (!) 22  Temp:      TempSrc:      SpO2: 97% 98% 98% 100%  Weight:      Height:         General: WDWN elderly female NAD on nasal oxygen  Head: NCAT sclera not icteric MMM Neck: Supple.  Lungs: CTA bilaterally without wheezes, rales, or rhonchi.  Heart: RRR with S1 S2 Abdomen: soft NT + BS Lower extremities: Trace LE edema bilat.  Neuro: A & O  X 3. Moves all extremities spontaneously. Psych:  Responds to questions appropriately with a normal affect. Dialysis Access: RUA AVG +bruit   Labs: Basic Metabolic Panel: Recent Labs  Lab 05/20/18 0925  NA 137  K 3.6  CL 92*  CO2 30  GLUCOSE 88  BUN 14  CREATININE 4.00*  CALCIUM 8.2*   Liver Function Tests: No results for input(s): AST, ALT, ALKPHOS, BILITOT, PROT, ALBUMIN in the last 168 hours. No results for input(s): LIPASE, AMYLASE in the last 168 hours. No results for input(s): AMMONIA in the last 168 hours. CBC: Recent Labs  Lab 05/20/18 0925  WBC 6.6  NEUTROABS 4.3  HGB 10.8*  HCT 33.8*  MCV 104.0*  PLT 172   Cardiac Enzymes: No results for input(s): CKTOTAL, CKMB, CKMBINDEX, TROPONINI in the last 168 hours. CBG: No results for input(s): GLUCAP in the last 168 hours. Iron Studies: No results for input(s): IRON, TIBC, TRANSFERRIN, FERRITIN in the last 72 hours. Studies/Results: Dg Chest 1 View  Result Date: 05/20/2018 CLINICAL DATA:  Fall EXAM: CHEST  1 VIEW COMPARISON:  12/14/2017 FINDINGS: Left subclavian pacemaker device and leads are stable. Right jugular dialysis catheter has been removed. The heart remains moderately enlarged. Mild vascular congestion is stable. Chronic blunting of the  left costophrenic angle is unchanged. No new consolidation or lung mass. Status post midthoracic vertebral augmentation. Chronic appearing right-sided rib deformities are stable. IMPRESSION: Stable cardiomegaly and mild vascular congestion. Electronically Signed   By: Marybelle Killings M.D.   On: 05/20/2018 08:49  Dg Hip Unilat W Or Wo Pelvis 2-3 Views Left  Result Date: 05/20/2018 CLINICAL DATA:  Fall.  Left hip pain. EXAM: DG HIP (WITH OR WITHOUT PELVIS) 2-3V LEFT COMPARISON:  05/15/2018. FINDINGS: Diffuse osteopenia and degenerative change. No acute abnormality identified. No evidence of fracture dislocation. Peripheral vascular calcification. IMPRESSION: Diffuse osteopenia degenerative change. No acute abnormality identified. Electronically Signed   By: Marcello Moores  Register   On: 05/20/2018 08:48    Dialysis Orders:  NW TTS 4h 350/600 EDW 52kg 2K/2Ca  R AVG Hep 1400 U  Hectorol 4 mcg TIW Venofer 50mg  IV q week   Assessment/Plan: 1. L hip pain s/p fall at home - imaging neg for fx/acute changes. Pain meds per primary  2. Dyspnea/volume overload - 5kg over EDW by weights here. Usually doesn't tolerate >2L UF as outpatient. Extra HD today, gradually titrate volume down as tolerated. 3. ESRD -  Usually TTS HD. Short treatment today for volume removal  4. Hypertension - BP stable. On Coreg 3.125 bid.  5. Anemia  - Hgb 10.8 No ESA needs currently  6. Metabolic bone disease -  Continue VDRA/Renvela binder  7. Afib- on amio/Coumadin   Lynnda Child PA-C [place] Pager [phone] 05/20/2018, 1:33 PM

## 2018-05-21 DIAGNOSIS — J9621 Acute and chronic respiratory failure with hypoxia: Secondary | ICD-10-CM | POA: Diagnosis not present

## 2018-05-21 LAB — BASIC METABOLIC PANEL
Anion gap: 9 (ref 5–15)
BUN: 6 mg/dL — ABNORMAL LOW (ref 8–23)
CO2: 30 mmol/L (ref 22–32)
Calcium: 8.8 mg/dL — ABNORMAL LOW (ref 8.9–10.3)
Chloride: 99 mmol/L (ref 98–111)
Creatinine, Ser: 2.88 mg/dL — ABNORMAL HIGH (ref 0.44–1.00)
GFR calc non Af Amer: 14 mL/min — ABNORMAL LOW (ref >60)
GFR, EST AFRICAN AMERICAN: 17 mL/min — AB (ref >60)
Glucose, Bld: 92 mg/dL (ref 70–99)
Potassium: 4.4 mmol/L (ref 3.5–5.1)
SODIUM: 138 mmol/L (ref 135–145)

## 2018-05-21 LAB — CBC
HCT: 34.5 % — ABNORMAL LOW (ref 36.0–46.0)
Hemoglobin: 11.1 g/dL — ABNORMAL LOW (ref 12.0–15.0)
MCH: 33.5 pg (ref 26.0–34.0)
MCHC: 32.2 g/dL (ref 30.0–36.0)
MCV: 104.2 fL — ABNORMAL HIGH (ref 80.0–100.0)
Platelets: 174 10*3/uL (ref 150–400)
RBC: 3.31 MIL/uL — ABNORMAL LOW (ref 3.87–5.11)
RDW: 14 % (ref 11.5–15.5)
WBC: 6.1 10*3/uL (ref 4.0–10.5)
nRBC: 0 % (ref 0.0–0.2)

## 2018-05-21 LAB — PROTIME-INR
INR: 3.11
Prothrombin Time: 31.6 seconds — ABNORMAL HIGH (ref 11.4–15.2)

## 2018-05-21 MED ORDER — ACETAMINOPHEN 500 MG PO TABS
ORAL_TABLET | ORAL | Status: AC
  Filled 2018-05-21: qty 2

## 2018-05-21 MED ORDER — HEPARIN SODIUM (PORCINE) 1000 UNIT/ML IJ SOLN
INTRAMUSCULAR | Status: AC
  Administered 2018-05-21: 1100 [IU] via INTRAVENOUS_CENTRAL
  Filled 2018-05-21: qty 2

## 2018-05-21 MED ORDER — SODIUM CHLORIDE 0.9 % IV SOLN: 100.0000 mL | INTRAVENOUS | Status: DC | PRN

## 2018-05-21 MED ORDER — SEVELAMER CARBONATE 800 MG PO TABS
1600.0000 mg | ORAL_TABLET | Freq: Three times a day (TID) | ORAL | Status: DC
  Administered 2018-05-21 – 2018-05-22 (×3): 1600 mg via ORAL
  Filled 2018-05-21 (×3): qty 2

## 2018-05-21 MED ORDER — HEPARIN SODIUM (PORCINE) 1000 UNIT/ML DIALYSIS
20.0000 [IU]/kg | INTRAMUSCULAR | Status: DC | PRN
  Administered 2018-05-21: 1100 [IU] via INTRAVENOUS_CENTRAL

## 2018-05-21 MED ORDER — DOXERCALCIFEROL 4 MCG/2ML IV SOLN
4.0000 ug | INTRAVENOUS | Status: DC
  Administered 2018-05-21: 4 ug via INTRAVENOUS

## 2018-05-21 MED ORDER — DOXERCALCIFEROL 4 MCG/2ML IV SOLN
INTRAVENOUS | Status: AC
  Administered 2018-05-21: 4 ug via INTRAVENOUS
  Filled 2018-05-21: qty 2

## 2018-05-21 MED ORDER — WARFARIN - PHARMACIST DOSING INPATIENT: Freq: Every day | Status: DC

## 2018-05-21 MED ORDER — HEPARIN SODIUM (PORCINE) 1000 UNIT/ML DIALYSIS: 1000.0000 [IU] | INTRAMUSCULAR | Status: DC | PRN

## 2018-05-21 NOTE — Progress Notes (Signed)
ANTICOAGULATION CONSULT NOTE - Initial Consult  Pharmacy Consult for warfarin Indication: atrial fibrillation  Allergies  Allergen Reactions  . Clonidine Derivatives Palpitations and Other (See Comments)    VERY SEDATED  . Adhesive [Tape] Other (See Comments)    Tears skin up  . Codeine Nausea Only  . Nickel Rash  . Sulfa Antibiotics Other (See Comments)    Reaction unknown    Patient Measurements: Height: 4\' 11"  (149.9 cm) Weight: 127 lb 13.9 oz (58 kg) IBW/kg (Calculated) : 43.2 Heparin Dosing Weight: n/a   Vital Signs: Temp: 98.5 F (36.9 C) (01/04 1317) Temp Source: Oral (01/04 1317) BP: 134/50 (01/04 1430) Pulse Rate: 67 (01/04 1430)  Labs: Recent Labs    05/20/18 0925 05/21/18 0623  HGB 10.8* 11.1*  HCT 33.8* 34.5*  PLT 172 174  LABPROT  --  31.6*  INR  --  3.11  CREATININE 4.00* 2.88*    Estimated Creatinine Clearance: 11.5 mL/min (A) (by C-G formula based on SCr of 2.88 mg/dL (H)).   Medical History: Past Medical History:  Diagnosis Date  . Anemia of chronic disease 2018   + anemia of CRI?  [name] Atrial fibrillation ([place]) [I48.91] 12/11/2016  . Atrophy of left kidney    with absent blood flow by renal artery dopplers (Dr. Gwenlyn Found)  . 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 Chronic combined systolic and diastolic CHF (congestive heart failure) ([place])    a. 11/2016: echo showing EF of 35-40%, RV strain noted, mild MR and mild TR.   Echo showed much improved EF 12/2017 (55-60%)  . Chronic respiratory failure with hypoxia ([place]) 02/11/2017   Now on chronic O2 (intermittent as of summer 2019).    . Debilitated patient    WC dependent as of 2018  . ESRD on hemodialysis ([place]) 01/2017   T/Th/Sat schedule- [place].  Right basilic AV fistula 25/6389--HTD unable to successfully access this.  Dr. Donnetta Hutching to do an upper arm AV graft as of 10/2017..  . History of adenomatous polyp of colon   . History of kidney stones    passed  . History of  subarachnoid hemorrhage 10/2014   after syncope and while on xarelto  . HOH (hard of hearing)   . Hyperlipidemia   . Hypertension    Difficult to control, in the setting of one functioning kidney: pt was referred to nephrology by Dr. Gwenlyn Found 06/2015.  . Lumbar radiculopathy 2012  . Lumbar spinal stenosis 2019   facet injections only very short term relief.  L1 and L2 selective nerve root blocks helpful 04/2018.  . Lumbar spondylosis    MR 07/2016---no sign of spinal nerve compression or cord compression.  Pt set up with outpt ortho while admitted to hosp 07/2016.  . Malnourished ([place])   . Metatarsal fracture 06/10/2016   Nondisplaced, left 5th metatarsal--pt was referred to ortho  . Nonischemic cardiomyopathy ([place])    EF improved to 55-60% on echo 12/2017  . Osteopenia 2014   T-score -2.1  . PAF (paroxysmal atrial fibrillation) (HCC)    Eliquis started after BRAO and CVA.  She was changed to warfarin 2018--INR/coumadin mgmt per HD/nephrol.  . Presence of permanent cardiac pacemaker   . Right rib fracture 12/2016   s/p fall  . Sick sinus syndrome ([place]) 11/2016   Dual chamber pacer insertion 2018 (Dr. Lovena Le)  . Stroke ([place]) 42/8768   cardioembolic (had CVA while on no anticoag)--"scattered subacute punctate infarcts: 1 in R parietal lobe and  2 in occipital cortex" on MRI br.  CT angio head/neck: aortic arch athero.  R ICA 20% stenosis, L ICA w/out any stenosis.  . Thoracic back pain 01/2017; 01/2018   2018: Facet?  Dr. Ramos->steroid injection.  01/2018-->CT T spine to check for a new comp fx-->none found.  Selective nerve root inj in T spine helpful on R side.   . Thoracic compression fracture ([place]) 07/2016   T3.  T7 and T8-- T8 kyphoplasty during hosp admission 07/2016.  Neuro referred pt to pain mgmt for consideration of injection 09/2016.  I referred her to endo 08/2016 for consideration of calcitonin treatment.    Medications:  Medications Prior to Admission  Medication Sig Dispense  Refill Last Dose  . acetaminophen (TYLENOL) 500 MG tablet Take 500-1,000 mg 3 (three) times daily as needed by mouth for moderate pain.    05/20/2018 at Unknown time  . amiodarone (PACERONE) 200 MG tablet TAKE 1 TABLET BY MOUTH  DAILY (Patient taking differently: Take 200 mg by mouth daily. ) 90 tablet 2 05/20/2018 at Unknown time  . carvedilol (COREG) 3.125 MG tablet Take 1 tablet (3.125 mg total) by mouth 2 (two) times daily. 30 tablet 0 05/20/2018 at 6am  . HYDROcodone-acetaminophen (NORCO) 5-325 MG tablet Take 1 tablet by mouth every 6 (six) hours as needed for moderate pain or severe pain. 60 tablet 0 05/20/2018 at Unknown time  . isosorbide mononitrate (IMDUR) 60 MG 24 hr tablet TAKE 1 TABLET BY MOUTH  DAILY (Patient taking differently: Take 60 mg by mouth daily. ) 90 tablet 1 05/20/2018 at Unknown time  . ondansetron (ZOFRAN) 4 MG tablet Take 0.5-1 tablets (2-4 mg total) by mouth every 8 (eight) hours as needed for nausea or vomiting. 90 tablet 3 unk at prn  . oxyCODONE (OXY IR/ROXICODONE) 5 MG immediate release tablet 1-2 tabs po q6h prn severe pain 30 tablet 0 unk at prn  . polyethylene glycol (MIRALAX / GLYCOLAX) packet Take 17 g by mouth daily as needed for mild constipation.   unk at prn  . pravastatin (PRAVACHOL) 40 MG tablet TAKE 1 TABLET BY MOUTH  EVERY EVENING (Patient taking differently: Take 40 mg by mouth daily. ) 90 tablet 1 05/19/2018 at Unknown time  . sevelamer carbonate (RENVELA) 800 MG tablet Take 800 mg by mouth See admin instructions. Two to three times daily with meals   05/20/2018 at am  . vitamin C (ASCORBIC ACID) 500 MG tablet Take 500 mg by mouth daily.   05/20/2018 at Unknown time  . Vitamin D, Ergocalciferol, (DRISDOL) 1.25 MG (50000 UT) CAPS capsule Take 1 capsule (50,000 Units total) by mouth every Thursday. 12 capsule 3 05/19/2018 at Unknown time  . warfarin (COUMADIN) 3 MG tablet TAKE 1/2 TO 1 TABLET BY  MOUTH DAILY AS DIRECTED BY  COUMADIN CLINIC (Patient taking differently: Take  1.5-3 mg by mouth See admin instructions. Take 0.5 tablet (1.5 mg totally) by mouth on Sun, Mon, Wed, and Friday; Take 1 tablet (3 mg totally) by mouth on Tues, Thurs, Sat.) 90 tablet 1 05/19/2018 at 730pm    Assessment: 70 YOF with ESRD on warfarin at home for h/o AFib. INR remains elevated today at 3.11. H/H low stable, Pt wnl   Goal of Therapy:  INR 2-3 Monitor platelets by anticoagulation protocol: Yes   Plan:  -Hold warfarin today given supratherapeutic INR -Daily PT/INR   Valerie Terrell, PharmD., BCPS Clinical Pharmacist Clinical phone for 05/21/18 until 3:30pm: [phone] If after 3:30pm, please refer  to [facility] for unit-specific pharmacist

## 2018-05-21 NOTE — Progress Notes (Signed)
[facility] Progress Note   Subjective:  Feeling better today. Hip pain improving, was able to ambulate with walker Breathing better. sats good off supp O2   Objective Vitals:   05/20/18 1805 05/20/18 2126 05/21/18 0547 05/21/18 0909  BP: (!) 142/49 (!) 128/41 (!) 132/53 (!) 109/91  Pulse: 81 78 64 72  Resp: 18 16 18 18   Temp: 98.1 F (36.7 C) 99 F (37.2 C) 98.4 F (36.9 C) 99 F (37.2 C)  TempSrc: Oral Oral Oral Oral  SpO2: (!) 84% 93% 91% 96%  Weight:      Height:       Physical Exam General: WNWD female NAD Heart: RRR S1/S2 Lungs: CTAB  Abdomen: soft NT Extremities: No LE edema  Dialysis Access: RUA AVG +bruit   Dialysis Orders:  [facility] TTS 4h 350/600 EDW 52kg 2K/2Ca  R AVG Hep 1400 U  Hectorol 4 mcg TIW Venofer 50mg  IV q week   Assessment/Plan: 1. L hip pain s/p fall at home - imaging neg for fx/acute changes. Pain meds per primary. Improving   2. Dyspnea/volume overload - Improved after HD yesterday. Net UF 2L Off supp O2. UF another 2-2.5L today  3. ESRD -  Usually TTS HD. Had extra treatment yesterday. HD back on schedule today. K 4.4   4. Hypertension - BP stable. On Coreg 3.125 bid.  5. Anemia  - Hgb 10.8 No ESA needs currently  6. Metabolic bone disease -  Continue VDRA/Renvela binder  7. Afib- on amio/Coumadin INR 3.11   Lynnda Child PA-C [facility] Pager [phone] 05/21/2018,9:40 AM  LOS: 0 days   Additional Objective Labs: Basic Metabolic Panel: Recent Labs  Lab 05/20/18 0925 05/21/18 0623  NA 137 138  K 3.6 4.4  CL 92* 99  CO2 30 30  GLUCOSE 88 92  BUN 14 6*  CREATININE 4.00* 2.88*  CALCIUM 8.2* 8.8*   CBC: Recent Labs  Lab 05/20/18 0925 05/21/18 0623  WBC 6.6 6.1  NEUTROABS 4.3  --   HGB 10.8* 11.1*  HCT 33.8* 34.5*  MCV 104.0* 104.2*  PLT 172 174   Blood Culture    Component Value Date/Time   SDES BLOOD LEFT FOREARM 01/13/2017 1256   SPECREQUEST IN PEDIATRIC BOTTLE Blood Culture  adequate volume 01/13/2017 1256   CULT NO GROWTH 5 DAYS 01/13/2017 1256   REPTSTATUS 01/18/2017 FINAL 01/13/2017 1256    Cardiac Enzymes: No results for input(s): CKTOTAL, CKMB, CKMBINDEX, TROPONINI in the last 168 hours. CBG: No results for input(s): GLUCAP in the last 168 hours. Iron Studies: No results for input(s): IRON, TIBC, TRANSFERRIN, FERRITIN in the last 72 hours. Lab Results  Component Value Date   INR 3.11 05/21/2018   INR 2.4 05/17/2018   INR 2.9 04/20/2018   Medications: . sodium chloride    . sodium chloride    . sodium chloride     . amiodarone  200 mg Oral Daily  . carvedilol  3.125 mg Oral BID  . Chlorhexidine Gluconate Cloth  6 each Topical Q0600  . isosorbide mononitrate  60 mg Oral Daily  . pravastatin  40 mg Oral Daily  . sevelamer carbonate  800 mg Oral TID WC  . sodium chloride flush  3 mL Intravenous Q12H  . vitamin C  500 mg Oral Daily  . [START ON 05/26/2018] Vitamin D (Ergocalciferol)  50,000 Units Oral Q Thu  . warfarin  1.5 mg Oral Once per day on Sun Mon Wed Fri  .  warfarin  3 mg Oral Once per day on Tue Thu Sat  . Warfarin - Physician Dosing Inpatient   Does not apply [phone]

## 2018-05-21 NOTE — Evaluation (Signed)
Physical Therapy Evaluation Patient Details Name: Valerie Terrell MRN: [id number] DOB: 02/09/1933 Today's Date: 05/21/2018   History of Present Illness  85 yo female with onset of acute respiratory failure and recent fall with LLE pain that prohibited gait was admitted.  On HD with history of falls over the last year, per daughter has declined SNF placement.  PMHx:  rib fractures, pacemaker, SSS, PAF, HD, SAH, stroke, lumbar spinal stenosis and spondylosis, reduction in vision L eye, cardiomegaly  Clinical Impression  Pt was seen for initial eval and noted her LLE strength is reduced generally but is in RLE as well.  Mod incoordination of LLE which may have preceded fall to the L at home when leaning forward, but overall is choosing to sit close to the edge of the bed and BSC.  Requires continual reminders for safety and repositioning, and is so continuously in need of cues that she is more appropriate for SNF stay.  May have reached ALF level of care after that and would be worth discussing with case management with family.  Will follow acutely for progression of gait and balance skills while working on strength BLE's and awareness of safety with pt.     Follow Up Recommendations SNF    Equipment Recommendations  Wheelchair (measurements PT);Wheelchair cushion (measurements PT)(if pt cannot go to SNF)    Recommendations for Other Services       Precautions / Restrictions Precautions Precautions: Fall;ICD/Pacemaker Precaution Comments: wires for pacer intact Restrictions Weight Bearing Restrictions: No      Mobility  Bed Mobility Overal bed mobility: Needs Assistance Bed Mobility: Supine to Sit;Sit to Supine     Supine to sit: Min assist Sit to supine: Min guard   General bed mobility comments: pt requires significant cues to get in bed correctly and safely  Transfers Overall transfer level: Needs assistance Equipment used: Rolling walker (2 wheeled);1 person hand held  assist Transfers: Sit to/from Stand Sit to Stand: Mod assist         General transfer comment: mod assist to initiate and then min to mod to remain up esp if one hand is removed from walker  Ambulation/Gait             General Gait Details: walks only the steps to transfer to chair  Stairs            Wheelchair Mobility    Modified Rankin (Stroke Patients Only) Modified Rankin (Stroke Patients Only) Pre-Morbid Rankin Score: Slight disability Modified Rankin: Moderate disability     Balance Overall balance assessment: Needs assistance;History of Falls Sitting-balance support: Feet supported;Single extremity supported Sitting balance-Leahy Scale: Fair     Standing balance support: Bilateral upper extremity supported;During functional activity Standing balance-Leahy Scale: Poor                               Pertinent Vitals/Pain Pain Assessment: Faces Faces Pain Scale: Hurts little more Pain Location: L hip Pain Descriptors / Indicators: Sore Pain Intervention(s): Monitored during session;Repositioned;Premedicated before session    Home Living Family/patient expects to be discharged to:: Skilled nursing facility Living Arrangements: Children Available Help at Discharge: Family Type of Home: House Home Access: Ramped entrance     Home Layout: One level Home Equipment: Bedside commode;Walker - 2 wheels;Cane - single point;Walker - 4 wheels      Prior Function Level of Independence: Independent with assistive device(s)  Comments: has been walking with furniture inside and outside with [facility] until last couple days     Hand Dominance   Dominant Hand: Right    Extremity/Trunk Assessment   Upper Extremity Assessment Upper Extremity Assessment: Generalized weakness    Lower Extremity Assessment Lower Extremity Assessment: Generalized weakness    Cervical / Trunk Assessment Cervical / Trunk Assessment: Kyphotic   Communication   Communication: HOH(augmented hearing devices)  Cognition Arousal/Alertness: Awake/alert Behavior During Therapy: Impulsive Overall Cognitive Status: Difficult to assess                                 General Comments: pt is not clear on her history details but daughter does not comment on it      General Comments General comments (skin integrity, edema, etc.): pt is asking to use [facility] and is unaware of how close to edge of chair she is until prompted to scoot back    Exercises     Assessment/Plan    PT Assessment Patient needs continued PT services  PT Problem List Decreased strength;Decreased range of motion;Decreased activity tolerance;Decreased balance;Decreased mobility;Decreased coordination;Decreased knowledge of use of DME;Decreased safety awareness;Decreased skin integrity;Pain       PT Treatment Interventions DME instruction;Gait training;Functional mobility training;Therapeutic activities;Therapeutic exercise;Balance training;Neuromuscular re-education;Patient/family education    PT Goals (Current goals can be found in the Care Plan section)  Acute Rehab PT Goals Patient Stated Goal: to go directly home PT Goal Formulation: With patient/family Time For Goal Achievement: 06/04/18 Potential to Achieve Goals: Fair    Frequency Min 2X/week   Barriers to discharge Inaccessible home environment has standard home setup with ramp and halls with limited endurance to move    Co-evaluation               AM-PAC PT "6 Clicks" Mobility  Outcome Measure Help needed turning from your back to your side while in a flat bed without using bedrails?: A Little Help needed moving from lying on your back to sitting on the side of a flat bed without using bedrails?: A Little Help needed moving to and from a bed to a chair (including a wheelchair)?: A Little Help needed standing up from a chair using your arms (e.g., wheelchair or bedside chair)?: A  Lot Help needed to walk in hospital room?: A Lot Help needed climbing 3-5 steps with a railing? : Total 6 Click Score: 14    End of Session Equipment Utilized During Treatment: Gait belt Activity Tolerance: Patient limited by fatigue;Patient limited by pain Patient left: in bed;with call bell/phone within reach;with bed alarm set;with family/visitor present Nurse Communication: Mobility status;Other (comment)(talked with nurse about gaps in memory wiht pt) PT Visit Diagnosis: Unsteadiness on feet (R26.81);Repeated falls (R29.6);Muscle weakness (generalized) (M62.81);Difficulty in walking, not elsewhere classified (R26.2);Pain Pain - Right/Left: Left Pain - part of body: Hip    Time: 5053-9767 PT Time Calculation (min) (ACUTE ONLY): 23 min   Charges:   PT Evaluation $PT Eval Moderate Complexity: 1 Mod PT Treatments $Therapeutic Activity: 8-22 mins       Ramond Dial 05/21/2018, 10:37 AM   Mee Hives, PT MS Acute Rehab Dept. Number: [place] and [place]

## 2018-05-21 NOTE — Progress Notes (Signed)
PROGRESS NOTE  Valerie Terrell:[id number] DOB: [birth year] DOA: 05/20/2018 PCP: Tammi Sou, MD  HPI/Recap of past 24 hours:  Valerie Terrell is a 85 y.o. female with medical history significant of end-stage renal disease on dialysis Monday Wednesday Friday, chronic respiratory failure comes in with continue left hip pain has had a CT of her hip and x-rays which have been negative for fractures.  Patient fell a couple days ago and has been having hip pain since.  She is on Vicodin 5 mg orally at home has been taking this twice a day without any relief of her pain.  She cannot walk.  Patient has been set up to get daily dialysis as an outpatient with nephrology however family was unable to get her to dialysis this morning because she cannot get up and move around and put weight on her hip.  Patient has been referred for admission for inability to walk and the need for dialysis.  She is reportedly to be 84% on room air but is on home oxygen.  She denies any shortness of breath or swelling.  05/21/2018: Patient examined at bedside.  Reports her weakness and pain in her hips are improved today.  HD today.  INR supratherapeutic on Coumadin.  PT assessed and recommended SNF.  Patient declines SNF.    Assessment/Plan: Principal Problem:   Acute on chronic respiratory failure with hypoxia (HCC) Active Problems:   PAF (paroxysmal atrial fibrillation) (HCC)   Essential hypertension   Personal history of subarachnoid hemorrhage   History of CVA (cerebrovascular accident)   Cardiac pacemaker   Chronic combined systolic and diastolic heart failure ([place])   Debilitated patient   ESRD on dialysis ([place])   ESRD (end stage renal disease) on dialysis (HCC)  Fluid overload in the setting of ESRD on dialysis Tuesday Thursday Saturday (HCC)-HD today Volume status and electrolytes per nephrology  PAF (paroxysmal atrial fibrillation) ([place]) currently rate controlled.  On Coumadin and supratherapeutic.   Pharmacy managing Coumadin.  Continue to monitor on telemetry  Essential hypertension-stable.  Continue home medications.  Physical debility/ambulatory dysfunction.  Uses a walker at home PT recommended a wheelchair and SNF.  Patient declines SNF.  Moderate to severe T3 compression fracture as seen on imaging on 02/14/2018.  As well as T8 compression fracture and spinal stenosis at T7 and T8 Patient is on chronic pain medications, continue her home regimen.  Personal history of subarachnoid hemorrhage-noted  Supratherapeutic INR INR 3.11 Coumadin managed by pharmacy Goal INR between 2 and 3.   History of CVA (cerebrovascular accident)-stable   Cardiac pacemaker-noted    Chronic combined systolic and diastolic heart failure (HCC)-compensated at this time  Hip pain-no acute fracture.    Pain management with oxycodone 10 mg every 4 hours as needed for pain increased from her normal Vicodin.  She is on MiraLAX as needed for bowel regimen    Objective: Vitals:   05/21/18 1317 05/21/18 1329 05/21/18 1400 05/21/18 1430  BP: 94/84 (!) 133/54 (!) 147/53 (!) 134/50  Pulse: (!) 58 68 67 67  Resp: 16     Temp: 98.5 F (36.9 C)     TempSrc: Oral     SpO2: 98%     Weight: 58 kg     Height:        Intake/Output Summary (Last 24 hours) at 05/21/2018 1501 Last data filed at 05/21/2018 0900 Gross per 24 hour  Intake 340 ml  Output 2050 ml  Net -1710 ml  Filed Weights   05/20/18 0744 05/21/18 1317  Weight: 57 kg 58 kg    Exam:  . General: 85 y.o. year-old female well developed well nourished in no acute distress.  Alert and oriented x3. . Cardiovascular: Regular rate and rhythm with no rubs or gallops.  No thyromegaly or JVD noted.   [name] Respiratory: Clear to auscultation with no wheezes or rales. Good inspiratory effort. . Abdomen: Soft nontender nondistended with normal bowel sounds x4 quadrants. . Musculoskeletal: No lower extremity edema. 2/4 pulses in all 4  extremities. [name] Psychiatry: Mood is appropriate for condition and setting   Data Reviewed: CBC: Recent Labs  Lab 05/20/18 0925 05/21/18 0623  WBC 6.6 6.1  NEUTROABS 4.3  --   HGB 10.8* 11.1*  HCT 33.8* 34.5*  MCV 104.0* 104.2*  PLT 172 665   Basic Metabolic Panel: Recent Labs  Lab 05/20/18 0925 05/21/18 0623  NA 137 138  K 3.6 4.4  CL 92* 99  CO2 30 30  GLUCOSE 88 92  BUN 14 6*  CREATININE 4.00* 2.88*  CALCIUM 8.2* 8.8*   GFR: Estimated Creatinine Clearance: 11.5 mL/min (A) (by C-G formula based on SCr of 2.88 mg/dL (H)). Liver Function Tests: No results for input(s): AST, ALT, ALKPHOS, BILITOT, PROT, ALBUMIN in the last 168 hours. No results for input(s): LIPASE, AMYLASE in the last 168 hours. No results for input(s): AMMONIA in the last 168 hours. Coagulation Profile: Recent Labs  Lab 05/17/18 0830 05/21/18 0623  INR 2.4 3.11   Cardiac Enzymes: No results for input(s): CKTOTAL, CKMB, CKMBINDEX, TROPONINI in the last 168 hours. BNP (last 3 results) No results for input(s): PROBNP in the last 8760 hours. HbA1C: No results for input(s): HGBA1C in the last 72 hours. CBG: No results for input(s): GLUCAP in the last 168 hours. Lipid Profile: No results for input(s): CHOL, HDL, LDLCALC, TRIG, CHOLHDL, LDLDIRECT in the last 72 hours. Thyroid Function Tests: No results for input(s): TSH, T4TOTAL, FREET4, T3FREE, THYROIDAB in the last 72 hours. Anemia Panel: No results for input(s): VITAMINB12, FOLATE, FERRITIN, TIBC, IRON, RETICCTPCT in the last 72 hours. Urine analysis:    Component Value Date/Time   COLORURINE YELLOW 01/20/2017 1737   APPEARANCEUR CLEAR 01/20/2017 1737   LABSPEC 1.012 01/20/2017 1737   PHURINE 5.0 01/20/2017 1737   GLUCOSEU NEGATIVE 01/20/2017 1737   HGBUR NEGATIVE 01/20/2017 1737   BILIRUBINUR NEGATIVE 01/20/2017 1737   BILIRUBINUR negative 09/24/2016 [place] 01/20/2017 1737   PROTEINUR 30 (A) 01/20/2017 1737    UROBILINOGEN 0.2 09/24/2016 1453   NITRITE NEGATIVE 01/20/2017 1737   LEUKOCYTESUR NEGATIVE 01/20/2017 1737   Sepsis Labs: @LABRCNTIP (procalcitonin:4,lacticidven:4)  ) Recent Results (from the past 240 hour(s))  MRSA PCR Screening     Status: None   Collection Time: 05/20/18  6:42 PM  Result Value Ref Range Status   MRSA by PCR NEGATIVE NEGATIVE Final    Comment:        The GeneXpert MRSA Assay (FDA approved for NASAL specimens only), is one component of a comprehensive MRSA colonization surveillance program. It is not intended to diagnose MRSA infection nor to guide or monitor treatment for MRSA infections. Performed at [facility], [place] [address], [place], [place] [address]       Studies: No results found.  Scheduled Meds: . amiodarone  200 mg Oral Daily  . carvedilol  3.125 mg Oral BID  . Chlorhexidine Gluconate Cloth  6 each Topical Q0600  . doxercalciferol  4 mcg Intravenous Q T,Th,Sa-HD  . heparin      . isosorbide mononitrate  60 mg Oral Daily  . pravastatin  40 mg Oral Daily  . sevelamer carbonate  1,600 mg Oral TID WC  . sodium chloride flush  3 mL Intravenous Q12H  . vitamin C  500 mg Oral Daily  . [START ON 05/26/2018] Vitamin D (Ergocalciferol)  50,000 Units Oral Q Thu  . warfarin  1.5 mg Oral Once per day on Sun Mon Wed Fri  . warfarin  3 mg Oral Once per day on Tue Thu Sat  . Warfarin - Physician Dosing Inpatient   Does not apply q1800    Continuous Infusions: . sodium chloride    . sodium chloride    . sodium chloride    . sodium chloride    . sodium chloride       LOS: 0 days     Kayleen Memos, MD [facility] Pager [phone]  If 7PM-7AM, please contact night-coverage [email or web address] Password [email or web address] 05/21/2018, 3:01 PM

## 2018-05-22 ENCOUNTER — Encounter (HOSPITAL_COMMUNITY): Payer: Self-pay | Admitting: *Deleted

## 2018-05-22 ENCOUNTER — Other Ambulatory Visit: Payer: Self-pay

## 2018-05-22 DIAGNOSIS — J9601 Acute respiratory failure with hypoxia: Secondary | ICD-10-CM | POA: Diagnosis not present

## 2018-05-22 DIAGNOSIS — I5041 Acute combined systolic (congestive) and diastolic (congestive) heart failure: Secondary | ICD-10-CM | POA: Diagnosis not present

## 2018-05-22 DIAGNOSIS — R5381 Other malaise: Secondary | ICD-10-CM | POA: Diagnosis not present

## 2018-05-22 DIAGNOSIS — Z789 Other specified health status: Secondary | ICD-10-CM | POA: Diagnosis not present

## 2018-05-22 DIAGNOSIS — J9621 Acute and chronic respiratory failure with hypoxia: Secondary | ICD-10-CM | POA: Diagnosis not present

## 2018-05-22 LAB — CBC
HCT: 35.7 % — ABNORMAL LOW (ref 36.0–46.0)
HEMOGLOBIN: 11.6 g/dL — AB (ref 12.0–15.0)
MCH: 33.5 pg (ref 26.0–34.0)
MCHC: 32.5 g/dL (ref 30.0–36.0)
MCV: 103.2 fL — ABNORMAL HIGH (ref 80.0–100.0)
Platelets: 195 10*3/uL (ref 150–400)
RBC: 3.46 MIL/uL — ABNORMAL LOW (ref 3.87–5.11)
RDW: 13.9 % (ref 11.5–15.5)
WBC: 7.4 10*3/uL (ref 4.0–10.5)
nRBC: 0 % (ref 0.0–0.2)

## 2018-05-22 LAB — PROTIME-INR
INR: 2.51
Prothrombin Time: 26.7 seconds — ABNORMAL HIGH (ref 11.4–15.2)

## 2018-05-22 LAB — BASIC METABOLIC PANEL
Anion gap: 10 (ref 5–15)
BUN: 10 mg/dL (ref 8–23)
CHLORIDE: 95 mmol/L — AB (ref 98–111)
CO2: 31 mmol/L (ref 22–32)
Calcium: 8.6 mg/dL — ABNORMAL LOW (ref 8.9–10.3)
Creatinine, Ser: 2.93 mg/dL — ABNORMAL HIGH (ref 0.44–1.00)
GFR calc Af Amer: 16 mL/min — ABNORMAL LOW (ref >60)
GFR calc non Af Amer: 14 mL/min — ABNORMAL LOW (ref >60)
Glucose, Bld: 96 mg/dL (ref 70–99)
Potassium: 3.7 mmol/L (ref 3.5–5.1)
Sodium: 136 mmol/L (ref 135–145)

## 2018-05-22 MED ORDER — WARFARIN SODIUM 1 MG PO TABS
1.5000 mg | ORAL_TABLET | Freq: Once | ORAL | Status: DC
  Filled 2018-05-22: qty 1

## 2018-05-22 NOTE — Care Management Note (Addendum)
Case Management Note  Patient Details  Name: Valerie Terrell MRN: [id number] Date of Birth: February 10, 1933  Subjective/Objective:                    Action/Plan: Pt refused HH per Dr. Nevada Crane.  WC ordered from [facility] to be delivered to room prior to dc.  Expected Discharge Date:  05/22/18               Expected Discharge Plan:  [facility]  In-House Referral:  NA  Discharge planning Services  CM Consult  Post Acute Care Choice:  Durable Medical Equipment, Home Health Choice offered to:  Patient  DME Arranged:  Wheelchair manual DME Agency:  [place]:  PT, OT, RN, NA, RT [place] Agency:   [facility]  Status of Service:  Completed, signed off  If discussed at [place] of Stay Meetings, dates discussed:    Additional Comments: Addendum: [id number]: Pt and daughter did not understand what was being asked earlier and do want [place] services.  Patient has used [facility] in the past and wishes to use again.  Referral called to [facility] with [facility].   Claudie Leach, RN 05/22/2018, 1:13 PM

## 2018-05-22 NOTE — Progress Notes (Signed)
[facility] Progress Note   Subjective:  Seen in room. Feels better. Hip pain improved. Ready to go home HD yesterday Only tolerated 1.8L UF   Objective Vitals:   05/21/18 1741 05/21/18 2127 05/22/18 0130 05/22/18 0347  BP: (!) 101/37 (!) 110/40  (!) 128/53  Pulse: 66 69  63  Resp: 18 16  18   Temp: 97.6 F (36.4 C) 98.1 F (36.7 C)  98.8 F (37.1 C)  TempSrc: Oral Oral  Oral  SpO2: 91% 92%  96%  Weight:   55.3 kg   Height:       Physical Exam General: WNWD female NAD Heart: RRR S1/S2 Lungs: CTAB  Abdomen: soft NT Extremities: No LE edema  Dialysis Access: RUA AVG +bruit   Dialysis Orders:  NW TTS 4h 350/600 EDW 52kg 2K/2Ca  R AVG Hep 1400 U  Hectorol 4 mcg TIW Venofer 50mg  IV q week   Assessment/Plan: 1. L hip pain s/p fall at home - imaging neg for fx/acute changes. Pain meds per primary. Improving. PT recommends SNF but patient prefers to go home.    2. Dyspnea/volume overload - Improved  With HD 1/3 and 1/4  Net UF 3.8L off during admit. Off supp O2.  3. ESRD -  Usually TTS HD. Back on schedule. Next HD 1/7 at outpatient center.  4. Hypertension - BP stable. On Coreg 3.125 bid.  5. Anemia  - Hgb 10.8 No ESA needs currently  6. Metabolic bone disease -  Continue VDRA/Renvela binder  7. Afib- on amio/Coumadin INR 3.11  8. Dispo: Ok for discharge from renal standpoint   Lynnda Child [facility] Pager [phone] 05/22/2018,8:53 AM  LOS: 0 days   Additional Objective Labs: Basic Metabolic Panel: Recent Labs  Lab 05/20/18 0925 05/21/18 0623 05/22/18 0407  NA 137 138 136  K 3.6 4.4 3.7  CL 92* 99 95*  CO2 30 30 31   GLUCOSE 88 92 96  BUN 14 6* 10  CREATININE 4.00* 2.88* 2.93*  CALCIUM 8.2* 8.8* 8.6*   CBC: Recent Labs  Lab 05/20/18 0925 05/21/18 0623 05/22/18 0407  WBC 6.6 6.1 7.4  NEUTROABS 4.3  --   --   HGB 10.8* 11.1* 11.6*  HCT 33.8* 34.5* 35.7*  MCV 104.0* 104.2* 103.2*  PLT 172 174 195    Blood Culture    Component Value Date/Time   SDES BLOOD LEFT FOREARM 01/13/2017 1256   SPECREQUEST IN PEDIATRIC BOTTLE Blood Culture adequate volume 01/13/2017 1256   CULT NO GROWTH 5 DAYS 01/13/2017 1256   REPTSTATUS 01/18/2017 FINAL 01/13/2017 1256    Cardiac Enzymes: No results for input(s): CKTOTAL, CKMB, CKMBINDEX, TROPONINI in the last 168 hours. CBG: No results for input(s): GLUCAP in the last 168 hours. Iron Studies: No results for input(s): IRON, TIBC, TRANSFERRIN, FERRITIN in the last 72 hours. Lab Results  Component Value Date   INR 2.51 05/22/2018   INR 3.11 05/21/2018   INR 2.4 05/17/2018   Medications: . sodium chloride    . sodium chloride    . sodium chloride    . sodium chloride    . sodium chloride     . amiodarone  200 mg Oral Daily  . carvedilol  3.125 mg Oral BID  . doxercalciferol  4 mcg Intravenous Q T,Th,Sa-HD  . isosorbide mononitrate  60 mg Oral Daily  . pravastatin  40 mg Oral Daily  . sevelamer carbonate  1,600 mg Oral TID WC  . sodium chloride flush  3 mL Intravenous Q12H  . vitamin C  500 mg Oral Daily  . [START ON 05/26/2018] Vitamin D (Ergocalciferol)  50,000 Units Oral Q Thu  . Warfarin - Pharmacist Dosing Inpatient   Does not apply [phone]

## 2018-05-22 NOTE — Progress Notes (Signed)
[place] for warfarin Indication: atrial fibrillation  Allergies  Allergen Reactions  . Clonidine Derivatives Palpitations and Other (See Comments)    VERY SEDATED  . Adhesive [Tape] Other (See Comments)    Tears skin up  . Codeine Nausea Only  . Nickel Rash  . Sulfa Antibiotics Other (See Comments)    Reaction unknown    Patient Measurements: Height: 4\' 11"  (149.9 cm) Weight: 121 lb 14.6 oz (55.3 kg) IBW/kg (Calculated) : 43.2   Vital Signs: Temp: 98.8 F (37.1 C) (01/05 0347) Temp Source: Oral (01/05 0347) BP: 128/53 (01/05 0347) Pulse Rate: 63 (01/05 0347)  Labs: Recent Labs    05/20/18 0925 05/21/18 0623 05/22/18 0407  HGB 10.8* 11.1* 11.6*  HCT 33.8* 34.5* 35.7*  PLT 172 174 195  LABPROT  --  31.6* 26.7*  INR  --  3.11 2.51  CREATININE 4.00* 2.88* 2.93*    Estimated Creatinine Clearance: 11 mL/min (A) (by C-G formula based on SCr of 2.93 mg/dL (H)).   Medical History: Past Medical History:  Diagnosis Date  . Anemia of chronic disease 2018   + anemia of CRI?  [name] Atrial fibrillation ([place]) [I48.91] 12/11/2016  . Atrophy of left kidney    with absent blood flow by renal artery dopplers (Dr. Gwenlyn Found)  . 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 Chronic combined systolic and diastolic CHF (congestive heart failure) ([place])    a. 11/2016: echo showing EF of 35-40%, RV strain noted, mild MR and mild TR.   Echo showed much improved EF 12/2017 (55-60%)  . Chronic respiratory failure with hypoxia ([place]) 02/11/2017   Now on chronic O2 (intermittent as of summer 2019).    . Debilitated patient    WC dependent as of 2018  . ESRD on hemodialysis ([place]) 01/2017   T/Th/Sat schedule- [facility].  Right basilic AV fistula 35/5732--[facility] unable to successfully access this.  Dr. Donnetta Hutching to do an upper arm AV graft as of 10/2017..  . History of adenomatous polyp of colon   . History of kidney stones    passed  . History of  subarachnoid hemorrhage 10/2014   after syncope and while on xarelto  . HOH (hard of hearing)   . Hyperlipidemia   . Hypertension    Difficult to control, in the setting of one functioning kidney: pt was referred to nephrology by Dr. Gwenlyn Found 06/2015.  . Lumbar radiculopathy 2012  . Lumbar spinal stenosis 2019   facet injections only very short term relief.  L1 and L2 selective nerve root blocks helpful 04/2018.  . Lumbar spondylosis    MR 07/2016---no sign of spinal nerve compression or cord compression.  Pt set up with outpt ortho while admitted to hosp 07/2016.  . Malnourished ([place])   . Metatarsal fracture 06/10/2016   Nondisplaced, left 5th metatarsal--pt was referred to ortho  . Nonischemic cardiomyopathy ([place])    EF improved to 55-60% on echo 12/2017  . Osteopenia 2014   T-score -2.1  . PAF (paroxysmal atrial fibrillation) (HCC)    Eliquis started after BRAO and CVA.  She was changed to warfarin 2018--INR/coumadin mgmt per HD/nephrol.  . Presence of permanent cardiac pacemaker   . Right rib fracture 12/2016   s/p fall  . Sick sinus syndrome ([place]) 11/2016   Dual chamber pacer insertion 2018 (Dr. Lovena Le)  . Stroke ([place]) 54/2706   cardioembolic (had CVA while on no anticoag)--"scattered subacute punctate infarcts: 1 in R parietal lobe  and 2 in occipital cortex" on MRI br.  CT angio head/neck: aortic arch athero.  R ICA 20% stenosis, L ICA w/out any stenosis.  . Thoracic back pain 01/2017; 01/2018   2018: Facet?  Dr. Ramos->steroid injection.  01/2018-->CT T spine to check for a new comp fx-->none found.  Selective nerve root inj in T spine helpful on R side.   . Thoracic compression fracture ([place]) 07/2016   T3.  T7 and T8-- T8 kyphoplasty during hosp admission 07/2016.  Neuro referred pt to pain mgmt for consideration of injection 09/2016.  I referred her to endo 08/2016 for consideration of calcitonin treatment.    Medications:  Medications Prior to Admission  Medication Sig Dispense  Refill Last Dose  . acetaminophen (TYLENOL) 500 MG tablet Take 500-1,000 mg 3 (three) times daily as needed by mouth for moderate pain.    05/20/2018 at Unknown time  . amiodarone (PACERONE) 200 MG tablet TAKE 1 TABLET BY MOUTH  DAILY (Patient taking differently: Take 200 mg by mouth daily. ) 90 tablet 2 05/20/2018 at Unknown time  . carvedilol (COREG) 3.125 MG tablet Take 1 tablet (3.125 mg total) by mouth 2 (two) times daily. 30 tablet 0 05/20/2018 at 6am  . HYDROcodone-acetaminophen (NORCO) 5-325 MG tablet Take 1 tablet by mouth every 6 (six) hours as needed for moderate pain or severe pain. 60 tablet 0 05/20/2018 at Unknown time  . isosorbide mononitrate (IMDUR) 60 MG 24 hr tablet TAKE 1 TABLET BY MOUTH  DAILY (Patient taking differently: Take 60 mg by mouth daily. ) 90 tablet 1 05/20/2018 at Unknown time  . ondansetron (ZOFRAN) 4 MG tablet Take 0.5-1 tablets (2-4 mg total) by mouth every 8 (eight) hours as needed for nausea or vomiting. 90 tablet 3 unk at prn  . oxyCODONE (OXY IR/ROXICODONE) 5 MG immediate release tablet 1-2 tabs po q6h prn severe pain 30 tablet 0 unk at prn  . polyethylene glycol (MIRALAX / GLYCOLAX) packet Take 17 g by mouth daily as needed for mild constipation.   unk at prn  . pravastatin (PRAVACHOL) 40 MG tablet TAKE 1 TABLET BY MOUTH  EVERY EVENING (Patient taking differently: Take 40 mg by mouth daily. ) 90 tablet 1 05/19/2018 at Unknown time  . sevelamer carbonate (RENVELA) 800 MG tablet Take 800 mg by mouth See admin instructions. Two to three times daily with meals   05/20/2018 at am  . vitamin C (ASCORBIC ACID) 500 MG tablet Take 500 mg by mouth daily.   05/20/2018 at Unknown time  . Vitamin D, Ergocalciferol, (DRISDOL) 1.25 MG (50000 UT) CAPS capsule Take 1 capsule (50,000 Units total) by mouth every Thursday. 12 capsule 3 05/19/2018 at Unknown time  . warfarin (COUMADIN) 3 MG tablet TAKE 1/2 TO 1 TABLET BY  MOUTH DAILY AS DIRECTED BY  COUMADIN CLINIC (Patient taking differently: Take  1.5-3 mg by mouth See admin instructions. Take 0.5 tablet (1.5 mg totally) by mouth on Sun, Mon, Wed, and Friday; Take 1 tablet (3 mg totally) by mouth on Tues, Thurs, Sat.) 90 tablet 1 05/19/2018 at 730pm    Assessment: 57 YOF with ESRD on warfarin at home for h/o AFib. Home dose 3mg  TTS, 1.5mg  SuMWF.  INR within goal today at 2.51. CBC wnl and stable. Will resume patient on home warfarin dosing.   Goal of Therapy:  INR 2-3 Monitor platelets by anticoagulation protocol: Yes   Plan:  -Warfarin 1.5 mg PO x1 tonight -Daily PT/INR   Jackson Latino, PharmD PGY1  Pharmacy Resident Phone [phone] 05/22/2018     9:16 AM

## 2018-05-22 NOTE — Discharge Instructions (Signed)

## 2018-05-22 NOTE — Discharge Summary (Signed)
Discharge Summary  Valerie Terrell PPI:[id number] DOB: [birth year]  PCP: Tammi Sou, MD  Admit date: 05/20/2018 Discharge date: 05/22/2018  Time spent: 35 minutes  Recommendations for Outpatient Follow-up:  1. Follow-up with your PCP 2. Follow-up with your cardiologist 3. Follow-up with your nephrologist 4. Follow-up with your pain medicine provider 5. Keep your hemodialysis appointments as scheduled 6. Take your medications as prescribed 7. Fall precautions 8. Continue physical therapy at home  Discharge Diagnoses:  Active Hospital Problems   Diagnosis Date Noted  . Acute on chronic respiratory failure with hypoxia ([place])   . ESRD (end stage renal disease) on dialysis ([place]) 05/20/2018  . ESRD on dialysis ([place]) 02/11/2017  . Debilitated patient 02/07/2017  . Chronic combined systolic and diastolic heart failure ([place]) 01/05/2017  . Cardiac pacemaker   . History of CVA (cerebrovascular accident) 09/15/2016  . Essential hypertension 01/28/2016  . PAF (paroxysmal atrial fibrillation) ([place]) 01/28/2016  . Personal history of subarachnoid hemorrhage 01/28/2016    Resolved Hospital Problems  No resolved problems to display.    Discharge Condition: Stable  Diet recommendation: Resume previous diet dialysis renal diet  Vitals:   05/22/18 0347 05/22/18 0916  BP: (!) 128/53 (!) 130/56  Pulse: 63 79  Resp: 18 18  Temp: 98.8 F (37.1 C)   SpO2: 96% 94%    History of present illness:   Valerie Terrell a 85 y.o.femalewith medical history significant ofend-stage renal disease on dialysis Monday Wednesday Friday, chronic respiratory failure comes in with continue left hip pain has had a CT of her hip and x-rays which have been negative for fractures. Patient fell a couple days ago and has been having hip pain since. She is on Vicodin 5 mg orally at home has been taking this twice a day without any relief of her pain. She cannot walk. Patient has been set up to get daily  dialysis as an outpatient with nephrology however family was unable to get her to dialysis this morning because she cannot get up and move around and put weight on her hip. Patient has been referred for admission for inability to walk and the need for dialysis. She is reportedly to be 84% on room air but is on home oxygen. She denies any shortness of breath or swelling.  05/21/2018: HD today.  INR supratherapeutic on Coumadin.  PT assessed and recommended SNF.  Patient declines SNF.  05/22/2018: Patient seen and examined at her bedside.  She has no new complaints.  No acute events overnight.  She wants to go home.  On the day of discharge, the patient was hemodynamically stable.  She will need to follow-up with her primary care provider and nephrologist, keep her hemodialysis appointments as scheduled, continue physical therapy at home and take her medications as prescribed.    Hospital Course:  Principal Problem:   Acute on chronic respiratory failure with hypoxia (HCC) Active Problems:   PAF (paroxysmal atrial fibrillation) (HCC)   Essential hypertension   Personal history of subarachnoid hemorrhage   History of CVA (cerebrovascular accident)   Cardiac pacemaker   Chronic combined systolic and diastolic heart failure ([place])   Debilitated patient   ESRD on dialysis ([place])   ESRD (end stage renal disease) on dialysis ([place])  Fluid overload in the setting of ESRD on dialysis Tuesday Thursday Saturday (HCC)-HD today Volume status and electrolytes per nephrology Keep your hemodialysis appointments  PAF (paroxysmal atrial fibrillation) (HCC)currently rate controlled.  On Coumadin and therapeutic.  Follow-up with your PCP  and cardiologist.  On amiodarone 200 mg daily and Coreg 3.125 mg twice daily.  Essential hypertension-stable.  Continue home medications.  Currently on amiodarone, Coreg, and Imdur grams daily.  Physical debility/ambulatory dysfunction.  PT recommended a wheelchair and  SNF.  Patient declines SNF.  DME wheelchair with cushion ordered.  Home health services with PT OT RN and aide also ordered.  Fall precautions.  Moderate to severe T3 compression fracture as seen on imaging on 02/14/2018.  As well as T8 compression fracture and spinal stenosis at T7 and T8 Patient is on chronic pain medications, continue her home regimen.  Follow-up with your pain clinic.  Personal history of subarachnoid hemorrhage-noted  Resolved supratherapeutic INR INR 2.93 on 05/22/2018 Goal INR between 2 and 3. Repeat INR on Wednesday, 05/25/2018  History of CVA (cerebrovascular accident)-stable continue pravastatin.  Cardiac pacemaker-noted  Chronic combined systolic and diastolic heart failure (HCC)-compensated at this time.  Hip pain-no acute fracture.  Resume home pain regimen.  Follow-up with your pain medicine clinic.    Procedures:  Hemodialysis  Consultations:  Nephrology  Discharge Exam: BP (!) 130/56 (BP Location: Left Arm)   Pulse 79   Temp 98.8 F (37.1 C) (Oral)   Resp 18   Ht 4\' 11"  (1.499 m)   Wt 55.3 kg   SpO2 94%   BMI 24.62 kg/m  . General: 85 y.o. year-old female well developed well nourished in no acute distress.  Alert and oriented x3. . Cardiovascular: Regular rate and rhythm with no rubs or gallops.  No thyromegaly or JVD noted.   [name] Respiratory: Clear to auscultation with no wheezes or rales. Good inspiratory effort. . Abdomen: Soft nontender nondistended with normal bowel sounds x4 quadrants. . Musculoskeletal: No lower extremity edema. 2/4 pulses in all 4 extremities. . Skin: No ulcerative lesions noted or rashes, . Psychiatry: Mood is appropriate for condition and setting  Discharge Instructions You were cared for by a hospitalist during your hospital stay. If you have any questions about your discharge medications or the care you received while you were in the hospital after you are discharged, you can call the unit and asked to  speak with the hospitalist on call if the hospitalist that took care of you is not available. Once you are discharged, your primary care physician will handle any further medical issues. Please note that NO REFILLS for any discharge medications will be authorized once you are discharged, as it is imperative that you return to your primary care physician (or establish a relationship with a primary care physician if you do not have one) for your aftercare needs so that they can reassess your need for medications and monitor your lab values.   Allergies as of 05/22/2018      Reactions   Clonidine Derivatives Palpitations, Other (See Comments)   VERY SEDATED   Adhesive [tape] Other (See Comments)   Tears skin up   Codeine Nausea Only   Nickel Rash   Sulfa Antibiotics Other (See Comments)   Reaction unknown      Medication List    TAKE these medications   acetaminophen 500 MG tablet Commonly known as:  TYLENOL Take 500-1,000 mg 3 (three) times daily as needed by mouth for moderate pain.   amiodarone 200 MG tablet Commonly known as:  PACERONE TAKE 1 TABLET BY MOUTH  DAILY   carvedilol 3.125 MG tablet Commonly known as:  COREG Take 1 tablet (3.125 mg total) by mouth 2 (two) times daily.  HYDROcodone-acetaminophen 5-325 MG tablet Commonly known as:  NORCO Take 1 tablet by mouth every 6 (six) hours as needed for moderate pain or severe pain.   isosorbide mononitrate 60 MG 24 hr tablet Commonly known as:  IMDUR TAKE 1 TABLET BY MOUTH  DAILY   ondansetron 4 MG tablet Commonly known as:  ZOFRAN Take 0.5-1 tablets (2-4 mg total) by mouth every 8 (eight) hours as needed for nausea or vomiting.   oxyCODONE 5 MG immediate release tablet Commonly known as:  Oxy IR/ROXICODONE 1-2 tabs po q6h prn severe pain   polyethylene glycol packet Commonly known as:  MIRALAX / GLYCOLAX Take 17 g by mouth daily as needed for mild constipation.   pravastatin 40 MG tablet Commonly known as:   PRAVACHOL TAKE 1 TABLET BY MOUTH  EVERY EVENING What changed:  when to take this   sevelamer carbonate 800 MG tablet Commonly known as:  RENVELA Take 800 mg by mouth See admin instructions. Two to three times daily with meals   vitamin C 500 MG tablet Commonly known as:  ASCORBIC ACID Take 500 mg by mouth daily.   Vitamin D (Ergocalciferol) 1.25 MG (50000 UT) Caps capsule Commonly known as:  DRISDOL Take 1 capsule (50,000 Units total) by mouth every Thursday.   warfarin 3 MG tablet Commonly known as:  COUMADIN Take as directed. If you are unsure how to take this medication, talk to your nurse or doctor. Original instructions:  TAKE 1/2 TO 1 TABLET BY  MOUTH DAILY AS DIRECTED BY  COUMADIN CLINIC What changed:    how much to take  how to take this  when to take this  additional instructions            Durable Medical Equipment  (From admission, onward)         Start     Ordered   05/21/18 1452  For home use only DME wheelchair cushion (seat and back)  Once     05/21/18 1451   05/21/18 1451  For home use only DME lightweight manual wheelchair with seat cushion  Once    Comments:  Patient suffers from ambulatory dysfunction which impairs their ability to perform daily activities like walking in the home.  A cane or walker will not resolve  issue with performing activities of daily living. A wheelchair will allow patient to safely perform daily activities. Patient is not able to propel themselves in the home using a standard weight wheelchair due to weakness. Patient can self propel in the lightweight wheelchair.  Accessories: elevating leg rests (ELRs), wheel locks, extensions and anti-tippers.   05/21/18 1451         Allergies  Allergen Reactions  . Clonidine Derivatives Palpitations and Other (See Comments)    VERY SEDATED  . Adhesive [Tape] Other (See Comments)    Tears skin up  . Codeine Nausea Only  . Nickel Rash  . Sulfa Antibiotics Other (See Comments)      Reaction unknown   Follow-up Information    McGowen, Adrian Blackwater, MD. Call in 1 day(s).   Specialty:  Family Medicine Why:  Please call for post hospital follow-up appointment. Contact information: [address] [place] [address] [phone]        Lorretta Harp, MD .   Specialties:  Cardiology, Radiology Contact information: [address] [place] [address] [phone]            The results of significant diagnostics  from this hospitalization (including imaging, microbiology, ancillary and laboratory) are listed below for reference.    Significant Diagnostic Studies: Dg Chest 1 View  Result Date: 05/20/2018 CLINICAL DATA:  Fall EXAM: CHEST  1 VIEW COMPARISON:  12/14/2017 FINDINGS: Left subclavian pacemaker device and leads are stable. Right jugular dialysis catheter has been removed. The heart remains moderately enlarged. Mild vascular congestion is stable. Chronic blunting of the left costophrenic angle is unchanged. No new consolidation or lung mass. Status post midthoracic vertebral augmentation. Chronic appearing right-sided rib deformities are stable. IMPRESSION: Stable cardiomegaly and mild vascular congestion. Electronically Signed   By: Marybelle Killings M.D.   On: 05/20/2018 08:49   Ct Hip Left Wo Contrast  Result Date: 05/15/2018 CLINICAL DATA:  Progressive left hip pain since a fall yesterday. EXAM: CT OF THE LEFT HIP WITHOUT CONTRAST TECHNIQUE: Multidetector CT imaging of the left hip was performed according to the standard protocol. Multiplanar CT image reconstructions were also generated. COMPARISON:  Radiographs dated 05/15/2018 FINDINGS: Bones/Joint/Cartilage There is no fracture or dislocation. There is symmetrical joint space narrowing of the left hip. No joint effusion. Muscles and Tendons Negative. Soft tissues Atherosclerosis. Sigmoid diverticulosis. Varicose veins in the anterior left thigh. IMPRESSION: 1. No fracture or  dislocation. 2. Minimal osteoarthritis of the left hip. Electronically Signed   By: Lorriane Shire M.D.   On: 05/15/2018 09:23   Dg Knee Complete 4 Views Left  Result Date: 05/15/2018 CLINICAL DATA:  Patient status post fall. EXAM: LEFT KNEE - COMPLETE 4+ VIEW COMPARISON:  None. FINDINGS: No evidence of fracture, dislocation, or joint effusion. No evidence of arthropathy or other focal bone abnormality. Soft tissues are unremarkable. IMPRESSION: No acute osseous abnormality. Electronically Signed   By: Lovey Newcomer M.D.   On: 05/15/2018 09:12   Dg Hip Unilat W Or Wo Pelvis 2-3 Views Left  Result Date: 05/20/2018 CLINICAL DATA:  Fall.  Left hip pain. EXAM: DG HIP (WITH OR WITHOUT PELVIS) 2-3V LEFT COMPARISON:  05/15/2018. FINDINGS: Diffuse osteopenia and degenerative change. No acute abnormality identified. No evidence of fracture dislocation. Peripheral vascular calcification. IMPRESSION: Diffuse osteopenia degenerative change. No acute abnormality identified. Electronically Signed   By: Marcello Moores  Register   On: 05/20/2018 08:48   Dg Hip Unilat W Or Wo Pelvis 2-3 Views Left  Result Date: 05/15/2018 CLINICAL DATA:  Patient status post fall.  Left leg pain. EXAM: DG HIP (WITH OR WITHOUT PELVIS) 2-3V LEFT COMPARISON:  Left hip radiograph 03/25/2017 FINDINGS: Osseous demineralization. SI joints are unremarkable. Lumbar spine degenerative changes. Vascular calcifications. Mild bilateral hip joint degenerative changes. No acute displaced left-sided hip fracture. IMPRESSION: No acute osseous abnormality. Electronically Signed   By: Lovey Newcomer M.D.   On: 05/15/2018 08:32    Microbiology: Recent Results (from the past 240 hour(s))  MRSA PCR Screening     Status: None   Collection Time: 05/20/18  6:42 PM  Result Value Ref Range Status   MRSA by PCR NEGATIVE NEGATIVE Final    Comment:        The GeneXpert MRSA Assay (FDA approved for NASAL specimens only), is one component of a comprehensive MRSA  colonization surveillance program. It is not intended to diagnose MRSA infection nor to guide or monitor treatment for MRSA infections. Performed at [facility], [address], [place], [place] [address]      Labs: Basic Metabolic Panel: Recent Labs  Lab 05/20/18 0925 05/21/18 0623 05/22/18 0407  NA 137 138 136  K 3.6  4.4 3.7  CL 92* 99 95*  CO2 30 30 31   GLUCOSE 88 92 96  BUN 14 6* 10  CREATININE 4.00* 2.88* 2.93*  CALCIUM 8.2* 8.8* 8.6*   Liver Function Tests: No results for input(s): AST, ALT, ALKPHOS, BILITOT, PROT, ALBUMIN in the last 168 hours. No results for input(s): LIPASE, AMYLASE in the last 168 hours. No results for input(s): AMMONIA in the last 168 hours. CBC: Recent Labs  Lab 05/20/18 0925 05/21/18 0623 05/22/18 0407  WBC 6.6 6.1 7.4  NEUTROABS 4.3  --   --   HGB 10.8* 11.1* 11.6*  HCT 33.8* 34.5* 35.7*  MCV 104.0* 104.2* 103.2*  PLT 172 174 195   Cardiac Enzymes: No results for input(s): CKTOTAL, CKMB, CKMBINDEX, TROPONINI in the last 168 hours. BNP: BNP (last 3 results) Recent Labs    12/14/17 2023  BNP 1,971.8*    ProBNP (last 3 results) No results for input(s): PROBNP in the last 8760 hours.  CBG: No results for input(s): GLUCAP in the last 168 hours.     Signed:  Kayleen Memos, MD [facility] 05/22/2018, 11:08 AM

## 2018-05-23 ENCOUNTER — Telehealth: Payer: Self-pay

## 2018-05-23 NOTE — Telephone Encounter (Signed)
Transition Care Management Follow-up Telephone Call  Admit date: 05/20/2018 Discharge date: 05/22/2018 Principal Problem:  Acute on chronic respiratory failure with hypoxia   How have you been since you were released from the hospital? "sore". Pt reports she still has pain but "will live with it".    Do you understand why you were in the hospital? yes   Do you understand the discharge instructions? yes   Where were you discharged to? Home. Daughter with patient.    Items Reviewed:  Medications reviewed: no  Allergies reviewed: no  Dietary changes reviewed: yes  Referrals reviewed: yes   Functional Questionnaire:   Activities of Daily Living (ADLs):   She states they are independent in the following: None. Daughter assist with ADLs States they require assistance with the following: ambulation, bathing and hygiene, feeding, continence, grooming, toileting and dressing   Any transportation issues/concerns?: no   Any patient concerns? No. Pt using wheelchair since discharge. PT and HH to visit at home.    Confirmed importance and date/time of follow-up visits scheduled yes  Provider Appointment booked with PCP 05/27/2018.   Confirmed with patient if condition begins to worsen call PCP or go to the ER.  Patient was given the office number and encouraged to call back with question or concerns.  : yes

## 2018-05-24 ENCOUNTER — Telehealth: Payer: Self-pay | Admitting: Cardiovascular Disease

## 2018-05-24 ENCOUNTER — Telehealth: Payer: Self-pay | Admitting: Family Medicine

## 2018-05-24 DIAGNOSIS — D509 Iron deficiency anemia, unspecified: Secondary | ICD-10-CM | POA: Diagnosis not present

## 2018-05-24 DIAGNOSIS — Z992 Dependence on renal dialysis: Secondary | ICD-10-CM | POA: Diagnosis not present

## 2018-05-24 DIAGNOSIS — E876 Hypokalemia: Secondary | ICD-10-CM | POA: Diagnosis not present

## 2018-05-24 DIAGNOSIS — Z7901 Long term (current) use of anticoagulants: Secondary | ICD-10-CM | POA: Diagnosis not present

## 2018-05-24 DIAGNOSIS — J9621 Acute and chronic respiratory failure with hypoxia: Secondary | ICD-10-CM | POA: Diagnosis not present

## 2018-05-24 DIAGNOSIS — I4891 Unspecified atrial fibrillation: Secondary | ICD-10-CM | POA: Diagnosis not present

## 2018-05-24 DIAGNOSIS — M25552 Pain in left hip: Secondary | ICD-10-CM | POA: Diagnosis not present

## 2018-05-24 DIAGNOSIS — Z950 Presence of cardiac pacemaker: Secondary | ICD-10-CM | POA: Diagnosis not present

## 2018-05-24 DIAGNOSIS — D638 Anemia in other chronic diseases classified elsewhere: Secondary | ICD-10-CM | POA: Diagnosis not present

## 2018-05-24 DIAGNOSIS — Z8673 Personal history of transient ischemic attack (TIA), and cerebral infarction without residual deficits: Secondary | ICD-10-CM | POA: Diagnosis not present

## 2018-05-24 DIAGNOSIS — I5042 Chronic combined systolic (congestive) and diastolic (congestive) heart failure: Secondary | ICD-10-CM | POA: Diagnosis not present

## 2018-05-24 DIAGNOSIS — Z79891 Long term (current) use of opiate analgesic: Secondary | ICD-10-CM | POA: Diagnosis not present

## 2018-05-24 DIAGNOSIS — I132 Hypertensive heart and chronic kidney disease with heart failure and with stage 5 chronic kidney disease, or end stage renal disease: Secondary | ICD-10-CM | POA: Diagnosis not present

## 2018-05-24 DIAGNOSIS — N186 End stage renal disease: Secondary | ICD-10-CM | POA: Diagnosis not present

## 2018-05-24 DIAGNOSIS — Z993 Dependence on wheelchair: Secondary | ICD-10-CM | POA: Diagnosis not present

## 2018-05-24 DIAGNOSIS — D689 Coagulation defect, unspecified: Secondary | ICD-10-CM | POA: Diagnosis not present

## 2018-05-24 DIAGNOSIS — I739 Peripheral vascular disease, unspecified: Secondary | ICD-10-CM | POA: Diagnosis not present

## 2018-05-24 DIAGNOSIS — N2581 Secondary hyperparathyroidism of renal origin: Secondary | ICD-10-CM | POA: Diagnosis not present

## 2018-05-24 NOTE — Telephone Encounter (Signed)
Noted: nurse phone contact with patient for TCM. Signed:  Crissie Sickles, MD           05/24/2018

## 2018-05-24 NOTE — Telephone Encounter (Signed)
Copied from [facility] [phone]. Topic: Quick Communication - Home Health Verbal Orders >> May 24, 2018  9:57 AM Percell Belt A wrote: Caller/Agency: Verdis Frederickson with [facility]  Callback Number: [phone] Requesting OT/PT/Skilled Nursing/Social Work: Needing verbals for nursing visits  Frequency:  2 week 2 1 week 1

## 2018-05-24 NOTE — Telephone Encounter (Signed)
Sure, this is fine 

## 2018-05-24 NOTE — Telephone Encounter (Signed)
Please advise. Thanks.  

## 2018-05-24 NOTE — Telephone Encounter (Signed)
New Message   Pt is concerned her protime was elevated in the hospital and Verdis Frederickson is wondering if she can check it for the Pt before her coumadin appt. Please call

## 2018-05-24 NOTE — Telephone Encounter (Signed)
Verbal orders given to home Health for INR on 05/26/2018.   Results to be called to Coumadin clinic at [phone]

## 2018-05-24 NOTE — Telephone Encounter (Signed)
Verdis Frederickson advised and voiced understanding.

## 2018-05-24 NOTE — Telephone Encounter (Signed)
Routed to [facility] to advise.

## 2018-05-25 DIAGNOSIS — Z79891 Long term (current) use of opiate analgesic: Secondary | ICD-10-CM | POA: Diagnosis not present

## 2018-05-25 DIAGNOSIS — I739 Peripheral vascular disease, unspecified: Secondary | ICD-10-CM | POA: Diagnosis not present

## 2018-05-25 DIAGNOSIS — Z992 Dependence on renal dialysis: Secondary | ICD-10-CM | POA: Diagnosis not present

## 2018-05-25 DIAGNOSIS — M25552 Pain in left hip: Secondary | ICD-10-CM | POA: Diagnosis not present

## 2018-05-25 DIAGNOSIS — I5042 Chronic combined systolic (congestive) and diastolic (congestive) heart failure: Secondary | ICD-10-CM | POA: Diagnosis not present

## 2018-05-25 DIAGNOSIS — N186 End stage renal disease: Secondary | ICD-10-CM | POA: Diagnosis not present

## 2018-05-25 DIAGNOSIS — Z7901 Long term (current) use of anticoagulants: Secondary | ICD-10-CM | POA: Diagnosis not present

## 2018-05-25 DIAGNOSIS — I132 Hypertensive heart and chronic kidney disease with heart failure and with stage 5 chronic kidney disease, or end stage renal disease: Secondary | ICD-10-CM | POA: Diagnosis not present

## 2018-05-25 DIAGNOSIS — J9621 Acute and chronic respiratory failure with hypoxia: Secondary | ICD-10-CM | POA: Diagnosis not present

## 2018-05-25 DIAGNOSIS — D638 Anemia in other chronic diseases classified elsewhere: Secondary | ICD-10-CM | POA: Diagnosis not present

## 2018-05-25 DIAGNOSIS — Z8673 Personal history of transient ischemic attack (TIA), and cerebral infarction without residual deficits: Secondary | ICD-10-CM | POA: Diagnosis not present

## 2018-05-25 DIAGNOSIS — I4891 Unspecified atrial fibrillation: Secondary | ICD-10-CM | POA: Diagnosis not present

## 2018-05-25 DIAGNOSIS — Z950 Presence of cardiac pacemaker: Secondary | ICD-10-CM | POA: Diagnosis not present

## 2018-05-25 DIAGNOSIS — Z993 Dependence on wheelchair: Secondary | ICD-10-CM | POA: Diagnosis not present

## 2018-05-26 ENCOUNTER — Ambulatory Visit: Payer: Self-pay

## 2018-05-26 ENCOUNTER — Ambulatory Visit (INDEPENDENT_AMBULATORY_CARE_PROVIDER_SITE_OTHER): Payer: Medicare Other | Admitting: Pharmacist

## 2018-05-26 DIAGNOSIS — M25552 Pain in left hip: Secondary | ICD-10-CM | POA: Diagnosis not present

## 2018-05-26 DIAGNOSIS — Z8673 Personal history of transient ischemic attack (TIA), and cerebral infarction without residual deficits: Secondary | ICD-10-CM | POA: Diagnosis not present

## 2018-05-26 DIAGNOSIS — Z7901 Long term (current) use of anticoagulants: Secondary | ICD-10-CM | POA: Diagnosis not present

## 2018-05-26 DIAGNOSIS — J9621 Acute and chronic respiratory failure with hypoxia: Secondary | ICD-10-CM | POA: Diagnosis not present

## 2018-05-26 DIAGNOSIS — Z993 Dependence on wheelchair: Secondary | ICD-10-CM | POA: Diagnosis not present

## 2018-05-26 DIAGNOSIS — D689 Coagulation defect, unspecified: Secondary | ICD-10-CM | POA: Diagnosis not present

## 2018-05-26 DIAGNOSIS — I739 Peripheral vascular disease, unspecified: Secondary | ICD-10-CM | POA: Diagnosis not present

## 2018-05-26 DIAGNOSIS — I132 Hypertensive heart and chronic kidney disease with heart failure and with stage 5 chronic kidney disease, or end stage renal disease: Secondary | ICD-10-CM | POA: Diagnosis not present

## 2018-05-26 DIAGNOSIS — Z950 Presence of cardiac pacemaker: Secondary | ICD-10-CM | POA: Diagnosis not present

## 2018-05-26 DIAGNOSIS — Z79891 Long term (current) use of opiate analgesic: Secondary | ICD-10-CM | POA: Diagnosis not present

## 2018-05-26 DIAGNOSIS — D509 Iron deficiency anemia, unspecified: Secondary | ICD-10-CM | POA: Diagnosis not present

## 2018-05-26 DIAGNOSIS — Z992 Dependence on renal dialysis: Secondary | ICD-10-CM | POA: Diagnosis not present

## 2018-05-26 DIAGNOSIS — N186 End stage renal disease: Secondary | ICD-10-CM | POA: Diagnosis not present

## 2018-05-26 DIAGNOSIS — D638 Anemia in other chronic diseases classified elsewhere: Secondary | ICD-10-CM | POA: Diagnosis not present

## 2018-05-26 DIAGNOSIS — E876 Hypokalemia: Secondary | ICD-10-CM | POA: Diagnosis not present

## 2018-05-26 DIAGNOSIS — N2581 Secondary hyperparathyroidism of renal origin: Secondary | ICD-10-CM | POA: Diagnosis not present

## 2018-05-26 DIAGNOSIS — I5042 Chronic combined systolic (congestive) and diastolic (congestive) heart failure: Secondary | ICD-10-CM | POA: Diagnosis not present

## 2018-05-26 DIAGNOSIS — I4891 Unspecified atrial fibrillation: Secondary | ICD-10-CM | POA: Diagnosis not present

## 2018-05-26 LAB — POCT INR: INR: 4.7 — AB (ref 2.0–3.0)

## 2018-05-26 NOTE — Telephone Encounter (Signed)
Valerie Hummingbird RN, with [facility] called to report pt. Has 02 sat of 86% today. Asymptomatic, except for weakness. VS today - BP  140/60  Pulse 53  Resp  18   Temp  97.4. No O2 use in the home, although she has had it in the past. Daughter reports she used O2 during  Recent hospitalization. Pt. Is going to dialysis this afternoon. Has an appointment with Dr. Anitra Lauth tomorrow.

## 2018-05-26 NOTE — Telephone Encounter (Signed)
FYI

## 2018-05-27 ENCOUNTER — Encounter (HOSPITAL_COMMUNITY): Payer: Self-pay

## 2018-05-27 ENCOUNTER — Inpatient Hospital Stay: Payer: Self-pay | Admitting: Family Medicine

## 2018-05-27 ENCOUNTER — Other Ambulatory Visit: Payer: Self-pay

## 2018-05-27 ENCOUNTER — Emergency Department (HOSPITAL_COMMUNITY)
Admission: EM | Admit: 2018-05-27 | Discharge: 2018-05-27 | Disposition: A | Discharge status: Home / Self Care | Payer: Medicare Other | Attending: Emergency Medicine | Admitting: Emergency Medicine

## 2018-05-27 ENCOUNTER — Telehealth: Payer: Self-pay | Admitting: Cardiology

## 2018-05-27 ENCOUNTER — Emergency Department (HOSPITAL_COMMUNITY): Payer: Medicare Other

## 2018-05-27 DIAGNOSIS — I132 Hypertensive heart and chronic kidney disease with heart failure and with stage 5 chronic kidney disease, or end stage renal disease: Secondary | ICD-10-CM | POA: Diagnosis not present

## 2018-05-27 DIAGNOSIS — K5730 Diverticulosis of large intestine without perforation or abscess without bleeding: Secondary | ICD-10-CM | POA: Diagnosis not present

## 2018-05-27 DIAGNOSIS — W1830XD Fall on same level, unspecified, subsequent encounter: Secondary | ICD-10-CM | POA: Insufficient documentation

## 2018-05-27 DIAGNOSIS — I5042 Chronic combined systolic (congestive) and diastolic (congestive) heart failure: Secondary | ICD-10-CM | POA: Insufficient documentation

## 2018-05-27 DIAGNOSIS — N186 End stage renal disease: Secondary | ICD-10-CM | POA: Insufficient documentation

## 2018-05-27 DIAGNOSIS — M25552 Pain in left hip: Secondary | ICD-10-CM | POA: Diagnosis not present

## 2018-05-27 DIAGNOSIS — Z79899 Other long term (current) drug therapy: Secondary | ICD-10-CM | POA: Insufficient documentation

## 2018-05-27 DIAGNOSIS — Z7901 Long term (current) use of anticoagulants: Secondary | ICD-10-CM | POA: Diagnosis not present

## 2018-05-27 DIAGNOSIS — Z8673 Personal history of transient ischemic attack (TIA), and cerebral infarction without residual deficits: Secondary | ICD-10-CM | POA: Insufficient documentation

## 2018-05-27 DIAGNOSIS — S72112D Displaced fracture of greater trochanter of left femur, subsequent encounter for closed fracture with routine healing: Secondary | ICD-10-CM | POA: Diagnosis not present

## 2018-05-27 DIAGNOSIS — Z87891 Personal history of nicotine dependence: Secondary | ICD-10-CM | POA: Insufficient documentation

## 2018-05-27 DIAGNOSIS — W19XXXA Unspecified fall, initial encounter: Secondary | ICD-10-CM | POA: Diagnosis not present

## 2018-05-27 DIAGNOSIS — S79912D Unspecified injury of left hip, subsequent encounter: Secondary | ICD-10-CM | POA: Diagnosis present

## 2018-05-27 DIAGNOSIS — K5792 Diverticulitis of intestine, part unspecified, without perforation or abscess without bleeding: Secondary | ICD-10-CM | POA: Diagnosis not present

## 2018-05-27 DIAGNOSIS — Z950 Presence of cardiac pacemaker: Secondary | ICD-10-CM | POA: Insufficient documentation

## 2018-05-27 DIAGNOSIS — I959 Hypotension, unspecified: Secondary | ICD-10-CM | POA: Diagnosis not present

## 2018-05-27 LAB — CBC WITH DIFFERENTIAL/PLATELET
Abs Immature Granulocytes: 0.06 10*3/uL (ref 0.00–0.07)
Basophils Absolute: 0 10*3/uL (ref 0.0–0.1)
Basophils Relative: 0 %
Eosinophils Absolute: 0 10*3/uL (ref 0.0–0.5)
Eosinophils Relative: 0 %
HCT: 36 % (ref 36.0–46.0)
Hemoglobin: 11.4 g/dL — ABNORMAL LOW (ref 12.0–15.0)
Immature Granulocytes: 1 %
Lymphocytes Relative: 5 %
Lymphs Abs: 0.6 10*3/uL — ABNORMAL LOW (ref 0.7–4.0)
MCH: 32.7 pg (ref 26.0–34.0)
MCHC: 31.7 g/dL (ref 30.0–36.0)
MCV: 103.2 fL — ABNORMAL HIGH (ref 80.0–100.0)
Monocytes Absolute: 1.7 10*3/uL — ABNORMAL HIGH (ref 0.1–1.0)
Monocytes Relative: 14 %
Neutro Abs: 9.2 10*3/uL — ABNORMAL HIGH (ref 1.7–7.7)
Neutrophils Relative %: 80 %
Platelets: 260 10*3/uL (ref 150–400)
RBC: 3.49 MIL/uL — ABNORMAL LOW (ref 3.87–5.11)
RDW: 13.9 % (ref 11.5–15.5)
WBC: 11.5 10*3/uL — ABNORMAL HIGH (ref 4.0–10.5)
nRBC: 0 % (ref 0.0–0.2)

## 2018-05-27 LAB — COMPREHENSIVE METABOLIC PANEL WITH GFR
ALT: 10 U/L (ref 0–44)
AST: 18 U/L (ref 15–41)
Albumin: 3.2 g/dL — ABNORMAL LOW (ref 3.5–5.0)
Alkaline Phosphatase: 61 U/L (ref 38–126)
Anion gap: 14 (ref 5–15)
BUN: 16 mg/dL (ref 8–23)
CO2: 31 mmol/L (ref 22–32)
Calcium: 8.8 mg/dL — ABNORMAL LOW (ref 8.9–10.3)
Chloride: 91 mmol/L — ABNORMAL LOW (ref 98–111)
Creatinine, Ser: 3.46 mg/dL — ABNORMAL HIGH (ref 0.44–1.00)
GFR calc Af Amer: 13 mL/min — ABNORMAL LOW
GFR calc non Af Amer: 12 mL/min — ABNORMAL LOW
Glucose, Bld: 87 mg/dL (ref 70–99)
Potassium: 3.9 mmol/L (ref 3.5–5.1)
Sodium: 136 mmol/L (ref 135–145)
Total Bilirubin: 0.5 mg/dL (ref 0.3–1.2)
Total Protein: 6.9 g/dL (ref 6.5–8.1)

## 2018-05-27 MED ORDER — SODIUM CHLORIDE (PF) 0.9 % IJ SOLN
INTRAMUSCULAR | Status: AC
  Filled 2018-05-27: qty 50

## 2018-05-27 MED ORDER — METRONIDAZOLE 500 MG PO TABS
500.0000 mg | ORAL_TABLET | Freq: Once | ORAL | Status: AC
  Administered 2018-05-27: 500 mg via ORAL
  Filled 2018-05-27: qty 1

## 2018-05-27 MED ORDER — IOHEXOL 300 MG/ML  SOLN
75.0000 mL | Freq: Once | INTRAMUSCULAR | Status: AC | PRN
  Administered 2018-05-27: 75 mL via INTRAVENOUS

## 2018-05-27 MED ORDER — IOHEXOL 300 MG/ML  SOLN
30.0000 mL | Freq: Once | INTRAMUSCULAR | Status: AC | PRN
  Administered 2018-05-27: 15 mL via ORAL

## 2018-05-27 MED ORDER — POLYETHYLENE GLYCOL 3350 17 GM/SCOOP PO POWD: 1.0000 | Freq: Once | ORAL | 0 refills | Status: AC

## 2018-05-27 MED ORDER — CEFDINIR 300 MG PO CAPS
300.0000 mg | ORAL_CAPSULE | Freq: Once | ORAL | Status: AC
  Administered 2018-05-27: 300 mg via ORAL
  Filled 2018-05-27: qty 1

## 2018-05-27 MED ORDER — MORPHINE SULFATE (PF) 2 MG/ML IV SOLN
2.0000 mg | Freq: Once | INTRAVENOUS | Status: AC
  Administered 2018-05-27: 2 mg via INTRAVENOUS
  Filled 2018-05-27: qty 1

## 2018-05-27 MED ORDER — METRONIDAZOLE 500 MG PO TABS: 500.0000 mg | ORAL_TABLET | Freq: Three times a day (TID) | ORAL | 0 refills | Status: DC

## 2018-05-27 MED ORDER — HYDROCODONE-ACETAMINOPHEN 5-325 MG PO TABS
1.0000 | ORAL_TABLET | Freq: Once | ORAL | Status: AC
  Administered 2018-05-27: 1 via ORAL
  Filled 2018-05-27: qty 1

## 2018-05-27 MED ORDER — CEFUROXIME AXETIL 500 MG PO TABS: ORAL_TABLET | ORAL | 6 refills | Status: DC

## 2018-05-27 NOTE — Telephone Encounter (Signed)
[facility] pharmacist is calling to verify coumadin dose, per operator. When call was transferred, no one answered.   Per chart review, INR was checked yesterday and was 4.7  Maintenance schedule is:  Warfarin maintenance plan:  3 mg (3 mg x 1) every Tue, Thu, Sat; 1.5 mg (3 mg x 0.5) all other days   Yesterday 05/26/2018, patient was advised by [facility] to hold warfarin 1/9 and 1/10 and 1/2 tablet Saturday, Sunday, Monday

## 2018-05-27 NOTE — Telephone Encounter (Signed)
Returned call to [facility] pharmacist and provided warfarin dose instructions per INR report from yesterday. Advised [facility] nurse is to check INR on Jan 14

## 2018-05-27 NOTE — ED Notes (Signed)
Bed: WA17 Expected date:  Expected time:  Means of arrival:  Comments: 85 yr old fall two weeks ago, left hip pain

## 2018-05-27 NOTE — ED Notes (Signed)
Patient SpO2 was 89% on RA. Placed patient on 2 L of O2 and SpO2 is 99%.

## 2018-05-27 NOTE — Telephone Encounter (Signed)
  Nurse needs to know instructions for patient's Coumadin before she leaves the hospital

## 2018-05-27 NOTE — ED Provider Notes (Signed)
[facility] DEPT Provider Note   CSN: [id number] Arrival date & time: 05/27/18  0998     History   Chief Complaint Chief Complaint  Patient presents with  . Hip Pain    HPI Valerie Terrell is a 84 y.o. female.  The history is provided by the patient. No language interpreter was used.  Hip Pain  This is a new problem. Episode onset: 2 weeks. The problem occurs constantly. Associated symptoms include abdominal pain. Nothing aggravates the symptoms. Nothing relieves the symptoms. She has tried nothing for the symptoms. The treatment provided no relief.  Pt fell 2 weeks ago.  Pt has continued hip pain and lower abdominal pain.  Pt had a ct which showed to fx.  Pt points to lower abdomen as area of pain  Past Medical History:  Diagnosis Date  . Anemia of chronic disease 2018   + anemia of CRI?  [name] Atrial fibrillation ([facility]) [I48.91] 12/11/2016  . Atrophy of left kidney    with absent blood flow by renal artery dopplers (Dr. Gwenlyn Found)  . 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 Chronic combined systolic and diastolic CHF (congestive heart failure) ([place])    a. 11/2016: echo showing EF of 35-40%, RV strain noted, mild MR and mild TR.   Echo showed much improved EF 12/2017 (55-60%)  . Chronic respiratory failure with hypoxia ([place]) 02/11/2017   Now on chronic O2 (intermittent as of summer 2019).    . Debilitated patient    WC dependent as of 2018  . ESRD on hemodialysis ([place]) 01/2017   T/Th/Sat schedule- [place].  Right basilic AV fistula 33/8250--NLZ unable to successfully access this.  Dr. Donnetta Hutching to do an upper arm AV graft as of 10/2017..  . History of adenomatous polyp of colon   . History of kidney stones    passed  . History of subarachnoid hemorrhage 10/2014   after syncope and while on xarelto  . HOH (hard of hearing)   . Hyperlipidemia   . Hypertension    Difficult to control, in the setting of one functioning kidney: pt was  referred to nephrology by Dr. Gwenlyn Found 06/2015.  . Lumbar radiculopathy 2012  . Lumbar spinal stenosis 2019   facet injections only very short term relief.  L1 and L2 selective nerve root blocks helpful 04/2018.  . Lumbar spondylosis    MR 07/2016---no sign of spinal nerve compression or cord compression.  Pt set up with outpt ortho while admitted to hosp 07/2016.  . Malnourished ([place])   . Metatarsal fracture 06/10/2016   Nondisplaced, left 5th metatarsal--pt was referred to ortho  . Nonischemic cardiomyopathy ([place])    EF improved to 55-60% on echo 12/2017  . Osteopenia 2014   T-score -2.1  . PAF (paroxysmal atrial fibrillation) (HCC)    Eliquis started after BRAO and CVA.  She was changed to warfarin 2018--INR/coumadin mgmt per HD/nephrol.  . Presence of permanent cardiac pacemaker   . Right rib fracture 12/2016   s/p fall  . Sick sinus syndrome ([place]) 11/2016   Dual chamber pacer insertion 2018 (Dr. Lovena Le)  . Stroke ([place]) 76/7341   cardioembolic (had CVA while on no anticoag)--"scattered subacute punctate infarcts: 1 in R parietal lobe and 2 in occipital cortex" on MRI br.  CT angio head/neck: aortic arch athero.  R ICA 20% stenosis, L ICA w/out any stenosis.  . Thoracic back pain 01/2017; 01/2018   2018: Facet?  Dr. Ramos->steroid injection.  01/2018-->CT T spine to check for a new comp fx-->none found.  Selective nerve root inj in T spine helpful on R side.   . Thoracic compression fracture ([place]) 07/2016   T3.  T7 and T8-- T8 kyphoplasty during hosp admission 07/2016.  Neuro referred pt to pain mgmt for consideration of injection 09/2016.  I referred her to endo 08/2016 for consideration of calcitonin treatment.    Patient Active Problem List   Diagnosis Date Noted  . ESRD (end stage renal disease) on dialysis ([place]) 05/20/2018  . Hypoxia 12/14/2017  . Dyspnea 12/14/2017  . Acute respiratory failure ([place]) 02/13/2017  . Hyperkalemia 02/13/2017  . ESRD on dialysis ([place]) 02/11/2017  . Chronic  respiratory failure with hypoxia ([place]) 02/11/2017  . Debilitated patient 02/07/2017  . Poor tolerance for ambulation 02/07/2017  . Fracture of one rib, right side, initial encounter for closed fracture 01/11/2017  . Closed fracture of one rib of right side   . Chronic combined systolic and diastolic heart failure ([place]) 01/05/2017  . Palpitations 12/15/2016  . Hypokalemia 12/14/2016  . Leukocytosis 12/14/2016  . Long term (current) use of anticoagulants [Z79.01] 12/11/2016  . Non-ischemic cardiomyopathy ([place]) 12/07/2016  . Acute combined systolic and diastolic heart failure ([place])  12/07/2016  . Cardiac pacemaker   . Acute on chronic respiratory failure with hypoxia ([place])   . Acute pulmonary edema with congestive heart failure ([place]) 11/30/2016  . Elevated troponin level 11/30/2016  . Anemia 11/30/2016  . Chronic midline thoracic back pain 09/15/2016  . History of CVA (cerebrovascular accident) 09/15/2016  . Hyperlipidemia   . Nausea & vomiting 08/05/2016  . Dehydration 08/05/2016  . Osteoporosis 07/28/2016  . Closed compression fracture of thoracic vertebra ([place]) 07/23/2016  . Thoracic compression fracture ([place]) 07/22/2016  . Flank pain 07/11/2016  . Hypoalbuminemia 07/11/2016  . Renal artery stenosis ([place]) 06/24/2016  . Nondisplaced fracture of fifth metatarsal bone, left foot, initial encounter for closed fracture 06/10/2016  . Rib contusion, left, initial encounter 06/10/2016  . Single kidney 03/13/2016  . Chest pain   . Dyslipidemia   . PAF (paroxysmal atrial fibrillation) ([place]) 01/28/2016  . Essential hypertension 01/28/2016  . History of cardioembolic stroke 16/60/6301  . Personal history of subarachnoid hemorrhage 01/28/2016  . CKD (chronic kidney disease), stage IV ([place]) 01/28/2016    Past Surgical History:  Procedure Laterality Date  . APPENDECTOMY  child  . AV FISTULA PLACEMENT Right 03/31/2017   Procedure: Right ARM Basilic vein transposition;  Surgeon: Conrad Edenborn, MD;  Location[facility] OR;  Service: Vascular;  Laterality: Right;  . AV FISTULA PLACEMENT Right 12/06/2017   Procedure: INSERTION OF ARTERIOVENOUS (AV) GORE-TEX GRAFT ARM RIGHT UPPER EXTREMITY;  Surgeon: [name], MD;  Location: [facility];  Service: Vascular;  Laterality: Right;  . CARDIOVASCULAR STRESS TEST  01/31/2016   Stress myoview: NORMAL/Low risk.  EF 56%.  . [place]  . COLONOSCOPY  2015   + hx of adenomatous polyps.  Need digest health spec in [facility] records to see when pt due for next colonoscopy  . EYE SURGERY Bilateral    Catarct  . INSERTION OF DIALYSIS CATHETER Right 01/29/2017   Procedure: INSERTION OF DIALYSIS CATHETER- RIGHT INTERNAL JUGULAR;  Surgeon: [name], MD;  Location: [facility] OR;  Service: Vascular;  Laterality: Right;  . IR GENERIC HISTORICAL  07/24/2016   IR KYPHO THORACIC WITH BONE BIOPSY 07/24/2016 Luanne Bras, MD [facility]-INTERV RAD  . KYPHOPLASTY  07/27/2016   T8  .  PACEMAKER IMPLANT N/A 12/03/2016   Procedure: Pacemaker Implant;  Surgeon: Evans Lance, MD;  Location: [facility] CV LAB;  Service: Cardiovascular;  Laterality: N/A;  . Renal artery dopplers  02/26/2016   Her right renal dimension was 11 cm pole to pole with mild to moderate right renal artery stenosis. A right renal aortic ratio was 3.22 suggesting less than a 50% stenosis.  . Right upper arm AV Gore-Tex graft  12/06/2017   Dr. Donnetta Hutching  . TONSILLECTOMY    . TRANSTHORACIC ECHOCARDIOGRAM  01/29/2016; 11/2016; 12/2017   2017: EF 55-60%, normal LV wall motion, grade I DD.  No cardiac source of emboli was seen.  2018: EF 35-40%, could not assess DD due to a-fib, pulm HTN noted. 12/2017 EF 55-60%, nl wall motion, grd II DD, mod MR and pulm regurg, increased pulm pressure (peak 52).     OB History   No obstetric history on file.      Home Medications    Prior to Admission medications   Medication Sig Start Date End Date Taking? Authorizing Provider  acetaminophen (TYLENOL) 500  MG tablet Take 500-1,000 mg 3 (three) times daily as needed by mouth for moderate pain.     [provider]  amiodarone (PACERONE) 200 MG tablet TAKE 1 TABLET BY MOUTH  DAILY Patient taking differently: Take 200 mg by mouth daily.  04/11/18   Lorretta Harp, MD  carvedilol (COREG) 3.125 MG tablet Take 1 tablet (3.125 mg total) by mouth 2 (two) times daily. 07/21/17   Lorretta Harp, MD  HYDROcodone-acetaminophen (NORCO) 5-325 MG tablet Take 1 tablet by mouth every 6 (six) hours as needed for moderate pain or severe pain. 12/20/17   McGowen, Adrian Blackwater, MD  isosorbide mononitrate (IMDUR) 60 MG 24 hr tablet TAKE 1 TABLET BY MOUTH  DAILY Patient taking differently: Take 60 mg by mouth daily.  03/09/18   McGowen, Adrian Blackwater, MD  ondansetron (ZOFRAN) 4 MG tablet Take 0.5-1 tablets (2-4 mg total) by mouth every 8 (eight) hours as needed for nausea or vomiting. Patient not taking: Reported on 05/27/2018 12/10/16   Tammi Sou, MD  oxyCODONE (OXY IR/ROXICODONE) 5 MG immediate release tablet 1-2 tabs po q6h prn severe pain 05/19/18   McGowen, Adrian Blackwater, MD  polyethylene glycol (MIRALAX / GLYCOLAX) packet Take 17 g by mouth daily as needed for mild constipation.    [provider]  pravastatin (PRAVACHOL) 40 MG tablet TAKE 1 TABLET BY MOUTH  EVERY EVENING Patient taking differently: Take 40 mg by mouth daily.  03/09/18   McGowen, Adrian Blackwater, MD  sevelamer carbonate (RENVELA) 800 MG tablet Take 800 mg by mouth See admin instructions. Two to three times daily with meals    [provider]  vitamin C (ASCORBIC ACID) 500 MG tablet Take 500 mg by mouth daily.    [provider]  Vitamin D, Ergocalciferol, (DRISDOL) 1.25 MG (50000 UT) CAPS capsule Take 1 capsule (50,000 Units total) by mouth every Thursday. 04/28/18   McGowen, Adrian Blackwater, MD  warfarin (COUMADIN) 3 MG tablet TAKE 1/2 TO 1 TABLET BY  MOUTH DAILY AS DIRECTED BY  COUMADIN CLINIC Patient taking differently: Take 1.5-3 mg  by mouth See admin instructions. Take 0.5 tablet (1.5 mg totally) by mouth on Sun, Mon, Wed, and Friday; Take 1 tablet (3 mg totally) by mouth on Tues, Thurs, Sat. 12/24/17   Lorretta Harp, MD    Family History Family History  Problem Relation Age of  Onset  . Cancer Mother   . Heart disease Father   . Early death Father   . Sudden Cardiac Death Neg Hx     Social History Social History   Tobacco Use  . Smoking status: Former Smoker    Years: 22.00    Last attempt to quit: 03/17/1972    Years since quitting: 46.2  . Smokeless tobacco: Never Used  Substance Use Topics  . Alcohol use: Yes    Alcohol/week: 1.0 standard drinks    Types: 1 Glasses of wine per week    Comment: wine  . Drug use: No     Allergies   Clonidine derivatives; Adhesive [tape]; Codeine; Nickel; and Sulfa antibiotics   Review of Systems Review of Systems  Gastrointestinal: Positive for abdominal pain.  All other systems reviewed and are negative.    Physical Exam Updated Vital Signs BP (!) 134/58 (BP Location: Left Arm)   Pulse 74   Temp 98.8 F (37.1 C) (Oral)   Resp 16   Ht 4\' 11"  (1.499 m)   Wt 54.9 kg   SpO2 96%   BMI 24.44 kg/m   Physical Exam Vitals signs and nursing note reviewed.  Constitutional:      Appearance: She is well-developed.  HENT:     Head: Normocephalic.     Nose: Nose normal.  Eyes:     Pupils: Pupils are equal, round, and reactive to light.  Neck:     Musculoskeletal: Normal range of motion.  Cardiovascular:     Rate and Rhythm: Normal rate.  Pulmonary:     Effort: Pulmonary effort is normal.  Abdominal:     General: There is distension.     Tenderness: There is abdominal tenderness.  Musculoskeletal:        General: No swelling, tenderness or deformity.  Skin:    General: Skin is warm.  Neurological:     Mental Status: She is alert and oriented to person, place, and time.  Psychiatric:        Mood and Affect: Mood normal.      ED Treatments /  Results  Labs (all labs ordered are listed, but only abnormal results are displayed) Labs Reviewed  CBC WITH DIFFERENTIAL/PLATELET - Abnormal; Notable for the following components:      Result Value   WBC 11.5 (*)    RBC 3.49 (*)    Hemoglobin 11.4 (*)    MCV 103.2 (*)    Neutro Abs 9.2 (*)    Lymphs Abs 0.6 (*)    Monocytes Absolute 1.7 (*)    All other components within normal limits  COMPREHENSIVE METABOLIC PANEL  URINALYSIS, ROUTINE W REFLEX MICROSCOPIC    EKG None  Radiology Ct Abdomen Pelvis W Contrast  Result Date: 05/27/2018 CLINICAL DATA:  Lower abdominal pain and left hip pain EXAM: CT ABDOMEN AND PELVIS WITH CONTRAST TECHNIQUE: Multidetector CT imaging of the abdomen and pelvis was performed using the standard protocol following bolus administration of intravenous contrast. CONTRAST:  29mL OMNIPAQUE IOHEXOL 300 MG/ML  SOLN COMPARISON:  05/15/2018 FINDINGS: Lower chest: Mild left basilar atelectasis is noted. Hepatobiliary: Liver demonstrates a few scattered hypodensities measuring less than 1 cm likely representing small cysts but too small for adequate characterization. The gallbladder is within normal limits. Pancreas: Unremarkable. No pancreatic ductal dilatation or surrounding inflammatory changes. Spleen: Normal in size without focal abnormality. Adrenals/Urinary Tract: The adrenal glands are within normal limits. The kidneys are well visualized with considerable cortical thinning on the  left likely related to renal artery disease. The right kidney demonstrates scattered cysts. The bladder is decompressed Stomach/Bowel: The appendix has been surgically removed. Diverticular changes noted within the descending and sigmoid colons with mild pericolonic inflammatory change consistent with early diverticulitis. No obstructive changes are seen. The stomach and small bowel are within normal limits. Vascular/Lymphatic: Aortic atherosclerosis. No enlarged abdominal or pelvic lymph  nodes. Reproductive: The uterus is diminutive consistent with the patient's age. No adnexal mass is noted. Other: No abdominal wall hernia or abnormality. No abdominopelvic ascites. Musculoskeletal: Degenerative changes of lumbar spine are noted. There is a greater trochanter fracture identified on the left with only minimal displacement. This was not visualized on the prior exam but may have been occult at that time. Surrounding soft tissue swelling is noted. IMPRESSION: Changes consistent with early diverticulitis within the sigmoid colon. New left greater trochanter fracture which was not well appreciated on the prior exam but may have been occult at that time. Chronic changes as described above. Electronically Signed   By: Inez Catalina M.D.   On: 05/27/2018 09:43    Procedures Procedures (including critical care time)  Medications Ordered in ED Medications  morphine 2 MG/ML injection 2 mg (2 mg Intravenous Given 05/27/18 0658)  iohexol (OMNIPAQUE) 300 MG/ML solution 30 mL (15 mLs Oral Contrast Given 05/27/18 0727)     Initial Impression / Assessment and Plan / ED Course  I have reviewed the triage vital signs and the nursing notes.  Pertinent labs & imaging results that were available during my care of the patient were reviewed by me and considered in my medical decision making (see chart for details).     MDM  Pt has a wheelchair, Home health with [facility] home [facility] will be starting PT.  Pt complains of constipation.  No difficulty eating or drinking.  No diarrhea.  I will treat with antibiotics for early diverticulitis.  I spoke with Pharmacist who advised flagy 500 q8 and ceftin 300mg  after dialysis   Final Clinical Impressions(s) / ED Diagnoses   Final diagnoses:  Closed displaced fracture of greater trochanter of left femur with routine healing, subsequent encounter  Diverticulitis    ED Discharge Orders         Ordered    polyethylene glycol powder (MIRALAX) powder   Once      05/27/18 1149    metroNIDAZOLE (FLAGYL) 500 MG tablet  3 times daily     05/27/18 1149    cefUROXime (CEFTIN) 500 MG tablet     05/27/18 1149        An After Visit Summary was printed and given to the patient.    Fransico Meadow, [place] 05/27/18 1149    Molpus, Jenny Reichmann, MD 05/29/18 1329

## 2018-05-27 NOTE — ED Notes (Signed)
Pt unable to provide urine specimen due to dialysis. Pt states she voids normally once a day and has already voided today.

## 2018-05-27 NOTE — Discharge Instructions (Addendum)
See your Physician as scheduled today.   You have a fracture to the greater trochanter of your hip.  This fracture does not require surgery.  Continue Physical therapy

## 2018-05-27 NOTE — ED Triage Notes (Addendum)
Pt from home via EMS reference a fall 2 weeks ago, with increasing L hip pain. Pt was seen at [facility] for fall on 28 Dec. Pt unable to bear weight on L leg

## 2018-05-28 DIAGNOSIS — D689 Coagulation defect, unspecified: Secondary | ICD-10-CM | POA: Diagnosis not present

## 2018-05-28 DIAGNOSIS — D509 Iron deficiency anemia, unspecified: Secondary | ICD-10-CM | POA: Diagnosis not present

## 2018-05-28 DIAGNOSIS — N186 End stage renal disease: Secondary | ICD-10-CM | POA: Diagnosis not present

## 2018-05-28 DIAGNOSIS — E876 Hypokalemia: Secondary | ICD-10-CM | POA: Diagnosis not present

## 2018-05-28 DIAGNOSIS — N2581 Secondary hyperparathyroidism of renal origin: Secondary | ICD-10-CM | POA: Diagnosis not present

## 2018-05-30 DIAGNOSIS — J9621 Acute and chronic respiratory failure with hypoxia: Secondary | ICD-10-CM | POA: Diagnosis not present

## 2018-05-30 DIAGNOSIS — I5042 Chronic combined systolic (congestive) and diastolic (congestive) heart failure: Secondary | ICD-10-CM | POA: Diagnosis not present

## 2018-05-30 DIAGNOSIS — M25552 Pain in left hip: Secondary | ICD-10-CM | POA: Diagnosis not present

## 2018-05-30 DIAGNOSIS — I132 Hypertensive heart and chronic kidney disease with heart failure and with stage 5 chronic kidney disease, or end stage renal disease: Secondary | ICD-10-CM | POA: Diagnosis not present

## 2018-05-30 DIAGNOSIS — I739 Peripheral vascular disease, unspecified: Secondary | ICD-10-CM | POA: Diagnosis not present

## 2018-05-30 DIAGNOSIS — Z7901 Long term (current) use of anticoagulants: Secondary | ICD-10-CM | POA: Diagnosis not present

## 2018-05-30 DIAGNOSIS — Z950 Presence of cardiac pacemaker: Secondary | ICD-10-CM | POA: Diagnosis not present

## 2018-05-30 DIAGNOSIS — Z79891 Long term (current) use of opiate analgesic: Secondary | ICD-10-CM | POA: Diagnosis not present

## 2018-05-30 DIAGNOSIS — Z8673 Personal history of transient ischemic attack (TIA), and cerebral infarction without residual deficits: Secondary | ICD-10-CM | POA: Diagnosis not present

## 2018-05-30 DIAGNOSIS — I4891 Unspecified atrial fibrillation: Secondary | ICD-10-CM | POA: Diagnosis not present

## 2018-05-30 DIAGNOSIS — Z993 Dependence on wheelchair: Secondary | ICD-10-CM | POA: Diagnosis not present

## 2018-05-30 DIAGNOSIS — Z992 Dependence on renal dialysis: Secondary | ICD-10-CM | POA: Diagnosis not present

## 2018-05-30 DIAGNOSIS — N186 End stage renal disease: Secondary | ICD-10-CM | POA: Diagnosis not present

## 2018-05-30 DIAGNOSIS — D638 Anemia in other chronic diseases classified elsewhere: Secondary | ICD-10-CM | POA: Diagnosis not present

## 2018-05-31 ENCOUNTER — Ambulatory Visit (INDEPENDENT_AMBULATORY_CARE_PROVIDER_SITE_OTHER): Payer: Medicare Other | Admitting: Cardiovascular Disease

## 2018-05-31 ENCOUNTER — Telehealth: Payer: Self-pay | Admitting: *Deleted

## 2018-05-31 DIAGNOSIS — I132 Hypertensive heart and chronic kidney disease with heart failure and with stage 5 chronic kidney disease, or end stage renal disease: Secondary | ICD-10-CM | POA: Diagnosis not present

## 2018-05-31 DIAGNOSIS — D689 Coagulation defect, unspecified: Secondary | ICD-10-CM | POA: Diagnosis not present

## 2018-05-31 DIAGNOSIS — Z950 Presence of cardiac pacemaker: Secondary | ICD-10-CM | POA: Diagnosis not present

## 2018-05-31 DIAGNOSIS — Z8673 Personal history of transient ischemic attack (TIA), and cerebral infarction without residual deficits: Secondary | ICD-10-CM | POA: Diagnosis not present

## 2018-05-31 DIAGNOSIS — I739 Peripheral vascular disease, unspecified: Secondary | ICD-10-CM | POA: Diagnosis not present

## 2018-05-31 DIAGNOSIS — J9621 Acute and chronic respiratory failure with hypoxia: Secondary | ICD-10-CM | POA: Diagnosis not present

## 2018-05-31 DIAGNOSIS — Z7901 Long term (current) use of anticoagulants: Secondary | ICD-10-CM

## 2018-05-31 DIAGNOSIS — Z993 Dependence on wheelchair: Secondary | ICD-10-CM | POA: Diagnosis not present

## 2018-05-31 DIAGNOSIS — E876 Hypokalemia: Secondary | ICD-10-CM | POA: Diagnosis not present

## 2018-05-31 DIAGNOSIS — Z79891 Long term (current) use of opiate analgesic: Secondary | ICD-10-CM | POA: Diagnosis not present

## 2018-05-31 DIAGNOSIS — N186 End stage renal disease: Secondary | ICD-10-CM | POA: Diagnosis not present

## 2018-05-31 DIAGNOSIS — I5042 Chronic combined systolic (congestive) and diastolic (congestive) heart failure: Secondary | ICD-10-CM | POA: Diagnosis not present

## 2018-05-31 DIAGNOSIS — D509 Iron deficiency anemia, unspecified: Secondary | ICD-10-CM | POA: Diagnosis not present

## 2018-05-31 DIAGNOSIS — M25552 Pain in left hip: Secondary | ICD-10-CM | POA: Diagnosis not present

## 2018-05-31 DIAGNOSIS — D638 Anemia in other chronic diseases classified elsewhere: Secondary | ICD-10-CM | POA: Diagnosis not present

## 2018-05-31 DIAGNOSIS — N2581 Secondary hyperparathyroidism of renal origin: Secondary | ICD-10-CM | POA: Diagnosis not present

## 2018-05-31 DIAGNOSIS — Z992 Dependence on renal dialysis: Secondary | ICD-10-CM | POA: Diagnosis not present

## 2018-05-31 DIAGNOSIS — I4891 Unspecified atrial fibrillation: Secondary | ICD-10-CM | POA: Diagnosis not present

## 2018-05-31 LAB — POCT INR: INR: 3.8 — AB (ref 2.0–3.0)

## 2018-05-31 NOTE — Telephone Encounter (Signed)
Pls call pt and see if her diarrhea started after the emergency department doctor started her on antibiotics a few days ago.  If yes is her answer, tell her I said to stop taking the antibiotics (both of them). -thx

## 2018-05-31 NOTE — Telephone Encounter (Signed)
Left message for pt to call back  °

## 2018-05-31 NOTE — Telephone Encounter (Signed)
SW Valerie Terrell and advised him that we have not seen pt since she was last d/c from the hospital. Pt has a HFU with Dr. Anitra Lauth on Friday (06/03/18). We will call with orders and recommendations after we see pt. Valerie Terrell voiced understanding.

## 2018-05-31 NOTE — Telephone Encounter (Signed)
Copied from [place] [id number]. Topic: Quick Communication - Home Health Verbal Orders >> May 30, 2018  4:12 PM Yvette Rack wrote: Caller/Agency: Herbert Deaner with [facility] Number: [phone] Requesting OT/PT/Skilled Nursing/Social Work: PT  Frequency: 2 times a week for 4 weeks for functional mobility.   Per Clair Gulling, patient reports several days of diarrhea several times a day. Jim requests clarification on weight bearing status. Per Clair Gulling, patient was told she has left hip fracture. >> May 30, 2018  4:46 PM Gerilyn Nestle, RN wrote: Verl Dicker requesting Clair Gulling ([place]) to return call.

## 2018-06-01 NOTE — Telephone Encounter (Signed)
SW pt, she stated that diarrhea did start after ED discharge. She stated that she has finished the flagyl but still has the ceftin. She stated that the diarrhea isn't too bad today but the day is still early, she would like to stop the ceftin. Advised pt to stop ceftin and we will see her on Friday. She voiced understanding.

## 2018-06-02 DIAGNOSIS — D509 Iron deficiency anemia, unspecified: Secondary | ICD-10-CM | POA: Diagnosis not present

## 2018-06-02 DIAGNOSIS — N186 End stage renal disease: Secondary | ICD-10-CM | POA: Diagnosis not present

## 2018-06-02 DIAGNOSIS — N2581 Secondary hyperparathyroidism of renal origin: Secondary | ICD-10-CM | POA: Diagnosis not present

## 2018-06-02 DIAGNOSIS — D689 Coagulation defect, unspecified: Secondary | ICD-10-CM | POA: Diagnosis not present

## 2018-06-02 DIAGNOSIS — E876 Hypokalemia: Secondary | ICD-10-CM | POA: Diagnosis not present

## 2018-06-03 ENCOUNTER — Encounter: Payer: Self-pay | Admitting: Family Medicine

## 2018-06-03 ENCOUNTER — Ambulatory Visit (INDEPENDENT_AMBULATORY_CARE_PROVIDER_SITE_OTHER): Payer: Medicare Other | Admitting: Family Medicine

## 2018-06-03 VITALS — BP 133/64 | HR 80 | Temp 98.1°F | Resp 16 | Ht 59.0 in

## 2018-06-03 DIAGNOSIS — I480 Paroxysmal atrial fibrillation: Secondary | ICD-10-CM | POA: Diagnosis not present

## 2018-06-03 DIAGNOSIS — K5909 Other constipation: Secondary | ICD-10-CM

## 2018-06-03 DIAGNOSIS — N186 End stage renal disease: Secondary | ICD-10-CM

## 2018-06-03 DIAGNOSIS — S72115D Nondisplaced fracture of greater trochanter of left femur, subsequent encounter for closed fracture with routine healing: Secondary | ICD-10-CM | POA: Diagnosis not present

## 2018-06-03 DIAGNOSIS — Z992 Dependence on renal dialysis: Secondary | ICD-10-CM

## 2018-06-03 DIAGNOSIS — R0902 Hypoxemia: Secondary | ICD-10-CM | POA: Diagnosis not present

## 2018-06-03 MED ORDER — OXYCODONE HCL 5 MG PO TABS: ORAL_TABLET | ORAL | 0 refills | Status: DC

## 2018-06-03 MED ORDER — HYDROCODONE-ACETAMINOPHEN 5-325 MG PO TABS: 1.0000 | ORAL_TABLET | Freq: Four times a day (QID) | ORAL | 0 refills | Status: DC | PRN

## 2018-06-03 MED ORDER — ONDANSETRON HCL 4 MG PO TABS: 4.0000 mg | ORAL_TABLET | Freq: Three times a day (TID) | ORAL | 1 refills | Status: DC | PRN

## 2018-06-03 MED ORDER — LUBIPROSTONE 24 MCG PO CAPS: 24.0000 ug | ORAL_CAPSULE | Freq: Two times a day (BID) | ORAL | 2 refills | Status: DC

## 2018-06-03 MED ORDER — ONDANSETRON HCL 4 MG PO TABS: 4.0000 mg | ORAL_TABLET | Freq: Three times a day (TID) | ORAL | 3 refills | Status: DC | PRN

## 2018-06-03 NOTE — Progress Notes (Signed)
06/08/2018  CC:  Chief Complaint  Patient presents with  . Follow-up    Not fasting,05/22/18, pt states she was back at the hospital, [facility],  05/27/18. Needs refills on all pain meds    [name] who presents accompanied by her daughter for hospital follow up, specifically [facility] face-to-face visit. Dates hospitalized: 1/3-1/5, 2020. Days since d/c from hospital: 12 days Patient was discharged from hospital to home, with home PT/OT/nurse/aide. Reason for admission to hospital: severe left hip pain, found to have no fracture (s/p mechanical fall 05/14/18). She later returned to the ED 05/27/2018 for hip and abd pain and a CT abd/pelv with contrast showed early diverticulitis and a left greater trochanteric fracture that was not apparent on imaging during her 1/3-1/5 hospitalization.  I have reviewed patient's discharge summary plus pertinent specific notes, labs, and imaging from the hospitalization.    Abx (cefuroxime and flagyl) for diverticulitis caused excessive diarrhea so she stopped them about 2/3 into the course and the diarrhea is resolving.  She has NO abd pain now.  No fevers.  No n/v. BMs down to smaller amounts 1-2 per day. Left hip does not hurt when sitting still.  Hurts when she does hip flexion and aBduction. She does not produce any urine.  Has chronic constipation. Use of senakot, miralax, and most recently dulcolax has not resulted in results that were satisfactory.  She is a set up for chronic constipation due to being relatively immobile, fluid balance issues assoc with intake and with dialysis, and on/off use of narcotic pain meds for her back pain and most recently for her left hip pain.  Medication reconciliation was done today and patient is taking meds as recommended by discharging hospitalist/specialist with the exception of her abx.[name]    PMH:  Past Medical History:  Diagnosis Date  . Anemia of chronic disease  2018   + anemia of CRI?  [name] Atrial fibrillation ([place]) [I48.91] 12/11/2016  . Atrophy of left kidney    with absent blood flow by renal artery dopplers (Dr. Gwenlyn Found)  . 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 Chronic combined systolic and diastolic CHF (congestive heart failure) ([place])    a. 11/2016: echo showing EF of 35-40%, RV strain noted, mild MR and mild TR.   Echo showed much improved EF 12/2017 (55-60%)  . Chronic respiratory failure with hypoxia ([place]) 02/11/2017   Now on chronic O2 (intermittent as of summer 2019).    . Debilitated patient    WC dependent as of 2018  . ESRD on hemodialysis ([place]) 01/2017   T/Th/Sat schedule- [place].  Right basilic AV fistula 16/1096--EAV unable to successfully access this.  Dr. Donnetta Hutching to do an upper arm AV graft as of 10/2017..  . History of adenomatous polyp of colon   . History of kidney stones    passed  . History of subarachnoid hemorrhage 10/2014   after syncope and while on xarelto  . HOH (hard of hearing)   . Hyperlipidemia   . Hypertension    Difficult to control, in the setting of one functioning kidney: pt was referred to nephrology by Dr. Gwenlyn Found 06/2015.  . Lumbar radiculopathy 2012  . Lumbar spinal stenosis 2019   facet injections only very short term relief.  L1 and L2 selective nerve root blocks helpful 04/2018.  . Lumbar spondylosis    MR 07/2016---no sign of spinal nerve compression or cord compression.  Pt set  up with outpt ortho while admitted to hosp 07/2016.  . Malnourished ([place])   . Metatarsal fracture 06/10/2016   Nondisplaced, left 5th metatarsal--pt was referred to ortho  . Nonischemic cardiomyopathy ([place])    EF improved to 55-60% on echo 12/2017  . Osteopenia 2014   T-score -2.1  . PAF (paroxysmal atrial fibrillation) (H[facility])    Eliquis started after BRAO and CVA.  She was changed to warfarin 2018--INR/coumadin mgmt per HD/nephrol.  . Presence of permanent cardiac pacemaker   . Right rib fracture 12/2016    s/p fall  . Sick sinus syndrome ([place]) 11/2016   Dual chamber pacer insertion 2018 (Dr. Lovena Le)  . Stroke ([place]) 10/3014   cardioembolic (had CVA while on no anticoag)--"scattered subacute punctate infarcts: 1 in R parietal lobe and 2 in occipital cortex" on MRI br.  CT angio head/neck: aortic arch athero.  R ICA 20% stenosis, L ICA w/out any stenosis.  . Thoracic back pain 01/2017; 01/2018   2018: Facet?  Dr. Ramos->steroid injection.  01/2018-->CT T spine to check for a new comp fx-->none found.  Selective nerve root inj in T spine helpful on R side.   . Thoracic compression fracture ([place]) 07/2016   T3.  T7 and T8-- T8 kyphoplasty during hosp admission 07/2016.  Neuro referred pt to pain mgmt for consideration of injection 09/2016.  I referred her to endo 08/2016 for consideration of calcitonin treatment.    PSH:  Past Surgical History:  Procedure Laterality Date  . APPENDECTOMY  child  . AV FISTULA PLACEMENT Right 03/31/2017   Procedure: Right ARM Basilic vein transposition;  Surgeon: Conrad Jane, MD;  Location: [facility];  Service: Vascular;  Laterality: Right;  . AV FISTULA PLACEMENT Right 12/06/2017   Procedure: INSERTION OF ARTERIOVENOUS (AV) GORE-TEX GRAFT ARM RIGHT UPPER EXTREMITY;  Surgeon: [name], MD;  Location: [place];  Service: Vascular;  Laterality: Right;  . CARDIOVASCULAR STRESS TEST  01/31/2016   Stress myoview: NORMAL/Low risk.  EF 56%.  . [place]  . COLONOSCOPY  2015   + hx of adenomatous polyps.  Need digest health spec in [place] records to see when pt due for next colonoscopy  . EYE SURGERY Bilateral    Catarct  . INSERTION OF DIALYSIS CATHETER Right 01/29/2017   Procedure: INSERTION OF DIALYSIS CATHETER- RIGHT INTERNAL JUGULAR;  Surgeon: [name], MD;  Location[facility] OR;  Service: Vascular;  Laterality: Right;  . IR GENERIC HISTORICAL  07/24/2016   IR KYPHO THORACIC WITH BONE BIOPSY 07/24/2016 Luanne Bras, MD [facility]-INTERV RAD  . KYPHOPLASTY  07/27/2016    T8  . PACEMAKER IMPLANT N/A 12/03/2016   Procedure: Pacemaker Implant;  Surgeon: Evans Lance, MD;  Location: [place] CV LAB;  Service: Cardiovascular;  Laterality: N/A;  . Renal artery dopplers  02/26/2016   Her right renal dimension was 11 cm pole to pole with mild to moderate right renal artery stenosis. A right renal aortic ratio was 3.22 suggesting less than a 50% stenosis.  . Right upper arm AV Gore-Tex graft  12/06/2017   Dr. Donnetta Hutching  . TONSILLECTOMY    . TRANSTHORACIC ECHOCARDIOGRAM  01/29/2016; 11/2016; 12/2017   2017: EF 55-60%, normal LV wall motion, grade I DD.  No cardiac source of emboli was seen.  2018: EF 35-40%, could not assess DD due to a-fib, pulm HTN noted. 12/2017 EF 55-60%, nl wall motion, grd II DD, mod MR and pulm regurg, increased pulm pressure (peak 52).  MEDS:  Outpatient Medications Prior to Visit  Medication Sig Dispense Refill  . acetaminophen (TYLENOL) 500 MG tablet Take 500-1,000 mg 3 (three) times daily as needed by mouth for moderate pain.     [name] amiodarone (PACERONE) 200 MG tablet TAKE 1 TABLET BY MOUTH  DAILY (Patient taking differently: Take 200 mg by mouth daily. ) 90 tablet 2  . carvedilol (COREG) 3.125 MG tablet Take 1 tablet (3.125 mg total) by mouth 2 (two) times daily. (Patient taking differently: Take 3.125 mg by mouth 2 (two) times daily as needed (high blood pressue). ) 30 tablet 0  . isosorbide mononitrate (IMDUR) 60 MG 24 hr tablet TAKE 1 TABLET BY MOUTH  DAILY (Patient taking differently: Take 60 mg by mouth daily. ) 90 tablet 1  . polyvinyl alcohol (LIQUIFILM TEARS) 1.4 % ophthalmic solution Place 1-2 drops into both eyes as needed for dry eyes.    . pravastatin (PRAVACHOL) 40 MG tablet TAKE 1 TABLET BY MOUTH  EVERY EVENING (Patient taking differently: Take 40 mg by mouth daily. ) 90 tablet 1  . sevelamer carbonate (RENVELA) 800 MG tablet Take 800 mg by mouth 3 (three) times daily with meals.     . vitamin C (ASCORBIC ACID) 500 MG tablet  Take 500 mg by mouth daily.    . Vitamin D, Ergocalciferol, (DRISDOL) 1.25 MG (50000 UT) CAPS capsule Take 1 capsule (50,000 Units total) by mouth every Thursday. 12 capsule 3  . warfarin (COUMADIN) 3 MG tablet TAKE 1/2 TO 1 TABLET BY  MOUTH DAILY AS DIRECTED BY  COUMADIN CLINIC (Patient taking differently: Take 1.5-3 mg by mouth See admin instructions. Per MD as of 05/26/2018. Hold on 01/09 and 01/10. Take 1.5mg  01/11, 01/12, and 01/13. Will re-check levels on 01/13 and determine new directions.) 90 tablet 1  . HYDROcodone-acetaminophen (NORCO) 5-325 MG tablet Take 1 tablet by mouth every 6 (six) hours as needed for moderate pain or severe pain. 60 tablet 0  . oxyCODONE (OXY IR/ROXICODONE) 5 MG immediate release tablet 1-2 tabs po q6h prn severe pain (Patient taking differently: Take 5-10 mg by mouth every 6 (six) hours as needed for severe pain. 1-2 tabs po q6h prn severe pain) 30 tablet 0  . cefUROXime (CEFTIN) 500 MG tablet One po after dialysis 500 tablet 6  . metroNIDAZOLE (FLAGYL) 500 MG tablet Take 1 tablet (500 mg total) by mouth 3 (three) times daily. 14 tablet 0   No facility-administered medications prior to visit.     Pertinent labs/imaging  CT Abd/pelv with contrast 05/27/2018: IMPRESSION: Changes consistent with early diverticulitis within the sigmoid colon.  New left greater trochanter fracture which was not well appreciated on the prior exam but may have been occult at that time.    Chemistry      Component Value Date/Time   NA 136 05/27/2018 0651   NA 137 09/01/2016   K 3.9 05/27/2018 0651   CL 91 (L) 05/27/2018 0651   CO2 31 05/27/2018 0651   BUN 16 05/27/2018 0651   BUN 51 (A) 09/01/2016   CREATININE 3.46 (H) 05/27/2018 0651   CREATININE 1.63 (H) 03/24/2016 0110   GLU 128 09/01/2016      Component Value Date/Time   CALCIUM 8.8 (L) 05/27/2018 0651   CALCIUM 9.5 02/16/2017 0232   ALKPHOS 61 05/27/2018 0651   AST 18 05/27/2018 0651   ALT 10 05/27/2018 0651    BILITOT 0.5 05/27/2018 0651   BILITOT 0.2 06/17/2016 0819     Lab  Results  Component Value Date   WBC 11.5 (H) 05/27/2018   HGB 11.4 (L) 05/27/2018   HCT 36.0 05/27/2018   MCV 103.2 (H) 05/27/2018   PLT 260 05/27/2018   Lab Results  Component Value Date   INR 3.8 (A) 05/31/2018   INR 4.7 (A) 05/26/2018   INR 2.51 05/22/2018  EXAM: BP 133/64 (BP Location: Left Arm, Patient Position: Sitting, Cuff Size: Normal)   Pulse 80   Temp 98.1 F (36.7 C) (Oral)   Resp 16   Ht 4\' 11"  (1.499 m)   SpO2 95%   BMI 24.44 kg/m  She is on Room air Gen: Alert, well appearing, sitting in WC.  Patient is oriented to person, place, time, and situation. AFFECT: pleasant, lucid thought and speech. TIR:WERX: no injection, icteris, swelling, or exudate.  EOMI, PERRLA. Mouth: lips without lesion/swelling.  Oral mucosa pink and moist. Oropharynx without erythema, exudate, or swelling.  CV: RRR, no m/r/g.   LUNGS: CTA bilat, nonlabored resps, good aeration in all lung fields. ABD: soft, NT, ND, BS normal.  No hepatospenomegaly or mass.  No bruits. Left hip: TTP over greater troch region. Pain in L hip with flexion and aBduction. No TTP of spine.   ASSESSMENT/PLAN:  1) Left greater trochanter fracture. She will begin home PT. Pain control with vicodin 5/325 for mild/mod pain, and with oxycodone 5mg  for severe pain. Patient and daughter expressed understanding of the difference between these meds and their role in controlling her pain.  RF zofran as well since this helps the recurrent nausea she gets from dialysis and/or use of pain medications.  (I rx'd #60 of each of these pain meds today).  2) Acute hypoxic resp failure: this was while in hosp b/c of some fluid overload.  She was dialyzed in hosp and then her oxygen needs abated---was able to be on RA and remains well on RA at this time.  3) ESRD, on HD T/Th/Sat: fluid balance fine right now. Continue current dialysis schedule.  4) Sick sinus  syndrome/PAF: she has pacer and is on coumadin.  INR's managed by nephrology. No sign of abnl blood loss.  Most recent CBC stable.  5) Chronic constipation: failed multiple meds (see HPI). Will do trial of amitiza 24 mcg bid.  Medical decision making of moderate complexity was utilized today.  FOLLOW UP:  1 mo f/u pain and constipation  Signed:  Crissie Sickles, MD           06/08/2018

## 2018-06-04 DIAGNOSIS — N186 End stage renal disease: Secondary | ICD-10-CM | POA: Diagnosis not present

## 2018-06-04 DIAGNOSIS — D689 Coagulation defect, unspecified: Secondary | ICD-10-CM | POA: Diagnosis not present

## 2018-06-04 DIAGNOSIS — E876 Hypokalemia: Secondary | ICD-10-CM | POA: Diagnosis not present

## 2018-06-04 DIAGNOSIS — N2581 Secondary hyperparathyroidism of renal origin: Secondary | ICD-10-CM | POA: Diagnosis not present

## 2018-06-04 DIAGNOSIS — D509 Iron deficiency anemia, unspecified: Secondary | ICD-10-CM | POA: Diagnosis not present

## 2018-06-06 NOTE — Telephone Encounter (Signed)
Left detailed message on Jim's vm advising him that that Dr. Anitra Lauth has approved PT for 2 days a week for 4 weeks for functional mobility and pt is okay to bear weight.

## 2018-06-07 DIAGNOSIS — E876 Hypokalemia: Secondary | ICD-10-CM | POA: Diagnosis not present

## 2018-06-07 DIAGNOSIS — N186 End stage renal disease: Secondary | ICD-10-CM | POA: Diagnosis not present

## 2018-06-07 DIAGNOSIS — N2581 Secondary hyperparathyroidism of renal origin: Secondary | ICD-10-CM | POA: Diagnosis not present

## 2018-06-07 DIAGNOSIS — D509 Iron deficiency anemia, unspecified: Secondary | ICD-10-CM | POA: Diagnosis not present

## 2018-06-07 DIAGNOSIS — D689 Coagulation defect, unspecified: Secondary | ICD-10-CM | POA: Diagnosis not present

## 2018-06-08 ENCOUNTER — Ambulatory Visit (INDEPENDENT_AMBULATORY_CARE_PROVIDER_SITE_OTHER): Payer: Medicare Other | Admitting: Pharmacist

## 2018-06-08 ENCOUNTER — Ambulatory Visit (INDEPENDENT_AMBULATORY_CARE_PROVIDER_SITE_OTHER): Payer: Medicare Other

## 2018-06-08 DIAGNOSIS — N186 End stage renal disease: Secondary | ICD-10-CM | POA: Diagnosis not present

## 2018-06-08 DIAGNOSIS — Z7901 Long term (current) use of anticoagulants: Secondary | ICD-10-CM

## 2018-06-08 DIAGNOSIS — Z79891 Long term (current) use of opiate analgesic: Secondary | ICD-10-CM | POA: Diagnosis not present

## 2018-06-08 DIAGNOSIS — I495 Sick sinus syndrome: Secondary | ICD-10-CM | POA: Diagnosis not present

## 2018-06-08 DIAGNOSIS — Z8673 Personal history of transient ischemic attack (TIA), and cerebral infarction without residual deficits: Secondary | ICD-10-CM | POA: Diagnosis not present

## 2018-06-08 DIAGNOSIS — I428 Other cardiomyopathies: Secondary | ICD-10-CM

## 2018-06-08 DIAGNOSIS — D638 Anemia in other chronic diseases classified elsewhere: Secondary | ICD-10-CM | POA: Diagnosis not present

## 2018-06-08 DIAGNOSIS — M25552 Pain in left hip: Secondary | ICD-10-CM | POA: Diagnosis not present

## 2018-06-08 DIAGNOSIS — I4891 Unspecified atrial fibrillation: Secondary | ICD-10-CM | POA: Diagnosis not present

## 2018-06-08 DIAGNOSIS — I480 Paroxysmal atrial fibrillation: Secondary | ICD-10-CM

## 2018-06-08 DIAGNOSIS — J9621 Acute and chronic respiratory failure with hypoxia: Secondary | ICD-10-CM | POA: Diagnosis not present

## 2018-06-08 DIAGNOSIS — Z992 Dependence on renal dialysis: Secondary | ICD-10-CM | POA: Diagnosis not present

## 2018-06-08 DIAGNOSIS — I5042 Chronic combined systolic (congestive) and diastolic (congestive) heart failure: Secondary | ICD-10-CM | POA: Diagnosis not present

## 2018-06-08 DIAGNOSIS — I739 Peripheral vascular disease, unspecified: Secondary | ICD-10-CM | POA: Diagnosis not present

## 2018-06-08 DIAGNOSIS — Z993 Dependence on wheelchair: Secondary | ICD-10-CM | POA: Diagnosis not present

## 2018-06-08 DIAGNOSIS — Z950 Presence of cardiac pacemaker: Secondary | ICD-10-CM | POA: Diagnosis not present

## 2018-06-08 DIAGNOSIS — I132 Hypertensive heart and chronic kidney disease with heart failure and with stage 5 chronic kidney disease, or end stage renal disease: Secondary | ICD-10-CM | POA: Diagnosis not present

## 2018-06-08 LAB — POCT INR: INR: 1.7 — AB (ref 2.0–3.0)

## 2018-06-09 DIAGNOSIS — M25552 Pain in left hip: Secondary | ICD-10-CM | POA: Diagnosis not present

## 2018-06-09 DIAGNOSIS — I739 Peripheral vascular disease, unspecified: Secondary | ICD-10-CM

## 2018-06-09 DIAGNOSIS — D509 Iron deficiency anemia, unspecified: Secondary | ICD-10-CM | POA: Diagnosis not present

## 2018-06-09 DIAGNOSIS — I4891 Unspecified atrial fibrillation: Secondary | ICD-10-CM

## 2018-06-09 DIAGNOSIS — D689 Coagulation defect, unspecified: Secondary | ICD-10-CM | POA: Diagnosis not present

## 2018-06-09 DIAGNOSIS — Z7901 Long term (current) use of anticoagulants: Secondary | ICD-10-CM

## 2018-06-09 DIAGNOSIS — Z993 Dependence on wheelchair: Secondary | ICD-10-CM

## 2018-06-09 DIAGNOSIS — D638 Anemia in other chronic diseases classified elsewhere: Secondary | ICD-10-CM

## 2018-06-09 DIAGNOSIS — N186 End stage renal disease: Secondary | ICD-10-CM | POA: Diagnosis not present

## 2018-06-09 DIAGNOSIS — I132 Hypertensive heart and chronic kidney disease with heart failure and with stage 5 chronic kidney disease, or end stage renal disease: Secondary | ICD-10-CM | POA: Diagnosis not present

## 2018-06-09 DIAGNOSIS — Z79891 Long term (current) use of opiate analgesic: Secondary | ICD-10-CM

## 2018-06-09 DIAGNOSIS — Z8673 Personal history of transient ischemic attack (TIA), and cerebral infarction without residual deficits: Secondary | ICD-10-CM

## 2018-06-09 DIAGNOSIS — N2581 Secondary hyperparathyroidism of renal origin: Secondary | ICD-10-CM | POA: Diagnosis not present

## 2018-06-09 DIAGNOSIS — Z992 Dependence on renal dialysis: Secondary | ICD-10-CM

## 2018-06-09 DIAGNOSIS — J9621 Acute and chronic respiratory failure with hypoxia: Secondary | ICD-10-CM | POA: Diagnosis not present

## 2018-06-09 DIAGNOSIS — Z950 Presence of cardiac pacemaker: Secondary | ICD-10-CM

## 2018-06-09 DIAGNOSIS — E876 Hypokalemia: Secondary | ICD-10-CM | POA: Diagnosis not present

## 2018-06-09 DIAGNOSIS — I5042 Chronic combined systolic (congestive) and diastolic (congestive) heart failure: Secondary | ICD-10-CM | POA: Diagnosis not present

## 2018-06-09 LAB — CUP PACEART REMOTE DEVICE CHECK
Battery Remaining Longevity: 125 mo
Battery Remaining Percentage: 95.5 %
Battery Voltage: 3.02 V
Brady Statistic AP VP Percent: 1 %
Brady Statistic AP VS Percent: 5.8 %
Brady Statistic AS VS Percent: 94 %
Brady Statistic RV Percent Paced: 1 %
Date Time Interrogation Session: 20200122180746
Implantable Lead Implant Date: 20180719
Implantable Lead Implant Date: 20180719
Implantable Lead Location: 753859
Implantable Lead Location: 753860
Implantable Pulse Generator Implant Date: 20180719
Lead Channel Impedance Value: 400 Ohm
Lead Channel Impedance Value: 450 Ohm
Lead Channel Pacing Threshold Amplitude: 0.75 V
Lead Channel Pacing Threshold Amplitude: 0.75 V
Lead Channel Pacing Threshold Pulse Width: 0.5 ms
Lead Channel Pacing Threshold Pulse Width: 0.5 ms
Lead Channel Sensing Intrinsic Amplitude: 11.8 mV
Lead Channel Sensing Intrinsic Amplitude: 4.3 mV
Lead Channel Setting Pacing Amplitude: 2 V
Lead Channel Setting Pacing Amplitude: 2.5 V
Lead Channel Setting Pacing Pulse Width: 0.5 ms
Lead Channel Setting Sensing Sensitivity: 2 mV
MDC IDC STAT BRADY AS VP PERCENT: 1 %
MDC IDC STAT BRADY RA PERCENT PACED: 5.6 %
Pulse Gen Serial Number: 8918511

## 2018-06-09 NOTE — Progress Notes (Signed)
Remote pacemaker transmission.   

## 2018-06-10 ENCOUNTER — Encounter: Payer: Self-pay | Admitting: Cardiology

## 2018-06-10 DIAGNOSIS — I132 Hypertensive heart and chronic kidney disease with heart failure and with stage 5 chronic kidney disease, or end stage renal disease: Secondary | ICD-10-CM | POA: Diagnosis not present

## 2018-06-10 DIAGNOSIS — J9621 Acute and chronic respiratory failure with hypoxia: Secondary | ICD-10-CM | POA: Diagnosis not present

## 2018-06-10 DIAGNOSIS — Z8673 Personal history of transient ischemic attack (TIA), and cerebral infarction without residual deficits: Secondary | ICD-10-CM | POA: Diagnosis not present

## 2018-06-10 DIAGNOSIS — N186 End stage renal disease: Secondary | ICD-10-CM | POA: Diagnosis not present

## 2018-06-10 DIAGNOSIS — Z79891 Long term (current) use of opiate analgesic: Secondary | ICD-10-CM | POA: Diagnosis not present

## 2018-06-10 DIAGNOSIS — I4891 Unspecified atrial fibrillation: Secondary | ICD-10-CM | POA: Diagnosis not present

## 2018-06-10 DIAGNOSIS — I5042 Chronic combined systolic (congestive) and diastolic (congestive) heart failure: Secondary | ICD-10-CM | POA: Diagnosis not present

## 2018-06-10 DIAGNOSIS — I739 Peripheral vascular disease, unspecified: Secondary | ICD-10-CM | POA: Diagnosis not present

## 2018-06-10 DIAGNOSIS — M25552 Pain in left hip: Secondary | ICD-10-CM | POA: Diagnosis not present

## 2018-06-10 DIAGNOSIS — D638 Anemia in other chronic diseases classified elsewhere: Secondary | ICD-10-CM | POA: Diagnosis not present

## 2018-06-10 DIAGNOSIS — Z993 Dependence on wheelchair: Secondary | ICD-10-CM | POA: Diagnosis not present

## 2018-06-10 DIAGNOSIS — Z950 Presence of cardiac pacemaker: Secondary | ICD-10-CM | POA: Diagnosis not present

## 2018-06-10 DIAGNOSIS — Z992 Dependence on renal dialysis: Secondary | ICD-10-CM | POA: Diagnosis not present

## 2018-06-10 DIAGNOSIS — Z7901 Long term (current) use of anticoagulants: Secondary | ICD-10-CM | POA: Diagnosis not present

## 2018-06-11 DIAGNOSIS — D509 Iron deficiency anemia, unspecified: Secondary | ICD-10-CM | POA: Diagnosis not present

## 2018-06-11 DIAGNOSIS — N2581 Secondary hyperparathyroidism of renal origin: Secondary | ICD-10-CM | POA: Diagnosis not present

## 2018-06-11 DIAGNOSIS — N186 End stage renal disease: Secondary | ICD-10-CM | POA: Diagnosis not present

## 2018-06-11 DIAGNOSIS — D689 Coagulation defect, unspecified: Secondary | ICD-10-CM | POA: Diagnosis not present

## 2018-06-11 DIAGNOSIS — E876 Hypokalemia: Secondary | ICD-10-CM | POA: Diagnosis not present

## 2018-06-13 DIAGNOSIS — Z992 Dependence on renal dialysis: Secondary | ICD-10-CM | POA: Diagnosis not present

## 2018-06-13 DIAGNOSIS — M25552 Pain in left hip: Secondary | ICD-10-CM | POA: Diagnosis not present

## 2018-06-13 DIAGNOSIS — J9621 Acute and chronic respiratory failure with hypoxia: Secondary | ICD-10-CM | POA: Diagnosis not present

## 2018-06-13 DIAGNOSIS — I132 Hypertensive heart and chronic kidney disease with heart failure and with stage 5 chronic kidney disease, or end stage renal disease: Secondary | ICD-10-CM | POA: Diagnosis not present

## 2018-06-13 DIAGNOSIS — Z7901 Long term (current) use of anticoagulants: Secondary | ICD-10-CM | POA: Diagnosis not present

## 2018-06-13 DIAGNOSIS — Z993 Dependence on wheelchair: Secondary | ICD-10-CM | POA: Diagnosis not present

## 2018-06-13 DIAGNOSIS — I5042 Chronic combined systolic (congestive) and diastolic (congestive) heart failure: Secondary | ICD-10-CM | POA: Diagnosis not present

## 2018-06-13 DIAGNOSIS — Z79891 Long term (current) use of opiate analgesic: Secondary | ICD-10-CM | POA: Diagnosis not present

## 2018-06-13 DIAGNOSIS — N186 End stage renal disease: Secondary | ICD-10-CM | POA: Diagnosis not present

## 2018-06-13 DIAGNOSIS — Z8673 Personal history of transient ischemic attack (TIA), and cerebral infarction without residual deficits: Secondary | ICD-10-CM | POA: Diagnosis not present

## 2018-06-13 DIAGNOSIS — I739 Peripheral vascular disease, unspecified: Secondary | ICD-10-CM | POA: Diagnosis not present

## 2018-06-13 DIAGNOSIS — D638 Anemia in other chronic diseases classified elsewhere: Secondary | ICD-10-CM | POA: Diagnosis not present

## 2018-06-13 DIAGNOSIS — Z950 Presence of cardiac pacemaker: Secondary | ICD-10-CM | POA: Diagnosis not present

## 2018-06-13 DIAGNOSIS — I4891 Unspecified atrial fibrillation: Secondary | ICD-10-CM | POA: Diagnosis not present

## 2018-06-14 DIAGNOSIS — N2581 Secondary hyperparathyroidism of renal origin: Secondary | ICD-10-CM | POA: Diagnosis not present

## 2018-06-14 DIAGNOSIS — N186 End stage renal disease: Secondary | ICD-10-CM | POA: Diagnosis not present

## 2018-06-14 DIAGNOSIS — D689 Coagulation defect, unspecified: Secondary | ICD-10-CM | POA: Diagnosis not present

## 2018-06-14 DIAGNOSIS — E876 Hypokalemia: Secondary | ICD-10-CM | POA: Diagnosis not present

## 2018-06-14 DIAGNOSIS — D509 Iron deficiency anemia, unspecified: Secondary | ICD-10-CM | POA: Diagnosis not present

## 2018-06-15 ENCOUNTER — Ambulatory Visit (INDEPENDENT_AMBULATORY_CARE_PROVIDER_SITE_OTHER): Payer: Medicare Other | Admitting: Pharmacist

## 2018-06-15 DIAGNOSIS — Z7901 Long term (current) use of anticoagulants: Secondary | ICD-10-CM | POA: Diagnosis not present

## 2018-06-15 DIAGNOSIS — I4891 Unspecified atrial fibrillation: Secondary | ICD-10-CM

## 2018-06-15 LAB — POCT INR: INR: 2.8 (ref 2.0–3.0)

## 2018-06-16 DIAGNOSIS — N2581 Secondary hyperparathyroidism of renal origin: Secondary | ICD-10-CM | POA: Diagnosis not present

## 2018-06-16 DIAGNOSIS — N186 End stage renal disease: Secondary | ICD-10-CM | POA: Diagnosis not present

## 2018-06-16 DIAGNOSIS — D509 Iron deficiency anemia, unspecified: Secondary | ICD-10-CM | POA: Diagnosis not present

## 2018-06-16 DIAGNOSIS — D689 Coagulation defect, unspecified: Secondary | ICD-10-CM | POA: Diagnosis not present

## 2018-06-16 DIAGNOSIS — E876 Hypokalemia: Secondary | ICD-10-CM | POA: Diagnosis not present

## 2018-06-17 DIAGNOSIS — I5042 Chronic combined systolic (congestive) and diastolic (congestive) heart failure: Secondary | ICD-10-CM | POA: Diagnosis not present

## 2018-06-17 DIAGNOSIS — J9621 Acute and chronic respiratory failure with hypoxia: Secondary | ICD-10-CM | POA: Diagnosis not present

## 2018-06-17 DIAGNOSIS — Z993 Dependence on wheelchair: Secondary | ICD-10-CM | POA: Diagnosis not present

## 2018-06-17 DIAGNOSIS — Z7901 Long term (current) use of anticoagulants: Secondary | ICD-10-CM | POA: Diagnosis not present

## 2018-06-17 DIAGNOSIS — I132 Hypertensive heart and chronic kidney disease with heart failure and with stage 5 chronic kidney disease, or end stage renal disease: Secondary | ICD-10-CM | POA: Diagnosis not present

## 2018-06-17 DIAGNOSIS — Z950 Presence of cardiac pacemaker: Secondary | ICD-10-CM | POA: Diagnosis not present

## 2018-06-17 DIAGNOSIS — D638 Anemia in other chronic diseases classified elsewhere: Secondary | ICD-10-CM | POA: Diagnosis not present

## 2018-06-17 DIAGNOSIS — Z8673 Personal history of transient ischemic attack (TIA), and cerebral infarction without residual deficits: Secondary | ICD-10-CM | POA: Diagnosis not present

## 2018-06-17 DIAGNOSIS — I4891 Unspecified atrial fibrillation: Secondary | ICD-10-CM | POA: Diagnosis not present

## 2018-06-17 DIAGNOSIS — N186 End stage renal disease: Secondary | ICD-10-CM | POA: Diagnosis not present

## 2018-06-17 DIAGNOSIS — M25552 Pain in left hip: Secondary | ICD-10-CM | POA: Diagnosis not present

## 2018-06-17 DIAGNOSIS — I120 Hypertensive chronic kidney disease with stage 5 chronic kidney disease or end stage renal disease: Secondary | ICD-10-CM | POA: Diagnosis not present

## 2018-06-17 DIAGNOSIS — Z79891 Long term (current) use of opiate analgesic: Secondary | ICD-10-CM | POA: Diagnosis not present

## 2018-06-17 DIAGNOSIS — Z992 Dependence on renal dialysis: Secondary | ICD-10-CM | POA: Diagnosis not present

## 2018-06-17 DIAGNOSIS — I739 Peripheral vascular disease, unspecified: Secondary | ICD-10-CM | POA: Diagnosis not present

## 2018-06-18 DIAGNOSIS — E876 Hypokalemia: Secondary | ICD-10-CM | POA: Diagnosis not present

## 2018-06-18 DIAGNOSIS — N186 End stage renal disease: Secondary | ICD-10-CM | POA: Diagnosis not present

## 2018-06-18 DIAGNOSIS — D509 Iron deficiency anemia, unspecified: Secondary | ICD-10-CM | POA: Diagnosis not present

## 2018-06-18 DIAGNOSIS — N2581 Secondary hyperparathyroidism of renal origin: Secondary | ICD-10-CM | POA: Diagnosis not present

## 2018-06-18 DIAGNOSIS — D689 Coagulation defect, unspecified: Secondary | ICD-10-CM | POA: Diagnosis not present

## 2018-06-20 DIAGNOSIS — I4891 Unspecified atrial fibrillation: Secondary | ICD-10-CM | POA: Diagnosis not present

## 2018-06-20 DIAGNOSIS — Z7901 Long term (current) use of anticoagulants: Secondary | ICD-10-CM | POA: Diagnosis not present

## 2018-06-20 DIAGNOSIS — M25552 Pain in left hip: Secondary | ICD-10-CM | POA: Diagnosis not present

## 2018-06-20 DIAGNOSIS — Z993 Dependence on wheelchair: Secondary | ICD-10-CM | POA: Diagnosis not present

## 2018-06-20 DIAGNOSIS — Z992 Dependence on renal dialysis: Secondary | ICD-10-CM | POA: Diagnosis not present

## 2018-06-20 DIAGNOSIS — Z950 Presence of cardiac pacemaker: Secondary | ICD-10-CM | POA: Diagnosis not present

## 2018-06-20 DIAGNOSIS — I5042 Chronic combined systolic (congestive) and diastolic (congestive) heart failure: Secondary | ICD-10-CM | POA: Diagnosis not present

## 2018-06-20 DIAGNOSIS — Z8673 Personal history of transient ischemic attack (TIA), and cerebral infarction without residual deficits: Secondary | ICD-10-CM | POA: Diagnosis not present

## 2018-06-20 DIAGNOSIS — Z79891 Long term (current) use of opiate analgesic: Secondary | ICD-10-CM | POA: Diagnosis not present

## 2018-06-20 DIAGNOSIS — D638 Anemia in other chronic diseases classified elsewhere: Secondary | ICD-10-CM | POA: Diagnosis not present

## 2018-06-20 DIAGNOSIS — I739 Peripheral vascular disease, unspecified: Secondary | ICD-10-CM | POA: Diagnosis not present

## 2018-06-20 DIAGNOSIS — I132 Hypertensive heart and chronic kidney disease with heart failure and with stage 5 chronic kidney disease, or end stage renal disease: Secondary | ICD-10-CM | POA: Diagnosis not present

## 2018-06-20 DIAGNOSIS — N186 End stage renal disease: Secondary | ICD-10-CM | POA: Diagnosis not present

## 2018-06-20 DIAGNOSIS — J9621 Acute and chronic respiratory failure with hypoxia: Secondary | ICD-10-CM | POA: Diagnosis not present

## 2018-06-21 DIAGNOSIS — E876 Hypokalemia: Secondary | ICD-10-CM | POA: Diagnosis not present

## 2018-06-21 DIAGNOSIS — D509 Iron deficiency anemia, unspecified: Secondary | ICD-10-CM | POA: Diagnosis not present

## 2018-06-21 DIAGNOSIS — D689 Coagulation defect, unspecified: Secondary | ICD-10-CM | POA: Diagnosis not present

## 2018-06-21 DIAGNOSIS — N2581 Secondary hyperparathyroidism of renal origin: Secondary | ICD-10-CM | POA: Diagnosis not present

## 2018-06-21 DIAGNOSIS — N186 End stage renal disease: Secondary | ICD-10-CM | POA: Diagnosis not present

## 2018-06-22 ENCOUNTER — Ambulatory Visit (INDEPENDENT_AMBULATORY_CARE_PROVIDER_SITE_OTHER): Payer: Medicare Other | Admitting: Pharmacist Clinician (PhC)/ Clinical Pharmacy Specialist

## 2018-06-22 DIAGNOSIS — I5041 Acute combined systolic (congestive) and diastolic (congestive) heart failure: Secondary | ICD-10-CM | POA: Diagnosis not present

## 2018-06-22 DIAGNOSIS — I4891 Unspecified atrial fibrillation: Secondary | ICD-10-CM

## 2018-06-22 DIAGNOSIS — Z7901 Long term (current) use of anticoagulants: Secondary | ICD-10-CM

## 2018-06-22 DIAGNOSIS — J9621 Acute and chronic respiratory failure with hypoxia: Secondary | ICD-10-CM | POA: Diagnosis not present

## 2018-06-22 LAB — POCT INR: INR: 2 (ref 2.0–3.0)

## 2018-06-23 DIAGNOSIS — D689 Coagulation defect, unspecified: Secondary | ICD-10-CM | POA: Diagnosis not present

## 2018-06-23 DIAGNOSIS — E876 Hypokalemia: Secondary | ICD-10-CM | POA: Diagnosis not present

## 2018-06-23 DIAGNOSIS — D509 Iron deficiency anemia, unspecified: Secondary | ICD-10-CM | POA: Diagnosis not present

## 2018-06-23 DIAGNOSIS — N2581 Secondary hyperparathyroidism of renal origin: Secondary | ICD-10-CM | POA: Diagnosis not present

## 2018-06-23 DIAGNOSIS — N186 End stage renal disease: Secondary | ICD-10-CM | POA: Diagnosis not present

## 2018-06-23 LAB — POCT INR
INR: 2.3 — AB (ref 0.9–1.1)
INR: 2.3 — AB (ref 0.9–1.1)

## 2018-06-23 LAB — CBC AND DIFFERENTIAL
HCT: 37 (ref 36–46)
HEMOGLOBIN: 12.4 (ref 12.0–16.0)
HEMOGLOBIN: 12.4 (ref 12.0–16.0)
WBC: 7.7

## 2018-06-23 LAB — BASIC METABOLIC PANEL
BUN: 30 — AB (ref 4–21)
Creatinine: 5.7 — AB (ref 0.5–1.1)
Potassium: 4.6 (ref 3.4–5.3)
Sodium: 138 (ref 137–147)

## 2018-06-23 LAB — IRON,TIBC AND FERRITIN PANEL
Iron: 60
TIBC: 193
UIBC: 133

## 2018-06-23 LAB — PROTIME-INR
PROTIME: 24.3 — AB (ref 10.0–13.8)
Protime: 24.3 — AB (ref 10.0–13.8)

## 2018-06-24 DIAGNOSIS — Z8673 Personal history of transient ischemic attack (TIA), and cerebral infarction without residual deficits: Secondary | ICD-10-CM | POA: Diagnosis not present

## 2018-06-24 DIAGNOSIS — I4891 Unspecified atrial fibrillation: Secondary | ICD-10-CM | POA: Diagnosis not present

## 2018-06-24 DIAGNOSIS — I5042 Chronic combined systolic (congestive) and diastolic (congestive) heart failure: Secondary | ICD-10-CM | POA: Diagnosis not present

## 2018-06-24 DIAGNOSIS — Z992 Dependence on renal dialysis: Secondary | ICD-10-CM | POA: Diagnosis not present

## 2018-06-24 DIAGNOSIS — I739 Peripheral vascular disease, unspecified: Secondary | ICD-10-CM | POA: Diagnosis not present

## 2018-06-24 DIAGNOSIS — M25552 Pain in left hip: Secondary | ICD-10-CM | POA: Diagnosis not present

## 2018-06-24 DIAGNOSIS — Z7901 Long term (current) use of anticoagulants: Secondary | ICD-10-CM | POA: Diagnosis not present

## 2018-06-24 DIAGNOSIS — J9621 Acute and chronic respiratory failure with hypoxia: Secondary | ICD-10-CM | POA: Diagnosis not present

## 2018-06-24 DIAGNOSIS — I132 Hypertensive heart and chronic kidney disease with heart failure and with stage 5 chronic kidney disease, or end stage renal disease: Secondary | ICD-10-CM | POA: Diagnosis not present

## 2018-06-24 DIAGNOSIS — D638 Anemia in other chronic diseases classified elsewhere: Secondary | ICD-10-CM | POA: Diagnosis not present

## 2018-06-24 DIAGNOSIS — Z79891 Long term (current) use of opiate analgesic: Secondary | ICD-10-CM | POA: Diagnosis not present

## 2018-06-24 DIAGNOSIS — Z950 Presence of cardiac pacemaker: Secondary | ICD-10-CM | POA: Diagnosis not present

## 2018-06-24 DIAGNOSIS — N186 End stage renal disease: Secondary | ICD-10-CM | POA: Diagnosis not present

## 2018-06-24 DIAGNOSIS — Z993 Dependence on wheelchair: Secondary | ICD-10-CM | POA: Diagnosis not present

## 2018-06-25 DIAGNOSIS — D689 Coagulation defect, unspecified: Secondary | ICD-10-CM | POA: Diagnosis not present

## 2018-06-25 DIAGNOSIS — E876 Hypokalemia: Secondary | ICD-10-CM | POA: Diagnosis not present

## 2018-06-25 DIAGNOSIS — D509 Iron deficiency anemia, unspecified: Secondary | ICD-10-CM | POA: Diagnosis not present

## 2018-06-25 DIAGNOSIS — N2581 Secondary hyperparathyroidism of renal origin: Secondary | ICD-10-CM | POA: Diagnosis not present

## 2018-06-25 DIAGNOSIS — N186 End stage renal disease: Secondary | ICD-10-CM | POA: Diagnosis not present

## 2018-06-26 ENCOUNTER — Other Ambulatory Visit: Payer: Self-pay | Admitting: Cardiovascular Disease

## 2018-06-28 DIAGNOSIS — N2581 Secondary hyperparathyroidism of renal origin: Secondary | ICD-10-CM | POA: Diagnosis not present

## 2018-06-28 DIAGNOSIS — D509 Iron deficiency anemia, unspecified: Secondary | ICD-10-CM | POA: Diagnosis not present

## 2018-06-28 DIAGNOSIS — D689 Coagulation defect, unspecified: Secondary | ICD-10-CM | POA: Diagnosis not present

## 2018-06-28 DIAGNOSIS — N186 End stage renal disease: Secondary | ICD-10-CM | POA: Diagnosis not present

## 2018-06-28 DIAGNOSIS — E876 Hypokalemia: Secondary | ICD-10-CM | POA: Diagnosis not present

## 2018-06-30 DIAGNOSIS — N186 End stage renal disease: Secondary | ICD-10-CM | POA: Diagnosis not present

## 2018-06-30 DIAGNOSIS — D689 Coagulation defect, unspecified: Secondary | ICD-10-CM | POA: Diagnosis not present

## 2018-06-30 DIAGNOSIS — N2581 Secondary hyperparathyroidism of renal origin: Secondary | ICD-10-CM | POA: Diagnosis not present

## 2018-06-30 DIAGNOSIS — E876 Hypokalemia: Secondary | ICD-10-CM | POA: Diagnosis not present

## 2018-06-30 DIAGNOSIS — D509 Iron deficiency anemia, unspecified: Secondary | ICD-10-CM | POA: Diagnosis not present

## 2018-06-30 LAB — POCT INR
INR: 2.1 — AB (ref 0.9–1.1)
INR: 2.1 — AB (ref 0.9–1.1)

## 2018-06-30 LAB — CBC AND DIFFERENTIAL
Hemoglobin: 11.2 — AB (ref 12.0–16.0)
Hemoglobin: 11.2 — AB (ref 12.0–16.0)

## 2018-06-30 LAB — BASIC METABOLIC PANEL
Potassium: 4.2 (ref 3.4–5.3)
Potassium: 4.2 (ref 3.4–5.3)

## 2018-06-30 LAB — PROTIME-INR
Protime: 22.9 — AB (ref 10.0–13.8)
Protime: 22.9 — AB (ref 10.0–13.8)

## 2018-07-02 DIAGNOSIS — D509 Iron deficiency anemia, unspecified: Secondary | ICD-10-CM | POA: Diagnosis not present

## 2018-07-02 DIAGNOSIS — D689 Coagulation defect, unspecified: Secondary | ICD-10-CM | POA: Diagnosis not present

## 2018-07-02 DIAGNOSIS — N186 End stage renal disease: Secondary | ICD-10-CM | POA: Diagnosis not present

## 2018-07-02 DIAGNOSIS — E876 Hypokalemia: Secondary | ICD-10-CM | POA: Diagnosis not present

## 2018-07-02 DIAGNOSIS — N2581 Secondary hyperparathyroidism of renal origin: Secondary | ICD-10-CM | POA: Diagnosis not present

## 2018-07-05 ENCOUNTER — Encounter: Payer: Self-pay | Admitting: *Deleted

## 2018-07-05 DIAGNOSIS — D509 Iron deficiency anemia, unspecified: Secondary | ICD-10-CM | POA: Diagnosis not present

## 2018-07-05 DIAGNOSIS — N2581 Secondary hyperparathyroidism of renal origin: Secondary | ICD-10-CM | POA: Diagnosis not present

## 2018-07-05 DIAGNOSIS — E876 Hypokalemia: Secondary | ICD-10-CM | POA: Diagnosis not present

## 2018-07-05 DIAGNOSIS — N186 End stage renal disease: Secondary | ICD-10-CM | POA: Diagnosis not present

## 2018-07-05 DIAGNOSIS — D689 Coagulation defect, unspecified: Secondary | ICD-10-CM | POA: Diagnosis not present

## 2018-07-06 ENCOUNTER — Ambulatory Visit (INDEPENDENT_AMBULATORY_CARE_PROVIDER_SITE_OTHER): Payer: Medicare Other | Admitting: Family Medicine

## 2018-07-06 ENCOUNTER — Encounter: Payer: Self-pay | Admitting: Family Medicine

## 2018-07-06 VITALS — BP 144/66 | HR 80 | Temp 97.5°F | Resp 16 | Ht 59.0 in | Wt 120.0 lb

## 2018-07-06 DIAGNOSIS — K5909 Other constipation: Secondary | ICD-10-CM | POA: Diagnosis not present

## 2018-07-06 DIAGNOSIS — G8929 Other chronic pain: Secondary | ICD-10-CM

## 2018-07-06 DIAGNOSIS — G894 Chronic pain syndrome: Secondary | ICD-10-CM

## 2018-07-06 DIAGNOSIS — M25552 Pain in left hip: Secondary | ICD-10-CM

## 2018-07-06 DIAGNOSIS — M546 Pain in thoracic spine: Secondary | ICD-10-CM

## 2018-07-06 DIAGNOSIS — M545 Low back pain, unspecified: Secondary | ICD-10-CM

## 2018-07-06 MED ORDER — OXYCODONE HCL 5 MG PO TABS: ORAL_TABLET | ORAL | 0 refills | Status: DC

## 2018-07-06 NOTE — Progress Notes (Signed)
OFFICE VISIT  07/17/2018   CC:  Chief Complaint  Patient presents with  . Follow-up    pain, constipation   HPI:    [name] who presents for 1 mo f/u pain and chronic constipation.  Indication for chronic opioid: chronic mid back pain secondary to compression fractures, kyphotic posture.  Also chronic LBP from lumbar spondylosis. Also, most recently, a left greater troch fracture. Medication and dose: see below. # pills per month: see below. Last UDS date: none Opioid Treatment Agreement signed (Y/N): 03/28/2018 Opioid Treatment Agreement last reviewed with patient:  today. [place] reviewed this encounter (include red flags):  yes, no red flags.  At the time of last visit, she was getting set to start PT for her L greater troch fracture. I rx'd pt vicodin 5/325 for mild/mod pain, #60 AND oxycodone 5mg  to use for severe pain, #60-->last visit. Left hip is feeling well.   If standing and more active/walking, her pain is enough to need an oxycodone-->about 1 tab every 2-3 days. Tylenol is her first line pain med.   I also started her on amitiza 24 mcg bid for her chronic constipation that has not been responsive to lots of first/second line meds.  She was unable to afford this medication. Using senna and/or dulcolax, occ resorts to [employer] and seems to be doing ok on this now.       Past Medical History:  Diagnosis Date  . Anemia of chronic disease 2018   + anemia of CRI?  [name] Atrial fibrillation ([place]) [I48.91] 12/11/2016  . Atrophy of left kidney    with absent blood flow by renal artery dopplers (Dr. Gwenlyn Found)  . 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 Chronic combined systolic and diastolic CHF (congestive heart failure) ([place])    a. 11/2016: echo showing EF of 35-40%, RV strain noted, mild MR and mild TR.   Echo showed much improved EF 12/2017 (55-60%)  . Chronic respiratory failure with hypoxia ([place]) 02/11/2017   Now on chronic O2  (intermittent as of summer 2019).    . Debilitated patient    WC dependent as of 2018  . ESRD on hemodialysis ([place]) 01/2017   T/Th/Sat schedule- [place].  Right basilic AV fistula 14/9702--OVZ unable to successfully access this.  Dr. Donnetta Hutching to do an upper arm AV graft as of 10/2017..  . History of adenomatous polyp of colon   . History of kidney stones    passed  . History of subarachnoid hemorrhage 10/2014   after syncope and while on xarelto  . HOH (hard of hearing)   . Hyperlipidemia   . Hypertension    Difficult to control, in the setting of one functioning kidney: pt was referred to nephrology by Dr. Gwenlyn Found 06/2015.  . Lumbar radiculopathy 2012  . Lumbar spinal stenosis 2019   facet injections only very short term relief.  L1 and L2 selective nerve root blocks helpful 04/2018.  . Lumbar spondylosis    MR 07/2016---no sign of spinal nerve compression or cord compression.  Pt set up with outpt ortho while admitted to hosp 07/2016.  . Malnourished ([place])   . Metatarsal fracture 06/10/2016   Nondisplaced, left 5th metatarsal--pt was referred to ortho  . Nonischemic cardiomyopathy ([place])    EF improved to 55-60% on echo 12/2017  . Osteopenia 2014   T-score -2.1  . PAF (paroxysmal atrial fibrillation) (HCC)    Eliquis started after BRAO and CVA.  She was changed to warfarin 2018--INR/coumadin mgmt per HD/nephrol.  . Presence of permanent cardiac pacemaker   . Right rib fracture 12/2016   s/p fall  . Sick sinus syndrome ([place]) 11/2016   Dual chamber pacer insertion 2018 (Dr. Lovena Le)  . Stroke ([place]) 88/8280   cardioembolic (had CVA while on no anticoag)--"scattered subacute punctate infarcts: 1 in R parietal lobe and 2 in occipital cortex" on MRI br.  CT angio head/neck: aortic arch athero.  R ICA 20% stenosis, L ICA w/out any stenosis.  . Thoracic back pain 01/2017; 01/2018   2018: Facet?  Dr. Ramos->steroid injection.  01/2018-->CT T spine to check for a new comp fx-->none found.   Selective nerve root inj in T spine helpful on R side.   . Thoracic compression fracture ([place]) 07/2016   T3.  T7 and T8-- T8 kyphoplasty during hosp admission 07/2016.  Neuro referred pt to pain mgmt for consideration of injection 09/2016.  I referred her to endo 08/2016 for consideration of calcitonin treatment.    Past Surgical History:  Procedure Laterality Date  . APPENDECTOMY  child  . AV FISTULA PLACEMENT Right 03/31/2017   Procedure: Right ARM Basilic vein transposition;  Surgeon: Conrad Wetumka, MD;  Location[facility];  Service: Vascular;  Laterality: Right;  . AV FISTULA PLACEMENT Right 12/06/2017   Procedure: INSERTION OF ARTERIOVENOUS (AV) GORE-TEX GRAFT ARM RIGHT UPPER EXTREMITY;  Surgeon: [name], MD;  Location: [place];  Service: Vascular;  Laterality: Right;  . CARDIOVASCULAR STRESS TEST  01/31/2016   Stress myoview: NORMAL/Low risk.  EF 56%.  . [place]  . COLONOSCOPY  2015   + hx of adenomatous polyps.  Need digest health spec in [place] records to see when pt due for next colonoscopy  . EYE SURGERY Bilateral    Catarct  . INSERTION OF DIALYSIS CATHETER Right 01/29/2017   Procedure: INSERTION OF DIALYSIS CATHETER- RIGHT INTERNAL JUGULAR;  Surgeon: [name], MD;  [facility];  Service: Vascular;  Laterality: Right;  . IR GENERIC HISTORICAL  07/24/2016   IR KYPHO THORACIC WITH BONE BIOPSY 07/24/2016 Luanne Bras, MD [facility]-INTER[facility] RAD  . KYPHOPLASTY  07/27/2016   T8  . PACEMAKER IMPLANT N/A 12/03/2016   Procedure: Pacemaker Implant;  Surgeon: Evans Lance, MD;  Location: [place] CV LAB;  Service: Cardiovascular;  Laterality: N/A;  . Renal artery dopplers  02/26/2016   Her right renal dimension was 11 cm pole to pole with mild to moderate right renal artery stenosis. A right renal aortic ratio was 3.22 suggesting less than a 50% stenosis.  . Right upper arm AV Gore-Tex graft  12/06/2017   Dr. Donnetta Hutching  . TONSILLECTOMY    . TRANSTHORACIC ECHOCARDIOGRAM   01/29/2016; 11/2016; 12/2017   2017: EF 55-60%, normal LV wall motion, grade I DD.  No cardiac source of emboli was seen.  2018: EF 35-40%, could not assess DD due to a-fib, pulm HTN noted. 12/2017 EF 55-60%, nl wall motion, grd II DD, mod MR and pulm regurg, increased pulm pressure (peak 52).    Outpatient Medications Prior to Visit  Medication Sig Dispense Refill  . acetaminophen (TYLENOL) 500 MG tablet Take 500-1,000 mg 3 (three) times daily as needed by mouth for moderate pain.     [name] amiodarone (PACERONE) 200 MG tablet TAKE 1 TABLET BY MOUTH  DAILY (Patient taking differently: Take 200 mg by mouth daily. ) 90 tablet 2  . carvedilol (COREG) 3.125 MG tablet  TAKE 1 TABLET BY MOUTH TWO  TIMES DAILY 180 tablet 1  . HYDROcodone-acetaminophen (NORCO) 5-325 MG tablet Take 1-2 tablets by mouth every 6 (six) hours as needed for moderate pain or severe pain. 60 tablet 0  . isosorbide mononitrate (IMDUR) 60 MG 24 hr tablet TAKE 1 TABLET BY MOUTH  DAILY (Patient taking differently: Take 60 mg by mouth daily. ) 90 tablet 1  . ondansetron (ZOFRAN) 4 MG tablet Take 1 tablet (4 mg total) by mouth every 8 (eight) hours as needed for nausea or vomiting. 60 tablet 1  . polyvinyl alcohol (LIQUIFILM TEARS) 1.4 % ophthalmic solution Place 1-2 drops into both eyes as needed for dry eyes.    . pravastatin (PRAVACHOL) 40 MG tablet TAKE 1 TABLET BY MOUTH  EVERY EVENING (Patient taking differently: Take 40 mg by mouth daily. ) 90 tablet 1  . sevelamer carbonate (RENVELA) 800 MG tablet Take 800 mg by mouth 3 (three) times daily with meals.     . vitamin C (ASCORBIC ACID) 500 MG tablet Take 500 mg by mouth daily.    . Vitamin D, Ergocalciferol, (DRISDOL) 1.25 MG (50000 UT) CAPS capsule Take 1 capsule (50,000 Units total) by mouth every Thursday. 12 capsule 3  . warfarin (COUMADIN) 3 MG tablet TAKE 1/2 TO 1 TABLET BY  MOUTH DAILY AS DIRECTED BY  COUMADIN CLINIC (Patient taking differently: Take 1.5-3 mg by mouth See admin  instructions. Per MD as of 05/26/2018. Hold on 01/09 and 01/10. Take 1.5mg  01/11, 01/12, and 01/13. Will re-check levels on 01/13 and determine new directions.) 90 tablet 1  . oxyCODONE (OXY IR/ROXICODONE) 5 MG immediate release tablet 1-2 tabs po q6h prn severe pain 60 tablet 0  . lubiprostone (AMITIZA) 24 MCG capsule Take 1 capsule (24 mcg total) by mouth 2 (two) times daily with a meal. (Patient not taking: Reported on 07/06/2018) 60 capsule 2   No facility-administered medications prior to visit.     Allergies  Allergen Reactions  . Clonidine Derivatives Palpitations and Other (See Comments)    VERY SEDATED  . Adhesive [Tape] Other (See Comments)    Tears skin up  . Codeine Nausea Only  . Nickel Rash  . Sulfa Antibiotics Other (See Comments)    Reaction unknown    ROS As per HPI  PE: Blood pressure (!) 144/66, pulse 80, temperature (!) 97.5 F (36.4 C), temperature source Oral, resp. rate 16, height 4\' 11"  (1.499 m), weight 120 lb (54.4 kg), SpO2 95 %. Gen: Alert, well appearing.  Patient is oriented to person, place, time, and situation. AFFECT: pleasant, lucid thought and speech. No further exam today.  LABS:    Chemistry      Component Value Date/Time   NA 138 06/23/2018   K 4.2 06/30/2018   CL 91 (L) 05/27/2018 0651   CO2 31 05/27/2018 0651   BUN 30 (A) 06/23/2018   CREATININE 5.7 (A) 06/23/2018   CREATININE 3.46 (H) 05/27/2018 0651   CREATININE 1.63 (H) 03/24/2016 0110   GLU 128 09/01/2016      Component Value Date/Time   CALCIUM 8.8 (L) 05/27/2018 0651   CALCIUM 9.5 02/16/2017 0232   ALKPHOS 61 05/27/2018 0651   AST 18 05/27/2018 0651   ALT 10 05/27/2018 0651   BILITOT 0.5 05/27/2018 0651   BILITOT 0.2 06/17/2016 0819     Lab Results  Component Value Date   HGBA1C 5.2 12/15/2016    IMPRESSION AND PLAN:  1) chronic pain syndrome: bilat LBP  w/out radiculopathy + hx of thoracic compression fx/pain from kyphotic posture. Doing well at this time, not  requiring much opioid pain med.  No adverse effects of opioids except constipation, and this seems easier to treat lately. She'll call to request vicodin when she runs out of current supply at home. I did rx oxycodone 5mg , 1 q6 prn severe pain, #60-->instructions on rx to fill upon pt's request.  2) Chronic constipation, at least in part due to chronic opioid pain med use. She could not afford amitiza rx'd last visit.  She seems to be controlling things fine now with use of senna and /or dulcolax + prn miralax.  An After Visit Summary was printed and given to the patient.  FOLLOW UP: Return in about 3 months (around 10/04/2018) for routine chronic illness f/u.  Signed:  Crissie Sickles, MD           07/17/2018

## 2018-07-07 DIAGNOSIS — N2581 Secondary hyperparathyroidism of renal origin: Secondary | ICD-10-CM | POA: Diagnosis not present

## 2018-07-07 DIAGNOSIS — N186 End stage renal disease: Secondary | ICD-10-CM | POA: Diagnosis not present

## 2018-07-07 DIAGNOSIS — D509 Iron deficiency anemia, unspecified: Secondary | ICD-10-CM | POA: Diagnosis not present

## 2018-07-07 DIAGNOSIS — E876 Hypokalemia: Secondary | ICD-10-CM | POA: Diagnosis not present

## 2018-07-07 DIAGNOSIS — D689 Coagulation defect, unspecified: Secondary | ICD-10-CM | POA: Diagnosis not present

## 2018-07-07 LAB — POCT INR: INR: 2.4 — AB (ref 0.9–1.1)

## 2018-07-07 LAB — PROTIME-INR: Protime: 25.5 — AB (ref 10.0–13.8)

## 2018-07-07 LAB — CBC AND DIFFERENTIAL: Hemoglobin: 11 — AB (ref 12.0–16.0)

## 2018-07-07 LAB — BASIC METABOLIC PANEL: Potassium: 4.9 (ref 3.4–5.3)

## 2018-07-08 ENCOUNTER — Encounter: Payer: Self-pay | Admitting: Family Medicine

## 2018-07-09 DIAGNOSIS — E876 Hypokalemia: Secondary | ICD-10-CM | POA: Diagnosis not present

## 2018-07-09 DIAGNOSIS — D689 Coagulation defect, unspecified: Secondary | ICD-10-CM | POA: Diagnosis not present

## 2018-07-09 DIAGNOSIS — N2581 Secondary hyperparathyroidism of renal origin: Secondary | ICD-10-CM | POA: Diagnosis not present

## 2018-07-09 DIAGNOSIS — D509 Iron deficiency anemia, unspecified: Secondary | ICD-10-CM | POA: Diagnosis not present

## 2018-07-09 DIAGNOSIS — N186 End stage renal disease: Secondary | ICD-10-CM | POA: Diagnosis not present

## 2018-07-12 DIAGNOSIS — D689 Coagulation defect, unspecified: Secondary | ICD-10-CM | POA: Diagnosis not present

## 2018-07-12 DIAGNOSIS — D509 Iron deficiency anemia, unspecified: Secondary | ICD-10-CM | POA: Diagnosis not present

## 2018-07-12 DIAGNOSIS — N186 End stage renal disease: Secondary | ICD-10-CM | POA: Diagnosis not present

## 2018-07-12 DIAGNOSIS — N2581 Secondary hyperparathyroidism of renal origin: Secondary | ICD-10-CM | POA: Diagnosis not present

## 2018-07-12 DIAGNOSIS — E876 Hypokalemia: Secondary | ICD-10-CM | POA: Diagnosis not present

## 2018-07-14 DIAGNOSIS — N186 End stage renal disease: Secondary | ICD-10-CM | POA: Diagnosis not present

## 2018-07-14 DIAGNOSIS — D689 Coagulation defect, unspecified: Secondary | ICD-10-CM | POA: Diagnosis not present

## 2018-07-14 DIAGNOSIS — N2581 Secondary hyperparathyroidism of renal origin: Secondary | ICD-10-CM | POA: Diagnosis not present

## 2018-07-14 DIAGNOSIS — D509 Iron deficiency anemia, unspecified: Secondary | ICD-10-CM | POA: Diagnosis not present

## 2018-07-14 DIAGNOSIS — E876 Hypokalemia: Secondary | ICD-10-CM | POA: Diagnosis not present

## 2018-07-14 LAB — BASIC METABOLIC PANEL: Potassium: 4.7 (ref 3.4–5.3)

## 2018-07-14 LAB — CBC AND DIFFERENTIAL
HEMATOCRIT: 33 — AB (ref 36–46)
Hemoglobin: 11 — AB (ref 12.0–16.0)

## 2018-07-14 LAB — PROTIME-INR: Protime: 23.4 — AB (ref 10.0–13.8)

## 2018-07-14 LAB — POCT INR: INR: 2.2 — AB (ref 0.9–1.1)

## 2018-07-15 ENCOUNTER — Ambulatory Visit (INDEPENDENT_AMBULATORY_CARE_PROVIDER_SITE_OTHER): Payer: Medicare Other | Admitting: *Deleted

## 2018-07-15 DIAGNOSIS — I4891 Unspecified atrial fibrillation: Secondary | ICD-10-CM

## 2018-07-15 DIAGNOSIS — Z7901 Long term (current) use of anticoagulants: Secondary | ICD-10-CM

## 2018-07-15 LAB — POCT INR: INR: 1.9 — AB (ref 2.0–3.0)

## 2018-07-15 NOTE — Patient Instructions (Signed)
Description   Today take 1 tablet, then continue taking 1/2 tablet daily except for 1 tablet on Tuesdays and Thursdays.  INR in 2-3 weeks. Call us with any medication changes or concerns [phone].

## 2018-07-16 DIAGNOSIS — N186 End stage renal disease: Secondary | ICD-10-CM | POA: Diagnosis not present

## 2018-07-16 DIAGNOSIS — D509 Iron deficiency anemia, unspecified: Secondary | ICD-10-CM | POA: Diagnosis not present

## 2018-07-16 DIAGNOSIS — N2581 Secondary hyperparathyroidism of renal origin: Secondary | ICD-10-CM | POA: Diagnosis not present

## 2018-07-16 DIAGNOSIS — Z992 Dependence on renal dialysis: Secondary | ICD-10-CM | POA: Diagnosis not present

## 2018-07-16 DIAGNOSIS — E876 Hypokalemia: Secondary | ICD-10-CM | POA: Diagnosis not present

## 2018-07-16 DIAGNOSIS — D689 Coagulation defect, unspecified: Secondary | ICD-10-CM | POA: Diagnosis not present

## 2018-07-16 DIAGNOSIS — I120 Hypertensive chronic kidney disease with stage 5 chronic kidney disease or end stage renal disease: Secondary | ICD-10-CM | POA: Diagnosis not present

## 2018-07-18 ENCOUNTER — Emergency Department (HOSPITAL_COMMUNITY): Payer: Medicare Other

## 2018-07-18 ENCOUNTER — Inpatient Hospital Stay (HOSPITAL_COMMUNITY)
Admission: EM | Admit: 2018-07-18 | Discharge: 2018-07-22 | DRG: 535 | Disposition: A | Discharge status: Home Health | Payer: Medicare Other | Attending: Internal Medicine | Admitting: Internal Medicine

## 2018-07-18 DIAGNOSIS — R509 Fever, unspecified: Secondary | ICD-10-CM | POA: Diagnosis present

## 2018-07-18 DIAGNOSIS — Z950 Presence of cardiac pacemaker: Secondary | ICD-10-CM

## 2018-07-18 DIAGNOSIS — S32591A Other specified fracture of right pubis, initial encounter for closed fracture: Secondary | ICD-10-CM | POA: Diagnosis not present

## 2018-07-18 DIAGNOSIS — W19XXXA Unspecified fall, initial encounter: Secondary | ICD-10-CM | POA: Diagnosis not present

## 2018-07-18 DIAGNOSIS — R410 Disorientation, unspecified: Secondary | ICD-10-CM | POA: Diagnosis not present

## 2018-07-18 DIAGNOSIS — I132 Hypertensive heart and chronic kidney disease with heart failure and with stage 5 chronic kidney disease, or end stage renal disease: Secondary | ICD-10-CM | POA: Diagnosis present

## 2018-07-18 DIAGNOSIS — N186 End stage renal disease: Secondary | ICD-10-CM | POA: Diagnosis present

## 2018-07-18 DIAGNOSIS — Z8249 Family history of ischemic heart disease and other diseases of the circulatory system: Secondary | ICD-10-CM | POA: Diagnosis not present

## 2018-07-18 DIAGNOSIS — M8580 Other specified disorders of bone density and structure, unspecified site: Secondary | ICD-10-CM | POA: Diagnosis present

## 2018-07-18 DIAGNOSIS — I480 Paroxysmal atrial fibrillation: Secondary | ICD-10-CM | POA: Diagnosis present

## 2018-07-18 DIAGNOSIS — S72115A Nondisplaced fracture of greater trochanter of left femur, initial encounter for closed fracture: Secondary | ICD-10-CM | POA: Diagnosis not present

## 2018-07-18 DIAGNOSIS — N2581 Secondary hyperparathyroidism of renal origin: Secondary | ICD-10-CM | POA: Diagnosis not present

## 2018-07-18 DIAGNOSIS — S3282XA Multiple fractures of pelvis without disruption of pelvic ring, initial encounter for closed fracture: Secondary | ICD-10-CM

## 2018-07-18 DIAGNOSIS — Z8673 Personal history of transient ischemic attack (TIA), and cerebral infarction without residual deficits: Secondary | ICD-10-CM | POA: Diagnosis not present

## 2018-07-18 DIAGNOSIS — R52 Pain, unspecified: Secondary | ICD-10-CM | POA: Diagnosis not present

## 2018-07-18 DIAGNOSIS — S3210XA Unspecified fracture of sacrum, initial encounter for closed fracture: Secondary | ICD-10-CM | POA: Diagnosis not present

## 2018-07-18 DIAGNOSIS — Z87891 Personal history of nicotine dependence: Secondary | ICD-10-CM

## 2018-07-18 DIAGNOSIS — E785 Hyperlipidemia, unspecified: Secondary | ICD-10-CM | POA: Diagnosis not present

## 2018-07-18 DIAGNOSIS — S3023XA Contusion of vagina and vulva, initial encounter: Secondary | ICD-10-CM | POA: Diagnosis present

## 2018-07-18 DIAGNOSIS — Z7901 Long term (current) use of anticoagulants: Secondary | ICD-10-CM

## 2018-07-18 DIAGNOSIS — Y92009 Unspecified place in unspecified non-institutional (private) residence as the place of occurrence of the external cause: Secondary | ICD-10-CM

## 2018-07-18 DIAGNOSIS — S299XXA Unspecified injury of thorax, initial encounter: Secondary | ICD-10-CM | POA: Diagnosis not present

## 2018-07-18 DIAGNOSIS — S72112D Displaced fracture of greater trochanter of left femur, subsequent encounter for closed fracture with routine healing: Secondary | ICD-10-CM | POA: Diagnosis not present

## 2018-07-18 DIAGNOSIS — S32511A Fracture of superior rim of right pubis, initial encounter for closed fracture: Secondary | ICD-10-CM

## 2018-07-18 DIAGNOSIS — S3023XD Contusion of vagina and vulva, subsequent encounter: Secondary | ICD-10-CM | POA: Diagnosis not present

## 2018-07-18 DIAGNOSIS — D631 Anemia in chronic kidney disease: Secondary | ICD-10-CM | POA: Diagnosis not present

## 2018-07-18 DIAGNOSIS — D62 Acute posthemorrhagic anemia: Secondary | ICD-10-CM | POA: Diagnosis present

## 2018-07-18 DIAGNOSIS — I10 Essential (primary) hypertension: Secondary | ICD-10-CM | POA: Diagnosis not present

## 2018-07-18 DIAGNOSIS — Z992 Dependence on renal dialysis: Secondary | ICD-10-CM

## 2018-07-18 DIAGNOSIS — Z9181 History of falling: Secondary | ICD-10-CM | POA: Diagnosis not present

## 2018-07-18 DIAGNOSIS — S300XXA Contusion of lower back and pelvis, initial encounter: Secondary | ICD-10-CM | POA: Diagnosis not present

## 2018-07-18 DIAGNOSIS — W010XXA Fall on same level from slipping, tripping and stumbling without subsequent striking against object, initial encounter: Secondary | ICD-10-CM | POA: Diagnosis present

## 2018-07-18 DIAGNOSIS — S32599A Other specified fracture of unspecified pubis, initial encounter for closed fracture: Secondary | ICD-10-CM | POA: Diagnosis present

## 2018-07-18 DIAGNOSIS — R0902 Hypoxemia: Secondary | ICD-10-CM | POA: Diagnosis not present

## 2018-07-18 DIAGNOSIS — D649 Anemia, unspecified: Secondary | ICD-10-CM | POA: Diagnosis not present

## 2018-07-18 DIAGNOSIS — R42 Dizziness and giddiness: Secondary | ICD-10-CM | POA: Diagnosis not present

## 2018-07-18 DIAGNOSIS — R079 Chest pain, unspecified: Secondary | ICD-10-CM | POA: Diagnosis not present

## 2018-07-18 DIAGNOSIS — I959 Hypotension, unspecified: Secondary | ICD-10-CM | POA: Diagnosis not present

## 2018-07-18 HISTORY — DX: Fracture of superior rim of right pubis, initial encounter for closed fracture: S32.511A

## 2018-07-18 LAB — CBC WITH DIFFERENTIAL/PLATELET
Abs Immature Granulocytes: 0.08 10*3/uL — ABNORMAL HIGH (ref 0.00–0.07)
Basophils Absolute: 0 10*3/uL (ref 0.0–0.1)
Basophils Relative: 0 %
Eosinophils Absolute: 0 10*3/uL (ref 0.0–0.5)
Eosinophils Relative: 0 %
HCT: 25.5 % — ABNORMAL LOW (ref 36.0–46.0)
Hemoglobin: 8 g/dL — ABNORMAL LOW (ref 12.0–15.0)
Immature Granulocytes: 1 %
Lymphocytes Relative: 7 %
Lymphs Abs: 0.8 10*3/uL (ref 0.7–4.0)
MCH: 31.7 pg (ref 26.0–34.0)
MCHC: 31.4 g/dL (ref 30.0–36.0)
MCV: 101.2 fL — ABNORMAL HIGH (ref 80.0–100.0)
MONO ABS: 1.8 10*3/uL — AB (ref 0.1–1.0)
MONOS PCT: 16 %
Neutro Abs: 8.3 10*3/uL — ABNORMAL HIGH (ref 1.7–7.7)
Neutrophils Relative %: 76 %
Platelets: 173 10*3/uL (ref 150–400)
RBC: 2.52 MIL/uL — ABNORMAL LOW (ref 3.87–5.11)
RDW: 14.4 % (ref 11.5–15.5)
WBC: 11 10*3/uL — ABNORMAL HIGH (ref 4.0–10.5)
nRBC: 0 % (ref 0.0–0.2)

## 2018-07-18 MED ORDER — SODIUM CHLORIDE 0.9 % IV SOLN
1.0000 g | INTRAVENOUS | Status: DC
  Administered 2018-07-19 – 2018-07-20 (×3): 1 g via INTRAVENOUS
  Filled 2018-07-18 (×3): qty 1

## 2018-07-18 MED ORDER — VANCOMYCIN HCL IN DEXTROSE 500-5 MG/100ML-% IV SOLN
500.0000 mg | INTRAVENOUS | Status: DC
  Administered 2018-07-19: 500 mg via INTRAVENOUS
  Filled 2018-07-18 (×5): qty 100

## 2018-07-18 MED ORDER — VANCOMYCIN HCL IN DEXTROSE 1-5 GM/200ML-% IV SOLN: 1000.0000 mg | Freq: Once | INTRAVENOUS | Status: DC

## 2018-07-18 MED ORDER — METRONIDAZOLE IN NACL 5-0.79 MG/ML-% IV SOLN
500.0000 mg | Freq: Three times a day (TID) | INTRAVENOUS | Status: DC
  Administered 2018-07-18 – 2018-07-21 (×7): 500 mg via INTRAVENOUS
  Filled 2018-07-18 (×9): qty 100

## 2018-07-18 MED ORDER — VANCOMYCIN HCL 10 G IV SOLR
1250.0000 mg | Freq: Once | INTRAVENOUS | Status: AC
  Administered 2018-07-19: 1250 mg via INTRAVENOUS
  Filled 2018-07-18: qty 1250

## 2018-07-18 MED ORDER — SODIUM CHLORIDE 0.9 % IV SOLN: 2.0000 g | Freq: Once | INTRAVENOUS | Status: DC

## 2018-07-18 NOTE — ED Notes (Signed)
Patient placed on 2L via Nasal Cannula.

## 2018-07-18 NOTE — Progress Notes (Signed)
Pharmacy Antibiotic Note  Valerie Terrell is a 85 y.o. female admitted on 07/18/2018 with sepsis.  Pharmacy has been consulted for vancomycin/cefepime dosing. Tmax/24h 101.1. ESRD on HD TTS - tolerated full session on Sat pta.  Plan: Cefepime 1g IV q24h Vancomycin 1250mg  IV x1; then 500mg  IV qHD TTS Flagyl 500mg  IV q8h Monitor clinical progress, c/s, abx plan/LOT Pre-HD vancomycin level as indicated F/u HD schedule/tolerance inpatient for antibiotic maintenance doses   Height: 4\' 11"  (149.9 cm) Weight: 120 lb (54.4 kg) IBW/kg (Calculated) : 43.2  Temp (24hrs), Avg:101.1 F (38.4 C), Min:101.1 F (38.4 C), Max:101.1 F (38.4 C)  No results for input(s): WBC, CREATININE, LATICACIDVEN, VANCOTROUGH, VANCOPEAK, VANCORANDOM, GENTTROUGH, GENTPEAK, GENTRANDOM, TOBRATROUGH, TOBRAPEAK, TOBRARND, AMIKACINPEAK, AMIKACINTROU, AMIKACIN in the last 168 hours.  CrCl cannot be calculated (Patient's most recent lab result is older than the maximum 21 days allowed.).    Allergies  Allergen Reactions  . Clonidine Derivatives Palpitations and Other (See Comments)    VERY SEDATED  . Adhesive [Tape] Other (See Comments)    Tears skin up  . Codeine Nausea Only  . Nickel Rash  . Sulfa Antibiotics Other (See Comments)    Reaction unknown    Antimicrobials this admission: 3/2 vancomycin >>  3/2 cefepime >>  3/2 flagyl >>  Dose adjustments this admission:   Microbiology results:   Elicia Lamp, PharmD, BCPS Clinical Pharmacist 07/18/2018 10:20 PM

## 2018-07-18 NOTE — ED Provider Notes (Signed)
[facility] EMERGENCY DEPARTMENT Provider Note   CSN: [id number] Arrival date & time: 07/18/18  2202    History   Chief Complaint Chief Complaint  Patient presents with  . Fall    HPI Valerie Terrell is a 85 y.o. female.with a pmh of ESRD who presents via EMS for fall. Hx is given by the patient and her daughter. The patient states that she fell on Saturday. She was able to walk, however her daughter states that it was with significant difficulty.  She was able to get through her dialysis treatment on Saturday.  She dialyzes Tuesday Thursday Saturday.  Her daughter feels like her weight has been up this week.  She was very weak today and totally unable to ambulate at all today.  Her daughter was helping her change her adult diaper and noticed bruising on her abdomen.  Patient is complaining of severe pain in her perineum.  She states that she was feeding the cat when she fell over and onto her bottom.  She also has severe pain today in her sacrum.  She denies leg weakness. She is on coumadin.     HPI  Past Medical History:  Diagnosis Date  . Anemia of chronic disease 2018   + anemia of CRI?  [name] Atrial fibrillation ([place]) [I48.91] 12/11/2016  . Atrophy of left kidney    with absent blood flow by renal artery dopplers (Dr. Gwenlyn Found)  . 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 Chronic combined systolic and diastolic CHF (congestive heart failure) ([place])    a. 11/2016: echo showing EF of 35-40%, RV strain noted, mild MR and mild TR.   Echo showed much improved EF 12/2017 (55-60%)  . Chronic respiratory failure with hypoxia ([place]) 02/11/2017   Now on chronic O2 (intermittent as of summer 2019).    . Debilitated patient    WC dependent as of 2018  . ESRD on hemodialysis ([place]) 01/2017   T/Th/Sat schedule- [place].  Right basilic AV fistula 34/7425--ZDG unable to successfully access this.  Dr. Donnetta Hutching to do an upper arm AV graft as of 10/2017..  . History of  adenomatous polyp of colon   . History of kidney stones    passed  . History of subarachnoid hemorrhage 10/2014   after syncope and while on xarelto  . HOH (hard of hearing)   . Hyperlipidemia   . Hypertension    Difficult to control, in the setting of one functioning kidney: pt was referred to nephrology by Dr. Gwenlyn Found 06/2015.  . Lumbar radiculopathy 2012  . Lumbar spinal stenosis 2019   facet injections only very short term relief.  L1 and L2 selective nerve root blocks helpful 04/2018.  . Lumbar spondylosis    MR 07/2016---no sign of spinal nerve compression or cord compression.  Pt set up with outpt ortho while admitted to hosp 07/2016.  . Malnourished ([place])   . Metatarsal fracture 06/10/2016   Nondisplaced, left 5th metatarsal--pt was referred to ortho  . Nonischemic cardiomyopathy ([place])    EF improved to 55-60% on echo 12/2017  . Osteopenia 2014   T-score -2.1  . PAF (paroxysmal atrial fibrillation) (H[facility])    Eliquis started after BRAO and CVA.  She was changed to warfarin 2018--INR/coumadin mgmt per HD/nephrol.  . Presence of permanent cardiac pacemaker   . Right rib fracture 12/2016   s/p fall  . Sick sinus syndrome ([place]) 11/2016   Dual chamber pacer insertion 2018 (Dr. Lovena Le)  .  Stroke ([place]) 55/7322   cardioembolic (had CVA while on no anticoag)--"scattered subacute punctate infarcts: 1 in R parietal lobe and 2 in occipital cortex" on MRI br.  CT angio head/neck: aortic arch athero.  R ICA 20% stenosis, L ICA w/out any stenosis.  . Thoracic back pain 01/2017; 01/2018   2018: Facet?  Dr. Ramos->steroid injection.  01/2018-->CT T spine to check for a new comp fx-->none found.  Selective nerve root inj in T spine helpful on R side.   . Thoracic compression fracture ([place]) 07/2016   T3.  T7 and T8-- T8 kyphoplasty during hosp admission 07/2016.  Neuro referred pt to pain mgmt for consideration of injection 09/2016.  I referred her to endo 08/2016 for consideration of calcitonin treatment.      Patient Active Problem List   Diagnosis Date Noted  . ESRD (end stage renal disease) on dialysis ([place]) 05/20/2018  . Chronic back pain 05/14/2018  . Chronic pain syndrome 05/14/2018  . Hypoxia 12/14/2017  . Dyspnea 12/14/2017  . Degeneration of thoracic intervertebral disc 07/26/2017  . Acute respiratory failure ([place]) 02/13/2017  . Hyperkalemia 02/13/2017  . ESRD on dialysis ([place]) 02/11/2017  . Chronic respiratory failure with hypoxia ([place]) 02/11/2017  . Debilitated patient 02/07/2017  . Poor tolerance for ambulation 02/07/2017  . Fracture of one rib, right side, initial encounter for closed fracture 01/11/2017  . Closed fracture of one rib of right side   . Chronic combined systolic and diastolic heart failure ([place]) 01/05/2017  . Palpitations 12/15/2016  . Hypokalemia 12/14/2016  . Leukocytosis 12/14/2016  . Long term (current) use of anticoagulants [Z79.01] 12/11/2016  . Non-ischemic cardiomyopathy ([place]) 12/07/2016  . Acute combined systolic and diastolic heart failure ([place])  12/07/2016  . Cardiac pacemaker   . Acute on chronic respiratory failure with hypoxia ([place])   . Acute pulmonary edema with congestive heart failure ([place]) 11/30/2016  . Elevated troponin level 11/30/2016  . Anemia 11/30/2016  . Chronic midline thoracic back pain 09/15/2016  . History of CVA (cerebrovascular accident) 09/15/2016  . Hyperlipidemia   . Nausea & vomiting 08/05/2016  . Dehydration 08/05/2016  . Osteoporosis 07/28/2016  . Closed compression fracture of thoracic vertebra ([place]) 07/23/2016  . Thoracic compression fracture ([place]) 07/22/2016  . Flank pain 07/11/2016  . Hypoalbuminemia 07/11/2016  . Renal artery stenosis ([place]) 06/24/2016  . Nondisplaced fracture of fifth metatarsal bone, left foot, initial encounter for closed fracture 06/10/2016  . Rib contusion, left, initial encounter 06/10/2016  . Single kidney 03/13/2016  . Chest pain   . Dyslipidemia   . PAF (paroxysmal atrial  fibrillation) ([place]) 01/28/2016  . Essential hypertension 01/28/2016  . History of cardioembolic stroke 02/54/2706  . Personal history of subarachnoid hemorrhage 01/28/2016  . CKD (chronic kidney disease), stage IV ([place]) 01/28/2016  . Gait disturbance 01/27/2016  . Mitral valve disease 01/08/2014  . Colon polyps 07/10/2013  . Lumbar radiculopathy 02/04/2011    Past Surgical History:  Procedure Laterality Date  . APPENDECTOMY  child  . AV FISTULA PLACEMENT Right 03/31/2017   Procedure: Right ARM Basilic vein transposition;  Surgeon: Conrad Cedar Crest, MD;  Location: [facility] OR;  Service: Vascular;  Laterality: Right;  . AV FISTULA PLACEMENT Right 12/06/2017   Procedure: INSERTION OF ARTERIOVENOUS (AV) GORE-TEX GRAFT ARM RIGHT UPPER EXTREMITY;  Surgeon: [name], MD;  Location: [facility];  Service: Vascular;  Laterality: Right;  . CARDIOVASCULAR STRESS TEST  01/31/2016   Stress myoview: NORMAL/Low risk.  EF 56%.  . CESAREAN  SECTION  1963  . COLONOSCOPY  2015   + hx of adenomatous polyps.  Need digest health spec in [facility] records to see when pt due for next colonoscopy  . EYE SURGERY Bilateral    Catarct  . INSERTION OF DIALYSIS CATHETER Right 01/29/2017   Procedure: INSERTION OF DIALYSIS CATHETER- RIGHT INTERNAL JUGULAR;  Surgeon: [name], MD;  Location: [facility] OR;  Service: Vascular;  Laterality: Right;  . IR GENERIC HISTORICAL  07/24/2016   IR KYPHO THORACIC WITH BONE BIOPSY 07/24/2016 Luanne Bras, MD [facility]-INTERV RAD  . KYPHOPLASTY  07/27/2016   T8  . PACEMAKER IMPLANT N/A 12/03/2016   Procedure: Pacemaker Implant;  Surgeon: Evans Lance, MD;  Location: [facility] CV LAB;  Service: Cardiovascular;  Laterality: N/A;  . Renal artery dopplers  02/26/2016   Her right renal dimension was 11 cm pole to pole with mild to moderate right renal artery stenosis. A right renal aortic ratio was 3.22 suggesting less than a 50% stenosis.  . Right upper arm AV Gore-Tex graft  12/06/2017   Dr. Donnetta Hutching    . TONSILLECTOMY    . TRANSTHORACIC ECHOCARDIOGRAM  01/29/2016; 11/2016; 12/2017   2017: EF 55-60%, normal LV wall motion, grade I DD.  No cardiac source of emboli was seen.  2018: EF 35-40%, could not assess DD due to a-fib, pulm HTN noted. 12/2017 EF 55-60%, nl wall motion, grd II DD, mod MR and pulm regurg, increased pulm pressure (peak 52).     OB History   No obstetric history on file.      Home Medications    Prior to Admission medications   Medication Sig Start Date End Date Taking? Authorizing Provider  acetaminophen (TYLENOL) 500 MG tablet Take 500-1,000 mg 3 (three) times daily as needed by mouth for moderate pain.     [provider]  amiodarone (PACERONE) 200 MG tablet TAKE 1 TABLET BY MOUTH  DAILY Patient taking differently: Take 200 mg by mouth daily.  04/11/18   Lorretta Harp, MD  carvedilol (COREG) 3.125 MG tablet TAKE 1 TABLET BY MOUTH TWO  TIMES DAILY 06/27/18   Lorretta Harp, MD  HYDROcodone-acetaminophen (NORCO) 5-325 MG tablet Take 1-2 tablets by mouth every 6 (six) hours as needed for moderate pain or severe pain. 06/03/18   McGowen, Adrian Blackwater, MD  isosorbide mononitrate (IMDUR) 60 MG 24 hr tablet TAKE 1 TABLET BY MOUTH  DAILY Patient taking differently: Take 60 mg by mouth daily.  03/09/18   McGowen, Adrian Blackwater, MD  ondansetron (ZOFRAN) 4 MG tablet Take 1 tablet (4 mg total) by mouth every 8 (eight) hours as needed for nausea or vomiting. 06/03/18   McGowen, Adrian Blackwater, MD  oxyCODONE (OXY IR/ROXICODONE) 5 MG immediate release tablet 1-2 tabs po q6h prn severe pain 07/06/18   McGowen, Adrian Blackwater, MD  polyvinyl alcohol (LIQUIFILM TEARS) 1.4 % ophthalmic solution Place 1-2 drops into both eyes as needed for dry eyes.    [provider]  pravastatin (PRAVACHOL) 40 MG tablet TAKE 1 TABLET BY MOUTH  EVERY EVENING Patient taking differently: Take 40 mg by mouth daily.  03/09/18   McGowen, Adrian Blackwater, MD  sevelamer carbonate (RENVELA) 800 MG tablet Take 800 mg by  mouth 3 (three) times daily with meals.     [provider]  vitamin C (ASCORBIC ACID) 500 MG tablet Take 500 mg by mouth daily.    [provider]  Vitamin D, Ergocalciferol, (DRISDOL) 1.25 MG (50000 UT)  CAPS capsule Take 1 capsule (50,000 Units total) by mouth every Thursday. 04/28/18   McGowen, Adrian Blackwater, MD  warfarin (COUMADIN) 3 MG tablet TAKE 1/2 TO 1 TABLET BY  MOUTH DAILY AS DIRECTED BY  COUMADIN CLINIC Patient taking differently: Take 1.5-3 mg by mouth See admin instructions. Per MD as of 05/26/2018. Hold on 01/09 and 01/10. Take 1.5mg  01/11, 01/12, and 01/13. Will re-check levels on 01/13 and determine new directions. 12/24/17   Lorretta Harp, MD    Family History Family History  Problem Relation Age of Onset  . Cancer Mother   . Heart disease Father   . Early death Father   . Sudden Cardiac Death Neg Hx     Social History Social History   Tobacco Use  . Smoking status: Former Smoker    Years: 22.00    Last attempt to quit: 03/17/1972    Years since quitting: 46.3  . Smokeless tobacco: Never Used  Substance Use Topics  . Alcohol use: Yes    Alcohol/week: 1.0 standard drinks    Types: 1 Glasses of wine per week    Comment: wine  . Drug use: No     Allergies   Clonidine derivatives; Adhesive [tape]; Codeine; Nickel; and Sulfa antibiotics   Review of Systems Review of Systems Ten systems reviewed and are negative for acute change, except as noted in the HPI.   Physical Exam Updated Vital Signs There were no vitals taken for this visit.  Physical Exam Vitals signs and nursing note reviewed.  Constitutional:      General: She is not in acute distress.    Appearance: She is well-developed. She is not diaphoretic.  HENT:     Head: Normocephalic and atraumatic.  Eyes:     General: No scleral icterus.    Conjunctiva/sclera: Conjunctivae normal.  Neck:     Musculoskeletal: Normal range of motion.  Cardiovascular:     Rate and Rhythm:  Normal rate and regular rhythm.     Heart sounds: Normal heart sounds. No murmur. No friction rub. No gallop.   Pulmonary:     Effort: Pulmonary effort is normal. No respiratory distress.     Breath sounds: Normal breath sounds.  Abdominal:     General: Bowel sounds are normal. There is no distension.     Palpations: Abdomen is soft. There is no mass.     Tenderness: There is no abdominal tenderness. There is no guarding.  Musculoskeletal:     Comments: Numb, gluteus and sacrum.  Doing over the lower right abdomen, vulva.Exquisitely tender to palpation  Skin:    General: Skin is warm and dry.  Neurological:     Mental Status: She is alert and oriented to person, place, and time.  Psychiatric:        Behavior: Behavior normal.      ED Treatments / Results  Labs (all labs ordered are listed, but only abnormal results are displayed) Labs Reviewed - No data to display  EKG None  Radiology No results found.  Procedures .Critical Care Performed by: Margarita Mail, PA-C Authorized by: Margarita Mail, PA-C   Critical care provider statement:    Critical care time (minutes):  45   Critical care was necessary to treat or prevent imminent or life-threatening deterioration of the following conditions:  Trauma and sepsis   Critical care was time spent personally by me on the following activities:  Discussions with consultants, evaluation of patient's response to treatment, examination of patient, ordering  and performing treatments and interventions, ordering and review of laboratory studies, ordering and review of radiographic studies, pulse oximetry, re-evaluation of patient's condition, obtaining history from patient or surrogate and review of old charts   (including critical care time)  Medications Ordered in ED Medications - No data to display   Initial Impression / Assessment and Plan / ED Course  I have reviewed the triage vital signs and the nursing notes.  Pertinent  labs & imaging results that were available during my care of the patient were reviewed by me and considered in my medical decision making (see chart for details).  Clinical Course as of Jul 18 24  Mon Jul 18, 2018  2226 Patient with end-stage renal disease here after a fall on Saturday landing on her butt.  She said she is had a lot of pain since then but today she was more lightheaded and confused.  She is found to have a fever 101 here for low blood pressure.  She is getting sepsis labs and some imaging.  She will need to be admitted to the hospital.   [MB]  2310 Temp(!): 101.1 F (38.4 C) [AH]  2310 BP(!): 100/58 [AH]  2310 SpO2(!): 88 % [AH]  2310 X-ray is negative for acute evidence of pneumonia or volume overload.  DG Chest Port 1 View [AH]  2310 Pelvic x-ray with suspicion for likely pelvic fracture which would correlate patient's clinical presentation.  DG Pelvis Portable [AH]  2310 Patient febrile.  Unknown source.  I have initiated sepsis protocol.   [AH]  Tue Jul 19, 2018  0002 CT PELVIS WO CONTRAST [AH]    Clinical Course User Index [AH] Margarita Mail, PA-C [MB] Hayden Rasmussen, MD       Patient here with pelvic fracture.  She has some stranding around the hematoma in her pelvis anterior to the sacrum.  This may be the source of her infection.  I doubt that she will need surgical intervention at this point but will likely need antibiotics.  White blood cell count slightly elevated at 11.  Hemoglobin has dropped from 11.2 to 8.  I have ordered a type and screen.  I doubt she is actively extravasating in the pelvis.  Patient will need admission.  I given signout to [place].  Final Clinical Impressions(s) / ED Diagnoses   Final diagnoses:  None    ED Discharge Orders    None       Margarita Mail, PA-C 07/19/18 0030    Hayden Rasmussen, MD 07/19/18 [phone]

## 2018-07-18 NOTE — ED Notes (Signed)
Asked patient for urine specimen, reports anuria. MD made aware.

## 2018-07-18 NOTE — ED Notes (Signed)
Also takes oxycodone around the clock, is reporting "severe" pain currently.

## 2018-07-18 NOTE — ED Triage Notes (Signed)
BIB EMS from home. Pt reports R hip pain r/t fall on Saturday, no deformity noted. Bruising is present. Pt noted to be febrile with EMS. TThS dialysis, did have full treatment on Saturday. Pt was given 1000mg  Tylenol en route.

## 2018-07-18 NOTE — ED Notes (Signed)
RN sent 1 visitor to back

## 2018-07-18 NOTE — ED Notes (Signed)
Patient transported to CT 

## 2018-07-19 ENCOUNTER — Encounter (HOSPITAL_COMMUNITY): Payer: Self-pay

## 2018-07-19 ENCOUNTER — Other Ambulatory Visit: Payer: Self-pay

## 2018-07-19 DIAGNOSIS — S3282XA Multiple fractures of pelvis without disruption of pelvic ring, initial encounter for closed fracture: Secondary | ICD-10-CM

## 2018-07-19 DIAGNOSIS — M8580 Other specified disorders of bone density and structure, unspecified site: Secondary | ICD-10-CM | POA: Diagnosis present

## 2018-07-19 DIAGNOSIS — S3023XD Contusion of vagina and vulva, subsequent encounter: Secondary | ICD-10-CM | POA: Diagnosis not present

## 2018-07-19 DIAGNOSIS — S300XXA Contusion of lower back and pelvis, initial encounter: Secondary | ICD-10-CM | POA: Diagnosis present

## 2018-07-19 DIAGNOSIS — Z992 Dependence on renal dialysis: Secondary | ICD-10-CM

## 2018-07-19 DIAGNOSIS — S32811A Multiple fractures of pelvis with unstable disruption of pelvic ring, initial encounter for closed fracture: Secondary | ICD-10-CM | POA: Diagnosis not present

## 2018-07-19 DIAGNOSIS — Z8673 Personal history of transient ischemic attack (TIA), and cerebral infarction without residual deficits: Secondary | ICD-10-CM | POA: Diagnosis not present

## 2018-07-19 DIAGNOSIS — S32591A Other specified fracture of right pubis, initial encounter for closed fracture: Principal | ICD-10-CM

## 2018-07-19 DIAGNOSIS — Y92009 Unspecified place in unspecified non-institutional (private) residence as the place of occurrence of the external cause: Secondary | ICD-10-CM | POA: Diagnosis not present

## 2018-07-19 DIAGNOSIS — E785 Hyperlipidemia, unspecified: Secondary | ICD-10-CM | POA: Diagnosis present

## 2018-07-19 DIAGNOSIS — R509 Fever, unspecified: Secondary | ICD-10-CM | POA: Diagnosis present

## 2018-07-19 DIAGNOSIS — N186 End stage renal disease: Secondary | ICD-10-CM

## 2018-07-19 DIAGNOSIS — J9621 Acute and chronic respiratory failure with hypoxia: Secondary | ICD-10-CM | POA: Diagnosis not present

## 2018-07-19 DIAGNOSIS — I10 Essential (primary) hypertension: Secondary | ICD-10-CM | POA: Diagnosis not present

## 2018-07-19 DIAGNOSIS — I132 Hypertensive heart and chronic kidney disease with heart failure and with stage 5 chronic kidney disease, or end stage renal disease: Secondary | ICD-10-CM | POA: Diagnosis present

## 2018-07-19 DIAGNOSIS — Z950 Presence of cardiac pacemaker: Secondary | ICD-10-CM | POA: Diagnosis not present

## 2018-07-19 DIAGNOSIS — Z7901 Long term (current) use of anticoagulants: Secondary | ICD-10-CM

## 2018-07-19 DIAGNOSIS — I480 Paroxysmal atrial fibrillation: Secondary | ICD-10-CM

## 2018-07-19 DIAGNOSIS — R5381 Other malaise: Secondary | ICD-10-CM | POA: Diagnosis not present

## 2018-07-19 DIAGNOSIS — R2689 Other abnormalities of gait and mobility: Secondary | ICD-10-CM | POA: Diagnosis not present

## 2018-07-19 DIAGNOSIS — Z9181 History of falling: Secondary | ICD-10-CM | POA: Diagnosis not present

## 2018-07-19 DIAGNOSIS — S3210XA Unspecified fracture of sacrum, initial encounter for closed fracture: Secondary | ICD-10-CM

## 2018-07-19 DIAGNOSIS — S3023XA Contusion of vagina and vulva, initial encounter: Secondary | ICD-10-CM

## 2018-07-19 DIAGNOSIS — I120 Hypertensive chronic kidney disease with stage 5 chronic kidney disease or end stage renal disease: Secondary | ICD-10-CM | POA: Diagnosis not present

## 2018-07-19 DIAGNOSIS — S32599A Other specified fracture of unspecified pubis, initial encounter for closed fracture: Secondary | ICD-10-CM | POA: Diagnosis present

## 2018-07-19 DIAGNOSIS — Z789 Other specified health status: Secondary | ICD-10-CM | POA: Diagnosis not present

## 2018-07-19 DIAGNOSIS — Z87891 Personal history of nicotine dependence: Secondary | ICD-10-CM | POA: Diagnosis not present

## 2018-07-19 DIAGNOSIS — W010XXA Fall on same level from slipping, tripping and stumbling without subsequent striking against object, initial encounter: Secondary | ICD-10-CM | POA: Diagnosis present

## 2018-07-19 DIAGNOSIS — D631 Anemia in chronic kidney disease: Secondary | ICD-10-CM | POA: Diagnosis not present

## 2018-07-19 DIAGNOSIS — D62 Acute posthemorrhagic anemia: Secondary | ICD-10-CM

## 2018-07-19 DIAGNOSIS — J9601 Acute respiratory failure with hypoxia: Secondary | ICD-10-CM | POA: Diagnosis not present

## 2018-07-19 DIAGNOSIS — N2581 Secondary hyperparathyroidism of renal origin: Secondary | ICD-10-CM | POA: Diagnosis not present

## 2018-07-19 DIAGNOSIS — Z8249 Family history of ischemic heart disease and other diseases of the circulatory system: Secondary | ICD-10-CM | POA: Diagnosis not present

## 2018-07-19 LAB — BASIC METABOLIC PANEL
Anion gap: 16 — ABNORMAL HIGH (ref 5–15)
BUN: 34 mg/dL — ABNORMAL HIGH (ref 8–23)
CO2: 27 mmol/L (ref 22–32)
Calcium: 8.3 mg/dL — ABNORMAL LOW (ref 8.9–10.3)
Chloride: 94 mmol/L — ABNORMAL LOW (ref 98–111)
Creatinine, Ser: 5.79 mg/dL — ABNORMAL HIGH (ref 0.44–1.00)
GFR calc non Af Amer: 6 mL/min — ABNORMAL LOW (ref >60)
GFR, EST AFRICAN AMERICAN: 7 mL/min — AB (ref >60)
Glucose, Bld: 106 mg/dL — ABNORMAL HIGH (ref 70–99)
Potassium: 4.8 mmol/L (ref 3.5–5.1)
SODIUM: 137 mmol/L (ref 135–145)

## 2018-07-19 LAB — COMPREHENSIVE METABOLIC PANEL
ALT: 9 U/L (ref 0–44)
AST: 17 U/L (ref 15–41)
Albumin: 2.6 g/dL — ABNORMAL LOW (ref 3.5–5.0)
Alkaline Phosphatase: 59 U/L (ref 38–126)
Anion gap: 14 (ref 5–15)
BUN: 31 mg/dL — ABNORMAL HIGH (ref 8–23)
CO2: 29 mmol/L (ref 22–32)
Calcium: 8.3 mg/dL — ABNORMAL LOW (ref 8.9–10.3)
Chloride: 91 mmol/L — ABNORMAL LOW (ref 98–111)
Creatinine, Ser: 5.64 mg/dL — ABNORMAL HIGH (ref 0.44–1.00)
GFR calc non Af Amer: 6 mL/min — ABNORMAL LOW (ref >60)
GFR, EST AFRICAN AMERICAN: 7 mL/min — AB (ref >60)
Glucose, Bld: 108 mg/dL — ABNORMAL HIGH (ref 70–99)
Potassium: 5.3 mmol/L — ABNORMAL HIGH (ref 3.5–5.1)
Sodium: 134 mmol/L — ABNORMAL LOW (ref 135–145)
Total Bilirubin: 0.5 mg/dL (ref 0.3–1.2)
Total Protein: 5.8 g/dL — ABNORMAL LOW (ref 6.5–8.1)

## 2018-07-19 LAB — HEMOGLOBIN AND HEMATOCRIT, BLOOD
HEMATOCRIT: 36 % (ref 36.0–46.0)
Hemoglobin: 11.7 g/dL — ABNORMAL LOW (ref 12.0–15.0)

## 2018-07-19 LAB — ABO/RH: ABO/RH(D): B POS

## 2018-07-19 LAB — CBC
HCT: 24.7 % — ABNORMAL LOW (ref 36.0–46.0)
Hemoglobin: 7.9 g/dL — ABNORMAL LOW (ref 12.0–15.0)
MCH: 32.1 pg (ref 26.0–34.0)
MCHC: 32 g/dL (ref 30.0–36.0)
MCV: 100.4 fL — ABNORMAL HIGH (ref 80.0–100.0)
PLATELETS: 167 10*3/uL (ref 150–400)
RBC: 2.46 MIL/uL — ABNORMAL LOW (ref 3.87–5.11)
RDW: 14.5 % (ref 11.5–15.5)
WBC: 10.9 10*3/uL — AB (ref 4.0–10.5)
nRBC: 0 % (ref 0.0–0.2)

## 2018-07-19 LAB — INFLUENZA PANEL BY PCR (TYPE A & B)
Influenza A By PCR: NEGATIVE
Influenza B By PCR: NEGATIVE

## 2018-07-19 LAB — LACTIC ACID, PLASMA: Lactic Acid, Venous: 1.9 mmol/L (ref 0.5–1.9)

## 2018-07-19 LAB — PROTIME-INR
INR: 2.8 — ABNORMAL HIGH (ref 0.8–1.2)
Prothrombin Time: 29 seconds — ABNORMAL HIGH (ref 11.4–15.2)

## 2018-07-19 LAB — PREPARE RBC (CROSSMATCH)

## 2018-07-19 MED ORDER — ACETAMINOPHEN 650 MG RE SUPP: 650.0000 mg | Freq: Four times a day (QID) | RECTAL | Status: DC | PRN

## 2018-07-19 MED ORDER — CHLORHEXIDINE GLUCONATE CLOTH 2 % EX PADS
6.0000 | MEDICATED_PAD | Freq: Every day | CUTANEOUS | Status: DC
  Administered 2018-07-21: 6 via TOPICAL

## 2018-07-19 MED ORDER — ISOSORBIDE MONONITRATE ER 60 MG PO TB24
60.0000 mg | ORAL_TABLET | Freq: Every day | ORAL | Status: DC
  Administered 2018-07-20 – 2018-07-22 (×3): 60 mg via ORAL
  Filled 2018-07-19 (×3): qty 1

## 2018-07-19 MED ORDER — SODIUM CHLORIDE 0.9% IV SOLUTION
Freq: Once | INTRAVENOUS | Status: AC
  Administered 2018-07-20: 23:00:00 via INTRAVENOUS

## 2018-07-19 MED ORDER — POLYVINYL ALCOHOL 1.4 % OP SOLN: 1.0000 [drp] | OPHTHALMIC | Status: DC | PRN

## 2018-07-19 MED ORDER — CARVEDILOL 3.125 MG PO TABS
3.1250 mg | ORAL_TABLET | Freq: Two times a day (BID) | ORAL | Status: DC | PRN
  Filled 2018-07-19: qty 1

## 2018-07-19 MED ORDER — HYDROCODONE-ACETAMINOPHEN 5-325 MG PO TABS
1.0000 | ORAL_TABLET | Freq: Four times a day (QID) | ORAL | Status: DC | PRN
  Administered 2018-07-19: 2 via ORAL
  Administered 2018-07-21: 1 via ORAL
  Filled 2018-07-19 (×3): qty 1

## 2018-07-19 MED ORDER — ONDANSETRON HCL 4 MG PO TABS: 4.0000 mg | ORAL_TABLET | Freq: Four times a day (QID) | ORAL | Status: DC | PRN

## 2018-07-19 MED ORDER — ACETAMINOPHEN 325 MG PO TABS
650.0000 mg | ORAL_TABLET | Freq: Four times a day (QID) | ORAL | Status: DC | PRN
  Administered 2018-07-20 – 2018-07-21 (×4): 650 mg via ORAL
  Filled 2018-07-19 (×3): qty 2

## 2018-07-19 MED ORDER — ONDANSETRON HCL 4 MG/2ML IJ SOLN: 4.0000 mg | Freq: Four times a day (QID) | INTRAMUSCULAR | Status: DC | PRN

## 2018-07-19 MED ORDER — ONDANSETRON HCL 4 MG PO TABS: 4.0000 mg | ORAL_TABLET | Freq: Three times a day (TID) | ORAL | Status: DC | PRN

## 2018-07-19 MED ORDER — AMIODARONE HCL 100 MG PO TABS
200.0000 mg | ORAL_TABLET | Freq: Every day | ORAL | Status: DC
  Administered 2018-07-20 – 2018-07-22 (×3): 200 mg via ORAL
  Filled 2018-07-19 (×4): qty 2

## 2018-07-19 MED ORDER — SEVELAMER CARBONATE 800 MG PO TABS
800.0000 mg | ORAL_TABLET | Freq: Three times a day (TID) | ORAL | Status: DC
  Administered 2018-07-19 – 2018-07-22 (×10): 800 mg via ORAL
  Filled 2018-07-19 (×10): qty 1

## 2018-07-19 MED ORDER — PRAVASTATIN SODIUM 40 MG PO TABS
40.0000 mg | ORAL_TABLET | Freq: Every day | ORAL | Status: DC
  Administered 2018-07-19 – 2018-07-22 (×4): 40 mg via ORAL
  Filled 2018-07-19 (×4): qty 1

## 2018-07-19 MED ORDER — VITAMIN K1 10 MG/ML IJ SOLN
5.0000 mg | Freq: Once | INTRAVENOUS | Status: AC
  Administered 2018-07-19: 5 mg via INTRAVENOUS
  Filled 2018-07-19: qty 0.5

## 2018-07-19 NOTE — Plan of Care (Signed)
Problem: Education: Goal: Knowledge of General Education information will improve Description Including pain rating scale, medication(s)/side effects and non-pharmacologic comfort measures Outcome: Progressing   Problem: Health Behavior/Discharge Planning: Goal: Ability to manage health-related needs will improve Outcome: Progressing   Problem: Clinical Measurements: Goal: Respiratory complications will improve Outcome: Progressing   Problem: Nutrition: Goal: Adequate nutrition will be maintained Outcome: Progressing   Problem: Coping: Goal: Level of anxiety will decrease Outcome: Progressing   Problem: Pain Managment: Goal: General experience of comfort will improve Outcome: Progressing   Problem: Safety: Goal: Ability to remain free from injury will improve Outcome: Progressing   Problem: Skin Integrity: Goal: Risk for impaired skin integrity will decrease Outcome: Progressing   Problem: Education: Goal: Knowledge of General Education information will improve Description Including pain rating scale, medication(s)/side effects and non-pharmacologic comfort measures Outcome: Progressing

## 2018-07-19 NOTE — ED Notes (Addendum)
ED TO INPATIENT HANDOFF REPORT  ED Nurse Name and Phone #:  Joellen Jersey [phone]  S Name/Age/Gender Valerie Terrell 85 y.o. female Room/Bed: 027C/027C  Code Status   Code Status: Full Code  Home/SNF/Other Home Patient oriented to: self, place, time and situation Is this baseline? Yes   Triage Complete: Triage complete  Chief Complaint leg pain  Triage Note BIB EMS from home. Pt reports R hip pain r/t fall on Saturday, no deformity noted. Bruising is present. Pt noted to be febrile with EMS. TThS dialysis, did have full treatment on Saturday. Pt was given 1000mg  Tylenol en route.    Allergies Allergies  Allergen Reactions  . Clonidine Derivatives Palpitations and Other (See Comments)    VERY SEDATED  . Adhesive [Tape] Other (See Comments)    Tears skin up  . Codeine Nausea Only  . Nickel Rash  . Sulfa Antibiotics Other (See Comments)    Reaction unknown    Level of Care/Admitting Diagnosis ED Disposition    ED Disposition Condition Comment   Admit  Hospital Area: [facility] [[id number]]  Level of Care: Med-Surg [16]  I expect the patient will be discharged within 24 hours: No (not a candidate for 5C-Observation unit)  Diagnosis: Fracture of pubic ramus [facility]) [295284]  Admitting Physician: Etta Quill [phone]  Attending Physician: Etta Quill [[id number]]  PT Class (Do Not Modify): Observation [104]  PT Acc Code (Do Not Modify): Observation [10022]       B Medical/Surgery History Past Medical History:  Diagnosis Date  . Anemia of chronic disease 2018   + anemia of CRI?  [name] Atrial fibrillation ([place]) [I48.91] 12/11/2016  . Atrophy of left kidney    with absent blood flow by renal artery dopplers (Dr. Gwenlyn Found)  . 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 Chronic combined systolic and diastolic CHF (congestive heart failure) ([place])    a. 11/2016: echo showing EF of 35-40%, RV strain noted, mild MR and mild TR.   Echo showed much improved EF  12/2017 (55-60%)  . Chronic respiratory failure with hypoxia ([place]) 02/11/2017   Now on chronic O2 (intermittent as of summer 2019).    . Debilitated patient    WC dependent as of 2018  . ESRD on hemodialysis ([place]) 01/2017   T/Th/Sat schedule- [place].  Right basilic AV fistula 40/1027--OZD unable to successfully access this.  Dr. Donnetta Hutching to do an upper arm AV graft as of 10/2017..  . History of adenomatous polyp of colon   . History of kidney stones    passed  . History of subarachnoid hemorrhage 10/2014   after syncope and while on xarelto  . HOH (hard of hearing)   . Hyperlipidemia   . Hypertension    Difficult to control, in the setting of one functioning kidney: pt was referred to nephrology by Dr. Gwenlyn Found 06/2015.  . Lumbar radiculopathy 2012  . Lumbar spinal stenosis 2019   facet injections only very short term relief.  L1 and L2 selective nerve root blocks helpful 04/2018.  . Lumbar spondylosis    MR 07/2016---no sign of spinal nerve compression or cord compression.  Pt set up with outpt ortho while admitted to hosp 07/2016.  . Malnourished ([place])   . Metatarsal fracture 06/10/2016   Nondisplaced, left 5th metatarsal--pt was referred to ortho  . Nonischemic cardiomyopathy ([place])    EF improved to 55-60% on echo 12/2017  . Osteopenia 2014   T-score -2.1  . PAF (  paroxysmal atrial fibrillation) (H[facility])    Eliquis started after BRAO and CVA.  She was changed to warfarin 2018--INR/coumadin mgmt per HD/nephrol.  . Presence of permanent cardiac pacemaker   . Right rib fracture 12/2016   s/p fall  . Sick sinus syndrome ([place]) 11/2016   Dual chamber pacer insertion 2018 (Dr. Lovena Le)  . Stroke ([place]) 65/5374   cardioembolic (had CVA while on no anticoag)--"scattered subacute punctate infarcts: 1 in R parietal lobe and 2 in occipital cortex" on MRI br.  CT angio head/neck: aortic arch athero.  R ICA 20% stenosis, L ICA w/out any stenosis.  . Thoracic back pain 01/2017; 01/2018   2018: Facet?   Dr. Ramos->steroid injection.  01/2018-->CT T spine to check for a new comp fx-->none found.  Selective nerve root inj in T spine helpful on R side.   . Thoracic compression fracture ([place]) 07/2016   T3.  T7 and T8-- T8 kyphoplasty during hosp admission 07/2016.  Neuro referred pt to pain mgmt for consideration of injection 09/2016.  I referred her to endo 08/2016 for consideration of calcitonin treatment.   Past Surgical History:  Procedure Laterality Date  . APPENDECTOMY  child  . AV FISTULA PLACEMENT Right 03/31/2017   Procedure: Right ARM Basilic vein transposition;  Surgeon: Conrad Clifford, MD;  Location[facility] OR;  Service: Vascular;  Laterality: Right;  . AV FISTULA PLACEMENT Right 12/06/2017   Procedure: INSERTION OF ARTERIOVENOUS (AV) GORE-TEX GRAFT ARM RIGHT UPPER EXTREMITY;  Surgeon: [name], MD;  Location: [place];  Service: Vascular;  Laterality: Right;  . CARDIOVASCULAR STRESS TEST  01/31/2016   Stress myoview: NORMAL/Low risk.  EF 56%.  . [place]  . COLONOSCOPY  2015   + hx of adenomatous polyps.  Need digest health spec in [facility] records to see when pt due for next colonoscopy  . EYE SURGERY Bilateral    Catarct  . INSERTION OF DIALYSIS CATHETER Right 01/29/2017   Procedure: INSERTION OF DIALYSIS CATHETER- RIGHT INTERNAL JUGULAR;  Surgeon: [name], MD;  Location: [facility] OR;  Service: Vascular;  Laterality: Right;  . IR GENERIC HISTORICAL  07/24/2016   IR KYPHO THORACIC WITH BONE BIOPSY 07/24/2016 Luanne Bras, MD [facility]-INTERV RAD  . KYPHOPLASTY  07/27/2016   T8  . PACEMAKER IMPLANT N/A 12/03/2016   Procedure: Pacemaker Implant;  Surgeon: Evans Lance, MD;  Location: [place] CV LAB;  Service: Cardiovascular;  Laterality: N/A;  . Renal artery dopplers  02/26/2016   Her right renal dimension was 11 cm pole to pole with mild to moderate right renal artery stenosis. A right renal aortic ratio was 3.22 suggesting less than a 50% stenosis.  . Right upper arm AV  Gore-Tex graft  12/06/2017   Dr. Donnetta Hutching  . TONSILLECTOMY    . TRANSTHORACIC ECHOCARDIOGRAM  01/29/2016; 11/2016; 12/2017   2017: EF 55-60%, normal LV wall motion, grade I DD.  No cardiac source of emboli was seen.  2018: EF 35-40%, could not assess DD due to a-fib, pulm HTN noted. 12/2017 EF 55-60%, nl wall motion, grd II DD, mod MR and pulm regurg, increased pulm pressure (peak 52).     A IV Location/Drains/Wounds Patient Lines/Drains/Airways Status   Active Line/Drains/Airways    Name:   Placement date:   Placement time:   Site:   Days:   Peripheral IV 07/18/18 Left Hand   07/18/18    2211    Hand   1   Peripheral IV 07/18/18  Left Forearm   07/18/18    2316    Forearm   1   Fistula / Graft Right Upper arm Arteriovenous vein graft   12/06/17    0828    Upper arm   225   Hemodialysis Catheter Right Internal jugular   -    -    Internal jugular             Intake/Output Last 24 hours No intake or output data in the 24 hours ending 07/19/18 4680  Labs/Imaging Results for orders placed or performed during the hospital encounter of 07/18/18 (from the past 48 hour(s))  Lactic acid, plasma     Status: None   Collection Time: 07/18/18 10:35 PM  Result Value Ref Range   Lactic Acid, Venous 1.9 0.5 - 1.9 mmol/L    Comment: Performed at [facility], [address] [place], [place] [address]  Comprehensive metabolic panel     Status: Abnormal   Collection Time: 07/18/18 10:36 PM  Result Value Ref Range   Sodium 134 (L) 135 - 145 mmol/L   Potassium 5.3 (H) 3.5 - 5.1 mmol/L   Chloride 91 (L) 98 - 111 mmol/L   CO2 29 22 - 32 mmol/L   Glucose, Bld 108 (H) 70 - 99 mg/dL   BUN 31 (H) 8 - 23 mg/dL   Creatinine, Ser 5.64 (H) 0.44 - 1.00 mg/dL   Calcium 8.3 (L) 8.9 - 10.3 mg/dL   Total Protein 5.8 (L) 6.5 - 8.1 g/dL   Albumin 2.6 (L) 3.5 - 5.0 g/dL   AST 17 15 - 41 U/L   ALT 9 0 - 44 U/L   Alkaline Phosphatase 59 38 - 126 U/L   Total Bilirubin 0.5 0.3 - 1.2 mg/dL   GFR calc non Af  Amer 6 (L) >60 mL/min   GFR calc Af Amer 7 (L) >60 mL/min   Anion gap 14 5 - 15    Comment: Performed at [facility], [place] [address], [place], [place] [address]  CBC WITH DIFFERENTIAL     Status: Abnormal   Collection Time: 07/18/18 10:36 PM  Result Value Ref Range   WBC 11.0 (H) 4.0 - 10.5 K/uL   RBC 2.52 (L) 3.87 - 5.11 MIL/uL   Hemoglobin 8.0 (L) 12.0 - 15.0 g/dL   HCT 25.5 (L) 36.0 - 46.0 %   MCV 101.2 (H) 80.0 - 100.0 fL   MCH 31.7 26.0 - 34.0 pg   MCHC 31.4 30.0 - 36.0 g/dL   RDW 14.4 11.5 - 15.5 %   Platelets 173 150 - 400 K/uL   nRBC 0.0 0.0 - 0.2 %   Neutrophils Relative % 76 %   Neutro Abs 8.3 (H) 1.7 - 7.7 K/uL   Lymphocytes Relative 7 %   Lymphs Abs 0.8 0.7 - 4.0 K/uL   Monocytes Relative 16 %   Monocytes Absolute 1.8 (H) 0.1 - 1.0 K/uL   Eosinophils Relative 0 %   Eosinophils Absolute 0.0 0.0 - 0.5 K/uL   Basophils Relative 0 %   Basophils Absolute 0.0 0.0 - 0.1 K/uL   Immature Granulocytes 1 %   Abs Immature Granulocytes 0.08 (H) 0.00 - 0.07 K/uL    Comment: Performed at [facility], [address] [place], [place] [address]  Protime-INR     Status: Abnormal   Collection Time: 07/18/18 10:36 PM  Result Value Ref Range   Prothrombin Time 29.0 (H) 11.4 - 15.2  seconds   INR 2.8 (H) 0.8 - 1.2    Comment: (NOTE) INR goal varies based on device and disease states. Performed at [facility], [place] [address] [place], [place] [address]    Ct Pelvis Wo Contrast  Result Date: 07/18/2018 CLINICAL DATA:  Right hip pain. Fall Saturday with bruising and swelling. Pelvic and lower abdominal trauma, lower urinary tract trauma suspected. EXAM: CT PELVIS WITHOUT CONTRAST TECHNIQUE: Multidetector CT imaging of the pelvis was performed following the standard protocol without intravenous contrast. COMPARISON:  Pelvic radiograph earlier this day. Abdominopelvic CT 05/27/2018 FINDINGS: Urinary Tract: Urinary bladder partially distended. No bladder wall thickening.  Distal ureters are decompressed. Bowel: Colonic diverticulosis. No acute bowel abnormality of pelvic bowel loops. Vascular/Lymphatic: Dense vascular calcification of pelvic vasculature. No adenopathy. Reproductive:  No adnexal mass.  Atrophic uterus, normal for age. Other: Subcutaneous heterogeneous high density lesion in the right perineum measuring 6.9 x 5.1 x 5.8 cm with surrounding stranding, most consistent with hematoma. There is no free fluid in the pelvis. Musculoskeletal: Acute nondisplaced right superior pubic ramus fracture, image 51 series 6. Suspected nondisplaced right sacral fracture with presacral edema/hematoma, fracture line not well characterized given osteopenia. Interval callus formation/healing about the known left greater trochanteric fracture from prior CT. Suspect nondisplaced left parasymphyseal pubic body fracture, age indeterminate. Subcutaneous stranding about the right posterior upper thigh and right lateral hip. Mild stranding about the lower anterior abdomen. IMPRESSION: 1. Acute nondisplaced right superior pubic ramus fracture. Suspected nondisplaced right sacral fracture with fracture not well visualized, however there is presacral edema/hematoma. 2. Large 6.9 cm right perineal/inferior gluteal subcutaneous hematoma with associated stranding of the adjacent subcutaneous tissues. Additional stranding/edema of the lower anterior abdominal wall and lateral right hip. 3. Healing left greater trochanteric fracture. Suspected nondisplaced left parasymphyseal pubic body fracture which is age indeterminate. Electronically Signed   By: Keith Rake M.D.   On: 07/18/2018 23:47   Dg Pelvis Portable  Result Date: 07/18/2018 CLINICAL DATA:  Bruising in the perineum after fall on Saturday. EXAM: PORTABLE PELVIS 1-2 VIEWS COMPARISON:  CT pelvis 05/27/2018 FINDINGS: Redemonstration of nondisplaced left greater trochanteric fracture. Moderate joint space narrowing of hips. No additional  proximal femoral fracture is identified. Slight cortical irregularity suggested of the left superior pubic ramus with faint linear lucencies about the left parasymphysis. Although findings may simply reflect overlapping bowel simulating fractures, CT may help for better assessment. There is mild soft tissue swelling about the hips, right greater than left. IMPRESSION: 1. Nondisplaced known left greater trochanteric fracture. 2. Slight cortical irregularity of the left superior pubic ramus with faint linear lucencies about the left parasymphysis. Although findings may reflect overlapping bowel simulating fractures, CT may help for better assessment based on clinical concern. Electronically Signed   By: Ashley Royalty M.D.   On: 07/18/2018 22:54   Dg Chest Port 1 View  Result Date: 07/18/2018 CLINICAL DATA:  Pain after fall on Saturday EXAM: PORTABLE CHEST 1 VIEW COMPARISON:  CT thoracic spine 02/14/2018 FINDINGS: Stable cardiomegaly with aortic atherosclerosis. No overt pulmonary edema, effusion or contusion. Vascular calcifications project over the apices bilaterally. Left-sided pacemaker apparatus projects over the upper left axilla with leads in the right atrium and right ventricle. No acute pulmonary consolidation. Single level kyphoplasty at T8. Remote right-sided fifth and sixth rib fractures. No acute displaced rib fracture. Osteoarthritis of the acromioclavicular and glenohumeral joints. IMPRESSION: 1. Stable cardiomegaly with aortic atherosclerosis. No acute pulmonary disease. 2. No acute osseous abnormality. Electronically  Signed   By: Ashley Royalty M.D.   On: 07/18/2018 22:58    Pending Labs Unresulted Labs (From admission, onward)    Start     Ordered   07/19/18 0500  CBC  Tomorrow morning,   R     07/19/18 0143   07/19/18 6283  Basic metabolic panel  Tomorrow morning,   R     07/19/18 0143   07/19/18 0029  Type and screen  ONCE - STAT,   STAT     07/19/18 0033   07/18/18 2313  Influenza panel  by PCR (type A & B)  ONCE - STAT,   STAT     07/18/18 2312   07/18/18 2215  Blood Culture (routine x 2)  BLOOD CULTURE X 2,   STAT     07/18/18 2216   07/18/18 2215  Urinalysis, Routine w reflex microscopic  ONCE - STAT,   STAT     07/18/18 2216          Vitals/Pain Today's Vitals   07/19/18 0030 07/19/18 0115 07/19/18 0145 07/19/18 0215  BP: (!) 124/50 (!) 125/44 (!) 136/54 (!) 125/51  Pulse: 74 72 75 73  Resp: 15 (!) 23 19 17   Temp:      TempSrc:      SpO2: 98% 95% 98% 98%  Weight:      Height:      PainSc:        Isolation Precautions No active isolations  Medications Medications  metroNIDAZOLE (FLAGYL) IVPB 500 mg (0 mg Intravenous Stopped 07/19/18 0000)  ceFEPIme (MAXIPIME) 1 g in sodium chloride 0.9 % 100 mL IVPB (0 g Intravenous Stopped 07/19/18 0042)  vancomycin (VANCOCIN) IVPB 500 mg/100 ml premix (has no administration in time range)  phytonadione (VITAMIN K) 5 mg in dextrose 5 % 50 mL IVPB (has no administration in time range)  acetaminophen (TYLENOL) tablet 650 mg (has no administration in time range)    Or  acetaminophen (TYLENOL) suppository 650 mg (has no administration in time range)  ondansetron (ZOFRAN) tablet 4 mg (has no administration in time range)    Or  ondansetron (ZOFRAN) injection 4 mg (has no administration in time range)  amiodarone (PACERONE) tablet 200 mg (has no administration in time range)  carvedilol (COREG) tablet 3.125 mg (has no administration in time range)  HYDROcodone-acetaminophen (NORCO/VICODIN) 5-325 MG per tablet 1-2 tablet (has no administration in time range)  isosorbide mononitrate (IMDUR) 24 hr tablet 60 mg (has no administration in time range)  sevelamer carbonate (RENVELA) tablet 800 mg (has no administration in time range)  pravastatin (PRAVACHOL) tablet 40 mg (has no administration in time range)  ondansetron (ZOFRAN) tablet 4 mg (has no administration in time range)  polyvinyl alcohol (LIQUIFILM TEARS) 1.4 % ophthalmic  solution 1-2 drop (has no administration in time range)  vancomycin (VANCOCIN) 1,250 mg in sodium chloride 0.9 % 250 mL IVPB (0 mg Intravenous Stopped 07/19/18 0142)    Mobility walks with device High fall risk   Focused Assessments Neuro Assessment Handoff:  Swallow screen pass? Yes  Cardiac Rhythm: Normal sinus rhythm       Neuro Assessment:   Neuro Checks:      Last Documented NIHSS Modified Score:   Has TPA been given? No If patient is a Neuro Trauma and patient is going to OR before floor call report to [facility] [place] nurse: [phone] or [phone]     R Recommendations: See Admitting Provider Note  Report given to:  Christina 5N, RN   Additional Notes:

## 2018-07-19 NOTE — Consult Note (Signed)
Reason for Consult:Pelvic fxs Referring Physician: D Elgergawy  Valerie Terrell is an 85 y.o. female.  HPI: Valerie Terrell was in her bathroom bending over to pick up the cat food bowl when she lost her balance and fell. This happened Saturday. She had immediate pain but was able to walk, albeit with great difficulty. The pain gradually increased until she couldn't walk and came to the hospital for evaluation. CT scan showed a right superior ramus fx as well as possible fxs of her left pubis, right sacrum, and coccyx and orthopedic surgery was consulted. She c/o mostly perineal pain, greater on the right.  Past Medical History:  Diagnosis Date  . Anemia of chronic disease 2018   + anemia of CRI?  [name] Atrial fibrillation ([place]) [I48.91] 12/11/2016  . Atrophy of left kidney    with absent blood flow by renal artery dopplers (Dr. Gwenlyn Found)  . 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 Chronic combined systolic and diastolic CHF (congestive heart failure) ([place])    a. 11/2016: echo showing EF of 35-40%, RV strain noted, mild MR and mild TR.   Echo showed much improved EF 12/2017 (55-60%)  . Chronic respiratory failure with hypoxia ([place]) 02/11/2017   Now on chronic O2 (intermittent as of summer 2019).    . Debilitated patient    WC dependent as of 2018  . ESRD on hemodialysis ([place]) 01/2017   T/Th/Sat schedule- [place].  Right basilic AV fistula 62/9528--UXL unable to successfully access this.  Dr. Donnetta Hutching to do an upper arm AV graft as of 10/2017..  . History of adenomatous polyp of colon   . History of kidney stones    passed  . History of subarachnoid hemorrhage 10/2014   after syncope and while on xarelto  . HOH (hard of hearing)   . Hyperlipidemia   . Hypertension    Difficult to control, in the setting of one functioning kidney: pt was referred to nephrology by Dr. Gwenlyn Found 06/2015.  . Lumbar radiculopathy 2012  . Lumbar spinal stenosis 2019   facet injections only very short term relief.  L1  and L2 selective nerve root blocks helpful 04/2018.  . Lumbar spondylosis    MR 07/2016---no sign of spinal nerve compression or cord compression.  Pt set up with outpt ortho while admitted to hosp 07/2016.  . Malnourished ([place])   . Metatarsal fracture 06/10/2016   Nondisplaced, left 5th metatarsal--pt was referred to ortho  . Nonischemic cardiomyopathy ([place])    EF improved to 55-60% on echo 12/2017  . Osteopenia 2014   T-score -2.1  . PAF (paroxysmal atrial fibrillation) ([facility])    Eliquis started after BRAO and CVA.  She was changed to warfarin 2018--INR/coumadin mgmt per HD/nephrol.  . Presence of permanent cardiac pacemaker   . Right rib fracture 12/2016   s/p fall  . Sick sinus syndrome ([place]) 11/2016   Dual chamber pacer insertion 2018 (Dr. Lovena Le)  . Stroke ([place]) 24/4010   cardioembolic (had CVA while on no anticoag)--"scattered subacute punctate infarcts: 1 in R parietal lobe and 2 in occipital cortex" on MRI br.  CT angio head/neck: aortic arch athero.  R ICA 20% stenosis, L ICA w/out any stenosis.  . Thoracic back pain 01/2017; 01/2018   2018: Facet?  Dr. Ramos->steroid injection.  01/2018-->CT T spine to check for a new comp fx-->none found.  Selective nerve root inj in T spine helpful on R side.   . Thoracic compression fracture ([place]) 07/2016  T3.  T7 and T8-- T8 kyphoplasty during hosp admission 07/2016.  Neuro referred pt to pain mgmt for consideration of injection 09/2016.  I referred her to endo 08/2016 for consideration of calcitonin treatment.    Past Surgical History:  Procedure Laterality Date  . APPENDECTOMY  child  . AV FISTULA PLACEMENT Right 03/31/2017   Procedure: Right ARM Basilic vein transposition;  Surgeon: Conrad [place], MD;  Location[facility];  Service: Vascular;  Laterality: Right;  . AV FISTULA PLACEMENT Right 12/06/2017   Procedure: INSERTION OF ARTERIOVENOUS (AV) GORE-TEX GRAFT ARM RIGHT UPPER EXTREMITY;  Surgeon: [name], MD;  Location: [facility];  Service:  Vascular;  Laterality: Right;  . CARDIOVASCULAR STRESS TEST  01/31/2016   Stress myoview: NORMAL/Low risk.  EF 56%.  . [place]  . COLONOSCOPY  2015   + hx of adenomatous polyps.  Need digest health spec in [place] records to see when pt due for next colonoscopy  . EYE SURGERY Bilateral    Catarct  . INSERTION OF DIALYSIS CATHETER Right 01/29/2017   Procedure: INSERTION OF DIALYSIS CATHETER- RIGHT INTERNAL JUGULAR;  Surgeon: [name], MD;  Location: [facility] OR;  Service: Vascular;  Laterality: Right;  . IR GENERIC HISTORICAL  07/24/2016   IR KYPHO THORACIC WITH BONE BIOPSY 07/24/2016 Luanne Bras, MD [facility]-INTER[facility] RAD  . KYPHOPLASTY  07/27/2016   T8  . PACEMAKER IMPLANT N/A 12/03/2016   Procedure: Pacemaker Implant;  Surgeon: Evans Lance, MD;  Location: [facility];  Service: Cardiovascular;  Laterality: N/A;  . Renal artery dopplers  02/26/2016   Her right renal dimension was 11 cm pole to pole with mild to moderate right renal artery stenosis. A right renal aortic ratio was 3.22 suggesting less than a 50% stenosis.  . Right upper arm AV Gore-Tex graft  12/06/2017   Dr. Donnetta Hutching  . TONSILLECTOMY    . TRANSTHORACIC ECHOCARDIOGRAM  01/29/2016; 11/2016; 12/2017   2017: EF 55-60%, normal LV wall motion, grade I DD.  No cardiac source of emboli was seen.  2018: EF 35-40%, could not assess DD due to a-fib, pulm HTN noted. 12/2017 EF 55-60%, nl wall motion, grd II DD, mod MR and pulm regurg, increased pulm pressure (peak 52).    Family History  Problem Relation Age of Onset  . Cancer Mother   . Heart disease Father   . Early death Father   . Sudden Cardiac Death Neg Hx     Social History:  reports that she quit smoking about 46 years ago. She quit after 22.00 years of use. She has never used smokeless tobacco. She reports current alcohol use of about 1.0 standard drinks of alcohol per week. She reports that she does not use drugs.  Allergies:  Allergies  Allergen  Reactions  . Clonidine Derivatives Palpitations and Other (See Comments)    VERY SEDATED  . Adhesive [Tape] Other (See Comments)    Tears skin up  . Codeine Nausea Only  . Nickel Rash  . Sulfa Antibiotics Other (See Comments)    Reaction unknown    Medications: I have reviewed the patient's current medications.  Results for orders placed or performed during the hospital encounter of 07/18/18 (from the past 48 hour(s))  Lactic acid, plasma     Status: None   Collection Time: 07/18/18 10:35 PM  Result Value Ref Range   Lactic Acid, Venous 1.9 0.5 - 1.9 mmol/L    Comment: Performed at [facility], [place] [address], [place], [place] [address]  Comprehensive metabolic panel     Status: Abnormal   Collection Time: 07/18/18 10:36 PM  Result Value Ref Range   Sodium 134 (L) 135 - 145 mmol/L   Potassium 5.3 (H) 3.5 - 5.1 mmol/L   Chloride 91 (L) 98 - 111 mmol/L   CO2 29 22 - 32 mmol/L   Glucose, Bld 108 (H) 70 - 99 mg/dL   BUN 31 (H) 8 - 23 mg/dL   Creatinine, Ser 5.64 (H) 0.44 - 1.00 mg/dL   Calcium 8.3 (L) 8.9 - 10.3 mg/dL   Total Protein 5.8 (L) 6.5 - 8.1 g/dL   Albumin 2.6 (L) 3.5 - 5.0 g/dL   AST 17 15 - 41 U/L   ALT 9 0 - 44 U/L   Alkaline Phosphatase 59 38 - 126 U/L   Total Bilirubin 0.5 0.3 - 1.2 mg/dL   GFR calc non Af Amer 6 (L) >60 mL/min   GFR calc Af Amer 7 (L) >60 mL/min   Anion gap 14 5 - 15    Comment: Performed at [facility], [place] [address] [place], [place] [address]  CBC WITH DIFFERENTIAL     Status: Abnormal   Collection Time: 07/18/18 10:36 PM  Result Value Ref Range   WBC 11.0 (H) 4.0 - 10.5 K/uL   RBC 2.52 (L) 3.87 - 5.11 MIL/uL   Hemoglobin 8.0 (L) 12.0 - 15.0 g/dL   HCT 25.5 (L) 36.0 - 46.0 %   MCV 101.2 (H) 80.0 - 100.0 fL   MCH 31.7 26.0 - 34.0 pg   MCHC 31.4 30.0 - 36.0 g/dL   RDW 14.4 11.5 - 15.5 %   Platelets 173 150 - 400 K/uL   nRBC 0.0 0.0 - 0.2 %   Neutrophils Relative % 76 %   Neutro Abs 8.3 (H) 1.7 - 7.7 K/uL   Lymphocytes  Relative 7 %   Lymphs Abs 0.8 0.7 - 4.0 K/uL   Monocytes Relative 16 %   Monocytes Absolute 1.8 (H) 0.1 - 1.0 K/uL   Eosinophils Relative 0 %   Eosinophils Absolute 0.0 0.0 - 0.5 K/uL   Basophils Relative 0 %   Basophils Absolute 0.0 0.0 - 0.1 K/uL   Immature Granulocytes 1 %   Abs Immature Granulocytes 0.08 (H) 0.00 - 0.07 K/uL    Comment: Performed at [facility], [address] [place], [place] [address]  Protime-INR     Status: Abnormal   Collection Time: 07/18/18 10:36 PM  Result Value Ref Range   Prothrombin Time 29.0 (H) 11.4 - 15.2 seconds   INR 2.8 (H) 0.8 - 1.2    Comment: (NOTE) INR goal varies based on device and disease states. Performed at [facility], [place] [address], [place], [place] [address]   Type and screen     Status: None (Preliminary result)   Collection Time: 07/19/18  3:26 AM  Result Value Ref Range   ABO/RH(D) B POS    Antibody Screen NEG    Sample Expiration      07/22/2018 Performed at [facility], [place] [address] [place], [place] [address]    Unit Number [id number]    Blood Component Type RED CELLS,LR    Unit division 00    Status of Unit ALLOCATED    Transfusion Status OK TO TRANSFUSE    Crossmatch Result Compatible    Unit Number [id number]    Blood  Component Type RED CELLS,LR    Unit division 00    Status of Unit ALLOCATED    Transfusion Status OK TO TRANSFUSE    Crossmatch Result Compatible   CBC     Status: Abnormal   Collection Time: 07/19/18  3:26 AM  Result Value Ref Range   WBC 10.9 (H) 4.0 - 10.5 K/uL   RBC 2.46 (L) 3.87 - 5.11 MIL/uL   Hemoglobin 7.9 (L) 12.0 - 15.0 g/dL   HCT 24.7 (L) 36.0 - 46.0 %   MCV 100.4 (H) 80.0 - 100.0 fL   MCH 32.1 26.0 - 34.0 pg   MCHC 32.0 30.0 - 36.0 g/dL   RDW 14.5 11.5 - 15.5 %   Platelets 167 150 - 400 K/uL   nRBC 0.0 0.0 - 0.2 %    Comment: Performed at [facility], [place]. [address], [place], [place] [address]  Basic metabolic panel     Status: Abnormal    Collection Time: 07/19/18  3:26 AM  Result Value Ref Range   Sodium 137 135 - 145 mmol/L   Potassium 4.8 3.5 - 5.1 mmol/L   Chloride 94 (L) 98 - 111 mmol/L   CO2 27 22 - 32 mmol/L   Glucose, Bld 106 (H) 70 - 99 mg/dL   BUN 34 (H) 8 - 23 mg/dL   Creatinine, Ser 5.79 (H) 0.44 - 1.00 mg/dL   Calcium 8.3 (L) 8.9 - 10.3 mg/dL   GFR calc non Af Amer 6 (L) >60 mL/min   GFR calc Af Amer 7 (L) >60 mL/min   Anion gap 16 (H) 5 - 15    Comment: Performed at [facility] [address] [place], [place] [address]  ABO/Rh     Status: None   Collection Time: 07/19/18  3:26 AM  Result Value Ref Range   ABO/RH(D)      B POS Performed at [facility] [address] [place], [place] [address]   Influenza panel by PCR (type A & B)     Status: None   Collection Time: 07/19/18  4:19 AM  Result Value Ref Range   Influenza A By PCR NEGATIVE NEGATIVE   Influenza B By PCR NEGATIVE NEGATIVE    Comment: (NOTE) The Xpert Xpress Flu assay is intended as an aid in the diagnosis of  influenza and should not be used as a sole basis for treatment.  This  assay is FDA approved for nasopharyngeal swab specimens only. Nasal  washings and aspirates are unacceptable for Xpert Xpress Flu testing. Performed at [facility], [place] [address], [place], [place] [address]   Prepare RBC     Status: None   Collection Time: 07/19/18  7:08 AM  Result Value Ref Range   Order Confirmation      ORDER PROCESSED BY BLOOD BANK Performed at [facility], [place] [address] [place],  [address]     Ct Pelvis Wo Contrast  Result Date: 07/18/2018 CLINICAL DATA:  Right hip pain. Fall Saturday with bruising and swelling. Pelvic and lower abdominal trauma, lower urinary tract trauma suspected. EXAM: CT PELVIS WITHOUT CONTRAST TECHNIQUE: Multidetector CT imaging of the pelvis was performed following the standard protocol without intravenous contrast. COMPARISON:  Pelvic radiograph earlier this day.  Abdominopelvic CT 05/27/2018 FINDINGS: Urinary Tract: Urinary bladder partially distended. No bladder wall thickening. Distal ureters are decompressed. Bowel: Colonic diverticulosis. No acute bowel abnormality of pelvic bowel loops. Vascular/Lymphatic: Dense vascular  calcification of pelvic vasculature. No adenopathy. Reproductive:  No adnexal mass.  Atrophic uterus, normal for age. Other: Subcutaneous heterogeneous high density lesion in the right perineum measuring 6.9 x 5.1 x 5.8 cm with surrounding stranding, most consistent with hematoma. There is no free fluid in the pelvis. Musculoskeletal: Acute nondisplaced right superior pubic ramus fracture, image 51 series 6. Suspected nondisplaced right sacral fracture with presacral edema/hematoma, fracture line not well characterized given osteopenia. Interval callus formation/healing about the known left greater trochanteric fracture from prior CT. Suspect nondisplaced left parasymphyseal pubic body fracture, age indeterminate. Subcutaneous stranding about the right posterior upper thigh and right lateral hip. Mild stranding about the lower anterior abdomen. IMPRESSION: 1. Acute nondisplaced right superior pubic ramus fracture. Suspected nondisplaced right sacral fracture with fracture not well visualized, however there is presacral edema/hematoma. 2. Large 6.9 cm right perineal/inferior gluteal subcutaneous hematoma with associated stranding of the adjacent subcutaneous tissues. Additional stranding/edema of the lower anterior abdominal wall and lateral right hip. 3. Healing left greater trochanteric fracture. Suspected nondisplaced left parasymphyseal pubic body fracture which is age indeterminate. Electronically Signed   By: Keith Rake M.D.   On: 07/18/2018 23:47   Dg Pelvis Portable  Result Date: 07/18/2018 CLINICAL DATA:  Bruising in the perineum after fall on Saturday. EXAM: PORTABLE PELVIS 1-2 VIEWS COMPARISON:  CT pelvis 05/27/2018 FINDINGS:  Redemonstration of nondisplaced left greater trochanteric fracture. Moderate joint space narrowing of hips. No additional proximal femoral fracture is identified. Slight cortical irregularity suggested of the left superior pubic ramus with faint linear lucencies about the left parasymphysis. Although findings may simply reflect overlapping bowel simulating fractures, CT may help for better assessment. There is mild soft tissue swelling about the hips, right greater than left. IMPRESSION: 1. Nondisplaced known left greater trochanteric fracture. 2. Slight cortical irregularity of the left superior pubic ramus with faint linear lucencies about the left parasymphysis. Although findings may reflect overlapping bowel simulating fractures, CT may help for better assessment based on clinical concern. Electronically Signed   By: Ashley Royalty M.D.   On: 07/18/2018 22:54   Dg Chest Port 1 View  Result Date: 07/18/2018 CLINICAL DATA:  Pain after fall on Saturday EXAM: PORTABLE CHEST 1 VIEW COMPARISON:  CT thoracic spine 02/14/2018 FINDINGS: Stable cardiomegaly with aortic atherosclerosis. No overt pulmonary edema, effusion or contusion. Vascular calcifications project over the apices bilaterally. Left-sided pacemaker apparatus projects over the upper left axilla with leads in the right atrium and right ventricle. No acute pulmonary consolidation. Single level kyphoplasty at T8. Remote right-sided fifth and sixth rib fractures. No acute displaced rib fracture. Osteoarthritis of the acromioclavicular and glenohumeral joints. IMPRESSION: 1. Stable cardiomegaly with aortic atherosclerosis. No acute pulmonary disease. 2. No acute osseous abnormality. Electronically Signed   By: Ashley Royalty M.D.   On: 07/18/2018 22:58    Review of Systems  Constitutional: Negative for weight loss.  HENT: Negative for ear discharge, ear pain, hearing loss and tinnitus.   Eyes: Negative for blurred vision, double vision, photophobia and pain.   Respiratory: Negative for cough, sputum production and shortness of breath.   Cardiovascular: Negative for chest pain.  Gastrointestinal: Negative for abdominal pain, nausea and vomiting.  Genitourinary: Negative for dysuria, flank pain, frequency and urgency.  Musculoskeletal: Positive for joint pain (Pelvic/groin). Negative for back pain, falls, myalgias and neck pain.  Neurological: Negative for dizziness, tingling, sensory change, focal weakness, loss of consciousness and headaches.  Endo/Heme/Allergies: Does not bruise/bleed easily.  Psychiatric/Behavioral: Negative for depression, memory  loss and substance abuse. The patient is not nervous/anxious.    Blood pressure (!) 108/35, pulse 82, temperature 97.7 F (36.5 C), resp. rate 18, height 4\' 11"  (1.499 m), weight 57.4 kg, SpO2 (!) 83 %. Physical Exam  Constitutional: She appears well-developed and well-nourished. No distress.  HENT:  Head: Normocephalic and atraumatic.  Eyes: Conjunctivae are normal. Right eye exhibits no discharge. Left eye exhibits no discharge. No scleral icterus.  Neck: Normal range of motion.  Cardiovascular: Normal rate and regular rhythm.  Respiratory: Effort normal. No respiratory distress.  Musculoskeletal:     Comments: Pelvis--no traumatic wounds or rash, no ecchymosis, stable to manual stress, nontender   BLE No traumatic wounds, ecchymosis, or rash  Nontender  No knee or ankle effusion  Knee stable to varus/ valgus and anterior/posterior stress  Sens DPN, SPN, TN intact  Motor EHL, ext, flex, evers 5/5  DP 2+, PT 1+, No significant edema  Neurological: She is alert.  Skin: Skin is warm and dry. She is not diaphoretic.  Psychiatric: She has a normal mood and affect. Her behavior is normal.    Assessment/Plan: Pelvic fxs -- All potential fxs are stable and she may be WBAT. I will order PT/OT. She may f/u with Dr. Stann Mainland although she is a pt of Dr. Nelva Bush' and he may feel comfortable following her  for this.    Lisette Abu, PA-C Orthopedic Surgery [phone] 07/19/2018, 10:31 AM

## 2018-07-19 NOTE — Plan of Care (Signed)

## 2018-07-19 NOTE — Progress Notes (Addendum)
1250: Pre-dialysis report called and given to Ronny Bacon, RN. Per [place], MD 2 units PRBCs to be given in HD. Verdene Lennert, RN aware. All questions answered to satisfaction. Awaiting transport.  1810: Pt returned to room 5N06 after dialysis. Received report from Ronny Bacon, RN. Will continue to monitor.

## 2018-07-19 NOTE — H&P (Signed)
History and Physical    Valerie Terrell HYI:[id number] DOB: July 17, 1932 DOA: 07/18/2018  PCP: Tammi Sou, MD  Patient coming from: Home  I have personally briefly reviewed patient's old medical records in [place]  Chief Complaint: Fall, pelvic pain  HPI: Valerie Terrell is a 85 y.o. female with medical history significant of ESRD on dialysis TTS.  Patient presents for pelvic pain following a fall on Sat.  She was able to walk, however her daughter states that it was with significant difficulty.  She was able to get through her dialysis treatment on Saturday.  She was very weak today and totally unable to ambulate at all today.  Her daughter was helping her change her adult diaper and noticed bruising on her abdomen.  Patient is complaining of severe pain in her perineum.  She states that she was feeding the cat when she fell over and onto her bottom.  She also has severe pain today in her sacrum.  She denies leg weakness. She is on coumadin.   ED Course: HGB 8, down from 11 x2 weeks ago.  CT pelvis reveals Superior R pubic ramus fx, suspected non-disp R sacral fx.  R gluteal / perineal 6.9cm Hematoma with surrounding stranding.  Additionally patient has 101.1 temp in ED.  No respiratory nor URI symptoms.  No erythema nor tenderness at dialysis graft site.   Review of Systems: As per HPI otherwise 10 point review of systems negative.   Past Medical History:  Diagnosis Date  . Anemia of chronic disease 2018   + anemia of CRI?  [name] Atrial fibrillation ([place]) [I48.91] 12/11/2016  . Atrophy of left kidney    with absent blood flow by renal artery dopplers (Dr. Gwenlyn Found)  . 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 Chronic combined systolic and diastolic CHF (congestive heart failure) ([place])    a. 11/2016: echo showing EF of 35-40%, RV strain noted, mild MR and mild TR.   Echo showed much improved EF 12/2017 (55-60%)  . Chronic respiratory failure with hypoxia ([place]) 02/11/2017    Now on chronic O2 (intermittent as of summer 2019).    . Debilitated patient    WC dependent as of 2018  . ESRD on hemodialysis ([place]) 01/2017   T/Th/Sat schedule- [facility].  Right basilic AV fistula [id number]/6767--[facility] unable to successfully access this.  Dr. Donnetta Hutching to do an upper arm AV graft as of 10/2017..  . History of adenomatous polyp of colon   . History of kidney stones    passed  . History of subarachnoid hemorrhage 10/2014   after syncope and while on xarelto  . HOH (hard of hearing)   . Hyperlipidemia   . Hypertension    Difficult to control, in the setting of one functioning kidney: pt was referred to nephrology by Dr. Gwenlyn Found 06/2015.  . Lumbar radiculopathy 2012  . Lumbar spinal stenosis 2019   facet injections only very short term relief.  L1 and L2 selective nerve root blocks helpful 04/2018.  . Lumbar spondylosis    MR 07/2016---no sign of spinal nerve compression or cord compression.  Pt set up with outpt ortho while admitted to hosp 07/2016.  . Malnourished ([place])   . Metatarsal fracture 06/10/2016   Nondisplaced, left 5th metatarsal--pt was referred to ortho  . Nonischemic cardiomyopathy ([place])    EF improved to 55-60% on echo 12/2017  . Osteopenia 2014   T-score -2.1  . PAF (paroxysmal atrial fibrillation) ([place])  Eliquis started after BRAO and CVA.  She was changed to warfarin 2018--INR/coumadin mgmt per HD/nephrol.  . Presence of permanent cardiac pacemaker   . Right rib fracture 12/2016   s/p fall  . Sick sinus syndrome ([place]) 11/2016   Dual chamber pacer insertion 2018 (Dr. Lovena Le)  . Stroke ([place]) 63/8466   cardioembolic (had CVA while on no anticoag)--"scattered subacute punctate infarcts: 1 in R parietal lobe and 2 in occipital cortex" on MRI br.  CT angio head/neck: aortic arch athero.  R ICA 20% stenosis, L ICA w/out any stenosis.  . Thoracic back pain 01/2017; 01/2018   2018: Facet?  Dr. Ramos->steroid injection.  01/2018-->CT T spine to check for a new comp  fx-->none found.  Selective nerve root inj in T spine helpful on R side.   . Thoracic compression fracture ([place]) 07/2016   T3.  T7 and T8-- T8 kyphoplasty during hosp admission 07/2016.  Neuro referred pt to pain mgmt for consideration of injection 09/2016.  I referred her to endo 08/2016 for consideration of calcitonin treatment.    Past Surgical History:  Procedure Laterality Date  . APPENDECTOMY  child  . AV FISTULA PLACEMENT Right 03/31/2017   Procedure: Right ARM Basilic vein transposition;  Surgeon: Conrad Manton, MD;  Location: [facility] OR;  Service: Vascular;  Laterality: Right;  . AV FISTULA PLACEMENT Right 12/06/2017   Procedure: INSERTION OF ARTERIOVENOUS (AV) GORE-TEX GRAFT ARM RIGHT UPPER EXTREMITY;  Surgeon: [name], MD;  Location: [facility];  Service: Vascular;  Laterality: Right;  . CARDIOVASCULAR STRESS TEST  01/31/2016   Stress myoview: NORMAL/Low risk.  EF 56%.  . [place]  . COLONOSCOPY  2015   + hx of adenomatous polyps.  Need digest health spec in [place] records to see when pt due for next colonoscopy  . EYE SURGERY Bilateral    Catarct  . INSERTION OF DIALYSIS CATHETER Right 01/29/2017   Procedure: INSERTION OF DIALYSIS CATHETER- RIGHT INTERNAL JUGULAR;  Surgeon: [name], MD;  Location: [facility] OR;  Service: Vascular;  Laterality: Right;  . IR GENERIC HISTORICAL  07/24/2016   IR KYPHO THORACIC WITH BONE BIOPSY 07/24/2016 Luanne Bras, MD [facility]-INTER[facility] RAD  . KYPHOPLASTY  07/27/2016   T8  . PACEMAKER IMPLANT N/A 12/03/2016   Procedure: Pacemaker Implant;  Surgeon: Evans Lance, MD;  Location: [facility] CV LAB;  Service: Cardiovascular;  Laterality: N/A;  . Renal artery dopplers  02/26/2016   Her right renal dimension was 11 cm pole to pole with mild to moderate right renal artery stenosis. A right renal aortic ratio was 3.22 suggesting less than a 50% stenosis.  . Right upper arm AV Gore-Tex graft  12/06/2017   Dr. Donnetta Hutching  . TONSILLECTOMY    .  TRANSTHORACIC ECHOCARDIOGRAM  01/29/2016; 11/2016; 12/2017   2017: EF 55-60%, normal LV wall motion, grade I DD.  No cardiac source of emboli was seen.  2018: EF 35-40%, could not assess DD due to a-fib, pulm HTN noted. 12/2017 EF 55-60%, nl wall motion, grd II DD, mod MR and pulm regurg, increased pulm pressure (peak 52).     reports that she quit smoking about 46 years ago. She quit after 22.00 years of use. She has never used smokeless tobacco. She reports current alcohol use of about 1.0 standard drinks of alcohol per week. She reports that she does not use drugs.  Allergies  Allergen Reactions  . Clonidine Derivatives Palpitations and Other (See Comments)  VERY SEDATED  . Adhesive [Tape] Other (See Comments)    Tears skin up  . Codeine Nausea Only  . Nickel Rash  . Sulfa Antibiotics Other (See Comments)    Reaction unknown    Family History  Problem Relation Age of Onset  . Cancer Mother   . Heart disease Father   . Early death Father   . Sudden Cardiac Death Neg Hx      Prior to Admission medications   Medication Sig Start Date End Date Taking? Authorizing Provider  acetaminophen (TYLENOL) 500 MG tablet Take 500-1,000 mg 3 (three) times daily as needed by mouth for moderate pain.    Yes [provider]  amiodarone (PACERONE) 200 MG tablet TAKE 1 TABLET BY MOUTH  DAILY Patient taking differently: Take 200 mg by mouth daily.  04/11/18  Yes Lorretta Harp, MD  carvedilol (COREG) 3.125 MG tablet TAKE 1 TABLET BY MOUTH TWO  TIMES DAILY Patient taking differently: Take 3.125 mg by mouth 2 (two) times daily as needed (blood pressure).  06/27/18  Yes Lorretta Harp, MD  HYDROcodone-acetaminophen (NORCO) 5-325 MG tablet Take 1-2 tablets by mouth every 6 (six) hours as needed for moderate pain or severe pain. 06/03/18  Yes McGowen, Adrian Blackwater, MD  isosorbide mononitrate (IMDUR) 60 MG 24 hr tablet TAKE 1 TABLET BY MOUTH  DAILY Patient taking differently: Take 60 mg by mouth  daily.  03/09/18  Yes McGowen, Adrian Blackwater, MD  ondansetron (ZOFRAN) 4 MG tablet Take 1 tablet (4 mg total) by mouth every 8 (eight) hours as needed for nausea or vomiting. 06/03/18  Yes McGowen, Adrian Blackwater, MD  polyvinyl alcohol (LIQUIFILM TEARS) 1.4 % ophthalmic solution Place 1-2 drops into both eyes as needed for dry eyes.   Yes [provider]  pravastatin (PRAVACHOL) 40 MG tablet TAKE 1 TABLET BY MOUTH  EVERY EVENING Patient taking differently: Take 40 mg by mouth daily.  03/09/18  Yes McGowen, Adrian Blackwater, MD  sevelamer carbonate (RENVELA) 800 MG tablet Take 800 mg by mouth 3 (three) times daily with meals.    Yes [provider]  vitamin C (ASCORBIC ACID) 500 MG tablet Take 500 mg by mouth daily.   Yes [provider]  Vitamin D, Ergocalciferol, (DRISDOL) 1.25 MG (50000 UT) CAPS capsule Take 1 capsule (50,000 Units total) by mouth every Thursday. 04/28/18  Yes McGowen, Adrian Blackwater, MD  warfarin (COUMADIN) 3 MG tablet TAKE 1/2 TO 1 TABLET BY  MOUTH DAILY AS DIRECTED BY  COUMADIN CLINIC Patient taking differently: Take 1.5-3 mg by mouth See admin instructions. Take 3 mg on Tuesday and Thursday and 1.5 mg all other days of the week. 12/24/17  Yes Lorretta Harp, MD    Physical Exam: Vitals:   07/18/18 2217 07/18/18 2218 07/18/18 2300 07/19/18 0000  BP:   128/89 (!) 119/47  Pulse:   72 71  Resp:   19 18  Temp:  (!) 101.1 F (38.4 C)    TempSrc:  Rectal    SpO2:   97% 99%  Weight: 54.4 kg     Height: 4\' 11"  (1.499 m)       Constitutional: NAD, calm, comfortable Eyes: PERRL, lids and conjunctivae normal ENMT: Mucous membranes are moist. Posterior pharynx clear of any exudate or lesions.Normal dentition.  Neck: normal, supple, no masses, no thyromegaly Respiratory: clear to auscultation bilaterally, no wheezing, no crackles. Normal respiratory effort. No accessory muscle use.  Cardiovascular: Regular rate and rhythm, no murmurs /  rubs / gallops. No extremity edema. 2+  pedal pulses. No carotid bruits.  Abdomen: no tenderness, no masses palpated. No hepatosplenomegaly. Bowel sounds positive.  Musculoskeletal: no clubbing / cyanosis. No joint deformity upper and lower extremities. Good ROM, no contractures. Normal muscle tone.  Skin: no rashes, lesions, ulcers. No induration Neurologic: CN 2-12 grossly intact. Sensation intact, DTR normal. Strength 5/5 in all 4.  Psychiatric: Normal judgment and insight. Alert and oriented x 3. Normal mood.    Labs on Admission: I have personally reviewed following labs and imaging studies  CBC: Recent Labs  Lab 07/18/18 2236  WBC 11.0*  NEUTROABS 8.3*  HGB 8.0*  HCT 25.5*  MCV 101.2*  PLT 793   Basic Metabolic Panel: Recent Labs  Lab 07/18/18 2236  NA 134*  K 5.3*  CL 91*  CO2 29  GLUCOSE 108*  BUN 31*  CREATININE 5.64*  CALCIUM 8.3*   GFR: Estimated Creatinine Clearance: 5.7 mL/min (A) (by C-G formula based on SCr of 5.64 mg/dL (H)). Liver Function Tests: Recent Labs  Lab 07/18/18 2236  AST 17  ALT 9  ALKPHOS 59  BILITOT 0.5  PROT 5.8*  ALBUMIN 2.6*   No results for input(s): LIPASE, AMYLASE in the last 168 hours. No results for input(s): AMMONIA in the last 168 hours. Coagulation Profile: Recent Labs  Lab 07/15/18 1332 07/18/18 2236  INR 1.9* 2.8*   Cardiac Enzymes: No results for input(s): CKTOTAL, CKMB, CKMBINDEX, TROPONINI in the last 168 hours. BNP (last 3 results) No results for input(s): PROBNP in the last 8760 hours. HbA1C: No results for input(s): HGBA1C in the last 72 hours. CBG: No results for input(s): GLUCAP in the last 168 hours. Lipid Profile: No results for input(s): CHOL, HDL, LDLCALC, TRIG, CHOLHDL, LDLDIRECT in the last 72 hours. Thyroid Function Tests: No results for input(s): TSH, T4TOTAL, FREET4, T3FREE, THYROIDAB in the last 72 hours. Anemia Panel: No results for input(s): VITAMINB12, FOLATE, FERRITIN, TIBC, IRON, RETICCTPCT in the last 72 hours. Urine  analysis:    Component Value Date/Time   COLORURINE YELLOW 01/20/2017 [place] 01/20/2017 1737   LABSPEC 1.012 01/20/2017 1737   PHURINE 5.0 01/20/2017 1737   GLUCOSEU NEGATIVE 01/20/2017 1737   HGBUR NEGATIVE 01/20/2017 1737   BILIRUBINUR NEGATIVE 01/20/2017 1737   BILIRUBINUR negative 09/24/2016 [place] 01/20/2017 1737   PROTEINUR 30 (A) 01/20/2017 1737   UROBILINOGEN 0.2 09/24/2016 1453   NITRITE NEGATIVE 01/20/2017 1737   LEUKOCYTESUR NEGATIVE 01/20/2017 1737    Radiological Exams on Admission: Ct Pelvis Wo Contrast  Result Date: 07/18/2018 CLINICAL DATA:  Right hip pain. Fall Saturday with bruising and swelling. Pelvic and lower abdominal trauma, lower urinary tract trauma suspected. EXAM: CT PELVIS WITHOUT CONTRAST TECHNIQUE: Multidetector CT imaging of the pelvis was performed following the standard protocol without intravenous contrast. COMPARISON:  Pelvic radiograph earlier this day. Abdominopelvic CT 05/27/2018 FINDINGS: Urinary Tract: Urinary bladder partially distended. No bladder wall thickening. Distal ureters are decompressed. Bowel: Colonic diverticulosis. No acute bowel abnormality of pelvic bowel loops. Vascular/Lymphatic: Dense vascular calcification of pelvic vasculature. No adenopathy. Reproductive:  No adnexal mass.  Atrophic uterus, normal for age. Other: Subcutaneous heterogeneous high density lesion in the right perineum measuring 6.9 x 5.1 x 5.8 cm with surrounding stranding, most consistent with hematoma. There is no free fluid in the pelvis. Musculoskeletal: Acute nondisplaced right superior pubic ramus fracture, image 51 series 6. Suspected nondisplaced right sacral fracture with presacral edema/hematoma, fracture line not  well characterized given osteopenia. Interval callus formation/healing about the known left greater trochanteric fracture from prior CT. Suspect nondisplaced left parasymphyseal pubic body fracture, age  indeterminate. Subcutaneous stranding about the right posterior upper thigh and right lateral hip. Mild stranding about the lower anterior abdomen. IMPRESSION: 1. Acute nondisplaced right superior pubic ramus fracture. Suspected nondisplaced right sacral fracture with fracture not well visualized, however there is presacral edema/hematoma. 2. Large 6.9 cm right perineal/inferior gluteal subcutaneous hematoma with associated stranding of the adjacent subcutaneous tissues. Additional stranding/edema of the lower anterior abdominal wall and lateral right hip. 3. Healing left greater trochanteric fracture. Suspected nondisplaced left parasymphyseal pubic body fracture which is age indeterminate. Electronically Signed   By: Keith Rake M.D.   On: 07/18/2018 23:47   Dg Pelvis Portable  Result Date: 07/18/2018 CLINICAL DATA:  Bruising in the perineum after fall on Saturday. EXAM: PORTABLE PELVIS 1-2 VIEWS COMPARISON:  CT pelvis 05/27/2018 FINDINGS: Redemonstration of nondisplaced left greater trochanteric fracture. Moderate joint space narrowing of hips. No additional proximal femoral fracture is identified. Slight cortical irregularity suggested of the left superior pubic ramus with faint linear lucencies about the left parasymphysis. Although findings may simply reflect overlapping bowel simulating fractures, CT may help for better assessment. There is mild soft tissue swelling about the hips, right greater than left. IMPRESSION: 1. Nondisplaced known left greater trochanteric fracture. 2. Slight cortical irregularity of the left superior pubic ramus with faint linear lucencies about the left parasymphysis. Although findings may reflect overlapping bowel simulating fractures, CT may help for better assessment based on clinical concern. Electronically Signed   By: Ashley Royalty M.D.   On: 07/18/2018 22:54   Dg Chest Port 1 View  Result Date: 07/18/2018 CLINICAL DATA:  Pain after fall on Saturday EXAM: PORTABLE  CHEST 1 VIEW COMPARISON:  CT thoracic spine 02/14/2018 FINDINGS: Stable cardiomegaly with aortic atherosclerosis. No overt pulmonary edema, effusion or contusion. Vascular calcifications project over the apices bilaterally. Left-sided pacemaker apparatus projects over the upper left axilla with leads in the right atrium and right ventricle. No acute pulmonary consolidation. Single level kyphoplasty at T8. Remote right-sided fifth and sixth rib fractures. No acute displaced rib fracture. Osteoarthritis of the acromioclavicular and glenohumeral joints. IMPRESSION: 1. Stable cardiomegaly with aortic atherosclerosis. No acute pulmonary disease. 2. No acute osseous abnormality. Electronically Signed   By: Ashley Royalty M.D.   On: 07/18/2018 22:58    EKG: Independently reviewed.  Assessment/Plan Principal Problem:   Perineal hematoma Active Problems:   PAF (paroxysmal atrial fibrillation) (HCC)   Essential hypertension   Long term (current) use of anticoagulants [Z79.01]   ESRD on dialysis ([place])   Fracture of pubic ramus (HCC)   Fracture of sacrum (HCC)   Fever   Acute blood loss anemia    1. Perineal hematoma - 1. With surrounding stranding, fever 101.1 2. Possibly infected? Will leave on cefepime / flagyl / vanc started in ED for the moment 3. 5mg  IV vit K for coumadin reversal 4. Repeat CBC in AM 5. Call Ortho surg in AM 2. Fx of pubic ramus and sacrum - 1. Call ortho in AM 2. Likely will be WBAT with PT/OT and non-operative management I suspect 3. Unclear if the associated hematoma will need operative management or not. 3. Acute blood loss anemia - 1. 3gm HGB drop with the hematoma 2. Repeat CBC in AM 3. Type and screen 4. PAF - 1. Cont amiodarone 2. Hold coumadin and reverse 3. Cont coreg 5. HTN -  1. Cont imdur 6. ESRD - 1. Call nephro in AM for routine dialysis 7. Fever - possibly secondary to hematoma / infected hematoma 1. Call ortho in AM 2. Influenza pending but no URI  nor LRI symptoms 3. BCx pending 4. No other obvious source, dialysis access looks okay  DVT prophylaxis: SCDs Code Status: Full Family Communication: Daughter at bedside Disposition Plan: TBD Consults called: None Admission status: Place in 33     Safia Panzer, [place] Hospital[facility]  How to contact the [facility] Attending or Consulting provider [place] or covering provider during after hours [place], for this patient?  1. Check the care team in [facility] and look for a) attending/consulting [facility] provider listed and b) the [facility] team listed 2. Log into [email or web address]  Amion Physician Scheduling and messaging for groups and whole hospitals  On call and physician scheduling software for group practices, residents, hospitalists and other medical providers for call, clinic, rotation and shift schedules. OnCall [employer] is a hospital-wide system for scheduling doctors and paging doctors on call. EasyPlot is for scientific plotting and data analysis.  [email or web address]  and use [facility]'s universal password to access. If you do not have the password, please contact the hospital operator.  3. Locate the [facility] provider you are looking for under [facility] and page to a number that you can be directly reached. 4. If you still have difficulty reaching the provider, please page the [facility] (Director on Call) for the Hospitalists listed on amion for assistance.  07/19/2018, 2:01 AM

## 2018-07-19 NOTE — ED Provider Notes (Signed)
Assumed care from PA Harris at shift change.  See prior notes for full H&P.  Briefly, 85 y.o. F here after a fall 3 days ago.  Difficulty walking today.  Daughter has also noticed some bruising on abdomen/pelvis.  Has pain in her hips and sacrum.  On coumadin for AFIB.  Also noted to be febrile here.  CT with several pelvic fractures and hematoma in pelvis and anterior to sacrum.  Will require admission.  abx have been started.  Patient is on HD, treatments on Tue/Thur/Sat.  Plan:  Remainder of labs pending.  Admit.  Results for orders placed or performed during the hospital encounter of 07/18/18  Lactic acid, plasma  Result Value Ref Range   Lactic Acid, Venous 1.9 0.5 - 1.9 mmol/L  Comprehensive metabolic panel  Result Value Ref Range   Sodium 134 (L) 135 - 145 mmol/L   Potassium 5.3 (H) 3.5 - 5.1 mmol/L   Chloride 91 (L) 98 - 111 mmol/L   CO2 29 22 - 32 mmol/L   Glucose, Bld 108 (H) 70 - 99 mg/dL   BUN 31 (H) 8 - 23 mg/dL   Creatinine, Ser 5.64 (H) 0.44 - 1.00 mg/dL   Calcium 8.3 (L) 8.9 - 10.3 mg/dL   Total Protein 5.8 (L) 6.5 - 8.1 g/dL   Albumin 2.6 (L) 3.5 - 5.0 g/dL   AST 17 15 - 41 U/L   ALT 9 0 - 44 U/L   Alkaline Phosphatase 59 38 - 126 U/L   Total Bilirubin 0.5 0.3 - 1.2 mg/dL   GFR calc non Af Amer 6 (L) >60 mL/min   GFR calc Af Amer 7 (L) >60 mL/min   Anion gap 14 5 - 15  CBC WITH DIFFERENTIAL  Result Value Ref Range   WBC 11.0 (H) 4.0 - 10.5 K/uL   RBC 2.52 (L) 3.87 - 5.11 MIL/uL   Hemoglobin 8.0 (L) 12.0 - 15.0 g/dL   HCT 25.5 (L) 36.0 - 46.0 %   MCV 101.2 (H) 80.0 - 100.0 fL   MCH 31.7 26.0 - 34.0 pg   MCHC 31.4 30.0 - 36.0 g/dL   RDW 14.4 11.5 - 15.5 %   Platelets 173 150 - 400 K/uL   nRBC 0.0 0.0 - 0.2 %   Neutrophils Relative % 76 %   Neutro Abs 8.3 (H) 1.7 - 7.7 K/uL   Lymphocytes Relative 7 %   Lymphs Abs 0.8 0.7 - 4.0 K/uL   Monocytes Relative 16 %   Monocytes Absolute 1.8 (H) 0.1 - 1.0 K/uL   Eosinophils Relative 0 %   Eosinophils Absolute 0.0 0.0  - 0.5 K/uL   Basophils Relative 0 %   Basophils Absolute 0.0 0.0 - 0.1 K/uL   Immature Granulocytes 1 %   Abs Immature Granulocytes 0.08 (H) 0.00 - 0.07 K/uL  Protime-INR  Result Value Ref Range   Prothrombin Time 29.0 (H) 11.4 - 15.2 seconds   INR 2.8 (H) 0.8 - 1.2   Ct Pelvis Wo Contrast  Result Date: 07/18/2018 CLINICAL DATA:  Right hip pain. Fall Saturday with bruising and swelling. Pelvic and lower abdominal trauma, lower urinary tract trauma suspected. EXAM: CT PELVIS WITHOUT CONTRAST TECHNIQUE: Multidetector CT imaging of the pelvis was performed following the standard protocol without intravenous contrast. COMPARISON:  Pelvic radiograph earlier this day. Abdominopelvic CT 05/27/2018 FINDINGS: Urinary Tract: Urinary bladder partially distended. No bladder wall thickening. Distal ureters are decompressed. Bowel: Colonic diverticulosis. No acute bowel abnormality of pelvic bowel loops.  Vascular/Lymphatic: Dense vascular calcification of pelvic vasculature. No adenopathy. Reproductive:  No adnexal mass.  Atrophic uterus, normal for age. Other: Subcutaneous heterogeneous high density lesion in the right perineum measuring 6.9 x 5.1 x 5.8 cm with surrounding stranding, most consistent with hematoma. There is no free fluid in the pelvis. Musculoskeletal: Acute nondisplaced right superior pubic ramus fracture, image 51 series 6. Suspected nondisplaced right sacral fracture with presacral edema/hematoma, fracture line not well characterized given osteopenia. Interval callus formation/healing about the known left greater trochanteric fracture from prior CT. Suspect nondisplaced left parasymphyseal pubic body fracture, age indeterminate. Subcutaneous stranding about the right posterior upper thigh and right lateral hip. Mild stranding about the lower anterior abdomen. IMPRESSION: 1. Acute nondisplaced right superior pubic ramus fracture. Suspected nondisplaced right sacral fracture with fracture not well  visualized, however there is presacral edema/hematoma. 2. Large 6.9 cm right perineal/inferior gluteal subcutaneous hematoma with associated stranding of the adjacent subcutaneous tissues. Additional stranding/edema of the lower anterior abdominal wall and lateral right hip. 3. Healing left greater trochanteric fracture. Suspected nondisplaced left parasymphyseal pubic body fracture which is age indeterminate. Electronically Signed   By: Keith Rake M.D.   On: 07/18/2018 23:47   Dg Pelvis Portable  Result Date: 07/18/2018 CLINICAL DATA:  Bruising in the perineum after fall on Saturday. EXAM: PORTABLE PELVIS 1-2 VIEWS COMPARISON:  CT pelvis 05/27/2018 FINDINGS: Redemonstration of nondisplaced left greater trochanteric fracture. Moderate joint space narrowing of hips. No additional proximal femoral fracture is identified. Slight cortical irregularity suggested of the left superior pubic ramus with faint linear lucencies about the left parasymphysis. Although findings may simply reflect overlapping bowel simulating fractures, CT may help for better assessment. There is mild soft tissue swelling about the hips, right greater than left. IMPRESSION: 1. Nondisplaced known left greater trochanteric fracture. 2. Slight cortical irregularity of the left superior pubic ramus with faint linear lucencies about the left parasymphysis. Although findings may reflect overlapping bowel simulating fractures, CT may help for better assessment based on clinical concern. Electronically Signed   By: Ashley Royalty M.D.   On: 07/18/2018 22:54   Dg Chest Port 1 View  Result Date: 07/18/2018 CLINICAL DATA:  Pain after fall on Saturday EXAM: PORTABLE CHEST 1 VIEW COMPARISON:  CT thoracic spine 02/14/2018 FINDINGS: Stable cardiomegaly with aortic atherosclerosis. No overt pulmonary edema, effusion or contusion. Vascular calcifications project over the apices bilaterally. Left-sided pacemaker apparatus projects over the upper left  axilla with leads in the right atrium and right ventricle. No acute pulmonary consolidation. Single level kyphoplasty at T8. Remote right-sided fifth and sixth rib fractures. No acute displaced rib fracture. Osteoarthritis of the acromioclavicular and glenohumeral joints. IMPRESSION: 1. Stable cardiomegaly with aortic atherosclerosis. No acute pulmonary disease. 2. No acute osseous abnormality. Electronically Signed   By: Ashley Royalty M.D.   On: 07/18/2018 22:58   Initial lactate normal.  Does have 3 gram drop in hemoglobin.  Given this occurred 3 days ago, doubt active bleeding.  Likely will need trending of hgb, PT for pelvic fractures, follow-up on blood cultures and flu swab.    Discussed with hospitalist, Dr. Alcario Drought-- will admit for ongoing care.   Larene Pickett, PA-C 07/19/18 0424    Ward, Delice Bison, DO 07/19/18 [phone]

## 2018-07-19 NOTE — Progress Notes (Signed)
PROGRESS NOTE                                                                                                                                                                                                             Patient Demographics:    Valerie Terrell, is a 85 y.o. female, DOB - [birth year], KPT:[id number]  Admit date - 07/18/2018   Admitting Physician Etta Quill, DO  Outpatient Primary MD for the patient is McGowen, Adrian Blackwater, MD  LOS - 0   Chief Complaint  Patient presents with  . Fall       Brief Narrative    This is a no charge note as patient admitted earlier today  85 y.o. female with medical history significant of ESRD on dialysis TTS.  Patient presents for pelvic pain following a fall on Sat.  She was able to walk, however her daughter states that it was with significant difficulty.She was able to get through her dialysis treatment on Saturday. She was very weak today and totally unable to ambulate at all today. Her daughter was helping her change her adult diaper and noticed bruising on her abdomen. Patient is complaining of severe pain in her perineum. She states that she was feeding the cat when she fell over and onto her bottom. She also has severe pain today in her sacrum. She denies leg weakness. She is on coumadin   Subjective:    [name] today for generalized weakness, pain in pelvic and sacral area, currently denies any fever or chills.   Assessment  & Plan :    Principal Problem:   Perineal hematoma Active Problems:   PAF (paroxysmal atrial fibrillation) (HCC)   Essential hypertension   Long term (current) use of anticoagulants [Z79.01]   ESRD on dialysis ([facility])   Fracture of pubic ramus (HCC)   Fracture of sacrum (HCC)   Fever   Acute blood loss anemia   Perineal hematoma - -this is secondary to mechanical fall, doing with surrounding stranding, fever of 101.1 in ED, is nontoxic appearing. -He  had 3 g drop in her hemoglobin, she will be transfused 2 units PRBC during hemodialysis. -When her fever and surrounding stranding, will continue with IV cefepime, Flagyl and vancomycin for presumed infection -Versed coagulopathy, she received 5 mg of IV vitamin K in ED -Orthopedic consult  greatly appreciated, weightbearing as tolerated, nonoperative management  Acute blood loss anemia -Secondary to hematoma, receiving 2 units PRBC  Fx of pubic ramus and sacrum  -Hepatic consult appreciated, weightbearing as tolerated, continue with pain management  Paroxysmal A. Fib -Continue with amiodarone -On warfarin, will continue to hold, I have discussed with daughter, this is her second fall(he had one fall in December with hip fracture), continue hold warfarin during hospital stay, and will defer decision to resume anticoagulation to her primary cardiologist as an outpatient.  HTN - Cont imdur  ESRD - Renal Consulted  Fever  - possibly secondary to hematoma / infected hematoma    Code Status : Full  Family Communication  : Daughter at bedside  Disposition Plan  : pending PT evaluation  Barriers For Discharge : She remains on IV antibiotics, being transfused units PRBC today, will need close monitoring for her hemoglobin, will need PT/OT evaluation  Consults  :  Ortho, renal  Procedures  : None  DVT Prophylaxis  : SCD  Lab Results  Component Value Date   PLT 167 07/19/2018    Antibiotics  :    Anti-infectives (From admission, onward)   Start     Dose/Rate Route Frequency Ordered Stop   07/19/18 1200  vancomycin (VANCOCIN) IVPB 500 mg/100 ml premix     500 mg 100 mL/hr over 60 Minutes Intravenous Every T-Th-Sa (Hemodialysis) 07/18/18 2226     07/18/18 2230  ceFEPIme (MAXIPIME) 2 g in sodium chloride 0.9 % 100 mL IVPB  Status:  Discontinued     2 g 200 mL/hr over 30 Minutes Intravenous  Once 07/18/18 2216 07/18/18 2224   07/18/18 2230  metroNIDAZOLE (FLAGYL) IVPB 500 mg      500 mg 100 mL/hr over 60 Minutes Intravenous Every 8 hours 07/18/18 2216     07/18/18 2230  vancomycin (VANCOCIN) IVPB 1000 mg/200 mL premix  Status:  Discontinued     1,000 mg 200 mL/hr over 60 Minutes Intravenous  Once 07/18/18 2216 07/18/18 2224   07/18/18 2230  ceFEPIme (MAXIPIME) 1 g in sodium chloride 0.9 % 100 mL IVPB     1 g 200 mL/hr over 30 Minutes Intravenous Every 24 hours 07/18/18 2224     07/18/18 2230  vancomycin (VANCOCIN) 1,250 mg in sodium chloride 0.9 % 250 mL IVPB     1,250 mg 166.7 mL/hr over 90 Minutes Intravenous  Once 07/18/18 2224 07/19/18 0142        Objective:   Vitals:   07/19/18 0259 07/19/18 0513 07/19/18 0907 07/19/18 0931  BP: (!) 130/46 (!) 155/47 (!) 108/35   Pulse: 80 81 82   Resp: 18 18    Temp: (!) 97.1 F (36.2 C) 98.2 F (36.8 C)  97.7 F (36.5 C)  TempSrc: Oral Oral    SpO2: 92% 92% (!) 83%   Weight: 57.4 kg     Height: 4\' 11"  (1.499 m)       Wt Readings from Last 3 Encounters:  07/19/18 57.4 kg  07/06/18 54.4 kg  05/27/18 54.9 kg     Intake/Output Summary (Last 24 hours) at 07/19/2018 1304 Last data filed at 07/19/2018 0900 Gross per 24 hour  Intake 720 ml  Output -  Net 720 ml     Physical Exam  Awake Alert, Oriented X 3, No new F.N deficits, Normal affect Symmetrical Chest wall movement, Good air movement bilaterally, CTAB RRR,No Gallops,Rubs or new Murmurs, No Parasternal Heave +ve B.Sounds, Abd Soft, No tenderness,No  rebound - guarding or rigidity. No Cyanosis, Clubbing or edema, patient with significant bruising in the right groin area, suprapubic area, and right groin area    Data Review:    CBC Recent Labs  Lab 07/18/18 2236 07/19/18 0326  WBC 11.0* 10.9*  HGB 8.0* 7.9*  HCT 25.5* 24.7*  PLT 173 167  MCV 101.2* 100.4*  MCH 31.7 32.1  MCHC 31.4 32.0  RDW 14.4 14.5  LYMPHSABS 0.8  --   MONOABS 1.8*  --   EOSABS 0.0  --   BASOSABS 0.0  --     Chemistries  Recent Labs  Lab 07/18/18 2236  07/19/18 0326  NA 134* 137  K 5.3* 4.8  CL 91* 94*  CO2 29 27  GLUCOSE 108* 106*  BUN 31* 34*  CREATININE 5.64* 5.79*  CALCIUM 8.3* 8.3*  AST 17  --   ALT 9  --   ALKPHOS 59  --   BILITOT 0.5  --    ------------------------------------------------------------------------------------------------------------------ No results for input(s): CHOL, HDL, LDLCALC, TRIG, CHOLHDL, LDLDIRECT in the last 72 hours.  Lab Results  Component Value Date   HGBA1C 5.2 12/15/2016   ------------------------------------------------------------------------------------------------------------------ No results for input(s): TSH, T4TOTAL, T3FREE, THYROIDAB in the last 72 hours.  Invalid input(s): FREET3 ------------------------------------------------------------------------------------------------------------------ No results for input(s): VITAMINB12, FOLATE, FERRITIN, TIBC, IRON, RETICCTPCT in the last 72 hours.  Coagulation profile Recent Labs  Lab 07/15/18 1332 07/18/18 2236  INR 1.9* 2.8*    No results for input(s): DDIMER in the last 72 hours.  Cardiac Enzymes No results for input(s): CKMB, TROPONINI, MYOGLOBIN in the last 168 hours.  Invalid input(s): CK ------------------------------------------------------------------------------------------------------------------    Component Value Date/Time   BNP 1,971.8 (H) 12/14/2017 2023    Inpatient Medications  Scheduled Meds: . sodium chloride   Intravenous Once  . amiodarone  200 mg Oral Daily  . [START ON 07/20/2018] Chlorhexidine Gluconate Cloth  6 each Topical Q0600  . isosorbide mononitrate  60 mg Oral Daily  . pravastatin  40 mg Oral Daily  . sevelamer carbonate  800 mg Oral TID WC  . vancomycin  500 mg Intravenous Q T,Th,Sa-HD   Continuous Infusions: . ceFEPime (MAXIPIME) IV Stopped (07/19/18 0042)  . metronidazole 500 mg (07/19/18 0545)   PRN Meds:.acetaminophen **OR** acetaminophen, carvedilol, HYDROcodone-acetaminophen,  ondansetron **OR** ondansetron (ZOFRAN) IV, ondansetron, polyvinyl alcohol  Micro Results No results found for this or any previous visit (from the past 240 hour(s)).  Radiology Reports Ct Pelvis Wo Contrast  Result Date: 07/18/2018 CLINICAL DATA:  Right hip pain. Fall Saturday with bruising and swelling. Pelvic and lower abdominal trauma, lower urinary tract trauma suspected. EXAM: CT PELVIS WITHOUT CONTRAST TECHNIQUE: Multidetector CT imaging of the pelvis was performed following the standard protocol without intravenous contrast. COMPARISON:  Pelvic radiograph earlier this day. Abdominopelvic CT 05/27/2018 FINDINGS: Urinary Tract: Urinary bladder partially distended. No bladder wall thickening. Distal ureters are decompressed. Bowel: Colonic diverticulosis. No acute bowel abnormality of pelvic bowel loops. Vascular/Lymphatic: Dense vascular calcification of pelvic vasculature. No adenopathy. Reproductive:  No adnexal mass.  Atrophic uterus, normal for age. Other: Subcutaneous heterogeneous high density lesion in the right perineum measuring 6.9 x 5.1 x 5.8 cm with surrounding stranding, most consistent with hematoma. There is no free fluid in the pelvis. Musculoskeletal: Acute nondisplaced right superior pubic ramus fracture, image 51 series 6. Suspected nondisplaced right sacral fracture with presacral edema/hematoma, fracture line not well characterized given osteopenia. Interval callus formation/healing about the known left greater trochanteric fracture from prior  CT. Suspect nondisplaced left parasymphyseal pubic body fracture, age indeterminate. Subcutaneous stranding about the right posterior upper thigh and right lateral hip. Mild stranding about the lower anterior abdomen. IMPRESSION: 1. Acute nondisplaced right superior pubic ramus fracture. Suspected nondisplaced right sacral fracture with fracture not well visualized, however there is presacral edema/hematoma. 2. Large 6.9 cm right  perineal/inferior gluteal subcutaneous hematoma with associated stranding of the adjacent subcutaneous tissues. Additional stranding/edema of the lower anterior abdominal wall and lateral right hip. 3. Healing left greater trochanteric fracture. Suspected nondisplaced left parasymphyseal pubic body fracture which is age indeterminate. Electronically Signed   By: Keith Rake M.D.   On: 07/18/2018 23:47   Dg Pelvis Portable  Result Date: 07/18/2018 CLINICAL DATA:  Bruising in the perineum after fall on Saturday. EXAM: PORTABLE PELVIS 1-2 VIEWS COMPARISON:  CT pelvis 05/27/2018 FINDINGS: Redemonstration of nondisplaced left greater trochanteric fracture. Moderate joint space narrowing of hips. No additional proximal femoral fracture is identified. Slight cortical irregularity suggested of the left superior pubic ramus with faint linear lucencies about the left parasymphysis. Although findings may simply reflect overlapping bowel simulating fractures, CT may help for better assessment. There is mild soft tissue swelling about the hips, right greater than left. IMPRESSION: 1. Nondisplaced known left greater trochanteric fracture. 2. Slight cortical irregularity of the left superior pubic ramus with faint linear lucencies about the left parasymphysis. Although findings may reflect overlapping bowel simulating fractures, CT may help for better assessment based on clinical concern. Electronically Signed   By: Ashley Royalty M.D.   On: 07/18/2018 22:54   Dg Chest Port 1 View  Result Date: 07/18/2018 CLINICAL DATA:  Pain after fall on Saturday EXAM: PORTABLE CHEST 1 VIEW COMPARISON:  CT thoracic spine 02/14/2018 FINDINGS: Stable cardiomegaly with aortic atherosclerosis. No overt pulmonary edema, effusion or contusion. Vascular calcifications project over the apices bilaterally. Left-sided pacemaker apparatus projects over the upper left axilla with leads in the right atrium and right ventricle. No acute pulmonary  consolidation. Single level kyphoplasty at T8. Remote right-sided fifth and sixth rib fractures. No acute displaced rib fracture. Osteoarthritis of the acromioclavicular and glenohumeral joints. IMPRESSION: 1. Stable cardiomegaly with aortic atherosclerosis. No acute pulmonary disease. 2. No acute osseous abnormality. Electronically Signed   By: Ashley Royalty M.D.   On: 07/18/2018 22:58    Time Spent in minutes  : No charge   Phillips Climes M.D on 07/19/2018 at 1:04 PM  Between 7am to 7pm - Pager - [phone]  After 7pm go to [email or web address] - password [facility]  [facility] -  Office  [phone]

## 2018-07-19 NOTE — Consult Note (Signed)
Renal Service Consult Note [facility]  Valerie Terrell 07/19/2018 [name] Requesting Physician: Dr Waldron Labs   Reason for Consult:  ESRD pt w/ fall and pelvic fracture HPI: The patient is a 85 y.o. year-old w/ hx of CVA, SSS/ PPM, atrial fib, chron resp failrue on home O2, anemia , HTN and ESRD on HD TTS at [facility].  Pt presented to ED yesterday 3/2. She fell down on Sat 2/29, went to HD that day and 'was able to make it through". Got weaker daily so was brought to ED.  Severe pain in sacral area. CT showed fx's of R sup pubic ramus and likely R sacral fracture as well. Also large 6.9cm hematoma perineal area. Pt was admitted. Asked to see for esrd.   Pt started HD in last 2018, no specific issues w/ HD.  Some days are good and some are not.  No AVG issues recently.    Good appetite, no n/v/d, no SOB, cough or CP.    ROS  denies CP  no joint pain   no HA  no blurry vision  no rash  no diarrhea  no nausea/ vomiting   Past Medical History  Past Medical History:  Diagnosis Date  . Anemia of chronic disease 2018   + anemia of CRI?  [name] Atrial fibrillation ([place]) [I48.91] 12/11/2016  . Atrophy of left kidney    with absent blood flow by renal artery dopplers (Dr. Gwenlyn Found)  . 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 Chronic combined systolic and diastolic CHF (congestive heart failure) ([place])    a. 11/2016: echo showing EF of 35-40%, RV strain noted, mild MR and mild TR.   Echo showed much improved EF 12/2017 (55-60%)  . Chronic respiratory failure with hypoxia ([place]) 02/11/2017   Now on chronic O2 (intermittent as of summer 2019).    . Debilitated patient    WC dependent as of 2018  . ESRD on hemodialysis ([place]) 01/2017   T/Th/Sat schedule- [place].  Right basilic AV fistula 91/4782--NFA unable to successfully access this.  Dr. Donnetta Hutching to do an upper arm AV graft as of 10/2017..  . History of adenomatous polyp of colon   . History of kidney stones     passed  . History of subarachnoid hemorrhage 10/2014   after syncope and while on xarelto  . HOH (hard of hearing)   . Hyperlipidemia   . Hypertension    Difficult to control, in the setting of one functioning kidney: pt was referred to nephrology by Dr. Gwenlyn Found 06/2015.  . Lumbar radiculopathy 2012  . Lumbar spinal stenosis 2019   facet injections only very short term relief.  L1 and L2 selective nerve root blocks helpful 04/2018.  . Lumbar spondylosis    MR 07/2016---no sign of spinal nerve compression or cord compression.  Pt set up with outpt ortho while admitted to hosp 07/2016.  . Malnourished ([place])   . Metatarsal fracture 06/10/2016   Nondisplaced, left 5th metatarsal--pt was referred to ortho  . Nonischemic cardiomyopathy ([place])    EF improved to 55-60% on echo 12/2017  . Osteopenia 2014   T-score -2.1  . PAF (paroxysmal atrial fibrillation) (HCC)    Eliquis started after BRAO and CVA.  She was changed to warfarin 2018--INR/coumadin mgmt per HD/nephrol.  . Presence of permanent cardiac pacemaker   . Right rib fracture 12/2016   s/p fall  . Sick sinus syndrome ([place]) 11/2016   Dual chamber pacer  insertion 2018 (Dr. Lovena Le)  . Stroke ([place]) 19/6222   cardioembolic (had CVA while on no anticoag)--"scattered subacute punctate infarcts: 1 in R parietal lobe and 2 in occipital cortex" on MRI br.  CT angio head/neck: aortic arch athero.  R ICA 20% stenosis, L ICA w/out any stenosis.  . Thoracic back pain 01/2017; 01/2018   2018: Facet?  Dr. Ramos->steroid injection.  01/2018-->CT T spine to check for a new comp fx-->none found.  Selective nerve root inj in T spine helpful on R side.   . Thoracic compression fracture ([place]) 07/2016   T3.  T7 and T8-- T8 kyphoplasty during hosp admission 07/2016.  Neuro referred pt to pain mgmt for consideration of injection 09/2016.  I referred her to endo 08/2016 for consideration of calcitonin treatment.   Past Surgical History  Past Surgical History:   Procedure Laterality Date  . APPENDECTOMY  child  . AV FISTULA PLACEMENT Right 03/31/2017   Procedure: Right ARM Basilic vein transposition;  Surgeon: Conrad Nettleton, MD;  Location: [facility] OR;  Service: Vascular;  Laterality: Right;  . AV FISTULA PLACEMENT Right 12/06/2017   Procedure: INSERTION OF ARTERIOVENOUS (AV) GORE-TEX GRAFT ARM RIGHT UPPER EXTREMITY;  Surgeon: [name], MD;  Location: [place];  Service: Vascular;  Laterality: Right;  . CARDIOVASCULAR STRESS TEST  01/31/2016   Stress myoview: NORMAL/Low risk.  EF 56%.  . [place]  . COLONOSCOPY  2015   + hx of adenomatous polyps.  Need digest health spec in [place] records to see when pt due for next colonoscopy  . EYE SURGERY Bilateral    Catarct  . INSERTION OF DIALYSIS CATHETER Right 01/29/2017   Procedure: INSERTION OF DIALYSIS CATHETER- RIGHT INTERNAL JUGULAR;  Surgeon: [name], MD;  Location: [facility] OR;  Service: Vascular;  Laterality: Right;  . IR GENERIC HISTORICAL  07/24/2016   IR KYPHO THORACIC WITH BONE BIOPSY 07/24/2016 Luanne Bras, MD [facility]-INTERV RAD  . KYPHOPLASTY  07/27/2016   T8  . PACEMAKER IMPLANT N/A 12/03/2016   Procedure: Pacemaker Implant;  Surgeon: Evans Lance, MD;  Location: [place] [facility];  Service: Cardiovascular;  Laterality: N/A;  . Renal artery dopplers  02/26/2016   Her right renal dimension was 11 cm pole to pole with mild to moderate right renal artery stenosis. A right renal aortic ratio was 3.22 suggesting less than a 50% stenosis.  . Right upper arm AV Gore-Tex graft  12/06/2017   Dr. Donnetta Hutching  . TONSILLECTOMY    . TRANSTHORACIC ECHOCARDIOGRAM  01/29/2016; 11/2016; 12/2017   2017: EF 55-60%, normal LV wall motion, grade I DD.  No cardiac source of emboli was seen.  2018: EF 35-40%, could not assess DD due to a-fib, pulm HTN noted. 12/2017 EF 55-60%, nl wall motion, grd II DD, mod MR and pulm regurg, increased pulm pressure (peak 52).   Family History  Family History  Problem  Relation Age of Onset  . Cancer Mother   . Heart disease Father   . Early death Father   . Sudden Cardiac Death Neg Hx    Social History  reports that she quit smoking about 46 years ago. She quit after 22.00 years of use. She has never used smokeless tobacco. She reports current alcohol use of about 1.0 standard drinks of alcohol per week. She reports that she does not use drugs. Allergies  Allergies  Allergen Reactions  . Clonidine Derivatives Palpitations and Other (See Comments)    VERY SEDATED  .  Adhesive [Tape] Other (See Comments)    Tears skin up  . Codeine Nausea Only  . Nickel Rash  . Sulfa Antibiotics Other (See Comments)    Reaction unknown   Home medications Prior to Admission medications   Medication Sig Start Date End Date Taking? Authorizing Provider  acetaminophen (TYLENOL) 500 MG tablet Take 500-1,000 mg 3 (three) times daily as needed by mouth for moderate pain.    Yes [provider]  amiodarone (PACERONE) 200 MG tablet TAKE 1 TABLET BY MOUTH  DAILY Patient taking differently: Take 200 mg by mouth daily.  04/11/18  Yes Lorretta Harp, MD  carvedilol (COREG) 3.125 MG tablet TAKE 1 TABLET BY MOUTH TWO  TIMES DAILY Patient taking differently: Take 3.125 mg by mouth 2 (two) times daily as needed (blood pressure).  06/27/18  Yes Lorretta Harp, MD  HYDROcodone-acetaminophen (NORCO) 5-325 MG tablet Take 1-2 tablets by mouth every 6 (six) hours as needed for moderate pain or severe pain. 06/03/18  Yes McGowen, Adrian Blackwater, MD  isosorbide mononitrate (IMDUR) 60 MG 24 hr tablet TAKE 1 TABLET BY MOUTH  DAILY Patient taking differently: Take 60 mg by mouth daily.  03/09/18  Yes McGowen, Adrian Blackwater, MD  ondansetron (ZOFRAN) 4 MG tablet Take 1 tablet (4 mg total) by mouth every 8 (eight) hours as needed for nausea or vomiting. 06/03/18  Yes McGowen, Adrian Blackwater, MD  polyvinyl alcohol (LIQUIFILM TEARS) 1.4 % ophthalmic solution Place 1-2 drops into both eyes as needed for dry  eyes.   Yes [provider]  pravastatin (PRAVACHOL) 40 MG tablet TAKE 1 TABLET BY MOUTH  EVERY EVENING Patient taking differently: Take 40 mg by mouth daily.  03/09/18  Yes McGowen, Adrian Blackwater, MD  sevelamer carbonate (RENVELA) 800 MG tablet Take 800 mg by mouth 3 (three) times daily with meals.    Yes [provider]  vitamin C (ASCORBIC ACID) 500 MG tablet Take 500 mg by mouth daily.   Yes [provider]  Vitamin D, Ergocalciferol, (DRISDOL) 1.25 MG (50000 UT) CAPS capsule Take 1 capsule (50,000 Units total) by mouth every Thursday. 04/28/18  Yes McGowen, Adrian Blackwater, MD  warfarin (COUMADIN) 3 MG tablet TAKE 1/2 TO 1 TABLET BY  MOUTH DAILY AS DIRECTED BY  COUMADIN CLINIC Patient taking differently: Take 1.5-3 mg by mouth See admin instructions. Take 3 mg on Tuesday and Thursday and 1.5 mg all other days of the week. 12/24/17  Yes Lorretta Harp, MD   Liver Function Tests Recent Labs  Lab 07/18/18 2236  AST 17  ALT 9  ALKPHOS 59  BILITOT 0.5  PROT 5.8*  ALBUMIN 2.6*   No results for input(s): LIPASE, AMYLASE in the last 168 hours. CBC Recent Labs  Lab 07/18/18 2236 07/19/18 0326  WBC 11.0* 10.9*  NEUTROABS 8.3*  --   HGB 8.0* 7.9*  HCT 25.5* 24.7*  MCV 101.2* 100.4*  PLT 173 097   Basic Metabolic Panel Recent Labs  Lab 07/18/18 2236 07/19/18 0326  NA 134* 137  K 5.3* 4.8  CL 91* 94*  CO2 29 27  GLUCOSE 108* 106*  BUN 31* 34*  CREATININE 5.64* 5.79*  CALCIUM 8.3* 8.3*   Iron/TIBC/Ferritin/ %Sat    Component Value Date/Time   IRON 60 06/23/2018   TIBC 193 06/23/2018   FERRITIN 516 06/10/2017   IRONPCTSAT 44 (H) 01/14/2017 1150    Vitals:   07/19/18 0259 07/19/18 0513 07/19/18 0907 07/19/18 0931  BP: (!) 130/46 [name])  155/47 (!) 108/35   Pulse: 80 81 82   Resp: 18 18    Temp: (!) 97.1 F (36.2 C) 98.2 F (36.8 C)  97.7 F (36.5 C)  TempSrc: Oral Oral    SpO2: 92% 92% (!) 83%   Weight: 57.4 kg     Height: 4\' 11"  (1.499 m)       Exam Gen alert, elderly WF , chron ill no distres, pleasant No rash, cyanosis or gangrene Sclera anicteric, throat clear  No jvd or bruits Chest clear bilat to bases RRR no MRG Abd soft ntnd no mass or ascites +bs GU defer MS no joint effusions or deformity Ext no LE or UE edema, no wounds or ulcers Neuro is alert, Ox 3 , nf  AVG RUA +bruit   Home meds:  - carvedilol 3.125 bid/ amiodarone 200 qd  - isosorbide mononitrate 60 qd/ pravastatin 40 qd  - sevelamer carb ac tid  - warfarin qd as direct  - prn's / vitamins    NW TTS  4h  52kg  350/ 600   2/2 bath  RUA AVG  Hep 1400  - venofer 50 /wk  - hect 4 ug     Assessment: 1. Fall/ pelvic+ sacral fractures/ perineal hematoma 2. Fevers - started on IV abx , cx's pending 3. Anemia of ckd / ABL  4. ESRD HD TTS. Hold heparin for now given hematoma 5. PAF per primary, stable 6. HTN cont coreg 7. Volume no excess on exam, up by wts 8. MBD ckd cont meds 9. PPM 10. CM - last EF 35-40%    P: 1. HD today w/ 2u prbc's, UF 2-3 kg      [facility] 07/19/2018, 12:16 PM

## 2018-07-20 DIAGNOSIS — S3023XD Contusion of vagina and vulva, subsequent encounter: Secondary | ICD-10-CM

## 2018-07-20 LAB — TYPE AND SCREEN
ABO/RH(D): B POS
Antibody Screen: NEGATIVE
Unit division: 0
Unit division: 0

## 2018-07-20 LAB — BPAM RBC
BLOOD PRODUCT EXPIRATION DATE: 202003312359
Blood Product Expiration Date: 202003312359
ISSUE DATE / TIME: 202003031610
ISSUE DATE / TIME: 202003031610
Unit Type and Rh: 7300
Unit Type and Rh: 7300

## 2018-07-20 LAB — CBC
HCT: 33.4 % — ABNORMAL LOW (ref 36.0–46.0)
Hemoglobin: 11.1 g/dL — ABNORMAL LOW (ref 12.0–15.0)
MCH: 30.7 pg (ref 26.0–34.0)
MCHC: 33.2 g/dL (ref 30.0–36.0)
MCV: 92.5 fL (ref 80.0–100.0)
PLATELETS: 144 10*3/uL — AB (ref 150–400)
RBC: 3.61 MIL/uL — ABNORMAL LOW (ref 3.87–5.11)
RDW: 17.5 % — AB (ref 11.5–15.5)
WBC: 11.7 10*3/uL — ABNORMAL HIGH (ref 4.0–10.5)
nRBC: 0 % (ref 0.0–0.2)

## 2018-07-20 LAB — PROTIME-INR
INR: 1.1 (ref 0.8–1.2)
Prothrombin Time: 13.9 seconds (ref 11.4–15.2)

## 2018-07-20 MED ORDER — CHLORHEXIDINE GLUCONATE CLOTH 2 % EX PADS
6.0000 | MEDICATED_PAD | Freq: Every day | CUTANEOUS | Status: DC
  Administered 2018-07-21 – 2018-07-22 (×2): 6 via TOPICAL
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
## 2018-07-20 NOTE — Evaluation (Signed)
Occupational Therapy Evaluation Patient Details Name: Valerie Terrell MRN: [id number] DOB: 1933-01-30 Today's Date: 07/20/2018    History of Present Illness The patient is a 85 y.o. year-old w/ hx of CVA, SSS/ PPM, atrial fib, chron resp failrue on home O2, anemia , HTN and ESRD on HD TTS at [facility].  Pt presented to ED 3/2. She fell down on Sat 2/29, went to HD that day and 'was able to make it through". Got weaker daily so was brought to ED.  Severe pain in sacral area. CT showed fx's of R sup pubic ramus and likely R sacral fracture as well. Also large 6.9cm hematoma perineal area.   Clinical Impression   PT admitted with see above. Pt currently with functional limitiations due to the deficits listed below (see OT problem list). Pt currently fall risk and requires min (A) with RW for safety. Pt limited with LB adls.  Pt will benefit from skilled OT to increase their independence and safety with adls and balance to allow discharge SNF.     Follow Up Recommendations  SNF    Equipment Recommendations  3 in 1 bedside commode;Other (comment)(RW)    Recommendations for Other Services       Precautions / Restrictions Precautions Precautions: Fall Restrictions Weight Bearing Restrictions: Yes RLE Weight Bearing: Weight bearing as tolerated LLE Weight Bearing: Weight bearing as tolerated      Mobility Bed Mobility Overal bed mobility: Needs Assistance Bed Mobility: Supine to Sit     Supine to sit: Min assist     General bed mobility comments: Slow moving, but overall minguard assist; min assist to steady upper trunk with scooting hips to EOB  Transfers Overall transfer level: Needs assistance Equipment used: Rolling walker (2 wheeled) Transfers: Sit to/from Stand Sit to Stand: Min guard         General transfer comment: Noted dependence on UE push on UEs to push up from  armrests, and to pull up on grab bar to stand from commode    Balance Overall balance assessment:  Needs assistance   Sitting balance-Leahy Scale: Fair       Standing balance-Leahy Scale: Fair                             ADL either performed or assessed with clinical judgement   ADL Overall ADL's : Needs assistance/impaired Eating/Feeding: Independent   Grooming: Supervision/safety;Standing               Lower Body Dressing: Moderate assistance   Toilet Transfer: Supervision/safety;RW   Toileting- Clothing Manipulation and Hygiene: Supervision/safety;Sit to/from stand       Functional mobility during ADLs: Minimal assistance;Rolling walker       Vision         Perception     Praxis      Pertinent Vitals/Pain Pain Assessment: No/denies pain Faces Pain Scale: Hurts a little bit Pain Location: sacrum, low back, once sitting in recliner for approx 10-15 minutes Pain Descriptors / Indicators: Grimacing(but pt states no pain) Pain Intervention(s): Monitored during session;Repositioned     Hand Dominance Right   Extremity/Trunk Assessment Upper Extremity Assessment Upper Extremity Assessment: Overall WFL for tasks assessed   Lower Extremity Assessment Lower Extremity Assessment: Generalized weakness   Cervical / Trunk Assessment Cervical / Trunk Assessment: Kyphotic Cervical / Trunk Exceptions: Noted uncomfortable with sitting in recliner   Communication Communication Communication: HOH(hearing aides)   Cognition Arousal/Alertness: Awake/alert Behavior  During Therapy: WFL for tasks assessed/performed Overall Cognitive Status: Within Functional Limits for tasks assessed(for simple mobility tasks)                                     General Comments  concern for ability to d/c home and need incr (A).     Exercises     Shoulder Instructions      Home Living Family/patient expects to be discharged to:: Private residence Living Arrangements: Children Available Help at Discharge: Family(Daughter works from home) Type  of Home: House Home Access: [place]: One level     Bathroom Shower/Tub: Teacher, early years/pre: Standard Bathroom Accessibility: Yes   Home Equipment: Bedside commode;Walker - 2 wheels;Cane - single point;Walker - 4 wheels          Prior Functioning/Environment Level of Independence: Independent with assistive device(s)        Comments: Uses RW versus cane; per pt, "Depending on circumstances"        OT Problem List: Decreased strength;Decreased activity tolerance;Impaired balance (sitting and/or standing);Decreased safety awareness;Decreased knowledge of use of DME or AE;Decreased knowledge of precautions;Pain      OT Treatment/Interventions: Self-care/ADL training;Therapeutic exercise;Neuromuscular education;Energy conservation;DME and/or AE instruction;Manual therapy;Modalities;Therapeutic activities;Cognitive remediation/compensation;Patient/family education;Balance training    OT Goals(Current goals can be found in the care plan section) Acute Rehab OT Goals Patient Stated Goal: Would rather go home OT Goal Formulation: With patient Time For Goal Achievement: 08/03/18 Potential to Achieve Goals: Good  OT Frequency: Min 2X/week   Barriers to D/C: Decreased caregiver support          Co-evaluation              AM-PAC OT "6 Clicks" Daily Activity     Outcome Measure Help from another person eating meals?: None Help from another person taking care of personal grooming?: A Little Help from another person toileting, which includes using toliet, bedpan, or urinal?: A Little Help from another person bathing (including washing, rinsing, drying)?: A Lot Help from another person to put on and taking off regular upper body clothing?: A Little Help from another person to put on and taking off regular lower body clothing?: A Lot 6 Click Score: 17   End of Session Equipment Utilized During Treatment: Gait belt;Rolling walker Nurse  Communication: Mobility status;Precautions  Activity Tolerance: Patient tolerated treatment well Patient left: Other (comment)(with PT Holly in the hall)  OT Visit Diagnosis: Unsteadiness on feet (R26.81)                Time: 7408-1448 OT Time Calculation (min): 36 min Charges:  OT General Charges $OT Visit: 1 Visit OT Evaluation $OT Eval Moderate Complexity: 1 Mod   Jeri Modena, OTR/L  Acute Rehabilitation Services Pager: [phone] Office: [phone] .   Jeri Modena 07/20/2018, 3:31 PM

## 2018-07-20 NOTE — Clinical Social Work Note (Signed)
Clinical Social Work Assessment  Patient Details  Name: Valerie Terrell MRN: [id number] Date of Birth: 15-Oct-1932  Date of referral:  07/20/18               Reason for consult:  Discharge Planning                Permission sought to share information with:  Case Manager, Facility Sport and exercise psychologist, Family Supports Permission granted to share information::  Yes, Verbal Permission Granted  Name::     Ambulance person::  SNFs  Relationship::  daughter  Contact Information:  [phone]  Housing/Transportation Living arrangements for the past 2 months:  [place] of Information:  Patient Patient Interpreter Needed:  None Criminal Activity/Legal Involvement Pertinent to Current Situation/Hospitalization:  No - Comment as needed Significant Relationships:  Adult Children Lives with:  Self Do you feel safe going back to the place where you live?  No Need for family participation in patient care:  Yes (Comment)  Care giving concerns:  CSW received referral for possible SNF placement at time of discharge. Spoke with patient regarding possibility of SNF placement . Patient's family   is currently unable to care for her at their home given patient's current needs and fall risk.  Patient  expressed understanding of PT recommendation and are agreeable to SNF placement at time of discharge. CSW to continue to follow and assist with discharge planning needs.     Social Worker assessment / plan:  Spoke with patient concerning possibility of rehab at SNF before returning home.    Employment status:  Retired Nurse, adult PT Recommendations:  [place] / Referral to community resources:  [place]  Patient/Family's Response to care:  Patient recognizes need for rehab before returning home and are agreeable to a SNF in [place]. They report preference for  [facility]. Possible barriers to placement include  patient's dialysis on Tues, Thurs and Saturday in [place] on [address] [facility] and lack of transportation. CSW explained insurance authorization process. Patient's family reported that they want patient to get stronger to be able to come back home.    Patient/Family's Understanding of and Emotional Response to Diagnosis, Current Treatment, and Prognosis:  Patient/family is realistic regarding therapy needs and expressed being hopeful for SNF placement. Patient expressed understanding of CSW role and discharge process as well as medical condition. No questions/concerns about plan or treatment.    Emotional Assessment Appearance:  Appears stated age Attitude/Demeanor/Rapport:  Gracious Affect (typically observed):  Accepting Orientation:  Oriented to Self, Oriented to Place, Oriented to  Time, Oriented to Situation Alcohol / Substance use:  Not Applicable Psych involvement (Current and /or in the community):  No (Comment)  Discharge Needs  Concerns to be addressed:  Discharge Planning Concerns Readmission within the last 30 days:  No Current discharge risk:  Dependent with Mobility Barriers to Discharge:  Continued Medical Work up   [employer], [place] 07/20/2018, 3:54 PM

## 2018-07-20 NOTE — NC FL2 (Signed)
[place] LEVEL OF CARE SCREENING TOOL     IDENTIFICATION  Patient Name: Valerie Terrell Birthdate: February 01, 1933 Sex: female Admission Date (Current Location): 07/18/2018  [facility] and [place] Number:  Herbalist and Address:  [facility], [place] [address] [place], [place] [address]      Provider Number: [id number]  Attending Physician Name and Address:  Domenic Polite, MD  Relative Name and Phone Number:  [name] (daughter) [phone]    Current Level of Care: Hospital Recommended Level of Care: [place] Prior Approval Number:    Date Approved/Denied: 01/20/17 PASRR Number: [id number]  Discharge Plan: SNF    Current Diagnoses: Patient Active Problem List   Diagnosis Date Noted  . Fracture of pubic ramus ([place]) 07/19/2018  . Fracture of sacrum ([place]) 07/19/2018  . Perineal hematoma 07/19/2018  . Fever 07/19/2018  . Acute blood loss anemia 07/19/2018  . ESRD (end stage renal disease) on dialysis ([place]) 05/20/2018  . Chronic back pain 05/14/2018  . Chronic pain syndrome 05/14/2018  . Hypoxia 12/14/2017  . Dyspnea 12/14/2017  . Degeneration of thoracic intervertebral disc 07/26/2017  . Acute respiratory failure ([place]) 02/13/2017  . Hyperkalemia 02/13/2017  . ESRD on dialysis ([place]) 02/11/2017  . Chronic respiratory failure with hypoxia ([place]) 02/11/2017  . Debilitated patient 02/07/2017  . Poor tolerance for ambulation 02/07/2017  . Fracture of one rib, right side, initial encounter for closed fracture 01/11/2017  . Closed fracture of one rib of right side   . Chronic combined systolic and diastolic heart failure ([place]) 01/05/2017  . Palpitations 12/15/2016  . Hypokalemia 12/14/2016  . Leukocytosis 12/14/2016  . Long term (current) use of anticoagulants [Z79.01] 12/11/2016  . Non-ischemic cardiomyopathy ([employer]) 12/07/2016  . Acute combined systolic and diastolic heart failure ([place])  12/07/2016  . Cardiac  pacemaker   . Acute on chronic respiratory failure with hypoxia ([place])   . Acute pulmonary edema with congestive heart failure ([place]) 11/30/2016  . Elevated troponin level 11/30/2016  . Anemia 11/30/2016  . Chronic midline thoracic back pain 09/15/2016  . History of CVA (cerebrovascular accident) 09/15/2016  . Hyperlipidemia   . Nausea & vomiting 08/05/2016  . Dehydration 08/05/2016  . Osteoporosis 07/28/2016  . Closed compression fracture of thoracic vertebra ([place]) 07/23/2016  . Thoracic compression fracture ([place]) 07/22/2016  . Flank pain 07/11/2016  . Hypoalbuminemia 07/11/2016  . Renal artery stenosis ([place]) 06/24/2016  . Nondisplaced fracture of fifth metatarsal bone, left foot, initial encounter for closed fracture 06/10/2016  . Rib contusion, left, initial encounter 06/10/2016  . Single kidney 03/13/2016  . Chest pain   . Dyslipidemia   . PAF (paroxysmal atrial fibrillation) ([place]) 01/28/2016  . Essential hypertension 01/28/2016  . History of cardioembolic stroke 84/53/6468  . Personal history of subarachnoid hemorrhage 01/28/2016  . CKD (chronic kidney disease), stage IV ([place]) 01/28/2016  . Gait disturbance 01/27/2016  . Mitral valve disease 01/08/2014  . Colon polyps 07/10/2013  . Lumbar radiculopathy 02/04/2011    Orientation RESPIRATION BLADDER Height & Weight     Time, Situation, Place, Self  Normal Continent Weight: 55.7 kg Height:  4\' 11"  (149.9 cm)  BEHAVIORAL SYMPTOMS/MOOD NEUROLOGICAL BOWEL NUTRITION STATUS      Continent Diet(see discharge summary)  AMBULATORY STATUS COMMUNICATION OF NEEDS Skin   Limited Assist Verbally Other (Comment)(ecchymosis on pelvis, labia, coccyx, sacrum, buttocks and arm)  Personal Care Assistance Level of Assistance  Bathing, Feeding, Dressing, Total care Bathing Assistance: Independent Feeding assistance: Independent Dressing Assistance: Independent Total Care Assistance: Limited assistance    Functional Limitations Info  Sight, Hearing, Speech Sight Info: Adequate Hearing Info: Adequate Speech Info: Adequate    SPECIAL CARE FACTORS FREQUENCY  PT (By licensed PT), OT (By licensed OT)     PT Frequency: min 5x weekly OT Frequency: min 5x weekly            Contractures Contractures Info: Not present    Additional Factors Info  Code Status, Allergies Code Status Info: full Allergies Info: Clonidine Derivatives, Adhesive (tape), Codeine, Nickel, Sulfa Antibiotics           Current Medications (07/20/2018):  This is the current hospital active medication list Current Facility-Administered Medications  Medication Dose Route Frequency Provider Last Rate Last Dose  . 0.9 %  sodium chloride infusion (Manually program via Guardrails IV Fluids)   Intravenous Once Elgergawy, Silver Huguenin, MD      . acetaminophen (TYLENOL) tablet 650 mg  650 mg Oral Q6H PRN Etta Quill, DO   650 mg at 07/20/18 1228   Or  . acetaminophen (TYLENOL) suppository 650 mg  650 mg Rectal Q6H PRN Etta Quill, DO      . amiodarone (PACERONE) tablet 200 mg  200 mg Oral Daily Jennette Kettle M, DO   200 mg at 07/20/18 5732  . carvedilol (COREG) tablet 3.125 mg  3.125 mg Oral BID PRN Etta Quill, DO      . ceFEPIme (MAXIPIME) 1 g in sodium chloride 0.9 % 100 mL IVPB  1 g Intravenous Q24H Romona Curls, RPH 200 mL/hr at 07/20/18 0019 1 g at 07/20/18 0019  . Chlorhexidine Gluconate Cloth 2 % PADS 6 each  6 each Topical Q0600 Roney Jaffe, MD      . Derrill Memo ON 07/21/2018] Chlorhexidine Gluconate Cloth 2 % PADS 6 each  6 each Topical Q0600 Roney Jaffe, MD      . HYDROcodone-acetaminophen (NORCO/VICODIN) 5-325 MG per tablet 1-2 tablet  1-2 tablet Oral Q6H PRN Etta Quill, DO   2 tablet at 07/19/18 [phone]  . isosorbide mononitrate (IMDUR) 24 hr tablet 60 mg  60 mg Oral Daily Etta Quill, DO   60 mg at 07/20/18 4270  . metroNIDAZOLE (FLAGYL) IVPB 500 mg  500 mg Intravenous Q8H Harris,  Abigail, PA-C 100 mL/hr at 07/20/18 1353 500 mg at 07/20/18 1353  . ondansetron (ZOFRAN) tablet 4 mg  4 mg Oral Q6H PRN Etta Quill, DO       Or  . ondansetron [facility]) injection 4 mg  4 mg Intravenous Q6H PRN Etta Quill, DO      . ondansetron [facility]) tablet 4 mg  4 mg Oral Q8H PRN Etta Quill, DO      . polyvinyl alcohol (LIQUIFILM TEARS) 1.4 % ophthalmic solution 1-2 drop  1-2 drop Both Eyes PRN Etta Quill, DO      . pravastatin (PRAVACHOL) tablet 40 mg  40 mg Oral Daily Jennette Kettle M, DO   40 mg at 07/20/18 6237  . sevelamer carbonate (RENVELA) tablet 800 mg  800 mg Oral TID WC Jennette Kettle M, DO   800 mg at 07/20/18 1231  . vancomycin (VANCOCIN) IVPB 500 mg/100 ml premix  500 mg Intravenous Q T,Th,Sa-HD Romona Curls, RPH 100 mL/hr at 07/19/18 1902 500 mg at 07/19/18 1902  Discharge Medications: Please see discharge summary for a list of discharge medications.  Relevant Imaging Results:  Relevant Lab Results:   Additional Information SSN: [id number]  Alberteen Sam, LCSW

## 2018-07-20 NOTE — Evaluation (Addendum)
Physical Therapy Evaluation Patient Details Name: Valerie Terrell MRN: [id number] DOB: [birth year] Today's Date: 07/20/2018   History of Present Illness  The patient is a 85 y.o. year-old w/ hx of CVA, SSS/ PPM, atrial fib, chron resp failrue on home O2, anemia , HTN and ESRD on HD TTS at [facility].  Pt presented to ED 3/2. She fell down on Sat 2/29, went to HD that day and 'was able to make it through". Got weaker daily so was brought to ED.  Severe pain in sacral area. CT showed fx's of R sup pubic ramus and likely R sacral fracture as well. Also large 6.9cm hematoma perineal area.  Clinical Impression   Pt admitted with above diagnosis. Pt currently with functional limitations due to the deficits listed below (see PT Problem List). Admitted from home, lives with daughter who works from home; tells me she uses cane and/or RW "as needed"; Present with decr functional mobility, decr activity tolerance, decr sitting tolerance, increased fall risk; Pt will benefit from skilled PT to increase their independence and safety with mobility to allow discharge to the venue listed below.    Lenghty discussion re: dc planning with pt and daughter, Valerie Terrell voices concern over her mother discharging home "too soon" given recent falls, and the potential for to feel weak post HD on top of that; We discussed a short-[facility] stay at [facility] for post-acute rehabilitation, and Valerie Terrell indicated that Valerie Terrell typically refuses SNF; during this discussion, Valerie Terrell told me she was open to going for short-term rehab, and at this point, it is worth pursuing, even if just to find out SNF options.     Follow Up Recommendations SNF;Other (comment)(If pt declines SNF (which sounds likely), rec HHPT, HHAide, some form of shower seat -- will defer bathroom equipment to OT)    Equipment Recommendations  Other (comment)(youth-sized RW)    Recommendations for Other Services OT consult(as ordered)     Precautions /  Restrictions Precautions Precautions: Fall Restrictions Weight Bearing Restrictions: Yes RLE Weight Bearing: Weight bearing as tolerated LLE Weight Bearing: Weight bearing as tolerated      Mobility  Bed Mobility Overal bed mobility: Needs Assistance Bed Mobility: Supine to Sit     Supine to sit: Min assist     General bed mobility comments: Slow moving, but overall minguard assist; min assist to steady upper trunk with scooting hips to EOB  Transfers Overall transfer level: Needs assistance Equipment used: Rolling walker (2 wheeled) Transfers: Sit to/from Stand Sit to Stand: Min guard         General transfer comment: Noted dependence on UE push on UEs to push up from  armrests, and to pull up on grab bar to stand from commode  Ambulation/Gait Ambulation/Gait assistance: Min guard Gait Distance (Feet): 50 Feet Assistive device: Rolling walker (2 wheeled) Gait Pattern/deviations: Step-through pattern;Decreased step length - right;Decreased step length - left Gait velocity: slow   General Gait Details: Cues to self-monitor for activity tolerance; slow moving, but steady; put a youth-sized RW in the room at end of session for better fit  Stairs            Wheelchair Mobility    Modified Rankin (Stroke Patients Only)       Balance Overall balance assessment: Needs assistance   Sitting balance-Leahy Scale: Fair       Standing balance-Leahy Scale: Fair  Pertinent Vitals/Pain Pain Assessment: No/denies pain Faces Pain Scale: Hurts a little bit Pain Location: sacrum, low back, once sitting in recliner for approx 10-15 minutes Pain Descriptors / Indicators: Grimacing(but pt states no pain) Pain Intervention(s): Monitored during session    Home Living Family/patient expects to be discharged to:: Private residence Living Arrangements: Children Available Help at Discharge: Family(Daughter works from home) Type  of Home: House Home Access: Ramped entrance     Home Layout: One level Home Equipment: Bedside commode;Walker - 2 wheels;Cane - single point;Walker - 4 wheels      Prior Function Level of Independence: Independent with assistive device(s)         Comments: Uses RW versus cane; per pt, "Depending on circumstances"     Hand Dominance   Dominant Hand: Right    Extremity/Trunk Assessment   Upper Extremity Assessment Upper Extremity Assessment: Defer to OT evaluation    Lower Extremity Assessment Lower Extremity Assessment: Generalized weakness    Cervical / Trunk Assessment Cervical / Trunk Assessment: Other exceptions Cervical / Trunk Exceptions: Noted uncomfortable with sitting in recliner  Communication   Communication: HOH(hearing aides)  Cognition Arousal/Alertness: Awake/alert Behavior During Therapy: WFL for tasks assessed/performed Overall Cognitive Status: Within Functional Limits for tasks assessed(for simple mobility tasks)                                        General Comments General comments (skin integrity, edema, etc.): Lengthy discussion re: dc plans with pt and daughter at end of session    Exercises     Assessment/Plan    PT Assessment Patient needs continued PT services  PT Problem List Decreased strength;Decreased range of motion;Decreased activity tolerance;Decreased balance;Decreased mobility;Decreased knowledge of use of DME;Pain       PT Treatment Interventions DME instruction;Gait training;Functional mobility training;Therapeutic activities;Therapeutic exercise;Balance training;Neuromuscular re-education;Patient/family education    PT Goals (Current goals can be found in the Care Plan section)  Acute Rehab PT Goals Patient Stated Goal: Would rather go home PT Goal Formulation: With patient Time For Goal Achievement: 08/03/18 Potential to Achieve Goals: Good    Frequency Min 5X/week   Barriers to discharge Other  (comment) Daughter, Valerie Terrell, arrived at the end of session, and voiced concern over her mother being dc'd too soon, and concern over going home    Co-evaluation               AM-PAC PT "6 Clicks" Mobility  Outcome Measure Help needed turning from your back to your side while in a flat bed without using bedrails?: None Help needed moving from lying on your back to sitting on the side of a flat bed without using bedrails?: A Little Help needed moving to and from a bed to a chair (including a wheelchair)?: None Help needed standing up from a chair using your arms (e.g., wheelchair or bedside chair)?: A Little Help needed to walk in hospital room?: A Little Help needed climbing 3-5 steps with a railing? : A Little 6 Click Score: 20    End of Session Equipment Utilized During Treatment: Gait belt Activity Tolerance: Patient tolerated treatment well Patient left: in chair;with call bell/phone within reach;with chair alarm set   PT Visit Diagnosis: Unsteadiness on feet (R26.81);Other abnormalities of gait and mobility (R26.89)    Time: 7322-0254 PT Time Calculation (min) (ACUTE ONLY): 36 min   Charges:   PT Evaluation $PT  Eval Moderate Complexity: [place], [place]  Acute Rehabilitation Services Pager [phone] Office [phone]   Valerie Terrell 07/20/2018, 1:02 PM

## 2018-07-20 NOTE — Progress Notes (Signed)
[facility] Progress Note  Subjective: no new c/o's.  Pain in bottom is improving, less swollen. Got HD last night w/ prbc's.   Vitals:   07/19/18 1730 07/19/18 1820 07/20/18 0647 07/20/18 0648  BP: 135/81 (!) 146/63 (!) 133/55 (!) 131/50  Pulse: 93 87 74 72  Resp: (!) 23 16 18 18   Temp: (!) 97.5 F (36.4 C) (!) 97.5 F (36.4 C) 97.6 F (36.4 C)   TempSrc: Oral Oral Oral   SpO2: (!) 89% 93% 92% 92%  Weight: 55.7 kg     Height:        Inpatient medications: . sodium chloride   Intravenous Once  . amiodarone  200 mg Oral Daily  . Chlorhexidine Gluconate Cloth  6 each Topical Q0600  . isosorbide mononitrate  60 mg Oral Daily  . pravastatin  40 mg Oral Daily  . sevelamer carbonate  800 mg Oral TID WC  . vancomycin  500 mg Intravenous Q T,Th,Sa-HD   . ceFEPime (MAXIPIME) IV 1 g (07/20/18 0019)  . metronidazole 500 mg (07/20/18 0650)   acetaminophen **OR** acetaminophen, carvedilol, HYDROcodone-acetaminophen, ondansetron **OR** ondansetron (ZOFRAN) IV, ondansetron, polyvinyl alcohol  Iron/TIBC/Ferritin/ %Sat    Component Value Date/Time   IRON 60 06/23/2018   TIBC 193 06/23/2018   FERRITIN 516 06/10/2017   IRONPCTSAT 44 (H) 01/14/2017 1150    Exam: Gen alert, elderly WF , chron ill no distress No jvd or bruits Chest clear bilat to bases RRR no MRG Abd soft ntnd no mass or ascites +bs Ext no edema or wounds Neuro is alert, Ox 3 , nf  AVG RUA +bruit   Home meds:  - carvedilol 3.125 bid/ amiodarone 200 qd  - isosorbide mononitrate 60 qd/ pravastatin 40 qd  - sevelamer carb ac tid  - warfarin qd as direct  - prn's / vitamins    NW TTS  4h  52kg  350/ 600   2/2 bath  RUA AVG  Hep 1400  - venofer 50 /wk  - hect 4 ug   Assessment: 1. Fall/ pelvic & sacral fractures/ perineal hematoma - per primary 2. Fevers - started on IV abx , blood cx's neg, per primary 3. Anemia of ckd / ABL - sp prbc x 2 on 3/3. Hb today 11.1.  4. ESRD HD TTS. Hold  dialysis heparin for now w/ hematoma.  5. PAF per primary, stable, coumadin on hold 6. HTN cont coreg 7. Volume no excess on exam, up 2-3 by wts 8. MBD ckd cont meds 9. PPM 10. CM - last EF 35-40%  Plan: HD Thursday   [facility] 07/20/2018, 12:58 PM  Recent Labs  Lab 07/18/18 2236 07/19/18 0326 07/20/18 0424  NA 134* 137  --   K 5.3* 4.8  --   CL 91* 94*  --   CO2 29 27  --   GLUCOSE 108* 106*  --   BUN 31* 34*  --   CREATININE 5.64* 5.79*  --   CALCIUM 8.3* 8.3*  --   ALBUMIN 2.6*  --   --   INR 2.8*  --  1.1   Recent Labs  Lab 07/18/18 2236  AST 17  ALT 9  ALKPHOS 59  BILITOT 0.5  PROT 5.8*   Recent Labs  Lab 07/18/18 2236 07/19/18 0326 07/19/18 1921 07/20/18 0424  WBC 11.0* 10.9*  --  11.7*  NEUTROABS 8.3*  --   --   --   HGB 8.0* 7.9*  11.7* 11.1*  HCT 25.5* 24.7* 36.0 33.4*  MCV 101.2* 100.4*  --  92.5  PLT 173 167  --  144*

## 2018-07-20 NOTE — Care Management Note (Signed)
Case Management Note  Patient Details  Name: Loula Marcella MRN: [id number] Date of Birth: [birth year]  Subjective/Objective:                    Action/Plan:  Spoke w patient at bedside. She states that she lives at home w her daughter. Her daughter runs a small business from home and there "most of the time" but is not able to provide 24 hour supervision. Patient states that she has RW WC 3/1. Patient states that her daughter would not be able to care for her, and she is interested in going to rehab before she gets home. CM will notify CSW.  Expected Discharge Date:                  Expected Discharge Plan:     In-House Referral:  Clinical Social Work  Discharge planning Services  CM Consult  Post Acute Care Choice:    Choice offered to:     DME Arranged:    DME Agency:     HH Arranged:    HH Agency:     Status of Service:  In process, will continue to follow  If discussed at Long Length of Stay Meetings, dates discussed:    Additional Comments:  Carles Collet, RN 07/20/2018, 2:05 PM

## 2018-07-20 NOTE — Plan of Care (Signed)

## 2018-07-20 NOTE — Progress Notes (Addendum)
PROGRESS NOTE                                                                                                                                                                                                             Patient Demographics:    Valerie Terrell, is a 85 y.o. female, DOB - 02/15/1933, HOZ:[id number]  Admit date - 07/18/2018   Admitting Physician Etta Quill, DO  Outpatient Primary MD for the patient is McGowen, Adrian Blackwater, MD  LOS - 1   Chief Complaint  Patient presents with  . Fall       Brief Narrative   85 y.o. female with medical history significant of ESRD on dialysis TTS.  Patient presents for pelvic pain following a fall on Sat.  She was able to walk, however her daughter states that it was with significant difficulty.She was able to get through her dialysis treatment on Saturday. She was very weak today and totally unable to ambulate at all today. Her daughter was helping her change her adult diaper and noticed bruising on her abdomen. Patient is complaining of severe pain in her perineum. She states that she was feeding the cat when she fell over and onto her bottom. She also has severe pain today in her sacrum. She denies leg weakness. She is on coumadin   Subjective:   feels ok this morning, no dyspnea, pain  At surgical site   Assessment  & Plan :   Pelvic fracture -Including right superior pubic ramus and sacral ala -Following mechanical fall -Appreciate orthopedics consult -Recommended weightbearing as tolerated, physical therapy eval - pain control  Large perineal hematoma -CT noted Large 6.9 cm right perineal/inferior gluteal subcutaneous hematoma  -In the setting of pelvic fracture fall and chronic anticoagulation with Coumadin -I do not think the hematoma is infected, fever of 101 on admission, afebrile since -Discontinue antibiotics tomorrow -Coumadin held and received vitamin K 5 mg IV on admission to  reverse coagulopathy -Supportive care, weightbearing as tolerated  Acute blood loss anemia and anemia of chronic disease -Secondary to large hematoma, status post 2 units of PRBC -Monitor hemoglobin, hold Coumadin -hb 11 this morning  Paroxysmal atrial fibrillation -Coumadin held as discussed above, will need to hold this for at least 2 to 3 weeks -Continue amiodarone  ESRD on hemodialysis Tuesday Thursday Saturday -Renal  following  Code Status : Full Family Communication  : No family at bedside Disposition Plan  : May need short-term rehab, await PT eval  Consults  :  Ortho, renal  Procedures  : None  DVT Prophylaxis  : SCD  Lab Results  Component Value Date   PLT 144 (L) 07/20/2018    Antibiotics  :    Anti-infectives (From admission, onward)   Start     Dose/Rate Route Frequency Ordered Stop   07/19/18 1200  vancomycin (VANCOCIN) IVPB 500 mg/100 ml premix     500 mg 100 mL/hr over 60 Minutes Intravenous Every T-Th-Sa (Hemodialysis) 07/18/18 2226     07/18/18 2230  ceFEPIme (MAXIPIME) 2 g in sodium chloride 0.9 % 100 mL IVPB  Status:  Discontinued     2 g 200 mL/hr over 30 Minutes Intravenous  Once 07/18/18 2216 07/18/18 2224   07/18/18 2230  metroNIDAZOLE (FLAGYL) IVPB 500 mg     500 mg 100 mL/hr over 60 Minutes Intravenous Every 8 hours 07/18/18 2216     07/18/18 2230  vancomycin (VANCOCIN) IVPB 1000 mg/200 mL premix  Status:  Discontinued     1,000 mg 200 mL/hr over 60 Minutes Intravenous  Once 07/18/18 2216 07/18/18 2224   07/18/18 2230  ceFEPIme (MAXIPIME) 1 g in sodium chloride 0.9 % 100 mL IVPB     1 g 200 mL/hr over 30 Minutes Intravenous Every 24 hours 07/18/18 2224     07/18/18 2230  vancomycin (VANCOCIN) 1,250 mg in sodium chloride 0.9 % 250 mL IVPB     1,250 mg 166.7 mL/hr over 90 Minutes Intravenous  Once 07/18/18 2224 07/19/18 0142        Objective:   Vitals:   07/19/18 1730 07/19/18 1820 07/20/18 0647 07/20/18 0648  BP: 135/81 (!) 146/63 (!)  133/55 (!) 131/50  Pulse: 93 87 74 72  Resp: (!) 23 16 18 18   Temp: (!) 97.5 F (36.4 C) (!) 97.5 F (36.4 C) 97.6 F (36.4 C)   TempSrc: Oral Oral Oral   SpO2: (!) 89% 93% 92% 92%  Weight: 55.7 kg     Height:        Wt Readings from Last 3 Encounters:  07/19/18 55.7 kg  07/06/18 54.4 kg  05/27/18 54.9 kg     Intake/Output Summary (Last 24 hours) at 07/20/2018 9628 Last data filed at 07/20/2018 0600 Gross per 24 hour  Intake 1189.75 ml  Output 1870 ml  Net -680.25 ml     Physical Exam Gen: Awake, Alert, Oriented X 3, chronically ill-appearing, no distress HEENT: PERRLA, Neck supple, no JVD Lungs: Good air movement bilaterally, CTAB CVS: RRR,No Gallops,Rubs or new Murmurs Abd: soft, Non tender, non distended, BS present, bruising noted in the right groin, right flank and gluteal region Extremities: No Cyanosis, Clubbing or edema Skin: Bruising as noted above    Data Review:    CBC Recent Labs  Lab 07/18/18 2236 07/19/18 0326 07/19/18 1921 07/20/18 0424  WBC 11.0* 10.9*  --  11.7*  HGB 8.0* 7.9* 11.7* 11.1*  HCT 25.5* 24.7* 36.0 33.4*  PLT 173 167  --  144*  MCV 101.2* 100.4*  --  92.5  MCH 31.7 32.1  --  30.7  MCHC 31.4 32.0  --  33.2  RDW 14.4 14.5  --  17.5*  LYMPHSABS 0.8  --   --   --   MONOABS 1.8*  --   --   --   EOSABS 0.0  --   --   --  BASOSABS 0.0  --   --   --     Chemistries  Recent Labs  Lab 07/18/18 2236 07/19/18 0326  NA 134* 137  K 5.3* 4.8  CL 91* 94*  CO2 29 27  GLUCOSE 108* 106*  BUN 31* 34*  CREATININE 5.64* 5.79*  CALCIUM 8.3* 8.3*  AST 17  --   ALT 9  --   ALKPHOS 59  --   BILITOT 0.5  --    ------------------------------------------------------------------------------------------------------------------ No results for input(s): CHOL, HDL, LDLCALC, TRIG, CHOLHDL, LDLDIRECT in the last 72 hours.  Lab Results  Component Value Date   HGBA1C 5.2 12/15/2016    ------------------------------------------------------------------------------------------------------------------ No results for input(s): TSH, T4TOTAL, T3FREE, THYROIDAB in the last 72 hours.  Invalid input(s): FREET3 ------------------------------------------------------------------------------------------------------------------ No results for input(s): VITAMINB12, FOLATE, FERRITIN, TIBC, IRON, RETICCTPCT in the last 72 hours.  Coagulation profile Recent Labs  Lab 07/15/18 1332 07/18/18 2236 07/20/18 0424  INR 1.9* 2.8* 1.1    No results for input(s): DDIMER in the last 72 hours.  Cardiac Enzymes No results for input(s): CKMB, TROPONINI, MYOGLOBIN in the last 168 hours.  Invalid input(s): CK ------------------------------------------------------------------------------------------------------------------    Component Value Date/Time   BNP 1,971.8 (H) 12/14/2017 2023    Inpatient Medications  Scheduled Meds: . sodium chloride   Intravenous Once  . amiodarone  200 mg Oral Daily  . Chlorhexidine Gluconate Cloth  6 each Topical Q0600  . isosorbide mononitrate  60 mg Oral Daily  . pravastatin  40 mg Oral Daily  . sevelamer carbonate  800 mg Oral TID WC  . vancomycin  500 mg Intravenous Q T,Th,Sa-HD   Continuous Infusions: . ceFEPime (MAXIPIME) IV 1 g (07/20/18 0019)  . metronidazole 500 mg (07/20/18 0650)   PRN Meds:.acetaminophen **OR** acetaminophen, carvedilol, HYDROcodone-acetaminophen, ondansetron **OR** ondansetron (ZOFRAN) IV, ondansetron, polyvinyl alcohol  Micro Results Recent Results (from the past 240 hour(s))  Blood Culture (routine x 2)     Status: None (Preliminary result)   Collection Time: 07/18/18 10:50 PM  Result Value Ref Range Status   Specimen Description BLOOD LEFT ANTECUBITAL  Final   Special Requests   Final    BOTTLES DRAWN AEROBIC AND ANAEROBIC Blood Culture adequate volume   Culture   Final    NO GROWTH < 24 HOURS Performed at [facility], [place] [address] [place], [place] [address]    Report Status PENDING  Incomplete    Radiology Reports Ct Pelvis Wo Contrast  Result Date: 07/18/2018 CLINICAL DATA:  Right hip pain. Fall Saturday with bruising and swelling. Pelvic and lower abdominal trauma, lower urinary tract trauma suspected. EXAM: CT PELVIS WITHOUT CONTRAST TECHNIQUE: Multidetector CT imaging of the pelvis was performed following the standard protocol without intravenous contrast. COMPARISON:  Pelvic radiograph earlier this day. Abdominopelvic CT 05/27/2018 FINDINGS: Urinary Tract: Urinary bladder partially distended. No bladder wall thickening. Distal ureters are decompressed. Bowel: Colonic diverticulosis. No acute bowel abnormality of pelvic bowel loops. Vascular/Lymphatic: Dense vascular calcification of pelvic vasculature. No adenopathy. Reproductive:  No adnexal mass.  Atrophic uterus, normal for age. Other: Subcutaneous heterogeneous high density lesion in the right perineum measuring 6.9 x 5.1 x 5.8 cm with surrounding stranding, most consistent with hematoma. There is no free fluid in the pelvis. Musculoskeletal: Acute nondisplaced right superior pubic ramus fracture, image 51 series 6. Suspected nondisplaced right sacral fracture with presacral edema/hematoma, fracture line not well characterized given osteopenia. Interval callus formation/healing about the known left greater trochanteric fracture from prior CT.  Suspect nondisplaced left parasymphyseal pubic body fracture, age indeterminate. Subcutaneous stranding about the right posterior upper thigh and right lateral hip. Mild stranding about the lower anterior abdomen. IMPRESSION: 1. Acute nondisplaced right superior pubic ramus fracture. Suspected nondisplaced right sacral fracture with fracture not well visualized, however there is presacral edema/hematoma. 2. Large 6.9 cm right perineal/inferior gluteal subcutaneous hematoma with associated stranding of the  adjacent subcutaneous tissues. Additional stranding/edema of the lower anterior abdominal wall and lateral right hip. 3. Healing left greater trochanteric fracture. Suspected nondisplaced left parasymphyseal pubic body fracture which is age indeterminate. Electronically Signed   By: Keith Rake M.D.   On: 07/18/2018 23:47   Dg Pelvis Portable  Result Date: 07/18/2018 CLINICAL DATA:  Bruising in the perineum after fall on Saturday. EXAM: PORTABLE PELVIS 1-2 VIEWS COMPARISON:  CT pelvis 05/27/2018 FINDINGS: Redemonstration of nondisplaced left greater trochanteric fracture. Moderate joint space narrowing of hips. No additional proximal femoral fracture is identified. Slight cortical irregularity suggested of the left superior pubic ramus with faint linear lucencies about the left parasymphysis. Although findings may simply reflect overlapping bowel simulating fractures, CT may help for better assessment. There is mild soft tissue swelling about the hips, right greater than left. IMPRESSION: 1. Nondisplaced known left greater trochanteric fracture. 2. Slight cortical irregularity of the left superior pubic ramus with faint linear lucencies about the left parasymphysis. Although findings may reflect overlapping bowel simulating fractures, CT may help for better assessment based on clinical concern. Electronically Signed   By: Ashley Royalty M.D.   On: 07/18/2018 22:54   Dg Chest Port 1 View  Result Date: 07/18/2018 CLINICAL DATA:  Pain after fall on Saturday EXAM: PORTABLE CHEST 1 VIEW COMPARISON:  CT thoracic spine 02/14/2018 FINDINGS: Stable cardiomegaly with aortic atherosclerosis. No overt pulmonary edema, effusion or contusion. Vascular calcifications project over the apices bilaterally. Left-sided pacemaker apparatus projects over the upper left axilla with leads in the right atrium and right ventricle. No acute pulmonary consolidation. Single level kyphoplasty at T8. Remote right-sided fifth and sixth rib  fractures. No acute displaced rib fracture. Osteoarthritis of the acromioclavicular and glenohumeral joints. IMPRESSION: 1. Stable cardiomegaly with aortic atherosclerosis. No acute pulmonary disease. 2. No acute osseous abnormality. Electronically Signed   By: Ashley Royalty M.D.   On: 07/18/2018 22:58    Time Spent in minutes  :28min   Domenic Polite M.D on 07/20/2018 at 9:21 AM [facility] -  Office  [phone]

## 2018-07-21 LAB — RENAL FUNCTION PANEL
Albumin: 2.3 g/dL — ABNORMAL LOW (ref 3.5–5.0)
Anion gap: 12 (ref 5–15)
BUN: 32 mg/dL — ABNORMAL HIGH (ref 8–23)
CO2: 25 mmol/L (ref 22–32)
Calcium: 8.2 mg/dL — ABNORMAL LOW (ref 8.9–10.3)
Chloride: 97 mmol/L — ABNORMAL LOW (ref 98–111)
Creatinine, Ser: 4.58 mg/dL — ABNORMAL HIGH (ref 0.44–1.00)
GFR calc Af Amer: 10 mL/min — ABNORMAL LOW (ref >60)
GFR calc non Af Amer: 8 mL/min — ABNORMAL LOW (ref >60)
Glucose, Bld: 112 mg/dL — ABNORMAL HIGH (ref 70–99)
POTASSIUM: 3.6 mmol/L (ref 3.5–5.1)
Phosphorus: 4.7 mg/dL — ABNORMAL HIGH (ref 2.5–4.6)
Sodium: 134 mmol/L — ABNORMAL LOW (ref 135–145)

## 2018-07-21 LAB — PROTIME-INR
INR: 1 (ref 0.8–1.2)
Prothrombin Time: 13.1 seconds (ref 11.4–15.2)

## 2018-07-21 MED ORDER — ACETAMINOPHEN 325 MG PO TABS
ORAL_TABLET | ORAL | Status: AC
  Filled 2018-07-21: qty 2

## 2018-07-21 NOTE — Progress Notes (Addendum)
[facility] Progress Note   Subjective:   Patient seen in room. Ongoing R hip/pelvic pain but thinks it is improving. S/p PRBC on 07/19/2018, Hgb now 11.7. Mild SOB today. Denies CP/palpitations, abdominal pain, N/V/D. Planned for HD today.   Objective Vitals:   07/20/18 1513 07/20/18 1955 07/21/18 0417 07/21/18 0556  BP: (!) 116/49 104/63 (!) 141/54   Pulse: 74 72 72   Resp: 17 15 16    Temp: 97.9 F (36.6 C) 98.2 F (36.8 C) 98 F (36.7 C)   TempSrc: Oral Oral Oral   SpO2: 97% 97% 96%   Weight:    56.4 kg  Height:       Physical Exam General: Well developed, elderly appearing female. Alert and in NAD. Heart: RRR, no murmurs, rubs or gallops auscultated Lungs: CTA bilaterally without wheezing, rhonchi or rales Abdomen: Soft, non-tender, non-distended. Normoactive BS. No palpable masses Extremities: Large hematoma R pelvis extending to back, tender. No peripheral edema b/l Dialysis Access: RUE AVF, + thrill  Additional Objective Labs: Basic Metabolic Panel: Recent Labs  Lab 07/18/18 2236 07/19/18 0326  NA 134* 137  K 5.3* 4.8  CL 91* 94*  CO2 29 27  GLUCOSE 108* 106*  BUN 31* 34*  CREATININE 5.64* 5.79*  CALCIUM 8.3* 8.3*   Liver Function Tests: Recent Labs  Lab 07/18/18 2236  AST 17  ALT 9  ALKPHOS 59  BILITOT 0.5  PROT 5.8*  ALBUMIN 2.6*   CBC: Recent Labs  Lab 07/18/18 2236 07/19/18 0326 07/19/18 1921 07/20/18 0424  WBC 11.0* 10.9*  --  11.7*  NEUTROABS 8.3*  --   --   --   HGB 8.0* 7.9* 11.7* 11.1*  HCT 25.5* 24.7* 36.0 33.4*  MCV 101.2* 100.4*  --  92.5  PLT 173 167  --  144*   Blood Culture    Component Value Date/Time   SDES BLOOD LEFT ANTECUBITAL 07/18/2018 2250   SPECREQUEST  07/18/2018 2250    BOTTLES DRAWN AEROBIC AND ANAEROBIC Blood Culture adequate volume   CULT  07/18/2018 2250    NO GROWTH 2 DAYS Performed at [facility], [place] [address] [place], [place] [address]    REPTSTATUS PENDING 07/18/2018 2250     Medications:  . amiodarone  200 mg Oral Daily  . Chlorhexidine Gluconate Cloth  6 each Topical Q0600  . isosorbide mononitrate  60 mg Oral Daily  . pravastatin  40 mg Oral Daily  . sevelamer carbonate  800 mg Oral TID WC    Dialysis Orders: [facility] TTS 4h 52kg 350/ 600 2/2 bath RUA AVG Hep 1400 - venofer 50 /wk - hect 4 ug  Assessment/Plan: 1. Pelvic/sacral fracture, perineal hematoma: Feels pain is improving, getting PT. Hgb now stable s/p PRBC. Plan appears to be for SNF at discharge. Further management per ortho/primary.  2. Fevers: Initially presented with fever, question of infected hematoma. S/p course of vancomycin, flagyl, cefepime. Currently afebrile. Management per primary.  3. ESRD: Continue MWF schedule. K 4.8. No heparin for now due to hematoma. Planned for HD today. 4. HTN/volume:  BP overall at goal. Above EDW by 4.4kg. Mild SOB, no edema on exam. To have dialysis today with UF goal 2-2.5L as BP tolerates.  5. Anemia: S/p 2 units PRBC on 3/3. Hgb now 11.7. Follow. 6. Secondary hyperparathyroidism:  Ca 8.3, corrected 9.4. Will restart outpatient dose of hectorol 4mg  TIW, follow Ca. Continue outpatient binder.  7. Nutrition:  Albumin 2.6. Renal diet with fluid  restrictions.  8. Paroxysmal a.fib: Management per primary, coumadin on hold.   Anice Paganini, PA-C 07/21/2018, 12:16 PM  [facility] Pager: [phone]  Pt seen, examined and agree w A/P as above.  [facility] 07/21/2018, 2:37 PM

## 2018-07-21 NOTE — Progress Notes (Signed)
Physical Therapy Treatment Patient Details Name: Valerie Terrell MRN: [id number] DOB: 1933-05-01 Today's Date: 07/21/2018    History of Present Illness The patient is a 85 y.o. year-old w/ hx of CVA, SSS/ PPM, atrial fib, chron resp failrue on home O2, anemia , HTN and ESRD on HD TTS at [facility].  Pt presented to ED 3/2. She fell down on Sat 2/29, went to HD that day and 'was able to make it through". Got weaker daily so was brought to ED.  Severe pain in sacral area. CT showed fx's of R sup pubic ramus and likely R sacral fracture as well. Also large 6.9cm hematoma perineal area.    PT Comments    Patient progressing well towards PT goals. Improved ambulation distance today with close min guard for balance/safety with a need for Min A when taking hands off RW or trying to carry things once in room. Cues for RW safety/management. Continues to have pain with movement esp with bed mobility and sitting for a period of time. Pt fall risk due to above. Continue to recommend SNF to maximize independence and mobility as pt lives alone. Will follow.    Follow Up Recommendations  SNF     Equipment Recommendations  Other (comment)(youth sized RW)    Recommendations for Other Services       Precautions / Restrictions Precautions Precautions: Fall Restrictions Weight Bearing Restrictions: Yes RLE Weight Bearing: Weight bearing as tolerated LLE Weight Bearing: Weight bearing as tolerated    Mobility  Bed Mobility Overal bed mobility: Needs Assistance Bed Mobility: Supine to Sit     Supine to sit: Min guard;HOB elevated     General bed mobility comments: Increased time and use of rail to get to EOB.  Transfers Overall transfer level: Needs assistance Equipment used: Rolling walker (2 wheeled)(youth) Transfers: Sit to/from Stand Sit to Stand: Min guard         General transfer comment: Min guard for safety. Stood from [employer].   Ambulation/Gait Ambulation/Gait assistance: Min  guard;Min assist Gait Distance (Feet): 150 Feet Assistive device: Rolling walker (2 wheeled) Gait Pattern/deviations: Step-through pattern;Decreased step length - right;Decreased step length - left Gait velocity: slow   General Gait Details: Slow, mildly unsteady gait, pt taking hands off RW at times. Cues for safety. Trying to carry things inr oom while trying to use RW -Min A when this occurred.    Stairs             Wheelchair Mobility    Modified Rankin (Stroke Patients Only)       Balance Overall balance assessment: Needs assistance Sitting-balance support: Feet supported;No upper extremity supported Sitting balance-Leahy Scale: Fair     Standing balance support: During functional activity Standing balance-Leahy Scale: Fair                              Cognition Arousal/Alertness: Awake/alert Behavior During Therapy: WFL for tasks assessed/performed Overall Cognitive Status: Within Functional Limits for tasks assessed                                 General Comments: for basic mobility tasks. needs cues to keep hands on RW - trying to carry things and hold onto RW- not safe.       Exercises      General Comments        Pertinent  Vitals/Pain Pain Assessment: 0-10 Pain Score: 2  Pain Location: sacrum and low back with mobility Pain Descriptors / Indicators: Grimacing;Guarding;Sore Pain Intervention(s): Monitored during session;Repositioned    Home Living                      Prior Function            PT Goals (current goals can now be found in the care plan section) Progress towards PT goals: Progressing toward goals    Frequency    Min 5X/week      PT Plan Current plan remains appropriate    Co-evaluation              AM-PAC PT "6 Clicks" Mobility   Outcome Measure  Help needed turning from your back to your side while in a flat bed without using bedrails?: None Help needed moving from lying  on your back to sitting on the side of a flat bed without using bedrails?: A Little Help needed moving to and from a bed to a chair (including a wheelchair)?: None Help needed standing up from a chair using your arms (e.g., wheelchair or bedside chair)?: A Little Help needed to walk in hospital room?: A Little Help needed climbing 3-5 steps with a railing? : A Little 6 Click Score: 20    End of Session Equipment Utilized During Treatment: Gait belt Activity Tolerance: Patient tolerated treatment well Patient left: in chair;with call bell/phone within reach;with chair alarm set Nurse Communication: Mobility status PT Visit Diagnosis: Unsteadiness on feet (R26.81);Other abnormalities of gait and mobility (R26.89)     Time: 2423-5361 PT Time Calculation (min) (ACUTE ONLY): 21 min  Charges:  $Gait Training: 8-22 mins                     Wray Kearns, PT, DPT Acute Rehabilitation Services Pager [phone] Office [place] 07/21/2018, 9:59 AM

## 2018-07-21 NOTE — Plan of Care (Signed)
  Problem: Clinical Measurements: Goal: Ability to maintain clinical measurements within normal limits will improve Outcome: Progressing   Problem: Activity: Goal: Risk for activity intolerance will decrease Outcome: Progressing   Problem: Nutrition: Goal: Adequate nutrition will be maintained Outcome: Progressing   Problem: Elimination: Goal: Will not experience complications related to bowel motility Outcome: Progressing   Problem: Pain Managment: Goal: General experience of comfort will improve Outcome: Progressing   Problem: Skin Integrity: Goal: Risk for impaired skin integrity will decrease Outcome: Progressing

## 2018-07-21 NOTE — Progress Notes (Signed)
PROGRESS NOTE                                                                                                                                                                                                             Patient Demographics:    Valerie Terrell, is a 85 y.o. female, DOB - [birth year], SKA:[id number]  Admit date - 07/18/2018   Admitting Physician Etta Quill, DO  Outpatient Primary MD for the patient is McGowen, Adrian Blackwater, MD  LOS - 2   Chief Complaint  Patient presents with  . Fall       Brief Narrative   85 y.o. female with medical history significant of ESRD on dialysis TTS.  Patient presents for pelvic pain following a fall on Sat. -Patient is on chronic Coumadin for history of A. Fib -Upon work-up in the ER she was noted to have pelvic fractures large perineal hematoma and acute blood loss anemia   Subjective:  -Ambulate a little little better with physical therapy   Assessment  & Plan :   Pelvic fracture -Including right superior pubic ramus and sacral ala -Following mechanical fall -Appreciate orthopedics input -Recommended weightbearing as tolerated, physical therapy eval -Continue physical therapy, pain controlled ideally Tylenol as needed -Social work consult for short-term rehab -Discharge planning  Large perineal hematoma -CT on admission noted Large 6.9 cm right perineal/inferior gluteal subcutaneous hematoma  -In the setting of pelvic fracture fall and chronic anticoagulation with Coumadin -Clinically do not think her hematoma is infected, she was febrile to 101 on admission however afebrile nontoxic without pain or discomfort at the local site  -Antibiotics discontinued  -Coumadin held temporarily given vitamin K on admission to reverse coagulopathy  -Supportive care, weightbearing as tolerated, physical therapy, rehab   Acute blood loss anemia and anemia of chronic disease -Secondary to large hematoma,  status post 2 units of PRBC -Monitor hemoglobin, hold Coumadin -Stable  Paroxysmal atrial fibrillation -Coumadin held as discussed above, will need to hold this for at least 2 to 3 weeks -Discussed with patient and daughter regarding slight increased stroke risk while off anticoagulation -Continue amiodarone  ESRD on hemodialysis Tuesday Thursday Saturday -Renal following  Code Status : Full Family Communication  : No family at bedside Disposition Plan  : Short-term rehab tomorrow  Consults  :  Ortho, renal  Procedures  :  None  DVT Prophylaxis  : SCD  Lab Results  Component Value Date   PLT 144 (L) 07/20/2018    Antibiotics  :    Anti-infectives (From admission, onward)   Start     Dose/Rate Route Frequency Ordered Stop   07/19/18 1200  vancomycin (VANCOCIN) IVPB 500 mg/100 ml premix  Status:  Discontinued     500 mg 100 mL/hr over 60 Minutes Intravenous Every T-Th-Sa (Hemodialysis) 07/18/18 2226 07/21/18 0856   07/18/18 2230  ceFEPIme (MAXIPIME) 2 g in sodium chloride 0.9 % 100 mL IVPB  Status:  Discontinued     2 g 200 mL/hr over 30 Minutes Intravenous  Once 07/18/18 2216 07/18/18 2224   07/18/18 2230  metroNIDAZOLE (FLAGYL) IVPB 500 mg  Status:  Discontinued     500 mg 100 mL/hr over 60 Minutes Intravenous Every 8 hours 07/18/18 2216 07/21/18 1053   07/18/18 2230  vancomycin (VANCOCIN) IVPB 1000 mg/200 mL premix  Status:  Discontinued     1,000 mg 200 mL/hr over 60 Minutes Intravenous  Once 07/18/18 2216 07/18/18 2224   07/18/18 2230  ceFEPIme (MAXIPIME) 1 g in sodium chloride 0.9 % 100 mL IVPB  Status:  Discontinued     1 g 200 mL/hr over 30 Minutes Intravenous Every 24 hours 07/18/18 2224 07/21/18 0856   07/18/18 2230  vancomycin (VANCOCIN) 1,250 mg in sodium chloride 0.9 % 250 mL IVPB     1,250 mg 166.7 mL/hr over 90 Minutes Intravenous  Once 07/18/18 2224 07/19/18 0142        Objective:   Vitals:   07/20/18 1513 07/20/18 1955 07/21/18 0417 07/21/18 0556    BP: (!) 116/49 104/63 (!) 141/54   Pulse: 74 72 72   Resp: 17 15 16    Temp: 97.9 F (36.6 C) 98.2 F (36.8 C) 98 F (36.7 C)   TempSrc: Oral Oral Oral   SpO2: 97% 97% 96%   Weight:    56.4 kg  Height:        Wt Readings from Last 3 Encounters:  07/21/18 56.4 kg  07/06/18 54.4 kg  05/27/18 54.9 kg     Intake/Output Summary (Last 24 hours) at 07/21/2018 1422 Last data filed at 07/20/2018 1513 Gross per 24 hour  Intake 240 ml  Output -  Net 240 ml     Physical Exam Gen: Awake, Alert, Oriented X 3, chronically ill-appearing, no distress HEENT: PERRLA, Neck supple, no JVD Lungs: Clear bilaterally CVS: RRR,No Gallops,Rubs or new Murmurs Abd: Soft, nontender, nondistended, bowel sounds present, bruising in the right groin, right flank and gluteal area  Extremities: No Cyanosis, Clubbing or edema Skin: Bruising as noted above   Data Review:    CBC Recent Labs  Lab 07/18/18 2236 07/19/18 0326 07/19/18 1921 07/20/18 0424  WBC 11.0* 10.9*  --  11.7*  HGB 8.0* 7.9* 11.7* 11.1*  HCT 25.5* 24.7* 36.0 33.4*  PLT 173 167  --  144*  MCV 101.2* 100.4*  --  92.5  MCH 31.7 32.1  --  30.7  MCHC 31.4 32.0  --  33.2  RDW 14.4 14.5  --  17.5*  LYMPHSABS 0.8  --   --   --   MONOABS 1.8*  --   --   --   EOSABS 0.0  --   --   --   BASOSABS 0.0  --   --   --     Chemistries  Recent Labs  Lab 07/18/18 2236 07/19/18 0326 07/21/18  1301  NA 134* 137 134*  K 5.3* 4.8 3.6  CL 91* 94* 97*  CO2 29 27 25   GLUCOSE 108* 106* 112*  BUN 31* 34* 32*  CREATININE 5.64* 5.79* 4.58*  CALCIUM 8.3* 8.3* 8.2*  AST 17  --   --   ALT 9  --   --   ALKPHOS 59  --   --   BILITOT 0.5  --   --    ------------------------------------------------------------------------------------------------------------------ No results for input(s): CHOL, HDL, LDLCALC, TRIG, CHOLHDL, LDLDIRECT in the last 72 hours.  Lab Results  Component Value Date   HGBA1C 5.2 12/15/2016    ------------------------------------------------------------------------------------------------------------------ No results for input(s): TSH, T4TOTAL, T3FREE, THYROIDAB in the last 72 hours.  Invalid input(s): FREET3 ------------------------------------------------------------------------------------------------------------------ No results for input(s): VITAMINB12, FOLATE, FERRITIN, TIBC, IRON, RETICCTPCT in the last 72 hours.  Coagulation profile Recent Labs  Lab 07/15/18 1332 07/18/18 2236 07/20/18 0424 07/21/18 0546  INR 1.9* 2.8* 1.1 1.0    No results for input(s): DDIMER in the last 72 hours.  Cardiac Enzymes No results for input(s): CKMB, TROPONINI, MYOGLOBIN in the last 168 hours.  Invalid input(s): CK ------------------------------------------------------------------------------------------------------------------    Component Value Date/Time   BNP 1,971.8 (H) 12/14/2017 2023    Inpatient Medications  Scheduled Meds: . amiodarone  200 mg Oral Daily  . Chlorhexidine Gluconate Cloth  6 each Topical Q0600  . isosorbide mononitrate  60 mg Oral Daily  . pravastatin  40 mg Oral Daily  . sevelamer carbonate  800 mg Oral TID WC   Continuous Infusions:  PRN Meds:.acetaminophen **OR** acetaminophen, carvedilol, HYDROcodone-acetaminophen, ondansetron **OR** ondansetron (ZOFRAN) IV, ondansetron, polyvinyl alcohol  Micro Results Recent Results (from the past 240 hour(s))  Blood Culture (routine x 2)     Status: None (Preliminary result)   Collection Time: 07/18/18 10:36 PM  Result Value Ref Range Status   Specimen Description BLOOD LEFT HAND  Final   Special Requests   Final    BOTTLES DRAWN AEROBIC AND ANAEROBIC Blood Culture adequate volume   Culture   Final    NO GROWTH 2 DAYS Performed at [facility], [place] [address] [place], [place] [address]    Report Status PENDING  Incomplete  Blood Culture (routine x 2)     Status: None (Preliminary result)    Collection Time: 07/18/18 10:50 PM  Result Value Ref Range Status   Specimen Description BLOOD LEFT ANTECUBITAL  Final   Special Requests   Final    BOTTLES DRAWN AEROBIC AND ANAEROBIC Blood Culture adequate volume   Culture   Final    NO GROWTH 3 DAYS Performed at [facility], [place] [address] [place], [place] [address]    Report Status PENDING  Incomplete    Radiology Reports Ct Pelvis Wo Contrast  Result Date: 07/18/2018 CLINICAL DATA:  Right hip pain. Fall Saturday with bruising and swelling. Pelvic and lower abdominal trauma, lower urinary tract trauma suspected. EXAM: CT PELVIS WITHOUT CONTRAST TECHNIQUE: Multidetector CT imaging of the pelvis was performed following the standard protocol without intravenous contrast. COMPARISON:  Pelvic radiograph earlier this day. Abdominopelvic CT 05/27/2018 FINDINGS: Urinary Tract: Urinary bladder partially distended. No bladder wall thickening. Distal ureters are decompressed. Bowel: Colonic diverticulosis. No acute bowel abnormality of pelvic bowel loops. Vascular/Lymphatic: Dense vascular calcification of pelvic vasculature. No adenopathy. Reproductive:  No adnexal mass.  Atrophic uterus, normal for age. Other: Subcutaneous heterogeneous high density lesion in the right perineum measuring 6.9 x 5.1 x 5.8 cm  with surrounding stranding, most consistent with hematoma. There is no free fluid in the pelvis. Musculoskeletal: Acute nondisplaced right superior pubic ramus fracture, image 51 series 6. Suspected nondisplaced right sacral fracture with presacral edema/hematoma, fracture line not well characterized given osteopenia. Interval callus formation/healing about the known left greater trochanteric fracture from prior CT. Suspect nondisplaced left parasymphyseal pubic body fracture, age indeterminate. Subcutaneous stranding about the right posterior upper thigh and right lateral hip. Mild stranding about the lower anterior abdomen. IMPRESSION: 1. Acute  nondisplaced right superior pubic ramus fracture. Suspected nondisplaced right sacral fracture with fracture not well visualized, however there is presacral edema/hematoma. 2. Large 6.9 cm right perineal/inferior gluteal subcutaneous hematoma with associated stranding of the adjacent subcutaneous tissues. Additional stranding/edema of the lower anterior abdominal wall and lateral right hip. 3. Healing left greater trochanteric fracture. Suspected nondisplaced left parasymphyseal pubic body fracture which is age indeterminate. Electronically Signed   By: Keith Rake M.D.   On: 07/18/2018 23:47   Dg Pelvis Portable  Result Date: 07/18/2018 CLINICAL DATA:  Bruising in the perineum after fall on Saturday. EXAM: PORTABLE PELVIS 1-2 VIEWS COMPARISON:  CT pelvis 05/27/2018 FINDINGS: Redemonstration of nondisplaced left greater trochanteric fracture. Moderate joint space narrowing of hips. No additional proximal femoral fracture is identified. Slight cortical irregularity suggested of the left superior pubic ramus with faint linear lucencies about the left parasymphysis. Although findings may simply reflect overlapping bowel simulating fractures, CT may help for better assessment. There is mild soft tissue swelling about the hips, right greater than left. IMPRESSION: 1. Nondisplaced known left greater trochanteric fracture. 2. Slight cortical irregularity of the left superior pubic ramus with faint linear lucencies about the left parasymphysis. Although findings may reflect overlapping bowel simulating fractures, CT may help for better assessment based on clinical concern. Electronically Signed   By: Ashley Royalty M.D.   On: 07/18/2018 22:54   Dg Chest Port 1 View  Result Date: 07/18/2018 CLINICAL DATA:  Pain after fall on Saturday EXAM: PORTABLE CHEST 1 VIEW COMPARISON:  CT thoracic spine 02/14/2018 FINDINGS: Stable cardiomegaly with aortic atherosclerosis. No overt pulmonary edema, effusion or contusion. Vascular  calcifications project over the apices bilaterally. Left-sided pacemaker apparatus projects over the upper left axilla with leads in the right atrium and right ventricle. No acute pulmonary consolidation. Single level kyphoplasty at T8. Remote right-sided fifth and sixth rib fractures. No acute displaced rib fracture. Osteoarthritis of the acromioclavicular and glenohumeral joints. IMPRESSION: 1. Stable cardiomegaly with aortic atherosclerosis. No acute pulmonary disease. 2. No acute osseous abnormality. Electronically Signed   By: Ashley Royalty M.D.   On: 07/18/2018 22:58    Time Spent in minutes  :23min   Domenic Polite M.D on 07/21/2018 at 2:22 PM [facility] -  Office  [phone]

## 2018-07-22 LAB — PROTIME-INR
INR: 1 (ref 0.8–1.2)
Prothrombin Time: 13.4 seconds (ref 11.4–15.2)

## 2018-07-22 LAB — CBC
HEMATOCRIT: 37.2 % (ref 36.0–46.0)
HEMOGLOBIN: 11.8 g/dL — AB (ref 12.0–15.0)
MCH: 30.3 pg (ref 26.0–34.0)
MCHC: 31.7 g/dL (ref 30.0–36.0)
MCV: 95.4 fL (ref 80.0–100.0)
Platelets: 164 10*3/uL (ref 150–400)
RBC: 3.9 MIL/uL (ref 3.87–5.11)
RDW: 16.5 % — ABNORMAL HIGH (ref 11.5–15.5)
WBC: 8 10*3/uL (ref 4.0–10.5)
nRBC: 0 % (ref 0.0–0.2)

## 2018-07-22 IMAGING — CR DG THORACIC SPINE 2V
3 series · 3 of 3 positions shown · non-contrast
Comparison: 07/22/2016 and 07/24/2016

CLINICAL DATA: Mid thoracic pain for 2 weeks, status post
kyphoplasty

EXAM:
THORACIC SPINE 2 VIEWS

[t-spine ap]
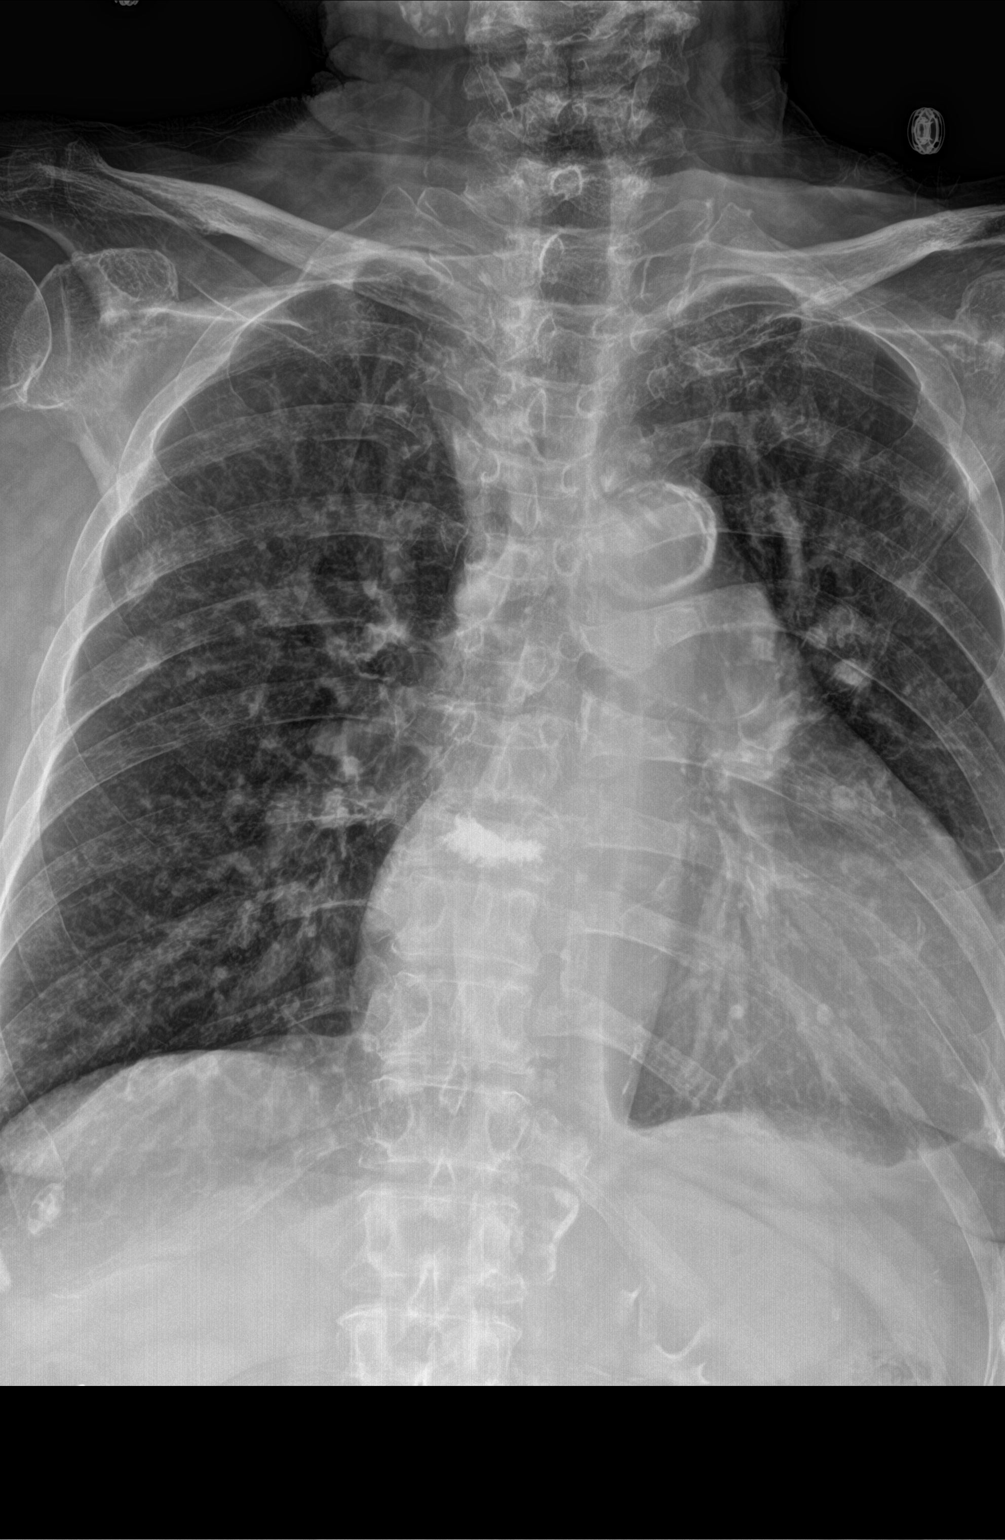

[t-spine lat]
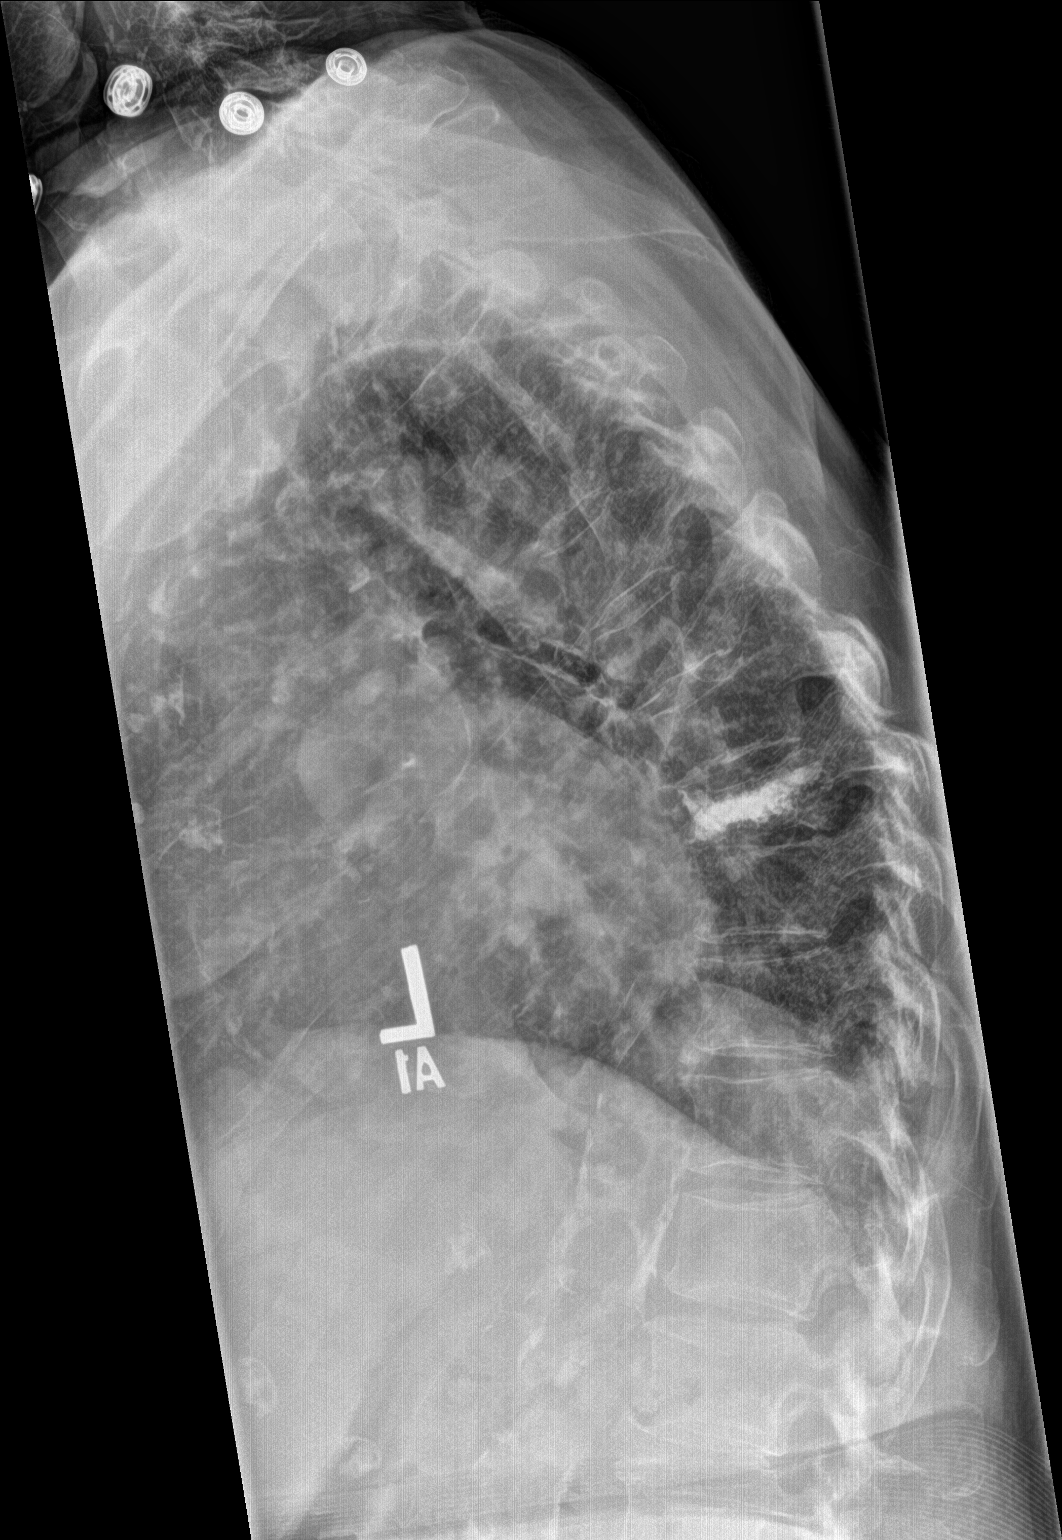

[t-spine swimmers]
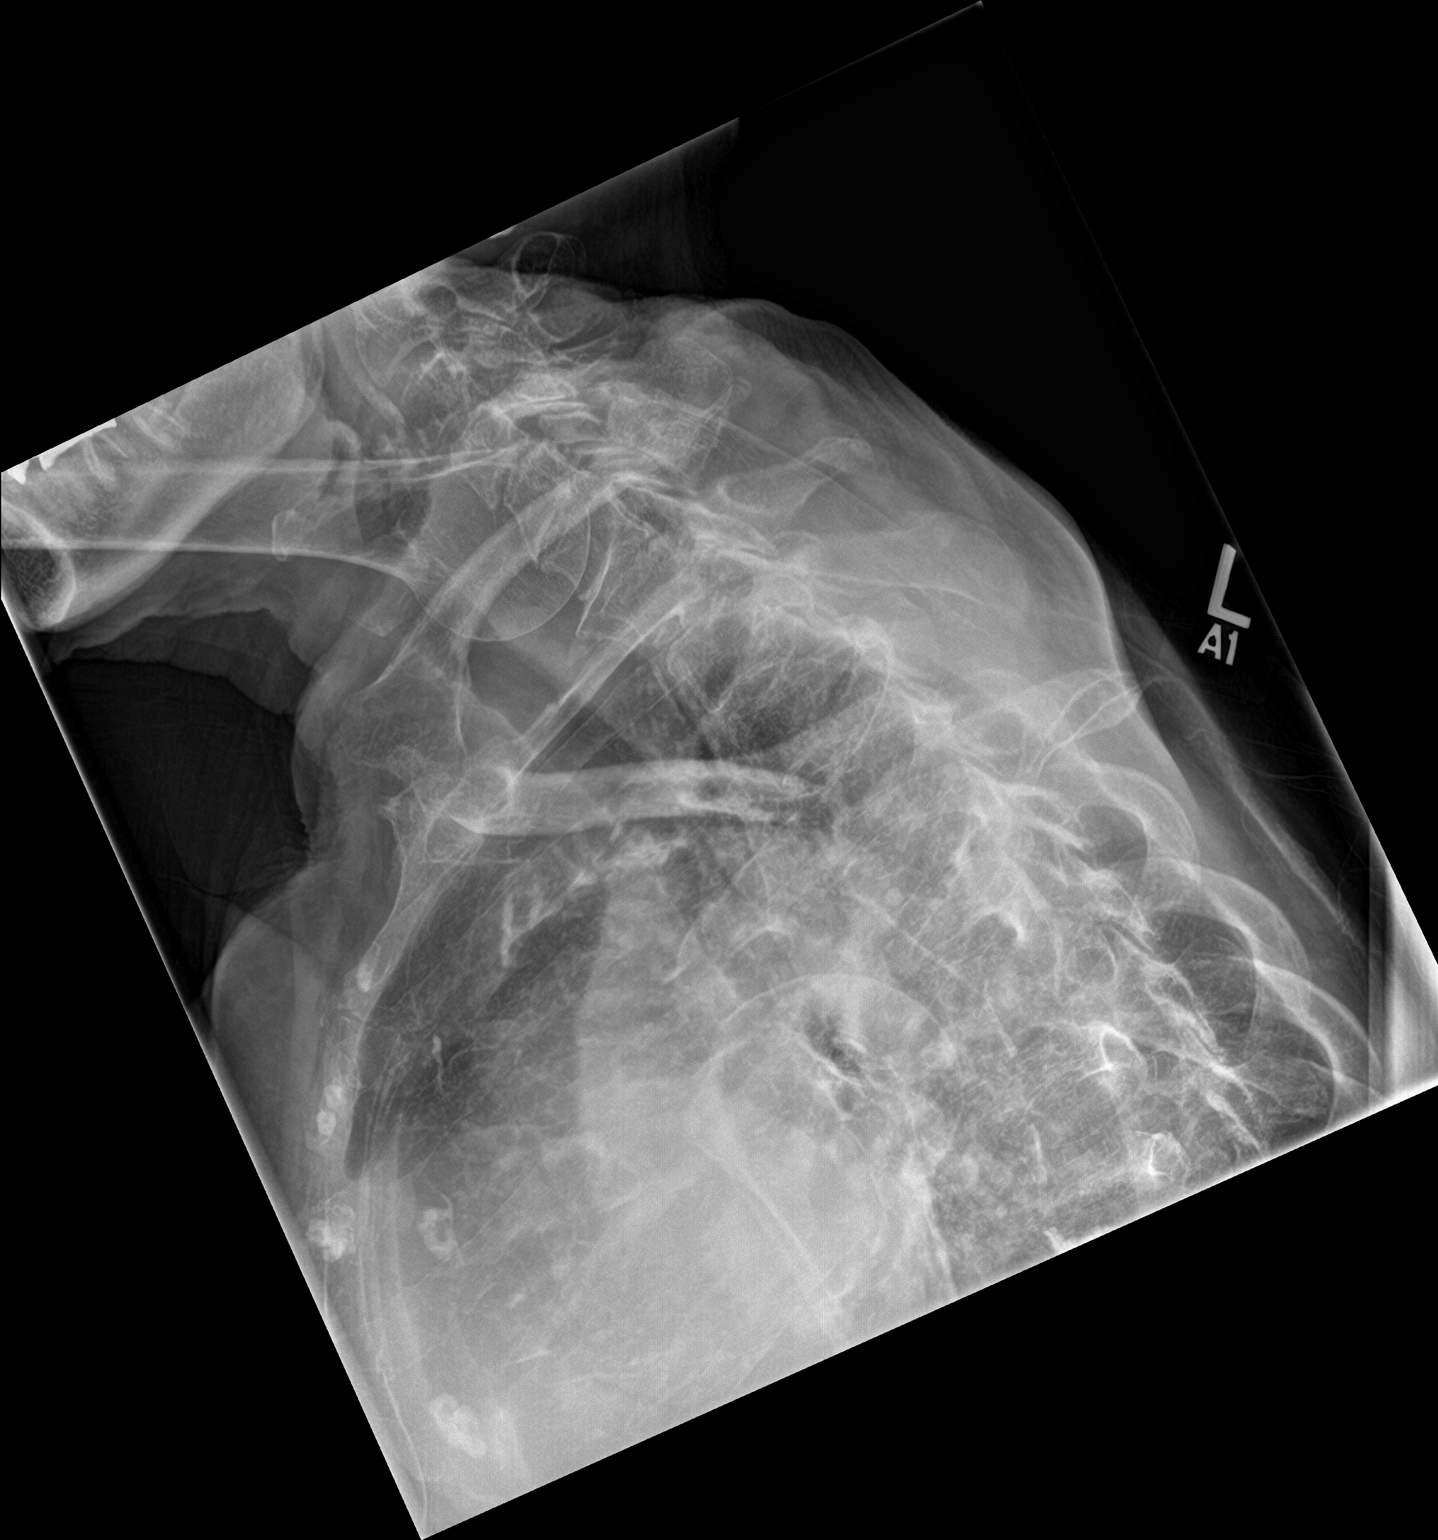

[3 of 3 positions shown; findings below may reference images not displayed]

FINDINGS: Three views of thoracic spine submitted. No acute fracture or
subluxation. Stable mild compression deformity T7 vertebral body.
Again noted prior vertebroplasty and compression deformity T8
vertebral body.
IMPRESSION: No acute fracture or subluxation. Stable mild compression deformity
T7 vertebral body. Again noted prior vertebroplasty and compression
deformity T8 vertebral body.

## 2018-07-22 MED ORDER — POLYETHYLENE GLYCOL 3350 17 G PO PACK
17.0000 g | PACK | Freq: Two times a day (BID) | ORAL | Status: DC
  Filled 2018-07-22: qty 1

## 2018-07-22 MED ORDER — WARFARIN SODIUM 3 MG PO TABS: ORAL_TABLET | ORAL | 1 refills | Status: DC

## 2018-07-22 MED ORDER — SORBITOL 70 % SOLN
30.0000 mL | Freq: Once | Status: DC
  Filled 2018-07-22: qty 30

## 2018-07-22 MED ORDER — HYDROCODONE-ACETAMINOPHEN 5-325 MG PO TABS: 1.0000 | ORAL_TABLET | Freq: Four times a day (QID) | ORAL | 0 refills | Status: DC | PRN

## 2018-07-22 MED ORDER — POLYETHYLENE GLYCOL 3350 17 G PO PACK: 17.0000 g | PACK | Freq: Two times a day (BID) | ORAL | 0 refills | Status: DC

## 2018-07-22 NOTE — Discharge Summary (Signed)
Physician Discharge Summary  Valerie Terrell MRN: [id number] DOB/AGE: Feb 15, 1933 85 y.o.  PCP: Tammi Sou, MD   Admit date: 07/18/2018 Discharge date: 07/22/2018  Discharge Diagnoses:    Principal Problem:   Perineal hematoma Active Problems:   PAF (paroxysmal atrial fibrillation) ([facility])   Essential hypertension   Long term (current) use of anticoagulants [Z79.01]   ESRD on dialysis ([facility])   Fracture of pubic ramus (H[facility])   Fracture of sacrum (H[facility])   Fever   Acute blood loss anemia    Follow-up recommendations Follow-up with PCP in 3-5 days , including all  additional recommended appointments as below Follow-up CBC, CMP in 3-5 days       Allergies as of 07/22/2018      Reactions   Clonidine Derivatives Palpitations, Other (See Comments)   VERY SEDATED   Adhesive [tape] Other (See Comments)   Tears skin up   Codeine Nausea Only   Nickel Rash   Sulfa Antibiotics Other (See Comments)   Reaction unknown      Medication List    TAKE these medications   acetaminophen 500 MG tablet Commonly known as:  TYLENOL Take 500-1,000 mg 3 (three) times daily as needed by mouth for moderate pain.   amiodarone 200 MG tablet Commonly known as:  PACERONE TAKE 1 TABLET BY MOUTH  DAILY   carvedilol 3.125 MG tablet Commonly known as:  COREG TAKE 1 TABLET BY MOUTH TWO  TIMES DAILY What changed:    when to take this  reasons to take this   HYDROcodone-acetaminophen 5-325 MG tablet Commonly known as:  Norco Take 1 tablet by mouth every 6 (six) hours as needed for moderate pain or severe pain. What changed:  how much to take   isosorbide mononitrate 60 MG 24 hr tablet Commonly known as:  IMDUR TAKE 1 TABLET BY MOUTH  DAILY   ondansetron 4 MG tablet Commonly known as:  ZOFRAN Take 1 tablet (4 mg total) by mouth every 8 (eight) hours as needed for nausea or vomiting.   polyethylene glycol packet Commonly known as:  MIRALAX / GLYCOLAX Take 17 g by mouth 2 (two) times  daily.   polyvinyl alcohol 1.4 % ophthalmic solution Commonly known as:  LIQUIFILM TEARS Place 1-2 drops into both eyes as needed for dry eyes.   pravastatin 40 MG tablet Commonly known as:  PRAVACHOL TAKE 1 TABLET BY MOUTH  EVERY EVENING What changed:  when to take this   sevelamer carbonate 800 MG tablet Commonly known as:  RENVELA Take 800 mg by mouth 3 (three) times daily with meals.   vitamin C 500 MG tablet Commonly known as:  ASCORBIC ACID Take 500 mg by mouth daily.   Vitamin D (Ergocalciferol) 1.25 MG (50000 UT) Caps capsule Commonly known as:  DRISDOL Take 1 capsule (50,000 Units total) by mouth every Thursday.   warfarin 3 MG tablet Commonly known as:  COUMADIN Take as directed. If you are unsure how to take this medication, talk to your nurse or doctor. Original instructions:  TAKE 1/2 TO 1 TABLET BY  MOUTH DAILY AS DIRECTED BY  [facility]MADIN CLINIC Start taking on:  August 15, 2018 What changed:  These instructions start on August 15, 2018. If you are unsure what to do until then, ask your doctor or other care provider.        Discharge Condition: stable  Discharge Instructions Get Medicines reviewed and adjusted: Please take all your medications with you for your next visit with  your Primary MD  Please request your Primary MD to go over all hospital tests and procedure/radiological results at the follow up, please ask your Primary MD to get all Hospital records sent to his/her office.  If you experience worsening of your admission symptoms, develop shortness of breath, life threatening emergency, suicidal or homicidal thoughts you must seek medical attention immediately by calling 911 or calling your MD immediately  if symptoms less severe.  You must read complete instructions/literature along with all the possible adverse reactions/side effects for all the Medicines you take and that have been prescribed to you. Take any new Medicines after you have completely  understood and accpet all the possible adverse reactions/side effects.   Do not drive when taking Pain medications.   Do not take more than prescribed Pain, Sleep and Anxiety Medications  Special Instructions: If you have smoked or chewed Tobacco  in the last 2 yrs please stop smoking, stop any regular Alcohol  and or any Recreational drug use.  Wear Seat belts while driving.  Please note  You were cared for by a hospitalist during your hospital stay. Once you are discharged, your primary care physician will handle any further medical issues. Please note that NO REFILLS for any discharge medications will be authorized once you are discharged, as it is imperative that you return to your primary care physician (or establish a relationship with a primary care physician if you do not have one) for your aftercare needs so that they can reassess your need for medications and monitor your lab values.     Allergies  Allergen Reactions  . Clonidine Derivatives Palpitations and Other (See Comments)    VERY SEDATED  . Adhesive [Tape] Other (See Comments)    Tears skin up  . Codeine Nausea Only  . Nickel Rash  . Sulfa Antibiotics Other (See Comments)    Reaction unknown      Disposition:    Consults:   nephrology   Significant Diagnostic Studies:  Ct Pelvis Wo Contrast  Result Date: 07/18/2018 CLINICAL DATA:  Right hip pain. Fall Saturday with bruising and swelling. Pelvic and lower abdominal trauma, lower urinary tract trauma suspected. EXAM: CT PELVIS WITHOUT CONTRAST TECHNIQUE: Multidetector CT imaging of the pelvis was performed following the standard protocol without intravenous contrast. COMPARISON:  Pelvic radiograph earlier this day. Abdominopelvic CT 05/27/2018 FINDINGS: Urinary Tract: Urinary bladder partially distended. No bladder wall thickening. Distal ureters are decompressed. Bowel: Colonic diverticulosis. No acute bowel abnormality of pelvic bowel loops.  Vascular/Lymphatic: Dense vascular calcification of pelvic vasculature. No adenopathy. Reproductive:  No adnexal mass.  Atrophic uterus, normal for age. Other: Subcutaneous heterogeneous high density lesion in the right perineum measuring 6.9 x 5.1 x 5.8 cm with surrounding stranding, most consistent with hematoma. There is no free fluid in the pelvis. Musculoskeletal: Acute nondisplaced right superior pubic ramus fracture, image 51 series 6. Suspected nondisplaced right sacral fracture with presacral edema/hematoma, fracture line not well characterized given osteopenia. Interval callus formation/healing about the known left greater trochanteric fracture from prior CT. Suspect nondisplaced left parasymphyseal pubic body fracture, age indeterminate. Subcutaneous stranding about the right posterior upper thigh and right lateral hip. Mild stranding about the lower anterior abdomen. IMPRESSION: 1. Acute nondisplaced right superior pubic ramus fracture. Suspected nondisplaced right sacral fracture with fracture not well visualized, however there is presacral edema/hematoma. 2. Large 6.9 cm right perineal/inferior gluteal subcutaneous hematoma with associated stranding of the adjacent subcutaneous tissues. Additional stranding/edema of the lower anterior abdominal wall  and lateral right hip. 3. Healing left greater trochanteric fracture. Suspected nondisplaced left parasymphyseal pubic body fracture which is age indeterminate. Electronically Signed   By: Keith Rake M.D.   On: 07/18/2018 23:47   Dg Pelvis Portable  Result Date: 07/18/2018 CLINICAL DATA:  Bruising in the perineum after fall on Saturday. EXAM: PORTABLE PELVIS 1-2 VIEWS COMPARISON:  CT pelvis 05/27/2018 FINDINGS: Redemonstration of nondisplaced left greater trochanteric fracture. Moderate joint space narrowing of hips. No additional proximal femoral fracture is identified. Slight cortical irregularity suggested of the left superior pubic ramus with  faint linear lucencies about the left parasymphysis. Although findings may simply reflect overlapping bowel simulating fractures, CT may help for better assessment. There is mild soft tissue swelling about the hips, right greater than left. IMPRESSION: 1. Nondisplaced known left greater trochanteric fracture. 2. Slight cortical irregularity of the left superior pubic ramus with faint linear lucencies about the left parasymphysis. Although findings may reflect overlapping bowel simulating fractures, CT may help for better assessment based on clinical concern. Electronically Signed   By: Ashley Royalty M.D.   On: 07/18/2018 22:54   Dg Chest Port 1 View  Result Date: 07/18/2018 CLINICAL DATA:  Pain after fall on Saturday EXAM: PORTABLE CHEST 1 VIEW COMPARISON:  CT thoracic spine 02/14/2018 FINDINGS: Stable cardiomegaly with aortic atherosclerosis. No overt pulmonary edema, effusion or contusion. Vascular calcifications project over the apices bilaterally. Left-sided pacemaker apparatus projects over the upper left axilla with leads in the right atrium and right ventricle. No acute pulmonary consolidation. Single level kyphoplasty at T8. Remote right-sided fifth and sixth rib fractures. No acute displaced rib fracture. Osteoarthritis of the acromioclavicular and glenohumeral joints. IMPRESSION: 1. Stable cardiomegaly with aortic atherosclerosis. No acute pulmonary disease. 2. No acute osseous abnormality. Electronically Signed   By: Ashley Royalty M.D.   On: 07/18/2018 22:58       Filed Weights   07/21/18 0556 07/21/18 1226 07/22/18 0500  Weight: 56.4 kg 57 kg 55.8 kg     Microbiology: Recent Results (from the past 240 hour(s))  Blood Culture (routine x 2)     Status: None (Preliminary result)   Collection Time: 07/18/18 10:36 PM  Result Value Ref Range Status   Specimen Description BLOOD LEFT HAND  Final   Special Requests   Final    BOTTLES DRAWN AEROBIC AND ANAEROBIC Blood Culture adequate volume    Culture   Final    NO GROWTH 2 DAYS Performed at [facility], [address] [place], [place] [address]    Report Status PENDING  Incomplete  Blood Culture (routine x 2)     Status: None (Preliminary result)   Collection Time: 07/18/18 10:50 PM  Result Value Ref Range Status   Specimen Description BLOOD LEFT ANTECUBITAL  Final   Special Requests   Final    BOTTLES DRAWN AEROBIC AND ANAEROBIC Blood Culture adequate volume   Culture   Final    NO GROWTH 3 DAYS Performed at [facility], [place] [address], [place], [place] [address]    Report Status PENDING  Incomplete       Blood Culture    Component Value Date/Time   SDES BLOOD LEFT ANTECUBITAL 07/18/2018 2250   SPECREQUEST  07/18/2018 2250    BOTTLES DRAWN AEROBIC AND ANAEROBIC Blood Culture adequate volume   CULT  07/18/2018 2250    NO GROWTH 3 DAYS Performed at [facility], [place] [address] [place], [place] [address]  REPTSTATUS PENDING 07/18/2018 2250      Labs: Results for orders placed or performed during the hospital encounter of 07/18/18 (from the past 48 hour(s))  Protime-INR     Status: None   Collection Time: 07/21/18  5:46 AM  Result Value Ref Range   Prothrombin Time 13.1 11.4 - 15.2 seconds   INR 1.0 0.8 - 1.2    Comment: (NOTE) INR goal varies based on device and disease states. Performed at [facility], [place] [address], [place], [place] [address]   Renal function panel     Status: Abnormal   Collection Time: 07/21/18  1:01 PM  Result Value Ref Range   Sodium 134 (L) 135 - 145 mmol/L   Potassium 3.6 3.5 - 5.1 mmol/L   Chloride 97 (L) 98 - 111 mmol/L   CO2 25 22 - 32 mmol/L   Glucose, Bld 112 (H) 70 - 99 mg/dL   BUN 32 (H) 8 - 23 mg/dL   Creatinine, Ser 4.58 (H) 0.44 - 1.00 mg/dL   Calcium 8.2 (L) 8.9 - 10.3 mg/dL   Phosphorus 4.7 (H) 2.5 - 4.6 mg/dL   Albumin 2.3 (L) 3.5 - 5.0 g/dL   GFR calc non Af Amer 8 (L) >60 mL/min   GFR calc Af Amer 10 (L) >60 mL/min   Anion  gap 12 5 - 15    Comment: Performed at [facility] [address], [place], [place] [address]  Protime-INR     Status: None   Collection Time: 07/22/18  2:51 AM  Result Value Ref Range   Prothrombin Time 13.4 11.4 - 15.2 seconds   INR 1.0 0.8 - 1.2    Comment: (NOTE) INR goal varies based on device and disease states. Performed at [facility], [place] [address] [place], [place] [address]   CBC     Status: Abnormal   Collection Time: 07/22/18  2:51 AM  Result Value Ref Range   WBC 8.0 4.0 - 10.5 K/uL   RBC 3.90 3.87 - 5.11 MIL/uL   Hemoglobin 11.8 (L) 12.0 - 15.0 g/dL   HCT 37.2 36.0 - 46.0 %   MCV 95.4 80.0 - 100.0 fL   MCH 30.3 26.0 - 34.0 pg   MCHC 31.7 30.0 - 36.0 g/dL   RDW 16.5 (H) 11.5 - 15.5 %   Platelets 164 150 - 400 K/uL   nRBC 0.0 0.0 - 0.2 %    Comment: Performed at [facility], [place] [address], [place], [place] [address]  Basic metabolic panel     Status: Abnormal   Collection Time: 07/22/18  2:51 AM  Result Value Ref Range   Sodium 137 135 - 145 mmol/L   Potassium 3.9 3.5 - 5.1 mmol/L   Chloride 100 98 - 111 mmol/L   CO2 26 22 - 32 mmol/L   Glucose, Bld 96 70 - 99 mg/dL   BUN 14 8 - 23 mg/dL   Creatinine, Ser 2.41 (H) 0.44 - 1.00 mg/dL    Comment: DELTA CHECK NOTED   Calcium 8.2 (L) 8.9 - 10.3 mg/dL   GFR calc non Af Amer 18 (L) >60 mL/min   GFR calc Af Amer 21 (L) >60 mL/min   Anion gap 11 5 - 15    Comment: Performed at [facility] [address], [place], [place] [address]     Lipid Panel     Component Value Date/Time   CHOL 194 03/28/2018 1035  CHOL 140 06/17/2016 0819   TRIG 210.0 (H) 03/28/2018 1035   HDL 62.10 03/28/2018 1035   HDL 39 (L) 06/17/2016 0819   CHOLHDL 3 03/28/2018 1035   VLDL 42.0 (H) 03/28/2018 1035   LDLCALC 72 06/17/2016 0819   LDLDIRECT 98.0 03/28/2018 1035     Lab Results  Component Value Date   HGBA1C 5.2 12/15/2016   HGBA1C 5.6 01/29/2016     Lab Results  Component Value Date    LDLCALC 72 06/17/2016   CREATININE 2.41 (H) 07/22/2018     HPI :  85 y.o.femalewith medical history significant ofESRD on dialysis TTS. Patient presents for pelvic pain following a fall on Sat. -Patient is on chronic Coumadin for history of A. Fib -Upon work-up in the ER she was noted to have pelvic fractures large perineal hematoma and acute blood loss anemia  HOSPITAL COURSE:   Pelvic fracture -Including right superior pubic ramus and sacral ala -Following mechanical fall -Appreciate orthopedics input -Recommended weightbearing as tolerated, physical therapy eval -Continue physical therapy, pain controlled ideally Tylenol as needed -Social work consult for short-term rehab    Large perineal hematoma -CT on admission noted Large 6.9 cm right perineal/inferior gluteal subcutaneous hematoma  -In the setting of pelvic fracture fall and chronic anticoagulation with Coumadin -Clinically do not think her hematoma is infected, she was febrile to 101 on admission however afebrile nontoxic without pain or discomfort at the local site  -Antibiotics discontinued  -Coumadin held temporarily given vitamin K on admission to reverse coagulopathy  -Supportive care, weightbearing as tolerated, physical therapy, rehab    Acute blood loss anemia and anemia of chronic disease -Secondary to large hematoma, status post 2 units of PRBC , hemoglobin has improved from 7.9-11.8 -Monitor hemoglobin, hold Coumadin -Stable  Paroxysmal atrial fibrillation -Coumadin held as discussed above, will need to hold this for at least 2 to 3 weeks -Discussed with patient and daughter regarding slight increased stroke risk while off anticoagulation -Continue amiodarone  ESRD on hemodialysis Tuesday Thursday Saturday -Renal following Continue MWF schedule   Discharge Exam:   Blood pressure (!) 125/49, pulse 82, temperature 98.3 F (36.8 C), temperature source Oral, resp. rate 14, height 4\' 11"  (1.499  m), weight 55.8 kg, SpO2 100 %. Gen: Awake, Alert, Oriented X 3, chronically ill-appearing, no distress HEENT: PERRLA, Neck supple, no JVD Lungs: Clear bilaterally CVS: RRR,No Gallops,Rubs or new Murmurs Abd: Soft, nontender, nondistended, bowel sounds present, bruising in the right groin, right flank and gluteal area  Extremities: No Cyanosis, Clubbing or edema Skin: Bruising as noted above     Follow-up Information    McGowen, Adrian Blackwater, MD. Call.   Specialty:  Family Medicine Why:  Hospital follow-up in 3-5 days, check CBC, CMP weekly, resume Coumadin once patient's hematoma is stable in 2-3 weeks Contact information: [address] [place] [address] [phone]        Lorretta Harp, MD .   Specialties:  Cardiology, Radiology Contact information: [address] [place] [address] [phone]           Signed: Reyne Dumas 07/22/2018, 9:39 AM      Time needed to  prepare  discharge, discussed with the patient and family 83 minutes

## 2018-07-22 NOTE — Progress Notes (Addendum)
[facility] Progress Note   Subjective:   Patient seen and examined at bedside.  Pain well controlled.  Dialysis went well yesterday.  No new complaints.  Hoping to d/c home today.   Objective Vitals:   07/21/18 2030 07/22/18 0428 07/22/18 0500 07/22/18 0934  BP: (!) 125/53 (!) 160/57  (!) 125/49  Pulse: 80 75  82  Resp: 12 14    Temp: 98.6 F (37 C) 98.3 F (36.8 C)    TempSrc: Oral Oral    SpO2: 93% 100%  100%  Weight:   55.8 kg   Height:       Physical Exam General:NAD, pleasant, elderly female sitting in bed Heart:RRR, no mrg Lungs:CTAB, no wheeze, rales or rhonchi Abdomen:soft, NTND, +BS Extremities:Large hematoma of R pelvis extending up back, in various stages of healing, +tenderness. Trace LE edema on R, non on L.  Dialysis Access: RU AVF +b   Filed Weights   07/21/18 0556 07/21/18 1226 07/22/18 0500  Weight: 56.4 kg 57 kg 55.8 kg    Intake/Output Summary (Last 24 hours) at 07/22/2018 1219 Last data filed at 07/21/2018 1641 Gross per 24 hour  Intake -  Output 2501 ml  Net -2501 ml    Additional Objective Labs: Basic Metabolic Panel: Recent Labs  Lab 07/19/18 0326 07/21/18 1301 07/22/18 0251  NA 137 134* 137  K 4.8 3.6 3.9  CL 94* 97* 100  CO2 27 25 26   GLUCOSE 106* 112* 96  BUN 34* 32* 14  CREATININE 5.79* 4.58* 2.41*  CALCIUM 8.3* 8.2* 8.2*  PHOS  --  4.7*  --    Liver Function Tests: Recent Labs  Lab 07/18/18 2236 07/21/18 1301  AST 17  --   ALT 9  --   ALKPHOS 59  --   BILITOT 0.5  --   PROT 5.8*  --   ALBUMIN 2.6* 2.3*   CBC: Recent Labs  Lab 07/18/18 2236 07/19/18 0326 07/19/18 1921 07/20/18 0424 07/22/18 0251  WBC 11.0* 10.9*  --  11.7* 8.0  NEUTROABS 8.3*  --   --   --   --   HGB 8.0* 7.9* 11.7* 11.1* 11.8*  HCT 25.5* 24.7* 36.0 33.4* 37.2  MCV 101.2* 100.4*  --  92.5 95.4  PLT 173 167  --  144* 164   Blood Culture    Component Value Date/Time   SDES BLOOD LEFT ANTECUBITAL 07/18/2018 2250   SPECREQUEST  07/18/2018 2250    BOTTLES DRAWN AEROBIC AND ANAEROBIC Blood Culture adequate volume   CULT  07/18/2018 2250    NO GROWTH 3 DAYS Performed at [facility], [address], [place], [place] [address]    REPTSTATUS PENDING 07/18/2018 2250    Lab Results  Component Value Date   INR 1.0 07/22/2018   INR 1.0 07/21/2018   INR 1.1 07/20/2018   PROTIME 22.9 (A) 06/30/2018   PROTIME 24.3 (A) 06/23/2018   Medications:  . amiodarone  200 mg Oral Daily  . Chlorhexidine Gluconate Cloth  6 each Topical Q0600  . isosorbide mononitrate  60 mg Oral Daily  . polyethylene glycol  17 g Oral BID  . pravastatin  40 mg Oral Daily  . sevelamer carbonate  800 mg Oral TID WC  . sorbitol  30 mL Oral Once    Dialysis Orders: [facility] TTS 4h 52kg 350/ 600 2/2 bath RUA AVG Hep 1400 - venofer 50 /wk - hect 4 ug  Assessment/Plan: 1. Pelvic/sacral fracture, perineal hematoma:  pain well controlled.  Hgb now stable, s/p pRBC.   2. Fevers - initially presented w/fever, ?infected hematoma? S/p course vanc, flagyl and cefepime.  Afebrile since 3/2. 3. ESRD - on HD MWF.  K 3.9. Next HD on 3/7, will write orders if remains inpatient, otherwise can go to OP center.  4. Anemia of CKD- Hgb 11.8. S/p 2 units pRBC on 3/3 5. Secondary hyperparathyroidism - Ca and phos at goal. Continue VDRA and binders. 6. HTN/volume - BP well controlled.  Not to EDW.  Chronic difficultly with volume removal. Trace edema on exam. Reassess EDW as OP.   7. Nutrition - Alb 2.6. Renal diet w/fluid restrictions. Protein supplements 8. PAF - per primary.  Coumadin resumed. 9. Dispo - ok to d/c from renal standpoint.  Plan to d/c home w/daughter and [place].  Jen Mow, PA-C [facility] Pager: [phone] 07/22/2018,12:19 PM  LOS: 3 days   Pt seen, examined and agree w A/P as above.  [facility] 07/22/2018, 1:26 PM

## 2018-07-22 NOTE — Plan of Care (Signed)
  Problem: Education: ?Goal: Knowledge of General Education information will improve ?Description: Including pain rating scale, medication(s)/side effects and non-pharmacologic comfort measures ?Outcome: Progressing ?  ?Problem: Clinical Measurements: ?Goal: Respiratory complications will improve ?Outcome: Progressing ?  ?Problem: Activity: ?Goal: Risk for activity intolerance will decrease ?Outcome: Progressing ?  ?Problem: Nutrition: ?Goal: Adequate nutrition will be maintained ?Outcome: Progressing ?  ?Problem: Coping: ?Goal: Level of anxiety will decrease ?Outcome: Progressing ?  ?Problem: Elimination: ?Goal: Will not experience complications related to bowel motility ?Outcome: Progressing ?Goal: Will not experience complications related to urinary retention ?Outcome: Progressing ?  ?

## 2018-07-22 NOTE — Plan of Care (Signed)
  Problem: Clinical Measurements: Goal: Ability to maintain clinical measurements within normal limits will improve Outcome: Progressing   Problem: Activity: Goal: Risk for activity intolerance will decrease Outcome: Progressing   Problem: Nutrition: Goal: Adequate nutrition will be maintained Outcome: Progressing   Problem: Elimination: Goal: Will not experience complications related to bowel motility Outcome: Progressing   Problem: Pain Managment: Goal: General experience of comfort will improve Outcome: Progressing   Problem: Safety: Goal: Ability to remain free from injury will improve Outcome: Progressing   

## 2018-07-22 NOTE — Progress Notes (Signed)
Physical Therapy Treatment Patient Details Name: Valerie Terrell MRN: [id number] DOB: 04-08-1933 Today's Date: 07/22/2018    History of Present Illness (P) The patient is a 85 y.o. year-old w/ hx of CVA, SSS/ PPM, atrial fib, chron resp failrue on home O2, anemia , HTN and ESRD on HD TTS at [facility].  Pt presented to ED 3/2. She fell down on Sat 2/29, went to HD that day and 'was able to make it through". Got weaker daily so was brought to ED.  Severe pain in sacral area. CT showed fx's of R sup pubic ramus and likely R sacral fracture as well. Also large 6.9cm hematoma perineal area.    PT Comments    Patient is making progress toward PT goals and reports no pain today with ambulation. Continue to progress as tolerated.    Follow Up Recommendations  SNF     Equipment Recommendations  Other (comment)(youth sized RW)    Recommendations for Other Services       Precautions / Restrictions Precautions Precautions: (P) Fall Restrictions Weight Bearing Restrictions: Yes RLE Weight Bearing: Weight bearing as tolerated LLE Weight Bearing: Weight bearing as tolerated    Mobility  Bed Mobility Overal bed mobility: Modified Independent Bed Mobility: Supine to Sit           General bed mobility comments: Increased time and use of rail to get to EOB.  Transfers Overall transfer level: Needs assistance Equipment used: Rolling walker (2 wheeled)(youth) Transfers: Sit to/from Stand Sit to Stand: Min guard         General transfer comment: Min guard for safety; cues for safe hand placement; pt likes to pull on RW to stand   Ambulation/Gait Ambulation/Gait assistance: Min guard Gait Distance (Feet): (60 ft X 2 trials with seated rest break) Assistive device: Rolling walker (2 wheeled)(youth) Gait Pattern/deviations: Step-through pattern;Decreased step length - right;Decreased step length - left;Drifts right/left;Trunk flexed Gait velocity: decreased   General Gait Details:  increased flexed posture with fatigue; cues for maintaining safe proximity to RW and to for upright posture   Stairs             Wheelchair Mobility    Modified Rankin (Stroke Patients Only)       Balance Overall balance assessment: Needs assistance Sitting-balance support: Feet supported;No upper extremity supported Sitting balance-Leahy Scale: Fair     Standing balance support: During functional activity Standing balance-Leahy Scale: Fair                              Cognition Arousal/Alertness: (P) Awake/alert Behavior During Therapy: (P) WFL for tasks assessed/performed Overall Cognitive Status: (P) Within Functional Limits for tasks assessed                                        Exercises      General Comments General comments (skin integrity, edema, etc.): RW adjusted      Pertinent Vitals/Pain Pain Assessment: (P) No/denies pain    Home Living                      Prior Function            PT Goals (current goals can now be found in the care plan section) Progress towards PT goals: Progressing toward goals    Frequency  Min 5X/week      PT Plan Current plan remains appropriate    Co-evaluation              AM-PAC PT "6 Clicks" Mobility   Outcome Measure  Help needed turning from your back to your side while in a flat bed without using bedrails?: None Help needed moving from lying on your back to sitting on the side of a flat bed without using bedrails?: A Little Help needed moving to and from a bed to a chair (including a wheelchair)?: None Help needed standing up from a chair using your arms (e.g., wheelchair or bedside chair)?: A Little Help needed to walk in hospital room?: A Little Help needed climbing 3-5 steps with a railing? : A Little 6 Click Score: 20    End of Session Equipment Utilized During Treatment: Gait belt Activity Tolerance: Patient tolerated treatment well Patient  left: in chair;with call bell/phone within reach;with chair alarm set;with family/visitor present Nurse Communication: Mobility status PT Visit Diagnosis: Unsteadiness on feet (R26.81);Other abnormalities of gait and mobility (R26.89)     Time: 0093-8182 PT Time Calculation (min) (ACUTE ONLY): 18 min  Charges:  $Gait Training: 8-22 mins                     Earney Navy, PTA Acute Rehabilitation Services Pager: [phone] Office: [phone]     Darliss Cheney 07/22/2018, 1:10 PM

## 2018-07-22 NOTE — Progress Notes (Signed)
Occupational Therapy Treatment Patient Details Name: Valerie Terrell MRN: [id number] DOB: March 06, 1933 Today's Date: 07/22/2018    History of present illness The patient is a 85 y.o. year-old w/ hx of CVA, SSS/ PPM, atrial fib, chron resp failrue on home O2, anemia , HTN and ESRD on HD TTS at [facility].  Pt presented to ED 3/2. She fell down on Sat 2/29, went to HD that day and 'was able to make it through". Got weaker daily so was brought to ED.  Severe pain in sacral area. CT showed fx's of R sup pubic ramus and likely R sacral fracture as well. Also large 6.9cm hematoma perineal area.   OT comments  Pt demonstrates bed mobility at MOD I level and will have daughter (A) for d/c home today. Pt is agreeable to home therapy and aide .    Follow Up Recommendations  Home health OT    Equipment Recommendations  3 in 1 bedside commode;Other (comment)    Recommendations for Other Services      Precautions / Restrictions Precautions Precautions: Fall Restrictions Weight Bearing Restrictions: Yes RLE Weight Bearing: Weight bearing as tolerated LLE Weight Bearing: Weight bearing as tolerated       Mobility Bed Mobility Overal bed mobility: Modified Independent Bed Mobility: Supine to Sit           General bed mobility comments: hob flat no bed rails  R and L side  Transfers Overall transfer level: Needs assistance Equipment used: Rolling walker (2 wheeled) Transfers: Sit to/from Stand Sit to Stand: Supervision         General transfer comment: Min guard for safety; cues for safe hand placement; pt likes to pull on RW to stand     Balance Overall balance assessment: Needs assistance Sitting-balance support: Feet supported;No upper extremity supported Sitting balance-Leahy Scale: Fair     Standing balance support: During functional activity Standing balance-Leahy Scale: Fair                             ADL either performed or assessed with clinical judgement    ADL Overall ADL's : Needs assistance/impaired                                   Tub/Shower Transfer Details (indicate cue type and reason): discussed bathing and will sponge bath at this stime Functional mobility during ADLs: Supervision/safety;Rolling walker General ADL Comments: pt with daughter present. daughter attempting to encourage patient throughtouit sessiojn     Vision       Perception     Praxis      Cognition Arousal/Alertness: Awake/alert Behavior During Therapy: WFL for tasks assessed/performed Overall Cognitive Status: Within Functional Limits for tasks assessed                                          Exercises     Shoulder Instructions       General Comments RW adjusted    Pertinent Vitals/ Pain       Pain Assessment: No/denies pain  Home Living  Prior Functioning/Environment              Frequency  Min 2X/week        Progress Toward Goals  OT Goals(current goals can now be found in the care plan section)  Progress towards OT goals: Progressing toward goals  Acute Rehab OT Goals Patient Stated Goal: go home today OT Goal Formulation: With patient Time For Goal Achievement: 08/03/18 Potential to Achieve Goals: Good ADL Goals Pt Will Transfer to Toilet: with modified independence Pt Will Perform Tub/Shower Transfer: Shower transfer;with modified independence(will sponge bath instead - d/c goal) Additional ADL Goal #1: pt will be mod i for bed mobility at this time as precursor to adls.(met)  Plan Discharge plan remains appropriate    Co-evaluation                 AM-PAC OT "6 Clicks" Daily Activity     Outcome Measure   Help from another person eating meals?: None Help from another person taking care of personal grooming?: A Little Help from another person toileting, which includes using toliet, bedpan, or urinal?: A Little Help  from another person bathing (including washing, rinsing, drying)?: A Lot Help from another person to put on and taking off regular upper body clothing?: A Little Help from another person to put on and taking off regular lower body clothing?: A Lot 6 Click Score: 17    End of Session Equipment Utilized During Treatment: Rolling walker  OT Visit Diagnosis: Unsteadiness on feet (R26.81)   Activity Tolerance Patient tolerated treatment well   Patient Left in chair;with call bell/phone within reach;with chair alarm set;with family/visitor present   Nurse Communication Mobility status;Precautions        Time: 1610-9604 OT Time Calculation (min): 9 min  Charges: OT General Charges $OT Visit: 1 Visit OT Treatments $Self Care/Home Management : 8-22 mins   Jeri Modena, OTR/L  Acute Rehabilitation Services Pager: [phone] Office: [phone] .    Jeri Modena 07/22/2018, 1:12 PM

## 2018-07-22 NOTE — Progress Notes (Signed)
D/c instructions explained to patient and daughter no further questions. Patient Ax0 x4. No c/o pain. No sighns of respiratory distress. PIV removed.

## 2018-07-22 NOTE — Care Management Note (Signed)
CSW met with patient and daughter concerning discharge planning and DME needs. Choice was offered for home health agency, patient daughter reports they have used [name] in the past and want to do so. Referral called to [facility] liaison. Youth walker ordered for patient. Patient will discharge home with family support.   Signing off.   [place], Kevin

## 2018-07-23 DIAGNOSIS — I120 Hypertensive chronic kidney disease with stage 5 chronic kidney disease or end stage renal disease: Secondary | ICD-10-CM | POA: Diagnosis not present

## 2018-07-23 DIAGNOSIS — S32511D Fracture of superior rim of right pubis, subsequent encounter for fracture with routine healing: Secondary | ICD-10-CM | POA: Diagnosis not present

## 2018-07-23 DIAGNOSIS — D638 Anemia in other chronic diseases classified elsewhere: Secondary | ICD-10-CM | POA: Diagnosis not present

## 2018-07-23 DIAGNOSIS — Z79891 Long term (current) use of opiate analgesic: Secondary | ICD-10-CM | POA: Diagnosis not present

## 2018-07-23 DIAGNOSIS — N2581 Secondary hyperparathyroidism of renal origin: Secondary | ICD-10-CM | POA: Diagnosis not present

## 2018-07-23 DIAGNOSIS — E876 Hypokalemia: Secondary | ICD-10-CM | POA: Diagnosis not present

## 2018-07-23 DIAGNOSIS — I480 Paroxysmal atrial fibrillation: Secondary | ICD-10-CM | POA: Diagnosis not present

## 2018-07-23 DIAGNOSIS — N186 End stage renal disease: Secondary | ICD-10-CM | POA: Diagnosis not present

## 2018-07-23 DIAGNOSIS — D689 Coagulation defect, unspecified: Secondary | ICD-10-CM | POA: Diagnosis not present

## 2018-07-23 DIAGNOSIS — D509 Iron deficiency anemia, unspecified: Secondary | ICD-10-CM | POA: Diagnosis not present

## 2018-07-23 DIAGNOSIS — Z992 Dependence on renal dialysis: Secondary | ICD-10-CM | POA: Diagnosis not present

## 2018-07-23 DIAGNOSIS — Z7901 Long term (current) use of anticoagulants: Secondary | ICD-10-CM | POA: Diagnosis not present

## 2018-07-23 DIAGNOSIS — E8770 Fluid overload, unspecified: Secondary | ICD-10-CM | POA: Diagnosis not present

## 2018-07-23 DIAGNOSIS — D62 Acute posthemorrhagic anemia: Secondary | ICD-10-CM | POA: Diagnosis not present

## 2018-07-23 DIAGNOSIS — W1830XD Fall on same level, unspecified, subsequent encounter: Secondary | ICD-10-CM | POA: Diagnosis not present

## 2018-07-23 LAB — CULTURE, BLOOD (ROUTINE X 2)
Culture: NO GROWTH
SPECIAL REQUESTS: ADEQUATE

## 2018-07-23 LAB — BASIC METABOLIC PANEL WITH GFR
Anion gap: 11 (ref 5–15)
BUN: 14 mg/dL (ref 8–23)
CO2: 26 mmol/L (ref 22–32)
Calcium: 8.2 mg/dL — ABNORMAL LOW (ref 8.9–10.3)
Chloride: 100 mmol/L (ref 98–111)
Creatinine, Ser: 2.41 mg/dL — ABNORMAL HIGH (ref 0.44–1.00)
GFR calc Af Amer: 21 mL/min — ABNORMAL LOW
GFR calc non Af Amer: 18 mL/min — ABNORMAL LOW
Glucose, Bld: 96 mg/dL (ref 70–99)
Potassium: 3.9 mmol/L (ref 3.5–5.1)
Sodium: 137 mmol/L (ref 135–145)

## 2018-07-24 LAB — CULTURE, BLOOD (ROUTINE X 2)
Culture: NO GROWTH
Special Requests: ADEQUATE

## 2018-07-25 ENCOUNTER — Telehealth: Payer: Self-pay | Admitting: Cardiovascular Disease

## 2018-07-25 ENCOUNTER — Telehealth: Payer: Self-pay | Admitting: *Deleted

## 2018-07-25 ENCOUNTER — Telehealth: Payer: Self-pay

## 2018-07-25 DIAGNOSIS — S32511D Fracture of superior rim of right pubis, subsequent encounter for fracture with routine healing: Secondary | ICD-10-CM | POA: Diagnosis not present

## 2018-07-25 DIAGNOSIS — Z79891 Long term (current) use of opiate analgesic: Secondary | ICD-10-CM | POA: Diagnosis not present

## 2018-07-25 DIAGNOSIS — D62 Acute posthemorrhagic anemia: Secondary | ICD-10-CM | POA: Diagnosis not present

## 2018-07-25 DIAGNOSIS — I120 Hypertensive chronic kidney disease with stage 5 chronic kidney disease or end stage renal disease: Secondary | ICD-10-CM | POA: Diagnosis not present

## 2018-07-25 DIAGNOSIS — I480 Paroxysmal atrial fibrillation: Secondary | ICD-10-CM | POA: Diagnosis not present

## 2018-07-25 DIAGNOSIS — D638 Anemia in other chronic diseases classified elsewhere: Secondary | ICD-10-CM | POA: Diagnosis not present

## 2018-07-25 DIAGNOSIS — W1830XD Fall on same level, unspecified, subsequent encounter: Secondary | ICD-10-CM | POA: Diagnosis not present

## 2018-07-25 DIAGNOSIS — Z7901 Long term (current) use of anticoagulants: Secondary | ICD-10-CM | POA: Diagnosis not present

## 2018-07-25 DIAGNOSIS — N186 End stage renal disease: Secondary | ICD-10-CM | POA: Diagnosis not present

## 2018-07-25 DIAGNOSIS — Z992 Dependence on renal dialysis: Secondary | ICD-10-CM | POA: Diagnosis not present

## 2018-07-25 NOTE — Telephone Encounter (Signed)
New Message        Patient's daughter is calling in today to get a 2 week post hospital f/u, there is nothing available until 09/09/2018. Is that to long for patient to come in for a f/u?

## 2018-07-25 NOTE — Telephone Encounter (Signed)
Transition Care Management Follow-up Telephone Call  Admit date: 07/18/2018 Discharge date: 07/22/2018 Principal Problem:  Perineal hematoma   How have you been since you were released from the hospital? " I feel alright"   Do you understand why you were in the hospital? yes   Do you understand the discharge instructions? yes   Where were you discharged to? Home. Daughter with patient.    Items Reviewed:  Medications reviewed: no  Allergies reviewed: no  Dietary changes reviewed: yes  Referrals reviewed: yes   Functional Questionnaire:   Activities of Daily Living (ADLs):   She states they are independent in the following: ambulation, bathing and hygiene, feeding, continence, grooming, toileting and dressing States they require assistance with the following: Pt states she is independent. PT to visit this week.    Any transportation issues/concerns?: no   Any patient concerns? no   Confirmed importance and date/time of follow-up visits scheduled yes  Provider Appointment booked with PCP 07/27/2018.   Confirmed with patient if condition begins to worsen call PCP or go to the ER.  Patient was given the office number and encouraged to call back with question or concerns.  : yes

## 2018-07-25 NOTE — Telephone Encounter (Signed)
She does not need to come for the 3/18 INR check.  Hospital d/c recommends 2-3 weeks off anticoagulation.  Would suggest they discuss with Jory Sims at 3/25 appointment for when to re-start and schedule INR check for 7-10 days after re-start.

## 2018-07-25 NOTE — Telephone Encounter (Signed)
LM requesting call back to complete TCM and schedule hospital follow up.   

## 2018-07-25 NOTE — Telephone Encounter (Signed)
Spoke to patient Valerie Terrell's advice given.Post hospital appointment moved up to [facility] 08/03/18 at 2:30 pm.

## 2018-07-25 NOTE — Telephone Encounter (Signed)
Returned call to patient's daughter she stated mother fell and fractured pelvis.She has been off coumadin for 1 week.She has INR appointment 3/18.She wanted to know if she needs to keep that appointment.Advised to keep post hospital appointment as planned with Jory Sims DNP 08/10/18 at 2:30 pm.Advised I will send message to coumadin clinic for advice.

## 2018-07-25 NOTE — Telephone Encounter (Signed)
[facility] nurse called requesting [facility] orders for pt. Pt was recently d/c from hospital for fall.   SW Dr. Anitra Lauth, was given verbal okay for requested orders.   [facility] nurse advised and voiced understanding.

## 2018-07-26 ENCOUNTER — Telehealth: Payer: Self-pay

## 2018-07-26 ENCOUNTER — Telehealth: Payer: Self-pay | Admitting: *Deleted

## 2018-07-26 DIAGNOSIS — E8770 Fluid overload, unspecified: Secondary | ICD-10-CM | POA: Diagnosis not present

## 2018-07-26 DIAGNOSIS — D509 Iron deficiency anemia, unspecified: Secondary | ICD-10-CM | POA: Diagnosis not present

## 2018-07-26 DIAGNOSIS — N186 End stage renal disease: Secondary | ICD-10-CM | POA: Diagnosis not present

## 2018-07-26 DIAGNOSIS — E876 Hypokalemia: Secondary | ICD-10-CM | POA: Diagnosis not present

## 2018-07-26 DIAGNOSIS — D689 Coagulation defect, unspecified: Secondary | ICD-10-CM | POA: Diagnosis not present

## 2018-07-26 DIAGNOSIS — N2581 Secondary hyperparathyroidism of renal origin: Secondary | ICD-10-CM | POA: Diagnosis not present

## 2018-07-26 NOTE — Telephone Encounter (Signed)
Copied from [place] [id number]. Topic: General - Other >> Jul 26, 2018  9:59 AM Oneta Rack wrote: Caller name: Benjamine Mola  Relation to pt: PT from [facility]  Call back number: [phone]    Reason for call:  PT requesting  verbal orders 1x 4 to address safety with walker after the fall

## 2018-07-26 NOTE — Telephone Encounter (Signed)
Home health nurse called to report that the pt had taken a fall and has massive hematomas and the hospitalist had dc'd coumadin but the pt is concerned pharmd please address

## 2018-07-26 NOTE — Telephone Encounter (Signed)
Spoke with [facility] RN.  Patient and daughter quite concerned about risk of embolic stroke d/t warfarin being on hold.  Explained that with current hematoma, we would be more worried about bleeding stroke at this time.  Also that we cannot make any decisions about re-starting anticoagulation until patient has been seen in the office.  Note that patient moved PA appointment up from March 25 to 25.    RN voiced understanding, states that she has already had essentially this conversation, but wanted to review with cardiology.

## 2018-07-26 NOTE — Telephone Encounter (Signed)
Please advise. Thanks.  

## 2018-07-26 NOTE — Telephone Encounter (Signed)
Yes this is fine.

## 2018-07-26 NOTE — Telephone Encounter (Signed)
Noted. Noted: nurse phone contact with patient for TCM on 07/25/2018.  Signed:  Crissie Sickles, MD           07/26/2018

## 2018-07-27 ENCOUNTER — Ambulatory Visit (INDEPENDENT_AMBULATORY_CARE_PROVIDER_SITE_OTHER): Payer: Medicare Other | Admitting: Family Medicine

## 2018-07-27 ENCOUNTER — Encounter: Payer: Self-pay | Admitting: Family Medicine

## 2018-07-27 VITALS — BP 92/54 | HR 61 | Temp 97.7°F | Resp 16 | Ht 59.0 in | Wt 126.6 lb

## 2018-07-27 DIAGNOSIS — S32599D Other specified fracture of unspecified pubis, subsequent encounter for fracture with routine healing: Secondary | ICD-10-CM

## 2018-07-27 DIAGNOSIS — R5381 Other malaise: Secondary | ICD-10-CM

## 2018-07-27 DIAGNOSIS — D6489 Other specified anemias: Secondary | ICD-10-CM | POA: Diagnosis not present

## 2018-07-27 DIAGNOSIS — S300XXD Contusion of lower back and pelvis, subsequent encounter: Secondary | ICD-10-CM

## 2018-07-27 DIAGNOSIS — N186 End stage renal disease: Secondary | ICD-10-CM

## 2018-07-27 DIAGNOSIS — Z992 Dependence on renal dialysis: Secondary | ICD-10-CM

## 2018-07-27 DIAGNOSIS — W19XXXD Unspecified fall, subsequent encounter: Secondary | ICD-10-CM | POA: Diagnosis not present

## 2018-07-27 NOTE — Progress Notes (Signed)
07/27/2018  CC:  Chief Complaint  Patient presents with  . Hospitalization Follow-up    TCM    [name] who presents accompanied by her daugher for  hospital follow up, specifically [facility] face-to-face visit. Dates hospitalized: 3/2-3/6, 2020. Days since d/c from hospital: 5 Patient was discharged from hospital to home with home PT and also [facility] nurse. Reason for admission to hospital: pt had mechanical fall onto right side of pelvis and sustained right superior ramus and sacral ala fx.  Also a large perineal hematoma Date of interactive (phone) contact with patient and/or caregiver: 07/25/18  I have reviewed patient's discharge summary plus pertinent specific notes, labs, and imaging from the hospitalization.   Pt fell when standing w/out support and hit R hip and pelvis, sustained pelvic fractures, managed nonoperatively, also large lower gluc/perineal hematoma.  Due to blood loss in hematoma she was transfused 2 U pRBCs (Hb in 8 range, came up to , vit K for coumadin reversal was given.  PT/rehab started in hosp. She is in [facility] and needs this most of the time since being home but can stand with cane or walker and walks with these assistive devices as well--for short distances.  No further falls since home from hospital.  Was instructed to hold coumadin for a 3 weeks: instructions on d/c med list to restart coumadin 08/15/2018. This is ultimately to be determined by Dr. Gwenlyn Found, her cardiologist-has f/u with his office soon.   She remained on regularly scheduled HD in hosp.  Pain level when sitting in chair 2/10, worse when prolonged such as when getting dialyzed.  No pain lying on back. Minimal pain when she tries walking with cain/walker.   Medication reconciliation was done today and patient  is taking meds as recommended by discharging hospitalist/specialist.    PMH:  Past Medical History:  Diagnosis Date  . Anemia of chronic disease  2018   + anemia of CRI?  [name] Atrial fibrillation ([place]) [I48.91] 12/11/2016  . Atrophy of left kidney    with absent blood flow by renal artery dopplers (Dr. Gwenlyn Found)  . 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 Chronic combined systolic and diastolic CHF (congestive heart failure) ([place])    a. 11/2016: echo showing EF of 35-40%, RV strain noted, mild MR and mild TR.   Echo showed much improved EF 12/2017 (55-60%)  . Chronic respiratory failure with hypoxia ([place]) 02/11/2017   Now on chronic O2 (intermittent as of summer 2019).    . Debilitated patient    WC dependent as of 2018  . ESRD on hemodialysis ([place]) 01/2017   T/Th/Sat schedule- [place].  Right basilic AV fistula [id number]/8101--BPZ unable to successfully access this.  Dr. Donnetta Hutching to do an upper arm AV graft as of 10/2017..  . History of adenomatous polyp of colon   . History of kidney stones    passed  . History of subarachnoid hemorrhage 10/2014   after syncope and while on xarelto  . HOH (hard of hearing)   . Hyperlipidemia   . Hypertension    Difficult to control, in the setting of one functioning kidney: pt was referred to nephrology by Dr. Gwenlyn Found 06/2015.  . Lumbar radiculopathy 2012  . Lumbar spinal stenosis 2019   facet injections only very short term relief.  L1 and L2 selective nerve root blocks helpful 04/2018.  . Lumbar spondylosis    MR 07/2016---no sign of spinal nerve compression or  cord compression.  Pt set up with outpt ortho while admitted to hosp 07/2016.  . Malnourished ([place])   . Metatarsal fracture 06/10/2016   Nondisplaced, left 5th metatarsal--pt was referred to ortho  . Nonischemic cardiomyopathy ([place])    EF improved to 55-60% on echo 12/2017  . Osteopenia 2014   T-score -2.1  . PAF (paroxysmal atrial fibrillation) ([facility])    Eliquis started after BRAO and CVA.  She was changed to warfarin 2018--INR/coumadin mgmt per HD/nephrol.  . Presence of permanent cardiac pacemaker   . Right rib fracture 12/2016    s/p fall  . Sick sinus syndrome ([place]) 11/2016   Dual chamber pacer insertion 2018 (Dr. Lovena Le)  . Stroke ([place]) 16/1096   cardioembolic (had CVA while on no anticoag)--"scattered subacute punctate infarcts: 1 in R parietal lobe and 2 in occipital cortex" on MRI br.  CT angio head/neck: aortic arch athero.  R ICA 20% stenosis, L ICA w/out any stenosis.  . Thoracic back pain 01/2017; 01/2018   2018: Facet?  Dr. Ramos->steroid injection.  01/2018-->CT T spine to check for a new comp fx-->none found.  Selective nerve root inj in T spine helpful on R side.   . Thoracic compression fracture ([place]) 07/2016   T3.  T7 and T8-- T8 kyphoplasty during hosp admission 07/2016.  Neuro referred pt to pain mgmt for consideration of injection 09/2016.  I referred her to endo 08/2016 for consideration of calcitonin treatment.    PSH:  Past Surgical History:  Procedure Laterality Date  . APPENDECTOMY  child  . AV FISTULA PLACEMENT Right 03/31/2017   Procedure: Right ARM Basilic vein transposition;  Surgeon: Conrad Bee Cave, MD;  Location: [facility] OR;  Service: Vascular;  Laterality: Right;  . AV FISTULA PLACEMENT Right 12/06/2017   Procedure: INSERTION OF ARTERIOVENOUS (AV) GORE-TEX GRAFT ARM RIGHT UPPER EXTREMITY;  Surgeon: [name], MD;  Location: [facility];  Service: Vascular;  Laterality: Right;  . CARDIOVASCULAR STRESS TEST  01/31/2016   Stress myoview: NORMAL/Low risk.  EF 56%.  . [place]  . COLONOSCOPY  2015   + hx of adenomatous polyps.  Need digest health spec in [place] records to see when pt due for next colonoscopy  . EYE SURGERY Bilateral    Catarct  . INSERTION OF DIALYSIS CATHETER Right 01/29/2017   Procedure: INSERTION OF DIALYSIS CATHETER- RIGHT INTERNAL JUGULAR;  Surgeon: [name], MD;  Location: [facility] OR;  Service: Vascular;  Laterality: Right;  . IR GENERIC HISTORICAL  07/24/2016   IR KYPHO THORACIC WITH BONE BIOPSY 07/24/2016 Luanne Bras, MD [facility]-INTERV RAD  . KYPHOPLASTY  07/27/2016    T8  . PACEMAKER IMPLANT N/A 12/03/2016   Procedure: Pacemaker Implant;  Surgeon: Evans Lance, MD;  Location: [facility] LAB;  Service: Cardiovascular;  Laterality: N/A;  . Renal artery dopplers  02/26/2016   Her right renal dimension was 11 cm pole to pole with mild to moderate right renal artery stenosis. A right renal aortic ratio was 3.22 suggesting less than a 50% stenosis.  . Right upper arm AV Gore-Tex graft  12/06/2017   Dr. Donnetta Hutching  . TONSILLECTOMY    . TRANSTHORACIC ECHOCARDIOGRAM  01/29/2016; 11/2016; 12/2017   2017: EF 55-60%, normal LV wall motion, grade I DD.  No cardiac source of emboli was seen.  2018: EF 35-40%, could not assess DD due to a-fib, pulm HTN noted. 12/2017 EF 55-60%, nl wall motion, grd II DD, mod MR and pulm regurg,  increased pulm pressure (peak 52).    MEDS:  Outpatient Medications Prior to Visit  Medication Sig Dispense Refill  . acetaminophen (TYLENOL) 500 MG tablet Take 500-1,000 mg 3 (three) times daily as needed by mouth for moderate pain.     [name] amiodarone (PACERONE) 200 MG tablet TAKE 1 TABLET BY MOUTH  DAILY (Patient taking differently: Take 200 mg by mouth daily. ) 90 tablet 2  . carvedilol (COREG) 3.125 MG tablet TAKE 1 TABLET BY MOUTH TWO  TIMES DAILY (Patient taking differently: Take 3.125 mg by mouth 2 (two) times daily as needed (blood pressure). ) 180 tablet 1  . isosorbide mononitrate (IMDUR) 60 MG 24 hr tablet TAKE 1 TABLET BY MOUTH  DAILY (Patient taking differently: Take 60 mg by mouth daily. ) 90 tablet 1  . ondansetron (ZOFRAN) 4 MG tablet Take 1 tablet (4 mg total) by mouth every 8 (eight) hours as needed for nausea or vomiting. 60 tablet 1  . polyethylene glycol (MIRALAX / GLYCOLAX) packet Take 17 g by mouth 2 (two) times daily. 14 each 0  . polyvinyl alcohol (LIQUIFILM TEARS) 1.4 % ophthalmic solution Place 1-2 drops into both eyes as needed for dry eyes.    . pravastatin (PRAVACHOL) 40 MG tablet TAKE 1 TABLET BY MOUTH  EVERY EVENING  (Patient taking differently: Take 40 mg by mouth daily. ) 90 tablet 1  . sevelamer carbonate (RENVELA) 800 MG tablet Take 800 mg by mouth 3 (three) times daily with meals.     . vitamin C (ASCORBIC ACID) 500 MG tablet Take 500 mg by mouth daily.    . Vitamin D, Ergocalciferol, (DRISDOL) 1.25 MG (50000 UT) CAPS capsule Take 1 capsule (50,000 Units total) by mouth every Thursday. 12 capsule 3  . HYDROcodone-acetaminophen (NORCO) 5-325 MG tablet Take 1 tablet by mouth every 6 (six) hours as needed for moderate pain or severe pain. (Patient not taking: Reported on 07/27/2018) 20 tablet 0  . [START ON 08/15/2018] warfarin (COUMADIN) 3 MG tablet TAKE 1/2 TO 1 TABLET BY  MOUTH DAILY AS DIRECTED BY  COUMADIN CLINIC (Patient not taking: Reported on 07/27/2018) 90 tablet 1   No facility-administered medications prior to visit.    EXAM: BP (!) 92/54 (BP Location: Left Arm, Patient Position: Sitting, Cuff Size: Normal)   Pulse 61   Temp 97.7 F (36.5 C) (Oral)   Resp 16   Ht 4\' 11"  (1.499 m)   Wt 126 lb 9.6 oz (57.4 kg)   SpO2 94%   BMI 25.57 kg/m  Gen: Alert, well appearing.  Patient is oriented to person, place, time, and situation. AFFECT: pleasant, lucid thought and speech. IDP:OEUM: no injection, icteris, swelling, or exudate.  EOMI, PERRLA. Mouth: lips without lesion/swelling.  Oral mucosa pink and a bit tacky. Oropharynx without erythema, exudate, or swelling.  Neck - No masses or thyromegaly or limitation in range of motion CV: RRR, 2/6 systolic murmur, no r/g.   LUNGS: CTA bilat, nonlabored resps, good aeration in all lung fields. EXT: no c/c/e   Pertinent labs/imaging Lab Results  Component Value Date   TSH 1.097 12/15/2017   Lab Results  Component Value Date   WBC 8.0 07/22/2018   HGB 11.8 (L) 07/22/2018   HCT 37.2 07/22/2018   MCV 95.4 07/22/2018   PLT 164 07/22/2018   Lab Results  Component Value Date   IRON 60 06/23/2018   TIBC 193 06/23/2018   FERRITIN 516 06/10/2017    Lab Results  Component Value Date  INR 1.0 07/22/2018   INR 1.0 07/21/2018   INR 1.1 07/20/2018   PROTIME 22.9 (A) 06/30/2018   PROTIME 24.3 (A) 06/23/2018    Lab Results  Component Value Date   CREATININE 2.41 (H) 07/22/2018   BUN 14 07/22/2018   NA 137 07/22/2018   K 3.9 07/22/2018   CL 100 07/22/2018   CO2 26 07/22/2018   Lab Results  Component Value Date   ALT 9 07/18/2018   AST 17 07/18/2018   ALKPHOS 59 07/18/2018   BILITOT 0.5 07/18/2018   Lab Results  Component Value Date   CHOL 194 03/28/2018   Lab Results  Component Value Date   HDL 62.10 03/28/2018   Lab Results  Component Value Date   LDLCALC 72 06/17/2016   Lab Results  Component Value Date   TRIG 210.0 (H) 03/28/2018   Lab Results  Component Value Date   CHOLHDL 3 03/28/2018   Lab Results  Component Value Date   HGBA1C 5.2 12/15/2016    ASSESSMENT/PLAN:    Chemistry      Component Value Date/Time   NA 137 07/22/2018 0251   NA 138 06/23/2018   K 3.9 07/22/2018 0251   CL 100 07/22/2018 0251   CO2 26 07/22/2018 0251   BUN 14 07/22/2018 0251   BUN 30 (A) 06/23/2018   CREATININE 2.41 (H) 07/22/2018 0251   CREATININE 1.63 (H) 03/24/2016 0110   GLU 128 09/01/2016      Component Value Date/Time   CALCIUM 8.2 (L) 07/22/2018 0251   CALCIUM 9.5 02/16/2017 0232   ALKPHOS 59 07/18/2018 2236   AST 17 07/18/2018 2236   ALT 9 07/18/2018 2236   BILITOT 0.5 07/18/2018 2236   BILITOT 0.2 06/17/2016 0819     .  1) Mechanical fall; instablility/debilitated pt. Sustained perineum contusion and pelvic fractures. Pain is minimal.  No extension of hematoma or ecchymoses. Getting home PT and nursing.  2) Blood transfusion: done in hosp for blood loss into the hematoma. This has raised her ECFV, wt is up, but no signif sign of volume overload. She is due for HD tomorrow.  3) Anemia, secondary to CRI and recent acute blood loss/hematoma-->she got 2 U pRBCs and she says she got blood work  done at HD yesterday.  Will obtain these labs and make sure Hb was done. If not, we'll request this to be done at HD tomorrow.  No blood drawn here today.  4) A-fib/sick sinus syndrome/hx of pacer:  Coumadin use certainly complicated her fall. She is to be off coumadin until 08/14/17 and at that time a decision will be made in conjunction with her cardiologist on whether or not to restart this anticoagulant (risk>benefit?).  5) ESRD: continuing HD T/Th/St--next one is tomorrow.  Medical decision making of moderate complexity was utilized today.  An After Visit Summary was printed and given to the patient.  FOLLOW UP:  3 mo RCI  Signed:  Crissie Sickles, MD           07/27/2018

## 2018-07-28 DIAGNOSIS — E8770 Fluid overload, unspecified: Secondary | ICD-10-CM | POA: Diagnosis not present

## 2018-07-28 DIAGNOSIS — D689 Coagulation defect, unspecified: Secondary | ICD-10-CM | POA: Diagnosis not present

## 2018-07-28 DIAGNOSIS — E876 Hypokalemia: Secondary | ICD-10-CM | POA: Diagnosis not present

## 2018-07-28 DIAGNOSIS — D509 Iron deficiency anemia, unspecified: Secondary | ICD-10-CM | POA: Diagnosis not present

## 2018-07-28 DIAGNOSIS — N2581 Secondary hyperparathyroidism of renal origin: Secondary | ICD-10-CM | POA: Diagnosis not present

## 2018-07-28 DIAGNOSIS — N186 End stage renal disease: Secondary | ICD-10-CM | POA: Diagnosis not present

## 2018-07-28 NOTE — Telephone Encounter (Signed)
Valerie Terrell advised and voiced understanding.

## 2018-07-30 DIAGNOSIS — N2581 Secondary hyperparathyroidism of renal origin: Secondary | ICD-10-CM | POA: Diagnosis not present

## 2018-07-30 DIAGNOSIS — I120 Hypertensive chronic kidney disease with stage 5 chronic kidney disease or end stage renal disease: Secondary | ICD-10-CM | POA: Diagnosis not present

## 2018-07-30 DIAGNOSIS — D509 Iron deficiency anemia, unspecified: Secondary | ICD-10-CM | POA: Diagnosis not present

## 2018-07-30 DIAGNOSIS — Z79891 Long term (current) use of opiate analgesic: Secondary | ICD-10-CM | POA: Diagnosis not present

## 2018-07-30 DIAGNOSIS — I480 Paroxysmal atrial fibrillation: Secondary | ICD-10-CM | POA: Diagnosis not present

## 2018-07-30 DIAGNOSIS — D638 Anemia in other chronic diseases classified elsewhere: Secondary | ICD-10-CM | POA: Diagnosis not present

## 2018-07-30 DIAGNOSIS — D689 Coagulation defect, unspecified: Secondary | ICD-10-CM | POA: Diagnosis not present

## 2018-07-30 DIAGNOSIS — Z992 Dependence on renal dialysis: Secondary | ICD-10-CM | POA: Diagnosis not present

## 2018-07-30 DIAGNOSIS — S32511D Fracture of superior rim of right pubis, subsequent encounter for fracture with routine healing: Secondary | ICD-10-CM | POA: Diagnosis not present

## 2018-07-30 DIAGNOSIS — Z7901 Long term (current) use of anticoagulants: Secondary | ICD-10-CM | POA: Diagnosis not present

## 2018-07-30 DIAGNOSIS — N186 End stage renal disease: Secondary | ICD-10-CM | POA: Diagnosis not present

## 2018-07-30 DIAGNOSIS — E8770 Fluid overload, unspecified: Secondary | ICD-10-CM | POA: Diagnosis not present

## 2018-07-30 DIAGNOSIS — W1830XD Fall on same level, unspecified, subsequent encounter: Secondary | ICD-10-CM | POA: Diagnosis not present

## 2018-07-30 DIAGNOSIS — E876 Hypokalemia: Secondary | ICD-10-CM | POA: Diagnosis not present

## 2018-07-30 DIAGNOSIS — D62 Acute posthemorrhagic anemia: Secondary | ICD-10-CM | POA: Diagnosis not present

## 2018-08-01 ENCOUNTER — Telehealth: Payer: Self-pay | Admitting: *Deleted

## 2018-08-01 ENCOUNTER — Encounter: Payer: Self-pay | Admitting: Family Medicine

## 2018-08-01 DIAGNOSIS — I480 Paroxysmal atrial fibrillation: Secondary | ICD-10-CM | POA: Diagnosis not present

## 2018-08-01 DIAGNOSIS — Z992 Dependence on renal dialysis: Secondary | ICD-10-CM | POA: Diagnosis not present

## 2018-08-01 DIAGNOSIS — Z79891 Long term (current) use of opiate analgesic: Secondary | ICD-10-CM | POA: Diagnosis not present

## 2018-08-01 DIAGNOSIS — W1830XD Fall on same level, unspecified, subsequent encounter: Secondary | ICD-10-CM | POA: Diagnosis not present

## 2018-08-01 DIAGNOSIS — N186 End stage renal disease: Secondary | ICD-10-CM | POA: Diagnosis not present

## 2018-08-01 DIAGNOSIS — I120 Hypertensive chronic kidney disease with stage 5 chronic kidney disease or end stage renal disease: Secondary | ICD-10-CM | POA: Diagnosis not present

## 2018-08-01 DIAGNOSIS — Z7901 Long term (current) use of anticoagulants: Secondary | ICD-10-CM | POA: Diagnosis not present

## 2018-08-01 DIAGNOSIS — D62 Acute posthemorrhagic anemia: Secondary | ICD-10-CM | POA: Diagnosis not present

## 2018-08-01 DIAGNOSIS — S32511D Fracture of superior rim of right pubis, subsequent encounter for fracture with routine healing: Secondary | ICD-10-CM | POA: Diagnosis not present

## 2018-08-01 DIAGNOSIS — D638 Anemia in other chronic diseases classified elsewhere: Secondary | ICD-10-CM | POA: Diagnosis not present

## 2018-08-01 NOTE — Progress Notes (Signed)
Lab results 06/23/18

## 2018-08-01 NOTE — Telephone Encounter (Signed)
Copied from [place] [id number]. Topic: General - Other >> Aug 01, 2018  3:24 PM Margot Ables wrote: Caller/Agency: Morey Hummingbird RN with [facility] Number: [phone], secure VM if not available Requesting OT/PT/Skilled Nursing/Social Work/Speech Therapy:SN Frequency: notifying MD that pt refused SN visit today, pt had PT today, SN is scheduled for another visit later in the week  No call back needed.

## 2018-08-01 NOTE — Telephone Encounter (Signed)
Please advise. Thanks.  

## 2018-08-01 NOTE — Telephone Encounter (Signed)
Looks like she just declined the skilled nursing visit and at the end of the message it says no call back needed.-thx

## 2018-08-02 DIAGNOSIS — E876 Hypokalemia: Secondary | ICD-10-CM | POA: Diagnosis not present

## 2018-08-02 DIAGNOSIS — E8770 Fluid overload, unspecified: Secondary | ICD-10-CM | POA: Diagnosis not present

## 2018-08-02 DIAGNOSIS — N2581 Secondary hyperparathyroidism of renal origin: Secondary | ICD-10-CM | POA: Diagnosis not present

## 2018-08-02 DIAGNOSIS — D509 Iron deficiency anemia, unspecified: Secondary | ICD-10-CM | POA: Diagnosis not present

## 2018-08-02 DIAGNOSIS — N186 End stage renal disease: Secondary | ICD-10-CM | POA: Diagnosis not present

## 2018-08-02 DIAGNOSIS — D689 Coagulation defect, unspecified: Secondary | ICD-10-CM | POA: Diagnosis not present

## 2018-08-02 NOTE — Progress Notes (Signed)
Cardiology Office Note   Date:  08/03/2018   ID:  Valerie Terrell, DOB 1933/03/05, MRN [id number]  PCP:  Tammi Sou, MD  Cardiologist:  Dr. Quay Burow, MD  Chief Complaint  Patient presents with  . Follow-up  . Atrial Fibrillation    History of Present Illness: Valerie Terrell is a 85 y.o. female who presents for post hospital follow up, seen for Dr. Gwenlyn Terrell.   Pleasant 85y/o female with a history of PAF. She had been placed on Xarelto in the past but apparently had orthostatic syncope and suffered an [place] in 2016 after a falland Xarelto was discontinued and she as placed on ASA. She presented 01/30/16 with a retinal artery occlusion and evidence of bilateral micro emboli on MRI c/w embolic stroke. After consultation with the Neurologist she was placed on Eliquis 5 mg BID. During thathospitalization she had also complained of chest pain and had a slightly elevated troponin. Echo and Myoview were normal. In July 2018 she was admitted and had a [employer] pacemaker placed for PAF and tachy-brady syndrome.  She has had labile HTN in the setting of renal artery stenosis and in 01/2017 she started HD.  Eliquis was changed to Coumadin at that time. In 11/2016 her EF was 35-40% but by 11/2017 this had improved to 50-55%.   Unfortunately, Valerie Terrell sustained a mechanical fall and was admitted to the hospital with a pelvic fracture and large perineal hematoma. Her Coumadin was held in the setting of above with acute blood loss and a Hb of 7.9. Plan was to hold Coumadin for 3 weeks per discharge summary. Pt was seen by her PCP 07/27/18 with plan to restart Coumadin on 08/15/18 once Hb stable. On 07/22/2018, Hb level was noted to be 11.8 which appears to be at her baseline. Pharmacyresponse  Per phone encounter with recommendations to hold for 2-3 weeks If re-start is acceptable, should schedule INR check for 7-10 days after re-start.       She was last seen in the office 05/17/2018 and was doing well.  She was noted to be living at home with her daughter. She had sustained a fall and had a CT that did not show a fracture. They considered an MRI but with her PPM this was not done. From a cardiac standpoint, she was doing well with no increase in her SOB, LE swelling or palpitations. PPM was checked remotely in July and was functioning normally.    Today she presents with her daughter and they are concerned about her risk of stroke being off of her Coumadin. She has had two significant falls since 2017, one resulting in [facility] and one resulting in pelvic fracture and large perineal hematoma that required multiple transfusions and holding her Coumadin for at least two weeks per d/c note. Pt declined SNF at hospital discharge.   She denies chest pain, palpitations, dizziness, syncope, LE swelling, weight gain or orthopnea symptoms. HSHe reports that she has been well without much pain after discharge home. She ambulates with a rolling walker and has two sessions of home health PT left. I discussed the case with Dr. Gwenlyn Terrell, her primary cardiologist, who aggress that at this point, given her multiple severe falls and small risk of stroke associated with PAF, she is no longer a Coumadin candidate. I have discussed this with the patient and family and they are in agreement of the plan and understand that the risk of further falls with [facility] (which she had in  2017) could be more detrimental at this point.    Past Medical History:  Diagnosis Date  . Anemia of chronic disease 2018   + anemia of CRI?  [name] Atrial fibrillation ([place]) [I48.91] 12/11/2016  . Atrophy of left kidney    with absent blood flow by renal artery dopplers (Dr. Gwenlyn Terrell)  . 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 Chronic combined systolic and diastolic CHF (congestive heart failure) ([place])    a. 11/2016: echo showing EF of 35-40%, RV strain noted, mild MR and mild TR.   Echo showed much improved EF 12/2017 (55-60%)  . Chronic respiratory  failure with hypoxia ([place]) 02/11/2017   Now on chronic O2 (intermittent as of summer 2019).    . Debilitated patient    WC dependent as of 2018  . ESRD on hemodialysis ([place]) 01/2017   T/Th/Sat schedule- [place].  Right basilic AV fistula 37/3428--JGO unable to successfully access this.  Dr. Donnetta Hutching to do an upper arm AV graft as of 10/2017..  . History of adenomatous polyp of colon   . History of kidney stones    passed  . History of subarachnoid hemorrhage 10/2014   after syncope and while on xarelto  . HOH (hard of hearing)   . Hyperlipidemia   . Hypertension    Difficult to control, in the setting of one functioning kidney: pt was referred to nephrology by Dr. Gwenlyn Terrell 06/2015.  . Lumbar radiculopathy 2012  . Lumbar spinal stenosis 2019   facet injections only very short term relief.  L1 and L2 selective nerve root blocks helpful 04/2018.  . Lumbar spondylosis    MR 07/2016---no sign of spinal nerve compression or cord compression.  Pt set up with outpt ortho while admitted to hosp 07/2016.  . Malnourished ([place])   . Metatarsal fracture 06/10/2016   Nondisplaced, left 5th metatarsal--pt was referred to ortho  . Nonischemic cardiomyopathy ([place])    EF improved to 55-60% on echo 12/2017  . Osteopenia 2014   T-score -2.1  . PAF (paroxysmal atrial fibrillation) ([facility])    Eliquis started after BRAO and CVA.  She was changed to warfarin 2018--INR/coumadin mgmt per HD/nephrol.  . Presence of permanent cardiac pacemaker   . Right rib fracture 12/2016   s/p fall  . Sick sinus syndrome ([place]) 11/2016   Dual chamber pacer insertion 2018 (Dr. Lovena Le)  . Stroke ([place]) 03/5725   cardioembolic (had CVA while on no anticoag)--"scattered subacute punctate infarcts: 1 in R parietal lobe and 2 in occipital cortex" on MRI br.  CT angio head/neck: aortic arch athero.  R ICA 20% stenosis, L ICA w/out any stenosis.  . Thoracic back pain 01/2017; 01/2018   2018: Facet?  Dr. Ramos->steroid injection.   01/2018-->CT T spine to check for a new comp fx-->none Terrell.  Selective nerve root inj in T spine helpful on R side.   . Thoracic compression fracture ([place]) 07/2016   T3.  T7 and T8-- T8 kyphoplasty during hosp admission 07/2016.  Neuro referred pt to pain mgmt for consideration of injection 09/2016.  I referred her to endo 08/2016 for consideration of calcitonin treatment.    Past Surgical History:  Procedure Laterality Date  . APPENDECTOMY  child  . AV FISTULA PLACEMENT Right 03/31/2017   Procedure: Right ARM Basilic vein transposition;  Surgeon: Conrad Rennerdale, MD;  Location: [facility];  Service: Vascular;  Laterality: Right;  . AV FISTULA PLACEMENT Right 12/06/2017   Procedure: INSERTION OF ARTERIOVENOUS (  AV) GORE-TEX GRAFT ARM RIGHT UPPER EXTREMITY;  Surgeon: [name], MD;  Location: [facility];  Service: Vascular;  Laterality: Right;  . CARDIOVASCULAR STRESS TEST  01/31/2016   Stress myoview: NORMAL/Low risk.  EF 56%.  . [place]  . COLONOSCOPY  2015   + hx of adenomatous polyps.  Need digest health spec in [place] records to see when pt due for next colonoscopy  . EYE SURGERY Bilateral    Catarct  . INSERTION OF DIALYSIS CATHETER Right 01/29/2017   Procedure: INSERTION OF DIALYSIS CATHETER- RIGHT INTERNAL JUGULAR;  Surgeon: [name], MD;  Location: [facility] OR;  Service: Vascular;  Laterality: Right;  . IR GENERIC HISTORICAL  07/24/2016   IR KYPHO THORACIC WITH BONE BIOPSY 07/24/2016 Luanne Bras, MD [facility]-INTERV RAD  . KYPHOPLASTY  07/27/2016   T8  . PACEMAKER IMPLANT N/A 12/03/2016   Procedure: Pacemaker Implant;  Surgeon: Evans Lance, MD;  Location: [place] CV LAB;  Service: Cardiovascular;  Laterality: N/A;  . Renal artery dopplers  02/26/2016   Her right renal dimension was 11 cm pole to pole with mild to moderate right renal artery stenosis. A right renal aortic ratio was 3.22 suggesting less than a 50% stenosis.  . Right upper arm AV Gore-Tex graft  12/06/2017    Dr. Donnetta Hutching  . TONSILLECTOMY    . TRANSTHORACIC ECHOCARDIOGRAM  01/29/2016; 11/2016; 12/2017   2017: EF 55-60%, normal LV wall motion, grade I DD.  No cardiac source of emboli was seen.  2018: EF 35-40%, could not assess DD due to a-fib, pulm HTN noted. 12/2017 EF 55-60%, nl wall motion, grd II DD, mod MR and pulm regurg, increased pulm pressure (peak 52).     Current Outpatient Medications  Medication Sig Dispense Refill  . acetaminophen (TYLENOL) 500 MG tablet Take 500-1,000 mg 3 (three) times daily as needed by mouth for moderate pain.     [name] amiodarone (PACERONE) 200 MG tablet TAKE 1 TABLET BY MOUTH  DAILY (Patient taking differently: Take 200 mg by mouth daily. ) 90 tablet 2  . carvedilol (COREG) 3.125 MG tablet TAKE 1 TABLET BY MOUTH TWO  TIMES DAILY (Patient taking differently: Take 3.125 mg by mouth 2 (two) times daily as needed (blood pressure). ) 180 tablet 1  . isosorbide mononitrate (IMDUR) 60 MG 24 hr tablet TAKE 1 TABLET BY MOUTH  DAILY (Patient taking differently: Take 60 mg by mouth daily. ) 90 tablet 1  . ondansetron (ZOFRAN) 4 MG tablet Take 1 tablet (4 mg total) by mouth every 8 (eight) hours as needed for nausea or vomiting. 60 tablet 1  . polyethylene glycol (MIRALAX / GLYCOLAX) packet Take 17 g by mouth 2 (two) times daily. 14 each 0  . polyvinyl alcohol (LIQUIFILM TEARS) 1.4 % ophthalmic solution Place 1-2 drops into both eyes as needed for dry eyes.    . pravastatin (PRAVACHOL) 40 MG tablet TAKE 1 TABLET BY MOUTH  EVERY EVENING (Patient taking differently: Take 40 mg by mouth daily. ) 90 tablet 1  . sevelamer carbonate (RENVELA) 800 MG tablet Take 800 mg by mouth 3 (three) times daily with meals.     . vitamin C (ASCORBIC ACID) 500 MG tablet Take 500 mg by mouth daily.    . Vitamin D, Ergocalciferol, (DRISDOL) 1.25 MG (50000 UT) CAPS capsule Take 1 capsule (50,000 Units total) by mouth every Thursday. 12 capsule 3   No current facility-administered medications for this visit.  Allergies:   Clonidine derivatives; Adhesive [tape]; Codeine; Nickel; and Sulfa antibiotics    Social History:  The patient  reports that she quit smoking about 46 years ago. She quit after 22.00 years of use. She has never used smokeless tobacco. She reports current alcohol use of about 1.0 standard drinks of alcohol per week. She reports that she does not use drugs.   Family History:  The patient's family history includes Cancer in her mother; Early death in her father; Heart disease in her father.    ROS:  Please see the history of present illness.   Otherwise, review of systems are positive for none. All other systems are reviewed and negative.    PHYSICAL EXAM: VS:  BP (!) 154/61   Pulse 76   Ht 4\' 11"  (1.499 m)   Wt 122 lb 9.6 oz (55.6 kg)   SpO2 93%   BMI 24.76 kg/m  , BMI Body mass index is 24.76 kg/m.   General: Elderly, NAD Skin: Warm, dry, intact  Head: Normocephalic, atraumatic, clear, moist mucus membranes. Neck: Negative for carotid bruits. No JVD Lungs:Clear to ausculation bilaterally. No wheezes, rales, or rhonchi. Breathing is unlabored. Cardiovascular: RRR with S1 S2. No murmurs, rubs, gallops, or LV heave appreciated. MSK: Strength and tone appear normal for age. 5/5 in all extremities Extremities: No edema. No clubbing or cyanosis. DP/PT pulses 2+ bilaterally Neuro: Alert and oriented. No focal deficits. No facial asymmetry. MAE spontaneously. Psych: Responds to questions appropriately with normal affect.     EKG:  EKG is not ordered today.   Recent Labs: 12/14/2017: B Natriuretic Peptide 1,971.8 12/15/2017: TSH 1.097 07/18/2018: ALT 9 07/22/2018: BUN 14; Creatinine, Ser 2.41; Hemoglobin 11.8; Platelets 164; Potassium 3.9; Sodium 137   Lipid Panel    Component Value Date/Time   CHOL 194 03/28/2018 1035   CHOL 140 06/17/2016 0819   TRIG 210.0 (H) 03/28/2018 1035   HDL 62.10 03/28/2018 1035   HDL 39 (L) 06/17/2016 0819   CHOLHDL 3 03/28/2018 1035    VLDL 42.0 (H) 03/28/2018 1035   LDLCALC 72 06/17/2016 0819   LDLDIRECT 98.0 03/28/2018 1035      Wt Readings from Last 3 Encounters:  08/03/18 122 lb 9.6 oz (55.6 kg)  07/27/18 126 lb 9.6 oz (57.4 kg)  07/22/18 123 lb 0.3 oz (55.8 kg)    Other studies Reviewed: Additional studies/ records that were reviewed today include:   Echocardiogram 12/15/2017: Study Conclusions  - Left ventricle: The cavity size was normal. Wall thickness was   increased in a pattern of mild LVH. Systolic function was normal.   The estimated ejection fraction was in the range of 55% to 60%.   Wall motion was normal; there were no regional wall motion   abnormalities. Features are consistent with a pseudonormal left   ventricular filling pattern, with concomitant abnormal relaxation   and increased filling pressure (grade 2 diastolic dysfunction). - Aortic valve: Trileaflet; moderately thickened, moderately   calcified leaflets. - Mitral valve: Calcified annulus. There was mild to moderate   regurgitation. - Left atrium: The atrium was moderately dilated. - Tricuspid valve: There was mild regurgitation. - Pulmonic valve: There was moderate regurgitation. - Pulmonary arteries: Systolic pressure was moderately increased.   PA peak pressure: 52 mm Hg (S).  Impressions:  - When compared to prior, EF is improved. (prior 35%)  ASSESSMENT AND PLAN:  1. Hx of paroxysmal atrial fibrillation: -Pt initially placed on Xarelto>>transisiotned to Coumadin after the need for HD -  Sustained SAH in 2017>>restarted [facility] -Recent hospitalization s/p pelvic fracture with large perineal hematoma in which Coumadin was held for 3 weeks post discharge per primary service -Last Hb stable at 11.1 -Discussed case with primary cardiologist who feels that given her recurrent mechanical falls with significant injury, the patient is no longer a Coumadin candidate -Last INR, 1.0 on 07/22/18 -CHADs VASc= 5  2. Chronic combined  systolic and diastolic heart failure: -Fluid volume managed with HD -Appears euvolemic on exam today    3. Essential hypertension: -Mildly elevated today -Will have her monitor at home  -Consider increasing carvedilol if continues to be above normal    4. ESRD on HD: -Continue HD  T>TH>Sat -Follows with Dr. Buelah Manis   5. Non-Ischemic cardiomyopathy: -Most recent echocardiogram with LVEF of 50-55% 11/2017 -Prior LV function noted to be 35-40% 11/2016  6. Hx of SSS: -PPM was checked remotely on 06/08/2018 and was functioning normally    Current medicines are reviewed at length with the patient today.  The patient does not have concerns regarding medicines.  The following changes have been made:  Stop Coumadin   Labs/ tests ordered today include: None No orders of the defined types were placed in this encounter.   Disposition:   FU with Dr. Gwenlyn Terrell in 4 months  Signed, Kathyrn Drown, NP  08/03/2018 3:11 PM    [address], [place], [place]  [address] Phone: [phone]; Fax: [phone]

## 2018-08-03 ENCOUNTER — Ambulatory Visit (INDEPENDENT_AMBULATORY_CARE_PROVIDER_SITE_OTHER): Payer: Medicare Other | Admitting: Cardiology

## 2018-08-03 ENCOUNTER — Encounter: Payer: Self-pay | Admitting: Cardiology

## 2018-08-03 VITALS — BP 154/61 | HR 76 | Ht 59.0 in | Wt 122.6 lb

## 2018-08-03 DIAGNOSIS — I10 Essential (primary) hypertension: Secondary | ICD-10-CM | POA: Diagnosis not present

## 2018-08-03 DIAGNOSIS — I480 Paroxysmal atrial fibrillation: Secondary | ICD-10-CM | POA: Diagnosis not present

## 2018-08-03 DIAGNOSIS — Z8679 Personal history of other diseases of the circulatory system: Secondary | ICD-10-CM | POA: Diagnosis not present

## 2018-08-03 DIAGNOSIS — I5041 Acute combined systolic (congestive) and diastolic (congestive) heart failure: Secondary | ICD-10-CM | POA: Diagnosis not present

## 2018-08-03 DIAGNOSIS — I428 Other cardiomyopathies: Secondary | ICD-10-CM

## 2018-08-03 DIAGNOSIS — N186 End stage renal disease: Secondary | ICD-10-CM | POA: Diagnosis not present

## 2018-08-03 DIAGNOSIS — Z992 Dependence on renal dialysis: Secondary | ICD-10-CM

## 2018-08-03 DIAGNOSIS — I495 Sick sinus syndrome: Secondary | ICD-10-CM

## 2018-08-03 NOTE — Patient Instructions (Addendum)
Medication Instructions:  STOP Warfarin If you need a refill on your cardiac medications before your next appointment, please call your pharmacy.   Lab work: None  If you have labs (blood work) drawn today and your tests are completely normal, you will receive your results only by: [name] MyChart Message (if you have MyChart) OR . A paper copy in the mail If you have any lab test that is abnormal or we need to change your treatment, we will call you to review the results.  Testing/Procedures: None   Follow-Up: At [facility], you and your health needs are our priority.  As part of our continuing mission to provide you with exceptional heart care, we have created designated Provider Care Teams.  These Care Teams include your primary Cardiologist (physician) and Advanced Practice Providers (APPs -  Physician Assistants and Nurse Practitioners) who all work together to provide you with the care you need, when you need it. You will need a follow up appointment in 4 months. Please call our office 2 months in advance to schedule this appointment. You may see Quay Burow, MD or one of the following Advanced Practice Providers on your designated Care Team:   Kerin Ransom, PA-C Roby Lofts, [place] . Sande Rives, PA-C  Any Other Special Instructions Will Be Listed Below (If Applicable). Cardiac clearance form completed-COPY OF FORM SCANNED IN TO PATIENTS CHART

## 2018-08-04 DIAGNOSIS — I120 Hypertensive chronic kidney disease with stage 5 chronic kidney disease or end stage renal disease: Secondary | ICD-10-CM | POA: Diagnosis not present

## 2018-08-04 DIAGNOSIS — D689 Coagulation defect, unspecified: Secondary | ICD-10-CM | POA: Diagnosis not present

## 2018-08-04 DIAGNOSIS — N2581 Secondary hyperparathyroidism of renal origin: Secondary | ICD-10-CM | POA: Diagnosis not present

## 2018-08-04 DIAGNOSIS — E8770 Fluid overload, unspecified: Secondary | ICD-10-CM | POA: Diagnosis not present

## 2018-08-04 DIAGNOSIS — I480 Paroxysmal atrial fibrillation: Secondary | ICD-10-CM

## 2018-08-04 DIAGNOSIS — D62 Acute posthemorrhagic anemia: Secondary | ICD-10-CM | POA: Diagnosis not present

## 2018-08-04 DIAGNOSIS — Z79891 Long term (current) use of opiate analgesic: Secondary | ICD-10-CM

## 2018-08-04 DIAGNOSIS — W1830XD Fall on same level, unspecified, subsequent encounter: Secondary | ICD-10-CM

## 2018-08-04 DIAGNOSIS — D638 Anemia in other chronic diseases classified elsewhere: Secondary | ICD-10-CM | POA: Diagnosis not present

## 2018-08-04 DIAGNOSIS — S32511D Fracture of superior rim of right pubis, subsequent encounter for fracture with routine healing: Secondary | ICD-10-CM | POA: Diagnosis not present

## 2018-08-04 DIAGNOSIS — D509 Iron deficiency anemia, unspecified: Secondary | ICD-10-CM | POA: Diagnosis not present

## 2018-08-04 DIAGNOSIS — E876 Hypokalemia: Secondary | ICD-10-CM | POA: Diagnosis not present

## 2018-08-04 DIAGNOSIS — Z992 Dependence on renal dialysis: Secondary | ICD-10-CM

## 2018-08-04 DIAGNOSIS — Z7901 Long term (current) use of anticoagulants: Secondary | ICD-10-CM

## 2018-08-04 DIAGNOSIS — N186 End stage renal disease: Secondary | ICD-10-CM | POA: Diagnosis not present

## 2018-08-05 DIAGNOSIS — E876 Hypokalemia: Secondary | ICD-10-CM | POA: Diagnosis not present

## 2018-08-05 DIAGNOSIS — N186 End stage renal disease: Secondary | ICD-10-CM | POA: Diagnosis not present

## 2018-08-05 DIAGNOSIS — D689 Coagulation defect, unspecified: Secondary | ICD-10-CM | POA: Diagnosis not present

## 2018-08-05 DIAGNOSIS — N2581 Secondary hyperparathyroidism of renal origin: Secondary | ICD-10-CM | POA: Diagnosis not present

## 2018-08-05 DIAGNOSIS — D509 Iron deficiency anemia, unspecified: Secondary | ICD-10-CM | POA: Diagnosis not present

## 2018-08-05 DIAGNOSIS — E8770 Fluid overload, unspecified: Secondary | ICD-10-CM | POA: Diagnosis not present

## 2018-08-06 DIAGNOSIS — Z79891 Long term (current) use of opiate analgesic: Secondary | ICD-10-CM | POA: Diagnosis not present

## 2018-08-06 DIAGNOSIS — D62 Acute posthemorrhagic anemia: Secondary | ICD-10-CM | POA: Diagnosis not present

## 2018-08-06 DIAGNOSIS — W1830XD Fall on same level, unspecified, subsequent encounter: Secondary | ICD-10-CM | POA: Diagnosis not present

## 2018-08-06 DIAGNOSIS — D689 Coagulation defect, unspecified: Secondary | ICD-10-CM | POA: Diagnosis not present

## 2018-08-06 DIAGNOSIS — N2581 Secondary hyperparathyroidism of renal origin: Secondary | ICD-10-CM | POA: Diagnosis not present

## 2018-08-06 DIAGNOSIS — S32511D Fracture of superior rim of right pubis, subsequent encounter for fracture with routine healing: Secondary | ICD-10-CM | POA: Diagnosis not present

## 2018-08-06 DIAGNOSIS — I120 Hypertensive chronic kidney disease with stage 5 chronic kidney disease or end stage renal disease: Secondary | ICD-10-CM | POA: Diagnosis not present

## 2018-08-06 DIAGNOSIS — I480 Paroxysmal atrial fibrillation: Secondary | ICD-10-CM | POA: Diagnosis not present

## 2018-08-06 DIAGNOSIS — Z7901 Long term (current) use of anticoagulants: Secondary | ICD-10-CM | POA: Diagnosis not present

## 2018-08-06 DIAGNOSIS — Z992 Dependence on renal dialysis: Secondary | ICD-10-CM | POA: Diagnosis not present

## 2018-08-06 DIAGNOSIS — D509 Iron deficiency anemia, unspecified: Secondary | ICD-10-CM | POA: Diagnosis not present

## 2018-08-06 DIAGNOSIS — D638 Anemia in other chronic diseases classified elsewhere: Secondary | ICD-10-CM | POA: Diagnosis not present

## 2018-08-06 DIAGNOSIS — E8770 Fluid overload, unspecified: Secondary | ICD-10-CM | POA: Diagnosis not present

## 2018-08-06 DIAGNOSIS — N186 End stage renal disease: Secondary | ICD-10-CM | POA: Diagnosis not present

## 2018-08-06 DIAGNOSIS — E876 Hypokalemia: Secondary | ICD-10-CM | POA: Diagnosis not present

## 2018-08-08 DIAGNOSIS — N186 End stage renal disease: Secondary | ICD-10-CM | POA: Diagnosis not present

## 2018-08-08 DIAGNOSIS — Z992 Dependence on renal dialysis: Secondary | ICD-10-CM | POA: Diagnosis not present

## 2018-08-08 DIAGNOSIS — D638 Anemia in other chronic diseases classified elsewhere: Secondary | ICD-10-CM | POA: Diagnosis not present

## 2018-08-08 DIAGNOSIS — Z7901 Long term (current) use of anticoagulants: Secondary | ICD-10-CM | POA: Diagnosis not present

## 2018-08-08 DIAGNOSIS — I120 Hypertensive chronic kidney disease with stage 5 chronic kidney disease or end stage renal disease: Secondary | ICD-10-CM | POA: Diagnosis not present

## 2018-08-08 DIAGNOSIS — I480 Paroxysmal atrial fibrillation: Secondary | ICD-10-CM | POA: Diagnosis not present

## 2018-08-08 DIAGNOSIS — W1830XD Fall on same level, unspecified, subsequent encounter: Secondary | ICD-10-CM | POA: Diagnosis not present

## 2018-08-08 DIAGNOSIS — D62 Acute posthemorrhagic anemia: Secondary | ICD-10-CM | POA: Diagnosis not present

## 2018-08-08 DIAGNOSIS — S32511D Fracture of superior rim of right pubis, subsequent encounter for fracture with routine healing: Secondary | ICD-10-CM | POA: Diagnosis not present

## 2018-08-08 DIAGNOSIS — Z79891 Long term (current) use of opiate analgesic: Secondary | ICD-10-CM | POA: Diagnosis not present

## 2018-08-09 DIAGNOSIS — E876 Hypokalemia: Secondary | ICD-10-CM | POA: Diagnosis not present

## 2018-08-09 DIAGNOSIS — N186 End stage renal disease: Secondary | ICD-10-CM | POA: Diagnosis not present

## 2018-08-09 DIAGNOSIS — D509 Iron deficiency anemia, unspecified: Secondary | ICD-10-CM | POA: Diagnosis not present

## 2018-08-09 DIAGNOSIS — N2581 Secondary hyperparathyroidism of renal origin: Secondary | ICD-10-CM | POA: Diagnosis not present

## 2018-08-09 DIAGNOSIS — D689 Coagulation defect, unspecified: Secondary | ICD-10-CM | POA: Diagnosis not present

## 2018-08-09 DIAGNOSIS — E8770 Fluid overload, unspecified: Secondary | ICD-10-CM | POA: Diagnosis not present

## 2018-08-10 ENCOUNTER — Ambulatory Visit: Payer: Self-pay | Admitting: Adult Health

## 2018-08-11 DIAGNOSIS — N2581 Secondary hyperparathyroidism of renal origin: Secondary | ICD-10-CM | POA: Diagnosis not present

## 2018-08-11 DIAGNOSIS — D509 Iron deficiency anemia, unspecified: Secondary | ICD-10-CM | POA: Diagnosis not present

## 2018-08-11 DIAGNOSIS — E876 Hypokalemia: Secondary | ICD-10-CM | POA: Diagnosis not present

## 2018-08-11 DIAGNOSIS — D689 Coagulation defect, unspecified: Secondary | ICD-10-CM | POA: Diagnosis not present

## 2018-08-11 DIAGNOSIS — N186 End stage renal disease: Secondary | ICD-10-CM | POA: Diagnosis not present

## 2018-08-11 DIAGNOSIS — E8770 Fluid overload, unspecified: Secondary | ICD-10-CM | POA: Diagnosis not present

## 2018-08-13 DIAGNOSIS — D509 Iron deficiency anemia, unspecified: Secondary | ICD-10-CM | POA: Diagnosis not present

## 2018-08-13 DIAGNOSIS — D689 Coagulation defect, unspecified: Secondary | ICD-10-CM | POA: Diagnosis not present

## 2018-08-13 DIAGNOSIS — E876 Hypokalemia: Secondary | ICD-10-CM | POA: Diagnosis not present

## 2018-08-13 DIAGNOSIS — N186 End stage renal disease: Secondary | ICD-10-CM | POA: Diagnosis not present

## 2018-08-13 DIAGNOSIS — N2581 Secondary hyperparathyroidism of renal origin: Secondary | ICD-10-CM | POA: Diagnosis not present

## 2018-08-13 DIAGNOSIS — E8770 Fluid overload, unspecified: Secondary | ICD-10-CM | POA: Diagnosis not present

## 2018-08-15 DIAGNOSIS — I480 Paroxysmal atrial fibrillation: Secondary | ICD-10-CM | POA: Diagnosis not present

## 2018-08-15 DIAGNOSIS — W1830XD Fall on same level, unspecified, subsequent encounter: Secondary | ICD-10-CM | POA: Diagnosis not present

## 2018-08-15 DIAGNOSIS — S32511D Fracture of superior rim of right pubis, subsequent encounter for fracture with routine healing: Secondary | ICD-10-CM | POA: Diagnosis not present

## 2018-08-15 DIAGNOSIS — Z79891 Long term (current) use of opiate analgesic: Secondary | ICD-10-CM | POA: Diagnosis not present

## 2018-08-15 DIAGNOSIS — I120 Hypertensive chronic kidney disease with stage 5 chronic kidney disease or end stage renal disease: Secondary | ICD-10-CM | POA: Diagnosis not present

## 2018-08-15 DIAGNOSIS — D62 Acute posthemorrhagic anemia: Secondary | ICD-10-CM | POA: Diagnosis not present

## 2018-08-15 DIAGNOSIS — D638 Anemia in other chronic diseases classified elsewhere: Secondary | ICD-10-CM | POA: Diagnosis not present

## 2018-08-15 DIAGNOSIS — N186 End stage renal disease: Secondary | ICD-10-CM | POA: Diagnosis not present

## 2018-08-15 DIAGNOSIS — Z7901 Long term (current) use of anticoagulants: Secondary | ICD-10-CM | POA: Diagnosis not present

## 2018-08-15 DIAGNOSIS — Z992 Dependence on renal dialysis: Secondary | ICD-10-CM | POA: Diagnosis not present

## 2018-08-16 ENCOUNTER — Telehealth: Payer: Self-pay | Admitting: Cardiovascular Disease

## 2018-08-16 DIAGNOSIS — N186 End stage renal disease: Secondary | ICD-10-CM | POA: Diagnosis not present

## 2018-08-16 DIAGNOSIS — E8770 Fluid overload, unspecified: Secondary | ICD-10-CM | POA: Diagnosis not present

## 2018-08-16 DIAGNOSIS — E876 Hypokalemia: Secondary | ICD-10-CM | POA: Diagnosis not present

## 2018-08-16 DIAGNOSIS — D689 Coagulation defect, unspecified: Secondary | ICD-10-CM | POA: Diagnosis not present

## 2018-08-16 DIAGNOSIS — N2581 Secondary hyperparathyroidism of renal origin: Secondary | ICD-10-CM | POA: Diagnosis not present

## 2018-08-16 DIAGNOSIS — I120 Hypertensive chronic kidney disease with stage 5 chronic kidney disease or end stage renal disease: Secondary | ICD-10-CM | POA: Diagnosis not present

## 2018-08-16 DIAGNOSIS — Z992 Dependence on renal dialysis: Secondary | ICD-10-CM | POA: Diagnosis not present

## 2018-08-16 DIAGNOSIS — D509 Iron deficiency anemia, unspecified: Secondary | ICD-10-CM | POA: Diagnosis not present

## 2018-08-16 NOTE — Telephone Encounter (Signed)
New message:     Patient nurse calling concerning should patient start back on her coumadin or she should be off.

## 2018-08-16 NOTE — Telephone Encounter (Signed)
Returned call to [place] and advised her that warfarin was discontinued permanently on 3/18 OV note after discussion with Dr Gwenlyn Found that multiple falls and subsequent bleeding events outweigh the benefit of her remaining on anticoagulation. She verbalized understanding and stated the pt was also under this impression but she wanted to confirm with the office. No further questions.

## 2018-08-18 DIAGNOSIS — E8770 Fluid overload, unspecified: Secondary | ICD-10-CM | POA: Diagnosis not present

## 2018-08-18 DIAGNOSIS — N186 End stage renal disease: Secondary | ICD-10-CM | POA: Diagnosis not present

## 2018-08-18 DIAGNOSIS — E876 Hypokalemia: Secondary | ICD-10-CM | POA: Diagnosis not present

## 2018-08-18 DIAGNOSIS — D689 Coagulation defect, unspecified: Secondary | ICD-10-CM | POA: Diagnosis not present

## 2018-08-18 DIAGNOSIS — N2581 Secondary hyperparathyroidism of renal origin: Secondary | ICD-10-CM | POA: Diagnosis not present

## 2018-08-20 DIAGNOSIS — N186 End stage renal disease: Secondary | ICD-10-CM | POA: Diagnosis not present

## 2018-08-20 DIAGNOSIS — E8770 Fluid overload, unspecified: Secondary | ICD-10-CM | POA: Diagnosis not present

## 2018-08-20 DIAGNOSIS — E876 Hypokalemia: Secondary | ICD-10-CM | POA: Diagnosis not present

## 2018-08-20 DIAGNOSIS — D689 Coagulation defect, unspecified: Secondary | ICD-10-CM | POA: Diagnosis not present

## 2018-08-20 DIAGNOSIS — N2581 Secondary hyperparathyroidism of renal origin: Secondary | ICD-10-CM | POA: Diagnosis not present

## 2018-08-21 DIAGNOSIS — J9601 Acute respiratory failure with hypoxia: Secondary | ICD-10-CM | POA: Diagnosis not present

## 2018-08-21 DIAGNOSIS — R5381 Other malaise: Secondary | ICD-10-CM | POA: Diagnosis not present

## 2018-08-21 DIAGNOSIS — J9621 Acute and chronic respiratory failure with hypoxia: Secondary | ICD-10-CM | POA: Diagnosis not present

## 2018-08-21 DIAGNOSIS — Z789 Other specified health status: Secondary | ICD-10-CM | POA: Diagnosis not present

## 2018-08-23 DIAGNOSIS — E876 Hypokalemia: Secondary | ICD-10-CM | POA: Diagnosis not present

## 2018-08-23 DIAGNOSIS — D689 Coagulation defect, unspecified: Secondary | ICD-10-CM | POA: Diagnosis not present

## 2018-08-23 DIAGNOSIS — N2581 Secondary hyperparathyroidism of renal origin: Secondary | ICD-10-CM | POA: Diagnosis not present

## 2018-08-23 DIAGNOSIS — N186 End stage renal disease: Secondary | ICD-10-CM | POA: Diagnosis not present

## 2018-08-23 DIAGNOSIS — E8770 Fluid overload, unspecified: Secondary | ICD-10-CM | POA: Diagnosis not present

## 2018-08-25 ENCOUNTER — Ambulatory Visit: Payer: Self-pay | Admitting: Pharmacist Clinician (PhC)/ Clinical Pharmacy Specialist

## 2018-08-25 DIAGNOSIS — E8770 Fluid overload, unspecified: Secondary | ICD-10-CM | POA: Diagnosis not present

## 2018-08-25 DIAGNOSIS — D689 Coagulation defect, unspecified: Secondary | ICD-10-CM | POA: Diagnosis not present

## 2018-08-25 DIAGNOSIS — E876 Hypokalemia: Secondary | ICD-10-CM | POA: Diagnosis not present

## 2018-08-25 DIAGNOSIS — Z7901 Long term (current) use of anticoagulants: Secondary | ICD-10-CM

## 2018-08-25 DIAGNOSIS — N2581 Secondary hyperparathyroidism of renal origin: Secondary | ICD-10-CM | POA: Diagnosis not present

## 2018-08-25 DIAGNOSIS — N186 End stage renal disease: Secondary | ICD-10-CM | POA: Diagnosis not present

## 2018-08-27 DIAGNOSIS — D689 Coagulation defect, unspecified: Secondary | ICD-10-CM | POA: Diagnosis not present

## 2018-08-27 DIAGNOSIS — E8770 Fluid overload, unspecified: Secondary | ICD-10-CM | POA: Diagnosis not present

## 2018-08-27 DIAGNOSIS — N2581 Secondary hyperparathyroidism of renal origin: Secondary | ICD-10-CM | POA: Diagnosis not present

## 2018-08-27 DIAGNOSIS — E876 Hypokalemia: Secondary | ICD-10-CM | POA: Diagnosis not present

## 2018-08-27 DIAGNOSIS — N186 End stage renal disease: Secondary | ICD-10-CM | POA: Diagnosis not present

## 2018-08-29 ENCOUNTER — Other Ambulatory Visit: Payer: Self-pay | Admitting: Family Medicine

## 2018-08-30 DIAGNOSIS — N2581 Secondary hyperparathyroidism of renal origin: Secondary | ICD-10-CM | POA: Diagnosis not present

## 2018-08-30 DIAGNOSIS — D689 Coagulation defect, unspecified: Secondary | ICD-10-CM | POA: Diagnosis not present

## 2018-08-30 DIAGNOSIS — N186 End stage renal disease: Secondary | ICD-10-CM | POA: Diagnosis not present

## 2018-08-30 DIAGNOSIS — E8770 Fluid overload, unspecified: Secondary | ICD-10-CM | POA: Diagnosis not present

## 2018-08-30 DIAGNOSIS — E876 Hypokalemia: Secondary | ICD-10-CM | POA: Diagnosis not present

## 2018-08-31 DIAGNOSIS — N2581 Secondary hyperparathyroidism of renal origin: Secondary | ICD-10-CM | POA: Diagnosis not present

## 2018-08-31 DIAGNOSIS — E8770 Fluid overload, unspecified: Secondary | ICD-10-CM | POA: Diagnosis not present

## 2018-08-31 DIAGNOSIS — D689 Coagulation defect, unspecified: Secondary | ICD-10-CM | POA: Diagnosis not present

## 2018-08-31 DIAGNOSIS — N186 End stage renal disease: Secondary | ICD-10-CM | POA: Diagnosis not present

## 2018-08-31 DIAGNOSIS — E876 Hypokalemia: Secondary | ICD-10-CM | POA: Diagnosis not present

## 2018-09-01 DIAGNOSIS — D689 Coagulation defect, unspecified: Secondary | ICD-10-CM | POA: Diagnosis not present

## 2018-09-01 DIAGNOSIS — E876 Hypokalemia: Secondary | ICD-10-CM | POA: Diagnosis not present

## 2018-09-01 DIAGNOSIS — E8770 Fluid overload, unspecified: Secondary | ICD-10-CM | POA: Diagnosis not present

## 2018-09-01 DIAGNOSIS — N186 End stage renal disease: Secondary | ICD-10-CM | POA: Diagnosis not present

## 2018-09-01 DIAGNOSIS — N2581 Secondary hyperparathyroidism of renal origin: Secondary | ICD-10-CM | POA: Diagnosis not present

## 2018-09-03 DIAGNOSIS — N186 End stage renal disease: Secondary | ICD-10-CM | POA: Diagnosis not present

## 2018-09-03 DIAGNOSIS — E876 Hypokalemia: Secondary | ICD-10-CM | POA: Diagnosis not present

## 2018-09-03 DIAGNOSIS — D689 Coagulation defect, unspecified: Secondary | ICD-10-CM | POA: Diagnosis not present

## 2018-09-03 DIAGNOSIS — E8770 Fluid overload, unspecified: Secondary | ICD-10-CM | POA: Diagnosis not present

## 2018-09-03 DIAGNOSIS — N2581 Secondary hyperparathyroidism of renal origin: Secondary | ICD-10-CM | POA: Diagnosis not present

## 2018-09-06 DIAGNOSIS — N2581 Secondary hyperparathyroidism of renal origin: Secondary | ICD-10-CM | POA: Diagnosis not present

## 2018-09-06 DIAGNOSIS — E8770 Fluid overload, unspecified: Secondary | ICD-10-CM | POA: Diagnosis not present

## 2018-09-06 DIAGNOSIS — D689 Coagulation defect, unspecified: Secondary | ICD-10-CM | POA: Diagnosis not present

## 2018-09-06 DIAGNOSIS — N186 End stage renal disease: Secondary | ICD-10-CM | POA: Diagnosis not present

## 2018-09-06 DIAGNOSIS — E876 Hypokalemia: Secondary | ICD-10-CM | POA: Diagnosis not present

## 2018-09-07 ENCOUNTER — Other Ambulatory Visit: Payer: Self-pay

## 2018-09-07 ENCOUNTER — Ambulatory Visit (INDEPENDENT_AMBULATORY_CARE_PROVIDER_SITE_OTHER): Payer: Medicare Other | Admitting: *Deleted

## 2018-09-07 DIAGNOSIS — I495 Sick sinus syndrome: Secondary | ICD-10-CM | POA: Diagnosis not present

## 2018-09-07 LAB — CUP PACEART REMOTE DEVICE CHECK
Battery Remaining Longevity: 125 mo
Battery Remaining Percentage: 95.5 %
Battery Voltage: 3.01 V
Brady Statistic AP VP Percent: 1 %
Brady Statistic AP VS Percent: 6.9 %
Brady Statistic AS VP Percent: 1 %
Brady Statistic AS VS Percent: 93 %
Brady Statistic RA Percent Paced: 6.7 %
Brady Statistic RV Percent Paced: 1 %
Date Time Interrogation Session: 20200422095434
Implantable Lead Implant Date: 20180719
Implantable Lead Implant Date: 20180719
Implantable Lead Location: 753859
Implantable Lead Location: 753860
Implantable Pulse Generator Implant Date: 20180719
Lead Channel Impedance Value: 430 Ohm
Lead Channel Impedance Value: 460 Ohm
Lead Channel Pacing Threshold Amplitude: 0.75 V
Lead Channel Pacing Threshold Amplitude: 0.75 V
Lead Channel Pacing Threshold Pulse Width: 0.5 ms
Lead Channel Pacing Threshold Pulse Width: 0.5 ms
Lead Channel Sensing Intrinsic Amplitude: 10.6 mV
Lead Channel Sensing Intrinsic Amplitude: 4.6 mV
Lead Channel Setting Pacing Amplitude: 2 V
Lead Channel Setting Pacing Amplitude: 2.5 V
Lead Channel Setting Pacing Pulse Width: 0.5 ms
Lead Channel Setting Sensing Sensitivity: 2 mV
Pulse Gen Model: 2272
Pulse Gen Serial Number: 8918511

## 2018-09-08 DIAGNOSIS — E8770 Fluid overload, unspecified: Secondary | ICD-10-CM | POA: Diagnosis not present

## 2018-09-08 DIAGNOSIS — E876 Hypokalemia: Secondary | ICD-10-CM | POA: Diagnosis not present

## 2018-09-08 DIAGNOSIS — N186 End stage renal disease: Secondary | ICD-10-CM | POA: Diagnosis not present

## 2018-09-08 DIAGNOSIS — D689 Coagulation defect, unspecified: Secondary | ICD-10-CM | POA: Diagnosis not present

## 2018-09-08 DIAGNOSIS — N2581 Secondary hyperparathyroidism of renal origin: Secondary | ICD-10-CM | POA: Diagnosis not present

## 2018-09-10 DIAGNOSIS — E8770 Fluid overload, unspecified: Secondary | ICD-10-CM | POA: Diagnosis not present

## 2018-09-10 DIAGNOSIS — D689 Coagulation defect, unspecified: Secondary | ICD-10-CM | POA: Diagnosis not present

## 2018-09-10 DIAGNOSIS — N186 End stage renal disease: Secondary | ICD-10-CM | POA: Diagnosis not present

## 2018-09-10 DIAGNOSIS — N2581 Secondary hyperparathyroidism of renal origin: Secondary | ICD-10-CM | POA: Diagnosis not present

## 2018-09-10 DIAGNOSIS — E876 Hypokalemia: Secondary | ICD-10-CM | POA: Diagnosis not present

## 2018-09-13 DIAGNOSIS — N186 End stage renal disease: Secondary | ICD-10-CM | POA: Diagnosis not present

## 2018-09-13 DIAGNOSIS — E8770 Fluid overload, unspecified: Secondary | ICD-10-CM | POA: Diagnosis not present

## 2018-09-13 DIAGNOSIS — E876 Hypokalemia: Secondary | ICD-10-CM | POA: Diagnosis not present

## 2018-09-13 DIAGNOSIS — N2581 Secondary hyperparathyroidism of renal origin: Secondary | ICD-10-CM | POA: Diagnosis not present

## 2018-09-13 DIAGNOSIS — D689 Coagulation defect, unspecified: Secondary | ICD-10-CM | POA: Diagnosis not present

## 2018-09-15 ENCOUNTER — Encounter: Payer: Self-pay | Admitting: Cardiology

## 2018-09-15 DIAGNOSIS — N2581 Secondary hyperparathyroidism of renal origin: Secondary | ICD-10-CM | POA: Diagnosis not present

## 2018-09-15 DIAGNOSIS — E876 Hypokalemia: Secondary | ICD-10-CM | POA: Diagnosis not present

## 2018-09-15 DIAGNOSIS — I120 Hypertensive chronic kidney disease with stage 5 chronic kidney disease or end stage renal disease: Secondary | ICD-10-CM | POA: Diagnosis not present

## 2018-09-15 DIAGNOSIS — E8770 Fluid overload, unspecified: Secondary | ICD-10-CM | POA: Diagnosis not present

## 2018-09-15 DIAGNOSIS — Z992 Dependence on renal dialysis: Secondary | ICD-10-CM | POA: Diagnosis not present

## 2018-09-15 DIAGNOSIS — D689 Coagulation defect, unspecified: Secondary | ICD-10-CM | POA: Diagnosis not present

## 2018-09-15 DIAGNOSIS — N186 End stage renal disease: Secondary | ICD-10-CM | POA: Diagnosis not present

## 2018-09-15 NOTE — Progress Notes (Signed)
Remote pacemaker transmission.   

## 2018-09-16 IMAGING — DX DG ABDOMEN 1V
3 series · 3 of 3 positions shown · non-contrast
Comparison: 08/05/2016 .

CLINICAL DATA: Severe right-sided abdominal pain and tenderness.

EXAM:
ABDOMEN - 1 VIEW

[abdomen kub (1 of 3)]
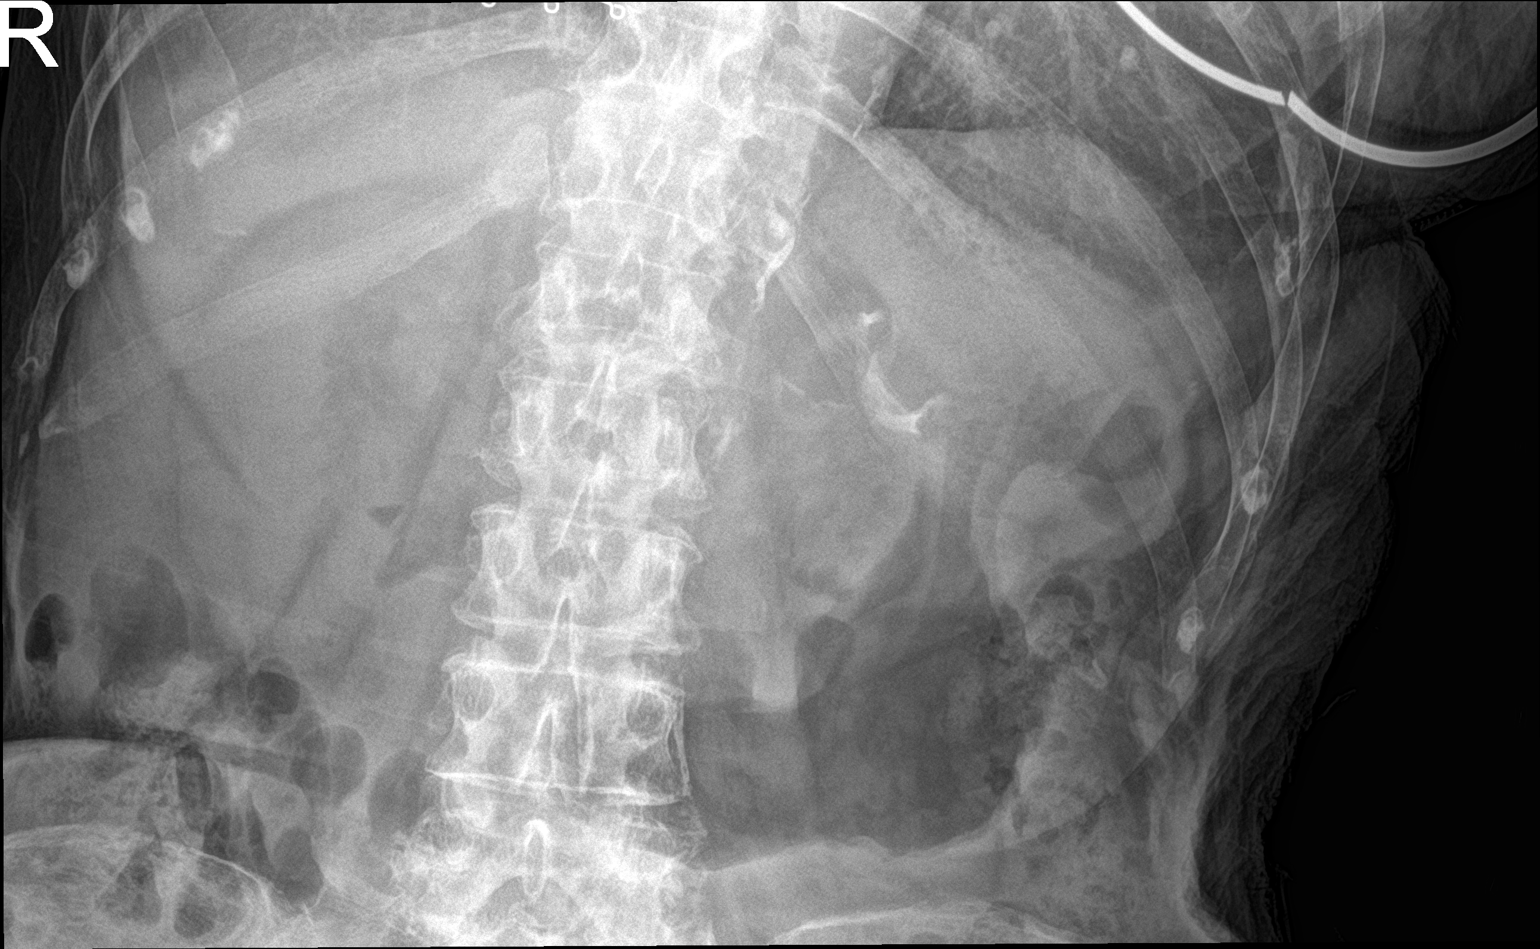

[abdomen kub (2 of 3)]
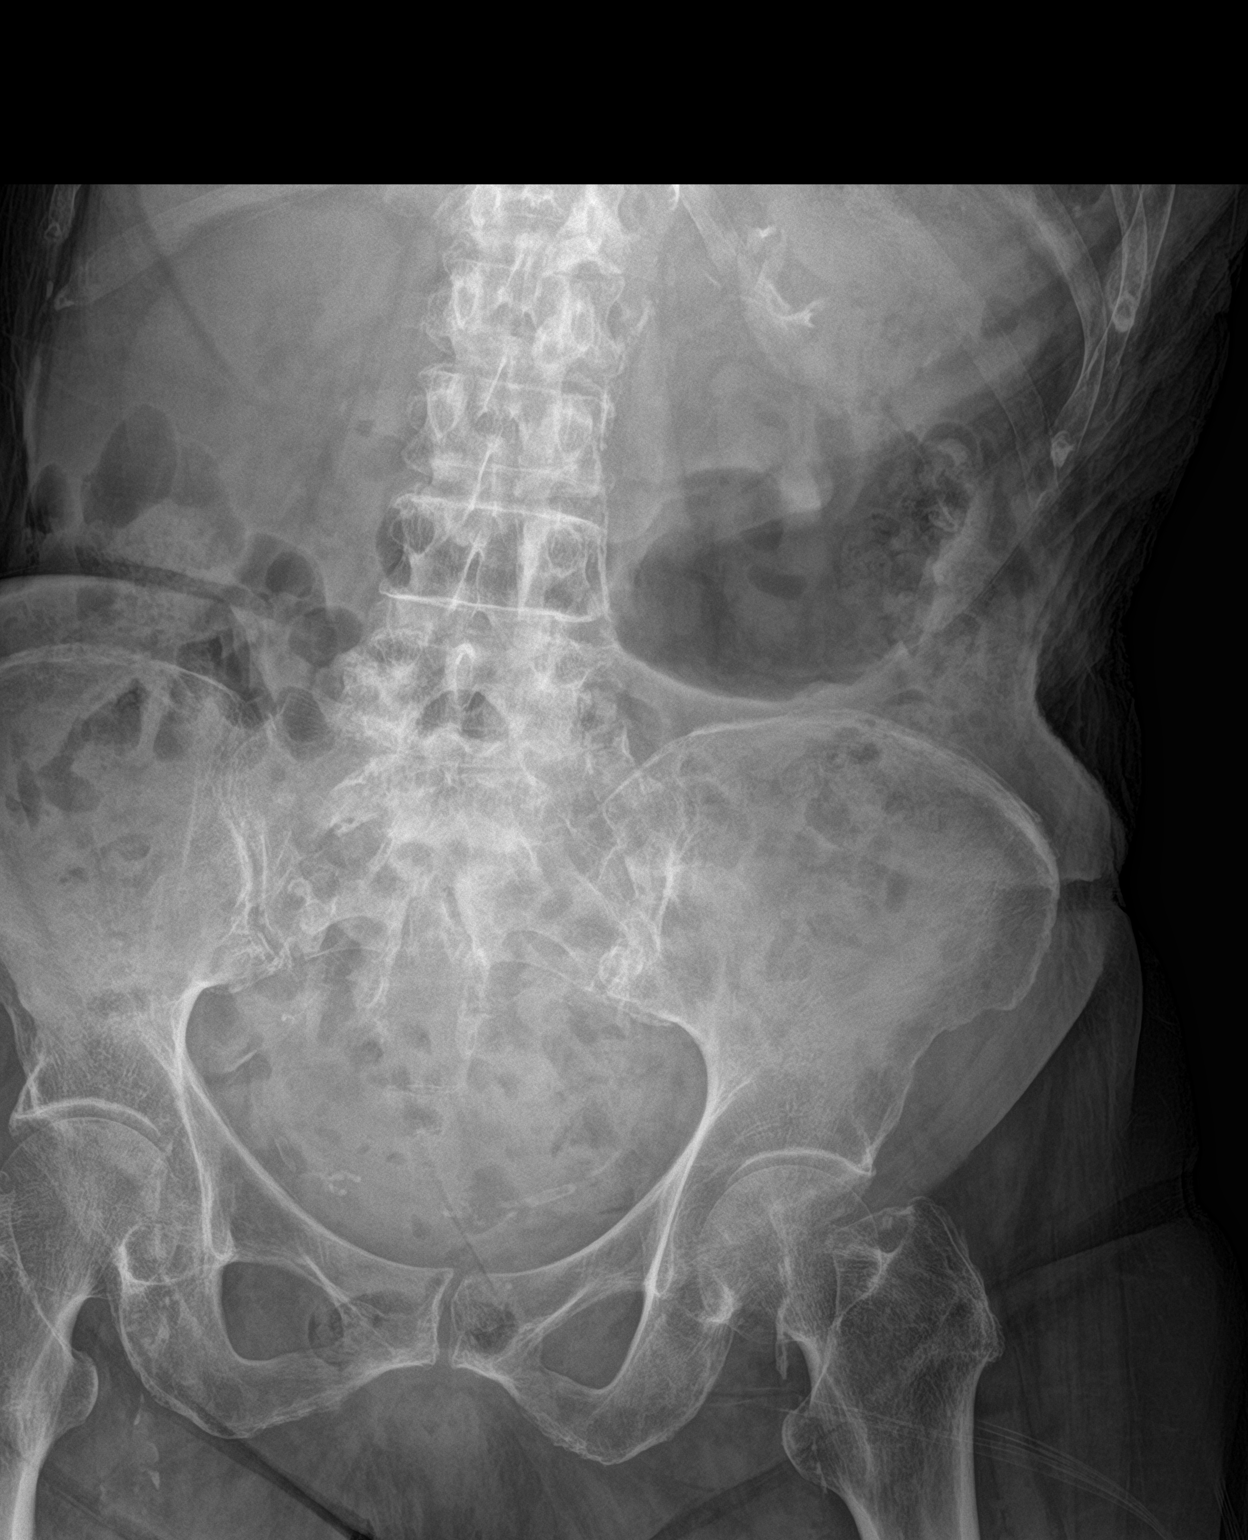

[abdomen kub (3 of 3)]
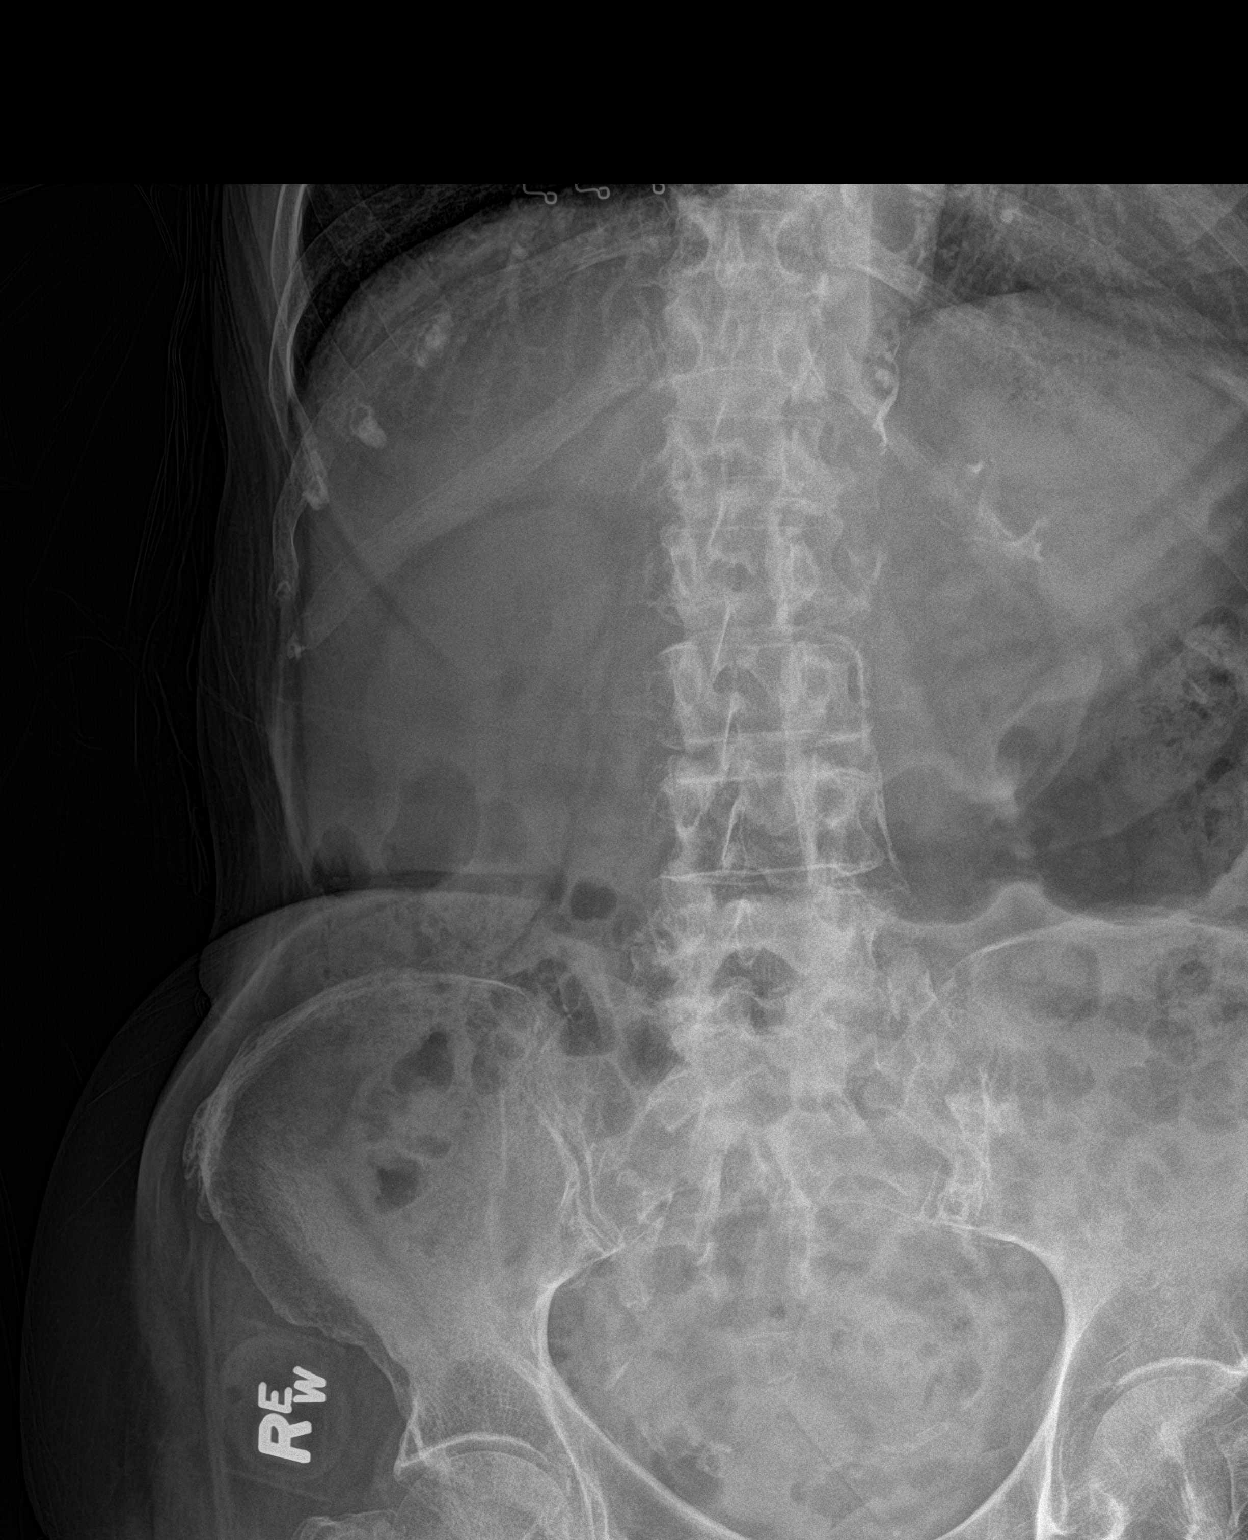

[3 of 3 positions shown; findings below may reference images not displayed]

FINDINGS: Soft tissue structures are unremarkable. No bowel distention. No
free air. No evidence of nephrolithiasis. Aortic and visceral
atherosclerotic vascular calcification again noted. No acute bony
abnormality.
IMPRESSION: No acute abnormality.

## 2018-09-17 DIAGNOSIS — R52 Pain, unspecified: Secondary | ICD-10-CM | POA: Diagnosis not present

## 2018-09-17 DIAGNOSIS — N186 End stage renal disease: Secondary | ICD-10-CM | POA: Diagnosis not present

## 2018-09-17 DIAGNOSIS — N2581 Secondary hyperparathyroidism of renal origin: Secondary | ICD-10-CM | POA: Diagnosis not present

## 2018-09-17 DIAGNOSIS — D631 Anemia in chronic kidney disease: Secondary | ICD-10-CM | POA: Diagnosis not present

## 2018-09-17 DIAGNOSIS — D689 Coagulation defect, unspecified: Secondary | ICD-10-CM | POA: Diagnosis not present

## 2018-09-17 DIAGNOSIS — E876 Hypokalemia: Secondary | ICD-10-CM | POA: Diagnosis not present

## 2018-09-20 DIAGNOSIS — R52 Pain, unspecified: Secondary | ICD-10-CM | POA: Diagnosis not present

## 2018-09-20 DIAGNOSIS — R5381 Other malaise: Secondary | ICD-10-CM | POA: Diagnosis not present

## 2018-09-20 DIAGNOSIS — E876 Hypokalemia: Secondary | ICD-10-CM | POA: Diagnosis not present

## 2018-09-20 DIAGNOSIS — J9601 Acute respiratory failure with hypoxia: Secondary | ICD-10-CM | POA: Diagnosis not present

## 2018-09-20 DIAGNOSIS — N2581 Secondary hyperparathyroidism of renal origin: Secondary | ICD-10-CM | POA: Diagnosis not present

## 2018-09-20 DIAGNOSIS — D689 Coagulation defect, unspecified: Secondary | ICD-10-CM | POA: Diagnosis not present

## 2018-09-20 DIAGNOSIS — J9621 Acute and chronic respiratory failure with hypoxia: Secondary | ICD-10-CM | POA: Diagnosis not present

## 2018-09-20 DIAGNOSIS — D631 Anemia in chronic kidney disease: Secondary | ICD-10-CM | POA: Diagnosis not present

## 2018-09-20 DIAGNOSIS — Z789 Other specified health status: Secondary | ICD-10-CM | POA: Diagnosis not present

## 2018-09-20 DIAGNOSIS — N186 End stage renal disease: Secondary | ICD-10-CM | POA: Diagnosis not present

## 2018-09-21 DIAGNOSIS — T82858A Stenosis of vascular prosthetic devices, implants and grafts, initial encounter: Secondary | ICD-10-CM | POA: Diagnosis not present

## 2018-09-21 DIAGNOSIS — N186 End stage renal disease: Secondary | ICD-10-CM | POA: Diagnosis not present

## 2018-09-21 DIAGNOSIS — I871 Compression of vein: Secondary | ICD-10-CM | POA: Diagnosis not present

## 2018-09-21 DIAGNOSIS — Z992 Dependence on renal dialysis: Secondary | ICD-10-CM | POA: Diagnosis not present

## 2018-09-22 DIAGNOSIS — D631 Anemia in chronic kidney disease: Secondary | ICD-10-CM | POA: Diagnosis not present

## 2018-09-22 DIAGNOSIS — D689 Coagulation defect, unspecified: Secondary | ICD-10-CM | POA: Diagnosis not present

## 2018-09-22 DIAGNOSIS — E876 Hypokalemia: Secondary | ICD-10-CM | POA: Diagnosis not present

## 2018-09-22 DIAGNOSIS — N186 End stage renal disease: Secondary | ICD-10-CM | POA: Diagnosis not present

## 2018-09-22 DIAGNOSIS — R52 Pain, unspecified: Secondary | ICD-10-CM | POA: Diagnosis not present

## 2018-09-22 DIAGNOSIS — N2581 Secondary hyperparathyroidism of renal origin: Secondary | ICD-10-CM | POA: Diagnosis not present

## 2018-09-24 DIAGNOSIS — D631 Anemia in chronic kidney disease: Secondary | ICD-10-CM | POA: Diagnosis not present

## 2018-09-24 DIAGNOSIS — E876 Hypokalemia: Secondary | ICD-10-CM | POA: Diagnosis not present

## 2018-09-24 DIAGNOSIS — N186 End stage renal disease: Secondary | ICD-10-CM | POA: Diagnosis not present

## 2018-09-24 DIAGNOSIS — D689 Coagulation defect, unspecified: Secondary | ICD-10-CM | POA: Diagnosis not present

## 2018-09-24 DIAGNOSIS — N2581 Secondary hyperparathyroidism of renal origin: Secondary | ICD-10-CM | POA: Diagnosis not present

## 2018-09-24 DIAGNOSIS — R52 Pain, unspecified: Secondary | ICD-10-CM | POA: Diagnosis not present

## 2018-09-27 DIAGNOSIS — D631 Anemia in chronic kidney disease: Secondary | ICD-10-CM | POA: Diagnosis not present

## 2018-09-27 DIAGNOSIS — E876 Hypokalemia: Secondary | ICD-10-CM | POA: Diagnosis not present

## 2018-09-27 DIAGNOSIS — D689 Coagulation defect, unspecified: Secondary | ICD-10-CM | POA: Diagnosis not present

## 2018-09-27 DIAGNOSIS — N186 End stage renal disease: Secondary | ICD-10-CM | POA: Diagnosis not present

## 2018-09-27 DIAGNOSIS — R52 Pain, unspecified: Secondary | ICD-10-CM | POA: Diagnosis not present

## 2018-09-27 DIAGNOSIS — N2581 Secondary hyperparathyroidism of renal origin: Secondary | ICD-10-CM | POA: Diagnosis not present

## 2018-09-29 DIAGNOSIS — N186 End stage renal disease: Secondary | ICD-10-CM | POA: Diagnosis not present

## 2018-09-29 DIAGNOSIS — E876 Hypokalemia: Secondary | ICD-10-CM | POA: Diagnosis not present

## 2018-09-29 DIAGNOSIS — D631 Anemia in chronic kidney disease: Secondary | ICD-10-CM | POA: Diagnosis not present

## 2018-09-29 DIAGNOSIS — N2581 Secondary hyperparathyroidism of renal origin: Secondary | ICD-10-CM | POA: Diagnosis not present

## 2018-09-29 DIAGNOSIS — D689 Coagulation defect, unspecified: Secondary | ICD-10-CM | POA: Diagnosis not present

## 2018-09-29 DIAGNOSIS — R52 Pain, unspecified: Secondary | ICD-10-CM | POA: Diagnosis not present

## 2018-10-01 DIAGNOSIS — R52 Pain, unspecified: Secondary | ICD-10-CM | POA: Diagnosis not present

## 2018-10-01 DIAGNOSIS — N186 End stage renal disease: Secondary | ICD-10-CM | POA: Diagnosis not present

## 2018-10-01 DIAGNOSIS — E876 Hypokalemia: Secondary | ICD-10-CM | POA: Diagnosis not present

## 2018-10-01 DIAGNOSIS — D689 Coagulation defect, unspecified: Secondary | ICD-10-CM | POA: Diagnosis not present

## 2018-10-01 DIAGNOSIS — D631 Anemia in chronic kidney disease: Secondary | ICD-10-CM | POA: Diagnosis not present

## 2018-10-01 DIAGNOSIS — N2581 Secondary hyperparathyroidism of renal origin: Secondary | ICD-10-CM | POA: Diagnosis not present

## 2018-10-04 ENCOUNTER — Emergency Department (HOSPITAL_COMMUNITY): Payer: Medicare Other

## 2018-10-04 ENCOUNTER — Inpatient Hospital Stay (HOSPITAL_COMMUNITY): Payer: Medicare Other

## 2018-10-04 ENCOUNTER — Encounter (HOSPITAL_COMMUNITY): Payer: Self-pay | Admitting: Emergency Medicine

## 2018-10-04 ENCOUNTER — Inpatient Hospital Stay (HOSPITAL_COMMUNITY)
Admission: EM | Admit: 2018-10-04 | Discharge: 2018-10-11 | DRG: 480 | Disposition: A | Discharge status: Skilled Nursing Facility | Payer: Medicare Other | Attending: Internal Medicine | Admitting: Internal Medicine

## 2018-10-04 ENCOUNTER — Inpatient Hospital Stay (HOSPITAL_COMMUNITY): Payer: Medicare Other | Admitting: Certified Registered"

## 2018-10-04 ENCOUNTER — Encounter (HOSPITAL_COMMUNITY)
Admission: EM | Disposition: A | Discharge status: Skilled Nursing Facility | Payer: Self-pay | Source: Home / Self Care | Attending: Internal Medicine

## 2018-10-04 DIAGNOSIS — S72141A Displaced intertrochanteric fracture of right femur, initial encounter for closed fracture: Principal | ICD-10-CM | POA: Diagnosis present

## 2018-10-04 DIAGNOSIS — I495 Sick sinus syndrome: Secondary | ICD-10-CM | POA: Diagnosis not present

## 2018-10-04 DIAGNOSIS — I059 Rheumatic mitral valve disease, unspecified: Secondary | ICD-10-CM | POA: Diagnosis present

## 2018-10-04 DIAGNOSIS — I5030 Unspecified diastolic (congestive) heart failure: Secondary | ICD-10-CM | POA: Diagnosis not present

## 2018-10-04 DIAGNOSIS — R001 Bradycardia, unspecified: Secondary | ICD-10-CM | POA: Diagnosis not present

## 2018-10-04 DIAGNOSIS — Z809 Family history of malignant neoplasm, unspecified: Secondary | ICD-10-CM | POA: Diagnosis not present

## 2018-10-04 DIAGNOSIS — M25561 Pain in right knee: Secondary | ICD-10-CM

## 2018-10-04 DIAGNOSIS — I10 Essential (primary) hypertension: Secondary | ICD-10-CM | POA: Diagnosis not present

## 2018-10-04 DIAGNOSIS — M48061 Spinal stenosis, lumbar region without neurogenic claudication: Secondary | ICD-10-CM | POA: Diagnosis present

## 2018-10-04 DIAGNOSIS — Y92019 Unspecified place in single-family (private) house as the place of occurrence of the external cause: Secondary | ICD-10-CM | POA: Diagnosis not present

## 2018-10-04 DIAGNOSIS — N2581 Secondary hyperparathyroidism of renal origin: Secondary | ICD-10-CM | POA: Diagnosis present

## 2018-10-04 DIAGNOSIS — S72009A Fracture of unspecified part of neck of unspecified femur, initial encounter for closed fracture: Secondary | ICD-10-CM

## 2018-10-04 DIAGNOSIS — I480 Paroxysmal atrial fibrillation: Secondary | ICD-10-CM | POA: Diagnosis present

## 2018-10-04 DIAGNOSIS — D631 Anemia in chronic kidney disease: Secondary | ICD-10-CM | POA: Diagnosis present

## 2018-10-04 DIAGNOSIS — M4726 Other spondylosis with radiculopathy, lumbar region: Secondary | ICD-10-CM | POA: Diagnosis not present

## 2018-10-04 DIAGNOSIS — W109XXA Fall (on) (from) unspecified stairs and steps, initial encounter: Secondary | ICD-10-CM | POA: Diagnosis present

## 2018-10-04 DIAGNOSIS — R52 Pain, unspecified: Secondary | ICD-10-CM | POA: Diagnosis not present

## 2018-10-04 DIAGNOSIS — Z91048 Other nonmedicinal substance allergy status: Secondary | ICD-10-CM

## 2018-10-04 DIAGNOSIS — I959 Hypotension, unspecified: Secondary | ICD-10-CM | POA: Diagnosis not present

## 2018-10-04 DIAGNOSIS — W19XXXA Unspecified fall, initial encounter: Secondary | ICD-10-CM | POA: Diagnosis not present

## 2018-10-04 DIAGNOSIS — N186 End stage renal disease: Secondary | ICD-10-CM | POA: Diagnosis not present

## 2018-10-04 DIAGNOSIS — I5042 Chronic combined systolic (congestive) and diastolic (congestive) heart failure: Secondary | ICD-10-CM | POA: Diagnosis not present

## 2018-10-04 DIAGNOSIS — Z8673 Personal history of transient ischemic attack (TIA), and cerebral infarction without residual deficits: Secondary | ICD-10-CM

## 2018-10-04 DIAGNOSIS — M2550 Pain in unspecified joint: Secondary | ICD-10-CM | POA: Diagnosis not present

## 2018-10-04 DIAGNOSIS — Z888 Allergy status to other drugs, medicaments and biological substances status: Secondary | ICD-10-CM

## 2018-10-04 DIAGNOSIS — Z992 Dependence on renal dialysis: Secondary | ICD-10-CM | POA: Diagnosis not present

## 2018-10-04 DIAGNOSIS — I509 Heart failure, unspecified: Secondary | ICD-10-CM | POA: Diagnosis not present

## 2018-10-04 DIAGNOSIS — I953 Hypotension of hemodialysis: Secondary | ICD-10-CM | POA: Diagnosis not present

## 2018-10-04 DIAGNOSIS — S72001A Fracture of unspecified part of neck of right femur, initial encounter for closed fracture: Secondary | ICD-10-CM | POA: Diagnosis not present

## 2018-10-04 DIAGNOSIS — Z7401 Bed confinement status: Secondary | ICD-10-CM | POA: Diagnosis not present

## 2018-10-04 DIAGNOSIS — J9611 Chronic respiratory failure with hypoxia: Secondary | ICD-10-CM | POA: Diagnosis present

## 2018-10-04 DIAGNOSIS — D62 Acute posthemorrhagic anemia: Secondary | ICD-10-CM | POA: Diagnosis not present

## 2018-10-04 DIAGNOSIS — I110 Hypertensive heart disease with heart failure: Secondary | ICD-10-CM | POA: Diagnosis not present

## 2018-10-04 DIAGNOSIS — Z87891 Personal history of nicotine dependence: Secondary | ICD-10-CM

## 2018-10-04 DIAGNOSIS — S0990XA Unspecified injury of head, initial encounter: Secondary | ICD-10-CM | POA: Diagnosis not present

## 2018-10-04 DIAGNOSIS — S72041A Displaced fracture of base of neck of right femur, initial encounter for closed fracture: Secondary | ICD-10-CM | POA: Diagnosis not present

## 2018-10-04 DIAGNOSIS — S72141D Displaced intertrochanteric fracture of right femur, subsequent encounter for closed fracture with routine healing: Secondary | ICD-10-CM | POA: Diagnosis not present

## 2018-10-04 DIAGNOSIS — R0902 Hypoxemia: Secondary | ICD-10-CM

## 2018-10-04 DIAGNOSIS — G894 Chronic pain syndrome: Secondary | ICD-10-CM | POA: Diagnosis not present

## 2018-10-04 DIAGNOSIS — S72141S Displaced intertrochanteric fracture of right femur, sequela: Secondary | ICD-10-CM | POA: Diagnosis not present

## 2018-10-04 DIAGNOSIS — Z9981 Dependence on supplemental oxygen: Secondary | ICD-10-CM

## 2018-10-04 DIAGNOSIS — M25551 Pain in right hip: Secondary | ICD-10-CM | POA: Diagnosis present

## 2018-10-04 DIAGNOSIS — R5381 Other malaise: Secondary | ICD-10-CM | POA: Diagnosis not present

## 2018-10-04 DIAGNOSIS — I132 Hypertensive heart and chronic kidney disease with heart failure and with stage 5 chronic kidney disease, or end stage renal disease: Secondary | ICD-10-CM | POA: Diagnosis not present

## 2018-10-04 DIAGNOSIS — K5900 Constipation, unspecified: Secondary | ICD-10-CM | POA: Diagnosis not present

## 2018-10-04 DIAGNOSIS — Z882 Allergy status to sulfonamides status: Secondary | ICD-10-CM

## 2018-10-04 DIAGNOSIS — Z950 Presence of cardiac pacemaker: Secondary | ICD-10-CM | POA: Diagnosis not present

## 2018-10-04 DIAGNOSIS — D696 Thrombocytopenia, unspecified: Secondary | ICD-10-CM | POA: Diagnosis not present

## 2018-10-04 DIAGNOSIS — S299XXA Unspecified injury of thorax, initial encounter: Secondary | ICD-10-CM | POA: Diagnosis not present

## 2018-10-04 DIAGNOSIS — Z20828 Contact with and (suspected) exposure to other viral communicable diseases: Secondary | ICD-10-CM | POA: Diagnosis present

## 2018-10-04 DIAGNOSIS — S8991XA Unspecified injury of right lower leg, initial encounter: Secondary | ICD-10-CM | POA: Diagnosis not present

## 2018-10-04 DIAGNOSIS — H9190 Unspecified hearing loss, unspecified ear: Secondary | ICD-10-CM | POA: Diagnosis present

## 2018-10-04 DIAGNOSIS — S199XXA Unspecified injury of neck, initial encounter: Secondary | ICD-10-CM | POA: Diagnosis not present

## 2018-10-04 DIAGNOSIS — R2689 Other abnormalities of gait and mobility: Secondary | ICD-10-CM | POA: Diagnosis not present

## 2018-10-04 DIAGNOSIS — G92 Toxic encephalopathy: Secondary | ICD-10-CM | POA: Diagnosis not present

## 2018-10-04 DIAGNOSIS — E785 Hyperlipidemia, unspecified: Secondary | ICD-10-CM | POA: Diagnosis present

## 2018-10-04 DIAGNOSIS — I428 Other cardiomyopathies: Secondary | ICD-10-CM | POA: Diagnosis not present

## 2018-10-04 DIAGNOSIS — Z885 Allergy status to narcotic agent status: Secondary | ICD-10-CM

## 2018-10-04 DIAGNOSIS — R079 Chest pain, unspecified: Secondary | ICD-10-CM | POA: Diagnosis not present

## 2018-10-04 DIAGNOSIS — M8580 Other specified disorders of bone density and structure, unspecified site: Secondary | ICD-10-CM | POA: Diagnosis present

## 2018-10-04 DIAGNOSIS — Z419 Encounter for procedure for purposes other than remedying health state, unspecified: Secondary | ICD-10-CM

## 2018-10-04 DIAGNOSIS — G9341 Metabolic encephalopathy: Secondary | ICD-10-CM | POA: Diagnosis not present

## 2018-10-04 DIAGNOSIS — Z8249 Family history of ischemic heart disease and other diseases of the circulatory system: Secondary | ICD-10-CM

## 2018-10-04 DIAGNOSIS — R278 Other lack of coordination: Secondary | ICD-10-CM | POA: Diagnosis not present

## 2018-10-04 DIAGNOSIS — I4891 Unspecified atrial fibrillation: Secondary | ICD-10-CM | POA: Diagnosis not present

## 2018-10-04 DIAGNOSIS — Z03818 Encounter for observation for suspected exposure to other biological agents ruled out: Secondary | ICD-10-CM | POA: Diagnosis not present

## 2018-10-04 HISTORY — PX: INTRAMEDULLARY (IM) NAIL INTERTROCHANTERIC: SHX5875

## 2018-10-04 HISTORY — DX: Fracture of unspecified part of neck of right femur, initial encounter for closed fracture: S72.001A

## 2018-10-04 LAB — PROTIME-INR
INR: 1 (ref 0.8–1.2)
Prothrombin Time: 12.7 seconds (ref 11.4–15.2)

## 2018-10-04 LAB — BASIC METABOLIC PANEL
Anion gap: 16 — ABNORMAL HIGH (ref 5–15)
BUN: 23 mg/dL (ref 8–23)
CO2: 27 mmol/L (ref 22–32)
Calcium: 8.4 mg/dL — ABNORMAL LOW (ref 8.9–10.3)
Chloride: 99 mmol/L (ref 98–111)
Creatinine, Ser: 6.55 mg/dL — ABNORMAL HIGH (ref 0.44–1.00)
GFR calc Af Amer: 6 mL/min — ABNORMAL LOW (ref >60)
GFR calc non Af Amer: 5 mL/min — ABNORMAL LOW (ref >60)
Glucose, Bld: 115 mg/dL — ABNORMAL HIGH (ref 70–99)
Potassium: 3.8 mmol/L (ref 3.5–5.1)
Sodium: 142 mmol/L (ref 135–145)

## 2018-10-04 LAB — CBC WITH DIFFERENTIAL/PLATELET
Abs Immature Granulocytes: 0.06 10*3/uL (ref 0.00–0.07)
Basophils Absolute: 0 10*3/uL (ref 0.0–0.1)
Basophils Relative: 0 %
Eosinophils Absolute: 0.1 10*3/uL (ref 0.0–0.5)
Eosinophils Relative: 1 %
HCT: 34.6 % — ABNORMAL LOW (ref 36.0–46.0)
Hemoglobin: 10.6 g/dL — ABNORMAL LOW (ref 12.0–15.0)
Immature Granulocytes: 1 %
Lymphocytes Relative: 8 %
Lymphs Abs: 0.7 10*3/uL (ref 0.7–4.0)
MCH: 32.3 pg (ref 26.0–34.0)
MCHC: 30.6 g/dL (ref 30.0–36.0)
MCV: 105.5 fL — ABNORMAL HIGH (ref 80.0–100.0)
Monocytes Absolute: 1.1 10*3/uL — ABNORMAL HIGH (ref 0.1–1.0)
Monocytes Relative: 13 %
Neutro Abs: 6.7 10*3/uL (ref 1.7–7.7)
Neutrophils Relative %: 77 %
Platelets: 171 10*3/uL (ref 150–400)
RBC: 3.28 MIL/uL — ABNORMAL LOW (ref 3.87–5.11)
RDW: 14.8 % (ref 11.5–15.5)
WBC: 8.6 10*3/uL (ref 4.0–10.5)
nRBC: 0 % (ref 0.0–0.2)

## 2018-10-04 LAB — SARS CORONAVIRUS 2 BY RT PCR (HOSPITAL ORDER, PERFORMED IN ~~LOC~~ HOSPITAL LAB): SARS Coronavirus 2: NEGATIVE

## 2018-10-04 SURGERY — FIXATION, FRACTURE, INTERTROCHANTERIC, WITH INTRAMEDULLARY ROD
Anesthesia: General | Site: Hip | Laterality: Right

## 2018-10-04 MED ORDER — ACETAMINOPHEN 500 MG PO TABS
1000.0000 mg | ORAL_TABLET | Freq: Four times a day (QID) | ORAL | Status: AC
  Administered 2018-10-04 – 2018-10-05 (×2): 1000 mg via ORAL
  Filled 2018-10-04 (×3): qty 2

## 2018-10-04 MED ORDER — HYDROCODONE-ACETAMINOPHEN 5-325 MG PO TABS
1.0000 | ORAL_TABLET | Freq: Four times a day (QID) | ORAL | Status: DC | PRN
  Administered 2018-10-04: 2 via ORAL

## 2018-10-04 MED ORDER — FENTANYL CITRATE (PF) 100 MCG/2ML IJ SOLN: 12.5000 ug | INTRAMUSCULAR | Status: DC | PRN

## 2018-10-04 MED ORDER — CEFAZOLIN SODIUM-DEXTROSE 2-4 GM/100ML-% IV SOLN
INTRAVENOUS | Status: AC
  Filled 2018-10-04: qty 100

## 2018-10-04 MED ORDER — METOCLOPRAMIDE HCL 5 MG PO TABS: 5.0000 mg | ORAL_TABLET | Freq: Three times a day (TID) | ORAL | Status: DC | PRN

## 2018-10-04 MED ORDER — SUCCINYLCHOLINE CHLORIDE 20 MG/ML IJ SOLN
INTRAMUSCULAR | Status: DC | PRN
  Administered 2018-10-04: 100 mg via INTRAVENOUS

## 2018-10-04 MED ORDER — LIDOCAINE 2% (20 MG/ML) 5 ML SYRINGE
INTRAMUSCULAR | Status: DC | PRN
  Administered 2018-10-04: 50 mg via INTRAVENOUS

## 2018-10-04 MED ORDER — MORPHINE SULFATE (PF) 2 MG/ML IV SOLN
0.5000 mg | INTRAVENOUS | Status: DC | PRN
  Administered 2018-10-04: 0.5 mg via INTRAVENOUS
  Filled 2018-10-04: qty 1

## 2018-10-04 MED ORDER — ONDANSETRON HCL 4 MG/2ML IJ SOLN
INTRAMUSCULAR | Status: DC | PRN
  Administered 2018-10-04: 4 mg via INTRAVENOUS

## 2018-10-04 MED ORDER — FENTANYL CITRATE (PF) 250 MCG/5ML IJ SOLN
INTRAMUSCULAR | Status: AC
  Filled 2018-10-04: qty 5

## 2018-10-04 MED ORDER — 0.9 % SODIUM CHLORIDE (POUR BTL) OPTIME
TOPICAL | Status: DC | PRN
  Administered 2018-10-04: 1000 mL

## 2018-10-04 MED ORDER — SODIUM CHLORIDE 0.9 % IV SOLN
INTRAVENOUS | Status: DC | PRN
  Administered 2018-10-04: 18:00:00 80 ug/min via INTRAVENOUS

## 2018-10-04 MED ORDER — ONDANSETRON HCL 4 MG/2ML IJ SOLN: 4.0000 mg | Freq: Four times a day (QID) | INTRAMUSCULAR | Status: DC | PRN

## 2018-10-04 MED ORDER — ACETAMINOPHEN 650 MG RE SUPP: 650.0000 mg | Freq: Four times a day (QID) | RECTAL | Status: DC | PRN

## 2018-10-04 MED ORDER — AMIODARONE HCL 200 MG PO TABS
200.0000 mg | ORAL_TABLET | Freq: Every day | ORAL | Status: DC
  Administered 2018-10-07 – 2018-10-11 (×3): 200 mg via ORAL
  Filled 2018-10-04 (×3): qty 1

## 2018-10-04 MED ORDER — PROPOFOL 10 MG/ML IV BOLUS
INTRAVENOUS | Status: DC | PRN
  Administered 2018-10-04: 80 mg via INTRAVENOUS

## 2018-10-04 MED ORDER — OXYCODONE HCL 5 MG PO TABS: 5.0000 mg | ORAL_TABLET | ORAL | Status: DC | PRN

## 2018-10-04 MED ORDER — HYDROXYZINE HCL 25 MG PO TABS: 25.0000 mg | ORAL_TABLET | Freq: Three times a day (TID) | ORAL | Status: DC | PRN

## 2018-10-04 MED ORDER — ONDANSETRON HCL 4 MG PO TABS: 4.0000 mg | ORAL_TABLET | Freq: Four times a day (QID) | ORAL | Status: DC | PRN

## 2018-10-04 MED ORDER — ASPIRIN EC 325 MG PO TBEC: 325.0000 mg | DELAYED_RELEASE_TABLET | Freq: Every day | ORAL | Status: DC

## 2018-10-04 MED ORDER — DOCUSATE SODIUM 100 MG PO CAPS: 100.0000 mg | ORAL_CAPSULE | Freq: Two times a day (BID) | ORAL | Status: DC

## 2018-10-04 MED ORDER — GABAPENTIN 300 MG PO CAPS
300.0000 mg | ORAL_CAPSULE | Freq: Three times a day (TID) | ORAL | Status: DC
  Administered 2018-10-04 – 2018-10-05 (×2): 300 mg via ORAL
  Filled 2018-10-04 (×2): qty 1

## 2018-10-04 MED ORDER — NEPRO/CARBSTEADY PO LIQD: 237.0000 mL | Freq: Three times a day (TID) | ORAL | Status: DC | PRN

## 2018-10-04 MED ORDER — METHOCARBAMOL 500 MG PO TABS: 500.0000 mg | ORAL_TABLET | Freq: Four times a day (QID) | ORAL | Status: DC | PRN

## 2018-10-04 MED ORDER — ZOLPIDEM TARTRATE 5 MG PO TABS: 5.0000 mg | ORAL_TABLET | Freq: Every evening | ORAL | Status: DC | PRN

## 2018-10-04 MED ORDER — METOCLOPRAMIDE HCL 5 MG/ML IJ SOLN: 5.0000 mg | Freq: Three times a day (TID) | INTRAMUSCULAR | Status: DC | PRN

## 2018-10-04 MED ORDER — DOCUSATE SODIUM 100 MG PO CAPS
100.0000 mg | ORAL_CAPSULE | Freq: Two times a day (BID) | ORAL | Status: DC
  Administered 2018-10-04 – 2018-10-11 (×12): 100 mg via ORAL
  Filled 2018-10-04 (×12): qty 1

## 2018-10-04 MED ORDER — CHLORHEXIDINE GLUCONATE 4 % EX LIQD: 60.0000 mL | Freq: Once | CUTANEOUS | Status: DC

## 2018-10-04 MED ORDER — POLYETHYLENE GLYCOL 3350 17 G PO PACK: 17.0000 g | PACK | Freq: Every day | ORAL | Status: DC | PRN

## 2018-10-04 MED ORDER — CHLORHEXIDINE GLUCONATE CLOTH 2 % EX PADS
6.0000 | MEDICATED_PAD | Freq: Every day | CUTANEOUS | Status: DC
  Administered 2018-10-06 – 2018-10-07 (×2): 6 via TOPICAL

## 2018-10-04 MED ORDER — FENTANYL CITRATE (PF) 250 MCG/5ML IJ SOLN
INTRAMUSCULAR | Status: DC | PRN
  Administered 2018-10-04: 100 ug via INTRAVENOUS

## 2018-10-04 MED ORDER — METHOCARBAMOL 500 MG PO TABS
500.0000 mg | ORAL_TABLET | Freq: Four times a day (QID) | ORAL | Status: DC | PRN
  Administered 2018-10-04: 500 mg via ORAL

## 2018-10-04 MED ORDER — CEFAZOLIN SODIUM-DEXTROSE 2-4 GM/100ML-% IV SOLN
2.0000 g | INTRAVENOUS | Status: AC
  Administered 2018-10-04: 18:00:00 2 g via INTRAVENOUS

## 2018-10-04 MED ORDER — DOCUSATE SODIUM 283 MG RE ENEM
1.0000 | ENEMA | RECTAL | Status: DC | PRN
  Filled 2018-10-04: qty 1

## 2018-10-04 MED ORDER — ISOSORBIDE MONONITRATE ER 60 MG PO TB24
60.0000 mg | ORAL_TABLET | Freq: Every day | ORAL | Status: DC
  Administered 2018-10-07 – 2018-10-09 (×2): 60 mg via ORAL
  Filled 2018-10-04 (×3): qty 1

## 2018-10-04 MED ORDER — CARVEDILOL 3.125 MG PO TABS
3.1250 mg | ORAL_TABLET | Freq: Two times a day (BID) | ORAL | Status: DC
  Administered 2018-10-05 – 2018-10-11 (×7): 3.125 mg via ORAL
  Filled 2018-10-04 (×9): qty 1

## 2018-10-04 MED ORDER — PHENYLEPHRINE 40 MCG/ML (10ML) SYRINGE FOR IV PUSH (FOR BLOOD PRESSURE SUPPORT)
PREFILLED_SYRINGE | INTRAVENOUS | Status: DC | PRN
  Administered 2018-10-04 (×2): 120 ug via INTRAVENOUS
  Administered 2018-10-04: 160 ug via INTRAVENOUS

## 2018-10-04 MED ORDER — HYDROCODONE-ACETAMINOPHEN 5-325 MG PO TABS
ORAL_TABLET | ORAL | Status: AC
  Filled 2018-10-04: qty 2

## 2018-10-04 MED ORDER — CALCIUM CARBONATE ANTACID 1250 MG/5ML PO SUSP: 500.0000 mg | Freq: Four times a day (QID) | ORAL | Status: DC | PRN

## 2018-10-04 MED ORDER — ACETAMINOPHEN 325 MG PO TABS
325.0000 mg | ORAL_TABLET | Freq: Four times a day (QID) | ORAL | Status: DC | PRN
  Administered 2018-10-05 – 2018-10-11 (×13): 650 mg via ORAL
  Filled 2018-10-04 (×11): qty 2

## 2018-10-04 MED ORDER — BISACODYL 5 MG PO TBEC
5.0000 mg | DELAYED_RELEASE_TABLET | Freq: Every day | ORAL | Status: DC | PRN
  Administered 2018-10-08: 5 mg via ORAL
  Filled 2018-10-04: qty 1

## 2018-10-04 MED ORDER — CAMPHOR-MENTHOL 0.5-0.5 % EX LOTN
1.0000 "application " | TOPICAL_LOTION | Freq: Three times a day (TID) | CUTANEOUS | Status: DC | PRN
  Filled 2018-10-04: qty 222

## 2018-10-04 MED ORDER — DEXAMETHASONE SODIUM PHOSPHATE 10 MG/ML IJ SOLN
INTRAMUSCULAR | Status: DC | PRN
  Administered 2018-10-04: 4 mg via INTRAVENOUS

## 2018-10-04 MED ORDER — MORPHINE SULFATE (PF) 4 MG/ML IV SOLN
4.0000 mg | INTRAVENOUS | Status: DC | PRN
  Administered 2018-10-04 (×2): 4 mg via INTRAVENOUS
  Filled 2018-10-04 (×2): qty 1

## 2018-10-04 MED ORDER — ACETAMINOPHEN 325 MG PO TABS: 650.0000 mg | ORAL_TABLET | Freq: Four times a day (QID) | ORAL | Status: DC | PRN

## 2018-10-04 MED ORDER — PRAVASTATIN SODIUM 40 MG PO TABS
40.0000 mg | ORAL_TABLET | Freq: Every day | ORAL | Status: DC
  Administered 2018-10-07 – 2018-10-10 (×4): 40 mg via ORAL
  Filled 2018-10-04 (×4): qty 1

## 2018-10-04 MED ORDER — SEVELAMER CARBONATE 800 MG PO TABS
2400.0000 mg | ORAL_TABLET | Freq: Three times a day (TID) | ORAL | Status: DC
  Administered 2018-10-05 – 2018-10-11 (×16): 2400 mg via ORAL
  Filled 2018-10-04 (×17): qty 3

## 2018-10-04 MED ORDER — SORBITOL 70 % SOLN
30.0000 mL | Status: DC | PRN
  Filled 2018-10-04: qty 30

## 2018-10-04 MED ORDER — OXYCODONE HCL 5 MG PO TABS: 10.0000 mg | ORAL_TABLET | ORAL | Status: DC | PRN

## 2018-10-04 MED ORDER — METHOCARBAMOL 1000 MG/10ML IJ SOLN
500.0000 mg | Freq: Four times a day (QID) | INTRAVENOUS | Status: DC | PRN
  Administered 2018-10-06: 500 mg via INTRAVENOUS
  Filled 2018-10-04: qty 500
  Filled 2018-10-04: qty 5

## 2018-10-04 MED ORDER — SEVELAMER CARBONATE 800 MG PO TABS
1600.0000 mg | ORAL_TABLET | ORAL | Status: DC
  Administered 2018-10-05 – 2018-10-09 (×5): 1600 mg via ORAL
  Filled 2018-10-04 (×3): qty 2

## 2018-10-04 MED ORDER — HYDROMORPHONE HCL 1 MG/ML IJ SOLN
0.5000 mg | INTRAMUSCULAR | Status: DC | PRN
  Administered 2018-10-04 – 2018-10-05 (×2): 1 mg via INTRAVENOUS
  Administered 2018-10-06: 0.5 mg via INTRAVENOUS
  Filled 2018-10-04: qty 1

## 2018-10-04 MED ORDER — METHOCARBAMOL 500 MG PO TABS
ORAL_TABLET | ORAL | Status: AC
  Filled 2018-10-04: qty 1

## 2018-10-04 MED ORDER — FENTANYL CITRATE (PF) 100 MCG/2ML IJ SOLN: 25.0000 ug | INTRAMUSCULAR | Status: DC | PRN

## 2018-10-04 MED ORDER — METHOCARBAMOL 1000 MG/10ML IJ SOLN: 500.0000 mg | Freq: Four times a day (QID) | INTRAVENOUS | Status: DC | PRN

## 2018-10-04 MED ORDER — POVIDONE-IODINE 10 % EX SWAB: 2.0000 "application " | Freq: Once | CUTANEOUS | Status: DC

## 2018-10-04 MED ORDER — SODIUM CHLORIDE 0.9 % IV SOLN
INTRAVENOUS | Status: DC
  Administered 2018-10-04: 500 mL via INTRAVENOUS

## 2018-10-04 SURGICAL SUPPLY — 38 items
ALCOHOL 70% 16 OZ (MISCELLANEOUS) ×3 IMPLANT
BIT DRILL FLUTED FEMUR 4.2/3 (BIT) ×2 IMPLANT
BNDG COHESIVE 6X5 TAN STRL LF (GAUZE/BANDAGES/DRESSINGS) ×6 IMPLANT
CANISTER SUCTION WELLS/JOHNSON (MISCELLANEOUS) ×3 IMPLANT
COVER PERINEAL POST (MISCELLANEOUS) ×3 IMPLANT
COVER SURGICAL LIGHT HANDLE (MISCELLANEOUS) ×3 IMPLANT
COVER WAND RF STERILE (DRAPES) ×3 IMPLANT
DRAPE HALF SHEET 40X57 (DRAPES) IMPLANT
DRAPE INCISE IOBAN 66X45 STRL (DRAPES) ×3 IMPLANT
DRAPE STERI IOBAN 125X83 (DRAPES) ×3 IMPLANT
DRSG ADAPTIC 3X8 NADH LF (GAUZE/BANDAGES/DRESSINGS) ×3 IMPLANT
DURAPREP 26ML APPLICATOR (WOUND CARE) ×3 IMPLANT
ELECT CAUTERY BLADE 6.4 (BLADE) ×3 IMPLANT
ELECT REM PT RETURN 9FT ADLT (ELECTROSURGICAL) ×3
ELECTRODE REM PT RTRN 9FT ADLT (ELECTROSURGICAL) ×1 IMPLANT
GAUZE SPONGE 4X4 12PLY STRL (GAUZE/BANDAGES/DRESSINGS) ×2 IMPLANT
GAUZE SPONGE 4X4 12PLY STRL LF (GAUZE/BANDAGES/DRESSINGS) ×3 IMPLANT
GLOVE BIO SURGEON STRL SZ7.5 (GLOVE) ×3 IMPLANT
GLOVE BIOGEL PI IND STRL 8 (GLOVE) ×1 IMPLANT
GLOVE BIOGEL PI INDICATOR 8 (GLOVE) ×2
GOWN STRL REUS W/ TWL LRG LVL3 (GOWN DISPOSABLE) ×1 IMPLANT
GOWN STRL REUS W/ TWL XL LVL3 (GOWN DISPOSABLE) ×1 IMPLANT
GOWN STRL REUS W/TWL LRG LVL3 (GOWN DISPOSABLE) ×3
GOWN STRL REUS W/TWL XL LVL3 (GOWN DISPOSABLE) ×3
GUIDEWIRE 3.2X400 (WIRE) ×2 IMPLANT
KIT BASIN OR (CUSTOM PROCEDURE TRAY) ×3 IMPLANT
KIT TURNOVER KIT B (KITS) ×3 IMPLANT
NAIL TROCH FIX 10X235 RT 130 (Nail) ×2 IMPLANT
NS IRRIG 1000ML POUR BTL (IV SOLUTION) ×3 IMPLANT
PACK GENERAL/GYN (CUSTOM PROCEDURE TRAY) ×3 IMPLANT
PAD ARMBOARD 7.5X6 YLW CONV (MISCELLANEOUS) ×6 IMPLANT
SCREW CANN LOCK TI FT 5X30 (Screw) ×2 IMPLANT
SCREW TFNA 90MM HIP (Screw) ×2 IMPLANT
STAPLER VISISTAT 35W (STAPLE) ×3 IMPLANT
SUT MON AB 2-0 CT1 36 (SUTURE) ×3 IMPLANT
TOWEL OR 17X24 6PK STRL BLUE (TOWEL DISPOSABLE) ×3 IMPLANT
TOWEL OR 17X26 10 PK STRL BLUE (TOWEL DISPOSABLE) ×3 IMPLANT
WATER STERILE IRR 1000ML POUR (IV SOLUTION) ×3 IMPLANT

## 2018-10-04 NOTE — Transfer of Care (Signed)
Immediate Anesthesia Transfer of Care Note  Patient: Valerie Terrell  Procedure(s) Performed: INTRAMEDULLARY (IM) NAIL INTERTROCHANTRIC (Right Hip)  Patient Location: PACU  Anesthesia Type:General  Level of Consciousness: awake and alert   Airway & Oxygen Therapy: Patient Spontanous Breathing and Patient connected to face mask oxygen  Post-op Assessment: Report given to RN and Post -op Vital signs reviewed and stable  Post vital signs: Reviewed and stable  Last Vitals:  Vitals Value Taken Time  BP    Temp    Pulse 80 10/04/2018  7:05 PM  Resp 12 10/04/2018  7:05 PM  SpO2 100 % 10/04/2018  7:05 PM  Vitals shown include unvalidated device data.  Last Pain:  Vitals:   10/04/18 1707  TempSrc:   PainSc: 2       Patients Stated Pain Goal: 2 (90/93/11 2162)  Complications: No apparent anesthesia complications

## 2018-10-04 NOTE — Consult Note (Addendum)
Reason for Consult:Right hip fx Referring Physician: J Long  Valerie Terrell is an 85 y.o. female.  HPI: Valerie Terrell was on her way to HD today when she missed a step and fell onto her right side. She had immediate right hip pain and could not get up or even move. She was brought to the ED via EMS where x-rays showed a right hip fx and orthopedic surgery was consulted. She c/o horrible right hip pain.  Past Medical History:  Diagnosis Date  . Anemia of chronic disease 2018   + anemia of CRI?  [name] Atrophy of left kidney    with absent blood flow by renal artery dopplers (Dr. Gwenlyn Found)  . 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 Chronic combined systolic and diastolic CHF (congestive heart failure) ([place])    a. 11/2016: echo showing EF of 35-40%, RV strain noted, mild MR and mild TR.   Echo showed much improved EF 12/2017 (55-60%)  . Chronic respiratory failure with hypoxia ([place]) 02/11/2017   Now on chronic O2 (intermittent as of summer 2019).    . Debilitated patient    WC dependent as of 2018  . ESRD on hemodialysis ([place]) 01/2017   T/Th/Sat schedule- [place].  Right basilic AV fistula 98/9211--HER unable to successfully access this.  Dr. Donnetta Hutching to do an upper arm AV graft as of 10/2017..  . History of adenomatous polyp of colon   . History of kidney stones    passed  . History of subarachnoid hemorrhage 10/2014   after syncope and while on xarelto  . HOH (hard of hearing)   . Hyperlipidemia   . Hypertension    Difficult to control, in the setting of one functioning kidney: pt was referred to nephrology by Dr. Gwenlyn Found 06/2015.  . Lumbar radiculopathy 2012  . Lumbar spinal stenosis 2019   facet injections only very short term relief.  L1 and L2 selective nerve root blocks helpful 04/2018.  . Lumbar spondylosis    MR 07/2016---no sign of spinal nerve compression or cord compression.  Pt set up with outpt ortho while admitted to hosp 07/2016.  . Malnourished ([place])   . Metatarsal fracture  06/10/2016   Nondisplaced, left 5th metatarsal--pt was referred to ortho  . Osteopenia 2014   T-score -2.1  . PAF (paroxysmal atrial fibrillation) ([facility])    Eliquis started after BRAO and CVA.  She was changed to warfarin 2018--INR/coumadin mgmt per HD/nephrol.  . Presence of permanent cardiac pacemaker   . Right rib fracture 12/2016   s/p fall  . Sick sinus syndrome ([place]) 11/2016   Dual chamber pacer insertion 2018 (Dr. Lovena Le)  . Stroke ([place]) 74/0814   cardioembolic (had CVA while on no anticoag)--"scattered subacute punctate infarcts: 1 in R parietal lobe and 2 in occipital cortex" on MRI br.  CT angio head/neck: aortic arch athero.  R ICA 20% stenosis, L ICA w/out any stenosis.  . Thoracic back pain 01/2017; 01/2018   2018: Facet?  Dr. Ramos->steroid injection.  01/2018-->CT T spine to check for a new comp fx-->none found.  Selective nerve root inj in T spine helpful on R side.   . Thoracic compression fracture ([place]) 07/2016   T3.  T7 and T8-- T8 kyphoplasty during hosp admission 07/2016.  Neuro referred pt to pain mgmt for consideration of injection 09/2016.  I referred her to endo 08/2016 for consideration of calcitonin treatment.    Past Surgical History:  Procedure Laterality Date  . APPENDECTOMY  child  . AV FISTULA PLACEMENT Right 03/31/2017   Procedure: Right ARM Basilic vein transposition;  Surgeon: Conrad Berea, MD;  Location: [facility] OR;  Service: Vascular;  Laterality: Right;  . AV FISTULA PLACEMENT Right 12/06/2017   Procedure: INSERTION OF ARTERIOVENOUS (AV) GORE-TEX GRAFT ARM RIGHT UPPER EXTREMITY;  Surgeon: [name], MD;  Location: [place] [facility];  Service: Vascular;  Laterality: Right;  . CARDIOVASCULAR STRESS TEST  01/31/2016   Stress myoview: NORMAL/Low risk.  EF 56%.  . [place]  . COLONOSCOPY  2015   + hx of adenomatous polyps.  Need digest health spec in [facility] records to see when pt due for next colonoscopy  . EYE SURGERY Bilateral    Catarct  . INSERTION OF  DIALYSIS CATHETER Right 01/29/2017   Procedure: INSERTION OF DIALYSIS CATHETER- RIGHT INTERNAL JUGULAR;  Surgeon: [name], MD;  Location: [facility] OR;  Service: Vascular;  Laterality: Right;  . IR GENERIC HISTORICAL  07/24/2016   IR KYPHO THORACIC WITH BONE BIOPSY 07/24/2016 Luanne Bras, MD [facility]-INTERV RAD  . KYPHOPLASTY  07/27/2016   T8  . PACEMAKER IMPLANT N/A 12/03/2016   Procedure: Pacemaker Implant;  Surgeon: Evans Lance, MD;  Location: [place] [facility] LAB;  Service: Cardiovascular;  Laterality: N/A;  . Renal artery dopplers  02/26/2016   Her right renal dimension was 11 cm pole to pole with mild to moderate right renal artery stenosis. A right renal aortic ratio was 3.22 suggesting less than a 50% stenosis.  . Right upper arm AV Gore-Tex graft  12/06/2017   Dr. Donnetta Hutching  . TONSILLECTOMY    . TRANSTHORACIC ECHOCARDIOGRAM  01/29/2016; 11/2016; 12/2017   2017: EF 55-60%, normal LV wall motion, grade I DD.  No cardiac source of emboli was seen.  2018: EF 35-40%, could not assess DD due to a-fib, pulm HTN noted. 12/2017 EF 55-60%, nl wall motion, grd II DD, mod MR and pulm regurg, increased pulm pressure (peak 52).    Family History  Problem Relation Age of Onset  . Cancer Mother   . Heart disease Father   . Early death Father   . Sudden Cardiac Death Neg Hx     Social History:  reports that she quit smoking about 46 years ago. She quit after 22.00 years of use. She has never used smokeless tobacco. She reports current alcohol use of about 1.0 standard drinks of alcohol per week. She reports that she does not use drugs.  Allergies:  Allergies  Allergen Reactions  . Clonidine Derivatives Palpitations and Other (See Comments)    VERY SEDATED  . Adhesive [Tape] Other (See Comments)    Tears skin up  . Codeine Nausea Only  . Nickel Rash  . Sulfa Antibiotics Other (See Comments)    Reaction unknown    Medications: I have reviewed the patient's current medications.  Results for orders  placed or performed during the hospital encounter of 10/04/18 (from the past 48 hour(s))  Basic metabolic panel     Status: Abnormal   Collection Time: 10/04/18  1:24 PM  Result Value Ref Range   Sodium 142 135 - 145 mmol/L   Potassium 3.8 3.5 - 5.1 mmol/L   Chloride 99 98 - 111 mmol/L   CO2 27 22 - 32 mmol/L   Glucose, Bld 115 (H) 70 - 99 mg/dL   BUN 23 8 - 23 mg/dL   Creatinine, Ser 6.55 (H) 0.44 - 1.00 mg/dL   Calcium 8.4 (L) 8.9 -  10.3 mg/dL   GFR calc non Af Amer 5 (L) >60 mL/min   GFR calc Af Amer 6 (L) >60 mL/min   Anion gap 16 (H) 5 - 15    Comment: Performed at [facility] [address], [place], [place] [address]  CBC with Differential     Status: Abnormal   Collection Time: 10/04/18  1:24 PM  Result Value Ref Range   WBC 8.6 4.0 - 10.5 K/uL   RBC 3.28 (L) 3.87 - 5.11 MIL/uL   Hemoglobin 10.6 (L) 12.0 - 15.0 g/dL   HCT 34.6 (L) 36.0 - 46.0 %   MCV 105.5 (H) 80.0 - 100.0 fL   MCH 32.3 26.0 - 34.0 pg   MCHC 30.6 30.0 - 36.0 g/dL   RDW 14.8 11.5 - 15.5 %   Platelets 171 150 - 400 K/uL   nRBC 0.0 0.0 - 0.2 %   Neutrophils Relative % 77 %   Neutro Abs 6.7 1.7 - 7.7 K/uL   Lymphocytes Relative 8 %   Lymphs Abs 0.7 0.7 - 4.0 K/uL   Monocytes Relative 13 %   Monocytes Absolute 1.1 (H) 0.1 - 1.0 K/uL   Eosinophils Relative 1 %   Eosinophils Absolute 0.1 0.0 - 0.5 K/uL   Basophils Relative 0 %   Basophils Absolute 0.0 0.0 - 0.1 K/uL   Immature Granulocytes 1 %   Abs Immature Granulocytes 0.06 0.00 - 0.07 K/uL    Comment: Performed at [facility], [place] [address] [place], [place] [address]  Protime-INR     Status: None   Collection Time: 10/04/18  1:24 PM  Result Value Ref Range   Prothrombin Time 12.7 11.4 - 15.2 seconds   INR 1.0 0.8 - 1.2    Comment: (NOTE) INR goal varies based on device and disease states. Performed at [facility], [place] [address] [place], [place] [address]     Dg Chest 1 View  Result Date: 10/04/2018 CLINICAL DATA:   Pain status post fall EXAM: CHEST  1 VIEW COMPARISON:  07/18/2018 FINDINGS: The cardiac silhouette remains enlarged. There is a stable dual chamber left-sided pacemaker in place. There is no pneumothorax. No large pleural effusion. There are few calcified granulomas bilaterally. Old right-sided rib fractures are again seen. The patient is status post remote vertebral augmentation. Aortic calcifications are noted. IMPRESSION: No active disease. Electronically Signed   By: Constance Holster M.D.   On: 10/04/2018 14:28   Ct Head Wo Contrast  Result Date: 10/04/2018 CLINICAL DATA:  Fall onto right hip. EXAM: CT HEAD WITHOUT CONTRAST CT CERVICAL SPINE WITHOUT CONTRAST TECHNIQUE: Multidetector CT imaging of the head and cervical spine was performed following the standard protocol without intravenous contrast. Multiplanar CT image reconstructions of the cervical spine were also generated. COMPARISON:  CT head dated July 28, 2017. CT cervical spine dated December 31, 2016. FINDINGS: CT HEAD FINDINGS Brain: No evidence of acute infarction, hemorrhage, hydrocephalus, extra-axial collection or mass lesion/mass effect. Stable atrophy and chronic microvascular ischemic changes. Vascular: Calcified atherosclerosis at the skullbase. No hyperdense vessel. Skull: Negative for fracture or focal lesion. Sinuses/Orbits: No acute finding. Other: None. CT CERVICAL SPINE FINDINGS Alignment: Normal. Skull base and vertebrae: No acute fracture. No primary bone lesion or focal pathologic process. Soft tissues and spinal canal: No prevertebral fluid or swelling. No visible canal hematoma. Disc levels: Mild-to-moderate multilevel disc height loss and uncovertebral hypertrophy, similar to prior study. Ankylosis of the left C3-C4 facet joint  again noted. Upper chest: Atelectasis in the posterior right upper lobe. Other: Bilateral carotid artery calcific atherosclerosis. IMPRESSION: 1.  No acute intracranial abnormality. 2.  No acute cervical  spine fracture. Electronically Signed   By: Titus Dubin M.D.   On: 10/04/2018 14:53   Ct Cervical Spine Wo Contrast  Result Date: 10/04/2018 CLINICAL DATA:  Fall onto right hip. EXAM: CT HEAD WITHOUT CONTRAST CT CERVICAL SPINE WITHOUT CONTRAST TECHNIQUE: Multidetector CT imaging of the head and cervical spine was performed following the standard protocol without intravenous contrast. Multiplanar CT image reconstructions of the cervical spine were also generated. COMPARISON:  CT head dated July 28, 2017. CT cervical spine dated December 31, 2016. FINDINGS: CT HEAD FINDINGS Brain: No evidence of acute infarction, hemorrhage, hydrocephalus, extra-axial collection or mass lesion/mass effect. Stable atrophy and chronic microvascular ischemic changes. Vascular: Calcified atherosclerosis at the skullbase. No hyperdense vessel. Skull: Negative for fracture or focal lesion. Sinuses/Orbits: No acute finding. Other: None. CT CERVICAL SPINE FINDINGS Alignment: Normal. Skull base and vertebrae: No acute fracture. No primary bone lesion or focal pathologic process. Soft tissues and spinal canal: No prevertebral fluid or swelling. No visible canal hematoma. Disc levels: Mild-to-moderate multilevel disc height loss and uncovertebral hypertrophy, similar to prior study. Ankylosis of the left C3-C4 facet joint again noted. Upper chest: Atelectasis in the posterior right upper lobe. Other: Bilateral carotid artery calcific atherosclerosis. IMPRESSION: 1.  No acute intracranial abnormality. 2.  No acute cervical spine fracture. Electronically Signed   By: Titus Dubin M.D.   On: 10/04/2018 14:53   Dg Hip Unilat W Or Wo Pelvis 2-3 Views Right  Result Date: 10/04/2018 CLINICAL DATA:  Fall with hip pain EXAM: DG HIP (WITH OR WITHOUT PELVIS) 2-3V RIGHT COMPARISON:  07/28/2017 FINDINGS: There is an acute displaced comminuted intratrochanteric fracture of the proximal right femur. There is no dislocation. There is diffuse  osteopenia. There are degenerative changes of the left hip. Vascular calcifications are noted. IMPRESSION: Acute comminuted intratrochanteric fracture of the proximal right femur. Diffuse osteopenia is noted. Electronically Signed   By: Constance Holster M.D.   On: 10/04/2018 14:27    Review of Systems  Constitutional: Negative for weight loss.  HENT: Negative for ear discharge, ear pain, hearing loss and tinnitus.   Eyes: Negative for blurred vision, double vision, photophobia and pain.  Respiratory: Negative for cough, sputum production and shortness of breath.   Cardiovascular: Negative for chest pain.  Gastrointestinal: Negative for abdominal pain, nausea and vomiting.  Genitourinary: Negative for dysuria, flank pain, frequency and urgency.  Musculoskeletal: Positive for joint pain (Right hip). Negative for back pain, falls, myalgias and neck pain.  Neurological: Negative for dizziness, tingling, sensory change, focal weakness, loss of consciousness and headaches.  Endo/Heme/Allergies: Does not bruise/bleed easily.  Psychiatric/Behavioral: Negative for depression, memory loss and substance abuse. The patient is not nervous/anxious.    Blood pressure (!) 176/58, pulse 70, temperature 97.7 F (36.5 C), temperature source Oral, resp. rate 15, SpO2 100 %. Physical Exam  Constitutional: She appears well-developed and well-nourished. No distress.  HENT:  Head: Normocephalic and atraumatic.  Eyes: Conjunctivae are normal. Right eye exhibits no discharge. Left eye exhibits no discharge. No scleral icterus.  Neck: Normal range of motion.  Cardiovascular: Normal rate and regular rhythm.  Respiratory: Effort normal. No respiratory distress.  Musculoskeletal:     Comments: RLE No traumatic wounds, ecchymosis, or rash  Severe TTP hip and knee  No knee or ankle effusion  Could not evaluate  knee stability  Sens DPN, SPN, TN intact  Motor EHL, ext, flex, evers 5/5  DP 2+, PT 0, No significant  edema  Neurological: She is alert.  Skin: Skin is warm and dry. She is not diaphoretic.  Psychiatric: She has a normal mood and affect. Her behavior is normal.    Assessment/Plan: Right hip fx -- Plan on ORIF this afternoon as long as cleared by IM. Appreciate their help. Will check knee films prior to surgery with Dr. Stann Mainland. Multiple medical problems including CVA; rib and compression fractures; pacemaker; afib off AC due to falls; HTN; HLD; ESRD on TTS HD; chronic O2 dependence; and chronic combined CHF -- per IM    Lisette Abu, PA-C Orthopedic Surgery [phone] 10/04/2018, 3:15 PM    Mrs. Sundberg is an 85 year old female well-known to my clinic for chronic pain issues that are managed by my partner.  She has had multiple falls over the last few months.  She was preparing to go to hemodialysis today as regularly scheduled and had a ground-level fall.  She sustained a right hip intertrochanteric fracture.  I agree with the assessment and plan as outlined by Hilbert Odor, PA-C above.  Plan will be for intramedullary nail of right hip this evening.  She will be weightbearing as tolerated postoperatively and will return to the medicine service postoperatively for routine perioperative medical management to include management of her end-stage renal disease on hemodialysis.   Nicholes Stairs

## 2018-10-04 NOTE — ED Notes (Signed)
Nurse Navigator communication: The patient declined to have [place] contact her daughter. She reports that she had already spoken to her and is aware that she may go to surgery at some time.  At this time the admitting Dr. Is at the bed for assessment, c-collar has been removed by this RN.

## 2018-10-04 NOTE — Progress Notes (Signed)
Patient arrived to unit from PACU.Report received from [facility]), RN. Surgical site clean, dry, and intact.

## 2018-10-04 NOTE — Anesthesia Procedure Notes (Signed)
Procedure Name: Intubation Performed by: Milford Cage, CRNA Pre-anesthesia Checklist: Patient identified, Emergency Drugs available, Suction available and Patient being monitored Patient Re-evaluated:Patient Re-evaluated prior to induction Oxygen Delivery Method: Circle System Utilized Preoxygenation: Pre-oxygenation with 100% oxygen Induction Type: IV induction and Rapid sequence Laryngoscope Size: Mac and 3 Grade View: Grade III Tube type: Oral Number of attempts: 1 Airway Equipment and Method: Bougie stylet Placement Confirmation: ETT inserted through vocal cords under direct vision,  positive ETCO2 and breath sounds checked- equal and bilateral Secured at: 22 cm Tube secured with: Tape Dental Injury: Teeth and Oropharynx as per pre-operative assessment  Difficulty Due To: Difficulty was unanticipated and Difficult Airway- due to anterior larynx Comments: Utilized bougie with G3 view. Recommend Glidescope

## 2018-10-04 NOTE — Discharge Instructions (Signed)
Ortho d/c/ instructions:  - ok for full Weight bearing as tolerated to the right hip. - maintain postop bandage until your follow up appointment in 2 weeks.  Ok to shower with this bandage in place but do not submerge under water. - for the prevention of blood clots take an aspirin 325 mg once per day for 6 weeks. - apply ice to the right hip at the bandages for 30 minutes at a time - return to see DR Stann Mainland in 2 weeks for follow up and staple removal.

## 2018-10-04 NOTE — Progress Notes (Signed)
Patient gave Valerie Terrell permission to cut off her shirt in Short Stay for emergency surgery.

## 2018-10-04 NOTE — ED Provider Notes (Signed)
Emergency Department Provider Note   I have reviewed the triage vital signs and the nursing notes.   HISTORY  Chief Complaint Hip Pain   HPI Valerie Terrell is a 85 y.o. female with PMH of ESRD (TRS HD), chronic respiratory failure, A-fib (no longer anticoagulated), and anemia of chronic disease presents to the emergency department by EMS after fall. She was last admitted in March 2020 after fall and perineal hematoma requiring transfusion. Her Coumadin has since been discontinued. Patient was leaving the house today to attend her regularly scheduled dialysis when she missed a step and fell to the ground.  She does not believe she struck her head.  She denies any loss of consciousness.  No chest pain, palpitations, lightheadedness, or shortness of breath prior to falling.  Patient feels as if she stepped wrong on her floor and fell.  She has severe pain in the right hip is worse with movement.  Radiates to the right groin.  She denies prior history of hip fracture but does have prior fractures of the pubic ramus and sacrum.  She last had dialysis on Saturday.  She denies any fevers, chills, productive cough, sore throat symptoms. No other modifying factors or radiation of symptoms.   Past Medical History:  Diagnosis Date  . Anemia of chronic disease 2018   + anemia of CRI?  [name] Atrial fibrillation ([place]) [I48.91] 12/11/2016  . Atrophy of left kidney    with absent blood flow by renal artery dopplers (Dr. Gwenlyn Found)  . 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 Chronic combined systolic and diastolic CHF (congestive heart failure) ([place])    a. 11/2016: echo showing EF of 35-40%, RV strain noted, mild MR and mild TR.   Echo showed much improved EF 12/2017 (55-60%)  . Chronic respiratory failure with hypoxia ([place]) 02/11/2017   Now on chronic O2 (intermittent as of summer 2019).    . Debilitated patient    WC dependent as of 2018  . ESRD on hemodialysis ([place]) 01/2017   T/Th/Sat schedule-  [place].  Right basilic AV fistula 81/4481--EHU unable to successfully access this.  Dr. Donnetta Hutching to do an upper arm AV graft as of 10/2017..  . History of adenomatous polyp of colon   . History of kidney stones    passed  . History of subarachnoid hemorrhage 10/2014   after syncope and while on xarelto  . HOH (hard of hearing)   . Hyperlipidemia   . Hypertension    Difficult to control, in the setting of one functioning kidney: pt was referred to nephrology by Dr. Gwenlyn Found 06/2015.  . Lumbar radiculopathy 2012  . Lumbar spinal stenosis 2019   facet injections only very short term relief.  L1 and L2 selective nerve root blocks helpful 04/2018.  . Lumbar spondylosis    MR 07/2016---no sign of spinal nerve compression or cord compression.  Pt set up with outpt ortho while admitted to hosp 07/2016.  . Malnourished ([place])   . Metatarsal fracture 06/10/2016   Nondisplaced, left 5th metatarsal--pt was referred to ortho  . Nonischemic cardiomyopathy ([place])    EF improved to 55-60% on echo 12/2017  . Osteopenia 2014   T-score -2.1  . PAF (paroxysmal atrial fibrillation) (H[facility])    Eliquis started after BRAO and CVA.  She was changed to warfarin 2018--INR/coumadin mgmt per HD/nephrol.  . Presence of permanent cardiac pacemaker   . Right rib fracture 12/2016   s/p fall  . Sick sinus syndrome (  [place]) 11/2016   Dual chamber pacer insertion 2018 (Dr. Lovena Le)  . Stroke ([place]) 74/2595   cardioembolic (had CVA while on no anticoag)--"scattered subacute punctate infarcts: 1 in R parietal lobe and 2 in occipital cortex" on MRI br.  CT angio head/neck: aortic arch athero.  R ICA 20% stenosis, L ICA w/out any stenosis.  . Thoracic back pain 01/2017; 01/2018   2018: Facet?  Dr. Ramos->steroid injection.  01/2018-->CT T spine to check for a new comp fx-->none found.  Selective nerve root inj in T spine helpful on R side.   . Thoracic compression fracture ([place]) 07/2016   T3.  T7 and T8-- T8 kyphoplasty during hosp  admission 07/2016.  Neuro referred pt to pain mgmt for consideration of injection 09/2016.  I referred her to endo 08/2016 for consideration of calcitonin treatment.    Patient Active Problem List   Diagnosis Date Noted  . Fracture of pubic ramus ([place]) 07/19/2018  . Fracture of sacrum ([place]) 07/19/2018  . Perineal hematoma 07/19/2018  . Fever 07/19/2018  . Acute blood loss anemia 07/19/2018  . ESRD (end stage renal disease) on dialysis ([place]) 05/20/2018  . Chronic back pain 05/14/2018  . Chronic pain syndrome 05/14/2018  . Hypoxia 12/14/2017  . Dyspnea 12/14/2017  . Degeneration of thoracic intervertebral disc 07/26/2017  . Acute respiratory failure ([place]) 02/13/2017  . Hyperkalemia 02/13/2017  . ESRD on dialysis ([place]) 02/11/2017  . Chronic respiratory failure with hypoxia ([place]) 02/11/2017  . Debilitated patient 02/07/2017  . Poor tolerance for ambulation 02/07/2017  . Fracture of one rib, right side, initial encounter for closed fracture 01/11/2017  . Closed fracture of one rib of right side   . Chronic combined systolic and diastolic heart failure ([place]) 01/05/2017  . Palpitations 12/15/2016  . Hypokalemia 12/14/2016  . Leukocytosis 12/14/2016  . Vaunda Gutterman term (current) use of anticoagulants [Z79.01] 12/11/2016  . Non-ischemic cardiomyopathy ([place]) 12/07/2016  . Acute combined systolic and diastolic heart failure ([place])  12/07/2016  . Cardiac pacemaker   . Acute on chronic respiratory failure with hypoxia ([place])   . Acute pulmonary edema with congestive heart failure ([place]) 11/30/2016  . Elevated troponin level 11/30/2016  . Anemia 11/30/2016  . Chronic midline thoracic back pain 09/15/2016  . History of CVA (cerebrovascular accident) 09/15/2016  . Hyperlipidemia   . Nausea & vomiting 08/05/2016  . Dehydration 08/05/2016  . Osteoporosis 07/28/2016  . Closed compression fracture of thoracic vertebra ([place]) 07/23/2016  . Thoracic compression fracture ([place]) 07/22/2016  . Flank pain  07/11/2016  . Hypoalbuminemia 07/11/2016  . Renal artery stenosis ([place]) 06/24/2016  . Nondisplaced fracture of fifth metatarsal bone, left foot, initial encounter for closed fracture 06/10/2016  . Rib contusion, left, initial encounter 06/10/2016  . Single kidney 03/13/2016  . Chest pain   . Dyslipidemia   . PAF (paroxysmal atrial fibrillation) ([place]) 01/28/2016  . Essential hypertension 01/28/2016  . History of cardioembolic stroke 63/87/5643  . Personal history of subarachnoid hemorrhage 01/28/2016  . CKD (chronic kidney disease), stage IV ([place]) 01/28/2016  . Gait disturbance 01/27/2016  . Mitral valve disease 01/08/2014  . Colon polyps 07/10/2013  . Lumbar radiculopathy 02/04/2011    Past Surgical History:  Procedure Laterality Date  . APPENDECTOMY  child  . AV FISTULA PLACEMENT Right 03/31/2017   Procedure: Right ARM Basilic vein transposition;  Surgeon: Conrad Elderton, MD;  Location: [facility];  Service: Vascular;  Laterality: Right;  . AV FISTULA PLACEMENT Right 12/06/2017   Procedure: INSERTION OF  ARTERIOVENOUS (AV) GORE-TEX GRAFT ARM RIGHT UPPER EXTREMITY;  Surgeon: [name], MD;  Location: [facility];  Service: Vascular;  Laterality: Right;  . CARDIOVASCULAR STRESS TEST  01/31/2016   Stress myoview: NORMAL/Low risk.  EF 56%.  . [place]  . COLONOSCOPY  2015   + hx of adenomatous polyps.  Need digest health spec in [facility] records to see when pt due for next colonoscopy  . EYE SURGERY Bilateral    Catarct  . INSERTION OF DIALYSIS CATHETER Right 01/29/2017   Procedure: INSERTION OF DIALYSIS CATHETER- RIGHT INTERNAL JUGULAR;  Surgeon: [name], MD;  Location: [facility];  Service: Vascular;  Laterality: Right;  . IR GENERIC HISTORICAL  07/24/2016   IR KYPHO THORACIC WITH BONE BIOPSY 07/24/2016 Luanne Bras, MD [facility]-INTER[facility] RAD  . KYPHOPLASTY  07/27/2016   T8  . PACEMAKER IMPLANT N/A 12/03/2016   Procedure: Pacemaker Implant;  Surgeon: Evans Lance, MD;  Location:  [facility];  Service: Cardiovascular;  Laterality: N/A;  . Renal artery dopplers  02/26/2016   Her right renal dimension was 11 cm pole to pole with mild to moderate right renal artery stenosis. A right renal aortic ratio was 3.22 suggesting less than a 50% stenosis.  . Right upper arm AV Gore-Tex graft  12/06/2017   Dr. Donnetta Hutching  . TONSILLECTOMY    . TRANSTHORACIC ECHOCARDIOGRAM  01/29/2016; 11/2016; 12/2017   2017: EF 55-60%, normal LV wall motion, grade I DD.  No cardiac source of emboli was seen.  2018: EF 35-40%, could not assess DD due to a-fib, pulm HTN noted. 12/2017 EF 55-60%, nl wall motion, grd II DD, mod MR and pulm regurg, increased pulm pressure (peak 52).    Allergies Clonidine derivatives; Adhesive [tape]; Codeine; Nickel; and Sulfa antibiotics  Family History  Problem Relation Age of Onset  . Cancer Mother   . Heart disease Father   . Early death Father   . Sudden Cardiac Death Neg Hx     Social History Social History   Tobacco Use  . Smoking status: Former Smoker    Years: 22.00    Last attempt to quit: 03/17/1972    Years since quitting: 46.5  . Smokeless tobacco: Never Used  Substance Use Topics  . Alcohol use: Yes    Alcohol/week: 1.0 standard drinks    Types: 1 Glasses of wine per week    Comment: wine  . Drug use: No    Review of Systems  Constitutional: No fever/chills Eyes: No visual changes. ENT: No sore throat. Cardiovascular: Denies chest pain. Respiratory: Denies shortness of breath. Gastrointestinal: No abdominal pain.  No nausea, no vomiting.  No diarrhea.  No constipation. Genitourinary: Negative for dysuria. Musculoskeletal: Negative for back pain. Positive right hip/groin pain.  Skin: Negative for rash. Neurological: Negative for headaches, focal weakness or numbness.  10-point ROS otherwise negative.  ____________________________________________   PHYSICAL EXAM:  VITAL SIGNS: ED Triage Vitals  Enc Vitals Group     BP  10/04/18 1308 (!) 176/58     Pulse Rate 10/04/18 1308 75     Resp 10/04/18 1308 15     Temp 10/04/18 1309 97.7 F (36.5 C)     Temp Source 10/04/18 1309 Oral     SpO2 10/04/18 1308 98 %     Pain Score 10/04/18 1312 5   Constitutional: Alert and oriented. Well appearing and in no acute distress. Eyes: Conjunctivae are normal. PERRL.  Head: Atraumatic. Nose: No congestion/rhinnorhea.  Mouth/Throat: Mucous membranes are moist. Neck: No stridor. C-collar in place.  Cardiovascular: Normal rate, regular rhythm. Good peripheral circulation. Grossly normal heart sounds.   Respiratory: Normal respiratory effort.  No retractions. Lungs CTAB. Gastrointestinal: Soft and nontender. No distention.  Musculoskeletal: External rotation of the right foot with exquisite tenderness with any motion in the right hip.  No knee tenderness or ankle discomfort.  No gross deformity other than the rotation and slight shortening. Normal ROM of the left hip. No upper extremity tenderness.  Neurologic:  Normal speech and language. No gross focal neurologic deficits are appreciated.  Skin:  Skin is warm, dry and intact. No rash noted.  ____________________________________________   LABS (all labs ordered are listed, but only abnormal results are displayed)  Labs Reviewed  BASIC METABOLIC PANEL - Abnormal; Notable for the following components:      Result Value   Glucose, Bld 115 (*)    Creatinine, Ser 6.55 (*)    Calcium 8.4 (*)    GFR calc non Af Amer 5 (*)    GFR calc Af Amer 6 (*)    Anion gap 16 (*)    All other components within normal limits  CBC WITH DIFFERENTIAL/PLATELET - Abnormal; Notable for the following components:   RBC 3.28 (*)    Hemoglobin 10.6 (*)    HCT 34.6 (*)    MCV 105.5 (*)    Monocytes Absolute 1.1 (*)    All other components within normal limits  SARS CORONAVIRUS 2 (HOSPITAL ORDER, [facility])  PROTIME-INR    ____________________________________________  EKG   EKG Interpretation  Date/Time:  Tuesday Oct 04 2018 13:35:14 EDT Ventricular Rate:  70 PR Interval:    QRS Duration: 105 QT Interval:  461 QTC Calculation: 498 R Axis:   87 Text Interpretation:  Sinus rhythm Borderline right axis deviation Borderline prolonged QT interval No STEMI  Similar to prior Confirmed by Nanda Quinton [id number]) on 10/04/2018 1:37:01 PM Also confirmed by Nanda Quinton [phone]), editor Hattie Perch ([id number])  on 10/04/2018 3:06:36 PM       ____________________________________________  RADIOLOGY  Dg Chest 1 View  Result Date: 10/04/2018 CLINICAL DATA:  Pain status post fall EXAM: CHEST  1 VIEW COMPARISON:  07/18/2018 FINDINGS: The cardiac silhouette remains enlarged. There is a stable dual chamber left-sided pacemaker in place. There is no pneumothorax. No large pleural effusion. There are few calcified granulomas bilaterally. Old right-sided rib fractures are again seen. The patient is status post remote vertebral augmentation. Aortic calcifications are noted. IMPRESSION: No active disease. Electronically Signed   By: Constance Holster M.D.   On: 10/04/2018 14:28   Dg Hip Unilat W Or Wo Pelvis 2-3 Views Right  Result Date: 10/04/2018 CLINICAL DATA:  Fall with hip pain EXAM: DG HIP (WITH OR WITHOUT PELVIS) 2-3V RIGHT COMPARISON:  07/28/2017 FINDINGS: There is an acute displaced comminuted intratrochanteric fracture of the proximal right femur. There is no dislocation. There is diffuse osteopenia. There are degenerative changes of the left hip. Vascular calcifications are noted. IMPRESSION: Acute comminuted intratrochanteric fracture of the proximal right femur. Diffuse osteopenia is noted. Electronically Signed   By: Constance Holster M.D.   On: 10/04/2018 14:27    ____________________________________________   PROCEDURES  Procedure(s) performed:   Procedures  None   ____________________________________________   INITIAL IMPRESSION / ASSESSMENT AND PLAN / ED COURSE  Pertinent labs & imaging results that were available during my care of the patient were reviewed by me and  considered in my medical decision making (see chart for details).   Patient presents to the emergency department by EMS after mechanical fall at home.  She has a complicated medical history including dialysis.  She does have oxygen in place here.  Prior notes describe chronic respiratory failure with intermittent oxygen requirement over the last year.  Patient does not appear acutely volume overloaded.  Very low suspicion for infectious etiology.  My suspicion for hip fracture is elevated given my exam findings and history.  Plan for additional pain medication, screening labs, CT imaging of the head and cervical spine, along with plain films of the right hip.   02:45 PM  Labs and plain films reviewed. Potassium WNL. CXR without acute edema or infiltrate. COVID testing is pending. Discussed right hip fracture found on plain film with the patient. Spoke with Ortho who will consult in the ED. Will discuss admit with the hospitalist.   Discussed patient's case with Hospitalist, Dr. Lorin Mercy to request admission. Patient and family (if present) updated with plan. Care transferred to Hospitalist service.  I reviewed all nursing notes, vitals, pertinent old records, EKGs, labs, imaging (as available).  ____________________________________________  FINAL CLINICAL IMPRESSION(S) / ED DIAGNOSES  Final diagnoses:  Closed fracture of right hip, initial encounter ([place])    MEDICATIONS GIVEN DURING THIS VISIT:  Medications  morphine 4 MG/ML injection 4 mg (4 mg Intravenous Given 10/04/18 1347)     Note:  This document was prepared using Dragon voice recognition software and may include unintentional dictation errors.  Nanda Quinton, MD Emergency Medicine    Jaydn Moscato, [facility], MD 10/04/18 [phone]

## 2018-10-04 NOTE — Anesthesia Preprocedure Evaluation (Addendum)
Anesthesia Evaluation  Patient identified by MRN, date of birth, ID band Patient awake    Reviewed: Allergy & Precautions, NPO status , Patient's Chart, lab work & pertinent test results  Airway Mallampati: I  TM Distance: >3 FB Neck ROM: Full    Dental  (+) Teeth Intact, Dental Advisory Given, Caps   Pulmonary former smoker,    breath sounds clear to auscultation       Cardiovascular hypertension, Pt. on home beta blockers +CHF  + dysrhythmias Atrial Fibrillation + pacemaker  Rhythm:Regular Rate:Normal     Neuro/Psych  Neuromuscular disease CVA negative psych ROS   GI/Hepatic negative GI ROS, Neg liver ROS,   Endo/Other    Renal/GU ESRF and DialysisRenal disease     Musculoskeletal  (+) Arthritis ,   Abdominal Normal abdominal exam  (+)   Peds  Hematology   Anesthesia Other Findings   Reproductive/Obstetrics                            Lab Results  Component Value Date   WBC 8.6 10/04/2018   HGB 10.6 (L) 10/04/2018   HCT 34.6 (L) 10/04/2018   MCV 105.5 (H) 10/04/2018   PLT 171 10/04/2018   Lab Results  Component Value Date   CREATININE 6.55 (H) 10/04/2018   BUN 23 10/04/2018   NA 142 10/04/2018   K 3.8 10/04/2018   CL 99 10/04/2018   CO2 27 10/04/2018   Lab Results  Component Value Date   INR 1.0 10/04/2018   INR 1.0 07/22/2018   INR 1.0 07/21/2018   PROTIME 23.4 (A) 07/14/2018   PROTIME 25.5 (A) 07/07/2018   PROTIME 22.9 (A) 06/30/2018   PROTIME 22.9 (A) 06/30/2018   Echo: - Left ventricle: The cavity size was normal. Wall thickness was   increased in a pattern of mild LVH. Systolic function was normal.   The estimated ejection fraction was in the range of 55% to 60%.   Wall motion was normal; there were no regional wall motion   abnormalities. Features are consistent with a pseudonormal left   ventricular filling pattern, with concomitant abnormal relaxation   and  increased filling pressure (grade 2 diastolic dysfunction). - Aortic valve: Trileaflet; moderately thickened, moderately   calcified leaflets. - Mitral valve: Calcified annulus. There was mild to moderate   regurgitation. - Left atrium: The atrium was moderately dilated. - Tricuspid valve: There was mild regurgitation. - Pulmonic valve: There was moderate regurgitation. - Pulmonary arteries: Systolic pressure was moderately increased.   PA peak pressure: 52 mm Hg (S).  Anesthesia Physical Anesthesia Plan  ASA: III  Anesthesia Plan: General   Post-op Pain Management:    Induction: Intravenous  PONV Risk Score and Plan: 4 or greater and Ondansetron, Dexamethasone and Treatment may vary due to age or medical condition  Airway Management Planned: Oral ETT  Additional Equipment: None  Intra-op Plan:   Post-operative Plan: Extubation in OR  Informed Consent: I have reviewed the patients History and Physical, chart, labs and discussed the procedure including the risks, benefits and alternatives for the proposed anesthesia with the patient or authorized representative who has indicated his/her understanding and acceptance.     Dental advisory given  Plan Discussed with: CRNA  Anesthesia Plan Comments: (COVID-19 Labs  No results for input(s): DDIMER, FERRITIN, LDH, CRP in the last 72 hours.  Lab Results      Component  Value               Date                      [id number]              NEGATIVE            10/04/2018            )       Anesthesia Quick Evaluation

## 2018-10-04 NOTE — Progress Notes (Signed)
[facility] Renal Quick Note:  Notified that patient being admitted for R hip fracture and will go for surgery today. Today is her usual TTS dialysis today, but missed today as her fall/injury happened prior to scheduled treatment today.  Reviewing resulted labs, vitals, and CXR - it does not appear that she is in urgent need of dialysis today.   Therefore, will plan on dialysis tomorrow morning. I will put her orders in. Full consult to be done in the morning as well.  [name], PA-C [employer] Pager [phone]

## 2018-10-04 NOTE — ED Triage Notes (Signed)
Pt was leaving house to go to dialysis and when walking down stairs in her garage she missed the last one and fell onto right hip. Pt reports pain in right groin, does have some shorten of right leg- pt has history of broken pelvis per ems. Ems gave pt 200 mcg fentanyl and 4 mg zofran.

## 2018-10-04 NOTE — ED Notes (Signed)
Patient transported to X-ray 

## 2018-10-04 NOTE — ED Notes (Signed)
Nurse Navigator communication: Darnice Comrie the patients daughter called for updates on pt status. At this time the patient has been transferred to the surgical dept for surgery. She expressed that she had spoken with Dr. Corine Shelter and would need to give consent for the procedure. She was also concerned about the patient receiving dialysis. Information given, she expresses her gratitude for the call. I have transferred her to [place], RN in [facility] to give consent.

## 2018-10-04 NOTE — Brief Op Note (Signed)
10/04/2018  6:44 PM  PATIENT:  [name]  85 y.o. female  PRE-OPERATIVE DIAGNOSIS:  Right hip fracture  POST-OPERATIVE DIAGNOSIS:  right hip fracture  PROCEDURE:  Procedure(s): INTRAMEDULLARY (IM) NAIL INTERTROCHANTRIC (Right)  SURGEON:  Surgeon(s) and Role:    * Nicholes Stairs, MD - Primary  PHYSICIAN ASSISTANT:   ASSISTANTS: Katy Apo, RNFA   ANESTHESIA:   general  EBL:  75 cc  BLOOD ADMINISTERED:none  DRAINS: none   LOCAL MEDICATIONS USED:  NONE  SPECIMEN:  No Specimen  DISPOSITION OF SPECIMEN:  N/A  COUNTS:  YES  TOURNIQUET:  n/a  DICTATION: .Note written in EPIC  PLAN OF CARE: Admit to inpatient   PATIENT DISPOSITION:  PACU - hemodynamically stable.   Delay start of Pharmacological VTE agent (>24hrs) due to surgical blood loss or risk of bleeding: not applicable

## 2018-10-04 NOTE — ED Notes (Signed)
Ortho PA at bedside.  

## 2018-10-04 NOTE — ED Notes (Signed)
Patient back from CT.

## 2018-10-04 NOTE — ED Notes (Signed)
ED TO INPATIENT HANDOFF REPORT  ED Nurse Name and Phone #:  Abbe Amsterdam [phone] S Name/Age/Gender Valerie Terrell 85 y.o. female Room/Bed: 047C/047C  Code Status   Code Status: Prior  Home/SNF/Other Home Patient oriented to: self Is this baseline? Yes   Triage Complete: Triage complete  Chief Complaint Fall; Hip Pain; Dialysis Pt  Triage Note Pt was leaving house to go to dialysis and when walking down stairs in her garage she missed the last one and fell onto right hip. Pt reports pain in right groin, does have some shorten of right leg- pt has history of broken pelvis per ems. Ems gave pt 200 mcg fentanyl and 4 mg zofran.    Allergies Allergies  Allergen Reactions  . Clonidine Derivatives Palpitations and Other (See Comments)    VERY SEDATED  . Adhesive [Tape] Other (See Comments)    Tears skin up  . Codeine Nausea Only  . Nickel Rash  . Sulfa Antibiotics Other (See Comments)    Reaction unknown    Level of Care/Admitting Diagnosis ED Disposition    ED Disposition Condition Comment   Admit  The patient appears reasonably stabilized for admission considering the current resources, flow, and capabilities available in the ED at this time, and I doubt any other [facility] requiring further screening and/or treatment in the ED prior to admission is  present.       B Medical/Surgery History Past Medical History:  Diagnosis Date  . Anemia of chronic disease 2018   + anemia of CRI?  [name] Atrophy of left kidney    with absent blood flow by renal artery dopplers (Dr. Gwenlyn Found)  . 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 Chronic combined systolic and diastolic CHF (congestive heart failure) ([place])    a. 11/2016: echo showing EF of 35-40%, RV strain noted, mild MR and mild TR.   Echo showed much improved EF 12/2017 (55-60%)  . Chronic respiratory failure with hypoxia ([place]) 02/11/2017   Now on chronic O2 (intermittent as of summer 2019).    . Debilitated patient    WC dependent as  of 2018  . ESRD on hemodialysis ([place]) 01/2017   T/Th/Sat schedule- [place].  Right basilic AV fistula 14/7829--FAO unable to successfully access this.  Dr. Donnetta Hutching to do an upper arm AV graft as of 10/2017..  . History of adenomatous polyp of colon   . History of kidney stones    passed  . History of subarachnoid hemorrhage 10/2014   after syncope and while on xarelto  . HOH (hard of hearing)   . Hyperlipidemia   . Hypertension    Difficult to control, in the setting of one functioning kidney: pt was referred to nephrology by Dr. Gwenlyn Found 06/2015.  . Lumbar radiculopathy 2012  . Lumbar spinal stenosis 2019   facet injections only very short term relief.  L1 and L2 selective nerve root blocks helpful 04/2018.  . Lumbar spondylosis    MR 07/2016---no sign of spinal nerve compression or cord compression.  Pt set up with outpt ortho while admitted to hosp 07/2016.  . Malnourished ([place])   . Metatarsal fracture 06/10/2016   Nondisplaced, left 5th metatarsal--pt was referred to ortho  . Osteopenia 2014   T-score -2.1  . PAF (paroxysmal atrial fibrillation) (H[facility])    Eliquis started after BRAO and CVA.  She was changed to warfarin 2018--INR/coumadin mgmt per HD/nephrol.  . Presence of permanent cardiac pacemaker   . Right rib fracture 12/2016  s/p fall  . Sick sinus syndrome ([place]) 11/2016   Dual chamber pacer insertion 2018 (Dr. Lovena Le)  . Stroke ([place]) 13/0865   cardioembolic (had CVA while on no anticoag)--"scattered subacute punctate infarcts: 1 in R parietal lobe and 2 in occipital cortex" on MRI br.  CT angio head/neck: aortic arch athero.  R ICA 20% stenosis, L ICA w/out any stenosis.  . Thoracic back pain 01/2017; 01/2018   2018: Facet?  Dr. Ramos->steroid injection.  01/2018-->CT T spine to check for a new comp fx-->none found.  Selective nerve root inj in T spine helpful on R side.   . Thoracic compression fracture ([place]) 07/2016   T3.  T7 and T8-- T8 kyphoplasty during hosp admission  07/2016.  Neuro referred pt to pain mgmt for consideration of injection 09/2016.  I referred her to endo 08/2016 for consideration of calcitonin treatment.   Past Surgical History:  Procedure Laterality Date  . APPENDECTOMY  child  . AV FISTULA PLACEMENT Right 03/31/2017   Procedure: Right ARM Basilic vein transposition;  Surgeon: Conrad Spring Arbor, MD;  Location: [facility] OR;  Service: Vascular;  Laterality: Right;  . AV FISTULA PLACEMENT Right 12/06/2017   Procedure: INSERTION OF ARTERIOVENOUS (AV) GORE-TEX GRAFT ARM RIGHT UPPER EXTREMITY;  Surgeon: [name], MD;  Location: [facility];  Service: Vascular;  Laterality: Right;  . CARDIOVASCULAR STRESS TEST  01/31/2016   Stress myoview: NORMAL/Low risk.  EF 56%.  . [place]  . COLONOSCOPY  2015   + hx of adenomatous polyps.  Need digest health spec in [place] records to see when pt due for next colonoscopy  . EYE SURGERY Bilateral    Catarct  . INSERTION OF DIALYSIS CATHETER Right 01/29/2017   Procedure: INSERTION OF DIALYSIS CATHETER- RIGHT INTERNAL JUGULAR;  Surgeon: [name], MD;  Location: [facility] OR;  Service: Vascular;  Laterality: Right;  . IR GENERIC HISTORICAL  07/24/2016   IR KYPHO THORACIC WITH BONE BIOPSY 07/24/2016 Luanne Bras, MD [facility]-INTERV RAD  . KYPHOPLASTY  07/27/2016   T8  . PACEMAKER IMPLANT N/A 12/03/2016   Procedure: Pacemaker Implant;  Surgeon: Evans Lance, MD;  Location: [facility];  Service: Cardiovascular;  Laterality: N/A;  . Renal artery dopplers  02/26/2016   Her right renal dimension was 11 cm pole to pole with mild to moderate right renal artery stenosis. A right renal aortic ratio was 3.22 suggesting less than a 50% stenosis.  . Right upper arm AV Gore-Tex graft  12/06/2017   Dr. Donnetta Hutching  . TONSILLECTOMY    . TRANSTHORACIC ECHOCARDIOGRAM  01/29/2016; 11/2016; 12/2017   2017: EF 55-60%, normal LV wall motion, grade I DD.  No cardiac source of emboli was seen.  2018: EF 35-40%, could not assess DD due  to a-fib, pulm HTN noted. 12/2017 EF 55-60%, nl wall motion, grd II DD, mod MR and pulm regurg, increased pulm pressure (peak 52).     A IV Location/Drains/Wounds Patient Lines/Drains/Airways Status   Active Line/Drains/Airways    Name:   Placement date:   Placement time:   Site:   Days:   Peripheral IV 10/04/18 Left Hand   10/04/18    1347    Hand   less than 1   Fistula / Graft Right Upper arm Arteriovenous vein graft   12/06/17    0828    Upper arm   302   Hemodialysis Catheter Right Internal jugular   -    -  Internal jugular             Intake/Output Last 24 hours No intake or output data in the 24 hours ending 10/04/18 1512  Labs/Imaging Results for orders placed or performed during the hospital encounter of 10/04/18 (from the past 48 hour(s))  Basic metabolic panel     Status: Abnormal   Collection Time: 10/04/18  1:24 PM  Result Value Ref Range   Sodium 142 135 - 145 mmol/L   Potassium 3.8 3.5 - 5.1 mmol/L   Chloride 99 98 - 111 mmol/L   CO2 27 22 - 32 mmol/L   Glucose, Bld 115 (H) 70 - 99 mg/dL   BUN 23 8 - 23 mg/dL   Creatinine, Ser 6.55 (H) 0.44 - 1.00 mg/dL   Calcium 8.4 (L) 8.9 - 10.3 mg/dL   GFR calc non Af Amer 5 (L) >60 mL/min   GFR calc Af Amer 6 (L) >60 mL/min   Anion gap 16 (H) 5 - 15    Comment: Performed at [facility], [address], [place], [place] [address]  CBC with Differential     Status: Abnormal   Collection Time: 10/04/18  1:24 PM  Result Value Ref Range   WBC 8.6 4.0 - 10.5 K/uL   RBC 3.28 (L) 3.87 - 5.11 MIL/uL   Hemoglobin 10.6 (L) 12.0 - 15.0 g/dL   HCT 34.6 (L) 36.0 - 46.0 %   MCV 105.5 (H) 80.0 - 100.0 fL   MCH 32.3 26.0 - 34.0 pg   MCHC 30.6 30.0 - 36.0 g/dL   RDW 14.8 11.5 - 15.5 %   Platelets 171 150 - 400 K/uL   nRBC 0.0 0.0 - 0.2 %   Neutrophils Relative % 77 %   Neutro Abs 6.7 1.7 - 7.7 K/uL   Lymphocytes Relative 8 %   Lymphs Abs 0.7 0.7 - 4.0 K/uL   Monocytes Relative 13 %   Monocytes Absolute 1.1 (H) 0.1 -  1.0 K/uL   Eosinophils Relative 1 %   Eosinophils Absolute 0.1 0.0 - 0.5 K/uL   Basophils Relative 0 %   Basophils Absolute 0.0 0.0 - 0.1 K/uL   Immature Granulocytes 1 %   Abs Immature Granulocytes 0.06 0.00 - 0.07 K/uL    Comment: Performed at [facility], [place] [address] [place], [place] [address]  Protime-INR     Status: None   Collection Time: 10/04/18  1:24 PM  Result Value Ref Range   Prothrombin Time 12.7 11.4 - 15.2 seconds   INR 1.0 0.8 - 1.2    Comment: (NOTE) INR goal varies based on device and disease states. Performed at [facility], [place] [address], [place], [place] [address]    Dg Chest 1 View  Result Date: 10/04/2018 CLINICAL DATA:  Pain status post fall EXAM: CHEST  1 VIEW COMPARISON:  07/18/2018 FINDINGS: The cardiac silhouette remains enlarged. There is a stable dual chamber left-sided pacemaker in place. There is no pneumothorax. No large pleural effusion. There are few calcified granulomas bilaterally. Old right-sided rib fractures are again seen. The patient is status post remote vertebral augmentation. Aortic calcifications are noted. IMPRESSION: No active disease. Electronically Signed   By: Constance Holster M.D.   On: 10/04/2018 14:28   Ct Head Wo Contrast  Result Date: 10/04/2018 CLINICAL DATA:  Fall onto right hip. EXAM: CT HEAD WITHOUT CONTRAST CT CERVICAL SPINE WITHOUT CONTRAST TECHNIQUE: Multidetector CT imaging of the head and cervical spine  was performed following the standard protocol without intravenous contrast. Multiplanar CT image reconstructions of the cervical spine were also generated. COMPARISON:  CT head dated July 28, 2017. CT cervical spine dated December 31, 2016. FINDINGS: CT HEAD FINDINGS Brain: No evidence of acute infarction, hemorrhage, hydrocephalus, extra-axial collection or mass lesion/mass effect. Stable atrophy and chronic microvascular ischemic changes. Vascular: Calcified atherosclerosis at the skullbase. No hyperdense  vessel. Skull: Negative for fracture or focal lesion. Sinuses/Orbits: No acute finding. Other: None. CT CERVICAL SPINE FINDINGS Alignment: Normal. Skull base and vertebrae: No acute fracture. No primary bone lesion or focal pathologic process. Soft tissues and spinal canal: No prevertebral fluid or swelling. No visible canal hematoma. Disc levels: Mild-to-moderate multilevel disc height loss and uncovertebral hypertrophy, similar to prior study. Ankylosis of the left C3-C4 facet joint again noted. Upper chest: Atelectasis in the posterior right upper lobe. Other: Bilateral carotid artery calcific atherosclerosis. IMPRESSION: 1.  No acute intracranial abnormality. 2.  No acute cervical spine fracture. Electronically Signed   By: Titus Dubin M.D.   On: 10/04/2018 14:53   Ct Cervical Spine Wo Contrast  Result Date: 10/04/2018 CLINICAL DATA:  Fall onto right hip. EXAM: CT HEAD WITHOUT CONTRAST CT CERVICAL SPINE WITHOUT CONTRAST TECHNIQUE: Multidetector CT imaging of the head and cervical spine was performed following the standard protocol without intravenous contrast. Multiplanar CT image reconstructions of the cervical spine were also generated. COMPARISON:  CT head dated July 28, 2017. CT cervical spine dated December 31, 2016. FINDINGS: CT HEAD FINDINGS Brain: No evidence of acute infarction, hemorrhage, hydrocephalus, extra-axial collection or mass lesion/mass effect. Stable atrophy and chronic microvascular ischemic changes. Vascular: Calcified atherosclerosis at the skullbase. No hyperdense vessel. Skull: Negative for fracture or focal lesion. Sinuses/Orbits: No acute finding. Other: None. CT CERVICAL SPINE FINDINGS Alignment: Normal. Skull base and vertebrae: No acute fracture. No primary bone lesion or focal pathologic process. Soft tissues and spinal canal: No prevertebral fluid or swelling. No visible canal hematoma. Disc levels: Mild-to-moderate multilevel disc height loss and uncovertebral  hypertrophy, similar to prior study. Ankylosis of the left C3-C4 facet joint again noted. Upper chest: Atelectasis in the posterior right upper lobe. Other: Bilateral carotid artery calcific atherosclerosis. IMPRESSION: 1.  No acute intracranial abnormality. 2.  No acute cervical spine fracture. Electronically Signed   By: Titus Dubin M.D.   On: 10/04/2018 14:53   Dg Hip Unilat W Or Wo Pelvis 2-3 Views Right  Result Date: 10/04/2018 CLINICAL DATA:  Fall with hip pain EXAM: DG HIP (WITH OR WITHOUT PELVIS) 2-3V RIGHT COMPARISON:  07/28/2017 FINDINGS: There is an acute displaced comminuted intratrochanteric fracture of the proximal right femur. There is no dislocation. There is diffuse osteopenia. There are degenerative changes of the left hip. Vascular calcifications are noted. IMPRESSION: Acute comminuted intratrochanteric fracture of the proximal right femur. Diffuse osteopenia is noted. Electronically Signed   By: Constance Holster M.D.   On: 10/04/2018 14:27    Pending Labs Unresulted Labs (From admission, onward)    Start     Ordered   10/04/18 1324  SARS Coronavirus 2 (CEPHEID - Performed in [place] hospital lab), Hosp Order  (Asymptomatic Patients Labs)  ONCE - STAT,   R    Question:  Rule Out  Answer:  Yes   10/04/18 1323          Vitals/Pain Today's Vitals   10/04/18 1309 10/04/18 1312 10/04/18 1345 10/04/18 1445  BP:      Pulse:   70 70  Resp:   12 15  Temp: 97.7 F (36.5 C)     TempSrc: Oral     SpO2:   100% 100%  PainSc:  5       Isolation Precautions No active isolations  Medications Medications  morphine 4 MG/ML injection 4 mg (4 mg Intravenous Given 10/04/18 1450)    Mobility non-ambulatory Low fall risk   Focused Assessments Cardiac Assessment Handoff:    Lab Results  Component Value Date   TROPONINI 0.04 ([place]) 12/15/2017   No results found for: DDIMER Does the Patient currently have chest pain? Yes     R Recommendations: See Admitting  Provider Note  Report given to:   Additional Notes:  Pt due for dialysis today

## 2018-10-04 NOTE — H&P (Signed)
History and Physical    Valerie Terrell XNT:[id number] DOB: 1933/02/24 DOA: 10/04/2018  PCP: Tammi Sou, MD Consultants:  Gwenlyn Found - cardiology; Lorrene Reid - nephrology Patient coming from:  Home - lives with daughter; NOK: Daughter, Valerie Terrell  Chief Complaint: Hip pain  HPI: Valerie Terrell is a 85 y.o. female with medical history significant of CVA; rib and compression fractures; pacemaker; afib off AC due to falls; HTN; HLD; ESRD on TTS HD; chronic O2 dependence; and chronic combined CHF presenting with hip pain.  She was getting ready for HD and has 3 steps from the kitchen to the garage.  She didn't step well and lost her balance.  She landed on her right hip and experienced excruciating pain.     ED Course:  Getting ready for HD today, missed a step and fell.  Hip fracture.  Ortho wants to fix her hip this afternoon.  Review of Systems: As per HPI; otherwise review of systems reviewed and negative.   Ambulatory Status:  Ambulates with cane or walker  Past Medical History:  Diagnosis Date  . Anemia of chronic disease 2018   + anemia of CRI?  [name] Atrophy of left kidney    with absent blood flow by renal artery dopplers (Dr. Gwenlyn Found)  . 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 Chronic combined systolic and diastolic CHF (congestive heart failure) ([place])    a. 11/2016: echo showing EF of 35-40%, RV strain noted, mild MR and mild TR.   Echo showed much improved EF 12/2017 (55-60%)  . Chronic respiratory failure with hypoxia ([place]) 02/11/2017   Now on chronic O2 (intermittent as of summer 2019).    . Debilitated patient    WC dependent as of 2018  . ESRD on hemodialysis ([place]) 01/2017   T/Th/Sat schedule- [place].  Right basilic AV fistula 96/7591--MBW unable to successfully access this.  Dr. Donnetta Hutching to do an upper arm AV graft as of 10/2017..  . History of adenomatous polyp of colon   . History of kidney stones    passed  . History of subarachnoid hemorrhage 10/2014   after  syncope and while on xarelto  . HOH (hard of hearing)   . Hyperlipidemia   . Hypertension    Difficult to control, in the setting of one functioning kidney: pt was referred to nephrology by Dr. Gwenlyn Found 06/2015.  . Lumbar radiculopathy 2012  . Lumbar spinal stenosis 2019   facet injections only very short term relief.  L1 and L2 selective nerve root blocks helpful 04/2018.  . Lumbar spondylosis    MR 07/2016---no sign of spinal nerve compression or cord compression.  Pt set up with outpt ortho while admitted to hosp 07/2016.  . Malnourished ([place])   . Metatarsal fracture 06/10/2016   Nondisplaced, left 5th metatarsal--pt was referred to ortho  . Osteopenia 2014   T-score -2.1  . PAF (paroxysmal atrial fibrillation) (HCC)    Eliquis started after BRAO and CVA.  She was changed to warfarin 2018--INR/coumadin mgmt per HD/nephrol.  . Presence of permanent cardiac pacemaker   . Right rib fracture 12/2016   s/p fall  . Sick sinus syndrome ([place]) 11/2016   Dual chamber pacer insertion 2018 (Dr. Lovena Le)  . Stroke ([place]) 46/6599   cardioembolic (had CVA while on no anticoag)--"scattered subacute punctate infarcts: 1 in R parietal lobe and 2 in occipital cortex" on MRI br.  CT angio head/neck: aortic arch athero.  R ICA 20% stenosis, L ICA w/out any  stenosis.  . Thoracic back pain 01/2017; 01/2018   2018: Facet?  Dr. Ramos->steroid injection.  01/2018-->CT T spine to check for a new comp fx-->none found.  Selective nerve root inj in T spine helpful on R side.   . Thoracic compression fracture ([place]) 07/2016   T3.  T7 and T8-- T8 kyphoplasty during hosp admission 07/2016.  Neuro referred pt to pain mgmt for consideration of injection 09/2016.  I referred her to endo 08/2016 for consideration of calcitonin treatment.    Past Surgical History:  Procedure Laterality Date  . APPENDECTOMY  child  . AV FISTULA PLACEMENT Right 03/31/2017   Procedure: Right ARM Basilic vein transposition;  Surgeon: Conrad McKinnon,  MD;  Location: [facility] OR;  Service: Vascular;  Laterality: Right;  . AV FISTULA PLACEMENT Right 12/06/2017   Procedure: INSERTION OF ARTERIOVENOUS (AV) GORE-TEX GRAFT ARM RIGHT UPPER EXTREMITY;  Surgeon: [name], MD;  Location: [facility];  Service: Vascular;  Laterality: Right;  . CARDIOVASCULAR STRESS TEST  01/31/2016   Stress myoview: NORMAL/Low risk.  EF 56%.  . [place]  . COLONOSCOPY  2015   + hx of adenomatous polyps.  Need digest health spec in [place] records to see when pt due for next colonoscopy  . EYE SURGERY Bilateral    Catarct  . INSERTION OF DIALYSIS CATHETER Right 01/29/2017   Procedure: INSERTION OF DIALYSIS CATHETER- RIGHT INTERNAL JUGULAR;  Surgeon: [name], MD;  Location: [facility] OR;  Service: Vascular;  Laterality: Right;  . IR GENERIC HISTORICAL  07/24/2016   IR KYPHO THORACIC WITH BONE BIOPSY 07/24/2016 Luanne Bras, MD [facility]-INTERV RAD  . KYPHOPLASTY  07/27/2016   T8  . PACEMAKER IMPLANT N/A 12/03/2016   Procedure: Pacemaker Implant;  Surgeon: Evans Lance, MD;  Location: [place] [facility] CV LAB;  Service: Cardiovascular;  Laterality: N/A;  . Renal artery dopplers  02/26/2016   Her right renal dimension was 11 cm pole to pole with mild to moderate right renal artery stenosis. A right renal aortic ratio was 3.22 suggesting less than a 50% stenosis.  . Right upper arm AV Gore-Tex graft  12/06/2017   Dr. Donnetta Hutching  . TONSILLECTOMY    . TRANSTHORACIC ECHOCARDIOGRAM  01/29/2016; 11/2016; 12/2017   2017: EF 55-60%, normal LV wall motion, grade I DD.  No cardiac source of emboli was seen.  2018: EF 35-40%, could not assess DD due to a-fib, pulm HTN noted. 12/2017 EF 55-60%, nl wall motion, grd II DD, mod MR and pulm regurg, increased pulm pressure (peak 52).    Social History   Socioeconomic History  . Marital status: Widowed    Spouse name: Not on file  . Number of children: Not on file  . Years of education: Not on file  . Highest education level: Not on file   Occupational History  . Not on file  Social Needs  . Financial resource strain: Not on file  . Food insecurity:    Worry: Not on file    Inability: Not on file  . Transportation needs:    Medical: Not on file    Non-medical: Not on file  Tobacco Use  . Smoking status: Former Smoker    Years: 22.00    Last attempt to quit: 03/17/1972    Years since quitting: 46.5  . Smokeless tobacco: Never Used  Substance and Sexual Activity  . Alcohol use: Yes    Alcohol/week: 1.0 standard drinks    Types: 1 Glasses of  wine per week    Comment: wine  . Drug use: No  . Sexual activity: Never  Lifestyle  . Physical activity:    Days per week: Not on file    Minutes per session: Not on file  . Stress: Not on file  Relationships  . Social connections:    Talks on phone: Not on file    Gets together: Not on file    Attends religious service: Not on file    Active member of club or organization: Not on file    Attends meetings of clubs or organizations: Not on file    Relationship status: Not on file  . Intimate partner violence:    Fear of current or ex partner: Not on file    Emotionally abused: Not on file    Physically abused: Not on file    Forced sexual activity: Not on file  Other Topics Concern  . Not on file  Social History Narrative   Widow.  One daughter, lives with her.   Educ: college   Occup: retired Marine scientist.   No T/A/Ds.   She is almost a vegetarian.    Allergies  Allergen Reactions  . Clonidine Derivatives Palpitations and Other (See Comments)    VERY SEDATED  . Adhesive [Tape] Other (See Comments)    Tears skin up  . Codeine Nausea Only  . Nickel Rash  . Sulfa Antibiotics Other (See Comments)    Reaction unknown    Family History  Problem Relation Age of Onset  . Cancer Mother   . Heart disease Father   . Early death Father   . Sudden Cardiac Death Neg Hx     Prior to Admission medications   Medication Sig Start Date End Date Taking? Authorizing  Provider  acetaminophen (TYLENOL) 500 MG tablet Take 500-1,000 mg 3 (three) times daily as needed by mouth for moderate pain.    Yes [provider]  amiodarone (PACERONE) 200 MG tablet TAKE 1 TABLET BY MOUTH  DAILY Patient taking differently: Take 200 mg by mouth daily.  04/11/18  Yes Lorretta Harp, MD  b complex-vitamin c-folic acid (NEPHRO-VITE) 0.8 MG TABS tablet Take 1 tablet by mouth daily. 09/07/18  Yes [provider]  carvedilol (COREG) 3.125 MG tablet TAKE 1 TABLET BY MOUTH TWO  TIMES DAILY Patient taking differently: Take 3.125 mg by mouth 2 (two) times daily with a meal.  06/27/18  Yes Lorretta Harp, MD  isosorbide mononitrate (IMDUR) 60 MG 24 hr tablet TAKE 1 TABLET BY MOUTH EVERY DAY Patient taking differently: Take 60 mg by mouth daily.  08/29/18  Yes McGowen, Adrian Blackwater, MD  ondansetron (ZOFRAN) 4 MG tablet Take 1 tablet (4 mg total) by mouth every 8 (eight) hours as needed for nausea or vomiting. 06/03/18  Yes McGowen, Adrian Blackwater, MD  polyvinyl alcohol (LIQUIFILM TEARS) 1.4 % ophthalmic solution Place 1-2 drops into both eyes as needed for dry eyes.   Yes [provider]  pravastatin (PRAVACHOL) 40 MG tablet TAKE 1 TABLET BY MOUTH  EVERY EVENING Patient taking differently: Take 40 mg by mouth daily.  03/09/18  Yes McGowen, Adrian Blackwater, MD  vitamin C (ASCORBIC ACID) 500 MG tablet Take 500 mg by mouth daily.   Yes [provider]  Vitamin D, Ergocalciferol, (DRISDOL) 1.25 MG (50000 UT) CAPS capsule Take 1 capsule (50,000 Units total) by mouth every Thursday. 04/28/18  Yes McGowen, Adrian Blackwater, MD  polyethylene glycol (MIRALAX / Floria Raveling) packet Take  17 g by mouth 2 (two) times daily. 07/22/18   Reyne Dumas, MD  sevelamer carbonate (RENVELA) 800 MG tablet Take 800 mg by mouth 3 (three) times daily with meals.     [provider]    Physical Exam: Vitals:   10/04/18 1308 10/04/18 1309 10/04/18 1345 10/04/18 1445  BP: (!) 176/58     Pulse: 75   70 70  Resp: 15  12 15   Temp:  97.7 F (36.5 C)    TempSrc:  Oral    SpO2: 98%  100% 100%     . General:  Appears calm but uncomfortable, clearly in pain . Eyes:  PERRL, EOMI, normal lids, iris . ENT:  grossly normal hearing, lips & tongue, mmm . Neck:  no LAD, masses or thyromegaly . Cardiovascular:  RRR, no m/r/g. No LE edema.  [name] Respiratory:   CTA bilaterally with no wheezes/rales/rhonchi.  Normal respiratory effort. . Abdomen:  soft, NT, ND, NABS . Skin:  no rash or induration seen on limited exam . Musculoskeletal:  RLE shortening and external rotation . Lower extremity:  No LE edema.  Limited foot exam with no ulcerations.  2+ distal pulses. [name] Psychiatric:  grossly normal mood and affect, speech fluent and appropriate, AOx3 . Neurologic:  CN 2-12 grossly intact, moves all extremities in coordinated fashion, sensation intact    Radiological Exams on Admission: Dg Chest 1 View  Result Date: 10/04/2018 CLINICAL DATA:  Pain status post fall EXAM: CHEST  1 VIEW COMPARISON:  07/18/2018 FINDINGS: The cardiac silhouette remains enlarged. There is a stable dual chamber left-sided pacemaker in place. There is no pneumothorax. No large pleural effusion. There are few calcified granulomas bilaterally. Old right-sided rib fractures are again seen. The patient is status post remote vertebral augmentation. Aortic calcifications are noted. IMPRESSION: No active disease. Electronically Signed   By: Constance Holster M.D.   On: 10/04/2018 14:28   Ct Head Wo Contrast  Result Date: 10/04/2018 CLINICAL DATA:  Fall onto right hip. EXAM: CT HEAD WITHOUT CONTRAST CT CERVICAL SPINE WITHOUT CONTRAST TECHNIQUE: Multidetector CT imaging of the head and cervical spine was performed following the standard protocol without intravenous contrast. Multiplanar CT image reconstructions of the cervical spine were also generated. COMPARISON:  CT head dated July 28, 2017. CT cervical spine dated December 31, 2016.  FINDINGS: CT HEAD FINDINGS Brain: No evidence of acute infarction, hemorrhage, hydrocephalus, extra-axial collection or mass lesion/mass effect. Stable atrophy and chronic microvascular ischemic changes. Vascular: Calcified atherosclerosis at the skullbase. No hyperdense vessel. Skull: Negative for fracture or focal lesion. Sinuses/Orbits: No acute finding. Other: None. CT CERVICAL SPINE FINDINGS Alignment: Normal. Skull base and vertebrae: No acute fracture. No primary bone lesion or focal pathologic process. Soft tissues and spinal canal: No prevertebral fluid or swelling. No visible canal hematoma. Disc levels: Mild-to-moderate multilevel disc height loss and uncovertebral hypertrophy, similar to prior study. Ankylosis of the left C3-C4 facet joint again noted. Upper chest: Atelectasis in the posterior right upper lobe. Other: Bilateral carotid artery calcific atherosclerosis. IMPRESSION: 1.  No acute intracranial abnormality. 2.  No acute cervical spine fracture. Electronically Signed   By: Titus Dubin M.D.   On: 10/04/2018 14:53   Ct Cervical Spine Wo Contrast  Result Date: 10/04/2018 CLINICAL DATA:  Fall onto right hip. EXAM: CT HEAD WITHOUT CONTRAST CT CERVICAL SPINE WITHOUT CONTRAST TECHNIQUE: Multidetector CT imaging of the head and cervical spine was performed following the standard protocol without intravenous contrast. Multiplanar CT image reconstructions  of the cervical spine were also generated. COMPARISON:  CT head dated July 28, 2017. CT cervical spine dated December 31, 2016. FINDINGS: CT HEAD FINDINGS Brain: No evidence of acute infarction, hemorrhage, hydrocephalus, extra-axial collection or mass lesion/mass effect. Stable atrophy and chronic microvascular ischemic changes. Vascular: Calcified atherosclerosis at the skullbase. No hyperdense vessel. Skull: Negative for fracture or focal lesion. Sinuses/Orbits: No acute finding. Other: None. CT CERVICAL SPINE FINDINGS Alignment: Normal. Skull  base and vertebrae: No acute fracture. No primary bone lesion or focal pathologic process. Soft tissues and spinal canal: No prevertebral fluid or swelling. No visible canal hematoma. Disc levels: Mild-to-moderate multilevel disc height loss and uncovertebral hypertrophy, similar to prior study. Ankylosis of the left C3-C4 facet joint again noted. Upper chest: Atelectasis in the posterior right upper lobe. Other: Bilateral carotid artery calcific atherosclerosis. IMPRESSION: 1.  No acute intracranial abnormality. 2.  No acute cervical spine fracture. Electronically Signed   By: Titus Dubin M.D.   On: 10/04/2018 14:53   Dg Hip Unilat W Or Wo Pelvis 2-3 Views Right  Result Date: 10/04/2018 CLINICAL DATA:  Fall with hip pain EXAM: DG HIP (WITH OR WITHOUT PELVIS) 2-3V RIGHT COMPARISON:  07/28/2017 FINDINGS: There is an acute displaced comminuted intratrochanteric fracture of the proximal right femur. There is no dislocation. There is diffuse osteopenia. There are degenerative changes of the left hip. Vascular calcifications are noted. IMPRESSION: Acute comminuted intratrochanteric fracture of the proximal right femur. Diffuse osteopenia is noted. Electronically Signed   By: Constance Holster M.D.   On: 10/04/2018 14:27    EKG: Independently reviewed.  NSR with rate 70; nonspecific ST changes with no evidence of acute ischemia; NSCSLT   Labs on Admission: I have personally reviewed the available labs and imaging studies at the time of the admission.  Pertinent labs:   Glucose 115 BUN 23/Creatinine 6.55/GFR 5 Hgb 10.6; 11.8 on 3/6 INR 1.0 COVID pending  Assessment/Plan Principal Problem:   Hip fracture (HCC) Active Problems:   PAF (paroxysmal atrial fibrillation) (HCC)   Essential hypertension   Cardiac pacemaker   Chronic combined systolic and diastolic heart failure (HCC)   ESRD (end stage renal disease) on dialysis (HCC)   Chronic pain syndrome   Hip fracture -Mechanical fall  resulting in hip fracture -Orthopedics consulted -NPO in anticipation of surgical repair this afternoon -SCDs overnight, start Lovenox post-operatively (or as per ortho) -Pain control with Robxain, Vicodin, and Fentanyl prn -SW consult for rehab placement; CIR is preferred -Will need PT consult post-operatively -Hip fracture order set utilized  Afib, not on AC -Rate controlled with Amiodarone, Coreg and has pacemaker -She is no longer taking anticoagulation medication  HTN -Continue Coreg  Chronic combined CHF -Appears to be compensated at this time -7/19 Echo with preserved EF and grade 2 diastolic dysfunction  ESRD on HD -Patient on chronic TTS HD -Nephrology prn order set utilized -She does not appear to be volume overloaded or otherwise in need of acute HD -However, she is due for HD today and so Dr. Jonnie Finner has been notified in case nephrology wishes to proceed with HD following surgery  Chronic pain syndrome -Takes Vicodin and Percocet for pain at home -Pain control as noted above for now    Note: This patient has been tested and is negative for the novel coronavirus COVID-19.   DVT prophylaxis:  SCDs until approved for Lovenox by orthopedics Code Status:  FULL - confirmed with patient Family Communication: None present; I left a message for  her daughter  Disposition Plan:  Home once clinically improved Consults called: Orthopedics; SW, Nutrition; will need PT post-operatively  Admission status: Admit - It is my clinical opinion that admission to INPATIENT is reasonable and necessary because of the expectation that this patient will require hospital care that crosses at least 2 midnights to treat this condition based on the medical complexity of the problems presented.  Given the aforementioned information, the predictability of an adverse outcome is felt to be significant.     Karmen Bongo MD [facility]   How to contact the [facility] Attending or Consulting  provider [place] or covering provider during after hours [place], for this patient?  1. Check the care team in [facility] and look for a) attending/consulting [facility] provider listed and b) the [facility] team listed 2. Log into [email or web address] and use [place]'s universal password to access. If you do not have the password, please contact the hospital operator. 3. Locate the [facility] provider you are looking for under [facility] and page to a number that you can be directly reached. 4. If you still have difficulty reaching the provider, please page the [facility] (Director on Call) for the Hospitalists listed on amion for assistance.   10/04/2018, 4:47 PM

## 2018-10-04 NOTE — Anesthesia Postprocedure Evaluation (Signed)
Anesthesia Post Note  Patient: Valerie Terrell  Procedure(s) Performed: INTRAMEDULLARY (IM) NAIL INTERTROCHANTRIC (Right Hip)     Patient location during evaluation: PACU Anesthesia Type: General Level of consciousness: patient cooperative, oriented and sedated Pain management: pain level controlled Vital Signs Assessment: post-procedure vital signs reviewed and stable Respiratory status: spontaneous breathing, nonlabored ventilation, respiratory function stable and patient connected to nasal cannula oxygen Cardiovascular status: blood pressure returned to baseline and stable Postop Assessment: no apparent nausea or vomiting Anesthetic complications: no    Last Vitals:  Vitals:   10/04/18 1935 10/04/18 1950  BP: (!) 109/48 114/86  Pulse: 78   Resp: 12 20  Temp:    SpO2: 100%     Last Pain:  Vitals:   10/04/18 1935  TempSrc:   PainSc: 7                  Eun Vermeer,E. Darryl Blumenstein

## 2018-10-04 NOTE — Op Note (Signed)
Date of Surgery: 10/04/2018  INDICATIONS: Valerie Terrell is a 85 y.o.-year-old female who sustained a right hip fracture. The risks and benefits of the procedure discussed with the patient and family prior to the procedure and all questions were answered; consent was obtained.  PREOPERATIVE DIAGNOSIS: right hip fracture   POSTOPERATIVE DIAGNOSIS: Same   PROCEDURE: Treatment of intertrochanteric, pertrochanteric, subtrochanteric fracture with intramedullary implant. CPT [phone]   SURGEON: Dannielle Karvonen. Stann Mainland, M.D.   ANESTHESIA: general   IV FLUIDS AND URINE: See anesthesia record   ESTIMATED BLOOD LOSS: 75 cc  IMPLANTS:   Synthes TFN A 10 x 235 mm 30 mm x 5.0 mm distal interlock  DRAINS: None.   COMPLICATIONS: None.   DESCRIPTION OF PROCEDURE: The patient was brought to the operating room and placed supine on the operating table. The patient's leg had been signed prior to the procedure. The patient had the anesthesia placed by the anesthesiologist. The prep verification and incision time-outs were performed to confirm that this was the correct patient, site, side and location. The patient had an SCD on the opposite lower extremity. The patient did receive antibiotics prior to the incision and was re-dosed during the procedure as needed at indicated intervals. The patient was positioned on the fracture table with the table in traction and internal rotation to reduce the hip. The well leg was placed in a scissor position and all bony prominences were well-padded. The patient had the lower extremity prepped and draped in the standard surgical fashion. The incision was made 4 finger breadths superior to the greater trochanter. A guide pin was inserted into the tip of the greater trochanter under fluoroscopic guidance. An opening reamer was used to gain access to the femoral canal. The nail length was measured and inserted down the femoral canal to its proper depth. The appropriate version of  insertion for the lag screw was found under fluoroscopy. A pin was inserted up the femoral neck through the jig. The length of the lag screw was then measured. The lag screw was inserted as near to center-center in the head as possible. The leg was taken out of traction, then the compression screw was used to compress across the fracture. Compression was visualized on serial xrays.   We next turned our attention to the distal interlocking screw.  This was placed through the drill guide of the nail inserter.  A small incision was made overlying the lateral thigh at the screw site, and a tonsil was used to disect down to bone.  A drill pass was made through the jig and across the nail through both cortices.  This was measured, and the appropriate screw was placed under hand power and found to have good bite.    The wound was copiously irrigated with saline and the subcutaneous layer closed with 2.0 monocryl and the skin was reapproximated with staples. The wounds were cleaned and dried a final time and a sterile dressing was placed. The hip was taken through a range of motion at the end of the case under fluoroscopic imaging to visualize the approach-withdraw phenomenon and confirm implant length in the head. The patient was then awakened from anesthesia and taken to the recovery room in stable condition. All counts were correct at the end of the case.   POSTOPERATIVE PLAN: The patient will be weight bearing as tolerated and will return in 2 weeks for staple removal and the patient will receive DVT prophylaxis based on other medications, activity level,  and risk ratio of bleeding to thrombosis.     Valerie Rile, MD [facility] [phone] 6:47 PM

## 2018-10-05 ENCOUNTER — Telehealth: Payer: Self-pay | Admitting: *Deleted

## 2018-10-05 ENCOUNTER — Encounter (HOSPITAL_COMMUNITY): Payer: Self-pay | Admitting: Orthopedic Surgery

## 2018-10-05 LAB — CBC
HCT: 28.2 % — ABNORMAL LOW (ref 36.0–46.0)
Hemoglobin: 8.8 g/dL — ABNORMAL LOW (ref 12.0–15.0)
MCH: 32.4 pg (ref 26.0–34.0)
MCHC: 31.2 g/dL (ref 30.0–36.0)
MCV: 103.7 fL — ABNORMAL HIGH (ref 80.0–100.0)
Platelets: 163 10*3/uL (ref 150–400)
RBC: 2.72 MIL/uL — ABNORMAL LOW (ref 3.87–5.11)
RDW: 14.7 % (ref 11.5–15.5)
WBC: 7.9 10*3/uL (ref 4.0–10.5)
nRBC: 0.3 % — ABNORMAL HIGH (ref 0.0–0.2)

## 2018-10-05 LAB — BASIC METABOLIC PANEL
Anion gap: 18 — ABNORMAL HIGH (ref 5–15)
BUN: 32 mg/dL — ABNORMAL HIGH (ref 8–23)
CO2: 26 mmol/L (ref 22–32)
Calcium: 8.3 mg/dL — ABNORMAL LOW (ref 8.9–10.3)
Chloride: 95 mmol/L — ABNORMAL LOW (ref 98–111)
Creatinine, Ser: 7.23 mg/dL — ABNORMAL HIGH (ref 0.44–1.00)
GFR calc Af Amer: 6 mL/min — ABNORMAL LOW (ref >60)
GFR calc non Af Amer: 5 mL/min — ABNORMAL LOW (ref >60)
Glucose, Bld: 142 mg/dL — ABNORMAL HIGH (ref 70–99)
Potassium: 5.3 mmol/L — ABNORMAL HIGH (ref 3.5–5.1)
Sodium: 139 mmol/L (ref 135–145)

## 2018-10-05 MED ORDER — DOXERCALCIFEROL 4 MCG/2ML IV SOLN
5.0000 ug | INTRAVENOUS | Status: DC
  Administered 2018-10-08: 16:00:00 5 ug via INTRAVENOUS
  Filled 2018-10-05 (×3): qty 4

## 2018-10-05 MED ORDER — LIDOCAINE-PRILOCAINE 2.5-2.5 % EX CREA: 1.0000 "application " | TOPICAL_CREAM | CUTANEOUS | Status: DC | PRN

## 2018-10-05 MED ORDER — GABAPENTIN 100 MG PO CAPS
100.0000 mg | ORAL_CAPSULE | Freq: Three times a day (TID) | ORAL | Status: DC
  Administered 2018-10-05: 100 mg via ORAL
  Filled 2018-10-05: qty 1

## 2018-10-05 MED ORDER — SODIUM CHLORIDE 0.9 % IV SOLN: 100.0000 mL | INTRAVENOUS | Status: DC | PRN

## 2018-10-05 MED ORDER — LIDOCAINE HCL (PF) 1 % IJ SOLN: 5.0000 mL | INTRAMUSCULAR | Status: DC | PRN

## 2018-10-05 MED ORDER — HYDROMORPHONE HCL 1 MG/ML IJ SOLN
INTRAMUSCULAR | Status: AC
  Administered 2018-10-05: 1 mg via INTRAVENOUS
  Filled 2018-10-05: qty 1

## 2018-10-05 MED ORDER — ACETAMINOPHEN 325 MG PO TABS
ORAL_TABLET | ORAL | Status: AC
  Administered 2018-10-05: 650 mg via ORAL
  Filled 2018-10-05: qty 2

## 2018-10-05 MED ORDER — PENTAFLUOROPROP-TETRAFLUOROETH EX AERO: 1.0000 "application " | INHALATION_SPRAY | CUTANEOUS | Status: DC | PRN

## 2018-10-05 MED ORDER — HEPARIN SODIUM (PORCINE) 1000 UNIT/ML DIALYSIS: 1000.0000 [IU] | INTRAMUSCULAR | Status: DC | PRN

## 2018-10-05 NOTE — Progress Notes (Signed)
Pt. Unable to complete HD treatment due to surgical pain. Pt. Wit new  Somewhat confusion at this time Pt. Previously medicated as documented for pain. [name] PA at bedside to assess ok to terminate HD tx. May return pt. To rrom. Report telephoned to primary nurse Netta Neat RN made aware of pt. Change of mental status. Pt. Stable returned to room.

## 2018-10-05 NOTE — Progress Notes (Signed)
OT Cancellation Note  Patient Details Name: Shaianne Nucci MRN: [id number] DOB: [birth year]   Cancelled Treatment:    Reason Eval/Treat Not Completed: Active bedrest order  Richelle Ito, OTR/L  Acute Rehabilitation Services Pager: [phone] Office: [phone] .  10/05/2018, 4:00 PM

## 2018-10-05 NOTE — Progress Notes (Signed)
PT Cancellation Note  Patient Details Name: Valerie Terrell MRN: [id number] DOB: January 24, 1933   Cancelled Treatment:    Reason Eval/Treat Not Completed: Patient at procedure or test/unavailable (HD). Will follow-up for PT evaluation as schedule permits.  Mabeline Caras, PT, DPT Acute Rehabilitation Services  Pager [phone] Office [place] 10/05/2018, 7:33 AM

## 2018-10-05 NOTE — Progress Notes (Signed)
PROGRESS NOTE    Valerie Terrell  WUJ:[id number] DOB: 1932-07-26 DOA: 10/04/2018 PCP: Tammi Sou, MD    Brief Narrative:  85 y.o. female with medical history significant of CVA; rib and compression fractures; pacemaker; afib off AC due to falls; HTN; HLD; ESRD on TTS HD; chronic O2 dependence; and chronic combined CHF presenting with hip pain.  She was getting ready for HD and has 3 steps from the kitchen to the garage.  She didn't step well and lost her balance.  She landed on her right hip and experienced excruciating pain.  Assessment & Plan:   Principal Problem:   Hip fracture ([place]) Active Problems:   PAF (paroxysmal atrial fibrillation) (HCC)   Essential hypertension   Cardiac pacemaker   Chronic combined systolic and diastolic heart failure (HCC)   ESRD (end stage renal disease) on dialysis [facility])   Chronic pain syndrome   Hip fracture -Mechanical fall resulting in hip fracture -Orthopedics consulted, s/p surgery 5/19 -Continue analgesic as needed -SW consult for rehab placement; -PT consulted  Afib, not on AC -Rate controlled with Amiodarone, Coreg and has pacemaker -She is no longer taking anticoagulation medication -Seems stable at present  HTN -Continue Coreg as tolerated -Noted to be hypotensive while on HD, vitals reviewed  Chronic combined CHF -Appears to be compensated at this time -7/19 Echo with preserved EF and grade 2 diastolic dysfunction -Stable at present  ESRD on HD -Patient on chronic TTS HD -Nephrology consulted -Did not tolerate remainder of HD this AM secondary to pain and acute encephalopathy, see below  Chronic pain syndrome -Takes Vicodin and Percocet for pain at home -Continue with analgesic as needed  Acute toxic metabolic encephalopathy -Noted to be more confused while on HD -per staff, mentation worsened after taking post-op analgesic -Avoid over-sedation if possible  DVT prophylaxis: SCD's Code Status: Full Family  Communication: Pt in room, family not at bedside Disposition Plan: Uncertain at this time  Consultants:   Orthopedic Surgery  Nephrology  Procedures:  Treatment of intertrochanteric, pertrochanteric, subtrochanteric fracture with intramedullary implant  Antimicrobials: Anti-infectives (From admission, onward)   Start     Dose/Rate Route Frequency Ordered Stop   10/04/18 1745  ceFAZolin (ANCEF) IVPB 2g/100 mL premix     2 g 200 mL/hr over 30 Minutes Intravenous On call to O.R. 10/04/18 1656 10/04/18 1805   10/04/18 1657  ceFAZolin (ANCEF) 2-4 GM/100ML-% IVPB    Note to Pharmacy:  Henrine Screws   : cabinet override      10/04/18 1657 10/04/18 1805       Subjective: Seems confused but seems to be complaining of pain  Objective: Vitals:   10/05/18 0930 10/05/18 1000 10/05/18 1011 10/05/18 1350  BP: (!) 90/37 (!) 73/33 (!) 111/42 (!) 75/29  Pulse: 82 78 78 73  Resp:   (!) 78 17  Temp:   98.1 F (36.7 C) 97.9 F (36.6 C)  TempSrc:   Axillary Oral  SpO2:   94% (!) 83%  Weight:   58.9 kg   Height:        Intake/Output Summary (Last 24 hours) at 10/05/2018 1419 Last data filed at 10/05/2018 1300 Gross per 24 hour  Intake 690 ml  Output 250 ml  Net 440 ml   Filed Weights   10/04/18 1707 10/05/18 0700 10/05/18 1011  Weight: 55.3 kg 58.9 kg 58.9 kg    Examination:  General exam: Appears calm and comfortable  Respiratory system: Clear to auscultation. Respiratory effort normal. Cardiovascular  system: S1 & S2 heard, RRR. Gastrointestinal system: Abdomen is nondistended, soft and nontender. No organomegaly or masses felt. Normal bowel sounds heard. Central nervous system:asleep, arousable No focal neurological deficits. Extremities: Symmetric 5 x 5 power. Skin: No rashes, lesions Psychiatry: seems confused while on HD. Mood & affect appropriate.   Data Reviewed: I have personally reviewed following labs and imaging studies  CBC: Recent Labs  Lab 10/04/18 1324  10/05/18 0346  WBC 8.6 7.9  NEUTROABS 6.7  --   HGB 10.6* 8.8*  HCT 34.6* 28.2*  MCV 105.5* 103.7*  PLT 171 626   Basic Metabolic Panel: Recent Labs  Lab 10/04/18 1324 10/05/18 0346  NA 142 139  K 3.8 5.3*  CL 99 95*  CO2 27 26  GLUCOSE 115* 142*  BUN 23 32*  CREATININE 6.55* 7.23*  CALCIUM 8.4* 8.3*   GFR: Estimated Creatinine Clearance: 4.6 mL/min (A) (by C-G formula based on SCr of 7.23 mg/dL (H)). Liver Function Tests: No results for input(s): AST, ALT, ALKPHOS, BILITOT, PROT, ALBUMIN in the last 168 hours. No results for input(s): LIPASE, AMYLASE in the last 168 hours. No results for input(s): AMMONIA in the last 168 hours. Coagulation Profile: Recent Labs  Lab 10/04/18 1324  INR 1.0   Cardiac Enzymes: No results for input(s): CKTOTAL, CKMB, CKMBINDEX, TROPONINI in the last 168 hours. BNP (last 3 results) No results for input(s): PROBNP in the last 8760 hours. HbA1C: No results for input(s): HGBA1C in the last 72 hours. CBG: No results for input(s): GLUCAP in the last 168 hours. Lipid Profile: No results for input(s): CHOL, HDL, LDLCALC, TRIG, CHOLHDL, LDLDIRECT in the last 72 hours. Thyroid Function Tests: No results for input(s): TSH, T4TOTAL, FREET4, T3FREE, THYROIDAB in the last 72 hours. Anemia Panel: No results for input(s): VITAMINB12, FOLATE, FERRITIN, TIBC, IRON, RETICCTPCT in the last 72 hours. Sepsis Labs: No results for input(s): PROCALCITON, LATICACIDVEN in the last 168 hours.  Recent Results (from the past 240 hour(s))  SARS Coronavirus 2 (CEPHEID - Performed in [place] hospital lab), Hosp Order     Status: None   Collection Time: 10/04/18  1:24 PM  Result Value Ref Range Status   SARS Coronavirus 2 NEGATIVE NEGATIVE Final    Comment: (NOTE) If result is NEGATIVE SARS-CoV-2 target nucleic acids are NOT DETECTED. The SARS-CoV-2 RNA is generally detectable in upper and lower  respiratory specimens during the acute phase of infection.  The lowest  concentration of SARS-CoV-2 viral copies this assay can detect is 250  copies / mL. A negative result does not preclude SARS-CoV-2 infection  and should not be used as the sole basis for treatment or other  patient management decisions.  A negative result may occur with  improper specimen collection / handling, submission of specimen other  than nasopharyngeal swab, presence of viral mutation(s) within the  areas targeted by this assay, and inadequate number of viral copies  (<250 copies / mL). A negative result must be combined with clinical  observations, patient history, and epidemiological information. If result is POSITIVE SARS-CoV-2 target nucleic acids are DETECTED. The SARS-CoV-2 RNA is generally detectable in upper and lower  respiratory specimens dur ing the acute phase of infection.  Positive  results are indicative of active infection with SARS-CoV-2.  Clinical  correlation with patient history and other diagnostic information is  necessary to determine patient infection status.  Positive results do  not rule out bacterial infection or co-infection with other viruses. If result is PRESUMPTIVE  POSTIVE SARS-CoV-2 nucleic acids MAY BE PRESENT.   A presumptive positive result was obtained on the submitted specimen  and confirmed on repeat testing.  While 2019 novel coronavirus  (SARS-CoV-2) nucleic acids may be present in the submitted sample  additional confirmatory testing may be necessary for epidemiological  and / or clinical management purposes  to differentiate between  SARS-CoV-2 and other Sarbecovirus currently known to infect humans.  If clinically indicated additional testing with an alternate test  methodology [id number]) is advised. The SARS-CoV-2 RNA is generally  detectable in upper and lower respiratory sp ecimens during the acute  phase of infection. The expected result is Negative. Fact Sheet for Patients:   StrictlyIdeas.no Fact Sheet for Healthcare Providers: BankingDealers.co.za This test is not yet approved or cleared by the [place] FDA and has been authorized for detection and/or diagnosis of SARS-CoV-2 by FDA under an Emergency Use Authorization (EUA).  This EUA will remain in effect (meaning this test can be used) for the duration of the COVID-19 declaration under Section 564(b)(1) of the Act, 21 U.S.C. section 360bbb-3(b)(1), unless the authorization is terminated or revoked sooner. Performed at [facility], [place] [address], [place], [place] [address]      Radiology Studies: Dg Chest 1 View  Result Date: 10/04/2018 CLINICAL DATA:  Pain status post fall EXAM: CHEST  1 VIEW COMPARISON:  07/18/2018 FINDINGS: The cardiac silhouette remains enlarged. There is a stable dual chamber left-sided pacemaker in place. There is no pneumothorax. No large pleural effusion. There are few calcified granulomas bilaterally. Old right-sided rib fractures are again seen. The patient is status post remote vertebral augmentation. Aortic calcifications are noted. IMPRESSION: No active disease. Electronically Signed   By: Constance Holster M.D.   On: 10/04/2018 14:28   Ct Head Wo Contrast  Result Date: 10/04/2018 CLINICAL DATA:  Fall onto right hip. EXAM: CT HEAD WITHOUT CONTRAST CT CERVICAL SPINE WITHOUT CONTRAST TECHNIQUE: Multidetector CT imaging of the head and cervical spine was performed following the standard protocol without intravenous contrast. Multiplanar CT image reconstructions of the cervical spine were also generated. COMPARISON:  CT head dated July 28, 2017. CT cervical spine dated December 31, 2016. FINDINGS: CT HEAD FINDINGS Brain: No evidence of acute infarction, hemorrhage, hydrocephalus, extra-axial collection or mass lesion/mass effect. Stable atrophy and chronic microvascular ischemic changes. Vascular: Calcified atherosclerosis at the  skullbase. No hyperdense vessel. Skull: Negative for fracture or focal lesion. Sinuses/Orbits: No acute finding. Other: None. CT CERVICAL SPINE FINDINGS Alignment: Normal. Skull base and vertebrae: No acute fracture. No primary bone lesion or focal pathologic process. Soft tissues and spinal canal: No prevertebral fluid or swelling. No visible canal hematoma. Disc levels: Mild-to-moderate multilevel disc height loss and uncovertebral hypertrophy, similar to prior study. Ankylosis of the left C3-C4 facet joint again noted. Upper chest: Atelectasis in the posterior right upper lobe. Other: Bilateral carotid artery calcific atherosclerosis. IMPRESSION: 1.  No acute intracranial abnormality. 2.  No acute cervical spine fracture. Electronically Signed   By: Titus Dubin M.D.   On: 10/04/2018 14:53   Ct Cervical Spine Wo Contrast  Result Date: 10/04/2018 CLINICAL DATA:  Fall onto right hip. EXAM: CT HEAD WITHOUT CONTRAST CT CERVICAL SPINE WITHOUT CONTRAST TECHNIQUE: Multidetector CT imaging of the head and cervical spine was performed following the standard protocol without intravenous contrast. Multiplanar CT image reconstructions of the cervical spine were also generated. COMPARISON:  CT head dated July 28, 2017. CT cervical spine dated December 31, 2016. FINDINGS: CT  HEAD FINDINGS Brain: No evidence of acute infarction, hemorrhage, hydrocephalus, extra-axial collection or mass lesion/mass effect. Stable atrophy and chronic microvascular ischemic changes. Vascular: Calcified atherosclerosis at the skullbase. No hyperdense vessel. Skull: Negative for fracture or focal lesion. Sinuses/Orbits: No acute finding. Other: None. CT CERVICAL SPINE FINDINGS Alignment: Normal. Skull base and vertebrae: No acute fracture. No primary bone lesion or focal pathologic process. Soft tissues and spinal canal: No prevertebral fluid or swelling. No visible canal hematoma. Disc levels: Mild-to-moderate multilevel disc height loss and  uncovertebral hypertrophy, similar to prior study. Ankylosis of the left C3-C4 facet joint again noted. Upper chest: Atelectasis in the posterior right upper lobe. Other: Bilateral carotid artery calcific atherosclerosis. IMPRESSION: 1.  No acute intracranial abnormality. 2.  No acute cervical spine fracture. Electronically Signed   By: Titus Dubin M.D.   On: 10/04/2018 14:53   Dg Knee Complete 4 Views Right  Result Date: 10/04/2018 CLINICAL DATA:  Fall EXAM: RIGHT KNEE - COMPLETE 4+ VIEW COMPARISON:  10/04/2018 FINDINGS: Vascular calcifications. No acute displaced fracture or malalignment. No significant knee effusion. Mild patellofemoral degenerative change. IMPRESSION: No acute osseous abnormality Electronically Signed   By: Donavan Foil M.D.   On: 10/04/2018 16:46   Dg C-arm 1-60 Min  Result Date: 10/04/2018 CLINICAL DATA:  IM nail right hip EXAM: DG C-ARM 61-120 MIN; OPERATIVE RIGHT HIP WITH PELVIS COMPARISON:  10/04/2018 FINDINGS: Four low resolution intraoperative spot views of the right hip. Total fluoroscopy time was 39 seconds. Images demonstrate intramedullary rod and screw fixation of the right femur across intertrochanteric fracture. IMPRESSION: Intraoperative fluoroscopic assistance provided during surgical fixation of right femur fracture Electronically Signed   By: Donavan Foil M.D.   On: 10/04/2018 20:35   Dg Hip Operative Unilat W Or W/o Pelvis Right  Result Date: 10/04/2018 CLINICAL DATA:  IM nail right hip EXAM: DG C-ARM 61-120 MIN; OPERATIVE RIGHT HIP WITH PELVIS COMPARISON:  10/04/2018 FINDINGS: Four low resolution intraoperative spot views of the right hip. Total fluoroscopy time was 39 seconds. Images demonstrate intramedullary rod and screw fixation of the right femur across intertrochanteric fracture. IMPRESSION: Intraoperative fluoroscopic assistance provided during surgical fixation of right femur fracture Electronically Signed   By: Donavan Foil M.D.   On: 10/04/2018  20:35   Dg Hip Unilat W Or Wo Pelvis 2-3 Views Right  Result Date: 10/04/2018 CLINICAL DATA:  Fall with hip pain EXAM: DG HIP (WITH OR WITHOUT PELVIS) 2-3V RIGHT COMPARISON:  07/28/2017 FINDINGS: There is an acute displaced comminuted intratrochanteric fracture of the proximal right femur. There is no dislocation. There is diffuse osteopenia. There are degenerative changes of the left hip. Vascular calcifications are noted. IMPRESSION: Acute comminuted intratrochanteric fracture of the proximal right femur. Diffuse osteopenia is noted. Electronically Signed   By: Constance Holster M.D.   On: 10/04/2018 14:27    Scheduled Meds: . acetaminophen  1,000 mg Oral Q6H  . amiodarone  200 mg Oral Daily  . aspirin EC  325 mg Oral Daily  . carvedilol  3.125 mg Oral BID WC  . Chlorhexidine Gluconate Cloth  6 each Topical Q0600  . docusate sodium  100 mg Oral BID  . [START ON 10/06/2018] doxercalciferol  5 mcg Intravenous Q T,Th,Sa-HD  . gabapentin  300 mg Oral TID  . isosorbide mononitrate  60 mg Oral Daily  . pravastatin  40 mg Oral Daily  . sevelamer carbonate  1,600 mg Oral With snacks  . sevelamer carbonate  2,400 mg Oral TID WC  Continuous Infusions: . methocarbamol (ROBAXIN) IV       LOS: 1 day   Marylu Lund, MD [facility] Pager On Amion  If 7PM-7AM, please contact night-coverage 10/05/2018, 2:19 PM

## 2018-10-05 NOTE — Progress Notes (Signed)
PT Cancellation Note  Patient Details Name: Leyana Whidden MRN: [id number] DOB: [birth year]   Cancelled Treatment:    Reason Eval/Treat Not Completed: Active bedrest order. Please discontinue to allow for PT evaluation.  Mabeline Caras, PT, DPT Acute Rehabilitation Services  Pager [phone] Office [place] 10/05/2018, 4:33 PM

## 2018-10-05 NOTE — Social Work (Signed)
CSW acknowledging consult for CIR placement. CIR placement is determined by PT/OT evaluation and further inpatient rehab consult. Will follow for therapy recommendations- if pt SNF appropriate and agreeable can arrange. Await PT/OT evals.   Westley Hummer, MSW, [place] Work [phone]

## 2018-10-05 NOTE — Consult Note (Addendum)
[facility] Renal Consultation Note    Indication for Consultation:  Management of ESRD/hemodialysis, anemia, hypertension/volume, and secondary hyperparathyroidism. PCP:  HPI: Valerie Terrell is a 85 y.o. female with ESRD, HTN, COPD, Hx sick sinus syndrome (s/p pacemaker), dHF, Hx SAH, Hx spinal compression Fx, and p-AF was admitted with R hip fracture s/p fall.  Pt reports was on her way to her dialysis clinic yesterday morning when she fell on her steps and landed on R side causing severe acute pain. EMS called and brough to ED where R hip fracture confirmed with imaging. Labs showed Na 142, K 3.8, Ca 8.4, WBC 8.6, Hgb 10.6. Ortho consulted and she was taken for surgery yesterday evening with intramedullary implant placement.  This morning, she is having R hip pain, otherwise ok. No CP/dyspnea. No fever, chills, nausea.  Dialyzes on TTS schedule at [facility]. Missed HD yesterday d/t the above. She is being dialyzed now - no UF d/t low BP, otherwise tolerating well.   Past Medical History:  Diagnosis Date  . Anemia of chronic disease 2018   + anemia of CRI?  [name] Atrophy of left kidney    with absent blood flow by renal artery dopplers (Dr. Gwenlyn Found)  . 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 Chronic combined systolic and diastolic CHF (congestive heart failure) ([place])    a. 11/2016: echo showing EF of 35-40%, RV strain noted, mild MR and mild TR.   Echo showed much improved EF 12/2017 (55-60%)  . Chronic respiratory failure with hypoxia ([place]) 02/11/2017   Now on chronic O2 (intermittent as of summer 2019).    . Debilitated patient    WC dependent as of 2018  . ESRD on hemodialysis ([place]) 01/2017   T/Th/Sat schedule- [place].  Right basilic AV fistula 05/7508--CHE unable to successfully access this.  Dr. Donnetta Hutching to do an upper arm AV graft as of 10/2017..  . History of adenomatous polyp of colon   . History of kidney stones    passed  . History of  subarachnoid hemorrhage 10/2014   after syncope and while on xarelto  . HOH (hard of hearing)   . Hyperlipidemia   . Hypertension    Difficult to control, in the setting of one functioning kidney: pt was referred to nephrology by Dr. Gwenlyn Found 06/2015.  . Lumbar radiculopathy 2012  . Lumbar spinal stenosis 2019   facet injections only very short term relief.  L1 and L2 selective nerve root blocks helpful 04/2018.  . Lumbar spondylosis    MR 07/2016---no sign of spinal nerve compression or cord compression.  Pt set up with outpt ortho while admitted to hosp 07/2016.  . Malnourished ([place])   . Metatarsal fracture 06/10/2016   Nondisplaced, left 5th metatarsal--pt was referred to ortho  . Osteopenia 2014   T-score -2.1  . PAF (paroxysmal atrial fibrillation) (HCC)    Eliquis started after BRAO and CVA.  She was changed to warfarin 2018--INR/coumadin mgmt per HD/nephrol.  . Presence of permanent cardiac pacemaker   . Right rib fracture 12/2016   s/p fall  . Sick sinus syndrome ([place]) 11/2016   Dual chamber pacer insertion 2018 (Dr. Lovena Le)  . Stroke ([place]) 52/7782   cardioembolic (had CVA while on no anticoag)--"scattered subacute punctate infarcts: 1 in R parietal lobe and 2 in occipital cortex" on MRI br.  CT angio head/neck: aortic arch athero.  R ICA 20% stenosis, L ICA w/out any stenosis.  . Thoracic back  pain 01/2017; 01/2018   2018: Facet?  Dr. Ramos->steroid injection.  01/2018-->CT T spine to check for a new comp fx-->none found.  Selective nerve root inj in T spine helpful on R side.   . Thoracic compression fracture ([place]) 07/2016   T3.  T7 and T8-- T8 kyphoplasty during hosp admission 07/2016.  Neuro referred pt to pain mgmt for consideration of injection 09/2016.  I referred her to endo 08/2016 for consideration of calcitonin treatment.   Past Surgical History:  Procedure Laterality Date  . APPENDECTOMY  child  . AV FISTULA PLACEMENT Right 03/31/2017   Procedure: Right ARM Basilic vein  transposition;  Surgeon: Conrad [place], MD;  Location: [facility] OR;  Service: Vascular;  Laterality: Right;  . AV FISTULA PLACEMENT Right 12/06/2017   Procedure: INSERTION OF ARTERIOVENOUS (AV) GORE-TEX GRAFT ARM RIGHT UPPER EXTREMITY;  Surgeon: [name], MD;  Location: [facility];  Service: Vascular;  Laterality: Right;  . CARDIOVASCULAR STRESS TEST  01/31/2016   Stress myoview: NORMAL/Low risk.  EF 56%.  . [place]  . COLONOSCOPY  2015   + hx of adenomatous polyps.  Need digest health spec in [facility] records to see when pt due for next colonoscopy  . EYE SURGERY Bilateral    Catarct  . INSERTION OF DIALYSIS CATHETER Right 01/29/2017   Procedure: INSERTION OF DIALYSIS CATHETER- RIGHT INTERNAL JUGULAR;  Surgeon: [name], MD;  Location: [facility] OR;  Service: Vascular;  Laterality: Right;  . IR GENERIC HISTORICAL  07/24/2016   IR KYPHO THORACIC WITH BONE BIOPSY 07/24/2016 Luanne Bras, MD [facility]-INTERV RAD  . KYPHOPLASTY  07/27/2016   T8  . PACEMAKER IMPLANT N/A 12/03/2016   Procedure: Pacemaker Implant;  Surgeon: Evans Lance, MD;  Location: [facility];  Service: Cardiovascular;  Laterality: N/A;  . Renal artery dopplers  02/26/2016   Her right renal dimension was 11 cm pole to pole with mild to moderate right renal artery stenosis. A right renal aortic ratio was 3.22 suggesting less than a 50% stenosis.  . Right upper arm AV Gore-Tex graft  12/06/2017   Dr. Donnetta Hutching  . TONSILLECTOMY    . TRANSTHORACIC ECHOCARDIOGRAM  01/29/2016; 11/2016; 12/2017   2017: EF 55-60%, normal LV wall motion, grade I DD.  No cardiac source of emboli was seen.  2018: EF 35-40%, could not assess DD due to a-fib, pulm HTN noted. 12/2017 EF 55-60%, nl wall motion, grd II DD, mod MR and pulm regurg, increased pulm pressure (peak 52).   Family History  Problem Relation Age of Onset  . Cancer Mother   . Heart disease Father   . Early death Father   . Sudden Cardiac Death Neg Hx    Social History:   reports that she quit smoking about 46 years ago. She quit after 22.00 years of use. She has never used smokeless tobacco. She reports current alcohol use of about 1.0 standard drinks of alcohol per week. She reports that she does not use drugs.  ROS: As per HPI otherwise negative.  Physical Exam: Vitals:   10/05/18 0708 10/05/18 0730 10/05/18 0800 10/05/18 0830  BP: (!) 110/42 (!) 72/28 (!) 85/35 (!) 105/39  Pulse: 78 73 69 75  Resp:      Temp:      TempSrc:      SpO2:      Weight:      Height:         General: Frail woman, NAD. Using  nasal oxygen. Head: Normocephalic, atraumatic, sclera non-icteric, mucus membranes are moist. Neck: Supple without lymphadenopathy/masses. JVD not elevated. Lungs: Clear bilaterally to auscultation without wheezes, rales, or rhonchi.  Heart: RRR; 2/6 systolic murmur noted Abdomen: Soft, non-tender, non-distended with normoactive bowel sounds. Musculoskeletal:  Strength and tone appear normal for age. Lower extremities: No LE edema; two short bandaged incisions on R hip. Neuro: Alert and oriented X 3. Moves all extremities spontaneously. Psych:  Responds to questions appropriately with a normal affect. Dialysis Access: L AVG + bruit (cannulated)  Allergies  Allergen Reactions  . Clonidine Derivatives Palpitations and Other (See Comments)    VERY SEDATED  . Adhesive [Tape] Other (See Comments)    Tears skin up  . Codeine Nausea Only  . Nickel Rash  . Sulfa Antibiotics Other (See Comments)    Reaction unknown   Prior to Admission medications   Medication Sig Start Date End Date Taking? Authorizing Provider  acetaminophen (TYLENOL) 500 MG tablet Take 500-1,000 mg 3 (three) times daily as needed by mouth for moderate pain.    Yes [provider]  amiodarone (PACERONE) 200 MG tablet TAKE 1 TABLET BY MOUTH  DAILY Patient taking differently: Take 200 mg by mouth daily.  04/11/18  Yes Lorretta Harp, MD  b complex-vitamin c-folic acid  (NEPHRO-VITE) 0.8 MG TABS tablet Take 1 tablet by mouth daily. 09/07/18  Yes [provider]  carvedilol (COREG) 3.125 MG tablet TAKE 1 TABLET BY MOUTH TWO  TIMES DAILY Patient taking differently: Take 3.125 mg by mouth 2 (two) times daily with a meal.  06/27/18  Yes Lorretta Harp, MD  docusate sodium (COLACE) 100 MG capsule Take 100 mg by mouth daily.   Yes [provider]  HYDROcodone-acetaminophen (NORCO/VICODIN) 5-325 MG tablet Take 1 tablet by mouth every 6 (six) hours as needed for moderate pain (Back pain).   Yes [provider]  isosorbide mononitrate (IMDUR) 60 MG 24 hr tablet TAKE 1 TABLET BY MOUTH EVERY DAY Patient taking differently: Take 60 mg by mouth daily.  08/29/18  Yes McGowen, Adrian Blackwater, MD  Multiple Vitamins-Minerals (MULTIVITAMIN WITH MINERALS) tablet Take 1 tablet by mouth daily.   Yes [provider]  ondansetron (ZOFRAN) 4 MG tablet Take 1 tablet (4 mg total) by mouth every 8 (eight) hours as needed for nausea or vomiting. 06/03/18  Yes McGowen, Adrian Blackwater, MD  oxyCODONE (OXY IR/ROXICODONE) 5 MG immediate release tablet Take 5 mg by mouth every 4 (four) hours as needed for severe pain (Back ppain).   Yes [provider]  polyvinyl alcohol (LIQUIFILM TEARS) 1.4 % ophthalmic solution Place 1-2 drops into both eyes as needed for dry eyes.   Yes [provider]  pravastatin (PRAVACHOL) 40 MG tablet TAKE 1 TABLET BY MOUTH  EVERY EVENING Patient taking differently: Take 40 mg by mouth daily.  03/09/18  Yes McGowen, Adrian Blackwater, MD  sevelamer carbonate (RENVELA) 800 MG tablet Take 1,600-2,400 mg by mouth See admin instructions. 2400 mg with meals and 1600 with snack   Yes [provider]  vitamin C (ASCORBIC ACID) 500 MG tablet Take 500 mg by mouth daily.   Yes [provider]  Vitamin D, Ergocalciferol, (DRISDOL) 1.25 MG (50000 UT) CAPS capsule Take 1 capsule (50,000 Units total) by mouth every Thursday. 04/28/18  Yes  McGowen, Adrian Blackwater, MD   Current Facility-Administered Medications  Medication Dose Route Frequency Provider Last Rate Last Dose  . 0.9 %  sodium chloride infusion  100  mL Intravenous PRN Loren Racer, PA-C      . 0.9 %  sodium chloride infusion  100 mL Intravenous PRN Loren Racer, PA-C      . acetaminophen (TYLENOL) tablet 1,000 mg  1,000 mg Oral Q6H Nicholes Stairs, MD   1,000 mg at 10/05/18 0538  . acetaminophen (TYLENOL) tablet 325-650 mg  325-650 mg Oral Q6H PRN Nicholes Stairs, MD   650 mg at 10/05/18 6195  . amiodarone (PACERONE) tablet 200 mg  200 mg Oral Daily Nicholes Stairs, MD      . aspirin EC tablet 325 mg  325 mg Oral Daily Nicholes Stairs, MD      . bisacodyl (DULCOLAX) EC tablet 5 mg  5 mg Oral Daily PRN Nicholes Stairs, MD      . calcium carbonate (dosed in mg elemental calcium) suspension 500 mg of elemental calcium  500 mg of elemental calcium Oral Q6H PRN Nicholes Stairs, MD      . camphor-menthol [facility]) lotion 1 application  1 application Topical K9T PRN Nicholes Stairs, MD       And  . hydrOXYzine (ATARAX/VISTARIL) tablet 25 mg  25 mg Oral Q8H PRN Nicholes Stairs, MD      . carvedilol (COREG) tablet 3.125 mg  3.125 mg Oral BID WC Nicholes Stairs, MD      . Chlorhexidine Gluconate Cloth 2 % PADS 6 each  6 each Topical Q0600 Nicholes Stairs, MD      . docusate sodium (COLACE) capsule 100 mg  100 mg Oral BID Nicholes Stairs, MD   100 mg at 10/04/18 2215  . docusate sodium (ENEMEEZ) enema 283 mg  1 enema Rectal PRN Nicholes Stairs, MD      . feeding supplement (NEPRO CARB STEADY) liquid 237 mL  237 mL Oral TID PRN Nicholes Stairs, MD      . fentaNYL (SUBLIMAZE) injection 12.5-25 mcg  12.5-25 mcg Intravenous Q2H PRN Nicholes Stairs, MD      . gabapentin (NEURONTIN) capsule 300 mg  300 mg Oral TID Nicholes Stairs, MD   300 mg at 10/04/18 2215  . heparin injection 1,000 Units   1,000 Units Dialysis PRN Loren Racer, PA-C      . HYDROcodone-acetaminophen (NORCO/VICODIN) 5-325 MG per tablet 1-2 tablet  1-2 tablet Oral Q6H PRN Nicholes Stairs, MD   2 tablet at 10/04/18 1947  . HYDROmorphone (DILAUDID) injection 0.5-1 mg  0.5-1 mg Intravenous Q4H PRN Nicholes Stairs, MD   1 mg at 10/05/18 0834  . isosorbide mononitrate (IMDUR) 24 hr tablet 60 mg  60 mg Oral Daily Nicholes Stairs, MD      . lidocaine (PF) (XYLOCAINE) 1 % injection 5 mL  5 mL Intradermal PRN Loren Racer, PA-C      . lidocaine-prilocaine (EMLA) cream 1 application  1 application Topical PRN Loren Racer, PA-C      . methocarbamol (ROBAXIN) tablet 500 mg  500 mg Oral Q6H PRN Nicholes Stairs, MD   500 mg at 10/04/18 1947   Or  . methocarbamol (ROBAXIN) 500 mg in dextrose 5 % 50 mL IVPB  500 mg Intravenous Q6H PRN Nicholes Stairs, MD      . metoCLOPramide [facility] tablet 5-10 mg  5-10 mg Oral Q8H PRN Nicholes Stairs, MD       Or  . metoCLOPramide (REGLAN) injection 5-10 mg  5-10 mg  Intravenous Q8H PRN Nicholes Stairs, MD      . ondansetron [facility]) tablet 4 mg  4 mg Oral Q6H PRN Nicholes Stairs, MD       Or  . ondansetron [facility]) injection 4 mg  4 mg Intravenous Q6H PRN Nicholes Stairs, MD      . oxyCODONE (Oxy IR/ROXICODONE) immediate release tablet 10-15 mg  10-15 mg Oral Q4H PRN Nicholes Stairs, MD      . oxyCODONE (Oxy IR/ROXICODONE) immediate release tablet 5-10 mg  5-10 mg Oral Q4H PRN Nicholes Stairs, MD      . pentafluoroprop-tetrafluoroeth (GEBAUERS) aerosol 1 application  1 application Topical PRN Loren Racer, PA-C      . polyethylene glycol (MIRALAX / GLYCOLAX) packet 17 g  17 g Oral Daily PRN Nicholes Stairs, MD      . pravastatin (PRAVACHOL) tablet 40 mg  40 mg Oral Daily Nicholes Stairs, MD      . sevelamer carbonate (RENVELA) tablet 1,600 mg  1,600 mg Oral With snacks Karmen Bongo, MD      .  sevelamer carbonate (RENVELA) tablet 2,400 mg  2,400 mg Oral TID WC Nicholes Stairs, MD      . sorbitol 70 % solution 30 mL  30 mL Oral PRN Nicholes Stairs, MD      . zolpidem [facility]) tablet 5 mg  5 mg Oral QHS PRN Nicholes Stairs, MD       Labs: Basic Metabolic Panel: Recent Labs  Lab 10/04/18 1324 10/05/18 0346  NA 142 139  K 3.8 5.3*  CL 99 95*  CO2 27 26  GLUCOSE 115* 142*  BUN 23 32*  CREATININE 6.55* 7.23*  CALCIUM 8.4* 8.3*   CBC: Recent Labs  Lab 10/04/18 1324 10/05/18 0346  WBC 8.6 7.9  NEUTROABS 6.7  --   HGB 10.6* 8.8*  HCT 34.6* 28.2*  MCV 105.5* 103.7*  PLT 171 163   Studies/Results: Dg Chest 1 View  Result Date: 10/04/2018 CLINICAL DATA:  Pain status post fall EXAM: CHEST  1 VIEW COMPARISON:  07/18/2018 FINDINGS: The cardiac silhouette remains enlarged. There is a stable dual chamber left-sided pacemaker in place. There is no pneumothorax. No large pleural effusion. There are few calcified granulomas bilaterally. Old right-sided rib fractures are again seen. The patient is status post remote vertebral augmentation. Aortic calcifications are noted. IMPRESSION: No active disease. Electronically Signed   By: Constance Holster M.D.   On: 10/04/2018 14:28   Ct Head Wo Contrast  Result Date: 10/04/2018 CLINICAL DATA:  Fall onto right hip. EXAM: CT HEAD WITHOUT CONTRAST CT CERVICAL SPINE WITHOUT CONTRAST TECHNIQUE: Multidetector CT imaging of the head and cervical spine was performed following the standard protocol without intravenous contrast. Multiplanar CT image reconstructions of the cervical spine were also generated. COMPARISON:  CT head dated July 28, 2017. CT cervical spine dated December 31, 2016. FINDINGS: CT HEAD FINDINGS Brain: No evidence of acute infarction, hemorrhage, hydrocephalus, extra-axial collection or mass lesion/mass effect. Stable atrophy and chronic microvascular ischemic changes. Vascular: Calcified atherosclerosis at the  skullbase. No hyperdense vessel. Skull: Negative for fracture or focal lesion. Sinuses/Orbits: No acute finding. Other: None. CT CERVICAL SPINE FINDINGS Alignment: Normal. Skull base and vertebrae: No acute fracture. No primary bone lesion or focal pathologic process. Soft tissues and spinal canal: No prevertebral fluid or swelling. No visible canal hematoma. Disc levels: Mild-to-moderate multilevel disc height loss and uncovertebral hypertrophy, similar to prior study. Ankylosis  of the left C3-C4 facet joint again noted. Upper chest: Atelectasis in the posterior right upper lobe. Other: Bilateral carotid artery calcific atherosclerosis. IMPRESSION: 1.  No acute intracranial abnormality. 2.  No acute cervical spine fracture. Electronically Signed   By: Titus Dubin M.D.   On: 10/04/2018 14:53   Ct Cervical Spine Wo Contrast  Result Date: 10/04/2018 CLINICAL DATA:  Fall onto right hip. EXAM: CT HEAD WITHOUT CONTRAST CT CERVICAL SPINE WITHOUT CONTRAST TECHNIQUE: Multidetector CT imaging of the head and cervical spine was performed following the standard protocol without intravenous contrast. Multiplanar CT image reconstructions of the cervical spine were also generated. COMPARISON:  CT head dated July 28, 2017. CT cervical spine dated December 31, 2016. FINDINGS: CT HEAD FINDINGS Brain: No evidence of acute infarction, hemorrhage, hydrocephalus, extra-axial collection or mass lesion/mass effect. Stable atrophy and chronic microvascular ischemic changes. Vascular: Calcified atherosclerosis at the skullbase. No hyperdense vessel. Skull: Negative for fracture or focal lesion. Sinuses/Orbits: No acute finding. Other: None. CT CERVICAL SPINE FINDINGS Alignment: Normal. Skull base and vertebrae: No acute fracture. No primary bone lesion or focal pathologic process. Soft tissues and spinal canal: No prevertebral fluid or swelling. No visible canal hematoma. Disc levels: Mild-to-moderate multilevel disc height loss and  uncovertebral hypertrophy, similar to prior study. Ankylosis of the left C3-C4 facet joint again noted. Upper chest: Atelectasis in the posterior right upper lobe. Other: Bilateral carotid artery calcific atherosclerosis. IMPRESSION: 1.  No acute intracranial abnormality. 2.  No acute cervical spine fracture. Electronically Signed   By: Titus Dubin M.D.   On: 10/04/2018 14:53   Dg Knee Complete 4 Views Right  Result Date: 10/04/2018 CLINICAL DATA:  Fall EXAM: RIGHT KNEE - COMPLETE 4+ VIEW COMPARISON:  10/04/2018 FINDINGS: Vascular calcifications. No acute displaced fracture or malalignment. No significant knee effusion. Mild patellofemoral degenerative change. IMPRESSION: No acute osseous abnormality Electronically Signed   By: Donavan Foil M.D.   On: 10/04/2018 16:46   Dg C-arm 1-60 Min  Result Date: 10/04/2018 CLINICAL DATA:  IM nail right hip EXAM: DG C-ARM 61-120 MIN; OPERATIVE RIGHT HIP WITH PELVIS COMPARISON:  10/04/2018 FINDINGS: Four low resolution intraoperative spot views of the right hip. Total fluoroscopy time was 39 seconds. Images demonstrate intramedullary rod and screw fixation of the right femur across intertrochanteric fracture. IMPRESSION: Intraoperative fluoroscopic assistance provided during surgical fixation of right femur fracture Electronically Signed   By: Donavan Foil M.D.   On: 10/04/2018 20:35   Dg Hip Operative Unilat W Or W/o Pelvis Right  Result Date: 10/04/2018 CLINICAL DATA:  IM nail right hip EXAM: DG C-ARM 61-120 MIN; OPERATIVE RIGHT HIP WITH PELVIS COMPARISON:  10/04/2018 FINDINGS: Four low resolution intraoperative spot views of the right hip. Total fluoroscopy time was 39 seconds. Images demonstrate intramedullary rod and screw fixation of the right femur across intertrochanteric fracture. IMPRESSION: Intraoperative fluoroscopic assistance provided during surgical fixation of right femur fracture Electronically Signed   By: Donavan Foil M.D.   On: 10/04/2018  20:35   Dg Hip Unilat W Or Wo Pelvis 2-3 Views Right  Result Date: 10/04/2018 CLINICAL DATA:  Fall with hip pain EXAM: DG HIP (WITH OR WITHOUT PELVIS) 2-3V RIGHT COMPARISON:  07/28/2017 FINDINGS: There is an acute displaced comminuted intratrochanteric fracture of the proximal right femur. There is no dislocation. There is diffuse osteopenia. There are degenerative changes of the left hip. Vascular calcifications are noted. IMPRESSION: Acute comminuted intratrochanteric fracture of the proximal right femur. Diffuse osteopenia is noted. Electronically Signed  By: Constance Holster M.D.   On: 10/04/2018 14:27    Dialysis Orders:  TTS at [facility] 4hr, 350/600, EDW 53kg, 2K/2Ca, UFP #4, AVG, heparin 1400 - Hectoral 84mcg IV q HD - Mircera 100 q 2 weeks (last 5/12)  Assessment/Plan: 1.  R hip fracture s/p fall: Ortho consulted - s/p intramedullary implant 5/19. 2.  ESRD: Missed HD yesterday - HD now, no UF. Back to usual TTS schedule this week. No heparin d/t recent surgery. 3.  Hypertension/volume: BP low today, no UF. No edema. Follow. 4.  Anemia: Hgb down to 8.8 today post-op, not due for ESA yet. Follow, transfuse if < 7. 5.  Metabolic bone disease: Ca ok, Phos pending. Continue home meds (renvela)/hect. 6.  Hx pacemaker (sick sinus syndrome) 7.  HFpEF 8.  A-fib: On coreg/amiodarone.  [name], PA-C 10/05/2018, 8:59 AM  [facility] Pager: [phone]  Pt seen, examined and agree w A/P as above.  Kelly Splinter  MD 10/05/2018, 2:23 PM

## 2018-10-05 NOTE — Procedures (Signed)
   I was present at this dialysis session, have reviewed the session itself and made  appropriate changes Kelly Splinter MD [place] pager [phone]   10/05/2018, 2:24 PM

## 2018-10-05 NOTE — Telephone Encounter (Signed)
A message was left, re: follow up visit. 

## 2018-10-05 NOTE — Progress Notes (Signed)
Nutrition Brief Note  RD consulted for assessment due to hip fracture protocol.   Wt Readings from Last 15 Encounters:  10/05/18 58.9 kg  08/03/18 55.6 kg  07/27/18 57.4 kg  07/22/18 55.8 kg  07/06/18 54.4 kg  05/27/18 54.9 kg  05/22/18 55.3 kg  03/28/18 55.3 kg  02/06/18 52.5 kg  12/22/17 52.7 kg  12/20/17 53.7 kg  12/16/17 56.5 kg  12/10/17 54.9 kg  12/10/17 54.9 kg  12/03/17 55.3 kg   Valerie Terrell is a 85 y.o. female with medical history significant of CVA; rib and compression fractures; pacemaker; afib off AC due to falls; HTN; HLD; ESRD on TTS HD; chronic O2 dependence; and chronic combined CHF presenting with hip pain.  She was getting ready for HD and has 3 steps from the kitchen to the garage.  She didn't step well and lost her balance.  She landed on her right hip and experienced excruciating pain.   5/19- s/p PROCEDURE: Treatment of intertrochanteric, pertrochanteric, subtrochanteric fracture with intramedullary implant. CPT [phone]   Reviewed I/O's: +200 ml x 24 hours  Spoke with pt at bedside, who was very sleepy at time of visit. She reports good appetite PTA and denies weight loss. She is currently hungry and requesting something to eat (RN aware- tray has been ordered).   Nutrition-Focused physical exam completed. Findings are no fat depletion, no muscle depletion, and no edema.   Body mass index is 26.23 kg/m. Patient meets criteria for overweight based on current BMI.   Current diet order is renal with 1.2 L fluid restriction, patient is consuming approximately 75% of meals at this time. Labs and medications reviewed.   No nutrition interventions warranted at this time. If nutrition issues arise, please consult RD.   Valerie Terrell, RD, LDN, [place] Registered Dietitian II Certified Diabetes Care and Education Specialist Pager: [phone] After hours Pager: [phone]

## 2018-10-05 NOTE — Progress Notes (Signed)
Patient transported to dialysis. Report given.

## 2018-10-06 ENCOUNTER — Other Ambulatory Visit: Payer: Self-pay

## 2018-10-06 LAB — CBC
HCT: 19.7 % — ABNORMAL LOW (ref 36.0–46.0)
Hemoglobin: 6.1 g/dL — CL (ref 12.0–15.0)
MCH: 31.9 pg (ref 26.0–34.0)
MCHC: 31 g/dL (ref 30.0–36.0)
MCV: 103.1 fL — ABNORMAL HIGH (ref 80.0–100.0)
Platelets: 129 10*3/uL — ABNORMAL LOW (ref 150–400)
RBC: 1.91 MIL/uL — ABNORMAL LOW (ref 3.87–5.11)
RDW: 15 % (ref 11.5–15.5)
WBC: 11.6 10*3/uL — ABNORMAL HIGH (ref 4.0–10.5)
nRBC: 0 % (ref 0.0–0.2)

## 2018-10-06 LAB — HEMOGLOBIN AND HEMATOCRIT, BLOOD
HCT: 23.6 % — ABNORMAL LOW (ref 36.0–46.0)
Hemoglobin: 7.4 g/dL — ABNORMAL LOW (ref 12.0–15.0)

## 2018-10-06 LAB — PREPARE RBC (CROSSMATCH)

## 2018-10-06 MED ORDER — HYDROMORPHONE HCL 1 MG/ML IJ SOLN
INTRAMUSCULAR | Status: AC
  Filled 2018-10-06: qty 0.5

## 2018-10-06 MED ORDER — CHLORHEXIDINE GLUCONATE CLOTH 2 % EX PADS: 6.0000 | MEDICATED_PAD | Freq: Every day | CUTANEOUS | Status: DC

## 2018-10-06 MED ORDER — SODIUM CHLORIDE 0.9 % IV SOLN: 100.0000 mL | INTRAVENOUS | Status: DC | PRN

## 2018-10-06 MED ORDER — PENTAFLUOROPROP-TETRAFLUOROETH EX AERO: 1.0000 "application " | INHALATION_SPRAY | CUTANEOUS | Status: DC | PRN

## 2018-10-06 MED ORDER — LIDOCAINE-PRILOCAINE 2.5-2.5 % EX CREA: 1.0000 "application " | TOPICAL_CREAM | CUTANEOUS | Status: DC | PRN

## 2018-10-06 MED ORDER — ALTEPLASE 2 MG IJ SOLR: 2.0000 mg | Freq: Once | INTRAMUSCULAR | Status: DC | PRN

## 2018-10-06 MED ORDER — GABAPENTIN 100 MG PO CAPS
100.0000 mg | ORAL_CAPSULE | Freq: Two times a day (BID) | ORAL | Status: DC
  Administered 2018-10-06 – 2018-10-11 (×10): 100 mg via ORAL
  Filled 2018-10-06 (×11): qty 1

## 2018-10-06 MED ORDER — LIDOCAINE HCL (PF) 1 % IJ SOLN: 5.0000 mL | INTRAMUSCULAR | Status: DC | PRN

## 2018-10-06 MED ORDER — HEPARIN SODIUM (PORCINE) 1000 UNIT/ML DIALYSIS: 1000.0000 [IU] | INTRAMUSCULAR | Status: DC | PRN

## 2018-10-06 MED ORDER — SODIUM CHLORIDE 0.9% IV SOLUTION
Freq: Once | INTRAVENOUS | Status: AC
  Administered 2018-10-06: 06:00:00 via INTRAVENOUS

## 2018-10-06 NOTE — Progress Notes (Signed)
[facility] Progress Note  Subjective: having lots of pain. Getting prbc's now. No sob or CP.   Vitals:   10/06/18 0540 10/06/18 0612 10/06/18 0645 10/06/18 0957  BP: (!) 120/36 (!) 114/55 (!) 116/31 (!) 113/43  Pulse: 84 84 92 80  Resp: 15 18 18 14   Temp: 99.3 F (37.4 C) 99.5 F (37.5 C) 99.5 F (37.5 C) 99.9 F (37.7 C)  TempSrc: Oral Oral Oral Oral  SpO2: 100% 96% 94% 100%  Weight:      Height:        Inpatient medications: . amiodarone  200 mg Oral Daily  . carvedilol  3.125 mg Oral BID WC  . Chlorhexidine Gluconate Cloth  6 each Topical Q0600  . docusate sodium  100 mg Oral BID  . doxercalciferol  5 mcg Intravenous Q T,Th,Sa-HD  . gabapentin  100 mg Oral TID  . isosorbide mononitrate  60 mg Oral Daily  . pravastatin  40 mg Oral Daily  . sevelamer carbonate  1,600 mg Oral With snacks  . sevelamer carbonate  2,400 mg Oral TID WC   . methocarbamol (ROBAXIN) IV Stopped (10/06/18 0413)   acetaminophen, bisacodyl, calcium carbonate (dosed in mg elemental calcium), camphor-menthol **AND** hydrOXYzine, docusate sodium, feeding supplement (NEPRO CARB STEADY), fentaNYL (SUBLIMAZE) injection, HYDROcodone-acetaminophen, HYDROmorphone (DILAUDID) injection, methocarbamol **OR** methocarbamol (ROBAXIN) IV, metoCLOPramide **OR** metoCLOPramide (REGLAN) injection, ondansetron **OR** ondansetron (ZOFRAN) IV, oxyCODONE, oxyCODONE, polyethylene glycol, sorbitol, zolpidem    Exam: Alert, no distress  no jvd Chest no rales or wheezing Cor RRR no mrg Abd soft ntnd no ascites Ext 1-2+ pretib edema bilat, R hip edema and dressings in place NF, ox3 L AVG+bruit  TTS [facility]  4h  350/600   53kg   P4   AVG  Hep 1400 - Hectoral 46mcg IV q HD - Mircera 100 q 2 weeks (last 5/12)  Assessment/Plan: 1.  R hip fracture s/p fall: Ortho consulted - s/p intramedullary implant 5/19. 2.  ESRD: HD TTS.  Had HD yest. HD today to get back on schedule. No heparin d/t recent  surgery. 3.  Hypertension/volume: vol overloaded, up 4-5kg w/ +LE edema. Max UF on HD today 4.  Anemia: Hgb 10 > 8 > 6 postop. Getting prbc's this am. Transfuse if < 7.  Next esa on 4/58.  5.  Metabolic bone disease: Ca ok, Phos pending. Continue home meds (renvela)/hect. 6.  Hx pacemaker (sick sinus syndrome) 7.  HFpEF 8.  A-fib: On coreg/amiodarone.   Kelly Splinter MD 10/06/2018, 11:02 AM     Iron/TIBC/Ferritin/ %Sat    Component Value Date/Time   IRON 60 06/23/2018   TIBC 193 06/23/2018   FERRITIN 516 06/10/2017   IRONPCTSAT 44 (H) 01/14/2017 1150   Recent Labs  Lab 10/04/18 1324 10/05/18 0346  NA 142 139  K 3.8 5.3*  CL 99 95*  CO2 27 26  GLUCOSE 115* 142*  BUN 23 32*  CREATININE 6.55* 7.23*  CALCIUM 8.4* 8.3*  INR 1.0  --    No results for input(s): AST, ALT, ALKPHOS, BILITOT, PROT in the last 168 hours. Recent Labs  Lab 10/06/18 0225  WBC 11.6*  HGB 6.1*  HCT 19.7*  PLT 129*

## 2018-10-06 NOTE — Progress Notes (Signed)
PT Cancellation Note  Patient Details Name: Valerie Terrell MRN: [id number] DOB: 1932-08-31   Cancelled Treatment:    Reason Eval/Treat Not Completed: Active bedrest order. Please discontinue so PT can proceed with out of bed mobility evaluation.  Mabeline Caras, PT, DPT Acute Rehabilitation Services  Pager [phone] Office [place] 10/06/2018, 7:34 AM

## 2018-10-06 NOTE — Social Work (Signed)
CSW acknowledging consult for SNF placement. Went by pt room, pt in dialysis, will f/u when she is back on floor.   Westley Hummer, MSW, [place] Work [phone]

## 2018-10-06 NOTE — Plan of Care (Signed)

## 2018-10-06 NOTE — Progress Notes (Addendum)
CRITICAL VALUE ALERT  Critical Value:  Hgb=6.1  Date & Time Notied:  10/06/2018; 0324  Provider Notified: Tylene Fantasia  Orders Received/Actions taken: Type and Screen, 1 Unit PRBC transfusion and H & H with appropriate time after blood transfusion.

## 2018-10-06 NOTE — Evaluation (Signed)
Physical Therapy Evaluation Patient Details Name: Emmanuel Ercole MRN: [id number] DOB: [birth year] Today's Date: 10/06/2018   History of Present Illness  Pt is an 85 y.o. female admitted 10/04/18 after falling at home sustaining R hip fx. S/p R intertrochanteric IM nail 5/19. PMH includes ESRD (HD TTS), CHF, CVA, pacemaker, CHF, O2 dependence, compression fxs.    Clinical Impression  Pt presents with an overall decrease in functional mobility secondary to above. PTA, pt ambulatory with RW/SPC, lives with daughter. Today, pt requiring maxA for bed mobility and pivot to recliner. Limited by significant RLE pain with all movement. Pt oriented to self/situation, but intermittently confused and disoriented about time/situation; also limited by fatigue. Pt concerned she had a stroke with new onset shakiness and perception changes (RN/MD aware). Pt would benefit from continued acute PT services to maximize functional mobility and independence prior to d/c with SNF-level therapies.     Follow Up Recommendations SNF;Supervision/Assistance - 24 hour    Equipment Recommendations  (TBD next venue)    Recommendations for Other Services       Precautions / Restrictions Precautions Precautions: Fall Restrictions Weight Bearing Restrictions: Yes RLE Weight Bearing: Weight bearing as tolerated      Mobility  Bed Mobility Overal bed mobility: Needs Assistance Bed Mobility: Supine to Sit     Supine to sit: Max assist;HOB elevated     General bed mobility comments: MaxA to assist RLE to EOB and assist trunk elevation; limited by pain throughout  Transfers Overall transfer level: Needs assistance Equipment used: 1 person hand held assist Transfers: Squat Pivot Transfers     Squat pivot transfers: Max assist     General transfer comment: MaxA to assist squat pivot from bed to recliner; pt attempting to initiate steps but unable due to pain, able to assist minimally with UE  support  Ambulation/Gait             General Gait Details: Unable due to pain  Stairs            Wheelchair Mobility    Modified Rankin (Stroke Patients Only)       Balance Overall balance assessment: Needs assistance   Sitting balance-Leahy Scale: Poor Sitting balance - Comments: pt with significant lateral lean to L-side to offload painful R hip requiring frequent maxA to correct                                     Pertinent Vitals/Pain Pain Assessment: Faces Faces Pain Scale: Hurts whole lot Pain Location: RLE Pain Descriptors / Indicators: Grimacing;Guarding;Moaning Pain Intervention(s): Limited activity within patient's tolerance;Repositioned    Home Living Family/patient expects to be discharged to:: Skilled nursing facility Living Arrangements: Children Available Help at Discharge: Family;Available PRN/intermittently Type of Home: House Home Access: Ramped entrance     Home Layout: One level Home Equipment: Bedside commode;Walker - 2 wheels;Cane - single point;Walker - 4 wheels      Prior Function Level of Independence: Independent with assistive device(s)         Comments: Pt reports intermittent use of RW vs. SPC; multiple falls leading to admissions but pt has h/o refusing SNF recommendation     Hand Dominance   Dominant Hand: Right    Extremity/Trunk Assessment   Upper Extremity Assessment Upper Extremity Assessment: Generalized weakness    Lower Extremity Assessment Lower Extremity Assessment: Generalized weakness;RLE deficits/detail RLE Deficits / Details: <3/5 throughout, pain  with any muscle activation RLE: Unable to fully assess due to pain RLE Coordination: decreased fine motor;decreased gross motor    Cervical / Trunk Assessment Cervical / Trunk Assessment: Kyphotic  Communication   Communication: HOH(hearing aides)  Cognition Arousal/Alertness: Awake/alert Behavior During Therapy: Flat affect Overall  Cognitive Status: Impaired/Different from baseline Area of Impairment: Orientation;Attention;Memory;Following commands;Safety/judgement;Awareness;Problem solving                 Orientation Level: Disoriented to;Time Current Attention Level: Sustained Memory: Decreased short-term memory Following Commands: Follows one step commands with increased time Safety/Judgement: Decreased awareness of deficits Awareness: Intellectual Problem Solving: Slow processing;Decreased initiation;Difficulty sequencing;Requires verbal cues;Requires tactile cues General Comments: Pt able to state name and fall/surgery, but intermittently confused and disorented about hospital timeline despite multiple attempts to reorient. "Ive been up in this chair for hours... I need to get out of this bed" (despite having just gotten to chair with PT 5 minutes prior). Pt reports new onset shakiness and perception changes since admission, "I think I had a stroke."      General Comments General comments (skin integrity, edema, etc.): RN notified of pt's concern for stroke, noting new onset shakiness and perception changes, L facial droop (?) - MD came to assess, likely medically induced due to sedating medications and pt needing HD today    Exercises     Assessment/Plan    PT Assessment Patient needs continued PT services  PT Problem List Decreased strength;Decreased range of motion;Decreased activity tolerance;Decreased balance;Decreased mobility;Decreased cognition;Decreased knowledge of use of DME;Decreased knowledge of precautions;Pain       PT Treatment Interventions DME instruction;Gait training;Stair training;Functional mobility training;Therapeutic activities;Therapeutic exercise;Balance training;Patient/family education    PT Goals (Current goals can be found in the Care Plan section)  Acute Rehab PT Goals Patient Stated Goal: Get out of this bed; be able to eat without shaking PT Goal Formulation: With  patient Time For Goal Achievement: 10/20/18 Potential to Achieve Goals: Fair    Frequency Min 3X/week   Barriers to discharge        Co-evaluation               AM-PAC PT "6 Clicks" Mobility  Outcome Measure Help needed turning from your back to your side while in a flat bed without using bedrails?: A Lot Help needed moving from lying on your back to sitting on the side of a flat bed without using bedrails?: A Lot Help needed moving to and from a bed to a chair (including a wheelchair)?: A Lot Help needed standing up from a chair using your arms (e.g., wheelchair or bedside chair)?: Total Help needed to walk in hospital room?: Total Help needed climbing 3-5 steps with a railing? : Total 6 Click Score: 9    End of Session Equipment Utilized During Treatment: Gait belt Activity Tolerance: Patient limited by pain Patient left: in chair;with call bell/phone within reach;with chair alarm set Nurse Communication: Mobility status;Need for lift equipment PT Visit Diagnosis: Other abnormalities of gait and mobility (R26.89);Muscle weakness (generalized) (M62.81);Pain Pain - Right/Left: Right Pain - part of body: Hip    Time: 5465-6812 PT Time Calculation (min) (ACUTE ONLY): 40 min   Charges:   PT Evaluation $PT Eval Moderate Complexity: 1 Mod PT Treatments $Therapeutic Activity: 23-37 mins   Mabeline Caras, PT, DPT Acute Rehabilitation Services  Pager [phone] Office [place] 10/06/2018, 10:36 AM

## 2018-10-06 NOTE — Progress Notes (Signed)
Physical Therapy Treatment Patient Details Name: Valerie Terrell MRN: [id number] DOB: November 20, 1932 Today's Date: 10/06/2018    History of Present Illness Pt is an 85 y.o. female admitted 10/04/18 after falling at home sustaining R hip fx. S/p R intertrochanteric IM nail 5/19. PMH includes ESRD (HD TTS), CHF, CVA, pacemaker, CHF, O2 dependence, compression fxs.   PT Comments    Pt seen for additional session for transfer back to bed in preparation to transport to HD. RN present to observe transfer technique. Pt with increased lethargy and intermittent confused, requiring maxA(+2) to perform squat pivot from recliner to bed. Pt with decreased ability to maintain upright seated posture, likely combination of R hip pain and generalized weakness. On 4L O2 [place] during session (pt reports she does not wear O2 normally at home). Discussed recommendation for SNF-level therapies; pt agrees she cannot return home with current functional mobility deficits.   Follow Up Recommendations  SNF;Supervision/Assistance - 24 hour     Equipment Recommendations  (TBD next venue)    Recommendations for Other Services       Precautions / Restrictions Precautions Precautions: Fall Restrictions Weight Bearing Restrictions: Yes RLE Weight Bearing: Weight bearing as tolerated    Mobility  Bed Mobility Overal bed mobility: Needs Assistance Bed Mobility: Sit to Supine;Rolling Rolling: Max assist   Supine to sit: Max assist;HOB elevated Sit to supine: Max assist;+2 for physical assistance   General bed mobility comments: Return to supine with maxA+2 for trunk control and BLE support; pt moaning in pain throughout. MaxA to roll R/L for bed pad placement. Pt uncomfortable with pillow under R hip to offload, so pillow placed between knees  Transfers Overall transfer level: Needs assistance Equipment used: 1 person hand held assist Transfers: Squat Pivot Transfers     Squat pivot transfers: Max assist;+2  physical assistance     General transfer comment: MaxA to assist squat pivot from recliner to bed. Pt unable to sit up right to assist to sit at EOB, likely a combination of R hip pain with upright sitting and generalized weakness  Ambulation/Gait             General Gait Details: Unable due to pain   Stairs             Wheelchair Mobility    Modified Rankin (Stroke Patients Only)       Balance Overall balance assessment: Needs assistance   Sitting balance-Leahy Scale: Poor Sitting balance - Comments: pt with significant lateral lean to L-side to offload painful R hip requiring frequent maxA to correct                                    Cognition Arousal/Alertness: Lethargic;Suspect due to medications Behavior During Therapy: Flat affect Overall Cognitive Status: Impaired/Different from baseline Area of Impairment: Orientation;Attention;Memory;Following commands;Safety/judgement;Awareness;Problem solving                 Orientation Level: Disoriented to;Time;Situation Current Attention Level: Sustained Memory: Decreased short-term memory;Decreased recall of precautions Following Commands: Follows one step commands with increased time Safety/Judgement: Decreased awareness of deficits Awareness: Intellectual Problem Solving: Slow processing;Decreased initiation;Difficulty sequencing;Requires verbal cues;Requires tactile cues General Comments: "Where is my dinner?" "What are we doing?" Lethargic and confused, but still able to have coherent/appropriate conversation when discussing PLOF/home set-up/need for rehab      Exercises      General Comments General comments (skin integrity, edema, etc.):  Pt on 4L O2 [place]      Pertinent Vitals/Pain Pain Assessment: Faces Faces Pain Scale: Hurts whole lot Pain Location: RLE Pain Descriptors / Indicators: Grimacing;Guarding;Moaning Pain Intervention(s): Limited activity within patient's  tolerance;Repositioned    Home Living Family/patient expects to be discharged to:: Skilled nursing facility Living Arrangements: Children Available Help at Discharge: Family;Available PRN/intermittently Type of Home: House Home Access: Ramped entrance   Home Layout: One level Home Equipment: Bedside commode;Walker - 2 wheels;Cane - single point;Walker - 4 wheels      Prior Function Level of Independence: Independent with assistive device(s)      Comments: Pt reports intermittent use of RW vs. SPC; multiple falls leading to admissions but pt has h/o refusing SNF recommendation   PT Goals (current goals can now be found in the care plan section) Acute Rehab PT Goals Patient Stated Goal: Get out of this bed; be able to eat without shaking PT Goal Formulation: With patient Time For Goal Achievement: 10/20/18 Potential to Achieve Goals: Fair Progress towards PT goals: Not progressing toward goals - comment(Limited by pain/lethargy)    Frequency    Min 3X/week      PT Plan Current plan remains appropriate    Co-evaluation              AM-PAC PT "6 Clicks" Mobility   Outcome Measure  Help needed turning from your back to your side while in a flat bed without using bedrails?: A Lot Help needed moving from lying on your back to sitting on the side of a flat bed without using bedrails?: A Lot Help needed moving to and from a bed to a chair (including a wheelchair)?: Total Help needed standing up from a chair using your arms (e.g., wheelchair or bedside chair)?: Total Help needed to walk in hospital room?: Total Help needed climbing 3-5 steps with a railing? : Total 6 Click Score: 8    End of Session Equipment Utilized During Treatment: Gait belt Activity Tolerance: Patient limited by pain Patient left: in bed;with call bell/phone within reach;Other (comment)(with transport for HD) Nurse Communication: Mobility status;Need for lift equipment PT Visit Diagnosis: Other  abnormalities of gait and mobility (R26.89);Muscle weakness (generalized) (M62.81);Pain Pain - Right/Left: Right Pain - part of body: Hip     Time: 1100-1117 PT Time Calculation (min) (ACUTE ONLY): 17 min  Charges:  $Therapeutic Activity: 23-37 mins                    Mabeline Caras, PT, DPT Acute Rehabilitation Services  Pager [phone] Office [place] 10/06/2018, 11:53 AM

## 2018-10-06 NOTE — Consult Note (Addendum)
   [facility] CM Inpatient Consult   10/06/2018  Valerie Terrell 12/07/1932 [id number]    Patient screened for extreme high risk score[33%] for unplanned readmissionandhospitalization under her [employer] plan, and to check if potential [place] Management services are needed. Noted attempts by [facility] community care coordinator in distant past but was unable to engage patient.  Per chart review and history and physical dated 10/04/18 [name] is a 85 y.o. female with medical history significant of CVA; rib and compression fractures; pacemaker; afib off AC due to falls; HTN; HLD; ESRD on TTS HD; chronic O2 dependence; and chronic combined CHF. She was admitted on 10/04/18 after falling at home while getting ready for HD, missed a step and fell, sustaining R hip fx and s/p R intertrochanteric IM nailing 5/19.   Primary Care Provider is Dr. Shawnie Dapper with [facility] [place].  Current review of disposition per PT/ OT recommendation shows that patient likely transitioning to SNF (skilled nursing facility) level therapies and patient agrees she cannot return home with current functional mobility deficits.  If there are changes in disposition or needs for community follow-up, please place a [place] Management consult as appropriate.    For questions and additional information, please call:  Flois Mctague A. Raziyah Vanvleck, BSN, RN-BC [facility] Liaison Cell: [phone]

## 2018-10-06 NOTE — Progress Notes (Addendum)
Occupational Therapy Evaluation Patient Details Name: Valerie Terrell MRN: [id number] DOB: 18-Mar-1933 Today's Date: 10/06/2018    History of Present Illness Pt is an 85 y.o. female admitted 10/04/18 after falling at home sustaining R hip fx. S/p R intertrochanteric IM nail 5/19. PMH includes ESRD (HD TTS), CHF, CVA, pacemaker, CHF, O2 dependence, compression fxs.   Clinical Impression       Follow Up Recommendations  SNF    Equipment Recommendations  3 in 1 bedside commode    Recommendations for Other Services  Pt presents with description above. PTA pt PLOF mod I in ADLs and IADLs with the use of AE. Pt currently lives at home with her daughter, which daughter provides assistance as needed. Pt currently Max A in functional mobility, LB ADLs due to pain and limited mobility. Pt will benefit from acute OT to transition to skilled OT in SNF setting to prepare for a safe return in home environment. DC to SNF. OT will continue follow acutely.    Precautions / Restrictions Precautions Precautions: Fall Restrictions Weight Bearing Restrictions: Yes RLE Weight Bearing: Weight bearing as tolerated      Mobility Bed Mobility Overal bed mobility: Needs Assistance Bed Mobility: Supine to Sit     Supine to sit: Max assist;HOB elevated     General bed mobility comments: Pt received in chair and set to transfer to bed for Dialysis.  Transfers Overall transfer level: Needs assistance Equipment used: 2 person hand held assist Transfers: Squat Pivot Transfers     Squat pivot transfers: Max assist     General transfer comment: MaxA to assist squat pivot from recliner to bed. Pt unable to sit up right to assist to sit at EOB possibly due to pain or low core stability.    Balance Overall balance assessment: Needs assistance   Sitting balance-Leahy Scale: Poor Sitting balance - Comments: pt with significant lateral lean to L-side to offload painful R hip requiring frequent maxA to  correct                                   ADL either performed or assessed with clinical judgement   ADL Overall ADL's : Needs assistance/impaired Eating/Feeding: Set up;Sitting   Grooming: Set up;Sitting   Upper Body Bathing: Set up   Lower Body Bathing: Maximal assistance   Upper Body Dressing : Set up   Lower Body Dressing: Maximal assistance   Toilet Transfer: Maximal assistance;+2 for physical assistance;+2 for safety/equipment Toilet Transfer Details (indicate cue type and reason): simulated transfer from chair to EOB.          Functional mobility during ADLs: Maximal assistance;+2 for physical assistance;+2 for safety/equipment General ADL Comments: Pt agrees to additional rehab to improve strength and safety to return home.      Vision         Perception     Praxis      Pertinent Vitals/Pain Pain Assessment: Faces Faces Pain Scale: Hurts whole lot Pain Location: RLE Pain Descriptors / Indicators: Grimacing;Guarding;Moaning Pain Intervention(s): Limited activity within patient's tolerance;Monitored during session     Hand Dominance Right   Extremity/Trunk Assessment Upper Extremity Assessment Upper Extremity Assessment: Generalized weakness   Lower Extremity Assessment Lower Extremity Assessment: Defer to PT evaluation RLE Deficits / Details: <3/5 throughout, pain with any muscle activation RLE: Unable to fully assess due to pain RLE Coordination: decreased fine motor;decreased gross motor   Cervical /  Trunk Assessment Cervical / Trunk Assessment: Kyphotic   Communication Communication Communication: HOH(hearing aides)   Cognition Arousal/Alertness: Awake/alert Behavior During Therapy: Flat affect Overall Cognitive Status: Impaired/Different from baseline Area of Impairment: Orientation;Attention;Memory;Following commands;Safety/judgement;Awareness;Problem solving                 Orientation Level: Disoriented  to;Time Current Attention Level: Sustained Memory: Decreased short-term memory Following Commands: Follows one step commands with increased time Safety/Judgement: Decreased awareness of deficits Awareness: Intellectual Problem Solving: Slow processing;Decreased initiation;Difficulty sequencing;Requires verbal cues;Requires tactile cues General Comments: Pt able to state name and fall/surgery, but intermittently confused and disorented about hospital timeline despite multiple attempts to reorient. "Ive been up in this chair for hours... I need to get out of this bed" (despite having just gotten to chair with PT 5 minutes prior). Pt reports new onset shakiness and perception changes since admission, "I think I had a stroke."   General Comments  PT reports new onset stroke, deficits with perceptions, tremors, and speech..    Exercises     Shoulder Instructions      Home Living Family/patient expects to be discharged to:: Skilled nursing facility Living Arrangements: Children Available Help at Discharge: Family;Available PRN/intermittently Type of Home: House Home Access: Ramped entrance     Home Layout: One level     Bathroom Shower/Tub: Teacher, early years/pre: Standard Bathroom Accessibility: Yes   Home Equipment: Bedside commode;Walker - 2 wheels;Cane - single point;Walker - 4 wheels          Prior Functioning/Environment Level of Independence: Independent with assistive device(s)        Comments: Pt reports intermittent use of RW vs. SPC; multiple falls leading to admissions but pt has h/o refusing SNF recommendation        OT Problem List: Impaired balance (sitting and/or standing);Decreased safety awareness;Pain      OT Treatment/Interventions: Self-care/ADL training;Therapeutic exercise;DME and/or AE instruction;Patient/family education    OT Goals(Current goals can be found in the care plan section) Acute Rehab OT Goals Patient Stated Goal: Get out  of this bed; be able to eat without shaking OT Goal Formulation: With patient Time For Goal Achievement: 10/20/18 Potential to Achieve Goals: Good  OT Frequency: Min 2X/week   Barriers to D/C:            Co-evaluation              AM-PAC OT "6 Clicks" Daily Activity     Outcome Measure Help from another person eating meals?: A Little Help from another person taking care of personal grooming?: A Little Help from another person toileting, which includes using toliet, bedpan, or urinal?: A Lot Help from another person bathing (including washing, rinsing, drying)?: A Lot Help from another person to put on and taking off regular upper body clothing?: A Little Help from another person to put on and taking off regular lower body clothing?: Total 6 Click Score: 14   End of Session Equipment Utilized During Treatment: Oxygen Nurse Communication: Mobility status;Need for lift equipment  Activity Tolerance: Patient limited by pain Patient left: in bed;with call bell/phone within reach;with nursing/sitter in room  OT Visit Diagnosis: Unsteadiness on feet (R26.81);Pain Pain - Right/Left: Right                Time: 1100-1117 OT Time Calculation (min): 17 min Charges:  OT General Charges $OT Visit: 1 Visit OT Evaluation $OT Eval Moderate Complexity: 1 Mod OT Treatments $Self Care/Home Management :  Minus Breeding, MSOT, OTR/L  Supplemental Rehabilitation Services  [phone]  Marius Ditch 10/06/2018, 11:41 AM

## 2018-10-06 NOTE — NC FL2 (Addendum)
[place] LEVEL OF CARE SCREENING TOOL     IDENTIFICATION  Patient Name: Valerie Terrell Birthdate: 1932-07-14 Sex: female Admission Date (Current Location): 10/04/2018  [facility] and [place] Number:  Herbalist and Address:  [facility] [place] [address] [place], [place] [address]      Provider Number: [id number]  Attending Physician Name and Address:  Donne Hazel, MD  Relative Name and Phone Number:  Valerie Terrell; daughter; [phone]    Current Level of Care: Hospital Recommended Level of Care: [place] Prior Approval Number:    Date Approved/Denied:   PASRR Number: [id number]  Discharge Plan: SNF    Current Diagnoses: Patient Active Problem List   Diagnosis Date Noted  . Hip fracture ([place]) 10/04/2018  . Fracture of pubic ramus ([place]) 07/19/2018  . Fracture of sacrum ([place]) 07/19/2018  . Perineal hematoma 07/19/2018  . Fever 07/19/2018  . Acute blood loss anemia 07/19/2018  . ESRD (end stage renal disease) on dialysis ([place]) 05/20/2018  . Chronic back pain 05/14/2018  . Chronic pain syndrome 05/14/2018  . Hypoxia 12/14/2017  . Dyspnea 12/14/2017  . Degeneration of thoracic intervertebral disc 07/26/2017  . Acute respiratory failure ([place]) 02/13/2017  . Hyperkalemia 02/13/2017  . Chronic respiratory failure with hypoxia ([place]) 02/11/2017  . Debilitated patient 02/07/2017  . Poor tolerance for ambulation 02/07/2017  . Fracture of one rib, right side, initial encounter for closed fracture 01/11/2017  . Closed fracture of one rib of right side   . Chronic combined systolic and diastolic heart failure ([place]) 01/05/2017  . Palpitations 12/15/2016  . Hypokalemia 12/14/2016  . Leukocytosis 12/14/2016  . Long term (current) use of anticoagulants [Z79.01] 12/11/2016  . Non-ischemic cardiomyopathy ([place]) 12/07/2016  . Acute combined systolic and diastolic heart failure ([place])  12/07/2016  . Cardiac  pacemaker   . Acute on chronic respiratory failure with hypoxia ([place])   . Acute pulmonary edema with congestive heart failure ([place]) 11/30/2016  . Elevated troponin level 11/30/2016  . Anemia 11/30/2016  . Chronic midline thoracic back pain 09/15/2016  . History of CVA (cerebrovascular accident) 09/15/2016  . Hyperlipidemia   . Nausea & vomiting 08/05/2016  . Dehydration 08/05/2016  . Osteoporosis 07/28/2016  . Closed compression fracture of thoracic vertebra ([place]) 07/23/2016  . Thoracic compression fracture ([place]) 07/22/2016  . Flank pain 07/11/2016  . Hypoalbuminemia 07/11/2016  . Renal artery stenosis ([place]) 06/24/2016  . Nondisplaced fracture of fifth metatarsal bone, left foot, initial encounter for closed fracture 06/10/2016  . Rib contusion, left, initial encounter 06/10/2016  . Single kidney 03/13/2016  . Chest pain   . Dyslipidemia   . PAF (paroxysmal atrial fibrillation) ([place]) 01/28/2016  . Essential hypertension 01/28/2016  . History of cardioembolic stroke 17/61/6073  . Personal history of subarachnoid hemorrhage 01/28/2016  . CKD (chronic kidney disease), stage IV ([place]) 01/28/2016  . Gait disturbance 01/27/2016  . Mitral valve disease 01/08/2014  . Colon polyps 07/10/2013  . Lumbar radiculopathy 02/04/2011    Orientation RESPIRATION BLADDER Height & Weight     Self, Time, Situation, Place  O2(3L nasal canula) Continent Weight: 132 lb 15 oz (60.3 kg) Height:  4\' 11"  (149.9 cm)  BEHAVIORAL SYMPTOMS/MOOD NEUROLOGICAL BOWEL NUTRITION STATUS      Continent Diet(Renal diet; 1200 mL Fluid Restrictions; Thin Fluids)  AMBULATORY STATUS COMMUNICATION OF NEEDS Skin   Extensive Assist Verbally Surgical wounds, Other (Comment)(incision on right hip with adhesive bandage; skin tear on left elbow)  Personal Care Assistance Level of Assistance  Bathing, Feeding, Dressing Bathing Assistance: Maximum assistance Feeding assistance: Independent Dressing  Assistance: Maximum assistance     Functional Limitations Info  Sight, Hearing, Speech Sight Info: Adequate Hearing Info: Impaired Speech Info: Adequate    SPECIAL CARE FACTORS FREQUENCY  PT (By licensed PT), OT (By licensed OT)     PT Frequency: 5x week OT Frequency: 5x week            Contractures Contractures Info: Not present    Additional Factors Info  Code Status, Allergies Code Status Info: Full Code Allergies Info: CLONIDINE DERIVATIVES, ADHESIVE TAPE, CODEINE, NICKEL, SULFA ANTIBIOTICS            Current Medications (10/06/2018):  This is the current hospital active medication list Current Facility-Administered Medications  Medication Dose Route Frequency Provider Last Rate Last Dose  . 0.9 %  sodium chloride infusion  100 mL Intravenous PRN Roney Jaffe, MD      . 0.9 %  sodium chloride infusion  100 mL Intravenous PRN Roney Jaffe, MD      . acetaminophen (TYLENOL) tablet 325-650 mg  325-650 mg Oral Q6H PRN Nicholes Stairs, MD   650 mg at 10/06/18 1020  . alteplase (CATHFLO ACTIVASE) injection 2 mg  2 mg Intracatheter Once PRN Roney Jaffe, MD      . amiodarone (PACERONE) tablet 200 mg  200 mg Oral Daily Nicholes Stairs, MD      . bisacodyl (DULCOLAX) EC tablet 5 mg  5 mg Oral Daily PRN Nicholes Stairs, MD      . calcium carbonate (dosed in mg elemental calcium) suspension 500 mg of elemental calcium  500 mg of elemental calcium Oral Q6H PRN Nicholes Stairs, MD      . camphor-menthol [facility]) lotion 1 application  1 application Topical W1X PRN Nicholes Stairs, MD       And  . hydrOXYzine (ATARAX/VISTARIL) tablet 25 mg  25 mg Oral Q8H PRN Nicholes Stairs, MD      . carvedilol (COREG) tablet 3.125 mg  3.125 mg Oral BID WC Nicholes Stairs, MD   3.125 mg at 10/05/18 1700  . Chlorhexidine Gluconate Cloth 2 % PADS 6 each  6 each Topical Q0600 Nicholes Stairs, MD   6 each at 10/06/18 [phone]  . Chlorhexidine  Gluconate Cloth 2 % PADS 6 each  6 each Topical Q0600 Roney Jaffe, MD      . docusate sodium (COLACE) capsule 100 mg  100 mg Oral BID Nicholes Stairs, MD   100 mg at 10/05/18 2215  . docusate sodium (ENEMEEZ) enema 283 mg  1 enema Rectal PRN Nicholes Stairs, MD      . doxercalciferol (HECTOROL) injection 5 mcg  5 mcg Intravenous Q T,Th,Sa-HD Loren Racer, PA-C      . feeding supplement (NEPRO CARB STEADY) liquid 237 mL  237 mL Oral TID PRN Nicholes Stairs, MD      . fentaNYL (SUBLIMAZE) injection 12.5-25 mcg  12.5-25 mcg Intravenous Q2H PRN Nicholes Stairs, MD      . gabapentin (NEURONTIN) capsule 100 mg  100 mg Oral BID Donne Hazel, MD      . heparin injection 1,000 Units  1,000 Units Dialysis PRN Roney Jaffe, MD      . HYDROmorphone (DILAUDID) injection 0.5-1 mg  0.5-1 mg Intravenous Q4H PRN Nicholes Stairs, MD   1 mg at 10/05/18 8295  . isosorbide  mononitrate (IMDUR) 24 hr tablet 60 mg  60 mg Oral Daily Nicholes Stairs, MD      . lidocaine (PF) (XYLOCAINE) 1 % injection 5 mL  5 mL Intradermal PRN Roney Jaffe, MD      . lidocaine-prilocaine (EMLA) cream 1 application  1 application Topical PRN Roney Jaffe, MD      . methocarbamol (ROBAXIN) tablet 500 mg  500 mg Oral Q6H PRN Nicholes Stairs, MD   500 mg at 10/04/18 1947   Or  . methocarbamol (ROBAXIN) 500 mg in dextrose 5 % 50 mL IVPB  500 mg Intravenous Q6H PRN Nicholes Stairs, MD   Stopped at 10/06/18 0413  . metoCLOPramide (REGLAN) tablet 5-10 mg  5-10 mg Oral Q8H PRN Nicholes Stairs, MD       Or  . metoCLOPramide [facility]) injection 5-10 mg  5-10 mg Intravenous Q8H PRN Nicholes Stairs, MD      . ondansetron [facility]) tablet 4 mg  4 mg Oral Q6H PRN Nicholes Stairs, MD       Or  . ondansetron [facility]) injection 4 mg  4 mg Intravenous Q6H PRN Nicholes Stairs, MD      . oxyCODONE (Oxy IR/ROXICODONE) immediate release tablet 10-15 mg  10-15 mg Oral  Q4H PRN Nicholes Stairs, MD      . oxyCODONE (Oxy IR/ROXICODONE) immediate release tablet 5-10 mg  5-10 mg Oral Q4H PRN Nicholes Stairs, MD      . pentafluoroprop-tetrafluoroeth (GEBAUERS) aerosol 1 application  1 application Topical PRN Roney Jaffe, MD      . polyethylene glycol (MIRALAX / Floria Raveling) packet 17 g  17 g Oral Daily PRN Nicholes Stairs, MD      . pravastatin (PRAVACHOL) tablet 40 mg  40 mg Oral Daily Nicholes Stairs, MD      . sevelamer carbonate (RENVELA) tablet 1,600 mg  1,600 mg Oral With snacks Karmen Bongo, MD   1,600 mg at 10/05/18 2007  . sevelamer carbonate (RENVELA) tablet 2,400 mg  2,400 mg Oral TID WC Nicholes Stairs, MD   2,400 mg at 10/05/18 1826  . sorbitol 70 % solution 30 mL  30 mL Oral PRN Nicholes Stairs, MD      . zolpidem [facility]) tablet 5 mg  5 mg Oral QHS PRN Nicholes Stairs, MD         Discharge Medications: Please see discharge summary for a list of discharge medications.  Relevant Imaging Results:  Relevant Lab Results:   Additional Information SSN: [id number]; HD TTS at [facility]

## 2018-10-06 NOTE — TOC Initial Note (Signed)
Transition of Care [facility]) - Initial/Assessment Note    Patient Details  Name: Valerie Terrell MRN: [id number] Date of Birth: 1933/02/28  Transition of Care [facility]) CM/SW Contact:    Alexander [place], [place] Phone Number: 10/06/2018, 4:10 PM  Clinical Narrative:                 CSW spoke with pt but pt was too lethargic to answer most questions- gave permission to speak with daughter. Spoke with daughter Valerie Terrell via telephone [phone]). Pt from home with daughter who is primary caregiver.   Pt has been resistant to SNF and although pt daughter respects her decision she is feeling understandable caregiver burden (pt has broken both hip and her pelvis).   CSW and daughter discussed preferred referral- preference for [facility] who has had pt in the past- they have all needed equipment. Pt daughter will call and speak with pt regarding SNF vs HH as will CSW when she is more alert.   Continuing to follow.  Expected Discharge Plan: Skilled Nursing Facility Barriers to Discharge: Continued Medical Work up   Patient Goals and CMS Choice   CMS Medicare.gov Compare Post Acute Care list provided to:: Patient Choice offered to / list presented to : Patient  Expected Discharge Plan and Services Expected Discharge Plan: [place] In-house Referral: Clinical Social Work   Post Acute Care Choice: [place] Living arrangements for the past 2 months: [place]      Prior Living Arrangements/Services Living arrangements for the past 2 months: Single Family Home Lives with:: Self Patient language and need for interpreter reviewed:: Yes(no needs) Do you feel safe going back to the place where you live?: Yes      Need for Family Participation in Patient Care: Yes (Comment) Care giver support system in place?: Yes (comment) Current home services: DME Criminal Activity/Legal Involvement Pertinent to Current Situation/Hospitalization: No - Comment as  needed  Activities of Daily Living      Permission Sought/Granted Permission sought to share information with : Facility Sport and exercise psychologist, Family Supports Permission granted to share information with : Yes, Verbal Permission Granted  Share Information with NAME: Valerie Terrell  Permission granted to share info w AGENCY: SNFs  Permission granted to share info w Relationship: daughter  Permission granted to share info w Contact Information: [phone]  Emotional Assessment Appearance:: Other (Comment Required(spoke with pt daughter on phone) Attitude/Demeanor/Rapport: Other (comment)(spoke with daughter on phone) Affect (typically observed): Unable to Assess Orientation: : Oriented to Place, Oriented to Self, Oriented to  Time, Oriented to Situation Alcohol / Substance Use: Alcohol Use Psych Involvement: No (comment)  Admission diagnosis:  Fall [W19.XXXA] Knee pain, right [M25.561] Closed fracture of right hip, initial encounter [facility]) [S72.001A] Patient Active Problem List   Diagnosis Date Noted  . Hip fracture ([place]) 10/04/2018  . Fracture of pubic ramus ([place]) 07/19/2018  . Fracture of sacrum ([place]) 07/19/2018  . Perineal hematoma 07/19/2018  . Fever 07/19/2018  . Acute blood loss anemia 07/19/2018  . ESRD (end stage renal disease) on dialysis ([place]) 05/20/2018  . Chronic back pain 05/14/2018  . Chronic pain syndrome 05/14/2018  . Hypoxia 12/14/2017  . Dyspnea 12/14/2017  . Degeneration of thoracic intervertebral disc 07/26/2017  . Acute respiratory failure ([place]) 02/13/2017  . Hyperkalemia 02/13/2017  . Chronic respiratory failure with hypoxia ([place]) 02/11/2017  . Debilitated patient 02/07/2017  . Poor tolerance for ambulation 02/07/2017  . Fracture of one rib, right side, initial encounter for closed  fracture 01/11/2017  . Closed fracture of one rib of right side   . Chronic combined systolic and diastolic heart failure ([place]) 01/05/2017  . Palpitations 12/15/2016   . Hypokalemia 12/14/2016  . Leukocytosis 12/14/2016  . Long term (current) use of anticoagulants [Z79.01] 12/11/2016  . Non-ischemic cardiomyopathy ([place]) 12/07/2016  . Acute combined systolic and diastolic heart failure ([place])  12/07/2016  . Cardiac pacemaker   . Acute on chronic respiratory failure with hypoxia ([place])   . Acute pulmonary edema with congestive heart failure ([place]) 11/30/2016  . Elevated troponin level 11/30/2016  . Anemia 11/30/2016  . Chronic midline thoracic back pain 09/15/2016  . History of CVA (cerebrovascular accident) 09/15/2016  . Hyperlipidemia   . Nausea & vomiting 08/05/2016  . Dehydration 08/05/2016  . Osteoporosis 07/28/2016  . Closed compression fracture of thoracic vertebra ([place]) 07/23/2016  . Thoracic compression fracture ([place]) 07/22/2016  . Flank pain 07/11/2016  . Hypoalbuminemia 07/11/2016  . Renal artery stenosis ([place]) 06/24/2016  . Nondisplaced fracture of fifth metatarsal bone, left foot, initial encounter for closed fracture 06/10/2016  . Rib contusion, left, initial encounter 06/10/2016  . Single kidney 03/13/2016  . Chest pain   . Dyslipidemia   . PAF (paroxysmal atrial fibrillation) ([place]) 01/28/2016  . Essential hypertension 01/28/2016  . History of cardioembolic stroke 11/26/1973  . Personal history of subarachnoid hemorrhage 01/28/2016  . CKD (chronic kidney disease), stage IV ([place]) 01/28/2016  . Gait disturbance 01/27/2016  . Mitral valve disease 01/08/2014  . Colon polyps 07/10/2013  . Lumbar radiculopathy 02/04/2011   PCP:  Tammi Sou, MD Pharmacy:   [id number] [place], [address] [place] [address] Phone: [phone] Fax: [phone]  [place], [place] [facility] [address] [place] [address] [name] [address] Phone: [phone] Fax: [phone]     Social Determinants of Health ([place]) Interventions     Readmission Risk Interventions Readmission Risk Prevention Plan 10/06/2018  Transportation Screening Complete  Medication Review ([place]) Complete  PCP or Specialist appointment within 3-5 days of discharge Complete  HRI or Home Care Consult Complete  SW Recovery Care/Counseling Consult Complete  Palliative Care Screening Not [place] Complete  Some recent data might be hidden

## 2018-10-06 NOTE — Progress Notes (Signed)
Patient and hemodialysis when attempted to see on rounds.  Will check back tomorrow.   Continue postoperative care.  Maintain postoperative dressings.  Okay for full weightbearing as tolerated to the right lower extremity.

## 2018-10-06 NOTE — Progress Notes (Signed)
PROGRESS NOTE    Valerie Terrell  DJT:[id number] DOB: [birth year] DOA: 10/04/2018 PCP: Tammi Sou, MD    Brief Narrative:  85 y.o. female with medical history significant of CVA; rib and compression fractures; pacemaker; afib off AC due to falls; HTN; HLD; ESRD on TTS HD; chronic O2 dependence; and chronic combined CHF presenting with hip pain.  She was getting ready for HD and has 3 steps from the kitchen to the garage.  She didn't step well and lost her balance.  She landed on her right hip and experienced excruciating pain.  Assessment & Plan:   Principal Problem:   Hip fracture ([place]) Active Problems:   PAF (paroxysmal atrial fibrillation) (HCC)   Essential hypertension   Cardiac pacemaker   Chronic combined systolic and diastolic heart failure (HCC)   ESRD (end stage renal disease) on dialysis [facility])   Chronic pain syndrome   Hip fracture -Mechanical fall resulting in hip fracture -Orthopedics consulted, s/p surgery 5/19 -Continue analgesic as needed -SW consult for rehab placement; -PT consulted with recommendation for SNF. SW consulted  Afib, not on AC -Rate controlled with Amiodarone, Coreg and has pacemaker -Reportedly no longer taking anticoagulation -Seems stable at present  HTN -Continue Coreg as tolerated -Noted to be hypotensive while on HD recently, BP stable at present  Chronic combined CHF -Appears to be compensated at this time -7/19 Echo with preserved EF and grade 2 diastolic dysfunction -Seems to be stable at this time  ESRD on HD -Patient on chronic TTS HD -Nephrology consulted -Did not tolerate recent HD secondary to pain and acute encephalopathy, see below -Currently undergoing HD  Chronic pain syndrome -Takes Vicodin and Percocet for pain at home -Patient seems more lethargic/encephalopathic this AM -Will reduce opioid dosing, d/c vicodin, decrease neurontin dose, see below  Acute toxic metabolic encephalopathy -Noted to be more  confused while on HD recently -per staff, mentation worsened after taking post-op analgesic -Still encephalopathic this AM. Decrease analgesics per above  Acute blood loss anemia -New finding -Hgb down to 6 range, improved with one unit PRBC -Repeat cbc in AM -Suspect blood loss secondary to recent surgery. Have stopped ASA for now  DVT prophylaxis: SCD's Code Status: Full Family Communication: Pt in room, family not at bedside Disposition Plan: Uncertain at this time  Consultants:   Orthopedic Surgery  Nephrology  Procedures:  Treatment of intertrochanteric, pertrochanteric, subtrochanteric fracture with intramedullary implant  Antimicrobials: Anti-infectives (From admission, onward)   Start     Dose/Rate Route Frequency Ordered Stop   10/04/18 1745  ceFAZolin (ANCEF) IVPB 2g/100 mL premix     2 g 200 mL/hr over 30 Minutes Intravenous On call to O.R. 10/04/18 1656 10/04/18 1805   10/04/18 1657  ceFAZolin (ANCEF) 2-4 GM/100ML-% IVPB    Note to Pharmacy:  Henrine Screws   : cabinet override      10/04/18 1657 10/04/18 1805      Subjective: Lethargic, arousable, seems confused  Objective: Vitals:   10/06/18 1130 10/06/18 1136 10/06/18 1200 10/06/18 1230  BP: 109/60 (!) 115/47 (!) 103/42 (!) 82/31  Pulse: 81 78 74 68  Resp: 18     Temp: 98.4 F (36.9 C)     TempSrc: Oral     SpO2: 98%     Weight: 60.3 kg     Height:        Intake/Output Summary (Last 24 hours) at 10/06/2018 1308 Last data filed at 10/06/2018 0945 Gross per 24 hour  Intake 850 ml  Output 0 ml  Net 850 ml   Filed Weights   10/05/18 0700 10/05/18 1011 10/06/18 1130  Weight: 58.9 kg 58.9 kg 60.3 kg    Examination: General exam: Asleep in chair, in nad Respiratory system: Normal respiratory effort, no wheezing Cardiovascular system: regular rate, s1, s2 Gastrointestinal system: Soft, nondistended, positive BS Central nervous system: CN2-12 grossly intact, strength intact Extremities:  Perfused, no clubbing Skin: Normal skin turgor, no notable skin lesions seen Psychiatry: difficult to assess given patient's encephalopathy  Data Reviewed: I have personally reviewed following labs and imaging studies  CBC: Recent Labs  Lab 10/04/18 1324 10/05/18 0346 10/06/18 0225 10/06/18 1147  WBC 8.6 7.9 11.6*  --   NEUTROABS 6.7  --   --   --   HGB 10.6* 8.8* 6.1* 7.4*  HCT 34.6* 28.2* 19.7* 23.6*  MCV 105.5* 103.7* 103.1*  --   PLT 171 163 129*  --    Basic Metabolic Panel: Recent Labs  Lab 10/04/18 1324 10/05/18 0346  NA 142 139  K 3.8 5.3*  CL 99 95*  CO2 27 26  GLUCOSE 115* 142*  BUN 23 32*  CREATININE 6.55* 7.23*  CALCIUM 8.4* 8.3*   GFR: Estimated Creatinine Clearance: 4.7 mL/min (A) (by C-G formula based on SCr of 7.23 mg/dL (H)). Liver Function Tests: No results for input(s): AST, ALT, ALKPHOS, BILITOT, PROT, ALBUMIN in the last 168 hours. No results for input(s): LIPASE, AMYLASE in the last 168 hours. No results for input(s): AMMONIA in the last 168 hours. Coagulation Profile: Recent Labs  Lab 10/04/18 1324  INR 1.0   Cardiac Enzymes: No results for input(s): CKTOTAL, CKMB, CKMBINDEX, TROPONINI in the last 168 hours. BNP (last 3 results) No results for input(s): PROBNP in the last 8760 hours. HbA1C: No results for input(s): HGBA1C in the last 72 hours. CBG: No results for input(s): GLUCAP in the last 168 hours. Lipid Profile: No results for input(s): CHOL, HDL, LDLCALC, TRIG, CHOLHDL, LDLDIRECT in the last 72 hours. Thyroid Function Tests: No results for input(s): TSH, T4TOTAL, FREET4, T3FREE, THYROIDAB in the last 72 hours. Anemia Panel: No results for input(s): VITAMINB12, FOLATE, FERRITIN, TIBC, IRON, RETICCTPCT in the last 72 hours. Sepsis Labs: No results for input(s): PROCALCITON, LATICACIDVEN in the last 168 hours.  Recent Results (from the past 240 hour(s))  SARS Coronavirus 2 (CEPHEID - Performed in [facility] hospital lab), Hosp  Order     Status: None   Collection Time: 10/04/18  1:24 PM  Result Value Ref Range Status   SARS Coronavirus 2 NEGATIVE NEGATIVE Final    Comment: (NOTE) If result is NEGATIVE SARS-CoV-2 target nucleic acids are NOT DETECTED. The SARS-CoV-2 RNA is generally detectable in upper and lower  respiratory specimens during the acute phase of infection. The lowest  concentration of SARS-CoV-2 viral copies this assay can detect is 250  copies / mL. A negative result does not preclude SARS-CoV-2 infection  and should not be used as the sole basis for treatment or other  patient management decisions.  A negative result may occur with  improper specimen collection / handling, submission of specimen other  than nasopharyngeal swab, presence of viral mutation(s) within the  areas targeted by this assay, and inadequate number of viral copies  (<250 copies / mL). A negative result must be combined with clinical  observations, patient history, and epidemiological information. If result is POSITIVE SARS-CoV-2 target nucleic acids are DETECTED. The SARS-CoV-2 RNA is generally detectable in upper and lower  respiratory specimens dur ing the acute phase of infection.  Positive  results are indicative of active infection with SARS-CoV-2.  Clinical  correlation with patient history and other diagnostic information is  necessary to determine patient infection status.  Positive results do  not rule out bacterial infection or co-infection with other viruses. If result is PRESUMPTIVE POSTIVE SARS-CoV-2 nucleic acids MAY BE PRESENT.   A presumptive positive result was obtained on the submitted specimen  and confirmed on repeat testing.  While 2019 novel coronavirus  (SARS-CoV-2) nucleic acids may be present in the submitted sample  additional confirmatory testing may be necessary for epidemiological  and / or clinical management purposes  to differentiate between  SARS-CoV-2 and other Sarbecovirus currently  known to infect humans.  If clinically indicated additional testing with an alternate test  methodology [phone]) is advised. The SARS-CoV-2 RNA is generally  detectable in upper and lower respiratory sp ecimens during the acute  phase of infection. The expected result is Negative. Fact Sheet for Patients:  StrictlyIdeas.no Fact Sheet for Healthcare Providers: BankingDealers.co.za This test is not yet approved or cleared by the [place] FDA and has been authorized for detection and/or diagnosis of SARS-CoV-2 by FDA under an Emergency Use Authorization (EUA).  This EUA will remain in effect (meaning this test can be used) for the duration of the COVID-19 declaration under Section 564(b)(1) of the Act, 21 U.S.C. section 360bbb-3(b)(1), unless the authorization is terminated or revoked sooner. Performed at [facility], [place] [address] [place], [place] [address]      Radiology Studies: Dg Chest 1 View  Result Date: 10/04/2018 CLINICAL DATA:  Pain status post fall EXAM: CHEST  1 VIEW COMPARISON:  07/18/2018 FINDINGS: The cardiac silhouette remains enlarged. There is a stable dual chamber left-sided pacemaker in place. There is no pneumothorax. No large pleural effusion. There are few calcified granulomas bilaterally. Old right-sided rib fractures are again seen. The patient is status post remote vertebral augmentation. Aortic calcifications are noted. IMPRESSION: No active disease. Electronically Signed   By: Constance Holster M.D.   On: 10/04/2018 14:28   Ct Head Wo Contrast  Result Date: 10/04/2018 CLINICAL DATA:  Fall onto right hip. EXAM: CT HEAD WITHOUT CONTRAST CT CERVICAL SPINE WITHOUT CONTRAST TECHNIQUE: Multidetector CT imaging of the head and cervical spine was performed following the standard protocol without intravenous contrast. Multiplanar CT image reconstructions of the cervical spine were also generated. COMPARISON:   CT head dated July 28, 2017. CT cervical spine dated December 31, 2016. FINDINGS: CT HEAD FINDINGS Brain: No evidence of acute infarction, hemorrhage, hydrocephalus, extra-axial collection or mass lesion/mass effect. Stable atrophy and chronic microvascular ischemic changes. Vascular: Calcified atherosclerosis at the skullbase. No hyperdense vessel. Skull: Negative for fracture or focal lesion. Sinuses/Orbits: No acute finding. Other: None. CT CERVICAL SPINE FINDINGS Alignment: Normal. Skull base and vertebrae: No acute fracture. No primary bone lesion or focal pathologic process. Soft tissues and spinal canal: No prevertebral fluid or swelling. No visible canal hematoma. Disc levels: Mild-to-moderate multilevel disc height loss and uncovertebral hypertrophy, similar to prior study. Ankylosis of the left C3-C4 facet joint again noted. Upper chest: Atelectasis in the posterior right upper lobe. Other: Bilateral carotid artery calcific atherosclerosis. IMPRESSION: 1.  No acute intracranial abnormality. 2.  No acute cervical spine fracture. Electronically Signed   By: Titus Dubin M.D.   On: 10/04/2018 14:53   Ct Cervical Spine Wo Contrast  Result Date: 10/04/2018 CLINICAL DATA:  Fall onto right  hip. EXAM: CT HEAD WITHOUT CONTRAST CT CERVICAL SPINE WITHOUT CONTRAST TECHNIQUE: Multidetector CT imaging of the head and cervical spine was performed following the standard protocol without intravenous contrast. Multiplanar CT image reconstructions of the cervical spine were also generated. COMPARISON:  CT head dated July 28, 2017. CT cervical spine dated December 31, 2016. FINDINGS: CT HEAD FINDINGS Brain: No evidence of acute infarction, hemorrhage, hydrocephalus, extra-axial collection or mass lesion/mass effect. Stable atrophy and chronic microvascular ischemic changes. Vascular: Calcified atherosclerosis at the skullbase. No hyperdense vessel. Skull: Negative for fracture or focal lesion. Sinuses/Orbits: No acute  finding. Other: None. CT CERVICAL SPINE FINDINGS Alignment: Normal. Skull base and vertebrae: No acute fracture. No primary bone lesion or focal pathologic process. Soft tissues and spinal canal: No prevertebral fluid or swelling. No visible canal hematoma. Disc levels: Mild-to-moderate multilevel disc height loss and uncovertebral hypertrophy, similar to prior study. Ankylosis of the left C3-C4 facet joint again noted. Upper chest: Atelectasis in the posterior right upper lobe. Other: Bilateral carotid artery calcific atherosclerosis. IMPRESSION: 1.  No acute intracranial abnormality. 2.  No acute cervical spine fracture. Electronically Signed   By: Titus Dubin M.D.   On: 10/04/2018 14:53   Dg Knee Complete 4 Views Right  Result Date: 10/04/2018 CLINICAL DATA:  Fall EXAM: RIGHT KNEE - COMPLETE 4+ VIEW COMPARISON:  10/04/2018 FINDINGS: Vascular calcifications. No acute displaced fracture or malalignment. No significant knee effusion. Mild patellofemoral degenerative change. IMPRESSION: No acute osseous abnormality Electronically Signed   By: Donavan Foil M.D.   On: 10/04/2018 16:46   Dg C-arm 1-60 Min  Result Date: 10/04/2018 CLINICAL DATA:  IM nail right hip EXAM: DG C-ARM 61-120 MIN; OPERATIVE RIGHT HIP WITH PELVIS COMPARISON:  10/04/2018 FINDINGS: Four low resolution intraoperative spot views of the right hip. Total fluoroscopy time was 39 seconds. Images demonstrate intramedullary rod and screw fixation of the right femur across intertrochanteric fracture. IMPRESSION: Intraoperative fluoroscopic assistance provided during surgical fixation of right femur fracture Electronically Signed   By: Donavan Foil M.D.   On: 10/04/2018 20:35   Dg Hip Operative Unilat W Or W/o Pelvis Right  Result Date: 10/04/2018 CLINICAL DATA:  IM nail right hip EXAM: DG C-ARM 61-120 MIN; OPERATIVE RIGHT HIP WITH PELVIS COMPARISON:  10/04/2018 FINDINGS: Four low resolution intraoperative spot views of the right hip.  Total fluoroscopy time was 39 seconds. Images demonstrate intramedullary rod and screw fixation of the right femur across intertrochanteric fracture. IMPRESSION: Intraoperative fluoroscopic assistance provided during surgical fixation of right femur fracture Electronically Signed   By: Donavan Foil M.D.   On: 10/04/2018 20:35   Dg Hip Unilat W Or Wo Pelvis 2-3 Views Right  Result Date: 10/04/2018 CLINICAL DATA:  Fall with hip pain EXAM: DG HIP (WITH OR WITHOUT PELVIS) 2-3V RIGHT COMPARISON:  07/28/2017 FINDINGS: There is an acute displaced comminuted intratrochanteric fracture of the proximal right femur. There is no dislocation. There is diffuse osteopenia. There are degenerative changes of the left hip. Vascular calcifications are noted. IMPRESSION: Acute comminuted intratrochanteric fracture of the proximal right femur. Diffuse osteopenia is noted. Electronically Signed   By: Constance Holster M.D.   On: 10/04/2018 14:27    Scheduled Meds: . amiodarone  200 mg Oral Daily  . carvedilol  3.125 mg Oral BID WC  . Chlorhexidine Gluconate Cloth  6 each Topical Q0600  . Chlorhexidine Gluconate Cloth  6 each Topical Q0600  . docusate sodium  100 mg Oral BID  . doxercalciferol  5 mcg  Intravenous Q T,Th,Sa-HD  . gabapentin  100 mg Oral BID  . isosorbide mononitrate  60 mg Oral Daily  . pravastatin  40 mg Oral Daily  . sevelamer carbonate  1,600 mg Oral With snacks  . sevelamer carbonate  2,400 mg Oral TID WC   Continuous Infusions: . sodium chloride    . sodium chloride    . methocarbamol (ROBAXIN) IV Stopped (10/06/18 0413)     LOS: 2 days   Marylu Lund, MD [facility] Pager On Amion  If 7PM-7AM, please contact night-coverage 10/06/2018, 1:08 PM

## 2018-10-07 LAB — TYPE AND SCREEN
ABO/RH(D): B POS
Antibody Screen: NEGATIVE
Unit division: 0

## 2018-10-07 LAB — BPAM RBC
Blood Product Expiration Date: 202005282359
ISSUE DATE / TIME: 202005210623
Unit Type and Rh: 7300

## 2018-10-07 MED ORDER — CHLORHEXIDINE GLUCONATE CLOTH 2 % EX PADS
6.0000 | MEDICATED_PAD | Freq: Every day | CUTANEOUS | Status: DC
  Administered 2018-10-07 – 2018-10-10 (×4): 6 via TOPICAL

## 2018-10-07 MED ORDER — NEPRO/CARBSTEADY PO LIQD
237.0000 mL | Freq: Two times a day (BID) | ORAL | Status: DC
  Administered 2018-10-07 – 2018-10-11 (×10): 237 mL via ORAL

## 2018-10-07 MED ORDER — RENA-VITE PO TABS
1.0000 | ORAL_TABLET | Freq: Every day | ORAL | Status: DC
  Administered 2018-10-07 – 2018-10-10 (×4): 1 via ORAL
  Filled 2018-10-07 (×4): qty 1

## 2018-10-07 MED ORDER — DARBEPOETIN ALFA 100 MCG/0.5ML IJ SOSY
100.0000 ug | PREFILLED_SYRINGE | INTRAMUSCULAR | Status: DC
  Filled 2018-10-07: qty 0.5

## 2018-10-07 MED ORDER — ASPIRIN 325 MG PO TABS
325.0000 mg | ORAL_TABLET | Freq: Every day | ORAL | Status: DC
  Administered 2018-10-07 – 2018-10-11 (×5): 325 mg via ORAL
  Filled 2018-10-07 (×5): qty 1

## 2018-10-07 NOTE — TOC Progression Note (Signed)
Transition of Care [facility]) - Progression Note    Patient Details  Name: Valerie Terrell MRN: [id number] Date of Birth: Jun 25, 1932  Transition of Care [facility]) CM/SW [place], [place] Phone Number: 10/07/2018, 3:31 PM  Clinical Narrative:    Referral tentatively given to [facility] at daughters request- they are unable to accept per Linna Hoff, liaison with [facility].   CSW spoke with pt at bedside, introduced self, role, reason for visit. Pt is hard of hearing but able to participate in conversation. CSW let pt know that while pt was lethargic I had spoken with her daughter Bryson Ha, she confirmed that they live together.   Pt still avoidant for SNF- when CSW pushed about how much assist she has at home pt states it is only her daughter that assists. We discussed that her max assist may not be feasible for her daughter to complete at home. Pt still avoidant about giving permission for SNF. She would like to work with therapies again today.   CSW also spoke with attending MD- pt will have to tolerate sitting for dialysis as no SNFs accept stretcher dialysis and if pt is to continue outpatient will need to be able to sit in chair.   CSW will continue to follow.   Expected Discharge Plan: [place] Barriers to Discharge: Continued Medical Work up  Expected Discharge Plan and Services Expected Discharge Plan: [place] In-house Referral: Clinical Social Work   Post Acute Care Choice: [place] Living arrangements for the past 2 months: [place] Determinants of Health (SDOH) Interventions    Readmission Risk Interventions Readmission Risk Prevention Plan 10/06/2018  Transportation Screening Complete  Medication Review Press photographer) Complete  PCP or Specialist appointment within 3-5 days of discharge Complete  HRI or Home Care Consult Complete  SW Recovery Care/Counseling Consult Complete  Palliative Care  Screening Not [place] Complete  Some recent data might be hidden

## 2018-10-07 NOTE — Progress Notes (Signed)
PROGRESS NOTE    Valerie Terrell  LKG:[id number] DOB: 1932-10-08 DOA: 10/04/2018 PCP: Tammi Sou, MD    Brief Narrative:  85 y.o. female with medical history significant of CVA; rib and compression fractures; pacemaker; afib off AC due to falls; HTN; HLD; ESRD on TTS HD; chronic O2 dependence; and chronic combined CHF presenting with hip pain.  She was getting ready for HD and has 3 steps from the kitchen to the garage.  She didn't step well and lost her balance.  She landed on her right hip and experienced excruciating pain.  Assessment & Plan:   Principal Problem:   Hip fracture ([place]) Active Problems:   PAF (paroxysmal atrial fibrillation) (HCC)   Essential hypertension   Cardiac pacemaker   Chronic combined systolic and diastolic heart failure (HCC)   ESRD (end stage renal disease) on dialysis [facility])   Chronic pain syndrome   Hip fracture -Mechanical fall resulting in hip fracture -Orthopedics following, s/p surgery 5/19 -Continue analgesic as needed -SW consult for rehab placement; -PT consulted with recommendation for SNF. Question if patient is agreeable to SNF vs home health. Will follow up with SW  Afib, not on AC -Rate controlled with Amiodarone, Coreg and has pacemaker -Reportedly no longer taking anticoagulation -Presently stable  HTN -Continue Coreg as tolerated -Noted to be hypotensive while on HD recently -BP stable at this time  Chronic combined CHF -Appears to be compensated at this time -7/19 Echo with preserved EF and grade 2 diastolic dysfunction -appears euvolemic at this time  ESRD on HD -Patient on chronic TTS HD -Nephrology consulted -Initially did not tolerate HD secondary to encephalopathy, now improved  Chronic pain syndrome -Takes Vicodin and Percocet for pain at home -Patient seems more lethargic/encephalopathic this AM -Have reduced opioid dosing, d/c vicodin, have decreased neurontin dose  Acute toxic metabolic  encephalopathy -Noted to be more confused while on HD recently -per staff, mentation worsened after taking post-op analgesic -More awake since reducing sedating meds, still tired appearing -Continue to wean sedating meds as tolerated  Acute blood loss anemia -New finding -Hgb down to 6 range, appropriately improved with one unit PRBC -Repeat cbc in AM -Suspect blood loss secondary to recent surgery. Have stopped ASA for now  DVT prophylaxis: SCD's Code Status: Full Family Communication: Pt in room, family not at bedside Disposition Plan: Uncertain at this time  Consultants:   Orthopedic Surgery  Nephrology  Procedures:  Treatment of intertrochanteric, pertrochanteric, subtrochanteric fracture with intramedullary implant  Antimicrobials: Anti-infectives (From admission, onward)   Start     Dose/Rate Route Frequency Ordered Stop   10/04/18 1745  ceFAZolin (ANCEF) IVPB 2g/100 mL premix     2 g 200 mL/hr over 30 Minutes Intravenous On call to O.R. 10/04/18 1656 10/04/18 1805   10/04/18 1657  ceFAZolin (ANCEF) 2-4 GM/100ML-% IVPB    Note to Pharmacy:  Henrine Screws   : cabinet override      10/04/18 1657 10/04/18 1805      Subjective: More awake, complaining of mild hip pain  Objective: Vitals:   10/06/18 2028 10/07/18 0352 10/07/18 0657 10/07/18 1021  BP: (!) 117/38 (!) 133/45 136/66 (!) 128/58  Pulse: 76 77  82  Resp:  15    Temp:  98.7 F (37.1 C)    TempSrc:  Oral    SpO2: 92% (!) 87% 94%   Weight:      Height:        Intake/Output Summary (Last 24 hours) at 10/07/2018  Elaine filed at 10/07/2018 2500 Gross per 24 hour  Intake 240 ml  Output 1640 ml  Net -1400 ml   Filed Weights   10/05/18 1011 10/06/18 1130 10/06/18 1506  Weight: 58.9 kg 60.3 kg 58.6 kg    Examination: General exam: Conversant, in no acute distress Respiratory system: normal chest rise, clear, no audible wheezing Cardiovascular system: regular rhythm, s1-s2 Gastrointestinal  system: Nondistended, nontender, pos BS Central nervous system: No seizures, no tremors, more awake Extremities: No cyanosis, no joint deformities Skin: No rashes, no pallor Psychiatry: Affect normal // no auditory hallucinations   Data Reviewed: I have personally reviewed following labs and imaging studies  CBC: Recent Labs  Lab 10/04/18 1324 10/05/18 0346 10/06/18 0225 10/06/18 1147  WBC 8.6 7.9 11.6*  --   NEUTROABS 6.7  --   --   --   HGB 10.6* 8.8* 6.1* 7.4*  HCT 34.6* 28.2* 19.7* 23.6*  MCV 105.5* 103.7* 103.1*  --   PLT 171 163 129*  --    Basic Metabolic Panel: Recent Labs  Lab 10/04/18 1324 10/05/18 0346  NA 142 139  K 3.8 5.3*  CL 99 95*  CO2 27 26  GLUCOSE 115* 142*  BUN 23 32*  CREATININE 6.55* 7.23*  CALCIUM 8.4* 8.3*   GFR: Estimated Creatinine Clearance: 4.6 mL/min (A) (by C-G formula based on SCr of 7.23 mg/dL (H)). Liver Function Tests: No results for input(s): AST, ALT, ALKPHOS, BILITOT, PROT, ALBUMIN in the last 168 hours. No results for input(s): LIPASE, AMYLASE in the last 168 hours. No results for input(s): AMMONIA in the last 168 hours. Coagulation Profile: Recent Labs  Lab 10/04/18 1324  INR 1.0   Cardiac Enzymes: No results for input(s): CKTOTAL, CKMB, CKMBINDEX, TROPONINI in the last 168 hours. BNP (last 3 results) No results for input(s): PROBNP in the last 8760 hours. HbA1C: No results for input(s): HGBA1C in the last 72 hours. CBG: No results for input(s): GLUCAP in the last 168 hours. Lipid Profile: No results for input(s): CHOL, HDL, LDLCALC, TRIG, CHOLHDL, LDLDIRECT in the last 72 hours. Thyroid Function Tests: No results for input(s): TSH, T4TOTAL, FREET4, T3FREE, THYROIDAB in the last 72 hours. Anemia Panel: No results for input(s): VITAMINB12, FOLATE, FERRITIN, TIBC, IRON, RETICCTPCT in the last 72 hours. Sepsis Labs: No results for input(s): PROCALCITON, LATICACIDVEN in the last 168 hours.  Recent Results (from the  past 240 hour(s))  SARS Coronavirus 2 (CEPHEID - Performed in [place] hospital lab), Hosp Order     Status: None   Collection Time: 10/04/18  1:24 PM  Result Value Ref Range Status   SARS Coronavirus 2 NEGATIVE NEGATIVE Final    Comment: (NOTE) If result is NEGATIVE SARS-CoV-2 target nucleic acids are NOT DETECTED. The SARS-CoV-2 RNA is generally detectable in upper and lower  respiratory specimens during the acute phase of infection. The lowest  concentration of SARS-CoV-2 viral copies this assay can detect is 250  copies / mL. A negative result does not preclude SARS-CoV-2 infection  and should not be used as the sole basis for treatment or other  patient management decisions.  A negative result may occur with  improper specimen collection / handling, submission of specimen other  than nasopharyngeal swab, presence of viral mutation(s) within the  areas targeted by this assay, and inadequate number of viral copies  (<250 copies / mL). A negative result must be combined with clinical  observations, patient history, and epidemiological information. If result is  POSITIVE SARS-CoV-2 target nucleic acids are DETECTED. The SARS-CoV-2 RNA is generally detectable in upper and lower  respiratory specimens dur ing the acute phase of infection.  Positive  results are indicative of active infection with SARS-CoV-2.  Clinical  correlation with patient history and other diagnostic information is  necessary to determine patient infection status.  Positive results do  not rule out bacterial infection or co-infection with other viruses. If result is PRESUMPTIVE POSTIVE SARS-CoV-2 nucleic acids MAY BE PRESENT.   A presumptive positive result was obtained on the submitted specimen  and confirmed on repeat testing.  While 2019 novel coronavirus  (SARS-CoV-2) nucleic acids may be present in the submitted sample  additional confirmatory testing may be necessary for epidemiological  and / or  clinical management purposes  to differentiate between  SARS-CoV-2 and other Sarbecovirus currently known to infect humans.  If clinically indicated additional testing with an alternate test  methodology [phone]) is advised. The SARS-CoV-2 RNA is generally  detectable in upper and lower respiratory sp ecimens during the acute  phase of infection. The expected result is Negative. Fact Sheet for Patients:  StrictlyIdeas.no Fact Sheet for Healthcare Providers: BankingDealers.co.za This test is not yet approved or cleared by the [place] FDA and has been authorized for detection and/or diagnosis of SARS-CoV-2 by FDA under an Emergency Use Authorization (EUA).  This EUA will remain in effect (meaning this test can be used) for the duration of the COVID-19 declaration under Section 564(b)(1) of the Act, 21 U.S.C. section 360bbb-3(b)(1), unless the authorization is terminated or revoked sooner. Performed at [facility], [place] [address] [place], [place] [address]      Radiology Studies: No results found.  Scheduled Meds: . amiodarone  200 mg Oral Daily  . carvedilol  3.125 mg Oral BID WC  . Chlorhexidine Gluconate Cloth  6 each Topical Q0600  . [START ON 10/11/2018] darbepoetin (ARANESP) injection - DIALYSIS  100 mcg Intravenous Q Tue-HD  . docusate sodium  100 mg Oral BID  . doxercalciferol  5 mcg Intravenous Q T,Th,Sa-HD  . feeding supplement (NEPRO CARB STEADY)  237 mL Oral BID BM  . gabapentin  100 mg Oral BID  . isosorbide mononitrate  60 mg Oral Daily  . multivitamin  1 tablet Oral QHS  . pravastatin  40 mg Oral Daily  . sevelamer carbonate  1,600 mg Oral With snacks  . sevelamer carbonate  2,400 mg Oral TID WC   Continuous Infusions: . methocarbamol (ROBAXIN) IV Stopped (10/06/18 0413)     LOS: 3 days   Marylu Lund, MD [facility] Pager On Amion  If 7PM-7AM, please contact night-coverage 10/07/2018,  12:18 PM

## 2018-10-07 NOTE — Patient Care Conference (Signed)
Called and updated patient's daughter over phone. All questions answered. 

## 2018-10-07 NOTE — Progress Notes (Addendum)
[facility] Progress Note   Dialysis Orders: 4h  350/600   53kg   P4   AVG  Hep 1400 - Hectoral 51mcg IV q HD - Mircera 100 q 2 weeks (last 5/12)  Assessment/Plan: 1. Right hip fx s/p fall - s/p intramedullary implant 5/19  2. ESRD - TTS - next HD Sat hold heparin for now 3. Anemia ABLA- post op hgb drop to 6 transfused 1 unit PRBC 5/21  up to 7.4 today ; for ESA redose 5/26 - transfuse again Saturday if low 7s - believe low hgb contributing to her feeling so badly 4. Secondary hyperparathyroidism - stable continue current meds 5. HTN/volume -  Net UF 1.6 Thursday post wt 58.6 - well above edw but these are bed weights- 6. Nutrition - renal diet/suppl added vits 7. Cards: PM for  SSS, Afib on coreg/amio, HFpEF 8. Thrombocytopenia - platelet drop noted yesterday  Myriam Jacobson, PA-C [place] 10/07/2018,8:50 AM  LOS: 3 days   Pt seen, examined and agree w A/P as above.  Kelly Splinter  MD 10/07/2018, 10:47 AM    Subjective:   PT is very hard.  Her daughter is bringing her hearing aids.  Doesn't remember HD yesterday.  Ate part of breakfast.  Objective Vitals:   10/06/18 1923 10/06/18 2028 10/07/18 0352 10/07/18 0657  BP: (!) 112/36 (!) 117/38 (!) 133/45 136/66  Pulse: 78 76 77   Resp: 15  15   Temp: 99.4 F (37.4 C)  98.7 F (37.1 C)   TempSrc: Oral  Oral   SpO2: (!) 84% 92% (!) 87% 94%  Weight:      Height:       Physical Exam General:frai elderly female sitting up in bed Heart: RRR Lungs: no rales  Abdomen: soft NTND Extremities: no LE edema bandage right hip Dialysis Access:  Right upper AVGG + bruit   Additional Objective Labs: Basic Metabolic Panel: Recent Labs  Lab 10/04/18 1324 10/05/18 0346  NA 142 139  K 3.8 5.3*  CL 99 95*  CO2 27 26  GLUCOSE 115* 142*  BUN 23 32*  CREATININE 6.55* 7.23*  CALCIUM 8.4* 8.3*   Liver Function Tests: No results for input(s): AST, ALT, ALKPHOS, BILITOT, PROT,  ALBUMIN in the last 168 hours. No results for input(s): LIPASE, AMYLASE in the last 168 hours. CBC: Recent Labs  Lab 10/04/18 1324 10/05/18 0346 10/06/18 0225 10/06/18 1147  WBC 8.6 7.9 11.6*  --   NEUTROABS 6.7  --   --   --   HGB 10.6* 8.8* 6.1* 7.4*  HCT 34.6* 28.2* 19.7* 23.6*  MCV 105.5* 103.7* 103.1*  --   PLT 171 163 129*  --    Blood Culture    Component Value Date/Time   SDES BLOOD LEFT ANTECUBITAL 07/18/2018 2250   SPECREQUEST  07/18/2018 2250    BOTTLES DRAWN AEROBIC AND ANAEROBIC Blood Culture adequate volume   CULT  07/18/2018 2250    NO GROWTH 5 DAYS Performed at [facility], [place] [address], [place], [place] [address]    REPTSTATUS 07/23/2018 FINAL 07/18/2018 2250    Cardiac Enzymes: No results for input(s): CKTOTAL, CKMB, CKMBINDEX, TROPONINI in the last 168 hours. CBG: No results for input(s): GLUCAP in the last 168 hours. Iron Studies: No results for input(s): IRON, TIBC, TRANSFERRIN, FERRITIN in the last 72 hours. Lab Results  Component Value Date   INR 1.0 10/04/2018   INR 1.0 07/22/2018  INR 1.0 07/21/2018   PROTIME 23.4 (A) 07/14/2018   PROTIME 25.5 (A) 07/07/2018   PROTIME 22.9 (A) 06/30/2018   PROTIME 22.9 (A) 06/30/2018   Studies/Results: No results found. Medications: . methocarbamol (ROBAXIN) IV Stopped (10/06/18 0413)   . amiodarone  200 mg Oral Daily  . carvedilol  3.125 mg Oral BID WC  . Chlorhexidine Gluconate Cloth  6 each Topical Q0600  . Chlorhexidine Gluconate Cloth  6 each Topical Q0600  . docusate sodium  100 mg Oral BID  . doxercalciferol  5 mcg Intravenous Q T,Th,Sa-HD  . gabapentin  100 mg Oral BID  . isosorbide mononitrate  60 mg Oral Daily  . pravastatin  40 mg Oral Daily  . sevelamer carbonate  1,600 mg Oral With snacks  . sevelamer carbonate  2,400 mg Oral TID WC

## 2018-10-07 NOTE — Progress Notes (Signed)
Occupational Therapy Treatment Patient Details Name: Valerie Terrell MRN: [id number] DOB: 07-28-1932 Today's Date: 10/07/2018    History of present illness Pt is an 85 y.o. female admitted 10/04/18 after falling at home sustaining R hip fx. S/p R intertrochanteric IM nail 5/19. PMH includes ESRD (HD TTS), CHF, CVA, pacemaker, CHF, O2 dependence, compression fxs.   OT comments  Pt demonstrating increased activity tolerance. Pt standing with +2 mod assist using stedy lift for transfer from bed to recliner (use of bariatric due to right hip pain). Pt more agreeable to SNF recommendation today.  Will continue to follow acutely.  Follow Up Recommendations  SNF    Equipment Recommendations  3 in 1 bedside commode    Recommendations for Other Services      Precautions / Restrictions Precautions Precautions: Fall Restrictions Weight Bearing Restrictions: Yes RLE Weight Bearing: Weight bearing as tolerated       Mobility Bed Mobility Overal bed mobility: Needs Assistance Bed Mobility: Supine to Sit     Supine to sit: Max assist;+2 for safety/equipment;HOB elevated        Transfers Overall transfer level: Needs assistance Equipment used: Ambulation equipment used             General transfer comment: ModA+2 to stand into stedy lift (bariatric), requiring maxA for hip extension; heavy reliance on UE support to assist. Unable to achieve fully upright standing    Balance Overall balance assessment: Needs assistance Sitting-balance support: Single extremity supported;Bilateral upper extremity supported Sitting balance-Leahy Scale: Fair                                 ADL either performed or assessed with clinical judgement   ADL Overall ADL's : Needs assistance/impaired Eating/Feeding: Supervision/ safety;Sitting                   Lower Body Dressing: Total assistance;Bed level                 General ADL Comments: Pt sitting EOB ~5 minutes  with close guarding and intermittent min assist for midline posture. Pt leans heavily to left side.     Vision       Perception     Praxis      Cognition Arousal/Alertness: Awake/alert Behavior During Therapy: Flat affect Overall Cognitive Status: Impaired/Different from baseline Area of Impairment: Orientation;Attention;Memory;Following commands;Safety/judgement;Awareness;Problem solving                 Orientation Level: Disoriented to;Time;Situation Current Attention Level: Sustained Memory: Decreased short-term memory;Decreased recall of precautions Following Commands: Follows one step commands with increased time Safety/Judgement: Decreased awareness of deficits Awareness: Intellectual Problem Solving: Slow processing;Decreased initiation;Difficulty sequencing;Requires verbal cues;Requires tactile cues General Comments: Cognition improved since yesterday, but still with intermittent moments of confusion and slow to respond to conversation/commands. Able to be redirected        Exercises     Shoulder Instructions       General Comments      Pertinent Vitals/ Pain       Pain Assessment: Faces Faces Pain Scale: Hurts even more Pain Location: RLE Pain Descriptors / Indicators: Grimacing;Guarding;Moaning Pain Intervention(s): Limited activity within patient's tolerance;Premedicated before session;Monitored during session  Home Living  Prior Functioning/Environment              Frequency  Min 2X/week        Progress Toward Goals  OT Goals(current goals can now be found in the care plan section)  Progress towards OT goals: Progressing toward goals  Acute Rehab OT Goals Patient Stated Goal: Get out of this bed; be able to eat without shaking OT Goal Formulation: With patient Time For Goal Achievement: 10/20/18 Potential to Achieve Goals: Good ADL Goals Pt Will Perform Lower Body  Bathing: with min assist;sitting/lateral leans;with adaptive equipment Pt Will Perform Lower Body Dressing: with min assist;with adaptive equipment;sitting/lateral leans Pt Will Transfer to Toilet: with mod assist;stand pivot transfer;bedside commode Pt Will Perform Toileting - Clothing Manipulation and hygiene: with min guard assist;sitting/lateral leans Pt Will Perform Tub/Shower Transfer: with mod assist;3 in 1;rolling walker  Plan Discharge plan remains appropriate;Frequency remains appropriate    Co-evaluation    PT/OT/SLP Co-Evaluation/Treatment: Yes Reason for Co-Treatment: For patient/therapist safety;To address functional/ADL transfers   OT goals addressed during session: ADL's and self-care(functional transfers)      AM-PAC OT "6 Clicks" Daily Activity     Outcome Measure   Help from another person eating meals?: A Little Help from another person taking care of personal grooming?: A Little Help from another person toileting, which includes using toliet, bedpan, or urinal?: A Lot Help from another person bathing (including washing, rinsing, drying)?: A Lot Help from another person to put on and taking off regular upper body clothing?: A Little Help from another person to put on and taking off regular lower body clothing?: Total 6 Click Score: 14    End of Session Equipment Utilized During Treatment: Oxygen  OT Visit Diagnosis: Unsteadiness on feet (R26.81);Pain   Activity Tolerance Patient tolerated treatment well   Patient Left in chair;with call bell/phone within reach;with chair alarm set   Nurse Communication Mobility status;Need for lift equipment(use bariatric stedy)        Time: 1510-1540 OT Time Calculation (min): 30 min  Charges: OT General Charges $OT Visit: 1 Visit OT Treatments $Therapeutic Activity: 8-22 mins     Darrol Jump OTR/L 10/07/2018, 4:47 PM

## 2018-10-07 NOTE — Progress Notes (Signed)
Physical Therapy Treatment Patient Details Name: Lanijah Warzecha MRN: [id number] DOB: November 29, 1932 Today's Date: 10/07/2018    History of Present Illness Pt is an 85 y.o. female admitted 10/04/18 after falling at home sustaining R hip fx. S/p R intertrochanteric IM nail 5/19. PMH includes ESRD (HD TTS), CHF, CVA, pacemaker, CHF, O2 dependence, compression fxs.   PT Comments    Pt progressing with mobility. Able to stand with modA+2 with Stedy lift for transfer to recliner; demonstrates improved cognition since yesterday's session. Pt remains limited by generalized pain and weakness; unable to achieve fully upright standing despite maxA+2 or take steps. Continue to recommend SNF-level therapies; pt starting to warm up to the idea as she recognizes her daughter will not be able to provide necessary level of assist at home. Pt motivated to regain PLOF. Will follow acutely.    Follow Up Recommendations  SNF;Supervision/Assistance - 24 hour     Equipment Recommendations  (TBD)    Recommendations for Other Services       Precautions / Restrictions Precautions Precautions: Fall Restrictions Weight Bearing Restrictions: Yes RLE Weight Bearing: Weight bearing as tolerated    Mobility  Bed Mobility Overal bed mobility: Needs Assistance Bed Mobility: Supine to Sit     Supine to sit: Max assist;+2 for safety/equipment;HOB elevated        Transfers Overall transfer level: Needs assistance Equipment used: Ambulation equipment used             General transfer comment: ModA+2 to stand into stedy lift (bariatric), requiring maxA for hip extension; heavy reliance on UE support to assist. Unable to achieve fully upright standing  Ambulation/Gait             General Gait Details: Unable   Stairs             Wheelchair Mobility    Modified Rankin (Stroke Patients Only)       Balance Overall balance assessment: Needs assistance   Sitting balance-Leahy Scale:  Fair Sitting balance - Comments: Still with L lateral lean to offload painful R hip, but able to maintain balance with bouts of min guard                                    Cognition Arousal/Alertness: Awake/alert Behavior During Therapy: Flat affect Overall Cognitive Status: Impaired/Different from baseline Area of Impairment: Orientation;Attention;Memory;Following commands;Safety/judgement;Awareness;Problem solving                 Orientation Level: Disoriented to;Time;Situation Current Attention Level: Sustained Memory: Decreased short-term memory;Decreased recall of precautions Following Commands: Follows one step commands with increased time Safety/Judgement: Decreased awareness of deficits Awareness: Intellectual Problem Solving: Slow processing;Decreased initiation;Difficulty sequencing;Requires verbal cues;Requires tactile cues General Comments: Cognition improved since yesterday, but still with intermittent moments of confusion and slow to respond to conversation/commands. Able to be redirected      Exercises      General Comments        Pertinent Vitals/Pain Pain Assessment: Faces Faces Pain Scale: Hurts even more Pain Location: RLE Pain Descriptors / Indicators: Grimacing;Guarding;Moaning Pain Intervention(s): Limited activity within patient's tolerance;Repositioned    Home Living                      Prior Function            PT Goals (current goals can now be found in the care plan section) Acute  Rehab PT Goals Patient Stated Goal: Get out of this bed; be able to eat without shaking PT Goal Formulation: With patient Time For Goal Achievement: 10/20/18 Potential to Achieve Goals: Fair Progress towards PT goals: Progressing toward goals    Frequency    Min 3X/week      PT Plan Current plan remains appropriate    Co-evaluation              AM-PAC PT "6 Clicks" Mobility   Outcome Measure  Help needed turning  from your back to your side while in a flat bed without using bedrails?: A Lot Help needed moving from lying on your back to sitting on the side of a flat bed without using bedrails?: A Lot Help needed moving to and from a bed to a chair (including a wheelchair)?: Total Help needed standing up from a chair using your arms (e.g., wheelchair or bedside chair)?: Total Help needed to walk in hospital room?: Total Help needed climbing 3-5 steps with a railing? : Total 6 Click Score: 8    End of Session Equipment Utilized During Treatment: Gait belt Activity Tolerance: Patient tolerated treatment well Patient left: in chair;with call bell/phone within reach;with chair alarm set Nurse Communication: Mobility status;Need for lift equipment PT Visit Diagnosis: Other abnormalities of gait and mobility (R26.89);Muscle weakness (generalized) (M62.81);Pain Pain - Right/Left: Right Pain - part of body: Hip     Time: 1510-1540 PT Time Calculation (min) (ACUTE ONLY): 30 min  Charges:  $Therapeutic Activity: 8-22 mins                    Mabeline Caras, PT, DPT Acute Rehabilitation Services  Pager [phone] Office [place] 10/07/2018, 4:14 PM

## 2018-10-07 NOTE — Progress Notes (Signed)
   Subjective:  Patient reports pain as moderate.  Doing better today.  Working hard with PT.  Denies SOB/CP/N/V.  Objective:   VITALS:   Vitals:   10/07/18 0352 10/07/18 0657 10/07/18 1021 10/07/18 1713  BP: (!) 133/45 136/66 (!) 128/58 (!) 103/59  Pulse: 77  82 81  Resp: 15     Temp: 98.7 F (37.1 C)     TempSrc: Oral     SpO2: (!) 87% 94%    Weight:      Height:        Neurologically intact Sensation intact distally Intact pulses distally Dorsiflexion/Plantar flexion intact Incision: dressing C/D/I Compartment soft   Lab Results  Component Value Date   WBC 11.6 (H) 10/06/2018   HGB 7.4 (L) 10/06/2018   HCT 23.6 (L) 10/06/2018   MCV 103.1 (H) 10/06/2018   PLT 129 (L) 10/06/2018   BMET    Component Value Date/Time   NA 139 10/05/2018 0346   NA 138 06/23/2018   K 5.3 (H) 10/05/2018 0346   CL 95 (L) 10/05/2018 0346   CO2 26 10/05/2018 0346   GLUCOSE 142 (H) 10/05/2018 0346   BUN 32 (H) 10/05/2018 0346   BUN 30 (A) 06/23/2018   CREATININE 7.23 (H) 10/05/2018 0346   CREATININE 1.63 (H) 03/24/2016 0110   CALCIUM 8.3 (L) 10/05/2018 0346   CALCIUM 9.5 02/16/2017 0232   GFRNONAA 5 (L) 10/05/2018 0346   GFRAA 6 (L) 10/05/2018 0346     Assessment/Plan: 3 Days Post-Op   Principal Problem:   Hip fracture (HCC) Active Problems:   PAF (paroxysmal atrial fibrillation) (HCC)   Essential hypertension   Cardiac pacemaker   Chronic combined systolic and diastolic heart failure (HCC)   ESRD (end stage renal disease) on dialysis (HCC)   Chronic pain syndrome   Up with therapy WBAT to RLE - maintain dressing until follow up - PT/OT- rec SNF - daily 325 mg asa and SCD for DVT ppx - return to see Dr. Stann Mainland in 2 weeks    Nicholes Stairs 10/07/2018, 6:10 PM   Geralynn Rile, MD [phone]

## 2018-10-07 NOTE — Plan of Care (Signed)
  Problem: Clinical Measurements: Goal: Cardiovascular complication will be avoided Outcome: Progressing   Problem: Activity: Goal: Risk for activity intolerance will decrease Outcome: Progressing   Problem: Nutrition: Goal: Adequate nutrition will be maintained Outcome: Progressing   Problem: Coping: Goal: Level of anxiety will decrease Outcome: Progressing   Problem: Elimination: Goal: Will not experience complications related to bowel motility Outcome: Progressing Goal: Will not experience complications related to urinary retention Outcome: Progressing   Problem: Pain Managment: Goal: General experience of comfort will improve Outcome: Progressing   Problem: Safety: Goal: Ability to remain free from injury will improve Outcome: Progressing   Problem: Skin Integrity: Goal: Risk for impaired skin integrity will decrease Outcome: Progressing   

## 2018-10-07 NOTE — Plan of Care (Signed)

## 2018-10-07 NOTE — Plan of Care (Signed)

## 2018-10-08 ENCOUNTER — Inpatient Hospital Stay (HOSPITAL_COMMUNITY): Payer: Medicare Other

## 2018-10-08 LAB — RENAL FUNCTION PANEL
Albumin: 1.9 g/dL — ABNORMAL LOW (ref 3.5–5.0)
Anion gap: 15 (ref 5–15)
BUN: 40 mg/dL — ABNORMAL HIGH (ref 8–23)
CO2: 27 mmol/L (ref 22–32)
Calcium: 8 mg/dL — ABNORMAL LOW (ref 8.9–10.3)
Chloride: 92 mmol/L — ABNORMAL LOW (ref 98–111)
Creatinine, Ser: 4.84 mg/dL — ABNORMAL HIGH (ref 0.44–1.00)
GFR calc Af Amer: 9 mL/min — ABNORMAL LOW (ref >60)
GFR calc non Af Amer: 8 mL/min — ABNORMAL LOW (ref >60)
Glucose, Bld: 118 mg/dL — ABNORMAL HIGH (ref 70–99)
Phosphorus: 3 mg/dL (ref 2.5–4.6)
Potassium: 3.8 mmol/L (ref 3.5–5.1)
Sodium: 134 mmol/L — ABNORMAL LOW (ref 135–145)

## 2018-10-08 LAB — CBC
HCT: 24.1 % — ABNORMAL LOW (ref 36.0–46.0)
Hemoglobin: 7.5 g/dL — ABNORMAL LOW (ref 12.0–15.0)
MCH: 30.9 pg (ref 26.0–34.0)
MCHC: 31.1 g/dL (ref 30.0–36.0)
MCV: 99.2 fL (ref 80.0–100.0)
Platelets: 147 10*3/uL — ABNORMAL LOW (ref 150–400)
RBC: 2.43 MIL/uL — ABNORMAL LOW (ref 3.87–5.11)
RDW: 16.7 % — ABNORMAL HIGH (ref 11.5–15.5)
WBC: 9.7 10*3/uL (ref 4.0–10.5)
nRBC: 0 % (ref 0.0–0.2)

## 2018-10-08 MED ORDER — DOXERCALCIFEROL 4 MCG/2ML IV SOLN
INTRAVENOUS | Status: AC
  Administered 2018-10-08: 5 ug via INTRAVENOUS
  Filled 2018-10-08: qty 4

## 2018-10-08 MED ORDER — LIDOCAINE HCL (PF) 1 % IJ SOLN: 5.0000 mL | INTRAMUSCULAR | Status: DC | PRN

## 2018-10-08 MED ORDER — HEPARIN SODIUM (PORCINE) 1000 UNIT/ML DIALYSIS
1000.0000 [IU] | INTRAMUSCULAR | Status: DC | PRN
  Filled 2018-10-08: qty 1

## 2018-10-08 MED ORDER — LIDOCAINE-PRILOCAINE 2.5-2.5 % EX CREA
1.0000 "application " | TOPICAL_CREAM | CUTANEOUS | Status: DC | PRN
  Filled 2018-10-08: qty 5

## 2018-10-08 MED ORDER — SODIUM CHLORIDE 0.9 % IV SOLN: 100.0000 mL | INTRAVENOUS | Status: DC | PRN

## 2018-10-08 MED ORDER — PENTAFLUOROPROP-TETRAFLUOROETH EX AERO: 1.0000 "application " | INHALATION_SPRAY | CUTANEOUS | Status: DC | PRN

## 2018-10-08 NOTE — Plan of Care (Signed)

## 2018-10-08 NOTE — Progress Notes (Signed)
PROGRESS NOTE    Valerie Terrell  YHC:[id number] DOB: 05-31-1932 DOA: 10/04/2018 PCP: Tammi Sou, MD    Brief Narrative:  85 y.o. female with medical history significant of CVA; rib and compression fractures; pacemaker; afib off AC due to falls; HTN; HLD; ESRD on TTS HD; chronic O2 dependence; and chronic combined CHF presenting with hip pain.  She was getting ready for HD and has 3 steps from the kitchen to the garage.  She didn't step well and lost her balance.  She landed on her right hip and experienced excruciating pain.  Assessment & Plan:   Principal Problem:   Hip fracture ([place]) Active Problems:   PAF (paroxysmal atrial fibrillation) (HCC)   Essential hypertension   Cardiac pacemaker   Chronic combined systolic and diastolic heart failure (HCC)   ESRD (end stage renal disease) on dialysis [facility])   Chronic pain syndrome   Hip fracture -Mechanical fall resulting in hip fracture -Orthopedics following, s/p surgery 5/19 -Continue analgesic as needed -PT consulted with recommendation for SNF. Pt seems more agreeable with SNF as patient is requiring max assist per PT -SW continues to follow  Afib, not on AC -Rate controlled with Amiodarone, Coreg and has pacemaker -Reportedly no longer taking anticoagulation -Remains stable at present  HTN -Continue Coreg as tolerated -Noted to be hypotensive while on HD recently -BP remains stable at present  Chronic combined CHF -Appears to be compensated at this time -7/19 Echo with preserved EF and grade 2 diastolic dysfunction -continue fluid management with HD  ESRD on HD -Patient on chronic TTS HD -Nephrology consulted -Initially did not tolerate HD secondary to encephalopathy, mentation improved  Chronic pain syndrome -Takes Vicodin and Percocet for pain at home -Patient seems more lethargic/encephalopathic this AM -Have reduced opioid dosing, d/c vicodin, have decreased neurontin dose -Stable this AM. Cont to  minimize analgesic as tolerated  Acute toxic metabolic encephalopathy -Noted to be more confused while on HD recently -per staff, mentation worsened after taking post-op analgesic -Weaned sedating meds. Mentation much improved  Acute blood loss anemia -Hgb recently down to 6 range, appropriately improved with one unit PRBC -Repeat cbc stable with hgb of 7.5. Labs reviewed -Suspect blood loss secondary to recent surgery. Have held ASA for now  Constipation -Miralax and cathartics as needed  DVT prophylaxis: SCD's Code Status: Full Family Communication: Pt in room, family not at bedside Disposition Plan: Possible SNF, timing uncertain  Consultants:   Orthopedic Surgery  Nephrology  Procedures:  Treatment of intertrochanteric, pertrochanteric, subtrochanteric fracture with intramedullary implant  Antimicrobials: Anti-infectives (From admission, onward)   Start     Dose/Rate Route Frequency Ordered Stop   10/04/18 1745  ceFAZolin (ANCEF) IVPB 2g/100 mL premix     2 g 200 mL/hr over 30 Minutes Intravenous On call to O.R. 10/04/18 1656 10/04/18 1805   10/04/18 1657  ceFAZolin (ANCEF) 2-4 GM/100ML-% IVPB    Note to Pharmacy:  Henrine Screws   : cabinet override      10/04/18 1657 10/04/18 1805      Subjective: Complaining of constipation and subjective sob  Objective: Vitals:   10/08/18 1230 10/08/18 1300 10/08/18 1330 10/08/18 1400  BP: (!) 110/54 (!) 107/45 (!) 117/47 (!) 97/42  Pulse: 63 65 69 66  Resp:      Temp:      TempSrc:      SpO2:      Weight:      Height:        Intake/Output  Summary (Last 24 hours) at 10/08/2018 1433 Last data filed at 10/08/2018 4627 Gross per 24 hour  Intake 360 ml  Output -  Net 360 ml   Filed Weights   10/06/18 1130 10/06/18 1506 10/08/18 1205  Weight: 60.3 kg 58.6 kg 60.2 kg    Examination: General exam: Awake, laying in bed, in nad Respiratory system: Normal respiratory effort, no wheezing Cardiovascular system: regular  rate, s1, s2 Gastrointestinal system: Soft, nondistended, positive BS Central nervous system: CN2-12 grossly intact, strength intact Extremities: Perfused, no clubbing Skin: Normal skin turgor, no notable skin lesions seen Psychiatry: Mood normal // no visual hallucinations   Data Reviewed: I have personally reviewed following labs and imaging studies  CBC: Recent Labs  Lab 10/04/18 1324 10/05/18 0346 10/06/18 0225 10/06/18 1147 10/08/18 0821  WBC 8.6 7.9 11.6*  --  9.7  NEUTROABS 6.7  --   --   --   --   HGB 10.6* 8.8* 6.1* 7.4* 7.5*  HCT 34.6* 28.2* 19.7* 23.6* 24.1*  MCV 105.5* 103.7* 103.1*  --  99.2  PLT 171 163 129*  --  035*   Basic Metabolic Panel: Recent Labs  Lab 10/04/18 1324 10/05/18 0346 10/08/18 1240  NA 142 139 134*  K 3.8 5.3* 3.8  CL 99 95* 92*  CO2 27 26 27   GLUCOSE 115* 142* 118*  BUN 23 32* 40*  CREATININE 6.55* 7.23* 4.84*  CALCIUM 8.4* 8.3* 8.0*  PHOS  --   --  3.0   GFR: Estimated Creatinine Clearance: 7 mL/min (A) (by C-G formula based on SCr of 4.84 mg/dL (H)). Liver Function Tests: Recent Labs  Lab 10/08/18 1240  ALBUMIN 1.9*   No results for input(s): LIPASE, AMYLASE in the last 168 hours. No results for input(s): AMMONIA in the last 168 hours. Coagulation Profile: Recent Labs  Lab 10/04/18 1324  INR 1.0   Cardiac Enzymes: No results for input(s): CKTOTAL, CKMB, CKMBINDEX, TROPONINI in the last 168 hours. BNP (last 3 results) No results for input(s): PROBNP in the last 8760 hours. HbA1C: No results for input(s): HGBA1C in the last 72 hours. CBG: No results for input(s): GLUCAP in the last 168 hours. Lipid Profile: No results for input(s): CHOL, HDL, LDLCALC, TRIG, CHOLHDL, LDLDIRECT in the last 72 hours. Thyroid Function Tests: No results for input(s): TSH, T4TOTAL, FREET4, T3FREE, THYROIDAB in the last 72 hours. Anemia Panel: No results for input(s): VITAMINB12, FOLATE, FERRITIN, TIBC, IRON, RETICCTPCT in the last 72  hours. Sepsis Labs: No results for input(s): PROCALCITON, LATICACIDVEN in the last 168 hours.  Recent Results (from the past 240 hour(s))  SARS Coronavirus 2 (CEPHEID - Performed in [place] hospital lab), Hosp Order     Status: None   Collection Time: 10/04/18  1:24 PM  Result Value Ref Range Status   SARS Coronavirus 2 NEGATIVE NEGATIVE Final    Comment: (NOTE) If result is NEGATIVE SARS-CoV-2 target nucleic acids are NOT DETECTED. The SARS-CoV-2 RNA is generally detectable in upper and lower  respiratory specimens during the acute phase of infection. The lowest  concentration of SARS-CoV-2 viral copies this assay can detect is 250  copies / mL. A negative result does not preclude SARS-CoV-2 infection  and should not be used as the sole basis for treatment or other  patient management decisions.  A negative result may occur with  improper specimen collection / handling, submission of specimen other  than nasopharyngeal swab, presence of viral mutation(s) within the  areas targeted by  this assay, and inadequate number of viral copies  (<250 copies / mL). A negative result must be combined with clinical  observations, patient history, and epidemiological information. If result is POSITIVE SARS-CoV-2 target nucleic acids are DETECTED. The SARS-CoV-2 RNA is generally detectable in upper and lower  respiratory specimens dur ing the acute phase of infection.  Positive  results are indicative of active infection with SARS-CoV-2.  Clinical  correlation with patient history and other diagnostic information is  necessary to determine patient infection status.  Positive results do  not rule out bacterial infection or co-infection with other viruses. If result is PRESUMPTIVE POSTIVE SARS-CoV-2 nucleic acids MAY BE PRESENT.   A presumptive positive result was obtained on the submitted specimen  and confirmed on repeat testing.  While 2019 novel coronavirus  (SARS-CoV-2) nucleic acids may  be present in the submitted sample  additional confirmatory testing may be necessary for epidemiological  and / or clinical management purposes  to differentiate between  SARS-CoV-2 and other Sarbecovirus currently known to infect humans.  If clinically indicated additional testing with an alternate test  methodology [phone]) is advised. The SARS-CoV-2 RNA is generally  detectable in upper and lower respiratory sp ecimens during the acute  phase of infection. The expected result is Negative. Fact Sheet for Patients:  StrictlyIdeas.no Fact Sheet for Healthcare Providers: BankingDealers.co.za This test is not yet approved or cleared by the [place] FDA and has been authorized for detection and/or diagnosis of SARS-CoV-2 by FDA under an Emergency Use Authorization (EUA).  This EUA will remain in effect (meaning this test can be used) for the duration of the COVID-19 declaration under Section 564(b)(1) of the Act, 21 U.S.C. section 360bbb-3(b)(1), unless the authorization is terminated or revoked sooner. Performed at [facility], [place] [address] [place], [place] [address]      Radiology Studies: No results found.  Scheduled Meds: . amiodarone  200 mg Oral Daily  . aspirin  325 mg Oral Daily  . carvedilol  3.125 mg Oral BID WC  . Chlorhexidine Gluconate Cloth  6 each Topical Q0600  . [START ON 10/11/2018] darbepoetin (ARANESP) injection - DIALYSIS  100 mcg Intravenous Q Tue-HD  . docusate sodium  100 mg Oral BID  . doxercalciferol  5 mcg Intravenous Q T,Th,Sa-HD  . feeding supplement (NEPRO CARB STEADY)  237 mL Oral BID BM  . gabapentin  100 mg Oral BID  . isosorbide mononitrate  60 mg Oral Daily  . multivitamin  1 tablet Oral QHS  . pravastatin  40 mg Oral Daily  . sevelamer carbonate  1,600 mg Oral With snacks  . sevelamer carbonate  2,400 mg Oral TID WC   Continuous Infusions: . sodium chloride    . sodium chloride     . methocarbamol (ROBAXIN) IV Stopped (10/06/18 0413)     LOS: 4 days   Marylu Lund, MD [facility] Pager On Amion  If 7PM-7AM, please contact night-coverage 10/08/2018, 2:33 PM

## 2018-10-08 NOTE — Progress Notes (Signed)
Subjective: 4 Days Post-Op Procedure(s) (LRB): INTRAMEDULLARY (IM) NAIL INTERTROCHANTRIC (Right) Patient reports pain as Mild to right hip.  Reports a good night. Tolerating PO's. Progress with PT.  Objective: Vital signs in last 24 hours: Temp:  [98.6 F (37 C)-98.7 F (37.1 C)] 98.7 F (37.1 C) (05/23 0459) Pulse Rate:  [66-82] 66 (05/23 0459) Resp:  [16-18] 16 (05/23 0459) BP: (103-128)/(37-59) 105/37 (05/23 0459) SpO2:  [100 %] 100 % (05/23 0459)  Intake/Output from previous day: 05/22 0701 - 05/23 0700 In: 240 [P.O.:240] Out: -  Intake/Output this shift: No intake/output data recorded.  Recent Labs    10/06/18 0225 10/06/18 1147  HGB 6.1* 7.4*   Recent Labs    10/06/18 0225 10/06/18 1147  WBC 11.6*  --   RBC 1.91*  --   HCT 19.7* 23.6*  PLT 129*  --    No results for input(s): NA, K, CL, CO2, BUN, CREATININE, GLUCOSE, CALCIUM in the last 72 hours. No results for input(s): LABPT, INR in the last 72 hours.  Well nourished. Alert and oriented x3. RRR, Lungs clear, BS x4. Abdomen soft and non tender. Right Calf soft and non tender. Right hip dressing C/D/I. No DVT signs. Compartment soft. No signs of infection.  Right LE grossly neurovascular intact.   Assessment/Plan: 4 Days Post-Op Procedure(s) (LRB): INTRAMEDULLARY (IM) NAIL INTERTROCHANTRIC (Right) Up with PT Advance Diet On Aspirin 325mg  D/c planning    Anticipated LOS equal to or greater than 2 midnights due to - Age 79 and older with one or more of the following:  - Obesity  - Expected need for hospital services (PT, OT, Nursing) required for safe  discharge  - Anticipated need for postoperative skilled nursing care or inpatient rehab  - Active co-morbidities: None OR   - Unanticipated findings during/Post Surgery: Slow post-op progression: GI, pain control, mobility  - Patient is a high risk of re-admission due to: None    Arda Keadle L Marni Franzoni 10/08/2018, 7:54 AM

## 2018-10-08 NOTE — Progress Notes (Addendum)
[facility] Progress Note   Dialysis Orders: 4h  350/600   53kg   P4   AVG  Hep 1400 - Hectoral 60mcg IV q HD - Mircera 100 q 2 weeks (last 5/12)  Assessment/Plan: 1. Right hip fx s/p fall - s/p intramedullary implant 5/19  2. ESRD - TTS - next HD today  hold heparin for now 3. Anemia ABLA- post op hgb drop to 6 transfused 1 unit PRBC 5/21  up to 7.4; for ESA redose 5/26 - transfuse again Saturday if low 7s - believe low hgb contributing to her feeling so badly 4. Secondary hyperparathyroidism - stable continue current meds 5. HTN/volume -  Net UF 1.6 Thursday post wt 58.6 - well above edw but these are bed weights- 6. Nutrition - renal diet/suppl added vits - encouraged nepro 7. Cards: PM for  SSS, Afib on coreg/amio, HFpEF 8. Thrombocytopenia - platelet drop noted follow 9. Disp - likely needs SNF for rehab   Myriam Jacobson, PA-C [place] [phone] 10/08/2018,8:32 AM  LOS: 4 days   Pt seen, examined and agree w A/P as above. Looking better, feeling better and remembering more today.  Kelly Splinter  MD 10/08/2018, 11:21 AM    Subjective:  "I'm eating what I can" Poor breakfast intake Objective Vitals:   10/07/18 1021 10/07/18 1713 10/07/18 2053 10/08/18 0459  BP: (!) 128/58 (!) 103/59 (!) 128/47 (!) 105/37  Pulse: 82 81 75 66  Resp:   18 16  Temp:   98.6 F (37 C) 98.7 F (37.1 C)  TempSrc:   Oral Oral  SpO2:   100% 100%  Weight:      Height:       Physical Exam General:frail elderly female sitting up in bed Heart: RRR Lungs: no rales  Abdomen: soft NTND Extremities: no LE edema bandage right hip Dialysis Access:  Right upper AVGG + bruit   Additional Objective Labs: Basic Metabolic Panel: Recent Labs  Lab 10/04/18 1324 10/05/18 0346  NA 142 139  K 3.8 5.3*  CL 99 95*  CO2 27 26  GLUCOSE 115* 142*  BUN 23 32*  CREATININE 6.55* 7.23*  CALCIUM 8.4* 8.3*   Liver Function Tests: No results for input(s): AST,  ALT, ALKPHOS, BILITOT, PROT, ALBUMIN in the last 168 hours. No results for input(s): LIPASE, AMYLASE in the last 168 hours. CBC: Recent Labs  Lab 10/04/18 1324 10/05/18 0346 10/06/18 0225 10/06/18 1147  WBC 8.6 7.9 11.6*  --   NEUTROABS 6.7  --   --   --   HGB 10.6* 8.8* 6.1* 7.4*  HCT 34.6* 28.2* 19.7* 23.6*  MCV 105.5* 103.7* 103.1*  --   PLT 171 163 129*  --   Iron Studies: No results for input(s): IRON, TIBC, TRANSFERRIN, FERRITIN in the last 72 hours. Studies/Results: No results found. Medications: . methocarbamol (ROBAXIN) IV Stopped (10/06/18 0413)   . amiodarone  200 mg Oral Daily  . aspirin  325 mg Oral Daily  . carvedilol  3.125 mg Oral BID WC  . Chlorhexidine Gluconate Cloth  6 each Topical Q0600  . [START ON 10/11/2018] darbepoetin (ARANESP) injection - DIALYSIS  100 mcg Intravenous Q Tue-HD  . docusate sodium  100 mg Oral BID  . doxercalciferol  5 mcg Intravenous Q T,Th,Sa-HD  . feeding supplement (NEPRO CARB STEADY)  237 mL Oral BID BM  . gabapentin  100 mg Oral BID  . isosorbide mononitrate  60 mg Oral Daily  .  multivitamin  1 tablet Oral QHS  . pravastatin  40 mg Oral Daily  . sevelamer carbonate  1,600 mg Oral With snacks  . sevelamer carbonate  2,400 mg Oral TID WC

## 2018-10-08 NOTE — TOC Progression Note (Signed)
Transition of Care [facility]) - Progression Note    Patient Details  Name: Valerie Terrell MRN: [id number] Date of Birth: [birth year]  Transition of Care [facility]) CM/SW [place], [place] Phone Number: 10/08/2018, 9:28 AM  Clinical Narrative:    CSW spoke with pt daughter on phone, discussed interest in SNF placement. At the current time we must ensure pt can tolerate sitting up for dialysis today and pt daughter is aware.   Pt daughter requests "only the best and highest rated facilities," pt is a retired Merchandiser, retail and is familiar and wary of facilities. We discussed that due to dialysis and current coronavirus outbreak that many of the CMS highest rated facilities are not accepting dialysis patients, or any additional patients out of their communities. The restrictions on visitation and tours was also discussed. Pt does not take SCAT nor does pt daughter want for her to get on SCAT transport. She would prefer for the pt to be transported by the facility. CSW will f/u with pt daughter with offers as available. Her email is [email or web address].  Expected Discharge Plan: [place] Barriers to Discharge: Continued Medical Work up  Expected Discharge Plan and Services Expected Discharge Plan: [place] In-house Referral: Clinical Social Work   Post Acute Care Choice: [place] Living arrangements for the past 2 months: [place] Determinants of Health (SDOH) Interventions    Readmission Risk Interventions Readmission Risk Prevention Plan 10/06/2018  Transportation Screening Complete  Medication Review Press photographer) Complete  PCP or Specialist appointment within 3-5 days of discharge Complete  HRI or Home Care Consult Complete  SW Recovery Care/Counseling Consult Complete  Palliative Care Screening Not [place] Complete  Some recent data might be hidden

## 2018-10-08 NOTE — Progress Notes (Signed)
Physical Therapy Treatment Patient Details Name: Valerie Terrell MRN: [id number] DOB: [birth year] Today's Date: 10/08/2018    History of Present Illness Pt is an 85 y.o. female admitted 10/04/18 after falling at home sustaining R hip fx. S/p R intertrochanteric IM nail 5/19. PMH includes ESRD (HD TTS), CHF, CVA, pacemaker, CHF, O2 dependence, compression fxs.    PT Comments    Pt making slow progress with functional mobility. She continues to require heavy physical assistance of two for bed mobility and transfers. Pt would continue to benefit from skilled physical therapy services at this time while admitted and after d/c to address the below listed limitations in order to improve overall safety and independence with functional mobility.    Follow Up Recommendations  SNF;Supervision/Assistance - 24 hour     Equipment Recommendations  None recommended by PT    Recommendations for Other Services       Precautions / Restrictions Precautions Precautions: Fall Restrictions Weight Bearing Restrictions: Yes RLE Weight Bearing: Weight bearing as tolerated    Mobility  Bed Mobility Overal bed mobility: Needs Assistance Bed Mobility: Supine to Sit;Sit to Supine     Supine to sit: HOB elevated;+2 for physical assistance;Mod assist Sit to supine: Mod assist;+2 for physical assistance   General bed mobility comments: increased time and effort, use of bed pads to position pt's hips at EOB, cueing for sequencing  Transfers Overall transfer level: Needs assistance Equipment used: Rolling walker (2 wheeled) Transfers: Sit to/from Stand Sit to Stand: Mod assist;+2 physical assistance         General transfer comment: assistance to power into standing with tactile cueing to glutes for hip extension and full upright standing  Ambulation/Gait             General Gait Details: attempted to side step at EOB but unable to pick up either LE   Stairs             Wheelchair  Mobility    Modified Rankin (Stroke Patients Only)       Balance Overall balance assessment: Needs assistance Sitting-balance support: Feet supported Sitting balance-Leahy Scale: Fair     Standing balance support: Bilateral upper extremity supported Standing balance-Leahy Scale: Poor                              Cognition Arousal/Alertness: Awake/alert Behavior During Therapy: Flat affect Overall Cognitive Status: Impaired/Different from baseline Area of Impairment: Following commands;Safety/judgement;Problem solving                       Following Commands: Follows one step commands with increased time Safety/Judgement: Decreased awareness of deficits;Decreased awareness of safety   Problem Solving: Slow processing;Decreased initiation;Difficulty sequencing;Requires verbal cues;Requires tactile cues        Exercises General Exercises - Lower Extremity Ankle Circles/Pumps: AROM;Right;10 reps;Seated Long Arc Quad: AAROM;Right;10 reps;Seated Hip Flexion/Marching: AAROM;Right;10 reps;Seated    General Comments        Pertinent Vitals/Pain Pain Assessment: Faces Faces Pain Scale: Hurts little more Pain Location: RLE Pain Descriptors / Indicators: Grimacing;Guarding;Moaning Pain Intervention(s): Monitored during session;Repositioned    Home Living                      Prior Function            PT Goals (current goals can now be found in the care plan section) Acute Rehab PT Goals PT Goal  Formulation: With patient Time For Goal Achievement: 10/20/18 Potential to Achieve Goals: Fair Progress towards PT goals: Progressing toward goals    Frequency    Min 3X/week      PT Plan Current plan remains appropriate    Co-evaluation              AM-PAC PT "6 Clicks" Mobility   Outcome Measure  Help needed turning from your back to your side while in a flat bed without using bedrails?: A Lot Help needed moving from lying  on your back to sitting on the side of a flat bed without using bedrails?: A Lot Help needed moving to and from a bed to a chair (including a wheelchair)?: A Lot Help needed standing up from a chair using your arms (e.g., wheelchair or bedside chair)?: A Lot Help needed to walk in hospital room?: Total Help needed climbing 3-5 steps with a railing? : Total 6 Click Score: 10    End of Session Equipment Utilized During Treatment: Gait belt Activity Tolerance: Patient tolerated treatment well Patient left: in bed;with call bell/phone within reach;with bed alarm set;Other (comment)(back in bed as pt was leaving for HD soon) Nurse Communication: Mobility status;Need for lift equipment PT Visit Diagnosis: Other abnormalities of gait and mobility (R26.89);Muscle weakness (generalized) (M62.81);Pain Pain - Right/Left: Right Pain - part of body: Hip     Time: 7614-7092 PT Time Calculation (min) (ACUTE ONLY): 27 min  Charges:  $Therapeutic Exercise: 8-22 mins $Therapeutic Activity: 8-22 mins                     Sherie Don, PT, DPT  Acute Rehabilitation Services Pager [phone] Office [place] 10/08/2018, 1:22 PM

## 2018-10-08 NOTE — Progress Notes (Signed)
36 Pt is back from Hemodialysis. A&O x4, feeling tired. Right upper arm fistula site with dressing dry and intact.

## 2018-10-09 LAB — CBC
HCT: 26.1 % — ABNORMAL LOW (ref 36.0–46.0)
Hemoglobin: 8.1 g/dL — ABNORMAL LOW (ref 12.0–15.0)
MCH: 30.9 pg (ref 26.0–34.0)
MCHC: 31 g/dL (ref 30.0–36.0)
MCV: 99.6 fL (ref 80.0–100.0)
Platelets: 178 10*3/uL (ref 150–400)
RBC: 2.62 MIL/uL — ABNORMAL LOW (ref 3.87–5.11)
RDW: 16 % — ABNORMAL HIGH (ref 11.5–15.5)
WBC: 9.1 10*3/uL (ref 4.0–10.5)
nRBC: 0 % (ref 0.0–0.2)

## 2018-10-09 MED ORDER — VITAMIN C 500 MG PO TABS
500.0000 mg | ORAL_TABLET | Freq: Two times a day (BID) | ORAL | Status: DC
  Administered 2018-10-09 – 2018-10-11 (×5): 500 mg via ORAL
  Filled 2018-10-09 (×5): qty 1

## 2018-10-09 NOTE — Progress Notes (Addendum)
[facility] Progress Note   Dialysis Orders:  TTS NW 4h  350/600   53kg   P4   AVG  Hep 1400 - Hectoral 58mcg IV q HD - Mircera 100 q 2 weeks (last 5/12)  Assessment/Plan: 1. Right hip fx s/p fall - s/p intramedullary implant 5/19  2. ESRD - TTS - next HD Tuesday 3. Anemia ABLA- post op hgb drop to 6 transfused 1 unit PRBC 5/21  for ESA redose 5/26 - hgb up to 8.1 5/24  4. Secondary hyperparathyroidism - stable continue current meds 5. HTN/volume -  Net UF 1.6 Thursday post wt 58.6 -net UF 2 L Saturday to 57.3 still well above EDW; reviewed outpatient records - the last few weeks prior to admission she has only been getting down to 55 at the lowest and often 55 - 56 with average net UF 1 - 1.5 L - continue to gently try to whittle volume down - high risk for weight loss at present due to poor intake 6. Nutrition - renal diet/suppl added vits - encouraged nepro 7. Cards: PM for  SSS, Afib on coreg/amio, HFpEF 8. Thrombocytopenia - platelet drop noted but back to normal now. 9. Disp - l needs SNF for rehab   Myriam Jacobson, PA-C [facility] Beeper [phone] 10/09/2018,9:13 AM  LOS: 5 days   Pt seen, examined and agree w A/P as above.  Kelly Splinter  MD 10/09/2018, 1:02 PM  Subjective:  Worried that a SNF won't take her because she is on dialysis.  Reassured. Does not recall any problems with HD yesterday. Only had a little PT Sat b/c she had to go to dialysis. Hurts to try to move self in bed Objective Vitals:   10/08/18 1649 10/08/18 2024 10/08/18 2112 10/09/18 0352  BP: (!) 123/40 (!) 111/28 (!) 110/35 (!) 126/42  Pulse: 70 70 71 74  Resp:  14  16  Temp: 98.1 F (36.7 C) 99 F (37.2 C)  98.4 F (36.9 C)  TempSrc: Oral Oral  Oral  SpO2: 100% 100%  100%  Weight:      Height:       Physical Exam General:frail elderly female sitting up in bed Heart: RRR Lungs: no rales  Abdomen: soft NTND Extremities: no LE edema bandage right hip Dialysis  Access:  Right upper AVGG + bruit   Additional Objective Labs: Basic Metabolic Panel: Recent Labs  Lab 10/04/18 1324 10/05/18 0346 10/08/18 1240  NA 142 139 134*  K 3.8 5.3* 3.8  CL 99 95* 92*  CO2 27 26 27   GLUCOSE 115* 142* 118*  BUN 23 32* 40*  CREATININE 6.55* 7.23* 4.84*  CALCIUM 8.4* 8.3* 8.0*  PHOS  --   --  3.0   Liver Function Tests: Recent Labs  Lab 10/08/18 1240  ALBUMIN 1.9*   No results for input(s): LIPASE, AMYLASE in the last 168 hours. CBC: Recent Labs  Lab 10/04/18 1324 10/05/18 0346 10/06/18 0225 10/06/18 1147 10/08/18 0821 10/09/18 0410  WBC 8.6 7.9 11.6*  --  9.7 9.1  NEUTROABS 6.7  --   --   --   --   --   HGB 10.6* 8.8* 6.1* 7.4* 7.5* 8.1*  HCT 34.6* 28.2* 19.7* 23.6* 24.1* 26.1*  MCV 105.5* 103.7* 103.1*  --  99.2 99.6  PLT 171 163 129*  --  147* 178  Iron Studies: No results for input(s): IRON, TIBC, TRANSFERRIN, FERRITIN in the last 72 hours. Studies/Results: Dg Chest [employer]  1 View  Result Date: 10/08/2018 CLINICAL DATA:  Hypoxia. EXAM: PORTABLE CHEST 1 VIEW COMPARISON:  10/04/2018 FINDINGS: The cardiac silhouette is enlarged. There is a dual chamber left-sided pacemaker that is stable in appearance. There is no pneumothorax. Patient is status post remote vertebral augmentation of the midthoracic spine. The patient is rotated which limits evaluation. There is an opacity at the left lung base favored to be secondary to patient rotation. IMPRESSION: Examination is limited by patient rotation. Given this limitation, there is no definite acute cardiopulmonary process. Opacity at the left lung base is favored to be secondary to patient rotation. Consider short interval follow-up if symptoms persist. Electronically Signed   By: Constance Holster M.D.   On: 10/08/2018 19:19   Medications: . methocarbamol (ROBAXIN) IV Stopped (10/06/18 0413)   . amiodarone  200 mg Oral Daily  . aspirin  325 mg Oral Daily  . carvedilol  3.125 mg Oral BID WC  .  Chlorhexidine Gluconate Cloth  6 each Topical Q0600  . [START ON 10/11/2018] darbepoetin (ARANESP) injection - DIALYSIS  100 mcg Intravenous Q Tue-HD  . docusate sodium  100 mg Oral BID  . doxercalciferol  5 mcg Intravenous Q T,Th,Sa-HD  . feeding supplement (NEPRO CARB STEADY)  237 mL Oral BID BM  . gabapentin  100 mg Oral BID  . isosorbide mononitrate  60 mg Oral Daily  . multivitamin  1 tablet Oral QHS  . pravastatin  40 mg Oral Daily  . sevelamer carbonate  1,600 mg Oral With snacks  . sevelamer carbonate  2,400 mg Oral TID WC

## 2018-10-09 NOTE — Progress Notes (Signed)
[name], female   DOB: 03-Jun-1932, 85 y.o.   MRN: [id number] Subjective: 5 Days Post-Op Procedure(s) (LRB): INTRAMEDULLARY (IM) NAIL INTERTROCHANTRIC (Right)    Patient sleeping comfortably this am.  No events recorded  Objective:   VITALS:   Vitals:   10/08/18 2112 10/09/18 0352  BP: (!) 110/35 (!) 126/42  Pulse: 71 74  Resp:  16  Temp:  98.4 F (36.9 C)  SpO2:  100%    Incision: dressing C/D/I - right hip  LABS Recent Labs    10/06/18 1147 10/08/18 0821 10/09/18 0410  HGB 7.4* 7.5* 8.1*  HCT 23.6* 24.1* 26.1*  WBC  --  9.7 9.1  PLT  --  147* 178    Recent Labs    10/08/18 1240  NA 134*  K 3.8  BUN 40*  CREATININE 4.84*  GLUCOSE 118*    No results for input(s): LABPT, INR in the last 72 hours.   Assessment/Plan: 5 Days Post-Op Procedure(s) (LRB): INTRAMEDULLARY (IM) NAIL INTERTROCHANTRIC (Right)   Up with therapy  Disposition per therapy needs Weight bearing instructions conveyed to PT RTC in 2 weeks to see ROGERS Call with any further questions

## 2018-10-09 NOTE — Progress Notes (Signed)
PROGRESS NOTE    Valerie Terrell  IPJ:[id number] DOB: [birth year] DOA: 10/04/2018 PCP: Tammi Sou, MD    Brief Narrative:  85 y.o. female with medical history significant of CVA; rib and compression fractures; pacemaker; afib off AC due to falls; HTN; HLD; ESRD on TTS HD; chronic O2 dependence; and chronic combined CHF presenting with hip pain.  She was getting ready for HD and has 3 steps from the kitchen to the garage.  She didn't step well and lost her balance.  She landed on her right hip and experienced excruciating pain.  Assessment & Plan:   Principal Problem:   Hip fracture ([place]) Active Problems:   PAF (paroxysmal atrial fibrillation) (HCC)   Essential hypertension   Cardiac pacemaker   Chronic combined systolic and diastolic heart failure (HCC)   ESRD (end stage renal disease) on dialysis [facility])   Chronic pain syndrome   Hip fracture -Mechanical fall resulting in hip fracture -Orthopedics following, s/p surgery 5/19 -Continue analgesic as needed -PT consulted with recommendation for SNF. Pt seems more agreeable with SNF as patient is requiring max assist per PT -Discussed with SW this AM, continues to follow  Afib, not on AC -Rate controlled with Amiodarone, Coreg and has pacemaker -Reportedly no longer taking anticoagulation -Currently stable at present and rate controlled  HTN -Continue Coreg as tolerated -Noted to be hypotensive while on HD recently -BP thus far stable and tolerating meds  Chronic combined CHF -Appears to be compensated at this time -7/19 Echo with preserved EF and grade 2 diastolic dysfunction -continue fluid management with HD. Reportedly tolerated most recent HD per Nephrology  ESRD on HD -Patient on chronic TTS HD -Nephrology consulted -Tolerated most recent HD per nephrology  Chronic pain syndrome -Takes Vicodin and Percocet for pain at home -Patient seems more lethargic/encephalopathic this AM -Have reduced opioid dosing,  d/c vicodin, have decreased neurontin dose -Stable this AM. Cont to minimize analgesic as patient tolerates  Acute toxic metabolic encephalopathy -Noted to be more confused while on HD recently -per staff, mentation worsened after taking post-op analgesic -Weaned sedating meds. Mentation now normalized  Acute blood loss anemia -Hgb recently down to 6 range, appropriately improved with one unit PRBC -Repeat cbc stable with hgb of 7.5. Labs reviewed -Suspect blood loss secondary to recent surgery. Have held ASA for now  Constipation -Miralax and cathartics as needed -Pt noted to have BM overnight  DVT prophylaxis: SCD's Code Status: Full Family Communication: Pt in room, family not at bedside Disposition Plan: Possible SNF, timing uncertain  Consultants:   Orthopedic Surgery  Nephrology  Procedures:  Treatment of intertrochanteric, pertrochanteric, subtrochanteric fracture with intramedullary implant  Antimicrobials: Anti-infectives (From admission, onward)   Start     Dose/Rate Route Frequency Ordered Stop   10/04/18 1745  ceFAZolin (ANCEF) IVPB 2g/100 mL premix     2 g 200 mL/hr over 30 Minutes Intravenous On call to O.R. 10/04/18 1656 10/04/18 1805   10/04/18 1657  ceFAZolin (ANCEF) 2-4 GM/100ML-% IVPB    Note to Pharmacy:  Henrine Screws   : cabinet override      10/04/18 1657 10/04/18 1805      Subjective: Feeling eager to go to SNF, reports feeling weaker today  Objective: Vitals:   10/08/18 2112 10/09/18 0352 10/09/18 0916 10/09/18 1443  BP: (!) 110/35 (!) 126/42 (!) 122/42 (!) 149/53  Pulse: 71 74 80 84  Resp:  16  18  Temp:  98.4 F (36.9 C)  98.6 F (37  C)  TempSrc:  Oral  Oral  SpO2:  100%  100%  Weight:      Height:        Intake/Output Summary (Last 24 hours) at 10/09/2018 1533 Last data filed at 10/09/2018 1300 Gross per 24 hour  Intake 960 ml  Output 2200 ml  Net -1240 ml   Filed Weights   10/06/18 1506 10/08/18 1205 10/08/18 1614   Weight: 58.6 kg 60.2 kg 57.3 kg    Examination: General exam: Conversant, in no acute distress Respiratory system: normal chest rise, clear, no audible wheezing Cardiovascular system: regular rhythm, s1-s2 Gastrointestinal system: Nondistended, nontender, pos BS Central nervous system: No seizures, no tremors Extremities: No cyanosis, no joint deformities Skin: No rashes, no pallor Psychiatry: Affect normal // no auditory hallucinations   Data Reviewed: I have personally reviewed following labs and imaging studies  CBC: Recent Labs  Lab 10/04/18 1324 10/05/18 0346 10/06/18 0225 10/06/18 1147 10/08/18 0821 10/09/18 0410  WBC 8.6 7.9 11.6*  --  9.7 9.1  NEUTROABS 6.7  --   --   --   --   --   HGB 10.6* 8.8* 6.1* 7.4* 7.5* 8.1*  HCT 34.6* 28.2* 19.7* 23.6* 24.1* 26.1*  MCV 105.5* 103.7* 103.1*  --  99.2 99.6  PLT 171 163 129*  --  147* 403   Basic Metabolic Panel: Recent Labs  Lab 10/04/18 1324 10/05/18 0346 10/08/18 1240  NA 142 139 134*  K 3.8 5.3* 3.8  CL 99 95* 92*  CO2 27 26 27   GLUCOSE 115* 142* 118*  BUN 23 32* 40*  CREATININE 6.55* 7.23* 4.84*  CALCIUM 8.4* 8.3* 8.0*  PHOS  --   --  3.0   GFR: Estimated Creatinine Clearance: 6.8 mL/min (A) (by C-G formula based on SCr of 4.84 mg/dL (H)). Liver Function Tests: Recent Labs  Lab 10/08/18 1240  ALBUMIN 1.9*   No results for input(s): LIPASE, AMYLASE in the last 168 hours. No results for input(s): AMMONIA in the last 168 hours. Coagulation Profile: Recent Labs  Lab 10/04/18 1324  INR 1.0   Cardiac Enzymes: No results for input(s): CKTOTAL, CKMB, CKMBINDEX, TROPONINI in the last 168 hours. BNP (last 3 results) No results for input(s): PROBNP in the last 8760 hours. HbA1C: No results for input(s): HGBA1C in the last 72 hours. CBG: No results for input(s): GLUCAP in the last 168 hours. Lipid Profile: No results for input(s): CHOL, HDL, LDLCALC, TRIG, CHOLHDL, LDLDIRECT in the last 72  hours. Thyroid Function Tests: No results for input(s): TSH, T4TOTAL, FREET4, T3FREE, THYROIDAB in the last 72 hours. Anemia Panel: No results for input(s): VITAMINB12, FOLATE, FERRITIN, TIBC, IRON, RETICCTPCT in the last 72 hours. Sepsis Labs: No results for input(s): PROCALCITON, LATICACIDVEN in the last 168 hours.  Recent Results (from the past 240 hour(s))  SARS Coronavirus 2 (CEPHEID - Performed in [place] hospital lab), Hosp Order     Status: None   Collection Time: 10/04/18  1:24 PM  Result Value Ref Range Status   SARS Coronavirus 2 NEGATIVE NEGATIVE Final    Comment: (NOTE) If result is NEGATIVE SARS-CoV-2 target nucleic acids are NOT DETECTED. The SARS-CoV-2 RNA is generally detectable in upper and lower  respiratory specimens during the acute phase of infection. The lowest  concentration of SARS-CoV-2 viral copies this assay can detect is 250  copies / mL. A negative result does not preclude SARS-CoV-2 infection  and should not be used as the sole basis for treatment  or other  patient management decisions.  A negative result may occur with  improper specimen collection / handling, submission of specimen other  than nasopharyngeal swab, presence of viral mutation(s) within the  areas targeted by this assay, and inadequate number of viral copies  (<250 copies / mL). A negative result must be combined with clinical  observations, patient history, and epidemiological information. If result is POSITIVE SARS-CoV-2 target nucleic acids are DETECTED. The SARS-CoV-2 RNA is generally detectable in upper and lower  respiratory specimens dur ing the acute phase of infection.  Positive  results are indicative of active infection with SARS-CoV-2.  Clinical  correlation with patient history and other diagnostic information is  necessary to determine patient infection status.  Positive results do  not rule out bacterial infection or co-infection with other viruses. If result is  PRESUMPTIVE POSTIVE SARS-CoV-2 nucleic acids MAY BE PRESENT.   A presumptive positive result was obtained on the submitted specimen  and confirmed on repeat testing.  While 2019 novel coronavirus  (SARS-CoV-2) nucleic acids may be present in the submitted sample  additional confirmatory testing may be necessary for epidemiological  and / or clinical management purposes  to differentiate between  SARS-CoV-2 and other Sarbecovirus currently known to infect humans.  If clinically indicated additional testing with an alternate test  methodology [phone]) is advised. The SARS-CoV-2 RNA is generally  detectable in upper and lower respiratory sp ecimens during the acute  phase of infection. The expected result is Negative. Fact Sheet for Patients:  StrictlyIdeas.no Fact Sheet for Healthcare Providers: BankingDealers.co.za This test is not yet approved or cleared by the [place] FDA and has been authorized for detection and/or diagnosis of SARS-CoV-2 by FDA under an Emergency Use Authorization (EUA).  This EUA will remain in effect (meaning this test can be used) for the duration of the COVID-19 declaration under Section 564(b)(1) of the Act, 21 U.S.C. section 360bbb-3(b)(1), unless the authorization is terminated or revoked sooner. Performed at [facility], [place] [address], [place], [place] [address]      Radiology Studies: Dg Chest Port 1 View  Result Date: 10/08/2018 CLINICAL DATA:  Hypoxia. EXAM: PORTABLE CHEST 1 VIEW COMPARISON:  10/04/2018 FINDINGS: The cardiac silhouette is enlarged. There is a dual chamber left-sided pacemaker that is stable in appearance. There is no pneumothorax. Patient is status post remote vertebral augmentation of the midthoracic spine. The patient is rotated which limits evaluation. There is an opacity at the left lung base favored to be secondary to patient rotation. IMPRESSION: Examination is limited by  patient rotation. Given this limitation, there is no definite acute cardiopulmonary process. Opacity at the left lung base is favored to be secondary to patient rotation. Consider short interval follow-up if symptoms persist. Electronically Signed   By: Constance Holster M.D.   On: 10/08/2018 19:19    Scheduled Meds: . amiodarone  200 mg Oral Daily  . aspirin  325 mg Oral Daily  . carvedilol  3.125 mg Oral BID WC  . Chlorhexidine Gluconate Cloth  6 each Topical Q0600  . [START ON 10/11/2018] darbepoetin (ARANESP) injection - DIALYSIS  100 mcg Intravenous Q Tue-HD  . docusate sodium  100 mg Oral BID  . doxercalciferol  5 mcg Intravenous Q T,Th,Sa-HD  . feeding supplement (NEPRO CARB STEADY)  237 mL Oral BID BM  . gabapentin  100 mg Oral BID  . isosorbide mononitrate  60 mg Oral Daily  . multivitamin  1 tablet Oral QHS  .  pravastatin  40 mg Oral Daily  . sevelamer carbonate  1,600 mg Oral With snacks  . sevelamer carbonate  2,400 mg Oral TID WC  . vitamin C  500 mg Oral BID   Continuous Infusions: . methocarbamol (ROBAXIN) IV Stopped (10/06/18 0413)     LOS: 5 days   Marylu Lund, MD [facility] Pager On Amion  If 7PM-7AM, please contact night-coverage 10/09/2018, 3:33 PM

## 2018-10-09 NOTE — Patient Care Conference (Signed)
Called and updated patient's daughter over phone. All questions answered. 

## 2018-10-10 LAB — RENAL FUNCTION PANEL
Albumin: 2 g/dL — ABNORMAL LOW (ref 3.5–5.0)
Anion gap: 14 (ref 5–15)
BUN: 42 mg/dL — ABNORMAL HIGH (ref 8–23)
CO2: 24 mmol/L (ref 22–32)
Calcium: 8.6 mg/dL — ABNORMAL LOW (ref 8.9–10.3)
Chloride: 91 mmol/L — ABNORMAL LOW (ref 98–111)
Creatinine, Ser: 4.61 mg/dL — ABNORMAL HIGH (ref 0.44–1.00)
GFR calc Af Amer: 10 mL/min — ABNORMAL LOW (ref >60)
GFR calc non Af Amer: 8 mL/min — ABNORMAL LOW (ref >60)
Glucose, Bld: 97 mg/dL (ref 70–99)
Phosphorus: 2.5 mg/dL (ref 2.5–4.6)
Potassium: 4.5 mmol/L (ref 3.5–5.1)
Sodium: 129 mmol/L — ABNORMAL LOW (ref 135–145)

## 2018-10-10 LAB — CBC
HCT: 25.7 % — ABNORMAL LOW (ref 36.0–46.0)
Hemoglobin: 7.9 g/dL — ABNORMAL LOW (ref 12.0–15.0)
MCH: 30.6 pg (ref 26.0–34.0)
MCHC: 30.7 g/dL (ref 30.0–36.0)
MCV: 99.6 fL (ref 80.0–100.0)
Platelets: 253 10*3/uL (ref 150–400)
RBC: 2.58 MIL/uL — ABNORMAL LOW (ref 3.87–5.11)
RDW: 15.4 % (ref 11.5–15.5)
WBC: 9.2 10*3/uL (ref 4.0–10.5)
nRBC: 0 % (ref 0.0–0.2)

## 2018-10-10 LAB — GLUCOSE, CAPILLARY: Glucose-Capillary: 163 mg/dL — ABNORMAL HIGH (ref 70–99)

## 2018-10-10 MED ORDER — ACETAMINOPHEN 325 MG PO TABS
ORAL_TABLET | ORAL | Status: AC
  Administered 2018-10-10: 650 mg via ORAL
  Filled 2018-10-10: qty 2

## 2018-10-10 MED ORDER — LIDOCAINE-PRILOCAINE 2.5-2.5 % EX CREA
1.0000 "application " | TOPICAL_CREAM | CUTANEOUS | Status: DC | PRN
  Filled 2018-10-10: qty 5

## 2018-10-10 MED ORDER — LIDOCAINE HCL (PF) 1 % IJ SOLN: 5.0000 mL | INTRAMUSCULAR | Status: DC | PRN

## 2018-10-10 MED ORDER — SODIUM CHLORIDE 0.9 % IV SOLN: 100.0000 mL | INTRAVENOUS | Status: DC | PRN

## 2018-10-10 MED ORDER — ALTEPLASE 2 MG IJ SOLR: 2.0000 mg | Freq: Once | INTRAMUSCULAR | Status: DC | PRN

## 2018-10-10 MED ORDER — CHLORHEXIDINE GLUCONATE CLOTH 2 % EX PADS
6.0000 | MEDICATED_PAD | Freq: Every day | CUTANEOUS | Status: DC
  Administered 2018-10-10 – 2018-10-11 (×2): 6 via TOPICAL

## 2018-10-10 MED ORDER — HEPARIN SODIUM (PORCINE) 1000 UNIT/ML DIALYSIS
20.0000 [IU]/kg | INTRAMUSCULAR | Status: DC | PRN
  Filled 2018-10-10: qty 2

## 2018-10-10 MED ORDER — PENTAFLUOROPROP-TETRAFLUOROETH EX AERO: 1.0000 "application " | INHALATION_SPRAY | CUTANEOUS | Status: DC | PRN

## 2018-10-10 MED ORDER — HEPARIN SODIUM (PORCINE) 1000 UNIT/ML DIALYSIS
1000.0000 [IU] | INTRAMUSCULAR | Status: DC | PRN
  Filled 2018-10-10: qty 1

## 2018-10-10 NOTE — Care Management Important Message (Signed)
Important Message  Patient Details  Name: Valerie Terrell MRN: [id number] Date of Birth: 25-Mar-1933   Medicare Important Message Given:  Yes    Toya Palacios 10/10/2018, 2:45 PM

## 2018-10-10 NOTE — Plan of Care (Signed)
Problem: Education: Goal: Knowledge of General Education information will improve Description Including pain rating scale, medication(s)/side effects and non-pharmacologic comfort measures Outcome: Progressing   Problem: Health Behavior/Discharge Planning: Goal: Ability to manage health-related needs will improve Outcome: Progressing   Problem: Clinical Measurements: Goal: Ability to maintain clinical measurements within normal limits will improve Outcome: Progressing Goal: Respiratory complications will improve Outcome: Progressing Goal: Cardiovascular complication will be avoided Outcome: Progressing   Problem: Activity: Goal: Risk for activity intolerance will decrease Outcome: Progressing   Problem: Nutrition: Goal: Adequate nutrition will be maintained Outcome: Progressing   Problem: Coping: Goal: Level of anxiety will decrease Outcome: Progressing   Problem: Pain Managment: Goal: General experience of comfort will improve Outcome: Progressing   Problem: Skin Integrity: Goal: Risk for impaired skin integrity will decrease Outcome: Progressing

## 2018-10-10 NOTE — TOC Progression Note (Signed)
Transition of Care [facility]) - Progression Note    Patient Details  Name: Katlen Seyer MRN: [id number] Date of Birth: 1932/07/01  Transition of Care [facility]) CM/SW [place], [place] Phone Number: 10/10/2018, 1:57 PM  Clinical Narrative:    Pt and pt daughter have spoken and selected [facility].  Updated Narda Rutherford, admissions liaison. She can complete paperwork tomorrow at 1pm with family, will update pt daughter Bryson Ha.   Expected Discharge Plan: [place] Barriers to Discharge: Continued Medical Work up  Expected Discharge Plan and Services Expected Discharge Plan: [place] In-house Referral: Clinical Social Work   Post Acute Care Choice: [place] Living arrangements for the past 2 months: [place] Determinants of Health (SDOH) Interventions    Readmission Risk Interventions Readmission Risk Prevention Plan 10/06/2018  Transportation Screening Complete  Medication Review [employer]) Complete  PCP or Specialist appointment within 3-5 days of discharge Complete  HRI or Home Care Consult Complete  SW Recovery Care/Counseling Consult Complete  Palliative Care Screening Not [place] Complete  Some recent data might be hidden

## 2018-10-10 NOTE — Progress Notes (Signed)
Pt to dialysis via chair.

## 2018-10-10 NOTE — Progress Notes (Addendum)
[facility] Progress Note   Dialysis Orders:  TTS NW 4h  350/600   53kg   P4   AVG  Hep 1400 - Hectoral 50mcg IV q HD - Mircera 100 q 2 weeks (last 5/12)  Assessment/Plan: 1. Right hip fx s/p fall - s/p intramedullary implant 5/19  2. ESRD - TTS  - since she is c/o SOB and above EDW will plan on dialyzing today instead of Tuesday - if need be, can dialyze again Tuesday if SOB does not abate Also needs to be able to dialyze in a chair prior to dc to any SNF, will attempt today 3. Anemia ABLA- post op hgb drop to 6 transfused 1 unit PRBC 5/21  for ESA redose 5/26 - hgb up to 8.1 5/24 - recheck pre HD today  4. Secondary hyperparathyroidism - stable continue current meds 5. HTN/volume -  Net UF 1.6 Thursday post wt 58.6 -net UF 2 L Saturday to 57.3 still well above EDW; reviewed outpatient records - the last few weeks prior to admission she has only been getting down to 55 at the lowest and often 55 - 56 with average net UF 1 - 1.5 L - continue to gently try to whittle volume down - high risk for weight loss at present due to poor intake   6. Nutrition - renal diet/suppl added vits - encouraged nepro 7. Cards: PM for  SSS, Afib on coreg/amio, HFpEF in NSR today 8. Thrombocytopenia - platelet drop noted but back to normal now. 9. Disp - l needs SNF for rehab   Myriam Jacobson, PA-C [place] [phone] 10/10/2018,8:44 AM  LOS: 6 days   Pt seen, examined and agree w assess/plan as above with additions as indicated.  [facility] 10/10/2018, 12:32 PM     Subjective: c/o SOB, weak and feels badly - no CP - CXR 5/23 no overt volume - sats ok. Eating breakfast Objective Vitals:   10/09/18 0916 10/09/18 1443 10/09/18 1954 10/10/18 0515  BP: (!) 122/42 (!) 149/53 (!) 133/47 (!) 151/55  Pulse: 80 84 79 73  Resp:  18 18 18   Temp:  98.6 F (37 C) 98.4 F (36.9 C) (!) 97.5 F (36.4 C)  TempSrc:  Oral Oral Oral  SpO2:  100% 100%  100%  Weight:      Height:       Physical Exam General:frail elderly female sitting up in bed depressed affect. sats 97% on 2 liters - doesn't use O2 at home Heart: RRR Lungs: dim BS very poor effort Abdomen: soft NTND Extremities: no LE edema bandage right hip Dialysis Access:  Right upper AVGG + bruit   Additional Objective Labs: Basic Metabolic Panel: Recent Labs  Lab 10/04/18 1324 10/05/18 0346 10/08/18 1240  NA 142 139 134*  K 3.8 5.3* 3.8  CL 99 95* 92*  CO2 27 26 27   GLUCOSE 115* 142* 118*  BUN 23 32* 40*  CREATININE 6.55* 7.23* 4.84*  CALCIUM 8.4* 8.3* 8.0*  PHOS  --   --  3.0   Liver Function Tests: Recent Labs  Lab 10/08/18 1240  ALBUMIN 1.9*   No results for input(s): LIPASE, AMYLASE in the last 168 hours. CBC: Recent Labs  Lab 10/04/18 1324 10/05/18 0346 10/06/18 0225 10/06/18 1147 10/08/18 0821 10/09/18 0410  WBC 8.6 7.9 11.6*  --  9.7 9.1  NEUTROABS 6.7  --   --   --   --   --  HGB 10.6* 8.8* 6.1* 7.4* 7.5* 8.1*  HCT 34.6* 28.2* 19.7* 23.6* 24.1* 26.1*  MCV 105.5* 103.7* 103.1*  --  99.2 99.6  PLT 171 163 129*  --  147* 178  Iron Studies: No results for input(s): IRON, TIBC, TRANSFERRIN, FERRITIN in the last 72 hours. Studies/Results: Dg Chest Port 1 View  Result Date: 10/08/2018 CLINICAL DATA:  Hypoxia. EXAM: PORTABLE CHEST 1 VIEW COMPARISON:  10/04/2018 FINDINGS: The cardiac silhouette is enlarged. There is a dual chamber left-sided pacemaker that is stable in appearance. There is no pneumothorax. Patient is status post remote vertebral augmentation of the midthoracic spine. The patient is rotated which limits evaluation. There is an opacity at the left lung base favored to be secondary to patient rotation. IMPRESSION: Examination is limited by patient rotation. Given this limitation, there is no definite acute cardiopulmonary process. Opacity at the left lung base is favored to be secondary to patient rotation. Consider short interval  follow-up if symptoms persist. Electronically Signed   By: Constance Holster M.D.   On: 10/08/2018 19:19   Medications: . methocarbamol (ROBAXIN) IV Stopped (10/06/18 0413)   . amiodarone  200 mg Oral Daily  . aspirin  325 mg Oral Daily  . carvedilol  3.125 mg Oral BID WC  . Chlorhexidine Gluconate Cloth  6 each Topical Q0600  . [START ON 10/11/2018] darbepoetin (ARANESP) injection - DIALYSIS  100 mcg Intravenous Q Tue-HD  . docusate sodium  100 mg Oral BID  . doxercalciferol  5 mcg Intravenous Q T,Th,Sa-HD  . feeding supplement (NEPRO CARB STEADY)  237 mL Oral BID BM  . gabapentin  100 mg Oral BID  . isosorbide mononitrate  60 mg Oral Daily  . multivitamin  1 tablet Oral QHS  . pravastatin  40 mg Oral Daily  . sevelamer carbonate  1,600 mg Oral With snacks  . sevelamer carbonate  2,400 mg Oral TID WC  . vitamin C  500 mg Oral BID

## 2018-10-10 NOTE — Progress Notes (Signed)
Pt complaining of shortness of breath. Pt taking shallow respirations. Pt on 2L O2. Alric Seton, PA at bedside. Will continue to monitor.

## 2018-10-10 NOTE — Progress Notes (Signed)
PROGRESS NOTE    Valerie Terrell  JEH:[id number] DOB: August 10, 1932 DOA: 10/04/2018 PCP: Tammi Sou, MD    Brief Narrative:  85 y.o. female with medical history significant of CVA; rib and compression fractures; pacemaker; afib off AC due to falls; HTN; HLD; ESRD on TTS HD; chronic O2 dependence; and chronic combined CHF presenting with hip pain.  She was getting ready for HD and has 3 steps from the kitchen to the garage.  She didn't step well and lost her balance.  She landed on her right hip and experienced excruciating pain.  Assessment & Plan:   Principal Problem:   Hip fracture ([place]) Active Problems:   PAF (paroxysmal atrial fibrillation) (HCC)   Essential hypertension   Cardiac pacemaker   Chronic combined systolic and diastolic heart failure (HCC)   ESRD (end stage renal disease) on dialysis [facility])   Chronic pain syndrome   Hip fracture -Mechanical fall resulting in hip fracture -Orthopedics following, s/p surgery 5/19 -Continue analgesic as needed -PT consulted with recommendation for SNF. Pt seems more agreeable with SNF as patient is requiring max assist per PT -Planning SNF placement. SW is following  Afib, not on AC -Rate controlled with Amiodarone, Coreg and has pacemaker -Reportedly no longer taking anticoagulation -Remains stable and present  HTN -Continue Coreg as tolerated -Noted to be hypotensive while on HD recently -BP remains stable at this time  Chronic combined CHF -Appears to be compensated at this time -7/19 Echo with preserved EF and grade 2 diastolic dysfunction -continue fluid management with HD. Tolerating HD thus far  ESRD on HD -Patient on chronic TTS HD -Nephrology consulted -Discussed with Nephrology. Tolerating dialysis  Chronic pain syndrome -Takes Vicodin and Percocet for pain at home -Patient seems more lethargic/encephalopathic this AM -Have reduced opioid dosing, d/c vicodin, have decreased neurontin dose -Stable this  AM. Cont to minimize analgesic as patient tolerates -Stable at this time  Acute toxic metabolic encephalopathy -Noted to be more confused while on HD recently -per staff, mentation worsened after taking post-op analgesic -Weaned sedating meds. Normal mentation this AM  Acute blood loss anemia -Hgb recently down to 6 range, appropriately improved with one unit PRBC -Repeat cbc stable with hgb of 7.5. Labs reviewed -Suspect blood loss secondary to recent surgery. Have held ASA for now  Constipation -Miralax and cathartics as needed -Resolved. Had BM this AM  DVT prophylaxis: SCD's Code Status: Full Family Communication: Pt in room, family not at bedside Disposition Plan: Possible SNF, timing uncertain  Consultants:   Orthopedic Surgery  Nephrology  Procedures:  Treatment of intertrochanteric, pertrochanteric, subtrochanteric fracture with intramedullary implant  Antimicrobials: Anti-infectives (From admission, onward)   Start     Dose/Rate Route Frequency Ordered Stop   10/04/18 1745  ceFAZolin (ANCEF) IVPB 2g/100 mL premix     2 g 200 mL/hr over 30 Minutes Intravenous On call to O.R. 10/04/18 1656 10/04/18 1805   10/04/18 1657  ceFAZolin (ANCEF) 2-4 GM/100ML-% IVPB    Note to Pharmacy:  Henrine Screws   : cabinet override      10/04/18 1657 10/04/18 1805      Subjective: More alert this AM. Wanting to use bedpan this AM  Objective: Vitals:   10/09/18 1443 10/09/18 1954 10/10/18 0515 10/10/18 0847  BP: (!) 149/53 (!) 133/47 (!) 151/55 (!) 165/58  Pulse: 84 79 73 77  Resp: 18 18 18    Temp: 98.6 F (37 C) 98.4 F (36.9 C) (!) 97.5 F (36.4 C)  TempSrc: Oral Oral Oral   SpO2: 100% 100% 100% 99%  Weight:      Height:        Intake/Output Summary (Last 24 hours) at 10/10/2018 1539 Last data filed at 10/10/2018 1012 Gross per 24 hour  Intake 540 ml  Output 50 ml  Net 490 ml   Filed Weights   10/06/18 1506 10/08/18 1205 10/08/18 1614  Weight: 58.6 kg 60.2  kg 57.3 kg    Examination: General exam: Awake,sitting in chair, in nad Respiratory system: Normal respiratory effort, no wheezing Cardiovascular system: regular rate, s1, s2 Gastrointestinal system: Soft, nondistended, positive BS Central nervous system: CN2-12 grossly intact, strength intact Extremities: Perfused, no clubbing Skin: Normal skin turgor, no notable skin lesions seen Psychiatry: Mood normal // no visual hallucinations   Data Reviewed: I have personally reviewed following labs and imaging studies  CBC: Recent Labs  Lab 10/04/18 1324 10/05/18 0346 10/06/18 0225 10/06/18 1147 10/08/18 0821 10/09/18 0410 10/10/18 1338  WBC 8.6 7.9 11.6*  --  9.7 9.1 9.2  NEUTROABS 6.7  --   --   --   --   --   --   HGB 10.6* 8.8* 6.1* 7.4* 7.5* 8.1* 7.9*  HCT 34.6* 28.2* 19.7* 23.6* 24.1* 26.1* 25.7*  MCV 105.5* 103.7* 103.1*  --  99.2 99.6 99.6  PLT 171 163 129*  --  147* 178 767   Basic Metabolic Panel: Recent Labs  Lab 10/04/18 1324 10/05/18 0346 10/08/18 1240 10/10/18 1442  NA 142 139 134* 129*  K 3.8 5.3* 3.8 4.5  CL 99 95* 92* 91*  CO2 27 26 27 24   GLUCOSE 115* 142* 118* 97  BUN 23 32* 40* 42*  CREATININE 6.55* 7.23* 4.84* 4.61*  CALCIUM 8.4* 8.3* 8.0* 8.6*  PHOS  --   --  3.0 2.5   GFR: Estimated Creatinine Clearance: 7.1 mL/min (A) (by C-G formula based on SCr of 4.61 mg/dL (H)). Liver Function Tests: Recent Labs  Lab 10/08/18 1240 10/10/18 1442  ALBUMIN 1.9* 2.0*   No results for input(s): LIPASE, AMYLASE in the last 168 hours. No results for input(s): AMMONIA in the last 168 hours. Coagulation Profile: Recent Labs  Lab 10/04/18 1324  INR 1.0   Cardiac Enzymes: No results for input(s): CKTOTAL, CKMB, CKMBINDEX, TROPONINI in the last 168 hours. BNP (last 3 results) No results for input(s): PROBNP in the last 8760 hours. HbA1C: No results for input(s): HGBA1C in the last 72 hours. CBG: No results for input(s): GLUCAP in the last 168  hours. Lipid Profile: No results for input(s): CHOL, HDL, LDLCALC, TRIG, CHOLHDL, LDLDIRECT in the last 72 hours. Thyroid Function Tests: No results for input(s): TSH, T4TOTAL, FREET4, T3FREE, THYROIDAB in the last 72 hours. Anemia Panel: No results for input(s): VITAMINB12, FOLATE, FERRITIN, TIBC, IRON, RETICCTPCT in the last 72 hours. Sepsis Labs: No results for input(s): PROCALCITON, LATICACIDVEN in the last 168 hours.  Recent Results (from the past 240 hour(s))  SARS Coronavirus 2 (CEPHEID - Performed in [place] hospital lab), Hosp Order     Status: None   Collection Time: 10/04/18  1:24 PM  Result Value Ref Range Status   SARS Coronavirus 2 NEGATIVE NEGATIVE Final    Comment: (NOTE) If result is NEGATIVE SARS-CoV-2 target nucleic acids are NOT DETECTED. The SARS-CoV-2 RNA is generally detectable in upper and lower  respiratory specimens during the acute phase of infection. The lowest  concentration of SARS-CoV-2 viral copies this assay can detect is 250  copies / mL. A negative result does not preclude SARS-CoV-2 infection  and should not be used as the sole basis for treatment or other  patient management decisions.  A negative result may occur with  improper specimen collection / handling, submission of specimen other  than nasopharyngeal swab, presence of viral mutation(s) within the  areas targeted by this assay, and inadequate number of viral copies  (<250 copies / mL). A negative result must be combined with clinical  observations, patient history, and epidemiological information. If result is POSITIVE SARS-CoV-2 target nucleic acids are DETECTED. The SARS-CoV-2 RNA is generally detectable in upper and lower  respiratory specimens dur ing the acute phase of infection.  Positive  results are indicative of active infection with SARS-CoV-2.  Clinical  correlation with patient history and other diagnostic information is  necessary to determine patient infection status.   Positive results do  not rule out bacterial infection or co-infection with other viruses. If result is PRESUMPTIVE POSTIVE SARS-CoV-2 nucleic acids MAY BE PRESENT.   A presumptive positive result was obtained on the submitted specimen  and confirmed on repeat testing.  While 2019 novel coronavirus  (SARS-CoV-2) nucleic acids may be present in the submitted sample  additional confirmatory testing may be necessary for epidemiological  and / or clinical management purposes  to differentiate between  SARS-CoV-2 and other Sarbecovirus currently known to infect humans.  If clinically indicated additional testing with an alternate test  methodology [id number]) is advised. The SARS-CoV-2 RNA is generally  detectable in upper and lower respiratory sp ecimens during the acute  phase of infection. The expected result is Negative. Fact Sheet for Patients:  StrictlyIdeas.no Fact Sheet for Healthcare Providers: BankingDealers.co.za This test is not yet approved or cleared by the [place] FDA and has been authorized for detection and/or diagnosis of SARS-CoV-2 by FDA under an Emergency Use Authorization (EUA).  This EUA will remain in effect (meaning this test can be used) for the duration of the COVID-19 declaration under Section 564(b)(1) of the Act, 21 U.S.C. section 360bbb-3(b)(1), unless the authorization is terminated or revoked sooner. Performed at [facility], [place] [address], [place], [place] [address]      Radiology Studies: Dg Chest Port 1 View  Result Date: 10/08/2018 CLINICAL DATA:  Hypoxia. EXAM: PORTABLE CHEST 1 VIEW COMPARISON:  10/04/2018 FINDINGS: The cardiac silhouette is enlarged. There is a dual chamber left-sided pacemaker that is stable in appearance. There is no pneumothorax. Patient is status post remote vertebral augmentation of the midthoracic spine. The patient is rotated which limits evaluation. There is an opacity  at the left lung base favored to be secondary to patient rotation. IMPRESSION: Examination is limited by patient rotation. Given this limitation, there is no definite acute cardiopulmonary process. Opacity at the left lung base is favored to be secondary to patient rotation. Consider short interval follow-up if symptoms persist. Electronically Signed   By: Constance Holster M.D.   On: 10/08/2018 19:19    Scheduled Meds: . amiodarone  200 mg Oral Daily  . aspirin  325 mg Oral Daily  . carvedilol  3.125 mg Oral BID WC  . Chlorhexidine Gluconate Cloth  6 each Topical Q0600  . [START ON 10/11/2018] darbepoetin (ARANESP) injection - DIALYSIS  100 mcg Intravenous Q Tue-HD  . docusate sodium  100 mg Oral BID  . doxercalciferol  5 mcg Intravenous Q T,Th,Sa-HD  . feeding supplement (NEPRO CARB STEADY)  237 mL Oral BID BM  . gabapentin  100 mg Oral BID  . isosorbide mononitrate  60 mg Oral Daily  . multivitamin  1 tablet Oral QHS  . pravastatin  40 mg Oral Daily  . sevelamer carbonate  1,600 mg Oral With snacks  . sevelamer carbonate  2,400 mg Oral TID WC  . vitamin C  500 mg Oral BID   Continuous Infusions: . sodium chloride    . sodium chloride    . methocarbamol (ROBAXIN) IV Stopped (10/06/18 0413)     LOS: 6 days   Marylu Lund, MD [facility] Pager On Amion  If 7PM-7AM, please contact night-coverage 10/10/2018, 3:39 PM

## 2018-10-10 NOTE — TOC Progression Note (Signed)
Transition of Care [facility]) - Progression Note    Patient Details  Name: Valerie Terrell MRN: [id number] Date of Birth: 04/29/1933  Transition of Care [facility]) CM/SW [place], [place] Phone Number: 10/10/2018, 10:59 AM  Clinical Narrative:    Spoke with pt daughter [name], confirmed email and sent offers along with CMS ratings to [email or web address]   Continuing to follow.   Expected Discharge Plan: [place] Barriers to Discharge: Continued Medical Work up  Expected Discharge Plan and Services Expected Discharge Plan: [place] In-house Referral: Clinical Social Work   Post Acute Care Choice: [place] Living arrangements for the past 2 months: [place] Determinants of Health (SDOH) Interventions    Readmission Risk Interventions Readmission Risk Prevention Plan 10/06/2018  Transportation Screening Complete  Medication Review [employer]) Complete  PCP or Specialist appointment within 3-5 days of discharge Complete  HRI or Home Care Consult Complete  SW Recovery Care/Counseling Consult Complete  Palliative Care Screening Not [place] Complete  Some recent data might be hidden

## 2018-10-10 NOTE — Progress Notes (Signed)
Physical Therapy Treatment Patient Details Name: Valerie Terrell MRN: [id number] DOB: 1932-08-10 Today's Date: 10/10/2018    History of Present Illness Pt is an 85 y.o. female admitted 10/04/18 after falling at home sustaining R hip fx. S/p R intertrochanteric IM nail 5/19. PMH includes ESRD (HD TTS), CHF, CVA, pacemaker, CHF, O2 dependence, compression fxs.    PT Comments    Pt tolerated sitting position in stedy to get cleaned up. Mod A +2 to stand from bed, min A +2 to stand from stedy. Performed multiple times, pt fatigued after 3rd trial. LE there ex performed in chair. Pt with expiratory wheeze at rest and with activity. PT will continue to follow.    Follow Up Recommendations  SNF;Supervision/Assistance - 24 hour     Equipment Recommendations  None recommended by PT    Recommendations for Other Services       Precautions / Restrictions Precautions Precautions: Fall Restrictions Weight Bearing Restrictions: Yes RLE Weight Bearing: Weight bearing as tolerated    Mobility  Bed Mobility Overal bed mobility: Needs Assistance Bed Mobility: Supine to Sit     Supine to sit: HOB elevated;Mod assist     General bed mobility comments: pt able to reach across body with LUE and grasp R rail. Mod A at LE's and trunk to achieve upright posture  Transfers Overall transfer level: Needs assistance   Transfers: Sit to/from Stand;Stand Pivot Transfers Sit to Stand: Mod assist;+2 physical assistance Stand pivot transfers: Total assist       General transfer comment: mod A +2 for sit to stand from bed, min A +2 for multiple stands from stedy  Ambulation/Gait             General Gait Details: pt unable to step feet or achieve full upright posture   Stairs             Wheelchair Mobility    Modified Rankin (Stroke Patients Only)       Balance Overall balance assessment: Needs assistance Sitting-balance support: Feet supported Sitting balance-Leahy Scale:  Fair Sitting balance - Comments: supervision in sitting but able to sit without physical assist   Standing balance support: Bilateral upper extremity supported Standing balance-Leahy Scale: Poor Standing balance comment: heavy fwd lean onto stedy and had difficulty following commands to stand upright                            Cognition Arousal/Alertness: Awake/alert Behavior During Therapy: Flat affect Overall Cognitive Status: Impaired/Different from baseline Area of Impairment: Following commands;Problem solving                   Current Attention Level: Sustained Memory: Decreased short-term memory;Decreased recall of precautions Following Commands: Follows one step commands with increased time     Problem Solving: Slow processing;Decreased initiation;Difficulty sequencing;Requires verbal cues;Requires tactile cues General Comments: no confusion noted today but pt continues to be slow to respond and needs simple commands. Also demonstrates decreased awareness of body positioning      Exercises General Exercises - Lower Extremity Ankle Circles/Pumps: AROM;Right;10 reps;Seated Quad Sets: AROM;Both;10 reps;Seated Long Arc Quad: AAROM;10 reps;Seated;Both Heel Slides: AAROM;Both;10 reps;Seated Hip ABduction/ADduction: AAROM;Right;10 reps;Seated    General Comments General comments (skin integrity, edema, etc.): 4L O2 [place], pulse ox would not read on pt's finger, no increased SOB with activity      Pertinent Vitals/Pain Pain Assessment: Faces Faces Pain Scale: Hurts little more Pain Location: RLE Pain Descriptors /  Indicators: Grimacing;Guarding;Moaning Pain Intervention(s): Limited activity within patient's tolerance;Monitored during session    Home Living                      Prior Function            PT Goals (current goals can now be found in the care plan section) Acute Rehab PT Goals Patient Stated Goal: Get out of this bed; be able to  eat without shaking PT Goal Formulation: With patient Time For Goal Achievement: 10/20/18 Potential to Achieve Goals: Fair Progress towards PT goals: Progressing toward goals    Frequency    Min 3X/week      PT Plan Current plan remains appropriate    Co-evaluation              AM-PAC PT "6 Clicks" Mobility   Outcome Measure  Help needed turning from your back to your side while in a flat bed without using bedrails?: A Lot Help needed moving from lying on your back to sitting on the side of a flat bed without using bedrails?: A Lot Help needed moving to and from a bed to a chair (including a wheelchair)?: A Lot Help needed standing up from a chair using your arms (e.g., wheelchair or bedside chair)?: A Lot Help needed to walk in hospital room?: Total Help needed climbing 3-5 steps with a railing? : Total 6 Click Score: 10    End of Session Equipment Utilized During Treatment: Gait belt;Oxygen Activity Tolerance: Patient tolerated treatment well Patient left: with call bell/phone within reach;in chair;with chair alarm set Nurse Communication: Mobility status;Need for lift equipment PT Visit Diagnosis: Other abnormalities of gait and mobility (R26.89);Muscle weakness (generalized) (M62.81);Pain Pain - Right/Left: Right Pain - part of body: Hip     Time: 3559-7416 PT Time Calculation (min) (ACUTE ONLY): 23 min  Charges:  $Therapeutic Exercise: 8-22 mins $Therapeutic Activity: 8-22 mins                     Leighton Roach, PT  Acute Rehab Services  Pager [phone] Office [place] 10/10/2018, 12:56 PM

## 2018-10-10 NOTE — Plan of Care (Signed)
  Problem: Education: Goal: Knowledge of General Education information will improve Description: Including pain rating scale, medication(s)/side effects and non-pharmacologic comfort measures Outcome: Progressing   Problem: Pain Managment: Goal: General experience of comfort will improve Outcome: Progressing   Problem: Skin Integrity: Goal: Risk for impaired skin integrity will decrease Outcome: Progressing   

## 2018-10-10 NOTE — Progress Notes (Signed)
Pre-dialysis report called and given to [facility], RN in HD. All questions answered to satisfaction. Awaiting transportation. Will continue to monitor.

## 2018-10-10 NOTE — Progress Notes (Signed)
Pt returned to room 6N29 after dialysis. Received report from [place], Therapist, sports. Will continue to monitor.

## 2018-10-10 NOTE — Patient Care Conference (Signed)
Called and updated patient's daughter over phone. All questions answered. 

## 2018-10-11 DIAGNOSIS — R278 Other lack of coordination: Secondary | ICD-10-CM | POA: Diagnosis not present

## 2018-10-11 DIAGNOSIS — D62 Acute posthemorrhagic anemia: Secondary | ICD-10-CM | POA: Diagnosis not present

## 2018-10-11 DIAGNOSIS — N2581 Secondary hyperparathyroidism of renal origin: Secondary | ICD-10-CM | POA: Diagnosis not present

## 2018-10-11 DIAGNOSIS — S72001A Fracture of unspecified part of neck of right femur, initial encounter for closed fracture: Secondary | ICD-10-CM | POA: Diagnosis not present

## 2018-10-11 DIAGNOSIS — R52 Pain, unspecified: Secondary | ICD-10-CM | POA: Diagnosis not present

## 2018-10-11 DIAGNOSIS — I110 Hypertensive heart disease with heart failure: Secondary | ICD-10-CM | POA: Diagnosis not present

## 2018-10-11 DIAGNOSIS — S72141S Displaced intertrochanteric fracture of right femur, sequela: Secondary | ICD-10-CM | POA: Diagnosis not present

## 2018-10-11 DIAGNOSIS — D631 Anemia in chronic kidney disease: Secondary | ICD-10-CM | POA: Diagnosis not present

## 2018-10-11 DIAGNOSIS — I4891 Unspecified atrial fibrillation: Secondary | ICD-10-CM | POA: Diagnosis not present

## 2018-10-11 DIAGNOSIS — G9341 Metabolic encephalopathy: Secondary | ICD-10-CM | POA: Diagnosis not present

## 2018-10-11 DIAGNOSIS — E876 Hypokalemia: Secondary | ICD-10-CM | POA: Diagnosis not present

## 2018-10-11 DIAGNOSIS — I480 Paroxysmal atrial fibrillation: Secondary | ICD-10-CM | POA: Diagnosis not present

## 2018-10-11 DIAGNOSIS — M2550 Pain in unspecified joint: Secondary | ICD-10-CM | POA: Diagnosis not present

## 2018-10-11 DIAGNOSIS — G894 Chronic pain syndrome: Secondary | ICD-10-CM | POA: Diagnosis not present

## 2018-10-11 DIAGNOSIS — I5042 Chronic combined systolic (congestive) and diastolic (congestive) heart failure: Secondary | ICD-10-CM | POA: Diagnosis not present

## 2018-10-11 DIAGNOSIS — I132 Hypertensive heart and chronic kidney disease with heart failure and with stage 5 chronic kidney disease, or end stage renal disease: Secondary | ICD-10-CM | POA: Diagnosis not present

## 2018-10-11 DIAGNOSIS — Z992 Dependence on renal dialysis: Secondary | ICD-10-CM | POA: Diagnosis not present

## 2018-10-11 DIAGNOSIS — E8770 Fluid overload, unspecified: Secondary | ICD-10-CM | POA: Diagnosis not present

## 2018-10-11 DIAGNOSIS — S72101D Unspecified trochanteric fracture of right femur, subsequent encounter for closed fracture with routine healing: Secondary | ICD-10-CM | POA: Diagnosis not present

## 2018-10-11 DIAGNOSIS — I959 Hypotension, unspecified: Secondary | ICD-10-CM | POA: Diagnosis not present

## 2018-10-11 DIAGNOSIS — N186 End stage renal disease: Secondary | ICD-10-CM | POA: Diagnosis not present

## 2018-10-11 DIAGNOSIS — R2689 Other abnormalities of gait and mobility: Secondary | ICD-10-CM | POA: Diagnosis not present

## 2018-10-11 DIAGNOSIS — Z7401 Bed confinement status: Secondary | ICD-10-CM | POA: Diagnosis not present

## 2018-10-11 DIAGNOSIS — Z950 Presence of cardiac pacemaker: Secondary | ICD-10-CM | POA: Diagnosis not present

## 2018-10-11 DIAGNOSIS — I5030 Unspecified diastolic (congestive) heart failure: Secondary | ICD-10-CM | POA: Diagnosis not present

## 2018-10-11 DIAGNOSIS — R5381 Other malaise: Secondary | ICD-10-CM | POA: Diagnosis not present

## 2018-10-11 DIAGNOSIS — D689 Coagulation defect, unspecified: Secondary | ICD-10-CM | POA: Diagnosis not present

## 2018-10-11 MED ORDER — ASPIRIN 325 MG PO TABS: 325.0000 mg | ORAL_TABLET | Freq: Every day | ORAL | 0 refills | Status: AC

## 2018-10-11 MED ORDER — CHLORHEXIDINE GLUCONATE CLOTH 2 % EX PADS: 6.0000 | MEDICATED_PAD | Freq: Every day | CUTANEOUS | Status: DC

## 2018-10-11 MED ORDER — OXYCODONE HCL 10 MG PO TABS: 10.0000 mg | ORAL_TABLET | ORAL | 0 refills | Status: DC | PRN

## 2018-10-11 NOTE — Progress Notes (Signed)
This RN spoke to [employer], [place] concerning new dialysis orders though pt is to be d/c today. Per Joellen [place], [place] pt okay to d/c without dialysis since pt had dialysis yesterday 10/10/2018. Will continue to monitor.

## 2018-10-11 NOTE — Progress Notes (Signed)
AVS, social worker's paperwork, and printed prescription placed in discharge packet. Report called and given to [facility], Therapist, sports at [employer]. All questions answered to satisfaction. Awaiting PTAR for transportation.

## 2018-10-11 NOTE — Progress Notes (Signed)
Pt's daughter, [name], picked up pt's electric toothbrush and toothpaste.

## 2018-10-11 NOTE — TOC Transition Note (Signed)
Transition of Care [facility] - CM/SW Discharge Note   Patient Details  Name: Valerie Terrell MRN: [id number] Date of Birth: 1933-02-01  Transition of Care [facility]) CM/SW Contact:  Alexander Mt, [place] Phone Number: 10/11/2018, 1:48 PM   Clinical Narrative:    Pt has bed available at [facility], spoke with RN and she will check to ensure script is signed. Pt PTAR papers on chart and daughter has completed paperwork.   Will alert her of discharge.   Final next level of care: Skilled Nursing Facility Barriers to Discharge: Barriers Resolved   Patient Goals and CMS Choice Patient states their goals for this hospitalization and ongoing recovery are:: for her to be cared for CMS Medicare.gov Compare Post Acute Care list provided to:: Patient Represenative (must comment) Choice offered to / list presented to : Patient, Adult Children  Discharge Placement   Existing PASRR number confirmed : 10/06/18          Patient chooses bed at: [facility] Patient to be transferred to facility by: [place] Name of family member notified: pt daughter [name] Patient and family notified of of transfer: 10/11/18  Discharge Plan and Services In-house Referral: Clinical Social Work   Post Acute Care Choice: [place]                               Social Determinants of Health (SDOH) Interventions     Readmission Risk Interventions Readmission Risk Prevention Plan 10/06/2018  Transportation Screening Complete  Medication Review Press photographer) Complete  PCP or Specialist appointment within 3-5 days of discharge Complete  HRI or Home Care Consult Complete  SW Recovery Care/Counseling Consult Complete  Palliative Care Screening Not [place] Complete  Some recent data might be hidden

## 2018-10-11 NOTE — Discharge Summary (Signed)
Physician Discharge Summary  Valerie Terrell FYB:[id number] DOB: [birth year] DOA: 10/04/2018  PCP: Tammi Sou, MD  Admit date: 10/04/2018 Discharge date: 10/11/2018  Admitted From: Home Disposition:  SNF  Recommendations for Outpatient Follow-up:  1. Follow up with PCP in 1-2 weeks 2. Continue with TTS dialysis, next session for 5/28 3. Wean O2 as tolerated with goal of room air  Discharge Condition:Improved CODE STATUS:Full Diet recommendation: Renal   Brief/Interim Summary: 85 y.o.femalewith medical history significant ofCVA; rib and compression fractures; pacemaker; afib off AC due to falls; HTN; HLD; ESRD on TTS HD; chronic O2 dependence; and chronic combined CHF presenting with hip pain.She was getting ready for HD and has 3 steps from the kitchen to the garage. She didn't step well and lost her balance. She landed on her right hip and experienced excruciating pain.  Discharge Diagnoses:  Principal Problem:   Hip fracture ([place]) Active Problems:   PAF (paroxysmal atrial fibrillation) (HCC)   Essential hypertension   Cardiac pacemaker   Chronic combined systolic and diastolic heart failure (HCC)   ESRD (end stage renal disease) on dialysis [facility])   Chronic pain syndrome  Hip fracture -Mechanical fall resulting in hip fracture -Orthopedics following, s/p surgery 5/19 -Continue analgesic as needed -PT consulted with recommendation for SNF. Pt seems more agreeable with SNF as patient is requiring max assist per PT -Planning SNF placement today  Afib, not on AC -Rate controlled with Amiodarone, Coreg and has pacemaker -Reportedly no longer taking anticoagulation -Remains stable and present  HTN -Continue Coreg as tolerated -Noted to be hypotensive while on HD recently -BP remains stable at this time  Chronic combined CHF -Appears to be compensated at this time -7/19 Echo with preserved EF and grade 2 diastolic dysfunction -continue fluid management with  HD. Tolerating HD thus far.  ESRD on HD -Patient on chronic TTS HD -Nephrology consulted -Discussed with Nephrology. Tolerating dialysis. Next HD session on 5/28  Chronic pain syndrome -Takes Vicodin and Percocet for pain at home -Patient seems more lethargic/encephalopathic this AM -Have reduced opioid dosing, d/c vicodin, have decreased neurontin dose -Stable this AM. Cont to minimize analgesic as patient tolerates -Stable at this time  Acute toxic metabolic encephalopathy -Noted to be more confused while on HD recently -per staff, mentation worsened after taking post-op analgesic -Weaned sedating meds. Normal mentation this AM  Acute blood loss anemia -Hgb recently down to 6 range, appropriately improved with one unit PRBC -Repeat cbc stable with hgb of 7.5. Labs reviewed -Suspect blood loss secondary to recent surgery. Have held ASA for now  Constipation -Miralax and cathartics as needed -Resolved.    Discharge Instructions   Allergies as of 10/11/2018      Reactions   Clonidine Derivatives Palpitations, Other (See Comments)   VERY SEDATED   Adhesive [tape] Other (See Comments)   Tears skin up   Codeine Nausea Only   Nickel Rash   Sulfa Antibiotics Other (See Comments)   Reaction unknown      Medication List    STOP taking these medications   HYDROcodone-acetaminophen 5-325 MG tablet Commonly known as:  NORCO/VICODIN     TAKE these medications   acetaminophen 500 MG tablet Commonly known as:  TYLENOL Take 500-1,000 mg 3 (three) times daily as needed by mouth for moderate pain.   amiodarone 200 MG tablet Commonly known as:  PACERONE TAKE 1 TABLET BY MOUTH  DAILY   aspirin 325 MG tablet Take 1 tablet (325 mg total) by mouth daily.  Start taking on:  Oct 12, 2018   b complex-vitamin c-folic acid 0.8 MG Tabs tablet Take 1 tablet by mouth daily.   carvedilol 3.125 MG tablet Commonly known as:  COREG TAKE 1 TABLET BY MOUTH TWO  TIMES DAILY What  changed:  when to take this   docusate sodium 100 MG capsule Commonly known as:  COLACE Take 100 mg by mouth daily.   isosorbide mononitrate 60 MG 24 hr tablet Commonly known as:  IMDUR TAKE 1 TABLET BY MOUTH EVERY DAY   multivitamin with minerals tablet Take 1 tablet by mouth daily.   ondansetron 4 MG tablet Commonly known as:  ZOFRAN Take 1 tablet (4 mg total) by mouth every 8 (eight) hours as needed for nausea or vomiting.   Oxycodone HCl 10 MG Tabs Take 1 tablet (10 mg total) by mouth every 4 (four) hours as needed for up to 7 days (for severe pain). What changed:    medication strength  how much to take  reasons to take this   polyvinyl alcohol 1.4 % ophthalmic solution Commonly known as:  LIQUIFILM TEARS Place 1-2 drops into both eyes as needed for dry eyes.   pravastatin 40 MG tablet Commonly known as:  PRAVACHOL TAKE 1 TABLET BY MOUTH  EVERY EVENING What changed:  when to take this   sevelamer carbonate 800 MG tablet Commonly known as:  RENVELA Take 1,600-2,400 mg by mouth See admin instructions. 2400 mg with meals and 1600 with snack   vitamin C 500 MG tablet Commonly known as:  ASCORBIC ACID Take 500 mg by mouth daily.   Vitamin D (Ergocalciferol) 1.25 MG (50000 UT) Caps capsule Commonly known as:  DRISDOL Take 1 capsule (50,000 Units total) by mouth every Thursday.       Contact information for follow-up providers    Nicholes Stairs, MD In 2 weeks.   Specialty:  Orthopedic Surgery Why:  For suture removal, For wound re-check Contact information: [address] [place] [address] [phone]            Contact information for after-discharge care    Destination    [facility] Preferred SNF .   Service:  Skilled Nursing Contact information: [place] [phone]                 Allergies  Allergen Reactions  . Clonidine Derivatives  Palpitations and Other (See Comments)    VERY SEDATED  . Adhesive [Tape] Other (See Comments)    Tears skin up  . Codeine Nausea Only  . Nickel Rash  . Sulfa Antibiotics Other (See Comments)    Reaction unknown    Consultations:  Orthopedic Surgery  Nephrology  Procedures/Studies: Dg Chest 1 View  Result Date: 10/04/2018 CLINICAL DATA:  Pain status post fall EXAM: CHEST  1 VIEW COMPARISON:  07/18/2018 FINDINGS: The cardiac silhouette remains enlarged. There is a stable dual chamber left-sided pacemaker in place. There is no pneumothorax. No large pleural effusion. There are few calcified granulomas bilaterally. Old right-sided rib fractures are again seen. The patient is status post remote vertebral augmentation. Aortic calcifications are noted. IMPRESSION: No active disease. Electronically Signed   By: Constance Holster M.D.   On: 10/04/2018 14:28   Ct Head Wo Contrast  Result Date: 10/04/2018 CLINICAL DATA:  Fall onto right hip. EXAM: CT HEAD WITHOUT CONTRAST CT CERVICAL SPINE WITHOUT CONTRAST TECHNIQUE: Multidetector CT imaging of the head and cervical spine was  performed following the standard protocol without intravenous contrast. Multiplanar CT image reconstructions of the cervical spine were also generated. COMPARISON:  CT head dated July 28, 2017. CT cervical spine dated December 31, 2016. FINDINGS: CT HEAD FINDINGS Brain: No evidence of acute infarction, hemorrhage, hydrocephalus, extra-axial collection or mass lesion/mass effect. Stable atrophy and chronic microvascular ischemic changes. Vascular: Calcified atherosclerosis at the skullbase. No hyperdense vessel. Skull: Negative for fracture or focal lesion. Sinuses/Orbits: No acute finding. Other: None. CT CERVICAL SPINE FINDINGS Alignment: Normal. Skull base and vertebrae: No acute fracture. No primary bone lesion or focal pathologic process. Soft tissues and spinal canal: No prevertebral fluid or swelling. No visible canal  hematoma. Disc levels: Mild-to-moderate multilevel disc height loss and uncovertebral hypertrophy, similar to prior study. Ankylosis of the left C3-C4 facet joint again noted. Upper chest: Atelectasis in the posterior right upper lobe. Other: Bilateral carotid artery calcific atherosclerosis. IMPRESSION: 1.  No acute intracranial abnormality. 2.  No acute cervical spine fracture. Electronically Signed   By: Titus Dubin M.D.   On: 10/04/2018 14:53   Ct Cervical Spine Wo Contrast  Result Date: 10/04/2018 CLINICAL DATA:  Fall onto right hip. EXAM: CT HEAD WITHOUT CONTRAST CT CERVICAL SPINE WITHOUT CONTRAST TECHNIQUE: Multidetector CT imaging of the head and cervical spine was performed following the standard protocol without intravenous contrast. Multiplanar CT image reconstructions of the cervical spine were also generated. COMPARISON:  CT head dated July 28, 2017. CT cervical spine dated December 31, 2016. FINDINGS: CT HEAD FINDINGS Brain: No evidence of acute infarction, hemorrhage, hydrocephalus, extra-axial collection or mass lesion/mass effect. Stable atrophy and chronic microvascular ischemic changes. Vascular: Calcified atherosclerosis at the skullbase. No hyperdense vessel. Skull: Negative for fracture or focal lesion. Sinuses/Orbits: No acute finding. Other: None. CT CERVICAL SPINE FINDINGS Alignment: Normal. Skull base and vertebrae: No acute fracture. No primary bone lesion or focal pathologic process. Soft tissues and spinal canal: No prevertebral fluid or swelling. No visible canal hematoma. Disc levels: Mild-to-moderate multilevel disc height loss and uncovertebral hypertrophy, similar to prior study. Ankylosis of the left C3-C4 facet joint again noted. Upper chest: Atelectasis in the posterior right upper lobe. Other: Bilateral carotid artery calcific atherosclerosis. IMPRESSION: 1.  No acute intracranial abnormality. 2.  No acute cervical spine fracture. Electronically Signed   By: Titus Dubin M.D.   On: 10/04/2018 14:53   Dg Chest Port 1 View  Result Date: 10/08/2018 CLINICAL DATA:  Hypoxia. EXAM: PORTABLE CHEST 1 VIEW COMPARISON:  10/04/2018 FINDINGS: The cardiac silhouette is enlarged. There is a dual chamber left-sided pacemaker that is stable in appearance. There is no pneumothorax. Patient is status post remote vertebral augmentation of the midthoracic spine. The patient is rotated which limits evaluation. There is an opacity at the left lung base favored to be secondary to patient rotation. IMPRESSION: Examination is limited by patient rotation. Given this limitation, there is no definite acute cardiopulmonary process. Opacity at the left lung base is favored to be secondary to patient rotation. Consider short interval follow-up if symptoms persist. Electronically Signed   By: Constance Holster M.D.   On: 10/08/2018 19:19   Dg Knee Complete 4 Views Right  Result Date: 10/04/2018 CLINICAL DATA:  Fall EXAM: RIGHT KNEE - COMPLETE 4+ VIEW COMPARISON:  10/04/2018 FINDINGS: Vascular calcifications. No acute displaced fracture or malalignment. No significant knee effusion. Mild patellofemoral degenerative change. IMPRESSION: No acute osseous abnormality Electronically Signed   By: Donavan Foil M.D.   On: 10/04/2018 16:46  Dg C-arm 1-60 Min  Result Date: 10/04/2018 CLINICAL DATA:  IM nail right hip EXAM: DG C-ARM 61-120 MIN; OPERATIVE RIGHT HIP WITH PELVIS COMPARISON:  10/04/2018 FINDINGS: Four low resolution intraoperative spot views of the right hip. Total fluoroscopy time was 39 seconds. Images demonstrate intramedullary rod and screw fixation of the right femur across intertrochanteric fracture. IMPRESSION: Intraoperative fluoroscopic assistance provided during surgical fixation of right femur fracture Electronically Signed   By: Donavan Foil M.D.   On: 10/04/2018 20:35   Dg Hip Operative Unilat W Or W/o Pelvis Right  Result Date: 10/04/2018 CLINICAL DATA:  IM nail right hip  EXAM: DG C-ARM 61-120 MIN; OPERATIVE RIGHT HIP WITH PELVIS COMPARISON:  10/04/2018 FINDINGS: Four low resolution intraoperative spot views of the right hip. Total fluoroscopy time was 39 seconds. Images demonstrate intramedullary rod and screw fixation of the right femur across intertrochanteric fracture. IMPRESSION: Intraoperative fluoroscopic assistance provided during surgical fixation of right femur fracture Electronically Signed   By: Donavan Foil M.D.   On: 10/04/2018 20:35   Dg Hip Unilat W Or Wo Pelvis 2-3 Views Right  Result Date: 10/04/2018 CLINICAL DATA:  Fall with hip pain EXAM: DG HIP (WITH OR WITHOUT PELVIS) 2-3V RIGHT COMPARISON:  07/28/2017 FINDINGS: There is an acute displaced comminuted intratrochanteric fracture of the proximal right femur. There is no dislocation. There is diffuse osteopenia. There are degenerative changes of the left hip. Vascular calcifications are noted. IMPRESSION: Acute comminuted intratrochanteric fracture of the proximal right femur. Diffuse osteopenia is noted. Electronically Signed   By: Constance Holster M.D.   On: 10/04/2018 14:27     Subjective: Eager to start therapy  Discharge Exam: Vitals:   10/11/18 0439 10/11/18 0900  BP: (!) 141/47 (!) 154/50  Pulse: 77 87  Resp:    Temp:    SpO2:     Vitals:   10/10/18 2211 10/11/18 0432 10/11/18 0439 10/11/18 0900  BP: 99/62  (!) 141/47 (!) 154/50  Pulse: 75 79 77 87  Resp: 15 16    Temp: 98.6 F (37 C) 98.4 F (36.9 C)    TempSrc: Oral Oral    SpO2: 91% 92%    Weight:      Height:        General: Pt is alert, awake, not in acute distress Cardiovascular: RRR, S1/S2 +, no rubs, no gallops Respiratory: CTA bilaterally, no wheezing, no rhonchi Abdominal: Soft, NT, ND, bowel sounds + Extremities: no edema, no cyanosis   The results of significant diagnostics from this hospitalization (including imaging, microbiology, ancillary and laboratory) are listed below for reference.      Microbiology: Recent Results (from the past 240 hour(s))  SARS Coronavirus 2 (CEPHEID - Performed in [place] hospital lab), Hosp Order     Status: None   Collection Time: 10/04/18  1:24 PM  Result Value Ref Range Status   SARS Coronavirus 2 NEGATIVE NEGATIVE Final    Comment: (NOTE) If result is NEGATIVE SARS-CoV-2 target nucleic acids are NOT DETECTED. The SARS-CoV-2 RNA is generally detectable in upper and lower  respiratory specimens during the acute phase of infection. The lowest  concentration of SARS-CoV-2 viral copies this assay can detect is 250  copies / mL. A negative result does not preclude SARS-CoV-2 infection  and should not be used as the sole basis for treatment or other  patient management decisions.  A negative result may occur with  improper specimen collection / handling, submission of specimen other  than nasopharyngeal swab,  presence of viral mutation(s) within the  areas targeted by this assay, and inadequate number of viral copies  (<250 copies / mL). A negative result must be combined with clinical  observations, patient history, and epidemiological information. If result is POSITIVE SARS-CoV-2 target nucleic acids are DETECTED. The SARS-CoV-2 RNA is generally detectable in upper and lower  respiratory specimens dur ing the acute phase of infection.  Positive  results are indicative of active infection with SARS-CoV-2.  Clinical  correlation with patient history and other diagnostic information is  necessary to determine patient infection status.  Positive results do  not rule out bacterial infection or co-infection with other viruses. If result is PRESUMPTIVE POSTIVE SARS-CoV-2 nucleic acids MAY BE PRESENT.   A presumptive positive result was obtained on the submitted specimen  and confirmed on repeat testing.  While 2019 novel coronavirus  (SARS-CoV-2) nucleic acids may be present in the submitted sample  additional confirmatory testing may be  necessary for epidemiological  and / or clinical management purposes  to differentiate between  SARS-CoV-2 and other Sarbecovirus currently known to infect humans.  If clinically indicated additional testing with an alternate test  methodology [phone]) is advised. The SARS-CoV-2 RNA is generally  detectable in upper and lower respiratory sp ecimens during the acute  phase of infection. The expected result is Negative. Fact Sheet for Patients:  StrictlyIdeas.no Fact Sheet for Healthcare Providers: BankingDealers.co.za This test is not yet approved or cleared by the [place] FDA and has been authorized for detection and/or diagnosis of SARS-CoV-2 by FDA under an Emergency Use Authorization (EUA).  This EUA will remain in effect (meaning this test can be used) for the duration of the COVID-19 declaration under Section 564(b)(1) of the Act, 21 U.S.C. section 360bbb-3(b)(1), unless the authorization is terminated or revoked sooner. Performed at [facility], [place] [address], [place], [place] [address]      Labs: BNP (last 3 results) Recent Labs    12/14/17 2023  BNP 2,202.5*   Basic Metabolic Panel: Recent Labs  Lab 10/05/18 0346 10/08/18 1240 10/10/18 1442  NA 139 134* 129*  K 5.3* 3.8 4.5  CL 95* 92* 91*  CO2 26 27 24   GLUCOSE 142* 118* 97  BUN 32* 40* 42*  CREATININE 7.23* 4.84* 4.61*  CALCIUM 8.3* 8.0* 8.6*  PHOS  --  3.0 2.5   Liver Function Tests: Recent Labs  Lab 10/08/18 1240 10/10/18 1442  ALBUMIN 1.9* 2.0*   No results for input(s): LIPASE, AMYLASE in the last 168 hours. No results for input(s): AMMONIA in the last 168 hours. CBC: Recent Labs  Lab 10/05/18 0346 10/06/18 0225 10/06/18 1147 10/08/18 0821 10/09/18 0410 10/10/18 1338  WBC 7.9 11.6*  --  9.7 9.1 9.2  HGB 8.8* 6.1* 7.4* 7.5* 8.1* 7.9*  HCT 28.2* 19.7* 23.6* 24.1* 26.1* 25.7*  MCV 103.7* 103.1*  --  99.2 99.6 99.6  PLT 163 129*   --  147* 178 253   Cardiac Enzymes: No results for input(s): CKTOTAL, CKMB, CKMBINDEX, TROPONINI in the last 168 hours. BNP: Invalid input(s): POCBNP CBG: Recent Labs  Lab 10/10/18 2154  GLUCAP 163*   D-Dimer No results for input(s): DDIMER in the last 72 hours. Hgb A1c No results for input(s): HGBA1C in the last 72 hours. Lipid Profile No results for input(s): CHOL, HDL, LDLCALC, TRIG, CHOLHDL, LDLDIRECT in the last 72 hours. Thyroid function studies No results for input(s): TSH, T4TOTAL, T3FREE, THYROIDAB in the last 72 hours.  Invalid input(s): FREET3 Anemia work up No results for input(s): VITAMINB12, FOLATE, FERRITIN, TIBC, IRON, RETICCTPCT in the last 72 hours. Urinalysis    Component Value Date/Time   COLORURINE YELLOW 01/20/2017 1737   APPEARANCEUR CLEAR 01/20/2017 1737   LABSPEC 1.012 01/20/2017 1737   PHURINE 5.0 01/20/2017 1737   GLUCOSEU NEGATIVE 01/20/2017 1737   HGBUR NEGATIVE 01/20/2017 1737   BILIRUBINUR NEGATIVE 01/20/2017 1737   BILIRUBINUR negative 09/24/2016 1453   KETONESUR NEGATIVE 01/20/2017 1737   PROTEINUR 30 (A) 01/20/2017 1737   UROBILINOGEN 0.2 09/24/2016 1453   NITRITE NEGATIVE 01/20/2017 1737   LEUKOCYTESUR NEGATIVE 01/20/2017 1737   Sepsis Labs Invalid input(s): PROCALCITONIN,  WBC,  LACTICIDVEN Microbiology Recent Results (from the past 240 hour(s))  SARS Coronavirus 2 (CEPHEID - Performed in [place] hospital lab), Hosp Order     Status: None   Collection Time: 10/04/18  1:24 PM  Result Value Ref Range Status   SARS Coronavirus 2 NEGATIVE NEGATIVE Final    Comment: (NOTE) If result is NEGATIVE SARS-CoV-2 target nucleic acids are NOT DETECTED. The SARS-CoV-2 RNA is generally detectable in upper and lower  respiratory specimens during the acute phase of infection. The lowest  concentration of SARS-CoV-2 viral copies this assay can detect is 250  copies / mL. A negative result does not preclude SARS-CoV-2 infection  and should  not be used as the sole basis for treatment or other  patient management decisions.  A negative result may occur with  improper specimen collection / handling, submission of specimen other  than nasopharyngeal swab, presence of viral mutation(s) within the  areas targeted by this assay, and inadequate number of viral copies  (<250 copies / mL). A negative result must be combined with clinical  observations, patient history, and epidemiological information. If result is POSITIVE SARS-CoV-2 target nucleic acids are DETECTED. The SARS-CoV-2 RNA is generally detectable in upper and lower  respiratory specimens dur ing the acute phase of infection.  Positive  results are indicative of active infection with SARS-CoV-2.  Clinical  correlation with patient history and other diagnostic information is  necessary to determine patient infection status.  Positive results do  not rule out bacterial infection or co-infection with other viruses. If result is PRESUMPTIVE POSTIVE SARS-CoV-2 nucleic acids MAY BE PRESENT.   A presumptive positive result was obtained on the submitted specimen  and confirmed on repeat testing.  While 2019 novel coronavirus  (SARS-CoV-2) nucleic acids may be present in the submitted sample  additional confirmatory testing may be necessary for epidemiological  and / or clinical management purposes  to differentiate between  SARS-CoV-2 and other Sarbecovirus currently known to infect humans.  If clinically indicated additional testing with an alternate test  methodology [phone]) is advised. The SARS-CoV-2 RNA is generally  detectable in upper and lower respiratory sp ecimens during the acute  phase of infection. The expected result is Negative. Fact Sheet for Patients:  StrictlyIdeas.no Fact Sheet for Healthcare Providers: BankingDealers.co.za This test is not yet approved or cleared by the [place] FDA and has been  authorized for detection and/or diagnosis of SARS-CoV-2 by FDA under an Emergency Use Authorization (EUA).  This EUA will remain in effect (meaning this test can be used) for the duration of the COVID-19 declaration under Section 564(b)(1) of the Act, 21 U.S.C. section 360bbb-3(b)(1), unless the authorization is terminated or revoked sooner. Performed at [facility], [place] [address], [place], [place] [address]    Time spent: 30 min  SIGNED:   Marylu Lund, MD  [facility] 10/11/2018, 1:28 PM  If 7PM-7AM, please contact night-coverage

## 2018-10-11 NOTE — Progress Notes (Signed)
Renal Navigator notified OP HD clinic/NW of plan for discharge today to SNF to provide continuity of care and smooth transition from hospital back to OP HD clinic.  Alphonzo Cruise, [place] Renal Navigator [phone]

## 2018-10-11 NOTE — Progress Notes (Signed)
Upon cleaning room after pt left, this RN found pt's electric toothbrush and toothpaste. Items placed in pt belonging bag with pt label and given to [employer], Retail banker. Ashlee to call pt's daughter to inform.

## 2018-10-11 NOTE — Progress Notes (Addendum)
Occupational Therapy Treatment Patient Details Name: Valerie Terrell MRN: [id number] DOB: 13-Sep-1932 Today's Date: 10/11/2018    History of present illness Pt is an 85 y.o. female admitted 10/04/18 after falling at home sustaining R hip fx. S/p R intertrochanteric IM nail 5/19. PMH includes ESRD (HD TTS), CHF, CVA, pacemaker, CHF, O2 dependence, compression fxs.   OT comments  Pt progressing slowly toward OT goals. Pt receptive of therapy session. Pt demonstrates confusion with simple commands and decreased body awareness. Pt received slumped and leaning left in chair. Pt engaged in toilet transfer with stedy for safety. Max A +2 required for safety, VCs for hand placement and body positioning. Max A toilet hygiene and clothing management due to decrease standing tolerance. DC and freq remains the same. OT will continue to follow acutely.   Follow Up Recommendations  SNF    Equipment Recommendations  3 in 1 bedside commode    Recommendations for Other Services      Precautions / Restrictions Precautions Precautions: Fall Restrictions Weight Bearing Restrictions: Yes RLE Weight Bearing: Weight bearing as tolerated       Mobility    Transfers Overall transfer level: Needs assistance Equipment used: Rolling walker (2 wheeled) Transfers: Sit to/from Stand;Stand Pivot Transfers Sit to Stand: +2 physical assistance;Max assist Stand pivot transfers: Total assist Squat pivot transfers: Max assist;+2 physical assistance     General transfer comment: max A +2 multiple stands from stedy for use of BSC and toilet/clothing management. several cues to stand up right on Stedy.    Balance Overall balance assessment: Needs assistance Sitting-balance support: Feet supported Sitting balance-Leahy Scale: Fair Sitting balance - Comments: supervision in sitting but able to sit without physical assist   Standing balance support: Bilateral upper extremity supported Standing balance-Leahy  Scale: Poor Standing balance comment: heavy fwd lean onto stedy and had difficulty following commands to stand upright                           ADL either performed or assessed with clinical judgement   ADL Overall ADL's : Needs assistance/impaired    Toilet Transfer: Maximal assistance;+2 for physical assistance;+2 for safety/equipment;BSC Toilet Transfer Details (indicate cue type and reason): engaged in toilet transfer from chair to [facility] with baristedy.         Functional mobility during ADLs: Maximal assistance;+2 for physical assistance;+2 for safety/equipment General ADL Comments: start of session SpO2 99% with O2, end of session 95% with RA.      Vision       Perception     Praxis      Cognition Arousal/Alertness: Awake/alert Behavior During Therapy: Flat affect Overall Cognitive Status: Impaired/Different from baseline Area of Impairment: Following commands;Problem solving                 Orientation Level: Disoriented to;Time;Situation Current Attention Level: Sustained Memory: Decreased short-term memory;Decreased recall of precautions Following Commands: Follows one step commands with increased time Safety/Judgement: Decreased awareness of deficits;Decreased awareness of safety Awareness: Intellectual Problem Solving: Slow processing;Decreased initiation;Difficulty sequencing;Requires verbal cues;Requires tactile cues General Comments: Pt demonstrated some confusion today with simple directions and decreased awareness of body positioning.         Exercises    Shoulder Instructions       General Comments      Pertinent Vitals/ Pain       Pain Assessment: Faces Faces Pain Scale: Hurts little more Pain Location: RLE Pain Descriptors / Indicators:  Grimacing;Guarding;Moaning Pain Intervention(s): Limited activity within patient's tolerance;Monitored during session;Repositioned  Home Living                                           Prior Functioning/Environment              Frequency  Min 2X/week        Progress Toward Goals  OT Goals(current goals can now be found in the care plan section)  Progress towards OT goals: Progressing toward goals  Acute Rehab OT Goals Patient Stated Goal: Get out of this bed; be able to eat without shaking OT Goal Formulation: With patient Time For Goal Achievement: 10/20/18 Potential to Achieve Goals: Good ADL Goals Pt Will Perform Lower Body Bathing: with min assist;sitting/lateral leans;with adaptive equipment Pt Will Perform Lower Body Dressing: with min assist;with adaptive equipment;sitting/lateral leans Pt Will Transfer to Toilet: with mod assist;stand pivot transfer;bedside commode Pt Will Perform Toileting - Clothing Manipulation and hygiene: with min guard assist;sitting/lateral leans Pt Will Perform Tub/Shower Transfer: with mod assist;3 in 1;rolling walker  Plan Discharge plan remains appropriate;Frequency remains appropriate    Co-evaluation    PT/OT/SLP Co-Evaluation/Treatment: Yes            AM-PAC OT "6 Clicks" Daily Activity     Outcome Measure   Help from another person eating meals?: A Little Help from another person taking care of personal grooming?: A Little Help from another person toileting, which includes using toliet, bedpan, or urinal?: A Lot Help from another person bathing (including washing, rinsing, drying)?: A Lot Help from another person to put on and taking off regular upper body clothing?: A Little Help from another person to put on and taking off regular lower body clothing?: Total 6 Click Score: 14    End of Session Equipment Utilized During Treatment: Oxygen;Gait belt  OT Visit Diagnosis: Unsteadiness on feet (R26.81);Pain Pain - Right/Left: Right   Activity Tolerance Patient tolerated treatment well   Patient Left in chair;with call bell/phone within reach;with chair alarm set   Nurse Communication  Mobility status;Need for lift equipment(use bariatric stedy)        Time: 7517-0017 OT Time Calculation (min): 25 min  Charges: OT General Charges $OT Visit: 1 Visit OT Treatments $Self Care/Home Management : 23-37 mins  Minus Breeding, MSOT, OTR/L  Supplemental Rehabilitation Services  [phone]   ANAJULIA LEYENDECKER 10/11/2018, 12:49 PM

## 2018-10-11 NOTE — Plan of Care (Signed)
Problem: Education: Goal: Knowledge of General Education information will improve Description Including pain rating scale, medication(s)/side effects and non-pharmacologic comfort measures Outcome: Progressing   Problem: Health Behavior/Discharge Planning: Goal: Ability to manage health-related needs will improve Outcome: Progressing   Problem: Clinical Measurements: Goal: Ability to maintain clinical measurements within normal limits will improve Outcome: Progressing Goal: Respiratory complications will improve Outcome: Progressing Goal: Cardiovascular complication will be avoided Outcome: Progressing   Problem: Activity: Goal: Risk for activity intolerance will decrease Outcome: Progressing   Problem: Nutrition: Goal: Adequate nutrition will be maintained Outcome: Progressing   Problem: Coping: Goal: Level of anxiety will decrease Outcome: Progressing   Problem: Elimination: Goal: Will not experience complications related to bowel motility Outcome: Progressing   Problem: Pain Managment: Goal: General experience of comfort will improve Outcome: Progressing   Problem: Skin Integrity: Goal: Risk for impaired skin integrity will decrease Outcome: Progressing

## 2018-10-11 NOTE — Social Work (Signed)
Clinical Social Worker facilitated patient discharge including contacting patient family and facility to confirm patient discharge plans.  Clinical information faxed to facility and family agreeable with plan.  CSW arranged ambulance transport via [facility] to [facility]. RN to call [phone]  with report prior to discharge.  Clinical Social Worker will sign off for now as social work intervention is no longer needed. Please consult [place] again if new need arises.  Westley Hummer, MSW, [place] Social Worker [phone]

## 2018-10-11 NOTE — Progress Notes (Addendum)
[facility] Progress Note   Subjective:  Seen in room - slightly confused, has entire contents of her bag in her lap looking for comb and scissors - assisted her. Denies CP/dyspnea. Says hip pain "same."  Objective Vitals:   10/10/18 2211 10/11/18 0432 10/11/18 0439 10/11/18 0900  BP: 99/62  (!) 141/47 (!) 154/50  Pulse: 75 79 77 87  Resp: 15 16    Temp: 98.6 F (37 C) 98.4 F (36.9 C)    TempSrc: Oral Oral    SpO2: 91% 92%    Weight:      Height:       Physical Exam General: Frail woman, NAD. Scattered today, flat affect. Heart: RRR; no murmur Lungs: CTAB Abdomen: soft, non-tender Extremities: No LE edema Dialysis Access: RUE AVG + bruit  Additional Objective Labs: Basic Metabolic Panel: Recent Labs  Lab 10/05/18 0346 10/08/18 1240 10/10/18 1442  NA 139 134* 129*  K 5.3* 3.8 4.5  CL 95* 92* 91*  CO2 26 27 24   GLUCOSE 142* 118* 97  BUN 32* 40* 42*  CREATININE 7.23* 4.84* 4.61*  CALCIUM 8.3* 8.0* 8.6*  PHOS  --  3.0 2.5   Liver Function Tests: Recent Labs  Lab 10/08/18 1240 10/10/18 1442  ALBUMIN 1.9* 2.0*   CBC: Recent Labs  Lab 10/04/18 1324 10/05/18 0346 10/06/18 0225  10/08/18 0821 10/09/18 0410 10/10/18 1338  WBC 8.6 7.9 11.6*  --  9.7 9.1 9.2  NEUTROABS 6.7  --   --   --   --   --   --   HGB 10.6* 8.8* 6.1*   < > 7.5* 8.1* 7.9*  HCT 34.6* 28.2* 19.7*   < > 24.1* 26.1* 25.7*  MCV 105.5* 103.7* 103.1*  --  99.2 99.6 99.6  PLT 171 163 129*  --  147* 178 253   < > = values in this interval not displayed.   Medications: . methocarbamol (ROBAXIN) IV Stopped (10/06/18 0413)   . amiodarone  200 mg Oral Daily  . aspirin  325 mg Oral Daily  . carvedilol  3.125 mg Oral BID WC  . Chlorhexidine Gluconate Cloth  6 each Topical Q0600  . darbepoetin (ARANESP) injection - DIALYSIS  100 mcg Intravenous Q Tue-HD  . docusate sodium  100 mg Oral BID  . doxercalciferol  5 mcg Intravenous Q T,Th,Sa-HD  . feeding supplement (NEPRO CARB  STEADY)  237 mL Oral BID BM  . gabapentin  100 mg Oral BID  . isosorbide mononitrate  60 mg Oral Daily  . multivitamin  1 tablet Oral QHS  . pravastatin  40 mg Oral Daily  . sevelamer carbonate  1,600 mg Oral With snacks  . sevelamer carbonate  2,400 mg Oral TID WC  . vitamin C  500 mg Oral BID    Dialysis Orders: TTS NW 4h 350/600 53kg P4 AVG Hep 1400 - Hectoral 5mcg IV q HD - Mircera 100 q 2 weeks (last 5/12)  Assessment/Plan: 1. R hip fx s/p fall - s/p intramedullary implant 5/19  2. ESRD: Usually TTS schedule - had dyspnea yesterday so dialyzed early. Next due Thurs unless issues arise. 2.7 L off w/ HD yest, will need another treatment today to get closer to edw.  3. Anemia: S/p post-op drop to 6. S/p 1U PRBC 5/21. Hgb 7.9 today - due for ESA this week.  4. Secondary hyperparathyroidism: Ca/Phos ok. Continue home binder. 5. HTN/volume: Not quite reaching EDW but close. Follow. 6. Nutrition: Alb low,  continue Nepro. 7. Cards: PM for  SSS, Afib on coreg/amio, HFpEF in NSR today 9. Dispo: Possibly to SNF today for rehab.  [name], PA-C 10/11/2018, 10:56 AM  [facility] Pager: [phone]  Pt seen, examined and agree w assess/plan as above with additions as indicated.  [facility] 10/11/2018, 3:28 PM

## 2018-10-11 NOTE — TOC Progression Note (Signed)
Transition of Care [facility]) - Progression Note    Patient Details  Name: Valerie Terrell MRN: [id number] Date of Birth: [birth year]  Transition of Care [facility]) CM/SW [place], [place] Phone Number: 10/11/2018, 10:08 AM  Clinical Narrative:    Spoke with pt and pt daughter. They are aware dc is possibly today. Daughter will complete paperwork prior to transfer at 1pm.    Expected Discharge Plan: [place] Barriers to Discharge: Continued Medical Work up  Expected Discharge Plan and Services Expected Discharge Plan: [place] In-house Referral: Clinical Social Work   Post Acute Care Choice: [place] Living arrangements for the past 2 months: [place]                    Social Determinants of Health (SDOH) Interventions    Readmission Risk Interventions Readmission Risk Prevention Plan 10/06/2018  Transportation Screening Complete  Medication Review Press photographer) Complete  PCP or Specialist appointment within 3-5 days of discharge Complete  HRI or Home Care Consult Complete  SW Recovery Care/Counseling Consult Complete  Palliative Care Screening Not Applicable  Skilled Nursing Facility Complete  Some recent data might be hidden

## 2018-10-12 DIAGNOSIS — N2581 Secondary hyperparathyroidism of renal origin: Secondary | ICD-10-CM | POA: Diagnosis not present

## 2018-10-12 DIAGNOSIS — N186 End stage renal disease: Secondary | ICD-10-CM | POA: Diagnosis not present

## 2018-10-12 DIAGNOSIS — E8770 Fluid overload, unspecified: Secondary | ICD-10-CM | POA: Diagnosis not present

## 2018-10-13 DIAGNOSIS — E876 Hypokalemia: Secondary | ICD-10-CM | POA: Diagnosis not present

## 2018-10-13 DIAGNOSIS — N186 End stage renal disease: Secondary | ICD-10-CM | POA: Diagnosis not present

## 2018-10-13 DIAGNOSIS — N2581 Secondary hyperparathyroidism of renal origin: Secondary | ICD-10-CM | POA: Diagnosis not present

## 2018-10-13 DIAGNOSIS — D631 Anemia in chronic kidney disease: Secondary | ICD-10-CM | POA: Diagnosis not present

## 2018-10-13 DIAGNOSIS — D689 Coagulation defect, unspecified: Secondary | ICD-10-CM | POA: Diagnosis not present

## 2018-10-13 DIAGNOSIS — R52 Pain, unspecified: Secondary | ICD-10-CM | POA: Diagnosis not present

## 2018-10-14 ENCOUNTER — Telehealth: Payer: Self-pay | Admitting: Family Medicine

## 2018-10-14 DIAGNOSIS — G894 Chronic pain syndrome: Secondary | ICD-10-CM | POA: Diagnosis not present

## 2018-10-14 DIAGNOSIS — S72101D Unspecified trochanteric fracture of right femur, subsequent encounter for closed fracture with routine healing: Secondary | ICD-10-CM | POA: Diagnosis not present

## 2018-10-14 DIAGNOSIS — N186 End stage renal disease: Secondary | ICD-10-CM | POA: Diagnosis not present

## 2018-10-14 DIAGNOSIS — I480 Paroxysmal atrial fibrillation: Secondary | ICD-10-CM | POA: Diagnosis not present

## 2018-10-14 NOTE — Telephone Encounter (Signed)
Kim, can you get this going for patient?-thx

## 2018-10-14 NOTE — Telephone Encounter (Signed)
Pt's daughter stated mom does not feel safe at [facility] and is arranging transportation for her to come home tomorrow. Advised may take several days before equipment sent.   Please advise if orders can be placed, thanks.

## 2018-10-14 NOTE — Telephone Encounter (Signed)
Patient has broken her hip. Patient was advised by hospitalist to go to rehab at [facility]. Patient's daughter wants to move patient back home as that patient does not want to stay there. Patient's daughter needs assistance with home health care(she's used [facility] in the past and is okay with continuing to work with them), and hospital bed, and trapezii bar for assistance with moving in and out of bed, bed pan, over-bed table. Patient would like to move out tomorrow.

## 2018-10-15 ENCOUNTER — Emergency Department (HOSPITAL_COMMUNITY): Payer: Medicare Other

## 2018-10-15 ENCOUNTER — Encounter (HOSPITAL_COMMUNITY): Payer: Self-pay | Admitting: Emergency Medicine

## 2018-10-15 ENCOUNTER — Other Ambulatory Visit: Payer: Self-pay

## 2018-10-15 ENCOUNTER — Emergency Department (HOSPITAL_COMMUNITY)
Admission: EM | Admit: 2018-10-15 | Discharge: 2018-10-15 | Disposition: A | Discharge status: Home / Self Care | Payer: Medicare Other | Attending: Emergency Medicine | Admitting: Emergency Medicine

## 2018-10-15 DIAGNOSIS — Z7982 Long term (current) use of aspirin: Secondary | ICD-10-CM | POA: Diagnosis not present

## 2018-10-15 DIAGNOSIS — N186 End stage renal disease: Secondary | ICD-10-CM | POA: Insufficient documentation

## 2018-10-15 DIAGNOSIS — D689 Coagulation defect, unspecified: Secondary | ICD-10-CM | POA: Diagnosis not present

## 2018-10-15 DIAGNOSIS — M25551 Pain in right hip: Secondary | ICD-10-CM | POA: Insufficient documentation

## 2018-10-15 DIAGNOSIS — I120 Hypertensive chronic kidney disease with stage 5 chronic kidney disease or end stage renal disease: Secondary | ICD-10-CM | POA: Diagnosis not present

## 2018-10-15 DIAGNOSIS — Z8673 Personal history of transient ischemic attack (TIA), and cerebral infarction without residual deficits: Secondary | ICD-10-CM | POA: Insufficient documentation

## 2018-10-15 DIAGNOSIS — Z950 Presence of cardiac pacemaker: Secondary | ICD-10-CM | POA: Diagnosis not present

## 2018-10-15 DIAGNOSIS — I959 Hypotension, unspecified: Secondary | ICD-10-CM | POA: Diagnosis not present

## 2018-10-15 DIAGNOSIS — Z79899 Other long term (current) drug therapy: Secondary | ICD-10-CM | POA: Diagnosis not present

## 2018-10-15 DIAGNOSIS — I5042 Chronic combined systolic (congestive) and diastolic (congestive) heart failure: Secondary | ICD-10-CM | POA: Insufficient documentation

## 2018-10-15 DIAGNOSIS — I132 Hypertensive heart and chronic kidney disease with heart failure and with stage 5 chronic kidney disease, or end stage renal disease: Secondary | ICD-10-CM | POA: Insufficient documentation

## 2018-10-15 DIAGNOSIS — G8918 Other acute postprocedural pain: Secondary | ICD-10-CM | POA: Diagnosis not present

## 2018-10-15 DIAGNOSIS — N2581 Secondary hyperparathyroidism of renal origin: Secondary | ICD-10-CM | POA: Diagnosis not present

## 2018-10-15 DIAGNOSIS — R0902 Hypoxemia: Secondary | ICD-10-CM | POA: Diagnosis not present

## 2018-10-15 DIAGNOSIS — R0602 Shortness of breath: Secondary | ICD-10-CM | POA: Diagnosis not present

## 2018-10-15 DIAGNOSIS — Z992 Dependence on renal dialysis: Secondary | ICD-10-CM | POA: Diagnosis not present

## 2018-10-15 DIAGNOSIS — D631 Anemia in chronic kidney disease: Secondary | ICD-10-CM | POA: Diagnosis not present

## 2018-10-15 DIAGNOSIS — Z87891 Personal history of nicotine dependence: Secondary | ICD-10-CM | POA: Insufficient documentation

## 2018-10-15 DIAGNOSIS — R52 Pain, unspecified: Secondary | ICD-10-CM | POA: Diagnosis not present

## 2018-10-15 DIAGNOSIS — E876 Hypokalemia: Secondary | ICD-10-CM | POA: Diagnosis not present

## 2018-10-15 LAB — BASIC METABOLIC PANEL
Anion gap: 18 — ABNORMAL HIGH (ref 5–15)
BUN: 26 mg/dL — ABNORMAL HIGH (ref 8–23)
CO2: 25 mmol/L (ref 22–32)
Calcium: 9 mg/dL (ref 8.9–10.3)
Chloride: 90 mmol/L — ABNORMAL LOW (ref 98–111)
Creatinine, Ser: 3.81 mg/dL — ABNORMAL HIGH (ref 0.44–1.00)
GFR calc Af Amer: 12 mL/min — ABNORMAL LOW (ref >60)
GFR calc non Af Amer: 10 mL/min — ABNORMAL LOW (ref >60)
Glucose, Bld: 99 mg/dL (ref 70–99)
Potassium: 4.1 mmol/L (ref 3.5–5.1)
Sodium: 133 mmol/L — ABNORMAL LOW (ref 135–145)

## 2018-10-15 LAB — POCT I-STAT 7, (LYTES, BLD GAS, ICA,H+H)
Acid-Base Excess: 3 mmol/L — ABNORMAL HIGH (ref 0.0–2.0)
Bicarbonate: 28.3 mmol/L — ABNORMAL HIGH (ref 20.0–28.0)
Calcium, Ion: 1.19 mmol/L (ref 1.15–1.40)
HCT: 25 % — ABNORMAL LOW (ref 36.0–46.0)
Hemoglobin: 8.5 g/dL — ABNORMAL LOW (ref 12.0–15.0)
O2 Saturation: 89 %
Patient temperature: 98.6
Potassium: 3.7 mmol/L (ref 3.5–5.1)
Sodium: 131 mmol/L — ABNORMAL LOW (ref 135–145)
TCO2: 30 mmol/L (ref 22–32)
pCO2 arterial: 45.2 mmHg (ref 32.0–48.0)
pH, Arterial: 7.405 (ref 7.350–7.450)
pO2, Arterial: 57 mmHg — ABNORMAL LOW (ref 83.0–108.0)

## 2018-10-15 LAB — CBC
HCT: 28.1 % — ABNORMAL LOW (ref 36.0–46.0)
Hemoglobin: 8.5 g/dL — ABNORMAL LOW (ref 12.0–15.0)
MCH: 30.4 pg (ref 26.0–34.0)
MCHC: 30.2 g/dL (ref 30.0–36.0)
MCV: 100.4 fL — ABNORMAL HIGH (ref 80.0–100.0)
Platelets: 349 10*3/uL (ref 150–400)
RBC: 2.8 MIL/uL — ABNORMAL LOW (ref 3.87–5.11)
RDW: 15.5 % (ref 11.5–15.5)
WBC: 12.8 10*3/uL — ABNORMAL HIGH (ref 4.0–10.5)
nRBC: 0 % (ref 0.0–0.2)

## 2018-10-15 NOTE — Progress Notes (Signed)
CSW spoke with [facility] and noted per their report that she had been discharged Friday and was no longer a resident. CSW called patient's daughter and noted she would come to pick up her mother. CSW noted prior concerns and noted daughter stated she would come to make sure she gets to dialysis. CSW signing off at this time, please re-consult for future SW needs.  Lamonte Richer, LCSW, [place] Worker II [phone]

## 2018-10-15 NOTE — ED Triage Notes (Signed)
Patient arrived with EMS from home reports constipation for several days and right hip pain , S/P right hip surgery 2 weeks ago , last BM this morning ( small/hard) , denies emesis or fever . Hemodialysis q Tues/Thurs/Sat.

## 2018-10-15 NOTE — ED Notes (Signed)
MD has turned off pt O2 to monitor pt respiratory status.

## 2018-10-15 NOTE — Discharge Instructions (Addendum)
1.  Continue to work with your family doctor and your orthopedic doctor regarding pain control for your hip.  Go to your scheduled appointment on Tuesday. 2.  Go directly to dialysis from the emergency department. 3.  Take a stool softener such as Colace twice daily if you are having problems with constipation.  You may also take MiraLAX to have a bowel movement. 4.  Work with your doctor and home health.  If you cannot manage at home with assistance, discuss placement at a rehab facility again.

## 2018-10-15 NOTE — ED Provider Notes (Signed)
[facility] EMERGENCY DEPARTMENT Provider Note   CSN: [id number] Arrival date & time: 10/15/18  0700    History   Chief Complaint Chief Complaint  Patient presents with  . Constipation  . Hip Pain    HPI Valerie Terrell is a 85 y.o. female.     HPI S/P right hip arthroplasty 5/19 for fracture after fall. Was in [facility] rehab but thinks the care is very bad so checked out yesterday at 5:30pm. "Lost my things, don't trust them to give my medications, don't trust them period." Her daughter is at home with her but patient reports her daughter can't manage her care. Due for dialysis at [facility] at noon today. Reports her hip hurts but not worse than previously.  She reports she took a dose of her oxycodone this morning.  She reports it is somewhat helpful but anytime she moves or has to do something the hip still hurts. Past Medical History:  Diagnosis Date  . Anemia of chronic disease 2018   + anemia of CRI?  [name] Atrophy of left kidney    with absent blood flow by renal artery dopplers (Dr. Gwenlyn Found)  . 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 Chronic combined systolic and diastolic CHF (congestive heart failure) ([place])    a. 11/2016: echo showing EF of 35-40%, RV strain noted, mild MR and mild TR.   Echo showed much improved EF 12/2017 (55-60%)  . Chronic respiratory failure with hypoxia ([place]) 02/11/2017   Now on chronic O2 (intermittent as of summer 2019).    . Debilitated patient    WC dependent as of 2018  . ESRD on hemodialysis ([place]) 01/2017   T/Th/Sat schedule- [place].  Right basilic AV fistula 07/7046--GQB unable to successfully access this.  Dr. Donnetta Hutching to do an upper arm AV graft as of 10/2017..  . History of adenomatous polyp of colon   . History of kidney stones    passed  . History of subarachnoid hemorrhage 10/2014   after syncope and while on xarelto  . HOH (hard of hearing)   . Hyperlipidemia   . Hypertension    Difficult to  control, in the setting of one functioning kidney: pt was referred to nephrology by Dr. Gwenlyn Found 06/2015.  . Lumbar radiculopathy 2012  . Lumbar spinal stenosis 2019   facet injections only very short term relief.  L1 and L2 selective nerve root blocks helpful 04/2018.  . Lumbar spondylosis    MR 07/2016---no sign of spinal nerve compression or cord compression.  Pt set up with outpt ortho while admitted to hosp 07/2016.  . Malnourished ([place])   . Metatarsal fracture 06/10/2016   Nondisplaced, left 5th metatarsal--pt was referred to ortho  . Osteopenia 2014   T-score -2.1  . PAF (paroxysmal atrial fibrillation) (HCC)    Eliquis started after BRAO and CVA.  She was changed to warfarin 2018--INR/coumadin mgmt per HD/nephrol.  . Presence of permanent cardiac pacemaker   . Right rib fracture 12/2016   s/p fall  . Sick sinus syndrome ([place]) 11/2016   Dual chamber pacer insertion 2018 (Dr. Lovena Le)  . Stroke ([place]) 16/9450   cardioembolic (had CVA while on no anticoag)--"scattered subacute punctate infarcts: 1 in R parietal lobe and 2 in occipital cortex" on MRI br.  CT angio head/neck: aortic arch athero.  R ICA 20% stenosis, L ICA w/out any stenosis.  . Thoracic back pain 01/2017; 01/2018   2018: Facet?  Dr. Ramos->steroid injection.  01/2018-->CT T spine to check for a new comp fx-->none found.  Selective nerve root inj in T spine helpful on R side.   . Thoracic compression fracture ([place]) 07/2016   T3.  T7 and T8-- T8 kyphoplasty during hosp admission 07/2016.  Neuro referred pt to pain mgmt for consideration of injection 09/2016.  I referred her to endo 08/2016 for consideration of calcitonin treatment.    Patient Active Problem List   Diagnosis Date Noted  . Hip fracture ([place]) 10/04/2018  . Fracture of pubic ramus ([place]) 07/19/2018  . Fracture of sacrum ([place]) 07/19/2018  . Perineal hematoma 07/19/2018  . Fever 07/19/2018  . Acute blood loss anemia 07/19/2018  . ESRD (end stage renal disease) on  dialysis ([place]) 05/20/2018  . Chronic back pain 05/14/2018  . Chronic pain syndrome 05/14/2018  . Hypoxia 12/14/2017  . Dyspnea 12/14/2017  . Degeneration of thoracic intervertebral disc 07/26/2017  . Acute respiratory failure ([place]) 02/13/2017  . Hyperkalemia 02/13/2017  . Chronic respiratory failure with hypoxia ([place]) 02/11/2017  . Debilitated patient 02/07/2017  . Poor tolerance for ambulation 02/07/2017  . Fracture of one rib, right side, initial encounter for closed fracture 01/11/2017  . Closed fracture of one rib of right side   . Chronic combined systolic and diastolic heart failure ([place]) 01/05/2017  . Palpitations 12/15/2016  . Hypokalemia 12/14/2016  . Leukocytosis 12/14/2016  . Long term (current) use of anticoagulants [Z79.01] 12/11/2016  . Non-ischemic cardiomyopathy ([place]) 12/07/2016  . Acute combined systolic and diastolic heart failure ([place])  12/07/2016  . Cardiac pacemaker   . Acute on chronic respiratory failure with hypoxia ([place])   . Acute pulmonary edema with congestive heart failure ([place]) 11/30/2016  . Elevated troponin level 11/30/2016  . Anemia 11/30/2016  . Chronic midline thoracic back pain 09/15/2016  . History of CVA (cerebrovascular accident) 09/15/2016  . Hyperlipidemia   . Nausea & vomiting 08/05/2016  . Dehydration 08/05/2016  . Osteoporosis 07/28/2016  . Closed compression fracture of thoracic vertebra ([place]) 07/23/2016  . Thoracic compression fracture ([place]) 07/22/2016  . Flank pain 07/11/2016  . Hypoalbuminemia 07/11/2016  . Renal artery stenosis ([place]) 06/24/2016  . Nondisplaced fracture of fifth metatarsal bone, left foot, initial encounter for closed fracture 06/10/2016  . Rib contusion, left, initial encounter 06/10/2016  . Single kidney 03/13/2016  . Chest pain   . Dyslipidemia   . PAF (paroxysmal atrial fibrillation) ([place]) 01/28/2016  . Essential hypertension 01/28/2016  . History of cardioembolic stroke 32/67/1245  . Personal history of  subarachnoid hemorrhage 01/28/2016  . CKD (chronic kidney disease), stage IV ([place]) 01/28/2016  . Gait disturbance 01/27/2016  . Mitral valve disease 01/08/2014  . Colon polyps 07/10/2013  . Lumbar radiculopathy 02/04/2011    Past Surgical History:  Procedure Laterality Date  . APPENDECTOMY  child  . AV FISTULA PLACEMENT Right 03/31/2017   Procedure: Right ARM Basilic vein transposition;  Surgeon: Conrad Shartlesville, MD;  Location: [facility] OR;  Service: Vascular;  Laterality: Right;  . AV FISTULA PLACEMENT Right 12/06/2017   Procedure: INSERTION OF ARTERIOVENOUS (AV) GORE-TEX GRAFT ARM RIGHT UPPER EXTREMITY;  Surgeon: [name], MD;  Location: [facility];  Service: Vascular;  Laterality: Right;  . CARDIOVASCULAR STRESS TEST  01/31/2016   Stress myoview: NORMAL/Low risk.  EF 56%.  . [place]  . COLONOSCOPY  2015   + hx of adenomatous polyps.  Need digest health spec in [facility] records to see when pt due for next colonoscopy  .  EYE SURGERY Bilateral    Catarct  . INSERTION OF DIALYSIS CATHETER Right 01/29/2017   Procedure: INSERTION OF DIALYSIS CATHETER- RIGHT INTERNAL JUGULAR;  Surgeon: [name], MD;  Location: [facility];  Service: Vascular;  Laterality: Right;  . INTRAMEDULLARY (IM) NAIL INTERTROCHANTERIC Right 10/04/2018   Procedure: INTRAMEDULLARY (IM) NAIL INTERTROCHANTRIC;  Surgeon: Nicholes Stairs, MD;  Location: [facility];  Service: Orthopedics;  Laterality: Right;  . IR GENERIC HISTORICAL  07/24/2016   IR KYPHO THORACIC WITH BONE BIOPSY 07/24/2016 Luanne Bras, MD [facility]-INTERV RAD  . KYPHOPLASTY  07/27/2016   T8  . PACEMAKER IMPLANT N/A 12/03/2016   Procedure: Pacemaker Implant;  Surgeon: Evans Lance, MD;  Location: [facility] CV LAB;  Service: Cardiovascular;  Laterality: N/A;  . Renal artery dopplers  02/26/2016   Her right renal dimension was 11 cm pole to pole with mild to moderate right renal artery stenosis. A right renal aortic ratio was 3.22 suggesting less than a  50% stenosis.  . Right upper arm AV Gore-Tex graft  12/06/2017   Dr. Donnetta Hutching  . TONSILLECTOMY    . TRANSTHORACIC ECHOCARDIOGRAM  01/29/2016; 11/2016; 12/2017   2017: EF 55-60%, normal LV wall motion, grade I DD.  No cardiac source of emboli was seen.  2018: EF 35-40%, could not assess DD due to a-fib, pulm HTN noted. 12/2017 EF 55-60%, nl wall motion, grd II DD, mod MR and pulm regurg, increased pulm pressure (peak 52).     OB History   No obstetric history on file.      Home Medications    Prior to Admission medications   Medication Sig Start Date End Date Taking? Authorizing Provider  acetaminophen (TYLENOL) 500 MG tablet Take 500-1,000 mg 3 (three) times daily as needed by mouth for moderate pain.     [provider]  amiodarone (PACERONE) 200 MG tablet TAKE 1 TABLET BY MOUTH  DAILY Patient taking differently: Take 200 mg by mouth daily.  04/11/18   Lorretta Harp, MD  aspirin 325 MG tablet Take 1 tablet (325 mg total) by mouth daily. 10/12/18 11/23/18  Donne Hazel, MD  b complex-vitamin c-folic acid (NEPHRO-VITE) 0.8 MG TABS tablet Take 1 tablet by mouth daily. 09/07/18   [provider]  carvedilol (COREG) 3.125 MG tablet TAKE 1 TABLET BY MOUTH TWO  TIMES DAILY Patient taking differently: Take 3.125 mg by mouth 2 (two) times daily with a meal.  06/27/18   Lorretta Harp, MD  docusate sodium (COLACE) 100 MG capsule Take 100 mg by mouth daily.    [provider]  isosorbide mononitrate (IMDUR) 60 MG 24 hr tablet TAKE 1 TABLET BY MOUTH EVERY DAY Patient taking differently: Take 60 mg by mouth daily.  08/29/18   McGowen, Adrian Blackwater, MD  Multiple Vitamins-Minerals (MULTIVITAMIN WITH MINERALS) tablet Take 1 tablet by mouth daily.    [provider]  ondansetron (ZOFRAN) 4 MG tablet Take 1 tablet (4 mg total) by mouth every 8 (eight) hours as needed for nausea or vomiting. 06/03/18   McGowen, Adrian Blackwater, MD  Oxycodone HCl 10 MG TABS Take 1 tablet (10 mg  total) by mouth every 4 (four) hours as needed for up to 7 days (for severe pain). 10/11/18 10/18/18  Nicholes Stairs, MD  polyvinyl alcohol (LIQUIFILM TEARS) 1.4 % ophthalmic solution Place 1-2 drops into both eyes as needed for dry eyes.    [provider]  pravastatin (PRAVACHOL) 40 MG tablet TAKE 1 TABLET  BY MOUTH  EVERY EVENING Patient taking differently: Take 40 mg by mouth daily.  03/09/18   McGowen, Adrian Blackwater, MD  sevelamer carbonate (RENVELA) 800 MG tablet Take 1,600-2,400 mg by mouth See admin instructions. 2400 mg with meals and 1600 with snack    [provider]  vitamin C (ASCORBIC ACID) 500 MG tablet Take 500 mg by mouth daily.    [provider]  Vitamin D, Ergocalciferol, (DRISDOL) 1.25 MG (50000 UT) CAPS capsule Take 1 capsule (50,000 Units total) by mouth every Thursday. 04/28/18   McGowen, Adrian Blackwater, MD    Family History Family History  Problem Relation Age of Onset  . Cancer Mother   . Heart disease Father   . Early death Father   . Sudden Cardiac Death Neg Hx     Social History Social History   Tobacco Use  . Smoking status: Former Smoker    Years: 22.00    Last attempt to quit: 03/17/1972    Years since quitting: 46.6  . Smokeless tobacco: Never Used  Substance Use Topics  . Alcohol use: Yes    Alcohol/week: 1.0 standard drinks    Types: 1 Glasses of wine per week    Comment: wine  . Drug use: No     Allergies   Clonidine derivatives; Adhesive [tape]; Codeine; Nickel; and Sulfa antibiotics   Review of Systems Review of Systems 10 Systems reviewed and are negative for acute change except as noted in the HPI.  Physical Exam Updated Vital Signs BP (!) 152/60 (BP Location: Left Arm)   Pulse 75   Temp 98.7 F (37.1 C) (Oral)   Resp 20   LMP  (LMP Unknown)   SpO2 99%   Physical Exam Constitutional:      Comments: Patient is alert and in no acute distress.  She is physically deconditioned.  Mental status clear.    HENT:     Head: Normocephalic and atraumatic.     Mouth/Throat:     Mouth: Mucous membranes are moist.     Pharynx: Oropharynx is clear.  Eyes:     Extraocular Movements: Extraocular movements intact.  Cardiovascular:     Rate and Rhythm: Normal rate and regular rhythm.  Pulmonary:     Effort: Pulmonary effort is normal.     Breath sounds: Normal breath sounds.  Abdominal:     General: There is no distension.     Palpations: Abdomen is soft.     Tenderness: There is no abdominal tenderness. There is no guarding.  Musculoskeletal:     Comments: Right hip incisions have clean dry dressings on them.  There is no surrounding erythema.  Site looks very good.  Mild, 1+ edema of the right lower extremity.  No calf tenderness.  Feet are warm and dry.  Able to flex and extend the hip.  Patient finds this uncomfortable but intact ROM  Skin:    General: Skin is warm and dry.  Neurological:     General: No focal deficit present.     Mental Status: She is oriented to person, place, and time.     Coordination: Coordination normal.  Psychiatric:        Mood and Affect: Mood normal.      ED Treatments / Results  Labs (all labs ordered are listed, but only abnormal results are displayed) Labs Reviewed  BASIC METABOLIC PANEL - Abnormal; Notable for the following components:      Result Value   Sodium 133 (*)  Chloride 90 (*)    BUN 26 (*)    Creatinine, Ser 3.81 (*)    GFR calc non Af Amer 10 (*)    GFR calc Af Amer 12 (*)    Anion gap 18 (*)    All other components within normal limits  CBC - Abnormal; Notable for the following components:   WBC 12.8 (*)    RBC 2.80 (*)    Hemoglobin 8.5 (*)    HCT 28.1 (*)    MCV 100.4 (*)    All other components within normal limits  POCT I-STAT 7, (LYTES, BLD GAS, ICA,H+H) - Abnormal; Notable for the following components:   pO2, Arterial 57.0 (*)    Bicarbonate 28.3 (*)    Acid-Base Excess 3.0 (*)    Sodium 131 (*)    HCT 25.0 (*)     Hemoglobin 8.5 (*)    All other components within normal limits  BLOOD GAS, ARTERIAL    EKG EKG Interpretation  Date/Time:  Saturday Oct 15 2018 08:18:37 EDT Ventricular Rate:  71 PR Interval:    QRS Duration: 100 QT Interval:  454 QTC Calculation: 494 R Axis:   87 Text Interpretation:  Sinus rhythm no sig change from old Confirmed by Charlesetta Shanks [phone]) on 10/15/2018 8:24:53 AM   Radiology Dg Chest Port 1 View  Result Date: 10/15/2018 CLINICAL DATA:  Short of breath EXAM: PORTABLE CHEST 1 VIEW COMPARISON:  10/08/2018 FINDINGS: Double lead left subclavian pacemaker device and leads are stable. Cardiomegaly. Vascular congestion has increased. Small left pleural effusion. No Kerley B lines to suggest interstitial edema. No pneumothorax. IMPRESSION: Increasing vascular congestion. Stable small left pleural effusion. Electronically Signed   By: Marybelle Killings M.D.   On: 10/15/2018 08:31    Procedures Procedures (including critical care time)  Medications Ordered in ED Medications - No data to display   Initial Impression / Assessment and Plan / ED Course  I have reviewed the triage vital signs and the nursing notes.  Pertinent labs & imaging results that were available during my care of the patient were reviewed by me and considered in my medical decision making (see chart for details).  Clinical Course as of Oct 15 1114  Sat Oct 15, 2018  0945 Social work is reviewing the case.  Patient wants to try home health care assistance.  Social worker spoke with the patient's daughter and the daughter clearly stated that she was not capable of helping her with mobility issues.  Social work trying to get expanded in-home assistance.  Goal is to get patient to afternoon dialysis and in-home health care.   [MP]    Clinical Course User Index [MP] Charlesetta Shanks, MD      Patient presents predominantly for difficulty with pain management and mobility issues at home.  Unfortunately, she  left rehab yesterday evening due to dissatisfaction with the services.  If she is now having difficulty managing mobility issues at home.  Social work has been consulted to try to help the patient get more in-home care.  Her daughter is helping her.  Patient has oxycodone to take.  She did not appear to be in significant pain while in the emergency department.  Certainly rehab and mobility will likely be uncomfortable for the patient and require assistance.  She has follow-up on Tuesday with orthopedics.  She is instructed to readdress these issues.  Consultation been placed for in-home physical therapy and home health.  Patient is due for dialysis this  afternoon.  Reports he typically starts to get a little short of breath when she is due for her sessions.  Is not qualify this is being worse or different than usual.  She denies any cough or chest pain.  Patient's chest x-ray shows mild congestion but objectively she is not in respiratory distress at rest.  Oxygen saturations on room air are variable from 100% to 89% depending upon whether the patient is sleeping.  Nursing initially had the patient on oxygen and was seeing desaturations.  I sat in the room with the patient for approximately 10 minutes off of oxygen.  I worked at the computer and she would intermittently relax and sleep lightly.  Lowest saturation with her sleeping went down to 89 and she picked back up again spontaneously.  Feel that these observed desaturations are relative to the slight sedation with the pain medication the patient had just taken this morning and that she is due for dialysis in the next several hours.  I do feel patient is stable to be transported to her dialysis session without immediate, severe pulmonary edema.  Ultimately after social work consultations, patient's daughter did determine she would take her home and they would try home health care.  Final Clinical Impressions(s) / ED Diagnoses   Final diagnoses:  Right  hip pain  Post-op pain  ESRD (end stage renal disease) on dialysis [facility])    ED Discharge Orders    None       Charlesetta Shanks, MD 10/15/18 1125

## 2018-10-15 NOTE — ED Notes (Signed)
dtr here to pick up pt and take to noon dialysis. Per SW- [facility] has discharged but dtr to take pt home with home health. MD aware

## 2018-10-15 NOTE — ED Notes (Signed)
*  Pt O2 sats dropped to 80% on room air while the pt was awake. Pt placed on 3L O2 via nasal cannula.

## 2018-10-15 NOTE — ED Notes (Signed)
Pt desatted to 81% again while on room air, while awake. Did not improve with deep breathing. O2 turned back on.

## 2018-10-15 NOTE — Progress Notes (Addendum)
10:07AM: CSW was informed patient not eligible for home first with her managed Medicare plan. CSW spoke with patient encouraging patient to consider returning to [facility] to request a transfer to a different SNF when she returns. CSW noted patient was adament not to return and not to go to any facility. CSW shared patient the concerns of her daughter with her returning home. CSW notes patient has not changed her intent to go home at this time. CSW informed daughter who stated she would pick her up and was really worried about patient being able to move around the house.   CSW spoke with patient regarding home health arranged in home. CSW noted patient reported they preferred [facility] but is also open to [facility]. CSW contacted [facility] and confirmed that they can take the patient. CSW informed RN CM if any additional information will be needed for [facility] home health.   CSW received consent to call her daughter. CSW spoke with patient's daughter and noted that is likely not a safe plan as daughter reports after patient's second hip fracture since New Years she has been unable to assist patient in ambulating. CSW will follow up with attending and RNCM.  Lamonte Richer, LCSW, [place] Worker II [phone]

## 2018-10-16 DIAGNOSIS — I120 Hypertensive chronic kidney disease with stage 5 chronic kidney disease or end stage renal disease: Secondary | ICD-10-CM | POA: Diagnosis not present

## 2018-10-16 DIAGNOSIS — N186 End stage renal disease: Secondary | ICD-10-CM | POA: Diagnosis not present

## 2018-10-16 DIAGNOSIS — Z992 Dependence on renal dialysis: Secondary | ICD-10-CM | POA: Diagnosis not present

## 2018-10-17 ENCOUNTER — Ambulatory Visit (INDEPENDENT_AMBULATORY_CARE_PROVIDER_SITE_OTHER): Payer: Medicare Other | Admitting: Family Medicine

## 2018-10-17 ENCOUNTER — Encounter: Payer: Self-pay | Admitting: Family Medicine

## 2018-10-17 ENCOUNTER — Other Ambulatory Visit: Payer: Self-pay

## 2018-10-17 ENCOUNTER — Telehealth: Payer: Self-pay

## 2018-10-17 VITALS — BP 137/57 | HR 76

## 2018-10-17 DIAGNOSIS — S72002D Fracture of unspecified part of neck of left femur, subsequent encounter for closed fracture with routine healing: Secondary | ICD-10-CM | POA: Diagnosis not present

## 2018-10-17 DIAGNOSIS — S3210XD Unspecified fracture of sacrum, subsequent encounter for fracture with routine healing: Secondary | ICD-10-CM | POA: Diagnosis not present

## 2018-10-17 DIAGNOSIS — R5381 Other malaise: Secondary | ICD-10-CM

## 2018-10-17 DIAGNOSIS — I5042 Chronic combined systolic (congestive) and diastolic (congestive) heart failure: Secondary | ICD-10-CM | POA: Diagnosis not present

## 2018-10-17 DIAGNOSIS — I130 Hypertensive heart and chronic kidney disease with heart failure and stage 1 through stage 4 chronic kidney disease, or unspecified chronic kidney disease: Secondary | ICD-10-CM | POA: Diagnosis not present

## 2018-10-17 DIAGNOSIS — M25551 Pain in right hip: Secondary | ICD-10-CM | POA: Diagnosis not present

## 2018-10-17 DIAGNOSIS — D649 Anemia, unspecified: Secondary | ICD-10-CM

## 2018-10-17 DIAGNOSIS — J9621 Acute and chronic respiratory failure with hypoxia: Secondary | ICD-10-CM

## 2018-10-17 DIAGNOSIS — N186 End stage renal disease: Secondary | ICD-10-CM | POA: Diagnosis not present

## 2018-10-17 DIAGNOSIS — Z8781 Personal history of (healed) traumatic fracture: Secondary | ICD-10-CM

## 2018-10-17 DIAGNOSIS — Z992 Dependence on renal dialysis: Secondary | ICD-10-CM

## 2018-10-17 DIAGNOSIS — S32509D Unspecified fracture of unspecified pubis, subsequent encounter for fracture with routine healing: Secondary | ICD-10-CM | POA: Diagnosis not present

## 2018-10-17 MED ORDER — OXYCODONE HCL 5 MG PO TABS: ORAL_TABLET | ORAL | 0 refills | Status: DC

## 2018-10-17 NOTE — Telephone Encounter (Signed)
Patient seen in ER on 10/15/2018 for pain management.  Social work and home health ordered by ER provider.  Patient scheduled for hosp f/u with PCP today.

## 2018-10-17 NOTE — Telephone Encounter (Signed)
SW pt's daughter regarding Aspirin Rx and advised ED MD sent (42,0) on 10/12/18 to [facility]. She will go and pick up Rx.

## 2018-10-17 NOTE — Progress Notes (Addendum)
Virtual Visit via Video Note  I connected with pt on 10/17/18 at  2:00 PM EDT by a video enabled telemedicine application and verified that I am speaking with the correct person using two identifiers.  Location patient: home Location provider:work or home office Persons participating in the virtual visit: patient, pt's daughter, and myself.  I discussed the limitations of evaluation and management by telemedicine and the availability of in person appointments. The patient expressed understanding and agreed to proceed.  Telemedicine visit is a necessity given the COVID-19 restrictions in place at the current time.  HPI: 85 y/o WF being seen today for ED visit 10/15/18 for problems with hip pain mgmt and mobility issues. She was admitted to hospital 5/19-5/26, 2020 for right hip fracture, underwent ORIF 10/04/18. Was discharged on oxycodone for pain control -->to [facility] rehab facility. She chose to self d/c from [facility] rehab facility 10/14/18 (after being there for 3 days) b/c she did not trust them. However, when she got home her daughter realized she could not take care of her. She was stable in the ED on 10/15/18.  She had oxygen sat into upper 80s/low 90s when sleeping/sedated in ED but it was felt this was only brief and no home oxygen was needed.  No new meds were rx'd.  Social work was consulted and [facility] is going to evaluate the pt today.  She has a complex medical history, most prominently-->ESRD on HD T/T/S, hx of chronic hypoxic RF from chronic combined syst/diast HF, HTN, hx of cardioembolic CVA, and a-fib (not on anticoag b/c of recurrent falls) + SSS (has pacemaker).   At a f/u visit in the not-to-distant past she was off oxygen in the office and we determined that she no longer needed it and she returned her oxygen to the DME supplier.  Currently: pain is adequately controlled with 1-2 of the 5mg  oxycodone tabs every 6 hours. She can not get out of bed w/out full  assistance and she can only bear wt on her right hip for < 1 min before it gets too painful.  She cannot ambulate w/out full assistance.  She needs a bedpan but doesn't have one.  She has a bedside commode but daughter cannot safely assist her in transferring.  She has to lie supine b/c doesn't have a hosp bed at home.  Feels mild SOB when supine all the time, has some wheeze intermittently but no cough.  No CP.  No abd pain. She is eating/drinking ok.  She does have constipation and is taking miralax.   She and her daughter deny that she gets significant sedation from taking 2 of her oxycodone 5mg  tabs.   She is using an incentive spirometer. She denies diaphoresis, dizziness, arm or jaw pain, or palpitations.  No melena or hematochezia. She says her oxygen sats are frequently in the mid-to-high 80s since going home.  She has no home oxygen at this time.  ROS: See pertinent positives and negatives per HPI.  Past Medical History:  Diagnosis Date  . Anemia of chronic disease 2018   + anemia of CRI?  [name] Atrophy of left kidney    with absent blood flow by renal artery dopplers (Dr. Gwenlyn Found)  . 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 Chronic combined systolic and diastolic CHF (congestive heart failure) ([place])    a. 11/2016: echo showing EF of 35-40%, RV strain noted, mild MR and mild TR.   Echo showed much improved EF 12/2017 (  55-60%)  . Chronic respiratory failure with hypoxia ([place]) 02/11/2017   Now on chronic O2 (intermittent as of summer 2019).    . Debilitated patient    WC dependent as of 2018  . ESRD on hemodialysis ([place]) 01/2017   T/Th/Sat schedule- [place].  Right basilic AV fistula 25/8527--POE unable to successfully access this.  Dr. Donnetta Hutching to do an upper arm AV graft as of 10/2017..  . History of adenomatous polyp of colon   . History of kidney stones    passed  . History of subarachnoid hemorrhage 10/2014   after syncope and while on xarelto  . HOH (hard of hearing)    . Hyperlipidemia   . Hypertension    Difficult to control, in the setting of one functioning kidney: pt was referred to nephrology by Dr. Gwenlyn Found 06/2015.  . Lumbar radiculopathy 2012  . Lumbar spinal stenosis 2019   facet injections only very short term relief.  L1 and L2 selective nerve root blocks helpful 04/2018.  . Lumbar spondylosis    MR 07/2016---no sign of spinal nerve compression or cord compression.  Pt set up with outpt ortho while admitted to hosp 07/2016.  . Malnourished ([place])   . Metatarsal fracture 06/10/2016   Nondisplaced, left 5th metatarsal--pt was referred to ortho  . Osteopenia 2014   T-score -2.1  . PAF (paroxysmal atrial fibrillation) (HCC)    Eliquis started after BRAO and CVA.  She was changed to warfarin 2018--INR/coumadin mgmt per HD/nephrol.  . Presence of permanent cardiac pacemaker   . Right rib fracture 12/2016   s/p fall  . Sick sinus syndrome ([place]) 11/2016   Dual chamber pacer insertion 2018 (Dr. Lovena Le)  . Stroke ([place]) 42/3536   cardioembolic (had CVA while on no anticoag)--"scattered subacute punctate infarcts: 1 in R parietal lobe and 2 in occipital cortex" on MRI br.  CT angio head/neck: aortic arch athero.  R ICA 20% stenosis, L ICA w/out any stenosis.  . Thoracic back pain 01/2017; 01/2018   2018: Facet?  Dr. Ramos->steroid injection.  01/2018-->CT T spine to check for a new comp fx-->none found.  Selective nerve root inj in T spine helpful on R side.   . Thoracic compression fracture ([place]) 07/2016   T3.  T7 and T8-- T8 kyphoplasty during hosp admission 07/2016.  Neuro referred pt to pain mgmt for consideration of injection 09/2016.  I referred her to endo 08/2016 for consideration of calcitonin treatment.    Past Surgical History:  Procedure Laterality Date  . APPENDECTOMY  child  . AV FISTULA PLACEMENT Right 03/31/2017   Procedure: Right ARM Basilic vein transposition;  Surgeon: Conrad Watkins, MD;  Location: [facility] OR;  Service: Vascular;  Laterality:  Right;  . AV FISTULA PLACEMENT Right 12/06/2017   Procedure: INSERTION OF ARTERIOVENOUS (AV) GORE-TEX GRAFT ARM RIGHT UPPER EXTREMITY;  Surgeon: [name], MD;  Location: [place];  Service: Vascular;  Laterality: Right;  . CARDIOVASCULAR STRESS TEST  01/31/2016   Stress myoview: NORMAL/Low risk.  EF 56%.  . [place]  . COLONOSCOPY  2015   + hx of adenomatous polyps.  Need digest health spec in [place] records to see when pt due for next colonoscopy  . EYE SURGERY Bilateral    Catarct  . INSERTION OF DIALYSIS CATHETER Right 01/29/2017   Procedure: INSERTION OF DIALYSIS CATHETER- RIGHT INTERNAL JUGULAR;  Surgeon: [name], MD;  Location: [place];  Service: Vascular;  Laterality: Right;  . INTRAMEDULLARY (  IM) NAIL INTERTROCHANTERIC Right 10/04/2018   Procedure: INTRAMEDULLARY (IM) NAIL INTERTROCHANTRIC;  Surgeon: Nicholes Stairs, MD;  Location: [place];  Service: Orthopedics;  Laterality: Right;  . IR GENERIC HISTORICAL  07/24/2016   IR KYPHO THORACIC WITH BONE BIOPSY 07/24/2016 Luanne Bras, MD [facility]-INTER[facility] RAD  . KYPHOPLASTY  07/27/2016   T8  . PACEMAKER IMPLANT N/A 12/03/2016   Procedure: Pacemaker Implant;  Surgeon: Evans Lance, MD;  Location: [place] CV LAB;  Service: Cardiovascular;  Laterality: N/A;  . Renal artery dopplers  02/26/2016   Her right renal dimension was 11 cm pole to pole with mild to moderate right renal artery stenosis. A right renal aortic ratio was 3.22 suggesting less than a 50% stenosis.  . Right upper arm AV Gore-Tex graft  12/06/2017   Dr. Donnetta Hutching  . TONSILLECTOMY    . TRANSTHORACIC ECHOCARDIOGRAM  01/29/2016; 11/2016; 12/2017   2017: EF 55-60%, normal LV wall motion, grade I DD.  No cardiac source of emboli was seen.  2018: EF 35-40%, could not assess DD due to a-fib, pulm HTN noted. 12/2017 EF 55-60%, nl wall motion, grd II DD, mod MR and pulm regurg, increased pulm pressure (peak 52).    Family History  Problem Relation Age of Onset  .  Cancer Mother   . Heart disease Father   . Early death Father   . 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   Current Outpatient Medications:  .  acetaminophen (TYLENOL) 500 MG tablet, Take 500-1,000 mg 3 (three) times daily as needed by mouth for moderate pain. , Disp: , Rfl:  .  amiodarone (PACERONE) 200 MG tablet, TAKE 1 TABLET BY MOUTH  DAILY (Patient taking differently: Take 200 mg by mouth daily. ), Disp: 90 tablet, Rfl: 2 .  aspirin 325 MG tablet, Take 1 tablet (325 mg total) by mouth daily., Disp: 42 tablet, Rfl: 0 .  b complex-vitamin c-folic acid (NEPHRO-VITE) 0.8 MG TABS tablet, Take 1 tablet by mouth daily., Disp: , Rfl:  .  carvedilol (COREG) 3.125 MG tablet, TAKE 1 TABLET BY MOUTH TWO  TIMES DAILY (Patient taking differently: Take 3.125 mg by mouth 2 (two) times daily with a meal. ), Disp: 180 tablet, Rfl: 1 .  docusate sodium (COLACE) 100 MG capsule, Take 100 mg by mouth daily., Disp: , Rfl:  .  isosorbide mononitrate (IMDUR) 60 MG 24 hr tablet, TAKE 1 TABLET BY MOUTH EVERY DAY (Patient taking differently: Take 60 mg by mouth daily. ), Disp: 30 tablet, Rfl: 1 .  Multiple Vitamins-Minerals (MULTIVITAMIN WITH MINERALS) tablet, Take 1 tablet by mouth daily., Disp: , Rfl:  .  ondansetron (ZOFRAN) 4 MG tablet, Take 1 tablet (4 mg total) by mouth every 8 (eight) hours as needed for nausea or vomiting., Disp: 60 tablet, Rfl: 1 .  Oxycodone HCl 10 MG TABS, Take 1 tablet (10 mg total) by mouth every 4 (four) hours as needed for up to 7 days (for severe pain)., Disp: 40 tablet, Rfl: 0 .  polyvinyl alcohol (LIQUIFILM TEARS) 1.4 % ophthalmic solution, Place 1-2 drops into both eyes as needed for dry eyes., Disp: , Rfl:  .  pravastatin (PRAVACHOL) 40 MG tablet, TAKE 1 TABLET BY MOUTH  EVERY EVENING (Patient taking differently: Take 40 mg by mouth daily. ), Disp: 90 tablet, Rfl: 1 .  sevelamer carbonate (RENVELA) 800 MG  tablet, Take 1,600-2,400 mg by mouth See  admin instructions. 2400 mg with meals and 1600 with snack, Disp: , Rfl:  .  vitamin C (ASCORBIC ACID) 500 MG tablet, Take 500 mg by mouth daily., Disp: , Rfl:  .  Vitamin D, Ergocalciferol, (DRISDOL) 1.25 MG (50000 UT) CAPS capsule, Take 1 capsule (50,000 Units total) by mouth every Thursday., Disp: 12 capsule, Rfl: 3  EXAM:  VITALS per patient if applicable: BP (!) 681/15 (BP Location: Left Arm, Patient Position: Sitting, Cuff Size: Normal)   Pulse 76   LMP  (LMP Unknown)    GENERAL: alert, oriented, appears well and in no acute distress  HEENT: atraumatic, conjunttiva clear, no obvious abnormalities on inspection of external nose and ears  NECK: normal movements of the head and neck  LUNGS: on inspection no signs of respiratory distress, breathing rate appears normal, no obvious gross SOB, gasping or wheezing  CV: no obvious cyanosis  MS: moves all visible extremities without noticeable abnormality  PSYCH/NEURO: pleasant and cooperative, no obvious depression or anxiety, speech and thought processing grossly intact  LABS: none today    Chemistry      Component Value Date/Time   NA 131 (L) 10/15/2018 0816   NA 138 06/23/2018   K 3.7 10/15/2018 0816   CL 90 (L) 10/15/2018 0759   CO2 25 10/15/2018 0759   BUN 26 (H) 10/15/2018 0759   BUN 30 (A) 06/23/2018   CREATININE 3.81 (H) 10/15/2018 0759   CREATININE 1.63 (H) 03/24/2016 0110   GLU 128 09/01/2016      Component Value Date/Time   CALCIUM 9.0 10/15/2018 0759   CALCIUM 9.5 02/16/2017 0232   ALKPHOS 59 07/18/2018 2236   AST 17 07/18/2018 2236   ALT 9 07/18/2018 2236   BILITOT 0.5 07/18/2018 2236   BILITOT 0.2 06/17/2016 0819      Lab Results  Component Value Date   WBC 12.8 (H) 10/15/2018   HGB 8.5 (L) 10/15/2018   HCT 25.0 (L) 10/15/2018   MCV 100.4 (H) 10/15/2018   PLT 349 10/15/2018   (The above labs were done in the ED a few hours prior to getting HD at her outpt  dialysis center)  ASSESSMENT AND PLAN:  Discussed the following assessment and plan:  1) Right hip fracture: s/p ORIF.  Acute pain managed pretty well with oxycodone 5-10 mg q6h. I rx'd this med for her today, #180 tabs. She knows to try to stay on top her constipation with miralax. She is getting HH eval by Alvis Lemmings and will need nursing, PT, and OT. She needs DME: hospital bed, trapeze bar, bed pan.  2) Chronic hypoxic RF: I don't think she has decompensated HF, but likely has atelectasis. Encouraged ongoing use of incentive spirometry and we'll get HH to get her on home oxygen. She is in no shape to come to my office or go to ED and she won't even consider doing this at this time.    3) ESRD, on HD.  She will continue with this on T/Th/Sat.  4) Postop anemia superimposed on anemia of CRI. Hb stable since d/c from hosp. She will get periodic Hb check via her dialysis clinic.   I discussed the assessment and treatment plan with the patient. The patient was provided an opportunity to ask questions and all were answered. The patient agreed with the plan and demonstrated an understanding of the instructions.   The patient was advised to call back or seek an in-person evaluation if the symptoms worsen or if the condition fails  to improve as anticipated.  Spent 30 min with pt today, with >50% of this time spent in counseling and care coordination regarding the above problems.  F/u: 3 d f/u hypoxia and pain control and see the extent of the [facility] services she is able to get.  Signed:  Crissie Sickles, MD           10/17/2018  ADDENDUM 10/21/2018:  Patient's pulse ox on room air at the time of this visit on 10/17/18 was 85%.  She is not able to ambulate. She qualifies for home oxygen therapy, dx is chronic hypoxic RF due to chronic combined systolic and diastolic heart failure.  She is on maximum medical therapy and is not acutely decompensated. She will need continuous oxygen at 3L nasal cannulae  indefinitely.  Signed:  Crissie Sickles, MD           10/21/2018   ADDENDUM 10/21/2018: Patient's home health nurse went to her home yesterday and checked her oxygen sat on room air and it was 85%.   Signed:  Crissie Sickles, MD           10/21/2018

## 2018-10-18 DIAGNOSIS — E876 Hypokalemia: Secondary | ICD-10-CM | POA: Diagnosis not present

## 2018-10-18 DIAGNOSIS — I5042 Chronic combined systolic (congestive) and diastolic (congestive) heart failure: Secondary | ICD-10-CM | POA: Diagnosis not present

## 2018-10-18 DIAGNOSIS — E8770 Fluid overload, unspecified: Secondary | ICD-10-CM | POA: Diagnosis not present

## 2018-10-18 DIAGNOSIS — G894 Chronic pain syndrome: Secondary | ICD-10-CM | POA: Diagnosis not present

## 2018-10-18 DIAGNOSIS — D689 Coagulation defect, unspecified: Secondary | ICD-10-CM | POA: Diagnosis not present

## 2018-10-18 DIAGNOSIS — D631 Anemia in chronic kidney disease: Secondary | ICD-10-CM | POA: Diagnosis not present

## 2018-10-18 DIAGNOSIS — N2581 Secondary hyperparathyroidism of renal origin: Secondary | ICD-10-CM | POA: Diagnosis not present

## 2018-10-18 DIAGNOSIS — N186 End stage renal disease: Secondary | ICD-10-CM | POA: Diagnosis not present

## 2018-10-18 NOTE — Telephone Encounter (Signed)
Pls get with Maudie Mercury about this. Hoyer lift is fine with me (as well as hospital bed, trapeze bar, and bedpan). Maudie Mercury was getting with Alvis Lemmings yesterday to reconcile any disconnect between the services Alvis Lemmings was planning vs the services the patient needs.--thx

## 2018-10-19 ENCOUNTER — Encounter: Payer: Self-pay | Admitting: Family Medicine

## 2018-10-19 ENCOUNTER — Telehealth: Payer: Self-pay | Admitting: Family Medicine

## 2018-10-19 DIAGNOSIS — I130 Hypertensive heart and chronic kidney disease with heart failure and stage 1 through stage 4 chronic kidney disease, or unspecified chronic kidney disease: Secondary | ICD-10-CM | POA: Diagnosis not present

## 2018-10-19 DIAGNOSIS — I5042 Chronic combined systolic (congestive) and diastolic (congestive) heart failure: Secondary | ICD-10-CM | POA: Diagnosis not present

## 2018-10-19 DIAGNOSIS — S72002D Fracture of unspecified part of neck of left femur, subsequent encounter for closed fracture with routine healing: Secondary | ICD-10-CM | POA: Diagnosis not present

## 2018-10-19 DIAGNOSIS — S3210XD Unspecified fracture of sacrum, subsequent encounter for fracture with routine healing: Secondary | ICD-10-CM | POA: Diagnosis not present

## 2018-10-19 DIAGNOSIS — S32509D Unspecified fracture of unspecified pubis, subsequent encounter for fracture with routine healing: Secondary | ICD-10-CM | POA: Diagnosis not present

## 2018-10-19 NOTE — Telephone Encounter (Signed)
Patient daughter, [name] stated that she is returning a call.  Please contact her at [phone].  Thank you

## 2018-10-19 NOTE — Telephone Encounter (Signed)
Spoke with patients daughter, Bryson Ha.  Daughter reports patient's mobility is poor and she is weak. Pulse oximetry is often in the 80s.  Advised daughter, if patient is in respiratory distress, to call EMS. Encouraged use of incentive spirometry to increase lung volume. Bryson Ha verbalized understanding.  Also informed daughter of home health order placed through [facility]. She is very appreciative of any assistance provided.  F/U with PCP is Friday, 10/21/2018 via virtual visit.

## 2018-10-19 NOTE — Telephone Encounter (Signed)
LM requesting call back, direct phone line provided.

## 2018-10-19 NOTE — Telephone Encounter (Signed)
Voicemail received on front desk phone from home health. Therapist (I could not understand his name on the message) asked for additional orders for an RN for R hipe wound care, and a home health aid to assist with bathing whether it be a shower or bedside bathing. There was a third request but I could not understand what the therapist said.

## 2018-10-19 NOTE — Telephone Encounter (Signed)
Spoke with Barth Kirks [facility] Physical Therapist).  Per PCP, verbal order to continue PT provided. Verbal order for nursing (wound care, [place] aide and supplies) provided. Patient already has hospital bed, Barth Kirks states family has decided against hoyer lift at this time as they are more comfortable with patient transfers.

## 2018-10-19 NOTE — Telephone Encounter (Signed)
Noted  

## 2018-10-19 NOTE — Telephone Encounter (Signed)
Sent to [employer]

## 2018-10-19 NOTE — Telephone Encounter (Signed)
Noted. Will verify at f/u whether or not [facility] nursing started oxygen or not. Signed:  Crissie Sickles, MD           10/19/2018

## 2018-10-19 NOTE — Telephone Encounter (Signed)
LM requesting call back.  

## 2018-10-20 ENCOUNTER — Telehealth: Payer: Self-pay | Admitting: Family Medicine

## 2018-10-20 DIAGNOSIS — S3210XD Unspecified fracture of sacrum, subsequent encounter for fracture with routine healing: Secondary | ICD-10-CM | POA: Diagnosis not present

## 2018-10-20 DIAGNOSIS — I130 Hypertensive heart and chronic kidney disease with heart failure and stage 1 through stage 4 chronic kidney disease, or unspecified chronic kidney disease: Secondary | ICD-10-CM | POA: Diagnosis not present

## 2018-10-20 DIAGNOSIS — J9621 Acute and chronic respiratory failure with hypoxia: Secondary | ICD-10-CM

## 2018-10-20 DIAGNOSIS — S72002D Fracture of unspecified part of neck of left femur, subsequent encounter for closed fracture with routine healing: Secondary | ICD-10-CM | POA: Diagnosis not present

## 2018-10-20 DIAGNOSIS — E876 Hypokalemia: Secondary | ICD-10-CM | POA: Diagnosis not present

## 2018-10-20 DIAGNOSIS — D689 Coagulation defect, unspecified: Secondary | ICD-10-CM | POA: Diagnosis not present

## 2018-10-20 DIAGNOSIS — S32509D Unspecified fracture of unspecified pubis, subsequent encounter for fracture with routine healing: Secondary | ICD-10-CM | POA: Diagnosis not present

## 2018-10-20 DIAGNOSIS — I5042 Chronic combined systolic (congestive) and diastolic (congestive) heart failure: Secondary | ICD-10-CM | POA: Diagnosis not present

## 2018-10-20 DIAGNOSIS — D631 Anemia in chronic kidney disease: Secondary | ICD-10-CM | POA: Diagnosis not present

## 2018-10-20 DIAGNOSIS — E8770 Fluid overload, unspecified: Secondary | ICD-10-CM | POA: Diagnosis not present

## 2018-10-20 DIAGNOSIS — N186 End stage renal disease: Secondary | ICD-10-CM | POA: Diagnosis not present

## 2018-10-20 DIAGNOSIS — N2581 Secondary hyperparathyroidism of renal origin: Secondary | ICD-10-CM | POA: Diagnosis not present

## 2018-10-20 NOTE — Telephone Encounter (Signed)
Valerie Foster, RN from [facility] called stating that at her 1st nurse's visit with the pt today, her oxygen sats were in the upper 80s with the  highest sat 90% at 0930; the pt daughter also said that they have had a problem with the pt's sats; she would like a prescription for prn oxygen for the pt; Valerie Terrell says that the pt was on oxygen before but it was discontinued; also the pt was to have another shingles vaccine but because of the [place] pandemic, the pt has not been able to have it done at the pharmacy; she would like for a  prescription for shingles shot so that she can administer it to the to be given at home; the pt was previously in rehab, but left after a few days because she/her family were dissatisfied; Valerie Terrell says that she feels that the pt is having this difficulty may be related to her right hip pain, and she is staying in bed; the pt also has incentive spirometry at home;  Alma Downs can be contacted at [phone], and a voicemail can be left;  will route to office for final disposition; the pt normally sees Dr Anitra Lauth, [facility].

## 2018-10-20 NOTE — Telephone Encounter (Signed)
Yes, this is fine.

## 2018-10-20 NOTE — Addendum Note (Signed)
Addended by: Gerilyn Nestle on: 10/20/2018 01:23 PM   Modules accepted: Orders

## 2018-10-20 NOTE — Telephone Encounter (Signed)
Spoke with [facility] RN). Per PCP (verbal), verbal order given for oxygen and Shingrix. Advised second Shingrix vaccine is available at pharmacy for daughter to pick up. Written order for oxygen needs faxed to [place] ([facility]) @ [phone].

## 2018-10-21 ENCOUNTER — Ambulatory Visit (INDEPENDENT_AMBULATORY_CARE_PROVIDER_SITE_OTHER): Payer: Medicare Other | Admitting: Family Medicine

## 2018-10-21 ENCOUNTER — Encounter: Payer: Self-pay | Admitting: Family Medicine

## 2018-10-21 ENCOUNTER — Other Ambulatory Visit: Payer: Self-pay

## 2018-10-21 VITALS — BP 118/64 | HR 65

## 2018-10-21 DIAGNOSIS — Z789 Other specified health status: Secondary | ICD-10-CM | POA: Diagnosis not present

## 2018-10-21 DIAGNOSIS — J9611 Chronic respiratory failure with hypoxia: Secondary | ICD-10-CM | POA: Diagnosis not present

## 2018-10-21 DIAGNOSIS — J9621 Acute and chronic respiratory failure with hypoxia: Secondary | ICD-10-CM | POA: Diagnosis not present

## 2018-10-21 DIAGNOSIS — Z8781 Personal history of (healed) traumatic fracture: Secondary | ICD-10-CM

## 2018-10-21 DIAGNOSIS — S3210XD Unspecified fracture of sacrum, subsequent encounter for fracture with routine healing: Secondary | ICD-10-CM | POA: Diagnosis not present

## 2018-10-21 DIAGNOSIS — S32509D Unspecified fracture of unspecified pubis, subsequent encounter for fracture with routine healing: Secondary | ICD-10-CM | POA: Diagnosis not present

## 2018-10-21 DIAGNOSIS — S72001D Fracture of unspecified part of neck of right femur, subsequent encounter for closed fracture with routine healing: Secondary | ICD-10-CM

## 2018-10-21 DIAGNOSIS — R5381 Other malaise: Secondary | ICD-10-CM

## 2018-10-21 DIAGNOSIS — M25551 Pain in right hip: Secondary | ICD-10-CM

## 2018-10-21 DIAGNOSIS — Z9889 Other specified postprocedural states: Secondary | ICD-10-CM | POA: Diagnosis not present

## 2018-10-21 DIAGNOSIS — I130 Hypertensive heart and chronic kidney disease with heart failure and stage 1 through stage 4 chronic kidney disease, or unspecified chronic kidney disease: Secondary | ICD-10-CM | POA: Diagnosis not present

## 2018-10-21 DIAGNOSIS — J9601 Acute respiratory failure with hypoxia: Secondary | ICD-10-CM | POA: Diagnosis not present

## 2018-10-21 DIAGNOSIS — S72002D Fracture of unspecified part of neck of left femur, subsequent encounter for closed fracture with routine healing: Secondary | ICD-10-CM | POA: Diagnosis not present

## 2018-10-21 DIAGNOSIS — I5042 Chronic combined systolic (congestive) and diastolic (congestive) heart failure: Secondary | ICD-10-CM | POA: Diagnosis not present

## 2018-10-21 NOTE — Progress Notes (Signed)
Virtual Visit via Video Note  I connected with Valerie Terrell on 10/21/18 at  1:30 PM EDT by a video enabled telemedicine application and verified that I am speaking with the correct person using two identifiers.  Location patient: home Location provider:work or home office Persons participating in the virtual visit: patient, her daughter Fenna Semel, and myself.  I discussed the limitations of evaluation and management by telemedicine and the availability of in person appointments. The patient expressed understanding and agreed to proceed.  Telemedicine visit is a necessity given the COVID-19 restrictions in place at the current time.  HPI: 85 y/o WF being seen today for 4 day f/u acute postoperative R hip pain s/p ORIF about 3 wks ago + chronic hypoxic RF. We have been in the arduous process of getting her on home oxygen, getting approp DME to support her in her home, [facility] nursing assistance, and Valerie Terrell/OT. Last visit her pain was significant but controlled by oxycodone 5-10mg  q6h.  Interim hx: Taking 2 oxy pills and sometimes 3 at a time recently --yesterday and last night were times of bad pain, seems better today.  She had surgery f/u today, was able to help get into the chair and stand---better than she had been. Her incision looked good and he took her staples out.  Next f/u 4 wks. She has hosp bed now and this is very helpful, makes transfers much easier.  Also an over the bed table.    ROS: no acute SOB, no cough, no wheezing, no fevers.  Past Medical History:  Diagnosis Date  . Anemia of chronic disease 2018   + anemia of CRI?  [name] Atrophy of left kidney    with absent blood flow by renal artery dopplers (Dr. Gwenlyn Found)  . 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 Chronic combined systolic and diastolic CHF (congestive heart failure) ([place])    a. 11/2016: echo showing EF of 35-40%, RV strain noted, mild MR and mild TR.   Echo showed much improved EF 12/2017 (55-60%)  . Chronic  respiratory failure with hypoxia ([place]) 02/11/2017   Now on chronic O2 (intermittent as of summer 2019).    . Closed right hip fracture ([place]) 10/04/2018   ORIF  . Debilitated patient    WC dependent as of 2018  . ESRD on hemodialysis ([place]) 01/2017   T/Th/Sat schedule- [place].  Right basilic AV fistula [id number]/6767--MCN unable to successfully access this.  Dr. Donnetta Hutching to do an upper arm AV graft as of 10/2017..  . History of adenomatous polyp of colon   . History of kidney stones    passed  . History of subarachnoid hemorrhage 10/2014   after syncope and while on xarelto  . HOH (hard of hearing)   . Hyperlipidemia   . Hypertension    Difficult to control, in the setting of one functioning kidney: Valerie Terrell was referred to nephrology by Dr. Gwenlyn Found 06/2015.  . Lumbar radiculopathy 2012  . Lumbar spinal stenosis 2019   facet injections only very short term relief.  L1 and L2 selective nerve root blocks helpful 04/2018.  . Lumbar spondylosis    MR 07/2016---no sign of spinal nerve compression or cord compression.  Valerie Terrell set up with outpt ortho while admitted to hosp 07/2016.  . Malnourished ([place])   . Metatarsal fracture 06/10/2016   Nondisplaced, left 5th metatarsal--Valerie Terrell was referred to ortho  . Osteopenia 2014   T-score -2.1  . PAF (paroxysmal atrial fibrillation) ([facility])    Eliquis  started after BRAO and CVA.  She was changed to warfarin 2018--INR/coumadin mgmt per HD/nephrol.  . Presence of permanent cardiac pacemaker   . Right rib fracture 12/2016   s/p fall  . Sick sinus syndrome ([place]) 11/2016   Dual chamber pacer insertion 2018 (Dr. Lovena Le)  . Stroke ([place]) 02/2584   cardioembolic (had CVA while on no anticoag)--"scattered subacute punctate infarcts: 1 in R parietal lobe and 2 in occipital cortex" on MRI br.  CT angio head/neck: aortic arch athero.  R ICA 20% stenosis, L ICA w/out any stenosis.  . Thoracic back pain 01/2017; 01/2018   2018: Facet?  Dr. Ramos->steroid injection.  01/2018-->CT T  spine to check for a new comp fx-->none found.  Selective nerve root inj in T spine helpful on R side.   . Thoracic compression fracture ([place]) 07/2016   T3.  T7 and T8-- T8 kyphoplasty during hosp admission 07/2016.  Neuro referred Valerie Terrell to pain mgmt for consideration of injection 09/2016.  I referred her to endo 08/2016 for consideration of calcitonin treatment.    Past Surgical History:  Procedure Laterality Date  . APPENDECTOMY  child  . AV FISTULA PLACEMENT Right 03/31/2017   Procedure: Right ARM Basilic vein transposition;  Surgeon: Conrad San Jon, MD;  Location: [facility] OR;  Service: Vascular;  Laterality: Right;  . AV FISTULA PLACEMENT Right 12/06/2017   Procedure: INSERTION OF ARTERIOVENOUS (AV) GORE-TEX GRAFT ARM RIGHT UPPER EXTREMITY;  Surgeon: [name], MD;  Location: [place] [facility];  Service: Vascular;  Laterality: Right;  . CARDIOVASCULAR STRESS TEST  01/31/2016   Stress myoview: NORMAL/Low risk.  EF 56%.  . [place]  . COLONOSCOPY  2015   + hx of adenomatous polyps.  Need digest health spec in [place] records to see when Valerie Terrell due for next colonoscopy  . EYE SURGERY Bilateral    Catarct  . INSERTION OF DIALYSIS CATHETER Right 01/29/2017   Procedure: INSERTION OF DIALYSIS CATHETER- RIGHT INTERNAL JUGULAR;  Surgeon: [name], MD;  Location: [facility];  Service: Vascular;  Laterality: Right;  . INTRAMEDULLARY (IM) NAIL INTERTROCHANTERIC Right 10/04/2018   Procedure: INTRAMEDULLARY (IM) NAIL INTERTROCHANTRIC;  Surgeon: Nicholes Stairs, MD;  Location: [facility];  Service: Orthopedics;  Laterality: Right;  . IR GENERIC HISTORICAL  07/24/2016   IR KYPHO THORACIC WITH BONE BIOPSY 07/24/2016 Luanne Bras, MD [facility]-INTER[facility] RAD  . KYPHOPLASTY  07/27/2016   T8  . PACEMAKER IMPLANT N/A 12/03/2016   Procedure: Pacemaker Implant;  Surgeon: Evans Lance, MD;  Location: [facility];  Service: Cardiovascular;  Laterality: N/A;  . Renal artery dopplers  02/26/2016   Her right renal  dimension was 11 cm pole to pole with mild to moderate right renal artery stenosis. A right renal aortic ratio was 3.22 suggesting less than a 50% stenosis.  . Right upper arm AV Gore-Tex graft  12/06/2017   Dr. Donnetta Hutching  . TONSILLECTOMY    . TRANSTHORACIC ECHOCARDIOGRAM  01/29/2016; 11/2016; 12/2017   2017: EF 55-60%, normal LV wall motion, grade I DD.  No cardiac source of emboli was seen.  2018: EF 35-40%, could not assess DD due to a-fib, pulm HTN noted. 12/2017 EF 55-60%, nl wall motion, grd II DD, mod MR and pulm regurg, increased pulm pressure (peak 52).    Family History  Problem Relation Age of Onset  . Cancer Mother   . Heart disease Father   . Early death Father   . Sudden Cardiac Death Neg  Hx      Current Outpatient Medications:  .  acetaminophen (TYLENOL) 500 MG tablet, Take 500-1,000 mg 3 (three) times daily as needed by mouth for moderate pain. , Disp: , Rfl:  .  amiodarone (PACERONE) 200 MG tablet, TAKE 1 TABLET BY MOUTH  DAILY (Patient taking differently: Take 200 mg by mouth daily. ), Disp: 90 tablet, Rfl: 2 .  aspirin 325 MG tablet, Take 1 tablet (325 mg total) by mouth daily., Disp: 42 tablet, Rfl: 0 .  b complex-vitamin c-folic acid (NEPHRO-VITE) 0.8 MG TABS tablet, Take 1 tablet by mouth daily., Disp: , Rfl:  .  carvedilol (COREG) 3.125 MG tablet, TAKE 1 TABLET BY MOUTH TWO  TIMES DAILY (Patient taking differently: Take 3.125 mg by mouth 2 (two) times daily with a meal. ), Disp: 180 tablet, Rfl: 1 .  docusate sodium (COLACE) 100 MG capsule, Take 100 mg by mouth daily., Disp: , Rfl:  .  isosorbide mononitrate (IMDUR) 60 MG 24 hr tablet, TAKE 1 TABLET BY MOUTH EVERY DAY (Patient taking differently: Take 60 mg by mouth daily. ), Disp: 30 tablet, Rfl: 1 .  Multiple Vitamins-Minerals (MULTIVITAMIN WITH MINERALS) tablet, Take 1 tablet by mouth daily., Disp: , Rfl:  .  ondansetron (ZOFRAN) 4 MG tablet, Take 1 tablet (4 mg total) by mouth every 8 (eight) hours as needed for nausea  or vomiting., Disp: 60 tablet, Rfl: 1 .  oxyCODONE (OXY IR/ROXICODONE) 5 MG immediate release tablet, 1-2 tabs po q6h prn pain, Disp: 180 tablet, Rfl: 0 .  polyvinyl alcohol (LIQUIFILM TEARS) 1.4 % ophthalmic solution, Place 1-2 drops into both eyes as needed for dry eyes., Disp: , Rfl:  .  pravastatin (PRAVACHOL) 40 MG tablet, TAKE 1 TABLET BY MOUTH  EVERY EVENING (Patient taking differently: Take 40 mg by mouth daily. ), Disp: 90 tablet, Rfl: 1 .  sevelamer carbonate (RENVELA) 800 MG tablet, Take 1,600-2,400 mg by mouth See admin instructions. 2400 mg with meals and 1600 with snack, Disp: , Rfl:  .  vitamin C (ASCORBIC ACID) 500 MG tablet, Take 500 mg by mouth daily., Disp: , Rfl:  .  Vitamin D, Ergocalciferol, (DRISDOL) 1.25 MG (50000 UT) CAPS capsule, Take 1 capsule (50,000 Units total) by mouth every Thursday., Disp: 12 capsule, Rfl: 3  EXAM:  VITALS per patient if applicable: BP 952/84 (BP Location: Left Arm, Patient Position: Sitting, Cuff Size: Normal)   Pulse 65   LMP  (LMP Unknown)    GENERAL: alert, oriented, appears well and in no acute distress  HEENT: atraumatic, conjunttiva clear, no obvious abnormalities on inspection of external nose and ears  NECK: normal movements of the head and neck  LUNGS: on inspection no signs of respiratory distress, breathing rate appears normal, no obvious gross SOB, gasping or wheezing  CV: no obvious cyanosis  MS: moves all visible extremities without noticeable abnormality  PSYCH/NEURO: pleasant and cooperative, no obvious depression or anxiety, speech and thought processing grossly intact  LABS: none today    Chemistry      Component Value Date/Time   NA 131 (L) 10/15/2018 0816   NA 138 06/23/2018   K 3.7 10/15/2018 0816   CL 90 (L) 10/15/2018 0759   CO2 25 10/15/2018 0759   BUN 26 (H) 10/15/2018 0759   BUN 30 (A) 06/23/2018   CREATININE 3.81 (H) 10/15/2018 0759   CREATININE 1.63 (H) 03/24/2016 0110   GLU 128 09/01/2016       Component Value Date/Time   CALCIUM  9.0 10/15/2018 0759   CALCIUM 9.5 02/16/2017 0232   ALKPHOS 59 07/18/2018 2236   AST 17 07/18/2018 2236   ALT 9 07/18/2018 2236   BILITOT 0.5 07/18/2018 2236   BILITOT 0.2 06/17/2016 0819     Lab Results  Component Value Date   WBC 12.8 (H) 10/15/2018   HGB 8.5 (L) 10/15/2018   HCT 25.0 (L) 10/15/2018   MCV 100.4 (H) 10/15/2018   PLT 349 10/15/2018   Lab Results  Component Value Date   IRON 60 06/23/2018   TIBC 193 06/23/2018   FERRITIN 516 06/10/2017   Lab Results  Component Value Date   HGBA1C 5.2 12/15/2016   Lab Results  Component Value Date   TSH 1.097 12/15/2017   Lab Results  Component Value Date   CHOL 194 03/28/2018   HDL 62.10 03/28/2018   LDLCALC 72 06/17/2016   LDLDIRECT 98.0 03/28/2018   TRIG 210.0 (H) 03/28/2018   CHOLHDL 3 03/28/2018   ASSESSMENT AND PLAN:  Discussed the following assessment and plan:  1) Right hip pain; recent fracture and ORIF. She is happier since getting hosp bed and bed table and is started with Valerie Terrell. Pain control is adequate most of the time when taking 2 oxyc 5mg  tabs at a time.  Occ takes 3 tabs. No adverse side effects.  Ortho f/u went well, staples are out and wound is good.  Ortho f/u 1 mo, just continuing with Valerie Terrell in the meantime.  2) Chronic hypoxic resp failur (combined syst/diast CHF): no acute decompensation. She should be getting started on continuous oxygen via [place] at 3L/min this afternoon. O2 sat goal is >92%.   I discussed the assessment and treatment plan with the patient. The patient was provided an opportunity to ask questions and all were answered. The patient agreed with the plan and demonstrated an understanding of the instructions.   The patient was advised to call back or seek an in-person evaluation if the symptoms worsen or if the condition fails to improve as anticipated.  F/u: 4 wks f/u pain, chf/resp fail.  May need repeat CBC at that time if nephrol has not  done one already.  Signed:  Crissie Sickles, MD           10/21/2018

## 2018-10-22 ENCOUNTER — Encounter: Payer: Self-pay | Admitting: Family Medicine

## 2018-10-22 DIAGNOSIS — E8770 Fluid overload, unspecified: Secondary | ICD-10-CM | POA: Diagnosis not present

## 2018-10-22 DIAGNOSIS — D689 Coagulation defect, unspecified: Secondary | ICD-10-CM | POA: Diagnosis not present

## 2018-10-22 DIAGNOSIS — E876 Hypokalemia: Secondary | ICD-10-CM | POA: Diagnosis not present

## 2018-10-22 DIAGNOSIS — N186 End stage renal disease: Secondary | ICD-10-CM | POA: Diagnosis not present

## 2018-10-22 DIAGNOSIS — N2581 Secondary hyperparathyroidism of renal origin: Secondary | ICD-10-CM | POA: Diagnosis not present

## 2018-10-22 DIAGNOSIS — D631 Anemia in chronic kidney disease: Secondary | ICD-10-CM | POA: Diagnosis not present

## 2018-10-24 DIAGNOSIS — S32509D Unspecified fracture of unspecified pubis, subsequent encounter for fracture with routine healing: Secondary | ICD-10-CM | POA: Diagnosis not present

## 2018-10-24 DIAGNOSIS — I130 Hypertensive heart and chronic kidney disease with heart failure and stage 1 through stage 4 chronic kidney disease, or unspecified chronic kidney disease: Secondary | ICD-10-CM | POA: Diagnosis not present

## 2018-10-24 DIAGNOSIS — S72002D Fracture of unspecified part of neck of left femur, subsequent encounter for closed fracture with routine healing: Secondary | ICD-10-CM | POA: Diagnosis not present

## 2018-10-24 DIAGNOSIS — S3210XD Unspecified fracture of sacrum, subsequent encounter for fracture with routine healing: Secondary | ICD-10-CM | POA: Diagnosis not present

## 2018-10-24 DIAGNOSIS — I5042 Chronic combined systolic (congestive) and diastolic (congestive) heart failure: Secondary | ICD-10-CM | POA: Diagnosis not present

## 2018-10-24 NOTE — Telephone Encounter (Signed)
LM requesting call back. Direct number provided.

## 2018-10-24 NOTE — Telephone Encounter (Signed)
Spoke with patients daughter, Bryson Ha.  Daughter states patient is now taking 3 Oxycodone every 6 hours, pain more severe on dialysis days d/t moving.  Daughter is also requesting hoyer lift. Olin Hauser to reach out to orthopedic surgeon in PCP absence for pain control and hoyer lift.  Discussed consideration of rehab facility, daughter states patient refuses.  Bryson Ha plans to call ortho today.

## 2018-10-25 DIAGNOSIS — E8770 Fluid overload, unspecified: Secondary | ICD-10-CM | POA: Diagnosis not present

## 2018-10-25 DIAGNOSIS — D689 Coagulation defect, unspecified: Secondary | ICD-10-CM | POA: Diagnosis not present

## 2018-10-25 DIAGNOSIS — E876 Hypokalemia: Secondary | ICD-10-CM | POA: Diagnosis not present

## 2018-10-25 DIAGNOSIS — N186 End stage renal disease: Secondary | ICD-10-CM | POA: Diagnosis not present

## 2018-10-25 DIAGNOSIS — D631 Anemia in chronic kidney disease: Secondary | ICD-10-CM | POA: Diagnosis not present

## 2018-10-25 DIAGNOSIS — N2581 Secondary hyperparathyroidism of renal origin: Secondary | ICD-10-CM | POA: Diagnosis not present

## 2018-10-26 DIAGNOSIS — I130 Hypertensive heart and chronic kidney disease with heart failure and stage 1 through stage 4 chronic kidney disease, or unspecified chronic kidney disease: Secondary | ICD-10-CM | POA: Diagnosis not present

## 2018-10-26 DIAGNOSIS — S3210XD Unspecified fracture of sacrum, subsequent encounter for fracture with routine healing: Secondary | ICD-10-CM | POA: Diagnosis not present

## 2018-10-26 DIAGNOSIS — S72002D Fracture of unspecified part of neck of left femur, subsequent encounter for closed fracture with routine healing: Secondary | ICD-10-CM | POA: Diagnosis not present

## 2018-10-26 DIAGNOSIS — S32509D Unspecified fracture of unspecified pubis, subsequent encounter for fracture with routine healing: Secondary | ICD-10-CM | POA: Diagnosis not present

## 2018-10-26 DIAGNOSIS — I5042 Chronic combined systolic (congestive) and diastolic (congestive) heart failure: Secondary | ICD-10-CM | POA: Diagnosis not present

## 2018-10-27 DIAGNOSIS — D631 Anemia in chronic kidney disease: Secondary | ICD-10-CM | POA: Diagnosis not present

## 2018-10-27 DIAGNOSIS — E8770 Fluid overload, unspecified: Secondary | ICD-10-CM | POA: Diagnosis not present

## 2018-10-27 DIAGNOSIS — N186 End stage renal disease: Secondary | ICD-10-CM | POA: Diagnosis not present

## 2018-10-27 DIAGNOSIS — E876 Hypokalemia: Secondary | ICD-10-CM | POA: Diagnosis not present

## 2018-10-27 DIAGNOSIS — N2581 Secondary hyperparathyroidism of renal origin: Secondary | ICD-10-CM | POA: Diagnosis not present

## 2018-10-27 DIAGNOSIS — D689 Coagulation defect, unspecified: Secondary | ICD-10-CM | POA: Diagnosis not present

## 2018-10-28 DIAGNOSIS — S72002D Fracture of unspecified part of neck of left femur, subsequent encounter for closed fracture with routine healing: Secondary | ICD-10-CM | POA: Diagnosis not present

## 2018-10-28 DIAGNOSIS — S3210XD Unspecified fracture of sacrum, subsequent encounter for fracture with routine healing: Secondary | ICD-10-CM | POA: Diagnosis not present

## 2018-10-28 DIAGNOSIS — I5042 Chronic combined systolic (congestive) and diastolic (congestive) heart failure: Secondary | ICD-10-CM | POA: Diagnosis not present

## 2018-10-28 DIAGNOSIS — S32509D Unspecified fracture of unspecified pubis, subsequent encounter for fracture with routine healing: Secondary | ICD-10-CM | POA: Diagnosis not present

## 2018-10-28 DIAGNOSIS — I130 Hypertensive heart and chronic kidney disease with heart failure and stage 1 through stage 4 chronic kidney disease, or unspecified chronic kidney disease: Secondary | ICD-10-CM | POA: Diagnosis not present

## 2018-10-29 DIAGNOSIS — N186 End stage renal disease: Secondary | ICD-10-CM | POA: Diagnosis not present

## 2018-10-29 DIAGNOSIS — E8770 Fluid overload, unspecified: Secondary | ICD-10-CM | POA: Diagnosis not present

## 2018-10-29 DIAGNOSIS — D689 Coagulation defect, unspecified: Secondary | ICD-10-CM | POA: Diagnosis not present

## 2018-10-29 DIAGNOSIS — E876 Hypokalemia: Secondary | ICD-10-CM | POA: Diagnosis not present

## 2018-10-29 DIAGNOSIS — D631 Anemia in chronic kidney disease: Secondary | ICD-10-CM | POA: Diagnosis not present

## 2018-10-29 DIAGNOSIS — N2581 Secondary hyperparathyroidism of renal origin: Secondary | ICD-10-CM | POA: Diagnosis not present

## 2018-10-30 ENCOUNTER — Other Ambulatory Visit: Payer: Self-pay | Admitting: Family Medicine

## 2018-10-31 DIAGNOSIS — S72002D Fracture of unspecified part of neck of left femur, subsequent encounter for closed fracture with routine healing: Secondary | ICD-10-CM | POA: Diagnosis not present

## 2018-10-31 DIAGNOSIS — I5042 Chronic combined systolic (congestive) and diastolic (congestive) heart failure: Secondary | ICD-10-CM | POA: Diagnosis not present

## 2018-10-31 DIAGNOSIS — I130 Hypertensive heart and chronic kidney disease with heart failure and stage 1 through stage 4 chronic kidney disease, or unspecified chronic kidney disease: Secondary | ICD-10-CM | POA: Diagnosis not present

## 2018-10-31 DIAGNOSIS — S3210XD Unspecified fracture of sacrum, subsequent encounter for fracture with routine healing: Secondary | ICD-10-CM | POA: Diagnosis not present

## 2018-10-31 DIAGNOSIS — S32509D Unspecified fracture of unspecified pubis, subsequent encounter for fracture with routine healing: Secondary | ICD-10-CM | POA: Diagnosis not present

## 2018-10-31 NOTE — Telephone Encounter (Signed)
Kim, pls get hoyer lift order started.Thx!!!

## 2018-10-31 NOTE — Telephone Encounter (Signed)
LM requesting call back to discuss hoyer lift. Daughter was planning to contact Ortho for order.

## 2018-11-01 ENCOUNTER — Telehealth: Payer: Self-pay

## 2018-11-01 DIAGNOSIS — N186 End stage renal disease: Secondary | ICD-10-CM | POA: Diagnosis not present

## 2018-11-01 DIAGNOSIS — D631 Anemia in chronic kidney disease: Secondary | ICD-10-CM | POA: Diagnosis not present

## 2018-11-01 DIAGNOSIS — N2581 Secondary hyperparathyroidism of renal origin: Secondary | ICD-10-CM | POA: Diagnosis not present

## 2018-11-01 DIAGNOSIS — D689 Coagulation defect, unspecified: Secondary | ICD-10-CM | POA: Diagnosis not present

## 2018-11-01 DIAGNOSIS — E8770 Fluid overload, unspecified: Secondary | ICD-10-CM | POA: Diagnosis not present

## 2018-11-01 DIAGNOSIS — E876 Hypokalemia: Secondary | ICD-10-CM | POA: Diagnosis not present

## 2018-11-01 NOTE — Telephone Encounter (Signed)
Received fax for multiple orders for pt. Given to PCP to review and sign.

## 2018-11-01 NOTE — Telephone Encounter (Signed)
I'm confused. Do they want a hoyer lift or not? If so, is the process started to get her one.?

## 2018-11-01 NOTE — Telephone Encounter (Signed)
signed

## 2018-11-01 NOTE — Telephone Encounter (Signed)
Bryson Ha (pts daughter) left message on VM stating they do not have a hoyer lift. She does not feel the need for one now, patient is able to assist some with transfers.  She reports patient tires easily, no needs at this time. Will continue to update with changes/needs.

## 2018-11-02 ENCOUNTER — Encounter: Payer: Self-pay | Admitting: Family Medicine

## 2018-11-02 DIAGNOSIS — S3210XD Unspecified fracture of sacrum, subsequent encounter for fracture with routine healing: Secondary | ICD-10-CM | POA: Diagnosis not present

## 2018-11-02 DIAGNOSIS — I130 Hypertensive heart and chronic kidney disease with heart failure and stage 1 through stage 4 chronic kidney disease, or unspecified chronic kidney disease: Secondary | ICD-10-CM | POA: Diagnosis not present

## 2018-11-02 DIAGNOSIS — I5042 Chronic combined systolic (congestive) and diastolic (congestive) heart failure: Secondary | ICD-10-CM | POA: Diagnosis not present

## 2018-11-02 DIAGNOSIS — S72002D Fracture of unspecified part of neck of left femur, subsequent encounter for closed fracture with routine healing: Secondary | ICD-10-CM | POA: Diagnosis not present

## 2018-11-02 DIAGNOSIS — S32509D Unspecified fracture of unspecified pubis, subsequent encounter for fracture with routine healing: Secondary | ICD-10-CM | POA: Diagnosis not present

## 2018-11-02 NOTE — Telephone Encounter (Signed)
Daughter does not want hoyer lift at present time.

## 2018-11-02 NOTE — Telephone Encounter (Addendum)
SW with daughter, [name].  Daughter reports patient has increased weakness, increasing sleeping, incoherent at times, restless and not know where she is at times. Patient attempted to remove dialysis needles out yesterday during treatment. Patient having bowel issues, but is having small stools. Daughter is requesting any suggestions to assist care for patient. [facility] RN to see pt today at 2p. Advised daughter to have [facility] nurse call while at home after her assessment.   3:10pm--SW with Ronette Deter RN [phone]) regarding patients condition. RN states patient is not progressing with healing or with PT. She spoke with patient and daughter regarding re-admission to a different rehab facility, both agree with plan. Rosalee Kaufman will send Social Worker to home to start process.

## 2018-11-03 DIAGNOSIS — E876 Hypokalemia: Secondary | ICD-10-CM | POA: Diagnosis not present

## 2018-11-03 DIAGNOSIS — N2581 Secondary hyperparathyroidism of renal origin: Secondary | ICD-10-CM | POA: Diagnosis not present

## 2018-11-03 DIAGNOSIS — E8770 Fluid overload, unspecified: Secondary | ICD-10-CM | POA: Diagnosis not present

## 2018-11-03 DIAGNOSIS — N186 End stage renal disease: Secondary | ICD-10-CM | POA: Diagnosis not present

## 2018-11-03 DIAGNOSIS — D631 Anemia in chronic kidney disease: Secondary | ICD-10-CM | POA: Diagnosis not present

## 2018-11-03 DIAGNOSIS — D689 Coagulation defect, unspecified: Secondary | ICD-10-CM | POA: Diagnosis not present

## 2018-11-03 NOTE — Telephone Encounter (Signed)
Noted. Pt declining. Agree with return to a rehab facility. Also needs another [facility] nurse visit to discuss hospice.  I think you already have this taken care of.

## 2018-11-03 NOTE — Telephone Encounter (Signed)
Hoyer lift is fine.

## 2018-11-04 DIAGNOSIS — S3210XD Unspecified fracture of sacrum, subsequent encounter for fracture with routine healing: Secondary | ICD-10-CM | POA: Diagnosis not present

## 2018-11-04 DIAGNOSIS — S72002D Fracture of unspecified part of neck of left femur, subsequent encounter for closed fracture with routine healing: Secondary | ICD-10-CM | POA: Diagnosis not present

## 2018-11-04 DIAGNOSIS — S32509D Unspecified fracture of unspecified pubis, subsequent encounter for fracture with routine healing: Secondary | ICD-10-CM | POA: Diagnosis not present

## 2018-11-04 DIAGNOSIS — I130 Hypertensive heart and chronic kidney disease with heart failure and stage 1 through stage 4 chronic kidney disease, or unspecified chronic kidney disease: Secondary | ICD-10-CM | POA: Diagnosis not present

## 2018-11-04 DIAGNOSIS — I5042 Chronic combined systolic (congestive) and diastolic (congestive) heart failure: Secondary | ICD-10-CM | POA: Diagnosis not present

## 2018-11-05 DIAGNOSIS — N2581 Secondary hyperparathyroidism of renal origin: Secondary | ICD-10-CM | POA: Diagnosis not present

## 2018-11-05 DIAGNOSIS — E876 Hypokalemia: Secondary | ICD-10-CM | POA: Diagnosis not present

## 2018-11-05 DIAGNOSIS — D689 Coagulation defect, unspecified: Secondary | ICD-10-CM | POA: Diagnosis not present

## 2018-11-05 DIAGNOSIS — N186 End stage renal disease: Secondary | ICD-10-CM | POA: Diagnosis not present

## 2018-11-05 DIAGNOSIS — D631 Anemia in chronic kidney disease: Secondary | ICD-10-CM | POA: Diagnosis not present

## 2018-11-05 DIAGNOSIS — E8770 Fluid overload, unspecified: Secondary | ICD-10-CM | POA: Diagnosis not present

## 2018-11-07 ENCOUNTER — Telehealth: Payer: Self-pay

## 2018-11-07 NOTE — Telephone Encounter (Signed)
Spoke with Debbie regarding FL2. She is in the process of finding facility that will accept patient. Jackelyn Poling is faxing FL2 form to PCP for completion.

## 2018-11-07 NOTE — Telephone Encounter (Signed)
Copied from [place] [phone]. Topic: General - Inquiry >> Nov 07, 2018  4:19 PM Berneta Levins wrote: Reason for CRM:   Jackelyn Poling, social worker with [facility] calling.  She needs to speak with someone about getting pt a FL2 filled out for skilled rehab placement. Jackelyn Poling can be reached at [phone].

## 2018-11-08 DIAGNOSIS — E876 Hypokalemia: Secondary | ICD-10-CM | POA: Diagnosis not present

## 2018-11-08 DIAGNOSIS — N186 End stage renal disease: Secondary | ICD-10-CM | POA: Diagnosis not present

## 2018-11-08 DIAGNOSIS — D631 Anemia in chronic kidney disease: Secondary | ICD-10-CM | POA: Diagnosis not present

## 2018-11-08 DIAGNOSIS — N2581 Secondary hyperparathyroidism of renal origin: Secondary | ICD-10-CM | POA: Diagnosis not present

## 2018-11-08 DIAGNOSIS — D689 Coagulation defect, unspecified: Secondary | ICD-10-CM | POA: Diagnosis not present

## 2018-11-08 DIAGNOSIS — E8770 Fluid overload, unspecified: Secondary | ICD-10-CM | POA: Diagnosis not present

## 2018-11-08 NOTE — Telephone Encounter (Signed)
Completed FL2 faxed to Claire Shown, Education officer, museum at [employer].  Jackelyn Poling continues to actively seek facilities willing to accept patient from community rather than inpatient.

## 2018-11-09 DIAGNOSIS — S3210XD Unspecified fracture of sacrum, subsequent encounter for fracture with routine healing: Secondary | ICD-10-CM | POA: Diagnosis not present

## 2018-11-09 DIAGNOSIS — I130 Hypertensive heart and chronic kidney disease with heart failure and stage 1 through stage 4 chronic kidney disease, or unspecified chronic kidney disease: Secondary | ICD-10-CM | POA: Diagnosis not present

## 2018-11-09 DIAGNOSIS — S32509D Unspecified fracture of unspecified pubis, subsequent encounter for fracture with routine healing: Secondary | ICD-10-CM | POA: Diagnosis not present

## 2018-11-09 DIAGNOSIS — I5042 Chronic combined systolic (congestive) and diastolic (congestive) heart failure: Secondary | ICD-10-CM | POA: Diagnosis not present

## 2018-11-09 DIAGNOSIS — S72002D Fracture of unspecified part of neck of left femur, subsequent encounter for closed fracture with routine healing: Secondary | ICD-10-CM | POA: Diagnosis not present

## 2018-11-10 DIAGNOSIS — D689 Coagulation defect, unspecified: Secondary | ICD-10-CM | POA: Diagnosis not present

## 2018-11-10 DIAGNOSIS — E8770 Fluid overload, unspecified: Secondary | ICD-10-CM | POA: Diagnosis not present

## 2018-11-10 DIAGNOSIS — E876 Hypokalemia: Secondary | ICD-10-CM | POA: Diagnosis not present

## 2018-11-10 DIAGNOSIS — D631 Anemia in chronic kidney disease: Secondary | ICD-10-CM | POA: Diagnosis not present

## 2018-11-10 DIAGNOSIS — N2581 Secondary hyperparathyroidism of renal origin: Secondary | ICD-10-CM | POA: Diagnosis not present

## 2018-11-10 DIAGNOSIS — N186 End stage renal disease: Secondary | ICD-10-CM | POA: Diagnosis not present

## 2018-11-11 DIAGNOSIS — N186 End stage renal disease: Secondary | ICD-10-CM | POA: Diagnosis not present

## 2018-11-11 DIAGNOSIS — D631 Anemia in chronic kidney disease: Secondary | ICD-10-CM | POA: Diagnosis not present

## 2018-11-11 DIAGNOSIS — E876 Hypokalemia: Secondary | ICD-10-CM | POA: Diagnosis not present

## 2018-11-11 DIAGNOSIS — E8770 Fluid overload, unspecified: Secondary | ICD-10-CM | POA: Diagnosis not present

## 2018-11-11 DIAGNOSIS — D689 Coagulation defect, unspecified: Secondary | ICD-10-CM | POA: Diagnosis not present

## 2018-11-11 DIAGNOSIS — N2581 Secondary hyperparathyroidism of renal origin: Secondary | ICD-10-CM | POA: Diagnosis not present

## 2018-11-12 DIAGNOSIS — D689 Coagulation defect, unspecified: Secondary | ICD-10-CM | POA: Diagnosis not present

## 2018-11-12 DIAGNOSIS — E8770 Fluid overload, unspecified: Secondary | ICD-10-CM | POA: Diagnosis not present

## 2018-11-12 DIAGNOSIS — E876 Hypokalemia: Secondary | ICD-10-CM | POA: Diagnosis not present

## 2018-11-12 DIAGNOSIS — D631 Anemia in chronic kidney disease: Secondary | ICD-10-CM | POA: Diagnosis not present

## 2018-11-12 DIAGNOSIS — N2581 Secondary hyperparathyroidism of renal origin: Secondary | ICD-10-CM | POA: Diagnosis not present

## 2018-11-12 DIAGNOSIS — N186 End stage renal disease: Secondary | ICD-10-CM | POA: Diagnosis not present

## 2018-11-15 DIAGNOSIS — D631 Anemia in chronic kidney disease: Secondary | ICD-10-CM | POA: Diagnosis not present

## 2018-11-15 DIAGNOSIS — N186 End stage renal disease: Secondary | ICD-10-CM | POA: Diagnosis not present

## 2018-11-15 DIAGNOSIS — E8770 Fluid overload, unspecified: Secondary | ICD-10-CM | POA: Diagnosis not present

## 2018-11-15 DIAGNOSIS — N2581 Secondary hyperparathyroidism of renal origin: Secondary | ICD-10-CM | POA: Diagnosis not present

## 2018-11-15 DIAGNOSIS — E876 Hypokalemia: Secondary | ICD-10-CM | POA: Diagnosis not present

## 2018-11-15 DIAGNOSIS — D689 Coagulation defect, unspecified: Secondary | ICD-10-CM | POA: Diagnosis not present

## 2018-11-15 DIAGNOSIS — Z992 Dependence on renal dialysis: Secondary | ICD-10-CM | POA: Diagnosis not present

## 2018-11-15 DIAGNOSIS — I120 Hypertensive chronic kidney disease with stage 5 chronic kidney disease or end stage renal disease: Secondary | ICD-10-CM | POA: Diagnosis not present

## 2018-11-16 ENCOUNTER — Ambulatory Visit (INDEPENDENT_AMBULATORY_CARE_PROVIDER_SITE_OTHER): Payer: Medicare Other | Admitting: Family Medicine

## 2018-11-16 ENCOUNTER — Encounter: Payer: Self-pay | Admitting: Family Medicine

## 2018-11-16 ENCOUNTER — Other Ambulatory Visit: Payer: Self-pay

## 2018-11-16 VITALS — BP 145/63 | HR 74

## 2018-11-16 DIAGNOSIS — N189 Chronic kidney disease, unspecified: Secondary | ICD-10-CM | POA: Diagnosis not present

## 2018-11-16 DIAGNOSIS — Z4789 Encounter for other orthopedic aftercare: Secondary | ICD-10-CM | POA: Diagnosis not present

## 2018-11-16 DIAGNOSIS — D631 Anemia in chronic kidney disease: Secondary | ICD-10-CM | POA: Diagnosis not present

## 2018-11-16 DIAGNOSIS — D649 Anemia, unspecified: Secondary | ICD-10-CM | POA: Diagnosis not present

## 2018-11-16 DIAGNOSIS — S72001D Fracture of unspecified part of neck of right femur, subsequent encounter for closed fracture with routine healing: Secondary | ICD-10-CM

## 2018-11-16 DIAGNOSIS — R5381 Other malaise: Secondary | ICD-10-CM | POA: Diagnosis not present

## 2018-11-16 MED ORDER — HYDROCODONE-ACETAMINOPHEN 5-325 MG PO TABS: ORAL_TABLET | ORAL | 0 refills | Status: DC

## 2018-11-16 NOTE — Progress Notes (Signed)
Virtual Visit via Video Note  I connected with pt on 11/16/18 at  2:30 PM EDT by a video enabled telemedicine application and verified that I am speaking with the correct person using two identifiers.  Location patient: home Location provider:work or home office Persons participating in the virtual visit: patient, her daughter Valerie Terrell, and myself.    I discussed the limitations of evaluation and management by telemedicine and the availability of in person appointments. The patient expressed understanding and agreed to proceed.  Telemedicine visit is a necessity given the COVID-19 restrictions in place at the current time.  HPI: 85 y/o WF being seen today for 1 mo f/u right hip pain and disability/debilitation surrounding her recent hip fracture and subsequent surgery.  Pain control and getting appropriate DME (incuding oxygen) have been a big process. PT was started at the time of last visit and her pain control was adequate on oxycodone. I did not get a post-op CBC at that visit b/c I thought her dialysis clinic may do this but it appears they have not.  F/u with ortho today was good, x-rays stable. Pain control is better.  Takes 1/2-1 oxycodone per day only.  Otherwise using tylenol. Her daughter Valerie Terrell says pt is in more pain than she admits.  Pt fearful of the constipation, fatigue, and mental fogginess/sleepines she may get if she takes doses that are adequate to control her pain. Physical therapy: helping a lot "when he shows up". Dialysis: has good days and bad days.   She is using dulcolax prn for constipation: usually 1 tab per day, occ 2 tabs. Appetite comes and goes.  She feels like she has everything she needs at home regarding [facility], PT, and DME.   ROS: no CP, no SOB, no wheezing, no cough, no dizziness, no HAs, no rashes, no melena/hematochezia.  No polyuria or polydipsia.     Past Medical History:  Diagnosis Date  . Anemia of chronic disease 2018   + anemia of  CRI?  [name] Atrophy of left kidney    with absent blood flow by renal artery dopplers (Dr. Gwenlyn Found)  . 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 Chronic combined systolic and diastolic CHF (congestive heart failure) ([place])    a. 11/2016: echo showing EF of 35-40%, RV strain noted, mild MR and mild TR.   Echo showed much improved EF 12/2017 (55-60%)  . Chronic respiratory failure with hypoxia ([place]) 02/11/2017   Now on chronic O2 (intermittent as of summer 2019).    . Closed right hip fracture ([place]) 10/04/2018   ORIF  . Debilitated patient    WC dependent as of 2018.  Hosp bed + full assistance with most ADLs s/p hip fx surg 09/2018.  [name] ESRD on hemodialysis ([place]) 01/2017   T/Th/Sat schedule- [facility].  Right basilic AV fistula 19/3790.  Graft 2019.  [name] History of adenomatous polyp of colon   . History of kidney stones   . History of subarachnoid hemorrhage 10/2014   after syncope and while on xarelto  . Hyperlipidemia   . Hypertension    Difficult to control, in the setting of one functioning kidney: pt was referred to nephrology by Dr. Gwenlyn Found 06/2015.  . Lumbar radiculopathy 2012  . Lumbar spinal stenosis 2019   facet injections only very short term relief.  L1 and L2 selective nerve root blocks helpful 04/2018.  . Lumbar spondylosis    MR 07/2016---no sign of spinal nerve compression or cord compression.  Pt set up with outpt ortho while admitted to hosp 07/2016.  . Malnourished ([place])   . Metatarsal fracture 06/10/2016   Nondisplaced, left 5th metatarsal--pt was referred to ortho  . Osteopenia 2014   T-score -2.1  . PAF (paroxysmal atrial fibrillation) (HCC)    Eliquis started after BRAO and CVA.  She was changed to warfarin 2018. All anticoag stopped due to recurrent falls.  . Presence of permanent cardiac pacemaker   . Right rib fracture 12/2016   s/p fall  . Sick sinus syndrome ([place]) 11/2016   Dual chamber pacer insertion 2018 (Dr. Lovena Le)  . Stroke ([place]) 91/6384    cardioembolic (had CVA while on no anticoag)--"scattered subacute punctate infarcts: 1 in R parietal lobe and 2 in occipital cortex" on MRI br.  CT angio head/neck: aortic arch athero.  R ICA 20% stenosis, L ICA w/out any stenosis.  . Thoracic back pain 01/2017; 01/2018   2018: Facet?  Dr. Ramos->steroid injection.  01/2018-->CT T spine to check for a new comp fx-->none found.  Selective nerve root inj in T spine helpful on R side.   . Thoracic compression fracture ([place]) 07/2016   T3.  T7 and T8-- T8 kyphoplasty during hosp admission 07/2016.  Neuro referred pt to pain mgmt for consideration of injection 09/2016.  I referred her to endo 08/2016 for consideration of calcitonin treatment.    Past Surgical History:  Procedure Laterality Date  . APPENDECTOMY  child  . AV FISTULA PLACEMENT Right 03/31/2017   Procedure: Right ARM Basilic vein transposition;  Surgeon: Conrad Zumbrota, MD;  Location: [facility] OR;  Service: Vascular;  Laterality: Right;  . AV FISTULA PLACEMENT Right 12/06/2017   Procedure: INSERTION OF ARTERIOVENOUS (AV) GORE-TEX GRAFT ARM RIGHT UPPER EXTREMITY;  Surgeon: [name], MD;  Location: [facility];  Service: Vascular;  Laterality: Right;  . CARDIOVASCULAR STRESS TEST  01/31/2016   Stress myoview: NORMAL/Low risk.  EF 56%.  . [place]  . COLONOSCOPY  2015   + hx of adenomatous polyps.  Need digest health spec in [facility] records to see when pt due for next colonoscopy  . EYE SURGERY Bilateral    Catarct  . INSERTION OF DIALYSIS CATHETER Right 01/29/2017   Procedure: INSERTION OF DIALYSIS CATHETER- RIGHT INTERNAL JUGULAR;  Surgeon: [name], MD;  Location: [facility];  Service: Vascular;  Laterality: Right;  . INTRAMEDULLARY (IM) NAIL INTERTROCHANTERIC Right 10/04/2018   Procedure: INTRAMEDULLARY (IM) NAIL INTERTROCHANTRIC;  Surgeon: Nicholes Stairs, MD;  Location: [facility];  Service: Orthopedics;  Laterality: Right;  . IR GENERIC HISTORICAL  07/24/2016   IR KYPHO THORACIC  WITH BONE BIOPSY 07/24/2016 Luanne Bras, MD [facility]INTER[facility] RAD  . KYPHOPLASTY  07/27/2016   T8  . PACEMAKER IMPLANT N/A 12/03/2016   Procedure: Pacemaker Implant;  Surgeon: Evans Lance, MD;  Location: [facility];  Service: Cardiovascular;  Laterality: N/A;  . Renal artery dopplers  02/26/2016   Her right renal dimension was 11 cm pole to pole with mild to moderate right renal artery stenosis. A right renal aortic ratio was 3.22 suggesting less than a 50% stenosis.  . Right upper arm AV Gore-Tex graft  12/06/2017   Dr. Donnetta Hutching  . TONSILLECTOMY    . TRANSTHORACIC ECHOCARDIOGRAM  01/29/2016; 11/2016; 12/2017   2017: EF 55-60%, normal LV wall motion, grade I DD.  No cardiac source of emboli was seen.  2018: EF 35-40%, could not assess DD due to a-fib, pulm HTN  noted. 12/2017 EF 55-60%, nl wall motion, grd II DD, mod MR and pulm regurg, increased pulm pressure (peak 52).    Family History  Problem Relation Age of Onset  . Cancer Mother   . Heart disease Father   . Early death Father   . Sudden Cardiac Death Neg Hx     SOCIAL HX: Widow, lives with daughter. Retired Marine scientist.  No T/A.   Current Outpatient Medications:  .  acetaminophen (TYLENOL) 500 MG tablet, Take 500-1,000 mg 3 (three) times daily as needed by mouth for moderate pain. , Disp: , Rfl:  .  amiodarone (PACERONE) 200 MG tablet, TAKE 1 TABLET BY MOUTH  DAILY (Patient taking differently: Take 200 mg by mouth daily. ), Disp: 90 tablet, Rfl: 2 .  aspirin 325 MG tablet, Take 1 tablet (325 mg total) by mouth daily., Disp: 42 tablet, Rfl: 0 .  b complex-vitamin c-folic acid (NEPHRO-VITE) 0.8 MG TABS tablet, Take 1 tablet by mouth daily., Disp: , Rfl:  .  carvedilol (COREG) 3.125 MG tablet, TAKE 1 TABLET BY MOUTH TWO  TIMES DAILY (Patient taking differently: Take 3.125 mg by mouth 2 (two) times daily with a meal. ), Disp: 180 tablet, Rfl: 1 .  docusate sodium (COLACE) 100 MG capsule, Take 100 mg by mouth daily., Disp: , Rfl:  .   isosorbide mononitrate (IMDUR) 60 MG 24 hr tablet, TAKE 1 TABLET BY MOUTH  DAILY, Disp: 90 tablet, Rfl: 0 .  Multiple Vitamins-Minerals (MULTIVITAMIN WITH MINERALS) tablet, Take 1 tablet by mouth daily., Disp: , Rfl:  .  ondansetron (ZOFRAN) 4 MG tablet, Take 1 tablet (4 mg total) by mouth every 8 (eight) hours as needed for nausea or vomiting., Disp: 60 tablet, Rfl: 1 .  oxyCODONE (OXY IR/ROXICODONE) 5 MG immediate release tablet, 1-2 tabs po q6h prn pain, Disp: 180 tablet, Rfl: 0 .  polyvinyl alcohol (LIQUIFILM TEARS) 1.4 % ophthalmic solution, Place 1-2 drops into both eyes as needed for dry eyes., Disp: , Rfl:  .  pravastatin (PRAVACHOL) 40 MG tablet, TAKE 1 TABLET BY MOUTH  EVERY EVENING (Patient taking differently: Take 40 mg by mouth daily. ), Disp: 90 tablet, Rfl: 1 .  sevelamer carbonate (RENVELA) 800 MG tablet, Take 1,600-2,400 mg by mouth See admin instructions. 2400 mg with meals and 1600 with snack, Disp: , Rfl:  .  vitamin C (ASCORBIC ACID) 500 MG tablet, Take 500 mg by mouth daily., Disp: , Rfl:  .  Vitamin D, Ergocalciferol, (DRISDOL) 1.25 MG (50000 UT) CAPS capsule, Take 1 capsule (50,000 Units total) by mouth every Thursday., Disp: 12 capsule, Rfl: 3  EXAM:  VITALS per patient if applicable: BP (!) 784/69 (BP Location: Left Arm, Patient Position: Sitting, Cuff Size: Normal)   Pulse 74   LMP  (LMP Unknown)   SpO2 93% Comment: room air   GENERAL: alert, oriented, appears well and in no acute distress  HEENT: atraumatic, conjunttiva clear, no obvious abnormalities on inspection of external nose and ears  NECK: normal movements of the head and neck  LUNGS: on inspection no signs of respiratory distress, breathing rate appears normal, no obvious gross SOB, gasping or wheezing  CV: no obvious cyanosis  MS: moves all visible extremities without noticeable abnormality  PSYCH/NEURO: pleasant and cooperative, no obvious depression or anxiety, speech and thought processing  grossly intact  LABS: none today  Lab Results  Component Value Date   HGBA1C 5.2 12/15/2016   Lab Results  Component Value Date   WBC  12.8 (H) 10/15/2018   HGB 8.5 (L) 10/15/2018   HCT 25.0 (L) 10/15/2018   MCV 100.4 (H) 10/15/2018   PLT 349 10/15/2018     Chemistry      Component Value Date/Time   NA 131 (L) 10/15/2018 0816   NA 138 06/23/2018   K 3.7 10/15/2018 0816   CL 90 (L) 10/15/2018 0759   CO2 25 10/15/2018 0759   BUN 26 (H) 10/15/2018 0759   BUN 30 (A) 06/23/2018   CREATININE 3.81 (H) 10/15/2018 0759   CREATININE 1.63 (H) 03/24/2016 0110   GLU 128 09/01/2016      Component Value Date/Time   CALCIUM 9.0 10/15/2018 0759   CALCIUM 9.5 02/16/2017 0232   ALKPHOS 59 07/18/2018 2236   AST 17 07/18/2018 2236   ALT 9 07/18/2018 2236   BILITOT 0.5 07/18/2018 2236   BILITOT 0.2 06/17/2016 0819     Lab Results  Component Value Date   PTH 131 (H) 02/16/2017   PTH Comment 02/16/2017   CALCIUM 9.0 10/15/2018   CAION 1.19 10/15/2018   PHOS 2.5 10/10/2018    ASSESSMENT AND PLAN:  Discussed the following assessment and plan:  1) Close fx right hip, s/p ORIF, improving gradually with PT. Pain control not ideal. We'll try to go to something less potent: vicodin 5/325, 1-2 tid prn, #120.  Save oxycodone for SEVERE pain.  Stop tylenol as long as she is taking vicodin. Hopefully this will be a good middle ground right now to help with pain adequately but not cause too much side effect problem. She is debilitated and needs all the DME she has (hosp bed, etc).  I discussed the assessment and treatment plan with the patient. The patient was provided an opportunity to ask questions and all were answered. The patient agreed with the plan and demonstrated an understanding of the instructions.   The patient was advised to call back or seek an in-person evaluation if the symptoms worsen or if the condition fails to improve as anticipated.  Spent 25 min with pt today, with  >50% of this time spent in counseling and care coordination regarding the above problems.  F/u: 11mo  Signed:  Crissie Sickles, MD           11/16/2018

## 2018-11-17 ENCOUNTER — Ambulatory Visit: Payer: Medicare Other | Admitting: Family Medicine

## 2018-11-17 DIAGNOSIS — N186 End stage renal disease: Secondary | ICD-10-CM | POA: Diagnosis not present

## 2018-11-17 DIAGNOSIS — D631 Anemia in chronic kidney disease: Secondary | ICD-10-CM | POA: Diagnosis not present

## 2018-11-17 DIAGNOSIS — D689 Coagulation defect, unspecified: Secondary | ICD-10-CM | POA: Diagnosis not present

## 2018-11-17 DIAGNOSIS — I5042 Chronic combined systolic (congestive) and diastolic (congestive) heart failure: Secondary | ICD-10-CM | POA: Diagnosis not present

## 2018-11-17 DIAGNOSIS — G894 Chronic pain syndrome: Secondary | ICD-10-CM | POA: Diagnosis not present

## 2018-11-17 DIAGNOSIS — N2581 Secondary hyperparathyroidism of renal origin: Secondary | ICD-10-CM | POA: Diagnosis not present

## 2018-11-17 DIAGNOSIS — E876 Hypokalemia: Secondary | ICD-10-CM | POA: Diagnosis not present

## 2018-11-18 ENCOUNTER — Telehealth: Payer: Self-pay | Admitting: Family Medicine

## 2018-11-18 DIAGNOSIS — S3210XD Unspecified fracture of sacrum, subsequent encounter for fracture with routine healing: Secondary | ICD-10-CM | POA: Diagnosis not present

## 2018-11-18 DIAGNOSIS — S72002D Fracture of unspecified part of neck of left femur, subsequent encounter for closed fracture with routine healing: Secondary | ICD-10-CM | POA: Diagnosis not present

## 2018-11-18 DIAGNOSIS — S32509D Unspecified fracture of unspecified pubis, subsequent encounter for fracture with routine healing: Secondary | ICD-10-CM | POA: Diagnosis not present

## 2018-11-18 DIAGNOSIS — I130 Hypertensive heart and chronic kidney disease with heart failure and stage 1 through stage 4 chronic kidney disease, or unspecified chronic kidney disease: Secondary | ICD-10-CM | POA: Diagnosis not present

## 2018-11-18 DIAGNOSIS — I5042 Chronic combined systolic (congestive) and diastolic (congestive) heart failure: Secondary | ICD-10-CM | POA: Diagnosis not present

## 2018-11-18 NOTE — Telephone Encounter (Signed)
Claire Shown Social Worker calling from [facility] calling to report missed visit. The daughter requested the visit the visit to be made at a later date. Thank you. Cb- [phone]

## 2018-11-19 DIAGNOSIS — E876 Hypokalemia: Secondary | ICD-10-CM | POA: Diagnosis not present

## 2018-11-19 DIAGNOSIS — N186 End stage renal disease: Secondary | ICD-10-CM | POA: Diagnosis not present

## 2018-11-19 DIAGNOSIS — N2581 Secondary hyperparathyroidism of renal origin: Secondary | ICD-10-CM | POA: Diagnosis not present

## 2018-11-19 DIAGNOSIS — D631 Anemia in chronic kidney disease: Secondary | ICD-10-CM | POA: Diagnosis not present

## 2018-11-19 DIAGNOSIS — D689 Coagulation defect, unspecified: Secondary | ICD-10-CM | POA: Diagnosis not present

## 2018-11-20 DIAGNOSIS — J9621 Acute and chronic respiratory failure with hypoxia: Secondary | ICD-10-CM | POA: Diagnosis not present

## 2018-11-20 DIAGNOSIS — R5381 Other malaise: Secondary | ICD-10-CM | POA: Diagnosis not present

## 2018-11-20 DIAGNOSIS — Z789 Other specified health status: Secondary | ICD-10-CM | POA: Diagnosis not present

## 2018-11-20 DIAGNOSIS — J9601 Acute respiratory failure with hypoxia: Secondary | ICD-10-CM | POA: Diagnosis not present

## 2018-11-21 ENCOUNTER — Ambulatory Visit: Payer: Medicare Other | Admitting: Family Medicine

## 2018-11-21 NOTE — Telephone Encounter (Signed)
Noted  

## 2018-11-22 DIAGNOSIS — D631 Anemia in chronic kidney disease: Secondary | ICD-10-CM | POA: Diagnosis not present

## 2018-11-22 DIAGNOSIS — N186 End stage renal disease: Secondary | ICD-10-CM | POA: Diagnosis not present

## 2018-11-22 DIAGNOSIS — D689 Coagulation defect, unspecified: Secondary | ICD-10-CM | POA: Diagnosis not present

## 2018-11-22 DIAGNOSIS — N2581 Secondary hyperparathyroidism of renal origin: Secondary | ICD-10-CM | POA: Diagnosis not present

## 2018-11-22 DIAGNOSIS — E876 Hypokalemia: Secondary | ICD-10-CM | POA: Diagnosis not present

## 2018-11-23 DIAGNOSIS — I5042 Chronic combined systolic (congestive) and diastolic (congestive) heart failure: Secondary | ICD-10-CM | POA: Diagnosis not present

## 2018-11-23 DIAGNOSIS — I130 Hypertensive heart and chronic kidney disease with heart failure and stage 1 through stage 4 chronic kidney disease, or unspecified chronic kidney disease: Secondary | ICD-10-CM | POA: Diagnosis not present

## 2018-11-23 DIAGNOSIS — S32509D Unspecified fracture of unspecified pubis, subsequent encounter for fracture with routine healing: Secondary | ICD-10-CM | POA: Diagnosis not present

## 2018-11-23 DIAGNOSIS — S72002D Fracture of unspecified part of neck of left femur, subsequent encounter for closed fracture with routine healing: Secondary | ICD-10-CM | POA: Diagnosis not present

## 2018-11-23 DIAGNOSIS — S3210XD Unspecified fracture of sacrum, subsequent encounter for fracture with routine healing: Secondary | ICD-10-CM | POA: Diagnosis not present

## 2018-11-24 DIAGNOSIS — D631 Anemia in chronic kidney disease: Secondary | ICD-10-CM | POA: Diagnosis not present

## 2018-11-24 DIAGNOSIS — N2581 Secondary hyperparathyroidism of renal origin: Secondary | ICD-10-CM | POA: Diagnosis not present

## 2018-11-24 DIAGNOSIS — E876 Hypokalemia: Secondary | ICD-10-CM | POA: Diagnosis not present

## 2018-11-24 DIAGNOSIS — D689 Coagulation defect, unspecified: Secondary | ICD-10-CM | POA: Diagnosis not present

## 2018-11-24 DIAGNOSIS — N186 End stage renal disease: Secondary | ICD-10-CM | POA: Diagnosis not present

## 2018-11-26 DIAGNOSIS — N2581 Secondary hyperparathyroidism of renal origin: Secondary | ICD-10-CM | POA: Diagnosis not present

## 2018-11-26 DIAGNOSIS — D631 Anemia in chronic kidney disease: Secondary | ICD-10-CM | POA: Diagnosis not present

## 2018-11-26 DIAGNOSIS — E876 Hypokalemia: Secondary | ICD-10-CM | POA: Diagnosis not present

## 2018-11-26 DIAGNOSIS — N186 End stage renal disease: Secondary | ICD-10-CM | POA: Diagnosis not present

## 2018-11-26 DIAGNOSIS — D689 Coagulation defect, unspecified: Secondary | ICD-10-CM | POA: Diagnosis not present

## 2018-11-29 DIAGNOSIS — N2581 Secondary hyperparathyroidism of renal origin: Secondary | ICD-10-CM | POA: Diagnosis not present

## 2018-11-29 DIAGNOSIS — D631 Anemia in chronic kidney disease: Secondary | ICD-10-CM | POA: Diagnosis not present

## 2018-11-29 DIAGNOSIS — D689 Coagulation defect, unspecified: Secondary | ICD-10-CM | POA: Diagnosis not present

## 2018-11-29 DIAGNOSIS — E876 Hypokalemia: Secondary | ICD-10-CM | POA: Diagnosis not present

## 2018-11-29 DIAGNOSIS — N186 End stage renal disease: Secondary | ICD-10-CM | POA: Diagnosis not present

## 2018-11-30 DIAGNOSIS — S72002D Fracture of unspecified part of neck of left femur, subsequent encounter for closed fracture with routine healing: Secondary | ICD-10-CM | POA: Diagnosis not present

## 2018-11-30 DIAGNOSIS — I5042 Chronic combined systolic (congestive) and diastolic (congestive) heart failure: Secondary | ICD-10-CM | POA: Diagnosis not present

## 2018-11-30 DIAGNOSIS — I130 Hypertensive heart and chronic kidney disease with heart failure and stage 1 through stage 4 chronic kidney disease, or unspecified chronic kidney disease: Secondary | ICD-10-CM | POA: Diagnosis not present

## 2018-11-30 DIAGNOSIS — S3210XD Unspecified fracture of sacrum, subsequent encounter for fracture with routine healing: Secondary | ICD-10-CM | POA: Diagnosis not present

## 2018-11-30 DIAGNOSIS — S32509D Unspecified fracture of unspecified pubis, subsequent encounter for fracture with routine healing: Secondary | ICD-10-CM | POA: Diagnosis not present

## 2018-12-01 ENCOUNTER — Telehealth: Payer: Self-pay

## 2018-12-01 DIAGNOSIS — N186 End stage renal disease: Secondary | ICD-10-CM | POA: Diagnosis not present

## 2018-12-01 DIAGNOSIS — E876 Hypokalemia: Secondary | ICD-10-CM | POA: Diagnosis not present

## 2018-12-01 DIAGNOSIS — D689 Coagulation defect, unspecified: Secondary | ICD-10-CM | POA: Diagnosis not present

## 2018-12-01 DIAGNOSIS — D631 Anemia in chronic kidney disease: Secondary | ICD-10-CM | POA: Diagnosis not present

## 2018-12-01 DIAGNOSIS — N2581 Secondary hyperparathyroidism of renal origin: Secondary | ICD-10-CM | POA: Diagnosis not present

## 2018-12-01 NOTE — Telephone Encounter (Signed)
Received orders for pt needing PCP approval. PCP will review and sign, if appropriate.

## 2018-12-01 NOTE — Telephone Encounter (Signed)
Signed and put in box to go up front.  

## 2018-12-03 DIAGNOSIS — N186 End stage renal disease: Secondary | ICD-10-CM | POA: Diagnosis not present

## 2018-12-03 DIAGNOSIS — N2581 Secondary hyperparathyroidism of renal origin: Secondary | ICD-10-CM | POA: Diagnosis not present

## 2018-12-03 DIAGNOSIS — D689 Coagulation defect, unspecified: Secondary | ICD-10-CM | POA: Diagnosis not present

## 2018-12-03 DIAGNOSIS — D631 Anemia in chronic kidney disease: Secondary | ICD-10-CM | POA: Diagnosis not present

## 2018-12-03 DIAGNOSIS — E876 Hypokalemia: Secondary | ICD-10-CM | POA: Diagnosis not present

## 2018-12-06 DIAGNOSIS — E876 Hypokalemia: Secondary | ICD-10-CM | POA: Diagnosis not present

## 2018-12-06 DIAGNOSIS — D631 Anemia in chronic kidney disease: Secondary | ICD-10-CM | POA: Diagnosis not present

## 2018-12-06 DIAGNOSIS — N186 End stage renal disease: Secondary | ICD-10-CM | POA: Diagnosis not present

## 2018-12-06 DIAGNOSIS — D689 Coagulation defect, unspecified: Secondary | ICD-10-CM | POA: Diagnosis not present

## 2018-12-06 DIAGNOSIS — N2581 Secondary hyperparathyroidism of renal origin: Secondary | ICD-10-CM | POA: Diagnosis not present

## 2018-12-07 ENCOUNTER — Ambulatory Visit (INDEPENDENT_AMBULATORY_CARE_PROVIDER_SITE_OTHER): Payer: Medicare Other | Admitting: *Deleted

## 2018-12-07 DIAGNOSIS — I495 Sick sinus syndrome: Secondary | ICD-10-CM

## 2018-12-07 DIAGNOSIS — I5041 Acute combined systolic (congestive) and diastolic (congestive) heart failure: Secondary | ICD-10-CM | POA: Diagnosis not present

## 2018-12-08 ENCOUNTER — Telehealth: Payer: Self-pay

## 2018-12-08 DIAGNOSIS — D689 Coagulation defect, unspecified: Secondary | ICD-10-CM | POA: Diagnosis not present

## 2018-12-08 DIAGNOSIS — N186 End stage renal disease: Secondary | ICD-10-CM | POA: Diagnosis not present

## 2018-12-08 DIAGNOSIS — D631 Anemia in chronic kidney disease: Secondary | ICD-10-CM | POA: Diagnosis not present

## 2018-12-08 DIAGNOSIS — E876 Hypokalemia: Secondary | ICD-10-CM | POA: Diagnosis not present

## 2018-12-08 DIAGNOSIS — N2581 Secondary hyperparathyroidism of renal origin: Secondary | ICD-10-CM | POA: Diagnosis not present

## 2018-12-08 LAB — CUP PACEART REMOTE DEVICE CHECK
Battery Remaining Longevity: 125 mo
Battery Remaining Percentage: 95.5 %
Battery Voltage: 3.02 V
Brady Statistic AP VP Percent: 1 %
Brady Statistic AP VS Percent: 5.3 %
Brady Statistic AS VP Percent: 1 %
Brady Statistic AS VS Percent: 95 %
Brady Statistic RA Percent Paced: 5.2 %
Brady Statistic RV Percent Paced: 1 %
Date Time Interrogation Session: 20200723180624
Implantable Lead Implant Date: 20180719
Implantable Lead Implant Date: 20180719
Implantable Lead Location: 753859
Implantable Lead Location: 753860
Implantable Pulse Generator Implant Date: 20180719
Lead Channel Impedance Value: 430 Ohm
Lead Channel Impedance Value: 440 Ohm
Lead Channel Pacing Threshold Amplitude: 0.75 V
Lead Channel Pacing Threshold Amplitude: 0.75 V
Lead Channel Pacing Threshold Pulse Width: 0.5 ms
Lead Channel Pacing Threshold Pulse Width: 0.5 ms
Lead Channel Sensing Intrinsic Amplitude: 10.3 mV
Lead Channel Sensing Intrinsic Amplitude: 3.6 mV
Lead Channel Setting Pacing Amplitude: 2 V
Lead Channel Setting Pacing Amplitude: 2.5 V
Lead Channel Setting Pacing Pulse Width: 0.5 ms
Lead Channel Setting Sensing Sensitivity: 2 mV
Pulse Gen Model: 2272
Pulse Gen Serial Number: 8918511

## 2018-12-08 NOTE — Telephone Encounter (Signed)
Left message for patient to remind of missed remote transmission.  

## 2018-12-09 ENCOUNTER — Telehealth: Payer: Self-pay

## 2018-12-09 ENCOUNTER — Ambulatory Visit (HOSPITAL_BASED_OUTPATIENT_CLINIC_OR_DEPARTMENT_OTHER)
Admission: RE | Admit: 2018-12-09 | Discharge: 2018-12-09 | Disposition: A | Discharge status: Home / Self Care | Payer: Medicare Other | Source: Ambulatory Visit | Attending: Family Medicine | Admitting: Family Medicine

## 2018-12-09 ENCOUNTER — Encounter: Payer: Self-pay | Admitting: Family Medicine

## 2018-12-09 ENCOUNTER — Other Ambulatory Visit: Payer: Self-pay

## 2018-12-09 ENCOUNTER — Ambulatory Visit (INDEPENDENT_AMBULATORY_CARE_PROVIDER_SITE_OTHER): Payer: Medicare Other | Admitting: Family Medicine

## 2018-12-09 VITALS — BP 120/48 | HR 82 | Temp 97.5°F | Resp 17 | Ht 59.0 in

## 2018-12-09 DIAGNOSIS — R1909 Other intra-abdominal and pelvic swelling, mass and lump: Secondary | ICD-10-CM

## 2018-12-09 DIAGNOSIS — K4190 Unilateral femoral hernia, without obstruction or gangrene, not specified as recurrent: Secondary | ICD-10-CM

## 2018-12-09 DIAGNOSIS — M79661 Pain in right lower leg: Secondary | ICD-10-CM

## 2018-12-09 DIAGNOSIS — M7989 Other specified soft tissue disorders: Secondary | ICD-10-CM | POA: Insufficient documentation

## 2018-12-09 NOTE — Telephone Encounter (Signed)
Opened in error

## 2018-12-09 NOTE — Progress Notes (Signed)
OFFICE VISIT  12/09/2018   CC:  Chief Complaint  Patient presents with  . Leg Swelling    x2 weeks ago off and on. right leg and ankle. pain shooting down the front of her leg. feet are tender. mass on inner thing of the right leg. daughter noticed mass a week or two ago. doesnt cause her any pain   HPI:    [name] who presents accompanied by [name] for swelling and pain in right leg. Has had significant increase in R LL swelling mainly in ankle and foot.  Says foot is tender. Has occurred gradually over the last couple weeks.  During this time period she has also felt a bulge in R upper/inner thigh.  Lately she still hurts signif when trying to bear wt on R leg, seems to hurt more in thigh and knee region than in her hip (she had R hip fx and ORIF about 2 months ago and is still currently getting HH PT).  No n/v.  She is having BMs fine.  No abd distention.  No fevers.  No cough.  Left legs mild swelling is chronic and stable. She remains on T/Th/Sat HD for ESRD, says she got down to dry wt yesterday after HD.  ROS: no CP, no SOB, no wheezing, no cough, no dizziness, no HAs, no rashes, no melena/hematochezia.  No polyuria or polydipsia.    Past Medical History:  Diagnosis Date  . Anemia of chronic disease 2018   + anemia of CRI?  [name] Atrophy of left kidney    with absent blood flow by renal artery dopplers (Dr. Gwenlyn Found)  . 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 Chronic combined systolic and diastolic CHF (congestive heart failure) ([place])    a. 11/2016: echo showing EF of 35-40%, RV strain noted, mild MR and mild TR.   Echo showed much improved EF 12/2017 (55-60%)  . Chronic respiratory failure with hypoxia ([place]) 02/11/2017   Now on chronic O2 (intermittent as of summer 2019).    . Closed right hip fracture ([place]) 10/04/2018   ORIF  . Debilitated patient    WC dependent as of 2018.  Hosp bed + full assistance with most ADLs s/p hip  fx surg 09/2018.  [name] ESRD on hemodialysis ([place]) 01/2017   T/Th/Sat schedule- [facility].  Right basilic AV fistula 57/8469.  Graft 2019.  [name] History of adenomatous polyp of colon   . History of kidney stones   . History of subarachnoid hemorrhage 10/2014   after syncope and while on xarelto  . Hyperlipidemia   . Hypertension    Difficult to control, in the setting of one functioning kidney: pt was referred to nephrology by Dr. Gwenlyn Found 06/2015.  . Lumbar radiculopathy 2012  . Lumbar spinal stenosis 2019   facet injections only very short term relief.  L1 and L2 selective nerve root blocks helpful 04/2018.  . Lumbar spondylosis    MR 07/2016---no sign of spinal nerve compression or cord compression.  Pt set up with outpt ortho while admitted to hosp 07/2016.  . Malnourished ([place])   . Metatarsal fracture 06/10/2016   Nondisplaced, left 5th metatarsal--pt was referred to ortho  . Osteopenia 2014   T-score -2.1  . PAF (paroxysmal atrial fibrillation) (HCC)    Eliquis started after BRAO and CVA.  She was changed to warfarin 2018. All anticoag stopped due to recurrent falls.  . Presence of permanent cardiac pacemaker   .  Right rib fracture 12/2016   s/p fall  . Sick sinus syndrome ([place]) 11/2016   Dual chamber pacer insertion 2018 (Dr. Lovena Le)  . Stroke ([place]) 43/1540   cardioembolic (had CVA while on no anticoag)--"scattered subacute punctate infarcts: 1 in R parietal lobe and 2 in occipital cortex" on MRI br.  CT angio head/neck: aortic arch athero.  R ICA 20% stenosis, L ICA w/out any stenosis.  . Thoracic back pain 01/2017; 01/2018   2018: Facet?  Dr. Ramos->steroid injection.  01/2018-->CT T spine to check for a new comp fx-->none found.  Selective nerve root inj in T spine helpful on R side.   . Thoracic compression fracture ([place]) 07/2016   T3.  T7 and T8-- T8 kyphoplasty during hosp admission 07/2016.  Neuro referred pt to pain mgmt for consideration of injection 09/2016.  I referred her to  [facility] 08/2016 for consideration of calcitonin treatment.    Past Surgical History:  Procedure Laterality Date  . APPENDECTOMY  child  . AV FISTULA PLACEMENT Right 03/31/2017   Procedure: Right ARM Basilic vein transposition;  Surgeon: Conrad [place]burg, MD;  Location: [facility] OR;  Service: Vascular;  Laterality: Right;  . AV FISTULA PLACEMENT Right 12/06/2017   Procedure: INSERTION OF ARTERIOVENOUS (AV) GORE-TEX GRAFT ARM RIGHT UPPER EXTREMITY;  Surgeon: [name], MD;  Location: [facility];  Service: Vascular;  Laterality: Right;  . CARDIOVASCULAR STRESS TEST  01/31/2016   Stress myoview: NORMAL/Low risk.  EF 56%.  . [place]  . COLONOSCOPY  2015   + hx of adenomatous polyps.  Need digest health spec in [facility] records to see when pt due for next colonoscopy  . EYE SURGERY Bilateral    Catarct  . INSERTION OF DIALYSIS CATHETER Right 01/29/2017   Procedure: INSERTION OF DIALYSIS CATHETER- RIGHT INTERNAL JUGULAR;  Surgeon: [name], MD;  Location: [facility];  Service: Vascular;  Laterality: Right;  . INTRAMEDULLARY (IM) NAIL INTERTROCHANTERIC Right 10/04/2018   Procedure: INTRAMEDULLARY (IM) NAIL INTERTROCHANTRIC;  Surgeon: Nicholes Stairs, MD;  Location: [facility];  Service: Orthopedics;  Laterality: Right;  . IR GENERIC HISTORICAL  07/24/2016   IR KYPHO THORACIC WITH BONE BIOPSY 07/24/2016 Luanne Bras, MD [facility]  . KYPHOPLASTY  07/27/2016   T8  . PACEMAKER IMPLANT N/A 12/03/2016   Procedure: Pacemaker Implant;  Surgeon: Evans Lance, MD;  Location: [facility];  Service: Cardiovascular;  Laterality: N/A;  . Renal artery dopplers  02/26/2016   Her right renal dimension was 11 cm pole to pole with mild to moderate right renal artery stenosis. A right renal aortic ratio was 3.22 suggesting less than a 50% stenosis.  . Right upper arm AV Gore-Tex graft  12/06/2017   Dr. Donnetta Hutching  . TONSILLECTOMY    . TRANSTHORACIC ECHOCARDIOGRAM  01/29/2016; 11/2016; 12/2017   2017: EF  55-60%, normal LV wall motion, grade I DD.  No cardiac source of emboli was seen.  2018: EF 35-40%, could not assess DD due to a-fib, pulm HTN noted. 12/2017 EF 55-60%, nl wall motion, grd II DD, mod MR and pulm regurg, increased pulm pressure (peak 52).    Outpatient Medications Prior to Visit  Medication Sig Dispense Refill  . acetaminophen (TYLENOL) 500 MG tablet Take 500-1,000 mg 3 (three) times daily as needed by mouth for moderate pain.     [name] amiodarone (PACERONE) 200 MG tablet TAKE 1 TABLET BY MOUTH  DAILY (Patient taking differently: Take 200 mg by mouth  daily. ) 90 tablet 2  . b complex-vitamin c-folic acid (NEPHRO-VITE) 0.8 MG TABS tablet Take 1 tablet by mouth daily.    . carvedilol (COREG) 3.125 MG tablet TAKE 1 TABLET BY MOUTH TWO  TIMES DAILY (Patient taking differently: Take 3.125 mg by mouth 2 (two) times daily with a meal. ) 180 tablet 1  . docusate sodium (COLACE) 100 MG capsule Take 100 mg by mouth daily.    [name] HYDROcodone-acetaminophen (NORCO/VICODIN) 5-325 MG tablet 1-2 tabs po tid prn pain 120 tablet 0  . isosorbide mononitrate (IMDUR) 60 MG 24 hr tablet TAKE 1 TABLET BY MOUTH  DAILY 90 tablet 0  . Multiple Vitamins-Minerals (MULTIVITAMIN WITH MINERALS) tablet Take 1 tablet by mouth daily.    . ondansetron (ZOFRAN) 4 MG tablet Take 1 tablet (4 mg total) by mouth every 8 (eight) hours as needed for nausea or vomiting. 60 tablet 1  . oxyCODONE (OXY IR/ROXICODONE) 5 MG immediate release tablet 1-2 tabs po q6h prn pain 180 tablet 0  . polyvinyl alcohol (LIQUIFILM TEARS) 1.4 % ophthalmic solution Place 1-2 drops into both eyes as needed for dry eyes.    . pravastatin (PRAVACHOL) 40 MG tablet TAKE 1 TABLET BY MOUTH  EVERY EVENING (Patient taking differently: Take 40 mg by mouth daily. ) 90 tablet 1  . sevelamer carbonate (RENVELA) 800 MG tablet Take 1,600-2,400 mg by mouth See admin instructions. 2400 mg with meals and 1600 with snack    . vitamin C (ASCORBIC ACID) 500 MG tablet Take  500 mg by mouth daily.    . Vitamin D, Ergocalciferol, (DRISDOL) 1.25 MG (50000 UT) CAPS capsule Take 1 capsule (50,000 Units total) by mouth every Thursday. 12 capsule 3  . lidocaine-prilocaine (EMLA) cream APPLY SMALL AMOUNT TO ACCESS SITE (AVF) 1 HOUR BEFORE DIALYSIS. COVER WITH OCCLUSIVE DRESSING (SARAN WRAP)     No facility-administered medications prior to visit.     Allergies  Allergen Reactions  . Clonidine Derivatives Palpitations and Other (See Comments)    VERY SEDATED  . Adhesive [Tape] Other (See Comments)    Tears skin up  . Codeine Nausea Only  . Nickel Rash  . Sulfa Antibiotics Other (See Comments)    Reaction unknown    ROS As per HPI  PE: Blood pressure (!) 120/48, pulse 82, temperature (!) 97.5 F (36.4 C), temperature source Oral, resp. rate 17, height 4\' 11"  (1.499 m), SpO2 95 %. Gen: Alert, well appearing.  Patient is oriented to person, place, time, and situation. AFFECT: pleasant, lucid thought and speech. Abd: no distention or tenderness. Uppemost region of Right thigh, medial aspect -->firm 10cm oblong mass, stong arterial pulsation just medial to the mass.  The mass itself is not pulsatile.  No tenderness to palpation of mass. I cannot reduce this today.  Overlying skin w/out erythema or bruising. R LL with 4+ pitting edema extending into R foot, compared to approx 2+ on L LL. No tenderness or erythema of either calf.   LABS: none today    Chemistry    Latest labs from her dialysis center, no exact date on the paper, just "11/2018": Albumin 3.2, Ca total 8.7, Phos 5.9, iPTH 124, Hb 11.2, K 4.8  IMPRESSION AND PLAN:  1) Right groin mass, suspect femoral hernia.  This may be what is leading to her increase in swelling in right leg lately. Right leg swelling could also be from DVT so I'll get doppler u/s of R leg to evaluate for this further. Will  obtain limited pelvic ultrasound today to further assess her groin mass. Pt to go to med [facility]tr [facility] for  limited pelvic u/s and R LE venous doppler: set for 2 oclock today. No labs today. No new meds at this time.  An After Visit Summary was printed and given to the patient.  FOLLOW UP: Return for f/u to be determined based on results of imaging today.  Signed:  Crissie Sickles, MD           12/09/2018

## 2018-12-09 NOTE — Telephone Encounter (Signed)
Called patients daughter, scheduled for today at 9:30 to look at pts swelling and leg pain

## 2018-12-10 ENCOUNTER — Encounter: Payer: Self-pay | Admitting: Family Medicine

## 2018-12-10 ENCOUNTER — Telehealth: Payer: Self-pay | Admitting: Family Medicine

## 2018-12-10 ENCOUNTER — Other Ambulatory Visit: Payer: Self-pay | Admitting: Family Medicine

## 2018-12-10 DIAGNOSIS — N2581 Secondary hyperparathyroidism of renal origin: Secondary | ICD-10-CM | POA: Diagnosis not present

## 2018-12-10 DIAGNOSIS — D631 Anemia in chronic kidney disease: Secondary | ICD-10-CM | POA: Diagnosis not present

## 2018-12-10 DIAGNOSIS — N186 End stage renal disease: Secondary | ICD-10-CM | POA: Diagnosis not present

## 2018-12-10 DIAGNOSIS — E876 Hypokalemia: Secondary | ICD-10-CM | POA: Diagnosis not present

## 2018-12-10 DIAGNOSIS — D689 Coagulation defect, unspecified: Secondary | ICD-10-CM | POA: Diagnosis not present

## 2018-12-10 NOTE — Telephone Encounter (Signed)
Ultrasound imaging of R leg NEG for DVT yesterday. Pelvic ultrasound did show a small fat-containing inguinal hernia on R, BUT the imaging of the palpable R groin mass is indeterminate on u/s so the reading radiologist, Dr. Owens Shark, recommended CT pelvis.  I could not get this arranged as outpt this w/e. The palpable mass in R upper inner thigh is possibly some sort of sequelae from her 07/18/2018 pelvic fracture (nondisplaced fx of R superior ramus and nondisplaced right parasymphyseal pubic body fx) such as large callus vs displacement vs heterotopic ossification of a hematoma) +/- femoral hernia.  Pt (and daughter) updated me on her condition today and stated her right leg swelling is at least 50% worse than yesterday and her pain in the area of R groin mass is increased as well. I think the mass may be partially compressing her R femoral vein and leading to her progressive R leg swelling. I recommended she go to the emergency department after she is finished with dialysis this afternoon. Will contact pt on Monday, 12/12/18.  Signed:  Crissie Sickles, MD           12/10/2018

## 2018-12-12 ENCOUNTER — Telehealth: Payer: Self-pay

## 2018-12-12 ENCOUNTER — Encounter: Payer: Self-pay | Admitting: Family Medicine

## 2018-12-12 ENCOUNTER — Other Ambulatory Visit: Payer: Self-pay | Admitting: Family Medicine

## 2018-12-12 ENCOUNTER — Ambulatory Visit (HOSPITAL_BASED_OUTPATIENT_CLINIC_OR_DEPARTMENT_OTHER): Payer: Medicare Other

## 2018-12-12 DIAGNOSIS — S32511S Fracture of superior rim of right pubis, sequela: Secondary | ICD-10-CM

## 2018-12-12 DIAGNOSIS — R1909 Other intra-abdominal and pelvic swelling, mass and lump: Secondary | ICD-10-CM

## 2018-12-12 DIAGNOSIS — Z8781 Personal history of (healed) traumatic fracture: Secondary | ICD-10-CM

## 2018-12-12 NOTE — Progress Notes (Signed)
Orders only

## 2018-12-12 NOTE — Telephone Encounter (Signed)
Received hard copy for verbal orders to extend PT for 1wk2. PCP will review and sign, if appropriate.

## 2018-12-12 NOTE — Telephone Encounter (Signed)
Yes

## 2018-12-13 ENCOUNTER — Telehealth: Payer: Self-pay | Admitting: Family Medicine

## 2018-12-13 ENCOUNTER — Ambulatory Visit (HOSPITAL_BASED_OUTPATIENT_CLINIC_OR_DEPARTMENT_OTHER)
Admission: RE | Admit: 2018-12-13 | Discharge: 2018-12-13 | Disposition: A | Discharge status: Home / Self Care | Payer: Medicare Other | Source: Ambulatory Visit | Attending: Family Medicine | Admitting: Family Medicine

## 2018-12-13 ENCOUNTER — Other Ambulatory Visit: Payer: Self-pay

## 2018-12-13 DIAGNOSIS — N186 End stage renal disease: Secondary | ICD-10-CM | POA: Diagnosis not present

## 2018-12-13 DIAGNOSIS — N2581 Secondary hyperparathyroidism of renal origin: Secondary | ICD-10-CM | POA: Diagnosis not present

## 2018-12-13 DIAGNOSIS — R1909 Other intra-abdominal and pelvic swelling, mass and lump: Secondary | ICD-10-CM

## 2018-12-13 DIAGNOSIS — Z8781 Personal history of (healed) traumatic fracture: Secondary | ICD-10-CM | POA: Diagnosis not present

## 2018-12-13 DIAGNOSIS — D631 Anemia in chronic kidney disease: Secondary | ICD-10-CM | POA: Diagnosis not present

## 2018-12-13 DIAGNOSIS — E876 Hypokalemia: Secondary | ICD-10-CM | POA: Diagnosis not present

## 2018-12-13 DIAGNOSIS — S72122A Displaced fracture of lesser trochanter of left femur, initial encounter for closed fracture: Secondary | ICD-10-CM | POA: Diagnosis not present

## 2018-12-13 DIAGNOSIS — S32511S Fracture of superior rim of right pubis, sequela: Secondary | ICD-10-CM | POA: Diagnosis not present

## 2018-12-13 DIAGNOSIS — D689 Coagulation defect, unspecified: Secondary | ICD-10-CM | POA: Diagnosis not present

## 2018-12-13 NOTE — Telephone Encounter (Signed)
PCP signed and placed forms in bin to go up front and faxed.

## 2018-12-13 NOTE — Telephone Encounter (Signed)
A user error has taken place: encounter opened in error, closed for administrative reasons.

## 2018-12-13 NOTE — Telephone Encounter (Signed)
Verbal orders given to [facility]. Will have PCP sign hard copy and fax.

## 2018-12-14 ENCOUNTER — Other Ambulatory Visit: Payer: Self-pay | Admitting: Family Medicine

## 2018-12-14 DIAGNOSIS — Z9889 Other specified postprocedural states: Secondary | ICD-10-CM

## 2018-12-14 DIAGNOSIS — M7989 Other specified soft tissue disorders: Secondary | ICD-10-CM

## 2018-12-14 DIAGNOSIS — R1909 Other intra-abdominal and pelvic swelling, mass and lump: Secondary | ICD-10-CM

## 2018-12-14 DIAGNOSIS — Z8781 Personal history of (healed) traumatic fracture: Secondary | ICD-10-CM

## 2018-12-15 DIAGNOSIS — E876 Hypokalemia: Secondary | ICD-10-CM | POA: Diagnosis not present

## 2018-12-15 DIAGNOSIS — D689 Coagulation defect, unspecified: Secondary | ICD-10-CM | POA: Diagnosis not present

## 2018-12-15 DIAGNOSIS — D631 Anemia in chronic kidney disease: Secondary | ICD-10-CM | POA: Diagnosis not present

## 2018-12-15 DIAGNOSIS — I130 Hypertensive heart and chronic kidney disease with heart failure and stage 1 through stage 4 chronic kidney disease, or unspecified chronic kidney disease: Secondary | ICD-10-CM | POA: Diagnosis not present

## 2018-12-15 DIAGNOSIS — N186 End stage renal disease: Secondary | ICD-10-CM | POA: Diagnosis not present

## 2018-12-15 DIAGNOSIS — I5042 Chronic combined systolic (congestive) and diastolic (congestive) heart failure: Secondary | ICD-10-CM | POA: Diagnosis not present

## 2018-12-15 DIAGNOSIS — N2581 Secondary hyperparathyroidism of renal origin: Secondary | ICD-10-CM | POA: Diagnosis not present

## 2018-12-15 DIAGNOSIS — S32509D Unspecified fracture of unspecified pubis, subsequent encounter for fracture with routine healing: Secondary | ICD-10-CM | POA: Diagnosis not present

## 2018-12-15 DIAGNOSIS — S3210XD Unspecified fracture of sacrum, subsequent encounter for fracture with routine healing: Secondary | ICD-10-CM | POA: Diagnosis not present

## 2018-12-15 DIAGNOSIS — S72002D Fracture of unspecified part of neck of left femur, subsequent encounter for closed fracture with routine healing: Secondary | ICD-10-CM | POA: Diagnosis not present

## 2018-12-16 ENCOUNTER — Encounter: Payer: Self-pay | Admitting: Family Medicine

## 2018-12-16 ENCOUNTER — Telehealth: Payer: Self-pay | Admitting: Family Medicine

## 2018-12-16 DIAGNOSIS — N186 End stage renal disease: Secondary | ICD-10-CM | POA: Diagnosis not present

## 2018-12-16 DIAGNOSIS — Z992 Dependence on renal dialysis: Secondary | ICD-10-CM | POA: Diagnosis not present

## 2018-12-16 DIAGNOSIS — I120 Hypertensive chronic kidney disease with stage 5 chronic kidney disease or end stage renal disease: Secondary | ICD-10-CM | POA: Diagnosis not present

## 2018-12-16 NOTE — Telephone Encounter (Signed)
Britt: for some reason there was a problem and ortho never called Valerie Terrell to get her in their office.  Daughter [name] upset. I talked to Dr. Stann Mainland, pt's orthopedic surgeon, today about her case today and he agreed to see her in his office. He said his office would contact pt today (but I suspect her appt won't be today) . Just wanted to keep you in the loop in case questions or calls come in to you about it.

## 2018-12-16 NOTE — Telephone Encounter (Signed)
Patient's daughter updated regarding appt. She should be receiving a call by the end of today to set up an appt.

## 2018-12-17 DIAGNOSIS — N186 End stage renal disease: Secondary | ICD-10-CM | POA: Diagnosis not present

## 2018-12-17 DIAGNOSIS — N2581 Secondary hyperparathyroidism of renal origin: Secondary | ICD-10-CM | POA: Diagnosis not present

## 2018-12-17 DIAGNOSIS — D631 Anemia in chronic kidney disease: Secondary | ICD-10-CM | POA: Diagnosis not present

## 2018-12-17 DIAGNOSIS — D509 Iron deficiency anemia, unspecified: Secondary | ICD-10-CM | POA: Diagnosis not present

## 2018-12-17 DIAGNOSIS — Z992 Dependence on renal dialysis: Secondary | ICD-10-CM | POA: Diagnosis not present

## 2018-12-17 DIAGNOSIS — D689 Coagulation defect, unspecified: Secondary | ICD-10-CM | POA: Diagnosis not present

## 2018-12-17 DIAGNOSIS — E876 Hypokalemia: Secondary | ICD-10-CM | POA: Diagnosis not present

## 2018-12-18 DIAGNOSIS — I5042 Chronic combined systolic (congestive) and diastolic (congestive) heart failure: Secondary | ICD-10-CM | POA: Diagnosis not present

## 2018-12-18 DIAGNOSIS — G894 Chronic pain syndrome: Secondary | ICD-10-CM | POA: Diagnosis not present

## 2018-12-20 DIAGNOSIS — D689 Coagulation defect, unspecified: Secondary | ICD-10-CM | POA: Diagnosis not present

## 2018-12-20 DIAGNOSIS — N186 End stage renal disease: Secondary | ICD-10-CM | POA: Diagnosis not present

## 2018-12-20 DIAGNOSIS — D509 Iron deficiency anemia, unspecified: Secondary | ICD-10-CM | POA: Diagnosis not present

## 2018-12-20 DIAGNOSIS — N2581 Secondary hyperparathyroidism of renal origin: Secondary | ICD-10-CM | POA: Diagnosis not present

## 2018-12-20 DIAGNOSIS — D631 Anemia in chronic kidney disease: Secondary | ICD-10-CM | POA: Diagnosis not present

## 2018-12-20 DIAGNOSIS — E876 Hypokalemia: Secondary | ICD-10-CM | POA: Diagnosis not present

## 2018-12-20 DIAGNOSIS — Z992 Dependence on renal dialysis: Secondary | ICD-10-CM | POA: Diagnosis not present

## 2018-12-21 DIAGNOSIS — J9601 Acute respiratory failure with hypoxia: Secondary | ICD-10-CM | POA: Diagnosis not present

## 2018-12-21 DIAGNOSIS — J9621 Acute and chronic respiratory failure with hypoxia: Secondary | ICD-10-CM | POA: Diagnosis not present

## 2018-12-21 DIAGNOSIS — R5381 Other malaise: Secondary | ICD-10-CM | POA: Diagnosis not present

## 2018-12-21 DIAGNOSIS — Z789 Other specified health status: Secondary | ICD-10-CM | POA: Diagnosis not present

## 2018-12-21 NOTE — Progress Notes (Signed)
Remote pacemaker transmission.   

## 2018-12-22 DIAGNOSIS — D509 Iron deficiency anemia, unspecified: Secondary | ICD-10-CM | POA: Diagnosis not present

## 2018-12-22 DIAGNOSIS — D689 Coagulation defect, unspecified: Secondary | ICD-10-CM | POA: Diagnosis not present

## 2018-12-22 DIAGNOSIS — D631 Anemia in chronic kidney disease: Secondary | ICD-10-CM | POA: Diagnosis not present

## 2018-12-22 DIAGNOSIS — Z992 Dependence on renal dialysis: Secondary | ICD-10-CM | POA: Diagnosis not present

## 2018-12-22 DIAGNOSIS — N186 End stage renal disease: Secondary | ICD-10-CM | POA: Diagnosis not present

## 2018-12-22 DIAGNOSIS — N2581 Secondary hyperparathyroidism of renal origin: Secondary | ICD-10-CM | POA: Diagnosis not present

## 2018-12-22 DIAGNOSIS — E876 Hypokalemia: Secondary | ICD-10-CM | POA: Diagnosis not present

## 2018-12-23 ENCOUNTER — Other Ambulatory Visit: Payer: Self-pay

## 2018-12-23 ENCOUNTER — Encounter: Payer: Self-pay | Admitting: Family Medicine

## 2018-12-23 ENCOUNTER — Emergency Department (HOSPITAL_COMMUNITY)
Admission: EM | Admit: 2018-12-23 | Discharge: 2018-12-23 | Disposition: A | Discharge status: Home / Self Care | Payer: Medicare Other | Attending: Emergency Medicine | Admitting: Emergency Medicine

## 2018-12-23 ENCOUNTER — Encounter (HOSPITAL_COMMUNITY): Payer: Self-pay | Admitting: Emergency Medicine

## 2018-12-23 ENCOUNTER — Emergency Department (HOSPITAL_BASED_OUTPATIENT_CLINIC_OR_DEPARTMENT_OTHER): Payer: Medicare Other

## 2018-12-23 DIAGNOSIS — Z87891 Personal history of nicotine dependence: Secondary | ICD-10-CM | POA: Insufficient documentation

## 2018-12-23 DIAGNOSIS — M7989 Other specified soft tissue disorders: Secondary | ICD-10-CM

## 2018-12-23 DIAGNOSIS — Z8673 Personal history of transient ischemic attack (TIA), and cerebral infarction without residual deficits: Secondary | ICD-10-CM | POA: Diagnosis not present

## 2018-12-23 DIAGNOSIS — R600 Localized edema: Secondary | ICD-10-CM | POA: Diagnosis not present

## 2018-12-23 DIAGNOSIS — Z79899 Other long term (current) drug therapy: Secondary | ICD-10-CM | POA: Diagnosis not present

## 2018-12-23 DIAGNOSIS — Z992 Dependence on renal dialysis: Secondary | ICD-10-CM | POA: Insufficient documentation

## 2018-12-23 DIAGNOSIS — N186 End stage renal disease: Secondary | ICD-10-CM | POA: Insufficient documentation

## 2018-12-23 DIAGNOSIS — Z950 Presence of cardiac pacemaker: Secondary | ICD-10-CM | POA: Diagnosis not present

## 2018-12-23 DIAGNOSIS — I5042 Chronic combined systolic (congestive) and diastolic (congestive) heart failure: Secondary | ICD-10-CM | POA: Diagnosis not present

## 2018-12-23 DIAGNOSIS — Z7982 Long term (current) use of aspirin: Secondary | ICD-10-CM | POA: Insufficient documentation

## 2018-12-23 DIAGNOSIS — R52 Pain, unspecified: Secondary | ICD-10-CM

## 2018-12-23 DIAGNOSIS — I132 Hypertensive heart and chronic kidney disease with heart failure and with stage 5 chronic kidney disease, or end stage renal disease: Secondary | ICD-10-CM | POA: Diagnosis not present

## 2018-12-23 DIAGNOSIS — R2241 Localized swelling, mass and lump, right lower limb: Secondary | ICD-10-CM | POA: Diagnosis present

## 2018-12-23 LAB — BASIC METABOLIC PANEL
Anion gap: 15 (ref 5–15)
BUN: 15 mg/dL (ref 8–23)
CO2: 29 mmol/L (ref 22–32)
Calcium: 8.5 mg/dL — ABNORMAL LOW (ref 8.9–10.3)
Chloride: 91 mmol/L — ABNORMAL LOW (ref 98–111)
Creatinine, Ser: 3.57 mg/dL — ABNORMAL HIGH (ref 0.44–1.00)
GFR calc Af Amer: 13 mL/min — ABNORMAL LOW (ref >60)
GFR calc non Af Amer: 11 mL/min — ABNORMAL LOW (ref >60)
Glucose, Bld: 122 mg/dL — ABNORMAL HIGH (ref 70–99)
Potassium: 4.1 mmol/L (ref 3.5–5.1)
Sodium: 135 mmol/L (ref 135–145)

## 2018-12-23 LAB — CBC
HCT: 36.2 % (ref 36.0–46.0)
Hemoglobin: 11 g/dL — ABNORMAL LOW (ref 12.0–15.0)
MCH: 29.6 pg (ref 26.0–34.0)
MCHC: 30.4 g/dL (ref 30.0–36.0)
MCV: 97.6 fL (ref 80.0–100.0)
Platelets: 259 10*3/uL (ref 150–400)
RBC: 3.71 MIL/uL — ABNORMAL LOW (ref 3.87–5.11)
RDW: 20.1 % — ABNORMAL HIGH (ref 11.5–15.5)
WBC: 7.7 10*3/uL (ref 4.0–10.5)
nRBC: 0 % (ref 0.0–0.2)

## 2018-12-23 NOTE — Discharge Instructions (Addendum)
Your ultrasound today showed no evidence of blood clots in your leg.  Follow up with Dr. Magda Bernheim at [facility] for further evaluation of your leg swelling.

## 2018-12-23 NOTE — ED Provider Notes (Signed)
[facility] EMERGENCY DEPARTMENT Provider Note   CSN: [id number] Arrival date & time: 12/23/18  1355    History   Chief Complaint Chief Complaint  Patient presents with  . Leg Swelling    HPI Valerie Terrell is a 85 y.o. female with history of ESRD on HD and recent right hip fracture status post IM nailing on 10/04/2018 presenting to the ED complaining of pain and swelling to the right lower extremity.  Patient reports her pain is located around the right hip and radiates down the leg to the right knee and right foot.  Patient reports that she had previously been weightbearing with a walker since her surgery but has since become unable to bear weight due to worsening pain and swelling and now has to use a wheelchair.  She reports she had been working with PT but has been unable to do so over the past 3 weeks due to pain.  Patient reports she has had swelling since the surgery, but states this acutely worsened over the past week, becoming significantly worse over the past 2 to 3 days.  Patient had a negative DVT study of the right leg on 7/24.  Patient reports she mostly lays in bed but does get up to a wheelchair some at home.  Patient reports she has not missed any dialysis sessions recently.  She denies any recent fever, chills, cough, shortness of breath, chest pain, abdominal pain, nausea, vomiting, diarrhea, or any other complaints.     The history is provided by the patient and medical records.    Past Medical History:  Diagnosis Date  . Anemia of chronic disease 2018   + anemia of CRI?  [name] Atrophy of left kidney    with absent blood flow by renal artery dopplers (Dr. Gwenlyn Found)  . 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 Chronic combined systolic and diastolic CHF (congestive heart failure) ([place])    a. 11/2016: echo showing EF of 35-40%, RV strain noted, mild MR and mild TR.   Echo showed much improved EF 12/2017 (55-60%)  . Chronic respiratory failure with  hypoxia ([place]) 02/11/2017   Now on chronic O2 (intermittent as of summer 2019).    . Closed fracture of right superior pubic ramus ([place]) 07/18/2018   Non op mgmt (Dr. Victorino December was ortho who saw her in hosp but i don't think pt followed up with him).  . Closed right hip fracture ([place]) 10/04/2018   ORIF  . Debilitated patient    WC dependent as of 2018.  Hosp bed + full assistance with most ADLs s/p hip fx surg 09/2018.  [name] ESRD on hemodialysis ([place]) 01/2017   T/Th/Sat schedule- [place].  Right basilic AV fistula [id number].  Graft 2019.  [name] History of adenomatous polyp of colon   . History of kidney stones   . History of subarachnoid hemorrhage 10/2014   after syncope and while on xarelto  . Hyperlipidemia   . Hypertension    Difficult to control, in the setting of one functioning kidney: pt was referred to nephrology by Dr. Gwenlyn Found 06/2015.  . Lumbar radiculopathy 2012  . Lumbar spinal stenosis 2019   facet injections only very short term relief.  L1 and L2 selective nerve root blocks helpful 04/2018.  . Lumbar spondylosis    MR 07/2016---no sign of spinal nerve compression or cord compression.  Pt set up with outpt ortho while admitted to hosp 07/2016.  . Malnourished ([place])   .  Metatarsal fracture 06/10/2016   Nondisplaced, left 5th metatarsal--pt was referred to ortho  . Osteopenia 2014   T-score -2.1  . PAF (paroxysmal atrial fibrillation) (HCC)    Eliquis started after BRAO and CVA.  She was changed to warfarin 2018. All anticoag stopped due to recurrent falls.  . Presence of permanent cardiac pacemaker   . Right rib fracture 12/2016   s/p fall  . Sick sinus syndrome ([place]) 11/2016   Dual chamber pacer insertion 2018 (Dr. Lovena Le)  . Stroke ([place]) 0000000   cardioembolic (had CVA while on no anticoag)--"scattered subacute punctate infarcts: 1 in R parietal lobe and 2 in occipital cortex" on MRI br.  CT angio head/neck: aortic arch athero.  R ICA 20% stenosis, L ICA w/out any  stenosis.  . Thoracic back pain 01/2017; 01/2018   2018: Facet?  Dr. Ramos->steroid injection.  01/2018-->CT T spine to check for a new comp fx-->none found.  Selective nerve root inj in T spine helpful on R side.   . Thoracic compression fracture ([place]) 07/2016   T3.  T7 and T8-- T8 kyphoplasty during hosp admission 07/2016.  Neuro referred pt to pain mgmt for consideration of injection 09/2016.  I referred her to endo 08/2016 for consideration of calcitonin treatment.    Patient Active Problem List   Diagnosis Date Noted  . Hip fracture ([place]) 10/04/2018  . Fracture of pubic ramus ([place]) 07/19/2018  . Fracture of sacrum ([place]) 07/19/2018  . Perineal hematoma 07/19/2018  . Fever 07/19/2018  . Acute blood loss anemia 07/19/2018  . ESRD (end stage renal disease) on dialysis ([place]) 05/20/2018  . Chronic back pain 05/14/2018  . Chronic pain syndrome 05/14/2018  . Hypoxia 12/14/2017  . Dyspnea 12/14/2017  . Degeneration of thoracic intervertebral disc 07/26/2017  . Acute respiratory failure ([place]) 02/13/2017  . Hyperkalemia 02/13/2017  . Chronic respiratory failure with hypoxia ([place]) 02/11/2017  . Debilitated patient 02/07/2017  . Poor tolerance for ambulation 02/07/2017  . Fracture of one rib, right side, initial encounter for closed fracture 01/11/2017  . Closed fracture of one rib of right side   . Chronic combined systolic and diastolic heart failure ([place]) 01/05/2017  . Palpitations 12/15/2016  . Hypokalemia 12/14/2016  . Leukocytosis 12/14/2016  . Long term (current) use of anticoagulants [Z79.01] 12/11/2016  . Non-ischemic cardiomyopathy ([place]) 12/07/2016  . Acute combined systolic and diastolic heart failure ([place])  12/07/2016  . Cardiac pacemaker   . Acute on chronic respiratory failure with hypoxia ([place])   . Acute pulmonary edema with congestive heart failure ([place]) 11/30/2016  . Elevated troponin level 11/30/2016  . Anemia 11/30/2016  . Chronic midline thoracic back pain 09/15/2016   . History of CVA (cerebrovascular accident) 09/15/2016  . Hyperlipidemia   . Nausea & vomiting 08/05/2016  . Dehydration 08/05/2016  . Osteoporosis 07/28/2016  . Closed compression fracture of thoracic vertebra ([place]) 07/23/2016  . Thoracic compression fracture ([place]) 07/22/2016  . Flank pain 07/11/2016  . Hypoalbuminemia 07/11/2016  . Renal artery stenosis ([place]) 06/24/2016  . Nondisplaced fracture of fifth metatarsal bone, left foot, initial encounter for closed fracture 06/10/2016  . Rib contusion, left, initial encounter 06/10/2016  . Single kidney 03/13/2016  . Chest pain   . Dyslipidemia   . PAF (paroxysmal atrial fibrillation) ([place]) 01/28/2016  . Essential hypertension 01/28/2016  . History of cardioembolic stroke [id number]  . Personal history of subarachnoid hemorrhage 01/28/2016  . CKD (chronic kidney disease), stage IV ([place]) 01/28/2016  . Gait disturbance 01/27/2016  .  Mitral valve disease 01/08/2014  . Colon polyps 07/10/2013  . Lumbar radiculopathy 02/04/2011    Past Surgical History:  Procedure Laterality Date  . APPENDECTOMY  child  . AV FISTULA PLACEMENT Right 03/31/2017   Procedure: Right ARM Basilic vein transposition;  Surgeon: Conrad , MD;  Location: [facility] OR;  Service: Vascular;  Laterality: Right;  . AV FISTULA PLACEMENT Right 12/06/2017   Procedure: INSERTION OF ARTERIOVENOUS (AV) GORE-TEX GRAFT ARM RIGHT UPPER EXTREMITY;  Surgeon: [name], MD;  Location: [facility];  Service: Vascular;  Laterality: Right;  . CARDIOVASCULAR STRESS TEST  01/31/2016   Stress myoview: NORMAL/Low risk.  EF 56%.  . [place]  . COLONOSCOPY  2015   + hx of adenomatous polyps.  Need digest health spec in [place] records to see when pt due for next colonoscopy  . EYE SURGERY Bilateral    Catarct  . INSERTION OF DIALYSIS CATHETER Right 01/29/2017   Procedure: INSERTION OF DIALYSIS CATHETER- RIGHT INTERNAL JUGULAR;  Surgeon: [name], MD;  Location: [facility];   Service: Vascular;  Laterality: Right;  . INTRAMEDULLARY (IM) NAIL INTERTROCHANTERIC Right 10/04/2018   Procedure: INTRAMEDULLARY (IM) NAIL INTERTROCHANTRIC;  Surgeon: Nicholes Stairs, MD;  Location: [facility];  Service: Orthopedics;  Laterality: Right;  . IR GENERIC HISTORICAL  07/24/2016   IR KYPHO THORACIC WITH BONE BIOPSY 07/24/2016 Luanne Bras, MD [facility]-INTERV RAD  . KYPHOPLASTY  07/27/2016   T8  . PACEMAKER IMPLANT N/A 12/03/2016   Procedure: Pacemaker Implant;  Surgeon: Evans Lance, MD;  Location: [facility] CV LAB;  Service: Cardiovascular;  Laterality: N/A;  . Renal artery dopplers  02/26/2016   Her right renal dimension was 11 cm pole to pole with mild to moderate right renal artery stenosis. A right renal aortic ratio was 3.22 suggesting less than a 50% stenosis.  . Right upper arm AV Gore-Tex graft  12/06/2017   Dr. Donnetta Hutching  . TONSILLECTOMY    . TRANSTHORACIC ECHOCARDIOGRAM  01/29/2016; 11/2016; 12/2017   2017: EF 55-60%, normal LV wall motion, grade I DD.  No cardiac source of emboli was seen.  2018: EF 35-40%, could not assess DD due to a-fib, pulm HTN noted. 12/2017 EF 55-60%, nl wall motion, grd II DD, mod MR and pulm regurg, increased pulm pressure (peak 52).     OB History   No obstetric history on file.      Home Medications    Prior to Admission medications   Medication Sig Start Date End Date Taking? Authorizing Provider  acetaminophen (TYLENOL) 500 MG tablet Take 500-1,000 mg 3 (three) times daily as needed by mouth for moderate pain.    Yes [provider]  amiodarone (PACERONE) 200 MG tablet TAKE 1 TABLET BY MOUTH  DAILY Patient taking differently: Take 200 mg by mouth See admin instructions. Take one tablet (200 mg) by mouth on Sunday, Monday, Wednesday, Friday mornings, take one tablet (200 mg) on Tuesday, Thursday, Saturday after dialysis 04/11/18  Yes Lorretta Harp, MD  aspirin EC 325 MG tablet Take 325 mg by mouth every evening.   Yes [provider]  b complex-vitamin c-folic acid (NEPHRO-VITE) 0.8 MG TABS tablet Take 1 tablet by mouth See admin instructions. Take one tablet by mouth on Tuesday, Thursday, Saturday - after dialysis 09/07/18  Yes [provider]  carvedilol (COREG) 3.125 MG tablet TAKE 1 TABLET BY MOUTH TWO  TIMES DAILY Patient taking differently: Take 3.125 mg by mouth 2 (two)  times daily as needed (SBP >120).  06/27/18  Yes Lorretta Harp, MD  docusate sodium (COLACE) 100 MG capsule Take 100 mg by mouth at bedtime.    Yes [provider]  HYDROcodone-acetaminophen (NORCO/VICODIN) 5-325 MG tablet 1-2 tabs po tid prn pain Patient taking differently: Take 1 tablet by mouth 3 (three) times daily as needed for moderate pain.  11/16/18  Yes McGowen, Adrian Blackwater, MD  isosorbide mononitrate (IMDUR) 60 MG 24 hr tablet TAKE 1 TABLET BY MOUTH  DAILY Patient taking differently: Take 60 mg by mouth daily. Take one tablet (60 mg) by mouth on Sunday, Monday, Wednesday, Friday mornings, take one tablet (60 mg) on Tuesday, Thursday, Saturday after dialysis 10/31/18  Yes McGowen, Adrian Blackwater, MD  lidocaine-prilocaine (EMLA) cream Apply 1 application topically See admin instructions. Apply small amount to access site (AVF) one hour before dialysis, cover with occlusive dressing (saran wrap) 11/11/18  Yes [provider]  Multiple Vitamins-Minerals (MULTIVITAMIN WITH MINERALS) tablet Take 1 tablet by mouth at bedtime.    Yes [provider]  ondansetron (ZOFRAN) 4 MG tablet Take 1 tablet (4 mg total) by mouth every 8 (eight) hours as needed for nausea or vomiting. 06/03/18  Yes McGowen, Adrian Blackwater, MD  oxyCODONE (OXY IR/ROXICODONE) 5 MG immediate release tablet 1-2 tabs po q6h prn pain Patient taking differently: 5-10 mg every 6 (six) hours as needed for severe pain.  10/17/18  Yes McGowen, Adrian Blackwater, MD  polyvinyl alcohol (LIQUIFILM TEARS) 1.4 % ophthalmic solution Place 1-2 drops into both eyes daily as needed  for dry eyes.    Yes [provider]  pravastatin (PRAVACHOL) 40 MG tablet TAKE 1 TABLET BY MOUTH  EVERY EVENING Patient taking differently: Take 40 mg by mouth at bedtime.  03/09/18  Yes McGowen, Adrian Blackwater, MD  sevelamer carbonate (RENVELA) 800 MG tablet Take 800-2,400 mg by mouth See admin instructions. Take two - three tablets (1600 mg - 2400 mg) by mouth up to three times daily with meals, take one tablet (800 mg) with snacks   Yes [provider]  vitamin C (ASCORBIC ACID) 500 MG tablet Take 500 mg by mouth daily.   Yes [provider]  Vitamin D, Ergocalciferol, (DRISDOL) 1.25 MG (50000 UT) CAPS capsule Take 1 capsule (50,000 Units total) by mouth every Thursday. 04/28/18  Yes McGowen, Adrian Blackwater, MD    Family History Family History  Problem Relation Age of Onset  . Cancer Mother   . Heart disease Father   . Early death Father   . Sudden Cardiac Death Neg Hx     Social History Social History   Tobacco Use  . Smoking status: Former Smoker    Years: 22.00    Quit date: 03/17/1972    Years since quitting: 46.8  . Smokeless tobacco: Never Used  Substance Use Topics  . Alcohol use: Yes    Alcohol/week: 1.0 standard drinks    Types: 1 Glasses of wine per week    Comment: wine  . Drug use: No     Allergies   Clonidine derivatives, Adhesive [tape], Codeine, Nickel, and Sulfa antibiotics   Review of Systems Review of Systems  Constitutional: Negative for chills and fever.  HENT: Negative for ear pain and sore throat.   Eyes: Negative for pain and visual disturbance.  Respiratory: Negative for cough and shortness of breath.   Cardiovascular: Positive for leg swelling. Negative for chest pain and palpitations.  Gastrointestinal: Negative for abdominal pain and  vomiting.  Genitourinary: Negative for dysuria and hematuria.  Musculoskeletal: Positive for arthralgias and gait problem. Negative for back pain.  Skin: Negative for color change and rash.   Neurological: Negative for seizures and syncope.  All other systems reviewed and are negative.    Physical Exam Updated Vital Signs BP (!) 143/50 (BP Location: Right Arm)   Pulse 70   Temp 98.1 F (36.7 C) (Oral)   Resp 18   LMP  (LMP Unknown)   SpO2 96%   Physical Exam Vitals signs and nursing note reviewed.  Constitutional:      General: She is not in acute distress.    Appearance: Normal appearance. She is normal weight. She is not ill-appearing, toxic-appearing or diaphoretic.  HENT:     Head: Normocephalic and atraumatic.     Nose: Nose normal. No congestion or rhinorrhea.     Mouth/Throat:     Mouth: Mucous membranes are moist.     Pharynx: Oropharynx is clear. No oropharyngeal exudate or posterior oropharyngeal erythema.  Eyes:     Extraocular Movements: Extraocular movements intact.     Conjunctiva/sclera: Conjunctivae normal.     Pupils: Pupils are equal, round, and reactive to light.  Neck:     Musculoskeletal: Normal range of motion and neck supple. No neck rigidity or muscular tenderness.  Cardiovascular:     Rate and Rhythm: Normal rate and regular rhythm.     Pulses: Normal pulses.     Heart sounds: Normal heart sounds. No murmur. No friction rub. No gallop.   Pulmonary:     Effort: Pulmonary effort is normal. No respiratory distress.     Breath sounds: Normal breath sounds. No stridor. No wheezing, rhonchi or rales.  Abdominal:     General: Abdomen is flat. There is no distension.     Palpations: Abdomen is soft.     Tenderness: There is no abdominal tenderness. There is no guarding or rebound.  Musculoskeletal:        General: No deformity or signs of injury.     Right lower leg: Edema present.     Left lower leg: No edema.     Comments: Right lower leg and foot are markedly swollen compared to left.  There is no erythema or weeping from the leg.  Skin:    General: Skin is warm and dry.  Neurological:     General: No focal deficit present.      Mental Status: She is alert and oriented to person, place, and time. Mental status is at baseline.     Cranial Nerves: No cranial nerve deficit.     Sensory: No sensory deficit.     Motor: No weakness.  Psychiatric:        Mood and Affect: Mood normal.        Behavior: Behavior normal.      ED Treatments / Results  Labs (all labs ordered are listed, but only abnormal results are displayed) Labs Reviewed  CBC - Abnormal; Notable for the following components:      Result Value   RBC 3.71 (*)    Hemoglobin 11.0 (*)    RDW 20.1 (*)    All other components within normal limits  BASIC METABOLIC PANEL - Abnormal; Notable for the following components:   Chloride 91 (*)    Glucose, Bld 122 (*)    Creatinine, Ser 3.57 (*)    Calcium 8.5 (*)    GFR calc non Af Amer 11 (*)  GFR calc Af Amer 13 (*)    All other components within normal limits    EKG None  Radiology Vas [place] Lower Extremity Venous (dvt) (only [facility])  Result Date: 12/23/2018  Lower Venous Study Indications: Pain, and Swelling.  Risk Factors: Surgery right hip replacement. Performing Technologist: Toma Copier RVS  Examination Guidelines: A complete evaluation includes B-mode imaging, spectral Doppler, color Doppler, and power Doppler as needed of all accessible portions of each vessel. Bilateral testing is considered an integral part of a complete examination. Limited examinations for reoccurring indications may be performed as noted.  +---------+---------------+---------+-----------+----------+-------------------+ RIGHT    CompressibilityPhasicitySpontaneityPropertiesSummary             +---------+---------------+---------+-----------+----------+-------------------+ CFV      Full           Yes      Yes                                      +---------+---------------+---------+-----------+----------+-------------------+ SFJ      Full                                                              +---------+---------------+---------+-----------+----------+-------------------+ FV Prox  Full           Yes      Yes                                      +---------+---------------+---------+-----------+----------+-------------------+ FV Mid   Full                                                             +---------+---------------+---------+-----------+----------+-------------------+ FV DistalFull           Yes      Yes                                      +---------+---------------+---------+-----------+----------+-------------------+ PFV      Full           Yes      Yes                                      +---------+---------------+---------+-----------+----------+-------------------+ POP      Full           Yes      Yes                                      +---------+---------------+---------+-----------+----------+-------------------+ PTV      Full                                                             +---------+---------------+---------+-----------+----------+-------------------+  PERO                                                  Difficult to mage                                                         in transverse.                                                            Color flow normal                                                         with augmentationl  +---------+---------------+---------+-----------+----------+-------------------+   Right Technical Findings: Difficult to image the peroneal vein in tranverse. Color floe in longitudinal appears normal on augmention  +----+---------------+---------+-----------+----------+-------+ LEFTCompressibilityPhasicitySpontaneityPropertiesSummary +----+---------------+---------+-----------+----------+-------+ CFV Full           Yes      Yes                          +----+---------------+---------+-----------+----------+-------+ SFJ Full                                                  +----+---------------+---------+-----------+----------+-------+     Summary: Right: There is no evidence of deep vein thrombosis in the lower extremity. However, portions of this examination were limited- see technologist comments above. No cystic structure found in the popliteal fossa. See technical findings listed above Left: There is no evidence of a common femoral vein obstruction  *See table(s) above for measurements and observations.    Preliminary     Procedures Procedures (including critical care time)  Medications Ordered in ED Medications - No data to display   Initial Impression / Assessment and Plan / ED Course  I have reviewed the triage vital signs and the nursing notes.  Pertinent labs & imaging results that were available during my care of the patient were reviewed by me and considered in my medical decision making (see chart for details).        Valerie Terrell is a 85 y.o. female with history of ESRD on HD and recent right hip fracture status post IM nailing on 10/04/2018 presenting to the ED complaining of progressively worsening pain and swelling to the right lower extremity.  Patient has decreased mobility secondary to these symptoms.  On exam, patient has significant swelling of the right lower leg and foot.  BMP significant for elevated creatinine, which is patient's baseline.  CBC shows mild baseline anemia at 11.0.  DVT ultrasound of the right lower extremity was ordered which showed no evidence of acute venous thrombosis.  This study showed patency of the common femoral vein.  On further chart review, patient's orthopedic surgeon had just seen her on 8/5 and felt the patient swelling is due to compression of the femoral vein by an osseous fragment from her fracture.  He feels the patient could possibly benefit from a procedure to remove this heterotopic bone.  He has referred patient to Dr. Magda Bernheim at [facility].  Patient was advised  to follow-up closely with Dr. Redmond Pulling and to either wrap her leg or wear compression stockings and to elevate it as much as possible at home.  Final Clinical Impressions(s) / ED Diagnoses   Final diagnoses:  Right leg swelling    ED Discharge Orders    None       Candie Chroman, MD 12/24/18 OR:[id number]    Pattricia Boss, MD 12/24/18 1434

## 2018-12-23 NOTE — ED Triage Notes (Addendum)
Pt here for RLE swelling that started 4 weeks ago. She is 7 week post op of right hip surgery. Pt states she was seen by MD and also had a DVT study to RLE that was negative. Pt has pain from right hip down to the right leg. PT states she was told she had a "tumor" in between her vein/artery.

## 2018-12-23 NOTE — ED Notes (Signed)
Patient verbalizes understanding of discharge instructions. Opportunity for questioning and answers were provided. Armband removed by staff, pt discharged from ED.  

## 2018-12-23 NOTE — Progress Notes (Signed)
Right lower extremity venous duplex completed. Preliminary results in Chart review CV Proc. Jerick Khachatryan,RVS 12/23/2018,7:20 pm

## 2018-12-24 DIAGNOSIS — D689 Coagulation defect, unspecified: Secondary | ICD-10-CM | POA: Diagnosis not present

## 2018-12-24 DIAGNOSIS — N2581 Secondary hyperparathyroidism of renal origin: Secondary | ICD-10-CM | POA: Diagnosis not present

## 2018-12-24 DIAGNOSIS — Z992 Dependence on renal dialysis: Secondary | ICD-10-CM | POA: Diagnosis not present

## 2018-12-24 DIAGNOSIS — D631 Anemia in chronic kidney disease: Secondary | ICD-10-CM | POA: Diagnosis not present

## 2018-12-24 DIAGNOSIS — E876 Hypokalemia: Secondary | ICD-10-CM | POA: Diagnosis not present

## 2018-12-24 DIAGNOSIS — D509 Iron deficiency anemia, unspecified: Secondary | ICD-10-CM | POA: Diagnosis not present

## 2018-12-24 DIAGNOSIS — N186 End stage renal disease: Secondary | ICD-10-CM | POA: Diagnosis not present

## 2018-12-27 ENCOUNTER — Encounter: Payer: Self-pay | Admitting: Family Medicine

## 2018-12-27 DIAGNOSIS — D509 Iron deficiency anemia, unspecified: Secondary | ICD-10-CM | POA: Diagnosis not present

## 2018-12-27 DIAGNOSIS — E876 Hypokalemia: Secondary | ICD-10-CM | POA: Diagnosis not present

## 2018-12-27 DIAGNOSIS — N186 End stage renal disease: Secondary | ICD-10-CM | POA: Diagnosis not present

## 2018-12-27 DIAGNOSIS — D689 Coagulation defect, unspecified: Secondary | ICD-10-CM | POA: Diagnosis not present

## 2018-12-27 DIAGNOSIS — Z992 Dependence on renal dialysis: Secondary | ICD-10-CM | POA: Diagnosis not present

## 2018-12-27 DIAGNOSIS — D631 Anemia in chronic kidney disease: Secondary | ICD-10-CM | POA: Diagnosis not present

## 2018-12-27 DIAGNOSIS — N2581 Secondary hyperparathyroidism of renal origin: Secondary | ICD-10-CM | POA: Diagnosis not present

## 2018-12-28 ENCOUNTER — Telehealth: Payer: Self-pay

## 2018-12-28 DIAGNOSIS — I130 Hypertensive heart and chronic kidney disease with heart failure and stage 1 through stage 4 chronic kidney disease, or unspecified chronic kidney disease: Secondary | ICD-10-CM | POA: Diagnosis not present

## 2018-12-28 DIAGNOSIS — S72002D Fracture of unspecified part of neck of left femur, subsequent encounter for closed fracture with routine healing: Secondary | ICD-10-CM | POA: Diagnosis not present

## 2018-12-28 DIAGNOSIS — I5042 Chronic combined systolic (congestive) and diastolic (congestive) heart failure: Secondary | ICD-10-CM | POA: Diagnosis not present

## 2018-12-28 DIAGNOSIS — S3210XD Unspecified fracture of sacrum, subsequent encounter for fracture with routine healing: Secondary | ICD-10-CM | POA: Diagnosis not present

## 2018-12-28 DIAGNOSIS — S32509D Unspecified fracture of unspecified pubis, subsequent encounter for fracture with routine healing: Secondary | ICD-10-CM | POA: Diagnosis not present

## 2018-12-28 NOTE — Telephone Encounter (Signed)
Signed and put in box to go up front.  

## 2018-12-28 NOTE — Telephone Encounter (Signed)
Received HH forms for multiple orders. PCP will review and sign, if appropriate.

## 2018-12-29 DIAGNOSIS — Z992 Dependence on renal dialysis: Secondary | ICD-10-CM | POA: Diagnosis not present

## 2018-12-29 DIAGNOSIS — E876 Hypokalemia: Secondary | ICD-10-CM | POA: Diagnosis not present

## 2018-12-29 DIAGNOSIS — N2581 Secondary hyperparathyroidism of renal origin: Secondary | ICD-10-CM | POA: Diagnosis not present

## 2018-12-29 DIAGNOSIS — D689 Coagulation defect, unspecified: Secondary | ICD-10-CM | POA: Diagnosis not present

## 2018-12-29 DIAGNOSIS — M898X9 Other specified disorders of bone, unspecified site: Secondary | ICD-10-CM | POA: Diagnosis not present

## 2018-12-29 DIAGNOSIS — N186 End stage renal disease: Secondary | ICD-10-CM | POA: Diagnosis not present

## 2018-12-29 DIAGNOSIS — D631 Anemia in chronic kidney disease: Secondary | ICD-10-CM | POA: Diagnosis not present

## 2018-12-29 DIAGNOSIS — D509 Iron deficiency anemia, unspecified: Secondary | ICD-10-CM | POA: Diagnosis not present

## 2018-12-29 DIAGNOSIS — M898X5 Other specified disorders of bone, thigh: Secondary | ICD-10-CM | POA: Diagnosis not present

## 2018-12-30 DIAGNOSIS — I5042 Chronic combined systolic (congestive) and diastolic (congestive) heart failure: Secondary | ICD-10-CM | POA: Diagnosis not present

## 2018-12-30 DIAGNOSIS — S32509D Unspecified fracture of unspecified pubis, subsequent encounter for fracture with routine healing: Secondary | ICD-10-CM | POA: Diagnosis not present

## 2018-12-30 DIAGNOSIS — S72002D Fracture of unspecified part of neck of left femur, subsequent encounter for closed fracture with routine healing: Secondary | ICD-10-CM | POA: Diagnosis not present

## 2018-12-30 DIAGNOSIS — S3210XD Unspecified fracture of sacrum, subsequent encounter for fracture with routine healing: Secondary | ICD-10-CM | POA: Diagnosis not present

## 2018-12-30 DIAGNOSIS — I130 Hypertensive heart and chronic kidney disease with heart failure and stage 1 through stage 4 chronic kidney disease, or unspecified chronic kidney disease: Secondary | ICD-10-CM | POA: Diagnosis not present

## 2018-12-31 DIAGNOSIS — N186 End stage renal disease: Secondary | ICD-10-CM | POA: Diagnosis not present

## 2018-12-31 DIAGNOSIS — E876 Hypokalemia: Secondary | ICD-10-CM | POA: Diagnosis not present

## 2018-12-31 DIAGNOSIS — D689 Coagulation defect, unspecified: Secondary | ICD-10-CM | POA: Diagnosis not present

## 2018-12-31 DIAGNOSIS — D631 Anemia in chronic kidney disease: Secondary | ICD-10-CM | POA: Diagnosis not present

## 2018-12-31 DIAGNOSIS — Z992 Dependence on renal dialysis: Secondary | ICD-10-CM | POA: Diagnosis not present

## 2018-12-31 DIAGNOSIS — D509 Iron deficiency anemia, unspecified: Secondary | ICD-10-CM | POA: Diagnosis not present

## 2018-12-31 DIAGNOSIS — N2581 Secondary hyperparathyroidism of renal origin: Secondary | ICD-10-CM | POA: Diagnosis not present

## 2019-01-02 ENCOUNTER — Telehealth: Payer: Self-pay

## 2019-01-02 IMAGING — DX DG CHEST 2V
2 series · 2 of 2 positions shown · non-contrast
Comparison: 01/15/2017, 01/13/2017 and dating back to 01/28/2016.

CLINICAL DATA: Chronic 7+ month history of shortness of breath.
Follow-up CHF and pleural effusions. Current history of COPD,
hypertension and CHF.

EXAM:
CHEST  2 VIEW

[chest lat]
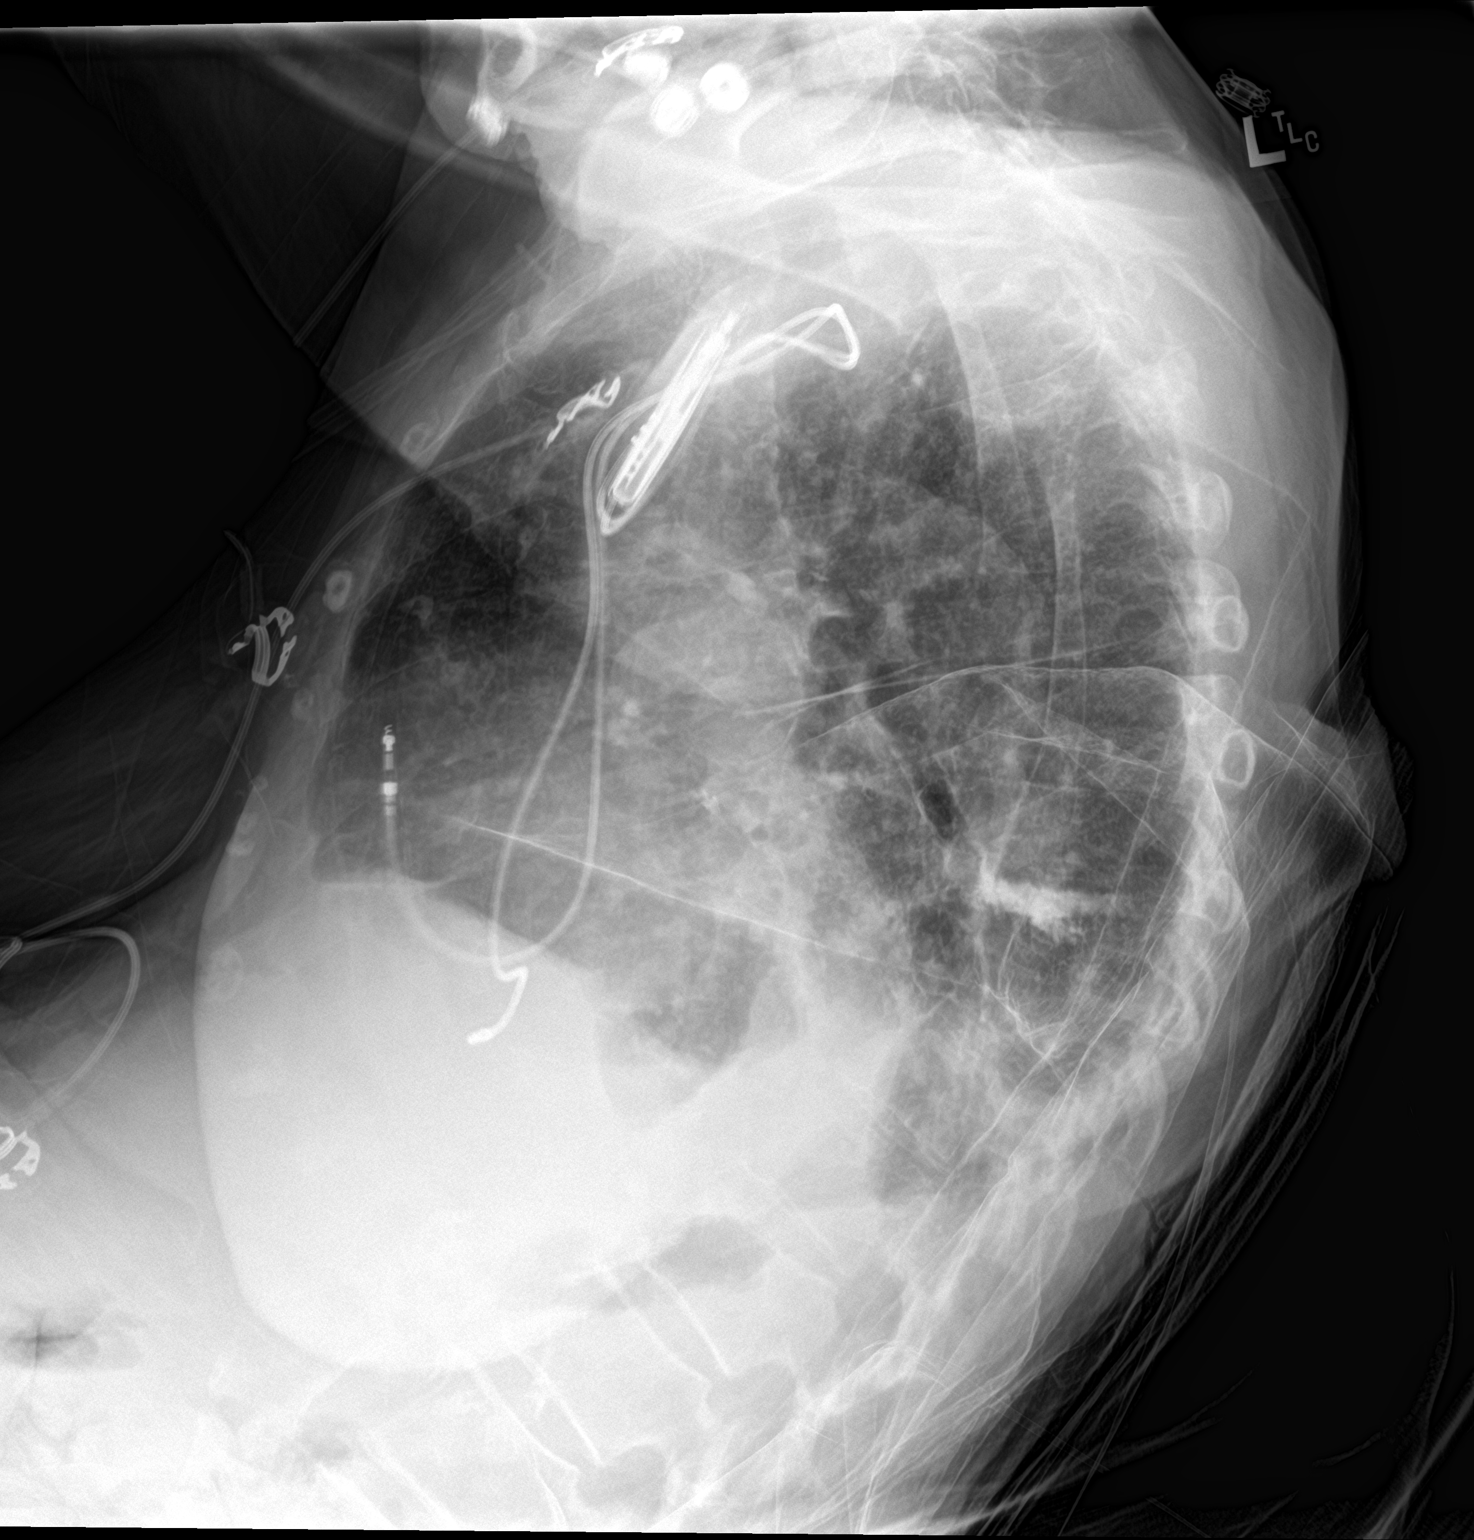

[chest ap]
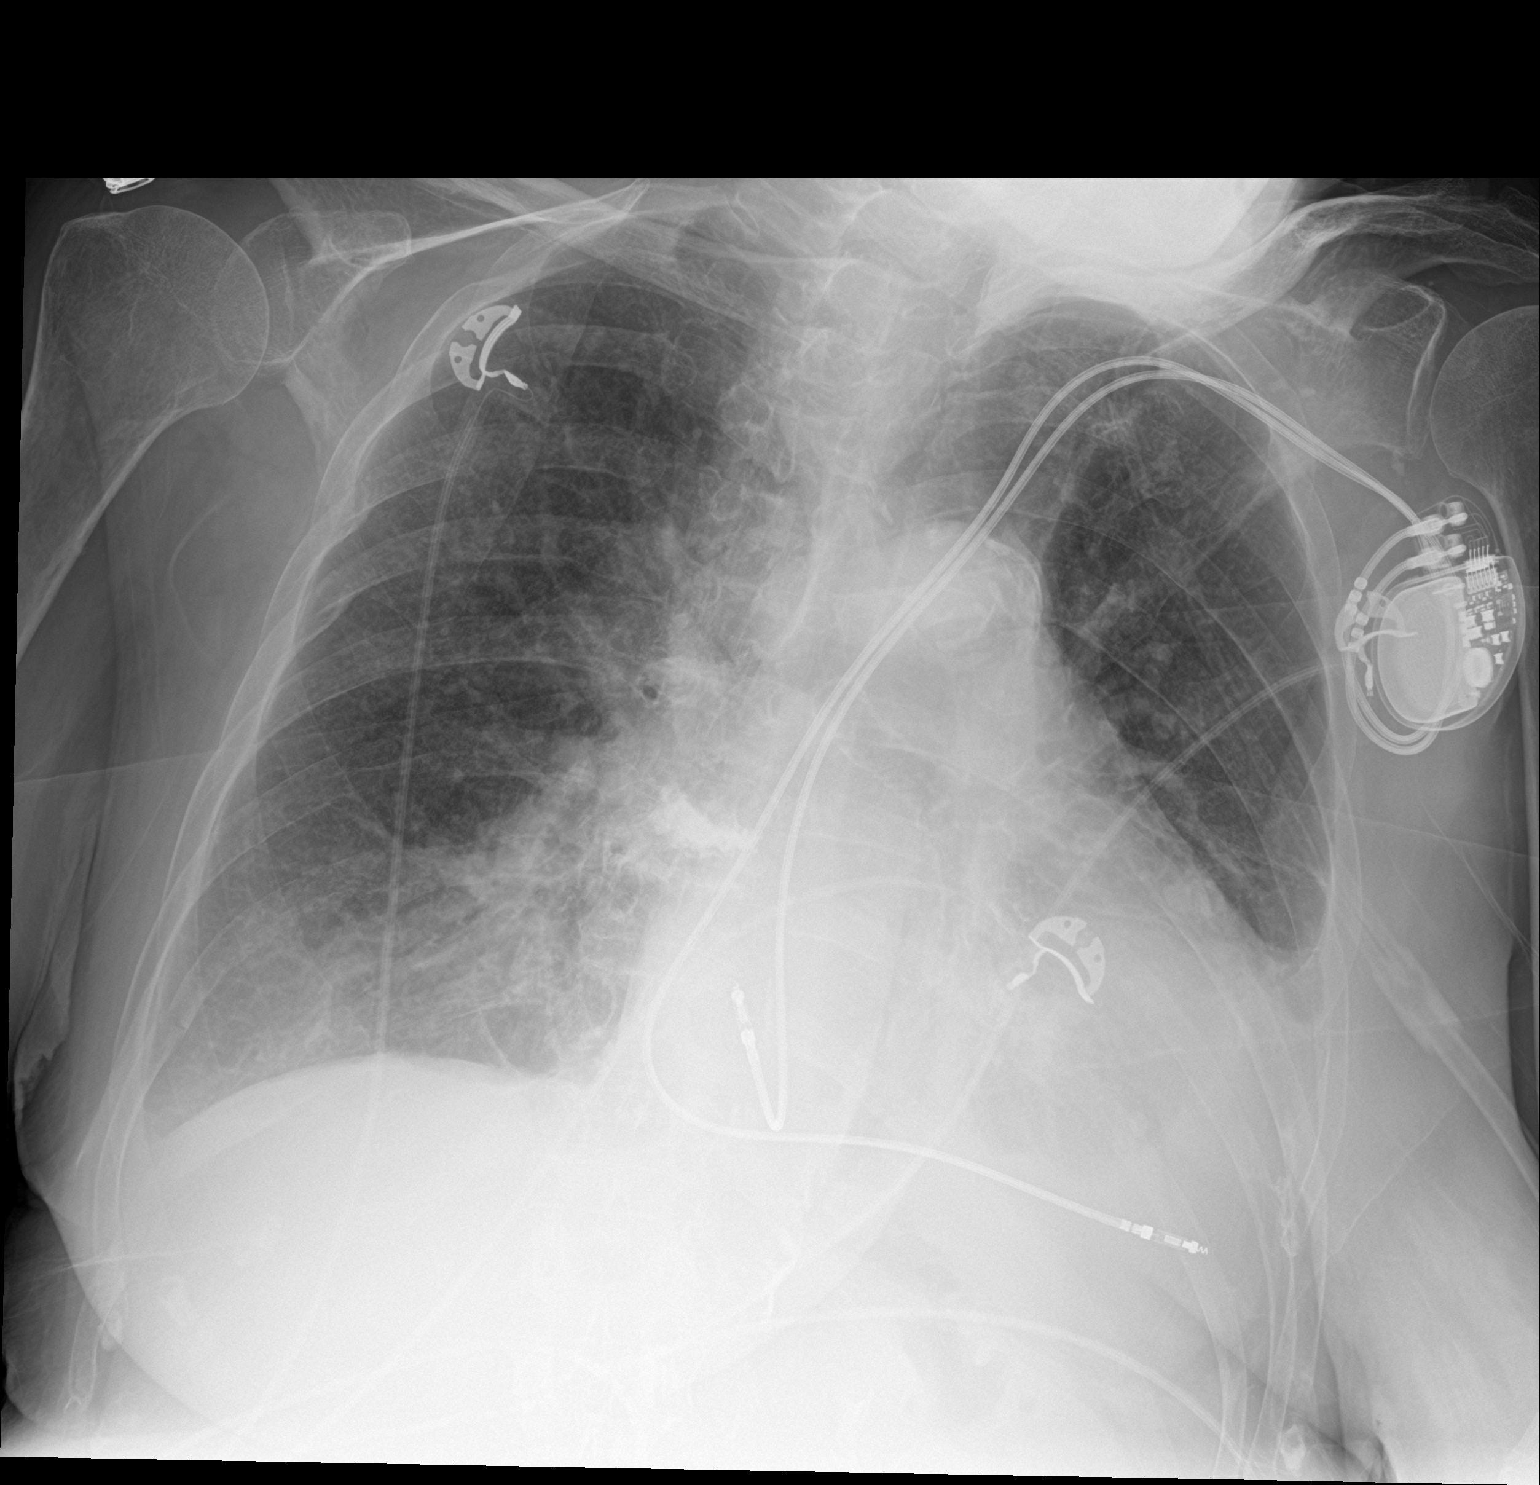

[2 of 2 positions shown; findings below may reference images not displayed]

FINDINGS: AP semi-erect and lateral images were obtained. Cardiac silhouette
markedly enlarged, unchanged. Left subclavian dual lead transvenous
pacemaker unchanged and intact. Thoracic aorta atherosclerotic,
unchanged. Hilar and mediastinal contours otherwise unremarkable.
Interval resolution of interstitial pulmonary edema since yesterday.
Stable bilateral pleural effusions, left greater than right and
associated consolidation in the left lower lobe. No new pulmonary
parenchymal abnormalities. Prior augmentation of a mid thoracic
vertebral body.
IMPRESSION: 1. Resolution of interstitial pulmonary edema since yesterday.
2. Stable bilateral pleural effusions, left greater than right, with
associated passive atelectasis in the left lower lobe.
3. No new abnormalities.

## 2019-01-02 NOTE — Telephone Encounter (Signed)
Received requesting order to extend PT and reassess. PCP will review and sign, if appropriate.

## 2019-01-02 NOTE — Telephone Encounter (Signed)
Signed and put in box to go up front.  

## 2019-01-03 DIAGNOSIS — D509 Iron deficiency anemia, unspecified: Secondary | ICD-10-CM | POA: Diagnosis not present

## 2019-01-03 DIAGNOSIS — Z992 Dependence on renal dialysis: Secondary | ICD-10-CM | POA: Diagnosis not present

## 2019-01-03 DIAGNOSIS — E876 Hypokalemia: Secondary | ICD-10-CM | POA: Diagnosis not present

## 2019-01-03 DIAGNOSIS — N186 End stage renal disease: Secondary | ICD-10-CM | POA: Diagnosis not present

## 2019-01-03 DIAGNOSIS — D631 Anemia in chronic kidney disease: Secondary | ICD-10-CM | POA: Diagnosis not present

## 2019-01-03 DIAGNOSIS — D689 Coagulation defect, unspecified: Secondary | ICD-10-CM | POA: Diagnosis not present

## 2019-01-03 DIAGNOSIS — N2581 Secondary hyperparathyroidism of renal origin: Secondary | ICD-10-CM | POA: Diagnosis not present

## 2019-01-04 DIAGNOSIS — I5042 Chronic combined systolic (congestive) and diastolic (congestive) heart failure: Secondary | ICD-10-CM | POA: Diagnosis not present

## 2019-01-04 DIAGNOSIS — S32509D Unspecified fracture of unspecified pubis, subsequent encounter for fracture with routine healing: Secondary | ICD-10-CM | POA: Diagnosis not present

## 2019-01-04 DIAGNOSIS — I130 Hypertensive heart and chronic kidney disease with heart failure and stage 1 through stage 4 chronic kidney disease, or unspecified chronic kidney disease: Secondary | ICD-10-CM | POA: Diagnosis not present

## 2019-01-04 DIAGNOSIS — S3210XD Unspecified fracture of sacrum, subsequent encounter for fracture with routine healing: Secondary | ICD-10-CM | POA: Diagnosis not present

## 2019-01-04 DIAGNOSIS — S72002D Fracture of unspecified part of neck of left femur, subsequent encounter for closed fracture with routine healing: Secondary | ICD-10-CM | POA: Diagnosis not present

## 2019-01-05 DIAGNOSIS — D689 Coagulation defect, unspecified: Secondary | ICD-10-CM | POA: Diagnosis not present

## 2019-01-05 DIAGNOSIS — D509 Iron deficiency anemia, unspecified: Secondary | ICD-10-CM | POA: Diagnosis not present

## 2019-01-05 DIAGNOSIS — D631 Anemia in chronic kidney disease: Secondary | ICD-10-CM | POA: Diagnosis not present

## 2019-01-05 DIAGNOSIS — N2581 Secondary hyperparathyroidism of renal origin: Secondary | ICD-10-CM | POA: Diagnosis not present

## 2019-01-05 DIAGNOSIS — N186 End stage renal disease: Secondary | ICD-10-CM | POA: Diagnosis not present

## 2019-01-05 DIAGNOSIS — Z992 Dependence on renal dialysis: Secondary | ICD-10-CM | POA: Diagnosis not present

## 2019-01-05 DIAGNOSIS — E876 Hypokalemia: Secondary | ICD-10-CM | POA: Diagnosis not present

## 2019-01-06 DIAGNOSIS — S72002D Fracture of unspecified part of neck of left femur, subsequent encounter for closed fracture with routine healing: Secondary | ICD-10-CM | POA: Diagnosis not present

## 2019-01-06 DIAGNOSIS — S3210XD Unspecified fracture of sacrum, subsequent encounter for fracture with routine healing: Secondary | ICD-10-CM | POA: Diagnosis not present

## 2019-01-06 DIAGNOSIS — S32509D Unspecified fracture of unspecified pubis, subsequent encounter for fracture with routine healing: Secondary | ICD-10-CM | POA: Diagnosis not present

## 2019-01-06 DIAGNOSIS — I5042 Chronic combined systolic (congestive) and diastolic (congestive) heart failure: Secondary | ICD-10-CM | POA: Diagnosis not present

## 2019-01-06 DIAGNOSIS — I130 Hypertensive heart and chronic kidney disease with heart failure and stage 1 through stage 4 chronic kidney disease, or unspecified chronic kidney disease: Secondary | ICD-10-CM | POA: Diagnosis not present

## 2019-01-07 DIAGNOSIS — D509 Iron deficiency anemia, unspecified: Secondary | ICD-10-CM | POA: Diagnosis not present

## 2019-01-07 DIAGNOSIS — Z992 Dependence on renal dialysis: Secondary | ICD-10-CM | POA: Diagnosis not present

## 2019-01-07 DIAGNOSIS — E876 Hypokalemia: Secondary | ICD-10-CM | POA: Diagnosis not present

## 2019-01-07 DIAGNOSIS — D689 Coagulation defect, unspecified: Secondary | ICD-10-CM | POA: Diagnosis not present

## 2019-01-07 DIAGNOSIS — N186 End stage renal disease: Secondary | ICD-10-CM | POA: Diagnosis not present

## 2019-01-07 DIAGNOSIS — N2581 Secondary hyperparathyroidism of renal origin: Secondary | ICD-10-CM | POA: Diagnosis not present

## 2019-01-07 DIAGNOSIS — D631 Anemia in chronic kidney disease: Secondary | ICD-10-CM | POA: Diagnosis not present

## 2019-01-09 DIAGNOSIS — S3210XD Unspecified fracture of sacrum, subsequent encounter for fracture with routine healing: Secondary | ICD-10-CM | POA: Diagnosis not present

## 2019-01-09 DIAGNOSIS — S72002D Fracture of unspecified part of neck of left femur, subsequent encounter for closed fracture with routine healing: Secondary | ICD-10-CM | POA: Diagnosis not present

## 2019-01-09 DIAGNOSIS — I130 Hypertensive heart and chronic kidney disease with heart failure and stage 1 through stage 4 chronic kidney disease, or unspecified chronic kidney disease: Secondary | ICD-10-CM | POA: Diagnosis not present

## 2019-01-09 DIAGNOSIS — I5042 Chronic combined systolic (congestive) and diastolic (congestive) heart failure: Secondary | ICD-10-CM | POA: Diagnosis not present

## 2019-01-09 DIAGNOSIS — S32509D Unspecified fracture of unspecified pubis, subsequent encounter for fracture with routine healing: Secondary | ICD-10-CM | POA: Diagnosis not present

## 2019-01-10 DIAGNOSIS — E876 Hypokalemia: Secondary | ICD-10-CM | POA: Diagnosis not present

## 2019-01-10 DIAGNOSIS — D631 Anemia in chronic kidney disease: Secondary | ICD-10-CM | POA: Diagnosis not present

## 2019-01-10 DIAGNOSIS — D509 Iron deficiency anemia, unspecified: Secondary | ICD-10-CM | POA: Diagnosis not present

## 2019-01-10 DIAGNOSIS — Z992 Dependence on renal dialysis: Secondary | ICD-10-CM | POA: Diagnosis not present

## 2019-01-10 DIAGNOSIS — N2581 Secondary hyperparathyroidism of renal origin: Secondary | ICD-10-CM | POA: Diagnosis not present

## 2019-01-10 DIAGNOSIS — D689 Coagulation defect, unspecified: Secondary | ICD-10-CM | POA: Diagnosis not present

## 2019-01-10 DIAGNOSIS — N186 End stage renal disease: Secondary | ICD-10-CM | POA: Diagnosis not present

## 2019-01-11 ENCOUNTER — Encounter: Payer: Self-pay | Admitting: Family Medicine

## 2019-01-11 DIAGNOSIS — S72121K Displaced fracture of lesser trochanter of right femur, subsequent encounter for closed fracture with nonunion: Secondary | ICD-10-CM

## 2019-01-11 DIAGNOSIS — Z8781 Personal history of (healed) traumatic fracture: Secondary | ICD-10-CM

## 2019-01-11 DIAGNOSIS — Z9889 Other specified postprocedural states: Secondary | ICD-10-CM

## 2019-01-11 DIAGNOSIS — M25551 Pain in right hip: Secondary | ICD-10-CM

## 2019-01-11 DIAGNOSIS — I871 Compression of vein: Secondary | ICD-10-CM

## 2019-01-11 DIAGNOSIS — M7989 Other specified soft tissue disorders: Secondary | ICD-10-CM

## 2019-01-11 NOTE — Telephone Encounter (Signed)
received a message via mychart that Valerie Terrell would like a referral to [facility] to have her leg evauluated. Please advise, thank you. She went to the hospital on 12/23/2018 due to leg swelling.

## 2019-01-11 NOTE — Telephone Encounter (Signed)
The last evaluation from her local orthopedist stated that they were going to arrange for a surgeon at [facility] in [place] to see her.  Did this ever happen?  I need to know before I make a different referral.-thx

## 2019-01-12 DIAGNOSIS — E876 Hypokalemia: Secondary | ICD-10-CM | POA: Diagnosis not present

## 2019-01-12 DIAGNOSIS — N186 End stage renal disease: Secondary | ICD-10-CM | POA: Diagnosis not present

## 2019-01-12 DIAGNOSIS — Z992 Dependence on renal dialysis: Secondary | ICD-10-CM | POA: Diagnosis not present

## 2019-01-12 DIAGNOSIS — D509 Iron deficiency anemia, unspecified: Secondary | ICD-10-CM | POA: Diagnosis not present

## 2019-01-12 DIAGNOSIS — D631 Anemia in chronic kidney disease: Secondary | ICD-10-CM | POA: Diagnosis not present

## 2019-01-12 DIAGNOSIS — N2581 Secondary hyperparathyroidism of renal origin: Secondary | ICD-10-CM | POA: Diagnosis not present

## 2019-01-12 DIAGNOSIS — D689 Coagulation defect, unspecified: Secondary | ICD-10-CM | POA: Diagnosis not present

## 2019-01-12 NOTE — Telephone Encounter (Signed)
I ordered referral to [facility] orthopedics. She saw Dr. Magda Bernheim at [place]. I never received records of this so we'll need to request them (Phone: [phone]; Fax: [phone]). Also, they'll need records from her local orthopedist is Dr. Victorino December.  I never received these records either. -thx

## 2019-01-12 NOTE — Telephone Encounter (Signed)
Valerie Terrell stated that they went to the surgeon and he stated that he thinks she didn't need the surgery and to follow up in a month. Reason why is because bone chips are not causing swelling. Surgeon recommended compression stocking but it did not help, it made the swelling worse. They did recommend a massaging boot but patient declined because her leg is sore to the touch. They have appointment in October with him for a CT and OV. Valerie Terrell stated that she is very frustrated and concerned. They are just wanting another opinion.

## 2019-01-13 ENCOUNTER — Ambulatory Visit (INDEPENDENT_AMBULATORY_CARE_PROVIDER_SITE_OTHER): Payer: Medicare Other | Admitting: Physician Assistant

## 2019-01-13 ENCOUNTER — Encounter: Payer: Self-pay | Admitting: Physician Assistant

## 2019-01-13 ENCOUNTER — Other Ambulatory Visit: Payer: Self-pay

## 2019-01-13 VITALS — BP 124/54 | HR 73 | Ht 59.0 in | Wt 121.0 lb

## 2019-01-13 DIAGNOSIS — Z79899 Other long term (current) drug therapy: Secondary | ICD-10-CM | POA: Diagnosis not present

## 2019-01-13 DIAGNOSIS — I10 Essential (primary) hypertension: Secondary | ICD-10-CM | POA: Diagnosis not present

## 2019-01-13 DIAGNOSIS — I5042 Chronic combined systolic (congestive) and diastolic (congestive) heart failure: Secondary | ICD-10-CM

## 2019-01-13 DIAGNOSIS — I480 Paroxysmal atrial fibrillation: Secondary | ICD-10-CM | POA: Diagnosis not present

## 2019-01-13 DIAGNOSIS — Z950 Presence of cardiac pacemaker: Secondary | ICD-10-CM | POA: Diagnosis not present

## 2019-01-13 NOTE — Patient Instructions (Signed)
Medication Instructions:  Your physician recommends that you continue on your current medications as directed. Please refer to the Current Medication list given to you today.  If you need a refill on your cardiac medications before your next appointment, please call your pharmacy.   Lab work:  TSH   If you have labs (blood work) drawn today and your tests are completely normal, you will receive your results only by: [name] MyChart Message (if you have MyChart) OR . A paper copy in the mail If you have any lab test that is abnormal or we need to change your treatment, we will call you to review the results.  Testing/Procedures: NONE ORDERED  TODAY   Follow-Up: At [facility], you and your health needs are our priority.  As part of our continuing mission to provide you with exceptional heart care, we have created designated Provider Care Teams.  These Care Teams include your primary Cardiologist (physician) and Advanced Practice Providers (APPs -  Physician Assistants and Nurse Practitioners) who all work together to provide you with the care you need, when you need it. You will need a follow up appointment in 1 years.  Please call our office 2 months in advance to schedule this appointment.  You may see Dr. Lovena Le  or one of the following Advanced Practice Providers on your designated Care Team:  Tommye Standard, PA-C  Any Other Special Instructions Will Be Listed Below (If Applicable).

## 2019-01-13 NOTE — Telephone Encounter (Signed)
I know you were getting records from [facility], did you request records from Dr. Victorino December as well?

## 2019-01-13 NOTE — Telephone Encounter (Signed)
Records from [facility] and [facility] have been requested.

## 2019-01-13 NOTE — Progress Notes (Signed)
Cardiology Office Note Date:  01/13/2019  Patient ID:  Valerie Terrell, Valerie Terrell [birth year], MRN [id number] PCP:  Tammi Sou, MD  Cardiologist:  Valerie Terrell Electrophysiologist: Valerie Terrell    Chief Complaint:  Annual PPM check   History of Present Illness: Valerie Terrell is a 85 y.o. female with extensive medical history including ESRF on HD, stroke, HTN, HLD, AFib, symptomatic bradycardia w/PPM, h/o SAH (traumatic on Xarelto), on Eliquis suffered a retinal artery occl and changed to warfarin, functional decline, chronic CHF (systolic), chronic hypoxia.  07/2018 had a fall with injury and no longer felt an a/c candidate.  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  She last saw him in Oct 2018, at that time was routine 69mo post implant visit, noted to have been started on HD only a few weeks prior.  She was maintaining SR and pacer function was intact, no changes were made.   She has subsequently undergone AV Gore-Tex graft RUE on 12/06/17 with Valerie Terrell.  I saw her  July 2019, Cardiac-wise, she had no concerns, no CP, palpitations, no SOB, no dizziness, near syncope or syncope.  She was accompanied by her daughter , they both relay significant concern regarding her post-op course after her AVF surgery Monday.  She was having significant pain, swelling/bruising to the RUE.  She resumed her warfarin Monday and lovenox Tuesday.  A quick INR check today is 1.5, was facilitated f/u with vascular team.    March 2020 she suffered a mechanical fall resulting in a pelvic fracture and large perineal hematoma. Her Coumadin was held in the setting of above with acute blood loss and a Hb of 7.9. Plan was to hold Coumadin for 3 weeks. She was seen by Valerie Branch, NP 08/03/2018.  Was doing well from a cardiac standpoint, though concerned about her a/c and serious fall hx.  Fall with Valerie Terrell 2017 and this one.  In discussion with Valerie Terrell, was decided she was no longer a  warfarin/anticoagulation candidate.  An ER visit 12/23/2018 with ongoing pain and swelling RLE, s/p hip surgery 10/04/2018, intertrochanteric, pertrochanteric, subtrochanteric fracture with IM nail, venous study was done and degative for DVT, ER MD noted her last visit with orthopedices the day prior, who noted "it may be reasonable to consider that the heterotropic bone is compressing the femoral vein. This could be mitigated with excision, however there is always a chance that that vein would have to be ligated if it tears or is involved intimately. This is not something that I would be comfortable doing here. I think she would benefit from the opinion of Valerie Terrell at Valerie Terrell. We will place that referral today" She was recommended to f/u with orthopedics and referral, discharged from the ER. She saw the specialist 8/14, they did not think there was s surgical option, she is pending to see a lymphedema specialist.  HD manages her fluid status   She denies any cardiac concerns.  No CP, palpitations, no SOB.  HD keeps her fluid well managed.  No near syncope or syncope.  She is on 325mg  ASA via the orthopedic surgeon since her surgery in May, and is recommended to make sure they still feel she needs this dose.   Device information: SJM dual chamber PPM, implanted July 2018   Past Medical History:  Diagnosis Date  . Anemia of chronic disease 2018   + anemia of CRI?  [name] Atrophy  of left kidney    with absent blood flow by renal artery dopplers (Valerie Terrell)  . Terrell retinal artery occlusion of left eye 2017  . Chronic combined systolic and diastolic CHF (congestive heart failure) (Valerie Terrell)    a. 11/2016: echo showing EF of 35-40%, RV strain noted, mild MR and mild TR.   Echo showed much improved EF 12/2017 (55-60%)  . Chronic respiratory failure with hypoxia (Valerie Terrell) 02/11/2017   Now on chronic O2 (intermittent as of summer 2019).    . Closed fracture of right superior pubic ramus (Valerie Terrell)  07/18/2018   Non op mgmt (Valerie Terrell was ortho who saw her in hosp but i don't think pt followed up with him).  . Closed right hip fracture (Valerie Terrell) 10/04/2018   ORIF  . Debilitated patient    Valerie Terrell as of 2018.  Hosp bed + full assistance with most ADLs s/p hip fx surg 09/2018.  [name] ESRD on hemodialysis (Valerie Terrell) 01/2017   T/Th/Sat schedule- Valerie Terrell.  Right basilic AV fistula [id number].  Graft 2019.  [name] History of adenomatous polyp of colon   . History of kidney stones   . History of subarachnoid hemorrhage 10/2014   after syncope and while on xarelto  . Hyperlipidemia   . Hypertension    Difficult to control, in the setting of one functioning kidney: pt was referred to nephrology by Valerie Terrell 06/2015.  . Lumbar radiculopathy 2012  . Lumbar spinal stenosis 2019   facet injections only very short term relief.  L1 and L2 selective nerve root blocks helpful 04/2018.  . Lumbar spondylosis    MR 07/2016---no sign of spinal nerve compression or cord compression.  Pt set up with outpt ortho while admitted to hosp 07/2016.  . Malnourished (Valerie Terrell)   . Metatarsal fracture 06/10/2016   Nondisplaced, left 5th metatarsal--pt was referred to ortho  . Osteopenia 2014   T-score -2.1  . PAF (paroxysmal atrial fibrillation) (Valerie Terrell)    Eliquis started after BRAO and CVA.  She was changed to warfarin 2018. All anticoag stopped due to recurrent falls.  . Presence of permanent cardiac pacemaker   . Right rib fracture 12/2016   s/p fall  . Sick sinus syndrome (Valerie Terrell) 11/2016   Dual chamber pacer insertion 2018 (Valerie Terrell)  . Stroke (Valerie Terrell) 0000000   cardioembolic (had CVA while on no anticoag)--"scattered subacute punctate infarcts: 1 in R parietal lobe and 2 in occipital cortex" on MRI br.  CT angio head/neck: aortic arch athero.  R ICA 20% stenosis, L ICA w/out any stenosis.  . Thoracic back pain 01/2017; 01/2018   2018: Facet?  Valerie Terrell->steroid injection.  01/2018-->CT T spine to check for a new comp  fx-->none Terrell.  Selective nerve root inj in T spine helpful on R side.   . Thoracic compression fracture ([place]) 07/2016   T3.  T7 and T8-- T8 kyphoplasty during hosp admission 07/2016.  Neuro referred pt to pain mgmt for consideration of injection 09/2016.  I referred her to endo 08/2016 for consideration of calcitonin treatment.    Past Surgical History:  Procedure Laterality Date  . APPENDECTOMY  child  . AV FISTULA PLACEMENT Right 03/31/2017   Procedure: Right ARM Basilic vein transposition;  Surgeon: Conrad [place], MD;  Location: [facility] OR;  Service: Vascular;  Laterality: Right;  . AV FISTULA PLACEMENT Right 12/06/2017   Procedure: INSERTION OF ARTERIOVENOUS (AV) GORE-TEX GRAFT ARM RIGHT UPPER EXTREMITY;  Surgeon: [name], MD;  Location: [facility]  OR;  Service: Vascular;  Laterality: Right;  . CARDIOVASCULAR STRESS TEST  01/31/2016   Stress myoview: NORMAL/Low risk.  EF 56%.  . [place]  . COLONOSCOPY  2015   + hx of adenomatous polyps.  Need digest health spec in [place] records to see when pt due for next colonoscopy  . EYE SURGERY Bilateral    Catarct  . INSERTION OF DIALYSIS CATHETER Right 01/29/2017   Procedure: INSERTION OF DIALYSIS CATHETER- RIGHT INTERNAL JUGULAR;  Surgeon: [name], MD;  Location: [place];  Service: Vascular;  Laterality: Right;  . INTRAMEDULLARY (IM) NAIL INTERTROCHANTERIC Right 10/04/2018   Procedure: INTRAMEDULLARY (IM) NAIL INTERTROCHANTRIC;  Surgeon: Nicholes Stairs, MD;  Location: [place];  Service: Orthopedics;  Laterality: Right;  . IR GENERIC HISTORICAL  07/24/2016   IR KYPHO THORACIC WITH BONE BIOPSY 07/24/2016 Luanne Bras, MD [facility]-INTERV RAD  . KYPHOPLASTY  07/27/2016   T8  . PACEMAKER IMPLANT N/A 12/03/2016   Procedure: Pacemaker Implant;  Surgeon: Evans Lance, MD;  Location: [place] CV LAB;  Service: Cardiovascular;  Laterality: N/A;  . Renal artery dopplers  02/26/2016   Her right renal dimension was 11 cm pole to pole with  mild to moderate right renal artery stenosis. A right renal aortic ratio was 3.22 suggesting less than a 50% stenosis.  . Right upper arm AV Gore-Tex graft  12/06/2017   Valerie Terrell  . TONSILLECTOMY    . TRANSTHORACIC ECHOCARDIOGRAM  01/29/2016; 11/2016; 12/2017   2017: EF 55-60%, normal LV wall motion, grade I DD.  No cardiac source of emboli was seen.  2018: EF 35-40%, could not assess DD due to a-fib, pulm HTN noted. 12/2017 EF 55-60%, nl wall motion, grd II DD, mod MR and pulm regurg, increased pulm pressure (peak 52).    Current Outpatient Medications  Medication Sig Dispense Refill  . acetaminophen (TYLENOL) 500 MG tablet Take 500-1,000 mg 3 (three) times daily as needed by mouth for moderate pain.     [name] amiodarone (PACERONE) 200 MG tablet TAKE 1 TABLET BY MOUTH  DAILY (Patient taking differently: Take 200 mg by mouth See admin instructions. Take one tablet (200 mg) by mouth on Sunday, Monday, Wednesday, Friday mornings, take one tablet (200 mg) on Tuesday, Thursday, Saturday after dialysis) 90 tablet 2  . aspirin EC 325 MG tablet Take 325 mg by mouth every evening.    [name] b complex-vitamin c-folic acid (NEPHRO-VITE) 0.8 MG TABS tablet Take 1 tablet by mouth See admin instructions. Take one tablet by mouth on Tuesday, Thursday, Saturday - after dialysis    . carvedilol (COREG) 3.125 MG tablet TAKE 1 TABLET BY MOUTH TWO  TIMES DAILY (Patient taking differently: Take 3.125 mg by mouth 2 (two) times daily as needed (SBP >120). ) 180 tablet 1  . docusate sodium (COLACE) 100 MG capsule Take 100 mg by mouth at bedtime.     [name] HYDROcodone-acetaminophen (NORCO/VICODIN) 5-325 MG tablet 1-2 tabs po tid prn pain (Patient taking differently: Take 1 tablet by mouth 3 (three) times daily as needed for moderate pain. ) 120 tablet 0  . isosorbide mononitrate (IMDUR) 60 MG 24 hr tablet TAKE 1 TABLET BY MOUTH  DAILY (Patient taking differently: Take 60 mg by mouth daily. Take one tablet (60 mg) by mouth on Sunday,  Monday, Wednesday, Friday mornings, take one tablet (60 mg) on Tuesday, Thursday, Saturday after dialysis) 90 tablet 0  . lidocaine-prilocaine (EMLA) cream Apply 1 application topically See admin instructions.  Apply small amount to access site (AVF) one hour before dialysis, cover with occlusive dressing (saran wrap)    . Multiple Vitamins-Minerals (MULTIVITAMIN WITH MINERALS) tablet Take 1 tablet by mouth at bedtime.     . ondansetron (ZOFRAN) 4 MG tablet Take 1 tablet (4 mg total) by mouth every 8 (eight) hours as needed for nausea or vomiting. 60 tablet 1  . oxyCODONE (OXY IR/ROXICODONE) 5 MG immediate release tablet 1-2 tabs po q6h prn pain (Patient taking differently: 5-10 mg every 6 (six) hours as needed for severe pain. ) 180 tablet 0  . polyvinyl alcohol (LIQUIFILM TEARS) 1.4 % ophthalmic solution Place 1-2 drops into both eyes daily as needed for dry eyes.     . pravastatin (PRAVACHOL) 40 MG tablet TAKE 1 TABLET BY MOUTH  EVERY EVENING (Patient taking differently: Take 40 mg by mouth at bedtime. ) 90 tablet 1  . sevelamer carbonate (RENVELA) 800 MG tablet Take 800-2,400 mg by mouth See admin instructions. Take two - three tablets (1600 mg - 2400 mg) by mouth up to three times daily with meals, take one tablet (800 mg) with snacks    . vitamin C (ASCORBIC ACID) 500 MG tablet Take 500 mg by mouth daily.    . Vitamin D, Ergocalciferol, (DRISDOL) 1.25 MG (50000 UT) CAPS capsule Take 1 capsule (50,000 Units total) by mouth every Thursday. 12 capsule 3   No current facility-administered medications for this visit.     Allergies:   Clonidine derivatives, Adhesive [tape], Codeine, Nickel, and Sulfa antibiotics   Social History:  The patient  reports that she quit smoking about 46 years ago. She quit after 22.00 years of use. She has never used smokeless tobacco. She reports current alcohol use of about 1.0 standard drinks of alcohol per week. She reports that she does not use drugs.   Family  History:  The patient's family history includes Cancer in her mother; Early death in her father; Heart disease in her father.  ROS:  Please see the history of present illness.    All other systems are reviewed and otherwise negative.   PHYSICAL EXAM:  VS:  BP (!) 124/54   Pulse 73   Ht 4\' 11"  (1.499 m)   Wt 121 lb (54.9 kg)   LMP  (LMP Unknown)   BMI 24.44 kg/m  BMI: Body mass index is 24.44 kg/m. Well nourished, petite, well developed, in no acute distress  HEENT: normocephalic, atraumatic  Neck: no JVD, carotid bruits or masses Cardiac:  RRR; soft SM,  no rubs, or gallops Lungs:  CTA b/l, no wheezing, rhonchi or rales  Abd: soft, nontender MS: no deformity, age appropriate atrophy Ext:  Trace-1+ edema LLE, marked edema RLE, no erythema is appreciated Skin: warm and dry, no rash Neuro:  No gross deficits appreciated Psych: euthymic mood, full affect   PPM site is stable, no tethering or discomfort   EKG:  Not done today PPM interrogation done today and reviewed by myself:  Battery and lead measurements are good No new episodes 3 old episodes most recent Oct 07, 2018 is  AFlutter 8 seconds The other 2 are 1:1 also very short 4 and 6 seconds    Echocardiogram 12/15/2017: Study Conclusions - Left ventricle: The cavity size was normal. Wall thickness was increased in a pattern of mild LVH. Systolic function was normal. The estimated ejection fraction was in the range of 55% to 60%. Wall motion was normal; there were no regional wall motion abnormalities.  Features are consistent with a pseudonormal left ventricular filling pattern, with concomitant abnormal relaxation and increased filling pressure (grade 2 diastolic dysfunction). - Aortic valve: Trileaflet; moderately thickened, moderately calcified leaflets. - Mitral valve: Calcified annulus. There was mild to moderate regurgitation. - Left atrium: The atrium was moderately dilated. - Tricuspid valve:  There was mild regurgitation. - Pulmonic valve: There was moderate regurgitation. - Pulmonary arteries: Systolic pressure was moderately increased. PA peak pressure: 52 mm Hg (S).  Impressions:  - When compared to prior, EF is improved. (prior 35%)   12/02/16: TTE Study Conclusions - Left ventricle: The cavity size was normal. There was moderate   concentric hypertrophy. Systolic function was moderately reduced.   The estimated ejection fraction was in the range of 35% to 40%.   Wall motion was normal; there were no regional wall motion   abnormalities. The study was not technically sufficient to allow   evaluation of LV diastolic dysfunction due to atrial   fibrillation. - Aortic valve: Valve mobility was restricted. Transvalvular   velocity was within the normal range. There was no stenosis.   There was no regurgitation. - Mitral valve: There was mild regurgitation. - Left atrium: The atrium was mildly dilated. - Right ventricle: The cavity size was moderately dilated. Wall   thickness was normal. Systolic function was moderately reduced. - Tricuspid valve: There was mild regurgitation. - Pulmonary arteries: Systolic pressure was moderately increased.   PA peak pressure: 47 mm Hg (S). - Inferior vena cava: The vessel was normal in size. - Pericardium, extracardiac: A trivial pericardial effusion was   identified posterior to the heart. Features were not consistent   with tamponade physiology. Impressions: - When compared to the prior study from 07/14/2016 LVEF has   decreased from 60-65% to 35-40%. RV is moderately dilated with   moderately decreased RVEF. There is RV strain. Consider workup   for pulmonary embolism.   The study acquired while the patient in atrial fibrillation with   RVR.   Recent Labs: 07/18/2018: ALT 9 12/23/2018: BUN 15; Creatinine, Ser 3.57; Hemoglobin 11.0; Platelets 259; Potassium 4.1; Sodium 135  03/28/2018: Cholesterol 194; Direct LDL 98.0; HDL  62.10; Total CHOL/HDL Ratio 3; Triglycerides 210.0; VLDL 42.0   Estimated Creatinine Clearance: 9 mL/min (A) (by C-G formula based on SCr of 3.57 mg/dL (H)).   Wt Readings from Last 3 Encounters:  01/13/19 121 lb (54.9 kg)  10/08/18 126 lb 5.2 oz (57.3 kg)  08/03/18 122 lb 9.6 oz (55.6 kg)     Other studies reviewed: Additional studies/records reviewed today include: summarized above  ASSESSMENT AND PLAN:  1. PPM     Intact function, no programming changes  2. Paroxysmal AFib     CHA2DS2Vasc is 6     Mechanical fall with injury March 2020, no longer felt an a/c candidate     On amiodarone, Afib burden is <1%     LFTs up to date, will check TSH, no pulmonary symptoms   3. HTN     Looks OK, no changes   Disposition:continue q 3 month remots, in clinic in 1 year, sooner if needed     Current medicines are reviewed at length with the patient today.  The patient did not have any concerns regarding medicines.  Valerie Night, PA-C 01/13/2019 12:19 PM     [address] [place] [address] [phone] (office)  [phone] (fax)

## 2019-01-14 DIAGNOSIS — D509 Iron deficiency anemia, unspecified: Secondary | ICD-10-CM | POA: Diagnosis not present

## 2019-01-14 DIAGNOSIS — N2581 Secondary hyperparathyroidism of renal origin: Secondary | ICD-10-CM | POA: Diagnosis not present

## 2019-01-14 DIAGNOSIS — D689 Coagulation defect, unspecified: Secondary | ICD-10-CM | POA: Diagnosis not present

## 2019-01-14 DIAGNOSIS — E876 Hypokalemia: Secondary | ICD-10-CM | POA: Diagnosis not present

## 2019-01-14 DIAGNOSIS — D631 Anemia in chronic kidney disease: Secondary | ICD-10-CM | POA: Diagnosis not present

## 2019-01-14 DIAGNOSIS — Z992 Dependence on renal dialysis: Secondary | ICD-10-CM | POA: Diagnosis not present

## 2019-01-14 DIAGNOSIS — N186 End stage renal disease: Secondary | ICD-10-CM | POA: Diagnosis not present

## 2019-01-14 LAB — TSH: TSH: 2.53 u[IU]/mL (ref 0.450–4.500)

## 2019-01-15 ENCOUNTER — Encounter: Payer: Self-pay | Admitting: Family Medicine

## 2019-01-15 IMAGING — DX DG CHEST 1V PORT
1 series · 1 of 1 positions shown · non-contrast
Comparison: Radiograph January 20, 2017.

CLINICAL DATA: Status post dialysis catheter placement.

EXAM:
PORTABLE CHEST 1 VIEW

[chest ap]
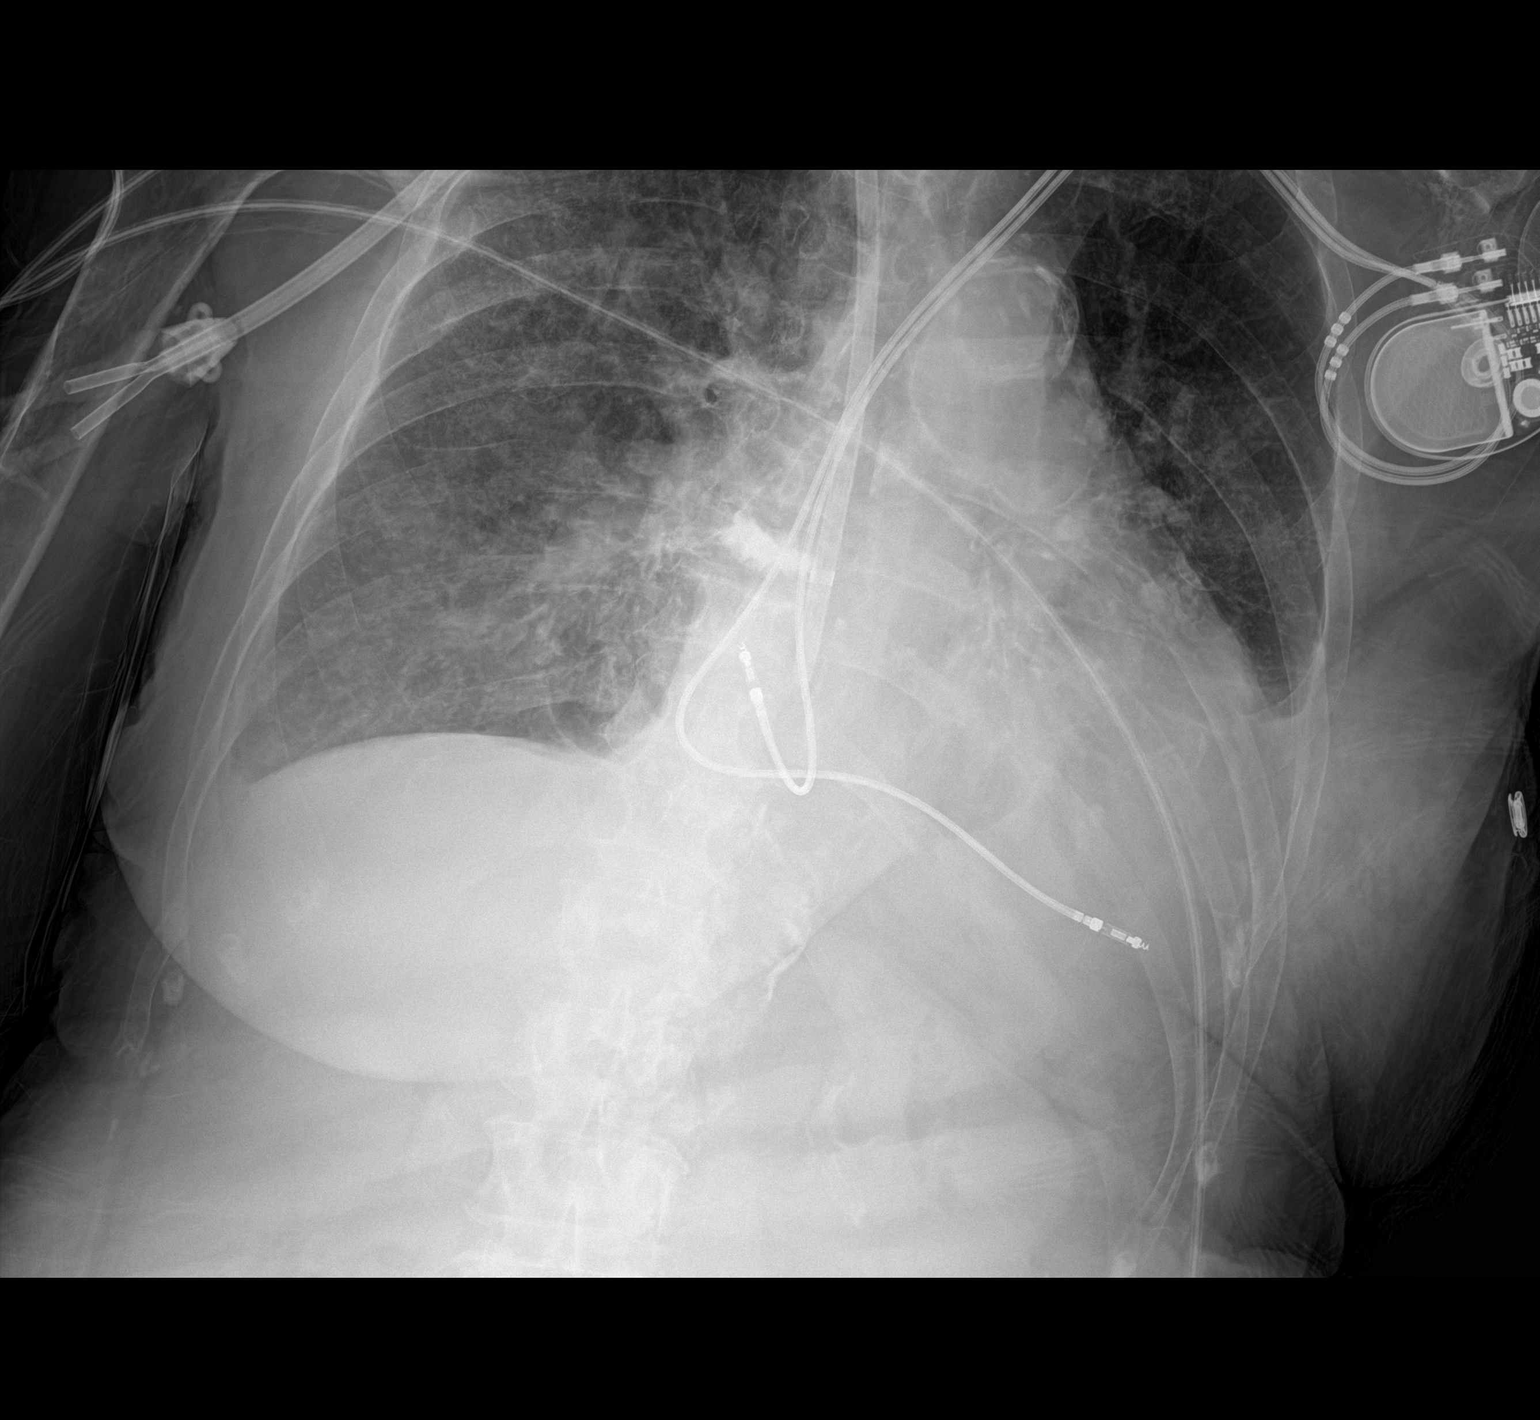

[1 of 1 positions shown; findings below may reference images not displayed]

FINDINGS: Stable cardiomegaly. Atherosclerosis of thoracic aorta is noted.
Mild central pulmonary vascular congestion is noted. No pneumothorax
is noted. Left-sided pacemaker is unchanged in position. Interval
placement of right internal jugular dialysis catheter with distal
tip in expected position of right atrium. Mild diffuse interstitial
densities are noted concerning for pulmonary edema. Mild left
pleural effusion is noted with associated atelectasis. Bony thorax
is unremarkable.
IMPRESSION: Aortic atherosclerosis. Stable cardiomegaly with mild central
pulmonary vascular congestion and bilateral pulmonary edema. Mild
left pleural effusion is noted with associated atelectasis. Interval
placement of right internal jugular dialysis catheter with distal
tip in expected position of right atrium.

## 2019-01-16 DIAGNOSIS — I120 Hypertensive chronic kidney disease with stage 5 chronic kidney disease or end stage renal disease: Secondary | ICD-10-CM | POA: Diagnosis not present

## 2019-01-16 DIAGNOSIS — Z992 Dependence on renal dialysis: Secondary | ICD-10-CM | POA: Diagnosis not present

## 2019-01-16 DIAGNOSIS — N186 End stage renal disease: Secondary | ICD-10-CM | POA: Diagnosis not present

## 2019-01-17 DIAGNOSIS — D689 Coagulation defect, unspecified: Secondary | ICD-10-CM | POA: Diagnosis not present

## 2019-01-17 DIAGNOSIS — E876 Hypokalemia: Secondary | ICD-10-CM | POA: Diagnosis not present

## 2019-01-17 DIAGNOSIS — D631 Anemia in chronic kidney disease: Secondary | ICD-10-CM | POA: Diagnosis not present

## 2019-01-17 DIAGNOSIS — D509 Iron deficiency anemia, unspecified: Secondary | ICD-10-CM | POA: Diagnosis not present

## 2019-01-17 DIAGNOSIS — Z992 Dependence on renal dialysis: Secondary | ICD-10-CM | POA: Diagnosis not present

## 2019-01-17 DIAGNOSIS — N186 End stage renal disease: Secondary | ICD-10-CM | POA: Diagnosis not present

## 2019-01-17 DIAGNOSIS — Z23 Encounter for immunization: Secondary | ICD-10-CM | POA: Diagnosis not present

## 2019-01-17 DIAGNOSIS — N2581 Secondary hyperparathyroidism of renal origin: Secondary | ICD-10-CM | POA: Diagnosis not present

## 2019-01-18 ENCOUNTER — Ambulatory Visit (INDEPENDENT_AMBULATORY_CARE_PROVIDER_SITE_OTHER): Payer: Medicare Other | Admitting: Family Medicine

## 2019-01-18 ENCOUNTER — Other Ambulatory Visit: Payer: Self-pay

## 2019-01-18 ENCOUNTER — Encounter: Payer: Self-pay | Admitting: Family Medicine

## 2019-01-18 VITALS — BP 119/65 | HR 78 | Temp 97.2°F | Resp 16

## 2019-01-18 DIAGNOSIS — M79661 Pain in right lower leg: Secondary | ICD-10-CM | POA: Diagnosis not present

## 2019-01-18 DIAGNOSIS — Z8781 Personal history of (healed) traumatic fracture: Secondary | ICD-10-CM | POA: Diagnosis not present

## 2019-01-18 DIAGNOSIS — G894 Chronic pain syndrome: Secondary | ICD-10-CM | POA: Diagnosis not present

## 2019-01-18 DIAGNOSIS — Z9889 Other specified postprocedural states: Secondary | ICD-10-CM

## 2019-01-18 DIAGNOSIS — I5042 Chronic combined systolic (congestive) and diastolic (congestive) heart failure: Secondary | ICD-10-CM | POA: Diagnosis not present

## 2019-01-18 DIAGNOSIS — M7989 Other specified soft tissue disorders: Secondary | ICD-10-CM

## 2019-01-18 DIAGNOSIS — I872 Venous insufficiency (chronic) (peripheral): Secondary | ICD-10-CM

## 2019-01-18 MED ORDER — ONDANSETRON HCL 4 MG PO TABS: 4.0000 mg | ORAL_TABLET | Freq: Three times a day (TID) | ORAL | 1 refills | Status: DC | PRN

## 2019-01-18 NOTE — Progress Notes (Signed)
CC: right groin and right leg pain f/u  HPI: 85 y/o WF being seen today (accompanied by [name]) for f/u R groin and R leg pain and swelling that has been severe the last couple of months.  She had right hip fracture 10/04/18, got ORIF, noted R groin swelling about 4-6 wks after and then noted R leg swelling and pain. Imaging showed lesser trochanter migrated into anterior soft tissues of R upper thigh, near femoral vein but not compressing the vein on imaging. She has seen 2 orthopedists to see if removal of the bone from soft tissues would help with R leg pain and swelling (? Compression of femoral vein intermittently?), but neither of them thought surgery was necessary.  She recently called to request a 3rd opinion from [facility] and I have ordered this (I chose to refer her to vascular surgery rather than orthopedic surgery). Pt has received VM from them but still need to schedule an appt.  Pain control: seems a bit better than last time, located in R hip, R groin, R leg down into foot. No paresthesias in leg.  Leg feels heavy.  Swelling is worse.  Takes tylenol most of the time, sometimes --once a day avg--takes oxycodone.  Wakes up in night with pain.   ROS: no CP, no SOB, no wheezing, no cough, no dizziness, no HAs, no rashes, no melena/hematochezia.  No polyuria or polydipsia.  No myalgias or arthralgias. Gets some nausea when she takes oxycodone.  Past Medical History:  Diagnosis Date  . Anemia of chronic disease 2018   + anemia of CRI?  [name] Atrophy of left kidney    with absent blood flow by renal artery dopplers (Dr. Gwenlyn Found)  . 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 Chronic combined systolic and diastolic CHF (congestive heart failure) ([place])    a. 11/2016: echo showing EF of 35-40%, RV strain noted, mild MR and mild TR.   Echo showed much improved EF 12/2017 (55-60%)  . Chronic respiratory failure with hypoxia ([place]) 02/11/2017   Now on chronic O2 (intermittent as of  summer 2019).    . Closed fracture of right superior pubic ramus ([place]) 07/18/2018   Non op mgmt (Dr. Victorino December was ortho who saw her in hosp but i don't think pt followed up with him).  . Closed right hip fracture ([place]) 10/04/2018   ORIF  . Debilitated patient    WC dependent as of 2018.  Hosp bed + full assistance with most ADLs s/p hip fx surg 09/2018.  [name] ESRD on hemodialysis ([place]) 01/2017   T/Th/Sat schedule- [place].  Right basilic AV fistula [id number].  Graft 2019.  [name] History of adenomatous polyp of colon   . History of kidney stones   . History of subarachnoid hemorrhage 10/2014   after syncope and while on xarelto  . Hyperlipidemia   . Hypertension    Difficult to control, in the setting of one functioning kidney: pt was referred to nephrology by Dr. Gwenlyn Found 06/2015.  . Lumbar radiculopathy 2012  . Lumbar spinal stenosis 2019   facet injections only very short term relief.  L1 and L2 selective nerve root blocks helpful 04/2018.  . Lumbar spondylosis    MR 07/2016---no sign of spinal nerve compression or cord compression.  Pt set up with outpt ortho while admitted to hosp 07/2016.  . Malnourished ([place])   . Metatarsal fracture 06/10/2016   Nondisplaced, left 5th metatarsal--pt was referred to ortho  .  Osteopenia 2014   T-score -2.1  . PAF (paroxysmal atrial fibrillation) ([facility])    Eliquis started after BRAO and CVA.  She was changed to warfarin 2018. All anticoag stopped due to recurrent falls.  . Presence of permanent cardiac pacemaker   . Right rib fracture 12/2016   s/p fall  . Sick sinus syndrome ([place]) 11/2016   Dual chamber pacer insertion 2018 (Dr. Lovena Le)  . Stroke ([place]) 0000000   cardioembolic (had CVA while on no anticoag)--"scattered subacute punctate infarcts: 1 in R parietal lobe and 2 in occipital cortex" on MRI br.  CT angio head/neck: aortic arch athero.  R ICA 20% stenosis, L ICA w/out any stenosis.  . Thoracic back pain 01/2017; 01/2018   2018: Facet?  Dr.  Ramos->steroid injection.  01/2018-->CT T spine to check for a new comp fx-->none found.  Selective nerve root inj in T spine helpful on R side.   . Thoracic compression fracture ([place]) 07/2016   T3.  T7 and T8-- T8 kyphoplasty during hosp admission 07/2016.  Neuro referred pt to pain mgmt for consideration of injection 09/2016.  I referred her to endo 08/2016 for consideration of calcitonin treatment.    Past Surgical History:  Procedure Laterality Date  . APPENDECTOMY  child  . AV FISTULA PLACEMENT Right 03/31/2017   Procedure: Right ARM Basilic vein transposition;  Surgeon: Conrad [place], MD;  Location: [facility] OR;  Service: Vascular;  Laterality: Right;  . AV FISTULA PLACEMENT Right 12/06/2017   Procedure: INSERTION OF ARTERIOVENOUS (AV) GORE-TEX GRAFT ARM RIGHT UPPER EXTREMITY;  Surgeon: [name], MD;  Location: [facility];  Service: Vascular;  Laterality: Right;  . CARDIOVASCULAR STRESS TEST  01/31/2016   Stress myoview: NORMAL/Low risk.  EF 56%.  . [place]  . COLONOSCOPY  2015   + hx of adenomatous polyps.  Need digest health spec in [facility] records to see when pt due for next colonoscopy  . EYE SURGERY Bilateral    Catarct  . INSERTION OF DIALYSIS CATHETER Right 01/29/2017   Procedure: INSERTION OF DIALYSIS CATHETER- RIGHT INTERNAL JUGULAR;  Surgeon: [name], MD;  Location: [facility];  Service: Vascular;  Laterality: Right;  . INTRAMEDULLARY (IM) NAIL INTERTROCHANTERIC Right 10/04/2018   Procedure: INTRAMEDULLARY (IM) NAIL INTERTROCHANTRIC;  Surgeon: Nicholes Stairs, MD;  Location: [facility];  Service: Orthopedics;  Laterality: Right;  . IR GENERIC HISTORICAL  07/24/2016   IR KYPHO THORACIC WITH BONE BIOPSY 07/24/2016 Luanne Bras, MD [facility]INTER[facility]  . KYPHOPLASTY  07/27/2016   T8  . PACEMAKER IMPLANT N/A 12/03/2016   Procedure: Pacemaker Implant;  Surgeon: Evans Lance, MD;  Location: [facility];  Service: Cardiovascular;  Laterality: N/A;  . Renal artery  dopplers  02/26/2016   Her right renal dimension was 11 cm pole to pole with mild to moderate right renal artery stenosis. A right renal aortic ratio was 3.22 suggesting less than a 50% stenosis.  . Right upper arm AV Gore-Tex graft  12/06/2017   Dr. Donnetta Hutching  . TONSILLECTOMY    . TRANSTHORACIC ECHOCARDIOGRAM  01/29/2016; 11/2016; 12/2017   2017: EF 55-60%, normal LV wall motion, grade I DD.  No cardiac source of emboli was seen.  2018: EF 35-40%, could not assess DD due to a-fib, pulm HTN noted. 12/2017 EF 55-60%, nl wall motion, grd II DD, mod MR and pulm regurg, increased pulm pressure (peak 52).    Family History  Problem Relation Age of Onset  .  Cancer Mother   . Heart disease Father   . Early death Father   . Sudden Cardiac Death Neg Hx     Current Outpatient Medications:  .  acetaminophen (TYLENOL) 500 MG tablet, Take 500-1,000 mg 3 (three) times daily as needed by mouth for moderate pain. , Disp: , Rfl:  .  amiodarone (PACERONE) 200 MG tablet, TAKE 1 TABLET BY MOUTH  DAILY (Patient taking differently: Take 200 mg by mouth See admin instructions. Take one tablet (200 mg) by mouth on Sunday, Monday, Wednesday, Friday mornings, take one tablet (200 mg) on Tuesday, Thursday, Saturday after dialysis), Disp: 90 tablet, Rfl: 2 .  aspirin EC 325 MG tablet, Take 325 mg by mouth every evening., Disp: , Rfl:  .  b complex-vitamin c-folic acid (NEPHRO-VITE) 0.8 MG TABS tablet, Take 1 tablet by mouth See admin instructions. Take one tablet by mouth on Tuesday, Thursday, Saturday - after dialysis, Disp: , Rfl:  .  carvedilol (COREG) 3.125 MG tablet, TAKE 1 TABLET BY MOUTH TWO  TIMES DAILY (Patient taking differently: Take 3.125 mg by mouth 2 (two) times daily as needed (SBP >120). ), Disp: 180 tablet, Rfl: 1 .  docusate sodium (COLACE) 100 MG capsule, Take 100 mg by mouth at bedtime. , Disp: , Rfl:  .  HYDROcodone-acetaminophen (NORCO/VICODIN) 5-325 MG tablet, 1-2 tabs po tid prn pain (Patient taking  differently: Take 1 tablet by mouth 3 (three) times daily as needed for moderate pain. ), Disp: 120 tablet, Rfl: 0 .  isosorbide mononitrate (IMDUR) 60 MG 24 hr tablet, TAKE 1 TABLET BY MOUTH  DAILY (Patient taking differently: Take 60 mg by mouth daily. Take one tablet (60 mg) by mouth on Sunday, Monday, Wednesday, Friday mornings, take one tablet (60 mg) on Tuesday, Thursday, Saturday after dialysis), Disp: 90 tablet, Rfl: 0 .  lidocaine-prilocaine (EMLA) cream, Apply 1 application topically See admin instructions. Apply small amount to access site (AVF) one hour before dialysis, cover with occlusive dressing (saran wrap), Disp: , Rfl:  .  Multiple Vitamins-Minerals (MULTIVITAMIN WITH MINERALS) tablet, Take 1 tablet by mouth at bedtime. , Disp: , Rfl:  .  ondansetron (ZOFRAN) 4 MG tablet, Take 1 tablet (4 mg total) by mouth every 8 (eight) hours as needed for nausea or vomiting., Disp: 60 tablet, Rfl: 1 .  oxyCODONE (OXY IR/ROXICODONE) 5 MG immediate release tablet, 1-2 tabs po q6h prn pain (Patient taking differently: 5-10 mg every 6 (six) hours as needed for severe pain. ), Disp: 180 tablet, Rfl: 0 .  polyvinyl alcohol (LIQUIFILM TEARS) 1.4 % ophthalmic solution, Place 1-2 drops into both eyes daily as needed for dry eyes. , Disp: , Rfl:  .  pravastatin (PRAVACHOL) 40 MG tablet, TAKE 1 TABLET BY MOUTH  EVERY EVENING (Patient taking differently: Take 40 mg by mouth at bedtime. ), Disp: 90 tablet, Rfl: 1 .  sevelamer carbonate (RENVELA) 800 MG tablet, Take 800-2,400 mg by mouth See admin instructions. Take two - three tablets (1600 mg - 2400 mg) by mouth up to three times daily with meals, take one tablet (800 mg) with snacks, Disp: , Rfl:  .  vitamin C (ASCORBIC ACID) 500 MG tablet, Take 500 mg by mouth daily., Disp: , Rfl:  .  Vitamin D, Ergocalciferol, (DRISDOL) 1.25 MG (50000 UT) CAPS capsule, Take 1 capsule (50,000 Units total) by mouth every Thursday., Disp: 12 capsule, Rfl: 3  EXAM: BP 119/65  (BP Location: Left Arm, Patient Position: Sitting, Cuff Size: Normal)   Pulse  78   Temp (!) 97.2 F (36.2 C) (Temporal)   Resp 16   LMP  (LMP Unknown)   SpO2 91%  Gen: Alert, well appearing.  Patient is oriented to person, place, time, and situation. AFFECT: pleasant, lucid thought and speech. R groin with palpable firm mass about 10cm size.  R leg with 3-4+ pitting edema from knee down into top of foot.  Some bruising of R LL but no weeping, rash, or skin breakdown.  Sensation normal.  Foot warm/cap RF normal. L LL with trace pitting edema only.  LABS:     Chemistry      Component Value Date/Time   NA 135 12/23/2018 1410   NA 138 06/23/2018   K 4.1 12/23/2018 1410   CL 91 (L) 12/23/2018 1410   CO2 29 12/23/2018 1410   BUN 15 12/23/2018 1410   BUN 30 (A) 06/23/2018   CREATININE 3.57 (H) 12/23/2018 1410   CREATININE 1.63 (H) 03/24/2016 0110   GLU 128 09/01/2016      Component Value Date/Time   CALCIUM 8.5 (L) 12/23/2018 1410   CALCIUM 9.5 02/16/2017 0232   ALKPHOS 59 07/18/2018 2236   AST 17 07/18/2018 2236   ALT 9 07/18/2018 2236   BILITOT 0.5 07/18/2018 2236   BILITOT 0.2 06/17/2016 0819     Lab Results  Component Value Date   WBC 7.7 12/23/2018   HGB 11.0 (L) 12/23/2018   HCT 36.2 12/23/2018   MCV 97.6 12/23/2018   PLT 259 12/23/2018    ASSESSMENT AND PLAN:  Discussed the following assessment and plan:  1) R leg pain and edema: either has marked asymmetric venous insufficiency/lymphedema or has some venous compression occurring from the lesser trochanter that has migrated to soft tissues of R anterior thigh/groin. Again, 2 ortho MDs gave opinion that no compression occurring and no surgery to remove the piece of bone is indicated. Pain/swelling ongoing and pt and daughter very frustrated. We have decided to seek local vasc MD opinion (hold off on [facility] referral at this time) In the meantime we'll get her set up with [facility] nursing to get [facility] boot. No new meds  today.  Spent 25 min with pt today, with >50% of this time spent in counseling and care coordination regarding the above problems.  An After Visit Summary was printed and given to the patient.  F/u: 1 mo  Signed:  Crissie Sickles, MD           01/19/2019

## 2019-01-19 DIAGNOSIS — Z23 Encounter for immunization: Secondary | ICD-10-CM | POA: Diagnosis not present

## 2019-01-19 DIAGNOSIS — Z992 Dependence on renal dialysis: Secondary | ICD-10-CM | POA: Diagnosis not present

## 2019-01-19 DIAGNOSIS — N2581 Secondary hyperparathyroidism of renal origin: Secondary | ICD-10-CM | POA: Diagnosis not present

## 2019-01-19 DIAGNOSIS — D509 Iron deficiency anemia, unspecified: Secondary | ICD-10-CM | POA: Diagnosis not present

## 2019-01-19 DIAGNOSIS — N186 End stage renal disease: Secondary | ICD-10-CM | POA: Diagnosis not present

## 2019-01-19 DIAGNOSIS — D689 Coagulation defect, unspecified: Secondary | ICD-10-CM | POA: Diagnosis not present

## 2019-01-19 DIAGNOSIS — E876 Hypokalemia: Secondary | ICD-10-CM | POA: Diagnosis not present

## 2019-01-19 DIAGNOSIS — D631 Anemia in chronic kidney disease: Secondary | ICD-10-CM | POA: Diagnosis not present

## 2019-01-20 DIAGNOSIS — N186 End stage renal disease: Secondary | ICD-10-CM | POA: Diagnosis not present

## 2019-01-20 DIAGNOSIS — E8770 Fluid overload, unspecified: Secondary | ICD-10-CM | POA: Diagnosis not present

## 2019-01-20 DIAGNOSIS — Z23 Encounter for immunization: Secondary | ICD-10-CM | POA: Diagnosis not present

## 2019-01-20 DIAGNOSIS — D689 Coagulation defect, unspecified: Secondary | ICD-10-CM | POA: Diagnosis not present

## 2019-01-20 DIAGNOSIS — N2581 Secondary hyperparathyroidism of renal origin: Secondary | ICD-10-CM | POA: Diagnosis not present

## 2019-01-20 DIAGNOSIS — E876 Hypokalemia: Secondary | ICD-10-CM | POA: Diagnosis not present

## 2019-01-20 DIAGNOSIS — Z992 Dependence on renal dialysis: Secondary | ICD-10-CM | POA: Diagnosis not present

## 2019-01-20 DIAGNOSIS — D509 Iron deficiency anemia, unspecified: Secondary | ICD-10-CM | POA: Diagnosis not present

## 2019-01-20 DIAGNOSIS — D631 Anemia in chronic kidney disease: Secondary | ICD-10-CM | POA: Diagnosis not present

## 2019-01-21 DIAGNOSIS — R5381 Other malaise: Secondary | ICD-10-CM | POA: Diagnosis not present

## 2019-01-21 DIAGNOSIS — N2581 Secondary hyperparathyroidism of renal origin: Secondary | ICD-10-CM | POA: Diagnosis not present

## 2019-01-21 DIAGNOSIS — Z992 Dependence on renal dialysis: Secondary | ICD-10-CM | POA: Diagnosis not present

## 2019-01-21 DIAGNOSIS — Z789 Other specified health status: Secondary | ICD-10-CM | POA: Diagnosis not present

## 2019-01-21 DIAGNOSIS — D689 Coagulation defect, unspecified: Secondary | ICD-10-CM | POA: Diagnosis not present

## 2019-01-21 DIAGNOSIS — N186 End stage renal disease: Secondary | ICD-10-CM | POA: Diagnosis not present

## 2019-01-21 DIAGNOSIS — D631 Anemia in chronic kidney disease: Secondary | ICD-10-CM | POA: Diagnosis not present

## 2019-01-21 DIAGNOSIS — Z23 Encounter for immunization: Secondary | ICD-10-CM | POA: Diagnosis not present

## 2019-01-21 DIAGNOSIS — J9601 Acute respiratory failure with hypoxia: Secondary | ICD-10-CM | POA: Diagnosis not present

## 2019-01-21 DIAGNOSIS — J9621 Acute and chronic respiratory failure with hypoxia: Secondary | ICD-10-CM | POA: Diagnosis not present

## 2019-01-21 DIAGNOSIS — E876 Hypokalemia: Secondary | ICD-10-CM | POA: Diagnosis not present

## 2019-01-21 DIAGNOSIS — D509 Iron deficiency anemia, unspecified: Secondary | ICD-10-CM | POA: Diagnosis not present

## 2019-01-24 DIAGNOSIS — D509 Iron deficiency anemia, unspecified: Secondary | ICD-10-CM | POA: Diagnosis not present

## 2019-01-24 DIAGNOSIS — D689 Coagulation defect, unspecified: Secondary | ICD-10-CM | POA: Diagnosis not present

## 2019-01-24 DIAGNOSIS — N2581 Secondary hyperparathyroidism of renal origin: Secondary | ICD-10-CM | POA: Diagnosis not present

## 2019-01-24 DIAGNOSIS — E876 Hypokalemia: Secondary | ICD-10-CM | POA: Diagnosis not present

## 2019-01-24 DIAGNOSIS — N186 End stage renal disease: Secondary | ICD-10-CM | POA: Diagnosis not present

## 2019-01-24 DIAGNOSIS — Z23 Encounter for immunization: Secondary | ICD-10-CM | POA: Diagnosis not present

## 2019-01-24 DIAGNOSIS — D631 Anemia in chronic kidney disease: Secondary | ICD-10-CM | POA: Diagnosis not present

## 2019-01-24 DIAGNOSIS — Z992 Dependence on renal dialysis: Secondary | ICD-10-CM | POA: Diagnosis not present

## 2019-01-25 DIAGNOSIS — S3210XD Unspecified fracture of sacrum, subsequent encounter for fracture with routine healing: Secondary | ICD-10-CM | POA: Diagnosis not present

## 2019-01-25 DIAGNOSIS — I130 Hypertensive heart and chronic kidney disease with heart failure and stage 1 through stage 4 chronic kidney disease, or unspecified chronic kidney disease: Secondary | ICD-10-CM | POA: Diagnosis not present

## 2019-01-25 DIAGNOSIS — I5042 Chronic combined systolic (congestive) and diastolic (congestive) heart failure: Secondary | ICD-10-CM | POA: Diagnosis not present

## 2019-01-25 DIAGNOSIS — S32509D Unspecified fracture of unspecified pubis, subsequent encounter for fracture with routine healing: Secondary | ICD-10-CM | POA: Diagnosis not present

## 2019-01-25 DIAGNOSIS — S72002D Fracture of unspecified part of neck of left femur, subsequent encounter for closed fracture with routine healing: Secondary | ICD-10-CM | POA: Diagnosis not present

## 2019-01-26 DIAGNOSIS — D689 Coagulation defect, unspecified: Secondary | ICD-10-CM | POA: Diagnosis not present

## 2019-01-26 DIAGNOSIS — Z992 Dependence on renal dialysis: Secondary | ICD-10-CM | POA: Diagnosis not present

## 2019-01-26 DIAGNOSIS — E876 Hypokalemia: Secondary | ICD-10-CM | POA: Diagnosis not present

## 2019-01-26 DIAGNOSIS — D631 Anemia in chronic kidney disease: Secondary | ICD-10-CM | POA: Diagnosis not present

## 2019-01-26 DIAGNOSIS — D509 Iron deficiency anemia, unspecified: Secondary | ICD-10-CM | POA: Diagnosis not present

## 2019-01-26 DIAGNOSIS — N2581 Secondary hyperparathyroidism of renal origin: Secondary | ICD-10-CM | POA: Diagnosis not present

## 2019-01-26 DIAGNOSIS — Z23 Encounter for immunization: Secondary | ICD-10-CM | POA: Diagnosis not present

## 2019-01-26 DIAGNOSIS — N186 End stage renal disease: Secondary | ICD-10-CM | POA: Diagnosis not present

## 2019-01-27 DIAGNOSIS — S72002D Fracture of unspecified part of neck of left femur, subsequent encounter for closed fracture with routine healing: Secondary | ICD-10-CM | POA: Diagnosis not present

## 2019-01-27 DIAGNOSIS — S32509D Unspecified fracture of unspecified pubis, subsequent encounter for fracture with routine healing: Secondary | ICD-10-CM | POA: Diagnosis not present

## 2019-01-27 DIAGNOSIS — I5042 Chronic combined systolic (congestive) and diastolic (congestive) heart failure: Secondary | ICD-10-CM | POA: Diagnosis not present

## 2019-01-27 DIAGNOSIS — S3210XD Unspecified fracture of sacrum, subsequent encounter for fracture with routine healing: Secondary | ICD-10-CM | POA: Diagnosis not present

## 2019-01-27 DIAGNOSIS — I130 Hypertensive heart and chronic kidney disease with heart failure and stage 1 through stage 4 chronic kidney disease, or unspecified chronic kidney disease: Secondary | ICD-10-CM | POA: Diagnosis not present

## 2019-01-28 DIAGNOSIS — D509 Iron deficiency anemia, unspecified: Secondary | ICD-10-CM | POA: Diagnosis not present

## 2019-01-28 DIAGNOSIS — D689 Coagulation defect, unspecified: Secondary | ICD-10-CM | POA: Diagnosis not present

## 2019-01-28 DIAGNOSIS — Z992 Dependence on renal dialysis: Secondary | ICD-10-CM | POA: Diagnosis not present

## 2019-01-28 DIAGNOSIS — N186 End stage renal disease: Secondary | ICD-10-CM | POA: Diagnosis not present

## 2019-01-28 DIAGNOSIS — D631 Anemia in chronic kidney disease: Secondary | ICD-10-CM | POA: Diagnosis not present

## 2019-01-28 DIAGNOSIS — Z23 Encounter for immunization: Secondary | ICD-10-CM | POA: Diagnosis not present

## 2019-01-28 DIAGNOSIS — E876 Hypokalemia: Secondary | ICD-10-CM | POA: Diagnosis not present

## 2019-01-28 DIAGNOSIS — N2581 Secondary hyperparathyroidism of renal origin: Secondary | ICD-10-CM | POA: Diagnosis not present

## 2019-01-31 ENCOUNTER — Telehealth: Payer: Self-pay

## 2019-01-31 DIAGNOSIS — N2581 Secondary hyperparathyroidism of renal origin: Secondary | ICD-10-CM | POA: Diagnosis not present

## 2019-01-31 DIAGNOSIS — Z23 Encounter for immunization: Secondary | ICD-10-CM | POA: Diagnosis not present

## 2019-01-31 DIAGNOSIS — D509 Iron deficiency anemia, unspecified: Secondary | ICD-10-CM | POA: Diagnosis not present

## 2019-01-31 DIAGNOSIS — N186 End stage renal disease: Secondary | ICD-10-CM | POA: Diagnosis not present

## 2019-01-31 DIAGNOSIS — Z992 Dependence on renal dialysis: Secondary | ICD-10-CM | POA: Diagnosis not present

## 2019-01-31 DIAGNOSIS — D631 Anemia in chronic kidney disease: Secondary | ICD-10-CM | POA: Diagnosis not present

## 2019-01-31 DIAGNOSIS — D689 Coagulation defect, unspecified: Secondary | ICD-10-CM | POA: Diagnosis not present

## 2019-01-31 DIAGNOSIS — E876 Hypokalemia: Secondary | ICD-10-CM | POA: Diagnosis not present

## 2019-01-31 NOTE — Telephone Encounter (Signed)
Received HH order to extend PT.  PCP will review and sign, if appropriate.

## 2019-02-02 DIAGNOSIS — N186 End stage renal disease: Secondary | ICD-10-CM | POA: Diagnosis not present

## 2019-02-02 DIAGNOSIS — D631 Anemia in chronic kidney disease: Secondary | ICD-10-CM | POA: Diagnosis not present

## 2019-02-02 DIAGNOSIS — D689 Coagulation defect, unspecified: Secondary | ICD-10-CM | POA: Diagnosis not present

## 2019-02-02 DIAGNOSIS — D509 Iron deficiency anemia, unspecified: Secondary | ICD-10-CM | POA: Diagnosis not present

## 2019-02-02 DIAGNOSIS — N2581 Secondary hyperparathyroidism of renal origin: Secondary | ICD-10-CM | POA: Diagnosis not present

## 2019-02-02 DIAGNOSIS — E876 Hypokalemia: Secondary | ICD-10-CM | POA: Diagnosis not present

## 2019-02-02 DIAGNOSIS — Z992 Dependence on renal dialysis: Secondary | ICD-10-CM | POA: Diagnosis not present

## 2019-02-02 DIAGNOSIS — Z23 Encounter for immunization: Secondary | ICD-10-CM | POA: Diagnosis not present

## 2019-02-03 ENCOUNTER — Telehealth: Payer: Self-pay

## 2019-02-03 DIAGNOSIS — S3210XD Unspecified fracture of sacrum, subsequent encounter for fracture with routine healing: Secondary | ICD-10-CM | POA: Diagnosis not present

## 2019-02-03 DIAGNOSIS — I5042 Chronic combined systolic (congestive) and diastolic (congestive) heart failure: Secondary | ICD-10-CM | POA: Diagnosis not present

## 2019-02-03 DIAGNOSIS — S32509D Unspecified fracture of unspecified pubis, subsequent encounter for fracture with routine healing: Secondary | ICD-10-CM | POA: Diagnosis not present

## 2019-02-03 DIAGNOSIS — I130 Hypertensive heart and chronic kidney disease with heart failure and stage 1 through stage 4 chronic kidney disease, or unspecified chronic kidney disease: Secondary | ICD-10-CM | POA: Diagnosis not present

## 2019-02-03 DIAGNOSIS — S72002D Fracture of unspecified part of neck of left femur, subsequent encounter for closed fracture with routine healing: Secondary | ICD-10-CM | POA: Diagnosis not present

## 2019-02-03 NOTE — Telephone Encounter (Signed)
Valerie Terrell can you help with this. I'm an idiot when it comes to DME!-thx

## 2019-02-03 NOTE — Telephone Encounter (Signed)
Received a call from Barth Kirks, occupational therapist with [facility] home health requesting a tub transfer chair for pt.  Please advise, thanks.

## 2019-02-04 DIAGNOSIS — N2581 Secondary hyperparathyroidism of renal origin: Secondary | ICD-10-CM | POA: Diagnosis not present

## 2019-02-04 DIAGNOSIS — E876 Hypokalemia: Secondary | ICD-10-CM | POA: Diagnosis not present

## 2019-02-04 DIAGNOSIS — D509 Iron deficiency anemia, unspecified: Secondary | ICD-10-CM | POA: Diagnosis not present

## 2019-02-04 DIAGNOSIS — Z992 Dependence on renal dialysis: Secondary | ICD-10-CM | POA: Diagnosis not present

## 2019-02-04 DIAGNOSIS — Z23 Encounter for immunization: Secondary | ICD-10-CM | POA: Diagnosis not present

## 2019-02-04 DIAGNOSIS — D689 Coagulation defect, unspecified: Secondary | ICD-10-CM | POA: Diagnosis not present

## 2019-02-04 DIAGNOSIS — N186 End stage renal disease: Secondary | ICD-10-CM | POA: Diagnosis not present

## 2019-02-04 DIAGNOSIS — D631 Anemia in chronic kidney disease: Secondary | ICD-10-CM | POA: Diagnosis not present

## 2019-02-07 DIAGNOSIS — D509 Iron deficiency anemia, unspecified: Secondary | ICD-10-CM | POA: Diagnosis not present

## 2019-02-07 DIAGNOSIS — N186 End stage renal disease: Secondary | ICD-10-CM | POA: Diagnosis not present

## 2019-02-07 DIAGNOSIS — N2581 Secondary hyperparathyroidism of renal origin: Secondary | ICD-10-CM | POA: Diagnosis not present

## 2019-02-07 DIAGNOSIS — D689 Coagulation defect, unspecified: Secondary | ICD-10-CM | POA: Diagnosis not present

## 2019-02-07 DIAGNOSIS — Z23 Encounter for immunization: Secondary | ICD-10-CM | POA: Diagnosis not present

## 2019-02-07 DIAGNOSIS — Z992 Dependence on renal dialysis: Secondary | ICD-10-CM | POA: Diagnosis not present

## 2019-02-07 DIAGNOSIS — D631 Anemia in chronic kidney disease: Secondary | ICD-10-CM | POA: Diagnosis not present

## 2019-02-07 DIAGNOSIS — E876 Hypokalemia: Secondary | ICD-10-CM | POA: Diagnosis not present

## 2019-02-07 NOTE — Telephone Encounter (Signed)
Form was completed and faxed with recent office visit notes.

## 2019-02-08 NOTE — Telephone Encounter (Signed)
Done

## 2019-02-09 DIAGNOSIS — Z23 Encounter for immunization: Secondary | ICD-10-CM | POA: Diagnosis not present

## 2019-02-09 DIAGNOSIS — D631 Anemia in chronic kidney disease: Secondary | ICD-10-CM | POA: Diagnosis not present

## 2019-02-09 DIAGNOSIS — N2581 Secondary hyperparathyroidism of renal origin: Secondary | ICD-10-CM | POA: Diagnosis not present

## 2019-02-09 DIAGNOSIS — N186 End stage renal disease: Secondary | ICD-10-CM | POA: Diagnosis not present

## 2019-02-09 DIAGNOSIS — D509 Iron deficiency anemia, unspecified: Secondary | ICD-10-CM | POA: Diagnosis not present

## 2019-02-09 DIAGNOSIS — Z992 Dependence on renal dialysis: Secondary | ICD-10-CM | POA: Diagnosis not present

## 2019-02-09 DIAGNOSIS — E876 Hypokalemia: Secondary | ICD-10-CM | POA: Diagnosis not present

## 2019-02-09 DIAGNOSIS — D689 Coagulation defect, unspecified: Secondary | ICD-10-CM | POA: Diagnosis not present

## 2019-02-10 DIAGNOSIS — I130 Hypertensive heart and chronic kidney disease with heart failure and stage 1 through stage 4 chronic kidney disease, or unspecified chronic kidney disease: Secondary | ICD-10-CM | POA: Diagnosis not present

## 2019-02-10 DIAGNOSIS — S3210XD Unspecified fracture of sacrum, subsequent encounter for fracture with routine healing: Secondary | ICD-10-CM | POA: Diagnosis not present

## 2019-02-10 DIAGNOSIS — I5042 Chronic combined systolic (congestive) and diastolic (congestive) heart failure: Secondary | ICD-10-CM | POA: Diagnosis not present

## 2019-02-10 DIAGNOSIS — S72002D Fracture of unspecified part of neck of left femur, subsequent encounter for closed fracture with routine healing: Secondary | ICD-10-CM | POA: Diagnosis not present

## 2019-02-10 DIAGNOSIS — S32509D Unspecified fracture of unspecified pubis, subsequent encounter for fracture with routine healing: Secondary | ICD-10-CM | POA: Diagnosis not present

## 2019-02-11 DIAGNOSIS — D631 Anemia in chronic kidney disease: Secondary | ICD-10-CM | POA: Diagnosis not present

## 2019-02-11 DIAGNOSIS — D509 Iron deficiency anemia, unspecified: Secondary | ICD-10-CM | POA: Diagnosis not present

## 2019-02-11 DIAGNOSIS — D689 Coagulation defect, unspecified: Secondary | ICD-10-CM | POA: Diagnosis not present

## 2019-02-11 DIAGNOSIS — E876 Hypokalemia: Secondary | ICD-10-CM | POA: Diagnosis not present

## 2019-02-11 DIAGNOSIS — Z992 Dependence on renal dialysis: Secondary | ICD-10-CM | POA: Diagnosis not present

## 2019-02-11 DIAGNOSIS — N186 End stage renal disease: Secondary | ICD-10-CM | POA: Diagnosis not present

## 2019-02-11 DIAGNOSIS — Z23 Encounter for immunization: Secondary | ICD-10-CM | POA: Diagnosis not present

## 2019-02-11 DIAGNOSIS — N2581 Secondary hyperparathyroidism of renal origin: Secondary | ICD-10-CM | POA: Diagnosis not present

## 2019-02-13 ENCOUNTER — Encounter: Payer: Self-pay | Admitting: Family Medicine

## 2019-02-14 DIAGNOSIS — D689 Coagulation defect, unspecified: Secondary | ICD-10-CM | POA: Diagnosis not present

## 2019-02-14 DIAGNOSIS — E876 Hypokalemia: Secondary | ICD-10-CM | POA: Diagnosis not present

## 2019-02-14 DIAGNOSIS — D509 Iron deficiency anemia, unspecified: Secondary | ICD-10-CM | POA: Diagnosis not present

## 2019-02-14 DIAGNOSIS — Z992 Dependence on renal dialysis: Secondary | ICD-10-CM | POA: Diagnosis not present

## 2019-02-14 DIAGNOSIS — Z23 Encounter for immunization: Secondary | ICD-10-CM | POA: Diagnosis not present

## 2019-02-14 DIAGNOSIS — N2581 Secondary hyperparathyroidism of renal origin: Secondary | ICD-10-CM | POA: Diagnosis not present

## 2019-02-14 DIAGNOSIS — N186 End stage renal disease: Secondary | ICD-10-CM | POA: Diagnosis not present

## 2019-02-14 DIAGNOSIS — D631 Anemia in chronic kidney disease: Secondary | ICD-10-CM | POA: Diagnosis not present

## 2019-02-15 DIAGNOSIS — Z992 Dependence on renal dialysis: Secondary | ICD-10-CM | POA: Diagnosis not present

## 2019-02-15 DIAGNOSIS — N186 End stage renal disease: Secondary | ICD-10-CM | POA: Diagnosis not present

## 2019-02-15 DIAGNOSIS — I120 Hypertensive chronic kidney disease with stage 5 chronic kidney disease or end stage renal disease: Secondary | ICD-10-CM | POA: Diagnosis not present

## 2019-02-16 DIAGNOSIS — Z992 Dependence on renal dialysis: Secondary | ICD-10-CM | POA: Diagnosis not present

## 2019-02-16 DIAGNOSIS — D689 Coagulation defect, unspecified: Secondary | ICD-10-CM | POA: Diagnosis not present

## 2019-02-16 DIAGNOSIS — D509 Iron deficiency anemia, unspecified: Secondary | ICD-10-CM | POA: Diagnosis not present

## 2019-02-16 DIAGNOSIS — E876 Hypokalemia: Secondary | ICD-10-CM | POA: Diagnosis not present

## 2019-02-16 DIAGNOSIS — N186 End stage renal disease: Secondary | ICD-10-CM | POA: Diagnosis not present

## 2019-02-16 DIAGNOSIS — N2581 Secondary hyperparathyroidism of renal origin: Secondary | ICD-10-CM | POA: Diagnosis not present

## 2019-02-17 ENCOUNTER — Encounter: Payer: Self-pay | Admitting: Family Medicine

## 2019-02-17 DIAGNOSIS — G894 Chronic pain syndrome: Secondary | ICD-10-CM | POA: Diagnosis not present

## 2019-02-17 DIAGNOSIS — I5042 Chronic combined systolic (congestive) and diastolic (congestive) heart failure: Secondary | ICD-10-CM | POA: Diagnosis not present

## 2019-02-17 NOTE — Telephone Encounter (Signed)
LM with Melissa Stenson/Kathy Cheek at [phone] to determine status of tub transfer chair request.

## 2019-02-18 DIAGNOSIS — D509 Iron deficiency anemia, unspecified: Secondary | ICD-10-CM | POA: Diagnosis not present

## 2019-02-18 DIAGNOSIS — Z992 Dependence on renal dialysis: Secondary | ICD-10-CM | POA: Diagnosis not present

## 2019-02-18 DIAGNOSIS — N2581 Secondary hyperparathyroidism of renal origin: Secondary | ICD-10-CM | POA: Diagnosis not present

## 2019-02-18 DIAGNOSIS — D689 Coagulation defect, unspecified: Secondary | ICD-10-CM | POA: Diagnosis not present

## 2019-02-18 DIAGNOSIS — N186 End stage renal disease: Secondary | ICD-10-CM | POA: Diagnosis not present

## 2019-02-18 DIAGNOSIS — E876 Hypokalemia: Secondary | ICD-10-CM | POA: Diagnosis not present

## 2019-02-20 DIAGNOSIS — R5381 Other malaise: Secondary | ICD-10-CM | POA: Diagnosis not present

## 2019-02-20 DIAGNOSIS — Z789 Other specified health status: Secondary | ICD-10-CM | POA: Diagnosis not present

## 2019-02-20 DIAGNOSIS — J9601 Acute respiratory failure with hypoxia: Secondary | ICD-10-CM | POA: Diagnosis not present

## 2019-02-20 DIAGNOSIS — J9621 Acute and chronic respiratory failure with hypoxia: Secondary | ICD-10-CM | POA: Diagnosis not present

## 2019-02-21 ENCOUNTER — Encounter (HOSPITAL_COMMUNITY): Payer: Medicare Other

## 2019-02-21 ENCOUNTER — Encounter: Payer: Medicare Other | Admitting: Vascular Surgery

## 2019-02-21 DIAGNOSIS — Z992 Dependence on renal dialysis: Secondary | ICD-10-CM | POA: Diagnosis not present

## 2019-02-21 DIAGNOSIS — D509 Iron deficiency anemia, unspecified: Secondary | ICD-10-CM | POA: Diagnosis not present

## 2019-02-21 DIAGNOSIS — N186 End stage renal disease: Secondary | ICD-10-CM | POA: Diagnosis not present

## 2019-02-21 DIAGNOSIS — E876 Hypokalemia: Secondary | ICD-10-CM | POA: Diagnosis not present

## 2019-02-21 DIAGNOSIS — D689 Coagulation defect, unspecified: Secondary | ICD-10-CM | POA: Diagnosis not present

## 2019-02-21 DIAGNOSIS — N2581 Secondary hyperparathyroidism of renal origin: Secondary | ICD-10-CM | POA: Diagnosis not present

## 2019-02-22 ENCOUNTER — Other Ambulatory Visit: Payer: Self-pay

## 2019-02-22 ENCOUNTER — Ambulatory Visit (INDEPENDENT_AMBULATORY_CARE_PROVIDER_SITE_OTHER): Payer: Medicare Other | Admitting: Family Medicine

## 2019-02-22 ENCOUNTER — Encounter: Payer: Self-pay | Admitting: Family Medicine

## 2019-02-22 VITALS — BP 126/71 | HR 80 | Temp 98.1°F | Resp 16 | Ht 59.0 in

## 2019-02-22 DIAGNOSIS — M79661 Pain in right lower leg: Secondary | ICD-10-CM

## 2019-02-22 DIAGNOSIS — M7989 Other specified soft tissue disorders: Secondary | ICD-10-CM

## 2019-02-22 DIAGNOSIS — R0902 Hypoxemia: Secondary | ICD-10-CM | POA: Diagnosis not present

## 2019-02-22 NOTE — Progress Notes (Signed)
OFFICE VISIT  02/22/2019   CC:  Chief Complaint  Patient presents with  . Follow-up    R leg swelling     HPI:    [name] who presents accompanied by [name] for 1 month f/u R leg swelling. Pt has had several months of R groin and R leg pain and swelling that has been severe the last couple of months.  She had right hip fracture 10/04/18, got ORIF, noted R groin swelling about 4-6 wks after and then noted R leg swelling and pain. Imaging showed lesser trochanter migrated into anterior soft tissues of R upper thigh, near femoral vein but not compressing the vein on imaging. She has seen 2 orthopedists to see if removal of the bone from soft tissues would help with R leg pain and swelling (? Compression of femoral vein intermittently?), but neither of them thought surgery was necessary.  She recently called to request a 3rd opinion from [place] and I have ordered this (I chose to refer her to vascular surgery rather than orthopedic surgery).  Last visit we chose to ask for opinion of a local vasc surgeon rather than wait on the [facility] eval. Also tried to arrange with [facility] for pt to get [facility] boot.  No new meds rx'd last visit.  Interim hx: Doing better regarding R leg swelling!  The pt cancelled her specialist referrals.   R leg swelling improved with more aggressive fluid removal with HD--she got an extra treatment. Pt ended up declining a trial of [facility] boot. She says she has minimal pain in the leg at all.  Some intermittent R hip area pain when she does too much ER/IR movements.  Also some LBP from sitting in WC too much.  Uses 1 oxycodone per day usually.  Not using vicodin at all.  Of note, her most recent venous doppler u/s on R leg was 12/09/18.  She continues to take ASA 325mg  qd.  She has PAF and is on amiodarone and coreg.  Not on DOAC or coumadin due to recurrent falls, risk>benefit.  ROS: no CP, no SOB, no wheezing, no cough, no dizziness,  no HAs, no rashes, no melena/hematochezia.  No polyuria or polydipsia.  No myalgias or arthralgias. She has not been checking her oxygen sat at home any lately.  In the last few months when she has checked it the range has been 90-96 % on room air.  She has oxygen at home but does not use it.   Past Medical History:  Diagnosis Date  . Anemia of chronic disease 2018   + anemia of CRI?  [name] Atrophy of left kidney    with absent blood flow by renal artery dopplers (Dr. Gwenlyn Found)  . 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 Chronic combined systolic and diastolic CHF (congestive heart failure) ([place])    a. 11/2016: echo showing EF of 35-40%, RV strain noted, mild MR and mild TR.   Echo showed much improved EF 12/2017 (55-60%)  . Chronic respiratory failure with hypoxia ([place]) 02/11/2017   Now on chronic O2 (intermittent as of summer 2019).    . Closed fracture of right superior pubic ramus ([place]) 07/18/2018   Non op mgmt (Dr. Victorino December was ortho who saw her in hosp but i don't think pt followed up with him).  . Closed right hip fracture ([place]) 10/04/2018   ORIF  . Debilitated patient    WC dependent as  of 2018.  Hosp bed + full assistance with most ADLs s/p hip fx surg 09/2018.  [name] ESRD on hemodialysis ([place]) 01/2017   T/Th/Sat schedule- [facility].  Right basilic AV fistula [id number].  Graft 2019.  [name] History of adenomatous polyp of colon   . History of kidney stones   . History of subarachnoid hemorrhage 10/2014   after syncope and while on xarelto  . Hyperlipidemia   . Hypertension    Difficult to control, in the setting of one functioning kidney: pt was referred to nephrology by Dr. Gwenlyn Found 06/2015.  . Lumbar radiculopathy 2012  . Lumbar spinal stenosis 2019   facet injections only very short term relief.  L1 and L2 selective nerve root blocks helpful 04/2018.  . Lumbar spondylosis    MR 07/2016---no sign of spinal nerve compression or cord compression.  Pt set up with outpt ortho  while admitted to hosp 07/2016.  . Malnourished ([place])   . Metatarsal fracture 06/10/2016   Nondisplaced, left 5th metatarsal--pt was referred to ortho  . Osteopenia 2014   T-score -2.1  . PAF (paroxysmal atrial fibrillation) ([facility])    Eliquis started after BRAO and CVA.  She was changed to warfarin 2018. All anticoag stopped due to recurrent falls.  . Presence of permanent cardiac pacemaker   . Right rib fracture 12/2016   s/p fall  . Sick sinus syndrome ([place]) 11/2016   Dual chamber pacer insertion 2018 (Dr. Lovena Le)  . Stroke ([place]) 0000000   cardioembolic (had CVA while on no anticoag)--"scattered subacute punctate infarcts: 1 in R parietal lobe and 2 in occipital cortex" on MRI br.  CT angio head/neck: aortic arch athero.  R ICA 20% stenosis, L ICA w/out any stenosis.  . Thoracic back pain 01/2017; 01/2018   2018: Facet?  Dr. Ramos->steroid injection.  01/2018-->CT T spine to check for a new comp fx-->none found.  Selective nerve root inj in T spine helpful on R side.   . Thoracic compression fracture ([place]) 07/2016   T3.  T7 and T8-- T8 kyphoplasty during hosp admission 07/2016.  Neuro referred pt to pain mgmt for consideration of injection 09/2016.  I referred her to endo 08/2016 for consideration of calcitonin treatment.    Past Surgical History:  Procedure Laterality Date  . APPENDECTOMY  child  . AV FISTULA PLACEMENT Right 03/31/2017   Procedure: Right ARM Basilic vein transposition;  Surgeon: Conrad Big Creek, MD;  Location: [facility] OR;  Service: Vascular;  Laterality: Right;  . AV FISTULA PLACEMENT Right 12/06/2017   Procedure: INSERTION OF ARTERIOVENOUS (AV) GORE-TEX GRAFT ARM RIGHT UPPER EXTREMITY;  Surgeon: [name], MD;  Location: [place];  Service: Vascular;  Laterality: Right;  . CARDIOVASCULAR STRESS TEST  01/31/2016   Stress myoview: NORMAL/Low risk.  EF 56%.  . [place]  . COLONOSCOPY  2015   + hx of adenomatous polyps.  Need digest health spec in [place] records to  see when pt due for next colonoscopy  . EYE SURGERY Bilateral    Catarct  . INSERTION OF DIALYSIS CATHETER Right 01/29/2017   Procedure: INSERTION OF DIALYSIS CATHETER- RIGHT INTERNAL JUGULAR;  Surgeon: [name], MD;  Location: [facility];  Service: Vascular;  Laterality: Right;  . INTRAMEDULLARY (IM) NAIL INTERTROCHANTERIC Right 10/04/2018   Procedure: INTRAMEDULLARY (IM) NAIL INTERTROCHANTRIC;  Surgeon: Nicholes Stairs, MD;  [facility];  Service: Orthopedics;  Laterality: Right;  . IR GENERIC HISTORICAL  07/24/2016   IR KYPHO THORACIC  WITH BONE BIOPSY 07/24/2016 Luanne Bras, MD [facility]-INTER[facility]  . KYPHOPLASTY  07/27/2016   T8  . PACEMAKER IMPLANT N/A 12/03/2016   Procedure: Pacemaker Implant;  Surgeon: Evans Lance, MD;  Location: [facility] CV LAB;  Service: Cardiovascular;  Laterality: N/A;  . Renal artery dopplers  02/26/2016   Her right renal dimension was 11 cm pole to pole with mild to moderate right renal artery stenosis. A right renal aortic ratio was 3.22 suggesting less than a 50% stenosis.  . Right upper arm AV Gore-Tex graft  12/06/2017   Dr. Donnetta Hutching  . TONSILLECTOMY    . TRANSTHORACIC ECHOCARDIOGRAM  01/29/2016; 11/2016; 12/2017   2017: EF 55-60%, normal LV wall motion, grade I DD.  No cardiac source of emboli was seen.  2018: EF 35-40%, could not assess DD due to a-fib, pulm HTN noted. 12/2017 EF 55-60%, nl wall motion, grd II DD, mod MR and pulm regurg, increased pulm pressure (peak 52).    Outpatient Medications Prior to Visit  Medication Sig Dispense Refill  . acetaminophen (TYLENOL) 500 MG tablet Take 500-1,000 mg 3 (three) times daily as needed by mouth for moderate pain.     [name] amiodarone (PACERONE) 200 MG tablet TAKE 1 TABLET BY MOUTH  DAILY (Patient taking differently: Take 200 mg by mouth See admin instructions. Take one tablet (200 mg) by mouth on Sunday, Monday, Wednesday, Friday mornings, take one tablet (200 mg) on Tuesday, Thursday, Saturday after dialysis)  90 tablet 2  . aspirin EC 325 MG tablet Take 325 mg by mouth every evening.    [name] b complex-vitamin c-folic acid (NEPHRO-VITE) 0.8 MG TABS tablet Take 1 tablet by mouth See admin instructions. Take one tablet by mouth on Tuesday, Thursday, Saturday - after dialysis    . carvedilol (COREG) 3.125 MG tablet TAKE 1 TABLET BY MOUTH TWO  TIMES DAILY (Patient taking differently: Take 3.125 mg by mouth 2 (two) times daily as needed (SBP >120). ) 180 tablet 1  . docusate sodium (COLACE) 100 MG capsule Take 100 mg by mouth at bedtime.     . heparin 1000 UNIT/ML injection Heparin Sodium (Porcine) 1,000 Units/mL Systemic    . isosorbide mononitrate (IMDUR) 60 MG 24 hr tablet TAKE 1 TABLET BY MOUTH  DAILY (Patient taking differently: Take 60 mg by mouth daily. Take one tablet (60 mg) by mouth on Sunday, Monday, Wednesday, Friday mornings, take one tablet (60 mg) on Tuesday, Thursday, Saturday after dialysis) 90 tablet 0  . lidocaine-prilocaine (EMLA) cream Apply 1 application topically See admin instructions. Apply small amount to access site (AVF) one hour before dialysis, cover with occlusive dressing (saran wrap)    . Multiple Vitamins-Minerals (MULTIVITAMIN WITH MINERALS) tablet Take 1 tablet by mouth at bedtime.     . ondansetron (ZOFRAN) 4 MG tablet Take 1 tablet (4 mg total) by mouth every 8 (eight) hours as needed for nausea or vomiting. 60 tablet 1  . oxyCODONE (OXY IR/ROXICODONE) 5 MG immediate release tablet 1-2 tabs po q6h prn pain (Patient taking differently: 5-10 mg every 6 (six) hours as needed for severe pain. ) 180 tablet 0  . polyvinyl alcohol (LIQUIFILM TEARS) 1.4 % ophthalmic solution Place 1-2 drops into both eyes daily as needed for dry eyes.     . pravastatin (PRAVACHOL) 40 MG tablet TAKE 1 TABLET BY MOUTH  EVERY EVENING (Patient taking differently: Take 40 mg by mouth at bedtime. ) 90 tablet 1  . sevelamer carbonate (RENVELA) 800 MG tablet Take  800-2,400 mg by mouth See admin instructions.  Take two - three tablets (1600 mg - 2400 mg) by mouth up to three times daily with meals, take one tablet (800 mg) with snacks    . vitamin C (ASCORBIC ACID) 500 MG tablet Take 500 mg by mouth daily.    . Vitamin D, Ergocalciferol, (DRISDOL) 1.25 MG (50000 UT) CAPS capsule Take 1 capsule (50,000 Units total) by mouth every Thursday. 12 capsule 3  . HYDROcodone-acetaminophen (NORCO/VICODIN) 5-325 MG tablet 1-2 tabs po tid prn pain (Patient taking differently: Take 1 tablet by mouth 3 (three) times daily as needed for moderate pain. ) 120 tablet 0   No facility-administered medications prior to visit.     Allergies  Allergen Reactions  . Clonidine Derivatives Palpitations and Other (See Comments)    VERY SEDATED  . Adhesive [Tape] Other (See Comments)    Tears skin up  . Codeine Nausea Only  . Nickel Rash  . Sulfa Antibiotics Other (See Comments)    Reaction unknown Did not feel well when taken Reaction unknown    ROS As per HPI  PE: Blood pressure 126/71, pulse 80, temperature 98.1 F (36.7 C), temperature source Temporal, resp. rate 16, height 4\' 11"  (1.499 m), SpO2 93 %. Gen: Alert, well appearing.  Patient is oriented to person, place, time, and situation. AFFECT: pleasant, lucid thought and speech. R groin and thigh w/out TTP. R LL swelling just a smidge >left, both with about 1+ pitting edema.  More lymphedema than anything else.    LABS:  None today  IMPRESSION AND PLAN:  1) Chronic R leg edema/swelling: Much improved since she had an extra HD treatment. She cancelled vasc MD appt for the local vasc MD and for [facility]. She feels like these are not necessary now, nor does she want to go through another eval and get more testing at this time.   Her pain med use (oxycodone) is minimal, usually 1 tab per day for pain when she does excessive ER/IR of R hip and for LBP that comes from sitting in [facility] a lot.  No changes in anything today. Will take hydrocodone off her  list b/c she doesn't use this anymore. If acute worsening of R leg swelling again, will check R leg venous doppler u/s again and look at iliac veins as well.  However, we discussed that even if a DVT is found, treatment with Heparin short term in the hospital would be her only treatment option since coumadin/DOAC are greater risk than benefit for her.  I did elect to keep her on ASA 325mg  qd at this time.  We recently got paperwork requesting info for renewal of her home oxygen. However, she does not qualify.  Her oxygen sat on RA today is 93%.  The last oxygen sat in EMR prior to her certification period (10/21/18) was 94% on 07/27/18. We'll let the [facility] agency know that she no longer qualifies and they can pick up her oxygen.  An After Visit Summary was printed and given to the patient.  Spent 25 min with pt today, with >50% of this time spent in counseling and care coordination regarding the above problems.  FOLLOW UP: Return in about 3 months (around 05/25/2019) for routine chronic illness f/u.  Signed:  Crissie Sickles, MD           02/22/2019

## 2019-02-23 DIAGNOSIS — D689 Coagulation defect, unspecified: Secondary | ICD-10-CM | POA: Diagnosis not present

## 2019-02-23 DIAGNOSIS — T782XXS Anaphylactic shock, unspecified, sequela: Secondary | ICD-10-CM | POA: Insufficient documentation

## 2019-02-23 DIAGNOSIS — E876 Hypokalemia: Secondary | ICD-10-CM | POA: Diagnosis not present

## 2019-02-23 DIAGNOSIS — N2581 Secondary hyperparathyroidism of renal origin: Secondary | ICD-10-CM | POA: Diagnosis not present

## 2019-02-23 DIAGNOSIS — N186 End stage renal disease: Secondary | ICD-10-CM | POA: Diagnosis not present

## 2019-02-23 DIAGNOSIS — Z992 Dependence on renal dialysis: Secondary | ICD-10-CM | POA: Diagnosis not present

## 2019-02-23 DIAGNOSIS — D509 Iron deficiency anemia, unspecified: Secondary | ICD-10-CM | POA: Diagnosis not present

## 2019-02-25 DIAGNOSIS — Z992 Dependence on renal dialysis: Secondary | ICD-10-CM | POA: Diagnosis not present

## 2019-02-25 DIAGNOSIS — E876 Hypokalemia: Secondary | ICD-10-CM | POA: Diagnosis not present

## 2019-02-25 DIAGNOSIS — N2581 Secondary hyperparathyroidism of renal origin: Secondary | ICD-10-CM | POA: Diagnosis not present

## 2019-02-25 DIAGNOSIS — D509 Iron deficiency anemia, unspecified: Secondary | ICD-10-CM | POA: Diagnosis not present

## 2019-02-25 DIAGNOSIS — D689 Coagulation defect, unspecified: Secondary | ICD-10-CM | POA: Diagnosis not present

## 2019-02-25 DIAGNOSIS — N186 End stage renal disease: Secondary | ICD-10-CM | POA: Diagnosis not present

## 2019-02-28 DIAGNOSIS — N186 End stage renal disease: Secondary | ICD-10-CM | POA: Diagnosis not present

## 2019-02-28 DIAGNOSIS — D509 Iron deficiency anemia, unspecified: Secondary | ICD-10-CM | POA: Diagnosis not present

## 2019-02-28 DIAGNOSIS — E876 Hypokalemia: Secondary | ICD-10-CM | POA: Diagnosis not present

## 2019-02-28 DIAGNOSIS — D689 Coagulation defect, unspecified: Secondary | ICD-10-CM | POA: Diagnosis not present

## 2019-02-28 DIAGNOSIS — N2581 Secondary hyperparathyroidism of renal origin: Secondary | ICD-10-CM | POA: Diagnosis not present

## 2019-02-28 DIAGNOSIS — Z992 Dependence on renal dialysis: Secondary | ICD-10-CM | POA: Diagnosis not present

## 2019-03-02 DIAGNOSIS — D689 Coagulation defect, unspecified: Secondary | ICD-10-CM | POA: Diagnosis not present

## 2019-03-02 DIAGNOSIS — N2581 Secondary hyperparathyroidism of renal origin: Secondary | ICD-10-CM | POA: Diagnosis not present

## 2019-03-02 DIAGNOSIS — D509 Iron deficiency anemia, unspecified: Secondary | ICD-10-CM | POA: Diagnosis not present

## 2019-03-02 DIAGNOSIS — E876 Hypokalemia: Secondary | ICD-10-CM | POA: Diagnosis not present

## 2019-03-02 DIAGNOSIS — N186 End stage renal disease: Secondary | ICD-10-CM | POA: Diagnosis not present

## 2019-03-02 DIAGNOSIS — Z992 Dependence on renal dialysis: Secondary | ICD-10-CM | POA: Diagnosis not present

## 2019-03-03 ENCOUNTER — Telehealth: Payer: Self-pay

## 2019-03-03 NOTE — Telephone Encounter (Signed)
Spoke with Valerie Terrell at [facility] to inform pt no longer qualifies for oxygen. She is going to arrange for pick up. The certificate of medical necessity still needs to be filled out so they can bill for the amount of time she first received until she no longer qualified.  Will place on PCP desk for completion.

## 2019-03-04 DIAGNOSIS — N186 End stage renal disease: Secondary | ICD-10-CM | POA: Diagnosis not present

## 2019-03-04 DIAGNOSIS — Z992 Dependence on renal dialysis: Secondary | ICD-10-CM | POA: Diagnosis not present

## 2019-03-04 DIAGNOSIS — D509 Iron deficiency anemia, unspecified: Secondary | ICD-10-CM | POA: Diagnosis not present

## 2019-03-04 DIAGNOSIS — D689 Coagulation defect, unspecified: Secondary | ICD-10-CM | POA: Diagnosis not present

## 2019-03-04 DIAGNOSIS — N2581 Secondary hyperparathyroidism of renal origin: Secondary | ICD-10-CM | POA: Diagnosis not present

## 2019-03-04 DIAGNOSIS — E876 Hypokalemia: Secondary | ICD-10-CM | POA: Diagnosis not present

## 2019-03-07 DIAGNOSIS — E876 Hypokalemia: Secondary | ICD-10-CM | POA: Diagnosis not present

## 2019-03-07 DIAGNOSIS — N186 End stage renal disease: Secondary | ICD-10-CM | POA: Diagnosis not present

## 2019-03-07 DIAGNOSIS — D509 Iron deficiency anemia, unspecified: Secondary | ICD-10-CM | POA: Diagnosis not present

## 2019-03-07 DIAGNOSIS — D689 Coagulation defect, unspecified: Secondary | ICD-10-CM | POA: Diagnosis not present

## 2019-03-07 DIAGNOSIS — N2581 Secondary hyperparathyroidism of renal origin: Secondary | ICD-10-CM | POA: Diagnosis not present

## 2019-03-07 DIAGNOSIS — Z992 Dependence on renal dialysis: Secondary | ICD-10-CM | POA: Diagnosis not present

## 2019-03-09 ENCOUNTER — Ambulatory Visit (INDEPENDENT_AMBULATORY_CARE_PROVIDER_SITE_OTHER): Payer: Medicare Other | Admitting: *Deleted

## 2019-03-09 DIAGNOSIS — Z992 Dependence on renal dialysis: Secondary | ICD-10-CM | POA: Diagnosis not present

## 2019-03-09 DIAGNOSIS — N186 End stage renal disease: Secondary | ICD-10-CM | POA: Diagnosis not present

## 2019-03-09 DIAGNOSIS — N2581 Secondary hyperparathyroidism of renal origin: Secondary | ICD-10-CM | POA: Diagnosis not present

## 2019-03-09 DIAGNOSIS — D509 Iron deficiency anemia, unspecified: Secondary | ICD-10-CM | POA: Diagnosis not present

## 2019-03-09 DIAGNOSIS — E876 Hypokalemia: Secondary | ICD-10-CM | POA: Diagnosis not present

## 2019-03-09 DIAGNOSIS — I428 Other cardiomyopathies: Secondary | ICD-10-CM

## 2019-03-09 DIAGNOSIS — D689 Coagulation defect, unspecified: Secondary | ICD-10-CM | POA: Diagnosis not present

## 2019-03-09 DIAGNOSIS — I5042 Chronic combined systolic (congestive) and diastolic (congestive) heart failure: Secondary | ICD-10-CM | POA: Diagnosis not present

## 2019-03-11 DIAGNOSIS — E876 Hypokalemia: Secondary | ICD-10-CM | POA: Diagnosis not present

## 2019-03-11 DIAGNOSIS — D509 Iron deficiency anemia, unspecified: Secondary | ICD-10-CM | POA: Diagnosis not present

## 2019-03-11 DIAGNOSIS — N2581 Secondary hyperparathyroidism of renal origin: Secondary | ICD-10-CM | POA: Diagnosis not present

## 2019-03-11 DIAGNOSIS — Z992 Dependence on renal dialysis: Secondary | ICD-10-CM | POA: Diagnosis not present

## 2019-03-11 DIAGNOSIS — D689 Coagulation defect, unspecified: Secondary | ICD-10-CM | POA: Diagnosis not present

## 2019-03-11 DIAGNOSIS — N186 End stage renal disease: Secondary | ICD-10-CM | POA: Diagnosis not present

## 2019-03-12 LAB — CUP PACEART REMOTE DEVICE CHECK
Battery Remaining Longevity: 127 mo
Battery Remaining Percentage: 95.5 %
Battery Voltage: 3.02 V
Brady Statistic AP VP Percent: 1 %
Brady Statistic AP VS Percent: 2.5 %
Brady Statistic AS VP Percent: 1 %
Brady Statistic AS VS Percent: 98 %
Brady Statistic RA Percent Paced: 2.4 %
Brady Statistic RV Percent Paced: 1 %
Date Time Interrogation Session: 20201024221958
Implantable Lead Implant Date: 20180719
Implantable Lead Implant Date: 20180719
Implantable Lead Location: 753859
Implantable Lead Location: 753860
Implantable Pulse Generator Implant Date: 20180719
Lead Channel Impedance Value: 450 Ohm
Lead Channel Impedance Value: 530 Ohm
Lead Channel Pacing Threshold Amplitude: 0.75 V
Lead Channel Pacing Threshold Amplitude: 0.75 V
Lead Channel Pacing Threshold Pulse Width: 0.5 ms
Lead Channel Pacing Threshold Pulse Width: 0.5 ms
Lead Channel Sensing Intrinsic Amplitude: 12 mV
Lead Channel Sensing Intrinsic Amplitude: 4.1 mV
Lead Channel Setting Pacing Amplitude: 2 V
Lead Channel Setting Pacing Amplitude: 2.5 V
Lead Channel Setting Pacing Pulse Width: 0.5 ms
Lead Channel Setting Sensing Sensitivity: 2 mV
Pulse Gen Model: 2272
Pulse Gen Serial Number: 8918511

## 2019-03-14 DIAGNOSIS — N186 End stage renal disease: Secondary | ICD-10-CM | POA: Diagnosis not present

## 2019-03-14 DIAGNOSIS — D509 Iron deficiency anemia, unspecified: Secondary | ICD-10-CM | POA: Diagnosis not present

## 2019-03-14 DIAGNOSIS — N2581 Secondary hyperparathyroidism of renal origin: Secondary | ICD-10-CM | POA: Diagnosis not present

## 2019-03-14 DIAGNOSIS — Z992 Dependence on renal dialysis: Secondary | ICD-10-CM | POA: Diagnosis not present

## 2019-03-14 DIAGNOSIS — E876 Hypokalemia: Secondary | ICD-10-CM | POA: Diagnosis not present

## 2019-03-14 DIAGNOSIS — D689 Coagulation defect, unspecified: Secondary | ICD-10-CM | POA: Diagnosis not present

## 2019-03-16 DIAGNOSIS — N2581 Secondary hyperparathyroidism of renal origin: Secondary | ICD-10-CM | POA: Diagnosis not present

## 2019-03-16 DIAGNOSIS — Z992 Dependence on renal dialysis: Secondary | ICD-10-CM | POA: Diagnosis not present

## 2019-03-16 DIAGNOSIS — E876 Hypokalemia: Secondary | ICD-10-CM | POA: Diagnosis not present

## 2019-03-16 DIAGNOSIS — D689 Coagulation defect, unspecified: Secondary | ICD-10-CM | POA: Diagnosis not present

## 2019-03-16 DIAGNOSIS — N186 End stage renal disease: Secondary | ICD-10-CM | POA: Diagnosis not present

## 2019-03-16 DIAGNOSIS — D509 Iron deficiency anemia, unspecified: Secondary | ICD-10-CM | POA: Diagnosis not present

## 2019-03-17 DIAGNOSIS — M5416 Radiculopathy, lumbar region: Secondary | ICD-10-CM | POA: Diagnosis not present

## 2019-03-18 DIAGNOSIS — I120 Hypertensive chronic kidney disease with stage 5 chronic kidney disease or end stage renal disease: Secondary | ICD-10-CM | POA: Diagnosis not present

## 2019-03-18 DIAGNOSIS — N186 End stage renal disease: Secondary | ICD-10-CM | POA: Diagnosis not present

## 2019-03-18 DIAGNOSIS — Z992 Dependence on renal dialysis: Secondary | ICD-10-CM | POA: Diagnosis not present

## 2019-03-20 DIAGNOSIS — I5042 Chronic combined systolic (congestive) and diastolic (congestive) heart failure: Secondary | ICD-10-CM | POA: Diagnosis not present

## 2019-03-20 DIAGNOSIS — G894 Chronic pain syndrome: Secondary | ICD-10-CM | POA: Diagnosis not present

## 2019-03-20 NOTE — Progress Notes (Signed)
Remote pacemaker transmission.   

## 2019-03-21 DIAGNOSIS — I871 Compression of vein: Secondary | ICD-10-CM | POA: Diagnosis not present

## 2019-03-21 DIAGNOSIS — D689 Coagulation defect, unspecified: Secondary | ICD-10-CM | POA: Diagnosis not present

## 2019-03-21 DIAGNOSIS — D509 Iron deficiency anemia, unspecified: Secondary | ICD-10-CM | POA: Diagnosis not present

## 2019-03-21 DIAGNOSIS — Z992 Dependence on renal dialysis: Secondary | ICD-10-CM | POA: Diagnosis not present

## 2019-03-21 DIAGNOSIS — R52 Pain, unspecified: Secondary | ICD-10-CM | POA: Diagnosis not present

## 2019-03-21 DIAGNOSIS — T82868A Thrombosis of vascular prosthetic devices, implants and grafts, initial encounter: Secondary | ICD-10-CM | POA: Diagnosis not present

## 2019-03-21 DIAGNOSIS — N2581 Secondary hyperparathyroidism of renal origin: Secondary | ICD-10-CM | POA: Diagnosis not present

## 2019-03-21 DIAGNOSIS — D631 Anemia in chronic kidney disease: Secondary | ICD-10-CM | POA: Diagnosis not present

## 2019-03-21 DIAGNOSIS — N186 End stage renal disease: Secondary | ICD-10-CM | POA: Diagnosis not present

## 2019-03-21 DIAGNOSIS — E876 Hypokalemia: Secondary | ICD-10-CM | POA: Diagnosis not present

## 2019-03-21 HISTORY — PX: OTHER SURGICAL HISTORY: SHX169

## 2019-03-24 ENCOUNTER — Emergency Department (HOSPITAL_COMMUNITY): Payer: Medicare Other

## 2019-03-24 ENCOUNTER — Inpatient Hospital Stay (HOSPITAL_COMMUNITY)
Admission: EM | Admit: 2019-03-24 | Discharge: 2019-03-26 | DRG: 640 | Disposition: A | Discharge status: Home Health | Payer: Medicare Other | Attending: Internal Medicine | Admitting: Internal Medicine

## 2019-03-24 ENCOUNTER — Other Ambulatory Visit: Payer: Self-pay

## 2019-03-24 ENCOUNTER — Encounter (HOSPITAL_COMMUNITY): Payer: Self-pay | Admitting: Emergency Medicine

## 2019-03-24 DIAGNOSIS — E875 Hyperkalemia: Secondary | ICD-10-CM | POA: Diagnosis not present

## 2019-03-24 DIAGNOSIS — I132 Hypertensive heart and chronic kidney disease with heart failure and with stage 5 chronic kidney disease, or end stage renal disease: Secondary | ICD-10-CM | POA: Diagnosis present

## 2019-03-24 DIAGNOSIS — Z7982 Long term (current) use of aspirin: Secondary | ICD-10-CM

## 2019-03-24 DIAGNOSIS — E785 Hyperlipidemia, unspecified: Secondary | ICD-10-CM | POA: Diagnosis not present

## 2019-03-24 DIAGNOSIS — T82868A Thrombosis of vascular prosthetic devices, implants and grafts, initial encounter: Secondary | ICD-10-CM | POA: Diagnosis not present

## 2019-03-24 DIAGNOSIS — R0902 Hypoxemia: Secondary | ICD-10-CM | POA: Diagnosis not present

## 2019-03-24 DIAGNOSIS — I5042 Chronic combined systolic (congestive) and diastolic (congestive) heart failure: Secondary | ICD-10-CM | POA: Diagnosis present

## 2019-03-24 DIAGNOSIS — I480 Paroxysmal atrial fibrillation: Secondary | ICD-10-CM | POA: Diagnosis present

## 2019-03-24 DIAGNOSIS — E8889 Other specified metabolic disorders: Secondary | ICD-10-CM | POA: Diagnosis not present

## 2019-03-24 DIAGNOSIS — N186 End stage renal disease: Secondary | ICD-10-CM | POA: Diagnosis not present

## 2019-03-24 DIAGNOSIS — Z950 Presence of cardiac pacemaker: Secondary | ICD-10-CM

## 2019-03-24 DIAGNOSIS — I4510 Unspecified right bundle-branch block: Secondary | ICD-10-CM | POA: Diagnosis not present

## 2019-03-24 DIAGNOSIS — Z8673 Personal history of transient ischemic attack (TIA), and cerebral infarction without residual deficits: Secondary | ICD-10-CM

## 2019-03-24 DIAGNOSIS — E8770 Fluid overload, unspecified: Secondary | ICD-10-CM | POA: Diagnosis not present

## 2019-03-24 DIAGNOSIS — J9621 Acute and chronic respiratory failure with hypoxia: Secondary | ICD-10-CM | POA: Diagnosis present

## 2019-03-24 DIAGNOSIS — J9601 Acute respiratory failure with hypoxia: Secondary | ICD-10-CM

## 2019-03-24 DIAGNOSIS — N2581 Secondary hyperparathyroidism of renal origin: Secondary | ICD-10-CM | POA: Diagnosis not present

## 2019-03-24 DIAGNOSIS — Z9115 Patient's noncompliance with renal dialysis: Secondary | ICD-10-CM

## 2019-03-24 DIAGNOSIS — J449 Chronic obstructive pulmonary disease, unspecified: Secondary | ICD-10-CM | POA: Diagnosis not present

## 2019-03-24 DIAGNOSIS — Z79899 Other long term (current) drug therapy: Secondary | ICD-10-CM

## 2019-03-24 DIAGNOSIS — M8580 Other specified disorders of bone density and structure, unspecified site: Secondary | ICD-10-CM | POA: Diagnosis present

## 2019-03-24 DIAGNOSIS — I120 Hypertensive chronic kidney disease with stage 5 chronic kidney disease or end stage renal disease: Secondary | ICD-10-CM | POA: Diagnosis not present

## 2019-03-24 DIAGNOSIS — D638 Anemia in other chronic diseases classified elsewhere: Secondary | ICD-10-CM | POA: Diagnosis not present

## 2019-03-24 DIAGNOSIS — Z743 Need for continuous supervision: Secondary | ICD-10-CM | POA: Diagnosis not present

## 2019-03-24 DIAGNOSIS — R069 Unspecified abnormalities of breathing: Secondary | ICD-10-CM | POA: Diagnosis not present

## 2019-03-24 DIAGNOSIS — Z992 Dependence on renal dialysis: Secondary | ICD-10-CM | POA: Diagnosis not present

## 2019-03-24 DIAGNOSIS — T82858A Stenosis of vascular prosthetic devices, implants and grafts, initial encounter: Secondary | ICD-10-CM | POA: Diagnosis not present

## 2019-03-24 DIAGNOSIS — R0600 Dyspnea, unspecified: Secondary | ICD-10-CM | POA: Diagnosis present

## 2019-03-24 DIAGNOSIS — Z87891 Personal history of nicotine dependence: Secondary | ICD-10-CM

## 2019-03-24 DIAGNOSIS — Z20828 Contact with and (suspected) exposure to other viral communicable diseases: Secondary | ICD-10-CM | POA: Diagnosis present

## 2019-03-24 DIAGNOSIS — R0602 Shortness of breath: Secondary | ICD-10-CM | POA: Diagnosis not present

## 2019-03-24 LAB — CBC WITH DIFFERENTIAL/PLATELET
Abs Immature Granulocytes: 0.08 10*3/uL — ABNORMAL HIGH (ref 0.00–0.07)
Basophils Absolute: 0 10*3/uL (ref 0.0–0.1)
Basophils Relative: 0 %
Eosinophils Absolute: 0.2 10*3/uL (ref 0.0–0.5)
Eosinophils Relative: 3 %
HCT: 34.3 % — ABNORMAL LOW (ref 36.0–46.0)
Hemoglobin: 10.4 g/dL — ABNORMAL LOW (ref 12.0–15.0)
Immature Granulocytes: 1 %
Lymphocytes Relative: 11 %
Lymphs Abs: 0.8 10*3/uL (ref 0.7–4.0)
MCH: 29.5 pg (ref 26.0–34.0)
MCHC: 30.3 g/dL (ref 30.0–36.0)
MCV: 97.4 fL (ref 80.0–100.0)
Monocytes Absolute: 1.1 10*3/uL — ABNORMAL HIGH (ref 0.1–1.0)
Monocytes Relative: 16 %
Neutro Abs: 4.9 10*3/uL (ref 1.7–7.7)
Neutrophils Relative %: 69 %
Platelets: 244 10*3/uL (ref 150–400)
RBC: 3.52 MIL/uL — ABNORMAL LOW (ref 3.87–5.11)
RDW: 18 % — ABNORMAL HIGH (ref 11.5–15.5)
WBC: 7 10*3/uL (ref 4.0–10.5)
nRBC: 0 % (ref 0.0–0.2)

## 2019-03-24 LAB — I-STAT CHEM 8, ED
BUN: 47 mg/dL — ABNORMAL HIGH (ref 8–23)
Calcium, Ion: 0.89 mmol/L — CL (ref 1.15–1.40)
Chloride: 101 mmol/L (ref 98–111)
Creatinine, Ser: 7.4 mg/dL — ABNORMAL HIGH (ref 0.44–1.00)
Glucose, Bld: 88 mg/dL (ref 70–99)
HCT: 33 % — ABNORMAL LOW (ref 36.0–46.0)
Hemoglobin: 11.2 g/dL — ABNORMAL LOW (ref 12.0–15.0)
Potassium: 4.8 mmol/L (ref 3.5–5.1)
Sodium: 135 mmol/L (ref 135–145)
TCO2: 26 mmol/L (ref 22–32)

## 2019-03-24 LAB — BASIC METABOLIC PANEL
Anion gap: 18 — ABNORMAL HIGH (ref 5–15)
BUN: 40 mg/dL — ABNORMAL HIGH (ref 8–23)
CO2: 24 mmol/L (ref 22–32)
Calcium: 8 mg/dL — ABNORMAL LOW (ref 8.9–10.3)
Chloride: 95 mmol/L — ABNORMAL LOW (ref 98–111)
Creatinine, Ser: 6.88 mg/dL — ABNORMAL HIGH (ref 0.44–1.00)
GFR calc Af Amer: 6 mL/min — ABNORMAL LOW (ref >60)
GFR calc non Af Amer: 5 mL/min — ABNORMAL LOW (ref >60)
Glucose, Bld: 94 mg/dL (ref 70–99)
Potassium: 5 mmol/L (ref 3.5–5.1)
Sodium: 137 mmol/L (ref 135–145)

## 2019-03-24 LAB — TROPONIN I (HIGH SENSITIVITY)
Troponin I (High Sensitivity): 35 ng/L — ABNORMAL HIGH (ref <18)
Troponin I (High Sensitivity): 53 ng/L — ABNORMAL HIGH (ref <18)

## 2019-03-24 LAB — BRAIN NATRIURETIC PEPTIDE: B Natriuretic Peptide: 2088.4 pg/mL — ABNORMAL HIGH (ref 0.0–100.0)

## 2019-03-24 LAB — SARS CORONAVIRUS 2 (TAT 6-24 HRS): SARS Coronavirus 2: NEGATIVE

## 2019-03-24 MED ORDER — DOCUSATE SODIUM 100 MG PO CAPS
100.0000 mg | ORAL_CAPSULE | Freq: Every day | ORAL | Status: DC
  Filled 2019-03-24 (×2): qty 1

## 2019-03-24 MED ORDER — ASPIRIN EC 325 MG PO TBEC
325.0000 mg | DELAYED_RELEASE_TABLET | Freq: Every evening | ORAL | Status: DC
  Administered 2019-03-24: 325 mg via ORAL
  Filled 2019-03-24 (×2): qty 1

## 2019-03-24 MED ORDER — SODIUM CHLORIDE 0.9% FLUSH: 3.0000 mL | INTRAVENOUS | Status: DC | PRN

## 2019-03-24 MED ORDER — ONDANSETRON HCL 4 MG PO TABS: 4.0000 mg | ORAL_TABLET | Freq: Three times a day (TID) | ORAL | Status: DC | PRN

## 2019-03-24 MED ORDER — SEVELAMER CARBONATE 800 MG PO TABS
1600.0000 mg | ORAL_TABLET | Freq: Three times a day (TID) | ORAL | Status: DC
  Administered 2019-03-25 – 2019-03-26 (×2): 1600 mg via ORAL
  Filled 2019-03-24 (×2): qty 2

## 2019-03-24 MED ORDER — POLYVINYL ALCOHOL 1.4 % OP SOLN: 1.0000 [drp] | Freq: Every day | OPHTHALMIC | Status: DC | PRN

## 2019-03-24 MED ORDER — HEPARIN SODIUM (PORCINE) 5000 UNIT/ML IJ SOLN
5000.0000 [IU] | Freq: Two times a day (BID) | INTRAMUSCULAR | Status: DC
  Filled 2019-03-24 (×4): qty 1

## 2019-03-24 MED ORDER — ONDANSETRON HCL 4 MG/2ML IJ SOLN: 4.0000 mg | Freq: Four times a day (QID) | INTRAMUSCULAR | Status: DC | PRN

## 2019-03-24 MED ORDER — SODIUM CHLORIDE 0.9% FLUSH
3.0000 mL | Freq: Two times a day (BID) | INTRAVENOUS | Status: DC
  Administered 2019-03-25 (×3): 3 mL via INTRAVENOUS

## 2019-03-24 MED ORDER — CARVEDILOL 3.125 MG PO TABS
3.1250 mg | ORAL_TABLET | Freq: Two times a day (BID) | ORAL | Status: DC
  Administered 2019-03-26: 3.125 mg via ORAL
  Filled 2019-03-24 (×2): qty 1

## 2019-03-24 MED ORDER — SODIUM CHLORIDE 0.9 % IV SOLN: 250.0000 mL | INTRAVENOUS | Status: DC | PRN

## 2019-03-24 MED ORDER — OXYCODONE HCL 5 MG PO TABS: 5.0000 mg | ORAL_TABLET | Freq: Four times a day (QID) | ORAL | Status: DC | PRN

## 2019-03-24 MED ORDER — LIDOCAINE-PRILOCAINE 2.5-2.5 % EX CREA: 1.0000 "application " | TOPICAL_CREAM | CUTANEOUS | Status: DC

## 2019-03-24 MED ORDER — CHLORHEXIDINE GLUCONATE CLOTH 2 % EX PADS: 6.0000 | MEDICATED_PAD | Freq: Every day | CUTANEOUS | Status: DC

## 2019-03-24 MED ORDER — SEVELAMER CARBONATE 800 MG PO TABS: 800.0000 mg | ORAL_TABLET | ORAL | Status: DC | PRN

## 2019-03-24 MED ORDER — ACETAMINOPHEN 500 MG PO TABS
500.0000 mg | ORAL_TABLET | Freq: Three times a day (TID) | ORAL | Status: DC | PRN
  Administered 2019-03-24 – 2019-03-26 (×2): 500 mg via ORAL
  Filled 2019-03-24: qty 2
  Filled 2019-03-24: qty 1

## 2019-03-24 MED ORDER — PRAVASTATIN SODIUM 40 MG PO TABS
40.0000 mg | ORAL_TABLET | Freq: Every evening | ORAL | Status: DC
  Filled 2019-03-24 (×2): qty 1

## 2019-03-24 MED ORDER — AMIODARONE HCL 200 MG PO TABS
200.0000 mg | ORAL_TABLET | Freq: Every day | ORAL | Status: DC
  Administered 2019-03-26: 200 mg via ORAL
  Filled 2019-03-24 (×3): qty 1

## 2019-03-24 MED ORDER — ONDANSETRON HCL 4 MG PO TABS: 4.0000 mg | ORAL_TABLET | Freq: Four times a day (QID) | ORAL | Status: DC | PRN

## 2019-03-24 MED ORDER — ISOSORBIDE MONONITRATE ER 60 MG PO TB24
60.0000 mg | ORAL_TABLET | Freq: Every day | ORAL | Status: DC
  Administered 2019-03-26: 60 mg via ORAL
  Filled 2019-03-24 (×3): qty 1

## 2019-03-24 NOTE — H&P (Signed)
[facility]                                                                                    Patient Demographics  Valerie Terrell, is a 85 y.o. female  CSN: [id number]  MRN: [id number]  DOB - April 06, 1933  Admit Date - 03/24/2019  Outpatient Primary MD for the patient is McGowen, Adrian Blackwater, MD   With History of -  Past Medical History:  Diagnosis Date  . Anemia of chronic disease 2018   + anemia of CRI?  [name] Atrophy of left kidney    with absent blood flow by renal artery dopplers (Dr. Gwenlyn Found)  . 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 Chronic combined systolic and diastolic CHF (congestive heart failure) ([place])    a. 11/2016: echo showing EF of 35-40%, RV strain noted, mild MR and mild TR.   Echo showed much improved EF 12/2017 (55-60%)  . Chronic respiratory failure with hypoxia ([place]) 02/11/2017   Now on chronic O2 (intermittent as of summer 2019).    . Closed fracture of right superior pubic ramus ([place]) 07/18/2018   Non op mgmt (Dr. Victorino December was ortho who saw her in hosp but i don't think pt followed up with him).  . Closed right hip fracture ([place]) 10/04/2018   ORIF  . Debilitated patient    WC dependent as of 2018.  Hosp bed + full assistance with most ADLs s/p hip fx surg 09/2018.  [name] ESRD on hemodialysis ([place]) 01/2017   T/Th/Sat schedule- [place].  Right basilic AV fistula [id number].  Graft 2019.  [name] History of adenomatous polyp of colon   . History of kidney stones   . History of subarachnoid hemorrhage 10/2014   after syncope and while on xarelto  . Hyperlipidemia   . Hypertension    Difficult to control, in the setting of one functioning kidney: pt was referred to nephrology by Dr. Gwenlyn Found 06/2015.  . Lumbar radiculopathy 2012  . Lumbar spinal stenosis 2019   facet injections only very short term relief.  L1 and L2 selective nerve root blocks helpful 04/2018.  . Lumbar spondylosis    MR 07/2016---no sign of spinal nerve compression or  cord compression.  Pt set up with outpt ortho while admitted to hosp 07/2016.  . Malnourished ([place])   . Metatarsal fracture 06/10/2016   Nondisplaced, left 5th metatarsal--pt was referred to ortho  . Osteopenia 2014   T-score -2.1  . PAF (paroxysmal atrial fibrillation) (HCC)    Eliquis started after BRAO and CVA.  She was changed to warfarin 2018. All anticoag stopped due to recurrent falls.  . Presence of permanent cardiac pacemaker   . Right rib fracture 12/2016   s/p fall  . Sick sinus syndrome ([place]) 11/2016   Dual chamber pacer insertion 2018 (Dr. Lovena Le)  . Stroke ([place]) 0000000   cardioembolic (had CVA while on no anticoag)--"scattered subacute punctate infarcts: 1 in R parietal lobe and 2 in occipital cortex" on MRI br.  CT angio head/neck: aortic arch athero.  R ICA 20% stenosis, L ICA w/out any stenosis.  . Thoracic back pain 01/2017; 01/2018  2018: Facet?  Dr. Ramos->steroid injection.  01/2018-->CT T spine to check for a new comp fx-->none found.  Selective nerve root inj in T spine helpful on R side.   . Thoracic compression fracture ([place]) 07/2016   T3.  T7 and T8-- T8 kyphoplasty during hosp admission 07/2016.  Neuro referred pt to pain mgmt for consideration of injection 09/2016.  I referred her to endo 08/2016 for consideration of calcitonin treatment.      Past Surgical History:  Procedure Laterality Date  . APPENDECTOMY  child  . AV FISTULA PLACEMENT Right 03/31/2017   Procedure: Right ARM Basilic vein transposition;  Surgeon: Conrad [place], MD;  Location: [facility] OR;  Service: Vascular;  Laterality: Right;  . AV FISTULA PLACEMENT Right 12/06/2017   Procedure: INSERTION OF ARTERIOVENOUS (AV) GORE-TEX GRAFT ARM RIGHT UPPER EXTREMITY;  Surgeon: [name], MD;  Location: [facility];  Service: Vascular;  Laterality: Right;  . CARDIOVASCULAR STRESS TEST  01/31/2016   Stress myoview: NORMAL/Low risk.  EF 56%.  . [facility]  . COLONOSCOPY  2015   + hx of adenomatous polyps.   Need digest health spec in [place] records to see when pt due for next colonoscopy  . EYE SURGERY Bilateral    Catarct  . INSERTION OF DIALYSIS CATHETER Right 01/29/2017   Procedure: INSERTION OF DIALYSIS CATHETER- RIGHT INTERNAL JUGULAR;  Surgeon: [name], MD;  Location: [facility];  Service: Vascular;  Laterality: Right;  . INTRAMEDULLARY (IM) NAIL INTERTROCHANTERIC Right 10/04/2018   Procedure: INTRAMEDULLARY (IM) NAIL INTERTROCHANTRIC;  Surgeon: Nicholes Stairs, MD;  Location: [facility];  Service: Orthopedics;  Laterality: Right;  . IR GENERIC HISTORICAL  07/24/2016   IR KYPHO THORACIC WITH BONE BIOPSY 07/24/2016 Luanne Bras, MD [facility]-INTER[facility] RAD  . KYPHOPLASTY  07/27/2016   T8  . PACEMAKER IMPLANT N/A 12/03/2016   Procedure: Pacemaker Implant;  Surgeon: Evans Lance, MD;  Location: [facility] CV LAB;  Service: Cardiovascular;  Laterality: N/A;  . Renal artery dopplers  02/26/2016   Her right renal dimension was 11 cm pole to pole with mild to moderate right renal artery stenosis. A right renal aortic ratio was 3.22 suggesting less than a 50% stenosis.  . Right upper arm AV Gore-Tex graft  12/06/2017   Dr. Donnetta Hutching  . TONSILLECTOMY    . TRANSTHORACIC ECHOCARDIOGRAM  01/29/2016; 11/2016; 12/2017   2017: EF 55-60%, normal LV wall motion, grade I DD.  No cardiac source of emboli was seen.  2018: EF 35-40%, could not assess DD due to a-fib, pulm HTN noted. 12/2017 EF 55-60%, nl wall motion, grd II DD, mod MR and pulm regurg, increased pulm pressure (peak 52).    in for   Chief Complaint  Patient presents with  . Shortness of Breath     HPI  Valerie Terrell  is a 85 y.o. female, with a past medical history significant for ESRD sent from outpatient nephrology center after she missed few sessions of dialysis this week for shortness of breath.  Patient had issues with clotting her graft this week and she had a partial session on November 3 but missed her dialysis sessions.  Today she is having  increasing shortness of breath with orthopnea, no chest pains, no fever, no chills, no abdominal pain, no nausea no vomiting.  Patient had left-sided leg swelling from lesser trochanter hip fracture which is unchanged and is no longer on anticoagulation. In the emergency room her white blood cell count was  7 with a BNP of 2088.  BUN 47 and creatinine 7.40.  Her chest x-ray shows atelectasis versus pneumonia at the left lung base with chronic interstitial lung changes. Discussed with nephrology who will be dialyzing patient today    Review of Systems    In addition to the HPI above,  No Fever-chills, No Headache, No changes with Vision or hearing, No problems swallowing food or Liquids, No Chest pain, Cough or Shortness of Breath, No Abdominal pain, No Nausea or Vommitting, Bowel movements are regular, No Blood in stool or Urine, No dysuria, No new skin rashes or bruises, No new joints pains-aches,  No new weakness, tingling, numbness in any extremity, No recent weight gain or loss, No polyuria, polydypsia or polyphagia, No significant Mental Stressors.  A full 10 point Review of Systems was done, except as stated above, all other Review of Systems were negative.   Social History Social History   Tobacco Use  . Smoking status: Former Smoker    Years: 22.00    Quit date: 03/17/1972    Years since quitting: 47.0  . Smokeless tobacco: Never Used  Substance Use Topics  . Alcohol use: Yes    Alcohol/week: 1.0 standard drinks    Types: 1 Glasses of wine per week    Comment: wine     Family History Family History  Problem Relation Age of Onset  . Cancer Mother   . Heart disease Father   . Early death Father   . Sudden Cardiac Death Neg Hx      Prior to Admission medications   Medication Sig Start Date End Date Taking? Authorizing Provider  acetaminophen (TYLENOL) 500 MG tablet Take 500-1,000 mg 3 (three) times daily as needed by mouth for moderate pain.     [provider]  amiodarone (PACERONE) 200 MG tablet TAKE 1 TABLET BY MOUTH  DAILY Patient taking differently: Take 200 mg by mouth See admin instructions. Take one tablet (200 mg) by mouth on Sunday, Monday, Wednesday, Friday mornings, take one tablet (200 mg) on Tuesday, Thursday, Saturday after dialysis 04/11/18   Lorretta Harp, MD  aspirin EC 325 MG tablet Take 325 mg by mouth every evening.    [provider]  b complex-vitamin c-folic acid (NEPHRO-VITE) 0.8 MG TABS tablet Take 1 tablet by mouth See admin instructions. Take one tablet by mouth on Tuesday, Thursday, Saturday - after dialysis 09/07/18   [provider]  carvedilol (COREG) 3.125 MG tablet TAKE 1 TABLET BY MOUTH TWO  TIMES DAILY Patient taking differently: Take 3.125 mg by mouth 2 (two) times daily as needed (SBP >120).  06/27/18   Lorretta Harp, MD  docusate sodium (COLACE) 100 MG capsule Take 100 mg by mouth at bedtime.     [provider]  heparin 1000 UNIT/ML injection Heparin Sodium (Porcine) 1,000 Units/mL Systemic 02/07/19 02/06/20  [provider]  isosorbide mononitrate (IMDUR) 60 MG 24 hr tablet TAKE 1 TABLET BY MOUTH  DAILY Patient taking differently: Take 60 mg by mouth daily. Take one tablet (60 mg) by mouth on Sunday, Monday, Wednesday, Friday mornings, take one tablet (60 mg) on Tuesday, Thursday, Saturday after dialysis 10/31/18   McGowen, Adrian Blackwater, MD  lidocaine-prilocaine (EMLA) cream Apply 1 application topically See admin instructions. Apply small amount to access site (AVF) one hour before dialysis, cover with occlusive dressing (saran wrap) 11/11/18   [provider]  Multiple Vitamins-Minerals (MULTIVITAMIN WITH MINERALS) tablet Take 1 tablet by mouth at  bedtime.     [provider]  ondansetron (ZOFRAN) 4 MG tablet Take 1 tablet (4 mg total) by mouth every 8 (eight) hours as needed for nausea or vomiting. 01/18/19   McGowen, Adrian Blackwater, MD  oxyCODONE (OXY  IR/ROXICODONE) 5 MG immediate release tablet 1-2 tabs po q6h prn pain Patient taking differently: 5-10 mg every 6 (six) hours as needed for severe pain.  10/17/18   McGowen, Adrian Blackwater, MD  polyvinyl alcohol (LIQUIFILM TEARS) 1.4 % ophthalmic solution Place 1-2 drops into both eyes daily as needed for dry eyes.     [provider]  pravastatin (PRAVACHOL) 40 MG tablet TAKE 1 TABLET BY MOUTH  EVERY EVENING Patient taking differently: Take 40 mg by mouth at bedtime.  03/09/18   McGowen, Adrian Blackwater, MD  sevelamer carbonate (RENVELA) 800 MG tablet Take 800-2,400 mg by mouth See admin instructions. Take two - three tablets (1600 mg - 2400 mg) by mouth up to three times daily with meals, take one tablet (800 mg) with snacks    [provider]  vitamin C (ASCORBIC ACID) 500 MG tablet Take 500 mg by mouth daily.    [provider]  Vitamin D, Ergocalciferol, (DRISDOL) 1.25 MG (50000 UT) CAPS capsule Take 1 capsule (50,000 Units total) by mouth every Thursday. 04/28/18   McGowen, Adrian Blackwater, MD    Allergies  Allergen Reactions  . Clonidine Derivatives Palpitations and Other (See Comments)    VERY SEDATED  . Adhesive [Tape] Other (See Comments)    Tears skin up  . Codeine Nausea Only  . Nickel Rash  . Sulfa Antibiotics Other (See Comments)    Reaction unknown Did not feel well when taken Reaction unknown    Physical Exam  Vitals  Blood pressure (!) 153/64, pulse 80, resp. rate 18, SpO2 99 %. General appearance, chronically ill, in moderate respiratory distress HEENT no jaundice or pallor, no facial deviation oral thrush Neck supple, no neck vein distention Chest decreased breath sounds at the bases Heart normal S1-S2, significant for murmurs Abdomen soft, nontender, bowel sounds present Extremities no clubbing or cyanosis +1 edema in the right lower extremity Skin no rashes or ulcers    Data Review  CBC Recent Labs  Lab 03/24/19 1040 03/24/19 1050  WBC 7.0  --    HGB 10.4* 11.2*  HCT 34.3* 33.0*  PLT 244  --   MCV 97.4  --   MCH 29.5  --   MCHC 30.3  --   RDW 18.0*  --   LYMPHSABS 0.8  --   MONOABS 1.1*  --   EOSABS 0.2  --   BASOSABS 0.0  --    ------------------------------------------------------------------------------------------------------------------  Chemistries  Recent Labs  Lab 03/24/19 1040 03/24/19 1050  NA 137 135  K 5.0 4.8  CL 95* 101  CO2 24  --   GLUCOSE 94 88  BUN 40* 47*  CREATININE 6.88* 7.40*  CALCIUM 8.0*  --    ------------------------------------------------------------------------------------------------------------------ CrCl cannot be calculated (Unknown ideal weight.). ------------------------------------------------------------------------------------------------------------------ No results for input(s): TSH, T4TOTAL, T3FREE, THYROIDAB in the last 72 hours.  Invalid input(s): FREET3   Coagulation profile No results for input(s): INR, PROTIME in the last 168 hours. ------------------------------------------------------------------------------------------------------------------- No results for input(s): DDIMER in the last 72 hours. -------------------------------------------------------------------------------------------------------------------  Cardiac Enzymes No results for input(s): CKMB, TROPONINI, MYOGLOBIN in the last 168 hours.  Invalid input(s): CK ------------------------------------------------------------------------------------------------------------------ Invalid input(s): POCBNP   ---------------------------------------------------------------------------------------------------------------  Urinalysis    Component Value Date/Time  COLORURINE YELLOW 01/20/2017 [place] 01/20/2017 1737   LABSPEC 1.012 01/20/2017 1737   PHURINE 5.0 01/20/2017 1737   GLUCOSEU NEGATIVE 01/20/2017 1737   HGBUR NEGATIVE 01/20/2017 1737   BILIRUBINUR NEGATIVE 01/20/2017  1737   BILIRUBINUR negative 09/24/2016 [place] 01/20/2017 1737   PROTEINUR 30 (A) 01/20/2017 1737   UROBILINOGEN 0.2 09/24/2016 1453   NITRITE NEGATIVE 01/20/2017 1737   LEUKOCYTESUR NEGATIVE 01/20/2017 1737    ----------------------------------------------------------------------------------------------------------------    Imaging results:   Dg Chest Portable 1 View  Result Date: 03/24/2019 CLINICAL DATA:  Shortness of breath. EXAM: PORTABLE CHEST 1 VIEW COMPARISON:  10/15/2018 FINDINGS: Normal sized heart. Interval patchy opacity at the left lung base. No significant change in a probable small left pleural effusion. The right lung remains clear. Stable mild diffuse prominence of the interstitial markings, left subclavian pacemaker leads and thoracic vertebral kyphoplasty material. Diffuse osteopenia. IMPRESSION: 1. Interval patchy atelectasis or pneumonia at the left lung base. 2. Stable probable small left pleural effusion. 3. Stable mild chronic interstitial lung disease. Electronically Signed   By: Claudie Revering M.D.   On: 03/24/2019 11:04    My personal review of EKG: Rhythm NSR, right axis deviation, right bundle branch block at 83 bpm, nonspecific ST and T wave changes    Assessment & Plan  Hypoxemia Probably from dialysis that has been held for a few days to AV fistula clot. Will need hemodialysis today Continue with nasal cannula at this time Awaiting for COVID-19 test  ESRD For hemodialysis today  A. Fib Heart rate controlled and now in NSR On aspirin Continue with amiodarone/Coreg  Hypertension Continue with Coreg  Combined congestive heart failure Last echocardiogram on 7/19 Continue with hemodialysis for fluid management     DVT Prophylaxis Heparin  AM Labs Ordered, also please review Full Orders  Family Communication: D/W [name] , her daughter  Code Status full  Disposition Plan: Home  Time spent in minutes : 43  minutes  Condition GUARDED   @SIGNATURE @

## 2019-03-24 NOTE — ED Notes (Signed)
Report given to 50M

## 2019-03-24 NOTE — Progress Notes (Signed)
NEW ADMISSION NOTE New Admission Note:   Arrival Method: stretcher from ED Mental Orientation: alert and oriented x4  Telemetry: box 03, paced  Assessment: Completed Skin: intact and dry  IV: LW SNL Pain: 0 Safety Measures: Safety Fall Prevention Plan has been given, discussed and signed Admission: Completed [facility] Orientation: Patient has been orientated to the room, unit and staff.  Family: NA  Orders have been reviewed and implemented. Will continue to monitor the patient. Call light has been placed within reach and bed alarm has been activated.   Baldo Ash, RN

## 2019-03-24 NOTE — ED Notes (Signed)
Called dialysis to inquire about possibility of patient coming to unit soon. Was informed they would be waiting for covid test dialysis made aware we are no longer doing rapid tests and this was a 6-24hr covid test. Dialysis took my number and states will call back.

## 2019-03-24 NOTE — Consult Note (Signed)
[facility] Renal Consultation Note    Indication for Consultation:  Management of ESRD/hemodialysis; anemia, hypertension/volume and secondary hyperparathyroidism  HPI: Valerie Terrell is a 85 y.o. female with ESRD on HD, HTN, COPD, Hx sick sinus syndrome (s/p pacemaker), dHF, Hx SAH, Hx spinal compression Fx, paroxysmal Afib.  Admitted with respiratory distress in the setting of missed dialysis. She missed dialysis Saturday 10/31 d/t pain. Her RUE AVG clotted on Monday.  She did undergo declot at [facility] on Tues afternoon and got 3 hours of dialysis.  Unfortunately, graft was clotted again on Thursday and she did not receive dialysis. Did undergo successful declot today but was sent to ED in respiratory distress. She usually leaves over her dry weight as she self limits UF during HD. On Thursday she was 4.4kg over her dry weight.   VS in ED reviewed. SpO2 97% NRB BP 143/66 Tachypneic RR.  CXR shows patchy opacity in left lung base and small pleural effusion. Labs:  Na 135, K 4.8, BNP 2088, WBC 7.0, Hgb 11.2.   Plan for urgent HD this afternoon  Pt seen in the ED and reports that she is still a little short of breath but is feeling better.  Now on nasal canula with Pox 99%.  She does have some DOE while moving in the bed but doing better.  She admits that she didn't feel well so skipped HD on Saturday 03/18/19 and only ran 3 hours on Tuesday but clotted AVG on Thursday.  So has only had 3 hours of dialysis this week.     Past Medical History:  Diagnosis Date  . Anemia of chronic disease 2018   + anemia of CRI?  [name] Atrophy of left kidney    with absent blood flow by renal artery dopplers (Dr. Gwenlyn Found)  . 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 Chronic combined systolic and diastolic CHF (congestive heart failure) ([place])    a. 11/2016: echo showing EF of 35-40%, RV strain noted, mild MR and mild TR.   Echo showed much improved EF 12/2017 (55-60%)  . Chronic respiratory failure  with hypoxia ([place]) 02/11/2017   Now on chronic O2 (intermittent as of summer 2019).    . Closed fracture of right superior pubic ramus ([place]) 07/18/2018   Non op mgmt (Dr. Victorino December was ortho who saw her in hosp but i don't think pt followed up with him).  . Closed right hip fracture ([place]) 10/04/2018   ORIF  . Debilitated patient    WC dependent as of 2018.  Hosp bed + full assistance with most ADLs s/p hip fx surg 09/2018.  [name] ESRD on hemodialysis ([place]) 01/2017   T/Th/Sat schedule- [place].  Right basilic AV fistula [id number].  Graft 2019.  [name] History of adenomatous polyp of colon   . History of kidney stones   . History of subarachnoid hemorrhage 10/2014   after syncope and while on xarelto  . Hyperlipidemia   . Hypertension    Difficult to control, in the setting of one functioning kidney: pt was referred to nephrology by Dr. Gwenlyn Found 06/2015.  . Lumbar radiculopathy 2012  . Lumbar spinal stenosis 2019   facet injections only very short term relief.  L1 and L2 selective nerve root blocks helpful 04/2018.  . Lumbar spondylosis    MR 07/2016---no sign of spinal nerve compression or cord compression.  Pt set up with outpt ortho while admitted to hosp 07/2016.  . Malnourished ([place])   .  Metatarsal fracture 06/10/2016   Nondisplaced, left 5th metatarsal--pt was referred to ortho  . Osteopenia 2014   T-score -2.1  . PAF (paroxysmal atrial fibrillation) (H[facility])    Eliquis started after BRAO and CVA.  She was changed to warfarin 2018. All anticoag stopped due to recurrent falls.  . Presence of permanent cardiac pacemaker   . Right rib fracture 12/2016   s/p fall  . Sick sinus syndrome ([place]) 11/2016   Dual chamber pacer insertion 2018 (Dr. Lovena Le)  . Stroke ([place]) 0000000   cardioembolic (had CVA while on no anticoag)--"scattered subacute punctate infarcts: 1 in R parietal lobe and 2 in occipital cortex" on MRI br.  CT angio head/neck: aortic arch athero.  R ICA 20% stenosis, L ICA w/out any  stenosis.  . Thoracic back pain 01/2017; 01/2018   2018: Facet?  Dr. Ramos->steroid injection.  01/2018-->CT T spine to check for a new comp fx-->none found.  Selective nerve root inj in T spine helpful on R side.   . Thoracic compression fracture ([place]) 07/2016   T3.  T7 and T8-- T8 kyphoplasty during hosp admission 07/2016.  Neuro referred pt to pain mgmt for consideration of injection 09/2016.  I referred her to endo 08/2016 for consideration of calcitonin treatment.   Past Surgical History:  Procedure Laterality Date  . APPENDECTOMY  child  . AV FISTULA PLACEMENT Right 03/31/2017   Procedure: Right ARM Basilic vein transposition;  Surgeon: Conrad Clay Center, MD;  [facility];  Service: Vascular;  Laterality: Right;  . AV FISTULA PLACEMENT Right 12/06/2017   Procedure: INSERTION OF ARTERIOVENOUS (AV) GORE-TEX GRAFT ARM RIGHT UPPER EXTREMITY;  Surgeon: [name], MD;  Location: [facility];  Service: Vascular;  Laterality: Right;  . CARDIOVASCULAR STRESS TEST  01/31/2016   Stress myoview: NORMAL/Low risk.  EF 56%.  . [place]  . COLONOSCOPY  2015   + hx of adenomatous polyps.  Need digest health spec in [facility] records to see when pt due for next colonoscopy  . EYE SURGERY Bilateral    Catarct  . INSERTION OF DIALYSIS CATHETER Right 01/29/2017   Procedure: INSERTION OF DIALYSIS CATHETER- RIGHT INTERNAL JUGULAR;  Surgeon: [name], MD;  Location: [place];  Service: Vascular;  Laterality: Right;  . INTRAMEDULLARY (IM) NAIL INTERTROCHANTERIC Right 10/04/2018   Procedure: INTRAMEDULLARY (IM) NAIL INTERTROCHANTRIC;  Surgeon: Nicholes Stairs, MD;  Location: [facility];  Service: Orthopedics;  Laterality: Right;  . IR GENERIC HISTORICAL  07/24/2016   IR KYPHO THORACIC WITH BONE BIOPSY 07/24/2016 Luanne Bras, MD [facility]-INTERV RAD  . KYPHOPLASTY  07/27/2016   T8  . PACEMAKER IMPLANT N/A 12/03/2016   Procedure: Pacemaker Implant;  Surgeon: Evans Lance, MD;  Location: [facility] CV LAB;   Service: Cardiovascular;  Laterality: N/A;  . Renal artery dopplers  02/26/2016   Her right renal dimension was 11 cm pole to pole with mild to moderate right renal artery stenosis. A right renal aortic ratio was 3.22 suggesting less than a 50% stenosis.  . Right upper arm AV Gore-Tex graft  12/06/2017   Dr. Donnetta Hutching  . TONSILLECTOMY    . TRANSTHORACIC ECHOCARDIOGRAM  01/29/2016; 11/2016; 12/2017   2017: EF 55-60%, normal LV wall motion, grade I DD.  No cardiac source of emboli was seen.  2018: EF 35-40%, could not assess DD due to a-fib, pulm HTN noted. 12/2017 EF 55-60%, nl wall motion, grd II DD, mod MR and pulm regurg, increased pulm pressure (peak  1).   Family History  Problem Relation Age of Onset  . Cancer Mother   . Heart disease Father   . Early death Father   . Sudden Cardiac Death Neg Hx    Social History:  reports that she quit smoking about 47 years ago. She quit after 22.00 years of use. She has never used smokeless tobacco. She reports current alcohol use of about 1.0 standard drinks of alcohol per week. She reports that she does not use drugs. Allergies  Allergen Reactions  . Clonidine Derivatives Palpitations and Other (See Comments)    VERY SEDATED  . Adhesive [Tape] Other (See Comments)    Tears skin up  . Codeine Nausea Only  . Nickel Rash  . Sulfa Antibiotics Other (See Comments)    Reaction unknown Did not feel well when taken Reaction unknown   Prior to Admission medications   Medication Sig Start Date End Date Taking? Authorizing Provider  acetaminophen (TYLENOL) 500 MG tablet Take 500-1,000 mg 3 (three) times daily as needed by mouth for moderate pain.     [provider]  amiodarone (PACERONE) 200 MG tablet TAKE 1 TABLET BY MOUTH  DAILY Patient taking differently: Take 200 mg by mouth See admin instructions. Take one tablet (200 mg) by mouth on Sunday, Monday, Wednesday, Friday mornings, take one tablet (200 mg) on Tuesday, Thursday, Saturday after  dialysis 04/11/18   Lorretta Harp, MD  aspirin EC 325 MG tablet Take 325 mg by mouth every evening.    [provider]  b complex-vitamin c-folic acid (NEPHRO-VITE) 0.8 MG TABS tablet Take 1 tablet by mouth See admin instructions. Take one tablet by mouth on Tuesday, Thursday, Saturday - after dialysis 09/07/18   [provider]  carvedilol (COREG) 3.125 MG tablet TAKE 1 TABLET BY MOUTH TWO  TIMES DAILY Patient taking differently: Take 3.125 mg by mouth 2 (two) times daily as needed (SBP >120).  06/27/18   Lorretta Harp, MD  docusate sodium (COLACE) 100 MG capsule Take 100 mg by mouth at bedtime.     [provider]  heparin 1000 UNIT/ML injection Heparin Sodium (Porcine) 1,000 Units/mL Systemic 02/07/19 02/06/20  [provider]  isosorbide mononitrate (IMDUR) 60 MG 24 hr tablet TAKE 1 TABLET BY MOUTH  DAILY Patient taking differently: Take 60 mg by mouth daily. Take one tablet (60 mg) by mouth on Sunday, Monday, Wednesday, Friday mornings, take one tablet (60 mg) on Tuesday, Thursday, Saturday after dialysis 10/31/18   McGowen, Adrian Blackwater, MD  lidocaine-prilocaine (EMLA) cream Apply 1 application topically See admin instructions. Apply small amount to access site (AVF) one hour before dialysis, cover with occlusive dressing (saran wrap) 11/11/18   [provider]  Multiple Vitamins-Minerals (MULTIVITAMIN WITH MINERALS) tablet Take 1 tablet by mouth at bedtime.     [provider]  ondansetron (ZOFRAN) 4 MG tablet Take 1 tablet (4 mg total) by mouth every 8 (eight) hours as needed for nausea or vomiting. 01/18/19   McGowen, Adrian Blackwater, MD  oxyCODONE (OXY IR/ROXICODONE) 5 MG immediate release tablet 1-2 tabs po q6h prn pain Patient taking differently: 5-10 mg every 6 (six) hours as needed for severe pain.  10/17/18   McGowen, Adrian Blackwater, MD  polyvinyl alcohol (LIQUIFILM TEARS) 1.4 % ophthalmic solution Place 1-2 drops into both eyes daily as needed for dry  eyes.     [provider]  pravastatin (PRAVACHOL) 40 MG tablet TAKE 1 TABLET BY MOUTH  EVERY EVENING Patient  taking differently: Take 40 mg by mouth at bedtime.  03/09/18   McGowen, Adrian Blackwater, MD  sevelamer carbonate (RENVELA) 800 MG tablet Take 800-2,400 mg by mouth See admin instructions. Take two - three tablets (1600 mg - 2400 mg) by mouth up to three times daily with meals, take one tablet (800 mg) with snacks    [provider]  vitamin C (ASCORBIC ACID) 500 MG tablet Take 500 mg by mouth daily.    [provider]  Vitamin D, Ergocalciferol, (DRISDOL) 1.25 MG (50000 UT) CAPS capsule Take 1 capsule (50,000 Units total) by mouth every Thursday. 04/28/18   McGowen, Adrian Blackwater, MD   Current Facility-Administered Medications  Medication Dose Route Frequency Provider Last Rate Last Dose  . [START ON 03/25/2019] Chlorhexidine Gluconate Cloth 2 % PADS 6 each  6 each Topical Q0600 Lynnda Child, PA-C       Current Outpatient Medications  Medication Sig Dispense Refill  . acetaminophen (TYLENOL) 500 MG tablet Take 500-1,000 mg 3 (three) times daily as needed by mouth for moderate pain.     [name] amiodarone (PACERONE) 200 MG tablet TAKE 1 TABLET BY MOUTH  DAILY (Patient taking differently: Take 200 mg by mouth See admin instructions. Take one tablet (200 mg) by mouth on Sunday, Monday, Wednesday, Friday mornings, take one tablet (200 mg) on Tuesday, Thursday, Saturday after dialysis) 90 tablet 2  . aspirin EC 325 MG tablet Take 325 mg by mouth every evening.    [name] b complex-vitamin c-folic acid (NEPHRO-VITE) 0.8 MG TABS tablet Take 1 tablet by mouth See admin instructions. Take one tablet by mouth on Tuesday, Thursday, Saturday - after dialysis    . carvedilol (COREG) 3.125 MG tablet TAKE 1 TABLET BY MOUTH TWO  TIMES DAILY (Patient taking differently: Take 3.125 mg by mouth 2 (two) times daily as needed (SBP >120). ) 180 tablet 1  . docusate sodium (COLACE) 100 MG capsule Take  100 mg by mouth at bedtime.     . heparin 1000 UNIT/ML injection Heparin Sodium (Porcine) 1,000 Units/mL Systemic    . isosorbide mononitrate (IMDUR) 60 MG 24 hr tablet TAKE 1 TABLET BY MOUTH  DAILY (Patient taking differently: Take 60 mg by mouth daily. Take one tablet (60 mg) by mouth on Sunday, Monday, Wednesday, Friday mornings, take one tablet (60 mg) on Tuesday, Thursday, Saturday after dialysis) 90 tablet 0  . lidocaine-prilocaine (EMLA) cream Apply 1 application topically See admin instructions. Apply small amount to access site (AVF) one hour before dialysis, cover with occlusive dressing (saran wrap)    . Multiple Vitamins-Minerals (MULTIVITAMIN WITH MINERALS) tablet Take 1 tablet by mouth at bedtime.     . ondansetron (ZOFRAN) 4 MG tablet Take 1 tablet (4 mg total) by mouth every 8 (eight) hours as needed for nausea or vomiting. 60 tablet 1  . oxyCODONE (OXY IR/ROXICODONE) 5 MG immediate release tablet 1-2 tabs po q6h prn pain (Patient taking differently: 5-10 mg every 6 (six) hours as needed for severe pain. ) 180 tablet 0  . polyvinyl alcohol (LIQUIFILM TEARS) 1.4 % ophthalmic solution Place 1-2 drops into both eyes daily as needed for dry eyes.     . pravastatin (PRAVACHOL) 40 MG tablet TAKE 1 TABLET BY MOUTH  EVERY EVENING (Patient taking differently: Take 40 mg by mouth at bedtime. ) 90 tablet 1  . sevelamer carbonate (RENVELA) 800 MG tablet Take 800-2,400 mg by mouth See admin instructions. Take two - three tablets (1600 mg - 2400  mg) by mouth up to three times daily with meals, take one tablet (800 mg) with snacks    . vitamin C (ASCORBIC ACID) 500 MG tablet Take 500 mg by mouth daily.    . Vitamin D, Ergocalciferol, (DRISDOL) 1.25 MG (50000 UT) CAPS capsule Take 1 capsule (50,000 Units total) by mouth every Thursday. 12 capsule 3     ROS: As per HPI otherwise negative.  Physical Exam: Vitals:   03/24/19 1100 03/24/19 1104 03/24/19 1215 03/24/19 1300  BP: (!) 130/54  (!) 153/64  (!) 143/66  Pulse: 81 80 80 76  Resp: (!) 30 (!) 36 18 17  SpO2: 98% 99% 99% 98%     General: Frail, elderly WF lying in bed wearing oxygen via [place] in NAD Head: [place]/AT Lungs: decreased BS at bases, poor inspiratory effort Heart: RRR no rub Abdomen: +BS, soft, NT/ND Lower extremities: 1+ edema of lower extremities, RUE AVG +T/B Neuro: nonfocal Dialysis Access: RUE AVG +T/B  Labs: Basic Metabolic Panel: Recent Labs  Lab 03/24/19 1040 03/24/19 1050  NA 137 135  K 5.0 4.8  CL 95* 101  CO2 24  --   GLUCOSE 94 88  BUN 40* 47*  CREATININE 6.88* 7.40*  CALCIUM 8.0*  --    Liver Function Tests: No results for input(s): AST, ALT, ALKPHOS, BILITOT, PROT, ALBUMIN in the last 168 hours. No results for input(s): LIPASE, AMYLASE in the last 168 hours. No results for input(s): AMMONIA in the last 168 hours. CBC: Recent Labs  Lab 03/24/19 1040 03/24/19 1050  WBC 7.0  --   NEUTROABS 4.9  --   HGB 10.4* 11.2*  HCT 34.3* 33.0*  MCV 97.4  --   PLT 244  --    Cardiac Enzymes: No results for input(s): CKTOTAL, CKMB, CKMBINDEX, TROPONINI in the last 168 hours. CBG: No results for input(s): GLUCAP in the last 168 hours. Iron Studies: No results for input(s): IRON, TIBC, TRANSFERRIN, FERRITIN in the last 72 hours. Studies/Results: Dg Chest Portable 1 View  Result Date: 03/24/2019 CLINICAL DATA:  Shortness of breath. EXAM: PORTABLE CHEST 1 VIEW COMPARISON:  10/15/2018 FINDINGS: Normal sized heart. Interval patchy opacity at the left lung base. No significant change in a probable small left pleural effusion. The right lung remains clear. Stable mild diffuse prominence of the interstitial markings, left subclavian pacemaker leads and thoracic vertebral kyphoplasty material. Diffuse osteopenia. IMPRESSION: 1. Interval patchy atelectasis or pneumonia at the left lung base. 2. Stable probable small left pleural effusion. 3. Stable mild chronic interstitial lung disease. Electronically Signed   By:  Claudie Revering M.D.   On: 03/24/2019 11:04    Dialysis Orders:  [facility] TTS 4h 350/600 EDW 52.2kg 2K/2Ca UFP 4 AVG Hep 1400 Hectorol 3 mcg TIW Mircera 75 mcg IV q 2 weeks (last 9/24)  Assessment/Plan: 1.  Volume overload 2/2 missed dialysis. Only received 3 hours of dialysis this week. Plan for urgent HD for volume removal, likely serial treatments. Presumably due to missed HD but need to r/o covid before we can perform HD. 2.  ESRD -  HD TTS. For HD today off schedule 3.  Hypertension/volume  - BP controlled. HD for volume today.  4.  Anemia  - Hgb 11.2 No ESA needs.  5.  Metabolic bone disease -  Continue VDRA/binders  6.  P AFib-  On amiodarone/Coreg   Lynnda Child PA-C [facility] Pager [phone] 03/24/2019, 2:54 PM    I have seen and examined this  patient and agree with plan and assessment in the above note with renal recommendations/intervention highlighted.  Will plan for HD as soon as she gets a room or her covid test comes back negative or will need 2W room if positive.  She is currently stable from a respiratory standpoint. Governor Rooks Emry Tobin,MD 03/24/2019 3:39 PM

## 2019-03-24 NOTE — ED Provider Notes (Signed)
[facility] EMERGENCY DEPARTMENT Provider Note   CSN: [id number] Arrival date & time: 03/24/19  1023     History   Chief Complaint Chief Complaint  Patient presents with  . Shortness of Breath    HPI Valerie Terrell is a 85 y.o. female.     Level 5 caveat for respiratory distress.  Patient presenting from outpatient nephrology center where she was having thrombolysis of her dialysis graft.  While she was lying down for this procedure she became acutely short of breath and was placed on nonrebreather by EMS.  No documentation of hypoxia the patient is very short of breath.  She is a Tuesday Thursday Saturday dialysis patients who has had issues with clotting her graft this week is only had a full session last Thursday, October 29.  She had a partial session on November 3 and missed dialysis on 1031 and 11 5.  States that shortness of breath is progressively worsened and became acute this morning when she was lying down during the procedure.  She denies any significant cough or fever.  No chest pain.  She has chronic right left-sided leg swelling from lesser trochanter hip fracture which is unchanged and improving.  She is no longer on anticoagulation other than aspirin.  States she makes minimal urine and does not take any diuretics at home.  The history is provided by the patient.  Shortness of Breath Associated symptoms: no abdominal pain, no fever, no rash and no vomiting     Past Medical History:  Diagnosis Date  . Anemia of chronic disease 2018   + anemia of CRI?  [name] Atrophy of left kidney    with absent blood flow by renal artery dopplers (Dr. Gwenlyn Found)  . 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 Chronic combined systolic and diastolic CHF (congestive heart failure) ([place])    a. 11/2016: echo showing EF of 35-40%, RV strain noted, mild MR and mild TR.   Echo showed much improved EF 12/2017 (55-60%)  . Chronic respiratory failure with hypoxia ([place])  02/11/2017   Now on chronic O2 (intermittent as of summer 2019).    . Closed fracture of right superior pubic ramus ([place]) 07/18/2018   Non op mgmt (Dr. Victorino December was ortho who saw her in hosp but i don't think pt followed up with him).  . Closed right hip fracture ([place]) 10/04/2018   ORIF  . Debilitated patient    WC dependent as of 2018.  Hosp bed + full assistance with most ADLs s/p hip fx surg 09/2018.  [name] ESRD on hemodialysis ([place]) 01/2017   T/Th/Sat schedule- [facility].  Right basilic AV fistula [id number].  Graft 2019.  [name] History of adenomatous polyp of colon   . History of kidney stones   . History of subarachnoid hemorrhage 10/2014   after syncope and while on xarelto  . Hyperlipidemia   . Hypertension    Difficult to control, in the setting of one functioning kidney: pt was referred to nephrology by Dr. Gwenlyn Found 06/2015.  . Lumbar radiculopathy 2012  . Lumbar spinal stenosis 2019   facet injections only very short term relief.  L1 and L2 selective nerve root blocks helpful 04/2018.  . Lumbar spondylosis    MR 07/2016---no sign of spinal nerve compression or cord compression.  Pt set up with outpt ortho while admitted to hosp 07/2016.  . Malnourished ([place])   . Metatarsal fracture 06/10/2016   Nondisplaced, left 5th metatarsal--pt was  referred to ortho  . Osteopenia 2014   T-score -2.1  . PAF (paroxysmal atrial fibrillation) (HCC)    Eliquis started after BRAO and CVA.  She was changed to warfarin 2018. All anticoag stopped due to recurrent falls.  . Presence of permanent cardiac pacemaker   . Right rib fracture 12/2016   s/p fall  . Sick sinus syndrome ([place]) 11/2016   Dual chamber pacer insertion 2018 (Dr. Lovena Le)  . Stroke ([place]) 0000000   cardioembolic (had CVA while on no anticoag)--"scattered subacute punctate infarcts: 1 in R parietal lobe and 2 in occipital cortex" on MRI br.  CT angio head/neck: aortic arch athero.  R ICA 20% stenosis, L ICA w/out any stenosis.  .  Thoracic back pain 01/2017; 01/2018   2018: Facet?  Dr. Ramos->steroid injection.  01/2018-->CT T spine to check for a new comp fx-->none found.  Selective nerve root inj in T spine helpful on R side.   . Thoracic compression fracture ([place]) 07/2016   T3.  T7 and T8-- T8 kyphoplasty during hosp admission 07/2016.  Neuro referred pt to pain mgmt for consideration of injection 09/2016.  I referred her to endo 08/2016 for consideration of calcitonin treatment.    Patient Active Problem List   Diagnosis Date Noted  . Heterotopic ossification 12/29/2018  . Hip fracture ([place]) 10/04/2018  . Fracture of pubic ramus ([place]) 07/19/2018  . Fracture of sacrum ([place]) 07/19/2018  . Perineal hematoma 07/19/2018  . Fever 07/19/2018  . Acute blood loss anemia 07/19/2018  . ESRD (end stage renal disease) on dialysis ([place]) 05/20/2018  . Chronic back pain 05/14/2018  . Chronic pain syndrome 05/14/2018  . Hypoxia 12/14/2017  . Dyspnea 12/14/2017  . Degeneration of thoracic intervertebral disc 07/26/2017  . Acute respiratory failure ([place]) 02/13/2017  . Hyperkalemia 02/13/2017  . Chronic respiratory failure with hypoxia ([place]) 02/11/2017  . Debilitated patient 02/07/2017  . Poor tolerance for ambulation 02/07/2017  . Fracture of one rib, right side, initial encounter for closed fracture 01/11/2017  . Closed fracture of one rib of right side   . Chronic combined systolic and diastolic heart failure ([place]) 01/05/2017  . Palpitations 12/15/2016  . Hypokalemia 12/14/2016  . Leukocytosis 12/14/2016  . Long term (current) use of anticoagulants [Z79.01] 12/11/2016  . Non-ischemic cardiomyopathy ([place]) 12/07/2016  . Acute combined systolic and diastolic heart failure ([place])  12/07/2016  . Cardiac pacemaker   . Acute on chronic respiratory failure with hypoxia ([place])   . Acute pulmonary edema with congestive heart failure ([place]) 11/30/2016  . Elevated troponin level 11/30/2016  . Anemia 11/30/2016  . Chronic midline  thoracic back pain 09/15/2016  . History of CVA (cerebrovascular accident) 09/15/2016  . Hyperlipidemia   . Nausea & vomiting 08/05/2016  . Dehydration 08/05/2016  . Osteoporosis 07/28/2016  . Closed compression fracture of thoracic vertebra ([place]) 07/23/2016  . Thoracic compression fracture ([place]) 07/22/2016  . Flank pain 07/11/2016  . Hypoalbuminemia 07/11/2016  . Renal artery stenosis ([place]) 06/24/2016  . Nondisplaced fracture of fifth metatarsal bone, left foot, initial encounter for closed fracture 06/10/2016  . Rib contusion, left, initial encounter 06/10/2016  . Single kidney 03/13/2016  . Chest pain   . Dyslipidemia   . PAF (paroxysmal atrial fibrillation) ([place]) 01/28/2016  . Essential hypertension 01/28/2016  . History of cardioembolic stroke [id number]  . Personal history of subarachnoid hemorrhage 01/28/2016  . CKD (chronic kidney disease), stage IV ([place]) 01/28/2016  . Gait disturbance 01/27/2016  . Mitral valve disease 01/08/2014  .  Colon polyps 07/10/2013  . Lumbar radiculopathy 02/04/2011    Past Surgical History:  Procedure Laterality Date  . APPENDECTOMY  child  . AV FISTULA PLACEMENT Right 03/31/2017   Procedure: Right ARM Basilic vein transposition;  Surgeon: Conrad Ward, MD;  Location[facility] OR;  Service: Vascular;  Laterality: Right;  . AV FISTULA PLACEMENT Right 12/06/2017   Procedure: INSERTION OF ARTERIOVENOUS (AV) GORE-TEX GRAFT ARM RIGHT UPPER EXTREMITY;  Surgeon: [name], MD;  Location: [facility];  Service: Vascular;  Laterality: Right;  . CARDIOVASCULAR STRESS TEST  01/31/2016   Stress myoview: NORMAL/Low risk.  EF 56%.  . [place]  . COLONOSCOPY  2015   + hx of adenomatous polyps.  Need digest health spec in [facility] records to see when pt due for next colonoscopy  . EYE SURGERY Bilateral    Catarct  . INSERTION OF DIALYSIS CATHETER Right 01/29/2017   Procedure: INSERTION OF DIALYSIS CATHETER- RIGHT INTERNAL JUGULAR;  Surgeon: [name], MD;  Location: [facility];  Service: Vascular;  Laterality: Right;  . INTRAMEDULLARY (IM) NAIL INTERTROCHANTERIC Right 10/04/2018   Procedure: INTRAMEDULLARY (IM) NAIL INTERTROCHANTRIC;  Surgeon: Nicholes Stairs, MD;  Location: [facility];  Service: Orthopedics;  Laterality: Right;  . IR GENERIC HISTORICAL  07/24/2016   IR KYPHO THORACIC WITH BONE BIOPSY 07/24/2016 Luanne Bras, MD [facility]INTER[facility] RAD  . KYPHOPLASTY  07/27/2016   T8  . PACEMAKER IMPLANT N/A 12/03/2016   Procedure: Pacemaker Implant;  Surgeon: Evans Lance, MD;  Location: [facility] LAB;  Service: Cardiovascular;  Laterality: N/A;  . Renal artery dopplers  02/26/2016   Her right renal dimension was 11 cm pole to pole with mild to moderate right renal artery stenosis. A right renal aortic ratio was 3.22 suggesting less than a 50% stenosis.  . Right upper arm AV Gore-Tex graft  12/06/2017   Dr. Donnetta Hutching  . TONSILLECTOMY    . TRANSTHORACIC ECHOCARDIOGRAM  01/29/2016; 11/2016; 12/2017   2017: EF 55-60%, normal LV wall motion, grade I DD.  No cardiac source of emboli was seen.  2018: EF 35-40%, could not assess DD due to a-fib, pulm HTN noted. 12/2017 EF 55-60%, nl wall motion, grd II DD, mod MR and pulm regurg, increased pulm pressure (peak 52).     OB History   No obstetric history on file.      Home Medications    Prior to Admission medications   Medication Sig Start Date End Date Taking? Authorizing Provider  acetaminophen (TYLENOL) 500 MG tablet Take 500-1,000 mg 3 (three) times daily as needed by mouth for moderate pain.     [provider]  amiodarone (PACERONE) 200 MG tablet TAKE 1 TABLET BY MOUTH  DAILY Patient taking differently: Take 200 mg by mouth See admin instructions. Take one tablet (200 mg) by mouth on Sunday, Monday, Wednesday, Friday mornings, take one tablet (200 mg) on Tuesday, Thursday, Saturday after dialysis 04/11/18   Lorretta Harp, MD  aspirin EC 325 MG tablet Take 325 mg by mouth every  evening.    [provider]  b complex-vitamin c-folic acid (NEPHRO-VITE) 0.8 MG TABS tablet Take 1 tablet by mouth See admin instructions. Take one tablet by mouth on Tuesday, Thursday, Saturday - after dialysis 09/07/18   [provider]  carvedilol (COREG) 3.125 MG tablet TAKE 1 TABLET BY MOUTH TWO  TIMES DAILY Patient taking differently: Take 3.125 mg by mouth 2 (two) times daily as needed (SBP >120).  06/27/18   Lorretta Harp, MD  docusate sodium (COLACE) 100 MG capsule Take 100 mg by mouth at bedtime.     [provider]  heparin 1000 UNIT/ML injection Heparin Sodium (Porcine) 1,000 Units/mL Systemic 02/07/19 02/06/20  [provider]  isosorbide mononitrate (IMDUR) 60 MG 24 hr tablet TAKE 1 TABLET BY MOUTH  DAILY Patient taking differently: Take 60 mg by mouth daily. Take one tablet (60 mg) by mouth on Sunday, Monday, Wednesday, Friday mornings, take one tablet (60 mg) on Tuesday, Thursday, Saturday after dialysis 10/31/18   McGowen, Adrian Blackwater, MD  lidocaine-prilocaine (EMLA) cream Apply 1 application topically See admin instructions. Apply small amount to access site (AVF) one hour before dialysis, cover with occlusive dressing (saran wrap) 11/11/18   [provider]  Multiple Vitamins-Minerals (MULTIVITAMIN WITH MINERALS) tablet Take 1 tablet by mouth at bedtime.     [provider]  ondansetron (ZOFRAN) 4 MG tablet Take 1 tablet (4 mg total) by mouth every 8 (eight) hours as needed for nausea or vomiting. 01/18/19   McGowen, Adrian Blackwater, MD  oxyCODONE (OXY IR/ROXICODONE) 5 MG immediate release tablet 1-2 tabs po q6h prn pain Patient taking differently: 5-10 mg every 6 (six) hours as needed for severe pain.  10/17/18   McGowen, Adrian Blackwater, MD  polyvinyl alcohol (LIQUIFILM TEARS) 1.4 % ophthalmic solution Place 1-2 drops into both eyes daily as needed for dry eyes.     [provider]  pravastatin (PRAVACHOL) 40 MG tablet TAKE 1 TABLET BY MOUTH   EVERY EVENING Patient taking differently: Take 40 mg by mouth at bedtime.  03/09/18   McGowen, Adrian Blackwater, MD  sevelamer carbonate (RENVELA) 800 MG tablet Take 800-2,400 mg by mouth See admin instructions. Take two - three tablets (1600 mg - 2400 mg) by mouth up to three times daily with meals, take one tablet (800 mg) with snacks    [provider]  vitamin C (ASCORBIC ACID) 500 MG tablet Take 500 mg by mouth daily.    [provider]  Vitamin D, Ergocalciferol, (DRISDOL) 1.25 MG (50000 UT) CAPS capsule Take 1 capsule (50,000 Units total) by mouth every Thursday. 04/28/18   McGowen, Adrian Blackwater, MD    Family History Family History  Problem Relation Age of Onset  . Cancer Mother   . Heart disease Father   . Early death Father   . Sudden Cardiac Death Neg Hx     Social History Social History   Tobacco Use  . Smoking status: Former Smoker    Years: 22.00    Quit date: 03/17/1972    Years since quitting: 47.0  . Smokeless tobacco: Never Used  Substance Use Topics  . Alcohol use: Yes    Alcohol/week: 1.0 standard drinks    Types: 1 Glasses of wine per week    Comment: wine  . Drug use: No     Allergies   Clonidine derivatives, Adhesive [tape], Codeine, Nickel, and Sulfa antibiotics   Review of Systems Review of Systems  Constitutional: Negative for fever.  HENT: Negative for congestion and rhinorrhea.   Respiratory: Positive for shortness of breath.   Cardiovascular: Positive for leg swelling.  Gastrointestinal: Negative for abdominal pain, nausea and vomiting.  Genitourinary: Negative for dysuria and hematuria.  Skin: Negative for rash.     Physical Exam Updated Vital Signs BP (!) 163/78   Pulse 84   Resp 20   LMP  (LMP Unknown)   SpO2 97%   Physical  Exam Vitals signs and nursing note reviewed.  Constitutional:      General: She is in acute distress.     Appearance: She is well-developed.     Comments: Tachypneic, tripoding, moderate  respiratory distress  HENT:     Head: Normocephalic and atraumatic.     Mouth/Throat:     Pharynx: No oropharyngeal exudate.  Eyes:     Conjunctiva/sclera: Conjunctivae normal.     Pupils: Pupils are equal, round, and reactive to light.  Neck:     Musculoskeletal: Normal range of motion and neck supple.     Comments: No meningismus. Cardiovascular:     Rate and Rhythm: Normal rate and regular rhythm.     Heart sounds: Normal heart sounds. No murmur.     Comments: Pacemaker in place Pulmonary:     Effort: Pulmonary effort is normal. No respiratory distress.     Breath sounds: Rales present.     Comments: Bibasilar crackles Abdominal:     Palpations: Abdomen is soft.     Tenderness: There is no abdominal tenderness. There is no guarding or rebound.  Musculoskeletal: Normal range of motion.        General: No tenderness.     Right lower leg: Edema present.     Comments: Dialysis graft right upper extremity with palpable thrill and bruit  Intact DP and PT pulses  Skin:    General: Skin is warm.  Neurological:     Mental Status: She is alert and oriented to person, place, and time.     Cranial Nerves: No cranial nerve deficit.     Motor: No abnormal muscle tone.     Coordination: Coordination normal.     Comments:  5/5 strength throughout. CN 2-12 intact.Equal grip strength.   Psychiatric:        Behavior: Behavior normal.      ED Treatments / Results  Labs (all labs ordered are listed, but only abnormal results are displayed) Labs Reviewed  CBC WITH DIFFERENTIAL/PLATELET - Abnormal; Notable for the following components:      Result Value   RBC 3.52 (*)    Hemoglobin 10.4 (*)    HCT 34.3 (*)    RDW 18.0 (*)    Monocytes Absolute 1.1 (*)    Abs Immature Granulocytes 0.08 (*)    All other components within normal limits  BASIC METABOLIC PANEL - Abnormal; Notable for the following components:   Chloride 95 (*)    BUN 40 (*)    Creatinine, Ser 6.88 (*)    Calcium 8.0  (*)    GFR calc non Af Amer 5 (*)    GFR calc Af Amer 6 (*)    Anion gap 18 (*)    All other components within normal limits  BRAIN NATRIURETIC PEPTIDE - Abnormal; Notable for the following components:   B Natriuretic Peptide 2,088.4 (*)    All other components within normal limits  I-STAT CHEM 8, ED - Abnormal; Notable for the following components:   BUN 47 (*)    Creatinine, Ser 7.40 (*)    Calcium, Ion 0.89 (*)    Hemoglobin 11.2 (*)    HCT 33.0 (*)    All other components within normal limits  TROPONIN I (HIGH SENSITIVITY) - Abnormal; Notable for the following components:   Troponin I (High Sensitivity) 35 (*)    All other components within normal limits  TROPONIN I (HIGH SENSITIVITY) - Abnormal; Notable for the following components:   Troponin I (  High Sensitivity) 53 (*)    All other components within normal limits  SARS CORONAVIRUS 2 (TAT 6-24 HRS)    EKG EKG Interpretation  Date/Time:  Friday March 24 2019 10:27:28 EST Ventricular Rate:  83 PR Interval:    QRS Duration: 107 QT Interval:  442 QTC Calculation: 520 R Axis:   110 Text Interpretation: Sinus rhythm Right axis deviation Nonspecific T abnrm, anterolateral leads Prolonged QT interval No significant change was found Confirmed by Ezequiel Essex [phone]) on 03/24/2019 10:41:03 AM   Radiology No results found.  Procedures .Critical Care Performed by: Ezequiel Essex, MD Authorized by: Ezequiel Essex, MD   Critical care provider statement:    Critical care time (minutes):  45   Critical care was necessary to treat or prevent imminent or life-threatening deterioration of the following conditions:  Respiratory failure and metabolic crisis   Critical care was time spent personally by me on the following activities:  Discussions with consultants, evaluation of patient's response to treatment, examination of patient, ordering and performing treatments and interventions, ordering and review of laboratory  studies, ordering and review of radiographic studies, pulse oximetry, re-evaluation of patient's condition, obtaining history from patient or surrogate and review of old charts   (including critical care time)  Medications Ordered in ED Medications - No data to display   Initial Impression / Assessment and Plan / ED Course  I have reviewed the triage vital signs and the nursing notes.  Pertinent labs & imaging results that were available during my care of the patient were reviewed by me and considered in my medical decision making (see chart for details).       Respiratory distress likely secondary to volume overload and missed dialysis sessions.  EKG shows no obvious evidence of hyperkalemia.  Patient mentating well and saturating well on supplemental oxygen.  Potassium is 4.8.  Chest x-ray shows mild pleural effusions and atelectasis.  Patient with increased work of breathing and hypoxia.  She will benefit from BiPAP though coronavirus status is still unknown.  Discussed with Dr. Marval Regal nephrology who will evaluate for dialysis today  Patient's breathing has improved.  She is titrated down to nasal cannula.  Will hold on BiPAP at this time.  She will need admission for dialysis due to volume overload.  Coronavirus swab is pending.  Admission discussed with Dr. Laren Everts.  Paw Carbine was evaluated in Emergency Department on 03/24/2019 for the symptoms described in the history of present illness. She was evaluated in the context of the global COVID-19 pandemic, which necessitated consideration that the patient might be at risk for infection with the SARS-CoV-2 virus that causes COVID-19. Institutional protocols and algorithms that pertain to the evaluation of patients at risk for COVID-19 are in a state of rapid change based on information released by regulatory bodies including the CDC and federal and state organizations. These policies and algorithms were followed during the  patient's care in the ED.    Final Clinical Impressions(s) / ED Diagnoses   Final diagnoses:  Acute respiratory failure with hypoxia [facility])    ED Discharge Orders    None       Ezequiel Essex, MD 03/24/19 [place]

## 2019-03-24 NOTE — ED Triage Notes (Signed)
Pt from out patient OR for thrombolysis of dialysis graft. Graft was fixed, after surgery pt c/c of SOB. O2 95% on 12 L/min NRB. Pt missed dialysis yesterday 11/5, finished 3 hrs Tuesday 11/3, missed last Saturday 10/31.  BP 180/80 HR 84 RR 20 97% on 12L CBG 104 Temp 97.0

## 2019-03-24 NOTE — ED Notes (Signed)
Family at bedside. 

## 2019-03-24 NOTE — Plan of Care (Signed)
  Problem: Education: Goal: Knowledge of General Education information will improve Description Including pain rating scale, medication(s)/side effects and non-pharmacologic comfort measures Outcome: Progressing   

## 2019-03-25 DIAGNOSIS — Z950 Presence of cardiac pacemaker: Secondary | ICD-10-CM | POA: Diagnosis not present

## 2019-03-25 DIAGNOSIS — I5042 Chronic combined systolic (congestive) and diastolic (congestive) heart failure: Secondary | ICD-10-CM | POA: Diagnosis present

## 2019-03-25 DIAGNOSIS — Z992 Dependence on renal dialysis: Secondary | ICD-10-CM | POA: Diagnosis not present

## 2019-03-25 DIAGNOSIS — Z79899 Other long term (current) drug therapy: Secondary | ICD-10-CM | POA: Diagnosis not present

## 2019-03-25 DIAGNOSIS — N186 End stage renal disease: Secondary | ICD-10-CM | POA: Diagnosis not present

## 2019-03-25 DIAGNOSIS — Z7982 Long term (current) use of aspirin: Secondary | ICD-10-CM | POA: Diagnosis not present

## 2019-03-25 DIAGNOSIS — R0600 Dyspnea, unspecified: Secondary | ICD-10-CM | POA: Diagnosis not present

## 2019-03-25 DIAGNOSIS — J9621 Acute and chronic respiratory failure with hypoxia: Secondary | ICD-10-CM | POA: Diagnosis present

## 2019-03-25 DIAGNOSIS — M8580 Other specified disorders of bone density and structure, unspecified site: Secondary | ICD-10-CM | POA: Diagnosis present

## 2019-03-25 DIAGNOSIS — D638 Anemia in other chronic diseases classified elsewhere: Secondary | ICD-10-CM | POA: Diagnosis present

## 2019-03-25 DIAGNOSIS — E785 Hyperlipidemia, unspecified: Secondary | ICD-10-CM | POA: Diagnosis present

## 2019-03-25 DIAGNOSIS — I132 Hypertensive heart and chronic kidney disease with heart failure and with stage 5 chronic kidney disease, or end stage renal disease: Secondary | ICD-10-CM | POA: Diagnosis present

## 2019-03-25 DIAGNOSIS — E8770 Fluid overload, unspecified: Secondary | ICD-10-CM | POA: Diagnosis not present

## 2019-03-25 DIAGNOSIS — Z87891 Personal history of nicotine dependence: Secondary | ICD-10-CM | POA: Diagnosis not present

## 2019-03-25 DIAGNOSIS — Z20828 Contact with and (suspected) exposure to other viral communicable diseases: Secondary | ICD-10-CM | POA: Diagnosis present

## 2019-03-25 DIAGNOSIS — J449 Chronic obstructive pulmonary disease, unspecified: Secondary | ICD-10-CM | POA: Diagnosis present

## 2019-03-25 DIAGNOSIS — Z8673 Personal history of transient ischemic attack (TIA), and cerebral infarction without residual deficits: Secondary | ICD-10-CM | POA: Diagnosis not present

## 2019-03-25 DIAGNOSIS — E8889 Other specified metabolic disorders: Secondary | ICD-10-CM | POA: Diagnosis present

## 2019-03-25 DIAGNOSIS — I120 Hypertensive chronic kidney disease with stage 5 chronic kidney disease or end stage renal disease: Secondary | ICD-10-CM | POA: Diagnosis not present

## 2019-03-25 DIAGNOSIS — N2581 Secondary hyperparathyroidism of renal origin: Secondary | ICD-10-CM | POA: Diagnosis present

## 2019-03-25 DIAGNOSIS — Z9115 Patient's noncompliance with renal dialysis: Secondary | ICD-10-CM | POA: Diagnosis not present

## 2019-03-25 DIAGNOSIS — E875 Hyperkalemia: Secondary | ICD-10-CM | POA: Diagnosis not present

## 2019-03-25 DIAGNOSIS — J9601 Acute respiratory failure with hypoxia: Secondary | ICD-10-CM | POA: Diagnosis not present

## 2019-03-25 DIAGNOSIS — I480 Paroxysmal atrial fibrillation: Secondary | ICD-10-CM | POA: Diagnosis present

## 2019-03-25 LAB — RENAL FUNCTION PANEL
Albumin: 2.2 g/dL — ABNORMAL LOW (ref 3.5–5.0)
Anion gap: 14 (ref 5–15)
BUN: 34 mg/dL — ABNORMAL HIGH (ref 8–23)
CO2: 25 mmol/L (ref 22–32)
Calcium: 7.8 mg/dL — ABNORMAL LOW (ref 8.9–10.3)
Chloride: 99 mmol/L (ref 98–111)
Creatinine, Ser: 6.09 mg/dL — ABNORMAL HIGH (ref 0.44–1.00)
GFR calc Af Amer: 7 mL/min — ABNORMAL LOW (ref >60)
GFR calc non Af Amer: 6 mL/min — ABNORMAL LOW (ref >60)
Glucose, Bld: 99 mg/dL (ref 70–99)
Phosphorus: 6.7 mg/dL — ABNORMAL HIGH (ref 2.5–4.6)
Potassium: 4.6 mmol/L (ref 3.5–5.1)
Sodium: 138 mmol/L (ref 135–145)

## 2019-03-25 LAB — CBC
HCT: 27.4 % — ABNORMAL LOW (ref 36.0–46.0)
Hemoglobin: 8.8 g/dL — ABNORMAL LOW (ref 12.0–15.0)
MCH: 30.4 pg (ref 26.0–34.0)
MCHC: 32.1 g/dL (ref 30.0–36.0)
MCV: 94.8 fL (ref 80.0–100.0)
Platelets: 180 10*3/uL (ref 150–400)
RBC: 2.89 MIL/uL — ABNORMAL LOW (ref 3.87–5.11)
RDW: 17.7 % — ABNORMAL HIGH (ref 11.5–15.5)
WBC: 6.1 10*3/uL (ref 4.0–10.5)
nRBC: 0 % (ref 0.0–0.2)

## 2019-03-25 MED ORDER — SODIUM CHLORIDE 0.9 % IV SOLN: 100.0000 mL | INTRAVENOUS | Status: DC | PRN

## 2019-03-25 MED ORDER — LIDOCAINE HCL (PF) 1 % IJ SOLN: 5.0000 mL | INTRAMUSCULAR | Status: DC | PRN

## 2019-03-25 MED ORDER — PENTAFLUOROPROP-TETRAFLUOROETH EX AERO: 1.0000 "application " | INHALATION_SPRAY | CUTANEOUS | Status: DC | PRN

## 2019-03-25 MED ORDER — HEPARIN SODIUM (PORCINE) 1000 UNIT/ML DIALYSIS
20.0000 [IU]/kg | INTRAMUSCULAR | Status: DC | PRN
  Administered 2019-03-25: 1100 [IU] via INTRAVENOUS_CENTRAL
  Filled 2019-03-25: qty 1.1

## 2019-03-25 MED ORDER — HEPARIN SODIUM (PORCINE) 1000 UNIT/ML IJ SOLN
INTRAMUSCULAR | Status: AC
  Filled 2019-03-25: qty 2

## 2019-03-25 MED ORDER — HEPARIN SODIUM (PORCINE) 1000 UNIT/ML DIALYSIS: 20.0000 [IU]/kg | INTRAMUSCULAR | Status: DC | PRN

## 2019-03-25 MED ORDER — LIDOCAINE-PRILOCAINE 2.5-2.5 % EX CREA: 1.0000 "application " | TOPICAL_CREAM | CUTANEOUS | Status: DC | PRN

## 2019-03-25 MED ORDER — ALTEPLASE 2 MG IJ SOLR: 2.0000 mg | Freq: Once | INTRAMUSCULAR | Status: DC | PRN

## 2019-03-25 MED ORDER — HEPARIN SODIUM (PORCINE) 1000 UNIT/ML DIALYSIS: 1000.0000 [IU] | INTRAMUSCULAR | Status: DC | PRN

## 2019-03-25 NOTE — Progress Notes (Addendum)
[facility] Progress Note   Subjective:  Seen in room. Completed dialysis last night with 2.7L removed. Breathing improved today. For HD again today.   Objective Vitals:   03/24/19 2310 03/25/19 0440 03/25/19 0940 03/25/19 0947  BP: (!) 118/48 (!) 159/50  (!) 131/47  Pulse: 76 81  74  Resp: 15 16  18   Temp: 97.9 F (36.6 C) 98.1 F (36.7 C) 98.2 F (36.8 C) 98.8 F (37.1 C)  TempSrc: Oral Oral Oral Oral  SpO2: 98% 93%  100%  Weight: 55.7 kg       Physical Exam General: Well appearing NAD  Heart: RRR Lungs: +rales/occasional wheeze L lung base Abdomen: soft NTND Extremities: 1+ pitting edema RLE  Dialysis Access: RUE AVG bruised +bruit    Weight change:    Additional Objective Labs: Basic Metabolic Panel: Recent Labs  Lab 03/24/19 1040 03/24/19 1050  NA 137 135  K 5.0 4.8  CL 95* 101  CO2 24  --   GLUCOSE 94 88  BUN 40* 47*  CREATININE 6.88* 7.40*  CALCIUM 8.0*  --    CBC: Recent Labs  Lab 03/24/19 1040 03/24/19 1050  WBC 7.0  --   NEUTROABS 4.9  --   HGB 10.4* 11.2*  HCT 34.3* 33.0*  MCV 97.4  --   PLT 244  --    Blood Culture    Component Value Date/Time   SDES BLOOD LEFT ANTECUBITAL 07/18/2018 2250   SPECREQUEST  07/18/2018 2250    BOTTLES DRAWN AEROBIC AND ANAEROBIC Blood Culture adequate volume   CULT  07/18/2018 2250    NO GROWTH 5 DAYS Performed at [facility], [place] [address] [place], [place] [address]    REPTSTATUS 07/23/2018 FINAL 07/18/2018 2250     Medications: . sodium chloride    . sodium chloride    . sodium chloride     . amiodarone  200 mg Oral Daily  . aspirin EC  325 mg Oral QPM  . carvedilol  3.125 mg Oral BID WC  . Chlorhexidine Gluconate Cloth  6 each Topical Q0600  . docusate sodium  100 mg Oral QHS  . heparin  5,000 Units Subcutaneous Q12H  . isosorbide mononitrate  60 mg Oral Daily  . lidocaine-prilocaine  1 application Topical Q T,Th,Sa-HD  . pravastatin  40 mg Oral QPM  .  sevelamer carbonate  1,600-2,400 mg Oral TID WC  . sodium chloride flush  3 mL Intravenous Q12H    Dialysis:  [facility]  4h   350/600   52.2kg   2K/2Ca   UFP 4   L AVG Hep 1400 Hectorol 3 mcg TIW Mircera 75 mcg IV q 2 weeks (last 9/24)  Assessment/Plan: 1.  Dyspnea/Volume overload 2/2 missed dialysis. COVID negative. Missed dialysis this week d/t clotted AVG. Successful declot on 11/6. Had urgent HD yesterday for volume removal.  2.  ESRD -  HD TTS. HD 11/6 and today on schedule.  3.  Hypertension/volume  - BP controlled.Net UF 2.7L Post wt 55.7kg. Not meeting EDW as outpatient. Usually self-limits UF goals  4.  Anemia  - Hgb 11.2 No ESA needs.  5.  Metabolic bone disease -  Continue VDRA/binders  6.  P AFib-  On amiodarone/Coreg   Lynnda Child PA-C [facility] Pager [phone] 03/25/2019,10:28 AM  LOS: 0 days   Pt seen, examined and agree w A/P as above. Plan HD again today and try to get down to dry wt , ~  3kg.  May be ready for dc after HD.  Wean to RA.   Kelly Splinter  MD 03/25/2019, 1:05 PM

## 2019-03-25 NOTE — Progress Notes (Signed)
Pt refused medications. This RN has tried several times to give medications earlier and again now, explaining what each one is for, d/t daughter informing this RN that pt does "sundown." Pt is alert and oriented x 4 and states that she knows what they are and that her BP was fine and she did not want to take anything tonight. Pt stated that she was tired and she was worried about her graft clotting again. RN tried to comfort and reassure pt about her graft. Pt seemed to be at ease and stated she was ready for bed. RN will continue to monitor.   Eleanora Neighbor, RN

## 2019-03-25 NOTE — Care Management Obs Status (Signed)
[place] NOTIFICATION   Patient Details  Name: Valerie Terrell MRN: [id number] Date of Birth: 1932/10/06   Medicare Observation Status Notification Given:  Yes    Carles Collet, RN 03/25/2019, 4:12 PM

## 2019-03-25 NOTE — Care Management Obs Status (Signed)
[place] NOTIFICATION   Patient Details  Name: Valerie Terrell MRN: [id number] Date of Birth: [birth year]   Medicare Observation Status Notification Given:  Yes    Carles Collet, RN 03/25/2019, 4:05 PM

## 2019-03-25 NOTE — Progress Notes (Signed)
Progress Note    Valerie Terrell  [id number] DOB: [birth year]  DOA: 03/24/2019 PCP: Tammi Sou, MD    Brief Narrative:     Medical records reviewed and are as summarized below:  Valerie Terrell is an 85 y.o. female with a past medical history significant for ESRD sent from outpatient nephrology center after she missed few sessions of dialysis this week for shortness of breath.  Patient had issues with clotting her graft this week and she had a partial session on November 3 but missed her dialysis sessions.  Today she is having increasing shortness of breath with orthopnea  Assessment/Plan:   Active Problems:   Dyspnea   ESRD (end stage renal disease) on dialysis (HCC)   Hypoxemia   Increasing dyspnea from volume overload Acute on chronic respiratory failure Probably from volume overload from missing dialysis due to AV fistula clot. S/p HD yesterday with 2L off Plan for HD today -wean O2 as tolerated -with her marked dyspnea today not sure she will be able to wean to RA and d/c later but after HD will do home O2 sat  ESRD HD 11/6 and 11/7  A. Fib Heart rate controlled and now in NSR On aspirin Continue with amiodarone/Coreg  Hypertension Continue with Coreg   Family Communication/Anticipated D/C date and plan/Code Status   DVT prophylaxis:  Code Status: Full Code.  Family Communication: family at beside Disposition Plan: pending improvement of breathing back to baseline   Medical Consultants:    renal     Subjective:   Still short of breath  Objective:    Vitals:   03/24/19 2310 03/25/19 0440 03/25/19 0940 03/25/19 0947  BP: (!) 118/48 (!) 159/50  (!) 131/47  Pulse: 76 81  74  Resp: 15 16  18   Temp: 97.9 F (36.6 C) 98.1 F (36.7 C) 98.2 F (36.8 C) 98.8 F (37.1 C)  TempSrc: Oral Oral Oral Oral  SpO2: 98% 93%  100%  Weight: 55.7 kg       Intake/Output Summary (Last 24 hours) at 03/25/2019 1427 Last data filed at 03/25/2019  1300 Gross per 24 hour  Intake 840 ml  Output 2900 ml  Net -2060 ml   Filed Weights   03/24/19 1850 03/24/19 2310  Weight: 58.4 kg 55.7 kg    Exam: Just back from the bathroom with marked dyspnea Rales at bases rrr Pleasant and cooperative but appears winded  Data Reviewed:   I have personally reviewed following labs and imaging studies:  Labs: Labs show the following:   Basic Metabolic Panel: Recent Labs  Lab 03/24/19 1040 03/24/19 1050  NA 137 135  K 5.0 4.8  CL 95* 101  CO2 24  --   GLUCOSE 94 88  BUN 40* 47*  CREATININE 6.88* 7.40*  CALCIUM 8.0*  --    GFR Estimated Creatinine Clearance: 4.3 mL/min (A) (by C-G formula based on SCr of 7.4 mg/dL (H)). Liver Function Tests: No results for input(s): AST, ALT, ALKPHOS, BILITOT, PROT, ALBUMIN in the last 168 hours. No results for input(s): LIPASE, AMYLASE in the last 168 hours. No results for input(s): AMMONIA in the last 168 hours. Coagulation profile No results for input(s): INR, PROTIME in the last 168 hours.  CBC: Recent Labs  Lab 03/24/19 1040 03/24/19 1050  WBC 7.0  --   NEUTROABS 4.9  --   HGB 10.4* 11.2*  HCT 34.3* 33.0*  MCV 97.4  --   PLT 244  --  Cardiac Enzymes: No results for input(s): CKTOTAL, CKMB, CKMBINDEX, TROPONINI in the last 168 hours. BNP (last 3 results) No results for input(s): PROBNP in the last 8760 hours. CBG: No results for input(s): GLUCAP in the last 168 hours. D-Dimer: No results for input(s): DDIMER in the last 72 hours. Hgb A1c: No results for input(s): HGBA1C in the last 72 hours. Lipid Profile: No results for input(s): CHOL, HDL, LDLCALC, TRIG, CHOLHDL, LDLDIRECT in the last 72 hours. Thyroid function studies: No results for input(s): TSH, T4TOTAL, T3FREE, THYROIDAB in the last 72 hours.  Invalid input(s): FREET3 Anemia work up: No results for input(s): VITAMINB12, FOLATE, FERRITIN, TIBC, IRON, RETICCTPCT in the last 72 hours. Sepsis Labs: Recent Labs   Lab 03/24/19 1040  WBC 7.0    Microbiology Recent Results (from the past 240 hour(s))  SARS CORONAVIRUS 2 (TAT 6-24 HRS) Nasopharyngeal Nasopharyngeal Swab     Status: None   Collection Time: 03/24/19 10:46 AM   Specimen: Nasopharyngeal Swab  Result Value Ref Range Status   SARS Coronavirus 2 NEGATIVE NEGATIVE Final    Comment: (NOTE) SARS-CoV-2 target nucleic acids are NOT DETECTED. The SARS-CoV-2 RNA is generally detectable in upper and lower respiratory specimens during the acute phase of infection. Negative results do not preclude SARS-CoV-2 infection, do not rule out co-infections with other pathogens, and should not be used as the sole basis for treatment or other patient management decisions. Negative results must be combined with clinical observations, patient history, and epidemiological information. The expected result is Negative. Fact Sheet for Patients: SugarRoll.be Fact Sheet for Healthcare Providers: https://www.woods-mathews.com/ This test is not yet approved or cleared by the [place] FDA and  has been authorized for detection and/or diagnosis of SARS-CoV-2 by FDA under an Emergency Use Authorization (EUA). This EUA will remain  in effect (meaning this test can be used) for the duration of the COVID-19 declaration under Section 56 4(b)(1) of the Act, 21 U.S.C. section 360bbb-3(b)(1), unless the authorization is terminated or revoked sooner. Performed at [facility], [place] [address], [place], [place] [address]     Procedures and diagnostic studies:  Dg Chest Portable 1 View  Result Date: 03/24/2019 CLINICAL DATA:  Shortness of breath. EXAM: PORTABLE CHEST 1 VIEW COMPARISON:  10/15/2018 FINDINGS: Normal sized heart. Interval patchy opacity at the left lung base. No significant change in a probable small left pleural effusion. The right lung remains clear. Stable mild diffuse prominence of the interstitial  markings, left subclavian pacemaker leads and thoracic vertebral kyphoplasty material. Diffuse osteopenia. IMPRESSION: 1. Interval patchy atelectasis or pneumonia at the left lung base. 2. Stable probable small left pleural effusion. 3. Stable mild chronic interstitial lung disease. Electronically Signed   By: Claudie Revering M.D.   On: 03/24/2019 11:04    Medications:   . amiodarone  200 mg Oral Daily  . aspirin EC  325 mg Oral QPM  . carvedilol  3.125 mg Oral BID WC  . Chlorhexidine Gluconate Cloth  6 each Topical Q0600  . docusate sodium  100 mg Oral QHS  . heparin  5,000 Units Subcutaneous Q12H  . isosorbide mononitrate  60 mg Oral Daily  . lidocaine-prilocaine  1 application Topical Q T,Th,Sa-HD  . pravastatin  40 mg Oral QPM  . sevelamer carbonate  1,600-2,400 mg Oral TID WC  . sodium chloride flush  3 mL Intravenous Q12H   Continuous Infusions: . sodium chloride    . sodium chloride    . sodium chloride  LOS: 0 days   Geradine Girt  [facility]   How to contact the [facility] Attending or Consulting provider [place] or covering provider during after hours [place], for this patient?  1. Check the care team in [facility] and look for a) attending/consulting TRH provider listed and b) the [facility] team listed 2. Log into [email or web address] and use [place]'s universal password to access. If you do not have the password, please contact the hospital operator. 3. Locate the [facility] provider you are looking for under [facility] and page to a number that you can be directly reached. 4. If you still have difficulty reaching the provider, please page the [facility] (Director on Call) for the Hospitalists listed on amion for assistance.  03/25/2019, 2:27 PM

## 2019-03-26 DIAGNOSIS — I120 Hypertensive chronic kidney disease with stage 5 chronic kidney disease or end stage renal disease: Secondary | ICD-10-CM | POA: Diagnosis not present

## 2019-03-26 DIAGNOSIS — Z992 Dependence on renal dialysis: Secondary | ICD-10-CM | POA: Diagnosis not present

## 2019-03-26 DIAGNOSIS — J9601 Acute respiratory failure with hypoxia: Secondary | ICD-10-CM

## 2019-03-26 DIAGNOSIS — N186 End stage renal disease: Secondary | ICD-10-CM | POA: Diagnosis not present

## 2019-03-26 NOTE — Progress Notes (Addendum)
[facility] Progress Note   Subjective:  Seen in room. HD yesterday with another 1.4L removed. Treatment ended early d/t circuit clotting. She was concerned that her graft clotted again, which did not.  Breathing comfortable on RA. SpO2 98% on RA  Objective Vitals:   03/25/19 1806 03/25/19 2015 03/26/19 0236 03/26/19 0858  BP: (!) 138/50 (!) 115/35 (!) 120/44 (!) 119/42  Pulse: 77 79 77 77  Resp: 16 20 18 18   Temp: 97.9 F (36.6 C) 97.8 F (36.6 C) 98 F (36.7 C) 98 F (36.7 C)  TempSrc: Oral Oral Oral Oral  SpO2: 98% 100% 93% 98%  Weight: 52.7 kg       Physical Exam General: Well appearing NAD  Heart: RRR Lungs: +rales/occasional wheeze L lung base, improved  Abdomen: soft NTND Extremities: 1+ edema RLE  Dialysis Access: RUE AVG bruised +bruit    Weight change: -2.5 kg   Additional Objective Labs: Basic Metabolic Panel: Recent Labs  Lab 03/24/19 1040 03/24/19 1050 03/25/19 1456  NA 137 135 138  K 5.0 4.8 4.6  CL 95* 101 99  CO2 24  --  25  GLUCOSE 94 88 99  BUN 40* 47* 34*  CREATININE 6.88* 7.40* 6.09*  CALCIUM 8.0*  --  7.8*  PHOS  --   --  6.7*   CBC: Recent Labs  Lab 03/24/19 1040 03/24/19 1050 03/25/19 1456  WBC 7.0  --  6.1  NEUTROABS 4.9  --   --   HGB 10.4* 11.2* 8.8*  HCT 34.3* 33.0* 27.4*  MCV 97.4  --  94.8  PLT 244  --  180   Blood Culture    Component Value Date/Time   SDES BLOOD LEFT ANTECUBITAL 07/18/2018 2250   SPECREQUEST  07/18/2018 2250    BOTTLES DRAWN AEROBIC AND ANAEROBIC Blood Culture adequate volume   CULT  07/18/2018 2250    NO GROWTH 5 DAYS Performed at [facility] [address] [place], [place] [address]    REPTSTATUS 07/23/2018 FINAL 07/18/2018 2250     Medications: . sodium chloride     . amiodarone  200 mg Oral Daily  . aspirin EC  325 mg Oral QPM  . carvedilol  3.125 mg Oral BID WC  . Chlorhexidine Gluconate Cloth  6 each Topical Q0600  . docusate sodium  100 mg Oral QHS  .  heparin  5,000 Units Subcutaneous Q12H  . isosorbide mononitrate  60 mg Oral Daily  . lidocaine-prilocaine  1 application Topical Q T,Th,Sa-HD  . pravastatin  40 mg Oral QPM  . sevelamer carbonate  1,600-2,400 mg Oral TID WC  . sodium chloride flush  3 mL Intravenous Q12H    Dialysis:  [facility]  4h   350/600   52.2kg   2K/2Ca   UFP 4   L AVG Hep 1400 Hectorol 3 mcg TIW Mircera 75 mcg IV q 2 weeks (last 9/24)  Assessment/Plan: 1. Dyspnea/Volume overload 2/2 missed dialysis. COVID negative. Missed dialysis this week d/t clotted AVG. Successful declot on 11/6. s/p HD x 2 for volume removal so far 4 kg off. Now near EDW.  2.  ESRD -  HD TTS. Had HD 11/6, 11/7. Next HD 11/10.  3.  Hypertension/volume  - BP controlled. Post HD wt 52.7kg on 11/17. Can continue to titrate volume down as outpatient. Problem is she usually self-limits UF goals  4.  Anemia  - Hgb 11.2 >8.8  Resume ESA if Hgb remains <10. [name]  5.  Metabolic bone disease -  Continue VDRA/binders  6.  P AFib-  On amiodarone/Coreg   Lynnda Child PA-C [facility] Pager [phone] 03/26/2019,9:23 AM  LOS: 1 day   Pt seen, examined and agree w A/P as above. Down to dry wt after HD x 2. On room air. Going home today.  Kelly Splinter  MD 03/26/2019, 3:03 PM

## 2019-03-26 NOTE — Progress Notes (Signed)
Pt called at the nurse's station saying that she fell. This RN and Chermaine NT went to check on pt. She was found on her knees on the floor. Pt assisted back on the bed with 2 assist. No injuries noted. VSS. Dr. Eliseo Squires notified. Pt stated she was trying to reach something on her night stand then loss her balance. Pt educated on using the call bell for assistance, verbalized understanding. Call bell and personal belongings within reach. Bed alarm in place.

## 2019-03-26 NOTE — Progress Notes (Signed)
Pt called RN to assess right arm with clotted HD access. Pt states that her arm, where the access it located, is tingling and she is feeling some numbness. Arm is warm to the touch, fingers are a little cold but no cyanosis noted, pt is able to move arm, hand, and fingers. Positive bruit. RN messaged NP on call to make aware. Pt only wanted one Tylenol for pain. Will continue to monitor.  Eleanora Neighbor, RN

## 2019-03-26 NOTE — Progress Notes (Signed)
DISCHARGE NOTE HOME Juliannah Bardo to be discharged Home per MD order. Discussed prescriptions and follow up appointments with the patient. Prescriptions given to patient; medication list explained in detail. Patient verbalized understanding.  Skin clean, dry and intact without evidence of skin break down, no evidence of skin tears noted. IV catheter discontinued intact. Site without signs and symptoms of complications. Dressing and pressure applied. Pt denies pain at the site currently. No complaints noted.  Patient free of lines, drains, and wounds.   An After Visit Summary (AVS) was printed and given to the patient. Patient escorted via wheelchair, and discharged home via private auto.  Orville Govern, RN

## 2019-03-26 NOTE — TOC Transition Note (Signed)
Transition of Care [facility]) - CM/SW Discharge Note   Patient Details  Name: Valerie Terrell MRN: [id number] Date of Birth: [birth year]  Transition of Care [facility]) CM/SW Contact:  Carles Collet, RN Phone Number: 03/26/2019, 11:41 AM   Clinical Narrative:   [name] w patient's daughter who is her primary caregiver. She was recently active w [facility], requesting Teddy back for PT. Referral placed to [facility], accepted. Daughter will be able to provide transport home and declined need for any additional DME at home.     Final next level of care: [place] Barriers to Discharge: No Barriers Identified   Patient Goals and CMS Choice Patient states their goals for this hospitalization and ongoing recovery are:: to go home CMS Medicare.gov Compare Post Acute Care list provided to:: Patient Represenative (must comment) Choice offered to / list presented to : Adult Children  Discharge Placement                       Discharge Plan and Services                          HH Arranged: PT HH Agency: [place] Date [facility] Agency Contacted: 03/26/19 Time [place]: [id number] Representative spoke with at [place]: cory  Social Determinants of Health ([place]) Interventions     Readmission Risk Interventions Readmission Risk Prevention Plan 10/06/2018  Transportation Screening Complete  Medication Review [employer]) Complete  PCP or Specialist appointment within 3-5 days of discharge Complete  HRI or Home Care Consult Complete  SW Recovery Care/Counseling Consult Complete  Palliative Care Screening Not [place] Complete  Some recent data might be hidden

## 2019-03-26 NOTE — Evaluation (Addendum)
Physical Therapy Evaluation Patient Details Name: Valerie Terrell MRN: [id number] DOB: 1932-05-22 Today's Date: 03/26/2019   History of Present Illness  Pt is a 85 y.o. F with significant PMH of right hip fracture s/p IM nail 5/19, ESRD (HD TTS), CHF, CVA, pacemaker, CHF, O2 dependence, compression fractures. Presents with dyspnea/volume overload secondary to missed dialysis due to clotted AVG. Successful declot on 11/6.  Clinical Impression  Pt admitted with above diagnosis. Prior to admission, pt lives with her daughter and is ambulatory using a Rollator. Has history of recurrent falling. On PT evaluation, pt presents with decreased functional mobility secondary to gait abnormalities, functional strength deficits, decreased activity tolerance, and balance impairments. Ambulating 100 feet x 2 with Rollator at a min guard assist level. Of note, pt had a fall without injury this morning when reaching outside of base of support while sitting on bed. Education provided re: use of walker with all mobility, generalized walking program, energy conservation techniques, options for purchasing reacher. Will benefit from HHPT at discharge to maximize functional independence and decrease caregiver burden.     Follow Up Recommendations Home health PT;Supervision/Assistance - 24 hour    Equipment Recommendations  None recommended by PT    Recommendations for Other Services       Precautions / Restrictions Precautions Precautions: Fall Restrictions Weight Bearing Restrictions: No      Mobility  Bed Mobility Overal bed mobility: Needs Assistance Bed Mobility: Supine to Sit;Sit to Supine     Supine to sit: Supervision Sit to supine: Supervision   General bed mobility comments: Increased time/effort, with HOB elevated  Transfers Overall transfer level: Needs assistance Equipment used: 4-wheeled walker Transfers: Sit to/from Stand Sit to Stand: Min guard         General transfer comment:  Cues for locking Rollator prior to transfer, squaring hips, reaching back for bed when descending to sitting   Ambulation/Gait Ambulation/Gait assistance: Min guard Gait Distance (Feet): 200 Feet(100', 100') Assistive device: 4-wheeled walker Gait Pattern/deviations: Step-through pattern;Decreased stride length;Trunk flexed Gait velocity: decreased   General Gait Details: Pt with decreased bilateral foot clearance and increased trunk flexion with fatigue. Cues for walker proximity, picking feet up  Stairs            Wheelchair Mobility    Modified Rankin (Stroke Patients Only)       Balance Overall balance assessment: Needs assistance Sitting-balance support: Feet supported Sitting balance-Leahy Scale: Fair     Standing balance support: Bilateral upper extremity supported Standing balance-Leahy Scale: Poor Standing balance comment: reliant on external support                             Pertinent Vitals/Pain Pain Assessment: Faces Faces Pain Scale: Hurts little more Pain Location: hips, right arm Pain Descriptors / Indicators: Sore Pain Intervention(s): Monitored during session    Home Living Family/patient expects to be discharged to:: Private residence Living Arrangements: Children Available Help at Discharge: Family;Available PRN/intermittently Type of Home: House Home Access: Ramped entrance     Home Layout: One level Home Equipment: Bedside commode;Walker - 2 wheels;Cane - single point;Walker - 4 wheels      Prior Function Level of Independence: Needs assistance   Gait / Transfers Assistance Needed: uses Rollator  ADL's / Homemaking Assistance Needed: requires assist for IADL's        Hand Dominance   Dominant Hand: Right    Extremity/Trunk Assessment   Upper Extremity Assessment Upper  Extremity Assessment: Generalized weakness    Lower Extremity Assessment Lower Extremity Assessment: Generalized weakness    Cervical /  Trunk Assessment Cervical / Trunk Assessment: Kyphotic  Communication   Communication: HOH  Cognition Arousal/Alertness: Awake/alert Behavior During Therapy: WFL for tasks assessed/performed Overall Cognitive Status: Within Functional Limits for tasks assessed                                        General Comments      Exercises     Assessment/Plan    PT Assessment Patient needs continued PT services  PT Problem List Decreased strength;Decreased activity tolerance;Decreased balance;Decreased mobility       PT Treatment Interventions DME instruction;Gait training;Functional mobility training;Therapeutic activities;Therapeutic exercise;Balance training;Patient/family education    PT Goals (Current goals can be found in the Care Plan section)  Acute Rehab PT Goals Patient Stated Goal: "be more independent." PT Goal Formulation: With patient Time For Goal Achievement: 04/09/19 Potential to Achieve Goals: Good    Frequency Min 3X/week   Barriers to discharge        Co-evaluation               AM-PAC PT "6 Clicks" Mobility  Outcome Measure Help needed turning from your back to your side while in a flat bed without using bedrails?: None Help needed moving from lying on your back to sitting on the side of a flat bed without using bedrails?: A Little Help needed moving to and from a bed to a chair (including a wheelchair)?: A Little Help needed standing up from a chair using your arms (e.g., wheelchair or bedside chair)?: A Little Help needed to walk in hospital room?: A Little Help needed climbing 3-5 steps with a railing? : A Lot 6 Click Score: 18    End of Session Equipment Utilized During Treatment: Gait belt Activity Tolerance: Patient tolerated treatment well Patient left: in bed;with call bell/phone within reach;with bed alarm set Nurse Communication: Mobility status PT Visit Diagnosis: Unsteadiness on feet (R26.81);History of falling  (Z91.81);Muscle weakness (generalized) (M62.81);Difficulty in walking, not elsewhere classified (R26.2)    Time: [id number] PT Time Calculation (min) (ACUTE ONLY): 24 min   Charges:   PT Evaluation $PT Eval Moderate Complexity: 1 Mod PT Treatments $Gait Training: 8-22 mins        Ellamae Sia, PT, DPT Acute Rehabilitation Services Pager [phone] Office [phone]   Willy Eddy 03/26/2019, 11:02 AM

## 2019-03-26 NOTE — Discharge Summary (Signed)
Physician Discharge Summary  Valerie Terrell [id number] DOB: 28-Dec-1932 DOA: 03/24/2019  PCP: Tammi Sou, MD  Admit date: 03/24/2019 Discharge date: 03/26/2019  Admitted From: home Discharge disposition: home   Recommendations for Outpatient Follow-Up:   Home health 24/7 supervision by family  Discharge Diagnosis:   Active Problems:   Dyspnea   ESRD (end stage renal disease) on dialysis ([place])   Hypoxemia    Discharge Condition: Improved.  Diet recommendation: renal diet  Wound care: None.  Code status: Full.   History of Present Illness:   Valerie Terrell  is a 85 y.o. female, with a past medical history significant for ESRD sent from outpatient nephrology center after she missed few sessions of dialysis this week for shortness of breath.  Patient had issues with clotting her graft this week and she had a partial session on November 3 but missed her dialysis sessions.  Today she is having increasing shortness of breath with orthopnea, no chest pains, no fever, no chills, no abdominal pain, no nausea no vomiting.  Patient had left-sided leg swelling from lesser trochanter hip fracture which is unchanged and is no longer on anticoagulation. In the emergency room her white blood cell count was 7 with a BNP of 2088.  BUN 47 and creatinine 7.40.  Her chest x-ray shows atelectasis versus pneumonia at the left lung base with chronic interstitial lung changes. Discussed with nephrology who will be dialyzing patient today   Hospital Course by Problem:   Increasing dyspnea from volume overload Acute on chronic respiratory failure Probably from volume overload from missing dialysis due to AV fistula clot. S/p HD 2 days in a row -O2 has been weaned off on day of d/c  ESRD HD 11/6 and 11/7 -return to regular schedule  A. Fib Heart rate controlled and now in NSR On aspirin Continue with amiodarone/Coreg  Hypertension Continue with [place]  Consultants:      Discharge Exam:   Vitals:   03/26/19 0236 03/26/19 0858  BP: (!) 120/44 (!) 119/42  Pulse: 77 77  Resp: 18 18  Temp: 98 F (36.7 C) 98 F (36.7 C)  SpO2: 93% 98%   Vitals:   03/25/19 1806 03/25/19 2015 03/26/19 0236 03/26/19 0858  BP: (!) 138/50 (!) 115/35 (!) 120/44 (!) 119/42  Pulse: 77 79 77 77  Resp: 16 20 18 18   Temp: 97.9 F (36.6 C) 97.8 F (36.6 C) 98 F (36.7 C) 98 F (36.7 C)  TempSrc: Oral Oral Oral Oral  SpO2: 98% 100% 93% 98%  Weight: 52.7 kg       General exam: Appears calm and comfortable.    The results of significant diagnostics from this hospitalization (including imaging, microbiology, ancillary and laboratory) are listed below for reference.     Procedures and Diagnostic Studies:   Dg Chest Portable 1 View  Result Date: 03/24/2019 CLINICAL DATA:  Shortness of breath. EXAM: PORTABLE CHEST 1 VIEW COMPARISON:  10/15/2018 FINDINGS: Normal sized heart. Interval patchy opacity at the left lung base. No significant change in a probable small left pleural effusion. The right lung remains clear. Stable mild diffuse prominence of the interstitial markings, left subclavian pacemaker leads and thoracic vertebral kyphoplasty material. Diffuse osteopenia. IMPRESSION: 1. Interval patchy atelectasis or pneumonia at the left lung base. 2. Stable probable small left pleural effusion. 3. Stable mild chronic interstitial lung disease. Electronically Signed   By: Claudie Revering M.D.   On: 03/24/2019 11:04  Labs:   Basic Metabolic Panel: Recent Labs  Lab 03/24/19 1040 03/24/19 1050 03/25/19 1456  NA 137 135 138  K 5.0 4.8 4.6  CL 95* 101 99  CO2 24  --  25  GLUCOSE 94 88 99  BUN 40* 47* 34*  CREATININE 6.88* 7.40* 6.09*  CALCIUM 8.0*  --  7.8*  PHOS  --   --  6.7*   GFR Estimated Creatinine Clearance: 5.1 mL/min (A) (by C-G formula based on SCr of 6.09 mg/dL (H)). Liver Function Tests: Recent Labs  Lab 03/25/19 1456  ALBUMIN 2.2*    No results for input(s): LIPASE, AMYLASE in the last 168 hours. No results for input(s): AMMONIA in the last 168 hours. Coagulation profile No results for input(s): INR, PROTIME in the last 168 hours.  CBC: Recent Labs  Lab 03/24/19 1040 03/24/19 1050 03/25/19 1456  WBC 7.0  --  6.1  NEUTROABS 4.9  --   --   HGB 10.4* 11.2* 8.8*  HCT 34.3* 33.0* 27.4*  MCV 97.4  --  94.8  PLT 244  --  180   Cardiac Enzymes: No results for input(s): CKTOTAL, CKMB, CKMBINDEX, TROPONINI in the last 168 hours. BNP: Invalid input(s): POCBNP CBG: No results for input(s): GLUCAP in the last 168 hours. D-Dimer No results for input(s): DDIMER in the last 72 hours. Hgb A1c No results for input(s): HGBA1C in the last 72 hours. Lipid Profile No results for input(s): CHOL, HDL, LDLCALC, TRIG, CHOLHDL, LDLDIRECT in the last 72 hours. Thyroid function studies No results for input(s): TSH, T4TOTAL, T3FREE, THYROIDAB in the last 72 hours.  Invalid input(s): FREET3 Anemia work up No results for input(s): VITAMINB12, FOLATE, FERRITIN, TIBC, IRON, RETICCTPCT in the last 72 hours. Microbiology Recent Results (from the past 240 hour(s))  SARS CORONAVIRUS 2 (TAT 6-24 HRS) Nasopharyngeal Nasopharyngeal Swab     Status: None   Collection Time: 03/24/19 10:46 AM   Specimen: Nasopharyngeal Swab  Result Value Ref Range Status   SARS Coronavirus 2 NEGATIVE NEGATIVE Final    Comment: (NOTE) SARS-CoV-2 target nucleic acids are NOT DETECTED. The SARS-CoV-2 RNA is generally detectable in upper and lower respiratory specimens during the acute phase of infection. Negative results do not preclude SARS-CoV-2 infection, do not rule out co-infections with other pathogens, and should not be used as the sole basis for treatment or other patient management decisions. Negative results must be combined with clinical observations, patient history, and epidemiological information. The expected result is Negative. Fact  Sheet for Patients: SugarRoll[employer]be Fact Sheet for Healthcare Providers: https://www.woods-mathews.com/ This test is not yet approved or cleared by the [place] FDA and  has been authorized for detection and/or diagnosis of SARS-CoV-2 by FDA under an Emergency Use Authorization (EUA). This EUA will remain  in effect (meaning this test can be used) for the duration of the COVID-19 declaration under Section 56 4(b)(1) of the Act, 21 U.S.C. section 360bbb-3(b)(1), unless the authorization is terminated or revoked sooner. Performed at [facility], [place] [address] [place], [place] [address]      Discharge Instructions:   Discharge Instructions    Discharge instructions   Complete by: As directed    Home health   Increase activity slowly   Complete by: As directed      Allergies as of 03/26/2019      Reactions   Clonidine Derivatives Palpitations, Other (See Comments)   VERY SEDATED   Adhesive [tape] Other (See Comments)   Tears skin  up   Codeine Nausea Only   Nickel Rash   Sulfa Antibiotics Other (See Comments)   Reaction unknown Did not feel well when taken Reaction unknown      Medication List    STOP taking these medications   heparin 1000 UNIT/ML injection     TAKE these medications   acetaminophen 500 MG tablet Commonly known as: TYLENOL Take 500-1,000 mg 3 (three) times daily as needed by mouth for moderate pain.   amiodarone 200 MG tablet Commonly known as: PACERONE TAKE 1 TABLET BY MOUTH  DAILY What changed:   when to take this  additional instructions   aspirin EC 325 MG tablet Take 325 mg by mouth every evening.   b complex-vitamin c-folic acid 0.8 MG Tabs tablet Take 1 tablet by mouth See admin instructions. Take one tablet by mouth on Tuesday, Thursday, Saturday - after dialysis   carvedilol 3.125 MG tablet Commonly known as: COREG TAKE 1 TABLET BY MOUTH TWO  TIMES DAILY What changed:    when to take this  reasons to take this   docusate sodium 100 MG capsule Commonly known as: COLACE Take 100 mg by mouth at bedtime.   isosorbide mononitrate 60 MG 24 hr tablet Commonly known as: IMDUR TAKE 1 TABLET BY MOUTH  DAILY What changed: additional instructions   lidocaine-prilocaine cream Commonly known as: EMLA Apply 1 application topically See admin instructions. Apply small amount to access site (AVF) one hour before dialysis, cover with occlusive dressing (saran wrap)   multivitamin with minerals tablet Take 1 tablet by mouth at bedtime.   ondansetron 4 MG tablet Commonly known as: ZOFRAN Take 1 tablet (4 mg total) by mouth every 8 (eight) hours as needed for nausea or vomiting.   oxyCODONE 5 MG immediate release tablet Commonly known as: Oxy IR/ROXICODONE 1-2 tabs po q6h prn pain What changed:   how much to take  when to take this  reasons to take this  additional instructions   polyvinyl alcohol 1.4 % ophthalmic solution Commonly known as: LIQUIFILM TEARS Place 1-2 drops into both eyes daily as needed for dry eyes.   pravastatin 40 MG tablet Commonly known as: PRAVACHOL TAKE 1 TABLET BY MOUTH  EVERY EVENING What changed: when to take this   sevelamer carbonate 800 MG tablet Commonly known as: RENVELA Take 800-2,400 mg by mouth See admin instructions. Take two - three tablets (1600 mg - 2400 mg) by mouth up to three times daily with meals, take one tablet (800 mg) with snacks   vitamin C 500 MG tablet Commonly known as: ASCORBIC ACID Take 500 mg by mouth daily.   Vitamin D (Ergocalciferol) 1.25 MG (50000 UT) Caps capsule Commonly known as: DRISDOL Take 1 capsule (50,000 Units total) by mouth every Thursday.      Follow-up Information    McGowen, Adrian Blackwater, MD Follow up in 1 week(s).   Specialty: Family Medicine Contact information: [id number] [place]  [phone]            Time coordinating discharge:  35 min  Signed:  Geradine Girt DO  [facility] 03/26/2019, 11:23 AM

## 2019-03-27 ENCOUNTER — Telehealth: Payer: Self-pay | Admitting: Nephrology

## 2019-03-27 ENCOUNTER — Other Ambulatory Visit: Payer: Self-pay | Admitting: Cardiovascular Disease

## 2019-03-27 ENCOUNTER — Telehealth: Payer: Self-pay

## 2019-03-27 ENCOUNTER — Other Ambulatory Visit: Payer: Self-pay | Admitting: Family Medicine

## 2019-03-27 NOTE — Telephone Encounter (Signed)
Transition of Care Contact from [place]  Date of Discharge: 03/26/2019  Date of Contact: 03/27/2019 Method of contact: phone Talked to patient  Patient contacted to discuss transition of care form recent hospitaliztion. Patient was admitted to [facility] from 11/6 to 03/26/2019 with the discharge diagnosis of dyspnea, hypoxemia clotted dialysis access and ESRD.   Medication changes were reviewed.  Patient will follow up with is outpatient dialysis center tomorrow.  Other follow up needs include request for expert cannulator at dialysis - order written.   Jen Mow, PA-C [facility] Pager: [phone]

## 2019-03-27 NOTE — Telephone Encounter (Signed)
Noted: nurse phone contact with patient for TCM. Signed:  Crissie Sickles, MD           03/27/2019

## 2019-03-27 NOTE — Telephone Encounter (Signed)
Spoke with patients daughter, [name].   Transition Care Management Follow-up Telephone Call  Admit date: 03/24/2019 Discharge date: 03/26/2019 Active Problems: Dyspnea, ESRD, Hypoxemia   How have you been since you were released from the hospital? "She's doing okay"   Do you understand why you were in the hospital? yes, Pt missed several dialysis treatments during the week d/t clotted dialysis graft (only received 3 out of 12 hours of treatment).    Do you understand the discharge instructions? yes   Where were you discharged to? Home. Daughter is care taker.    Items Reviewed:  Medications reviewed: yes  Allergies reviewed: yes  Dietary changes reviewed: yes  Referrals reviewed: yes   Functional Questionnaire:   Activities of Daily Living (ADLs):   She states they are independent in the following: None States they require assistance with the following: ambulation, bathing and hygiene, feeding, continence, grooming, toileting and dressing   Any transportation issues/concerns?: no   Any patient concerns? no   Confirmed importance and date/time of follow-up visits scheduled yes  Provider Appointment booked with PCP 04/05/2019.   Confirmed with patient if condition begins to worsen call PCP or go to the ER.  Patient was given the office number and encouraged to call back with question or concerns.  : yes

## 2019-03-28 DIAGNOSIS — N186 End stage renal disease: Secondary | ICD-10-CM | POA: Diagnosis not present

## 2019-03-28 DIAGNOSIS — D509 Iron deficiency anemia, unspecified: Secondary | ICD-10-CM | POA: Diagnosis not present

## 2019-03-28 DIAGNOSIS — D631 Anemia in chronic kidney disease: Secondary | ICD-10-CM | POA: Diagnosis not present

## 2019-03-28 DIAGNOSIS — N2581 Secondary hyperparathyroidism of renal origin: Secondary | ICD-10-CM | POA: Diagnosis not present

## 2019-03-28 DIAGNOSIS — E876 Hypokalemia: Secondary | ICD-10-CM | POA: Diagnosis not present

## 2019-03-28 DIAGNOSIS — Z992 Dependence on renal dialysis: Secondary | ICD-10-CM | POA: Diagnosis not present

## 2019-03-28 DIAGNOSIS — D689 Coagulation defect, unspecified: Secondary | ICD-10-CM | POA: Diagnosis not present

## 2019-03-28 DIAGNOSIS — R52 Pain, unspecified: Secondary | ICD-10-CM | POA: Diagnosis not present

## 2019-03-29 DIAGNOSIS — D509 Iron deficiency anemia, unspecified: Secondary | ICD-10-CM | POA: Diagnosis not present

## 2019-03-29 DIAGNOSIS — E876 Hypokalemia: Secondary | ICD-10-CM | POA: Diagnosis not present

## 2019-03-29 DIAGNOSIS — R52 Pain, unspecified: Secondary | ICD-10-CM | POA: Diagnosis not present

## 2019-03-29 DIAGNOSIS — N2581 Secondary hyperparathyroidism of renal origin: Secondary | ICD-10-CM | POA: Diagnosis not present

## 2019-03-29 DIAGNOSIS — D689 Coagulation defect, unspecified: Secondary | ICD-10-CM | POA: Diagnosis not present

## 2019-03-29 DIAGNOSIS — E8770 Fluid overload, unspecified: Secondary | ICD-10-CM | POA: Diagnosis not present

## 2019-03-29 DIAGNOSIS — D631 Anemia in chronic kidney disease: Secondary | ICD-10-CM | POA: Diagnosis not present

## 2019-03-29 DIAGNOSIS — N186 End stage renal disease: Secondary | ICD-10-CM | POA: Diagnosis not present

## 2019-03-29 DIAGNOSIS — Z992 Dependence on renal dialysis: Secondary | ICD-10-CM | POA: Diagnosis not present

## 2019-03-30 DIAGNOSIS — N186 End stage renal disease: Secondary | ICD-10-CM | POA: Diagnosis not present

## 2019-03-30 DIAGNOSIS — Z992 Dependence on renal dialysis: Secondary | ICD-10-CM | POA: Diagnosis not present

## 2019-03-30 DIAGNOSIS — D631 Anemia in chronic kidney disease: Secondary | ICD-10-CM | POA: Diagnosis not present

## 2019-03-30 DIAGNOSIS — N2581 Secondary hyperparathyroidism of renal origin: Secondary | ICD-10-CM | POA: Diagnosis not present

## 2019-03-30 DIAGNOSIS — D689 Coagulation defect, unspecified: Secondary | ICD-10-CM | POA: Diagnosis not present

## 2019-03-30 DIAGNOSIS — D509 Iron deficiency anemia, unspecified: Secondary | ICD-10-CM | POA: Diagnosis not present

## 2019-03-30 DIAGNOSIS — R52 Pain, unspecified: Secondary | ICD-10-CM | POA: Diagnosis not present

## 2019-03-30 DIAGNOSIS — E876 Hypokalemia: Secondary | ICD-10-CM | POA: Diagnosis not present

## 2019-03-31 DIAGNOSIS — I132 Hypertensive heart and chronic kidney disease with heart failure and with stage 5 chronic kidney disease, or end stage renal disease: Secondary | ICD-10-CM | POA: Diagnosis not present

## 2019-03-31 DIAGNOSIS — N186 End stage renal disease: Secondary | ICD-10-CM | POA: Diagnosis not present

## 2019-03-31 DIAGNOSIS — J9621 Acute and chronic respiratory failure with hypoxia: Secondary | ICD-10-CM | POA: Diagnosis not present

## 2019-03-31 DIAGNOSIS — I4891 Unspecified atrial fibrillation: Secondary | ICD-10-CM | POA: Diagnosis not present

## 2019-03-31 DIAGNOSIS — I509 Heart failure, unspecified: Secondary | ICD-10-CM | POA: Diagnosis not present

## 2019-04-01 DIAGNOSIS — N2581 Secondary hyperparathyroidism of renal origin: Secondary | ICD-10-CM | POA: Diagnosis not present

## 2019-04-01 DIAGNOSIS — E876 Hypokalemia: Secondary | ICD-10-CM | POA: Diagnosis not present

## 2019-04-01 DIAGNOSIS — D509 Iron deficiency anemia, unspecified: Secondary | ICD-10-CM | POA: Diagnosis not present

## 2019-04-01 DIAGNOSIS — R52 Pain, unspecified: Secondary | ICD-10-CM | POA: Diagnosis not present

## 2019-04-01 DIAGNOSIS — Z992 Dependence on renal dialysis: Secondary | ICD-10-CM | POA: Diagnosis not present

## 2019-04-01 DIAGNOSIS — D689 Coagulation defect, unspecified: Secondary | ICD-10-CM | POA: Diagnosis not present

## 2019-04-01 DIAGNOSIS — D631 Anemia in chronic kidney disease: Secondary | ICD-10-CM | POA: Diagnosis not present

## 2019-04-01 DIAGNOSIS — N186 End stage renal disease: Secondary | ICD-10-CM | POA: Diagnosis not present

## 2019-04-03 ENCOUNTER — Other Ambulatory Visit: Payer: Self-pay

## 2019-04-03 NOTE — Patient Outreach (Signed)
Red Emmi: Reason: Feeling sad, anxious, hopeless  Reviewed Medical record.   Placed call to patient who answered and identified herself. Patient reports that she is doing well. Reports AV graft was dilated with a balloon and dialysis is much better.  Reviewed red emmi alert and the reason for the call. Patient reports she is not depressed. States she is in her normal state with her current diagnosis.  Denies depression.    Inquired about any needs that are unmet and patient declines needs at this time. Reports her daughter is her caregiver.  Tomasa Rand, RN, BSN, CEN [facility] [employer] [phone]

## 2019-04-04 ENCOUNTER — Encounter

## 2019-04-04 DIAGNOSIS — E876 Hypokalemia: Secondary | ICD-10-CM | POA: Diagnosis not present

## 2019-04-04 DIAGNOSIS — R52 Pain, unspecified: Secondary | ICD-10-CM | POA: Diagnosis not present

## 2019-04-04 DIAGNOSIS — D509 Iron deficiency anemia, unspecified: Secondary | ICD-10-CM | POA: Diagnosis not present

## 2019-04-04 DIAGNOSIS — N2581 Secondary hyperparathyroidism of renal origin: Secondary | ICD-10-CM | POA: Diagnosis not present

## 2019-04-04 DIAGNOSIS — Z992 Dependence on renal dialysis: Secondary | ICD-10-CM | POA: Diagnosis not present

## 2019-04-04 DIAGNOSIS — D631 Anemia in chronic kidney disease: Secondary | ICD-10-CM | POA: Diagnosis not present

## 2019-04-04 DIAGNOSIS — D689 Coagulation defect, unspecified: Secondary | ICD-10-CM | POA: Diagnosis not present

## 2019-04-04 DIAGNOSIS — N186 End stage renal disease: Secondary | ICD-10-CM | POA: Diagnosis not present

## 2019-04-05 ENCOUNTER — Encounter: Payer: Self-pay | Admitting: Family Medicine

## 2019-04-05 ENCOUNTER — Other Ambulatory Visit: Payer: Self-pay

## 2019-04-05 ENCOUNTER — Encounter: Payer: Medicare Other | Admitting: Internal Medicine

## 2019-04-05 ENCOUNTER — Ambulatory Visit (INDEPENDENT_AMBULATORY_CARE_PROVIDER_SITE_OTHER): Payer: Medicare Other | Admitting: Family Medicine

## 2019-04-05 VITALS — BP 130/64 | HR 82 | Temp 97.9°F | Resp 16 | Ht 59.0 in

## 2019-04-05 DIAGNOSIS — E8779 Other fluid overload: Secondary | ICD-10-CM

## 2019-04-05 DIAGNOSIS — T8241XD Breakdown (mechanical) of vascular dialysis catheter, subsequent encounter: Secondary | ICD-10-CM | POA: Diagnosis not present

## 2019-04-05 DIAGNOSIS — M545 Low back pain, unspecified: Secondary | ICD-10-CM

## 2019-04-05 DIAGNOSIS — J9621 Acute and chronic respiratory failure with hypoxia: Secondary | ICD-10-CM | POA: Diagnosis not present

## 2019-04-05 DIAGNOSIS — S7002XA Contusion of left hip, initial encounter: Secondary | ICD-10-CM | POA: Diagnosis not present

## 2019-04-05 DIAGNOSIS — I509 Heart failure, unspecified: Secondary | ICD-10-CM | POA: Diagnosis not present

## 2019-04-05 DIAGNOSIS — I4891 Unspecified atrial fibrillation: Secondary | ICD-10-CM | POA: Diagnosis not present

## 2019-04-05 DIAGNOSIS — I132 Hypertensive heart and chronic kidney disease with heart failure and with stage 5 chronic kidney disease, or end stage renal disease: Secondary | ICD-10-CM | POA: Diagnosis not present

## 2019-04-05 DIAGNOSIS — M25552 Pain in left hip: Secondary | ICD-10-CM | POA: Diagnosis not present

## 2019-04-05 DIAGNOSIS — N186 End stage renal disease: Secondary | ICD-10-CM | POA: Diagnosis not present

## 2019-04-05 MED ORDER — ONDANSETRON HCL 4 MG PO TABS: 4.0000 mg | ORAL_TABLET | Freq: Three times a day (TID) | ORAL | 1 refills | Status: DC | PRN

## 2019-04-05 MED ORDER — OXYCODONE HCL 5 MG PO TABS: ORAL_TABLET | ORAL | 0 refills | Status: DC

## 2019-04-05 NOTE — Progress Notes (Signed)
04/05/2019  CC:  Chief Complaint  Patient presents with  . Hospitalization Follow-up    [name] who presents accompanied by her daughter for  hospital follow up, specifically [facility] face-to-face visit. Dates hospitalized: 11/6-11/8, 2020. Days since d/c from hospital: 10 days Patient was discharged from hospital to home. Reason for admission to hospital: SOB after having missed a few sessions of HD b/c of problems with clotting of her graft. Date of interactive (phone) contact with patient and/or caregiver: 03/27/19  I have reviewed patient's discharge summary plus pertinent specific notes, labs, and imaging from the hospitalization.    Doing well, had HD yesterday.  She has had her R arm graft fixed and has been getting HD through it w/out problem now. R leg is still doing fine.   Fell on L hip couple weeks ago, requiring oxycodone for L hip pain and L LB pain.  "Just still with muscle soreness", o/w getting better gradually. She got an injection in her LB and this helped. TAking some oxycodone again since her fall, doesn't feel impaired by the med but it does make her nauseated so she takes zofran with it and this helps.  Medication reconciliation was done today and patient is taking meds as recommended by discharging hospitalist/specialist.    PMH:  Past Medical History:  Diagnosis Date  . Anemia of chronic disease 2018   + anemia of CRI?  [name] Atrophy of left kidney    with absent blood flow by renal artery dopplers (Dr. Gwenlyn Found)  . 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 Chronic combined systolic and diastolic CHF (congestive heart failure) ([place])    a. 11/2016: echo showing EF of 35-40%, RV strain noted, mild MR and mild TR.   Echo showed much improved EF 12/2017 (55-60%)  . Chronic respiratory failure with hypoxia ([place]) 02/11/2017   Now on chronic O2 (intermittent as of summer 2019).    . Closed fracture of right  superior pubic ramus ([place]) 07/18/2018   Non op mgmt (Dr. Victorino December was ortho who saw her in hosp but i don't think pt followed up with him).  . Closed right hip fracture ([place]) 10/04/2018   ORIF  . Debilitated patient    WC dependent as of 2018.  Hosp bed + full assistance with most ADLs s/p hip fx surg 09/2018.  [name] ESRD on hemodialysis ([place]) 01/2017   T/Th/Sat schedule- [place].  Right basilic AV fistula [id number].  Graft 2019.  [name] History of adenomatous polyp of colon   . History of kidney stones   . History of subarachnoid hemorrhage 10/2014   after syncope and while on xarelto  . Hyperlipidemia   . Hypertension    Difficult to control, in the setting of one functioning kidney: pt was referred to nephrology by Dr. Gwenlyn Found 06/2015.  . Lumbar radiculopathy 2012  . Lumbar spinal stenosis 2019   facet injections only very short term relief.  L1 and L2 selective nerve root blocks helpful 04/2018.  . Lumbar spondylosis    MR 07/2016---no sign of spinal nerve compression or cord compression.  Pt set up with outpt ortho while admitted to hosp 07/2016.  . Malnourished ([place])   . Metatarsal fracture 06/10/2016   Nondisplaced, left 5th metatarsal--pt was referred to ortho  . Osteopenia 2014   T-score -2.1  . PAF (paroxysmal atrial fibrillation) ([facility])    Eliquis started after BRAO and CVA.  She was changed  to warfarin 2018. All anticoag stopped due to recurrent falls.  . Presence of permanent cardiac pacemaker   . Right rib fracture 12/2016   s/p fall  . Sick sinus syndrome ([place]) 11/2016   Dual chamber pacer insertion 2018 (Dr. Lovena Le)  . Stroke ([place]) 0000000   cardioembolic (had CVA while on no anticoag)--"scattered subacute punctate infarcts: 1 in R parietal lobe and 2 in occipital cortex" on MRI br.  CT angio head/neck: aortic arch athero.  R ICA 20% stenosis, L ICA w/out any stenosis.  . Thoracic back pain 01/2017; 01/2018   2018: Facet?  Dr. Ramos->steroid injection.  01/2018-->CT T spine  to check for a new comp fx-->none found.  Selective nerve root inj in T spine helpful on R side.   . Thoracic compression fracture ([place]) 07/2016   T3.  T7 and T8-- T8 kyphoplasty during hosp admission 07/2016.  Neuro referred pt to pain mgmt for consideration of injection 09/2016.  I referred her to endo 08/2016 for consideration of calcitonin treatment.    PSH:  Past Surgical History:  Procedure Laterality Date  . APPENDECTOMY  child  . AV FISTULA PLACEMENT Right 03/31/2017   Procedure: Right ARM Basilic vein transposition;  Surgeon: Conrad Bozeman, MD;  Location: [facility];  Service: Vascular;  Laterality: Right;  . AV FISTULA PLACEMENT Right 12/06/2017   Procedure: INSERTION OF ARTERIOVENOUS (AV) GORE-TEX GRAFT ARM RIGHT UPPER EXTREMITY;  Surgeon: [name], MD;  Location: [place];  Service: Vascular;  Laterality: Right;  . CARDIOVASCULAR STRESS TEST  01/31/2016   Stress myoview: NORMAL/Low risk.  EF 56%.  . [place]  . COLONOSCOPY  2015   + hx of adenomatous polyps.  Need digest health spec in [facility] records to see when pt due for next colonoscopy  . DECLOT CKV Right 03/21/2019  . EYE SURGERY Bilateral    Catarct  . INSERTION OF DIALYSIS CATHETER Right 01/29/2017   Procedure: INSERTION OF DIALYSIS CATHETER- RIGHT INTERNAL JUGULAR;  Surgeon: [name], MD;  Location: [facility];  Service: Vascular;  Laterality: Right;  . INTRAMEDULLARY (IM) NAIL INTERTROCHANTERIC Right 10/04/2018   Procedure: INTRAMEDULLARY (IM) NAIL INTERTROCHANTRIC;  Surgeon: Nicholes Stairs, MD;  Location: [facility];  Service: Orthopedics;  Laterality: Right;  . IR GENERIC HISTORICAL  07/24/2016   IR KYPHO THORACIC WITH BONE BIOPSY 07/24/2016 Luanne Bras, MD [facility]  . KYPHOPLASTY  07/27/2016   T8  . PACEMAKER IMPLANT N/A 12/03/2016   Procedure: Pacemaker Implant;  Surgeon: Evans Lance, MD;  Location: [facility];  Service: Cardiovascular;  Laterality: N/A;  . Renal artery dopplers   02/26/2016   Her right renal dimension was 11 cm pole to pole with mild to moderate right renal artery stenosis. A right renal aortic ratio was 3.22 suggesting less than a 50% stenosis.  . Right upper arm AV Gore-Tex graft  12/06/2017   Dr. Donnetta Hutching  . TONSILLECTOMY    . TRANSTHORACIC ECHOCARDIOGRAM  01/29/2016; 11/2016; 12/2017   2017: EF 55-60%, normal LV wall motion, grade I DD.  No cardiac source of emboli was seen.  2018: EF 35-40%, could not assess DD due to a-fib, pulm HTN noted. 12/2017 EF 55-60%, nl wall motion, grd II DD, mod MR and pulm regurg, increased pulm pressure (peak 52).    MEDS:  Outpatient Medications Prior to Visit  Medication Sig Dispense Refill  . acetaminophen (TYLENOL) 500 MG tablet Take 500-1,000 mg 3 (three) times daily as needed  by mouth for moderate pain.     [name] amiodarone (PACERONE) 200 MG tablet TAKE 1 TABLET BY MOUTH  DAILY 90 tablet 1  . aspirin EC 325 MG tablet Take 325 mg by mouth every evening.    [name] b complex-vitamin c-folic acid (NEPHRO-VITE) 0.8 MG TABS tablet Take 1 tablet by mouth See admin instructions. Take one tablet by mouth on Tuesday, Thursday, Saturday - after dialysis    . carvedilol (COREG) 3.125 MG tablet TAKE 1 TABLET BY MOUTH TWO  TIMES DAILY (Patient taking differently: Take 3.125 mg by mouth 2 (two) times daily as needed (SBP >120). ) 180 tablet 1  . docusate sodium (COLACE) 100 MG capsule Take 100 mg by mouth at bedtime.     . isosorbide mononitrate (IMDUR) 60 MG 24 hr tablet Take 1 tablet (60 mg total) by mouth daily. Take one tablet (60 mg) by mouth on Sunday, Monday, Wednesday, Friday mornings, take one tablet (60 mg) on Tuesday, Thursday, Saturday after dialysis 90 tablet 0  . lidocaine-prilocaine (EMLA) cream Apply 1 application topically See admin instructions. Apply small amount to access site (AVF) one hour before dialysis, cover with occlusive dressing (saran wrap)    . Multiple Vitamins-Minerals (MULTIVITAMIN WITH MINERALS) tablet Take 1  tablet by mouth at bedtime.     . ondansetron (ZOFRAN) 4 MG tablet Take 1 tablet (4 mg total) by mouth every 8 (eight) hours as needed for nausea or vomiting. 60 tablet 1  . oxyCODONE (OXY IR/ROXICODONE) 5 MG immediate release tablet 1-2 tabs po q6h prn pain (Patient taking differently: 5-10 mg every 6 (six) hours as needed for severe pain. ) 180 tablet 0  . polyvinyl alcohol (LIQUIFILM TEARS) 1.4 % ophthalmic solution Place 1-2 drops into both eyes daily as needed for dry eyes.     . pravastatin (PRAVACHOL) 40 MG tablet TAKE 1 TABLET BY MOUTH  EVERY EVENING (Patient taking differently: Take 40 mg by mouth at bedtime. ) 90 tablet 1  . sevelamer carbonate (RENVELA) 800 MG tablet Take 800-2,400 mg by mouth See admin instructions. Take two - three tablets (1600 mg - 2400 mg) by mouth up to three times daily with meals, take one tablet (800 mg) with snacks    . vitamin C (ASCORBIC ACID) 500 MG tablet Take 500 mg by mouth daily.    . Vitamin D, Ergocalciferol, (DRISDOL) 1.25 MG (50000 UT) CAPS capsule Take 1 capsule (50,000 Units total) by mouth every Thursday. 12 capsule 3   No facility-administered medications prior to visit.    Review of Systems  Constitutional: Negative for fatigue and fever.  HENT: Negative for congestion and sore throat.   Eyes: Negative for visual disturbance.  Respiratory: Negative for cough.   Cardiovascular: Negative for chest pain.  Gastrointestinal: Negative for abdominal pain and nausea.  Genitourinary: Negative for dysuria.  Musculoskeletal: Positive for back pain (left lower) and myalgias (left glut/hip). Negative for joint swelling.  Skin: Negative for rash.  Neurological: Negative for weakness and headaches.  Hematological: Negative for adenopathy.   EXAM: BP 130/64 (BP Location: Left Arm, Patient Position: Sitting, Cuff Size: Normal)   Pulse 82   Temp 97.9 F (36.6 C) (Temporal)   Resp 16   Ht 4\' 11"  (1.499 m)   LMP  (LMP Unknown)   SpO2 93%   BMI 23.47  kg/m  Gen: Alert, well appearing.  Patient is oriented to person, place, time, and situation. AFFECT: pleasant, lucid thought and speech. CV: RRR, [id number] systolic murmur,  no diastolic murmur, no r/g. Chest is clear, no wheezing or rales. Normal symmetric air entry throughout both lung fields. No chest wall deformities or tenderness. EXT: no clubbing or cyanosis.  R arm with normal temperature and color, graft with excellent thrill/pulsation. 1+ pitting edema R LL.  No edema L LL.  Pertinent labs/imaging   Chemistry      Component Value Date/Time   NA 138 03/25/2019 1456   NA 138 06/23/2018   K 4.6 03/25/2019 1456   CL 99 03/25/2019 1456   CO2 25 03/25/2019 1456   BUN 34 (H) 03/25/2019 1456   BUN 30 (A) 06/23/2018   CREATININE 6.09 (H) 03/25/2019 1456   CREATININE 1.63 (H) 03/24/2016 0110   GLU 128 09/01/2016      Component Value Date/Time   CALCIUM 7.8 (L) 03/25/2019 1456   CALCIUM 9.5 02/16/2017 0232   ALKPHOS 59 07/18/2018 2236   AST 17 07/18/2018 2236   ALT 9 07/18/2018 2236   BILITOT 0.5 07/18/2018 2236   BILITOT 0.2 06/17/2016 0819     Lab Results  Component Value Date   WBC 6.1 03/25/2019   HGB 8.8 (L) 03/25/2019   HCT 27.4 (L) 03/25/2019   MCV 94.8 03/25/2019   PLT 180 03/25/2019   Lab Results  Component Value Date   IRON 60 06/23/2018   TIBC 193 06/23/2018   FERRITIN 516 06/10/2017   Lab Results  Component Value Date   TSH 2.530 01/13/2019   Lab Results  Component Value Date   HGBA1C 5.2 12/15/2016   Portable CXR 03/24/19: FINDINGS: Normal sized heart. Interval patchy opacity at the left lung base. No significant change in a probable small left pleural effusion. The right lung remains clear. Stable mild diffuse prominence of the interstitial markings, left subclavian pacemaker leads and thoracic vertebral kyphoplasty material. Diffuse osteopenia.  IMPRESSION: 1. Interval patchy atelectasis or pneumonia at the left lung base. 2. Stable probable  small left pleural effusion. 3. Stable mild chronic interstitial lung disease.  ASSESSMENT/PLAN:  1) Hosp f/u for acute volume overload--all fine now after resuming HD and getting clotted graft fixed with balloon angioplasty per pt report today.  Acute L hip and LB muscular pain/contusion: oxycodone 5mg , 1-2 q6h prn, #90 rx'd today. She always uses pain meds sparingly.  Medical decision making of moderate complexity was utilized today.  FOLLOW UP:  3 mo  Signed:  Crissie Sickles, MD           04/05/2019

## 2019-04-06 DIAGNOSIS — E876 Hypokalemia: Secondary | ICD-10-CM | POA: Diagnosis not present

## 2019-04-06 DIAGNOSIS — D509 Iron deficiency anemia, unspecified: Secondary | ICD-10-CM | POA: Diagnosis not present

## 2019-04-06 DIAGNOSIS — N2581 Secondary hyperparathyroidism of renal origin: Secondary | ICD-10-CM | POA: Diagnosis not present

## 2019-04-06 DIAGNOSIS — N186 End stage renal disease: Secondary | ICD-10-CM | POA: Diagnosis not present

## 2019-04-06 DIAGNOSIS — D631 Anemia in chronic kidney disease: Secondary | ICD-10-CM | POA: Diagnosis not present

## 2019-04-06 DIAGNOSIS — R52 Pain, unspecified: Secondary | ICD-10-CM | POA: Diagnosis not present

## 2019-04-06 DIAGNOSIS — Z992 Dependence on renal dialysis: Secondary | ICD-10-CM | POA: Diagnosis not present

## 2019-04-06 DIAGNOSIS — D689 Coagulation defect, unspecified: Secondary | ICD-10-CM | POA: Diagnosis not present

## 2019-04-07 ENCOUNTER — Telehealth: Payer: Self-pay

## 2019-04-07 NOTE — Telephone Encounter (Signed)
Received paperwork from [facility] on 04/07/2019. Paperwork was placed on providers desk on 04/07/2019 for review.

## 2019-04-08 DIAGNOSIS — N186 End stage renal disease: Secondary | ICD-10-CM | POA: Diagnosis not present

## 2019-04-08 DIAGNOSIS — D689 Coagulation defect, unspecified: Secondary | ICD-10-CM | POA: Diagnosis not present

## 2019-04-08 DIAGNOSIS — Z992 Dependence on renal dialysis: Secondary | ICD-10-CM | POA: Diagnosis not present

## 2019-04-08 DIAGNOSIS — D509 Iron deficiency anemia, unspecified: Secondary | ICD-10-CM | POA: Diagnosis not present

## 2019-04-08 DIAGNOSIS — R52 Pain, unspecified: Secondary | ICD-10-CM | POA: Diagnosis not present

## 2019-04-08 DIAGNOSIS — N2581 Secondary hyperparathyroidism of renal origin: Secondary | ICD-10-CM | POA: Diagnosis not present

## 2019-04-08 DIAGNOSIS — D631 Anemia in chronic kidney disease: Secondary | ICD-10-CM | POA: Diagnosis not present

## 2019-04-08 DIAGNOSIS — E876 Hypokalemia: Secondary | ICD-10-CM | POA: Diagnosis not present

## 2019-04-10 DIAGNOSIS — N2581 Secondary hyperparathyroidism of renal origin: Secondary | ICD-10-CM | POA: Diagnosis not present

## 2019-04-10 DIAGNOSIS — E876 Hypokalemia: Secondary | ICD-10-CM | POA: Diagnosis not present

## 2019-04-10 DIAGNOSIS — D631 Anemia in chronic kidney disease: Secondary | ICD-10-CM | POA: Diagnosis not present

## 2019-04-10 DIAGNOSIS — N186 End stage renal disease: Secondary | ICD-10-CM | POA: Diagnosis not present

## 2019-04-10 DIAGNOSIS — Z992 Dependence on renal dialysis: Secondary | ICD-10-CM | POA: Diagnosis not present

## 2019-04-10 DIAGNOSIS — R52 Pain, unspecified: Secondary | ICD-10-CM | POA: Diagnosis not present

## 2019-04-10 DIAGNOSIS — D689 Coagulation defect, unspecified: Secondary | ICD-10-CM | POA: Diagnosis not present

## 2019-04-10 DIAGNOSIS — D509 Iron deficiency anemia, unspecified: Secondary | ICD-10-CM | POA: Diagnosis not present

## 2019-04-11 DIAGNOSIS — Z9181 History of falling: Secondary | ICD-10-CM

## 2019-04-11 DIAGNOSIS — Z79891 Long term (current) use of opiate analgesic: Secondary | ICD-10-CM

## 2019-04-11 DIAGNOSIS — Z7982 Long term (current) use of aspirin: Secondary | ICD-10-CM

## 2019-04-11 DIAGNOSIS — I132 Hypertensive heart and chronic kidney disease with heart failure and with stage 5 chronic kidney disease, or end stage renal disease: Secondary | ICD-10-CM | POA: Diagnosis not present

## 2019-04-11 DIAGNOSIS — Z992 Dependence on renal dialysis: Secondary | ICD-10-CM

## 2019-04-11 DIAGNOSIS — J9621 Acute and chronic respiratory failure with hypoxia: Secondary | ICD-10-CM | POA: Diagnosis not present

## 2019-04-11 DIAGNOSIS — M8580 Other specified disorders of bone density and structure, unspecified site: Secondary | ICD-10-CM

## 2019-04-11 DIAGNOSIS — I509 Heart failure, unspecified: Secondary | ICD-10-CM | POA: Diagnosis not present

## 2019-04-11 DIAGNOSIS — N186 End stage renal disease: Secondary | ICD-10-CM | POA: Diagnosis not present

## 2019-04-11 DIAGNOSIS — J849 Interstitial pulmonary disease, unspecified: Secondary | ICD-10-CM

## 2019-04-11 DIAGNOSIS — I4891 Unspecified atrial fibrillation: Secondary | ICD-10-CM

## 2019-04-11 NOTE — Telephone Encounter (Signed)
Signed and put in box to go up front.  

## 2019-04-12 DIAGNOSIS — R52 Pain, unspecified: Secondary | ICD-10-CM | POA: Diagnosis not present

## 2019-04-12 DIAGNOSIS — N186 End stage renal disease: Secondary | ICD-10-CM | POA: Diagnosis not present

## 2019-04-12 DIAGNOSIS — D689 Coagulation defect, unspecified: Secondary | ICD-10-CM | POA: Diagnosis not present

## 2019-04-12 DIAGNOSIS — D509 Iron deficiency anemia, unspecified: Secondary | ICD-10-CM | POA: Diagnosis not present

## 2019-04-12 DIAGNOSIS — N2581 Secondary hyperparathyroidism of renal origin: Secondary | ICD-10-CM | POA: Diagnosis not present

## 2019-04-12 DIAGNOSIS — E876 Hypokalemia: Secondary | ICD-10-CM | POA: Diagnosis not present

## 2019-04-12 DIAGNOSIS — D631 Anemia in chronic kidney disease: Secondary | ICD-10-CM | POA: Diagnosis not present

## 2019-04-12 DIAGNOSIS — Z992 Dependence on renal dialysis: Secondary | ICD-10-CM | POA: Diagnosis not present

## 2019-04-14 DIAGNOSIS — I4891 Unspecified atrial fibrillation: Secondary | ICD-10-CM | POA: Diagnosis not present

## 2019-04-14 DIAGNOSIS — N186 End stage renal disease: Secondary | ICD-10-CM | POA: Diagnosis not present

## 2019-04-14 DIAGNOSIS — I509 Heart failure, unspecified: Secondary | ICD-10-CM | POA: Diagnosis not present

## 2019-04-14 DIAGNOSIS — J9621 Acute and chronic respiratory failure with hypoxia: Secondary | ICD-10-CM | POA: Diagnosis not present

## 2019-04-14 DIAGNOSIS — I132 Hypertensive heart and chronic kidney disease with heart failure and with stage 5 chronic kidney disease, or end stage renal disease: Secondary | ICD-10-CM | POA: Diagnosis not present

## 2019-04-15 DIAGNOSIS — D631 Anemia in chronic kidney disease: Secondary | ICD-10-CM | POA: Diagnosis not present

## 2019-04-15 DIAGNOSIS — D689 Coagulation defect, unspecified: Secondary | ICD-10-CM | POA: Diagnosis not present

## 2019-04-15 DIAGNOSIS — N186 End stage renal disease: Secondary | ICD-10-CM | POA: Diagnosis not present

## 2019-04-15 DIAGNOSIS — R52 Pain, unspecified: Secondary | ICD-10-CM | POA: Diagnosis not present

## 2019-04-15 DIAGNOSIS — Z992 Dependence on renal dialysis: Secondary | ICD-10-CM | POA: Diagnosis not present

## 2019-04-15 DIAGNOSIS — D509 Iron deficiency anemia, unspecified: Secondary | ICD-10-CM | POA: Diagnosis not present

## 2019-04-15 DIAGNOSIS — E876 Hypokalemia: Secondary | ICD-10-CM | POA: Diagnosis not present

## 2019-04-15 DIAGNOSIS — N2581 Secondary hyperparathyroidism of renal origin: Secondary | ICD-10-CM | POA: Diagnosis not present

## 2019-04-17 DIAGNOSIS — I120 Hypertensive chronic kidney disease with stage 5 chronic kidney disease or end stage renal disease: Secondary | ICD-10-CM | POA: Diagnosis not present

## 2019-04-17 DIAGNOSIS — Z992 Dependence on renal dialysis: Secondary | ICD-10-CM | POA: Diagnosis not present

## 2019-04-17 DIAGNOSIS — N186 End stage renal disease: Secondary | ICD-10-CM | POA: Diagnosis not present

## 2019-04-18 DIAGNOSIS — N2581 Secondary hyperparathyroidism of renal origin: Secondary | ICD-10-CM | POA: Diagnosis not present

## 2019-04-18 DIAGNOSIS — Z992 Dependence on renal dialysis: Secondary | ICD-10-CM | POA: Diagnosis not present

## 2019-04-18 DIAGNOSIS — D509 Iron deficiency anemia, unspecified: Secondary | ICD-10-CM | POA: Diagnosis not present

## 2019-04-18 DIAGNOSIS — D631 Anemia in chronic kidney disease: Secondary | ICD-10-CM | POA: Diagnosis not present

## 2019-04-18 DIAGNOSIS — D689 Coagulation defect, unspecified: Secondary | ICD-10-CM | POA: Diagnosis not present

## 2019-04-18 DIAGNOSIS — N186 End stage renal disease: Secondary | ICD-10-CM | POA: Diagnosis not present

## 2019-04-18 DIAGNOSIS — E876 Hypokalemia: Secondary | ICD-10-CM | POA: Diagnosis not present

## 2019-04-19 DIAGNOSIS — J9621 Acute and chronic respiratory failure with hypoxia: Secondary | ICD-10-CM | POA: Diagnosis not present

## 2019-04-19 DIAGNOSIS — N186 End stage renal disease: Secondary | ICD-10-CM | POA: Diagnosis not present

## 2019-04-19 DIAGNOSIS — I509 Heart failure, unspecified: Secondary | ICD-10-CM | POA: Diagnosis not present

## 2019-04-19 DIAGNOSIS — I4891 Unspecified atrial fibrillation: Secondary | ICD-10-CM | POA: Diagnosis not present

## 2019-04-19 DIAGNOSIS — I132 Hypertensive heart and chronic kidney disease with heart failure and with stage 5 chronic kidney disease, or end stage renal disease: Secondary | ICD-10-CM | POA: Diagnosis not present

## 2019-04-20 DIAGNOSIS — D689 Coagulation defect, unspecified: Secondary | ICD-10-CM | POA: Diagnosis not present

## 2019-04-20 DIAGNOSIS — Z992 Dependence on renal dialysis: Secondary | ICD-10-CM | POA: Diagnosis not present

## 2019-04-20 DIAGNOSIS — D509 Iron deficiency anemia, unspecified: Secondary | ICD-10-CM | POA: Diagnosis not present

## 2019-04-20 DIAGNOSIS — N186 End stage renal disease: Secondary | ICD-10-CM | POA: Diagnosis not present

## 2019-04-20 DIAGNOSIS — E876 Hypokalemia: Secondary | ICD-10-CM | POA: Diagnosis not present

## 2019-04-20 DIAGNOSIS — N2581 Secondary hyperparathyroidism of renal origin: Secondary | ICD-10-CM | POA: Diagnosis not present

## 2019-04-20 DIAGNOSIS — D631 Anemia in chronic kidney disease: Secondary | ICD-10-CM | POA: Diagnosis not present

## 2019-04-22 DIAGNOSIS — E876 Hypokalemia: Secondary | ICD-10-CM | POA: Diagnosis not present

## 2019-04-22 DIAGNOSIS — D509 Iron deficiency anemia, unspecified: Secondary | ICD-10-CM | POA: Diagnosis not present

## 2019-04-22 DIAGNOSIS — D631 Anemia in chronic kidney disease: Secondary | ICD-10-CM | POA: Diagnosis not present

## 2019-04-22 DIAGNOSIS — N2581 Secondary hyperparathyroidism of renal origin: Secondary | ICD-10-CM | POA: Diagnosis not present

## 2019-04-22 DIAGNOSIS — N186 End stage renal disease: Secondary | ICD-10-CM | POA: Diagnosis not present

## 2019-04-22 DIAGNOSIS — D689 Coagulation defect, unspecified: Secondary | ICD-10-CM | POA: Diagnosis not present

## 2019-04-22 DIAGNOSIS — Z992 Dependence on renal dialysis: Secondary | ICD-10-CM | POA: Diagnosis not present

## 2019-04-24 DIAGNOSIS — N186 End stage renal disease: Secondary | ICD-10-CM | POA: Diagnosis not present

## 2019-04-24 DIAGNOSIS — I509 Heart failure, unspecified: Secondary | ICD-10-CM | POA: Diagnosis not present

## 2019-04-24 DIAGNOSIS — I4891 Unspecified atrial fibrillation: Secondary | ICD-10-CM | POA: Diagnosis not present

## 2019-04-24 DIAGNOSIS — I132 Hypertensive heart and chronic kidney disease with heart failure and with stage 5 chronic kidney disease, or end stage renal disease: Secondary | ICD-10-CM | POA: Diagnosis not present

## 2019-04-24 DIAGNOSIS — J9621 Acute and chronic respiratory failure with hypoxia: Secondary | ICD-10-CM | POA: Diagnosis not present

## 2019-04-25 DIAGNOSIS — D509 Iron deficiency anemia, unspecified: Secondary | ICD-10-CM | POA: Diagnosis not present

## 2019-04-25 DIAGNOSIS — N186 End stage renal disease: Secondary | ICD-10-CM | POA: Diagnosis not present

## 2019-04-25 DIAGNOSIS — Z992 Dependence on renal dialysis: Secondary | ICD-10-CM | POA: Diagnosis not present

## 2019-04-25 DIAGNOSIS — D689 Coagulation defect, unspecified: Secondary | ICD-10-CM | POA: Diagnosis not present

## 2019-04-25 DIAGNOSIS — E876 Hypokalemia: Secondary | ICD-10-CM | POA: Diagnosis not present

## 2019-04-25 DIAGNOSIS — N2581 Secondary hyperparathyroidism of renal origin: Secondary | ICD-10-CM | POA: Diagnosis not present

## 2019-04-25 DIAGNOSIS — D631 Anemia in chronic kidney disease: Secondary | ICD-10-CM | POA: Diagnosis not present

## 2019-04-27 DIAGNOSIS — E876 Hypokalemia: Secondary | ICD-10-CM | POA: Diagnosis not present

## 2019-04-27 DIAGNOSIS — N186 End stage renal disease: Secondary | ICD-10-CM | POA: Diagnosis not present

## 2019-04-27 DIAGNOSIS — D509 Iron deficiency anemia, unspecified: Secondary | ICD-10-CM | POA: Diagnosis not present

## 2019-04-27 DIAGNOSIS — Z992 Dependence on renal dialysis: Secondary | ICD-10-CM | POA: Diagnosis not present

## 2019-04-27 DIAGNOSIS — N2581 Secondary hyperparathyroidism of renal origin: Secondary | ICD-10-CM | POA: Diagnosis not present

## 2019-04-27 DIAGNOSIS — D689 Coagulation defect, unspecified: Secondary | ICD-10-CM | POA: Diagnosis not present

## 2019-04-27 DIAGNOSIS — D631 Anemia in chronic kidney disease: Secondary | ICD-10-CM | POA: Diagnosis not present

## 2019-04-28 DIAGNOSIS — M546 Pain in thoracic spine: Secondary | ICD-10-CM | POA: Diagnosis not present

## 2019-04-29 DIAGNOSIS — E876 Hypokalemia: Secondary | ICD-10-CM | POA: Diagnosis not present

## 2019-04-29 DIAGNOSIS — D631 Anemia in chronic kidney disease: Secondary | ICD-10-CM | POA: Diagnosis not present

## 2019-04-29 DIAGNOSIS — D509 Iron deficiency anemia, unspecified: Secondary | ICD-10-CM | POA: Diagnosis not present

## 2019-04-29 DIAGNOSIS — N2581 Secondary hyperparathyroidism of renal origin: Secondary | ICD-10-CM | POA: Diagnosis not present

## 2019-04-29 DIAGNOSIS — Z992 Dependence on renal dialysis: Secondary | ICD-10-CM | POA: Diagnosis not present

## 2019-04-29 DIAGNOSIS — N186 End stage renal disease: Secondary | ICD-10-CM | POA: Diagnosis not present

## 2019-04-29 DIAGNOSIS — D689 Coagulation defect, unspecified: Secondary | ICD-10-CM | POA: Diagnosis not present

## 2019-05-02 DIAGNOSIS — D689 Coagulation defect, unspecified: Secondary | ICD-10-CM | POA: Diagnosis not present

## 2019-05-02 DIAGNOSIS — E876 Hypokalemia: Secondary | ICD-10-CM | POA: Diagnosis not present

## 2019-05-02 DIAGNOSIS — N2581 Secondary hyperparathyroidism of renal origin: Secondary | ICD-10-CM | POA: Diagnosis not present

## 2019-05-02 DIAGNOSIS — Z992 Dependence on renal dialysis: Secondary | ICD-10-CM | POA: Diagnosis not present

## 2019-05-02 DIAGNOSIS — D631 Anemia in chronic kidney disease: Secondary | ICD-10-CM | POA: Diagnosis not present

## 2019-05-02 DIAGNOSIS — N186 End stage renal disease: Secondary | ICD-10-CM | POA: Diagnosis not present

## 2019-05-02 DIAGNOSIS — D509 Iron deficiency anemia, unspecified: Secondary | ICD-10-CM | POA: Diagnosis not present

## 2019-05-04 DIAGNOSIS — D689 Coagulation defect, unspecified: Secondary | ICD-10-CM | POA: Diagnosis not present

## 2019-05-04 DIAGNOSIS — D631 Anemia in chronic kidney disease: Secondary | ICD-10-CM | POA: Diagnosis not present

## 2019-05-04 DIAGNOSIS — E876 Hypokalemia: Secondary | ICD-10-CM | POA: Diagnosis not present

## 2019-05-04 DIAGNOSIS — Z992 Dependence on renal dialysis: Secondary | ICD-10-CM | POA: Diagnosis not present

## 2019-05-04 DIAGNOSIS — N186 End stage renal disease: Secondary | ICD-10-CM | POA: Diagnosis not present

## 2019-05-04 DIAGNOSIS — N2581 Secondary hyperparathyroidism of renal origin: Secondary | ICD-10-CM | POA: Diagnosis not present

## 2019-05-04 DIAGNOSIS — D509 Iron deficiency anemia, unspecified: Secondary | ICD-10-CM | POA: Diagnosis not present

## 2019-05-05 DIAGNOSIS — J9621 Acute and chronic respiratory failure with hypoxia: Secondary | ICD-10-CM | POA: Diagnosis not present

## 2019-05-05 DIAGNOSIS — I4891 Unspecified atrial fibrillation: Secondary | ICD-10-CM | POA: Diagnosis not present

## 2019-05-05 DIAGNOSIS — I509 Heart failure, unspecified: Secondary | ICD-10-CM | POA: Diagnosis not present

## 2019-05-05 DIAGNOSIS — N186 End stage renal disease: Secondary | ICD-10-CM | POA: Diagnosis not present

## 2019-05-05 DIAGNOSIS — I132 Hypertensive heart and chronic kidney disease with heart failure and with stage 5 chronic kidney disease, or end stage renal disease: Secondary | ICD-10-CM | POA: Diagnosis not present

## 2019-05-06 DIAGNOSIS — D631 Anemia in chronic kidney disease: Secondary | ICD-10-CM | POA: Diagnosis not present

## 2019-05-06 DIAGNOSIS — Z992 Dependence on renal dialysis: Secondary | ICD-10-CM | POA: Diagnosis not present

## 2019-05-06 DIAGNOSIS — N186 End stage renal disease: Secondary | ICD-10-CM | POA: Diagnosis not present

## 2019-05-06 DIAGNOSIS — E876 Hypokalemia: Secondary | ICD-10-CM | POA: Diagnosis not present

## 2019-05-06 DIAGNOSIS — N2581 Secondary hyperparathyroidism of renal origin: Secondary | ICD-10-CM | POA: Diagnosis not present

## 2019-05-06 DIAGNOSIS — D509 Iron deficiency anemia, unspecified: Secondary | ICD-10-CM | POA: Diagnosis not present

## 2019-05-06 DIAGNOSIS — D689 Coagulation defect, unspecified: Secondary | ICD-10-CM | POA: Diagnosis not present

## 2019-05-08 ENCOUNTER — Telehealth: Payer: Self-pay

## 2019-05-08 NOTE — Telephone Encounter (Signed)
Signed and put in box to go up front.  

## 2019-05-08 NOTE — Telephone Encounter (Signed)
Received fax to extend PT for pt. PCP will review and sign, if appropriate.

## 2019-05-09 DIAGNOSIS — N2581 Secondary hyperparathyroidism of renal origin: Secondary | ICD-10-CM | POA: Diagnosis not present

## 2019-05-09 DIAGNOSIS — D631 Anemia in chronic kidney disease: Secondary | ICD-10-CM | POA: Diagnosis not present

## 2019-05-09 DIAGNOSIS — D509 Iron deficiency anemia, unspecified: Secondary | ICD-10-CM | POA: Diagnosis not present

## 2019-05-09 DIAGNOSIS — D689 Coagulation defect, unspecified: Secondary | ICD-10-CM | POA: Diagnosis not present

## 2019-05-09 DIAGNOSIS — E876 Hypokalemia: Secondary | ICD-10-CM | POA: Diagnosis not present

## 2019-05-09 DIAGNOSIS — N186 End stage renal disease: Secondary | ICD-10-CM | POA: Diagnosis not present

## 2019-05-09 DIAGNOSIS — Z992 Dependence on renal dialysis: Secondary | ICD-10-CM | POA: Diagnosis not present

## 2019-05-10 DIAGNOSIS — I132 Hypertensive heart and chronic kidney disease with heart failure and with stage 5 chronic kidney disease, or end stage renal disease: Secondary | ICD-10-CM | POA: Diagnosis not present

## 2019-05-10 DIAGNOSIS — J9621 Acute and chronic respiratory failure with hypoxia: Secondary | ICD-10-CM | POA: Diagnosis not present

## 2019-05-10 DIAGNOSIS — I509 Heart failure, unspecified: Secondary | ICD-10-CM | POA: Diagnosis not present

## 2019-05-10 DIAGNOSIS — I4891 Unspecified atrial fibrillation: Secondary | ICD-10-CM | POA: Diagnosis not present

## 2019-05-10 DIAGNOSIS — N186 End stage renal disease: Secondary | ICD-10-CM | POA: Diagnosis not present

## 2019-05-11 DIAGNOSIS — N2581 Secondary hyperparathyroidism of renal origin: Secondary | ICD-10-CM | POA: Diagnosis not present

## 2019-05-11 DIAGNOSIS — D631 Anemia in chronic kidney disease: Secondary | ICD-10-CM | POA: Diagnosis not present

## 2019-05-11 DIAGNOSIS — Z992 Dependence on renal dialysis: Secondary | ICD-10-CM | POA: Diagnosis not present

## 2019-05-11 DIAGNOSIS — E876 Hypokalemia: Secondary | ICD-10-CM | POA: Diagnosis not present

## 2019-05-11 DIAGNOSIS — D689 Coagulation defect, unspecified: Secondary | ICD-10-CM | POA: Diagnosis not present

## 2019-05-11 DIAGNOSIS — N186 End stage renal disease: Secondary | ICD-10-CM | POA: Diagnosis not present

## 2019-05-11 DIAGNOSIS — D509 Iron deficiency anemia, unspecified: Secondary | ICD-10-CM | POA: Diagnosis not present

## 2019-05-14 DIAGNOSIS — N186 End stage renal disease: Secondary | ICD-10-CM | POA: Diagnosis not present

## 2019-05-14 DIAGNOSIS — N2581 Secondary hyperparathyroidism of renal origin: Secondary | ICD-10-CM | POA: Diagnosis not present

## 2019-05-14 DIAGNOSIS — E876 Hypokalemia: Secondary | ICD-10-CM | POA: Diagnosis not present

## 2019-05-14 DIAGNOSIS — D631 Anemia in chronic kidney disease: Secondary | ICD-10-CM | POA: Diagnosis not present

## 2019-05-14 DIAGNOSIS — Z992 Dependence on renal dialysis: Secondary | ICD-10-CM | POA: Diagnosis not present

## 2019-05-14 DIAGNOSIS — D509 Iron deficiency anemia, unspecified: Secondary | ICD-10-CM | POA: Diagnosis not present

## 2019-05-14 DIAGNOSIS — D689 Coagulation defect, unspecified: Secondary | ICD-10-CM | POA: Diagnosis not present

## 2019-05-16 DIAGNOSIS — D631 Anemia in chronic kidney disease: Secondary | ICD-10-CM | POA: Diagnosis not present

## 2019-05-16 DIAGNOSIS — N186 End stage renal disease: Secondary | ICD-10-CM | POA: Diagnosis not present

## 2019-05-16 DIAGNOSIS — D689 Coagulation defect, unspecified: Secondary | ICD-10-CM | POA: Diagnosis not present

## 2019-05-16 DIAGNOSIS — D509 Iron deficiency anemia, unspecified: Secondary | ICD-10-CM | POA: Diagnosis not present

## 2019-05-16 DIAGNOSIS — N2581 Secondary hyperparathyroidism of renal origin: Secondary | ICD-10-CM | POA: Diagnosis not present

## 2019-05-16 DIAGNOSIS — Z992 Dependence on renal dialysis: Secondary | ICD-10-CM | POA: Diagnosis not present

## 2019-05-16 DIAGNOSIS — E876 Hypokalemia: Secondary | ICD-10-CM | POA: Diagnosis not present

## 2019-05-17 DIAGNOSIS — I509 Heart failure, unspecified: Secondary | ICD-10-CM | POA: Diagnosis not present

## 2019-05-17 DIAGNOSIS — N186 End stage renal disease: Secondary | ICD-10-CM | POA: Diagnosis not present

## 2019-05-17 DIAGNOSIS — J9621 Acute and chronic respiratory failure with hypoxia: Secondary | ICD-10-CM | POA: Diagnosis not present

## 2019-05-17 DIAGNOSIS — I4891 Unspecified atrial fibrillation: Secondary | ICD-10-CM | POA: Diagnosis not present

## 2019-05-17 DIAGNOSIS — I132 Hypertensive heart and chronic kidney disease with heart failure and with stage 5 chronic kidney disease, or end stage renal disease: Secondary | ICD-10-CM | POA: Diagnosis not present

## 2019-05-18 ENCOUNTER — Other Ambulatory Visit: Payer: Self-pay | Admitting: Internal Medicine

## 2019-05-18 DIAGNOSIS — Z992 Dependence on renal dialysis: Secondary | ICD-10-CM | POA: Diagnosis not present

## 2019-05-18 DIAGNOSIS — I120 Hypertensive chronic kidney disease with stage 5 chronic kidney disease or end stage renal disease: Secondary | ICD-10-CM | POA: Diagnosis not present

## 2019-05-18 DIAGNOSIS — D631 Anemia in chronic kidney disease: Secondary | ICD-10-CM | POA: Diagnosis not present

## 2019-05-18 DIAGNOSIS — N2581 Secondary hyperparathyroidism of renal origin: Secondary | ICD-10-CM | POA: Diagnosis not present

## 2019-05-18 DIAGNOSIS — D509 Iron deficiency anemia, unspecified: Secondary | ICD-10-CM | POA: Diagnosis not present

## 2019-05-18 DIAGNOSIS — D689 Coagulation defect, unspecified: Secondary | ICD-10-CM | POA: Diagnosis not present

## 2019-05-18 DIAGNOSIS — E876 Hypokalemia: Secondary | ICD-10-CM | POA: Diagnosis not present

## 2019-05-18 DIAGNOSIS — I480 Paroxysmal atrial fibrillation: Secondary | ICD-10-CM

## 2019-05-18 DIAGNOSIS — N186 End stage renal disease: Secondary | ICD-10-CM | POA: Diagnosis not present

## 2019-05-21 DIAGNOSIS — N186 End stage renal disease: Secondary | ICD-10-CM | POA: Diagnosis not present

## 2019-05-21 DIAGNOSIS — Z992 Dependence on renal dialysis: Secondary | ICD-10-CM | POA: Diagnosis not present

## 2019-05-21 DIAGNOSIS — N2581 Secondary hyperparathyroidism of renal origin: Secondary | ICD-10-CM | POA: Diagnosis not present

## 2019-05-21 DIAGNOSIS — D689 Coagulation defect, unspecified: Secondary | ICD-10-CM | POA: Diagnosis not present

## 2019-05-21 DIAGNOSIS — E876 Hypokalemia: Secondary | ICD-10-CM | POA: Diagnosis not present

## 2019-05-23 ENCOUNTER — Telehealth: Payer: Self-pay

## 2019-05-23 DIAGNOSIS — D689 Coagulation defect, unspecified: Secondary | ICD-10-CM | POA: Diagnosis not present

## 2019-05-23 DIAGNOSIS — Z992 Dependence on renal dialysis: Secondary | ICD-10-CM | POA: Diagnosis not present

## 2019-05-23 DIAGNOSIS — N2581 Secondary hyperparathyroidism of renal origin: Secondary | ICD-10-CM | POA: Diagnosis not present

## 2019-05-23 DIAGNOSIS — E876 Hypokalemia: Secondary | ICD-10-CM | POA: Diagnosis not present

## 2019-05-23 DIAGNOSIS — N186 End stage renal disease: Secondary | ICD-10-CM | POA: Diagnosis not present

## 2019-05-23 NOTE — Telephone Encounter (Signed)
Received certificate of medical necessity regarding oxygen for pt. LM for pt's daughter to return call to determine if pt is still needing or using oxygen.

## 2019-05-24 DIAGNOSIS — N186 End stage renal disease: Secondary | ICD-10-CM | POA: Diagnosis not present

## 2019-05-24 DIAGNOSIS — I509 Heart failure, unspecified: Secondary | ICD-10-CM | POA: Diagnosis not present

## 2019-05-24 DIAGNOSIS — I132 Hypertensive heart and chronic kidney disease with heart failure and with stage 5 chronic kidney disease, or end stage renal disease: Secondary | ICD-10-CM | POA: Diagnosis not present

## 2019-05-24 DIAGNOSIS — J9621 Acute and chronic respiratory failure with hypoxia: Secondary | ICD-10-CM | POA: Diagnosis not present

## 2019-05-24 DIAGNOSIS — I4891 Unspecified atrial fibrillation: Secondary | ICD-10-CM | POA: Diagnosis not present

## 2019-05-25 DIAGNOSIS — Z992 Dependence on renal dialysis: Secondary | ICD-10-CM | POA: Diagnosis not present

## 2019-05-25 DIAGNOSIS — N2581 Secondary hyperparathyroidism of renal origin: Secondary | ICD-10-CM | POA: Diagnosis not present

## 2019-05-25 DIAGNOSIS — D689 Coagulation defect, unspecified: Secondary | ICD-10-CM | POA: Diagnosis not present

## 2019-05-25 DIAGNOSIS — E876 Hypokalemia: Secondary | ICD-10-CM | POA: Diagnosis not present

## 2019-05-25 DIAGNOSIS — N186 End stage renal disease: Secondary | ICD-10-CM | POA: Diagnosis not present

## 2019-05-26 ENCOUNTER — Telehealth: Payer: Self-pay

## 2019-05-26 NOTE — Telephone Encounter (Signed)
Received order from [facility] to extend PT 1wk1 and certificate of medical neccesity. PCP will review and sign, if appropriate.

## 2019-05-27 DIAGNOSIS — E876 Hypokalemia: Secondary | ICD-10-CM | POA: Diagnosis not present

## 2019-05-27 DIAGNOSIS — N186 End stage renal disease: Secondary | ICD-10-CM | POA: Diagnosis not present

## 2019-05-27 DIAGNOSIS — Z992 Dependence on renal dialysis: Secondary | ICD-10-CM | POA: Diagnosis not present

## 2019-05-27 DIAGNOSIS — N2581 Secondary hyperparathyroidism of renal origin: Secondary | ICD-10-CM | POA: Diagnosis not present

## 2019-05-27 DIAGNOSIS — D689 Coagulation defect, unspecified: Secondary | ICD-10-CM | POA: Diagnosis not present

## 2019-05-29 NOTE — Telephone Encounter (Signed)
Signed and put in box to go up front.  

## 2019-05-29 NOTE — Telephone Encounter (Signed)
Pt's daughter, [name] states pt is not on oxygen. Machine has been returned. Form given to PCP to fill out for billing/insurance purposes only.

## 2019-05-30 DIAGNOSIS — N186 End stage renal disease: Secondary | ICD-10-CM | POA: Diagnosis not present

## 2019-05-30 DIAGNOSIS — D689 Coagulation defect, unspecified: Secondary | ICD-10-CM | POA: Diagnosis not present

## 2019-05-30 DIAGNOSIS — N2581 Secondary hyperparathyroidism of renal origin: Secondary | ICD-10-CM | POA: Diagnosis not present

## 2019-05-30 DIAGNOSIS — E876 Hypokalemia: Secondary | ICD-10-CM | POA: Diagnosis not present

## 2019-05-30 DIAGNOSIS — Z992 Dependence on renal dialysis: Secondary | ICD-10-CM | POA: Diagnosis not present

## 2019-05-31 DIAGNOSIS — H04123 Dry eye syndrome of bilateral lacrimal glands: Secondary | ICD-10-CM | POA: Diagnosis not present

## 2019-05-31 DIAGNOSIS — H35033 Hypertensive retinopathy, bilateral: Secondary | ICD-10-CM | POA: Diagnosis not present

## 2019-05-31 DIAGNOSIS — H53452 Other localized visual field defect, left eye: Secondary | ICD-10-CM | POA: Diagnosis not present

## 2019-05-31 DIAGNOSIS — H40013 Open angle with borderline findings, low risk, bilateral: Secondary | ICD-10-CM | POA: Diagnosis not present

## 2019-06-01 DIAGNOSIS — N2581 Secondary hyperparathyroidism of renal origin: Secondary | ICD-10-CM | POA: Diagnosis not present

## 2019-06-01 DIAGNOSIS — E876 Hypokalemia: Secondary | ICD-10-CM | POA: Diagnosis not present

## 2019-06-01 DIAGNOSIS — N186 End stage renal disease: Secondary | ICD-10-CM | POA: Diagnosis not present

## 2019-06-01 DIAGNOSIS — D689 Coagulation defect, unspecified: Secondary | ICD-10-CM | POA: Diagnosis not present

## 2019-06-01 DIAGNOSIS — Z992 Dependence on renal dialysis: Secondary | ICD-10-CM | POA: Diagnosis not present

## 2019-06-03 DIAGNOSIS — N2581 Secondary hyperparathyroidism of renal origin: Secondary | ICD-10-CM | POA: Diagnosis not present

## 2019-06-03 DIAGNOSIS — N186 End stage renal disease: Secondary | ICD-10-CM | POA: Diagnosis not present

## 2019-06-03 DIAGNOSIS — D689 Coagulation defect, unspecified: Secondary | ICD-10-CM | POA: Diagnosis not present

## 2019-06-03 DIAGNOSIS — E876 Hypokalemia: Secondary | ICD-10-CM | POA: Diagnosis not present

## 2019-06-03 DIAGNOSIS — Z992 Dependence on renal dialysis: Secondary | ICD-10-CM | POA: Diagnosis not present

## 2019-06-07 ENCOUNTER — Ambulatory Visit: Payer: Medicare Other | Attending: Internal Medicine

## 2019-06-07 DIAGNOSIS — N186 End stage renal disease: Secondary | ICD-10-CM | POA: Diagnosis not present

## 2019-06-07 DIAGNOSIS — N2581 Secondary hyperparathyroidism of renal origin: Secondary | ICD-10-CM | POA: Diagnosis not present

## 2019-06-07 DIAGNOSIS — Z992 Dependence on renal dialysis: Secondary | ICD-10-CM | POA: Diagnosis not present

## 2019-06-07 DIAGNOSIS — E876 Hypokalemia: Secondary | ICD-10-CM | POA: Diagnosis not present

## 2019-06-07 DIAGNOSIS — Z23 Encounter for immunization: Secondary | ICD-10-CM | POA: Insufficient documentation

## 2019-06-07 DIAGNOSIS — D689 Coagulation defect, unspecified: Secondary | ICD-10-CM | POA: Diagnosis not present

## 2019-06-07 NOTE — Progress Notes (Signed)
   Covid-19 Vaccination Clinic  Name:  Damira Ardinger    MRN: [id number] DOB: [birth year]  06/07/2019  Ms. Luevano was observed post Covid-19 immunization for 15 minutes without incidence. She was provided with Vaccine Information Sheet and instruction to access the V-Safe system.   Ms. Groah was instructed to call 911 with any severe reactions post vaccine: [name] Difficulty breathing  . Swelling of your face and throat  . A fast heartbeat  . A bad rash all over your body  . Dizziness and weakness    Immunizations Administered    Name Date Dose VIS Date Route   Pfizer COVID-19 Vaccine 06/07/2019 12:01 PM 0.3 mL 04/28/2019 Intramuscular   Manufacturer: [employer]   Lot: [id number]   [place]: [id number]

## 2019-06-08 DIAGNOSIS — D689 Coagulation defect, unspecified: Secondary | ICD-10-CM | POA: Diagnosis not present

## 2019-06-08 DIAGNOSIS — N2581 Secondary hyperparathyroidism of renal origin: Secondary | ICD-10-CM | POA: Diagnosis not present

## 2019-06-08 DIAGNOSIS — Z992 Dependence on renal dialysis: Secondary | ICD-10-CM | POA: Diagnosis not present

## 2019-06-08 DIAGNOSIS — E876 Hypokalemia: Secondary | ICD-10-CM | POA: Diagnosis not present

## 2019-06-08 DIAGNOSIS — N186 End stage renal disease: Secondary | ICD-10-CM | POA: Diagnosis not present

## 2019-06-09 ENCOUNTER — Telehealth: Payer: Self-pay

## 2019-06-09 NOTE — Telephone Encounter (Signed)
Left message for patient to remind of missed remote transmission.  

## 2019-06-10 DIAGNOSIS — Z992 Dependence on renal dialysis: Secondary | ICD-10-CM | POA: Diagnosis not present

## 2019-06-10 DIAGNOSIS — N186 End stage renal disease: Secondary | ICD-10-CM | POA: Diagnosis not present

## 2019-06-10 DIAGNOSIS — N2581 Secondary hyperparathyroidism of renal origin: Secondary | ICD-10-CM | POA: Diagnosis not present

## 2019-06-10 DIAGNOSIS — D689 Coagulation defect, unspecified: Secondary | ICD-10-CM | POA: Diagnosis not present

## 2019-06-10 DIAGNOSIS — E876 Hypokalemia: Secondary | ICD-10-CM | POA: Diagnosis not present

## 2019-06-13 DIAGNOSIS — Z992 Dependence on renal dialysis: Secondary | ICD-10-CM | POA: Diagnosis not present

## 2019-06-13 DIAGNOSIS — N2581 Secondary hyperparathyroidism of renal origin: Secondary | ICD-10-CM | POA: Diagnosis not present

## 2019-06-13 DIAGNOSIS — D689 Coagulation defect, unspecified: Secondary | ICD-10-CM | POA: Diagnosis not present

## 2019-06-13 DIAGNOSIS — E876 Hypokalemia: Secondary | ICD-10-CM | POA: Diagnosis not present

## 2019-06-13 DIAGNOSIS — N186 End stage renal disease: Secondary | ICD-10-CM | POA: Diagnosis not present

## 2019-06-15 DIAGNOSIS — Z992 Dependence on renal dialysis: Secondary | ICD-10-CM | POA: Diagnosis not present

## 2019-06-15 DIAGNOSIS — N2581 Secondary hyperparathyroidism of renal origin: Secondary | ICD-10-CM | POA: Diagnosis not present

## 2019-06-15 DIAGNOSIS — D689 Coagulation defect, unspecified: Secondary | ICD-10-CM | POA: Diagnosis not present

## 2019-06-15 DIAGNOSIS — N186 End stage renal disease: Secondary | ICD-10-CM | POA: Diagnosis not present

## 2019-06-15 DIAGNOSIS — E876 Hypokalemia: Secondary | ICD-10-CM | POA: Diagnosis not present

## 2019-06-17 DIAGNOSIS — D689 Coagulation defect, unspecified: Secondary | ICD-10-CM | POA: Diagnosis not present

## 2019-06-17 DIAGNOSIS — N186 End stage renal disease: Secondary | ICD-10-CM | POA: Diagnosis not present

## 2019-06-17 DIAGNOSIS — Z992 Dependence on renal dialysis: Secondary | ICD-10-CM | POA: Diagnosis not present

## 2019-06-17 DIAGNOSIS — E876 Hypokalemia: Secondary | ICD-10-CM | POA: Diagnosis not present

## 2019-06-17 DIAGNOSIS — N2581 Secondary hyperparathyroidism of renal origin: Secondary | ICD-10-CM | POA: Diagnosis not present

## 2019-06-18 DIAGNOSIS — Z992 Dependence on renal dialysis: Secondary | ICD-10-CM | POA: Diagnosis not present

## 2019-06-18 DIAGNOSIS — N186 End stage renal disease: Secondary | ICD-10-CM | POA: Diagnosis not present

## 2019-06-18 DIAGNOSIS — I120 Hypertensive chronic kidney disease with stage 5 chronic kidney disease or end stage renal disease: Secondary | ICD-10-CM | POA: Diagnosis not present

## 2019-06-19 ENCOUNTER — Ambulatory Visit (INDEPENDENT_AMBULATORY_CARE_PROVIDER_SITE_OTHER): Payer: Medicare Other | Admitting: *Deleted

## 2019-06-19 DIAGNOSIS — I5042 Chronic combined systolic (congestive) and diastolic (congestive) heart failure: Secondary | ICD-10-CM

## 2019-06-20 DIAGNOSIS — D631 Anemia in chronic kidney disease: Secondary | ICD-10-CM | POA: Diagnosis not present

## 2019-06-20 DIAGNOSIS — Z992 Dependence on renal dialysis: Secondary | ICD-10-CM | POA: Diagnosis not present

## 2019-06-20 DIAGNOSIS — E876 Hypokalemia: Secondary | ICD-10-CM | POA: Diagnosis not present

## 2019-06-20 DIAGNOSIS — D509 Iron deficiency anemia, unspecified: Secondary | ICD-10-CM | POA: Diagnosis not present

## 2019-06-20 DIAGNOSIS — D689 Coagulation defect, unspecified: Secondary | ICD-10-CM | POA: Diagnosis not present

## 2019-06-20 DIAGNOSIS — N186 End stage renal disease: Secondary | ICD-10-CM | POA: Diagnosis not present

## 2019-06-20 DIAGNOSIS — N2581 Secondary hyperparathyroidism of renal origin: Secondary | ICD-10-CM | POA: Diagnosis not present

## 2019-06-20 LAB — CUP PACEART REMOTE DEVICE CHECK
Battery Remaining Longevity: 124 mo
Battery Remaining Percentage: 95.5 %
Battery Voltage: 3.02 V
Brady Statistic AP VP Percent: 1 %
Brady Statistic AP VS Percent: 1.2 %
Brady Statistic AS VP Percent: 1 %
Brady Statistic AS VS Percent: 99 %
Brady Statistic RA Percent Paced: 1.2 %
Brady Statistic RV Percent Paced: 1 %
Date Time Interrogation Session: 20210201160727
Implantable Lead Implant Date: 20180719
Implantable Lead Implant Date: 20180719
Implantable Lead Location: 753859
Implantable Lead Location: 753860
Implantable Pulse Generator Implant Date: 20180719
Lead Channel Impedance Value: 400 Ohm
Lead Channel Impedance Value: 430 Ohm
Lead Channel Pacing Threshold Amplitude: 0.75 V
Lead Channel Pacing Threshold Amplitude: 0.75 V
Lead Channel Pacing Threshold Pulse Width: 0.5 ms
Lead Channel Pacing Threshold Pulse Width: 0.5 ms
Lead Channel Sensing Intrinsic Amplitude: 3.3 mV
Lead Channel Sensing Intrinsic Amplitude: 8.8 mV
Lead Channel Setting Pacing Amplitude: 2 V
Lead Channel Setting Pacing Amplitude: 2.5 V
Lead Channel Setting Pacing Pulse Width: 0.5 ms
Lead Channel Setting Sensing Sensitivity: 2 mV
Pulse Gen Model: 2272
Pulse Gen Serial Number: 8918511

## 2019-06-22 DIAGNOSIS — E876 Hypokalemia: Secondary | ICD-10-CM | POA: Diagnosis not present

## 2019-06-22 DIAGNOSIS — N186 End stage renal disease: Secondary | ICD-10-CM | POA: Diagnosis not present

## 2019-06-22 DIAGNOSIS — D689 Coagulation defect, unspecified: Secondary | ICD-10-CM | POA: Diagnosis not present

## 2019-06-22 DIAGNOSIS — Z992 Dependence on renal dialysis: Secondary | ICD-10-CM | POA: Diagnosis not present

## 2019-06-22 DIAGNOSIS — N2581 Secondary hyperparathyroidism of renal origin: Secondary | ICD-10-CM | POA: Diagnosis not present

## 2019-06-22 DIAGNOSIS — D631 Anemia in chronic kidney disease: Secondary | ICD-10-CM | POA: Diagnosis not present

## 2019-06-22 DIAGNOSIS — D509 Iron deficiency anemia, unspecified: Secondary | ICD-10-CM | POA: Diagnosis not present

## 2019-06-24 DIAGNOSIS — D509 Iron deficiency anemia, unspecified: Secondary | ICD-10-CM | POA: Diagnosis not present

## 2019-06-24 DIAGNOSIS — D631 Anemia in chronic kidney disease: Secondary | ICD-10-CM | POA: Diagnosis not present

## 2019-06-24 DIAGNOSIS — Z992 Dependence on renal dialysis: Secondary | ICD-10-CM | POA: Diagnosis not present

## 2019-06-24 DIAGNOSIS — D689 Coagulation defect, unspecified: Secondary | ICD-10-CM | POA: Diagnosis not present

## 2019-06-24 DIAGNOSIS — N2581 Secondary hyperparathyroidism of renal origin: Secondary | ICD-10-CM | POA: Diagnosis not present

## 2019-06-24 DIAGNOSIS — E876 Hypokalemia: Secondary | ICD-10-CM | POA: Diagnosis not present

## 2019-06-24 DIAGNOSIS — N186 End stage renal disease: Secondary | ICD-10-CM | POA: Diagnosis not present

## 2019-06-26 ENCOUNTER — Encounter: Payer: Self-pay | Admitting: Family Medicine

## 2019-06-26 ENCOUNTER — Ambulatory Visit: Payer: Medicare Other | Attending: Internal Medicine

## 2019-06-26 DIAGNOSIS — Z23 Encounter for immunization: Secondary | ICD-10-CM | POA: Insufficient documentation

## 2019-06-26 NOTE — Telephone Encounter (Signed)
Sounds like this would best be addressed via office visit (telemed is fine).

## 2019-06-26 NOTE — Telephone Encounter (Signed)
Please advise if other suggestions?

## 2019-06-26 NOTE — Progress Notes (Signed)
   Covid-19 Vaccination Clinic  Name:  Valerie Terrell    MRN: [id number] DOB: [birth year]  06/26/2019  Valerie Terrell was observed post Covid-19 immunization for 15 minutes without incidence. She was provided with Vaccine Information Sheet and instruction to access the V-Safe system.   Valerie Terrell was instructed to call 911 with any severe reactions post vaccine: [name] Difficulty breathing  . Swelling of your face and throat  . A fast heartbeat  . A bad rash all over your body  . Dizziness and weakness    Immunizations Administered    Name Date Dose VIS Date Route   Pfizer COVID-19 Vaccine 06/26/2019  5:52 PM 0.3 mL 04/28/2019 Intramuscular   Manufacturer: [place]   Lot: [id number]   [place]: [id number]

## 2019-06-27 DIAGNOSIS — D509 Iron deficiency anemia, unspecified: Secondary | ICD-10-CM | POA: Diagnosis not present

## 2019-06-27 DIAGNOSIS — E876 Hypokalemia: Secondary | ICD-10-CM | POA: Diagnosis not present

## 2019-06-27 DIAGNOSIS — D631 Anemia in chronic kidney disease: Secondary | ICD-10-CM | POA: Diagnosis not present

## 2019-06-27 DIAGNOSIS — D689 Coagulation defect, unspecified: Secondary | ICD-10-CM | POA: Diagnosis not present

## 2019-06-27 DIAGNOSIS — Z992 Dependence on renal dialysis: Secondary | ICD-10-CM | POA: Diagnosis not present

## 2019-06-27 DIAGNOSIS — N186 End stage renal disease: Secondary | ICD-10-CM | POA: Diagnosis not present

## 2019-06-27 DIAGNOSIS — N2581 Secondary hyperparathyroidism of renal origin: Secondary | ICD-10-CM | POA: Diagnosis not present

## 2019-06-27 NOTE — Telephone Encounter (Signed)
Dr. Anitra Lauth has no availability until next Wednesday.  Please advise.  Please call daughter  [name]  [phone]

## 2019-06-27 NOTE — Telephone Encounter (Signed)
Patient's last visit was 04/05/19 and she was given Oxycodone #90. She is almost out and you have no available appointments until Wednesday 2/17.  Requesting: Oxycodone Contract:n/a UDS:n/a Last Visit:04/05/19 Next Visit:n/a Last Refill:04/05/19 (90,0)  Please Advise

## 2019-06-29 DIAGNOSIS — D509 Iron deficiency anemia, unspecified: Secondary | ICD-10-CM | POA: Diagnosis not present

## 2019-06-29 DIAGNOSIS — D689 Coagulation defect, unspecified: Secondary | ICD-10-CM | POA: Diagnosis not present

## 2019-06-29 DIAGNOSIS — E876 Hypokalemia: Secondary | ICD-10-CM | POA: Diagnosis not present

## 2019-06-29 DIAGNOSIS — N186 End stage renal disease: Secondary | ICD-10-CM | POA: Diagnosis not present

## 2019-06-29 DIAGNOSIS — D631 Anemia in chronic kidney disease: Secondary | ICD-10-CM | POA: Diagnosis not present

## 2019-06-29 DIAGNOSIS — N2581 Secondary hyperparathyroidism of renal origin: Secondary | ICD-10-CM | POA: Diagnosis not present

## 2019-06-29 DIAGNOSIS — Z992 Dependence on renal dialysis: Secondary | ICD-10-CM | POA: Diagnosis not present

## 2019-06-29 MED ORDER — OXYCODONE HCL 5 MG PO TABS: ORAL_TABLET | ORAL | 0 refills | Status: DC

## 2019-06-29 NOTE — Telephone Encounter (Signed)
Called patient to give her information and to schedule appt. There was no answer. Unable to leave VM

## 2019-06-29 NOTE — Telephone Encounter (Signed)
OK to put on for next Wednesday. I'll send in more oxycodone now.

## 2019-07-03 DIAGNOSIS — E876 Hypokalemia: Secondary | ICD-10-CM | POA: Diagnosis not present

## 2019-07-03 DIAGNOSIS — Z992 Dependence on renal dialysis: Secondary | ICD-10-CM | POA: Diagnosis not present

## 2019-07-03 DIAGNOSIS — N2581 Secondary hyperparathyroidism of renal origin: Secondary | ICD-10-CM | POA: Diagnosis not present

## 2019-07-03 DIAGNOSIS — D689 Coagulation defect, unspecified: Secondary | ICD-10-CM | POA: Diagnosis not present

## 2019-07-03 DIAGNOSIS — D631 Anemia in chronic kidney disease: Secondary | ICD-10-CM | POA: Diagnosis not present

## 2019-07-03 DIAGNOSIS — N186 End stage renal disease: Secondary | ICD-10-CM | POA: Diagnosis not present

## 2019-07-03 DIAGNOSIS — D509 Iron deficiency anemia, unspecified: Secondary | ICD-10-CM | POA: Diagnosis not present

## 2019-07-04 ENCOUNTER — Emergency Department (HOSPITAL_COMMUNITY): Payer: Medicare Other

## 2019-07-04 ENCOUNTER — Other Ambulatory Visit: Payer: Self-pay

## 2019-07-04 ENCOUNTER — Encounter (HOSPITAL_COMMUNITY): Payer: Self-pay

## 2019-07-04 ENCOUNTER — Emergency Department (HOSPITAL_COMMUNITY)
Admission: EM | Admit: 2019-07-04 | Discharge: 2019-07-04 | Disposition: A | Discharge status: Home / Self Care | Payer: Medicare Other | Attending: Emergency Medicine | Admitting: Emergency Medicine

## 2019-07-04 DIAGNOSIS — G8929 Other chronic pain: Secondary | ICD-10-CM | POA: Diagnosis not present

## 2019-07-04 DIAGNOSIS — R52 Pain, unspecified: Secondary | ICD-10-CM | POA: Diagnosis not present

## 2019-07-04 DIAGNOSIS — N186 End stage renal disease: Secondary | ICD-10-CM | POA: Diagnosis not present

## 2019-07-04 DIAGNOSIS — Z7982 Long term (current) use of aspirin: Secondary | ICD-10-CM | POA: Diagnosis not present

## 2019-07-04 DIAGNOSIS — I132 Hypertensive heart and chronic kidney disease with heart failure and with stage 5 chronic kidney disease, or end stage renal disease: Secondary | ICD-10-CM | POA: Diagnosis not present

## 2019-07-04 DIAGNOSIS — M546 Pain in thoracic spine: Secondary | ICD-10-CM | POA: Diagnosis not present

## 2019-07-04 DIAGNOSIS — I10 Essential (primary) hypertension: Secondary | ICD-10-CM | POA: Diagnosis not present

## 2019-07-04 DIAGNOSIS — Z992 Dependence on renal dialysis: Secondary | ICD-10-CM | POA: Insufficient documentation

## 2019-07-04 DIAGNOSIS — Z79899 Other long term (current) drug therapy: Secondary | ICD-10-CM | POA: Insufficient documentation

## 2019-07-04 DIAGNOSIS — I5042 Chronic combined systolic (congestive) and diastolic (congestive) heart failure: Secondary | ICD-10-CM | POA: Insufficient documentation

## 2019-07-04 DIAGNOSIS — Z743 Need for continuous supervision: Secondary | ICD-10-CM | POA: Diagnosis not present

## 2019-07-04 DIAGNOSIS — Z950 Presence of cardiac pacemaker: Secondary | ICD-10-CM | POA: Insufficient documentation

## 2019-07-04 DIAGNOSIS — I480 Paroxysmal atrial fibrillation: Secondary | ICD-10-CM | POA: Diagnosis not present

## 2019-07-04 DIAGNOSIS — Z87891 Personal history of nicotine dependence: Secondary | ICD-10-CM | POA: Diagnosis not present

## 2019-07-04 DIAGNOSIS — M545 Low back pain: Secondary | ICD-10-CM | POA: Diagnosis not present

## 2019-07-04 LAB — CBC
HCT: 37.3 % (ref 36.0–46.0)
Hemoglobin: 11.4 g/dL — ABNORMAL LOW (ref 12.0–15.0)
MCH: 30.2 pg (ref 26.0–34.0)
MCHC: 30.6 g/dL (ref 30.0–36.0)
MCV: 98.9 fL (ref 80.0–100.0)
Platelets: 235 10*3/uL (ref 150–400)
RBC: 3.77 MIL/uL — ABNORMAL LOW (ref 3.87–5.11)
RDW: 16.9 % — ABNORMAL HIGH (ref 11.5–15.5)
WBC: 9.5 10*3/uL (ref 4.0–10.5)
nRBC: 0 % (ref 0.0–0.2)

## 2019-07-04 LAB — BASIC METABOLIC PANEL
Anion gap: 15 (ref 5–15)
BUN: 13 mg/dL (ref 8–23)
CO2: 27 mmol/L (ref 22–32)
Calcium: 8.5 mg/dL — ABNORMAL LOW (ref 8.9–10.3)
Chloride: 94 mmol/L — ABNORMAL LOW (ref 98–111)
Creatinine, Ser: 4.67 mg/dL — ABNORMAL HIGH (ref 0.44–1.00)
GFR calc Af Amer: 9 mL/min — ABNORMAL LOW (ref >60)
GFR calc non Af Amer: 8 mL/min — ABNORMAL LOW (ref >60)
Glucose, Bld: 94 mg/dL (ref 70–99)
Potassium: 4.9 mmol/L (ref 3.5–5.1)
Sodium: 136 mmol/L (ref 135–145)

## 2019-07-04 MED ORDER — OXYCODONE HCL 5 MG PO TABS: 10.0000 mg | ORAL_TABLET | Freq: Once | ORAL | Status: DC

## 2019-07-04 MED ORDER — PREDNISONE 20 MG PO TABS: 40.0000 mg | ORAL_TABLET | Freq: Every day | ORAL | 0 refills | Status: DC

## 2019-07-04 MED ORDER — ONDANSETRON HCL 4 MG/2ML IJ SOLN
4.0000 mg | Freq: Once | INTRAMUSCULAR | Status: DC
  Filled 2019-07-04: qty 2

## 2019-07-04 MED ORDER — ONDANSETRON HCL 4 MG/2ML IJ SOLN: 4.0000 mg | INTRAMUSCULAR | Status: DC | PRN

## 2019-07-04 MED ORDER — HYDROMORPHONE HCL 1 MG/ML IJ SOLN
0.5000 mg | INTRAMUSCULAR | Status: AC
  Filled 2019-07-04: qty 1

## 2019-07-04 MED ORDER — MORPHINE SULFATE (PF) 4 MG/ML IV SOLN
4.0000 mg | INTRAVENOUS | Status: DC | PRN
  Administered 2019-07-04: 4 mg via INTRAVENOUS
  Filled 2019-07-04: qty 1
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
## 2019-07-04 NOTE — ED Triage Notes (Signed)
Pt from home with ems, pt reports severe back pain, daughter drove her to dialysis but was unable to receive treatment due to the pain. Pt takes oxycodone at home for pain without relief.  180/100 HR 80 rr18 98% on room air.

## 2019-07-04 NOTE — ED Provider Notes (Signed)
Medical screening examination/treatment/procedure(s) were conducted as a shared visit with non-physician practitioner(s) and myself.  I personally evaluated the patient during the encounter.  Clinical Impression:   Final diagnoses:  Acute midline thoracic back pain  Chronic bilateral thoracic back pain    This patient is an 86 year old female with multiple history of prior fractures of her spine, followed by pain management, presents with increasing pain that prevented her from getting a full treatment at dialysis today.  She is in no respiratory distress, has no numbness or weakness of the legs, is having ongoing pain in her back, initially given morphine which infiltrated, refused Dilaudid, accepted some oxycodone which she already takes at home.  States that she is frustrated by her chronic pain, gets some relief with injections that she gets in the office.  She is seeing the physician assistant at the spinal office tomorrow.  Does not need acute dialysis, labs reviewed, creatinine of 4.6, potassium of 4.9, oxygen normal on my exam at the bedside, no hypotension tachycardia or fever.  She has a normal respiratory pattern and speech pattern as well.  Patient agreeable to the follow-up plan, daughter at the bedside was also in agreement.   Noemi Chapel, MD 07/07/19 [phone]

## 2019-07-04 NOTE — ED Notes (Signed)
Patient was able to ambulate around the room, stated she felt okay getting around, no pain and felt okay

## 2019-07-04 NOTE — Telephone Encounter (Signed)
Pt has been scheduled for Friday at 1:30.

## 2019-07-04 NOTE — Discharge Instructions (Signed)
Please read and follow all provided instructions.  Your diagnoses today include:  1. Acute midline thoracic back pain   2. Chronic bilateral thoracic back pain     Tests performed today include:  Vital signs - see below for your results today  X-ray of the middle and lower back that did not show any significant new compression fractures or other problems  Blood cell counts and electrolytes -normal potassium  Medications prescribed:   Prednisone - steroid medicine   It is best to take this medication in the morning to prevent sleeping problems. If you are diabetic, monitor your blood sugar closely and stop taking Prednisone if blood sugar is over 300. Take with food to prevent stomach upset.   Take any prescribed medications only as directed.  Home care instructions:   Follow any educational materials contained in this packet  Please rest, use ice or heat on your back for the next several days  Do not lift, push, pull anything more than 10 pounds for the next week  Follow-up instructions: Please follow-up with your primary care provider in the next 2 days for further evaluation of your symptoms.   Call your dialysis center for instructions on any additional sessions that may be needed.  Return instructions:  SEEK IMMEDIATE MEDICAL ATTENTION IF YOU HAVE:  New numbness, tingling, weakness, or problem with the use of your arms or legs  Severe back pain not relieved with medications  Loss control of your bowels or bladder  Increasing pain in any areas of the body (such as chest or abdominal pain)  Shortness of breath, dizziness, or fainting.   Worsening nausea (feeling sick to your stomach), vomiting, fever, or sweats  Any other emergent concerns regarding your health   Additional Information:  Your vital signs today were: BP (!) 136/49   Pulse 75   Temp 98.1 F (36.7 C) (Oral)   Resp 13   LMP  (LMP Unknown)   SpO2 (!) 89%  If your blood pressure (BP) was  elevated above 135/85 this visit, please have this repeated by your doctor within one month. --------------
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
## 2019-07-05 ENCOUNTER — Encounter: Payer: Self-pay | Admitting: Family Medicine

## 2019-07-05 DIAGNOSIS — M546 Pain in thoracic spine: Secondary | ICD-10-CM | POA: Diagnosis not present

## 2019-07-05 NOTE — Telephone Encounter (Signed)
Patient went to ED yesterday for back pain but was not admitted. Patient unable to fully complete dialysis treatments. She is still scheduled for in office appt on Friday 2/19 but would like to know if fentanyl patches would help with pain.   Please advise, thanks.

## 2019-07-06 DIAGNOSIS — E876 Hypokalemia: Secondary | ICD-10-CM | POA: Diagnosis not present

## 2019-07-06 DIAGNOSIS — D689 Coagulation defect, unspecified: Secondary | ICD-10-CM | POA: Diagnosis not present

## 2019-07-06 DIAGNOSIS — Z992 Dependence on renal dialysis: Secondary | ICD-10-CM | POA: Diagnosis not present

## 2019-07-06 DIAGNOSIS — N2581 Secondary hyperparathyroidism of renal origin: Secondary | ICD-10-CM | POA: Diagnosis not present

## 2019-07-06 DIAGNOSIS — D509 Iron deficiency anemia, unspecified: Secondary | ICD-10-CM | POA: Diagnosis not present

## 2019-07-06 DIAGNOSIS — N186 End stage renal disease: Secondary | ICD-10-CM | POA: Diagnosis not present

## 2019-07-06 DIAGNOSIS — D631 Anemia in chronic kidney disease: Secondary | ICD-10-CM | POA: Diagnosis not present

## 2019-07-07 ENCOUNTER — Ambulatory Visit (INDEPENDENT_AMBULATORY_CARE_PROVIDER_SITE_OTHER): Payer: Medicare Other | Admitting: Family Medicine

## 2019-07-07 ENCOUNTER — Other Ambulatory Visit (HOSPITAL_COMMUNITY): Payer: Self-pay | Admitting: Chiropractic Medicine

## 2019-07-07 ENCOUNTER — Encounter: Payer: Self-pay | Admitting: Family Medicine

## 2019-07-07 ENCOUNTER — Other Ambulatory Visit: Payer: Self-pay

## 2019-07-07 VITALS — BP 117/68 | HR 68 | Temp 98.0°F | Resp 16 | Ht 59.0 in

## 2019-07-07 DIAGNOSIS — D509 Iron deficiency anemia, unspecified: Secondary | ICD-10-CM | POA: Diagnosis not present

## 2019-07-07 DIAGNOSIS — Z992 Dependence on renal dialysis: Secondary | ICD-10-CM | POA: Diagnosis not present

## 2019-07-07 DIAGNOSIS — M546 Pain in thoracic spine: Secondary | ICD-10-CM | POA: Diagnosis not present

## 2019-07-07 DIAGNOSIS — N186 End stage renal disease: Secondary | ICD-10-CM

## 2019-07-07 DIAGNOSIS — E876 Hypokalemia: Secondary | ICD-10-CM | POA: Diagnosis not present

## 2019-07-07 DIAGNOSIS — D631 Anemia in chronic kidney disease: Secondary | ICD-10-CM | POA: Diagnosis not present

## 2019-07-07 DIAGNOSIS — D689 Coagulation defect, unspecified: Secondary | ICD-10-CM | POA: Diagnosis not present

## 2019-07-07 DIAGNOSIS — G8929 Other chronic pain: Secondary | ICD-10-CM

## 2019-07-07 DIAGNOSIS — N2581 Secondary hyperparathyroidism of renal origin: Secondary | ICD-10-CM | POA: Diagnosis not present

## 2019-07-07 MED ORDER — ONDANSETRON HCL 4 MG PO TABS: 4.0000 mg | ORAL_TABLET | Freq: Three times a day (TID) | ORAL | 1 refills | Status: DC | PRN

## 2019-07-07 MED ORDER — OXYCODONE HCL 10 MG PO TABS: ORAL_TABLET | ORAL | 0 refills | Status: DC

## 2019-07-07 MED ORDER — FENTANYL 25 MCG/HR TD PT72: 1.0000 | MEDICATED_PATCH | TRANSDERMAL | 0 refills | Status: DC

## 2019-07-07 NOTE — Progress Notes (Signed)
OFFICE VISIT  07/07/2019   CC:  Chief Complaint  Patient presents with  . Follow-up    RCI, pt is not fasting   HPI:    Patient is a 86 y.o. Caucasian female who presents for f/u HTN, HLD, chronic debilitation/wheelchair bound, hx of chronic hypoxic resp failure,  hx of CHF, and hx of multiple fractures as a result of falls. She has chronic back/hip pain and takes opioid pain meds on a fairly regular basis.  She has significant past cardiac hx: PAF, sick sinus syndrome, hx of CHF.  She has ESRD and is on HD T/Th/Sat. She is accompanied by her daughter Bryson Ha, her primary caregiver.   Acute: severe mid/upper back pain focused mostly to the left of midline. Says it feels VERY SEVERE, 10/10 intensity, "like a compression fracture I had before".  Has hx of mild falls but never felt like she injured herself. She presented to the ED 07/04/19 for acute on chronic mid back pain.  This has been going on at least a week but progressing.  I reviewed all records from that visit today. Imaging negative for new compression fractures or other acute abnormality.  Normal lower extremity neuro exam.  Prednisone rx'd by ED provider but she has not started this med yet.  She saw Levy Pupa with ortho a few days ago and MRI T spine has been ordered and intranasal calcitonin rx'd for presumed compression fx pain tx. Pt has not filled this med rx yet.   PMP AWARE reviewed today: most recent rx for oxycodone 5mg  was filled 06/29/19, # 4, rx by me.  No red flags.  ROS: no fever, no CP, no SOB, no wheezing, no cough, no dizziness, no HAs, no rashes, no melena/hematochezia.  No polyuria or polydipsia.  No myalgias or arthralgias.   Past Medical History:  Diagnosis Date  . Anemia of chronic disease 2018   + anemia of CRI?  [name] Atrophy of left kidney    with absent blood flow by renal artery dopplers (Dr. Gwenlyn Found)  . 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 Chronic combined systolic and diastolic  CHF (congestive heart failure) ([place])    a. 11/2016: echo showing EF of 35-40%, RV strain noted, mild MR and mild TR.   Echo showed much improved EF 12/2017 (55-60%)  . Chronic respiratory failure with hypoxia ([place]) 02/11/2017   Now on chronic O2 (intermittent as of summer 2019).    . Closed fracture of right superior pubic ramus ([place]) 07/18/2018   Non op mgmt (Dr. Victorino December was ortho who saw her in hosp but i don't think pt followed up with him).  . Closed right hip fracture ([place]) 10/04/2018   ORIF  . Debilitated patient    WC dependent as of 2018.  Hosp bed + full assistance with most ADLs s/p hip fx surg 09/2018.  [name] ESRD on hemodialysis ([place]) 01/2017   T/Th/Sat schedule- [place].  Right basilic AV fistula [id number].  Graft 2019.  [name] History of adenomatous polyp of colon   . History of kidney stones   . History of subarachnoid hemorrhage 10/2014   after syncope and while on xarelto  . Hyperlipidemia   . Hypertension    Difficult to control, in the setting of one functioning kidney: pt was referred to nephrology by Dr. Gwenlyn Found 06/2015.  . Lumbar radiculopathy 2012  . Lumbar spinal stenosis 2019   facet injections only very short term relief.  L1 and L2  selective nerve root blocks helpful 04/2018.  . Lumbar spondylosis    MR 07/2016---no sign of spinal nerve compression or cord compression.  Pt set up with outpt ortho while admitted to hosp 07/2016.  . Malnourished ([place])   . Metatarsal fracture 06/10/2016   Nondisplaced, left 5th metatarsal--pt was referred to ortho  . Osteopenia 2014   T-score -2.1  . PAF (paroxysmal atrial fibrillation) ([facility])    Eliquis started after BRAO and CVA.  She was changed to warfarin 2018. All anticoag stopped due to recurrent falls.  . Presence of permanent cardiac pacemaker   . Right rib fracture 12/2016   s/p fall  . Sick sinus syndrome ([place]) 11/2016   Dual chamber pacer insertion 2018 (Dr. Lovena Le)  . Stroke ([place]) 0000000   cardioembolic (had CVA  while on no anticoag)--"scattered subacute punctate infarcts: 1 in R parietal lobe and 2 in occipital cortex" on MRI br.  CT angio head/neck: aortic arch athero.  R ICA 20% stenosis, L ICA w/out any stenosis.  . Thoracic back pain 01/2017; 01/2018   2018: Facet?  Dr. Ramos->steroid injection.  01/2018-->CT T spine to check for a new comp fx-->none found.  Selective nerve root inj in T spine helpful on R side.   . Thoracic compression fracture ([place]) 07/2016   T3.  T7 and T8-- T8 kyphoplasty during hosp admission 07/2016.  Neuro referred pt to pain mgmt for consideration of injection 09/2016.  I referred her to endo 08/2016 for consideration of calcitonin treatment.    Past Surgical History:  Procedure Laterality Date  . APPENDECTOMY  child  . AV FISTULA PLACEMENT Right 03/31/2017   Procedure: Right ARM Basilic vein transposition;  Surgeon: Conrad Landisville, MD;  Location: [facility] OR;  Service: Vascular;  Laterality: Right;  . AV FISTULA PLACEMENT Right 12/06/2017   Procedure: INSERTION OF ARTERIOVENOUS (AV) GORE-TEX GRAFT ARM RIGHT UPPER EXTREMITY;  Surgeon: [name], MD;  Location: [facility];  Service: Vascular;  Laterality: Right;  . CARDIOVASCULAR STRESS TEST  01/31/2016   Stress myoview: NORMAL/Low risk.  EF 56%.  . [place]  . COLONOSCOPY  2015   + hx of adenomatous polyps.  Need digest health spec in [place] records to see when pt due for next colonoscopy  . DECLOT CKV Right 03/21/2019  . EYE SURGERY Bilateral    Catarct  . INSERTION OF DIALYSIS CATHETER Right 01/29/2017   Procedure: INSERTION OF DIALYSIS CATHETER- RIGHT INTERNAL JUGULAR;  Surgeon: [name], MD;  Location: [facility];  Service: Vascular;  Laterality: Right;  . INTRAMEDULLARY (IM) NAIL INTERTROCHANTERIC Right 10/04/2018   Procedure: INTRAMEDULLARY (IM) NAIL INTERTROCHANTRIC;  Surgeon: Nicholes Stairs, MD;  Location: [facility];  Service: Orthopedics;  Laterality: Right;  . IR GENERIC HISTORICAL  07/24/2016   IR KYPHO  THORACIC WITH BONE BIOPSY 07/24/2016 Luanne Bras, MD [facility]  . KYPHOPLASTY  07/27/2016   T8  . PACEMAKER IMPLANT N/A 12/03/2016   Procedure: Pacemaker Implant;  Surgeon: Evans Lance, MD;  Location: [facility];  Service: Cardiovascular;  Laterality: N/A;  . Renal artery dopplers  02/26/2016   Her right renal dimension was 11 cm pole to pole with mild to moderate right renal artery stenosis. A right renal aortic ratio was 3.22 suggesting less than a 50% stenosis.  . Right upper arm AV Gore-Tex graft  12/06/2017   Dr. Donnetta Hutching  . TONSILLECTOMY    . TRANSTHORACIC ECHOCARDIOGRAM  01/29/2016; 11/2016; 12/2017   2017:  EF 55-60%, normal LV wall motion, grade I DD.  No cardiac source of emboli was seen.  2018: EF 35-40%, could not assess DD due to a-fib, pulm HTN noted. 12/2017 EF 55-60%, nl wall motion, grd II DD, mod MR and pulm regurg, increased pulm pressure (peak 52).    Outpatient Medications Prior to Visit  Medication Sig Dispense Refill  . amiodarone (PACERONE) 200 MG tablet TAKE 1 TABLET BY MOUTH  DAILY 90 tablet 1  . aspirin EC 325 MG tablet Take 325 mg by mouth every evening.    [name] b complex-vitamin c-folic acid (NEPHRO-VITE) 0.8 MG TABS tablet Take 1 tablet by mouth See admin instructions. Take one tablet by mouth on Tuesday, Thursday, Saturday - after dialysis    . calcitonin, salmon, (MIACALCIN/FORTICAL) 200 UNIT/ACT nasal spray     . DENTA 5000 PLUS 1.1 % CREA dental cream BRUSH ON MORNING AND NIGHT DO NOT RINSE    . docusate sodium (COLACE) 100 MG capsule Take 100 mg by mouth at bedtime.     [name] doxercalciferol (HECTOROL) 2.5 MCG capsule Take 3 mg by mouth as needed.    . heparin 1000 unit/mL SOLN injection Heparin Sodium (Porcine) 1,000 Units/mL Systemic    . isosorbide mononitrate (IMDUR) 60 MG 24 hr tablet Take 1 tablet (60 mg total) by mouth daily. Take one tablet (60 mg) by mouth on Sunday, Monday, Wednesday, Friday mornings, take one tablet (60 mg) on Tuesday,  Thursday, Saturday after dialysis 90 tablet 0  . lidocaine-prilocaine (EMLA) cream Apply 1 application topically See admin instructions. Apply small amount to access site (AVF) one hour before dialysis, cover with occlusive dressing (saran wrap)    . Methoxy PEG-Epoetin Beta (MIRCERA IJ) Mircera    . Multiple Vitamins-Minerals (MULTIVITAMIN WITH MINERALS) tablet Take 1 tablet by mouth at bedtime.     . pravastatin (PRAVACHOL) 40 MG tablet TAKE 1 TABLET BY MOUTH  EVERY EVENING (Patient taking differently: Take 40 mg by mouth at bedtime. ) 90 tablet 1  . sevelamer carbonate (RENVELA) 800 MG tablet Take 800-2,400 mg by mouth See admin instructions. Take two - three tablets (1600 mg - 2400 mg) by mouth up to three times daily with meals, take one tablet (800 mg) with snacks    . vitamin C (ASCORBIC ACID) 500 MG tablet Take 500 mg by mouth daily.    . Vitamin D, Ergocalciferol, (DRISDOL) 1.25 MG (50000 UT) CAPS capsule Take 1 capsule (50,000 Units total) by mouth every Thursday. 12 capsule 3  . ondansetron (ZOFRAN) 4 MG tablet Take 1 tablet (4 mg total) by mouth every 8 (eight) hours as needed for nausea or vomiting. 60 tablet 1  . oxyCODONE (OXY IR/ROXICODONE) 5 MG immediate release tablet 1-2 tabs po q6h prn pain 90 tablet 0  . acetaminophen (TYLENOL) 500 MG tablet Take 500-1,000 mg 3 (three) times daily as needed by mouth for moderate pain.     . carvedilol (COREG) 3.125 MG tablet TAKE 1 TABLET BY MOUTH TWO  TIMES DAILY (Patient not taking: No sig reported) 180 tablet 1  . diclofenac (FLECTOR) 1.3 % PTCH Flector 1.3 % transdermal 12 hour patch  Apply 1 patch twice a day by transdermal route.    . polyvinyl alcohol (LIQUIFILM TEARS) 1.4 % ophthalmic solution Place 1-2 drops into both eyes daily as needed for dry eyes.     . predniSONE (DELTASONE) 20 MG tablet Take 2 tablets (40 mg total) by mouth daily. (Patient not taking: Reported on 07/07/2019)  10 tablet 0  . warfarin (COUMADIN) 3 MG tablet Take by  mouth daily.     No facility-administered medications prior to visit.    Allergies  Allergen Reactions  . Clonidine Derivatives Palpitations and Other (See Comments)    VERY SEDATED  . Adhesive [Tape] Other (See Comments)    Tears skin up  . Codeine Nausea Only  . Nickel Rash  . Sulfa Antibiotics Other (See Comments)    Reaction unknown Did not feel well when taken Reaction unknown    ROS As per HPI  PE: Blood pressure 117/68, pulse 68, temperature 98 F (36.7 C), temperature source Temporal, resp. rate 16, height 4\' 11"  (1.499 m), SpO2 99 %. Gen: Alert, tired-appearing.  Patient is oriented to person, place, time, and situation. AFFECT: her pain has her in moderate distress She is TTP along L paraspinous region at upper/mid thoracic level LEGS: 3+ pitting both LL's, mainly ankles down into feet.   LABS:    Chemistry      Component Value Date/Time   NA 136 07/04/2019 1245   NA 138 06/23/2018 0000   K 4.9 07/04/2019 1245   CL 94 (L) 07/04/2019 1245   CO2 27 07/04/2019 1245   BUN 13 07/04/2019 1245   BUN 30 (A) 06/23/2018 0000   CREATININE 4.67 (H) 07/04/2019 1245   CREATININE 1.63 (H) 03/24/2016 0110   GLU 128 09/01/2016 0000      Component Value Date/Time   CALCIUM 8.5 (L) 07/04/2019 1245   CALCIUM 9.5 02/16/2017 0232   ALKPHOS 59 07/18/2018 2236   AST 17 07/18/2018 2236   ALT 9 07/18/2018 2236   BILITOT 0.5 07/18/2018 2236   BILITOT 0.2 06/17/2016 0819     Lab Results  Component Value Date   WBC 9.5 07/04/2019   HGB 11.4 (L) 07/04/2019   HCT 37.3 07/04/2019   MCV 98.9 07/04/2019   PLT 235 07/04/2019   Lab Results  Component Value Date   HGBA1C 5.2 12/15/2016   07/04/19 Thoracic spine x-rays: IMPRESSION: Very limited by osteopenia. Treated compression deformity at T8. CT demonstrated compression deformity at T3 is not well visualized radiographically. CT may be obtained if clinical suspicion for acute fracture remains high  IMPRESSION AND  PLAN:  1) Acute on chronic thoracic pain, hx of compression fractures. Recent x-rays during this pain did not show any acute compression fractures. MRI T spine ordered by Levy Pupa, ortho provider. She'll start the intranasal calcitonin recently rx'd by her as well. Given the severity of her pain I will start fentanyl patch 25 mcg q72h. Also, rx'd oxycodone 10mg , 1-2 q6h prn. We discussed risks/benefits of opioids.  Pt and her daughter expressed understanding today that pt is higher than avg risk for problems with opioids given her ESRD, age, and the variable absorption and respiratory depression effect of fentanyl. Will re-eval in 3 to 4 days to see how her pain is responding.  An After Visit Summary was printed and given to the patient.  FOLLOW UP: Return in about 3 days (around 07/10/2019) for f/u pain.  Signed:  Crissie Sickles, MD           07/07/2019

## 2019-07-08 DIAGNOSIS — E876 Hypokalemia: Secondary | ICD-10-CM | POA: Diagnosis not present

## 2019-07-08 DIAGNOSIS — D689 Coagulation defect, unspecified: Secondary | ICD-10-CM | POA: Diagnosis not present

## 2019-07-08 DIAGNOSIS — N2581 Secondary hyperparathyroidism of renal origin: Secondary | ICD-10-CM | POA: Diagnosis not present

## 2019-07-08 DIAGNOSIS — N186 End stage renal disease: Secondary | ICD-10-CM | POA: Diagnosis not present

## 2019-07-08 DIAGNOSIS — D509 Iron deficiency anemia, unspecified: Secondary | ICD-10-CM | POA: Diagnosis not present

## 2019-07-08 DIAGNOSIS — Z992 Dependence on renal dialysis: Secondary | ICD-10-CM | POA: Diagnosis not present

## 2019-07-08 DIAGNOSIS — D631 Anemia in chronic kidney disease: Secondary | ICD-10-CM | POA: Diagnosis not present

## 2019-07-11 ENCOUNTER — Telehealth: Payer: Self-pay | Admitting: Cardiovascular Disease

## 2019-07-11 DIAGNOSIS — D631 Anemia in chronic kidney disease: Secondary | ICD-10-CM | POA: Diagnosis not present

## 2019-07-11 DIAGNOSIS — Z992 Dependence on renal dialysis: Secondary | ICD-10-CM | POA: Diagnosis not present

## 2019-07-11 DIAGNOSIS — N186 End stage renal disease: Secondary | ICD-10-CM | POA: Diagnosis not present

## 2019-07-11 DIAGNOSIS — N2581 Secondary hyperparathyroidism of renal origin: Secondary | ICD-10-CM | POA: Diagnosis not present

## 2019-07-11 DIAGNOSIS — D689 Coagulation defect, unspecified: Secondary | ICD-10-CM | POA: Diagnosis not present

## 2019-07-11 DIAGNOSIS — D509 Iron deficiency anemia, unspecified: Secondary | ICD-10-CM | POA: Diagnosis not present

## 2019-07-11 DIAGNOSIS — E876 Hypokalemia: Secondary | ICD-10-CM | POA: Diagnosis not present

## 2019-07-11 NOTE — Telephone Encounter (Signed)
Patient's daughter called stating she would like to come with her mother to her appt on Friday 2/26, as her mother is in a wheelchair and requires assistance.  She is also her caretaker.

## 2019-07-11 NOTE — Telephone Encounter (Signed)
Called patients daughter- she advised that patient is not always able to explain what is going on with her, and she takes care of her- advised okay for her to come to appointment. Will send a message to primary nurse to make aware.

## 2019-07-12 ENCOUNTER — Encounter: Payer: Self-pay | Admitting: Family Medicine

## 2019-07-12 MED ORDER — ONDANSETRON HCL 4 MG PO TABS: 4.0000 mg | ORAL_TABLET | Freq: Three times a day (TID) | ORAL | 1 refills | Status: AC | PRN

## 2019-07-13 DIAGNOSIS — D509 Iron deficiency anemia, unspecified: Secondary | ICD-10-CM | POA: Diagnosis not present

## 2019-07-13 DIAGNOSIS — N186 End stage renal disease: Secondary | ICD-10-CM | POA: Diagnosis not present

## 2019-07-13 DIAGNOSIS — N2581 Secondary hyperparathyroidism of renal origin: Secondary | ICD-10-CM | POA: Diagnosis not present

## 2019-07-13 DIAGNOSIS — Z992 Dependence on renal dialysis: Secondary | ICD-10-CM | POA: Diagnosis not present

## 2019-07-13 DIAGNOSIS — D689 Coagulation defect, unspecified: Secondary | ICD-10-CM | POA: Diagnosis not present

## 2019-07-13 DIAGNOSIS — D631 Anemia in chronic kidney disease: Secondary | ICD-10-CM | POA: Diagnosis not present

## 2019-07-13 DIAGNOSIS — E876 Hypokalemia: Secondary | ICD-10-CM | POA: Diagnosis not present

## 2019-07-14 ENCOUNTER — Other Ambulatory Visit: Payer: Self-pay

## 2019-07-14 ENCOUNTER — Encounter: Payer: Self-pay | Admitting: Cardiovascular Disease

## 2019-07-14 ENCOUNTER — Ambulatory Visit: Payer: Medicare Other | Admitting: Cardiovascular Disease

## 2019-07-14 DIAGNOSIS — I701 Atherosclerosis of renal artery: Secondary | ICD-10-CM | POA: Diagnosis not present

## 2019-07-14 DIAGNOSIS — E785 Hyperlipidemia, unspecified: Secondary | ICD-10-CM | POA: Diagnosis not present

## 2019-07-14 DIAGNOSIS — I428 Other cardiomyopathies: Secondary | ICD-10-CM

## 2019-07-14 DIAGNOSIS — I10 Essential (primary) hypertension: Secondary | ICD-10-CM

## 2019-07-14 DIAGNOSIS — I480 Paroxysmal atrial fibrillation: Secondary | ICD-10-CM

## 2019-07-14 DIAGNOSIS — E782 Mixed hyperlipidemia: Secondary | ICD-10-CM

## 2019-07-14 DIAGNOSIS — Z950 Presence of cardiac pacemaker: Secondary | ICD-10-CM

## 2019-07-14 NOTE — Assessment & Plan Note (Signed)
History of permanent transvenous pacemaker implanted by Dr. Lovena Le 12/03/2016 for tacky/bradycardia syndrome which she follows as an outpatient.

## 2019-07-14 NOTE — Assessment & Plan Note (Signed)
History of PAF maintaining sinus rhythm.  She was on Coumadin in the past which was discontinued because of bleeding and fall risk.  She is on amiodarone.

## 2019-07-14 NOTE — Patient Instructions (Signed)
Medication Instructions:  Your physician recommends that you continue on your current medications as directed. Please refer to the Current Medication list given to you today.  If you need a refill on your cardiac medications before your next appointment, please call your pharmacy.   Lab work: NONE  Testing/Procedures: NONE  Follow-Up: At [facility], you and your health needs are our priority.  As part of our continuing mission to provide you with exceptional heart care, we have created designated Provider Care Teams.  These Care Teams include your primary Cardiologist (physician) and Advanced Practice Providers (APPs -  Physician Assistants and Nurse Practitioners) who all work together to provide you with the care you need, when you need it. You may see Jonathan Berry, MD or one of the following Advanced Practice Providers on your designated Care Team:    Luke Kilroy, PA-C  Callie Goodrich, PA-C  Jesse Cleaver, FNP  Your physician wants you to follow-up in: 1 year with Dr. Berry You will receive a reminder letter in the mail two months in advance. If you don't receive a letter, please call our office to schedule the follow-up appointment.       

## 2019-07-14 NOTE — Assessment & Plan Note (Signed)
History of essential hypertension blood pressure measured today 146/65.  She is on carvedilol.  She measures her blood pressures at home which are usually much better than this.  She holds her antihypertensive medications on the day of dialysis because of hypotension.

## 2019-07-14 NOTE — Progress Notes (Signed)
07/14/2019 Valerie Terrell   [birth year]  [id number]  Primary Physician McGowen, Adrian Blackwater, MD Primary Cardiologist: Lorretta Harp MD [name], [place]  HPI:  Valerie Terrell is a 86 y.o.  widowed Caucasian female mother of one daughter Valerie Terrell accompanies her today andwho I last saw in the office  12/22/2017. 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 She smoked remotely and drinks socially. She does have a history of difficult to control hypertension. Hyperlipidemia. She had a 2-D echo which was normal, a Myoview stress test which was normal and a CT angiogram performed 01/29/16 that showed no evidence of osseous stenosis. She does have difficult to control hypertension with a recent renal Doppler study performed 02/26/16 that showed an atrophic nonfunctioning left kidney and a right kidney of measuring 11 cm with at least a moderate right renal artery stenosis. Her serum creatinine in September was 1.15 and currently isn't elicited is in the 1.6 range potentially related to hay her ARB. Her ARB was stopped her serum creatinine fell to 1.4. She saw Dr. Erling Cruz, nephrology, who increased her hydralazine and clonidine. She was intolerant to her higher hydralazine dose and seems to be intolerant to the clonidine as well. She had difficult to control hypertension, progressive renal insufficiency and bilateral lower extremity edema on high-dose diuretics. She was hospitalized in July for combined systolic and diastolic heart failure and had a permanent transvenous pacemaker placed by Dr. Lovena Le for tachybradycardia syndrome. She was admitted again in September and began dialysis. Her EF in July was 35-40% which is reduced compared to the EF in September 60 to 65%.Her Eliquiswas changed to Coumadin. She is currently being dialyzed 3 times a  week. Her edema has improved as has her blood pressure control and her breathing.   She did have a mechanical fall  laceration of her scalp and bleeding issues related to Coumadin. This was not syncope. She had a fistula placed by Dr. Donnetta Hutching in late July with some subsequent ecchymosis and hematoma. She was admitted to [facility] on 7/30 and discharged 2 days later because of volume overload and respiratory distress.  She says this was related to dietary indiscretion with regards to salt (French fries at [employer]).  She was dialyzed and feels clinically improved.  Her weight is down 4 pounds.  Interestingly, a 2D echo performed 12/15/2017 revealed EF of 50 to 55%, markedly improved from her prior echo of 35%.  She is on Coumadin for PAF with an INR measured at 1.9.  She did see Dr. Lovena Le in the office on 12/10/2017 which time her pacemaker was functioning well.  Since I saw her a year and a half ago she continues to do well.  Her blood pressure is under good control which she measures at home.  She does adjust her antihypertensive medications based on her dialysis days.  She does feel weak.  She denies chest pain.  She was recently seen in the ER because of back pain and does have chronic back related issues.  She was on Coumadin anticoagulation which was discontinued because of bleeding and fall risk.  She is maintaining sinus rhythm on amiodarone.   Current Meds  Medication Sig  . acetaminophen (TYLENOL) 500 MG tablet Take 500-1,000 mg 3 (three) times daily as needed by mouth for moderate  pain.   . amiodarone (PACERONE) 200 MG tablet TAKE 1 TABLET BY MOUTH  DAILY  . aspirin EC 325 MG tablet Take 325 mg by mouth every evening.  [name] b complex-vitamin c-folic acid (NEPHRO-VITE) 0.8 MG TABS tablet Take 1 tablet by mouth See admin instructions. Take one tablet by mouth on Tuesday, Thursday, Saturday - after dialysis  . calcitonin, salmon, (MIACALCIN/FORTICAL) 200 UNIT/ACT nasal spray     . carvedilol (COREG) 3.125 MG tablet TAKE 1 TABLET BY MOUTH TWO  TIMES DAILY  . DENTA 5000 PLUS 1.1 % CREA dental cream BRUSH ON MORNING AND NIGHT DO NOT RINSE  . diclofenac (FLECTOR) 1.3 % PTCH Flector 1.3 % transdermal 12 hour patch  Apply 1 patch twice a day by transdermal route.  . docusate sodium (COLACE) 100 MG capsule Take 100 mg by mouth at bedtime.   [name] doxercalciferol (HECTOROL) 2.5 MCG capsule Take 3 mg by mouth as needed.  . fentaNYL (DURAGESIC) 25 MCG/HR Place 1 patch onto the skin every 3 (three) days.  . heparin 1000 unit/mL SOLN injection Heparin Sodium (Porcine) 1,000 Units/mL Systemic  . isosorbide mononitrate (IMDUR) 60 MG 24 hr tablet Take 1 tablet (60 mg total) by mouth daily. Take one tablet (60 mg) by mouth on Sunday, Monday, Wednesday, Friday mornings, take one tablet (60 mg) on Tuesday, Thursday, Saturday after dialysis  . lidocaine-prilocaine (EMLA) cream Apply 1 application topically See admin instructions. Apply small amount to access site (AVF) one hour before dialysis, cover with occlusive dressing (saran wrap)  . Methoxy PEG-Epoetin Beta (MIRCERA IJ) Mircera  . Multiple Vitamins-Minerals (MULTIVITAMIN WITH MINERALS) tablet Take 1 tablet by mouth at bedtime.   . ondansetron (ZOFRAN) 4 MG tablet Take 1 tablet (4 mg total) by mouth every 8 (eight) hours as needed for nausea or vomiting.  . Oxycodone HCl 10 MG TABS 1-2 tabs po q6h prn severe pain  . polyvinyl alcohol (LIQUIFILM TEARS) 1.4 % ophthalmic solution Place 1-2 drops into both eyes daily as needed for dry eyes.   . pravastatin (PRAVACHOL) 40 MG tablet TAKE 1 TABLET BY MOUTH  EVERY EVENING (Patient taking differently: Take 40 mg by mouth at bedtime. )  . sevelamer carbonate (RENVELA) 800 MG tablet Take 800-2,400 mg by mouth See admin instructions. Take two - three tablets (1600 mg - 2400 mg) by mouth up to three times daily with meals, take one tablet (800 mg) with snacks  . vitamin C (ASCORBIC ACID) 500 MG tablet  Take 500 mg by mouth daily.  . Vitamin D, Ergocalciferol, (DRISDOL) 1.25 MG (50000 UT) CAPS capsule Take 1 capsule (50,000 Units total) by mouth every Thursday.     Allergies  Allergen Reactions  . Clonidine Derivatives Palpitations and Other (See Comments)    VERY SEDATED  . Adhesive [Tape] Other (See Comments)    Tears skin up  . Codeine Nausea Only  . Nickel Rash  . Sulfa Antibiotics Other (See Comments)    Reaction unknown Did not feel well when taken Reaction unknown    Social History   Socioeconomic History  . Marital status: Widowed    Spouse name: Not on file  . Number of children: Not on file  . Years of education: Not on file  . Highest education level: Not on file  Occupational History  . Not on file  Tobacco Use  . Smoking status: Former Smoker    Years: 22.00    Quit date: 03/17/1972    Years since quitting: 47.3  .  Smokeless tobacco: Never Used  Substance and Sexual Activity  . Alcohol use: Yes    Alcohol/week: 1.0 standard drinks    Types: 1 Glasses of wine per week    Comment: wine  . Drug use: No  . Sexual activity: Not Currently  Other Topics Concern  . Not on file  Social History Narrative   Widow.  One daughter, lives with her.   Educ: college   Occup: retired Marine scientist.   No T/A/Ds.   She is almost a vegetarian.   Social Determinants of Health   Financial Resource Strain:   . Difficulty of Paying Living Expenses: Not on file  Food Insecurity:   . Worried About Charity fundraiser in the Last Year: Not on file  . Ran Out of Food in the Last Year: Not on file  Transportation Needs:   . Lack of Transportation (Medical): Not on file  . Lack of Transportation (Non-Medical): Not on file  Physical Activity:   . Days of Exercise per Week: Not on file  . Minutes of Exercise per Session: Not on file  Stress:   . Feeling of Stress : Not on file  Social Connections:   . Frequency of Communication with Friends and Family: Not on file  . Frequency  of Social Gatherings with Friends and Family: Not on file  . Attends Religious Services: Not on file  . Active Member of Clubs or Organizations: Not on file  . Attends Archivist Meetings: Not on file  . Marital Status: Not on file  Intimate Partner Violence:   . Fear of Current or Ex-Partner: Not on file  . Emotionally Abused: Not on file  . Physically Abused: Not on file  . Sexually Abused: Not on file     Review of Systems: General: negative for chills, fever, night sweats or weight changes.  Cardiovascular: negative for chest pain, dyspnea on exertion, edema, orthopnea, palpitations, paroxysmal nocturnal dyspnea or shortness of breath Dermatological: negative for rash Respiratory: negative for cough or wheezing Urologic: negative for hematuria Abdominal: negative for nausea, vomiting, diarrhea, bright red blood per rectum, melena, or hematemesis Neurologic: negative for visual changes, syncope, or dizziness All other systems reviewed and are otherwise negative except as noted above.    Blood pressure (!) 146/65, pulse 84, height 4\' 11"  (1.499 m), SpO2 95 %.  General appearance: alert and no distress Neck: no adenopathy, no carotid bruit, no JVD, supple, symmetrical, trachea midline and thyroid not enlarged, symmetric, no tenderness/mass/nodules Lungs: clear to auscultation bilaterally Heart: Apical systolic murmur consistent with MR Extremities: 2+ right lower extremity edema Pulses: 2+ and symmetric Skin: Skin color, texture, turgor normal. No rashes or lesions Neurologic: Alert and oriented X 3, normal strength and tone. Normal symmetric reflexes. Normal coordination and gait  EKG not performed today  ASSESSMENT AND PLAN:   PAF (paroxysmal atrial fibrillation) (HCC) History of PAF maintaining sinus rhythm.  She was on Coumadin in the past which was discontinued because of bleeding and fall risk.  She is on amiodarone.  Essential hypertension History of  essential hypertension blood pressure measured today 146/65.  She is on carvedilol.  She measures her blood pressures at home which are usually much better than this.  She holds her antihypertensive medications on the day of dialysis because of hypotension.  Dyslipidemia History of dyslipidemia on statin therapy with lipid profile performed 03/28/2018 revealing a total cholesterol of 194, LDL of 98 and HDL 62.  Renal artery stenosis ([place])  History of renal artery stenosis with a known atrophic nonfunctioning left kidney and the right kidney that had measured 11 cm in the past by Doppler with moderate right renal artery stenosis.  She currently is on renal replacement therapy/hemodialysis 3 days a week.  Hyperlipidemia History of hyperlipidemia on statin therapy with lipid profile performed 03/28/2018 revealing cholesterol 94, LDL 98 and HDL 62.  Cardiac pacemaker History of permanent transvenous pacemaker implanted by Dr. Lovena Le 12/03/2016 for tacky/bradycardia syndrome which she follows as an outpatient.  Non-ischemic cardiomyopathy ([place]) History of nonischemic cardiomyopathy with an EF that improved from 35% up to 55% by her last echo July 2019.  She does have mild to moderate MR.      Lorretta Harp MD FACP,FACC,FAHA, [facility] 07/14/2019 2:10 PM

## 2019-07-14 NOTE — Assessment & Plan Note (Signed)
History of hyperlipidemia on statin therapy with lipid profile performed 03/28/2018 revealing cholesterol 94, LDL 98 and HDL 62.

## 2019-07-14 NOTE — Assessment & Plan Note (Signed)
History of nonischemic cardiomyopathy with an EF that improved from 35% up to 55% by her last echo July 2019.  She does have mild to moderate MR.

## 2019-07-14 NOTE — Assessment & Plan Note (Signed)
History of renal artery stenosis with a known atrophic nonfunctioning left kidney and the right kidney that had measured 11 cm in the past by Doppler with moderate right renal artery stenosis.  She currently is on renal replacement therapy/hemodialysis 3 days a week.

## 2019-07-14 NOTE — Assessment & Plan Note (Signed)
History of dyslipidemia on statin therapy with lipid profile performed 03/28/2018 revealing a total cholesterol of 194, LDL of 98 and HDL 62.

## 2019-07-15 DIAGNOSIS — N2581 Secondary hyperparathyroidism of renal origin: Secondary | ICD-10-CM | POA: Diagnosis not present

## 2019-07-15 DIAGNOSIS — D689 Coagulation defect, unspecified: Secondary | ICD-10-CM | POA: Diagnosis not present

## 2019-07-15 DIAGNOSIS — Z992 Dependence on renal dialysis: Secondary | ICD-10-CM | POA: Diagnosis not present

## 2019-07-15 DIAGNOSIS — D509 Iron deficiency anemia, unspecified: Secondary | ICD-10-CM | POA: Diagnosis not present

## 2019-07-15 DIAGNOSIS — D631 Anemia in chronic kidney disease: Secondary | ICD-10-CM | POA: Diagnosis not present

## 2019-07-15 DIAGNOSIS — E876 Hypokalemia: Secondary | ICD-10-CM | POA: Diagnosis not present

## 2019-07-15 DIAGNOSIS — N186 End stage renal disease: Secondary | ICD-10-CM | POA: Diagnosis not present

## 2019-07-16 DIAGNOSIS — N186 End stage renal disease: Secondary | ICD-10-CM | POA: Diagnosis not present

## 2019-07-16 DIAGNOSIS — I120 Hypertensive chronic kidney disease with stage 5 chronic kidney disease or end stage renal disease: Secondary | ICD-10-CM | POA: Diagnosis not present

## 2019-07-16 DIAGNOSIS — Z992 Dependence on renal dialysis: Secondary | ICD-10-CM | POA: Diagnosis not present

## 2019-07-18 ENCOUNTER — Encounter: Payer: Self-pay | Admitting: Family Medicine

## 2019-07-18 DIAGNOSIS — D509 Iron deficiency anemia, unspecified: Secondary | ICD-10-CM | POA: Diagnosis not present

## 2019-07-18 DIAGNOSIS — D689 Coagulation defect, unspecified: Secondary | ICD-10-CM | POA: Diagnosis not present

## 2019-07-18 DIAGNOSIS — E8770 Fluid overload, unspecified: Secondary | ICD-10-CM | POA: Diagnosis not present

## 2019-07-18 DIAGNOSIS — R52 Pain, unspecified: Secondary | ICD-10-CM | POA: Diagnosis not present

## 2019-07-18 DIAGNOSIS — N2581 Secondary hyperparathyroidism of renal origin: Secondary | ICD-10-CM | POA: Diagnosis not present

## 2019-07-18 DIAGNOSIS — E876 Hypokalemia: Secondary | ICD-10-CM | POA: Diagnosis not present

## 2019-07-18 DIAGNOSIS — Z992 Dependence on renal dialysis: Secondary | ICD-10-CM | POA: Diagnosis not present

## 2019-07-18 DIAGNOSIS — D631 Anemia in chronic kidney disease: Secondary | ICD-10-CM | POA: Diagnosis not present

## 2019-07-18 DIAGNOSIS — N186 End stage renal disease: Secondary | ICD-10-CM | POA: Diagnosis not present

## 2019-07-18 LAB — POCT INR: INR: 0.9 (ref 0.9–1.1)

## 2019-07-20 ENCOUNTER — Other Ambulatory Visit: Payer: Self-pay

## 2019-07-20 ENCOUNTER — Ambulatory Visit (INDEPENDENT_AMBULATORY_CARE_PROVIDER_SITE_OTHER): Payer: Medicare Other | Admitting: Family Medicine

## 2019-07-20 ENCOUNTER — Encounter: Payer: Self-pay | Admitting: Family Medicine

## 2019-07-20 VITALS — BP 122/92

## 2019-07-20 DIAGNOSIS — N2581 Secondary hyperparathyroidism of renal origin: Secondary | ICD-10-CM | POA: Diagnosis not present

## 2019-07-20 DIAGNOSIS — D509 Iron deficiency anemia, unspecified: Secondary | ICD-10-CM | POA: Diagnosis not present

## 2019-07-20 DIAGNOSIS — G8929 Other chronic pain: Secondary | ICD-10-CM

## 2019-07-20 DIAGNOSIS — D631 Anemia in chronic kidney disease: Secondary | ICD-10-CM | POA: Diagnosis not present

## 2019-07-20 DIAGNOSIS — D689 Coagulation defect, unspecified: Secondary | ICD-10-CM | POA: Diagnosis not present

## 2019-07-20 DIAGNOSIS — M546 Pain in thoracic spine: Secondary | ICD-10-CM | POA: Diagnosis not present

## 2019-07-20 DIAGNOSIS — Z8781 Personal history of (healed) traumatic fracture: Secondary | ICD-10-CM | POA: Diagnosis not present

## 2019-07-20 DIAGNOSIS — E876 Hypokalemia: Secondary | ICD-10-CM | POA: Diagnosis not present

## 2019-07-20 DIAGNOSIS — R52 Pain, unspecified: Secondary | ICD-10-CM | POA: Diagnosis not present

## 2019-07-20 DIAGNOSIS — N186 End stage renal disease: Secondary | ICD-10-CM | POA: Diagnosis not present

## 2019-07-20 DIAGNOSIS — E8770 Fluid overload, unspecified: Secondary | ICD-10-CM | POA: Diagnosis not present

## 2019-07-20 DIAGNOSIS — Z992 Dependence on renal dialysis: Secondary | ICD-10-CM | POA: Diagnosis not present

## 2019-07-20 MED ORDER — FENTANYL 50 MCG/HR TD PT72: 1.0000 | MEDICATED_PATCH | TRANSDERMAL | 0 refills | Status: DC

## 2019-07-20 MED ORDER — NALOXONE HCL 0.4 MG/ML IJ SOLN: INTRAMUSCULAR | 1 refills | Status: DC

## 2019-07-20 NOTE — Progress Notes (Signed)
Virtual Visit via Video Note  I connected with pt on 07/20/19 at 10:30 AM EST by a video enabled telemedicine application and verified that I am speaking with the correct person using two identifiers.  Location patient: home Location provider:work or home office Persons participating in the virtual visit: patient, daughter Bryson Ha, provider  I discussed the limitations of evaluation and management by telemedicine and the availability of in person appointments. The patient expressed understanding and agreed to proceed.  Telemedicine visit is a necessity given the COVID-19 restrictions in place at the current time.  HPI: 86 y/o WF being seen today accompanied by her daughter/primary caregiver Bryson Ha for 2 wk f/u severe chronic pain, acute on chronic last visit, question of new injury from a fall but pt stated she never felt like she injured herself b/c the falls were very low impact. A/P as of that visit: "1) Acute on chronic thoracic pain, hx of compression fractures. Recent x-rays during this pain did not show any acute compression fractures. MRI T spine ordered by Levy Pupa, ortho provider. She'll start the intranasal calcitonin recently rx'd by her as well. Given the severity of her pain I will start fentanyl patch 25 mcg q72h. Also, rx'd oxycodone 10mg , 1-2 q6h prn. We discussed risks/benefits of opioids.  Pt and her daughter expressed understanding today that pt is higher than avg risk for problems with opioids given her ESRD, age, and the variable absorption and respiratory depression effect of fentanyl. Will re-eval in 3 to 4 days to see how her pain is responding."  Interim hx:  MRI of T spine showing up as scheduled -->07/24/19. Says duragesic no help.  Using 1-2 tabs at a time, occ 3.  Using it helps make it tolerable, lasts 3 hrs or so. No side effects from fentanyl, no excessive sedation.  No nausea.  Appetite unchanged.  Ran out of patch today. Has been going to dialysis as  schedule, also has extra session tomorrow. Has started calcitonin since I last saw her.     ROS: See pertinent positives and negatives per HPI.  Past Medical History:  Diagnosis Date  . Anemia of chronic disease 2018   + anemia of CRI?  [name] Atrophy of left kidney    with absent blood flow by renal artery dopplers (Dr. Gwenlyn Found)  . 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 Chronic combined systolic and diastolic CHF (congestive heart failure) ([place])    a. 11/2016: echo showing EF of 35-40%, RV strain noted, mild MR and mild TR.   Echo showed much improved EF 12/2017 (55-60%)  . Chronic respiratory failure with hypoxia ([place]) 02/11/2017   Now on chronic O2 (intermittent as of summer 2019).    . Closed fracture of right superior pubic ramus ([place]) 07/18/2018   Non op mgmt (Dr. Victorino December was ortho who saw her in hosp but i don't think pt followed up with him).  . Closed right hip fracture ([place]) 10/04/2018   ORIF  . Debilitated patient    WC dependent as of 2018.  Hosp bed + full assistance with most ADLs s/p hip fx surg 09/2018.  [name] ESRD on hemodialysis ([place]) 01/2017   T/Th/Sat schedule- [place].  Right basilic AV fistula [id number].  Graft 2019.  [name] History of adenomatous polyp of colon   . History of kidney stones   . History of subarachnoid hemorrhage 10/2014   after syncope and while on xarelto  . Hyperlipidemia   . Hypertension  Difficult to control, in the setting of one functioning kidney: pt was referred to nephrology by Dr. Gwenlyn Found 06/2015.  . Lumbar radiculopathy 2012  . Lumbar spinal stenosis 2019   facet injections only very short term relief.  L1 and L2 selective nerve root blocks helpful 04/2018.  . Lumbar spondylosis    MR 07/2016---no sign of spinal nerve compression or cord compression.  Pt set up with outpt ortho while admitted to hosp 07/2016.  . Malnourished ([place])   . Metatarsal fracture 06/10/2016   Nondisplaced, left 5th metatarsal--pt was referred to  ortho  . Osteopenia 2014   T-score -2.1  . PAF (paroxysmal atrial fibrillation) ([facility])    Eliquis started after BRAO and CVA.  She was changed to warfarin 2018. All anticoag stopped due to recurrent falls.  . Presence of permanent cardiac pacemaker   . Right rib fracture 12/2016   s/p fall  . Sick sinus syndrome ([place]) 11/2016   Dual chamber pacer insertion 2018 (Dr. Lovena Le)  . Stroke ([place]) 0000000   cardioembolic (had CVA while on no anticoag)--"scattered subacute punctate infarcts: 1 in R parietal lobe and 2 in occipital cortex" on MRI br.  CT angio head/neck: aortic arch athero.  R ICA 20% stenosis, L ICA w/out any stenosis.  . Thoracic back pain 01/2017; 01/2018   2018: Facet?  Dr. Ramos->steroid injection.  01/2018-->CT T spine to check for a new comp fx-->none found.  Selective nerve root inj in T spine helpful on R side.   . Thoracic compression fracture ([place]) 07/2016   T3.  T7 and T8-- T8 kyphoplasty during hosp admission 07/2016.  Neuro referred pt to pain mgmt for consideration of injection 09/2016.  I referred her to endo 08/2016 for consideration of calcitonin treatment.    Past Surgical History:  Procedure Laterality Date  . APPENDECTOMY  child  . AV FISTULA PLACEMENT Right 03/31/2017   Procedure: Right ARM Basilic vein transposition;  Surgeon: Conrad Salina, MD;  [facility] OR;  Service: Vascular;  Laterality: Right;  . AV FISTULA PLACEMENT Right 12/06/2017   Procedure: INSERTION OF ARTERIOVENOUS (AV) GORE-TEX GRAFT ARM RIGHT UPPER EXTREMITY;  Surgeon: [name], MD;  Location: [facility];  Service: Vascular;  Laterality: Right;  . CARDIOVASCULAR STRESS TEST  01/31/2016   Stress myoview: NORMAL/Low risk.  EF 56%.  . [place]  . COLONOSCOPY  2015   + hx of adenomatous polyps.  Need digest health spec in [facility] records to see when pt due for next colonoscopy  . DECLOT CKV Right 03/21/2019  . EYE SURGERY Bilateral    Catarct  . INSERTION OF DIALYSIS CATHETER Right  01/29/2017   Procedure: INSERTION OF DIALYSIS CATHETER- RIGHT INTERNAL JUGULAR;  Surgeon: [name], MD;  Location: [facility];  Service: Vascular;  Laterality: Right;  . INTRAMEDULLARY (IM) NAIL INTERTROCHANTERIC Right 10/04/2018   Procedure: INTRAMEDULLARY (IM) NAIL INTERTROCHANTRIC;  Surgeon: Nicholes Stairs, MD;  Location: [facility];  Service: Orthopedics;  Laterality: Right;  . IR GENERIC HISTORICAL  07/24/2016   IR KYPHO THORACIC WITH BONE BIOPSY 07/24/2016 Luanne Bras, MD [facility]  . KYPHOPLASTY  07/27/2016   T8  . PACEMAKER IMPLANT N/A 12/03/2016   Procedure: Pacemaker Implant;  Surgeon: Evans Lance, MD;  Location: [facility];  Service: Cardiovascular;  Laterality: N/A;  . Renal artery dopplers  02/26/2016   Her right renal dimension was 11 cm pole to pole with mild to moderate right renal artery stenosis.  A right renal aortic ratio was 3.22 suggesting less than a 50% stenosis.  . Right upper arm AV Gore-Tex graft  12/06/2017   Dr. Donnetta Hutching  . TONSILLECTOMY    . TRANSTHORACIC ECHOCARDIOGRAM  01/29/2016; 11/2016; 12/2017   2017: EF 55-60%, normal LV wall motion, grade I DD.  No cardiac source of emboli was seen.  2018: EF 35-40%, could not assess DD due to a-fib, pulm HTN noted. 12/2017 EF 55-60%, nl wall motion, grd II DD, mod MR and pulm regurg, increased pulm pressure (peak 52).    Family History  Problem Relation Age of Onset  . Cancer Mother   . Heart disease Father   . Early death Father   . Sudden Cardiac Death Neg Hx      Current Outpatient Medications:  .  acetaminophen (TYLENOL) 500 MG tablet, Take 500-1,000 mg 3 (three) times daily as needed by mouth for moderate pain. , Disp: , Rfl:  .  amiodarone (PACERONE) 200 MG tablet, TAKE 1 TABLET BY MOUTH  DAILY, Disp: 90 tablet, Rfl: 1 .  aspirin EC 325 MG tablet, Take 325 mg by mouth every evening., Disp: , Rfl:  .  b complex-vitamin c-folic acid (NEPHRO-VITE) 0.8 MG TABS tablet, Take 1 tablet by mouth See admin  instructions. Take one tablet by mouth on Tuesday, Thursday, Saturday - after dialysis, Disp: , Rfl:  .  calcitonin, salmon, (MIACALCIN/FORTICAL) 200 UNIT/ACT nasal spray, , Disp: , Rfl:  .  carvedilol (COREG) 3.125 MG tablet, TAKE 1 TABLET BY MOUTH TWO  TIMES DAILY, Disp: 180 tablet, Rfl: 1 .  DENTA 5000 PLUS 1.1 % CREA dental cream, BRUSH ON MORNING AND NIGHT DO NOT RINSE, Disp: , Rfl:  .  diclofenac (FLECTOR) 1.3 % PTCH, Flector 1.3 % transdermal 12 hour patch  Apply 1 patch twice a day by transdermal route., Disp: , Rfl:  .  docusate sodium (COLACE) 100 MG capsule, Take 100 mg by mouth at bedtime. , Disp: , Rfl:  .  doxercalciferol (HECTOROL) 2.5 MCG capsule, Take 3 mg by mouth as needed., Disp: , Rfl:  .  fentaNYL (DURAGESIC) 25 MCG/HR, Place 1 patch onto the skin every 3 (three) days., Disp: 5 patch, Rfl: 0 .  heparin 1000 unit/mL SOLN injection, Heparin Sodium (Porcine) 1,000 Units/mL Systemic, Disp: , Rfl:  .  isosorbide mononitrate (IMDUR) 60 MG 24 hr tablet, Take 1 tablet (60 mg total) by mouth daily. Take one tablet (60 mg) by mouth on Sunday, Monday, Wednesday, Friday mornings, take one tablet (60 mg) on Tuesday, Thursday, Saturday after dialysis, Disp: 90 tablet, Rfl: 0 .  lidocaine-prilocaine (EMLA) cream, Apply 1 application topically See admin instructions. Apply small amount to access site (AVF) one hour before dialysis, cover with occlusive dressing (saran wrap), Disp: , Rfl:  .  Methoxy PEG-Epoetin Beta (MIRCERA IJ), Mircera, Disp: , Rfl:  .  Multiple Vitamins-Minerals (MULTIVITAMIN WITH MINERALS) tablet, Take 1 tablet by mouth at bedtime. , Disp: , Rfl:  .  ondansetron (ZOFRAN) 4 MG tablet, Take 1 tablet (4 mg total) by mouth every 8 (eight) hours as needed for nausea or vomiting., Disp: 60 tablet, Rfl: 1 .  Oxycodone HCl 10 MG TABS, 1-2 tabs po q6h prn severe pain, Disp: 90 tablet, Rfl: 0 .  polyvinyl alcohol (LIQUIFILM TEARS) 1.4 % ophthalmic solution, Place 1-2 drops into both  eyes daily as needed for dry eyes. , Disp: , Rfl:  .  pravastatin (PRAVACHOL) 40 MG tablet, TAKE 1 TABLET BY MOUTH  EVERY EVENING (Patient taking differently: Take 40 mg by mouth at bedtime. ), Disp: 90 tablet, Rfl: 1 .  sevelamer carbonate (RENVELA) 800 MG tablet, Take 800-2,400 mg by mouth See admin instructions. Take two - three tablets (1600 mg - 2400 mg) by mouth up to three times daily with meals, take one tablet (800 mg) with snacks, Disp: , Rfl:  .  vitamin C (ASCORBIC ACID) 500 MG tablet, Take 500 mg by mouth daily., Disp: , Rfl:  .  Vitamin D, Ergocalciferol, (DRISDOL) 1.25 MG (50000 UT) CAPS capsule, Take 1 capsule (50,000 Units total) by mouth every Thursday., Disp: 12 capsule, Rfl: 3  EXAM:  VITALS per patient if applicable: LMP  (LMP Unknown)    GENERAL: alert, oriented, appears well and in no acute distress  HEENT: atraumatic, conjunttiva clear, no obvious abnormalities on inspection of external nose and ears  NECK: normal movements of the head and neck  LUNGS: on inspection no signs of respiratory distress, breathing rate appears normal, no obvious gross SOB, gasping or wheezing  CV: no obvious cyanosis  MS: moves all visible extremities without noticeable abnormality  PSYCH/NEURO: pleasant and cooperative, no obvious depression or anxiety, speech and thought processing grossly intact  LABS: none today  ASSESSMENT AND PLAN:  Discussed the following assessment and plan:  Acute-on-chronic sever low back pain, question of new thoracic vertebral compression fracture. Awaiting MRI to be done in 5 d. Fentanyl 25 mcg had no effect in good way or bad way. Will increase to 50 mcg patch q72h, continue cautious use of oxycodone for breakthrough pain. Again we discussed the dangers of opioids regarding resp depression, oversedation possibly leading to falls, constipation, urinary retention, allergic rxn. I discussed use of narcan prn to reverse opioid effect if needed, sent rx  for narcan to pharmacy today.  Daughter and pt expressed understanding. Continue calcitonin at this time as well.    I discussed the assessment and treatment plan with the patient. The patient was provided an opportunity to ask questions and all were answered. The patient agreed with the plan and demonstrated an understanding of the instructions.   The patient was advised to call back or seek an in-person evaluation if the symptoms worsen or if the condition fails to improve as anticipated.  F/u: 2 wks, telemed, f/u pain  Signed:  Crissie Sickles, MD           07/20/2019

## 2019-07-21 ENCOUNTER — Encounter: Payer: Self-pay | Admitting: Family Medicine

## 2019-07-21 ENCOUNTER — Other Ambulatory Visit: Payer: Self-pay | Admitting: Family Medicine

## 2019-07-21 DIAGNOSIS — N186 End stage renal disease: Secondary | ICD-10-CM | POA: Diagnosis not present

## 2019-07-21 DIAGNOSIS — R52 Pain, unspecified: Secondary | ICD-10-CM | POA: Diagnosis not present

## 2019-07-21 DIAGNOSIS — D631 Anemia in chronic kidney disease: Secondary | ICD-10-CM | POA: Diagnosis not present

## 2019-07-21 DIAGNOSIS — D689 Coagulation defect, unspecified: Secondary | ICD-10-CM | POA: Diagnosis not present

## 2019-07-21 DIAGNOSIS — Z992 Dependence on renal dialysis: Secondary | ICD-10-CM | POA: Diagnosis not present

## 2019-07-21 DIAGNOSIS — N2581 Secondary hyperparathyroidism of renal origin: Secondary | ICD-10-CM | POA: Diagnosis not present

## 2019-07-21 DIAGNOSIS — D509 Iron deficiency anemia, unspecified: Secondary | ICD-10-CM | POA: Diagnosis not present

## 2019-07-21 DIAGNOSIS — E8770 Fluid overload, unspecified: Secondary | ICD-10-CM | POA: Diagnosis not present

## 2019-07-21 DIAGNOSIS — E876 Hypokalemia: Secondary | ICD-10-CM | POA: Diagnosis not present

## 2019-07-21 NOTE — Telephone Encounter (Signed)
RF request for isosobide monontitrate 60 mg ER.  Patient requesting year supply.   Refills appropriate?

## 2019-07-22 DIAGNOSIS — R52 Pain, unspecified: Secondary | ICD-10-CM | POA: Diagnosis not present

## 2019-07-22 DIAGNOSIS — N186 End stage renal disease: Secondary | ICD-10-CM | POA: Diagnosis not present

## 2019-07-22 DIAGNOSIS — E876 Hypokalemia: Secondary | ICD-10-CM | POA: Diagnosis not present

## 2019-07-22 DIAGNOSIS — D509 Iron deficiency anemia, unspecified: Secondary | ICD-10-CM | POA: Diagnosis not present

## 2019-07-22 DIAGNOSIS — D689 Coagulation defect, unspecified: Secondary | ICD-10-CM | POA: Diagnosis not present

## 2019-07-22 DIAGNOSIS — D631 Anemia in chronic kidney disease: Secondary | ICD-10-CM | POA: Diagnosis not present

## 2019-07-22 DIAGNOSIS — Z992 Dependence on renal dialysis: Secondary | ICD-10-CM | POA: Diagnosis not present

## 2019-07-22 DIAGNOSIS — E8770 Fluid overload, unspecified: Secondary | ICD-10-CM | POA: Diagnosis not present

## 2019-07-22 DIAGNOSIS — N2581 Secondary hyperparathyroidism of renal origin: Secondary | ICD-10-CM | POA: Diagnosis not present

## 2019-07-24 ENCOUNTER — Ambulatory Visit (HOSPITAL_COMMUNITY)
Admission: RE | Admit: 2019-07-24 | Discharge: 2019-07-24 | Disposition: A | Discharge status: Home / Self Care | Payer: Medicare Other | Source: Ambulatory Visit | Attending: Chiropractic Medicine | Admitting: Chiropractic Medicine

## 2019-07-24 ENCOUNTER — Other Ambulatory Visit: Payer: Self-pay

## 2019-07-24 DIAGNOSIS — M546 Pain in thoracic spine: Secondary | ICD-10-CM

## 2019-07-24 NOTE — Progress Notes (Signed)
Informed of MRI for today.   Device system confirmed to be MRI conditional, with implant date > 6 weeks ago and no evidence of abandoned or epicardial leads in review of most recent CXR Interrogation from today reviewed, pt is currently AS-VS at 72 bpm  Pt AP 1.3%, VP <1%. No changes necessary for MRI.   Shirley Friar, PA-C  07/24/2019 1:14 PM

## 2019-07-25 DIAGNOSIS — D509 Iron deficiency anemia, unspecified: Secondary | ICD-10-CM | POA: Diagnosis not present

## 2019-07-25 DIAGNOSIS — N186 End stage renal disease: Secondary | ICD-10-CM | POA: Diagnosis not present

## 2019-07-25 DIAGNOSIS — Z992 Dependence on renal dialysis: Secondary | ICD-10-CM | POA: Diagnosis not present

## 2019-07-25 DIAGNOSIS — R52 Pain, unspecified: Secondary | ICD-10-CM | POA: Diagnosis not present

## 2019-07-25 DIAGNOSIS — D631 Anemia in chronic kidney disease: Secondary | ICD-10-CM | POA: Diagnosis not present

## 2019-07-25 DIAGNOSIS — E876 Hypokalemia: Secondary | ICD-10-CM | POA: Diagnosis not present

## 2019-07-25 DIAGNOSIS — N2581 Secondary hyperparathyroidism of renal origin: Secondary | ICD-10-CM | POA: Diagnosis not present

## 2019-07-25 DIAGNOSIS — D689 Coagulation defect, unspecified: Secondary | ICD-10-CM | POA: Diagnosis not present

## 2019-07-25 DIAGNOSIS — E8770 Fluid overload, unspecified: Secondary | ICD-10-CM | POA: Diagnosis not present

## 2019-07-27 DIAGNOSIS — R52 Pain, unspecified: Secondary | ICD-10-CM | POA: Diagnosis not present

## 2019-07-27 DIAGNOSIS — D689 Coagulation defect, unspecified: Secondary | ICD-10-CM | POA: Diagnosis not present

## 2019-07-27 DIAGNOSIS — D509 Iron deficiency anemia, unspecified: Secondary | ICD-10-CM | POA: Diagnosis not present

## 2019-07-27 DIAGNOSIS — Z992 Dependence on renal dialysis: Secondary | ICD-10-CM | POA: Diagnosis not present

## 2019-07-27 DIAGNOSIS — E8770 Fluid overload, unspecified: Secondary | ICD-10-CM | POA: Diagnosis not present

## 2019-07-27 DIAGNOSIS — D631 Anemia in chronic kidney disease: Secondary | ICD-10-CM | POA: Diagnosis not present

## 2019-07-27 DIAGNOSIS — N186 End stage renal disease: Secondary | ICD-10-CM | POA: Diagnosis not present

## 2019-07-27 DIAGNOSIS — N2581 Secondary hyperparathyroidism of renal origin: Secondary | ICD-10-CM | POA: Diagnosis not present

## 2019-07-27 DIAGNOSIS — E876 Hypokalemia: Secondary | ICD-10-CM | POA: Diagnosis not present

## 2019-07-29 DIAGNOSIS — Z992 Dependence on renal dialysis: Secondary | ICD-10-CM | POA: Diagnosis not present

## 2019-07-29 DIAGNOSIS — E8770 Fluid overload, unspecified: Secondary | ICD-10-CM | POA: Diagnosis not present

## 2019-07-29 DIAGNOSIS — N2581 Secondary hyperparathyroidism of renal origin: Secondary | ICD-10-CM | POA: Diagnosis not present

## 2019-07-29 DIAGNOSIS — D631 Anemia in chronic kidney disease: Secondary | ICD-10-CM | POA: Diagnosis not present

## 2019-07-29 DIAGNOSIS — D509 Iron deficiency anemia, unspecified: Secondary | ICD-10-CM | POA: Diagnosis not present

## 2019-07-29 DIAGNOSIS — E876 Hypokalemia: Secondary | ICD-10-CM | POA: Diagnosis not present

## 2019-07-29 DIAGNOSIS — N186 End stage renal disease: Secondary | ICD-10-CM | POA: Diagnosis not present

## 2019-07-29 DIAGNOSIS — R52 Pain, unspecified: Secondary | ICD-10-CM | POA: Diagnosis not present

## 2019-07-29 DIAGNOSIS — D689 Coagulation defect, unspecified: Secondary | ICD-10-CM | POA: Diagnosis not present

## 2019-07-31 DIAGNOSIS — M25512 Pain in left shoulder: Secondary | ICD-10-CM | POA: Diagnosis not present

## 2019-07-31 DIAGNOSIS — M6281 Muscle weakness (generalized): Secondary | ICD-10-CM | POA: Diagnosis not present

## 2019-08-01 ENCOUNTER — Other Ambulatory Visit: Payer: Self-pay

## 2019-08-01 DIAGNOSIS — D509 Iron deficiency anemia, unspecified: Secondary | ICD-10-CM | POA: Diagnosis not present

## 2019-08-01 DIAGNOSIS — N186 End stage renal disease: Secondary | ICD-10-CM | POA: Diagnosis not present

## 2019-08-01 DIAGNOSIS — D689 Coagulation defect, unspecified: Secondary | ICD-10-CM | POA: Diagnosis not present

## 2019-08-01 DIAGNOSIS — E876 Hypokalemia: Secondary | ICD-10-CM | POA: Diagnosis not present

## 2019-08-01 DIAGNOSIS — Z992 Dependence on renal dialysis: Secondary | ICD-10-CM | POA: Diagnosis not present

## 2019-08-01 DIAGNOSIS — E8770 Fluid overload, unspecified: Secondary | ICD-10-CM | POA: Diagnosis not present

## 2019-08-01 DIAGNOSIS — D631 Anemia in chronic kidney disease: Secondary | ICD-10-CM | POA: Diagnosis not present

## 2019-08-01 DIAGNOSIS — N2581 Secondary hyperparathyroidism of renal origin: Secondary | ICD-10-CM | POA: Diagnosis not present

## 2019-08-01 DIAGNOSIS — R52 Pain, unspecified: Secondary | ICD-10-CM | POA: Diagnosis not present

## 2019-08-02 ENCOUNTER — Telehealth: Payer: Self-pay | Admitting: Internal Medicine

## 2019-08-02 ENCOUNTER — Encounter: Payer: Self-pay | Admitting: Family Medicine

## 2019-08-02 ENCOUNTER — Other Ambulatory Visit: Payer: Self-pay

## 2019-08-02 ENCOUNTER — Ambulatory Visit (INDEPENDENT_AMBULATORY_CARE_PROVIDER_SITE_OTHER): Payer: Medicare Other | Admitting: Family Medicine

## 2019-08-02 VITALS — BP 113/87 | HR 91

## 2019-08-02 DIAGNOSIS — S22000G Wedge compression fracture of unspecified thoracic vertebra, subsequent encounter for fracture with delayed healing: Secondary | ICD-10-CM

## 2019-08-02 DIAGNOSIS — N186 End stage renal disease: Secondary | ICD-10-CM | POA: Diagnosis not present

## 2019-08-02 DIAGNOSIS — Z992 Dependence on renal dialysis: Secondary | ICD-10-CM | POA: Diagnosis not present

## 2019-08-02 DIAGNOSIS — G894 Chronic pain syndrome: Secondary | ICD-10-CM | POA: Diagnosis not present

## 2019-08-02 MED ORDER — FENTANYL 50 MCG/HR TD PT72: 1.0000 | MEDICATED_PATCH | TRANSDERMAL | 0 refills | Status: DC

## 2019-08-02 NOTE — Progress Notes (Addendum)
Virtual Visit via Video Note  I connected with Valerie Terrell on 08/02/19 at  1:30 PM EDT by a video enabled telemedicine application and verified that I am speaking with the correct person using two identifiers.  Location patient: home Location provider:work or home office Persons participating in the virtual visit: patient, provider  I discussed the limitations of evaluation and management by telemedicine and the availability of in person appointments. The patient expressed understanding and agreed to proceed.  Telemedicine visit is a necessity given the COVID-19 restrictions in place at the current time.  HPI: 86 y/o WF being seen accompanied by her daughter today for 2 week f/u acute on chronic pain. A/P as of last visit: "Acute-on-chronic sever low back pain, question of new thoracic vertebral compression fracture. Awaiting MRI to be done in 5 d. Fentanyl 25 mcg had no effect in good way or bad way. Will increase to 50 mcg patch q72h, continue cautious use of oxycodone for breakthrough pain. Again we discussed the dangers of opioids regarding resp depression, oversedation possibly leading to falls, constipation, urinary retention, allergic rxn. I discussed use of narcan prn to reverse opioid effect if needed, sent rx for narcan to pharmacy today.  Daughter and Valerie Terrell expressed understanding. Continue calcitonin at this time as well."  Interim hx: She did get her MRI T spine since I last saw her. 07/24/19 MRI T spine w/out contrast: COMPARISON:  MRI of the thoracic spine July 23, 2016.  FINDINGS: Alignment: Increase kyphosis of the thoracic spine centered at T8. This has progressed since prior MRI.  Vertebrae: Status post kyphoplasty for treatment of T8 compression fracture with cement seen within the vertebral body. There is small area of edema in the inferior T8 endplate, below the cement.  T3 compression fracture with approximately 60% loss of vertebral body height with small retropulsion  of the posterior wall into the spinal canal, causing small indentation into the thecal sac. This is new from prior MRI. However, no associated marrow edema is seen, suggesting chronicity. There is also progression of the mild superior endplate compression fracture of the T9 vertebral body. No aggressive bone lesion identified.  Cord:  Normal signal and morphology.  Paraspinal and other soft tissues: Ectatic descending aorta is seen, measuring approximately 3.5 cm at the T5-T6 level. Slightly more pronounced than on prior MRI.  Right renal cysts.  Disc levels:  C7-T1: No spinal canal or neural foraminal stenosis.  T1-2: No spinal canal or neural foraminal stenosis.  T2-3: Small indentation on the thecal sac from the retropulsed posterior wall of T3 without significant spinal canal or neural foraminal stenosis.  T3-4: No spinal canal or neural foraminal stenosis.  T4-5: No spinal canal or neural foraminal stenosis.  T5-6: No spinal canal or neural foraminal stenosis.  T6-7: No spinal canal or neural foraminal stenosis.  T7-8: Small posterior disc protrusion causing small indentation of the thecal sac without significant spinal canal or neural foraminal stenosis.  T8-9: Small posterior disc protrusion causes small indentation of the thecal sac without significant spinal canal or neural foraminal stenosis.  T9-10: Mild facet degenerative changes without significant spinal canal neural foraminal stenosis.  T10-11: Facet degenerative changes and ligamentum flavum redundancy without significant spinal canal or neural foraminal stenosis.  T11-T12: Facet degenerative changes. No spinal canal or neural foraminal stenosis.  T12-L1: Shallow disc bulge and facet degenerative changes. No spinal canal or neural foraminal stenosis.  IMPRESSION: 1. Status post kyphoplasty for treatment of T8 compression fracture. Small area of edema  in the inferior T8 endplate,  below the cement. 2. T3 compression fracture with approximately 60% loss of vertebral body height with small retropulsion of the posterior wall into the spinal canal, without significant spinal canal stenosis. 3. Additional, progression of mild T9 superior endplate compression fracture. 4. Mild thoracic spondylosis without significant spinal canal or neural foraminal stenosis. 5. Ectatic descending aorta measuring approximately up to 3.5 cm in transverse diameter at the T5 level, more pronounced than on prior MRI.  This resulted in ortho referral in [place] for potential kyphoplasty, and this appt is this Friday with Dr. Salem Caster, pain specialist. Her pain is signif improvement (50-60%), still takes oxyc pills some days but no all.  Left LL swelling worse lately, there has been decision to go to 4 days per week HD. She lost her balance and fell recently and  Valerie Terrell referral made again by Levy Pupa with [facility]. Still taking calcitonin nasal spray. NO side effects from increased dose of fentanyl.    ROS: See pertinent positives and negatives per HPI.  Past Medical History:  Diagnosis Date  . Anemia of chronic disease 2018   + anemia of CRI?  [name] Atrophy of left kidney    with absent blood flow by renal artery dopplers (Dr. Gwenlyn Found)  . 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 Chronic combined systolic and diastolic CHF (congestive heart failure) ([place])    a. 11/2016: echo showing EF of 35-40%, RV strain noted, mild MR and mild TR.   Echo showed much improved EF 12/2017 (55-60%)  . Chronic respiratory failure with hypoxia ([place]) 02/11/2017   Now on chronic O2 (intermittent as of summer 2019).    . Closed fracture of right superior pubic ramus ([place]) 07/18/2018   Non op mgmt (Dr. Victorino December was ortho who saw her in hosp but i don't think Valerie Terrell followed up with him).  . Closed right hip fracture ([place]) 10/04/2018   ORIF  . Debilitated patient    WC dependent as of 2018.  Hosp bed +  full assistance with most ADLs s/p hip fx surg 09/2018.  [name] ESRD on hemodialysis ([place]) 01/2017   T/Th/Sat schedule- [place].  Right basilic AV fistula 38/1017.  Graft 2019.  [name] History of adenomatous polyp of colon   . History of kidney stones   . History of subarachnoid hemorrhage 10/2014   after syncope and while on xarelto  . Hyperlipidemia   . Hypertension    Difficult to control, in the setting of one functioning kidney: Valerie Terrell was referred to nephrology by Dr. Gwenlyn Found 06/2015.  . Lumbar radiculopathy 2012  . Lumbar spinal stenosis 2019   facet injections only very short term relief.  L1 and L2 selective nerve root blocks helpful 04/2018.  . Lumbar spondylosis    MR 07/2016---no sign of spinal nerve compression or cord compression.  Valerie Terrell set up with outpt ortho while admitted to hosp 07/2016.  . Malnourished ([place])   . Metatarsal fracture 06/10/2016   Nondisplaced, left 5th metatarsal--Valerie Terrell was referred to ortho  . Osteopenia 2014   T-score -2.1  . PAF (paroxysmal atrial fibrillation) ([facility])    Eliquis started after BRAO and CVA.  She was changed to warfarin 2018. All anticoag stopped due to recurrent falls.  . Presence of permanent cardiac pacemaker   . Right rib fracture 12/2016   s/p fall  . Sick sinus syndrome ([place]) 11/2016   Dual chamber pacer insertion 2018 (Dr. Lovena Le)  . Stroke [facility]) 01/2016  cardioembolic (had CVA while on no anticoag)--"scattered subacute punctate infarcts: 1 in R parietal lobe and 2 in occipital cortex" on MRI br.  CT angio head/neck: aortic arch athero.  R ICA 20% stenosis, L ICA w/out any stenosis.  . Thoracic back pain 01/2017; 01/2018   2018: Facet?  Dr. Ramos->steroid injection.  01/2018-->CT T spine to check for a new comp fx-->none found.  Selective nerve root inj in T spine helpful on R side.   . Thoracic compression fracture ([place]) 07/2016   T3.  T7 and T8-- T8 kyphoplasty during hosp admission 07/2016.  Neuro referred Valerie Terrell to pain mgmt for consideration of  injection 09/2016.  I referred her to endo 08/2016 for consideration of calcitonin treatment.    Past Surgical History:  Procedure Laterality Date  . APPENDECTOMY  child  . AV FISTULA PLACEMENT Right 03/31/2017   Procedure: Right ARM Basilic vein transposition;  Surgeon: Conrad Houston, MD;  Location: [facility] OR;  Service: Vascular;  Laterality: Right;  . AV FISTULA PLACEMENT Right 12/06/2017   Procedure: INSERTION OF ARTERIOVENOUS (AV) GORE-TEX GRAFT ARM RIGHT UPPER EXTREMITY;  Surgeon: [name], MD;  Location: [facility];  Service: Vascular;  Laterality: Right;  . CARDIOVASCULAR STRESS TEST  01/31/2016   Stress myoview: NORMAL/Low risk.  EF 56%.  . [place]  . COLONOSCOPY  2015   + hx of adenomatous polyps.  Need digest health spec in [place] records to see when Valerie Terrell due for next colonoscopy  . DECLOT CKV Right 03/21/2019  . EYE SURGERY Bilateral    Catarct  . INSERTION OF DIALYSIS CATHETER Right 01/29/2017   Procedure: INSERTION OF DIALYSIS CATHETER- RIGHT INTERNAL JUGULAR;  Surgeon: [name], MD;  Location: [place];  Service: Vascular;  Laterality: Right;  . INTRAMEDULLARY (IM) NAIL INTERTROCHANTERIC Right 10/04/2018   Procedure: INTRAMEDULLARY (IM) NAIL INTERTROCHANTRIC;  Surgeon: Nicholes Stairs, MD;  Location: [place];  Service: Orthopedics;  Laterality: Right;  . IR GENERIC HISTORICAL  07/24/2016   IR KYPHO THORACIC WITH BONE BIOPSY 07/24/2016 Luanne Bras, MD [facility]  . KYPHOPLASTY  07/27/2016   T8  . PACEMAKER IMPLANT N/A 12/03/2016   Procedure: Pacemaker Implant;  Surgeon: Evans Lance, MD;  Location: [facility];  Service: Cardiovascular;  Laterality: N/A;  . Renal artery dopplers  02/26/2016   Her right renal dimension was 11 cm pole to pole with mild to moderate right renal artery stenosis. A right renal aortic ratio was 3.22 suggesting less than a 50% stenosis.  . Right upper arm AV Gore-Tex graft  12/06/2017   Dr. Donnetta Hutching  . TONSILLECTOMY    .  TRANSTHORACIC ECHOCARDIOGRAM  01/29/2016; 11/2016; 12/2017   2017: EF 55-60%, normal LV wall motion, grade I DD.  No cardiac source of emboli was seen.  2018: EF 35-40%, could not assess DD due to a-fib, pulm HTN noted. 12/2017 EF 55-60%, nl wall motion, grd II DD, mod MR and pulm regurg, increased pulm pressure (peak 52).    Family History  Problem Relation Age of Onset  . Cancer Mother   . Heart disease Father   . Early death Father   . Sudden Cardiac Death Neg Hx      Current Outpatient Medications:  .  acetaminophen (TYLENOL) 500 MG tablet, Take 500-1,000 mg 3 (three) times daily as needed by mouth for moderate pain. , Disp: , Rfl:  .  amiodarone (PACERONE) 200 MG tablet, TAKE 1 TABLET BY MOUTH  DAILY,  Disp: 90 tablet, Rfl: 1 .  aspirin EC 325 MG tablet, Take 325 mg by mouth every evening., Disp: , Rfl:  .  b complex-vitamin c-folic acid (NEPHRO-VITE) 0.8 MG TABS tablet, Take 1 tablet by mouth See admin instructions. Take one tablet by mouth on Tuesday, Thursday, Saturday - after dialysis, Disp: , Rfl:  .  calcitonin, salmon, (MIACALCIN/FORTICAL) 200 UNIT/ACT nasal spray, , Disp: , Rfl:  .  carvedilol (COREG) 3.125 MG tablet, TAKE 1 TABLET BY MOUTH TWO  TIMES DAILY, Disp: 180 tablet, Rfl: 1 .  DENTA 5000 PLUS 1.1 % CREA dental cream, BRUSH ON MORNING AND NIGHT DO NOT RINSE, Disp: , Rfl:  .  diclofenac (FLECTOR) 1.3 % PTCH, Flector 1.3 % transdermal 12 hour patch  Apply 1 patch twice a day by transdermal route., Disp: , Rfl:  .  docusate sodium (COLACE) 100 MG capsule, Take 100 mg by mouth at bedtime. , Disp: , Rfl:  .  doxercalciferol (HECTOROL) 2.5 MCG capsule, Take 3 mg by mouth as needed., Disp: , Rfl:  .  fentaNYL (DURAGESIC) 50 MCG/HR, Place 1 patch onto the skin every 3 (three) days., Disp: 5 patch, Rfl: 0 .  heparin 1000 unit/mL SOLN injection, Heparin Sodium (Porcine) 1,000 Units/mL Systemic, Disp: , Rfl:  .  heparin 1000 unit/mL SOLN injection, Heparin Sodium (Porcine) 1,000  Units/mL Systemic, Disp: , Rfl:  .  isosorbide mononitrate (IMDUR) 60 MG 24 hr tablet, TAKE 1 TABLET BY MOUTH  DAILY ; TAKE 1 TABLET ON  SUN, MON, WED, FRI MORNINGS AND 1 TABLET ON TUES, THU,  SAT AFTER DIALYSIS, Disp: 90 tablet, Rfl: 3 .  lidocaine-prilocaine (EMLA) cream, Apply 1 application topically See admin instructions. Apply small amount to access site (AVF) one hour before dialysis, cover with occlusive dressing (saran wrap), Disp: , Rfl:  .  Methoxy PEG-Epoetin Beta (MIRCERA IJ), Mircera, Disp: , Rfl:  .  Multiple Vitamins-Minerals (MULTIVITAMIN WITH MINERALS) tablet, Take 1 tablet by mouth at bedtime. , Disp: , Rfl:  .  naloxone (NARCAN) 0.4 MG/ML injection, Inject IM as needed for reversal of opioid pain medication, Disp: 1 mL, Rfl: 1 .  ondansetron (ZOFRAN) 4 MG tablet, Take 1 tablet (4 mg total) by mouth every 8 (eight) hours as needed for nausea or vomiting., Disp: 60 tablet, Rfl: 1 .  Oxycodone HCl 10 MG TABS, 1-2 tabs po q6h prn severe pain, Disp: 90 tablet, Rfl: 0 .  polyvinyl alcohol (LIQUIFILM TEARS) 1.4 % ophthalmic solution, Place 1-2 drops into both eyes daily as needed for dry eyes. , Disp: , Rfl:  .  pravastatin (PRAVACHOL) 40 MG tablet, TAKE 1 TABLET BY MOUTH  EVERY EVENING (Patient taking differently: Take 40 mg by mouth at bedtime. ), Disp: 90 tablet, Rfl: 1 .  sevelamer carbonate (RENVELA) 800 MG tablet, Take 800-2,400 mg by mouth See admin instructions. Take two - three tablets (1600 mg - 2400 mg) by mouth up to three times daily with meals, take one tablet (800 mg) with snacks, Disp: , Rfl:  .  vitamin C (ASCORBIC ACID) 500 MG tablet, Take 500 mg by mouth daily., Disp: , Rfl:  .  Vitamin D, Ergocalciferol, (DRISDOL) 1.25 MG (50000 UT) CAPS capsule, Take 1 capsule (50,000 Units total) by mouth every Thursday., Disp: 12 capsule, Rfl: 3  EXAM:  VITALS per patient if applicable: BP 539/76 (BP Location: Left Arm, Patient Position: Sitting, Cuff Size: Normal)   Pulse 91    LMP  (LMP Unknown)   SpO2  97%    GENERAL: alert, oriented, appears well and in no acute distress  HEENT: atraumatic, conjunttiva clear, no obvious abnormalities on inspection of external nose and ears  NECK: normal movements of the head and neck  LUNGS: on inspection no signs of respiratory distress, breathing rate appears normal, no obvious gross SOB, gasping or wheezing  CV: no obvious cyanosis  MS: moves all visible extremities without noticeable abnormality  PSYCH/NEURO: pleasant and cooperative, no obvious depression or anxiety, speech and thought processing grossly intact  LABS: none today    Chemistry      Component Value Date/Time   NA 136 07/04/2019 1245   NA 138 06/23/2018 0000   K 4.9 07/04/2019 1245   CL 94 (L) 07/04/2019 1245   CO2 27 07/04/2019 1245   BUN 13 07/04/2019 1245   BUN 30 (A) 06/23/2018 0000   CREATININE 4.67 (H) 07/04/2019 1245   CREATININE 1.63 (H) 03/24/2016 0110   GLU 128 09/01/2016 0000      Component Value Date/Time   CALCIUM 8.5 (L) 07/04/2019 1245   CALCIUM 9.5 02/16/2017 0232   ALKPHOS 59 07/18/2018 2236   AST 17 07/18/2018 2236   ALT 9 07/18/2018 2236   BILITOT 0.5 07/18/2018 2236   BILITOT 0.2 06/17/2016 0819     Lab Results  Component Value Date   WBC 9.5 07/04/2019   HGB 11.4 (L) 07/04/2019   HCT 37.3 07/04/2019   MCV 98.9 07/04/2019   PLT 235 07/04/2019   Lab Results  Component Value Date   HGBA1C 5.2 12/15/2016   Lab Results  Component Value Date   TSH 2.530 01/13/2019    ASSESSMENT AND PLAN:  Discussed the following assessment and plan:  1) Acute-on-chronic thoracic back pain, osteoporotic compression fractures. Pain control MUCH improved on 50 mcg q72h fentanyl patch.  Using much less oxycodone. I did rx for fentanyl 50 mcg, 1 patch q72h, #5 today.  No additional rx for oxycodone was needed at this time. She will have initial consult with pain specialist, Dr. Salem Caster, at [facility] in 2d.  Kyphoplasty treatment is  anticipated at some point in near future.  She'll continue calcitonin nasal spray, fentany patch 6mcg q72h, and oxycodone 10mg , 1-2 q6h prn breakthrough pain.  Fortunately she has no adverse side effects from any of her meds at this time.  2) Increased peripheral edema: she will be starting 4 time per week HD soon.  3) Debility: needs hospital bed to aid in minimizing pain and reduce pressure.  The patient requires frequent repositioning of the body in ways that cannot be achieved with an ordinary bed or wedge pillow.   -we discussed possible serious and likely etiologies, options for evaluation and workup, limitations of telemedicine visit vs in person visit, treatment, treatment risks and precautions. Valerie Terrell prefers to treat via telemedicine empirically rather then risking or undertaking an in person visit at this moment. Patient agrees to seek prompt in person care if worsening, new symptoms arise, or if is not improving with treatment.   I discussed the assessment and treatment plan with the patient. The patient was provided an opportunity to ask questions and all were answered. The patient agreed with the plan and demonstrated an understanding of the instructions.   The patient was advised to call back or seek an in-person evaluation if the symptoms worsen or if the condition fails to improve as anticipated.  F/u: 1 mo  Signed:  Crissie Sickles, MD  08/02/2019  

## 2019-08-02 NOTE — Telephone Encounter (Signed)
Scheduled [facility] Palliative visit for 08-25-19 at 3:00.

## 2019-08-03 ENCOUNTER — Telehealth: Payer: Self-pay | Admitting: *Deleted

## 2019-08-03 DIAGNOSIS — Z992 Dependence on renal dialysis: Secondary | ICD-10-CM | POA: Diagnosis not present

## 2019-08-03 DIAGNOSIS — E8770 Fluid overload, unspecified: Secondary | ICD-10-CM | POA: Diagnosis not present

## 2019-08-03 DIAGNOSIS — D509 Iron deficiency anemia, unspecified: Secondary | ICD-10-CM | POA: Diagnosis not present

## 2019-08-03 DIAGNOSIS — E876 Hypokalemia: Secondary | ICD-10-CM | POA: Diagnosis not present

## 2019-08-03 DIAGNOSIS — N2581 Secondary hyperparathyroidism of renal origin: Secondary | ICD-10-CM | POA: Diagnosis not present

## 2019-08-03 DIAGNOSIS — D689 Coagulation defect, unspecified: Secondary | ICD-10-CM | POA: Diagnosis not present

## 2019-08-03 DIAGNOSIS — R52 Pain, unspecified: Secondary | ICD-10-CM | POA: Diagnosis not present

## 2019-08-03 DIAGNOSIS — N186 End stage renal disease: Secondary | ICD-10-CM | POA: Diagnosis not present

## 2019-08-03 DIAGNOSIS — D631 Anemia in chronic kidney disease: Secondary | ICD-10-CM | POA: Diagnosis not present

## 2019-08-03 NOTE — Telephone Encounter (Signed)
Left message for patient, we received a copy of CT scan that was done by [facility], it reports a ectatic descending aorta @ 3.5 cm. Discussed with dr berry, no concern at this time.

## 2019-08-04 ENCOUNTER — Encounter: Payer: Self-pay | Admitting: Family Medicine

## 2019-08-04 DIAGNOSIS — S22030A Wedge compression fracture of third thoracic vertebra, initial encounter for closed fracture: Secondary | ICD-10-CM | POA: Diagnosis not present

## 2019-08-04 NOTE — Telephone Encounter (Signed)
The increase to 4 times per week dialysis will likely be the only thing that will really help this. They could also call her nephrologist's office to see if a diuretic is a possibility but that is not a choice for me to make since I am not a specialist in the area of end stage kidney disease. Sorry I can't help more.

## 2019-08-04 NOTE — Telephone Encounter (Signed)
Pt's daughter is concerned regarding peripheral edema, fluid is more than usual. Last seen 3/17, "Increased peripheral edema: she will be starting 4 time per week HD soon."   Please advise, thanks.

## 2019-08-05 DIAGNOSIS — R52 Pain, unspecified: Secondary | ICD-10-CM | POA: Diagnosis not present

## 2019-08-05 DIAGNOSIS — N2581 Secondary hyperparathyroidism of renal origin: Secondary | ICD-10-CM | POA: Diagnosis not present

## 2019-08-05 DIAGNOSIS — N186 End stage renal disease: Secondary | ICD-10-CM | POA: Diagnosis not present

## 2019-08-05 DIAGNOSIS — Z992 Dependence on renal dialysis: Secondary | ICD-10-CM | POA: Diagnosis not present

## 2019-08-05 DIAGNOSIS — D509 Iron deficiency anemia, unspecified: Secondary | ICD-10-CM | POA: Diagnosis not present

## 2019-08-05 DIAGNOSIS — D689 Coagulation defect, unspecified: Secondary | ICD-10-CM | POA: Diagnosis not present

## 2019-08-05 DIAGNOSIS — D631 Anemia in chronic kidney disease: Secondary | ICD-10-CM | POA: Diagnosis not present

## 2019-08-05 DIAGNOSIS — E876 Hypokalemia: Secondary | ICD-10-CM | POA: Diagnosis not present

## 2019-08-05 DIAGNOSIS — E8770 Fluid overload, unspecified: Secondary | ICD-10-CM | POA: Diagnosis not present

## 2019-08-08 DIAGNOSIS — D631 Anemia in chronic kidney disease: Secondary | ICD-10-CM | POA: Diagnosis not present

## 2019-08-08 DIAGNOSIS — Z992 Dependence on renal dialysis: Secondary | ICD-10-CM | POA: Diagnosis not present

## 2019-08-08 DIAGNOSIS — N186 End stage renal disease: Secondary | ICD-10-CM | POA: Diagnosis not present

## 2019-08-08 DIAGNOSIS — R52 Pain, unspecified: Secondary | ICD-10-CM | POA: Diagnosis not present

## 2019-08-08 DIAGNOSIS — E8770 Fluid overload, unspecified: Secondary | ICD-10-CM | POA: Diagnosis not present

## 2019-08-08 DIAGNOSIS — D509 Iron deficiency anemia, unspecified: Secondary | ICD-10-CM | POA: Diagnosis not present

## 2019-08-08 DIAGNOSIS — N2581 Secondary hyperparathyroidism of renal origin: Secondary | ICD-10-CM | POA: Diagnosis not present

## 2019-08-08 DIAGNOSIS — D689 Coagulation defect, unspecified: Secondary | ICD-10-CM | POA: Diagnosis not present

## 2019-08-08 DIAGNOSIS — E876 Hypokalemia: Secondary | ICD-10-CM | POA: Diagnosis not present

## 2019-08-10 ENCOUNTER — Encounter: Payer: Self-pay | Admitting: Family Medicine

## 2019-08-10 DIAGNOSIS — D631 Anemia in chronic kidney disease: Secondary | ICD-10-CM | POA: Diagnosis not present

## 2019-08-10 DIAGNOSIS — N2581 Secondary hyperparathyroidism of renal origin: Secondary | ICD-10-CM | POA: Diagnosis not present

## 2019-08-10 DIAGNOSIS — D509 Iron deficiency anemia, unspecified: Secondary | ICD-10-CM | POA: Diagnosis not present

## 2019-08-10 DIAGNOSIS — E8770 Fluid overload, unspecified: Secondary | ICD-10-CM | POA: Diagnosis not present

## 2019-08-10 DIAGNOSIS — E876 Hypokalemia: Secondary | ICD-10-CM | POA: Diagnosis not present

## 2019-08-10 DIAGNOSIS — D689 Coagulation defect, unspecified: Secondary | ICD-10-CM | POA: Diagnosis not present

## 2019-08-10 DIAGNOSIS — Z992 Dependence on renal dialysis: Secondary | ICD-10-CM | POA: Diagnosis not present

## 2019-08-10 DIAGNOSIS — R52 Pain, unspecified: Secondary | ICD-10-CM | POA: Diagnosis not present

## 2019-08-10 DIAGNOSIS — N186 End stage renal disease: Secondary | ICD-10-CM | POA: Diagnosis not present

## 2019-08-11 ENCOUNTER — Emergency Department (HOSPITAL_COMMUNITY): Payer: Medicare Other

## 2019-08-11 ENCOUNTER — Other Ambulatory Visit: Payer: Self-pay

## 2019-08-11 ENCOUNTER — Telehealth: Payer: Self-pay

## 2019-08-11 ENCOUNTER — Emergency Department (HOSPITAL_COMMUNITY)
Admission: EM | Admit: 2019-08-11 | Discharge: 2019-08-11 | Disposition: A | Discharge status: Home / Self Care | Payer: Medicare Other | Attending: Emergency Medicine | Admitting: Emergency Medicine

## 2019-08-11 ENCOUNTER — Encounter (HOSPITAL_COMMUNITY): Payer: Self-pay | Admitting: Emergency Medicine

## 2019-08-11 DIAGNOSIS — R609 Edema, unspecified: Secondary | ICD-10-CM | POA: Diagnosis not present

## 2019-08-11 DIAGNOSIS — I5042 Chronic combined systolic (congestive) and diastolic (congestive) heart failure: Secondary | ICD-10-CM | POA: Diagnosis not present

## 2019-08-11 DIAGNOSIS — Z743 Need for continuous supervision: Secondary | ICD-10-CM | POA: Diagnosis not present

## 2019-08-11 DIAGNOSIS — Z87891 Personal history of nicotine dependence: Secondary | ICD-10-CM | POA: Diagnosis not present

## 2019-08-11 DIAGNOSIS — R0600 Dyspnea, unspecified: Secondary | ICD-10-CM | POA: Diagnosis not present

## 2019-08-11 DIAGNOSIS — R531 Weakness: Secondary | ICD-10-CM | POA: Diagnosis present

## 2019-08-11 DIAGNOSIS — Z79899 Other long term (current) drug therapy: Secondary | ICD-10-CM | POA: Diagnosis not present

## 2019-08-11 DIAGNOSIS — N186 End stage renal disease: Secondary | ICD-10-CM | POA: Insufficient documentation

## 2019-08-11 DIAGNOSIS — Z950 Presence of cardiac pacemaker: Secondary | ICD-10-CM | POA: Insufficient documentation

## 2019-08-11 DIAGNOSIS — R0602 Shortness of breath: Secondary | ICD-10-CM | POA: Diagnosis not present

## 2019-08-11 DIAGNOSIS — Z7982 Long term (current) use of aspirin: Secondary | ICD-10-CM | POA: Diagnosis not present

## 2019-08-11 DIAGNOSIS — I132 Hypertensive heart and chronic kidney disease with heart failure and with stage 5 chronic kidney disease, or end stage renal disease: Secondary | ICD-10-CM | POA: Insufficient documentation

## 2019-08-11 DIAGNOSIS — Z992 Dependence on renal dialysis: Secondary | ICD-10-CM | POA: Insufficient documentation

## 2019-08-11 DIAGNOSIS — R5383 Other fatigue: Secondary | ICD-10-CM | POA: Insufficient documentation

## 2019-08-11 DIAGNOSIS — Z8673 Personal history of transient ischemic attack (TIA), and cerebral infarction without residual deficits: Secondary | ICD-10-CM | POA: Diagnosis not present

## 2019-08-11 DIAGNOSIS — I959 Hypotension, unspecified: Secondary | ICD-10-CM | POA: Diagnosis not present

## 2019-08-11 DIAGNOSIS — R0902 Hypoxemia: Secondary | ICD-10-CM | POA: Diagnosis not present

## 2019-08-11 DIAGNOSIS — R000 Tachycardia, unspecified: Secondary | ICD-10-CM | POA: Diagnosis not present

## 2019-08-11 LAB — COMPREHENSIVE METABOLIC PANEL
ALT: 15 U/L (ref 0–44)
AST: 23 U/L (ref 15–41)
Albumin: 2.3 g/dL — ABNORMAL LOW (ref 3.5–5.0)
Alkaline Phosphatase: 87 U/L (ref 38–126)
Anion gap: 18 — ABNORMAL HIGH (ref 5–15)
BUN: 11 mg/dL (ref 8–23)
CO2: 28 mmol/L (ref 22–32)
Calcium: 8.3 mg/dL — ABNORMAL LOW (ref 8.9–10.3)
Chloride: 92 mmol/L — ABNORMAL LOW (ref 98–111)
Creatinine, Ser: 3.57 mg/dL — ABNORMAL HIGH (ref 0.44–1.00)
GFR calc Af Amer: 13 mL/min — ABNORMAL LOW (ref >60)
GFR calc non Af Amer: 11 mL/min — ABNORMAL LOW (ref >60)
Glucose, Bld: 93 mg/dL (ref 70–99)
Potassium: 4.3 mmol/L (ref 3.5–5.1)
Sodium: 138 mmol/L (ref 135–145)
Total Bilirubin: 0.6 mg/dL (ref 0.3–1.2)
Total Protein: 5.8 g/dL — ABNORMAL LOW (ref 6.5–8.1)

## 2019-08-11 LAB — CBC WITH DIFFERENTIAL/PLATELET
Abs Immature Granulocytes: 0.06 10*3/uL (ref 0.00–0.07)
Basophils Absolute: 0 10*3/uL (ref 0.0–0.1)
Basophils Relative: 1 %
Eosinophils Absolute: 0 10*3/uL (ref 0.0–0.5)
Eosinophils Relative: 0 %
HCT: 40.7 % (ref 36.0–46.0)
Hemoglobin: 12.3 g/dL (ref 12.0–15.0)
Immature Granulocytes: 1 %
Lymphocytes Relative: 10 %
Lymphs Abs: 0.8 10*3/uL (ref 0.7–4.0)
MCH: 30.8 pg (ref 26.0–34.0)
MCHC: 30.2 g/dL (ref 30.0–36.0)
MCV: 101.8 fL — ABNORMAL HIGH (ref 80.0–100.0)
Monocytes Absolute: 1.2 10*3/uL — ABNORMAL HIGH (ref 0.1–1.0)
Monocytes Relative: 16 %
Neutro Abs: 5.8 10*3/uL (ref 1.7–7.7)
Neutrophils Relative %: 72 %
Platelets: 176 10*3/uL (ref 150–400)
RBC: 4 MIL/uL (ref 3.87–5.11)
RDW: 16.5 % — ABNORMAL HIGH (ref 11.5–15.5)
WBC: 8 10*3/uL (ref 4.0–10.5)
nRBC: 0 % (ref 0.0–0.2)

## 2019-08-11 LAB — BRAIN NATRIURETIC PEPTIDE: B Natriuretic Peptide: 922.1 pg/mL — ABNORMAL HIGH (ref 0.0–100.0)

## 2019-08-11 LAB — CBG MONITORING, ED: Glucose-Capillary: 105 mg/dL — ABNORMAL HIGH (ref 70–99)

## 2019-08-11 NOTE — ED Notes (Signed)
Pt ambulated with pulse oxymetry, oxygen sat dropped to 82% and pt only ambulated 84ft with walker, and report feeling very weak and short of breath. Provider was at the room while ambulation

## 2019-08-11 NOTE — Telephone Encounter (Signed)
I will repeat what I said a couple days ago. She will ALWAYS  have problems with swelling and pain in her legs.  The increased frequency of dialysis will help but not CURE this problem.  I recommend she ask her nephrologist for any recommendations such as the possibility of diuretics. I am not adequately trained to manage this particular problem for her.  I'm sorry.

## 2019-08-11 NOTE — Telephone Encounter (Signed)
Patient was seen at ED for peripheral edema. She had EKG, chest XR and bloodwork done, advised to return if further symptoms. She is supposed to start the 4 times weekly dialysis treatments tomorrow but has not been confirmed yet. She would like to know if you had any recommendations for her.  Please advise, thanks.

## 2019-08-11 NOTE — ED Notes (Signed)
Unable to ambulate patient to check SpO2, pt. C/o weakness with unsteady gait.

## 2019-08-11 NOTE — ED Notes (Signed)
Pt. Transported to imaging

## 2019-08-11 NOTE — ED Notes (Signed)
Patient denies pain and is resting comfortably.  

## 2019-08-11 NOTE — ED Triage Notes (Signed)
Pt. Presents from home via [facility] c/o Generalized weakness and fatigue associated DOE with lower bilateral leg swelling, 3+ pitting edema, for the past month. Pt. Relates HD three times a week, T-Th-Sat. Pt. Relates nephrologist wanting to increase HD to four times a week but has not been able to receive the fourth treatment yet.

## 2019-08-11 NOTE — Discharge Instructions (Addendum)
Please use compression stockings to help decrease swelling in your legs.  Please elevate legs when you are resting and follow recommendations from your nephrologist.  Please follow-up with the [facility] dialysis center tomorrow.  You are scheduled Monday, Tuesday, Thursday, Saturday.  This will be your weekly schedule.

## 2019-08-11 NOTE — ED Provider Notes (Signed)
[facility] EMERGENCY DEPARTMENT Provider Note   CSN: [id number] Arrival date & time: 08/11/19  1950     History Chief Complaint  Patient presents with  . Weakness  . Leg Swelling    Valerie Terrell is a 86 y.o. female   HPI Patient is an 86 year old female with a significant past medical history of CKD on dialysis, PAF on no anticoagulation after multiple falls, stroke, chronic back pain, wheelchair dependence/physical debilitation, functional single kidney.   Patient presents today from home via EMS for complaints of generalized weakness and fatigue as well as dyspnea on exertion and lower extremity swelling for the past month.  She states that the swelling has gotten somewhat worse over the past few days and states that she has weeping from her right leg at night that makes her sheets wet.  She states that she props her self up on 2 pillows has not had to increase this for some time.  She states she occasionally wakes up with symptoms consistent with PND that are unchanged from her baseline.  Patient denies any chest pain, focal weakness, headache, lightheadedness or dizziness.  I discussed with [name] which is the patient's daughter who is at bedside.  Daughter expresses concern over inability to set up 4 times a week dialysis.  States that she follows closely with her nephrologist Dr. Jamal Maes who recommended the 4 times weekly dialysis.    Past Medical History:  Diagnosis Date  . Anemia of chronic disease 2018   + anemia of CRI?  [name] Atrophy of left kidney    with absent blood flow by renal artery dopplers (Dr. Gwenlyn Found)  . 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 Chronic combined systolic and diastolic CHF (congestive heart failure) ([place])    a. 11/2016: echo showing EF of 35-40%, RV strain noted, mild MR and mild TR.   Echo showed much improved EF 12/2017 (55-60%)  . Chronic respiratory failure with hypoxia ([place]) 02/11/2017   Now on chronic O2  (intermittent as of summer 2019).    . Closed fracture of right superior pubic ramus ([place]) 07/18/2018   Non op mgmt (Dr. Victorino December was ortho who saw her in hosp but i don't think pt followed up with him).  . Closed right hip fracture ([place]) 10/04/2018   ORIF  . Debilitated patient    WC dependent as of 2018.  Hosp bed + full assistance with most ADLs s/p hip fx surg 09/2018.  [name] ESRD on hemodialysis ([place]) 01/2017   T/Th/Sat schedule- [place].  Right basilic AV fistula 93/2671.  Graft 2019.  [name] History of adenomatous polyp of colon   . History of kidney stones   . History of subarachnoid hemorrhage 10/2014   after syncope and while on xarelto  . Hyperlipidemia   . Hypertension    Difficult to control, in the setting of one functioning kidney: pt was referred to nephrology by Dr. Gwenlyn Found 06/2015.  . Lumbar radiculopathy 2012  . Lumbar spinal stenosis 2019   facet injections only very short term relief.  L1 and L2 selective nerve root blocks helpful 04/2018.  . Lumbar spondylosis    MR 07/2016---no sign of spinal nerve compression or cord compression.  Pt set up with outpt ortho while admitted to hosp 07/2016.  . Malnourished ([place])   . Metatarsal fracture 06/10/2016   Nondisplaced, left 5th metatarsal--pt was referred to ortho  . Osteopenia 2014   T-score -2.1  . PAF (paroxysmal  atrial fibrillation) ([place])    Eliquis started after BRAO and CVA.  She was changed to warfarin 2018. All anticoag stopped due to recurrent falls.  . Presence of permanent cardiac pacemaker   . Right rib fracture 12/2016   s/p fall  . Sick sinus syndrome ([place]) 11/2016   Dual chamber pacer insertion 2018 (Dr. Lovena Le)  . Stroke ([place]) 05/6008   cardioembolic (had CVA while on no anticoag)--"scattered subacute punctate infarcts: 1 in R parietal lobe and 2 in occipital cortex" on MRI br.  CT angio head/neck: aortic arch athero.  R ICA 20% stenosis, L ICA w/out any stenosis.  . Thoracic back pain 01/2017; 01/2018    2018: Facet?  Dr. Ramos->steroid injection.  01/2018-->CT T spine to check for a new comp fx-->none found.  Selective nerve root inj in T spine helpful on R side.   . Thoracic compression fracture ([place]) 07/2016   T3.  T7 and T8-- T8 kyphoplasty during hosp admission 07/2016.  Neuro referred pt to pain mgmt for consideration of injection 09/2016.  I referred her to endo 08/2016 for consideration of calcitonin treatment.    Patient Active Problem List   Diagnosis Date Noted  . Disorder of phosphorus metabolism, unspecified 05/15/2019  . Hypoxemia 03/24/2019  . Anaphylactic shock, unspecified, sequela 02/23/2019  . Heterotopic ossification 12/29/2018  . Pain, unspecified 10/15/2018  . Hip fracture ([place]) 10/04/2018  . Fracture of pubic ramus ([place]) 07/19/2018  . Fracture of sacrum ([place]) 07/19/2018  . Perineal hematoma 07/19/2018  . Fever 07/19/2018  . Acute blood loss anemia 07/19/2018  . ESRD (end stage renal disease) on dialysis ([place]) 05/20/2018  . Chronic back pain 05/14/2018  . Chronic pain syndrome 05/14/2018  . Iron deficiency anemia, unspecified 04/25/2018  . Hypoxia 12/14/2017  . Dyspnea 12/14/2017  . Secondary hyperparathyroidism of renal origin ([place]) 08/16/2017  . Degeneration of thoracic intervertebral disc 07/26/2017  . Moderate protein-calorie malnutrition ([place]) 02/23/2017  . Acute respiratory failure ([place]) 02/13/2017  . Hyperkalemia 02/13/2017  . Chronic respiratory failure with hypoxia ([place]) 02/11/2017  . Debilitated patient 02/07/2017  . Poor tolerance for ambulation 02/07/2017  . Anemia in chronic kidney disease 02/04/2017  . Coagulation defect, unspecified ([place]) 02/04/2017  . Fracture of one rib, right side, initial encounter for closed fracture 01/11/2017  . Closed fracture of one rib of right side   . Chronic combined systolic and diastolic heart failure ([place]) 01/05/2017  . Palpitations 12/15/2016  . Hypokalemia 12/14/2016  . Leukocytosis 12/14/2016  . Long term  (current) use of anticoagulants [Z79.01] 12/11/2016  . Non-ischemic cardiomyopathy ([place]) 12/07/2016  . Acute combined systolic and diastolic heart failure ([place])  12/07/2016  . Cardiac pacemaker   . Acute on chronic respiratory failure with hypoxia ([place])   . Acute pulmonary edema with congestive heart failure ([place]) 11/30/2016  . Elevated troponin level 11/30/2016  . Anemia 11/30/2016  . Chronic midline thoracic back pain 09/15/2016  . History of CVA (cerebrovascular accident) 09/15/2016  . Hyperlipidemia   . Nausea & vomiting 08/05/2016  . Dehydration 08/05/2016  . Osteoporosis 07/28/2016  . Closed compression fracture of thoracic vertebra ([place]) 07/23/2016  . Thoracic compression fracture ([place]) 07/22/2016  . Flank pain 07/11/2016  . Hypoalbuminemia 07/11/2016  . Renal artery stenosis ([place]) 06/24/2016  . Nondisplaced fracture of fifth metatarsal bone, left foot, initial encounter for closed fracture 06/10/2016  . Rib contusion, left, initial encounter 06/10/2016  . Single kidney 03/13/2016  . Chest pain   . Dyslipidemia   .  PAF (paroxysmal atrial fibrillation) ([place]) 01/28/2016  . Essential hypertension 01/28/2016  . History of cardioembolic stroke 16/02/9603  . Personal history of subarachnoid hemorrhage 01/28/2016  . CKD (chronic kidney disease), stage IV ([place]) 01/28/2016  . Gait disturbance 01/27/2016  . Mitral valve disease 01/08/2014  . Colon polyps 07/10/2013  . Lumbar radiculopathy 02/04/2011    Past Surgical History:  Procedure Laterality Date  . APPENDECTOMY  child  . AV FISTULA PLACEMENT Right 03/31/2017   Procedure: Right ARM Basilic vein transposition;  Surgeon: Conrad Soldotna, MD;  [facility];  Service: Vascular;  Laterality: Right;  . AV FISTULA PLACEMENT Right 12/06/2017   Procedure: INSERTION OF ARTERIOVENOUS (AV) GORE-TEX GRAFT ARM RIGHT UPPER EXTREMITY;  Surgeon: [name], MD;  Location: [facility];  Service: Vascular;  Laterality: Right;  . CARDIOVASCULAR  STRESS TEST  01/31/2016   Stress myoview: NORMAL/Low risk.  EF 56%.  . [place]  . COLONOSCOPY  2015   + hx of adenomatous polyps.  Need digest health spec in [facility] records to see when pt due for next colonoscopy  . DECLOT CKV Right 03/21/2019  . EYE SURGERY Bilateral    Catarct  . INSERTION OF DIALYSIS CATHETER Right 01/29/2017   Procedure: INSERTION OF DIALYSIS CATHETER- RIGHT INTERNAL JUGULAR;  Surgeon: [name], MD;  Location: [facility];  Service: Vascular;  Laterality: Right;  . INTRAMEDULLARY (IM) NAIL INTERTROCHANTERIC Right 10/04/2018   Procedure: INTRAMEDULLARY (IM) NAIL INTERTROCHANTRIC;  Surgeon: Nicholes Stairs, MD;  Location: [facility];  Service: Orthopedics;  Laterality: Right;  . IR GENERIC HISTORICAL  07/24/2016   IR KYPHO THORACIC WITH BONE BIOPSY 07/24/2016 Luanne Bras, MD [facility]-INTERV RAD  . KYPHOPLASTY  07/27/2016   T8  . PACEMAKER IMPLANT N/A 12/03/2016   Procedure: Pacemaker Implant;  Surgeon: Evans Lance, MD;  Location: [facility];  Service: Cardiovascular;  Laterality: N/A;  . Renal artery dopplers  02/26/2016   Her right renal dimension was 11 cm pole to pole with mild to moderate right renal artery stenosis. A right renal aortic ratio was 3.22 suggesting less than a 50% stenosis.  . Right upper arm AV Gore-Tex graft  12/06/2017   Dr. Donnetta Hutching  . TONSILLECTOMY    . TRANSTHORACIC ECHOCARDIOGRAM  01/29/2016; 11/2016; 12/2017   2017: EF 55-60%, normal LV wall motion, grade I DD.  No cardiac source of emboli was seen.  2018: EF 35-40%, could not assess DD due to a-fib, pulm HTN noted. 12/2017 EF 55-60%, nl wall motion, grd II DD, mod MR and pulm regurg, increased pulm pressure (peak 52).     OB History   No obstetric history on file.     Family History  Problem Relation Age of Onset  . Cancer Mother   . Heart disease Father   . Early death Father   . Sudden Cardiac Death Neg Hx     Social History   Tobacco Use  . Smoking status:  Former Smoker    Years: 22.00    Quit date: 03/17/1972    Years since quitting: 47.4  . Smokeless tobacco: Never Used  Substance Use Topics  . Alcohol use: Yes    Alcohol/week: 1.0 standard drinks    Types: 1 Glasses of wine per week    Comment: wine  . Drug use: No    Home Medications Prior to Admission medications   Medication Sig Start Date End Date Taking? Authorizing Provider  acetaminophen (TYLENOL) 500 MG tablet  Take 500-1,000 mg 3 (three) times daily as needed by mouth for moderate pain.    Yes [provider]  amiodarone (PACERONE) 200 MG tablet TAKE 1 TABLET BY MOUTH  DAILY Patient taking differently: Take 200 mg by mouth daily.  03/27/19  Yes Lorretta Harp, MD  aspirin EC 325 MG tablet Take 325 mg by mouth every evening.   Yes [provider]  b complex-vitamin c-folic acid (NEPHRO-VITE) 0.8 MG TABS tablet Take 1 tablet by mouth See admin instructions. Take one tablet by mouth on Tuesday, Thursday, Saturday - after dialysis 09/07/18  Yes [provider]  calcitonin, salmon, (MIACALCIN/FORTICAL) 200 UNIT/ACT nasal spray Place 1 spray into alternate nostrils daily.  07/06/19  Yes [provider]  carvedilol (COREG) 3.125 MG tablet TAKE 1 TABLET BY MOUTH TWO  TIMES DAILY Patient taking differently: Take 3.125 mg by mouth as needed (blood pressure).  06/27/18  Yes Lorretta Harp, MD  DENTA 5000 PLUS 1.1 % CREA dental cream Place 1 application onto teeth at bedtime.  04/06/19  Yes [provider]  diclofenac (FLECTOR) 1.3 % PTCH Place 1 patch onto the skin 2 (two) times daily.    Yes [provider]  docusate sodium (COLACE) 100 MG capsule Take 100 mg by mouth daily as needed for mild constipation.    Yes [provider]  doxercalciferol (HECTOROL) 2.5 MCG capsule Take 3 mg by mouth 3 (three) times a week.  02/28/19 02/27/20 Yes [provider]  heparin 1000 unit/mL SOLN injection 1,000 Units by Dialysis  route one time in dialysis.  02/07/19 02/06/20 Yes [provider]  isosorbide mononitrate (IMDUR) 60 MG 24 hr tablet TAKE 1 TABLET BY MOUTH  DAILY ; TAKE 1 TABLET ON  SUN, MON, WED, FRI MORNINGS AND 1 TABLET ON TUES, THU,  SAT AFTER DIALYSIS Patient taking differently: Take 60 mg by mouth daily.  07/21/19  Yes McGowen, Adrian Blackwater, MD  lidocaine-prilocaine (EMLA) cream Apply 1 application topically See admin instructions. Apply small amount to access site (AVF) one hour before dialysis, cover with occlusive dressing (saran wrap) 11/11/18  Yes [provider]  Multiple Vitamins-Minerals (MULTIVITAMIN WITH MINERALS) tablet Take 1 tablet by mouth at bedtime.    Yes [provider]  ondansetron (ZOFRAN) 4 MG tablet Take 1 tablet (4 mg total) by mouth every 8 (eight) hours as needed for nausea or vomiting. 07/12/19  Yes McGowen, Adrian Blackwater, MD  Oxycodone HCl 10 MG TABS 1-2 tabs po q6h prn severe pain Patient taking differently: Take 10-20 mg by mouth every 6 (six) hours as needed (Severe pain). 1-2 tabs po q6h prn severe pain 07/07/19  Yes McGowen, Adrian Blackwater, MD  polyvinyl alcohol (LIQUIFILM TEARS) 1.4 % ophthalmic solution Place 1-2 drops into both eyes daily as needed for dry eyes.    Yes [provider]  pravastatin (PRAVACHOL) 40 MG tablet TAKE 1 TABLET BY MOUTH  EVERY EVENING Patient taking differently: Take 40 mg by mouth at bedtime.  03/09/18  Yes McGowen, Adrian Blackwater, MD  sevelamer carbonate (RENVELA) 800 MG tablet Take 800-2,400 mg by mouth See admin instructions. Take two - three tablets (1600 mg - 2400 mg) by mouth up to three times daily with meals, take one tablet (800 mg) with snacks   Yes [provider]  vitamin C (ASCORBIC ACID) 500 MG tablet Take 500 mg by mouth daily.   Yes [provider]  Vitamin D, Ergocalciferol, (DRISDOL) 1.25 MG (50000 UT) CAPS  capsule Take 1 capsule (50,000 Units total) by mouth every Thursday. 04/28/18  Yes McGowen, Adrian Blackwater,  MD  fentaNYL (DURAGESIC) 50 MCG/HR Place 1 patch onto the skin every 3 (three) days. 08/02/19   McGowen, Adrian Blackwater, MD  naloxone [facility]) 0.4 MG/ML injection Inject IM as needed for reversal of opioid pain medication Patient taking differently: Inject 0.4 mg into the muscle See admin instructions. Inject IM as needed for reversal of opioid pain medication 07/20/19   McGowen, Adrian Blackwater, MD    Allergies    Clonidine derivatives, Adhesive [tape], Codeine, Nickel, and Sulfa antibiotics  Review of Systems   Review of Systems  Constitutional: Negative for chills and fever.  HENT: Negative for congestion.   Eyes: Negative for pain.  Respiratory: Positive for shortness of breath (doe). Negative for cough.   Cardiovascular: Positive for leg swelling. Negative for chest pain.  Gastrointestinal: Negative for abdominal pain, diarrhea, nausea and vomiting.  Genitourinary: Negative for dysuria.  Musculoskeletal: Negative for myalgias.  Skin: Negative for rash.  Neurological: Positive for weakness (generalized ). Negative for dizziness and headaches.    Physical Exam Updated Vital Signs BP (!) 119/46 (BP Location: Left Arm)   Pulse 79   Temp 98.4 F (36.9 C) (Oral)   Resp 20   Ht 4\' 11"  (1.499 m)   Wt 55 kg   LMP  (LMP Unknown)   SpO2 96%   BMI 24.49 kg/m   Physical Exam Vitals and nursing note reviewed.  Constitutional:      General: She is not in acute distress.    Appearance: She is not ill-appearing.     Comments: Patient is pleasant, chronically ill appearing 86 year old female in no acute distress.  Sitting comfortably in bed able answer questions appropriately follow commands.  HENT:     Head: Normocephalic and atraumatic.     Nose: Nose normal.     Mouth/Throat:     Mouth: Mucous membranes are moist.  Eyes:     General: No scleral icterus. Cardiovascular:     Rate and Rhythm: Normal rate. Rhythm irregular.     Pulses: Normal pulses.     Heart sounds: Murmur present.    Pulmonary:     Effort: Pulmonary effort is normal. No respiratory distress.     Breath sounds: No wheezing or rales.  Abdominal:     Palpations: Abdomen is soft.     Tenderness: There is no abdominal tenderness. There is no guarding or rebound.  Musculoskeletal:     Cervical back: Normal range of motion.     Right lower leg: Edema present.     Left lower leg: Edema present.     Comments: 3+ bilateral lower extremity edema at the level of the knee  No calf tenderness.  Skin:    General: Skin is warm and dry.     Capillary Refill: Capillary refill takes less than 2 seconds.  Neurological:     Mental Status: She is alert. Mental status is at baseline.  Psychiatric:        Mood and Affect: Mood normal.        Behavior: Behavior normal.     ED Results / Procedures / Treatments   Labs (all labs ordered are listed, but only abnormal results are displayed) Labs Reviewed  BRAIN NATRIURETIC PEPTIDE - Abnormal; Notable for the following components:      Result Value   B Natriuretic Peptide 922.1 (*)    All other components within normal limits  CBC WITH DIFFERENTIAL/PLATELET - Abnormal; Notable for the following components:   MCV 101.8 (*)    RDW 16.5 (*)    Monocytes Absolute 1.2 (*)    All other components within normal limits  COMPREHENSIVE METABOLIC PANEL - Abnormal; Notable for the following components:   Chloride 92 (*)    Creatinine, Ser 3.57 (*)    Calcium 8.3 (*)    Total Protein 5.8 (*)    Albumin 2.3 (*)    GFR calc non Af Amer 11 (*)    GFR calc Af Amer 13 (*)    Anion gap 18 (*)    All other components within normal limits  CBG MONITORING, ED - Abnormal; Notable for the following components:   Glucose-Capillary 105 (*)    All other components within normal limits    EKG EKG Interpretation  Date/Time:  Friday August 11 2019 08:43:51 EDT Ventricular Rate:  75 PR Interval:    QRS Duration: 108 QT Interval:  426 QTC Calculation: 476 R Axis:   73 Text  Interpretation: Sinus rhythm Short PR interval LAE, consider biatrial enlargement Minimal ST elevation, inferior leads Artifact in lead(s) I II III aVR aVL aVF When compared to prior, similar artifact. NO STEMI Confirmed by Antony Blackbird [phone]) on 08/11/2019 9:39:03 AM   Radiology DG Chest 2 View  Result Date: 08/11/2019 CLINICAL DATA:  Shortness of breath EXAM: CHEST - 2 VIEW COMPARISON:  03/24/2019 FINDINGS: Cardiac shadow is enlarged but stable. Aortic calcifications are seen. Pacing device is again noted and stable. Changes of prior vertebral augmentation at T8 are seen. The lungs are clear. No acute bony abnormality is noted. IMPRESSION: No acute abnormality noted. Electronically Signed   By: Inez Catalina M.D.   On: 08/11/2019 09:25    Procedures Procedures (including critical care time)  Medications Ordered in ED Medications - No data to display  ED Course  I have reviewed the triage vital signs and the nursing notes.  Pertinent labs & imaging results that were available during my care of the patient were reviewed by me and considered in my medical decision making (see chart for details).    MDM Rules/Calculators/A&P                      Patient is a 86 year old female with a past medical history significant for PAF, stroke, CKD 4 on dialysis thrice weekly.  Was recently asked by her nephrologist to begin doing 4 time weekly dialysis.  She has not began this yet.  She is presenting for 1 month of worsening lower extremity edema and dyspnea on exertion.  Daughter is at bedside.  Physical exam notable for absent crackles and SPO2 in the 96-100 range on room air.  She ambulated without difficulty and her oxygen saturations remained 92-93.  She does have significant lower extremity edema that is 3+ to the knees.  There is no evidence of cellulitis and no unilateral swelling to indicate DVT.  Patient does have murmur.  Discussed with daughter this is chronic she has been assessed by  cardiology to diagnose her with aortic stenosis states that it would be observed at this time.  She has not no syncopal episodes.  BNP somewhat elevated at 922 however patient has CKD and suspect that this is likely elevated in part due to low renal clearance.  No leukocytosis or anemia.  CMP is notable for elevated creatinine however this is at baseline for patient.  No acute abnormalities.  EKG  with poor data due to artifact however it is comparatively similar to prior EKGs.  I reviewed this with my attending Dr. Sherry Ruffing.  Chest x-ray shows no pulmonary edema.  This correlates with my exam.  Nephrology consulted. Recommends no initiation of lasix at this time given that she is closely followed by Dr. Lorrene Reid and has not been initiated previously.   I discussed this case with my attending physician who cosigned this note including patient's presenting symptoms, physical exam, and planned diagnostics and interventions. Attending physician stated agreement with plan or made changes to plan which were implemented.   Attending physician assessed patient at bedside.  Discussed plan to discharge with daughter and patient.  They are agreeable to plan at this time.  Will return to ED immediately if new or worsening symptoms or shortness of breath or chest pain or any other symptoms that concern them.  Otherwise they will follow up in dialysis center tomorrow.  Patient is scheduled Monday, Tuesday, Thursday, Saturday.  She states that she will start doing the 4 times weekly dialysis treatments this week.   Final Clinical Impression(s) / ED Diagnoses Final diagnoses:  Peripheral edema    Rx / DC Orders ED Discharge Orders    None       Tedd Sias, [place] 08/11/19 1204    Tegeler, Gwenyth Allegra, MD 08/11/19 [phone]

## 2019-08-11 NOTE — Telephone Encounter (Signed)
Patient was seen in ER with pain in her lower extremities.  Patient would like to speak with a nurse or Dr. Anitra Lauth.  Please call [phone]

## 2019-08-12 DIAGNOSIS — N2581 Secondary hyperparathyroidism of renal origin: Secondary | ICD-10-CM | POA: Diagnosis not present

## 2019-08-12 DIAGNOSIS — R52 Pain, unspecified: Secondary | ICD-10-CM | POA: Diagnosis not present

## 2019-08-12 DIAGNOSIS — N186 End stage renal disease: Secondary | ICD-10-CM | POA: Diagnosis not present

## 2019-08-12 DIAGNOSIS — D509 Iron deficiency anemia, unspecified: Secondary | ICD-10-CM | POA: Diagnosis not present

## 2019-08-12 DIAGNOSIS — E8770 Fluid overload, unspecified: Secondary | ICD-10-CM | POA: Diagnosis not present

## 2019-08-12 DIAGNOSIS — E876 Hypokalemia: Secondary | ICD-10-CM | POA: Diagnosis not present

## 2019-08-12 DIAGNOSIS — D689 Coagulation defect, unspecified: Secondary | ICD-10-CM | POA: Diagnosis not present

## 2019-08-12 DIAGNOSIS — D631 Anemia in chronic kidney disease: Secondary | ICD-10-CM | POA: Diagnosis not present

## 2019-08-12 DIAGNOSIS — Z992 Dependence on renal dialysis: Secondary | ICD-10-CM | POA: Diagnosis not present

## 2019-08-15 DIAGNOSIS — R52 Pain, unspecified: Secondary | ICD-10-CM | POA: Diagnosis not present

## 2019-08-15 DIAGNOSIS — E8770 Fluid overload, unspecified: Secondary | ICD-10-CM | POA: Diagnosis not present

## 2019-08-15 DIAGNOSIS — N2581 Secondary hyperparathyroidism of renal origin: Secondary | ICD-10-CM | POA: Diagnosis not present

## 2019-08-15 DIAGNOSIS — Z992 Dependence on renal dialysis: Secondary | ICD-10-CM | POA: Diagnosis not present

## 2019-08-15 DIAGNOSIS — D689 Coagulation defect, unspecified: Secondary | ICD-10-CM | POA: Diagnosis not present

## 2019-08-15 DIAGNOSIS — D631 Anemia in chronic kidney disease: Secondary | ICD-10-CM | POA: Diagnosis not present

## 2019-08-15 DIAGNOSIS — N186 End stage renal disease: Secondary | ICD-10-CM | POA: Diagnosis not present

## 2019-08-15 DIAGNOSIS — D509 Iron deficiency anemia, unspecified: Secondary | ICD-10-CM | POA: Diagnosis not present

## 2019-08-15 DIAGNOSIS — E876 Hypokalemia: Secondary | ICD-10-CM | POA: Diagnosis not present

## 2019-08-15 NOTE — Telephone Encounter (Signed)
MyChart message read.

## 2019-08-16 ENCOUNTER — Observation Stay (HOSPITAL_COMMUNITY)
Admission: EM | Admit: 2019-08-16 | Discharge: 2019-08-17 | Disposition: A | Discharge status: Home / Self Care | Payer: Medicare Other | Attending: Internal Medicine | Admitting: Internal Medicine

## 2019-08-16 ENCOUNTER — Encounter (HOSPITAL_COMMUNITY): Payer: Self-pay | Admitting: Emergency Medicine

## 2019-08-16 ENCOUNTER — Emergency Department (HOSPITAL_COMMUNITY): Payer: Medicare Other

## 2019-08-16 ENCOUNTER — Other Ambulatory Visit: Payer: Self-pay

## 2019-08-16 DIAGNOSIS — I959 Hypotension, unspecified: Secondary | ICD-10-CM | POA: Diagnosis not present

## 2019-08-16 DIAGNOSIS — N186 End stage renal disease: Secondary | ICD-10-CM

## 2019-08-16 DIAGNOSIS — E785 Hyperlipidemia, unspecified: Secondary | ICD-10-CM | POA: Diagnosis not present

## 2019-08-16 DIAGNOSIS — I5031 Acute diastolic (congestive) heart failure: Secondary | ICD-10-CM | POA: Diagnosis present

## 2019-08-16 DIAGNOSIS — Z87891 Personal history of nicotine dependence: Secondary | ICD-10-CM | POA: Insufficient documentation

## 2019-08-16 DIAGNOSIS — M549 Dorsalgia, unspecified: Secondary | ICD-10-CM | POA: Diagnosis not present

## 2019-08-16 DIAGNOSIS — Z992 Dependence on renal dialysis: Secondary | ICD-10-CM | POA: Diagnosis not present

## 2019-08-16 DIAGNOSIS — I5041 Acute combined systolic (congestive) and diastolic (congestive) heart failure: Secondary | ICD-10-CM | POA: Diagnosis not present

## 2019-08-16 DIAGNOSIS — Z87442 Personal history of urinary calculi: Secondary | ICD-10-CM | POA: Diagnosis not present

## 2019-08-16 DIAGNOSIS — Z9981 Dependence on supplemental oxygen: Secondary | ICD-10-CM | POA: Insufficient documentation

## 2019-08-16 DIAGNOSIS — I110 Hypertensive heart disease with heart failure: Secondary | ICD-10-CM | POA: Diagnosis not present

## 2019-08-16 DIAGNOSIS — G8929 Other chronic pain: Secondary | ICD-10-CM | POA: Diagnosis not present

## 2019-08-16 DIAGNOSIS — M8580 Other specified disorders of bone density and structure, unspecified site: Secondary | ICD-10-CM | POA: Insufficient documentation

## 2019-08-16 DIAGNOSIS — Z8673 Personal history of transient ischemic attack (TIA), and cerebral infarction without residual deficits: Secondary | ICD-10-CM

## 2019-08-16 DIAGNOSIS — Z743 Need for continuous supervision: Secondary | ICD-10-CM | POA: Diagnosis not present

## 2019-08-16 DIAGNOSIS — Z950 Presence of cardiac pacemaker: Secondary | ICD-10-CM | POA: Insufficient documentation

## 2019-08-16 DIAGNOSIS — M542 Cervicalgia: Secondary | ICD-10-CM | POA: Diagnosis not present

## 2019-08-16 DIAGNOSIS — D631 Anemia in chronic kidney disease: Secondary | ICD-10-CM | POA: Insufficient documentation

## 2019-08-16 DIAGNOSIS — M48061 Spinal stenosis, lumbar region without neurogenic claudication: Secondary | ICD-10-CM | POA: Insufficient documentation

## 2019-08-16 DIAGNOSIS — R531 Weakness: Secondary | ICD-10-CM | POA: Diagnosis not present

## 2019-08-16 DIAGNOSIS — Z20822 Contact with and (suspected) exposure to covid-19: Secondary | ICD-10-CM | POA: Insufficient documentation

## 2019-08-16 DIAGNOSIS — J9601 Acute respiratory failure with hypoxia: Secondary | ICD-10-CM | POA: Diagnosis not present

## 2019-08-16 DIAGNOSIS — L8990 Pressure ulcer of unspecified site, unspecified stage: Secondary | ICD-10-CM | POA: Insufficient documentation

## 2019-08-16 DIAGNOSIS — E8770 Fluid overload, unspecified: Secondary | ICD-10-CM | POA: Diagnosis not present

## 2019-08-16 DIAGNOSIS — I5033 Acute on chronic diastolic (congestive) heart failure: Secondary | ICD-10-CM | POA: Diagnosis not present

## 2019-08-16 DIAGNOSIS — I10 Essential (primary) hypertension: Secondary | ICD-10-CM

## 2019-08-16 DIAGNOSIS — R600 Localized edema: Secondary | ICD-10-CM | POA: Insufficient documentation

## 2019-08-16 DIAGNOSIS — R0602 Shortness of breath: Secondary | ICD-10-CM | POA: Diagnosis not present

## 2019-08-16 DIAGNOSIS — Z79899 Other long term (current) drug therapy: Secondary | ICD-10-CM | POA: Insufficient documentation

## 2019-08-16 DIAGNOSIS — Z86010 Personal history of colonic polyps: Secondary | ICD-10-CM | POA: Insufficient documentation

## 2019-08-16 DIAGNOSIS — E8889 Other specified metabolic disorders: Secondary | ICD-10-CM | POA: Diagnosis not present

## 2019-08-16 DIAGNOSIS — I132 Hypertensive heart and chronic kidney disease with heart failure and with stage 5 chronic kidney disease, or end stage renal disease: Secondary | ICD-10-CM | POA: Diagnosis not present

## 2019-08-16 DIAGNOSIS — Z6832 Body mass index (BMI) 32.0-32.9, adult: Secondary | ICD-10-CM | POA: Insufficient documentation

## 2019-08-16 DIAGNOSIS — I5042 Chronic combined systolic (congestive) and diastolic (congestive) heart failure: Secondary | ICD-10-CM | POA: Insufficient documentation

## 2019-08-16 DIAGNOSIS — M5416 Radiculopathy, lumbar region: Secondary | ICD-10-CM | POA: Insufficient documentation

## 2019-08-16 DIAGNOSIS — I120 Hypertensive chronic kidney disease with stage 5 chronic kidney disease or end stage renal disease: Secondary | ICD-10-CM | POA: Diagnosis not present

## 2019-08-16 DIAGNOSIS — N261 Atrophy of kidney (terminal): Secondary | ICD-10-CM | POA: Insufficient documentation

## 2019-08-16 DIAGNOSIS — E669 Obesity, unspecified: Secondary | ICD-10-CM | POA: Insufficient documentation

## 2019-08-16 DIAGNOSIS — I480 Paroxysmal atrial fibrillation: Secondary | ICD-10-CM | POA: Diagnosis present

## 2019-08-16 DIAGNOSIS — R0902 Hypoxemia: Secondary | ICD-10-CM | POA: Diagnosis not present

## 2019-08-16 DIAGNOSIS — R069 Unspecified abnormalities of breathing: Secondary | ICD-10-CM | POA: Diagnosis not present

## 2019-08-16 DIAGNOSIS — I495 Sick sinus syndrome: Secondary | ICD-10-CM | POA: Insufficient documentation

## 2019-08-16 DIAGNOSIS — Z7982 Long term (current) use of aspirin: Secondary | ICD-10-CM | POA: Insufficient documentation

## 2019-08-16 LAB — TROPONIN I (HIGH SENSITIVITY)
Troponin I (High Sensitivity): 34 ng/L — ABNORMAL HIGH (ref <18)
Troponin I (High Sensitivity): 33 ng/L — ABNORMAL HIGH (ref <18)

## 2019-08-16 LAB — RESPIRATORY PANEL BY RT PCR (FLU A&B, COVID)
Influenza A by PCR: NEGATIVE
Influenza B by PCR: NEGATIVE
SARS Coronavirus 2 by RT PCR: NEGATIVE

## 2019-08-16 LAB — CBC WITH DIFFERENTIAL/PLATELET
Abs Immature Granulocytes: 0.05 10*3/uL (ref 0.00–0.07)
Basophils Absolute: 0 10*3/uL (ref 0.0–0.1)
Basophils Relative: 1 %
Eosinophils Absolute: 0.1 10*3/uL (ref 0.0–0.5)
Eosinophils Relative: 1 %
HCT: 40.3 % (ref 36.0–46.0)
Hemoglobin: 12.4 g/dL (ref 12.0–15.0)
Immature Granulocytes: 1 %
Lymphocytes Relative: 15 %
Lymphs Abs: 1 10*3/uL (ref 0.7–4.0)
MCH: 30.7 pg (ref 26.0–34.0)
MCHC: 30.8 g/dL (ref 30.0–36.0)
MCV: 99.8 fL (ref 80.0–100.0)
Monocytes Absolute: 1.2 10*3/uL — ABNORMAL HIGH (ref 0.1–1.0)
Monocytes Relative: 18 %
Neutro Abs: 4.2 10*3/uL (ref 1.7–7.7)
Neutrophils Relative %: 64 %
Platelets: 227 10*3/uL (ref 150–400)
RBC: 4.04 MIL/uL (ref 3.87–5.11)
RDW: 16.6 % — ABNORMAL HIGH (ref 11.5–15.5)
WBC: 6.6 10*3/uL (ref 4.0–10.5)
nRBC: 0 % (ref 0.0–0.2)

## 2019-08-16 LAB — COMPREHENSIVE METABOLIC PANEL
ALT: 12 U/L (ref 0–44)
AST: 24 U/L (ref 15–41)
Albumin: 2.5 g/dL — ABNORMAL LOW (ref 3.5–5.0)
Alkaline Phosphatase: 88 U/L (ref 38–126)
Anion gap: 12 (ref 5–15)
BUN: 9 mg/dL (ref 8–23)
CO2: 29 mmol/L (ref 22–32)
Calcium: 8 mg/dL — ABNORMAL LOW (ref 8.9–10.3)
Chloride: 92 mmol/L — ABNORMAL LOW (ref 98–111)
Creatinine, Ser: 2.96 mg/dL — ABNORMAL HIGH (ref 0.44–1.00)
GFR calc Af Amer: 16 mL/min — ABNORMAL LOW (ref >60)
GFR calc non Af Amer: 14 mL/min — ABNORMAL LOW (ref >60)
Glucose, Bld: 82 mg/dL (ref 70–99)
Potassium: 4.5 mmol/L (ref 3.5–5.1)
Sodium: 133 mmol/L — ABNORMAL LOW (ref 135–145)
Total Bilirubin: 0.3 mg/dL (ref 0.3–1.2)
Total Protein: 6.6 g/dL (ref 6.5–8.1)

## 2019-08-16 LAB — SARS CORONAVIRUS 2 (TAT 6-24 HRS): SARS Coronavirus 2: NEGATIVE

## 2019-08-16 LAB — BRAIN NATRIURETIC PEPTIDE: B Natriuretic Peptide: 1660.5 pg/mL — ABNORMAL HIGH (ref 0.0–100.0)

## 2019-08-16 MED ORDER — ONDANSETRON HCL 4 MG PO TABS: 4.0000 mg | ORAL_TABLET | Freq: Four times a day (QID) | ORAL | Status: DC | PRN

## 2019-08-16 MED ORDER — SEVELAMER CARBONATE 800 MG PO TABS: 800.0000 mg | ORAL_TABLET | ORAL | Status: DC

## 2019-08-16 MED ORDER — DOXERCALCIFEROL 2.5 MCG PO CAPS: 2.5000 ug | ORAL_CAPSULE | ORAL | Status: DC

## 2019-08-16 MED ORDER — CHLORHEXIDINE GLUCONATE CLOTH 2 % EX PADS: 6.0000 | MEDICATED_PAD | Freq: Every day | CUTANEOUS | Status: DC

## 2019-08-16 MED ORDER — HEPARIN SODIUM (PORCINE) 5000 UNIT/ML IJ SOLN
5000.0000 [IU] | Freq: Three times a day (TID) | INTRAMUSCULAR | Status: DC
  Filled 2019-08-16: qty 1

## 2019-08-16 MED ORDER — LIDOCAINE-PRILOCAINE 2.5-2.5 % EX CREA: 1.0000 "application " | TOPICAL_CREAM | CUTANEOUS | Status: DC | PRN

## 2019-08-16 MED ORDER — CAMPHOR-MENTHOL 0.5-0.5 % EX LOTN: 1.0000 "application " | TOPICAL_LOTION | Freq: Three times a day (TID) | CUTANEOUS | Status: DC | PRN

## 2019-08-16 MED ORDER — AMIODARONE HCL 200 MG PO TABS
200.0000 mg | ORAL_TABLET | Freq: Every day | ORAL | Status: DC
  Administered 2019-08-16: 200 mg via ORAL
  Filled 2019-08-16: qty 1

## 2019-08-16 MED ORDER — ZOLPIDEM TARTRATE 5 MG PO TABS
5.0000 mg | ORAL_TABLET | Freq: Every evening | ORAL | Status: DC | PRN
  Administered 2019-08-16: 5 mg via ORAL
  Filled 2019-08-16: qty 1

## 2019-08-16 MED ORDER — ACETAMINOPHEN 325 MG PO TABS
ORAL_TABLET | ORAL | Status: AC
  Filled 2019-08-16: qty 2

## 2019-08-16 MED ORDER — SODIUM CHLORIDE 0.9% FLUSH
3.0000 mL | Freq: Two times a day (BID) | INTRAVENOUS | Status: DC
  Administered 2019-08-16: 3 mL via INTRAVENOUS

## 2019-08-16 MED ORDER — RENA-VITE PO TABS
1.0000 | ORAL_TABLET | Freq: Every day | ORAL | Status: DC
  Administered 2019-08-16: 1 via ORAL
  Filled 2019-08-16: qty 1

## 2019-08-16 MED ORDER — SEVELAMER CARBONATE 800 MG PO TABS
1600.0000 mg | ORAL_TABLET | Freq: Three times a day (TID) | ORAL | Status: DC
  Administered 2019-08-16 – 2019-08-17 (×2): 1600 mg via ORAL
  Filled 2019-08-16 (×2): qty 2

## 2019-08-16 MED ORDER — DOCUSATE SODIUM 283 MG RE ENEM
1.0000 | ENEMA | RECTAL | Status: DC | PRN
  Filled 2019-08-16: qty 1

## 2019-08-16 MED ORDER — ONDANSETRON HCL 4 MG/2ML IJ SOLN: 4.0000 mg | Freq: Four times a day (QID) | INTRAMUSCULAR | Status: DC | PRN

## 2019-08-16 MED ORDER — PRAVASTATIN SODIUM 40 MG PO TABS
40.0000 mg | ORAL_TABLET | Freq: Every day | ORAL | Status: DC
  Administered 2019-08-16: 40 mg via ORAL
  Filled 2019-08-16: qty 1

## 2019-08-16 MED ORDER — CALCITONIN (SALMON) 200 UNIT/ACT NA SOLN
1.0000 | Freq: Every day | NASAL | Status: DC
  Filled 2019-08-16: qty 3.7

## 2019-08-16 MED ORDER — NEPRO/CARBSTEADY PO LIQD: 237.0000 mL | Freq: Three times a day (TID) | ORAL | Status: DC | PRN

## 2019-08-16 MED ORDER — DOXERCALCIFEROL 4 MCG/2ML IV SOLN
3.0000 ug | INTRAVENOUS | Status: DC
  Filled 2019-08-16: qty 2

## 2019-08-16 MED ORDER — ISOSORBIDE MONONITRATE ER 60 MG PO TB24
60.0000 mg | ORAL_TABLET | Freq: Every day | ORAL | Status: DC
  Administered 2019-08-16: 60 mg via ORAL
  Filled 2019-08-16: qty 1

## 2019-08-16 MED ORDER — ACETAMINOPHEN 325 MG PO TABS
650.0000 mg | ORAL_TABLET | Freq: Four times a day (QID) | ORAL | Status: DC | PRN
  Administered 2019-08-16: 650 mg via ORAL

## 2019-08-16 MED ORDER — POLYVINYL ALCOHOL 1.4 % OP SOLN
1.0000 [drp] | Freq: Every day | OPHTHALMIC | Status: DC | PRN
  Filled 2019-08-16: qty 15

## 2019-08-16 MED ORDER — HYDROXYZINE HCL 25 MG PO TABS: 25.0000 mg | ORAL_TABLET | Freq: Three times a day (TID) | ORAL | Status: DC | PRN

## 2019-08-16 MED ORDER — ACETAMINOPHEN 650 MG RE SUPP: 650.0000 mg | Freq: Four times a day (QID) | RECTAL | Status: DC | PRN

## 2019-08-16 MED ORDER — CALCIUM CARBONATE ANTACID 1250 MG/5ML PO SUSP
500.0000 mg | Freq: Four times a day (QID) | ORAL | Status: DC | PRN
  Filled 2019-08-16: qty 5

## 2019-08-16 MED ORDER — FENTANYL 50 MCG/HR TD PT72
1.0000 | MEDICATED_PATCH | TRANSDERMAL | Status: DC
  Administered 2019-08-16: 1 via TRANSDERMAL
  Filled 2019-08-16: qty 1

## 2019-08-16 MED ORDER — OXYCODONE HCL 5 MG PO TABS: 10.0000 mg | ORAL_TABLET | Freq: Four times a day (QID) | ORAL | Status: DC | PRN

## 2019-08-16 MED ORDER — SORBITOL 70 % SOLN: 30.0000 mL | Status: DC | PRN

## 2019-08-16 MED ORDER — ASPIRIN EC 325 MG PO TBEC
325.0000 mg | DELAYED_RELEASE_TABLET | Freq: Every evening | ORAL | Status: DC
  Filled 2019-08-16: qty 1

## 2019-08-16 NOTE — H&P (Addendum)
History and Physical    Valerie Terrell DOB: 1932/07/19 DOA: 08/16/2019  PCP: Tammi Sou, MD Consultants:  Berry/Taylor - cardiology; Lorrene Reid - nephrology; Nelva Bush - orthopedics Patient coming from:  Home - lives with daughter; [facility]: Daughter, [phone]  Chief Complaint: SOB  HPI: Valerie Terrell is a 86 y.o. female with medical history significant of chronic back pain; CVA; pacemaker placement; afib, not on AC due to falls; HTN; HLD; ESRD on HD; and chronic combined CHF presenting with SOB.  She has had progressive LE edema for 2-3 weeks.  Her right leg is weeping fluids.  They met with Dr. Lorrene Reid and the plan was to increase HD to 4 times a week.  She has been weak and SOB.  Hypoxia to 88% with sleep here.  They were here last Friday with volume overload and discharged.  They return with the same concern.  She had HD yesterday but continues to be volume overload.  She has problems with low BP during HD.  Also with chronic back pain, hard to sit for prolonged HD sessions.     ED Course:  On HD, bad peripheral edema, ?mild pulmonary edema.  Hypoxia to 75.  Nephrology to increase HD to 4 times a week.  Will consult nephrology.  Comfortable on RA at rest.  Review of Systems: As per HPI; otherwise review of systems reviewed and negative.   Ambulatory Status:  Non-ambulatory out of house or with a walker at times  COVID Vaccine Status:   Complete  Past Medical History:  Diagnosis Date  . Anemia of chronic disease 2018   + anemia of CRI?  [name] Atrophy of left kidney    with absent blood flow by renal artery dopplers (Dr. Gwenlyn Found)  . 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 Chronic combined systolic and diastolic CHF (congestive heart failure) ([place])    a. 11/2016: echo showing EF of 35-40%, RV strain noted, mild MR and mild TR.   Echo showed much improved EF 12/2017 (55-60%)  . Chronic respiratory failure with hypoxia ([place]) 02/11/2017   Now on chronic O2 (intermittent  as of summer 2019).    . Closed fracture of right superior pubic ramus ([place]) 07/18/2018   Non op mgmt (Dr. Victorino December was ortho who saw her in hosp but i don't think pt followed up with him).  . Closed right hip fracture ([place]) 10/04/2018   ORIF  . Debilitated patient    WC dependent as of 2018.  Hosp bed + full assistance with most ADLs s/p hip fx surg 09/2018.  [name] ESRD on hemodialysis ([place]) 01/2017   T/Th/Sat schedule- [place].  Right basilic AV fistula 42/7062.  Graft 2019.  [name] History of adenomatous polyp of colon   . History of kidney stones   . History of subarachnoid hemorrhage 10/2014   after syncope and while on xarelto  . Hyperlipidemia   . Hypertension    Difficult to control, in the setting of one functioning kidney: pt was referred to nephrology by Dr. Gwenlyn Found 06/2015.  . Lumbar radiculopathy 2012  . Lumbar spinal stenosis 2019   facet injections only very short term relief.  L1 and L2 selective nerve root blocks helpful 04/2018.  . Lumbar spondylosis    MR 07/2016---no sign of spinal nerve compression or cord compression.  Pt set up with outpt ortho while admitted to hosp 07/2016.  . Malnourished ([place])   . Metatarsal fracture 06/10/2016   Nondisplaced, left 5th metatarsal--pt  was referred to ortho  . Osteopenia 2014   T-score -2.1  . PAF (paroxysmal atrial fibrillation) (HCC)    Eliquis started after BRAO and CVA.  She was changed to warfarin 2018. All anticoag stopped due to recurrent falls.  . Presence of permanent cardiac pacemaker   . Right rib fracture 12/2016   s/p fall  . Sick sinus syndrome ([place]) 11/2016   Dual chamber pacer insertion 2018 (Dr. Lovena Le)  . Stroke ([place]) 91/5056   cardioembolic (had CVA while on no anticoag)--"scattered subacute punctate infarcts: 1 in R parietal lobe and 2 in occipital cortex" on MRI br.  CT angio head/neck: aortic arch athero.  R ICA 20% stenosis, L ICA w/out any stenosis.  . Thoracic back pain 01/2017; 01/2018   2018: Facet?   Dr. Ramos->steroid injection.  01/2018-->CT T spine to check for a new comp fx-->none found.  Selective nerve root inj in T spine helpful on R side.   . Thoracic compression fracture ([place]) 07/2016   T3.  T7 and T8-- T8 kyphoplasty during hosp admission 07/2016.  Neuro referred pt to pain mgmt for consideration of injection 09/2016.  I referred her to endo 08/2016 for consideration of calcitonin treatment.    Past Surgical History:  Procedure Laterality Date  . APPENDECTOMY  child  . AV FISTULA PLACEMENT Right 03/31/2017   Procedure: Right ARM Basilic vein transposition;  Surgeon: Conrad [place], MD;  Location: [facility];  Service: Vascular;  Laterality: Right;  . AV FISTULA PLACEMENT Right 12/06/2017   Procedure: INSERTION OF ARTERIOVENOUS (AV) GORE-TEX GRAFT ARM RIGHT UPPER EXTREMITY;  Surgeon: [name], MD;  Location: [place];  Service: Vascular;  Laterality: Right;  . CARDIOVASCULAR STRESS TEST  01/31/2016   Stress myoview: NORMAL/Low risk.  EF 56%.  . [place]  . COLONOSCOPY  2015   + hx of adenomatous polyps.  Need digest health spec in [place] records to see when pt due for next colonoscopy  . DECLOT CKV Right 03/21/2019  . EYE SURGERY Bilateral    Catarct  . INSERTION OF DIALYSIS CATHETER Right 01/29/2017   Procedure: INSERTION OF DIALYSIS CATHETER- RIGHT INTERNAL JUGULAR;  Surgeon: [name], MD;  Location: [facility];  Service: Vascular;  Laterality: Right;  . INTRAMEDULLARY (IM) NAIL INTERTROCHANTERIC Right 10/04/2018   Procedure: INTRAMEDULLARY (IM) NAIL INTERTROCHANTRIC;  Surgeon: Nicholes Stairs, MD;  Location: [facility];  Service: Orthopedics;  Laterality: Right;  . IR GENERIC HISTORICAL  07/24/2016   IR KYPHO THORACIC WITH BONE BIOPSY 07/24/2016 Luanne Bras, MD [facility]-INTER[facility] RAD  . KYPHOPLASTY  07/27/2016   T8  . PACEMAKER IMPLANT N/A 12/03/2016   Procedure: Pacemaker Implant;  Surgeon: Evans Lance, MD;  Location: [facility] CV LAB;  Service: Cardiovascular;   Laterality: N/A;  . Renal artery dopplers  02/26/2016   Her right renal dimension was 11 cm pole to pole with mild to moderate right renal artery stenosis. A right renal aortic ratio was 3.22 suggesting less than a 50% stenosis.  . Right upper arm AV Gore-Tex graft  12/06/2017   Dr. Donnetta Hutching  . TONSILLECTOMY    . TRANSTHORACIC ECHOCARDIOGRAM  01/29/2016; 11/2016; 12/2017   2017: EF 55-60%, normal LV wall motion, grade I DD.  No cardiac source of emboli was seen.  2018: EF 35-40%, could not assess DD due to a-fib, pulm HTN noted. 12/2017 EF 55-60%, nl wall motion, grd II DD, mod MR and pulm regurg, increased pulm pressure (peak 52).  Social History   Socioeconomic History  . Marital status: Widowed    Spouse name: Not on file  . Number of children: Not on file  . Years of education: Not on file  . Highest education level: Not on file  Occupational History  . Not on file  Tobacco Use  . Smoking status: Former Smoker    Years: 22.00    Quit date: 03/17/1972    Years since quitting: 47.4  . Smokeless tobacco: Never Used  Substance and Sexual Activity  . Alcohol use: Yes    Alcohol/week: 1.0 standard drinks    Types: 1 Glasses of wine per week    Comment: wine  . Drug use: No  . Sexual activity: Not Currently  Other Topics Concern  . Not on file  Social History Narrative   Widow.  One daughter, lives with her.   Educ: college   Occup: retired Marine scientist.   No T/A/Ds.   She is almost a vegetarian.   Social Determinants of Health   Financial Resource Strain:   . Difficulty of Paying Living Expenses:   Food Insecurity:   . Worried About Charity fundraiser in the Last Year:   . Arboriculturist in the Last Year:   Transportation Needs:   . Film/video editor (Medical):   [name] Lack of Transportation (Non-Medical):   Physical Activity:   . Days of Exercise per Week:   . Minutes of Exercise per Session:   Stress:   . Feeling of Stress :   Social Connections:   . Frequency of  Communication with Friends and Family:   . Frequency of Social Gatherings with Friends and Family:   . Attends Religious Services:   . Active Member of Clubs or Organizations:   . Attends Archivist Meetings:   [name] Marital Status:   Intimate Partner Violence:   . Fear of Current or Ex-Partner:   . Emotionally Abused:   [name] Physically Abused:   . Sexually Abused:     Allergies  Allergen Reactions  . Clonidine Derivatives Palpitations and Other (See Comments)    VERY SEDATED  . Adhesive [Tape] Other (See Comments)    Tears skin up  . Codeine Nausea Only  . Nickel Rash  . Sulfa Antibiotics Other (See Comments)    Reaction unknown Did not feel well when taken Reaction unknown    Family History  Problem Relation Age of Onset  . Cancer Mother   . Heart disease Father   . Early death Father   . Sudden Cardiac Death Neg Hx     Prior to Admission medications   Medication Sig Start Date End Date Taking? Authorizing Provider  acetaminophen (TYLENOL) 500 MG tablet Take 500-1,000 mg 3 (three) times daily as needed by mouth for moderate pain.    Yes [provider]  amiodarone (PACERONE) 200 MG tablet TAKE 1 TABLET BY MOUTH  DAILY Patient taking differently: Take 200 mg by mouth daily.  03/27/19  Yes Lorretta Harp, MD  aspirin EC 325 MG tablet Take 325 mg by mouth every evening.   Yes [provider]  b complex-vitamin c-folic acid (NEPHRO-VITE) 0.8 MG TABS tablet Take 1 tablet by mouth See admin instructions. Take one tablet by mouth on Tuesday, Thursday, Saturday - after dialysis 09/07/18  Yes [provider]  calcitonin, salmon, (MIACALCIN/FORTICAL) 200 UNIT/ACT nasal spray Place 1 spray into alternate nostrils daily.  07/06/19  Yes [provider]  carvedilol (  COREG) 3.125 MG tablet TAKE 1 TABLET BY MOUTH TWO  TIMES DAILY Patient taking differently: Take 3.125 mg by mouth as needed (blood pressure).  06/27/18  Yes Lorretta Harp, MD    DENTA 5000 PLUS 1.1 % CREA dental cream Place 1 application onto teeth at bedtime.  04/06/19  Yes [provider]  docusate sodium (COLACE) 100 MG capsule Take 100 mg by mouth daily as needed for mild constipation.    Yes [provider]  doxercalciferol (HECTOROL) 2.5 MCG capsule Take 3 mg by mouth 3 (three) times a week.  02/28/19 02/27/20 Yes [provider]  fentaNYL (DURAGESIC) 50 MCG/HR Place 1 patch onto the skin every 3 (three) days. 08/02/19  Yes McGowen, Adrian Blackwater, MD  isosorbide mononitrate (IMDUR) 60 MG 24 hr tablet TAKE 1 TABLET BY MOUTH  DAILY ; TAKE 1 TABLET ON  SUN, MON, WED, FRI MORNINGS AND 1 TABLET ON TUES, THU,  SAT AFTER DIALYSIS Patient taking differently: Take 60 mg by mouth daily.  07/21/19  Yes McGowen, Adrian Blackwater, MD  Lidocaine 4 % PTCH Apply 1 patch topically daily as needed (Mild pain).   Yes [provider]  lidocaine-prilocaine (EMLA) cream Apply 1 application topically See admin instructions. Apply small amount to access site (AVF) one hour before dialysis, cover with occlusive dressing (saran wrap) 11/11/18  Yes [provider]  Multiple Vitamins-Minerals (MULTIVITAMIN WITH MINERALS) tablet Take 1 tablet by mouth at bedtime.    Yes [provider]  naloxone (NARCAN) 0.4 MG/ML injection Inject IM as needed for reversal of opioid pain medication Patient taking differently: Inject 0.4 mg into the muscle See admin instructions. Inject IM as needed for reversal of opioid pain medication 07/20/19  Yes McGowen, Adrian Blackwater, MD  ondansetron (ZOFRAN) 4 MG tablet Take 1 tablet (4 mg total) by mouth every 8 (eight) hours as needed for nausea or vomiting. 07/12/19  Yes McGowen, Adrian Blackwater, MD  Oxycodone HCl 10 MG TABS 1-2 tabs po q6h prn severe pain Patient taking differently: Take 10-20 mg by mouth every 6 (six) hours as needed (Severe pain). 1-2 tabs po q6h prn severe pain 07/07/19  Yes McGowen, Adrian Blackwater, MD  polyvinyl alcohol (LIQUIFILM  TEARS) 1.4 % ophthalmic solution Place 1-2 drops into both eyes daily as needed for dry eyes.    Yes [provider]  pravastatin (PRAVACHOL) 40 MG tablet TAKE 1 TABLET BY MOUTH  EVERY EVENING Patient taking differently: Take 40 mg by mouth at bedtime.  03/09/18  Yes McGowen, Adrian Blackwater, MD  sevelamer carbonate (RENVELA) 800 MG tablet Take 800-2,400 mg by mouth See admin instructions. Take two - three tablets (1600 mg - 2400 mg) by mouth up to three times daily with meals, take one tablet (800 mg) with snacks   Yes [provider]  vitamin C (ASCORBIC ACID) 500 MG tablet Take 500 mg by mouth daily.   Yes [provider]  Vitamin D, Ergocalciferol, (DRISDOL) 1.25 MG (50000 UT) CAPS capsule Take 1 capsule (50,000 Units total) by mouth every Thursday. 04/28/18  Yes McGowen, Adrian Blackwater, MD  heparin 1000 unit/mL SOLN injection 1,000 Units by Dialysis route one time in dialysis.  02/07/19 02/06/20  [provider]    Physical Exam: Vitals:   08/16/19 0730 08/16/19 0800 08/16/19 0900 08/16/19 1527  BP: (!) 139/52 (!) 125/48 (!) 118/48 (!) 137/46  Pulse: 78 74 79 71  Resp: 16   16  Temp: 98.1 F (36.7 C)  98.1 F (36.7 C)  TempSrc: Oral   Oral  SpO2: 98% 92% (!) 89% 98%  Weight:      Height:         . General:  Appears calm and comfortable and is NAD . Eyes:  PERRL, EOMI, normal lids, iris . ENT:  grossly normal hearing, lips & tongue, mmm . Neck:  no LAD, masses or thyromegaly . Cardiovascular:  RRR, no m/r/g. 2-3+ LE edema.  [name] Respiratory:   CTA bilaterally with no wheezes/rales/rhonchi.  Normal respiratory effort. . Abdomen:  soft, NT, ND, NABS . Skin:  Stasis dermatitis on the RLE with mild weeping/ulceration . Musculoskeletal:  grossly normal tone BUE, decreased tone of the BLE, good ROM, no bony abnormality . Psychiatric:  grossly normal mood and affect, speech fluent and appropriate, AOx3 . Neurologic:  CN 2-12 grossly intact, moves all extremities in  coordinated fashion    Radiological Exams on Admission: DG Chest 2 View  Result Date: 08/16/2019 CLINICAL DATA:  Shortness of breath EXAM: CHEST - 2 VIEW COMPARISON:  08/11/2019 FINDINGS: The heart is enlarged but stable. Stable tortuosity and calcification of the thoracic aorta. The pacer wires are stable. Low lung volumes with vascular crowding and streaky basilar atelectasis. No focal infiltrates or pulmonary edema. Stable scoliosis and evidence of prior thoracic vertebral augmentation. IMPRESSION: Low lung volumes with vascular crowding and streaky basilar atelectasis. No infiltrates or edema. Electronically Signed   By: Marijo Sanes M.D.   On: 08/16/2019 05:37    EKG: Independently reviewed.  NSR with rate 79; nonspecific ST changes with no evidence of acute ischemia   Labs on Admission: I have personally reviewed the available labs and imaging studies at the time of the admission.  Pertinent labs:   BUN 9/Creatinine 2.96/GFR 14 Albumin 2.5 BNP 1660.5; prior 922.1 HS troponin 34, 33 Unremarkable CBC Respiratory panel PCR negative   Assessment/Plan Principal Problem:   Volume overload Active Problems:   PAF (paroxysmal atrial fibrillation) (HCC)   Essential hypertension   Dyslipidemia   History of CVA (cerebrovascular accident)   Acute combined systolic and diastolic heart failure (HCC)    ESRD (end stage renal disease) on dialysis (HCC)   Chronic back pain  Volume overload in an ESRD on HD patient -Patient has chronic pain and so difficulty sitting in the chair and also recurrent hypotension -As such, it is difficult for them to draw enough fluid off her -Nephrology has planned to arrange for 4 times weekly HD but has been unable to arrange a chair for her on the extra day to date -Since she is dialysis-dependent, she was unable to clear the excess fluid -She is likely to benefit from serial HD both today and tomorrow and may be appropriate for d/c to home tomorrow  after HD -Nephrology prn order set utilized -Nephrology is aware that patient will need HD  Chronic back pain -I have reviewed this patient in the [place] Controlled Substances Reporting System.  she is receiving medications from only one provider and appears to be taking them as prescribed. -She is not at particularly high risk of opioid misuse, diversion, or overdose. -Continue Fentanyl patch and prn oxycodone without escalation of pain control  Afib, not on AC -Has pacemaker -Rate control with amiodarone -not on AC due to falls  H/o CVA -Continue ASA, Imdur  HTN -Takes Coreg prn but has not needed recently  HLD -Continue Pravachol  Chronic combined CHF -7/19 echo with preserved EF and grade 2 diastolic  dysfunction -Volume control with HD  Obesity Body mass index is 37.2 kg/m.  -BMI 33.5 -Weight loss should be encouraged   Note: This patient has been tested and is negative for the novel coronavirus COVID-19.  DVT prophylaxis: Heparin Code Status:  Full - confirmed with patient/family Family Communication: Her daughter was present throughout the evaluation Disposition Plan: She is anticipated to d/c to home without [facility] services once her nephrology issues have been resolved, possibly as early as tomorrow (4/1) after HD Consults called: Nephrology Admission status: It is my clinical opinion that referral for OBSERVATION is reasonable and necessary in this patient based on the above information provided. The aforementioned taken together are felt to place the patient at high risk for further clinical deterioration. However it is anticipated that the patient may be medically stable for discharge from the hospital within 24 to 48 hours.    Karmen Bongo MD [facility]   How to contact the [facility] Attending or Consulting provider [place] or covering provider during after hours [place], for this patient?  1. Check the care team in [facility] and look for a) attending/consulting TR[facility]  provider listed and b) the [facility] team listed 2. Log into [email or web address] and use [place]'s universal password to access. If you do not have the password, please contact the hospital operator. 3. Locate the [facility] provider you are looking for under [facility] and page to a number that you can be directly reached. 4. If you still have difficulty reaching the provider, please page the [facility] (Director on Call) for the Hospitalists listed on amion for assistance.   08/16/2019, 6:15 PM

## 2019-08-16 NOTE — ED Triage Notes (Signed)
Patient arrived with EMS from home woke up at 2 am this morning with SOB , denies chest pain , no cough or fever . Hemodialysis q Tues/Thurs/Sat .

## 2019-08-16 NOTE — Consult Note (Signed)
[facility] Renal Consultation Note    Indication for Consultation:  Management of ESRD/hemodialysis; anemia, hypertension/volume and secondary hyperparathyroidism  HPI: Valerie Terrell is a 86 y.o. female with ESRD on HD, HTN, COPD, Hx sick sinus syndrome (s/p pacemaker), dHF, Hx SAH, Hx spinal compression Fx, paroxysmal AFib.  She is admitted with dyspnea/volume overload. She has significant lower extremity edema. This has been more progressive lately. She was in the ED Friday with similar presentation and released as she was expected to have dialysis on Monday.   She has had difficulty reaching her dry weight at dialysis d/t hypotension with increased UF goals. She also has back pain that makes it difficult for her to sit for full treatments. The plan was for her to attend dialysis 4x/week. Unfortunately, the unit has not been able to schedule a chair time for the 4th day d/t current patient census/staffing. Disused with clinic manger today - he is working to remedy the issue, but as of today does not have an available time for a 4th day.   Seen and examined in ED. Daughter at bedside. They are frustrated by the entire situation. Last dialyzed Tuesday, left 3 kgs over dry weight She then woke up with SOB and presented to the ED. No overt edema/infiltrates on CXR. COVID test pending. At present sats 93% on RA, breathing comfortable. Becomes SOB with minimal exertion. Denies fevers, chills, CP, N/V,D abd pain.   Past Medical History:  Diagnosis Date  . Anemia of chronic disease 2018   + anemia of CRI?  [name] Atrophy of left kidney    with absent blood flow by renal artery dopplers (Dr. Gwenlyn Found)  . 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 Chronic combined systolic and diastolic CHF (congestive heart failure) ([place])    a. 11/2016: echo showing EF of 35-40%, RV strain noted, mild MR and mild TR.   Echo showed much improved EF 12/2017 (55-60%)  . Chronic respiratory failure with  hypoxia ([place]) 02/11/2017   Now on chronic O2 (intermittent as of summer 2019).    . Closed fracture of right superior pubic ramus ([place]) 07/18/2018   Non op mgmt (Dr. Victorino December was ortho who saw her in hosp but i don't think pt followed up with him).  . Closed right hip fracture ([place]) 10/04/2018   ORIF  . Debilitated patient    WC dependent as of 2018.  Hosp bed + full assistance with most ADLs s/p hip fx surg 09/2018.  [name] ESRD on hemodialysis ([place]) 01/2017   T/Th/Sat schedule- [place].  Right basilic AV fistula 84/1324.  Graft 2019.  [name] History of adenomatous polyp of colon   . History of kidney stones   . History of subarachnoid hemorrhage 10/2014   after syncope and while on xarelto  . Hyperlipidemia   . Hypertension    Difficult to control, in the setting of one functioning kidney: pt was referred to nephrology by Dr. Gwenlyn Found 06/2015.  . Lumbar radiculopathy 2012  . Lumbar spinal stenosis 2019   facet injections only very short term relief.  L1 and L2 selective nerve root blocks helpful 04/2018.  . Lumbar spondylosis    MR 07/2016---no sign of spinal nerve compression or cord compression.  Pt set up with outpt ortho while admitted to hosp 07/2016.  . Malnourished ([place])   . Metatarsal fracture 06/10/2016   Nondisplaced, left 5th metatarsal--pt was referred to ortho  . Osteopenia 2014   T-score -2.1  .  PAF (paroxysmal atrial fibrillation) (HCC)    Eliquis started after BRAO and CVA.  She was changed to warfarin 2018. All anticoag stopped due to recurrent falls.  . Presence of permanent cardiac pacemaker   . Right rib fracture 12/2016   s/p fall  . Sick sinus syndrome ([place]) 11/2016   Dual chamber pacer insertion 2018 (Dr. Lovena Le)  . Stroke ([place]) 75/9163   cardioembolic (had CVA while on no anticoag)--"scattered subacute punctate infarcts: 1 in R parietal lobe and 2 in occipital cortex" on MRI br.  CT angio head/neck: aortic arch athero.  R ICA 20% stenosis, L ICA w/out any  stenosis.  . Thoracic back pain 01/2017; 01/2018   2018: Facet?  Dr. Ramos->steroid injection.  01/2018-->CT T spine to check for a new comp fx-->none found.  Selective nerve root inj in T spine helpful on R side.   . Thoracic compression fracture ([place]) 07/2016   T3.  T7 and T8-- T8 kyphoplasty during hosp admission 07/2016.  Neuro referred pt to pain mgmt for consideration of injection 09/2016.  I referred her to endo 08/2016 for consideration of calcitonin treatment.   Past Surgical History:  Procedure Laterality Date  . APPENDECTOMY  child  . AV FISTULA PLACEMENT Right 03/31/2017   Procedure: Right ARM Basilic vein transposition;  Surgeon: Conrad Brookmont, MD;  Location: [facility] OR;  Service: Vascular;  Laterality: Right;  . AV FISTULA PLACEMENT Right 12/06/2017   Procedure: INSERTION OF ARTERIOVENOUS (AV) GORE-TEX GRAFT ARM RIGHT UPPER EXTREMITY;  Surgeon: [name], MD;  Location: [facility];  Service: Vascular;  Laterality: Right;  . CARDIOVASCULAR STRESS TEST  01/31/2016   Stress myoview: NORMAL/Low risk.  EF 56%.  . [place]  . COLONOSCOPY  2015   + hx of adenomatous polyps.  Need digest health spec in [place] records to see when pt due for next colonoscopy  . DECLOT CKV Right 03/21/2019  . EYE SURGERY Bilateral    Catarct  . INSERTION OF DIALYSIS CATHETER Right 01/29/2017   Procedure: INSERTION OF DIALYSIS CATHETER- RIGHT INTERNAL JUGULAR;  Surgeon: [name], MD;  Location: [facility];  Service: Vascular;  Laterality: Right;  . INTRAMEDULLARY (IM) NAIL INTERTROCHANTERIC Right 10/04/2018   Procedure: INTRAMEDULLARY (IM) NAIL INTERTROCHANTRIC;  Surgeon: Nicholes Stairs, MD;  Location: [place] [facility];  Service: Orthopedics;  Laterality: Right;  . IR GENERIC HISTORICAL  07/24/2016   IR KYPHO THORACIC WITH BONE BIOPSY 07/24/2016 Luanne Bras, MD [facility]-INTER[facility] RAD  . KYPHOPLASTY  07/27/2016   T8  . PACEMAKER IMPLANT N/A 12/03/2016   Procedure: Pacemaker Implant;  Surgeon: Evans Lance, MD;   Location: [name] CV LAB;  Service: Cardiovascular;  Laterality: N/A;  . Renal artery dopplers  02/26/2016   Her right renal dimension was 11 cm pole to pole with mild to moderate right renal artery stenosis. A right renal aortic ratio was 3.22 suggesting less than a 50% stenosis.  . Right upper arm AV Gore-Tex graft  12/06/2017   Dr. Donnetta Hutching  . TONSILLECTOMY    . TRANSTHORACIC ECHOCARDIOGRAM  01/29/2016; 11/2016; 12/2017   2017: EF 55-60%, normal LV wall motion, grade I DD.  No cardiac source of emboli was seen.  2018: EF 35-40%, could not assess DD due to a-fib, pulm HTN noted. 12/2017 EF 55-60%, nl wall motion, grd II DD, mod MR and pulm regurg, increased pulm pressure (peak 52).   Family History  Problem Relation Age of Onset  . Cancer Mother   .  Heart disease Father   . Early death Father   . Sudden Cardiac Death Neg Hx    Social History:  reports that she quit smoking about 47 years ago. She quit after 22.00 years of use. She has never used smokeless tobacco. She reports current alcohol use of about 1.0 standard drinks of alcohol per week. She reports that she does not use drugs. Allergies  Allergen Reactions  . Clonidine Derivatives Palpitations and Other (See Comments)    VERY SEDATED  . Adhesive [Tape] Other (See Comments)    Tears skin up  . Codeine Nausea Only  . Nickel Rash  . Sulfa Antibiotics Other (See Comments)    Reaction unknown Did not feel well when taken Reaction unknown   Prior to Admission medications   Medication Sig Start Date End Date Taking? Authorizing Provider  acetaminophen (TYLENOL) 500 MG tablet Take 500-1,000 mg 3 (three) times daily as needed by mouth for moderate pain.    Yes [provider]  amiodarone (PACERONE) 200 MG tablet TAKE 1 TABLET BY MOUTH  DAILY Patient taking differently: Take 200 mg by mouth daily.  03/27/19  Yes Lorretta Harp, MD  aspirin EC 325 MG tablet Take 325 mg by mouth every evening.   Yes [provider]  b complex-vitamin c-folic acid (NEPHRO-VITE) 0.8 MG TABS tablet Take 1 tablet by mouth See admin instructions. Take one tablet by mouth on Tuesday, Thursday, Saturday - after dialysis 09/07/18  Yes [provider]  calcitonin, salmon, (MIACALCIN/FORTICAL) 200 UNIT/ACT nasal spray Place 1 spray into alternate nostrils daily.  07/06/19  Yes [provider]  carvedilol (COREG) 3.125 MG tablet TAKE 1 TABLET BY MOUTH TWO  TIMES DAILY Patient taking differently: Take 3.125 mg by mouth as needed (blood pressure).  06/27/18  Yes Lorretta Harp, MD  DENTA 5000 PLUS 1.1 % CREA dental cream Place 1 application onto teeth at bedtime.  04/06/19  Yes [provider]  docusate sodium (COLACE) 100 MG capsule Take 100 mg by mouth daily as needed for mild constipation.    Yes [provider]  doxercalciferol (HECTOROL) 2.5 MCG capsule Take 2.5 mcg by mouth 3 (three) times a week.  02/28/19 02/27/20 Yes [provider]  fentaNYL (DURAGESIC) 50 MCG/HR Place 1 patch onto the skin every 3 (three) days. 08/02/19  Yes McGowen, Adrian Blackwater, MD  isosorbide mononitrate (IMDUR) 60 MG 24 hr tablet TAKE 1 TABLET BY MOUTH  DAILY ; TAKE 1 TABLET ON  SUN, MON, WED, FRI MORNINGS AND 1 TABLET ON TUES, THU,  SAT AFTER DIALYSIS Patient taking differently: Take 60 mg by mouth daily.  07/21/19  Yes McGowen, Adrian Blackwater, MD  Lidocaine 4 % PTCH Apply 1 patch topically daily as needed (Mild pain).   Yes [provider]  lidocaine-prilocaine (EMLA) cream Apply 1 application topically See admin instructions. Apply small amount to access site (AVF) one hour before dialysis, cover with occlusive dressing (saran wrap) 11/11/18  Yes [provider]  Multiple Vitamins-Minerals (MULTIVITAMIN WITH MINERALS) tablet Take 1 tablet by mouth at bedtime.    Yes [provider]  naloxone (NARCAN) 0.4 MG/ML injection Inject IM as needed for reversal of opioid pain medication Patient taking  differently: Inject 0.4 mg into the muscle See admin instructions. Inject IM as needed for reversal of opioid pain medication 07/20/19  Yes McGowen, Adrian Blackwater, MD  ondansetron (ZOFRAN) 4 MG tablet Take 1 tablet (4 mg total) by mouth every 8 (eight) hours  as needed for nausea or vomiting. 07/12/19  Yes McGowen, Adrian Blackwater, MD  Oxycodone HCl 10 MG TABS 1-2 tabs po q6h prn severe pain Patient taking differently: Take 10-20 mg by mouth every 6 (six) hours as needed (Severe pain). 1-2 tabs po q6h prn severe pain 07/07/19  Yes McGowen, Adrian Blackwater, MD  polyvinyl alcohol (LIQUIFILM TEARS) 1.4 % ophthalmic solution Place 1-2 drops into both eyes daily as needed for dry eyes.    Yes [provider]  pravastatin (PRAVACHOL) 40 MG tablet TAKE 1 TABLET BY MOUTH  EVERY EVENING Patient taking differently: Take 40 mg by mouth at bedtime.  03/09/18  Yes McGowen, Adrian Blackwater, MD  sevelamer carbonate (RENVELA) 800 MG tablet Take 800-2,400 mg by mouth See admin instructions. Take two - three tablets (1600 mg - 2400 mg) by mouth up to three times daily with meals, take one tablet (800 mg) with snacks   Yes [provider]  vitamin C (ASCORBIC ACID) 500 MG tablet Take 500 mg by mouth daily.   Yes [provider]  Vitamin D, Ergocalciferol, (DRISDOL) 1.25 MG (50000 UT) CAPS capsule Take 1 capsule (50,000 Units total) by mouth every Thursday. 04/28/18  Yes McGowen, Adrian Blackwater, MD  heparin 1000 unit/mL SOLN injection 1,000 Units by Dialysis route one time in dialysis.  02/07/19 02/06/20  [provider]   Current Facility-Administered Medications  Medication Dose Route Frequency Provider Last Rate Last Admin  . acetaminophen (TYLENOL) tablet 650 mg  650 mg Oral Q6H PRN Karmen Bongo, MD       Or  . acetaminophen (TYLENOL) suppository 650 mg  650 mg Rectal Q6H PRN Karmen Bongo, MD      . amiodarone (PACERONE) tablet 200 mg  200 mg Oral Daily Karmen Bongo, MD      . aspirin EC tablet 325 mg  325  mg Oral QPM Karmen Bongo, MD      . calcitonin (salmon) (MIACALCIN/FORTICAL) nasal spray 1 spray  1 spray Alternating Nares Daily Karmen Bongo, MD      . calcium carbonate (dosed in mg elemental calcium) suspension 500 mg of elemental calcium  500 mg of elemental calcium Oral Q6H PRN Karmen Bongo, MD      . camphor-menthol [facility]) lotion 1 application  1 application Topical Z6X PRN Karmen Bongo, MD       And  . hydrOXYzine (ATARAX/VISTARIL) tablet 25 mg  25 mg Oral Q8H PRN Karmen Bongo, MD      . Derrill Memo ON 08/17/2019] Chlorhexidine Gluconate Cloth 2 % PADS 6 each  6 each Topical Q0600 Lynnda Child, PA-C      . docusate sodium (ENEMEEZ) enema 283 mg  1 enema Rectal PRN Karmen Bongo, MD      . Derrill Memo ON 08/18/2019] doxercalciferol (HECTOROL) capsule 2.5 mcg  2.5 mcg Oral Once per day on Mon Wed Fri Yates, Jennifer, MD      . feeding supplement (NEPRO CARB STEADY) liquid 237 mL  237 mL Oral TID PRN Karmen Bongo, MD      . fentaNYL ([place]) 50 MCG/HR 1 patch  1 patch Transdermal Kyra Searles, MD      . heparin injection 5,000 Units  5,000 Units Subcutaneous Lynne Logan, MD      . isosorbide mononitrate (IMDUR) 24 hr tablet 60 mg  60 mg Oral Daily Karmen Bongo, MD      . lidocaine-prilocaine (EMLA) cream 1 application  1 application Topical See admin instructions Karmen Bongo, MD      .  multivitamin (RENA-VIT) tablet 1 tablet  1 tablet Oral QHS Karmen Bongo, MD      . ondansetron [facility]) tablet 4 mg  4 mg Oral Q6H PRN Karmen Bongo, MD       Or  . ondansetron [facility]) injection 4 mg  4 mg Intravenous Q6H PRN Karmen Bongo, MD      . oxyCODONE (Oxy IR/ROXICODONE) immediate release tablet 10-20 mg  10-20 mg Oral Q6H PRN Karmen Bongo, MD      . polyvinyl alcohol (LIQUIFILM TEARS) 1.4 % ophthalmic solution 1-2 drop  1-2 drop Both Eyes Daily PRN Karmen Bongo, MD      . pravastatin (PRAVACHOL) tablet 40 mg  40 mg Oral QHS Karmen Bongo, MD       . sevelamer carbonate (RENVELA) tablet 800-2,400 mg  800-2,400 mg Oral See admin instructions Karmen Bongo, MD      . sodium chloride flush (NS) 0.9 % injection 3 mL  3 mL Intravenous Lillia Mountain, MD      . sorbitol 70 % solution 30 mL  30 mL Oral PRN Karmen Bongo, MD      . zolpidem Lorrin Mais) tablet 5 mg  5 mg Oral QHS PRN Karmen Bongo, MD       Current Outpatient Medications  Medication Sig Dispense Refill  . acetaminophen (TYLENOL) 500 MG tablet Take 500-1,000 mg 3 (three) times daily as needed by mouth for moderate pain.     [name] amiodarone (PACERONE) 200 MG tablet TAKE 1 TABLET BY MOUTH  DAILY (Patient taking differently: Take 200 mg by mouth daily. ) 90 tablet 1  . aspirin EC 325 MG tablet Take 325 mg by mouth every evening.    [name] b complex-vitamin c-folic acid (NEPHRO-VITE) 0.8 MG TABS tablet Take 1 tablet by mouth See admin instructions. Take one tablet by mouth on Tuesday, Thursday, Saturday - after dialysis    . calcitonin, salmon, (MIACALCIN/FORTICAL) 200 UNIT/ACT nasal spray Place 1 spray into alternate nostrils daily.     . carvedilol (COREG) 3.125 MG tablet TAKE 1 TABLET BY MOUTH TWO  TIMES DAILY (Patient taking differently: Take 3.125 mg by mouth as needed (blood pressure). ) 180 tablet 1  . DENTA 5000 PLUS 1.1 % CREA dental cream Place 1 application onto teeth at bedtime.     . docusate sodium (COLACE) 100 MG capsule Take 100 mg by mouth daily as needed for mild constipation.     [name] doxercalciferol (HECTOROL) 2.5 MCG capsule Take 2.5 mcg by mouth 3 (three) times a week.     . fentaNYL (DURAGESIC) 50 MCG/HR Place 1 patch onto the skin every 3 (three) days. 5 patch 0  . isosorbide mononitrate (IMDUR) 60 MG 24 hr tablet TAKE 1 TABLET BY MOUTH  DAILY ; TAKE 1 TABLET ON  SUN, MON, WED, FRI MORNINGS AND 1 TABLET ON TUES, THU,  SAT AFTER DIALYSIS (Patient taking differently: Take 60 mg by mouth daily. ) 90 tablet 3  . Lidocaine 4 % PTCH Apply 1 patch topically daily as  needed (Mild pain).    [name] lidocaine-prilocaine (EMLA) cream Apply 1 application topically See admin instructions. Apply small amount to access site (AVF) one hour before dialysis, cover with occlusive dressing (saran wrap)    . Multiple Vitamins-Minerals (MULTIVITAMIN WITH MINERALS) tablet Take 1 tablet by mouth at bedtime.     . naloxone (NARCAN) 0.4 MG/ML injection Inject IM as needed for reversal of opioid pain medication (Patient taking differently: Inject 0.4 mg into the muscle  See admin instructions. Inject IM as needed for reversal of opioid pain medication) 1 mL 1  . ondansetron (ZOFRAN) 4 MG tablet Take 1 tablet (4 mg total) by mouth every 8 (eight) hours as needed for nausea or vomiting. 60 tablet 1  . Oxycodone HCl 10 MG TABS 1-2 tabs po q6h prn severe pain (Patient taking differently: Take 10-20 mg by mouth every 6 (six) hours as needed (Severe pain). 1-2 tabs po q6h prn severe pain) 90 tablet 0  . polyvinyl alcohol (LIQUIFILM TEARS) 1.4 % ophthalmic solution Place 1-2 drops into both eyes daily as needed for dry eyes.     . pravastatin (PRAVACHOL) 40 MG tablet TAKE 1 TABLET BY MOUTH  EVERY EVENING (Patient taking differently: Take 40 mg by mouth at bedtime. ) 90 tablet 1  . sevelamer carbonate (RENVELA) 800 MG tablet Take 800-2,400 mg by mouth See admin instructions. Take two - three tablets (1600 mg - 2400 mg) by mouth up to three times daily with meals, take one tablet (800 mg) with snacks    . vitamin C (ASCORBIC ACID) 500 MG tablet Take 500 mg by mouth daily.    . Vitamin D, Ergocalciferol, (DRISDOL) 1.25 MG (50000 UT) CAPS capsule Take 1 capsule (50,000 Units total) by mouth every Thursday. 12 capsule 3  . heparin 1000 unit/mL SOLN injection 1,000 Units by Dialysis route one time in dialysis.        ROS: As per HPI otherwise negative.  Physical Exam: Vitals:   08/16/19 0434 08/16/19 0730 08/16/19 0800 08/16/19 0900  BP:  (!) 139/52 (!) 125/48 (!) 118/48  Pulse:  78 74 79  Resp:   16    Temp:  98.1 F (36.7 C)    TempSrc:  Oral    SpO2:  98% 92% (!) 89%  Weight: 60 kg     Height: 4\' 2"  (1.27 m)        General: Elderly female, alert, nad  Head: NCAT sclera not icteric MMM Neck: Supple. No JVD  Lungs: Breathing unlabored. Faint crackles at bases  Heart: RRR with S1 S2 Abdomen: soft non-tender; non-distended  Lower extremities: 3+ pitting edema bilaterally to knees  Neuro: A & O  X 3. Moves all extremities spontaneously. Psych:  Responds to questions appropriately with a normal affect. Dialysis Access: RUE AVG +bruit   Labs: Basic Metabolic Panel: Recent Labs  Lab 08/11/19 0929 08/16/19 0447  NA 138 133*  K 4.3 4.5  CL 92* 92*  CO2 28 29  GLUCOSE 93 82  BUN 11 9  CREATININE 3.57* 2.96*  CALCIUM 8.3* 8.0*   Liver Function Tests: Recent Labs  Lab 08/11/19 0929 08/16/19 0447  AST 23 24  ALT 15 12  ALKPHOS 87 88  BILITOT 0.6 0.3  PROT 5.8* 6.6  ALBUMIN 2.3* 2.5*   No results for input(s): LIPASE, AMYLASE in the last 168 hours. No results for input(s): AMMONIA in the last 168 hours. CBC: Recent Labs  Lab 08/11/19 0929 08/16/19 0447  WBC 8.0 6.6  NEUTROABS 5.8 4.2  HGB 12.3 12.4  HCT 40.7 40.3  MCV 101.8* 99.8  PLT 176 227   Cardiac Enzymes: No results for input(s): CKTOTAL, CKMB, CKMBINDEX, TROPONINI in the last 168 hours. CBG: Recent Labs  Lab 08/11/19 0825  GLUCAP 105*   Iron Studies: No results for input(s): IRON, TIBC, TRANSFERRIN, FERRITIN in the last 72 hours. Studies/Results: DG Chest 2 View  Result Date: 08/16/2019 CLINICAL DATA:  Shortness of breath EXAM:  CHEST - 2 VIEW COMPARISON:  08/11/2019 FINDINGS: The heart is enlarged but stable. Stable tortuosity and calcification of the thoracic aorta. The pacer wires are stable. Low lung volumes with vascular crowding and streaky basilar atelectasis. No focal infiltrates or pulmonary edema. Stable scoliosis and evidence of prior thoracic vertebral augmentation. IMPRESSION: Low  lung volumes with vascular crowding and streaky basilar atelectasis. No infiltrates or edema. Electronically Signed   By: Marijo Sanes M.D.   On: 08/16/2019 05:37    Dialysis Orders:  NW MTTS 4h 350/600 EDW 51.5kg 2K/2Ca AVG Heparin 1400  Hectorol 3   Assessment/Plan: 1. Dyspnea/Volume overload. Not meeting UF goals as outpatient d/t hypotension/difficulty completing full treatments d/t back pain. May require serial treatments to get volume down. Plan for HD today.  Follow weights. Continue fluid restriction.  2. ESRD -  HD TTS. Has orders for OP dialysis 4 days/week but clinic has been unable to schedule chair time for 4th day. Plan HD today off schedule.  3. Hypertension  BP stable.  4. Anemia  - Hgb 12.4. No ESA needs.  5. Metabolic bone disease -  Continue Hectrol/Renvela binders  6. Nutrition - Renal diet/vitamins/fluid restriction/prot supp  7. PAFib - amiodarone. Not on anticoagulation d/t hx "large perineal hematoma"   Lynnda Child PA-C [place] Pager [phone] 08/16/2019, 12:53 PM

## 2019-08-16 NOTE — Plan of Care (Signed)
  Problem: Nutrition: Goal: Adequate nutrition will be maintained Outcome: Progressing   

## 2019-08-16 NOTE — ED Notes (Signed)
Ambulated in room with 2 assist, pt has a very difficult time ambulating, getting out of bed, needs 2 assist and walker. Weak getting back into bed.   O2 sats dropped to 75-80% on room air while walking. Has 3+ pitting edema in lower legs. Lives with daughter who is sole caregiver. Daughter is concerned re: ability to safely care for mom at home and her increased fluid retention.

## 2019-08-16 NOTE — Progress Notes (Signed)
NEW ADMISSION NOTE New Admission Note:   Arrival Method: stretcher from ED Mental Orientation: axox4 Telemetry: Box 60m9 Assessment: Completed Skin: see skin assessment  IV: left AC  Pain:denies Tubes: none Safety Measures: Safety Fall Prevention Plan has been discussed Admission: Completed [facility] Orientation: Patient has been orientated to the room, unit and staff.  Family:none at the bedside   Orders have been reviewed and implemented. Will continue to monitor the patient. Call light has been placed within reach and bed alarm has been activated.   Paulla Fore, RN, BSN

## 2019-08-16 NOTE — ED Provider Notes (Signed)
[facility] DEPART[facility] Provider Note   CSN: [id number] Arrival date & time: 08/16/19  0430     History Chief Complaint  Patient presents with  . Shortness of Breath    Valerie Terrell is a 86 y.o. female.  HPI 86 year old female presents with shortness of breath.  Patient started noticing shortness of breath around 2 AM.  Was having significant difficulty breathing until EMS placed oxygen.  She feels some mild shortness of breath now but is much better.  She has been battling peripheral edema in her lower extremities for several weeks.  Seems to be worsening.  She is on dialysis and last had dialysis yesterday.  For a couple weeks they have been told that she is to be increased to 4 times a week dialysis but this has not happened.  They are concerned because the swelling is getting worse.  Nephrologist is Dr. Lorrene Reid.  No chest pain or fever.  Past Medical History:  Diagnosis Date  . Anemia of chronic disease 2018   + anemia of CRI?  [name] Atrophy of left kidney    with absent blood flow by renal artery dopplers (Dr. Gwenlyn Found)  . 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 Chronic combined systolic and diastolic CHF (congestive heart failure) ([place])    a. 11/2016: echo showing EF of 35-40%, RV strain noted, mild MR and mild TR.   Echo showed much improved EF 12/2017 (55-60%)  . Chronic respiratory failure with hypoxia ([place]) 02/11/2017   Now on chronic O2 (intermittent as of summer 2019).    . Closed fracture of right superior pubic ramus ([place]) 07/18/2018   Non op mgmt (Dr. Victorino December was ortho who saw her in hosp but i don't think pt followed up with him).  . Closed right hip fracture ([place]) 10/04/2018   ORIF  . Debilitated patient    WC dependent as of 2018.  Hosp bed + full assistance with most ADLs s/p hip fx surg 09/2018.  [name] ESRD on hemodialysis ([place]) 01/2017   T/Th/Sat schedule- [place].  Right basilic AV fistula 06/7251.  Graft 2019.  [name]  History of adenomatous polyp of colon   . History of kidney stones   . History of subarachnoid hemorrhage 10/2014   after syncope and while on xarelto  . Hyperlipidemia   . Hypertension    Difficult to control, in the setting of one functioning kidney: pt was referred to nephrology by Dr. Gwenlyn Found 06/2015.  . Lumbar radiculopathy 2012  . Lumbar spinal stenosis 2019   facet injections only very short term relief.  L1 and L2 selective nerve root blocks helpful 04/2018.  . Lumbar spondylosis    MR 07/2016---no sign of spinal nerve compression or cord compression.  Pt set up with outpt ortho while admitted to hosp 07/2016.  . Malnourished ([name])   . Metatarsal fracture 06/10/2016   Nondisplaced, left 5th metatarsal--pt was referred to ortho  . Osteopenia 2014   T-score -2.1  . PAF (paroxysmal atrial fibrillation) (HCC)    Eliquis started after BRAO and CVA.  She was changed to warfarin 2018. All anticoag stopped due to recurrent falls.  . Presence of permanent cardiac pacemaker   . Right rib fracture 12/2016   s/p fall  . Sick sinus syndrome ([place]) 11/2016   Dual chamber pacer insertion 2018 (Dr. Lovena Le)  . Stroke ([place]) 66/4403   cardioembolic (had CVA while on no anticoag)--"scattered subacute punctate infarcts: 1 in R parietal  lobe and 2 in occipital cortex" on MRI br.  CT angio head/neck: aortic arch athero.  R ICA 20% stenosis, L ICA w/out any stenosis.  . Thoracic back pain 01/2017; 01/2018   2018: Facet?  Dr. Ramos->steroid injection.  01/2018-->CT T spine to check for a new comp fx-->none found.  Selective nerve root inj in T spine helpful on R side.   . Thoracic compression fracture ([place]) 07/2016   T3.  T7 and T8-- T8 kyphoplasty during hosp admission 07/2016.  Neuro referred pt to pain mgmt for consideration of injection 09/2016.  I referred her to endo 08/2016 for consideration of calcitonin treatment.    Patient Active Problem List   Diagnosis Date Noted  . Volume overload 08/16/2019  .  Disorder of phosphorus metabolism, unspecified 05/15/2019  . Hypoxemia 03/24/2019  . Anaphylactic shock, unspecified, sequela 02/23/2019  . Heterotopic ossification 12/29/2018  . Pain, unspecified 10/15/2018  . Hip fracture ([place]) 10/04/2018  . Fracture of pubic ramus ([place]) 07/19/2018  . Fracture of sacrum ([place]) 07/19/2018  . Perineal hematoma 07/19/2018  . Fever 07/19/2018  . Acute blood loss anemia 07/19/2018  . ESRD (end stage renal disease) on dialysis ([place]) 05/20/2018  . Chronic back pain 05/14/2018  . Chronic pain syndrome 05/14/2018  . Iron deficiency anemia, unspecified 04/25/2018  . Hypoxia 12/14/2017  . Dyspnea 12/14/2017  . Secondary hyperparathyroidism of renal origin ([place]) 08/16/2017  . Degeneration of thoracic intervertebral disc 07/26/2017  . Moderate protein-calorie malnutrition ([place]) 02/23/2017  . Acute respiratory failure ([place]) 02/13/2017  . Hyperkalemia 02/13/2017  . Chronic respiratory failure with hypoxia ([place]) 02/11/2017  . Debilitated patient 02/07/2017  . Poor tolerance for ambulation 02/07/2017  . Anemia in chronic kidney disease 02/04/2017  . Coagulation defect, unspecified ([place]) 02/04/2017  . Fracture of one rib, right side, initial encounter for closed fracture 01/11/2017  . Closed fracture of one rib of right side   . Chronic combined systolic and diastolic heart failure ([place]) 01/05/2017  . Palpitations 12/15/2016  . Hypokalemia 12/14/2016  . Leukocytosis 12/14/2016  . Long term (current) use of anticoagulants [Z79.01] 12/11/2016  . Non-ischemic cardiomyopathy ([place]) 12/07/2016  . Acute combined systolic and diastolic heart failure ([place])  12/07/2016  . Cardiac pacemaker   . Acute on chronic respiratory failure with hypoxia ([place])   . Acute pulmonary edema with congestive heart failure ([place]) 11/30/2016  . Elevated troponin level 11/30/2016  . Anemia 11/30/2016  . Chronic midline thoracic back pain 09/15/2016  . History of CVA (cerebrovascular  accident) 09/15/2016  . Hyperlipidemia   . Nausea & vomiting 08/05/2016  . Dehydration 08/05/2016  . Osteoporosis 07/28/2016  . Closed compression fracture of thoracic vertebra ([place]) 07/23/2016  . Thoracic compression fracture ([place]) 07/22/2016  . Flank pain 07/11/2016  . Hypoalbuminemia 07/11/2016  . Renal artery stenosis ([place]) 06/24/2016  . Nondisplaced fracture of fifth metatarsal bone, left foot, initial encounter for closed fracture 06/10/2016  . Rib contusion, left, initial encounter 06/10/2016  . Single kidney 03/13/2016  . Chest pain   . Dyslipidemia   . PAF (paroxysmal atrial fibrillation) ([place]) 01/28/2016  . Essential hypertension 01/28/2016  . History of cardioembolic stroke 14/97/0263  . Personal history of subarachnoid hemorrhage 01/28/2016  . CKD (chronic kidney disease), stage IV ([place]) 01/28/2016  . Gait disturbance 01/27/2016  . Mitral valve disease 01/08/2014  . Colon polyps 07/10/2013  . Lumbar radiculopathy 02/04/2011    Past Surgical History:  Procedure Laterality Date  . APPENDECTOMY  child  . AV FISTULA  PLACEMENT Right 03/31/2017   Procedure: Right ARM Basilic vein transposition;  Surgeon: Conrad [place], MD;  Location: [facility] OR;  Service: Vascular;  Laterality: Right;  . AV FISTULA PLACEMENT Right 12/06/2017   Procedure: INSERTION OF ARTERIOVENOUS (AV) GORE-TEX GRAFT ARM RIGHT UPPER EXTREMITY;  Surgeon: [name], MD;  Location: [facility];  Service: Vascular;  Laterality: Right;  . CARDIOVASCULAR STRESS TEST  01/31/2016   Stress myoview: NORMAL/Low risk.  EF 56%.  . [place]  . COLONOSCOPY  2015   + hx of adenomatous polyps.  Need digest health spec in [place] records to see when pt due for next colonoscopy  . DECLOT CKV Right 03/21/2019  . EYE SURGERY Bilateral    Catarct  . INSERTION OF DIALYSIS CATHETER Right 01/29/2017   Procedure: INSERTION OF DIALYSIS CATHETER- RIGHT INTERNAL JUGULAR;  Surgeon: [name], MD;  Location: [facility];   Service: Vascular;  Laterality: Right;  . INTRAMEDULLARY (IM) NAIL INTERTROCHANTERIC Right 10/04/2018   Procedure: INTRAMEDULLARY (IM) NAIL INTERTROCHANTRIC;  Surgeon: Nicholes Stairs, MD;  Location: [facility];  Service: Orthopedics;  Laterality: Right;  . IR GENERIC HISTORICAL  07/24/2016   IR KYPHO THORACIC WITH BONE BIOPSY 07/24/2016 Luanne Bras, MD [facility]  . KYPHOPLASTY  07/27/2016   T8  . PACEMAKER IMPLANT N/A 12/03/2016   Procedure: Pacemaker Implant;  Surgeon: Evans Lance, MD;  Location: [facility];  Service: Cardiovascular;  Laterality: N/A;  . Renal artery dopplers  02/26/2016   Her right renal dimension was 11 cm pole to pole with mild to moderate right renal artery stenosis. A right renal aortic ratio was 3.22 suggesting less than a 50% stenosis.  . Right upper arm AV Gore-Tex graft  12/06/2017   Dr. Donnetta Hutching  . TONSILLECTOMY    . TRANSTHORACIC ECHOCARDIOGRAM  01/29/2016; 11/2016; 12/2017   2017: EF 55-60%, normal LV wall motion, grade I DD.  No cardiac source of emboli was seen.  2018: EF 35-40%, could not assess DD due to a-fib, pulm HTN noted. 12/2017 EF 55-60%, nl wall motion, grd II DD, mod MR and pulm regurg, increased pulm pressure (peak 52).     OB History   No obstetric history on file.     Family History  Problem Relation Age of Onset  . Cancer Mother   . Heart disease Father   . Early death Father   . Sudden Cardiac Death Neg Hx     Social History   Tobacco Use  . Smoking status: Former Smoker    Years: 22.00    Quit date: 03/17/1972    Years since quitting: 47.4  . Smokeless tobacco: Never Used  Substance Use Topics  . Alcohol use: Yes    Alcohol/week: 1.0 standard drinks    Types: 1 Glasses of wine per week    Comment: wine  . Drug use: No    Home Medications Prior to Admission medications   Medication Sig Start Date End Date Taking? Authorizing Provider  acetaminophen (TYLENOL) 500 MG tablet Take 500-1,000 mg 3 (three) times  daily as needed by mouth for moderate pain.    Yes [provider]  amiodarone (PACERONE) 200 MG tablet TAKE 1 TABLET BY MOUTH  DAILY Patient taking differently: Take 200 mg by mouth daily.  03/27/19  Yes Lorretta Harp, MD  aspirin EC 325 MG tablet Take 325 mg by mouth every evening.   Yes [provider]  b complex-vitamin c-folic acid (NEPHRO-VITE)  0.8 MG TABS tablet Take 1 tablet by mouth See admin instructions. Take one tablet by mouth on Tuesday, Thursday, Saturday - after dialysis 09/07/18  Yes [provider]  calcitonin, salmon, (MIACALCIN/FORTICAL) 200 UNIT/ACT nasal spray Place 1 spray into alternate nostrils daily.  07/06/19  Yes [provider]  carvedilol (COREG) 3.125 MG tablet TAKE 1 TABLET BY MOUTH TWO  TIMES DAILY Patient taking differently: Take 3.125 mg by mouth as needed (blood pressure).  06/27/18  Yes Lorretta Harp, MD  DENTA 5000 PLUS 1.1 % CREA dental cream Place 1 application onto teeth at bedtime.  04/06/19  Yes [provider]  docusate sodium (COLACE) 100 MG capsule Take 100 mg by mouth daily as needed for mild constipation.    Yes [provider]  doxercalciferol (HECTOROL) 2.5 MCG capsule Take 3 mg by mouth 3 (three) times a week.  02/28/19 02/27/20 Yes [provider]  fentaNYL (DURAGESIC) 50 MCG/HR Place 1 patch onto the skin every 3 (three) days. 08/02/19  Yes McGowen, Adrian Blackwater, MD  isosorbide mononitrate (IMDUR) 60 MG 24 hr tablet TAKE 1 TABLET BY MOUTH  DAILY ; TAKE 1 TABLET ON  SUN, MON, WED, FRI MORNINGS AND 1 TABLET ON TUES, THU,  SAT AFTER DIALYSIS Patient taking differently: Take 60 mg by mouth daily.  07/21/19  Yes McGowen, Adrian Blackwater, MD  Lidocaine 4 % PTCH Apply 1 patch topically daily as needed (Mild pain).   Yes [provider]  lidocaine-prilocaine (EMLA) cream Apply 1 application topically See admin instructions. Apply small amount to access site (AVF) one hour before dialysis, cover  with occlusive dressing (saran wrap) 11/11/18  Yes [provider]  Multiple Vitamins-Minerals (MULTIVITAMIN WITH MINERALS) tablet Take 1 tablet by mouth at bedtime.    Yes [provider]  naloxone (NARCAN) 0.4 MG/ML injection Inject IM as needed for reversal of opioid pain medication Patient taking differently: Inject 0.4 mg into the muscle See admin instructions. Inject IM as needed for reversal of opioid pain medication 07/20/19  Yes McGowen, Adrian Blackwater, MD  ondansetron (ZOFRAN) 4 MG tablet Take 1 tablet (4 mg total) by mouth every 8 (eight) hours as needed for nausea or vomiting. 07/12/19  Yes McGowen, Adrian Blackwater, MD  Oxycodone HCl 10 MG TABS 1-2 tabs po q6h prn severe pain Patient taking differently: Take 10-20 mg by mouth every 6 (six) hours as needed (Severe pain). 1-2 tabs po q6h prn severe pain 07/07/19  Yes McGowen, Adrian Blackwater, MD  polyvinyl alcohol (LIQUIFILM TEARS) 1.4 % ophthalmic solution Place 1-2 drops into both eyes daily as needed for dry eyes.    Yes [provider]  pravastatin (PRAVACHOL) 40 MG tablet TAKE 1 TABLET BY MOUTH  EVERY EVENING Patient taking differently: Take 40 mg by mouth at bedtime.  03/09/18  Yes McGowen, Adrian Blackwater, MD  sevelamer carbonate (RENVELA) 800 MG tablet Take 800-2,400 mg by mouth See admin instructions. Take two - three tablets (1600 mg - 2400 mg) by mouth up to three times daily with meals, take one tablet (800 mg) with snacks   Yes [provider]  vitamin C (ASCORBIC ACID) 500 MG tablet Take 500 mg by mouth daily.   Yes [provider]  Vitamin D, Ergocalciferol, (DRISDOL) 1.25 MG (50000 UT) CAPS capsule Take 1 capsule (50,000 Units total) by mouth every Thursday. 04/28/18  Yes McGowen, Adrian Blackwater, MD  heparin 1000 unit/mL SOLN injection 1,000 Units by Dialysis route one time in dialysis.  02/07/19 02/06/20  [provider]    Allergies    Clonidine derivatives, Adhesive [tape], Codeine, Nickel, and Sulfa  antibiotics  Review of Systems   Review of Systems  Constitutional: Negative for fever.  Respiratory: Positive for shortness of breath.   Cardiovascular: Positive for leg swelling. Negative for chest pain.  All other systems reviewed and are negative.   Physical Exam Updated Vital Signs BP (!) 118/48   Pulse 79   Temp 98.1 F (36.7 C) (Oral)   Resp 16   Ht 4\' 2"  (1.27 m)   Wt 60 kg   LMP  (LMP Unknown)   SpO2 (!) 89%   BMI 37.20 kg/m   Physical Exam Vitals and nursing note reviewed.  Constitutional:      General: She is not in acute distress.    Appearance: She is well-developed. She is not ill-appearing or diaphoretic.  HENT:     Head: Normocephalic and atraumatic.     Right Ear: External ear normal.     Left Ear: External ear normal.     Nose: Nose normal.  Eyes:     General:        Right eye: No discharge.        Left eye: No discharge.  Cardiovascular:     Rate and Rhythm: Normal rate and regular rhythm.     Heart sounds: Murmur present.  Pulmonary:     Effort: Pulmonary effort is normal.     Breath sounds: Normal breath sounds. No wheezing, rhonchi or rales.  Abdominal:     Palpations: Abdomen is soft.     Tenderness: There is no abdominal tenderness.  Musculoskeletal:     Right lower leg: Edema present.     Left lower leg: Edema present.     Comments: Significant pitting edema to BLE  Skin:    General: Skin is warm and dry.  Neurological:     Mental Status: She is alert.  Psychiatric:        Mood and Affect: Mood is not anxious.     ED Results / Procedures / Treatments   Labs (all labs ordered are listed, but only abnormal results are displayed) Labs Reviewed  CBC WITH DIFFERENTIAL/PLATELET - Abnormal; Notable for the following components:      Result Value   RDW 16.6 (*)    Monocytes Absolute 1.2 (*)    All other components within normal limits  COMPREHENSIVE METABOLIC PANEL - Abnormal; Notable for the following components:   Sodium 133 (*)     Chloride 92 (*)    Creatinine, Ser 2.96 (*)    Calcium 8.0 (*)    Albumin 2.5 (*)    GFR calc non Af Amer 14 (*)    GFR calc Af Amer 16 (*)    All other components within normal limits  BRAIN NATRIURETIC PEPTIDE - Abnormal; Notable for the following components:   B Natriuretic Peptide 1,660.5 (*)    All other components within normal limits  TROPONIN I (HIGH SENSITIVITY) - Abnormal; Notable for the following components:   Troponin I (High Sensitivity) 34 (*)    All other components within normal limits  TROPONIN I (HIGH SENSITIVITY) - Abnormal; Notable for the following components:   Troponin I (High Sensitivity) 33 (*)    All other components within normal limits  SARS CORONAVIRUS 2 (TAT 6-24 HRS)    EKG EKG Interpretation  Date/Time:  Wednesday August 16 2019 04:37:21 EDT Ventricular Rate:  79 PR Interval:  160 QRS Duration: 84 QT Interval:  414 QTC Calculation: 474 R Axis:   11 Text Interpretation: Normal sinus rhythm Nonspecific ST and T wave abnormality Abnormal ECG Confirmed by Lennice Sites [phone]) on 08/16/2019 7:06:38 AM   Radiology DG Chest 2 View  Result Date: 08/16/2019 CLINICAL DATA:  Shortness of breath EXAM: CHEST - 2 VIEW COMPARISON:  08/11/2019 FINDINGS: The heart is enlarged but stable. Stable tortuosity and calcification of the thoracic aorta. The pacer wires are stable. Low lung volumes with vascular crowding and streaky basilar atelectasis. No focal infiltrates or pulmonary edema. Stable scoliosis and evidence of prior thoracic vertebral augmentation. IMPRESSION: Low lung volumes with vascular crowding and streaky basilar atelectasis. No infiltrates or edema. Electronically Signed   By: Marijo Sanes M.D.   On: 08/16/2019 05:37    Procedures Procedures (including critical care time)  Medications Ordered in ED Medications  aspirin EC tablet 325 mg (has no administration in time range)  fentaNYL (DURAGESIC) 50 MCG/HR 1 patch (has no administration in  time range)  Oxycodone HCl TABS 10-20 mg (has no administration in time range)  amiodarone (PACERONE) tablet 200 mg (has no administration in time range)  pravastatin (PRAVACHOL) tablet 40 mg (has no administration in time range)  isosorbide mononitrate (IMDUR) 24 hr tablet 60 mg (has no administration in time range)  calcitonin (salmon) (MIACALCIN/FORTICAL) nasal spray 1 spray (has no administration in time range)  sevelamer carbonate (RENVELA) tablet 800-2,400 mg (has no administration in time range)  multivitamin (RENA-VIT) tablet 1 tablet (has no administration in time range)  lidocaine-prilocaine (EMLA) cream 1 application (has no administration in time range)  polyvinyl alcohol (LIQUIFILM TEARS) 1.4 % ophthalmic solution 1-2 drop (has no administration in time range)  acetaminophen (TYLENOL) tablet 650 mg (has no administration in time range)    Or  acetaminophen (TYLENOL) suppository 650 mg (has no administration in time range)  zolpidem (AMBIEN) tablet 5 mg (has no administration in time range)  sorbitol 70 % solution 30 mL (has no administration in time range)  docusate sodium (ENEMEEZ) enema 283 mg (has no administration in time range)  ondansetron (ZOFRAN) tablet 4 mg (has no administration in time range)    Or  ondansetron (ZOFRAN) injection 4 mg (has no administration in time range)  camphor-menthol (SARNA) lotion 1 application (has no administration in time range)    And  hydrOXYzine (ATARAX/VISTARIL) tablet 25 mg (has no administration in time range)  calcium carbonate (dosed in mg elemental calcium) suspension 500 mg of elemental calcium (has no administration in time range)  feeding supplement (NEPRO CARB STEADY) liquid 237 mL (has no administration in time range)  heparin injection 5,000 Units (has no administration in time range)  sodium chloride flush (NS) 0.9 % injection 3 mL (3 mLs Intravenous Not Given 08/16/19 1227)  Chlorhexidine Gluconate Cloth 2 % PADS 6 each (has  no administration in time range)    ED Course  I have reviewed the triage vital signs and the nursing notes.  Pertinent labs & imaging results that were available during my care of the patient were reviewed by me and considered in my medical decision making (see chart for details).    MDM Rules/Calculators/A&P                      Patient is in no acute distress but does have worsening peripheral edema and shortness of breath.  No obvious rales or edema on chest x-ray but when she ambulated  she was quite short of breath and her O2 sats went into the 70s.  Came up at rest.  I think she will need admission for diuresis which will have to come by dialysis.  Discussed with Dr. Royce Macadamia who will consult.  Dr. Lorin Mercy will admit. Final Clinical Impression(s) / ED Diagnoses Final diagnoses:  Acute diastolic congestive heart failure [facility])    Rx / DC Orders ED Discharge Orders    None       Sherwood Gambler, MD 08/16/19 1238

## 2019-08-17 DIAGNOSIS — L8990 Pressure ulcer of unspecified site, unspecified stage: Secondary | ICD-10-CM | POA: Insufficient documentation

## 2019-08-17 DIAGNOSIS — M549 Dorsalgia, unspecified: Secondary | ICD-10-CM | POA: Diagnosis not present

## 2019-08-17 DIAGNOSIS — I5031 Acute diastolic (congestive) heart failure: Secondary | ICD-10-CM

## 2019-08-17 DIAGNOSIS — I5041 Acute combined systolic (congestive) and diastolic (congestive) heart failure: Secondary | ICD-10-CM | POA: Diagnosis not present

## 2019-08-17 DIAGNOSIS — J9601 Acute respiratory failure with hypoxia: Secondary | ICD-10-CM

## 2019-08-17 DIAGNOSIS — E785 Hyperlipidemia, unspecified: Secondary | ICD-10-CM | POA: Diagnosis not present

## 2019-08-17 DIAGNOSIS — N186 End stage renal disease: Secondary | ICD-10-CM | POA: Diagnosis not present

## 2019-08-17 LAB — BASIC METABOLIC PANEL
Anion gap: 9 (ref 5–15)
BUN: <5 mg/dL — ABNORMAL LOW (ref 8–23)
CO2: 32 mmol/L (ref 22–32)
Calcium: 8.1 mg/dL — ABNORMAL LOW (ref 8.9–10.3)
Chloride: 99 mmol/L (ref 98–111)
Creatinine, Ser: 1.71 mg/dL — ABNORMAL HIGH (ref 0.44–1.00)
GFR calc Af Amer: 31 mL/min — ABNORMAL LOW (ref >60)
GFR calc non Af Amer: 27 mL/min — ABNORMAL LOW (ref >60)
Glucose, Bld: 79 mg/dL (ref 70–99)
Potassium: 4 mmol/L (ref 3.5–5.1)
Sodium: 140 mmol/L (ref 135–145)

## 2019-08-17 LAB — CBC
HCT: 34.5 % — ABNORMAL LOW (ref 36.0–46.0)
Hemoglobin: 10.7 g/dL — ABNORMAL LOW (ref 12.0–15.0)
MCH: 31.5 pg (ref 26.0–34.0)
MCHC: 31 g/dL (ref 30.0–36.0)
MCV: 101.5 fL — ABNORMAL HIGH (ref 80.0–100.0)
Platelets: 205 10*3/uL (ref 150–400)
RBC: 3.4 MIL/uL — ABNORMAL LOW (ref 3.87–5.11)
RDW: 16.6 % — ABNORMAL HIGH (ref 11.5–15.5)
WBC: 6.5 10*3/uL (ref 4.0–10.5)
nRBC: 0 % (ref 0.0–0.2)

## 2019-08-17 MED ORDER — DOXERCALCIFEROL 4 MCG/2ML IV SOLN
INTRAVENOUS | Status: AC
  Administered 2019-08-17: 3 ug via INTRAVENOUS
  Filled 2019-08-17: qty 2

## 2019-08-17 MED ORDER — ALBUMIN HUMAN 25 % IV SOLN
INTRAVENOUS | Status: AC
  Administered 2019-08-17: 25 g
  Filled 2019-08-17: qty 100

## 2019-08-17 NOTE — Discharge Summary (Signed)
Physician Discharge Summary  Valerie Terrell HWE:[id number] DOB: [birth year] DOA: 08/16/2019  PCP: Tammi Sou, MD  Admit date: 08/16/2019 Discharge date: 08/17/2019  Admitted From: home Discharge disposition: home   Recommendations for Outpatient Follow-Up:   1. Follow new 4 days a week dialysis schedule 2. Follow up with nephrology 2-3 weeks for post hospital visit and evaluation of volume status.   Discharge Diagnosis:   Principal Problem:   Acute respiratory failure with hypoxia (HCC) Active Problems:   ESRD (end stage renal disease) on dialysis (HCC)   Volume overload   PAF (paroxysmal atrial fibrillation) (HCC)   Essential hypertension   Acute combined systolic and diastolic heart failure (HCC)    Pressure injury of skin   Dyslipidemia   History of CVA (cerebrovascular accident)   Chronic back pain    Discharge Condition: Improved.  Diet recommendation: Low sodium, heart healthy.  Carbohydrate-modified.  Regular.  Wound care: None.  Code status: Full.   History of Present Illness:   Valerie Terrell is a 86 y.o. female with medical history significant of chronic back pain; CVA; pacemaker placement; afib, not on AC due to falls; HTN; HLD; ESRD on HD; and chronic combined CHF presented 3/31 with SOB.  She had progressive LE edema for 2-3 weeks.  Her right leg is weeping fluids.  They met with Dr. Lorrene Reid and the plan was to increase HD to 4 times a week.  She had been weak and SOB.  Hypoxia to 88% with sleep here.  They were in ED 6 days prior with volume overload and discharged.  They return with the same concern.  She had HD day prior but continued to be volume overload.  She had problems with low BP during HD.  Also with chronic back pain, hard to sit for prolonged HD sessions.     Hospital Course by Problem:   #1.  Acute respiratory failure with hypoxia/volume overload in the setting of end-stage renal disease on dialysis.  Resolved at discharge.  Oxygen  saturation level noted to be 75% on room air at rest while sleeping in the emergency department. At discharge oxygen saturatin leel 96% on room air. Had dialysis day prior to discharge and day of discharge. Evaluated by nephrology as an outpatient who recommended 4-day dialysis schedule but this has been unable to be obtained.  Nephrology input indicated that patient's dry weight  difficult to obtain due to low blood pressure and patient discomfort. Her new dialysis schedule MTTS has been confirmed.  #2.  End-stage renal disease on dialysis.  See #1.  Creatinine 1.7 this morning down from 3.54 days ago.  New 4 day a week dialysis schedule confirmed  #3.  Atrial fibrillation not on anticoagulation due to frequent falls.  Chart review indicates patient has a pacemaker and rate is controlled with amiodarone.  Rate controlled  #4.  Hypertension.  Fair control.  Home medications include Coreg and amiodarone. Have stopped coreg due to hypotension (patient reports she stopped it several weeks ago) Continuing amiodarone  #5.  Chronic combined CHF.  Echo done in 2019 reveals a preserved EF with grade 2 diastolic dysfunction.  See #1.Dialysis as noted above   Medical Consultants:   [facility]   Discharge Exam:   Vitals:   08/17/19 1400 08/17/19 1430  BP: (!) 118/50 (!) 98/38  Pulse: 72 74  Resp:    Temp:    SpO2:     Vitals:   08/17/19 1300 08/17/19 1330  08/17/19 1400 08/17/19 1430  BP: (!) 113/47 (!) 106/53 (!) 118/50 (!) 98/38  Pulse: 72 75 72 74  Resp:      Temp:      TempSrc:      SpO2:      Weight:      Height:        General exam: Appears slightly irritable but no acute distress Respiratory system: no increased work of breathing with conversation Cardiovascular system: S1 & S2 heard, RRR. No JVD,  rubs, gallops or clicks. No murmurs. 1+ LE edema bilaterally no weeping Gastrointestinal system: Abdomen is nondistended, soft and nontender. No organomegaly or masses felt.  Normal bowel sounds heard. Central nervous system: Alert and oriented. No focal neurological deficits. Extremities: No clubbing,  or cyanosis. No edema. Skin: No rashes, lesions or ulcers. Psychiatry: Judgement and insight appear normal. Mood & affect appropriate.    The results of significant diagnostics from this hospitalization (including imaging, microbiology, ancillary and laboratory) are listed below for reference.     Procedures and Diagnostic Studies:   DG Chest 2 View  Result Date: 08/16/2019 CLINICAL DATA:  Shortness of breath EXAM: CHEST - 2 VIEW COMPARISON:  08/11/2019 FINDINGS: The heart is enlarged but stable. Stable tortuosity and calcification of the thoracic aorta. The pacer wires are stable. Low lung volumes with vascular crowding and streaky basilar atelectasis. No focal infiltrates or pulmonary edema. Stable scoliosis and evidence of prior thoracic vertebral augmentation. IMPRESSION: Low lung volumes with vascular crowding and streaky basilar atelectasis. No infiltrates or edema. Electronically Signed   By: Marijo Sanes M.D.   On: 08/16/2019 05:37     Labs:   Basic Metabolic Panel: Recent Labs  Lab 08/11/19 0929 08/11/19 0929 08/16/19 0447 08/17/19 0217  NA 138  --  133* 140  K 4.3   < > 4.5 4.0  CL 92*  --  92* 99  CO2 28  --  29 32  GLUCOSE 93  --  82 79  BUN 11  --  9 <5*  CREATININE 3.57*  --  2.96* 1.71*  CALCIUM 8.3*  --  8.0* 8.1*   < > = values in this interval not displayed.   GFR Estimated Creatinine Clearance: 13.7 mL/min (A) (by C-G formula based on SCr of 1.71 mg/dL (H)). Liver Function Tests: Recent Labs  Lab 08/11/19 0929 08/16/19 0447  AST 23 24  ALT 15 12  ALKPHOS 87 88  BILITOT 0.6 0.3  PROT 5.8* 6.6  ALBUMIN 2.3* 2.5*   No results for input(s): LIPASE, AMYLASE in the last 168 hours. No results for input(s): AMMONIA in the last 168 hours. Coagulation profile No results for input(s): INR, PROTIME in the last 168  hours.  CBC: Recent Labs  Lab 08/11/19 0929 08/16/19 0447 08/17/19 0217  WBC 8.0 6.6 6.5  NEUTROABS 5.8 4.2  --   HGB 12.3 12.4 10.7*  HCT 40.7 40.3 34.5*  MCV 101.8* 99.8 101.5*  PLT 176 227 205   Cardiac Enzymes: No results for input(s): CKTOTAL, CKMB, CKMBINDEX, TROPONINI in the last 168 hours. BNP: Invalid input(s): POCBNP CBG: Recent Labs  Lab 08/11/19 0825  GLUCAP 105*   D-Dimer No results for input(s): DDIMER in the last 72 hours. Hgb A1c No results for input(s): HGBA1C in the last 72 hours. Lipid Profile No results for input(s): CHOL, HDL, LDLCALC, TRIG, CHOLHDL, LDLDIRECT in the last 72 hours. Thyroid function studies No results for input(s): TSH, T4TOTAL, T3FREE, THYROIDAB in the last 72  hours.  Invalid input(s): FREET3 Anemia work up No results for input(s): VITAMINB12, FOLATE, FERRITIN, TIBC, IRON, RETICCTPCT in the last 72 hours. Microbiology Recent Results (from the past 240 hour(s))  SARS CORONAVIRUS 2 (TAT 6-24 HRS) Nasopharyngeal Nasopharyngeal Swab     Status: None   Collection Time: 08/16/19 11:29 AM   Specimen: Nasopharyngeal Swab  Result Value Ref Range Status   SARS Coronavirus 2 NEGATIVE NEGATIVE Final    Comment: (NOTE) SARS-CoV-2 target nucleic acids are NOT DETECTED. The SARS-CoV-2 RNA is generally detectable in upper and lower respiratory specimens during the acute phase of infection. Negative results do not preclude SARS-CoV-2 infection, do not rule out co-infections with other pathogens, and should not be used as the sole basis for treatment or other patient management decisions. Negative results must be combined with clinical observations, patient history, and epidemiological information. The expected result is Negative. Fact Sheet for Patients: SugarRoll.be Fact Sheet for Healthcare Providers: https://www.woods-mathews.com/ This test is not yet approved or cleared by the [place] FDA  and  has been authorized for detection and/or diagnosis of SARS-CoV-2 by FDA under an Emergency Use Authorization (EUA). This EUA will remain  in effect (meaning this test can be used) for the duration of the COVID-19 declaration under Section 56 4(b)(1) of the Act, 21 U.S.C. section 360bbb-3(b)(1), unless the authorization is terminated or revoked sooner. Performed at [facility], [place] [address], [place], [place] [address]   Respiratory Panel by RT PCR (Flu A&B, Covid) -     Status: None   Collection Time: 08/16/19 11:29 AM  Result Value Ref Range Status   SARS Coronavirus 2 by RT PCR NEGATIVE NEGATIVE Final    Comment: (NOTE) SARS-CoV-2 target nucleic acids are NOT DETECTED. The SARS-CoV-2 RNA is generally detectable in upper respiratoy specimens during the acute phase of infection. The lowest concentration of SARS-CoV-2 viral copies this assay can detect is 131 copies/mL. A negative result does not preclude SARS-Cov-2 infection and should not be used as the sole basis for treatment or other patient management decisions. A negative result may occur with  improper specimen collection/handling, submission of specimen other than nasopharyngeal swab, presence of viral mutation(s) within the areas targeted by this assay, and inadequate number of viral copies (<131 copies/mL). A negative result must be combined with clinical observations, patient history, and epidemiological information. The expected result is Negative. Fact Sheet for Patients:  PinkCheek.be Fact Sheet for Healthcare Providers:  GravelBags.it This test is not yet ap proved or cleared by the [place] FDA and  has been authorized for detection and/or diagnosis of SARS-CoV-2 by FDA under an Emergency Use Authorization (EUA). This EUA will remain  in effect (meaning this test can be used) for the duration of the COVID-19 declaration under Section  564(b)(1) of the Act, 21 U.S.C. section 360bbb-3(b)(1), unless the authorization is terminated or revoked sooner.    Influenza A by PCR NEGATIVE NEGATIVE Final   Influenza B by PCR NEGATIVE NEGATIVE Final    Comment: (NOTE) The Xpert Xpress SARS-CoV-2/FLU/RSV assay is intended as an aid in  the diagnosis of influenza from Nasopharyngeal swab specimens and  should not be used as a sole basis for treatment. Nasal washings and  aspirates are unacceptable for Xpert Xpress SARS-CoV-2/FLU/RSV  testing. Fact Sheet for Patients: PinkCheek.be Fact Sheet for Healthcare Providers: GravelBags.it This test is not yet approved or cleared by the [place] FDA and  has been authorized for detection and/or diagnosis of SARS-CoV-2 by  FDA under an Emergency Use Authorization (EUA). This EUA will remain  in effect (meaning this test can be used) for the duration of the  Covid-19 declaration under Section 564(b)(1) of the Act, 21  U.S.C. section 360bbb-3(b)(1), unless the authorization is  terminated or revoked. Performed at [facility], [place] [address], [place], [place] [address]      Discharge Instructions:   Discharge Instructions    Call MD for:  difficulty breathing, headache or visual disturbances   Complete by: As directed    Call MD for:  persistant dizziness or light-headedness   Complete by: As directed    Call MD for:  temperature >100.4   Complete by: As directed    Diet - low sodium heart healthy   Complete by: As directed    Discharge instructions   Complete by: As directed    Follow new 4 days a week dialysis schedule Follow up with nephrology 2-3 weeks   Increase activity slowly   Complete by: As directed      Allergies as of 08/17/2019      Reactions   Clonidine Derivatives Palpitations, Other (See Comments)   VERY SEDATED   Adhesive [tape] Other (See Comments)   Tears skin up   Codeine Nausea Only    Nickel Rash   Sulfa Antibiotics Other (See Comments)   Reaction unknown Did not feel well when taken Reaction unknown      Medication List    STOP taking these medications   carvedilol 3.125 MG tablet Commonly known as: COREG     TAKE these medications   acetaminophen 500 MG tablet Commonly known as: TYLENOL Take 500-1,000 mg 3 (three) times daily as needed by mouth for moderate pain.   amiodarone 200 MG tablet Commonly known as: PACERONE TAKE 1 TABLET BY MOUTH  DAILY   aspirin EC 325 MG tablet Take 325 mg by mouth every evening.   b complex-vitamin c-folic acid 0.8 MG Tabs tablet Take 1 tablet by mouth See admin instructions. Take one tablet by mouth on Tuesday, Thursday, Saturday - after dialysis   calcitonin (salmon) 200 UNIT/ACT nasal spray Commonly known as: MIACALCIN/FORTICAL Place 1 spray into alternate nostrils daily.   Denta 5000 Plus 1.1 % Crea dental cream Generic drug: sodium fluoride Place 1 application onto teeth at bedtime.   docusate sodium 100 MG capsule Commonly known as: COLACE Take 100 mg by mouth daily as needed for mild constipation.   doxercalciferol 2.5 MCG capsule Commonly known as: HECTOROL Take 2.5 mcg by mouth 3 (three) times a week.   fentaNYL 50 MCG/HR Commonly known as: Galva 1 patch onto the skin every 3 (three) days.   heparin 1000 unit/mL Soln injection 1,000 Units by Dialysis route one time in dialysis.   isosorbide mononitrate 60 MG 24 hr tablet Commonly known as: IMDUR TAKE 1 TABLET BY MOUTH  DAILY ; TAKE 1 TABLET ON  SUN, MON, WED, FRI MORNINGS AND 1 TABLET ON TUES, THU,  SAT AFTER DIALYSIS What changed: See the new instructions.   Lidocaine 4 % Ptch Apply 1 patch topically daily as needed (Mild pain).   lidocaine-prilocaine cream Commonly known as: EMLA Apply 1 application topically See admin instructions. Apply small amount to access site (AVF) one hour before dialysis, cover with occlusive dressing (saran  wrap)   multivitamin with minerals tablet Take 1 tablet by mouth at bedtime.   naloxone 0.4 MG/ML injection Commonly known as: NARCAN Inject IM as needed for  reversal of opioid pain medication What changed:   how much to take  how to take this  when to take this   ondansetron 4 MG tablet Commonly known as: ZOFRAN Take 1 tablet (4 mg total) by mouth every 8 (eight) hours as needed for nausea or vomiting.   Oxycodone HCl 10 MG Tabs 1-2 tabs po q6h prn severe pain What changed:   how much to take  how to take this  when to take this  reasons to take this  additional instructions   polyvinyl alcohol 1.4 % ophthalmic solution Commonly known as: LIQUIFILM TEARS Place 1-2 drops into both eyes daily as needed for dry eyes.   pravastatin 40 MG tablet Commonly known as: PRAVACHOL TAKE 1 TABLET BY MOUTH  EVERY EVENING What changed: when to take this   sevelamer carbonate 800 MG tablet Commonly known as: RENVELA Take 800-2,400 mg by mouth See admin instructions. Take two - three tablets (1600 mg - 2400 mg) by mouth up to three times daily with meals, take one tablet (800 mg) with snacks   vitamin C 500 MG tablet Commonly known as: ASCORBIC ACID Take 500 mg by mouth daily.   Vitamin D (Ergocalciferol) 1.25 MG (50000 UNIT) Caps capsule Commonly known as: DRISDOL Take 1 capsule (50,000 Units total) by mouth every Thursday.         Time coordinating discharge: 40 minutes  Signed:  Radene Gunning NP  [facility] 08/17/2019, 2:46 PM

## 2019-08-17 NOTE — Progress Notes (Addendum)
Progress Note    Valerie Terrell  MPN:[id number] DOB: 1932-06-17  DOA: 08/16/2019 PCP: Tammi Sou, MD    Brief Narrative:   Chief complaint: Shortness of breath/volume overload  Medical records reviewed and are as summarized below:  Valerie Terrell is an 86 y.o. female with a past medical history significant for chronic back pain, CVA, pacemaker placement, A. fib not on anticoagulation due to falls, end-stage renal disease on dialysis since 2018, chronic combined CHF presented to the emergency department March 31 chief complaint of shortness of breath and progressive worsening lower extremity edema over the last 3 weeks.  Evaluated by nephrology as an outpatient and the plan was for her to go to dialysis 4 days a week but this has been unable to happen due to scheduling conflicts.  Work-up in the emergency department revealed severe peripheral edema and imaging concerning for pulmonary edema, hypoxia with an oxygen saturation level of 75%.  Evaluated by nephrology recommended dialysis  Assessment/Plan:   Principal Problem:   Acute respiratory failure with hypoxia ([place]) Active Problems:   ESRD (end stage renal disease) on dialysis (HCC)   Volume overload   PAF (paroxysmal atrial fibrillation) (HCC)   Essential hypertension   Acute combined systolic and diastolic heart failure (HCC)    Pressure injury of skin   Dyslipidemia   History of CVA (cerebrovascular accident)   Chronic back pain   #1.  Acute respiratory failure with hypoxia/volume overload in the setting of end-stage renal disease on dialysis.  Oxygen saturation level noted to be 75% on room air at rest while sleeping in the emergency department.  By nephrology as an outpatient who recommended 4-day dialysis schedule but this has been unable to be obtained.  Nephrology input indicates that patient's dry weight is been difficult to obtain due to low blood pressure and patient discomfort.  She was dialyzed yesterday and  scheduled for dialysis today.  Nephrology note she is 5 kg over her dry weight mission -Dialysis per nephrology -Monitor oxygen saturation level -Oxygen supplementation -Wean oxygen as able  #2.  End-stage renal disease on dialysis.  See #1.  Creatinine 1.7 this morning down from 3.54 days ago.  Evaluated by nephrology.  Their note indicates once her 4 days a week schedule is confirmed she can be dialyzed today and go home from their standpoint.  Yesterday she was 5 kg over her dry weight and today she is 3 kg over her dry weight in spite of dialysis yesterday.  Patient has proven to be not able to tolerate a full dialysis session. -Dialysis per nephrology  #3.  Atrial fibrillation not on anticoagulation due to frequent falls.  Chart review indicates patient has a pacemaker and rate is controlled with amiodarone.  Rate controlled -Continue home meds  #4.  Hypertension.  Fair control.  Home medications include Coreg and amiodarone -Holding Coreg -Continuing amiodarone  #5.  Chronic combined CHF.  Echo done in 2019 reveals a preserved EF with grade 2 diastolic dysfunction.  See #1. -Dialysis as noted above -Intake and output -daily weights   Family Communication/Anticipated D/C date and plan/Code Status   DVT prophylaxis: Lovenox ordered. Code Status: Full Code.  Family Communication: daughter at bedside Disposition Plan: likey home when OP HD schedule confirmed and oxygen requirements determined. Requesting PT eval for any needs   Medical Consultants:    [facility] nephrology   Anti-Infectives:    None  Subjective:   Seen up in chair looking unhappy.  Complains  of neck pain.  Reports her breathing is a little better  Objective:    Vitals:   08/16/19 2250 08/17/19 0537 08/17/19 0858 08/17/19 1200  BP: (!) 114/46 (!) 113/46 (!) 112/46 (!) 129/56  Pulse: 73 66 68 70  Resp:   16   Temp: 98.4 F (36.9 C) 98 F (36.7 C) 98.2 F (36.8 C)   TempSrc: Oral Oral Oral   SpO2:  93% 90% 95%   Weight:      Height:        Intake/Output Summary (Last 24 hours) at 08/17/2019 1214 Last data filed at 08/17/2019 0900 Gross per 24 hour  Intake 660 ml  Output 1313 ml  Net -653 ml   Filed Weights   08/16/19 0434 08/16/19 1829 08/16/19 2203  Weight: 60 kg 55.5 kg 54.2 kg    Exam: General: Awake alert sitting up in chair no acute distress CV: Irregularly irregular no murmur gallop or rub 1+ lower extremity edema bilaterally Respiratory: No increased work of breathing with conversation breath sounds are distant hear no wheezes no rhonchi Abdomen: Obese soft nondistended positive bowel sounds throughout nontender to palpation Musculoskeletal joints without swelling/erythema full range of motion Neuro: Alert and oriented x3 speech clear facial symmetry  Data Reviewed:   I have personally reviewed following labs and imaging studies:  Labs: Labs show the following:   Basic Metabolic Panel: Recent Labs  Lab 08/11/19 0929 08/11/19 0929 08/16/19 0447 08/17/19 0217  NA 138  --  133* 140  K 4.3   < > 4.5 4.0  CL 92*  --  92* 99  CO2 28  --  29 32  GLUCOSE 93  --  82 79  BUN 11  --  9 <5*  CREATININE 3.57*  --  2.96* 1.71*  CALCIUM 8.3*  --  8.0* 8.1*   < > = values in this interval not displayed.   GFR Estimated Creatinine Clearance: 13.6 mL/min (A) (by C-G formula based on SCr of 1.71 mg/dL (H)). Liver Function Tests: Recent Labs  Lab 08/11/19 0929 08/16/19 0447  AST 23 24  ALT 15 12  ALKPHOS 87 88  BILITOT 0.6 0.3  PROT 5.8* 6.6  ALBUMIN 2.3* 2.5*   No results for input(s): LIPASE, AMYLASE in the last 168 hours. No results for input(s): AMMONIA in the last 168 hours. Coagulation profile No results for input(s): INR, PROTIME in the last 168 hours.  CBC: Recent Labs  Lab 08/11/19 0929 08/16/19 0447 08/17/19 0217  WBC 8.0 6.6 6.5  NEUTROABS 5.8 4.2  --   HGB 12.3 12.4 10.7*  HCT 40.7 40.3 34.5*  MCV 101.8* 99.8 101.5*  PLT 176 227 205    Cardiac Enzymes: No results for input(s): CKTOTAL, CKMB, CKMBINDEX, TROPONINI in the last 168 hours. BNP (last 3 results) No results for input(s): PROBNP in the last 8760 hours. CBG: Recent Labs  Lab 08/11/19 0825  GLUCAP 105*   D-Dimer: No results for input(s): DDIMER in the last 72 hours. Hgb A1c: No results for input(s): HGBA1C in the last 72 hours. Lipid Profile: No results for input(s): CHOL, HDL, LDLCALC, TRIG, CHOLHDL, LDLDIRECT in the last 72 hours. Thyroid function studies: No results for input(s): TSH, T4TOTAL, T3FREE, THYROIDAB in the last 72 hours.  Invalid input(s): FREET3 Anemia work up: No results for input(s): VITAMINB12, FOLATE, FERRITIN, TIBC, IRON, RETICCTPCT in the last 72 hours. Sepsis Labs: Recent Labs  Lab 08/11/19 0929 08/16/19 0447 08/17/19 0217  WBC 8.0 6.6 6.5  Microbiology Recent Results (from the past 240 hour(s))  SARS CORONAVIRUS 2 (TAT 6-24 HRS) Nasopharyngeal Nasopharyngeal Swab     Status: None   Collection Time: 08/16/19 11:29 AM   Specimen: Nasopharyngeal Swab  Result Value Ref Range Status   SARS Coronavirus 2 NEGATIVE NEGATIVE Final    Comment: (NOTE) SARS-CoV-2 target nucleic acids are NOT DETECTED. The SARS-CoV-2 RNA is generally detectable in upper and lower respiratory specimens during the acute phase of infection. Negative results do not preclude SARS-CoV-2 infection, do not rule out co-infections with other pathogens, and should not be used as the sole basis for treatment or other patient management decisions. Negative results must be combined with clinical observations, patient history, and epidemiological information. The expected result is Negative. Fact Sheet for Patients: SugarRoll.be Fact Sheet for Healthcare Providers: https://www.woods-mathews.com/ This test is not yet approved or cleared by the [place] FDA and  has been authorized for detection and/or diagnosis  of SARS-CoV-2 by FDA under an Emergency Use Authorization (EUA). This EUA will remain  in effect (meaning this test can be used) for the duration of the COVID-19 declaration under Section 56 4(b)(1) of the Act, 21 U.S.C. section 360bbb-3(b)(1), unless the authorization is terminated or revoked sooner. Performed at [facility], [place] [address] [place], [place] [address]   Respiratory Panel by RT PCR (Flu A&B, Covid) -     Status: None   Collection Time: 08/16/19 11:29 AM  Result Value Ref Range Status   SARS Coronavirus 2 by RT PCR NEGATIVE NEGATIVE Final    Comment: (NOTE) SARS-CoV-2 target nucleic acids are NOT DETECTED. The SARS-CoV-2 RNA is generally detectable in upper respiratoy specimens during the acute phase of infection. The lowest concentration of SARS-CoV-2 viral copies this assay can detect is 131 copies/mL. A negative result does not preclude SARS-Cov-2 infection and should not be used as the sole basis for treatment or other patient management decisions. A negative result may occur with  improper specimen collection/handling, submission of specimen other than nasopharyngeal swab, presence of viral mutation(s) within the areas targeted by this assay, and inadequate number of viral copies (<131 copies/mL). A negative result must be combined with clinical observations, patient history, and epidemiological information. The expected result is Negative. Fact Sheet for Patients:  PinkCheek.be Fact Sheet for Healthcare Providers:  GravelBags.it This test is not yet ap proved or cleared by the [place] FDA and  has been authorized for detection and/or diagnosis of SARS-CoV-2 by FDA under an Emergency Use Authorization (EUA). This EUA will remain  in effect (meaning this test can be used) for the duration of the COVID-19 declaration under Section 564(b)(1) of the Act, 21 U.S.C. section 360bbb-3(b)(1), unless  the authorization is terminated or revoked sooner.    Influenza A by PCR NEGATIVE NEGATIVE Final   Influenza B by PCR NEGATIVE NEGATIVE Final    Comment: (NOTE) The Xpert Xpress SARS-CoV-2/FLU/RSV assay is intended as an aid in  the diagnosis of influenza from Nasopharyngeal swab specimens and  should not be used as a sole basis for treatment. Nasal washings and  aspirates are unacceptable for Xpert Xpress SARS-CoV-2/FLU/RSV  testing. Fact Sheet for Patients: PinkCheek.be Fact Sheet for Healthcare Providers: GravelBags.it This test is not yet approved or cleared by the [place] FDA and  has been authorized for detection and/or diagnosis of SARS-CoV-2 by  FDA under an Emergency Use Authorization (EUA). This EUA will remain  in effect (meaning this test can be used) for the  duration of the  Covid-19 declaration under Section 564(b)(1) of the Act, 21  U.S.C. section 360bbb-3(b)(1), unless the authorization is  terminated or revoked. Performed at [facility], [place] [address] [place], [place] [address]     Procedures and diagnostic studies:  DG Chest 2 View  Result Date: 08/16/2019 CLINICAL DATA:  Shortness of breath EXAM: CHEST - 2 VIEW COMPARISON:  08/11/2019 FINDINGS: The heart is enlarged but stable. Stable tortuosity and calcification of the thoracic aorta. The pacer wires are stable. Low lung volumes with vascular crowding and streaky basilar atelectasis. No focal infiltrates or pulmonary edema. Stable scoliosis and evidence of prior thoracic vertebral augmentation. IMPRESSION: Low lung volumes with vascular crowding and streaky basilar atelectasis. No infiltrates or edema. Electronically Signed   By: Marijo Sanes M.D.   On: 08/16/2019 05:37    Medications:   . amiodarone  200 mg Oral Daily  . aspirin EC  325 mg Oral QPM  . calcitonin (salmon)  1 spray Alternating Nares Daily  . Chlorhexidine Gluconate Cloth   6 each Topical Q0600  . Chlorhexidine Gluconate Cloth  6 each Topical Q0600  . doxercalciferol  3 mcg Intravenous Q T,Th,Sa-HD  . fentaNYL  1 patch Transdermal Q72H  . heparin  5,000 Units Subcutaneous Q8H  . isosorbide mononitrate  60 mg Oral Daily  . multivitamin  1 tablet Oral QHS  . pravastatin  40 mg Oral QHS  . sevelamer carbonate  1,600-2,400 mg Oral TID WC   And  . sevelamer carbonate  800 mg Oral With snacks  . sodium chloride flush  3 mL Intravenous Q12H   Continuous Infusions:   LOS: 0 days   Radene Gunning NP  [facility]   How to contact the [facility] Attending or Consulting provider [place] or covering provider during after hours [place], for this patient?  1. Check the care team in [facility] and look for a) attending/consulting TR[facility] provider listed and b) the [facility] team listed 2. Log into [email or web address] and use [place]'s universal password to access. If you do not have the password, please contact the hospital operator. 3. Locate the [facility] provider you are looking for under [facility] and page to a number that you can be directly reached. 4. If you still have difficulty reaching the provider, please page the [facility] (Director on Call) for the Hospitalists listed on amion for assistance.  08/17/2019, 12:14 PM

## 2019-08-17 NOTE — Progress Notes (Addendum)
[facility] Progress Note  Name: Valerie Terrell MRN: [id number] DOB: 08-Sep-1932  Chief Complaint:  Shortness of breath  Subjective:  Had HD yesterday with 1.3 kg UF.  Feels like her breathing is much better.  Review of systems:  Shortness of breath much better. No n/v Denies cp ---- Background on consult:  Valerie Terrell is an end-stage renal disease patient on dialysis Tuesday Thursday Saturday who presented to the hospital with shortness of breath and fluid overload.  She states that she has had progressive symptoms and that this gets worse with exertion.  At dialysis she has had difficulty reaching her dry weight and they have recommended going to treatment 4 times a week however they have not been able to schedule.  Chair time for the fourth day at this time.  She left 3 kg over her dry weight her last treatment on 3/30.  She has 5 kg over her dry weight today on 3/31.    Intake/Output Summary (Last 24 hours) at 08/17/2019 1133 Last data filed at 08/17/2019 0900 Gross per 24 hour  Intake 660 ml  Output 1313 ml  Net -653 ml    Vitals:  Vitals:   08/16/19 2203 08/16/19 2250 08/17/19 0537 08/17/19 0858  BP: (!) 131/54 (!) 114/46 (!) 113/46 (!) 112/46  Pulse: 75 73 66 68  Resp: 19   16  Temp: 98 F (36.7 C) 98.4 F (36.9 C) 98 F (36.7 C) 98.2 F (36.8 C)  TempSrc: Oral Oral Oral Oral  SpO2:  93% 90% 95%  Weight: 54.2 kg     Height:         Physical Exam:  General: Elderly female, alert, nad  Head: NCAT Lungs: unlabored at rest  Heart: RRR with S1 S2 Abdomen: soft non-tender; non-distended  Lower extremities: 2+ pitting edema to knees  Neuro: A & O  X 3. Moves all extremities spontaneously. Psych:  Responds to questions appropriately with a normal affect. Dialysis Access: RUE AVG in use  Medications reviewed    Labs:  BMP Latest Ref Rng & Units 08/17/2019 08/16/2019 08/11/2019  Glucose 70 - 99 mg/dL 79 82 93  BUN 8 - 23 mg/dL <5(L) 9 11  Creatinine  0.44 - 1.00 mg/dL 1.71(H) 2.96(H) 3.57(H)  BUN/Creat Ratio 12 - 28 - - -  Sodium 135 - 145 mmol/L 140 133(L) 138  Potassium 3.5 - 5.1 mmol/L 4.0 4.5 4.3  Chloride 98 - 111 mmol/L 99 92(L) 92(L)  CO2 22 - 32 mmol/L 32 29 28  Calcium 8.9 - 10.3 mg/dL 8.1(L) 8.0(L) 8.3(L)   Orders for outpatient:  [facility]S (but not able to get four days currently) 4h 350/600 EDW 51.5kg 2K/2Ca AVG Heparin 1400  Hectorol 3   Assessment/Plan:   # Dyspnea with volume overload.   - Serial HD for now.  Had HD on 3/31 - HD today and per TTS schedule and will assess additional needs daily.  # End-stage renal disease - prescribed MTTS for outpatient HD but not able to receive currently - Optimize volume as below - Changed to UF only treatment today, 4/1 - Needs 4 days a week treatment and her clinic is trying to schedule this.  Spoke with HD coordinator and she is contacting the clinic re: same as recently was an issue.  Once has 4 days a week HD secured she is able to be discharged from a renal standpoint  - Will use albumin today while inpatient to augment fluid removal  #  Hypertension - Blood pressure stable with report of blood pressure dropping on HD.  # Anemia of CKD.  - Most recently not on ESA and no acute indication for ESA.    # Metabolic bone disease - On Hectorol and Renvela outpatient  # Paroxysmal atrial fibrillation - per primary team   Claudia Desanctis, MD 08/17/2019  11:48 AM  Per outpatient HD unit, they do have a spot for her MTTS.  They are confirming the time with her for the Monday sessions but once this is confirmed she is able to be discharged from a renal standpoint.  HD coordinator is assisting - appreciate her time.   Claudia Desanctis 08/17/2019 12:01 PM

## 2019-08-17 NOTE — Progress Notes (Addendum)
Renal Navigator has confirmed that patient has been set up for a fourth HD treatment weekly at [facility]. Her schedule is [facility]S at 12:00pm per [facility] staff.  Renal Navigator informed Nephrologist/Dr. Royce Macadamia and patient. Patient requests that I call her daughter. Navigator also messaged Attending/Dr. Louanne Belton. Patient cleared for discharge. Daughter aware and states she will come to the hospital later this afternoon to pick up her mother. She is very appreciative of having 4th treatment confirmed at OP HD clinic.  Alphonzo Cruise, [place]  Renal Navigator [phone]

## 2019-08-17 NOTE — Plan of Care (Signed)
  Problem: Education: Goal: Knowledge of General Education information will improve Description Including pain rating scale, medication(s)/side effects and non-pharmacologic comfort measures Outcome: Progressing   

## 2019-08-17 NOTE — Progress Notes (Signed)
DISCHARGE NOTE HOME Valerie Terrell to be discharged to [facility] per MD order. Discussed prescriptions and follow up appointments with the patient. Prescriptions given to patient; medication list explained in detail. Patient verbalized understanding.  Skin clean, dry and intact without evidence of skin break down, no evidence of skin tears noted. IV catheter discontinued intact. Site without signs and symptoms of complications. Dressing and pressure applied. Pt denies pain at the site currently. No complaints noted.  Patient free of lines, drains, and wounds.   An After Visit Summary (AVS) was printed and given to the patient. Patient escorted via wheelchair, and discharged home via private auto.  [place], Zenon Mayo, RN

## 2019-08-17 NOTE — Plan of Care (Signed)
  Problem: Education: Goal: Knowledge of General Education information will improve Description: Including pain rating scale, medication(s)/side effects and non-pharmacologic comfort measures Outcome: Adequate for Discharge   Problem: Health Behavior/Discharge Planning: Goal: Ability to manage health-related needs will improve Outcome: Adequate for Discharge   Problem: Clinical Measurements: Goal: Ability to maintain clinical measurements within normal limits will improve Outcome: Adequate for Discharge   Problem: Activity: Goal: Risk for activity intolerance will decrease Outcome: Adequate for Discharge   Problem: Nutrition: Goal: Adequate nutrition will be maintained Outcome: Adequate for Discharge   Problem: Coping: Goal: Level of anxiety will decrease Outcome: Adequate for Discharge

## 2019-08-18 ENCOUNTER — Other Ambulatory Visit: Payer: Self-pay | Admitting: Family Medicine

## 2019-08-18 ENCOUNTER — Encounter: Payer: Self-pay | Admitting: Family Medicine

## 2019-08-18 ENCOUNTER — Telehealth: Payer: Self-pay | Admitting: Nurse Practitioner

## 2019-08-18 DIAGNOSIS — Z8673 Personal history of transient ischemic attack (TIA), and cerebral infarction without residual deficits: Secondary | ICD-10-CM | POA: Diagnosis not present

## 2019-08-18 DIAGNOSIS — I340 Nonrheumatic mitral (valve) insufficiency: Secondary | ICD-10-CM | POA: Diagnosis not present

## 2019-08-18 DIAGNOSIS — M5134 Other intervertebral disc degeneration, thoracic region: Secondary | ICD-10-CM | POA: Diagnosis not present

## 2019-08-18 DIAGNOSIS — I480 Paroxysmal atrial fibrillation: Secondary | ICD-10-CM | POA: Diagnosis not present

## 2019-08-18 DIAGNOSIS — N186 End stage renal disease: Secondary | ICD-10-CM | POA: Diagnosis not present

## 2019-08-18 DIAGNOSIS — I5043 Acute on chronic combined systolic (congestive) and diastolic (congestive) heart failure: Secondary | ICD-10-CM | POA: Diagnosis not present

## 2019-08-18 DIAGNOSIS — E785 Hyperlipidemia, unspecified: Secondary | ICD-10-CM | POA: Diagnosis not present

## 2019-08-18 DIAGNOSIS — J9611 Chronic respiratory failure with hypoxia: Secondary | ICD-10-CM | POA: Diagnosis not present

## 2019-08-18 DIAGNOSIS — I429 Cardiomyopathy, unspecified: Secondary | ICD-10-CM | POA: Diagnosis not present

## 2019-08-18 DIAGNOSIS — I132 Hypertensive heart and chronic kidney disease with heart failure and with stage 5 chronic kidney disease, or end stage renal disease: Secondary | ICD-10-CM | POA: Diagnosis not present

## 2019-08-18 DIAGNOSIS — Z950 Presence of cardiac pacemaker: Secondary | ICD-10-CM | POA: Diagnosis not present

## 2019-08-18 DIAGNOSIS — M810 Age-related osteoporosis without current pathological fracture: Secondary | ICD-10-CM | POA: Diagnosis not present

## 2019-08-18 DIAGNOSIS — M4726 Other spondylosis with radiculopathy, lumbar region: Secondary | ICD-10-CM | POA: Diagnosis not present

## 2019-08-18 DIAGNOSIS — Z86010 Personal history of colonic polyps: Secondary | ICD-10-CM | POA: Diagnosis not present

## 2019-08-18 DIAGNOSIS — Z9181 History of falling: Secondary | ICD-10-CM | POA: Diagnosis not present

## 2019-08-18 DIAGNOSIS — I495 Sick sinus syndrome: Secondary | ICD-10-CM | POA: Diagnosis not present

## 2019-08-18 DIAGNOSIS — Z87891 Personal history of nicotine dependence: Secondary | ICD-10-CM | POA: Diagnosis not present

## 2019-08-18 DIAGNOSIS — M48061 Spinal stenosis, lumbar region without neurogenic claudication: Secondary | ICD-10-CM | POA: Diagnosis not present

## 2019-08-18 DIAGNOSIS — D631 Anemia in chronic kidney disease: Secondary | ICD-10-CM | POA: Diagnosis not present

## 2019-08-18 DIAGNOSIS — G894 Chronic pain syndrome: Secondary | ICD-10-CM | POA: Diagnosis not present

## 2019-08-18 DIAGNOSIS — Z992 Dependence on renal dialysis: Secondary | ICD-10-CM | POA: Diagnosis not present

## 2019-08-18 NOTE — Telephone Encounter (Signed)
Transition of care contact from inpatient facility  Date of discharge: 08/17/2019 Date of contact: 08/18/2019 Method: Phone Spoke to: [place] daughter  Patient contacted to discuss transition of care from recent inpatient hospitalization. Patient was admitted to [facility] from 08/16/2019 to 08/17/2019 with discharge diagnosis of Acute respiratory failure with hypoxia, volume overload.  Medication changes were reviewed.  Patient will follow up with his/her outpatient HD unit on: 08/19/2019  Other f/u needs include: None. Has home PT/OT, has upcoming appt with palliative care.

## 2019-08-19 DIAGNOSIS — E8770 Fluid overload, unspecified: Secondary | ICD-10-CM | POA: Diagnosis not present

## 2019-08-19 DIAGNOSIS — D689 Coagulation defect, unspecified: Secondary | ICD-10-CM | POA: Diagnosis not present

## 2019-08-19 DIAGNOSIS — Z992 Dependence on renal dialysis: Secondary | ICD-10-CM | POA: Diagnosis not present

## 2019-08-19 DIAGNOSIS — E876 Hypokalemia: Secondary | ICD-10-CM | POA: Diagnosis not present

## 2019-08-19 DIAGNOSIS — N2581 Secondary hyperparathyroidism of renal origin: Secondary | ICD-10-CM | POA: Diagnosis not present

## 2019-08-19 DIAGNOSIS — N186 End stage renal disease: Secondary | ICD-10-CM | POA: Diagnosis not present

## 2019-08-21 ENCOUNTER — Telehealth: Payer: Self-pay

## 2019-08-21 DIAGNOSIS — E876 Hypokalemia: Secondary | ICD-10-CM | POA: Diagnosis not present

## 2019-08-21 DIAGNOSIS — D689 Coagulation defect, unspecified: Secondary | ICD-10-CM | POA: Diagnosis not present

## 2019-08-21 DIAGNOSIS — N2581 Secondary hyperparathyroidism of renal origin: Secondary | ICD-10-CM | POA: Diagnosis not present

## 2019-08-21 DIAGNOSIS — Z992 Dependence on renal dialysis: Secondary | ICD-10-CM | POA: Diagnosis not present

## 2019-08-21 DIAGNOSIS — I120 Hypertensive chronic kidney disease with stage 5 chronic kidney disease or end stage renal disease: Secondary | ICD-10-CM | POA: Diagnosis not present

## 2019-08-21 DIAGNOSIS — E8770 Fluid overload, unspecified: Secondary | ICD-10-CM | POA: Diagnosis not present

## 2019-08-21 DIAGNOSIS — N186 End stage renal disease: Secondary | ICD-10-CM | POA: Diagnosis not present

## 2019-08-21 MED ORDER — FENTANYL 50 MCG/HR TD PT72: 1.0000 | MEDICATED_PATCH | TRANSDERMAL | 0 refills | Status: DC

## 2019-08-21 MED ORDER — OXYCODONE HCL 10 MG PO TABS: ORAL_TABLET | ORAL | 0 refills | Status: DC

## 2019-08-21 NOTE — Telephone Encounter (Signed)
Requesting:oxycodone Contract:n/a UDS:n/a Last Visit:08/02/19 Next Visit:advised to f/u 4 weeks Last Refill:07/07/19(90,0)  Please Advise

## 2019-08-21 NOTE — Telephone Encounter (Signed)
Spoke with patients daughter, [name].   Transition Care Management Follow-up Telephone Call  Admission: 08/16/2019-08/17/2019   How have you been since you were released from the hospital? "She's weak" Denies SOB currently   Do you understand why you were in the hospital? yes, fluid overload. Patient is now scheduled for dialysis 4x/week.    Do you understand the discharge instructions? yes   Where were you discharged to? Home. Resides with daughter.    Items Reviewed:  Medications reviewed: yes, Carvedilol discontinued. Patient has not taken in some time d/t low BP  Allergies reviewed: yes  Dietary changes reviewed: yes  Referrals reviewed: yes   Functional Questionnaire:   Activities of Daily Living (ADLs):   She states they are independent in the following: ambulation, continence and toileting States they require assistance with the following: bathing and hygiene, grooming and dressing   Patient requires assistance/person present for all ADLs. PT 2 days/week. Palliative care assessment on Friday (08/25/19)   Any transportation issues/concerns?: no   Any patient concerns? Yes. Daughter requesting refill for fentanyl patch (request sent in separate phone encounter)   Confirmed importance and date/time of follow-up visits scheduled yes  Provider Appointment booked with PCP on 08/30/2019 virtually with daughter.   Confirmed with patient if condition begins to worsen call PCP or go to the ER.  Patient was given the office number and encouraged to call back with question or concerns.  : yes

## 2019-08-21 NOTE — Telephone Encounter (Signed)
Requesting:fentanyl Contract:n/a UDS:n/a Last Visit:08/02/19 Next Visit:08/30/19 Last Refill:08/02/19(5,0)  Please Advise. Medication pending

## 2019-08-21 NOTE — Telephone Encounter (Signed)
Noted: nurse phone contact with patient for TCM. Signed:  Crissie Sickles, MD           08/21/2019

## 2019-08-21 NOTE — Telephone Encounter (Signed)
OK, oxycodone erx'd

## 2019-08-21 NOTE — Telephone Encounter (Signed)
RX sent. Patient notified via [place].

## 2019-08-22 DIAGNOSIS — N186 End stage renal disease: Secondary | ICD-10-CM | POA: Diagnosis not present

## 2019-08-22 DIAGNOSIS — D689 Coagulation defect, unspecified: Secondary | ICD-10-CM | POA: Diagnosis not present

## 2019-08-22 DIAGNOSIS — E876 Hypokalemia: Secondary | ICD-10-CM | POA: Diagnosis not present

## 2019-08-22 DIAGNOSIS — Z992 Dependence on renal dialysis: Secondary | ICD-10-CM | POA: Diagnosis not present

## 2019-08-22 DIAGNOSIS — N2581 Secondary hyperparathyroidism of renal origin: Secondary | ICD-10-CM | POA: Diagnosis not present

## 2019-08-22 DIAGNOSIS — E8770 Fluid overload, unspecified: Secondary | ICD-10-CM | POA: Diagnosis not present

## 2019-08-22 LAB — PROTIME-INR: Protime: 12.9 (ref 10.0–13.8)

## 2019-08-22 LAB — POCT INR: INR: 1 (ref 0.9–1.1)

## 2019-08-24 DIAGNOSIS — E8770 Fluid overload, unspecified: Secondary | ICD-10-CM | POA: Diagnosis not present

## 2019-08-24 DIAGNOSIS — N186 End stage renal disease: Secondary | ICD-10-CM | POA: Diagnosis not present

## 2019-08-24 DIAGNOSIS — Z992 Dependence on renal dialysis: Secondary | ICD-10-CM | POA: Diagnosis not present

## 2019-08-24 DIAGNOSIS — N2581 Secondary hyperparathyroidism of renal origin: Secondary | ICD-10-CM | POA: Diagnosis not present

## 2019-08-24 DIAGNOSIS — E876 Hypokalemia: Secondary | ICD-10-CM | POA: Diagnosis not present

## 2019-08-24 DIAGNOSIS — D689 Coagulation defect, unspecified: Secondary | ICD-10-CM | POA: Diagnosis not present

## 2019-08-25 ENCOUNTER — Other Ambulatory Visit: Payer: Self-pay

## 2019-08-25 ENCOUNTER — Other Ambulatory Visit: Payer: Medicare Other | Admitting: Internal Medicine

## 2019-08-25 DIAGNOSIS — Z515 Encounter for palliative care: Secondary | ICD-10-CM | POA: Diagnosis not present

## 2019-08-25 DIAGNOSIS — Z992 Dependence on renal dialysis: Secondary | ICD-10-CM

## 2019-08-25 DIAGNOSIS — N186 End stage renal disease: Secondary | ICD-10-CM | POA: Diagnosis not present

## 2019-08-25 NOTE — Progress Notes (Signed)
[facility] Consult Note Telephone: [phone]  Fax: [phone]  PATIENT NAME: Valerie Terrell DOB: 17-May-1933 MRN: [id number]  PRIMARY CARE PROVIDER:   Tammi Sou, MD  REFERRING PROVIDER:  Tammi Sou, MD [address] [place],  [place] [address]  RESPONSIBLE PARTY:   Paislie Tessler  to patient Home phone Work phone Mobile phone [place], [place] Daughter [phone]      RECOMMENDATIONS and PLAN:  Palliative Care Encounter Z51.5  1. Advance Care Planning:  Explanation of palliative and hospice care. Goals of care discussed which are to attempt to improve strength and mobility.  She will be receiving home physical therapy by [facility].  She would also like to continue hemodialysis as long as tolerated.  Advanced directives reviewed with current selections of CPR and full scope of treatment. Pt an daughter will discuss these choices.  Pt stated that she does not desire tube feedings.  MOST form will be readdressed on next visit  2.  Weakness:  Multifactorial from chronic debilitation, ESRD, fair nutritional intake, CHF.  Continue with plans for home physical therapy.  Phoned [facility] to request home health aid to assist with personal care(daughter is sole caregiver). Request will be made through occupational therapy. Supportive care.  3.  ESRD:  Continue hemodialysis if tolerated well.  Managed by nephrologist.  Discuss with social worker related to resources for transportation.   I spent 45  minutes providing this consultation,  from 1500 to 1540. More than 50% of the time in this consultation was spent coordinating communication with patient and her daughter.   HISTORY OF PRESENT ILLNESS:  [name] is a 86 y.o. year old female with multiple medical problems including CHF, afib/pacer controlled, ESRD,  on hemodialysis 4 days per day,spinal stenosis and fractures.  Pt and daughter report that  patient has difficulty with mobility related to spinal fractures and weakness.  She also is still recooperating from a hip fracture from 09/2018.  Daughter feels that she is getting weaker(ambulates with a walker) and requires more assistance at home with ADLs, meals, meds. She is patient's only caregiver and is being challenged with her daily needs. Reports periodic pauses of dialysis related to fluctuations of BP during treatment.   Palliative Care was asked to help address goals of care.   CODE STATUS: FULL CODE  PPS: 40% weak HOSPICE ELIGIBILITY/DIAGNOSIS: TBD  Currently receiving hemodialysis  PAST MEDICAL HISTORY:  Past Medical History:  Diagnosis Date  . Anemia of chronic disease 2018   + anemia of CRI?  [name] Atrophy of left kidney    with absent blood flow by renal artery dopplers (Dr. Gwenlyn Found)  . 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 Chronic combined systolic and diastolic CHF (congestive heart failure) ([facility])    a. 11/2016: echo showing EF of 35-40%, RV strain noted, mild MR and mild TR.   Echo showed much improved EF 12/2017 (55-60%)  . Chronic respiratory failure with hypoxia ([facility]) 02/11/2017   Now on chronic O2 (intermittent as of summer 2019).    . Closed fracture of right superior pubic ramus ([facility]) 07/18/2018   Non op mgmt (Dr. Victorino December was ortho who saw her in hosp but i don't think pt followed up with him).  . Closed right hip fracture ([facility]) 10/04/2018   ORIF  . Debilitated patient    WC dependent as of 2018.  Hosp bed + full  assistance with most ADLs s/p hip fx surg 09/2018.  [name] ESRD on hemodialysis ([facility]) 01/2017   T/Th/Sat schedule- [facility].  Right basilic AV fistula 01/6044.  Graft 2019.  [name] History of adenomatous polyp of colon   . History of kidney stones   . History of subarachnoid hemorrhage 10/2014   after syncope and while on xarelto  . Hyperlipidemia   . Hypertension    Difficult to control, in the setting of one functioning kidney: pt was  referred to nephrology by Dr. Gwenlyn Found 06/2015.  . Lumbar radiculopathy 2012  . Lumbar spinal stenosis 2019   facet injections only very short term relief.  L1 and L2 selective nerve root blocks helpful 04/2018.  . Lumbar spondylosis    MR 07/2016---no sign of spinal nerve compression or cord compression.  Pt set up with outpt ortho while admitted to hosp 07/2016.  . Malnourished ([facility])   . Metatarsal fracture 06/10/2016   Nondisplaced, left 5th metatarsal--pt was referred to ortho  . Osteopenia 2014   T-score -2.1  . PAF (paroxysmal atrial fibrillation) ([facility])    Eliquis started after BRAO and CVA.  She was changed to warfarin 2018. All anticoag stopped due to recurrent falls.  . Presence of permanent cardiac pacemaker   . Right rib fracture 12/2016   s/p fall  . Sick sinus syndrome ([facility]) 11/2016   Dual chamber pacer insertion 2018 (Dr. Lovena Le)  . Stroke ([facility]) 40/9811   cardioembolic (had CVA while on no anticoag)--"scattered subacute punctate infarcts: 1 in R parietal lobe and 2 in occipital cortex" on MRI br.  CT angio head/neck: aortic arch athero.  R ICA 20% stenosis, L ICA w/out any stenosis.  . Thoracic back pain 01/2017; 01/2018   2018: Facet?  Dr. Ramos->steroid injection.  01/2018-->CT T spine to check for a new comp fx-->none found.  Selective nerve root inj in T spine helpful on R side.   . Thoracic compression fracture ([facility]) 07/2016   T3.  T7 and T8-- T8 kyphoplasty during hosp admission 07/2016.  Neuro referred pt to pain mgmt for consideration of injection 09/2016.  I referred her to endo 08/2016 for consideration of calcitonin treatment.    SOCIAL HX:  Lives with daughter, retired Merchandiser, retail   ALLERGIES:  Allergies  Allergen Reactions  . Clonidine Derivatives Palpitations and Other (See Comments)    VERY SEDATED  . Adhesive [Tape] Other (See Comments)    Tears skin up  . Codeine Nausea Only  . Nickel Rash  . Sulfa Antibiotics Other (See Comments)    Reaction unknown Did  not feel well when taken Reaction unknown     PERTINENT MEDICATIONS:  Outpatient Encounter Medications as of 08/25/2019  Medication Sig  . acetaminophen (TYLENOL) 500 MG tablet Take 500-1,000 mg 3 (three) times daily as needed by mouth for moderate pain.   [name] amiodarone (PACERONE) 200 MG tablet TAKE 1 TABLET BY MOUTH  DAILY (Patient taking differently: Take 200 mg by mouth daily. )  . aspirin EC 325 MG tablet Take 325 mg by mouth every evening.  [name] b complex-vitamin c-folic acid (NEPHRO-VITE) 0.8 MG TABS tablet Take 1 tablet by mouth See admin instructions. Take one tablet by mouth on Tuesday, Thursday, Saturday - after dialysis  . calcitonin, salmon, (MIACALCIN/FORTICAL) 200 UNIT/ACT nasal spray Place 1 spray into alternate nostrils daily.   . DENTA 5000 PLUS 1.1 % CREA dental cream Place 1 application onto teeth at bedtime.   . docusate sodium (COLACE) 100 MG  capsule Take 100 mg by mouth daily as needed for mild constipation.   [name] doxercalciferol (HECTOROL) 2.5 MCG capsule Take 2.5 mcg by mouth 3 (three) times a week.   . fentaNYL (DURAGESIC) 50 MCG/HR Place 1 patch onto the skin every 3 (three) days.  . heparin 1000 unit/mL SOLN injection 1,000 Units by Dialysis route one time in dialysis.   [name] isosorbide mononitrate (IMDUR) 60 MG 24 hr tablet TAKE 1 TABLET BY MOUTH  DAILY ; TAKE 1 TABLET ON  SUN, MON, WED, FRI MORNINGS AND 1 TABLET ON TUES, THU,  SAT AFTER DIALYSIS (Patient taking differently: Take 60 mg by mouth daily. )  . Lidocaine 4 % PTCH Apply 1 patch topically daily as needed (Mild pain).  [name] lidocaine-prilocaine (EMLA) cream Apply 1 application topically See admin instructions. Apply small amount to access site (AVF) one hour before dialysis, cover with occlusive dressing (saran wrap)  . Multiple Vitamins-Minerals (MULTIVITAMIN WITH MINERALS) tablet Take 1 tablet by mouth at bedtime.   . naloxone (NARCAN) 0.4 MG/ML injection Inject IM as needed for reversal of opioid pain medication (Patient  taking differently: Inject 0.4 mg into the muscle See admin instructions. Inject IM as needed for reversal of opioid pain medication)  . ondansetron (ZOFRAN) 4 MG tablet Take 1 tablet (4 mg total) by mouth every 8 (eight) hours as needed for nausea or vomiting.  . Oxycodone HCl 10 MG TABS 1-2 tabs po q6h prn severe pain  . polyvinyl alcohol (LIQUIFILM TEARS) 1.4 % ophthalmic solution Place 1-2 drops into both eyes daily as needed for dry eyes.   . pravastatin (PRAVACHOL) 40 MG tablet TAKE 1 TABLET BY MOUTH  EVERY EVENING (Patient taking differently: Take 40 mg by mouth at bedtime. )  . sevelamer carbonate (RENVELA) 800 MG tablet Take 800-2,400 mg by mouth See admin instructions. Take two - three tablets (1600 mg - 2400 mg) by mouth up to three times daily with meals, take one tablet (800 mg) with snacks  . vitamin C (ASCORBIC ACID) 500 MG tablet Take 500 mg by mouth daily.  . Vitamin D, Ergocalciferol, (DRISDOL) 1.25 MG (50000 UT) CAPS capsule Take 1 capsule (50,000 Units total) by mouth every Thursday.   No facility-administered encounter medications on file as of 08/25/2019.    PHYSICAL EXAM:   General: NAD, frail elderly appearing elderly female sitting in chair Cardiovascular: regular rate and rhythm.  Pacemaker noted Pulmonary: scattered rails LL Abdomen: soft, nontender, + bowel sounds GU: no suprapubic tenderness Extremities: no edema  Skin: exposed skin intact Neurological:  Alert and oriented.  Slightly forgetful, weakness  [name], NP-C

## 2019-08-26 DIAGNOSIS — E8770 Fluid overload, unspecified: Secondary | ICD-10-CM | POA: Diagnosis not present

## 2019-08-26 DIAGNOSIS — N2581 Secondary hyperparathyroidism of renal origin: Secondary | ICD-10-CM | POA: Diagnosis not present

## 2019-08-26 DIAGNOSIS — E876 Hypokalemia: Secondary | ICD-10-CM | POA: Diagnosis not present

## 2019-08-26 DIAGNOSIS — D689 Coagulation defect, unspecified: Secondary | ICD-10-CM | POA: Diagnosis not present

## 2019-08-26 DIAGNOSIS — N186 End stage renal disease: Secondary | ICD-10-CM | POA: Diagnosis not present

## 2019-08-26 DIAGNOSIS — Z992 Dependence on renal dialysis: Secondary | ICD-10-CM | POA: Diagnosis not present

## 2019-08-28 DIAGNOSIS — E8770 Fluid overload, unspecified: Secondary | ICD-10-CM | POA: Diagnosis not present

## 2019-08-28 DIAGNOSIS — E876 Hypokalemia: Secondary | ICD-10-CM | POA: Diagnosis not present

## 2019-08-28 DIAGNOSIS — N2581 Secondary hyperparathyroidism of renal origin: Secondary | ICD-10-CM | POA: Diagnosis not present

## 2019-08-28 DIAGNOSIS — Z992 Dependence on renal dialysis: Secondary | ICD-10-CM | POA: Diagnosis not present

## 2019-08-28 DIAGNOSIS — N186 End stage renal disease: Secondary | ICD-10-CM | POA: Diagnosis not present

## 2019-08-28 DIAGNOSIS — D689 Coagulation defect, unspecified: Secondary | ICD-10-CM | POA: Diagnosis not present

## 2019-08-29 ENCOUNTER — Encounter: Payer: Self-pay | Admitting: Family Medicine

## 2019-08-29 ENCOUNTER — Other Ambulatory Visit: Payer: Self-pay

## 2019-08-29 DIAGNOSIS — N186 End stage renal disease: Secondary | ICD-10-CM | POA: Diagnosis not present

## 2019-08-29 DIAGNOSIS — E876 Hypokalemia: Secondary | ICD-10-CM | POA: Diagnosis not present

## 2019-08-29 DIAGNOSIS — Z992 Dependence on renal dialysis: Secondary | ICD-10-CM | POA: Diagnosis not present

## 2019-08-29 DIAGNOSIS — N2581 Secondary hyperparathyroidism of renal origin: Secondary | ICD-10-CM | POA: Diagnosis not present

## 2019-08-29 DIAGNOSIS — D689 Coagulation defect, unspecified: Secondary | ICD-10-CM | POA: Diagnosis not present

## 2019-08-29 DIAGNOSIS — E8770 Fluid overload, unspecified: Secondary | ICD-10-CM | POA: Diagnosis not present

## 2019-08-30 ENCOUNTER — Telehealth: Payer: Self-pay

## 2019-08-30 ENCOUNTER — Telehealth: Payer: Medicare Other | Admitting: Family Medicine

## 2019-08-30 NOTE — Telephone Encounter (Signed)
Pt's daughter originally contacted to r/s appt from today and she had concerns regarding mom's back pain. She is taking oxycodone and fentanyl as directed but would be open to other suggestions you may have. The pain is becoming unbearable, pt refuses to continue going to ED for this issue. Pt's daughter is aware PCP out of office today.  Please advise, thanks.

## 2019-08-31 ENCOUNTER — Emergency Department (HOSPITAL_COMMUNITY): Payer: Medicare Other

## 2019-08-31 ENCOUNTER — Encounter (HOSPITAL_COMMUNITY): Payer: Self-pay | Admitting: Emergency Medicine

## 2019-08-31 ENCOUNTER — Other Ambulatory Visit: Payer: Self-pay

## 2019-08-31 ENCOUNTER — Inpatient Hospital Stay (HOSPITAL_COMMUNITY)
Admission: EM | Admit: 2019-08-31 | Discharge: 2019-09-09 | DRG: 477 | Disposition: A | Discharge status: Skilled Nursing Facility | Payer: Medicare Other | Attending: Internal Medicine | Admitting: Internal Medicine

## 2019-08-31 DIAGNOSIS — Z79899 Other long term (current) drug therapy: Secondary | ICD-10-CM | POA: Diagnosis not present

## 2019-08-31 DIAGNOSIS — M4854XA Collapsed vertebra, not elsewhere classified, thoracic region, initial encounter for fracture: Secondary | ICD-10-CM | POA: Diagnosis not present

## 2019-08-31 DIAGNOSIS — I428 Other cardiomyopathies: Secondary | ICD-10-CM | POA: Diagnosis present

## 2019-08-31 DIAGNOSIS — J449 Chronic obstructive pulmonary disease, unspecified: Secondary | ICD-10-CM | POA: Diagnosis not present

## 2019-08-31 DIAGNOSIS — M549 Dorsalgia, unspecified: Secondary | ICD-10-CM

## 2019-08-31 DIAGNOSIS — I132 Hypertensive heart and chronic kidney disease with heart failure and with stage 5 chronic kidney disease, or end stage renal disease: Secondary | ICD-10-CM | POA: Diagnosis not present

## 2019-08-31 DIAGNOSIS — Z20822 Contact with and (suspected) exposure to covid-19: Secondary | ICD-10-CM | POA: Diagnosis present

## 2019-08-31 DIAGNOSIS — Z03818 Encounter for observation for suspected exposure to other biological agents ruled out: Secondary | ICD-10-CM | POA: Diagnosis not present

## 2019-08-31 DIAGNOSIS — Z515 Encounter for palliative care: Secondary | ICD-10-CM | POA: Diagnosis not present

## 2019-08-31 DIAGNOSIS — Z992 Dependence on renal dialysis: Secondary | ICD-10-CM | POA: Diagnosis not present

## 2019-08-31 DIAGNOSIS — R109 Unspecified abdominal pain: Secondary | ICD-10-CM | POA: Diagnosis not present

## 2019-08-31 DIAGNOSIS — I10 Essential (primary) hypertension: Secondary | ICD-10-CM | POA: Diagnosis not present

## 2019-08-31 DIAGNOSIS — Z950 Presence of cardiac pacemaker: Secondary | ICD-10-CM | POA: Diagnosis not present

## 2019-08-31 DIAGNOSIS — K5792 Diverticulitis of intestine, part unspecified, without perforation or abscess without bleeding: Secondary | ICD-10-CM | POA: Diagnosis not present

## 2019-08-31 DIAGNOSIS — Z9981 Dependence on supplemental oxygen: Secondary | ICD-10-CM

## 2019-08-31 DIAGNOSIS — I480 Paroxysmal atrial fibrillation: Secondary | ICD-10-CM | POA: Diagnosis present

## 2019-08-31 DIAGNOSIS — J9611 Chronic respiratory failure with hypoxia: Secondary | ICD-10-CM | POA: Diagnosis not present

## 2019-08-31 DIAGNOSIS — Z7189 Other specified counseling: Secondary | ICD-10-CM | POA: Diagnosis not present

## 2019-08-31 DIAGNOSIS — S22089A Unspecified fracture of T11-T12 vertebra, initial encounter for closed fracture: Secondary | ICD-10-CM | POA: Diagnosis not present

## 2019-08-31 DIAGNOSIS — R5381 Other malaise: Secondary | ICD-10-CM | POA: Diagnosis present

## 2019-08-31 DIAGNOSIS — N2581 Secondary hyperparathyroidism of renal origin: Secondary | ICD-10-CM | POA: Diagnosis present

## 2019-08-31 DIAGNOSIS — I5042 Chronic combined systolic (congestive) and diastolic (congestive) heart failure: Secondary | ICD-10-CM | POA: Diagnosis not present

## 2019-08-31 DIAGNOSIS — Z8673 Personal history of transient ischemic attack (TIA), and cerebral infarction without residual deficits: Secondary | ICD-10-CM | POA: Diagnosis not present

## 2019-08-31 DIAGNOSIS — L89151 Pressure ulcer of sacral region, stage 1: Secondary | ICD-10-CM | POA: Diagnosis present

## 2019-08-31 DIAGNOSIS — K5700 Diverticulitis of small intestine with perforation and abscess without bleeding: Secondary | ICD-10-CM | POA: Diagnosis not present

## 2019-08-31 DIAGNOSIS — Z87891 Personal history of nicotine dependence: Secondary | ICD-10-CM | POA: Diagnosis not present

## 2019-08-31 DIAGNOSIS — E8889 Other specified metabolic disorders: Secondary | ICD-10-CM | POA: Diagnosis present

## 2019-08-31 DIAGNOSIS — Z66 Do not resuscitate: Secondary | ICD-10-CM | POA: Diagnosis not present

## 2019-08-31 DIAGNOSIS — R52 Pain, unspecified: Secondary | ICD-10-CM

## 2019-08-31 DIAGNOSIS — R296 Repeated falls: Secondary | ICD-10-CM | POA: Diagnosis present

## 2019-08-31 DIAGNOSIS — K5900 Constipation, unspecified: Secondary | ICD-10-CM | POA: Diagnosis present

## 2019-08-31 DIAGNOSIS — I120 Hypertensive chronic kidney disease with stage 5 chronic kidney disease or end stage renal disease: Secondary | ICD-10-CM | POA: Diagnosis not present

## 2019-08-31 DIAGNOSIS — R0602 Shortness of breath: Secondary | ICD-10-CM | POA: Diagnosis not present

## 2019-08-31 DIAGNOSIS — K5732 Diverticulitis of large intestine without perforation or abscess without bleeding: Secondary | ICD-10-CM | POA: Diagnosis present

## 2019-08-31 DIAGNOSIS — S22080A Wedge compression fracture of T11-T12 vertebra, initial encounter for closed fracture: Secondary | ICD-10-CM | POA: Diagnosis present

## 2019-08-31 DIAGNOSIS — D638 Anemia in other chronic diseases classified elsewhere: Secondary | ICD-10-CM | POA: Diagnosis present

## 2019-08-31 DIAGNOSIS — K6389 Other specified diseases of intestine: Secondary | ICD-10-CM | POA: Diagnosis not present

## 2019-08-31 DIAGNOSIS — L299 Pruritus, unspecified: Secondary | ICD-10-CM | POA: Diagnosis present

## 2019-08-31 DIAGNOSIS — G4700 Insomnia, unspecified: Secondary | ICD-10-CM | POA: Diagnosis present

## 2019-08-31 DIAGNOSIS — Z79891 Long term (current) use of opiate analgesic: Secondary | ICD-10-CM

## 2019-08-31 DIAGNOSIS — N186 End stage renal disease: Secondary | ICD-10-CM | POA: Diagnosis present

## 2019-08-31 DIAGNOSIS — E785 Hyperlipidemia, unspecified: Secondary | ICD-10-CM | POA: Diagnosis present

## 2019-08-31 DIAGNOSIS — R531 Weakness: Secondary | ICD-10-CM | POA: Diagnosis not present

## 2019-08-31 DIAGNOSIS — Z9049 Acquired absence of other specified parts of digestive tract: Secondary | ICD-10-CM

## 2019-08-31 DIAGNOSIS — Z7982 Long term (current) use of aspirin: Secondary | ICD-10-CM | POA: Diagnosis not present

## 2019-08-31 DIAGNOSIS — G8929 Other chronic pain: Secondary | ICD-10-CM | POA: Diagnosis not present

## 2019-08-31 DIAGNOSIS — E876 Hypokalemia: Secondary | ICD-10-CM | POA: Diagnosis present

## 2019-08-31 LAB — COMPREHENSIVE METABOLIC PANEL
ALT: 15 U/L (ref 0–44)
AST: 24 U/L (ref 15–41)
Albumin: 3.1 g/dL — ABNORMAL LOW (ref 3.5–5.0)
Alkaline Phosphatase: 102 U/L (ref 38–126)
Anion gap: 17 — ABNORMAL HIGH (ref 5–15)
BUN: 16 mg/dL (ref 8–23)
CO2: 28 mmol/L (ref 22–32)
Calcium: 8.9 mg/dL (ref 8.9–10.3)
Chloride: 92 mmol/L — ABNORMAL LOW (ref 98–111)
Creatinine, Ser: 4.23 mg/dL — ABNORMAL HIGH (ref 0.44–1.00)
GFR calc Af Amer: 10 mL/min — ABNORMAL LOW (ref >60)
GFR calc non Af Amer: 9 mL/min — ABNORMAL LOW (ref >60)
Glucose, Bld: 122 mg/dL — ABNORMAL HIGH (ref 70–99)
Potassium: 3.8 mmol/L (ref 3.5–5.1)
Sodium: 137 mmol/L (ref 135–145)
Total Bilirubin: 0.7 mg/dL (ref 0.3–1.2)
Total Protein: 6.8 g/dL (ref 6.5–8.1)

## 2019-08-31 LAB — CBC
HCT: 42.7 % (ref 36.0–46.0)
Hemoglobin: 13.1 g/dL (ref 12.0–15.0)
MCH: 30.7 pg (ref 26.0–34.0)
MCHC: 30.7 g/dL (ref 30.0–36.0)
MCV: 100 fL (ref 80.0–100.0)
Platelets: 189 10*3/uL (ref 150–400)
RBC: 4.27 MIL/uL (ref 3.87–5.11)
RDW: 15.9 % — ABNORMAL HIGH (ref 11.5–15.5)
WBC: 18 10*3/uL — ABNORMAL HIGH (ref 4.0–10.5)
nRBC: 0 % (ref 0.0–0.2)

## 2019-08-31 LAB — LIPASE, BLOOD: Lipase: 27 U/L (ref 11–51)

## 2019-08-31 MED ORDER — METRONIDAZOLE IN NACL 5-0.79 MG/ML-% IV SOLN
500.0000 mg | Freq: Once | INTRAVENOUS | Status: AC
  Administered 2019-08-31: 21:00:00 500 mg via INTRAVENOUS
  Filled 2019-08-31: qty 100

## 2019-08-31 MED ORDER — HYDROMORPHONE HCL 1 MG/ML IJ SOLN
0.5000 mg | Freq: Once | INTRAMUSCULAR | Status: DC
  Filled 2019-08-31 (×2): qty 1

## 2019-08-31 MED ORDER — ONDANSETRON HCL 4 MG/2ML IJ SOLN
4.0000 mg | Freq: Once | INTRAMUSCULAR | Status: AC
  Administered 2019-08-31: 16:00:00 4 mg via INTRAVENOUS
  Filled 2019-08-31: qty 2

## 2019-08-31 MED ORDER — SODIUM CHLORIDE 0.9 % IV SOLN
2.0000 g | Freq: Once | INTRAVENOUS | Status: AC
  Administered 2019-08-31: 2 g via INTRAVENOUS
  Filled 2019-08-31: qty 20

## 2019-08-31 MED ORDER — SODIUM CHLORIDE 0.9% FLUSH: 3.0000 mL | Freq: Once | INTRAVENOUS | Status: DC

## 2019-08-31 NOTE — ED Triage Notes (Signed)
Pt arrives to ED from home with complaints of acute on chronic back pain starting a couple days ago that has worsened, emesis x4 starting this morning and SOB this morning as well. Patient was evaluated by dialysis team and sent to ED for evaluation. Patient denies CP  And ABD pain.

## 2019-08-31 NOTE — ED Notes (Signed)
Witnessed Engineer, maintenance 0.5 mg dilaudid, as pt refused pain medicine.

## 2019-08-31 NOTE — ED Notes (Signed)
Patient denies pain and is resting comfortably.  

## 2019-08-31 NOTE — ED Provider Notes (Signed)
[facility] EMERGENCY DEPARTMENT Provider Note   CSN: [id number] Arrival date & time: 08/31/19  1226     History Chief Complaint  Patient presents with  . Back Pain  . Emesis    Valerie Terrell is a 86 y.o. female.  She has a history of end-stage renal disease CHF and chronic back pain.  She was recently admitted for fluid overload and now is getting dialysis 4 times a week.  2 days ago she experienced worsening low back pain that she rates as severe.  No clear trauma.  History of compression fractures.  Has been using her Duragesic patch and oral narcotics probably more frequently than she supposed to.  Today she had 4 episodes of nonbloody vomiting.  On and off shortness of breath.  Could not tolerate going to dialysis and so came here for evaluation.  Swelling in legs had improved but is starting to come back full bit.  No numbness or no weakness in her arms or legs.  Denies any chest pain or abdominal pain.  Does not make any urine.  The history is provided by the patient and a relative.  Back Pain Location:  Lumbar spine Quality:  Aching and stabbing Pain severity:  Severe Pain is:  Same all the time Onset quality:  Sudden Duration:  2 days Timing:  Constant Progression:  Unchanged Chronicity:  New Relieved by:  Nothing Worsened by:  Bending and movement Ineffective treatments:  Narcotics Associated symptoms: no abdominal pain, no bladder incontinence, no bowel incontinence, no chest pain, no dysuria, no fever, no headaches, no leg pain, no numbness, no paresthesias, no tingling and no weakness   Risk factors: hx of osteoporosis   Emesis Severity:  Moderate Timing:  Sporadic Number of daily episodes:  4 Quality:  Stomach contents Progression:  Unchanged Chronicity:  New Recent urination:  Absent Relieved by:  None tried Worsened by:  Nothing Ineffective treatments:  None tried Associated symptoms: no abdominal pain, no fever, no headaches and no sore  throat   Risk factors: no sick contacts        Past Medical History:  Diagnosis Date  . Anemia of chronic disease 2018   + anemia of CRI?  [name] Atrophy of left kidney    with absent blood flow by renal artery dopplers (Dr. Gwenlyn Found)  . 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 Chronic combined systolic and diastolic CHF (congestive heart failure) ([place])    a. 11/2016: echo showing EF of 35-40%, RV strain noted, mild MR and mild TR.   Echo showed much improved EF 12/2017 (55-60%)  . Chronic respiratory failure with hypoxia ([place]) 02/11/2017   Now on chronic O2 (intermittent as of summer 2019).    . Closed fracture of right superior pubic ramus ([place]) 07/18/2018   Non op mgmt (Dr. Victorino December was ortho who saw her in hosp but i don't think pt followed up with him).  . Closed right hip fracture ([place]) 10/04/2018   ORIF  . Debilitated patient    WC dependent as of 2018.  Hosp bed + full assistance with most ADLs s/p hip fx surg 09/2018.  [name] ESRD on hemodialysis ([place]) 01/2017   T/Th/Sat schedule- [place].  Right basilic AV fistula 83/6629.  Graft 2019.  [name] History of adenomatous polyp of colon   . History of kidney stones   . History of subarachnoid hemorrhage 10/2014   after syncope and while on xarelto  . Hyperlipidemia   .  Hypertension    Difficult to control, in the setting of one functioning kidney: pt was referred to nephrology by Dr. Gwenlyn Found 06/2015.  . Lumbar radiculopathy 2012  . Lumbar spinal stenosis 2019   facet injections only very short term relief.  L1 and L2 selective nerve root blocks helpful 04/2018.  . Lumbar spondylosis    MR 07/2016---no sign of spinal nerve compression or cord compression.  Pt set up with outpt ortho while admitted to hosp 07/2016.  . Malnourished ([place])   . Metatarsal fracture 06/10/2016   Nondisplaced, left 5th metatarsal--pt was referred to ortho  . Osteopenia 2014   T-score -2.1  . PAF (paroxysmal atrial fibrillation) (HCC)    Eliquis  started after BRAO and CVA.  She was changed to warfarin 2018. All anticoag stopped due to recurrent falls.  . Presence of permanent cardiac pacemaker   . Right rib fracture 12/2016   s/p fall  . Sick sinus syndrome ([place]) 11/2016   Dual chamber pacer insertion 2018 (Dr. Lovena Le)  . Stroke ([place]) 19/3790   cardioembolic (had CVA while on no anticoag)--"scattered subacute punctate infarcts: 1 in R parietal lobe and 2 in occipital cortex" on MRI br.  CT angio head/neck: aortic arch athero.  R ICA 20% stenosis, L ICA w/out any stenosis.  . Thoracic back pain 01/2017; 01/2018   2018: Facet?  Dr. Ramos->steroid injection.  01/2018-->CT T spine to check for a new comp fx-->none found.  Selective nerve root inj in T spine helpful on R side.   . Thoracic compression fracture ([place]) 07/2016   T3.  T7 and T8-- T8 kyphoplasty during hosp admission 07/2016.  Neuro referred pt to pain mgmt for consideration of injection 09/2016.  I referred her to endo 08/2016 for consideration of calcitonin treatment.    Patient Active Problem List   Diagnosis Date Noted  . Pressure injury of skin 08/17/2019  . Volume overload 08/16/2019  . Disorder of phosphorus metabolism, unspecified 05/15/2019  . Hypoxemia 03/24/2019  . Anaphylactic shock, unspecified, sequela 02/23/2019  . Heterotopic ossification 12/29/2018  . Pain, unspecified 10/15/2018  . Hip fracture ([place]) 10/04/2018  . Fracture of pubic ramus ([place]) 07/19/2018  . Fracture of sacrum ([place]) 07/19/2018  . Perineal hematoma 07/19/2018  . Fever 07/19/2018  . Acute blood loss anemia 07/19/2018  . ESRD (end stage renal disease) on dialysis ([place]) 05/20/2018  . Chronic back pain 05/14/2018  . Chronic pain syndrome 05/14/2018  . Iron deficiency anemia, unspecified 04/25/2018  . Hypoxia 12/14/2017  . Dyspnea 12/14/2017  . Secondary hyperparathyroidism of renal origin ([place]) 08/16/2017  . Degeneration of thoracic intervertebral disc 07/26/2017  . Moderate protein-calorie  malnutrition ([place]) 02/23/2017  . Acute respiratory failure with hypoxia ([place]) 02/13/2017  . Hyperkalemia 02/13/2017  . Chronic respiratory failure with hypoxia ([place]) 02/11/2017  . Debilitated patient 02/07/2017  . Poor tolerance for ambulation 02/07/2017  . Anemia in chronic kidney disease 02/04/2017  . Coagulation defect, unspecified ([place]) 02/04/2017  . Fracture of one rib, right side, initial encounter for closed fracture 01/11/2017  . Closed fracture of one rib of right side   . Chronic combined systolic and diastolic heart failure ([place]) 01/05/2017  . Palpitations 12/15/2016  . Hypokalemia 12/14/2016  . Leukocytosis 12/14/2016  . Long term (current) use of anticoagulants [Z79.01] 12/11/2016  . Non-ischemic cardiomyopathy ([place]) 12/07/2016  . Acute combined systolic and diastolic heart failure ([place])  12/07/2016  . Cardiac pacemaker   . Acute on chronic respiratory failure with hypoxia ([place])   .  Acute pulmonary edema with congestive heart failure ([place]) 11/30/2016  . Elevated troponin level 11/30/2016  . Anemia 11/30/2016  . Chronic midline thoracic back pain 09/15/2016  . History of CVA (cerebrovascular accident) 09/15/2016  . Hyperlipidemia   . Nausea & vomiting 08/05/2016  . Dehydration 08/05/2016  . Osteoporosis 07/28/2016  . Closed compression fracture of thoracic vertebra ([place]) 07/23/2016  . Thoracic compression fracture ([place]) 07/22/2016  . Flank pain 07/11/2016  . Hypoalbuminemia 07/11/2016  . Renal artery stenosis ([place]) 06/24/2016  . Nondisplaced fracture of fifth metatarsal bone, left foot, initial encounter for closed fracture 06/10/2016  . Rib contusion, left, initial encounter 06/10/2016  . Single kidney 03/13/2016  . Chest pain   . Dyslipidemia   . PAF (paroxysmal atrial fibrillation) ([place]) 01/28/2016  . Essential hypertension 01/28/2016  . History of cardioembolic stroke 16/02/9603  . Personal history of subarachnoid hemorrhage 01/28/2016  . CKD (chronic kidney  disease), stage IV ([place]) 01/28/2016  . Gait disturbance 01/27/2016  . Mitral valve disease 01/08/2014  . Colon polyps 07/10/2013  . Lumbar radiculopathy 02/04/2011    Past Surgical History:  Procedure Laterality Date  . APPENDECTOMY  child  . AV FISTULA PLACEMENT Right 03/31/2017   Procedure: Right ARM Basilic vein transposition;  Surgeon: Conrad Portal, MD;  Location: [facility];  Service: Vascular;  Laterality: Right;  . AV FISTULA PLACEMENT Right 12/06/2017   Procedure: INSERTION OF ARTERIOVENOUS (AV) GORE-TEX GRAFT ARM RIGHT UPPER EXTREMITY;  Surgeon: [name], MD;  Location: [facility];  Service: Vascular;  Laterality: Right;  . CARDIOVASCULAR STRESS TEST  01/31/2016   Stress myoview: NORMAL/Low risk.  EF 56%.  . [place]  . COLONOSCOPY  2015   + hx of adenomatous polyps.  Need digest health spec in [facility] records to see when pt due for next colonoscopy  . DECLOT CKV Right 03/21/2019  . EYE SURGERY Bilateral    Catarct  . INSERTION OF DIALYSIS CATHETER Right 01/29/2017   Procedure: INSERTION OF DIALYSIS CATHETER- RIGHT INTERNAL JUGULAR;  Surgeon: [name], MD;  Location: [facility];  Service: Vascular;  Laterality: Right;  . INTRAMEDULLARY (IM) NAIL INTERTROCHANTERIC Right 10/04/2018   Procedure: INTRAMEDULLARY (IM) NAIL INTERTROCHANTRIC;  Surgeon: Nicholes Stairs, MD;  Location: [facility];  Service: Orthopedics;  Laterality: Right;  . IR GENERIC HISTORICAL  07/24/2016   IR KYPHO THORACIC WITH BONE BIOPSY 07/24/2016 Luanne Bras, MD [facility]INTER[facility]  . KYPHOPLASTY  07/27/2016   T8  . PACEMAKER IMPLANT N/A 12/03/2016   Procedure: Pacemaker Implant;  Surgeon: Evans Lance, MD;  Location: [facility];  Service: Cardiovascular;  Laterality: N/A;  . Renal artery dopplers  02/26/2016   Her right renal dimension was 11 cm pole to pole with mild to moderate right renal artery stenosis. A right renal aortic ratio was 3.22 suggesting less than a 50% stenosis.  . Right  upper arm AV Gore-Tex graft  12/06/2017   Dr. Donnetta Hutching  . TONSILLECTOMY    . TRANSTHORACIC ECHOCARDIOGRAM  01/29/2016; 11/2016; 12/2017   2017: EF 55-60%, normal LV wall motion, grade I DD.  No cardiac source of emboli was seen.  2018: EF 35-40%, could not assess DD due to a-fib, pulm HTN noted. 12/2017 EF 55-60%, nl wall motion, grd II DD, mod MR and pulm regurg, increased pulm pressure (peak 52).     OB History   No obstetric history on file.     Family History  Problem Relation Age of Onset  .  Cancer Mother   . Heart disease Father   . Early death Father   . Sudden Cardiac Death Neg Hx     Social History   Tobacco Use  . Smoking status: Former Smoker    Years: 22.00    Quit date: 03/17/1972    Years since quitting: 47.4  . Smokeless tobacco: Never Used  Substance Use Topics  . Alcohol use: Yes    Alcohol/week: 1.0 standard drinks    Types: 1 Glasses of wine per week    Comment: wine  . Drug use: No    Home Medications Prior to Admission medications   Medication Sig Start Date End Date Taking? Authorizing Provider  acetaminophen (TYLENOL) 500 MG tablet Take 500-1,000 mg 3 (three) times daily as needed by mouth for moderate pain.     [provider]  amiodarone (PACERONE) 200 MG tablet TAKE 1 TABLET BY MOUTH  DAILY Patient taking differently: Take 200 mg by mouth daily.  03/27/19   Lorretta Harp, MD  aspirin EC 325 MG tablet Take 325 mg by mouth every evening.    [provider]  b complex-vitamin c-folic acid (NEPHRO-VITE) 0.8 MG TABS tablet Take 1 tablet by mouth See admin instructions. Take one tablet by mouth on Tuesday, Thursday, Saturday - after dialysis 09/07/18   [provider]  calcitonin, salmon, (MIACALCIN/FORTICAL) 200 UNIT/ACT nasal spray Place 1 spray into alternate nostrils daily.  07/06/19   [provider]  D-5000 125 MCG (5000 UT) TABS Take 1 tablet by mouth daily. 08/18/19   [provider]  DENTA 5000 PLUS 1.1 %  CREA dental cream Place 1 application onto teeth at bedtime.  04/06/19   [provider]  docusate sodium (COLACE) 100 MG capsule Take 100 mg by mouth daily as needed for mild constipation.     [provider]  doxercalciferol (HECTOROL) 2.5 MCG capsule Take 2.5 mcg by mouth 3 (three) times a week.  02/28/19 02/27/20  [provider]  fentaNYL (DURAGESIC) 50 MCG/HR Place 1 patch onto the skin every 3 (three) days. 08/21/19   McGowen, Adrian Blackwater, MD  heparin 1000 unit/mL SOLN injection 1,000 Units by Dialysis route one time in dialysis.  02/07/19 02/06/20  [provider]  isosorbide mononitrate (IMDUR) 60 MG 24 hr tablet TAKE 1 TABLET BY MOUTH  DAILY ; TAKE 1 TABLET ON  SUN, MON, WED, FRI MORNINGS AND 1 TABLET ON TUES, THU,  SAT AFTER DIALYSIS Patient taking differently: Take 60 mg by mouth daily.  07/21/19   McGowen, Adrian Blackwater, MD  Lidocaine 4 % PTCH Apply 1 patch topically daily as needed (Mild pain).    [provider]  lidocaine-prilocaine (EMLA) cream Apply 1 application topically See admin instructions. Apply small amount to access site (AVF) one hour before dialysis, cover with occlusive dressing (saran wrap) 11/11/18   [provider]  Multiple Vitamins-Minerals (MULTIVITAMIN WITH MINERALS) tablet Take 1 tablet by mouth at bedtime.     [provider]  naloxone [facility]) 0.4 MG/ML injection Inject IM as needed for reversal of opioid pain medication Patient taking differently: Inject 0.4 mg into the muscle See admin instructions. Inject IM as needed for reversal of opioid pain medication 07/20/19   McGowen, Adrian Blackwater, MD  ondansetron (ZOFRAN) 4 MG tablet Take 1 tablet (4 mg total) by mouth every 8 (eight) hours as needed for nausea or vomiting. 07/12/19   McGowen, Adrian Blackwater, MD  Oxycodone HCl 10 MG TABS 1-2 tabs  po q6h prn severe pain 08/21/19   McGowen, Adrian Blackwater, MD  polyvinyl alcohol (LIQUIFILM TEARS) 1.4 % ophthalmic solution Place 1-2 drops into  both eyes daily as needed for dry eyes.     [provider]  pravastatin (PRAVACHOL) 40 MG tablet TAKE 1 TABLET BY MOUTH  EVERY EVENING Patient taking differently: Take 40 mg by mouth at bedtime.  03/09/18   McGowen, Adrian Blackwater, MD  sevelamer carbonate (RENVELA) 800 MG tablet Take 800-2,400 mg by mouth See admin instructions. Take two - three tablets (1600 mg - 2400 mg) by mouth up to three times daily with meals, take one tablet (800 mg) with snacks    [provider]  vitamin C (ASCORBIC ACID) 500 MG tablet Take 500 mg by mouth daily.    [provider]  Vitamin D, Ergocalciferol, (DRISDOL) 1.25 MG (50000 UT) CAPS capsule Take 1 capsule (50,000 Units total) by mouth every Thursday. 04/28/18   McGowen, Adrian Blackwater, MD    Allergies    Clonidine derivatives, Adhesive [tape], Codeine, Nickel, and Sulfa antibiotics  Review of Systems   Review of Systems  Constitutional: Negative for fever.  HENT: Negative for sore throat.   Eyes: Negative for visual disturbance.  Respiratory: Negative for shortness of breath.   Cardiovascular: Negative for chest pain.  Gastrointestinal: Positive for vomiting. Negative for abdominal pain and bowel incontinence.  Genitourinary: Negative for bladder incontinence and dysuria.  Musculoskeletal: Positive for back pain.  Skin: Negative for rash.  Neurological: Negative for tingling, weakness, numbness, headaches and paresthesias.    Physical Exam Updated Vital Signs BP (!) 141/58 (BP Location: Left Arm)   Pulse 87   Temp (!) 97.4 F (36.3 C) (Oral)   Resp (!) 22   LMP  (LMP Unknown)   SpO2 (!) 89%   Physical Exam Vitals and nursing note reviewed.  Constitutional:      General: She is not in acute distress.    Appearance: She is well-developed and underweight.  HENT:     Head: Normocephalic and atraumatic.  Eyes:     Conjunctiva/sclera: Conjunctivae normal.  Cardiovascular:     Rate and Rhythm: Normal rate and regular rhythm.       Heart sounds: No murmur.  Pulmonary:     Effort: Pulmonary effort is normal. No respiratory distress.     Breath sounds: Normal breath sounds.  Abdominal:     Palpations: Abdomen is soft.     Tenderness: There is no abdominal tenderness.  Musculoskeletal:     Cervical back: Neck supple.     Right lower leg: Edema present.     Left lower leg: Edema present.     Comments: Fistula right upper arm.  Diffuse tenderness through lumbar spine  Skin:    General: Skin is warm and dry.     Capillary Refill: Capillary refill takes less than 2 seconds.  Neurological:     General: No focal deficit present.     Mental Status: She is alert and oriented to person, place, and time.     Sensory: No sensory deficit.     Motor: No weakness.     ED Results / Procedures / Treatments   Labs (all labs ordered are listed, but only abnormal results are displayed) Labs Reviewed  COMPREHENSIVE METABOLIC PANEL - Abnormal; Notable for the following components:      Result Value   Chloride 92 (*)    Glucose, Bld 122 (*)    Creatinine, Ser 4.23 (*)  Albumin 3.1 (*)    GFR calc non Af Amer 9 (*)    GFR calc Af Amer 10 (*)    Anion gap 17 (*)    All other components within normal limits  CBC - Abnormal; Notable for the following components:   WBC 18.0 (*)    RDW 15.9 (*)    All other components within normal limits  BASIC METABOLIC PANEL - Abnormal; Notable for the following components:   Sodium 133 (*)    Chloride 91 (*)    BUN 25 (*)    Creatinine, Ser 4.98 (*)    Calcium 8.4 (*)    GFR calc non Af Amer 7 (*)    GFR calc Af Amer 9 (*)    All other components within normal limits  CBC - Abnormal; Notable for the following components:   WBC 12.9 (*)    RBC 3.57 (*)    Hemoglobin 11.0 (*)    HCT 34.9 (*)    RDW 15.8 (*)    All other components within normal limits  SARS CORONAVIRUS 2 (TAT 6-24 HRS)  LIPASE, BLOOD  URINALYSIS, ROUTINE W REFLEX MICROSCOPIC    EKG EKG  Interpretation  Date/Time:  Thursday August 31 2019 12:47:47 EDT Ventricular Rate:  89 PR Interval:  158 QRS Duration: 102 QT Interval:  402 QTC Calculation: 489 R Axis:   43 Text Interpretation: Normal sinus rhythm Nonspecific ST abnormality Abnormal ECG No significant change since prior 3/21 Confirmed by Aletta Edouard [phone]) on 08/31/2019 3:17:53 PM   Radiology CT ABDOMEN PELVIS WO CONTRAST  Result Date: 08/31/2019 CLINICAL DATA:  Acute on chronic back pain for several days, emesis, short of breath EXAM: CT ABDOMEN AND PELVIS WITHOUT CONTRAST TECHNIQUE: Multidetector CT imaging of the abdomen and pelvis was performed following the standard protocol without IV contrast. COMPARISON:  12/13/2018 FINDINGS: Lower chest: Hypoventilatory changes are seen at the lung bases. There is chronic scarring at the left lung base. Diffuse atherosclerosis of the aorta and coronary vessels again noted. Extensive calcification of the aortic and mitral valves. Pacemaker unchanged. Hepatobiliary: Gallbladder sludge is identified without cholecystitis. The liver is unremarkable. Pancreas: Unremarkable. No pancreatic ductal dilatation or surrounding inflammatory changes. Spleen: Normal in size without focal abnormality. Adrenals/Urinary Tract: Progressive bilateral renal cortical atrophy consistent with end-stage renal disease. Stable cysts right kidney. No urinary tract calculi or obstruction. Bladder is unremarkable. The adrenals are unremarkable. Stomach/Bowel: There is circumferential wall thickening of the mid sigmoid colon, reference image 64. Mild pericolonic fat stranding consistent with acute colitis or diverticulitis. No bowel obstruction or ileus. The appendix is surgically absent. Vascular/Lymphatic: There is severe atherosclerosis throughout the aorta and its branches. No pathologic adenopathy. Reproductive: Uterus is markedly atrophic.  No adnexal masses. Other: No free fluid or free gas. Diffuse atrophy of  the abdominal wall musculature unchanged. Musculoskeletal: Chronic T10 compression deformity with previous vertebral augmentation. There is an age-indeterminate T12 compression fracture, new since prior CT. Less than 25% loss of height. No significant paraspinal hematoma. No additional fractures. Postsurgical changes right hip. Reconstructed images demonstrate no additional findings. IMPRESSION: 1. Circumferential wall thickening of the mid sigmoid colon with mild pericolonic fat stranding, consistent with acute uncomplicated diverticulitis or colitis. 2. New T12 compression deformity, with less than 25% loss of height. Based on appearance, this could reflect an acute or subacute fracture. 3. Sequela of end-stage renal disease. 4. Aortic Atherosclerosis (ICD10-I70.0). Severe coronary artery atherosclerosis. Electronically Signed   By: Diana Eves.D.  On: 08/31/2019 19:37   DG Chest Port 1 View  Result Date: 08/31/2019 CLINICAL DATA:  Shortness of breath EXAM: PORTABLE CHEST 1 VIEW COMPARISON:  August 16, 2019 and April 13, 2019 FINDINGS: There is suspected scarring in the left base. There may also be epicardial fat prominence in this area. The appearance is stable. There is no apparent edema or airspace opacity. Heart is upper normal in size with pulmonary vascularity normal. Pacemaker lead tips are attached to the right atrium and right ventricle. No adenopathy. There is aortic atherosclerosis. Bones are osteoporotic. Patient is undergone prior kyphoplasty procedure in the lower thoracic region IMPRESSION: Suspect a degree of scarring in the lateral left base. No edema or airspace opacity. Stable cardiac silhouette. Pacemaker leads attached to right atrium and right ventricle. Bones osteoporotic. Aortic Atherosclerosis (ICD10-I70.0). Electronically Signed   By: Lowella Grip III M.D.   On: 08/31/2019 15:29    Procedures Procedures (including critical care time)  Medications Ordered in  ED Medications  sodium chloride flush (NS) 0.9 % injection 3 mL (has no administration in time range)  aspirin EC tablet 325 mg (has no administration in time range)  fentaNYL (DURAGESIC) 50 MCG/HR 1 patch (has no administration in time range)  oxyCODONE (Oxy IR/ROXICODONE) immediate release tablet 10-20 mg (10 mg Oral Given 09/01/19 0828)  amiodarone (PACERONE) tablet 200 mg (200 mg Oral Given 09/01/19 0905)  isosorbide mononitrate (IMDUR) 24 hr tablet 60 mg (60 mg Oral Not Given 09/01/19 0906)  pravastatin (PRAVACHOL) tablet 40 mg (40 mg Oral Not Given 09/01/19 0139)  calcitonin (salmon) (MIACALCIN/FORTICAL) nasal spray 1 spray (1 spray Alternating Nares Given 09/01/19 0906)  docusate sodium (COLACE) capsule 100 mg (has no administration in time range)  sevelamer carbonate (RENVELA) tablet 1,600 mg (1,600 mg Oral Given 09/01/19 0905)  ondansetron (ZOFRAN) tablet 4 mg ( Oral See Alternative 09/01/19 0821)    Or  ondansetron (ZOFRAN) injection 4 mg (4 mg Intravenous Given 09/01/19 0821)  heparin injection 5,000 Units (5,000 Units Subcutaneous Given 09/01/19 0504)  cefTRIAXone (ROCEPHIN) 2 g in sodium chloride 0.9 % 100 mL IVPB (has no administration in time range)  metroNIDAZOLE (FLAGYL) IVPB 500 mg (500 mg Intravenous New Bag/Given 09/01/19 0505)  Chlorhexidine Gluconate Cloth 2 % PADS 6 each (6 each Topical Given 09/01/19 0905)  HYDROmorphone (DILAUDID) injection 0.5 mg (has no administration in time range)  ondansetron (ZOFRAN) injection 4 mg (4 mg Intravenous Given 08/31/19 1606)  cefTRIAXone (ROCEPHIN) 2 g in sodium chloride 0.9 % 100 mL IVPB (0 g Intravenous Stopped 08/31/19 2113)    And  metroNIDAZOLE (FLAGYL) IVPB 500 mg (0 mg Intravenous Stopped 08/31/19 2240)    ED Course  I have reviewed the triage vital signs and the nursing notes.  Pertinent labs & imaging results that were available during my care of the patient were reviewed by me and considered in my medical decision making (see chart  for details).  Clinical Course as of Aug 31 937  Thu Aug 31, 2019  1601 Discussed with Dr. Justin Mend from nephrology.  He is in agreement to go ahead and do CT abdomen and pelvis with IV contrast.  Expectation is she may need to be admitted for pain control.   [MB]  Fri Sep 01, 2019  0937 CO2: 28 [MB]    Clinical Course User Index [MB] Hayden Rasmussen, MD   MDM Rules/Calculators/A&P  This patient complains of acute on chronic low back pain and nausea vomiting; this involves an extensive number of treatment Options and is a complaint that carries with it a high risk of complications and Morbidity. The differential includes musculoskeletal strain, compression fractures, vascular catastrophe, obstruction diverticulitis, kidney stone, metabolic derangement  I ordered, reviewed and interpreted labs, which included elevated white count of 18.  Chemistries fairly normal other than creatinine elevated at 4.2.  History of renal failure.  Covid testing negative. I ordered medication Dilaudid for pain and ceftriaxone and Flagyl for diverticulitis I ordered imaging studies which included chest x-ray and CAT scan of abdomen and pelvis and I independently    visualized and interpreted imaging which showed chest x-ray did not show any obvious infiltrates, CAT scan showing compression fracture T12 along with inflammatory changes around sigmoid Additional history obtained from sister Previous records obtained and reviewed in epic I consulted Dr. Justin Mend from nephrology and Dr. Hal Hope [facility] hospitalist and discussed lab and imaging findings  After the interventions stated above, I reevaluated the patient and found pain was somewhat improved.  We discussed the need for admission to treat her with antibiotics and to further work on her pain management.  Patient agreeable to plan.   Final Clinical Impression(s) / ED Diagnoses Final diagnoses:  Compression fracture of T12 vertebra, initial  encounter [facility])  Diverticulitis    Rx / DC Orders ED Discharge Orders    None       Hayden Rasmussen, MD 09/01/19 [phone]

## 2019-09-01 ENCOUNTER — Encounter (HOSPITAL_COMMUNITY): Payer: Self-pay | Admitting: Internal Medicine

## 2019-09-01 ENCOUNTER — Telehealth: Payer: Self-pay | Admitting: Internal Medicine

## 2019-09-01 DIAGNOSIS — Z515 Encounter for palliative care: Secondary | ICD-10-CM

## 2019-09-01 DIAGNOSIS — J449 Chronic obstructive pulmonary disease, unspecified: Secondary | ICD-10-CM | POA: Diagnosis present

## 2019-09-01 DIAGNOSIS — Z79891 Long term (current) use of opiate analgesic: Secondary | ICD-10-CM | POA: Diagnosis not present

## 2019-09-01 DIAGNOSIS — Z9981 Dependence on supplemental oxygen: Secondary | ICD-10-CM | POA: Diagnosis not present

## 2019-09-01 DIAGNOSIS — K5732 Diverticulitis of large intestine without perforation or abscess without bleeding: Secondary | ICD-10-CM | POA: Diagnosis present

## 2019-09-01 DIAGNOSIS — Z950 Presence of cardiac pacemaker: Secondary | ICD-10-CM | POA: Diagnosis not present

## 2019-09-01 DIAGNOSIS — Z7982 Long term (current) use of aspirin: Secondary | ICD-10-CM | POA: Diagnosis not present

## 2019-09-01 DIAGNOSIS — Z992 Dependence on renal dialysis: Secondary | ICD-10-CM | POA: Diagnosis not present

## 2019-09-01 DIAGNOSIS — S22080A Wedge compression fracture of T11-T12 vertebra, initial encounter for closed fracture: Secondary | ICD-10-CM | POA: Diagnosis present

## 2019-09-01 DIAGNOSIS — K5900 Constipation, unspecified: Secondary | ICD-10-CM | POA: Diagnosis present

## 2019-09-01 DIAGNOSIS — I5042 Chronic combined systolic (congestive) and diastolic (congestive) heart failure: Secondary | ICD-10-CM | POA: Diagnosis not present

## 2019-09-01 DIAGNOSIS — Z20822 Contact with and (suspected) exposure to covid-19: Secondary | ICD-10-CM | POA: Diagnosis present

## 2019-09-01 DIAGNOSIS — Z87891 Personal history of nicotine dependence: Secondary | ICD-10-CM | POA: Diagnosis not present

## 2019-09-01 DIAGNOSIS — R0902 Hypoxemia: Secondary | ICD-10-CM | POA: Diagnosis not present

## 2019-09-01 DIAGNOSIS — M549 Dorsalgia, unspecified: Secondary | ICD-10-CM | POA: Diagnosis present

## 2019-09-01 DIAGNOSIS — I498 Other specified cardiac arrhythmias: Secondary | ICD-10-CM | POA: Diagnosis not present

## 2019-09-01 DIAGNOSIS — S22020A Wedge compression fracture of second thoracic vertebra, initial encounter for closed fracture: Secondary | ICD-10-CM | POA: Diagnosis not present

## 2019-09-01 DIAGNOSIS — S0993XA Unspecified injury of face, initial encounter: Secondary | ICD-10-CM | POA: Diagnosis not present

## 2019-09-01 DIAGNOSIS — I132 Hypertensive heart and chronic kidney disease with heart failure and with stage 5 chronic kidney disease, or end stage renal disease: Secondary | ICD-10-CM | POA: Diagnosis not present

## 2019-09-01 DIAGNOSIS — Z79899 Other long term (current) drug therapy: Secondary | ICD-10-CM | POA: Diagnosis not present

## 2019-09-01 DIAGNOSIS — M899 Disorder of bone, unspecified: Secondary | ICD-10-CM | POA: Diagnosis not present

## 2019-09-01 DIAGNOSIS — G8929 Other chronic pain: Secondary | ICD-10-CM | POA: Diagnosis present

## 2019-09-01 DIAGNOSIS — K5792 Diverticulitis of intestine, part unspecified, without perforation or abscess without bleeding: Secondary | ICD-10-CM | POA: Diagnosis not present

## 2019-09-01 DIAGNOSIS — N186 End stage renal disease: Secondary | ICD-10-CM | POA: Diagnosis not present

## 2019-09-01 DIAGNOSIS — W06XXXA Fall from bed, initial encounter: Secondary | ICD-10-CM | POA: Diagnosis not present

## 2019-09-01 DIAGNOSIS — S199XXA Unspecified injury of neck, initial encounter: Secondary | ICD-10-CM | POA: Diagnosis not present

## 2019-09-01 DIAGNOSIS — E8889 Other specified metabolic disorders: Secondary | ICD-10-CM | POA: Diagnosis present

## 2019-09-01 DIAGNOSIS — I480 Paroxysmal atrial fibrillation: Secondary | ICD-10-CM | POA: Diagnosis not present

## 2019-09-01 DIAGNOSIS — E785 Hyperlipidemia, unspecified: Secondary | ICD-10-CM | POA: Diagnosis present

## 2019-09-01 DIAGNOSIS — R040 Epistaxis: Secondary | ICD-10-CM | POA: Diagnosis not present

## 2019-09-01 DIAGNOSIS — I959 Hypotension, unspecified: Secondary | ICD-10-CM | POA: Diagnosis not present

## 2019-09-01 DIAGNOSIS — J9611 Chronic respiratory failure with hypoxia: Secondary | ICD-10-CM | POA: Diagnosis present

## 2019-09-01 DIAGNOSIS — M8008XA Age-related osteoporosis with current pathological fracture, vertebra(e), initial encounter for fracture: Secondary | ICD-10-CM | POA: Diagnosis not present

## 2019-09-01 DIAGNOSIS — Z66 Do not resuscitate: Secondary | ICD-10-CM | POA: Diagnosis not present

## 2019-09-01 DIAGNOSIS — M4854XA Collapsed vertebra, not elsewhere classified, thoracic region, initial encounter for fracture: Secondary | ICD-10-CM | POA: Diagnosis present

## 2019-09-01 DIAGNOSIS — R410 Disorientation, unspecified: Secondary | ICD-10-CM | POA: Diagnosis not present

## 2019-09-01 DIAGNOSIS — N2581 Secondary hyperparathyroidism of renal origin: Secondary | ICD-10-CM | POA: Diagnosis present

## 2019-09-01 DIAGNOSIS — I10 Essential (primary) hypertension: Secondary | ICD-10-CM | POA: Diagnosis not present

## 2019-09-01 DIAGNOSIS — Z8673 Personal history of transient ischemic attack (TIA), and cerebral infarction without residual deficits: Secondary | ICD-10-CM | POA: Diagnosis not present

## 2019-09-01 DIAGNOSIS — E8770 Fluid overload, unspecified: Secondary | ICD-10-CM | POA: Diagnosis not present

## 2019-09-01 DIAGNOSIS — Z743 Need for continuous supervision: Secondary | ICD-10-CM | POA: Diagnosis not present

## 2019-09-01 DIAGNOSIS — Z7189 Other specified counseling: Secondary | ICD-10-CM | POA: Diagnosis not present

## 2019-09-01 DIAGNOSIS — D638 Anemia in other chronic diseases classified elsewhere: Secondary | ICD-10-CM | POA: Diagnosis present

## 2019-09-01 DIAGNOSIS — S0990XA Unspecified injury of head, initial encounter: Secondary | ICD-10-CM | POA: Diagnosis not present

## 2019-09-01 DIAGNOSIS — R109 Unspecified abdominal pain: Secondary | ICD-10-CM | POA: Diagnosis present

## 2019-09-01 DIAGNOSIS — R52 Pain, unspecified: Secondary | ICD-10-CM | POA: Diagnosis not present

## 2019-09-01 DIAGNOSIS — R531 Weakness: Secondary | ICD-10-CM | POA: Diagnosis not present

## 2019-09-01 LAB — CBC
HCT: 34.9 % — ABNORMAL LOW (ref 36.0–46.0)
Hemoglobin: 11 g/dL — ABNORMAL LOW (ref 12.0–15.0)
MCH: 30.8 pg (ref 26.0–34.0)
MCHC: 31.5 g/dL (ref 30.0–36.0)
MCV: 97.8 fL (ref 80.0–100.0)
Platelets: 154 10*3/uL (ref 150–400)
RBC: 3.57 MIL/uL — ABNORMAL LOW (ref 3.87–5.11)
RDW: 15.8 % — ABNORMAL HIGH (ref 11.5–15.5)
WBC: 12.9 10*3/uL — ABNORMAL HIGH (ref 4.0–10.5)
nRBC: 0 % (ref 0.0–0.2)

## 2019-09-01 LAB — BASIC METABOLIC PANEL
Anion gap: 15 (ref 5–15)
BUN: 25 mg/dL — ABNORMAL HIGH (ref 8–23)
CO2: 27 mmol/L (ref 22–32)
Calcium: 8.4 mg/dL — ABNORMAL LOW (ref 8.9–10.3)
Chloride: 91 mmol/L — ABNORMAL LOW (ref 98–111)
Creatinine, Ser: 4.98 mg/dL — ABNORMAL HIGH (ref 0.44–1.00)
GFR calc Af Amer: 9 mL/min — ABNORMAL LOW (ref >60)
GFR calc non Af Amer: 7 mL/min — ABNORMAL LOW (ref >60)
Glucose, Bld: 88 mg/dL (ref 70–99)
Potassium: 4.1 mmol/L (ref 3.5–5.1)
Sodium: 133 mmol/L — ABNORMAL LOW (ref 135–145)

## 2019-09-01 LAB — SARS CORONAVIRUS 2 (TAT 6-24 HRS): SARS Coronavirus 2: NEGATIVE

## 2019-09-01 MED ORDER — HEPARIN SODIUM (PORCINE) 5000 UNIT/ML IJ SOLN
5000.0000 [IU] | Freq: Three times a day (TID) | INTRAMUSCULAR | Status: AC
  Administered 2019-09-01 – 2019-09-05 (×10): 5000 [IU] via SUBCUTANEOUS
  Filled 2019-09-01 (×11): qty 1

## 2019-09-01 MED ORDER — CHLORHEXIDINE GLUCONATE CLOTH 2 % EX PADS
6.0000 | MEDICATED_PAD | Freq: Every day | CUTANEOUS | Status: DC
  Administered 2019-09-01 – 2019-09-08 (×7): 6 via TOPICAL

## 2019-09-01 MED ORDER — DOCUSATE SODIUM 100 MG PO CAPS: 100.0000 mg | ORAL_CAPSULE | Freq: Every day | ORAL | Status: DC | PRN

## 2019-09-01 MED ORDER — HYDROMORPHONE HCL 1 MG/ML IJ SOLN: 0.5000 mg | INTRAMUSCULAR | Status: DC | PRN

## 2019-09-01 MED ORDER — DOXERCALCIFEROL 4 MCG/2ML IV SOLN
3.0000 ug | INTRAVENOUS | Status: DC
  Administered 2019-09-03: 08:00:00 3 ug via INTRAVENOUS
  Filled 2019-09-01 (×2): qty 2

## 2019-09-01 MED ORDER — ISOSORBIDE MONONITRATE ER 60 MG PO TB24
60.0000 mg | ORAL_TABLET | Freq: Every day | ORAL | Status: DC
  Administered 2019-09-03 – 2019-09-08 (×4): 60 mg via ORAL
  Filled 2019-09-01 (×5): qty 1

## 2019-09-01 MED ORDER — SEVELAMER CARBONATE 800 MG PO TABS
1600.0000 mg | ORAL_TABLET | Freq: Three times a day (TID) | ORAL | Status: DC
  Administered 2019-09-03 – 2019-09-08 (×6): 1600 mg via ORAL
  Filled 2019-09-01 (×12): qty 2

## 2019-09-01 MED ORDER — OXYCODONE HCL 5 MG PO TABS
10.0000 mg | ORAL_TABLET | Freq: Four times a day (QID) | ORAL | Status: DC | PRN
  Administered 2019-09-01: 20 mg via ORAL
  Administered 2019-09-01 – 2019-09-09 (×11): 10 mg via ORAL
  Filled 2019-09-01 (×7): qty 2
  Filled 2019-09-01: qty 4
  Filled 2019-09-01 (×4): qty 2
  Filled 2019-09-01: qty 4
  Filled 2019-09-01: qty 2

## 2019-09-01 MED ORDER — SODIUM CHLORIDE 0.9 % IV SOLN
2.0000 g | INTRAVENOUS | Status: DC
  Administered 2019-09-01 – 2019-09-07 (×7): 2 g via INTRAVENOUS
  Filled 2019-09-01 (×7): qty 20

## 2019-09-01 MED ORDER — AMIODARONE HCL 200 MG PO TABS
200.0000 mg | ORAL_TABLET | Freq: Every day | ORAL | Status: DC
  Administered 2019-09-01 – 2019-09-09 (×9): 200 mg via ORAL
  Filled 2019-09-01 (×9): qty 1

## 2019-09-01 MED ORDER — METRONIDAZOLE IN NACL 5-0.79 MG/ML-% IV SOLN
500.0000 mg | Freq: Three times a day (TID) | INTRAVENOUS | Status: DC
  Administered 2019-09-01 – 2019-09-08 (×19): 500 mg via INTRAVENOUS
  Filled 2019-09-01 (×19): qty 100

## 2019-09-01 MED ORDER — ONDANSETRON HCL 4 MG PO TABS
4.0000 mg | ORAL_TABLET | Freq: Four times a day (QID) | ORAL | Status: DC | PRN
  Administered 2019-09-01 – 2019-09-02 (×3): 4 mg via ORAL
  Filled 2019-09-01 (×3): qty 1

## 2019-09-01 MED ORDER — ONDANSETRON HCL 4 MG/2ML IJ SOLN
4.0000 mg | Freq: Four times a day (QID) | INTRAMUSCULAR | Status: DC | PRN
  Administered 2019-09-01 – 2019-09-02 (×2): 4 mg via INTRAVENOUS
  Filled 2019-09-01 (×2): qty 2

## 2019-09-01 MED ORDER — PRAVASTATIN SODIUM 40 MG PO TABS
40.0000 mg | ORAL_TABLET | Freq: Every day | ORAL | Status: DC
  Administered 2019-09-01 – 2019-09-08 (×7): 40 mg via ORAL
  Filled 2019-09-01 (×8): qty 1

## 2019-09-01 MED ORDER — FENTANYL 50 MCG/HR TD PT72
1.0000 | MEDICATED_PATCH | TRANSDERMAL | Status: DC
  Administered 2019-09-02 – 2019-09-06 (×2): 1 via TRANSDERMAL
  Filled 2019-09-01 (×3): qty 1

## 2019-09-01 MED ORDER — ASPIRIN EC 325 MG PO TBEC
325.0000 mg | DELAYED_RELEASE_TABLET | Freq: Every evening | ORAL | Status: DC
  Administered 2019-09-01 – 2019-09-08 (×6): 325 mg via ORAL
  Filled 2019-09-01 (×8): qty 1

## 2019-09-01 MED ORDER — CALCITONIN (SALMON) 200 UNIT/ACT NA SOLN
1.0000 | Freq: Every day | NASAL | Status: DC
  Administered 2019-09-01 – 2019-09-09 (×8): 1 via NASAL
  Filled 2019-09-01: qty 3.7

## 2019-09-01 NOTE — Progress Notes (Signed)
Patient arrived to unit the unit A&Ox4, but appeared to have some confusion at times. She would say something and when you ask did she say something she would deny it. Throughout night she would say she wasn't getting any help and also stated that the RN was trying to kill her. Patient asked to speak to the charge nurse and when she came in she stated that she wasn't getting any help and that she wanted to call her daughter.    Asked several times when the MD would be in. Patient has call bell, cell phone,and  hospital phone within reach.

## 2019-09-01 NOTE — TOC Initial Note (Signed)
Transition of Care [facility]) - Initial/Assessment Note    Patient Details  Name: Valerie Terrell MRN: [id number] Date of Birth: 04/26/1933  Transition of Care [facility]) CM/SW Contact:    Sharin Mons, RN Phone Number: 09/01/2019, 10:06 AM  Clinical Narrative:    Admitted with abdominal pain, diverticulitis. Hx of ESRD on hemodialysis on Tuesday Thursday Saturdays. Pt from home with daughter . RNCM received consult for possible SNF placement at time of discharge. RNCM spoke with patient and pt's daughter regarding PT recommendation of SNF placement at time of discharge. Patient's daughter reported that she  is currently unable to care for patient at their home given patient's current physical needs and fall risk. Patient expressed understanding of PT recommendation. States she really would like to go home   however is agreeable to SNF placement at time of discharge. Patient without  preference. RNCM discussed insurance authorization process and provided Medicare SNF ratings list. Patient expressed being hopeful for rehab and to feel better soon. No further questions reported at this time. RNCM to continue to follow and assist with discharge planning needs.              Expected Discharge Plan: Skilled Nursing Facility(vs home with home health) Barriers to Discharge: Continued Medical Work up   Patient Goals and CMS Choice Patient states their goals for this hospitalization and ongoing recovery are:: To get better CMS Medicare.gov Compare Post Acute Care list provided to:: Patient    Expected Discharge Plan and Services Expected Discharge Plan: Skilled Nursing Facility(vs home with home health)       Prior Living Arrangements/Services   Activities of Daily Living Home Assistive Devices/Equipment: Hearing aid, Wheelchair ADL Screening (condition at time of admission) Patient's cognitive ability adequate to safely complete daily activities?: Yes Is the patient deaf or have difficulty  hearing?: Yes Does the patient have difficulty seeing, even when wearing glasses/contacts?: No Does the patient have difficulty concentrating, remembering, or making decisions?: No Patient able to express need for assistance with ADLs?: Yes Does the patient have difficulty dressing or bathing?: No Independently performs ADLs?: Yes (appropriate for developmental age) Does the patient have difficulty walking or climbing stairs?: Yes Weakness of Legs: Both Weakness of Arms/Hands: None  Permission Sought/Granted                  Emotional Assessment              Admission diagnosis:  Diverticulitis [K57.92] Patient Active Problem List   Diagnosis Date Noted  . T12 compression fracture ([place]) 09/01/2019  . Diverticulitis 08/31/2019  . Pressure injury of skin 08/17/2019  . Volume overload 08/16/2019  . Disorder of phosphorus metabolism, unspecified 05/15/2019  . Hypoxemia 03/24/2019  . Anaphylactic shock, unspecified, sequela 02/23/2019  . Heterotopic ossification 12/29/2018  . Pain, unspecified 10/15/2018  . Hip fracture ([place]) 10/04/2018  . Fracture of pubic ramus ([place]) 07/19/2018  . Fracture of sacrum ([place]) 07/19/2018  . Perineal hematoma 07/19/2018  . Fever 07/19/2018  . Acute blood loss anemia 07/19/2018  . ESRD (end stage renal disease) ([place]) 05/20/2018  . Chronic back pain 05/14/2018  . Chronic pain syndrome 05/14/2018  . Iron deficiency anemia, unspecified 04/25/2018  . Hypoxia 12/14/2017  . Dyspnea 12/14/2017  . Secondary hyperparathyroidism of renal origin ([place]) 08/16/2017  . Degeneration of thoracic intervertebral disc 07/26/2017  . Moderate protein-calorie malnutrition ([place]) 02/23/2017  . Acute respiratory failure with hypoxia ([place]) 02/13/2017  . Hyperkalemia 02/13/2017  . Chronic respiratory failure with  hypoxia ([place]) 02/11/2017  . Debilitated patient 02/07/2017  . Poor tolerance for ambulation 02/07/2017  . Anemia in chronic kidney disease 02/04/2017   . Coagulation defect, unspecified ([place]) 02/04/2017  . Fracture of one rib, right side, initial encounter for closed fracture 01/11/2017  . Closed fracture of one rib of right side   . Chronic combined systolic and diastolic heart failure ([place]) 01/05/2017  . Palpitations 12/15/2016  . Hypokalemia 12/14/2016  . Leukocytosis 12/14/2016  . Long term (current) use of anticoagulants [Z79.01] 12/11/2016  . Non-ischemic cardiomyopathy ([place]) 12/07/2016  . Acute combined systolic and diastolic heart failure ([place])  12/07/2016  . Cardiac pacemaker   . Acute on chronic respiratory failure with hypoxia ([place])   . Acute pulmonary edema with congestive heart failure ([place]) 11/30/2016  . Elevated troponin level 11/30/2016  . Anemia 11/30/2016  . Chronic midline thoracic back pain 09/15/2016  . History of CVA (cerebrovascular accident) 09/15/2016  . Hyperlipidemia   . Nausea & vomiting 08/05/2016  . Dehydration 08/05/2016  . Osteoporosis 07/28/2016  . Closed compression fracture of thoracic vertebra ([place]) 07/23/2016  . Thoracic compression fracture ([place]) 07/22/2016  . Flank pain 07/11/2016  . Hypoalbuminemia 07/11/2016  . Renal artery stenosis ([place]) 06/24/2016  . Nondisplaced fracture of fifth metatarsal bone, left foot, initial encounter for closed fracture 06/10/2016  . Rib contusion, left, initial encounter 06/10/2016  . Single kidney 03/13/2016  . Chest pain   . Dyslipidemia   . PAF (paroxysmal atrial fibrillation) ([place]) 01/28/2016  . Essential hypertension 01/28/2016  . History of cardioembolic stroke 03/75/4360  . Personal history of subarachnoid hemorrhage 01/28/2016  . CKD (chronic kidney disease), stage IV ([place]) 01/28/2016  . Gait disturbance 01/27/2016  . Mitral valve disease 01/08/2014  . Colon polyps 07/10/2013  . Lumbar radiculopathy 02/04/2011   PCP:  Tammi Sou, MD Pharmacy:   [id number] [place], [address] [address] Phone: [phone] Fax: [phone]  [place], [place] [facility] [address] [place] [address] [place] [address] Phone: [phone] Fax: [phone]     Social Determinants of Health ([place]) Interventions    Readmission Risk Interventions Readmission Risk Prevention Plan 10/06/2018  Transportation Screening Complete  Medication Review ([place]) Complete  PCP or Specialist appointment within 3-5 days of discharge Complete  HRI or Home Care Consult Complete  SW Recovery Care/Counseling Consult Complete  Palliative Care Screening Not [place] Complete  Some recent data might be hidden

## 2019-09-01 NOTE — Telephone Encounter (Signed)
Returned phone call to daughter [name] who reported that patient has been hospitalized at [facility] due to diverticulitis and spontaneous spine fracture with a great deal of pain.  She receives hemodialysis 4 times per day.  Discussion has been for kyphoplasty after resolve of diverticulitis, possible SNF placement for rehab vs return to home. Plans for re-evaluation of continuation of dialysis treatments pending how successful current pain levels are improved.   Daughter is only caregiver at home. Plan for hospital liaisons to f/u with patient. [name], NP-C

## 2019-09-01 NOTE — Progress Notes (Signed)
Charge nurse called by patients RN. Pt trying to pull out IV and showing increased signs of disorientation. IV secured. Pt made comfortable. Oxygen placed so sats went from low 80s to 96%. Pt placed on continuous pulse ox. She denies abdominal pain although she has no audible bowel sounds. She denies nausea. Daughter agreed to come in and sit with mother to help calm her and reorient her when she becomes confused. She was resting quietly once her daughter arrived.

## 2019-09-01 NOTE — Consult Note (Addendum)
[facility] Renal Consultation Note    Indication for Consultation:  Management of ESRD/hemodialysis, anemia, hypertension/volume, and secondary hyperparathyroidism. PCP:  HPI: Valerie Terrell is a 86 y.o. female with ESRD, combined HF, COPD (on home O2 prn), Hx CVA, A-fib (off AC at this time) who was admitted with diverticulitis.  Pt presented to ED yesterday afternoon with severe back pain and abdominal pain. S/p recent admit with pulm edema. In ED, labs showed K 3.8, Ca 8.9, WBC 18, Hgb 13.1. COVID negative. Abdominal CT showed acute simoid diverticulitis and new T12 compression Fx. She was admitted for antibiotics and pain control.  Seen in room today - still with back pain. Denies N/V or diarrhea. No fever. No CP, dyspnea.  Dialyzes 4d/week d/t Hx CHF and inability to pull fluid adequately. She missed HD yesterday as she was in ED.   Past Medical History:  Diagnosis Date  . Anemia of chronic disease 2018   + anemia of CRI?  [name] Atrophy of left kidney    with absent blood flow by renal artery dopplers (Dr. Gwenlyn Found)  . 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 Chronic combined systolic and diastolic CHF (congestive heart failure) ([place])    a. 11/2016: echo showing EF of 35-40%, RV strain noted, mild MR and mild TR.   Echo showed much improved EF 12/2017 (55-60%)  . Chronic respiratory failure with hypoxia ([place]) 02/11/2017   Now on chronic O2 (intermittent as of summer 2019).    . Closed fracture of right superior pubic ramus ([place]) 07/18/2018   Non op mgmt (Dr. Victorino December was ortho who saw her in hosp but i don't think pt followed up with him).  . Closed right hip fracture ([place]) 10/04/2018   ORIF  . Debilitated patient    WC dependent as of 2018.  Hosp bed + full assistance with most ADLs s/p hip fx surg 09/2018.  [name] ESRD on hemodialysis ([place]) 01/2017   T/Th/Sat schedule- [place].  Right basilic AV fistula 37/1062.  Graft 2019.  [name] History of adenomatous  polyp of colon   . History of kidney stones   . History of subarachnoid hemorrhage 10/2014   after syncope and while on xarelto  . Hyperlipidemia   . Hypertension    Difficult to control, in the setting of one functioning kidney: pt was referred to nephrology by Dr. Gwenlyn Found 06/2015.  . Lumbar radiculopathy 2012  . Lumbar spinal stenosis 2019   facet injections only very short term relief.  L1 and L2 selective nerve root blocks helpful 04/2018.  . Lumbar spondylosis    MR 07/2016---no sign of spinal nerve compression or cord compression.  Pt set up with outpt ortho while admitted to hosp 07/2016.  . Malnourished ([place])   . Metatarsal fracture 06/10/2016   Nondisplaced, left 5th metatarsal--pt was referred to ortho  . Osteopenia 2014   T-score -2.1  . PAF (paroxysmal atrial fibrillation) (H[facility])    Eliquis started after BRAO and CVA.  She was changed to warfarin 2018. All anticoag stopped due to recurrent falls.  . Presence of permanent cardiac pacemaker   . Right rib fracture 12/2016   s/p fall  . Sick sinus syndrome ([place]) 11/2016   Dual chamber pacer insertion 2018 (Dr. Lovena Le)  . Stroke ([place]) 69/4854   cardioembolic (had CVA while on no anticoag)--"scattered subacute punctate infarcts: 1 in R parietal lobe and 2 in occipital cortex" on MRI br.  CT angio head/neck: aortic arch athero.  R ICA 20% stenosis, L ICA w/out any stenosis.  . Thoracic back pain 01/2017; 01/2018   2018: Facet?  Dr. Ramos->steroid injection.  01/2018-->CT T spine to check for a new comp fx-->none found.  Selective nerve root inj in T spine helpful on R side.   . Thoracic compression fracture ([place]) 07/2016   T3.  T7 and T8-- T8 kyphoplasty during hosp admission 07/2016.  Neuro referred pt to pain mgmt for consideration of injection 09/2016.  I referred her to endo 08/2016 for consideration of calcitonin treatment.   Past Surgical History:  Procedure Laterality Date  . APPENDECTOMY  child  . AV FISTULA PLACEMENT Right  03/31/2017   Procedure: Right ARM Basilic vein transposition;  Surgeon: Conrad Johnson Creek, MD;  Location: [facility] OR;  Service: Vascular;  Laterality: Right;  . AV FISTULA PLACEMENT Right 12/06/2017   Procedure: INSERTION OF ARTERIOVENOUS (AV) GORE-TEX GRAFT ARM RIGHT UPPER EXTREMITY;  Surgeon: [name], MD;  Location: [facility];  Service: Vascular;  Laterality: Right;  . CARDIOVASCULAR STRESS TEST  01/31/2016   Stress myoview: NORMAL/Low risk.  EF 56%.  . [place]  . COLONOSCOPY  2015   + hx of adenomatous polyps.  Need digest health spec in [place] records to see when pt due for next colonoscopy  . DECLOT CKV Right 03/21/2019  . EYE SURGERY Bilateral    Catarct  . INSERTION OF DIALYSIS CATHETER Right 01/29/2017   Procedure: INSERTION OF DIALYSIS CATHETER- RIGHT INTERNAL JUGULAR;  Surgeon: [name], MD;  Location: [facility];  Service: Vascular;  Laterality: Right;  . INTRAMEDULLARY (IM) NAIL INTERTROCHANTERIC Right 10/04/2018   Procedure: INTRAMEDULLARY (IM) NAIL INTERTROCHANTRIC;  Surgeon: Nicholes Stairs, MD;  Location: [address];  Service: Orthopedics;  Laterality: Right;  . IR GENERIC HISTORICAL  07/24/2016   IR KYPHO THORACIC WITH BONE BIOPSY 07/24/2016 Luanne Bras, MD [facility]INTER[facility]  . KYPHOPLASTY  07/27/2016   T8  . PACEMAKER IMPLANT N/A 12/03/2016   Procedure: Pacemaker Implant;  Surgeon: Evans Lance, MD;  Location: [facility] CV LAB;  Service: Cardiovascular;  Laterality: N/A;  . Renal artery dopplers  02/26/2016   Her right renal dimension was 11 cm pole to pole with mild to moderate right renal artery stenosis. A right renal aortic ratio was 3.22 suggesting less than a 50% stenosis.  . Right upper arm AV Gore-Tex graft  12/06/2017   Dr. Donnetta Hutching  . TONSILLECTOMY    . TRANSTHORACIC ECHOCARDIOGRAM  01/29/2016; 11/2016; 12/2017   2017: EF 55-60%, normal LV wall motion, grade I DD.  No cardiac source of emboli was seen.  2018: EF 35-40%, could not assess DD due to a-fib,  pulm HTN noted. 12/2017 EF 55-60%, nl wall motion, grd II DD, mod MR and pulm regurg, increased pulm pressure (peak 52).   Family History  Problem Relation Age of Onset  . Cancer Mother   . Heart disease Father   . Early death Father   . Sudden Cardiac Death Neg Hx    Social History:  reports that she quit smoking about 47 years ago. She quit after 22.00 years of use. She has never used smokeless tobacco. She reports current alcohol use of about 1.0 standard drinks of alcohol per week. She reports that she does not use drugs.  ROS: As per HPI otherwise negative.  Physical Exam: Vitals:   08/31/19 2238 09/01/19 0100 09/01/19 0432 09/01/19 0825  BP: (!) 129/47 (!) 151/45 (!) 133/50 (!) 139/49  Pulse: 75 78 73 72  Resp: 19 18 15 17   Temp:  98.4 F (36.9 C) 98.4 F (36.9 C) 98 F (36.7 C)  TempSrc:  Oral Oral Oral  SpO2: 99% 92% 91% 92%     General: Frail woman, NAD. Nasal O2 given to patient, she has handing around neck for now Head: Normocephalic, atraumatic, sclera non-icteric, mucus membranes are moist. Neck: Supple without lymphadenopathy/masses. JVD not elevated. Lungs: Clear bilaterally to auscultation without wheezes, rales, or rhonchi. Breathing is unlabored. Heart: RRR, 3/6 murmur. No murmurs, rubs, or gallops appreciated. Abdomen: Soft, non-tender, non-distended with normoactive bowel sounds. No rebound/guarding. No obvious abdominal masses. Musculoskeletal:  Strength and tone appear normal for age. Lower extremities: 1+ RLE edema, no LLE edema Neuro: Alert and oriented X 3. Moves all extremities spontaneously. Psych:  Responds to questions appropriately with a normal affect. Dialysis Access: AVG + bruit  Allergies  Allergen Reactions  . Clonidine Derivatives Palpitations and Other (See Comments)    VERY SEDATED  . Adhesive [Tape] Other (See Comments)    Tears skin up  . Codeine Nausea Only  . Nickel Rash  . Sulfa Antibiotics Other (See Comments)    Reaction  unknown Did not feel well when taken Reaction unknown   Prior to Admission medications   Medication Sig Start Date End Date Taking? Authorizing Provider  acetaminophen (TYLENOL) 500 MG tablet Take 500-1,000 mg 3 (three) times daily as needed by mouth for moderate pain.    Yes [provider]  amiodarone (PACERONE) 200 MG tablet TAKE 1 TABLET BY MOUTH  DAILY Patient taking differently: Take 200 mg by mouth daily.  03/27/19  Yes Lorretta Harp, MD  Artificial Saliva (ACT DRY MOUTH) LOZG Use as directed 1 lozenge in the mouth or throat daily as needed (for dry mouth).   Yes [provider]  aspirin EC 325 MG tablet Take 325 mg by mouth every evening.   Yes [provider]  b complex-vitamin c-folic acid (NEPHRO-VITE) 0.8 MG TABS tablet Take 1 tablet by mouth See admin instructions. Take one tablet by mouth on Tuesday, Thursday, Saturday - after dialysis 09/07/18  Yes [provider]  calcitonin, salmon, (MIACALCIN/FORTICAL) 200 UNIT/ACT nasal spray Place 1 spray into alternate nostrils daily.  07/06/19  Yes [provider]  D-5000 125 MCG (5000 UT) TABS Take 1 tablet by mouth daily. 08/18/19  Yes [provider]  DENTA 5000 PLUS 1.1 % CREA dental cream Place 1 application onto teeth at bedtime.  04/06/19  Yes [provider]  docusate sodium (COLACE) 100 MG capsule Take 100 mg by mouth daily as needed for mild constipation.    Yes [provider]  doxercalciferol (HECTOROL) 2.5 MCG capsule Take 2.5 mcg by mouth 3 (three) times a week.  02/28/19 02/27/20 Yes [provider]  fentaNYL (DURAGESIC) 50 MCG/HR Place 1 patch onto the skin every 3 (three) days. 08/21/19  Yes McGowen, Adrian Blackwater, MD  heparin 1000 unit/mL SOLN injection 1,000 Units by Dialysis route one time in dialysis.  02/07/19 02/06/20 Yes [provider]  isosorbide mononitrate (IMDUR) 60 MG 24 hr tablet TAKE 1 TABLET BY MOUTH  DAILY ; TAKE 1 TABLET ON  SUN,  MON, WED, FRI MORNINGS AND 1 TABLET ON TUES, THU,  SAT AFTER DIALYSIS Patient taking differently: Take 60 mg by mouth daily.  07/21/19  Yes McGowen, Adrian Blackwater, MD  Lidocaine 4 % PTCH Apply 1 patch topically daily as needed (Mild pain).  Yes [provider]  lidocaine-prilocaine (EMLA) cream Apply 1 application topically See admin instructions. Apply small amount to access site (AVF) one hour before dialysis, cover with occlusive dressing (saran wrap) 11/11/18  Yes [provider]  Multiple Vitamins-Minerals (MULTIVITAMIN WITH MINERALS) tablet Take 1 tablet by mouth at bedtime.    Yes [provider]  ondansetron (ZOFRAN) 4 MG tablet Take 1 tablet (4 mg total) by mouth every 8 (eight) hours as needed for nausea or vomiting. 07/12/19  Yes McGowen, Adrian Blackwater, MD  Oxycodone HCl 10 MG TABS 1-2 tabs po q6h prn severe pain Patient taking differently: Take 20 mg by mouth every 6 (six) hours as needed (for pain).  08/21/19  Yes McGowen, Adrian Blackwater, MD  polyvinyl alcohol (LIQUIFILM TEARS) 1.4 % ophthalmic solution Place 1-2 drops into both eyes daily as needed for dry eyes.    Yes [provider]  pravastatin (PRAVACHOL) 40 MG tablet TAKE 1 TABLET BY MOUTH  EVERY EVENING Patient taking differently: Take 40 mg by mouth at bedtime.  03/09/18  Yes McGowen, Adrian Blackwater, MD  sevelamer carbonate (RENVELA) 800 MG tablet Take 1,600 mg by mouth 3 (three) times daily with meals.    Yes [provider]  vitamin C (ASCORBIC ACID) 500 MG tablet Take 500 mg by mouth daily.   Yes [provider]  Vitamin D, Ergocalciferol, (DRISDOL) 1.25 MG (50000 UT) CAPS capsule Take 1 capsule (50,000 Units total) by mouth every Thursday. 04/28/18  Yes McGowen, Adrian Blackwater, MD  naloxone [facility]) 0.4 MG/ML injection Inject IM as needed for reversal of opioid pain medication Patient not taking: Reported on 08/31/2019 07/20/19   Tammi Sou, MD   Current Facility-Administered Medications  Medication  Dose Route Frequency Provider Last Rate Last Admin  . amiodarone (PACERONE) tablet 200 mg  200 mg Oral Daily Rise Patience, MD   200 mg at 09/01/19 0905  . aspirin EC tablet 325 mg  325 mg Oral QPM Rise Patience, MD      . calcitonin (salmon) (MIACALCIN/FORTICAL) nasal spray 1 spray  1 spray Alternating Nares Daily Rise Patience, MD   1 spray at 09/01/19 0906  . cefTRIAXone (ROCEPHIN) 2 g in sodium chloride 0.9 % 100 mL IVPB  2 g Intravenous Q24H Rise Patience, MD      . Chlorhexidine Gluconate Cloth 2 % PADS 6 each  6 each Topical Daily Rise Patience, MD   6 each at 09/01/19 [phone]  . docusate sodium (COLACE) capsule 100 mg  100 mg Oral Daily PRN Rise Patience, MD      . Derrill Memo ON 09/02/2019] fentaNYL (DURAGESIC) 50 MCG/HR 1 patch  1 patch Transdermal Q72H Rise Patience, MD      . heparin injection 5,000 Units  5,000 Units Subcutaneous Q8H Rise Patience, MD   5,000 Units at 09/01/19 0504  . HYDROmorphone (DILAUDID) injection 0.5 mg  0.5 mg Intravenous Q3H PRN [place] Eric J, DO      . isosorbide mononitrate (IMDUR) 24 hr tablet 60 mg  60 mg Oral Daily Rise Patience, MD      . metroNIDAZOLE (FLAGYL) IVPB 500 mg  500 mg Intravenous Q8H Rise Patience, MD 100 mL/hr at 09/01/19 0505 500 mg at 09/01/19 0505  . ondansetron (ZOFRAN) tablet 4 mg  4 mg Oral Q6H PRN Rise Patience, MD       Or  . ondansetron [facility]) injection 4 mg  4 mg Intravenous  Q6H PRN Rise Patience, MD   4 mg at 09/01/19 2426  . oxyCODONE (Oxy IR/ROXICODONE) immediate release tablet 10-20 mg  10-20 mg Oral Q6H PRN Rise Patience, MD   10 mg at 09/01/19 8341  . pravastatin (PRAVACHOL) tablet 40 mg  40 mg Oral QHS Rise Patience, MD      . sevelamer carbonate (RENVELA) tablet 1,600 mg  1,600 mg Oral TID WC Rise Patience, MD   1,600 mg at 09/01/19 0905  . sodium chloride flush (NS) 0.9 % injection 3 mL  3 mL Intravenous Once Rise Patience, MD       Labs: Basic Metabolic Panel: Recent Labs  Lab 08/31/19 1300 09/01/19 0449  NA 137 133*  K 3.8 4.1  CL 92* 91*  CO2 28 27  GLUCOSE 122* 88  BUN 16 25*  CREATININE 4.23* 4.98*  CALCIUM 8.9 8.4*   Liver Function Tests: Recent Labs  Lab 08/31/19 1300  AST 24  ALT 15  ALKPHOS 102  BILITOT 0.7  PROT 6.8  ALBUMIN 3.1*   Recent Labs  Lab 08/31/19 1300  LIPASE 27   CBC: Recent Labs  Lab 08/31/19 1300 09/01/19 0449  WBC 18.0* 12.9*  HGB 13.1 11.0*  HCT 42.7 34.9*  MCV 100.0 97.8  PLT 189 154   Studies/Results: CT ABDOMEN PELVIS WO CONTRAST  Result Date: 08/31/2019 CLINICAL DATA:  Acute on chronic back pain for several days, emesis, short of breath EXAM: CT ABDOMEN AND PELVIS WITHOUT CONTRAST TECHNIQUE: Multidetector CT imaging of the abdomen and pelvis was performed following the standard protocol without IV contrast. COMPARISON:  12/13/2018 FINDINGS: Lower chest: Hypoventilatory changes are seen at the lung bases. There is chronic scarring at the left lung base. Diffuse atherosclerosis of the aorta and coronary vessels again noted. Extensive calcification of the aortic and mitral valves. Pacemaker unchanged. Hepatobiliary: Gallbladder sludge is identified without cholecystitis. The liver is unremarkable. Pancreas: Unremarkable. No pancreatic ductal dilatation or surrounding inflammatory changes. Spleen: Normal in size without focal abnormality. Adrenals/Urinary Tract: Progressive bilateral renal cortical atrophy consistent with end-stage renal disease. Stable cysts right kidney. No urinary tract calculi or obstruction. Bladder is unremarkable. The adrenals are unremarkable. Stomach/Bowel: There is circumferential wall thickening of the mid sigmoid colon, reference image 64. Mild pericolonic fat stranding consistent with acute colitis or diverticulitis. No bowel obstruction or ileus. The appendix is surgically absent. Vascular/Lymphatic: There is severe  atherosclerosis throughout the aorta and its branches. No pathologic adenopathy. Reproductive: Uterus is markedly atrophic.  No adnexal masses. Other: No free fluid or free gas. Diffuse atrophy of the abdominal wall musculature unchanged. Musculoskeletal: Chronic T10 compression deformity with previous vertebral augmentation. There is an age-indeterminate T12 compression fracture, new since prior CT. Less than 25% loss of height. No significant paraspinal hematoma. No additional fractures. Postsurgical changes right hip. Reconstructed images demonstrate no additional findings. IMPRESSION: 1. Circumferential wall thickening of the mid sigmoid colon with mild pericolonic fat stranding, consistent with acute uncomplicated diverticulitis or colitis. 2. New T12 compression deformity, with less than 25% loss of height. Based on appearance, this could reflect an acute or subacute fracture. 3. Sequela of end-stage renal disease. 4. Aortic Atherosclerosis (ICD10-I70.0). Severe coronary artery atherosclerosis. Electronically Signed   By: Randa Ngo M.D.   On: 08/31/2019 19:37   DG Chest Port 1 View  Result Date: 08/31/2019 CLINICAL DATA:  Shortness of breath EXAM: PORTABLE CHEST 1 VIEW COMPARISON:  August 16, 2019 and April 13, 2019 FINDINGS:  There is suspected scarring in the left base. There may also be epicardial fat prominence in this area. The appearance is stable. There is no apparent edema or airspace opacity. Heart is upper normal in size with pulmonary vascularity normal. Pacemaker lead tips are attached to the right atrium and right ventricle. No adenopathy. There is aortic atherosclerosis. Bones are osteoporotic. Patient is undergone prior kyphoplasty procedure in the lower thoracic region IMPRESSION: Suspect a degree of scarring in the lateral left base. No edema or airspace opacity. Stable cardiac silhouette. Pacemaker leads attached to right atrium and right ventricle. Bones osteoporotic. Aortic  Atherosclerosis (ICD10-I70.0). Electronically Signed   By: Lowella Grip III M.D.   On: 08/31/2019 15:29   Dialysis Orders:  MTTS at [facility] 4hr, 350/600, EDW 51kg, 2K/2Ca, AVG, heparin 1400 bolus - Hectoral 76mcg IV q HD - NO ESA, Hgb > 11  Assessment/Plan: 1.  Acute sigmoid diverticulitis: On Ceftriaxone + Flagyl.  2.  Back pain: New T12 compression Fx on imaging. Pain control per primary. On nasal calcitonin - watch Ca levels. 3.  ESRD: MTTS schedule recently - missed last HD. No acute need for HD today - will pick up with schedule tomorrow (4/17). 4.  Hypertension/volume: BP ok - some LE edema, UF as toelrated. 5.  Anemia: Hgb 11 - follow without ESA for now. 6.  Metabolic bone disease: Ca ok, Phos pending. Continue home meds and VDRA. 7.  Nutrition:  Alb low - will add Nepro. 8. COPD  [name], PA-C 09/01/2019, 11:58 AM  [facility]  I have seen and examined this patient and agree with plan and assessment in the above note with renal recommendations/intervention highlighted.  Her pain is better but she has been having nausea and anorexia at the moment.  Does not look comfortable.  Somewhat groggy after phenergan.  No respiratory distress, and plan for HD tomorrow to keep on her schedule while she remains and inpatient.  Governor Rooks Pierra Skora,MD 09/01/2019 3:23 PM

## 2019-09-01 NOTE — Plan of Care (Signed)
  Problem: Pain Managment: Goal: General experience of comfort will improve Outcome: Progressing   

## 2019-09-01 NOTE — H&P (Signed)
History and Physical    Jordann Grime CLE:[id number] DOB: Apr 13, 1933 DOA: 08/31/2019  PCP: Tammi Sou, MD  Patient coming from: Home.  Chief Complaint: Abdominal pain.  HPI: Valerie Terrell is a 86 y.o. female with history of ESRD on hemodialysis on Tuesday Thursday Saturdays who was recently admitted for fluid overload and discharged on August 17, 2019 presents to the ER because of abdominal pain.  Patient states she has been having abdominal pain generalized for the last 1 week which has been gradually worsening.  Over the last 2 days patient also had some nausea vomiting.  Denies any blood in the vomitus.  Denies any fall or trauma.  States she does not miss her dialysis.  Denies chest pain or shortness of breath.  No fever chills.  ED Course: In the ER CT scan of the abdomen pelvis shows diverticulitis of sigmoid colon and also a new T12 compression fracture.  Patient is able to move extremities and denies any incontinence of urine or bowel.  Labs show WBC count of 18 potassium 3.8 creatinine 4.2 LFTs normal except for albumin of 3.1.  EKG shows normal sinus rhythm.  Covid test was negative.  On exam abdomen appears benign.  Patient is able to move all extremities.  Patient was started on ceftriaxone and Flagyl for diverticulitis.  Review of Systems: As per HPI, rest all negative.   Past Medical History:  Diagnosis Date  . Anemia of chronic disease 2018   + anemia of CRI?  [name] Atrophy of left kidney    with absent blood flow by renal artery dopplers (Dr. Gwenlyn Found)  . 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 Chronic combined systolic and diastolic CHF (congestive heart failure) ([place])    a. 11/2016: echo showing EF of 35-40%, RV strain noted, mild MR and mild TR.   Echo showed much improved EF 12/2017 (55-60%)  . Chronic respiratory failure with hypoxia ([place]) 02/11/2017   Now on chronic O2 (intermittent as of summer 2019).    . Closed fracture of right superior pubic ramus ([place])  07/18/2018   Non op mgmt (Dr. Victorino December was ortho who saw her in hosp but i don't think pt followed up with him).  . Closed right hip fracture ([place]) 10/04/2018   ORIF  . Debilitated patient    WC dependent as of 2018.  Hosp bed + full assistance with most ADLs s/p hip fx surg 09/2018.  [name] ESRD on hemodialysis ([place]) 01/2017   T/Th/Sat schedule- [place].  Right basilic AV fistula [id number]/4967.  Graft 2019.  [name] History of adenomatous polyp of colon   . History of kidney stones   . History of subarachnoid hemorrhage 10/2014   after syncope and while on xarelto  . Hyperlipidemia   . Hypertension    Difficult to control, in the setting of one functioning kidney: pt was referred to nephrology by Dr. Gwenlyn Found 06/2015.  . Lumbar radiculopathy 2012  . Lumbar spinal stenosis 2019   facet injections only very short term relief.  L1 and L2 selective nerve root blocks helpful 04/2018.  . Lumbar spondylosis    MR 07/2016---no sign of spinal nerve compression or cord compression.  Pt set up with outpt ortho while admitted to hosp 07/2016.  . Malnourished ([place])   . Metatarsal fracture 06/10/2016   Nondisplaced, left 5th metatarsal--pt was referred to ortho  . Osteopenia 2014   T-score -2.1  . PAF (paroxysmal atrial fibrillation) (HCC)    Eliquis  started after BRAO and CVA.  She was changed to warfarin 2018. All anticoag stopped due to recurrent falls.  . Presence of permanent cardiac pacemaker   . Right rib fracture 12/2016   s/p fall  . Sick sinus syndrome ([place]) 11/2016   Dual chamber pacer insertion 2018 (Dr. Lovena Le)  . Stroke ([place]) 60/4540   cardioembolic (had CVA while on no anticoag)--"scattered subacute punctate infarcts: 1 in R parietal lobe and 2 in occipital cortex" on MRI br.  CT angio head/neck: aortic arch athero.  R ICA 20% stenosis, L ICA w/out any stenosis.  . Thoracic back pain 01/2017; 01/2018   2018: Facet?  Dr. Ramos->steroid injection.  01/2018-->CT T spine to check for a new comp  fx-->none found.  Selective nerve root inj in T spine helpful on R side.   . Thoracic compression fracture ([place]) 07/2016   T3.  T7 and T8-- T8 kyphoplasty during hosp admission 07/2016.  Neuro referred pt to pain mgmt for consideration of injection 09/2016.  I referred her to endo 08/2016 for consideration of calcitonin treatment.    Past Surgical History:  Procedure Laterality Date  . APPENDECTOMY  child  . AV FISTULA PLACEMENT Right 03/31/2017   Procedure: Right ARM Basilic vein transposition;  Surgeon: Conrad Abernathy, MD;  Location: [facility] OR;  Service: Vascular;  Laterality: Right;  . AV FISTULA PLACEMENT Right 12/06/2017   Procedure: INSERTION OF ARTERIOVENOUS (AV) GORE-TEX GRAFT ARM RIGHT UPPER EXTREMITY;  Surgeon: [name], MD;  Location: [facility];  Service: Vascular;  Laterality: Right;  . CARDIOVASCULAR STRESS TEST  01/31/2016   Stress myoview: NORMAL/Low risk.  EF 56%.  . [place]  . COLONOSCOPY  2015   + hx of adenomatous polyps.  Need digest health spec in [place] records to see when pt due for next colonoscopy  . DECLOT CKV Right 03/21/2019  . EYE SURGERY Bilateral    Catarct  . INSERTION OF DIALYSIS CATHETER Right 01/29/2017   Procedure: INSERTION OF DIALYSIS CATHETER- RIGHT INTERNAL JUGULAR;  Surgeon: [name], MD;  Location: [facility];  Service: Vascular;  Laterality: Right;  . INTRAMEDULLARY (IM) NAIL INTERTROCHANTERIC Right 10/04/2018   Procedure: INTRAMEDULLARY (IM) NAIL INTERTROCHANTRIC;  Surgeon: Nicholes Stairs, MD;  Location: [facility];  Service: Orthopedics;  Laterality: Right;  . IR GENERIC HISTORICAL  07/24/2016   IR KYPHO THORACIC WITH BONE BIOPSY 07/24/2016 Luanne Bras, MD [facility]INTER[facility] RAD  . KYPHOPLASTY  07/27/2016   T8  . PACEMAKER IMPLANT N/A 12/03/2016   Procedure: Pacemaker Implant;  Surgeon: Evans Lance, MD;  Location: [facility];  Service: Cardiovascular;  Laterality: N/A;  . Renal artery dopplers  02/26/2016   Her right renal  dimension was 11 cm pole to pole with mild to moderate right renal artery stenosis. A right renal aortic ratio was 3.22 suggesting less than a 50% stenosis.  . Right upper arm AV Gore-Tex graft  12/06/2017   Dr. Donnetta Hutching  . TONSILLECTOMY    . TRANSTHORACIC ECHOCARDIOGRAM  01/29/2016; 11/2016; 12/2017   2017: EF 55-60%, normal LV wall motion, grade I DD.  No cardiac source of emboli was seen.  2018: EF 35-40%, could not assess DD due to a-fib, pulm HTN noted. 12/2017 EF 55-60%, nl wall motion, grd II DD, mod MR and pulm regurg, increased pulm pressure (peak 52).     reports that she quit smoking about 47 years ago. She quit after 22.00 years of use. She has never used  smokeless tobacco. She reports current alcohol use of about 1.0 standard drinks of alcohol per week. She reports that she does not use drugs.  Allergies  Allergen Reactions  . Clonidine Derivatives Palpitations and Other (See Comments)    VERY SEDATED  . Adhesive [Tape] Other (See Comments)    Tears skin up  . Codeine Nausea Only  . Nickel Rash  . Sulfa Antibiotics Other (See Comments)    Reaction unknown Did not feel well when taken Reaction unknown    Family History  Problem Relation Age of Onset  . Cancer Mother   . Heart disease Father   . Early death Father   . Sudden Cardiac Death Neg Hx     Prior to Admission medications   Medication Sig Start Date End Date Taking? Authorizing Provider  acetaminophen (TYLENOL) 500 MG tablet Take 500-1,000 mg 3 (three) times daily as needed by mouth for moderate pain.    Yes [provider]  amiodarone (PACERONE) 200 MG tablet TAKE 1 TABLET BY MOUTH  DAILY Patient taking differently: Take 200 mg by mouth daily.  03/27/19  Yes Lorretta Harp, MD  Artificial Saliva (ACT DRY MOUTH) LOZG Use as directed 1 lozenge in the mouth or throat daily as needed (for dry mouth).   Yes [provider]  aspirin EC 325 MG tablet Take 325 mg by mouth every evening.   Yes [provider]  b complex-vitamin c-folic acid (NEPHRO-VITE) 0.8 MG TABS tablet Take 1 tablet by mouth See admin instructions. Take one tablet by mouth on Tuesday, Thursday, Saturday - after dialysis 09/07/18  Yes [provider]  calcitonin, salmon, (MIACALCIN/FORTICAL) 200 UNIT/ACT nasal spray Place 1 spray into alternate nostrils daily.  07/06/19  Yes [provider]  D-5000 125 MCG (5000 UT) TABS Take 1 tablet by mouth daily. 08/18/19  Yes [provider]  DENTA 5000 PLUS 1.1 % CREA dental cream Place 1 application onto teeth at bedtime.  04/06/19  Yes [provider]  docusate sodium (COLACE) 100 MG capsule Take 100 mg by mouth daily as needed for mild constipation.    Yes [provider]  doxercalciferol (HECTOROL) 2.5 MCG capsule Take 2.5 mcg by mouth 3 (three) times a week.  02/28/19 02/27/20 Yes [provider]  fentaNYL (DURAGESIC) 50 MCG/HR Place 1 patch onto the skin every 3 (three) days. 08/21/19  Yes McGowen, Adrian Blackwater, MD  heparin 1000 unit/mL SOLN injection 1,000 Units by Dialysis route one time in dialysis.  02/07/19 02/06/20 Yes [provider]  isosorbide mononitrate (IMDUR) 60 MG 24 hr tablet TAKE 1 TABLET BY MOUTH  DAILY ; TAKE 1 TABLET ON  SUN, MON, WED, FRI MORNINGS AND 1 TABLET ON TUES, THU,  SAT AFTER DIALYSIS Patient taking differently: Take 60 mg by mouth daily.  07/21/19  Yes McGowen, Adrian Blackwater, MD  Lidocaine 4 % PTCH Apply 1 patch topically daily as needed (Mild pain).   Yes [provider]  lidocaine-prilocaine (EMLA) cream Apply 1 application topically See admin instructions. Apply small amount to access site (AVF) one hour before dialysis, cover with occlusive dressing (saran wrap) 11/11/18  Yes [provider]  Multiple Vitamins-Minerals (MULTIVITAMIN WITH MINERALS) tablet Take 1 tablet by mouth at bedtime.    Yes [provider]  ondansetron (ZOFRAN) 4 MG tablet Take 1 tablet (4 mg total) by  mouth every 8 (eight) hours as needed for nausea or vomiting. 07/12/19  Yes McGowen, Adrian Blackwater, MD  Oxycodone  HCl 10 MG TABS 1-2 tabs po q6h prn severe pain Patient taking differently: Take 20 mg by mouth every 6 (six) hours as needed (for pain).  08/21/19  Yes McGowen, Adrian Blackwater, MD  polyvinyl alcohol (LIQUIFILM TEARS) 1.4 % ophthalmic solution Place 1-2 drops into both eyes daily as needed for dry eyes.    Yes [provider]  pravastatin (PRAVACHOL) 40 MG tablet TAKE 1 TABLET BY MOUTH  EVERY EVENING Patient taking differently: Take 40 mg by mouth at bedtime.  03/09/18  Yes McGowen, Adrian Blackwater, MD  sevelamer carbonate (RENVELA) 800 MG tablet Take 1,600 mg by mouth 3 (three) times daily with meals.    Yes [provider]  vitamin C (ASCORBIC ACID) 500 MG tablet Take 500 mg by mouth daily.   Yes [provider]  Vitamin D, Ergocalciferol, (DRISDOL) 1.25 MG (50000 UT) CAPS capsule Take 1 capsule (50,000 Units total) by mouth every Thursday. 04/28/18  Yes McGowen, Adrian Blackwater, MD  naloxone [facility]) 0.4 MG/ML injection Inject IM as needed for reversal of opioid pain medication Patient not taking: Reported on 08/31/2019 07/20/19   Tammi Sou, MD    Physical Exam: Constitutional: Moderately built and nourished. Vitals:   08/31/19 1815 08/31/19 1830 08/31/19 2030 08/31/19 2238  BP:   137/62 (!) 129/47  Pulse: 73 73 78 75  Resp:   15 19  Temp:      TempSrc:      SpO2: 98% 96% 97% 99%   Eyes: Anicteric no pallor. ENMT: No discharge from the ears eyes nose or mouth. Neck: No mass felt.  No neck rigidity. Respiratory: No rhonchi or crepitations. Cardiovascular: S1-S2 heard. Abdomen: Soft nontender bowel sounds present. Musculoskeletal: No edema. Skin: No rash. Neurologic: Alert awake oriented to time place and person.  Moves all extremities. Psychiatric: Appears normal per normal affect.   Labs on Admission: I have personally reviewed following labs and imaging  studies  CBC: Recent Labs  Lab 08/31/19 1300  WBC 18.0*  HGB 13.1  HCT 42.7  MCV 100.0  PLT 488   Basic Metabolic Panel: Recent Labs  Lab 08/31/19 1300  NA 137  K 3.8  CL 92*  CO2 28  GLUCOSE 122*  BUN 16  CREATININE 4.23*  CALCIUM 8.9   GFR: CrCl cannot be calculated (Unknown ideal weight.). Liver Function Tests: Recent Labs  Lab 08/31/19 1300  AST 24  ALT 15  ALKPHOS 102  BILITOT 0.7  PROT 6.8  ALBUMIN 3.1*   Recent Labs  Lab 08/31/19 1300  LIPASE 27   No results for input(s): AMMONIA in the last 168 hours. Coagulation Profile: No results for input(s): INR, PROTIME in the last 168 hours. Cardiac Enzymes: No results for input(s): CKTOTAL, CKMB, CKMBINDEX, TROPONINI in the last 168 hours. BNP (last 3 results) No results for input(s): PROBNP in the last 8760 hours. HbA1C: No results for input(s): HGBA1C in the last 72 hours. CBG: No results for input(s): GLUCAP in the last 168 hours. Lipid Profile: No results for input(s): CHOL, HDL, LDLCALC, TRIG, CHOLHDL, LDLDIRECT in the last 72 hours. Thyroid Function Tests: No results for input(s): TSH, T4TOTAL, FREET4, T3FREE, THYROIDAB in the last 72 hours. Anemia Panel: No results for input(s): VITAMINB12, FOLATE, FERRITIN, TIBC, IRON, RETICCTPCT in the last 72 hours. Urine analysis:    Component Value Date/Time   COLORURINE YELLOW 01/20/2017 [place] 01/20/2017 1737   LABSPEC 1.012 01/20/2017 1737   PHURINE 5.0 01/20/2017 1737  GLUCOSEU NEGATIVE 01/20/2017 [place] 01/20/2017 1737   BILIRUBINUR NEGATIVE 01/20/2017 1737   BILIRUBINUR negative 09/24/2016 [place] 01/20/2017 1737   PROTEINUR 30 (A) 01/20/2017 1737   UROBILINOGEN 0.2 09/24/2016 1453   NITRITE NEGATIVE 01/20/2017 1737   LEUKOCYTESUR NEGATIVE 01/20/2017 1737   Sepsis Labs: @LABRCNTIP (procalcitonin:4,lacticidven:4) )No results found for this or any previous visit (from the past 240  hour(s)).   Radiological Exams on Admission: CT ABDOMEN PELVIS WO CONTRAST  Result Date: 08/31/2019 CLINICAL DATA:  Acute on chronic back pain for several days, emesis, short of breath EXAM: CT ABDOMEN AND PELVIS WITHOUT CONTRAST TECHNIQUE: Multidetector CT imaging of the abdomen and pelvis was performed following the standard protocol without IV contrast. COMPARISON:  12/13/2018 FINDINGS: Lower chest: Hypoventilatory changes are seen at the lung bases. There is chronic scarring at the left lung base. Diffuse atherosclerosis of the aorta and coronary vessels again noted. Extensive calcification of the aortic and mitral valves. Pacemaker unchanged. Hepatobiliary: Gallbladder sludge is identified without cholecystitis. The liver is unremarkable. Pancreas: Unremarkable. No pancreatic ductal dilatation or surrounding inflammatory changes. Spleen: Normal in size without focal abnormality. Adrenals/Urinary Tract: Progressive bilateral renal cortical atrophy consistent with end-stage renal disease. Stable cysts right kidney. No urinary tract calculi or obstruction. Bladder is unremarkable. The adrenals are unremarkable. Stomach/Bowel: There is circumferential wall thickening of the mid sigmoid colon, reference image 64. Mild pericolonic fat stranding consistent with acute colitis or diverticulitis. No bowel obstruction or ileus. The appendix is surgically absent. Vascular/Lymphatic: There is severe atherosclerosis throughout the aorta and its branches. No pathologic adenopathy. Reproductive: Uterus is markedly atrophic.  No adnexal masses. Other: No free fluid or free gas. Diffuse atrophy of the abdominal wall musculature unchanged. Musculoskeletal: Chronic T10 compression deformity with previous vertebral augmentation. There is an age-indeterminate T12 compression fracture, new since prior CT. Less than 25% loss of height. No significant paraspinal hematoma. No additional fractures. Postsurgical changes right hip.  Reconstructed images demonstrate no additional findings. IMPRESSION: 1. Circumferential wall thickening of the mid sigmoid colon with mild pericolonic fat stranding, consistent with acute uncomplicated diverticulitis or colitis. 2. New T12 compression deformity, with less than 25% loss of height. Based on appearance, this could reflect an acute or subacute fracture. 3. Sequela of end-stage renal disease. 4. Aortic Atherosclerosis (ICD10-I70.0). Severe coronary artery atherosclerosis. Electronically Signed   By: Randa Ngo M.D.   On: 08/31/2019 19:37   DG Chest Port 1 View  Result Date: 08/31/2019 CLINICAL DATA:  Shortness of breath EXAM: PORTABLE CHEST 1 VIEW COMPARISON:  August 16, 2019 and April 13, 2019 FINDINGS: There is suspected scarring in the left base. There may also be epicardial fat prominence in this area. The appearance is stable. There is no apparent edema or airspace opacity. Heart is upper normal in size with pulmonary vascularity normal. Pacemaker lead tips are attached to the right atrium and right ventricle. No adenopathy. There is aortic atherosclerosis. Bones are osteoporotic. Patient is undergone prior kyphoplasty procedure in the lower thoracic region IMPRESSION: Suspect a degree of scarring in the lateral left base. No edema or airspace opacity. Stable cardiac silhouette. Pacemaker leads attached to right atrium and right ventricle. Bones osteoporotic. Aortic Atherosclerosis (ICD10-I70.0). Electronically Signed   By: Lowella Grip III M.D.   On: 08/31/2019 15:29    EKG: Independently reviewed.  Normal sinus rhythm.  Assessment/Plan Principal Problem:   Diverticulitis Active Problems:   PAF (paroxysmal atrial fibrillation) (HCC)   Essential hypertension  History of CVA (cerebrovascular accident)   Cardiac pacemaker   Non-ischemic cardiomyopathy ([place])   ESRD (end stage renal disease) ([place])   T12 compression fracture ([place])    1. Acute sigmoid diverticulitis  -patient has been placed on ceftriaxone Flagyl and closely observe.  If there is any further vomiting may need to consult gastroenterology/surgery.  Abdomen appears benign on exam. 2. New T12 compression fracture -patient does not complain of any back pain and states she has no limitation of mobility.  She uses a walker.  Closely observe.  If physical therapy consult.  May discuss with orthopedics in the morning. 3. ESRD on hemodialysis presently appears euvolemic.  Consult nephrology for dialysis. 4. History of tachybradycardia syndrome and A. fib status post pacemaker placement not on anticoagulation secondary risk of falls.  On amiodarone for rate control. 5. Hyperlipidemia on statins. 6. History of nonischemic cardiomyopathy EF improved from 35 to 55% per 2D echo done in 2019 per cardiology notes. 7. Hypertension on Imdur. 8. Chronic pain on fentanyl.   DVT prophylaxis: Heparin. Code Status: Full code. Family Communication: Discussed with patient. Disposition Plan: Home in stable.  Patient lives with her daughter. Consults called: Physical therapy. Admission status: Observation.   Rise Patience MD [facility] Pager [phone].  If 7PM-7AM, please contact night-coverage [email or web address] Password [email or web address]  09/01/2019, 12:30 AM

## 2019-09-01 NOTE — Progress Notes (Signed)
PROGRESS NOTE    Valerie Terrell  NFA:[id number] DOB: 05/22/1932 DOA: 08/31/2019 PCP: Tammi Sou, MD    Brief Narrative:  Collins Dimaria is a 86 y.o. female with history of ESRD on hemodialysis on Tuesday Thursday Saturdays who was recently admitted for fluid overload and discharged on August 17, 2019 presents to the ER because of abdominal pain.  Patient states she has been having abdominal pain generalized for the last 1 week which has been gradually worsening.  Over the last 2 days patient also had some nausea vomiting.  Denies any blood in the vomitus.  Denies any fall or trauma.  States she does not miss her dialysis.  Denies chest pain or shortness of breath.  No fever chills.  In the ER CT scan of the abdomen pelvis shows diverticulitis of sigmoid colon and also a new T12 compression fracture.  Patient is able to move extremities and denies any incontinence of urine or bowel.  Labs show WBC count of 18 potassium 3.8 creatinine 4.2 LFTs normal except for albumin of 3.1.  EKG shows normal sinus rhythm.  Covid test was negative.  On exam abdomen appears benign.  Patient is able to move all extremities.  Patient was started on ceftriaxone and Flagyl for diverticulitis.   Assessment & Plan:   Principal Problem:   Diverticulitis Active Problems:   PAF (paroxysmal atrial fibrillation) (HCC)   Essential hypertension   History of CVA (cerebrovascular accident)   Cardiac pacemaker   Non-ischemic cardiomyopathy ([place])   ESRD (end stage renal disease) ([place])   T12 compression fracture ([place])   Acute sigmoid diverticulitis Patient presenting with progressive abdominal pain over the past week.  Patient is afebrile with a leukocytosis of 18.0.  CT abdomen/pelvis notable for slight differential wall thickening sigmoid colon with pericolonic fat stranding consistent with acute diverticulitis. --WBC 18.0-->12.9 --Continue antibiotics with ceftriaxone and Flagyl --Diet as tolerated --Supportive  care, antiemetics prn  New T12 compression fracture with intractable pain Hx of chronic pain CT abdomen/pelvis notable for T12 compression fracture which appears new with less than 25% height loss.  Patient with history of chronic pain, now with intractable low back pain. --IR consulted to evaluate for kyphoplasty, plan next week once acute infectious process as above has been controlled --New home fentanyl patch 50 mcg q72h --Continue home oxycodone IR 10-20mg  q6h prn --Dilaudid 0.5 mg IV q3h prn for severe breakthrough pain --If pain continues to be difficult to control, will consult palliative care for assistance given her underlying chronic pain issues as well --Continue PT/OT efforts, currently recommending SNF placement, patient/daughter currently declining but will reconsider if no significant progress over the next few days.  ESRD on HD MTTS Recently admitted and discharged on August 17, 2019 for volume overload.  Now receives hemodialysis 4 days/week.  Dialyzes at [facility] dialysis center.  Missed dialysis on 08/31/2019.  Lateralize currently stable. --Nephrology following, appreciate assistance --Plan to resume HD tomorrow --Continue to monitor electrolytes  History of tachybradycardia syndrome s/p PPM Paroxysmal atrial fibrillation Currently rate controlled --Continue amiodarone 200 mg p.o. daily --Not on chronic anticoagulation secondary to frequent falls --Discontinue telemetry  HLD: Continue pravastatin 40 mg p.o. daily  History of nonischemic cardiomyopathy Essential hypertension TTE 11/2017 with EF 55-60% (improved from 35% prior), grade 2 diastolic dysfunction, mild/moderate MR, LA moderately dilated, mild TR --Continue Imdur 60 mg p.o. daily   DVT prophylaxis: Heparin Code Status: Full code Family Communication: Updated per daughter who is present at bedside  Disposition Plan:  Status is: Observation  The patient will require care spanning > 2 midnights and  should be moved to inpatient because: Ongoing active pain requiring inpatient pain management, Ongoing diagnostic testing needed not appropriate for outpatient work up, Unsafe d/c plan, IV treatments appropriate due to intensity of illness or inability to take PO and Inpatient level of care appropriate due to severity of illness pending T12 kyphoplasty once infection under control.  Dispo: The patient is from: Home              Anticipated d/c is to: Home vs SNF              Anticipated d/c date is: > 3 days              Patient currently is not medically stable to d/c.   Consultants:   IR  Procedures:   T12 kyphoplasty: Pending  Antimicrobials:   Ceftriaxone 4/15>>  Flagyl 4/15>>   Subjective: Patient seen and examined bedside, continues with significant low back pain.  Daughter present.  Daughter requesting evaluation for kyphoplasty as this has helped in the past.  Missed her dialysis session yesterday due to intractable pain, currently cannot sit for her dialysis sessions outpatient.  He is with some nausea, abdominal pain slightly improved.  No other complaints or concerns this time.  Denies headache, no fever/chills/night sweats, no vomiting/diarrhea, no chest pain, no palpitations, no shortness of breath, no weakness.  No acute events overnight per nursing staff.  Objective: Vitals:   08/31/19 2238 09/01/19 0100 09/01/19 0432 09/01/19 0825  BP: (!) 129/47 (!) 151/45 (!) 133/50 (!) 139/49  Pulse: 75 78 73 72  Resp: 19 18 15 17   Temp:  98.4 F (36.9 C) 98.4 F (36.9 C) 98 F (36.7 C)  TempSrc:  Oral Oral Oral  SpO2: 99% 92% 91% 92%    Intake/Output Summary (Last 24 hours) at 09/01/2019 1322 Last data filed at 09/01/2019 5366 Gross per 24 hour  Intake 300.14 ml  Output 0 ml  Net 300.14 ml   There were no vitals filed for this visit.  Examination:  General exam: Appears slightly uncomfortable due to continued low back pain Respiratory system: Clear to  auscultation. Respiratory effort normal.  Oxygenating well on room air Cardiovascular system: S1 & S2 heard, RRR. No JVD, murmurs, rubs, gallops or clicks. No pedal edema. Gastrointestinal system: Abdomen is nondistended, soft and mild left upper/quadrant tenderness, nontender. No organomegaly or masses felt. Normal bowel sounds heard. Central nervous system: Alert and oriented. No focal neurological deficits. Extremities: Symmetric 5 x 5 power. Skin: No rashes, lesions or ulcers Psychiatry: Judgement and insight appear normal. Mood & affect appropriate.     Data Reviewed: I have personally reviewed following labs and imaging studies  CBC: Recent Labs  Lab 08/31/19 1300 09/01/19 0449  WBC 18.0* 12.9*  HGB 13.1 11.0*  HCT 42.7 34.9*  MCV 100.0 97.8  PLT 189 440   Basic Metabolic Panel: Recent Labs  Lab 08/31/19 1300 09/01/19 0449  NA 137 133*  K 3.8 4.1  CL 92* 91*  CO2 28 27  GLUCOSE 122* 88  BUN 16 25*  CREATININE 4.23* 4.98*  CALCIUM 8.9 8.4*   GFR: CrCl cannot be calculated (Unknown ideal weight.). Liver Function Tests: Recent Labs  Lab 08/31/19 1300  AST 24  ALT 15  ALKPHOS 102  BILITOT 0.7  PROT 6.8  ALBUMIN 3.1*   Recent Labs  Lab 08/31/19 1300  LIPASE 27   No  results for input(s): AMMONIA in the last 168 hours. Coagulation Profile: No results for input(s): INR, PROTIME in the last 168 hours. Cardiac Enzymes: No results for input(s): CKTOTAL, CKMB, CKMBINDEX, TROPONINI in the last 168 hours. BNP (last 3 results) No results for input(s): PROBNP in the last 8760 hours. HbA1C: No results for input(s): HGBA1C in the last 72 hours. CBG: No results for input(s): GLUCAP in the last 168 hours. Lipid Profile: No results for input(s): CHOL, HDL, LDLCALC, TRIG, CHOLHDL, LDLDIRECT in the last 72 hours. Thyroid Function Tests: No results for input(s): TSH, T4TOTAL, FREET4, T3FREE, THYROIDAB in the last 72 hours. Anemia Panel: No results for input(s):  VITAMINB12, FOLATE, FERRITIN, TIBC, IRON, RETICCTPCT in the last 72 hours. Sepsis Labs: No results for input(s): PROCALCITON, LATICACIDVEN in the last 168 hours.  Recent Results (from the past 240 hour(s))  SARS CORONAVIRUS 2 (TAT 6-24 HRS) Nasopharyngeal Nasopharyngeal Swab     Status: None   Collection Time: 08/31/19  8:32 PM   Specimen: Nasopharyngeal Swab  Result Value Ref Range Status   SARS Coronavirus 2 NEGATIVE NEGATIVE Final    Comment: (NOTE) SARS-CoV-2 target nucleic acids are NOT DETECTED. The SARS-CoV-2 RNA is generally detectable in upper and lower respiratory specimens during the acute phase of infection. Negative results do not preclude SARS-CoV-2 infection, do not rule out co-infections with other pathogens, and should not be used as the sole basis for treatment or other patient management decisions. Negative results must be combined with clinical observations, patient history, and epidemiological information. The expected result is Negative. Fact Sheet for Patients: SugarRoll.be Fact Sheet for Healthcare Providers: https://www.woods-mathews.com/ This test is not yet approved or cleared by the [place] FDA and  has been authorized for detection and/or diagnosis of SARS-CoV-2 by FDA under an Emergency Use Authorization (EUA). This EUA will remain  in effect (meaning this test can be used) for the duration of the COVID-19 declaration under Section 56 4(b)(1) of the Act, 21 U.S.C. section 360bbb-3(b)(1), unless the authorization is terminated or revoked sooner. Performed at [facility], [place] [address], [place], [place] [address]          Radiology Studies: CT ABDOMEN PELVIS WO CONTRAST  Result Date: 08/31/2019 CLINICAL DATA:  Acute on chronic back pain for several days, emesis, short of breath EXAM: CT ABDOMEN AND PELVIS WITHOUT CONTRAST TECHNIQUE: Multidetector CT imaging of the abdomen and pelvis was  performed following the standard protocol without IV contrast. COMPARISON:  12/13/2018 FINDINGS: Lower chest: Hypoventilatory changes are seen at the lung bases. There is chronic scarring at the left lung base. Diffuse atherosclerosis of the aorta and coronary vessels again noted. Extensive calcification of the aortic and mitral valves. Pacemaker unchanged. Hepatobiliary: Gallbladder sludge is identified without cholecystitis. The liver is unremarkable. Pancreas: Unremarkable. No pancreatic ductal dilatation or surrounding inflammatory changes. Spleen: Normal in size without focal abnormality. Adrenals/Urinary Tract: Progressive bilateral renal cortical atrophy consistent with end-stage renal disease. Stable cysts right kidney. No urinary tract calculi or obstruction. Bladder is unremarkable. The adrenals are unremarkable. Stomach/Bowel: There is circumferential wall thickening of the mid sigmoid colon, reference image 64. Mild pericolonic fat stranding consistent with acute colitis or diverticulitis. No bowel obstruction or ileus. The appendix is surgically absent. Vascular/Lymphatic: There is severe atherosclerosis throughout the aorta and its branches. No pathologic adenopathy. Reproductive: Uterus is markedly atrophic.  No adnexal masses. Other: No free fluid or free gas. Diffuse atrophy of the abdominal wall musculature unchanged. Musculoskeletal: Chronic T10 compression  deformity with previous vertebral augmentation. There is an age-indeterminate T12 compression fracture, new since prior CT. Less than 25% loss of height. No significant paraspinal hematoma. No additional fractures. Postsurgical changes right hip. Reconstructed images demonstrate no additional findings. IMPRESSION: 1. Circumferential wall thickening of the mid sigmoid colon with mild pericolonic fat stranding, consistent with acute uncomplicated diverticulitis or colitis. 2. New T12 compression deformity, with less than 25% loss of height. Based  on appearance, this could reflect an acute or subacute fracture. 3. Sequela of end-stage renal disease. 4. Aortic Atherosclerosis (ICD10-I70.0). Severe coronary artery atherosclerosis. Electronically Signed   By: Randa Ngo M.D.   On: 08/31/2019 19:37   DG Chest Port 1 View  Result Date: 08/31/2019 CLINICAL DATA:  Shortness of breath EXAM: PORTABLE CHEST 1 VIEW COMPARISON:  August 16, 2019 and April 13, 2019 FINDINGS: There is suspected scarring in the left base. There may also be epicardial fat prominence in this area. The appearance is stable. There is no apparent edema or airspace opacity. Heart is upper normal in size with pulmonary vascularity normal. Pacemaker lead tips are attached to the right atrium and right ventricle. No adenopathy. There is aortic atherosclerosis. Bones are osteoporotic. Patient is undergone prior kyphoplasty procedure in the lower thoracic region IMPRESSION: Suspect a degree of scarring in the lateral left base. No edema or airspace opacity. Stable cardiac silhouette. Pacemaker leads attached to right atrium and right ventricle. Bones osteoporotic. Aortic Atherosclerosis (ICD10-I70.0). Electronically Signed   By: Lowella Grip III M.D.   On: 08/31/2019 15:29        Scheduled Meds: . amiodarone  200 mg Oral Daily  . aspirin EC  325 mg Oral QPM  . calcitonin (salmon)  1 spray Alternating Nares Daily  . Chlorhexidine Gluconate Cloth  6 each Topical Daily  . [START ON 09/02/2019] doxercalciferol  3 mcg Intravenous Q T,Th,Sa-HD  . [START ON 09/02/2019] fentaNYL  1 patch Transdermal Q72H  . heparin  5,000 Units Subcutaneous Q8H  . isosorbide mononitrate  60 mg Oral Daily  . pravastatin  40 mg Oral QHS  . sevelamer carbonate  1,600 mg Oral TID WC  . sodium chloride flush  3 mL Intravenous Once   Continuous Infusions: . cefTRIAXone (ROCEPHIN)  IV    . metronidazole 500 mg (09/01/19 0505)     LOS: 0 days    Time spent: 42 minutes spent on chart review,  discussion with nursing staff, consultants, updating family and interview/physical exam; more than 50% of that time was spent in counseling and/or coordination of care.    Lilyth Lawyer J [place] DO [facility] Available via Epic secure chat 7am-7pm After these hours, please refer to coverage provider listed on [email or web address] 09/01/2019, 1:22 PM

## 2019-09-01 NOTE — Consult Note (Signed)
Chief Complaint: Patient was seen in consultation today for T12 compression fracture/vertebral body augmentation.  Referring Physician(s): Enzo Montgomery  Supervising Physician: Pedro Earls  Patient Status: [facility] - In-pt  History of Present Illness: Valerie Terrell is a 86 y.o. female with a past medical history of hypertension, hyperlipidemia, sick sinus syndrome, HF, paroxysmal atrial fibrillation not on chronic anticoagulation, [place] 2016, CVA 2017, ESRD on HD, nephrolithiasis, osteopenia, and multiple fractures (thoracic compression fractures, right hip fracture, metatarsal fracture). She presented to [facility] ED 09/01/2019 with complaints of back and abdominal pain. In ED, CT abdomen/pelvis revealed diverticulitis and a new T12 compression fracture. She was admitted for further management.  CT abdomen/pelvis 08/31/2019: 1. Circumferential wall thickening of the mid sigmoid colon with mild pericolonic fat stranding, consistent with acute uncomplicated diverticulitis or colitis. 2. New T12 compression deformity, with less than 25% loss of height. Based on appearance, this could reflect an acute or subacute fracture. 3. Sequela of end-stage renal disease. 4. Aortic Atherosclerosis (ICD10-I70.0). Severe coronary artery atherosclerosis.  IR consulted by Dr. [place] for possible image-guided T12 kyphoplasty/vertebroplasty. Patient awake and alert sitting in bed. Complains of mid back pain, rated 10-11/10 at this time. States pain started insidiously last week- denies injury or fall that could be related to fracture. States that any movement worsens pain. Denies fever, chills, chest pain, dyspnea, abdominal pain, or headache.   Past Medical History:  Diagnosis Date  . Anemia of chronic disease 2018   + anemia of CRI?  [name] Atrophy of left kidney    with absent blood flow by renal artery dopplers (Dr. Gwenlyn Found)  . 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 Chronic combined  systolic and diastolic CHF (congestive heart failure) ([place])    a. 11/2016: echo showing EF of 35-40%, RV strain noted, mild MR and mild TR.   Echo showed much improved EF 12/2017 (55-60%)  . Chronic respiratory failure with hypoxia ([place]) 02/11/2017   Now on chronic O2 (intermittent as of summer 2019).    . Closed fracture of right superior pubic ramus ([place]) 07/18/2018   Non op mgmt (Dr. Victorino December was ortho who saw her in hosp but i don't think pt followed up with him).  . Closed right hip fracture ([place]) 10/04/2018   ORIF  . Debilitated patient    WC dependent as of 2018.  Hosp bed + full assistance with most ADLs s/p hip fx surg 09/2018.  [name] ESRD on hemodialysis ([place]) 01/2017   T/Th/Sat schedule- [place].  Right basilic AV fistula 76/2831.  Graft 2019.  [name] History of adenomatous polyp of colon   . History of kidney stones   . History of subarachnoid hemorrhage 10/2014   after syncope and while on xarelto  . Hyperlipidemia   . Hypertension    Difficult to control, in the setting of one functioning kidney: pt was referred to nephrology by Dr. Gwenlyn Found 06/2015.  . Lumbar radiculopathy 2012  . Lumbar spinal stenosis 2019   facet injections only very short term relief.  L1 and L2 selective nerve root blocks helpful 04/2018.  . Lumbar spondylosis    MR 07/2016---no sign of spinal nerve compression or cord compression.  Pt set up with outpt ortho while admitted to hosp 07/2016.  . Malnourished ([place])   . Metatarsal fracture 06/10/2016   Nondisplaced, left 5th metatarsal--pt was referred to ortho  . Osteopenia 2014   T-score -2.1  . PAF (paroxysmal atrial fibrillation) ([place])  Eliquis started after BRAO and CVA.  She was changed to warfarin 2018. All anticoag stopped due to recurrent falls.  . Presence of permanent cardiac pacemaker   . Right rib fracture 12/2016   s/p fall  . Sick sinus syndrome ([place]) 11/2016   Dual chamber pacer insertion 2018 (Dr. Lovena Le)  . Stroke ([place]) 18/5631    cardioembolic (had CVA while on no anticoag)--"scattered subacute punctate infarcts: 1 in R parietal lobe and 2 in occipital cortex" on MRI br.  CT angio head/neck: aortic arch athero.  R ICA 20% stenosis, L ICA w/out any stenosis.  . Thoracic back pain 01/2017; 01/2018   2018: Facet?  Dr. Ramos->steroid injection.  01/2018-->CT T spine to check for a new comp fx-->none found.  Selective nerve root inj in T spine helpful on R side.   . Thoracic compression fracture ([place]) 07/2016   T3.  T7 and T8-- T8 kyphoplasty during hosp admission 07/2016.  Neuro referred pt to pain mgmt for consideration of injection 09/2016.  I referred her to endo 08/2016 for consideration of calcitonin treatment.    Past Surgical History:  Procedure Laterality Date  . APPENDECTOMY  child  . AV FISTULA PLACEMENT Right 03/31/2017   Procedure: Right ARM Basilic vein transposition;  Surgeon: Conrad [place], MD;  [facility];  Service: Vascular;  Laterality: Right;  . AV FISTULA PLACEMENT Right 12/06/2017   Procedure: INSERTION OF ARTERIOVENOUS (AV) GORE-TEX GRAFT ARM RIGHT UPPER EXTREMITY;  Surgeon: [name], MD;  Location: [place];  Service: Vascular;  Laterality: Right;  . CARDIOVASCULAR STRESS TEST  01/31/2016   Stress myoview: NORMAL/Low risk.  EF 56%.  . [place]  . COLONOSCOPY  2015   + hx of adenomatous polyps.  Need digest health spec in [place] records to see when pt due for next colonoscopy  . DECLOT CKV Right 03/21/2019  . EYE SURGERY Bilateral    Catarct  . INSERTION OF DIALYSIS CATHETER Right 01/29/2017   Procedure: INSERTION OF DIALYSIS CATHETER- RIGHT INTERNAL JUGULAR;  Surgeon: [name], MD;  Location: [place];  Service: Vascular;  Laterality: Right;  . INTRAMEDULLARY (IM) NAIL INTERTROCHANTERIC Right 10/04/2018   Procedure: INTRAMEDULLARY (IM) NAIL INTERTROCHANTRIC;  Surgeon: Nicholes Stairs, MD;  Location: [place];  Service: Orthopedics;  Laterality: Right;  . IR GENERIC HISTORICAL   07/24/2016   IR KYPHO THORACIC WITH BONE BIOPSY 07/24/2016 Luanne Bras, MD [facility]-INTERV RAD  . KYPHOPLASTY  07/27/2016   T8  . PACEMAKER IMPLANT N/A 12/03/2016   Procedure: Pacemaker Implant;  Surgeon: Evans Lance, MD;  Location: [place] CV LAB;  Service: Cardiovascular;  Laterality: N/A;  . Renal artery dopplers  02/26/2016   Her right renal dimension was 11 cm pole to pole with mild to moderate right renal artery stenosis. A right renal aortic ratio was 3.22 suggesting less than a 50% stenosis.  . Right upper arm AV Gore-Tex graft  12/06/2017   Dr. Donnetta Hutching  . TONSILLECTOMY    . TRANSTHORACIC ECHOCARDIOGRAM  01/29/2016; 11/2016; 12/2017   2017: EF 55-60%, normal LV wall motion, grade I DD.  No cardiac source of emboli was seen.  2018: EF 35-40%, could not assess DD due to a-fib, pulm HTN noted. 12/2017 EF 55-60%, nl wall motion, grd II DD, mod MR and pulm regurg, increased pulm pressure (peak 52).    Allergies: Clonidine derivatives, Adhesive [tape], Codeine, Nickel, and Sulfa antibiotics  Medications: Prior to Admission medications   Medication Sig  Start Date End Date Taking? Authorizing Provider  acetaminophen (TYLENOL) 500 MG tablet Take 500-1,000 mg 3 (three) times daily as needed by mouth for moderate pain.    Yes [provider]  amiodarone (PACERONE) 200 MG tablet TAKE 1 TABLET BY MOUTH  DAILY Patient taking differently: Take 200 mg by mouth daily.  03/27/19  Yes Lorretta Harp, MD  Artificial Saliva (ACT DRY MOUTH) LOZG Use as directed 1 lozenge in the mouth or throat daily as needed (for dry mouth).   Yes [provider]  aspirin EC 325 MG tablet Take 325 mg by mouth every evening.   Yes [provider]  b complex-vitamin c-folic acid (NEPHRO-VITE) 0.8 MG TABS tablet Take 1 tablet by mouth See admin instructions. Take one tablet by mouth on Tuesday, Thursday, Saturday - after dialysis 09/07/18  Yes [provider]  calcitonin, salmon,  (MIACALCIN/FORTICAL) 200 UNIT/ACT nasal spray Place 1 spray into alternate nostrils daily.  07/06/19  Yes [provider]  D-5000 125 MCG (5000 UT) TABS Take 1 tablet by mouth daily. 08/18/19  Yes [provider]  DENTA 5000 PLUS 1.1 % CREA dental cream Place 1 application onto teeth at bedtime.  04/06/19  Yes [provider]  docusate sodium (COLACE) 100 MG capsule Take 100 mg by mouth daily as needed for mild constipation.    Yes [provider]  doxercalciferol (HECTOROL) 2.5 MCG capsule Take 2.5 mcg by mouth 3 (three) times a week.  02/28/19 02/27/20 Yes [provider]  fentaNYL (DURAGESIC) 50 MCG/HR Place 1 patch onto the skin every 3 (three) days. 08/21/19  Yes McGowen, Adrian Blackwater, MD  heparin 1000 unit/mL SOLN injection 1,000 Units by Dialysis route one time in dialysis.  02/07/19 02/06/20 Yes [provider]  isosorbide mononitrate (IMDUR) 60 MG 24 hr tablet TAKE 1 TABLET BY MOUTH  DAILY ; TAKE 1 TABLET ON  SUN, MON, WED, FRI MORNINGS AND 1 TABLET ON TUES, THU,  SAT AFTER DIALYSIS Patient taking differently: Take 60 mg by mouth daily.  07/21/19  Yes McGowen, Adrian Blackwater, MD  Lidocaine 4 % PTCH Apply 1 patch topically daily as needed (Mild pain).   Yes [provider]  lidocaine-prilocaine (EMLA) cream Apply 1 application topically See admin instructions. Apply small amount to access site (AVF) one hour before dialysis, cover with occlusive dressing (saran wrap) 11/11/18  Yes [provider]  Multiple Vitamins-Minerals (MULTIVITAMIN WITH MINERALS) tablet Take 1 tablet by mouth at bedtime.    Yes [provider]  ondansetron (ZOFRAN) 4 MG tablet Take 1 tablet (4 mg total) by mouth every 8 (eight) hours as needed for nausea or vomiting. 07/12/19  Yes McGowen, Adrian Blackwater, MD  Oxycodone HCl 10 MG TABS 1-2 tabs po q6h prn severe pain Patient taking differently: Take 20 mg by mouth every 6 (six) hours as needed (for pain).  08/21/19  Yes  McGowen, Adrian Blackwater, MD  polyvinyl alcohol (LIQUIFILM TEARS) 1.4 % ophthalmic solution Place 1-2 drops into both eyes daily as needed for dry eyes.    Yes [provider]  pravastatin (PRAVACHOL) 40 MG tablet TAKE 1 TABLET BY MOUTH  EVERY EVENING Patient taking differently: Take 40 mg by mouth at bedtime.  03/09/18  Yes McGowen, Adrian Blackwater, MD  sevelamer carbonate (RENVELA) 800 MG tablet Take 1,600 mg by mouth 3 (three) times daily with meals.    Yes [provider]  vitamin C (ASCORBIC ACID) 500 MG tablet Take 500 mg by  mouth daily.   Yes [provider]  Vitamin D, Ergocalciferol, (DRISDOL) 1.25 MG (50000 UT) CAPS capsule Take 1 capsule (50,000 Units total) by mouth every Thursday. 04/28/18  Yes McGowen, Adrian Blackwater, MD  naloxone [facility]) 0.4 MG/ML injection Inject IM as needed for reversal of opioid pain medication Patient not taking: Reported on 08/31/2019 07/20/19   Tammi Sou, MD     Family History  Problem Relation Age of Onset  . Cancer Mother   . Heart disease Father   . Early death Father   . Sudden Cardiac Death Neg Hx     Social History   Socioeconomic History  . Marital status: Widowed    Spouse name: Not on file  . Number of children: Not on file  . Years of education: Not on file  . Highest education level: Not on file  Occupational History  . Not on file  Tobacco Use  . Smoking status: Former Smoker    Years: 22.00    Quit date: 03/17/1972    Years since quitting: 47.4  . Smokeless tobacco: Never Used  Substance and Sexual Activity  . Alcohol use: Yes    Alcohol/week: 1.0 standard drinks    Types: 1 Glasses of wine per week    Comment: wine  . Drug use: No  . Sexual activity: Not Currently  Other Topics Concern  . Not on file  Social History Narrative   Widow.  One daughter, lives with her.   Educ: college   Occup: retired Marine scientist.   No T/A/Ds.   She is almost a vegetarian.   Social Determinants of Health   Financial Resource  Strain:   . Difficulty of Paying Living Expenses:   Food Insecurity:   . Worried About Charity fundraiser in the Last Year:   . Arboriculturist in the Last Year:   Transportation Needs:   . Film/video editor (Medical):   [name] Lack of Transportation (Non-Medical):   Physical Activity:   . Days of Exercise per Week:   . Minutes of Exercise per Session:   Stress:   . Feeling of Stress :   Social Connections:   . Frequency of Communication with Friends and Family:   . Frequency of Social Gatherings with Friends and Family:   . Attends Religious Services:   . Active Member of Clubs or Organizations:   . Attends Archivist Meetings:   [name] Marital Status:      Review of Systems: A 12 point ROS discussed and pertinent positives are indicated in the HPI above.  All other systems are negative.  Review of Systems  Constitutional: Negative for chills and fever.  Respiratory: Negative for shortness of breath and wheezing.   Cardiovascular: Negative for chest pain and palpitations.  Gastrointestinal: Negative for abdominal pain.  Musculoskeletal: Positive for back pain.  Neurological: Negative for headaches.  Psychiatric/Behavioral: Negative for behavioral problems and confusion.    Vital Signs: BP (!) 139/49 (BP Location: Left Arm)   Pulse 72   Temp 98 F (36.7 C) (Oral)   Resp 17   LMP  (LMP Unknown)   SpO2 92%   Physical Exam Vitals and nursing note reviewed.  Constitutional:      General: She is not in acute distress.    Appearance: Normal appearance.  Cardiovascular:     Rate and Rhythm: Normal rate and regular rhythm.     Heart sounds: Normal heart sounds. No murmur.  Pulmonary:  Effort: Pulmonary effort is normal. No respiratory distress.     Breath sounds: Normal breath sounds. No wheezing.  Musculoskeletal:     Comments: Moderate midline tenderness of mid back at approximate level of T12.  Skin:    General: Skin is warm and dry.  Neurological:      Mental Status: She is alert and oriented to person, place, and time.      MD Evaluation Airway: WNL Heart: WNL Abdomen: WNL Chest/ Lungs: WNL ASA  Classification: 3 Mallampati/Airway Score: One   Imaging: CT ABDOMEN PELVIS WO CONTRAST  Result Date: 08/31/2019 CLINICAL DATA:  Acute on chronic back pain for several days, emesis, short of breath EXAM: CT ABDOMEN AND PELVIS WITHOUT CONTRAST TECHNIQUE: Multidetector CT imaging of the abdomen and pelvis was performed following the standard protocol without IV contrast. COMPARISON:  12/13/2018 FINDINGS: Lower chest: Hypoventilatory changes are seen at the lung bases. There is chronic scarring at the left lung base. Diffuse atherosclerosis of the aorta and coronary vessels again noted. Extensive calcification of the aortic and mitral valves. Pacemaker unchanged. Hepatobiliary: Gallbladder sludge is identified without cholecystitis. The liver is unremarkable. Pancreas: Unremarkable. No pancreatic ductal dilatation or surrounding inflammatory changes. Spleen: Normal in size without focal abnormality. Adrenals/Urinary Tract: Progressive bilateral renal cortical atrophy consistent with end-stage renal disease. Stable cysts right kidney. No urinary tract calculi or obstruction. Bladder is unremarkable. The adrenals are unremarkable. Stomach/Bowel: There is circumferential wall thickening of the mid sigmoid colon, reference image 64. Mild pericolonic fat stranding consistent with acute colitis or diverticulitis. No bowel obstruction or ileus. The appendix is surgically absent. Vascular/Lymphatic: There is severe atherosclerosis throughout the aorta and its branches. No pathologic adenopathy. Reproductive: Uterus is markedly atrophic.  No adnexal masses. Other: No free fluid or free gas. Diffuse atrophy of the abdominal wall musculature unchanged. Musculoskeletal: Chronic T10 compression deformity with previous vertebral augmentation. There is an age-indeterminate  T12 compression fracture, new since prior CT. Less than 25% loss of height. No significant paraspinal hematoma. No additional fractures. Postsurgical changes right hip. Reconstructed images demonstrate no additional findings. IMPRESSION: 1. Circumferential wall thickening of the mid sigmoid colon with mild pericolonic fat stranding, consistent with acute uncomplicated diverticulitis or colitis. 2. New T12 compression deformity, with less than 25% loss of height. Based on appearance, this could reflect an acute or subacute fracture. 3. Sequela of end-stage renal disease. 4. Aortic Atherosclerosis (ICD10-I70.0). Severe coronary artery atherosclerosis. Electronically Signed   By: Randa Ngo M.D.   On: 08/31/2019 19:37   DG Chest 2 View  Result Date: 08/16/2019 CLINICAL DATA:  Shortness of breath EXAM: CHEST - 2 VIEW COMPARISON:  08/11/2019 FINDINGS: The heart is enlarged but stable. Stable tortuosity and calcification of the thoracic aorta. The pacer wires are stable. Low lung volumes with vascular crowding and streaky basilar atelectasis. No focal infiltrates or pulmonary edema. Stable scoliosis and evidence of prior thoracic vertebral augmentation. IMPRESSION: Low lung volumes with vascular crowding and streaky basilar atelectasis. No infiltrates or edema. Electronically Signed   By: Marijo Sanes M.D.   On: 08/16/2019 05:37   DG Chest 2 View  Result Date: 08/11/2019 CLINICAL DATA:  Shortness of breath EXAM: CHEST - 2 VIEW COMPARISON:  03/24/2019 FINDINGS: Cardiac shadow is enlarged but stable. Aortic calcifications are seen. Pacing device is again noted and stable. Changes of prior vertebral augmentation at T8 are seen. The lungs are clear. No acute bony abnormality is noted. IMPRESSION: No acute abnormality noted. Electronically Signed   By:  Inez Catalina M.D.   On: 08/11/2019 09:25   DG Chest Port 1 View  Result Date: 08/31/2019 CLINICAL DATA:  Shortness of breath EXAM: PORTABLE CHEST 1 VIEW  COMPARISON:  August 16, 2019 and April 13, 2019 FINDINGS: There is suspected scarring in the left base. There may also be epicardial fat prominence in this area. The appearance is stable. There is no apparent edema or airspace opacity. Heart is upper normal in size with pulmonary vascularity normal. Pacemaker lead tips are attached to the right atrium and right ventricle. No adenopathy. There is aortic atherosclerosis. Bones are osteoporotic. Patient is undergone prior kyphoplasty procedure in the lower thoracic region IMPRESSION: Suspect a degree of scarring in the lateral left base. No edema or airspace opacity. Stable cardiac silhouette. Pacemaker leads attached to right atrium and right ventricle. Bones osteoporotic. Aortic Atherosclerosis (ICD10-I70.0). Electronically Signed   By: Lowella Grip III M.D.   On: 08/31/2019 15:29    Labs:  CBC: Recent Labs    08/16/19 0447 08/17/19 0217 08/31/19 1300 09/01/19 0449  WBC 6.6 6.5 18.0* 12.9*  HGB 12.4 10.7* 13.1 11.0*  HCT 40.3 34.5* 42.7 34.9*  PLT 227 205 189 154    COAGS: Recent Labs    10/04/18 1324 07/18/19 0000 08/22/19 0000  INR 1.0 0.9 1.0    BMP: Recent Labs    08/16/19 0447 08/17/19 0217 08/31/19 1300 09/01/19 0449  NA 133* 140 137 133*  K 4.5 4.0 3.8 4.1  CL 92* 99 92* 91*  CO2 29 32 28 27  GLUCOSE 82 79 122* 88  BUN 9 <5* 16 25*  CALCIUM 8.0* 8.1* 8.9 8.4*  CREATININE 2.96* 1.71* 4.23* 4.98*  GFRNONAA 14* 27* 9* 7*  GFRAA 16* 31* 10* 9*    LIVER FUNCTION TESTS: Recent Labs    03/25/19 1456 08/11/19 0929 08/16/19 0447 08/31/19 1300  BILITOT  --  0.6 0.3 0.7  AST  --  23 24 24   ALT  --  15 12 15   ALKPHOS  --  87 88 102  PROT  --  5.8* 6.6 6.8  ALBUMIN 2.2* 2.3* 2.5* 3.1*     Assessment and Plan:  T12 compression fracture, seen on CT abdomen/pelvis 08/31/2019. Plan for image-guided T12 kyphoplasty/vertebroplasty pending insurance approval and improvement in diverticulitis, in IR with Dr. Karenann Cai next week. Case has been reviewed by Dr. Karenann Cai who approves procedure. PA to evaluate patient Monday- will work on scheduling patient for procedure next week if improvement in diverticulitis (ensure infection is resolving) and insurance approved at that time. Once procedure is scheduled, will place appropriate pre-procedure orders (NPO, INR). If patient is to be discharged over the weekend, procedure can occur on an outpatient basis (will require outpatient order by [facility]). Dr. [place] aware of above.  Risks and benefits of T12 kyphoplasty/vertebroplasty were discussed with the patient including, but not limited to education regarding the natural healing process of compression fractures without intervention, bleeding, infection, cement migration which may cause spinal cord damage, paralysis, pulmonary embolism or even death. This interventional procedure involves the use of X-rays and because of the nature of the planned procedure, it is possible that we will have prolonged use of X-ray fluoroscopy. Potential radiation risks to you include (but are not limited to) the following: - A slightly elevated risk for cancer  several years later in life. This risk is typically less than 0.5% percent. This risk is low in comparison to the normal incidence  of human cancer, which is 33% for women and 50% for men according to the [place]. - Radiation induced injury can include skin redness, resembling a rash, tissue breakdown / ulcers and hair loss (which can be temporary or permanent).  The likelihood of either of these occurring depends on the difficulty of the procedure and whether you are sensitive to radiation due to previous procedures, disease, or genetic conditions.  IF your procedure requires a prolonged use of radiation, you will be notified and given written instructions for further action.  It is your responsibility to monitor the irradiated area for the 2 weeks  following the procedure and to notify your physician if you are concerned that you have suffered a radiation induced injury.   All of the patient's questions were answered, patient is agreeable to proceed. Consent signed and in IR control room.   Thank you for this interesting consult.  I greatly enjoyed meeting Oneta Sigman and look forward to participating in their care.  A copy of this report was sent to the requesting provider on this date.  Electronically Signed: Earley Abide, PA-C 09/01/2019, 12:06 PM   I spent a total of 40 Minutes in face to face in clinical consultation, greater than 50% of which was counseling/coordinating care for T12 compression fracture/vertebral body augmentation.

## 2019-09-01 NOTE — Evaluation (Signed)
Physical Therapy Evaluation Patient Details Name: Valerie Terrell MRN: [id number] DOB: Sep 23, 1932 Today's Date: 09/01/2019   History of Present Illness   Valerie Terrell is a 86 y.o. female with history of ESRD on hemodialysis on Tuesday Thursday Saturdays who was recently admitted for fluid overload and discharged on August 17, 2019 presents to the ER because of abdominal pain. CT showed diverticulitis of sigmoid colon and new T12 compression fracture. Started on ceftriaxone and Flagyl for diverticulitis.   Clinical Impression  Prior to admission, pt lives with her daughter and is ambulatory with a Rollator. Has history of frequent falls. On PT evaluation, pt presents with decreased functional mobility secondary to significant thoracic/left flank pain, debility, balance impairments, and postural abnormalities. Requiring moderate assist to stand from edge of bed and unable to take any steps. Deferred transfer to chair. Currently recommending SNF, but pt states she wants to go home. Will continue to follow to progress mobility as tolerated.     Follow Up Recommendations SNF;Supervision/Assistance - 24 hour (will need HHPT/OT/aide if refuses)    Equipment Recommendations  Wheelchair (measurements PT);Wheelchair cushion (measurements PT)    Recommendations for Other Services       Precautions / Restrictions Precautions Precautions: Fall Restrictions Weight Bearing Restrictions: No      Mobility  Bed Mobility Overal bed mobility: Needs Assistance Bed Mobility: Rolling;Sidelying to Sit Rolling: Min assist Sidelying to sit: Mod assist       General bed mobility comments: Cues for rolling onto left side and reaching for rail. MinA to facilitate hips into sidelying with use of bed pad. modA at trunk to power up to sitting.  Transfers Overall transfer level: Needs assistance Equipment used: Rolling walker (2 wheeled) Transfers: Sit to/from Stand Sit to Stand: Mod assist          General transfer comment: ModA to rise from edge of bed, increased trunk flexion. Able to "shimmy," towards head of bed, unable to pick feet up. Manual assist provided for moving walker.  Ambulation/Gait                Stairs            Wheelchair Mobility    Modified Rankin (Stroke Patients Only)       Balance Overall balance assessment: Needs assistance Sitting-balance support: Feet supported Sitting balance-Leahy Scale: Fair     Standing balance support: Bilateral upper extremity supported Standing balance-Leahy Scale: Poor                               Pertinent Vitals/Pain Pain Assessment: 0-10 Pain Score: 10-Worst pain ever Pain Location: thoracic, L flank Pain Descriptors / Indicators: Grimacing;Guarding;Moaning Pain Intervention(s): Limited activity within patient's tolerance;Monitored during session;Repositioned;Other (comment)(MD notified)    Home Living Family/patient expects to be discharged to:: Private residence Living Arrangements: Children Available Help at Discharge: Family;Available PRN/intermittently Type of Home: House Home Access: Ramped entrance     Home Layout: One level Home Equipment: Bedside commode;Walker - 2 wheels;Cane - single point;Walker - 4 wheels      Prior Function Level of Independence: Needs assistance   Gait / Transfers Assistance Needed: uses Rollator  ADL's / Homemaking Assistance Needed: requires assist for IADL's  Comments: Daughter transports to dialysis     Hand Dominance   Dominant Hand: Right    Extremity/Trunk Assessment   Upper Extremity Assessment Upper Extremity Assessment: Generalized weakness    Lower Extremity Assessment Lower Extremity  Assessment: Generalized weakness    Cervical / Trunk Assessment Cervical / Trunk Assessment: Kyphotic  Communication   Communication: HOH  Cognition Arousal/Alertness: Awake/alert Behavior During Therapy: WFL for tasks  assessed/performed Overall Cognitive Status: Impaired/Different from baseline Area of Impairment: Orientation;Following commands;Safety/judgement;Problem solving                 Orientation Level: Time     Following Commands: Follows multi-step commands inconsistently Safety/Judgement: Decreased awareness of deficits   Problem Solving: Slow processing;Decreased initiation;Difficulty sequencing;Requires verbal cues;Requires tactile cues General Comments: Pt stating month was March. States she just wants to be "immobile;" discussed the importance of mobility and provided pain management education.      General Comments      Exercises     Assessment/Plan    PT Assessment Patient needs continued PT services  PT Problem List Decreased strength;Decreased activity tolerance;Decreased balance;Decreased mobility;Decreased cognition;Decreased safety awareness;Pain       PT Treatment Interventions DME instruction;Gait training;Functional mobility training;Therapeutic activities;Therapeutic exercise;Balance training;Patient/family education;Wheelchair mobility training    PT Goals (Current goals can be found in the Care Plan section)  Acute Rehab PT Goals Patient Stated Goal: less pain PT Goal Formulation: With patient Time For Goal Achievement: 09/15/19 Potential to Achieve Goals: Fair    Frequency Min 3X/week   Barriers to discharge        Co-evaluation               AM-PAC PT "6 Clicks" Mobility  Outcome Measure Help needed turning from your back to your side while in a flat bed without using bedrails?: A Little Help needed moving from lying on your back to sitting on the side of a flat bed without using bedrails?: A Lot Help needed moving to and from a bed to a chair (including a wheelchair)?: A Lot Help needed standing up from a chair using your arms (e.g., wheelchair or bedside chair)?: A Lot Help needed to walk in hospital room?: Total Help needed climbing  3-5 steps with a railing? : Total 6 Click Score: 11    End of Session Equipment Utilized During Treatment: Gait belt Activity Tolerance: Patient limited by pain Patient left: in bed;with call bell/phone within reach;with bed alarm set Nurse Communication: Mobility status PT Visit Diagnosis: Unsteadiness on feet (R26.81);Other abnormalities of gait and mobility (R26.89);Pain;Difficulty in walking, not elsewhere classified (R26.2) Pain - part of body: (back, L flank)    Time: 7579-7282 PT Time Calculation (min) (ACUTE ONLY): 19 min   Charges:   PT Evaluation $PT Eval Moderate Complexity: 1 Mod            Wyona Almas, PT, DPT Acute Rehabilitation Services Pager [phone] Office [phone]   Deno Etienne 09/01/2019, 8:49 AM

## 2019-09-02 DIAGNOSIS — Z7189 Other specified counseling: Secondary | ICD-10-CM

## 2019-09-02 DIAGNOSIS — R52 Pain, unspecified: Secondary | ICD-10-CM

## 2019-09-02 DIAGNOSIS — Z515 Encounter for palliative care: Secondary | ICD-10-CM

## 2019-09-02 DIAGNOSIS — Z66 Do not resuscitate: Secondary | ICD-10-CM

## 2019-09-02 LAB — CBC
HCT: 35.6 % — ABNORMAL LOW (ref 36.0–46.0)
Hemoglobin: 11.4 g/dL — ABNORMAL LOW (ref 12.0–15.0)
MCH: 31.1 pg (ref 26.0–34.0)
MCHC: 32 g/dL (ref 30.0–36.0)
MCV: 97 fL (ref 80.0–100.0)
Platelets: 186 10*3/uL (ref 150–400)
RBC: 3.67 MIL/uL — ABNORMAL LOW (ref 3.87–5.11)
RDW: 15.7 % — ABNORMAL HIGH (ref 11.5–15.5)
WBC: 12.6 10*3/uL — ABNORMAL HIGH (ref 4.0–10.5)
nRBC: 0 % (ref 0.0–0.2)

## 2019-09-02 LAB — RENAL FUNCTION PANEL
Albumin: 2.4 g/dL — ABNORMAL LOW (ref 3.5–5.0)
Anion gap: 17 — ABNORMAL HIGH (ref 5–15)
BUN: 34 mg/dL — ABNORMAL HIGH (ref 8–23)
CO2: 24 mmol/L (ref 22–32)
Calcium: 8.1 mg/dL — ABNORMAL LOW (ref 8.9–10.3)
Chloride: 92 mmol/L — ABNORMAL LOW (ref 98–111)
Creatinine, Ser: 6.34 mg/dL — ABNORMAL HIGH (ref 0.44–1.00)
GFR calc Af Amer: 6 mL/min — ABNORMAL LOW (ref >60)
GFR calc non Af Amer: 6 mL/min — ABNORMAL LOW (ref >60)
Glucose, Bld: 90 mg/dL (ref 70–99)
Phosphorus: 7.7 mg/dL — ABNORMAL HIGH (ref 2.5–4.6)
Potassium: 4.5 mmol/L (ref 3.5–5.1)
Sodium: 133 mmol/L — ABNORMAL LOW (ref 135–145)

## 2019-09-02 LAB — MAGNESIUM: Magnesium: 2.2 mg/dL (ref 1.7–2.4)

## 2019-09-02 MED ORDER — HALOPERIDOL LACTATE 5 MG/ML IJ SOLN: 2.0000 mg | Freq: Four times a day (QID) | INTRAMUSCULAR | Status: DC | PRN

## 2019-09-02 MED ORDER — MELATONIN 3 MG PO TABS
3.0000 mg | ORAL_TABLET | Freq: Every day | ORAL | Status: DC
  Administered 2019-09-03 – 2019-09-04 (×2): 3 mg via ORAL
  Filled 2019-09-02 (×3): qty 1

## 2019-09-02 MED ORDER — HYDROCORTISONE 0.5 % EX CREA
TOPICAL_CREAM | Freq: Four times a day (QID) | CUTANEOUS | Status: DC
  Filled 2019-09-02: qty 28.35

## 2019-09-02 MED ORDER — QUETIAPINE FUMARATE 25 MG PO TABS: 12.5000 mg | ORAL_TABLET | Freq: Every day | ORAL | Status: DC

## 2019-09-02 MED ORDER — SENNOSIDES-DOCUSATE SODIUM 8.6-50 MG PO TABS
2.0000 | ORAL_TABLET | Freq: Two times a day (BID) | ORAL | Status: DC
  Administered 2019-09-02 – 2019-09-06 (×7): 2 via ORAL
  Filled 2019-09-02 (×12): qty 2

## 2019-09-02 MED ORDER — BISACODYL 10 MG RE SUPP: 10.0000 mg | Freq: Once | RECTAL | Status: DC

## 2019-09-02 MED ORDER — IPRATROPIUM BROMIDE 0.02 % IN SOLN: 0.5000 mg | Freq: Four times a day (QID) | RESPIRATORY_TRACT | Status: DC | PRN

## 2019-09-02 MED ORDER — PRO-STAT SUGAR FREE PO LIQD
30.0000 mL | Freq: Two times a day (BID) | ORAL | Status: DC
  Administered 2019-09-02 – 2019-09-08 (×12): 30 mL via ORAL
  Filled 2019-09-02 (×14): qty 30

## 2019-09-02 MED ORDER — POLYETHYLENE GLYCOL 3350 17 G PO PACK: 17.0000 g | PACK | Freq: Every day | ORAL | Status: DC | PRN

## 2019-09-02 MED ORDER — FENTANYL 12 MCG/HR TD PT72
1.0000 | MEDICATED_PATCH | TRANSDERMAL | Status: DC
  Administered 2019-09-02 – 2019-09-06 (×2): 1 via TRANSDERMAL
  Filled 2019-09-02 (×3): qty 1

## 2019-09-02 MED ORDER — HYDROCORTISONE 1 % EX CREA
TOPICAL_CREAM | Freq: Four times a day (QID) | CUTANEOUS | Status: DC
  Filled 2019-09-02: qty 28

## 2019-09-02 MED ORDER — QUETIAPINE FUMARATE 25 MG PO TABS
25.0000 mg | ORAL_TABLET | Freq: Every day | ORAL | Status: DC
  Administered 2019-09-02 – 2019-09-04 (×3): 25 mg via ORAL
  Filled 2019-09-02 (×3): qty 1

## 2019-09-02 NOTE — Progress Notes (Signed)
Pt has become increasingly restless and uncooperative over the last few hours. Both Tacey Ruiz, NP from palliative and Dr. [place] aware of pt's current behavior. Pt's daughter back at bedside. Tele monitor was placed in pt's room, no available sitters at this timet. Pt's daughter expressed frustration with pt's current mental status. Called dialysis at start of shift, was told pt would go for treatment this afternoon. Called dialysis a second time this evening due to pt still waiting on treatment, was told pt would not go to dialysis until around midnight tonight. Originally held heparin and suppository due to pt going dialysis, but due to pt was not on their afternoon schedule, attempted to give both with other evening medications. Pt has refused heparin, aspirin, and suppository this evening. Pt's daughter at bedside, aware of pt's decisions. Pt's daughter states she is allowing her mother to have more say in her treatment and wants to step back at this time. Dr. [place] notified about pt's refusal of medications, new orders for PRN haldol to be given if needed.

## 2019-09-02 NOTE — Progress Notes (Signed)
Chaplain responded to spiritual care consult.  Pt appears uncomfortable and in pain and at times confused though daughter [name] states she is more lucid that she has been recently.  Daughter appears exhausted, alone, and overwhelmed. When asked about support people for her, she mentioned a boyfriend, but did not want to elaborate.  However, Allison's expression suggested that she doesn't have much emotional support.  Chaplain provided coffee for her (daughter) and then visited with pt while dau stepped out.  Pt is [employer], and mentioned several times that trusting God was a comfort to her, Psalm 23:1 is her favorite verse.  Pt became teary and emotional when chaplain recite Psalm 23. 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 Her greatest worry is for her daughter.   Please contact for f/u support.  Luana Shu [phone]   09/02/19 2000  Clinical Encounter Type  Visited With Patient and family together  Visit Type Initial;Spiritual support  Referral From Care management  Consult/Referral To Chaplain  Spiritual Encounters  Spiritual Needs Prayer;Emotional  Stress Factors  Patient Stress Factors Health changes  Family Stress Factors Exhausted

## 2019-09-02 NOTE — Plan of Care (Signed)
  Problem: Pain Managment: Goal: General experience of comfort will improve Outcome: Progressing   Problem: Safety: Goal: Ability to remain free from injury will improve Outcome: Progressing   
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
## 2019-09-02 NOTE — Progress Notes (Signed)
Pt's daughter aware pt has tele sitter in room.

## 2019-09-02 NOTE — Progress Notes (Signed)
Patient again restless, agitated and combative with care. IV pulled out by patient as well as telemetry leads. Patient noted to be yelling out. MD paged to request safety sitter. Charge nurse in room and aware. Daughter no longer at bedside. Staff will rotate 1:1 supervision until safety sitter becomes available.

## 2019-09-02 NOTE — NC FL2 (Signed)
[place] LEVEL OF CARE SCREENING TOOL     IDENTIFICATION  Patient Name: Valerie Terrell Birthdate: 04-22-1933 Sex: female Admission Date (Current Location): 08/31/2019  [facility] and [place] Number:  Herbalist and Address:  [facility] [place], [place] [address], [place], [place] [address]      Provider Number: [id number]  Attending Physician Name and Address:  [place] Donnamarie Poag, DO  Relative Name and Phone Number:       Current Level of Care: Hospital Recommended Level of Care: [place] Prior Approval Number:    Date Approved/Denied:   PASRR Number: [id number]  Discharge Plan: SNF    Current Diagnoses: Patient Active Problem List   Diagnosis Date Noted  . T12 compression fracture ([place]) 09/01/2019  . Acute diverticulitis 09/01/2019  . Diverticulitis 08/31/2019  . Pressure injury of skin 08/17/2019  . Volume overload 08/16/2019  . Disorder of phosphorus metabolism, unspecified 05/15/2019  . Hypoxemia 03/24/2019  . Anaphylactic shock, unspecified, sequela 02/23/2019  . Heterotopic ossification 12/29/2018  . Pain, unspecified 10/15/2018  . Hip fracture ([place]) 10/04/2018  . Fracture of pubic ramus ([place]) 07/19/2018  . Fracture of sacrum ([place]) 07/19/2018  . Perineal hematoma 07/19/2018  . Fever 07/19/2018  . Acute blood loss anemia 07/19/2018  . ESRD (end stage renal disease) ([place]) 05/20/2018  . Chronic back pain 05/14/2018  . Chronic pain syndrome 05/14/2018  . Iron deficiency anemia, unspecified 04/25/2018  . Hypoxia 12/14/2017  . Dyspnea 12/14/2017  . Secondary hyperparathyroidism of renal origin ([place]) 08/16/2017  . Degeneration of thoracic intervertebral disc 07/26/2017  . Moderate protein-calorie malnutrition ([place]) 02/23/2017  . Acute respiratory failure with hypoxia ([place]) 02/13/2017  . Hyperkalemia 02/13/2017  . Chronic respiratory failure with hypoxia ([place]) 02/11/2017  . Debilitated patient 02/07/2017  .  Poor tolerance for ambulation 02/07/2017  . Anemia in chronic kidney disease 02/04/2017  . Coagulation defect, unspecified ([place]) 02/04/2017  . Fracture of one rib, right side, initial encounter for closed fracture 01/11/2017  . Closed fracture of one rib of right side   . Chronic combined systolic and diastolic heart failure ([place]) 01/05/2017  . Palpitations 12/15/2016  . Hypokalemia 12/14/2016  . Leukocytosis 12/14/2016  . Long term (current) use of anticoagulants [Z79.01] 12/11/2016  . Non-ischemic cardiomyopathy ([place]) 12/07/2016  . Acute combined systolic and diastolic heart failure ([place])  12/07/2016  . Cardiac pacemaker   . Acute on chronic respiratory failure with hypoxia ([place])   . Acute pulmonary edema with congestive heart failure ([place]) 11/30/2016  . Elevated troponin level 11/30/2016  . Anemia 11/30/2016  . Chronic midline thoracic back pain 09/15/2016  . History of CVA (cerebrovascular accident) 09/15/2016  . Hyperlipidemia   . Nausea & vomiting 08/05/2016  . Dehydration 08/05/2016  . Osteoporosis 07/28/2016  . Closed compression fracture of thoracic vertebra ([place]) 07/23/2016  . Thoracic compression fracture ([place]) 07/22/2016  . Flank pain 07/11/2016  . Hypoalbuminemia 07/11/2016  . Renal artery stenosis ([place]) 06/24/2016  . Nondisplaced fracture of fifth metatarsal bone, left foot, initial encounter for closed fracture 06/10/2016  . Rib contusion, left, initial encounter 06/10/2016  . Single kidney 03/13/2016  . Chest pain   . Dyslipidemia   . PAF (paroxysmal atrial fibrillation) ([place]) 01/28/2016  . Essential hypertension 01/28/2016  . History of cardioembolic stroke 66/44/0347  . Personal history of subarachnoid hemorrhage 01/28/2016  . CKD (chronic kidney disease), stage IV ([place]) 01/28/2016  . Gait disturbance 01/27/2016  . Mitral valve disease 01/08/2014  .  Colon polyps 07/10/2013  . Lumbar radiculopathy 02/04/2011    Orientation RESPIRATION BLADDER Height &  Weight     Self, Time, Situation, Place  Normal Continent Weight:   Height:     BEHAVIORAL SYMPTOMS/MOOD NEUROLOGICAL BOWEL NUTRITION STATUS      Continent Diet(see discharge summary)  AMBULATORY STATUS COMMUNICATION OF NEEDS Skin   Extensive Assist Verbally PU Stage and Appropriate Care, Other (Comment)(PU stage 1 on sacrum; generalized ecchymosis) PU Stage 1 Dressing: (foam dressing on sacrum)                     Personal Care Assistance Level of Assistance  Bathing, Feeding, Dressing Bathing Assistance: Maximum assistance Feeding assistance: Limited assistance Dressing Assistance: Maximum assistance     Functional Limitations Info  Sight, Hearing, Speech Sight Info: Adequate Hearing Info: Adequate Speech Info: Adequate    SPECIAL CARE FACTORS FREQUENCY  PT (By licensed PT), OT (By licensed OT)     PT Frequency: 5x week OT Frequency: 5x week            Contractures Contractures Info: Not present    Additional Factors Info  Code Status, Allergies, Psychotropic Code Status Info: Full Code Allergies Info: Clonidine Derivatives, Adhesive (Tape), Codeine, Nickel, Sulfa Antibiotics Psychotropic Info: QUEtiapine (SEROQUEL) tablet 12.5 mg daily at bedtime PO         Current Medications (09/02/2019):  This is the current hospital active medication list Current Facility-Administered Medications  Medication Dose Route Frequency Provider Last Rate Last Admin  . amiodarone (PACERONE) tablet 200 mg  200 mg Oral Daily Rise Patience, MD   200 mg at 09/02/19 0902  . aspirin EC tablet 325 mg  325 mg Oral QPM Rise Patience, MD   325 mg at 09/01/19 1612  . bisacodyl (DULCOLAX) suppository 10 mg  10 mg Rectal Once [place] Eric J, DO      . calcitonin (salmon) (MIACALCIN/FORTICAL) nasal spray 1 spray  1 spray Alternating Nares Daily Rise Patience, MD   1 spray at 09/02/19 0904  . cefTRIAXone (ROCEPHIN) 2 g in sodium chloride 0.9 % 100 mL IVPB  2 g  Intravenous Q24H Rise Patience, MD 200 mL/hr at 09/01/19 1949 2 g at 09/01/19 1949  . Chlorhexidine Gluconate Cloth 2 % PADS 6 each  6 each Topical Daily Rise Patience, MD   6 each at 09/02/19 [phone]  . doxercalciferol (HECTOROL) injection 3 mcg  3 mcg Intravenous Q T,Th,Sa-HD Stovall, Kathryn R, PA-C      . feeding supplement (PRO-STAT SUGAR FREE 64) liquid 30 mL  30 mL Oral BID Loren Racer, PA-C      . fentaNYL (DURAGESIC) 50 MCG/HR 1 patch  1 patch Transdermal Q72H Rise Patience, MD   1 patch at 09/02/19 0843  . heparin injection 5,000 Units  5,000 Units Subcutaneous Q8H Rise Patience, MD   5,000 Units at 09/02/19 [phone]  . isosorbide mononitrate (IMDUR) 24 hr tablet 60 mg  60 mg Oral Daily Rise Patience, MD      . melatonin tablet 3 mg  3 mg Oral QHS [place] Eric J, DO      . metroNIDAZOLE (FLAGYL) IVPB 500 mg  500 mg Intravenous Q8H Rise Patience, MD 100 mL/hr at 09/02/19 0645 500 mg at 09/02/19 0645  . ondansetron (ZOFRAN) tablet 4 mg  4 mg Oral Q6H PRN Rise Patience, MD   4 mg at 09/01/19 2246   Or  .  ondansetron (ZOFRAN) injection 4 mg  4 mg Intravenous Q6H PRN Rise Patience, MD   4 mg at 09/02/19 0844  . oxyCODONE (Oxy IR/ROXICODONE) immediate release tablet 10-20 mg  10-20 mg Oral Q6H PRN Rise Patience, MD   10 mg at 09/01/19 2247  . polyethylene glycol (MIRALAX / GLYCOLAX) packet 17 g  17 g Oral Daily PRN [place] Eric J, DO      . pravastatin (PRAVACHOL) tablet 40 mg  40 mg Oral QHS Rise Patience, MD   40 mg at 09/01/19 2246  . QUEtiapine (SEROQUEL) tablet 12.5 mg  12.5 mg Oral QHS [place] Eric J, DO      . senna-docusate (Senokot-S) tablet 2 tablet  2 tablet Oral BID [place] Eric J, DO      . sevelamer carbonate (RENVELA) tablet 1,600 mg  1,600 mg Oral TID WC Rise Patience, MD      . sodium chloride flush (NS) 0.9 % injection 3 mL  3 mL Intravenous Once Rise Patience, MD         Discharge  Medications: Please see discharge summary for a list of discharge medications.  Relevant Imaging Results:  Relevant Lab Results:   Additional Information SSN: [id number]; HD TTS at [place], LCSW

## 2019-09-02 NOTE — Progress Notes (Addendum)
PROGRESS NOTE    Valerie Terrell  DEY:[id number] DOB: 27-Sep-1932 DOA: 08/31/2019 PCP: Tammi Sou, MD    Brief Narrative:  Valerie Terrell is a 86 y.o. female with history of ESRD on hemodialysis on Tuesday Thursday Saturdays who was recently admitted for fluid overload and discharged on August 17, 2019 presents to the ER because of abdominal pain.  Patient states she has been having abdominal pain generalized for the last 1 week which has been gradually worsening.  Over the last 2 days patient also had some nausea vomiting.  Denies any blood in the vomitus.  Denies any fall or trauma.  States she does not miss her dialysis.  Denies chest pain or shortness of breath.  No fever chills.  In the ER CT scan of the abdomen pelvis shows diverticulitis of sigmoid colon and also a new T12 compression fracture.  Patient is able to move extremities and denies any incontinence of urine or bowel.  Labs show WBC count of 18 potassium 3.8 creatinine 4.2 LFTs normal except for albumin of 3.1.  EKG shows normal sinus rhythm.  Covid test was negative.  On exam abdomen appears benign.  Patient is able to move all extremities.  Patient was started on ceftriaxone and Flagyl for diverticulitis.   Assessment & Plan:   Principal Problem:   Diverticulitis Active Problems:   PAF (paroxysmal atrial fibrillation) (HCC)   Essential hypertension   History of CVA (cerebrovascular accident)   Cardiac pacemaker   Non-ischemic cardiomyopathy ([place])   ESRD (end stage renal disease) ([place])   T12 compression fracture ([place])   Acute diverticulitis   Acute sigmoid diverticulitis Patient presenting with progressive abdominal pain over the past week.  Patient is afebrile with a leukocytosis of 18.0.  CT abdomen/pelvis notable for slight differential wall thickening sigmoid colon with pericolonic fat stranding consistent with acute diverticulitis. --WBC 18.0-->12.9-->12.6 --Continue antibiotics with ceftriaxone and  Flagyl --Clear liquid diet; advance as tolerates --Supportive care, antiemetics prn  New T12 compression fracture with intractable pain Hx of chronic pain CT abdomen/pelvis notable for T12 compression fracture which appears new with less than 25% height loss.  Patient with history of chronic pain, now with intractable low back pain. --IR consulted to evaluate for kyphoplasty, plan next week once acute infectious process as above has been controlled --continue home fentanyl patch 50 mcg q72h --Continue home oxycodone IR 10-20mg  q6h prn --If pain continues to be difficult to control, will consult palliative care for assistance given her underlying chronic pain issues as well --Continue PT/OT efforts, currently recommending SNF placement, patient/daughter reluctant, but will agree to have social work send out referrals in case patient does not improve for the next few days.  ESRD on HD MTTS Recently admitted and discharged on August 17, 2019 for volume overload.  Now receives hemodialysis 4 days/week.  Dialyzes at [facility] dialysis center.  Missed dialysis on 08/31/2019.   --Nephrology following, appreciate assistance --Electrolytes stable --Plan to resume HD today --Continue to monitor electrolytes  History of tachybradycardia syndrome s/p PPM Paroxysmal atrial fibrillation Currently rate controlled --Continue amiodarone 200 mg p.o. daily --Not on chronic anticoagulation secondary to frequent falls --Discontinue telemetry  HLD: Continue pravastatin 40 mg p.o. daily  History of nonischemic cardiomyopathy Essential hypertension TTE 11/2017 with EF 55-60% (improved from 35% prior), grade 2 diastolic dysfunction, mild/moderate MR, LA moderately dilated, mild TR --Continue Imdur 60 mg p.o. daily  Confusion with agitation Patient with confusion and agitation, possible underlying dementia coupled with acute hospital-acquired delirium  in the setting of infectious process as above. --Seroquel  12.5 mg p.o. nightly --Melatonin 3 mg p.o. daily  Constipation --Senokot 2 tabs twice daily --bisacodyl 10mg  supp today --Miralax prn daily  Ethics Patient now with 2 hospitalization over the past month, initially hospitalized for volume overload, now goes outpatient dialysis 4 times weekly which is fairly new.  Now readmitted with acute sigmoid diverticulitis with subacute T12 fracture.  Followed very recently by [facility] palliative care outpatient.  Daughter slightly emotional this morning regarding life expectancy of her mother, she seems to be having a very hard time managing her alone with very little family support as other family members reside in [place] and have their own chronic medical conditions.  Will consult palliative care for assistance in goals of care and medical decision making moving forward.  Do have concerns regarding her advanced age, HD dependency and continued confusion, suspect some underlying dementia which is exacerbated by recurrent hospitalizations with acute delirium.   DVT prophylaxis: Heparin Code Status: Full code Family Communication: Updated daughter who is present at bedside  Disposition Plan:  Status is: Inpatient  Remains inpatient appropriate because:Ongoing active pain requiring inpatient pain management, Ongoing diagnostic testing needed not appropriate for outpatient work up, Unsafe d/c plan and Inpatient level of care appropriate due to severity of illness pending IR kyphoplasty   Dispo: The patient is from: Home              Anticipated d/c is to: Home vs SNF              Anticipated d/c date is: 3 days              Patient currently is not medically stable to d/c.    Consultants:   IR  Procedures:   T12 kyphoplasty: Pending  Antimicrobials:   Ceftriaxone 4/15>>  Flagyl 4/15>>   Subjective: Patient seen and examined bedside, daughter present.  Patient with confusion and agitation overnight, currently slightly confused.   States that she was not "getting the attention/care" that she needed overnight.  Has had episode of nausea and vomiting this morning.  Daughter slightly emotional this morning regarding life expectancy of her mother with her underlying confusion and concerns about her inability to tolerate further hemodialysis outpatient.  No other complaints or concerns at this time.  Denies headache, no fever/chills/night sweats, no diarrhea, no chest pain, no palpitations, no shortness of breath, no weakness.  No other acute events overnight per nursing staff.  Objective: Vitals:   09/01/19 1340 09/01/19 2000 09/02/19 0300 09/02/19 0727  BP: (!) 137/58 (!) 153/53 (!) 145/62 (!) 162/61  Pulse: 69 86 80 86  Resp: 17 18 17 15   Temp: 98.4 F (36.9 C) 98.2 F (36.8 C) 97.8 F (36.6 C) 98.1 F (36.7 C)  TempSrc: Oral Axillary Axillary Oral  SpO2: 100% 96% 91% (!) 83%    Intake/Output Summary (Last 24 hours) at 09/02/2019 1153 Last data filed at 09/02/2019 1100 Gross per 24 hour  Intake 480 ml  Output --  Net 480 ml   There were no vitals filed for this visit.  Examination:  General exam: Alert, calm, currently appears comfortable, slightly confused this morning Respiratory system: Clear to auscultation. Respiratory effort normal.  Oxygenating well on room air Cardiovascular system: S1 & S2 heard, RRR. No JVD, murmurs, rubs, gallops or clicks. No pedal edema. Gastrointestinal system: Abdomen is nondistended, soft, significant tenderness. No organomegaly or masses felt.  Slightly hypoactive bowel sounds, but present.  Central nervous system: Alert, oriented to place, but not time (20) and situation. No focal neurological deficits. Extremities: Symmetric 5 x 5 power.  Moves all extremities independently Skin: No rashes, lesions or ulcers Psychiatry: Judgement and insight appear poor. Mood & affect appropriate.     Data Reviewed: I have personally reviewed following labs and imaging  studies  CBC: Recent Labs  Lab 08/31/19 1300 09/01/19 0449 09/02/19 0654  WBC 18.0* 12.9* 12.6*  HGB 13.1 11.0* 11.4*  HCT 42.7 34.9* 35.6*  MCV 100.0 97.8 97.0  PLT 189 154 676   Basic Metabolic Panel: Recent Labs  Lab 08/31/19 1300 09/01/19 0449 09/02/19 0654  NA 137 133* 133*  K 3.8 4.1 4.5  CL 92* 91* 92*  CO2 28 27 24   GLUCOSE 122* 88 90  BUN 16 25* 34*  CREATININE 4.23* 4.98* 6.34*  CALCIUM 8.9 8.4* 8.1*  MG  --   --  2.2  PHOS  --   --  7.7*   GFR: CrCl cannot be calculated (Unknown ideal weight.). Liver Function Tests: Recent Labs  Lab 08/31/19 1300 09/02/19 0654  AST 24  --   ALT 15  --   ALKPHOS 102  --   BILITOT 0.7  --   PROT 6.8  --   ALBUMIN 3.1* 2.4*   Recent Labs  Lab 08/31/19 1300  LIPASE 27   No results for input(s): AMMONIA in the last 168 hours. Coagulation Profile: No results for input(s): INR, PROTIME in the last 168 hours. Cardiac Enzymes: No results for input(s): CKTOTAL, CKMB, CKMBINDEX, TROPONINI in the last 168 hours. BNP (last 3 results) No results for input(s): PROBNP in the last 8760 hours. HbA1C: No results for input(s): HGBA1C in the last 72 hours. CBG: No results for input(s): GLUCAP in the last 168 hours. Lipid Profile: No results for input(s): CHOL, HDL, LDLCALC, TRIG, CHOLHDL, LDLDIRECT in the last 72 hours. Thyroid Function Tests: No results for input(s): TSH, T4TOTAL, FREET4, T3FREE, THYROIDAB in the last 72 hours. Anemia Panel: No results for input(s): VITAMINB12, FOLATE, FERRITIN, TIBC, IRON, RETICCTPCT in the last 72 hours. Sepsis Labs: No results for input(s): PROCALCITON, LATICACIDVEN in the last 168 hours.  Recent Results (from the past 240 hour(s))  SARS CORONAVIRUS 2 (TAT 6-24 HRS) Nasopharyngeal Nasopharyngeal Swab     Status: None   Collection Time: 08/31/19  8:32 PM   Specimen: Nasopharyngeal Swab  Result Value Ref Range Status   SARS Coronavirus 2 NEGATIVE NEGATIVE Final    Comment:  (NOTE) SARS-CoV-2 target nucleic acids are NOT DETECTED. The SARS-CoV-2 RNA is generally detectable in upper and lower respiratory specimens during the acute phase of infection. Negative results do not preclude SARS-CoV-2 infection, do not rule out co-infections with other pathogens, and should not be used as the sole basis for treatment or other patient management decisions. Negative results must be combined with clinical observations, patient history, and epidemiological information. The expected result is Negative. Fact Sheet for Patients: SugarRoll.be Fact Sheet for Healthcare Providers: https://www.woods-mathews.com/ This test is not yet approved or cleared by the [place] FDA and  has been authorized for detection and/or diagnosis of SARS-CoV-2 by FDA under an Emergency Use Authorization (EUA). This EUA will remain  in effect (meaning this test can be used) for the duration of the COVID-19 declaration under Section 56 4(b)(1) of the Act, 21 U.S.C. section 360bbb-3(b)(1), unless the authorization is terminated or revoked sooner. Performed at [facility], [address] [place],  [place] [address]          Radiology Studies: CT ABDOMEN PELVIS WO CONTRAST  Result Date: 08/31/2019 CLINICAL DATA:  Acute on chronic back pain for several days, emesis, short of breath EXAM: CT ABDOMEN AND PELVIS WITHOUT CONTRAST TECHNIQUE: Multidetector CT imaging of the abdomen and pelvis was performed following the standard protocol without IV contrast. COMPARISON:  12/13/2018 FINDINGS: Lower chest: Hypoventilatory changes are seen at the lung bases. There is chronic scarring at the left lung base. Diffuse atherosclerosis of the aorta and coronary vessels again noted. Extensive calcification of the aortic and mitral valves. Pacemaker unchanged. Hepatobiliary: Gallbladder sludge is identified without cholecystitis. The liver is unremarkable.  Pancreas: Unremarkable. No pancreatic ductal dilatation or surrounding inflammatory changes. Spleen: Normal in size without focal abnormality. Adrenals/Urinary Tract: Progressive bilateral renal cortical atrophy consistent with end-stage renal disease. Stable cysts right kidney. No urinary tract calculi or obstruction. Bladder is unremarkable. The adrenals are unremarkable. Stomach/Bowel: There is circumferential wall thickening of the mid sigmoid colon, reference image 64. Mild pericolonic fat stranding consistent with acute colitis or diverticulitis. No bowel obstruction or ileus. The appendix is surgically absent. Vascular/Lymphatic: There is severe atherosclerosis throughout the aorta and its branches. No pathologic adenopathy. Reproductive: Uterus is markedly atrophic.  No adnexal masses. Other: No free fluid or free gas. Diffuse atrophy of the abdominal wall musculature unchanged. Musculoskeletal: Chronic T10 compression deformity with previous vertebral augmentation. There is an age-indeterminate T12 compression fracture, new since prior CT. Less than 25% loss of height. No significant paraspinal hematoma. No additional fractures. Postsurgical changes right hip. Reconstructed images demonstrate no additional findings. IMPRESSION: 1. Circumferential wall thickening of the mid sigmoid colon with mild pericolonic fat stranding, consistent with acute uncomplicated diverticulitis or colitis. 2. New T12 compression deformity, with less than 25% loss of height. Based on appearance, this could reflect an acute or subacute fracture. 3. Sequela of end-stage renal disease. 4. Aortic Atherosclerosis (ICD10-I70.0). Severe coronary artery atherosclerosis. Electronically Signed   By: Randa Ngo M.D.   On: 08/31/2019 19:37   DG Chest Port 1 View  Result Date: 08/31/2019 CLINICAL DATA:  Shortness of breath EXAM: PORTABLE CHEST 1 VIEW COMPARISON:  August 16, 2019 and April 13, 2019 FINDINGS: There is suspected  scarring in the left base. There may also be epicardial fat prominence in this area. The appearance is stable. There is no apparent edema or airspace opacity. Heart is upper normal in size with pulmonary vascularity normal. Pacemaker lead tips are attached to the right atrium and right ventricle. No adenopathy. There is aortic atherosclerosis. Bones are osteoporotic. Patient is undergone prior kyphoplasty procedure in the lower thoracic region IMPRESSION: Suspect a degree of scarring in the lateral left base. No edema or airspace opacity. Stable cardiac silhouette. Pacemaker leads attached to right atrium and right ventricle. Bones osteoporotic. Aortic Atherosclerosis (ICD10-I70.0). Electronically Signed   By: Lowella Grip III M.D.   On: 08/31/2019 15:29        Scheduled Meds: . amiodarone  200 mg Oral Daily  . aspirin EC  325 mg Oral QPM  . bisacodyl  10 mg Rectal Once  . calcitonin (salmon)  1 spray Alternating Nares Daily  . Chlorhexidine Gluconate Cloth  6 each Topical Daily  . doxercalciferol  3 mcg Intravenous Q T,Th,Sa-HD  . feeding supplement (PRO-STAT SUGAR FREE 64)  30 mL Oral BID  . fentaNYL  1 patch Transdermal Q72H  . heparin  5,000 Units Subcutaneous Q8H  . isosorbide mononitrate  60 mg Oral Daily  . melatonin  3 mg Oral QHS  . pravastatin  40 mg Oral QHS  . QUEtiapine  12.5 mg Oral QHS  . senna-docusate  2 tablet Oral BID  . sevelamer carbonate  1,600 mg Oral TID WC  . sodium chloride flush  3 mL Intravenous Once   Continuous Infusions: . cefTRIAXone (ROCEPHIN)  IV 2 g (09/01/19 1949)  . metronidazole 500 mg (09/02/19 0645)     LOS: 1 day    Time spent: 40 minutes spent on chart review, discussion with nursing staff, consultants, updating family and interview/physical exam; more than 50% of that time was spent in counseling and/or coordination of care.    Taj Nevins J [place] DO [facility] Available via Epic secure chat 7am-7pm After these hours, please  refer to coverage provider listed on [email or web address] 09/02/2019, 11:53 AM

## 2019-09-02 NOTE — Progress Notes (Signed)
Pt states she does not feel comfortable with a suppository before her hemodialysis treatment today. One time dose moved to this evening, will administer once pt is back from hemodialysis. Pt has been removing oxygen via nasal cannula throughout the day. Pt currently has oxygen on, pt educated. Dr. [place] notified in person while on the unit.

## 2019-09-02 NOTE — TOC Progression Note (Signed)
Transition of Care [facility]) - Progression Note    Patient Details  Name: Valerie Terrell MRN: [id number] Date of Birth: 1933-02-19  Transition of Care [facility]) CM/SW [place], [place] Phone Number: 09/02/2019, 10:15 AM  Clinical Narrative:    Received consult as entered by bedside RN regarding [place] and code status. CSW has requested that attending MD enter PMT consult to assist with [place] discussions. TOC team available to assist with disposition once goals more clear.    Expected Discharge Plan: Skilled Nursing Facility(vs home with home health) Barriers to Discharge: Continued Medical Work up  Expected Discharge Plan and Services Expected Discharge Plan: Skilled Nursing Facility(vs home with home health)   Readmission Risk Interventions Readmission Risk Prevention Plan 10/06/2018  Transportation Screening Complete  Medication Review ([place]) Complete  PCP or Specialist appointment within 3-5 days of discharge Complete  HRI or Home Care Consult Complete  SW Recovery Care/Counseling Consult Complete  Palliative Care Screening Not [place] Complete  Some recent data might be hidden

## 2019-09-02 NOTE — Progress Notes (Signed)
telesitter not available yet. Pt on waiting list.

## 2019-09-03 ENCOUNTER — Encounter (HOSPITAL_COMMUNITY): Payer: Self-pay | Admitting: Internal Medicine

## 2019-09-03 LAB — CBC
HCT: 31.9 % — ABNORMAL LOW (ref 36.0–46.0)
HCT: 35.1 % — ABNORMAL LOW (ref 36.0–46.0)
Hemoglobin: 10.2 g/dL — ABNORMAL LOW (ref 12.0–15.0)
Hemoglobin: 11.3 g/dL — ABNORMAL LOW (ref 12.0–15.0)
MCH: 30.7 pg (ref 26.0–34.0)
MCH: 30.6 pg (ref 26.0–34.0)
MCHC: 32 g/dL (ref 30.0–36.0)
MCHC: 32.2 g/dL (ref 30.0–36.0)
MCV: 96.1 fL (ref 80.0–100.0)
MCV: 95.1 fL (ref 80.0–100.0)
Platelets: 177 10*3/uL (ref 150–400)
Platelets: 188 10*3/uL (ref 150–400)
RBC: 3.32 MIL/uL — ABNORMAL LOW (ref 3.87–5.11)
RBC: 3.69 MIL/uL — ABNORMAL LOW (ref 3.87–5.11)
RDW: 15.5 % (ref 11.5–15.5)
RDW: 15.5 % (ref 11.5–15.5)
WBC: 8.6 10*3/uL (ref 4.0–10.5)
WBC: 11 10*3/uL — ABNORMAL HIGH (ref 4.0–10.5)
nRBC: 0 % (ref 0.0–0.2)
nRBC: 0 % (ref 0.0–0.2)

## 2019-09-03 LAB — VITAMIN B12: Vitamin B-12: 579 pg/mL (ref 180–914)

## 2019-09-03 LAB — BASIC METABOLIC PANEL
Anion gap: 13 (ref 5–15)
BUN: 15 mg/dL (ref 8–23)
CO2: 27 mmol/L (ref 22–32)
Calcium: 7.1 mg/dL — ABNORMAL LOW (ref 8.9–10.3)
Chloride: 95 mmol/L — ABNORMAL LOW (ref 98–111)
Creatinine, Ser: 3.3 mg/dL — ABNORMAL HIGH (ref 0.44–1.00)
GFR calc Af Amer: 14 mL/min — ABNORMAL LOW (ref >60)
GFR calc non Af Amer: 12 mL/min — ABNORMAL LOW (ref >60)
Glucose, Bld: 78 mg/dL (ref 70–99)
Potassium: 2.7 mmol/L — CL (ref 3.5–5.1)
Sodium: 135 mmol/L (ref 135–145)

## 2019-09-03 LAB — RENAL FUNCTION PANEL
Albumin: 2.4 g/dL — ABNORMAL LOW (ref 3.5–5.0)
Anion gap: 16 — ABNORMAL HIGH (ref 5–15)
BUN: 8 mg/dL (ref 8–23)
CO2: 26 mmol/L (ref 22–32)
Calcium: 7.6 mg/dL — ABNORMAL LOW (ref 8.9–10.3)
Chloride: 96 mmol/L — ABNORMAL LOW (ref 98–111)
Creatinine, Ser: 2.96 mg/dL — ABNORMAL HIGH (ref 0.44–1.00)
GFR calc Af Amer: 16 mL/min — ABNORMAL LOW (ref >60)
GFR calc non Af Amer: 14 mL/min — ABNORMAL LOW (ref >60)
Glucose, Bld: 78 mg/dL (ref 70–99)
Phosphorus: 4.1 mg/dL (ref 2.5–4.6)
Potassium: 3.8 mmol/L (ref 3.5–5.1)
Sodium: 138 mmol/L (ref 135–145)

## 2019-09-03 LAB — HEPATITIS B SURFACE ANTIGEN: Hepatitis B Surface Ag: NONREACTIVE

## 2019-09-03 MED ORDER — DOXERCALCIFEROL 4 MCG/2ML IV SOLN
INTRAVENOUS | Status: AC
  Filled 2019-09-03: qty 2

## 2019-09-03 MED ORDER — POTASSIUM CHLORIDE CRYS ER 20 MEQ PO TBCR
30.0000 meq | EXTENDED_RELEASE_TABLET | ORAL | Status: DC
  Filled 2019-09-03: qty 1

## 2019-09-03 NOTE — Progress Notes (Signed)
[facility])  Ms. Augusta is our current palliative pt in the community.  [facility] will continue to follow her during hospitalization and resume services once she is discharged.  Venia Carbon RN, BSN, [facility] Liaison

## 2019-09-03 NOTE — TOC Progression Note (Signed)
Transition of Care [facility]) - Progression Note    Patient Details  Name: Valerie Terrell MRN: [id number] Date of Birth: August 15, 1932  Transition of Care [facility]) CM/SW [place], [place] Phone Number: [phone] 09/03/2019, 3:00 PM  Clinical Narrative:     CSW was alerted about a consult for caregiver supports for patient's daughter [name]. CSW spoke with [name] via phone and provided her with 2 local resources that she could utilize.  TOC team will continue to follow for discharge planning needs.  Expected Discharge Plan: Skilled Nursing Facility(vs home with home health) Barriers to Discharge: Continued Medical Work up  Expected Discharge Plan and Services Expected Discharge Plan: Skilled Nursing Facility(vs home with home health)                                               Social Determinants of Health (SDOH) Interventions    Readmission Risk Interventions Readmission Risk Prevention Plan 10/06/2018  Transportation Screening Complete  Medication Review Press photographer) Complete  PCP or Specialist appointment within 3-5 days of discharge Complete  HRI or Home Care Consult Complete  SW Recovery Care/Counseling Consult Complete  Palliative Care Screening Not [place] Complete  Some recent data might be hidden

## 2019-09-03 NOTE — Evaluation (Signed)
Occupational Therapy Evaluation Patient Details Name: Valerie Terrell MRN: [id number] DOB: [birth year] Today's Date: 09/03/2019    History of Present Illness  Valerie Terrell is a 86 y.o. female with history of ESRD on hemodialysis on Tuesday Thursday Saturdays who was recently admitted for fluid overload and discharged on August 17, 2019 presents to the ER because of abdominal pain. CT showed diverticulitis of sigmoid colon and new T12 compression fracture. Started on ceftriaxone and Flagyl for diverticulitis.    Clinical Impression   This 86 yo female admitted with above presents to acute OT with unknown PLOF (due to lethargy today) but presenting today to acute OT at a total A from basic ADLs and mobility due to lethargy (of note, pt taken to HD at 3:30 AM this morning). She will continue to benefit from acute OT with follow up at SNF.    Follow Up Recommendations  SNF;Supervision/Assistance - 24 hour    Equipment Recommendations  Other (comment)(TBD at next venue)       Precautions / Restrictions Precautions Precautions: Fall Restrictions Weight Bearing Restrictions: No      Mobility Bed Mobility Overal bed mobility: Needs Assistance Bed Mobility: Supine to Sit;Sit to Supine     Supine to sit: Total assist Sit to supine: Total assist   General bed mobility comments: Pt cued to move her legs to the right, she initiated but then stopped     Balance Overall balance assessment: Needs assistance Sitting-balance support: No upper extremity supported;Feet supported Sitting balance-Leahy Scale: Zero Sitting balance - Comments: listing to right without attempt to correct                                   ADL either performed or assessed with clinical judgement   ADL Overall ADL's : Needs assistance/impaired                                       General ADL Comments: Pt currently total A due to lethargy, falling asleep intermittently during eval  (had HD earlier this AM)                  Pertinent Vitals/Pain Pain Assessment: Faces Faces Pain Scale: Hurts little more Pain Location: back Pain Descriptors / Indicators: Grimacing;Guarding;Moaning Pain Intervention(s): Limited activity within patient's tolerance;Monitored during session;Repositioned        Extremity/Trunk Assessment Upper Extremity Assessment Upper Extremity Assessment: Generalized weakness           Communication  impaired due to lethargy   Cognition Arousal/Alertness: Lethargic(suspect due to HD eariler this AM) Behavior During Therapy: Flat affect Overall Cognitive Status: Impaired/Different from baseline Area of Impairment: Orientation;Following commands;Safety/judgement;Problem solving                 Orientation Level: Disoriented to;Place;Time     Following Commands: Follows one step commands inconsistently Safety/Judgement: Decreased awareness of safety;Decreased awareness of deficits   Problem Solving: Slow processing;Decreased initiation;Difficulty sequencing;Requires verbal cues;Requires tactile cues General Comments: Pt falling asleep intermittently during session              Home Living Family/patient expects to be discharged to:: Skilled nursing facility  OT Problem List: Decreased strength;Decreased activity tolerance;Impaired balance (sitting and/or standing);Decreased cognition;Pain      OT Treatment/Interventions: Self-care/ADL training;DME and/or AE instruction;Patient/family education;Balance training    OT Goals(Current goals can be found in the care plan section) Acute Rehab OT Goals Patient Stated Goal: unable to state (lethargy) OT Goal Formulation: Patient unable to participate in goal setting Time For Goal Achievement: 09/17/19 Potential to Achieve Goals: Fair  OT Frequency: Min 2X/week              AM-PAC OT "6 Clicks" Daily  Activity     Outcome Measure Help from another person eating meals?: Total Help from another person taking care of personal grooming?: Total Help from another person toileting, which includes using toliet, bedpan, or urinal?: Total Help from another person bathing (including washing, rinsing, drying)?: Total Help from another person to put on and taking off regular upper body clothing?: Total Help from another person to put on and taking off regular lower body clothing?: Total 6 Click Score: 6   End of Session Nurse Communication: (Pt very lethargic and sleepy--RN reports she has been this way since coming back from dialysis (they took her at 3:30 AM).)  Activity Tolerance: Patient limited by lethargy Patient left: in bed;with call bell/phone within reach;with bed alarm set  OT Visit Diagnosis: Other abnormalities of gait and mobility (R26.89);Muscle weakness (generalized) (M62.81);Pain Pain - part of body: (back)                Time: 4801-6553 OT Time Calculation (min): 13 min Charges:  OT General Charges $OT Visit: 1 Visit OT Evaluation $OT Eval Moderate Complexity: 1 Mod  [place] [address], OTR/L Acute [employer] Pager [phone] Office [phone]     [place] Register 09/03/2019, 12:20 PM

## 2019-09-03 NOTE — Plan of Care (Signed)

## 2019-09-03 NOTE — Progress Notes (Signed)
PROGRESS NOTE    Valerie Terrell  URK:[id number] DOB: May 05, 1933 DOA: 08/31/2019 PCP: Tammi Sou, MD    Brief Narrative:  Valerie Terrell is a 86 y.o. female with history of ESRD on hemodialysis on Tuesday Thursday Saturdays who was recently admitted for fluid overload and discharged on August 17, 2019 presents to the ER because of abdominal pain.  Patient states she has been having abdominal pain generalized for the last 1 week which has been gradually worsening.  Over the last 2 days patient also had some nausea vomiting.  Denies any blood in the vomitus.  Denies any fall or trauma.  States she does not miss her dialysis.  Denies chest pain or shortness of breath.  No fever chills.  In the ER CT scan of the abdomen pelvis shows diverticulitis of sigmoid colon and also a new T12 compression fracture.  Patient is able to move extremities and denies any incontinence of urine or bowel.  Labs show WBC count of 18 potassium 3.8 creatinine 4.2 LFTs normal except for albumin of 3.1.  EKG shows normal sinus rhythm.  Covid test was negative.  On exam abdomen appears benign.  Patient is able to move all extremities.  Patient was started on ceftriaxone and Flagyl for diverticulitis.   Assessment & Plan:   Principal Problem:   Diverticulitis Active Problems:   PAF (paroxysmal atrial fibrillation) (HCC)   Essential hypertension   History of CVA (cerebrovascular accident)   Cardiac pacemaker   Non-ischemic cardiomyopathy ([place])   ESRD (end stage renal disease) ([place])   T12 compression fracture ([place])   Acute diverticulitis   Palliative care by specialist   Goals of care, counseling/discussion   DNR (do not resuscitate)   Inadequate pain control   Acute sigmoid diverticulitis Patient presenting with progressive abdominal pain over the past week.  Patient is afebrile with a leukocytosis of 18.0.  CT abdomen/pelvis notable for slight differential wall thickening sigmoid colon with pericolonic fat  stranding consistent with acute diverticulitis. --WBC 18.0-->12.9-->12.6-->8.6 --Continue antibiotics with ceftriaxone and Flagyl --Clear liquid diet; advance as tolerates --Supportive care, antiemetics prn  New T12 compression fracture with intractable pain Hx of chronic pain CT abdomen/pelvis notable for T12 compression fracture which appears new with less than 25% height loss.  Patient with history of chronic pain, now with intractable low back pain. --Telemetry care following and assisting with pain management, appreciate assistance --IR consulted to evaluate for kyphoplasty, plan next week once acute infectious process as above has been controlled -- fentanyl patch increased to 62 mcg q72h --Continue home oxycodone IR 10-20mg  q6h prn --Calcitriol 1 spray nasal daily, closely monitoring calcium levels daily --Continue PT/OT efforts, currently recommending SNF placement, patient/daughter reluctant, but will agree to have social work send out referrals in case patient does not improve for the next few days.  ESRD on HD MTTS Recently admitted and discharged on August 17, 2019 for volume overload.  Now receives hemodialysis 4 days/week.  Dialyzes at [facility] dialysis center.  Missed dialysis on 08/31/2019.   --Nephrology following, appreciate assistance --Electrolytes stable --Continue HD, session today --Continue to monitor electrolytes  History of tachybrady syndrome s/p PPM Paroxysmal atrial fibrillation Currently rate controlled --Continue amiodarone 200 mg p.o. daily --Not on chronic anticoagulation secondary to frequent falls --Discontinued telemetry 4/17  HLD: Continue pravastatin 40 mg p.o. daily  History of nonischemic cardiomyopathy Essential hypertension TTE 11/2017 with EF 55-60% (improved from 35% prior), grade 2 diastolic dysfunction, mild/moderate MR, LA moderately dilated, mild TR --Continue  Imdur 60 mg p.o. daily  Confusion with agitation Patient with confusion and  agitation, possible underlying dementia coupled with acute hospital-acquired delirium in the setting of infectious process as above. --Seroquel 25 mg p.o. nightly --Melatonin 3 mg p.o. daily  Constipation --Senokot 2 tabs twice daily --bisacodyl 10mg  supp today --Miralax prn daily  Ethics Patient now with 2 hospitalization over the past month, initially hospitalized for volume overload, now goes outpatient dialysis 4 times weekly which is fairly new.  Now readmitted with acute sigmoid diverticulitis with subacute T12 fracture.  Followed very recently by [facility] palliative care outpatient. Daughter slightly emotional this morning regarding life expectancy of her mother, she seems to be having a very hard time managing her alone with very little family support as other family members reside in [place] and have their own chronic medical conditions.  Palliative care following for assistance in goals of care and medical decision making.  CODE STATUS now changed to DNR.  Do have concerns regarding her advanced age, HD dependency and continued confusion, suspect some underlying dementia which is exacerbated by recurrent hospitalizations with acute delirium.   DVT prophylaxis: Heparin Code Status: DNR Family Communication: Updated daughter who is present at bedside  Disposition Plan:  Status is: Inpatient  Remains inpatient appropriate because:Ongoing active pain requiring inpatient pain management, Ongoing diagnostic testing needed not appropriate for outpatient work up, Unsafe d/c plan and Inpatient level of care appropriate due to severity of illness pending IR kyphoplasty, further goals of care discussions with palliative care   Dispo: The patient is from: Home              Anticipated d/c is to: Home vs SNF              Anticipated d/c date is: 3 days              Patient currently is not medically stable to d/c.    Consultants:   IR  Procedures:   T12 kyphoplasty:  Pending  Antimicrobials:   Ceftriaxone 4/15>>  Flagyl 4/15>>   Subjective: Patient seen and examined bedside, following return from hemodialysis morning.  Discussed with patient's daughter, Valerie Terrell while her mother was at hemodialysis; she appears fatigued and tired.  He also reports that her mother slept better last night with Seroquel and melatonin; although was brought to hemodialysis early this morning at 3:30 AM.  She is still struggling with overall care of her mother as this falls all on herself and her mother's fairly steady decline.  She reports that her mother has had very little oral intake, relatively no desire to eat.  Patient somnolent this morning after return from HD, but arousable.  Not complaining of any pain at this time.  Labs this morning notable for low potassium, although was drawn during HD. No other complaints or concerns at this time.  Denies headache, no fever/chills/night sweats, no diarrhea, no chest pain, no palpitations, no shortness of breath, no weakness.  No other acute events overnight per nursing staff.  Objective: Vitals:   09/02/19 1431 09/02/19 1932 09/03/19 0847 09/03/19 1134  BP: (!) 154/62 (!) 149/61 (!) 148/66 124/60  Pulse: 76 77 88 88  Resp: 17 15 18    Temp: 98 F (36.7 C) (!) 97.4 F (36.3 C) (!) 97.5 F (36.4 C)   TempSrc: Oral Oral Oral   SpO2: 93% 90% 91%     Intake/Output Summary (Last 24 hours) at 09/03/2019 1135 Last data filed at 09/03/2019 0606 Gross per  24 hour  Intake 871.7 ml  Output --  Net 871.7 ml   There were no vitals filed for this visit.  Examination:  General exam: Alert, calm, currently appears comfortable, sleeping but arousable Respiratory system: Clear to auscultation. Respiratory effort normal.  Oxygenating well on room air Cardiovascular system: S1 & S2 heard, RRR. No JVD, murmurs, rubs, gallops or clicks. No pedal edema. Gastrointestinal system: Abdomen is nondistended, soft, significant tenderness. No  organomegaly or masses felt.  Slightly hypoactive bowel sounds, but present. Central nervous system: Alert, oriented to place, but not time (20) and situation. No focal neurological deficits. Extremities: Symmetric 5 x 5 power.  Moves all extremities independently Skin: No rashes, lesions or ulcers Psychiatry: Judgement and insight appear poor. Mood & affect appropriate.     Data Reviewed: I have personally reviewed following labs and imaging studies  CBC: Recent Labs  Lab 08/31/19 1300 09/01/19 0449 09/02/19 0654 09/03/19 0628  WBC 18.0* 12.9* 12.6* 8.6  HGB 13.1 11.0* 11.4* 10.2*  HCT 42.7 34.9* 35.6* 31.9*  MCV 100.0 97.8 97.0 96.1  PLT 189 154 186 607   Basic Metabolic Panel: Recent Labs  Lab 08/31/19 1300 09/01/19 0449 09/02/19 0654 09/03/19 0628  NA 137 133* 133* 135  K 3.8 4.1 4.5 2.7*  CL 92* 91* 92* 95*  CO2 28 27 24 27   GLUCOSE 122* 88 90 78  BUN 16 25* 34* 15  CREATININE 4.23* 4.98* 6.34* 3.30*  CALCIUM 8.9 8.4* 8.1* 7.1*  MG  --   --  2.2  --   PHOS  --   --  7.7*  --    GFR: CrCl cannot be calculated (Unknown ideal weight.). Liver Function Tests: Recent Labs  Lab 08/31/19 1300 09/02/19 0654  AST 24  --   ALT 15  --   ALKPHOS 102  --   BILITOT 0.7  --   PROT 6.8  --   ALBUMIN 3.1* 2.4*   Recent Labs  Lab 08/31/19 1300  LIPASE 27   No results for input(s): AMMONIA in the last 168 hours. Coagulation Profile: No results for input(s): INR, PROTIME in the last 168 hours. Cardiac Enzymes: No results for input(s): CKTOTAL, CKMB, CKMBINDEX, TROPONINI in the last 168 hours. BNP (last 3 results) No results for input(s): PROBNP in the last 8760 hours. HbA1C: No results for input(s): HGBA1C in the last 72 hours. CBG: No results for input(s): GLUCAP in the last 168 hours. Lipid Profile: No results for input(s): CHOL, HDL, LDLCALC, TRIG, CHOLHDL, LDLDIRECT in the last 72 hours. Thyroid Function Tests: No results for input(s): TSH, T4TOTAL,  FREET4, T3FREE, THYROIDAB in the last 72 hours. Anemia Panel: Recent Labs    09/03/19 0628  VITAMINB12 579   Sepsis Labs: No results for input(s): PROCALCITON, LATICACIDVEN in the last 168 hours.  Recent Results (from the past 240 hour(s))  SARS CORONAVIRUS 2 (TAT 6-24 HRS) Nasopharyngeal Nasopharyngeal Swab     Status: None   Collection Time: 08/31/19  8:32 PM   Specimen: Nasopharyngeal Swab  Result Value Ref Range Status   SARS Coronavirus 2 NEGATIVE NEGATIVE Final    Comment: (NOTE) SARS-CoV-2 target nucleic acids are NOT DETECTED. The SARS-CoV-2 RNA is generally detectable in upper and lower respiratory specimens during the acute phase of infection. Negative results do not preclude SARS-CoV-2 infection, do not rule out co-infections with other pathogens, and should not be used as the sole basis for treatment or other patient management decisions. Negative results must be  combined with clinical observations, patient history, and epidemiological information. The expected result is Negative. Fact Sheet for Patients: SugarRoll.be Fact Sheet for Healthcare Providers: https://www.woods-mathews.com/ This test is not yet approved or cleared by the [place] FDA and  has been authorized for detection and/or diagnosis of SARS-CoV-2 by FDA under an Emergency Use Authorization (EUA). This EUA will remain  in effect (meaning this test can be used) for the duration of the COVID-19 declaration under Section 56 4(b)(1) of the Act, 21 U.S.C. section 360bbb-3(b)(1), unless the authorization is terminated or revoked sooner. Performed at [facility], [place] [address], [place], [place] [address]          Radiology Studies: No results found.      Scheduled Meds: . amiodarone  200 mg Oral Daily  . aspirin EC  325 mg Oral QPM  . bisacodyl  10 mg Rectal Once  . calcitonin (salmon)  1 spray Alternating Nares Daily  . Chlorhexidine  Gluconate Cloth  6 each Topical Daily  . doxercalciferol      . doxercalciferol  3 mcg Intravenous Q T,Th,Sa-HD  . feeding supplement (PRO-STAT SUGAR FREE 64)  30 mL Oral BID  . fentaNYL  1 patch Transdermal Q72H  . fentaNYL  1 patch Transdermal Q72H  . heparin  5,000 Units Subcutaneous Q8H  . hydrocortisone cream   Topical QID  . isosorbide mononitrate  60 mg Oral Daily  . melatonin  3 mg Oral QHS  . pravastatin  40 mg Oral QHS  . QUEtiapine  25 mg Oral QHS  . senna-docusate  2 tablet Oral BID  . sevelamer carbonate  1,600 mg Oral TID WC  . sodium chloride flush  3 mL Intravenous Once   Continuous Infusions: . cefTRIAXone (ROCEPHIN)  IV 2 g (09/02/19 2013)  . metronidazole 500 mg (09/02/19 2148)     LOS: 2 days    Time spent: 38 minutes spent on chart review, discussion with nursing staff, consultants, updating family and interview/physical exam; more than 50% of that time was spent in counseling and/or coordination of care.    Bina Veenstra J [place] DO [facility] Available via Epic secure chat 7am-7pm After these hours, please refer to coverage provider listed on [email or web address] 09/03/2019, 11:35 AM

## 2019-09-03 NOTE — Progress Notes (Signed)
Pt had expiratory wheezing. Pt repositioned and rechecked 15 minutes late and wheezing decreased. Offered to do a breathing treatment, but daughter Valerie Terrell asked to let her mom sleep and if it gets worse we can do a treatment. Pt stable prior to going down for dialysis.

## 2019-09-03 NOTE — Plan of Care (Signed)

## 2019-09-03 NOTE — Progress Notes (Signed)
Palliative Medicine Inpatient Follow Up Note   HPI: Per intake H&P --> Valerie Bennettis a 86 y.o.femalewithhistory of ESRD on hemodialysis on Tuesday Thursday Saturdays who was recently admitted for fluid overload and discharged on August 17, 2019 presents to the ER because of abdominal pain. Patient states she has been having abdominal pain generalized for the last 1 week which has been gradually worsening. Over the last 2 days patient also had some nausea vomiting. Denies any blood in the vomitus. Denies any fall or trauma.   Palliative care was asked to help with goals of care conversations and aid in symptom management.   Today's Discussion (07/06/2019): Chart reviewed. Spoke to bedside nursing staff who endorsed that patient had been taken to HD at 3:30 am. Per Nursing the patient had gotten more rest in the setting of seroquel.  I later in the morning checked in on [place], she was pretty somnolent though she had not slept since dialysis. Per review of notes she was able to work with OT and SNF has been recommended.   I was able to meet with her daughter, Valerie Terrell at bedside. We talked at length about caring for [facility] and the trials associate with it. I offered that it can sometimes be helpful to speak with another source such as a counselor about how overwhelmed we may feel as primary caregivers. Validated that Valerie Terrell is truly doing a wonderful job caring for her mother.   Discussed the importance of continued conversation with family and their  medical providers regarding overall plan of care and treatment options, ensuring decisions are within the context of the patients values and GOCs.  Questions and concerns addressed   Vital Signs Vitals:   09/02/19 1932 09/03/19 0847  BP: (!) 149/61 (!) 148/66  Pulse: 77 88  Resp: 15 18  Temp: (!) 97.4 F (36.3 C) (!) 97.5 F (36.4 C)  SpO2: 90% 91%    Intake/Output Summary (Last 24 hours) at 09/03/2019 1025 Last data filed at  09/03/2019 0606 Gross per 24 hour  Intake 991.7 ml  Output --  Net 991.7 ml   SUMMARY OF RECOMMENDATIONS   DNAR/DNI  Ongoing GOC conversations  Social Work C/S for [employer] support  Resumption of Clinical biochemist consult  Code Status/Advance Care Planning:  DNR  Symptom Management:  Lower Back pain:                 - Increase fentanyl to 81mcg QDay                 - Continue calcitriol 1 spray alternating nares daily                 - Continue oxycodone 10-20mg  PO Q6H PRN                 - Would consider short course of decadron if no improvement  Pruritis:                 - Hydrocortisone 1% cream QID  Constipation:                 - Senna 2 tabs PO BID                 - Miralax 17g PO QDay PRN                 - Bisacodyl 10mg  PR   Insomnia:                 -  Increase Seroquel to 25mg  Po QHS                 - Continue melatonin 3mg   Nausea:                 - Continue zofran 4mg  PO Q6H PRN  Muscular Weakness:                 - PT/OT consults  Delirium: - Delirium precautions - Get up during the day - Encourage a familiar face to remain present throughout the day - Keep blinds open and lights on during daylight hours - Minimize the use of opioids/benzodiazepines  Spiritual: - Chaplain consult  Need for emotional support:                 - Utilize therapeutic listening                 - Ensure safe space to express concerns  Time Spent: 35 Greater than 50% of the time was spent in counseling and coordination of care ______________________________________________________________________________________ [facility] Team Team Cell Phone: [phone] Please utilize secure chat with additional questions, if there is no response within 30 minutes please call the above phone number  Palliative Medicine  Team providers are available by phone from 7am to 7pm daily and can be reached through the team cell phone.  Should this patient require assistance outside of these hours, please call the patient's attending physician.

## 2019-09-03 NOTE — Progress Notes (Addendum)
[facility] Progress Note   Subjective:  Seen at end of dialysis treatment this morning - she has been moaning in pain "my hand hurts." Ended up cutting her HD treatment short by 20 minutes. Denies CP, dyspnea. BP dropped during HD - net UF will be ~1L.   Labs from this morning were accidentally drawn > 1hr INTO dialysis. Will disregard low K for now - plan to repeat in about 2 hours from NOW (she is just finishing HD) - if still low at that time, can supplement.  Objective Vitals:   09/02/19 0727 09/02/19 0840 09/02/19 1431 09/02/19 1932  BP: (!) 162/61  (!) 154/62 (!) 149/61  Pulse: 86  76 77  Resp: 15 19 17 15   Temp: 98.1 F (36.7 C)  98 F (36.7 C) (!) 97.4 F (36.3 C)  TempSrc: Oral  Oral Oral  SpO2: (!) 83% 92% 93% 90%   Physical Exam General: Frail woman, mild distress/moaning at this time - hard to redirect Heart: RRR; 3/6 murmur Lungs: CTAB Abdomen: soft, non-tender Extremities: Trace RLE edema, no LLE edema Dialysis Access: R AVG + bruit  Additional Objective Labs: Basic Metabolic Panel: Recent Labs  Lab 09/01/19 0449 09/02/19 0654 09/03/19 0628  NA 133* 133* 135  K 4.1 4.5 2.7*  CL 91* 92* 95*  CO2 27 24 27   GLUCOSE 88 90 78  BUN 25* 34* 15  CREATININE 4.98* 6.34* 3.30*  CALCIUM 8.4* 8.1* 7.1*  PHOS  --  7.7*  --    Liver Function Tests: Recent Labs  Lab 08/31/19 1300 09/02/19 0654  AST 24  --   ALT 15  --   ALKPHOS 102  --   BILITOT 0.7  --   PROT 6.8  --   ALBUMIN 3.1* 2.4*   Recent Labs  Lab 08/31/19 1300  LIPASE 27   CBC: Recent Labs  Lab 08/31/19 1300 08/31/19 1300 09/01/19 0449 09/02/19 0654 09/03/19 0628  WBC 18.0*   < > 12.9* 12.6* 8.6  HGB 13.1   < > 11.0* 11.4* 10.2*  HCT 42.7   < > 34.9* 35.6* 31.9*  MCV 100.0  --  97.8 97.0 96.1  PLT 189   < > 154 186 177   < > = values in this interval not displayed.   Medications: . cefTRIAXone (ROCEPHIN)  IV 2 g (09/02/19 2013)  . metronidazole 500 mg (09/02/19  2148)   . amiodarone  200 mg Oral Daily  . aspirin EC  325 mg Oral QPM  . bisacodyl  10 mg Rectal Once  . calcitonin (salmon)  1 spray Alternating Nares Daily  . Chlorhexidine Gluconate Cloth  6 each Topical Daily  . doxercalciferol      . doxercalciferol  3 mcg Intravenous Q T,Th,Sa-HD  . feeding supplement (PRO-STAT SUGAR FREE 64)  30 mL Oral BID  . fentaNYL  1 patch Transdermal Q72H  . fentaNYL  1 patch Transdermal Q72H  . heparin  5,000 Units Subcutaneous Q8H  . hydrocortisone cream   Topical QID  . isosorbide mononitrate  60 mg Oral Daily  . melatonin  3 mg Oral QHS  . pravastatin  40 mg Oral QHS  . QUEtiapine  25 mg Oral QHS  . senna-docusate  2 tablet Oral BID  . sevelamer carbonate  1,600 mg Oral TID WC  . sodium chloride flush  3 mL Intravenous Once    Dialysis Orders: MTTS at [facility] 4hr, 350/600, EDW 51kg, 2K/2Ca, AVG, heparin 1400 bolus -  Hectoral 44mcg IV q HD - No ESA, Hgb > 11  Assessment/Plan: 1. Acute sigmoid diverticulitis: On Ceftriaxone + Flagyl.  2. Back pain: New T12 compression Fx on imaging. Pain control per primary. On nasal calcitonin - watch Ca levels. IR consulted for T12 kyphoplasty. 3. ESRD:MTTS schedule recently - missed last HD. HD completed early Sun morning - finishing now. Volume stable - can plan for 3d/week while here -> next HD Tues 4/20. See above. Labs this morning accidentally drawn DURING dialysis - will order repeat in 2 hours. 4. Hypertension/volume:BP variable - dropped on HD.  5. Anemia:Hgb 10.2 - follow without ESA for now. 6. Metabolic bone disease:Ca ok, Phos high. Continue home binders and VDRA. 7. Nutrition:Alb low - will add pro-stat. 8. COPD 9. Confusion/delirium: ?related to pain medications. Defer to primary team. 10. GOC: Appreciate palliative care consult. Now DNR. Overall decline in mental and functional status at home. Ongoing discussions about dialysis.  [name], PA-C 09/03/2019, 8:13 AM  [facility]  I have seen and examined this patient and agree with plan and assessment in the above note with renal recommendations/intervention highlighted.  Still having issues with pain and confusion.  She is off schedule due to high census. Will plan for next session on Tuesday 09/05/19 and can probably tolerate 3 HD sessions while hospitalized due to poor po intake.  Broadus John A Sharaya Boruff,MD 09/03/2019 11:39 AM

## 2019-09-04 DIAGNOSIS — Z992 Dependence on renal dialysis: Secondary | ICD-10-CM

## 2019-09-04 DIAGNOSIS — Z515 Encounter for palliative care: Secondary | ICD-10-CM

## 2019-09-04 DIAGNOSIS — Z66 Do not resuscitate: Secondary | ICD-10-CM

## 2019-09-04 DIAGNOSIS — N186 End stage renal disease: Secondary | ICD-10-CM

## 2019-09-04 DIAGNOSIS — S22080A Wedge compression fracture of T11-T12 vertebra, initial encounter for closed fracture: Secondary | ICD-10-CM

## 2019-09-04 LAB — BASIC METABOLIC PANEL
Anion gap: 17 — ABNORMAL HIGH (ref 5–15)
BUN: 15 mg/dL (ref 8–23)
CO2: 25 mmol/L (ref 22–32)
Calcium: 7.7 mg/dL — ABNORMAL LOW (ref 8.9–10.3)
Chloride: 94 mmol/L — ABNORMAL LOW (ref 98–111)
Creatinine, Ser: 3.93 mg/dL — ABNORMAL HIGH (ref 0.44–1.00)
GFR calc Af Amer: 11 mL/min — ABNORMAL LOW (ref >60)
GFR calc non Af Amer: 10 mL/min — ABNORMAL LOW (ref >60)
Glucose, Bld: 73 mg/dL (ref 70–99)
Potassium: 3.7 mmol/L (ref 3.5–5.1)
Sodium: 136 mmol/L (ref 135–145)

## 2019-09-04 LAB — CBC
HCT: 31.3 % — ABNORMAL LOW (ref 36.0–46.0)
Hemoglobin: 10 g/dL — ABNORMAL LOW (ref 12.0–15.0)
MCH: 31.4 pg (ref 26.0–34.0)
MCHC: 31.9 g/dL (ref 30.0–36.0)
MCV: 98.4 fL (ref 80.0–100.0)
Platelets: 208 10*3/uL (ref 150–400)
RBC: 3.18 MIL/uL — ABNORMAL LOW (ref 3.87–5.11)
RDW: 15.8 % — ABNORMAL HIGH (ref 11.5–15.5)
WBC: 7 10*3/uL (ref 4.0–10.5)
nRBC: 0 % (ref 0.0–0.2)

## 2019-09-04 LAB — MAGNESIUM: Magnesium: 1.9 mg/dL (ref 1.7–2.4)

## 2019-09-04 MED ORDER — BISACODYL 10 MG RE SUPP
10.0000 mg | Freq: Once | RECTAL | Status: AC
  Administered 2019-09-04: 10:00:00 10 mg via RECTAL
  Filled 2019-09-04: qty 1

## 2019-09-04 NOTE — Plan of Care (Signed)

## 2019-09-04 NOTE — Plan of Care (Signed)

## 2019-09-04 NOTE — Progress Notes (Signed)
PROGRESS NOTE    Valerie Terrell  XLK:[id number] DOB: 09-20-1932 DOA: 08/31/2019 PCP: Tammi Sou, MD    Brief Narrative:  Valerie Terrell is a 86 y.o. female with history of ESRD on hemodialysis on Tuesday Thursday Saturdays who was recently admitted for fluid overload and discharged on August 17, 2019 presents to the ER because of abdominal pain.  Patient states she has been having abdominal pain generalized for the last 1 week which has been gradually worsening.  Over the last 2 days patient also had some nausea vomiting.  Denies any blood in the vomitus.  Denies any fall or trauma.  States she does not miss her dialysis.  Denies chest pain or shortness of breath.  No fever chills.  In the ER CT scan of the abdomen pelvis shows diverticulitis of sigmoid colon and also a new T12 compression fracture.  Patient is able to move extremities and denies any incontinence of urine or bowel.  Labs show WBC count of 18 potassium 3.8 creatinine 4.2 LFTs normal except for albumin of 3.1.  EKG shows normal sinus rhythm.  Covid test was negative.  On exam abdomen appears benign. Patient is able to move all extremities.  Patient was started on ceftriaxone and Flagyl for diverticulitis.   Assessment & Plan:   Principal Problem:   Diverticulitis Active Problems:   PAF (paroxysmal atrial fibrillation) (HCC)   Essential hypertension   History of CVA (cerebrovascular accident)   Cardiac pacemaker   Non-ischemic cardiomyopathy ([place])   ESRD (end stage renal disease) ([place])   T12 compression fracture ([place])   Acute diverticulitis   Palliative care by specialist   Goals of care, counseling/discussion   DNR (do not resuscitate)   Inadequate pain control   Acute sigmoid diverticulitis Patient presenting with progressive abdominal pain over the past week.  Patient is afebrile with a leukocytosis of 18.0.  CT abdomen/pelvis notable for slight differential wall thickening sigmoid colon with pericolonic fat  stranding consistent with acute diverticulitis. --WBC 18.0-->12.9-->12.6-->8.6-->7.0 --Continue antibiotics with ceftriaxone and Flagyl --Clear liquid diet; advance as tolerates; waiting SLP evaluation --Supportive care, antiemetics prn  New T12 compression fracture with intractable pain Hx of chronic pain CT abdomen/pelvis notable for T12 compression fracture which appears new with less than 25% height loss.  Patient with history of chronic pain, now with intractable low back pain. --Telemetry care following and assisting with pain management, appreciate assistance --IR consulted to evaluate for kyphoplasty, plan next week once acute infectious process as above has been controlled -- fentanyl patch increased to 62 mcg q72h --Continue home oxycodone IR 10-20mg  q6h prn --Calcitriol 1 spray nasal daily, closely monitoring calcium levels daily --Continue PT/OT efforts, currently recommending SNF placement, patient/daughter reluctant, but will agree to have social work send out referrals in case patient does not improve for the next few days.  ESRD on HD MTTS Recently admitted and discharged on August 17, 2019 for volume overload.  Now receives hemodialysis 4 days/week.  Dialyzes at [facility] dialysis center.  Missed dialysis on 08/31/2019.   --Nephrology following, appreciate assistance --Electrolytes stable --Continue HD, next session planned for tomorrow --Continue to monitor electrolytes  History of tachybrady syndrome s/p PPM Paroxysmal atrial fibrillation Currently rate controlled --Continue amiodarone 200 mg p.o. daily --Not on chronic anticoagulation secondary to frequent falls --Discontinued telemetry 4/17  HLD: Continue pravastatin 40 mg p.o. daily  History of nonischemic cardiomyopathy Essential hypertension TTE 11/2017 with EF 55-60% (improved from 35% prior), grade 2 diastolic dysfunction, mild/moderate MR, LA  moderately dilated, mild TR --Continue Imdur 60 mg p.o. daily    Confusion with agitation Patient with confusion and agitation, possible underlying dementia coupled with acute hospital-acquired delirium in the setting of infectious process as above. --Seroquel 25 mg p.o. nightly --Melatonin 3 mg p.o. daily  Constipation --Senokot 2 tabs twice daily --bisacodyl 10mg  supp today --Miralax prn daily  Sacral stage I pressure ulcer, present on admission --Continue local wound care, offloading frequently  Pressure Injury 08/16/19 Sacrum Medial Stage 1 -  Intact skin with non-blanchable redness of a localized area usually over a bony prominence. (Active)  08/16/19 1917  Location: Sacrum  Location Orientation: Medial  Staging: Stage 1 -  Intact skin with non-blanchable redness of a localized area usually over a bony prominence.  Wound Description (Comments):   Present on Admission: Yes    Ethics Patient now with 2 hospitalization over the past month, initially hospitalized for volume overload, now goes outpatient dialysis 4 times weekly which is fairly new.  Now readmitted with acute sigmoid diverticulitis with subacute T12 fracture.  Followed very recently by [facility] palliative care outpatient. Daughter slightly emotional this morning regarding life expectancy of her mother, she seems to be having a very hard time managing her alone with very little family support as other family members reside in [place] and have their own chronic medical conditions.  Palliative care following for assistance in goals of care and medical decision making.  CODE STATUS now changed to DNR.  Do have concerns regarding her advanced age, HD dependency and continued confusion, suspect some underlying dementia which is exacerbated by recurrent hospitalizations with acute delirium.   DVT prophylaxis: Heparin Code Status: DNR Family Communication: Updated daughter who is present at bedside this morning  Disposition Plan:  Status is: Inpatient  Remains inpatient appropriate  because:Ongoing active pain requiring inpatient pain management, Ongoing diagnostic testing needed not appropriate for outpatient work up, Unsafe d/c plan and Inpatient level of care appropriate due to severity of illness pending IR kyphoplasty   Dispo: The patient is from: Home              Anticipated d/c is to: Home vs SNF              Anticipated d/c date is: 3 days              Patient currently is not medically stable to d/c.    Consultants:   IR  Procedures:   T12 kyphoplasty: Pending  Antimicrobials:   Ceftriaxone 4/15>>  Flagyl 4/15>>   Subjective: Patient seen and examined at bedside this morning, resting comfortably.  Appears to be in good spirits this morning.  She is somewhat confused, but improved mentation in comparison to yesterday.  Daughter is at bedside.  She reports her abdominal discomfort has resolved.  Stating that she is currently tolerating her clear liquid diet and wishes for further advancement.  Nursing concerned yesterday evening about possible swallowing issues/aspiration, awaiting speech therapy evaluation.  Patient also with slight agitation overnight, pulling out IV catheter. Pending kyphoplasty to T12 vertebral segments, awaiting timing per IR.  No other complaints or concerns at this time.  Denies headache, no fever/chills/night sweats, no nausea/vomiting diarrhea, no chest pain, no palpitations, no shortness of breath.  No other acute events overnight per nursing staff.  Objective: Vitals:   09/03/19 1928 09/04/19 0300 09/04/19 0457 09/04/19 0741  BP: (!) 121/97 (!) 126/54  (!) 109/49  Pulse: 75 79  77  Resp: 16 15  17  Temp: 98.1 F (36.7 C) 97.8 F (36.6 C)  97.7 F (36.5 C)  TempSrc: Oral Oral  Oral  SpO2: 95% 91%  98%  Weight:   52 kg   Height:   4\' 2"  (1.27 m)     Intake/Output Summary (Last 24 hours) at 09/04/2019 1057 Last data filed at 09/04/2019 0900 Gross per 24 hour  Intake 717 ml  Output --  Net 717 ml   Filed Weights    09/04/19 0457  Weight: 52 kg    Examination:  General exam: Alert, calm, currently appears comfortable Respiratory system: Clear to auscultation without wheezing/crackles. Respiratory effort normal.  On 3 L nasal cannula oxygenating 91% Cardiovascular system: S1 & S2 heard, RRR. No JVD, murmurs, rubs, gallops or clicks. No pedal edema. Gastrointestinal system: Abdomen is nondistended, soft, nontender to palpation. No organomegaly or masses felt.  Normal active bowel sounds. Central nervous system: Alert, oriented to place, person, but not time (2020) and situation. No focal neurological deficits. Extremities: Symmetric 5 x 5 power.  Moves all extremities independently Skin: No rashes, lesions or ulcers, chronic venous stasis changes bilateral lower extremities Psychiatry: Judgement and insight appear poor. Mood & affect appropriate.     Data Reviewed: I have personally reviewed following labs and imaging studies  CBC: Recent Labs  Lab 09/01/19 0449 09/02/19 0654 09/03/19 0628 09/03/19 1136 09/04/19 0454  WBC 12.9* 12.6* 8.6 11.0* 7.0  HGB 11.0* 11.4* 10.2* 11.3* 10.0*  HCT 34.9* 35.6* 31.9* 35.1* 31.3*  MCV 97.8 97.0 96.1 95.1 98.4  PLT 154 186 177 188 161   Basic Metabolic Panel: Recent Labs  Lab 09/01/19 0449 09/02/19 0654 09/03/19 0628 09/03/19 1136 09/04/19 0454  NA 133* 133* 135 138 136  K 4.1 4.5 2.7* 3.8 3.7  CL 91* 92* 95* 96* 94*  CO2 27 24 27 26 25   GLUCOSE 88 90 78 78 73  BUN 25* 34* 15 8 15   CREATININE 4.98* 6.34* 3.30* 2.96* 3.93*  CALCIUM 8.4* 8.1* 7.1* 7.6* 7.7*  MG  --  2.2  --   --  1.9  PHOS  --  7.7*  --  4.1  --    GFR: Estimated Creatinine Clearance: 5.8 mL/min (A) (by C-G formula based on SCr of 3.93 mg/dL (H)). Liver Function Tests: Recent Labs  Lab 08/31/19 1300 09/02/19 0654 09/03/19 1136  AST 24  --   --   ALT 15  --   --   ALKPHOS 102  --   --   BILITOT 0.7  --   --   PROT 6.8  --   --   ALBUMIN 3.1* 2.4* 2.4*   Recent Labs   Lab 08/31/19 1300  LIPASE 27   No results for input(s): AMMONIA in the last 168 hours. Coagulation Profile: No results for input(s): INR, PROTIME in the last 168 hours. Cardiac Enzymes: No results for input(s): CKTOTAL, CKMB, CKMBINDEX, TROPONINI in the last 168 hours. BNP (last 3 results) No results for input(s): PROBNP in the last 8760 hours. HbA1C: No results for input(s): HGBA1C in the last 72 hours. CBG: No results for input(s): GLUCAP in the last 168 hours. Lipid Profile: No results for input(s): CHOL, HDL, LDLCALC, TRIG, CHOLHDL, LDLDIRECT in the last 72 hours. Thyroid Function Tests: No results for input(s): TSH, T4TOTAL, FREET4, T3FREE, THYROIDAB in the last 72 hours. Anemia Panel: Recent Labs    09/03/19 0628  VITAMINB12 579   Sepsis Labs: No results for input(s): PROCALCITON, LATICACIDVEN  in the last 168 hours.  Recent Results (from the past 240 hour(s))  SARS CORONAVIRUS 2 (TAT 6-24 HRS) Nasopharyngeal Nasopharyngeal Swab     Status: None   Collection Time: 08/31/19  8:32 PM   Specimen: Nasopharyngeal Swab  Result Value Ref Range Status   SARS Coronavirus 2 NEGATIVE NEGATIVE Final    Comment: (NOTE) SARS-CoV-2 target nucleic acids are NOT DETECTED. The SARS-CoV-2 RNA is generally detectable in upper and lower respiratory specimens during the acute phase of infection. Negative results do not preclude SARS-CoV-2 infection, do not rule out co-infections with other pathogens, and should not be used as the sole basis for treatment or other patient management decisions. Negative results must be combined with clinical observations, patient history, and epidemiological information. The expected result is Negative. Fact Sheet for Patients: SugarRoll.be Fact Sheet for Healthcare Providers: https://www.woods-mathews.com/ This test is not yet approved or cleared by the [place] FDA and  has been authorized for detection  and/or diagnosis of SARS-CoV-2 by FDA under an Emergency Use Authorization (EUA). This EUA will remain  in effect (meaning this test can be used) for the duration of the COVID-19 declaration under Section 56 4(b)(1) of the Act, 21 U.S.C. section 360bbb-3(b)(1), unless the authorization is terminated or revoked sooner. Performed at [facility], [place] [address], [place], [place] [address]          Radiology Studies: No results found.      Scheduled Meds: . amiodarone  200 mg Oral Daily  . aspirin EC  325 mg Oral QPM  . calcitonin (salmon)  1 spray Alternating Nares Daily  . Chlorhexidine Gluconate Cloth  6 each Topical Daily  . doxercalciferol  3 mcg Intravenous Q T,Th,Sa-HD  . feeding supplement (PRO-STAT SUGAR FREE 64)  30 mL Oral BID  . fentaNYL  1 patch Transdermal Q72H  . fentaNYL  1 patch Transdermal Q72H  . heparin  5,000 Units Subcutaneous Q8H  . hydrocortisone cream   Topical QID  . isosorbide mononitrate  60 mg Oral Daily  . melatonin  3 mg Oral QHS  . pravastatin  40 mg Oral QHS  . QUEtiapine  25 mg Oral QHS  . senna-docusate  2 tablet Oral BID  . sevelamer carbonate  1,600 mg Oral TID WC  . sodium chloride flush  3 mL Intravenous Once   Continuous Infusions: . cefTRIAXone (ROCEPHIN)  IV 2 g (09/03/19 2240)  . metronidazole 500 mg (09/04/19 0805)     LOS: 3 days    Time spent: 36 minutes spent on chart review, discussion with nursing staff, consultants, updating family and interview/physical exam; more than 50% of that time was spent in counseling and/or coordination of care.    Pernella Ackerley J [place] DO [facility] Available via Epic secure chat 7am-7pm After these hours, please refer to coverage provider listed on [email or web address] 09/04/2019, 10:57 AM

## 2019-09-04 NOTE — Progress Notes (Signed)
Physical Therapy Treatment Patient Details Name: Valerie Terrell MRN: [id number] DOB: 10/14/1932 Today's Date: 09/04/2019    History of Present Illness  Valerie Terrell is a 86 y.o. female with history of ESRD on hemodialysis on Tuesday Thursday Saturdays who was recently admitted for fluid overload and discharged on August 17, 2019 presents to the ER because of abdominal pain. CT showed diverticulitis of sigmoid colon and new T12 compression fracture. Started on ceftriaxone and Flagyl for diverticulitis.     PT Comments    Pt supine in bed sleeping soundly this afternoon.  Required increased time to wake patient this pm.  She appears internally distracted and follows commands intermittently.  She required mod -max assistance to mobilize. Continue to recommend snf placement at d/c.      Follow Up Recommendations  SNF;Supervision/Assistance - 24 hour     Equipment Recommendations  Wheelchair (measurements PT);Wheelchair cushion (measurements PT)    Recommendations for Other Services       Precautions / Restrictions Precautions Precautions: Fall Restrictions Weight Bearing Restrictions: No    Mobility  Bed Mobility Overal bed mobility: Needs Assistance Bed Mobility: Supine to Sit Rolling: Mod assist Sidelying to sit: Mod assist       General bed mobility comments: Pt required cues for rolling. LE advancement and trunk elevation into a seated position.  Pt required assistance to boost to edge of bed with bed pad.  Guarded due to back paing.  Transfers Overall transfer level: Needs assistance Equipment used: Rolling walker (2 wheeled) Transfers: Sit to/from Stand Sit to Stand: Mod assist         General transfer comment: Cues for hand placement to and from seated surface,  Pt required heavy moderate assistance to boost into standing.  Ambulation/Gait Ambulation/Gait assistance: Max assist Gait Distance (Feet): 6 Feet Assistive device: Rolling walker (2  wheeled)(youth) Gait Pattern/deviations: Step-to pattern;Shuffle;Trunk flexed     General Gait Details: Pt performed 4 ft forward and required increased assistance to turn and back to seated surface.   Stairs             Wheelchair Mobility    Modified Rankin (Stroke Patients Only)       Balance Overall balance assessment: Needs assistance Sitting-balance support: No upper extremity supported;Feet supported Sitting balance-Leahy Scale: Zero Sitting balance - Comments: listing to right without attempt to correct     Standing balance-Leahy Scale: Poor                              Cognition Arousal/Alertness: Lethargic Behavior During Therapy: Flat affect Overall Cognitive Status: Impaired/Different from baseline                                 General Comments: Pt with long pauses between instruction but does not state why, she denies dizziness.  She appears internally distracted.      Exercises      General Comments        Pertinent Vitals/Pain Pain Assessment: Faces Faces Pain Scale: Hurts even more Pain Location: back Pain Descriptors / Indicators: Grimacing;Guarding;Moaning Pain Intervention(s): Monitored during session;Repositioned    Home Living                      Prior Function            PT Goals (current goals can now be found in  the care plan section) Acute Rehab PT Goals Patient Stated Goal: unable to state (lethargy) Potential to Achieve Goals: Fair Progress towards PT goals: Progressing toward goals    Frequency    Min 3X/week      PT Plan Current plan remains appropriate    Co-evaluation              AM-PAC PT "6 Clicks" Mobility   Outcome Measure  Help needed turning from your back to your side while in a flat bed without using bedrails?: A Lot Help needed moving from lying on your back to sitting on the side of a flat bed without using bedrails?: A Lot Help needed moving to and  from a bed to a chair (including a wheelchair)?: Total Help needed standing up from a chair using your arms (e.g., wheelchair or bedside chair)?: A Lot Help needed to walk in hospital room?: Total Help needed climbing 3-5 steps with a railing? : Total 6 Click Score: 9    End of Session Equipment Utilized During Treatment: Gait belt Activity Tolerance: Patient limited by pain Patient left: in bed;with call bell/phone within reach;with bed alarm set Nurse Communication: Mobility status PT Visit Diagnosis: Unsteadiness on feet (R26.81);Other abnormalities of gait and mobility (R26.89);Pain;Difficulty in walking, not elsewhere classified (R26.2) Pain - part of body: (back, L flank)     Time: 7001-7494 PT Time Calculation (min) (ACUTE ONLY): 20 min  Charges:  $Therapeutic Activity: 8-22 mins                     Erasmo Leventhal , PTA Acute Rehabilitation Services Pager [phone] Office [phone]     [place] Lozoya Eli Hose 09/04/2019, 6:00 PM

## 2019-09-04 NOTE — Evaluation (Signed)
Clinical/Bedside Swallow Evaluation Patient Details  Name: Valerie Terrell MRN: [id number] Date of Birth: 31-Mar-1933  Today's Date: 09/04/2019 Time: SLP Start Time (ACUTE ONLY): 69 SLP Stop Time (ACUTE ONLY): 1100 SLP Time Calculation (min) (ACUTE ONLY): 10 min  Past Medical History:  Past Medical History:  Diagnosis Date  . Anemia of chronic disease 2018   + anemia of CRI?  [name] Atrophy of left kidney    with absent blood flow by renal artery dopplers (Dr. Gwenlyn Found)  . 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 Chronic combined systolic and diastolic CHF (congestive heart failure) ([place])    a. 11/2016: echo showing EF of 35-40%, RV strain noted, mild MR and mild TR.   Echo showed much improved EF 12/2017 (55-60%)  . Chronic respiratory failure with hypoxia ([place]) 02/11/2017   Now on chronic O2 (intermittent as of summer 2019).    . Closed fracture of right superior pubic ramus ([place]) 07/18/2018   Non op mgmt (Dr. Victorino December was ortho who saw her in hosp but i don't think pt followed up with him).  . Closed right hip fracture ([place]) 10/04/2018   ORIF  . Debilitated patient    WC dependent as of 2018.  Hosp bed + full assistance with most ADLs s/p hip fx surg 09/2018.  [name] ESRD on hemodialysis ([place]) 01/2017   T/Th/Sat schedule- [place].  Right basilic AV fistula 36/6440.  Graft 2019.  [name] History of adenomatous polyp of colon   . History of kidney stones   . History of subarachnoid hemorrhage 10/2014   after syncope and while on xarelto  . Hyperlipidemia   . Hypertension    Difficult to control, in the setting of one functioning kidney: pt was referred to nephrology by Dr. Gwenlyn Found 06/2015.  . Lumbar radiculopathy 2012  . Lumbar spinal stenosis 2019   facet injections only very short term relief.  L1 and L2 selective nerve root blocks helpful 04/2018.  . Lumbar spondylosis    MR 07/2016---no sign of spinal nerve compression or cord compression.  Pt set up with outpt ortho while  admitted to hosp 07/2016.  . Malnourished ([place])   . Metatarsal fracture 06/10/2016   Nondisplaced, left 5th metatarsal--pt was referred to ortho  . Osteopenia 2014   T-score -2.1  . PAF (paroxysmal atrial fibrillation) (HCC)    Eliquis started after BRAO and CVA.  She was changed to warfarin 2018. All anticoag stopped due to recurrent falls.  . Presence of permanent cardiac pacemaker   . Right rib fracture 12/2016   s/p fall  . Sick sinus syndrome ([place]) 11/2016   Dual chamber pacer insertion 2018 (Dr. Lovena Le)  . Stroke ([place]) 34/7425   cardioembolic (had CVA while on no anticoag)--"scattered subacute punctate infarcts: 1 in R parietal lobe and 2 in occipital cortex" on MRI br.  CT angio head/neck: aortic arch athero.  R ICA 20% stenosis, L ICA w/out any stenosis.  . Thoracic back pain 01/2017; 01/2018   2018: Facet?  Dr. Ramos->steroid injection.  01/2018-->CT T spine to check for a new comp fx-->none found.  Selective nerve root inj in T spine helpful on R side.   . Thoracic compression fracture ([place]) 07/2016   T3.  T7 and T8-- T8 kyphoplasty during hosp admission 07/2016.  Neuro referred pt to pain mgmt for consideration of injection 09/2016.  I referred her to endo 08/2016 for consideration of calcitonin treatment.   Past Surgical History:  Past Surgical History:  Procedure Laterality Date  . APPENDECTOMY  child  . AV FISTULA PLACEMENT Right 03/31/2017   Procedure: Right ARM Basilic vein transposition;  Surgeon: Conrad Macdoel, MD;  Location: [facility];  Service: Vascular;  Laterality: Right;  . AV FISTULA PLACEMENT Right 12/06/2017   Procedure: INSERTION OF ARTERIOVENOUS (AV) GORE-TEX GRAFT ARM RIGHT UPPER EXTREMITY;  Surgeon: [name], MD;  Location: [facility];  Service: Vascular;  Laterality: Right;  . CARDIOVASCULAR STRESS TEST  01/31/2016   Stress myoview: NORMAL/Low risk.  EF 56%.  . [place]  . COLONOSCOPY  2015   + hx of adenomatous polyps.  Need digest health spec in  [place] records to see when pt due for next colonoscopy  . DECLOT CKV Right 03/21/2019  . EYE SURGERY Bilateral    Catarct  . INSERTION OF DIALYSIS CATHETER Right 01/29/2017   Procedure: INSERTION OF DIALYSIS CATHETER- RIGHT INTERNAL JUGULAR;  Surgeon: [name], MD;  Location: [facility];  Service: Vascular;  Laterality: Right;  . INTRAMEDULLARY (IM) NAIL INTERTROCHANTERIC Right 10/04/2018   Procedure: INTRAMEDULLARY (IM) NAIL INTERTROCHANTRIC;  Surgeon: Nicholes Stairs, MD;  Location: [facility];  Service: Orthopedics;  Laterality: Right;  . IR GENERIC HISTORICAL  07/24/2016   IR KYPHO THORACIC WITH BONE BIOPSY 07/24/2016 Luanne Bras, MD [facility]  . KYPHOPLASTY  07/27/2016   T8  . PACEMAKER IMPLANT N/A 12/03/2016   Procedure: Pacemaker Implant;  Surgeon: Evans Lance, MD;  Location: [facility];  Service: Cardiovascular;  Laterality: N/A;  . Renal artery dopplers  02/26/2016   Her right renal dimension was 11 cm pole to pole with mild to moderate right renal artery stenosis. A right renal aortic ratio was 3.22 suggesting less than a 50% stenosis.  . Right upper arm AV Gore-Tex graft  12/06/2017   Dr. Donnetta Hutching  . TONSILLECTOMY    . TRANSTHORACIC ECHOCARDIOGRAM  01/29/2016; 11/2016; 12/2017   2017: EF 55-60%, normal LV wall motion, grade I DD.  No cardiac source of emboli was seen.  2018: EF 35-40%, could not assess DD due to a-fib, pulm HTN noted. 12/2017 EF 55-60%, nl wall motion, grd II DD, mod MR and pulm regurg, increased pulm pressure (peak 52).   HPI:  Valerie Terrell is a 86 y.o. female with history of ESRD on hemodialysis on Tuesday Thursday Saturdays who was recently admitted for fluid overload and discharged on August 17, 2019 presents to the ER because of abdominal pain.  In the ER CT scan of the abdomen pelvis shows diverticulitis of sigmoid colon and also a new T12 compression fracture.    Assessment / Plan / Recommendation Clinical Impression  Pt demonstrates normal  swallow function without signs of aspiration. She is able to complete a snack of butter cookies, applesauce and 8 oz of water without difficulty. Reports she avoids very chewy foods due to dental issues, but can eat most things if they are cut up well. No need for texture modificaiton at this time, will sign off.  SLP Visit Diagnosis: Dysphagia, oral phase (R13.11)    Aspiration Risk  Mild aspiration risk    Diet Recommendation Regular;Thin liquid   Liquid Administration via: Cup;Straw Medication Administration: Whole meds with liquid Supervision: Patient able to self feed    Other  Recommendations     Follow up Recommendations None      Frequency and Duration            Prognosis  Swallow Study   General HPI: Der Gagliano is a 86 y.o. female with history of ESRD on hemodialysis on Tuesday Thursday Saturdays who was recently admitted for fluid overload and discharged on August 17, 2019 presents to the ER because of abdominal pain.  In the ER CT scan of the abdomen pelvis shows diverticulitis of sigmoid colon and also a new T12 compression fracture.  Type of Study: Bedside Swallow Evaluation Previous Swallow Assessment: none Diet Prior to this Study: NPO Temperature Spikes Noted: No Respiratory Status: Room air History of Recent Intubation: No Behavior/Cognition: Alert;Cooperative;Pleasant mood Oral Cavity Assessment: Within Functional Limits Oral Care Completed by SLP: No Oral Cavity - Dentition: Adequate natural dentition Self-Feeding Abilities: Able to feed self Patient Positioning: Upright in bed    Oral/Motor/Sensory Function Overall Oral Motor/Sensory Function: Within functional limits   Ice Chips     Thin Liquid Thin Liquid: Within functional limits    Nectar Thick Nectar Thick Liquid: Not tested   Honey Thick Honey Thick Liquid: Not tested   Puree Puree: Within functional limits   Solid     Solid: Within functional limits     Herbie [place], MA  [place] Pager [phone] Office [phone]  Lynann Beaver 09/04/2019,11:17 AM

## 2019-09-04 NOTE — Progress Notes (Addendum)
[facility] Progress Note   Subjective: Up in chair. Oriented to person/place only. Denies SOB. Agitated at present because no one is answering her phone call but hasn't dialed telephone, yelling for help continually.    Objective Vitals:   09/03/19 1928 09/04/19 0300 09/04/19 0457 09/04/19 0741  BP: (!) 121/97 (!) 126/54  (!) 109/49  Pulse: 75 79  77  Resp: 16 15  17   Temp: 98.1 F (36.7 C) 97.8 F (36.6 C)  97.7 F (36.5 C)  TempSrc: Oral Oral  Oral  SpO2: 95% 91%  98%  Weight:   52 kg   Height:   4\' 2"  (1.27 m)    Physical Exam General: Frail woman in NAD Heart: B1,D1 3/6 systolic M Lungs: CTAB A/P Abdomen: Active BS  Extremities: No LE edema  Dialysis Access: R AVG + bruit   Additional Objective Labs: Basic Metabolic Panel: Recent Labs  Lab 09/02/19 0654 09/02/19 0654 09/03/19 0628 09/03/19 1136 09/04/19 0454  NA 133*   < > 135 138 136  K 4.5   < > 2.7* 3.8 3.7  CL 92*   < > 95* 96* 94*  CO2 24   < > 27 26 25   GLUCOSE 90   < > 78 78 73  BUN 34*   < > 15 8 15   CREATININE 6.34*   < > 3.30* 2.96* 3.93*  CALCIUM 8.1*   < > 7.1* 7.6* 7.7*  PHOS 7.7*  --   --  4.1  --    < > = values in this interval not displayed.   Liver Function Tests: Recent Labs  Lab 08/31/19 1300 09/02/19 0654 09/03/19 1136  AST 24  --   --   ALT 15  --   --   ALKPHOS 102  --   --   BILITOT 0.7  --   --   PROT 6.8  --   --   ALBUMIN 3.1* 2.4* 2.4*   Recent Labs  Lab 08/31/19 1300  LIPASE 27   CBC: Recent Labs  Lab 09/01/19 0449 09/01/19 0449 09/02/19 0654 09/02/19 0654 09/03/19 0628 09/03/19 1136 09/04/19 0454  WBC 12.9*   < > 12.6*   < > 8.6 11.0* 7.0  HGB 11.0*   < > 11.4*   < > 10.2* 11.3* 10.0*  HCT 34.9*   < > 35.6*   < > 31.9* 35.1* 31.3*  MCV 97.8  --  97.0  --  96.1 95.1 98.4  PLT 154   < > 186   < > 177 188 208   < > = values in this interval not displayed.   Blood Culture    Component Value Date/Time   SDES BLOOD LEFT ANTECUBITAL  07/18/2018 2250   SPECREQUEST  07/18/2018 2250    BOTTLES DRAWN AEROBIC AND ANAEROBIC Blood Culture adequate volume   CULT  07/18/2018 2250    NO GROWTH 5 DAYS Performed at [facility], [place] [address] [place], [place] [address]    REPTSTATUS 07/23/2018 FINAL 07/18/2018 2250    Cardiac Enzymes: No results for input(s): CKTOTAL, CKMB, CKMBINDEX, TROPONINI in the last 168 hours. CBG: No results for input(s): GLUCAP in the last 168 hours. Iron Studies: No results for input(s): IRON, TIBC, TRANSFERRIN, FERRITIN in the last 72 hours. @lablastinr3 @ Studies/Results: No results found. Medications: . cefTRIAXone (ROCEPHIN)  IV Stopped (09/04/19 0031)  . metronidazole Stopped (09/04/19 0905)   . amiodarone  200 mg Oral Daily  .  aspirin EC  325 mg Oral QPM  . calcitonin (salmon)  1 spray Alternating Nares Daily  . Chlorhexidine Gluconate Cloth  6 each Topical Daily  . doxercalciferol  3 mcg Intravenous Q T,Th,Sa-HD  . feeding supplement (PRO-STAT SUGAR FREE 64)  30 mL Oral BID  . fentaNYL  1 patch Transdermal Q72H  . fentaNYL  1 patch Transdermal Q72H  . heparin  5,000 Units Subcutaneous Q8H  . hydrocortisone cream   Topical QID  . isosorbide mononitrate  60 mg Oral Daily  . melatonin  3 mg Oral QHS  . pravastatin  40 mg Oral QHS  . QUEtiapine  25 mg Oral QHS  . senna-docusate  2 tablet Oral BID  . sevelamer carbonate  1,600 mg Oral TID WC  . sodium chloride flush  3 mL Intravenous Once     Dialysis Orders: MTTS at [facility] 4hr, 350/600, EDW 51kg, 2K/2Ca, AVG,  -heparin 1400 units IV bolus - Hectoral 33mcg IV q HD - No ESA, Hgb > 11  Assessment/Plan: 1. Acute sigmoid diverticulitis: On Ceftriaxone + Flagyl.  2. Back pain: New T12 compression Fx on imaging. Pain control per primary. On nasal calcitonin - watch Ca levels.IR consulted for T12 kyphoplasty. 3. ESRD:MTTS schedule recently - missed last HD. HD completed early Sun morning - finishing now. Volume stable - can  plan for 3d/week while here -> next HD Tues 4/20. See above.  4. Hypertension/volume:BP Stable today.  5. Anemia:Hgb 10.2- follow without ESA for now. 6. Metabolic bone disease:Ca ok, Phoshigh. Continue homebindersand VDRA. 7. Nutrition:Alb low - will addpro-stat. 8. COPD 9. Confusion/delirium: ?related to pain medications. Defer to primary team. 10. GOC: Appreciate palliative care consult. Now DNR. Overall decline in mental and functional status at home. Ongoing discussions about dialysis. Calling palliative to find out what discussions have been had regarding continuing HD.   Valerie Waid H. Corie Vavra NP-C 09/04/2019, 11:17 AM  [employer] [phone]

## 2019-09-04 NOTE — Progress Notes (Signed)
[name], female   DOB: 1932-10-25, 86 y.o.   MRN: [id number]  This NP visited patient at the bedside as a follow up for palliative medicine needs and emotional support.  Patient reports pain is currently  well controlled.   Created space and opportunity for patient to explore thoughts and feelings regarding her current medical situation.  She verbalizes feelings around being a burden to her daughter.  She verbalizes an understanding of her end-stage renal disease.  She tells me " I am not ready to give up, I want to continue dialysis"  Active listening and emotional support offered.  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    I also spoke to [name] by phone.  She to verbalizes an understanding of the seriousness of her mother's medical condition, but is more hopeful today having spoken to her mother by phone and "she seems to be back on track again".  Anticipated kyphoplasty in the next day or 2 for compression fracture  Both patient and her daughter verbalize plan to continue to treat the treatable, continue dialysis and remain hopeful for improvement ;    ultimate goal is to return home.  Discussed with patient the importance of continued conversation with her family and the medical providers regarding overall plan of care and treatment options,  ensuring decisions are within the context of the patients values and GOCs.  Questions and concerns addressed   Discussed with Dr [place] and Juanell Fairly NP with [facility]  PMT will continue to support holistically.  Total time spent on the unit was 35 minutes  Greater than 50% of the time was spent in counseling and coordination of care  Wadie Lessen NP  Palliative Medicine Team Team Phone # [phone] Pager [phone]

## 2019-09-04 NOTE — Progress Notes (Signed)
Chart reviewed. Appears to be clinically improving from diverticulitis. WBC normal at 7 Palliative discussions noted.  Will tentatively plan procedure for Wed 4/21.  Ascencion Dike PA-C Interventional Radiology 09/04/2019 3:14 PM'

## 2019-09-05 MED ORDER — QUETIAPINE FUMARATE 25 MG PO TABS: 12.5000 mg | ORAL_TABLET | Freq: Every day | ORAL | Status: DC

## 2019-09-05 MED ORDER — MELATONIN 3 MG PO TABS
6.0000 mg | ORAL_TABLET | Freq: Every day | ORAL | Status: DC
  Administered 2019-09-05 – 2019-09-08 (×4): 6 mg via ORAL
  Filled 2019-09-05 (×4): qty 2

## 2019-09-05 NOTE — Consult Note (Signed)
   [facility] CM Inpatient Consult   09/05/2019  Valerie Terrell [birth year] [id number]   Patient screened for extreme high risk score for unplanned readmission with less than 30 days re-hospitalization of patient with [facility].  Reviewed for potential [facility] Management needs for disease or care management support.  Review of patient's medical record reveals patient is active with [facility] Palliative program prior to admission.  Reviewed Palliative consult notes for any post hospital barriers or needs.  Primary Care Provider is Shawnie Dapper, MD noted with [facility].  This office is listed for the post hospital transition of care [TOC] follow up.  Plan:  To follow inpatient [facility] team notes and recommendations as she is currently being recommended for a skilled nursing level of care.  Continue to follow progress and disposition to assess for post hospital care management needs.    Please place a [facility] Care Management consult as appropriate and for questions contact:   Natividad Brood, RN BSN [facility] Liaison  [phone] business mobile phone Toll free office [phone]  Fax number: [phone] [email or web address]

## 2019-09-05 NOTE — Plan of Care (Signed)

## 2019-09-05 NOTE — Progress Notes (Addendum)
[facility] Progress Note   Subjective: Up in chair, oriented to person only. No C/Os.   Objective Vitals:   09/05/19 0411 09/05/19 0500 09/05/19 0749 09/05/19 0951  BP: (!) 142/89  (!) 120/58 140/64  Pulse: 82  74 85  Resp: 15  17   Temp: 98 F (36.7 C)  99.1 F (37.3 C)   TempSrc: Oral  Oral   SpO2: 100%  97% 94%  Weight:  51 kg    Height:       Physical Exam General: Frail woman in NAD Heart: N0,N3 3/6 systolic M Lungs: CTAB A/P Abdomen: Active BS  Extremities: No LE edema  Dialysis Access: R AVG + bruit   Additional Objective Labs: Basic Metabolic Panel: Recent Labs  Lab 09/02/19 0654 09/02/19 0654 09/03/19 0628 09/03/19 1136 09/04/19 0454  NA 133*   < > 135 138 136  K 4.5   < > 2.7* 3.8 3.7  CL 92*   < > 95* 96* 94*  CO2 24   < > 27 26 25   GLUCOSE 90   < > 78 78 73  BUN 34*   < > 15 8 15   CREATININE 6.34*   < > 3.30* 2.96* 3.93*  CALCIUM 8.1*   < > 7.1* 7.6* 7.7*  PHOS 7.7*  --   --  4.1  --    < > = values in this interval not displayed.   Liver Function Tests: Recent Labs  Lab 08/31/19 1300 09/02/19 0654 09/03/19 1136  AST 24  --   --   ALT 15  --   --   ALKPHOS 102  --   --   BILITOT 0.7  --   --   PROT 6.8  --   --   ALBUMIN 3.1* 2.4* 2.4*   Recent Labs  Lab 08/31/19 1300  LIPASE 27   CBC: Recent Labs  Lab 09/01/19 0449 09/01/19 0449 09/02/19 0654 09/02/19 0654 09/03/19 0628 09/03/19 1136 09/04/19 0454  WBC 12.9*   < > 12.6*   < > 8.6 11.0* 7.0  HGB 11.0*   < > 11.4*   < > 10.2* 11.3* 10.0*  HCT 34.9*   < > 35.6*   < > 31.9* 35.1* 31.3*  MCV 97.8  --  97.0  --  96.1 95.1 98.4  PLT 154   < > 186   < > 177 188 208   < > = values in this interval not displayed.   Blood Culture    Component Value Date/Time   SDES BLOOD LEFT ANTECUBITAL 07/18/2018 2250   SPECREQUEST  07/18/2018 2250    BOTTLES DRAWN AEROBIC AND ANAEROBIC Blood Culture adequate volume   CULT  07/18/2018 2250    NO GROWTH 5 DAYS Performed  at [facility], [place] [address], [place], [place] [address]    REPTSTATUS 07/23/2018 FINAL 07/18/2018 2250    Cardiac Enzymes: No results for input(s): CKTOTAL, CKMB, CKMBINDEX, TROPONINI in the last 168 hours. CBG: No results for input(s): GLUCAP in the last 168 hours. Iron Studies: No results for input(s): IRON, TIBC, TRANSFERRIN, FERRITIN in the last 72 hours. @lablastinr3 @ Studies/Results: No results found. Medications: . cefTRIAXone (ROCEPHIN)  IV 2 g (09/04/19 2133)  . metronidazole 500 mg (09/05/19 1001)   . amiodarone  200 mg Oral Daily  . aspirin EC  325 mg Oral QPM  . calcitonin (salmon)  1 spray Alternating Nares Daily  . Chlorhexidine Gluconate Cloth  6 each  Topical Daily  . doxercalciferol  3 mcg Intravenous Q T,Th,Sa-HD  . feeding supplement (PRO-STAT SUGAR FREE 64)  30 mL Oral BID  . fentaNYL  1 patch Transdermal Q72H  . fentaNYL  1 patch Transdermal Q72H  . heparin  5,000 Units Subcutaneous Q8H  . hydrocortisone cream   Topical QID  . isosorbide mononitrate  60 mg Oral Daily  . melatonin  6 mg Oral QHS  . pravastatin  40 mg Oral QHS  . senna-docusate  2 tablet Oral BID  . sevelamer carbonate  1,600 mg Oral TID WC  . sodium chloride flush  3 mL Intravenous Once     Dialysis Orders: MTTS at [facility] 4hr, 350/600, EDW 51kg, 2K/2Ca, AVG,  -heparin 1400 units IV bolus - Hectoral 27mcg IV q HD - NoESA, Hgb >11  Assessment/Plan: 1. Acute sigmoid diverticulitis: On Ceftriaxone + Flagyl.  2. Back pain: New T12 compression Fx on imaging. Pain control per primary. On nasal calcitonin - watch Ca levels.IR consulted for T12 kyphoplasty. 3. ESRD:MTTS schedule recently - missed last HD.HD completed early Sun morning - finishing now.Volume stable - can plan for 3d/week while here ->next HD Tues today. See above.  4. Hypertension/volume:BPStable today.  5. Anemia:Hgb 10.2- follow without ESA for now. 6. Metabolic bone disease:Ca ok, Phoshigh.  Continue homebindersand VDRA. 7. Nutrition:Alb low - will addpro-stat. 8. COPD 9. Confusion/delirium: ?related to pain medications. Defer to primary team. 10. GOC: Appreciate palliative care consult. Now DNR. Overall decline in mental and functional status at home. Ongoing discussions about dialysis. Discussed with palliative care yesterday-pt does not wish to stop HD at present.   Rita H. Brown NP-C 09/05/2019, 11:16 AM  [facility] [phone]  Pt seen, examined and agree w A/P as above. Pt is debilitated , will need to be able to sit up in a chair 4 hrs for HD in order to be able to dc to home or SNF.   Kelly Splinter  MD 09/05/2019, 1:25 PM

## 2019-09-05 NOTE — Progress Notes (Signed)
Called HD to verify time of hemodialysis today but RN was informed that they could not give specific time, it could be later today or tomorrow, daughter at bedside updated.

## 2019-09-05 NOTE — Plan of Care (Signed)

## 2019-09-05 NOTE — Progress Notes (Signed)
PROGRESS NOTE    Valerie Terrell  MMN:[id number] DOB: [birth year] DOA: 08/31/2019 PCP: Tammi Sou, MD    Brief Narrative:  Valerie Terrell is a 86 y.o. female with history of ESRD on hemodialysis on Tuesday Thursday Saturdays who was recently admitted for fluid overload and discharged on August 17, 2019 presents to the ER because of abdominal pain.  Patient states she has been having abdominal pain generalized for the last 1 week which has been gradually worsening.  Over the last 2 days patient also had some nausea vomiting.  Denies any blood in the vomitus.  Denies any fall or trauma.  States she does not miss her dialysis.  Denies chest pain or shortness of breath.  No fever chills.  In the ER CT scan of the abdomen pelvis shows diverticulitis of sigmoid colon and also a new T12 compression fracture.  Patient is able to move extremities and denies any incontinence of urine or bowel.  Labs show WBC count of 18 potassium 3.8 creatinine 4.2 LFTs normal except for albumin of 3.1.  EKG shows normal sinus rhythm.  Covid test was negative.  On exam abdomen appears benign. Patient is able to move all extremities.  Patient was started on ceftriaxone and Flagyl for diverticulitis.   Assessment & Plan:   Principal Problem:   Diverticulitis Active Problems:   PAF (paroxysmal atrial fibrillation) (HCC)   Essential hypertension   History of CVA (cerebrovascular accident)   Cardiac pacemaker   Non-ischemic cardiomyopathy ([place])   ESRD on dialysis ([place])   ESRD (end stage renal disease) ([place])   T12 compression fracture ([place])   Acute diverticulitis   Palliative care by specialist   Goals of care, counseling/discussion   DNR (do not resuscitate)   Inadequate pain control   Acute sigmoid diverticulitis Patient presenting with progressive abdominal pain over the past week.  Patient is afebrile with a leukocytosis of 18.0.  CT abdomen/pelvis notable for slight differential wall thickening sigmoid  colon with pericolonic fat stranding consistent with acute diverticulitis.  Evaluated by speech therapy, no concerns for aspiration. --WBC 18.0-->12.9-->12.6-->8.6-->7.0 --Continue antibiotics with ceftriaxone and Flagyl --Advance diet from full liquid to soft today. --Supportive care, antiemetics prn  New T12 compression fracture with intractable pain Hx of chronic pain CT abdomen/pelvis notable for T12 compression fracture which appears new with less than 25% height loss.  Patient with history of chronic pain, now with intractable low back pain. --Telemetry care following and assisting with pain management, appreciate assistance --IR consulted to evaluate for kyphoplasty, plan procedure tomorrow on 09/06/2019 -- fentanyl patch increased to 62 mcg q72h --Continue home oxycodone IR 10-20mg  q6h prn --Calcitriol 1 spray nasal daily, closely monitoring calcium levels daily --Continue PT/OT efforts, currently recommending SNF placement, patient/daughter reluctant, likely will discharge home with home health.  Social work is working on backup options if unable.  ESRD on HD MTTS Recently admitted and discharged on August 17, 2019 for volume overload.  Now receives hemodialysis 4 days/week.  Dialyzes at [facility] dialysis center.  Missed dialysis on 08/31/2019.   --Nephrology following, appreciate assistance --Currently on supplemental oxygen 3L [place], suspect some volume overload; did not have dialysis yesterday, titrate to maintain SPO2 greater than 92% --Continue HD, next session planned for today --Strict I's and O's and daily weights  History of tachybrady syndrome s/p PPM Paroxysmal atrial fibrillation Currently rate controlled --Continue amiodarone 200 mg p.o. daily --Not on chronic anticoagulation secondary to frequent falls --Discontinued telemetry 4/17  HLD: Continue pravastatin 40  mg p.o. daily  History of nonischemic cardiomyopathy Essential hypertension TTE 11/2017 with EF 55-60%  (improved from 35% prior), grade 2 diastolic dysfunction, mild/moderate MR, LA moderately dilated, mild TR --Continue Imdur 60 mg p.o. daily  --Fluid restrict 1200 mL/day  Confusion with agitation Patient with confusion and agitation, possible underlying dementia coupled with acute hospital-acquired delirium in the setting of infectious process as above. --Discontinued Seroquel due to interaction with amiodarone causing QT prolongation --Increased melatonin to 6 mg p.o. daily  Constipation: Resolved Patient had bowel movement yesterday. --Senokot 2 tabs twice daily --bisacodyl 10mg  supp today --Miralax prn daily  Sacral stage I pressure ulcer, present on admission --Continue local wound care, offloading frequently  Pressure Injury 08/16/19 Sacrum Medial Stage 1 -  Intact skin with non-blanchable redness of a localized area usually over a bony prominence. (Active)  08/16/19 1917  Location: Sacrum  Location Orientation: Medial  Staging: Stage 1 -  Intact skin with non-blanchable redness of a localized area usually over a bony prominence.  Wound Description (Comments):   Present on Admission: Yes    Ethics Patient now with 2 hospitalization over the past month, initially hospitalized for volume overload, now goes outpatient dialysis 4 times weekly which is fairly new.  Now readmitted with acute sigmoid diverticulitis with subacute T12 fracture.  Followed very recently by [facility] palliative care outpatient. Daughter slightly emotional this morning regarding life expectancy of her mother, she seems to be having a very hard time managing her alone with very little family support as other family members reside in [place] and have their own chronic medical conditions.  Palliative care following for assistance in goals of care and medical decision making.  CODE STATUS now changed to DNR.  Do have concerns regarding her advanced age, HD dependency and continued confusion, suspect some underlying  dementia which is exacerbated by recurrent hospitalizations with acute delirium.   DVT prophylaxis: Heparin Code Status: DNR Family Communication: Updated daughter Bryson Ha who is present at bedside this morning  Disposition Plan:  Status is: Inpatient  Remains inpatient appropriate because:Ongoing active pain requiring inpatient pain management, Ongoing diagnostic testing needed not appropriate for outpatient work up, Unsafe d/c plan and Inpatient level of care appropriate due to severity of illness pending IR kyphoplasty   Dispo: The patient is from: Home              Anticipated d/c is to: Home vs SNF (likely home with [facility])              Anticipated d/c date is: 2 days              Patient currently is not medically stable to d/c.    Consultants:   IR  Procedures:   T12 kyphoplasty: Pending 4/21  Antimicrobials:   Ceftriaxone 4/15>>  Flagyl 4/15>>   Subjective: Patient seen and examined at bedside this morning, resting comfortably.  Plan for dialysis today and kyphoplasty tomorrow.  Patient states pain is currently controlled.  No complaints at this time. Denies headache, no fever/chills/night sweats, no nausea/vomiting diarrhea, no chest pain, no palpitations, no abdominal pain.  No acute events overnight per nursing staff.  Objective: Vitals:   09/05/19 0411 09/05/19 0500 09/05/19 0749 09/05/19 0951  BP: (!) 142/89  (!) 120/58 140/64  Pulse: 82  74 85  Resp: 15  17   Temp: 98 F (36.7 C)  99.1 F (37.3 C)   TempSrc: Oral  Oral   SpO2: 100%  97% 94%  Weight:  51 kg    Height:        Intake/Output Summary (Last 24 hours) at 09/05/2019 1057 Last data filed at 09/05/2019 1000 Gross per 24 hour  Intake 850.15 ml  Output --  Net 850.15 ml   Filed Weights   09/04/19 0457 09/05/19 0500  Weight: 52 kg 51 kg    Examination:  General exam: Alert, calm, currently appears comfortable Respiratory system: Clear to auscultation without wheezing/crackles.  Respiratory effort normal.  On 3 L nasal cannula oxygenating 100% Cardiovascular system: S1 & S2 heard, RRR. No JVD, murmurs, rubs, gallops or clicks. No pedal edema. Gastrointestinal system: Abdomen is nondistended, soft, nontender to palpation. No organomegaly or masses felt.  Normal active bowel sounds. Central nervous system: Alert, oriented to place, person, but not time (2020) and situation. No focal neurological deficits. Extremities: Symmetric 5 x 5 power.  Moves all extremities independently Skin: No rashes, lesions or ulcers, chronic venous stasis changes bilateral lower extremities Psychiatry: Judgement and insight appear poor. Mood & affect appropriate.     Data Reviewed: I have personally reviewed following labs and imaging studies  CBC: Recent Labs  Lab 09/01/19 0449 09/02/19 0654 09/03/19 0628 09/03/19 1136 09/04/19 0454  WBC 12.9* 12.6* 8.6 11.0* 7.0  HGB 11.0* 11.4* 10.2* 11.3* 10.0*  HCT 34.9* 35.6* 31.9* 35.1* 31.3*  MCV 97.8 97.0 96.1 95.1 98.4  PLT 154 186 177 188 790   Basic Metabolic Panel: Recent Labs  Lab 09/01/19 0449 09/02/19 0654 09/03/19 0628 09/03/19 1136 09/04/19 0454  NA 133* 133* 135 138 136  K 4.1 4.5 2.7* 3.8 3.7  CL 91* 92* 95* 96* 94*  CO2 27 24 27 26 25   GLUCOSE 88 90 78 78 73  BUN 25* 34* 15 8 15   CREATININE 4.98* 6.34* 3.30* 2.96* 3.93*  CALCIUM 8.4* 8.1* 7.1* 7.6* 7.7*  MG  --  2.2  --   --  1.9  PHOS  --  7.7*  --  4.1  --    GFR: Estimated Creatinine Clearance: 5.7 mL/min (A) (by C-G formula based on SCr of 3.93 mg/dL (H)). Liver Function Tests: Recent Labs  Lab 08/31/19 1300 09/02/19 0654 09/03/19 1136  AST 24  --   --   ALT 15  --   --   ALKPHOS 102  --   --   BILITOT 0.7  --   --   PROT 6.8  --   --   ALBUMIN 3.1* 2.4* 2.4*   Recent Labs  Lab 08/31/19 1300  LIPASE 27   No results for input(s): AMMONIA in the last 168 hours. Coagulation Profile: No results for input(s): INR, PROTIME in the last 168  hours. Cardiac Enzymes: No results for input(s): CKTOTAL, CKMB, CKMBINDEX, TROPONINI in the last 168 hours. BNP (last 3 results) No results for input(s): PROBNP in the last 8760 hours. HbA1C: No results for input(s): HGBA1C in the last 72 hours. CBG: No results for input(s): GLUCAP in the last 168 hours. Lipid Profile: No results for input(s): CHOL, HDL, LDLCALC, TRIG, CHOLHDL, LDLDIRECT in the last 72 hours. Thyroid Function Tests: No results for input(s): TSH, T4TOTAL, FREET4, T3FREE, THYROIDAB in the last 72 hours. Anemia Panel: Recent Labs    09/03/19 0628  VITAMINB12 579   Sepsis Labs: No results for input(s): PROCALCITON, LATICACIDVEN in the last 168 hours.  Recent Results (from the past 240 hour(s))  SARS CORONAVIRUS 2 (TAT 6-24 HRS) Nasopharyngeal Nasopharyngeal Swab     Status:  None   Collection Time: 08/31/19  8:32 PM   Specimen: Nasopharyngeal Swab  Result Value Ref Range Status   SARS Coronavirus 2 NEGATIVE NEGATIVE Final    Comment: (NOTE) SARS-CoV-2 target nucleic acids are NOT DETECTED. The SARS-CoV-2 RNA is generally detectable in upper and lower respiratory specimens during the acute phase of infection. Negative results do not preclude SARS-CoV-2 infection, do not rule out co-infections with other pathogens, and should not be used as the sole basis for treatment or other patient management decisions. Negative results must be combined with clinical observations, patient history, and epidemiological information. The expected result is Negative. Fact Sheet for Patients: SugarRoll.be Fact Sheet for Healthcare Providers: https://www.woods-mathews.com/ This test is not yet approved or cleared by the [place] FDA and  has been authorized for detection and/or diagnosis of SARS-CoV-2 by FDA under an Emergency Use Authorization (EUA). This EUA will remain  in effect (meaning this test can be used) for the duration of  the COVID-19 declaration under Section 56 4(b)(1) of the Act, 21 U.S.C. section 360bbb-3(b)(1), unless the authorization is terminated or revoked sooner. Performed at [facility], [place] [address] [place], [place] [address]          Radiology Studies: No results found.      Scheduled Meds: . amiodarone  200 mg Oral Daily  . aspirin EC  325 mg Oral QPM  . calcitonin (salmon)  1 spray Alternating Nares Daily  . Chlorhexidine Gluconate Cloth  6 each Topical Daily  . doxercalciferol  3 mcg Intravenous Q T,Th,Sa-HD  . feeding supplement (PRO-STAT SUGAR FREE 64)  30 mL Oral BID  . fentaNYL  1 patch Transdermal Q72H  . fentaNYL  1 patch Transdermal Q72H  . heparin  5,000 Units Subcutaneous Q8H  . hydrocortisone cream   Topical QID  . isosorbide mononitrate  60 mg Oral Daily  . melatonin  6 mg Oral QHS  . pravastatin  40 mg Oral QHS  . senna-docusate  2 tablet Oral BID  . sevelamer carbonate  1,600 mg Oral TID WC  . sodium chloride flush  3 mL Intravenous Once   Continuous Infusions: . cefTRIAXone (ROCEPHIN)  IV 2 g (09/04/19 2133)  . metronidazole 500 mg (09/05/19 1001)     LOS: 4 days    Time spent: 34 minutes spent on chart review, discussion with nursing staff, consultants, updating family and interview/physical exam; more than 50% of that time was spent in counseling and/or coordination of care.    Chukwuemeka Artola J [place] DO [facility] Available via Epic secure chat 7am-7pm After these hours, please refer to coverage provider listed on [email or web address] 09/05/2019, 10:57 AM

## 2019-09-06 ENCOUNTER — Inpatient Hospital Stay (HOSPITAL_COMMUNITY): Payer: Medicare Other

## 2019-09-06 ENCOUNTER — Other Ambulatory Visit: Payer: Self-pay

## 2019-09-06 ENCOUNTER — Telehealth: Payer: Medicare Other | Admitting: Family Medicine

## 2019-09-06 DIAGNOSIS — Z8673 Personal history of transient ischemic attack (TIA), and cerebral infarction without residual deficits: Secondary | ICD-10-CM

## 2019-09-06 DIAGNOSIS — S22089A Unspecified fracture of T11-T12 vertebra, initial encounter for closed fracture: Secondary | ICD-10-CM

## 2019-09-06 DIAGNOSIS — I428 Other cardiomyopathies: Secondary | ICD-10-CM

## 2019-09-06 DIAGNOSIS — Z7189 Other specified counseling: Secondary | ICD-10-CM

## 2019-09-06 DIAGNOSIS — I10 Essential (primary) hypertension: Secondary | ICD-10-CM

## 2019-09-06 DIAGNOSIS — I480 Paroxysmal atrial fibrillation: Secondary | ICD-10-CM

## 2019-09-06 DIAGNOSIS — K5792 Diverticulitis of intestine, part unspecified, without perforation or abscess without bleeding: Secondary | ICD-10-CM

## 2019-09-06 HISTORY — PX: IR KYPHO THORACIC WITH BONE BIOPSY: IMG5518

## 2019-09-06 LAB — CBC
HCT: 33.4 % — ABNORMAL LOW (ref 36.0–46.0)
Hemoglobin: 10.5 g/dL — ABNORMAL LOW (ref 12.0–15.0)
MCH: 31.2 pg (ref 26.0–34.0)
MCHC: 31.4 g/dL (ref 30.0–36.0)
MCV: 99.1 fL (ref 80.0–100.0)
Platelets: 191 10*3/uL (ref 150–400)
RBC: 3.37 MIL/uL — ABNORMAL LOW (ref 3.87–5.11)
RDW: 15.8 % — ABNORMAL HIGH (ref 11.5–15.5)
WBC: 9.3 10*3/uL (ref 4.0–10.5)
nRBC: 0 % (ref 0.0–0.2)

## 2019-09-06 LAB — RENAL FUNCTION PANEL
Albumin: 2.1 g/dL — ABNORMAL LOW (ref 3.5–5.0)
Anion gap: 10 (ref 5–15)
BUN: 8 mg/dL (ref 8–23)
CO2: 30 mmol/L (ref 22–32)
Calcium: 7.9 mg/dL — ABNORMAL LOW (ref 8.9–10.3)
Chloride: 98 mmol/L (ref 98–111)
Creatinine, Ser: 2.3 mg/dL — ABNORMAL HIGH (ref 0.44–1.00)
GFR calc Af Amer: 22 mL/min — ABNORMAL LOW (ref >60)
GFR calc non Af Amer: 19 mL/min — ABNORMAL LOW (ref >60)
Glucose, Bld: 75 mg/dL (ref 70–99)
Phosphorus: 3.3 mg/dL (ref 2.5–4.6)
Potassium: 3.5 mmol/L (ref 3.5–5.1)
Sodium: 138 mmol/L (ref 135–145)

## 2019-09-06 LAB — MAGNESIUM: Magnesium: 1.8 mg/dL (ref 1.7–2.4)

## 2019-09-06 MED ORDER — CEFAZOLIN SODIUM-DEXTROSE 1-4 GM/50ML-% IV SOLN
INTRAVENOUS | Status: AC | PRN
  Administered 2019-09-06: 2 g via INTRAVENOUS

## 2019-09-06 MED ORDER — FENTANYL CITRATE (PF) 100 MCG/2ML IJ SOLN
INTRAMUSCULAR | Status: AC
  Filled 2019-09-06: qty 2

## 2019-09-06 MED ORDER — MIDAZOLAM HCL 2 MG/2ML IJ SOLN
INTRAMUSCULAR | Status: AC
  Filled 2019-09-06: qty 2

## 2019-09-06 MED ORDER — BUPIVACAINE HCL (PF) 0.5 % IJ SOLN
INTRAMUSCULAR | Status: AC | PRN
  Administered 2019-09-06: 20 mL

## 2019-09-06 MED ORDER — BUPIVACAINE HCL (PF) 0.5 % IJ SOLN
INTRAMUSCULAR | Status: AC
  Filled 2019-09-06: qty 30

## 2019-09-06 MED ORDER — CEFAZOLIN SODIUM-DEXTROSE 2-4 GM/100ML-% IV SOLN
INTRAVENOUS | Status: AC
  Filled 2019-09-06: qty 100

## 2019-09-06 MED ORDER — LIDOCAINE HCL 1 % IJ SOLN
INTRAMUSCULAR | Status: AC
  Filled 2019-09-06: qty 20

## 2019-09-06 MED ORDER — FENTANYL CITRATE (PF) 100 MCG/2ML IJ SOLN
INTRAMUSCULAR | Status: AC | PRN
  Administered 2019-09-06: 50 ug via INTRAVENOUS

## 2019-09-06 MED ORDER — IOHEXOL 300 MG/ML  SOLN
50.0000 mL | Freq: Once | INTRAMUSCULAR | Status: AC | PRN
  Administered 2019-09-06: 5 mL

## 2019-09-06 MED ORDER — LIDOCAINE HCL 1 % IJ SOLN
INTRAMUSCULAR | Status: AC | PRN
  Administered 2019-09-06: 10 mL

## 2019-09-06 MED ORDER — MIDAZOLAM HCL 2 MG/2ML IJ SOLN
INTRAMUSCULAR | Status: AC | PRN
  Administered 2019-09-06: 0.5 mg via INTRAVENOUS
  Administered 2019-09-06: 1 mg via INTRAVENOUS

## 2019-09-06 NOTE — Plan of Care (Signed)
  Problem: Education: Goal: Knowledge of General Education information will improve Description: Including pain rating scale, medication(s)/side effects and non-pharmacologic comfort measures Outcome: Progressing   Problem: Health Behavior/Discharge Planning: Goal: Ability to manage health-related needs will improve Outcome: Progressing   Problem: Clinical Measurements: Goal: Will remain free from infection Outcome: Progressing Goal: Respiratory complications will improve Outcome: Progressing   Problem: Activity: Goal: Risk for activity intolerance will decrease Outcome: Progressing   Problem: Pain Managment: Goal: General experience of comfort will improve Outcome: Progressing   Problem: Safety: Goal: Ability to remain free from injury will improve Outcome: Progressing   Problem: Skin Integrity: Goal: Risk for impaired skin integrity will decrease Outcome: Progressing

## 2019-09-06 NOTE — Procedures (Signed)
INTERVENTIONAL NEURORADIOLOGY BRIEF POSTPROCEDURE NOTE  Valerie Terrell  Attending: Dr. Pedro Earls  Assistant: None  Diagnosis: T11 compression fracture  Access site: Percutaneous bilateral transpedicular   Anesthesia: Moderate sedation  Medication used: 1.5 mg Versed IV; 50 mcg Fentanyl IV.  Complications: None  Estimated blood loss: Minimal  Specimen: T11 vertebral body core biopsy  Findings: Confirmed compression fracture of T11. Balloon kypoplasty performed with bilateral transpedicular approach.  The patient tolerated the procedure well without incident or complication and is in stable condition.

## 2019-09-06 NOTE — Progress Notes (Signed)
Physical Therapy Treatment Patient Details Name: Valerie Terrell MRN: [id number] DOB: [birth year] Today's Date: 09/06/2019    History of Present Illness Valerie Terrell is a 86 y.o. female with history of ESRD on hemodialysis on Tuesday Thursday Saturdays who was recently admitted for fluid overload and discharged on August 17, 2019 presents to the ER because of abdominal pain. CT showed diverticulitis of sigmoid colon and new T12 compression fracture, further imaging showed the fx was actually T11 and s/p kyphoplasty on 09/06/19. Started on ceftriaxone and Flagyl for diverticulitis.    PT Comments    Pt supine in bed on arrival this session.  Pt slow and guarded and very drowsy this session.  She was unable to progress to standing and only able to sit edge of bed for limited period of time.  Continue to recommend rehab in a post acute setting.  Will plan for supervising PT to follow up given decline in function s/p kyphoplasty.     Follow Up Recommendations  SNF;Supervision/Assistance - 24 hour     Equipment Recommendations  Wheelchair (measurements PT);Wheelchair cushion (measurements PT)    Recommendations for Other Services       Precautions / Restrictions Precautions Precautions: Fall Restrictions Weight Bearing Restrictions: No    Mobility  Bed Mobility Overal bed mobility: Needs Assistance Bed Mobility: Rolling;Sidelying to Sit;Sit to Sidelying Rolling: Max assist Sidelying to sit: Max assist;+2 for physical assistance     Sit to sidelying: Max assist;+2 for physical assistance General bed mobility comments: Pt required max +2 for all aspects of bed mobility.  Utililized log rolling technique to protect spine and limit pain.  Pt sat edge of bed with rounded spine and poor postural control.  Unable to progress further and returned to bed and positioned for comfort.  Transfers                    Ambulation/Gait                 Stairs              Wheelchair Mobility    Modified Rankin (Stroke Patients Only)       Balance     Sitting balance-Leahy Scale: Zero Sitting balance - Comments: LOb posterior with posterior pelvic tilr noted.                                    Cognition Arousal/Alertness: Lethargic;Suspect due to medications Behavior During Therapy: Flat affect Overall Cognitive Status: Impaired/Different from baseline                                 General Comments: very lethargic with nonsensical conversation, perseverated on needing her phone throughout session.      Exercises      General Comments        Pertinent Vitals/Pain Pain Assessment: Faces Faces Pain Scale: Hurts even more Pain Location: back Pain Descriptors / Indicators: Grimacing;Guarding;Moaning Pain Intervention(s): Monitored during session;Repositioned    Home Living                      Prior Function            PT Goals (current goals can now be found in the care plan section) Acute Rehab PT Goals Patient Stated Goal: unable to state (lethargy) Potential to  Achieve Goals: Fair Progress towards PT goals: Progressing toward goals    Frequency    Min 3X/week      PT Plan Current plan remains appropriate    Co-evaluation              AM-PAC PT "6 Clicks" Mobility   Outcome Measure  Help needed turning from your back to your side while in a flat bed without using bedrails?: Total Help needed moving from lying on your back to sitting on the side of a flat bed without using bedrails?: Total Help needed moving to and from a bed to a chair (including a wheelchair)?: Total Help needed standing up from a chair using your arms (e.g., wheelchair or bedside chair)?: Total Help needed to walk in hospital room?: Total Help needed climbing 3-5 steps with a railing? : Total 6 Click Score: 6    End of Session Equipment Utilized During Treatment: Gait belt Activity Tolerance:  Patient limited by pain Patient left: in bed;with call bell/phone within reach;with bed alarm set Nurse Communication: Mobility status PT Visit Diagnosis: Unsteadiness on feet (R26.81);Other abnormalities of gait and mobility (R26.89);Pain;Difficulty in walking, not elsewhere classified (R26.2) Pain - part of body: (back, L flank)     Time: 7482-7078 PT Time Calculation (min) (ACUTE ONLY): 13 min  Charges:  $Therapeutic Activity: 8-22 mins                     Erasmo Leventhal , PTA Acute Rehabilitation Services Pager [phone] Office [phone]     Nemiah Kissner Eli Hose 09/06/2019, 5:48 PM

## 2019-09-06 NOTE — Progress Notes (Addendum)
PROGRESS NOTE    Valerie Terrell  KCL:[id number] DOB: 01-31-1933 DOA: 08/31/2019 PCP: Tammi Sou, MD    Brief Narrative:   Valerie Terrell is a 86 y.o. female with history of ESRD on hemodialysis on Tuesday, Thursday, Saturdays who was recently admitted for fluid overload and discharged on August 17, 2019 presented to the ER because of abdominal pain.  Patient stated that she had been having abdominal pain generalized for the last 1 week which had been gradually worsening.   In the ER, CT scan of the abdomen pelvis shows diverticulitis of sigmoid colon and also a new T12 compression fracture.  Patient was able to move extremities and denied any incontinence of urine or bowel.  Labs showed WBC count of 18, potassium 3.8 creatinine 4.2 LFTs normal except for albumin of 3.1.  EKG showed normal sinus rhythm.  Covid test was negative.  On exam abdomen was benign. Patient was able to move all extremities.  Patient was started on ceftriaxone and Flagyl for diverticulitis and was admitted to the hospital.  Assessment & Plan:   Principal Problem:   Diverticulitis Active Problems:   PAF (paroxysmal atrial fibrillation) (HCC)   Essential hypertension   History of CVA (cerebrovascular accident)   Cardiac pacemaker   Non-ischemic cardiomyopathy ([place])   ESRD on dialysis ([place])   ESRD (end stage renal disease) ([place])   T12 compression fracture ([place])   Acute diverticulitis   Palliative care by specialist   Goals of care, counseling/discussion   DNR (do not resuscitate)   Inadequate pain control   Acute sigmoid diverticulitis Improving at this time.  Leukocytosis has improved to 9.3K from 18K.  CT abdomen/pelvis notable for slight differential wall thickening sigmoid colon with pericolonic fat stranding consistent with acute diverticulitis. On ceftriaxone and Flagyl.  Diet has been advanced and is tolerating.  New T12 compression fracture with intractable pain, Hx of chronic pain CT abdomen/pelvis  notable for T12 compression fracture which appears new with less than 25% height loss.  Status post IR guided kyphoplasty,  on 09/06/2019.  Continue oxycodone and fentanyl patch.  Physical therapy recommending SNF placement, patient/daughter reluctant about skilled nursing facility and considering home with home health.  PT therapy to continue.  ESRD on HD MTTS Currently receiving  hemodialysis 4 days/week.  Dialyzes at [facility].  Missed dialysis on 08/31/2019.  Seen by nephrology.  Continue hemodialysis while in the hospital  History of tachybrady syndrome s/p PPM, Paroxysmal atrial fibrillation Rate controlled.  Continue amiodarone.  Not on anticoagulation due to frequent falls.  Hyperlipidemia continue statin.  History of nonischemic cardiomyopathy/Essential hypertension Transthoracic echocardiogram on 11/2017 with EF 55-60% (improved from 35% prior), grade 2 diastolic dysfunction, mild/moderate MR, LA moderately dilated, mild TR. we will continue Imdur, fluid restriction.  Confusion with agitation Improved, possibility of mild dementia and in hospital induced delirium, use of narcotics.  Constipation: Improved  Sacral stage I pressure ulcer, present on admission --Continue local wound care, continue offloading frequently  Pressure Injury 08/16/19 Sacrum Medial Stage 1 -  Intact skin with non-blanchable redness of a localized area usually over a bony prominence. (Active)  08/16/19 1917  Location: Sacrum  Location Orientation: Medial  Staging: Stage 1 -  Intact skin with non-blanchable redness of a localized area usually over a bony prominence.  Wound Description (Comments):   Present on Admission: Yes    Ethics Followed by palliative care as outpatient.  Currently DNR.  Family wishing to continue with dialysis.  DVT prophylaxis:  Heparin subcu  Code Status: DNR  Family Communication:   I spoke with the patient's daughter and updated her about the clinical  condition of the patient. Spoke about SNF but is not motivated for that. Will likely plan for dispo home pending repeat PT evaluation and HD in am.  Disposition Plan:  Status is: Inpatient  Remains inpatient appropriate because:Ongoing active pain requiring inpatient pain management, Ongoing diagnostic testing needed not appropriate for outpatient work up, Unsafe d/c plan and Inpatient level of care appropriate due to severity of illness status post kyphoplasty today, hemodialysis tomorrow  Dispo: The patient is from: Home              Anticipated d/c is to: Home vs SNF (likely home with [facility])              Anticipated d/c date is: 2 days              Patient currently is not medically stable to d/c.   Consultants:  IR  Procedures:  T11 kyphoplasty: 4/21  Antimicrobials:  Ceftriaxone 4/15>> Flagyl 4/15>>   Subjective: The, patient was seen and examined examined after kyphoplasty.  Denies overt back pain.  Communicative.  Denies shortness of breath, chest pain but has mild cough.  Seen during eating.   Objective: Vitals:   09/06/19 1130 09/06/19 1200 09/06/19 1230 09/06/19 1300  BP: (!) 117/49 (!) 119/49 (!) 125/47 (!) 126/48  Pulse: 68 72 74 76  Resp: 15 15 16 16   Temp: 98.1 F (36.7 C) 98.1 F (36.7 C) 98.3 F (36.8 C) 98.6 F (37 C)  TempSrc: Oral Oral Oral Oral  SpO2: 98% 98% 98% 98%  Weight:      Height:        Intake/Output Summary (Last 24 hours) at 09/06/2019 1325 Last data filed at 09/06/2019 0900 Gross per 24 hour  Intake 120 ml  Output 770 ml  Net -650 ml   Filed Weights   09/05/19 0500 09/05/19 2223 09/06/19 0458  Weight: 51 kg 55 kg 53.3 kg    Examination:  General:  Average built, not in obvious distress, on nasal cannula oxygen, communicative, appears frail and deconditioned HENT: Normocephalic, pupils equally reacting to light and accommodation.  No scleral pallor or icterus noted. Oral mucosa is moist.  Chest:  Clear breath sounds.  Diminished  breath sounds bilaterally. No crackles or wheezes.  CVS: S1 &S2 heard.  Systolic murmur regular rate and rhythm. Abdomen: Soft, nontender, nondistended.  Bowel sounds are heard.  Liver is not palpable, no abdominal mass palpated Extremities: No cyanosis, clubbing with chronic venous stasis of the bilateral lower extremities.  Right upper extremity AV graft. Psych: Alert, awake and communicative normal mood CNS:  No cranial nerve deficits.  Power equal in all extremities.  No sensory deficits noted.  No cerebellar signs.   Skin: Warm and dry.  No rashes noted.   Data Reviewed: I have personally reviewed following labs and imaging studies  CBC: Recent Labs  Lab 09/02/19 0654 09/03/19 0628 09/03/19 1136 09/04/19 0454 09/06/19 0349  WBC 12.6* 8.6 11.0* 7.0 9.3  HGB 11.4* 10.2* 11.3* 10.0* 10.5*  HCT 35.6* 31.9* 35.1* 31.3* 33.4*  MCV 97.0 96.1 95.1 98.4 99.1  PLT 186 177 188 208 761   Basic Metabolic Panel: Recent Labs  Lab 09/02/19 0654 09/03/19 0628 09/03/19 1136 09/04/19 0454 09/06/19 0349  NA 133* 135 138 136 138  K 4.5 2.7* 3.8 3.7 3.5  CL 92* 95* 96*  94* 98  CO2 24 27 26 25 30   GLUCOSE 90 78 78 73 75  BUN 34* 15 8 15 8   CREATININE 6.34* 3.30* 2.96* 3.93* 2.30*  CALCIUM 8.1* 7.1* 7.6* 7.7* 7.9*  MG 2.2  --   --  1.9 1.8  PHOS 7.7*  --  4.1  --  3.3   GFR: Estimated Creatinine Clearance: 10 mL/min (A) (by C-G formula based on SCr of 2.3 mg/dL (H)). Liver Function Tests: Recent Labs  Lab 08/31/19 1300 09/02/19 0654 09/03/19 1136 09/06/19 0349  AST 24  --   --   --   ALT 15  --   --   --   ALKPHOS 102  --   --   --   BILITOT 0.7  --   --   --   PROT 6.8  --   --   --   ALBUMIN 3.1* 2.4* 2.4* 2.1*   Recent Labs  Lab 08/31/19 1300  LIPASE 27   No results for input(s): AMMONIA in the last 168 hours. Coagulation Profile: No results for input(s): INR, PROTIME in the last 168 hours. Cardiac Enzymes: No results for input(s): CKTOTAL, CKMB, CKMBINDEX,  TROPONINI in the last 168 hours. BNP (last 3 results) No results for input(s): PROBNP in the last 8760 hours. HbA1C: No results for input(s): HGBA1C in the last 72 hours. CBG: No results for input(s): GLUCAP in the last 168 hours. Lipid Profile: No results for input(s): CHOL, HDL, LDLCALC, TRIG, CHOLHDL, LDLDIRECT in the last 72 hours. Thyroid Function Tests: No results for input(s): TSH, T4TOTAL, FREET4, T3FREE, THYROIDAB in the last 72 hours. Anemia Panel: No results for input(s): VITAMINB12, FOLATE, FERRITIN, TIBC, IRON, RETICCTPCT in the last 72 hours. Sepsis Labs: No results for input(s): PROCALCITON, LATICACIDVEN in the last 168 hours.  Recent Results (from the past 240 hour(s))  SARS CORONAVIRUS 2 (TAT 6-24 HRS) Nasopharyngeal Nasopharyngeal Swab     Status: None   Collection Time: 08/31/19  8:32 PM   Specimen: Nasopharyngeal Swab  Result Value Ref Range Status   SARS Coronavirus 2 NEGATIVE NEGATIVE Final    Comment: (NOTE) SARS-CoV-2 target nucleic acids are NOT DETECTED. The SARS-CoV-2 RNA is generally detectable in upper and lower respiratory specimens during the acute phase of infection. Negative results do not preclude SARS-CoV-2 infection, do not Terrell out co-infections with other pathogens, and should not be used as the sole basis for treatment or other patient management decisions. Negative results must be combined with clinical observations, patient history, and epidemiological information. The expected result is Negative. Fact Sheet for Patients: SugarRoll.be Fact Sheet for Healthcare Providers: https://www.woods-mathews.com/ This test is not yet approved or cleared by the [place] FDA and  has been authorized for detection and/or diagnosis of SARS-CoV-2 by FDA under an Emergency Use Authorization (EUA). This EUA will remain  in effect (meaning this test can be used) for the duration of the COVID-19 declaration under  Section 56 4(b)(1) of the Act, 21 U.S.C. section 360bbb-3(b)(1), unless the authorization is terminated or revoked sooner. Performed at [facility], [place] [address], [place], [place] [address]          Radiology Studies: No results found.      Scheduled Meds:  amiodarone  200 mg Oral Daily   aspirin EC  325 mg Oral QPM   bupivacaine       calcitonin (salmon)  1 spray Alternating Nares Daily   Chlorhexidine Gluconate Cloth  6 each Topical  Daily   doxercalciferol  3 mcg Intravenous Q T,Th,Sa-HD   feeding supplement (PRO-STAT SUGAR FREE 64)  30 mL Oral BID   fentaNYL  1 patch Transdermal Q72H   fentaNYL  1 patch Transdermal Q72H   fentaNYL       hydrocortisone cream   Topical QID   isosorbide mononitrate  60 mg Oral Daily   lidocaine       melatonin  6 mg Oral QHS   midazolam       pravastatin  40 mg Oral QHS   senna-docusate  2 tablet Oral BID   sevelamer carbonate  1,600 mg Oral TID WC   sodium chloride flush  3 mL Intravenous Once   Continuous Infusions:  ceFAZolin     cefTRIAXone (ROCEPHIN)  IV 2 g (09/05/19 2202)   metronidazole 500 mg (09/06/19 1048)     LOS: 5 days    Flora Lipps, MD [facility] 09/06/2019, 1:25 PM

## 2019-09-06 NOTE — Sedation Documentation (Signed)
Pt arrived to IR nurses station. She is alert and oriented, npo, consent signed. Pt denies pain. Vitals stable.

## 2019-09-06 NOTE — Plan of Care (Signed)

## 2019-09-06 NOTE — Progress Notes (Signed)
[facility] Progress Note   Subjective: Had kypoplasty in IR this AM. Feels and looks much better. Alert, oriented X 2. Daughter at bedside.    Objective Vitals:   09/06/19 0955 09/06/19 1022 09/06/19 1045 09/06/19 1130  BP: (!) 161/69 (!) 123/46 (!) 122/45 (!) 117/49  Pulse: 83 69 65 68  Resp: 10 14 15 15   Temp:  (!) 97.4 F (36.3 C) (!) 97.2 F (36.2 C) 98.1 F (36.7 C)  TempSrc:  Oral Oral Oral  SpO2: 100% 99% 99% 98%  Weight:      Height:       Physical Exam General:Frail woman in NAD WOEHO:Z2,Y4 3/6 systolic M Lungs:CTAB A/P Abdomen:Active BS Extremities:No LE edema Dialysis Access:R AVG + bruit very bruised, scab beside outside edge on AVG (not on AVG)   Additional Objective Labs: Basic Metabolic Panel: Recent Labs  Lab 09/02/19 0654 09/03/19 0628 09/03/19 1136 09/04/19 0454 09/06/19 0349  NA 133*   < > 138 136 138  K 4.5   < > 3.8 3.7 3.5  CL 92*   < > 96* 94* 98  CO2 24   < > 26 25 30   GLUCOSE 90   < > 78 73 75  BUN 34*   < > 8 15 8   CREATININE 6.34*   < > 2.96* 3.93* 2.30*  CALCIUM 8.1*   < > 7.6* 7.7* 7.9*  PHOS 7.7*  --  4.1  --  3.3   < > = values in this interval not displayed.   Liver Function Tests: Recent Labs  Lab 08/31/19 1300 08/31/19 1300 09/02/19 0654 09/03/19 1136 09/06/19 0349  AST 24  --   --   --   --   ALT 15  --   --   --   --   ALKPHOS 102  --   --   --   --   BILITOT 0.7  --   --   --   --   PROT 6.8  --   --   --   --   ALBUMIN 3.1*   < > 2.4* 2.4* 2.1*   < > = values in this interval not displayed.   Recent Labs  Lab 08/31/19 1300  LIPASE 27   CBC: Recent Labs  Lab 09/02/19 0654 09/02/19 0654 09/03/19 0628 09/03/19 0628 09/03/19 1136 09/04/19 0454 09/06/19 0349  WBC 12.6*   < > 8.6   < > 11.0* 7.0 9.3  HGB 11.4*   < > 10.2*   < > 11.3* 10.0* 10.5*  HCT 35.6*   < > 31.9*   < > 35.1* 31.3* 33.4*  MCV 97.0  --  96.1  --  95.1 98.4 99.1  PLT 186   < > 177   < > 188 208 191   < > =  values in this interval not displayed.   Blood Culture    Component Value Date/Time   SDES BLOOD LEFT ANTECUBITAL 07/18/2018 2250   SPECREQUEST  07/18/2018 2250    BOTTLES DRAWN AEROBIC AND ANAEROBIC Blood Culture adequate volume   CULT  07/18/2018 2250    NO GROWTH 5 DAYS Performed at [facility], [place] [address], [place], [place] [address]    REPTSTATUS 07/23/2018 FINAL 07/18/2018 2250    Cardiac Enzymes: No results for input(s): CKTOTAL, CKMB, CKMBINDEX, TROPONINI in the last 168 hours. CBG: No results for input(s): GLUCAP in the last 168 hours. Iron Studies: No results for input(s):  IRON, TIBC, TRANSFERRIN, FERRITIN in the last 72 hours. @lablastinr3 @ Studies/Results: No results found. Medications: . ceFAZolin    . cefTRIAXone (ROCEPHIN)  IV 2 g (09/05/19 2202)  . metronidazole 500 mg (09/06/19 1048)   . amiodarone  200 mg Oral Daily  . aspirin EC  325 mg Oral QPM  . bupivacaine      . calcitonin (salmon)  1 spray Alternating Nares Daily  . Chlorhexidine Gluconate Cloth  6 each Topical Daily  . doxercalciferol  3 mcg Intravenous Q T,Th,Sa-HD  . feeding supplement (PRO-STAT SUGAR FREE 64)  30 mL Oral BID  . fentaNYL  1 patch Transdermal Q72H  . fentaNYL  1 patch Transdermal Q72H  . fentaNYL      . hydrocortisone cream   Topical QID  . isosorbide mononitrate  60 mg Oral Daily  . lidocaine      . melatonin  6 mg Oral QHS  . midazolam      . pravastatin  40 mg Oral QHS  . senna-docusate  2 tablet Oral BID  . sevelamer carbonate  1,600 mg Oral TID WC  . sodium chloride flush  3 mL Intravenous Once     Dialysis Orders: MTTS at [facility] 4hr, 350/600, EDW 51kg, 2K/2Ca, AVG, -heparin 1400units IVbolus - Hectoral 26mcg IV q HD - NoESA, Hgb >11  Assessment/Plan: 1. Acute sigmoid diverticulitis: On Ceftriaxone + Flagyl.  2. Back pain: New T12 compression Fx on imaging. Pain control per primary. On nasal calcitonin - watch Ca levels.IR consulted for T12  kyphoplasty which was done today. 3. ESRD:MTTS schedule recently - Next HD tomorrow.  4. Hypertension/volume:BPStable today.Minimal UF with HD yesterday. No edema. UF as tolerated tomorrow.  5. Anemia:Hgb 10.2- follow without ESA for now. 6. Metabolic bone disease:Ca ok, Phoshigh. Continue homebindersand VDRA. 7. Nutrition:Alb low - will addpro-stat. 8. COPD 9. Confusion/delirium: ?related to pain medications. Defer to primary team. 10. GOC: Appreciate palliative care consult. Now DNR. Overall decline in mental and functional status at home. Ongoing discussions about dialysis.Discussed with palliative care yesterday-pt does not wish to stop HD at present.   Valerie Tatlock H. Janmarie Smoot NP-C 09/06/2019, 12:11 PM  [employer] [phone]

## 2019-09-06 NOTE — Progress Notes (Signed)
NIR.  Patient was scheduled for kyphoplasty/vertebroplasty today with Dr. Karenann Cai. Upon further review of imaging, it appears that MRI scan was mislabeled- per Dr. Karenann Cai fracture is T11. Patient was consented for an image-guided T11 kyphoplasty/vertebroplasty- signed and in chart.  Please call NIR with questions/concerns.   Valerie Graff Amear Strojny, PA-C 09/06/2019, 8:28 AM

## 2019-09-07 DIAGNOSIS — R531 Weakness: Secondary | ICD-10-CM

## 2019-09-07 LAB — CBC
HCT: 31.6 % — ABNORMAL LOW (ref 36.0–46.0)
Hemoglobin: 9.7 g/dL — ABNORMAL LOW (ref 12.0–15.0)
MCH: 30.6 pg (ref 26.0–34.0)
MCHC: 30.7 g/dL (ref 30.0–36.0)
MCV: 99.7 fL (ref 80.0–100.0)
Platelets: 207 10*3/uL (ref 150–400)
RBC: 3.17 MIL/uL — ABNORMAL LOW (ref 3.87–5.11)
RDW: 15.8 % — ABNORMAL HIGH (ref 11.5–15.5)
WBC: 7 10*3/uL (ref 4.0–10.5)
nRBC: 0 % (ref 0.0–0.2)

## 2019-09-07 LAB — RENAL FUNCTION PANEL
Albumin: 1.9 g/dL — ABNORMAL LOW (ref 3.5–5.0)
Albumin: 2.1 g/dL — ABNORMAL LOW (ref 3.5–5.0)
Anion gap: 13 (ref 5–15)
Anion gap: 10 (ref 5–15)
BUN: 23 mg/dL (ref 8–23)
BUN: 8 mg/dL (ref 8–23)
CO2: 24 mmol/L (ref 22–32)
CO2: 28 mmol/L (ref 22–32)
Calcium: 7.9 mg/dL — ABNORMAL LOW (ref 8.9–10.3)
Calcium: 7.9 mg/dL — ABNORMAL LOW (ref 8.9–10.3)
Chloride: 99 mmol/L (ref 98–111)
Chloride: 99 mmol/L (ref 98–111)
Creatinine, Ser: 3.67 mg/dL — ABNORMAL HIGH (ref 0.44–1.00)
Creatinine, Ser: 1.83 mg/dL — ABNORMAL HIGH (ref 0.44–1.00)
GFR calc Af Amer: 12 mL/min — ABNORMAL LOW (ref >60)
GFR calc Af Amer: 29 mL/min — ABNORMAL LOW (ref >60)
GFR calc non Af Amer: 11 mL/min — ABNORMAL LOW (ref >60)
GFR calc non Af Amer: 25 mL/min — ABNORMAL LOW (ref >60)
Glucose, Bld: 97 mg/dL (ref 70–99)
Glucose, Bld: 99 mg/dL (ref 70–99)
Phosphorus: 4.4 mg/dL (ref 2.5–4.6)
Phosphorus: 1.9 mg/dL — ABNORMAL LOW (ref 2.5–4.6)
Potassium: 2.9 mmol/L — ABNORMAL LOW (ref 3.5–5.1)
Potassium: 4.6 mmol/L (ref 3.5–5.1)
Sodium: 136 mmol/L (ref 135–145)
Sodium: 137 mmol/L (ref 135–145)

## 2019-09-07 LAB — SURGICAL PATHOLOGY

## 2019-09-07 LAB — SARS CORONAVIRUS 2 (TAT 6-24 HRS): SARS Coronavirus 2: NEGATIVE

## 2019-09-07 MED ORDER — HEPARIN SODIUM (PORCINE) 1000 UNIT/ML DIALYSIS: 20.0000 [IU]/kg | INTRAMUSCULAR | Status: DC | PRN

## 2019-09-07 MED ORDER — SODIUM CHLORIDE 0.9 % IV SOLN: 100.0000 mL | INTRAVENOUS | Status: DC | PRN

## 2019-09-07 MED ORDER — DOXERCALCIFEROL 4 MCG/2ML IV SOLN
INTRAVENOUS | Status: AC
  Administered 2019-09-07: 3 ug via INTRAVENOUS
  Filled 2019-09-07: qty 2

## 2019-09-07 MED ORDER — POTASSIUM CHLORIDE 20 MEQ PO PACK
40.0000 meq | PACK | Freq: Once | ORAL | Status: AC
  Administered 2019-09-07: 13:00:00 40 meq via ORAL
  Filled 2019-09-07: qty 2

## 2019-09-07 MED ORDER — DARBEPOETIN ALFA 25 MCG/0.42ML IJ SOSY
PREFILLED_SYRINGE | INTRAMUSCULAR | Status: AC
  Administered 2019-09-07: 25 ug via INTRAVENOUS
  Filled 2019-09-07: qty 0.42

## 2019-09-07 MED ORDER — PENTAFLUOROPROP-TETRAFLUOROETH EX AERO: 1.0000 "application " | INHALATION_SPRAY | CUTANEOUS | Status: DC | PRN

## 2019-09-07 MED ORDER — OXYCODONE HCL 5 MG PO TABS
ORAL_TABLET | ORAL | Status: AC
  Administered 2019-09-07: 10 mg via ORAL
  Filled 2019-09-07: qty 2

## 2019-09-07 MED ORDER — LIDOCAINE HCL (PF) 1 % IJ SOLN: 5.0000 mL | INTRAMUSCULAR | Status: DC | PRN

## 2019-09-07 MED ORDER — DARBEPOETIN ALFA 25 MCG/0.42ML IJ SOSY: 25.0000 ug | PREFILLED_SYRINGE | INTRAMUSCULAR | Status: DC

## 2019-09-07 MED ORDER — ALTEPLASE 2 MG IJ SOLR: 2.0000 mg | Freq: Once | INTRAMUSCULAR | Status: DC | PRN

## 2019-09-07 MED ORDER — HEPARIN SODIUM (PORCINE) 1000 UNIT/ML IJ SOLN
INTRAMUSCULAR | Status: AC
  Filled 2019-09-07: qty 1

## 2019-09-07 MED ORDER — HEPARIN SODIUM (PORCINE) 1000 UNIT/ML DIALYSIS
1000.0000 [IU] | INTRAMUSCULAR | Status: DC | PRN
  Administered 2019-09-07: 1100 [IU] via INTRAVENOUS_CENTRAL

## 2019-09-07 MED ORDER — LIDOCAINE-PRILOCAINE 2.5-2.5 % EX CREA: 1.0000 "application " | TOPICAL_CREAM | CUTANEOUS | Status: DC | PRN

## 2019-09-07 NOTE — Progress Notes (Signed)
Physical Therapy Treatment Patient Details Name: Valerie Terrell MRN: [id number] DOB: 1932/10/19 Today's Date: 09/07/2019    History of Present Illness Valerie Terrell is a 86 y.o. female with history of ESRD on hemodialysis on Tuesday Thursday Saturdays who was recently admitted for fluid overload and discharged on August 17, 2019 presents to the ER because of abdominal pain. CT showed diverticulitis of sigmoid colon and new T12 compression fracture, further imaging showed the fx was actually T11 and s/p kyphoplasty on 09/06/19. Started on ceftriaxone and Flagyl for diverticulitis.    PT Comments    Pt with improved pain tolerance today, able to tolerate out of bed to chair. Requiring up to two person moderate assist for functional mobility. Performed chair level exercises for LE strengthening and postural re-education. Pt continues with poor balance, cognition, endurance, and significant debility/deconditioning. Presents as a high fall risk based on history of recurrent falls and deficits listed above. Continue to recommend SNF for ongoing Physical Therapy.      Follow Up Recommendations  SNF;Supervision/Assistance - 24 hour     Equipment Recommendations  Wheelchair (measurements PT);Wheelchair cushion (measurements PT)    Recommendations for Other Services       Precautions / Restrictions Precautions Precautions: Fall Restrictions Weight Bearing Restrictions: No    Mobility  Bed Mobility Overal bed mobility: Needs Assistance Bed Mobility: Rolling;Sidelying to Sit Rolling: Min assist Sidelying to sit: Mod assist       General bed mobility comments: MinA to roll onto L side, modA for trunk elevation to upright  Transfers Overall transfer level: Needs assistance Equipment used: Rolling walker (2 wheeled) Transfers: Sit to/from [facility] Sit to Stand: Mod assist;+2 physical assistance Stand pivot transfers: Min assist;+2 physical assistance       General  transfer comment: ModA + 2 to rise from bed. Pt placing left forearm on walker, significant trunk/hip flexion. Taking pivotal steps over to recliner with increased time; cues for sequencing/direction and upright posture.  Ambulation/Gait                 Stairs             Wheelchair Mobility    Modified Rankin (Stroke Patients Only)       Balance Overall balance assessment: Needs assistance Sitting-balance support: No upper extremity supported;Feet supported Sitting balance-Leahy Scale: Fair     Standing balance support: Bilateral upper extremity supported Standing balance-Leahy Scale: Poor                              Cognition Arousal/Alertness: Awake/alert Behavior During Therapy: Flat affect Overall Cognitive Status: Impaired/Different from baseline Area of Impairment: Orientation;Following commands;Safety/judgement;Problem solving;Attention                 Orientation Level: Disoriented to;Time Current Attention Level: Focused   Following Commands: Follows one step commands inconsistently Safety/Judgement: Decreased awareness of safety;Decreased awareness of deficits   Problem Solving: Slow processing;Decreased initiation;Difficulty sequencing;Requires verbal cues;Requires tactile cues General Comments: Pt alert, not oriented to time. She is very distractable and unable to do more than 3 reps of an exercise before forgetting what she is doing and playing with her phone. Needs max cues to attend.      Exercises General Exercises - Lower Extremity Long Arc Quad: Both;5 reps;Seated Hip Flexion/Marching: Both;10 reps;Seated Other Exercises Other Exercises: Seated: scapular retractions x 5, backward shoulder rolls x 5    General Comments  Pertinent Vitals/Pain Pain Assessment: Faces Faces Pain Scale: Hurts a little bit Pain Location: back Pain Descriptors / Indicators: Grimacing;Guarding Pain Intervention(s): Monitored  during session    Home Living                      Prior Function            PT Goals (current goals can now be found in the care plan section) Acute Rehab PT Goals Patient Stated Goal: did not state Potential to Achieve Goals: Fair Progress towards PT goals: Progressing toward goals    Frequency    Min 3X/week      PT Plan Current plan remains appropriate    Co-evaluation              AM-PAC PT "6 Clicks" Mobility   Outcome Measure  Help needed turning from your back to your side while in a flat bed without using bedrails?: A Little Help needed moving from lying on your back to sitting on the side of a flat bed without using bedrails?: A Lot Help needed moving to and from a bed to a chair (including a wheelchair)?: A Little Help needed standing up from a chair using your arms (e.g., wheelchair or bedside chair)?: A Lot Help needed to walk in hospital room?: A Lot Help needed climbing 3-5 steps with a railing? : Total 6 Click Score: 13    End of Session Equipment Utilized During Treatment: Gait belt Activity Tolerance: Patient tolerated treatment well Patient left: in chair;with call bell/phone within reach;with family/visitor present Nurse Communication: Mobility status PT Visit Diagnosis: Unsteadiness on feet (R26.81);Other abnormalities of gait and mobility (R26.89);Pain;Difficulty in walking, not elsewhere classified (R26.2) Pain - part of body: (back)     Time: 1450-1520 PT Time Calculation (min) (ACUTE ONLY): 30 min  Charges:  $Therapeutic Exercise: 8-22 mins $Therapeutic Activity: 8-22 mins                       Wyona Almas, PT, DPT Acute Rehabilitation Services Pager [phone] Office [phone]    Deno Etienne 09/07/2019, 5:05 PM

## 2019-09-07 NOTE — Plan of Care (Signed)

## 2019-09-07 NOTE — Plan of Care (Signed)
  Problem: Education: Goal: Knowledge of General Education information will improve Description: Including pain rating scale, medication(s)/side effects and non-pharmacologic comfort measures 09/07/2019 2330 by Cephus Shelling, RN Outcome: Progressing 09/07/2019 2323 by Cephus Shelling, RN Outcome: Progressing   Problem: Health Behavior/Discharge Planning: Goal: Ability to manage health-related needs will improve 09/07/2019 2330 by Cephus Shelling, RN Outcome: Progressing 09/07/2019 2323 by Cephus Shelling, RN Outcome: Progressing   Problem: Activity: Goal: Risk for activity intolerance will decrease 09/07/2019 2330 by Cephus Shelling, RN Outcome: Progressing 09/07/2019 2323 by Cephus Shelling, RN Outcome: Progressing   Problem: Coping: Goal: Level of anxiety will decrease 09/07/2019 2330 by Cephus Shelling, RN Outcome: Progressing 09/07/2019 2323 by Cephus Shelling, RN Outcome: Progressing   Problem: Pain Managment: Goal: General experience of comfort will improve 09/07/2019 2330 by Cephus Shelling, RN Outcome: Progressing 09/07/2019 2323 by Cephus Shelling, RN Outcome: Progressing   Problem: Safety: Goal: Ability to remain free from injury will improve 09/07/2019 2330 by Cephus Shelling, RN Outcome: Progressing 09/07/2019 2323 by Cephus Shelling, RN Outcome: Progressing   Problem: Skin Integrity: Goal: Risk for impaired skin integrity will decrease 09/07/2019 2330 by Cephus Shelling, RN Outcome: Progressing 09/07/2019 2323 by Cephus Shelling, RN Outcome: Progressing

## 2019-09-07 NOTE — Plan of Care (Signed)
  Problem: Education: Goal: Knowledge of General Education information will improve Description: Including pain rating scale, medication(s)/side effects and non-pharmacologic comfort measures Outcome: Progressing   Problem: Health Behavior/Discharge Planning: Goal: Ability to manage health-related needs will improve Outcome: Progressing   Problem: Activity: Goal: Risk for activity intolerance will decrease Outcome: Progressing   Problem: Coping: Goal: Level of anxiety will decrease Outcome: Progressing   Problem: Pain Managment: Goal: General experience of comfort will improve Outcome: Progressing   Problem: Safety: Goal: Ability to remain free from injury will improve Outcome: Progressing   Problem: Skin Integrity: Goal: Risk for impaired skin integrity will decrease Outcome: Progressing   

## 2019-09-07 NOTE — TOC Progression Note (Signed)
Transition of Care [facility]) - Progression Note    Patient Details  Name: Valerie Terrell MRN: [id number] Date of Birth: Feb 09, 1933  Transition of Care [facility]) CM/SW Contact  Sharin Mons, RN Phone Number: [phone] 09/07/2019, 2:02 PM  Clinical Narrative:    Pt and pt's daughter have agreed for SNF placement. Bed offers shared and [facility] selected per pt. NCM made [place] admission aware and bed offer was extended. Insurance authorization pending. Pt will need updated COVID, MD and nurse aware.  TOC team will continue to monitor and follow ...  Expected Discharge Plan: [facility](declined SNF placement) Barriers to Discharge: No Barriers Identified  Expected Discharge Plan and Services Expected Discharge Plan: [facility](declined SNF placement)   Discharge Planning Services: CM Consult   Living arrangements for the past 2 months: Single Family Home                           HH Arranged: PT, OT, Nurse's Aide, Social Work [employer] Agency: [facility] [employer] [facility] [employer] Date [place]: 09/07/19 Time [place]: 1210 Representative spoke with at [place]: [place] ([place]) Interventions    Readmission Risk Interventions Readmission Risk Prevention Plan 10/06/2018  Transportation Screening Complete  Medication Review Press photographer) Complete  PCP or Specialist appointment within 3-5 days of discharge Complete  HRI or [place] Complete  SW Recovery Care/Counseling Consult Complete  [place] Complete  Some recent data might be hidden

## 2019-09-07 NOTE — Progress Notes (Signed)
Pt currently has a stage 1 on sacrum, during skin assessment upon returning from dialysis, RN found a darker red spot than the surrounding area, skin still intact, non-blanchable, measuring length=1.3cm width=0.9cm. Charge RN called to room to assess. Sacral area cleansed, foam dressing placed. WOC consult placed.

## 2019-09-07 NOTE — Progress Notes (Signed)
SATURATION QUALIFICATIONS: (This note is used to comply with regulatory documentation for home oxygen)  Patient Saturations on Room Air at Rest = 92%  Patient Saturations on Room Air while Ambulating = 89%  Patient Saturations on 2 Liters of oxygen while Ambulating = 98%  Please briefly explain why patient needs home oxygen: To maintain oxygen saturations > 90% when mobilizing.    Wyona Almas, PT, DPT Acute Rehabilitation Services Pager [phone] Office [phone]

## 2019-09-07 NOTE — Progress Notes (Signed)
PROGRESS NOTE    Valerie Terrell  OQH:[id number] DOB: 18-Feb-1933 DOA: 08/31/2019 PCP: Tammi Sou, MD    Brief Narrative:   Valerie Terrell is a 86 y.o. female with history of ESRD on hemodialysis on Tuesday, Thursday, Saturdays who was recently admitted for fluid overload and discharged on August 17, 2019 presented to the ER because of abdominal pain.  Patient stated that she had been having abdominal pain generalized for the last 1 week which had been gradually worsening.   In the ER, CT scan of the abdomen pelvis shows diverticulitis of sigmoid colon and also a new T12 compression fracture.  Patient was able to move extremities and denied any incontinence of urine or bowel.  Labs showed WBC count of 18, potassium 3.8 creatinine 4.2 LFTs normal except for albumin of 3.1.  EKG showed normal sinus rhythm.  Covid test was negative.  On exam abdomen was benign. Patient was able to move all extremities.  Patient was started on ceftriaxone and Flagyl for diverticulitis and was admitted to the hospital.  Assessment & Plan:   Principal Problem:   Diverticulitis Active Problems:   PAF (paroxysmal atrial fibrillation) (HCC)   Essential hypertension   History of CVA (cerebrovascular accident)   Cardiac pacemaker   Non-ischemic cardiomyopathy ([place])   ESRD on dialysis ([place])   ESRD (end stage renal disease) ([place])   Compression fracture of T11 vertebra ([place])   Acute diverticulitis   Palliative care by specialist   Goals of care, counseling/discussion   DNR (do not resuscitate)   Inadequate pain control   Acute sigmoid diverticulitis Improved on Rocephin and Flagyl.  Tolerating diet.  No leukocytosis or fever at this time.    CT abdomen/pelvis notable for slight differential wall thickening sigmoid colon with pericolonic fat stranding consistent with acute diverticulitis.  New T11 compression fracture with intractable pain, Hx of chronic pain Status post IR guided kyphoplasty,  on 09/06/2019.  CT abdomen/pelvis notable for T12 compression fracture (reviewed with radiology and is T11) which appears new with less than 25% height loss.  Continue analgesia as necessary..  Physical therapy recommending SNF placement, with the patient and daughter again today.  It appears that the family is looking at skilled nursing facility at this time.    ESRD on HD MTTS Currently receiving  hemodialysis 4 days/week.  Dialyzes at [facility] dialysis center.  Missed dialysis on 08/31/2019.  Seen by nephrology.  Continue hemodialysis while in the hospital.  Patient was seen during hemodialysis.  History of tachybrady syndrome s/p PPM, Paroxysmal atrial fibrillation Rate controlled.  Continue amiodarone.  Not on anticoagulation due to frequent falls.  Hyperlipidemia continue statin.  History of nonischemic cardiomyopathy/Essential hypertension Transthoracic echocardiogram on 11/2017 with EF 55-60% (improved from 35% prior), grade 2 diastolic dysfunction, mild/moderate MR, LA moderately dilated, mild TR. we will continue Imdur, fluid restriction.  Confusion with agitation Improved, possibility of underlying dementia hospital induced delirium and narcotics.  Sacral stage I pressure ulcer, present on admission --Continue local wound care, continue offloading frequently  Pressure Injury 08/16/19 Sacrum Medial Stage 1 -  Intact skin with non-blanchable redness of a localized area usually over a bony prominence. (Active)  08/16/19 1917  Location: Sacrum  Location Orientation: Medial  Staging: Stage 1 -  Intact skin with non-blanchable redness of a localized area usually over a bony prominence.  Wound Description (Comments):   Present on Admission: Yes    Ethics Seen by palliative care as outpatient.  Currently DNR.  Family wishing  to continue with dialysis.  DVT prophylaxis: Heparin subcu  Code Status: DNR  Family Communication:   None today.   Disposition Plan:  Status is: Inpatient  Remains  inpatient appropriate because:O Unsafe d/c plan and Inpatient level of care appropriate due to severity of illness status post kyphoplasty, hemodialysis today, family wishing for skilled nursing facility at this time.  Transition of care on board  Dispo: The patient is from: Home              Anticipated d/c is to:  SNF               Anticipated d/c date is: 2 days              Patient currently is  medically stable to d/c.   Consultants:   IR  Procedures:   T11 kyphoplasty: 4/21  Antimicrobials:   Ceftriaxone 4/15>>  Flagyl 4/15>>   Subjective: Today, patient was seen and examined at bedside during hemodialysis.  Denies overt back pain.  Denies chest pain, palpitation, shortness of breath.   Objective: Vitals:   09/07/19 1030 09/07/19 1054 09/07/19 1141 09/07/19 1306  BP: (!) 92/45 (!) 105/50 (!) 83/42 (!) 105/35  Pulse: 67 70 67 71  Resp:  19 16 17   Temp:  98 F (36.7 C) 98.3 F (36.8 C) 98.1 F (36.7 C)  TempSrc:  Oral  Oral  SpO2:  99% 99% 96%  Weight:  52.8 kg    Height:        Intake/Output Summary (Last 24 hours) at 09/07/2019 1309 Last data filed at 09/07/2019 1054 Gross per 24 hour  Intake --  Output 1383 ml  Net -1383 ml   Filed Weights   09/06/19 0458 09/07/19 0720 09/07/19 1054  Weight: 53.3 kg 54.4 kg 52.8 kg    Examination:  General:  Average built, not in obvious distress, on nasal cannula oxygen at 2 L/min, communicative, appears frail and deconditioned HENT: Normocephalic, pupils equally reacting to light and accommodation.  No scleral pallor or icterus noted. Oral mucosa is moist.  Chest:  Diminished breath sounds bilaterally. No crackles or wheezes.  CVS: S1 &S2 heard.  Systolic murmur regular rate and rhythm. Abdomen: Soft, nontender, nondistended.  Bowel sounds are heard.   Extremities: No cyanosis, clubbing with chronic venous stasis of the bilateral lower extremities.  Right upper extremity AV graft. Psych: Alert, awake and  communicative. CNS:  No cranial nerve deficits.  Power equal in all extremities.   Skin: Warm and dry.  No rashes noted.   Data Reviewed: I have personally reviewed following labs and imaging studies  CBC: Recent Labs  Lab 09/03/19 0628 09/03/19 1136 09/04/19 0454 09/06/19 0349 09/07/19 0729  WBC 8.6 11.0* 7.0 9.3 7.0  HGB 10.2* 11.3* 10.0* 10.5* 9.7*  HCT 31.9* 35.1* 31.3* 33.4* 31.6*  MCV 96.1 95.1 98.4 99.1 99.7  PLT 177 188 208 191 253   Basic Metabolic Panel: Recent Labs  Lab 09/02/19 0654 09/02/19 0654 09/03/19 0628 09/03/19 1136 09/04/19 0454 09/06/19 0349 09/07/19 0729  NA 133*   < > 135 138 136 138 136  K 4.5   < > 2.7* 3.8 3.7 3.5 2.9*  CL 92*   < > 95* 96* 94* 98 99  CO2 24   < > 27 26 25 30 24   GLUCOSE 90   < > 78 78 73 75 97  BUN 34*   < > 15 8 15 8 23   CREATININE 6.34*   < >  3.30* 2.96* 3.93* 2.30* 3.67*  CALCIUM 8.1*   < > 7.1* 7.6* 7.7* 7.9* 7.9*  MG 2.2  --   --   --  1.9 1.8  --   PHOS 7.7*  --   --  4.1  --  3.3 4.4   < > = values in this interval not displayed.   GFR: Estimated Creatinine Clearance: 6.2 mL/min (A) (by C-G formula based on SCr of 3.67 mg/dL (H)). Liver Function Tests: Recent Labs  Lab 09/02/19 0654 09/03/19 1136 09/06/19 0349 09/07/19 0729  ALBUMIN 2.4* 2.4* 2.1* 1.9*   No results for input(s): LIPASE, AMYLASE in the last 168 hours. No results for input(s): AMMONIA in the last 168 hours. Coagulation Profile: No results for input(s): INR, PROTIME in the last 168 hours. Cardiac Enzymes: No results for input(s): CKTOTAL, CKMB, CKMBINDEX, TROPONINI in the last 168 hours. BNP (last 3 results) No results for input(s): PROBNP in the last 8760 hours. HbA1C: No results for input(s): HGBA1C in the last 72 hours. CBG: No results for input(s): GLUCAP in the last 168 hours. Lipid Profile: No results for input(s): CHOL, HDL, LDLCALC, TRIG, CHOLHDL, LDLDIRECT in the last 72 hours. Thyroid Function Tests: No results for  input(s): TSH, T4TOTAL, FREET4, T3FREE, THYROIDAB in the last 72 hours. Anemia Panel: No results for input(s): VITAMINB12, FOLATE, FERRITIN, TIBC, IRON, RETICCTPCT in the last 72 hours. Sepsis Labs: No results for input(s): PROCALCITON, LATICACIDVEN in the last 168 hours.  Recent Results (from the past 240 hour(s))  SARS CORONAVIRUS 2 (TAT 6-24 HRS) Nasopharyngeal Nasopharyngeal Swab     Status: None   Collection Time: 08/31/19  8:32 PM   Specimen: Nasopharyngeal Swab  Result Value Ref Range Status   SARS Coronavirus 2 NEGATIVE NEGATIVE Final    Comment: (NOTE) SARS-CoV-2 target nucleic acids are NOT DETECTED. The SARS-CoV-2 RNA is generally detectable in upper and lower respiratory specimens during the acute phase of infection. Negative results do not preclude SARS-CoV-2 infection, do not rule out co-infections with other pathogens, and should not be used as the sole basis for treatment or other patient management decisions. Negative results must be combined with clinical observations, patient history, and epidemiological information. The expected result is Negative. Fact Sheet for Patients: SugarRoll.be Fact Sheet for Healthcare Providers: https://www.woods-mathews.com/ This test is not yet approved or cleared by the [place] FDA and  has been authorized for detection and/or diagnosis of SARS-CoV-2 by FDA under an Emergency Use Authorization (EUA). This EUA will remain  in effect (meaning this test can be used) for the duration of the COVID-19 declaration under Section 56 4(b)(1) of the Act, 21 U.S.C. section 360bbb-3(b)(1), unless the authorization is terminated or revoked sooner. Performed at [facility], [place] [address] [place], [place] [address]          Radiology Studies: IR [facility] THORACIC WITH BONE BIOPSY  Result Date: 09/06/2019 INDICATION: Amaziah Ghosh is a 86 year old. Female with a past medical history of  hypertension, hyperlipidemia, sick sinus syndrome, HF, paroxysmal atrial fibrillation not on chronic anticoagulation, [place] 2016, CVA 2017, ESRD on HD, nephrolithiasis, osteopenia, and multiple fractures (thoracic compression fractures, right hip fracture, metatarsal fracture). She presented to [facility] ED 09/01/2019 with complaints of back and abdominal pain. In ED, CT abdomen/pelvis revealed diverticulitis and a new T11 compression fracture. EXAM: Fluoroscopy guided T11 kyphoplasty and biopsy COMPARISON:  MRI of the thoracic spine July 24, 2018; CT of the abdomen August 31, 2019 MEDICATIONS: As antibiotic prophylaxis, Ancef 2  gm IV was ordered pre-procedure and administered intravenously within 1 hour of incision. ANESTHESIA/SEDATION: Moderate (conscious) sedation was employed during this procedure. A total of Versed 1.5 mg and Fentanyl 50 mcg was administered intravenously. Moderate Sedation Time: 45 minutes. The patient's level of consciousness and vital signs were monitored continuously by radiology nursing throughout the procedure under my direct supervision. FLUOROSCOPY TIME:  Fluoroscopy Time: 12 minutes 6 seconds (923 mGy) COMPLICATIONS: None immediate. PROCEDURE: Following a full explanation of the procedure along with the potential associated complications, an informed witnessed consent was obtained. The patient was placed in prone position on the angiography table. The thoracic spine region was prepped and draped in a sterile fashion. Under fluoroscopy, the the T11 vertebral body was delineated and the skin area was marked. The skin was infiltrated with a 1% Lidocaine approximately 3.5 cm lateral to the spinous process projection on the right. Using a 22-gauge spinal needle, the soft issue and the peripedicular space and periosteum were infiltrated with Bupivacaine 0.25%. A skin incision was made at the access site. Subsequently, an 11-gauge Kyphon trocar was inserted under fluoroscopic guidance until contact  with the pedicle was obtained. The trocar was advanced through the pedicle into the vertebral body with light hammer tapping. The diamond mandrill was removed and one core biopsy was obtained. The skin was infiltrated with a 1% Lidocaine approximately 3.5 cm lateral to the spinous process projection on the left. Using a 22-gauge spinal needle, the soft issue and the peripedicular space and periosteum were infiltrated with Bupivacaine 0.25%. A skin incision was made at the access site. Subsequently, an 11-gauge Kyphon trocar was inserted under fluoroscopic guidance until contact with the pedicle was obtained. The trocar was advanced through the pedicle into the vertebral body with light hammer tapping. The diamond mandrill was removed. A bone drill was coaxially advanced within the anterior third of the vertebral body and then exchanged for inflatable Kyphon balloons. These were centered within the mid-aspect of the vertebral body. The balloons were inflated to create a void to serve as a repository for the bone cement. Both balloons were deflated and through both cannulas, under continuous fluoroscopy guidance in the AP and lateral views. The vertebral body was filled with previously mixed polymethyl-methacrylate (PMMA) added to barium for opacification. Both cannulas were later removed. The access sites were cleaned and covered with a sterile bandage. IMPRESSION: Successful fluoroscopy-guided bilateral transpedicular approach for T11 vertebral body core bone biopsy and kyphoplasty for treatment of osteoporotic fragility fracture. Bone sample obtained was sent for pathology analysis. Electronically Signed   By: Pedro Earls M.D.   On: 09/06/2019 16:03        Scheduled Meds: . amiodarone  200 mg Oral Daily  . aspirin EC  325 mg Oral QPM  . calcitonin (salmon)  1 spray Alternating Nares Daily  . Chlorhexidine Gluconate Cloth  6 each Topical Daily  . darbepoetin (ARANESP) injection -  DIALYSIS  25 mcg Intravenous Q Thu-HD  . doxercalciferol  3 mcg Intravenous Q T,Th,Sa-HD  . feeding supplement (PRO-STAT SUGAR FREE 64)  30 mL Oral BID  . fentaNYL  1 patch Transdermal Q72H  . fentaNYL  1 patch Transdermal Q72H  . hydrocortisone cream   Topical QID  . isosorbide mononitrate  60 mg Oral Daily  . melatonin  6 mg Oral QHS  . potassium chloride  40 mEq Oral Once  . pravastatin  40 mg Oral QHS  . senna-docusate  2 tablet Oral BID  .  sevelamer carbonate  1,600 mg Oral TID WC  . sodium chloride flush  3 mL Intravenous Once   Continuous Infusions: . cefTRIAXone (ROCEPHIN)  IV 2 g (09/06/19 2030)  . metronidazole 500 mg (09/06/19 2353)     LOS: 6 days    Flora Lipps, MD [facility] 09/07/2019, 1:09 PM

## 2019-09-07 NOTE — Progress Notes (Signed)
[name], female   DOB: 1932/10/25, 86 y.o.   MRN: [id number]  This NP visited patient at the bedside as a follow up for palliative medicine needs and emotional support.  Meet daughter/Allison at bedside for continued GOCs discussion and education  Patient reports pain is currently  well controlled, s/p kyphoplasty however patient continued with weakness and dense nursing needs for ADL.  Discussion today is centered on next steps for transition of care,  SNF is being recommended  Education offered regarding diagnosis of ESRD and adult failure to thrive. Both patient and her daughter recognize the  seriousness of the medical condition.  Both patient and her daughter verbalize plan to continue to treat the treatable, continue dialysis and remain hopeful for improvement ;    ultimate goal is to return home.     Discussed with patient/daughter  the importance of continued conversation with each other  and the medical providers regarding overall plan of care and treatment options,  ensuring decisions are within the context of the patients values and GOCs.  Questions and concerns addressed   Discussed with attending and CMRN  PMT will continue to support holistically.  Total time spent on the unit was 25 minutes  Greater than 50% of the time was spent in counseling and coordination of care  Wadie Lessen NP  Palliative Medicine Team Team Phone # [phone] Pager [phone]

## 2019-09-07 NOTE — Progress Notes (Signed)
[facility] Progress Note   Subjective: Confused today but knows she is at [facility]. Issues with back pain. On HD tolerating well.   Objective Vitals:   09/07/19 0724 09/07/19 0730 09/07/19 0800 09/07/19 0830  BP: (!) 131/48 (!) 129/54 (!) 133/53 (!) 121/45  Pulse: 61 64 69 63  Resp:      Temp:      TempSrc:      SpO2:      Weight:      Height:       Physical Exam General:Frail woman in NAD [name],C1 3/6 systolic M Lungs:CTAB A/P Abdomen:Active BS Extremities:No LE edema Dialysis Access:R AVG very bruised, scab beside outside edge on AVG (not on AVG) cannulated    Additional Objective Labs: Basic Metabolic Panel: Recent Labs  Lab 09/03/19 1136 09/03/19 1136 09/04/19 0454 09/06/19 0349 09/07/19 0729  NA 138   < > 136 138 136  K 3.8   < > 3.7 3.5 2.9*  CL 96*   < > 94* 98 99  CO2 26   < > 25 30 24   GLUCOSE 78   < > 73 75 97  BUN 8   < > 15 8 23   CREATININE 2.96*   < > 3.93* 2.30* 3.67*  CALCIUM 7.6*   < > 7.7* 7.9* 7.9*  PHOS 4.1  --   --  3.3 4.4   < > = values in this interval not displayed.   Liver Function Tests: Recent Labs  Lab 08/31/19 1300 09/02/19 0654 09/03/19 1136 09/06/19 0349 09/07/19 0729  AST 24  --   --   --   --   ALT 15  --   --   --   --   ALKPHOS 102  --   --   --   --   BILITOT 0.7  --   --   --   --   PROT 6.8  --   --   --   --   ALBUMIN 3.1*   < > 2.4* 2.1* 1.9*   < > = values in this interval not displayed.   Recent Labs  Lab 08/31/19 1300  LIPASE 27   CBC: Recent Labs  Lab 09/03/19 0628 09/03/19 0628 09/03/19 1136 09/03/19 1136 09/04/19 0454 09/06/19 0349 09/07/19 0729  WBC 8.6   < > 11.0*   < > 7.0 9.3 7.0  HGB 10.2*   < > 11.3*   < > 10.0* 10.5* 9.7*  HCT 31.9*   < > 35.1*   < > 31.3* 33.4* 31.6*  MCV 96.1  --  95.1  --  98.4 99.1 99.7  PLT 177   < > 188   < > 208 191 207   < > = values in this interval not displayed.   Blood Culture    Component Value Date/Time   SDES BLOOD LEFT  ANTECUBITAL 07/18/2018 2250   SPECREQUEST  07/18/2018 2250    BOTTLES DRAWN AEROBIC AND ANAEROBIC Blood Culture adequate volume   CULT  07/18/2018 2250    NO GROWTH 5 DAYS Performed at [facility], [place] [address] [place], [place] [address]    REPTSTATUS 07/23/2018 FINAL 07/18/2018 2250    Cardiac Enzymes: No results for input(s): CKTOTAL, CKMB, CKMBINDEX, TROPONINI in the last 168 hours. CBG: No results for input(s): GLUCAP in the last 168 hours. Iron Studies: No results for input(s): IRON, TIBC, TRANSFERRIN, FERRITIN in the last 72 hours. @lablastinr3 @ Studies/Results: IR KYPHO THORACIC  WITH BONE BIOPSY  Result Date: 09/06/2019 INDICATION: Valerie Terrell is a 86 year old. Female with a past medical history of hypertension, hyperlipidemia, sick sinus syndrome, HF, paroxysmal atrial fibrillation not on chronic anticoagulation, Adams 2016, CVA 2017, ESRD on HD, nephrolithiasis, osteopenia, and multiple fractures (thoracic compression fractures, right hip fracture, metatarsal fracture). She presented to [facility] ED 09/01/2019 with complaints of back and abdominal pain. In ED, CT abdomen/pelvis revealed diverticulitis and a new T11 compression fracture. EXAM: Fluoroscopy guided T11 kyphoplasty and biopsy COMPARISON:  MRI of the thoracic spine July 24, 2018; CT of the abdomen August 31, 2019 MEDICATIONS: As antibiotic prophylaxis, Ancef 2 gm IV was ordered pre-procedure and administered intravenously within 1 hour of incision. ANESTHESIA/SEDATION: Moderate (conscious) sedation was employed during this procedure. A total of Versed 1.5 mg and Fentanyl 50 mcg was administered intravenously. Moderate Sedation Time: 45 minutes. The patient's level of consciousness and vital signs were monitored continuously by radiology nursing throughout the procedure under my direct supervision. FLUOROSCOPY TIME:  Fluoroscopy Time: 12 minutes 6 seconds (681 mGy) COMPLICATIONS: None immediate. PROCEDURE: Following a full  explanation of the procedure along with the potential associated complications, an informed witnessed consent was obtained. The patient was placed in prone position on the angiography table. The thoracic spine region was prepped and draped in a sterile fashion. Under fluoroscopy, the the T11 vertebral body was delineated and the skin area was marked. The skin was infiltrated with a 1% Lidocaine approximately 3.5 cm lateral to the spinous process projection on the right. Using a 22-gauge spinal needle, the soft issue and the peripedicular space and periosteum were infiltrated with Bupivacaine 0.25%. A skin incision was made at the access site. Subsequently, an 11-gauge Kyphon trocar was inserted under fluoroscopic guidance until contact with the pedicle was obtained. The trocar was advanced through the pedicle into the vertebral body with light hammer tapping. The diamond mandrill was removed and one core biopsy was obtained. The skin was infiltrated with a 1% Lidocaine approximately 3.5 cm lateral to the spinous process projection on the left. Using a 22-gauge spinal needle, the soft issue and the peripedicular space and periosteum were infiltrated with Bupivacaine 0.25%. A skin incision was made at the access site. Subsequently, an 11-gauge Kyphon trocar was inserted under fluoroscopic guidance until contact with the pedicle was obtained. The trocar was advanced through the pedicle into the vertebral body with light hammer tapping. The diamond mandrill was removed. A bone drill was coaxially advanced within the anterior third of the vertebral body and then exchanged for inflatable Kyphon balloons. These were centered within the mid-aspect of the vertebral body. The balloons were inflated to create a void to serve as a repository for the bone cement. Both balloons were deflated and through both cannulas, under continuous fluoroscopy guidance in the AP and lateral views. The vertebral body was filled with previously  mixed polymethyl-methacrylate (PMMA) added to barium for opacification. Both cannulas were later removed. The access sites were cleaned and covered with a sterile bandage. IMPRESSION: Successful fluoroscopy-guided bilateral transpedicular approach for T11 vertebral body core bone biopsy and kyphoplasty for treatment of osteoporotic fragility fracture. Bone sample obtained was sent for pathology analysis. Electronically Signed   By: Pedro Earls M.D.   On: 09/06/2019 16:03   Medications: . sodium chloride    . sodium chloride    . cefTRIAXone (ROCEPHIN)  IV 2 g (09/06/19 2030)  . metronidazole 500 mg (09/06/19 2353)   . amiodarone  200 mg Oral  Daily  . aspirin EC  325 mg Oral QPM  . calcitonin (salmon)  1 spray Alternating Nares Daily  . Chlorhexidine Gluconate Cloth  6 each Topical Daily  . doxercalciferol  3 mcg Intravenous Q T,Th,Sa-HD  . feeding supplement (PRO-STAT SUGAR FREE 64)  30 mL Oral BID  . fentaNYL  1 patch Transdermal Q72H  . fentaNYL  1 patch Transdermal Q72H  . heparin sodium (porcine)      . hydrocortisone cream   Topical QID  . isosorbide mononitrate  60 mg Oral Daily  . melatonin  6 mg Oral QHS  . pravastatin  40 mg Oral QHS  . senna-docusate  2 tablet Oral BID  . sevelamer carbonate  1,600 mg Oral TID WC  . sodium chloride flush  3 mL Intravenous Once     Dialysis Orders: MTTS at [facility] 4hr, 350/600, EDW 51kg, 2K/2Ca, AVG, -heparin 1400units IVbolus - Hectoral 43mcg IV q HD - NoESA, Hgb >11  Assessment/Plan: 1. Acute sigmoid diverticulitis: On Ceftriaxone + Flagyl.  2. Back pain: New T12 compression Fx on imaging. Pain control per primary. On nasal calcitonin - watch Ca levels.IR consulted for T12 kyphoplasty which was done 09/06/2019. 3. ESRD:MTTS schedule recently -HD today on schedule. Hypokalemia noted. K+ 2.9. On 4.0 K bath. Give KDUR 40 MEQ X 1 dose today post HD. RFP at 1600.  4. Hypertension/volume:BPStable today. No  edema. UF as tolerated.  5. Anemia:Hgb 9.7- Aranesp 25 mcg IV with HD today.  6. Metabolic bone disease:Ca ok, Phos at goal. Continue homebindersand VDRA. 7. Nutrition:Alb low - will addpro-stat. 8. COPD 9. Confusion/delirium: ?related to pain medications. Defer to primary team. 10.GOC: Appreciate palliative care consult. Now DNR. Overall decline in mental and functional status at home. Ongoing discussions about dialysis.Discussed with palliative care yesterday-pt does not wish to stop HD at present.   H.  NP-C 09/07/2019, 9:05 AM  [employer] [phone]

## 2019-09-08 DIAGNOSIS — Z950 Presence of cardiac pacemaker: Secondary | ICD-10-CM

## 2019-09-08 LAB — BASIC METABOLIC PANEL
Anion gap: 10 (ref 5–15)
BUN: 15 mg/dL (ref 8–23)
CO2: 24 mmol/L (ref 22–32)
Calcium: 8.2 mg/dL — ABNORMAL LOW (ref 8.9–10.3)
Chloride: 102 mmol/L (ref 98–111)
Creatinine, Ser: 2.59 mg/dL — ABNORMAL HIGH (ref 0.44–1.00)
GFR calc Af Amer: 19 mL/min — ABNORMAL LOW (ref >60)
GFR calc non Af Amer: 16 mL/min — ABNORMAL LOW (ref >60)
Glucose, Bld: 82 mg/dL (ref 70–99)
Potassium: 4.5 mmol/L (ref 3.5–5.1)
Sodium: 136 mmol/L (ref 135–145)

## 2019-09-08 LAB — CBC
HCT: 35 % — ABNORMAL LOW (ref 36.0–46.0)
Hemoglobin: 10.7 g/dL — ABNORMAL LOW (ref 12.0–15.0)
MCH: 30.1 pg (ref 26.0–34.0)
MCHC: 30.6 g/dL (ref 30.0–36.0)
MCV: 98.6 fL (ref 80.0–100.0)
Platelets: 225 10*3/uL (ref 150–400)
RBC: 3.55 MIL/uL — ABNORMAL LOW (ref 3.87–5.11)
RDW: 16.1 % — ABNORMAL HIGH (ref 11.5–15.5)
WBC: 6.7 10*3/uL (ref 4.0–10.5)
nRBC: 0 % (ref 0.0–0.2)

## 2019-09-08 LAB — MAGNESIUM: Magnesium: 1.8 mg/dL (ref 1.7–2.4)

## 2019-09-08 MED ORDER — PRO-STAT SUGAR FREE PO LIQD: 30.0000 mL | Freq: Two times a day (BID) | ORAL | 0 refills | Status: DC

## 2019-09-08 MED ORDER — ZINC OXIDE 40 % EX OINT
TOPICAL_OINTMENT | Freq: Four times a day (QID) | CUTANEOUS | Status: DC
  Administered 2019-09-08: 1 via TOPICAL
  Filled 2019-09-08: qty 57

## 2019-09-08 MED ORDER — HYDROCORTISONE 1 % EX CREA: TOPICAL_CREAM | Freq: Four times a day (QID) | CUTANEOUS | 0 refills | Status: AC

## 2019-09-08 MED ORDER — OXYCODONE HCL 10 MG PO TABS: 20.0000 mg | ORAL_TABLET | Freq: Four times a day (QID) | ORAL | 0 refills | Status: DC | PRN

## 2019-09-08 MED ORDER — MELATONIN 3 MG PO TABS: 6.0000 mg | ORAL_TABLET | Freq: Every evening | ORAL | 0 refills | Status: DC | PRN

## 2019-09-08 MED ORDER — CHLORHEXIDINE GLUCONATE CLOTH 2 % EX PADS
6.0000 | MEDICATED_PAD | Freq: Every day | CUTANEOUS | Status: DC
  Administered 2019-09-08: 15:00:00 6 via TOPICAL

## 2019-09-08 MED ORDER — RENA-VITE PO TABS
1.0000 | ORAL_TABLET | Freq: Every day | ORAL | Status: DC
  Administered 2019-09-08: 22:00:00 1 via ORAL
  Filled 2019-09-08: qty 1

## 2019-09-08 MED ORDER — POLYETHYLENE GLYCOL 3350 17 G PO PACK: 17.0000 g | PACK | Freq: Every day | ORAL | Status: AC | PRN

## 2019-09-08 MED ORDER — FENTANYL 50 MCG/HR TD PT72: 1.0000 | MEDICATED_PATCH | TRANSDERMAL | 0 refills | Status: AC

## 2019-09-08 NOTE — Progress Notes (Signed)
Pt has been on D9 of ceftriaxone/flagyl for her diverticulitis. Infection parameters are now normal. Ok to dc abx per Dr. Louanne Belton.  Onnie Boer, PharmD, BCIDP, AAHIVP, CPP Infectious Disease Pharmacist 09/08/2019 9:00 AM

## 2019-09-08 NOTE — Plan of Care (Signed)
  Problem: Safety: Goal: Ability to remain free from injury will improve Outcome: Progressing   

## 2019-09-08 NOTE — Progress Notes (Signed)
[facility] Progress Note   Subjective: Just got cleaned up after a "blow out" from a laxative.  Waiting on SNF approval. Hoping for [facility].  Objective Vitals:   09/07/19 1306 09/07/19 1949 09/08/19 0416 09/08/19 0746  BP: (!) 105/35 137/75 (!) 137/52 (!) 154/59  Pulse: 71 73 73 76  Resp: 17 17 17 16   Temp: 98.1 F (36.7 C) 98 F (36.7 C) 97.7 F (36.5 C) 97.6 F (36.4 C)  TempSrc: Oral Oral Oral Oral  SpO2: 96% 99% 93% 100%  Weight:      Height:       Physical Exam General:Frail woman in NAD LKGMW:N0,U7 3/6 systolic M Lungs:CTAB A/P Abdomen:Active BS Extremities:No LE edema Dialysis Access:R AVG bruised, scab beside outside edge on AVG (not on AVG) + bruit    Additional Objective Labs: Basic Metabolic Panel: Recent Labs  Lab 09/06/19 0349 09/06/19 0349 09/07/19 0729 09/07/19 1554 09/08/19 0502  NA 138   < > 136 137 136  K 3.5   < > 2.9* 4.6 4.5  CL 98   < > 99 99 102  CO2 30   < > 24 28 24   GLUCOSE 75   < > 97 99 82  BUN 8   < > 23 8 15   CREATININE 2.30*   < > 3.67* 1.83* 2.59*  CALCIUM 7.9*   < > 7.9* 7.9* 8.2*  PHOS 3.3  --  4.4 1.9*  --    < > = values in this interval not displayed.   Liver Function Tests: Recent Labs  Lab 09/06/19 0349 09/07/19 0729 09/07/19 1554  ALBUMIN 2.1* 1.9* 2.1*   No results for input(s): LIPASE, AMYLASE in the last 168 hours. CBC: Recent Labs  Lab 09/03/19 1136 09/03/19 1136 09/04/19 0454 09/04/19 0454 09/06/19 0349 09/07/19 0729 09/08/19 0502  WBC 11.0*   < > 7.0   < > 9.3 7.0 6.7  HGB 11.3*   < > 10.0*   < > 10.5* 9.7* 10.7*  HCT 35.1*   < > 31.3*   < > 33.4* 31.6* 35.0*  MCV 95.1  --  98.4  --  99.1 99.7 98.6  PLT 188   < > 208   < > 191 207 225   < > = values in this interval not displayed.   Blood Culture    Component Value Date/Time   SDES BLOOD LEFT ANTECUBITAL 07/18/2018 2250   SPECREQUEST  07/18/2018 2250    BOTTLES DRAWN AEROBIC AND ANAEROBIC Blood Culture  adequate volume   CULT  07/18/2018 2250    NO GROWTH 5 DAYS Performed at [facility], [place] [address] [place], [place] [address]    REPTSTATUS 07/23/2018 FINAL 07/18/2018 2250    Cardiac Enzymes: No results for input(s): CKTOTAL, CKMB, CKMBINDEX, TROPONINI in the last 168 hours. CBG: No results for input(s): GLUCAP in the last 168 hours. Iron Studies: No results for input(s): IRON, TIBC, TRANSFERRIN, FERRITIN in the last 72 hours. @lablastinr3 @ Studies/Results: No results found. Medications:  . amiodarone  200 mg Oral Daily  . aspirin EC  325 mg Oral QPM  . calcitonin (salmon)  1 spray Alternating Nares Daily  . Chlorhexidine Gluconate Cloth  6 each Topical Daily  . darbepoetin (ARANESP) injection - DIALYSIS  25 mcg Intravenous Q Thu-HD  . doxercalciferol  3 mcg Intravenous Q T,Th,Sa-HD  . feeding supplement (PRO-STAT SUGAR FREE 64)  30 mL Oral BID  . fentaNYL  1 patch Transdermal Q72H  .  fentaNYL  1 patch Transdermal Q72H  . hydrocortisone cream   Topical QID  . isosorbide mononitrate  60 mg Oral Daily  . liver oil-zinc oxide   Topical QID  . melatonin  6 mg Oral QHS  . pravastatin  40 mg Oral QHS  . sevelamer carbonate  1,600 mg Oral TID WC  . sodium chloride flush  3 mL Intravenous Once     Dialysis Orders: MTTS at [facility] 4hr, 350/600, EDW 51kg, 2K/2Ca, AVG, -heparin 1400units IVbolus - Hectoral 47mcg IV q HD - NoESA, Hgb >11  Assessment/Plan: 1. Acute sigmoid diverticulitis: s/p Ceftriaxone + Flagyl.  2. Back pain: New T12 compression Fx on imaging. Pain control per primary. On nasal calcitonin - watch Ca levels.IR consulted for T12 kyphoplasty which was done 09/06/2019. 3. ESRD/hypokalemia:MTTS schedule recently. K+ 2.9 4/22. used 4.0 K bath. Given 40 K - up to K 4.5 today - HD orders for Saturday if not d/c today.- hold heparin- start on 2 K bath Saturday 4. Hypertension/volume:BPStable net UF 1.4 4/22 with post wt 52.8 above edw 5. Anemia:Hgb  9.7->10.7  Aranesp 25 mcg IV with HD 4/22  6. Metabolic bone disease:Ca ok, Phos at goal. 1.9 P 4/22 was drawn post HD. Continue homebindersand VDRA. 7. Nutrition:Alb low -  addedpro-stat to regular diet - also added fluid restriction 8. COPD 9. Confusion/delirium: ?related to pain medications. Better today. 10.GOC: Appreciate palliative care consult. Now DNR.- need to convey this to HD unit at discharge.  Overall decline in mental and functional status at home. Ongoing discussions about dialysis.Discussed with palliative care yesterday-pt does not wish to stop HD at present. Plan SNF- [facility] when approved by insurance  Myriam Jacobson, [facility] 09/08/2019, 11:49 AM  [employer] [phone]

## 2019-09-08 NOTE — Discharge Summary (Addendum)
Physician Discharge Summary  Valerie Terrell HKV:[id number] DOB: 14-Mar-1933 DOA: 08/31/2019  PCP: Tammi Sou, MD  Admit date: 08/31/2019 Discharge date: 09/08/2019  Admitted From: Home  Discharge disposition: SNF   Recommendations for Outpatient Follow-Up:   . Follow up with your primary care provider at the skilled nursing facility in 3 to 5 days. . Check CBC, BMP in the next visit. [name] on Monday Tuesday Thursday Saturday as scheduled.  Continue oxygen by nasal cannula at 2 L/min.  Discharge Diagnosis:   Principal Problem:   Diverticulitis Active Problems:   PAF (paroxysmal atrial fibrillation) (HCC)   Essential hypertension   History of CVA (cerebrovascular accident)   Cardiac pacemaker   Non-ischemic cardiomyopathy ([place])   ESRD on dialysis ([place])   ESRD (end stage renal disease) ([place])   Compression fracture of T11 vertebra ([place])   Acute diverticulitis   Palliative care by specialist   Goals of care, counseling/discussion   DNR (do not resuscitate)   Inadequate pain control   Discharge Condition: Improved.  Diet recommendation: Low sodium, heart healthy.  Fluid restriction 1200 mL daily  Wound care: None.  Code status: DNR   History of Present Illness:   Valerie Terrell is a 86 y.o.femalewithhistory of ESRD on hemodialysis on Monday, Tuesday, Thursday, Saturdays who was recently admitted for fluid overload and discharged on August 17, 2019 presented to the ER because of abdominal pain. Patient stated that she had been having abdominal pain generalized for the last 1 week which had been gradually worsening.  In the ER, CT scan of the abdomen pelvis shows diverticulitis of sigmoid colon and also a new T12 compression fracture. Patient was able to move extremities and denied any incontinence of urine or bowel. Labs showed WBC count of 18, potassium 3.8 creatinine 4.2 LFTs normal except for albumin of 3.1. EKG showed normal sinus rhythm. Covid test  was negative. On exam abdomen was benign. Patient was able to move all extremities. Patient was started on ceftriaxone and Flagyl for diverticulitis and was admitted to the hospital.   Hospital Course:   Following conditions were addressed during hospitalization as listed below,  Acute sigmoid diverticulitis Resolved.  Patient received and completed course of Rocephin and Flagyl.  Tolerating diet.    CT abdomen/pelvis notable for slight differential wall thickening sigmoid colon with pericolonic fat stranding consistent with acute diverticulitis.  No fever or leukocytosis at this time.  New T11 compression fracture with intractable pain, Hx of chronic pain Status post IR guided kyphoplasty,  on 09/06/2019.   Improved pain.  On fentanyl and oxycodone.  We will continue for now.  Consider de-escalation of narcotics as outpatient.  CT abdomen/pelvis was notable for T11 compression fracture (reviewed with radiology and is T11) which appears new with less than 25% height loss.  She was seen by physical therapy who recommended skilled nursing facility placement on discharge.  ESRD on HD MTTS  Currently receiving  hemodialysis 4 days/week.  Dialyzes at [facility] dialysis center.  Missed dialysis on 08/31/2019.    Patient was continued on hemodialysis while in the hospital.   Will need to continue on discharge.  History of tachybrady syndrome s/p PPM, Paroxysmal atrial fibrillation Rate controlled.  Continue amiodarone.  Not on anticoagulation due to frequent falls.  Hyperlipidemia continue statin.  History of nonischemic cardiomyopathy/Essential hypertension Transthoracic echocardiogram on 11/2017 with EF 55-60% (improved from 35% prior), grade 2 diastolic dysfunction, mild/moderate MR, LA moderately dilated, mild TR. we will continue Imdur, fluid restriction.  Confusion with agitation Improved, possibility of underlying dementia hospital induced delirium and narcotics.  To cut down narcotics  as able  Sacral stage I pressure ulcer, present on admission --Continue local wound care, continue offloading frequently  Frail, deconditioning.  At this time patient is considered for skilled nursing facility placement.  Disposition.  At this time, patient is stable for disposition to skilled nursing facility.  Spoke with the patient's daughter at bedside.  Addendum:  09/09/2019 11:08 AM  Patient has remained stable.  No changes in medications and discharge instructions.  Stable for disposition to [facility].  Medical Consultants:    Interventional radiology  Nephrology  Procedures:     T11 kyphoplasty on 4/21  Hemodialysis Subjective:   Today, patient continues to feel better.  Had some diarrhea yesterday.  Denies any overt pain.  No nausea, vomiting.  No increasing shortness of breath, cough or chest pain.  Discharge Exam:   Vitals:   09/08/19 0416 09/08/19 0746  BP: (!) 137/52 (!) 154/59  Pulse: 73 76  Resp: 17 16  Temp: 97.7 F (36.5 C) 97.6 F (36.4 C)  SpO2: 93% 100%   Vitals:   09/07/19 1306 09/07/19 1949 09/08/19 0416 09/08/19 0746  BP: (!) 105/35 137/75 (!) 137/52 (!) 154/59  Pulse: 71 73 73 76  Resp: 17 17 17 16   Temp: 98.1 F (36.7 C) 98 F (36.7 C) 97.7 F (36.5 C) 97.6 F (36.4 C)  TempSrc: Oral Oral Oral Oral  SpO2: 96% 99% 93% 100%  Weight:      Height:        General: Alert awake, not in obvious distress, on nasal cannula oxygen, appears frail and deconditioned HENT: pupils equally reacting to light,  No scleral pallor or icterus noted. Oral mucosa is moist.  Chest: Diminished breath sounds bilaterally.  No obvious wheezes noted. CVS: S1 &S2 heard, systolic murmur noted, regular rate and rhythm. Abdomen: Soft, nontender, nondistended.  Bowel sounds are heard.   Extremities: No cyanosis, clubbing , features of chronic venous insufficiency.  Peripheral pulses are palpable.  Right upper extremity AV graft in place Psych: Alert, awake  and oriented, normal mood CNS:  No cranial nerve deficits.  Moves all extremities. Skin: Warm and dry.  No rashes noted.  The results of significant diagnostics from this hospitalization (including imaging, microbiology, ancillary and laboratory) are listed below for reference.     Diagnostic Studies:   CT ABDOMEN PELVIS WO CONTRAST  Result Date: 08/31/2019 CLINICAL DATA:  Acute on chronic back pain for several days, emesis, short of breath EXAM: CT ABDOMEN AND PELVIS WITHOUT CONTRAST TECHNIQUE: Multidetector CT imaging of the abdomen and pelvis was performed following the standard protocol without IV contrast. COMPARISON:  12/13/2018 FINDINGS: Lower chest: Hypoventilatory changes are seen at the lung bases. There is chronic scarring at the left lung base. Diffuse atherosclerosis of the aorta and coronary vessels again noted. Extensive calcification of the aortic and mitral valves. Pacemaker unchanged. Hepatobiliary: Gallbladder sludge is identified without cholecystitis. The liver is unremarkable. Pancreas: Unremarkable. No pancreatic ductal dilatation or surrounding inflammatory changes. Spleen: Normal in size without focal abnormality. Adrenals/Urinary Tract: Progressive bilateral renal cortical atrophy consistent with end-stage renal disease. Stable cysts right kidney. No urinary tract calculi or obstruction. Bladder is unremarkable. The adrenals are unremarkable. Stomach/Bowel: There is circumferential wall thickening of the mid sigmoid colon, reference image 64. Mild pericolonic fat stranding consistent with acute colitis or diverticulitis. No bowel obstruction or ileus. The appendix is surgically absent.  Vascular/Lymphatic: There is severe atherosclerosis throughout the aorta and its branches. No pathologic adenopathy. Reproductive: Uterus is markedly atrophic.  No adnexal masses. Other: No free fluid or free gas. Diffuse atrophy of the abdominal wall musculature unchanged. Musculoskeletal: Chronic  T10 compression deformity with previous vertebral augmentation. There is an age-indeterminate T12 compression fracture, new since prior CT. Less than 25% loss of height. No significant paraspinal hematoma. No additional fractures. Postsurgical changes right hip. Reconstructed images demonstrate no additional findings. IMPRESSION: 1. Circumferential wall thickening of the mid sigmoid colon with mild pericolonic fat stranding, consistent with acute uncomplicated diverticulitis or colitis. 2. New T12 compression deformity, with less than 25% loss of height. Based on appearance, this could reflect an acute or subacute fracture. 3. Sequela of end-stage renal disease. 4. Aortic Atherosclerosis (ICD10-I70.0). Severe coronary artery atherosclerosis. Electronically Signed   By: Randa Ngo M.D.   On: 08/31/2019 19:37   DG Chest Port 1 View  Result Date: 08/31/2019 CLINICAL DATA:  Shortness of breath EXAM: PORTABLE CHEST 1 VIEW COMPARISON:  August 16, 2019 and April 13, 2019 FINDINGS: There is suspected scarring in the left base. There may also be epicardial fat prominence in this area. The appearance is stable. There is no apparent edema or airspace opacity. Heart is upper normal in size with pulmonary vascularity normal. Pacemaker lead tips are attached to the right atrium and right ventricle. No adenopathy. There is aortic atherosclerosis. Bones are osteoporotic. Patient is undergone prior kyphoplasty procedure in the lower thoracic region IMPRESSION: Suspect a degree of scarring in the lateral left base. No edema or airspace opacity. Stable cardiac silhouette. Pacemaker leads attached to right atrium and right ventricle. Bones osteoporotic. Aortic Atherosclerosis (ICD10-I70.0). Electronically Signed   By: Lowella Grip III M.D.   On: 08/31/2019 15:29     Labs:   Basic Metabolic Panel: Recent Labs  Lab 09/02/19 0654 09/03/19 0628 09/03/19 1136 09/03/19 1136 09/04/19 0454 09/04/19 0454  09/06/19 0349 09/06/19 0349 09/07/19 0729 09/07/19 0729 09/07/19 1554 09/08/19 0502  NA 133*   < > 138   < > 136  --  138  --  136  --  137 136  K 4.5   < > 3.8   < > 3.7   < > 3.5   < > 2.9*   < > 4.6 4.5  CL 92*   < > 96*   < > 94*  --  98  --  99  --  99 102  CO2 24   < > 26   < > 25  --  30  --  24  --  28 24  GLUCOSE 90   < > 78   < > 73  --  75  --  97  --  99 82  BUN 34*   < > 8   < > 15  --  8  --  23  --  8 15  CREATININE 6.34*   < > 2.96*   < > 3.93*  --  2.30*  --  3.67*  --  1.83* 2.59*  CALCIUM 8.1*   < > 7.6*   < > 7.7*  --  7.9*  --  7.9*  --  7.9* 8.2*  MG 2.2  --   --   --  1.9  --  1.8  --   --   --   --  1.8  PHOS 7.7*  --  4.1  --   --   --  3.3  --  4.4  --  1.9*  --    < > = values in this interval not displayed.   GFR Estimated Creatinine Clearance: 8.8 mL/min (A) (by C-G formula based on SCr of 2.59 mg/dL (H)). Liver Function Tests: Recent Labs  Lab 09/02/19 0654 09/03/19 1136 09/06/19 0349 09/07/19 0729 09/07/19 1554  ALBUMIN 2.4* 2.4* 2.1* 1.9* 2.1*   No results for input(s): LIPASE, AMYLASE in the last 168 hours. No results for input(s): AMMONIA in the last 168 hours. Coagulation profile No results for input(s): INR, PROTIME in the last 168 hours.  CBC: Recent Labs  Lab 09/03/19 1136 09/04/19 0454 09/06/19 0349 09/07/19 0729 09/08/19 0502  WBC 11.0* 7.0 9.3 7.0 6.7  HGB 11.3* 10.0* 10.5* 9.7* 10.7*  HCT 35.1* 31.3* 33.4* 31.6* 35.0*  MCV 95.1 98.4 99.1 99.7 98.6  PLT 188 208 191 207 225   Cardiac Enzymes: No results for input(s): CKTOTAL, CKMB, CKMBINDEX, TROPONINI in the last 168 hours. BNP: Invalid input(s): POCBNP CBG: No results for input(s): GLUCAP in the last 168 hours. D-Dimer No results for input(s): DDIMER in the last 72 hours. Hgb A1c No results for input(s): HGBA1C in the last 72 hours. Lipid Profile No results for input(s): CHOL, HDL, LDLCALC, TRIG, CHOLHDL, LDLDIRECT in the last 72 hours. Thyroid function  studies No results for input(s): TSH, T4TOTAL, T3FREE, THYROIDAB in the last 72 hours.  Invalid input(s): FREET3 Anemia work up No results for input(s): VITAMINB12, FOLATE, FERRITIN, TIBC, IRON, RETICCTPCT in the last 72 hours. Microbiology Recent Results (from the past 240 hour(s))  SARS CORONAVIRUS 2 (TAT 6-24 HRS) Nasopharyngeal Nasopharyngeal Swab     Status: None   Collection Time: 08/31/19  8:32 PM   Specimen: Nasopharyngeal Swab  Result Value Ref Range Status   SARS Coronavirus 2 NEGATIVE NEGATIVE Final    Comment: (NOTE) SARS-CoV-2 target nucleic acids are NOT DETECTED. The SARS-CoV-2 RNA is generally detectable in upper and lower respiratory specimens during the acute phase of infection. Negative results do not preclude SARS-CoV-2 infection, do not rule out co-infections with other pathogens, and should not be used as the sole basis for treatment or other patient management decisions. Negative results must be combined with clinical observations, patient history, and epidemiological information. The expected result is Negative. Fact Sheet for Patients: SugarRoll.be Fact Sheet for Healthcare Providers: https://www.woods-mathews.com/ This test is not yet approved or cleared by the [place] FDA and  has been authorized for detection and/or diagnosis of SARS-CoV-2 by FDA under an Emergency Use Authorization (EUA). This EUA will remain  in effect (meaning this test can be used) for the duration of the COVID-19 declaration under Section 56 4(b)(1) of the Act, 21 U.S.C. section 360bbb-3(b)(1), unless the authorization is terminated or revoked sooner. Performed at [facility], [place] [address], [place], [place] [address]   SARS CORONAVIRUS 2 (TAT 6-24 HRS) Nasopharyngeal Nasopharyngeal Swab     Status: None   Collection Time: 09/07/19  7:03 PM   Specimen: Nasopharyngeal Swab  Result Value Ref Range Status   SARS Coronavirus 2  NEGATIVE NEGATIVE Final    Comment: (NOTE) SARS-CoV-2 target nucleic acids are NOT DETECTED. The SARS-CoV-2 RNA is generally detectable in upper and lower respiratory specimens during the acute phase of infection. Negative results do not preclude SARS-CoV-2 infection, do not rule out co-infections with other pathogens, and should not be used as the sole basis for treatment or other patient management decisions. Negative results must be combined  with clinical observations, patient history, and epidemiological information. The expected result is Negative. Fact Sheet for Patients: SugarRoll.be Fact Sheet for Healthcare Providers: https://www.woods-mathews.com/ This test is not yet approved or cleared by the [place] FDA and  has been authorized for detection and/or diagnosis of SARS-CoV-2 by FDA under an Emergency Use Authorization (EUA). This EUA will remain  in effect (meaning this test can be used) for the duration of the COVID-19 declaration under Section 56 4(b)(1) of the Act, 21 U.S.C. section 360bbb-3(b)(1), unless the authorization is terminated or revoked sooner. Performed at [facility], [place] [address] [place], [place] [address]      Discharge Instructions:   Discharge Instructions    Diet - low sodium heart healthy   Complete by: As directed    Fluid restriction 1229ml/day   Discharge instructions   Complete by: As directed    Follow up with your primary care provider at the skilled nursing facility in 3 to 5 days.  Continue to use oxygen as prescribed.  Continue hemodialysis as scheduled.   Increase activity slowly   Complete by: As directed    As per physical therapy     Allergies as of 09/08/2019      Reactions   Clonidine Derivatives Palpitations, Other (See Comments)   VERY SEDATED   Adhesive [tape] Other (See Comments)   Tears skin up   Codeine Nausea Only   Nickel Rash   Sulfa Antibiotics Other (See  Comments)   Reaction unknown Did not feel well when taken Reaction unknown      Medication List    TAKE these medications   acetaminophen 500 MG tablet Commonly known as: TYLENOL Take 500-1,000 mg 3 (three) times daily as needed by mouth for moderate pain.   ACT Dry Mouth Lozg Use as directed 1 lozenge in the mouth or throat daily as needed (for dry mouth).   amiodarone 200 MG tablet Commonly known as: PACERONE TAKE 1 TABLET BY MOUTH  DAILY   aspirin EC 325 MG tablet Take 325 mg by mouth every evening.   b complex-vitamin c-folic acid 0.8 MG Tabs tablet Take 1 tablet by mouth See admin instructions. Take one tablet by mouth on Tuesday, Thursday, Saturday - after dialysis   calcitonin (salmon) 200 UNIT/ACT nasal spray Commonly known as: MIACALCIN/FORTICAL Place 1 spray into alternate nostrils daily.   D-5000 125 MCG (5000 UT) Tabs Generic drug: Cholecalciferol Take 1 tablet by mouth daily.   Denta 5000 Plus 1.1 % Crea dental cream Generic drug: sodium fluoride Place 1 application onto teeth at bedtime.   docusate sodium 100 MG capsule Commonly known as: COLACE Take 100 mg by mouth daily as needed for mild constipation.   doxercalciferol 2.5 MCG capsule Commonly known as: HECTOROL Take 2.5 mcg by mouth 3 (three) times a week.   feeding supplement (PRO-STAT SUGAR FREE 64) Liqd Take 30 mLs by mouth 2 (two) times daily.   fentaNYL 50 MCG/HR Commonly known as: [place] 1 patch onto the skin every 3 (three) days for 2 days.   heparin 1000 unit/mL Soln injection 1,000 Units by Dialysis route one time in dialysis.   hydrocortisone cream 1 % Apply topically 4 (four) times daily.   isosorbide mononitrate 60 MG 24 hr tablet Commonly known as: IMDUR TAKE 1 TABLET BY MOUTH  DAILY ; TAKE 1 TABLET ON  SUN, MON, WED, FRI MORNINGS AND 1 TABLET ON TUES, THU,  SAT AFTER DIALYSIS What changed: See the new  instructions.   Lidocaine 4 % Ptch Apply 1 patch topically  daily as needed (Mild pain).   lidocaine-prilocaine cream Commonly known as: EMLA Apply 1 application topically See admin instructions. Apply small amount to access site (AVF) one hour before dialysis, cover with occlusive dressing (saran wrap)   melatonin 3 MG Tabs tablet Take 2 tablets (6 mg total) by mouth at bedtime as needed (sleep).   multivitamin with minerals tablet Take 1 tablet by mouth at bedtime.   naloxone 0.4 MG/ML injection Commonly known as: NARCAN Inject IM as needed for reversal of opioid pain medication   ondansetron 4 MG tablet Commonly known as: ZOFRAN Take 1 tablet (4 mg total) by mouth every 8 (eight) hours as needed for nausea or vomiting.   Oxycodone HCl 10 MG Tabs Take 2 tablets (20 mg total) by mouth every 6 (six) hours as needed (for pain).   polyethylene glycol 17 g packet Commonly known as: MIRALAX / GLYCOLAX Take 17 g by mouth daily as needed for mild constipation.   polyvinyl alcohol 1.4 % ophthalmic solution Commonly known as: LIQUIFILM TEARS Place 1-2 drops into both eyes daily as needed for dry eyes.   pravastatin 40 MG tablet Commonly known as: PRAVACHOL TAKE 1 TABLET BY MOUTH  EVERY EVENING What changed: when to take this   sevelamer carbonate 800 MG tablet Commonly known as: RENVELA Take 1,600 mg by mouth 3 (three) times daily with meals.   vitamin C 500 MG tablet Commonly known as: ASCORBIC ACID Take 500 mg by mouth daily.   Vitamin D (Ergocalciferol) 1.25 MG (50000 UNIT) Caps capsule Commonly known as: DRISDOL Take 1 capsule (50,000 Units total) by mouth every Thursday.       Contact information for follow-up providers    [facility] Follow [facility].   Specialty: [place] Why: homee health services arranged Contact information: [place] [address] [phone]            Contact information for after-discharge care    Destination    [facility] Preferred SNF .   Service:  Skilled Nursing Contact information: [place] [phone]                   Time coordinating discharge: 39 minutes  Signed:  Amelia Burgard  [facility] 09/08/2019, 12:58 PM

## 2019-09-08 NOTE — TOC Progression Note (Addendum)
Transition of Care [facility]) - Progression Note    Patient Details  Name: Yareli Carthen MRN: [id number] Date of Birth: 10-02-1932  Transition of Care [facility]) CM/SW Contact  Sharin Mons, RN Phone Number: 09/08/2019, 8:52 AM  Clinical Narrative:     Pt will transition to [facility]. Awaiting insurance authorization for SNF... hoping approval received today. TOC team will continue to monitor ...   09/09/2019 Insurance authorization received for SNF placement. Reference # [id number]. Authorization  # [id number] x 5 days, next review 09/12/2019, Cristobal Goldmann CM.  Expected Discharge Plan: Skilled Nursing Facility Barriers to Discharge: Insurance Authorization  Expected Discharge Plan and Services Expected Discharge Plan: [place]   Discharge Planning Services: CM Consult   Living arrangements for the past 2 months: Single Family Home                           HH Arranged: PT, OT, Nurse's Aide, Social Work [employer] Agency: [facility] [facility] [employer] Date [place]: 09/07/19 Time [place]: 1210 Representative spoke with at [place]: [place] ([place]) Interventions    Readmission Risk Interventions Readmission Risk Prevention Plan 10/06/2018  Transportation Screening Complete  Medication Review [employer]) Complete  PCP or Specialist appointment within 3-5 days of discharge Complete  HRI or [place] Complete  SW Recovery Care/Counseling Consult Complete  [place] Complete  Some recent data might be hidden

## 2019-09-08 NOTE — Progress Notes (Signed)
Notified Dr. Louanne Belton and Alric Seton PA-C about pt's graft site. Pt's graft site is red, pt's daughter aware.

## 2019-09-08 NOTE — Consult Note (Signed)
WOC Nurse Consult Note: Patient receiving care in [facility] 5N04.  Daughter, primary caregiver, in room at the time of my assessment. Reason for Consult: "Pt currently has stage 1, found a darker red spot than the surrounding area" Wound type: Patient and her daughter state the patient sits for long periods of time in the same position.  The buttocks and sacrum are impacted by chronic tissue injury and are darker in color than other buttock tissue.  There is a very small spot at the area of the coccyx that is approximately 0.3 cm x 0.3 cm and dark pink and resembles an abrasion.  The patient has frequent episodes of incontinence at home and here.  She had stool present at the time of my visit.  She wears adult incontinent briefs at home.  The daughter and talked extensively about the importance of incontinence care, applying Desitin, changing the pull ups when soiled, changing positions, etc.  The daughter verbalized her understanding. Pressure Injury POA: Yes/No/NA Measurement: Wound bed: Drainage (amount, consistency, odor)  Periwound: Dressing procedure/placement/frequency: Apply to sacral/buttocks area and after each incontinent episode.  Monitor the wound area(s) for worsening of condition such as: Signs/symptoms of infection,  Increase in size,  Development of or worsening of odor, Development of pain, or increased pain at the affected locations.  Notify the medical team if any of these develop.  Thank you for the consult.  Discussed plan of care with the patient and daughter.  [place] nurse will not follow at this time.  Please re-consult the [place] team if needed.  Val Riles, RN, MSN, CWOCN, CNS-BC, pager [phone]

## 2019-09-09 ENCOUNTER — Emergency Department (HOSPITAL_COMMUNITY): Payer: Medicare Other

## 2019-09-09 ENCOUNTER — Emergency Department (HOSPITAL_COMMUNITY)
Admission: EM | Admit: 2019-09-09 | Discharge: 2019-09-10 | Disposition: A | Discharge status: Home / Self Care | Payer: Medicare Other | Attending: Emergency Medicine | Admitting: Emergency Medicine

## 2019-09-09 DIAGNOSIS — I498 Other specified cardiac arrhythmias: Secondary | ICD-10-CM | POA: Diagnosis not present

## 2019-09-09 DIAGNOSIS — Z87891 Personal history of nicotine dependence: Secondary | ICD-10-CM | POA: Diagnosis not present

## 2019-09-09 DIAGNOSIS — Z992 Dependence on renal dialysis: Secondary | ICD-10-CM | POA: Insufficient documentation

## 2019-09-09 DIAGNOSIS — Z79899 Other long term (current) drug therapy: Secondary | ICD-10-CM | POA: Diagnosis not present

## 2019-09-09 DIAGNOSIS — R040 Epistaxis: Secondary | ICD-10-CM

## 2019-09-09 DIAGNOSIS — W06XXXA Fall from bed, initial encounter: Secondary | ICD-10-CM | POA: Insufficient documentation

## 2019-09-09 DIAGNOSIS — N186 End stage renal disease: Secondary | ICD-10-CM | POA: Diagnosis not present

## 2019-09-09 DIAGNOSIS — I5042 Chronic combined systolic (congestive) and diastolic (congestive) heart failure: Secondary | ICD-10-CM | POA: Diagnosis not present

## 2019-09-09 DIAGNOSIS — S3992XA Unspecified injury of lower back, initial encounter: Secondary | ICD-10-CM | POA: Diagnosis not present

## 2019-09-09 DIAGNOSIS — I132 Hypertensive heart and chronic kidney disease with heart failure and with stage 5 chronic kidney disease, or end stage renal disease: Secondary | ICD-10-CM | POA: Diagnosis not present

## 2019-09-09 DIAGNOSIS — M546 Pain in thoracic spine: Secondary | ICD-10-CM | POA: Diagnosis not present

## 2019-09-09 DIAGNOSIS — Z7982 Long term (current) use of aspirin: Secondary | ICD-10-CM | POA: Insufficient documentation

## 2019-09-09 DIAGNOSIS — Z8673 Personal history of transient ischemic attack (TIA), and cerebral infarction without residual deficits: Secondary | ICD-10-CM | POA: Diagnosis not present

## 2019-09-09 DIAGNOSIS — S199XXA Unspecified injury of neck, initial encounter: Secondary | ICD-10-CM | POA: Diagnosis not present

## 2019-09-09 DIAGNOSIS — S0990XA Unspecified injury of head, initial encounter: Secondary | ICD-10-CM | POA: Diagnosis not present

## 2019-09-09 DIAGNOSIS — S299XXA Unspecified injury of thorax, initial encounter: Secondary | ICD-10-CM | POA: Diagnosis not present

## 2019-09-09 DIAGNOSIS — S0993XA Unspecified injury of face, initial encounter: Secondary | ICD-10-CM | POA: Diagnosis not present

## 2019-09-09 DIAGNOSIS — W19XXXA Unspecified fall, initial encounter: Secondary | ICD-10-CM

## 2019-09-09 LAB — RENAL FUNCTION PANEL
Albumin: 2 g/dL — ABNORMAL LOW (ref 3.5–5.0)
Anion gap: 13 (ref 5–15)
BUN: 19 mg/dL (ref 8–23)
CO2: 24 mmol/L (ref 22–32)
Calcium: 8.7 mg/dL — ABNORMAL LOW (ref 8.9–10.3)
Chloride: 98 mmol/L (ref 98–111)
Creatinine, Ser: 3.78 mg/dL — ABNORMAL HIGH (ref 0.44–1.00)
GFR calc Af Amer: 12 mL/min — ABNORMAL LOW (ref >60)
GFR calc non Af Amer: 10 mL/min — ABNORMAL LOW (ref >60)
Glucose, Bld: 88 mg/dL (ref 70–99)
Phosphorus: 3.5 mg/dL (ref 2.5–4.6)
Potassium: 4 mmol/L (ref 3.5–5.1)
Sodium: 135 mmol/L (ref 135–145)

## 2019-09-09 LAB — CBC
HCT: 34.1 % — ABNORMAL LOW (ref 36.0–46.0)
Hemoglobin: 10.7 g/dL — ABNORMAL LOW (ref 12.0–15.0)
MCH: 31.1 pg (ref 26.0–34.0)
MCHC: 31.4 g/dL (ref 30.0–36.0)
MCV: 99.1 fL (ref 80.0–100.0)
Platelets: 259 10*3/uL (ref 150–400)
RBC: 3.44 MIL/uL — ABNORMAL LOW (ref 3.87–5.11)
RDW: 16.2 % — ABNORMAL HIGH (ref 11.5–15.5)
WBC: 8.2 10*3/uL (ref 4.0–10.5)
nRBC: 0 % (ref 0.0–0.2)

## 2019-09-09 MED ORDER — SODIUM CHLORIDE 0.9 % IV SOLN: 100.0000 mL | INTRAVENOUS | Status: DC | PRN

## 2019-09-09 MED ORDER — LIDOCAINE-PRILOCAINE 2.5-2.5 % EX CREA: 1.0000 "application " | TOPICAL_CREAM | CUTANEOUS | Status: DC | PRN

## 2019-09-09 MED ORDER — DOXERCALCIFEROL 4 MCG/2ML IV SOLN
INTRAVENOUS | Status: AC
  Administered 2019-09-09: 3 ug via INTRAVENOUS
  Filled 2019-09-09: qty 2

## 2019-09-09 MED ORDER — PENTAFLUOROPROP-TETRAFLUOROETH EX AERO: 1.0000 "application " | INHALATION_SPRAY | CUTANEOUS | Status: DC | PRN

## 2019-09-09 MED ORDER — HEPARIN SODIUM (PORCINE) 1000 UNIT/ML DIALYSIS: 1000.0000 [IU] | INTRAMUSCULAR | Status: DC | PRN

## 2019-09-09 MED ORDER — LIDOCAINE HCL (PF) 1 % IJ SOLN: 5.0000 mL | INTRAMUSCULAR | Status: DC | PRN

## 2019-09-09 MED ORDER — ALTEPLASE 2 MG IJ SOLR: 2.0000 mg | Freq: Once | INTRAMUSCULAR | Status: DC | PRN

## 2019-09-09 NOTE — Progress Notes (Signed)
Pt's daughter refused printed rx, Dr. Louanne Belton notified. Pt's daughter stated pt has pain management through pt's PCP. Printed rx in shred.

## 2019-09-09 NOTE — Plan of Care (Signed)
  Problem: Nutrition: Goal: Adequate nutrition will be maintained Outcome: Progressing   Problem: Safety: Goal: Ability to remain free from injury will improve Outcome: Progressing   

## 2019-09-09 NOTE — Progress Notes (Signed)
Pt back from hemodialysis and on the unit. Pt not in distress and tolerated well.

## 2019-09-09 NOTE — Progress Notes (Signed)
Report is given to HD RN.

## 2019-09-09 NOTE — ED Triage Notes (Signed)
Pt from home for a fall from bed today. Dried blood noted to both nostrils. Denies new pain, c/o chronic pain. Per ems, pt recently discharged from hospital to a rehab facility but was taken home by family due to no visitors at facility. Pt A&Ox3. Family reported hx of discharge

## 2019-09-09 NOTE — Progress Notes (Signed)
Discharge paperwork and instructions given to pt and pt's daughter at bedside. Pt and pt's daughter have decided to d/c home instead of snf. Pt not in distress and tolerated well.

## 2019-09-09 NOTE — Care Management (Signed)
[facility] aware that patient is discharging home today.

## 2019-09-09 NOTE — Progress Notes (Addendum)
CSW spoke with [name] patients daughter and the patient and patient has decided to not go to [facility] for SNF. Patient wants to go home.  CSW spoke with Venia Carbon with hospice to let her know that patient is going home today and decided to not go to a skilled nursing facility. Anderson Malta said thank you and that she will follow up with patient on Monday.  CSW also spoke with [facility] to let them know that patient decided to go home.  TOC team will continue to follow.

## 2019-09-09 NOTE — Progress Notes (Addendum)
[facility] Progress Note   Subjective: Oriented to self and place. In recliner for HD but refusing to stand for weight (Doubt she can physically do this). Appears weak, back hurting again. Supposed to discharged post HD.   Objective Vitals:   09/09/19 0800 09/09/19 0803 09/09/19 0805 09/09/19 0830  BP: (!) 161/68 (!) 146/45 (!) 148/48 (!) 119/56  Pulse: 77 69 73 67  Resp: 15     Temp: 97.8 F (36.6 C)     TempSrc: Oral     SpO2: 98%     Weight:      Height:       Physical Exam General:Frail woman in NAD WCHEN:I7,P8 3/6 systolic M Lungs:CTAB A/P Abdomen:Active BS Extremities:No LE edema Dialysis Access:R AVG bruised, scab beside outside edge on AVG (not on AVG) blood lines connected   Additional Objective Labs: Basic Metabolic Panel: Recent Labs  Lab 09/07/19 0729 09/07/19 0729 09/07/19 1554 09/08/19 0502 09/09/19 0832  NA 136   < > 137 136 135  K 2.9*   < > 4.6 4.5 4.0  CL 99   < > 99 102 98  CO2 24   < > 28 24 24   GLUCOSE 97   < > 99 82 88  BUN 23   < > 8 15 19   CREATININE 3.67*   < > 1.83* 2.59* 3.78*  CALCIUM 7.9*   < > 7.9* 8.2* 8.7*  PHOS 4.4  --  1.9*  --  3.5   < > = values in this interval not displayed.   Liver Function Tests: Recent Labs  Lab 09/07/19 0729 09/07/19 1554 09/09/19 0832  ALBUMIN 1.9* 2.1* 2.0*   No results for input(s): LIPASE, AMYLASE in the last 168 hours. CBC: Recent Labs  Lab 09/04/19 0454 09/04/19 0454 09/06/19 0349 09/06/19 0349 09/07/19 0729 09/08/19 0502 09/09/19 0832  WBC 7.0   < > 9.3   < > 7.0 6.7 8.2  HGB 10.0*   < > 10.5*   < > 9.7* 10.7* 10.7*  HCT 31.3*   < > 33.4*   < > 31.6* 35.0* 34.1*  MCV 98.4  --  99.1  --  99.7 98.6 99.1  PLT 208   < > 191   < > 207 225 259   < > = values in this interval not displayed.   Blood Culture    Component Value Date/Time   SDES BLOOD LEFT ANTECUBITAL 07/18/2018 2250   SPECREQUEST  07/18/2018 2250    BOTTLES DRAWN AEROBIC AND ANAEROBIC Blood  Culture adequate volume   CULT  07/18/2018 2250    NO GROWTH 5 DAYS Performed at [facility], [place] [address] [place], [place] [address]    REPTSTATUS 07/23/2018 FINAL 07/18/2018 2250    Cardiac Enzymes: No results for input(s): CKTOTAL, CKMB, CKMBINDEX, TROPONINI in the last 168 hours. CBG: No results for input(s): GLUCAP in the last 168 hours. Iron Studies: No results for input(s): IRON, TIBC, TRANSFERRIN, FERRITIN in the last 72 hours. @lablastinr3 @ Studies/Results: No results found. Medications: . sodium chloride    . sodium chloride     . amiodarone  200 mg Oral Daily  . aspirin EC  325 mg Oral QPM  . calcitonin (salmon)  1 spray Alternating Nares Daily  . Chlorhexidine Gluconate Cloth  6 each Topical Q0600  . darbepoetin (ARANESP) injection - DIALYSIS  25 mcg Intravenous Q Thu-HD  . doxercalciferol  3 mcg Intravenous Q T,Th,Sa-HD  . feeding supplement (PRO-STAT  SUGAR FREE 64)  30 mL Oral BID  . fentaNYL  1 patch Transdermal Q72H  . fentaNYL  1 patch Transdermal Q72H  . hydrocortisone cream   Topical QID  . isosorbide mononitrate  60 mg Oral Daily  . liver oil-zinc oxide   Topical QID  . melatonin  6 mg Oral QHS  . multivitamin  1 tablet Oral QHS  . pravastatin  40 mg Oral QHS  . sevelamer carbonate  1,600 mg Oral TID WC  . sodium chloride flush  3 mL Intravenous Once     Dialysis Orders: MTTS at [facility] 4hr, 350/600, EDW 51kg, 2K/2Ca, AVG, -heparin 1400units IVbolus - Hectoral 51mcg IV q HD - NoESA, Hgb >11  Assessment/Plan: 1. Acute sigmoid diverticulitis: s/p Ceftriaxone + Flagyl.  2. Back pain: New T12 compression Fx on imaging. Pain control per primary. On nasal calcitonin - watch Ca levels.IR consulted for T12 kyphoplastywhich was done 09/06/2019. 3. ESRD/hypokalemia:MTTS schedule recently. K+ 2.9 4/22. used 4.0 K bath. K+ 4.0 today. 2.0 K bath.  4. Hypertension/volume:BPStable. Attempting UFG 2.0 liters 5. Anemia:Hgb 9.7->10.7  Aranesp  25 mcg IV with HD 4/22  6. Metabolic bone disease:Ca ok, Phos at goal. PO4 at goal. Continue homebindersand VDRA. 7. Nutrition:Alb low -  addedpro-stat to regular diet - also added fluid restriction 8. COPD 9. Confusion/delirium: Mental status continues to wax and wane. Consider mild underlying dementia.  10.GOC: Appreciate palliative care consult. Now DNR.- need to convey this to HD unit at discharge.  Overall decline in mental and functional status at home. Ongoing discussions about dialysis.Discussed with palliative care yesterday-pt does not wish to stop HD at present. Plan SNF- [facility] when approved by [employer]. Valerie Gitto NP-C 09/09/2019, 9:18 AM  [employer] [phone]

## 2019-09-09 NOTE — Progress Notes (Signed)
[facility])  Ms. Valerie Terrell is our current patient in the home with palliative care services.  [facility] will continue to follow while hospitalized and continue palliative services once she returns home.  Venia Carbon RN, BSN, [facility] Liaison

## 2019-09-09 NOTE — Progress Notes (Signed)
Patient's condition has remained stable overnight.  Patient was seen and examined at the hemodialysis unit today.  No changes in the medication and discharge plan.  Patient is stable for disposition to skilled nursing facility today.  Refer to discharge summary and instructions on 09/08/2019.

## 2019-09-10 ENCOUNTER — Emergency Department (HOSPITAL_COMMUNITY): Payer: Medicare Other

## 2019-09-10 DIAGNOSIS — S3992XA Unspecified injury of lower back, initial encounter: Secondary | ICD-10-CM | POA: Diagnosis not present

## 2019-09-10 DIAGNOSIS — S299XXA Unspecified injury of thorax, initial encounter: Secondary | ICD-10-CM | POA: Diagnosis not present

## 2019-09-10 DIAGNOSIS — M546 Pain in thoracic spine: Secondary | ICD-10-CM | POA: Diagnosis not present

## 2019-09-10 NOTE — ED Provider Notes (Signed)
[facility] EMERGENCY DEPARTMENT Provider Note   CSN: [id number] Arrival date & time: 09/09/19  2057     History Chief Complaint  Patient presents with  . Fall    Valerie Terrell is a 86 y.o. female.  86 year old female with extensive past medical history below including recent kyphoplasty for thoracic spine fracture who presents with fall.  Prior to arrival, the patient fell out of bed.  Daughter was in another room, heard her fall, when she arrived the patient was awake and alert.  She denied loss of consciousness.  She initially had a nosebleed but this has since stopped.  She was complaining of worsened back pain, it has improved since receiving oxycodone in the ED.  She also wears fentanyl patches for her pain.  Patient denies any headache, chest wall pain, hip pain, or extremity pain.  The history is provided by the patient and a relative.  Fall       Past Medical History:  Diagnosis Date  . Anemia of chronic disease 2018   + anemia of CRI?  [name] Atrophy of left kidney    with absent blood flow by renal artery dopplers (Dr. Gwenlyn Found)  . 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 Chronic combined systolic and diastolic CHF (congestive heart failure) ([place])    a. 11/2016: echo showing EF of 35-40%, RV strain noted, mild MR and mild TR.   Echo showed much improved EF 12/2017 (55-60%)  . Chronic respiratory failure with hypoxia ([place]) 02/11/2017   Now on chronic O2 (intermittent as of summer 2019).    . Closed fracture of right superior pubic ramus ([place]) 07/18/2018   Non op mgmt (Dr. Victorino December was ortho who saw her in hosp but i don't think pt followed up with him).  . Closed right hip fracture ([place]) 10/04/2018   ORIF  . Debilitated patient    WC dependent as of 2018.  Hosp bed + full assistance with most ADLs s/p hip fx surg 09/2018.  [name] ESRD on hemodialysis ([place]) 01/2017   T/Th/Sat schedule- [place].  Right basilic AV fistula 17/6160.  Graft  2019.  [name] History of adenomatous polyp of colon   . History of kidney stones   . History of subarachnoid hemorrhage 10/2014   after syncope and while on xarelto  . Hyperlipidemia   . Hypertension    Difficult to control, in the setting of one functioning kidney: pt was referred to nephrology by Dr. Gwenlyn Found 06/2015.  . Lumbar radiculopathy 2012  . Lumbar spinal stenosis 2019   facet injections only very short term relief.  L1 and L2 selective nerve root blocks helpful 04/2018.  . Lumbar spondylosis    MR 07/2016---no sign of spinal nerve compression or cord compression.  Pt set up with outpt ortho while admitted to hosp 07/2016.  . Malnourished ([place])   . Metatarsal fracture 06/10/2016   Nondisplaced, left 5th metatarsal--pt was referred to ortho  . Osteopenia 2014   T-score -2.1  . PAF (paroxysmal atrial fibrillation) (HCC)    Eliquis started after BRAO and CVA.  She was changed to warfarin 2018. All anticoag stopped due to recurrent falls.  . Presence of permanent cardiac pacemaker   . Right rib fracture 12/2016   s/p fall  . Sick sinus syndrome ([place]) 11/2016   Dual chamber pacer insertion 2018 (Dr. Lovena Le)  . Stroke ([place]) 73/7106   cardioembolic (had CVA while on no anticoag)--"scattered subacute punctate infarcts: 1 in  R parietal lobe and 2 in occipital cortex" on MRI br.  CT angio head/neck: aortic arch athero.  R ICA 20% stenosis, L ICA w/out any stenosis.  . Thoracic back pain 01/2017; 01/2018   2018: Facet?  Dr. Ramos->steroid injection.  01/2018-->CT T spine to check for a new comp fx-->none found.  Selective nerve root inj in T spine helpful on R side.   . Thoracic compression fracture ([place]) 07/2016   T3.  T7 and T8-- T8 kyphoplasty during hosp admission 07/2016.  Neuro referred pt to pain mgmt for consideration of injection 09/2016.  I referred her to endo 08/2016 for consideration of calcitonin treatment.    Patient Active Problem List   Diagnosis Date Noted  . Palliative care by  specialist   . Goals of care, counseling/discussion   . DNR (do not resuscitate)   . Inadequate pain control   . Compression fracture of T11 vertebra ([place]) 09/01/2019  . Acute diverticulitis 09/01/2019  . Diverticulitis 08/31/2019  . Pressure injury of skin 08/17/2019  . Volume overload 08/16/2019  . Disorder of phosphorus metabolism, unspecified 05/15/2019  . Hypoxemia 03/24/2019  . Anaphylactic shock, unspecified, sequela 02/23/2019  . Heterotopic ossification 12/29/2018  . Pain, unspecified 10/15/2018  . Hip fracture ([place]) 10/04/2018  . Fracture of pubic ramus ([place]) 07/19/2018  . Fracture of sacrum ([place]) 07/19/2018  . Perineal hematoma 07/19/2018  . Fever 07/19/2018  . Acute blood loss anemia 07/19/2018  . ESRD (end stage renal disease) ([place]) 05/20/2018  . Chronic back pain 05/14/2018  . Chronic pain syndrome 05/14/2018  . Iron deficiency anemia, unspecified 04/25/2018  . Hypoxia 12/14/2017  . Dyspnea 12/14/2017  . Secondary hyperparathyroidism of renal origin ([place]) 08/16/2017  . Degeneration of thoracic intervertebral disc 07/26/2017  . Moderate protein-calorie malnutrition ([place]) 02/23/2017  . Acute respiratory failure with hypoxia ([place]) 02/13/2017  . Hyperkalemia 02/13/2017  . ESRD on dialysis ([place]) 02/11/2017  . Chronic respiratory failure with hypoxia ([place]) 02/11/2017  . Debilitated patient 02/07/2017  . Poor tolerance for ambulation 02/07/2017  . Anemia in chronic kidney disease 02/04/2017  . Coagulation defect, unspecified ([place]) 02/04/2017  . Fracture of one rib, right side, initial encounter for closed fracture 01/11/2017  . Closed fracture of one rib of right side   . Chronic combined systolic and diastolic heart failure ([place]) 01/05/2017  . Palpitations 12/15/2016  . Hypokalemia 12/14/2016  . Leukocytosis 12/14/2016  . Long term (current) use of anticoagulants [Z79.01] 12/11/2016  . Non-ischemic cardiomyopathy ([place]) 12/07/2016  . Acute combined systolic and  diastolic heart failure ([place])  12/07/2016  . Cardiac pacemaker   . Acute on chronic respiratory failure with hypoxia ([place])   . Acute pulmonary edema with congestive heart failure ([place]) 11/30/2016  . Elevated troponin level 11/30/2016  . Anemia 11/30/2016  . Chronic midline thoracic back pain 09/15/2016  . History of CVA (cerebrovascular accident) 09/15/2016  . Hyperlipidemia   . Nausea & vomiting 08/05/2016  . Dehydration 08/05/2016  . Osteoporosis 07/28/2016  . Closed compression fracture of thoracic vertebra ([place]) 07/23/2016  . Thoracic compression fracture ([place]) 07/22/2016  . Flank pain 07/11/2016  . Hypoalbuminemia 07/11/2016  . Renal artery stenosis ([place]) 06/24/2016  . Nondisplaced fracture of fifth metatarsal bone, left foot, initial encounter for closed fracture 06/10/2016  . Rib contusion, left, initial encounter 06/10/2016  . Single kidney 03/13/2016  . Chest pain   . Dyslipidemia   . PAF (paroxysmal atrial fibrillation) ([place]) 01/28/2016  . Essential hypertension 01/28/2016  . History of  cardioembolic stroke 14/97/0263  . Personal history of subarachnoid hemorrhage 01/28/2016  . CKD (chronic kidney disease), stage IV ([place]) 01/28/2016  . Gait disturbance 01/27/2016  . Mitral valve disease 01/08/2014  . Colon polyps 07/10/2013  . Lumbar radiculopathy 02/04/2011    Past Surgical History:  Procedure Laterality Date  . APPENDECTOMY  child  . AV FISTULA PLACEMENT Right 03/31/2017   Procedure: Right ARM Basilic vein transposition;  Surgeon: Conrad [place] Point, MD;  Location[facility] OR;  Service: Vascular;  Laterality: Right;  . AV FISTULA PLACEMENT Right 12/06/2017   Procedure: INSERTION OF ARTERIOVENOUS (AV) GORE-TEX GRAFT ARM RIGHT UPPER EXTREMITY;  Surgeon: [name], MD;  Location: [place];  Service: Vascular;  Laterality: Right;  . CARDIOVASCULAR STRESS TEST  01/31/2016   Stress myoview: NORMAL/Low risk.  EF 56%.  . [place]  . COLONOSCOPY  2015   + hx of  adenomatous polyps.  Need digest health spec in [place] records to see when pt due for next colonoscopy  . DECLOT CKV Right 03/21/2019  . EYE SURGERY Bilateral    Catarct  . INSERTION OF DIALYSIS CATHETER Right 01/29/2017   Procedure: INSERTION OF DIALYSIS CATHETER- RIGHT INTERNAL JUGULAR;  Surgeon: [name], MD;  Location: [place];  Service: Vascular;  Laterality: Right;  . INTRAMEDULLARY (IM) NAIL INTERTROCHANTERIC Right 10/04/2018   Procedure: INTRAMEDULLARY (IM) NAIL INTERTROCHANTRIC;  Surgeon: Nicholes Stairs, MD;  Location: [place];  Service: Orthopedics;  Laterality: Right;  . IR GENERIC HISTORICAL  07/24/2016   IR KYPHO THORACIC WITH BONE BIOPSY 07/24/2016 Luanne Bras, MD [facility]-INTERV RAD  . IR KYPHO THORACIC WITH BONE BIOPSY  09/06/2019  . KYPHOPLASTY  07/27/2016   T8  . PACEMAKER IMPLANT N/A 12/03/2016   Procedure: Pacemaker Implant;  Surgeon: Evans Lance, MD;  Location: [place] CV LAB;  Service: Cardiovascular;  Laterality: N/A;  . Renal artery dopplers  02/26/2016   Her right renal dimension was 11 cm pole to pole with mild to moderate right renal artery stenosis. A right renal aortic ratio was 3.22 suggesting less than a 50% stenosis.  . Right upper arm AV Gore-Tex graft  12/06/2017   Dr. Donnetta Hutching  . TONSILLECTOMY    . TRANSTHORACIC ECHOCARDIOGRAM  01/29/2016; 11/2016; 12/2017   2017: EF 55-60%, normal LV wall motion, grade I DD.  No cardiac source of emboli was seen.  2018: EF 35-40%, could not assess DD due to a-fib, pulm HTN noted. 12/2017 EF 55-60%, nl wall motion, grd II DD, mod MR and pulm regurg, increased pulm pressure (peak 52).     OB History   No obstetric history on file.     Family History  Problem Relation Age of Onset  . Cancer Mother   . Heart disease Father   . Early death Father   . Sudden Cardiac Death Neg Hx     Social History   Tobacco Use  . Smoking status: Former Smoker    Years: 22.00    Quit date: 03/17/1972    Years since quitting:  47.5  . Smokeless tobacco: Never Used  Substance Use Topics  . Alcohol use: Yes    Alcohol/week: 1.0 standard drinks    Types: 1 Glasses of wine per week    Comment: wine  . Drug use: No    Home Medications Prior to Admission medications   Medication Sig Start Date End Date Taking? Authorizing Provider  acetaminophen (TYLENOL) 500 MG tablet Take 500-1,000 mg 3 (three)  times daily as needed by mouth for moderate pain.     [provider]  Amino Acids-Protein Hydrolys (FEEDING SUPPLEMENT, PRO-STAT SUGAR FREE 64,) LIQD Take 30 mLs by mouth 2 (two) times daily. 09/08/19   Pokhrel, Corrie Mckusick, MD  amiodarone (PACERONE) 200 MG tablet TAKE 1 TABLET BY MOUTH  DAILY Patient taking differently: Take 200 mg by mouth daily.  03/27/19   Lorretta Harp, MD  Artificial Saliva (ACT DRY MOUTH) LOZG Use as directed 1 lozenge in the mouth or throat daily as needed (for dry mouth).    [provider]  aspirin EC 325 MG tablet Take 325 mg by mouth every evening.    [provider]  b complex-vitamin c-folic acid (NEPHRO-VITE) 0.8 MG TABS tablet Take 1 tablet by mouth See admin instructions. Take one tablet by mouth on Tuesday, Thursday, Saturday - after dialysis 09/07/18   [provider]  calcitonin, salmon, (MIACALCIN/FORTICAL) 200 UNIT/ACT nasal spray Place 1 spray into alternate nostrils daily.  07/06/19   [provider]  D-5000 125 MCG (5000 UT) TABS Take 1 tablet by mouth daily. 08/18/19   [provider]  DENTA 5000 PLUS 1.1 % CREA dental cream Place 1 application onto teeth at bedtime.  04/06/19   [provider]  docusate sodium (COLACE) 100 MG capsule Take 100 mg by mouth daily as needed for mild constipation.     [provider]  doxercalciferol (HECTOROL) 2.5 MCG capsule Take 2.5 mcg by mouth 3 (three) times a week.  02/28/19 02/27/20  [provider]  fentaNYL (DURAGESIC) 50 MCG/HR Place 1 patch onto the skin every 3 (three)  days for 2 days. 09/08/19 09/10/19  Pokhrel, Corrie Mckusick, MD  heparin 1000 unit/mL SOLN injection 1,000 Units by Dialysis route one time in dialysis.  02/07/19 02/06/20  [provider]  hydrocortisone cream 1 % Apply topically 4 (four) times daily. 09/08/19   Pokhrel, Corrie Mckusick, MD  isosorbide mononitrate (IMDUR) 60 MG 24 hr tablet TAKE 1 TABLET BY MOUTH  DAILY ; TAKE 1 TABLET ON  SUN, MON, WED, FRI MORNINGS AND 1 TABLET ON TUES, THU,  SAT AFTER DIALYSIS Patient taking differently: Take 60 mg by mouth daily.  07/21/19   McGowen, Adrian [place], MD  Lidocaine 4 % PTCH Apply 1 patch topically daily as needed (Mild pain).    [provider]  lidocaine-prilocaine (EMLA) cream Apply 1 application topically See admin instructions. Apply small amount to access site (AVF) one hour before dialysis, cover with occlusive dressing (saran wrap) 11/11/18   [provider]  melatonin 3 MG TABS tablet Take 2 tablets (6 mg total) by mouth at bedtime as needed (sleep). 09/08/19   Pokhrel, Corrie Mckusick, MD  Multiple Vitamins-Minerals (MULTIVITAMIN WITH MINERALS) tablet Take 1 tablet by mouth at bedtime.     [provider]  naloxone [facility]) 0.4 MG/ML injection Inject IM as needed for reversal of opioid pain medication Patient not taking: Reported on 08/31/2019 07/20/19   Tammi Sou, MD  ondansetron (ZOFRAN) 4 MG tablet Take 1 tablet (4 mg total) by mouth every 8 (eight) hours as needed for nausea or vomiting. 07/12/19   McGowen, Adrian [place], MD  Oxycodone HCl 10 MG TABS Take 2 tablets (20 mg total) by mouth every 6 (six) hours as needed (for pain). 09/08/19   Pokhrel, Corrie Mckusick, MD  polyethylene glycol (MIRALAX / GLYCOLAX) 17 g packet Take 17 g by mouth daily as needed for mild constipation. 09/08/19   Pokhrel, Corrie Mckusick, MD  polyvinyl alcohol (LIQUIFILM TEARS) 1.4 % ophthalmic solution Place 1-2 drops into both eyes daily as needed for dry eyes.     [provider]  pravastatin (PRAVACHOL) 40 MG tablet TAKE 1  TABLET BY MOUTH  EVERY EVENING Patient taking differently: Take 40 mg by mouth at bedtime.  03/09/18   McGowen, Adrian [place], MD  sevelamer carbonate (RENVELA) 800 MG tablet Take 1,600 mg by mouth 3 (three) times daily with meals.     [provider]  vitamin C (ASCORBIC ACID) 500 MG tablet Take 500 mg by mouth daily.    [provider]  Vitamin D, Ergocalciferol, (DRISDOL) 1.25 MG (50000 UT) CAPS capsule Take 1 capsule (50,000 Units total) by mouth every Thursday. 04/28/18   McGowen, Adrian Black[place], MD    Allergies    Clonidine derivatives, Adhesive [tape], Codeine, Nickel, and Sulfa antibiotics  Review of Systems   Review of Systems All other systems reviewed and are negative except that which was mentioned in HPI  Physical Exam Updated Vital Signs BP (!) 114/53 (BP Location: Right Arm)   Pulse 88   Temp 98 F (36.7 C) (Oral)   Resp 16   Ht 4\' 11"  (1.499 m)   Wt 49.9 kg   LMP  (LMP Unknown)   SpO2 95%   BMI 22.22 kg/m   Physical Exam Vitals and nursing note reviewed.  Constitutional:      General: She is not in acute distress.    Appearance: She is well-developed.     Comments: Frail, chronically ill appearing elderly woman  HENT:     Head: Normocephalic.     Comments: Bruising on face; dried blood b/l nares Eyes:     Conjunctiva/sclera: Conjunctivae normal.     Pupils: Pupils are equal, round, and reactive to light.  Cardiovascular:     Rate and Rhythm: Normal rate and regular rhythm.     Heart sounds: Normal heart sounds. No murmur.  Pulmonary:     Effort: Pulmonary effort is normal.     Breath sounds: Normal breath sounds.  Chest:     Chest wall: No tenderness.  Abdominal:     General: Bowel sounds are normal. There is no distension.     Palpations: Abdomen is soft.     Tenderness: There is no abdominal tenderness.  Musculoskeletal:     Cervical back: Neck supple.     Comments: Full ROM all 4 extremities; no focal midline spinal tenderness  Skin:     General: Skin is warm and dry.     Findings: Bruising present.     Comments: Skin tear R shin; bandage on central thoracic spine; scattered old bruises on upper body and arms; fentanyl patches on L upper arm and upper back  Neurological:     Mental Status: She is alert and oriented to person, place, and time.     Comments: Fluent speech  Psychiatric:        Judgment: Judgment normal.     ED Results / Procedures / Treatments   Labs (all labs ordered are listed, but only abnormal results are displayed) Labs Reviewed - No data to display  EKG None  Radiology DG Thoracic Spine W/Swimmers  Result Date: 09/10/2019 CLINICAL DATA:  Pain after fall.  Recent kyphoplasty. EXAM: THORACIC SPINE - 3 VIEWS COMPARISON:  None. FINDINGS: Patient is status post kyphoplasty at T12 and T9. Anterior wedging of L1, mild, is unchanged since the August 31, 2019 CT scan. Mild anterior wedging of  T10 and T11 is also unchanged since the comparison CT scan. Mild anterior wedging of T8 is stable since February 2021. No other fractures are identified. Evaluation of the upper thoracic spine is somewhat limited. IMPRESSION: The patient is status post kyphoplasties at T9 and T12. Anterior wedging at several levels is stable since comparison studies as above. No acute fractures or malalignment identified. Dense calcified atherosclerosis in the abdominal aorta. Electronically Signed   By: Dorise Bullion III M.D   On: 09/10/2019 09:25   DG Lumbar Spine Complete  Result Date: 09/10/2019 CLINICAL DATA:  Recent fall, post kyphoplasty. EXAM: LUMBAR SPINE - COMPLETE 4+ VIEW COMPARISON:  07/04/2019 FINDINGS: Interval T11 kyphoplasty. Osteopenia. Spinal degenerative changes. No signs of acute fracture. No subluxation. Extensive vascular calcifications project anterior to the abdominal aorta as before. IMPRESSION: Interval T11 kyphoplasty. No signs of acute fracture. Osteopenia and degenerative changes. Electronically Signed   By:  Zetta Bills M.D.   On: 09/10/2019 09:26   CT Head Wo Contrast  Result Date: 09/09/2019 CLINICAL DATA:  [place] from bed EXAM: CT HEAD WITHOUT CONTRAST TECHNIQUE: Contiguous axial images were obtained from the base of the skull through the vertex without intravenous contrast. COMPARISON:  07/28/2017 FINDINGS: Brain: Chronic hypodensities are seen throughout the periventricular white matter and bilateral basal ganglia, consistent with chronic small vessel ischemic changes. Stable diffuse cerebral atrophy. No signs of acute infarct or hemorrhage. The lateral ventricles and remaining midline structures are unremarkable. No acute extra-axial fluid collections. No mass effect. Vascular: No hyperdense vessel or unexpected calcification. Skull: Normal. Negative for fracture or focal lesion. Sinuses/Orbits: No acute finding. Other: None. IMPRESSION: 1. No acute intracranial process. 2. Chronic small vessel ischemic changes and cerebral atrophy. Electronically Signed   By: Randa Ngo M.D.   On: 09/09/2019 21:44   CT Cervical Spine Wo Contrast  Result Date: 09/09/2019 CLINICAL DATA:  [place] from bed, chronic pain EXAM: CT CERVICAL SPINE WITHOUT CONTRAST TECHNIQUE: Multidetector CT imaging of the cervical spine was performed without intravenous contrast. Multiplanar CT image reconstructions were also generated. COMPARISON:  10/04/2018 FINDINGS: Alignment: Alignment is grossly anatomic. Skull base and vertebrae: No acute displaced fracture. Soft tissues and spinal canal: No prevertebral fluid or swelling. No visible canal hematoma. Disc levels: Mild diffuse cervical spondylosis with disc space narrowing and mild circumferential osteophyte formation at all levels. Changes are most pronounced at C5-6 and C6-7. No significant bony encroachment upon the central canal. There is bony fusion of the left C3/C4 facet, with uncovertebral hypertrophy and facet hypertrophy resulting in severe left neural foraminal narrowing. Upper  chest: Airway is patent.  Lung apices are clear. Other: Reconstructed images demonstrate no additional findings. IMPRESSION: 1. No acute displaced fracture. 2. Multilevel cervical spondylosis. Electronically Signed   By: Randa Ngo M.D.   On: 09/09/2019 21:50   CT Maxillofacial Wo Contrast  Result Date: 09/09/2019 CLINICAL DATA:  [place] from bed EXAM: CT MAXILLOFACIAL WITHOUT CONTRAST TECHNIQUE: Multidetector CT imaging of the maxillofacial structures was performed. Multiplanar CT image reconstructions were also generated. COMPARISON:  None. FINDINGS: Osseous: No fracture or mandibular dislocation. No destructive process. Orbits: Negative. No traumatic or inflammatory finding. Sinuses: Clear. Soft tissues: There is mucosal thickening within the left nasal passage. Remaining soft tissues are unremarkable. Limited intracranial: No significant or unexpected finding. IMPRESSION: 1. Mucosal thickening left nasal passage. 2. No acute displaced fracture. Electronically Signed   By: Randa Ngo M.D.   On: 09/09/2019 21:46    Procedures Procedures (including critical care  time)  Medications Ordered in ED Medications - No data to display  ED Course  I have reviewed the triage vital signs and the nursing notes.  Pertinent labs & imaging results that were available during my care of the patient were reviewed by me and considered in my medical decision making (see chart for details).    MDM Rules/Calculators/A&P                      VSS, GCS 15. CT head, face, and c-spine negative acute. XR T and L spine negative acute. Discussed supportive measures for pain and for nosebleed. Final Clinical Impression(s) / ED Diagnoses Final diagnoses:  Fall, initial encounter  Epistaxis due to trauma    Rx / DC Orders ED Discharge Orders    None       Sharyon Peitz, Wenda Overland, MD 09/10/19 [phone]

## 2019-09-11 ENCOUNTER — Telehealth: Payer: Self-pay

## 2019-09-11 DIAGNOSIS — N186 End stage renal disease: Secondary | ICD-10-CM

## 2019-09-11 DIAGNOSIS — Z8781 Personal history of (healed) traumatic fracture: Secondary | ICD-10-CM

## 2019-09-11 DIAGNOSIS — R5381 Other malaise: Secondary | ICD-10-CM

## 2019-09-11 DIAGNOSIS — I872 Venous insufficiency (chronic) (peripheral): Secondary | ICD-10-CM

## 2019-09-11 DIAGNOSIS — G894 Chronic pain syndrome: Secondary | ICD-10-CM

## 2019-09-11 DIAGNOSIS — Z992 Dependence on renal dialysis: Secondary | ICD-10-CM

## 2019-09-11 NOTE — Telephone Encounter (Signed)
Noted: nurse phone contact with patient for TCM. Signed:  Crissie Sickles, MD           09/11/2019

## 2019-09-11 NOTE — Telephone Encounter (Signed)
Spoke with patients daughter, [name].   Transition Care Management Follow-up Telephone Call  Admission: 4/15-4/23 Diagnosis: Diverticulitis   How have you been since you were released from the hospital? Daughter reports patient is confused more often.    Do you understand why you were in the hospital? yes   Do you understand the discharge instructions? yes   Where were you discharged to? Home with daughter. Original plan at discharge was to discharge to SNF [facility]), however daughter decided since patient is confused, she would be better at home.    Items Reviewed:  Medications reviewed: no  Allergies reviewed: yes  Dietary changes reviewed: yes  Referrals reviewed: no   Functional Questionnaire:   Activities of Daily Living (ADLs):   She states they are independent in the following: None.  States they require assistance with the following: ambulation, bathing and hygiene, feeding, continence, grooming, toileting and dressing   Any transportation issues/concerns?: yes   Any patient concerns? yes, Daughter requesting hospital bed with over bed table. Also, skilled nursing from [facility] Per PCP, orders placed for both.    Confirmed importance and date/time of follow-up visits scheduled yes  Provider Appointment booked with PCP May 5th 2021, virtually.   Confirmed with patient if condition begins to worsen call PCP or go to the ER.  Patient was given the office number and encouraged to call back with question or concerns.  : yes

## 2019-09-12 DIAGNOSIS — Z992 Dependence on renal dialysis: Secondary | ICD-10-CM | POA: Diagnosis not present

## 2019-09-12 DIAGNOSIS — N2581 Secondary hyperparathyroidism of renal origin: Secondary | ICD-10-CM | POA: Diagnosis not present

## 2019-09-12 DIAGNOSIS — E876 Hypokalemia: Secondary | ICD-10-CM | POA: Diagnosis not present

## 2019-09-12 DIAGNOSIS — E8770 Fluid overload, unspecified: Secondary | ICD-10-CM | POA: Diagnosis not present

## 2019-09-12 DIAGNOSIS — N186 End stage renal disease: Secondary | ICD-10-CM | POA: Diagnosis not present

## 2019-09-12 DIAGNOSIS — D689 Coagulation defect, unspecified: Secondary | ICD-10-CM | POA: Diagnosis not present

## 2019-09-13 ENCOUNTER — Telehealth: Payer: Self-pay

## 2019-09-13 ENCOUNTER — Telehealth: Payer: Self-pay | Admitting: Internal Medicine

## 2019-09-13 DIAGNOSIS — Z515 Encounter for palliative care: Secondary | ICD-10-CM

## 2019-09-13 DIAGNOSIS — N186 End stage renal disease: Secondary | ICD-10-CM

## 2019-09-13 NOTE — Telephone Encounter (Signed)
Pt/daughter needs to call 911 or proceed to the nearest emergency room.

## 2019-09-13 NOTE — Telephone Encounter (Signed)
Received call from PT, Barnetta Chapel w/ [facility]. She is currently at pt's home for resumption of PT/OT. Vitals were not at pt's normal. BP 115/50, O2 room air 75-85, last registration O2 was at 94 and respirations at 16 with hoarness and labored breathing and her normal is 18. Seen in ED for 4/15, diverticulitis and 4/24 for fall, kyphoplasty done at that time as well. Unsure if patient is refusing to go to ED. Pt's daughter. [name] is very concerned.  Please advise, thanks.

## 2019-09-13 NOTE — Telephone Encounter (Signed)
Sent as FYI.  EMS was called and patient taken to ED due to continued BP/O2 drop.

## 2019-09-13 NOTE — Telephone Encounter (Signed)
Received a call from [facility] who states that pt is at home with physical therapy who reports hypoxia in the 70's and BP in the low 25'O systolic.  She is asking for recommendations for patient. I told her that if patient has decided to discontinue hemodialysis, she can transition to Hospice.  If patient and daughter desire to continue aggressive therapies, EMS should be called for evaluation. Updated call from [facility] who stated that pt does not want to D/C dialysis and EMS has been called.  We will f/u with patient.  [name], NP-C

## 2019-09-14 ENCOUNTER — Emergency Department (HOSPITAL_COMMUNITY): Payer: Medicare Other

## 2019-09-14 ENCOUNTER — Encounter (HOSPITAL_COMMUNITY): Payer: Self-pay | Admitting: Emergency Medicine

## 2019-09-14 ENCOUNTER — Other Ambulatory Visit: Payer: Self-pay

## 2019-09-14 ENCOUNTER — Inpatient Hospital Stay (HOSPITAL_COMMUNITY)
Admission: EM | Admit: 2019-09-14 | Discharge: 2019-09-20 | DRG: 193 | Disposition: A | Discharge status: Skilled Nursing Facility | Payer: Medicare Other | Source: Ambulatory Visit | Attending: Internal Medicine | Admitting: Internal Medicine

## 2019-09-14 ENCOUNTER — Telehealth: Payer: Self-pay | Admitting: Internal Medicine

## 2019-09-14 DIAGNOSIS — R2689 Other abnormalities of gait and mobility: Secondary | ICD-10-CM | POA: Diagnosis not present

## 2019-09-14 DIAGNOSIS — I132 Hypertensive heart and chronic kidney disease with heart failure and with stage 5 chronic kidney disease, or end stage renal disease: Secondary | ICD-10-CM | POA: Diagnosis present

## 2019-09-14 DIAGNOSIS — Z888 Allergy status to other drugs, medicaments and biological substances status: Secondary | ICD-10-CM

## 2019-09-14 DIAGNOSIS — E8770 Fluid overload, unspecified: Secondary | ICD-10-CM | POA: Diagnosis not present

## 2019-09-14 DIAGNOSIS — Z9181 History of falling: Secondary | ICD-10-CM | POA: Diagnosis not present

## 2019-09-14 DIAGNOSIS — R609 Edema, unspecified: Secondary | ICD-10-CM | POA: Diagnosis not present

## 2019-09-14 DIAGNOSIS — R410 Disorientation, unspecified: Secondary | ICD-10-CM

## 2019-09-14 DIAGNOSIS — J189 Pneumonia, unspecified organism: Secondary | ICD-10-CM | POA: Diagnosis not present

## 2019-09-14 DIAGNOSIS — R1312 Dysphagia, oropharyngeal phase: Secondary | ICD-10-CM | POA: Diagnosis not present

## 2019-09-14 DIAGNOSIS — Z7401 Bed confinement status: Secondary | ICD-10-CM | POA: Diagnosis not present

## 2019-09-14 DIAGNOSIS — Z950 Presence of cardiac pacemaker: Secondary | ICD-10-CM | POA: Diagnosis present

## 2019-09-14 DIAGNOSIS — Z8673 Personal history of transient ischemic attack (TIA), and cerebral infarction without residual deficits: Secondary | ICD-10-CM

## 2019-09-14 DIAGNOSIS — S22080A Wedge compression fracture of T11-T12 vertebra, initial encounter for closed fracture: Secondary | ICD-10-CM | POA: Diagnosis present

## 2019-09-14 DIAGNOSIS — I480 Paroxysmal atrial fibrillation: Secondary | ICD-10-CM | POA: Diagnosis present

## 2019-09-14 DIAGNOSIS — J9621 Acute and chronic respiratory failure with hypoxia: Secondary | ICD-10-CM | POA: Diagnosis not present

## 2019-09-14 DIAGNOSIS — R627 Adult failure to thrive: Secondary | ICD-10-CM | POA: Diagnosis not present

## 2019-09-14 DIAGNOSIS — Z992 Dependence on renal dialysis: Secondary | ICD-10-CM

## 2019-09-14 DIAGNOSIS — M6281 Muscle weakness (generalized): Secondary | ICD-10-CM | POA: Diagnosis not present

## 2019-09-14 DIAGNOSIS — I120 Hypertensive chronic kidney disease with stage 5 chronic kidney disease or end stage renal disease: Secondary | ICD-10-CM | POA: Diagnosis not present

## 2019-09-14 DIAGNOSIS — E440 Moderate protein-calorie malnutrition: Secondary | ICD-10-CM | POA: Diagnosis not present

## 2019-09-14 DIAGNOSIS — Z515 Encounter for palliative care: Secondary | ICD-10-CM

## 2019-09-14 DIAGNOSIS — S299XXA Unspecified injury of thorax, initial encounter: Secondary | ICD-10-CM | POA: Diagnosis not present

## 2019-09-14 DIAGNOSIS — R000 Tachycardia, unspecified: Secondary | ICD-10-CM | POA: Diagnosis not present

## 2019-09-14 DIAGNOSIS — I495 Sick sinus syndrome: Secondary | ICD-10-CM | POA: Diagnosis present

## 2019-09-14 DIAGNOSIS — Z6822 Body mass index (BMI) 22.0-22.9, adult: Secondary | ICD-10-CM

## 2019-09-14 DIAGNOSIS — Z87891 Personal history of nicotine dependence: Secondary | ICD-10-CM

## 2019-09-14 DIAGNOSIS — E785 Hyperlipidemia, unspecified: Secondary | ICD-10-CM | POA: Diagnosis present

## 2019-09-14 DIAGNOSIS — G8929 Other chronic pain: Secondary | ICD-10-CM | POA: Diagnosis not present

## 2019-09-14 DIAGNOSIS — Z885 Allergy status to narcotic agent status: Secondary | ICD-10-CM

## 2019-09-14 DIAGNOSIS — M898X9 Other specified disorders of bone, unspecified site: Secondary | ICD-10-CM | POA: Diagnosis present

## 2019-09-14 DIAGNOSIS — Y95 Nosocomial condition: Secondary | ICD-10-CM | POA: Diagnosis not present

## 2019-09-14 DIAGNOSIS — G9340 Encephalopathy, unspecified: Secondary | ICD-10-CM

## 2019-09-14 DIAGNOSIS — R404 Transient alteration of awareness: Secondary | ICD-10-CM | POA: Diagnosis not present

## 2019-09-14 DIAGNOSIS — R498 Other voice and resonance disorders: Secondary | ICD-10-CM | POA: Diagnosis not present

## 2019-09-14 DIAGNOSIS — L89151 Pressure ulcer of sacral region, stage 1: Secondary | ICD-10-CM | POA: Diagnosis not present

## 2019-09-14 DIAGNOSIS — N186 End stage renal disease: Secondary | ICD-10-CM

## 2019-09-14 DIAGNOSIS — D631 Anemia in chronic kidney disease: Secondary | ICD-10-CM | POA: Diagnosis not present

## 2019-09-14 DIAGNOSIS — R54 Age-related physical debility: Secondary | ICD-10-CM | POA: Diagnosis not present

## 2019-09-14 DIAGNOSIS — N2581 Secondary hyperparathyroidism of renal origin: Secondary | ICD-10-CM | POA: Diagnosis not present

## 2019-09-14 DIAGNOSIS — Z87442 Personal history of urinary calculi: Secondary | ICD-10-CM

## 2019-09-14 DIAGNOSIS — Z7189 Other specified counseling: Secondary | ICD-10-CM | POA: Diagnosis not present

## 2019-09-14 DIAGNOSIS — R2681 Unsteadiness on feet: Secondary | ICD-10-CM | POA: Diagnosis not present

## 2019-09-14 DIAGNOSIS — I5042 Chronic combined systolic (congestive) and diastolic (congestive) heart failure: Secondary | ICD-10-CM | POA: Diagnosis present

## 2019-09-14 DIAGNOSIS — E876 Hypokalemia: Secondary | ICD-10-CM | POA: Diagnosis not present

## 2019-09-14 DIAGNOSIS — R296 Repeated falls: Secondary | ICD-10-CM | POA: Diagnosis not present

## 2019-09-14 DIAGNOSIS — R197 Diarrhea, unspecified: Secondary | ICD-10-CM | POA: Diagnosis present

## 2019-09-14 DIAGNOSIS — I10 Essential (primary) hypertension: Secondary | ICD-10-CM | POA: Diagnosis present

## 2019-09-14 DIAGNOSIS — I428 Other cardiomyopathies: Secondary | ICD-10-CM | POA: Diagnosis not present

## 2019-09-14 DIAGNOSIS — R269 Unspecified abnormalities of gait and mobility: Secondary | ICD-10-CM | POA: Diagnosis not present

## 2019-09-14 DIAGNOSIS — J9601 Acute respiratory failure with hypoxia: Secondary | ICD-10-CM | POA: Diagnosis present

## 2019-09-14 DIAGNOSIS — Z743 Need for continuous supervision: Secondary | ICD-10-CM | POA: Diagnosis not present

## 2019-09-14 DIAGNOSIS — Z66 Do not resuscitate: Secondary | ICD-10-CM | POA: Diagnosis not present

## 2019-09-14 DIAGNOSIS — Z882 Allergy status to sulfonamides status: Secondary | ICD-10-CM

## 2019-09-14 DIAGNOSIS — Z20822 Contact with and (suspected) exposure to covid-19: Secondary | ICD-10-CM | POA: Diagnosis present

## 2019-09-14 DIAGNOSIS — Z7982 Long term (current) use of aspirin: Secondary | ICD-10-CM

## 2019-09-14 DIAGNOSIS — M2550 Pain in unspecified joint: Secondary | ICD-10-CM | POA: Diagnosis not present

## 2019-09-14 DIAGNOSIS — Z8249 Family history of ischemic heart disease and other diseases of the circulatory system: Secondary | ICD-10-CM

## 2019-09-14 DIAGNOSIS — D689 Coagulation defect, unspecified: Secondary | ICD-10-CM | POA: Diagnosis not present

## 2019-09-14 DIAGNOSIS — Z79899 Other long term (current) drug therapy: Secondary | ICD-10-CM

## 2019-09-14 DIAGNOSIS — R0902 Hypoxemia: Secondary | ICD-10-CM | POA: Diagnosis not present

## 2019-09-14 DIAGNOSIS — K5900 Constipation, unspecified: Secondary | ICD-10-CM | POA: Diagnosis not present

## 2019-09-14 DIAGNOSIS — Z91048 Other nonmedicinal substance allergy status: Secondary | ICD-10-CM

## 2019-09-14 DIAGNOSIS — N189 Chronic kidney disease, unspecified: Secondary | ICD-10-CM

## 2019-09-14 LAB — COMPREHENSIVE METABOLIC PANEL
ALT: 6 U/L (ref 0–44)
AST: 21 U/L (ref 15–41)
Albumin: 2.3 g/dL — ABNORMAL LOW (ref 3.5–5.0)
Alkaline Phosphatase: 104 U/L (ref 38–126)
Anion gap: 15 (ref 5–15)
BUN: 11 mg/dL (ref 8–23)
CO2: 28 mmol/L (ref 22–32)
Calcium: 7.9 mg/dL — ABNORMAL LOW (ref 8.9–10.3)
Chloride: 97 mmol/L — ABNORMAL LOW (ref 98–111)
Creatinine, Ser: 3.11 mg/dL — ABNORMAL HIGH (ref 0.44–1.00)
GFR calc Af Amer: 15 mL/min — ABNORMAL LOW (ref >60)
GFR calc non Af Amer: 13 mL/min — ABNORMAL LOW (ref >60)
Glucose, Bld: 92 mg/dL (ref 70–99)
Potassium: 3.7 mmol/L (ref 3.5–5.1)
Sodium: 140 mmol/L (ref 135–145)
Total Bilirubin: 0.5 mg/dL (ref 0.3–1.2)
Total Protein: 6.1 g/dL — ABNORMAL LOW (ref 6.5–8.1)

## 2019-09-14 LAB — CBC
HCT: 37.5 % (ref 36.0–46.0)
Hemoglobin: 11.8 g/dL — ABNORMAL LOW (ref 12.0–15.0)
MCH: 31.1 pg (ref 26.0–34.0)
MCHC: 31.5 g/dL (ref 30.0–36.0)
MCV: 98.7 fL (ref 80.0–100.0)
Platelets: 259 10*3/uL (ref 150–400)
RBC: 3.8 MIL/uL — ABNORMAL LOW (ref 3.87–5.11)
RDW: 17.2 % — ABNORMAL HIGH (ref 11.5–15.5)
WBC: 15.4 10*3/uL — ABNORMAL HIGH (ref 4.0–10.5)
nRBC: 0 % (ref 0.0–0.2)

## 2019-09-14 LAB — CBG MONITORING, ED: Glucose-Capillary: 88 mg/dL (ref 70–99)

## 2019-09-14 LAB — RESPIRATORY PANEL BY RT PCR (FLU A&B, COVID)
Influenza A by PCR: NEGATIVE
Influenza B by PCR: NEGATIVE
SARS Coronavirus 2 by RT PCR: NEGATIVE

## 2019-09-14 LAB — LACTIC ACID, PLASMA: Lactic Acid, Venous: 1.1 mmol/L (ref 0.5–1.9)

## 2019-09-14 LAB — PROCALCITONIN: Procalcitonin: 0.41 ng/mL

## 2019-09-14 MED ORDER — SODIUM CHLORIDE 0.9 % IV SOLN
2.0000 g | Freq: Once | INTRAVENOUS | Status: AC
  Administered 2019-09-14: 21:00:00 2 g via INTRAVENOUS
  Filled 2019-09-14: qty 20

## 2019-09-14 MED ORDER — SODIUM CHLORIDE 0.9 % IV SOLN: 1.0000 g | Freq: Once | INTRAVENOUS | Status: DC

## 2019-09-14 MED ORDER — PIPERACILLIN-TAZOBACTAM 4.5 G IVPB: 4.5000 g | Freq: Four times a day (QID) | INTRAVENOUS | Status: DC

## 2019-09-14 MED ORDER — HEPARIN SODIUM (PORCINE) 5000 UNIT/ML IJ SOLN
5000.0000 [IU] | Freq: Three times a day (TID) | INTRAMUSCULAR | Status: DC
  Administered 2019-09-15 – 2019-09-20 (×13): 5000 [IU] via SUBCUTANEOUS
  Filled 2019-09-14 (×14): qty 1

## 2019-09-14 MED ORDER — PIPERACILLIN-TAZOBACTAM 3.375 G IVPB: 3.3750 g | Freq: Three times a day (TID) | INTRAVENOUS | Status: DC

## 2019-09-14 MED ORDER — VANCOMYCIN HCL IN DEXTROSE 1-5 GM/200ML-% IV SOLN
1000.0000 mg | Freq: Once | INTRAVENOUS | Status: AC
  Administered 2019-09-14: 23:00:00 1000 mg via INTRAVENOUS
  Filled 2019-09-14: qty 200

## 2019-09-14 MED ORDER — AMIODARONE HCL 200 MG PO TABS
200.0000 mg | ORAL_TABLET | Freq: Every day | ORAL | Status: DC
  Administered 2019-09-15 – 2019-09-20 (×6): 200 mg via ORAL
  Filled 2019-09-14 (×7): qty 1

## 2019-09-14 MED ORDER — PIPERACILLIN-TAZOBACTAM IN DEX 2-0.25 GM/50ML IV SOLN
2.2500 g | Freq: Three times a day (TID) | INTRAVENOUS | Status: DC
  Administered 2019-09-15 (×2): 2.25 g via INTRAVENOUS
  Filled 2019-09-14 (×3): qty 50

## 2019-09-14 MED ORDER — PRAVASTATIN SODIUM 40 MG PO TABS
40.0000 mg | ORAL_TABLET | Freq: Every day | ORAL | Status: DC
  Administered 2019-09-15 – 2019-09-19 (×5): 40 mg via ORAL
  Filled 2019-09-14 (×5): qty 1

## 2019-09-14 MED ORDER — ISOSORBIDE MONONITRATE ER 60 MG PO TB24
60.0000 mg | ORAL_TABLET | Freq: Every day | ORAL | Status: DC
  Administered 2019-09-16 – 2019-09-20 (×5): 60 mg via ORAL
  Filled 2019-09-14: qty 2
  Filled 2019-09-14 (×6): qty 1

## 2019-09-14 MED ORDER — SODIUM CHLORIDE 0.9 % IV SOLN
500.0000 mg | Freq: Once | INTRAVENOUS | Status: DC
  Filled 2019-09-14: qty 500

## 2019-09-14 MED ORDER — CALCIUM GLUCONATE-NACL 1-0.675 GM/50ML-% IV SOLN
1.0000 g | Freq: Once | INTRAVENOUS | Status: AC
  Administered 2019-09-15: 01:00:00 1000 mg via INTRAVENOUS
  Filled 2019-09-14: qty 50

## 2019-09-14 NOTE — Telephone Encounter (Signed)
12:25pm  Telephone follow-up with pt's daughter Valerie Terrell related to activities from 09/13/19.  She states that EMS did evaluate pt at her home on yesterday but pt refused transport to the hospital ER.  She also stated that her O2 sats were 90% and BP had improved to the low 295J systolic and pt does not recognized that she is her daughter.  Pt. Did go to dialysis appointment today.  We will keep her previously scheduled follow-up Palliative appointment for 4/30 at 1PM.  Daughter agrees with plan. I phoned nurse Melissa at [facility] to inform her of same and asked if they would contact the daughter to rescheduled home health care appointment.  She stated that she will be contacting Valerie Terrell.                                                                  [name], NP-C

## 2019-09-14 NOTE — H&P (Signed)
[facility] History and Physical  Valerie Terrell PJA:[id number] DOB: 05-14-1933 DOA: 09/14/2019  Referring EDP: Roxanne Mins PCP: Tammi Sou, MD   Chief Complaint: Weakness, AMS  HPI: Valerie Terrell is a 86 y.o. female with PMH PAF, hx CVA, ESRD, T12 compression, NICM with pacemaker, HTN, HLD and recent diverticulitis who presented to ER for altered mental status and weakness and found to have HCAP.  History mostly provided by patient's daughter at bedside. Recently discharged on 4/23 after hospitalization for diverticulitis. Patient noted to have weakness and confusion during admission but attributed to being in the hospital with infection and ultimately patient declined SNF placement. Upon going home, daughter reports that patient remained weak but over the past few days and especially today, the patient has been barely able to stand on her own. Normally she is very "sharp mentally" per her daughter. Sometimes she will have confusion post-dialysis but otherwise she is very clear. During the recent hospitalization and since, patient's daughter has noted worsening confusion. Today the patient was even confused about who her daughter was which has never happened. Valerie Terrell also reports diarrhea twice daily for past several days. This morning, PT came and patient reported to have O2 sats in 80's. Some coughing today but otherwise no SOB noted. They went to regularly scheduled dialysis and again noted to be weak with low sats and therefore dialysis session only partially completed and patient sent to ER. Daughter also reports poor eating over last several days to weeks. Per daughter, denies noting fever, vomiting, constipation, hematuria, hematochezia, melena, speech difficulty.  In the ED: Mild hypertension. Requiring 3L O2. Afebrile without tachycardia or tachypnea. Labs remarkable for: Cr 3.11 otherwise CMP WNL. WBC 15.4. COVID negative. CXR:  1. Cardiomegaly and possible  interstitial edema. 2. Increased LEFT lower lobe atelectasis or infiltrate. 3. Probable LEFT pleural effusion  CT Head: 1. Generalized cerebral atrophy. 2. No acute intracranial abnormality.  Called for admission for PNA. Patient also confused and weak and unable to continue current level of care at home with daughter. EDP ordered Azithromycin and Rocephin.  Review of Systems:  ROS limited due to patient factors: Acute encephalopathy. ROS provided as above by daughter to best of her knowledge.  Past Medical History:  Diagnosis Date  . Anemia of chronic disease 2018   + anemia of CRI?  [name] Atrophy of left kidney    with absent blood flow by renal artery dopplers (Dr. Gwenlyn Found)  . 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 Chronic combined systolic and diastolic CHF (congestive heart failure) ([place])    a. 11/2016: echo showing EF of 35-40%, RV strain noted, mild MR and mild TR.   Echo showed much improved EF 12/2017 (55-60%)  . Chronic respiratory failure with hypoxia ([place]) 02/11/2017   Now on chronic O2 (intermittent as of summer 2019).    . Closed fracture of right superior pubic ramus ([place]) 07/18/2018   Non op mgmt (Dr. Victorino December was ortho who saw her in hosp but i don't think pt followed up with him).  . Closed right hip fracture ([place]) 10/04/2018   ORIF  . Debilitated patient    WC dependent as of 2018.  Hosp bed + full assistance with most ADLs s/p hip fx surg 09/2018.  [name] ESRD on hemodialysis ([place]) 01/2017   T/Th/Sat schedule- [place].  Right basilic AV fistula 34/1937.  Graft 2019.  [name] History of adenomatous polyp of colon   . History of kidney stones   .  History of subarachnoid hemorrhage 10/2014   after syncope and while on xarelto  . Hyperlipidemia   . Hypertension    Difficult to control, in the setting of one functioning kidney: pt was referred to nephrology by Dr. Gwenlyn Found 06/2015.  . Lumbar radiculopathy 2012  . Lumbar spinal stenosis 2019   facet injections  only very short term relief.  L1 and L2 selective nerve root blocks helpful 04/2018.  . Lumbar spondylosis    MR 07/2016---no sign of spinal nerve compression or cord compression.  Pt set up with outpt ortho while admitted to hosp 07/2016.  . Malnourished ([place])   . Metatarsal fracture 06/10/2016   Nondisplaced, left 5th metatarsal--pt was referred to ortho  . Osteopenia 2014   T-score -2.1  . PAF (paroxysmal atrial fibrillation) (HCC)    Eliquis started after BRAO and CVA.  She was changed to warfarin 2018. All anticoag stopped due to recurrent falls.  . Presence of permanent cardiac pacemaker   . Right rib fracture 12/2016   s/p fall  . Sick sinus syndrome ([place]) 11/2016   Dual chamber pacer insertion 2018 (Dr. Lovena Le)  . Stroke ([place]) [id number]/7893   cardioembolic (had CVA while on no anticoag)--"scattered subacute punctate infarcts: 1 in R parietal lobe and 2 in occipital cortex" on MRI br.  CT angio head/neck: aortic arch athero.  R ICA 20% stenosis, L ICA w/out any stenosis.  . Thoracic back pain 01/2017; 01/2018   2018: Facet?  Dr. Ramos->steroid injection.  01/2018-->CT T spine to check for a new comp fx-->none found.  Selective nerve root inj in T spine helpful on R side.   . Thoracic compression fracture ([place]) 07/2016   T3.  T7 and T8-- T8 kyphoplasty during hosp admission 07/2016.  Neuro referred pt to pain mgmt for consideration of injection 09/2016.  I referred her to endo 08/2016 for consideration of calcitonin treatment.   Past Surgical History:  Procedure Laterality Date  . APPENDECTOMY  child  . AV FISTULA PLACEMENT Right 03/31/2017   Procedure: Right ARM Basilic vein transposition;  Surgeon: Conrad Stewart, MD;  Location: [facility] OR;  Service: Vascular;  Laterality: Right;  . AV FISTULA PLACEMENT Right 12/06/2017   Procedure: INSERTION OF ARTERIOVENOUS (AV) GORE-TEX GRAFT ARM RIGHT UPPER EXTREMITY;  Surgeon: [name], MD;  Location: [place];  Service: Vascular;  Laterality: Right;  .  CARDIOVASCULAR STRESS TEST  01/31/2016   Stress myoview: NORMAL/Low risk.  EF 56%.  . [place]  . COLONOSCOPY  2015   + hx of adenomatous polyps.  Need digest health spec in [place] records to see when pt due for next colonoscopy  . DECLOT CKV Right 03/21/2019  . EYE SURGERY Bilateral    Catarct  . INSERTION OF DIALYSIS CATHETER Right 01/29/2017   Procedure: INSERTION OF DIALYSIS CATHETER- RIGHT INTERNAL JUGULAR;  Surgeon: [name], MD;  Location: [place];  Service: Vascular;  Laterality: Right;  . INTRAMEDULLARY (IM) NAIL INTERTROCHANTERIC Right 10/04/2018   Procedure: INTRAMEDULLARY (IM) NAIL INTERTROCHANTRIC;  Surgeon: Nicholes Stairs, MD;  Location: [place];  Service: Orthopedics;  Laterality: Right;  . IR GENERIC HISTORICAL  07/24/2016   IR KYPHO THORACIC WITH BONE BIOPSY 07/24/2016 Luanne Bras, MD [facility]-INTER[facility] RAD  . IR KYPHO THORACIC WITH BONE BIOPSY  09/06/2019  . KYPHOPLASTY  07/27/2016   T8  . PACEMAKER IMPLANT N/A 12/03/2016   Procedure: Pacemaker Implant;  Surgeon: Evans Lance, MD;  Location: [place] [facility];  Service: Cardiovascular;  Laterality: N/A;  . Renal artery dopplers  02/26/2016   Her right renal dimension was 11 cm pole to pole with mild to moderate right renal artery stenosis. A right renal aortic ratio was 3.22 suggesting less than a 50% stenosis.  . Right upper arm AV Gore-Tex graft  12/06/2017   Dr. Donnetta Hutching  . TONSILLECTOMY    . TRANSTHORACIC ECHOCARDIOGRAM  01/29/2016; 11/2016; 12/2017   2017: EF 55-60%, normal LV wall motion, grade I DD.  No cardiac source of emboli was seen.  2018: EF 35-40%, could not assess DD due to a-fib, pulm HTN noted. 12/2017 EF 55-60%, nl wall motion, grd II DD, mod MR and pulm regurg, increased pulm pressure (peak 52).   Social History:  reports that she quit smoking about 47 years ago. She quit after 22.00 years of use. She has never used smokeless tobacco. She reports current alcohol use of about 1.0 standard  drinks of alcohol per week. She reports that she does not use drugs.  Allergies  Allergen Reactions  . Clonidine Derivatives Palpitations and Other (See Comments)    VERY SEDATED  . Adhesive [Tape] Other (See Comments)    Tears skin up  . Codeine Nausea Only  . Nickel Rash  . Sulfa Antibiotics Other (See Comments)    Reaction unknown Did not feel well when taken Reaction unknown    Family History  Problem Relation Age of Onset  . Cancer Mother   . Heart disease Father   . Early death Father   . Sudden Cardiac Death Neg Hx     Prior to Admission medications   Medication Sig Start Date End Date Taking? Authorizing Provider  acetaminophen (TYLENOL) 500 MG tablet Take 500-1,000 mg 3 (three) times daily as needed by mouth for moderate pain.    Yes [provider]  Amino Acids-Protein Hydrolys (FEEDING SUPPLEMENT, PRO-STAT SUGAR FREE 64,) LIQD Take 30 mLs by mouth 2 (two) times daily. 09/08/19  Yes Pokhrel, Laxman, MD  amiodarone (PACERONE) 200 MG tablet TAKE 1 TABLET BY MOUTH  DAILY Patient taking differently: Take 200 mg by mouth daily.  03/27/19  Yes Lorretta Harp, MD  Artificial Saliva (ACT DRY MOUTH) LOZG Use as directed 1 lozenge in the mouth or throat daily as needed (for dry mouth).   Yes [provider]  aspirin EC 325 MG tablet Take 325 mg by mouth every evening.   Yes [provider]  b complex-vitamin c-folic acid (NEPHRO-VITE) 0.8 MG TABS tablet Take 1 tablet by mouth See admin instructions. Take one tablet by mouth on Tuesday, Thursday, Saturday - after dialysis 09/07/18  Yes [provider]  calcitonin, salmon, (MIACALCIN/FORTICAL) 200 UNIT/ACT nasal spray Place 1 spray into alternate nostrils daily.  07/06/19  Yes [provider]  D-5000 125 MCG (5000 UT) TABS Take 1 tablet by mouth daily. 08/18/19  Yes [provider]  DENTA 5000 PLUS 1.1 % CREA dental cream Place 1 application onto teeth at bedtime.  04/06/19  Yes  [provider]  docusate sodium (COLACE) 100 MG capsule Take 100 mg by mouth daily as needed for mild constipation.    Yes [provider]  doxercalciferol (HECTOROL) 2.5 MCG capsule Take 2.5 mcg by mouth 3 (three) times a week.  02/28/19 02/27/20 Yes [provider]  heparin 1000 unit/mL SOLN injection 1,000 Units by Dialysis route one time in dialysis.  02/07/19 02/06/20 Yes [provider]  hydrocortisone cream 1 % Apply topically 4 (  four) times daily. 09/08/19  Yes Pokhrel, Corrie Mckusick, MD  isosorbide mononitrate (IMDUR) 60 MG 24 hr tablet TAKE 1 TABLET BY MOUTH  DAILY ; TAKE 1 TABLET ON  SUN, MON, WED, FRI MORNINGS AND 1 TABLET ON TUES, THU,  SAT AFTER DIALYSIS Patient taking differently: Take 60 mg by mouth daily.  07/21/19  Yes McGowen, Adrian Blackwater, MD  Lidocaine 4 % PTCH Apply 1 patch topically daily as needed (Mild pain).   Yes [provider]  lidocaine-prilocaine (EMLA) cream Apply 1 application topically See admin instructions. Apply small amount to access site (AVF) one hour before dialysis, cover with occlusive dressing (saran wrap) 11/11/18  Yes [provider]  Multiple Vitamins-Minerals (MULTIVITAMIN WITH MINERALS) tablet Take 1 tablet by mouth at bedtime.    Yes [provider]  ondansetron (ZOFRAN) 4 MG tablet Take 1 tablet (4 mg total) by mouth every 8 (eight) hours as needed for nausea or vomiting. 07/12/19  Yes McGowen, Adrian Blackwater, MD  Oxycodone HCl 10 MG TABS Take 2 tablets (20 mg total) by mouth every 6 (six) hours as needed (for pain). 09/08/19  Yes Pokhrel, Laxman, MD  polyethylene glycol (MIRALAX / GLYCOLAX) 17 g packet Take 17 g by mouth daily as needed for mild constipation. 09/08/19  Yes Pokhrel, Laxman, MD  polyvinyl alcohol (LIQUIFILM TEARS) 1.4 % ophthalmic solution Place 1-2 drops into both eyes daily as needed for dry eyes.    Yes [provider]  pravastatin (PRAVACHOL) 40 MG tablet TAKE 1 TABLET BY MOUTH  EVERY  EVENING Patient taking differently: Take 40 mg by mouth at bedtime.  03/09/18  Yes McGowen, Adrian Blackwater, MD  sevelamer carbonate (RENVELA) 800 MG tablet Take 1,600 mg by mouth 3 (three) times daily with meals.    Yes [provider]  vitamin C (ASCORBIC ACID) 500 MG tablet Take 500 mg by mouth daily.   Yes [provider]  Vitamin D, Ergocalciferol, (DRISDOL) 1.25 MG (50000 UT) CAPS capsule Take 1 capsule (50,000 Units total) by mouth every Thursday. 04/28/18  Yes McGowen, Adrian Blackwater, MD  melatonin 3 MG TABS tablet Take 2 tablets (6 mg total) by mouth at bedtime as needed (sleep). Patient not taking: Reported on 09/14/2019 09/08/19   Flora Lipps, MD  naloxone [facility]) 0.4 MG/ML injection Inject IM as needed for reversal of opioid pain medication Patient not taking: Reported on 08/31/2019 07/20/19   Tammi Sou, MD   Physical Exam: Vitals:   09/14/19 1845 09/14/19 1900 09/14/19 2118 09/14/19 2145  BP: (!) 146/53 140/61 (!) 169/62 (!) 131/49  Pulse: 83 84 80 80  Resp: 20 17 18    Temp:   98.8 F (37.1 C)   TempSrc:   Oral   SpO2: 98% 97% 98% 97%  Weight:      Height:        Wt Readings from Last 3 Encounters:  09/14/19 50 kg  09/09/19 49.9 kg  09/07/19 52.8 kg    . General: Appears frail. Alert. Oriented to person, place and month. Not oriented to year. . Eyes: EOMI, normal lids, irises & conjunctiva . ENT: grossly normal hearing, lips & tongue . Neck: normal ROM . Cardiovascular: Regular rate and rhythm. Systolic murmur noted. Mild LE edema, R>L . Respiratory: CTA bilaterally, no w/r/r. Normal respiratory effort. . Abdomen: soft, ntnd . Skin: no rash or induration seen on limited exam . Musculoskeletal: strength 2/5 in BUE/BLE  . Psychiatric: grossly normal mood and affect, speech fluent and appropriate .  Neurologic: grossly non-focal.          Labs on Admission:  Basic Metabolic Panel: Recent Labs  Lab 09/08/19 0502 09/09/19 0832 09/14/19 1624  NA  136 135 140  K 4.5 4.0 3.7  CL 102 98 97*  CO2 24 24 28   GLUCOSE 82 88 92  BUN 15 19 11   CREATININE 2.59* 3.78* 3.11*  CALCIUM 8.2* 8.7* 7.9*  MG 1.8  --   --   PHOS  --  3.5  --    Liver Function Tests: Recent Labs  Lab 09/09/19 0832 09/14/19 1624  AST  --  21  ALT  --  6  ALKPHOS  --  104  BILITOT  --  0.5  PROT  --  6.1*  ALBUMIN 2.0* 2.3*   No results for input(s): LIPASE, AMYLASE in the last 168 hours. No results for input(s): AMMONIA in the last 168 hours. CBC: Recent Labs  Lab 09/08/19 0502 09/09/19 0832 09/14/19 1624  WBC 6.7 8.2 15.4*  HGB 10.7* 10.7* 11.8*  HCT 35.0* 34.1* 37.5  MCV 98.6 99.1 98.7  PLT 225 259 259   Cardiac Enzymes: No results for input(s): CKTOTAL, CKMB, CKMBINDEX, TROPONINI in the last 168 hours.  BNP (last 3 results) Recent Labs    03/24/19 1040 08/11/19 0929 08/16/19 0758  BNP 2,088.4* 922.1* 1,660.5*    ProBNP (last 3 results) No results for input(s): PROBNP in the last 8760 hours.  CBG: Recent Labs  Lab 09/14/19 2121  GLUCAP 88    Radiological Exams on Admission: CT Head Wo Contrast  Result Date: 09/14/2019 CLINICAL DATA:  Altered mental status. EXAM: CT HEAD WITHOUT CONTRAST TECHNIQUE: Contiguous axial images were obtained from the base of the skull through the vertex without intravenous contrast. COMPARISON:  September 09, 2019 FINDINGS: Brain: There is mild cerebral atrophy with widening of the extra-axial spaces and ventricular dilatation. There are areas of decreased attenuation within the white matter tracts of the supratentorial brain, consistent with microvascular disease changes. Small chronic bilateral basal ganglia lacunar infarcts are seen. Vascular: No hyperdense vessel or unexpected calcification. Skull: Normal. Negative for fracture or focal lesion. Sinuses/Orbits: No acute finding. Other: None. IMPRESSION: 1. Generalized cerebral atrophy. 2. No acute intracranial abnormality. Electronically Signed   By:  Virgina Norfolk M.D.   On: 09/14/2019 19:23   DG Chest Port 1 View  Result Date: 09/14/2019 CLINICAL DATA:  Altered mental status. The fell and hit head on 04/24, followed by a normal head CT. EXAM: PORTABLE CHEST 1 VIEW COMPARISON:  08/31/2019 FINDINGS: Patient has LEFT-sided transvenous pacemaker with leads to the RIGHT atrium and RIGHT ventricle. The heart is enlarged. There is increased opacity at the LEFT lung base, consistent with atelectasis or infiltrate. Suspect LEFT pleural effusion. The increased prominence of interstitial markings raises a question of interstitial edema. IMPRESSION: 1. Cardiomegaly and possible interstitial edema. 2. Increased LEFT lower lobe atelectasis or infiltrate. 3. Probable LEFT pleural effusion. Electronically Signed   By: Nolon Nations M.D.   On: 09/14/2019 19:18    EKG: Independently reviewed. HR 90. Sinus rhythm. QTc 519. PVC's present. No STEMI.  Assessment/Plan Principal Problem:   HAP (hospital-acquired pneumonia) Active Problems:   PAF (paroxysmal atrial fibrillation) (HCC)   Essential hypertension   Hyperlipidemia   History of CVA (cerebrovascular accident)   Cardiac pacemaker   ESRD on dialysis [facility])   Acute respiratory failure with hypoxia (HCC)   Hypoxia   Moderate protein-calorie malnutrition (HCC)   Compression fracture  of T11 vertebra [facility])   Acute encephalopathy  86 y.o. female with PMH PAF, hx CVA, ESRD, T12 compression, NICM with pacemaker, HTN, HLD and recent diverticulitis who presented to ER for altered mental status and weakness and found to have HCAP.  Hospital Acquired PNA - recent admission hospitalization with IV abx use - CXR with Increased LEFT lower lobe atelectasis or infiltrate - Hx of cough and now requiring O2, elevated WBC - S/p Rocephin/Azithromycin in ED - Will start Vanc/Zosyn due to risk factors and recent admission; narrow as able  - Ur Legionella, Strep PNA - Procal and lactate ordered - blood cx  ordered  Confusion with agitation Acute Encephalopathy - presumed secondary to infection - CT Head negative - also poor PO intake, addressed as below - SLP  Frail, deconditioning - Poor PO intake - Albumin 2.3 - Nutrition consult  - PT/OT  ESRD on HD MTTS  - Received partial dialysis 4/29 - Need to consult Renal for continued inpatient dialysis  Hx of sigmoid diverticulitis - Resolved on last admission - Completed course of Rocephin and Flagyl - does present with hx of diarrhea although could be secondary to recent abx use  - abdomen soft and non-tender  Hx T11compression fracture with intractable pain, Hx of chronic pain - Status post IR guided kyphoplasty, on 09/06/2019 - Not complaining of pain currently - Holding narcotics in setting of AMS - PT/OT  History of tachybrady syndrome s/p PPM, Paroxysmal atrial fibrillation - Rate controlled - Continue amiodarone - Not on anticoagulation due to frequent falls.  Hyperlipidemia  - continue statin  History of nonischemic cardiomyopathy/Essential hypertension - Transthoracic echocardiogram on 11/2017 with EF 55-60% (improved from 35% prior), grade 2 diastolic dysfunction, mild/moderate MR, LA moderately dilated, mild TR - continue Imdur, fluid restriction.  Code Status: DNR. Declines chest compressions and intubation. Okay with NIV, pressors, ICU-level care.  DVT Prophylaxis: Heparin Family Communication: Daughter, Valerie Terrell, at bedside Disposition Plan: Admit to inpatient. Patient is at high risk for further decompensation due to age and co-morbidities. Requiring IV abx. Currently unable to continue current level of care with daughter at home.  Time spent: 70 minutes  Chauncey Mann, MD [facility] Pager [phone]

## 2019-09-14 NOTE — ED Triage Notes (Signed)
Pt arrives via EMS from HD with worsening AMS. Daughter reports that she was in hospital 4/15 for admission and diagnosed with delirium but has not gotten better since being home. Baseline is confusion. [place] and hit head on 4/24 with clear head CT. Pt has 1 hr 51 min of HD today. 20G left hand.

## 2019-09-14 NOTE — Progress Notes (Signed)
Pharmacy Antibiotic Note  Valerie Terrell is a 86 y.o. female admitted on 09/14/2019 with pneumonia.  Pharmacy has been consulted for vancomycin dosing. WBC 15.4. SCr 3.11. Of note patient has a h/o CKD on T/T/Sat HD. She received partial dialysis on 4/29   Plan: -Zosyn 2.25 gm IV Q 8 hours  -Vancomycin 1 gm IV once. Will order maintenance vancomycin dose based on HD schedule  -Monitor CBC, cultures and clinical progress   Height: 4\' 11"  (149.9 cm) Weight: 50 kg (110 lb 3.7 oz) IBW/kg (Calculated) : 43.2  Temp (24hrs), Avg:98.9 F (37.2 C), Min:98.8 F (37.1 C), Max:98.9 F (37.2 C)  Recent Labs  Lab 09/08/19 0502 09/09/19 0832 09/14/19 1624  WBC 6.7 8.2 15.4*  CREATININE 2.59* 3.78* 3.11*    Estimated Creatinine Clearance: 9.2 mL/min (A) (by C-G formula based on SCr of 3.11 mg/dL (H)).    Allergies  Allergen Reactions  . Clonidine Derivatives Palpitations and Other (See Comments)    VERY SEDATED  . Adhesive [Tape] Other (See Comments)    Tears skin up  . Codeine Nausea Only  . Nickel Rash  . Sulfa Antibiotics Other (See Comments)    Reaction unknown Did not feel well when taken Reaction unknown     Thank you for allowing pharmacy to be a part of this patient's care.  Albertina Parr, PharmD., BCPS, BCCCP Clinical Pharmacist Clinical phone for 09/14/19 until 11pm: [phone] If after 11pm, please refer to [facility] for unit-specific pharmacist

## 2019-09-14 NOTE — ED Provider Notes (Signed)
[facility] EMERGENCY DEPARTMENT Provider Note   CSN: [id number] Arrival date & time: 09/14/19  1611   History Chief complaint: Confusion, weakness  Valerie Terrell is a 86 y.o. female.  The history is provided by a relative. The history is limited by the condition of the patient (Altered mental status).  She has history of hyperlipidemia, chronic back pain, end-stage renal disease on hemodialysis, stroke, pacemaker placement and is brought in by her daughter because of progressive weakness and altered mentation.  She had recently been admitted to the hospital and was noted to have altered mentation which was felt to be possibly secondary to being away from home.  Skilled nursing care was recommended, but patient refused at that time.  Since then, confusion has not improved and has worsened to the point where the patient no longer recognizes her daughter.  Also, she has been developing progressive weakness to the point where she cannot stand.  Physical therapy at home noted oxygen saturation in the 80s, and she has no home oxygen.  She has been seen by palliative care, but has not been able to get on hospice because she wishes to continue dialysis.  There has been no known fever, chills, sweats.  There has been no cough.  There has been no vomiting or diarrhea.  She does not make urine.  Past Medical History:  Diagnosis Date  . Anemia of chronic disease 2018   + anemia of CRI?  [name] Atrophy of left kidney    with absent blood flow by renal artery dopplers (Dr. Gwenlyn Found)  . 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 Chronic combined systolic and diastolic CHF (congestive heart failure) ([place])    a. 11/2016: echo showing EF of 35-40%, RV strain noted, mild MR and mild TR.   Echo showed much improved EF 12/2017 (55-60%)  . Chronic respiratory failure with hypoxia ([place]) 02/11/2017   Now on chronic O2 (intermittent as of summer 2019).    . Closed fracture of right  superior pubic ramus ([place]) 07/18/2018   Non op mgmt (Dr. Victorino December was ortho who saw her in hosp but i don't think pt followed up with him).  . Closed right hip fracture ([place]) 10/04/2018   ORIF  . Debilitated patient    WC dependent as of 2018.  Hosp bed + full assistance with most ADLs s/p hip fx surg 09/2018.  [name] ESRD on hemodialysis ([place]) 01/2017   T/Th/Sat schedule- [place].  Right basilic AV fistula 94/1740.  Graft 2019.  [name] History of adenomatous polyp of colon   . History of kidney stones   . History of subarachnoid hemorrhage 10/2014   after syncope and while on xarelto  . Hyperlipidemia   . Hypertension    Difficult to control, in the setting of one functioning kidney: pt was referred to nephrology by Dr. Gwenlyn Found 06/2015.  . Lumbar radiculopathy 2012  . Lumbar spinal stenosis 2019   facet injections only very short term relief.  L1 and L2 selective nerve root blocks helpful 04/2018.  . Lumbar spondylosis    MR 07/2016---no sign of spinal nerve compression or cord compression.  Pt set up with outpt ortho while admitted to hosp 07/2016.  . Malnourished ([place])   . Metatarsal fracture 06/10/2016   Nondisplaced, left 5th metatarsal--pt was referred to ortho  . Osteopenia 2014   T-score -2.1  . PAF (paroxysmal atrial fibrillation) (HCC)    Eliquis started after BRAO and CVA.  She was changed to warfarin 2018. All anticoag stopped due to recurrent falls.  . Presence of permanent cardiac pacemaker   . Right rib fracture 12/2016   s/p fall  . Sick sinus syndrome ([place]) 11/2016   Dual chamber pacer insertion 2018 (Dr. Lovena Le)  . Stroke ([place]) 96/2952   cardioembolic (had CVA while on no anticoag)--"scattered subacute punctate infarcts: 1 in R parietal lobe and 2 in occipital cortex" on MRI br.  CT angio head/neck: aortic arch athero.  R ICA 20% stenosis, L ICA w/out any stenosis.  . Thoracic back pain 01/2017; 01/2018   2018: Facet?  Dr. Ramos->steroid injection.  01/2018-->CT T spine  to check for a new comp fx-->none found.  Selective nerve root inj in T spine helpful on R side.   . Thoracic compression fracture ([place]) 07/2016   T3.  T7 and T8-- T8 kyphoplasty during hosp admission 07/2016.  Neuro referred pt to pain mgmt for consideration of injection 09/2016.  I referred her to endo 08/2016 for consideration of calcitonin treatment.    Patient Active Problem List   Diagnosis Date Noted  . Palliative care by specialist   . Goals of care, counseling/discussion   . DNR (do not resuscitate)   . Inadequate pain control   . Compression fracture of T11 vertebra ([place]) 09/01/2019  . Acute diverticulitis 09/01/2019  . Diverticulitis 08/31/2019  . Pressure injury of skin 08/17/2019  . Volume overload 08/16/2019  . Disorder of phosphorus metabolism, unspecified 05/15/2019  . Hypoxemia 03/24/2019  . Anaphylactic shock, unspecified, sequela 02/23/2019  . Heterotopic ossification 12/29/2018  . Pain, unspecified 10/15/2018  . Hip fracture ([place]) 10/04/2018  . Fracture of pubic ramus ([place]) 07/19/2018  . Fracture of sacrum ([place]) 07/19/2018  . Perineal hematoma 07/19/2018  . Fever 07/19/2018  . Acute blood loss anemia 07/19/2018  . ESRD (end stage renal disease) ([place]) 05/20/2018  . Chronic back pain 05/14/2018  . Chronic pain syndrome 05/14/2018  . Iron deficiency anemia, unspecified 04/25/2018  . Hypoxia 12/14/2017  . Dyspnea 12/14/2017  . Secondary hyperparathyroidism of renal origin ([place]) 08/16/2017  . Degeneration of thoracic intervertebral disc 07/26/2017  . Moderate protein-calorie malnutrition ([place]) 02/23/2017  . Acute respiratory failure with hypoxia ([place]) 02/13/2017  . Hyperkalemia 02/13/2017  . ESRD on dialysis ([place]) 02/11/2017  . Chronic respiratory failure with hypoxia ([place]) 02/11/2017  . Debilitated patient 02/07/2017  . Poor tolerance for ambulation 02/07/2017  . Anemia in chronic kidney disease 02/04/2017  . Coagulation defect, unspecified ([place]) 02/04/2017  .  Fracture of one rib, right side, initial encounter for closed fracture 01/11/2017  . Closed fracture of one rib of right side   . Chronic combined systolic and diastolic heart failure ([place]) 01/05/2017  . Palpitations 12/15/2016  . Hypokalemia 12/14/2016  . Leukocytosis 12/14/2016  . Long term (current) use of anticoagulants [Z79.01] 12/11/2016  . Non-ischemic cardiomyopathy ([place]) 12/07/2016  . Acute combined systolic and diastolic heart failure ([place])  12/07/2016  . Cardiac pacemaker   . Acute on chronic respiratory failure with hypoxia ([place])   . Acute pulmonary edema with congestive heart failure ([place]) 11/30/2016  . Elevated troponin level 11/30/2016  . Anemia 11/30/2016  . Chronic midline thoracic back pain 09/15/2016  . History of CVA (cerebrovascular accident) 09/15/2016  . Hyperlipidemia   . Nausea & vomiting 08/05/2016  . Dehydration 08/05/2016  . Osteoporosis 07/28/2016  . Closed compression fracture of thoracic vertebra ([place]) 07/23/2016  . Thoracic compression fracture ([place]) 07/22/2016  . Flank pain 07/11/2016  .  Hypoalbuminemia 07/11/2016  . Renal artery stenosis ([place]) 06/24/2016  . Nondisplaced fracture of fifth metatarsal bone, left foot, initial encounter for closed fracture 06/10/2016  . Rib contusion, left, initial encounter 06/10/2016  . Single kidney 03/13/2016  . Chest pain   . Dyslipidemia   . PAF (paroxysmal atrial fibrillation) ([place]) 01/28/2016  . Essential hypertension 01/28/2016  . History of cardioembolic stroke 66/29/4765  . Personal history of subarachnoid hemorrhage 01/28/2016  . CKD (chronic kidney disease), stage IV ([place]) 01/28/2016  . Gait disturbance 01/27/2016  . Mitral valve disease 01/08/2014  . Colon polyps 07/10/2013  . Lumbar radiculopathy 02/04/2011    Past Surgical History:  Procedure Laterality Date  . APPENDECTOMY  child  . AV FISTULA PLACEMENT Right 03/31/2017   Procedure: Right ARM Basilic vein transposition;  Surgeon: Conrad [place] Bangor, MD;  Location: [facility];  Service: Vascular;  Laterality: Right;  . AV FISTULA PLACEMENT Right 12/06/2017   Procedure: INSERTION OF ARTERIOVENOUS (AV) GORE-TEX GRAFT ARM RIGHT UPPER EXTREMITY;  Surgeon: [name], MD;  Location: [facility];  Service: Vascular;  Laterality: Right;  . CARDIOVASCULAR STRESS TEST  01/31/2016   Stress myoview: NORMAL/Low risk.  EF 56%.  . [place]  . COLONOSCOPY  2015   + hx of adenomatous polyps.  Need digest health spec in [place] records to see when pt due for next colonoscopy  . DECLOT CKV Right 03/21/2019  . EYE SURGERY Bilateral    Catarct  . INSERTION OF DIALYSIS CATHETER Right 01/29/2017   Procedure: INSERTION OF DIALYSIS CATHETER- RIGHT INTERNAL JUGULAR;  Surgeon: [name], MD;  Location: [facility];  Service: Vascular;  Laterality: Right;  . INTRAMEDULLARY (IM) NAIL INTERTROCHANTERIC Right 10/04/2018   Procedure: INTRAMEDULLARY (IM) NAIL INTERTROCHANTRIC;  Surgeon: Nicholes Stairs, MD;  Location: [facility];  Service: Orthopedics;  Laterality: Right;  . IR GENERIC HISTORICAL  07/24/2016   IR KYPHO THORACIC WITH BONE BIOPSY 07/24/2016 Luanne Bras, MD [facility]INTER[facility] RAD  . IR KYPHO THORACIC WITH BONE BIOPSY  09/06/2019  . KYPHOPLASTY  07/27/2016   T8  . PACEMAKER IMPLANT N/A 12/03/2016   Procedure: Pacemaker Implant;  Surgeon: Evans Lance, MD;  Location: [facility];  Service: Cardiovascular;  Laterality: N/A;  . Renal artery dopplers  02/26/2016   Her right renal dimension was 11 cm pole to pole with mild to moderate right renal artery stenosis. A right renal aortic ratio was 3.22 suggesting less than a 50% stenosis.  . Right upper arm AV Gore-Tex graft  12/06/2017   Dr. Donnetta Hutching  . TONSILLECTOMY    . TRANSTHORACIC ECHOCARDIOGRAM  01/29/2016; 11/2016; 12/2017   2017: EF 55-60%, normal LV wall motion, grade I DD.  No cardiac source of emboli was seen.  2018: EF 35-40%, could not assess DD due to a-fib, pulm HTN noted. 12/2017 EF 55-60%, nl  wall motion, grd II DD, mod MR and pulm regurg, increased pulm pressure (peak 52).     OB History   No obstetric history on file.     Family History  Problem Relation Age of Onset  . Cancer Mother   . Heart disease Father   . Early death Father   . Sudden Cardiac Death Neg Hx     Social History   Tobacco Use  . Smoking status: Former Smoker    Years: 22.00    Quit date: 03/17/1972    Years since quitting: 47.5  . Smokeless tobacco: Never Used  Substance Use  Topics  . Alcohol use: Yes    Alcohol/week: 1.0 standard drinks    Types: 1 Glasses of wine per week    Comment: wine  . Drug use: No    Home Medications Prior to Admission medications   Medication Sig Start Date End Date Taking? Authorizing Provider  acetaminophen (TYLENOL) 500 MG tablet Take 500-1,000 mg 3 (three) times daily as needed by mouth for moderate pain.     [provider]  Amino Acids-Protein Hydrolys (FEEDING SUPPLEMENT, PRO-STAT SUGAR FREE 64,) LIQD Take 30 mLs by mouth 2 (two) times daily. 09/08/19   Pokhrel, Corrie Mckusick, MD  amiodarone (PACERONE) 200 MG tablet TAKE 1 TABLET BY MOUTH  DAILY Patient taking differently: Take 200 mg by mouth daily.  03/27/19   Lorretta Harp, MD  Artificial Saliva (ACT DRY MOUTH) LOZG Use as directed 1 lozenge in the mouth or throat daily as needed (for dry mouth).    [provider]  aspirin EC 325 MG tablet Take 325 mg by mouth every evening.    [provider]  b complex-vitamin c-folic acid (NEPHRO-VITE) 0.8 MG TABS tablet Take 1 tablet by mouth See admin instructions. Take one tablet by mouth on Tuesday, Thursday, Saturday - after dialysis 09/07/18   [provider]  calcitonin, salmon, (MIACALCIN/FORTICAL) 200 UNIT/ACT nasal spray Place 1 spray into alternate nostrils daily.  07/06/19   [provider]  D-5000 125 MCG (5000 UT) TABS Take 1 tablet by mouth daily. 08/18/19   [provider]  DENTA 5000 PLUS 1.1 % CREA dental  cream Place 1 application onto teeth at bedtime.  04/06/19   [provider]  docusate sodium (COLACE) 100 MG capsule Take 100 mg by mouth daily as needed for mild constipation.     [provider]  doxercalciferol (HECTOROL) 2.5 MCG capsule Take 2.5 mcg by mouth 3 (three) times a week.  02/28/19 02/27/20  [provider]  heparin 1000 unit/mL SOLN injection 1,000 Units by Dialysis route one time in dialysis.  02/07/19 02/06/20  [provider]  hydrocortisone cream 1 % Apply topically 4 (four) times daily. 09/08/19   Pokhrel, Corrie Mckusick, MD  isosorbide mononitrate (IMDUR) 60 MG 24 hr tablet TAKE 1 TABLET BY MOUTH  DAILY ; TAKE 1 TABLET ON  SUN, MON, WED, FRI MORNINGS AND 1 TABLET ON TUES, THU,  SAT AFTER DIALYSIS Patient taking differently: Take 60 mg by mouth daily.  07/21/19   McGowen, Adrian Blackwater, MD  Lidocaine 4 % PTCH Apply 1 patch topically daily as needed (Mild pain).    [provider]  lidocaine-prilocaine (EMLA) cream Apply 1 application topically See admin instructions. Apply small amount to access site (AVF) one hour before dialysis, cover with occlusive dressing (saran wrap) 11/11/18   [provider]  melatonin 3 MG TABS tablet Take 2 tablets (6 mg total) by mouth at bedtime as needed (sleep). 09/08/19   Pokhrel, Corrie Mckusick, MD  Multiple Vitamins-Minerals (MULTIVITAMIN WITH MINERALS) tablet Take 1 tablet by mouth at bedtime.     [provider]  naloxone [facility]) 0.4 MG/ML injection Inject IM as needed for reversal of opioid pain medication Patient not taking: Reported on 08/31/2019 07/20/19   Tammi Sou, MD  ondansetron (ZOFRAN) 4 MG tablet Take 1 tablet (4 mg total) by mouth every 8 (eight) hours as needed for nausea or vomiting. 07/12/19   McGowen, Adrian Blackwater, MD  Oxycodone HCl 10 MG TABS Take 2 tablets (20 mg total) by mouth  every 6 (six) hours as needed (for pain). 09/08/19   Pokhrel, Corrie Mckusick, MD  polyethylene glycol (MIRALAX / GLYCOLAX)  17 g packet Take 17 g by mouth daily as needed for mild constipation. 09/08/19   Pokhrel, Corrie Mckusick, MD  polyvinyl alcohol (LIQUIFILM TEARS) 1.4 % ophthalmic solution Place 1-2 drops into both eyes daily as needed for dry eyes.     [provider]  pravastatin (PRAVACHOL) 40 MG tablet TAKE 1 TABLET BY MOUTH  EVERY EVENING Patient taking differently: Take 40 mg by mouth at bedtime.  03/09/18   McGowen, Adrian Blackwater, MD  sevelamer carbonate (RENVELA) 800 MG tablet Take 1,600 mg by mouth 3 (three) times daily with meals.     [provider]  vitamin C (ASCORBIC ACID) 500 MG tablet Take 500 mg by mouth daily.    [provider]  Vitamin D, Ergocalciferol, (DRISDOL) 1.25 MG (50000 UT) CAPS capsule Take 1 capsule (50,000 Units total) by mouth every Thursday. 04/28/18   McGowen, Adrian Blackwater, MD    Allergies    Clonidine derivatives, Adhesive [tape], Codeine, Nickel, and Sulfa antibiotics  Review of Systems   Review of Systems  All other systems reviewed and are negative.   Physical Exam Updated Vital Signs BP (!) 111/92   Pulse 88   Temp 98.9 F (37.2 C) (Oral)   Resp 18   Ht 4\' 11"  (1.499 m)   Wt 50 kg   LMP  (LMP Unknown)   SpO2 99%   BMI 22.26 kg/m   Physical Exam Vitals and nursing note reviewed.   Frail-appearing 86 year old female, resting comfortably and in no acute distress. Vital signs are normal. Oxygen saturation is 99%, which is normal, but only obtained with use of supplemental oxygen. Head is normocephalic and atraumatic. PERRLA, EOMI. Oropharynx is clear. Neck is nontender and supple without adenopathy or JVD. Back is nontender and there is no CVA tenderness. Lungs are clear without rales, wheezes, or rhonchi. Chest is nontender. Heart has an irregular rhythm with 3/6 systolic ejection murmur heard along the left sternal border. Abdomen is soft, flat, nontender without masses or hepatosplenomegaly and peristalsis is normoactive. Extremities have no  cyanosis or edema, full range of motion is present. Skin is warm and dry without rash. Neurologic: Awake and oriented to person, knows it is [place] but does not know where she is, does not know the date or day or year. Cranial nerves are intact.  Although she has generalized weakness, there is no focal weakness.  ED Results / Procedures / Treatments   Labs (all labs ordered are listed, but only abnormal results are displayed) Labs Reviewed  COMPREHENSIVE METABOLIC PANEL - Abnormal; Notable for the following components:      Result Value   Chloride 97 (*)    Creatinine, Ser 3.11 (*)    Calcium 7.9 (*)    Total Protein 6.1 (*)    Albumin 2.3 (*)    GFR calc non Af Amer 13 (*)    GFR calc Af Amer 15 (*)    All other components within normal limits  CBC - Abnormal; Notable for the following components:   WBC 15.4 (*)    RBC 3.80 (*)    Hemoglobin 11.8 (*)    RDW 17.2 (*)    All other components within normal limits  RESPIRATORY PANEL BY RT PCR (FLU A&B, COVID)  PROCALCITONIN  LACTIC ACID, PLASMA  LACTIC ACID, PLASMA  CBG MONITORING, ED   Radiology CT Head  Wo Contrast  Result Date: 09/14/2019 CLINICAL DATA:  Altered mental status. EXAM: CT HEAD WITHOUT CONTRAST TECHNIQUE: Contiguous axial images were obtained from the base of the skull through the vertex without intravenous contrast. COMPARISON:  September 09, 2019 FINDINGS: Brain: There is mild cerebral atrophy with widening of the extra-axial spaces and ventricular dilatation. There are areas of decreased attenuation within the white matter tracts of the supratentorial brain, consistent with microvascular disease changes. Small chronic bilateral basal ganglia lacunar infarcts are seen. Vascular: No hyperdense vessel or unexpected calcification. Skull: Normal. Negative for fracture or focal lesion. Sinuses/Orbits: No acute finding. Other: None. IMPRESSION: 1. Generalized cerebral atrophy. 2. No acute intracranial abnormality.  Electronically Signed   By: Virgina Norfolk M.D.   On: 09/14/2019 19:23   DG Chest Port 1 View  Result Date: 09/14/2019 CLINICAL DATA:  Altered mental status. The fell and hit head on 04/24, followed by a normal head CT. EXAM: PORTABLE CHEST 1 VIEW COMPARISON:  08/31/2019 FINDINGS: Patient has LEFT-sided transvenous pacemaker with leads to the RIGHT atrium and RIGHT ventricle. The heart is enlarged. There is increased opacity at the LEFT lung base, consistent with atelectasis or infiltrate. Suspect LEFT pleural effusion. The increased prominence of interstitial markings raises a question of interstitial edema. IMPRESSION: 1. Cardiomegaly and possible interstitial edema. 2. Increased LEFT lower lobe atelectasis or infiltrate. 3. Probable LEFT pleural effusion. Electronically Signed   By: Nolon Nations M.D.   On: 09/14/2019 19:18    Procedures Procedures  Medications Ordered in ED Medications  cefTRIAXone (ROCEPHIN) 2 g in sodium chloride 0.9 % 100 mL IVPB (has no administration in time range)  azithromycin (ZITHROMAX) 500 mg in sodium chloride 0.9 % 250 mL IVPB (has no administration in time range)    ED Course  I have reviewed the triage vital signs and the nursing notes.  Pertinent labs & imaging results that were available during my care of the patient were reviewed by me and considered in my medical decision making (see chart for details).   MDM Rules/Calculators/A&P Weakness and confusion, both progressing.  Old records are reviewed confirming recent hospitalization for diverticulitis which had resolved, altered mental status noted during that hospital stay but felt to be secondary to combination of environment change and narcotic use.  Labs today are unremarkable.  She has hypocalcemia, but this is in the same range that she has been in recently.  Anemia of renal failure is present, actually improved compared with baseline.  Will check chest x-ray and CT of head.  CT of head shows  no acute changes.  Chest x-ray appears to show new left lower lobe infiltrate with small left pleural effusion.  She is started on antibiotics for community-acquired pneumonia.  Case is discussed with Dr. Doristine Bosworth of [facility], who agrees to admit the patient.  Final Clinical Impression(s) / ED Diagnoses Final diagnoses:  Confusion  Community acquired pneumonia of left lower lobe of lung  End-stage renal disease on hemodialysis ([place])  Anemia associated with chronic renal failure    Rx / DC Orders ED Discharge Orders    None       Delora Fuel, MD 81/10/31 2110

## 2019-09-15 ENCOUNTER — Other Ambulatory Visit: Payer: Self-pay

## 2019-09-15 ENCOUNTER — Other Ambulatory Visit: Payer: Medicare Other | Admitting: Internal Medicine

## 2019-09-15 DIAGNOSIS — Z66 Do not resuscitate: Secondary | ICD-10-CM

## 2019-09-15 DIAGNOSIS — Z992 Dependence on renal dialysis: Secondary | ICD-10-CM | POA: Diagnosis not present

## 2019-09-15 DIAGNOSIS — R609 Edema, unspecified: Secondary | ICD-10-CM | POA: Diagnosis not present

## 2019-09-15 DIAGNOSIS — J9601 Acute respiratory failure with hypoxia: Secondary | ICD-10-CM | POA: Diagnosis not present

## 2019-09-15 DIAGNOSIS — Z515 Encounter for palliative care: Secondary | ICD-10-CM

## 2019-09-15 DIAGNOSIS — Z7189 Other specified counseling: Secondary | ICD-10-CM

## 2019-09-15 DIAGNOSIS — N186 End stage renal disease: Secondary | ICD-10-CM | POA: Diagnosis not present

## 2019-09-15 LAB — CBC
HCT: 33.2 % — ABNORMAL LOW (ref 36.0–46.0)
Hemoglobin: 10.3 g/dL — ABNORMAL LOW (ref 12.0–15.0)
MCH: 30.5 pg (ref 26.0–34.0)
MCHC: 31 g/dL (ref 30.0–36.0)
MCV: 98.2 fL (ref 80.0–100.0)
Platelets: 222 10*3/uL (ref 150–400)
RBC: 3.38 MIL/uL — ABNORMAL LOW (ref 3.87–5.11)
RDW: 17.1 % — ABNORMAL HIGH (ref 11.5–15.5)
WBC: 10.9 10*3/uL — ABNORMAL HIGH (ref 4.0–10.5)
nRBC: 0 % (ref 0.0–0.2)

## 2019-09-15 LAB — COMPREHENSIVE METABOLIC PANEL
ALT: 9 U/L (ref 0–44)
AST: 18 U/L (ref 15–41)
Albumin: 2.1 g/dL — ABNORMAL LOW (ref 3.5–5.0)
Alkaline Phosphatase: 89 U/L (ref 38–126)
Anion gap: 13 (ref 5–15)
BUN: 15 mg/dL (ref 8–23)
CO2: 30 mmol/L (ref 22–32)
Calcium: 8.3 mg/dL — ABNORMAL LOW (ref 8.9–10.3)
Chloride: 96 mmol/L — ABNORMAL LOW (ref 98–111)
Creatinine, Ser: 3.91 mg/dL — ABNORMAL HIGH (ref 0.44–1.00)
GFR calc Af Amer: 12 mL/min — ABNORMAL LOW (ref >60)
GFR calc non Af Amer: 10 mL/min — ABNORMAL LOW (ref >60)
Glucose, Bld: 94 mg/dL (ref 70–99)
Potassium: 3.6 mmol/L (ref 3.5–5.1)
Sodium: 139 mmol/L (ref 135–145)
Total Bilirubin: 0.7 mg/dL (ref 0.3–1.2)
Total Protein: 5.8 g/dL — ABNORMAL LOW (ref 6.5–8.1)

## 2019-09-15 LAB — HIV ANTIBODY (ROUTINE TESTING W REFLEX): HIV Screen 4th Generation wRfx: NONREACTIVE

## 2019-09-15 MED ORDER — SODIUM CHLORIDE 0.9 % IV SOLN
2.0000 g | INTRAVENOUS | Status: DC
  Administered 2019-09-15 – 2019-09-19 (×5): 2 g via INTRAVENOUS
  Filled 2019-09-15: qty 2
  Filled 2019-09-15: qty 20
  Filled 2019-09-15: qty 2
  Filled 2019-09-15 (×2): qty 20
  Filled 2019-09-15: qty 2
  Filled 2019-09-15: qty 20

## 2019-09-15 MED ORDER — FENTANYL CITRATE (PF) 100 MCG/2ML IJ SOLN: 25.0000 ug | INTRAMUSCULAR | Status: DC | PRN

## 2019-09-15 MED ORDER — VANCOMYCIN HCL 500 MG/100ML IV SOLN: 500.0000 mg | INTRAVENOUS | Status: DC

## 2019-09-15 MED ORDER — BISACODYL 10 MG RE SUPP: 10.0000 mg | Freq: Every day | RECTAL | Status: DC | PRN

## 2019-09-15 MED ORDER — RENA-VITE PO TABS
1.0000 | ORAL_TABLET | Freq: Every day | ORAL | Status: DC
  Administered 2019-09-15 – 2019-09-19 (×5): 1 via ORAL
  Filled 2019-09-15 (×4): qty 1

## 2019-09-15 MED ORDER — ENSURE ENLIVE PO LIQD
237.0000 mL | Freq: Three times a day (TID) | ORAL | Status: DC
  Administered 2019-09-15 – 2019-09-20 (×9): 237 mL via ORAL

## 2019-09-15 MED ORDER — CHLORHEXIDINE GLUCONATE CLOTH 2 % EX PADS
6.0000 | MEDICATED_PAD | Freq: Every day | CUTANEOUS | Status: DC
  Administered 2019-09-16 – 2019-09-20 (×5): 6 via TOPICAL

## 2019-09-15 NOTE — Evaluation (Signed)
Occupational Therapy Treatment Patient Details Name: Valerie Terrell MRN: [id number] DOB: 1932/10/15 Today's Date: 09/15/2019    History of present illness Mrs. Southern is a 86 y.o. F with hx CHF, chronic hypoxic respiratory failure on 2L, ESRD on HD TThS, HTN, pAF no longer on AC, NICM and SSS with pacer and hx stroke who presented from HD with hypoxia, decreased sensorium, confusion and generalized weakness for 1-2 days.  Found to have HCAP.   OT comments  Pt admitted with above. She demonstrates the below listed deficits and will benefit from continued OT to maximize safety and independence with BADLs.  Pt presents to OT with decreased activity tolerance, impaired balance, generalized weakness, impaired cognition.  She currently requires mod - total A for ADLs and max A +2 for functional transfers. She has been living with her daughter since last admission, and has been requiring assist for all ADLs and max A for transfers.  Daughter reports she is unable to provide current level of care pt now requires.  Recommend SNF level rehab at discharge.     Follow Up Recommendations  SNF;Supervision/Assistance - 24 hour    Equipment Recommendations  None recommended by OT    Recommendations for Other Services      Precautions / Restrictions Precautions Precautions: Fall       Mobility Bed Mobility Overal bed mobility: Needs Assistance Bed Mobility: Supine to Sit;Sit to Supine     Supine to sit: Max assist Sit to supine: Max assist   General bed mobility comments: requires assist for all aspects   Transfers Overall transfer level: Needs assistance Equipment used: 1 person hand held assist Transfers: Sit to/from [employer] Transfers Sit to Stand: Max assist;+2 safety/equipment Stand pivot transfers: Max assist;+2 safety/equipment       General transfer comment: Pt demonstrates posterior bias.  She requires assist to move into standing and for balance     Balance  Overall balance assessment: Needs assistance Sitting-balance support: Feet supported;Single extremity supported Sitting balance-Leahy Scale: Poor Sitting balance - Comments: Pt requires very close min guard assist to min A to maintain static sitting in straight back chair.  Pt tends to slide hips forward placing her at risk for falls    Standing balance support: Bilateral upper extremity supported Standing balance-Leahy Scale: Poor Standing balance comment: requires max A                            ADL either performed or assessed with clinical judgement   ADL Overall ADL's : Needs assistance/impaired Eating/Feeding: Bed level;Minimal assistance   Grooming: Wash/dry hands;Wash/dry face;Oral care;Minimal assistance;Bed level   Upper Body Bathing: Moderate assistance;Bed level   Lower Body Bathing: Maximal assistance;Bed level;Sit to/from stand   Upper Body Dressing : Maximal assistance;Sitting   Lower Body Dressing: Total assistance;Sit to/from stand   Toilet Transfer: Maximal assistance;Stand-pivot;BSC;RW   Toileting- Clothing Manipulation and Hygiene: Total assistance;Sit to/from stand       Functional mobility during ADLs: Rolling walker;Maximal assistance       Vision Patient Visual Report: No change from baseline     Perception     Praxis      Cognition Arousal/Alertness: Awake/alert Behavior During Therapy: WFL for tasks assessed/performed Overall Cognitive Status: Impaired/Different from baseline Area of Impairment: Orientation;Attention;Memory;Safety/judgement;Awareness;Problem solving                 Orientation Level: Disoriented to;Time Current Attention Level: Sustained Memory: Decreased short-term memory  Following Commands: Follows one step commands consistently;Follows one step commands with increased time     Problem Solving: Slow processing;Decreased initiation;Difficulty sequencing;Requires verbal cues;Requires tactile cues           Exercises Exercises: Other exercises Other Exercises Other Exercises: warm up ROM prior to mobility   Shoulder Instructions       General Comments Daugter present during the session    Pertinent Vitals/ Pain       Pain Assessment: No/denies pain  Home Living Family/patient expects to be discharged to:: Skilled nursing facility                                 Additional Comments: Pt discharged home with daughter after previous admission at the end of April.  Daughter reports she is unable to provide the necessary level of assist that pt currently requires       Prior Functioning/Environment Level of Independence: Needs assistance  Gait / Transfers Assistance Needed: Pt has been unable to ambulate since discharge from hospital at end of April.  Daughter has been providing max A for transfers  ADL's / Homemaking Assistance Needed: Pt has been requiring assist with all ADLs since last admission in April        Frequency  Min 2X/week        Progress Toward Goals  OT Goals(current goals can now be found in the care plan section)     Acute Rehab OT Goals Patient Stated Goal: for pt to get stronger and be able to do more for self  OT Goal Formulation: With patient/family Time For Goal Achievement: 09/29/19 Potential to Achieve Goals: Fair ADL Goals Pt Will Perform Grooming: with set-up;sitting;with supervision Pt Will Perform Upper Body Bathing: with min assist;sitting Pt Will Transfer to Toilet: with mod assist;stand pivot transfer;bedside commode Pt Will Perform Toileting - Clothing Manipulation and hygiene: with mod assist;sit to/from stand  Plan      Co-evaluation    PT/OT/SLP Co-Evaluation/Treatment: Yes Reason for Co-Treatment: Complexity of the patient's impairments (multi-system involvement);Necessary to address cognition/behavior during functional activity;For patient/therapist safety;To address functional/ADL transfers PT goals addressed  during session: Mobility/safety with mobility OT goals addressed during session: ADL's and self-care      AM-PAC OT "6 Clicks" Daily Activity     Outcome Measure   Help from another person eating meals?: A Little Help from another person taking care of personal grooming?: A Little Help from another person toileting, which includes using toliet, bedpan, or urinal?: A Lot Help from another person bathing (including washing, rinsing, drying)?: A Lot Help from another person to put on and taking off regular upper body clothing?: A Lot Help from another person to put on and taking off regular lower body clothing?: Total 6 Click Score: 13    End of Session    OT Visit Diagnosis: Unsteadiness on feet (R26.81);Cognitive communication deficit (R41.841)   Activity Tolerance Patient limited by fatigue   Patient Left in bed;with call bell/phone within reach;with family/visitor present   Nurse Communication Mobility status        Time: 6468-0321 OT Time Calculation (min): 26 min  Charges: OT General Charges $OT Visit: 1 Visit OT Evaluation $OT Eval Moderate Complexity: 1 Mod  Nilsa Nutting., OTR/L Acute Rehabilitation Services Pager [phone] Office [place], [place] 09/15/2019, 11:51 AM

## 2019-09-15 NOTE — Consult Note (Signed)
Palliative Medicine Inpatient Consult Note  Reason for consult:  Discuss goals of care, possibly stopping HD, transition to Hospice  HPI:  Per intake H&P --> Valerie Valerie Terrell is a 86 y.o. female with PMH PAF, hx CVA, ESRD, T12 compression, NICM with pacemaker, HTN, HLD and recent diverticulitis who presented to ER for altered mental status and weakness and found to have HCAP.  Palliative care was involved upon Valerie [place] Terrell last admission to aid in symptom management and ongoing [place]erie [place] conversations.   Clinical Assessment/Goals of Care: I have reviewed medical records including EPIC notes, labs and imaging, received report from bedside RN, assessed the patient.    I met with Valerie Valerie Terrell at bedside and called Valerie [place] Ter[place] to further discuss diagnosis prognosis, GOC, EOL wishes, disposition and options.   I introduced Palliative Medicine as specialized medical care for people living with serious illness. It focuses on providing relief from the symptoms and stress of a serious illnes's. The goal is to improve quality of life for both the patient and the family. Valerie Valerie Terrell and her daughter shared that she is presently getting Palliative services through [place] in her home as of very recently.  I asked Valerie Valerie Terrell to tell me about herself. 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 She said that she was born in [place] though she has lived in [place] for quite some time. She is divorced and has one daughter, Valerie Valerie Terrell who lives with her in [place]. She said that she use to enjoy gardening quite a bit. She is a faithful women and practices as a [employer].   I asked Valerie Val[place] Ter[place] how things had been going since she went home. She shares that things have not been going well. She has been falling at home. She said that she has become completely dependant upon her daughter. Valerie Valerie Terrell states that her care needs are too much for her daughter at the present time.    Per ongoing conversation with patients daughter, Valerie Valerie Terrell has become completely physically dependent at home. Valerie Valerie Ter[place] shares that as of Tuesday evening of this past week Valerie [place] had stated that she wanted to continue all measures to get stronger and live a more functional life. Valerie Valerie Terrell shares that she has seen her mother decline as of recently. We discussed meeting collectively to discuss the present trajectory.  Concepts specific to code status, artifical feeding and hydration, continued IV antibiotics and rehospitalization was had.  At this point in time [place] and I discussed continuation of DNAR/DNI code status.   We discussed her HD treatments which are prolonging her life. She shares with me that she would want to enjoy the quality of the time she has left as opposed to the quantity of time. 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   The difference between a aggressive medical intervention path and a palliative comfort care path for this patient at this time was had. Discussed the topic of hospice and their values which is preserving dignity and quality at the end of life. Valerie Terrell states that this is what she would want though she wants to speak with her daughter, Valerie Valerie Terrell. I shared with her that I [place] be present all day to help guide she and her daughter through these difficult decisions. They both have agreed that if comfort oriented care is sought she would likely want transition to [place].   Discussed  the importance of continued conversation with family and their  medical providers regarding overall plan of care and treatment options, ensuring decisions are within the context of the patients values and GOCs.  Decision Maker: Valerie Valerie Ter[place] (Daughter) [phone]  SUMMARY OF RECOMMENDATIONS   DNAR/DNI  Treat what is treatable for the time being  Continue HD for the time being  Plan to have a formal family meeting tomorrow to further  discuss HD cessation and full comfort oriented care  Chaplain Consult  Code Status/Advance Care Planning: DNAR/DNI   Symptom Management:  Lower Back pain:                 - Fentanyl 38mg Q2H PRN  Constipation:                 - Bisacodyl 185mPR PRN  Muscular Weakness:                 - PT/OT consults, recommended SNF  Delirium: - Delirium precautions - Get up during the day - Encourage a familiar face to remain present throughout the day - Keep blinds open and lights on during daylight hours - Minimize the use of opioids/benzodiazepines  Spiritual: - Chaplain consult  Need for emotional support:                 - Utilize therapeutic listening                 - Ensure safe space to express concerns    Palliative Prophylaxis:   Constipation, oral care, repositioning  Additional Recommendations (Limitations, Scope, Preferences):  Treat what is treatable   Psycho-social/Spiritual:   Desire for further Chaplaincy support: Yes  Additional Recommendations: Education on residential hospice care   Prognosis:   Poor in the setting of worsening debility. PPS 30%   Discharge Planning: Ongoing discussions likely SNF versus residential hospice. [place] depend on patients goals.   This conversation/these recommendations were discussed with patient primary care team, Dr. DaLoleta BooksTime In: 1330 Time Out: 1440 Total Time: 7072reater than 50%  of this time was spent counseling and coordinating care related to the above assessment and plan.  [facility] Team Cell Phone: [phone] utilize secure chat with additional questions, if there is no response within 30 minutes please call the above phone number  Palliative Medicine Team providers are available by phone from 7am to 7pm daily and can be reached through the team cell  phone.  Should this patient require assistance outside of these hours, please call the patient's attending physician.

## 2019-09-15 NOTE — Progress Notes (Signed)
PROGRESS NOTE    Sandie Swayze  MWN:[id number] DOB: 06/08/1932 DOA: 09/14/2019 PCP: Tammi Sou, MD      Brief Narrative:  Mrs. Bells is a 86 y.o. F with hx CHF, chronic hypoxic respiratory failure on 2L, ESRD on HD TThS, HTN, pAF no longer on AC, NICM and SSS with pacer and hx stroke who presented from HD with hypoxia, decreased sensorium, confusion and generalized weakness for 1-2 days.  Patient recently admitted for a week for confusion, discharged 1 week ago.  Diagnosed with acute diverticulitis complicated by Q03 compression fracture and delirium.  Got kyphoplasty, was discharged to home, was doing better until a few days ago.  On day of admission, HHPT noted hypoxia, but patient was well enough to go to HD.  There she was hypoxic again, too sleepy on HD to tolerate, and was sent to ER.  In the ER, WBC 15K.  CXR showed new left effusion and infiltrate.              Assessment & Plan:  Community acquired pneumonia left lower lobe Recent Abx, but mild disease, no history drug resistant infections. -Stop Vanc/Zosyn -Start ceftriaxone   ESRD on HD TThS -Consult nephrology for HD  Chronic systolic and diastolic CHF Chronic hypoxic respiratory failure Non-ischemic cardiomyopathy SSS with pacer  Paroxysmal atrial fibrillation Not on AC due to recurrent falls. -Continue amiodarone  Hypertension Cerebrovascular disease secondary prevention BP normal -Continue Imdur, statin  Stage I Pressure injury sacrum, POA  Failure to thrive As evidenced by BMI 22, chronic renal and cardiac failure, poor PO intake, pressure injury.            Disposition: Status is: Inpatient  Remains inpatient appropriate because:Altered mental status and Inpatient level of care appropriate due to severity of illness   Dispo: The patient is from: Home              Anticipated d/c is to: SNF              Anticipated d/c date is: 2 days              Patient  currently is not medically stable to d/c.   The patient's quick readmission with a second infection portends a worsening prognosis.  At present, she is too weak to stand, and remains slightly confused.    I have broached the subject of stopping dialysis. I recommend palliative care consultation to continue this discussion with Nephrology, and potentially transition to Hospice at discharge.            MDM: The below labs and imaging reports were reviewed and summarized above.  Medication management as above.  This is a severe acute illness complicated by chronic multiorgan failure.  DVT prophylaxis: Heparin Code Status: DO NOT RESUSCITATE on admission Family Communication: Daughter at bedside    Consultants:   Nephrology  Palliative Care  Procedures:     Antimicrobials:   Ceftriaxone 4/29 >>  Vanc/Zosyn x1 4/29   Culture data:   4/30 blood culture pending           Subjective: The patietn is still slightly confused.  She was very somnolent overnight, better today.   No fever, no vomiting.  She has mild cough.  No chest pain, respiratory distress, abdominal pain.  Objective: Vitals:   09/15/19 0504 09/15/19 0515 09/15/19 0630 09/15/19 0701  BP: 135/63 116/66 129/81 (!) 154/63  Pulse: 79 77  79  Resp:    16  Temp:      TempSrc:      SpO2: 97% 91%  100%  Weight:      Height:        Intake/Output Summary (Last 24 hours) at 09/15/2019 5093 Last data filed at 09/15/2019 0033 Gross per 24 hour  Intake 200 ml  Output --  Net 200 ml   Filed Weights   09/14/19 1616  Weight: 50 kg    Examination: General appearance: thin elderly adult female, awake but slightly ocnfused, no obvious distress.   HEENT: Anicteric, conjunctiva pink, lids and lashes normal. No nasal deformity, discharge, epistaxis.  Lips moist, dentition good repair, OP moist, no oral lesions, hearing diminished.   Skin: Warm and dry.  No jaundice.  No suspicious rashes or lesions.   Bruising on face. Cardiac: RRR, nl S1-S2, no murmurs appreciated.  Capillary refill is brisk.  JVP not visible.  No LE edema.  Radial pulses 2+ and symmetric. Respiratory: Normal respiratory rate and rhythm.  CTAB without rales or wheezes.  Weak inspiratory effort, lung sounds very diminished. Abdomen: Abdomen soft.  no TTP or gaurding. No ascites, distension, hepatosplenomegaly.   MSK: No deformities or effusions.  Diffuse loss of muscel mass Neuro: Awake and alert.  EOMI, moves all extremities with severe genearlized weakness. Speech fluent.    Psych: Sensorium intact and responding to questions, attention diminished, psychomotor slowing noted, attention normal. Affect blunted.  Judgment and insight appear moderately impaired.    Data Reviewed: I have personally reviewed following labs and imaging studies:  CBC: Recent Labs  Lab 09/09/19 0832 09/14/19 1624  WBC 8.2 15.4*  HGB 10.7* 11.8*  HCT 34.1* 37.5  MCV 99.1 98.7  PLT 259 267   Basic Metabolic Panel: Recent Labs  Lab 09/09/19 0832 09/14/19 1624  NA 135 140  K 4.0 3.7  CL 98 97*  CO2 24 28  GLUCOSE 88 92  BUN 19 11  CREATININE 3.78* 3.11*  CALCIUM 8.7* 7.9*  PHOS 3.5  --    GFR: Estimated Creatinine Clearance: 9.2 mL/min (A) (by C-G formula based on SCr of 3.11 mg/dL (H)). Liver Function Tests: Recent Labs  Lab 09/09/19 0832 09/14/19 1624  AST  --  21  ALT  --  6  ALKPHOS  --  104  BILITOT  --  0.5  PROT  --  6.1*  ALBUMIN 2.0* 2.3*   No results for input(s): LIPASE, AMYLASE in the last 168 hours. No results for input(s): AMMONIA in the last 168 hours. Coagulation Profile: No results for input(s): INR, PROTIME in the last 168 hours. Cardiac Enzymes: No results for input(s): CKTOTAL, CKMB, CKMBINDEX, TROPONINI in the last 168 hours. BNP (last 3 results) No results for input(s): PROBNP in the last 8760 hours. HbA1C: No results for input(s): HGBA1C in the last 72 hours. CBG: Recent Labs  Lab  09/14/19 2121  GLUCAP 88   Lipid Profile: No results for input(s): CHOL, HDL, LDLCALC, TRIG, CHOLHDL, LDLDIRECT in the last 72 hours. Thyroid Function Tests: No results for input(s): TSH, T4TOTAL, FREET4, T3FREE, THYROIDAB in the last 72 hours. Anemia Panel: No results for input(s): VITAMINB12, FOLATE, FERRITIN, TIBC, IRON, RETICCTPCT in the last 72 hours. Urine analysis:    Component Value Date/Time   COLORURINE YELLOW 01/20/2017 [place] 01/20/2017 1737   LABSPEC 1.012 01/20/2017 1737   PHURINE 5.0 01/20/2017 1737   GLUCOSEU NEGATIVE 01/20/2017 1737   HGBUR NEGATIVE 01/20/2017 1737   BILIRUBINUR NEGATIVE 01/20/2017 1737  BILIRUBINUR negative 09/24/2016 1453   KETONESUR NEGATIVE 01/20/2017 1737   PROTEINUR 30 (A) 01/20/2017 1737   UROBILINOGEN 0.2 09/24/2016 1453   NITRITE NEGATIVE 01/20/2017 1737   LEUKOCYTESUR NEGATIVE 01/20/2017 1737   Sepsis Labs: @LABRCNTIP (procalcitonin:4,lacticacidven:4)  ) Recent Results (from the past 240 hour(s))  SARS CORONAVIRUS 2 (TAT 6-24 HRS) Nasopharyngeal Nasopharyngeal Swab     Status: None   Collection Time: 09/07/19  7:03 PM   Specimen: Nasopharyngeal Swab  Result Value Ref Range Status   SARS Coronavirus 2 NEGATIVE NEGATIVE Final    Comment: (NOTE) SARS-CoV-2 target nucleic acids are NOT DETECTED. The SARS-CoV-2 RNA is generally detectable in upper and lower respiratory specimens during the acute phase of infection. Negative results do not preclude SARS-CoV-2 infection, do not rule out co-infections with other pathogens, and should not be used as the sole basis for treatment or other patient management decisions. Negative results must be combined with clinical observations, patient history, and epidemiological information. The expected result is Negative. Fact Sheet for Patients: SugarRoll.be Fact Sheet for Healthcare Providers: https://www.woods-mathews.com/ This test  is not yet approved or cleared by the [place] FDA and  has been authorized for detection and/or diagnosis of SARS-CoV-2 by FDA under an Emergency Use Authorization (EUA). This EUA will remain  in effect (meaning this test can be used) for the duration of the COVID-19 declaration under Section 56 4(b)(1) of the Act, 21 U.S.C. section 360bbb-3(b)(1), unless the authorization is terminated or revoked sooner. Performed at [facility], [place] [address], [place], [place] [address]   Respiratory Panel by RT PCR (Flu A&B, Covid) - Nasopharyngeal Swab     Status: None   Collection Time: 09/14/19  6:46 PM   Specimen: Nasopharyngeal Swab  Result Value Ref Range Status   SARS Coronavirus 2 by RT PCR NEGATIVE NEGATIVE Final    Comment: (NOTE) SARS-CoV-2 target nucleic acids are NOT DETECTED. The SARS-CoV-2 RNA is generally detectable in upper respiratoy specimens during the acute phase of infection. The lowest concentration of SARS-CoV-2 viral copies this assay can detect is 131 copies/mL. A negative result does not preclude SARS-Cov-2 infection and should not be used as the sole basis for treatment or other patient management decisions. A negative result may occur with  improper specimen collection/handling, submission of specimen other than nasopharyngeal swab, presence of viral mutation(s) within the areas targeted by this assay, and inadequate number of viral copies (<131 copies/mL). A negative result must be combined with clinical observations, patient history, and epidemiological information. The expected result is Negative. Fact Sheet for Patients:  PinkCheek.be Fact Sheet for Healthcare Providers:  GravelBags.it This test is not yet ap proved or cleared by the [place] FDA and  has been authorized for detection and/or diagnosis of SARS-CoV-2 by FDA under an Emergency Use Authorization (EUA). This EUA will remain   in effect (meaning this test can be used) for the duration of the COVID-19 declaration under Section 564(b)(1) of the Act, 21 U.S.C. section 360bbb-3(b)(1), unless the authorization is terminated or revoked sooner.    Influenza A by PCR NEGATIVE NEGATIVE Final   Influenza B by PCR NEGATIVE NEGATIVE Final    Comment: (NOTE) The Xpert Xpress SARS-CoV-2/FLU/RSV assay is intended as an aid in  the diagnosis of influenza from Nasopharyngeal swab specimens and  should not be used as a sole basis for treatment. Nasal washings and  aspirates are unacceptable for Xpert Xpress SARS-CoV-2/FLU/RSV  testing. Fact Sheet for Patients: PinkCheek.be Fact Sheet for Healthcare  Providers: GravelBags.it This test is not yet approved or cleared by the [place] and  has been authorized for detection and/or diagnosis of SARS-CoV-2 by  FDA under an Emergency Use Authorization (EUA). This EUA will remain  in effect (meaning this test can be used) for the duration of the  Covid-19 declaration under Section 564(b)(1) of the Act, 21  U.S.C. section 360bbb-3(b)(1), unless the authorization is  terminated or revoked. Performed at [facility], [place] [address] [place], [place] [address]          Radiology Studies: CT Head Wo Contrast  Result Date: 09/14/2019 CLINICAL DATA:  Altered mental status. EXAM: CT HEAD WITHOUT CONTRAST TECHNIQUE: Contiguous axial images were obtained from the base of the skull through the vertex without intravenous contrast. COMPARISON:  September 09, 2019 FINDINGS: Brain: There is mild cerebral atrophy with widening of the extra-axial spaces and ventricular dilatation. There are areas of decreased attenuation within the white matter tracts of the supratentorial brain, consistent with microvascular disease changes. Small chronic bilateral basal ganglia lacunar infarcts are seen. Vascular: No hyperdense vessel or  unexpected calcification. Skull: Normal. Negative for fracture or focal lesion. Sinuses/Orbits: No acute finding. Other: None. IMPRESSION: 1. Generalized cerebral atrophy. 2. No acute intracranial abnormality. Electronically Signed   By: Virgina Norfolk M.D.   On: 09/14/2019 19:23   DG Chest Port 1 View  Result Date: 09/14/2019 CLINICAL DATA:  Altered mental status. The fell and hit head on 04/24, followed by a normal head CT. EXAM: PORTABLE CHEST 1 VIEW COMPARISON:  08/31/2019 FINDINGS: Patient has LEFT-sided transvenous pacemaker with leads to the RIGHT atrium and RIGHT ventricle. The heart is enlarged. There is increased opacity at the LEFT lung base, consistent with atelectasis or infiltrate. Suspect LEFT pleural effusion. The increased prominence of interstitial markings raises a question of interstitial edema. IMPRESSION: 1. Cardiomegaly and possible interstitial edema. 2. Increased LEFT lower lobe atelectasis or infiltrate. 3. Probable LEFT pleural effusion. Electronically Signed   By: Nolon Nations M.D.   On: 09/14/2019 19:18        Scheduled Meds: . amiodarone  200 mg Oral Daily  . heparin  5,000 Units Subcutaneous Q8H  . isosorbide mononitrate  60 mg Oral Daily  . pravastatin  40 mg Oral QHS   Continuous Infusions: . piperacillin-tazobactam (ZOSYN)  IV Stopped (09/15/19 0804)     LOS: 1 day    Time spent: 35 minutes    Edwin Dada, MD [facility] 09/15/2019, 8:23 AM     Please page though [facility] or Epic secure chat:  For [employer], Adult nurse

## 2019-09-15 NOTE — Consult Note (Signed)
[facility] Renal Consultation Note    Indication for Consultation:  Management of ESRD/hemodialysis, anemia, hypertension/volume, and secondary hyperparathyroidism.   HPI: Valerie Terrell is a 86 y.o. female with a PMH including ESRD on dialysis, PAF, CVA, T12 compression fracture, NICM with pacemaker, and HTN who was recently admitted to [facility] for diverticulitis and compression fracture. She was discharged on 4/23. SNF was recommended but ultimately declined. She then had an ED visit on 4/25 after a fall.  Paughter noted patient had weakness and confusion since discharge. Weakness progressed over the past several days until patient was no longer able to stand and did not recognize her daughter. Also reports diarrhea for several days and poor PO intake. On 4/29, PT noted O2 sats in the 80's. She went to dialysis however remained hypoxic and confused and returned to the ED for evaluation. Patient's daughter reports she feels her mother needs a higher level of care than she is able to provider at home.   On presentation to the ED, pt with mild hypertension, hypoxia requiring 3L O2, WBC 15.4, Hgb 10.3, CMP with low Alb and Cr 3.11 but otherwise unremarkable. COVID test negative. CXR revealed cardiomegaly with possible interstitial edema, LLE atelectasis vs infiltrate, and probable L pleural effusion. CT head with no acute abnormality but general cerebral atrophy. Patient admitted for PNA and started on broad spectrum antibiotics. Of note, patient does typically require dialysis 4 days per week for volume management but is under her dry weight of 51kg.   Patient reports she has an ongoing cough and does feel weak. She denies recent SOB, orthopnea, PND, CP or dizziness. Reports several days of diarrhea but denies nausea, vomiting, or blood in stool. Her daughter reports some peripheral edema but actually better than usual. Patient does endorse poor PO intake recently but feels it is  improving.   Past Medical History:  Diagnosis Date  . Anemia of chronic disease 2018   + anemia of CRI?  [name] Atrophy of left kidney    with absent blood flow by renal artery dopplers (Dr. Gwenlyn Found)  . 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 Chronic combined systolic and diastolic CHF (congestive heart failure) ([place])    a. 11/2016: echo showing EF of 35-40%, RV strain noted, mild MR and mild TR.   Echo showed much improved EF 12/2017 (55-60%)  . Chronic respiratory failure with hypoxia ([place]) 02/11/2017   Now on chronic O2 (intermittent as of summer 2019).    . Closed fracture of right superior pubic ramus ([employer]) 07/18/2018   Non op mgmt (Dr. Victorino December was ortho who saw her in hosp but i don't think pt followed up with him).  . Closed right hip fracture ([place]) 10/04/2018   ORIF  . Debilitated patient    WC dependent as of 2018.  Hosp bed + full assistance with most ADLs s/p hip fx surg 09/2018.  [name] ESRD on hemodialysis ([place]) 01/2017   T/Th/Sat schedule- [place].  Right basilic AV fistula [id number]/9563.  Graft 2019.  [name] History of adenomatous polyp of colon   . History of kidney stones   . History of subarachnoid hemorrhage 10/2014   after syncope and while on xarelto  . Hyperlipidemia   . Hypertension    Difficult to control, in the setting of one functioning kidney: pt was referred to nephrology by Dr. Gwenlyn Found 06/2015.  . Lumbar radiculopathy 2012  . Lumbar spinal stenosis 2019   facet injections only very  short term relief.  L1 and L2 selective nerve root blocks helpful 04/2018.  . Lumbar spondylosis    MR 07/2016---no sign of spinal nerve compression or cord compression.  Pt set up with outpt ortho while admitted to hosp 07/2016.  . Malnourished ([place])   . Metatarsal fracture 06/10/2016   Nondisplaced, left 5th metatarsal--pt was referred to ortho  . Osteopenia 2014   T-score -2.1  . PAF (paroxysmal atrial fibrillation) (H[facility])    Eliquis started after BRAO and CVA.  She was  changed to warfarin 2018. All anticoag stopped due to recurrent falls.  . Presence of permanent cardiac pacemaker   . Right rib fracture 12/2016   s/p fall  . Sick sinus syndrome ([place]) 11/2016   Dual chamber pacer insertion 2018 (Dr. Lovena Le)  . Stroke ([place]) 76/7209   cardioembolic (had CVA while on no anticoag)--"scattered subacute punctate infarcts: 1 in R parietal lobe and 2 in occipital cortex" on MRI br.  CT angio head/neck: aortic arch athero.  R ICA 20% stenosis, L ICA w/out any stenosis.  . Thoracic back pain 01/2017; 01/2018   2018: Facet?  Dr. Ramos->steroid injection.  01/2018-->CT T spine to check for a new comp fx-->none found.  Selective nerve root inj in T spine helpful on R side.   . Thoracic compression fracture ([place]) 07/2016   T3.  T7 and T8-- T8 kyphoplasty during hosp admission 07/2016.  Neuro referred pt to pain mgmt for consideration of injection 09/2016.  I referred her to endo 08/2016 for consideration of calcitonin treatment.   Past Surgical History:  Procedure Laterality Date  . APPENDECTOMY  child  . AV FISTULA PLACEMENT Right 03/31/2017   Procedure: Right ARM Basilic vein transposition;  Surgeon: Conrad Kaycee, MD;  Location: [facility] OR;  Service: Vascular;  Laterality: Right;  . AV FISTULA PLACEMENT Right 12/06/2017   Procedure: INSERTION OF ARTERIOVENOUS (AV) GORE-TEX GRAFT ARM RIGHT UPPER EXTREMITY;  Surgeon: [name], MD;  Location: [facility];  Service: Vascular;  Laterality: Right;  . CARDIOVASCULAR STRESS TEST  01/31/2016   Stress myoview: NORMAL/Low risk.  EF 56%.  . [place]  . COLONOSCOPY  2015   + hx of adenomatous polyps.  Need digest health spec in [place] records to see when pt due for next colonoscopy  . DECLOT CKV Right 03/21/2019  . EYE SURGERY Bilateral    Catarct  . INSERTION OF DIALYSIS CATHETER Right 01/29/2017   Procedure: INSERTION OF DIALYSIS CATHETER- RIGHT INTERNAL JUGULAR;  Surgeon: [name], MD;  Location: [facility];  Service:  Vascular;  Laterality: Right;  . INTRAMEDULLARY (IM) NAIL INTERTROCHANTERIC Right 10/04/2018   Procedure: INTRAMEDULLARY (IM) NAIL INTERTROCHANTRIC;  Surgeon: Nicholes Stairs, MD;  Location: [facility];  Service: Orthopedics;  Laterality: Right;  . IR GENERIC HISTORICAL  07/24/2016   IR KYPHO THORACIC WITH BONE BIOPSY 07/24/2016 Luanne Bras, MD [facility]-INTERV RAD  . IR KYPHO THORACIC WITH BONE BIOPSY  09/06/2019  . KYPHOPLASTY  07/27/2016   T8  . PACEMAKER IMPLANT N/A 12/03/2016   Procedure: Pacemaker Implant;  Surgeon: Evans Lance, MD;  Location: [facility];  Service: Cardiovascular;  Laterality: N/A;  . Renal artery dopplers  02/26/2016   Her right renal dimension was 11 cm pole to pole with mild to moderate right renal artery stenosis. A right renal aortic ratio was 3.22 suggesting less than a 50% stenosis.  . Right upper arm AV Gore-Tex graft  12/06/2017   Dr. Donnetta Hutching  .  TONSILLECTOMY    . TRANSTHORACIC ECHOCARDIOGRAM  01/29/2016; 11/2016; 12/2017   2017: EF 55-60%, normal LV wall motion, grade I DD.  No cardiac source of emboli was seen.  2018: EF 35-40%, could not assess DD due to a-fib, pulm HTN noted. 12/2017 EF 55-60%, nl wall motion, grd II DD, mod MR and pulm regurg, increased pulm pressure (peak 52).   Family History  Problem Relation Age of Onset  . Cancer Mother   . Heart disease Father   . Early death Father   . Sudden Cardiac Death Neg Hx    Social History:  reports that she quit smoking about 47 years ago. She quit after 22.00 years of use. She has never used smokeless tobacco. She reports current alcohol use of about 1.0 standard drinks of alcohol per week. She reports that she does not use drugs.  ROS: As per HPI otherwise negative.  Physical Exam: Vitals:   09/15/19 0701 09/15/19 0830 09/15/19 0930 09/15/19 0955  BP: (!) 154/63 (!) 133/59 (!) 118/52 (!) 127/50  Pulse: 79   64  Resp: 16   19  Temp:      TempSrc:      SpO2: 100%   96%  Weight:      Height:          General: Well developed, chronically ill appearing female, in no acute distress. Head: Normocephalic, bruising noted on face, sclera non-icteric, mucus membranes are moist. Neck:  JVD not elevated. Lungs: Clear bilaterally to auscultation without wheezes, rales, or rhonchi. Breathing is unlabored on O2 nasal cannula. Heart: RRR with normal S1, S2. No murmurs, rubs, or gallops appreciated. Abdomen: Soft, non-tender, non-distended with normoactive bowel sounds. No rebound/guarding. No obvious abdominal masses. Musculoskeletal:  Strength and tone appear normal for age. Lower extremities: 1+ edema bilateral lower extremities. Neuro: Alert and and responds to questions appropriately, fell asleep toward end of exam but rouses to verbal stimuli Dialysis Access: RUE AVG + thrill/bruit, scab next to AVG but no active bleeding  Allergies  Allergen Reactions  . Clonidine Derivatives Palpitations and Other (See Comments)    VERY SEDATED  . Adhesive [Tape] Other (See Comments)    Tears skin up  . Codeine Nausea Only  . Nickel Rash  . Sulfa Antibiotics Other (See Comments)    Reaction unknown Did not feel well when taken Reaction unknown   Prior to Admission medications   Medication Sig Start Date End Date Taking? Authorizing Provider  acetaminophen (TYLENOL) 500 MG tablet Take 500-1,000 mg 3 (three) times daily as needed by mouth for moderate pain.    Yes [provider]  Amino Acids-Protein Hydrolys (FEEDING SUPPLEMENT, PRO-STAT SUGAR FREE 64,) LIQD Take 30 mLs by mouth 2 (two) times daily. 09/08/19  Yes Pokhrel, Laxman, MD  amiodarone (PACERONE) 200 MG tablet TAKE 1 TABLET BY MOUTH  DAILY Patient taking differently: Take 200 mg by mouth daily.  03/27/19  Yes Lorretta Harp, MD  Artificial Saliva (ACT DRY MOUTH) LOZG Use as directed 1 lozenge in the mouth or throat daily as needed (for dry mouth).   Yes [provider]  aspirin EC 325 MG tablet Take 325 mg by mouth  every evening.   Yes [provider]  b complex-vitamin c-folic acid (NEPHRO-VITE) 0.8 MG TABS tablet Take 1 tablet by mouth See admin instructions. Take one tablet by mouth on Tuesday, Thursday, Saturday - after dialysis 09/07/18  Yes [provider]  calcitonin, salmon, (MIACALCIN/FORTICAL) 200 UNIT/ACT nasal spray  Place 1 spray into alternate nostrils daily.  07/06/19  Yes [provider]  D-5000 125 MCG (5000 UT) TABS Take 1 tablet by mouth daily. 08/18/19  Yes [provider]  DENTA 5000 PLUS 1.1 % CREA dental cream Place 1 application onto teeth at bedtime.  04/06/19  Yes [provider]  docusate sodium (COLACE) 100 MG capsule Take 100 mg by mouth daily as needed for mild constipation.    Yes [provider]  doxercalciferol (HECTOROL) 2.5 MCG capsule Take 2.5 mcg by mouth 3 (three) times a week.  02/28/19 02/27/20 Yes [provider]  heparin 1000 unit/mL SOLN injection 1,000 Units by Dialysis route one time in dialysis.  02/07/19 02/06/20 Yes [provider]  hydrocortisone cream 1 % Apply topically 4 (four) times daily. 09/08/19  Yes Pokhrel, Corrie Mckusick, MD  isosorbide mononitrate (IMDUR) 60 MG 24 hr tablet TAKE 1 TABLET BY MOUTH  DAILY ; TAKE 1 TABLET ON  SUN, MON, WED, FRI MORNINGS AND 1 TABLET ON TUES, THU,  SAT AFTER DIALYSIS Patient taking differently: Take 60 mg by mouth daily.  07/21/19  Yes McGowen, Adrian Blackwater, MD  Lidocaine 4 % PTCH Apply 1 patch topically daily as needed (Mild pain).   Yes [provider]  lidocaine-prilocaine (EMLA) cream Apply 1 application topically See admin instructions. Apply small amount to access site (AVF) one hour before dialysis, cover with occlusive dressing (saran wrap) 11/11/18  Yes [provider]  Multiple Vitamins-Minerals (MULTIVITAMIN WITH MINERALS) tablet Take 1 tablet by mouth at bedtime.    Yes [provider]  ondansetron (ZOFRAN) 4 MG tablet Take 1 tablet (4 mg  total) by mouth every 8 (eight) hours as needed for nausea or vomiting. 07/12/19  Yes McGowen, Adrian Blackwater, MD  Oxycodone HCl 10 MG TABS Take 2 tablets (20 mg total) by mouth every 6 (six) hours as needed (for pain). 09/08/19  Yes Pokhrel, Laxman, MD  polyethylene glycol (MIRALAX / GLYCOLAX) 17 g packet Take 17 g by mouth daily as needed for mild constipation. 09/08/19  Yes Pokhrel, Laxman, MD  polyvinyl alcohol (LIQUIFILM TEARS) 1.4 % ophthalmic solution Place 1-2 drops into both eyes daily as needed for dry eyes.    Yes [provider]  pravastatin (PRAVACHOL) 40 MG tablet TAKE 1 TABLET BY MOUTH  EVERY EVENING Patient taking differently: Take 40 mg by mouth at bedtime.  03/09/18  Yes McGowen, Adrian Blackwater, MD  sevelamer carbonate (RENVELA) 800 MG tablet Take 1,600 mg by mouth 3 (three) times daily with meals.    Yes [provider]  vitamin C (ASCORBIC ACID) 500 MG tablet Take 500 mg by mouth daily.   Yes [provider]  Vitamin D, Ergocalciferol, (DRISDOL) 1.25 MG (50000 UT) CAPS capsule Take 1 capsule (50,000 Units total) by mouth every Thursday. 04/28/18  Yes McGowen, Adrian Blackwater, MD  melatonin 3 MG TABS tablet Take 2 tablets (6 mg total) by mouth at bedtime as needed (sleep). Patient not taking: Reported on 09/14/2019 09/08/19   Flora Lipps, MD  naloxone [facility]) 0.4 MG/ML injection Inject IM as needed for reversal of opioid pain medication Patient not taking: Reported on 08/31/2019 07/20/19   Tammi Sou, MD   Current Facility-Administered Medications  Medication Dose Route Frequency Provider Last Rate Last Admin  . amiodarone (PACERONE) tablet 200 mg  200 mg Oral Daily Fair, Chelsea N, MD      . cefTRIAXone (ROCEPHIN) 2 g in sodium chloride 0.9 % 100 mL IVPB  2 g Intravenous Q24H Danford, Christopher P, MD      . heparin injection 5,000 Units  5,000 Units Subcutaneous Q8H Fair, Chelsea N, MD      . isosorbide mononitrate (IMDUR) 24 hr tablet 60 mg  60 mg Oral Daily  Fair, Chelsea N, MD      . pravastatin (PRAVACHOL) tablet 40 mg  40 mg Oral QHS Fair, Marin Shutter, MD       Current Outpatient Medications  Medication Sig Dispense Refill  . acetaminophen (TYLENOL) 500 MG tablet Take 500-1,000 mg 3 (three) times daily as needed by mouth for moderate pain.     . Amino Acids-Protein Hydrolys (FEEDING SUPPLEMENT, PRO-STAT SUGAR FREE 64,) LIQD Take 30 mLs by mouth 2 (two) times daily.  0  . amiodarone (PACERONE) 200 MG tablet TAKE 1 TABLET BY MOUTH  DAILY (Patient taking differently: Take 200 mg by mouth daily. ) 90 tablet 1  . Artificial Saliva (ACT DRY MOUTH) LOZG Use as directed 1 lozenge in the mouth or throat daily as needed (for dry mouth).    [name] aspirin EC 325 MG tablet Take 325 mg by mouth every evening.    [name] b complex-vitamin c-folic acid (NEPHRO-VITE) 0.8 MG TABS tablet Take 1 tablet by mouth See admin instructions. Take one tablet by mouth on Tuesday, Thursday, Saturday - after dialysis    . calcitonin, salmon, (MIACALCIN/FORTICAL) 200 UNIT/ACT nasal spray Place 1 spray into alternate nostrils daily.     . D-5000 125 MCG (5000 UT) TABS Take 1 tablet by mouth daily.    . DENTA 5000 PLUS 1.1 % CREA dental cream Place 1 application onto teeth at bedtime.     . docusate sodium (COLACE) 100 MG capsule Take 100 mg by mouth daily as needed for mild constipation.     [name] doxercalciferol (HECTOROL) 2.5 MCG capsule Take 2.5 mcg by mouth 3 (three) times a week.     . heparin 1000 unit/mL SOLN injection 1,000 Units by Dialysis route one time in dialysis.     . hydrocortisone cream 1 % Apply topically 4 (four) times daily. 30 g 0  . isosorbide mononitrate (IMDUR) 60 MG 24 hr tablet TAKE 1 TABLET BY MOUTH  DAILY ; TAKE 1 TABLET ON  SUN, MON, WED, FRI MORNINGS AND 1 TABLET ON TUES, THU,  SAT AFTER DIALYSIS (Patient taking differently: Take 60 mg by mouth daily. ) 90 tablet 3  . Lidocaine 4 % PTCH Apply 1 patch topically daily as needed (Mild pain).    [name] lidocaine-prilocaine  (EMLA) cream Apply 1 application topically See admin instructions. Apply small amount to access site (AVF) one hour before dialysis, cover with occlusive dressing (saran wrap)    . Multiple Vitamins-Minerals (MULTIVITAMIN WITH MINERALS) tablet Take 1 tablet by mouth at bedtime.     . ondansetron (ZOFRAN) 4 MG tablet Take 1 tablet (4 mg total) by mouth every 8 (eight) hours as needed for nausea or vomiting. 60 tablet 1  . Oxycodone HCl 10 MG TABS Take 2 tablets (20 mg total) by mouth every 6 (six) hours as needed (for pain). 20 tablet 0  . polyethylene glycol (MIRALAX / GLYCOLAX) 17 g packet Take 17 g by mouth daily as needed for mild constipation.    . polyvinyl alcohol (LIQUIFILM TEARS) 1.4 % ophthalmic solution Place 1-2 drops into both eyes daily as needed for dry eyes.     . pravastatin (PRAVACHOL) 40 MG tablet TAKE 1 TABLET BY MOUTH  EVERY EVENING (  Patient taking differently: Take 40 mg by mouth at bedtime. ) 90 tablet 1  . sevelamer carbonate (RENVELA) 800 MG tablet Take 1,600 mg by mouth 3 (three) times daily with meals.     . vitamin C (ASCORBIC ACID) 500 MG tablet Take 500 mg by mouth daily.    . Vitamin D, Ergocalciferol, (DRISDOL) 1.25 MG (50000 UT) CAPS capsule Take 1 capsule (50,000 Units total) by mouth every Thursday. 12 capsule 3  . melatonin 3 MG TABS tablet Take 2 tablets (6 mg total) by mouth at bedtime as needed (sleep). (Patient not taking: Reported on 09/14/2019)  0  . naloxone (NARCAN) 0.4 MG/ML injection Inject IM as needed for reversal of opioid pain medication (Patient not taking: Reported on 08/31/2019) 1 mL 1   Labs: Basic Metabolic Panel: Recent Labs  Lab 09/09/19 0832 09/14/19 1624  NA 135 140  K 4.0 3.7  CL 98 97*  CO2 24 28  GLUCOSE 88 92  BUN 19 11  CREATININE 3.78* 3.11*  CALCIUM 8.7* 7.9*  PHOS 3.5  --    Liver Function Tests: Recent Labs  Lab 09/09/19 0832 09/14/19 1624  AST  --  21  ALT  --  6  ALKPHOS  --  104  BILITOT  --  0.5  PROT  --  6.1*   ALBUMIN 2.0* 2.3*   No results for input(s): LIPASE, AMYLASE in the last 168 hours. No results for input(s): AMMONIA in the last 168 hours. CBC: Recent Labs  Lab 09/09/19 0832 09/14/19 1624 09/15/19 0959  WBC 8.2 15.4* 10.9*  HGB 10.7* 11.8* 10.3*  HCT 34.1* 37.5 33.2*  MCV 99.1 98.7 98.2  PLT 259 259 222   Cardiac Enzymes: No results for input(s): CKTOTAL, CKMB, CKMBINDEX, TROPONINI in the last 168 hours. CBG: Recent Labs  Lab 09/14/19 2121  GLUCAP 88   Iron Studies: No results for input(s): IRON, TIBC, TRANSFERRIN, FERRITIN in the last 72 hours. Studies/Results: CT Head Wo Contrast  Result Date: 09/14/2019 CLINICAL DATA:  Altered mental status. EXAM: CT HEAD WITHOUT CONTRAST TECHNIQUE: Contiguous axial images were obtained from the base of the skull through the vertex without intravenous contrast. COMPARISON:  September 09, 2019 FINDINGS: Brain: There is mild cerebral atrophy with widening of the extra-axial spaces and ventricular dilatation. There are areas of decreased attenuation within the white matter tracts of the supratentorial brain, consistent with microvascular disease changes. Small chronic bilateral basal ganglia lacunar infarcts are seen. Vascular: No hyperdense vessel or unexpected calcification. Skull: Normal. Negative for fracture or focal lesion. Sinuses/Orbits: No acute finding. Other: None. IMPRESSION: 1. Generalized cerebral atrophy. 2. No acute intracranial abnormality. Electronically Signed   By: Virgina Norfolk M.D.   On: 09/14/2019 19:23   DG Chest Port 1 View  Result Date: 09/14/2019 CLINICAL DATA:  Altered mental status. The fell and hit head on 04/24, followed by a normal head CT. EXAM: PORTABLE CHEST 1 VIEW COMPARISON:  08/31/2019 FINDINGS: Patient has LEFT-sided transvenous pacemaker with leads to the RIGHT atrium and RIGHT ventricle. The heart is enlarged. There is increased opacity at the LEFT lung base, consistent with atelectasis or infiltrate.  Suspect LEFT pleural effusion. The increased prominence of interstitial markings raises a question of interstitial edema. IMPRESSION: 1. Cardiomegaly and possible interstitial edema. 2. Increased LEFT lower lobe atelectasis or infiltrate. 3. Probable LEFT pleural effusion. Electronically Signed   By: Nolon Nations M.D.   On: 09/14/2019 19:18    Dialysis Orders: Center: [facility]  on  MTTS.  4hr, 350/600, EDW 51kg, 2K/2Ca, AVG, -heparin 1400units IVbolus - NoESA  Assessment/Plan: 1.  HCAP/AMS: CXR revealing L lower lobe atelectasis vs infiltrate with pleural effusion. WBC elevated on admission. Started on broad spectrum antibiotics, blood cultures pending. Per primary. 2.  ESRD:  Dialyzes on MWF schedule. No urgent indication for HD today, will plan to dialyze tomorrow per regular MTTS schedule. 3.  Hypertension/volume: Trace edema but improved compared to her outpatient baseline. Under EDW, likely due to diarrhea and poor PO intake. BP controlled. UF tomorrow with HD as tolerated.  4.  Anemia: Hgb 10.3, no indication for ESA at present. Will continue to follow.  5.  Metabolic bone disease: Corrected calcium 9.2, phos at goal. Continue outpatient binder.  6. Diarrhea: Recent sigmoid diverticulitis, treated with antibiotics. Per primary.  7. Hx T11 compression fracture: No complaint of pain at present, narcotics on hold 8. Debility: SNF recommended at last discharge but pt ultimately went home with her daughter, then fell shortly after returning home. Agree with palliative care consult to discuss goals of care. Patient is currently a DNR.   Anice Paganini, PA-C 09/15/2019, 10:42 AM  [facility] Pager: [phone]

## 2019-09-15 NOTE — Evaluation (Signed)
Physical Therapy Evaluation Patient Details Name: Valerie Terrell MRN: [id number] DOB: 08/15/1932 Today's Date: 09/15/2019   History of Present Illness  Valerie Terrell is a 86 y.o. F with hx CHF, chronic hypoxic respiratory failure on 2L, ESRD on HD TThS, HTN, pAF no longer on AC, NICM and SSS with pacer and hx stroke who presented from HD with hypoxia, decreased sensorium, confusion and generalized weakness for 1-2 days.  Found to have HCAP.  Clinical Impression  Pt admitted with/for s/s consistent with HCAP.  Pt is significantly weak at this time needing mod to max assist overall for basic mobility/transfers..  Pt currently limited functionally due to the problems listed. ( See problems list.)   Pt will benefit from PT to maximize function and safety in order to get ready for next venue listed below.     Follow Up Recommendations SNF;Supervision/Assistance - 24 hour    Equipment Recommendations       Recommendations for Other Services       Precautions / Restrictions Precautions Precautions: Fall      Mobility  Bed Mobility Overal bed mobility: Needs Assistance Bed Mobility: Supine to Sit;Sit to Supine     Supine to sit: Max assist Sit to supine: Max assist   General bed mobility comments: requires assist for all aspects   Transfers Overall transfer level: Needs assistance Equipment used: 1 person hand held assist Transfers: Sit to/from [employer] Transfers Sit to Stand: Max assist;+2 safety/equipment Stand pivot transfers: Max assist;+2 safety/equipment       General transfer comment: Pt demonstrates posterior bias.  She requires assist to move into standing and for balance   Ambulation/Gait             General Gait Details: Not tested today due to safe risk  Stairs            Wheelchair Mobility    Modified Rankin (Stroke Patients Only)       Balance Overall balance assessment: Needs assistance Sitting-balance support: Feet  supported;Single extremity supported Sitting balance-Leahy Scale: Poor Sitting balance - Comments: Pt requires very close min guard assist to min A to maintain static sitting in straight back chair.  Pt tends to slide hips forward placing her at risk for falls    Standing balance support: Bilateral upper extremity supported Standing balance-Leahy Scale: Poor Standing balance comment: requires max A                              Pertinent Vitals/Pain Pain Assessment: No/denies pain    Home Living Family/patient expects to be discharged to:: Skilled nursing facility                 Additional Comments: Pt discharged home with daughter after previous admission at the end of April.  Daughter reports she is unable to provide the necessary level of assist that pt currently requires     Prior Function Level of Independence: Needs assistance   Gait / Transfers Assistance Needed: Pt has been unable to ambulate since discharge from hospital at end of April.  Daughter has been providing max A for transfers   ADL's / Homemaking Assistance Needed: Pt has been requiring assist with all ADLs since last admission in April         Hand Dominance   Dominant Hand: Right    Extremity/Trunk Assessment   Upper Extremity Assessment Upper Extremity Assessment: Generalized weakness    Lower  Extremity Assessment Lower Extremity Assessment: Generalized weakness    Cervical / Trunk Assessment Cervical / Trunk Assessment: Kyphotic  Communication   Communication: HOH  Cognition Arousal/Alertness: Awake/alert Behavior During Therapy: WFL for tasks assessed/performed Overall Cognitive Status: Impaired/Different from baseline Area of Impairment: Orientation;Attention;Memory;Safety/judgement;Awareness;Problem solving                 Orientation Level: Disoriented to;Time Current Attention Level: Sustained Memory: Decreased short-term memory Following Commands: Follows one  step commands consistently;Follows one step commands with increased time     Problem Solving: Slow processing;Decreased initiation;Difficulty sequencing;Requires verbal cues;Requires tactile cues        General Comments General comments (skin integrity, edema, etc.): Daugter present during the session    Exercises Other Exercises Other Exercises: warm up ROM prior to mobility   Assessment/Plan    PT Assessment Patient needs continued PT services  PT Problem List Decreased strength;Decreased activity tolerance;Decreased balance;Decreased mobility;Decreased coordination;Cardiopulmonary status limiting activity       PT Treatment Interventions DME instruction;Gait training;Functional mobility training;Therapeutic activities;Therapeutic exercise;Balance training;Patient/family education    PT Goals (Current goals can be found in the Care Plan section)  Acute Rehab PT Goals Patient Stated Goal: for pt to get stronger and be able to do more for self  PT Goal Formulation: With patient Time For Goal Achievement: 09/29/19 Potential to Achieve Goals: Fair    Frequency Min 3X/week   Barriers to discharge        Co-evaluation PT/OT/SLP Co-Evaluation/Treatment: Yes Reason for Co-Treatment: Complexity of the patient's impairments (multi-system involvement);Necessary to address cognition/behavior during functional activity;For patient/therapist safety;To address functional/ADL transfers PT goals addressed during session: Mobility/safety with mobility OT goals addressed during session: ADL's and self-care       AM-PAC PT "6 Clicks" Mobility  Outcome Measure Help needed turning from your back to your side while in a flat bed without using bedrails?: A Lot Help needed moving from lying on your back to sitting on the side of a flat bed without using bedrails?: A Lot Help needed moving to and from a bed to a chair (including a wheelchair)?: Total Help needed standing up from a chair  using your arms (e.g., wheelchair or bedside chair)?: A Lot Help needed to walk in hospital room?: A Lot Help needed climbing 3-5 steps with a railing? : Total 6 Click Score: 10    End of Session   Activity Tolerance: Patient tolerated treatment well;Patient limited by fatigue Patient left: in bed;with call bell/phone within reach;with family/visitor present Nurse Communication: Mobility status PT Visit Diagnosis: Unsteadiness on feet (R26.81);Muscle weakness (generalized) (M62.81);Difficulty in walking, not elsewhere classified (R26.2)    Time: 7824-2353 PT Time Calculation (min) (ACUTE ONLY): 26 min   Charges:   PT Evaluation $PT Eval Moderate Complexity: 1 Mod        }09/15/2019  Ginger Carne., PT Acute Rehabilitation Services [phone]  (pager) [phone]  (office)09/15/2019    Tessie Fass Karlee Staff 09/15/2019, 11:54 AM

## 2019-09-15 NOTE — Progress Notes (Signed)
Initial Nutrition Assessment  DOCUMENTATION CODES:   Non-severe (moderate) malnutrition in context of chronic illness  INTERVENTION:   - Ensure Enlive po TID, each supplement provides 350 kcal and 20 grams of protein  - Agree with Regular diet order  - renal MVI daily  NUTRITION DIAGNOSIS:   Moderate Malnutrition related to chronic illness (ESRD on HD) as evidenced by moderate fat depletion, moderate muscle depletion.  GOAL:   Patient will meet greater than or equal to 90% of their needs  MONITOR:   PO intake, Supplement acceptance, Labs, Weight trends, Skin, I & O's  REASON FOR ASSESSMENT:   Consult Assessment of nutrition requirement/status  ASSESSMENT:   86 year old female who presented on 4/29 with weakness, AMS. PMH of PAF, CVA, ESRD on HD, T12 compression, NICM with pacemaker, HTN, HLD, recent diverticulitis. Pt admitted with HCAP.   Noted Palliative consult pending.  Spoke with pt at bedside. Pt is a poor historian. Pt reports that her appetite was "picking back up before I came back here." Pt unable to provide information regarding typical PO intake and weight history. When asked if she is losing weight, pt states, "I think so." Pt is unsure of her UBW.  Pt states that she has consumed supplements before and is willing to drink them here. Pt requests strawberry Ensure Enlive. RD provided pt with a supplement and alerted RN.  EDW: 51 kg  Reviewed weight history in chart. Pt with a 7.3 kg weight loss over the last 11 months. This is a 12.7% weight loss which is not significant for timeframe but is concerning.  Medications reviewed and include: IV abx  Labs reviewed. CBG's: 88  NUTRITION - FOCUSED PHYSICAL EXAM:    Most Recent Value  Orbital Region  Moderate depletion  Upper Arm Region  Moderate depletion  Thoracic and Lumbar Region  Moderate depletion  Buccal Region  Moderate depletion  Temple Region  Moderate depletion  Clavicle Bone Region  Severe  depletion  Clavicle and Acromion Bone Region  Severe depletion  Scapular Bone Region  Moderate depletion  Dorsal Hand  Moderate depletion  Patellar Region  Moderate depletion  Anterior Thigh Region  Moderate depletion  Posterior Calf Region  Moderate depletion  Edema (RD Assessment)  None  Hair  Reviewed  Eyes  Reviewed  Mouth  Reviewed  Skin  Reviewed  Nails  Reviewed       Diet Order:   Diet Order            Diet regular Room service appropriate? No; Fluid consistency: Thin  Diet effective now              EDUCATION NEEDS:   Not appropriate for education at this time  Skin:  Skin Assessment: Skin Integrity Issues: Stage I: sacrum Incisions: back  Last BM:  no documented BM  Height:   Ht Readings from Last 1 Encounters:  09/14/19 4\' 11"  (1.499 m)    Weight:   Wt Readings from Last 1 Encounters:  09/14/19 50 kg    BMI:  Body mass index is 22.26 kg/m.  Estimated Nutritional Needs:   Kcal:  1500-1700  Protein:  70-85 grams  Fluid:  UOP + 1000 ml    Gaynell Face, MS, RD, LDN Inpatient Clinical Dietitian Pager: [phone] Weekend/After Hours: [phone]

## 2019-09-16 LAB — COMPREHENSIVE METABOLIC PANEL
ALT: 6 U/L (ref 0–44)
AST: 25 U/L (ref 15–41)
Albumin: 2 g/dL — ABNORMAL LOW (ref 3.5–5.0)
Alkaline Phosphatase: 85 U/L (ref 38–126)
Anion gap: 13 (ref 5–15)
BUN: 22 mg/dL (ref 8–23)
CO2: 27 mmol/L (ref 22–32)
Calcium: 8.3 mg/dL — ABNORMAL LOW (ref 8.9–10.3)
Chloride: 97 mmol/L — ABNORMAL LOW (ref 98–111)
Creatinine, Ser: 4.7 mg/dL — ABNORMAL HIGH (ref 0.44–1.00)
GFR calc Af Amer: 9 mL/min — ABNORMAL LOW (ref >60)
GFR calc non Af Amer: 8 mL/min — ABNORMAL LOW (ref >60)
Glucose, Bld: 71 mg/dL (ref 70–99)
Potassium: 4.6 mmol/L (ref 3.5–5.1)
Sodium: 137 mmol/L (ref 135–145)
Total Bilirubin: 1.4 mg/dL — ABNORMAL HIGH (ref 0.3–1.2)
Total Protein: 5.6 g/dL — ABNORMAL LOW (ref 6.5–8.1)

## 2019-09-16 LAB — CBC
HCT: 32.9 % — ABNORMAL LOW (ref 36.0–46.0)
Hemoglobin: 10.5 g/dL — ABNORMAL LOW (ref 12.0–15.0)
MCH: 30.9 pg (ref 26.0–34.0)
MCHC: 31.9 g/dL (ref 30.0–36.0)
MCV: 96.8 fL (ref 80.0–100.0)
Platelets: 245 10*3/uL (ref 150–400)
RBC: 3.4 MIL/uL — ABNORMAL LOW (ref 3.87–5.11)
RDW: 16.8 % — ABNORMAL HIGH (ref 11.5–15.5)
WBC: 10.7 10*3/uL — ABNORMAL HIGH (ref 4.0–10.5)
nRBC: 0 % (ref 0.0–0.2)

## 2019-09-16 LAB — HEPATITIS B SURFACE ANTIGEN: Hepatitis B Surface Ag: NONREACTIVE

## 2019-09-16 LAB — HEPATITIS B SURFACE ANTIBODY,QUALITATIVE: Hep B S Ab: NONREACTIVE

## 2019-09-16 LAB — HEPATITIS B CORE ANTIBODY, TOTAL: Hep B Core Total Ab: NONREACTIVE

## 2019-09-16 NOTE — Progress Notes (Signed)
   Palliative Medicine Inpatient Follow Up Note   HPI: Per intake H&P --> Valerie Terrell a 86 y.o.femalewith PMH PAF, hx CVA, ESRD, T12 compression, NICM with pacemaker, HTN, HLD and recent diverticulitis who presented to ER for altered mental status and weakness and found to have HCAP.  Palliative care was involved upon [name] last admission to aid in symptom management and ongoing Valerie Terrell conversations.   As of yesterdays conversation patient wanted to continue thinking about her present health state.   Today's Discussion (09/16/2019): Chart reviewed. Patient remained to sleep a majority of the day yesterday.   I met with Valerie Terrell and her daughter, Valerie Terrell at bedside this afternoon.   We discussed in length the two roads she can choose. The first would be a trial of therapies at skilled nursing and continuation of HD. The caveat of this is that she would need to go to a skilled facility as her daughter is unable to provide the care she would need in her home. The other option would be stopping HD and focusing on the quality of time that she has left this would include transitioning to [facility] Terrell place in their residential hospice.  Upon continued discussion patient states that she is interested in continuing dialysis for the time being. She would like to see if she can get stronger at rehabilitation. She shares that she wants to live. We discussed respecting her wishes. I shared my concerns as she if very fragile post hospitalization and now it appears that her body is having trouble tolerating HD, she is more weakened than prior, and  is afflicted with PNA. These concerns appear to be understood.    Valerie Terrell would like to respect her mothers wishes at this point in time per our conversation. She vocalizes that her mother does seem clear about what she wants at this juncture.   Discussed the importance of continued conversation with family and their  medical providers regarding overall plan of  care and treatment options, ensuring decisions are within the context of the patients values and GOCs.  Vital Signs Vitals:   09/15/19 1519 09/15/19 2208  BP: (!) 110/59 (!) 142/51  Pulse: 76 84  Resp: 16 18  Temp: 97.7 F (36.5 C) 98 F (36.7 C)  SpO2: 96% 95%    Intake/Output Summary (Last 24 hours) at 09/16/2019 1898 Last data filed at 09/15/2019 1700 Gross per 24 hour  Intake 0 ml  Output --  Net 0 ml   Last Weight  Most recent update: 09/14/2019  4:16 PM   Weight  50 kg (110 lb 3.7 oz)           SUMMARY OF RECOMMENDATIONS DNAR/DNI  Continue current modalities of care  continue HD  SNF Placement  Chaplain Consult  Time Spent: 45 Greater than 50% of the time was spent in counseling and coordination of care ______________________________________________________________________________________ Valerie Terrell Team Team Cell Phone: [phone] Please utilize secure chat with additional questions, if there is no response within 30 minutes please call the above phone number  Palliative Medicine Team providers are available by phone from 7am to 7pm daily and can be reached through the team cell phone.  Should this patient require assistance outside of these hours, please call the patient's attending physician.

## 2019-09-16 NOTE — TOC Initial Note (Signed)
Transition of Care [facility]) - Initial/Assessment Note    Patient Details  Name: Emonnie Cannady MRN: [id number] Date of Birth: 1932/07/27  Transition of Care [facility]) CM/SW Contact:    Jacquelynn Cree Phone Number: 09/16/2019, 4:04 PM  Clinical Narrative:                 CSW received consult for possible SNF placement at time of discharge. Patient admitted from home with daughter, receives HD T,Thur,Sa at [facility]. Patient currently oriented x1, CSW spoke with patient's daughter [name]. Daughter expressed understanding of PT recommendation and is in agreement. Preference for [facility]. Palliative to follow at SNF. No further questions at this time. CSW will continue to follow.     Expected Discharge Plan: Skilled Nursing Facility Barriers to Discharge: Ship broker, Continued Medical Work up   Patient Goals and CMS Choice   CMS Medicare.gov Compare Post Acute Care list provided to:: Patient Represenative (must comment)([place]) Choice offered to / list presented to : Adult Children  Expected Discharge Plan and Services Expected Discharge Plan: [place] In-house Referral: Clinical Social Work, Hospice / [place] arrangements for the past 2 months: Single Family Home                                      Prior Living Arrangements/Services Living arrangements for the past 2 months: Single Family Home Lives with:: Adult Children Patient language and need for interpreter reviewed:: Yes Do you feel safe going back to the place where you live?: Yes      Need for Family Participation in Patient Care: No (Comment) Care giver support system in place?: No (comment)   Criminal Activity/Legal Involvement Pertinent to Current Situation/Hospitalization: No - Comment as needed  Activities of Daily Living      Permission Sought/Granted Permission sought to share information with : Facility Sport and exercise psychologist, Family  Supports Permission granted to share information with : Yes, Verbal Permission Granted  Share Information with NAME: Ashland Osmer  Permission granted to share info w AGENCY: SNFs  Permission granted to share info w Relationship: Daughter  Permission granted to share info w Contact Information: [phone]  Emotional Assessment   Attitude/Demeanor/Rapport: Unable to Assess Affect (typically observed): Unable to Assess Orientation: : Oriented to Self Alcohol / Substance Use: Not Applicable Psych Involvement: No (comment)  Admission diagnosis:  Confusion [R41.0] Anemia associated with chronic renal failure [N18.9, D63.1] End-stage renal disease on hemodialysis (HCC) [N18.6, Z99.2] HAP (hospital-acquired pneumonia) [J18.9, Y95] Community acquired pneumonia of left lower lobe of lung [J18.9] Patient Active Problem List   Diagnosis Date Noted  . HAP (hospital-acquired pneumonia) 09/14/2019  . Acute encephalopathy 09/14/2019  . Palliative care by specialist   . Goals of care, counseling/discussion   . DNR (do not resuscitate)   . Inadequate pain control   . Compression fracture of T11 vertebra ([place]) 09/01/2019  . Acute diverticulitis 09/01/2019  . Diverticulitis 08/31/2019  . Pressure injury of skin 08/17/2019  . Volume overload 08/16/2019  . Disorder of phosphorus metabolism, unspecified 05/15/2019  . Hypoxemia 03/24/2019  . Anaphylactic shock, unspecified, sequela 02/23/2019  . Heterotopic ossification 12/29/2018  . Pain, unspecified 10/15/2018  . Hip fracture ([place]) 10/04/2018  . Fracture of pubic ramus ([place]) 07/19/2018  . Fracture of sacrum ([place]) 07/19/2018  . Perineal hematoma 07/19/2018  . Fever 07/19/2018  . Acute blood loss  anemia 07/19/2018  . ESRD (end stage renal disease) ([place]) 05/20/2018  . Chronic back pain 05/14/2018  . Chronic pain syndrome 05/14/2018  . Iron deficiency anemia, unspecified 04/25/2018  . Hypoxia 12/14/2017  . Dyspnea 12/14/2017  . Secondary  hyperparathyroidism of renal origin ([place]) 08/16/2017  . Degeneration of thoracic intervertebral disc 07/26/2017  . Moderate protein-calorie malnutrition ([place]) 02/23/2017  . Acute respiratory failure with hypoxia ([place]) 02/13/2017  . Hyperkalemia 02/13/2017  . ESRD on dialysis ([place]) 02/11/2017  . Chronic respiratory failure with hypoxia ([place]) 02/11/2017  . Weakness generalized 02/07/2017  . Poor tolerance for ambulation 02/07/2017  . Anemia in chronic kidney disease 02/04/2017  . Coagulation defect, unspecified ([place]) 02/04/2017  . Fracture of one rib, right side, initial encounter for closed fracture 01/11/2017  . Closed fracture of one rib of right side   . Chronic combined systolic and diastolic heart failure ([place]) 01/05/2017  . Palpitations 12/15/2016  . Hypokalemia 12/14/2016  . Leukocytosis 12/14/2016  . Long term (current) use of anticoagulants [Z79.01] 12/11/2016  . Non-ischemic cardiomyopathy ([place]) 12/07/2016  . Acute combined systolic and diastolic heart failure ([place])  12/07/2016  . Cardiac pacemaker   . Acute on chronic respiratory failure with hypoxia ([place])   . Acute pulmonary edema with congestive heart failure ([place]) 11/30/2016  . Elevated troponin level 11/30/2016  . Anemia 11/30/2016  . Chronic midline thoracic back pain 09/15/2016  . History of CVA (cerebrovascular accident) 09/15/2016  . Hyperlipidemia   . Nausea & vomiting 08/05/2016  . Dehydration 08/05/2016  . Osteoporosis 07/28/2016  . Closed compression fracture of thoracic vertebra ([place]) 07/23/2016  . Thoracic compression fracture ([place]) 07/22/2016  . Flank pain 07/11/2016  . Hypoalbuminemia 07/11/2016  . Renal artery stenosis ([place]) 06/24/2016  . Nondisplaced fracture of fifth metatarsal bone, left foot, initial encounter for closed fracture 06/10/2016  . Rib contusion, left, initial encounter 06/10/2016  . Single kidney 03/13/2016  . Chest pain   . Dyslipidemia   . PAF (paroxysmal atrial fibrillation)  ([place]) 01/28/2016  . Essential hypertension 01/28/2016  . History of cardioembolic stroke 56/43/3295  . Personal history of subarachnoid hemorrhage 01/28/2016  . CKD (chronic kidney disease), stage IV ([place]) 01/28/2016  . Gait disturbance 01/27/2016  . Mitral valve disease 01/08/2014  . Colon polyps 07/10/2013  . Lumbar radiculopathy 02/04/2011   PCP:  Tammi Sou, MD Pharmacy:   [id number] [place], [address] [place] [address] Phone: [phone] Fax: [phone]  [place], [facility] [address] [place] [address] [place] [address] Phone: [phone] Fax: [phone]     Social Determinants of Health ([place]) Interventions    Readmission Risk Interventions Readmission Risk Prevention Plan 10/06/2018  Transportation Screening Complete  Medication Review ([place]) Complete  PCP or Specialist appointment within 3-5 days of discharge Complete  HRI or Home Care Consult Complete  SW Recovery Care/Counseling Consult Complete  Palliative Care Screening Not [place] Complete  Some recent data might be hidden

## 2019-09-16 NOTE — Progress Notes (Signed)
PROGRESS NOTE    Valerie Terrell  XFG:[id number] DOB: 1933-02-08 DOA: 09/14/2019 PCP: Tammi Sou, MD   Brief Narrative:  Valerie Terrell is a 86 y.o. F with hx CHF, chronic hypoxic respiratory failure on 2L, ESRD on HD TThS, HTN, pAF no longer on AC, NICM and SSS with pacer and hx stroke who presented from HD with hypoxia, decreased sensorium, confusion and generalized weakness for 1-2 days.  Patient recently admitted for a week for confusion, discharged 1 week ago.  Diagnosed with acute diverticulitis complicated by R67 compression fracture and delirium.  Got kyphoplasty, was discharged to home, was doing better until a few days ago.  On day of admission, HHPT noted hypoxia, but patient was well enough to go to HD.  There she was hypoxic again, too sleepy on HD to tolerate, and was sent to ER.  In the ER, WBC 15K.  CXR showed new left effusion and infiltrate.  5/1: Patient receiving hemodialysis today. Awaiting palliative discussion with family members. No acute overnight events noted. No concerns or complaints this am.  Assessment & Plan:   Principal Problem:   HAP (hospital-acquired pneumonia) Active Problems:   PAF (paroxysmal atrial fibrillation) ([place])   Essential hypertension   Hyperlipidemia   History of CVA (cerebrovascular accident)   Cardiac pacemaker   ESRD on dialysis ([place])   Acute respiratory failure with hypoxia (HCC)   Hypoxia   Moderate protein-calorie malnutrition (HCC)   Compression fracture of T11 vertebra (HCC)   Acute encephalopathy   Community acquired pneumonia left lower lobe Recent Abx, but mild disease, no history drug resistant infections. -Continue ceftriaxone   ESRD on HD TThS -Consult nephrology for HD  Chronic systolic and diastolic CHF Chronic hypoxic respiratory failure Non-ischemic cardiomyopathy SSS with pacer  Paroxysmal atrial fibrillation Not on AC due to recurrent falls. -Continue  amiodarone  Hypertension Cerebrovascular disease secondary prevention BP normal -Continue Imdur, statin  Stage I Pressure injury sacrum, POA  Failure to thrive As evidenced by BMI 22, chronic renal and cardiac failure, poor PO intake, pressure injury. -Palliative discussion and further evaluation pending today    DVT prophylaxis:Heparin Code Status: DNR Family Communication: None at bedside Disposition Plan: Per Palliative conversation today. May become comfort care.   Consultants:   Nephrology  Palliative Care  Procedures:   None  Antimicrobials:  Anti-infectives (From admission, onward)   Start     Dose/Rate Route Frequency Ordered Stop   09/16/19 1700  vancomycin (VANCOREADY) IVPB 500 mg/100 mL  Status:  Discontinued     500 mg 100 mL/hr over 60 Minutes Intravenous Once per day on Mon Tue Thu Sat 09/15/19 1457 09/15/19 1457   09/15/19 1200  cefTRIAXone (ROCEPHIN) 2 g in sodium chloride 0.9 % 100 mL IVPB     2 g 200 mL/hr over 30 Minutes Intravenous Every 24 hours 09/15/19 1008     09/15/19 0000  piperacillin-tazobactam (ZOSYN) IVPB 4.5 g  Status:  Discontinued     4.5 g 200 mL/hr over 30 Minutes Intravenous Every 6 hours 09/14/19 2209 09/14/19 2215   09/14/19 2300  piperacillin-tazobactam (ZOSYN) IVPB 3.375 g  Status:  Discontinued     3.375 g 12.5 mL/hr over 240 Minutes Intravenous Every 8 hours 09/14/19 2215 09/14/19 2226   09/14/19 2300  piperacillin-tazobactam (ZOSYN) IVPB 2.25 g  Status:  Discontinued     2.25 g 100 mL/hr over 30 Minutes Intravenous Every 8 hours 09/14/19 2226 09/15/19 1008   09/14/19 2230  vancomycin (VANCOCIN) IVPB  1000 mg/200 mL premix     1,000 mg 200 mL/hr over 60 Minutes Intravenous  Once 09/14/19 2225 09/15/19 0033   09/14/19 2100  cefTRIAXone (ROCEPHIN) 2 g in sodium chloride 0.9 % 100 mL IVPB     2 g 200 mL/hr over 30 Minutes Intravenous  Once 09/14/19 2052 09/14/19 2154   09/14/19 2100  azithromycin (ZITHROMAX) 500 mg in  sodium chloride 0.9 % 250 mL IVPB  Status:  Discontinued     500 mg 250 mL/hr over 60 Minutes Intravenous  Once 09/14/19 2052 09/14/19 2209       Subjective: Patient seen and evaluated today with no new acute complaints or concerns. No acute concerns or events noted overnight. Undergoing HD today.  Objective: Vitals:   09/16/19 1000 09/16/19 1030 09/16/19 1100 09/16/19 1130  BP: (!) 127/45 (!) 106/55 (!) 111/46 (!) 112/34  Pulse: 71 78 71 71  Resp:      Temp:      TempSrc:      SpO2:      Weight:      Height:        Intake/Output Summary (Last 24 hours) at 09/16/2019 1329 Last data filed at 09/16/2019 1200 Gross per 24 hour  Intake 0 ml  Output 1345 ml  Net -1345 ml   Filed Weights   09/14/19 1616 09/16/19 0748  Weight: 50 kg 53.2 kg    Examination:  General exam: Appears calm and comfortable  Respiratory system: Clear to auscultation. Respiratory effort normal. Currently on 3L [place]. Cardiovascular system: S1 & S2 heard, RRR. No JVD, murmurs, rubs, gallops or clicks. No pedal edema. Gastrointestinal system: Abdomen is nondistended, soft and nontender. No organomegaly or masses felt. Normal bowel sounds heard. Central nervous system: Alert and oriented. No focal neurological deficits. Extremities: Symmetric 5 x 5 power. Skin: No rashes, lesions or ulcers Psychiatry: Judgement and insight appear normal. Mood & affect appropriate.     Data Reviewed: I have personally reviewed following labs and imaging studies  CBC: Recent Labs  Lab 09/14/19 1624 09/15/19 0959 09/16/19 0314  WBC 15.4* 10.9* 10.7*  HGB 11.8* 10.3* 10.5*  HCT 37.5 33.2* 32.9*  MCV 98.7 98.2 96.8  PLT 259 222 786   Basic Metabolic Panel: Recent Labs  Lab 09/14/19 1624 09/15/19 0959 09/16/19 0314  NA 140 139 137  K 3.7 3.6 4.6  CL 97* 96* 97*  CO2 28 30 27   GLUCOSE 92 94 71  BUN 11 15 22   CREATININE 3.11* 3.91* 4.70*  CALCIUM 7.9* 8.3* 8.3*   GFR: Estimated Creatinine Clearance: 6.6  mL/min (A) (by C-G formula based on SCr of 4.7 mg/dL (H)). Liver Function Tests: Recent Labs  Lab 09/14/19 1624 09/15/19 0959 09/16/19 0314  AST 21 18 25   ALT 6 9 6   ALKPHOS 104 89 85  BILITOT 0.5 0.7 1.4*  PROT 6.1* 5.8* 5.6*  ALBUMIN 2.3* 2.1* 2.0*   No results for input(s): LIPASE, AMYLASE in the last 168 hours. No results for input(s): AMMONIA in the last 168 hours. Coagulation Profile: No results for input(s): INR, PROTIME in the last 168 hours. Cardiac Enzymes: No results for input(s): CKTOTAL, CKMB, CKMBINDEX, TROPONINI in the last 168 hours. BNP (last 3 results) No results for input(s): PROBNP in the last 8760 hours. HbA1C: No results for input(s): HGBA1C in the last 72 hours. CBG: Recent Labs  Lab 09/14/19 2121  GLUCAP 88   Lipid Profile: No results for input(s): CHOL, HDL, LDLCALC, TRIG, CHOLHDL, LDLDIRECT  in the last 72 hours. Thyroid Function Tests: No results for input(s): TSH, T4TOTAL, FREET4, T3FREE, THYROIDAB in the last 72 hours. Anemia Panel: No results for input(s): VITAMINB12, FOLATE, FERRITIN, TIBC, IRON, RETICCTPCT in the last 72 hours. Sepsis Labs: Recent Labs  Lab 09/14/19 2117  PROCALCITON 0.41  LATICACIDVEN 1.1    Recent Results (from the past 240 hour(s))  SARS CORONAVIRUS 2 (TAT 6-24 HRS) Nasopharyngeal Nasopharyngeal Swab     Status: None   Collection Time: 09/07/19  7:03 PM   Specimen: Nasopharyngeal Swab  Result Value Ref Range Status   SARS Coronavirus 2 NEGATIVE NEGATIVE Final    Comment: (NOTE) SARS-CoV-2 target nucleic acids are NOT DETECTED. The SARS-CoV-2 RNA is generally detectable in upper and lower respiratory specimens during the acute phase of infection. Negative results do not preclude SARS-CoV-2 infection, do not rule out co-infections with other pathogens, and should not be used as the sole basis for treatment or other patient management decisions. Negative results must be combined with clinical  observations, patient history, and epidemiological information. The expected result is Negative. Fact Sheet for Patients: SugarRoll.be Fact Sheet for Healthcare Providers: https://www.woods-mathews.com/ This test is not yet approved or cleared by the [place] FDA and  has been authorized for detection and/or diagnosis of SARS-CoV-2 by FDA under an Emergency Use Authorization (EUA). This EUA will remain  in effect (meaning this test can be used) for the duration of the COVID-19 declaration under Section 56 4(b)(1) of the Act, 21 U.S.C. section 360bbb-3(b)(1), unless the authorization is terminated or revoked sooner. Performed at [facility], [place] [address] [place], [place] [address]   Respiratory Panel by RT PCR (Flu A&B, Covid) - Nasopharyngeal Swab     Status: None   Collection Time: 09/14/19  6:46 PM   Specimen: Nasopharyngeal Swab  Result Value Ref Range Status   SARS Coronavirus 2 by RT PCR NEGATIVE NEGATIVE Final    Comment: (NOTE) SARS-CoV-2 target nucleic acids are NOT DETECTED. The SARS-CoV-2 RNA is generally detectable in upper respiratoy specimens during the acute phase of infection. The lowest concentration of SARS-CoV-2 viral copies this assay can detect is 131 copies/mL. A negative result does not preclude SARS-Cov-2 infection and should not be used as the sole basis for treatment or other patient management decisions. A negative result may occur with  improper specimen collection/handling, submission of specimen other than nasopharyngeal swab, presence of viral mutation(s) within the areas targeted by this assay, and inadequate number of viral copies (<131 copies/mL). A negative result must be combined with clinical observations, patient history, and epidemiological information. The expected result is Negative. Fact Sheet for Patients:  PinkCheek.be Fact Sheet for Healthcare  Providers:  GravelBags.it This test is not yet ap proved or cleared by the [place] FDA and  has been authorized for detection and/or diagnosis of SARS-CoV-2 by FDA under an Emergency Use Authorization (EUA). This EUA will remain  in effect (meaning this test can be used) for the duration of the COVID-19 declaration under Section 564(b)(1) of the Act, 21 U.S.C. section 360bbb-3(b)(1), unless the authorization is terminated or revoked sooner.    Influenza A by PCR NEGATIVE NEGATIVE Final   Influenza B by PCR NEGATIVE NEGATIVE Final    Comment: (NOTE) The Xpert Xpress SARS-CoV-2/FLU/RSV assay is intended as an aid in  the diagnosis of influenza from Nasopharyngeal swab specimens and  should not be used as a sole basis for treatment. Nasal washings and  aspirates are unacceptable for  Xpert Xpress SARS-CoV-2/FLU/RSV  testing. Fact Sheet for Patients: PinkCheek.be Fact Sheet for Healthcare Providers: GravelBags.it This test is not yet approved or cleared by the [place] FDA and  has been authorized for detection and/or diagnosis of SARS-CoV-2 by  FDA under an Emergency Use Authorization (EUA). This EUA will remain  in effect (meaning this test can be used) for the duration of the  Covid-19 declaration under Section 564(b)(1) of the Act, 21  U.S.C. section 360bbb-3(b)(1), unless the authorization is  terminated or revoked. Performed at [facility], [place] [address] [place], [place] [address]   Culture, blood (Routine X 2) w Reflex to ID Panel     Status: None (Preliminary result)   Collection Time: 09/15/19  6:42 AM   Specimen: BLOOD LEFT HAND  Result Value Ref Range Status   Specimen Description BLOOD LEFT HAND  Final   Special Requests   Final    BOTTLES DRAWN AEROBIC ONLY Blood Culture results may not be optimal due to an inadequate volume of blood received in culture bottles    Culture   Final    NO GROWTH 1 DAY Performed at [facility], [place] [address] [place], [place] [address]    Report Status PENDING  Incomplete  Culture, blood (Routine X 2) w Reflex to ID Panel     Status: None (Preliminary result)   Collection Time: 09/15/19 10:00 AM   Specimen: BLOOD LEFT HAND  Result Value Ref Range Status   Specimen Description BLOOD LEFT HAND  Final   Special Requests   Final    BOTTLES DRAWN AEROBIC AND ANAEROBIC Blood Culture adequate volume   Culture   Final    NO GROWTH < 24 HOURS Performed at [facility], [place] [address], [place], [place] [address]    Report Status PENDING  Incomplete         Radiology Studies: CT Head Wo Contrast  Result Date: 09/14/2019 CLINICAL DATA:  Altered mental status. EXAM: CT HEAD WITHOUT CONTRAST TECHNIQUE: Contiguous axial images were obtained from the base of the skull through the vertex without intravenous contrast. COMPARISON:  September 09, 2019 FINDINGS: Brain: There is mild cerebral atrophy with widening of the extra-axial spaces and ventricular dilatation. There are areas of decreased attenuation within the white matter tracts of the supratentorial brain, consistent with microvascular disease changes. Small chronic bilateral basal ganglia lacunar infarcts are seen. Vascular: No hyperdense vessel or unexpected calcification. Skull: Normal. Negative for fracture or focal lesion. Sinuses/Orbits: No acute finding. Other: None. IMPRESSION: 1. Generalized cerebral atrophy. 2. No acute intracranial abnormality. Electronically Signed   By: Virgina Norfolk M.D.   On: 09/14/2019 19:23   DG Chest Port 1 View  Result Date: 09/14/2019 CLINICAL DATA:  Altered mental status. The fell and hit head on 04/24, followed by a normal head CT. EXAM: PORTABLE CHEST 1 VIEW COMPARISON:  08/31/2019 FINDINGS: Patient has LEFT-sided transvenous pacemaker with leads to the RIGHT atrium and RIGHT ventricle. The heart is enlarged. There is  increased opacity at the LEFT lung base, consistent with atelectasis or infiltrate. Suspect LEFT pleural effusion. The increased prominence of interstitial markings raises a question of interstitial edema. IMPRESSION: 1. Cardiomegaly and possible interstitial edema. 2. Increased LEFT lower lobe atelectasis or infiltrate. 3. Probable LEFT pleural effusion. Electronically Signed   By: Nolon Nations M.D.   On: 09/14/2019 19:18        Scheduled Meds: . amiodarone  200 mg Oral Daily  .  Chlorhexidine Gluconate Cloth  6 each Topical Q0600  . feeding supplement (ENSURE ENLIVE)  237 mL Oral TID BM  . heparin  5,000 Units Subcutaneous Q8H  . isosorbide mononitrate  60 mg Oral Daily  . multivitamin  1 tablet Oral QHS  . pravastatin  40 mg Oral QHS   Continuous Infusions: . cefTRIAXone (ROCEPHIN)  IV Stopped (09/15/19 1241)     LOS: 2 days    Time spent: 30 minutes    Gleb Mcguire Darleen Crocker, DO [facility]  If 7PM-7AM, please contact night-coverage [email or web address] 09/16/2019, 1:29 PM

## 2019-09-16 NOTE — Procedures (Signed)
Patient seen on Hemodialysis. BP (!) 135/51   Pulse 77   Temp 98.5 F (36.9 C) (Oral)   Resp 18   Ht 4\' 11"  (1.499 m)   Wt 53.2 kg   LMP  (LMP Unknown)   SpO2 95%   BMI 23.69 kg/m   QB 400, UF goal 2L Complains of feeling poorly at dialysis and "can't think straight because they haven't fed me". She is confused and having significant difficulty recalling events. I appreciate the palliative care input and support that she would do much better (from a QOL standpoint) from transitioning to comfort care measures/conservative medical measures only and stopping dialysis at this point with her recent setbacks and deconditioning/physical limitations.    Elmarie Shiley MD [facility]. Office # [phone] Pager # [phone] 10:19 AM

## 2019-09-16 NOTE — NC FL2 (Signed)
[place] LEVEL OF CARE SCREENING TOOL     IDENTIFICATION  Patient Name: Valerie Terrell Birthdate: 01-Aug-1932 Sex: female Admission Date (Current Location): 09/14/2019  [facility] and [place] Number:  Herbalist and Address:  [facility], [place] [address] [place], [place] [address]      Provider Number: [id number]  Attending Physician Name and Address:  Rodena Goldmann, DO  Relative Name and Phone Number:  Lissa Rowles    Current Level of Care: Hospital Recommended Level of Care: [place] Prior Approval Number:    Date Approved/Denied:   PASRR Number: [id number]  Discharge Plan: SNF    Current Diagnoses: Patient Active Problem List   Diagnosis Date Noted  . HAP (hospital-acquired pneumonia) 09/14/2019  . Acute encephalopathy 09/14/2019  . Palliative care by specialist   . Goals of care, counseling/discussion   . DNR (do not resuscitate)   . Inadequate pain control   . Compression fracture of T11 vertebra ([place]) 09/01/2019  . Acute diverticulitis 09/01/2019  . Diverticulitis 08/31/2019  . Pressure injury of skin 08/17/2019  . Volume overload 08/16/2019  . Disorder of phosphorus metabolism, unspecified 05/15/2019  . Hypoxemia 03/24/2019  . Anaphylactic shock, unspecified, sequela 02/23/2019  . Heterotopic ossification 12/29/2018  . Pain, unspecified 10/15/2018  . Hip fracture ([place]) 10/04/2018  . Fracture of pubic ramus ([place]) 07/19/2018  . Fracture of sacrum ([place]) 07/19/2018  . Perineal hematoma 07/19/2018  . Fever 07/19/2018  . Acute blood loss anemia 07/19/2018  . ESRD (end stage renal disease) ([place]) 05/20/2018  . Chronic back pain 05/14/2018  . Chronic pain syndrome 05/14/2018  . Iron deficiency anemia, unspecified 04/25/2018  . Hypoxia 12/14/2017  . Dyspnea 12/14/2017  . Secondary hyperparathyroidism of renal origin ([place]) 08/16/2017  . Degeneration of thoracic intervertebral disc  07/26/2017  . Moderate protein-calorie malnutrition ([place]) 02/23/2017  . Acute respiratory failure with hypoxia ([place]) 02/13/2017  . Hyperkalemia 02/13/2017  . ESRD on dialysis ([place]) 02/11/2017  . Chronic respiratory failure with hypoxia ([place]) 02/11/2017  . Weakness generalized 02/07/2017  . Poor tolerance for ambulation 02/07/2017  . Anemia in chronic kidney disease 02/04/2017  . Coagulation defect, unspecified ([place]) 02/04/2017  . Fracture of one rib, right side, initial encounter for closed fracture 01/11/2017  . Closed fracture of one rib of right side   . Chronic combined systolic and diastolic heart failure ([place]) 01/05/2017  . Palpitations 12/15/2016  . Hypokalemia 12/14/2016  . Leukocytosis 12/14/2016  . Long term (current) use of anticoagulants [Z79.01] 12/11/2016  . Non-ischemic cardiomyopathy ([place]) 12/07/2016  . Acute combined systolic and diastolic heart failure ([place])  12/07/2016  . Cardiac pacemaker   . Acute on chronic respiratory failure with hypoxia ([place])   . Acute pulmonary edema with congestive heart failure ([place]) 11/30/2016  . Elevated troponin level 11/30/2016  . Anemia 11/30/2016  . Chronic midline thoracic back pain 09/15/2016  . History of CVA (cerebrovascular accident) 09/15/2016  . Hyperlipidemia   . Nausea & vomiting 08/05/2016  . Dehydration 08/05/2016  . Osteoporosis 07/28/2016  . Closed compression fracture of thoracic vertebra ([place]) 07/23/2016  . Thoracic compression fracture ([place]) 07/22/2016  . Flank pain 07/11/2016  . Hypoalbuminemia 07/11/2016  . Renal artery stenosis ([place]) 06/24/2016  . Nondisplaced fracture of fifth metatarsal bone, left foot, initial encounter for closed fracture 06/10/2016  . Rib contusion, left, initial encounter 06/10/2016  . Single kidney 03/13/2016  . Chest pain   . Dyslipidemia   .  PAF (paroxysmal atrial fibrillation) ([place]) 01/28/2016  . Essential hypertension 01/28/2016  . History of cardioembolic stroke 75/02/2584  .  Personal history of subarachnoid hemorrhage 01/28/2016  . CKD (chronic kidney disease), stage IV ([place]) 01/28/2016  . Gait disturbance 01/27/2016  . Mitral valve disease 01/08/2014  . Colon polyps 07/10/2013  . Lumbar radiculopathy 02/04/2011    Orientation RESPIRATION BLADDER Height & Weight     Self  Normal Continent Weight: 117 lb 4.6 oz (53.2 kg) Height:  4\' 11"  (149.9 cm)  BEHAVIORAL SYMPTOMS/MOOD NEUROLOGICAL BOWEL NUTRITION STATUS      Incontinent Diet(See discharge summary)  AMBULATORY STATUS COMMUNICATION OF NEEDS Skin   Extensive Assist Verbally Other (Comment)(Stage 1 pressure injury sacrum)                       Personal Care Assistance Level of Assistance  Bathing, Feeding, Dressing, Total care Bathing Assistance: Maximum assistance Feeding assistance: Limited assistance Dressing Assistance: Maximum assistance Total Care Assistance: Maximum assistance   Functional Limitations Info    Sight Info: Impaired Hearing Info: Impaired(Hearing aid) Speech Info: Adequate    SPECIAL CARE FACTORS FREQUENCY  PT (By licensed PT), OT (By licensed OT)     PT Frequency: 5x a week OT Frequency: 5x a week            Contractures Contractures Info: Not present    Additional Factors Info  Code Status, Allergies Code Status Info: DNR Allergies Info: Clonidine Derivatives, Adhesive (Tape), Codeine, Nickel, Sulfa Antibiotics           Current Medications (09/16/2019):  This is the current hospital active medication list Current Facility-Administered Medications  Medication Dose Route Frequency Provider Last Rate Last Admin  . amiodarone (PACERONE) tablet 200 mg  200 mg Oral Daily Fair, Marin Shutter, MD   200 mg at 09/16/19 1549  . bisacodyl (DULCOLAX) suppository 10 mg  10 mg Rectal Daily PRN Rosezella Rumpf, NP      . cefTRIAXone (ROCEPHIN) 2 g in sodium chloride 0.9 % 100 mL IVPB  2 g Intravenous Q24H Edwin Dada, MD 200 mL/hr at 09/16/19 1552 2 g at  09/16/19 1552  . Chlorhexidine Gluconate Cloth 2 % PADS 6 each  6 each Topical Q0600 Janalee Dane, PA-C   6 each at 09/16/19 0559  . feeding supplement (ENSURE ENLIVE) (ENSURE ENLIVE) liquid 237 mL  237 mL Oral TID BM Danford, Suann Larry, MD   237 mL at 09/15/19 1943  . fentaNYL (SUBLIMAZE) injection 25 mcg  25 mcg Intravenous Q2H PRN Rosezella Rumpf, NP      . heparin injection 5,000 Units  5,000 Units Subcutaneous Q8H Chauncey Mann, MD   5,000 Units at 09/16/19 1549  . isosorbide mononitrate (IMDUR) 24 hr tablet 60 mg  60 mg Oral Daily Fair, Marin Shutter, MD   60 mg at 09/16/19 1549  . multivitamin (RENA-VIT) tablet 1 tablet  1 tablet Oral QHS Edwin Dada, MD   1 tablet at 09/15/19 2144  . pravastatin (PRAVACHOL) tablet 40 mg  40 mg Oral QHS Chauncey Mann, MD   40 mg at 09/15/19 2144     Discharge Medications: Please see discharge summary for a list of discharge medications.  Relevant Imaging Results:  Relevant Lab Results:   Additional Information SSN: [id number]; HD TTS at [facility]  [employer], [place]

## 2019-09-17 LAB — CBC
HCT: 28.5 % — ABNORMAL LOW (ref 36.0–46.0)
Hemoglobin: 9 g/dL — ABNORMAL LOW (ref 12.0–15.0)
MCH: 30.2 pg (ref 26.0–34.0)
MCHC: 31.6 g/dL (ref 30.0–36.0)
MCV: 95.6 fL (ref 80.0–100.0)
Platelets: 216 10*3/uL (ref 150–400)
RBC: 2.98 MIL/uL — ABNORMAL LOW (ref 3.87–5.11)
RDW: 16.6 % — ABNORMAL HIGH (ref 11.5–15.5)
WBC: 8.7 10*3/uL (ref 4.0–10.5)
nRBC: 0 % (ref 0.0–0.2)

## 2019-09-17 LAB — COMPREHENSIVE METABOLIC PANEL
ALT: <5 U/L (ref 0–44)
AST: 20 U/L (ref 15–41)
Albumin: 1.7 g/dL — ABNORMAL LOW (ref 3.5–5.0)
Alkaline Phosphatase: 70 U/L (ref 38–126)
Anion gap: 10 (ref 5–15)
BUN: 10 mg/dL (ref 8–23)
CO2: 29 mmol/L (ref 22–32)
Calcium: 8 mg/dL — ABNORMAL LOW (ref 8.9–10.3)
Chloride: 98 mmol/L (ref 98–111)
Creatinine, Ser: 2.28 mg/dL — ABNORMAL HIGH (ref 0.44–1.00)
GFR calc Af Amer: 22 mL/min — ABNORMAL LOW (ref >60)
GFR calc non Af Amer: 19 mL/min — ABNORMAL LOW (ref >60)
Glucose, Bld: 83 mg/dL (ref 70–99)
Potassium: 3.5 mmol/L (ref 3.5–5.1)
Sodium: 137 mmol/L (ref 135–145)
Total Bilirubin: 0.7 mg/dL (ref 0.3–1.2)
Total Protein: 4.8 g/dL — ABNORMAL LOW (ref 6.5–8.1)

## 2019-09-17 MED ORDER — DARBEPOETIN ALFA 40 MCG/0.4ML IJ SOSY
40.0000 ug | PREFILLED_SYRINGE | INTRAMUSCULAR | Status: DC
  Administered 2019-09-18: 40 ug via INTRAVENOUS
  Filled 2019-09-17: qty 0.4

## 2019-09-17 MED ORDER — OXYCODONE HCL 10 MG PO TABS: 20.0000 mg | ORAL_TABLET | Freq: Four times a day (QID) | ORAL | 0 refills | Status: DC | PRN

## 2019-09-17 NOTE — Progress Notes (Addendum)
   Palliative Medicine Inpatient Follow Up Note   HPI: Per intake H&P --> Valerie Eddie Bennettis a 86 y.o.femalewith PMH PAF, hx CVA, ESRD, T12 compression, NICM with pacemaker, HTN, HLD and recent diverticulitis who presented to ER for altered mental status and weakness and found to have HCAP.  Palliative care was involved upon [name] last admission to aid in symptom management and ongoing [place] conversations.   Today's Discussion (09/17/2019): Chart reviewed. Patient remained delerious throughout much of the day yesterday. She shared that no one had fed her which I was able to verify with staff was no accurate. This morning she is bright eyed and oriented to person and place.   I met with [name] and her daughter, [name] at bedside this morning.  [name] and I discussed her present health state and the physical and emotional burdens she has suffered recently. I again reiterated the approach we could take to caring for her. One approach would be to continue all present measures and hope for improvement. The other approach would be to shift of focus from curative to comfort oriented.   [name] remains adamant about going to skilled nursing and remaining on hemodialysis.  I shared with her that I understand and we will certainly respect these wishes.  [name] shares a variety of concerns about generalized care which has been brought to the attention of staff. I kindly vocalized to [name] that there are sometimes urgent patient needs that are occurring so staff have to triage what is of most importance.   Discussed the importance of continued conversation with family and their  medical providers regarding overall plan of care and treatment options, ensuring decisions are within the context of the patients values and GOCs.  Vital Signs Vitals:   09/16/19 2316 09/17/19 0736  BP: (!) 128/52 (!) 136/47  Pulse: 84 77  Resp: 14 18  Temp: 98.2 F (36.8 C) 98 F (36.7 C)  SpO2: 94% 100%     Intake/Output Summary (Last 24 hours) at 09/17/2019 3300 Last data filed at 09/17/2019 0600 Gross per 24 hour  Intake --  Output 1346 ml  Net -1346 ml   Last Weight  Most recent update: 09/16/2019  5:15 PM   Weight  52 kg (114 lb 10.2 oz)           SUMMARY OF RECOMMENDATIONS DNAR/DNI  Continue current modalities of care  continue HD  SNF Placement  Chaplain Consult  Time Spent: 35 Greater than 50% of the time was spent in counseling and coordination of care ______________________________________________________________________________________ [place] Team Team Cell Phone: [phone] Please utilize secure chat with additional questions, if there is no response within 30 minutes please call the above phone number  Palliative Medicine Team providers are available by phone from 7am to 7pm daily and can be reached through the team cell phone.  Should this patient require assistance outside of these hours, please call the patient's attending physician.

## 2019-09-17 NOTE — Progress Notes (Signed)
PROGRESS NOTE    Valerie Terrell  TWS:[id number] DOB: 1932/10/24 DOA: 09/14/2019 PCP: Valerie Sou, MD   Brief Narrative:  Mrs. Valerie Terrell a 86 y.o.Fwith hx CHF, chronic hypoxic respiratory failure on 2L, ESRD on HD TThS, HTN, pAF no longer on AC, NICM and SSS with pacer and hx stroke who presented from HD with hypoxia, decreased sensorium, confusion and generalized weakness for 1-2 days.  Patient recently admitted for a week for confusion, discharged 1 week ago. Diagnosed with acute diverticulitis complicated by G01 compression fracture and delirium. Got kyphoplasty, was discharged to home, was doing better until a few days ago. On day of admission, HHPT noted hypoxia, but patient was well enough to go to HD. There she was hypoxic again, too sleepy on HD to tolerate, and was sent to ER.  In the ER, WBC 15K. CXR showed new left effusion and infiltrate.  5/1: Patient receiving hemodialysis today. Awaiting palliative discussion with family members. No acute overnight events noted. No concerns or complaints this am.  5/2: Patient appears to be doing well this morning with no symptomatic complaints or concerns.  Plans for further hemodialysis in a.m.  CSW working on placement to SNF which is still pending.  She is not agreeable to hospice at this time.  Potential discharge in next 24 hours after hemodialysis tomorrow if SNF bed available.  Assessment & Plan:   Principal Problem:   HAP (hospital-acquired pneumonia) Active Problems:   PAF (paroxysmal atrial fibrillation) (HCC)   Essential hypertension   Hyperlipidemia   History of CVA (cerebrovascular accident)   Cardiac pacemaker   ESRD on dialysis ([facility])   Acute respiratory failure with hypoxia (HCC)   Hypoxia   Moderate protein-calorie malnutrition (HCC)   Compression fracture of T11 vertebra (HCC)   Acute encephalopathy   Community acquired pneumonia left lower lobe Recent Abx, but mild disease, no history drug  resistant infections. -Continue ceftriaxone day 4/5 with antibiotics and can likely discontinue after 5-day course and prior to discharge.   ESRD on HD MTThS -Appreciate nephrology for hemodialysis with further hemodialysis planned for tomorrow  Anemia-downtrending -No overt bleeding noted -Nephrology planning to reduce ESA with dialysis  Chronic systolic and diastolic CHF Chronic hypoxic respiratory failure Non-ischemic cardiomyopathy SSS with pacer  Paroxysmal atrial fibrillation Not on AC due to recurrent falls. -Continue amiodarone  Hypertension-controlled Cerebrovascular disease secondary prevention BPnormal -ContinueImdur, statin  Stage I Pressure injury sacrum, POA -Continue to monitor  Failure to thrive As evidenced by BMI 22, chronic renal and cardiac failure, poor PO intake, pressure injury. -Palliative discussion noted on 5/1 as well as 5/2, but patient wants to continue on dialysis and full scope of care. -Planning for discharge to SNF    DVT prophylaxis:Heparin Code Status: DNR Family Communication:  Discussed with daughter on phone Disposition Plan:  Plan to discharge to SNF once bed available.  Further hemodialysis per nephrology tomorrow.  Patient has had discussions with palliative care, but does not want to consider hospice at this point.  Complete antibiotic course prior to discharge.  Anticipate likely discharge to SNF in the next 24 hours after hemodialysis.   Consultants:   Nephrology  Palliative Care  Procedures:   None  Antimicrobials:  Anti-infectives (From admission, onward)   Start     Dose/Rate Route Frequency Ordered Stop   09/16/19 1700  vancomycin (VANCOREADY) IVPB 500 mg/100 mL  Status:  Discontinued     500 mg 100 mL/hr over 60 Minutes Intravenous Once per day  on Mon Tue Thu Sat 09/15/19 1457 09/15/19 1457   09/15/19 1200  cefTRIAXone (ROCEPHIN) 2 g in sodium chloride 0.9 % 100 mL IVPB     2 g 200 mL/hr over 30  Minutes Intravenous Every 24 hours 09/15/19 1008     09/15/19 0000  piperacillin-tazobactam (ZOSYN) IVPB 4.5 g  Status:  Discontinued     4.5 g 200 mL/hr over 30 Minutes Intravenous Every 6 hours 09/14/19 2209 09/14/19 2215   09/14/19 2300  piperacillin-tazobactam (ZOSYN) IVPB 3.375 g  Status:  Discontinued     3.375 g 12.5 mL/hr over 240 Minutes Intravenous Every 8 hours 09/14/19 2215 09/14/19 2226   09/14/19 2300  piperacillin-tazobactam (ZOSYN) IVPB 2.25 g  Status:  Discontinued     2.25 g 100 mL/hr over 30 Minutes Intravenous Every 8 hours 09/14/19 2226 09/15/19 1008   09/14/19 2230  vancomycin (VANCOCIN) IVPB 1000 mg/200 mL premix     1,000 mg 200 mL/hr over 60 Minutes Intravenous  Once 09/14/19 2225 09/15/19 0033   09/14/19 2100  cefTRIAXone (ROCEPHIN) 2 g in sodium chloride 0.9 % 100 mL IVPB     2 g 200 mL/hr over 30 Minutes Intravenous  Once 09/14/19 2052 09/14/19 2154   09/14/19 2100  azithromycin (ZITHROMAX) 500 mg in sodium chloride 0.9 % 250 mL IVPB  Status:  Discontinued     500 mg 250 mL/hr over 60 Minutes Intravenous  Once 09/14/19 2052 09/14/19 2209       Subjective: Patient seen and evaluated today with no new acute complaints or concerns. No acute concerns or events noted overnight.  Objective: Vitals:   09/16/19 1230 09/16/19 1630 09/16/19 2316 09/17/19 0736  BP: (!) 122/44 (!) 95/32 (!) 128/52 (!) 136/47  Pulse: 67 80 84 77  Resp:  17 14 18   Temp: 97.9 F (36.6 C) 98 F (36.7 C) 98.2 F (36.8 C) 98 F (36.7 C)  TempSrc: Oral  Oral   SpO2: 97% 97% 94% 100%  Weight: 52 kg     Height:        Intake/Output Summary (Last 24 hours) at 09/17/2019 1227 Last data filed at 09/17/2019 0600 Gross per 24 hour  Intake -  Output 1 ml  Net -1 ml   Filed Weights   09/14/19 1616 09/16/19 0748 09/16/19 1230  Weight: 50 kg 53.2 kg 52 kg    Examination:  General exam: Appears calm and comfortable  Respiratory system: Clear to auscultation. Respiratory effort  normal.  Currently on nasal cannula 1-2 L. Cardiovascular system: S1 & S2 heard, RRR. No JVD, murmurs, rubs, gallops or clicks. No pedal edema. Gastrointestinal system: Abdomen is nondistended, soft and nontender. No organomegaly or masses felt. Normal bowel sounds heard. Central nervous system: Alert and oriented. No focal neurological deficits. Extremities: Symmetric 5 x 5 power. Skin: No rashes, lesions or ulcers Psychiatry: Judgement and insight appear normal. Mood & affect appropriate.     Data Reviewed: I have personally reviewed following labs and imaging studies  CBC: Recent Labs  Lab 09/14/19 1624 09/15/19 0959 09/16/19 0314 09/17/19 0508  WBC 15.4* 10.9* 10.7* 8.7  HGB 11.8* 10.3* 10.5* 9.0*  HCT 37.5 33.2* 32.9* 28.5*  MCV 98.7 98.2 96.8 95.6  PLT 259 222 245 765   Basic Metabolic Panel: Recent Labs  Lab 09/14/19 1624 09/15/19 0959 09/16/19 0314 09/17/19 0508  NA 140 139 137 137  K 3.7 3.6 4.6 3.5  CL 97* 96* 97* 98  CO2 28 30 27  29  GLUCOSE 92 94 71 83  BUN 11 15 22 10   CREATININE 3.11* 3.91* 4.70* 2.28*  CALCIUM 7.9* 8.3* 8.3* 8.0*   GFR: Estimated Creatinine Clearance: 13.5 mL/min (A) (by C-G formula based on SCr of 2.28 mg/dL (H)). Liver Function Tests: Recent Labs  Lab 09/14/19 1624 09/15/19 0959 09/16/19 0314 09/17/19 0508  AST 21 18 25 20   ALT 6 9 6  <5  ALKPHOS 104 89 85 70  BILITOT 0.5 0.7 1.4* 0.7  PROT 6.1* 5.8* 5.6* 4.8*  ALBUMIN 2.3* 2.1* 2.0* 1.7*   No results for input(s): LIPASE, AMYLASE in the last 168 hours. No results for input(s): AMMONIA in the last 168 hours. Coagulation Profile: No results for input(s): INR, PROTIME in the last 168 hours. Cardiac Enzymes: No results for input(s): CKTOTAL, CKMB, CKMBINDEX, TROPONINI in the last 168 hours. BNP (last 3 results) No results for input(s): PROBNP in the last 8760 hours. HbA1C: No results for input(s): HGBA1C in the last 72 hours. CBG: Recent Labs  Lab 09/14/19 2121   GLUCAP 88   Lipid Profile: No results for input(s): CHOL, HDL, LDLCALC, TRIG, CHOLHDL, LDLDIRECT in the last 72 hours. Thyroid Function Tests: No results for input(s): TSH, T4TOTAL, FREET4, T3FREE, THYROIDAB in the last 72 hours. Anemia Panel: No results for input(s): VITAMINB12, FOLATE, FERRITIN, TIBC, IRON, RETICCTPCT in the last 72 hours. Sepsis Labs: Recent Labs  Lab 09/14/19 2117  PROCALCITON 0.41  LATICACIDVEN 1.1    Recent Results (from the past 240 hour(s))  SARS CORONAVIRUS 2 (TAT 6-24 HRS) Nasopharyngeal Nasopharyngeal Swab     Status: None   Collection Time: 09/07/19  7:03 PM   Specimen: Nasopharyngeal Swab  Result Value Ref Range Status   SARS Coronavirus 2 NEGATIVE NEGATIVE Final    Comment: (NOTE) SARS-CoV-2 target nucleic acids are NOT DETECTED. The SARS-CoV-2 RNA is generally detectable in upper and lower respiratory specimens during the acute phase of infection. Negative results do not preclude SARS-CoV-2 infection, do not rule out co-infections with other pathogens, and should not be used as the sole basis for treatment or other patient management decisions. Negative results must be combined with clinical observations, patient history, and epidemiological information. The expected result is Negative. Fact Sheet for Patients: SugarRoll.be Fact Sheet for Healthcare Providers: https://www.woods-mathews.com/ This test is not yet approved or cleared by the [place] FDA and  has been authorized for detection and/or diagnosis of SARS-CoV-2 by FDA under an Emergency Use Authorization (EUA). This EUA will remain  in effect (meaning this test can be used) for the duration of the COVID-19 declaration under Section 56 4(b)(1) of the Act, 21 U.S.C. section 360bbb-3(b)(1), unless the authorization is terminated or revoked sooner. Performed at [facility], [place] [address], [place], [place] [address]   Respiratory  Panel by RT PCR (Flu A&B, Covid) - Nasopharyngeal Swab     Status: None   Collection Time: 09/14/19  6:46 PM   Specimen: Nasopharyngeal Swab  Result Value Ref Range Status   SARS Coronavirus 2 by RT PCR NEGATIVE NEGATIVE Final    Comment: (NOTE) SARS-CoV-2 target nucleic acids are NOT DETECTED. The SARS-CoV-2 RNA is generally detectable in upper respiratoy specimens during the acute phase of infection. The lowest concentration of SARS-CoV-2 viral copies this assay can detect is 131 copies/mL. A negative result does not preclude SARS-Cov-2 infection and should not be used as the sole basis for treatment or other patient management decisions. A negative result may occur with  improper specimen collection/handling,  submission of specimen other than nasopharyngeal swab, presence of viral mutation(s) within the areas targeted by this assay, and inadequate number of viral copies (<131 copies/mL). A negative result must be combined with clinical observations, patient history, and epidemiological information. The expected result is Negative. Fact Sheet for Patients:  PinkCheek.be Fact Sheet for Healthcare Providers:  GravelBags.it This test is not yet ap proved or cleared by the [place] FDA and  has been authorized for detection and/or diagnosis of SARS-CoV-2 by FDA under an Emergency Use Authorization (EUA). This EUA will remain  in effect (meaning this test can be used) for the duration of the COVID-19 declaration under Section 564(b)(1) of the Act, 21 U.S.C. section 360bbb-3(b)(1), unless the authorization is terminated or revoked sooner.    Influenza A by PCR NEGATIVE NEGATIVE Final   Influenza B by PCR NEGATIVE NEGATIVE Final    Comment: (NOTE) The Xpert Xpress SARS-CoV-2/FLU/RSV assay is intended as an aid in  the diagnosis of influenza from Nasopharyngeal swab specimens and  should not be used as a sole basis for  treatment. Nasal washings and  aspirates are unacceptable for Xpert Xpress SARS-CoV-2/FLU/RSV  testing. Fact Sheet for Patients: PinkCheek.be Fact Sheet for Healthcare Providers: GravelBags.it This test is not yet approved or cleared by the [place] FDA and  has been authorized for detection and/or diagnosis of SARS-CoV-2 by  FDA under an Emergency Use Authorization (EUA). This EUA will remain  in effect (meaning this test can be used) for the duration of the  Covid-19 declaration under Section 564(b)(1) of the Act, 21  U.S.C. section 360bbb-3(b)(1), unless the authorization is  terminated or revoked. Performed at [facility], [place] [address], [place], [place] [address]   Culture, blood (Routine X 2) w Reflex to ID Panel     Status: None (Preliminary result)   Collection Time: 09/15/19  6:42 AM   Specimen: BLOOD LEFT HAND  Result Value Ref Range Status   Specimen Description BLOOD LEFT HAND  Final   Special Requests   Final    BOTTLES DRAWN AEROBIC ONLY Blood Culture results may not be optimal due to an inadequate volume of blood received in culture bottles   Culture   Final    NO GROWTH 2 DAYS Performed at [facility], [place] [address], [place], [place] [address]    Report Status PENDING  Incomplete  Culture, blood (Routine X 2) w Reflex to ID Panel     Status: None (Preliminary result)   Collection Time: 09/15/19 10:00 AM   Specimen: BLOOD LEFT HAND  Result Value Ref Range Status   Specimen Description BLOOD LEFT HAND  Final   Special Requests   Final    BOTTLES DRAWN AEROBIC AND ANAEROBIC Blood Culture adequate volume   Culture   Final    NO GROWTH 2 DAYS Performed at [facility], [place] [address] [place], [place] [address]    Report Status PENDING  Incomplete         Radiology Studies: No results found.      Scheduled Meds: . amiodarone  200 mg Oral Daily  . Chlorhexidine  Gluconate Cloth  6 each Topical Q0600  . [START ON 09/18/2019] darbepoetin (ARANESP) injection - DIALYSIS  40 mcg Intravenous Q Mon-HD  . feeding supplement (ENSURE ENLIVE)  237 mL Oral TID BM  . heparin  5,000 Units Subcutaneous Q8H  . isosorbide mononitrate  60 mg Oral Daily  . multivitamin  1 tablet  Oral QHS  . pravastatin  40 mg Oral QHS   Continuous Infusions: . cefTRIAXone (ROCEPHIN)  IV Stopped (09/16/19 1622)     LOS: 3 days    Time spent: 30 minutes    Raidon Swanner Darleen Crocker, DO [facility]  If 7PM-7AM, please contact night-coverage [email or web address] 09/17/2019, 12:27 PM

## 2019-09-17 NOTE — Progress Notes (Signed)
[name], female   DOB: 04-01-1933, 86 y.o.   MRN: [id number] [facility] Progress Note   Assessment/ Plan:   1.  Healthcare associated pneumonia with delirium: CXR revealing L lower lobe atelectasis vs infiltrate with pleural effusion.  On ceftriaxone and appears to be doing clinically better with regards to hypoxia and delirium state. 2.  ESRD:   She gets hemodialysis 4 days a week on a Monday/Tuesday/Thursday/Saturday schedule and underwent hemodialysis yesterday.  I will order for hemodialysis again tomorrow for outpatient schedule for continued efforts at volume management. 3.  Hypertension/volume:  Blood pressure under good control and will continue efforts at ultrafiltration for volume management.  4.  Anemia:  Downtrending hemoglobin and hematocrit, will redose ESA tomorrow with dialysis.  5.  Metabolic bone disease: Corrected calcium 9.2, phos at goal. Continue outpatient binder.  6. Diarrhea: Recent sigmoid diverticulitis, treated with antibiotics. Per primary.  7. Hx T11 compression fracture: No complaint of pain at present, narcotics on hold 8. Debility:  Following extensive discussion with the palliative care service yesterday, the patient and her daughter had made the decision to have her placed at a skilled nursing facility for continued efforts at ongoing rehabilitation as she continues dialysis.  She is not ready to stop dialysis at this time.  Subjective:   Reports to be feeling a little better this morning; "I am glad to see a familiar face this morning".   Objective:   BP (!) 128/52 (BP Location: Left Arm)   Pulse 84   Temp 98.2 F (36.8 C) (Oral)   Resp 14   Ht 4\' 11"  (1.499 m)   Wt 52 kg   LMP  (LMP Unknown)   SpO2 94%   BMI 23.15 kg/m   Physical Exam: Gen: Appears chronically ill.  Resting comfortably in bed, reading hospital brochure CVS: Pulse regular rhythm, normal rate, S1 and S2 normal Resp: Clear to auscultation  without distinct rales or rhonchi Abd: Soft, flat, nontender, bowel sounds normal Ext: Trace-1+ bilateral lower extremity edema with areas of skin bruising/skin tears.  RUA AVG with thrill.  Labs: BMET Recent Labs  Lab 09/14/19 1624 09/15/19 0959 09/16/19 0314 09/17/19 0508  NA 140 139 137 137  K 3.7 3.6 4.6 3.5  CL 97* 96* 97* 98  CO2 28 30 27 29   GLUCOSE 92 94 71 83  BUN 11 15 22 10   CREATININE 3.11* 3.91* 4.70* 2.28*  CALCIUM 7.9* 8.3* 8.3* 8.0*   CBC Recent Labs  Lab 09/14/19 1624 09/15/19 0959 09/16/19 0314 09/17/19 0508  WBC 15.4* 10.9* 10.7* 8.7  HGB 11.8* 10.3* 10.5* 9.0*  HCT 37.5 33.2* 32.9* 28.5*  MCV 98.7 98.2 96.8 95.6  PLT 259 222 245 216     Medications:    . amiodarone  200 mg Oral Daily  . Chlorhexidine Gluconate Cloth  6 each Topical Q0600  . feeding supplement (ENSURE ENLIVE)  237 mL Oral TID BM  . heparin  5,000 Units Subcutaneous Q8H  . isosorbide mononitrate  60 mg Oral Daily  . multivitamin  1 tablet Oral QHS  . pravastatin  40 mg Oral QHS   Elmarie Shiley, MD 09/17/2019, 7:04 AM

## 2019-09-18 ENCOUNTER — Encounter: Payer: Medicare Other | Admitting: *Deleted

## 2019-09-18 LAB — BASIC METABOLIC PANEL
Anion gap: 12 (ref 5–15)
BUN: 17 mg/dL (ref 8–23)
CO2: 27 mmol/L (ref 22–32)
Calcium: 8.1 mg/dL — ABNORMAL LOW (ref 8.9–10.3)
Chloride: 97 mmol/L — ABNORMAL LOW (ref 98–111)
Creatinine, Ser: 3.74 mg/dL — ABNORMAL HIGH (ref 0.44–1.00)
GFR calc Af Amer: 12 mL/min — ABNORMAL LOW (ref >60)
GFR calc non Af Amer: 10 mL/min — ABNORMAL LOW (ref >60)
Glucose, Bld: 80 mg/dL (ref 70–99)
Potassium: 3.4 mmol/L — ABNORMAL LOW (ref 3.5–5.1)
Sodium: 136 mmol/L (ref 135–145)

## 2019-09-18 LAB — CBC
HCT: 29.8 % — ABNORMAL LOW (ref 36.0–46.0)
Hemoglobin: 9.4 g/dL — ABNORMAL LOW (ref 12.0–15.0)
MCH: 29.9 pg (ref 26.0–34.0)
MCHC: 31.5 g/dL (ref 30.0–36.0)
MCV: 94.9 fL (ref 80.0–100.0)
Platelets: 220 10*3/uL (ref 150–400)
RBC: 3.14 MIL/uL — ABNORMAL LOW (ref 3.87–5.11)
RDW: 16.3 % — ABNORMAL HIGH (ref 11.5–15.5)
WBC: 10.6 10*3/uL — ABNORMAL HIGH (ref 4.0–10.5)
nRBC: 0 % (ref 0.0–0.2)

## 2019-09-18 LAB — SARS CORONAVIRUS 2 (TAT 6-24 HRS): SARS Coronavirus 2: NEGATIVE

## 2019-09-18 MED ORDER — ONDANSETRON HCL 4 MG/2ML IJ SOLN
INTRAMUSCULAR | Status: AC
  Administered 2019-09-18: 4 mg via INTRAVENOUS
  Filled 2019-09-18: qty 2

## 2019-09-18 MED ORDER — ONDANSETRON HCL 4 MG/2ML IJ SOLN: 4.0000 mg | Freq: Three times a day (TID) | INTRAMUSCULAR | Status: DC | PRN

## 2019-09-18 MED ORDER — MELATONIN 3 MG PO TABS
3.0000 mg | ORAL_TABLET | Freq: Every day | ORAL | Status: DC
  Administered 2019-09-18 – 2019-09-19 (×2): 3 mg via ORAL
  Filled 2019-09-18 (×2): qty 1

## 2019-09-18 MED ORDER — HYDROCODONE-ACETAMINOPHEN 5-325 MG PO TABS
1.0000 | ORAL_TABLET | Freq: Three times a day (TID) | ORAL | Status: DC | PRN
  Administered 2019-09-19 (×2): 1 via ORAL
  Filled 2019-09-18 (×3): qty 1

## 2019-09-18 MED ORDER — HYDROCODONE-ACETAMINOPHEN 5-325 MG PO TABS: 1.0000 | ORAL_TABLET | Freq: Four times a day (QID) | ORAL | Status: DC | PRN

## 2019-09-18 MED ORDER — DARBEPOETIN ALFA 40 MCG/0.4ML IJ SOSY
PREFILLED_SYRINGE | INTRAMUSCULAR | Status: AC
  Filled 2019-09-18: qty 0.4

## 2019-09-18 NOTE — Progress Notes (Signed)
Chaplain responded to a consult from the palliative care team. The patient expressed some disappointment at not having regained enough strength to go home. The patient expressed a desire not to go to a "Nursing Home". The chaplain provided spiritual care but noticed the patient seeming tired. The chaplain will follow-up tomorrow.  Brion Aliment Chaplain Resident For questions concerning this note please contact me by pager [phone]

## 2019-09-18 NOTE — Discharge Summary (Signed)
Physician Discharge Summary [facility]    Patient: Valerie Terrell                   Admit date: 09/14/2019   DOB: [birth year]             Discharge date:09/18/2019/11:49 AM QAS:[id number]                          PCP: Tammi Sou, MD  Disposition: SNF  Recommendations for Outpatient Follow-up:   . Follow up: as needed  Discharge Condition: Stable   Code Status:   Code Status: DNR  Diet recommendation: Renal diet   Discharge Diagnoses:    Principal Problem:   HAP (hospital-acquired pneumonia) Active Problems:   PAF (paroxysmal atrial fibrillation) ([place])   Essential hypertension   Hyperlipidemia   History of CVA (cerebrovascular accident)   Cardiac pacemaker   ESRD on dialysis ([place])   Acute respiratory failure with hypoxia ([place])   Hypoxia   Moderate protein-calorie malnutrition ([place])   Compression fracture of T11 vertebra ([place])   Acute encephalopathy   History of Present Illness/ Hospital Course Kathleen Argue Summary:   Brief Narrative:  Mrs. Hanaway a 86 y.o.Fwith hx CHF, chronic hypoxic respiratory failure on 2L, ESRD on HD TThS, HTN, pAF no longer on AC, NICM and SSS with pacer and hx stroke who presented from HD with hypoxia, decreased sensorium, confusion and generalized weakness for 1-2 days.  Patient recently admitted for a week for confusion, discharged 1 week ago. Diagnosed with acute diverticulitis complicated by N98 compression fracture and delirium. Got kyphoplasty, was discharged to home, was doing better until a few days ago. On day of admission, HHPT noted hypoxia, but patient was well enough to go to HD. There she was hypoxic again, too sleepy on HD to tolerate, and was sent to ER.  In the ER, WBC 15K. CXR showed new left effusion and infiltrate.  5/1: Patient receiving hemodialysis today. Awaiting palliative discussion with family members. No acute overnight events noted. No concerns or complaints this am.  5/2: Patient  appears to be doing well this morning with no symptomatic complaints or concerns.  Plans for further hemodialysis in a.m.  CSW working on placement to SNF which is still pending.  She is not agreeable to hospice at this time.  Potential discharge in next 24 hours after hemodialysis tomorrow if SNF bed available.  09/18/2019 -patient was seen and examined, daughter present at bedside, has agreed to discharge to SNF.  No complaints overnight.  Repeated SARS-CoV-2 continues to be negative  Detailed discharge summary   Community acquired pneumonia left lower lobe Recent Abx, but mild disease, no history drug resistant infections. -Completed 5 days of IV antibiotics Rocephin. Afebrile normotensive, cultures negative to date   ESRD on HD MTThS -Appreciate nephrology for hemodialysis with further hemodialysis Today 09/18/2019  Anemia-downtrending -No overt bleeding noted -Nephrology planning to reduce ESA with dialysis  Chronic systolic and diastolic CHF Chronic hypoxic respiratory failure Non-ischemic cardiomyopathy SSS with pacer  Paroxysmal atrial fibrillation Not on AC due to recurrent falls. -Continue amiodarone  Hypertension-controlled Cerebrovascular disease secondary prevention BPnormal -ContinueImdur, statin  Stage I Pressure injury sacrum, POA -Continue to monitor  Failure to thrive As evidenced by BMI 22, chronic renal and cardiac failure, poor PO intake, pressure injury. -Palliative discussion noted on 5/1 as well as 5/2, but patient wants to continue on dialysis and full scope of care. -Planning for discharge  to SNF     Code Status:DNR Family Communication: Discussed with daughter on phone Disposition Plan: Plan to discharge to SNF     Consultants:  Nephrology  Palliative Care  Procedures:  Hemodialysis, last 1 today 09/18/2019    Nutritional status:  Nutrition Problem: Moderate Malnutrition Etiology: chronic illness(ESRD on  HD) Signs/Symptoms: moderate fat depletion, moderate muscle depletion Interventions: Ensure Enlive (each supplement provides 350kcal and 20 grams of protein), MVI   Discharge Instructions:   Discharge Instructions    (HEART FAILURE PATIENTS) Call MD:  Anytime you have any of the following symptoms: 1) 3 pound weight gain in 24 hours or 5 pounds in 1 week 2) shortness of breath, with or without a dry hacking cough 3) swelling in the hands, feet or stomach 4) if you have to sleep on extra pillows at night in order to breathe.   Complete by: As directed    Activity as tolerated - No restrictions   Complete by: As directed    Call MD for:  difficulty breathing, headache or visual disturbances   Complete by: As directed    Call MD for:  persistant nausea and vomiting   Complete by: As directed    Call MD for:  redness, tenderness, or signs of infection (pain, swelling, redness, odor or green/yellow discharge around incision site)   Complete by: As directed    Call MD for:  temperature >100.4   Complete by: As directed    Diet - low sodium heart healthy   Complete by: As directed    Discharge instructions   Complete by: As directed    Follow-up with PCP in 1 to 2 weeks   Increase activity slowly   Complete by: As directed        Medication List    STOP taking these medications   melatonin 3 MG Tabs tablet   naloxone 0.4 MG/ML injection Commonly known as: NARCAN   Oxycodone HCl 10 MG Tabs     TAKE these medications   acetaminophen 500 MG tablet Commonly known as: TYLENOL Take 500-1,000 mg 3 (three) times daily as needed by mouth for moderate pain.   ACT Dry Mouth Lozg Use as directed 1 lozenge in the mouth or throat daily as needed (for dry mouth).   amiodarone 200 MG tablet Commonly known as: PACERONE TAKE 1 TABLET BY MOUTH  DAILY   aspirin EC 325 MG tablet Take 325 mg by mouth every evening.   b complex-vitamin c-folic acid 0.8 MG Tabs tablet Take 1 tablet by mouth  See admin instructions. Take one tablet by mouth on Tuesday, Thursday, Saturday - after dialysis   calcitonin (salmon) 200 UNIT/ACT nasal spray Commonly known as: MIACALCIN/FORTICAL Place 1 spray into alternate nostrils daily.   D-5000 125 MCG (5000 UT) Tabs Generic drug: Cholecalciferol Take 1 tablet by mouth daily.   Denta 5000 Plus 1.1 % Crea dental cream Generic drug: sodium fluoride Place 1 application onto teeth at bedtime.   docusate sodium 100 MG capsule Commonly known as: COLACE Take 100 mg by mouth daily as needed for mild constipation.   doxercalciferol 2.5 MCG capsule Commonly known as: HECTOROL Take 2.5 mcg by mouth 3 (three) times a week.   feeding supplement (PRO-STAT SUGAR FREE 64) Liqd Take 30 mLs by mouth 2 (two) times daily.   heparin 1000 unit/mL Soln injection 1,000 Units by Dialysis route one time in dialysis.   hydrocortisone cream 1 % Apply topically 4 (four) times daily.  isosorbide mononitrate 60 MG 24 hr tablet Commonly known as: IMDUR TAKE 1 TABLET BY MOUTH  DAILY ; TAKE 1 TABLET ON  SUN, MON, WED, FRI MORNINGS AND 1 TABLET ON TUES, THU,  SAT AFTER DIALYSIS What changed: See the new instructions.   Lidocaine 4 % Ptch Apply 1 patch topically daily as needed (Mild pain).   lidocaine-prilocaine cream Commonly known as: EMLA Apply 1 application topically See admin instructions. Apply small amount to access site (AVF) one hour before dialysis, cover with occlusive dressing (saran wrap)   multivitamin with minerals tablet Take 1 tablet by mouth at bedtime.   ondansetron 4 MG tablet Commonly known as: ZOFRAN Take 1 tablet (4 mg total) by mouth every 8 (eight) hours as needed for nausea or vomiting.   polyethylene glycol 17 g packet Commonly known as: MIRALAX / GLYCOLAX Take 17 g by mouth daily as needed for mild constipation.   polyvinyl alcohol 1.4 % ophthalmic solution Commonly known as: LIQUIFILM TEARS Place 1-2 drops into both eyes  daily as needed for dry eyes.   pravastatin 40 MG tablet Commonly known as: PRAVACHOL TAKE 1 TABLET BY MOUTH  EVERY EVENING What changed: when to take this   sevelamer carbonate 800 MG tablet Commonly known as: RENVELA Take 1,600 mg by mouth 3 (three) times daily with meals.   vitamin C 500 MG tablet Commonly known as: ASCORBIC ACID Take 500 mg by mouth daily.   Vitamin D (Ergocalciferol) 1.25 MG (50000 UNIT) Caps capsule Commonly known as: DRISDOL Take 1 capsule (50,000 Units total) by mouth every Thursday.       Allergies  Allergen Reactions  . Clonidine Derivatives Palpitations and Other (See Comments)    VERY SEDATED  . Adhesive [Tape] Other (See Comments)    Tears skin up  . Codeine Nausea Only  . Nickel Rash  . Sulfa Antibiotics Other (See Comments)    Reaction unknown Did not feel well when taken Reaction unknown     Procedures /Studies:   CT ABDOMEN PELVIS WO CONTRAST  Result Date: 08/31/2019 CLINICAL DATA:  Acute on chronic back pain for several days, emesis, short of breath EXAM: CT ABDOMEN AND PELVIS WITHOUT CONTRAST TECHNIQUE: Multidetector CT imaging of the abdomen and pelvis was performed following the standard protocol without IV contrast. COMPARISON:  12/13/2018 FINDINGS: Lower chest: Hypoventilatory changes are seen at the lung bases. There is chronic scarring at the left lung base. Diffuse atherosclerosis of the aorta and coronary vessels again noted. Extensive calcification of the aortic and mitral valves. Pacemaker unchanged. Hepatobiliary: Gallbladder sludge is identified without cholecystitis. The liver is unremarkable. Pancreas: Unremarkable. No pancreatic ductal dilatation or surrounding inflammatory changes. Spleen: Normal in size without focal abnormality. Adrenals/Urinary Tract: Progressive bilateral renal cortical atrophy consistent with end-stage renal disease. Stable cysts right kidney. No urinary tract calculi or obstruction. Bladder is  unremarkable. The adrenals are unremarkable. Stomach/Bowel: There is circumferential wall thickening of the mid sigmoid colon, reference image 64. Mild pericolonic fat stranding consistent with acute colitis or diverticulitis. No bowel obstruction or ileus. The appendix is surgically absent. Vascular/Lymphatic: There is severe atherosclerosis throughout the aorta and its branches. No pathologic adenopathy. Reproductive: Uterus is markedly atrophic.  No adnexal masses. Other: No free fluid or free gas. Diffuse atrophy of the abdominal wall musculature unchanged. Musculoskeletal: Chronic T10 compression deformity with previous vertebral augmentation. There is an age-indeterminate T12 compression fracture, new since prior CT. Less than 25% loss of height. No significant paraspinal hematoma. No additional  fractures. Postsurgical changes right hip. Reconstructed images demonstrate no additional findings. IMPRESSION: 1. Circumferential wall thickening of the mid sigmoid colon with mild pericolonic fat stranding, consistent with acute uncomplicated diverticulitis or colitis. 2. New T12 compression deformity, with less than 25% loss of height. Based on appearance, this could reflect an acute or subacute fracture. 3. Sequela of end-stage renal disease. 4. Aortic Atherosclerosis (ICD10-I70.0). Severe coronary artery atherosclerosis. Electronically Signed   By: Randa Ngo M.D.   On: 08/31/2019 19:37   DG Thoracic Spine W/Swimmers  Result Date: 09/10/2019 CLINICAL DATA:  Pain after fall.  Recent kyphoplasty. EXAM: THORACIC SPINE - 3 VIEWS COMPARISON:  None. FINDINGS: Patient is status post kyphoplasty at T12 and T9. Anterior wedging of L1, mild, is unchanged since the August 31, 2019 CT scan. Mild anterior wedging of T10 and T11 is also unchanged since the comparison CT scan. Mild anterior wedging of T8 is stable since February 2021. No other fractures are identified. Evaluation of the upper thoracic spine is somewhat  limited. IMPRESSION: The patient is status post kyphoplasties at T9 and T12. Anterior wedging at several levels is stable since comparison studies as above. No acute fractures or malalignment identified. Dense calcified atherosclerosis in the abdominal aorta. Electronically Signed   By: Dorise Bullion III M.D   On: 09/10/2019 09:25   DG Lumbar Spine Complete  Result Date: 09/10/2019 CLINICAL DATA:  Recent fall, post kyphoplasty. EXAM: LUMBAR SPINE - COMPLETE 4+ VIEW COMPARISON:  07/04/2019 FINDINGS: Interval T11 kyphoplasty. Osteopenia. Spinal degenerative changes. No signs of acute fracture. No subluxation. Extensive vascular calcifications project anterior to the abdominal aorta as before. IMPRESSION: Interval T11 kyphoplasty. No signs of acute fracture. Osteopenia and degenerative changes. Electronically Signed   By: Zetta Bills M.D.   On: 09/10/2019 09:26   CT Head Wo Contrast  Result Date: 09/14/2019 CLINICAL DATA:  Altered mental status. EXAM: CT HEAD WITHOUT CONTRAST TECHNIQUE: Contiguous axial images were obtained from the base of the skull through the vertex without intravenous contrast. COMPARISON:  September 09, 2019 FINDINGS: Brain: There is mild cerebral atrophy with widening of the extra-axial spaces and ventricular dilatation. There are areas of decreased attenuation within the white matter tracts of the supratentorial brain, consistent with microvascular disease changes. Small chronic bilateral basal ganglia lacunar infarcts are seen. Vascular: No hyperdense vessel or unexpected calcification. Skull: Normal. Negative for fracture or focal lesion. Sinuses/Orbits: No acute finding. Other: None. IMPRESSION: 1. Generalized cerebral atrophy. 2. No acute intracranial abnormality. Electronically Signed   By: Virgina Norfolk M.D.   On: 09/14/2019 19:23   CT Head Wo Contrast  Result Date: 09/09/2019 CLINICAL DATA:  [place] from bed EXAM: CT HEAD WITHOUT CONTRAST TECHNIQUE: Contiguous axial images  were obtained from the base of the skull through the vertex without intravenous contrast. COMPARISON:  07/28/2017 FINDINGS: Brain: Chronic hypodensities are seen throughout the periventricular white matter and bilateral basal ganglia, consistent with chronic small vessel ischemic changes. Stable diffuse cerebral atrophy. No signs of acute infarct or hemorrhage. The lateral ventricles and remaining midline structures are unremarkable. No acute extra-axial fluid collections. No mass effect. Vascular: No hyperdense vessel or unexpected calcification. Skull: Normal. Negative for fracture or focal lesion. Sinuses/Orbits: No acute finding. Other: None. IMPRESSION: 1. No acute intracranial process. 2. Chronic small vessel ischemic changes and cerebral atrophy. Electronically Signed   By: Randa Ngo M.D.   On: 09/09/2019 21:44   CT Cervical Spine Wo Contrast  Result Date: 09/09/2019 CLINICAL DATA:  [place] from  bed, chronic pain EXAM: CT CERVICAL SPINE WITHOUT CONTRAST TECHNIQUE: Multidetector CT imaging of the cervical spine was performed without intravenous contrast. Multiplanar CT image reconstructions were also generated. COMPARISON:  10/04/2018 FINDINGS: Alignment: Alignment is grossly anatomic. Skull base and vertebrae: No acute displaced fracture. Soft tissues and spinal canal: No prevertebral fluid or swelling. No visible canal hematoma. Disc levels: Mild diffuse cervical spondylosis with disc space narrowing and mild circumferential osteophyte formation at all levels. Changes are most pronounced at C5-6 and C6-7. No significant bony encroachment upon the central canal. There is bony fusion of the left C3/C4 facet, with uncovertebral hypertrophy and facet hypertrophy resulting in severe left neural foraminal narrowing. Upper chest: Airway is patent.  Lung apices are clear. Other: Reconstructed images demonstrate no additional findings. IMPRESSION: 1. No acute displaced fracture. 2. Multilevel cervical  spondylosis. Electronically Signed   By: Randa Ngo M.D.   On: 09/09/2019 21:50   DG Chest Port 1 View  Result Date: 09/14/2019 CLINICAL DATA:  Altered mental status. The fell and hit head on 04/24, followed by a normal head CT. EXAM: PORTABLE CHEST 1 VIEW COMPARISON:  08/31/2019 FINDINGS: Patient has LEFT-sided transvenous pacemaker with leads to the RIGHT atrium and RIGHT ventricle. The heart is enlarged. There is increased opacity at the LEFT lung base, consistent with atelectasis or infiltrate. Suspect LEFT pleural effusion. The increased prominence of interstitial markings raises a question of interstitial edema. IMPRESSION: 1. Cardiomegaly and possible interstitial edema. 2. Increased LEFT lower lobe atelectasis or infiltrate. 3. Probable LEFT pleural effusion. Electronically Signed   By: Nolon Nations M.D.   On: 09/14/2019 19:18   DG Chest Port 1 View  Result Date: 08/31/2019 CLINICAL DATA:  Shortness of breath EXAM: PORTABLE CHEST 1 VIEW COMPARISON:  August 16, 2019 and April 13, 2019 FINDINGS: There is suspected scarring in the left base. There may also be epicardial fat prominence in this area. The appearance is stable. There is no apparent edema or airspace opacity. Heart is upper normal in size with pulmonary vascularity normal. Pacemaker lead tips are attached to the right atrium and right ventricle. No adenopathy. There is aortic atherosclerosis. Bones are osteoporotic. Patient is undergone prior kyphoplasty procedure in the lower thoracic region IMPRESSION: Suspect a degree of scarring in the lateral left base. No edema or airspace opacity. Stable cardiac silhouette. Pacemaker leads attached to right atrium and right ventricle. Bones osteoporotic. Aortic Atherosclerosis (ICD10-I70.0). Electronically Signed   By: Lowella Grip III M.D.   On: 08/31/2019 15:29   IR KYPHO THORACIC WITH BONE BIOPSY  Result Date: 09/06/2019 INDICATION: Valerie Terrell is a 86 year old. Female with a  past medical history of hypertension, hyperlipidemia, sick sinus syndrome, HF, paroxysmal atrial fibrillation not on chronic anticoagulation, [place] 2016, CVA 2017, ESRD on HD, nephrolithiasis, osteopenia, and multiple fractures (thoracic compression fractures, right hip fracture, metatarsal fracture). She [facility] ED [facility]/2019 with complaints of back and abdominal pain. In ED, CT abdomen/pelvis revealed diverticulitis and a new T11 compression fracture. EXAM: Fluoroscopy guided T11 kyphoplasty and biopsy COMPARISON:  MRI of the thoracic spine July 24, 2018; CT of the abdomen August 31, 2019 MEDICATIONS: As antibiotic prophylaxis, Ancef 2 gm IV was ordered pre-procedure and administered intravenously within 1 hour of incision. ANESTHESIA/SEDATION: Moderate (conscious) sedation was employed during this procedure. A total of Versed 1.5 mg and Fentanyl 50 mcg was administered intravenously. Moderate Sedation Time: 45 minutes. The patient's level of consciousness and vital signs were monitored continuously by radiology nursing throughout  the procedure under my direct supervision. FLUOROSCOPY TIME:  Fluoroscopy Time: 12 minutes 6 seconds (284 mGy) COMPLICATIONS: None immediate. PROCEDURE: Following a full explanation of the procedure along with the potential associated complications, an informed witnessed consent was obtained. The patient was placed in prone position on the angiography table. The thoracic spine region was prepped and draped in a sterile fashion. Under fluoroscopy, the the T11 vertebral body was delineated and the skin area was marked. The skin was infiltrated with a 1% Lidocaine approximately 3.5 cm lateral to the spinous process projection on the right. Using a 22-gauge spinal needle, the soft issue and the peripedicular space and periosteum were infiltrated with Bupivacaine 0.25%. A skin incision was made at the access site. Subsequently, an 11-gauge Kyphon trocar was inserted under fluoroscopic  guidance until contact with the pedicle was obtained. The trocar was advanced through the pedicle into the vertebral body with light hammer tapping. The diamond mandrill was removed and one core biopsy was obtained. The skin was infiltrated with a 1% Lidocaine approximately 3.5 cm lateral to the spinous process projection on the left. Using a 22-gauge spinal needle, the soft issue and the peripedicular space and periosteum were infiltrated with Bupivacaine 0.25%. A skin incision was made at the access site. Subsequently, an 11-gauge Kyphon trocar was inserted under fluoroscopic guidance until contact with the pedicle was obtained. The trocar was advanced through the pedicle into the vertebral body with light hammer tapping. The diamond mandrill was removed. A bone drill was coaxially advanced within the anterior third of the vertebral body and then exchanged for inflatable Kyphon balloons. These were centered within the mid-aspect of the vertebral body. The balloons were inflated to create a void to serve as a repository for the bone cement. Both balloons were deflated and through both cannulas, under continuous fluoroscopy guidance in the AP and lateral views. The vertebral body was filled with previously mixed polymethyl-methacrylate (PMMA) added to barium for opacification. Both cannulas were later removed. The access sites were cleaned and covered with a sterile bandage. IMPRESSION: Successful fluoroscopy-guided bilateral transpedicular approach for T11 vertebral body core bone biopsy and kyphoplasty for treatment of osteoporotic fragility fracture. Bone sample obtained was sent for pathology analysis. Electronically Signed   By: Pedro Earls M.D.   On: 09/06/2019 16:03   CT Maxillofacial Wo Contrast  Result Date: 09/09/2019 CLINICAL DATA:  [place] from bed EXAM: CT MAXILLOFACIAL WITHOUT CONTRAST TECHNIQUE: Multidetector CT imaging of the maxillofacial structures was performed. Multiplanar CT  image reconstructions were also generated. COMPARISON:  None. FINDINGS: Osseous: No fracture or mandibular dislocation. No destructive process. Orbits: Negative. No traumatic or inflammatory finding. Sinuses: Clear. Soft tissues: There is mucosal thickening within the left nasal passage. Remaining soft tissues are unremarkable. Limited intracranial: No significant or unexpected finding. IMPRESSION: 1. Mucosal thickening left nasal passage. 2. No acute displaced fracture. Electronically Signed   By: Randa Ngo M.D.   On: 09/09/2019 21:46     Subjective:   Patient was seen and examined 09/18/2019, 11:49 AM Patient stable today. No acute distress.  No issues overnight Stable for discharge.  Discharge Exam:    Vitals:   09/18/19 1010 09/18/19 1021 09/18/19 1030 09/18/19 1100  BP: (!) 108/48 (!) 118/48 112/80 118/80  Pulse: 70 72 84 84  Resp: 16 16 16 16   Temp: 98 F (36.7 C)     TempSrc: Oral     SpO2:  100%    Weight: 52 kg  Height:        General: Pt lying comfortably in bed & appears in no obvious distress. Cardiovascular: S1 & S2 heard, RRR, S1/S2 +. No murmurs, rubs, gallops or clicks. No JVD or pedal edema. Respiratory: Clear to auscultation without wheezing, rhonchi or crackles. No increased work of breathing. Abdominal:  Non-distended, non-tender & soft. No organomegaly or masses appreciated. Normal bowel sounds heard. CNS: Alert and oriented. No focal deficits. Extremities: no edema, no cyanosis    The results of significant diagnostics from this hospitalization (including imaging, microbiology, ancillary and laboratory) are listed below for reference.      Microbiology:   Recent Results (from the past 240 hour(s))  Respiratory Panel by RT PCR (Flu A&B, Covid) - Nasopharyngeal Swab     Status: None   Collection Time: 09/14/19  6:46 PM   Specimen: Nasopharyngeal Swab  Result Value Ref Range Status   SARS Coronavirus 2 by RT PCR NEGATIVE NEGATIVE Final     Comment: (NOTE) SARS-CoV-2 target nucleic acids are NOT DETECTED. The SARS-CoV-2 RNA is generally detectable in upper respiratoy specimens during the acute phase of infection. The lowest concentration of SARS-CoV-2 viral copies this assay can detect is 131 copies/mL. A negative result does not preclude SARS-Cov-2 infection and should not be used as the sole basis for treatment or other patient management decisions. A negative result may occur with  improper specimen collection/handling, submission of specimen other than nasopharyngeal swab, presence of viral mutation(s) within the areas targeted by this assay, and inadequate number of viral copies (<131 copies/mL). A negative result must be combined with clinical observations, patient history, and epidemiological information. The expected result is Negative. Fact Sheet for Patients:  PinkCheek.be Fact Sheet for Healthcare Providers:  GravelBags.it This test is not yet ap proved or cleared by the [place] FDA and  has been authorized for detection and/or diagnosis of SARS-CoV-2 by FDA under an Emergency Use Authorization (EUA). This EUA will remain  in effect (meaning this test can be used) for the duration of the COVID-19 declaration under Section 564(b)(1) of the Act, 21 U.S.C. section 360bbb-3(b)(1), unless the authorization is terminated or revoked sooner.    Influenza A by PCR NEGATIVE NEGATIVE Final   Influenza B by PCR NEGATIVE NEGATIVE Final    Comment: (NOTE) The Xpert Xpress SARS-CoV-2/FLU/RSV assay is intended as an aid in  the diagnosis of influenza from Nasopharyngeal swab specimens and  should not be used as a sole basis for treatment. Nasal washings and  aspirates are unacceptable for Xpert Xpress SARS-CoV-2/FLU/RSV  testing. Fact Sheet for Patients: PinkCheek.be Fact Sheet for Healthcare  Providers: GravelBags.it This test is not yet approved or cleared by the [place] FDA and  has been authorized for detection and/or diagnosis of SARS-CoV-2 by  FDA under an Emergency Use Authorization (EUA). This EUA will remain  in effect (meaning this test can be used) for the duration of the  Covid-19 declaration under Section 564(b)(1) of the Act, 21  U.S.C. section 360bbb-3(b)(1), unless the authorization is  terminated or revoked. Performed at [facility], [place] [address], [place], [place] [address]   Culture, blood (Routine X 2) w Reflex to ID Panel     Status: None (Preliminary result)   Collection Time: 09/15/19  6:42 AM   Specimen: BLOOD LEFT HAND  Result Value Ref Range Status   Specimen Description BLOOD LEFT HAND  Final   Special Requests   Final    BOTTLES DRAWN AEROBIC  ONLY Blood Culture results may not be optimal due to an inadequate volume of blood received in culture bottles   Culture   Final    NO GROWTH 3 DAYS Performed at [facility], [place] [address], [place], [place] [address]    Report Status PENDING  Incomplete  Culture, blood (Routine X 2) w Reflex to ID Panel     Status: None (Preliminary result)   Collection Time: 09/15/19 10:00 AM   Specimen: BLOOD LEFT HAND  Result Value Ref Range Status   Specimen Description BLOOD LEFT HAND  Final   Special Requests   Final    BOTTLES DRAWN AEROBIC AND ANAEROBIC Blood Culture adequate volume   Culture   Final    NO GROWTH 3 DAYS Performed at [facility], [place] [address], [place], [place] [address]    Report Status PENDING  Incomplete     Labs:   CBC: Recent Labs  Lab 09/14/19 1624 09/15/19 0959 09/16/19 0314 09/17/19 0508 09/18/19 0354  WBC 15.4* 10.9* 10.7* 8.7 10.6*  HGB 11.8* 10.3* 10.5* 9.0* 9.4*  HCT 37.5 33.2* 32.9* 28.5* 29.8*  MCV 98.7 98.2 96.8 95.6 94.9  PLT 259 222 245 216 672   Basic Metabolic Panel: Recent Labs  Lab 09/14/19 1624  09/15/19 0959 09/16/19 0314 09/17/19 0508 09/18/19 0354  NA 140 139 137 137 136  K 3.7 3.6 4.6 3.5 3.4*  CL 97* 96* 97* 98 97*  CO2 28 30 27 29 27   GLUCOSE 92 94 71 83 80  BUN 11 15 22 10 17   CREATININE 3.11* 3.91* 4.70* 2.28* 3.74*  CALCIUM 7.9* 8.3* 8.3* 8.0* 8.1*   Liver Function Tests: Recent Labs  Lab 09/14/19 1624 09/15/19 0959 09/16/19 0314 09/17/19 0508  AST 21 18 25 20   ALT 6 9 6  <5  ALKPHOS 104 89 85 70  BILITOT 0.5 0.7 1.4* 0.7  PROT 6.1* 5.8* 5.6* 4.8*  ALBUMIN 2.3* 2.1* 2.0* 1.7*   BNP (last 3 results) Recent Labs    03/24/19 1040 08/11/19 0929 08/16/19 0758  BNP 2,088.4* 922.1* 1,660.5*   Cardiac Enzymes: No results for input(s): CKTOTAL, CKMB, CKMBINDEX, TROPONINI in the last 168 hours. CBG: Recent Labs  Lab 09/14/19 2121  GLUCAP 88   Hgb A1c No results for input(s): HGBA1C in the last 72 hours. Lipid Profile No results for input(s): CHOL, HDL, LDLCALC, TRIG, CHOLHDL, LDLDIRECT in the last 72 hours. Thyroid function studies No results for input(s): TSH, T4TOTAL, T3FREE, THYROIDAB in the last 72 hours.  Invalid input(s): FREET3 Anemia work up No results for input(s): VITAMINB12, FOLATE, FERRITIN, TIBC, IRON, RETICCTPCT in the last 72 hours. Urinalysis    Component Value Date/Time   COLORURINE YELLOW 01/20/2017 1737   APPEARANCEUR CLEAR 01/20/2017 1737   LABSPEC 1.012 01/20/2017 1737   PHURINE 5.0 01/20/2017 1737   GLUCOSEU NEGATIVE 01/20/2017 1737   HGBUR NEGATIVE 01/20/2017 1737   BILIRUBINUR NEGATIVE 01/20/2017 1737   BILIRUBINUR negative 09/24/2016 1453   KETONESUR NEGATIVE 01/20/2017 1737   PROTEINUR 30 (A) 01/20/2017 1737   UROBILINOGEN 0.2 09/24/2016 1453   NITRITE NEGATIVE 01/20/2017 1737   LEUKOCYTESUR NEGATIVE 01/20/2017 1737   Pressure Injury 08/16/19 Sacrum Medial Stage 1 -  Intact skin with non-blanchable redness of a localized area usually over a bony prominence. (Active)  08/16/19 1917  Location: Sacrum  Location  Orientation: Medial  Staging: Stage 1 -  Intact skin with non-blanchable redness of a localized area usually over a bony prominence.  Wound Description (Comments):   Present on Admission: Yes       Time coordinating discharge: Over 45 minutes  SIGNED: Deatra James, MD, FACP, [facility]. [facility],  Please use [email or web address] to Page If 7PM-7AM, please contact night-coverage [email or web address], [facility] 09/18/2019, 11:49 AM

## 2019-09-18 NOTE — Progress Notes (Signed)
[facility] Progress Note  Subjective: seen in HD, no c/o  Vitals:   09/18/19 1200 09/18/19 1230 09/18/19 1300 09/18/19 1330  BP: (!) 121/45 (!) 122/51 (!) 120/50 (!) 124/52  Pulse: 74 74 74 80  Resp: 16 16 16 16   Temp:      TempSrc:      SpO2:      Weight:      Height:        Exam:  frail elderly WF, pleasant, no distress  ox 3 w/ some assist  chest cta bilat   Cor reg no mrg   abd soft ntnd   Ext no LE edema   Dialysis: pending   Assessment/ Plan: 1. Healthcare associated pneumonia with delirium:CXR revealed L lower lobe atelectasis vs infiltrate with pleural effusion.  On ceftriaxone and appears to be doing clinically better with regards to hypoxia and delirium state. 2. ESRD:gets HD 4 d/ wk on MTTS sched. Plan HD today. Will keep on 3 days per week while here if vol status stable as inpt.  3. Hypertension/volume: Blood pressure under good control and vol stable. Up 1-2 kg by wts, no vol excess on exam.  4. Anemia: Downtrending hemoglobin and hematocrit, will redose ESA with dialysis. 5. Metabolic bone disease:Corrected calcium 9.2, phos at goal. Continue outpatient binder.  6. Diarrhea: Recent sigmoid diverticulitis, treated with antibiotics. Per primary.  7. Hx T11 compression fracture:No complaint of pain at present, narcotics on hold 8. Debility: plan is for SNF placement. She is not ready to stop dialysis at this time.     Rob Ellery Tash 09/18/2019, 2:39 PM   Recent Labs  Lab 09/17/19 0508 09/18/19 0354  K 3.5 3.4*  BUN 10 17  CREATININE 2.28* 3.74*  CALCIUM 8.0* 8.1*  HGB 9.0* 9.4*   Inpatient medications: . amiodarone  200 mg Oral Daily  . Chlorhexidine Gluconate Cloth  6 each Topical Q0600  . darbepoetin (ARANESP) injection - DIALYSIS  40 mcg Intravenous Q Mon-HD  . feeding supplement (ENSURE ENLIVE)  237 mL Oral TID BM  . heparin  5,000 Units Subcutaneous Q8H  . isosorbide mononitrate  60 mg Oral Daily  . multivitamin  1  tablet Oral QHS  . pravastatin  40 mg Oral QHS   . cefTRIAXone (ROCEPHIN)  IV Stopped (09/17/19 1304)   bisacodyl, fentaNYL (SUBLIMAZE) injection, ondansetron (ZOFRAN) IV

## 2019-09-19 NOTE — TOC Progression Note (Addendum)
Transition of Care [facility]) - Progression Note    Patient Details  Name: Valerie Terrell MRN: [id number] Date of Birth: 06/24/1932  [name] CM/SW Contact  Angelita Ingles, RN Phone Number: [phone]  09/19/2019, 10:38 AM  Clinical Narrative:   Daughter has been updated on bed offers. Daughter to speak with patient and call CM back with decision.  09/19/2019 1608 Daughter has decided on [facility]. Bed will not be available until tomorrow. Insurance Josem Kaufmann is still pending CM has faxed updated therapy notes. MD has been updated. Will continue to follow.    Expected Discharge Plan: Skilled Nursing Facility Barriers to Discharge: Ship broker, Continued Medical Work up  Expected Discharge Plan and Services Expected Discharge Plan: [place] In-house Referral: Clinical Social Work, Hospice / [place] arrangements for the past 2 months: Single Family Home Expected Discharge Date: 09/19/19                                     Social Determinants of Health (SDOH) Interventions    Readmission Risk Interventions Readmission Risk Prevention Plan 10/06/2018  Transportation Screening Complete  Medication Review Press photographer) Complete  PCP or Specialist appointment within 3-5 days of discharge Complete  HRI or [place] Complete  SW Recovery Care/Counseling Consult Complete  Palliative Care Screening Not [place] Complete  Some recent data might be hidden

## 2019-09-19 NOTE — Progress Notes (Signed)
Physical Therapy Treatment Patient Details Name: Valerie Terrell MRN: [id number] DOB: 07-24-1932 Today's Date: 09/19/2019    History of Present Illness Valerie Terrell is a 86 y.o. F with hx CHF, chronic hypoxic respiratory failure on 2L, ESRD on HD TThS, HTN, pAF no longer on AC, NICM and SSS with pacer and hx stroke who presented from HD with hypoxia, decreased sensorium, confusion and generalized weakness for 1-2 days.  Found to have HCAP.    PT Comments    Patient progressing with mobility including supine to sit, sitting balance, sit to stand and even able to ambulate some with chair behind her in the room.  She seemed more oriented and following through on commands as well.  Feel she should continue to progress with skilled PT in the acute setting and follow up SNF level rehab at d/c.    Follow Up Recommendations  SNF;Supervision/Assistance - 24 hour     Equipment Recommendations  Wheelchair (measurements PT);Wheelchair cushion (measurements PT)    Recommendations for Other Services       Precautions / Restrictions Precautions Precautions: Fall Precaution Comments: O2 dependent    Mobility  Bed Mobility Overal bed mobility: Needs Assistance Bed Mobility: Supine to Sit     Supine to sit: HOB elevated;Mod assist     General bed mobility comments: able to move legs off bed, assist for lifting trunk and scooting; initially leaning to L  Transfers Overall transfer level: Needs assistance Equipment used: Rolling walker (2 wheeled) Transfers: Sit to/from Stand Sit to Stand: Mod assist;+2 physical assistance;Min assist         General transfer comment: inital sit to stand with min A of 2 for safety and cues for hand placement, with progressing repitition she needed a little more lifting help  Ambulation/Gait Ambulation/Gait assistance: Mod assist;+2 safety/equipment Gait Distance (Feet): 5 Feet Assistive device: Rolling walker (2 wheeled) Gait Pattern/deviations:  Step-to pattern;Shuffle;Trunk flexed;Decreased stride length     General Gait Details: assist for balance & safety, cues for posture and proximity to walker, chair placed behind her   Stairs             Wheelchair Mobility    Modified Rankin (Stroke Patients Only)       Balance Overall balance assessment: Needs assistance Sitting-balance support: Feet supported Sitting balance-Leahy Scale: Poor Sitting balance - Comments: initially leaning to L in sitting, able to come up over time to sit more centered, but with kyphoscoliotic posture and cues for lifting trunk with UE support and hold with scap retractions x several seconds over about 5 reps; work with OT for combing hair, etc at EOB close S   Standing balance support: Bilateral upper extremity supported Standing balance-Leahy Scale: Poor Standing balance comment: min to mod A with UE support                            Cognition Arousal/Alertness: Awake/alert Behavior During Therapy: WFL for tasks assessed/performed Overall Cognitive Status: Within Functional Limits for tasks assessed                                 General Comments: Today seems alert and motivated for mobility and able to attend to task with min cues.  She reported nursing had helped her up to [facility] and she took steps with assist      Exercises  General Comments General comments (skin integrity, edema, etc.): On O2 throughout with min c/o SOB, HR stable      Pertinent Vitals/Pain Pain Assessment: No/denies pain    Home Living                      Prior Function            PT Goals (current goals can now be found in the care plan section) Progress towards PT goals: Progressing toward goals    Frequency    Min 3X/week      PT Plan Current plan remains appropriate    Co-evaluation PT/OT/SLP Co-Evaluation/Treatment: Yes Reason for Co-Treatment: For patient/therapist safety;To address  functional/ADL transfers PT goals addressed during session: Mobility/safety with mobility;Balance;Proper use of DME OT goals addressed during session: Strengthening/ROM;ADL's and self-care      AM-PAC PT "6 Clicks" Mobility   Outcome Measure  Help needed turning from your back to your side while in a flat bed without using bedrails?: A Lot Help needed moving from lying on your back to sitting on the side of a flat bed without using bedrails?: A Lot Help needed moving to and from a bed to a chair (including a wheelchair)?: A Lot Help needed standing up from a chair using your arms (e.g., wheelchair or bedside chair)?: A Lot Help needed to walk in hospital room?: A Lot Help needed climbing 3-5 steps with a railing? : Total 6 Click Score: 11    End of Session Equipment Utilized During Treatment: Gait belt;Oxygen Activity Tolerance: Patient tolerated treatment well Patient left: in chair;with call bell/phone within reach;with chair alarm set   PT Visit Diagnosis: Unsteadiness on feet (R26.81);Muscle weakness (generalized) (M62.81);Difficulty in walking, not elsewhere classified (R26.2) Pain - part of body: (back)     Time: 4847-2072 PT Time Calculation (min) (ACUTE ONLY): 32 min  Charges:  $Therapeutic Activity: 8-22 mins                     Valerie Terrell, [place] [facility] [phone] 09/19/2019    Valerie Terrell 09/19/2019, 12:40 PM

## 2019-09-19 NOTE — Discharge Summary (Signed)
Physician Discharge Summary [facility]    Patient: Valerie Terrell                   Admit date: 09/14/2019   DOB: 13-Apr-1933             Discharge date:09/19/2019/10:26 AM [id number]:[id number]                          PCP: Tammi Sou, MD  Disposition: SNF  Recommendations for Outpatient Follow-up:   . Follow up: as needed  Discharge Condition: Stable   Code Status:   Code Status: DNR  Diet recommendation: Renal diet   Discharge Diagnoses:    Principal Problem:   HAP (hospital-acquired pneumonia) Active Problems:   PAF (paroxysmal atrial fibrillation) ([place])   Essential hypertension   Hyperlipidemia   History of CVA (cerebrovascular accident)   Cardiac pacemaker   ESRD on dialysis [facility])   Acute respiratory failure with hypoxia ([place])   Hypoxia   Moderate protein-calorie malnutrition ([place])   Compression fracture of T11 vertebra ([place])   Acute encephalopathy   Subjective:   Patient was seen and examined 09/19/2019, 10:26 AM Status post hemodialysis 09/18/2019 Patient stable today. No acute distress.  No issues overnight Stable for discharge.  Patient remained back yesterday -SW still looking for bed availability   History of Present Illness/ Hospital Course Valerie Terrell Summary:   Brief Narrative:  Valerie Terrell a 86 y.o.Fwith hx CHF, chronic hypoxic respiratory failure on 2L, ESRD on HD TThS, HTN, pAF no longer on AC, NICM and SSS with pacer and hx stroke who presented from HD with hypoxia, decreased sensorium, confusion and generalized weakness for 1-2 days.  Patient recently admitted for a week for confusion, discharged 1 week ago. Diagnosed with acute diverticulitis complicated by L27 compression fracture and delirium. Got kyphoplasty, was discharged to home, was doing better until a few days ago. On day of admission, HHPT noted hypoxia, but patient was well enough to go to HD. There she was hypoxic again, too sleepy on HD to tolerate, and was  sent to ER.  In the ER, WBC 15K. CXR showed new left effusion and infiltrate.  5/1: Patient receiving hemodialysis today. Awaiting palliative discussion with family members. No acute overnight events noted. No concerns or complaints this am.  5/2: Patient appears to be doing well this morning with no symptomatic complaints or concerns.  Plans for further hemodialysis in a.m.  CSW working on placement to SNF which is still pending.  She is not agreeable to hospice at this time.  Potential discharge in next 24 hours after hemodialysis tomorrow if SNF bed available.  09/18/2019 -patient was seen and examined, daughter present at bedside, has agreed to discharge to SNF.  No complaints overnight.  Repeated SARS-CoV-2 continues to be negative  Detailed discharge summary   Community acquired pneumonia left lower lobe Recent Abx, but mild disease, no history drug resistant infections. -Completed 5 days of IV antibiotics Rocephin. Afebrile normotensive, cultures negative to date   ESRD on HD MTThS -Appreciate nephrology for hemodialysis with further hemodialysis Today 09/18/2019  Anemia-downtrending -No overt bleeding noted -Nephrology planning to reduce ESA with dialysis  Chronic systolic and diastolic CHF Chronic hypoxic respiratory failure Non-ischemic cardiomyopathy SSS with pacer  Paroxysmal atrial fibrillation Not on AC due to recurrent falls. -Continue amiodarone  Hypertension-controlled Cerebrovascular disease secondary prevention BPnormal -ContinueImdur, statin  Stage I Pressure injury sacrum, POA -Continue to monitor  Failure  to thrive As evidenced by BMI 22, chronic renal and cardiac failure, poor PO intake, pressure injury. -Palliative discussion noted on 5/1 as well as 5/2, but patient wants to continue on dialysis and full scope of care. -Planning for discharge to SNF     Code Status:DNR Family Communication: Discussed with daughter on  phone Disposition Plan: Plan to discharge to SNF     Consultants:  Nephrology  Palliative Care  Procedures:  Hemodialysis, last 1 today 09/18/2019    Nutritional status:  Nutrition Problem: Moderate Malnutrition Etiology: chronic illness(ESRD on HD) Signs/Symptoms: moderate fat depletion, moderate muscle depletion Interventions: Ensure Enlive (each supplement provides 350kcal and 20 grams of protein), MVI   Discharge Instructions:   Discharge Instructions    (HEART FAILURE PATIENTS) Call MD:  Anytime you have any of the following symptoms: 1) 3 pound weight gain in 24 hours or 5 pounds in 1 week 2) shortness of breath, with or without a dry hacking cough 3) swelling in the hands, feet or stomach 4) if you have to sleep on extra pillows at night in order to breathe.   Complete by: As directed    Activity as tolerated - No restrictions   Complete by: As directed    Call MD for:  difficulty breathing, headache or visual disturbances   Complete by: As directed    Call MD for:  persistant nausea and vomiting   Complete by: As directed    Call MD for:  redness, tenderness, or signs of infection (pain, swelling, redness, odor or green/yellow discharge around incision site)   Complete by: As directed    Call MD for:  temperature >100.4   Complete by: As directed    Diet - low sodium heart healthy   Complete by: As directed    Discharge instructions   Complete by: As directed    Follow-up with PCP in 1 to 2 weeks   Increase activity slowly   Complete by: As directed        Medication List    STOP taking these medications   melatonin 3 MG Tabs tablet   naloxone 0.4 MG/ML injection Commonly known as: NARCAN   Oxycodone HCl 10 MG Tabs     TAKE these medications   acetaminophen 500 MG tablet Commonly known as: TYLENOL Take 500-1,000 mg 3 (three) times daily as needed by mouth for moderate pain.   ACT Dry Mouth Lozg Use as directed 1 lozenge in the mouth or  throat daily as needed (for dry mouth).   amiodarone 200 MG tablet Commonly known as: PACERONE TAKE 1 TABLET BY MOUTH  DAILY   aspirin EC 325 MG tablet Take 325 mg by mouth every evening.   b complex-vitamin c-folic acid 0.8 MG Tabs tablet Take 1 tablet by mouth See admin instructions. Take one tablet by mouth on Tuesday, Thursday, Saturday - after dialysis   calcitonin (salmon) 200 UNIT/ACT nasal spray Commonly known as: MIACALCIN/FORTICAL Place 1 spray into alternate nostrils daily.   D-5000 125 MCG (5000 UT) Tabs Generic drug: Cholecalciferol Take 1 tablet by mouth daily.   Denta 5000 Plus 1.1 % Crea dental cream Generic drug: sodium fluoride Place 1 application onto teeth at bedtime.   docusate sodium 100 MG capsule Commonly known as: COLACE Take 100 mg by mouth daily as needed for mild constipation.   doxercalciferol 2.5 MCG capsule Commonly known as: HECTOROL Take 2.5 mcg by mouth 3 (three) times a week.   feeding supplement (PRO-STAT SUGAR FREE  64) Liqd Take 30 mLs by mouth 2 (two) times daily.   heparin 1000 unit/mL Soln injection 1,000 Units by Dialysis route one time in dialysis.   hydrocortisone cream 1 % Apply topically 4 (four) times daily.   isosorbide mononitrate 60 MG 24 hr tablet Commonly known as: IMDUR TAKE 1 TABLET BY MOUTH  DAILY ; TAKE 1 TABLET ON  SUN, MON, WED, FRI MORNINGS AND 1 TABLET ON TUES, THU,  SAT AFTER DIALYSIS What changed: See the new instructions.   Lidocaine 4 % Ptch Apply 1 patch topically daily as needed (Mild pain).   lidocaine-prilocaine cream Commonly known as: EMLA Apply 1 application topically See admin instructions. Apply small amount to access site (AVF) one hour before dialysis, cover with occlusive dressing (saran wrap)   multivitamin with minerals tablet Take 1 tablet by mouth at bedtime.   ondansetron 4 MG tablet Commonly known as: ZOFRAN Take 1 tablet (4 mg total) by mouth every 8 (eight) hours as needed for  nausea or vomiting.   polyethylene glycol 17 g packet Commonly known as: MIRALAX / GLYCOLAX Take 17 g by mouth daily as needed for mild constipation.   polyvinyl alcohol 1.4 % ophthalmic solution Commonly known as: LIQUIFILM TEARS Place 1-2 drops into both eyes daily as needed for dry eyes.   pravastatin 40 MG tablet Commonly known as: PRAVACHOL TAKE 1 TABLET BY MOUTH  EVERY EVENING What changed: when to take this   sevelamer carbonate 800 MG tablet Commonly known as: RENVELA Take 1,600 mg by mouth 3 (three) times daily with meals.   vitamin C 500 MG tablet Commonly known as: ASCORBIC ACID Take 500 mg by mouth daily.   Vitamin D (Ergocalciferol) 1.25 MG (50000 UNIT) Caps capsule Commonly known as: DRISDOL Take 1 capsule (50,000 Units total) by mouth every Thursday.       Allergies  Allergen Reactions  . Clonidine Derivatives Palpitations and Other (See Comments)    VERY SEDATED  . Adhesive [Tape] Other (See Comments)    Tears skin up  . Codeine Nausea Only  . Nickel Rash  . Sulfa Antibiotics Other (See Comments)    Reaction unknown Did not feel well when taken Reaction unknown     Procedures /Studies:   CT ABDOMEN PELVIS WO CONTRAST  Result Date: 08/31/2019 CLINICAL DATA:  Acute on chronic back pain for several days, emesis, short of breath EXAM: CT ABDOMEN AND PELVIS WITHOUT CONTRAST TECHNIQUE: Multidetector CT imaging of the abdomen and pelvis was performed following the standard protocol without IV contrast. COMPARISON:  12/13/2018 FINDINGS: Lower chest: Hypoventilatory changes are seen at the lung bases. There is chronic scarring at the left lung base. Diffuse atherosclerosis of the aorta and coronary vessels again noted. Extensive calcification of the aortic and mitral valves. Pacemaker unchanged. Hepatobiliary: Gallbladder sludge is identified without cholecystitis. The liver is unremarkable. Pancreas: Unremarkable. No pancreatic ductal dilatation or surrounding  inflammatory changes. Spleen: Normal in size without focal abnormality. Adrenals/Urinary Tract: Progressive bilateral renal cortical atrophy consistent with end-stage renal disease. Stable cysts right kidney. No urinary tract calculi or obstruction. Bladder is unremarkable. The adrenals are unremarkable. Stomach/Bowel: There is circumferential wall thickening of the mid sigmoid colon, reference image 64. Mild pericolonic fat stranding consistent with acute colitis or diverticulitis. No bowel obstruction or ileus. The appendix is surgically absent. Vascular/Lymphatic: There is severe atherosclerosis throughout the aorta and its branches. No pathologic adenopathy. Reproductive: Uterus is markedly atrophic.  No adnexal masses. Other: No free fluid or free  gas. Diffuse atrophy of the abdominal wall musculature unchanged. Musculoskeletal: Chronic T10 compression deformity with previous vertebral augmentation. There is an age-indeterminate T12 compression fracture, new since prior CT. Less than 25% loss of height. No significant paraspinal hematoma. No additional fractures. Postsurgical changes right hip. Reconstructed images demonstrate no additional findings. IMPRESSION: 1. Circumferential wall thickening of the mid sigmoid colon with mild pericolonic fat stranding, consistent with acute uncomplicated diverticulitis or colitis. 2. New T12 compression deformity, with less than 25% loss of height. Based on appearance, this could reflect an acute or subacute fracture. 3. Sequela of end-stage renal disease. 4. Aortic Atherosclerosis (ICD10-I70.0). Severe coronary artery atherosclerosis. Electronically Signed   By: Randa Ngo M.D.   On: 08/31/2019 19:37   DG Thoracic Spine W/Swimmers  Result Date: 09/10/2019 CLINICAL DATA:  Pain after fall.  Recent kyphoplasty. EXAM: THORACIC SPINE - 3 VIEWS COMPARISON:  None. FINDINGS: Patient is status post kyphoplasty at T12 and T9. Anterior wedging of L1, mild, is unchanged since  the August 31, 2019 CT scan. Mild anterior wedging of T10 and T11 is also unchanged since the comparison CT scan. Mild anterior wedging of T8 is stable since February 2021. No other fractures are identified. Evaluation of the upper thoracic spine is somewhat limited. IMPRESSION: The patient is status post kyphoplasties at T9 and T12. Anterior wedging at several levels is stable since comparison studies as above. No acute fractures or malalignment identified. Dense calcified atherosclerosis in the abdominal aorta. Electronically Signed   By: Dorise Bullion III M.D   On: 09/10/2019 09:25   DG Lumbar Spine Complete  Result Date: 09/10/2019 CLINICAL DATA:  Recent fall, post kyphoplasty. EXAM: LUMBAR SPINE - COMPLETE 4+ VIEW COMPARISON:  07/04/2019 FINDINGS: Interval T11 kyphoplasty. Osteopenia. Spinal degenerative changes. No signs of acute fracture. No subluxation. Extensive vascular calcifications project anterior to the abdominal aorta as before. IMPRESSION: Interval T11 kyphoplasty. No signs of acute fracture. Osteopenia and degenerative changes. Electronically Signed   By: Zetta Bills M.D.   On: 09/10/2019 09:26   CT Head Wo Contrast  Result Date: 09/14/2019 CLINICAL DATA:  Altered mental status. EXAM: CT HEAD WITHOUT CONTRAST TECHNIQUE: Contiguous axial images were obtained from the base of the skull through the vertex without intravenous contrast. COMPARISON:  September 09, 2019 FINDINGS: Brain: There is mild cerebral atrophy with widening of the extra-axial spaces and ventricular dilatation. There are areas of decreased attenuation within the white matter tracts of the supratentorial brain, consistent with microvascular disease changes. Small chronic bilateral basal ganglia lacunar infarcts are seen. Vascular: No hyperdense vessel or unexpected calcification. Skull: Normal. Negative for fracture or focal lesion. Sinuses/Orbits: No acute finding. Other: None. IMPRESSION: 1. Generalized cerebral atrophy. 2.  No acute intracranial abnormality. Electronically Signed   By: Virgina Norfolk M.D.   On: 09/14/2019 19:23   CT Head Wo Contrast  Result Date: 09/09/2019 CLINICAL DATA:  [place] from bed EXAM: CT HEAD WITHOUT CONTRAST TECHNIQUE: Contiguous axial images were obtained from the base of the skull through the vertex without intravenous contrast. COMPARISON:  07/28/2017 FINDINGS: Brain: Chronic hypodensities are seen throughout the periventricular white matter and bilateral basal ganglia, consistent with chronic small vessel ischemic changes. Stable diffuse cerebral atrophy. No signs of acute infarct or hemorrhage. The lateral ventricles and remaining midline structures are unremarkable. No acute extra-axial fluid collections. No mass effect. Vascular: No hyperdense vessel or unexpected calcification. Skull: Normal. Negative for fracture or focal lesion. Sinuses/Orbits: No acute finding. Other: None. IMPRESSION: 1. No acute  intracranial process. 2. Chronic small vessel ischemic changes and cerebral atrophy. Electronically Signed   By: Randa Ngo M.D.   On: 09/09/2019 21:44   CT Cervical Spine Wo Contrast  Result Date: 09/09/2019 CLINICAL DATA:  [place] from bed, chronic pain EXAM: CT CERVICAL SPINE WITHOUT CONTRAST TECHNIQUE: Multidetector CT imaging of the cervical spine was performed without intravenous contrast. Multiplanar CT image reconstructions were also generated. COMPARISON:  10/04/2018 FINDINGS: Alignment: Alignment is grossly anatomic. Skull base and vertebrae: No acute displaced fracture. Soft tissues and spinal canal: No prevertebral fluid or swelling. No visible canal hematoma. Disc levels: Mild diffuse cervical spondylosis with disc space narrowing and mild circumferential osteophyte formation at all levels. Changes are most pronounced at C5-6 and C6-7. No significant bony encroachment upon the central canal. There is bony fusion of the left C3/C4 facet, with uncovertebral hypertrophy and facet  hypertrophy resulting in severe left neural foraminal narrowing. Upper chest: Airway is patent.  Lung apices are clear. Other: Reconstructed images demonstrate no additional findings. IMPRESSION: 1. No acute displaced fracture. 2. Multilevel cervical spondylosis. Electronically Signed   By: Randa Ngo M.D.   On: 09/09/2019 21:50   DG Chest Port 1 View  Result Date: 09/14/2019 CLINICAL DATA:  Altered mental status. The fell and hit head on 04/24, followed by a normal head CT. EXAM: PORTABLE CHEST 1 VIEW COMPARISON:  08/31/2019 FINDINGS: Patient has LEFT-sided transvenous pacemaker with leads to the RIGHT atrium and RIGHT ventricle. The heart is enlarged. There is increased opacity at the LEFT lung base, consistent with atelectasis or infiltrate. Suspect LEFT pleural effusion. The increased prominence of interstitial markings raises a question of interstitial edema. IMPRESSION: 1. Cardiomegaly and possible interstitial edema. 2. Increased LEFT lower lobe atelectasis or infiltrate. 3. Probable LEFT pleural effusion. Electronically Signed   By: Nolon Nations M.D.   On: 09/14/2019 19:18   DG Chest Port 1 View  Result Date: 08/31/2019 CLINICAL DATA:  Shortness of breath EXAM: PORTABLE CHEST 1 VIEW COMPARISON:  August 16, 2019 and April 13, 2019 FINDINGS: There is suspected scarring in the left base. There may also be epicardial fat prominence in this area. The appearance is stable. There is no apparent edema or airspace opacity. Heart is upper normal in size with pulmonary vascularity normal. Pacemaker lead tips are attached to the right atrium and right ventricle. No adenopathy. There is aortic atherosclerosis. Bones are osteoporotic. Patient is undergone prior kyphoplasty procedure in the lower thoracic region IMPRESSION: Suspect a degree of scarring in the lateral left base. No edema or airspace opacity. Stable cardiac silhouette. Pacemaker leads attached to right atrium and right ventricle. Bones  osteoporotic. Aortic Atherosclerosis (ICD10-I70.0). Electronically Signed   By: Lowella Grip III M.D.   On: 08/31/2019 15:29   IR KYPHO THORACIC WITH BONE BIOPSY  Result Date: 09/06/2019 INDICATION: Marni Franzoni is a 86 year old. Female with a past medical history of hypertension, hyperlipidemia, sick sinus syndrome, HF, paroxysmal atrial fibrillation not on chronic anticoagulation, [place] 2016, CVA 2017, ESRD on HD, nephrolithiasis, osteopenia, and multiple fractures (thoracic compression fractures, right hip fracture, metatarsal fracture). She presented to [facility] ED 09/01/2019 with complaints of back and abdominal pain. In ED, CT abdomen/pelvis revealed diverticulitis and a new T11 compression fracture. EXAM: Fluoroscopy guided T11 kyphoplasty and biopsy COMPARISON:  MRI of the thoracic spine July 24, 2018; CT of the abdomen August 31, 2019 MEDICATIONS: As antibiotic prophylaxis, Ancef 2 gm IV was ordered pre-procedure and administered intravenously within 1 hour of incision. ANESTHESIA/SEDATION:  Moderate (conscious) sedation was employed during this procedure. A total of Versed 1.5 mg and Fentanyl 50 mcg was administered intravenously. Moderate Sedation Time: 45 minutes. The patient's level of consciousness and vital signs were monitored continuously by radiology nursing throughout the procedure under my direct supervision. FLUOROSCOPY TIME:  Fluoroscopy Time: 12 minutes 6 seconds (409 mGy) COMPLICATIONS: None immediate. PROCEDURE: Following a full explanation of the procedure along with the potential associated complications, an informed witnessed consent was obtained. The patient was placed in prone position on the angiography table. The thoracic spine region was prepped and draped in a sterile fashion. Under fluoroscopy, the the T11 vertebral body was delineated and the skin area was marked. The skin was infiltrated with a 1% Lidocaine approximately 3.5 cm lateral to the spinous process projection on the  right. Using a 22-gauge spinal needle, the soft issue and the peripedicular space and periosteum were infiltrated with Bupivacaine 0.25%. A skin incision was made at the access site. Subsequently, an 11-gauge Kyphon trocar was inserted under fluoroscopic guidance until contact with the pedicle was obtained. The trocar was advanced through the pedicle into the vertebral body with light hammer tapping. The diamond mandrill was removed and one core biopsy was obtained. The skin was infiltrated with a 1% Lidocaine approximately 3.5 cm lateral to the spinous process projection on the left. Using a 22-gauge spinal needle, the soft issue and the peripedicular space and periosteum were infiltrated with Bupivacaine 0.25%. A skin incision was made at the access site. Subsequently, an 11-gauge Kyphon trocar was inserted under fluoroscopic guidance until contact with the pedicle was obtained. The trocar was advanced through the pedicle into the vertebral body with light hammer tapping. The diamond mandrill was removed. A bone drill was coaxially advanced within the anterior third of the vertebral body and then exchanged for inflatable Kyphon balloons. These were centered within the mid-aspect of the vertebral body. The balloons were inflated to create a void to serve as a repository for the bone cement. Both balloons were deflated and through both cannulas, under continuous fluoroscopy guidance in the AP and lateral views. The vertebral body was filled with previously mixed polymethyl-methacrylate (PMMA) added to barium for opacification. Both cannulas were later removed. The access sites were cleaned and covered with a sterile bandage. IMPRESSION: Successful fluoroscopy-guided bilateral transpedicular approach for T11 vertebral body core bone biopsy and kyphoplasty for treatment of osteoporotic fragility fracture. Bone sample obtained was sent for pathology analysis. Electronically Signed   By: Pedro Earls  M.D.   On: 09/06/2019 16:03   CT Maxillofacial Wo Contrast  Result Date: 09/09/2019 CLINICAL DATA:  [place] from bed EXAM: CT MAXILLOFACIAL WITHOUT CONTRAST TECHNIQUE: Multidetector CT imaging of the maxillofacial structures was performed. Multiplanar CT image reconstructions were also generated. COMPARISON:  None. FINDINGS: Osseous: No fracture or mandibular dislocation. No destructive process. Orbits: Negative. No traumatic or inflammatory finding. Sinuses: Clear. Soft tissues: There is mucosal thickening within the left nasal passage. Remaining soft tissues are unremarkable. Limited intracranial: No significant or unexpected finding. IMPRESSION: 1. Mucosal thickening left nasal passage. 2. No acute displaced fracture. Electronically Signed   By: Randa Ngo M.D.   On: 09/09/2019 21:46    Discharge Exam:    Vitals:   09/18/19 1351 09/18/19 1609 09/18/19 2309 09/19/19 0818  BP: (!) 120/55 (!) 111/43 (!) 110/36 (!) 111/38  Pulse: 84 (!) 44 71 77  Resp: 16 19  18   Temp: 97.7 F (36.5 C) 98 F (36.7 C)  46 F (36.7 C) 97.7 F (36.5 C)  TempSrc: Oral     SpO2: 100% (!) 76% 96% 100%  Weight: 51 kg     Height:         Physical Exam  Constitution:  Alert, cooperative, no distress,  Psychiatric: Normal and stable mood and affect, cognition intact,   HEENT: Normocephalic, PERRL, otherwise with in Normal limits  Chest:Chest symmetric Cardio vascular:  S1/S2, RRR, No murmure, No Rubs or Gallops  pulmonary: Clear to auscultation bilaterally, respirations unlabored, negative wheezes / crackles Abdomen: Soft, non-tender, non-distended, bowel sounds,no masses, no organomegaly Muscular skeletal:  Severe generalized weaknesses, Limited exam - in bed, able to move all 4 extremities, Normal strength,  Neuro: CNII-XII intact. , normal motor and sensation, reflexes intact  Extremities: No pitting edema lower extremities, +2 pulses  Skin: Dry, warm to touch, negative for any Rashes, Wounds: per  nursing documentation Pressure Injury 08/16/19 Sacrum Medial Stage 1 -  Intact skin with non-blanchable redness of a localized area usually over a bony prominence. (Active)  08/16/19 1917  Location: Sacrum  Location Orientation: Medial  Staging: Stage 1 -  Intact skin with non-blanchable redness of a localized area usually over a bony prominence.  Wound Description (Comments):   Present on Admission: Yes       The results of significant diagnostics from this hospitalization (including imaging, microbiology, ancillary and laboratory) are listed below for reference.      Microbiology:   Recent Results (from the past 240 hour(s))  Respiratory Panel by RT PCR (Flu A&B, Covid) - Nasopharyngeal Swab     Status: None   Collection Time: 09/14/19  6:46 PM   Specimen: Nasopharyngeal Swab  Result Value Ref Range Status   SARS Coronavirus 2 by RT PCR NEGATIVE NEGATIVE Final    Comment: (NOTE) SARS-CoV-2 target nucleic acids are NOT DETECTED. The SARS-CoV-2 RNA is generally detectable in upper respiratoy specimens during the acute phase of infection. The lowest concentration of SARS-CoV-2 viral copies this assay can detect is 131 copies/mL. A negative result does not preclude SARS-Cov-2 infection and should not be used as the sole basis for treatment or other patient management decisions. A negative result may occur with  improper specimen collection/handling, submission of specimen other than nasopharyngeal swab, presence of viral mutation(s) within the areas targeted by this assay, and inadequate number of viral copies (<131 copies/mL). A negative result must be combined with clinical observations, patient history, and epidemiological information. The expected result is Negative. Fact Sheet for Patients:  PinkCheek.be Fact Sheet for Healthcare Providers:  GravelBags.it This test is not yet ap proved or cleared by the [place] FDA and  has been authorized for detection and/or diagnosis of SARS-CoV-2 by FDA under an Emergency Use Authorization (EUA). This EUA will remain  in effect (meaning this test can be used) for the duration of the COVID-19 declaration under Section 564(b)(1) of the Act, 21 U.S.C. section 360bbb-3(b)(1), unless the authorization is terminated or revoked sooner.    Influenza A by PCR NEGATIVE NEGATIVE Final   Influenza B by PCR NEGATIVE NEGATIVE Final    Comment: (NOTE) The Xpert Xpress SARS-CoV-2/FLU/RSV assay is intended as an aid in  the diagnosis of influenza from Nasopharyngeal swab specimens and  should not be used as a sole basis for treatment. Nasal washings and  aspirates are unacceptable for Xpert Xpress SARS-CoV-2/FLU/RSV  testing. Fact Sheet for Patients: PinkCheek.be Fact Sheet for Healthcare Providers: GravelBags.it This test is not yet approved  or cleared by the [place] and  has been authorized for detection and/or diagnosis of SARS-CoV-2 by  FDA under an Emergency Use Authorization (EUA). This EUA will remain  in effect (meaning this test can be used) for the duration of the  Covid-19 declaration under Section 564(b)(1) of the Act, 21  U.S.C. section 360bbb-3(b)(1), unless the authorization is  terminated or revoked. Performed at [facility], [place] [address], [place], [place] [address]   Culture, blood (Routine X 2) w Reflex to ID Panel     Status: None (Preliminary result)   Collection Time: 09/15/19  6:42 AM   Specimen: BLOOD LEFT HAND  Result Value Ref Range Status   Specimen Description BLOOD LEFT HAND  Final   Special Requests   Final    BOTTLES DRAWN AEROBIC ONLY Blood Culture results may not be optimal due to an inadequate volume of blood received in culture bottles   Culture   Final    NO GROWTH 4 DAYS Performed at [facility], [place] [address], [place], [place] [address]     Report Status PENDING  Incomplete  Culture, blood (Routine X 2) w Reflex to ID Panel     Status: None (Preliminary result)   Collection Time: 09/15/19 10:00 AM   Specimen: BLOOD LEFT HAND  Result Value Ref Range Status   Specimen Description BLOOD LEFT HAND  Final   Special Requests   Final    BOTTLES DRAWN AEROBIC AND ANAEROBIC Blood Culture adequate volume   Culture   Final    NO GROWTH 4 DAYS Performed at [facility], [place] [address] [place], [place] [address]    Report Status PENDING  Incomplete  SARS CORONAVIRUS 2 (TAT 6-24 HRS) Nasopharyngeal Nasopharyngeal Swab     Status: None   Collection Time: 09/18/19  8:07 AM   Specimen: Nasopharyngeal Swab  Result Value Ref Range Status   SARS Coronavirus 2 NEGATIVE NEGATIVE Final    Comment: (NOTE) SARS-CoV-2 target nucleic acids are NOT DETECTED. The SARS-CoV-2 RNA is generally detectable in upper and lower respiratory specimens during the acute phase of infection. Negative results do not preclude SARS-CoV-2 infection, do not rule out co-infections with other pathogens, and should not be used as the sole basis for treatment or other patient management decisions. Negative results must be combined with clinical observations, patient history, and epidemiological information. The expected result is Negative. Fact Sheet for Patients: SugarRoll.be Fact Sheet for Healthcare Providers: https://www.woods-mathews.com/ This test is not yet approved or cleared by the [place] FDA and  has been authorized for detection and/or diagnosis of SARS-CoV-2 by FDA under an Emergency Use Authorization (EUA). This EUA will remain  in effect (meaning this test can be used) for the duration of the COVID-19 declaration under Section 56 4(b)(1) of the Act, 21 U.S.C. section 360bbb-3(b)(1), unless the authorization is terminated or revoked sooner. Performed at [facility], [place] [address]  [place], [place] [address]      Labs:   CBC: Recent Labs  Lab 09/14/19 1624 09/15/19 0959 09/16/19 0314 09/17/19 0508 09/18/19 0354  WBC 15.4* 10.9* 10.7* 8.7 10.6*  HGB 11.8* 10.3* 10.5* 9.0* 9.4*  HCT 37.5 33.2* 32.9* 28.5* 29.8*  MCV 98.7 98.2 96.8 95.6 94.9  PLT 259 222 245 216 937   Basic Metabolic Panel: Recent Labs  Lab 09/14/19 1624 09/15/19 0959 09/16/19 0314 09/17/19 0508 09/18/19 0354  NA 140 139 137 137 136  K  3.7 3.6 4.6 3.5 3.4*  CL 97* 96* 97* 98 97*  CO2 28 30 27 29 27   GLUCOSE 92 94 71 83 80  BUN 11 15 22 10 17   CREATININE 3.11* 3.91* 4.70* 2.28* 3.74*  CALCIUM 7.9* 8.3* 8.3* 8.0* 8.1*   Liver Function Tests: Recent Labs  Lab 09/14/19 1624 09/15/19 0959 09/16/19 0314 09/17/19 0508  AST 21 18 25 20   ALT 6 9 6  <5  ALKPHOS 104 89 85 70  BILITOT 0.5 0.7 1.4* 0.7  PROT 6.1* 5.8* 5.6* 4.8*  ALBUMIN 2.3* 2.1* 2.0* 1.7*   BNP (last 3 results) Recent Labs    03/24/19 1040 08/11/19 0929 08/16/19 0758  BNP 2,088.4* 922.1* 1,660.5*   Cardiac Enzymes: No results for input(s): CKTOTAL, CKMB, CKMBINDEX, TROPONINI in the last 168 hours. CBG: Recent Labs  Lab 09/14/19 2121  GLUCAP 88   Urinalysis    Component Value Date/Time   COLORURINE YELLOW 01/20/2017 1737   APPEARANCEUR CLEAR 01/20/2017 1737   LABSPEC 1.012 01/20/2017 1737   PHURINE 5.0 01/20/2017 1737   GLUCOSEU NEGATIVE 01/20/2017 1737   HGBUR NEGATIVE 01/20/2017 1737   BILIRUBINUR NEGATIVE 01/20/2017 1737   BILIRUBINUR negative 09/24/2016 1453   KETONESUR NEGATIVE 01/20/2017 1737   PROTEINUR 30 (A) 01/20/2017 1737   UROBILINOGEN 0.2 09/24/2016 1453   NITRITE NEGATIVE 01/20/2017 1737   LEUKOCYTESUR NEGATIVE 01/20/2017 1737   Pressure Injury 08/16/19 Sacrum Medial Stage 1 -  Intact skin with non-blanchable redness of a localized area usually over a bony prominence. (Active)  08/16/19 1917  Location: Sacrum  Location Orientation: Medial  Staging: Stage 1 -  Intact skin with  non-blanchable redness of a localized area usually over a bony prominence.  Wound Description (Comments):   Present on Admission: Yes       Time coordinating discharge: Over 45 minutes  SIGNED: Deatra James, MD, FACP, [facility]. [facility],  Please use [email or web address] to Page If 7PM-7AM, please contact night-coverage [email or web address], Password [facility] 09/19/2019, 10:26 AM

## 2019-09-19 NOTE — Progress Notes (Signed)
[facility] Progress Note  Subjective: seen in room.   Vitals:   09/18/19 1609 09/18/19 2309 09/19/19 0818 09/19/19 1544  BP: (!) 111/43 (!) 110/36 (!) 111/38 (!) 128/50  Pulse: (!) 44 71 77 81  Resp: 19  18 18   Temp: 98 F (36.7 C) 98 F (36.7 C) 97.7 F (36.5 C) 98.7 F (37.1 C)  TempSrc:      SpO2: (!) 76% 96% 100% 100%  Weight:      Height:        Exam:  frail elderly WF, pleasant, no distress  ox 3 w/ some assist  chest cta bilat   Cor reg no mrg   abd soft ntnd   Ext no LE edema   [facility]:  [facility]   350/600   51kg    2K/2Ca   AVG -heparin 1400units IVbolus - NoESA   Assessment/ Plan: 1. Healthcare associated pneumonia with delirium:CXR revealed L lower lobe atelectasis vs infiltrate with pleural effusion.  On ceftriaxone and appears to be doing clinically better.  2. ESRD:gets HD 4 d/ wk on MTTS sched. Plan next HD Thursday, sooner if needed. Labs are good.  3. Hypertension/volume: Blood pressure under good control and vol stable. Up 1-2 kg by wts, no vol excess on exam.  4. Anemia: Downtrending hemoglobin and hematocrit, will redose ESA with dialysis. 5. Metabolic bone disease:Corrected calcium 9.2, phos at goal. Continue outpatient binder.  6. Diarrhea: Recent sigmoid diverticulitis, treated with antibiotics. Per primary.  7. Hx T11 compression fracture:No complaint of pain at present, narcotics on hold 8. Debility: plan is for SNF placement. Possible dc soon per pt.      Valerie Terrell 09/19/2019, 4:01 PM   Recent Labs  Lab 09/17/19 0508 09/18/19 0354  K 3.5 3.4*  BUN 10 17  CREATININE 2.28* 3.74*  CALCIUM 8.0* 8.1*  HGB 9.0* 9.4*   Inpatient medications: . amiodarone  200 mg Oral Daily  . Chlorhexidine Gluconate Cloth  6 each Topical Q0600  . darbepoetin (ARANESP) injection - DIALYSIS  40 mcg Intravenous Q Mon-HD  . feeding supplement (ENSURE ENLIVE)  237 mL Oral TID BM  . heparin  5,000 Units  Subcutaneous Q8H  . isosorbide mononitrate  60 mg Oral Daily  . melatonin  3 mg Oral QHS  . multivitamin  1 tablet Oral QHS  . pravastatin  40 mg Oral QHS   . cefTRIAXone (ROCEPHIN)  IV 2 g (09/19/19 1159)   bisacodyl, fentaNYL (SUBLIMAZE) injection, HYDROcodone-acetaminophen, ondansetron (ZOFRAN) IV

## 2019-09-19 NOTE — Progress Notes (Signed)
Occupational Therapy Treatment Patient Details Name: Valerie Terrell MRN: [id number] DOB: 30-Jan-1933 Today's Date: 09/19/2019    History of present illness Valerie Terrell is a 86 y.o. F with hx CHF, chronic hypoxic respiratory failure on 2L, ESRD on HD TThS, HTN, pAF no longer on AC, NICM and SSS with pacer and hx stroke who presented from HD with hypoxia, decreased sensorium, confusion and generalized weakness for 1-2 days.  Found to have HCAP.   OT comments  This 86 yo female admitted with above presents to acute OT with making progress with overall mobility today (able to ambulate a few steps), worked on sitting balance, standing balance, ADLs at EOB. Pt will continue to benefit from acute OT with follow up at SNF.  Follow Up Recommendations  SNF;Supervision/Assistance - 24 hour    Equipment Recommendations  Other (comment)(TBD next venue)       Precautions / Restrictions Precautions Precautions: Fall Precaution Comments: O2 dependent Restrictions Weight Bearing Restrictions: No       Mobility Bed Mobility Overal bed mobility: Needs Assistance Bed Mobility: Supine to Sit     Supine to sit: HOB elevated;Mod assist     General bed mobility comments: able to move legs off bed, assist for lifting trunk and scooting; initially leaning to L  Transfers Overall transfer level: Needs assistance Equipment used: Rolling walker (2 wheeled) Transfers: Sit to/from Stand Sit to Stand: Mod assist;+2 physical assistance;Min assist         General transfer comment: inital sit to stand with min A of 2 for safety and cues for hand placement, with progressing repitition she needed a little more lifting help    Balance Overall balance assessment: Needs assistance Sitting-balance support: Feet supported Sitting balance-Leahy Scale: Poor Sitting balance - Comments: initially leaning to L in sitting, able to come up over time to sit more centered, but with kyphoscoliotic posture and  cues for lifting trunk with UE support and hold with scap retractions x several seconds over about 5 reps; work with OT for combing hair, etc at EOB close S   Standing balance support: Bilateral upper extremity supported Standing balance-Leahy Scale: Poor Standing balance comment: min to mod A with UE support                           ADL either performed or assessed with clinical judgement   ADL Overall ADL's : Needs assistance/impaired     Grooming: Brushing hair;Minimal assistance                   Toilet Transfer: Moderate assistance;Ambulation;RW Toilet Transfer Details (indicate cue type and reason): 5 feet forward                 Vision Patient Visual Report: No change from baseline            Cognition Arousal/Alertness: Awake/alert Behavior During Therapy: WFL for tasks assessed/performed Overall Cognitive Status: Within Functional Limits for tasks assessed                                 General Comments: Today seems alert and motivated for mobility and able to attend to task with min cues.  She reported nursing had helped her up to [facility] and she took steps with assist              General Comments On O2 throughout  with min c/o SOB, HR stable    Pertinent Vitals/ Pain       Pain Assessment: No/denies pain         Frequency  Min 2X/week        Progress Toward Goals  OT Goals(current goals can now be found in the care plan section)  Progress towards OT goals: Progressing toward goals     Plan Discharge plan remains appropriate    Co-evaluation    PT/OT/SLP Co-Evaluation/Treatment: Yes Reason for Co-Treatment: For patient/therapist safety;To address functional/ADL transfers PT goals addressed during session: Mobility/safety with mobility;Balance;Proper use of DME;Strengthening/ROM OT goals addressed during session: Strengthening/ROM;ADL's and self-care      AM-PAC OT "6 Clicks" Daily Activity     Outcome  Measure   Help from another person eating meals?: None Help from another person taking care of personal grooming?: A Little Help from another person toileting, which includes using toliet, bedpan, or urinal?: A Lot Help from another person bathing (including washing, rinsing, drying)?: A Lot Help from another person to put on and taking off regular upper body clothing?: A Lot Help from another person to put on and taking off regular lower body clothing?: Total 6 Click Score: 14    End of Session Equipment Utilized During Treatment: Gait belt;Rolling walker  OT Visit Diagnosis: Unsteadiness on feet (R26.81);Other abnormalities of gait and mobility (R26.89);Muscle weakness (generalized) (M62.81)   Activity Tolerance Patient tolerated treatment well   Patient Left in chair;with call bell/phone within reach;with chair alarm set   Nurse Communication Mobility status        Time: 1308-6578 OT Time Calculation (min): 28 min  Charges: OT General Charges $OT Visit: 1 Visit OT Treatments $Self Care/Home Management : 8-22 mins [place], OTR/L Acute [employer] Pager [phone] Office [phone]      Almon Register 09/19/2019, 12:53 PM

## 2019-09-20 ENCOUNTER — Telehealth: Payer: Medicare Other | Admitting: Family Medicine

## 2019-09-20 DIAGNOSIS — M2550 Pain in unspecified joint: Secondary | ICD-10-CM | POA: Diagnosis not present

## 2019-09-20 DIAGNOSIS — N186 End stage renal disease: Secondary | ICD-10-CM | POA: Diagnosis not present

## 2019-09-20 DIAGNOSIS — Z9181 History of falling: Secondary | ICD-10-CM | POA: Diagnosis not present

## 2019-09-20 DIAGNOSIS — R2689 Other abnormalities of gait and mobility: Secondary | ICD-10-CM | POA: Diagnosis not present

## 2019-09-20 DIAGNOSIS — I5041 Acute combined systolic (congestive) and diastolic (congestive) heart failure: Secondary | ICD-10-CM | POA: Diagnosis not present

## 2019-09-20 DIAGNOSIS — R1312 Dysphagia, oropharyngeal phase: Secondary | ICD-10-CM | POA: Diagnosis not present

## 2019-09-20 DIAGNOSIS — Z79899 Other long term (current) drug therapy: Secondary | ICD-10-CM | POA: Diagnosis not present

## 2019-09-20 DIAGNOSIS — J189 Pneumonia, unspecified organism: Secondary | ICD-10-CM | POA: Diagnosis not present

## 2019-09-20 DIAGNOSIS — D631 Anemia in chronic kidney disease: Secondary | ICD-10-CM | POA: Diagnosis not present

## 2019-09-20 DIAGNOSIS — Z743 Need for continuous supervision: Secondary | ICD-10-CM | POA: Diagnosis not present

## 2019-09-20 DIAGNOSIS — D689 Coagulation defect, unspecified: Secondary | ICD-10-CM | POA: Diagnosis not present

## 2019-09-20 DIAGNOSIS — R269 Unspecified abnormalities of gait and mobility: Secondary | ICD-10-CM | POA: Diagnosis not present

## 2019-09-20 DIAGNOSIS — D638 Anemia in other chronic diseases classified elsewhere: Secondary | ICD-10-CM | POA: Diagnosis not present

## 2019-09-20 DIAGNOSIS — I132 Hypertensive heart and chronic kidney disease with heart failure and with stage 5 chronic kidney disease, or end stage renal disease: Secondary | ICD-10-CM | POA: Diagnosis not present

## 2019-09-20 DIAGNOSIS — I480 Paroxysmal atrial fibrillation: Secondary | ICD-10-CM | POA: Diagnosis not present

## 2019-09-20 DIAGNOSIS — E559 Vitamin D deficiency, unspecified: Secondary | ICD-10-CM | POA: Diagnosis not present

## 2019-09-20 DIAGNOSIS — Z7401 Bed confinement status: Secondary | ICD-10-CM | POA: Diagnosis not present

## 2019-09-20 DIAGNOSIS — D509 Iron deficiency anemia, unspecified: Secondary | ICD-10-CM | POA: Diagnosis not present

## 2019-09-20 DIAGNOSIS — R2681 Unsteadiness on feet: Secondary | ICD-10-CM | POA: Diagnosis not present

## 2019-09-20 DIAGNOSIS — D649 Anemia, unspecified: Secondary | ICD-10-CM | POA: Diagnosis not present

## 2019-09-20 DIAGNOSIS — N2581 Secondary hyperparathyroidism of renal origin: Secondary | ICD-10-CM | POA: Diagnosis not present

## 2019-09-20 DIAGNOSIS — Z992 Dependence on renal dialysis: Secondary | ICD-10-CM | POA: Diagnosis not present

## 2019-09-20 DIAGNOSIS — I5042 Chronic combined systolic (congestive) and diastolic (congestive) heart failure: Secondary | ICD-10-CM | POA: Diagnosis not present

## 2019-09-20 DIAGNOSIS — E876 Hypokalemia: Secondary | ICD-10-CM | POA: Diagnosis not present

## 2019-09-20 DIAGNOSIS — E569 Vitamin deficiency, unspecified: Secondary | ICD-10-CM | POA: Diagnosis not present

## 2019-09-20 DIAGNOSIS — M6281 Muscle weakness (generalized): Secondary | ICD-10-CM | POA: Diagnosis not present

## 2019-09-20 DIAGNOSIS — E8770 Fluid overload, unspecified: Secondary | ICD-10-CM | POA: Diagnosis not present

## 2019-09-20 DIAGNOSIS — R498 Other voice and resonance disorders: Secondary | ICD-10-CM | POA: Diagnosis not present

## 2019-09-20 DIAGNOSIS — E440 Moderate protein-calorie malnutrition: Secondary | ICD-10-CM | POA: Diagnosis not present

## 2019-09-20 LAB — CULTURE, BLOOD (ROUTINE X 2)
Culture: NO GROWTH
Culture: NO GROWTH
Special Requests: ADEQUATE

## 2019-09-20 NOTE — Progress Notes (Signed)
Report called to [facility]. 

## 2019-09-20 NOTE — Progress Notes (Signed)
[facility] Progress Note  Subjective: seen in room.   Vitals:   09/19/19 0818 09/19/19 1544 09/19/19 2327 09/20/19 0749  BP: (!) 111/38 (!) 128/50 (!) 122/53 (!) 129/58  Pulse: 77 81 79 76  Resp: 18 18  20   Temp: 97.7 F (36.5 C) 98.7 F (37.1 C) 98.2 F (36.8 C) 98.6 F (37 C)  TempSrc:      SpO2: 100% 100% 98% 99%  Weight:      Height:        Exam:  frail elderly WF, pleasant, no distress  ox 3 w/ some assist  chest cta bilat   Cor reg no mrg   abd soft ntnd   Ext no LE edema   [facility]   350/600   51kg    2K/2Ca   AVG -heparin 1400units IVbolus - NoESA   Assessment/ Plan: 1. Healthcare associated pneumonia with delirium:CXR revealed L lower lobe atelectasis vs infiltrate with pleural effusion.  SP ceftriaxone x 5 days. Better. For dc today.  2. ESRD:gets HD 4 d/ wk on MTTS sched. Will go to OP HD unit for HD tomorrow.  3. Hypertension/volume: Blood pressure under good control and vol stable 4. Anemia: Downtrending hemoglobin and hematocrit, will redose ESA with dialysis.Hb 10.3 > 9.4 while here.  5. Metabolic bone disease:Corrected calcium 9.2, phos at goal. Continue outpatient binder.  6. Diarrhea: Recent sigmoid diverticulitis, sp antibiotics. Per primary.  7. Hx T11 compression fracture:No complaint of pain at present, narcotics on hold 8. Debility: for dc to [facility] today     Rob Olyver Hawes 09/20/2019, 1:31 PM   Recent Labs  Lab 09/17/19 0508 09/18/19 0354  K 3.5 3.4*  BUN 10 17  CREATININE 2.28* 3.74*  CALCIUM 8.0* 8.1*  HGB 9.0* 9.4*   Inpatient medications: . amiodarone  200 mg Oral Daily  . Chlorhexidine Gluconate Cloth  6 each Topical Q0600  . darbepoetin (ARANESP) injection - DIALYSIS  40 mcg Intravenous Q Mon-HD  . feeding supplement (ENSURE ENLIVE)  237 mL Oral TID BM  . heparin  5,000 Units Subcutaneous Q8H  . isosorbide mononitrate  60 mg Oral Daily  . melatonin  3 mg Oral QHS  .  multivitamin  1 tablet Oral QHS  . pravastatin  40 mg Oral QHS    bisacodyl, fentaNYL (SUBLIMAZE) injection, HYDROcodone-acetaminophen, ondansetron (ZOFRAN) IV

## 2019-09-20 NOTE — TOC Transition Note (Signed)
Transition of Care [facility]) - CM/SW Discharge Note   Patient Details  Name: Valerie Terrell MRN: [id number] Date of Birth: Dec 07, 1932  [facility]) CM/SW Contact:  Angelita Ingles, RN Phone Number: 09/20/2019, 9:38 AM   Clinical Narrative:   Patient to discharge to [facility]. Daughter and patient have been made aware of discharge today. Discharge packet on chart. Bedside nurse updated and transportation arranged via [facility]. No further needs. CM will sign off.  Please call report to: Patrick Jupiter Rm# [id number] [phone]    Final next level of care: Skilled Nursing Facility([facility]) Barriers to Discharge: No Barriers Identified   Patient Goals and CMS Choice Patient states their goals for this hospitalization and ongoing recovery are:: To get therapy started right away CMS Medicare.gov Compare Post Acute Care list provided to:: Patient Represenative (must comment)([place]) Choice offered to / list presented to : NA  Discharge Placement                       Discharge Plan and Services In-house Referral: Clinical Social Work, Hospice / Palliative Care              DME Arranged: N/A DME Agency: NA       HH Arranged: NA HH Agency: NA        Social Determinants of Health (SDOH) Interventions     Readmission Risk Interventions Readmission Risk Prevention Plan 10/06/2018  Transportation Screening Complete  Medication Review [employer]) Complete  PCP or Specialist appointment within 3-5 days of discharge Complete  HRI or Home Care Consult Complete  SW Recovery Care/Counseling Consult Complete  [place] Complete  Some recent data might be hidden

## 2019-09-20 NOTE — Progress Notes (Signed)
  PROGRESS NOTE  Patient seen and examined with family member at bedside.  Vital signs stable.  Patient ready to discharge to SNF.  She does not report any changes or concerns overnight.  Stable for discharge.  See discharge summary done by Dr. Roger Shelter.   Dessa Phi, DO [facility] 09/20/2019, 9:57 AM  Available via Epic secure chat 7am-7pm After these hours, please refer to coverage provider listed on [email or web address]

## 2019-09-20 NOTE — Discharge Summary (Addendum)
Physician Discharge Summary [facility]    Patient: Varina Hulon                   Admit date: 09/14/2019   DOB: [birth year]             Discharge date:09/20/2019/10:07 AM FYB:[id number]                          PCP: Tammi Sou, MD  Disposition: SNF  Recommendations for Outpatient Follow-up:   . Follow up: as needed  Discharge Condition: Stable   Code Status:   Code Status: DNR  Diet recommendation: Renal diet   Discharge Diagnoses:    Principal Problem:   HAP (hospital-acquired pneumonia) Active Problems:   PAF (paroxysmal atrial fibrillation) ([place])   Essential hypertension   Hyperlipidemia   History of CVA (cerebrovascular accident)   Cardiac pacemaker   ESRD on dialysis ([place])   Acute respiratory failure with hypoxia ([place])   Hypoxia   Moderate protein-calorie malnutrition ([place])   Compression fracture of T11 vertebra ([place])   Acute encephalopathy   Subjective:   5/5: Patient seen and examined with family member at bedside.  Vital signs stable.  Patient ready to discharge to SNF.  She does not report any changes or concerns overnight.  Stable for discharge.  See discharge summary done by Dr. Roger Shelter as below:    History of Present Illness/ Hospital Course Kathleen Argue Summary:   Brief Narrative:  Mrs. Veno a 86 y.o.Fwith hx CHF, chronic hypoxic respiratory failure on 2L, ESRD on HD TThS, HTN, pAF no longer on AC, NICM and SSS with pacer and hx stroke who presented from HD with hypoxia, decreased sensorium, confusion and generalized weakness for 1-2 days.  Patient recently admitted for a week for confusion, discharged 1 week ago. Diagnosed with acute diverticulitis complicated by N27 compression fracture and delirium. Got kyphoplasty, was discharged to home, was doing better until a few days ago. On day of admission, HHPT noted hypoxia, but patient was well enough to go to HD. There she was hypoxic again, too sleepy on HD to tolerate,  and was sent to ER.  In the ER, WBC 15K. CXR showed new left effusion and infiltrate.  5/1: Patient receiving hemodialysis today. Awaiting palliative discussion with family members. No acute overnight events noted. No concerns or complaints this am.  5/2: Patient appears to be doing well this morning with no symptomatic complaints or concerns.  Plans for further hemodialysis in a.m.  CSW working on placement to SNF which is still pending.  She is not agreeable to hospice at this time.  Potential discharge in next 24 hours after hemodialysis tomorrow if SNF bed available.  09/18/2019 -patient was seen and examined, daughter present at bedside, has agreed to discharge to SNF.  No complaints overnight.  Repeated SARS-CoV-2 continues to be negative  Detailed discharge summary   Community acquired pneumonia left lower lobe Recent Abx, but mild disease, no history drug resistant infections. -Completed 5 days of IV antibiotics Rocephin. Afebrile normotensive, cultures negative to date   ESRD on HD MTThS -Appreciate nephrology for hemodialysis with further hemodialysis Today 09/18/2019  Anemia-downtrending -No overt bleeding noted -Nephrology planning to reduce ESA with dialysis  Chronic systolic and diastolic CHF Chronic hypoxic respiratory failure Non-ischemic cardiomyopathy SSS with pacer  Paroxysmal atrial fibrillation Not on AC due to recurrent falls. -Continue amiodarone  Hypertension-controlled Cerebrovascular disease secondary prevention BPnormal -ContinueImdur, statin  Stage  I Pressure injury sacrum, POA -Continue to monitor  Failure to thrive As evidenced by BMI 22, chronic renal and cardiac failure, poor PO intake, pressure injury. -Palliative discussion noted on 5/1 as well as 5/2, but patient wants to continue on dialysis and full scope of care. -Planning for discharge to SNF     Code Status:DNR Family Communication: Discussed with  daughter on phone Disposition Plan: Plan to discharge to SNF     Consultants:  Nephrology  Palliative Care  Procedures:  Hemodialysis, last 1 today 09/18/2019    Nutritional status:  Nutrition Problem: Moderate Malnutrition Etiology: chronic illness(ESRD on HD) Signs/Symptoms: moderate fat depletion, moderate muscle depletion Interventions: Ensure Enlive (each supplement provides 350kcal and 20 grams of protein), MVI   Discharge Instructions:   Discharge Instructions    (HEART FAILURE PATIENTS) Call MD:  Anytime you have any of the following symptoms: 1) 3 pound weight gain in 24 hours or 5 pounds in 1 week 2) shortness of breath, with or without a dry hacking cough 3) swelling in the hands, feet or stomach 4) if you have to sleep on extra pillows at night in order to breathe.   Complete by: As directed    Activity as tolerated - No restrictions   Complete by: As directed    Call MD for:  difficulty breathing, headache or visual disturbances   Complete by: As directed    Call MD for:  persistant nausea and vomiting   Complete by: As directed    Call MD for:  redness, tenderness, or signs of infection (pain, swelling, redness, odor or green/yellow discharge around incision site)   Complete by: As directed    Call MD for:  temperature >100.4   Complete by: As directed    Diet - low sodium heart healthy   Complete by: As directed    Discharge instructions   Complete by: As directed    Follow-up with PCP in 1 to 2 weeks   Increase activity slowly   Complete by: As directed        Medication List    STOP taking these medications   melatonin 3 MG Tabs tablet   naloxone 0.4 MG/ML injection Commonly known as: NARCAN   Oxycodone HCl 10 MG Tabs     TAKE these medications   acetaminophen 500 MG tablet Commonly known as: TYLENOL Take 500-1,000 mg 3 (three) times daily as needed by mouth for moderate pain.   ACT Dry Mouth Lozg Use as directed 1 lozenge in the  mouth or throat daily as needed (for dry mouth).   amiodarone 200 MG tablet Commonly known as: PACERONE TAKE 1 TABLET BY MOUTH  DAILY   aspirin EC 325 MG tablet Take 325 mg by mouth every evening.   b complex-vitamin c-folic acid 0.8 MG Tabs tablet Take 1 tablet by mouth See admin instructions. Take one tablet by mouth on Tuesday, Thursday, Saturday - after dialysis   calcitonin (salmon) 200 UNIT/ACT nasal spray Commonly known as: MIACALCIN/FORTICAL Place 1 spray into alternate nostrils daily.   D-5000 125 MCG (5000 UT) Tabs Generic drug: Cholecalciferol Take 1 tablet by mouth daily.   Denta 5000 Plus 1.1 % Crea dental cream Generic drug: sodium fluoride Place 1 application onto teeth at bedtime.   docusate sodium 100 MG capsule Commonly known as: COLACE Take 100 mg by mouth daily as needed for mild constipation.   doxercalciferol 2.5 MCG capsule Commonly known as: HECTOROL Take 2.5 mcg by mouth 3 (three)  times a week.   feeding supplement (PRO-STAT SUGAR FREE 64) Liqd Take 30 mLs by mouth 2 (two) times daily.   heparin 1000 unit/mL Soln injection 1,000 Units by Dialysis route one time in dialysis.   hydrocortisone cream 1 % Apply topically 4 (four) times daily.   isosorbide mononitrate 60 MG 24 hr tablet Commonly known as: IMDUR TAKE 1 TABLET BY MOUTH  DAILY ; TAKE 1 TABLET ON  SUN, MON, WED, FRI MORNINGS AND 1 TABLET ON TUES, THU,  SAT AFTER DIALYSIS What changed: See the new instructions.   Lidocaine 4 % Ptch Apply 1 patch topically daily as needed (Mild pain).   lidocaine-prilocaine cream Commonly known as: EMLA Apply 1 application topically See admin instructions. Apply small amount to access site (AVF) one hour before dialysis, cover with occlusive dressing (saran wrap)   multivitamin with minerals tablet Take 1 tablet by mouth at bedtime.   ondansetron 4 MG tablet Commonly known as: ZOFRAN Take 1 tablet (4 mg total) by mouth every 8 (eight) hours as  needed for nausea or vomiting.   polyethylene glycol 17 g packet Commonly known as: MIRALAX / GLYCOLAX Take 17 g by mouth daily as needed for mild constipation.   polyvinyl alcohol 1.4 % ophthalmic solution Commonly known as: LIQUIFILM TEARS Place 1-2 drops into both eyes daily as needed for dry eyes.   pravastatin 40 MG tablet Commonly known as: PRAVACHOL TAKE 1 TABLET BY MOUTH  EVERY EVENING What changed: when to take this   sevelamer carbonate 800 MG tablet Commonly known as: RENVELA Take 1,600 mg by mouth 3 (three) times daily with meals.   vitamin C 500 MG tablet Commonly known as: ASCORBIC ACID Take 500 mg by mouth daily.   Vitamin D (Ergocalciferol) 1.25 MG (50000 UNIT) Caps capsule Commonly known as: DRISDOL Take 1 capsule (50,000 Units total) by mouth every Thursday.      Contact information for after-discharge care    Destination    [facility]erred [facility] .   Service: Skilled Nursing Contact information: [place] [id number] [phone]             Allergies  Allergen Reactions  . Clonidine Derivatives Palpitations and Other (See Comments)    VERY SEDATED  . Adhesive [Tape] Other (See Comments)    Tears skin up  . Codeine Nausea Only  . Nickel Rash  . Sulfa Antibiotics Other (See Comments)    Reaction unknown Did not feel well when taken Reaction unknown     Procedures /Studies:   CT ABDOMEN PELVIS WO CONTRAST  Result Date: 08/31/2019 CLINICAL DATA:  Acute on chronic back pain for several days, emesis, short of breath EXAM: CT ABDOMEN AND PELVIS WITHOUT CONTRAST TECHNIQUE: Multidetector CT imaging of the abdomen and pelvis was performed following the standard protocol without IV contrast. COMPARISON:  12/13/2018 FINDINGS: Lower chest: Hypoventilatory changes are seen at the lung bases. There is chronic scarring at the left lung base. Diffuse atherosclerosis of the aorta and coronary vessels again noted.  Extensive calcification of the aortic and mitral valves. Pacemaker unchanged. Hepatobiliary: Gallbladder sludge is identified without cholecystitis. The liver is unremarkable. Pancreas: Unremarkable. No pancreatic ductal dilatation or surrounding inflammatory changes. Spleen: Normal in size without focal abnormality. Adrenals/Urinary Tract: Progressive bilateral renal cortical atrophy consistent with end-stage renal disease. Stable cysts right kidney. No urinary tract calculi or obstruction. Bladder is unremarkable. The adrenals are unremarkable. Stomach/Bowel: There is circumferential wall thickening of the mid sigmoid  colon, reference image 47. Mild pericolonic fat stranding consistent with acute colitis or diverticulitis. No bowel obstruction or ileus. The appendix is surgically absent. Vascular/Lymphatic: There is severe atherosclerosis throughout the aorta and its branches. No pathologic adenopathy. Reproductive: Uterus is markedly atrophic.  No adnexal masses. Other: No free fluid or free gas. Diffuse atrophy of the abdominal wall musculature unchanged. Musculoskeletal: Chronic T10 compression deformity with previous vertebral augmentation. There is an age-indeterminate T12 compression fracture, new since prior CT. Less than 25% loss of height. No significant paraspinal hematoma. No additional fractures. Postsurgical changes right hip. Reconstructed images demonstrate no additional findings. IMPRESSION: 1. Circumferential wall thickening of the mid sigmoid colon with mild pericolonic fat stranding, consistent with acute uncomplicated diverticulitis or colitis. 2. New T12 compression deformity, with less than 25% loss of height. Based on appearance, this could reflect an acute or subacute fracture. 3. Sequela of end-stage renal disease. 4. Aortic Atherosclerosis (ICD10-I70.0). Severe coronary artery atherosclerosis. Electronically Signed   By: Randa Ngo M.D.   On: 08/31/2019 19:37   DG Thoracic Spine  W/Swimmers  Result Date: 09/10/2019 CLINICAL DATA:  Pain after fall.  Recent kyphoplasty. EXAM: THORACIC SPINE - 3 VIEWS COMPARISON:  None. FINDINGS: Patient is status post kyphoplasty at T12 and T9. Anterior wedging of L1, mild, is unchanged since the August 31, 2019 CT scan. Mild anterior wedging of T10 and T11 is also unchanged since the comparison CT scan. Mild anterior wedging of T8 is stable since February 2021. No other fractures are identified. Evaluation of the upper thoracic spine is somewhat limited. IMPRESSION: The patient is status post kyphoplasties at T9 and T12. Anterior wedging at several levels is stable since comparison studies as above. No acute fractures or malalignment identified. Dense calcified atherosclerosis in the abdominal aorta. Electronically Signed   By: Dorise Bullion III M.D   On: 09/10/2019 09:25   DG Lumbar Spine Complete  Result Date: 09/10/2019 CLINICAL DATA:  Recent fall, post kyphoplasty. EXAM: LUMBAR SPINE - COMPLETE 4+ VIEW COMPARISON:  07/04/2019 FINDINGS: Interval T11 kyphoplasty. Osteopenia. Spinal degenerative changes. No signs of acute fracture. No subluxation. Extensive vascular calcifications project anterior to the abdominal aorta as before. IMPRESSION: Interval T11 kyphoplasty. No signs of acute fracture. Osteopenia and degenerative changes. Electronically Signed   By: Zetta Bills M.D.   On: 09/10/2019 09:26   CT Head Wo Contrast  Result Date: 09/14/2019 CLINICAL DATA:  Altered mental status. EXAM: CT HEAD WITHOUT CONTRAST TECHNIQUE: Contiguous axial images were obtained from the base of the skull through the vertex without intravenous contrast. COMPARISON:  September 09, 2019 FINDINGS: Brain: There is mild cerebral atrophy with widening of the extra-axial spaces and ventricular dilatation. There are areas of decreased attenuation within the white matter tracts of the supratentorial brain, consistent with microvascular disease changes. Small chronic  bilateral basal ganglia lacunar infarcts are seen. Vascular: No hyperdense vessel or unexpected calcification. Skull: Normal. Negative for fracture or focal lesion. Sinuses/Orbits: No acute finding. Other: None. IMPRESSION: 1. Generalized cerebral atrophy. 2. No acute intracranial abnormality. Electronically Signed   By: Virgina Norfolk M.D.   On: 09/14/2019 19:23   CT Head Wo Contrast  Result Date: 09/09/2019 CLINICAL DATA:  [place] from bed EXAM: CT HEAD WITHOUT CONTRAST TECHNIQUE: Contiguous axial images were obtained from the base of the skull through the vertex without intravenous contrast. COMPARISON:  07/28/2017 FINDINGS: Brain: Chronic hypodensities are seen throughout the periventricular white matter and bilateral basal ganglia, consistent with chronic small vessel ischemic changes.  Stable diffuse cerebral atrophy. No signs of acute infarct or hemorrhage. The lateral ventricles and remaining midline structures are unremarkable. No acute extra-axial fluid collections. No mass effect. Vascular: No hyperdense vessel or unexpected calcification. Skull: Normal. Negative for fracture or focal lesion. Sinuses/Orbits: No acute finding. Other: None. IMPRESSION: 1. No acute intracranial process. 2. Chronic small vessel ischemic changes and cerebral atrophy. Electronically Signed   By: Randa Ngo M.D.   On: 09/09/2019 21:44   CT Cervical Spine Wo Contrast  Result Date: 09/09/2019 CLINICAL DATA:  [place] from bed, chronic pain EXAM: CT CERVICAL SPINE WITHOUT CONTRAST TECHNIQUE: Multidetector CT imaging of the cervical spine was performed without intravenous contrast. Multiplanar CT image reconstructions were also generated. COMPARISON:  10/04/2018 FINDINGS: Alignment: Alignment is grossly anatomic. Skull base and vertebrae: No acute displaced fracture. Soft tissues and spinal canal: No prevertebral fluid or swelling. No visible canal hematoma. Disc levels: Mild diffuse cervical spondylosis with disc space  narrowing and mild circumferential osteophyte formation at all levels. Changes are most pronounced at C5-6 and C6-7. No significant bony encroachment upon the central canal. There is bony fusion of the left C3/C4 facet, with uncovertebral hypertrophy and facet hypertrophy resulting in severe left neural foraminal narrowing. Upper chest: Airway is patent.  Lung apices are clear. Other: Reconstructed images demonstrate no additional findings. IMPRESSION: 1. No acute displaced fracture. 2. Multilevel cervical spondylosis. Electronically Signed   By: Randa Ngo M.D.   On: 09/09/2019 21:50   DG Chest Port 1 View  Result Date: 09/14/2019 CLINICAL DATA:  Altered mental status. The fell and hit head on 04/24, followed by a normal head CT. EXAM: PORTABLE CHEST 1 VIEW COMPARISON:  08/31/2019 FINDINGS: Patient has LEFT-sided transvenous pacemaker with leads to the RIGHT atrium and RIGHT ventricle. The heart is enlarged. There is increased opacity at the LEFT lung base, consistent with atelectasis or infiltrate. Suspect LEFT pleural effusion. The increased prominence of interstitial markings raises a question of interstitial edema. IMPRESSION: 1. Cardiomegaly and possible interstitial edema. 2. Increased LEFT lower lobe atelectasis or infiltrate. 3. Probable LEFT pleural effusion. Electronically Signed   By: Nolon Nations M.D.   On: 09/14/2019 19:18   DG Chest Port 1 View  Result Date: 08/31/2019 CLINICAL DATA:  Shortness of breath EXAM: PORTABLE CHEST 1 VIEW COMPARISON:  August 16, 2019 and April 13, 2019 FINDINGS: There is suspected scarring in the left base. There may also be epicardial fat prominence in this area. The appearance is stable. There is no apparent edema or airspace opacity. Heart is upper normal in size with pulmonary vascularity normal. Pacemaker lead tips are attached to the right atrium and right ventricle. No adenopathy. There is aortic atherosclerosis. Bones are osteoporotic. Patient is  undergone prior kyphoplasty procedure in the lower thoracic region IMPRESSION: Suspect a degree of scarring in the lateral left base. No edema or airspace opacity. Stable cardiac silhouette. Pacemaker leads attached to right atrium and right ventricle. Bones osteoporotic. Aortic Atherosclerosis (ICD10-I70.0). Electronically Signed   By: Lowella Grip III M.D.   On: 08/31/2019 15:29   IR KYPHO THORACIC WITH BONE BIOPSY  Result Date: 09/06/2019 INDICATION: Taziah Difatta is a 86 year old. Female with a past medical history of hypertension, hyperlipidemia, sick sinus syndrome, HF, paroxysmal atrial fibrillation not on chronic anticoagulation, [place] 2016, CVA 2017, ESRD on HD, nephrolithiasis, osteopenia, and multiple fractures (thoracic compression fractures, right hip fracture, metatarsal fracture). She presented to [facility] ED 09/01/2019 with complaints of back and abdominal pain. In ED, CT  abdomen/pelvis revealed diverticulitis and a new T11 compression fracture. EXAM: Fluoroscopy guided T11 kyphoplasty and biopsy COMPARISON:  MRI of the thoracic spine July 24, 2018; CT of the abdomen August 31, 2019 MEDICATIONS: As antibiotic prophylaxis, Ancef 2 gm IV was ordered pre-procedure and administered intravenously within 1 hour of incision. ANESTHESIA/SEDATION: Moderate (conscious) sedation was employed during this procedure. A total of Versed 1.5 mg and Fentanyl 50 mcg was administered intravenously. Moderate Sedation Time: 45 minutes. The patient's level of consciousness and vital signs were monitored continuously by radiology nursing throughout the procedure under my direct supervision. FLUOROSCOPY TIME:  Fluoroscopy Time: 12 minutes 6 seconds (161 mGy) COMPLICATIONS: None immediate. PROCEDURE: Following a full explanation of the procedure along with the potential associated complications, an informed witnessed consent was obtained. The patient was placed in prone position on the angiography table. The thoracic spine  region was prepped and draped in a sterile fashion. Under fluoroscopy, the the T11 vertebral body was delineated and the skin area was marked. The skin was infiltrated with a 1% Lidocaine approximately 3.5 cm lateral to the spinous process projection on the right. Using a 22-gauge spinal needle, the soft issue and the peripedicular space and periosteum were infiltrated with Bupivacaine 0.25%. A skin incision was made at the access site. Subsequently, an 11-gauge Kyphon trocar was inserted under fluoroscopic guidance until contact with the pedicle was obtained. The trocar was advanced through the pedicle into the vertebral body with light hammer tapping. The diamond mandrill was removed and one core biopsy was obtained. The skin was infiltrated with a 1% Lidocaine approximately 3.5 cm lateral to the spinous process projection on the left. Using a 22-gauge spinal needle, the soft issue and the peripedicular space and periosteum were infiltrated with Bupivacaine 0.25%. A skin incision was made at the access site. Subsequently, an 11-gauge Kyphon trocar was inserted under fluoroscopic guidance until contact with the pedicle was obtained. The trocar was advanced through the pedicle into the vertebral body with light hammer tapping. The diamond mandrill was removed. A bone drill was coaxially advanced within the anterior third of the vertebral body and then exchanged for inflatable Kyphon balloons. These were centered within the mid-aspect of the vertebral body. The balloons were inflated to create a void to serve as a repository for the bone cement. Both balloons were deflated and through both cannulas, under continuous fluoroscopy guidance in the AP and lateral views. The vertebral body was filled with previously mixed polymethyl-methacrylate (PMMA) added to barium for opacification. Both cannulas were later removed. The access sites were cleaned and covered with a sterile bandage. IMPRESSION: Successful  fluoroscopy-guided bilateral transpedicular approach for T11 vertebral body core bone biopsy and kyphoplasty for treatment of osteoporotic fragility fracture. Bone sample obtained was sent for pathology analysis. Electronically Signed   By: Pedro Earls M.D.   On: 09/06/2019 16:03   CT Maxillofacial Wo Contrast  Result Date: 09/09/2019 CLINICAL DATA:  [place] from bed EXAM: CT MAXILLOFACIAL WITHOUT CONTRAST TECHNIQUE: Multidetector CT imaging of the maxillofacial structures was performed. Multiplanar CT image reconstructions were also generated. COMPARISON:  None. FINDINGS: Osseous: No fracture or mandibular dislocation. No destructive process. Orbits: Negative. No traumatic or inflammatory finding. Sinuses: Clear. Soft tissues: There is mucosal thickening within the left nasal passage. Remaining soft tissues are unremarkable. Limited intracranial: No significant or unexpected finding. IMPRESSION: 1. Mucosal thickening left nasal passage. 2. No acute displaced fracture. Electronically Signed   By: Randa Ngo M.D.   On: 09/09/2019  21:46    Discharge Exam:    Vitals:   09/19/19 0818 09/19/19 1544 09/19/19 2327 09/20/19 0749  BP: (!) 111/38 (!) 128/50 (!) 122/53 (!) 129/58  Pulse: 77 81 79 76  Resp: 18 18  20   Temp: 97.7 F (36.5 C) 98.7 F (37.1 C) 98.2 F (36.8 C) 98.6 F (37 C)  TempSrc:      SpO2: 100% 100% 98% 99%  Weight:      Height:         Physical Exam  Constitution:  Alert, cooperative, no distress,  Psychiatric: Normal and stable mood and affect, cognition intact,   HEENT: Normocephalic, PERRL, otherwise with in Normal limits  Chest:Chest symmetric Cardio vascular:  S1/S2, RRR, No murmure, No Rubs or Gallops  pulmonary: Clear to auscultation bilaterally, respirations unlabored, negative wheezes / crackles Abdomen: Soft, non-tender, non-distended, bowel sounds,no masses, no organomegaly Muscular skeletal:  Severe generalized weaknesses, Limited exam - in  bed, able to move all 4 extremities, Normal strength,  Neuro: CNII-XII intact. , normal motor and sensation, reflexes intact  Extremities: No pitting edema lower extremities, +2 pulses  Skin: Dry, warm to touch, negative for any Rashes, Wounds: per nursing documentation  Pressure Injury 08/16/19 Sacrum Medial Stage 1 -  Intact skin with non-blanchable redness of a localized area usually over a bony prominence. (Active)  08/16/19 1917  Location: Sacrum  Location Orientation: Medial  Staging: Stage 1 -  Intact skin with non-blanchable redness of a localized area usually over a bony prominence.  Wound Description (Comments):   Present on Admission: Yes       The results of significant diagnostics from this hospitalization (including imaging, microbiology, ancillary and laboratory) are listed below for reference.      Microbiology:   Recent Results (from the past 240 hour(s))  Respiratory Panel by RT PCR (Flu A&B, Covid) - Nasopharyngeal Swab     Status: None   Collection Time: 09/14/19  6:46 PM   Specimen: Nasopharyngeal Swab  Result Value Ref Range Status   SARS Coronavirus 2 by RT PCR NEGATIVE NEGATIVE Final    Comment: (NOTE) SARS-CoV-2 target nucleic acids are NOT DETECTED. The SARS-CoV-2 RNA is generally detectable in upper respiratoy specimens during the acute phase of infection. The lowest concentration of SARS-CoV-2 viral copies this assay can detect is 131 copies/mL. A negative result does not preclude SARS-Cov-2 infection and should not be used as the sole basis for treatment or other patient management decisions. A negative result may occur with  improper specimen collection/handling, submission of specimen other than nasopharyngeal swab, presence of viral mutation(s) within the areas targeted by this assay, and inadequate number of viral copies (<131 copies/mL). A negative result must be combined with clinical observations, patient history, and epidemiological  information. The expected result is Negative. Fact Sheet for Patients:  PinkCheek.be Fact Sheet for Healthcare Providers:  GravelBags.it This test is not yet ap proved or cleared by the [place] FDA and  has been authorized for detection and/or diagnosis of SARS-CoV-2 by FDA under an Emergency Use Authorization (EUA). This EUA will remain  in effect (meaning this test can be used) for the duration of the COVID-19 declaration under Section 564(b)(1) of the Act, 21 U.S.C. section 360bbb-3(b)(1), unless the authorization is terminated or revoked sooner.    Influenza A by PCR NEGATIVE NEGATIVE Final   Influenza B by PCR NEGATIVE NEGATIVE Final    Comment: (NOTE) The Xpert Xpress SARS-CoV-2/FLU/RSV assay is intended as an aid in  the diagnosis of influenza from Nasopharyngeal swab specimens and  should not be used as a sole basis for treatment. Nasal washings and  aspirates are unacceptable for Xpert Xpress SARS-CoV-2/FLU/RSV  testing. Fact Sheet for Patients: PinkCheek.be Fact Sheet for Healthcare Providers: GravelBags.it This test is not yet approved or cleared by the [place] FDA and  has been authorized for detection and/or diagnosis of SARS-CoV-2 by  FDA under an Emergency Use Authorization (EUA). This EUA will remain  in effect (meaning this test can be used) for the duration of the  Covid-19 declaration under Section 564(b)(1) of the Act, 21  U.S.C. section 360bbb-3(b)(1), unless the authorization is  terminated or revoked. Performed at [facility], [place] [address] [place], [place] [address]   Culture, blood (Routine X 2) w Reflex to ID Panel     Status: None   Collection Time: 09/15/19  6:42 AM   Specimen: BLOOD LEFT HAND  Result Value Ref Range Status   Specimen Description BLOOD LEFT HAND  Final   Special Requests   Final    BOTTLES DRAWN  AEROBIC ONLY Blood Culture results may not be optimal due to an inadequate volume of blood received in culture bottles   Culture   Final    NO GROWTH 5 DAYS Performed at [facility], [place] [address], [place], [place] [address]    Report Status 09/20/2019 FINAL  Final  Culture, blood (Routine X 2) w Reflex to ID Panel     Status: None   Collection Time: 09/15/19 10:00 AM   Specimen: BLOOD LEFT HAND  Result Value Ref Range Status   Specimen Description BLOOD LEFT HAND  Final   Special Requests   Final    BOTTLES DRAWN AEROBIC AND ANAEROBIC Blood Culture adequate volume   Culture   Final    NO GROWTH 5 DAYS Performed at [facility], [place] [address], [place], [place] [address]    Report Status 09/20/2019 FINAL  Final  SARS CORONAVIRUS 2 (TAT 6-24 HRS) Nasopharyngeal Nasopharyngeal Swab     Status: None   Collection Time: 09/18/19  8:07 AM   Specimen: Nasopharyngeal Swab  Result Value Ref Range Status   SARS Coronavirus 2 NEGATIVE NEGATIVE Final    Comment: (NOTE) SARS-CoV-2 target nucleic acids are NOT DETECTED. The SARS-CoV-2 RNA is generally detectable in upper and lower respiratory specimens during the acute phase of infection. Negative results do not preclude SARS-CoV-2 infection, do not rule out co-infections with other pathogens, and should not be used as the sole basis for treatment or other patient management decisions. Negative results must be combined with clinical observations, patient history, and epidemiological information. The expected result is Negative. Fact Sheet for Patients: SugarRoll.be Fact Sheet for Healthcare Providers: https://www.woods-mathews.com/ This test is not yet approved or cleared by the [place] FDA and  has been authorized for detection and/or diagnosis of SARS-CoV-2 by FDA under an Emergency Use Authorization (EUA). This EUA will remain  in effect (meaning this test can be used) for the  duration of the COVID-19 declaration under Section 56 4(b)(1) of the Act, 21 U.S.C. section 360bbb-3(b)(1), unless the authorization is terminated or revoked sooner. Performed at [facility], [place] [address], [place],  [address]      Labs:   CBC: Recent Labs  Lab 09/14/19 1624 09/15/19 0959 09/16/19 0314 09/17/19 0508 09/18/19 0354  WBC 15.4* 10.9* 10.7* 8.7 10.6*  HGB 11.8* 10.3* 10.5* 9.0* 9.4*  HCT 37.5 33.2* 32.9* 28.5* 29.8*  MCV 98.7 98.2 96.8 95.6 94.9  PLT 259 222 245 216 168   Basic Metabolic Panel: Recent Labs  Lab 09/14/19 1624 09/15/19 0959 09/16/19 0314 09/17/19 0508 09/18/19 0354  NA 140 139 137 137 136  K 3.7 3.6 4.6 3.5 3.4*  CL 97* 96* 97* 98 97*  CO2 28 30 27 29 27   GLUCOSE 92 94 71 83 80  BUN 11 15 22 10 17   CREATININE 3.11* 3.91* 4.70* 2.28* 3.74*  CALCIUM 7.9* 8.3* 8.3* 8.0* 8.1*   Liver Function Tests: Recent Labs  Lab 09/14/19 1624 09/15/19 0959 09/16/19 0314 09/17/19 0508  AST 21 18 25 20   ALT 6 9 6  <5  ALKPHOS 104 89 85 70  BILITOT 0.5 0.7 1.4* 0.7  PROT 6.1* 5.8* 5.6* 4.8*  ALBUMIN 2.3* 2.1* 2.0* 1.7*   BNP (last 3 results) Recent Labs    03/24/19 1040 08/11/19 0929 08/16/19 0758  BNP 2,088.4* 922.1* 1,660.5*   Cardiac Enzymes: No results for input(s): CKTOTAL, CKMB, CKMBINDEX, TROPONINI in the last 168 hours. CBG: Recent Labs  Lab 09/14/19 2121  GLUCAP 88   Urinalysis    Component Value Date/Time   COLORURINE YELLOW 01/20/2017 1737   APPEARANCEUR CLEAR 01/20/2017 1737   LABSPEC 1.012 01/20/2017 1737   PHURINE 5.0 01/20/2017 1737   GLUCOSEU NEGATIVE 01/20/2017 1737   HGBUR NEGATIVE 01/20/2017 1737   BILIRUBINUR NEGATIVE 01/20/2017 1737   BILIRUBINUR negative 09/24/2016 1453   KETONESUR NEGATIVE 01/20/2017 1737   PROTEINUR 30 (A) 01/20/2017 1737   UROBILINOGEN 0.2 09/24/2016 1453   NITRITE NEGATIVE 01/20/2017 1737   LEUKOCYTESUR NEGATIVE 01/20/2017 1737   Pressure Injury 08/16/19 Sacrum  Medial Stage 1 -  Intact skin with non-blanchable redness of a localized area usually over a bony prominence. (Active)  08/16/19 1917  Location: Sacrum  Location Orientation: Medial  Staging: Stage 1 -  Intact skin with non-blanchable redness of a localized area usually over a bony prominence.  Wound Description (Comments):   Present on Admission: Yes

## 2019-09-21 ENCOUNTER — Telehealth: Payer: Self-pay

## 2019-09-21 DIAGNOSIS — N186 End stage renal disease: Secondary | ICD-10-CM | POA: Diagnosis not present

## 2019-09-21 DIAGNOSIS — D509 Iron deficiency anemia, unspecified: Secondary | ICD-10-CM | POA: Diagnosis not present

## 2019-09-21 DIAGNOSIS — Z992 Dependence on renal dialysis: Secondary | ICD-10-CM | POA: Diagnosis not present

## 2019-09-21 DIAGNOSIS — I5041 Acute combined systolic (congestive) and diastolic (congestive) heart failure: Secondary | ICD-10-CM | POA: Diagnosis not present

## 2019-09-21 DIAGNOSIS — D631 Anemia in chronic kidney disease: Secondary | ICD-10-CM | POA: Diagnosis not present

## 2019-09-21 DIAGNOSIS — I480 Paroxysmal atrial fibrillation: Secondary | ICD-10-CM | POA: Diagnosis not present

## 2019-09-21 DIAGNOSIS — E876 Hypokalemia: Secondary | ICD-10-CM | POA: Diagnosis not present

## 2019-09-21 DIAGNOSIS — E8770 Fluid overload, unspecified: Secondary | ICD-10-CM | POA: Diagnosis not present

## 2019-09-21 DIAGNOSIS — D689 Coagulation defect, unspecified: Secondary | ICD-10-CM | POA: Diagnosis not present

## 2019-09-21 DIAGNOSIS — N2581 Secondary hyperparathyroidism of renal origin: Secondary | ICD-10-CM | POA: Diagnosis not present

## 2019-09-21 DIAGNOSIS — D638 Anemia in other chronic diseases classified elsewhere: Secondary | ICD-10-CM | POA: Diagnosis not present

## 2019-09-21 NOTE — Telephone Encounter (Signed)
Daughter Valerie Terrell calling about her mother Valerie Terrell ([place]) She was placed in [facility].  Daughter is asking for Dr. Anitra Lauth to right prescription for oxygen tank for patient to be home. Long story, daughter said she would talk to Dr. Anitra Lauth if needed to discuss in detail  [phone].

## 2019-09-22 NOTE — Telephone Encounter (Signed)
Spoke with patient and daughter, [name].   Advised oxygen supplies/orders will need to be completed by provider at [facility].  [name] states patient is "checking herself out" of facility today d/t wanting to be at home. She also reports patient has increased confusion at night.  Spoke with patient, strongly encouraged patient to stay in facility at least through the weekend for PT/Rehab purposes. She verbalized understanding and will consider staying.   Will contact [name] on Monday (09/25/19), to schedule f/u with PCP.

## 2019-09-22 NOTE — Telephone Encounter (Signed)
noted 

## 2019-09-23 ENCOUNTER — Emergency Department (HOSPITAL_COMMUNITY): Payer: Medicare Other

## 2019-09-23 ENCOUNTER — Observation Stay (HOSPITAL_COMMUNITY)
Admission: EM | Admit: 2019-09-23 | Discharge: 2019-09-25 | Disposition: A | Discharge status: Home / Self Care | Payer: Medicare Other | Attending: Internal Medicine | Admitting: Internal Medicine

## 2019-09-23 ENCOUNTER — Other Ambulatory Visit: Payer: Self-pay

## 2019-09-23 ENCOUNTER — Encounter (HOSPITAL_COMMUNITY): Payer: Self-pay | Admitting: *Deleted

## 2019-09-23 DIAGNOSIS — Z7982 Long term (current) use of aspirin: Secondary | ICD-10-CM | POA: Insufficient documentation

## 2019-09-23 DIAGNOSIS — G8929 Other chronic pain: Secondary | ICD-10-CM | POA: Diagnosis present

## 2019-09-23 DIAGNOSIS — Z8673 Personal history of transient ischemic attack (TIA), and cerebral infarction without residual deficits: Secondary | ICD-10-CM | POA: Insufficient documentation

## 2019-09-23 DIAGNOSIS — E785 Hyperlipidemia, unspecified: Secondary | ICD-10-CM | POA: Diagnosis present

## 2019-09-23 DIAGNOSIS — Z87891 Personal history of nicotine dependence: Secondary | ICD-10-CM | POA: Diagnosis not present

## 2019-09-23 DIAGNOSIS — I495 Sick sinus syndrome: Secondary | ICD-10-CM | POA: Insufficient documentation

## 2019-09-23 DIAGNOSIS — Z79899 Other long term (current) drug therapy: Secondary | ICD-10-CM | POA: Insufficient documentation

## 2019-09-23 DIAGNOSIS — Z885 Allergy status to narcotic agent status: Secondary | ICD-10-CM | POA: Diagnosis not present

## 2019-09-23 DIAGNOSIS — I480 Paroxysmal atrial fibrillation: Secondary | ICD-10-CM | POA: Diagnosis not present

## 2019-09-23 DIAGNOSIS — Z7189 Other specified counseling: Secondary | ICD-10-CM

## 2019-09-23 DIAGNOSIS — M25519 Pain in unspecified shoulder: Secondary | ICD-10-CM | POA: Diagnosis not present

## 2019-09-23 DIAGNOSIS — R627 Adult failure to thrive: Secondary | ICD-10-CM | POA: Diagnosis present

## 2019-09-23 DIAGNOSIS — N186 End stage renal disease: Secondary | ICD-10-CM

## 2019-09-23 DIAGNOSIS — J9611 Chronic respiratory failure with hypoxia: Secondary | ICD-10-CM | POA: Diagnosis not present

## 2019-09-23 DIAGNOSIS — Z882 Allergy status to sulfonamides status: Secondary | ICD-10-CM | POA: Insufficient documentation

## 2019-09-23 DIAGNOSIS — Z66 Do not resuscitate: Secondary | ICD-10-CM | POA: Insufficient documentation

## 2019-09-23 DIAGNOSIS — I132 Hypertensive heart and chronic kidney disease with heart failure and with stage 5 chronic kidney disease, or end stage renal disease: Secondary | ICD-10-CM | POA: Diagnosis not present

## 2019-09-23 DIAGNOSIS — D631 Anemia in chronic kidney disease: Secondary | ICD-10-CM | POA: Diagnosis not present

## 2019-09-23 DIAGNOSIS — Z950 Presence of cardiac pacemaker: Secondary | ICD-10-CM | POA: Insufficient documentation

## 2019-09-23 DIAGNOSIS — J90 Pleural effusion, not elsewhere classified: Secondary | ICD-10-CM | POA: Diagnosis not present

## 2019-09-23 DIAGNOSIS — Z515 Encounter for palliative care: Secondary | ICD-10-CM

## 2019-09-23 DIAGNOSIS — N2581 Secondary hyperparathyroidism of renal origin: Secondary | ICD-10-CM | POA: Diagnosis not present

## 2019-09-23 DIAGNOSIS — K5730 Diverticulosis of large intestine without perforation or abscess without bleeding: Secondary | ICD-10-CM | POA: Diagnosis not present

## 2019-09-23 DIAGNOSIS — R079 Chest pain, unspecified: Secondary | ICD-10-CM | POA: Diagnosis present

## 2019-09-23 DIAGNOSIS — I5042 Chronic combined systolic (congestive) and diastolic (congestive) heart failure: Secondary | ICD-10-CM | POA: Diagnosis not present

## 2019-09-23 DIAGNOSIS — Z992 Dependence on renal dialysis: Secondary | ICD-10-CM | POA: Diagnosis not present

## 2019-09-23 DIAGNOSIS — Z888 Allergy status to other drugs, medicaments and biological substances status: Secondary | ICD-10-CM | POA: Insufficient documentation

## 2019-09-23 DIAGNOSIS — M549 Dorsalgia, unspecified: Secondary | ICD-10-CM | POA: Insufficient documentation

## 2019-09-23 DIAGNOSIS — Z20822 Contact with and (suspected) exposure to covid-19: Secondary | ICD-10-CM | POA: Insufficient documentation

## 2019-09-23 DIAGNOSIS — J189 Pneumonia, unspecified organism: Secondary | ICD-10-CM | POA: Diagnosis not present

## 2019-09-23 DIAGNOSIS — Z743 Need for continuous supervision: Secondary | ICD-10-CM | POA: Diagnosis not present

## 2019-09-23 DIAGNOSIS — Z9981 Dependence on supplemental oxygen: Secondary | ICD-10-CM | POA: Diagnosis not present

## 2019-09-23 DIAGNOSIS — R0789 Other chest pain: Secondary | ICD-10-CM | POA: Diagnosis not present

## 2019-09-23 DIAGNOSIS — K8020 Calculus of gallbladder without cholecystitis without obstruction: Secondary | ICD-10-CM | POA: Diagnosis not present

## 2019-09-23 LAB — RESPIRATORY PANEL BY RT PCR (FLU A&B, COVID)
Influenza A by PCR: NEGATIVE
Influenza B by PCR: NEGATIVE
SARS Coronavirus 2 by RT PCR: NEGATIVE

## 2019-09-23 LAB — COMPREHENSIVE METABOLIC PANEL
ALT: 10 U/L (ref 0–44)
AST: 19 U/L (ref 15–41)
Albumin: 2 g/dL — ABNORMAL LOW (ref 3.5–5.0)
Alkaline Phosphatase: 89 U/L (ref 38–126)
Anion gap: 11 (ref 5–15)
BUN: 15 mg/dL (ref 8–23)
CO2: 31 mmol/L (ref 22–32)
Calcium: 8.8 mg/dL — ABNORMAL LOW (ref 8.9–10.3)
Chloride: 93 mmol/L — ABNORMAL LOW (ref 98–111)
Creatinine, Ser: 4.41 mg/dL — ABNORMAL HIGH (ref 0.44–1.00)
GFR calc Af Amer: 10 mL/min — ABNORMAL LOW (ref >60)
GFR calc non Af Amer: 9 mL/min — ABNORMAL LOW (ref >60)
Glucose, Bld: 91 mg/dL (ref 70–99)
Potassium: 4.8 mmol/L (ref 3.5–5.1)
Sodium: 135 mmol/L (ref 135–145)
Total Bilirubin: 0.6 mg/dL (ref 0.3–1.2)
Total Protein: 5.9 g/dL — ABNORMAL LOW (ref 6.5–8.1)

## 2019-09-23 LAB — TROPONIN I (HIGH SENSITIVITY)
Troponin I (High Sensitivity): 49 ng/L — ABNORMAL HIGH (ref <18)
Troponin I (High Sensitivity): 50 ng/L — ABNORMAL HIGH (ref <18)

## 2019-09-23 LAB — CBC WITH DIFFERENTIAL/PLATELET
Abs Immature Granulocytes: 0.15 10*3/uL — ABNORMAL HIGH (ref 0.00–0.07)
Basophils Absolute: 0.1 10*3/uL (ref 0.0–0.1)
Basophils Relative: 0 %
Eosinophils Absolute: 0.1 10*3/uL (ref 0.0–0.5)
Eosinophils Relative: 1 %
HCT: 35.3 % — ABNORMAL LOW (ref 36.0–46.0)
Hemoglobin: 10.9 g/dL — ABNORMAL LOW (ref 12.0–15.0)
Immature Granulocytes: 1 %
Lymphocytes Relative: 3 %
Lymphs Abs: 0.5 10*3/uL — ABNORMAL LOW (ref 0.7–4.0)
MCH: 31.1 pg (ref 26.0–34.0)
MCHC: 30.9 g/dL (ref 30.0–36.0)
MCV: 100.6 fL — ABNORMAL HIGH (ref 80.0–100.0)
Monocytes Absolute: 1.1 10*3/uL — ABNORMAL HIGH (ref 0.1–1.0)
Monocytes Relative: 8 %
Neutro Abs: 12.3 10*3/uL — ABNORMAL HIGH (ref 1.7–7.7)
Neutrophils Relative %: 87 %
Platelets: 289 10*3/uL (ref 150–400)
RBC: 3.51 MIL/uL — ABNORMAL LOW (ref 3.87–5.11)
RDW: 17.5 % — ABNORMAL HIGH (ref 11.5–15.5)
WBC: 14.1 10*3/uL — ABNORMAL HIGH (ref 4.0–10.5)
nRBC: 0 % (ref 0.0–0.2)

## 2019-09-23 LAB — LIPASE, BLOOD: Lipase: 104 U/L — ABNORMAL HIGH (ref 11–51)

## 2019-09-23 LAB — PROCALCITONIN: Procalcitonin: 0.79 ng/mL

## 2019-09-23 MED ORDER — ALTEPLASE 2 MG IJ SOLR: 2.0000 mg | Freq: Once | INTRAMUSCULAR | Status: DC | PRN

## 2019-09-23 MED ORDER — LIDOCAINE HCL (PF) 1 % IJ SOLN
5.0000 mL | INTRAMUSCULAR | Status: DC | PRN
  Filled 2019-09-23: qty 5

## 2019-09-23 MED ORDER — POLYVINYL ALCOHOL 1.4 % OP SOLN
2.0000 [drp] | Freq: Every day | OPHTHALMIC | Status: DC | PRN
  Administered 2019-09-25: 2 [drp] via OPHTHALMIC
  Filled 2019-09-23: qty 15

## 2019-09-23 MED ORDER — SODIUM CHLORIDE 0.9 % IV SOLN: 100.0000 mL | INTRAVENOUS | Status: DC | PRN

## 2019-09-23 MED ORDER — LIDOCAINE-PRILOCAINE 2.5-2.5 % EX CREA
1.0000 "application " | TOPICAL_CREAM | CUTANEOUS | Status: DC
  Filled 2019-09-23: qty 5

## 2019-09-23 MED ORDER — CALCITONIN (SALMON) 200 UNIT/ACT NA SOLN
1.0000 | Freq: Every day | NASAL | Status: DC
  Administered 2019-09-25: 1 via NASAL
  Filled 2019-09-23: qty 3.7

## 2019-09-23 MED ORDER — ONDANSETRON HCL 4 MG/2ML IJ SOLN: 4.0000 mg | Freq: Four times a day (QID) | INTRAMUSCULAR | Status: DC | PRN

## 2019-09-23 MED ORDER — SEVELAMER CARBONATE 800 MG PO TABS
1600.0000 mg | ORAL_TABLET | Freq: Three times a day (TID) | ORAL | Status: DC
  Administered 2019-09-24: 800 mg via ORAL
  Filled 2019-09-23 (×5): qty 2

## 2019-09-23 MED ORDER — HEPARIN SODIUM (PORCINE) 1000 UNIT/ML DIALYSIS
1400.0000 [IU] | INTRAMUSCULAR | Status: DC | PRN
  Filled 2019-09-23: qty 2

## 2019-09-23 MED ORDER — ONDANSETRON HCL 4 MG PO TABS: 4.0000 mg | ORAL_TABLET | Freq: Four times a day (QID) | ORAL | Status: DC | PRN

## 2019-09-23 MED ORDER — OXYCODONE HCL 5 MG PO TABS
20.0000 mg | ORAL_TABLET | Freq: Four times a day (QID) | ORAL | Status: DC | PRN
  Administered 2019-09-23: 20 mg via ORAL
  Filled 2019-09-23: qty 4

## 2019-09-23 MED ORDER — SODIUM FLUORIDE 1.1 % DT CREA: 1.0000 "application " | TOPICAL_CREAM | Freq: Every day | DENTAL | Status: DC

## 2019-09-23 MED ORDER — PENTAFLUOROPROP-TETRAFLUOROETH EX AERO
1.0000 "application " | INHALATION_SPRAY | CUTANEOUS | Status: DC | PRN
  Filled 2019-09-23: qty 116

## 2019-09-23 MED ORDER — HYDROXYZINE HCL 25 MG PO TABS: 25.0000 mg | ORAL_TABLET | Freq: Three times a day (TID) | ORAL | Status: DC | PRN

## 2019-09-23 MED ORDER — HEPARIN SODIUM (PORCINE) 5000 UNIT/ML IJ SOLN
5000.0000 [IU] | Freq: Three times a day (TID) | INTRAMUSCULAR | Status: DC
  Administered 2019-09-23 – 2019-09-25 (×6): 5000 [IU] via SUBCUTANEOUS
  Filled 2019-09-23 (×7): qty 1

## 2019-09-23 MED ORDER — LIDOCAINE-PRILOCAINE 2.5-2.5 % EX CREA
1.0000 "application " | TOPICAL_CREAM | CUTANEOUS | Status: DC | PRN
  Filled 2019-09-23: qty 5

## 2019-09-23 MED ORDER — CALCIUM CARBONATE ANTACID 1250 MG/5ML PO SUSP
500.0000 mg | Freq: Four times a day (QID) | ORAL | Status: DC | PRN
  Filled 2019-09-23: qty 5

## 2019-09-23 MED ORDER — ISOSORBIDE MONONITRATE ER 60 MG PO TB24
60.0000 mg | ORAL_TABLET | Freq: Every day | ORAL | Status: DC
  Administered 2019-09-24: 60 mg via ORAL
  Filled 2019-09-23: qty 1

## 2019-09-23 MED ORDER — CHLORHEXIDINE GLUCONATE CLOTH 2 % EX PADS
6.0000 | MEDICATED_PAD | Freq: Every day | CUTANEOUS | Status: DC
  Administered 2019-09-24: 6 via TOPICAL

## 2019-09-23 MED ORDER — HEPARIN SODIUM (PORCINE) 1000 UNIT/ML DIALYSIS
1000.0000 [IU] | INTRAMUSCULAR | Status: DC | PRN
  Filled 2019-09-23: qty 1

## 2019-09-23 MED ORDER — GUAIFENESIN ER 600 MG PO TB12
600.0000 mg | ORAL_TABLET | Freq: Every day | ORAL | Status: DC
  Administered 2019-09-24 – 2019-09-25 (×2): 600 mg via ORAL
  Filled 2019-09-23 (×2): qty 1

## 2019-09-23 MED ORDER — DOCUSATE SODIUM 283 MG RE ENEM
1.0000 | ENEMA | RECTAL | Status: DC | PRN
  Filled 2019-09-23: qty 1

## 2019-09-23 MED ORDER — FENTANYL CITRATE (PF) 100 MCG/2ML IJ SOLN
50.0000 ug | Freq: Once | INTRAMUSCULAR | Status: AC
  Administered 2019-09-23: 50 ug via INTRAVENOUS
  Filled 2019-09-23: qty 2

## 2019-09-23 MED ORDER — ZOLPIDEM TARTRATE 5 MG PO TABS
5.0000 mg | ORAL_TABLET | Freq: Every evening | ORAL | Status: DC | PRN
  Administered 2019-09-23: 5 mg via ORAL
  Filled 2019-09-23: qty 1

## 2019-09-23 MED ORDER — OXYCODONE HCL 5 MG PO TABS
ORAL_TABLET | ORAL | Status: AC
  Filled 2019-09-23: qty 4

## 2019-09-23 MED ORDER — AMIODARONE HCL 200 MG PO TABS
200.0000 mg | ORAL_TABLET | Freq: Every day | ORAL | Status: DC
  Administered 2019-09-24: 200 mg via ORAL
  Filled 2019-09-23 (×2): qty 1

## 2019-09-23 MED ORDER — DOXERCALCIFEROL 4 MCG/2ML IV SOLN
INTRAVENOUS | Status: AC
  Administered 2019-09-23: 21:00:00 2 ug
  Filled 2019-09-23: qty 2

## 2019-09-23 MED ORDER — NEPRO/CARBSTEADY PO LIQD
237.0000 mL | Freq: Three times a day (TID) | ORAL | Status: DC | PRN
  Filled 2019-09-23: qty 237

## 2019-09-23 MED ORDER — ACETAMINOPHEN 650 MG RE SUPP: 650.0000 mg | Freq: Four times a day (QID) | RECTAL | Status: DC | PRN

## 2019-09-23 MED ORDER — ALPRAZOLAM 0.5 MG PO TABS
ORAL_TABLET | ORAL | Status: AC
  Filled 2019-09-23: qty 1

## 2019-09-23 MED ORDER — SORBITOL 70 % SOLN
30.0000 mL | Status: DC | PRN
  Filled 2019-09-23: qty 30

## 2019-09-23 MED ORDER — ACETAMINOPHEN 325 MG PO TABS
650.0000 mg | ORAL_TABLET | Freq: Four times a day (QID) | ORAL | Status: DC | PRN
  Administered 2019-09-24: 650 mg via ORAL
  Filled 2019-09-23 (×2): qty 2

## 2019-09-23 MED ORDER — DOXERCALCIFEROL 2.5 MCG PO CAPS
2.5000 ug | ORAL_CAPSULE | ORAL | Status: DC
  Filled 2019-09-23: qty 1

## 2019-09-23 MED ORDER — PRAVASTATIN SODIUM 40 MG PO TABS
40.0000 mg | ORAL_TABLET | Freq: Every day | ORAL | Status: DC
  Administered 2019-09-24: 40 mg via ORAL
  Filled 2019-09-23: qty 1

## 2019-09-23 MED ORDER — CAMPHOR-MENTHOL 0.5-0.5 % EX LOTN
1.0000 "application " | TOPICAL_LOTION | Freq: Three times a day (TID) | CUTANEOUS | Status: DC | PRN
  Filled 2019-09-23: qty 222

## 2019-09-23 MED ORDER — ASPIRIN EC 325 MG PO TBEC
325.0000 mg | DELAYED_RELEASE_TABLET | Freq: Every day | ORAL | Status: DC
  Administered 2019-09-24: 325 mg via ORAL
  Filled 2019-09-23: qty 1

## 2019-09-23 NOTE — Progress Notes (Addendum)
[facility] Progress Note   Subjective:   Patient seen and examined at bedside.  Multiple back to back admission most recently d/c to SNF following admission 4/29 - 5/5 for PNA.  Presented today due to worsening pain.  Patient answering few questions.  Reports L sided back pain.  Daughter states she went to SNF to check on her and she was laying in bed "miserable" complaining of chest pain.  Currently denies CP, SOB, n/v/d, fall or trauma.  Per daughter yesterday she participated in PT/OT and had a good day.    Objective Vitals:   09/23/19 1230 09/23/19 1400 09/23/19 1430 09/23/19 1500  BP: (!) 152/62 133/60 (!) 146/71 (!) 140/55  Pulse: 82 75 87 78  Resp: 14 13 (!) 24 19  Temp:      TempSrc:      SpO2: 100% 96% 100% 100%  Weight:      Height:       Physical Exam General:chronically ill appearing elderly female in NAD Heart:RRR Lungs:Clear on R, BS decreased on L, Back: +tenderness to palpation on L middle back Abdomen:soft, NTND Extremities:trace to 1+ LE edema Dialysis Access: AVG   [facility] Weights   09/23/19 0955  Weight: 51 kg   No intake or output data in the 24 hours ending 09/23/19 1525  Additional Objective Labs: Basic Metabolic Panel: Recent Labs  Lab 09/17/19 0508 09/18/19 0354 09/23/19 1019  NA 137 136 135  K 3.5 3.4* 4.8  CL 98 97* 93*  CO2 29 27 31   GLUCOSE 83 80 91  BUN 10 17 15   CREATININE 2.28* 3.74* 4.41*  CALCIUM 8.0* 8.1* 8.8*   Liver Function Tests: Recent Labs  Lab 09/17/19 0508 09/23/19 1019  AST 20 19  ALT <5 10  ALKPHOS 70 89  BILITOT 0.7 0.6  PROT 4.8* 5.9*  ALBUMIN 1.7* 2.0*   Recent Labs  Lab 09/23/19 1019  LIPASE 104*   Studies/Results: CT Abdomen Pelvis Wo Contrast  Result Date: 09/23/2019 CLINICAL DATA:  Left chest pain and left shoulder pain. History of recently treated pneumonia. Patient on dialysis. EXAM: CT ABDOMEN AND PELVIS WITHOUT CONTRAST TECHNIQUE: Multidetector CT imaging of the abdomen and  pelvis was performed following the standard protocol without IV contrast. COMPARISON:  August 31, 2019 FINDINGS: Lower chest: Enlarged heart. Calcific atherosclerotic disease of the coronary arteries and aorta. Bilateral small pleural effusions. Left greater than right lung base airspace consolidation versus atelectasis. Hepatobiliary: No focal liver abnormality is seen. Layering gallbladder sludge/stones without secondary signs of acute cholecystitis. Pancreas: Unremarkable. No pancreatic ductal dilatation or surrounding inflammatory changes. Spleen: Normal in size without focal abnormality. Adrenals/Urinary Tract: Adrenal glands are unremarkable. Atrophic kidneys. Bilateral renal cysts. Stomach/Bowel: Stomach is within normal limits. No evidence of bowel wall thickening, distention, or inflammatory changes. Diffuse colonic diverticulosis without evidence of acute diverticulitis. Vascular/Lymphatic: Severe atherosclerosis and ectasia of the abdominal aorta. No enlarged abdominal or pelvic lymph nodes. Reproductive: Status post hysterectomy. No adnexal masses. Other: No abdominal wall hernia or abnormality. No abdominopelvic ascites. Musculoskeletal: Chronic T12 compression fracture post interval kyphoplasty. Chronic T9 compression fracture post kyphoplasty without significant alignment changes. IMPRESSION: 1. Bilateral small pleural effusions. Left greater than right lung base infectious consolidation versus atelectasis. 2. Cholelithiasis without secondary signs of acute cholecystitis. 3. Atrophic kidneys consistent with chronic renal insufficiency. 4. Diffuse colonic diverticulosis without evidence of acute diverticulitis. 5. Severe atherosclerosis and ectasia of the abdominal aorta. 6. Post interval kyphoplasty of T12 compression fracture without significant alignment  changes. Aortic Atherosclerosis (ICD10-I70.0). Electronically Signed   By: Fidela Salisbury M.D.   On: 09/23/2019 11:47   DG Chest Port 1  View  Result Date: 09/23/2019 CLINICAL DATA:  Chest pain, recent pneumonia. EXAM: PORTABLE CHEST 1 VIEW COMPARISON:  Prior chest radiographs 09/14/2019 and earlier FINDINGS: The cardiac silhouette is partially obscured, limiting evaluation of heart size. Aortic atherosclerosis. Redemonstrated opacity within the left mid to lower lung field consistent with pleural effusion with atelectasis and/or consolidation. Background interstitial prominence may reflect interstitial edema. No right pleural effusion or evidence of pneumothorax. Redemonstrated left chest pacer device. No acute bony abnormality identified. Sequela of prior vertebral augmentation at multiple levels. IMPRESSION: Increased opacity within the left mid to lower lung field consistent with pleural effusion with underlying atelectasis and/or consolidation. Radiographic follow-up to resolution is recommended. Unchanged cardiomegaly with probable mild background interstitial edema. Electronically Signed   By: Kellie Simmering DO   On: 09/23/2019 11:13    Medications:  . [START ON 09/24/2019] amiodarone  200 mg Oral Daily  . aspirin EC  325 mg Oral QHS  . calcitonin (salmon)  1 spray Alternating Nares Daily  . [START ON 09/24/2019] Chlorhexidine Gluconate Cloth  6 each Topical Q0600  . doxercalciferol  2.5 mcg Oral See admin instructions  . guaiFENesin  600 mg Oral Daily  . heparin  5,000 Units Subcutaneous Q8H  . isosorbide mononitrate  60 mg Oral See admin instructions  . lidocaine-prilocaine  1 application Topical See admin instructions  . pravastatin  40 mg Oral QHS  . sevelamer carbonate  1,600 mg Oral TID WC  . sodium fluoride  1 application dental QHS    Dialysis Orders: [facility] - MTTS AVG 4hrs 350/600 2K 2Ca  Hectorol 43mcg Hep 1400 units mircera 60 q2wks - not given   Assessment/Plan: 1. FTT - multiple recent admission/ER, palliative care consult ordered 2. L sided chest pain - CXR/CT w/slightly larger pleural effusion. Per PCCM  unlikely to benefit from throa. ?worsening PNA. Per primary  3. Back pain/Hx T11 compression fracture - complaining of back pain. Improved with pain meds 2. ESRD - on HD MTTS.  Plan for HD today per regular schedule.  Reassess in AM to see if additional dialysis needed. K 4.8 3. Anemia of CKD- Hgb 10.9.  Aranesp last dosed 5/4. 4. Secondary hyperparathyroidism - Ca in goal. Will check phos.  Continue binders and VDRA. 5. HTN/volume - BP slightly elevated. Volume status improved from usual baseline as OP.  Trace to 1+ edema, slightly larger pleural effusions, net UF goal 2-3L today.   6. Nutrition - Renal diet w/fluid restrictions.   Jen Mow, PA-C [facility] Pager: [phone] 09/23/2019,3:25 PM  LOS: 0 days    Nephrology attending: Patient was seen and examined in ER.  Chart reviewed.  I agree with assessment and plan as outlined above.  86 year old female ESRD on HD, recent hospitalization and discharged to nursing home presented with generalized body pain and failure to thrive.  Plan for dialysis today as per TTS schedule.  Lower UF goal to 1 to 2 kg as patient has poor oral intake. Agree with palliative care consult and discuss goals of care.  She is currently DNR. AV graft for the access.  Katheran James, MD [facility].

## 2019-09-23 NOTE — ED Provider Notes (Signed)
[facility] EMERGENCY DEPARTMENT Provider Note   CSN: [id number] Arrival date & time: 09/23/19  0940     History Chief Complaint  Patient presents with  . Chest Pain    Jonna Dittrich is a 86 y.o. female.  Patient is a 86 year old female with a history of CHF, chronic hypoxic respiratory failure on oxygen at 2 L/min, end-stage renal disease on dialysis Tuesday Thursday Saturday, hypertension, paroxysmal atrial fibrillation and chronic back pain who presents with chest pain.  She was recently treated for pneumonia and discharged to [facility] for rehab.  She was complaining this morning of pain in her left chest.  She says that she is hurting all over.  She cannot really tell me exactly where she is hurting her when it started.  She has not had any recent falls per her daughter.  No recent fevers.  No cough.  No change in her baseline breathing.  She received oxycodone and Tylenol prior to arrival.  Patient is not really answering my questions.  She tells me she hurts in her chest and that she hurts all over but really cannot give me any more information.  History and review of systems is limited due to this.        Past Medical History:  Diagnosis Date  . Anemia of chronic disease 2018   + anemia of CRI?  [name] Atrophy of left kidney    with absent blood flow by renal artery dopplers (Dr. Gwenlyn Found)  . 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 Chronic combined systolic and diastolic CHF (congestive heart failure) ([place])    a. 11/2016: echo showing EF of 35-40%, RV strain noted, mild MR and mild TR.   Echo showed much improved EF 12/2017 (55-60%)  . Chronic respiratory failure with hypoxia ([place]) 02/11/2017   Now on chronic O2 (intermittent as of summer 2019).    . Closed fracture of right superior pubic ramus ([place]) 07/18/2018   Non op mgmt (Dr. Victorino December was ortho who saw her in hosp but i don't think pt followed up with him).  . Closed right hip  fracture ([place]) 10/04/2018   ORIF  . Debilitated patient    WC dependent as of 2018.  Hosp bed + full assistance with most ADLs s/p hip fx surg 09/2018.  [name] ESRD on hemodialysis ([place]) 01/2017   T/Th/Sat schedule- [place].  Right basilic AV fistula 74/2595.  Graft 2019.  [name] History of adenomatous polyp of colon   . History of kidney stones   . History of subarachnoid hemorrhage 10/2014   after syncope and while on xarelto  . Hyperlipidemia   . Hypertension    Difficult to control, in the setting of one functioning kidney: pt was referred to nephrology by Dr. Gwenlyn Found 06/2015.  . Lumbar radiculopathy 2012  . Lumbar spinal stenosis 2019   facet injections only very short term relief.  L1 and L2 selective nerve root blocks helpful 04/2018.  . Lumbar spondylosis    MR 07/2016---no sign of spinal nerve compression or cord compression.  Pt set up with outpt ortho while admitted to hosp 07/2016.  . Malnourished ([place])   . Metatarsal fracture 06/10/2016   Nondisplaced, left 5th metatarsal--pt was referred to ortho  . Osteopenia 2014   T-score -2.1  . PAF (paroxysmal atrial fibrillation) (HCC)    Eliquis started after BRAO and CVA.  She was changed to warfarin 2018. All anticoag stopped due to recurrent falls.  [name]  Presence of permanent cardiac pacemaker   . Right rib fracture 12/2016   s/p fall  . Sick sinus syndrome ([employer]) 11/2016   Dual chamber pacer insertion 2018 (Dr. Lovena Le)  . Stroke ([place]) 67/8938   cardioembolic (had CVA while on no anticoag)--"scattered subacute punctate infarcts: 1 in R parietal lobe and 2 in occipital cortex" on MRI br.  CT angio head/neck: aortic arch athero.  R ICA 20% stenosis, L ICA w/out any stenosis.  . Thoracic back pain 01/2017; 01/2018   2018: Facet?  Dr. Ramos->steroid injection.  01/2018-->CT T spine to check for a new comp fx-->none found.  Selective nerve root inj in T spine helpful on R side.   . Thoracic compression fracture ([place]) 07/2016   T3.  T7 and T8--  T8 kyphoplasty during hosp admission 07/2016.  Neuro referred pt to pain mgmt for consideration of injection 09/2016.  I referred her to endo 08/2016 for consideration of calcitonin treatment.    Patient Active Problem List   Diagnosis Date Noted  . HAP (hospital-acquired pneumonia) 09/14/2019  . Acute encephalopathy 09/14/2019  . Palliative care by specialist   . Goals of care, counseling/discussion   . DNR (do not resuscitate)   . Inadequate pain control   . Compression fracture of T11 vertebra ([place]) 09/01/2019  . Acute diverticulitis 09/01/2019  . Diverticulitis 08/31/2019  . Pressure injury of skin 08/17/2019  . Volume overload 08/16/2019  . Disorder of phosphorus metabolism, unspecified 05/15/2019  . Hypoxemia 03/24/2019  . Anaphylactic shock, unspecified, sequela 02/23/2019  . Heterotopic ossification 12/29/2018  . Pain, unspecified 10/15/2018  . Hip fracture ([place]) 10/04/2018  . Fracture of pubic ramus ([place]) 07/19/2018  . Fracture of sacrum ([place]) 07/19/2018  . Perineal hematoma 07/19/2018  . Fever 07/19/2018  . Acute blood loss anemia 07/19/2018  . ESRD (end stage renal disease) ([place]) 05/20/2018  . Chronic back pain 05/14/2018  . Chronic pain syndrome 05/14/2018  . Iron deficiency anemia, unspecified 04/25/2018  . Hypoxia 12/14/2017  . Dyspnea 12/14/2017  . Secondary hyperparathyroidism of renal origin ([place]) 08/16/2017  . Degeneration of thoracic intervertebral disc 07/26/2017  . Moderate protein-calorie malnutrition ([place]) 02/23/2017  . Acute respiratory failure with hypoxia ([place]) 02/13/2017  . Hyperkalemia 02/13/2017  . ESRD on dialysis ([place]) 02/11/2017  . Chronic respiratory failure with hypoxia ([place]) 02/11/2017  . Weakness generalized 02/07/2017  . Poor tolerance for ambulation 02/07/2017  . Anemia in chronic kidney disease 02/04/2017  . Coagulation defect, unspecified ([place]) 02/04/2017  . Fracture of one rib, right side, initial encounter for closed fracture  01/11/2017  . Closed fracture of one rib of right side   . Chronic combined systolic and diastolic heart failure ([place]) 01/05/2017  . Palpitations 12/15/2016  . Hypokalemia 12/14/2016  . Leukocytosis 12/14/2016  . Long term (current) use of anticoagulants [Z79.01] 12/11/2016  . Non-ischemic cardiomyopathy ([place]) 12/07/2016  . Acute combined systolic and diastolic heart failure ([place])  12/07/2016  . Cardiac pacemaker   . Acute on chronic respiratory failure with hypoxia ([place])   . Acute pulmonary edema with congestive heart failure ([place]) 11/30/2016  . Elevated troponin level 11/30/2016  . Anemia 11/30/2016  . Chronic midline thoracic back pain 09/15/2016  . History of CVA (cerebrovascular accident) 09/15/2016  . Hyperlipidemia   . Nausea & vomiting 08/05/2016  . Dehydration 08/05/2016  . Osteoporosis 07/28/2016  . Closed compression fracture of thoracic vertebra ([place]) 07/23/2016  . Thoracic compression fracture ([place]) 07/22/2016  . Flank pain 07/11/2016  . Hypoalbuminemia 07/11/2016  .  Renal artery stenosis ([place]) 06/24/2016  . Nondisplaced fracture of fifth metatarsal bone, left foot, initial encounter for closed fracture 06/10/2016  . Rib contusion, left, initial encounter 06/10/2016  . Single kidney 03/13/2016  . Chest pain   . Dyslipidemia   . PAF (paroxysmal atrial fibrillation) ([place]) 01/28/2016  . Essential hypertension 01/28/2016  . History of cardioembolic stroke 21/19/4174  . Personal history of subarachnoid hemorrhage 01/28/2016  . CKD (chronic kidney disease), stage IV ([place]) 01/28/2016  . Gait disturbance 01/27/2016  . Mitral valve disease 01/08/2014  . Colon polyps 07/10/2013  . Lumbar radiculopathy 02/04/2011    Past Surgical History:  Procedure Laterality Date  . APPENDECTOMY  child  . AV FISTULA PLACEMENT Right 03/31/2017   Procedure: Right ARM Basilic vein transposition;  Surgeon: Conrad Hunker, MD;  Location: [facility] OR;  Service: Vascular;  Laterality: Right;  . AV  FISTULA PLACEMENT Right 12/06/2017   Procedure: INSERTION OF ARTERIOVENOUS (AV) GORE-TEX GRAFT ARM RIGHT UPPER EXTREMITY;  Surgeon: [name], MD;  Location: [facility];  Service: Vascular;  Laterality: Right;  . CARDIOVASCULAR STRESS TEST  01/31/2016   Stress myoview: NORMAL/Low risk.  EF 56%.  . [place]  . COLONOSCOPY  2015   + hx of adenomatous polyps.  Need digest health spec in [place] records to see when pt due for next colonoscopy  . DECLOT CKV Right 03/21/2019  . EYE SURGERY Bilateral    Catarct  . INSERTION OF DIALYSIS CATHETER Right 01/29/2017   Procedure: INSERTION OF DIALYSIS CATHETER- RIGHT INTERNAL JUGULAR;  Surgeon: [name], MD;  Location: [facility];  Service: Vascular;  Laterality: Right;  . INTRAMEDULLARY (IM) NAIL INTERTROCHANTERIC Right 10/04/2018   Procedure: INTRAMEDULLARY (IM) NAIL INTERTROCHANTRIC;  Surgeon: Nicholes Stairs, MD;  Location: [facility];  Service: Orthopedics;  Laterality: Right;  . IR GENERIC HISTORICAL  07/24/2016   IR KYPHO THORACIC WITH BONE BIOPSY 07/24/2016 Luanne Bras, MD [facility]INTER[facility] RAD  . IR KYPHO THORACIC WITH BONE BIOPSY  09/06/2019  . KYPHOPLASTY  07/27/2016   T8  . PACEMAKER IMPLANT N/A 12/03/2016   Procedure: Pacemaker Implant;  Surgeon: Evans Lance, MD;  Location: [facility];  Service: Cardiovascular;  Laterality: N/A;  . Renal artery dopplers  02/26/2016   Her right renal dimension was 11 cm pole to pole with mild to moderate right renal artery stenosis. A right renal aortic ratio was 3.22 suggesting less than a 50% stenosis.  . Right upper arm AV Gore-Tex graft  12/06/2017   Dr. Donnetta Hutching  . TONSILLECTOMY    . TRANSTHORACIC ECHOCARDIOGRAM  01/29/2016; 11/2016; 12/2017   2017: EF 55-60%, normal LV wall motion, grade I DD.  No cardiac source of emboli was seen.  2018: EF 35-40%, could not assess DD due to a-fib, pulm HTN noted. 12/2017 EF 55-60%, nl wall motion, grd II DD, mod MR and pulm regurg, increased pulm pressure  (peak 52).     OB History   No obstetric history on file.     Family History  Problem Relation Age of Onset  . Cancer Mother   . Heart disease Father   . Early death Father   . Sudden Cardiac Death Neg Hx     Social History   Tobacco Use  . Smoking status: Former Smoker    Years: 22.00    Quit date: 03/17/1972    Years since quitting: 47.5  . Smokeless tobacco: Never Used  Substance Use Topics  . Alcohol  use: Yes    Alcohol/week: 1.0 standard drinks    Types: 1 Glasses of wine per week    Comment: wine  . Drug use: No    Home Medications Prior to Admission medications   Medication Sig Start Date End Date Taking? Authorizing Provider  acetaminophen (TYLENOL) 500 MG tablet Take 500-1,000 mg 3 (three) times daily as needed by mouth for moderate pain.     [provider]  Amino Acids-Protein Hydrolys (FEEDING SUPPLEMENT, PRO-STAT SUGAR FREE 64,) LIQD Take 30 mLs by mouth 2 (two) times daily. 09/08/19   Pokhrel, Corrie Mckusick, MD  amiodarone (PACERONE) 200 MG tablet TAKE 1 TABLET BY MOUTH  DAILY Patient taking differently: Take 200 mg by mouth daily.  03/27/19   Lorretta Harp, MD  Artificial Saliva (ACT DRY MOUTH) LOZG Use as directed 1 lozenge in the mouth or throat daily as needed (for dry mouth).    [provider]  aspirin EC 325 MG tablet Take 325 mg by mouth every evening.    [provider]  b complex-vitamin c-folic acid (NEPHRO-VITE) 0.8 MG TABS tablet Take 1 tablet by mouth See admin instructions. Take one tablet by mouth on Tuesday, Thursday, Saturday - after dialysis 09/07/18   [provider]  calcitonin, salmon, (MIACALCIN/FORTICAL) 200 UNIT/ACT nasal spray Place 1 spray into alternate nostrils daily.  07/06/19   [provider]  D-5000 125 MCG (5000 UT) TABS Take 1 tablet by mouth daily. 08/18/19   [provider]  DENTA 5000 PLUS 1.1 % CREA dental cream Place 1 application onto teeth at bedtime.  04/06/19   [provider]  docusate sodium (COLACE) 100 MG capsule Take 100 mg by mouth daily as needed for mild constipation.     [provider]  doxercalciferol (HECTOROL) 2.5 MCG capsule Take 2.5 mcg by mouth 3 (three) times a week.  02/28/19 02/27/20  [provider]  heparin 1000 unit/mL SOLN injection 1,000 Units by Dialysis route one time in dialysis.  02/07/19 02/06/20  [provider]  hydrocortisone cream 1 % Apply topically 4 (four) times daily. 09/08/19   Pokhrel, Corrie Mckusick, MD  isosorbide mononitrate (IMDUR) 60 MG 24 hr tablet TAKE 1 TABLET BY MOUTH  DAILY ; TAKE 1 TABLET ON  SUN, MON, WED, FRI MORNINGS AND 1 TABLET ON TUES, THU,  SAT AFTER DIALYSIS Patient taking differently: Take 60 mg by mouth daily.  07/21/19   McGowen, Adrian Blackwater, MD  Lidocaine 4 % PTCH Apply 1 patch topically daily as needed (Mild pain).    [provider]  lidocaine-prilocaine (EMLA) cream Apply 1 application topically See admin instructions. Apply small amount to access site (AVF) one hour before dialysis, cover with occlusive dressing (saran wrap) 11/11/18   [provider]  Multiple Vitamins-Minerals (MULTIVITAMIN WITH MINERALS) tablet Take 1 tablet by mouth at bedtime.     [provider]  ondansetron (ZOFRAN) 4 MG tablet Take 1 tablet (4 mg total) by mouth every 8 (eight) hours as needed for nausea or vomiting. 07/12/19   McGowen, Adrian Blackwater, MD  polyethylene glycol (MIRALAX / GLYCOLAX) 17 g packet Take 17 g by mouth daily as needed for mild constipation. 09/08/19   Pokhrel, Corrie Mckusick, MD  polyvinyl alcohol (LIQUIFILM TEARS) 1.4 % ophthalmic solution Place 1-2 drops into both eyes daily as needed for dry eyes.     [provider]  pravastatin (PRAVACHOL) 40 MG tablet TAKE 1 TABLET BY MOUTH  EVERY EVENING Patient taking differently: Take  40 mg by mouth at bedtime.  03/09/18   McGowen, Adrian Blackwater, MD  sevelamer carbonate (RENVELA) 800 MG tablet Take 1,600 mg by mouth 3 (three)  times daily with meals.     [provider]  vitamin C (ASCORBIC ACID) 500 MG tablet Take 500 mg by mouth daily.    [provider]  Vitamin D, Ergocalciferol, (DRISDOL) 1.25 MG (50000 UT) CAPS capsule Take 1 capsule (50,000 Units total) by mouth every Thursday. 04/28/18   McGowen, Adrian Blackwater, MD    Allergies    Clonidine derivatives, Adhesive [tape], Codeine, Nickel, and Sulfa antibiotics  Review of Systems   Review of Systems  Unable to perform ROS: Other    Physical Exam Updated Vital Signs BP (!) 146/71 (BP Location: Right Arm)   Pulse 87   Temp 98.1 F (36.7 C) (Oral)   Resp (!) 24   Ht 5\' 2"  (1.575 m)   Wt 51 kg   LMP  (LMP Unknown)   SpO2 100%   BMI 20.56 kg/m   Physical Exam Constitutional:      Appearance: She is well-developed.  HENT:     Head: Normocephalic and atraumatic.  Eyes:     Pupils: Pupils are equal, round, and reactive to light.  Cardiovascular:     Rate and Rhythm: Normal rate and regular rhythm.     Heart sounds: Murmur present.  Pulmonary:     Effort: Pulmonary effort is normal. No respiratory distress.     Breath sounds: Normal breath sounds. No wheezing or rales.  Chest:     Chest wall: Tenderness present.  Abdominal:     General: Bowel sounds are normal.     Palpations: Abdomen is soft.     Tenderness: There is abdominal tenderness (Generalized). There is no guarding or rebound.  Musculoskeletal:        General: Normal range of motion.     Cervical back: Normal range of motion and neck supple.     Comments: She hurts anywhere I touch her.  I do not see any obvious areas of swelling or deformity to her extremities.  Peripheral pulses are intact.  Lymphadenopathy:     Cervical: No cervical adenopathy.  Skin:    General: Skin is warm and dry.     Findings: No rash.  Neurological:     Mental Status: She is alert and oriented to person, place, and time.     ED Results / Procedures / Treatments   Labs (all labs ordered  are listed, but only abnormal results are displayed) Labs Reviewed  COMPREHENSIVE METABOLIC PANEL - Abnormal; Notable for the following components:      Result Value   Chloride 93 (*)    Creatinine, Ser 4.41 (*)    Calcium 8.8 (*)    Total Protein 5.9 (*)    Albumin 2.0 (*)    GFR calc non Af Amer 9 (*)    GFR calc Af Amer 10 (*)    All other components within normal limits  CBC WITH DIFFERENTIAL/PLATELET - Abnormal; Notable for the following components:   WBC 14.1 (*)    RBC 3.51 (*)    Hemoglobin 10.9 (*)    HCT 35.3 (*)    MCV 100.6 (*)    RDW 17.5 (*)    Neutro Abs 12.3 (*)    Lymphs Abs 0.5 (*)    Monocytes Absolute 1.1 (*)    Abs Immature Granulocytes 0.15 (*)    All other components  within normal limits  LIPASE, BLOOD - Abnormal; Notable for the following components:   Lipase 104 (*)    All other components within normal limits  TROPONIN I (HIGH SENSITIVITY) - Abnormal; Notable for the following components:   Troponin I (High Sensitivity) 49 (*)    All other components within normal limits  RESPIRATORY PANEL BY RT PCR (FLU A&B, COVID)  TROPONIN I (HIGH SENSITIVITY)    EKG None ED ECG REPORT   Date: 09/23/2019  Rate: 84  Rhythm: normal sinus rhythm  QRS Axis: normal  Intervals: normal  ST/T Wave abnormalities: nonspecific ST/T changes  Conduction Disutrbances:none  Narrative Interpretation:   Old EKG Reviewed: unchanged  I have personally reviewed the EKG tracing and agree with the computerized printout as noted.   Radiology CT Abdomen Pelvis Wo Contrast  Result Date: 09/23/2019 CLINICAL DATA:  Left chest pain and left shoulder pain. History of recently treated pneumonia. Patient on dialysis. EXAM: CT ABDOMEN AND PELVIS WITHOUT CONTRAST TECHNIQUE: Multidetector CT imaging of the abdomen and pelvis was performed following the standard protocol without IV contrast. COMPARISON:  August 31, 2019 FINDINGS: Lower chest: Enlarged heart. Calcific atherosclerotic  disease of the coronary arteries and aorta. Bilateral small pleural effusions. Left greater than right lung base airspace consolidation versus atelectasis. Hepatobiliary: No focal liver abnormality is seen. Layering gallbladder sludge/stones without secondary signs of acute cholecystitis. Pancreas: Unremarkable. No pancreatic ductal dilatation or surrounding inflammatory changes. Spleen: Normal in size without focal abnormality. Adrenals/Urinary Tract: Adrenal glands are unremarkable. Atrophic kidneys. Bilateral renal cysts. Stomach/Bowel: Stomach is within normal limits. No evidence of bowel wall thickening, distention, or inflammatory changes. Diffuse colonic diverticulosis without evidence of acute diverticulitis. Vascular/Lymphatic: Severe atherosclerosis and ectasia of the abdominal aorta. No enlarged abdominal or pelvic lymph nodes. Reproductive: Status post hysterectomy. No adnexal masses. Other: No abdominal wall hernia or abnormality. No abdominopelvic ascites. Musculoskeletal: Chronic T12 compression fracture post interval kyphoplasty. Chronic T9 compression fracture post kyphoplasty without significant alignment changes. IMPRESSION: 1. Bilateral small pleural effusions. Left greater than right lung base infectious consolidation versus atelectasis. 2. Cholelithiasis without secondary signs of acute cholecystitis. 3. Atrophic kidneys consistent with chronic renal insufficiency. 4. Diffuse colonic diverticulosis without evidence of acute diverticulitis. 5. Severe atherosclerosis and ectasia of the abdominal aorta. 6. Post interval kyphoplasty of T12 compression fracture without significant alignment changes. Aortic Atherosclerosis (ICD10-I70.0). Electronically Signed   By: Fidela Salisbury M.D.   On: 09/23/2019 11:47   DG Chest Port 1 View  Result Date: 09/23/2019 CLINICAL DATA:  Chest pain, recent pneumonia. EXAM: PORTABLE CHEST 1 VIEW COMPARISON:  Prior chest radiographs 09/14/2019 and earlier  FINDINGS: The cardiac silhouette is partially obscured, limiting evaluation of heart size. Aortic atherosclerosis. Redemonstrated opacity within the left mid to lower lung field consistent with pleural effusion with atelectasis and/or consolidation. Background interstitial prominence may reflect interstitial edema. No right pleural effusion or evidence of pneumothorax. Redemonstrated left chest pacer device. No acute bony abnormality identified. Sequela of prior vertebral augmentation at multiple levels. IMPRESSION: Increased opacity within the left mid to lower lung field consistent with pleural effusion with underlying atelectasis and/or consolidation. Radiographic follow-up to resolution is recommended. Unchanged cardiomegaly with probable mild background interstitial edema. Electronically Signed   By: Kellie Simmering DO   On: 09/23/2019 11:13    Procedures Procedures (including critical care time)  Medications Ordered in ED Medications  fentaNYL (SUBLIMAZE) injection 50 mcg (50 mcg Intravenous Given 09/23/19 1041)    ED Course  I have reviewed  the triage vital signs and the nursing notes.  Pertinent labs & imaging results that were available during my care of the patient were reviewed by me and considered in my medical decision making (see chart for details).    MDM Rules/Calculators/A&P                      Patient is a 86 year old female who presents with pain, primarily in her chest but she is really hurting all over.  She hurts anywhere I palpate her chest and abdomen.  She had labs done which shows an elevation in her white count.  Her creatinine is elevated which is consistent with her end-stage renal disease.  Her potassium is normal.  Her troponin is mildly elevated but similar to prior values, likely related to her kidney disease.  Her EKG does not show any ischemic changes.  She had a chest x-ray which shows an enlarging left pleural effusion with underlying consolidation versus  atelectasis.  She had a CT scan which confirmed these findings.  She has gallstones but no evidence of cholecystitis.  No other acute abnormality is noted.  She is feeling slightly better after treatment with pain medication in the ED.  Given her elevation in her white count and her presumed worsening pneumonia, I spoke with Dr. Lorin Mercy with the hospitalist service to admit the patient for further treatment. Final Clinical Impression(s) / ED Diagnoses Final diagnoses:  Chest pain, unspecified type  Pleural effusion    Rx / DC Orders ED Discharge Orders    None       Malvin Johns, MD 09/23/19 1442

## 2019-09-23 NOTE — Consult Note (Signed)
Palliative Medicine Inpatient Consult Note  Reason for consult:  Discuss goals of care in the setting of failure to thrive  HPI:  Per intake H&P --> Valerie Terrell is a 86 y.o. female with medical history significant of chronic back pain; CVA; pacemaker placement; afib, not on AC due to falls; HTN; HLD; ESRD on TTS HD; and chronic combined CHF presenting with L-sided CP.  She has had 4 ER visits and 4 admissions in the last 6 months.  Her most recent admission was 4/29-5/5 for LLL CAP, treated with Rocephin.  She was discharged late Wednesday and went to [place] for short-term rehab.  Her daughter went to check on her this morning - sleeping and woke up with terrible chest pain down her left arm, not sleeping well, "everything hurt."  No cough or SOB.  She has been on O2 since the last hospitalization, still 2.5 L since last hospitalization.     Palliative care was involved upon Valerie Terrell last two admissions to aid in symptom management and ongoing Valerie Terrell conversations. Valerie Terrell presents today from [facility] for left sided chest pain. Per primary team she has had more pronounced failure to thrive.   Clinical Assessment/Goals of Care: I have reviewed medical records including EPIC notes, labs and imaging, received report from bedside RN, assessed the patient.   I met with Valerie Terrell at bedside in the emergency room to furtherdiscuss diagnosis prognosis, GOC, EOL wishes, disposition and options.  I introduced Palliative Medicine as specialized medical care for people living with serious illness. It focuses on providing relief from the symptoms and stress of a serious illnes's. The goal is to improve quality of life for both the patient and the family. They are both quite familiar with Palliative care from our prior encounters.   I asked Valerie Terrell to tell me about herself. 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 She said that  she was born in [place] though she has lived in [place] for quite some time. She is divorced and has one daughter, Valerie Terrell who lives with her in [place]. She said that she use to enjoy gardening quite a bit. She is a faithful women and practices as a [employer].   I asked Valerie Terrell how things had been going since she went to [facility] this last Wednesday. She states that things have not been going very well. She shares that she saw something black run across the floor in her room. She states that the staff wait for prolonged periods of time before addressing her need to use the restroom or drink some water. She also feels that the food is unappetizing. She says that she does ensure the supplemental nutrition that she was on during her prior admission. She received nepro and ensure. Valerie Terrell states that she wants to eat but no one is present to offer her anything, she does attest to overtime have a decrease in her overall appetite and experiencing weight loss. I told her that we would ask dietary to evaluate her nutritional needs given her hypoalbuminemia.   Concepts specific to code status, artifical feeding and hydration, continued IV antibiotics and rehospitalization was had.At this point in time [place] and I discussed continuation of DNAR/DNI code status. We discussed her HD treatments which are prolonging her life. She states that she would presently like to continue dialysis and she wants to live.   The difference between a aggressive  medical intervention path and a palliative comfort care path for this patient at this time was had.Discussed the topic of hospice and their values which is preserving dignity and quality at the end of life.Valerie Terrell states that would still like to continue with the goal of getting stronger at a skilled facility and ideally going back home to live with her daughter thereafter. She expresses her frustrations with our medical system and the inability to perform her  daughter additional support in the home to provide care for her. Utilized therapeutic listening as a form of emotional support.   Discussed the importance of continued conversation with family and their medical providers regarding overall plan of care and treatment options, ensuring decisions are within the context of the patients values and GOCs.  Decision Maker: Valerie Terrell (Daughter) [phone]  SUMMARY OF RECOMMENDATIONS DNAR/DNI  Treat what is treatable for the time being   TOC --> Placement patient does not want to go back to [facility], interested to see if she would be able to go to [facility] through [facility] once discharged  [place]: DNAR/DNI  Symptom Management: Failure to Thrive:  - Dietician consulted for calorie count  - Encourage 1:1 feeding  - Supplemental nutrients w/ Nepro Carb Steady  Lower Back pain:  - Continue Oxycodone 43m PO Q6H PRN - Fentanyl 265m Q2H PRN if orals are not effective  Muscular Weakness: - PT[facility]OT consults[facility]  Delirium: - Delirium precautions - Get up during the day - Encourage a familiar face to remain present throughout the day - Keep blinds open and lights on during daylight hours - Minimize the use of opioids/benzodiazepines  Spiritual: - Chaplain consult  Need for emotional support: - Utilize therapeutic listening - Ensure safe space to express concerns  Palliative Prophylaxis:  Constipation, oral care, repositioning Q2H, delirium   Additional Recommendations (Limitations, Scope, Preferences):  Treat what is treatable  Psycho-social/Spiritual:  Desire for further Chaplaincy support:Yes  Additional  Recommendations:Education hospice care  Prognosis: Poor in the setting of worsening debility and now failure to thrive  Discharge Planning:Unclear, patient interested in being evaluated for CIR  PPS: 30%   This conversation/these recommendations were discussed with patient primary care team, Dr. YaLorin MercyTime In: 1530 Time Out: 1650 Total Time: 80 Greater than 50%  of this time was spent counseling and coordinating care related to the above assessment and plan.  [facility] Team Cell Phone: [phone] utilize secure chat with additional questions, if there is no response within 30 minutes please call the above phone number  Palliative Medicine Team providers are available by phone from 7am to 7pm daily and can be reached through the team cell phone.  Should this patient require assistance outside of these hours, please call the patient's attending physician.

## 2019-09-23 NOTE — H&P (Signed)
History and Physical    Valerie Terrell HGD:[id number] DOB: [birth year] DOA: 09/23/2019  PCP: Tammi Sou, MD Consultants:  Berry/Taylor - cardiology; Lorrene Reid - nephrology; Nelva Bush - orthopedics Patient coming from: [facility]; NOK: Daughter, [phone]  Chief Complaint:  L-sided CP  HPI: Valerie Terrell is a 86 y.o. female with medical history significant of chronic back pain; CVA; pacemaker placement; afib, not on AC due to falls; HTN; HLD; ESRD on TTS HD; and chronic combined CHF presenting with L-sided CP.  She has had 4 ER visits and 4 admissions in the last 6 months.  Her most recent admission was 4/29-5/5 for LLL CAP, treated with Rocephin.  She was discharged late Wednesday and went to [facility] for short-term rehab.  Her daughter went to check on her this morning - sleeping and woke up with terrible chest pain down her left arm, not sleeping well, "everything hurt."  No cough or SOB.  She has been on O2 since the last hospitalization, still 2.5 L since last hospitalization.    CP started in the left rib area and radiated down the left arm.  The pain has resolved after oxycodone, fentanyl.  Thursday, she had HD and she "was pitiful after dialysis" - weak and out of it more than normal.  She was talking with palliative care NP.  Yesterday was a bit better, able to do a little PT/OT.   She was able to get OOB to bathroom.     ED Course:  Discharged recently to [facility].  C/o L chest pain, moaning, diffuse discomfort.  Calmer now.  CXR with ?worsening effusion/consolidation with elevated WBC count.   Review of Systems: As per HPI; otherwise review of systems reviewed and negative.   Ambulatory Status: Non-ambulatory - barely able to stand now  COVID Vaccine Status:  Complete  Past Medical History:  Diagnosis Date  . Anemia of chronic disease 2018   + anemia of CRI?  [name] Atrophy of left kidney    with absent blood flow by renal artery dopplers (Dr. Gwenlyn Found)  . Branch  retinal artery occlusion of left eye 2017  . Chronic combined systolic and diastolic CHF (congestive heart failure) ([place])    a. 11/2016: echo showing EF of 35-40%, RV strain noted, mild MR and mild TR.   Echo showed much improved EF 12/2017 (55-60%)  . Chronic respiratory failure with hypoxia ([place]) 02/11/2017   Now on chronic O2 (intermittent as of summer 2019).    . Closed fracture of right superior pubic ramus ([place]) 07/18/2018   Non op mgmt (Dr. Victorino December was ortho who saw her in hosp but i don't think pt followed up with him).  . Closed right hip fracture ([place]) 10/04/2018   ORIF  . Debilitated patient    WC dependent as of 2018.  Hosp bed + full assistance with most ADLs s/p hip fx surg 09/2018.  [name] ESRD on hemodialysis ([place]) 01/2017   T/Th/Sat schedule- [place].  Right basilic AV fistula 21/1941.  Graft 2019.  [name] History of adenomatous polyp of colon   . History of kidney stones   . History of subarachnoid hemorrhage 10/2014   after syncope and while on xarelto  . Hyperlipidemia   . Hypertension    Difficult to control, in the setting of one functioning kidney: pt was referred to nephrology by Dr. Gwenlyn Found 06/2015.  . Lumbar radiculopathy 2012  . Lumbar spinal stenosis 2019   facet injections only very short term relief.  L1 and L2 selective nerve root blocks helpful 04/2018.  . Lumbar spondylosis    MR 07/2016---no sign of spinal nerve compression or cord compression.  Pt set up with outpt ortho while admitted to hosp 07/2016.  . Malnourished ([place])   . Metatarsal fracture 06/10/2016   Nondisplaced, left 5th metatarsal--pt was referred to ortho  . Osteopenia 2014   T-score -2.1  . PAF (paroxysmal atrial fibrillation) (HCC)    Eliquis started after BRAO and CVA.  She was changed to warfarin 2018. All anticoag stopped due to recurrent falls.  . Presence of permanent cardiac pacemaker   . Right rib fracture 12/2016   s/p fall  . Sick sinus syndrome ([place]) 11/2016   Dual chamber  pacer insertion 2018 (Dr. Lovena Le)  . Stroke ([place]) 78/2956   cardioembolic (had CVA while on no anticoag)--"scattered subacute punctate infarcts: 1 in R parietal lobe and 2 in occipital cortex" on MRI br.  CT angio head/neck: aortic arch athero.  R ICA 20% stenosis, L ICA w/out any stenosis.  . Thoracic back pain 01/2017; 01/2018   2018: Facet?  Dr. Ramos->steroid injection.  01/2018-->CT T spine to check for a new comp fx-->none found.  Selective nerve root inj in T spine helpful on R side.   . Thoracic compression fracture ([place]) 07/2016   T3.  T7 and T8-- T8 kyphoplasty during hosp admission 07/2016.  Neuro referred pt to pain mgmt for consideration of injection 09/2016.  I referred her to endo 08/2016 for consideration of calcitonin treatment.    Past Surgical History:  Procedure Laterality Date  . APPENDECTOMY  child  . AV FISTULA PLACEMENT Right 03/31/2017   Procedure: Right ARM Basilic vein transposition;  Surgeon: Conrad Fairchild, MD;  Location[facility] OR;  Service: Vascular;  Laterality: Right;  . AV FISTULA PLACEMENT Right 12/06/2017   Procedure: INSERTION OF ARTERIOVENOUS (AV) GORE-TEX GRAFT ARM RIGHT UPPER EXTREMITY;  Surgeon: [name], MD;  Location: [place];  Service: Vascular;  Laterality: Right;  . CARDIOVASCULAR STRESS TEST  01/31/2016   Stress myoview: NORMAL/Low risk.  EF 56%.  . [place]  . COLONOSCOPY  2015   + hx of adenomatous polyps.  Need digest health spec in [place] records to see when pt due for next colonoscopy  . DECLOT CKV Right 03/21/2019  . EYE SURGERY Bilateral    Catarct  . INSERTION OF DIALYSIS CATHETER Right 01/29/2017   Procedure: INSERTION OF DIALYSIS CATHETER- RIGHT INTERNAL JUGULAR;  Surgeon: [name], MD;  Location: [place];  Service: Vascular;  Laterality: Right;  . INTRAMEDULLARY (IM) NAIL INTERTROCHANTERIC Right 10/04/2018   Procedure: INTRAMEDULLARY (IM) NAIL INTERTROCHANTRIC;  Surgeon: Nicholes Stairs, MD;  Location: [place];  Service:  Orthopedics;  Laterality: Right;  . IR GENERIC HISTORICAL  07/24/2016   IR KYPHO THORACIC WITH BONE BIOPSY 07/24/2016 Luanne Bras, MD [facility]-INTERV RAD  . IR KYPHO THORACIC WITH BONE BIOPSY  09/06/2019  . KYPHOPLASTY  07/27/2016   T8  . PACEMAKER IMPLANT N/A 12/03/2016   Procedure: Pacemaker Implant;  Surgeon: Evans Lance, MD;  Location: [place] CV LAB;  Service: Cardiovascular;  Laterality: N/A;  . Renal artery dopplers  02/26/2016   Her right renal dimension was 11 cm pole to pole with mild to moderate right renal artery stenosis. A right renal aortic ratio was 3.22 suggesting less than a 50% stenosis.  . Right upper arm AV Gore-Tex graft  12/06/2017   Dr. Donnetta Hutching  . TONSILLECTOMY    .  TRANSTHORACIC ECHOCARDIOGRAM  01/29/2016; 11/2016; 12/2017   2017: EF 55-60%, normal LV wall motion, grade I DD.  No cardiac source of emboli was seen.  2018: EF 35-40%, could not assess DD due to a-fib, pulm HTN noted. 12/2017 EF 55-60%, nl wall motion, grd II DD, mod MR and pulm regurg, increased pulm pressure (peak 52).    Social History   Socioeconomic History  . Marital status: Widowed    Spouse name: Not on file  . Number of children: Not on file  . Years of education: Not on file  . Highest education level: Not on file  Occupational History  . Not on file  Tobacco Use  . Smoking status: Former Smoker    Years: 22.00    Quit date: 03/17/1972    Years since quitting: 47.5  . Smokeless tobacco: Never Used  Substance and Sexual Activity  . Alcohol use: Yes    Alcohol/week: 1.0 standard drinks    Types: 1 Glasses of wine per week    Comment: wine  . Drug use: No  . Sexual activity: Not Currently  Other Topics Concern  . Not on file  Social History Narrative   Widow.  One daughter, lives with her.   Educ: college   Occup: retired Marine scientist.   No T/A/Ds.   She is almost a vegetarian.   Social Determinants of Health   Financial Resource Strain:   . Difficulty of Paying Living Expenses:     Food Insecurity:   . Worried About Charity fundraiser in the Last Year:   . Arboriculturist in the Last Year:   Transportation Needs:   . Film/video editor (Medical):   [name] Lack of Transportation (Non-Medical):   Physical Activity:   . Days of Exercise per Week:   . Minutes of Exercise per Session:   Stress:   . Feeling of Stress :   Social Connections:   . Frequency of Communication with Friends and Family:   . Frequency of Social Gatherings with Friends and Family:   . Attends Religious Services:   . Active Member of Clubs or Organizations:   . Attends Archivist Meetings:   [name] Marital Status:   Intimate Partner Violence:   . Fear of Current or Ex-Partner:   . Emotionally Abused:   [name] Physically Abused:   . Sexually Abused:     Allergies  Allergen Reactions  . Clonidine Derivatives Palpitations and Other (See Comments)    VERY SEDATED  . Adhesive [Tape] Other (See Comments)    Tears skin up  . Codeine Nausea Only  . Nickel Rash  . Sulfa Antibiotics Other (See Comments)     Did not feel well when taken    Family History  Problem Relation Age of Onset  . Cancer Mother   . Heart disease Father   . Early death Father   . Sudden Cardiac Death Neg Hx     Prior to Admission medications   Medication Sig Start Date End Date Taking? Authorizing Provider  acetaminophen (TYLENOL) 500 MG tablet Take 500-1,000 mg 3 (three) times daily as needed by mouth for moderate pain.     [provider]  Amino Acids-Protein Hydrolys (FEEDING SUPPLEMENT, PRO-STAT SUGAR FREE 64,) LIQD Take 30 mLs by mouth 2 (two) times daily. 09/08/19   Pokhrel, Corrie Mckusick, MD  amiodarone (PACERONE) 200 MG tablet TAKE 1 TABLET BY MOUTH  DAILY Patient taking differently: Take 200 mg by mouth daily.  03/27/19   Lorretta Harp, MD  Artificial Saliva (ACT DRY MOUTH) LOZG Use as directed 1 lozenge in the mouth or throat daily as needed (for dry mouth).    [provider]  aspirin EC  325 MG tablet Take 325 mg by mouth every evening.    [provider]  b complex-vitamin c-folic acid (NEPHRO-VITE) 0.8 MG TABS tablet Take 1 tablet by mouth See admin instructions. Take one tablet by mouth on Tuesday, Thursday, Saturday - after dialysis 09/07/18   [provider]  calcitonin, salmon, (MIACALCIN/FORTICAL) 200 UNIT/ACT nasal spray Place 1 spray into alternate nostrils daily.  07/06/19   [provider]  D-5000 125 MCG (5000 UT) TABS Take 1 tablet by mouth daily. 08/18/19   [provider]  DENTA 5000 PLUS 1.1 % CREA dental cream Place 1 application onto teeth at bedtime.  04/06/19   [provider]  docusate sodium (COLACE) 100 MG capsule Take 100 mg by mouth daily as needed for mild constipation.     [provider]  doxercalciferol (HECTOROL) 2.5 MCG capsule Take 2.5 mcg by mouth 3 (three) times a week.  02/28/19 02/27/20  [provider]  heparin 1000 unit/mL SOLN injection 1,000 Units by Dialysis route one time in dialysis.  02/07/19 02/06/20  [provider]  hydrocortisone cream 1 % Apply topically 4 (four) times daily. 09/08/19   Pokhrel, Corrie Mckusick, MD  isosorbide mononitrate (IMDUR) 60 MG 24 hr tablet TAKE 1 TABLET BY MOUTH  DAILY ; TAKE 1 TABLET ON  SUN, MON, WED, FRI MORNINGS AND 1 TABLET ON TUES, THU,  SAT AFTER DIALYSIS Patient taking differently: Take 60 mg by mouth daily.  07/21/19   McGowen, Adrian Blackwater, MD  Lidocaine 4 % PTCH Apply 1 patch topically daily as needed (Mild pain).    [provider]  lidocaine-prilocaine (EMLA) cream Apply 1 application topically See admin instructions. Apply small amount to access site (AVF) one hour before dialysis, cover with occlusive dressing (saran wrap) 11/11/18   [provider]  Multiple Vitamins-Minerals (MULTIVITAMIN WITH MINERALS) tablet Take 1 tablet by mouth at bedtime.     [provider]  ondansetron (ZOFRAN) 4 MG tablet Take 1 tablet (4 mg  total) by mouth every 8 (eight) hours as needed for nausea or vomiting. 07/12/19   McGowen, Adrian Blackwater, MD  polyethylene glycol (MIRALAX / GLYCOLAX) 17 g packet Take 17 g by mouth daily as needed for mild constipation. 09/08/19   Pokhrel, Corrie Mckusick, MD  polyvinyl alcohol (LIQUIFILM TEARS) 1.4 % ophthalmic solution Place 1-2 drops into both eyes daily as needed for dry eyes.     [provider]  pravastatin (PRAVACHOL) 40 MG tablet TAKE 1 TABLET BY MOUTH  EVERY EVENING Patient taking differently: Take 40 mg by mouth at bedtime.  03/09/18   McGowen, Adrian Blackwater, MD  sevelamer carbonate (RENVELA) 800 MG tablet Take 1,600 mg by mouth 3 (three) times daily with meals.     [provider]  vitamin C (ASCORBIC ACID) 500 MG tablet Take 500 mg by mouth daily.    [provider]  Vitamin D, Ergocalciferol, (DRISDOL) 1.25 MG (50000 UT) CAPS capsule Take 1 capsule (50,000 Units total) by mouth every Thursday. 04/28/18   Tammi Sou, MD    Physical Exam: Vitals:   09/23/19 1002 09/23/19 1130 09/23/19 1200 09/23/19 1430  BP: (!) 152/96 (!) 120/55 (!) 112/53 (!) 146/71  Pulse: 84 78 73 87  Resp: 16 13 14  (!)  24  Temp: 98.1 F (36.7 C)     TempSrc: Oral     SpO2: 100% 95% 99% 100%  Weight:      Height:         . General:  Appears frail and chronically ill . Eyes:  PERRL, EOMI, normal lids, iris . ENT:   Hard of hearing, grossly normal lips & tongue, mmm; appropriate dentition . Neck:  no LAD, masses or thyromegaly . Cardiovascular:  RRR, no m/r/g. 1+ LE edema.  [name] Respiratory:   CTA bilaterally with no wheezes/rales/rhonchi.  Normal respiratory effort. . Abdomen:  soft, NT, ND, NABS . Skin:  no rash or induration seen on limited exam . Musculoskeletal:  Mildly atrophic tone BUE/BLE, good ROM, no bony abnormality . Psychiatric:  flat mood and affect, speech fluent and appropriate, AOx3 . Neurologic:  CN 2-12 grossly intact, moves all extremities in coordinated  fashion    Radiological Exams on Admission: CT Abdomen Pelvis Wo Contrast  Result Date: 09/23/2019 CLINICAL DATA:  Left chest pain and left shoulder pain. History of recently treated pneumonia. Patient on dialysis. EXAM: CT ABDOMEN AND PELVIS WITHOUT CONTRAST TECHNIQUE: Multidetector CT imaging of the abdomen and pelvis was performed following the standard protocol without IV contrast. COMPARISON:  August 31, 2019 FINDINGS: Lower chest: Enlarged heart. Calcific atherosclerotic disease of the coronary arteries and aorta. Bilateral small pleural effusions. Left greater than right lung base airspace consolidation versus atelectasis. Hepatobiliary: No focal liver abnormality is seen. Layering gallbladder sludge/stones without secondary signs of acute cholecystitis. Pancreas: Unremarkable. No pancreatic ductal dilatation or surrounding inflammatory changes. Spleen: Normal in size without focal abnormality. Adrenals/Urinary Tract: Adrenal glands are unremarkable. Atrophic kidneys. Bilateral renal cysts. Stomach/Bowel: Stomach is within normal limits. No evidence of bowel wall thickening, distention, or inflammatory changes. Diffuse colonic diverticulosis without evidence of acute diverticulitis. Vascular/Lymphatic: Severe atherosclerosis and ectasia of the abdominal aorta. No enlarged abdominal or pelvic lymph nodes. Reproductive: Status post hysterectomy. No adnexal masses. Other: No abdominal wall hernia or abnormality. No abdominopelvic ascites. Musculoskeletal: Chronic T12 compression fracture post interval kyphoplasty. Chronic T9 compression fracture post kyphoplasty without significant alignment changes. IMPRESSION: 1. Bilateral small pleural effusions. Left greater than right lung base infectious consolidation versus atelectasis. 2. Cholelithiasis without secondary signs of acute cholecystitis. 3. Atrophic kidneys consistent with chronic renal insufficiency. 4. Diffuse colonic diverticulosis without evidence  of acute diverticulitis. 5. Severe atherosclerosis and ectasia of the abdominal aorta. 6. Post interval kyphoplasty of T12 compression fracture without significant alignment changes. Aortic Atherosclerosis (ICD10-I70.0). Electronically Signed   By: Fidela Salisbury M.D.   On: 09/23/2019 11:47   DG Chest Port 1 View  Result Date: 09/23/2019 CLINICAL DATA:  Chest pain, recent pneumonia. EXAM: PORTABLE CHEST 1 VIEW COMPARISON:  Prior chest radiographs 09/14/2019 and earlier FINDINGS: The cardiac silhouette is partially obscured, limiting evaluation of heart size. Aortic atherosclerosis. Redemonstrated opacity within the left mid to lower lung field consistent with pleural effusion with atelectasis and/or consolidation. Background interstitial prominence may reflect interstitial edema. No right pleural effusion or evidence of pneumothorax. Redemonstrated left chest pacer device. No acute bony abnormality identified. Sequela of prior vertebral augmentation at multiple levels. IMPRESSION: Increased opacity within the left mid to lower lung field consistent with pleural effusion with underlying atelectasis and/or consolidation. Radiographic follow-up to resolution is recommended. Unchanged cardiomegaly with probable mild background interstitial edema. Electronically Signed   By: Kellie Simmering DO   On: 09/23/2019 11:13    EKG: Independently reviewed.  NSR with  rate 84; no evidence of acute ischemia   Labs on Admission: I have personally reviewed the available labs and imaging studies at the time of the admission.  Pertinent labs:   BUN 15/Creatinine 4.41/GFR 9 Albumin 2.0 HS troponin 49, 50 WBC 14.1 Hgb 10.9 Respiratory panel PCR negative   Assessment/Plan Principal Problem:   Failure to thrive in adult Active Problems:   PAF (paroxysmal atrial fibrillation) (HCC)   Left-sided chest pain   Dyslipidemia   Chronic combined systolic and diastolic heart failure (HCC)   ESRD on dialysis (HCC)    Chronic back pain    Failure to thrive -Patient with multiple recent hospitalizations and ER visits -She is enrolled with outpatient palliative care as well as having palliative care consultations while here in the hospital -She was previously living at home with her daughter as her caregiver but was discharged to SNF rehab on 5/5 -This has failed and her daughter and the patient prefer that she return home -Will place palliative care consult -Would strongly consider transition to comfort measures since she is no longer ambulatory and essentially unable to care for herself and requiring HD 4 days a week -As noted below, this does not appear to be a new diagnosis at this time but rather a progressive condition   Left-sided CP -Patient complained this AM of left-sided CP with radiation along the left arm -Improved with pain medication -Low suspicion for ACS -HS troponin with negative delta -Pleural effusion on the left is slightly larger, possibly the source of discomfort -CXR and CT equivocal for PNA, no obvious infectious symptoms (cough after eating, more suggestive of dysphagia so aspiration would be a consideration) -D/w pulmonology to see if thoracentesis is reasonable - unlikely large enough for drainage per PCCM, looks like atelectasis  -Will check procalcitonin but if negative will not treat with antibiotics -Continue [place] O2  ESRD on MTTS HD patient -Patient has chronic pain and so difficulty sitting in the chair and also recurrent hypotension -As such, it is difficult for them to draw enough fluid off her -She is now doing HD 4 days a week -Nephrology prn order set utilized -Nephrology is aware that patient will need HD -Continue Hectorol  Chronic back pain -I have reviewed this patient in the [place] Controlled Substances Reporting System.  she is receiving medications from only one provider and appears to be taking them as prescribed. -She is not at particularly high risk of  opioid misuse, diversion, or overdose. -Continue prn oxycodone without escalation of pain control -She was previously on Fentanyl patch but this appears to have been discontinued at some point since I last admitted her on 3/31 -Continue Miacalcin, Renvela  Afib, not on [facility] -Has pacemaker -Rate control with amiodarone -not on AC due to falls  H/o CVA -Continue ASA, Imdur  HTN -No longer taking medications for this issue  HLD -Continue Pravachol  Chronic combined CHF -11/2017 echo with preserved EF and grade 2 diastolic dysfunction -Volume control with HD     Note: This patient has been tested and is negative for the novel coronavirus COVID-19.  DVT prophylaxis: Heparin Code Status:  DNR - confirmed with patient/family/bedside MOST form Family Communication: Daughter was present throughout evaluation. Disposition Plan:  The patient is from: SNF (briefly) but will d/c to home  Anticipated d/c is to: home with [facility] and will need home O2  Anticipated d/c date will depend on clinical response to treatment, but possibly as early as tomorrow depending on ongoing  need for treatment  Patient is currently: chronically ill Consults called:  Nephrology; palliative care; PCCM by telephone only; PT/OT/ST Admission status:  It is my clinical opinion that referral for OBSERVATION is reasonable and necessary in this patient based on the above information provided. The aforementioned taken together are felt to place the patient at high risk for further clinical deterioration. However it is anticipated that the patient may be medically stable for discharge from the hospital within 24 to 48 hours.    Karmen Bongo MD [facility]   How to contact the [facility] Attending or Consulting provider [place] or covering provider during after hours [place], for this patient?  1. Check the care team in [facility] and look for a) attending/consulting TRH provider listed and b) the [facility] team listed 2. Log  into [email or web address] and use [place]'s universal password to access. If you do not have the password, please contact the hospital operator. 3. Locate the [facility] provider you are looking for under [facility] and page to a number that you can be directly reached. 4. If you still have difficulty reaching the provider, please page the [facility] (Director on Call) for the Hospitalists listed on amion for assistance.   09/23/2019, 3:23 PM

## 2019-09-23 NOTE — ED Triage Notes (Signed)
PT from [facility] with multiple pain sites. Lt CP,Lt shoulder pain and Lt neck pain. Pt recent treatment for PNA. Pt moaning on arrival to ED room . Pt received Oxycodone ,tylenol and zofran at approx. 0900 at [facility] before EMS arrived. Pt also has a Hx of dialysis and goes on T,T,SAt. Pt is due dialysis today. BP 149/62, P 96, Pt PCO2 96 % on 2 lit nasal O2. Pt is O2 dependent at home.

## 2019-09-24 DIAGNOSIS — N186 End stage renal disease: Secondary | ICD-10-CM

## 2019-09-24 DIAGNOSIS — R079 Chest pain, unspecified: Secondary | ICD-10-CM

## 2019-09-24 DIAGNOSIS — Z992 Dependence on renal dialysis: Secondary | ICD-10-CM

## 2019-09-24 DIAGNOSIS — I132 Hypertensive heart and chronic kidney disease with heart failure and with stage 5 chronic kidney disease, or end stage renal disease: Secondary | ICD-10-CM | POA: Diagnosis not present

## 2019-09-24 DIAGNOSIS — Z515 Encounter for palliative care: Secondary | ICD-10-CM | POA: Diagnosis not present

## 2019-09-24 DIAGNOSIS — I5042 Chronic combined systolic (congestive) and diastolic (congestive) heart failure: Secondary | ICD-10-CM | POA: Diagnosis not present

## 2019-09-24 DIAGNOSIS — R627 Adult failure to thrive: Secondary | ICD-10-CM | POA: Diagnosis not present

## 2019-09-24 DIAGNOSIS — Z7189 Other specified counseling: Secondary | ICD-10-CM | POA: Diagnosis not present

## 2019-09-24 DIAGNOSIS — I480 Paroxysmal atrial fibrillation: Secondary | ICD-10-CM | POA: Diagnosis not present

## 2019-09-24 LAB — BASIC METABOLIC PANEL
Anion gap: 12 (ref 5–15)
BUN: <5 mg/dL — ABNORMAL LOW (ref 8–23)
CO2: 29 mmol/L (ref 22–32)
Calcium: 8 mg/dL — ABNORMAL LOW (ref 8.9–10.3)
Chloride: 96 mmol/L — ABNORMAL LOW (ref 98–111)
Creatinine, Ser: 2.06 mg/dL — ABNORMAL HIGH (ref 0.44–1.00)
GFR calc Af Amer: 25 mL/min — ABNORMAL LOW (ref >60)
GFR calc non Af Amer: 22 mL/min — ABNORMAL LOW (ref >60)
Glucose, Bld: 85 mg/dL (ref 70–99)
Potassium: 3.7 mmol/L (ref 3.5–5.1)
Sodium: 137 mmol/L (ref 135–145)

## 2019-09-24 LAB — CBC
HCT: 33.5 % — ABNORMAL LOW (ref 36.0–46.0)
Hemoglobin: 10.1 g/dL — ABNORMAL LOW (ref 12.0–15.0)
MCH: 30.1 pg (ref 26.0–34.0)
MCHC: 30.1 g/dL (ref 30.0–36.0)
MCV: 100 fL (ref 80.0–100.0)
Platelets: 241 10*3/uL (ref 150–400)
RBC: 3.35 MIL/uL — ABNORMAL LOW (ref 3.87–5.11)
RDW: 17.4 % — ABNORMAL HIGH (ref 11.5–15.5)
WBC: 10.7 10*3/uL — ABNORMAL HIGH (ref 4.0–10.5)
nRBC: 0 % (ref 0.0–0.2)

## 2019-09-24 LAB — MRSA PCR SCREENING: MRSA by PCR: NEGATIVE

## 2019-09-24 LAB — PHOSPHORUS: Phosphorus: 2.8 mg/dL (ref 2.5–4.6)

## 2019-09-24 MED ORDER — TRAZODONE HCL 50 MG PO TABS
50.0000 mg | ORAL_TABLET | Freq: Every day | ORAL | Status: DC
  Administered 2019-09-24: 50 mg via ORAL
  Filled 2019-09-24: qty 1

## 2019-09-24 MED ORDER — CHLORHEXIDINE GLUCONATE CLOTH 2 % EX PADS
6.0000 | MEDICATED_PAD | Freq: Every day | CUTANEOUS | Status: DC
  Administered 2019-09-25: 6 via TOPICAL

## 2019-09-24 MED ORDER — OXYCODONE HCL 5 MG PO TABS
10.0000 mg | ORAL_TABLET | Freq: Four times a day (QID) | ORAL | Status: DC | PRN
  Administered 2019-09-24: 10 mg via ORAL
  Filled 2019-09-24 (×2): qty 2

## 2019-09-24 NOTE — Progress Notes (Addendum)
Occupational Therapy Evaluation Patient Details Name: Valerie Terrell MRN: [id number] DOB: 10-21-1932 Today's Date: 09/24/2019    History of Present Illness Valerie Terrell is a 86 y.o. female with medical history significant of chronic back pain; CVA; pacemaker placement; afib, not on AC due to falls; HTN; HLD; ESRD on TTS HD; and chronic combined CHF presenting with L-sided CP.  She has had 4 ER visits and 4 admissions in the last 6 months.  Her most recent admission was 4/29-5/5 for LLL CAP, treated with Rocephin.  She was discharged late Wednesday and went to [facility] for short-term rehab.  Her daughter went to check on her this morning - sleeping and woke up with terrible chest pain down her left arm, not sleeping well, "everything hurt." Principle problem includes: Failure to thrive.    Clinical Impression   Pt has had multiple readmissions and recently discharged to [facility] 4 days ago. Per chart, pt has been requiring assist for ADLs and has not ambulated since April. Pt currently requires setup to max assist for self-care and functional transfer tasks. Pt tolerated sitting EOB ~8 min with variable supervision to min assist. Several instances of left lateral LOB with pt requiring assist to self-correct. Pt noted to be incontinent of bowel requiring max assist for peri care in standing. Pt transferred to bedside chair with RW and min assist x 2 to ensure balance and safety. Pt on 3L [place] upon arrival with SpO2 98%. SpO2 dropped to 81% on room air with pt requiring ~1 min seated recovery for O2 to increase back into 90s on 3L [place]. Pt reported min SOB throughout. Pt demonstrates decreased strength, endurance, balance, standing tolerance, and activity tolerance impacting ability to complete self-care and functional transfer tasks. Recommend skilled OT services to address above deficits in order to promote function and prevent further decline. Recommend [place] OT and 24/7 assist for additional  rehab as pt is refusing SNF.     Follow Up Recommendations  Home health OT;Supervision/Assistance - 24 hour(Pt is refusing SNF)    Equipment Recommendations  Other (comment)(TBD based on pt's progress in therapy and discharge location)    Recommendations for Other Services       Precautions / Restrictions Precautions Precautions: Fall;Other (comment) Precaution Comments: Monitor SpO2. T12 compression fx last admission. Restrictions Weight Bearing Restrictions: No      Mobility Bed Mobility Overal bed mobility: Needs Assistance Bed Mobility: Supine to Sit   Sidelying to sit: Min assist;HOB elevated       General bed mobility comments: Multimodal cues to initiate task. Able to bring BLEs off bed, requiring assist to power trunk up into sitting.   Transfers Overall transfer level: Needs assistance Equipment used: Rolling walker (2 wheeled) Transfers: Sit to/from [facility] Sit to Stand: Min assist;+2 physical assistance Stand pivot transfers: Min assist;+2 physical assistance       General transfer comment: Assist to power up into standing. Extremely kyphotic. Min assist x 2 for balance and safety while pivoting to bedside chair.     Balance Overall balance assessment: Needs assistance Sitting-balance support: Feet supported Sitting balance-Leahy Scale: (poor to fair) Sitting balance - Comments: Pt tolerated sitting EOB ~8 min with variable supervision to min assist. Occasional left lateral LOB with pt requiring assist to self-correct.  Postural control: Left lateral lean Standing balance support: Bilateral upper extremity supported;During functional activity Standing balance-Leahy Scale: Poor  ADL either performed or assessed with clinical judgement   ADL Overall ADL's : Needs assistance/impaired Eating/Feeding: Set up;Supervision/ safety;Bed level   Grooming: Wash/dry hands;Wash/dry face;Brushing hair;Set  up;Supervision/safety;Sitting Grooming Details (indicate cue type and reason): While seated in bedside chair Upper Body Bathing: Minimal assistance;Sitting   Lower Body Bathing: Sitting/lateral leans;Sit to/from stand;Maximal assistance   Upper Body Dressing : Sitting;Moderate assistance   Lower Body Dressing: Moderate assistance;Sit to/from stand;Sitting/lateral leans Lower Body Dressing Details (indicate cue type and reason): While seated EOB. Able to don socks once started over feet, utilizing figure four pattern.  Toilet Transfer: Minimal assistance;+2 for physical assistance;BSC   Toileting- Clothing Manipulation and Hygiene: Maximal assistance;Sit to/from stand Toileting - Clothing Manipulation Details (indicate cue type and reason): Assist for peri care following BM     Functional mobility during ADLs: Minimal assistance;+2 for physical assistance;+2 for safety/equipment;Rolling walker General ADL Comments: Pt tolerated sitting EOB ~8 min with variable supervision to min assist. Pt able to transfer to bedside chair with RW and min assist x 2. 0 instances of LOB, however pt unsteady on feet.      Vision Baseline Vision/History: Wears glasses Wears Glasses: At all times       Perception     Praxis      Pertinent Vitals/Pain Pain Assessment: No/denies pain     Hand Dominance Right   Extremity/Trunk Assessment Upper Extremity Assessment Upper Extremity Assessment: Generalized weakness   Lower Extremity Assessment Lower Extremity Assessment: Defer to PT evaluation   Cervical / Trunk Assessment Cervical / Trunk Assessment: Kyphotic   Communication Communication Communication: HOH   Cognition Arousal/Alertness: Awake/alert Behavior During Therapy: WFL for tasks assessed/performed Overall Cognitive Status: Impaired/Different from baseline Area of Impairment: Orientation;Memory;Following commands                 Orientation Level: Disoriented  to;Place;Time;Situation   Memory: Decreased short-term memory Following Commands: Follows one step commands consistently;Follows one step commands with increased time     Problem Solving: Slow processing;Decreased initiation;Requires verbal cues;Requires tactile cues General Comments: Pt pleasant and willing to participate in therapy. Requires increased time to complete tasks. Pt appears confused stating that she's at [facility]. Multimodal cues to initiate bed mobility tasks.    General Comments  Pt on 3L [place] with SpO2 98%. SpO2 dropped to 81% on room air with pt requiring ~1 min seated recovery for O2 to increase back into 90s on 3L [place]. Pt reported min SOB throughout.     Exercises Exercises: Other exercises Other Exercises Other Exercises: Educated pt on pursed lip breathing exercises.    Shoulder Instructions      Home Living Family/patient expects to be discharged to:: Skilled nursing facility                                 Additional Comments: Unsure of accuracy of PLOF as pt is confused, A&O x 1. PLOF obtained from previous admission.       Prior Functioning/Environment Level of Independence: Needs assistance  Gait / Transfers Assistance Needed: PLOF obtained from chart: Pt has been unable to ambulate since discharge from hospital at end of April.  Daughter has been providing max A for transfers  ADL's / Homemaking Assistance Needed: Pt has been requiring assist with all ADLs since last admission in April             OT Problem List:  OT Treatment/Interventions: Self-care/ADL training;Therapeutic exercise;Neuromuscular education;Energy conservation;DME and/or AE instruction;Therapeutic activities;Patient/family education;Balance training    OT Goals(Current goals can be found in the care plan section) Acute Rehab OT Goals Patient Stated Goal: to get a stronger trunk Time For Goal Achievement: 10/08/19 Potential to Achieve Goals: Fair ADL Goals Pt  Will Perform Grooming: with set-up;sitting Pt Will Perform Upper Body Bathing: with set-up;sitting Pt Will Transfer to Toilet: with min assist;bedside commode Pt Will Perform Toileting - Clothing Manipulation and hygiene: with min assist;sit to/from stand Additional ADL Goal #1: Pt to tolerate sitting EOB 10 min with supervision and 0 instances of LOB, in preparation for ADLs.  OT Frequency: Min 2X/week   Barriers to D/C:            Co-evaluation PT/OT/SLP Co-Evaluation/Treatment: Yes Reason for Co-Treatment: For patient/therapist safety;To address functional/ADL transfers   OT goals addressed during session: ADL's and self-care;Strengthening/ROM      AM-PAC OT "6 Clicks" Daily Activity     Outcome Measure Help from another person eating meals?: A Little Help from another person taking care of personal grooming?: A Little Help from another person toileting, which includes using toliet, bedpan, or urinal?: A Lot Help from another person bathing (including washing, rinsing, drying)?: A Lot Help from another person to put on and taking off regular upper body clothing?: A Lot Help from another person to put on and taking off regular lower body clothing?: A Lot 6 Click Score: 14   End of Session Equipment Utilized During Treatment: Rolling walker;Oxygen Nurse Communication: Mobility status  Activity Tolerance: Patient tolerated treatment well Patient left: in chair;with call bell/phone within reach;with chair alarm set  OT Visit Diagnosis: Unsteadiness on feet (R26.81);Muscle weakness (generalized) (M62.81)                Time: 7341-9379 OT Time Calculation (min): 31 min Charges:  OT General Charges $OT Visit: 1 Visit OT Evaluation $OT Eval Moderate Complexity: 1 [address] OTR/L [phone]  Mauri [place] 09/24/2019, 3:20 PM

## 2019-09-24 NOTE — TOC Initial Note (Signed)
Transition of Care [facility]) - Initial/Assessment Note    Patient Details  Name: Valerie Terrell MRN: [id number] Date of Birth: 06/15/1932  [facility] CM/SW Contact:    Carles Collet, RN Phone Number: 09/24/2019, 3:45 PM  Clinical Narrative:        [name] w patient's daughter [name]. She states that she has been at hospital and [facility] w patient and is aware of level of support that patient will need at home. She states that the patient does not want to return to SNF and she is willing to take her home to her house. Address in chart verified.  Patient has hospital bed, BSC, RW, WC transfer chair, tub bench at home.  Patient will need home oxygen. We discussed O2 providers that would be able to support a lightweight POC that would be useful for HD 4x Week. Requested RN to get ambulatory sat note. Referral made to [place].  We discussed [place] services.  Patient was active w [facility] last month prior to going to [place].  Please place referral to [facility].  Patient is active w [facility] for palliative services, these will continue after DC, [facility] notified.   Information for transportation services w [facility] Medicare discussed w daughter and left at bedside.  Daughter states she will provide transport home in AM.             Expected Discharge Plan: [place] Barriers to Discharge: Continued Medical Work up   Patient Goals and CMS Choice Patient states their goals for this hospitalization and ongoing recovery are:: to go home w daughter CMS Medicare.gov Compare Post Acute Care list provided to:: Other (Comment Required) Choice offered to / list presented to : Adult Children  Expected Discharge Plan and Services Expected Discharge Plan: [place]   Discharge Planning Services: CM Consult Post Acute Care Choice: Home Health, Durable Medical Equipment Living arrangements for the past 2 months: Single Family Home                 DME  Arranged: Oxygen DME Agency: [employer] Date DME Agency Contacted: 09/24/19 Time DME Agency Contacted: [phone] Representative spoke with at DME Agency: Caryl Pina HH Arranged: RN, PT, OT, Nurse's Aide, Social Work [employer] Agency: [facility] [employer] [facility]        Prior Living Arrangements/Services Living arrangements for the past 2 months: [place] with:: Adult Children              Current home services: DME(Palliative w [employer])    Activities of Daily Living      Permission Sought/Granted                  Emotional Assessment              Admission diagnosis:  Pleural effusion [J90] Failure to thrive in adult [R62.7] Chest pain, unspecified type [R07.9] Patient Active Problem List   Diagnosis Date Noted  . Failure to thrive in adult 09/23/2019  . HAP (hospital-acquired pneumonia) 09/14/2019  . Acute encephalopathy 09/14/2019  . Palliative care by specialist   . Goals of care, counseling/discussion   . DNR (do not resuscitate)   . Inadequate pain control   . Compression fracture of T11 vertebra ([place]) 09/01/2019  . Acute diverticulitis 09/01/2019  . Diverticulitis 08/31/2019  . Pressure injury of skin 08/17/2019  . Volume overload 08/16/2019  . Disorder of phosphorus metabolism, unspecified 05/15/2019  . Hypoxemia 03/24/2019  .  Anaphylactic shock, unspecified, sequela 02/23/2019  . Heterotopic ossification 12/29/2018  . Pain, unspecified 10/15/2018  . Hip fracture ([place]) 10/04/2018  . Fracture of pubic ramus ([place]) 07/19/2018  . Fracture of sacrum ([place]) 07/19/2018  . Perineal hematoma 07/19/2018  . Fever 07/19/2018  . Acute blood loss anemia 07/19/2018  . ESRD (end stage renal disease) ([place]) 05/20/2018  . Chronic back pain 05/14/2018  . Chronic pain syndrome 05/14/2018  . Iron deficiency anemia, unspecified 04/25/2018  . Hypoxia 12/14/2017  . Dyspnea 12/14/2017  . Secondary hyperparathyroidism of renal origin ([place])  08/16/2017  . Degeneration of thoracic intervertebral disc 07/26/2017  . Moderate protein-calorie malnutrition ([place]) 02/23/2017  . Acute respiratory failure with hypoxia ([place]) 02/13/2017  . Hyperkalemia 02/13/2017  . ESRD on dialysis ([place]) 02/11/2017  . Chronic respiratory failure with hypoxia ([place]) 02/11/2017  . Weakness generalized 02/07/2017  . Poor tolerance for ambulation 02/07/2017  . Anemia in chronic kidney disease 02/04/2017  . Coagulation defect, unspecified ([place]) 02/04/2017  . Fracture of one rib, right side, initial encounter for closed fracture 01/11/2017  . Closed fracture of one rib of right side   . Chronic combined systolic and diastolic heart failure ([place]) 01/05/2017  . Palpitations 12/15/2016  . Hypokalemia 12/14/2016  . Leukocytosis 12/14/2016  . Long term (current) use of anticoagulants [Z79.01] 12/11/2016  . Non-ischemic cardiomyopathy ([place]) 12/07/2016  . Acute combined systolic and diastolic heart failure ([place])  12/07/2016  . Cardiac pacemaker   . Acute on chronic respiratory failure with hypoxia ([place])   . Acute pulmonary edema with congestive heart failure ([place]) 11/30/2016  . Elevated troponin level 11/30/2016  . Anemia 11/30/2016  . Chronic midline thoracic back pain 09/15/2016  . History of CVA (cerebrovascular accident) 09/15/2016  . Hyperlipidemia   . Nausea & vomiting 08/05/2016  . Dehydration 08/05/2016  . Osteoporosis 07/28/2016  . Closed compression fracture of thoracic vertebra ([place]) 07/23/2016  . Thoracic compression fracture ([place]) 07/22/2016  . Flank pain 07/11/2016  . Hypoalbuminemia 07/11/2016  . Renal artery stenosis ([place]) 06/24/2016  . Nondisplaced fracture of fifth metatarsal bone, left foot, initial encounter for closed fracture 06/10/2016  . Rib contusion, left, initial encounter 06/10/2016  . Single kidney 03/13/2016  . Left-sided chest pain   . Dyslipidemia   . PAF (paroxysmal atrial fibrillation) ([place]) 01/28/2016  . Essential  hypertension 01/28/2016  . History of cardioembolic stroke 68/34/1962  . Personal history of subarachnoid hemorrhage 01/28/2016  . CKD (chronic kidney disease), stage IV ([place]) 01/28/2016  . Gait disturbance 01/27/2016  . Mitral valve disease 01/08/2014  . Colon polyps 07/10/2013  . Lumbar radiculopathy 02/04/2011   PCP:  Tammi Sou, MD Pharmacy:  No Pharmacies Listed    Social Determinants of Health (SDOH) Interventions    Readmission Risk Interventions Readmission Risk Prevention Plan 10/06/2018  Transportation Screening Complete  Medication Review ([place]) Complete  PCP or Specialist appointment within 3-5 days of discharge Complete  HRI or [place] Complete  SW Recovery Care/Counseling Consult Complete  Palliative Care Screening Not [place] Complete  Some recent data might be hidden

## 2019-09-24 NOTE — Progress Notes (Signed)
[facility] Progress Note   Subjective:  Patient seen and examined at bedside with daughter present.  Feeling better today.  Denies current back pain, CP, SOB, n/v/d, abdominal pain and dizziness.  Admits to headache, weakness and fatigue.    Objective Vitals:   09/23/19 2330 09/24/19 0002 09/24/19 0032 09/24/19 0527  BP: (!) 90/47 (!) 122/50 124/73 132/62  Pulse: 92 93 80 85  Resp: 16 16 17 18   Temp:  97.6 F (36.4 C) 99.6 F (37.6 C) 97.9 F (36.6 C)  TempSrc:  Axillary Axillary Oral  SpO2:   100% 98%  Weight:  46.8 kg 47.1 kg 47.2 kg  Height:       Physical Exam General:chronically ill appearing female in NAD Heart:RRR Lungs: R clear, BS decreased on L, nml WOB Abdomen:soft, NTND Extremities:trace LE edema Dialysis Access: AVG +b   Filed Weights   09/24/19 0002 09/24/19 0032 09/24/19 0527  Weight: 46.8 kg 47.1 kg 47.2 kg    Intake/Output Summary (Last 24 hours) at 09/24/2019 1102 Last data filed at 09/24/2019 0002 Gross per 24 hour  Intake --  Output 2000 ml  Net -2000 ml    Additional Objective Labs: Basic Metabolic Panel: Recent Labs  Lab 09/18/19 0354 09/23/19 1019 09/24/19 0543  NA 136 135 137  K 3.4* 4.8 3.7  CL 97* 93* 96*  CO2 27 31 29   GLUCOSE 80 91 85  BUN 17 15 <5*  CREATININE 3.74* 4.41* 2.06*  CALCIUM 8.1* 8.8* 8.0*   Liver Function Tests: Recent Labs  Lab 09/23/19 1019  AST 19  ALT 10  ALKPHOS 89  BILITOT 0.6  PROT 5.9*  ALBUMIN 2.0*   Recent Labs  Lab 09/23/19 1019  LIPASE 104*   CBC: Recent Labs  Lab 09/18/19 0354 09/23/19 1019 09/24/19 0543  WBC 10.6* 14.1* 10.7*  NEUTROABS  --  12.3*  --   HGB 9.4* 10.9* 10.1*  HCT 29.8* 35.3* 33.5*  MCV 94.9 100.6* 100.0  PLT 220 289 241   Studies/Results: CT Abdomen Pelvis Wo Contrast  Result Date: 09/23/2019 CLINICAL DATA:  Left chest pain and left shoulder pain. History of recently treated pneumonia. Patient on dialysis. EXAM: CT ABDOMEN AND PELVIS WITHOUT  CONTRAST TECHNIQUE: Multidetector CT imaging of the abdomen and pelvis was performed following the standard protocol without IV contrast. COMPARISON:  August 31, 2019 FINDINGS: Lower chest: Enlarged heart. Calcific atherosclerotic disease of the coronary arteries and aorta. Bilateral small pleural effusions. Left greater than right lung base airspace consolidation versus atelectasis. Hepatobiliary: No focal liver abnormality is seen. Layering gallbladder sludge/stones without secondary signs of acute cholecystitis. Pancreas: Unremarkable. No pancreatic ductal dilatation or surrounding inflammatory changes. Spleen: Normal in size without focal abnormality. Adrenals/Urinary Tract: Adrenal glands are unremarkable. Atrophic kidneys. Bilateral renal cysts. Stomach/Bowel: Stomach is within normal limits. No evidence of bowel wall thickening, distention, or inflammatory changes. Diffuse colonic diverticulosis without evidence of acute diverticulitis. Vascular/Lymphatic: Severe atherosclerosis and ectasia of the abdominal aorta. No enlarged abdominal or pelvic lymph nodes. Reproductive: Status post hysterectomy. No adnexal masses. Other: No abdominal wall hernia or abnormality. No abdominopelvic ascites. Musculoskeletal: Chronic T12 compression fracture post interval kyphoplasty. Chronic T9 compression fracture post kyphoplasty without significant alignment changes. IMPRESSION: 1. Bilateral small pleural effusions. Left greater than right lung base infectious consolidation versus atelectasis. 2. Cholelithiasis without secondary signs of acute cholecystitis. 3. Atrophic kidneys consistent with chronic renal insufficiency. 4. Diffuse colonic diverticulosis without evidence of acute diverticulitis. 5. Severe atherosclerosis and ectasia of  the abdominal aorta. 6. Post interval kyphoplasty of T12 compression fracture without significant alignment changes. Aortic Atherosclerosis (ICD10-I70.0). Electronically Signed   By: Fidela Salisbury M.D.   On: 09/23/2019 11:47   DG Chest Port 1 View  Result Date: 09/23/2019 CLINICAL DATA:  Chest pain, recent pneumonia. EXAM: PORTABLE CHEST 1 VIEW COMPARISON:  Prior chest radiographs 09/14/2019 and earlier FINDINGS: The cardiac silhouette is partially obscured, limiting evaluation of heart size. Aortic atherosclerosis. Redemonstrated opacity within the left mid to lower lung field consistent with pleural effusion with atelectasis and/or consolidation. Background interstitial prominence may reflect interstitial edema. No right pleural effusion or evidence of pneumothorax. Redemonstrated left chest pacer device. No acute bony abnormality identified. Sequela of prior vertebral augmentation at multiple levels. IMPRESSION: Increased opacity within the left mid to lower lung field consistent with pleural effusion with underlying atelectasis and/or consolidation. Radiographic follow-up to resolution is recommended. Unchanged cardiomegaly with probable mild background interstitial edema. Electronically Signed   By: Kellie Simmering DO   On: 09/23/2019 11:13    Medications:  . amiodarone  200 mg Oral Daily  . aspirin EC  325 mg Oral QHS  . calcitonin (sal[place])  1 spray Alternating Nares Daily  . Chlorhexidine Gluconate Cloth  6 each Topical Q0600  . doxercalciferol  2.5 mcg Oral Once per day on Tue Thu Sat  . guaiFENesin  600 mg Oral Daily  . heparin  5,000 Units Subcutaneous Q8H  . isosorbide mononitrate  60 mg Oral q1800  . lidocaine-prilocaine  1 application Topical See admin instructions  . pravastatin  40 mg Oral QHS  . sevelamer carbonate  1,600 mg Oral TID WC  . traZODone  50 mg Oral QHS    Dialysis Orders: [facility] - MTTS AVG 4hrs 350/600 2K 2Ca  Hectorol 44mcg Hep 1400 units mircera 60 q2wks - not given   Assessment/Plan: 1. FTT - multiple recent admission/ER visits, palliative care consulting - interested in CIR 2. L sided chest pain - CXR/CT w/slightly larger pleural effusion.  Per PCCM unlikely to benefit from throa. ?worsening PNA. Per primary  3. Back pain/Hx T11 compression fracture - back pain improved 2. ESRD - on HD MTTS.  Orders written for HD tomorrow per regular schedule. K 3.7 3. Anemia of CKD- Hgb 10.1.  Aranesp last dosed 5/4. 4. Secondary hyperparathyroidism - Ca in goal. Will check phos.  Continue binders and VDRA. 5. HTN/volume - BP well controlled today. Volume status improved from usual baseline as OP.  Slightly larger pleural effusions seen on CT, titrate down as tolerated.  6. Nutrition - Renal diet w/fluid restrictions.    Jen Mow, PA-C [facility] Pager: [phone] 09/24/2019,11:02 AM  LOS: 0 days

## 2019-09-24 NOTE — Progress Notes (Signed)
Palliative Medicine Inpatient Consult Note  Reason for consult:  Discuss goals of care in the setting of failure to thrive  HPI:  Per intake H&P --> Valerie Terrell is a 86 y.o. female with medical history significant of chronic back pain; CVA; pacemaker placement; afib, not on AC due to falls; HTN; HLD; ESRD on TTS HD; and chronic combined CHF presenting with L-sided CP.  She has had 4 ER visits and 4 admissions in the last 6 months.  Her most recent admission was 4/29-5/5 for LLL CAP, treated with Rocephin.  She was discharged late Wednesday and went to [facility] for short-term rehab.  Her daughter went to check on her this morning - sleeping and woke up with terrible chest pain down her left arm, not sleeping well, "everything hurt."  No cough or SOB.  She has been on O2 since the last hospitalization, still 2.5 L since last hospitalization.     Palliative care was involved upon [name] last two admissions to aid in symptom management and ongoing [place] conversations. Shykeria presents today from [facility] for left sided chest pain. Per primary team she has had more pronounced failure to thrive.   Today's Discussion (09/24/2019): I have reviewed medical records including EPIC notes, labs and imaging, received report from bedside RN, assessed the patient.   I met with Valerie Terrell at bedside this morning. Valerie Terrell was just getting changed. She said that overall she had a good night. She feels like her breathing is improved. She denies chest pain or nausea this morning.   Nursing expressed concern that the oxycodone 54m PO Q6H PRN may have been too much she had also receive ambien 571movernight. Made adjustments as appropriate.   Discussed the importance of continued conversation with family and their medical providers regarding overall plan of care and treatment options, ensuring decisions are within the context of the patients values and GOCs.  Decision Maker: Valerie BoulayDaughter) [phone] OF RECOMMENDATIONS DNAR/DNI  Treat what is treatable for the time being   TOC --> [facility]  ChPaxDNAR/DNI  Symptom Management: Failure to Thrive:  - Dietician consulted for calorie count  - Encourage 1:1 feeding  - Supplemental nutrients w/ Nepro Carb Steady  Lower Back pain:  - Continue Oxycodone 10-2092mO Q6H PRN  - Fentanyl 70m43m2H PRN if orals are not effective  Muscular Weakness: - PT/OT consults, [facility]  Insomnia:  - Stop Ambien  -  Trazodone 50mg66mQHS  Delirium: - Delirium precautions - Get up during the day - Encourage a familiar face to remain present throughout the day - Keep blinds open and lights on during daylight hours - Minimize the use of opioids/benzodiazepines  Spiritual: - Chaplain consult  Need for emotional support: - Utilize therapeutic listening - Ensure safe space to express concerns  Palliative Prophylaxis:  Constipation, oral care, repositioning Q2H, delirium   Additional Recommendations (Limitations, Scope, Preferences):  Treat what is treatable  Psycho-social/Spiritual:  Desire for further Chaplaincy support:Yes  Additional Recommendations:Education hospice care  Prognosis: Poor in the setting of worsening debility and now failure to thrive  Discharge Planning:Unclear, patient interested in being evaluated for CIR  PPS: 30%  Total Time: 25 Greater than 50%  of this time was spent counseling and coordinating care related to the  above assessment and plan.  [place] Team Team Cell Phone: [phone] Please utilize secure chat with additional questions, if there is no response within 30 minutes please call the above phone number  Palliative Medicine Team providers are available by phone from 7am to 7pm daily and can be reached through the team cell phone.  Should this patient require assistance outside of these hours, please call the patient's attending physician.

## 2019-09-24 NOTE — Progress Notes (Signed)
[facility] Documentation    Pt is a current pt in [facility] Palliative program. Liaison to follow pt through course of hospital stay to ensure follow up with Palliative NP upon discharge.     Please outreach with any questions.     Thank you,   Jennifer Love, RN  [facility] Hospital Liaison  [phone]   

## 2019-09-24 NOTE — Evaluation (Signed)
Clinical/Bedside Swallow Evaluation Patient Details  Name: Valerie Terrell MRN: [id number] Date of Birth: Mar 06, 1933  Today's Date: 09/24/2019 Time: SLP Start Time (ACUTE ONLY): 1000 SLP Stop Time (ACUTE ONLY): 1023 SLP Time Calculation (min) (ACUTE ONLY): 23 min  Past Medical History:  Past Medical History:  Diagnosis Date  . Anemia of chronic disease 2018   + anemia of CRI?  [name] Atrophy of left kidney    with absent blood flow by renal artery dopplers (Dr. Gwenlyn Found)  . 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 Chronic combined systolic and diastolic CHF (congestive heart failure) ([place])    a. 11/2016: echo showing EF of 35-40%, RV strain noted, mild MR and mild TR.   Echo showed much improved EF 12/2017 (55-60%)  . Chronic respiratory failure with hypoxia ([place]) 02/11/2017   Now on chronic O2 (intermittent as of summer 2019).    . Closed fracture of right superior pubic ramus ([place]) 07/18/2018   Non op mgmt (Dr. Victorino December was ortho who saw her in hosp but i don't think pt followed up with him).  . Closed right hip fracture ([place]) 10/04/2018   ORIF  . Debilitated patient    WC dependent as of 2018.  Hosp bed + full assistance with most ADLs s/p hip fx surg 09/2018.  [name] ESRD on hemodialysis ([place]) 01/2017   T/Th/Sat schedule- [place].  Right basilic AV fistula 56/2130.  Graft 2019.  [name] History of adenomatous polyp of colon   . History of kidney stones   . History of subarachnoid hemorrhage 10/2014   after syncope and while on xarelto  . Hyperlipidemia   . Hypertension    Difficult to control, in the setting of one functioning kidney: pt was referred to nephrology by Dr. Gwenlyn Found 06/2015.  . Lumbar radiculopathy 2012  . Lumbar spinal stenosis 2019   facet injections only very short term relief.  L1 and L2 selective nerve root blocks helpful 04/2018.  . Lumbar spondylosis    MR 07/2016---no sign of spinal nerve compression or cord compression.  Pt set up with outpt ortho  while admitted to hosp 07/2016.  . Malnourished ([place])   . Metatarsal fracture 06/10/2016   Nondisplaced, left 5th metatarsal--pt was referred to ortho  . Osteopenia 2014   T-score -2.1  . PAF (paroxysmal atrial fibrillation) ([facility])    Eliquis started after BRAO and CVA.  She was changed to warfarin 2018. All anticoag stopped due to recurrent falls.  . Presence of permanent cardiac pacemaker   . Right rib fracture 12/2016   s/p fall  . Sick sinus syndrome ([place]) 11/2016   Dual chamber pacer insertion 2018 (Dr. Lovena Le)  . Stroke ([place]) 86/5784   cardioembolic (had CVA while on no anticoag)--"scattered subacute punctate infarcts: 1 in R parietal lobe and 2 in occipital cortex" on MRI br.  CT angio head/neck: aortic arch athero.  R ICA 20% stenosis, L ICA w/out any stenosis.  . Thoracic back pain 01/2017; 01/2018   2018: Facet?  Dr. Ramos->steroid injection.  01/2018-->CT T spine to check for a new comp fx-->none found.  Selective nerve root inj in T spine helpful on R side.   . Thoracic compression fracture ([place]) 07/2016   T3.  T7 and T8-- T8 kyphoplasty during hosp admission 07/2016.  Neuro referred pt to pain mgmt for consideration of injection 09/2016.  I referred her to endo 08/2016 for consideration of calcitonin treatment.   Past Surgical History:  Past Surgical  History:  Procedure Laterality Date  . APPENDECTOMY  child  . AV FISTULA PLACEMENT Right 03/31/2017   Procedure: Right ARM Basilic vein transposition;  Surgeon: Conrad Raymond, MD;  Location: [facility] OR;  Service: Vascular;  Laterality: Right;  . AV FISTULA PLACEMENT Right 12/06/2017   Procedure: INSERTION OF ARTERIOVENOUS (AV) GORE-TEX GRAFT ARM RIGHT UPPER EXTREMITY;  Surgeon: [name], MD;  Location: [facility];  Service: Vascular;  Laterality: Right;  . CARDIOVASCULAR STRESS TEST  01/31/2016   Stress myoview: NORMAL/Low risk.  EF 56%.  . [place]  . COLONOSCOPY  2015   + hx of adenomatous polyps.  Need digest health spec  in [place] [facility] records to see when pt due for next colonoscopy  . DECLOT CKV Right 03/21/2019  . EYE SURGERY Bilateral    Catarct  . INSERTION OF DIALYSIS CATHETER Right 01/29/2017   Procedure: INSERTION OF DIALYSIS CATHETER- RIGHT INTERNAL JUGULAR;  Surgeon: [name], MD;  Location: [facility];  Service: Vascular;  Laterality: Right;  . INTRAMEDULLARY (IM) NAIL INTERTROCHANTERIC Right 10/04/2018   Procedure: INTRAMEDULLARY (IM) NAIL INTERTROCHANTRIC;  Surgeon: Nicholes Stairs, MD;  Location: [place] [facility];  Service: Orthopedics;  Laterality: Right;  . IR GENERIC HISTORICAL  07/24/2016   IR KYPHO THORACIC WITH BONE BIOPSY 07/24/2016 Luanne Bras, MD [facility]-INTER[facility] RAD  . IR KYPHO THORACIC WITH BONE BIOPSY  09/06/2019  . KYPHOPLASTY  07/27/2016   T8  . PACEMAKER IMPLANT N/A 12/03/2016   Procedure: Pacemaker Implant;  Surgeon: Evans Lance, MD;  Location: [facility] CV LAB;  Service: Cardiovascular;  Laterality: N/A;  . Renal artery dopplers  02/26/2016   Her right renal dimension was 11 cm pole to pole with mild to moderate right renal artery stenosis. A right renal aortic ratio was 3.22 suggesting less than a 50% stenosis.  . Right upper arm AV Gore-Tex graft  12/06/2017   Dr. Donnetta Hutching  . TONSILLECTOMY    . TRANSTHORACIC ECHOCARDIOGRAM  01/29/2016; 11/2016; 12/2017   2017: EF 55-60%, normal LV wall motion, grade I DD.  No cardiac source of emboli was seen.  2018: EF 35-40%, could not assess DD due to a-fib, pulm HTN noted. 12/2017 EF 55-60%, nl wall motion, grd II DD, mod MR and pulm regurg, increased pulm pressure (peak 52).   HPI:  Valerie Terrell is a 86 y.o. female with medical history significant of chronic back pain; CVA; pacemaker placement; afib, not on AC due to falls; HTN; HLD; ESRD on TTS HD; and chronic combined CHF presenting with L-sided CP.  She has had 4 ER visits and 4 admissions in the last 6 months.  Her most recent admission was 4/29-5/5 for LLL CAP, treated with Rocephin. Presented  from SNF with left sided chest pain. Also with failure to thrive. Per MD, CXR and CT equivocal for PNA, no obvious infectious symptoms (cough after eating, more suggestive of dysphagia so aspiration would be a consideration). Patient during recent admission, 09/04/19, by SLP who indicated a normal oropharyngeal swallow without need for SLP f/u.    Assessment / Plan / Recommendation Clinical Impression  As during previous evaluation 09/04/19, patient presents with normal oropharyngeal swallowing function without overt indication of aspiration. She was able to adequately masticate solids and consume thin liquid via multiple consecutive straw sips without indication of decreased airway protection. Note that patient fully alert during today's exam. Per daughter, patient presents with periods of oral holding, decreased awareness, and coughing after po intake when  mentation and alertness fluctuate, primarily on HD days is seems. In ligiht of recurrent PNAs, daughter is concerned regarding aspiration. SLP expressed understanding of concern and presented possiblity of MBS however per daughter, patient will likely not participate (SLP in agreement). Daughter also reported and demonstrated significant stress regarding the fact that she is patient's primary caregiver and only wants to do what is best for her mother. Given PNA and reported flucutations in mentation, is it is warranted for SLP to continue to f/u for diagnostic treatment, particularly on HD days, to monitor for changes in swallowing function and assist daughter with any consistency changes or feeding techniques which may help to mitigate aspiration risk during these times. Daughter appreciative and in agreement.  SLP Visit Diagnosis: Dysphagia, unspecified (R13.10)    Aspiration Risk  Mild aspiration risk    Diet Recommendation Regular;Thin liquid   Liquid Administration via: Cup;Straw Medication Administration: Whole meds with liquid Supervision:  Staff to assist with self feeding Compensations: Slow rate;Small sips/bites(feed only when fully alert) Postural Changes: Seated upright at 90 degrees    Other  Recommendations Oral Care Recommendations: Oral care BID   Follow up Recommendations None      Frequency and Duration min 2x/week  1 week       Prognosis        Swallow Study   General HPI: Zabria Liss is a 86 y.o. female with medical history significant of chronic back pain; CVA; pacemaker placement; afib, not on AC due to falls; HTN; HLD; ESRD on TTS HD; and chronic combined CHF presenting with L-sided CP.  She has had 4 ER visits and 4 admissions in the last 6 months.  Her most recent admission was 4/29-5/5 for LLL CAP, treated with Rocephin. Presented from SNF with left sided chest pain. Also with failure to thrive. Per MD, CXR and CT equivocal for PNA, no obvious infectious symptoms (cough after eating, more suggestive of dysphagia so aspiration would be a consideration). Patient during recent admission, 09/04/19, by SLP who indicated a normal oropharyngeal swallow without need for SLP f/u.  Type of Study: Bedside Swallow Evaluation Previous Swallow Assessment: see HPI Diet Prior to this Study: Regular;Thin liquids Temperature Spikes Noted: No Respiratory Status: Nasal cannula History of Recent Intubation: No Behavior/Cognition: Alert;Pleasant mood;Uncooperative Oral Cavity Assessment: Within Functional Limits Oral Care Completed by SLP: No Oral Cavity - Dentition: Adequate natural dentition Vision: Functional for self-feeding Self-Feeding Abilities: Needs assist Patient Positioning: Upright in bed Baseline Vocal Quality: Normal Volitional Cough: Strong Volitional Swallow: Able to elicit    Oral/Motor/Sensory Function Overall Oral Motor/Sensory Function: Within functional limits   Ice Chips Ice chips: Not tested   Thin Liquid Thin Liquid: Within functional limits Presentation: Straw    Nectar Thick  Nectar Thick Liquid: Not tested   Honey Thick Honey Thick Liquid: Not tested   Puree Puree: Within functional limits Presentation: Spoon   Solid     Solid: Within functional limits     Epiphany Seltzer MA, CCC-SLP   Abrina Petz Meryl 09/24/2019,10:30 AM

## 2019-09-24 NOTE — Progress Notes (Signed)
PROGRESS NOTE                                                                                                                                                                                                             Patient Demographics:    Valerie Terrell, is a 86 y.o. female, DOB - 1932/10/06, GNF:[id number]  Admit date - 09/23/2019   Admitting Physician Karmen Bongo, MD  Outpatient Primary MD for the patient is McGowen, Adrian Blackwater, MD  LOS - 0   Chief Complaint  Patient presents with  . Chest Pain       Brief Narrative  Valerie Terrell is a 86 y.o. female with medical history significant of chronic back pain; CVA; pacemaker placement; afib, not on AC due to falls; HTN; HLD; ESRD on TTS HD; and chronic combined CHF presenting with L-sided CP.  She has had 4 ER visits and 4 admissions in the last 6 months.  Her most recent admission was 4/29-5/5 for LLL CAP, treated with Rocephin.  She was discharged late Wednesday and went to [facility] for short-term rehab.  Her daughter went to check on her this morning - sleeping and woke up with terrible chest pain down her left arm, not sleeping well, "everything hurt."  No cough or SOB.  She has been on O2 since the last hospitalization, still 2.5 L since last hospitalization.    CP started in the left rib area and radiated down the left arm.  The pain has resolved after oxycodone, fentanyl.  Thursday, she had HD and she "was pitiful after dialysis" - weak and out of it more than normal.  She was talking with palliative care NP.  Yesterday was a bit better, able to do a little PT/OT.   She was able to get OOB to bathroom.     ED Course:  Discharged recently to [facility].  C/o L chest pain, moaning, diffuse discomfort.  Calmer now.  CXR with ?worsening effusion/consolidation with elevated WBC count.     Subjective:    Valerie Terrell today reports she is tired, weak, shortness of breath, and requested to  be left alone so she can sleep .   Assessment  & Plan :    Principal Problem:   Failure to thrive in adult Active Problems:   PAF (paroxysmal atrial fibrillation) ([place])  Left-sided chest pain   Dyslipidemia   Chronic combined systolic and diastolic heart failure (HCC)   ESRD on dialysis ([place])   Chronic back pain  Failure to thrive/severe debility -Patient with multiple recent hospitalizations and ER visits -She is enrolled with outpatient palliative care as well as having palliative care consultations while here in the hospital -She was previously living at home with her daughter as her caregiver but was discharged to SNF rehab on 5/5. -This has failed and her daughter and the patient prefer that she return home -Patient with significant multiple comorbidities, and extremely frail at baseline, and she is on hemodialysis 4 days a week, she requires significant amount of care, palliative medicine consulted, goals of care is to treat with this treatable currently, I have discussed with daughter at length today, and they do not want to go back to [facility], and they would like to go home, patient will need to resume her palliative care through [facility], will consult TOC to 5 days and to resume. -Has a significant amount of dyspnea secondary to her chronic left pleural effusion, and she will need oxygen on discharge, TOC has been consulted to arrange.   Left-sided CP -Patient complained this AM of left-sided CP with radiation along the left arm -ACS ruled out, EKG nonacute, negative troponins x2, this is most likely musculoskeletal, may be related to her chronic left pleural effusion. -Admitting physician D/w pulmonology to see if thoracentesis is reasonable - unlikely large enough for drainage per PCCM, looks like atelectasis  -Continue [place] O2  ESRD on MTTS HD patient -Patient has chronic pain and so difficulty sitting in the chair and also recurrent hypotension -As such, it is  difficult for them to draw enough fluid off her -She is now doing HD 4 days a week -Nephrology prn order set utilized -Nephrology is awarethat patient will need HD -Continue Hectorol  Chronic back pain -I have reviewed this patient in the [place] Controlled Substances Reporting System.she is receiving medications from only one provider and appears to be taking them as prescribed. -She is not at particularly high risk of opioid misuse, diversion, or overdose. -Continue prn oxycodone without escalation of pain control -She was previously on Fentanyl patch but this appears to have been discontinued at some point since I last admitted her on 3/31 -Continue Miacalcin, Renvela  Afib, not on [facility] -Has pacemaker -Rate control with amiodarone -not on AC due to falls  H/o CVA -Continue ASA, Imdur  HTN -No longer taking medications for this issue  HLD -Continue Pravachol  Chronic combined CHF -11/2017 echo with preserved EF and grade 2 diastolic dysfunction -Volume control with HD    COVID-19 Labs  No results for input(s): DDIMER, FERRITIN, LDH, CRP in the last 72 hours.  Lab Results  Component Value Date   [place] NEGATIVE 09/23/2019   [place] NEGATIVE 09/18/2019   [place] NEGATIVE 09/14/2019   [place] NEGATIVE 09/07/2019     Code Status : DNR  Family Communication  : Discussed with daughter  Disposition Plan  :  Status is: Observation  The patient remains observation appropriate.   Dispo: The patient is from: Home              Anticipated d/c is to: Home              Anticipated d/c date is: 1 day              Patient currently is medically stable to d/c.  Consults  :  Palliative, renal   Procedures  : None  DVT Prophylaxis  :  [place] heparin  Lab Results  Component Value Date   PLT 241 09/24/2019    Antibiotics  :    Anti-infectives (From admission, onward)   None        Objective:   Vitals:   09/23/19 2330 09/24/19  0002 09/24/19 0032 09/24/19 0527  BP: (!) 90/47 (!) 122/50 124/73 132/62  Pulse: 92 93 80 85  Resp: 16 16 17 18   Temp:  97.6 F (36.4 C) 99.6 F (37.6 C) 97.9 F (36.6 C)  TempSrc:  Axillary Axillary Oral  SpO2:   100% 98%  Weight:  46.8 kg 47.1 kg 47.2 kg  Height:        Wt Readings from Last 3 Encounters:  09/24/19 47.2 kg  09/18/19 51 kg  09/09/19 49.9 kg     Intake/Output Summary (Last 24 hours) at 09/24/2019 1336 Last data filed at 09/24/2019 0002 Gross per 24 hour  Intake --  Output 2000 ml  Net -2000 ml     Physical Exam  Awake Alert, extremely frail and debilitated female laying in bed. Symmetrical Chest wall movement, diminished air entry in the left lung RRR,No Gallops,Rubs or new Murmurs, No Parasternal Heave +ve B.Sounds, Abd Soft, No tenderness, No rebound - guarding or rigidity. No Cyanosis, Clubbing or edema, No new Rash or bruise      Data Review:    CBC Recent Labs  Lab 09/18/19 0354 09/23/19 1019 09/24/19 0543  WBC 10.6* 14.1* 10.7*  HGB 9.4* 10.9* 10.1*  HCT 29.8* 35.3* 33.5*  PLT 220 289 241  MCV 94.9 100.6* 100.0  MCH 29.9 31.1 30.1  MCHC 31.5 30.9 30.1  RDW 16.3* 17.5* 17.4*  LYMPHSABS  --  0.5*  --   MONOABS  --  1.1*  --   EOSABS  --  0.1  --   BASOSABS  --  0.1  --     Chemistries  Recent Labs  Lab 09/18/19 0354 09/23/19 1019 09/24/19 0543  NA 136 135 137  K 3.4* 4.8 3.7  CL 97* 93* 96*  CO2 27 31 29   GLUCOSE 80 91 85  BUN 17 15 <5*  CREATININE 3.74* 4.41* 2.06*  CALCIUM 8.1* 8.8* 8.0*  AST  --  19  --   ALT  --  10  --   ALKPHOS  --  89  --   BILITOT  --  0.6  --    ------------------------------------------------------------------------------------------------------------------ No results for input(s): CHOL, HDL, LDLCALC, TRIG, CHOLHDL, LDLDIRECT in the last 72 hours.  Lab Results  Component Value Date   HGBA1C 5.2 12/15/2016    ------------------------------------------------------------------------------------------------------------------ No results for input(s): TSH, T4TOTAL, T3FREE, THYROIDAB in the last 72 hours.  Invalid input(s): FREET3 ------------------------------------------------------------------------------------------------------------------ No results for input(s): VITAMINB12, FOLATE, FERRITIN, TIBC, IRON, RETICCTPCT in the last 72 hours.  Coagulation profile No results for input(s): INR, PROTIME in the last 168 hours.  No results for input(s): DDIMER in the last 72 hours.  Cardiac Enzymes No results for input(s): CKMB, TROPONINI, MYOGLOBIN in the last 168 hours.  Invalid input(s): CK ------------------------------------------------------------------------------------------------------------------    Component Value Date/Time   BNP 1,660.5 (H) 08/16/2019 0758    Inpatient Medications  Scheduled Meds: . amiodarone  200 mg Oral Daily  . aspirin EC  325 mg Oral QHS  . calcitonin (salmon)  1 spray Alternating Nares Daily  . Chlorhexidine Gluconate Cloth  6 each Topical  [id number]  . Chlorhexidine Gluconate Cloth  6 each Topical Q0600  . doxercalciferol  2.5 mcg Oral Once per day on Tue Thu Sat  . guaiFENesin  600 mg Oral Daily  . heparin  5,000 Units Subcutaneous Q8H  . isosorbide mononitrate  60 mg Oral q1800  . lidocaine-prilocaine  1 application Topical See admin instructions  . pravastatin  40 mg Oral QHS  . sevelamer carbonate  1,600 mg Oral TID WC  . traZODone  50 mg Oral QHS   Continuous Infusions: PRN Meds:.acetaminophen **OR** acetaminophen, calcium carbonate (dosed in mg elemental calcium), camphor-menthol **AND** hydrOXYzine, docusate sodium, feeding supplement (NEPRO CARB STEADY), ondansetron **OR** ondansetron (ZOFRAN) IV, oxyCODONE, oxyCODONE, polyvinyl alcohol, sorbitol  Micro Results Recent Results (from the past 240 hour(s))  Respiratory Panel by RT PCR (Flu A&B,  Covid) - Nasopharyngeal Swab     Status: None   Collection Time: 09/14/19  6:46 PM   Specimen: Nasopharyngeal Swab  Result Value Ref Range Status   SARS Coronavirus 2 by RT PCR NEGATIVE NEGATIVE Final    Comment: (NOTE) SARS-CoV-2 target nucleic acids are NOT DETECTED. The SARS-CoV-2 RNA is generally detectable in upper respiratoy specimens during the acute phase of infection. The lowest concentration of SARS-CoV-2 viral copies this assay can detect is 131 copies/mL. A negative result does not preclude SARS-Cov-2 infection and should not be used as the sole basis for treatment or other patient management decisions. A negative result may occur with  improper specimen collection/handling, submission of specimen other than nasopharyngeal swab, presence of viral mutation(s) within the areas targeted by this assay, and inadequate number of viral copies (<131 copies/mL). A negative result must be combined with clinical observations, patient history, and epidemiological information. The expected result is Negative. Fact Sheet for Patients:  PinkCheek.be Fact Sheet for Healthcare Providers:  GravelBags.it This test is not yet ap proved or cleared by the [place] FDA and  has been authorized for detection and/or diagnosis of SARS-CoV-2 by FDA under an Emergency Use Authorization (EUA). This EUA will remain  in effect (meaning this test can be used) for the duration of the COVID-19 declaration under Section 564(b)(1) of the Act, 21 U.S.C. section 360bbb-3(b)(1), unless the authorization is terminated or revoked sooner.    Influenza A by PCR NEGATIVE NEGATIVE Final   Influenza B by PCR NEGATIVE NEGATIVE Final    Comment: (NOTE) The Xpert Xpress SARS-CoV-2/FLU/RSV assay is intended as an aid in  the diagnosis of influenza from Nasopharyngeal swab specimens and  should not be used as a sole basis for treatment. Nasal washings and   aspirates are unacceptable for Xpert Xpress SARS-CoV-2/FLU/RSV  testing. Fact Sheet for Patients: PinkCheek.be Fact Sheet for Healthcare Providers: GravelBags.it This test is not yet approved or cleared by the [place] FDA and  has been authorized for detection and/or diagnosis of SARS-CoV-2 by  FDA under an Emergency Use Authorization (EUA). This EUA will remain  in effect (meaning this test can be used) for the duration of the  Covid-19 declaration under Section 564(b)(1) of the Act, 21  U.S.C. section 360bbb-3(b)(1), unless the authorization is  terminated or revoked. Performed at [facility], [place] [address] [place], [place] [address]   Culture, blood (Routine X 2) w Reflex to ID Panel     Status: None   Collection Time: 09/15/19  6:42 AM   Specimen: BLOOD LEFT HAND  Result Value Ref Range Status   Specimen Description BLOOD LEFT HAND  Final  Special Requests   Final    BOTTLES DRAWN AEROBIC ONLY Blood Culture results may not be optimal due to an inadequate volume of blood received in culture bottles   Culture   Final    NO GROWTH 5 DAYS Performed at [facility], [place] [address], [place], [place] [address]    Report Status 09/20/2019 FINAL  Final  Culture, blood (Routine X 2) w Reflex to ID Panel     Status: None   Collection Time: 09/15/19 10:00 AM   Specimen: BLOOD LEFT HAND  Result Value Ref Range Status   Specimen Description BLOOD LEFT HAND  Final   Special Requests   Final    BOTTLES DRAWN AEROBIC AND ANAEROBIC Blood Culture adequate volume   Culture   Final    NO GROWTH 5 DAYS Performed at [facility], [place] [address], [place], [place] [address]    Report Status 09/20/2019 FINAL  Final  SARS CORONAVIRUS 2 (TAT 6-24 HRS) Nasopharyngeal Nasopharyngeal Swab     Status: None   Collection Time: 09/18/19  8:07 AM   Specimen: Nasopharyngeal Swab  Result Value Ref Range Status   SARS  Coronavirus 2 NEGATIVE NEGATIVE Final    Comment: (NOTE) SARS-CoV-2 target nucleic acids are NOT DETECTED. The SARS-CoV-2 RNA is generally detectable in upper and lower respiratory specimens during the acute phase of infection. Negative results do not preclude SARS-CoV-2 infection, do not rule out co-infections with other pathogens, and should not be used as the sole basis for treatment or other patient management decisions. Negative results must be combined with clinical observations, patient history, and epidemiological information. The expected result is Negative. Fact Sheet for Patients: SugarRoll.be Fact Sheet for Healthcare Providers: https://www.woods-mathews.com/ This test is not yet approved or cleared by the [place] FDA and  has been authorized for detection and/or diagnosis of SARS-CoV-2 by FDA under an Emergency Use Authorization (EUA). This EUA will remain  in effect (meaning this test can be used) for the duration of the COVID-19 declaration under Section 56 4(b)(1) of the Act, 21 U.S.C. section 360bbb-3(b)(1), unless the authorization is terminated or revoked sooner. Performed at [facility], [place] [address] [place], [place] [address]   Respiratory Panel by RT PCR (Flu A&B, Covid) - Nasopharyngeal Swab     Status: None   Collection Time: 09/23/19  1:39 PM   Specimen: Nasopharyngeal Swab  Result Value Ref Range Status   SARS Coronavirus 2 by RT PCR NEGATIVE NEGATIVE Final    Comment: (NOTE) SARS-CoV-2 target nucleic acids are NOT DETECTED. The SARS-CoV-2 RNA is generally detectable in upper respiratoy specimens during the acute phase of infection. The lowest concentration of SARS-CoV-2 viral copies this assay can detect is 131 copies/mL. A negative result does not preclude SARS-Cov-2 infection and should not be used as the sole basis for treatment or other patient management decisions. A negative result may occur  with  improper specimen collection/handling, submission of specimen other than nasopharyngeal swab, presence of viral mutation(s) within the areas targeted by this assay, and inadequate number of viral copies (<131 copies/mL). A negative result must be combined with clinical observations, patient history, and epidemiological information. The expected result is Negative. Fact Sheet for Patients:  PinkCheek.be Fact Sheet for Healthcare Providers:  GravelBags.it This test is not yet ap proved or cleared by the [place] FDA and  has been authorized for detection and/or diagnosis of SARS-CoV-2 by FDA under an Emergency Use Authorization (EUA). This  EUA will remain  in effect (meaning this test can be used) for the duration of the COVID-19 declaration under Section 564(b)(1) of the Act, 21 U.S.C. section 360bbb-3(b)(1), unless the authorization is terminated or revoked sooner.    Influenza A by PCR NEGATIVE NEGATIVE Final   Influenza B by PCR NEGATIVE NEGATIVE Final    Comment: (NOTE) The Xpert Xpress SARS-CoV-2/FLU/RSV assay is intended as an aid in  the diagnosis of influenza from Nasopharyngeal swab specimens and  should not be used as a sole basis for treatment. Nasal washings and  aspirates are unacceptable for Xpert Xpress SARS-CoV-2/FLU/RSV  testing. Fact Sheet for Patients: PinkCheek.be Fact Sheet for Healthcare Providers: GravelBags.it This test is not yet approved or cleared by the [place] FDA and  has been authorized for detection and/or diagnosis of SARS-CoV-2 by  FDA under an Emergency Use Authorization (EUA). This EUA will remain  in effect (meaning this test can be used) for the duration of the  Covid-19 declaration under Section 564(b)(1) of the Act, 21  U.S.C. section 360bbb-3(b)(1), unless the authorization is  terminated or revoked. Performed  at [facility], [place] [address], [place], [place] [address]   MRSA PCR Screening     Status: None   Collection Time: 09/24/19 12:44 AM   Specimen: Nasal Mucosa; Nasopharyngeal  Result Value Ref Range Status   MRSA by PCR NEGATIVE NEGATIVE Final    Comment:        The GeneXpert MRSA Assay (FDA approved for NASAL specimens only), is one component of a comprehensive MRSA colonization surveillance program. It is not intended to diagnose MRSA infection nor to guide or monitor treatment for MRSA infections. Performed at [facility], [place] [address], [place], [place] [address]     Radiology Reports CT Abdomen Pelvis Wo Contrast  Result Date: 09/23/2019 CLINICAL DATA:  Left chest pain and left shoulder pain. History of recently treated pneumonia. Patient on dialysis. EXAM: CT ABDOMEN AND PELVIS WITHOUT CONTRAST TECHNIQUE: Multidetector CT imaging of the abdomen and pelvis was performed following the standard protocol without IV contrast. COMPARISON:  August 31, 2019 FINDINGS: Lower chest: Enlarged heart. Calcific atherosclerotic disease of the coronary arteries and aorta. Bilateral small pleural effusions. Left greater than right lung base airspace consolidation versus atelectasis. Hepatobiliary: No focal liver abnormality is seen. Layering gallbladder sludge/stones without secondary signs of acute cholecystitis. Pancreas: Unremarkable. No pancreatic ductal dilatation or surrounding inflammatory changes. Spleen: Normal in size without focal abnormality. Adrenals/Urinary Tract: Adrenal glands are unremarkable. Atrophic kidneys. Bilateral renal cysts. Stomach/Bowel: Stomach is within normal limits. No evidence of bowel wall thickening, distention, or inflammatory changes. Diffuse colonic diverticulosis without evidence of acute diverticulitis. Vascular/Lymphatic: Severe atherosclerosis and ectasia of the abdominal aorta. No enlarged abdominal or pelvic lymph nodes. Reproductive: Status post  hysterectomy. No adnexal masses. Other: No abdominal wall hernia or abnormality. No abdominopelvic ascites. Musculoskeletal: Chronic T12 compression fracture post interval kyphoplasty. Chronic T9 compression fracture post kyphoplasty without significant alignment changes. IMPRESSION: 1. Bilateral small pleural effusions. Left greater than right lung base infectious consolidation versus atelectasis. 2. Cholelithiasis without secondary signs of acute cholecystitis. 3. Atrophic kidneys consistent with chronic renal insufficiency. 4. Diffuse colonic diverticulosis without evidence of acute diverticulitis. 5. Severe atherosclerosis and ectasia of the abdominal aorta. 6. Post interval kyphoplasty of T12 compression fracture without significant alignment changes. Aortic Atherosclerosis (ICD10-I70.0). Electronically Signed   By: Fidela Salisbury M.D.   On: 09/23/2019 11:47   CT ABDOMEN PELVIS WO CONTRAST  Result Date: 08/31/2019 CLINICAL DATA:  Acute on chronic back pain for several days, emesis, short of breath EXAM: CT ABDOMEN AND PELVIS WITHOUT CONTRAST TECHNIQUE: Multidetector CT imaging of the abdomen and pelvis was performed following the standard protocol without IV contrast. COMPARISON:  12/13/2018 FINDINGS: Lower chest: Hypoventilatory changes are seen at the lung bases. There is chronic scarring at the left lung base. Diffuse atherosclerosis of the aorta and coronary vessels again noted. Extensive calcification of the aortic and mitral valves. Pacemaker unchanged. Hepatobiliary: Gallbladder sludge is identified without cholecystitis. The liver is unremarkable. Pancreas: Unremarkable. No pancreatic ductal dilatation or surrounding inflammatory changes. Spleen: Normal in size without focal abnormality. Adrenals/Urinary Tract: Progressive bilateral renal cortical atrophy consistent with end-stage renal disease. Stable cysts right kidney. No urinary tract calculi or obstruction. Bladder is unremarkable. The  adrenals are unremarkable. Stomach/Bowel: There is circumferential wall thickening of the mid sigmoid colon, reference image 64. Mild pericolonic fat stranding consistent with acute colitis or diverticulitis. No bowel obstruction or ileus. The appendix is surgically absent. Vascular/Lymphatic: There is severe atherosclerosis throughout the aorta and its branches. No pathologic adenopathy. Reproductive: Uterus is markedly atrophic.  No adnexal masses. Other: No free fluid or free gas. Diffuse atrophy of the abdominal wall musculature unchanged. Musculoskeletal: Chronic T10 compression deformity with previous vertebral augmentation. There is an age-indeterminate T12 compression fracture, new since prior CT. Less than 25% loss of height. No significant paraspinal hematoma. No additional fractures. Postsurgical changes right hip. Reconstructed images demonstrate no additional findings. IMPRESSION: 1. Circumferential wall thickening of the mid sigmoid colon with mild pericolonic fat stranding, consistent with acute uncomplicated diverticulitis or colitis. 2. New T12 compression deformity, with less than 25% loss of height. Based on appearance, this could reflect an acute or subacute fracture. 3. Sequela of end-stage renal disease. 4. Aortic Atherosclerosis (ICD10-I70.0). Severe coronary artery atherosclerosis. Electronically Signed   By: Randa Ngo M.D.   On: 08/31/2019 19:37   DG Thoracic Spine W/Swimmers  Result Date: 09/10/2019 CLINICAL DATA:  Pain after fall.  Recent kyphoplasty. EXAM: THORACIC SPINE - 3 VIEWS COMPARISON:  None. FINDINGS: Patient is status post kyphoplasty at T12 and T9. Anterior wedging of L1, mild, is unchanged since the August 31, 2019 CT scan. Mild anterior wedging of T10 and T11 is also unchanged since the comparison CT scan. Mild anterior wedging of T8 is stable since February 2021. No other fractures are identified. Evaluation of the upper thoracic spine is somewhat limited. IMPRESSION:  The patient is status post kyphoplasties at T9 and T12. Anterior wedging at several levels is stable since comparison studies as above. No acute fractures or malalignment identified. Dense calcified atherosclerosis in the abdominal aorta. Electronically Signed   By: Dorise Bullion III M.D   On: 09/10/2019 09:25   DG Lumbar Spine Complete  Result Date: 09/10/2019 CLINICAL DATA:  Recent fall, post kyphoplasty. EXAM: LUMBAR SPINE - COMPLETE 4+ VIEW COMPARISON:  07/04/2019 FINDINGS: Interval T11 kyphoplasty. Osteopenia. Spinal degenerative changes. No signs of acute fracture. No subluxation. Extensive vascular calcifications project anterior to the abdominal aorta as before. IMPRESSION: Interval T11 kyphoplasty. No signs of acute fracture. Osteopenia and degenerative changes. Electronically Signed   By: Zetta Bills M.D.   On: 09/10/2019 09:26   CT Head Wo Contrast  Result Date: 09/14/2019 CLINICAL DATA:  Altered mental status. EXAM: CT HEAD WITHOUT CONTRAST TECHNIQUE: Contiguous axial images were obtained from the base of the skull through the vertex without intravenous contrast. COMPARISON:  September 09, 2019 FINDINGS:  Brain: There is mild cerebral atrophy with widening of the extra-axial spaces and ventricular dilatation. There are areas of decreased attenuation within the white matter tracts of the supratentorial brain, consistent with microvascular disease changes. Small chronic bilateral basal ganglia lacunar infarcts are seen. Vascular: No hyperdense vessel or unexpected calcification. Skull: Normal. Negative for fracture or focal lesion. Sinuses/Orbits: No acute finding. Other: None. IMPRESSION: 1. Generalized cerebral atrophy. 2. No acute intracranial abnormality. Electronically Signed   By: Virgina Norfolk M.D.   On: 09/14/2019 19:23   CT Head Wo Contrast  Result Date: 09/09/2019 CLINICAL DATA:  [place] from bed EXAM: CT HEAD WITHOUT CONTRAST TECHNIQUE: Contiguous axial images were obtained from the  base of the skull through the vertex without intravenous contrast. COMPARISON:  07/28/2017 FINDINGS: Brain: Chronic hypodensities are seen throughout the periventricular white matter and bilateral basal ganglia, consistent with chronic small vessel ischemic changes. Stable diffuse cerebral atrophy. No signs of acute infarct or hemorrhage. The lateral ventricles and remaining midline structures are unremarkable. No acute extra-axial fluid collections. No mass effect. Vascular: No hyperdense vessel or unexpected calcification. Skull: Normal. Negative for fracture or focal lesion. Sinuses/Orbits: No acute finding. Other: None. IMPRESSION: 1. No acute intracranial process. 2. Chronic small vessel ischemic changes and cerebral atrophy. Electronically Signed   By: Randa Ngo M.D.   On: 09/09/2019 21:44   CT Cervical Spine Wo Contrast  Result Date: 09/09/2019 CLINICAL DATA:  [place] from bed, chronic pain EXAM: CT CERVICAL SPINE WITHOUT CONTRAST TECHNIQUE: Multidetector CT imaging of the cervical spine was performed without intravenous contrast. Multiplanar CT image reconstructions were also generated. COMPARISON:  10/04/2018 FINDINGS: Alignment: Alignment is grossly anatomic. Skull base and vertebrae: No acute displaced fracture. Soft tissues and spinal canal: No prevertebral fluid or swelling. No visible canal hematoma. Disc levels: Mild diffuse cervical spondylosis with disc space narrowing and mild circumferential osteophyte formation at all levels. Changes are most pronounced at C5-6 and C6-7. No significant bony encroachment upon the central canal. There is bony fusion of the left C3/C4 facet, with uncovertebral hypertrophy and facet hypertrophy resulting in severe left neural foraminal narrowing. Upper chest: Airway is patent.  Lung apices are clear. Other: Reconstructed images demonstrate no additional findings. IMPRESSION: 1. No acute displaced fracture. 2. Multilevel cervical spondylosis. Electronically  Signed   By: Randa Ngo M.D.   On: 09/09/2019 21:50   DG Chest Port 1 View  Result Date: 09/23/2019 CLINICAL DATA:  Chest pain, recent pneumonia. EXAM: PORTABLE CHEST 1 VIEW COMPARISON:  Prior chest radiographs 09/14/2019 and earlier FINDINGS: The cardiac silhouette is partially obscured, limiting evaluation of heart size. Aortic atherosclerosis. Redemonstrated opacity within the left mid to lower lung field consistent with pleural effusion with atelectasis and/or consolidation. Background interstitial prominence may reflect interstitial edema. No right pleural effusion or evidence of pneumothorax. Redemonstrated left chest pacer device. No acute bony abnormality identified. Sequela of prior vertebral augmentation at multiple levels. IMPRESSION: Increased opacity within the left mid to lower lung field consistent with pleural effusion with underlying atelectasis and/or consolidation. Radiographic follow-up to resolution is recommended. Unchanged cardiomegaly with probable mild background interstitial edema. Electronically Signed   By: Kellie Simmering DO   On: 09/23/2019 11:13   DG Chest Port 1 View  Result Date: 09/14/2019 CLINICAL DATA:  Altered mental status. The fell and hit head on 04/24, followed by a normal head CT. EXAM: PORTABLE CHEST 1 VIEW COMPARISON:  08/31/2019 FINDINGS: Patient has LEFT-sided transvenous pacemaker with leads to the RIGHT atrium  and RIGHT ventricle. The heart is enlarged. There is increased opacity at the LEFT lung base, consistent with atelectasis or infiltrate. Suspect LEFT pleural effusion. The increased prominence of interstitial markings raises a question of interstitial edema. IMPRESSION: 1. Cardiomegaly and possible interstitial edema. 2. Increased LEFT lower lobe atelectasis or infiltrate. 3. Probable LEFT pleural effusion. Electronically Signed   By: Nolon Nations M.D.   On: 09/14/2019 19:18   DG Chest Port 1 View  Result Date: 08/31/2019 CLINICAL DATA:  Shortness  of breath EXAM: PORTABLE CHEST 1 VIEW COMPARISON:  August 16, 2019 and April 13, 2019 FINDINGS: There is suspected scarring in the left base. There may also be epicardial fat prominence in this area. The appearance is stable. There is no apparent edema or airspace opacity. Heart is upper normal in size with pulmonary vascularity normal. Pacemaker lead tips are attached to the right atrium and right ventricle. No adenopathy. There is aortic atherosclerosis. Bones are osteoporotic. Patient is undergone prior kyphoplasty procedure in the lower thoracic region IMPRESSION: Suspect a degree of scarring in the lateral left base. No edema or airspace opacity. Stable cardiac silhouette. Pacemaker leads attached to right atrium and right ventricle. Bones osteoporotic. Aortic Atherosclerosis (ICD10-I70.0). Electronically Signed   By: Lowella Grip III M.D.   On: 08/31/2019 15:29   IR KYPHO THORACIC WITH BONE BIOPSY  Result Date: 09/06/2019 INDICATION: Mattye Verdone is a 86 year old. Female with a past medical history of hypertension, hyperlipidemia, sick sinus syndrome, HF, paroxysmal atrial fibrillation not on chronic anticoagulation, [place] 2016, CVA 2017, ESRD on HD, nephrolithiasis, osteopenia, and multiple fractures (thoracic compression fractures, right hip fracture, metatarsal fracture). She presented to [facility] ED 09/01/2019 with complaints of back and abdominal pain. In ED, CT abdomen/pelvis revealed diverticulitis and a new T11 compression fracture. EXAM: Fluoroscopy guided T11 kyphoplasty and biopsy COMPARISON:  MRI of the thoracic spine July 24, 2018; CT of the abdomen August 31, 2019 MEDICATIONS: As antibiotic prophylaxis, Ancef 2 gm IV was ordered pre-procedure and administered intravenously within 1 hour of incision. ANESTHESIA/SEDATION: Moderate (conscious) sedation was employed during this procedure. A total of Versed 1.5 mg and Fentanyl 50 mcg was administered intravenously. Moderate Sedation Time: 45  minutes. The patient's level of consciousness and vital signs were monitored continuously by radiology nursing throughout the procedure under my direct supervision. FLUOROSCOPY TIME:  Fluoroscopy Time: 12 minutes 6 seconds (546 mGy) COMPLICATIONS: None immediate. PROCEDURE: Following a full explanation of the procedure along with the potential associated complications, an informed witnessed consent was obtained. The patient was placed in prone position on the angiography table. The thoracic spine region was prepped and draped in a sterile fashion. Under fluoroscopy, the the T11 vertebral body was delineated and the skin area was marked. The skin was infiltrated with a 1% Lidocaine approximately 3.5 cm lateral to the spinous process projection on the right. Using a 22-gauge spinal needle, the soft issue and the peripedicular space and periosteum were infiltrated with Bupivacaine 0.25%. A skin incision was made at the access site. Subsequently, an 11-gauge Kyphon trocar was inserted under fluoroscopic guidance until contact with the pedicle was obtained. The trocar was advanced through the pedicle into the vertebral body with light hammer tapping. The diamond mandrill was removed and one core biopsy was obtained. The skin was infiltrated with a 1% Lidocaine approximately 3.5 cm lateral to the spinous process projection on the left. Using a 22-gauge spinal needle, the soft issue and the peripedicular space and periosteum were infiltrated with Bupivacaine  0.25%. A skin incision was made at the access site. Subsequently, an 11-gauge Kyphon trocar was inserted under fluoroscopic guidance until contact with the pedicle was obtained. The trocar was advanced through the pedicle into the vertebral body with light hammer tapping. The diamond mandrill was removed. A bone drill was coaxially advanced within the anterior third of the vertebral body and then exchanged for inflatable Kyphon balloons. These were centered within the  mid-aspect of the vertebral body. The balloons were inflated to create a void to serve as a repository for the bone cement. Both balloons were deflated and through both cannulas, under continuous fluoroscopy guidance in the AP and lateral views. The vertebral body was filled with previously mixed polymethyl-methacrylate (PMMA) added to barium for opacification. Both cannulas were later removed. The access sites were cleaned and covered with a sterile bandage. IMPRESSION: Successful fluoroscopy-guided bilateral transpedicular approach for T11 vertebral body core bone biopsy and kyphoplasty for treatment of osteoporotic fragility fracture. Bone sample obtained was sent for pathology analysis. Electronically Signed   By: Pedro Earls M.D.   On: 09/06/2019 16:03   CT Maxillofacial Wo Contrast  Result Date: 09/09/2019 CLINICAL DATA:  [place] from bed EXAM: CT MAXILLOFACIAL WITHOUT CONTRAST TECHNIQUE: Multidetector CT imaging of the maxillofacial structures was performed. Multiplanar CT image reconstructions were also generated. COMPARISON:  None. FINDINGS: Osseous: No fracture or mandibular dislocation. No destructive process. Orbits: Negative. No traumatic or inflammatory finding. Sinuses: Clear. Soft tissues: There is mucosal thickening within the left nasal passage. Remaining soft tissues are unremarkable. Limited intracranial: No significant or unexpected finding. IMPRESSION: 1. Mucosal thickening left nasal passage. 2. No acute displaced fracture. Electronically Signed   By: Randa Ngo M.D.   On: 09/09/2019 21:46     Phillips Climes M.D on 09/24/2019 at 1:36 PM  Between 7am to 7pm - Pager - [phone]  After 7pm go to [email or web address] - password [facility]  [facility] -  Office  [phone]

## 2019-09-24 NOTE — Plan of Care (Signed)
  Problem: Education: Goal: Knowledge of General Education information will improve Description: Including pain rating scale, medication(s)/side effects and non-pharmacologic comfort measures Outcome: Not Progressing   Problem: Health Behavior/Discharge Planning: Goal: Ability to manage health-related needs will improve Outcome: Not Progressing   

## 2019-09-24 NOTE — Progress Notes (Signed)
SATURATION QUALIFICATIONS: (This note is used to comply with regulatory documentation for home oxygen)  Patient Saturations on Room Air at Rest = 87%  Patient Saturations on Room Air while Ambulating = N/A  Patient Saturations on  Liters of oxygen while Ambulating = N/A  Please briefly explain why patient needs home oxygen: Pt needs home O2 in order to maintain O2 sats above 88%. Pt desatted below 88% on RA at rest and can not tolerate walking.

## 2019-09-24 NOTE — Care Management Obs Status (Signed)
[place] NOTIFICATION   Patient Details  Name: Valerie Terrell MRN: [id number] Date of Birth: 1932-11-11   Medicare Observation Status Notification Given:  Yes    Carles Collet, RN 09/24/2019, 4:16 PM

## 2019-09-24 NOTE — Evaluation (Signed)
Physical Therapy Evaluation Patient Details Name: Valerie Terrell MRN: [id number] DOB: [birth year] Today's Date: 09/24/2019   History of Present Illness  Valerie Terrell is a 86 y.o. female with medical history significant of chronic back pain; CVA; pacemaker placement; afib, not on AC due to falls; HTN; HLD; ESRD on TTS HD; and chronic combined CHF presenting with L-sided CP.  She has had 4 ER visits and 4 admissions in the last 6 months.  Her most recent admission was 4/29-5/5 for LLL CAP, treated with Rocephin.  She was discharged late Wednesday and went to [place] for short-term rehab.  Her daughter went to check on her this morning - sleeping and woke up with terrible chest pain down her left arm, not sleeping well, "everything hurt." Principle problem includes: Failure to thrive.   Clinical Impression  Pt continues to require assist with all mobility. Pt and family do not want pt to return to a SNF and plan for pt to come home with daughter assisting. Recommend HHPT at DC.     Follow Up Recommendations Home health PT;Supervision/Assistance - 24 hour(pt refusing return to SNF)    Equipment Recommendations  None recommended by PT    Recommendations for Other Services       Precautions / Restrictions Precautions Precautions: Fall;Other (comment) Precaution Comments: Monitor SpO2. T12 compression fx last admission. Restrictions Weight Bearing Restrictions: No      Mobility  Bed Mobility Overal bed mobility: Needs Assistance Bed Mobility: Supine to Sit   Sidelying to sit: Min assist;HOB elevated       General bed mobility comments: Assist to elevate trunk into sitting and bring hips to EOB  Transfers Overall transfer level: Needs assistance Equipment used: Rolling walker (2 wheeled) Transfers: Sit to/from [facility] Sit to Stand: Min assist;+2 physical assistance Stand pivot transfers: Min assist;+2 physical assistance       General transfer  comment: Assist to bring hips up and for balance. Extremely kyphotic. Small pivotal shuffling steps for bed to recliner with walker.   Ambulation/Gait             General Gait Details: Limited bed to chair transfer  Stairs            Wheelchair Mobility    Modified Rankin (Stroke Patients Only)       Balance Overall balance assessment: Needs assistance Sitting-balance support: Feet supported Sitting balance-Leahy Scale: (poor to fair) Sitting balance - Comments: Pt tolerated sitting EOB ~8 min with variable supervision to min assist. Occasional left lateral LOB with pt requiring assist to self-correct.  Postural control: Left lateral lean Standing balance support: Bilateral upper extremity supported;During functional activity Standing balance-Leahy Scale: Poor Standing balance comment: walker and +2 min assist for static standing                             Pertinent Vitals/Pain Pain Assessment: No/denies pain    Home Living Family/patient expects to be discharged to:: Private residence Living Arrangements: Children Available Help at Discharge: Family;Available 24 hours/day Type of Home: House Home Access: Ramped entrance     Home Layout: One level Home Equipment: Bedside commode;Walker - 2 wheels;Cane - single point;Walker - 4 wheels;Tub bench;Wheelchair - manual;Transport chair;Hospital bed Additional Comments: Unsure of accuracy of PLOF as pt is confused, A&O x 1. PLOF obtained from previous admission.     Prior Function Level of Independence: Needs assistance   Gait / Transfers Assistance  Needed: During recent admission pt amb 5' with mod assist  ADL's / Homemaking Assistance Needed: Pt has been requiring assist with all ADLs since last admission in April         Hand Dominance   Dominant Hand: Right    Extremity/Trunk Assessment   Upper Extremity Assessment Upper Extremity Assessment: Defer to OT evaluation    Lower Extremity  Assessment Lower Extremity Assessment: Generalized weakness    Cervical / Trunk Assessment Cervical / Trunk Assessment: Kyphotic  Communication   Communication: HOH  Cognition Arousal/Alertness: Awake/alert Behavior During Therapy: WFL for tasks assessed/performed Overall Cognitive Status: Impaired/Different from baseline Area of Impairment: Orientation;Memory;Following commands                 Orientation Level: Disoriented to;Place;Time;Situation   Memory: Decreased short-term memory Following Commands: Follows one step commands consistently;Follows one step commands with increased time     Problem Solving: Slow processing;Decreased initiation;Requires verbal cues;Requires tactile cues General Comments: Pt pleasant and willing to participate in therapy. Requires increased time to complete tasks. Pt appears confused stating that she's at [facility]. Multimodal cues to initiate bed mobility tasks.       General Comments General comments (skin integrity, edema, etc.): Pt on 3L [place] with SpO2 98%. SpO2 dropped to 81% on room air with pt requiring ~1 min seated recovery for O2 to increase back into 90s on 3L [place]. Pt reported min SOB throughout.     Exercises Other Exercises Other Exercises: Instructed pt on trunk rotation and trunk flexion while sitting in recliner.   Assessment/Plan    PT Assessment Patient needs continued PT services  PT Problem List Decreased strength;Decreased activity tolerance;Decreased balance;Decreased mobility       PT Treatment Interventions DME instruction;Gait training;Functional mobility training;Therapeutic activities;Therapeutic exercise;Balance training;Patient/family education    PT Goals (Current goals can be found in the Care Plan section)  Acute Rehab PT Goals Patient Stated Goal: to get a stronger trunk PT Goal Formulation: With patient Time For Goal Achievement: 10/08/19 Potential to Achieve Goals: Fair    Frequency Min 3X/week    Barriers to discharge Other (comment) HD 4x/wk    Co-evaluation PT/OT/SLP Co-Evaluation/Treatment: Yes Reason for Co-Treatment: For patient/therapist safety PT goals addressed during session: Mobility/safety with mobility OT goals addressed during session: ADL's and self-care;Strengthening/ROM       AM-PAC PT "6 Clicks" Mobility  Outcome Measure Help needed turning from your back to your side while in a flat bed without using bedrails?: A Little Help needed moving from lying on your back to sitting on the side of a flat bed without using bedrails?: A Little Help needed moving to and from a bed to a chair (including a wheelchair)?: A Lot Help needed standing up from a chair using your arms (e.g., wheelchair or bedside chair)?: A Lot Help needed to walk in hospital room?: Total Help needed climbing 3-5 steps with a railing? : Total 6 Click Score: 12    End of Session Equipment Utilized During Treatment: Gait belt;Oxygen Activity Tolerance: Patient limited by fatigue Patient left: in chair;with call bell/phone within reach;with chair alarm set Nurse Communication: Mobility status PT Visit Diagnosis: Unsteadiness on feet (R26.81);Muscle weakness (generalized) (M62.81);Other abnormalities of gait and mobility (R26.89)    Time: 5625-6389 PT Time Calculation (min) (ACUTE ONLY): 31 min   Charges:   PT Evaluation $PT Eval Moderate Complexity: [place] Pager [phone] Office [phone]  Shary Decamp [facility] 09/24/2019, 4:14 PM

## 2019-09-25 ENCOUNTER — Encounter (HOSPITAL_COMMUNITY): Payer: Self-pay | Admitting: Internal Medicine

## 2019-09-25 DIAGNOSIS — R079 Chest pain, unspecified: Secondary | ICD-10-CM | POA: Diagnosis not present

## 2019-09-25 DIAGNOSIS — R627 Adult failure to thrive: Secondary | ICD-10-CM | POA: Diagnosis not present

## 2019-09-25 DIAGNOSIS — Z992 Dependence on renal dialysis: Secondary | ICD-10-CM | POA: Diagnosis not present

## 2019-09-25 DIAGNOSIS — N186 End stage renal disease: Secondary | ICD-10-CM | POA: Diagnosis not present

## 2019-09-25 DIAGNOSIS — I5042 Chronic combined systolic (congestive) and diastolic (congestive) heart failure: Secondary | ICD-10-CM | POA: Diagnosis not present

## 2019-09-25 DIAGNOSIS — I509 Heart failure, unspecified: Secondary | ICD-10-CM | POA: Diagnosis not present

## 2019-09-25 DIAGNOSIS — I480 Paroxysmal atrial fibrillation: Secondary | ICD-10-CM | POA: Diagnosis not present

## 2019-09-25 DIAGNOSIS — I132 Hypertensive heart and chronic kidney disease with heart failure and with stage 5 chronic kidney disease, or end stage renal disease: Secondary | ICD-10-CM | POA: Diagnosis not present

## 2019-09-25 LAB — CBC
HCT: 29.9 % — ABNORMAL LOW (ref 36.0–46.0)
Hemoglobin: 9.2 g/dL — ABNORMAL LOW (ref 12.0–15.0)
MCH: 30.7 pg (ref 26.0–34.0)
MCHC: 30.8 g/dL (ref 30.0–36.0)
MCV: 99.7 fL (ref 80.0–100.0)
Platelets: 233 10*3/uL (ref 150–400)
RBC: 3 MIL/uL — ABNORMAL LOW (ref 3.87–5.11)
RDW: 17.2 % — ABNORMAL HIGH (ref 11.5–15.5)
WBC: 13.5 10*3/uL — ABNORMAL HIGH (ref 4.0–10.5)
nRBC: 0 % (ref 0.0–0.2)

## 2019-09-25 LAB — RENAL FUNCTION PANEL
Albumin: 1.9 g/dL — ABNORMAL LOW (ref 3.5–5.0)
Anion gap: 11 (ref 5–15)
BUN: 17 mg/dL (ref 8–23)
CO2: 28 mmol/L (ref 22–32)
Calcium: 8.3 mg/dL — ABNORMAL LOW (ref 8.9–10.3)
Chloride: 98 mmol/L (ref 98–111)
Creatinine, Ser: 3.83 mg/dL — ABNORMAL HIGH (ref 0.44–1.00)
GFR calc Af Amer: 12 mL/min — ABNORMAL LOW (ref >60)
GFR calc non Af Amer: 10 mL/min — ABNORMAL LOW (ref >60)
Glucose, Bld: 96 mg/dL (ref 70–99)
Phosphorus: 3.2 mg/dL (ref 2.5–4.6)
Potassium: 4.2 mmol/L (ref 3.5–5.1)
Sodium: 137 mmol/L (ref 135–145)

## 2019-09-25 MED ORDER — DARBEPOETIN ALFA 40 MCG/0.4ML IJ SOSY: 40.0000 ug | PREFILLED_SYRINGE | INTRAMUSCULAR | Status: DC

## 2019-09-25 MED ORDER — CHLORHEXIDINE GLUCONATE CLOTH 2 % EX PADS: 6.0000 | MEDICATED_PAD | Freq: Every day | CUTANEOUS | Status: DC

## 2019-09-25 MED ORDER — SEVELAMER CARBONATE 800 MG PO TABS
800.0000 mg | ORAL_TABLET | Freq: Three times a day (TID) | ORAL | Status: DC
  Administered 2019-09-25: 800 mg via ORAL

## 2019-09-25 MED ORDER — PRO-STAT SUGAR FREE PO LIQD: 30.0000 mL | Freq: Two times a day (BID) | ORAL | Status: DC

## 2019-09-25 MED ORDER — RENA-VITE PO TABS: 1.0000 | ORAL_TABLET | Freq: Every day | ORAL | Status: DC

## 2019-09-25 MED ORDER — PRO-STAT SUGAR FREE PO LIQD: 30.0000 mL | Freq: Two times a day (BID) | ORAL | 0 refills | Status: AC

## 2019-09-25 MED ORDER — HEPARIN SODIUM (PORCINE) 1000 UNIT/ML IJ SOLN
INTRAMUSCULAR | Status: AC
  Administered 2019-09-25: 1400 [IU]
  Filled 2019-09-25: qty 2

## 2019-09-25 MED ORDER — CALCIUM CARBONATE ANTACID 1250 MG/5ML PO SUSP: 500.0000 mg | Freq: Four times a day (QID) | ORAL | 0 refills | Status: AC | PRN

## 2019-09-25 MED ORDER — SEVELAMER CARBONATE 800 MG PO TABS: 1600.0000 mg | ORAL_TABLET | Freq: Three times a day (TID) | ORAL | Status: AC

## 2019-09-25 MED ORDER — NEPRO/CARBSTEADY PO LIQD: 237.0000 mL | Freq: Three times a day (TID) | ORAL | 0 refills | Status: AC | PRN

## 2019-09-25 NOTE — Discharge Instructions (Signed)
Follow with Primary MD McGowen, Adrian Blackwater, MD   Activity: As tolerated with Full fall precautions use walker/cane & assistance as needed   Disposition Home    Diet: Renal Diet, with feeding assistance and aspiration precautions.  For Heart failure patients - Check your Weight same time everyday, if you gain over 2 pounds, or you develop in leg swelling, experience more shortness of breath or chest pain, call your Primary MD immediately. Follow Cardiac Low Salt Diet and 1.5 lit/day fluid restriction.   On your next visit with your primary care physician please Get Medicines reviewed and adjusted.   Please request your Prim.MD to go over all Hospital Tests and Procedure/Radiological results at the follow up, please get all Hospital records sent to your Prim MD by signing hospital release before you go home.   If you experience worsening of your admission symptoms, develop shortness of breath, life threatening emergency, suicidal or homicidal thoughts you must seek medical attention immediately by calling 911 or calling your MD immediately  if symptoms less severe.  You Must read complete instructions/literature along with all the possible adverse reactions/side effects for all the Medicines you take and that have been prescribed to you. Take any new Medicines after you have completely understood and accpet all the possible adverse reactions/side effects.   Do not drive, operating heavy machinery, perform activities at heights, swimming or participation in water activities or provide baby sitting services if your were admitted for syncope or siezures until you have seen by Primary MD or a Neurologist and advised to do so again.  Do not drive when taking Pain medications.    Do not take more than prescribed Pain, Sleep and Anxiety Medications  Special Instructions: If you have smoked or chewed Tobacco  in the last 2 yrs please stop smoking, stop any regular Alcohol  and or any Recreational  drug use.  Wear Seat belts while driving.   Please note  You were cared for by a hospitalist during your hospital stay. If you have any questions about your discharge medications or the care you received while you were in the hospital after you are discharged, you can call the unit and asked to speak with the hospitalist on call if the hospitalist that took care of you is not available. Once you are discharged, your primary care physician will handle any further medical issues. Please note that NO REFILLS for any discharge medications will be authorized once you are discharged, as it is imperative that you return to your primary care physician (or establish a relationship with a primary care physician if you do not have one) for your aftercare needs so that they can reassess your need for medications and monitor your lab values.

## 2019-09-25 NOTE — Progress Notes (Signed)
Patient off unit in HD.

## 2019-09-25 NOTE — Telephone Encounter (Signed)
Spoke with patients daughter, [name].  Patient currently admitted with plans of discharge today per daughter.  [name] states plan is to discharge home. Advised to have hospitalist order and arrange oxygen and home needs.  Verbalized understanding.

## 2019-09-25 NOTE — Discharge Summary (Signed)
Valerie Terrell, is a 86 y.o. female  DOB 1933-03-05  MRN [id number].  Admission date:  09/23/2019  Admitting Physician  Karmen Bongo, MD  Discharge Date:  09/25/2019   Primary MD  Tammi Sou, MD  Recommendations for primary care physician for things to follow:  -Patient to continue hemodialysis on M TTS schedule, and further management per palliative [facility](she is palliative, and NOT hospice)   Admission Diagnosis  Pleural effusion [J90] Failure to thrive in adult [R62.7] Chest pain, unspecified type [R07.9]   Discharge Diagnosis  Pleural effusion [J90] Failure to thrive in adult [R62.7] Chest pain, unspecified type [R07.9]    Principal Problem:   Failure to thrive in adult Active Problems:   PAF (paroxysmal atrial fibrillation) (HCC)   Left-sided chest pain   Dyslipidemia   Chronic combined systolic and diastolic heart failure ([place])   ESRD on dialysis ([place])   Chronic back pain      Past Medical History:  Diagnosis Date  . Anemia of chronic disease 2018   + anemia of CRI?  [name] Atrophy of left kidney    with absent blood flow by renal artery dopplers (Dr. Gwenlyn Found)  . 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 Chronic combined systolic and diastolic CHF (congestive heart failure) ([place])    a. 11/2016: echo showing EF of 35-40%, RV strain noted, mild MR and mild TR.   Echo showed much improved EF 12/2017 (55-60%)  . Chronic respiratory failure with hypoxia ([place]) 02/11/2017   Now on chronic O2 (intermittent as of summer 2019).    . Closed fracture of right superior pubic ramus ([place]) 07/18/2018   Non op mgmt (Dr. Victorino December was ortho who saw her in hosp but i don't think pt followed up with him).  . Closed right hip fracture ([place]) 10/04/2018   ORIF  . Debilitated patient    WC dependent as of 2018.  Hosp bed + full assistance with most ADLs s/p hip fx surg 09/2018.    [name] ESRD on hemodialysis ([place]) 01/2017   T/Th/Sat schedule- [place].  Right basilic AV fistula [id number]/7858.  Graft 2019.  [name] History of adenomatous polyp of colon   . History of kidney stones   . History of subarachnoid hemorrhage 10/2014   after syncope and while on xarelto  . Hyperlipidemia   . Hypertension    Difficult to control, in the setting of one functioning kidney: pt was referred to nephrology by Dr. Gwenlyn Found 06/2015.  . Lumbar radiculopathy 2012  . Lumbar spinal stenosis 2019   facet injections only very short term relief.  L1 and L2 selective nerve root blocks helpful 04/2018.  . Lumbar spondylosis    MR 07/2016---no sign of spinal nerve compression or cord compression.  Pt set up with outpt ortho while admitted to hosp 07/2016.  . Malnourished ([place])   . Metatarsal fracture 06/10/2016   Nondisplaced, left 5th metatarsal--pt was referred to ortho  . Osteopenia 2014   T-score -2.1  . PAF (  paroxysmal atrial fibrillation) (HCC)    Eliquis started after BRAO and CVA.  She was changed to warfarin 2018. All anticoag stopped due to recurrent falls.  . Presence of permanent cardiac pacemaker   . Right rib fracture 12/2016   s/p fall  . Sick sinus syndrome ([place]) 11/2016   Dual chamber pacer insertion 2018 (Dr. Lovena Le)  . Stroke ([place]) 99/8338   cardioembolic (had CVA while on no anticoag)--"scattered subacute punctate infarcts: 1 in R parietal lobe and 2 in occipital cortex" on MRI br.  CT angio head/neck: aortic arch athero.  R ICA 20% stenosis, L ICA w/out any stenosis.  . Thoracic back pain 01/2017; 01/2018   2018: Facet?  Dr. Ramos->steroid injection.  01/2018-->CT T spine to check for a new comp fx-->none found.  Selective nerve root inj in T spine helpful on R side.   . Thoracic compression fracture ([place]) 07/2016   T3.  T7 and T8-- T8 kyphoplasty during hosp admission 07/2016.  Neuro referred pt to pain mgmt for consideration of injection 09/2016.  I referred her to endo 08/2016 for  consideration of calcitonin treatment.    Past Surgical History:  Procedure Laterality Date  . APPENDECTOMY  child  . AV FISTULA PLACEMENT Right 03/31/2017   Procedure: Right ARM Basilic vein transposition;  Surgeon: Conrad [place], MD;  Location: [facility] OR;  Service: Vascular;  Laterality: Right;  . AV FISTULA PLACEMENT Right 12/06/2017   Procedure: INSERTION OF ARTERIOVENOUS (AV) GORE-TEX GRAFT ARM RIGHT UPPER EXTREMITY;  Surgeon: [name], MD;  Location: [facility];  Service: Vascular;  Laterality: Right;  . CARDIOVASCULAR STRESS TEST  01/31/2016   Stress myoview: NORMAL/Low risk.  EF 56%.  . [place]  . COLONOSCOPY  2015   + hx of adenomatous polyps.  Need digest health spec in [place] records to see when pt due for next colonoscopy  . DECLOT CKV Right 03/21/2019  . EYE SURGERY Bilateral    Catarct  . INSERTION OF DIALYSIS CATHETER Right 01/29/2017   Procedure: INSERTION OF DIALYSIS CATHETER- RIGHT INTERNAL JUGULAR;  Surgeon: [name], MD;  Location: [facility];  Service: Vascular;  Laterality: Right;  . INTRAMEDULLARY (IM) NAIL INTERTROCHANTERIC Right 10/04/2018   Procedure: INTRAMEDULLARY (IM) NAIL INTERTROCHANTRIC;  Surgeon: Nicholes Stairs, MD;  Location: [facility];  Service: Orthopedics;  Laterality: Right;  . IR GENERIC HISTORICAL  07/24/2016   IR KYPHO THORACIC WITH BONE BIOPSY 07/24/2016 Luanne Bras, MD [facility]-INTER[facility] RAD  . IR KYPHO THORACIC WITH BONE BIOPSY  09/06/2019  . KYPHOPLASTY  07/27/2016   T8  . PACEMAKER IMPLANT N/A 12/03/2016   Procedure: Pacemaker Implant;  Surgeon: Evans Lance, MD;  Location: [facility];  Service: Cardiovascular;  Laterality: N/A;  . Renal artery dopplers  02/26/2016   Her right renal dimension was 11 cm pole to pole with mild to moderate right renal artery stenosis. A right renal aortic ratio was 3.22 suggesting less than a 50% stenosis.  . Right upper arm AV Gore-Tex graft  12/06/2017   Dr. Donnetta Hutching  . TONSILLECTOMY    .  TRANSTHORACIC ECHOCARDIOGRAM  01/29/2016; 11/2016; 12/2017   2017: EF 55-60%, normal LV wall motion, grade I DD.  No cardiac source of emboli was seen.  2018: EF 35-40%, could not assess DD due to a-fib, pulm HTN noted. 12/2017 EF 55-60%, nl wall motion, grd II DD, mod MR and pulm regurg, increased pulm pressure (peak 52).  History of present illness and  Hospital Course:     Kindly see H&P for history of present illness and admission details, please review complete Labs, Consult reports and Test reports for all details in brief  HPI  from the history and physical done on the day of admission 09/23/2019  HPI: Valerie Terrell is a 86 y.o. female with medical history significant of chronic back pain; CVA; pacemaker placement; afib, not on AC due to falls; HTN; HLD; ESRD on TTS HD; and chronic combined CHF presenting with L-sided CP.  She has had 4 ER visits and 4 admissions in the last 6 months.  Her most recent admission was 4/29-5/5 for LLL CAP, treated with Rocephin.  She was discharged late Wednesday and went to [facility] for short-term rehab.  Her daughter went to check on her this morning - sleeping and woke up with terrible chest pain down her left arm, not sleeping well, "everything hurt."  No cough or SOB.  She has been on O2 since the last hospitalization, still 2.5 L since last hospitalization.    CP started in the left rib area and radiated down the left arm.  The pain has resolved after oxycodone, fentanyl.  Thursday, she had HD and she "was pitiful after dialysis" - weak and out of it more than normal.  She was talking with palliative care NP.  Yesterday was a bit better, able to do a little PT/OT.   She was able to get OOB to bathroom.     ED Course:  Discharged recently to [facility].  C/o L chest pain, moaning, diffuse discomfort.  Calmer now.  CXR with ?worsening effusion/consolidation with elevated WBC count.    Hospital Course   Failure to thrive/severe  debility -Patient with multiple recent hospitalizations and ER visits. -She is enrolled with outpatient palliative care as well as having palliative care consultations while here in the hospital -She was previously living at home with her daughter as her caregiver but was discharged to SNF rehab on 5/5. -This has failed and her daughter and the patient prefer that she return home -Patient with significant multiple comorbidities, and extremely frail at baseline, and she is on hemodialysis 4 days a week, she requires significant amount of care, palliative medicine consulted, goals of care is to treat with this treatable currently, I have discussed with daughter at length today, and they do not want to go back to [facility], and they would like to go home, resumed her palliative care through [facility], and home health has been arranged. -Has a significant amount of dyspnea secondary to her chronic left pleural effusion, and she will need oxygen on discharge, oxygen has been arranged for home.  Left-sided CP -Patient complained this AM of left-sided CP with radiation along the left arm -ACS ruled out, EKG nonacute, negative troponins x2, this is most likely musculoskeletal, may be related to her chronic left pleural effusion. -Admitting physician D/w pulmonology to see if thoracentesis is reasonable - unlikely large enough for drainage per PCCM, looks like atelectasis  -Continue [place] O2  ESRD onMTTSHD patient -Patient has chronic pain and so difficulty sitting in the chair and also recurrent hypotension -She is now doing HD 4 days a week -Surgery were consulted and dialysis were resumed during hospital stay, phosphate binder were lowered on discharge, and she was started on calcium supplement as well  Chronic back pain -Patient by palliative medicine  Afib, not on [facility] -Has pacemaker -Rate control with amiodarone -  not on AC due to falls  H/o CVA -Continue ASA, Imdur  HTN -No longer  taking medications for this issue  HLD -Continue Pravachol  Chronic combined CHF -11/2017 echo with preserved EF and grade 2 diastolic dysfunction -Volume control with HD      Discharge Condition:  Stable  -But overall she is extremely frail, deconditioned with multiple comorbidities, at high risk for decompensation and multiple readmissions.     Discharge Instructions  and  Discharge Medications    Discharge Instructions    Discharge instructions   Complete by: As directed    Follow with Primary MD McGowen, Adrian Blackwater, MD   Activity: As tolerated with Full fall precautions use walker/cane & assistance as needed   Disposition Home    Diet: Renal Diet, with feeding assistance and aspiration precautions.  For Heart failure patients - Check your Weight same time everyday, if you gain over 2 pounds, or you develop in leg swelling, experience more shortness of breath or chest pain, call your Primary MD immediately. Follow Cardiac Low Salt Diet and 1.5 lit/day fluid restriction.   On your next visit with your primary care physician please Get Medicines reviewed and adjusted.   Please request your Prim.MD to go over all Hospital Tests and Procedure/Radiological results at the follow up, please get all Hospital records sent to your Prim MD by signing hospital release before you go home.   If you experience worsening of your admission symptoms, develop shortness of breath, life threatening emergency, suicidal or homicidal thoughts you must seek medical attention immediately by calling 911 or calling your MD immediately  if symptoms less severe.  You Must read complete instructions/literature along with all the possible adverse reactions/side effects for all the Medicines you take and that have been prescribed to you. Take any new Medicines after you have completely understood and accpet all the possible adverse reactions/side effects.   Do not drive, operating heavy  machinery, perform activities at heights, swimming or participation in water activities or provide baby sitting services if your were admitted for syncope or siezures until you have seen by Primary MD or a Neurologist and advised to do so again.  Do not drive when taking Pain medications.    Do not take more than prescribed Pain, Sleep and Anxiety Medications  Special Instructions: If you have smoked or chewed Tobacco  in the last 2 yrs please stop smoking, stop any regular Alcohol  and or any Recreational drug use.  Wear Seat belts while driving.   Please note  You were cared for by a hospitalist during your hospital stay. If you have any questions about your discharge medications or the care you received while you were in the hospital after you are discharged, you can call the unit and asked to speak with the hospitalist on call if the hospitalist that took care of you is not available. Once you are discharged, your primary care physician will handle any further medical issues. Please note that NO REFILLS for any discharge medications will be authorized once you are discharged, as it is imperative that you return to your primary care physician (or establish a relationship with a primary care physician if you do not have one) for your aftercare needs so that they can reassess your need for medications and monitor your lab values.   Increase activity slowly   Complete by: As directed      Allergies as of 09/25/2019      Reactions  Clonidine Derivatives Palpitations, Other (See Comments)   VERY SEDATED   Adhesive [tape] Other (See Comments)   Tears skin up   Codeine Nausea Only   Nickel Rash   Sulfa Antibiotics Other (See Comments)   Did not feel well when taken      Medication List    TAKE these medications   acetaminophen 500 MG tablet Commonly known as: TYLENOL Take 1,000 mg by mouth 3 (three) times daily as needed for moderate pain.   amiodarone 200 MG tablet Commonly known  as: PACERONE TAKE 1 TABLET BY MOUTH  DAILY   aspirin EC 325 MG tablet Take 325 mg by mouth at bedtime.   B Complex-C-Folic Acid Tabs Take 1 tablet by mouth See admin instructions. Take one tablet by mouth Tuesday, Thursday, Saturday after dialysis   calcitonin (salmon) 200 UNIT/ACT nasal spray Commonly known as: MIACALCIN/FORTICAL Place 1 spray into alternate nostrils daily.   calcium carbonate (dosed in mg elemental calcium) 1250 MG/5ML Susp Take 5 mLs (500 mg of elemental calcium total) by mouth every 6 (six) hours as needed for indigestion.   cholecalciferol 25 MCG (1000 UNIT) tablet Commonly known as: VITAMIN D3 Take 5,000 Units by mouth at bedtime.   Denta 5000 Plus 1.1 % Crea dental cream Generic drug: sodium fluoride Place 1 application onto teeth at bedtime.   docusate sodium 100 MG capsule Commonly known as: COLACE Take 100 mg by mouth daily as needed for mild constipation.   doxercalciferol 2.5 MCG capsule Commonly known as: HECTOROL Take 2.5 mcg by mouth See admin instructions. Take one capsule (2.5 mcg) by mouth on Tuesday, Thursday, Saturday after dialysis   feeding supplement (NEPRO CARB STEADY) Liqd Take 237 mLs by mouth 3 (three) times daily as needed (Supplement).   feeding supplement (PRO-STAT SUGAR FREE 64) Liqd Take 30 mLs by mouth 2 (two) times daily.   guaiFENesin 600 MG 12 hr tablet Commonly known as: MUCINEX Take 600 mg by mouth daily.   hydrocortisone cream 1 % Apply topically 4 (four) times daily. What changed:   how much to take  when to take this  reasons to take this   isosorbide mononitrate 60 MG 24 hr tablet Commonly known as: IMDUR TAKE 1 TABLET BY MOUTH  DAILY ; TAKE 1 TABLET ON  SUN, MON, WED, FRI MORNINGS AND 1 TABLET ON TUES, THU,  SAT AFTER DIALYSIS What changed: See the new instructions.   Lidocaine 4 % Ptch Apply 1 patch topically daily as needed (Mild pain). Remove patch in 12 hours   lidocaine-prilocaine  cream Commonly known as: EMLA Apply 1 application topically See admin instructions. Apply small amount to access site (AVF) one hour before dialysis (Tuesday, Thursday, Saturday), cover with occlusive dressing (saran wrap)   ondansetron 4 MG tablet Commonly known as: ZOFRAN Take 1 tablet (4 mg total) by mouth every 8 (eight) hours as needed for nausea or vomiting.   polyethylene glycol 17 g packet Commonly known as: MIRALAX / GLYCOLAX Take 17 g by mouth daily as needed for mild constipation.   polyvinyl alcohol 1.4 % ophthalmic solution Commonly known as: LIQUIFILM TEARS Place 2 drops into both eyes daily as needed for dry eyes.   pravastatin 40 MG tablet Commonly known as: PRAVACHOL TAKE 1 TABLET BY MOUTH  EVERY EVENING What changed: when to take this   sevelamer carbonate 800 MG tablet Commonly known as: RENVELA Take 2 tablets (1,600 mg total) by mouth 3 (three) times daily with meals.  vitamin C 500 MG tablet Commonly known as: ASCORBIC ACID Take 500 mg by mouth daily.   Vitamin D (Ergocalciferol) 1.25 MG (50000 UNIT) Caps capsule Commonly known as: DRISDOL Take 1 capsule (50,000 Units total) by mouth every Thursday. What changed: additional instructions   zinc sulfate 220 (50 Zn) MG capsule Take 220 mg by mouth daily.            Durable Medical Equipment  (From admission, onward)         Start     Ordered   09/24/19 1637  For home use only DME oxygen  Once    Comments: Patient will need a lightweight portable oxygen concentrator for 4 times a week hemodialysis and medical appointments  Question Answer Comment  Length of Need 6 Months   Mode or (Route) Nasal cannula   Liters per Minute 2   Frequency Continuous (stationary and portable oxygen unit needed)   Oxygen conserving device Yes   Oxygen delivery system Gas      09/24/19 1638            Diet and Activity recommendation: See Discharge Instructions above   Consults obtained -   Palliative Renal   Major procedures and Radiology Reports - PLEASE review detailed and final reports for all details, in brief -      CT Abdomen Pelvis Wo Contrast  Result Date: 09/23/2019 CLINICAL DATA:  Left chest pain and left shoulder pain. History of recently treated pneumonia. Patient on dialysis. EXAM: CT ABDOMEN AND PELVIS WITHOUT CONTRAST TECHNIQUE: Multidetector CT imaging of the abdomen and pelvis was performed following the standard protocol without IV contrast. COMPARISON:  August 31, 2019 FINDINGS: Lower chest: Enlarged heart. Calcific atherosclerotic disease of the coronary arteries and aorta. Bilateral small pleural effusions. Left greater than right lung base airspace consolidation versus atelectasis. Hepatobiliary: No focal liver abnormality is seen. Layering gallbladder sludge/stones without secondary signs of acute cholecystitis. Pancreas: Unremarkable. No pancreatic ductal dilatation or surrounding inflammatory changes. Spleen: Normal in size without focal abnormality. Adrenals/Urinary Tract: Adrenal glands are unremarkable. Atrophic kidneys. Bilateral renal cysts. Stomach/Bowel: Stomach is within normal limits. No evidence of bowel wall thickening, distention, or inflammatory changes. Diffuse colonic diverticulosis without evidence of acute diverticulitis. Vascular/Lymphatic: Severe atherosclerosis and ectasia of the abdominal aorta. No enlarged abdominal or pelvic lymph nodes. Reproductive: Status post hysterectomy. No adnexal masses. Other: No abdominal wall hernia or abnormality. No abdominopelvic ascites. Musculoskeletal: Chronic T12 compression fracture post interval kyphoplasty. Chronic T9 compression fracture post kyphoplasty without significant alignment changes. IMPRESSION: 1. Bilateral small pleural effusions. Left greater than right lung base infectious consolidation versus atelectasis. 2. Cholelithiasis without secondary signs of acute cholecystitis. 3. Atrophic kidneys  consistent with chronic renal insufficiency. 4. Diffuse colonic diverticulosis without evidence of acute diverticulitis. 5. Severe atherosclerosis and ectasia of the abdominal aorta. 6. Post interval kyphoplasty of T12 compression fracture without significant alignment changes. Aortic Atherosclerosis (ICD10-I70.0). Electronically Signed   By: Fidela Salisbury M.D.   On: 09/23/2019 11:47   CT ABDOMEN PELVIS WO CONTRAST  Result Date: 08/31/2019 CLINICAL DATA:  Acute on chronic back pain for several days, emesis, short of breath EXAM: CT ABDOMEN AND PELVIS WITHOUT CONTRAST TECHNIQUE: Multidetector CT imaging of the abdomen and pelvis was performed following the standard protocol without IV contrast. COMPARISON:  12/13/2018 FINDINGS: Lower chest: Hypoventilatory changes are seen at the lung bases. There is chronic scarring at the left lung base. Diffuse atherosclerosis of the aorta and coronary vessels again noted. Extensive  calcification of the aortic and mitral valves. Pacemaker unchanged. Hepatobiliary: Gallbladder sludge is identified without cholecystitis. The liver is unremarkable. Pancreas: Unremarkable. No pancreatic ductal dilatation or surrounding inflammatory changes. Spleen: Normal in size without focal abnormality. Adrenals/Urinary Tract: Progressive bilateral renal cortical atrophy consistent with end-stage renal disease. Stable cysts right kidney. No urinary tract calculi or obstruction. Bladder is unremarkable. The adrenals are unremarkable. Stomach/Bowel: There is circumferential wall thickening of the mid sigmoid colon, reference image 64. Mild pericolonic fat stranding consistent with acute colitis or diverticulitis. No bowel obstruction or ileus. The appendix is surgically absent. Vascular/Lymphatic: There is severe atherosclerosis throughout the aorta and its branches. No pathologic adenopathy. Reproductive: Uterus is markedly atrophic.  No adnexal masses. Other: No free fluid or free gas.  Diffuse atrophy of the abdominal wall musculature unchanged. Musculoskeletal: Chronic T10 compression deformity with previous vertebral augmentation. There is an age-indeterminate T12 compression fracture, new since prior CT. Less than 25% loss of height. No significant paraspinal hematoma. No additional fractures. Postsurgical changes right hip. Reconstructed images demonstrate no additional findings. IMPRESSION: 1. Circumferential wall thickening of the mid sigmoid colon with mild pericolonic fat stranding, consistent with acute uncomplicated diverticulitis or colitis. 2. New T12 compression deformity, with less than 25% loss of height. Based on appearance, this could reflect an acute or subacute fracture. 3. Sequela of end-stage renal disease. 4. Aortic Atherosclerosis (ICD10-I70.0). Severe coronary artery atherosclerosis. Electronically Signed   By: Randa Ngo M.D.   On: 08/31/2019 19:37   DG Thoracic Spine W/Swimmers  Result Date: 09/10/2019 CLINICAL DATA:  Pain after fall.  Recent kyphoplasty. EXAM: THORACIC SPINE - 3 VIEWS COMPARISON:  None. FINDINGS: Patient is status post kyphoplasty at T12 and T9. Anterior wedging of L1, mild, is unchanged since the August 31, 2019 CT scan. Mild anterior wedging of T10 and T11 is also unchanged since the comparison CT scan. Mild anterior wedging of T8 is stable since February 2021. No other fractures are identified. Evaluation of the upper thoracic spine is somewhat limited. IMPRESSION: The patient is status post kyphoplasties at T9 and T12. Anterior wedging at several levels is stable since comparison studies as above. No acute fractures or malalignment identified. Dense calcified atherosclerosis in the abdominal aorta. Electronically Signed   By: Dorise Bullion III M.D   On: 09/10/2019 09:25   DG Lumbar Spine Complete  Result Date: 09/10/2019 CLINICAL DATA:  Recent fall, post kyphoplasty. EXAM: LUMBAR SPINE - COMPLETE 4+ VIEW COMPARISON:  07/04/2019 FINDINGS:  Interval T11 kyphoplasty. Osteopenia. Spinal degenerative changes. No signs of acute fracture. No subluxation. Extensive vascular calcifications project anterior to the abdominal aorta as before. IMPRESSION: Interval T11 kyphoplasty. No signs of acute fracture. Osteopenia and degenerative changes. Electronically Signed   By: Zetta Bills M.D.   On: 09/10/2019 09:26   CT Head Wo Contrast  Result Date: 09/14/2019 CLINICAL DATA:  Altered mental status. EXAM: CT HEAD WITHOUT CONTRAST TECHNIQUE: Contiguous axial images were obtained from the base of the skull through the vertex without intravenous contrast. COMPARISON:  September 09, 2019 FINDINGS: Brain: There is mild cerebral atrophy with widening of the extra-axial spaces and ventricular dilatation. There are areas of decreased attenuation within the white matter tracts of the supratentorial brain, consistent with microvascular disease changes. Small chronic bilateral basal ganglia lacunar infarcts are seen. Vascular: No hyperdense vessel or unexpected calcification. Skull: Normal. Negative for fracture or focal lesion. Sinuses/Orbits: No acute finding. Other: None. IMPRESSION: 1. Generalized cerebral atrophy. 2. No acute intracranial abnormality. Electronically Signed  By: Virgina Norfolk M.D.   On: 09/14/2019 19:23   CT Head Wo Contrast  Result Date: 09/09/2019 CLINICAL DATA:  [place] from bed EXAM: CT HEAD WITHOUT CONTRAST TECHNIQUE: Contiguous axial images were obtained from the base of the skull through the vertex without intravenous contrast. COMPARISON:  07/28/2017 FINDINGS: Brain: Chronic hypodensities are seen throughout the periventricular white matter and bilateral basal ganglia, consistent with chronic small vessel ischemic changes. Stable diffuse cerebral atrophy. No signs of acute infarct or hemorrhage. The lateral ventricles and remaining midline structures are unremarkable. No acute extra-axial fluid collections. No mass effect. Vascular: No  hyperdense vessel or unexpected calcification. Skull: Normal. Negative for fracture or focal lesion. Sinuses/Orbits: No acute finding. Other: None. IMPRESSION: 1. No acute intracranial process. 2. Chronic small vessel ischemic changes and cerebral atrophy. Electronically Signed   By: Randa Ngo M.D.   On: 09/09/2019 21:44   CT Cervical Spine Wo Contrast  Result Date: 09/09/2019 CLINICAL DATA:  [place] from bed, chronic pain EXAM: CT CERVICAL SPINE WITHOUT CONTRAST TECHNIQUE: Multidetector CT imaging of the cervical spine was performed without intravenous contrast. Multiplanar CT image reconstructions were also generated. COMPARISON:  10/04/2018 FINDINGS: Alignment: Alignment is grossly anatomic. Skull base and vertebrae: No acute displaced fracture. Soft tissues and spinal canal: No prevertebral fluid or swelling. No visible canal hematoma. Disc levels: Mild diffuse cervical spondylosis with disc space narrowing and mild circumferential osteophyte formation at all levels. Changes are most pronounced at C5-6 and C6-7. No significant bony encroachment upon the central canal. There is bony fusion of the left C3/C4 facet, with uncovertebral hypertrophy and facet hypertrophy resulting in severe left neural foraminal narrowing. Upper chest: Airway is patent.  Lung apices are clear. Other: Reconstructed images demonstrate no additional findings. IMPRESSION: 1. No acute displaced fracture. 2. Multilevel cervical spondylosis. Electronically Signed   By: Randa Ngo M.D.   On: 09/09/2019 21:50   DG Chest Port 1 View  Result Date: 09/23/2019 CLINICAL DATA:  Chest pain, recent pneumonia. EXAM: PORTABLE CHEST 1 VIEW COMPARISON:  Prior chest radiographs 09/14/2019 and earlier FINDINGS: The cardiac silhouette is partially obscured, limiting evaluation of heart size. Aortic atherosclerosis. Redemonstrated opacity within the left mid to lower lung field consistent with pleural effusion with atelectasis and/or  consolidation. Background interstitial prominence may reflect interstitial edema. No right pleural effusion or evidence of pneumothorax. Redemonstrated left chest pacer device. No acute bony abnormality identified. Sequela of prior vertebral augmentation at multiple levels. IMPRESSION: Increased opacity within the left mid to lower lung field consistent with pleural effusion with underlying atelectasis and/or consolidation. Radiographic follow-up to resolution is recommended. Unchanged cardiomegaly with probable mild background interstitial edema. Electronically Signed   By: Kellie Simmering DO   On: 09/23/2019 11:13   DG Chest Port 1 View  Result Date: 09/14/2019 CLINICAL DATA:  Altered mental status. The fell and hit head on 04/24, followed by a normal head CT. EXAM: PORTABLE CHEST 1 VIEW COMPARISON:  08/31/2019 FINDINGS: Patient has LEFT-sided transvenous pacemaker with leads to the RIGHT atrium and RIGHT ventricle. The heart is enlarged. There is increased opacity at the LEFT lung base, consistent with atelectasis or infiltrate. Suspect LEFT pleural effusion. The increased prominence of interstitial markings raises a question of interstitial edema. IMPRESSION: 1. Cardiomegaly and possible interstitial edema. 2. Increased LEFT lower lobe atelectasis or infiltrate. 3. Probable LEFT pleural effusion. Electronically Signed   By: Nolon Nations M.D.   On: 09/14/2019 19:18   DG Chest [facility]  Result Date: 08/31/2019 CLINICAL DATA:  Shortness of breath EXAM: PORTABLE CHEST 1 VIEW COMPARISON:  August 16, 2019 and April 13, 2019 FINDINGS: There is suspected scarring in the left base. There may also be epicardial fat prominence in this area. The appearance is stable. There is no apparent edema or airspace opacity. Heart is upper normal in size with pulmonary vascularity normal. Pacemaker lead tips are attached to the right atrium and right ventricle. No adenopathy. There is aortic atherosclerosis. Bones are  osteoporotic. Patient is undergone prior kyphoplasty procedure in the lower thoracic region IMPRESSION: Suspect a degree of scarring in the lateral left base. No edema or airspace opacity. Stable cardiac silhouette. Pacemaker leads attached to right atrium and right ventricle. Bones osteoporotic. Aortic Atherosclerosis (ICD10-I70.0). Electronically Signed   By: Lowella Grip III M.D.   On: 08/31/2019 15:29   IR KYPHO THORACIC WITH BONE BIOPSY  Result Date: 09/06/2019 INDICATION: Valerie Terrell is a 86 year old. Female with a past medical history of hypertension, hyperlipidemia, sick sinus syndrome, HF, paroxysmal atrial fibrillation not on chronic anticoagulation, [place] 2016, CVA 2017, ESRD on HD, nephrolithiasis, osteopenia, and multiple fractures (thoracic compression fractures, right hip fracture, metatarsal fracture). She [facility] to [facility] ED 09/01/2019 with complaints of back and abdominal pain. In ED, CT abdomen/pelvis revealed diverticulitis and a new T11 compression fracture. EXAM: Fluoroscopy guided T11 kyphoplasty and biopsy COMPARISON:  MRI of the thoracic spine July 24, 2018; CT of the abdomen August 31, 2019 MEDICATIONS: As antibiotic prophylaxis, Ancef 2 gm IV was ordered pre-procedure and administered intravenously within 1 hour of incision. ANESTHESIA/SEDATION: Moderate (conscious) sedation was employed during this procedure. A total of Versed 1.5 mg and Fentanyl 50 mcg was administered intravenously. Moderate Sedation Time: 45 minutes. The patient's level of consciousness and vital signs were monitored continuously by radiology nursing throughout the procedure under my direct supervision. FLUOROSCOPY TIME:  Fluoroscopy Time: 12 minutes 6 seconds (300 mGy) COMPLICATIONS: None immediate. PROCEDURE: Following a full explanation of the procedure along with the potential associated complications, an informed witnessed consent was obtained. The patient was placed in prone position on the angiography  table. The thoracic spine region was prepped and draped in a sterile fashion. Under fluoroscopy, the the T11 vertebral body was delineated and the skin area was marked. The skin was infiltrated with a 1% Lidocaine approximately 3.5 cm lateral to the spinous process projection on the right. Using a 22-gauge spinal needle, the soft issue and the peripedicular space and periosteum were infiltrated with Bupivacaine 0.25%. A skin incision was made at the access site. Subsequently, an 11-gauge Kyphon trocar was inserted under fluoroscopic guidance until contact with the pedicle was obtained. The trocar was advanced through the pedicle into the vertebral body with light hammer tapping. The diamond mandrill was removed and one core biopsy was obtained. The skin was infiltrated with a 1% Lidocaine approximately 3.5 cm lateral to the spinous process projection on the left. Using a 22-gauge spinal needle, the soft issue and the peripedicular space and periosteum were infiltrated with Bupivacaine 0.25%. A skin incision was made at the access site. Subsequently, an 11-gauge Kyphon trocar was inserted under fluoroscopic guidance until contact with the pedicle was obtained. The trocar was advanced through the pedicle into the vertebral body with light hammer tapping. The diamond mandrill was removed. A bone drill was coaxially advanced within the anterior third of the vertebral body and then exchanged for inflatable Kyphon balloons. These were centered within the mid-aspect of the vertebral body. The  balloons were inflated to create a void to serve as a repository for the bone cement. Both balloons were deflated and through both cannulas, under continuous fluoroscopy guidance in the AP and lateral views. The vertebral body was filled with previously mixed polymethyl-methacrylate (PMMA) added to barium for opacification. Both cannulas were later removed. The access sites were cleaned and covered with a sterile bandage. IMPRESSION:  Successful fluoroscopy-guided bilateral transpedicular approach for T11 vertebral body core bone biopsy and kyphoplasty for treatment of osteoporotic fragility fracture. Bone sample obtained was sent for pathology analysis. Electronically Signed   By: Pedro Earls M.D.   On: 09/06/2019 16:03   CT Maxillofacial Wo Contrast  Result Date: 09/09/2019 CLINICAL DATA:  [place] from bed EXAM: CT MAXILLOFACIAL WITHOUT CONTRAST TECHNIQUE: Multidetector CT imaging of the maxillofacial structures was performed. Multiplanar CT image reconstructions were also generated. COMPARISON:  None. FINDINGS: Osseous: No fracture or mandibular dislocation. No destructive process. Orbits: Negative. No traumatic or inflammatory finding. Sinuses: Clear. Soft tissues: There is mucosal thickening within the left nasal passage. Remaining soft tissues are unremarkable. Limited intracranial: No significant or unexpected finding. IMPRESSION: 1. Mucosal thickening left nasal passage. 2. No acute displaced fracture. Electronically Signed   By: Randa Ngo M.D.   On: 09/09/2019 21:46    Micro Results    Recent Results (from the past 240 hour(s))  SARS CORONAVIRUS 2 (TAT 6-24 HRS) Nasopharyngeal Nasopharyngeal Swab     Status: None   Collection Time: 09/18/19  8:07 AM   Specimen: Nasopharyngeal Swab  Result Value Ref Range Status   SARS Coronavirus 2 NEGATIVE NEGATIVE Final    Comment: (NOTE) SARS-CoV-2 target nucleic acids are NOT DETECTED. The SARS-CoV-2 RNA is generally detectable in upper and lower respiratory specimens during the acute phase of infection. Negative results do not preclude SARS-CoV-2 infection, do not rule out co-infections with other pathogens, and should not be used as the sole basis for treatment or other patient management decisions. Negative results must be combined with clinical observations, patient history, and epidemiological information. The expected result is Negative. Fact Sheet  for Patients: SugarRoll.be Fact Sheet for Healthcare Providers: https://www.woods-mathews.com/ This test is not yet approved or cleared by the [place] FDA and  has been authorized for detection and/or diagnosis of SARS-CoV-2 by FDA under an Emergency Use Authorization (EUA). This EUA will remain  in effect (meaning this test can be used) for the duration of the COVID-19 declaration under Section 56 4(b)(1) of the Act, 21 U.S.C. section 360bbb-3(b)(1), unless the authorization is terminated or revoked sooner. Performed at [facility], [place] [address] [place], [place] [address]   Respiratory Panel by RT PCR (Flu A&B, Covid) - Nasopharyngeal Swab     Status: None   Collection Time: 09/23/19  1:39 PM   Specimen: Nasopharyngeal Swab  Result Value Ref Range Status   SARS Coronavirus 2 by RT PCR NEGATIVE NEGATIVE Final    Comment: (NOTE) SARS-CoV-2 target nucleic acids are NOT DETECTED. The SARS-CoV-2 RNA is generally detectable in upper respiratoy specimens during the acute phase of infection. The lowest concentration of SARS-CoV-2 viral copies this assay can detect is 131 copies/mL. A negative result does not preclude SARS-Cov-2 infection and should not be used as the sole basis for treatment or other patient management decisions. A negative result may occur with  improper specimen collection/handling, submission of specimen other than nasopharyngeal swab, presence of viral mutation(s) within the areas targeted by this assay, and inadequate number of  viral copies (<131 copies/mL). A negative result must be combined with clinical observations, patient history, and epidemiological information. The expected result is Negative. Fact Sheet for Patients:  PinkCheek.be Fact Sheet for Healthcare Providers:  GravelBags.it This test is not yet ap proved or cleared by the [place]  FDA and  has been authorized for detection and/or diagnosis of SARS-CoV-2 by FDA under an Emergency Use Authorization (EUA). This EUA will remain  in effect (meaning this test can be used) for the duration of the COVID-19 declaration under Section 564(b)(1) of the Act, 21 U.S.C. section 360bbb-3(b)(1), unless the authorization is terminated or revoked sooner.    Influenza A by PCR NEGATIVE NEGATIVE Final   Influenza B by PCR NEGATIVE NEGATIVE Final    Comment: (NOTE) The Xpert Xpress SARS-CoV-2/FLU/RSV assay is intended as an aid in  the diagnosis of influenza from Nasopharyngeal swab specimens and  should not be used as a sole basis for treatment. Nasal washings and  aspirates are unacceptable for Xpert Xpress SARS-CoV-2/FLU/RSV  testing. Fact Sheet for Patients: PinkCheek.be Fact Sheet for Healthcare Providers: GravelBags.it This test is not yet approved or cleared by the [place] FDA and  has been authorized for detection and/or diagnosis of SARS-CoV-2 by  FDA under an Emergency Use Authorization (EUA). This EUA will remain  in effect (meaning this test can be used) for the duration of the  Covid-19 declaration under Section 564(b)(1) of the Act, 21  U.S.C. section 360bbb-3(b)(1), unless the authorization is  terminated or revoked. Performed at [facility], [place] [address] [place], [place] [address]   MRSA PCR Screening     Status: None   Collection Time: 09/24/19 12:44 AM   Specimen: Nasal Mucosa; Nasopharyngeal  Result Value Ref Range Status   MRSA by PCR NEGATIVE NEGATIVE Final    Comment:        The GeneXpert MRSA Assay (FDA approved for NASAL specimens only), is one component of a comprehensive MRSA colonization surveillance program. It is not intended to diagnose MRSA infection nor to guide or monitor treatment for MRSA infections. Performed at [facility], [place] [address]  [place], [place] [address]        Today   Subjective:   Valerie Terrell today has no headache,no chest or abdominal pain, reports she did tolerate her hemodialysis well today .   Objective:   Blood pressure (!) 102/41, pulse 82, temperature 98.4 F (36.9 C), resp. rate 19, height 5\' 2"  (1.575 m), weight 49.2 kg, SpO2 98 %.   Intake/Output Summary (Last 24 hours) at 09/25/2019 1453 Last data filed at 09/25/2019 0600 Gross per 24 hour  Intake 240 ml  Output --  Net 240 ml    Exam Awake Alert, Oriented x 3, No new F.N deficits, Normal affect, extremely frail Symmetrical Chest wall movement, diminished air entry in left lung base. RRR,No Gallops,Rubs or new Murmurs, No Parasternal Heave +ve B.Sounds, Abd Soft, Non tender, No organomegaly appriciated, No rebound -guarding or rigidity. No Cyanosis, Clubbing or edema, No new Rash or bruise  Data Review   CBC w Diff:  Lab Results  Component Value Date   WBC 13.5 (H) 09/25/2019   HGB 9.2 (L) 09/25/2019   HGB 12.2 06/17/2016   HCT 29.9 (L) 09/25/2019   HCT 38.4 06/17/2016   PLT 233 09/25/2019   PLT 266 06/17/2016   LYMPHOPCT 3 09/23/2019   MONOPCT 8 09/23/2019   EOSPCT 1 09/23/2019   BASOPCT 0 09/23/2019  CMP:  Lab Results  Component Value Date   NA 137 09/25/2019   NA 138 06/23/2018   K 4.2 09/25/2019   CL 98 09/25/2019   CO2 28 09/25/2019   BUN 17 09/25/2019   BUN 30 (A) 06/23/2018   CREATININE 3.83 (H) 09/25/2019   CREATININE 1.63 (H) 03/24/2016   GLU 128 09/01/2016   PROT 5.9 (L) 09/23/2019   PROT 6.1 06/17/2016   ALBUMIN 1.9 (L) 09/25/2019   ALBUMIN 3.7 06/17/2016   BILITOT 0.6 09/23/2019   BILITOT 0.2 06/17/2016   ALKPHOS 89 09/23/2019   AST 19 09/23/2019   ALT 10 09/23/2019  .   Total Time in preparing paper work, data evaluation and todays exam - 39 minutes  Phillips Climes M.D on 09/25/2019 at 2:53 PM  [facility]   Office  [phone]

## 2019-09-25 NOTE — Progress Notes (Signed)
[facility] Progress Note   Subjective:   Patient seen at start of HD. Reports she is feeling better. Denies SOB, orthopnea, dizziness, CP, palpitations, abdominal pain, N/V/D. Continued O2 requirement. Reports she wants to continue HD "for now," but will let us know if that changes. Hoping to return home.   Objective Vitals:   09/24/19 1534 09/24/19 2049 09/25/19 0519 09/25/19 0746  BP: (!) 127/31 (!) 133/50 102/86 (!) 122/40  Pulse: 81 96 84   Resp: 19 18 18 16   Temp: 97.9 F (36.6 C) 98.4 F (36.9 C) 98.1 F (36.7 C) 97.9 F (36.6 C)  TempSrc: Oral Oral Oral   SpO2: 90% 98% 99% 95%  Weight:      Height:       Physical Exam General: Frail, chronically ill appearing female, alert and in NAD Heart: RRR, no murmurs, rubs or gallops Lungs: Decreased breath sounds on L, R without wheezing, rhonchi or rales. Respirations unlabored  Abdomen: Soft, non-tender, non-distended, +BS Extremities: No edema b/l lower extremities Dialysis Access:  RUE AVG currently accessed  Additional Objective Labs: Basic Metabolic Panel: Recent Labs  Lab 09/23/19 1019 09/24/19 0543  NA 135 137  K 4.8 3.7  CL 93* 96*  CO2 31 29  GLUCOSE 91 85  BUN 15 <5*  CREATININE 4.41* 2.06*  CALCIUM 8.8* 8.0*  PHOS  --  2.8   Liver Function Tests: Recent Labs  Lab 09/23/19 1019  AST 19  ALT 10  ALKPHOS 89  BILITOT 0.6  PROT 5.9*  ALBUMIN 2.0*   Recent Labs  Lab 09/23/19 1019  LIPASE 104*   CBC: Recent Labs  Lab 09/23/19 1019 09/24/19 0543  WBC 14.1* 10.7*  NEUTROABS 12.3*  --   HGB 10.9* 10.1*  HCT 35.3* 33.5*  MCV 100.6* 100.0  PLT 289 241   Blood Culture    Component Value Date/Time   SDES BLOOD LEFT HAND 09/15/2019 1000   SPECREQUEST  09/15/2019 1000    BOTTLES DRAWN AEROBIC AND ANAEROBIC Blood Culture adequate volume   CULT  09/15/2019 1000    NO GROWTH 5 DAYS Performed at [facility], [place] [address], [place], [place] [address]    REPTSTATUS  09/20/2019 FINAL 09/15/2019 1000    Studies/Results: CT Abdomen Pelvis Wo Contrast  Result Date: 09/23/2019 CLINICAL DATA:  Left chest pain and left shoulder pain. History of recently treated pneumonia. Patient on dialysis. EXAM: CT ABDOMEN AND PELVIS WITHOUT CONTRAST TECHNIQUE: Multidetector CT imaging of the abdomen and pelvis was performed following the standard protocol without IV contrast. COMPARISON:  August 31, 2019 FINDINGS: Lower chest: Enlarged heart. Calcific atherosclerotic disease of the coronary arteries and aorta. Bilateral small pleural effusions. Left greater than right lung base airspace consolidation versus atelectasis. Hepatobiliary: No focal liver abnormality is seen. Layering gallbladder sludge/stones without secondary signs of acute cholecystitis. Pancreas: Unremarkable. No pancreatic ductal dilatation or surrounding inflammatory changes. Spleen: Normal in size without focal abnormality. Adrenals/Urinary Tract: Adrenal glands are unremarkable. Atrophic kidneys. Bilateral renal cysts. Stomach/Bowel: Stomach is within normal limits. No evidence of bowel wall thickening, distention, or inflammatory changes. Diffuse colonic diverticulosis without evidence of acute diverticulitis. Vascular/Lymphatic: Severe atherosclerosis and ectasia of the abdominal aorta. No enlarged abdominal or pelvic lymph nodes. Reproductive: Status post hysterectomy. No adnexal masses. Other: No abdominal wall hernia or abnormality. No abdominopelvic ascites. Musculoskeletal: Chronic T12 compression fracture post interval kyphoplasty. Chronic T9 compression fracture post kyphoplasty without significant alignment changes. IMPRESSION: 1. Bilateral small pleural effusions. Left  greater than right lung base infectious consolidation versus atelectasis. 2. Cholelithiasis without secondary signs of acute cholecystitis. 3. Atrophic kidneys consistent with chronic renal insufficiency. 4. Diffuse colonic diverticulosis without  evidence of acute diverticulitis. 5. Severe atherosclerosis and ectasia of the abdominal aorta. 6. Post interval kyphoplasty of T12 compression fracture without significant alignment changes. Aortic Atherosclerosis (ICD10-I70.0). Electronically Signed   By: Fidela Salisbury M.D.   On: 09/23/2019 11:47   DG Chest Port 1 View  Result Date: 09/23/2019 CLINICAL DATA:  Chest pain, recent pneumonia. EXAM: PORTABLE CHEST 1 VIEW COMPARISON:  Prior chest radiographs 09/14/2019 and earlier FINDINGS: The cardiac silhouette is partially obscured, limiting evaluation of heart size. Aortic atherosclerosis. Redemonstrated opacity within the left mid to lower lung field consistent with pleural effusion with atelectasis and/or consolidation. Background interstitial prominence may reflect interstitial edema. No right pleural effusion or evidence of pneumothorax. Redemonstrated left chest pacer device. No acute bony abnormality identified. Sequela of prior vertebral augmentation at multiple levels. IMPRESSION: Increased opacity within the left mid to lower lung field consistent with pleural effusion with underlying atelectasis and/or consolidation. Radiographic follow-up to resolution is recommended. Unchanged cardiomegaly with probable mild background interstitial edema. Electronically Signed   By: Kellie Simmering DO   On: 09/23/2019 11:13   Medications:  . amiodarone  200 mg Oral Daily  . aspirin EC  325 mg Oral QHS  . calcitonin (salmon)  1 spray Alternating Nares Daily  . Chlorhexidine Gluconate Cloth  6 each Topical Q0600  . Chlorhexidine Gluconate Cloth  6 each Topical Q0600  . doxercalciferol  2.5 mcg Oral Once per day on Tue Thu Sat  . guaiFENesin  600 mg Oral Daily  . heparin  5,000 Units Subcutaneous Q8H  . heparin sodium (porcine)      . isosorbide mononitrate  60 mg Oral q1800  . lidocaine-prilocaine  1 application Topical See admin instructions  . pravastatin  40 mg Oral QHS  . sevelamer carbonate   1,600 mg Oral TID WC  . traZODone  50 mg Oral QHS    Dialysis Orders: [facility] - MTTS AVG 4hrs 350/600 2K 2Ca  Hectorol 19mcg Hep 1400 units mircera 60 q2wks - not given   Assessment/Plan: 1.FTT- multiple recent admission/ER visits, palliative care consulting. Current plan is for patient to return home with home health. Does have signfiicant back pain at baseline which makes HD difficult but wants to continue HD "for now." Patient is a former palliative care RN.  2. L sided chest pain - CXR/CT w/slightly larger pleural effusion. Per PCCM unlikely to benefit from thoracentesis. Remains on O2.  3.Back pain/Hx T11 compression fracture - chronic back pain, on PRN oxycodone 2. ESRD- on HD MTTS. Orders written for HD tomorrow per regular schedule. K 3.7 3. Anemiaof CKD-Hgb 10.1. Aranesp last dosed 5/4, due with next HD 4. Secondary hyperparathyroidism- Ca in goal. Continue VDRA. Phos 2.8, will reduce binder dose.  5.HTN/volume- BP well controlled today. Volume status improved from usual baseline as OP. Slightly larger pleural effusions seen on CT, titrate down as tolerated. 6. Nutrition- Renal diet w/fluid restrictions.   Anice Paganini, PA-C 09/25/2019, 9:28 AM  [facility] Pager: [phone]

## 2019-09-25 NOTE — Progress Notes (Signed)
Initial Nutrition Assessment  DOCUMENTATION CODES:   Not applicable  INTERVENTION:   -30 ml Prostat BID, each supplement provides 100 kcals and 15 grams protein -Magic cup TID with meals, each supplement provides 290 kcal and 9 grams of protein -Renal MVI daily -Consider liberalizing diet if poor oral intake persists  NUTRITION DIAGNOSIS:   Inadequate oral intake related to decreased appetite as evidenced by per patient/family report.  GOAL:   Patient will meet greater than or equal to 90% of their needs  MONITOR:   Supplement acceptance, PO intake, Labs, Weight trends, Skin, I & O's  REASON FOR ASSESSMENT:   Consult Assessment of nutrition requirement/status  ASSESSMENT:   Valerie Terrell is a 86 y.o. female with medical history significant of chronic back pain; CVA; pacemaker placement; afib, not on AC due to falls; HTN; HLD; ESRD on TTS HD; and chronic combined CHF presenting with L-sided CP.  She has had 4 ER visits and 4 admissions in the last 6 months.  Her most recent admission was 4/29-5/5 for LLL CAP, treated with Rocephin.  She was discharged late Wednesday and went to [facility] for short-term rehab.  Her daughter went to check on her this morning - sleeping and woke up with terrible chest pain down her left arm, not sleeping well, "everything hurt."  No cough or SOB.  She has been on O2 since the last hospitalization, still 2.5 L since last hospitalization.  Pt admitted with FTT.   5/9- s/p BSE- recommending regular consistency diet with thin liquids  Reviewed I/O's: +240 ml x 24 hours and -1.8 L since admission  Pt in HD at time of visit. Unable to obtain further nutrition-related history or complete nutrition-focused physical exam at this time,   Per chart review, pt has experienced a general decline in health due to multiple hospitalizations and recent SNF stay. Per palliative care notes, pt wants to eat, but food is often not appealing to her. No meal  completion records available to assess at this time.   Per recent RD note from recent hospital admission, pt with moderate malnutrition in context of chronic illness. RD suspects malnutrition is ongoing, however, unable to identify at this time.   Unsure of EDW. Reviewed wt hx; pt has experienced a 8.9% wt loss over the past month, which is significant for time frame and also concerning given pt's history of malnutrition and overall FTT.   Palliative care following; pt would like to continue to treat the treatable and transition home with palliative care services. She would like to continue HD treatments at this time.   Labs reviewed.   Diet Order:   Diet Order            Diet renal with fluid restriction Fluid restriction: 1200 mL Fluid; Room service appropriate? Yes with Assist; Fluid consistency: Thin  Diet effective now              EDUCATION NEEDS:   No education needs have been identified at this time  Skin:  Skin Integrity Issues:: Stage I, Incisions Stage I: sacrum Incisions: back  Last BM:  09/24/19  Height:   Ht Readings from Last 1 Encounters:  09/23/19 5\' 2"  (1.575 m)    Weight:   Wt Readings from Last 1 Encounters:  09/24/19 47.2 kg    Ideal Body Weight:  50 kg  BMI:  Body mass index is 19.03 kg/m.  Estimated Nutritional Needs:   Kcal:  1400-1600  Protein:  60-75 grams  Fluid:  1000 ml + UOP    Loistine Chance, RD, LDN, [place] Registered Dietitian II Certified Diabetes Care and Education Specialist Please refer to [facility] for RD and/or RD on-call/weekend/after hours pager

## 2019-09-25 NOTE — TOC Transition Note (Signed)
Transition of Care [facility]) - CM/SW Discharge Note   Patient Details  Name: Valerie Terrell MRN: [id number] Date of Birth: April 23, 1933  Transition of Care [facility]) CM/SW Contact:  Maryclare Labrador, RN Phone Number: 09/25/2019, 3:32 PM   Clinical Narrative:   Pt deemed stable for discharge today to the home setting.  [facility] will provide POC to unit before discharge but will not deliver home oxygen  concentrator equipment until pt arrives home.     CM confirmed with the daughter that pt will discharge home and daughter will contact [facility] once pt arrives home.  POC will last for 4 hours with battery - daughter aware.  [facility] for :  Therapist, sports, PT, OT , aide and SW.  NO other CM needs determined - CM signing off    Final next level of care: Home w Home Health Services Barriers to Discharge: Barriers Resolved   Patient Goals and CMS Choice Patient states their goals for this hospitalization and ongoing recovery are:: to go home w daughter CMS Medicare.gov Compare Post Acute Care list provided to:: Other (Comment Required) Choice offered to / list presented to : Adult Children  Discharge Placement                       Discharge Plan and Services   Discharge Planning Services: CM Consult Post Acute Care Choice: Home Health, Durable Medical Equipment          DME Arranged: Oxygen DME Agency: [facility] Date DME Agency Contacted: 09/24/19 Time DME Agency Contacted: [phone] Representative spoke with at DME Agency: Caryl Pina HH Arranged: RN, PT, OT, Nurse's Aide, Social Work [employer] Agency: [facility]        Social Determinants of Health ([place]) Interventions     Readmission Risk Interventions Readmission Risk Prevention Plan 10/06/2018  Transportation Screening Complete  Medication Review Press photographer) Complete  PCP or Specialist appointment within 3-5 days of discharge Complete  HRI or [place] Complete  SW  Recovery Care/Counseling Consult Complete  [place] Complete  Some recent data might be hidden

## 2019-09-25 NOTE — Progress Notes (Signed)
   09/25/19 1455  Clinical Encounter Type  Visited With Patient and family together  Visit Type Initial;Spiritual support  Referral From Palliative care team  Consult/Referral To Chaplain  Spiritual Encounters  Spiritual Needs Emotional  Stress Factors  Patient Stress Factors Exhausted  This chaplain responded to PMT consult for ongoing spiritual support. The chaplain introduced herself as the chaplain with the PMT. The Pt. daughter-Allison was bedside folding and gathering items.  [name] explained and the chaplain accepted the Pt. is often exhausted after HD. [name] shared with the chaplain the Pt. is discharging soon. [name] declined spiritual care for herself from the chaplain.

## 2019-09-25 NOTE — Progress Notes (Signed)
Patient discharge home  Shortly after returning from [facility] dialysis. Patient BP soft so help Pacerone Dr Landis Gandy. Discharge instructions reviewed with daughter and patient. Prescriptions in discharge envelope. IV removed and sit is be

## 2019-09-26 ENCOUNTER — Telehealth: Payer: Self-pay

## 2019-09-26 DIAGNOSIS — E8770 Fluid overload, unspecified: Secondary | ICD-10-CM | POA: Diagnosis not present

## 2019-09-26 DIAGNOSIS — D509 Iron deficiency anemia, unspecified: Secondary | ICD-10-CM | POA: Diagnosis not present

## 2019-09-26 DIAGNOSIS — D689 Coagulation defect, unspecified: Secondary | ICD-10-CM | POA: Diagnosis not present

## 2019-09-26 DIAGNOSIS — Z992 Dependence on renal dialysis: Secondary | ICD-10-CM | POA: Diagnosis not present

## 2019-09-26 DIAGNOSIS — N186 End stage renal disease: Secondary | ICD-10-CM | POA: Diagnosis not present

## 2019-09-26 DIAGNOSIS — D631 Anemia in chronic kidney disease: Secondary | ICD-10-CM | POA: Diagnosis not present

## 2019-09-26 DIAGNOSIS — E876 Hypokalemia: Secondary | ICD-10-CM | POA: Diagnosis not present

## 2019-09-26 DIAGNOSIS — N2581 Secondary hyperparathyroidism of renal origin: Secondary | ICD-10-CM | POA: Diagnosis not present

## 2019-09-26 NOTE — Telephone Encounter (Signed)
LM requesting call back to complete TCM and schedule hospital follow up.   

## 2019-09-27 ENCOUNTER — Telehealth (INDEPENDENT_AMBULATORY_CARE_PROVIDER_SITE_OTHER): Payer: Medicare Other | Admitting: Family Medicine

## 2019-09-27 ENCOUNTER — Encounter: Payer: Self-pay | Admitting: Family Medicine

## 2019-09-27 VITALS — BP 142/68 | HR 86

## 2019-09-27 DIAGNOSIS — J189 Pneumonia, unspecified organism: Secondary | ICD-10-CM

## 2019-09-27 DIAGNOSIS — J9601 Acute respiratory failure with hypoxia: Secondary | ICD-10-CM | POA: Diagnosis not present

## 2019-09-27 DIAGNOSIS — J9611 Chronic respiratory failure with hypoxia: Secondary | ICD-10-CM

## 2019-09-27 DIAGNOSIS — N186 End stage renal disease: Secondary | ICD-10-CM

## 2019-09-27 DIAGNOSIS — Z992 Dependence on renal dialysis: Secondary | ICD-10-CM

## 2019-09-27 DIAGNOSIS — G894 Chronic pain syndrome: Secondary | ICD-10-CM | POA: Diagnosis not present

## 2019-09-27 MED ORDER — FENTANYL 50 MCG/HR TD PT72: 1.0000 | MEDICATED_PATCH | TRANSDERMAL | 0 refills | Status: DC

## 2019-09-27 MED ORDER — OXYCODONE HCL 10 MG PO TABS: ORAL_TABLET | ORAL | 0 refills | Status: AC

## 2019-09-27 MED ORDER — FENTANYL 50 MCG/HR TD PT72: 1.0000 | MEDICATED_PATCH | TRANSDERMAL | 0 refills | Status: AC

## 2019-09-27 NOTE — Progress Notes (Signed)
Virtual Visit via Video Note  I connected with pt on 09/27/19 at 11:30 AM EDT by a video enabled telemedicine application and verified that I am speaking with the correct person using two identifiers.  Location patient: home Location provider:work or home office Persons participating in the virtual visit: patient, pt's daughter Bryson Ha, provider  I discussed the limitations of evaluation and management by telemedicine and the availability of in person appointments. The patient expressed understanding and agreed to proceed.  Telemedicine visit is a necessity given the COVID-19 restrictions in place at the current time.  HPI:  Patient is a 86 y.o. Caucasian female who presents for  hospital follow up, specifically Transitional Care Services face-to-face visit. Dates hospitalized: 5/8-5/10, 2021. Days since d/c from hospital: 2 Patient was discharged from hospital to home with palliative care ([facility]). Reason for admission to hospital: presented with CP-->found to have pleural effusion, failure to thrive-->in the context of PAF, chronic combined systolic and diastolic HF, ESRD on HD, chronic back pain, and recurrent L sided CP. Date of interactive (phone) contact with patient and/or caregiver: attempt x 1 yesterday, VM message left.  I have reviewed patient's discharge summary plus pertinent specific notes, labs, and imaging from the hospitalization.   Pt had been admitted 4/29-5/5, 2021 for LLL CAP and was on 2.5L oxygen since that hospitalization. Initial CXR question of L effusion and worsening of consolidation.  Pulm felt like likely atelectasis, no thoracentesis indicated. Ruled out ACS->CP determined to be musculoskeletal +/- contribution from her L lung pneumonia/?small effusion.  CURRENTLY: Valerie Terrell says she feels good today.  No cough, fever, CP, or SOB. However, she could not tolerate sitting for HD yesterday, did only 30-45 min of a treatment, sites sacral/coccyx pain as the  primary problem.  Plans to try donut for HD tomorrow.   Oxygen 2L.  She is bedbound at this time. Sat 96% on RA this morning at rest per daughter's measurement.  RA sat in hospital <88%.  Rolls side to side and sits up in bed w/out assistance at home. Wt bearing very unsteady and not able to ambulate with walker yet.  No oxygen check at home after any activity has been done yet but daughter plans on doing so this afternoon.   Medication reconciliation was done today and patient is taking meds as recommended by discharging hospitalist/specialist.    PMH:  Past Medical History:  Diagnosis Date  . Anemia of chronic disease 2018   + anemia of CRI?  [name] Atrophy of left kidney    with absent blood flow by renal artery dopplers (Dr. Gwenlyn Found)  . 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 Chronic combined systolic and diastolic CHF (congestive heart failure) ([place])    a. 11/2016: echo showing EF of 35-40%, RV strain noted, mild MR and mild TR.   Echo showed much improved EF 12/2017 (55-60%)  . Chronic respiratory failure with hypoxia ([place]) 02/11/2017   Now on chronic O2 (intermittent as of summer 2019).    . Closed fracture of right superior pubic ramus ([place]) 07/18/2018   Non op mgmt (Dr. Victorino December was ortho who saw her in hosp but i don't think pt followed up with him).  . Closed right hip fracture ([place]) 10/04/2018   ORIF  . Debilitated patient    WC dependent as of 2018.  Hosp bed + full assistance with most ADLs s/p hip fx surg 09/2018.  [name] ESRD on hemodialysis ([place]) 01/2017   T/Th/Sat schedule- [facility].  Right basilic AV fistula 10/2374.  Graft 2019.  [name] History of adenomatous polyp of colon   . History of kidney stones   . History of subarachnoid hemorrhage 10/2014   after syncope and while on xarelto  . Hyperlipidemia   . Hypertension    Difficult to control, in the setting of one functioning kidney: pt was referred to nephrology by Dr. Gwenlyn Found 06/2015.  . Lumbar radiculopathy  2012  . Lumbar spinal stenosis 2019   facet injections only very short term relief.  L1 and L2 selective nerve root blocks helpful 04/2018.  . Lumbar spondylosis    MR 07/2016---no sign of spinal nerve compression or cord compression.  Pt set up with outpt ortho while admitted to hosp 07/2016.  . Malnourished ([place])   . Metatarsal fracture 06/10/2016   Nondisplaced, left 5th metatarsal--pt was referred to ortho  . Osteopenia 2014   T-score -2.1  . PAF (paroxysmal atrial fibrillation) (HCC)    Eliquis started after BRAO and CVA.  She was changed to warfarin 2018. All anticoag stopped due to recurrent falls.  . Presence of permanent cardiac pacemaker   . Right rib fracture 12/2016   s/p fall  . Sick sinus syndrome ([place]) 11/2016   Dual chamber pacer insertion 2018 (Dr. Lovena Le)  . Stroke ([place]) 28/3151   cardioembolic (had CVA while on no anticoag)--"scattered subacute punctate infarcts: 1 in R parietal lobe and 2 in occipital cortex" on MRI br.  CT angio head/neck: aortic arch athero.  R ICA 20% stenosis, L ICA w/out any stenosis.  . Thoracic back pain 01/2017; 01/2018   2018: Facet?  Dr. Ramos->steroid injection.  01/2018-->CT T spine to check for a new comp fx-->none found.  Selective nerve root inj in T spine helpful on R side.   . Thoracic compression fracture ([place]) 07/2016   T3.  T7 and T8-- T8 kyphoplasty during hosp admission 07/2016.  Neuro referred pt to pain mgmt for consideration of injection 09/2016.  I referred her to endo 08/2016 for consideration of calcitonin treatment.    PSH:  Past Surgical History:  Procedure Laterality Date  . APPENDECTOMY  child  . AV FISTULA PLACEMENT Right 03/31/2017   Procedure: Right ARM Basilic vein transposition;  Surgeon: Conrad Cornish, MD;  Location: [facility];  Service: Vascular;  Laterality: Right;  . AV FISTULA PLACEMENT Right 12/06/2017   Procedure: INSERTION OF ARTERIOVENOUS (AV) GORE-TEX GRAFT ARM RIGHT UPPER EXTREMITY;  Surgeon: [name], MD;   Location: [facility];  Service: Vascular;  Laterality: Right;  . CARDIOVASCULAR STRESS TEST  01/31/2016   Stress myoview: NORMAL/Low risk.  EF 56%.  . [place]  . COLONOSCOPY  2015   + hx of adenomatous polyps.  Need digest health spec in [facility] records to see when pt due for next colonoscopy  . DECLOT CKV Right 03/21/2019  . EYE SURGERY Bilateral    Catarct  . INSERTION OF DIALYSIS CATHETER Right 01/29/2017   Procedure: INSERTION OF DIALYSIS CATHETER- RIGHT INTERNAL JUGULAR;  Surgeon: [name], MD;  Location: [facility];  Service: Vascular;  Laterality: Right;  . INTRAMEDULLARY (IM) NAIL INTERTROCHANTERIC Right 10/04/2018   Procedure: INTRAMEDULLARY (IM) NAIL INTERTROCHANTRIC;  Surgeon: Nicholes Stairs, MD;  Location: [facility];  Service: Orthopedics;  Laterality: Right;  . IR GENERIC HISTORICAL  07/24/2016   IR KYPHO THORACIC WITH BONE BIOPSY 07/24/2016 Luanne Bras, MD [facility]-INTER[facility] RAD  . IR KYPHO THORACIC WITH BONE BIOPSY  09/06/2019  .  KYPHOPLASTY  07/27/2016   T8  . PACEMAKER IMPLANT N/A 12/03/2016   Procedure: Pacemaker Implant;  Surgeon: Evans Lance, MD;  Location: [facility];  Service: Cardiovascular;  Laterality: N/A;  . Renal artery dopplers  02/26/2016   Her right renal dimension was 11 cm pole to pole with mild to moderate right renal artery stenosis. A right renal aortic ratio was 3.22 suggesting less than a 50% stenosis.  . Right upper arm AV Gore-Tex graft  12/06/2017   Dr. Donnetta Hutching  . TONSILLECTOMY    . TRANSTHORACIC ECHOCARDIOGRAM  01/29/2016; 11/2016; 12/2017   2017: EF 55-60%, normal LV wall motion, grade I DD.  No cardiac source of emboli was seen.  2018: EF 35-40%, could not assess DD due to a-fib, pulm HTN noted. 12/2017 EF 55-60%, nl wall motion, grd II DD, mod MR and pulm regurg, increased pulm pressure (peak 52).    MEDS:  Outpatient Medications Prior to Visit  Medication Sig Dispense Refill  . acetaminophen (TYLENOL) 500 MG tablet Take 1,000 mg  by mouth 3 (three) times daily as needed for moderate pain.     . Amino Acids-Protein Hydrolys (FEEDING SUPPLEMENT, PRO-STAT SUGAR FREE 64,) LIQD Take 30 mLs by mouth 2 (two) times daily. 887 mL 0  . amiodarone (PACERONE) 200 MG tablet TAKE 1 TABLET BY MOUTH  DAILY (Patient taking differently: Take 200 mg by mouth daily. ) 90 tablet 1  . aspirin EC 325 MG tablet Take 325 mg by mouth at bedtime.     . B Complex-C-Folic Acid TABS Take 1 tablet by mouth See admin instructions. Take one tablet by mouth Tuesday, Thursday, Saturday after dialysis    . calcitonin, salmon, (MIACALCIN/FORTICAL) 200 UNIT/ACT nasal spray Place 1 spray into alternate nostrils daily.     . Calcium Carbonate Antacid (CALCIUM CARBONATE, DOSED IN MG ELEMENTAL CALCIUM,) 1250 MG/5ML SUSP Take 5 mLs (500 mg of elemental calcium total) by mouth every 6 (six) hours as needed for indigestion. 450 mL 0  . cholecalciferol (VITAMIN D3) 25 MCG (1000 UNIT) tablet Take 5,000 Units by mouth at bedtime.    . DENTA 5000 PLUS 1.1 % CREA dental cream Place 1 application onto teeth at bedtime.     . docusate sodium (COLACE) 100 MG capsule Take 100 mg by mouth daily as needed for mild constipation.     [name] doxercalciferol (HECTOROL) 2.5 MCG capsule Take 2.5 mcg by mouth See admin instructions. Take one capsule (2.5 mcg) by mouth on Tuesday, Thursday, Saturday after dialysis    . guaiFENesin (MUCINEX) 600 MG 12 hr tablet Take 600 mg by mouth daily.    . hydrocortisone cream 1 % Apply topically 4 (four) times daily. (Patient taking differently: Apply 1 application topically 4 (four) times daily as needed (rash). ) 30 g 0  . isosorbide mononitrate (IMDUR) 60 MG 24 hr tablet TAKE 1 TABLET BY MOUTH  DAILY ; TAKE 1 TABLET ON  SUN, MON, WED, FRI MORNINGS AND 1 TABLET ON TUES, THU,  SAT AFTER DIALYSIS (Patient taking differently: Take 60 mg by mouth See admin instructions. Take one tablet (60 mg) by mouth on Sunday, Monday, Wednesday, Friday mornings (non-dialysis  days); take one tablet (60 mg) on Tuesday, Thursday, Saturday evenings (dialysis days)) 90 tablet 3  . Lidocaine 4 % PTCH Apply 1 patch topically daily as needed (Mild pain). Remove patch in 12 hours    . lidocaine-prilocaine (EMLA) cream Apply 1 application topically See admin instructions. Apply small amount  to access site (AVF) one hour before dialysis (Tuesday, Thursday, Saturday), cover with occlusive dressing (saran wrap)    . Nutritional Supplements (FEEDING SUPPLEMENT, NEPRO CARB STEADY,) LIQD Take 237 mLs by mouth 3 (three) times daily as needed (Supplement).  0  . ondansetron (ZOFRAN) 4 MG tablet Take 1 tablet (4 mg total) by mouth every 8 (eight) hours as needed for nausea or vomiting. 60 tablet 1  . polyethylene glycol (MIRALAX / GLYCOLAX) 17 g packet Take 17 g by mouth daily as needed for mild constipation.    . polyvinyl alcohol (LIQUIFILM TEARS) 1.4 % ophthalmic solution Place 2 drops into both eyes daily as needed for dry eyes.     . pravastatin (PRAVACHOL) 40 MG tablet TAKE 1 TABLET BY MOUTH  EVERY EVENING (Patient taking differently: Take 40 mg by mouth at bedtime. ) 90 tablet 1  . sevelamer carbonate (RENVELA) 800 MG tablet Take 2 tablets (1,600 mg total) by mouth 3 (three) times daily with meals.    . vitamin C (ASCORBIC ACID) 500 MG tablet Take 500 mg by mouth daily.    . Vitamin D, Ergocalciferol, (DRISDOL) 1.25 MG (50000 UT) CAPS capsule Take 1 capsule (50,000 Units total) by mouth every Thursday. (Patient taking differently: Take 50,000 Units by mouth every Thursday. evening) 12 capsule 3  . zinc sulfate 220 (50 Zn) MG capsule Take 220 mg by mouth daily.     No facility-administered medications prior to visit.    EXAM: BP (!) 142/68 (BP Location: Left Arm, Patient Position: Sitting, Cuff Size: Normal)   Pulse 86   LMP  (LMP Unknown)   SpO2 96% Comment: on 2L Gen: Alert, well appearing.  Patient is oriented to person, place, time, and situation. AFFECT: pleasant, lucid  thought and speech.  Sitting in hosp bed in her home, smiling. No trouble with work of breathing. Pink lips/skin, pt speaking in full sentences.   Pertinent labs/imaging Lab Results  Component Value Date   TSH 2.530 01/13/2019   Lab Results  Component Value Date   WBC 13.5 (H) 09/25/2019   HGB 9.2 (L) 09/25/2019   HCT 29.9 (L) 09/25/2019   MCV 99.7 09/25/2019   PLT 233 09/25/2019   Lab Results  Component Value Date   CREATININE 3.83 (H) 09/25/2019   BUN 17 09/25/2019   NA 137 09/25/2019   K 4.2 09/25/2019   CL 98 09/25/2019   CO2 28 09/25/2019   Lab Results  Component Value Date   ALT 10 09/23/2019   AST 19 09/23/2019   ALKPHOS 89 09/23/2019   BILITOT 0.6 09/23/2019   Lab Results  Component Value Date   CHOL 194 03/28/2018   Lab Results  Component Value Date   HDL 62.10 03/28/2018   Lab Results  Component Value Date   LDLCALC 72 06/17/2016   Lab Results  Component Value Date   TRIG 210.0 (H) 03/28/2018   Lab Results  Component Value Date   CHOLHDL 3 03/28/2018   Lab Results  Component Value Date   HGBA1C 5.2 12/15/2016    ASSESSMENT/PLAN:  1) Pneumonia, acute on chronic hypoxic RF. Stable, she is s/p full course of IV abx while in hospital. Continue aggressive incentive spirometry as well as oxygen. Daughter to check Madylyn's oxygen on RA at rest and after whatever activity she is able to do.  2) Chronic debility due to many comorbidities, most significantly her chronic pain. Continue fentanyl patch as this seems to bring satisfactory pain control and she  has oxycodone 10mg  to use for breakthrough pain.  Daughter feels comfortable that Maryetta has no cognitive impairment/delirium while at home on these meds.  Continue 50 mcg q72, #5. [facility]). Palliative care at this time, no hospice.  3) ESRD, on HD 4 days a week: continue this.  Medical decision making of moderate complexity was utilized today.  FOLLOW UP:  1 month  telemed  Signed:  Crissie Sickles, MD           09/27/2019

## 2019-09-28 DIAGNOSIS — N2581 Secondary hyperparathyroidism of renal origin: Secondary | ICD-10-CM | POA: Diagnosis not present

## 2019-09-28 DIAGNOSIS — D689 Coagulation defect, unspecified: Secondary | ICD-10-CM | POA: Diagnosis not present

## 2019-09-28 DIAGNOSIS — Z992 Dependence on renal dialysis: Secondary | ICD-10-CM | POA: Diagnosis not present

## 2019-09-28 DIAGNOSIS — E8770 Fluid overload, unspecified: Secondary | ICD-10-CM | POA: Diagnosis not present

## 2019-09-28 DIAGNOSIS — N186 End stage renal disease: Secondary | ICD-10-CM | POA: Diagnosis not present

## 2019-09-28 DIAGNOSIS — D631 Anemia in chronic kidney disease: Secondary | ICD-10-CM | POA: Diagnosis not present

## 2019-09-28 DIAGNOSIS — D509 Iron deficiency anemia, unspecified: Secondary | ICD-10-CM | POA: Diagnosis not present

## 2019-09-28 DIAGNOSIS — E876 Hypokalemia: Secondary | ICD-10-CM | POA: Diagnosis not present

## 2019-09-29 ENCOUNTER — Other Ambulatory Visit: Payer: Self-pay | Admitting: Family Medicine

## 2019-09-29 NOTE — Telephone Encounter (Signed)
RF request for Vitamin D 50,000 Units weekly.   Unsure if patient is to continue this medication.  Last RX dated 04/28/2018.  Please advise.

## 2019-09-30 DIAGNOSIS — M5134 Other intervertebral disc degeneration, thoracic region: Secondary | ICD-10-CM | POA: Diagnosis not present

## 2019-09-30 DIAGNOSIS — I340 Nonrheumatic mitral (valve) insufficiency: Secondary | ICD-10-CM | POA: Diagnosis not present

## 2019-09-30 DIAGNOSIS — D631 Anemia in chronic kidney disease: Secondary | ICD-10-CM | POA: Diagnosis not present

## 2019-09-30 DIAGNOSIS — I480 Paroxysmal atrial fibrillation: Secondary | ICD-10-CM | POA: Diagnosis not present

## 2019-09-30 DIAGNOSIS — G894 Chronic pain syndrome: Secondary | ICD-10-CM | POA: Diagnosis not present

## 2019-09-30 DIAGNOSIS — M48061 Spinal stenosis, lumbar region without neurogenic claudication: Secondary | ICD-10-CM | POA: Diagnosis not present

## 2019-09-30 DIAGNOSIS — I5043 Acute on chronic combined systolic (congestive) and diastolic (congestive) heart failure: Secondary | ICD-10-CM | POA: Diagnosis not present

## 2019-09-30 DIAGNOSIS — N186 End stage renal disease: Secondary | ICD-10-CM | POA: Diagnosis not present

## 2019-09-30 DIAGNOSIS — D689 Coagulation defect, unspecified: Secondary | ICD-10-CM | POA: Diagnosis not present

## 2019-09-30 DIAGNOSIS — I429 Cardiomyopathy, unspecified: Secondary | ICD-10-CM | POA: Diagnosis not present

## 2019-09-30 DIAGNOSIS — Z86010 Personal history of colonic polyps: Secondary | ICD-10-CM | POA: Diagnosis not present

## 2019-09-30 DIAGNOSIS — Z87891 Personal history of nicotine dependence: Secondary | ICD-10-CM | POA: Diagnosis not present

## 2019-09-30 DIAGNOSIS — J9611 Chronic respiratory failure with hypoxia: Secondary | ICD-10-CM | POA: Diagnosis not present

## 2019-09-30 DIAGNOSIS — I495 Sick sinus syndrome: Secondary | ICD-10-CM | POA: Diagnosis not present

## 2019-09-30 DIAGNOSIS — M4726 Other spondylosis with radiculopathy, lumbar region: Secondary | ICD-10-CM | POA: Diagnosis not present

## 2019-09-30 DIAGNOSIS — N2581 Secondary hyperparathyroidism of renal origin: Secondary | ICD-10-CM | POA: Diagnosis not present

## 2019-09-30 DIAGNOSIS — D509 Iron deficiency anemia, unspecified: Secondary | ICD-10-CM | POA: Diagnosis not present

## 2019-09-30 DIAGNOSIS — Z992 Dependence on renal dialysis: Secondary | ICD-10-CM | POA: Diagnosis not present

## 2019-09-30 DIAGNOSIS — Z8673 Personal history of transient ischemic attack (TIA), and cerebral infarction without residual deficits: Secondary | ICD-10-CM | POA: Diagnosis not present

## 2019-09-30 DIAGNOSIS — M810 Age-related osteoporosis without current pathological fracture: Secondary | ICD-10-CM | POA: Diagnosis not present

## 2019-09-30 DIAGNOSIS — E8770 Fluid overload, unspecified: Secondary | ICD-10-CM | POA: Diagnosis not present

## 2019-09-30 DIAGNOSIS — Z9181 History of falling: Secondary | ICD-10-CM | POA: Diagnosis not present

## 2019-09-30 DIAGNOSIS — E785 Hyperlipidemia, unspecified: Secondary | ICD-10-CM | POA: Diagnosis not present

## 2019-09-30 DIAGNOSIS — Z950 Presence of cardiac pacemaker: Secondary | ICD-10-CM | POA: Diagnosis not present

## 2019-09-30 DIAGNOSIS — E876 Hypokalemia: Secondary | ICD-10-CM | POA: Diagnosis not present

## 2019-09-30 DIAGNOSIS — I132 Hypertensive heart and chronic kidney disease with heart failure and with stage 5 chronic kidney disease, or end stage renal disease: Secondary | ICD-10-CM | POA: Diagnosis not present

## 2019-10-02 ENCOUNTER — Telehealth: Payer: Self-pay

## 2019-10-02 DIAGNOSIS — N186 End stage renal disease: Secondary | ICD-10-CM | POA: Diagnosis not present

## 2019-10-02 DIAGNOSIS — N2581 Secondary hyperparathyroidism of renal origin: Secondary | ICD-10-CM | POA: Diagnosis not present

## 2019-10-02 DIAGNOSIS — E876 Hypokalemia: Secondary | ICD-10-CM | POA: Diagnosis not present

## 2019-10-02 DIAGNOSIS — D509 Iron deficiency anemia, unspecified: Secondary | ICD-10-CM | POA: Diagnosis not present

## 2019-10-02 DIAGNOSIS — D631 Anemia in chronic kidney disease: Secondary | ICD-10-CM | POA: Diagnosis not present

## 2019-10-02 DIAGNOSIS — Z992 Dependence on renal dialysis: Secondary | ICD-10-CM | POA: Diagnosis not present

## 2019-10-02 DIAGNOSIS — D689 Coagulation defect, unspecified: Secondary | ICD-10-CM | POA: Diagnosis not present

## 2019-10-02 DIAGNOSIS — E8770 Fluid overload, unspecified: Secondary | ICD-10-CM | POA: Diagnosis not present

## 2019-10-02 NOTE — Telephone Encounter (Signed)
OK for [place] orders. OTC imodium bid prn for diarrhea.

## 2019-10-02 NOTE — Telephone Encounter (Signed)
Received VM from [place] from [facility] at home, calling to request [facility] recert once a week x1 wk, 2x a week for 2 weeks and would like to add [facility] aide to help bath 2x week for 2 weeks. She has a stage 1 on coccyx and stage 2 on both buttocks suffering from diaper dermatitis. She is also having diarrhea 3-4 times a day. Would like to know if you can write something to help with this. Reported o2 levels on room is 88% and on 2.5L 95%  Okay for orders and Rx for diarrhea?

## 2019-10-02 NOTE — Telephone Encounter (Signed)
Contacted [facility], advised of PCP recommendations and verbal okay given for [place] orders.

## 2019-10-03 DIAGNOSIS — Z992 Dependence on renal dialysis: Secondary | ICD-10-CM | POA: Diagnosis not present

## 2019-10-03 DIAGNOSIS — N186 End stage renal disease: Secondary | ICD-10-CM | POA: Diagnosis not present

## 2019-10-03 DIAGNOSIS — D509 Iron deficiency anemia, unspecified: Secondary | ICD-10-CM | POA: Diagnosis not present

## 2019-10-03 DIAGNOSIS — N2581 Secondary hyperparathyroidism of renal origin: Secondary | ICD-10-CM | POA: Diagnosis not present

## 2019-10-03 DIAGNOSIS — D631 Anemia in chronic kidney disease: Secondary | ICD-10-CM | POA: Diagnosis not present

## 2019-10-03 DIAGNOSIS — D689 Coagulation defect, unspecified: Secondary | ICD-10-CM | POA: Diagnosis not present

## 2019-10-03 DIAGNOSIS — E8770 Fluid overload, unspecified: Secondary | ICD-10-CM | POA: Diagnosis not present

## 2019-10-03 DIAGNOSIS — E876 Hypokalemia: Secondary | ICD-10-CM | POA: Diagnosis not present

## 2019-10-04 ENCOUNTER — Telehealth: Payer: Self-pay

## 2019-10-04 DIAGNOSIS — G894 Chronic pain syndrome: Secondary | ICD-10-CM | POA: Diagnosis not present

## 2019-10-04 DIAGNOSIS — M4726 Other spondylosis with radiculopathy, lumbar region: Secondary | ICD-10-CM | POA: Diagnosis not present

## 2019-10-04 DIAGNOSIS — I5043 Acute on chronic combined systolic (congestive) and diastolic (congestive) heart failure: Secondary | ICD-10-CM | POA: Diagnosis not present

## 2019-10-04 DIAGNOSIS — E785 Hyperlipidemia, unspecified: Secondary | ICD-10-CM | POA: Diagnosis not present

## 2019-10-04 DIAGNOSIS — Z86010 Personal history of colonic polyps: Secondary | ICD-10-CM | POA: Diagnosis not present

## 2019-10-04 DIAGNOSIS — Z87891 Personal history of nicotine dependence: Secondary | ICD-10-CM | POA: Diagnosis not present

## 2019-10-04 DIAGNOSIS — J9611 Chronic respiratory failure with hypoxia: Secondary | ICD-10-CM | POA: Diagnosis not present

## 2019-10-04 DIAGNOSIS — D631 Anemia in chronic kidney disease: Secondary | ICD-10-CM | POA: Diagnosis not present

## 2019-10-04 DIAGNOSIS — M48061 Spinal stenosis, lumbar region without neurogenic claudication: Secondary | ICD-10-CM | POA: Diagnosis not present

## 2019-10-04 DIAGNOSIS — I495 Sick sinus syndrome: Secondary | ICD-10-CM | POA: Diagnosis not present

## 2019-10-04 DIAGNOSIS — Z8673 Personal history of transient ischemic attack (TIA), and cerebral infarction without residual deficits: Secondary | ICD-10-CM | POA: Diagnosis not present

## 2019-10-04 DIAGNOSIS — Z9181 History of falling: Secondary | ICD-10-CM | POA: Diagnosis not present

## 2019-10-04 DIAGNOSIS — M810 Age-related osteoporosis without current pathological fracture: Secondary | ICD-10-CM | POA: Diagnosis not present

## 2019-10-04 DIAGNOSIS — Z950 Presence of cardiac pacemaker: Secondary | ICD-10-CM | POA: Diagnosis not present

## 2019-10-04 DIAGNOSIS — Z992 Dependence on renal dialysis: Secondary | ICD-10-CM | POA: Diagnosis not present

## 2019-10-04 DIAGNOSIS — I429 Cardiomyopathy, unspecified: Secondary | ICD-10-CM | POA: Diagnosis not present

## 2019-10-04 DIAGNOSIS — I480 Paroxysmal atrial fibrillation: Secondary | ICD-10-CM | POA: Diagnosis not present

## 2019-10-04 DIAGNOSIS — I340 Nonrheumatic mitral (valve) insufficiency: Secondary | ICD-10-CM | POA: Diagnosis not present

## 2019-10-04 DIAGNOSIS — M5134 Other intervertebral disc degeneration, thoracic region: Secondary | ICD-10-CM | POA: Diagnosis not present

## 2019-10-04 DIAGNOSIS — I132 Hypertensive heart and chronic kidney disease with heart failure and with stage 5 chronic kidney disease, or end stage renal disease: Secondary | ICD-10-CM | POA: Diagnosis not present

## 2019-10-04 DIAGNOSIS — N186 End stage renal disease: Secondary | ICD-10-CM | POA: Diagnosis not present

## 2019-10-04 NOTE — Telephone Encounter (Signed)
Phone call placed to daughter, [name]. Spoke with [name] and with patient. Patient has requested to discontinue dialysis and transition to hospice services. Patient shared that she is tired and no longer wants to go to dialysis or pursue aggressive care. Daughter supportive of patient's decision. Education provided on process for Hospice evaluation. Will speak with Palliative NP

## 2019-10-04 NOTE — Telephone Encounter (Signed)
En[place] D[place] NP updated and in agreement. Will reach out to dialysis center to provide update and request order for hospice eval.

## 2019-10-04 NOTE — Telephone Encounter (Signed)
Received voicemail from daughter.  Valerie Terrell reports patient has discontinued dialysis treatment early x last 4 treatments due to intolerable pain.  Last treatment, she pulled lines out and disrupted other patients in treatment area.  Valerie Terrell was encouraged by Nephrologist team to stop dialysis treatments and transfer her care to Hospice. Valerie Terrell states patient does not want to stop treatment yet. She also reports patients "sun-downers" episodes are nightly and no one in the house is getting sleep. Melatonin does not seem to be working.  Valerie Terrell since patient wants to continue treatments, to have her dialyze as long as she can tolerate then stop for the day. Educated that decreased treatment times will cause increased SOB and eventually will possibly end with another visit in the ER/Hospital. Daughter voiced understanding. She does not want to give patient additional pain medication d/t confusion.   Valerie Terrell grateful for call/discussion, she will keep PCP posted on patient status.

## 2019-10-04 NOTE — Telephone Encounter (Signed)
Spoke with nurse at dialysis center to provide update on patient's decision to transition to hospice. Requested an order from nephrologist. Nurse to speak with MD and return call or fax order.

## 2019-10-05 DIAGNOSIS — E876 Hypokalemia: Secondary | ICD-10-CM | POA: Diagnosis not present

## 2019-10-05 DIAGNOSIS — D631 Anemia in chronic kidney disease: Secondary | ICD-10-CM | POA: Diagnosis not present

## 2019-10-05 DIAGNOSIS — Z992 Dependence on renal dialysis: Secondary | ICD-10-CM | POA: Diagnosis not present

## 2019-10-05 DIAGNOSIS — D509 Iron deficiency anemia, unspecified: Secondary | ICD-10-CM | POA: Diagnosis not present

## 2019-10-05 DIAGNOSIS — D689 Coagulation defect, unspecified: Secondary | ICD-10-CM | POA: Diagnosis not present

## 2019-10-05 DIAGNOSIS — N186 End stage renal disease: Secondary | ICD-10-CM | POA: Diagnosis not present

## 2019-10-05 DIAGNOSIS — N2581 Secondary hyperparathyroidism of renal origin: Secondary | ICD-10-CM | POA: Diagnosis not present

## 2019-10-05 DIAGNOSIS — E8770 Fluid overload, unspecified: Secondary | ICD-10-CM | POA: Diagnosis not present

## 2019-10-16 DIAGNOSIS — I120 Hypertensive chronic kidney disease with stage 5 chronic kidney disease or end stage renal disease: Secondary | ICD-10-CM | POA: Diagnosis not present

## 2019-10-16 DIAGNOSIS — Z992 Dependence on renal dialysis: Secondary | ICD-10-CM | POA: Diagnosis not present

## 2019-10-16 DIAGNOSIS — N186 End stage renal disease: Secondary | ICD-10-CM | POA: Diagnosis not present

## 2019-10-17 DEATH — deceased

## 2019-10-18 ENCOUNTER — Telehealth: Payer: Self-pay

## 2019-10-18 NOTE — Telephone Encounter (Signed)
Noted  

## 2019-10-18 NOTE — Telephone Encounter (Signed)
Spoke with patients daughter, [name] (LM on office VM).  Patient passed away on 2022-08-11, 10-17-2019.  [name] grateful for all assistance throughout the years from Dr. Anitra Lauth and staff.
